# Patient Record
Sex: Female | Born: 1950 | Race: Black or African American | Hispanic: No | Marital: Single | Smoking: Former smoker
Health system: Southern US, Community
[De-identification: ages and names within clinical notes are randomized; demographics above are authoritative.]

## PROBLEM LIST (undated history)

## (undated) DIAGNOSIS — Z5111 Encounter for antineoplastic chemotherapy: Secondary | ICD-10-CM

## (undated) DIAGNOSIS — G8929 Other chronic pain: Secondary | ICD-10-CM

## (undated) DIAGNOSIS — IMO0001 Reserved for inherently not codable concepts without codable children: Secondary | ICD-10-CM

## (undated) DIAGNOSIS — J189 Pneumonia, unspecified organism: Secondary | ICD-10-CM

## (undated) DIAGNOSIS — D332 Benign neoplasm of brain, unspecified: Secondary | ICD-10-CM

## (undated) DIAGNOSIS — F329 Major depressive disorder, single episode, unspecified: Secondary | ICD-10-CM

## (undated) DIAGNOSIS — R51 Headache: Secondary | ICD-10-CM

## (undated) DIAGNOSIS — I214 Non-ST elevation (NSTEMI) myocardial infarction: Secondary | ICD-10-CM

## (undated) DIAGNOSIS — R519 Headache, unspecified: Secondary | ICD-10-CM

## (undated) DIAGNOSIS — K219 Gastro-esophageal reflux disease without esophagitis: Secondary | ICD-10-CM

## (undated) DIAGNOSIS — C3490 Malignant neoplasm of unspecified part of unspecified bronchus or lung: Secondary | ICD-10-CM

## (undated) DIAGNOSIS — D649 Anemia, unspecified: Secondary | ICD-10-CM

## (undated) DIAGNOSIS — M1990 Unspecified osteoarthritis, unspecified site: Secondary | ICD-10-CM

## (undated) DIAGNOSIS — I2510 Atherosclerotic heart disease of native coronary artery without angina pectoris: Secondary | ICD-10-CM

## (undated) DIAGNOSIS — I10 Essential (primary) hypertension: Secondary | ICD-10-CM

## (undated) DIAGNOSIS — M25559 Pain in unspecified hip: Secondary | ICD-10-CM

## (undated) DIAGNOSIS — J449 Chronic obstructive pulmonary disease, unspecified: Secondary | ICD-10-CM

## (undated) DIAGNOSIS — Z9981 Dependence on supplemental oxygen: Secondary | ICD-10-CM

## (undated) HISTORY — DX: Unspecified osteoarthritis, unspecified site: M19.90

## (undated) HISTORY — DX: Encounter for antineoplastic chemotherapy: Z51.11

## (undated) HISTORY — PX: CHOLECYSTECTOMY: SHX55

## (undated) HISTORY — DX: Headache, unspecified: R51.9

## (undated) HISTORY — PX: COLONOSCOPY: SHX174

## (undated) HISTORY — DX: Other chronic pain: G89.29

## (undated) HISTORY — DX: Headache: R51

---

## 2001-03-06 ENCOUNTER — Encounter: Payer: Self-pay | Admitting: Internal Medicine

## 2001-03-06 ENCOUNTER — Emergency Department (HOSPITAL_COMMUNITY): Admission: EM | Admit: 2001-03-06 | Discharge: 2001-03-06 | Payer: Self-pay | Admitting: Internal Medicine

## 2001-03-09 ENCOUNTER — Encounter: Payer: Self-pay | Admitting: Internal Medicine

## 2001-03-09 ENCOUNTER — Ambulatory Visit (HOSPITAL_COMMUNITY): Admission: RE | Admit: 2001-03-09 | Discharge: 2001-03-09 | Payer: Self-pay | Admitting: Internal Medicine

## 2001-04-01 ENCOUNTER — Inpatient Hospital Stay (HOSPITAL_COMMUNITY): Admission: RE | Admit: 2001-04-01 | Discharge: 2001-04-04 | Payer: Self-pay | Admitting: General Surgery

## 2001-04-02 ENCOUNTER — Encounter: Payer: Self-pay | Admitting: General Surgery

## 2001-05-17 ENCOUNTER — Ambulatory Visit (HOSPITAL_COMMUNITY): Admission: RE | Admit: 2001-05-17 | Discharge: 2001-05-17 | Payer: Self-pay | Admitting: Family Medicine

## 2001-05-17 ENCOUNTER — Encounter: Payer: Self-pay | Admitting: Family Medicine

## 2001-07-24 ENCOUNTER — Emergency Department (HOSPITAL_COMMUNITY): Admission: EM | Admit: 2001-07-24 | Discharge: 2001-07-24 | Payer: Self-pay | Admitting: Emergency Medicine

## 2001-08-18 ENCOUNTER — Emergency Department (HOSPITAL_COMMUNITY): Admission: EM | Admit: 2001-08-18 | Discharge: 2001-08-18 | Payer: Self-pay | Admitting: Emergency Medicine

## 2002-02-01 ENCOUNTER — Emergency Department (HOSPITAL_COMMUNITY): Admission: EM | Admit: 2002-02-01 | Discharge: 2002-02-01 | Payer: Self-pay | Admitting: Emergency Medicine

## 2002-06-25 ENCOUNTER — Emergency Department (HOSPITAL_COMMUNITY): Admission: EM | Admit: 2002-06-25 | Discharge: 2002-06-25 | Payer: Self-pay | Admitting: Emergency Medicine

## 2002-06-25 ENCOUNTER — Encounter: Payer: Self-pay | Admitting: Emergency Medicine

## 2002-09-24 ENCOUNTER — Encounter: Payer: Self-pay | Admitting: *Deleted

## 2002-09-24 ENCOUNTER — Emergency Department (HOSPITAL_COMMUNITY): Admission: EM | Admit: 2002-09-24 | Discharge: 2002-09-24 | Payer: Self-pay | Admitting: *Deleted

## 2003-04-15 HISTORY — PX: TUMOR REMOVAL: SHX12

## 2003-06-12 ENCOUNTER — Emergency Department (HOSPITAL_COMMUNITY): Admission: EM | Admit: 2003-06-12 | Discharge: 2003-06-12 | Payer: Self-pay | Admitting: Emergency Medicine

## 2003-06-22 ENCOUNTER — Inpatient Hospital Stay (HOSPITAL_COMMUNITY): Admission: EM | Admit: 2003-06-22 | Discharge: 2003-06-25 | Payer: Self-pay | Admitting: Emergency Medicine

## 2003-07-10 ENCOUNTER — Ambulatory Visit (HOSPITAL_COMMUNITY): Admission: RE | Admit: 2003-07-10 | Discharge: 2003-07-10 | Payer: Self-pay | Admitting: Family Medicine

## 2004-09-22 ENCOUNTER — Emergency Department (HOSPITAL_COMMUNITY): Admission: EM | Admit: 2004-09-22 | Discharge: 2004-09-22 | Payer: Self-pay | Admitting: Emergency Medicine

## 2005-01-24 ENCOUNTER — Ambulatory Visit (HOSPITAL_COMMUNITY): Admission: RE | Admit: 2005-01-24 | Discharge: 2005-01-24 | Payer: Self-pay | Admitting: Family Medicine

## 2005-04-22 ENCOUNTER — Ambulatory Visit (HOSPITAL_COMMUNITY): Admission: RE | Admit: 2005-04-22 | Discharge: 2005-04-22 | Payer: Self-pay | Admitting: Family Medicine

## 2005-05-12 ENCOUNTER — Emergency Department (HOSPITAL_COMMUNITY): Admission: EM | Admit: 2005-05-12 | Discharge: 2005-05-12 | Payer: Self-pay | Admitting: Emergency Medicine

## 2005-06-19 ENCOUNTER — Ambulatory Visit (HOSPITAL_COMMUNITY): Admission: RE | Admit: 2005-06-19 | Discharge: 2005-06-19 | Payer: Self-pay | Admitting: Family Medicine

## 2006-02-10 ENCOUNTER — Emergency Department (HOSPITAL_COMMUNITY): Admission: EM | Admit: 2006-02-10 | Discharge: 2006-02-10 | Payer: Self-pay | Admitting: Emergency Medicine

## 2006-02-19 ENCOUNTER — Ambulatory Visit (HOSPITAL_COMMUNITY): Admission: RE | Admit: 2006-02-19 | Discharge: 2006-02-19 | Payer: Self-pay | Admitting: Gastroenterology

## 2006-02-19 ENCOUNTER — Ambulatory Visit: Payer: Self-pay | Admitting: Gastroenterology

## 2006-06-21 ENCOUNTER — Emergency Department (HOSPITAL_COMMUNITY): Admission: EM | Admit: 2006-06-21 | Discharge: 2006-06-21 | Payer: Self-pay | Admitting: Emergency Medicine

## 2006-10-14 ENCOUNTER — Ambulatory Visit (HOSPITAL_COMMUNITY): Admission: RE | Admit: 2006-10-14 | Discharge: 2006-10-14 | Payer: Self-pay | Admitting: Family Medicine

## 2007-01-24 ENCOUNTER — Emergency Department (HOSPITAL_COMMUNITY): Admission: EM | Admit: 2007-01-24 | Discharge: 2007-01-24 | Payer: Self-pay | Admitting: Emergency Medicine

## 2007-03-27 ENCOUNTER — Emergency Department (HOSPITAL_COMMUNITY): Admission: EM | Admit: 2007-03-27 | Discharge: 2007-03-28 | Payer: Self-pay | Admitting: Emergency Medicine

## 2007-07-07 ENCOUNTER — Ambulatory Visit (HOSPITAL_COMMUNITY): Admission: RE | Admit: 2007-07-07 | Discharge: 2007-07-07 | Payer: Self-pay | Admitting: Family Medicine

## 2007-11-08 ENCOUNTER — Ambulatory Visit (HOSPITAL_COMMUNITY): Admission: RE | Admit: 2007-11-08 | Discharge: 2007-11-08 | Payer: Self-pay | Admitting: Family Medicine

## 2007-11-16 ENCOUNTER — Ambulatory Visit (HOSPITAL_COMMUNITY): Admission: RE | Admit: 2007-11-16 | Discharge: 2007-11-16 | Payer: Self-pay | Admitting: Family Medicine

## 2007-12-21 ENCOUNTER — Other Ambulatory Visit: Payer: Self-pay

## 2007-12-21 ENCOUNTER — Inpatient Hospital Stay: Payer: Self-pay | Admitting: Internal Medicine

## 2008-06-06 ENCOUNTER — Ambulatory Visit (HOSPITAL_COMMUNITY): Admission: RE | Admit: 2008-06-06 | Discharge: 2008-06-06 | Payer: Self-pay | Admitting: Family Medicine

## 2008-08-15 ENCOUNTER — Ambulatory Visit (HOSPITAL_COMMUNITY): Admission: RE | Admit: 2008-08-15 | Discharge: 2008-08-15 | Payer: Self-pay | Admitting: Family Medicine

## 2008-09-19 ENCOUNTER — Ambulatory Visit: Payer: Self-pay

## 2009-04-27 ENCOUNTER — Emergency Department (HOSPITAL_COMMUNITY): Admission: EM | Admit: 2009-04-27 | Discharge: 2009-04-27 | Payer: Self-pay | Admitting: Emergency Medicine

## 2009-09-14 ENCOUNTER — Emergency Department (HOSPITAL_COMMUNITY): Admission: EM | Admit: 2009-09-14 | Discharge: 2009-09-14 | Payer: Self-pay | Admitting: Emergency Medicine

## 2010-05-05 ENCOUNTER — Encounter: Payer: Self-pay | Admitting: Family Medicine

## 2010-06-13 ENCOUNTER — Emergency Department (HOSPITAL_COMMUNITY): Payer: Medicare Other

## 2010-06-13 ENCOUNTER — Emergency Department (HOSPITAL_COMMUNITY)
Admission: EM | Admit: 2010-06-13 | Discharge: 2010-06-13 | Disposition: A | Discharge status: Home / Self Care | Payer: Medicare Other | Attending: Emergency Medicine | Admitting: Emergency Medicine

## 2010-06-13 DIAGNOSIS — Z79899 Other long term (current) drug therapy: Secondary | ICD-10-CM | POA: Insufficient documentation

## 2010-06-13 DIAGNOSIS — Z87891 Personal history of nicotine dependence: Secondary | ICD-10-CM | POA: Insufficient documentation

## 2010-06-13 DIAGNOSIS — I10 Essential (primary) hypertension: Secondary | ICD-10-CM | POA: Insufficient documentation

## 2010-06-13 DIAGNOSIS — R109 Unspecified abdominal pain: Secondary | ICD-10-CM | POA: Insufficient documentation

## 2010-06-13 DIAGNOSIS — J45909 Unspecified asthma, uncomplicated: Secondary | ICD-10-CM | POA: Insufficient documentation

## 2010-06-13 LAB — CBC
HCT: 40 % (ref 36.0–46.0)
Hemoglobin: 14 g/dL (ref 12.0–15.0)
MCHC: 35 g/dL (ref 30.0–36.0)
MCV: 81.6 fL (ref 78.0–100.0)
RDW: 13.9 % (ref 11.5–15.5)
WBC: 9.6 10*3/uL (ref 4.0–10.5)

## 2010-06-13 LAB — DIFFERENTIAL
Eosinophils Relative: 0 % (ref 0–5)
Lymphocytes Relative: 24 % (ref 12–46)
Lymphs Abs: 2.3 10*3/uL (ref 0.7–4.0)
Monocytes Absolute: 0.6 10*3/uL (ref 0.1–1.0)
Neutro Abs: 6.7 10*3/uL (ref 1.7–7.7)

## 2010-06-13 LAB — URINALYSIS, ROUTINE W REFLEX MICROSCOPIC
Bilirubin Urine: NEGATIVE
Ketones, ur: NEGATIVE mg/dL
Leukocytes, UA: NEGATIVE
Nitrite: NEGATIVE
Protein, ur: NEGATIVE mg/dL

## 2010-06-13 LAB — BASIC METABOLIC PANEL
CO2: 22 mEq/L (ref 19–32)
Calcium: 9.3 mg/dL (ref 8.4–10.5)
Chloride: 104 mEq/L (ref 96–112)
Creatinine, Ser: 0.86 mg/dL (ref 0.4–1.2)
Glucose, Bld: 136 mg/dL — ABNORMAL HIGH (ref 70–99)

## 2010-06-30 LAB — URINALYSIS, ROUTINE W REFLEX MICROSCOPIC
Bilirubin Urine: NEGATIVE
Glucose, UA: NEGATIVE mg/dL
Protein, ur: NEGATIVE mg/dL
Specific Gravity, Urine: 1.02 (ref 1.005–1.030)
Urobilinogen, UA: 0.2 mg/dL (ref 0.0–1.0)

## 2010-06-30 LAB — COMPREHENSIVE METABOLIC PANEL
ALT: 12 U/L (ref 0–35)
AST: 19 U/L (ref 0–37)
Albumin: 3.8 g/dL (ref 3.5–5.2)
Alkaline Phosphatase: 202 U/L — ABNORMAL HIGH (ref 39–117)
CO2: 25 mEq/L (ref 19–32)
Chloride: 109 mEq/L (ref 96–112)
Creatinine, Ser: 0.6 mg/dL (ref 0.4–1.2)
GFR calc Af Amer: >60 mL/min (ref >60)
GFR calc non Af Amer: >60 mL/min (ref >60)
Potassium: 3.8 mEq/L (ref 3.5–5.1)
Total Bilirubin: 0.6 mg/dL (ref 0.3–1.2)

## 2010-06-30 LAB — URINE MICROSCOPIC-ADD ON

## 2010-06-30 LAB — DIFFERENTIAL
Basophils Absolute: 0.1 10*3/uL (ref 0.0–0.1)
Basophils Relative: 1 % (ref 0–1)
Eosinophils Absolute: 0.1 10*3/uL (ref 0.0–0.7)
Eosinophils Relative: 1 % (ref 0–5)
Monocytes Absolute: 0.4 10*3/uL (ref 0.1–1.0)

## 2010-06-30 LAB — CBC
MCV: 86.2 fL (ref 78.0–100.0)
Platelets: 208 10*3/uL (ref 150–400)
RBC: 4.73 MIL/uL (ref 3.87–5.11)
WBC: 9.5 10*3/uL (ref 4.0–10.5)

## 2010-06-30 LAB — LIPASE, BLOOD: Lipase: 12 U/L (ref 11–59)

## 2010-07-01 LAB — URINALYSIS, ROUTINE W REFLEX MICROSCOPIC
Bilirubin Urine: NEGATIVE
Glucose, UA: NEGATIVE mg/dL
Ketones, ur: NEGATIVE mg/dL
Leukocytes, UA: NEGATIVE
Protein, ur: NEGATIVE mg/dL
pH: 5.5 (ref 5.0–8.0)

## 2010-07-01 LAB — URINE MICROSCOPIC-ADD ON

## 2010-08-30 NOTE — H&P (Signed)
NAMEANNALISA, COLONNA                         ACCOUNT NO.:  [id number]   MEDICAL RECORD NO.:  [id number]                   PATIENT TYPE:  INP   LOCATION:  A223                                 FACILITY:  [facility]   PHYSICIAN:  Tesfaye D. Felecia Shelling, M.D.              DATE OF BIRTH:  09/27/1950   DATE OF ADMISSION:  06/22/2003  DATE OF DISCHARGE:                                HISTORY & PHYSICAL   CHIEF COMPLAINT:  1. Cough.  2. Shortness of breath.  3. Wheezing.   HISTORY OF PRESENT ILLNESS:  This is a 60 year old female patient who  presented to the emergency room with the above complaints.  The patient was  in her usual state of health until three days back when she developed cough  and congestion.  This was followed by shortness of breath and wheezing.  The  patient had also intermittent fever.  He symptoms progressively got worse.  The patient started having soreness in her chest and abdomen from recurrent  cough.  She was then brought to the emergency room where she was evaluated.  The patient was found to have severe hypoxemia.  Her chest x-ray showed some  atelectasis and sign of bronchitis.  The patient is a smoker for the last 28  hours. However, she has never been diagnosed as a case of emphysema or  chronic obstructive pulmonary disease.   REVIEW OF SYSTEMS:  The patient has a headache, generalized weakness, nausea  and abdominal pain.  No chest pain, palpitations, dysuria, urgency or  frequency of urination.  No leg edema.   PAST MEDICAL HISTORY:  1. 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  The patient does not have knowledge of the detail of     the condition.  However, she was told it to be a benign tumor.  2. History of cholecystectomy in 2002.   CURRENT MEDICATIONS:  The patient is taking Vicodin one tablet p.o. 4h.  p.r.n. for pain.   SOCIAL HISTORY:  The patient is a Location manager.  She is single.  She has  two  children.  The patient reports smoking about 1 to 1/2 packs of  cigarettes for the last 28 years.  No history of alcohol, or substance  abuse.   PHYSICAL EXAMINATION:  GENERAL:  The patient is alert, awake, acutely sick  looking.  VITAL SIGNS:  Blood pressure 105/63, pulse 109, respiratory rate 28,  temperature 98.2 degrees Fahrenheit.  HEENT:  Pupils are equal and reactive.  NECK:  Supple.  CHEST:  Poor air entry, bilateral expiratory wheezes and rhonchi.  There are  a few crackles at the bases.  CARDIOVASCULAR:  First and second heart sound is heart.  Regular, and  tachycardia.  ABDOMEN:  Soft and relaxed.  Bowel sounds positive.  No masses, no  organomegaly.  EXTREMITIES:  No leg edema.  LABORATORY DATA ON ADMISSION:  ABG on room air:  pH 7.4, pCO2 34.7, pO2 63,  bicarbonate 22.  Oxygen saturation 93.7.  CBC:  WBC 3.4, hemoglobin 12.7,  hematocrit 37.2, and platelets 172,000.  Sodium 132.  Potassium 3.8.  Chloride 107.  Carbon dioxide 26, glucose 93, BUN 7, creatinine 0.7, and  calcium 8.7.   ASSESSMENT:  This is a 60 year old female patient who is a smoker, who  presented with cough, shortness of breath, wheezing and generalized body  pain.  The patient does have significant hypoxemia on her blood gas.  She  has no leukocytosis, and her electrolytes are normal.  The patient probably  has acute bronchitis versus chronic obstructive pulmonary disease with  exacerbation.  This could have been exacerbated by her viral syndrome.   PLAN:  1. We will continue the patient on nebulizer treatment.  2. Would continue oxygen, two liters nasal cannula.  3. Would give the patient IV Solu-Medrol and IV antibiotics.     ___________________________________________                                         Eustaquio Maize Felecia Shelling, M.D.   TDF/MEDQ  D:  06/22/2003  T:  06/22/2003  Job:  [id number]

## 2010-08-30 NOTE — Op Note (Signed)
[facility]  Patient:    Courtney Zuniga, Courtney Zuniga Visit Number: [id number] MRN: [id number]          Service Type: SUR Location: 3A A326 01 Attending Physician:  Dessa Phi Dictated by:   Elpidio Anis, M.D. Admit Date:  04/01/2001 Discharge Date: 04/04/2001   CC:         [facility], [place], [place]   Operative Report  PREOPERATIVE DIAGNOSIS:  Chronic cholecystitis.  POSTOPERATIVE DIAGNOSES:  Chronic cholecystitis, multiple uterine fibroid.  PROCEDURE:  Laparoscopic cholecystectomy.  SURGEON:  Elpidio Anis, M.D.  ANESTHESIA:  General anesthesia.  DESCRIPTION OF PROCEDURE:  Under general anesthesia, the patients abdomen was prepped and draped in a sterile field.  Supraumbilical midline incision was made.  Veress needle was inserted uneventfully.  Abdomen was insufflated with 3 L of CO2.  Using a Visiport guide, a 10-mm port was placed.  Laparoscope was placed uneventfully and gallbladder was visualized.  Patient was positioned in reversed Trendelenburg position.  The right upper quadrant incisions were made and a 10-mm port and two 5-mm ports were placed under videoscopic guidance. Gallbladder was grasped and was positioned so as to gain access to the cystic duct.  Cystic duct was dissected without difficulty.  It was clipped with four clips and divided.  Cystic artery was dissected, clipped with three clips and divided.  Using electrocautery, gallbladder was separated from the infrahepatic bed without difficulty.  Gallbladder was grasped and retrieved. Irrigation was carried out.  There was no bile leak and no bleeding from the bed of the gallbladder.  The patient was placed in deep Trendelenburg position.  Evaluation of the pelvis revealed enlarged, multilobulated uterus with inflammation and adhesions to the surface of the uterus from massive fibroids.  Ovaries appeared to be normal.  These fibroids were probably because of some of  her pelvic pain.  Patient was placed in regular position. CO2 was allowed to escape from the abdomen and the ports were removed.  The incisions were closed using 0 Dexon on the fascia and staples on the skin. She was awakened from anesthesia uneventfully, transferred to a bed and taken to the postanesthetic care unit. Dictated by:   Elpidio Anis, M.D.  Attending Physician:  Dessa Phi DD:  04/03/01 TD:  04/05/01 Job: [id number]

## 2010-08-30 NOTE — Discharge Summary (Signed)
Courtney Zuniga, Courtney Zuniga                         ACCOUNT NO.:  [id number]   MEDICAL RECORD NO.:  [id number]                   PATIENT TYPE:  INP   LOCATION:  A311                                 FACILITY:  [facility]   PHYSICIAN:  Hanley Hays. Dechurch, M.D.           DATE OF BIRTH:  [birth year]   DATE OF ADMISSION:  06/22/2003  DATE OF DISCHARGE:  06/25/2003                                 DISCHARGE SUMMARY   DIAGNOSES:  1. Asthma exacerbation.  2. Probable viral syndrome.  3. Blepharospasm.  4. Status post brain tumor excision, January 2005, details __________.  5. Status post endonasal transsphenoidal resection of pituitary tumor,     May 15, 2003.  6. Tobacco abuse.  7. Decreased TSH, free T4; free T4 pending.   DISPOSITION:  Patient discharged to home.   MEDICATIONS:  1. Prednisone 40 mg x2 days, 20 mg x3 days, 10 mg x3 days then stop.  2. Advair 250/50 mcg 1 puff b.i.d.  3. Mucinex DM 600 mg t.i.d. x1 week then p.r.n.  4. Ceftin 250 mg b.i.d. until gone to complete a 7-day course.  5. The patient is instructed to continue her ibuprofen and Vicodin as needed     for pain.   FOLLOWUP:  She is to follow up with Dr. Vickey Huger DonDiego later this  week, number is provided for her to schedule an appointment; follow up with  Dr. Rennis Harding at [facility] as scheduled.   HOSPITAL COURSE:  [age]-year-old African American female who presented  to the emergency room complaining of progressive shortness of breath and  wheezing as well as pain in her chest.  She was noted to be severely  hypoxemic with a PO2 of 63% on room air and interesting, it was 59% on 2 L.  She was in moderate respiratory distress.  She had no evidence of  hypercapnia or change in mental status.  White count was slightly low at 3.4  with normal differential.  Electrolytes were within normal limits.  The  patient had an elevated D-dimer of 1.3.  She had a history of a recent brain  tumor  excision.  A CT scan was performed to rule out PE, which did not show  PE, but only changes consistent with her COPD and some bibasilar  atelectasis.  Over the course of the next 48 hours, the patient had  significant improvement in her respiratory status.  The patient had returned  to her baseline by day 3 and it was felt that she could complete her  management as an outpatient.  The patient was also noted to have persistent  blepharospasm.  She should be referred as an outpatient for question if she  is a good candidate for Botox injection, as this is quite bothersome to her  vision.  The patient also is supposed to have a followup with Dr. Rennis Harding  within 4 weeks; this going to need to  be followed as well.  She is being  discharged to home in stable condition.  At the time of discharge, she is  alert and pleasant.  Blood pressure is 132/72, pulse of 76 and regular,  respirations are unlabored.  O2 saturations are pending on room air.  The  lungs are diminished but no wheezing is noted, no rales or  rhonchi.  Heart is regular.  No murmur.  The abdomen is soft, flat and  nontender.  Extremities without clubbing, cyanosis or edema.  She has noted  blepharospasm of the left eye but no other changes on her neurologic exam.  She is being discharged to home with a followup as noted above.     ___________________________________________                                         Hanley Hays. Josefine Class, M.D.   FED/MEDQ  D:  06/25/2003  T:  06/26/2003  Job:  [id number]   cc:   Melvyn Novas, MD  Fax: [phone]   [facility] Dr. Rennis Harding

## 2010-08-30 NOTE — H&P (Signed)
[facility]  Patient:    Courtney Zuniga, Courtney Zuniga Visit Number: [id number] MRN: [id number]          Service Type: SUR Location: 3A A326 01 Attending Physician:  Dessa Phi Dictated by:   Elpidio Anis, M.D. Admit Date:  04/01/2001                           History and Physical  HISTORY OF PRESENT ILLNESS:  A 60 year old female with a history of recurrent abdominal pain for greater than three months.  She has had heartburn and epigastric pain which radiates through to her back.  She has had nausea with vomiting.  She has had a 12-pound weight loss.  She has had episodes of diarrhea over the past three weeks.  Patient has known gallstones.  She was scheduled to have elective cholecystectomy but had severe pain which is uncontrolled by oral analgesics.  She is admitted and will have cholecystectomy in the morning.  PAST MEDICAL HISTORY:  She has no chronic illness other than as noted above. She takes no medications.  She has had no prior surgery.  FAMILY HISTORY:  Positive for cancer of the ovaries in her mother.  REVIEW OF SYSTEMS:  She has had weight loss, increased dietary postprandial discomfort, and GI symptoms.  She is postmenopausal since age 56.  She has occasional episodes of leg and foot swelling.  PHYSICAL EXAMINATION:  VITAL SIGNS:  Blood pressure 124/86, pulse 90, respirations 18, weight 174 pounds.  HEENT:  Unremarkable.  NECK:  Supple without JVD or bruit.  CHEST:  Clear to auscultation.  No rales, rubs, or wheezes.  HEART:  Regular rate and rhythm without murmur, gallop, or rub.  ABDOMEN:  Soft with epigastric and right upper quadrant tenderness.  She also has suprapubic tenderness.  RECTAL:  Normal.  No masses are felt.  EXTREMITIES:  No cyanosis, clubbing, or edema.  NEUROLOGIC:  No focal, motor, sensory, or cerebellar deficits.  IMPRESSION: 1. Cholelithiasis with cholecystitis. 2. Lower pelvic pain.  PLAN:  Diagnostic  laparoscopy with laparoscopic cholecystectomy. Dictated by:   Elpidio Anis, M.D. Attending Physician:  Dessa Phi DD:  04/01/01 TD:  04/01/01 Job: [id number]

## 2010-08-30 NOTE — Op Note (Signed)
Courtney Zuniga, Courtney Zuniga             ACCOUNT NO.:  [id number]   MEDICAL RECORD NO.:  [id number]          PATIENT TYPE:  AMB   LOCATION:  DAY                           FACILITY:  [facility]   PHYSICIAN:  Kassie Mends, M.D.      DATE OF BIRTH:  1950/05/05   DATE OF PROCEDURE:  02/19/2006  DATE OF DISCHARGE:                                 OPERATIVE REPORT   PROCEDURE:  Colonoscopy.   INDICATION FOR EXAM:  Ms. Ratledge is a 60 year old female who presents for  average risk colon cancer screening.   FINDINGS:  Small internal hemorrhoids.  Tortuous sigmoid colon.  Otherwise,  normal colon without evidence of polyps, diverticula, masses, inflammatory  changes, or vascular ectasias.   RECOMMENDATIONS:  1. High fiber diet.  Handout given on high fiber diet and management of      hemorrhoids.  2. Follow up with Dr. Janna Arch.  3. Screening colonoscopy in ten years.   MEDICATIONS:  1. Demerol 50 mg IV.  2. Versed 5 mg IV.   PROCEDURE TECHNIQUE:  Physical exam was performed.  Informed consent was  obtained from the patient after explaining the benefits, risks and  alternatives to the procedure.  The patient was connected to the monitor and  placed in the left lateral position.  Continuous oxygen was provided by  nasal cannula and IV medicine administered through an indwelling cannula.  After ministration of sedation and rectal exam, the patient's rectum was  intubated and the scope was advanced under direct visualization to the  cecum.  The scope was subsequently removed slowly by carefully examining the  color, texture, anatomy, and integrity of the mucosa on the way out.  The  patient was recovered in the endoscopy suite and discharged to home in  satisfactory condition.      Kassie Mends, M.D.  Electronically Signed     SM/MEDQ  D:  02/19/2006  T:  02/19/2006  Job:  [id number]   cc:   Melvyn Novas, MD  Fax: [phone]

## 2010-08-30 NOTE — Discharge Summary (Signed)
[facility]  Patient:    Courtney Zuniga, Courtney Zuniga Visit Number: [id number] MRN: [id number]          Service Type: OUT Location: RAD Attending Physician:  Elnora Morrison Dictated by:   Elpidio Anis, M.D. Adm. Date:  04/01/01 Disc. Date: 04/04/01                             Discharge Summary  DISCHARGE DIAGNOSES: 1. Chronic cholecystectomy. 2. Multiple uterine fibroids.  PROCEDURE:  Laparoscopic cholecystectomy April 03, 2001.  DISPOSITION:  The patient is discharged home.  CONDITION ON DISCHARGE:  Stable and satisfactory.  DISCHARGE MEDICATIONS:  Tylox 1 every four hours as needed for pain.  FOLLOW-UP:  The patient is scheduled to be seen in the office on April 12, 2001.  HISTORY OF PRESENT ILLNESS:  The patient is a 60 year old female with a history of recurrent abdominal pain for greater than three months with episodes of heartburn, epigastric pain which radiate through to her back, nausea with vomiting, and a 12-pound weight loss.  She had a history of gallstones.  The patient was having severe pain and was admitted for treatment.  It was felt that she would need to have cholecystectomy.  PAST MEDICAL HISTORY:  Otherwise unremarkable.  PHYSICAL EXAMINATION:  Epigastric and right upper quadrant tenderness with some suprapubic tenderness.  HOSPITAL COURSE:  She was scheduled for surgery, but we could not get her surgery done initially.  EGD was done because of recurrent upper symptoms. EGD showed acute and chronic gastritis.  Later that day, laparoscopic cholecystectomy was done.  She had chronic adhesions to the gallbladder.  She also had an enlarged uterus which was multilobulated.  This was due to multiple leiomyomas.  Her ovaries appeared to be normal.  She did well in the postoperative period, had no difficulty, and was discharged in asymptomatic condition on the first postoperative day. Dictated by:   Elpidio Anis, M.D. Attending  Physician:  Elnora Morrison DD:  05/15/01 TD:  05/16/01 Job: [id number]

## 2010-10-19 ENCOUNTER — Emergency Department (HOSPITAL_COMMUNITY)
Admission: EM | Admit: 2010-10-19 | Discharge: 2010-10-19 | Disposition: A | Discharge status: Home / Self Care | Payer: Medicare Other | Attending: Emergency Medicine | Admitting: Emergency Medicine

## 2010-10-19 ENCOUNTER — Emergency Department (HOSPITAL_COMMUNITY): Payer: Medicare Other

## 2010-10-19 ENCOUNTER — Encounter: Payer: Self-pay | Admitting: Emergency Medicine

## 2010-10-19 DIAGNOSIS — R110 Nausea: Secondary | ICD-10-CM | POA: Insufficient documentation

## 2010-10-19 DIAGNOSIS — R51 Headache: Secondary | ICD-10-CM | POA: Insufficient documentation

## 2010-10-19 DIAGNOSIS — F17200 Nicotine dependence, unspecified, uncomplicated: Secondary | ICD-10-CM | POA: Insufficient documentation

## 2010-10-19 HISTORY — DX: Benign neoplasm of brain, unspecified: D33.2

## 2010-10-19 LAB — POCT I-STAT, CHEM 8
BUN: 10 mg/dL (ref 6–23)
Hemoglobin: 13.9 g/dL (ref 12.0–15.0)
Potassium: 3.4 mEq/L — ABNORMAL LOW (ref 3.5–5.1)
Sodium: 145 mEq/L (ref 135–145)
TCO2: 23 mmol/L (ref 0–100)

## 2010-10-19 MED ORDER — MORPHINE SULFATE 4 MG/ML IJ SOLN
4.0000 mg | Freq: Once | INTRAMUSCULAR | Status: AC
  Administered 2010-10-19: 4 mg via INTRAVENOUS
  Filled 2010-10-19: qty 1

## 2010-10-19 MED ORDER — BUTALBITAL-ASA-CAFFEINE 50-325-40 MG PO CAPS: 1.0000 | ORAL_CAPSULE | Freq: Two times a day (BID) | ORAL | Status: AC | PRN

## 2010-10-19 MED ORDER — METOCLOPRAMIDE HCL 5 MG/ML IJ SOLN
10.0000 mg | Freq: Once | INTRAMUSCULAR | Status: AC
  Administered 2010-10-19: 10 mg via INTRAVENOUS
  Filled 2010-10-19: qty 2

## 2010-10-19 NOTE — ED Notes (Signed)
Pt stating pain free, restingin bed in room drinking a sprite

## 2010-10-19 NOTE — ED Notes (Addendum)
Headache x 1 week. Nausea x 2 wks. No s/s of stroke present. States pcp took pt off bp med x 1 year ago

## 2010-10-19 NOTE — ED Provider Notes (Signed)
History     Chief Complaint  Patient presents with  . Headache   HPI Comments: Pt reports h/o brain tumor that was removed and has had occasional similar headaches since; they have been worsening x 8 days, more frequent and severe. States she wakes up through the night with a "gripping pain," also describes it as sharp, starting in her forehead and then moves to the back of her head. Pain is worse with loud noise and light. +nausea but no other complaints. No numbness, weakness, tingling, dizziness, facial droop, or vision change. No cp, sob, f/c, cough, congestion, abd pain, or v/d.   Patient is a 60 y.o. female presenting with headaches. The history is provided by the patient.  Headache  This is a recurrent problem. The current episode started more than 1 week ago. The problem has been gradually worsening. The headache is associated with loud noise and bright light. The pain is located in the frontal and occipital region. The quality of the pain is described as sharp. The pain is severe. Radiates to: ears. Associated symptoms include nausea. Pertinent negatives include no fever, no malaise/fatigue, no chest pressure, no palpitations, no syncope, no shortness of breath and no vomiting. Associated symptoms comments: Hand numbness.    Past Medical History  Diagnosis Date  . Brain tumor (benign)     Past Surgical History  Procedure Date  . Cholecystectomy   . Tumor removal     History reviewed. No pertinent family history.  History  Substance Use Topics  . Smoking status: Current Everyday Smoker  . Smokeless tobacco: Not on file  . Alcohol Use: No    OB History    Grav Para Term Preterm Abortions TAB SAB Ect Mult Living                  Review of Systems  Constitutional: Negative for fever and malaise/fatigue.  Respiratory: Negative for shortness of breath.   Cardiovascular: Negative for palpitations and syncope.  Gastrointestinal: Positive for nausea. Negative for vomiting.   Neurological: Positive for headaches.  All other systems reviewed and are negative.    Physical Exam  BP 150/100  Pulse 66  Temp(Src) 98.2 F (36.8 C) (Oral)  Resp 20  Ht 5\' 7"  (1.702 m)  Wt 160 lb (72.576 kg)  BMI 25.06 kg/m2  SpO2 99%  Physical Exam  Nursing note and vitals reviewed. Constitutional: She is oriented to person, place, and time. She appears well-developed and well-nourished. No distress.       Calm  HENT:  Head: Normocephalic and atraumatic.  Eyes: Conjunctivae are normal. No scleral icterus.       Normal appearance  Neck: Neck supple.  Cardiovascular: Normal rate, regular rhythm and normal heart sounds.   Pulmonary/Chest: Effort normal and breath sounds normal.  Abdominal: Soft. There is no tenderness.  Musculoskeletal: She exhibits no edema and no tenderness.  Neurological: She is alert and oriented to person, place, and time. She has normal strength. No cranial nerve deficit or sensory deficit. She exhibits normal muscle tone. Coordination normal.       Episodic  facial muscle spasm; no facial droop; Normal speech; bilateral leg strength 5/5; bilateral upper and lower extremity normal light touch sensation;  Skin:       Color normal  Psychiatric: She has a normal mood and affect.    ED Course  Procedures  MDM Pt is feeling better after treatment.  Labs and xrays without acute changes.  No sign of  tumor recurrence.  Possible migraine headache.  No sign of meningitis, SAH.  I personally performed the services described in this documentation, which was scribed in my presence.  The recorded information has been reviewed and considered.    Celene Kras, MD 10/19/10 [phone]

## 2010-10-19 NOTE — ED Notes (Signed)
Pt restingin bed in room lights turned off call light at hand

## 2010-12-28 ENCOUNTER — Emergency Department (HOSPITAL_COMMUNITY): Payer: Medicare Other

## 2010-12-28 ENCOUNTER — Encounter (HOSPITAL_COMMUNITY): Payer: Self-pay | Admitting: *Deleted

## 2010-12-28 ENCOUNTER — Emergency Department (HOSPITAL_COMMUNITY)
Admission: EM | Admit: 2010-12-28 | Discharge: 2010-12-28 | Disposition: A | Discharge status: Home / Self Care | Payer: Medicare Other | Attending: Emergency Medicine | Admitting: Emergency Medicine

## 2010-12-28 DIAGNOSIS — J45909 Unspecified asthma, uncomplicated: Secondary | ICD-10-CM | POA: Insufficient documentation

## 2010-12-28 DIAGNOSIS — M25551 Pain in right hip: Secondary | ICD-10-CM

## 2010-12-28 DIAGNOSIS — M25559 Pain in unspecified hip: Secondary | ICD-10-CM | POA: Insufficient documentation

## 2010-12-28 DIAGNOSIS — R109 Unspecified abdominal pain: Secondary | ICD-10-CM | POA: Insufficient documentation

## 2010-12-28 DIAGNOSIS — F17200 Nicotine dependence, unspecified, uncomplicated: Secondary | ICD-10-CM | POA: Insufficient documentation

## 2010-12-28 DIAGNOSIS — I10 Essential (primary) hypertension: Secondary | ICD-10-CM | POA: Insufficient documentation

## 2010-12-28 HISTORY — DX: Essential (primary) hypertension: I10

## 2010-12-28 MED ORDER — HYDROCODONE-ACETAMINOPHEN 5-325 MG PO TABS: 1.0000 | ORAL_TABLET | ORAL | Status: AC | PRN

## 2010-12-28 MED ORDER — NAPROXEN 500 MG PO TABS: 500.0000 mg | ORAL_TABLET | Freq: Two times a day (BID) | ORAL | Status: DC

## 2010-12-28 MED ORDER — PREDNISONE 20 MG PO TABS: ORAL_TABLET | ORAL | Status: AC

## 2010-12-28 MED ORDER — HYDROMORPHONE HCL 2 MG/ML IJ SOLN
2.0000 mg | Freq: Once | INTRAMUSCULAR | Status: AC
  Administered 2010-12-28: 2 mg via INTRAMUSCULAR
  Filled 2010-12-28: qty 1

## 2010-12-28 NOTE — ED Notes (Signed)
Pt c/o pain in her right hip  X 1 week. Pt states that she has had problems with her hip prior to this episode. Pt c/o pain with movement and putting pressure on her leg. Alert and oriented x 3. Skin warm and dry. Pt grimacing with pain with movement. Able to bear weight but c/o increasing pain.

## 2010-12-28 NOTE — ED Provider Notes (Signed)
History     CSN: [id number] Arrival date & time: 12/28/2010  4:49 PM Scribed for Donnetta Hutching, MD, the patient was seen in room APA11/APA11. This chart was scribed by Katha Cabal. This patient's care was started at 5:21PM.     Chief Complaint  Patient presents with  . Hip Pain    HPI  Courtney Zuniga is a 60 y.o. female who presents to the Emergency Department "shooting" right inguinal area and right lateral hip pain that waxes and wanes since onset 5 years ago but has gotten worse in the last week.  Pain is increased with movement and standing.  Denies injury and fall. Pain is minimally  alleviated by Ibuprofen.   Patient states that she had to retire early because of hip pain.  Patient has not seen a bone specialist.   PCP [facility]   PAST MEDICAL HISTORY:  Past Medical History  Diagnosis Date  . Brain tumor (benign)   . Hypertension   . Asthma     PAST SURGICAL HISTORY:  Past Surgical History  Procedure Date  . Cholecystectomy   . Tumor removal     MEDICATIONS:  Previous Medications   IBUPROFEN (ADVIL,MOTRIN) 800 MG TABLET    Take 800 mg by mouth every 8 (eight) hours as needed.       ALLERGIES:  Allergies as of 12/28/2010  . (No Known Allergies)     FAMILY HISTORY:  History reviewed. No pertinent family history.   SOCIAL HISTORY: History   Social History  . Marital Status: Single    Spouse Name: N/A    Number of Children: N/A  . Years of Education: N/A   Social History Main Topics  . Smoking status: Current Everyday Smoker -- 0.3 packs/day  . Smokeless tobacco: None  . Alcohol Use: No  . Drug Use: No  . Sexually Active:    Other Topics Concern  . None   Social History Narrative  . None    Review of Systems 10 Systems reviewed and are negative for acute change except as noted in the HPI.  Physical Exam    BP 123/68  Pulse 81  Temp(Src) 98.4 F (36.9 C) (Oral)  Resp 24  Ht 5\' 7"  (1.702 m)  Wt 153 lb (69.4 kg)  BMI  23.96 kg/m2  SpO2 97%  Physical Exam  Nursing note and vitals reviewed. Constitutional: She is oriented to person, place, and time. No distress.       Appearance consistent with age of record  HENT:  Head: Normocephalic and atraumatic.  Right Ear: External ear normal.  Left Ear: External ear normal.  Nose: Nose normal.  Mouth/Throat: Oropharynx is clear and moist.  Eyes: Conjunctivae are normal.  Neck: Neck supple.  Cardiovascular: Normal rate and regular rhythm.  Exam reveals no gallop and no friction rub.   No murmur heard. Pulmonary/Chest: Effort normal and breath sounds normal. She has no wheezes. She has no rhonchi. She has no rales. She exhibits no tenderness.  Abdominal: Soft. There is no tenderness.  Musculoskeletal: Normal range of motion.       right inguinal area and right lateral hip tenderness  Pain with ROM of hip     Neurological: She is alert and oriented to person, place, and time. No sensory deficit.  Skin: No rash noted.       Color normal  Psychiatric: She has a normal mood and affect.    ED Course  Procedures  OTHER DATA REVIEWED:  Nursing notes, vital signs, and past medical records reviewed.   DIAGNOSTIC STUDIES: Oxygen Saturation is 99% on room air, normal by my interpretation.     LABS / RADIOLOGY:  Dg Hip Complete Right  12/28/2010  *RADIOLOGY REPORT*  Clinical Data: Chronic right hip pain, worse this week  RIGHT HIP - COMPLETE 2+ VIEW  Comparison: 06/06/2008  Findings: Diffuse osseous demineralization. Mild narrowing of the right hip joint. Left hip joint and symmetric SI joints preserved. No pelvic calcifications question phleboliths versus uterine leiomyomata. Mild degenerative changes of pubic symphysis. No acute fracture, dislocation or bone destruction.  IMPRESSION: No acute osseous abnormalities. Mild degenerative changes right hip joint, similar to prior study.  Original Report Authenticated By: Lollie Marrow, M.D.    ED COURSE /  COORDINATION OF CARE:  5:25 PM  Will order pain control and review hip xray.  Patient is to follow up with a orthopedist.     Orders Placed This Encounter  Procedures  . DG Hip Complete Right  . Crutches    OZH:[id number] chronic right hip/groin pain;  Xray neg acute;  rx pain;  Refer to ortho   IMPRESSION: Diagnoses that have been ruled out:  Diagnoses that are still under consideration:  Final diagnoses:  Right hip pain    PLAN:  Home Referral Orthopedics The patient is to return the emergency department if there is any worsening of symptoms. I have reviewed the discharge instructions with the patient.    CONDITION ON DISCHARGE: Good    DISCHARGE MEDICATIONS: New Prescriptions   HYDROCODONE-ACETAMINOPHEN (NORCO) 5-325 MG PER TABLET    Take 1-2 tablets by mouth every 4 (four) hours as needed for pain.   NAPROXEN (NAPROSYN) 500 MG TABLET    Take 1 tablet (500 mg total) by mouth 2 (two) times daily.   PREDNISONE (DELTASONE) 20 MG TABLET    3 tabs po day one, then 2 po daily x 4 days     I personally performed the services described in this documentation, which was scribed in my presence. The recorded information has been reviewed and considered. No att. providers found           Donnetta Hutching, MD 12/30/10 [phone]

## 2010-12-28 NOTE — ED Notes (Signed)
Pt states she has been hurting in her right hip for a week. Pt denies injury. Pt states it feels like her hip is broken. Pt walked into the ED.

## 2011-01-09 ENCOUNTER — Encounter: Payer: Self-pay | Admitting: Orthopedic Surgery

## 2011-01-09 ENCOUNTER — Ambulatory Visit (INDEPENDENT_AMBULATORY_CARE_PROVIDER_SITE_OTHER): Payer: Medicare Other | Admitting: Orthopedic Surgery

## 2011-01-09 VITALS — BP 140/90 | Ht 67.0 in | Wt 153.0 lb

## 2011-01-09 DIAGNOSIS — M5126 Other intervertebral disc displacement, lumbar region: Secondary | ICD-10-CM

## 2011-01-09 MED ORDER — GABAPENTIN 100 MG PO CAPS: 100.0000 mg | ORAL_CAPSULE | Freq: Three times a day (TID) | ORAL | Status: DC

## 2011-01-09 MED ORDER — HYDROCODONE-ACETAMINOPHEN 5-325 MG PO TABS: 1.0000 | ORAL_TABLET | ORAL | Status: DC | PRN

## 2011-01-09 MED ORDER — IBUPROFEN 800 MG PO TABS: 800.0000 mg | ORAL_TABLET | Freq: Three times a day (TID) | ORAL | Status: DC | PRN

## 2011-01-09 NOTE — Patient Instructions (Signed)
Diagnosis herniated disc.  Plan MRI lumbar spine.  Continue oral pain medications.

## 2011-01-09 NOTE — Progress Notes (Signed)
Chief complaint RIGHT hip and leg pain  This is a 60 year old female referred to as from the emergency room with a four-year history of pain in her RIGHT hip radiating down her RIGHT leg.  Her pain is severe.  She's had waxing and waning of symptoms otherwise for years no history of trauma.  She went to the emergency room after chart Sunday when her pain became so severe that she wanted to chop her leg off.  She denies any bowel or bladder dysfunction.  No weight loss fever or chills.  Past Medical History  Diagnosis Date  . Brain tumor (benign)   . Hypertension   . Asthma   . Chronic headaches     Past Surgical History  Procedure Date  . Cholecystectomy   . Tumor removal     History   Social History  . Marital Status: Single    Spouse Name: N/A    Number of Children: N/A  . Years of Education: 10th grade   Occupational History  . retired    Social History Main Topics  . Smoking status: Current Everyday Smoker -- 0.3 packs/day  . Smokeless tobacco: Not on file  . Alcohol Use: No  . Drug Use: Not on file  . Sexually Active: Not on file   Other Topics Concern  . Not on file   Social History Narrative  . No narrative on file    Examination her vital signs are recorded stable.  Her appearance is normal she has a small ectomorphic body habitus she is oriented x3 her mood and affect are normal she walks with a limp and with support  She's tender in the lumbar spine has a positive straight leg raise.  Lower extremities have normal range of motion, stability and strength skin is intact in both legs.  Pulses are good temperature is good in both lower extremities.  She has no lymphadenopathy in the lower extremities.  She has slight decreased sensation of the dorsum of the foot on the RIGHT normal on the LEFT.  She has no pathologic reflexes deep tendon reflexes are equal she has a positive Lasegue's sign a positive straight leg raise and a positive cross leg straight leg  raise  Balance normal  Imaging X-rays shows normal hip joint X-ray shows mild degenerative changes lumbar spine MRI ordered  Plan Anti-inflammatory, oral analgesic.  MRI spine didn't make referral as needed for epidural or surgery

## 2011-01-14 ENCOUNTER — Telehealth: Payer: Self-pay | Admitting: Orthopedic Surgery

## 2011-01-14 NOTE — Telephone Encounter (Signed)
Darel Hong from [facility] radiology said needs order faxed for patient, scheduled for MRI tomorrow 01/15/11 at 9:00am, thanks

## 2011-01-15 ENCOUNTER — Ambulatory Visit (HOSPITAL_COMMUNITY)
Admission: RE | Admit: 2011-01-15 | Discharge: 2011-01-15 | Disposition: A | Discharge status: Home / Self Care | Payer: Medicare Other | Source: Ambulatory Visit | Attending: Orthopedic Surgery | Admitting: Orthopedic Surgery

## 2011-01-15 ENCOUNTER — Telehealth: Payer: Self-pay | Admitting: Radiology

## 2011-01-15 DIAGNOSIS — M545 Low back pain, unspecified: Secondary | ICD-10-CM | POA: Insufficient documentation

## 2011-01-15 DIAGNOSIS — M25559 Pain in unspecified hip: Secondary | ICD-10-CM | POA: Insufficient documentation

## 2011-01-15 DIAGNOSIS — M5126 Other intervertebral disc displacement, lumbar region: Secondary | ICD-10-CM | POA: Insufficient documentation

## 2011-01-15 NOTE — Telephone Encounter (Signed)
Patient was precerted for MRI of L-spine. She has [employer].  Authorization #[id number] and it expires on 02-27-11.

## 2011-01-15 NOTE — Telephone Encounter (Signed)
done

## 2011-01-16 ENCOUNTER — Telehealth: Payer: Self-pay | Admitting: Orthopedic Surgery

## 2011-01-16 NOTE — Telephone Encounter (Signed)
Patient called, said had her MRI yesterday, 01/15/11, at [facility]. Has a follow up appointment scheduled 01/22/11. States still hurting, and asked about possible other or stronger medication.  Relayed that she will need to continue the current treatment until Dr. Romeo Apple reviews her results at her upcoming appointment.

## 2011-01-20 LAB — URINALYSIS, ROUTINE W REFLEX MICROSCOPIC
Leukocytes, UA: NEGATIVE
Nitrite: NEGATIVE
Specific Gravity, Urine: <1.005 — ABNORMAL LOW
Urobilinogen, UA: 0.2
pH: 6

## 2011-01-20 LAB — URINE MICROSCOPIC-ADD ON

## 2011-01-22 ENCOUNTER — Ambulatory Visit: Payer: Medicare Other | Admitting: Orthopedic Surgery

## 2011-01-23 LAB — URINALYSIS, ROUTINE W REFLEX MICROSCOPIC
Bilirubin Urine: NEGATIVE
Glucose, UA: NEGATIVE
Hgb urine dipstick: NEGATIVE
Protein, ur: NEGATIVE
Urobilinogen, UA: 0.2

## 2011-02-04 ENCOUNTER — Encounter: Payer: Self-pay | Admitting: Orthopedic Surgery

## 2011-02-04 ENCOUNTER — Telehealth: Payer: Self-pay | Admitting: Radiology

## 2011-02-04 ENCOUNTER — Ambulatory Visit (INDEPENDENT_AMBULATORY_CARE_PROVIDER_SITE_OTHER): Payer: Medicare Other | Admitting: Orthopedic Surgery

## 2011-02-04 VITALS — BP 130/90 | Ht 67.0 in | Wt 153.0 lb

## 2011-02-04 DIAGNOSIS — M5137 Other intervertebral disc degeneration, lumbosacral region: Secondary | ICD-10-CM

## 2011-02-04 DIAGNOSIS — M5136 Other intervertebral disc degeneration, lumbar region: Secondary | ICD-10-CM | POA: Insufficient documentation

## 2011-02-04 NOTE — Telephone Encounter (Signed)
I faxed a referral for this patient to Dr. Bethea for ESI injections at level L4-5. 

## 2011-02-04 NOTE — Patient Instructions (Addendum)
Medications for your back Hydrocodone Neurontin/gabapentin Ibuprofen   You will be schedule for back injections ( ESI @ L 4-5)

## 2011-02-04 NOTE — Progress Notes (Signed)
   Followup visit after MRI LS spine for continued RIGHT leg and hip pain.  Previous hip MRI results noted below.  Impression of recent MRI as noted below.  She is on ibuprofen, hydrocodone. A Neurontin, ran out, and she didn't refill it.  She comes in today on crutches still complaining of RIGHT leg and hip pain.  L3-4: Mild disc bulge without impingement. Mild left facet  arthropathy noted.  L4-5: Mild disc bulge noted without impingement. Mild facet  arthropathy noted.  L5-S1: Mild facet arthropathy noted, left greater than right. No  impingement.  IMPRESSION:  1. 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  2. Uterine leiomyomata.  1. Hip joint degenerative changes, right greater than left.  2. No MR findings for stress fracture or infection necrosis  Recommend epidural injection at L4, and 5. The MRI suggests, LEFT greater than RIGHT, disease, but her MRI of her hip does not impressive.  She is encouraged to take the Neurontin with the other medicines. Followup 2 months

## 2011-04-03 ENCOUNTER — Ambulatory Visit: Payer: Medicare Other | Admitting: Orthopedic Surgery

## 2011-05-01 ENCOUNTER — Ambulatory Visit: Payer: Medicare Other | Admitting: Orthopedic Surgery

## 2011-05-01 ENCOUNTER — Encounter: Payer: Self-pay | Admitting: Orthopedic Surgery

## 2011-06-17 ENCOUNTER — Other Ambulatory Visit (HOSPITAL_COMMUNITY): Payer: Self-pay | Admitting: Family Medicine

## 2011-06-17 DIAGNOSIS — Z139 Encounter for screening, unspecified: Secondary | ICD-10-CM

## 2011-06-27 ENCOUNTER — Ambulatory Visit (HOSPITAL_COMMUNITY): Payer: Medicare Other

## 2011-07-01 ENCOUNTER — Ambulatory Visit (HOSPITAL_COMMUNITY)
Admission: RE | Admit: 2011-07-01 | Discharge: 2011-07-01 | Disposition: A | Discharge status: Home / Self Care | Payer: Medicare Other | Source: Ambulatory Visit | Attending: Family Medicine | Admitting: Family Medicine

## 2011-07-01 DIAGNOSIS — Z139 Encounter for screening, unspecified: Secondary | ICD-10-CM

## 2011-07-01 DIAGNOSIS — Z1231 Encounter for screening mammogram for malignant neoplasm of breast: Secondary | ICD-10-CM | POA: Insufficient documentation

## 2011-12-29 ENCOUNTER — Emergency Department (HOSPITAL_COMMUNITY): Payer: Medicare Other

## 2011-12-29 ENCOUNTER — Emergency Department (HOSPITAL_COMMUNITY)
Admission: EM | Admit: 2011-12-29 | Discharge: 2011-12-29 | Disposition: A | Discharge status: Home / Self Care | Payer: Medicare Other | Attending: Emergency Medicine | Admitting: Emergency Medicine

## 2011-12-29 ENCOUNTER — Encounter (HOSPITAL_COMMUNITY): Payer: Self-pay

## 2011-12-29 DIAGNOSIS — Z825 Family history of asthma and other chronic lower respiratory diseases: Secondary | ICD-10-CM | POA: Insufficient documentation

## 2011-12-29 DIAGNOSIS — J45909 Unspecified asthma, uncomplicated: Secondary | ICD-10-CM | POA: Insufficient documentation

## 2011-12-29 DIAGNOSIS — I10 Essential (primary) hypertension: Secondary | ICD-10-CM | POA: Insufficient documentation

## 2011-12-29 DIAGNOSIS — F17200 Nicotine dependence, unspecified, uncomplicated: Secondary | ICD-10-CM | POA: Insufficient documentation

## 2011-12-29 DIAGNOSIS — G8929 Other chronic pain: Secondary | ICD-10-CM | POA: Insufficient documentation

## 2011-12-29 DIAGNOSIS — Z809 Family history of malignant neoplasm, unspecified: Secondary | ICD-10-CM | POA: Insufficient documentation

## 2011-12-29 DIAGNOSIS — R51 Headache: Secondary | ICD-10-CM | POA: Insufficient documentation

## 2011-12-29 DIAGNOSIS — K2970 Gastritis, unspecified, without bleeding: Secondary | ICD-10-CM

## 2011-12-29 DIAGNOSIS — J40 Bronchitis, not specified as acute or chronic: Secondary | ICD-10-CM

## 2011-12-29 LAB — CBC WITH DIFFERENTIAL/PLATELET
Basophils Absolute: 0 10*3/uL (ref 0.0–0.1)
Basophils Relative: 0 % (ref 0–1)
Eosinophils Absolute: 0.1 10*3/uL (ref 0.0–0.7)
Eosinophils Relative: 1 % (ref 0–5)
HCT: 40.1 % (ref 36.0–46.0)
Lymphocytes Relative: 36 % (ref 12–46)
MCH: 29.1 pg (ref 26.0–34.0)
MCHC: 34.9 g/dL (ref 30.0–36.0)
MCV: 83.4 fL (ref 78.0–100.0)
Monocytes Absolute: 0.4 10*3/uL (ref 0.1–1.0)
RDW: 14.9 % (ref 11.5–15.5)

## 2011-12-29 LAB — COMPREHENSIVE METABOLIC PANEL
AST: 16 U/L (ref 0–37)
CO2: 25 mEq/L (ref 19–32)
Calcium: 9.6 mg/dL (ref 8.4–10.5)
Creatinine, Ser: 0.7 mg/dL (ref 0.50–1.10)
GFR calc non Af Amer: >90 mL/min (ref >90)
Total Protein: 7.1 g/dL (ref 6.0–8.3)

## 2011-12-29 LAB — TROPONIN I: Troponin I: <0.3 ng/mL (ref <0.30)

## 2011-12-29 LAB — D-DIMER, QUANTITATIVE: D-Dimer, Quant: 0.32 ug/mL-FEU (ref 0.00–0.48)

## 2011-12-29 MED ORDER — RANITIDINE HCL 150 MG PO CAPS: 150.0000 mg | ORAL_CAPSULE | Freq: Two times a day (BID) | ORAL | Status: DC

## 2011-12-29 MED ORDER — AMOXICILLIN 500 MG PO CAPS: 500.0000 mg | ORAL_CAPSULE | Freq: Three times a day (TID) | ORAL | Status: DC

## 2011-12-29 MED ORDER — HYDROCODONE-ACETAMINOPHEN 5-325 MG PO TABS: 1.0000 | ORAL_TABLET | Freq: Four times a day (QID) | ORAL | Status: AC | PRN

## 2011-12-29 MED ORDER — PANTOPRAZOLE SODIUM 40 MG IV SOLR
40.0000 mg | Freq: Once | INTRAVENOUS | Status: AC
  Administered 2011-12-29: 40 mg via INTRAVENOUS
  Filled 2011-12-29: qty 40

## 2011-12-29 MED ORDER — SODIUM CHLORIDE 0.9 % IV SOLN
Freq: Once | INTRAVENOUS | Status: AC
  Administered 2011-12-29: 15:00:00 via INTRAVENOUS

## 2011-12-29 NOTE — ED Notes (Signed)
Pt wants something to drink, I informed pt to wait until she is seen by EDP.

## 2011-12-29 NOTE — ED Notes (Signed)
Pt c./o chest pain, indigestion since Friday, Dr. Estell Harpin in room prior to RN, see EDP assessment for further.

## 2011-12-29 NOTE — ED Notes (Signed)
Pt upset over delay, states that she is ready to go home, Dr. Estell Harpin advised,

## 2011-12-29 NOTE — ED Provider Notes (Cosign Needed)
History   This chart was scribed for Courtney Lennert, MD by Gerlean Ren. This patient was seen in room APA06/APA06 and the patient's care was started at 2:46PM.   CSN: [id number]  Arrival date & time 12/29/11  1253   First MD Initiated Contact with Patient 12/29/11 1437      Chief Complaint  Patient presents with  . Chest Pain    (Consider location/radiation/quality/duration/timing/severity/associated sxs/prior treatment) Patient is a 61 y.o. female presenting with chest pain. The history is provided by the patient (pt complains of cough). No language interpreter was used.  Chest Pain The chest pain began 1 - 2 weeks ago. Chest pain occurs constantly. The chest pain is unchanged. The pain is associated with breathing and coughing. The severity of the pain is moderate. The quality of the pain is described as squeezing. The pain radiates to the left shoulder. Chest pain is worsened by deep breathing. Primary symptoms include nausea. Pertinent negatives for primary symptoms include no fever, no fatigue, no cough, no abdominal pain and no vomiting.  Pertinent negatives for past medical history include no seizures.    Courtney Zuniga is a 61 y.o. female who presents to the Emergency Department complaining of one week of a squeezing, constant chest pain radiating to the left shoulder that worsens when breathing and ambulating.  Pt denies fever, chills, and emesis as associated symptoms. Pt reports associated nausea and upper abdominal pain.  Pt has h/o HTN.  Pt has no known allergies.  Pt is a current everyday smoker (0.3pack/day) but denies alcohol use.    Past Medical History  Diagnosis Date  . Brain tumor (benign)   . Hypertension   . Asthma   . Chronic headaches     Past Surgical History  Procedure Date  . Cholecystectomy   . Tumor removal     Family History  Problem Relation Age of Onset  . Cancer    . Asthma      History  Substance Use Topics  . Smoking status: Current  Every Day Smoker -- 0.3 packs/day  . Smokeless tobacco: Not on file  . Alcohol Use: No    No OB history provided.  Review of Systems  Constitutional: Negative for fever and fatigue.  HENT: Negative for congestion, sinus pressure and ear discharge.   Eyes: Negative for discharge.  Respiratory: Negative for cough.   Cardiovascular: Positive for chest pain.  Gastrointestinal: Positive for nausea. Negative for vomiting, abdominal pain and diarrhea.  Genitourinary: Negative for frequency and hematuria.  Musculoskeletal: Negative for back pain.  Skin: Negative for rash.  Neurological: Negative for seizures and headaches.  Hematological: Negative.   Psychiatric/Behavioral: Negative for hallucinations.    Allergies  Review of patient's allergies indicates no known allergies.  Home Medications   Current Outpatient Rx  Name Route Sig Dispense Refill  . ALBUTEROL SULFATE HFA 108 (90 BASE) MCG/ACT IN AERS Inhalation Inhale 2 puffs into the lungs every 6 (six) hours as needed. Shortness of Breath    . ALENDRONATE SODIUM 70 MG PO TABS Oral Take 70 mg by mouth every 7 (seven) days. Take with a full glass of water on an empty stomach.    . AMITRIPTYLINE HCL 75 MG PO TABS Oral Take 150 mg by mouth at bedtime.    . ASPIRIN-CAFFEINE 845-65 MG PO PACK Oral Take 1 packet by mouth every 4 (four) hours as needed. Pain    . FLUTICASONE-SALMETEROL 500-50 MCG/DOSE IN AEPB Inhalation Inhale 1 puff into the  lungs every 12 (twelve) hours.    Marland Kitchen GABAPENTIN 300 MG PO CAPS Oral Take 300 mg by mouth at bedtime.    Marland Kitchen HYDROCHLOROTHIAZIDE 25 MG PO TABS Oral Take 12.5 mg by mouth daily.    . IBUPROFEN 800 MG PO TABS Oral Take 800 mg by mouth every 8 (eight) hours as needed. Pain    . LORATADINE 10 MG PO TABS Oral Take 10 mg by mouth daily.    . TRAMADOL HCL 50 MG PO TABS Oral Take 50 mg by mouth every 6 (six) hours as needed. Pain      BP 140/80  Pulse 71  Temp 98.8 F (37.1 C) (Oral)  Resp 18  Ht 5\' 6"   (1.676 m)  Wt 149 lb (67.586 kg)  BMI 24.05 kg/m2  SpO2 96%  Physical Exam  Nursing note and vitals reviewed. Constitutional: She is oriented to person, place, and time. She appears well-developed.  HENT:  Head: Normocephalic and atraumatic.  Eyes: Conjunctivae normal and EOM are normal. No scleral icterus.  Neck: Neck supple. No thyromegaly present.  Cardiovascular: Normal rate and regular rhythm.  Exam reveals no gallop and no friction rub.   No murmur heard. Pulmonary/Chest: Effort normal. No stridor. She has no wheezes. She has no rales. She exhibits no tenderness.  Abdominal: Soft. She exhibits no distension. There is no tenderness. There is no rebound.       Moderate epigastric and RUQ tenderness to palpation.  Musculoskeletal: Normal range of motion. She exhibits no edema.  Lymphadenopathy:    She has no cervical adenopathy.  Neurological: She is alert and oriented to person, place, and time. Coordination normal.  Skin: No rash noted. No erythema.  Psychiatric: She has a normal mood and affect. Her behavior is normal.    ED Course  Procedures (including critical care time) DIAGNOSTIC STUDIES: Oxygen Saturation is 96% on room air, adequate by my interpretation.    COORDINATION OF CARE: 2:49PM- Discussed treatment plan with pt at bedside and pt agreed to plan.    Labs Reviewed - No data to display Dg Chest 2 View  12/29/2011  *RADIOLOGY REPORT*  Clinical Data: Chest pain  CHEST - 2 VIEW  Comparison: 09/22/2004  Findings: Hyperinflation with coarse interstitial markings.  Apical opacity/emphysematous changes, right greater than left, mild interval progression.  Bibasilar linear scarring.  Aortic arch atherosclerosis.  Mediastinal contours otherwise within normal range.  No pleural effusion or pneumothorax.  No acute osseous finding.  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  Right apical process is favored to reflect  progression of emphysema/scarring. Recommend CT follow-up to exclude underlying nodule.   Original Report Authenticated By: Waneta Martins, M.D.    Results for orders placed during the hospital encounter of 12/29/11  CBC WITH DIFFERENTIAL      Component Value Range   WBC 8.2  4.0 - 10.5 K/uL   RBC 4.81  3.87 - 5.11 MIL/uL   Hemoglobin 14.0  12.0 - 15.0 g/dL   HCT 16.1  09.6 - 04.5 %   MCV 83.4  78.0 - 100.0 fL   MCH 29.1  26.0 - 34.0 pg   MCHC 34.9  30.0 - 36.0 g/dL   RDW 40.9  81.1 - 91.4 %   Platelets 224  150 - 400 K/uL   Neutrophils Relative 58  43 - 77 %   Neutro Abs 4.7  1.7 - 7.7 K/uL   Lymphocytes Relative 36  12 - 46 %  Lymphs Abs 2.9  0.7 - 4.0 K/uL   Monocytes Relative 5  3 - 12 %   Monocytes Absolute 0.4  0.1 - 1.0 K/uL   Eosinophils Relative 1  0 - 5 %   Eosinophils Absolute 0.1  0.0 - 0.7 K/uL   Basophils Relative 0  0 - 1 %   Basophils Absolute 0.0  0.0 - 0.1 K/uL  COMPREHENSIVE METABOLIC PANEL      Component Value Range   Sodium 141  135 - 145 mEq/L   Potassium 4.2  3.5 - 5.1 mEq/L   Chloride 106  96 - 112 mEq/L   CO2 25  19 - 32 mEq/L   Glucose, Bld 91  70 - 99 mg/dL   BUN 8  6 - 23 mg/dL   Creatinine, Ser 1.61  0.50 - 1.10 mg/dL   Calcium 9.6  8.4 - 09.6 mg/dL   Total Protein 7.1  6.0 - 8.3 g/dL   Albumin 3.9  3.5 - 5.2 g/dL   AST 16  0 - 37 U/L   ALT 8  0 - 35 U/L   Alkaline Phosphatase 174 (*) 39 - 117 U/L   Total Bilirubin 0.4  0.3 - 1.2 mg/dL   GFR calc non Af Amer >90  >90 mL/min   GFR calc Af Amer >90  >90 mL/min  LIPASE, BLOOD      Component Value Range   Lipase 19  11 - 59 U/L  D-DIMER, QUANTITATIVE      Component Value Range   D-Dimer, Quant 0.32  0.00 - 0.48 ug/mL-FEU  TROPONIN I      Component Value Range   Troponin I <0.30  <0.30 ng/mL     No diagnosis found.  Date: 12/29/2011  Rate90  Rhythm: normal sinus rhythm  QRS Axis: normal  Intervals: normal  ST/T Wave abnormalities: normal  Conduction Disutrbances:none   Narrative Interpretation:   Old EKG Reviewed: none available    Pt improved with protonix MDM   The chart was scribed for me under my direct supervision.  I personally performed the history, physical, and medical decision making and all procedures in the evaluation of this patient.Courtney Lennert, MD 12/29/11 1719  Courtney Lennert, MD 12/29/11 [phone]

## 2011-12-29 NOTE — ED Notes (Signed)
Pt reports has had nonproductive cough for past few days.  Reports started having chest pain and sob Friday.  Pt says chest pain hurts worse with coughing.   Denies fever.

## 2011-12-29 NOTE — ED Notes (Signed)
Pt states that she feels better, ready to go home,

## 2012-01-17 ENCOUNTER — Encounter (HOSPITAL_COMMUNITY): Payer: Self-pay

## 2012-01-17 ENCOUNTER — Emergency Department (HOSPITAL_COMMUNITY)
Admission: EM | Admit: 2012-01-17 | Discharge: 2012-01-17 | Disposition: A | Discharge status: Home / Self Care | Payer: Medicare Other | Attending: Emergency Medicine | Admitting: Emergency Medicine

## 2012-01-17 DIAGNOSIS — Z809 Family history of malignant neoplasm, unspecified: Secondary | ICD-10-CM | POA: Insufficient documentation

## 2012-01-17 DIAGNOSIS — M25551 Pain in right hip: Secondary | ICD-10-CM

## 2012-01-17 DIAGNOSIS — G8929 Other chronic pain: Secondary | ICD-10-CM | POA: Insufficient documentation

## 2012-01-17 DIAGNOSIS — Z825 Family history of asthma and other chronic lower respiratory diseases: Secondary | ICD-10-CM | POA: Insufficient documentation

## 2012-01-17 DIAGNOSIS — I10 Essential (primary) hypertension: Secondary | ICD-10-CM | POA: Insufficient documentation

## 2012-01-17 DIAGNOSIS — J45909 Unspecified asthma, uncomplicated: Secondary | ICD-10-CM | POA: Insufficient documentation

## 2012-01-17 DIAGNOSIS — M25559 Pain in unspecified hip: Secondary | ICD-10-CM | POA: Insufficient documentation

## 2012-01-17 DIAGNOSIS — F17200 Nicotine dependence, unspecified, uncomplicated: Secondary | ICD-10-CM | POA: Insufficient documentation

## 2012-01-17 HISTORY — DX: Pain in unspecified hip: M25.559

## 2012-01-17 HISTORY — DX: Other chronic pain: G89.29

## 2012-01-17 MED ORDER — HYDROCODONE-ACETAMINOPHEN 5-325 MG PO TABS
1.0000 | ORAL_TABLET | Freq: Once | ORAL | Status: AC
  Administered 2012-01-17: 1 via ORAL
  Filled 2012-01-17: qty 1

## 2012-01-17 MED ORDER — HYDROCODONE-ACETAMINOPHEN 5-325 MG PO TABS: 1.0000 | ORAL_TABLET | Freq: Four times a day (QID) | ORAL | Status: AC | PRN

## 2012-01-17 NOTE — ED Notes (Signed)
Pt has chronic hip/back pain, waiting to see a specialist. Denies any known recent injury.

## 2012-01-17 NOTE — ED Notes (Signed)
Pt presents with chronic rt hip pain. Pt states pain increases with ambulation. Pulses equal bilateral. Pt denies injury. Pt is to see an orthopedic dr soon, unsure of when.

## 2012-01-17 NOTE — ED Provider Notes (Signed)
History     CSN: [id number]  Arrival date & time 01/17/12  1421   First MD Initiated Contact with Patient 01/17/12 1609      Chief Complaint  Patient presents with  . Hip Pain    (Consider location/radiation/quality/duration/timing/severity/associated sxs/prior treatment) HPI Comments: states she has a PCP in [place].  He writes rx's for pain medicine but he was not in the office yest.  He is reportedly helping her get in to see an orthopedist but no appt is presently set.    Patient is a 61 y.o. female presenting with hip pain. The history is provided by the patient. No language interpreter was used.  Hip Pain This is a chronic problem. Episode onset: hurting "for about 6 years".  no known injury. The problem occurs constantly. The problem has been unchanged. Pertinent negatives include no chills or fever. The symptoms are aggravated by walking and standing. She has tried nothing for the symptoms. The treatment provided no relief.    Past Medical History  Diagnosis Date  . Brain tumor (benign)   . Hypertension   . Asthma   . Chronic headaches   . Chronic hip pain   . Chronic pain     Past Surgical History  Procedure Date  . Cholecystectomy   . Tumor removal     Family History  Problem Relation Age of Onset  . Cancer    . Asthma      History  Substance Use Topics  . Smoking status: Current Every Day Smoker -- 0.3 packs/day    Types: Cigarettes  . Smokeless tobacco: Not on file  . Alcohol Use: No    OB History    Grav Para Term Preterm Abortions TAB SAB Ect Mult Living                  Review of Systems  Constitutional: Negative for fever and chills.  Musculoskeletal:       Hip pain   All other systems reviewed and are negative.    Allergies  Review of patient's allergies indicates no known allergies.  Home Medications   Current Outpatient Rx  Name Route Sig Dispense Refill  . ALBUTEROL SULFATE HFA 108 (90 BASE) MCG/ACT IN AERS Inhalation  Inhale 2 puffs into the lungs every 6 (six) hours as needed. Shortness of Breath    . ALENDRONATE SODIUM 70 MG PO TABS Oral Take 70 mg by mouth every 7 (seven) days. Take with a full glass of water on an empty stomach.    . AMITRIPTYLINE HCL 75 MG PO TABS Oral Take 150 mg by mouth at bedtime.    . AMOXICILLIN 500 MG PO CAPS Oral Take 1 capsule (500 mg total) by mouth 3 (three) times daily. 21 capsule 0  . ASPIRIN-CAFFEINE 845-65 MG PO PACK Oral Take 1 packet by mouth every 4 (four) hours as needed. Pain    . FLUTICASONE-SALMETEROL 500-50 MCG/DOSE IN AEPB Inhalation Inhale 1 puff into the lungs every 12 (twelve) hours.    Marland Kitchen GABAPENTIN 300 MG PO CAPS Oral Take 300 mg by mouth at bedtime.    Marland Kitchen HYDROCHLOROTHIAZIDE 25 MG PO TABS Oral Take 12.5 mg by mouth daily.    . IBUPROFEN 800 MG PO TABS Oral Take 800 mg by mouth every 8 (eight) hours as needed. Pain    . LORATADINE 10 MG PO TABS Oral Take 10 mg by mouth daily.    Marland Kitchen RANITIDINE HCL 150 MG PO CAPS Oral Take  1 capsule (150 mg total) by mouth 2 (two) times daily. 60 capsule 0  . TRAMADOL HCL 50 MG PO TABS Oral Take 50 mg by mouth every 6 (six) hours as needed. Pain      BP 136/94  Pulse 108  Temp 99.2 F (37.3 C) (Oral)  Resp 20  Ht 5\' 7"  (1.702 m)  Wt 156 lb (70.761 kg)  BMI 24.43 kg/m2  SpO2 99%  Physical Exam  Nursing note and vitals reviewed. Constitutional: She is oriented to person, place, and time. She appears well-developed and well-nourished. No distress.  HENT:  Head: Normocephalic and atraumatic.  Eyes: EOM are normal.  Neck: Normal range of motion.  Cardiovascular: Normal rate, regular rhythm and normal heart sounds.   Pulmonary/Chest: Effort normal and breath sounds normal.  Abdominal: Soft. She exhibits no distension. There is no tenderness.  Musculoskeletal: She exhibits no tenderness.       Right hip: She exhibits decreased range of motion. She exhibits normal strength, no tenderness, no bony tenderness, no swelling, no  crepitus, no deformity and no laceration.       Legs:      Pain with weight bearing.  Pain with passive internal and external rotation.  Neurological: She is alert and oriented to person, place, and time.  Skin: Skin is warm and dry.  Psychiatric: She has a normal mood and affect. Judgment normal.    ED Course  Procedures (including critical care time)  Labs Reviewed - No data to display No results found.   1. Right hip pain       MDM  Reviewed R hip x-rays from 2012: DJD  rx-hydrocodone, 12 F/u with PCP prn       Evalina Field, PA 01/17/12 1657

## 2012-01-18 NOTE — ED Provider Notes (Signed)
Medical screening examination/treatment/procedure(s) were performed by non-physician practitioner and as supervising physician I was immediately available for consultation/collaboration.   Kazi Reppond L Dorice Stiggers, MD 01/18/12 0747 

## 2012-10-13 ENCOUNTER — Emergency Department (HOSPITAL_COMMUNITY): Payer: Medicare Other

## 2012-10-13 ENCOUNTER — Encounter (HOSPITAL_COMMUNITY): Payer: Self-pay | Admitting: *Deleted

## 2012-10-13 ENCOUNTER — Emergency Department (HOSPITAL_COMMUNITY)
Admission: EM | Admit: 2012-10-13 | Discharge: 2012-10-13 | Disposition: A | Discharge status: Home / Self Care | Payer: Medicare Other | Attending: Emergency Medicine | Admitting: Emergency Medicine

## 2012-10-13 DIAGNOSIS — F17200 Nicotine dependence, unspecified, uncomplicated: Secondary | ICD-10-CM | POA: Insufficient documentation

## 2012-10-13 DIAGNOSIS — Z8679 Personal history of other diseases of the circulatory system: Secondary | ICD-10-CM | POA: Insufficient documentation

## 2012-10-13 DIAGNOSIS — J45909 Unspecified asthma, uncomplicated: Secondary | ICD-10-CM | POA: Insufficient documentation

## 2012-10-13 DIAGNOSIS — G8929 Other chronic pain: Secondary | ICD-10-CM

## 2012-10-13 DIAGNOSIS — Z8669 Personal history of other diseases of the nervous system and sense organs: Secondary | ICD-10-CM | POA: Insufficient documentation

## 2012-10-13 DIAGNOSIS — I10 Essential (primary) hypertension: Secondary | ICD-10-CM | POA: Insufficient documentation

## 2012-10-13 DIAGNOSIS — M25551 Pain in right hip: Secondary | ICD-10-CM

## 2012-10-13 DIAGNOSIS — Z79899 Other long term (current) drug therapy: Secondary | ICD-10-CM | POA: Insufficient documentation

## 2012-10-13 MED ORDER — OXYCODONE-ACETAMINOPHEN 5-325 MG PO TABS: 1.0000 | ORAL_TABLET | ORAL | Status: DC | PRN

## 2012-10-13 MED ORDER — OXYCODONE-ACETAMINOPHEN 5-325 MG PO TABS
2.0000 | ORAL_TABLET | Freq: Once | ORAL | Status: AC
  Administered 2012-10-13: 2 via ORAL
  Filled 2012-10-13: qty 2

## 2012-10-13 NOTE — ED Notes (Signed)
Pain rt hip for 11years, worse last night.No  injury

## 2012-10-13 NOTE — ED Notes (Signed)
Pt states intermittent hip pain x 10 years. Pain to right hip began again Sunday and became much worse yesterday. NAD

## 2012-10-13 NOTE — ED Provider Notes (Signed)
History    CSN: [id number] Arrival date & time 10/13/12  1356  First MD Initiated Contact with Patient 10/13/12 1536     Chief Complaint  Patient presents with  . Hip Pain   (Consider location/radiation/quality/duration/timing/severity/associated sxs/prior Treatment) HPI  62 year old female who has a history of chronic right hip pain. She comes in today stating that the pain began to worsen on Sunday. He states it began after church. She has not had any overuse injuries or falls. The pain is in the front of the right hip. It is tender to palpation and weightbearing. She is able to ambulate but it hurts more to put weight on it. She has taken ibuprofen without relief. It is nonradiating in nature. It is sharp. It is like previous pain in this hip. Past Medical History  Diagnosis Date  . Brain tumor (benign)   . Hypertension   . Asthma   . Chronic headaches   . Chronic hip pain   . Chronic pain    Past Surgical History  Procedure Laterality Date  . Cholecystectomy    . Tumor removal     Family History  Problem Relation Age of Onset  . Cancer    . Asthma     History  Substance Use Topics  . Smoking status: Current Every Day Smoker -- 0.33 packs/day    Types: Cigarettes  . Smokeless tobacco: Not on file  . Alcohol Use: No   OB History   Grav Para Term Preterm Abortions TAB SAB Ect Mult Living                 Review of Systems  All other systems reviewed and are negative.    Allergies  Review of patient's allergies indicates no known allergies.  Home Medications   Current Outpatient Rx  Name  Route  Sig  Dispense  Refill  . albuterol (PROVENTIL HFA;VENTOLIN HFA) 108 (90 BASE) MCG/ACT inhaler   Inhalation   Inhale 2 puffs into the lungs every 6 (six) hours as needed. Shortness of Breath         . alendronate (FOSAMAX) 70 MG tablet   Oral   Take 70 mg by mouth every 7 (seven) days. Take with a full glass of water on an empty stomach.         Marland Kitchen  amitriptyline (ELAVIL) 75 MG tablet   Oral   Take 150 mg by mouth at bedtime.         Marland Kitchen amoxicillin (AMOXIL) 500 MG capsule   Oral   Take 1 capsule (500 mg total) by mouth 3 (three) times daily.   21 capsule   0   . Aspirin-Caffeine (BC FAST PAIN RELIEF) 845-65 MG PACK   Oral   Take 1 packet by mouth every 4 (four) hours as needed. Pain         . Fluticasone-Salmeterol (ADVAIR) 500-50 MCG/DOSE AEPB   Inhalation   Inhale 1 puff into the lungs every 12 (twelve) hours.         . gabapentin (NEURONTIN) 300 MG capsule   Oral   Take 300 mg by mouth at bedtime.         . hydrochlorothiazide (HYDRODIURIL) 25 MG tablet   Oral   Take 12.5 mg by mouth daily.         Marland Kitchen ibuprofen (ADVIL,MOTRIN) 800 MG tablet   Oral   Take 800 mg by mouth every 8 (eight) hours as needed. Pain         .  loratadine (CLARITIN) 10 MG tablet   Oral   Take 10 mg by mouth daily.         Marland Kitchen oxyCODONE-acetaminophen (PERCOCET/ROXICET) 5-325 MG per tablet   Oral   Take 1 tablet by mouth every 4 (four) hours as needed for pain.   6 tablet   0   . ranitidine (ZANTAC) 150 MG capsule   Oral   Take 1 capsule (150 mg total) by mouth 2 (two) times daily.   60 capsule   0   . traMADol (ULTRAM) 50 MG tablet   Oral   Take 50 mg by mouth every 6 (six) hours as needed. Pain          BP 101/63  Pulse 83  Temp(Src) 99 F (37.2 C) (Oral)  Resp 16  Ht 5' 6.5" (1.689 m)  Wt 156 lb (70.761 kg)  BMI 24.8 kg/m2  SpO2 99% Physical Exam  Nursing note and vitals reviewed. Constitutional: She is oriented to person, place, and time. She appears well-developed and well-nourished.  HENT:  Head: Normocephalic and atraumatic.  Right Ear: External ear normal.  Left Ear: External ear normal.  Nose: Nose normal.  Mouth/Throat: Oropharynx is clear and moist.  Eyes: Conjunctivae and EOM are normal. Pupils are equal, round, and reactive to light.  Neck: Normal range of motion. Neck supple.  Cardiovascular:  Normal rate, regular rhythm, normal heart sounds and intact distal pulses.   Pulmonary/Chest: Effort normal and breath sounds normal.  Abdominal: Soft. Bowel sounds are normal.  Musculoskeletal: Normal range of motion.  Right hip with some anterior tenderness but no lateral tenderness. There is no warmth or swelling noted. She has full active range of motion. Dorsal pedalis pulses are intact bilaterally. Sensation is intact  Neurological: She is alert and oriented to person, place, and time. She has normal reflexes.  Skin: Skin is warm and dry.  Psychiatric: She has a normal mood and affect. Her behavior is normal. Thought content normal.    ED Course  Procedures (including critical care time) Labs Reviewed - No data to display Dg Pelvis 1-2 Views  10/13/2012   *RADIOLOGY REPORT*  Clinical Data: Right pelvic pain.  PELVIS - 1-2 VIEW  Comparison: Pelvic CT dated 06/13/2010.  Findings: Stable mild superior subluxation of the left pubic body relative to the right pubic body.  Mild superior spur formation on both sides of the symphysis pubis.  The remainder of the pelvic bones have normal appearances.  Calcified uterine fibroids.  IMPRESSION: No acute abnormality.   Original Report Authenticated By: Beckie Salts, M.D.   1. Hip pain, chronic, right     MDM    Hilario Quarry, MD 10/13/12 2122

## 2013-04-14 HISTORY — PX: COLONOSCOPY: SHX174

## 2013-06-15 ENCOUNTER — Encounter (HOSPITAL_COMMUNITY): Payer: Self-pay | Admitting: Emergency Medicine

## 2013-06-15 ENCOUNTER — Emergency Department (HOSPITAL_COMMUNITY)
Admission: EM | Admit: 2013-06-15 | Discharge: 2013-06-15 | Disposition: A | Discharge status: Home / Self Care | Payer: Medicare Other | Attending: Emergency Medicine | Admitting: Emergency Medicine

## 2013-06-15 ENCOUNTER — Emergency Department (HOSPITAL_COMMUNITY): Payer: Medicare Other

## 2013-06-15 DIAGNOSIS — G8929 Other chronic pain: Secondary | ICD-10-CM | POA: Insufficient documentation

## 2013-06-15 DIAGNOSIS — Z7982 Long term (current) use of aspirin: Secondary | ICD-10-CM | POA: Insufficient documentation

## 2013-06-15 DIAGNOSIS — Z86011 Personal history of benign neoplasm of the brain: Secondary | ICD-10-CM | POA: Insufficient documentation

## 2013-06-15 DIAGNOSIS — J189 Pneumonia, unspecified organism: Secondary | ICD-10-CM

## 2013-06-15 DIAGNOSIS — Z79899 Other long term (current) drug therapy: Secondary | ICD-10-CM | POA: Insufficient documentation

## 2013-06-15 DIAGNOSIS — IMO0002 Reserved for concepts with insufficient information to code with codable children: Secondary | ICD-10-CM | POA: Insufficient documentation

## 2013-06-15 DIAGNOSIS — J45901 Unspecified asthma with (acute) exacerbation: Secondary | ICD-10-CM | POA: Insufficient documentation

## 2013-06-15 DIAGNOSIS — F17200 Nicotine dependence, unspecified, uncomplicated: Secondary | ICD-10-CM | POA: Insufficient documentation

## 2013-06-15 DIAGNOSIS — I10 Essential (primary) hypertension: Secondary | ICD-10-CM | POA: Insufficient documentation

## 2013-06-15 DIAGNOSIS — J159 Unspecified bacterial pneumonia: Secondary | ICD-10-CM | POA: Insufficient documentation

## 2013-06-15 MED ORDER — AZITHROMYCIN 250 MG PO TABS: 250.0000 mg | ORAL_TABLET | Freq: Every day | ORAL | Status: DC

## 2013-06-15 MED ORDER — PREDNISONE 20 MG PO TABS: 60.0000 mg | ORAL_TABLET | Freq: Every day | ORAL | Status: DC

## 2013-06-15 NOTE — Discharge Instructions (Signed)

## 2013-06-15 NOTE — ED Notes (Signed)
Cough with shortness of breath for over a month, taking otc without relief

## 2013-06-15 NOTE — ED Provider Notes (Signed)
CSN: [id number]     Arrival date & time 06/15/13  1247 History  This chart was scribed for [employer]. Alvino Chapel, MD by Ludger Nutting, ED Scribe. This patient was seen in room APA05/APA05 and the patient's care was started 1:28 PM.    Chief Complaint  Patient presents with  . Cough      The history is provided by the patient. No language interpreter was used.    HPI Comments: Courtney Zuniga is a 63 y.o. female who presents to the Emergency Department complaining of 6 weeks of constant, unchanged cough with associated SOB. Pt states she received the flu vaccine but got the flu several weeks ago. She has tried multiple OTC medications without any relief. She reports history of asthma and allergies and states the cough has been causing it to flare up. She denies recent antibiotic use, recent hospitalizations, or recent long distance travel. She denies chest pain, chest tightness.    Past Medical History  Diagnosis Date  . Brain tumor (benign)   . Hypertension   . Asthma   . Chronic headaches   . Chronic hip pain   . Chronic pain    Past Surgical History  Procedure Laterality Date  . Cholecystectomy    . Tumor removal     Family History  Problem Relation Age of Onset  . Cancer    . Asthma     History  Substance Use Topics  . Smoking status: Current Every Day Smoker -- 0.33 packs/day    Types: Cigarettes  . Smokeless tobacco: Not on file  . Alcohol Use: No   OB History   Grav Para Term Preterm Abortions TAB SAB Ect Mult Living                 Review of Systems  A complete 10 system review of systems was obtained and all systems are negative except as noted in the HPI and PMH.    Allergies  Review of patient's allergies indicates no known allergies.  Home Medications   Current Outpatient Rx  Name  Route  Sig  Dispense  Refill  . albuterol (PROVENTIL HFA;VENTOLIN HFA) 108 (90 BASE) MCG/ACT inhaler   Inhalation   Inhale 2 puffs into the lungs every 6 (six) hours as  needed. Shortness of Breath         . alendronate (FOSAMAX) 70 MG tablet   Oral   Take 70 mg by mouth every 7 (seven) days. Take with a full glass of water on an empty stomach.         Marland Kitchen amitriptyline (ELAVIL) 75 MG tablet   Oral   Take 150 mg by mouth at bedtime.         Marland Kitchen aspirin EC 81 MG tablet   Oral   Take 81 mg by mouth daily.         . Aspirin-Caffeine (BC FAST PAIN RELIEF) 845-65 MG PACK   Oral   Take 1 packet by mouth every 4 (four) hours as needed. Pain         . Fluticasone-Salmeterol (ADVAIR) 500-50 MCG/DOSE AEPB   Inhalation   Inhale 1 puff into the lungs every 12 (twelve) hours.         . gabapentin (NEURONTIN) 300 MG capsule   Oral   Take 300 mg by mouth at bedtime.         . traZODone (DESYREL) 100 MG tablet   Oral   Take 100 mg by mouth at  bedtime.         Marland Kitchen azithromycin (ZITHROMAX) 250 MG tablet   Oral   Take 1 tablet (250 mg total) by mouth daily. Take first 2 tablets together, then 1 every day until finished.   6 tablet   0   . predniSONE (DELTASONE) 20 MG tablet   Oral   Take 3 tablets (60 mg total) by mouth daily.   9 tablet   0    BP 142/84  Pulse 114  Temp(Src) 98.8 F (37.1 C) (Oral)  Resp 20  Ht 5\' 7"  (1.702 m)  Wt 156 lb (70.761 kg)  BMI 24.43 kg/m2  SpO2 97% Physical Exam  Nursing note and vitals reviewed. Constitutional: She is oriented to person, place, and time. She appears well-developed and well-nourished.  HENT:  Head: Normocephalic and atraumatic.  Mildly harsh voice. Mild nasal congestion.   Eyes: Conjunctivae and EOM are normal. Pupils are equal, round, and reactive to light.  Neck: Normal range of motion. Neck supple.  Cardiovascular: Normal rate, regular rhythm and normal heart sounds.   Pulmonary/Chest: Effort normal. She has wheezes (Diffuse wheezing and prolonged expiratory phase with upper lung field harsh sounds. ).  Abdominal: Soft. Bowel sounds are normal.  Musculoskeletal: Normal range of  motion.  Neurological: She is alert and oriented to person, place, and time.  Skin: Skin is warm and dry.  Psychiatric: She has a normal mood and affect. Her behavior is normal.    ED Course  Procedures (including critical care time)  DIAGNOSTIC STUDIES: Oxygen Saturation is 97% on RA, adequate by my interpretation.    COORDINATION OF CARE: 1:28 PM Discussed treatment plan with pt at bedside and pt agreed to plan.   Labs Review Labs Reviewed - No data to display Imaging Review Dg Chest 2 View  06/15/2013   CLINICAL DATA:  Cough and wheezing  EXAM: CHEST  2 VIEW  COMPARISON:  December 29, 2011  FINDINGS: There is underlying emphysematous change. There is consolidation with volume loss in the right middle lobe. There is mild scarring in the bases bilaterally.  Heart size is within normal limits. Pulmonary vascularity reflects underlying emphysema. No adenopathy. No bone lesions.  IMPRESSION: Right middle lobe consolidation with volume loss. Mild scarring in the bases. Underlying emphysematous change.   Electronically Signed   By: Lowella Grip M.D.   On: 06/15/2013 14:37     EKG Interpretation None      MDM   Final diagnoses:  CAP (community acquired pneumonia)    Patient with cough over the last 6 weeks. She's had sputum production on-and-off. She states that the cough is not bothering her. No relief with her medication home. Chest x-ray done and showed possible pneumonia. We'll treat with antibiotics. She also has wheezing and history of asthma, and we'll give a short dose of steroids  I personally performed the services described in this documentation, which was scribed in my presence. The recorded information has been reviewed and is accurate.    [name]. Alvino Chapel, MD 06/15/13 1513

## 2013-12-01 ENCOUNTER — Ambulatory Visit (INDEPENDENT_AMBULATORY_CARE_PROVIDER_SITE_OTHER): Payer: Medicare Other

## 2013-12-01 ENCOUNTER — Ambulatory Visit (INDEPENDENT_AMBULATORY_CARE_PROVIDER_SITE_OTHER): Payer: Medicare Other | Admitting: Orthopedic Surgery

## 2013-12-01 VITALS — BP 147/92 | Ht 67.0 in | Wt 146.0 lb

## 2013-12-01 DIAGNOSIS — M51369 Other intervertebral disc degeneration, lumbar region without mention of lumbar back pain or lower extremity pain: Secondary | ICD-10-CM

## 2013-12-01 DIAGNOSIS — M51379 Other intervertebral disc degeneration, lumbosacral region without mention of lumbar back pain or lower extremity pain: Secondary | ICD-10-CM

## 2013-12-01 DIAGNOSIS — M48061 Spinal stenosis, lumbar region without neurogenic claudication: Secondary | ICD-10-CM

## 2013-12-01 DIAGNOSIS — M5430 Sciatica, unspecified side: Secondary | ICD-10-CM

## 2013-12-01 DIAGNOSIS — M5442 Lumbago with sciatica, left side: Secondary | ICD-10-CM

## 2013-12-01 DIAGNOSIS — M5136 Other intervertebral disc degeneration, lumbar region: Secondary | ICD-10-CM

## 2013-12-01 DIAGNOSIS — M5137 Other intervertebral disc degeneration, lumbosacral region: Secondary | ICD-10-CM

## 2013-12-01 MED ORDER — ACETAMINOPHEN-CODEINE #3 300-30 MG PO TABS: 1.0000 | ORAL_TABLET | ORAL | Status: DC | PRN

## 2013-12-01 MED ORDER — PREDNISONE (PAK) 10 MG PO TABS: ORAL_TABLET | Freq: Every day | ORAL | Status: DC

## 2013-12-01 NOTE — Patient Instructions (Signed)
WE WILL REFER YOU FOR EPIDURAL STEROID INJECTIONS START PREDNISONE STOP ALEVE  FOLLOW UP AFTER YOUR LAST INJECTION

## 2013-12-03 NOTE — Progress Notes (Signed)
Subjective:     Patient ID: Courtney Zuniga, female   DOB: 11/13/1950, 63 y.o.   MRN: [id number]  HPI Chief Complaint  Patient presents with  . Back Pain    Back pain. She has been treated by several different doctors.   Seen by me several years ago is advised to have epidural steroids for degenerative disc disease and spinal stenosis. For some reason she did not get this done she presents back with severe back and leg pain radiating down her left leg with difficulty ambulating giving way weakness of the left lower extremity and numbness and tingling  Past Medical History  Diagnosis Date  . Brain tumor (benign)   . Hypertension   . Asthma   . Chronic headaches   . Chronic hip pain   . Chronic pain    Past Surgical History  Procedure Laterality Date  . Cholecystectomy    . Tumor removal     History  Substance Use Topics  . Smoking status: Current Every Day Smoker -- 0.33 packs/day    Types: Cigarettes  . Smokeless tobacco: Not on file  . Alcohol Use: No      Review of Systems Review of systems has been recorded reviewed and signed and scanned into the chart     Objective:   Physical Exam BP 147/92  Ht 5\' 7"  (1.702 m)  Wt 146 lb (66.225 kg)  BMI 22.86 kg/m2   Vital signs are stable as recorded  General appearance is normal, body habitus small ectomorphic  The patient is alert and oriented x 3  The patient's mood and affect are normal  Gait assessment: Antalgic  The cardiovascular exam reveals normal pulses and temperature without edema or  swelling.  The lymphatic system is negative for palpable lymph nodes  The sensory exam is abnormal with decreased sensation in the L4 and L5 distribution of the left lower extremity There are no pathologic reflexes.  Balance is normal.   Exam of the lumbar spine shows tenderness in the lower lumbar spine and on the left side of the back with increased muscle tension decreased range of motion. Skin intact muscle tone  tenths.  Lower extremities show normal range of motion of both hips both hips are stable strength is normal in the right and left thigh muscles. The skin of the left and right leg is normal without rash or laceration Strength grade 5 motor strength  Skin normal, no rash, or laceration.   A/P Imaging studies show previous MRI in 2012 September with spinal stenosis  Again recommend epidural steroid injections     Assessment:     Encounter Diagnoses  Name Primary?  . Left-sided low back pain with left-sided sciatica   . DDD (degenerative disc disease), lumbar   . Spinal stenosis of lumbar region Yes        Plan:     Meds ordered this encounter  Medications  . predniSONE (STERAPRED UNI-PAK) 10 MG tablet    Sig: Take by mouth daily. USE AS DIRECTED    Dispense:  48 tablet    Refill:  0  . acetaminophen-codeine (TYLENOL #3) 300-30 MG per tablet    Sig: Take 1 tablet by mouth every 4 (four) hours as needed for moderate pain.    Dispense:  60 tablet    Refill:  2   Epidural steroid injections

## 2013-12-07 ENCOUNTER — Telehealth: Payer: Self-pay | Admitting: Orthopedic Surgery

## 2013-12-07 NOTE — Telephone Encounter (Signed)
Returned patients call, she has received appointment for her [facility]

## 2013-12-07 NOTE — Telephone Encounter (Signed)
Patient called regarding referral for epidural steroid injection per office visit 12/01/13.  Please advise, ph# [phone].

## 2013-12-12 ENCOUNTER — Ambulatory Visit
Admission: RE | Admit: 2013-12-12 | Discharge: 2013-12-12 | Disposition: A | Discharge status: Home / Self Care | Payer: Medicare Other | Source: Ambulatory Visit | Attending: Orthopedic Surgery | Admitting: Orthopedic Surgery

## 2013-12-12 VITALS — BP 140/75 | HR 68

## 2013-12-12 DIAGNOSIS — M5136 Other intervertebral disc degeneration, lumbar region: Secondary | ICD-10-CM

## 2013-12-12 MED ORDER — METHYLPREDNISOLONE ACETATE 40 MG/ML INJ SUSP (RADIOLOG
120.0000 mg | Freq: Once | INTRAMUSCULAR | Status: AC
  Administered 2013-12-12: 120 mg via EPIDURAL

## 2013-12-12 MED ORDER — IOHEXOL 180 MG/ML  SOLN
1.0000 mL | Freq: Once | INTRAMUSCULAR | Status: AC | PRN
  Administered 2013-12-12: 1 mL via EPIDURAL

## 2013-12-12 NOTE — Discharge Instructions (Signed)

## 2013-12-21 ENCOUNTER — Telehealth: Payer: Self-pay | Admitting: Orthopedic Surgery

## 2013-12-21 NOTE — Telephone Encounter (Signed)
Routing to Dr Harrison 

## 2013-12-21 NOTE — Telephone Encounter (Signed)
Patient called, states had the epidural steroid injection as ordered.  States is having leg pain, and also some constipation, and thinks it is related to medication: acetaminophen-codeine (TYLENOL #3) 300-30 MG per tablet [[id number]].  Her ph# is [phone]

## 2013-12-22 ENCOUNTER — Telehealth: Payer: Self-pay | Admitting: *Deleted

## 2013-12-22 NOTE — Telephone Encounter (Signed)
PATIENT AWARE

## 2013-12-22 NOTE — Telephone Encounter (Signed)
PATIENT STATES EPIDURAL STEROID INJECTION WORKED WELL, WOULD LIKE ANOTHER, OKAY TO ORDER?

## 2013-12-22 NOTE — Telephone Encounter (Signed)
MIRALAX DAILY UNTIL BM BECOME REGULAR

## 2013-12-23 ENCOUNTER — Other Ambulatory Visit: Payer: Self-pay | Admitting: *Deleted

## 2013-12-23 DIAGNOSIS — M5136 Other intervertebral disc degeneration, lumbar region: Secondary | ICD-10-CM

## 2013-12-23 NOTE — Telephone Encounter (Signed)
NOTED

## 2013-12-23 NOTE — Telephone Encounter (Signed)
Epidurals  Once we order the series of 3 , they can get up to 3  No new order needed (unless its > 6 months) They call [place] [facility] and set it up

## 2014-02-16 ENCOUNTER — Emergency Department (HOSPITAL_COMMUNITY): Payer: Medicare Other

## 2014-02-16 ENCOUNTER — Encounter (HOSPITAL_COMMUNITY): Payer: Self-pay

## 2014-02-16 ENCOUNTER — Observation Stay (HOSPITAL_COMMUNITY)
Admission: EM | Admit: 2014-02-16 | Discharge: 2014-02-17 | Disposition: A | Discharge status: Home / Self Care | Payer: Medicare Other | Attending: Internal Medicine | Admitting: Internal Medicine

## 2014-02-16 DIAGNOSIS — I252 Old myocardial infarction: Secondary | ICD-10-CM | POA: Diagnosis not present

## 2014-02-16 DIAGNOSIS — R072 Precordial pain: Secondary | ICD-10-CM

## 2014-02-16 DIAGNOSIS — Z791 Long term (current) use of non-steroidal anti-inflammatories (NSAID): Secondary | ICD-10-CM | POA: Diagnosis not present

## 2014-02-16 DIAGNOSIS — R079 Chest pain, unspecified: Principal | ICD-10-CM

## 2014-02-16 DIAGNOSIS — G8929 Other chronic pain: Secondary | ICD-10-CM | POA: Insufficient documentation

## 2014-02-16 DIAGNOSIS — I10 Essential (primary) hypertension: Secondary | ICD-10-CM | POA: Diagnosis not present

## 2014-02-16 DIAGNOSIS — Z7982 Long term (current) use of aspirin: Secondary | ICD-10-CM | POA: Insufficient documentation

## 2014-02-16 DIAGNOSIS — Z720 Tobacco use: Secondary | ICD-10-CM | POA: Insufficient documentation

## 2014-02-16 DIAGNOSIS — J45909 Unspecified asthma, uncomplicated: Secondary | ICD-10-CM | POA: Diagnosis not present

## 2014-02-16 DIAGNOSIS — Z79899 Other long term (current) drug therapy: Secondary | ICD-10-CM | POA: Insufficient documentation

## 2014-02-16 DIAGNOSIS — Z7951 Long term (current) use of inhaled steroids: Secondary | ICD-10-CM | POA: Insufficient documentation

## 2014-02-16 DIAGNOSIS — Z792 Long term (current) use of antibiotics: Secondary | ICD-10-CM | POA: Diagnosis not present

## 2014-02-16 DIAGNOSIS — Z23 Encounter for immunization: Secondary | ICD-10-CM | POA: Insufficient documentation

## 2014-02-16 DIAGNOSIS — Z86011 Personal history of benign neoplasm of the brain: Secondary | ICD-10-CM | POA: Diagnosis not present

## 2014-02-16 DIAGNOSIS — Z7952 Long term (current) use of systemic steroids: Secondary | ICD-10-CM | POA: Diagnosis not present

## 2014-02-16 HISTORY — DX: Non-ST elevation (NSTEMI) myocardial infarction: I21.4

## 2014-02-16 LAB — BASIC METABOLIC PANEL
Anion gap: 13 (ref 5–15)
BUN: 10 mg/dL (ref 6–23)
CO2: 25 mEq/L (ref 19–32)
Calcium: 9.4 mg/dL (ref 8.4–10.5)
Chloride: 105 mEq/L (ref 96–112)
Creatinine, Ser: 0.63 mg/dL (ref 0.50–1.10)
Glucose, Bld: 185 mg/dL — ABNORMAL HIGH (ref 70–99)
Potassium: 3.5 mEq/L — ABNORMAL LOW (ref 3.7–5.3)
SODIUM: 143 meq/L (ref 137–147)

## 2014-02-16 LAB — CBC WITH DIFFERENTIAL/PLATELET
BASOS PCT: 0 % (ref 0–1)
Basophils Absolute: 0 10*3/uL (ref 0.0–0.1)
EOS ABS: 0.1 10*3/uL (ref 0.0–0.7)
Eosinophils Relative: 1 % (ref 0–5)
HCT: 40.3 % (ref 36.0–46.0)
Hemoglobin: 14 g/dL (ref 12.0–15.0)
Lymphocytes Relative: 42 % (ref 12–46)
Lymphs Abs: 2.5 10*3/uL (ref 0.7–4.0)
MCH: 29.4 pg (ref 26.0–34.0)
MCHC: 34.7 g/dL (ref 30.0–36.0)
MCV: 84.5 fL (ref 78.0–100.0)
Monocytes Absolute: 0.4 10*3/uL (ref 0.1–1.0)
Monocytes Relative: 7 % (ref 3–12)
NEUTROS PCT: 50 % (ref 43–77)
Neutro Abs: 2.9 10*3/uL (ref 1.7–7.7)
PLATELETS: 224 10*3/uL (ref 150–400)
RBC: 4.77 MIL/uL (ref 3.87–5.11)
RDW: 14.6 % (ref 11.5–15.5)
WBC: 5.9 10*3/uL (ref 4.0–10.5)

## 2014-02-16 LAB — I-STAT TROPONIN, ED: Troponin i, poc: 0 ng/mL (ref 0.00–0.08)

## 2014-02-16 LAB — TROPONIN I
Troponin I: <0.3 ng/mL (ref <0.30)
Troponin I: <0.3 ng/mL (ref <0.30)

## 2014-02-16 MED ORDER — ALBUTEROL SULFATE (2.5 MG/3ML) 0.083% IN NEBU: 3.0000 mL | INHALATION_SOLUTION | Freq: Four times a day (QID) | RESPIRATORY_TRACT | Status: DC | PRN

## 2014-02-16 MED ORDER — SODIUM CHLORIDE 0.9 % IJ SOLN
3.0000 mL | Freq: Two times a day (BID) | INTRAMUSCULAR | Status: DC
  Administered 2014-02-16 – 2014-02-17 (×2): 3 mL via INTRAVENOUS

## 2014-02-16 MED ORDER — ASPIRIN EC 81 MG PO TBEC
81.0000 mg | DELAYED_RELEASE_TABLET | Freq: Every day | ORAL | Status: DC
  Administered 2014-02-17: 81 mg via ORAL
  Filled 2014-02-16: qty 1

## 2014-02-16 MED ORDER — ASPIRIN EC 81 MG PO TBEC: 81.0000 mg | DELAYED_RELEASE_TABLET | Freq: Every day | ORAL | Status: DC

## 2014-02-16 MED ORDER — ASPIRIN 81 MG PO CHEW
324.0000 mg | CHEWABLE_TABLET | Freq: Once | ORAL | Status: AC
  Administered 2014-02-16: 324 mg via ORAL
  Filled 2014-02-16: qty 4

## 2014-02-16 MED ORDER — AMITRIPTYLINE HCL 50 MG PO TABS
150.0000 mg | ORAL_TABLET | Freq: Every day | ORAL | Status: DC
  Administered 2014-02-16: 150 mg via ORAL
  Filled 2014-02-16 (×4): qty 3
  Filled 2014-02-16: qty 6

## 2014-02-16 MED ORDER — GABAPENTIN 300 MG PO CAPS
300.0000 mg | ORAL_CAPSULE | Freq: Every day | ORAL | Status: DC
  Administered 2014-02-16: 300 mg via ORAL
  Filled 2014-02-16: qty 1

## 2014-02-16 MED ORDER — ALBUTEROL SULFATE HFA 108 (90 BASE) MCG/ACT IN AERS
2.0000 | INHALATION_SPRAY | Freq: Four times a day (QID) | RESPIRATORY_TRACT | Status: DC | PRN
  Filled 2014-02-16: qty 6.7

## 2014-02-16 MED ORDER — ISOSORBIDE MONONITRATE ER 60 MG PO TB24
60.0000 mg | ORAL_TABLET | Freq: Every day | ORAL | Status: DC
  Administered 2014-02-16 – 2014-02-17 (×2): 60 mg via ORAL
  Filled 2014-02-16 (×5): qty 1

## 2014-02-16 MED ORDER — DULOXETINE HCL 30 MG PO CPEP
30.0000 mg | ORAL_CAPSULE | Freq: Every day | ORAL | Status: DC
  Administered 2014-02-16 – 2014-02-17 (×2): 30 mg via ORAL
  Filled 2014-02-16 (×5): qty 1

## 2014-02-16 MED ORDER — NITROGLYCERIN 0.4 MG SL SUBL
0.4000 mg | SUBLINGUAL_TABLET | SUBLINGUAL | Status: DC | PRN
  Filled 2014-02-16: qty 1

## 2014-02-16 MED ORDER — MOMETASONE FURO-FORMOTEROL FUM 200-5 MCG/ACT IN AERO: 2.0000 | INHALATION_SPRAY | Freq: Two times a day (BID) | RESPIRATORY_TRACT | Status: DC

## 2014-02-16 MED ORDER — NITROGLYCERIN 0.4 MG SL SUBL
0.4000 mg | SUBLINGUAL_TABLET | SUBLINGUAL | Status: DC | PRN
  Administered 2014-02-16: 0.4 mg via SUBLINGUAL

## 2014-02-16 MED ORDER — AMITRIPTYLINE HCL 50 MG PO TABS
ORAL_TABLET | ORAL | Status: AC
  Filled 2014-02-16: qty 3

## 2014-02-16 MED ORDER — ONDANSETRON HCL 4 MG/2ML IJ SOLN: 4.0000 mg | Freq: Four times a day (QID) | INTRAMUSCULAR | Status: DC | PRN

## 2014-02-16 MED ORDER — BECLOMETHASONE DIPROPIONATE 80 MCG/ACT IN AERS
2.0000 | INHALATION_SPRAY | Freq: Every day | RESPIRATORY_TRACT | Status: DC
  Filled 2014-02-16: qty 8.7

## 2014-02-16 MED ORDER — INFLUENZA VAC SPLIT QUAD 0.5 ML IM SUSY
0.5000 mL | PREFILLED_SYRINGE | INTRAMUSCULAR | Status: AC
  Administered 2014-02-17: 0.5 mL via INTRAMUSCULAR
  Filled 2014-02-16: qty 0.5

## 2014-02-16 MED ORDER — TRAZODONE HCL 50 MG PO TABS
100.0000 mg | ORAL_TABLET | Freq: Every day | ORAL | Status: DC
  Administered 2014-02-16: 100 mg via ORAL
  Filled 2014-02-16: qty 2

## 2014-02-16 MED ORDER — ALENDRONATE SODIUM 70 MG PO TABS
70.0000 mg | ORAL_TABLET | ORAL | Status: DC
Start: 2014-02-16 — End: 2014-02-16

## 2014-02-16 MED ORDER — ACETAMINOPHEN-CODEINE #3 300-30 MG PO TABS
1.0000 | ORAL_TABLET | ORAL | Status: DC | PRN
  Administered 2014-02-16 – 2014-02-17 (×3): 1 via ORAL
  Filled 2014-02-16 (×3): qty 1

## 2014-02-16 MED ORDER — ONDANSETRON HCL 4 MG PO TABS: 4.0000 mg | ORAL_TABLET | Freq: Four times a day (QID) | ORAL | Status: DC | PRN

## 2014-02-16 MED ORDER — HEPARIN SODIUM (PORCINE) 5000 UNIT/ML IJ SOLN
5000.0000 [IU] | Freq: Three times a day (TID) | INTRAMUSCULAR | Status: DC
  Administered 2014-02-16 – 2014-02-17 (×3): 5000 [IU] via SUBCUTANEOUS
  Filled 2014-02-16 (×3): qty 1

## 2014-02-16 NOTE — ED Notes (Signed)
Took three Nitro yesterday and they helped, but it came back.

## 2014-02-16 NOTE — ED Notes (Signed)
Chest pain that started last Friday, hurting into right shoulder. Feels like someone is standing on my chest.

## 2014-02-16 NOTE — ED Provider Notes (Signed)
CSN: [id number]     Arrival date & time 02/16/14  1418 History  This chart was scribed for Courtney Cable, MD by Rayfield Citizen, ED Scribe. This patient was seen in room APA10/APA10 and the patient's care was started at 2:52 PM.    Chief Complaint  Patient presents with  . Chest Pain   Patient is a 63 y.o. female presenting with chest pain. The history is provided by the patient. No language interpreter was used.  Chest Pain Pain quality: pressure and radiating   Pain radiates to:  R shoulder Pain radiates to the back: yes   Pain severity:  Moderate Onset quality:  Gradual Duration:  1 week Timing:  Intermittent Progression:  Waxing and waning Relieved by:  Nitroglycerin Associated symptoms: diaphoresis and weakness   Associated symptoms: no abdominal pain and not vomiting      HPI Comments: Courtney Zuniga is a 63 y.o. female who presents to the Emergency Department complaining of 1 week of intermittent chest pain, with sharp pains radiating into her right shoulder and back. She describes her pain as intermittent pressure, as though someone is standing on her chest, with associated diaphoresis and weakness. She reports that she took three doses of her nitroglycerin yesterday with improvement in her symptoms, but her symptoms soon returned. She denies syncope, vomiting, diarrhea, abdominal pain, unilateral weakness, hemoptysis.   She takes baby aspirin every day; she took her normal dose today.   She denies a history of DVT or PE. She is unsure of her heart history; she does not have any stents in and she does not see a cardiologist at this time.    Past Medical History  Diagnosis Date  . Brain tumor (benign)   . Hypertension   . Asthma   . Chronic headaches   . Chronic hip pain   . Chronic pain   . NSTEMI (non-ST elevated myocardial infarction)    Past Surgical History  Procedure Laterality Date  . Cholecystectomy    . Tumor removal     Family History  Problem Relation  Age of Onset  . Cancer    . Asthma     History  Substance Use Topics  . Smoking status: Current Every Day Smoker -- 0.33 packs/day    Types: Cigarettes  . Smokeless tobacco: Not on file  . Alcohol Use: No   OB History    No data available     Review of Systems  Constitutional: Positive for diaphoresis.  Cardiovascular: Positive for chest pain.  Gastrointestinal: Negative for vomiting, abdominal pain and diarrhea.  Neurological: Positive for weakness. Negative for syncope.  All other systems reviewed and are negative.   Allergies  Review of patient's allergies indicates no known allergies.  Home Medications   Prior to Admission medications   Medication Sig Start Date End Date Taking? Authorizing Provider  acetaminophen-codeine (TYLENOL #3) 300-30 MG per tablet Take 1 tablet by mouth every 4 (four) hours as needed for moderate pain. 12/01/13  Yes Carole Civil, MD  albuterol (PROVENTIL HFA;VENTOLIN HFA) 108 (90 BASE) MCG/ACT inhaler Inhale 2 puffs into the lungs every 6 (six) hours as needed. Shortness of Breath   Yes Historical Provider, MD  alendronate (FOSAMAX) 70 MG tablet Take 70 mg by mouth every 7 (seven) days. Take with a full glass of water on an empty stomach.   Yes Historical Provider, MD  amitriptyline (ELAVIL) 75 MG tablet Take 150 mg by mouth at bedtime.   Yes Historical Provider,  MD  aspirin EC 81 MG tablet Take 81 mg by mouth daily.   Yes Historical Provider, MD  Aspirin-Caffeine (BC FAST PAIN RELIEF) 845-65 MG PACK Take 1 packet by mouth every 4 (four) hours as needed. Pain   Yes Historical Provider, MD  CALCIUM PO Take 1 tablet by mouth daily.   Yes Historical Provider, MD  DULoxetine (CYMBALTA) 30 MG capsule Take 30 mg by mouth. 02/15/13  Yes Historical Provider, MD  fluticasone (FLONASE) 50 MCG/ACT nasal spray 2 sprays by Each Nare route daily.   Yes Historical Provider, MD  Fluticasone-Salmeterol (ADVAIR DISKUS) 500-50 MCG/DOSE AEPB Inhale 1 puff into the  lungs daily.    Yes Historical Provider, MD  gabapentin (NEURONTIN) 300 MG capsule Take 300 mg by mouth at bedtime.   Yes Historical Provider, MD  isosorbide mononitrate (IMDUR) 60 MG 24 hr tablet Take 60 mg by mouth daily.  09/22/13  Yes Historical Provider, MD  naproxen (NAPROSYN) 500 MG tablet Take 1 tablet by mouth 3 (three) times daily. 02/14/14  Yes Historical Provider, MD  nitroGLYCERIN (NITROSTAT) 0.4 MG SL tablet Place 0.4 mg under the tongue every 5 (five) minutes as needed for chest pain.  02/15/13  Yes Historical Provider, MD  QVAR 80 MCG/ACT inhaler Inhale 2 puffs into the lungs daily. 02/13/14  Yes Historical Provider, MD  traZODone (DESYREL) 100 MG tablet Take 100 mg by mouth at bedtime.   Yes Historical Provider, MD  azithromycin (ZITHROMAX) 250 MG tablet Take 1 tablet (250 mg total) by mouth daily. Take first 2 tablets together, then 1 every day until finished. Patient not taking: Reported on 02/16/2014 06/15/13   Courtney Zuniga. Pickering, MD  cyclobenzaprine (FLEXERIL) 10 MG tablet Take 10 mg by mouth.    Historical Provider, MD  DULoxetine (CYMBALTA) 30 MG capsule  09/22/13   Historical Provider, MD  Fluticasone-Salmeterol (ADVAIR) 500-50 MCG/DOSE AEPB Inhale 1 puff into the lungs every 12 (twelve) hours.    Historical Provider, MD  predniSONE (DELTASONE) 20 MG tablet Take 3 tablets (60 mg total) by mouth daily. Patient not taking: Reported on 02/16/2014 06/15/13   Courtney Zuniga. Pickering, MD  predniSONE (STERAPRED UNI-PAK) 10 MG tablet Take by mouth daily. USE AS DIRECTED Patient not taking: Reported on 02/16/2014 12/01/13   Carole Civil, MD   BP 146/89 mmHg  Pulse 95  Temp(Src) 98.2 F (36.8 C) (Oral)  Resp 22  Ht 5' 6.5" (1.689 m)  Wt 151 lb (68.493 kg)  BMI 24.01 kg/m2  SpO2 98% Physical Exam  Nursing note and vitals reviewed.    CONSTITUTIONAL: Well developed/well nourished HEAD: Normocephalic/atraumatic EYES: EOMI/PERRL ENMT: Mucous membranes moist NECK: supple no meningeal  signs SPINE:entire spine nontender CV: S1/S2 noted, no murmurs/rubs/gallops noted LUNGS: Lungs are clear to auscultation bilaterally, no apparent distress ABDOMEN: soft, nontender, no rebound or guarding GU:no cva tenderness NEURO: Pt is awake/alert, moves all extremitiesx4 EXTREMITIES: pulses normal, full ROM SKIN: warm, color normal PSYCH: no abnormalities of mood noted   ED Course  Procedures   DIAGNOSTIC STUDIES: Oxygen Saturation is 98% on RA, normal by my interpretation.    COORDINATION OF CARE: 2:55 PM Discussed treatment plan with pt at bedside and pt agreed to plan.  5:11 PM CP is resolved at this time She is well appearing Initial EKG/Troponin negative She reports possible h/o CAD and diagnosed at [facility], but review of care everywhere does not reveal any recent cardiac cath as far as I can tell Will admit for chest pain evaluation  I doubt acute PE at this time.  Low suspicion for aortic dissection D/w dr Anastasio Champion will admit Labs Review Labs Reviewed  BASIC METABOLIC PANEL - Abnormal; Notable for the following:    Potassium 3.5 (*)    Glucose, Bld 185 (*)    All other components within normal limits  CBC WITH DIFFERENTIAL  I-STAT TROPOININ, ED    Imaging Review Dg Chest Portable 1 View  02/16/2014   CLINICAL DATA:  Mid chest pain radiating into right shoulder  EXAM: PORTABLE CHEST - 1 VIEW  COMPARISON:  06/15/2013  FINDINGS: Normal heart size. No pleural effusion or edema. The lungs are hyperinflated and there are coarsened interstitial markings identified bilaterally. Scarring is noted in both lower lobes.  IMPRESSION: 1. No acute cardiopulmonary abnormalities. 2. Bilateral lower lobe scarring.   Electronically Signed   By: Kerby Moors M.D.   On: 02/16/2014 15:06     EKG Interpretation   Date/Time:  Thursday February 16 2014 14:27:37 EST Ventricular Rate:  85 PR Interval:  149 QRS Duration: 82 QT Interval:  355 QTC Calculation: 422 R Axis:   -52 Text  Interpretation:  Sinus rhythm Left axis deviation Anterior infarct,  old No significant change since last tracing ARTIFACT NOTED Confirmed by  Christy Gentles  MD, [place] ([id number]) on 02/16/2014 2:47:17 PM      MDM   Final diagnoses:  Chest pain  Chest pain, rule out acute myocardial infarction    Nursing notes including past medical history and social history reviewed and considered in documentation Labs/vital reviewed and considered Previous records reviewed and considered xrays reviewed and considered   I personally performed the services described in this documentation, which was scribed in my presence. The recorded information has been reviewed and is accurate.       Courtney Cable, MD 02/16/14 [phone]

## 2014-02-16 NOTE — ED Notes (Signed)
Patient c/o of 7/10 chest pain; HR 74 BP 141/96. First sublingual nitro given.

## 2014-02-16 NOTE — ED Notes (Signed)
Report given to [facility] on Dept 300.

## 2014-02-16 NOTE — H&P (Addendum)
[facility] History and Physical  Courtney Zuniga GYJ:[id number] DOB: 1951/02/15 DOA: 02/16/2014  Referring physician: ER PCP: Petra Kuba, MD   Chief Complaint: chest pain  HPI: Courtney Zuniga is a 63 y.o. female  This is a 63 year old lady, who apparently has a history of coronary artery disease and she was diagnosed with this at another hospital, presents with a week history of intermittent chest pain. She describes this as a tightness. She says it has lasted all day today and when she takes Nitrolingual under the tongue, it seems to help.She has a history of hypertension and is a smoker. She also has a history of chronic pain and takes narcotics.   Review of Systems:  Constitutional:  No weight loss, night sweats, Fevers, chills, fatigue.  HEENT:  No headaches, Difficulty swallowing,Tooth/dental problems,Sore throat,  No sneezing, itching, ear ache, nasal congestion, post nasal drip,   GI:  No heartburn, indigestion, abdominal pain, nausea, vomiting, diarrhea, change in bowel habits, loss of appetite  Resp:  No shortness of breath with exertion or at rest. No excess mucus, no productive cough, No non-productive cough, No coughing up of blood.No change in color of mucus.No wheezing.No chest wall deformity  Skin:  no rash or lesions.  GU:  no dysuria, change in color of urine, no urgency or frequency. No flank pain.  Musculoskeletal:  No joint pain or swelling. No decreased range of motion. No back pain.  Psych:  No change in mood or affect. No depression or anxiety. No memory loss.   Past Medical History  Diagnosis Date  . Brain tumor (benign)   . Hypertension   . Asthma   . Chronic headaches   . Chronic hip pain   . Chronic pain   . NSTEMI (non-ST elevated myocardial infarction)    Past Surgical History  Procedure Laterality Date  . Cholecystectomy    . Tumor removal     Social History:  reports that she has been smoking Cigarettes.  She has been  smoking about 0.33 packs per day. She does not have any smokeless tobacco history on file. She reports that she does not drink alcohol or use illicit drugs.  No Known Allergies  Family History  Problem Relation Age of Onset  . Cancer    . Asthma       Prior to Admission medications   Medication Sig Start Date End Date Taking? Authorizing Provider  acetaminophen-codeine (TYLENOL #3) 300-30 MG per tablet Take 1 tablet by mouth every 4 (four) hours as needed for moderate pain. 12/01/13  Yes Carole Civil, MD  albuterol (PROVENTIL HFA;VENTOLIN HFA) 108 (90 BASE) MCG/ACT inhaler Inhale 2 puffs into the lungs every 6 (six) hours as needed. Shortness of Breath   Yes Historical Provider, MD  alendronate (FOSAMAX) 70 MG tablet Take 70 mg by mouth every 7 (seven) days. Take with a full glass of water on an empty stomach.   Yes Historical Provider, MD  amitriptyline (ELAVIL) 75 MG tablet Take 150 mg by mouth at bedtime.   Yes Historical Provider, MD  aspirin EC 81 MG tablet Take 81 mg by mouth daily.   Yes Historical Provider, MD  Aspirin-Caffeine (BC FAST PAIN RELIEF) 845-65 MG PACK Take 1 packet by mouth every 4 (four) hours as needed. Pain   Yes Historical Provider, MD  CALCIUM PO Take 1 tablet by mouth daily.   Yes Historical Provider, MD  DULoxetine (CYMBALTA) 30 MG capsule Take 30 mg by mouth. 02/15/13  Yes Historical  Provider, MD  fluticasone (FLONASE) 50 MCG/ACT nasal spray 2 sprays by Each Nare route daily.   Yes Historical Provider, MD  Fluticasone-Salmeterol (ADVAIR DISKUS) 500-50 MCG/DOSE AEPB Inhale 1 puff into the lungs daily.    Yes Historical Provider, MD  gabapentin (NEURONTIN) 300 MG capsule Take 300 mg by mouth at bedtime.   Yes Historical Provider, MD  isosorbide mononitrate (IMDUR) 60 MG 24 hr tablet Take 60 mg by mouth daily.  09/22/13  Yes Historical Provider, MD  naproxen (NAPROSYN) 500 MG tablet Take 1 tablet by mouth 3 (three) times daily. 02/14/14  Yes Historical Provider, MD   nitroGLYCERIN (NITROSTAT) 0.4 MG SL tablet Place 0.4 mg under the tongue every 5 (five) minutes as needed for chest pain.  02/15/13  Yes Historical Provider, MD  QVAR 80 MCG/ACT inhaler Inhale 2 puffs into the lungs daily. 02/13/14  Yes Historical Provider, MD  traZODone (DESYREL) 100 MG tablet Take 100 mg by mouth at bedtime.   Yes Historical Provider, MD  azithromycin (ZITHROMAX) 250 MG tablet Take 1 tablet (250 mg total) by mouth daily. Take first 2 tablets together, then 1 every day until finished. Patient not taking: Reported on 02/16/2014 06/15/13   [name]. Pickering, MD  cyclobenzaprine (FLEXERIL) 10 MG tablet Take 10 mg by mouth.    Historical Provider, MD  DULoxetine (CYMBALTA) 30 MG capsule  09/22/13   Historical Provider, MD  Fluticasone-Salmeterol (ADVAIR) 500-50 MCG/DOSE AEPB Inhale 1 puff into the lungs every 12 (twelve) hours.    Historical Provider, MD  predniSONE (DELTASONE) 20 MG tablet Take 3 tablets (60 mg total) by mouth daily. Patient not taking: Reported on 02/16/2014 06/15/13   [name]. Pickering, MD  predniSONE (STERAPRED UNI-PAK) 10 MG tablet Take by mouth daily. USE AS DIRECTED Patient not taking: Reported on 02/16/2014 12/01/13   Carole Civil, MD   Physical Exam: Filed Vitals:   02/16/14 1430 02/16/14 1500 02/16/14 1530 02/16/14 1600  BP: 147/87 132/94 141/89 139/98  Pulse: 78 86 71 72  Temp:      TempSrc:      Resp: 22 28 25 18   Height:      Weight:      SpO2: 98% 98% 97% 98%    Wt Readings from Last 3 Encounters:  02/16/14 68.493 kg (151 lb)  12/01/13 66.225 kg (146 lb)  06/15/13 70.761 kg (156 lb)    General:  Appears calm and comfortable Eyes: PERRL, normal lids, irises & conjunctiva ENT: grossly normal hearing, lips & tongue Neck: no LAD, masses or thyromegaly Cardiovascular: RRR, no m/r/g. No LE edema. Telemetry: SR, no arrhythmias  Respiratory: CTA bilaterally, no w/r/r. Normal respiratory effort. Abdomen: soft, ntnd Skin: no rash or  induration seen on limited exam Musculoskeletal: grossly normal tone BUE/BLE. She appears to be tender in the anterior chest wall, more on the right than the left. This reproduces her pain. Psychiatric: grossly normal mood and affect, speech fluent and appropriate Neurologic: grossly non-focal.          Labs on Admission:  Basic Metabolic Panel:  Recent Labs Lab 02/16/14 1454  NA 143  K 3.5*  CL 105  CO2 25  GLUCOSE 185*  BUN 10  CREATININE 0.63  CALCIUM 9.4   Liver Function Tests: No results for input(s): AST, ALT, ALKPHOS, BILITOT, PROT, ALBUMIN in the last 168 hours. No results for input(s): LIPASE, AMYLASE in the last 168 hours. No results for input(s): AMMONIA in the last 168 hours. CBC:  Recent  Labs Lab 02/16/14 1454  WBC 5.9  NEUTROABS 2.9  HGB 14.0  HCT 40.3  MCV 84.5  PLT 224   Cardiac Enzymes: No results for input(s): CKTOTAL, CKMB, CKMBINDEX, TROPONINI in the last 168 hours.  BNP (last 3 results) No results for input(s): PROBNP in the last 8760 hours. CBG: No results for input(s): GLUCAP in the last 168 hours.  Radiological Exams on Admission: Dg Chest Portable 1 View  02/16/2014   CLINICAL DATA:  Mid chest pain radiating into right shoulder  EXAM: PORTABLE CHEST - 1 VIEW  COMPARISON:  06/15/2013  FINDINGS: Normal heart size. No pleural effusion or edema. The lungs are hyperinflated and there are coarsened interstitial markings identified bilaterally. Scarring is noted in both lower lobes.  IMPRESSION: 1. No acute cardiopulmonary abnormalities. 2. Bilateral lower lobe scarring.   Electronically Signed   By: Kerby Moors M.D.   On: 02/16/2014 15:06    EKG: Independently reviewed. Normal sinus rhythm without any acute ST-T wave elevations.  Assessment/Plan   1. Chest pain, unclear etiology. She appears to be tender in the anterior chest wall, reproducing her pain although she does have risk factors for coronary artery disease and supposedly has a  history of non-ST elevation MI. 2. Hypertension. 3. Chronic pain. 4. Asthma. 5. Hyperglycemia  Plan: 1. Admit to telemetry. 2. Serial cardiac enzymes. 3. Cardiology consultation. 4. Check hemoglobin A1c.  Further recommendations will depend on patient's hospital progress.   Code Status: full code.  DVT Prophylaxis:heparin.  Family Communication: I discussed the plan with the patient at the bedside.  Disposition Plan: home when medically stable.   Time spent: 60 minutes.  Doree Albee [facility] Pager [phone].

## 2014-02-16 NOTE — ED Notes (Signed)
Pt denies all pain at this time.

## 2014-02-17 ENCOUNTER — Encounter (HOSPITAL_COMMUNITY): Payer: Self-pay | Admitting: Adult Health

## 2014-02-17 DIAGNOSIS — R079 Chest pain, unspecified: Secondary | ICD-10-CM | POA: Insufficient documentation

## 2014-02-17 DIAGNOSIS — J45909 Unspecified asthma, uncomplicated: Secondary | ICD-10-CM | POA: Insufficient documentation

## 2014-02-17 DIAGNOSIS — R0789 Other chest pain: Secondary | ICD-10-CM

## 2014-02-17 DIAGNOSIS — R072 Precordial pain: Secondary | ICD-10-CM

## 2014-02-17 DIAGNOSIS — G8929 Other chronic pain: Secondary | ICD-10-CM | POA: Diagnosis not present

## 2014-02-17 DIAGNOSIS — I10 Essential (primary) hypertension: Secondary | ICD-10-CM | POA: Diagnosis not present

## 2014-02-17 DIAGNOSIS — I519 Heart disease, unspecified: Secondary | ICD-10-CM

## 2014-02-17 DIAGNOSIS — R0600 Dyspnea, unspecified: Secondary | ICD-10-CM

## 2014-02-17 LAB — COMPREHENSIVE METABOLIC PANEL WITH GFR
ALT: 7 U/L (ref 0–35)
AST: 14 U/L (ref 0–37)
Albumin: 3.5 g/dL (ref 3.5–5.2)
Alkaline Phosphatase: 185 U/L — ABNORMAL HIGH (ref 39–117)
Anion gap: 13 (ref 5–15)
BUN: 13 mg/dL (ref 6–23)
CO2: 25 meq/L (ref 19–32)
Calcium: 9.2 mg/dL (ref 8.4–10.5)
Chloride: 104 meq/L (ref 96–112)
Creatinine, Ser: 0.64 mg/dL (ref 0.50–1.10)
GFR calc Af Amer: >90 mL/min
GFR calc non Af Amer: >90 mL/min
Glucose, Bld: 91 mg/dL (ref 70–99)
Potassium: 4 meq/L (ref 3.7–5.3)
Sodium: 142 meq/L (ref 137–147)
Total Bilirubin: 0.5 mg/dL (ref 0.3–1.2)
Total Protein: 6.5 g/dL (ref 6.0–8.3)

## 2014-02-17 LAB — HEMOGLOBIN A1C
Hgb A1c MFr Bld: 5.7 % — ABNORMAL HIGH
Mean Plasma Glucose: 117 mg/dL — ABNORMAL HIGH

## 2014-02-17 LAB — TSH: TSH: 1.38 u[IU]/mL (ref 0.350–4.500)

## 2014-02-17 LAB — TROPONIN I

## 2014-02-17 MED ORDER — FLUTICASONE PROPIONATE HFA 44 MCG/ACT IN AERO
2.0000 | INHALATION_SPRAY | Freq: Two times a day (BID) | RESPIRATORY_TRACT | Status: DC
  Filled 2014-02-17: qty 10.6

## 2014-02-17 MED ORDER — PANTOPRAZOLE SODIUM 40 MG PO TBEC: 40.0000 mg | DELAYED_RELEASE_TABLET | Freq: Every day | ORAL | Status: DC

## 2014-02-17 NOTE — Progress Notes (Signed)
UR completed 

## 2014-02-17 NOTE — Consult Note (Signed)
CARDIOLOGY CONSULT NOTE   Patient ID: Courtney Zuniga MRN: [id number] DOB/AGE: 11-24-1950 63 y.o.  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 Jacques Zuniga, cardiologist at [facility] in Nov of 2014 and underwent a PET Myocardial perfusion study. This was found to be negative for ischemia. She was started on isosorbide 30 mg daily and was to follow up in 3 months. She was advised to quit smoking.     She states that for the last week or so, she has had unrelenting vice like pain around her chest with worsening pain under her right scapula and middle back. She has had worsening breathing, walking from room to room in her house. Some lightheadedness, having to hold on to a wall or furniture at times. Occasional diaphoresis, alternating with being cold. She treated herself with BC powders, Tylenol and baking soda to make herself burp. No relief. Because of ongoing symptoms, she presented to ER. She admits to being out of her Imdur for a few months, because she has not seen her cardiologist. She continues to smoke 1/2 ppd.   On arrival to ER, BP 146/89. HR 95, O@ sat 98%. Labs unremarkable with the exception of hypokalemia. K+ 3.5 and elevated Hgb A1C of 5.7. CXR was negative for CHF or pneumonia. EKG, NSR with left axis deviation. Troponin is negative X 3.  She was treated with NTG and ASA and has found relief of pain,. She had been restarted on Imdur 30 mg daily, gabapentin and ASA. She is also being treated with inhalers.   No Known Allergies  Medications Scheduled Medications: . amitriptyline  150 mg Oral QHS  . aspirin EC  81 mg  Oral Daily  . DULoxetine  30 mg Oral Daily  . fluticasone  2 puff Inhalation BID  . gabapentin  300 mg Oral QHS  . heparin  5,000 Units Subcutaneous 3 times per day  . Influenza vac split quadrivalent PF  0.5 mL Intramuscular Tomorrow-1000  . isosorbide mononitrate  60 mg Oral Daily  . sodium chloride  3 mL Intravenous Q12H  . traZODone  100 mg Oral QHS   PRN Medications: acetaminophen-codeine, albuterol, nitroGLYCERIN, ondansetron **OR** ondansetron (ZOFRAN) IV   Past Medical History  Diagnosis Date  . Brain tumor (benign)     Benign  . Hypertension   . Asthma   . Chronic headaches   . Chronic hip pain   . Chronic pain   . NSTEMI (non-ST elevated myocardial infarction)     Past Surgical History  Procedure Laterality Date  . Cholecystectomy    . Tumor removal      Benign    Family History  Problem Relation Age of Onset  . Cancer Mother   . Cancer Father   . Cancer Sister     Social History Courtney Zuniga reports that she has been smoking Cigarettes.  She has been smoking about 0.33 packs per day. She does not have any smokeless tobacco history on file. Courtney Zuniga reports that she does not drink alcohol.  Review of Systems Otherwise reviewed and negative except as outlined.  Physical Examination Blood pressure 100/61, pulse 65, temperature 98.4 F (36.9 C), temperature source Oral, resp.  rate 16, height 5' 6.5" (1.689 m), weight 151 lb (68.493 kg), SpO2 97 %. No intake or output data in the 24 hours ending 02/17/14 0901  Telemetry: NSR  GEN: Ill appearing in no acute distress. HEENT: Conjunctiva and lids normal, oropharynx clear with moist mucosa. Neck: Supple, no elevated JVP or carotid bruits, no thyromegaly. Lungs: Clear to auscultation, nonlabored breathing at rest. Cardiac: Regular rate and rhythm, distant heart sounds, soft systolic murmur at the apex,no S3  no pericardial rub. Abdomen: Soft, nontender, no hepatomegaly, bowel sounds present, no guarding  or rebound. Extremities: No pitting edema, distal pulses 2+. Skin: Warm and dry. Musculoskeletal: No kyphosis.She is still complaining of back pain, that she is able to pinpoint.  Neuropsychiatric: Alert and oriented x3, affect grossly appropriate.  Prior Cardiac Testing/Procedures  PET Myocardial Perfusion Study 04/01/2013 Impressions:  - No evidence for significant ischemia or scar is noted.  - During stress: Global systolic function is normal. The ejection fraction   calculated at 59%.   - Coronary calcification are noted.  - Incidentally noted are emphysematous changes and a 4 mm nodule in the   right middle lobe. Comparison with outside films is recommended if   available. If not available, then by Fleischner criteria a repeat   dedicated chest CT is recommend in 6-12 months.   Lab Results  Basic Metabolic Panel:  Recent Labs Lab 02/16/14 1454 02/17/14 0554  NA 143 142  K 3.5* 4.0  CL 105 104  CO2 25 25  GLUCOSE 185* 91  BUN 10 13  CREATININE 0.63 0.64  CALCIUM 9.4 9.2    Liver Function Tests:  Recent Labs Lab 02/17/14 0554  AST 14  ALT 7  ALKPHOS 185*  BILITOT 0.5  PROT 6.5  ALBUMIN 3.5    CBC:  Recent Labs Lab 02/16/14 1454  WBC 5.9  NEUTROABS 2.9  HGB 14.0  HCT 40.3  MCV 84.5  PLT 224    Cardiac Enzymes:  Recent Labs Lab 02/16/14 1454 02/16/14 2320 02/17/14 0554  TROPONINI <0.30 <0.30 <0.30    Radiology: Dg Chest Portable 1 View  02/16/2014   CLINICAL DATA:  Mid chest pain radiating into right shoulder  EXAM: PORTABLE CHEST - 1 VIEW  COMPARISON:  06/15/2013  FINDINGS: Normal heart size. No pleural effusion or edema. The lungs are hyperinflated and there are coarsened interstitial markings identified bilaterally. Scarring is noted in both lower lobes.  IMPRESSION: 1. No acute cardiopulmonary abnormalities. 2. Bilateral lower lobe scarring.   Electronically Signed   By: Kerby Moors M.D.   On: 02/16/2014 15:06     ECG: NSR  with Left Axis Deviation   Impression and Recommendations  1. Atypical Chest Pain: Unrelenting for over a week, described as vice-like with associated dyspnea. Back pain radiation. EKG and Troponins are negative arguing against ACS. Has had a stress myoview one year ago by cardiology at [facility], which was negative for ischemia. She has responded to long acting nitrates. Cannot rule out spasm. She is improved in symptoms with reinstitution of imdur. Would continue medical management. Check echo for LV fx.   2. Hypertension: BP is low normal now that she is back on nitrates and pain free. Had been on other antihypertensives per patient but BP too low and taken off it them, but continued on nitrates.   3. Chronic Back Pain; Being treated for DDD with steroids and and Tylenol #3 by Dr. Aline Brochure. She will need to follow up with him for ongoing  management strategies.   4. Ongoing tobacco abuse: She continues to smoke 1/2 ppd. She does not seem inclined to quit.  5. Dyspnea: CXR negative for pneumonia or CHF Question early COPD  6. Unknown Lipid status: Check lipid profile.   Signed: Phill Myron. Lawrence NP  02/17/2014, 9:01 AM Co-Sign MD  Patient seen and discussed with NP Purcell Nails, I agree with her documentation. 63 yo female hx of HTN, asthma, tobacco, and reported hx of CAD admitted with chest pain. Describes squeezing pain in mid chest radiating to right shoulder and arm 9/10 in severity, some associated SOB. Pain has been constant for one week, though some variation in severity. Worst with movement, worst with deep breaths, worst with palpation. Some improvement with drinking water and baking soda at home.   Trop neg x3, K 4.0, Cr 0.64, TSH 1.38, HgbA1c 5.7 CXR no acute process EKG SR, anteroseptal Q waves old Echo pending [facility] PET stress DEC 2014: No ischemia  Atypical chest pain in description, mainly in its reproducibility with palpation, duration being constant for 1 week, better with  water/baking soda, and worst with movement or deep breathing. No evidence of ACS by EKG or cardiac enzymes, repeat EKG pending. Stress test negative approx 1 year ago at [facility]. Will f/u echo results, if normal do not have plans for any further cardiac testing at this time. Likely MSK related pain, consider course of antiinflammatories.    Zandra Abts MD

## 2014-02-17 NOTE — Discharge Summary (Signed)
Physician Discharge Summary  Courtney Zuniga LZJ:[id number] DOB: 1950/09/25 DOA: 02/16/2014  PCP: Petra Kuba, MD  Admit date: 02/16/2014 Discharge date: 02/17/2014  Time spent: 40 minutes  Recommendations for Outpatient Follow-up:  1. Patient can follow up with her primary cardiologist at [facility] in the next few weeks  Discharge Diagnoses:  Active Problems:   Chest pain   HTN (hypertension)   Chest pain, rule out acute myocardial infarction   Asthma, chronic   Discharge Condition: improved  Diet recommendation: low salt  Filed Weights   02/16/14 1429  Weight: 68.493 kg (151 lb)    History of present illness:  This is a 63 year old lady who presents to the emergency room with complaints of chest pain. She has a history of coronary artery disease. She described chest tightness that lasted the day prior to admission. She was taking nitroglycerin sublingually. She is active smoker. She also has history of chronic pain and takes narcotics.  Hospital Course:  Patient ruled out for ACS with negative cardiac markers. EKG was nonacute. Pain appeared to be atypical. She was seen by cardiology who ordered echocardiogram which was unremarkable. It was felt that her pain was likely musculoskeletal versus GERD related. The patient was recommended to take nonsteroidal anti-inflammatories as well as proton pump inhibitors. She felt that her pain had improved since admission. Further cardiac workup was not recommended at this time. She was advised to follow-up with her primary cardiologist at [facility] next few weeks. Patient was cleared for discharge home.  Procedures:  Echo: - Left ventricle: The cavity size was normal. Wall thickness was normal. Systolic function was normal. The estimated ejection fraction was in the range of 60% to 65%. Wall motion was normal; there were no regional wall motion abnormalities. Doppler parameters are consistent with abnormal left  ventricular relaxation (grade 1 diastolic dysfunction). - Aortic valve: Mildly calcified annulus. Trileaflet; mildly thickened leaflets. - Mitral valve: Mildly calcified annulus. Mildly thickened leaflets . - Atrial septum: No defect or patent foramen ovale was identified. - Technically adequate study.  Consultations:  Cardiology  Discharge Exam: Filed Vitals:   02/17/14 1449  BP: 92/44  Pulse: 95  Temp: 98.9 F (37.2 C)  Resp: 16    General: NAD Cardiovascular: S1, S2 RRR Respiratory: CTA B  Discharge Instructions You were cared for by a hospitalist during your hospital stay. If you have any questions about your discharge medications or the care you received while you were in the hospital after you are discharged, you can call the unit and asked to speak with the hospitalist on call if the hospitalist that took care of you is not available. Once you are discharged, your primary care physician will handle any further medical issues. Please note that NO REFILLS for any discharge medications will be authorized once you are discharged, as it is imperative that you return to your primary care physician (or establish a relationship with a primary care physician if you do not have one) for your aftercare needs so that they can reassess your need for medications and monitor your lab values.  Discharge Instructions    Diet - low sodium heart healthy    Complete by:  As directed      Increase activity slowly    Complete by:  As directed           Discharge Medication List as of 02/17/2014  4:20 PM    START taking these medications   Details  pantoprazole (PROTONIX) 40  MG tablet Take 1 tablet (40 mg total) by mouth daily., Starting 02/17/2014, Until Discontinued, Print      CONTINUE these medications which have NOT CHANGED   Details  acetaminophen-codeine (TYLENOL #3) 300-30 MG per tablet Take 1 tablet by mouth every 4 (four) hours as needed for moderate pain., Starting  12/01/2013, Until Discontinued, Print    albuterol (PROVENTIL HFA;VENTOLIN HFA) 108 (90 BASE) MCG/ACT inhaler Inhale 2 puffs into the lungs every 6 (six) hours as needed. Shortness of Breath, Until Discontinued, Historical Med    alendronate (FOSAMAX) 70 MG tablet Take 70 mg by mouth every 7 (seven) days. Take with a full glass of water on an empty stomach., Until Discontinued, Historical Med    amitriptyline (ELAVIL) 75 MG tablet Take 150 mg by mouth at bedtime., Until Discontinued, Historical Med    aspirin EC 81 MG tablet Take 81 mg by mouth daily., Until Discontinued, Historical Med    CALCIUM PO Take 1 tablet by mouth daily., Until Discontinued, Historical Med    fluticasone (FLONASE) 50 MCG/ACT nasal spray 2 sprays by Each Nare route daily., Until Discontinued, Historical Med    gabapentin (NEURONTIN) 300 MG capsule Take 300 mg by mouth at bedtime., Until Discontinued, Historical Med    isosorbide mononitrate (IMDUR) 60 MG 24 hr tablet Take 60 mg by mouth daily. , Starting 09/22/2013, Until Discontinued, Historical Med    naproxen (NAPROSYN) 500 MG tablet Take 1 tablet by mouth 3 (three) times daily., Starting 02/14/2014, Until Discontinued, Historical Med    nitroGLYCERIN (NITROSTAT) 0.4 MG SL tablet Place 0.4 mg under the tongue every 5 (five) minutes as needed for chest pain. , Starting 02/15/2013, Until Discontinued, Historical Med    QVAR 80 MCG/ACT inhaler Inhale 2 puffs into the lungs daily., Starting 02/13/2014, Until Discontinued, Historical Med    traZODone (DESYREL) 100 MG tablet Take 100 mg by mouth at bedtime., Until Discontinued, Historical Med    cyclobenzaprine (FLEXERIL) 10 MG tablet Take 10 mg by mouth., Until Discontinued, Historical Med    DULoxetine (CYMBALTA) 30 MG capsule Starting 09/22/2013, Until Discontinued, Historical Med    Fluticasone-Salmeterol (ADVAIR) 500-50 MCG/DOSE AEPB Inhale 1 puff into the lungs every 12 (twelve) hours., Until Discontinued,  Historical Med      STOP taking these medications     Aspirin-Caffeine (BC FAST PAIN RELIEF) 845-65 MG PACK      azithromycin (ZITHROMAX) 250 MG tablet      predniSONE (DELTASONE) 20 MG tablet      predniSONE (STERAPRED UNI-PAK) 10 MG tablet        No Known Allergies    The results of significant diagnostics from this hospitalization (including imaging, microbiology, ancillary and laboratory) are listed below for reference.    Significant Diagnostic Studies: Dg Chest Portable 1 View  02/16/2014   CLINICAL DATA:  Mid chest pain radiating into right shoulder  EXAM: PORTABLE CHEST - 1 VIEW  COMPARISON:  06/15/2013  FINDINGS: Normal heart size. No pleural effusion or edema. The lungs are hyperinflated and there are coarsened interstitial markings identified bilaterally. Scarring is noted in both lower lobes.  IMPRESSION: 1. No acute cardiopulmonary abnormalities. 2. Bilateral lower lobe scarring.   Electronically Signed   By: Kerby Moors M.D.   On: 02/16/2014 15:06    Microbiology: No results found for this or any previous visit (from the past 240 hour(s)).   Labs: Basic Metabolic Panel:  Recent Labs Lab 02/16/14 1454 02/17/14 0554  NA 143 142  K 3.5*  4.0  CL 105 104  CO2 25 25  GLUCOSE 185* 91  BUN 10 13  CREATININE 0.63 0.64  CALCIUM 9.4 9.2   Liver Function Tests:  Recent Labs Lab 02/17/14 0554  AST 14  ALT 7  ALKPHOS 185*  BILITOT 0.5  PROT 6.5  ALBUMIN 3.5   No results for input(s): LIPASE, AMYLASE in the last 168 hours. No results for input(s): AMMONIA in the last 168 hours. CBC:  Recent Labs Lab 02/16/14 1454  WBC 5.9  NEUTROABS 2.9  HGB 14.0  HCT 40.3  MCV 84.5  PLT 224   Cardiac Enzymes:  Recent Labs Lab 02/16/14 1454 02/16/14 2320 02/17/14 0554  TROPONINI <0.30 <0.30 <0.30   BNP: BNP (last 3 results) No results for input(s): PROBNP in the last 8760 hours. CBG: No results for input(s): GLUCAP in the last 168  hours.     Signed:  Trace Wirick  [facility] 02/17/2014, 7:35 PM

## 2014-02-17 NOTE — Progress Notes (Signed)
  Echocardiogram 2D Echocardiogram has been performed.  Kennan, [place] 02/17/2014, 11:34 AM

## 2014-02-17 NOTE — Progress Notes (Signed)
Echo with normal LVEF, no wall motion abnormalities. History and testing at this time do not support her symptoms are cardiac, no plans for further cardiac testing or interventions at this time. Pain management per primary team, we will sign off of inpatient care. She may follow up with her primary cardiologist at [facility] a few weeks after discharge.    Zandra Abts MD

## 2014-03-31 ENCOUNTER — Emergency Department (HOSPITAL_COMMUNITY): Payer: Medicare Other

## 2014-03-31 ENCOUNTER — Encounter (HOSPITAL_COMMUNITY): Payer: Self-pay | Admitting: *Deleted

## 2014-03-31 ENCOUNTER — Emergency Department (HOSPITAL_COMMUNITY)
Admission: EM | Admit: 2014-03-31 | Discharge: 2014-03-31 | Disposition: A | Discharge status: Home / Self Care | Payer: Medicare Other | Attending: Emergency Medicine | Admitting: Emergency Medicine

## 2014-03-31 DIAGNOSIS — Z7951 Long term (current) use of inhaled steroids: Secondary | ICD-10-CM | POA: Insufficient documentation

## 2014-03-31 DIAGNOSIS — G8929 Other chronic pain: Secondary | ICD-10-CM | POA: Diagnosis not present

## 2014-03-31 DIAGNOSIS — R079 Chest pain, unspecified: Secondary | ICD-10-CM | POA: Diagnosis not present

## 2014-03-31 DIAGNOSIS — Z7982 Long term (current) use of aspirin: Secondary | ICD-10-CM | POA: Insufficient documentation

## 2014-03-31 DIAGNOSIS — I2510 Atherosclerotic heart disease of native coronary artery without angina pectoris: Secondary | ICD-10-CM | POA: Diagnosis not present

## 2014-03-31 DIAGNOSIS — Z720 Tobacco use: Secondary | ICD-10-CM | POA: Insufficient documentation

## 2014-03-31 DIAGNOSIS — R519 Headache, unspecified: Secondary | ICD-10-CM

## 2014-03-31 DIAGNOSIS — R42 Dizziness and giddiness: Secondary | ICD-10-CM | POA: Insufficient documentation

## 2014-03-31 DIAGNOSIS — I10 Essential (primary) hypertension: Secondary | ICD-10-CM | POA: Diagnosis not present

## 2014-03-31 DIAGNOSIS — R51 Headache: Secondary | ICD-10-CM | POA: Diagnosis present

## 2014-03-31 DIAGNOSIS — H53149 Visual discomfort, unspecified: Secondary | ICD-10-CM | POA: Insufficient documentation

## 2014-03-31 DIAGNOSIS — Z86011 Personal history of benign neoplasm of the brain: Secondary | ICD-10-CM | POA: Insufficient documentation

## 2014-03-31 DIAGNOSIS — Z79899 Other long term (current) drug therapy: Secondary | ICD-10-CM | POA: Diagnosis not present

## 2014-03-31 DIAGNOSIS — I252 Old myocardial infarction: Secondary | ICD-10-CM | POA: Insufficient documentation

## 2014-03-31 DIAGNOSIS — J45909 Unspecified asthma, uncomplicated: Secondary | ICD-10-CM | POA: Insufficient documentation

## 2014-03-31 HISTORY — DX: Atherosclerotic heart disease of native coronary artery without angina pectoris: I25.10

## 2014-03-31 LAB — CBC WITH DIFFERENTIAL/PLATELET
Basophils Absolute: 0 10*3/uL (ref 0.0–0.1)
Basophils Relative: 0 % (ref 0–1)
Eosinophils Absolute: 0.1 10*3/uL (ref 0.0–0.7)
Eosinophils Relative: 2 % (ref 0–5)
HCT: 42.9 % (ref 36.0–46.0)
Hemoglobin: 14.9 g/dL (ref 12.0–15.0)
LYMPHS ABS: 3.1 10*3/uL (ref 0.7–4.0)
Lymphocytes Relative: 52 % — ABNORMAL HIGH (ref 12–46)
MCH: 29.4 pg (ref 26.0–34.0)
MCHC: 34.7 g/dL (ref 30.0–36.0)
MCV: 84.6 fL (ref 78.0–100.0)
Monocytes Absolute: 0.3 10*3/uL (ref 0.1–1.0)
Monocytes Relative: 5 % (ref 3–12)
NEUTROS ABS: 2.4 10*3/uL (ref 1.7–7.7)
NEUTROS PCT: 41 % — AB (ref 43–77)
PLATELETS: 214 10*3/uL (ref 150–400)
RBC: 5.07 MIL/uL (ref 3.87–5.11)
RDW: 14.5 % (ref 11.5–15.5)
WBC: 5.8 10*3/uL (ref 4.0–10.5)

## 2014-03-31 LAB — URINALYSIS, ROUTINE W REFLEX MICROSCOPIC
BILIRUBIN URINE: NEGATIVE
Glucose, UA: NEGATIVE mg/dL
Ketones, ur: NEGATIVE mg/dL
Leukocytes, UA: NEGATIVE
Nitrite: NEGATIVE
PROTEIN: NEGATIVE mg/dL
Specific Gravity, Urine: 1.015 (ref 1.005–1.030)
UROBILINOGEN UA: 0.2 mg/dL (ref 0.0–1.0)
pH: 5.5 (ref 5.0–8.0)

## 2014-03-31 LAB — URINE MICROSCOPIC-ADD ON

## 2014-03-31 LAB — BASIC METABOLIC PANEL
Anion gap: 14 (ref 5–15)
BUN: 13 mg/dL (ref 6–23)
CO2: 24 mEq/L (ref 19–32)
Calcium: 9.2 mg/dL (ref 8.4–10.5)
Chloride: 107 mEq/L (ref 96–112)
Creatinine, Ser: 0.63 mg/dL (ref 0.50–1.10)
GFR calc non Af Amer: >90 mL/min (ref >90)
GLUCOSE: 83 mg/dL (ref 70–99)
POTASSIUM: 3.7 meq/L (ref 3.7–5.3)
SODIUM: 145 meq/L (ref 137–147)

## 2014-03-31 LAB — TROPONIN I

## 2014-03-31 LAB — PROTIME-INR
INR: 1.03 (ref 0.00–1.49)
PROTHROMBIN TIME: 13.6 s (ref 11.6–15.2)

## 2014-03-31 LAB — RAPID URINE DRUG SCREEN, HOSP PERFORMED
Amphetamines: NOT DETECTED
BARBITURATES: NOT DETECTED
Benzodiazepines: NOT DETECTED
Cocaine: NOT DETECTED
Opiates: NOT DETECTED
Tetrahydrocannabinol: NOT DETECTED

## 2014-03-31 LAB — SEDIMENTATION RATE: Sed Rate: 2 mm/hr (ref 0–22)

## 2014-03-31 MED ORDER — MORPHINE SULFATE 4 MG/ML IJ SOLN
INTRAMUSCULAR | Status: AC
  Filled 2014-03-31: qty 1

## 2014-03-31 MED ORDER — MORPHINE SULFATE 4 MG/ML IJ SOLN
4.0000 mg | Freq: Once | INTRAMUSCULAR | Status: AC
  Administered 2014-03-31: 4 mg via INTRAVENOUS

## 2014-03-31 NOTE — ED Notes (Signed)
Patient with no complaints at this time. Respirations even and unlabored. Skin warm/dry. Discharge instructions reviewed with patient at this time. Patient given opportunity to voice concerns/ask questions. IV removed per policy and band-aid applied to site. Patient discharged at this time and left Emergency Department with steady gait.  

## 2014-03-31 NOTE — ED Notes (Addendum)
Chest pain yesterday, took 2 NTG and resolved it.  HA started afterward and has progressively worsened, despite use of BC and Aleve. States she has had a "little more chest pain." Has prior dx of HTN, but was taken off antihypertensives 6 months ago.  Reports vertigo that started this morning upon waking. Denies any vision changes. States her MD is "watching some vessels in my heart.  I might have to have stents."  Denies n/v.   Hx of brain tumor treated in 2005.

## 2014-03-31 NOTE — Discharge Instructions (Signed)
Your labs today were normal including 2 sets of cardiac labs. An MRI of your brain showed no new stroke, mass or bleed. Please follow-up with your primary care physician for further evaluation.   Chest Pain (Nonspecific) It is often hard to give a specific diagnosis for the cause of chest pain. There is always a chance that your pain could be related to something serious, such as a heart attack or a blood clot in the lungs. You need to follow up with your health care provider for further evaluation. CAUSES   Heartburn.  Pneumonia or bronchitis.  Anxiety or stress.  Inflammation around your heart (pericarditis) or lung (pleuritis or pleurisy).  A blood clot in the lung.  A collapsed lung (pneumothorax). It can develop suddenly on its own (spontaneous pneumothorax) or from trauma to the chest.  Shingles infection (herpes zoster virus). The chest wall is composed of bones, muscles, and cartilage. Any of these can be the source of the pain.  The bones can be bruised by injury.  The muscles or cartilage can be strained by coughing or overwork.  The cartilage can be affected by inflammation and become sore (costochondritis). DIAGNOSIS  Lab tests or other studies may be needed to find the cause of your pain. Your health care provider may have you take a test called an ambulatory electrocardiogram (ECG). An ECG records your heartbeat patterns over a 24-hour period. You may also have other tests, such as:  Transthoracic echocardiogram (TTE). During echocardiography, sound waves are used to evaluate how blood flows through your heart.  Transesophageal echocardiogram (TEE).  Cardiac monitoring. This allows your health care provider to monitor your heart rate and rhythm in real time.  Holter monitor. This is a portable device that records your heartbeat and can help diagnose heart arrhythmias. It allows your health care provider to track your heart activity for several days, if  needed.  Stress tests by exercise or by giving medicine that makes the heart beat faster. TREATMENT   Treatment depends on what may be causing your chest pain. Treatment may include:  Acid blockers for heartburn.  Anti-inflammatory medicine.  Pain medicine for inflammatory conditions.  Antibiotics if an infection is present.  You may be advised to change lifestyle habits. This includes stopping smoking and avoiding alcohol, caffeine, and chocolate.  You may be advised to keep your head raised (elevated) when sleeping. This reduces the chance of acid going backward from your stomach into your esophagus. Most of the time, nonspecific chest pain will improve within 2-3 days with rest and mild pain medicine.  HOME CARE INSTRUCTIONS   If antibiotics were prescribed, take them as directed. Finish them even if you start to feel better.  For the next few days, avoid physical activities that bring on chest pain. Continue physical activities as directed.  Do not use any tobacco products, including cigarettes, chewing tobacco, or electronic cigarettes.  Avoid drinking alcohol.  Only take medicine as directed by your health care provider.  Follow your health care provider's suggestions for further testing if your chest pain does not go away.  Keep any follow-up appointments you made. If you do not go to an appointment, you could develop lasting (chronic) problems with pain. If there is any problem keeping an appointment, call to reschedule. SEEK MEDICAL CARE IF:   Your chest pain does not go away, even after treatment.  You have a rash with blisters on your chest.  You have a fever. [place]  IF:   You have increased chest pain or pain that spreads to your arm, neck, jaw, back, or abdomen.  You have shortness of breath.  You have an increasing cough, or you cough up blood.  You have severe back or abdominal pain.  You feel nauseous or vomit.  You have severe  weakness.  You faint.  You have chills. This is an emergency. Do not wait to see if the pain will go away. Get medical help at once. Call your local emergency services (911 in [place] Do not drive yourself to the hospital. MAKE SURE YOU:   Understand these instructions.  Will watch your condition.  Will get help right away if you are not doing well or get worse. Document Released: 01/08/2005 Document Revised: 04/05/2013 Document Reviewed: 11/04/2007 [facility] Patient Information 2015 [place], [place]. This information is not intended to replace advice given to you by your health care provider. Make sure you discuss any questions you have with your health care provider.  Dizziness Dizziness is a common problem. It is a feeling of unsteadiness or light-headedness. You may feel like you are about to faint. Dizziness can lead to injury if you stumble or fall. A person of any age group can suffer from dizziness, but dizziness is more common in older adults. CAUSES  Dizziness can be caused by many different things, including:  Middle ear problems.  Standing for too long.  Infections.  An allergic reaction.  Aging.  An emotional response to something, such as the sight of blood.  Side effects of medicines.  Tiredness.  Problems with circulation or blood pressure.  Excessive use of alcohol or medicines, or illegal drug use.  Breathing too fast (hyperventilation).  An irregular heart rhythm (arrhythmia).  A low red blood cell count (anemia).  Pregnancy.  Vomiting, diarrhea, fever, or other illnesses that cause body fluid loss (dehydration).  Diseases or conditions such as Parkinson's disease, high blood pressure (hypertension), diabetes, and thyroid problems.  Exposure to extreme heat. DIAGNOSIS  Your health care provider will ask about your symptoms, perform a physical exam, and perform an electrocardiogram (ECG) to record the electrical activity of your heart. Your health  care provider may also perform other heart or blood tests to determine the cause of your dizziness. These may include:  Transthoracic echocardiogram (TTE). During echocardiography, sound waves are used to evaluate how blood flows through your heart.  Transesophageal echocardiogram (TEE).  Cardiac monitoring. This allows your health care provider to monitor your heart rate and rhythm in real time.  Holter monitor. This is a portable device that records your heartbeat and can help diagnose heart arrhythmias. It allows your health care provider to track your heart activity for several days if needed.  Stress tests by exercise or by giving medicine that makes the heart beat faster. TREATMENT  Treatment of dizziness depends on the cause of your symptoms and can vary greatly. HOME CARE INSTRUCTIONS   Drink enough fluids to keep your urine clear or pale yellow. This is especially important in very hot weather. In older adults, it is also important in cold weather.  Take your medicine exactly as directed if your dizziness is caused by medicines. When taking blood pressure medicines, it is especially important to get up slowly.  Rise slowly from chairs and steady yourself until you feel okay.  In the morning, first sit up on the side of the bed. When you feel okay, stand slowly while holding onto something until you know your  balance is fine.  Move your legs often if you need to stand in one place for a long time. Tighten and relax your muscles in your legs while standing.  Have someone stay with you for 1-2 days if dizziness continues to be a problem. Do this until you feel you are well enough to stay alone. Have the person call your health care provider if he or she notices changes in you that are concerning.  Do not drive or use heavy machinery if you feel dizzy.  Do not drink alcohol. SEEK IMMEDIATE MEDICAL CARE IF:   Your dizziness or light-headedness gets worse.  You feel nauseous or  vomit.  You have problems talking, walking, or using your arms, hands, or legs.  You feel weak.  You are not thinking clearly or you have trouble forming sentences. It may take a friend or family member to notice this.  You have chest pain, abdominal pain, shortness of breath, or sweating.  Your vision changes.  You notice any bleeding.  You have side effects from medicine that seems to be getting worse rather than better. MAKE SURE YOU:   Understand these instructions.  Will watch your condition.  Will get help right away if you are not doing well or get worse. Document Released: 09/24/2000 Document Revised: 04/05/2013 Document Reviewed: 10/18/2010 [facility] Patient Information 2015 [place], [place]. This information is not intended to replace advice given to you by your health care provider. Make sure you discuss any questions you have with your health care provider.  General Headache Without Cause A headache is pain or discomfort felt around the head or neck area. The specific cause of a headache may not be found. There are many causes and types of headaches. A few common ones are:  Tension headaches.  Migraine headaches.  Cluster headaches.  Chronic daily headaches. HOME CARE INSTRUCTIONS   Keep all follow-up appointments with your caregiver or any specialist referral.  Only take over-the-counter or prescription medicines for pain or discomfort as directed by your caregiver.  Lie down in a dark, quiet room when you have a headache.  Keep a headache journal to find out what may trigger your migraine headaches. For example, write down:  What you eat and drink.  How much sleep you get.  Any change to your diet or medicines.  Try massage or other relaxation techniques.  Put ice packs or heat on the head and neck. Use these 3 to 4 times per day for 15 to 20 minutes each time, or as needed.  Limit stress.  Sit up straight, and do not tense your muscles.  Quit  smoking if you smoke.  Limit alcohol use.  Decrease the amount of caffeine you drink, or stop drinking caffeine.  Eat and sleep on a regular schedule.  Get 7 to 9 hours of sleep, or as recommended by your caregiver.  Keep lights dim if bright lights bother you and make your headaches worse. SEEK MEDICAL CARE IF:   You have problems with the medicines you were prescribed.  Your medicines are not working.  You have a change from the usual headache.  You have nausea or vomiting. SEEK IMMEDIATE MEDICAL CARE IF:   Your headache becomes severe.  You have a fever.  You have a stiff neck.  You have loss of vision.  You have muscular weakness or loss of muscle control.  You start losing your balance or have trouble walking.  You feel faint or pass out.  You have severe symptoms that are different from your first symptoms. MAKE SURE YOU:   Understand these instructions.  Will watch your condition.  Will get help right away if you are not doing well or get worse. Document Released: 03/31/2005 Document Revised: 06/23/2011 Document Reviewed: 04/16/2011 [facility] Patient Information 2015 [place], [place]. This information is not intended to replace advice given to you by your health care provider. Make sure you discuss any questions you have with your health care provider.

## 2014-03-31 NOTE — ED Provider Notes (Signed)
CSN: [id number]     Arrival date & time 03/31/14  1320 History  This chart was scribed for Ezequiel Essex, MD by Einar Pheasant, ED Scribe. This patient was seen in room [facility]18[facility]18 and the patient's care was started at 1:48 PM.    Chief Complaint  Patient presents with  . Headache  . Dizziness   The history is provided by the patient. No language interpreter was used.   HPI Comments: Courtney Zuniga is a 63 y.o. female with a PMhx of a benign brain tumor in 2005 presents to the Emergency Department complaining of gradual onset persistent dizziness that started this AM. Pt describes the current dizziness as room spinning sensation. She also complaining of associated H/A which have been gradually been getting worse.  She endorses associated photophobia. She reports taking BC and Aleve to alleviate her Headaches. Courtney Zuniga also endorses associated chest pain (lasting 5-7 minutes) which started 2 days prior to the onset of the Headaches. She took 2 nitroglycerin yesterday which made her headache worse She is not experiencing any further chest pain, which she states is similar to the chest pain she experienced some time ago for which she was hospitalized for. Pt states that she was taken her HTN medication because her prior providers told her that she did not need them. However, recently she cant seem to keep her BP down. Denies any blurry vision, double vision, recent falls, recent head injuries, taking blood thinners, illicit drug use, or prior hx of dizziness.    Past Medical History  Diagnosis Date  . Brain tumor (benign) 2005 [facility]    Benign  . Hypertension   . Asthma   . Chronic headaches   . Chronic hip pain   . Chronic pain   . NSTEMI (non-ST elevated myocardial infarction)   . Coronary artery disease    Past Surgical History  Procedure Laterality Date  . Cholecystectomy    . Tumor removal      Benign   Family History  Problem Relation Age of Onset  . Cancer Mother   .  Cancer Father   . Cancer Sister    History  Substance Use Topics  . Smoking status: Current Every Day Smoker -- 0.33 packs/day    Types: Cigarettes  . Smokeless tobacco: Not on file  . Alcohol Use: No   OB History    No data available     Review of Systems A complete 10 system review of systems was obtained and all systems are negative except as noted in the HPI and PMH.     Allergies  Review of patient's allergies indicates no known allergies.  Home Medications   Prior to Admission medications   Medication Sig Start Date End Date Taking? Authorizing Provider  albuterol (PROVENTIL HFA;VENTOLIN HFA) 108 (90 BASE) MCG/ACT inhaler Inhale 2 puffs into the lungs every 6 (six) hours as needed. Shortness of Breath   Yes Historical Provider, MD  albuterol (PROVENTIL) (2.5 MG/3ML) 0.083% nebulizer solution Take 2.5 mg by nebulization every 6 (six) hours as needed for wheezing or shortness of breath.   Yes Historical Provider, MD  amitriptyline (ELAVIL) 75 MG tablet Take 75 mg by mouth at bedtime.    Yes Historical Provider, MD  aspirin EC 81 MG tablet Take 81 mg by mouth daily.   Yes Historical Provider, MD  calcium-vitamin D 250-100 MG-UNIT per tablet Take 1 tablet by mouth daily.   Yes Historical Provider, MD  DULoxetine (CYMBALTA) 30 MG capsule Take  30 mg by mouth 2 (two) times daily.  09/22/13  Yes Historical Provider, MD  fluticasone (FLONASE) 50 MCG/ACT nasal spray 2 sprays by Each Nare route daily.   Yes Historical Provider, MD  Fluticasone-Salmeterol (ADVAIR) 500-50 MCG/DOSE AEPB Inhale 1 puff into the lungs every 12 (twelve) hours.   Yes Historical Provider, MD  gabapentin (NEURONTIN) 300 MG capsule Take 300 mg by mouth 3 (three) times daily.    Yes Historical Provider, MD  isosorbide mononitrate (IMDUR) 60 MG 24 hr tablet Take 60 mg by mouth daily.  09/22/13  Yes Historical Provider, MD  naproxen (NAPROSYN) 500 MG tablet Take 1 tablet by mouth 2 (two) times daily as needed for  moderate pain.  02/14/14  Yes Historical Provider, MD  nitroGLYCERIN (NITROSTAT) 0.4 MG SL tablet Place 0.4 mg under the tongue every 5 (five) minutes as needed for chest pain.  02/15/13  Yes Historical Provider, MD  pantoprazole (PROTONIX) 40 MG tablet Take 1 tablet (40 mg total) by mouth daily. 02/17/14  Yes Kathie Dike, MD  QVAR 80 MCG/ACT inhaler Inhale 1 puff into the lungs 2 (two) times daily.  02/13/14  Yes Historical Provider, MD  acetaminophen-codeine (TYLENOL #3) 300-30 MG per tablet Take 1 tablet by mouth every 4 (four) hours as needed for moderate pain. Patient not taking: Reported on 03/31/2014 12/01/13   Carole Civil, MD  alendronate (FOSAMAX) 70 MG tablet Take 70 mg by mouth every 7 (seven) days. Take with a full glass of water on an empty stomach.    Historical Provider, MD  traZODone (DESYREL) 100 MG tablet Take 100 mg by mouth at bedtime.    Historical Provider, MD   BP 163/104 mmHg  Pulse 69  Temp(Src) 98.1 F (36.7 C) (Oral)  Resp 19  Ht 5\' 6"  (1.676 m)  Wt 156 lb (70.761 kg)  BMI 25.19 kg/m2  SpO2 97%   Physical Exam  Constitutional: She is oriented to person, place, and time. She appears well-developed and well-nourished. No distress.  Appears uncomfortable.  HENT:  Head: Normocephalic and atraumatic.  Mouth/Throat: Oropharynx is clear and moist. No oropharyngeal exudate.  Pt has bilateral temporal soreness. Without temporal artery tenderness  Eyes: Conjunctivae and EOM are normal. Pupils are equal, round, and reactive to light.  Neck: Normal range of motion. Neck supple.  No meningismus.  Cardiovascular: Normal rate, regular rhythm, normal heart sounds and intact distal pulses.   No murmur heard. Pulmonary/Chest: Effort normal and breath sounds normal. No respiratory distress.  Abdominal: Soft. There is no tenderness. There is no rebound and no guarding.  Musculoskeletal: Normal range of motion. She exhibits no edema or tenderness.  Neurological: She is  alert and oriented to person, place, and time. No cranial nerve deficit. She exhibits normal muscle tone. Coordination normal.  No ataxia on finger to nose bilaterally. No pronator drift. 5/5 strength throughout. CN 2-12 intact. Positive Romberg. Equal grip strength. Sensation intact. Gait was steady.  Skin: Skin is warm.  Psychiatric: She has a normal mood and affect. Her behavior is normal.  Nursing note and vitals reviewed.   ED Course  Procedures (including critical care time)  DIAGNOSTIC STUDIES: Oxygen Saturation is 97% on RA, adequate by my interpretation.    COORDINATION OF CARE: 1:57 PM- Pt advised of plan for treatment and pt agrees.  Labs Review Labs Reviewed  CBC WITH DIFFERENTIAL - Abnormal; Notable for the following:    Neutrophils Relative % 41 (*)    Lymphocytes Relative 52 (*)  All other components within normal limits  URINALYSIS, ROUTINE W REFLEX MICROSCOPIC - Abnormal; Notable for the following:    Hgb urine dipstick TRACE (*)    All other components within normal limits  BASIC METABOLIC PANEL  TROPONIN I  PROTIME-INR  URINE RAPID DRUG SCREEN (HOSP PERFORMED)  SEDIMENTATION RATE  URINE MICROSCOPIC-ADD ON    Imaging Review Dg Chest 2 View  03/31/2014   CLINICAL DATA:  Chest pain with headaches and dizziness today. Smoker.  EXAM: CHEST  2 VIEW  COMPARISON:  Radiographs 02/16/2014 and 06/15/2013.  FINDINGS: The heart size and mediastinal contours are stable. There is chronic obstructive pulmonary disease with resolution of previously demonstrated right middle lobe opacity. There is no confluent airspace opacity, edema or pleural effusion. No acute osseous findings are evident. Multiple telemetry leads overlie the chest.  IMPRESSION: Emphysema/chronic obstructive pulmonary disease. No acute cardiopulmonary process.   Electronically Signed   By: Camie Patience M.D.   On: 03/31/2014 14:34     EKG Interpretation None      MDM   Final diagnoses:  Headache   Dizziness   Gradual onset frontal headache since yesterday progressively worsening. Denies any fever, chills, nausea, vomiting, vision changes. Endorses photophobia and phonophobia. Episode of chest pain yesterday that resolved after 5 minutes and 2 nitroglycerin. Similar to previous. No further chest pain.  Positive Romberg on exam. Check CT. CT head negative with out evidence of hemorrhage. Doubt SAH, meningitis, temporal arteritis.  No visual changes.  Headache has resolved after morphine.  Patient has had no chest pain since yesterday. She states she had an episode about 5 minutes that improved after 2 nitroglycerin. She had an admission for chest pain about 6 weeks ago and was seen by cardiology. She's had a negative stress test within the past year. Her EKG is unchanged. Troponin negative here.  She feels improved and her neurological exam is nonfocal.  MRI will be obtained given her ataxia and vertigo.   Second troponin pending.  Patient has had no further chest pain since yesterday, EKG unchanged, Recent evaluation by cardiology.  BP has improved.  MRI and second troponin pending at time of sign our to Dr. Leonides Schanz.    Date: 03/31/2014  Rate: 66  Rhythm: normal sinus rhythm  QRS Axis: normal  Intervals: normal  ST/T Wave abnormalities: normal  Conduction Disutrbances:none  Narrative Interpretation: septal Q waves  Old EKG Reviewed: unchanged   I personally performed the services described in this documentation, which was scribed in my presence. The recorded information has been reviewed and is accurate.    Ezequiel Essex, MD 04/01/14 1004

## 2014-03-31 NOTE — ED Provider Notes (Signed)
5:15 PM  Assumed care from Dr. Wyvonnia Dusky.  Pt is a 63 y.o. female who had a gradual onset headache yesterday that is now severe with dizziness and ataxia. She had a negative head CT. She had a brain tumor in 2005 which was resected. MRI brain pending. She also had an episode of 5 minutes of chest pain yesterday that was resolved after 2 nitroglycerin. She has had a negative stress test in the past year and a recent admission for chest pain rule out. First troponin negative. Second troponin pending.  If workup is unremarkable, plan is for discharge home.   5:30 PM  Pt's head CT is read as progression of mastoid air cell disease on the right compared to prior study. There is also volume loss with mild periventricular small disease but no intracranial mass, hemorrhage, fluid collection or acute infarct. MRI of her brain shows no acute intracranial abnormality. 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 She has progression of nonspecific white matter changes since 2008 most commonly due to chronic small vessel disease. She has a chronic right mastoid effusion and a new very mild left mastoid effusion since since 2008.  6:05 PM  Pt's second troponin is negative. She has no chest pain currently. Has been able to ambulate without difficulty. Headache is now gone. Family at bedside. Discussed return cautions with patient. Will discharge home. She verbalized understanding and is comfortable with plan.  [place], DO 03/31/14 1805

## 2014-05-03 ENCOUNTER — Encounter (HOSPITAL_COMMUNITY): Payer: Self-pay | Admitting: *Deleted

## 2014-05-03 ENCOUNTER — Emergency Department (HOSPITAL_COMMUNITY): Payer: Medicare Other

## 2014-05-03 ENCOUNTER — Emergency Department (HOSPITAL_COMMUNITY)
Admission: EM | Admit: 2014-05-03 | Discharge: 2014-05-03 | Disposition: A | Discharge status: Home / Self Care | Payer: Medicare Other | Attending: Emergency Medicine | Admitting: Emergency Medicine

## 2014-05-03 DIAGNOSIS — Z86011 Personal history of benign neoplasm of the brain: Secondary | ICD-10-CM | POA: Diagnosis not present

## 2014-05-03 DIAGNOSIS — Z791 Long term (current) use of non-steroidal anti-inflammatories (NSAID): Secondary | ICD-10-CM | POA: Insufficient documentation

## 2014-05-03 DIAGNOSIS — Z79899 Other long term (current) drug therapy: Secondary | ICD-10-CM | POA: Insufficient documentation

## 2014-05-03 DIAGNOSIS — M79673 Pain in unspecified foot: Secondary | ICD-10-CM

## 2014-05-03 DIAGNOSIS — G8929 Other chronic pain: Secondary | ICD-10-CM | POA: Insufficient documentation

## 2014-05-03 DIAGNOSIS — Z7982 Long term (current) use of aspirin: Secondary | ICD-10-CM | POA: Insufficient documentation

## 2014-05-03 DIAGNOSIS — Z7951 Long term (current) use of inhaled steroids: Secondary | ICD-10-CM | POA: Diagnosis not present

## 2014-05-03 DIAGNOSIS — I2510 Atherosclerotic heart disease of native coronary artery without angina pectoris: Secondary | ICD-10-CM | POA: Insufficient documentation

## 2014-05-03 DIAGNOSIS — I10 Essential (primary) hypertension: Secondary | ICD-10-CM | POA: Diagnosis not present

## 2014-05-03 DIAGNOSIS — J45909 Unspecified asthma, uncomplicated: Secondary | ICD-10-CM | POA: Insufficient documentation

## 2014-05-03 DIAGNOSIS — I252 Old myocardial infarction: Secondary | ICD-10-CM | POA: Diagnosis not present

## 2014-05-03 DIAGNOSIS — Z7983 Long term (current) use of bisphosphonates: Secondary | ICD-10-CM | POA: Insufficient documentation

## 2014-05-03 DIAGNOSIS — Z720 Tobacco use: Secondary | ICD-10-CM | POA: Insufficient documentation

## 2014-05-03 DIAGNOSIS — M7662 Achilles tendinitis, left leg: Secondary | ICD-10-CM | POA: Insufficient documentation

## 2014-05-03 DIAGNOSIS — M79672 Pain in left foot: Secondary | ICD-10-CM | POA: Diagnosis present

## 2014-05-03 MED ORDER — HYDROCODONE-ACETAMINOPHEN 5-325 MG PO TABS
1.0000 | ORAL_TABLET | Freq: Once | ORAL | Status: AC
  Administered 2014-05-03: 1 via ORAL
  Filled 2014-05-03: qty 1

## 2014-05-03 MED ORDER — NAPROXEN 500 MG PO TABS: 500.0000 mg | ORAL_TABLET | Freq: Two times a day (BID) | ORAL | Status: DC

## 2014-05-03 MED ORDER — METHYLPREDNISOLONE 4 MG PO KIT: PACK | ORAL | Status: DC

## 2014-05-03 MED ORDER — HYDROCODONE-ACETAMINOPHEN 5-325 MG PO TABS: 2.0000 | ORAL_TABLET | ORAL | Status: DC | PRN

## 2014-05-03 MED ORDER — IBUPROFEN 800 MG PO TABS
800.0000 mg | ORAL_TABLET | Freq: Once | ORAL | Status: AC
  Administered 2014-05-03: 800 mg via ORAL
  Filled 2014-05-03: qty 1

## 2014-05-03 NOTE — ED Provider Notes (Signed)
CSN: [id number]     Arrival date & time 05/03/14  1517 History   First MD Initiated Contact with Patient 05/03/14 1538     Chief Complaint  Patient presents with  . Foot Pain    HPI  Patient presents for heel pain. States that the last 2 days of pain in the back of her left heel. No injury. She noticed it when she got up and around yesterday. Is worse in the point that she was walking with a crutch. Very point tender on the left heel. Has had "arthritis" in the past. However states it is only bother her back. No history of rheumatoid. No history of polyarthritis or gout. No new medications. Does not take thiazide. Past Medical History  Diagnosis Date  . Brain tumor (benign) 2005 [facility]    Benign  . Hypertension   . Asthma   . Chronic headaches   . Chronic hip pain   . Chronic pain   . NSTEMI (non-ST elevated myocardial infarction)   . Coronary artery disease    Past Surgical History  Procedure Laterality Date  . Cholecystectomy    . Tumor removal      Benign   Family History  Problem Relation Age of Onset  . Cancer Mother   . Cancer Father   . Cancer Sister    History  Substance Use Topics  . Smoking status: Current Every Day Smoker -- 0.33 packs/day    Types: Cigarettes  . Smokeless tobacco: Not on file  . Alcohol Use: No   OB History    No data available     Review of Systems  Constitutional: Negative for fever, chills, diaphoresis, appetite change and fatigue.  HENT: Negative for mouth sores, sore throat and trouble swallowing.   Eyes: Negative for visual disturbance.  Respiratory: Negative for cough, chest tightness, shortness of breath and wheezing.   Cardiovascular: Negative for chest pain.  Gastrointestinal: Negative for nausea, vomiting, abdominal pain, diarrhea and abdominal distention.  Endocrine: Negative for polydipsia, polyphagia and polyuria.  Genitourinary: Negative for dysuria, frequency and hematuria.  Musculoskeletal: Positive for gait problem.        Left heel pain  Skin: Negative for color change, pallor and rash.  Neurological: Negative for dizziness, syncope, light-headedness and headaches.  Hematological: Does not bruise/bleed easily.  Psychiatric/Behavioral: Negative for behavioral problems and confusion.      Allergies  Review of patient's allergies indicates no known allergies.  Home Medications   Prior to Admission medications   Medication Sig Start Date End Date Taking? Authorizing Provider  albuterol (PROVENTIL HFA;VENTOLIN HFA) 108 (90 BASE) MCG/ACT inhaler Inhale 2 puffs into the lungs every 6 (six) hours as needed. Shortness of Breath   Yes Historical Provider, MD  albuterol (PROVENTIL) (2.5 MG/3ML) 0.083% nebulizer solution Take 2.5 mg by nebulization every 6 (six) hours as needed for wheezing or shortness of breath.   Yes Historical Provider, MD  alendronate (FOSAMAX) 70 MG tablet Take 70 mg by mouth every 7 (seven) days. Take with a full glass of water on an empty stomach.   Yes Historical Provider, MD  aspirin EC 81 MG tablet Take 81 mg by mouth daily.   Yes Historical Provider, MD  calcium-vitamin D 250-100 MG-UNIT per tablet Take 1 tablet by mouth daily.   Yes Historical Provider, MD  DULoxetine (CYMBALTA) 30 MG capsule Take 30 mg by mouth 2 (two) times daily.  09/22/13  Yes Historical Provider, MD  Fluticasone-Salmeterol (ADVAIR) 500-50 MCG/DOSE AEPB Inhale  1 puff into the lungs every 12 (twelve) hours.   Yes Historical Provider, MD  gabapentin (NEURONTIN) 300 MG capsule Take 300 mg by mouth 3 (three) times daily.    Yes Historical Provider, MD  isosorbide mononitrate (IMDUR) 60 MG 24 hr tablet Take 60 mg by mouth daily.  09/22/13  Yes Historical Provider, MD  nitroGLYCERIN (NITROSTAT) 0.4 MG SL tablet Place 0.4 mg under the tongue every 5 (five) minutes as needed for chest pain.  02/15/13  Yes Historical Provider, MD  OXYGEN Inhale 2 L into the lungs at bedtime.   Yes Historical Provider, MD  QVAR 80 MCG/ACT  inhaler Inhale 1 puff into the lungs 2 (two) times daily.  02/13/14  Yes Historical Provider, MD  HYDROcodone-acetaminophen (NORCO/VICODIN) 5-325 MG per tablet Take 2 tablets by mouth every 4 (four) hours as needed. 05/03/14   Tanna Furry, MD  methylPREDNISolone (MEDROL DOSEPAK) 4 MG tablet 6 po on day 1, decrease by 1 tab per day 05/03/14   Tanna Furry, MD  naproxen (NAPROSYN) 500 MG tablet Take 1 tablet (500 mg total) by mouth 2 (two) times daily. 05/03/14   Tanna Furry, MD  pantoprazole (PROTONIX) 40 MG tablet Take 1 tablet (40 mg total) by mouth daily. Patient not taking: Reported on 05/03/2014 02/17/14   Kathie Dike, MD   BP 118/81 mmHg  Pulse 97  Temp(Src) 98.7 F (37.1 C) (Oral)  Resp 24  Ht 5\' 7"  (1.702 m)  Wt 154 lb (69.854 kg)  BMI 24.11 kg/m2  SpO2 98% Physical Exam  Constitutional: She is oriented to person, place, and time. She appears well-developed and well-nourished. No distress.  HENT:  Head: Normocephalic.  Eyes: Conjunctivae are normal. Pupils are equal, round, and reactive to light. No scleral icterus.  Neck: Normal range of motion. Neck supple. No thyromegaly present.  Cardiovascular: Normal rate and regular rhythm.  Exam reveals no gallop and no friction rub.   No murmur heard. Pulmonary/Chest: Effort normal and breath sounds normal. No respiratory distress. She has no wheezes. She has no rales.  Abdominal: Soft. Bowel sounds are normal. She exhibits no distension. There is no tenderness. There is no rebound.  Musculoskeletal: Normal range of motion.       Feet:  Patient with tenderness and some soft tissue swelling immediately adjacent medial and lateral to the Achilles tendon. She is able to dorsiflex and plantar flex. Her Achilles tendon and muscle unit is intact.  Neurological: She is alert and oriented to person, place, and time.  Skin: Skin is warm and dry. No rash noted.  Psychiatric: She has a normal mood and affect. Her behavior is normal.    ED Course    Procedures (including critical care time) Labs Review Labs Reviewed - No data to display  Imaging Review Dg Ankle Complete Left  05/03/2014   CLINICAL DATA:  Left heel pain and swelling back of the heel  EXAM: LEFT ANKLE COMPLETE - 3+ VIEW  COMPARISON:  None.  FINDINGS: Three views of the left ankle submitted. No acute fracture or subluxation. Minimal posterior spurring noted posterior calcaneus at Achilles tendon insertion. Clinical correlation is necessary to exclude tendonitis.  IMPRESSION: No acute fracture or subluxation. Ankle mortise is preserved. Minimal calcaneal posterior spurring at Achilles tendon insertion. Clinical correlation is necessary to exclude Achilles tendon tendinitis.   Electronically Signed   By: Lahoma Crocker M.D.   On: 05/03/2014 16:24     EKG Interpretation None      MDM  Final diagnoses:  Heel pain  Achilles bursitis, left  Achilles tendinitis of left lower extremity    Exam shows tenderness somewhat over the Achilles tendon. However, mostly medial and lateral adjacent to the Achilles. This is very likely a Achilles bursitis. X-ray does show a small amount of spurring at the Achilles and a Haglund's deformity.. Does not appear saline. No redness or expanding cellulitis. Plan his steroids, anti-inflammatories, pain medication. Ice and minimizing weightbearing. The follow-up if not improving.    Tanna Furry, MD 05/03/14 [phone]

## 2014-05-03 NOTE — ED Notes (Signed)
Pt states pain and swelling to back of left heel, stating that it is difficult for her to work.

## 2014-05-03 NOTE — Discharge Instructions (Signed)
Avoid walking until your symptoms are improving. Courtney Zuniga your crutches and avoid weightbearing on your left leg when you must be up and around. Apply ice to the heel for 20 minutes at a time 3 times a day. Follow-up with her primary care physician, or Dr. Aline Brochure with orthopedics if not improving within the next 5-7 days.  Achilles Tendinitis Achilles tendinitis is inflammation of the tough, cord-like band that attaches the lower muscles of your leg to your heel (Achilles tendon). It is usually caused by overusing the tendon and joint involved.  CAUSES Achilles tendinitis can happen because of:  A sudden increase in exercise or activity (such as running).  Doing the same exercises or activities (such as jumping) over and over.  Not warming up calf muscles before exercising.  Exercising in shoes that are worn out or not made for exercise.  Having arthritis or a bone growth on the back of the heel bone. This can rub against the tendon and hurt the tendon. SIGNS AND SYMPTOMS The most common symptoms are:  Pain in the back of the leg, just above the heel. The pain usually gets worse with exercise and better with rest.  Stiffness or soreness in the back of the leg, especially in the morning.  Swelling of the skin over the Achilles tendon.  Trouble standing on tiptoe. Sometimes, an Achilles tendon tears (ruptures). Symptoms of an Achilles tendon rupture can include:  Sudden, severe pain in the back of the leg.  Trouble putting weight on the foot or walking normally. DIAGNOSIS Achilles tendinitis will be diagnosed based on symptoms and a physical examination. An X-ray may be done to check if another condition is causing your symptoms. An MRI may be ordered if your health care provider suspects you may have completely torn your tendon, which is called an Achilles tendon rupture.  TREATMENT  Achilles tendinitis usually gets better over time. It can take weeks to months to heal completely.  Treatment focuses on treating the symptoms and helping the injury heal. HOME CARE INSTRUCTIONS   Rest your Achilles tendon and avoid activities that cause pain.  Apply ice to the injured area:  Put ice in a plastic bag.  Place a towel between your skin and the bag.  Leave the ice on for 20 minutes, 2-3 times a day  Try to avoid using the tendon (other than gentle range of motion) while the tendon is painful. Do not resume use until instructed by your health care provider. Then begin use gradually. Do not increase use to the point of pain. If pain does develop, decrease use and continue the above measures. Gradually increase activities that do not cause discomfort until you achieve normal use.  Do exercises to make your calf muscles stronger and more flexible. Your health care provider or physical therapist can recommend exercises for you to do.  Wrap your ankle with an elastic bandage or other wrap. This can help keep your tendon from moving too much. Your health care provider will show you how to wrap your ankle correctly.  Only take over-the-counter or prescription medicines for pain, discomfort, or fever as directed by your health care provider. SEEK MEDICAL CARE IF:   Your pain and swelling increase or pain is uncontrolled with medicines.  You develop new, unexplained symptoms or your symptoms get worse.  You are unable to move your toes or foot.  You develop warmth and swelling in your foot.  You have an unexplained temperature. MAKE SURE YOU:  Understand these instructions.  Will watch your condition.  Will get help right away if you are not doing well or get worse. Document Released: 01/08/2005 Document Revised: 01/19/2013 Document Reviewed: 11/10/2012 [facility] Patient Information 2015 [place], [place]. This information is not intended to replace advice given to you by your health care provider. Make sure you discuss any questions you have with your health care  provider.  Bursitis Bursitis is when the fluid-filled sac (bursa) that covers and protects a joint gets puffy and irritated. The elbow, shoulder, hip, and knee joints are most often affected. HOME CARE  Put ice on the area.  Put ice in a plastic bag.  Place a towel between your skin and the bag.  Leave the ice on for 15-20 minutes, 03-04 times a day.  Put the joint through a full range of motion 4 times a day. Rest the injured joint at other times. When you have less pain, begin slow movements and usual activities.  Only take medicine as told by your doctor.  Follow up with your doctor. Any delay in care could stop the bursitis from healing. This could cause long-term pain. GET HELP RIGHT AWAY IF:   You have more pain with treatment.  You have a temperature by mouth above 102 F (38.9 C), not controlled by medicine.  You have heat and irritation over the fluid-filled sac. MAKE SURE YOU:   Understand these instructions.  Will watch your condition.  Will get help right away if you are not doing well or get worse. Document Released: 09/18/2009 Document Revised: 06/23/2011 Document Reviewed: 06/20/2013 [facility] Patient Information 2015 [place], [place]. This information is not intended to replace advice given to you by your health care provider. Make sure you discuss any questions you have with your health care provider.  Bursitis Bursitis is when the fluid-filled sac (bursa) that covers and protects a joint gets puffy and irritated. The elbow, shoulder, hip, and knee joints are most often affected. HOME CARE  Put ice on the area.  Put ice in a plastic bag.  Place a towel between your skin and the bag.  Leave the ice on for 15-20 minutes, 03-04 times a day.  Put the joint through a full range of motion 4 times a day. Rest the injured joint at other times. When you have less pain, begin slow movements and usual activities.  Only take medicine as told by your  doctor.  Follow up with your doctor. Any delay in care could stop the bursitis from healing. This could cause long-term pain. GET HELP RIGHT AWAY IF:   You have more pain with treatment.  You have a temperature by mouth above 102 F (38.9 C), not controlled by medicine.  You have heat and irritation over the fluid-filled sac. MAKE SURE YOU:   Understand these instructions.  Will watch your condition.  Will get help right away if you are not doing well or get worse. Document Released: 09/18/2009 Document Revised: 06/23/2011 Document Reviewed: 06/20/2013 [facility] Patient Information 2015 [place], [place]. This information is not intended to replace advice given to you by your health care provider. Make sure you discuss any questions you have with your health care provider.

## 2014-05-23 ENCOUNTER — Ambulatory Visit (INDEPENDENT_AMBULATORY_CARE_PROVIDER_SITE_OTHER): Payer: Medicare Other | Admitting: Orthopedic Surgery

## 2014-05-23 ENCOUNTER — Encounter: Payer: Self-pay | Admitting: Orthopedic Surgery

## 2014-05-23 VITALS — BP 156/82 | Ht 67.0 in | Wt 154.0 lb

## 2014-05-23 DIAGNOSIS — M7662 Achilles tendinitis, left leg: Secondary | ICD-10-CM

## 2014-05-23 MED ORDER — HYDROCODONE-ACETAMINOPHEN 5-325 MG PO TABS: 1.0000 | ORAL_TABLET | Freq: Four times a day (QID) | ORAL | Status: DC | PRN

## 2014-05-23 NOTE — Progress Notes (Signed)
Patient ID: Courtney Zuniga, female   DOB: 05/25/1950, 64 y.o.   MRN: [id number]  Chief Complaint  Patient presents with  . Follow-up    er follow up left foot, achilles tendonitis    HPI Courtney Zuniga is a 64 y.o. female.  Presents with acute onset of pain in her left heel which required a visit to the emergency room. She was treated with hydrocodone she is already on naproxen. She denies any trauma she has pain which is moderate to severe aching over the posterior aspect of the left heel near the Achilles without plantar fascial symptoms. HPI  Review of Systems Review of Systems  Constitutional: Negative for fever.  Musculoskeletal: Positive for myalgias and back pain.  Neurological: Negative.      Past Medical History  Diagnosis Date  . Brain tumor (benign) 2005 [facility]    Benign  . Hypertension   . Asthma   . Chronic headaches   . Chronic hip pain   . Chronic pain   . NSTEMI (non-ST elevated myocardial infarction)   . Coronary artery disease     Past Surgical History  Procedure Laterality Date  . Cholecystectomy    . Tumor removal      Benign    Family History  Problem Relation Age of Onset  . Cancer Mother   . Cancer Father   . Cancer Sister     Social History History  Substance Use Topics  . Smoking status: Current Every Day Smoker -- 0.33 packs/day    Types: Cigarettes  . Smokeless tobacco: Not on file  . Alcohol Use: No    No Known Allergies  Current Outpatient Prescriptions  Medication Sig Dispense Refill  . albuterol (PROVENTIL HFA;VENTOLIN HFA) 108 (90 BASE) MCG/ACT inhaler Inhale 2 puffs into the lungs every 6 (six) hours as needed. Shortness of Breath    . albuterol (PROVENTIL) (2.5 MG/3ML) 0.083% nebulizer solution Take 2.5 mg by nebulization every 6 (six) hours as needed for wheezing or shortness of breath.    Marland Kitchen alendronate (FOSAMAX) 70 MG tablet Take 70 mg by mouth every 7 (seven) days. Take with a full glass of water on an empty stomach.     Marland Kitchen aspirin EC 81 MG tablet Take 81 mg by mouth daily.    . calcium-vitamin D 250-100 MG-UNIT per tablet Take 1 tablet by mouth daily.    . DULoxetine (CYMBALTA) 30 MG capsule Take 30 mg by mouth 2 (two) times daily.     . Fluticasone-Salmeterol (ADVAIR) 500-50 MCG/DOSE AEPB Inhale 1 puff into the lungs every 12 (twelve) hours.    . gabapentin (NEURONTIN) 300 MG capsule Take 300 mg by mouth 3 (three) times daily.     Marland Kitchen HYDROcodone-acetaminophen (NORCO/VICODIN) 5-325 MG per tablet Take 2 tablets by mouth every 4 (four) hours as needed. 10 tablet 0  . isosorbide mononitrate (IMDUR) 60 MG 24 hr tablet Take 60 mg by mouth daily.     . naproxen (NAPROSYN) 500 MG tablet Take 1 tablet (500 mg total) by mouth 2 (two) times daily. 30 tablet 0  . nitroGLYCERIN (NITROSTAT) 0.4 MG SL tablet Place 0.4 mg under the tongue every 5 (five) minutes as needed for chest pain.     . OXYGEN Inhale 2 L into the lungs at bedtime.    Marland Kitchen QVAR 80 MCG/ACT inhaler Inhale 1 puff into the lungs 2 (two) times daily.     Marland Kitchen HYDROcodone-acetaminophen (NORCO/VICODIN) 5-325 MG per tablet Take 1 tablet by mouth every  6 (six) hours as needed for moderate pain. 120 tablet 0  . methylPREDNISolone (MEDROL DOSEPAK) 4 MG tablet 6 po on day 1, decrease by 1 tab per day (Patient not taking: Reported on 05/23/2014) 21 tablet 0  . pantoprazole (PROTONIX) 40 MG tablet Take 1 tablet (40 mg total) by mouth daily. (Patient not taking: Reported on 05/03/2014) 30 tablet 1   No current facility-administered medications for this visit.       Physical Exam Blood pressure 156/82, height 5\' 7"  (1.702 m), weight 154 lb (69.854 kg). Physical Exam BP 156/82 mmHg  Ht 5\' 7"  (1.702 m)  Wt 154 lb (69.854 kg)  BMI 24.11 kg/m2 Her appearance is she is an ectomorphic body habitus she is oriented 3 her mood is pleasant she is ambulatory in a pair of boots without a limp she does have tenderness in her Achilles tendon painful dorsiflexion and stretching  sensation no swelling in the bursa ankle joint stable plantar flexion strength normal Thompson test negative for tear skin is intact without rash pulses good sensation is normal   Data Reviewed Plain films show no acute injury in dependent interpretation hospital film taken EXAM: LEFT ANKLE COMPLETE - 3+ VIEW   COMPARISON:  None.   FINDINGS: Three views of the left ankle submitted. No acute fracture or subluxation. Minimal posterior spurring noted posterior calcaneus at Achilles tendon insertion. Clinical correlation is necessary to exclude tendonitis.   IMPRESSION: No acute fracture or subluxation. Ankle mortise is preserved. Minimal calcaneal posterior spurring at Achilles tendon insertion. Clinical correlation is necessary to exclude Achilles tendon tendinitis.     Electronically Signed   By: Lahoma Crocker M.D.   On: 05/03/2014 16:24    Assessment    Encounter Diagnosis  Name Primary?  . Achilles tendinitis of left lower extremity Yes        Plan    Cam Walker continue naproxen hydrocodone 5 mg 6 weeks in follow-up

## 2014-06-25 ENCOUNTER — Encounter (HOSPITAL_COMMUNITY): Payer: Self-pay | Admitting: Emergency Medicine

## 2014-06-25 ENCOUNTER — Inpatient Hospital Stay (HOSPITAL_COMMUNITY)
Admission: EM | Admit: 2014-06-25 | Discharge: 2014-06-29 | DRG: 189 | Disposition: A | Discharge status: Home / Self Care | Payer: Medicare Other | Attending: Internal Medicine | Admitting: Internal Medicine

## 2014-06-25 ENCOUNTER — Emergency Department (HOSPITAL_COMMUNITY): Payer: Medicare Other

## 2014-06-25 DIAGNOSIS — E860 Dehydration: Secondary | ICD-10-CM | POA: Diagnosis present

## 2014-06-25 DIAGNOSIS — J440 Chronic obstructive pulmonary disease with acute lower respiratory infection: Secondary | ICD-10-CM | POA: Diagnosis present

## 2014-06-25 DIAGNOSIS — R0602 Shortness of breath: Secondary | ICD-10-CM | POA: Diagnosis not present

## 2014-06-25 DIAGNOSIS — I10 Essential (primary) hypertension: Secondary | ICD-10-CM | POA: Diagnosis present

## 2014-06-25 DIAGNOSIS — J9621 Acute and chronic respiratory failure with hypoxia: Secondary | ICD-10-CM | POA: Diagnosis not present

## 2014-06-25 DIAGNOSIS — R0902 Hypoxemia: Secondary | ICD-10-CM

## 2014-06-25 DIAGNOSIS — F17210 Nicotine dependence, cigarettes, uncomplicated: Secondary | ICD-10-CM | POA: Diagnosis present

## 2014-06-25 DIAGNOSIS — Z85841 Personal history of malignant neoplasm of brain: Secondary | ICD-10-CM

## 2014-06-25 DIAGNOSIS — I252 Old myocardial infarction: Secondary | ICD-10-CM

## 2014-06-25 DIAGNOSIS — Z9981 Dependence on supplemental oxygen: Secondary | ICD-10-CM

## 2014-06-25 DIAGNOSIS — J441 Chronic obstructive pulmonary disease with (acute) exacerbation: Secondary | ICD-10-CM

## 2014-06-25 DIAGNOSIS — Z7982 Long term (current) use of aspirin: Secondary | ICD-10-CM

## 2014-06-25 DIAGNOSIS — I5032 Chronic diastolic (congestive) heart failure: Secondary | ICD-10-CM | POA: Diagnosis present

## 2014-06-25 DIAGNOSIS — J45909 Unspecified asthma, uncomplicated: Secondary | ICD-10-CM | POA: Diagnosis present

## 2014-06-25 DIAGNOSIS — J1100 Influenza due to unidentified influenza virus with unspecified type of pneumonia: Secondary | ICD-10-CM | POA: Diagnosis present

## 2014-06-25 DIAGNOSIS — J189 Pneumonia, unspecified organism: Secondary | ICD-10-CM

## 2014-06-25 DIAGNOSIS — I2510 Atherosclerotic heart disease of native coronary artery without angina pectoris: Secondary | ICD-10-CM | POA: Diagnosis present

## 2014-06-25 DIAGNOSIS — G8929 Other chronic pain: Secondary | ICD-10-CM | POA: Diagnosis present

## 2014-06-25 DIAGNOSIS — Z9049 Acquired absence of other specified parts of digestive tract: Secondary | ICD-10-CM | POA: Diagnosis present

## 2014-06-25 LAB — CBC WITH DIFFERENTIAL/PLATELET
BASOS ABS: 0 10*3/uL (ref 0.0–0.1)
BASOS PCT: 0 % (ref 0–1)
Eosinophils Absolute: 0 10*3/uL (ref 0.0–0.7)
Eosinophils Relative: 0 % (ref 0–5)
HEMATOCRIT: 42.2 % (ref 36.0–46.0)
HEMOGLOBIN: 14.9 g/dL (ref 12.0–15.0)
LYMPHS ABS: 0.9 10*3/uL (ref 0.7–4.0)
LYMPHS PCT: 19 % (ref 12–46)
MCH: 29.9 pg (ref 26.0–34.0)
MCHC: 35.3 g/dL (ref 30.0–36.0)
MCV: 84.6 fL (ref 78.0–100.0)
MONO ABS: 0.4 10*3/uL (ref 0.1–1.0)
MONOS PCT: 8 % (ref 3–12)
Neutro Abs: 3.6 10*3/uL (ref 1.7–7.7)
Neutrophils Relative %: 73 % (ref 43–77)
Platelets: 166 10*3/uL (ref 150–400)
RBC: 4.99 MIL/uL (ref 3.87–5.11)
RDW: 14.6 % (ref 11.5–15.5)
WBC: 4.9 10*3/uL (ref 4.0–10.5)

## 2014-06-25 MED ORDER — IPRATROPIUM BROMIDE 0.02 % IN SOLN
0.5000 mg | Freq: Once | RESPIRATORY_TRACT | Status: AC
  Administered 2014-06-25: 0.5 mg via RESPIRATORY_TRACT
  Filled 2014-06-25: qty 2.5

## 2014-06-25 MED ORDER — METHYLPREDNISOLONE SODIUM SUCC 125 MG IJ SOLR
125.0000 mg | Freq: Once | INTRAMUSCULAR | Status: AC
  Administered 2014-06-25: 125 mg via INTRAVENOUS
  Filled 2014-06-25: qty 2

## 2014-06-25 MED ORDER — ALBUTEROL (5 MG/ML) CONTINUOUS INHALATION SOLN
15.0000 mg/h | INHALATION_SOLUTION | Freq: Once | RESPIRATORY_TRACT | Status: AC
  Administered 2014-06-25: 15 mg/h via RESPIRATORY_TRACT
  Filled 2014-06-25: qty 20

## 2014-06-25 MED ORDER — SODIUM CHLORIDE 0.9 % IV SOLN: INTRAVENOUS | Status: DC

## 2014-06-25 MED ORDER — METOCLOPRAMIDE HCL 5 MG/ML IJ SOLN
10.0000 mg | Freq: Once | INTRAMUSCULAR | Status: AC
  Administered 2014-06-25: 10 mg via INTRAVENOUS
  Filled 2014-06-25: qty 2

## 2014-06-25 MED ORDER — DIPHENHYDRAMINE HCL 50 MG/ML IJ SOLN
25.0000 mg | Freq: Once | INTRAMUSCULAR | Status: AC
  Administered 2014-06-25: 25 mg via INTRAVENOUS
  Filled 2014-06-25: qty 1

## 2014-06-25 MED ORDER — SODIUM CHLORIDE 0.9 % IV BOLUS (SEPSIS)
1000.0000 mL | Freq: Once | INTRAVENOUS | Status: AC
  Administered 2014-06-25: 1000 mL via INTRAVENOUS

## 2014-06-25 NOTE — ED Provider Notes (Signed)
CSN: [id number]     Arrival date & time 06/25/14  2135 History  This chart was scribed for Rolland Porter, MD by Chester Holstein, ED Scribe. This patient was seen in room APA12/APA12 and the patient's care was started at 11:11 PM.    Chief Complaint  Patient presents with  . Generalized Body Aches  . Shortness of Breath     Patient is a 64 y.o. female presenting with shortness of breath. The history is provided by the patient. No language interpreter was used.  Shortness of Breath Associated symptoms: fever, headaches, sore throat and wheezing   Associated symptoms: no chest pain and no vomiting    HPI Comments: Courtney Zuniga is a 64 y.o. female with PMHx of asthma, HTN, CAD, NSTEMI, and headaches who presents to the Emergency Department complaining of SOB and generalized body aches with intermittent chills with onset 1 week ago. Pt notes associated wheezing, dry cough, headache, mild sore throat, and rhinorrhea.  Pt used inhaler and nebulizer with mild relief. Pt notes recent sick contacts with her grandchildren with URI's. Pt denies chest pain, nausea, vomiting, and diarrhea.   Pt's PCP is Dr. Shade Flood.   Past Medical History  Diagnosis Date  . Brain tumor (benign) 2005 [facility]    Benign  . Hypertension   . Asthma   . Chronic headaches   . Chronic hip pain   . Chronic pain   . NSTEMI (non-ST elevated myocardial infarction)   . Coronary artery disease    Past Surgical History  Procedure Laterality Date  . Cholecystectomy    . Tumor removal      Benign   Family History  Problem Relation Age of Onset  . Cancer Mother   . Cancer Father   . Cancer Sister    History  Substance Use Topics  . Smoking status: Current Every Day Smoker -- 0.33 packs/day    Types: Cigarettes  . Smokeless tobacco: Not on file  . Alcohol Use: No   Smokes 1 pack per day Uses oxygen 2 L/m nasal cannula at bedtime  OB History    No data available     Review of Systems  Constitutional: Positive  for fever and chills.  HENT: Positive for rhinorrhea and sore throat.   Respiratory: Positive for shortness of breath and wheezing.   Cardiovascular: Negative for chest pain.  Gastrointestinal: Negative for vomiting and diarrhea.  Musculoskeletal: Positive for myalgias.  Neurological: Positive for headaches.  All other systems reviewed and are negative.     Allergies  Review of patient's allergies indicates no known allergies.  Home Medications   Prior to Admission medications   Medication Sig Start Date End Date Taking? Authorizing Provider  albuterol (PROVENTIL HFA;VENTOLIN HFA) 108 (90 BASE) MCG/ACT inhaler Inhale 2 puffs into the lungs every 6 (six) hours as needed. Shortness of Breath   Yes Historical Provider, MD  albuterol (PROVENTIL) (2.5 MG/3ML) 0.083% nebulizer solution Take 2.5 mg by nebulization every 6 (six) hours as needed for wheezing or shortness of breath.   Yes Historical Provider, MD  Aspirin-Acetaminophen-Caffeine 260-130-16 MG TABS Take 1 packet by mouth every 4 (four) hours as needed (Pain).   Yes Historical Provider, MD  calcium-vitamin D 250-100 MG-UNIT per tablet Take 1 tablet by mouth daily.   Yes Historical Provider, MD  naproxen (NAPROSYN) 500 MG tablet Take 1 tablet (500 mg total) by mouth 2 (two) times daily. Patient taking differently: Take 500 mg by mouth 2 (two) times daily as needed  for mild pain.  05/03/14  Yes Tanna Furry, MD  aspirin EC 81 MG tablet Take 81 mg by mouth daily.    Historical Provider, MD  DULoxetine (CYMBALTA) 30 MG capsule Take 30 mg by mouth 2 (two) times daily.  09/22/13   Historical Provider, MD  Fluticasone-Salmeterol (ADVAIR) 500-50 MCG/DOSE AEPB Inhale 1 puff into the lungs every 12 (twelve) hours.    Historical Provider, MD  gabapentin (NEURONTIN) 300 MG capsule Take 300 mg by mouth 3 (three) times daily.     Historical Provider, MD  HYDROcodone-acetaminophen (NORCO/VICODIN) 5-325 MG per tablet Take 2 tablets by mouth every 4  (four) hours as needed. Patient not taking: Reported on 06/25/2014 05/03/14   Tanna Furry, MD  HYDROcodone-acetaminophen (NORCO/VICODIN) 5-325 MG per tablet Take 1 tablet by mouth every 6 (six) hours as needed for moderate pain. Patient not taking: Reported on 06/25/2014 05/23/14   Carole Civil, MD  isosorbide mononitrate (IMDUR) 60 MG 24 hr tablet Take 60 mg by mouth daily.  09/22/13   Historical Provider, MD  methylPREDNISolone (MEDROL DOSEPAK) 4 MG tablet 6 po on day 1, decrease by 1 tab per day Patient not taking: Reported on 05/23/2014 05/03/14   Tanna Furry, MD  nitroGLYCERIN (NITROSTAT) 0.4 MG SL tablet Place 0.4 mg under the tongue every 5 (five) minutes as needed for chest pain.  02/15/13   Historical Provider, MD  pantoprazole (PROTONIX) 40 MG tablet Take 1 tablet (40 mg total) by mouth daily. Patient not taking: Reported on 05/03/2014 02/17/14   Kathie Dike, MD  QVAR 80 MCG/ACT inhaler Inhale 1 puff into the lungs 2 (two) times daily.  02/13/14   Historical Provider, MD   ED Triage Vitals  Enc Vitals Group     BP 06/25/14 2140 123/91 mmHg     Pulse Rate 06/25/14 2140 110     Resp 06/25/14 2140 24     Temp 06/25/14 2140 100 F (37.8 C)     Temp Source 06/25/14 2140 Oral     SpO2 06/25/14 2140 100 %     Weight 06/25/14 2140 156 lb (70.761 kg)     Height 06/25/14 2140 5\' 6"  (1.676 m)     Head Cir --      Peak Flow --      Pain Score 06/25/14 2142 10     Pain Loc --      Pain Edu? --      Excl. in [place]? --     Vital signs normal except for low-grade fever  Physical Exam  Constitutional: She is oriented to person, place, and time. She appears well-developed and well-nourished.  Non-toxic appearance. She does not appear ill. No distress.  HENT:  Head: Normocephalic and atraumatic.  Right Ear: External ear normal.  Left Ear: External ear normal.  Nose: Nose normal. No mucosal edema or rhinorrhea.  Mouth/Throat: Oropharynx is clear and moist and mucous membranes are normal. No  dental abscesses or uvula swelling.  Eyes: Conjunctivae and EOM are normal. Pupils are equal, round, and reactive to light.  Neck: Normal range of motion and full passive range of motion without pain. Neck supple.  Cardiovascular: Normal rate, regular rhythm and normal heart sounds.  Exam reveals no gallop and no friction rub.   No murmur heard. Pulmonary/Chest: Accessory muscle usage present. Tachypnea noted. She is in respiratory distress. She has decreased breath sounds. She has wheezes. She has no rhonchi. She has no rales. She exhibits no tenderness and no crepitus.  Coughing frequently, diminished breaths sounds, rare wheeze  Abdominal: Soft. Normal appearance and bowel sounds are normal. She exhibits no distension. There is no tenderness. There is no rebound and no guarding.  Musculoskeletal: Normal range of motion. She exhibits no edema or tenderness.  Moves all extremities well.   Neurological: She is alert and oriented to person, place, and time. She has normal strength. No cranial nerve deficit.  Skin: Skin is warm, dry and intact. No rash noted. No erythema. No pallor.  Psychiatric: She has a normal mood and affect. Her speech is normal and behavior is normal. Her mood appears not anxious.  Nursing note and vitals reviewed.   ED Course  Procedures (including critical care time)  Medications  0.9 %  sodium chloride infusion ( Intravenous New Bag/Given 06/26/14 0156)  azithromycin (ZITHROMAX) 500 mg in dextrose 5 % 250 mL IVPB (not administered)  sodium chloride 0.9 % bolus 1,000 mL (0 mLs Intravenous Stopped 06/26/14 0154)  albuterol (PROVENTIL,VENTOLIN) solution continuous neb (15 mg/hr Nebulization Given 06/25/14 2325)  ipratropium (ATROVENT) nebulizer solution 0.5 mg (0.5 mg Nebulization Given 06/25/14 2325)  metoCLOPramide (REGLAN) injection 10 mg (10 mg Intravenous Given 06/25/14 2341)  diphenhydrAMINE (BENADRYL) injection 25 mg (25 mg Intravenous Given 06/25/14 2340)   methylPREDNISolone sodium succinate (SOLU-MEDROL) 125 mg/2 mL injection 125 mg (125 mg Intravenous Given 06/25/14 2340)  cefTRIAXone (ROCEPHIN) 1 g in dextrose 5 % 50 mL IVPB (1 g Intravenous New Bag/Given 06/26/14 0306)    DIAGNOSTIC STUDIES: Oxygen Saturation is 100% on room air, normal by my interpretation.    COORDINATION OF CARE: 11:16 PM Discussed treatment plan with patient at beside, the patient agrees with the plan and has no further questions at this time.  Patient given a continuous nebulizer. She was given IV Benadryl and Reglan for her complaints of headache. She was given IV steroids which would help with both her headache and her respiratory symptoms.  Patient was rechecked after her continuous nebulizer. Her lungs had improved air movement and her wheezing was gone however she still appeared to have some tachypnea. She also felt very warm to touch and I had nursing recheck her temperature which was still 100. After reviewing her x-ray she was started on community-acquired pneumonia antibiotics when her BNP returned as within normal limits (her chest x-ray was read as possible pneumonia or interstitial edema).  Patient was ambulated by nursing staff and her pulse ox dropped to 85% on room air and she said it made her breathing a lot worse. Patient is willing to be admitted.  04:43 Dr Darrick Meigs, admit to tele   Labs Review Results for orders placed or performed during the hospital encounter of 06/25/14  Comprehensive metabolic panel  Result Value Ref Range   Sodium 136 135 - 145 mmol/L   Potassium 3.4 (L) 3.5 - 5.1 mmol/L   Chloride 103 96 - 112 mmol/L   CO2 24 19 - 32 mmol/L   Glucose, Bld 97 70 - 99 mg/dL   BUN 13 6 - 23 mg/dL   Creatinine, Ser 0.72 0.50 - 1.10 mg/dL   Calcium 8.7 8.4 - 10.5 mg/dL   Total Protein 7.3 6.0 - 8.3 g/dL   Albumin 4.0 3.5 - 5.2 g/dL   AST 20 0 - 37 U/L   ALT 11 0 - 35 U/L   Alkaline Phosphatase 162 (H) 39 - 117 U/L   Total Bilirubin 0.4 0.3 -  1.2 mg/dL   GFR calc non Af Amer 88 (L) >  90 mL/min   GFR calc Af Amer >90 >90 mL/min   Anion gap 9 5 - 15  CBC with Differential  Result Value Ref Range   WBC 4.9 4.0 - 10.5 K/uL   RBC 4.99 3.87 - 5.11 MIL/uL   Hemoglobin 14.9 12.0 - 15.0 g/dL   HCT 42.2 36.0 - 46.0 %   MCV 84.6 78.0 - 100.0 fL   MCH 29.9 26.0 - 34.0 pg   MCHC 35.3 30.0 - 36.0 g/dL   RDW 14.6 11.5 - 15.5 %   Platelets 166 150 - 400 K/uL   Neutrophils Relative % 73 43 - 77 %   Neutro Abs 3.6 1.7 - 7.7 K/uL   Lymphocytes Relative 19 12 - 46 %   Lymphs Abs 0.9 0.7 - 4.0 K/uL   Monocytes Relative 8 3 - 12 %   Monocytes Absolute 0.4 0.1 - 1.0 K/uL   Eosinophils Relative 0 0 - 5 %   Eosinophils Absolute 0.0 0.0 - 0.7 K/uL   Basophils Relative 0 0 - 1 %   Basophils Absolute 0.0 0.0 - 0.1 K/uL  Troponin I  Result Value Ref Range   Troponin I <0.03 <0.031 ng/mL  Brain natriuretic peptide  Result Value Ref Range   B Natriuretic Peptide 12.0 0.0 - 100.0 pg/mL   Laboratory interpretation all normal except mild hypokalemia     Imaging Review Dg Chest Portable 1 View  06/26/2014   CLINICAL DATA:  Acute onset of shortness of breath and generalized body aches. Wheezing, dry cough, chills, headache, mild sore throat and rhinorrhea. Initial encounter.  EXAM: PORTABLE CHEST - 1 VIEW  COMPARISON:  Chest radiograph performed 03/31/2014  FINDINGS: The lungs are hyperexpanded, with flattening of the hemidiaphragms, compatible with COPD. Increased interstitial markings are seen. Underlying vascular congestion is noted. This may reflect mild interstitial edema or possibly pneumonia. There is no evidence of pleural effusion or pneumothorax. Bilateral nipple shadows are noted.  The cardiomediastinal silhouette is within normal limits. No acute osseous abnormalities are seen.  IMPRESSION: 1. Increased interstitial markings and underlying vascular congestion. This may reflect mild interstitial edema or possibly pneumonia. 2. Findings of  COPD.   Electronically Signed   By: Garald Balding M.D.   On: 06/26/2014 00:01     EKG Interpretation None      MDM   Final diagnoses:  COPD exacerbation  CAP (community acquired pneumonia)  Hypoxia    Plan admission   CRITICAL CARE Performed by: Rolland Porter L Total critical care time: 39 min Critical care time was exclusive of separately billable procedures and treating other patients. Critical care was necessary to treat or prevent imminent or life-threatening deterioration. Critical care was time spent personally by me on the following activities: development of treatment plan with patient and/or surrogate as well as nursing, discussions with consultants, evaluation of patient's response to treatment, examination of patient, obtaining history from patient or surrogate, ordering and performing treatments and interventions, ordering and review of laboratory studies, ordering and review of radiographic studies, pulse oximetry and re-evaluation of patient's condition.   I personally performed the services described in this documentation, which was scribed in my presence. The recorded information has been reviewed and considered.  Rolland Porter, MD, Barbette Or, MD 06/26/14 [phone]

## 2014-06-25 NOTE — ED Notes (Signed)
Pt c/o generalized body aches since Friday with cough. Took a breathing tx last night. Reports having dyspnea with exertion and the breathing tx not helping as much.

## 2014-06-26 ENCOUNTER — Encounter (HOSPITAL_COMMUNITY): Payer: Self-pay | Admitting: *Deleted

## 2014-06-26 DIAGNOSIS — R0602 Shortness of breath: Secondary | ICD-10-CM | POA: Diagnosis present

## 2014-06-26 DIAGNOSIS — J189 Pneumonia, unspecified organism: Secondary | ICD-10-CM | POA: Diagnosis present

## 2014-06-26 DIAGNOSIS — J45909 Unspecified asthma, uncomplicated: Secondary | ICD-10-CM | POA: Diagnosis present

## 2014-06-26 DIAGNOSIS — I252 Old myocardial infarction: Secondary | ICD-10-CM | POA: Diagnosis not present

## 2014-06-26 DIAGNOSIS — J9621 Acute and chronic respiratory failure with hypoxia: Secondary | ICD-10-CM | POA: Diagnosis present

## 2014-06-26 DIAGNOSIS — G8929 Other chronic pain: Secondary | ICD-10-CM | POA: Diagnosis present

## 2014-06-26 DIAGNOSIS — R0902 Hypoxemia: Secondary | ICD-10-CM | POA: Diagnosis not present

## 2014-06-26 DIAGNOSIS — F17210 Nicotine dependence, cigarettes, uncomplicated: Secondary | ICD-10-CM | POA: Diagnosis present

## 2014-06-26 DIAGNOSIS — J441 Chronic obstructive pulmonary disease with (acute) exacerbation: Secondary | ICD-10-CM | POA: Diagnosis present

## 2014-06-26 DIAGNOSIS — I2510 Atherosclerotic heart disease of native coronary artery without angina pectoris: Secondary | ICD-10-CM | POA: Diagnosis present

## 2014-06-26 DIAGNOSIS — Z9049 Acquired absence of other specified parts of digestive tract: Secondary | ICD-10-CM | POA: Diagnosis present

## 2014-06-26 DIAGNOSIS — Z9981 Dependence on supplemental oxygen: Secondary | ICD-10-CM | POA: Diagnosis not present

## 2014-06-26 DIAGNOSIS — J440 Chronic obstructive pulmonary disease with acute lower respiratory infection: Secondary | ICD-10-CM | POA: Diagnosis present

## 2014-06-26 DIAGNOSIS — Z7982 Long term (current) use of aspirin: Secondary | ICD-10-CM | POA: Diagnosis not present

## 2014-06-26 DIAGNOSIS — I5032 Chronic diastolic (congestive) heart failure: Secondary | ICD-10-CM | POA: Diagnosis present

## 2014-06-26 DIAGNOSIS — E860 Dehydration: Secondary | ICD-10-CM | POA: Diagnosis present

## 2014-06-26 DIAGNOSIS — J1100 Influenza due to unidentified influenza virus with unspecified type of pneumonia: Secondary | ICD-10-CM | POA: Diagnosis present

## 2014-06-26 DIAGNOSIS — I10 Essential (primary) hypertension: Secondary | ICD-10-CM

## 2014-06-26 DIAGNOSIS — Z85841 Personal history of malignant neoplasm of brain: Secondary | ICD-10-CM | POA: Diagnosis not present

## 2014-06-26 LAB — BRAIN NATRIURETIC PEPTIDE: B NATRIURETIC PEPTIDE 5: 12 pg/mL (ref 0.0–100.0)

## 2014-06-26 LAB — COMPREHENSIVE METABOLIC PANEL
ALBUMIN: 4 g/dL (ref 3.5–5.2)
ALK PHOS: 162 U/L — AB (ref 39–117)
ALT: 11 U/L (ref 0–35)
AST: 20 U/L (ref 0–37)
Anion gap: 9 (ref 5–15)
BUN: 13 mg/dL (ref 6–23)
CHLORIDE: 103 mmol/L (ref 96–112)
CO2: 24 mmol/L (ref 19–32)
Calcium: 8.7 mg/dL (ref 8.4–10.5)
Creatinine, Ser: 0.72 mg/dL (ref 0.50–1.10)
GFR calc Af Amer: >90 mL/min (ref >90)
GFR calc non Af Amer: 88 mL/min — ABNORMAL LOW (ref >90)
Glucose, Bld: 97 mg/dL (ref 70–99)
Potassium: 3.4 mmol/L — ABNORMAL LOW (ref 3.5–5.1)
Sodium: 136 mmol/L (ref 135–145)
TOTAL PROTEIN: 7.3 g/dL (ref 6.0–8.3)
Total Bilirubin: 0.4 mg/dL (ref 0.3–1.2)

## 2014-06-26 LAB — INFLUENZA PANEL BY PCR (TYPE A & B)
H1N1 flu by pcr: NOT DETECTED
Influenza A By PCR: POSITIVE — AB
Influenza B By PCR: NEGATIVE

## 2014-06-26 LAB — TROPONIN I: Troponin I: <0.03 ng/mL (ref <0.031)

## 2014-06-26 MED ORDER — ALBUTEROL SULFATE (2.5 MG/3ML) 0.083% IN NEBU: 2.5000 mg | INHALATION_SOLUTION | RESPIRATORY_TRACT | Status: DC | PRN

## 2014-06-26 MED ORDER — DEXTROSE 5 % IV SOLN
500.0000 mg | INTRAVENOUS | Status: DC
  Administered 2014-06-27 – 2014-06-28 (×2): 500 mg via INTRAVENOUS
  Filled 2014-06-26 (×3): qty 500

## 2014-06-26 MED ORDER — ACETAMINOPHEN 325 MG PO TABS
650.0000 mg | ORAL_TABLET | Freq: Four times a day (QID) | ORAL | Status: DC | PRN
Start: 2014-06-26 — End: 2014-06-29
  Administered 2014-06-26 – 2014-06-29 (×4): 650 mg via ORAL
  Filled 2014-06-26 (×4): qty 2

## 2014-06-26 MED ORDER — HYDROCODONE-ACETAMINOPHEN 5-325 MG PO TABS
2.0000 | ORAL_TABLET | ORAL | Status: DC | PRN
  Administered 2014-06-26 (×2): 2 via ORAL
  Filled 2014-06-26 (×2): qty 2

## 2014-06-26 MED ORDER — SODIUM CHLORIDE 0.9 % IV SOLN
INTRAVENOUS | Status: DC
  Administered 2014-06-26 – 2014-06-27 (×3): via INTRAVENOUS

## 2014-06-26 MED ORDER — MOMETASONE FURO-FORMOTEROL FUM 200-5 MCG/ACT IN AERO
2.0000 | INHALATION_SPRAY | Freq: Two times a day (BID) | RESPIRATORY_TRACT | Status: DC
  Administered 2014-06-26 – 2014-06-29 (×7): 2 via RESPIRATORY_TRACT
  Filled 2014-06-26: qty 8.8

## 2014-06-26 MED ORDER — ACETAMINOPHEN 650 MG RE SUPP: 650.0000 mg | Freq: Four times a day (QID) | RECTAL | Status: DC | PRN

## 2014-06-26 MED ORDER — PANTOPRAZOLE SODIUM 40 MG PO TBEC
40.0000 mg | DELAYED_RELEASE_TABLET | Freq: Every day | ORAL | Status: DC
  Administered 2014-06-26 – 2014-06-29 (×4): 40 mg via ORAL
  Filled 2014-06-26 (×4): qty 1

## 2014-06-26 MED ORDER — ONDANSETRON HCL 4 MG/2ML IJ SOLN: 4.0000 mg | Freq: Four times a day (QID) | INTRAMUSCULAR | Status: DC | PRN

## 2014-06-26 MED ORDER — ENOXAPARIN SODIUM 40 MG/0.4ML ~~LOC~~ SOLN
40.0000 mg | SUBCUTANEOUS | Status: DC
  Administered 2014-06-26 – 2014-06-29 (×4): 40 mg via SUBCUTANEOUS
  Filled 2014-06-26 (×4): qty 0.4

## 2014-06-26 MED ORDER — ALBUTEROL SULFATE (2.5 MG/3ML) 0.083% IN NEBU: 5.0000 mg | INHALATION_SOLUTION | RESPIRATORY_TRACT | Status: AC | PRN

## 2014-06-26 MED ORDER — CEFTRIAXONE SODIUM 1 G IJ SOLR
1.0000 g | Freq: Once | INTRAMUSCULAR | Status: AC
  Administered 2014-06-26: 1 g via INTRAVENOUS
  Filled 2014-06-26: qty 10

## 2014-06-26 MED ORDER — DEXTROSE 5 % IV SOLN
500.0000 mg | INTRAVENOUS | Status: DC
  Administered 2014-06-26: 500 mg via INTRAVENOUS
  Filled 2014-06-26 (×2): qty 500

## 2014-06-26 MED ORDER — IPRATROPIUM-ALBUTEROL 0.5-2.5 (3) MG/3ML IN SOLN
3.0000 mL | RESPIRATORY_TRACT | Status: DC
  Administered 2014-06-26 (×5): 3 mL via RESPIRATORY_TRACT
  Filled 2014-06-26 (×5): qty 3

## 2014-06-26 MED ORDER — DULOXETINE HCL 30 MG PO CPEP
30.0000 mg | ORAL_CAPSULE | Freq: Two times a day (BID) | ORAL | Status: DC
  Administered 2014-06-26 – 2014-06-29 (×7): 30 mg via ORAL
  Filled 2014-06-26 (×7): qty 1

## 2014-06-26 MED ORDER — GABAPENTIN 300 MG PO CAPS
300.0000 mg | ORAL_CAPSULE | Freq: Three times a day (TID) | ORAL | Status: DC
  Administered 2014-06-26 – 2014-06-29 (×10): 300 mg via ORAL
  Filled 2014-06-26 (×10): qty 1

## 2014-06-26 MED ORDER — ASPIRIN EC 81 MG PO TBEC
81.0000 mg | DELAYED_RELEASE_TABLET | Freq: Every day | ORAL | Status: DC
  Administered 2014-06-26 – 2014-06-29 (×4): 81 mg via ORAL
  Filled 2014-06-26 (×4): qty 1

## 2014-06-26 MED ORDER — IPRATROPIUM BROMIDE 0.02 % IN SOLN: 0.5000 mg | Freq: Four times a day (QID) | RESPIRATORY_TRACT | Status: DC

## 2014-06-26 MED ORDER — ONDANSETRON HCL 4 MG PO TABS: 4.0000 mg | ORAL_TABLET | Freq: Four times a day (QID) | ORAL | Status: DC | PRN

## 2014-06-26 MED ORDER — FLUTICASONE PROPIONATE HFA 44 MCG/ACT IN AERO
1.0000 | INHALATION_SPRAY | Freq: Two times a day (BID) | RESPIRATORY_TRACT | Status: DC
Start: 2014-06-26 — End: 2014-06-29
  Administered 2014-06-26 – 2014-06-29 (×7): 1 via RESPIRATORY_TRACT
  Filled 2014-06-26: qty 10.6

## 2014-06-26 MED ORDER — IPRATROPIUM-ALBUTEROL 0.5-2.5 (3) MG/3ML IN SOLN: 3.0000 mL | Freq: Four times a day (QID) | RESPIRATORY_TRACT | Status: DC

## 2014-06-26 MED ORDER — SODIUM CHLORIDE 0.9 % IV SOLN
INTRAVENOUS | Status: DC
  Administered 2014-06-26: 10:00:00 via INTRAVENOUS

## 2014-06-26 MED ORDER — POTASSIUM CHLORIDE CRYS ER 20 MEQ PO TBCR
40.0000 meq | EXTENDED_RELEASE_TABLET | Freq: Once | ORAL | Status: AC
  Administered 2014-06-26: 40 meq via ORAL
  Filled 2014-06-26: qty 2

## 2014-06-26 MED ORDER — ENSURE COMPLETE PO LIQD
237.0000 mL | Freq: Three times a day (TID) | ORAL | Status: DC
  Administered 2014-06-26 – 2014-06-28 (×8): 237 mL via ORAL

## 2014-06-26 MED ORDER — DEXTROSE 5 % IV SOLN
1.0000 g | INTRAVENOUS | Status: DC
  Administered 2014-06-27: 1 g via INTRAVENOUS
  Filled 2014-06-26 (×3): qty 10

## 2014-06-26 MED ORDER — ALBUTEROL SULFATE (2.5 MG/3ML) 0.083% IN NEBU: 2.5000 mg | INHALATION_SOLUTION | Freq: Four times a day (QID) | RESPIRATORY_TRACT | Status: DC

## 2014-06-26 MED ORDER — METHYLPREDNISOLONE SODIUM SUCC 125 MG IJ SOLR
60.0000 mg | Freq: Four times a day (QID) | INTRAMUSCULAR | Status: DC
  Administered 2014-06-26 – 2014-06-29 (×13): 60 mg via INTRAVENOUS
  Filled 2014-06-26 (×13): qty 2

## 2014-06-26 MED ORDER — ISOSORBIDE MONONITRATE ER 60 MG PO TB24
60.0000 mg | ORAL_TABLET | Freq: Every day | ORAL | Status: DC
  Administered 2014-06-26 – 2014-06-29 (×4): 60 mg via ORAL
  Filled 2014-06-26 (×4): qty 1

## 2014-06-26 MED ORDER — CEFTRIAXONE SODIUM IN DEXTROSE 20 MG/ML IV SOLN
1.0000 g | INTRAVENOUS | Status: DC
  Filled 2014-06-26: qty 50

## 2014-06-26 MED ORDER — GUAIFENESIN ER 600 MG PO TB12
1200.0000 mg | ORAL_TABLET | Freq: Two times a day (BID) | ORAL | Status: DC
  Administered 2014-06-26 – 2014-06-29 (×7): 1200 mg via ORAL
  Filled 2014-06-26 (×7): qty 2

## 2014-06-26 NOTE — Care Management Utilization Note (Signed)
UR completed 

## 2014-06-26 NOTE — Care Management Note (Addendum)
    Page 1 of 2   06/29/2014     11:11:38 AM CARE MANAGEMENT NOTE 06/29/2014  Patient:  Courtney Zuniga, Courtney Zuniga   Account Number:  [id number]  Date Initiated:  06/26/2014  Documentation initiated by:  Jolene Provost  Subjective/Objective Assessment:   Pt is from home with self care. Pt has home O2 and neb machine. Pt has no other DME's or [place] services prior to admission. Anticipate need for close monitoring and HH services at discharge. Pt plans to dsicharge home. Will cont to follow.     Action/Plan:   Anticipated DC Date:  06/29/2014   Anticipated DC Plan:  [place]  CM consult      PAC Choice  [place]   Choice offered to / List presented to:  C-1 Patient   DME arranged  Vassie Moselle      DME agency  [place] arranged  HH-1 RN  [place]      [place].   Status of service:  Completed, signed off Medicare Important Message given?  YES (If response is "NO", the following Medicare IM given date fields will be blank) Date Medicare IM given:  06/28/2014 Medicare IM given by:  Jolene Provost Date Additional Medicare IM given:   Additional Medicare IM given by:    Discharge Disposition:  [place]  Per UR Regulation:  Reviewed for med. necessity/level of care/duration of stay  If discussed at [place] of Stay Meetings, dates discussed:   06/29/2014    Comments:  06/29/2014 [place], RN, MSN, CM Pt being discharge home today with [facility] services throughh [facility]. Romualdo Bolk made aware of discharge. Home O2 assessment this morning revealed pt did not meet qualifications for continuous home O2. Pt does have home O2 for nightime use. No further CM needs.  06/28/2014 [place], RN, MSN, CM Pt anticipates discharge on 3/17. Pt will need [place] services at discharge. Pt has choosen [facility] for [facility]  services and rolling walker. Romualdo Bolk of [facility] notified and will obtain pt information from chart. Terrence Dupont, of [facility] notified and will obtain pt information from chart and have walker delivered to pt's home. Pt will need home O2 assessment prior to dishcarge. Previously on home O2 only at night. Pt received home O2 through [place]. Spoke with Pt's daughter, Courtney Zuniga, about discharge plan, per pt's request. Pt's daughter agreeable to happy with discharge plan.  06/26/2014 [place], RN, MSN, CM

## 2014-06-26 NOTE — Progress Notes (Signed)
Patient admitted to the hospital earlier this morning by Dr. Darrick Meigs.   Patient seen and examined.  She was admitted to the hospital with progressive shortness of breath and found to have pneumonia on chest xray. She has been started on antibiotics. Will check influenza pcr. She also has a copd exacerbation and started on bronchodilators and steroids. Continue current treatments  MEMON,JEHANZEB

## 2014-06-26 NOTE — H&P (Signed)
PCP:   Petra Kuba, MD   Chief Complaint:  Shortness of breath  HPI: 64 year old female who   has a past medical history of Brain tumor (benign) (2005 [facility]); Hypertension; Asthma; Chronic headaches; Chronic hip pain; Chronic pain; NSTEMI (non-ST elevated myocardial infarction); and Coronary artery disease. Today came to the hospital with chief comment of shortness of breath for past 1 week. She also has been feeling intermittent chills and body aches dry cough and headache. She was nebulizer at home which was not much helpful. She denies chest pain no nausea vomiting or diarrhea. In the ED chest x-ray was done which showed possible pneumonia versus pulmonary edema. Patient's BNP is 12.0. Her oxygen saturation dropped to 80s on exertion  Allergies:  No Known Allergies    Past Medical History  Diagnosis Date  . Brain tumor (benign) 2005 [facility]    Benign  . Hypertension   . Asthma   . Chronic headaches   . Chronic hip pain   . Chronic pain   . NSTEMI (non-ST elevated myocardial infarction)   . Coronary artery disease     Past Surgical History  Procedure Laterality Date  . Cholecystectomy    . Tumor removal      Benign    Prior to Admission medications   Medication Sig Start Date End Date Taking? Authorizing Provider  albuterol (PROVENTIL HFA;VENTOLIN HFA) 108 (90 BASE) MCG/ACT inhaler Inhale 2 puffs into the lungs every 6 (six) hours as needed. Shortness of Breath   Yes Historical Provider, MD  albuterol (PROVENTIL) (2.5 MG/3ML) 0.083% nebulizer solution Take 2.5 mg by nebulization every 6 (six) hours as needed for wheezing or shortness of breath.   Yes Historical Provider, MD  Aspirin-Acetaminophen-Caffeine 260-130-16 MG TABS Take 1 packet by mouth every 4 (four) hours as needed (Pain).   Yes Historical Provider, MD  calcium-vitamin D 250-100 MG-UNIT per tablet Take 1 tablet by mouth daily.   Yes Historical Provider, MD  naproxen (NAPROSYN) 500 MG tablet Take 1  tablet (500 mg total) by mouth 2 (two) times daily. Patient taking differently: Take 500 mg by mouth 2 (two) times daily as needed for mild pain.  05/03/14  Yes Tanna Furry, MD  aspirin EC 81 MG tablet Take 81 mg by mouth daily.    Historical Provider, MD  DULoxetine (CYMBALTA) 30 MG capsule Take 30 mg by mouth 2 (two) times daily.  09/22/13   Historical Provider, MD  Fluticasone-Salmeterol (ADVAIR) 500-50 MCG/DOSE AEPB Inhale 1 puff into the lungs every 12 (twelve) hours.    Historical Provider, MD  gabapentin (NEURONTIN) 300 MG capsule Take 300 mg by mouth 3 (three) times daily.     Historical Provider, MD  HYDROcodone-acetaminophen (NORCO/VICODIN) 5-325 MG per tablet Take 2 tablets by mouth every 4 (four) hours as needed. Patient not taking: Reported on 06/25/2014 05/03/14   Tanna Furry, MD  HYDROcodone-acetaminophen (NORCO/VICODIN) 5-325 MG per tablet Take 1 tablet by mouth every 6 (six) hours as needed for moderate pain. Patient not taking: Reported on 06/25/2014 05/23/14   Carole Civil, MD  isosorbide mononitrate (IMDUR) 60 MG 24 hr tablet Take 60 mg by mouth daily.  09/22/13   Historical Provider, MD  methylPREDNISolone (MEDROL DOSEPAK) 4 MG tablet 6 po on day 1, decrease by 1 tab per day Patient not taking: Reported on 05/23/2014 05/03/14   Tanna Furry, MD  nitroGLYCERIN (NITROSTAT) 0.4 MG SL tablet Place 0.4 mg under the tongue every 5 (five) minutes as needed for  chest pain.  02/15/13   Historical Provider, MD  pantoprazole (PROTONIX) 40 MG tablet Take 1 tablet (40 mg total) by mouth daily. Patient not taking: Reported on 05/03/2014 02/17/14   Kathie Dike, MD  QVAR 80 MCG/ACT inhaler Inhale 1 puff into the lungs 2 (two) times daily.  02/13/14   Historical Provider, MD    Social History:  reports that she has been smoking Cigarettes.  She has been smoking about 0.33 packs per day. She does not have any smokeless tobacco history on file. She reports that she does not drink alcohol or use illicit  drugs.  Family History  Problem Relation Age of Onset  . Cancer Mother   . Cancer Father   . Cancer Sister      All the positives are listed in BOLD  Review of Systems:  HEENT: Headache, blurred vision, runny nose, sore throat Neck: Hypothyroidism, hyperthyroidism,,lymphadenopathy Chest : Shortness of breath, history of COPD, Asthma Heart : Chest pain, history of coronary arterey disease GI:  Nausea, vomiting, diarrhea, constipation, GERD GU: Dysuria, urgency, frequency of urination, hematuria Neuro: Stroke, seizures, syncope Psych: Depression, anxiety, hallucinations   Physical Exam: Blood pressure 123/91, pulse 110, temperature 100 F (37.8 C), temperature source Oral, resp. rate 24, height 5\' 6"  (1.676 m), weight 70.761 kg (156 lb), SpO2 85 %. Constitutional:   Patient is a well-developed and well-nourished *female in no acute distress and cooperative with exam. Head: Normocephalic and atraumatic Mouth: Mucus membranes moist Eyes: PERRL, EOMI, conjunctivae normal Neck: Supple, No Thyromegaly Cardiovascular: RRR, S1 normal, S2 normal Pulmonary/Chest: Bilateral rhonchi Abdominal: Soft. Non-tender, non-distended, bowel sounds are normal, no masses, organomegaly, or guarding present.  Neurological: A&O x3, Strength is normal and symmetric bilaterally, cranial nerve II-XII are grossly intact, no focal motor deficit, sensory intact to light touch bilaterally.  Extremities : No Cyanosis, Clubbing or Edema  Labs on Admission:  Basic Metabolic Panel:  Recent Labs Lab 06/25/14 2306  NA 136  K 3.4*  CL 103  CO2 24  GLUCOSE 97  BUN 13  CREATININE 0.72  CALCIUM 8.7   Liver Function Tests:  Recent Labs Lab 06/25/14 2306  AST 20  ALT 11  ALKPHOS 162*  BILITOT 0.4  PROT 7.3  ALBUMIN 4.0   No results for input(s): LIPASE, AMYLASE in the last 168 hours. No results for input(s): AMMONIA in the last 168 hours. CBC:  Recent Labs Lab 06/25/14 2306  WBC 4.9    NEUTROABS 3.6  HGB 14.9  HCT 42.2  MCV 84.6  PLT 166   Cardiac Enzymes:  Recent Labs Lab 06/25/14 2306  TROPONINI <0.03    BNP (last 3 results)  Recent Labs  06/25/14 2306  BNP 12.0    ProBNP (last 3 results) No results for input(s): PROBNP in the last 8760 hours.  CBG: No results for input(s): GLUCAP in the last 168 hours.  Radiological Exams on Admission: Dg Chest Portable 1 View  06/26/2014   CLINICAL DATA:  Acute onset of shortness of breath and generalized body aches. Wheezing, dry cough, chills, headache, mild sore throat and rhinorrhea. Initial encounter.  EXAM: PORTABLE CHEST - 1 VIEW  COMPARISON:  Chest radiograph performed 03/31/2014  FINDINGS: The lungs are hyperexpanded, with flattening of the hemidiaphragms, compatible with COPD. Increased interstitial markings are seen. Underlying vascular congestion is noted. This may reflect mild interstitial edema or possibly pneumonia. There is no evidence of pleural effusion or pneumothorax. Bilateral nipple shadows are noted.  The cardiomediastinal silhouette is  within normal limits. No acute osseous abnormalities are seen.  IMPRESSION: 1. Increased interstitial markings and underlying vascular congestion. This may reflect mild interstitial edema or possibly pneumonia. 2. Findings of COPD.   Electronically Signed   By: Garald Balding M.D.   On: 06/26/2014 00:01       Assessment/Plan Principal Problem:   COPD exacerbation Active Problems:   HTN (hypertension)   Hypoxia  COPD exacerbation We'll admit the patient and start solid Medrol 60 mg IV every 6 hours, DuoNeb nebulizers every 6 hours, continue Rocephin and Zithromax.  Hypotension Patient blood pressure is low in the ED, will continue with IV normal saline at 100 mL per hour. Will be cautious with the IV fluid resuscitation as patient has history of diastolic heart failure with echo showing EF of 65% with grade 1 diastolic dysfunction.  DVT  prophylaxis Lovenox Code status:  Family discussion: No family at bedside   Time Spent on Admission: 60 minutes Edie Darley S [facility] Pager: [phone] 06/26/2014, 5:08 AM  If 7PM-7AM, please contact night-coverage  [email or web address]  Password [email or web address]

## 2014-06-27 DIAGNOSIS — J9621 Acute and chronic respiratory failure with hypoxia: Principal | ICD-10-CM

## 2014-06-27 DIAGNOSIS — J1100 Influenza due to unidentified influenza virus with unspecified type of pneumonia: Secondary | ICD-10-CM | POA: Diagnosis present

## 2014-06-27 LAB — COMPREHENSIVE METABOLIC PANEL
ALBUMIN: 3.1 g/dL — AB (ref 3.5–5.2)
ALT: 12 U/L (ref 0–35)
AST: 22 U/L (ref 0–37)
Alkaline Phosphatase: 140 U/L — ABNORMAL HIGH (ref 39–117)
Anion gap: 5 (ref 5–15)
BUN: 12 mg/dL (ref 6–23)
CALCIUM: 8.8 mg/dL (ref 8.4–10.5)
CO2: 27 mmol/L (ref 19–32)
CREATININE: 0.52 mg/dL (ref 0.50–1.10)
Chloride: 109 mmol/L (ref 96–112)
GFR calc Af Amer: >90 mL/min (ref >90)
Glucose, Bld: 141 mg/dL — ABNORMAL HIGH (ref 70–99)
Potassium: 5 mmol/L (ref 3.5–5.1)
Sodium: 141 mmol/L (ref 135–145)
Total Bilirubin: 0.1 mg/dL — ABNORMAL LOW (ref 0.3–1.2)
Total Protein: 6.2 g/dL (ref 6.0–8.3)

## 2014-06-27 LAB — CBC
HEMATOCRIT: 36.9 % (ref 36.0–46.0)
Hemoglobin: 12.5 g/dL (ref 12.0–15.0)
MCH: 28.8 pg (ref 26.0–34.0)
MCHC: 33.9 g/dL (ref 30.0–36.0)
MCV: 85 fL (ref 78.0–100.0)
Platelets: 159 10*3/uL (ref 150–400)
RBC: 4.34 MIL/uL (ref 3.87–5.11)
RDW: 14.6 % (ref 11.5–15.5)
WBC: 5.6 10*3/uL (ref 4.0–10.5)

## 2014-06-27 MED ORDER — OSELTAMIVIR PHOSPHATE 75 MG PO CAPS
75.0000 mg | ORAL_CAPSULE | Freq: Two times a day (BID) | ORAL | Status: DC
  Administered 2014-06-27 – 2014-06-29 (×5): 75 mg via ORAL
  Filled 2014-06-27 (×5): qty 1

## 2014-06-27 MED ORDER — IPRATROPIUM-ALBUTEROL 0.5-2.5 (3) MG/3ML IN SOLN
3.0000 mL | Freq: Four times a day (QID) | RESPIRATORY_TRACT | Status: DC
  Administered 2014-06-27 – 2014-06-29 (×7): 3 mL via RESPIRATORY_TRACT
  Filled 2014-06-27 (×8): qty 3

## 2014-06-27 MED ORDER — HYDROCODONE-ACETAMINOPHEN 5-325 MG PO TABS
1.0000 | ORAL_TABLET | ORAL | Status: DC | PRN
  Administered 2014-06-27: 1 via ORAL
  Administered 2014-06-27: 2 via ORAL
  Filled 2014-06-27: qty 2
  Filled 2014-06-27: qty 1

## 2014-06-27 NOTE — Progress Notes (Signed)
[facility] PROGRESS NOTE  Courtney Zuniga YIR:[id number] DOB: September 21, 1950 DOA: 06/25/2014 PCP: Petra Kuba, MD  Assessment/Plan: 1. Acute on chronic respiratory failure. Patient is chronically on oxygen at night. She has been becoming increasingly hypoxic and requires it during the day. This is likely multifactorial related to pneumonia, influenza and COPD. 2. Community-acquired pneumonia. Patient has been on Rocephin and azithromycin. Continue current treatments. 3. Influenza A. Continue on Tamiflu 4. COPD exacerbation. Wheezing appears to be improving. Continue steroids, antibiotics and bronchodilators. 5. Dehydration. Improved with IV fluids  Code Status:full code Family Communication: discussed with patient Disposition Plan: discharge home once improved   Consultants:    Procedures:    Antibiotics:  Rocephin 3/15>>  azithro 3/15>>  HPI/Subjective: Feeling a little better, has productive cough, wheezing improving  Objective: Filed Vitals:   06/27/14 0702  BP: 120/62  Pulse: 84  Temp: 98.5 F (36.9 C)  Resp: 20    Intake/Output Summary (Last 24 hours) at 06/27/14 1254 Last data filed at 06/27/14 1200  Gross per 24 hour  Intake    740 ml  Output      0 ml  Net    740 ml   Filed Weights   06/25/14 2140  Weight: 70.761 kg (156 lb)    Exam:   General:  Mild increased work of breathing  Cardiovascular: s1, s2, rrr  Respiratory: bilateral rhonchi with wheezing  Abdomen: soft, nt, nd, bs+  Musculoskeletal: no edema b/l   Data Reviewed: Basic Metabolic Panel:  Recent Labs Lab 06/25/14 2306 06/27/14 0705  NA 136 141  K 3.4* 5.0  CL 103 109  CO2 24 27  GLUCOSE 97 141*  BUN 13 12  CREATININE 0.72 0.52  CALCIUM 8.7 8.8   Liver Function Tests:  Recent Labs Lab 06/25/14 2306 06/27/14 0705  AST 20 22  ALT 11 12  ALKPHOS 162* 140*  BILITOT 0.4 0.1*  PROT 7.3 6.2  ALBUMIN 4.0 3.1*   No results for input(s): LIPASE, AMYLASE  in the last 168 hours. No results for input(s): AMMONIA in the last 168 hours. CBC:  Recent Labs Lab 06/25/14 2306 06/27/14 0705  WBC 4.9 5.6  NEUTROABS 3.6  --   HGB 14.9 12.5  HCT 42.2 36.9  MCV 84.6 85.0  PLT 166 159   Cardiac Enzymes:  Recent Labs Lab 06/25/14 2306  TROPONINI <0.03   BNP (last 3 results)  Recent Labs  06/25/14 2306  BNP 12.0    ProBNP (last 3 results) No results for input(s): PROBNP in the last 8760 hours.  CBG: No results for input(s): GLUCAP in the last 168 hours.  No results found for this or any previous visit (from the past 240 hour(s)).   Studies: Dg Chest Portable 1 View  06/26/2014   CLINICAL DATA:  Acute onset of shortness of breath and generalized body aches. Wheezing, dry cough, chills, headache, mild sore throat and rhinorrhea. Initial encounter.  EXAM: PORTABLE CHEST - 1 VIEW  COMPARISON:  Chest radiograph performed 03/31/2014  FINDINGS: The lungs are hyperexpanded, with flattening of the hemidiaphragms, compatible with COPD. Increased interstitial markings are seen. Underlying vascular congestion is noted. This may reflect mild interstitial edema or possibly pneumonia. There is no evidence of pleural effusion or pneumothorax. Bilateral nipple shadows are noted.  The cardiomediastinal silhouette is within normal limits. No acute osseous abnormalities are seen.  IMPRESSION: 1. Increased interstitial markings and underlying vascular congestion. This may reflect mild interstitial edema or possibly pneumonia. 2. Findings of  COPD.   Electronically Signed   By: Garald Balding M.D.   On: 06/26/2014 00:01    Scheduled Meds: . aspirin EC  81 mg Oral Daily  . azithromycin  500 mg Intravenous Q24H  . cefTRIAXone (ROCEPHIN)  IV  1 g Intravenous Q24H  . DULoxetine  30 mg Oral BID  . enoxaparin (LOVENOX) injection  40 mg Subcutaneous Q24H  . feeding supplement (ENSURE COMPLETE)  237 mL Oral TID BM  . fluticasone  1 puff Inhalation BID  .  gabapentin  300 mg Oral TID  . guaiFENesin  1,200 mg Oral BID  . ipratropium-albuterol  3 mL Nebulization Q6H  . isosorbide mononitrate  60 mg Oral Daily  . methylPREDNISolone (SOLU-MEDROL) injection  60 mg Intravenous Q6H  . mometasone-formoterol  2 puff Inhalation BID  . oseltamivir  75 mg Oral BID  . pantoprazole  40 mg Oral Daily   Continuous Infusions:   Principal Problem:   COPD exacerbation Active Problems:   HTN (hypertension)   Hypoxia   CAP (community acquired pneumonia)   Influenza with pneumonia    Time spent: 1mins    Courtney Zuniga,Courtney Zuniga  [facility] Pager [phone]. If 7PM-7AM, please contact night-coverage at [email or web address], password [facility] 06/27/2014, 12:54 PM  LOS: 1 day

## 2014-06-28 DIAGNOSIS — R0902 Hypoxemia: Secondary | ICD-10-CM

## 2014-06-28 LAB — CBC
HEMATOCRIT: 37 % (ref 36.0–46.0)
HEMOGLOBIN: 12.6 g/dL (ref 12.0–15.0)
MCH: 29 pg (ref 26.0–34.0)
MCHC: 34.1 g/dL (ref 30.0–36.0)
MCV: 85.3 fL (ref 78.0–100.0)
Platelets: 174 10*3/uL (ref 150–400)
RBC: 4.34 MIL/uL (ref 3.87–5.11)
RDW: 14.6 % (ref 11.5–15.5)
WBC: 7.1 10*3/uL (ref 4.0–10.5)

## 2014-06-28 LAB — BASIC METABOLIC PANEL
Anion gap: 6 (ref 5–15)
BUN: 14 mg/dL (ref 6–23)
CHLORIDE: 105 mmol/L (ref 96–112)
CO2: 29 mmol/L (ref 19–32)
Calcium: 8.6 mg/dL (ref 8.4–10.5)
Creatinine, Ser: 0.58 mg/dL (ref 0.50–1.10)
GFR calc non Af Amer: >90 mL/min (ref >90)
Glucose, Bld: 120 mg/dL — ABNORMAL HIGH (ref 70–99)
POTASSIUM: 4.8 mmol/L (ref 3.5–5.1)
Sodium: 140 mmol/L (ref 135–145)

## 2014-06-28 LAB — TROPONIN I
Troponin I: <0.03 ng/mL (ref <0.031)
Troponin I: <0.03 ng/mL (ref <0.031)

## 2014-06-28 MED ORDER — MORPHINE SULFATE 2 MG/ML IJ SOLN
2.0000 mg | Freq: Once | INTRAMUSCULAR | Status: AC
Start: 2014-06-28 — End: 2014-06-28
  Administered 2014-06-28: 2 mg via INTRAVENOUS
  Filled 2014-06-28: qty 1

## 2014-06-28 MED ORDER — LEVOFLOXACIN 750 MG PO TABS
750.0000 mg | ORAL_TABLET | Freq: Every day | ORAL | Status: DC
  Administered 2014-06-29: 750 mg via ORAL
  Filled 2014-06-28 (×2): qty 1

## 2014-06-28 MED ORDER — NICOTINE 14 MG/24HR TD PT24
14.0000 mg | MEDICATED_PATCH | Freq: Every day | TRANSDERMAL | Status: DC
  Administered 2014-06-28 – 2014-06-29 (×2): 14 mg via TRANSDERMAL
  Filled 2014-06-28 (×2): qty 1

## 2014-06-28 MED ORDER — PREDNISONE 5 MG PO TABS: 5.0000 mg | ORAL_TABLET | Freq: Every day | ORAL | Status: DC

## 2014-06-28 MED ORDER — OSELTAMIVIR PHOSPHATE 75 MG PO CAPS
75.0000 mg | ORAL_CAPSULE | Freq: Two times a day (BID) | ORAL | Status: DC
Start: 2014-06-28 — End: 2014-11-16

## 2014-06-28 MED ORDER — LEVOFLOXACIN 750 MG PO TABS: 750.0000 mg | ORAL_TABLET | Freq: Every day | ORAL | Status: DC

## 2014-06-28 NOTE — Evaluation (Signed)
Physical Therapy Evaluation Patient Details Name: Courtney Zuniga MRN: [id number] DOB: May 19, 1950 Today's Date: 06/28/2014   History of Present Illness  64 year old female who  has a past medical history of Brain tumor (benign) ([facility]); Hypertension; Asthma; Chronic headaches; Chronic hip pain; Chronic pain; NSTEMI (non-ST elevated myocardial infarction); and Coronary artery disease. Today came to the hospital with chief comment of shortness of breath for past 1 week. She also has been feeling intermittent chills and body aches dry cough and headache. She was nebulizer at home which was not much helpful. She denies chest pain no nausea vomiting or diarrhea. In the ED chest x-ray was done which showed possible pneumonia versus pulmonary edema. Patient's BNP is 12.0. Her oxygen saturation dropped to 80s on exertion    Clinical Impression  This is a case of Courtney Zuniga, a 64 year old female, presenting with deconditioning due to recent illness. Patient is seen in seated at the edge of bed with 1.5 to 2L/m of O2 nasal cannula, awake, alert, oriented, cooperative, pleasant and able to follow directions well with good sitting balance. Patient has good rehab potentials and will benefit from Physical therapy during hospital stay to be able to improve current overall functional mobility outcome to be able to return home safely. Due to recent decline in functional mobility and safety concerns, PT recommendation is to return home with HHPT and RW (2-wheeled) for energy conservation and improve functional mobility per patient/family preference.    Follow Up Recommendations Home health PT    Equipment Recommendations   (Rollin walker (2-wheeled))    Recommendations for Other Services None at this time    Precautions / Restrictions Precautions Precautions: Fall Restrictions Weight Bearing Restrictions: No      Mobility  Bed Mobility Overal bed mobility: Modified Independent  General  bed mobility comments: Activity limited by SOB  Transfers Overall transfer level: Needs assistance Equipment used: Rolling walker (2 wheeled) Transfers: Sit to/from [facility] Sit to Stand: Min guard (Knee Buckiling noted) Stand pivot transfers: Min guard (unsteadiness due to generalized weakness of BLE)   Ambulation/Gait Ambulation/Gait assistance: Modified independent (Device/Increase time) Ambulation Distance (Feet): 100 Feet Assistive device: Rolling walker (2 wheeled) Gait Pattern/deviations: WFL(Within Functional Limits);Step-through patternGait velocity interpretation: at or above normal speed for age/gender (Activity is limited by SOB and fatigue)        Balance Overall balance assessment: Needs assistance Sitting-balance support: Bilateral upper extremity supported;Feet supported Sitting balance-Leahy Scale: Good     Standing balance support: During functional activity;Bilateral upper extremity supported Standing balance-Leahy Scale: Fair (activity limited by SOB and fatigue.Due to generalized weakness on BLE and deconditioning resulting in balance deficit)        Pertinent Vitals/Pain Pain Assessment: No/denies pain    Home Living Family/patient expects to be discharged to:: Private residence Living Arrangements: Alone Available Help at Discharge: Home health;Friend(s) (Friends comes to check her everyday) Type of Home: Mobile home Home Access: Stairs to enter Entrance Stairs-Rails: Left Entrance Stairs-Number of Steps: 3 Home Layout: One level Home Equipment: Other (comment) (oxygen tank near the bed)      Prior Function Level of Independence: Independent (Patient needs assist but does not want to bother family/friends )      ADL's / Homemaking Assistance Needed: Patient is able to do ADL with extra time to complete the activity due to SOB  Comments: Patient is home bound with no AD and is able to do ADL with extra time to complete the  activity due to SOB     Hand Dominance   Dominant Hand: Right    Extremity/Trunk Assessment   Upper Extremity Assessment: Defer to OT evaluation  Lower Extremity Assessment: Generalized weakness (Deconditioned)   Cervical / Trunk Assessment: Kyphotic    Communication   Communication: No difficulties  Cognition Arousal/Alertness: Awake/alert Behavior During Therapy: WFL for tasks assessed/performed Overall Cognitive Status: Within Functional Limits for tasks assessed                Assessment/Plan    PT Assessment Patient needs continued PT services  PT Diagnosis Difficulty walking;Generalized weakness (Deconditioned BLE)   PT Problem List Decreased strength;Decreased activity tolerance;Decreased balance;Decreased mobility;Decreased safety awareness  PT Treatment Interventions Gait training;Functional mobility training;Therapeutic activities;Therapeutic exercise;Patient/family education   PT Goals (Current goals can be found in the Care Plan section) Acute Rehab PT Goals Patient Stated Goal: To return home PT Goal Formulation: With patient Time For Goal Achievement: 06/28/14 Potential to Achieve Goals: Good    Frequency Min 3X/week   Barriers to discharge none at this time       End of Session Equipment Utilized During Treatment: Gait belt Activity Tolerance: Patient tolerated treatment well;Patient limited by fatigue (Activity limited by SOB) Patient left: in chair;with call bell/phone within reach           Time: 1144-1212 PT Time Calculation (min) (ACUTE ONLY): 28 min   Charges:   PT Evaluation $Initial PT Evaluation Tier I: 1 Procedure      Shilo Pauwels A 06/28/2014, 12:36 PM

## 2014-06-28 NOTE — Progress Notes (Signed)
[facility] PROGRESS NOTE  Courtney Zuniga VQM:[id number] DOB: 06-Nov-1950 DOA: 06/25/2014 PCP: Petra Kuba, MD  Assessment/Plan:  Acute on chronic respiratory failure. Patient is chronically on oxygen at night. She has been becoming increasingly hypoxic and requires it during the day. This is likely multifactorial related to pneumonia, influenza and COPD. Patient remains O2 dependent. Continue tx as per below. Cont to wean O2 as tolerated.  Community-acquired pneumonia. Patient has been on Rocephin and azithromycin. Afebrile. Will transition to PO levaquin  Influenza A. Continue on Tamiflu. Continue droplet precautions  COPD exacerbation. Decreased breath sounds.  Dehydration. Improved with IV fluids  Code Status: Full Family Communication: Pt in room Disposition Plan: Pending   Consultants:    Procedures:    Antibiotics:  Rocephin 3/15>>3/16  azithro 3/15>>3/16  Levaquin 3/16>>>  HPI/Subjective: Eager to go home  Objective: Filed Vitals:   06/27/14 1917 06/27/14 2230 06/28/14 0650 06/28/14 0728  BP:  128/64 118/68   Pulse:  77 100   Temp:  98.6 F (37 C) 97.9 F (36.6 C)   TempSrc:  Oral Oral   Resp:  20 20   Height:      Weight:      SpO2: 93% 94% 95% 96%    Intake/Output Summary (Last 24 hours) at 06/28/14 1601 Last data filed at 06/27/14 1800  Gross per 24 hour  Intake    120 ml  Output      0 ml  Net    120 ml   Filed Weights   06/25/14 2140  Weight: 70.761 kg (156 lb)    Exam:   General:  Awake, in nad  Cardiovascular: regular, s1, s2  Respiratory: decreased breath sounds, end-expiratory wheezing B  Abdomen: soft, nondistended  Musculoskeletal: perfused, no clubbing   Data Reviewed: Basic Metabolic Panel:  Recent Labs Lab 06/25/14 2306 06/27/14 0705 06/28/14 0541  NA 136 141 140  K 3.4* 5.0 4.8  CL 103 109 105  CO2 24 27 29   GLUCOSE 97 141* 120*  BUN 13 12 14   CREATININE 0.72 0.52 0.58  CALCIUM 8.7 8.8  8.6   Liver Function Tests:  Recent Labs Lab 06/25/14 2306 06/27/14 0705  AST 20 22  ALT 11 12  ALKPHOS 162* 140*  BILITOT 0.4 0.1*  PROT 7.3 6.2  ALBUMIN 4.0 3.1*   No results for input(s): LIPASE, AMYLASE in the last 168 hours. No results for input(s): AMMONIA in the last 168 hours. CBC:  Recent Labs Lab 06/25/14 2306 06/27/14 0705 06/28/14 0541  WBC 4.9 5.6 7.1  NEUTROABS 3.6  --   --   HGB 14.9 12.5 12.6  HCT 42.2 36.9 37.0  MCV 84.6 85.0 85.3  PLT 166 159 174   Cardiac Enzymes:  Recent Labs Lab 06/25/14 2306 06/28/14 0618 06/28/14 1244  TROPONINI <0.03 <0.03 <0.03   BNP (last 3 results)  Recent Labs  06/25/14 2306  BNP 12.0    ProBNP (last 3 results) No results for input(s): PROBNP in the last 8760 hours.  CBG: No results for input(s): GLUCAP in the last 168 hours.  No results found for this or any previous visit (from the past 240 hour(s)).   Studies: No results found.  Scheduled Meds: . aspirin EC  81 mg Oral Daily  . DULoxetine  30 mg Oral BID  . enoxaparin (LOVENOX) injection  40 mg Subcutaneous Q24H  . feeding supplement (ENSURE COMPLETE)  237 mL Oral TID BM  . fluticasone  1 puff Inhalation BID  .  gabapentin  300 mg Oral TID  . guaiFENesin  1,200 mg Oral BID  . ipratropium-albuterol  3 mL Nebulization Q6H  . isosorbide mononitrate  60 mg Oral Daily  . [START ON 06/29/2014] levofloxacin  750 mg Oral Daily  . methylPREDNISolone (SOLU-MEDROL) injection  60 mg Intravenous Q6H  . mometasone-formoterol  2 puff Inhalation BID  . nicotine  14 mg Transdermal Daily  . oseltamivir  75 mg Oral BID  . pantoprazole  40 mg Oral Daily   Continuous Infusions:   Principal Problem:   COPD exacerbation Active Problems:   HTN (hypertension)   Hypoxia   CAP (community acquired pneumonia)   Influenza with pneumonia   Courtney Zuniga, [facility] Pager [phone]. If 7PM-7AM, please contact night-coverage at [email or web address], password  [facility] 06/28/2014, 4:01 PM  LOS: 2 days

## 2014-06-28 NOTE — Progress Notes (Signed)
Pt c/o chest pain this am on left side of chest, radiating to left arm. Pt requests a nitro tablet, which is what she does at home for the pain. Notified Dr Darrick Meigs, who ordered STAT EKG, Cycled Troponins and Morphine 2mg  x1 dose. Made Respiratory and Lab aware of new orders. Continuing to monitor pt. Administering Morphine at this time.

## 2014-06-29 LAB — BASIC METABOLIC PANEL
ANION GAP: 7 (ref 5–15)
BUN: 14 mg/dL (ref 6–23)
CO2: 29 mmol/L (ref 19–32)
CREATININE: 0.54 mg/dL (ref 0.50–1.10)
Calcium: 9 mg/dL (ref 8.4–10.5)
Chloride: 102 mmol/L (ref 96–112)
GFR calc Af Amer: >90 mL/min (ref >90)
GFR calc non Af Amer: >90 mL/min (ref >90)
GLUCOSE: 121 mg/dL — AB (ref 70–99)
POTASSIUM: 4.1 mmol/L (ref 3.5–5.1)
Sodium: 138 mmol/L (ref 135–145)

## 2014-06-29 LAB — CBC
HCT: 37.7 % (ref 36.0–46.0)
Hemoglobin: 13.1 g/dL (ref 12.0–15.0)
MCH: 29.2 pg (ref 26.0–34.0)
MCHC: 34.7 g/dL (ref 30.0–36.0)
MCV: 84.2 fL (ref 78.0–100.0)
Platelets: 186 10*3/uL (ref 150–400)
RBC: 4.48 MIL/uL (ref 3.87–5.11)
RDW: 14.4 % (ref 11.5–15.5)
WBC: 5.8 10*3/uL (ref 4.0–10.5)

## 2014-06-29 LAB — TROPONIN I
Troponin I: <0.03 ng/mL (ref <0.031)
Troponin I: <0.03 ng/mL (ref <0.031)

## 2014-06-29 NOTE — Progress Notes (Signed)
Oxygen saturation 95% on room air. Oxygen saturation lowest 90% room air while ambulating in hallway.

## 2014-06-29 NOTE — Progress Notes (Signed)
Discharge instruction reviewed with patient. IV removed. No distress noted. Prescriptions given to patient. Taken to lobby via wheelchair.

## 2014-06-29 NOTE — Discharge Summary (Signed)
Physician Discharge Summary  Courtney Zuniga [id number] DOB: 1950/06/24 DOA: 06/25/2014  PCP: Petra Kuba, MD  Admit date: 06/25/2014 Discharge date: 06/29/2014  Time spent: 20 minutes  Recommendations for Outpatient Follow-up:  Follow up with PCP in 1-2 weeks  Discharge Diagnoses:  Principal Problem:   COPD exacerbation Active Problems:   HTN (hypertension)   Hypoxia   CAP (community acquired pneumonia)   Influenza with pneumonia   Discharge Condition: Improved  Diet recommendation: Heart healthy  Filed Weights   06/25/14 2140  Weight: 70.761 kg (156 lb)    History of present illness:  Please review h and p from 3/14 for details. Briefly, pt presented with acute on chronic resp failure, found to be flu positive. Pt was admitted for further work up.  Hospital Course:   Acute on chronic respiratory failure. Patient is chronically on oxygen at night. She had been becoming increasingly hypoxic and required it during the day. This is likely multifactorial related to pneumonia, influenza and COPD. Patient initially remained O2 dependent with O2 requirements improving with steroids and bronchodilator tx. On the day of d/c, the patient was able to ambulate on RA with lowest recorded O2 sat of 90%  Community-acquired pneumonia. Patient was on Rocephin and azithromycin. She remained afebrile. Had transitioned to PO levaquin to complete course  Influenza A. Continued on Tamiflu. Continue droplet precautions  COPD exacerbation. Improved per above  Dehydration. Improved with IV fluids  Consultations:  none  Discharge Exam: Filed Vitals:   06/28/14 2002 06/28/14 2122 06/29/14 0530 06/29/14 0708  BP:  160/92 162/99   Pulse:  86 95   Temp:  98.5 F (36.9 C) 98.5 F (36.9 C)   TempSrc:  Oral Oral   Resp:  20 20   Height:      Weight:      SpO2: 96% 98% 97% 98%    General: awake, in nad Cardiovascular: regular, s1, s2 Respiratory: normal resp effort, no  wheezing  Discharge Instructions     Medication List    STOP taking these medications        methylPREDNISolone 4 MG tablet  Commonly known as:  MEDROL DOSEPAK      TAKE these medications        albuterol (2.5 MG/3ML) 0.083% nebulizer solution  Commonly known as:  PROVENTIL  Take 2.5 mg by nebulization every 6 (six) hours as needed for wheezing or shortness of breath.     albuterol 108 (90 BASE) MCG/ACT inhaler  Commonly known as:  PROVENTIL HFA;VENTOLIN HFA  Inhale 2 puffs into the lungs every 6 (six) hours as needed. Shortness of Breath     aspirin EC 81 MG tablet  Take 81 mg by mouth daily.     Aspirin-Acetaminophen-Caffeine 260-130-16 MG Tabs  Take 1 packet by mouth every 4 (four) hours as needed (Pain).     calcium-vitamin D 250-100 MG-UNIT per tablet  Take 1 tablet by mouth daily.     DULoxetine 30 MG capsule  Commonly known as:  CYMBALTA  Take 30 mg by mouth 2 (two) times daily.     Fluticasone-Salmeterol 500-50 MCG/DOSE Aepb  Commonly known as:  ADVAIR  Inhale 1 puff into the lungs every 12 (twelve) hours.     gabapentin 300 MG capsule  Commonly known as:  NEURONTIN  Take 300 mg by mouth 3 (three) times daily.     HYDROcodone-acetaminophen 5-325 MG per tablet  Commonly known as:  NORCO/VICODIN  Take 2 tablets by mouth every 4 (  four) hours as needed.     isosorbide mononitrate 60 MG 24 hr tablet  Commonly known as:  IMDUR  Take 60 mg by mouth daily.     levofloxacin 750 MG tablet  Commonly known as:  LEVAQUIN  Take 1 tablet (750 mg total) by mouth daily.     naproxen 500 MG tablet  Commonly known as:  NAPROSYN  Take 1 tablet (500 mg total) by mouth 2 (two) times daily.     NITROSTAT 0.4 MG SL tablet  Generic drug:  nitroGLYCERIN  Place 0.4 mg under the tongue every 5 (five) minutes as needed for chest pain.     oseltamivir 75 MG capsule  Commonly known as:  TAMIFLU  Take 1 capsule (75 mg total) by mouth 2 (two) times daily.     pantoprazole  40 MG tablet  Commonly known as:  PROTONIX  Take 1 tablet (40 mg total) by mouth daily.     predniSONE 5 MG tablet  Commonly known as:  DELTASONE  Take 1 tablet (5 mg total) by mouth daily with breakfast.     QVAR 80 MCG/ACT inhaler  Generic drug:  beclomethasone  Inhale 1 puff into the lungs 2 (two) times daily.       No Known Allergies Follow-up Information    Follow up with Sena Hitch On 07/06/2014.   Why:  hospital follow up  at 3:00 pm   Contact information:   [phone]       The results of significant diagnostics from this hospitalization (including imaging, microbiology, ancillary and laboratory) are listed below for reference.    Significant Diagnostic Studies: Dg Chest Portable 1 View  06/26/2014   CLINICAL DATA:  Acute onset of shortness of breath and generalized body aches. Wheezing, dry cough, chills, headache, mild sore throat and rhinorrhea. Initial encounter.  EXAM: PORTABLE CHEST - 1 VIEW  COMPARISON:  Chest radiograph performed 03/31/2014  FINDINGS: The lungs are hyperexpanded, with flattening of the hemidiaphragms, compatible with COPD. Increased interstitial markings are seen. Underlying vascular congestion is noted. This may reflect mild interstitial edema or possibly pneumonia. There is no evidence of pleural effusion or pneumothorax. Bilateral nipple shadows are noted.  The cardiomediastinal silhouette is within normal limits. No acute osseous abnormalities are seen.  IMPRESSION: 1. Increased interstitial markings and underlying vascular congestion. This may reflect mild interstitial edema or possibly pneumonia. 2. Findings of COPD.   Electronically Signed   By: Garald Balding M.D.   On: 06/26/2014 00:01    Microbiology: No results found for this or any previous visit (from the past 240 hour(s)).   Labs: Basic Metabolic Panel:  Recent Labs Lab 06/25/14 2306 06/27/14 0705 06/28/14 0541 06/29/14 0550  NA 136 141 140 138  K 3.4* 5.0 4.8 4.1  CL  103 109 105 102  CO2 24 27 29 29   GLUCOSE 97 141* 120* 121*  BUN 13 12 14 14   CREATININE 0.72 0.52 0.58 0.54  CALCIUM 8.7 8.8 8.6 9.0   Liver Function Tests:  Recent Labs Lab 06/25/14 2306 06/27/14 0705  AST 20 22  ALT 11 12  ALKPHOS 162* 140*  BILITOT 0.4 0.1*  PROT 7.3 6.2  ALBUMIN 4.0 3.1*   No results for input(s): LIPASE, AMYLASE in the last 168 hours. No results for input(s): AMMONIA in the last 168 hours. CBC:  Recent Labs Lab 06/25/14 2306 06/27/14 0705 06/28/14 0541 06/29/14 0550  WBC 4.9 5.6 7.1 5.8  NEUTROABS 3.6  --   --   --  HGB 14.9 12.5 12.6 13.1  HCT 42.2 36.9 37.0 37.7  MCV 84.6 85.0 85.3 84.2  PLT 166 159 174 186   Cardiac Enzymes:  Recent Labs Lab 06/28/14 0618 06/28/14 1244 06/28/14 1754 06/29/14 0030 06/29/14 0550  TROPONINI <0.03 <0.03 <0.03 <0.03 <0.03   BNP: BNP (last 3 results)  Recent Labs  06/25/14 2306  BNP 12.0    ProBNP (last 3 results) No results for input(s): PROBNP in the last 8760 hours.  CBG: No results for input(s): GLUCAP in the last 168 hours.  Signed:  CHIU, STEPHEN K  [facility] 06/29/2014, 4:10 PM

## 2014-06-29 NOTE — Care Management Utilization Note (Signed)
UR completed 

## 2014-06-29 NOTE — Progress Notes (Signed)
Resting oxygen saturation room air 94%. Patient ambulated in hall with ed tech room air oxygen saturation 88% while ambulating.

## 2014-07-06 ENCOUNTER — Ambulatory Visit: Payer: Medicare Other | Admitting: Orthopedic Surgery

## 2014-07-06 ENCOUNTER — Encounter: Payer: Self-pay | Admitting: Orthopedic Surgery

## 2014-07-27 ENCOUNTER — Encounter: Payer: Self-pay | Admitting: Orthopedic Surgery

## 2014-07-27 ENCOUNTER — Ambulatory Visit (INDEPENDENT_AMBULATORY_CARE_PROVIDER_SITE_OTHER): Payer: Medicare Other | Admitting: Orthopedic Surgery

## 2014-07-27 VITALS — BP 125/94 | Ht 66.0 in | Wt 156.0 lb

## 2014-07-27 DIAGNOSIS — M7662 Achilles tendinitis, left leg: Secondary | ICD-10-CM | POA: Diagnosis not present

## 2014-07-27 MED ORDER — DICLOFENAC POTASSIUM 50 MG PO TABS: 50.0000 mg | ORAL_TABLET | Freq: Two times a day (BID) | ORAL | Status: DC

## 2014-07-27 NOTE — Progress Notes (Signed)
Follow-up left Achilles tendinitis  The patient was treated with a brace using a Cam Walker but had to go to the hospital and then didn't wear the brace for her. They are and then her pain came back review of systems no new trauma no new evidence of fever or chills  She is awake alert and oriented 3 mood and affect are normal vital signs are stable. Tenderness in the Achilles no swelling painful passive range of motion which is normal in dorsiflexion plantar flexion ankle remain stable motor exam intact Achilles tendon no evidence of tear skin intact no rash  Injection retrocalcaneal bursa  Continue Cam Walker  Start anti-inflammatories  No narcotics  Follow-up 6 weeks  The patient consented for injection of her left Achilles tendon area. Timeout was taken to confirm and then we injected Depo-Medrol 40 mg and 2 mL 1% lidocaine. This was done sterilely. Ethyl chloride was used to anesthetize the skin

## 2014-08-22 ENCOUNTER — Other Ambulatory Visit (HOSPITAL_COMMUNITY): Payer: Self-pay | Admitting: Family Medicine

## 2014-08-22 DIAGNOSIS — Z1231 Encounter for screening mammogram for malignant neoplasm of breast: Secondary | ICD-10-CM

## 2014-09-04 ENCOUNTER — Ambulatory Visit (HOSPITAL_COMMUNITY): Payer: Medicare Other

## 2014-09-07 ENCOUNTER — Encounter: Payer: Self-pay | Admitting: Orthopedic Surgery

## 2014-09-07 ENCOUNTER — Ambulatory Visit: Payer: Medicare Other | Admitting: Orthopedic Surgery

## 2014-11-16 ENCOUNTER — Emergency Department (HOSPITAL_COMMUNITY): Payer: Medicare HMO

## 2014-11-16 ENCOUNTER — Emergency Department (HOSPITAL_COMMUNITY)
Admission: EM | Admit: 2014-11-16 | Discharge: 2014-11-16 | Disposition: A | Discharge status: Home / Self Care | Payer: Medicare HMO | Attending: Emergency Medicine | Admitting: Emergency Medicine

## 2014-11-16 ENCOUNTER — Encounter (HOSPITAL_COMMUNITY): Payer: Self-pay | Admitting: Emergency Medicine

## 2014-11-16 DIAGNOSIS — Z79899 Other long term (current) drug therapy: Secondary | ICD-10-CM | POA: Diagnosis not present

## 2014-11-16 DIAGNOSIS — I10 Essential (primary) hypertension: Secondary | ICD-10-CM | POA: Diagnosis not present

## 2014-11-16 DIAGNOSIS — Z7952 Long term (current) use of systemic steroids: Secondary | ICD-10-CM | POA: Diagnosis not present

## 2014-11-16 DIAGNOSIS — R0602 Shortness of breath: Secondary | ICD-10-CM | POA: Diagnosis present

## 2014-11-16 DIAGNOSIS — Z720 Tobacco use: Secondary | ICD-10-CM | POA: Insufficient documentation

## 2014-11-16 DIAGNOSIS — J441 Chronic obstructive pulmonary disease with (acute) exacerbation: Secondary | ICD-10-CM | POA: Diagnosis not present

## 2014-11-16 DIAGNOSIS — I2510 Atherosclerotic heart disease of native coronary artery without angina pectoris: Secondary | ICD-10-CM | POA: Diagnosis not present

## 2014-11-16 DIAGNOSIS — Z9981 Dependence on supplemental oxygen: Secondary | ICD-10-CM | POA: Diagnosis not present

## 2014-11-16 DIAGNOSIS — I252 Old myocardial infarction: Secondary | ICD-10-CM | POA: Diagnosis not present

## 2014-11-16 DIAGNOSIS — Z86011 Personal history of benign neoplasm of the brain: Secondary | ICD-10-CM | POA: Insufficient documentation

## 2014-11-16 DIAGNOSIS — Z792 Long term (current) use of antibiotics: Secondary | ICD-10-CM | POA: Diagnosis not present

## 2014-11-16 DIAGNOSIS — Z7982 Long term (current) use of aspirin: Secondary | ICD-10-CM | POA: Diagnosis not present

## 2014-11-16 DIAGNOSIS — G8929 Other chronic pain: Secondary | ICD-10-CM | POA: Insufficient documentation

## 2014-11-16 HISTORY — DX: Chronic obstructive pulmonary disease, unspecified: J44.9

## 2014-11-16 HISTORY — DX: Dependence on supplemental oxygen: Z99.81

## 2014-11-16 LAB — CBC WITH DIFFERENTIAL/PLATELET
BASOS ABS: 0 10*3/uL (ref 0.0–0.1)
Basophils Relative: 0 % (ref 0–1)
EOS ABS: 0.1 10*3/uL (ref 0.0–0.7)
Eosinophils Relative: 2 % (ref 0–5)
HCT: 42.5 % (ref 36.0–46.0)
Hemoglobin: 15.1 g/dL — ABNORMAL HIGH (ref 12.0–15.0)
LYMPHS PCT: 47 % — AB (ref 12–46)
Lymphs Abs: 2.7 10*3/uL (ref 0.7–4.0)
MCH: 30 pg (ref 26.0–34.0)
MCHC: 35.5 g/dL (ref 30.0–36.0)
MCV: 84.3 fL (ref 78.0–100.0)
MONO ABS: 0.4 10*3/uL (ref 0.1–1.0)
Monocytes Relative: 6 % (ref 3–12)
NEUTROS ABS: 2.6 10*3/uL (ref 1.7–7.7)
Neutrophils Relative %: 45 % (ref 43–77)
Platelets: 191 10*3/uL (ref 150–400)
RBC: 5.04 MIL/uL (ref 3.87–5.11)
RDW: 14.2 % (ref 11.5–15.5)
WBC: 5.7 10*3/uL (ref 4.0–10.5)

## 2014-11-16 LAB — BASIC METABOLIC PANEL
Anion gap: 8 (ref 5–15)
BUN: 9 mg/dL (ref 6–20)
CHLORIDE: 108 mmol/L (ref 101–111)
CO2: 25 mmol/L (ref 22–32)
Calcium: 9.3 mg/dL (ref 8.9–10.3)
Creatinine, Ser: 0.61 mg/dL (ref 0.44–1.00)
GFR calc non Af Amer: >60 mL/min (ref >60)
Glucose, Bld: 76 mg/dL (ref 65–99)
POTASSIUM: 3.9 mmol/L (ref 3.5–5.1)
Sodium: 141 mmol/L (ref 135–145)

## 2014-11-16 LAB — TROPONIN I

## 2014-11-16 MED ORDER — ALBUTEROL SULFATE HFA 108 (90 BASE) MCG/ACT IN AERS
2.0000 | INHALATION_SPRAY | RESPIRATORY_TRACT | Status: AC
  Administered 2014-11-16: 2 via RESPIRATORY_TRACT
  Filled 2014-11-16: qty 6.7

## 2014-11-16 MED ORDER — AZITHROMYCIN 250 MG PO TABS: ORAL_TABLET | ORAL | Status: DC

## 2014-11-16 MED ORDER — ALBUTEROL (5 MG/ML) CONTINUOUS INHALATION SOLN
10.0000 mg/h | INHALATION_SOLUTION | Freq: Once | RESPIRATORY_TRACT | Status: AC
  Administered 2014-11-16: 10 mg/h via RESPIRATORY_TRACT
  Filled 2014-11-16: qty 20

## 2014-11-16 MED ORDER — PREDNISONE 20 MG PO TABS: 40.0000 mg | ORAL_TABLET | Freq: Every day | ORAL | Status: DC

## 2014-11-16 MED ORDER — IPRATROPIUM BROMIDE 0.02 % IN SOLN
1.0000 mg | Freq: Once | RESPIRATORY_TRACT | Status: AC
  Administered 2014-11-16: 1 mg via RESPIRATORY_TRACT
  Filled 2014-11-16: qty 5

## 2014-11-16 MED ORDER — METHYLPREDNISOLONE SODIUM SUCC 125 MG IJ SOLR
125.0000 mg | Freq: Once | INTRAMUSCULAR | Status: AC
  Administered 2014-11-16: 125 mg via INTRAVENOUS
  Filled 2014-11-16: qty 2

## 2014-11-16 NOTE — ED Provider Notes (Signed)
CSN: [id number]     Arrival date & time 11/16/14  1200 History   First MD Initiated Contact with Patient 11/16/14 1222     Chief Complaint  Patient presents with  . Shortness of Breath      HPI Pt was seen at 1230.  Per pt, c/o gradual onset and worsening of persistent cough, wheezing and SOB for the past 1 week.  Describes her cough as productive of "yellow" sputum.  Has been using home O2 N/C, MDI and nebs without relief. Pt continues to smoke cigarettes. Denies CP/palpitations, no back pain, no abd pain, no N/V/D, no fevers, no rash.     Past Medical History  Diagnosis Date  . Brain tumor (benign) 2005 [facility]    Benign  . Hypertension   . Asthma   . Chronic headaches   . Chronic hip pain   . Chronic pain   . NSTEMI (non-ST elevated myocardial infarction)   . Coronary artery disease   . On home O2     qhs  . COPD (chronic obstructive pulmonary disease)    Past Surgical History  Procedure Laterality Date  . Cholecystectomy    . Tumor removal      Benign   Family History  Problem Relation Age of Onset  . Cancer Mother   . Cancer Father   . Cancer Sister    History  Substance Use Topics  . Smoking status: Current Every Day Smoker -- 0.33 packs/day    Types: Cigarettes  . Smokeless tobacco: Not on file  . Alcohol Use: No    Review of Systems ROS: Statement: All systems negative except as marked or noted in the HPI; Constitutional: Negative for fever and chills. ; ; Eyes: Negative for eye pain, redness and discharge. ; ; ENMT: Negative for ear pain, hoarseness, nasal congestion, sinus pressure and sore throat. ; ; Cardiovascular: Negative for chest pain, palpitations, diaphoresis, and peripheral edema. ; ; Respiratory: +SOB, cough, wheezing. Negative for stridor. ; ; Gastrointestinal: Negative for nausea, vomiting, diarrhea, abdominal pain, blood in stool, hematemesis, jaundice and rectal bleeding. . ; ; Genitourinary: Negative for dysuria, flank pain and hematuria. ; ;  Musculoskeletal: Negative for back pain and neck pain. Negative for swelling and trauma.; ; Skin: Negative for pruritus, rash, abrasions, blisters, bruising and skin lesion.; ; Neuro: Negative for headache, lightheadedness and neck stiffness. Negative for weakness, altered level of consciousness , altered mental status, extremity weakness, paresthesias, involuntary movement, seizure and syncope.      Allergies  Review of patient's allergies indicates no known allergies.  Home Medications   Prior to Admission medications   Medication Sig Start Date End Date Taking? Authorizing Provider  albuterol (PROVENTIL HFA;VENTOLIN HFA) 108 (90 BASE) MCG/ACT inhaler Inhale 2 puffs into the lungs every 6 (six) hours as needed. Shortness of Breath    Historical Provider, MD  albuterol (PROVENTIL) (2.5 MG/3ML) 0.083% nebulizer solution Take 2.5 mg by nebulization every 6 (six) hours as needed for wheezing or shortness of breath.    Historical Provider, MD  aspirin EC 81 MG tablet Take 81 mg by mouth daily.    Historical Provider, MD  Aspirin-Acetaminophen-Caffeine 260-130-16 MG TABS Take 1 packet by mouth every 4 (four) hours as needed (Pain).    Historical Provider, MD  calcium-vitamin D 250-100 MG-UNIT per tablet Take 1 tablet by mouth daily.    Historical Provider, MD  diclofenac (CATAFLAM) 50 MG tablet Take 1 tablet (50 mg total) by mouth 2 (two) times  daily. 07/27/14   Carole Civil, MD  DULoxetine (CYMBALTA) 30 MG capsule Take 30 mg by mouth 2 (two) times daily.  09/22/13   Historical Provider, MD  Fluticasone-Salmeterol (ADVAIR) 500-50 MCG/DOSE AEPB Inhale 1 puff into the lungs every 12 (twelve) hours.    Historical Provider, MD  gabapentin (NEURONTIN) 300 MG capsule Take 300 mg by mouth 3 (three) times daily.     Historical Provider, MD  HYDROcodone-acetaminophen (NORCO/VICODIN) 5-325 MG per tablet Take 2 tablets by mouth every 4 (four) hours as needed. Patient not taking: Reported on 06/25/2014  05/03/14   Tanna Furry, MD  isosorbide mononitrate (IMDUR) 60 MG 24 hr tablet Take 60 mg by mouth daily.  09/22/13   Historical Provider, MD  levofloxacin (LEVAQUIN) 750 MG tablet Take 1 tablet (750 mg total) by mouth daily. 06/29/14   Donne Hazel, MD  naproxen (NAPROSYN) 500 MG tablet Take 1 tablet (500 mg total) by mouth 2 (two) times daily. Patient taking differently: Take 500 mg by mouth 2 (two) times daily as needed for mild pain.  05/03/14   Tanna Furry, MD  nitroGLYCERIN (NITROSTAT) 0.4 MG SL tablet Place 0.4 mg under the tongue every 5 (five) minutes as needed for chest pain.  02/15/13   Historical Provider, MD  oseltamivir (TAMIFLU) 75 MG capsule Take 1 capsule (75 mg total) by mouth 2 (two) times daily. 06/28/14   Donne Hazel, MD  pantoprazole (PROTONIX) 40 MG tablet Take 1 tablet (40 mg total) by mouth daily. Patient not taking: Reported on 05/03/2014 02/17/14   Kathie Dike, MD  predniSONE (DELTASONE) 5 MG tablet Take 1 tablet (5 mg total) by mouth daily with breakfast. 06/28/14   Donne Hazel, MD  QVAR 80 MCG/ACT inhaler Inhale 1 puff into the lungs 2 (two) times daily.  02/13/14   Historical Provider, MD   BP 132/84 mmHg  Pulse 100  Temp(Src) 98.4 F (36.9 C) (Oral)  Resp 20  Ht '5\' 7"'$  (1.702 m)  Wt 158 lb (71.668 kg)  BMI 24.74 kg/m2  SpO2 99% Physical Exam  1235: Physical examination:  Nursing notes reviewed; Vital signs and O2 SAT reviewed;  Constitutional: Well developed, Well nourished, Well hydrated, Uncomfortable appearing.; Head:  Normocephalic, atraumatic; Eyes: EOMI, PERRL, No scleral icterus; ENMT: Mouth and pharynx normal, Mucous membranes moist; Neck: Supple, Full range of motion, No lymphadenopathy; Cardiovascular: Regular rate and rhythm, No gallop; Respiratory: Breath sounds diminished & equal bilaterally, faint wheezes. No audible wheezing. Speaking short sentences, tachypneic, sitting upright.; Chest: Nontender, Movement normal; Abdomen: Soft, Nontender,  Nondistended, Normal bowel sounds; Genitourinary: No CVA tenderness; Extremities: Pulses normal, No tenderness, No edema, No calf edema or asymmetry.; Neuro: AA&Ox3, Major CN grossly intact.  Speech clear. No gross focal motor or sensory deficits in extremities.; Skin: Color normal, Warm, Dry.   ED Course  Procedures     EKG Interpretation None      MDM  MDM Reviewed: previous chart, nursing note and vitals Reviewed previous: labs, x-ray and ECG Interpretation: labs, x-ray and ECG   ED ECG REPORT   Date: 11/16/2014  Rate: 89  Rhythm: normal sinus rhythm, artifact, baseline wander  QRS Axis: normal  Intervals: normal  ST/T Wave abnormalities: normal  Conduction Disutrbances:none  Narrative Interpretation:   Old EKG Reviewed: unchanged; no significant change compared to previous EKG dated 03/31/2014.  Results for orders placed or performed during the hospital encounter of 21/30/86  Basic metabolic panel  Result Value Ref Range   Sodium  141 135 - 145 mmol/L   Potassium 3.9 3.5 - 5.1 mmol/L   Chloride 108 101 - 111 mmol/L   CO2 25 22 - 32 mmol/L   Glucose, Bld 76 65 - 99 mg/dL   BUN 9 6 - 20 mg/dL   Creatinine, Ser 0.61 0.44 - 1.00 mg/dL   Calcium 9.3 8.9 - 10.3 mg/dL   GFR calc non Af Amer >60 >60 mL/min   GFR calc Af Amer >60 >60 mL/min   Anion gap 8 5 - 15  Troponin I  Result Value Ref Range   Troponin I <0.03 <0.031 ng/mL  CBC with Differential  Result Value Ref Range   WBC 5.7 4.0 - 10.5 K/uL   RBC 5.04 3.87 - 5.11 MIL/uL   Hemoglobin 15.1 (H) 12.0 - 15.0 g/dL   HCT 42.5 36.0 - 46.0 %   MCV 84.3 78.0 - 100.0 fL   MCH 30.0 26.0 - 34.0 pg   MCHC 35.5 30.0 - 36.0 g/dL   RDW 14.2 11.5 - 15.5 %   Platelets 191 150 - 400 K/uL   Neutrophils Relative % 45 43 - 77 %   Neutro Abs 2.6 1.7 - 7.7 K/uL   Lymphocytes Relative 47 (H) 12 - 46 %   Lymphs Abs 2.7 0.7 - 4.0 K/uL   Monocytes Relative 6 3 - 12 %   Monocytes Absolute 0.4 0.1 - 1.0 K/uL   Eosinophils  Relative 2 0 - 5 %   Eosinophils Absolute 0.1 0.0 - 0.7 K/uL   Basophils Relative 0 0 - 1 %   Basophils Absolute 0.0 0.0 - 0.1 K/uL   Dg Chest Port 1 View 11/16/2014   CLINICAL DATA:  Shortness of breath and productive cough for 1 week. History of asthma.  EXAM: PORTABLE CHEST - 1 VIEW  COMPARISON:  06/25/2014  FINDINGS: The cardiomediastinal silhouette is unchanged and within normal limits. Thoracic aortic calcification is noted. The lungs remain hyperinflated with chronic coarsening of the interstitial markings, overall similar to the prior study. Scarring is present in both lung bases. No confluent airspace opacity, pleural effusion, or pneumothorax is identified. No acute osseous abnormality is seen.  IMPRESSION: COPD.  No evidence of acute airspace disease.   Electronically Signed   By: Logan Bores   On: 11/16/2014 13:12    1530:  Pt states she "feels better" after neb and steroid.  NAD, lungs CTA bilat, no wheezing, resps easy, speaking full sentences, Sats 95% R/A.  Pt ambulated around the ED with Sats remaining 94-96 % R/A, resps easy, NAD.  Pt states she wants to go home and does not want to be admitted to the hospital. States she already has enough neb solution at home and does not need another rx. Dx and testing d/w pt and family.  Questions answered.  Verb understanding, agreeable to d/c home with outpt f/u.           Francine Graven, DO 11/18/14 2335

## 2014-11-16 NOTE — ED Notes (Signed)
MD Thurnell Garbe at bedside updating patient and family.

## 2014-11-16 NOTE — ED Notes (Signed)
Respiratory paged for pt's breathing treatments.

## 2014-11-16 NOTE — ED Notes (Signed)
Ambulated pt in the hallway; O2 sat. Stayed between 94 and 96%.

## 2014-11-16 NOTE — ED Notes (Signed)
Respiratory at bedside.

## 2014-11-16 NOTE — ED Notes (Signed)
Pt has been sob x 1 week, using inhaler and breathing treatments without relief.

## 2014-11-16 NOTE — Discharge Instructions (Signed)
°Emergency Department Resource Guide °1) Find a Doctor and Pay Out of Pocket °Although you won't have to find out who is covered by your insurance plan, it is a good idea to ask around and get recommendations. You will then need to call the office and see if the doctor you have chosen will accept you as a new patient and what types of options they offer for patients who are self-pay. Some doctors offer discounts or will set up payment plans for their patients who do not have insurance, but you will need to ask so you aren't surprised when you get to your appointment. ° °2) Contact Your Local Health Department °Not all health departments have doctors that can see patients for sick visits, but many do, so it is worth a call to see if yours does. If you don't know where your local health department is, you can check in your phone book. The CDC also has a tool to help you locate your state's health department, and many state websites also have listings of all of their local health departments. ° °3) Find a Walk-in Clinic °If your illness is not likely to be very severe or complicated, you may want to try a walk in clinic. These are popping up all over the country in pharmacies, drugstores, and shopping centers. They're usually staffed by nurse practitioners or physician assistants that have been trained to treat common illnesses and complaints. They're usually fairly quick and inexpensive. However, if you have serious medical issues or chronic medical problems, these are probably not your best option. ° °No Primary Care Doctor: °- Call [facility] at  832-8000 - they can help you locate a primary care doctor that  accepts your insurance, provides certain services, etc. °- Physician Referral Service- 1-800-533-3463 ° °Chronic Pain Problems: °Organization         Address  Phone   Notes  °[facility]  ([phone]) [phone] Patients need to be referred by their primary care doctor.  ° °Medication  Assistance: °Organization         Address  Phone   Notes  °[place] [facility] [address] °[place], [place] [address] (336) 641-8030 --Must be a resident of [place] °-- Must have NO insurance coverage whatsoever (no Medicaid/ Medicare, etc.) °-- The pt. MUST have a primary care doctor that directs their care regularly and follows them in the community °  °[facility]  (866) 331-1348   °[employer]  (888) 892-1162   ° °Agencies that provide inexpensive medical care: °Organization         Address  Phone   Notes  °[facility]  (336) 832-8035   °[facility]    (336) 832-7272   °[facility] [address] °[place], [place] [address] (336) 832-4777   °[facility] [address], °[place] (336) 271-4999   °[facility]    (336) 373-0678   °[facility]    (336) 272-1050   °[facility] ° [address], [place] Phone:  (336) 832-4444, Fax:  (336) 832-4440 Hours of Operation:  9 am - 6 pm, M-F.  Also accepts Medicaid/Medicare and self-pay.  °[facility] ° [address],  Phone: (336) [phone], Fax: (336) [phone]. Hours of Operation:  8:30 am - 5:30 pm, M-F.  Also accepts Medicaid and self-pay.  °[facility] [place] [address], [place] Phone: (336) 878-6027   °[facility] [address], [place], [place] (336)723-1848, Ext. 123 Mondays & Thursdays: 7-9 AM.  First 15 patients are seen on a first come, first serve basis. °  ° °Medicaid-accepting [place] Providers: ° °Organization         Address  Phone   Notes  °[facility] [address], [place] (336) 641-2100 Also accepts self-pay patients.  °[facility] [address], [place] ° (336) 856-9996   °[facility] [address], [place]  (336) 288-8857   °[facility] [address] [place] [address], [place] (336) 299-7000   °Veita Bland [address], [place]  ° (336) 373-1557 Only accepts [place] Access Medicaid patients after they have their name applied to their card.  ° °Self-Pay (no insurance) in [place]: ° °Organization         Address  Phone   Notes  °Sickle Cell Patients, [facility] [address], [place] (336) 832-1970   °[facility] [address], [place] (336) 832-4400   °[facility] [place] ° [address],  (336) 992-4800   °[facility]/Dr. Osei-Bonsu ° [address] [place] [address], [place] or [address], [place] (336) [phone] Phone number for both [place] and [place] locations is the same.  °[facility] [address], [place] (336) 299-0000   °[facility] [address] [place] [address], [place] or [address] (336) 852-7530 °(336) 878-2260   °[facility] [address], [place] (336) 350-1642, phone; (336) 294-5005, fax Sees patients 1st and 3rd Saturday of every month.  Must not qualify for public or private insurance (i.e. Medicaid, Medicare, [employer] Health Choice, Veterans' Benefits) • Household income should be no more than 200% of the poverty level •The clinic cannot treat you if you are pregnant or think you are pregnant • Sexually transmitted diseases are not treated at the clinic.  ° ° °Dental Care: °Organization         Address  Phone  Notes  °[place] [facility] [address], [place] (336) 641-6152 Accepts children up to age 21 who are enrolled in Medicaid or [employer] Health Choice; pregnant women with a Medicaid card; and children who have applied for Medicaid or [employer] Health Choice, but were declined, whose parents can pay a reduced fee at time of service.  °[place]  [facility] [place]  [address], [place] (336) 641-7733 Accepts children up to age 21 who are enrolled in Medicaid or [place] Health Choice; pregnant women with a Medicaid card; and children who have applied for Medicaid or  Health Choice, but were declined, whose parents can pay a reduced fee at time of service.  °[facility] ° [address], [place] (336) 641-4533 Patients are seen by appointment only. Walk-ins are not accepted. [facility] will see patients 18 years of age and older. °Monday - Tuesday (8am-5pm) °Most Wednesdays (8:30-5pm) °$30 per visit, cash only  °[facility] ° [address], [place] (336) 641-4533 Patients are seen by appointment only. Walk-ins are not accepted. [facility] will see patients 18 years of age and older. °One   Wednesday Evening (Monthly: Volunteer Based).  $30 per visit, cash only  °[facility]  (919) 537-3737 for adults; Children under age 4, call Graduate Pediatric Dentistry at ([phone]) [phone]. Children aged 4-14, please call ([phone]) [phone] to request a pediatric application. ° Dental services are provided in all areas of dental care including fillings, crowns and bridges, complete and partial dentures, implants, gum treatment, root canals, and extractions. Preventive care is also provided. Treatment is provided to both adults and children. °Patients are selected via a lottery and there is often a waiting list. °  °[facility] [address], °[place] ° (336) 763-8833 [email or web address] °  °[facility] [address], [place], [place] (336)723-1848, Ext. 123 Second and Fourth Thursday of each month, opens at 6:30 AM; Clinic ends at 9 AM.  Patients are seen on a first-come first-served basis, and a limited number are seen during each clinic.  ° °[facility] ° [address], [place], [place] (336) [phone]    Eligibility Requirements °You must have lived in [place], [place], or [place] counties for at least the last three months. °  You cannot be eligible for state or federal sponsored healthcare insurance, including Veterans Administration, Medicaid, or Medicare. °  You generally cannot be eligible for healthcare insurance through your employer.  °  How to apply: °Eligibility screenings are held every Tuesday and Wednesday afternoon from 1:00 pm until 4:00 pm. You do not need an appointment for the interview!  °[facility] [address], [place], [place] 336-631-2330   °[facility]  336-342-8273   °[facility]  336-703-3100   °[facility]  336-570-6415   ° °Behavioral Health Resources in the Community: °Intensive Outpatient Programs °Organization         Address  Phone  Notes  °[facility] [address], [place], [place] 336-878-6098   °[facility] [address], [place], [place] 336-832-9800   °[facility] [address], [place], [place] ° 336-882-2125   °[facility] [address],  °[place], [place] 1-800-853-5163 or 336-641-4981   °Substance Abuse Resources °Organization         Address  Phone  Notes  °[facility]  336-882-2125   °[facility]  336-784-9470   °[facility]  336-285-9073   °[facility]  336-845-3988   °Residential & Outpatient Substance Abuse Program  1-800-659-3381   °Psychological Services °Organization         Address  Phone  Notes  °[facility]  336- 832-9600   °[facility]  336- 378-7881   °[facility] [address], [place] 1-800-853-5163 or 336-641-4981   ° °Mobile Crisis Teams °Organization         Address  Phone  Notes  °[facility], Mobile Crisis Care Unit  1-877-626-1772   °Assertive °[facility] ° [address]  [place], [place] 336-834-9664   °Sharon DeEsch [address] ° [place] 336-554-5454   ° °Self-Help/Support Groups °Organization         Address  Phone             Notes  °[facility]  - variety of support groups  336- 373-1402 Call for more information  °Narcotics Anonymous (NA), [facility] [address], °[place]   2 meetings at this location  ° °  Residential Treatment Programs Organization         Address  Phone  Notes  [facility] [address],    [place]  1-351-195-2267   [facility]  [address], [place] [address], [place], [place]   [place], [place] (858) [phone] Admissions: 8am-3pm M-F  [facility] [place] [address],    [place], [place] [phone]   [facility] [address] [place], [place], [place]   [facility] [address],  [place], [place]   [facility] Intensive Outpatient [address], [place], [place]   [facility] [place]  [place], [place] 1-364-269-7150 or (270)365-4713   [facility] [address], [place], [place] Accepts Medicaid  [facility] [address],  [place] 1-682-823-0560 Substance Abuse/Addiction Treatment   [facility]         Address  Phone  Notes  [facility]  901-829-7044   Domenic Schwab, PhD [address] [place] [place], [place]   (715)193-5168 or 587-528-1547   [facility] [place], [place] [phone]   [facility] [address] [place], [place] (769)423-5320 Insurance/Medicaid/sponsorship through [facility] [address], [address] [place]                                    [address], [place] [phone]) [phone] [place] [address], [place] [phone]    Dr. Adele Schilder  ([phone]   [facility] Dept. 1) [address], [place] 2) [place] 3)  [address], [place] ([phone]) [phone]  [phone]   [place] [phone] or [phone] (After Hours)      Take the prescriptions as directed.  Use your albuterol inhaler (2 to 4 puffs) or your albuterol nebulizer (1 unit dose) every 4 hours for the next 7 days, then as needed for cough, wheezing, or shortness of breath.  Call your regular medical doctor tomorrow morning to schedule a follow up appointment within the next 2 to 3 days.  Return to the Emergency Department immediately sooner if worsening.

## 2015-01-07 ENCOUNTER — Emergency Department (HOSPITAL_COMMUNITY)
Admission: EM | Admit: 2015-01-07 | Discharge: 2015-01-07 | Disposition: A | Discharge status: Home / Self Care | Payer: Medicare HMO | Attending: Emergency Medicine | Admitting: Emergency Medicine

## 2015-01-07 ENCOUNTER — Encounter (HOSPITAL_COMMUNITY): Payer: Self-pay | Admitting: *Deleted

## 2015-01-07 DIAGNOSIS — Z7952 Long term (current) use of systemic steroids: Secondary | ICD-10-CM | POA: Insufficient documentation

## 2015-01-07 DIAGNOSIS — Z7982 Long term (current) use of aspirin: Secondary | ICD-10-CM | POA: Insufficient documentation

## 2015-01-07 DIAGNOSIS — I252 Old myocardial infarction: Secondary | ICD-10-CM | POA: Diagnosis not present

## 2015-01-07 DIAGNOSIS — M7662 Achilles tendinitis, left leg: Secondary | ICD-10-CM | POA: Insufficient documentation

## 2015-01-07 DIAGNOSIS — Z7951 Long term (current) use of inhaled steroids: Secondary | ICD-10-CM | POA: Diagnosis not present

## 2015-01-07 DIAGNOSIS — Z720 Tobacco use: Secondary | ICD-10-CM | POA: Diagnosis not present

## 2015-01-07 DIAGNOSIS — I2510 Atherosclerotic heart disease of native coronary artery without angina pectoris: Secondary | ICD-10-CM | POA: Diagnosis not present

## 2015-01-07 DIAGNOSIS — J449 Chronic obstructive pulmonary disease, unspecified: Secondary | ICD-10-CM | POA: Diagnosis not present

## 2015-01-07 DIAGNOSIS — I10 Essential (primary) hypertension: Secondary | ICD-10-CM | POA: Diagnosis not present

## 2015-01-07 DIAGNOSIS — Z9981 Dependence on supplemental oxygen: Secondary | ICD-10-CM | POA: Insufficient documentation

## 2015-01-07 DIAGNOSIS — Z86011 Personal history of benign neoplasm of the brain: Secondary | ICD-10-CM | POA: Diagnosis not present

## 2015-01-07 DIAGNOSIS — G8929 Other chronic pain: Secondary | ICD-10-CM | POA: Insufficient documentation

## 2015-01-07 DIAGNOSIS — R000 Tachycardia, unspecified: Secondary | ICD-10-CM | POA: Diagnosis not present

## 2015-01-07 DIAGNOSIS — M79672 Pain in left foot: Secondary | ICD-10-CM | POA: Diagnosis present

## 2015-01-07 DIAGNOSIS — Z79899 Other long term (current) drug therapy: Secondary | ICD-10-CM | POA: Insufficient documentation

## 2015-01-07 MED ORDER — DEXAMETHASONE 4 MG PO TABS: 4.0000 mg | ORAL_TABLET | Freq: Two times a day (BID) | ORAL | Status: DC

## 2015-01-07 MED ORDER — HYDROCODONE-ACETAMINOPHEN 5-325 MG PO TABS: 1.0000 | ORAL_TABLET | ORAL | Status: DC | PRN

## 2015-01-07 MED ORDER — INDOMETHACIN 25 MG PO CAPS
25.0000 mg | ORAL_CAPSULE | Freq: Once | ORAL | Status: AC
  Administered 2015-01-07: 25 mg via ORAL
  Filled 2015-01-07: qty 1

## 2015-01-07 MED ORDER — ONDANSETRON HCL 4 MG PO TABS
4.0000 mg | ORAL_TABLET | Freq: Once | ORAL | Status: AC
  Administered 2015-01-07: 4 mg via ORAL
  Filled 2015-01-07: qty 1

## 2015-01-07 MED ORDER — MELOXICAM 15 MG PO TABS: 15.0000 mg | ORAL_TABLET | Freq: Every day | ORAL | Status: DC

## 2015-01-07 MED ORDER — MORPHINE SULFATE (PF) 4 MG/ML IV SOLN
4.0000 mg | Freq: Once | INTRAVENOUS | Status: AC
  Administered 2015-01-07: 4 mg via INTRAMUSCULAR
  Filled 2015-01-07: qty 1

## 2015-01-07 MED ORDER — DEXAMETHASONE SODIUM PHOSPHATE 4 MG/ML IJ SOLN
8.0000 mg | Freq: Once | INTRAMUSCULAR | Status: AC
  Administered 2015-01-07: 8 mg via INTRAMUSCULAR
  Filled 2015-01-07: qty 2

## 2015-01-07 NOTE — ED Notes (Signed)
Pt dx with achilles tendonitis in April by Dr. Aline Brochure. Pt states pain is to her heel. States Dr. Aline Brochure did a cortisone injection but that it is no longer working. Unable to get in with Dr. Aline Brochure for another 3 months.

## 2015-01-07 NOTE — ED Provider Notes (Signed)
CSN: [id number]     Arrival date & time 01/07/15  1811 History  This chart was scribed for non-physician practitioner, Lily Kocher, PA-C, working with Nat Christen, MD, by Stephania Fragmin, ED Scribe. This patient was seen in room APFT24/APFT24 and the patient's care was started at 6:40PM.    Chief Complaint  Patient presents with  . Foot Pain   Patient is a 64 y.o. female presenting with lower extremity pain. The history is provided by the patient. No language interpreter was used.  Foot Pain This is a chronic problem. The current episode started 12 to 24 hours ago. The problem occurs constantly. Pertinent negatives include no chest pain, no abdominal pain, no headaches and no shortness of breath. Nothing aggravates the symptoms. Nothing relieves the symptoms. Treatments tried: soaks, massage. The treatment provided no relief.    HPI Comments: Courtney Zuniga is a 64 y.o. female who presents to the Emergency Department complaining of acute on chronic exacerbation of severe left heel pain that began last night. She also complains of associated swelling to the affected area. Patient had seen Dr. Aline Brochure for this in April with a diagnosis of ankle tendinitis, and had a cortisone injection, but she states the injection is no longer effective. She had massaged the affected ankle and soaked it in Epsom salts with no relief. She denies any recent changes in activity, including walking, standing, or twisting. She also denies any trauma or injury. Patient ambulates with a left boot and with assistance of a walker.    Past Medical History  Diagnosis Date  . Brain tumor (benign) 2005 [facility]    Benign  . Hypertension   . Asthma   . Chronic headaches   . Chronic hip pain   . Chronic pain   . NSTEMI (non-ST elevated myocardial infarction)   . Coronary artery disease   . On home O2     qhs  . COPD (chronic obstructive pulmonary disease)    Past Surgical History  Procedure Laterality Date  .  Cholecystectomy    . Tumor removal      Benign   Family History  Problem Relation Age of Onset  . Cancer Mother   . Cancer Father   . Cancer Sister    Social History  Substance Use Topics  . Smoking status: Current Every Day Smoker -- 0.33 packs/day    Types: Cigarettes  . Smokeless tobacco: None  . Alcohol Use: No   OB History    No data available     Review of Systems  Respiratory: Negative for shortness of breath.   Cardiovascular: Negative for chest pain.  Gastrointestinal: Negative for abdominal pain.  Musculoskeletal: Positive for joint swelling (left heel swelling) and arthralgias (left heel pain).  Neurological: Negative for headaches.  All other systems reviewed and are negative.     Allergies  Review of patient's allergies indicates no known allergies.  Home Medications   Prior to Admission medications   Medication Sig Start Date End Date Taking? Authorizing Provider  albuterol (PROVENTIL HFA;VENTOLIN HFA) 108 (90 BASE) MCG/ACT inhaler Inhale 2 puffs into the lungs every 6 (six) hours as needed. Shortness of Breath    Historical Provider, MD  albuterol (PROVENTIL) (2.5 MG/3ML) 0.083% nebulizer solution Take 2.5 mg by nebulization every 6 (six) hours as needed for wheezing or shortness of breath.    Historical Provider, MD  aspirin EC 81 MG tablet Take 81 mg by mouth daily.    Historical Provider, MD  Aspirin-Acetaminophen-Caffeine  260-130-16 MG TABS Take 1 packet by mouth every 4 (four) hours as needed (Pain).    Historical Provider, MD  azithromycin (ZITHROMAX) 250 MG tablet Take 2 tablets PO day 1, then 1 tab PO daily x4 days. 11/16/14   Francine Graven, DO  calcium-vitamin D 250-100 MG-UNIT per tablet Take 1 tablet by mouth daily.    Historical Provider, MD  dextromethorphan-guaiFENesin (MUCINEX DM) 30-600 MG per 12 hr tablet Take 1 tablet by mouth 2 (two) times daily as needed (congestion).    Historical Provider, MD  DULoxetine (CYMBALTA) 30 MG capsule Take  30 mg by mouth 2 (two) times daily.  09/22/13   Historical Provider, MD  Fluticasone-Salmeterol (ADVAIR) 500-50 MCG/DOSE AEPB Inhale 1 puff into the lungs every 12 (twelve) hours.    Historical Provider, MD  gabapentin (NEURONTIN) 300 MG capsule Take 300 mg by mouth 3 (three) times daily.     Historical Provider, MD  isosorbide mononitrate (IMDUR) 60 MG 24 hr tablet Take 60 mg by mouth daily.  09/22/13   Historical Provider, MD  naproxen (NAPROSYN) 500 MG tablet Take 1 tablet (500 mg total) by mouth 2 (two) times daily. Patient taking differently: Take 500 mg by mouth 2 (two) times daily as needed for mild pain.  05/03/14   Tanna Furry, MD  nitroGLYCERIN (NITROSTAT) 0.4 MG SL tablet Place 0.4 mg under the tongue every 5 (five) minutes as needed for chest pain.  02/15/13   Historical Provider, MD  predniSONE (DELTASONE) 20 MG tablet Take 2 tablets (40 mg total) by mouth daily. 11/16/14   Francine Graven, DO  QVAR 80 MCG/ACT inhaler Inhale 1 puff into the lungs 2 (two) times daily.  02/13/14   Historical Provider, MD   BP 142/69 mmHg  Pulse 103  Temp(Src) 97.8 F (36.6 C) (Oral)  Resp 24  Ht '5\' 7"'$  (1.702 m)  Wt 150 lb (68.04 kg)  BMI 23.49 kg/m2  SpO2 100% Physical Exam  Constitutional: She is oriented to person, place, and time. She appears well-developed and well-nourished. No distress.  HENT:  Head: Normocephalic and atraumatic.  Eyes: Conjunctivae and EOM are normal.  Neck: Neck supple. No tracheal deviation present.  Cardiovascular: Tachycardia present.   Pulmonary/Chest: Effort normal. No respiratory distress.  Few scattered rhonchi  Musculoskeletal: Normal range of motion.  Good ROM of left knee, with some crepitus present. No calf area tenderness. TTP of the left Achilles'. Pain is worse at the insertion site on the left. The area is not hot. Severe pain with dorsiflexion of left foot and ankle. DP pulse is 2+. Cap refill <2 seconds. There are no lesions between the left toes. No puncture  wounds of the plantar surface of the left foot.   Neurological: She is alert and oriented to person, place, and time.  Skin: Skin is warm and dry.  Psychiatric: She has a normal mood and affect. Her behavior is normal.  Nursing note and vitals reviewed.   ED Course  Procedures (including critical care time)  DIAGNOSTIC STUDIES: Oxygen Saturation is 100% on RA, normal by my interpretation.    COORDINATION OF CARE: 6:46 PM - Discussed treatment plan with pt at bedside which includes IM Decadron, morphine, and ibuprofen. Pt verbalized understanding and agreed to plan.   I have personally reviewed and evaluated patient's images (medical records) as part of my medical decision-making.  MDM  Review of the patient's previous records reveals treatment in the office as well as in the emergency department for Achilles  tendinitis. Patient has history of spurs present. Today's examination suggest Achilles tendinitis. Patient was too uncomfortable to complete examination for plantar fasciitis.  Prescription for meloxicam and Norco given to the patient. Patient is to see her primary physician this week for pain management, and Dr. Aline Brochure for evaluation of the tendinitis.    Final diagnoses:  None    **I personally performed the services described in this documentation, which was scribed in my presence. The recorded information has been reviewed and is accurate.*  I have reviewed nursing notes, vital signs, and all appropriate lab and imaging results for this patient.  deb  Lily Kocher, PA-C 01/07/15 [place], MD 01/07/15 2001

## 2015-01-07 NOTE — Discharge Instructions (Signed)
Please continue to usual boot. Use your walker to stabilize your balance and walking until this issue is resolved. Please see Dr.Selvidge this week concerning your pain management until you can be seen by Dr. Aline Brochure for evaluation and management of your tendinitis. Use meloxicam and Decadron daily with food. Use Norco for pain if needed. Norco may cause drowsiness, please use this medication with caution. Achilles Tendinitis Achilles tendinitis is inflammation of the tough, cord-like band that attaches the lower muscles of your leg to your heel (Achilles tendon). It is usually caused by overusing the tendon and joint involved.  CAUSES Achilles tendinitis can happen because of:  A sudden increase in exercise or activity (such as running).  Doing the same exercises or activities (such as jumping) over and over.  Not warming up calf muscles before exercising.  Exercising in shoes that are worn out or not made for exercise.  Having arthritis or a bone growth on the back of the heel bone. This can rub against the tendon and hurt the tendon. SIGNS AND SYMPTOMS The most common symptoms are:  Pain in the back of the leg, just above the heel. The pain usually gets worse with exercise and better with rest.  Stiffness or soreness in the back of the leg, especially in the morning.  Swelling of the skin over the Achilles tendon.  Trouble standing on tiptoe. Sometimes, an Achilles tendon tears (ruptures). Symptoms of an Achilles tendon rupture can include:  Sudden, severe pain in the back of the leg.  Trouble putting weight on the foot or walking normally. DIAGNOSIS Achilles tendinitis will be diagnosed based on symptoms and a physical examination. An X-ray may be done to check if another condition is causing your symptoms. An MRI may be ordered if your health care provider suspects you may have completely torn your tendon, which is called an Achilles tendon rupture.  TREATMENT  Achilles  tendinitis usually gets better over time. It can take weeks to months to heal completely. Treatment focuses on treating the symptoms and helping the injury heal. HOME CARE INSTRUCTIONS   Rest your Achilles tendon and avoid activities that cause pain.  Apply ice to the injured area:  Put ice in a plastic bag.  Place a towel between your skin and the bag.  Leave the ice on for 20 minutes, 2-3 times a day  Try to avoid using the tendon (other than gentle range of motion) while the tendon is painful. Do not resume use until instructed by your health care provider. Then begin use gradually. Do not increase use to the point of pain. If pain does develop, decrease use and continue the above measures. Gradually increase activities that do not cause discomfort until you achieve normal use.  Do exercises to make your calf muscles stronger and more flexible. Your health care provider or physical therapist can recommend exercises for you to do.  Wrap your ankle with an elastic bandage or other wrap. This can help keep your tendon from moving too much. Your health care provider will show you how to wrap your ankle correctly.  Only take over-the-counter or prescription medicines for pain, discomfort, or fever as directed by your health care provider. SEEK MEDICAL CARE IF:   Your pain and swelling increase or pain is uncontrolled with medicines.  You develop new, unexplained symptoms or your symptoms get worse.  You are unable to move your toes or foot.  You develop warmth and swelling in your foot.  You have  an unexplained temperature. MAKE SURE YOU:   Understand these instructions.  Will watch your condition.  Will get help right away if you are not doing well or get worse. Document Released: 01/08/2005 Document Revised: 01/19/2013 Document Reviewed: 11/10/2012 [facility] Patient Information 2015 [place], [place]. This information is not intended to replace advice given to you by your health  care provider. Make sure you discuss any questions you have with your health care provider.

## 2015-01-07 NOTE — ED Notes (Signed)
Meryl Crutch, PA at bedside for evaluation.

## 2015-01-19 ENCOUNTER — Encounter (HOSPITAL_COMMUNITY): Payer: Self-pay | Admitting: Emergency Medicine

## 2015-01-19 ENCOUNTER — Emergency Department (HOSPITAL_COMMUNITY)
Admission: EM | Admit: 2015-01-19 | Discharge: 2015-01-19 | Disposition: A | Discharge status: Home / Self Care | Payer: Medicare HMO | Attending: Emergency Medicine | Admitting: Emergency Medicine

## 2015-01-19 DIAGNOSIS — Z7952 Long term (current) use of systemic steroids: Secondary | ICD-10-CM | POA: Diagnosis not present

## 2015-01-19 DIAGNOSIS — G8929 Other chronic pain: Secondary | ICD-10-CM | POA: Insufficient documentation

## 2015-01-19 DIAGNOSIS — M7662 Achilles tendinitis, left leg: Secondary | ICD-10-CM

## 2015-01-19 DIAGNOSIS — Z7951 Long term (current) use of inhaled steroids: Secondary | ICD-10-CM | POA: Diagnosis not present

## 2015-01-19 DIAGNOSIS — Z86018 Personal history of other benign neoplasm: Secondary | ICD-10-CM | POA: Insufficient documentation

## 2015-01-19 DIAGNOSIS — Z9981 Dependence on supplemental oxygen: Secondary | ICD-10-CM | POA: Diagnosis not present

## 2015-01-19 DIAGNOSIS — I2510 Atherosclerotic heart disease of native coronary artery without angina pectoris: Secondary | ICD-10-CM | POA: Diagnosis not present

## 2015-01-19 DIAGNOSIS — Z79899 Other long term (current) drug therapy: Secondary | ICD-10-CM | POA: Insufficient documentation

## 2015-01-19 DIAGNOSIS — I10 Essential (primary) hypertension: Secondary | ICD-10-CM | POA: Insufficient documentation

## 2015-01-19 DIAGNOSIS — I252 Old myocardial infarction: Secondary | ICD-10-CM | POA: Insufficient documentation

## 2015-01-19 DIAGNOSIS — Z7982 Long term (current) use of aspirin: Secondary | ICD-10-CM | POA: Insufficient documentation

## 2015-01-19 DIAGNOSIS — M79672 Pain in left foot: Secondary | ICD-10-CM | POA: Diagnosis present

## 2015-01-19 DIAGNOSIS — J449 Chronic obstructive pulmonary disease, unspecified: Secondary | ICD-10-CM | POA: Insufficient documentation

## 2015-01-19 DIAGNOSIS — Z720 Tobacco use: Secondary | ICD-10-CM | POA: Diagnosis not present

## 2015-01-19 MED ORDER — HYDROCODONE-ACETAMINOPHEN 5-325 MG PO TABS: 1.0000 | ORAL_TABLET | Freq: Four times a day (QID) | ORAL | Status: DC | PRN

## 2015-01-19 MED ORDER — HYDROCODONE-ACETAMINOPHEN 5-325 MG PO TABS
1.0000 | ORAL_TABLET | Freq: Once | ORAL | Status: AC
Start: 2015-01-19 — End: 2015-01-19
  Administered 2015-01-19: 1 via ORAL
  Filled 2015-01-19: qty 1

## 2015-01-19 MED ORDER — NAPROXEN 500 MG PO TABS: 500.0000 mg | ORAL_TABLET | Freq: Two times a day (BID) | ORAL | Status: DC | PRN

## 2015-01-19 NOTE — ED Provider Notes (Signed)
CSN: [id number]     Arrival date & time 01/19/15  2505 History  By signing my name below, I, Terressa Koyanagi, attest that this documentation has been prepared under the direction and in the presence of [name], MD. Electronically Signed: Terressa Koyanagi, ED Scribe. 01/19/2015. 10:01 AM.  Chief Complaint  Patient presents with  . Foot Pain    left   HPI PCP: Petra Kuba, MD HPI Comments: Courtney Zuniga is a 64 y.o. female, with PMHx noted below including achilles tendinitis of LLE, who presents to the Emergency Department complaining of chronic, atraumatic left foot pain with current episode onset a few days ago. Pt rates her current pain a 10/10 and describes it as an aching pain. Associated Sx include swelling of left foot. Per pt, she was seen for the same at the ED a few days ago whereby she was Rx meloxicam and norco. Pt reports said meds helped with her Sx. Pt denies any injuries to left foot since last ED visit. Pt denies using her cam walker at home. Pt has an appointment with her orthopedist (Dr. Arther Abbott) on 01/25/15. She has not been using the cam walker given to her.  Past Medical History  Diagnosis Date  . Brain tumor (benign) ([place]) 2005 [facility]    Benign  . Hypertension   . Asthma   . Chronic headaches   . Chronic hip pain   . Chronic pain   . NSTEMI (non-ST elevated myocardial infarction) ([place])   . Coronary artery disease   . On home O2     qhs  . COPD (chronic obstructive pulmonary disease) [facility])    Past Surgical History  Procedure Laterality Date  . Cholecystectomy    . Tumor removal      Benign   Family History  Problem Relation Age of Onset  . Cancer Mother   . Cancer Father   . Cancer Sister    Social History  Substance Use Topics  . Smoking status: Current Every Day Smoker -- 0.33 packs/day    Types: Cigarettes  . Smokeless tobacco: None  . Alcohol Use: No   OB History    No data available     Review of Systems  Constitutional:  Negative for fever.  Musculoskeletal: Positive for joint swelling (swelling of left foot) and arthralgias (left foot pain).  All other systems reviewed and are negative.  Allergies  Review of patient's allergies indicates no known allergies.  Home Medications   Prior to Admission medications   Medication Sig Start Date End Date Taking? Authorizing Provider  albuterol (PROVENTIL HFA;VENTOLIN HFA) 108 (90 BASE) MCG/ACT inhaler Inhale 2 puffs into the lungs every 6 (six) hours as needed. Shortness of Breath    Historical Provider, MD  albuterol (PROVENTIL) (2.5 MG/3ML) 0.083% nebulizer solution Take 2.5 mg by nebulization every 6 (six) hours as needed for wheezing or shortness of breath.    Historical Provider, MD  aspirin EC 81 MG tablet Take 81 mg by mouth daily.    Historical Provider, MD  Aspirin-Acetaminophen-Caffeine 260-130-16 MG TABS Take 1 packet by mouth every 4 (four) hours as needed (Pain).    Historical Provider, MD  azithromycin (ZITHROMAX) 250 MG tablet Take 2 tablets PO day 1, then 1 tab PO daily x4 days. 11/16/14   Francine Graven, DO  calcium-vitamin D 250-100 MG-UNIT per tablet Take 1 tablet by mouth daily.    Historical Provider, MD  dexamethasone (DECADRON) 4 MG tablet Take 1 tablet (4 mg total)  by mouth 2 (two) times daily with a meal. 01/07/15   Lily Kocher, PA-C  dextromethorphan-guaiFENesin [facility] DM) 30-600 MG per 12 hr tablet Take 1 tablet by mouth 2 (two) times daily as needed (congestion).    Historical Provider, MD  DULoxetine (CYMBALTA) 30 MG capsule Take 30 mg by mouth 2 (two) times daily.  09/22/13   Historical Provider, MD  Fluticasone-Salmeterol (ADVAIR) 500-50 MCG/DOSE AEPB Inhale 1 puff into the lungs every 12 (twelve) hours.    Historical Provider, MD  gabapentin (NEURONTIN) 300 MG capsule Take 300 mg by mouth 3 (three) times daily.     Historical Provider, MD  HYDROcodone-acetaminophen (NORCO) 5-325 MG tablet Take 1 tablet by mouth every 6 (six) hours as  needed for severe pain. 01/19/15   [name], MD  isosorbide mononitrate (IMDUR) 60 MG 24 hr tablet Take 60 mg by mouth daily.  09/22/13   Historical Provider, MD  naproxen (NAPROSYN) 500 MG tablet Take 1 tablet (500 mg total) by mouth 2 (two) times daily as needed for mild pain or moderate pain. 01/19/15   [name], MD  nitroGLYCERIN (NITROSTAT) 0.4 MG SL tablet Place 0.4 mg under the tongue every 5 (five) minutes as needed for chest pain.  02/15/13   Historical Provider, MD  predniSONE (DELTASONE) 20 MG tablet Take 2 tablets (40 mg total) by mouth daily. 11/16/14   Francine Graven, DO  QVAR 80 MCG/ACT inhaler Inhale 1 puff into the lungs 2 (two) times daily.  02/13/14   Historical Provider, MD   Triage Vitals: BP 155/93 mmHg  Pulse 65  Temp(Src) 97.5 F (36.4 C) (Oral)  Resp 16  Ht '5\' 7"'$  (1.702 m)  Wt 150 lb (68.04 kg)  BMI 23.49 kg/m2  SpO2 100% Physical Exam  Constitutional: She is oriented to person, place, and time. She appears well-developed and well-nourished.  HENT:  Head: Normocephalic and atraumatic.  Right Ear: External ear normal.  Left Ear: External ear normal.  Nose: Nose normal.  Eyes: Right eye exhibits no discharge. Left eye exhibits no discharge.  Cardiovascular: Normal rate, regular rhythm and intact distal pulses.   Pulmonary/Chest: Effort normal.  Abdominal: She exhibits no distension.  Musculoskeletal:  Left foot: tderness over left achilles, appears intact, has normal thompson test, mild medial swelling, no tenderness or swelling over the foot, limited ROM due to pain, no erythema, 2+ DP pulse  Neurological: She is alert and oriented to person, place, and time.  Skin: Skin is warm and dry. No erythema.  Nursing note and vitals reviewed.  ED Course  Procedures (including critical care time) DIAGNOSTIC STUDIES: Oxygen Saturation is 100% on RA, nl by my interpretation.    COORDINATION OF CARE: 9:57 AM: Discussed treatment plan which includes meds for  pain management, f/u with orthopedist, with pt at bedside; patient verbalizes understanding and agrees with treatment plan.  MDM   Final diagnoses:  Left Achilles tendinitis    Patient with acute on chronic left heel pain associated with Achilles tendinitis. Achilles appears intact with mild swelling. No signs of acute rupture. Patient has limited range of motion but it is present and seems associated with pain. No pain over plantar fascia and no signs of acute injury. Plan to use ice, NSAIDs, and limited course of narcotics. Is to see her orthopedist next week. No signs of septic joint or acute infection. Encouraged use of the cam walker.  I personally performed the services described in this documentation, which was scribed in my presence. The  recorded information has been reviewed and is accurate.    [name], MD 01/19/15 [id number]

## 2015-01-19 NOTE — ED Notes (Signed)
Pt made aware to return if symptoms worsen or if any life threatening symptoms occur.   

## 2015-01-19 NOTE — ED Notes (Signed)
Having left foot pain, rates pain 10/10.  To see Dr Aline Brochure on Oct 13th for injection.  Denies injury.

## 2015-01-19 NOTE — Discharge Instructions (Signed)
Achilles Tendinitis Achilles tendinitis is inflammation of the tough, cord-like band that attaches the lower muscles of your leg to your heel (Achilles tendon). It is usually caused by overusing the tendon and joint involved.  CAUSES Achilles tendinitis can happen because of:  A sudden increase in exercise or activity (such as running).  Doing the same exercises or activities (such as jumping) over and over.  Not warming up calf muscles before exercising.  Exercising in shoes that are worn out or not made for exercise.  Having arthritis or a bone growth on the back of the heel bone. This can rub against the tendon and hurt the tendon. SIGNS AND SYMPTOMS The most common symptoms are:  Pain in the back of the leg, just above the heel. The pain usually gets worse with exercise and better with rest.  Stiffness or soreness in the back of the leg, especially in the morning.  Swelling of the skin over the Achilles tendon.  Trouble standing on tiptoe. Sometimes, an Achilles tendon tears (ruptures). Symptoms of an Achilles tendon rupture can include:  Sudden, severe pain in the back of the leg.  Trouble putting weight on the foot or walking normally. DIAGNOSIS Achilles tendinitis will be diagnosed based on symptoms and a physical examination. An X-ray may be done to check if another condition is causing your symptoms. An MRI may be ordered if your health care provider suspects you may have completely torn your tendon, which is called an Achilles tendon rupture.  TREATMENT  Achilles tendinitis usually gets better over time. It can take weeks to months to heal completely. Treatment focuses on treating the symptoms and helping the injury heal. HOME CARE INSTRUCTIONS   Rest your Achilles tendon and avoid activities that cause pain.  Apply ice to the injured area:  Put ice in a plastic bag.  Place a towel between your skin and the bag.  Leave the ice on for 20 minutes, 2-3 times a  day  Try to avoid using the tendon (other than gentle range of motion) while the tendon is painful. Do not resume use until instructed by your health care provider. Then begin use gradually. Do not increase use to the point of pain. If pain does develop, decrease use and continue the above measures. Gradually increase activities that do not cause discomfort until you achieve normal use.  Do exercises to make your calf muscles stronger and more flexible. Your health care provider or physical therapist can recommend exercises for you to do.  Wrap your ankle with an elastic bandage or other wrap. This can help keep your tendon from moving too much. Your health care provider will show you how to wrap your ankle correctly.  Only take over-the-counter or prescription medicines for pain, discomfort, or fever as directed by your health care provider. SEEK MEDICAL CARE IF:   Your pain and swelling increase or pain is uncontrolled with medicines.  You develop new, unexplained symptoms or your symptoms get worse.  You are unable to move your toes or foot.  You develop warmth and swelling in your foot.  You have an unexplained temperature. MAKE SURE YOU:   Understand these instructions.  Will watch your condition.  Will get help right away if you are not doing well or get worse.   This information is not intended to replace advice given to you by your health care provider. Make sure you discuss any questions you have with your health care provider.   Document Released:   01/08/2005 Document Revised: 04/21/2014 Document Reviewed: 11/10/2012 Else[employer] Interactive Patient Education 2016 [employer]  

## 2015-01-25 ENCOUNTER — Telehealth: Payer: Self-pay | Admitting: Orthopedic Surgery

## 2015-01-25 ENCOUNTER — Ambulatory Visit (INDEPENDENT_AMBULATORY_CARE_PROVIDER_SITE_OTHER): Payer: Medicare HMO | Admitting: Orthopedic Surgery

## 2015-01-25 ENCOUNTER — Encounter: Payer: Self-pay | Admitting: Orthopedic Surgery

## 2015-01-25 ENCOUNTER — Other Ambulatory Visit: Payer: Self-pay | Admitting: *Deleted

## 2015-01-25 VITALS — BP 146/96 | Ht 67.0 in | Wt 150.0 lb

## 2015-01-25 DIAGNOSIS — M7662 Achilles tendinitis, left leg: Secondary | ICD-10-CM

## 2015-01-25 MED ORDER — PREDNISONE 10 MG PO TABS: 10.0000 mg | ORAL_TABLET | Freq: Every day | ORAL | Status: DC

## 2015-01-25 NOTE — Progress Notes (Signed)
Chief Complaint  Patient presents with  . Foot Pain    ER foolow up on left foot. No injury.    Courtney Zuniga has Achilles tendinitis went to the ER October 7 with acute pain again I last saw her in April it looks like we treated with a Cam Walker and start her on anti-inflammatories  She has posterior heel pain and swelling and trouble walking. Symptoms now over one week  Exam shows tenderness and swelling of the Achilles tendon retrocalcaneal bursa is tender without swelling painful range of motion in dorsiflexion no weakness no palpable defect neurovascular exam is intact patient has noticeable limp appearance otherwise normal  Review of systems negative  Continue prednisone. Crutches no weightbearing 4 weeks. Out of work 4 weeks. Follow-up 4 weeks. Patient says she cannot with a Cam Walker  Advised her to get a set of clogs

## 2015-01-25 NOTE — Telephone Encounter (Signed)
Patient aware.

## 2015-01-25 NOTE — Telephone Encounter (Signed)
Medication sent.

## 2015-01-25 NOTE — Telephone Encounter (Signed)
Patient is calling stating that medication has not been called to the pharmacy while she was here today, please advise?

## 2015-01-25 NOTE — Patient Instructions (Signed)
Use crutches  No weight bearing x 1 month  Take Prednisone  Out of work x 4 weeks

## 2015-02-22 ENCOUNTER — Encounter: Payer: Self-pay | Admitting: *Deleted

## 2015-02-22 ENCOUNTER — Ambulatory Visit (INDEPENDENT_AMBULATORY_CARE_PROVIDER_SITE_OTHER): Payer: Medicare HMO | Admitting: Orthopedic Surgery

## 2015-02-22 DIAGNOSIS — M7662 Achilles tendinitis, left leg: Secondary | ICD-10-CM

## 2015-02-22 NOTE — Progress Notes (Signed)
No show

## 2015-05-11 ENCOUNTER — Ambulatory Visit (HOSPITAL_COMMUNITY)
Admission: RE | Admit: 2015-05-11 | Discharge: 2015-05-11 | Disposition: A | Discharge status: Home / Self Care | Payer: Medicare HMO | Source: Ambulatory Visit | Attending: Nurse Practitioner | Admitting: Nurse Practitioner

## 2015-05-11 DIAGNOSIS — R011 Cardiac murmur, unspecified: Secondary | ICD-10-CM | POA: Insufficient documentation

## 2015-07-13 ENCOUNTER — Emergency Department (HOSPITAL_COMMUNITY)
Admission: EM | Admit: 2015-07-13 | Discharge: 2015-07-13 | Disposition: A | Discharge status: Home / Self Care | Payer: Medicare HMO | Attending: Emergency Medicine | Admitting: Emergency Medicine

## 2015-07-13 ENCOUNTER — Emergency Department (HOSPITAL_COMMUNITY): Payer: Medicare HMO

## 2015-07-13 ENCOUNTER — Encounter (HOSPITAL_COMMUNITY): Payer: Self-pay | Admitting: Emergency Medicine

## 2015-07-13 DIAGNOSIS — J45909 Unspecified asthma, uncomplicated: Secondary | ICD-10-CM | POA: Insufficient documentation

## 2015-07-13 DIAGNOSIS — F17210 Nicotine dependence, cigarettes, uncomplicated: Secondary | ICD-10-CM | POA: Diagnosis not present

## 2015-07-13 DIAGNOSIS — R1013 Epigastric pain: Secondary | ICD-10-CM | POA: Insufficient documentation

## 2015-07-13 DIAGNOSIS — J449 Chronic obstructive pulmonary disease, unspecified: Secondary | ICD-10-CM | POA: Diagnosis not present

## 2015-07-13 DIAGNOSIS — I10 Essential (primary) hypertension: Secondary | ICD-10-CM | POA: Diagnosis not present

## 2015-07-13 DIAGNOSIS — Z79899 Other long term (current) drug therapy: Secondary | ICD-10-CM | POA: Diagnosis not present

## 2015-07-13 DIAGNOSIS — M545 Low back pain: Secondary | ICD-10-CM | POA: Insufficient documentation

## 2015-07-13 DIAGNOSIS — R109 Unspecified abdominal pain: Secondary | ICD-10-CM

## 2015-07-13 LAB — COMPREHENSIVE METABOLIC PANEL
ALK PHOS: 190 U/L — AB (ref 38–126)
ALT: 13 U/L — ABNORMAL LOW (ref 14–54)
ANION GAP: 10 (ref 5–15)
AST: 19 U/L (ref 15–41)
Albumin: 4.4 g/dL (ref 3.5–5.0)
BUN: 12 mg/dL (ref 6–20)
CALCIUM: 9.3 mg/dL (ref 8.9–10.3)
CO2: 26 mmol/L (ref 22–32)
Chloride: 106 mmol/L (ref 101–111)
Creatinine, Ser: 0.62 mg/dL (ref 0.44–1.00)
GFR calc Af Amer: >60 mL/min (ref >60)
GFR calc non Af Amer: >60 mL/min (ref >60)
Glucose, Bld: 83 mg/dL (ref 65–99)
POTASSIUM: 4 mmol/L (ref 3.5–5.1)
SODIUM: 142 mmol/L (ref 135–145)
Total Bilirubin: 0.4 mg/dL (ref 0.3–1.2)
Total Protein: 7.5 g/dL (ref 6.5–8.1)

## 2015-07-13 LAB — CBC
HEMATOCRIT: 43.6 % (ref 36.0–46.0)
HEMOGLOBIN: 15.5 g/dL — AB (ref 12.0–15.0)
MCH: 30.2 pg (ref 26.0–34.0)
MCHC: 35.6 g/dL (ref 30.0–36.0)
MCV: 84.8 fL (ref 78.0–100.0)
Platelets: 211 10*3/uL (ref 150–400)
RBC: 5.14 MIL/uL — ABNORMAL HIGH (ref 3.87–5.11)
RDW: 14.4 % (ref 11.5–15.5)
WBC: 7.2 10*3/uL (ref 4.0–10.5)

## 2015-07-13 LAB — URINALYSIS, ROUTINE W REFLEX MICROSCOPIC
BILIRUBIN URINE: NEGATIVE
Glucose, UA: NEGATIVE mg/dL
HGB URINE DIPSTICK: NEGATIVE
Ketones, ur: NEGATIVE mg/dL
Leukocytes, UA: NEGATIVE
Nitrite: NEGATIVE
PH: 6 (ref 5.0–8.0)
Protein, ur: NEGATIVE mg/dL
SPECIFIC GRAVITY, URINE: 1.005 (ref 1.005–1.030)

## 2015-07-13 LAB — LIPASE, BLOOD: Lipase: 22 U/L (ref 11–51)

## 2015-07-13 MED ORDER — ONDANSETRON 4 MG PO TBDP: ORAL_TABLET | ORAL | Status: DC

## 2015-07-13 MED ORDER — HYDROCODONE-ACETAMINOPHEN 5-325 MG PO TABS: 1.0000 | ORAL_TABLET | Freq: Four times a day (QID) | ORAL | Status: DC | PRN

## 2015-07-13 MED ORDER — IOHEXOL 300 MG/ML  SOLN
100.0000 mL | Freq: Once | INTRAMUSCULAR | Status: AC | PRN
  Administered 2015-07-13: 100 mL via INTRAVENOUS

## 2015-07-13 MED ORDER — DIATRIZOATE MEGLUMINE & SODIUM 66-10 % PO SOLN
ORAL | Status: AC
  Administered 2015-07-13: 30 mL
  Filled 2015-07-13: qty 30

## 2015-07-13 MED ORDER — PANTOPRAZOLE SODIUM 20 MG PO TBEC: 20.0000 mg | DELAYED_RELEASE_TABLET | Freq: Every day | ORAL | Status: DC

## 2015-07-13 MED ORDER — HYDROMORPHONE HCL 1 MG/ML IJ SOLN
1.0000 mg | Freq: Once | INTRAMUSCULAR | Status: AC
  Administered 2015-07-13: 1 mg via INTRAVENOUS
  Filled 2015-07-13: qty 1

## 2015-07-13 MED ORDER — ONDANSETRON HCL 4 MG/2ML IJ SOLN
4.0000 mg | Freq: Once | INTRAMUSCULAR | Status: AC
  Administered 2015-07-13: 4 mg via INTRAVENOUS
  Filled 2015-07-13: qty 2

## 2015-07-13 NOTE — ED Notes (Signed)
PT c/o lower abdominal pain into her back with darker than her normal urine x1 day. PT states no vomiting or diarrhea.

## 2015-07-13 NOTE — Discharge Instructions (Signed)
Follow-up with the stomach Dr..   Dr. Wonda Amis,   In one week.

## 2015-07-13 NOTE — ED Notes (Signed)
Pt states she is having mid lower back pain.  Denies abdominal pain currently. States she has had this before, muscle relaxers improve it, movement worsens it, denies any associated symptoms.

## 2015-07-13 NOTE — ED Provider Notes (Signed)
CSN: [id number]     Arrival date & time 07/13/15  1140 History   First MD Initiated Contact with Patient 07/13/15 1608     Chief Complaint  Patient presents with  . Abdominal Pain     (Consider location/radiation/quality/duration/timing/severity/associated sxs/prior Treatment) Patient is a 65 y.o. female presenting with abdominal pain. The history is provided by the patient (Patient complains of upper abdominal pain and pain going into her back).  Abdominal Pain Pain location:  Epigastric Pain quality: aching   Pain radiation: Radiates to back. Pain severity:  Moderate Onset quality:  Sudden Timing:  Constant Progression:  Waxing and waning Context: not alcohol use   Associated symptoms: no chest pain, no cough, no diarrhea, no fatigue and no hematuria     Past Medical History  Diagnosis Date  . Brain tumor (benign) ([place]) 2005 [facility]    Benign  . Hypertension   . Asthma   . Chronic headaches   . Chronic hip pain   . Chronic pain   . NSTEMI (non-ST elevated myocardial infarction) ([place])   . Coronary artery disease   . On home O2     qhs  . COPD (chronic obstructive pulmonary disease) [facility])    Past Surgical History  Procedure Laterality Date  . Cholecystectomy    . Tumor removal      Benign   Family History  Problem Relation Age of Onset  . Cancer Mother   . Cancer Father   . Cancer Sister    Social History  Substance Use Topics  . Smoking status: Current Every Day Smoker -- 0.50 packs/day    Types: Cigarettes  . Smokeless tobacco: None  . Alcohol Use: No   OB History    No data available     Review of Systems  Constitutional: Negative for appetite change and fatigue.  HENT: Negative for congestion, ear discharge and sinus pressure.   Eyes: Negative for discharge.  Respiratory: Negative for cough.   Cardiovascular: Negative for chest pain.  Gastrointestinal: Positive for abdominal pain. Negative for diarrhea.  Genitourinary: Negative for frequency and  hematuria.  Musculoskeletal: Positive for back pain.  Skin: Negative for rash.  Neurological: Negative for seizures and headaches.  Psychiatric/Behavioral: Negative for hallucinations.      Allergies  Review of patient's allergies indicates no known allergies.  Home Medications   Prior to Admission medications   Medication Sig Start Date End Date Taking? Authorizing Provider  albuterol (PROVENTIL HFA;VENTOLIN HFA) 108 (90 BASE) MCG/ACT inhaler Inhale 2 puffs into the lungs every 6 (six) hours as needed. Shortness of Breath    Historical Provider, MD  albuterol (PROVENTIL) (2.5 MG/3ML) 0.083% nebulizer solution Take 2.5 mg by nebulization every 6 (six) hours as needed for wheezing or shortness of breath.    Historical Provider, MD  aspirin EC 81 MG tablet Take 81 mg by mouth daily.    Historical Provider, MD  Aspirin-Acetaminophen-Caffeine 260-130-16 MG TABS Take 1 packet by mouth every 4 (four) hours as needed (Pain).    Historical Provider, MD  azithromycin (ZITHROMAX) 250 MG tablet Take 2 tablets PO day 1, then 1 tab PO daily x4 days. 11/16/14   Francine Graven, DO  calcium-vitamin D 250-100 MG-UNIT per tablet Take 1 tablet by mouth daily.    Historical Provider, MD  dexamethasone (DECADRON) 4 MG tablet Take 1 tablet (4 mg total) by mouth 2 (two) times daily with a meal. 01/07/15   Lily Kocher, PA-C  dextromethorphan-guaiFENesin [facility] DM) 30-600 MG per  12 hr tablet Take 1 tablet by mouth 2 (two) times daily as needed (congestion).    Historical Provider, MD  DULoxetine (CYMBALTA) 30 MG capsule Take 30 mg by mouth 2 (two) times daily.  09/22/13   Historical Provider, MD  Fluticasone-Salmeterol (ADVAIR) 500-50 MCG/DOSE AEPB Inhale 1 puff into the lungs every 12 (twelve) hours.    Historical Provider, MD  gabapentin (NEURONTIN) 300 MG capsule Take 300 mg by mouth 3 (three) times daily.     Historical Provider, MD  HYDROcodone-acetaminophen (NORCO/VICODIN) 5-325 MG tablet Take 1 tablet by  mouth every 6 (six) hours as needed. 07/13/15   [name], MD  isosorbide mononitrate (IMDUR) 60 MG 24 hr tablet Take 60 mg by mouth daily.  09/22/13   Historical Provider, MD  naproxen (NAPROSYN) 500 MG tablet Take 1 tablet (500 mg total) by mouth 2 (two) times daily as needed for mild pain or moderate pain. 01/19/15   [name], MD  nitroGLYCERIN (NITROSTAT) 0.4 MG SL tablet Place 0.4 mg under the tongue every 5 (five) minutes as needed for chest pain.  02/15/13   Historical Provider, MD  ondansetron (ZOFRAN ODT) 4 MG disintegrating tablet '4mg'$  ODT q4 hours prn nausea/vomit 07/13/15   [name], MD  pantoprazole (PROTONIX) 20 MG tablet Take 1 tablet (20 mg total) by mouth daily. 07/13/15   [name], MD  predniSONE (DELTASONE) 10 MG tablet Take 1 tablet (10 mg total) by mouth daily. 01/25/15   Carole Civil, MD  predniSONE (DELTASONE) 20 MG tablet Take 2 tablets (40 mg total) by mouth daily. 11/16/14   Francine Graven, DO  QVAR 80 MCG/ACT inhaler Inhale 1 puff into the lungs 2 (two) times daily.  02/13/14   Historical Provider, MD   BP 134/80 mmHg  Pulse 71  Temp(Src) 98.5 F (36.9 C) (Oral)  Resp 20  Ht '5\' 7"'$  (1.702 m)  Wt 148 lb (67.132 kg)  BMI 23.17 kg/m2  SpO2 98% Physical Exam  Constitutional: She is oriented to person, place, and time. She appears well-developed.  HENT:  Head: Normocephalic.  Eyes: Conjunctivae and EOM are normal. No scleral icterus.  Neck: Neck supple. No thyromegaly present.  Cardiovascular: Normal rate and regular rhythm.  Exam reveals no gallop and no friction rub.   No murmur heard. Pulmonary/Chest: No stridor. She has no wheezes. She has no rales. She exhibits no tenderness.  Abdominal: She exhibits no distension. There is tenderness. There is no rebound.  Mild epigastric tenderness  Musculoskeletal: Normal range of motion. She exhibits no edema.  Mild lower and mid back tenderness  Lymphadenopathy:    She has no cervical adenopathy.   Neurological: She is oriented to person, place, and time. She exhibits normal muscle tone. Coordination normal.  Skin: No rash noted. No erythema.  Psychiatric: She has a normal mood and affect. Her behavior is normal.    ED Course  Procedures (including critical care time) Labs Review Labs Reviewed  COMPREHENSIVE METABOLIC PANEL - Abnormal; Notable for the following:    ALT 13 (*)    Alkaline Phosphatase 190 (*)    All other components within normal limits  CBC - Abnormal; Notable for the following:    RBC 5.14 (*)    Hemoglobin 15.5 (*)    All other components within normal limits  URINALYSIS, ROUTINE W REFLEX MICROSCOPIC (NOT AT [facility])  LIPASE, BLOOD    Imaging Review Ct Abdomen Pelvis W Contrast  07/13/2015  CLINICAL DATA:  65 year old female with abdominal  and pelvic pain. EXAM: CT ABDOMEN AND PELVIS WITH CONTRAST TECHNIQUE: Multidetector CT imaging of the abdomen and pelvis was performed using the standard protocol following bolus administration of intravenous contrast. CONTRAST:  181m OMNIPAQUE IOHEXOL 300 MG/ML  SOLN COMPARISON:  06/13/2010 CT FINDINGS: Lower chest:  Mild bibasilar atelectasis/scarring again. Hepatobiliary: The liver is unremarkable. The patient is status post cholecystectomy. Upper limits of normal CBD diameter are unchanged. Pancreas: Unremarkable Spleen: Unremarkable Adrenals/Urinary Tract: The kidneys, adrenal glands and bladder are unremarkable. There is no evidence of hydronephrosis. Stomach/Bowel: A 1.5 cm filling defect along the posterior proximal stomach is suspicious for a gastric mass/ GI stromal tumor. There is no evidence of bowel obstruction or other definite areas of focal bowel wall thickening. Vascular/Lymphatic: Aortic atherosclerotic calcifications noted without aneurysm. No enlarged lymph nodes identified. 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 Other: No free fluid, abscess or pneumoperitoneum. Musculoskeletal: No acute or  suspicious abnormalities. IMPRESSION: No evidence of acute abnormality. 1.5 cm filling defect along the posterior proximal stomach suspicious for gastric mass/ GI stromal tumor. Direct inspection is recommended. Aortic atherosclerosis. Electronically Signed   By: JMargarette CanadaM.D.   On: 07/13/2015 18:02   I have personally reviewed and evaluated these images and lab results as part of my medical decision-making.   EKG Interpretation None      MDM   Final diagnoses:  Abdominal pain in female    CT scan shows possible gastric mass. Patient is put on Vicodin protonic and Zofran and referred to GI for endoscopy    JMilton Ferguson MD 07/13/15 [id number]

## 2015-08-08 ENCOUNTER — Ambulatory Visit (INDEPENDENT_AMBULATORY_CARE_PROVIDER_SITE_OTHER): Payer: Medicare HMO | Admitting: Nurse Practitioner

## 2015-08-08 ENCOUNTER — Other Ambulatory Visit: Payer: Self-pay

## 2015-08-08 ENCOUNTER — Encounter: Payer: Self-pay | Admitting: Nurse Practitioner

## 2015-08-08 VITALS — BP 119/80 | HR 82 | Temp 98.2°F | Ht 67.0 in | Wt 140.6 lb

## 2015-08-08 DIAGNOSIS — R9389 Abnormal findings on diagnostic imaging of other specified body structures: Secondary | ICD-10-CM

## 2015-08-08 DIAGNOSIS — R935 Abnormal findings on diagnostic imaging of other abdominal regions, including retroperitoneum: Secondary | ICD-10-CM | POA: Diagnosis not present

## 2015-08-08 DIAGNOSIS — R109 Unspecified abdominal pain: Secondary | ICD-10-CM | POA: Insufficient documentation

## 2015-08-08 DIAGNOSIS — R1013 Epigastric pain: Secondary | ICD-10-CM | POA: Diagnosis not present

## 2015-08-08 NOTE — Patient Instructions (Signed)
1. We will request your previous colonoscopy report from [facility]. 2. Increase her Protonix to 40 mg once a day. If he still have 20 mg pills remaining you can take 2 of those once a day. I will send in a prescription to your pharmacy for 40 mg pills that you can have filled after you complete your current supply. 3. We will set up your procedure for you. 4. If you have any severe symptoms, intolerable abdominal pain, unable to eat or drink, uncontrollable nausea or vomiting, vomiting blood, lots of blood in her stools, or other scary signs he can call us or proceed to the emergency room.

## 2015-08-08 NOTE — Progress Notes (Signed)
CC'ED TO PCP 

## 2015-08-08 NOTE — Progress Notes (Signed)
Primary Care Physician:  Barry Dienes, NP Primary Gastroenterologist:  Dr. Gala Romney  Chief Complaint  Patient presents with  . Follow-up    er    HPI:   Courtney Zuniga is a 65 y.o. female who presents on referral from the emergency department for follow-up on abdominal pain. She was seen in the emergency department 07/13/2015 for abdominal pain. At that time the pain was described as epigastric, aching, radiating to the back which is constant with waxing and waning severity and progression. Denied alcohol use at that time. Notes at that time she was on Protonix. CT of the abdomen and pelvis with contrast was ordered which found a 1.5 cm filling defect on the posterior proximal stomach suspicious for gastric mass/GI stromal tumor. Recommended direct visualization. No   Today she states she's still abdominal pain in her mid abdomen, sharp, radiates to her back. When she "gets to coughing it feels like something balls up on the inside." Takes about 6 BC powders a day. Minimal NSAIDs as well. Some nausea as well, improved with Zofran. No vomiting. Is on Protonix 20 mg daily. Denies hematochezia and melena. Has had cholecystomy. Denies fever, chills. Has lost subjectively 15 pounds in the past 1-2 months. Appetite isn't as good lately. Last colonoscopy 2 years ago in [place] at [facility]. She was told it was clean. Denies chest pain, dyspnea, dizziness, lightheadedness, syncope, near syncope. Denies any other upper or lower GI symptoms.  Past Medical History  Diagnosis Date  . Brain tumor (benign) ([place]) 2005 [facility]    Benign  . Hypertension   . Asthma   . Chronic headaches   . Chronic hip pain   . Chronic pain   . NSTEMI (non-ST elevated myocardial infarction) ([place])   . Coronary artery disease   . On home O2     qhs  . COPD (chronic obstructive pulmonary disease) [facility])     Past Surgical History  Procedure Laterality Date  . Cholecystectomy    . Tumor removal      Benign     Current Outpatient Prescriptions  Medication Sig Dispense Refill  . albuterol (PROVENTIL HFA;VENTOLIN HFA) 108 (90 BASE) MCG/ACT inhaler Inhale 2 puffs into the lungs every 6 (six) hours as needed. Shortness of Breath    . albuterol (PROVENTIL) (2.5 MG/3ML) 0.083% nebulizer solution Take 2.5 mg by nebulization every 6 (six) hours as needed for wheezing or shortness of breath.    Marland Kitchen aspirin EC 81 MG tablet Take 81 mg by mouth daily.    . Aspirin-Acetaminophen-Caffeine 260-130-16 MG TABS Take 1 packet by mouth every 4 (four) hours as needed (Pain).    . calcium-vitamin D 250-100 MG-UNIT per tablet Take 1 tablet by mouth daily.    Marland Kitchen dexamethasone (DECADRON) 4 MG tablet Take 1 tablet (4 mg total) by mouth 2 (two) times daily with a meal. 12 tablet 0  . dextromethorphan-guaiFENesin (MUCINEX DM) 30-600 MG per 12 hr tablet Take 1 tablet by mouth 2 (two) times daily as needed (congestion).    . DULoxetine (CYMBALTA) 30 MG capsule Take 30 mg by mouth 2 (two) times daily.     . Fluticasone-Salmeterol (ADVAIR) 500-50 MCG/DOSE AEPB Inhale 1 puff into the lungs every 12 (twelve) hours.    Marland Kitchen HYDROcodone-acetaminophen (NORCO/VICODIN) 5-325 MG tablet Take 1 tablet by mouth every 6 (six) hours as needed. 20 tablet 0  . isosorbide mononitrate (IMDUR) 60 MG 24 hr tablet Take 60 mg by mouth daily.     Marland Kitchen  naproxen (NAPROSYN) 500 MG tablet Take 1 tablet (500 mg total) by mouth 2 (two) times daily as needed for mild pain or moderate pain. 30 tablet 0  . nitroGLYCERIN (NITROSTAT) 0.4 MG SL tablet Place 0.4 mg under the tongue every 5 (five) minutes as needed for chest pain.     Marland Kitchen ondansetron (ZOFRAN ODT) 4 MG disintegrating tablet '4mg'$  ODT q4 hours prn nausea/vomit 12 tablet 0  . QVAR 80 MCG/ACT inhaler Inhale 1 puff into the lungs 2 (two) times daily.     Marland Kitchen azithromycin (ZITHROMAX) 250 MG tablet Take 2 tablets PO day 1, then 1 tab PO daily x4 days. (Patient not taking: Reported on 08/08/2015) 6 tablet 0  . gabapentin  (NEURONTIN) 300 MG capsule Take 300 mg by mouth 3 (three) times daily. Reported on 08/08/2015    . pantoprazole (PROTONIX) 20 MG tablet Take 1 tablet (20 mg total) by mouth daily. (Patient not taking: Reported on 08/08/2015) 30 tablet 0  . predniSONE (DELTASONE) 10 MG tablet Take 1 tablet (10 mg total) by mouth daily. (Patient not taking: Reported on 08/08/2015) 60 tablet 0  . predniSONE (DELTASONE) 20 MG tablet Take 2 tablets (40 mg total) by mouth daily. (Patient not taking: Reported on 08/08/2015) 10 tablet 0   No current facility-administered medications for this visit.    Allergies as of 08/08/2015  . (No Known Allergies)    Family History  Problem Relation Age of Onset  . Cancer Mother   . Cancer Father   . Cancer Sister     Social History   Social History  . Marital Status: Legally Separated    Spouse Name: N/A  . Number of Children: N/A  . Years of Education: 10th grade   Occupational History  . retired    Social History Main Topics  . Smoking status: Current Every Day Smoker -- 0.50 packs/day    Types: Cigarettes  . Smokeless tobacco: Not on file  . Alcohol Use: No  . Drug Use: No  . Sexual Activity: No   Other Topics Concern  . Not on file   Social History Narrative    Review of Systems: General: Negative for fever, chills, fatigue, weakness. ENT: Negative for hoarseness, difficulty swallowing. CV: Negative for chest pain, angina, palpitations,  peripheral edema.  Respiratory: Negative for dyspnea at rest, worsening cough, sputum, wheezing.  GI: See history of present illness. MS: Negative for joint pain, low back pain.  Derm: Negative for rash or itching.  Endo: Positive for unusual weight change.  Heme: Negative for bruising or bleeding. Allergy: Negative for rash or hives.    Physical Exam: BP 119/80 mmHg  Pulse 82  Temp(Src) 98.2 F (36.8 C)  Ht '5\' 7"'$  (1.702 m)  Wt 140 lb 9.6 oz (63.776 kg)  BMI 22.02 kg/m2 General:   Alert and oriented.  Pleasant and cooperative. Well-nourished and well-developed. Very thin female with some ribs visible in her upper chest. Head:  Normocephalic and atraumatic. Eyes:  Without icterus, sclera clear and conjunctiva pink.  Cardiovascular:  S1, S2 present without murmurs appreciated. Normal bilateral DP pulses noted. Extremities without clubbing or edema. Respiratory:  Clear to auscultation bilaterally. No wheezes, rales, or rhonchi. No distress.  Gastrointestinal:  +BS, soft, and non-distended. Moderate epigastric and LUQ abdominal pain. No HSM noted. No guarding or rebound. No masses appreciated.  Rectal:  Deferred  Musculoskalatal:  Symmetrical without gross deformities. Skin:  Intact without significant lesions or rashes. Neurologic:  Alert and oriented x4;  grossly normal neurologically. Psych:  Alert and cooperative. Normal mood and affect. Heme/Lymph/Immune: No excessive bruising noted.    08/08/2015 9:29 AM   Disclaimer: This note was dictated with voice recognition software. Similar sounding words can inadvertently be transcribed and may not be corrected upon review.

## 2015-08-08 NOTE — Assessment & Plan Note (Signed)
Issue with sharp epigastric abdominal pain. Currently on Protonix 20 mg daily. She was seen in the emergency department and CAT scan showed abnormal area possible GI stromal tumor. Patient also admits to taking about 6 BC powders a day and occasional NSAIDs on top of that due to headaches. Recommended she contact her primary care for more effective headache management, stopped taking all NSAIDs and ASA powders. I will increase her Protonix to 40 mg a day. We will schedule her for an endoscopy as noted below. Return for follow-up in 2 months for post procedure.

## 2015-08-08 NOTE — Assessment & Plan Note (Signed)
CT of the abdomen and pelvis with contrast in the emergency department found 1.5 cm non-filling defect area concerning for GI stromal tumor with recommended direct visualization. At this point we'll set her up for an endoscopy to further evaluate.  Proceed with EGD with Dr. Gala Romney in near future: the risks, benefits, and alternatives have been discussed with the patient in detail. The patient states understanding and desires to proceed.  The patient is not on any anticoagulants, she does have a perception for Norco for pain however this is just recently prescribed in the emergency department less than a month ago. Also on Cymbalta 30 mg. Conscious sedation should be adequate for her procedure.  The patient does have a history of COPD and wears oxygen at home. I have requested that respiratory therapy be notified of her situation should additional supplemental oxygen mediated.

## 2015-08-10 ENCOUNTER — Telehealth: Payer: Self-pay

## 2015-08-10 MED ORDER — PANTOPRAZOLE SODIUM 40 MG PO TBEC: 40.0000 mg | DELAYED_RELEASE_TABLET | Freq: Every day | ORAL | Status: DC

## 2015-08-10 NOTE — Telephone Encounter (Signed)
Pt is calling because she said that you was going to call in Protonix since we upped her dose. Please advise

## 2015-08-10 NOTE — Telephone Encounter (Signed)
In her AVS I had her double up on the 30 mg pills until she ran out. I assume she's run out so I sent in an Rx to her pharmacy. Please notify her.

## 2015-08-10 NOTE — Telephone Encounter (Signed)
Rx printed and I faxed to [facility] and called and informed pt.

## 2015-08-14 ENCOUNTER — Encounter (HOSPITAL_COMMUNITY)
Admission: RE | Disposition: A | Discharge status: Home / Self Care | Payer: Self-pay | Source: Ambulatory Visit | Attending: Internal Medicine

## 2015-08-14 ENCOUNTER — Telehealth: Payer: Self-pay

## 2015-08-14 ENCOUNTER — Ambulatory Visit (HOSPITAL_COMMUNITY)
Admission: RE | Admit: 2015-08-14 | Discharge: 2015-08-14 | Disposition: A | Discharge status: Home / Self Care | Payer: Medicare HMO | Source: Ambulatory Visit | Attending: Internal Medicine | Admitting: Internal Medicine

## 2015-08-14 ENCOUNTER — Other Ambulatory Visit: Payer: Self-pay

## 2015-08-14 ENCOUNTER — Encounter (HOSPITAL_COMMUNITY): Payer: Self-pay

## 2015-08-14 DIAGNOSIS — Z9981 Dependence on supplemental oxygen: Secondary | ICD-10-CM | POA: Diagnosis not present

## 2015-08-14 DIAGNOSIS — R1013 Epigastric pain: Secondary | ICD-10-CM | POA: Diagnosis not present

## 2015-08-14 DIAGNOSIS — K2950 Unspecified chronic gastritis without bleeding: Secondary | ICD-10-CM | POA: Diagnosis not present

## 2015-08-14 DIAGNOSIS — G8929 Other chronic pain: Secondary | ICD-10-CM | POA: Diagnosis not present

## 2015-08-14 DIAGNOSIS — I252 Old myocardial infarction: Secondary | ICD-10-CM | POA: Diagnosis not present

## 2015-08-14 DIAGNOSIS — J449 Chronic obstructive pulmonary disease, unspecified: Secondary | ICD-10-CM | POA: Insufficient documentation

## 2015-08-14 DIAGNOSIS — K319 Disease of stomach and duodenum, unspecified: Secondary | ICD-10-CM | POA: Insufficient documentation

## 2015-08-14 DIAGNOSIS — K269 Duodenal ulcer, unspecified as acute or chronic, without hemorrhage or perforation: Secondary | ICD-10-CM | POA: Insufficient documentation

## 2015-08-14 DIAGNOSIS — R9389 Abnormal findings on diagnostic imaging of other specified body structures: Secondary | ICD-10-CM

## 2015-08-14 DIAGNOSIS — Z7982 Long term (current) use of aspirin: Secondary | ICD-10-CM | POA: Insufficient documentation

## 2015-08-14 DIAGNOSIS — I2510 Atherosclerotic heart disease of native coronary artery without angina pectoris: Secondary | ICD-10-CM | POA: Diagnosis not present

## 2015-08-14 DIAGNOSIS — Z79899 Other long term (current) drug therapy: Secondary | ICD-10-CM | POA: Insufficient documentation

## 2015-08-14 DIAGNOSIS — I10 Essential (primary) hypertension: Secondary | ICD-10-CM | POA: Diagnosis not present

## 2015-08-14 DIAGNOSIS — K3189 Other diseases of stomach and duodenum: Secondary | ICD-10-CM

## 2015-08-14 HISTORY — PX: ESOPHAGOGASTRODUODENOSCOPY: SHX5428

## 2015-08-14 SURGERY — EGD (ESOPHAGOGASTRODUODENOSCOPY)
Anesthesia: Moderate Sedation

## 2015-08-14 MED ORDER — SODIUM CHLORIDE 0.9 % IV SOLN
INTRAVENOUS | Status: DC
  Administered 2015-08-14: 13:00:00 via INTRAVENOUS

## 2015-08-14 MED ORDER — LIDOCAINE VISCOUS 2 % MT SOLN
OROMUCOSAL | Status: AC
  Filled 2015-08-14: qty 15

## 2015-08-14 MED ORDER — ONDANSETRON HCL 4 MG/2ML IJ SOLN
INTRAMUSCULAR | Status: DC | PRN
  Administered 2015-08-14: 4 mg via INTRAVENOUS

## 2015-08-14 MED ORDER — STERILE WATER FOR IRRIGATION IR SOLN
Status: DC | PRN
  Administered 2015-08-14: 14:00:00

## 2015-08-14 MED ORDER — MEPERIDINE HCL 100 MG/ML IJ SOLN
INTRAMUSCULAR | Status: AC
  Filled 2015-08-14: qty 2

## 2015-08-14 MED ORDER — ONDANSETRON HCL 4 MG/2ML IJ SOLN
INTRAMUSCULAR | Status: AC
  Filled 2015-08-14: qty 2

## 2015-08-14 MED ORDER — MIDAZOLAM HCL 5 MG/5ML IJ SOLN
INTRAMUSCULAR | Status: AC
  Filled 2015-08-14: qty 10

## 2015-08-14 MED ORDER — MIDAZOLAM HCL 5 MG/5ML IJ SOLN
INTRAMUSCULAR | Status: DC | PRN
  Administered 2015-08-14 (×4): 1 mg via INTRAVENOUS

## 2015-08-14 MED ORDER — MEPERIDINE HCL 100 MG/ML IJ SOLN
INTRAMUSCULAR | Status: DC | PRN
  Administered 2015-08-14 (×2): 25 mg via INTRAVENOUS

## 2015-08-14 MED ORDER — LIDOCAINE VISCOUS 2 % MT SOLN
OROMUCOSAL | Status: DC | PRN
  Administered 2015-08-14: 3 mL via OROMUCOSAL

## 2015-08-14 NOTE — Op Note (Signed)
[facility] Patient Name: Courtney Zuniga Procedure Date: 08/14/2015 1:34 PM MRN: [id number] Date of Birth: 13-Aug-1950 Attending MD: Norvel Richards , MD CSN: [id number] Age: 65 Admit Type: Outpatient Procedure:                Upper GI endoscopy; epigastric pain; gastric nodule                            on CT Indications:              Abnormal CT of the GI tract Providers:                Norvel Richards, MD, Gwenlyn Fudge, RN, Isabella Stalling, Technician Referring MD:              Medicines:                Midazolam 4 mg IV, Meperidine 50 mg IV, Ondansetron                            4 mg IV Complications:            No immediate complications. Estimated Blood Loss:     Estimated blood loss was minimal. Procedure:                Pre-Anesthesia Assessment:                           - Prior to the procedure, a History and Physical                            was performed, and patient medications and                            allergies were reviewed. The patient's tolerance of                            previous anesthesia was also reviewed. The risks                            and benefits of the procedure and the sedation                            options and risks were discussed with the patient.                            All questions were answered, and informed consent                            was obtained. Prior Anticoagulants: The patient has                            taken no previous anticoagulant or antiplatelet  agents. ASA Grade Assessment: II - A patient with                            mild systemic disease. After reviewing the risks                            and benefits, the patient was deemed in                            satisfactory condition to undergo the procedure.                           After obtaining informed consent, the endoscope was                            passed under direct  vision. Throughout the                            procedure, the patient's blood pressure, pulse, and                            oxygen saturations were monitored continuously. The                            EG-[id number] ([id number]) scope was introduced through the                            mouth, and advanced to the second part of duodenum.                            The upper GI endoscopy was accomplished without                            difficulty. The patient tolerated the procedure                            well. Scope In: 1:41:06 PM Scope Out: 1:50:00 PM Total Procedure Duration: 0 hours 8 minutes 54 seconds  Findings:      The examined esophagus was normal.      Multiple 4 mm erosions were found in the gastric antrum - extended into       the duodenal bulb      In the posterior proximal fundus, there appeared to be a 1.25 cm       submucosal nodule. The overlying mucosa was normal. This lesion was firm       to biopsy forceps palpation.      Biopsies of the abnormal antral mucosa were taken for histology. No       attempts at biopsying the proximal submucosal nodule were made. Impression:               - Normal esophagus. Proximal submucosal gastric                            nodule. Findings suspicious for just versus other  process.                           - Erosive gastropathy. status post biopsy.                           - Duodenal erosions without bleeding.                           - No specimens collected. Mucosal abnormality                            likely secondary to NSAID effect?"rule out H. pylori. Moderate Sedation:      Moderate (conscious) sedation was administered by the endoscopy nurse       and supervised by the endoscopist. The following parameters were       monitored: oxygen saturation, heart rate, blood pressure, respiratory       rate, EKG, adequacy of pulmonary ventilation, and response to care.       Total physician  intraservice time was 15 minutes. Recommendation:           - Patient has a contact number available for                            emergencies. The signs and symptoms of potential                            delayed complications were discussed with the                            patient. Return to normal activities tomorrow.                            Written discharge instructions were provided to the                            patient.                           - Advance diet as tolerated.                           - Continue present medications.                           - Await pathology results.                           - resume Protonix 40 mg twice daily. Avoid aspirin                            powders and othernonsteroidals as much as possible..                           - patient will be referred for endoscopic  ultrasound. Procedure Code(s):        --- Professional ---                           [phone], Esophagogastroduodenoscopy, flexible,                            transoral; diagnostic, including collection of                            specimen(s) by brushing or washing, when performed                            (separate procedure)                           99152, Moderate sedation services provided by the                            same physician or other qualified health care                            professional performing the diagnostic or                            therapeutic service that the sedation supports,                            requiring the presence of an independent trained                            observer to assist in the monitoring of the                            patient's level of consciousness and physiological                            status; initial 15 minutes of intraservice time,                            patient age 51 years or older Diagnosis Code(s):        --- Professional ---                            K31.89, Other diseases of stomach and duodenum                           K26.9, Duodenal ulcer, unspecified as acute or                            chronic, without hemorrhage or perforation                           R93.3, Abnormal findings on diagnostic imaging of  other parts of digestive tract CPT copyright 2016 American [employer] Association. All rights reserved. The codes documented in this report are preliminary and upon coder review may  be revised to meet current compliance requirements. Cristopher Estimable. Maree Ainley, MD Norvel Richards, MD 08/14/2015 7:78:24 PM This report has been signed electronically. Number of Addenda: 0

## 2015-08-14 NOTE — Telephone Encounter (Signed)
-----   Message from Daneil Dolin, MD sent at 08/14/2015  2:09 PM EDT ----- Patient needs an endoscopic ultrasound with Dr. Ardis Hughs to further evaluate submucosal gastric nodule confirmed at EGD today.

## 2015-08-14 NOTE — Telephone Encounter (Signed)
Routing to [place] to set up appt.

## 2015-08-14 NOTE — H&P (View-Only) (Signed)
Primary Care Physician:  Barry Dienes, NP Primary Gastroenterologist:  Dr. Gala Romney  Chief Complaint  Patient presents with  . Follow-up    er    HPI:   Courtney Zuniga is a 65 y.o. female who presents on referral from the emergency department for follow-up on abdominal pain. She was seen in the emergency department 07/13/2015 for abdominal pain. At that time the pain was described as epigastric, aching, radiating to the back which is constant with waxing and waning severity and progression. Denied alcohol use at that time. Notes at that time she was on Protonix. CT of the abdomen and pelvis with contrast was ordered which found a 1.5 cm filling defect on the posterior proximal stomach suspicious for gastric mass/GI stromal tumor. Recommended direct visualization. No   Today she states she's still abdominal pain in her mid abdomen, sharp, radiates to her back. When she "gets to coughing it feels like something balls up on the inside." Takes about 6 BC powders a day. Minimal NSAIDs as well. Some nausea as well, improved with Zofran. No vomiting. Is on Protonix 20 mg daily. Denies hematochezia and melena. Has had cholecystomy. Denies fever, chills. Has lost subjectively 15 pounds in the past 1-2 months. Appetite isn't as good lately. Last colonoscopy 2 years ago in [place] at [facility]. She was told it was clean. Denies chest pain, dyspnea, dizziness, lightheadedness, syncope, near syncope. Denies any other upper or lower GI symptoms.  Past Medical History  Diagnosis Date  . Brain tumor (benign) ([place]) 2005 [facility]    Benign  . Hypertension   . Asthma   . Chronic headaches   . Chronic hip pain   . Chronic pain   . NSTEMI (non-ST elevated myocardial infarction) ([place])   . Coronary artery disease   . On home O2     qhs  . COPD (chronic obstructive pulmonary disease) [facility])     Past Surgical History  Procedure Laterality Date  . Cholecystectomy    . Tumor removal      Benign     Current Outpatient Prescriptions  Medication Sig Dispense Refill  . albuterol (PROVENTIL HFA;VENTOLIN HFA) 108 (90 BASE) MCG/ACT inhaler Inhale 2 puffs into the lungs every 6 (six) hours as needed. Shortness of Breath    . albuterol (PROVENTIL) (2.5 MG/3ML) 0.083% nebulizer solution Take 2.5 mg by nebulization every 6 (six) hours as needed for wheezing or shortness of breath.    Marland Kitchen aspirin EC 81 MG tablet Take 81 mg by mouth daily.    . Aspirin-Acetaminophen-Caffeine 260-130-16 MG TABS Take 1 packet by mouth every 4 (four) hours as needed (Pain).    . calcium-vitamin D 250-100 MG-UNIT per tablet Take 1 tablet by mouth daily.    Marland Kitchen dexamethasone (DECADRON) 4 MG tablet Take 1 tablet (4 mg total) by mouth 2 (two) times daily with a meal. 12 tablet 0  . dextromethorphan-guaiFENesin (MUCINEX DM) 30-600 MG per 12 hr tablet Take 1 tablet by mouth 2 (two) times daily as needed (congestion).    . DULoxetine (CYMBALTA) 30 MG capsule Take 30 mg by mouth 2 (two) times daily.     . Fluticasone-Salmeterol (ADVAIR) 500-50 MCG/DOSE AEPB Inhale 1 puff into the lungs every 12 (twelve) hours.    Marland Kitchen HYDROcodone-acetaminophen (NORCO/VICODIN) 5-325 MG tablet Take 1 tablet by mouth every 6 (six) hours as needed. 20 tablet 0  . isosorbide mononitrate (IMDUR) 60 MG 24 hr tablet Take 60 mg by mouth daily.     Marland Kitchen  naproxen (NAPROSYN) 500 MG tablet Take 1 tablet (500 mg total) by mouth 2 (two) times daily as needed for mild pain or moderate pain. 30 tablet 0  . nitroGLYCERIN (NITROSTAT) 0.4 MG SL tablet Place 0.4 mg under the tongue every 5 (five) minutes as needed for chest pain.     Marland Kitchen ondansetron (ZOFRAN ODT) 4 MG disintegrating tablet '4mg'$  ODT q4 hours prn nausea/vomit 12 tablet 0  . QVAR 80 MCG/ACT inhaler Inhale 1 puff into the lungs 2 (two) times daily.     Marland Kitchen azithromycin (ZITHROMAX) 250 MG tablet Take 2 tablets PO day 1, then 1 tab PO daily x4 days. (Patient not taking: Reported on 08/08/2015) 6 tablet 0  . gabapentin  (NEURONTIN) 300 MG capsule Take 300 mg by mouth 3 (three) times daily. Reported on 08/08/2015    . pantoprazole (PROTONIX) 20 MG tablet Take 1 tablet (20 mg total) by mouth daily. (Patient not taking: Reported on 08/08/2015) 30 tablet 0  . predniSONE (DELTASONE) 10 MG tablet Take 1 tablet (10 mg total) by mouth daily. (Patient not taking: Reported on 08/08/2015) 60 tablet 0  . predniSONE (DELTASONE) 20 MG tablet Take 2 tablets (40 mg total) by mouth daily. (Patient not taking: Reported on 08/08/2015) 10 tablet 0   No current facility-administered medications for this visit.    Allergies as of 08/08/2015  . (No Known Allergies)    Family History  Problem Relation Age of Onset  . Cancer Mother   . Cancer Father   . Cancer Sister     Social History   Social History  . Marital Status: Legally Separated    Spouse Name: N/A  . Number of Children: N/A  . Years of Education: 10th grade   Occupational History  . retired    Social History Main Topics  . Smoking status: Current Every Day Smoker -- 0.50 packs/day    Types: Cigarettes  . Smokeless tobacco: Not on file  . Alcohol Use: No  . Drug Use: No  . Sexual Activity: No   Other Topics Concern  . Not on file   Social History Narrative    Review of Systems: General: Negative for fever, chills, fatigue, weakness. ENT: Negative for hoarseness, difficulty swallowing. CV: Negative for chest pain, angina, palpitations,  peripheral edema.  Respiratory: Negative for dyspnea at rest, worsening cough, sputum, wheezing.  GI: See history of present illness. MS: Negative for joint pain, low back pain.  Derm: Negative for rash or itching.  Endo: Positive for unusual weight change.  Heme: Negative for bruising or bleeding. Allergy: Negative for rash or hives.    Physical Exam: BP 119/80 mmHg  Pulse 82  Temp(Src) 98.2 F (36.8 C)  Ht '5\' 7"'$  (1.702 m)  Wt 140 lb 9.6 oz (63.776 kg)  BMI 22.02 kg/m2 General:   Alert and oriented.  Pleasant and cooperative. Well-nourished and well-developed. Very thin female with some ribs visible in her upper chest. Head:  Normocephalic and atraumatic. Eyes:  Without icterus, sclera clear and conjunctiva pink.  Cardiovascular:  S1, S2 present without murmurs appreciated. Normal bilateral DP pulses noted. Extremities without clubbing or edema. Respiratory:  Clear to auscultation bilaterally. No wheezes, rales, or rhonchi. No distress.  Gastrointestinal:  +BS, soft, and non-distended. Moderate epigastric and LUQ abdominal pain. No HSM noted. No guarding or rebound. No masses appreciated.  Rectal:  Deferred  Musculoskalatal:  Symmetrical without gross deformities. Skin:  Intact without significant lesions or rashes. Neurologic:  Alert and oriented x4;  grossly normal neurologically. Psych:  Alert and cooperative. Normal mood and affect. Heme/Lymph/Immune: No excessive bruising noted.    08/08/2015 9:29 AM   Disclaimer: This note was dictated with voice recognition software. Similar sounding words can inadvertently be transcribed and may not be corrected upon review.

## 2015-08-14 NOTE — Telephone Encounter (Signed)
Referral sent 

## 2015-08-14 NOTE — Interval H&P Note (Signed)
History and Physical Interval Note:  08/14/2015 1:31 PM  Courtney Zuniga  has presented today for surgery, with the diagnosis of abnormal CT  The various methods of treatment have been discussed with the patient and family. After consideration of risks, benefits and other options for treatment, the patient has consented to  Procedure(s) with comments: ESOPHAGOGASTRODUODENOSCOPY (EGD) (N/A) - 215  as a surgical intervention .  The patient's history has been reviewed, patient examined, no change in status, stable for surgery.  I have reviewed the patient's chart and labs.  Questions were answered to the patient's satisfaction.     Manus Rudd  Patient denies dysphagia. CT images reviewed. No change. EGD today per plan.The risks, benefits, limitations, alternatives and imponderables have been reviewed with the patient. Potential for esophageal dilation, biopsy, etc. have also been reviewed.  Questions have been answered. All parties agreeable.

## 2015-08-14 NOTE — Discharge Instructions (Addendum)
EGD Discharge instructions Please read the instructions outlined below and refer to this sheet in the next few weeks. These discharge instructions provide you with general information on caring for yourself after you leave the hospital. Your doctor may also give you specific instructions. While your treatment has been planned according to the most current medical practices available, unavoidable complications occasionally occur. If you have any problems or questions after discharge, please call your doctor. ACTIVITY  You may resume your regular activity but move at a slower pace for the next 24 hours.   Take frequent rest periods for the next 24 hours.   Walking will help expel (get rid of) the air and reduce the bloated feeling in your abdomen.   No driving for 24 hours (because of the anesthesia (medicine) used during the test).   You may shower.   Do not sign any important legal documents or operate any machinery for 24 hours (because of the anesthesia used during the test).  NUTRITION  Drink plenty of fluids.   You may resume your normal diet.   Begin with a light meal and progress to your normal diet.   Avoid alcoholic beverages for 24 hours or as instructed by your caregiver.  MEDICATIONS  You may resume your normal medications unless your caregiver tells you otherwise.  WHAT YOU CAN EXPECT TODAY  You may experience abdominal discomfort such as a feeling of fullness or gas pains.  FOLLOW-UP  Your doctor will discuss the results of your test with you.  SEEK IMMEDIATE MEDICAL ATTENTION IF ANY OF THE FOLLOWING OCCUR:  Excessive nausea (feeling sick to your stomach) and/or vomiting.   Severe abdominal pain and distention (swelling).   Trouble swallowing.   Temperature over 101 F (37.8 C).   Rectal bleeding or vomiting of blood.    Avoid aspirin-containing powders and NSAIDs like Naprosyn and Advil as much as possible  Continue Protonix 40 mg daily  Further  recommendations to follow pending review of pathology report  You have a stomach nodule.  This needs to be evaluated further with a test called an endoscopic ultrasound in [place]. My office will set that test up.

## 2015-08-15 ENCOUNTER — Telehealth: Payer: Self-pay | Admitting: Gastroenterology

## 2015-08-15 ENCOUNTER — Other Ambulatory Visit: Payer: Self-pay

## 2015-08-15 DIAGNOSIS — K3189 Other diseases of stomach and duodenum: Secondary | ICD-10-CM

## 2015-08-15 NOTE — Telephone Encounter (Signed)
Happy to help  Courtney Zuniga, She needs upper EUS, radial +/- linear with MAC sedation, next available EUS Thursday.  For gastric nodule.  Thanks

## 2015-08-15 NOTE — Telephone Encounter (Signed)
No answer and no voice mail.

## 2015-08-16 NOTE — Telephone Encounter (Signed)
EUS scheduled, pt instructed and medications reviewed.  Patient instructions mailed to home.  Patient to call with any questions or concerns.  

## 2015-08-16 NOTE — Telephone Encounter (Signed)
Left message on machine to call back on daughters number, the pt number does not have voice mail set up.  Instructions were mailed to the home

## 2015-08-17 ENCOUNTER — Encounter (HOSPITAL_COMMUNITY): Payer: Self-pay | Admitting: Internal Medicine

## 2015-08-18 ENCOUNTER — Encounter: Payer: Self-pay | Admitting: Internal Medicine

## 2015-09-03 ENCOUNTER — Encounter (HOSPITAL_COMMUNITY): Payer: Self-pay | Admitting: *Deleted

## 2015-09-04 ENCOUNTER — Other Ambulatory Visit: Payer: Self-pay

## 2015-09-04 ENCOUNTER — Encounter (HOSPITAL_COMMUNITY): Payer: Self-pay | Admitting: *Deleted

## 2015-09-04 ENCOUNTER — Emergency Department (HOSPITAL_COMMUNITY): Payer: Medicare HMO

## 2015-09-04 ENCOUNTER — Emergency Department (HOSPITAL_COMMUNITY)
Admission: EM | Admit: 2015-09-04 | Discharge: 2015-09-05 | Disposition: A | Discharge status: Home / Self Care | Payer: Medicare HMO | Attending: Emergency Medicine | Admitting: Emergency Medicine

## 2015-09-04 DIAGNOSIS — R519 Headache, unspecified: Secondary | ICD-10-CM

## 2015-09-04 DIAGNOSIS — F17210 Nicotine dependence, cigarettes, uncomplicated: Secondary | ICD-10-CM | POA: Insufficient documentation

## 2015-09-04 DIAGNOSIS — Z86011 Personal history of benign neoplasm of the brain: Secondary | ICD-10-CM | POA: Diagnosis not present

## 2015-09-04 DIAGNOSIS — J449 Chronic obstructive pulmonary disease, unspecified: Secondary | ICD-10-CM | POA: Diagnosis not present

## 2015-09-04 DIAGNOSIS — R51 Headache: Secondary | ICD-10-CM | POA: Insufficient documentation

## 2015-09-04 DIAGNOSIS — J45909 Unspecified asthma, uncomplicated: Secondary | ICD-10-CM | POA: Diagnosis not present

## 2015-09-04 DIAGNOSIS — I10 Essential (primary) hypertension: Secondary | ICD-10-CM | POA: Diagnosis not present

## 2015-09-04 DIAGNOSIS — I2510 Atherosclerotic heart disease of native coronary artery without angina pectoris: Secondary | ICD-10-CM | POA: Diagnosis not present

## 2015-09-04 DIAGNOSIS — Z7982 Long term (current) use of aspirin: Secondary | ICD-10-CM | POA: Insufficient documentation

## 2015-09-04 LAB — COMPREHENSIVE METABOLIC PANEL
ALBUMIN: 4.4 g/dL (ref 3.5–5.0)
ALT: 12 U/L — ABNORMAL LOW (ref 14–54)
ANION GAP: 9 (ref 5–15)
AST: 21 U/L (ref 15–41)
Alkaline Phosphatase: 224 U/L — ABNORMAL HIGH (ref 38–126)
BILIRUBIN TOTAL: 0.2 mg/dL — AB (ref 0.3–1.2)
BUN: 10 mg/dL (ref 6–20)
CO2: 27 mmol/L (ref 22–32)
Calcium: 9.6 mg/dL (ref 8.9–10.3)
Chloride: 106 mmol/L (ref 101–111)
Creatinine, Ser: 0.66 mg/dL (ref 0.44–1.00)
Glucose, Bld: 109 mg/dL — ABNORMAL HIGH (ref 65–99)
POTASSIUM: 3.5 mmol/L (ref 3.5–5.1)
Sodium: 142 mmol/L (ref 135–145)
TOTAL PROTEIN: 7.5 g/dL (ref 6.5–8.1)

## 2015-09-04 LAB — CBC WITH DIFFERENTIAL/PLATELET
BASOS PCT: 0 %
Basophils Absolute: 0 10*3/uL (ref 0.0–0.1)
Eosinophils Absolute: 0.1 10*3/uL (ref 0.0–0.7)
Eosinophils Relative: 1 %
HEMATOCRIT: 44.5 % (ref 36.0–46.0)
Hemoglobin: 15.7 g/dL — ABNORMAL HIGH (ref 12.0–15.0)
Lymphocytes Relative: 43 %
Lymphs Abs: 3.6 10*3/uL (ref 0.7–4.0)
MCH: 29.7 pg (ref 26.0–34.0)
MCHC: 35.3 g/dL (ref 30.0–36.0)
MCV: 84.1 fL (ref 78.0–100.0)
MONO ABS: 0.5 10*3/uL (ref 0.1–1.0)
MONOS PCT: 6 %
NEUTROS ABS: 4.1 10*3/uL (ref 1.7–7.7)
Neutrophils Relative %: 50 %
Platelets: 208 10*3/uL (ref 150–400)
RBC: 5.29 MIL/uL — ABNORMAL HIGH (ref 3.87–5.11)
RDW: 14.6 % (ref 11.5–15.5)
WBC: 8.4 10*3/uL (ref 4.0–10.5)

## 2015-09-04 LAB — PROTIME-INR
INR: 1 (ref 0.00–1.49)
PROTHROMBIN TIME: 13.4 s (ref 11.6–15.2)

## 2015-09-04 MED ORDER — FENTANYL CITRATE (PF) 100 MCG/2ML IJ SOLN
100.0000 ug | Freq: Once | INTRAMUSCULAR | Status: AC
  Administered 2015-09-04: 100 ug via INTRAVENOUS
  Filled 2015-09-04: qty 2

## 2015-09-04 MED ORDER — DIPHENHYDRAMINE HCL 50 MG/ML IJ SOLN
25.0000 mg | Freq: Once | INTRAMUSCULAR | Status: AC
Start: 2015-09-04 — End: 2015-09-04
  Administered 2015-09-04: 25 mg via INTRAVENOUS
  Filled 2015-09-04: qty 1

## 2015-09-04 MED ORDER — SODIUM CHLORIDE 0.9 % IV BOLUS (SEPSIS)
1000.0000 mL | Freq: Once | INTRAVENOUS | Status: AC
  Administered 2015-09-04: 1000 mL via INTRAVENOUS

## 2015-09-04 MED ORDER — METOCLOPRAMIDE HCL 5 MG/ML IJ SOLN
10.0000 mg | Freq: Once | INTRAMUSCULAR | Status: AC
Start: 2015-09-04 — End: 2015-09-04
  Administered 2015-09-04: 10 mg via INTRAVENOUS
  Filled 2015-09-04: qty 2

## 2015-09-04 NOTE — ED Provider Notes (Addendum)
CSN: [id number]     Arrival date & time 09/04/15  2202 History  By signing my name below, I, Courtney Zuniga, attest that this documentation has been prepared under the direction and in the presence of Courtney Gambler, MD.   Electronically Signed: Nicole Zuniga, ED Scribe. 09/04/2015. 10:24 PM   Chief Complaint  Patient presents with  . Headache    The history is provided by the patient and a relative. No language interpreter was used.   HPI Comments: Courtney Zuniga is a 65 y.o. female with PMHx of benign brain tumor, HTN, asthma, chronic headache, chronic pain, NSTEMI, COPD, and CAD who presents to the Emergency Department complaining of gradual onset, constant, headache, ongoing for two days. Daughter reports pt measured her BP today and it was 142/102 prompting her to come. Pt reports some neck pain, dizziness, visual disturbance, and right arm numbness, beginning today. Her numbness has alleviated since arriving to the ED. No other associated symptoms noted. No worsening or alleviating factors noted. Daughter denies facial droop, speech difficulty, weakness, nausea, vomiting, fevers, chills, chest pain, or any other pertinent symptoms.   Past Medical History  Diagnosis Date  . Brain tumor (benign) ([place]) 2005 [facility]    Benign  . Hypertension   . Asthma   . Chronic headaches   . Chronic hip pain   . Chronic pain   . NSTEMI (non-ST elevated myocardial infarction) ([place])   . Coronary artery disease   . On home O2     qhs  . COPD (chronic obstructive pulmonary disease) [facility])    Past Surgical History  Procedure Laterality Date  . Cholecystectomy    . Tumor removal  2005    Benign  . Colonoscopy  2015    Results requested from [facility]  . Esophagogastroduodenoscopy N/A 08/14/2015    Procedure: ESOPHAGOGASTRODUODENOSCOPY (EGD);  Surgeon: Courtney Dolin, MD;  Location: [facility] END[facility];  Service: Endoscopy;  Laterality: N/A;  215    Family History  Problem Relation  Age of Onset  . Ovarian cancer Mother   . Colon cancer Father   . Brain cancer Sister     Half sister  . Colon cancer Brother   . Pancreatic cancer Brother   . Lung cancer Brother    Social History  Substance Use Topics  . Smoking status: Current Every Day Smoker -- 0.50 packs/day    Types: Cigarettes  . Smokeless tobacco: Never Used  . Alcohol Use: No   OB History    No data available     Review of Systems  Constitutional: Negative for chills.  Eyes: Positive for visual disturbance.  Cardiovascular: Negative for chest pain.  Neurological: Negative for speech difficulty.  All other systems reviewed and are negative.   Allergies  Review of patient's allergies indicates no known allergies.  Home Medications   Prior to Admission medications   Medication Sig Start Date End Date Taking? Authorizing Provider  albuterol (PROVENTIL HFA;VENTOLIN HFA) 108 (90 BASE) MCG/ACT inhaler Inhale 2 puffs into the lungs every 6 (six) hours as needed for shortness of breath.     Historical Provider, MD  albuterol (PROVENTIL) (2.5 MG/3ML) 0.083% nebulizer solution Take 2.5 mg by nebulization every 6 (six) hours as needed for wheezing or shortness of breath.    Historical Provider, MD  aspirin EC 81 MG tablet Take 81 mg by mouth daily.    Historical Provider, MD  Aspirin-Caffeine 845-65 MG PACK Take 1 packet by mouth every 6 (six)  hours as needed (For headache or pain.).    Historical Provider, MD  calcium-vitamin D 250-100 MG-UNIT per tablet Take 1 tablet by mouth daily.    Historical Provider, MD  dexamethasone (DECADRON) 4 MG tablet Take 1 tablet (4 mg total) by mouth 2 (two) times daily with a meal. 01/07/15   Courtney Kocher, PA-C  dextromethorphan-guaiFENesin [facility] DM) 30-600 MG per 12 hr tablet Take 1 tablet by mouth 2 (two) times daily as needed (congestion).    Historical Provider, MD  DULoxetine (CYMBALTA) 30 MG capsule Take 30 mg by mouth 2 (two) times daily.  09/22/13   Historical  Provider, MD  Fluticasone-Salmeterol (ADVAIR) 500-50 MCG/DOSE AEPB Inhale 1 puff into the lungs every 12 (twelve) hours.    Historical Provider, MD  HYDROcodone-acetaminophen (NORCO/VICODIN) 5-325 MG tablet Take 1 tablet by mouth every 6 (six) hours as needed. Patient taking differently: Take 1 tablet by mouth every 6 (six) hours as needed (For pain.).  07/13/15   Courtney Ferguson, MD  isosorbide mononitrate (IMDUR) 60 MG 24 hr tablet Take 60 mg by mouth daily.  09/22/13   Historical Provider, MD  Menthol, Topical Analgesic, (ICY HOT EX) Apply 1 application topically daily as needed (For pain.).    Historical Provider, MD  nitroGLYCERIN (NITROSTAT) 0.4 MG SL tablet Place 0.4 mg under the tongue every 5 (five) minutes as needed for chest pain.  02/15/13   Historical Provider, MD  ondansetron (ZOFRAN ODT) 4 MG disintegrating tablet '4mg'$  ODT q4 hours prn nausea/vomit Patient taking differently: Take 4 mg by mouth every 4 (four) hours as needed for nausea or vomiting.  07/13/15   Courtney Ferguson, MD  pantoprazole (PROTONIX) 20 MG tablet Take 20 mg by mouth daily. 07/14/15   Historical Provider, MD  QVAR 80 MCG/ACT inhaler Inhale 1 puff into the lungs 2 (two) times daily.  02/13/14   Historical Provider, MD  Tiotropium Bromide-Olodaterol (STIOLTO RESPIMAT) 2.5-2.5 MCG/ACT AERS Inhale 1 puff into the lungs daily.    Historical Provider, MD   BP 157/98 mmHg  Pulse 96  Temp(Src) 99 F (37.2 C) (Oral)  Resp 26  Ht '5\' 7"'$  (1.702 m)  Wt 146 lb (66.225 kg)  BMI 22.86 kg/m2  SpO2 100% Physical Exam  Constitutional: She is oriented to person, place, and time. She appears well-developed and well-nourished.  HENT:  Head: Normocephalic and atraumatic.  Right Ear: External ear normal.  Left Ear: External ear normal.  Nose: Nose normal.  Eyes: EOM are normal. Pupils are equal, round, and reactive to light. Right eye exhibits no discharge. Left eye exhibits no discharge.  Neck: Neck supple.  Decreased active and  passive ROM of neck  Cardiovascular: Normal rate, regular rhythm and normal heart sounds.   Pulmonary/Chest: Effort normal and breath sounds normal.  Abdominal: Soft. There is no tenderness.  Neurological: She is alert and oriented to person, place, and time.  CN 3-12 grossly intact. 5/5 strength in all 4 extremities. Grossly normal sensation.  Skin: Skin is warm and dry.  Nursing note and vitals reviewed.   ED Course  Procedures (including critical care time) DIAGNOSTIC STUDIES: Oxygen Saturation is 100% on RA, normal by my interpretation.    COORDINATION OF CARE: 10:19 PM Discussed treatment plan with pt at bedside and pt agreed to plan.  Labs Review Labs Reviewed  CBC WITH DIFFERENTIAL/PLATELET - Abnormal; Notable for the following:    RBC 5.29 (*)    Hemoglobin 15.7 (*)    All other components within normal  limits  COMPREHENSIVE METABOLIC PANEL - Abnormal; Notable for the following:    Glucose, Bld 109 (*)    ALT 12 (*)    Alkaline Phosphatase 224 (*)    Total Bilirubin 0.2 (*)    All other components within normal limits  PROTIME-INR    Imaging Review Ct Head Wo Contrast  09/04/2015  CLINICAL DATA:  Acute onset of severe headache for 2 days. Intermittent right arm numbness. EXAM: CT HEAD WITHOUT CONTRAST TECHNIQUE: Contiguous axial images were obtained from the base of the skull through the vertex without intravenous contrast. COMPARISON:  CT and MRI 03/31/2014 FINDINGS: Brain: No evidence of acute infarction, hemorrhage, extra-axial collection, ventriculomegaly, or mass effect. Post resection of pituitary tumor narrowing, no evidence of sellar expansion or change from prior exam. Mild chronic small vessel ischemia, stable. Vascular: No hyperdense vessel or unexpected calcification. Skull: Bony calvarium with heterogeneous appearance, stable from prior exam. The regions of ground-glass in the frontal bones. Unchanged opacification of the right mastoid air cells.  Sinuses/Orbits: No acute findings. Other: None. IMPRESSION: 1.  No acute intracranial abnormality. 2. 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 Chronic opacification of right mastoid air cells. 3. Mild chronic small vessel ischemia, stable. Electronically Signed   By: Jeb Levering M.D.   On: 09/04/2015 23:27   I have personally reviewed and evaluated these images and lab results as part of my medical decision-making.   EKG Interpretation None      MDM   Final diagnoses:  Severe headache    Patient's CT shows no acute abnormality. Initial neuro exam normal. No signs of CVA. Headache now completely resolved. She now has full ROM of her neck. She was able to ambulate to the bathroom. Talking to patient more she has chronic almost daily headaches. This was definitely worse, hard to tell if the quality is different. I discussed that given severe headache with the neck symptoms I recommend LP to r/o meningitis or missed SAH on CT. Long discussion with patient and daughter. Patient's biggest concern is that her daughter has to go to work in AM. I discussed possible missed meningitis or SAH and that this could lead to disability or death. She understands this but does not want LP and wants to go home to f/u with PCP next day. She has decision making capacity and seems to understand my concerns. Discussed return precautions and that she can return at any time if she changes her mind.  I personally performed the services described in this documentation, which was scribed in my presence. The recorded information has been reviewed and is accurate.   Courtney Gambler, MD 09/05/15 1238  Courtney Gambler, MD 09/05/15 [phone]

## 2015-09-04 NOTE — ED Notes (Signed)
Pt c/o headache x 2 days; pt is also c/o right arm numbness that is intermittent

## 2015-09-13 ENCOUNTER — Ambulatory Visit (HOSPITAL_COMMUNITY)
Admission: RE | Admit: 2015-09-13 | Discharge: 2015-09-13 | Disposition: A | Discharge status: Home / Self Care | Payer: Medicare HMO | Source: Ambulatory Visit | Attending: Gastroenterology | Admitting: Gastroenterology

## 2015-09-13 ENCOUNTER — Ambulatory Visit (HOSPITAL_COMMUNITY): Payer: Medicare HMO | Admitting: Certified Registered Nurse Anesthetist

## 2015-09-13 ENCOUNTER — Encounter (HOSPITAL_COMMUNITY)
Admission: RE | Disposition: A | Discharge status: Home / Self Care | Payer: Self-pay | Source: Ambulatory Visit | Attending: Gastroenterology

## 2015-09-13 ENCOUNTER — Encounter (HOSPITAL_COMMUNITY): Payer: Self-pay | Admitting: *Deleted

## 2015-09-13 DIAGNOSIS — Z801 Family history of malignant neoplasm of trachea, bronchus and lung: Secondary | ICD-10-CM | POA: Insufficient documentation

## 2015-09-13 DIAGNOSIS — R1013 Epigastric pain: Secondary | ICD-10-CM | POA: Diagnosis present

## 2015-09-13 DIAGNOSIS — D490 Neoplasm of unspecified behavior of digestive system: Secondary | ICD-10-CM | POA: Diagnosis not present

## 2015-09-13 DIAGNOSIS — Z8041 Family history of malignant neoplasm of ovary: Secondary | ICD-10-CM | POA: Insufficient documentation

## 2015-09-13 DIAGNOSIS — K2950 Unspecified chronic gastritis without bleeding: Secondary | ICD-10-CM | POA: Insufficient documentation

## 2015-09-13 DIAGNOSIS — I10 Essential (primary) hypertension: Secondary | ICD-10-CM | POA: Insufficient documentation

## 2015-09-13 DIAGNOSIS — Z800 Family history of malignant neoplasm of digestive organs: Secondary | ICD-10-CM | POA: Insufficient documentation

## 2015-09-13 DIAGNOSIS — K3189 Other diseases of stomach and duodenum: Secondary | ICD-10-CM | POA: Diagnosis not present

## 2015-09-13 DIAGNOSIS — Z808 Family history of malignant neoplasm of other organs or systems: Secondary | ICD-10-CM | POA: Insufficient documentation

## 2015-09-13 DIAGNOSIS — F17210 Nicotine dependence, cigarettes, uncomplicated: Secondary | ICD-10-CM | POA: Diagnosis not present

## 2015-09-13 DIAGNOSIS — Z9981 Dependence on supplemental oxygen: Secondary | ICD-10-CM | POA: Diagnosis not present

## 2015-09-13 DIAGNOSIS — J449 Chronic obstructive pulmonary disease, unspecified: Secondary | ICD-10-CM | POA: Insufficient documentation

## 2015-09-13 DIAGNOSIS — I252 Old myocardial infarction: Secondary | ICD-10-CM | POA: Diagnosis not present

## 2015-09-13 DIAGNOSIS — K449 Diaphragmatic hernia without obstruction or gangrene: Secondary | ICD-10-CM | POA: Insufficient documentation

## 2015-09-13 DIAGNOSIS — I2510 Atherosclerotic heart disease of native coronary artery without angina pectoris: Secondary | ICD-10-CM | POA: Diagnosis not present

## 2015-09-13 DIAGNOSIS — R933 Abnormal findings on diagnostic imaging of other parts of digestive tract: Secondary | ICD-10-CM

## 2015-09-13 HISTORY — PX: UPPER ESOPHAGEAL ENDOSCOPIC ULTRASOUND (EUS): SHX6562

## 2015-09-13 HISTORY — PX: ESOPHAGOGASTRODUODENOSCOPY (EGD) WITH PROPOFOL: SHX5813

## 2015-09-13 SURGERY — ESOPHAGOGASTRODUODENOSCOPY (EGD) WITH PROPOFOL
Anesthesia: Monitor Anesthesia Care

## 2015-09-13 MED ORDER — PROPOFOL 500 MG/50ML IV EMUL
INTRAVENOUS | Status: DC | PRN
  Administered 2015-09-13: 50 ug/kg/min via INTRAVENOUS

## 2015-09-13 MED ORDER — PROPOFOL 10 MG/ML IV BOLUS
INTRAVENOUS | Status: DC | PRN
  Administered 2015-09-13: 30 mg via INTRAVENOUS
  Administered 2015-09-13 (×3): 50 mg via INTRAVENOUS

## 2015-09-13 MED ORDER — PHENYLEPHRINE 40 MCG/ML (10ML) SYRINGE FOR IV PUSH (FOR BLOOD PRESSURE SUPPORT)
PREFILLED_SYRINGE | INTRAVENOUS | Status: AC
  Filled 2015-09-13: qty 10

## 2015-09-13 MED ORDER — SODIUM CHLORIDE 0.9 % IV SOLN: INTRAVENOUS | Status: DC

## 2015-09-13 MED ORDER — LIDOCAINE 2% (20 MG/ML) 5 ML SYRINGE
INTRAMUSCULAR | Status: DC | PRN
  Administered 2015-09-13: 100 mg via INTRAVENOUS

## 2015-09-13 MED ORDER — PHENYLEPHRINE 40 MCG/ML (10ML) SYRINGE FOR IV PUSH (FOR BLOOD PRESSURE SUPPORT)
PREFILLED_SYRINGE | INTRAVENOUS | Status: DC | PRN
  Administered 2015-09-13: 80 ug via INTRAVENOUS

## 2015-09-13 MED ORDER — PROPOFOL 10 MG/ML IV BOLUS
INTRAVENOUS | Status: AC
  Filled 2015-09-13: qty 40

## 2015-09-13 MED ORDER — LACTATED RINGERS IV SOLN
INTRAVENOUS | Status: DC
  Administered 2015-09-13: 1000 mL via INTRAVENOUS

## 2015-09-13 MED ORDER — LIDOCAINE HCL (CARDIAC) 20 MG/ML IV SOLN
INTRAVENOUS | Status: AC
  Filled 2015-09-13: qty 5

## 2015-09-13 NOTE — H&P (Signed)
  HPI: This is a woman with epig pain, weight loss found to have gastric wall mass on CT scan  EGD Dr. Gala Romney described submucosal lesion, biopsies normal     Past Medical History  Diagnosis Date  . Brain tumor (benign) ([place]) 2005 [facility]    Benign  . Hypertension   . Asthma   . Chronic headaches   . Chronic hip pain   . Chronic pain   . NSTEMI (non-ST elevated myocardial infarction) ([place])   . Coronary artery disease   . On home O2     qhs  . COPD (chronic obstructive pulmonary disease) [facility])     Past Surgical History  Procedure Laterality Date  . Cholecystectomy    . Tumor removal  2005    Benign  . Colonoscopy  2015    Results requested from [facility]  . Esophagogastroduodenoscopy N/A 08/14/2015    Procedure: ESOPHAGOGASTRODUODENOSCOPY (EGD);  Surgeon: Daneil Dolin, MD;  Location: [facility] ENDO SU[facility];  Service: Endoscopy;  Laterality: N/A;  215     Current Facility-Administered Medications  Medication Dose Route Frequency Provider Last Rate Last Dose  . 0.9 %  sodium chloride infusion   Intravenous Continuous Milus Banister, MD      . lactated ringers infusion   Intravenous Continuous Milus Banister, MD 10 mL/hr at 09/13/15 0918 1,000 mL at 09/13/15 0918    Allergies as of 08/15/2015  . (No Known Allergies)    Family History  Problem Relation Age of Onset  . Ovarian cancer Mother   . Colon cancer Father   . Brain cancer Sister     Half sister  . Colon cancer Brother   . Pancreatic cancer Brother   . Lung cancer Brother     Social History   Social History  . Marital Status: Legally Separated    Spouse Name: N/A  . Number of Children: N/A  . Years of Education: 10th grade   Occupational History  . retired    Social History Main Topics  . Smoking status: Current Every Day Smoker -- 0.50 packs/day    Types: Cigarettes  . Smokeless tobacco: Never Used  . Alcohol Use: No  . Drug Use: No  . Sexual Activity: No   Other Topics Concern  .  Not on file   Social History Narrative     Physical Exam: BP 158/95 mmHg  Temp(Src) 98.5 F (36.9 C) (Oral)  Resp 28  SpO2 97% Constitutional: generally well-appearing Psychiatric: alert and oriented x3 Abdomen: soft, nontender, nondistended, no obvious ascites, no peritoneal signs, normal bowel sounds   Assessment and plan: 65 y.o. female with gastric wall lesion, mass For upper EUS today   Owens Loffler, MD [facility] 09/13/2015, 9:19 AM

## 2015-09-13 NOTE — Anesthesia Preprocedure Evaluation (Signed)
Anesthesia Evaluation  Patient identified by MRN, date of birth, ID band Patient awake    Reviewed: Allergy & Precautions, NPO status , Patient's Chart, lab work & pertinent test results  Airway Mallampati: II  TM Distance: >3 FB Neck ROM: Full    Dental no notable dental hx.    Pulmonary asthma , COPD,  oxygen dependent, Current Smoker,    Pulmonary exam normal breath sounds clear to auscultation       Cardiovascular hypertension, Pt. on medications + CAD and + Past MI  Normal cardiovascular exam Rhythm:Regular Rate:Normal     Neuro/Psych negative neurological ROS  negative psych ROS   GI/Hepatic negative GI ROS, Neg liver ROS,   Endo/Other  negative endocrine ROS  Renal/GU negative Renal ROS  negative genitourinary   Musculoskeletal negative musculoskeletal ROS (+)   Abdominal   Peds negative pediatric ROS (+)  Hematology negative hematology ROS (+)   Anesthesia Other Findings   Reproductive/Obstetrics negative OB ROS                             Anesthesia Physical Anesthesia Plan  ASA: III  Anesthesia Plan: MAC   Post-op Pain Management:    Induction: Intravenous  Airway Management Planned: Simple Face Mask  Additional Equipment:   Intra-op Plan:   Post-operative Plan:   Informed Consent: I have reviewed the patients History and Physical, chart, labs and discussed the procedure including the risks, benefits and alternatives for the proposed anesthesia with the patient or authorized representative who has indicated his/her understanding and acceptance.   Dental advisory given  Plan Discussed with: CRNA  Anesthesia Plan Comments:         Anesthesia Quick Evaluation

## 2015-09-13 NOTE — Anesthesia Postprocedure Evaluation (Signed)
Anesthesia Post Note  Patient: Courtney Zuniga  Procedure(s) Performed: Procedure(s) (LRB): UPPER ENDOSCOPIC ULTRASOUND (EUS) LINEAR (N/A)  Patient location during evaluation: Endoscopy Anesthesia Type: MAC Level of consciousness: awake and alert Pain management: pain level controlled Vital Signs Assessment: post-procedure vital signs reviewed and stable Respiratory status: spontaneous breathing, nonlabored ventilation, respiratory function stable and patient connected to nasal cannula oxygen Cardiovascular status: stable and blood pressure returned to baseline Anesthetic complications: no    Last Vitals:  Filed Vitals:   09/13/15 1010 09/13/15 1020  BP: 120/77 148/94  Pulse: 81 76  Temp:    Resp: 26 14    Last Pain: There were no vitals filed for this visit.               Montez Hageman

## 2015-09-13 NOTE — Op Note (Signed)
[facility] Patient Name: Courtney Zuniga Procedure Date: 09/13/2015 MRN: [id number] Attending MD: Milus Banister , MD Date of Birth: 1950/08/27 CSN: [id number] Age: 65 Admit Type: Outpatient Procedure:                Upper EUS Indications:              Gastric deformity on endoscopy/Subepithelial tumor                            versus extrinsic compression, Suspected mass in                            stomach on CT scan Providers:                Milus Banister, MD, Hilma Favors, RN, Ralene Bathe, Technician, Heide Scales, CRNA Referring MD:             [name], MD Medicines:                Monitored Anesthesia Care Complications:            No immediate complications. Estimated blood loss:                            None. Estimated Blood Loss:     Estimated blood loss: none. Procedure:                Pre-Anesthesia Assessment:                           - Prior to the procedure, a History and Physical                            was performed, and patient medications and                            allergies were reviewed. The patient's tolerance of                            previous anesthesia was also reviewed. The risks                            and benefits of the procedure and the sedation                            options and risks were discussed with the patient.                            All questions were answered, and informed consent                            was obtained. Prior Anticoagulants: The patient has  taken no previous anticoagulant or antiplatelet                            agents. ASA Grade Assessment: III - A patient with                            severe systemic disease. After reviewing the risks                            and benefits, the patient was deemed in                            satisfactory condition to undergo the procedure.                           After obtaining  informed consent, the endoscope was                            passed under direct vision. Throughout the                            procedure, the patient's blood pressure, pulse, and                            oxygen saturations were monitored continuously. The                            [id number] ([id number]) scope was introduced through                            the mouth, and advanced to the second part of                            duodenum. The upper EUS was accomplished without                            difficulty. The patient tolerated the procedure                            well. Scope In: Scope Out: Findings:      Endoscopic Finding :      A small hiatal hernia was present.      A small, submucosal, non-circumferential mass with no bleeding and no       stigmata of recent bleeding was found in the gastric fundus. Tunnel       biopsies were taken with a cold forceps for histology after I determined       that FNA was not technically possible.      UGI tract was otherwise normal.      Endosonographic Finding :      1. An oval intramural (subepithelial) lesion was found in the fundus of       the stomach. The lesion was hypoechoic. Sonographically, the lesion       appeared to originate from the muscularis propria (Layer 4). The lesion  measured 11 mm (in maximum thickness). The outer endosonographic borders       were well defined. I could not approximate the lesion with the linear       echoendoscope (due it's proximal location, hiatal hernia) and so elected       to perform tunnel biopsies for tissue acquisition.      2. No perigastric adenopathy.      3. Limted views of liver, spleen, portal and splenic vessels were all       normal. Impression:               - Small hiatal hernia.                           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 Sampled with                            tunnel  biopsy. Given its size, morphology it is                            very unlikely that this lesion is causing any of                            your GI symptoms. Moderate Sedation:      N/A- Per Anesthesia Care Recommendation:           - Await path results. Likely I will recommend                            repeat EUS to check for interval significant growth                            (in 12-18 months).                           - Patient has a contact number available for                            emergencies. The signs and symptoms of potential                            delayed complications were discussed with the                            patient. Return to normal activities tomorrow.                            Written discharge instructions were provided to the                            patient.                           - Resume previous  diet.                           - Continue present medications. Procedure Code(s):        --- Professional ---                           [phone], Esophagogastroduodenoscopy, flexible,                            transoral; with endoscopic ultrasound examination                            limited to the esophagus, stomach or duodenum, and                            adjacent structures Diagnosis Code(s):        --- Professional ---                           K44.9, Diaphragmatic hernia without obstruction or                            gangrene                           D49.0, Neoplasm of unspecified behavior of                            digestive system                           K31.89, Other diseases of stomach and duodenum                           R93.3, Abnormal findings on diagnostic imaging of                            other parts of digestive tract CPT copyright 2016 [employer]. All rights reserved. The codes documented in this report are preliminary and upon coder review may  be revised to meet current compliance  requirements. Milus Banister, MD 09/13/2015 10:05:41 AM This report has been signed electronically. Number of Addenda: 0

## 2015-09-13 NOTE — Discharge Instructions (Signed)

## 2015-09-13 NOTE — Transfer of Care (Signed)
Immediate Anesthesia Transfer of Care Note  Patient: Courtney Zuniga  Procedure(s) Performed: Procedure(s): UPPER ENDOSCOPIC ULTRASOUND (EUS) LINEAR (N/A)  Patient Location: PACU  Anesthesia Type:MAC  Level of Consciousness: Patient easily awoken, sedated, comfortable, cooperative, following commands, responds to stimulation.   Airway & Oxygen Therapy: Patient spontaneously breathing, ventilating well, oxygen via simple oxygen mask.  Post-op Assessment: Report given to PACU RN, vital signs reviewed and stable, moving all extremities.   Post vital signs: Reviewed and stable.  Complications: No apparent anesthesia complications

## 2015-09-14 ENCOUNTER — Encounter (HOSPITAL_COMMUNITY): Payer: Self-pay | Admitting: Gastroenterology

## 2015-09-18 ENCOUNTER — Other Ambulatory Visit (HOSPITAL_COMMUNITY): Payer: Self-pay | Admitting: Nurse Practitioner

## 2015-09-18 DIAGNOSIS — Z720 Tobacco use: Secondary | ICD-10-CM

## 2015-09-21 ENCOUNTER — Ambulatory Visit (HOSPITAL_COMMUNITY)
Admission: RE | Admit: 2015-09-21 | Discharge: 2015-09-21 | Disposition: A | Discharge status: Home / Self Care | Payer: Medicare HMO | Source: Ambulatory Visit | Attending: Family Medicine | Admitting: Family Medicine

## 2015-09-21 DIAGNOSIS — Z1231 Encounter for screening mammogram for malignant neoplasm of breast: Secondary | ICD-10-CM | POA: Diagnosis not present

## 2015-09-21 DIAGNOSIS — R928 Other abnormal and inconclusive findings on diagnostic imaging of breast: Secondary | ICD-10-CM | POA: Diagnosis not present

## 2015-09-25 ENCOUNTER — Other Ambulatory Visit: Payer: Self-pay | Admitting: Family Medicine

## 2015-09-25 ENCOUNTER — Ambulatory Visit (HOSPITAL_COMMUNITY)
Admission: RE | Admit: 2015-09-25 | Discharge: 2015-09-25 | Disposition: A | Discharge status: Home / Self Care | Payer: Medicare HMO | Source: Ambulatory Visit | Attending: Nurse Practitioner | Admitting: Nurse Practitioner

## 2015-09-25 DIAGNOSIS — I700 Atherosclerosis of aorta: Secondary | ICD-10-CM | POA: Insufficient documentation

## 2015-09-25 DIAGNOSIS — R928 Other abnormal and inconclusive findings on diagnostic imaging of breast: Secondary | ICD-10-CM

## 2015-09-25 DIAGNOSIS — R911 Solitary pulmonary nodule: Secondary | ICD-10-CM | POA: Insufficient documentation

## 2015-09-25 DIAGNOSIS — Z720 Tobacco use: Secondary | ICD-10-CM | POA: Insufficient documentation

## 2015-09-25 DIAGNOSIS — I2510 Atherosclerotic heart disease of native coronary artery without angina pectoris: Secondary | ICD-10-CM | POA: Diagnosis not present

## 2015-09-25 DIAGNOSIS — J439 Emphysema, unspecified: Secondary | ICD-10-CM | POA: Diagnosis not present

## 2015-09-25 DIAGNOSIS — Z87891 Personal history of nicotine dependence: Secondary | ICD-10-CM | POA: Insufficient documentation

## 2015-10-03 ENCOUNTER — Other Ambulatory Visit (HOSPITAL_COMMUNITY): Payer: Self-pay | Admitting: Respiratory Therapy

## 2015-10-03 DIAGNOSIS — J441 Chronic obstructive pulmonary disease with (acute) exacerbation: Secondary | ICD-10-CM

## 2015-10-09 ENCOUNTER — Ambulatory Visit: Payer: Medicare HMO | Admitting: Nurse Practitioner

## 2015-10-09 ENCOUNTER — Telehealth: Payer: Self-pay | Admitting: Nurse Practitioner

## 2015-10-09 ENCOUNTER — Other Ambulatory Visit (HOSPITAL_COMMUNITY): Payer: Self-pay | Admitting: Nurse Practitioner

## 2015-10-09 ENCOUNTER — Encounter: Payer: Self-pay | Admitting: Nurse Practitioner

## 2015-10-09 ENCOUNTER — Ambulatory Visit (HOSPITAL_COMMUNITY)
Admission: RE | Admit: 2015-10-09 | Discharge: 2015-10-09 | Disposition: A | Discharge status: Home / Self Care | Payer: Medicare HMO | Source: Ambulatory Visit | Attending: Family Medicine | Admitting: Family Medicine

## 2015-10-09 DIAGNOSIS — N63 Unspecified lump in breast: Secondary | ICD-10-CM | POA: Insufficient documentation

## 2015-10-09 DIAGNOSIS — R928 Other abnormal and inconclusive findings on diagnostic imaging of breast: Secondary | ICD-10-CM

## 2015-10-09 NOTE — Telephone Encounter (Signed)
Noted  

## 2015-10-09 NOTE — Telephone Encounter (Signed)
Letter mailed

## 2015-10-09 NOTE — Telephone Encounter (Signed)
Pt was a no show

## 2015-10-12 ENCOUNTER — Encounter: Payer: Self-pay | Admitting: *Deleted

## 2015-10-12 DIAGNOSIS — M1990 Unspecified osteoarthritis, unspecified site: Secondary | ICD-10-CM | POA: Insufficient documentation

## 2015-10-17 ENCOUNTER — Institutional Professional Consult (permissible substitution) (INDEPENDENT_AMBULATORY_CARE_PROVIDER_SITE_OTHER): Payer: Medicare HMO | Admitting: Cardiothoracic Surgery

## 2015-10-17 ENCOUNTER — Other Ambulatory Visit: Payer: Self-pay | Admitting: *Deleted

## 2015-10-17 VITALS — BP 115/72 | HR 82 | Resp 20 | Ht 67.0 in | Wt 140.0 lb

## 2015-10-17 DIAGNOSIS — Z9981 Dependence on supplemental oxygen: Secondary | ICD-10-CM | POA: Diagnosis not present

## 2015-10-17 DIAGNOSIS — R911 Solitary pulmonary nodule: Secondary | ICD-10-CM | POA: Diagnosis not present

## 2015-10-17 DIAGNOSIS — J431 Panlobular emphysema: Secondary | ICD-10-CM | POA: Diagnosis not present

## 2015-10-17 NOTE — H&P (Signed)
PCP is Barry Dienes, NP Referring Provider is Barry Dienes, NP  Chief Complaint  Patient presents with  . Lung Lesion    Surgical eval, Chest CT 09/25/15   Patient examined, chest CT scan personally reviewed and counseled with patient HPI: 65 year old AA female active smoker on home oxygen who require several inhalers for frequent COPD flare up episodes presents for evaluation of a recently diagnosed 2.2 cm right middle lobe nodule. This was seen on a screening CT scan of chest. She has smoked since age 56 and currently smokes one half to one pack per day. She has dyspnea with exertion. A bleeding the patient 100 feet in the office dropped her room air saturation from 95% down to 91%. She denies hemoptysis fever weight loss or bone pain. The patient had a brain CT scan performed last month for headache which was negative for tumor. 10 years ago the patient had a transnasal resection of a pituitary adenoma at [facility].  The patient has a strong family history of lung cancer-father, 2 brothers died of lung cancer.  Patient denies angina. She had an echocardiogram January 2017 showing normal LV function, no valvular disease.  The patient lives alone but has a low functional status. She is supported by her daughter who presents to the office to accompany the patient.   Earlier this spring the patient underwent endoscopy and biopsy of a gastric mass which was benign. Chronic inflammation.  Past Medical History  Diagnosis Date  . Brain tumor (benign) ([place]) 2005 [facility]    Benign  . Hypertension   . Asthma   . Chronic headaches   . Chronic hip pain   . Chronic pain   . NSTEMI (non-ST elevated myocardial infarction) ([place])   . Coronary artery disease   . On home O2     qhs  . COPD (chronic obstructive pulmonary disease) ([place])   . Arthritis     osteoartritis    Past Surgical History  Procedure Laterality Date  . Cholecystectomy    . Tumor removal  2005    Benign  . Colonoscopy   2015    Results requested from [facility]  . Esophagogastroduodenoscopy N/A 08/14/2015    Procedure: ESOPHAGOGASTRODUODENOSCOPY (EGD);  Surgeon: Daneil Dolin, MD;  [facility];  Service: Endoscopy;  Laterality: N/A;  215   . Esophagogastroduodenoscopy (egd) with propofol N/A 09/13/2015    Procedure: ESOPHAGOGASTRODUODENOSCOPY (EGD) WITH PROPOFOL;  Surgeon: Milus Banister, MD;  [facility]: [facility];  Service: Endoscopy;  Laterality: N/A;  . Upper esophageal endoscopic ultrasound (eus)  09/13/2015    Procedure: UPPER ESOPHAGEAL ENDOSCOPIC ULTRASOUND (EUS);  Surgeon: Milus Banister, MD;  Location: [facility];  Service: Endoscopy;;    Family History  Problem Relation Age of Onset  . Ovarian cancer Mother   . Lung cancer Father   . Brain cancer Sister     Half sister  . Lung cancer Brother   . Lung cancer Brother   . Prostate cancer Brother     Social History Social History  Substance Use Topics  . Smoking status: Current Every Day Smoker -- 0.50 packs/day    Types: Cigarettes  . Smokeless tobacco: Never Used  . Alcohol Use: No    Current Outpatient Prescriptions  Medication Sig Dispense Refill  . albuterol (PROVENTIL HFA;VENTOLIN HFA) 108 (90 BASE) MCG/ACT inhaler Inhale 2 puffs into the lungs every 6 (six) hours as needed for shortness of breath.     Marland Kitchen albuterol (  PROVENTIL) (2.5 MG/3ML) 0.083% nebulizer solution Take 2.5 mg by nebulization every 6 (six) hours as needed for wheezing or shortness of breath.    . calcium-vitamin D 250-100 MG-UNIT per tablet Take 1 tablet by mouth daily.    . Cholecalciferol (VITAMIN D3) 5000 units TABS Take 1 tablet by mouth daily.    . cyclobenzaprine (FLEXERIL) 5 MG tablet Take 5 mg by mouth 3 (three) times daily.    . Fluticasone-Salmeterol (ADVAIR) 500-50 MCG/DOSE AEPB Inhale 1 puff into the lungs every 12 (twelve) hours.    Marland Kitchen HYDROcodone-acetaminophen (NORCO/VICODIN) 5-325 MG tablet Take 1 tablet by mouth every 6 (six)  hours as needed. (Patient taking differently: Take 1 tablet by mouth every 6 (six) hours as needed (For pain.). ) 20 tablet 0  . lisinopril (PRINIVIL,ZESTRIL) 40 MG tablet Take 40 mg by mouth daily.    Marland Kitchen QVAR 80 MCG/ACT inhaler Inhale 1 puff into the lungs 2 (two) times daily.     . Tiotropium Bromide-Olodaterol (STIOLTO RESPIMAT) 2.5-2.5 MCG/ACT AERS Inhale 1 puff into the lungs daily.     No current facility-administered medications for this visit.    No Known Allergies  Review of Systems        Review of Systems :  [ y ] = yes, [  ] = no        General :  Weight gain [   ]    Weight loss  [  y ]  Fatigue [  ]  Fever [  ]  Chills  [  ]                                Weakness  [  ]           HEENT    Headache [  ]  Dizziness [  ]  Blurred vision [  ] Glaucoma  [  ]                          Nosebleeds [  ] Painful or loose teeth [  ]        Cardiac :  Chest pain/ pressure [  ]  Resting SOB [  ] exertional SOB Blue.Reese  ]                        Orthopnea [  ]  Pedal edema  [  ]  Palpitations [  ] Syncope/presyncope '[ ]'$                         Paroxysmal nocturnal dyspnea [  ]         Pulmonary : cough Blue.Reese ]  wheezing [ y ]  Hemoptysis [  ] Sputum [  ] Snoring [  ]                              Pneumothorax [  ]  Sleep apnea [  ]        GI : Vomiting [ y ]  Dysphagia [  ]  Melena  [  ]  Abdominal pain Blue.Reese  ] BRBPR [  ]              Heart burn [ y ]  Constipation [  ] Diarrhea  [  ]  Colonoscopy [   ] y       GU : Hematuria [  ]  Dysuria [  ]  Nocturia [  ] UTI's [  ]        Vascular : Claudication [  ]  Rest pain [  ]  DVT [  ] Vein stripping [  ] leg ulcers [  ]                          TIA [  ] Stroke [  ]  Varicose veins [  ]        NEURO :  Headaches  Blue.Reese  ] Seizures [  ] Vision changes [  ] Paresthesias [  ]                                       Seizures [  ]        Musculoskeletal :  Arthritis [y hip.back  ] Gout  [  ]  Back pain [  ]  Joint pain [  ]        Skin :  Rash [  ]  Melanoma  [  ] Sores [  ]        Heme : Bleeding problems [  ]Clotting Disorders [  ] Anemia [  ]Blood Transfusion '[ ]'$         Endocrine : Diabetes [  ] Heat or Cold intolerance [  ] Polyuria [  ]excessive thirst '[ ]'$         Psych : Depression [  ]  Anxiety [  ]  Psych hospitalizations [  ] Memory change [  ]      Right-hand dominant      No previous chest surgery or thoracic trauma, pneumothorax                                         BP 115/72 mmHg  Pulse 82  Resp 20  Ht '5\' 7"'$  (1.702 m)  Wt 140 lb (63.504 kg)  BMI 21.92 kg/m2  SpO2 95% Physical Exam       Physical Exam  General: Chronically ill middle-aged AA female, anxious HEENT: Normocephalic pupils equal , dentition adequate Neck: Supple without JVD, adenopathy, or bruit Chest: Coarse breath sounds bilaterally with scattered rhonchi, no tenderness             or deformity Cardiovascular: Regular rate and rhythm, no murmur, no gallop, peripheral pulses             palpable in wrists and both groins nonpalpable pedal pulses Abdomen:  Soft, nontender, no palpable mass or organomegaly Extremities: Warm, well-perfused, no clubbing cyanosis edema or tenderness,              no venous stasis changes of the legs Rectal/GU: Deferred Neuro: Grossly non--focal and symmetrical throughout Skin: Clean and dry without rash or ulceration    Diagnostic Tests: CT scan demonstrates a 2.2 cm right middle lobe nodule close to the fissure with the lower lobe. There is no suspicious mediastinal adenopathy. Recent head CT scan for headache showed no evidence of metastatic disease.  However the patient has poor underlying pulmonary function on home oxygen with active smoking. She takes  multiple inhalers and has frequent episodes of COPD flare up. Ovary function testing is pending.  PET scan will be obtained to further evaluate the malignant potential this suspicious right middle lobe nodule. It will also provide clinical staging  information  Impression: 2.2 cm right middle lobe nodule detected on screening CT scan for a patient with heavy smoking history, COPD and home oxygen requirement. We will obtain a PET scan and PFTs and then reevaluate.  If PFTs are inadequate for resection she will be referred for a transthoracic needle biopsy and SBRT Plan: Return after PET scan and PFTs to discuss the situation and recommend therapy-surgery vs. SBRT  Len Childs, MD [facility] [phone]

## 2015-10-23 ENCOUNTER — Ambulatory Visit (HOSPITAL_COMMUNITY)
Admission: RE | Admit: 2015-10-23 | Discharge: 2015-10-23 | Disposition: A | Discharge status: Home / Self Care | Payer: Medicare HMO | Source: Ambulatory Visit | Attending: Cardiothoracic Surgery | Admitting: Cardiothoracic Surgery

## 2015-10-23 DIAGNOSIS — I2510 Atherosclerotic heart disease of native coronary artery without angina pectoris: Secondary | ICD-10-CM | POA: Diagnosis not present

## 2015-10-23 DIAGNOSIS — I289 Disease of pulmonary vessels, unspecified: Secondary | ICD-10-CM | POA: Insufficient documentation

## 2015-10-23 DIAGNOSIS — R911 Solitary pulmonary nodule: Secondary | ICD-10-CM

## 2015-10-23 DIAGNOSIS — I700 Atherosclerosis of aorta: Secondary | ICD-10-CM | POA: Diagnosis not present

## 2015-10-23 DIAGNOSIS — R918 Other nonspecific abnormal finding of lung field: Secondary | ICD-10-CM | POA: Diagnosis not present

## 2015-10-23 DIAGNOSIS — J439 Emphysema, unspecified: Secondary | ICD-10-CM | POA: Diagnosis not present

## 2015-10-23 LAB — GLUCOSE, CAPILLARY: Glucose-Capillary: 94 mg/dL (ref 65–99)

## 2015-10-23 MED ORDER — FLUDEOXYGLUCOSE F - 18 (FDG) INJECTION
7.5000 | Freq: Once | INTRAVENOUS | Status: AC | PRN
  Administered 2015-10-23: 7.5 via INTRAVENOUS

## 2015-10-24 ENCOUNTER — Encounter: Payer: Medicare HMO | Admitting: Cardiothoracic Surgery

## 2015-10-26 ENCOUNTER — Ambulatory Visit (HOSPITAL_COMMUNITY)
Admission: RE | Admit: 2015-10-26 | Discharge: 2015-10-26 | Disposition: A | Discharge status: Home / Self Care | Payer: Medicare HMO | Source: Ambulatory Visit | Attending: Nurse Practitioner | Admitting: Nurse Practitioner

## 2015-10-26 DIAGNOSIS — J441 Chronic obstructive pulmonary disease with (acute) exacerbation: Secondary | ICD-10-CM

## 2015-10-26 LAB — SPIROMETRY WITH GRAPH
FEF 25-75 PRE: 0.49 L/s
FEF2575-%PRED-PRE: 23 %
FEV1-%Pred-Pre: 57 %
FEV1-PRE: 1.28 L
FEV1FVC-%Pred-Pre: 55 %
FEV6-%Pred-Pre: 103 %
FEV6-Pre: 2.81 L
FEV6FVC-%PRED-PRE: 98 %
FVC-%PRED-PRE: 104 %
FVC-Pre: 2.94 L
PRE FEV1/FVC RATIO: 44 %
Pre FEV6/FVC Ratio: 96 %

## 2015-10-31 ENCOUNTER — Encounter: Payer: Medicare HMO | Admitting: Cardiothoracic Surgery

## 2015-11-06 ENCOUNTER — Ambulatory Visit (INDEPENDENT_AMBULATORY_CARE_PROVIDER_SITE_OTHER): Payer: Medicare HMO | Admitting: Cardiothoracic Surgery

## 2015-11-06 ENCOUNTER — Other Ambulatory Visit: Payer: Self-pay | Admitting: *Deleted

## 2015-11-06 ENCOUNTER — Encounter: Payer: Self-pay | Admitting: Cardiothoracic Surgery

## 2015-11-06 VITALS — BP 123/83 | HR 78 | Resp 20 | Ht 67.0 in | Wt 140.0 lb

## 2015-11-06 DIAGNOSIS — R911 Solitary pulmonary nodule: Secondary | ICD-10-CM | POA: Diagnosis not present

## 2015-11-06 DIAGNOSIS — J431 Panlobular emphysema: Secondary | ICD-10-CM | POA: Diagnosis not present

## 2015-11-06 NOTE — Progress Notes (Signed)
PCP is Barry Dienes, NP Referring Provider is Barry Dienes, NP  Chief Complaint  Patient presents with  . Lung Lesion    further discuss surgery,  Spirometry with Graph 10/26/15, PET Scan 10/23/15    HPI:The patient returns for further discussion of a recently diagnosed 2.5 cm right middle lobe density suspicious for bronchogenic carcinoma. Since the original consultation-visit the patient has had a PET scan and PFTs. The patient continues to smoke 0.5-1 pack per day.  PET scan shows the right middle lobe mass to be very hypermetabolic with SUV 24. There are some right hilar nodes with mild activity SUV 3.5. There is a small 6 mm left upper lobe nodule with activity 2.3 SUV. No abdominal or skeletal evidence of metastatic disease.  PFTs spirometry is performed demonstrating FEV1 1.2. Diffusion capacity was not performed. Patient's saturation room air is 92-93 percent. She ison home oxygen  The patient's functional status is very poor and she has advanced COPD difficult to manage by her pulmonologist with several documented admissions for flareup. The patient is not considered a surgical candidate for lobectomy. I will plan on bronchoscopy with biopsy of the right middle lobe mass and nodes. At this point the patient appears to be best treated with SBRT  Past Medical History:  Diagnosis Date  . Arthritis    osteoartritis  . Asthma   . Brain tumor (benign) ([place]) 2005 [facility]   Benign  . Chronic headaches   . Chronic hip pain   . Chronic pain   . COPD (chronic obstructive pulmonary disease) ([place])   . Coronary artery disease   . Hypertension   . NSTEMI (non-ST elevated myocardial infarction) ([place])   . On home O2    qhs    Past Surgical History:  Procedure Laterality Date  . CHOLECYSTECTOMY    . COLONOSCOPY  2015   Results requested from [facility]  . ESOPHAGOGASTRODUODENOSCOPY N/A 08/14/2015   Procedure: ESOPHAGOGASTRODUODENOSCOPY (EGD);  Surgeon: Daneil Dolin, MD;   [facility];  Service: Endoscopy;  Laterality: N/A;  215   . ESOPHAGOGASTRODUODENOSCOPY (EGD) WITH PROPOFOL N/A 09/13/2015   Procedure: ESOPHAGOGASTRODUODENOSCOPY (EGD) WITH PROPOFOL;  Surgeon: Milus Banister, MD;  [facility];  Service: Endoscopy;  Laterality: N/A;  . TUMOR REMOVAL  2005   Benign  . UPPER ESOPHAGEAL ENDOSCOPIC ULTRASOUND (EUS)  09/13/2015   Procedure: UPPER ESOPHAGEAL ENDOSCOPIC ULTRASOUND (EUS);  Surgeon: Milus Banister, MD;  Location: [facility];  Service: Endoscopy;;    Family History  Problem Relation Age of Onset  . Ovarian cancer Mother   . Lung cancer Father   . Brain cancer Sister     Half sister  . Lung cancer Brother   . Lung cancer Brother   . Prostate cancer Brother     Social History Social History  Substance Use Topics  . Smoking status: Current Every Day Smoker    Packs/day: 0.50    Types: Cigarettes  . Smokeless tobacco: Never Used  . Alcohol use No    Current Outpatient Prescriptions  Medication Sig Dispense Refill  . albuterol (PROVENTIL HFA;VENTOLIN HFA) 108 (90 BASE) MCG/ACT inhaler Inhale 2 puffs into the lungs every 6 (six) hours as needed for shortness of breath.     Marland Kitchen albuterol (PROVENTIL) (2.5 MG/3ML) 0.083% nebulizer solution Take 2.5 mg by nebulization every 6 (six) hours as needed for wheezing or shortness of breath.    . calcium-vitamin D 250-100 MG-UNIT per tablet Take 1 tablet by  mouth daily.    . Cholecalciferol (VITAMIN D3) 5000 units TABS Take 1 tablet by mouth daily.    . cyclobenzaprine (FLEXERIL) 5 MG tablet Take 5 mg by mouth 3 (three) times daily.    . Fluticasone-Salmeterol (ADVAIR) 500-50 MCG/DOSE AEPB Inhale 1 puff into the lungs every 12 (twelve) hours.    Marland Kitchen HYDROcodone-acetaminophen (NORCO/VICODIN) 5-325 MG tablet Take 1 tablet by mouth every 6 (six) hours as needed. (Patient taking differently: Take 1 tablet by mouth every 6 (six) hours as needed (For pain.). ) 20 tablet 0  . lisinopril  (PRINIVIL,ZESTRIL) 40 MG tablet Take 40 mg by mouth daily.    Marland Kitchen QVAR 80 MCG/ACT inhaler Inhale 1 puff into the lungs 2 (two) times daily.     . Tiotropium Bromide-Olodaterol (STIOLTO RESPIMAT) 2.5-2.5 MCG/ACT AERS Inhale 1 puff into the lungs daily.     No current facility-administered medications for this visit.     No Known Allergies  Review of Systems  Shortness of breath with exertion No headache change in vision Productive cough but not discolored or with hemoptysis No palpitations No abdominal pain or change in bowel habits, no melena No back pain or arthritis No dysuria    BP 123/83 (BP Location: Right Arm, Patient Position: Sitting, Cuff Size: Small)   Pulse 78   Resp 20   Ht '5\' 7"'$  (1.702 m)   Wt 140 lb (63.5 kg)   SpO2 93% Comment: RA  BMI 21.93 kg/m  Physical Exam      Exam    General- alert and comfortable   Lungs- coarse breath sounds with bilateral wheeze   Cor- regular rate and rhythm, no murmur , gallop   Abdomen- soft, non-tender   Extremities - warm, non-tender, minimal edema   Neuro- oriented, appropriate, no focal weakness   Diagnostic Tests: PET scan images personally reviewed and counseled with patient and daughter Donnelly Angelica function test data personally reviewed and counseled with patient and daughter  Impression: 2.5 cm right middle lobe mass with hypermetabolic activity and PET scan and some associated right hilar adenopathy with abnormal activity  Advanced COPD with poor PFTs on home oxygen  Plan: Plan proctoscopy with endobronchial biopsy of right middle lobe and hilar nodes in operating room at [place] on July 27. General anesthesia. Procedure indications risks and alternatives discussed with patient. She understands that at this point she would not be considered a surgical candidate for resection. She understands of the procedure this week is just for a biopsy and will not remove the tumor.  Len Childs, MD [facility] [phone]

## 2015-11-07 ENCOUNTER — Encounter (HOSPITAL_COMMUNITY): Payer: Self-pay | Admitting: *Deleted

## 2015-11-07 NOTE — Progress Notes (Signed)
Pt states she was told at some time that she had had a couple of heart attacks, but she was not aware of this. She denies any recent chest pain, does have NTG to use if needed, but states she hasn't used it in over a year.

## 2015-11-08 ENCOUNTER — Ambulatory Visit (HOSPITAL_COMMUNITY): Payer: Medicare HMO

## 2015-11-08 ENCOUNTER — Encounter (HOSPITAL_COMMUNITY): Payer: Self-pay | Admitting: *Deleted

## 2015-11-08 ENCOUNTER — Encounter (HOSPITAL_COMMUNITY)
Admission: RE | Disposition: A | Discharge status: Home / Self Care | Payer: Self-pay | Source: Ambulatory Visit | Attending: Cardiothoracic Surgery

## 2015-11-08 ENCOUNTER — Ambulatory Visit (HOSPITAL_COMMUNITY)
Admission: RE | Admit: 2015-11-08 | Discharge: 2015-11-08 | Disposition: A | Discharge status: Home / Self Care | Payer: Medicare HMO | Source: Ambulatory Visit | Attending: Cardiothoracic Surgery | Admitting: Cardiothoracic Surgery

## 2015-11-08 ENCOUNTER — Ambulatory Visit (HOSPITAL_COMMUNITY): Payer: Medicare HMO | Admitting: Certified Registered Nurse Anesthetist

## 2015-11-08 DIAGNOSIS — R0602 Shortness of breath: Secondary | ICD-10-CM

## 2015-11-08 DIAGNOSIS — I10 Essential (primary) hypertension: Secondary | ICD-10-CM | POA: Diagnosis not present

## 2015-11-08 DIAGNOSIS — R590 Localized enlarged lymph nodes: Secondary | ICD-10-CM | POA: Insufficient documentation

## 2015-11-08 DIAGNOSIS — Z79899 Other long term (current) drug therapy: Secondary | ICD-10-CM | POA: Insufficient documentation

## 2015-11-08 DIAGNOSIS — R918 Other nonspecific abnormal finding of lung field: Secondary | ICD-10-CM | POA: Diagnosis not present

## 2015-11-08 DIAGNOSIS — F17210 Nicotine dependence, cigarettes, uncomplicated: Secondary | ICD-10-CM | POA: Insufficient documentation

## 2015-11-08 DIAGNOSIS — J449 Chronic obstructive pulmonary disease, unspecified: Secondary | ICD-10-CM | POA: Insufficient documentation

## 2015-11-08 DIAGNOSIS — I2510 Atherosclerotic heart disease of native coronary artery without angina pectoris: Secondary | ICD-10-CM | POA: Diagnosis not present

## 2015-11-08 DIAGNOSIS — Z7951 Long term (current) use of inhaled steroids: Secondary | ICD-10-CM | POA: Diagnosis not present

## 2015-11-08 DIAGNOSIS — R222 Localized swelling, mass and lump, trunk: Secondary | ICD-10-CM | POA: Diagnosis not present

## 2015-11-08 DIAGNOSIS — I252 Old myocardial infarction: Secondary | ICD-10-CM | POA: Insufficient documentation

## 2015-11-08 DIAGNOSIS — Z9981 Dependence on supplemental oxygen: Secondary | ICD-10-CM | POA: Insufficient documentation

## 2015-11-08 DIAGNOSIS — R911 Solitary pulmonary nodule: Secondary | ICD-10-CM

## 2015-11-08 DIAGNOSIS — Z801 Family history of malignant neoplasm of trachea, bronchus and lung: Secondary | ICD-10-CM | POA: Diagnosis not present

## 2015-11-08 HISTORY — DX: Major depressive disorder, single episode, unspecified: F32.9

## 2015-11-08 HISTORY — DX: Pneumonia, unspecified organism: J18.9

## 2015-11-08 HISTORY — DX: Anemia, unspecified: D64.9

## 2015-11-08 HISTORY — PX: VIDEO BRONCHOSCOPY WITH ENDOBRONCHIAL ULTRASOUND: SHX6177

## 2015-11-08 HISTORY — DX: Reserved for inherently not codable concepts without codable children: IMO0001

## 2015-11-08 LAB — CBC
HCT: 41.8 % (ref 36.0–46.0)
Hemoglobin: 14.3 g/dL (ref 12.0–15.0)
MCH: 29.2 pg (ref 26.0–34.0)
MCHC: 34.2 g/dL (ref 30.0–36.0)
MCV: 85.5 fL (ref 78.0–100.0)
Platelets: 194 10*3/uL (ref 150–400)
RBC: 4.89 MIL/uL (ref 3.87–5.11)
RDW: 14.3 % (ref 11.5–15.5)
WBC: 6 10*3/uL (ref 4.0–10.5)

## 2015-11-08 LAB — COMPREHENSIVE METABOLIC PANEL
ALT: 11 U/L — ABNORMAL LOW (ref 14–54)
AST: 22 U/L (ref 15–41)
Albumin: 4 g/dL (ref 3.5–5.0)
Alkaline Phosphatase: 182 U/L — ABNORMAL HIGH (ref 38–126)
Anion gap: 7 (ref 5–15)
BUN: 11 mg/dL (ref 6–20)
CO2: 25 mmol/L (ref 22–32)
Calcium: 9.3 mg/dL (ref 8.9–10.3)
Chloride: 110 mmol/L (ref 101–111)
Creatinine, Ser: 0.59 mg/dL (ref 0.44–1.00)
GFR calc Af Amer: >60 mL/min (ref >60)
GFR calc non Af Amer: >60 mL/min (ref >60)
Glucose, Bld: 66 mg/dL (ref 65–99)
Potassium: 3.8 mmol/L (ref 3.5–5.1)
Sodium: 142 mmol/L (ref 135–145)
Total Bilirubin: 0.4 mg/dL (ref 0.3–1.2)
Total Protein: 6.7 g/dL (ref 6.5–8.1)

## 2015-11-08 LAB — PROTIME-INR
INR: 1.04
Prothrombin Time: 13.6 seconds (ref 11.4–15.2)

## 2015-11-08 LAB — APTT: aPTT: 37 seconds — ABNORMAL HIGH (ref 24–36)

## 2015-11-08 SURGERY — BRONCHOSCOPY, WITH EBUS
Anesthesia: General

## 2015-11-08 MED ORDER — OXYCODONE HCL 5 MG PO TABS
5.0000 mg | ORAL_TABLET | ORAL | Status: DC | PRN
  Administered 2015-11-08: 10 mg via ORAL

## 2015-11-08 MED ORDER — CEFAZOLIN SODIUM 1 G IJ SOLR
INTRAMUSCULAR | Status: DC | PRN
  Administered 2015-11-08: 2 g via INTRAMUSCULAR

## 2015-11-08 MED ORDER — FENTANYL CITRATE (PF) 250 MCG/5ML IJ SOLN
INTRAMUSCULAR | Status: AC
  Filled 2015-11-08: qty 5

## 2015-11-08 MED ORDER — MIDAZOLAM HCL 5 MG/5ML IJ SOLN
INTRAMUSCULAR | Status: DC | PRN
  Administered 2015-11-08: 1 mg via INTRAVENOUS

## 2015-11-08 MED ORDER — PROPOFOL 10 MG/ML IV BOLUS
INTRAVENOUS | Status: DC | PRN
  Administered 2015-11-08: 150 mg via INTRAVENOUS

## 2015-11-08 MED ORDER — SODIUM CHLORIDE 0.9% FLUSH: 3.0000 mL | Freq: Two times a day (BID) | INTRAVENOUS | Status: DC

## 2015-11-08 MED ORDER — IPRATROPIUM-ALBUTEROL 0.5-2.5 (3) MG/3ML IN SOLN: 3.0000 mL | RESPIRATORY_TRACT | Status: DC

## 2015-11-08 MED ORDER — ALBUTEROL SULFATE HFA 108 (90 BASE) MCG/ACT IN AERS: 2.0000 | INHALATION_SPRAY | Freq: Four times a day (QID) | RESPIRATORY_TRACT | Status: DC | PRN

## 2015-11-08 MED ORDER — ACETAMINOPHEN 500 MG PO TABS: 1000.0000 mg | ORAL_TABLET | Freq: Four times a day (QID) | ORAL | Status: DC

## 2015-11-08 MED ORDER — SUGAMMADEX SODIUM 200 MG/2ML IV SOLN
INTRAVENOUS | Status: DC | PRN
  Administered 2015-11-08: 150 mg via INTRAVENOUS

## 2015-11-08 MED ORDER — ACETAMINOPHEN 650 MG RE SUPP: 650.0000 mg | RECTAL | Status: DC | PRN

## 2015-11-08 MED ORDER — EPINEPHRINE HCL 1 MG/ML IJ SOLN
INTRAMUSCULAR | Status: AC
  Filled 2015-11-08: qty 1

## 2015-11-08 MED ORDER — PROPOFOL 500 MG/50ML IV EMUL
INTRAVENOUS | Status: DC | PRN
  Administered 2015-11-08: 50 ug/kg/min via INTRAVENOUS

## 2015-11-08 MED ORDER — PROMETHAZINE HCL 25 MG/ML IJ SOLN: 6.2500 mg | INTRAMUSCULAR | Status: DC | PRN

## 2015-11-08 MED ORDER — 0.9 % SODIUM CHLORIDE (POUR BTL) OPTIME
TOPICAL | Status: DC | PRN
  Administered 2015-11-08: 1000 mL

## 2015-11-08 MED ORDER — ALBUMIN HUMAN 5 % IV SOLN
INTRAVENOUS | Status: DC | PRN
  Administered 2015-11-08: 15:00:00 via INTRAVENOUS

## 2015-11-08 MED ORDER — MIDAZOLAM HCL 10 MG/2ML IJ SOLN
INTRAMUSCULAR | Status: AC
  Filled 2015-11-08: qty 2

## 2015-11-08 MED ORDER — LIDOCAINE HCL (CARDIAC) 20 MG/ML IV SOLN
INTRAVENOUS | Status: DC | PRN
  Administered 2015-11-08: 80 mg via INTRAVENOUS

## 2015-11-08 MED ORDER — OXYCODONE HCL 5 MG PO TABS
ORAL_TABLET | ORAL | Status: AC
  Filled 2015-11-08: qty 2

## 2015-11-08 MED ORDER — PROPOFOL 10 MG/ML IV BOLUS
INTRAVENOUS | Status: AC
  Filled 2015-11-08: qty 20

## 2015-11-08 MED ORDER — SODIUM CHLORIDE 0.9% FLUSH: 3.0000 mL | INTRAVENOUS | Status: DC | PRN

## 2015-11-08 MED ORDER — FENTANYL CITRATE (PF) 100 MCG/2ML IJ SOLN
INTRAMUSCULAR | Status: AC
  Filled 2015-11-08: qty 2

## 2015-11-08 MED ORDER — ROCURONIUM BROMIDE 50 MG/5ML IV SOLN
INTRAVENOUS | Status: AC
  Filled 2015-11-08: qty 1

## 2015-11-08 MED ORDER — ROCURONIUM BROMIDE 100 MG/10ML IV SOLN
INTRAVENOUS | Status: DC | PRN
  Administered 2015-11-08: 50 mg via INTRAVENOUS

## 2015-11-08 MED ORDER — FENTANYL CITRATE (PF) 100 MCG/2ML IJ SOLN
INTRAMUSCULAR | Status: DC | PRN
  Administered 2015-11-08: 50 ug via INTRAVENOUS
  Administered 2015-11-08: 100 ug via INTRAVENOUS

## 2015-11-08 MED ORDER — ACETAMINOPHEN 325 MG PO TABS: 650.0000 mg | ORAL_TABLET | ORAL | Status: DC | PRN

## 2015-11-08 MED ORDER — ALBUTEROL SULFATE (2.5 MG/3ML) 0.083% IN NEBU: 2.5000 mg | INHALATION_SOLUTION | Freq: Four times a day (QID) | RESPIRATORY_TRACT | Status: DC | PRN

## 2015-11-08 MED ORDER — SODIUM CHLORIDE 0.9 % IV SOLN: 250.0000 mL | INTRAVENOUS | Status: DC | PRN

## 2015-11-08 MED ORDER — FENTANYL CITRATE (PF) 100 MCG/2ML IJ SOLN
25.0000 ug | INTRAMUSCULAR | Status: DC | PRN
  Administered 2015-11-08: 25 ug via INTRAVENOUS

## 2015-11-08 MED ORDER — MIDAZOLAM HCL 2 MG/2ML IJ SOLN
INTRAMUSCULAR | Status: AC
  Filled 2015-11-08: qty 2

## 2015-11-08 MED ORDER — ALBUTEROL SULFATE (2.5 MG/3ML) 0.083% IN NEBU
INHALATION_SOLUTION | RESPIRATORY_TRACT | Status: AC
  Filled 2015-11-08: qty 3

## 2015-11-08 MED ORDER — LIDOCAINE 2% (20 MG/ML) 5 ML SYRINGE
INTRAMUSCULAR | Status: AC
  Filled 2015-11-08: qty 10

## 2015-11-08 MED ORDER — ONDANSETRON HCL 4 MG/2ML IJ SOLN
INTRAMUSCULAR | Status: DC | PRN
  Administered 2015-11-08: 4 mg via INTRAVENOUS

## 2015-11-08 MED ORDER — FENTANYL CITRATE (PF) 100 MCG/2ML IJ SOLN: 25.0000 ug | INTRAMUSCULAR | Status: DC | PRN

## 2015-11-08 MED ORDER — ALBUTEROL SULFATE (2.5 MG/3ML) 0.083% IN NEBU
2.5000 mg | INHALATION_SOLUTION | Freq: Once | RESPIRATORY_TRACT | Status: AC
  Administered 2015-11-08: 2.5 mg via RESPIRATORY_TRACT

## 2015-11-08 MED ORDER — LACTATED RINGERS IV SOLN
INTRAVENOUS | Status: DC
  Administered 2015-11-08 (×3): via INTRAVENOUS

## 2015-11-08 SURGICAL SUPPLY — 33 items
BRUSH CYTOL CELLEBRITY 1.5X140 (MISCELLANEOUS) ×3 IMPLANT
CANISTER SUCTION 2500CC (MISCELLANEOUS) ×3 IMPLANT
CONT SPEC 4OZ CLIKSEAL STRL BL (MISCELLANEOUS) ×3 IMPLANT
COTTONBALL LRG STERILE PKG (GAUZE/BANDAGES/DRESSINGS) IMPLANT
COVER DOME SNAP 22 D (MISCELLANEOUS) ×3 IMPLANT
COVER TABLE BACK 60X90 (DRAPES) ×3 IMPLANT
FORCEPS BIOP RJ4 1.8 (CUTTING FORCEPS) IMPLANT
GAUZE SPONGE 4X4 12PLY STRL (GAUZE/BANDAGES/DRESSINGS) ×3 IMPLANT
GLOVE BIO SURGEON STRL SZ 6.5 (GLOVE) ×2 IMPLANT
GLOVE BIO SURGEON STRL SZ7.5 (GLOVE) ×3 IMPLANT
GLOVE BIO SURGEONS STRL SZ 6.5 (GLOVE) ×1
GOWN STRL REUS W/ TWL LRG LVL3 (GOWN DISPOSABLE) ×2 IMPLANT
GOWN STRL REUS W/TWL LRG LVL3 (GOWN DISPOSABLE) ×4
KIT CLEAN ENDO COMPLIANCE (KITS) ×6 IMPLANT
KIT ROOM TURNOVER OR (KITS) ×3 IMPLANT
MARKER SKIN DUAL TIP RULER LAB (MISCELLANEOUS) ×3 IMPLANT
NEEDLE 22X1 1/2 (OR ONLY) (NEEDLE) IMPLANT
NEEDLE BIOPSY TRANSBRONCH 21G (NEEDLE) IMPLANT
NEEDLE BLUNT 18X1 FOR OR ONLY (NEEDLE) IMPLANT
NEEDLE EBUS SONO TIP PENTAX (NEEDLE) ×3 IMPLANT
NS IRRIG 1000ML POUR BTL (IV SOLUTION) ×3 IMPLANT
OIL SILICONE PENTAX (PARTS (SERVICE/REPAIRS)) ×3 IMPLANT
PAD ARMBOARD 7.5X6 YLW CONV (MISCELLANEOUS) ×6 IMPLANT
SOLUTION ANTI FOG 6CC (MISCELLANEOUS) ×3 IMPLANT
SYR 20CC LL (SYRINGE) ×3 IMPLANT
SYR 20ML ECCENTRIC (SYRINGE) ×3 IMPLANT
SYR 5ML LUER SLIP (SYRINGE) ×3 IMPLANT
SYR CONTROL 10ML LL (SYRINGE) IMPLANT
TOWEL OR 17X26 10 PK STRL BLUE (TOWEL DISPOSABLE) ×3 IMPLANT
TRAP SPECIMEN MUCOUS 40CC (MISCELLANEOUS) ×3 IMPLANT
TUBE CONNECTING 20'X1/4 (TUBING) ×2
TUBE CONNECTING 20X1/4 (TUBING) ×4 IMPLANT
WATER STERILE IRR 1000ML POUR (IV SOLUTION) ×3 IMPLANT

## 2015-11-08 NOTE — Anesthesia Procedure Notes (Signed)
Procedure Name: Intubation Date/Time: 11/08/2015 2:55 PM Performed by: Rebekah Chesterfield L Pre-anesthesia Checklist: Patient identified, Emergency Drugs available, Suction available and Patient being monitored Patient Re-evaluated:Patient Re-evaluated prior to inductionOxygen Delivery Method: Circle System Utilized Preoxygenation: Pre-oxygenation with 100% oxygen Intubation Type: IV induction Ventilation: Mask ventilation without difficulty and Oral airway inserted - appropriate to patient size Laryngoscope Size: Mac and 3 Tube type: Oral Tube size: 8.5 mm Number of attempts: 1 Airway Equipment and Method: Stylet and Oral airway Placement Confirmation: ETT inserted through vocal cords under direct vision,  positive ETCO2 and breath sounds checked- equal and bilateral Secured at: 23 cm Tube secured with: Tape Dental Injury: Teeth and Oropharynx as per pre-operative assessment

## 2015-11-08 NOTE — Anesthesia Preprocedure Evaluation (Addendum)
Anesthesia Evaluation  Patient identified by MRN, date of birth, ID band Patient awake    Reviewed: Allergy & Precautions, NPO status , Patient's Chart, lab work & pertinent test results  Airway Mallampati: II  TM Distance: >3 FB     Dental   Pulmonary shortness of breath, asthma , pneumonia, COPD, Current Smoker,     + decreased breath sounds      Cardiovascular hypertension, + Past MI   Rhythm:Regular Rate:Normal     Neuro/Psych    GI/Hepatic negative GI ROS, Neg liver ROS,   Endo/Other  negative endocrine ROS  Renal/GU negative Renal ROS     Musculoskeletal  (+) Arthritis ,   Abdominal   Peds  Hematology  (+) anemia ,   Anesthesia Other Findings   Reproductive/Obstetrics                           Anesthesia Physical Anesthesia Plan  ASA: III  Anesthesia Plan: General   Post-op Pain Management:    Induction: Intravenous  Airway Management Planned: Oral ETT  Additional Equipment:   Intra-op Plan:   Post-operative Plan: Extubation in OR and Possible Post-op intubation/ventilation  Informed Consent: I have reviewed the patients History and Physical, chart, labs and discussed the procedure including the risks, benefits and alternatives for the proposed anesthesia with the patient or authorized representative who has indicated his/her understanding and acceptance.   Dental advisory given  Plan Discussed with: Anesthesiologist and CRNA  Anesthesia Plan Comments:         Anesthesia Quick Evaluation

## 2015-11-08 NOTE — Op Note (Signed)
NAMERYHANNA, DUNSMORE NO.:  [id number]  MEDICAL RECORD NO.:  [id number]  LOCATION:  [facility]                         FACILITY:  [facility]  PHYSICIAN:  Ivin Poot, M.D.  DATE OF BIRTH:  10-22-1950  DATE OF PROCEDURE:  11/08/2015 DATE OF DISCHARGE:  11/08/2015                              OPERATIVE REPORT   OPERATION: 1. Video bronchoscopy. 2. Endoscopic bronchial ultrasound guided biopsy of mediastinal lymph     nodes.  SURGEON:  Ivin Poot, M.D.  ANESTHESIA:  General.  PREOPERATIVE DIAGNOSIS:  Right middle lobe mass, hilar adenopathy, both mass and nodes hypermetabolic on PET scan.  POSTOPERATIVE DIAGNOSIS:  Right middle lobe mass, hilar adenopathy, both mass and nodes hypermetabolic on PET scan.  PROCEDURE:  After the patient had a consulted and examined in the office and the indications and expected benefits of bronchoscopy and EBUS lymph node biopsy were discussed with the patient and her daughter including the risks of bleeding and pneumothorax.  The patient was brought to the operating room after informed consent had been signed.  General anesthesia was induced.  A proper time-out was performed.  Through the endotracheal tube, a video bronchoscope was passed.  The distal trachea and carina were normal.  The left mainstem bronchus and the segments of the left upper lobe and left lower lobe were visualized and had no endobronchial lesions.  The bronchoscope was passed on the right mainstem bronchus.  There were no endobronchial lesions in the right mainstem bronchus, the right upper lobe segments, the right middle lobe segments, or the right lower lobe segments.  Brushings were taken from the superior segment of the right middle lobe corresponding to the bronchial segments on CT scan.  These were sent for cytology.  Washings were also sent for cytology.  The bronchoscope was removed.  Next, the EBUS scope was inserted.  Several transbronchial  biopsies of the 4R lymph node station were taken and submitted for Pathology.  There was some minimal bleeding which cleared with irrigation.  The bronchoscope was withdrawn.  The patient was reversed from anesthesia, extubated, returned to recovery room.     Ivin Poot, M.D.     PV/MEDQ  D:  11/08/2015  T:  11/08/2015  Job:  [id number]

## 2015-11-08 NOTE — OR Nursing (Signed)
Pt and pt daughter denied any questions regarding discharge teaching. Pt was told that she may experience nausea and vomiting within the next 24 hours d/t anesthesia and that she may cough up a small amount of blood. They were told to call MD office if she coughs up more than 2 tablespoons of blood. Pt was told that she should continue to wear her oxygen and home and to increase activity slowly. Pt and family member understood instructions and stated that pt friend would be staying the night with her tonight.

## 2015-11-08 NOTE — Anesthesia Postprocedure Evaluation (Signed)
Anesthesia Post Note  Patient: Courtney Zuniga  Procedure(s) Performed: Procedure(s) (LRB): VIDEO BRONCHOSCOPY WITH ENDOBRONCHIAL ULTRASOUND (N/A)  Patient location during evaluation: PACU Anesthesia Type: General Level of consciousness: awake Pain management: pain level controlled Respiratory status: spontaneous breathing Cardiovascular status: stable Anesthetic complications: no    Last Vitals:  Vitals:   11/08/15 1720 11/08/15 1740  BP: 128/79 136/78  Pulse: 71 77  Resp: 18 18  Temp:      Last Pain:  Vitals:   11/08/15 1610  TempSrc:   PainSc: 0-No pain                 EDWARDS,Tierria Watson

## 2015-11-08 NOTE — Progress Notes (Signed)
The patient was examined and preop studies reviewed. There has been no change from the prior exam and the patient is ready for surgery.  plan bronchoscopy and EBUS on D [place]

## 2015-11-08 NOTE — Transfer of Care (Signed)
Immediate Anesthesia Transfer of Care Note  Patient: Courtney Zuniga  Procedure(s) Performed: Procedure(s): VIDEO BRONCHOSCOPY WITH ENDOBRONCHIAL ULTRASOUND (N/A)  Patient Location: PACU  Anesthesia Type:General  Level of Consciousness: awake and patient cooperative  Airway & Oxygen Therapy: Patient Spontanous Breathing and Patient connected to face mask oxygen  Post-op Assessment: Report given to RN, Post -op Vital signs reviewed and stable and Patient moving all extremities X 4  Post vital signs: Reviewed and stable  Last Vitals:  Vitals:   11/08/15 1027 11/08/15 1610  BP: (!) 195/93 (!) 147/82  Pulse: 64 92  Resp: 16 16  Temp: 36.8 C 36.6 C    Last Pain:  Vitals:   11/08/15 1610  TempSrc:   PainSc: 0-No pain      Patients Stated Pain Goal: 3 ([id number])  Complications: No apparent anesthesia complications

## 2015-11-08 NOTE — Brief Op Note (Signed)
11/08/2015  4:00 PM  PATIENT:  Courtney Zuniga  65 y.o. female  PRE-OPERATIVE DIAGNOSIS:  right middle lobe mass,right hilar adenopathy  POST-OPERATIVE DIAGNOSIS:  right middle lobe mass,right hilar adenopathy  PROCEDURE:  Procedure(s): VIDEO BRONCHOSCOPY WITH ENDOBRONCHIAL ULTRASOUND (N/A)  Brushings  and washings of RML EBUS of 4 R node   SURGEON:  Surgeon(s) and Role:    * Ivin Poot, MD - Primary  PHYSICIAN ASSISTANT:   ASSISTANTS: none   ANESTHESIA:   general  EBL:  Total I/O In: 1250 [I.V.:1000; IV Piggyback:250] Out: -   BLOOD ADMINISTERED:none  DRAINS: none   LOCAL MEDICATIONS USED:  NONE  SPECIMEN:  Aspirate, Lavage/Washing and Scraping  DISPOSITION OF SPECIMEN:  PATHOLOGY  COUNTS:  YES  TOURNIQUET:  * No tourniquets in log *  DICTATION: .Dragon Dictation  PLAN OF CARE: Discharge to home after PACU  PATIENT DISPOSITION:  PACU - hemodynamically stable.   Delay start of Pharmacological VTE agent (>24hrs) due to surgical blood loss or risk of bleeding: yes

## 2015-11-09 ENCOUNTER — Encounter (HOSPITAL_COMMUNITY): Payer: Self-pay | Admitting: Cardiothoracic Surgery

## 2015-11-15 ENCOUNTER — Encounter: Payer: Self-pay | Admitting: Cardiothoracic Surgery

## 2015-11-15 ENCOUNTER — Ambulatory Visit (INDEPENDENT_AMBULATORY_CARE_PROVIDER_SITE_OTHER): Payer: Medicare HMO | Admitting: Cardiothoracic Surgery

## 2015-11-15 ENCOUNTER — Other Ambulatory Visit: Payer: Self-pay | Admitting: *Deleted

## 2015-11-15 VITALS — BP 119/76 | HR 100 | Resp 20 | Ht 67.0 in | Wt 140.0 lb

## 2015-11-15 DIAGNOSIS — Z9889 Other specified postprocedural states: Secondary | ICD-10-CM

## 2015-11-15 DIAGNOSIS — R911 Solitary pulmonary nodule: Secondary | ICD-10-CM

## 2015-11-15 DIAGNOSIS — J431 Panlobular emphysema: Secondary | ICD-10-CM | POA: Diagnosis not present

## 2015-11-15 NOTE — Progress Notes (Signed)
PCP is Barry Dienes, NP Referring Provider is Barry Dienes, NP  Chief Complaint  Patient presents with  . Lung Lesion    f/u after bronch, EBUS to discuss biospy results    HGD:JMEQAST returns for discussion of transbronchial biopsies performed 6 days ago. She has severe COPD with recently diagnosed right middle lobe and right hilar nodes with mild hypermetabolic activity on PET scan. The patient is not a candidate for surgical resection due to her COPD and active smoking. Brushings washings and transbronchial ultrasound guided node biopsies were negative for malignancy. We will schedule the patient for CT-guided transthoracic needle biopsy by interventional radiology to confirm that she does not have malignancy.   Past Medical History:  Diagnosis Date  . Anemia    as a young woman  . Arthritis    osteoartritis  . Asthma   . Brain tumor (benign) ([place]) 2005 [facility]   Benign  . Chronic headaches   . Chronic hip pain   . Chronic pain   . COPD (chronic obstructive pulmonary disease) ([place])   . Coronary artery disease   . Depression   . Hypertension   . NSTEMI (non-ST elevated myocardial infarction) ([place])   . On home O2    qhs  . Pneumonia   . Shortness of breath dyspnea     Past Surgical History:  Procedure Laterality Date  . CHOLECYSTECTOMY    . COLONOSCOPY  2015   Results requested from [facility]  . COLONOSCOPY    . ESOPHAGOGASTRODUODENOSCOPY N/A 08/14/2015   Procedure: ESOPHAGOGASTRODUODENOSCOPY (EGD);  Surgeon: Daneil Dolin, MD;  [facility];  Service: Endoscopy;  Laterality: N/A;  215   . ESOPHAGOGASTRODUODENOSCOPY (EGD) WITH PROPOFOL N/A 09/13/2015   Procedure: ESOPHAGOGASTRODUODENOSCOPY (EGD) WITH PROPOFOL;  Surgeon: Milus Banister, MD;  [facility]: [facility];  Service: Endoscopy;  Laterality: N/A;  . TUMOR REMOVAL  2005   Benign  . UPPER ESOPHAGEAL ENDOSCOPIC ULTRASOUND (EUS)  09/13/2015   Procedure: UPPER ESOPHAGEAL ENDOSCOPIC ULTRASOUND  (EUS);  Surgeon: Milus Banister, MD;  Location: [facility];  Service: Endoscopy;;  . VIDEO BRONCHOSCOPY WITH ENDOBRONCHIAL ULTRASOUND N/A 11/08/2015   Procedure: VIDEO BRONCHOSCOPY WITH ENDOBRONCHIAL ULTRASOUND;  Surgeon: Ivin Poot, MD;  Location: [facility] OR;  Service: Thoracic;  Laterality: N/A;    Family History  Problem Relation Age of Onset  . Ovarian cancer Mother   . Lung cancer Father   . Lung cancer Brother   . Lung cancer Brother   . Prostate cancer Brother   . Brain cancer Sister     Half sister    Social History Social History  Substance Use Topics  . Smoking status: Current Every Day Smoker    Packs/day: 0.25    Types: Cigarettes  . Smokeless tobacco: Never Used     Comment: Pt has asked doctor for med to help   . Alcohol use No    Current Outpatient Prescriptions  Medication Sig Dispense Refill  . albuterol (PROVENTIL HFA;VENTOLIN HFA) 108 (90 BASE) MCG/ACT inhaler Inhale 2 puffs into the lungs every 6 (six) hours as needed for shortness of breath.     Marland Kitchen albuterol (PROVENTIL) (2.5 MG/3ML) 0.083% nebulizer solution Take 2.5 mg by nebulization every 6 (six) hours as needed for wheezing or shortness of breath.    . calcium-vitamin D 250-100 MG-UNIT per tablet Take 1 tablet by mouth daily.    . Cholecalciferol (VITAMIN D3) 5000 units TABS Take 1 tablet by mouth daily.    Marland Kitchen  cyclobenzaprine (FLEXERIL) 5 MG tablet Take 5 mg by mouth 3 (three) times daily.    . Fluticasone-Salmeterol (ADVAIR) 500-50 MCG/DOSE AEPB Inhale 1 puff into the lungs every 12 (twelve) hours.    Marland Kitchen HYDROcodone-acetaminophen (NORCO/VICODIN) 5-325 MG tablet Take 1 tablet by mouth every 6 (six) hours as needed. (Patient taking differently: Take 1 tablet by mouth every 6 (six) hours as needed (For pain.). ) 20 tablet 0  . lisinopril (PRINIVIL,ZESTRIL) 40 MG tablet Take 40 mg by mouth daily.    . nitroGLYCERIN (NITROSTAT) 0.4 MG SL tablet Place 0.4 mg under the tongue every 5 (five) minutes as needed for  chest pain.    Marland Kitchen QVAR 80 MCG/ACT inhaler Inhale 1 puff into the lungs 2 (two) times daily as needed.     . Tiotropium Bromide-Olodaterol (STIOLTO RESPIMAT) 2.5-2.5 MCG/ACT AERS Inhale 1 puff into the lungs daily.     No current facility-administered medications for this visit.     Allergies  Allergen Reactions  . No Known Allergies     Review of Systems  No hemoptysis or problems following bronchoscopy  BP 119/76 (BP Location: Right Arm, Patient Position: Sitting, Cuff Size: Normal)   Pulse 100   Resp 20   Ht '5\' 7"'$  (1.702 m)   Wt 140 lb (63.5 kg)   SpO2 93% Comment: RA  BMI 21.93 kg/m  Physical Exam Chronically ill with obvious severe COPD Distant breath sounds with scattered rhonchi Heart rate regular No peripheral edema  Diagnostic Tests: Biopsy results discussed with patient  Impression: Right middle lobe nodular density with mild-moderate activity on PET scan. Transbronchial biopsies are negative. We'll proceed with a transthoracic needle biopsy CT-guided by interventional radiology  Plan:  Return for discussion of a transthoracic needle biopsy Len Childs, MD [facility] [phone]

## 2015-11-16 ENCOUNTER — Telehealth: Payer: Self-pay | Admitting: *Deleted

## 2015-11-16 NOTE — Telephone Encounter (Signed)
I called Courtney Zuniga per the request of Dr. Prescott Gum to inform her that the scheduled needle biopsy of her lung has bee cancelled. The radiololgist feels it is too small. I told her that Dr. Prescott Gum and other doctors are going to discuss it further at a thoracic conference in a couple of weeks and we would call her back after that meeting. She understood and was glad she didn't have to have the biopsy.

## 2015-11-29 ENCOUNTER — Other Ambulatory Visit: Payer: Self-pay | Admitting: *Deleted

## 2015-11-29 DIAGNOSIS — R918 Other nonspecific abnormal finding of lung field: Secondary | ICD-10-CM

## 2015-12-07 ENCOUNTER — Ambulatory Visit
Admission: RE | Admit: 2015-12-07 | Discharge: 2015-12-07 | Disposition: A | Discharge status: Home / Self Care | Payer: Medicare HMO | Source: Ambulatory Visit | Attending: Cardiothoracic Surgery | Admitting: Cardiothoracic Surgery

## 2015-12-07 DIAGNOSIS — R918 Other nonspecific abnormal finding of lung field: Secondary | ICD-10-CM

## 2015-12-10 ENCOUNTER — Encounter: Payer: Medicare HMO | Admitting: Cardiothoracic Surgery

## 2015-12-18 ENCOUNTER — Ambulatory Visit: Payer: Medicare HMO | Admitting: Thoracic Surgery (Cardiothoracic Vascular Surgery)

## 2015-12-25 ENCOUNTER — Ambulatory Visit: Payer: Medicare HMO | Admitting: Thoracic Surgery (Cardiothoracic Vascular Surgery)

## 2015-12-26 ENCOUNTER — Encounter: Payer: Self-pay | Admitting: Thoracic Surgery (Cardiothoracic Vascular Surgery)

## 2015-12-26 ENCOUNTER — Other Ambulatory Visit: Payer: Self-pay | Admitting: *Deleted

## 2015-12-26 ENCOUNTER — Ambulatory Visit (INDEPENDENT_AMBULATORY_CARE_PROVIDER_SITE_OTHER): Payer: Medicare HMO | Admitting: Thoracic Surgery (Cardiothoracic Vascular Surgery)

## 2015-12-26 VITALS — BP 134/87 | HR 80 | Resp 20 | Ht 67.0 in | Wt 140.0 lb

## 2015-12-26 DIAGNOSIS — R911 Solitary pulmonary nodule: Secondary | ICD-10-CM

## 2015-12-26 NOTE — Progress Notes (Signed)
[address]       [place] [address]             [phone]       HPI: Courtney Zuniga is a 65 year old woman sent for consultation for consideration of navigational bronchoscopy.  Courtney Zuniga is a 66 year old woman with a history of tobacco abuse and COPD, who is on oxygen at home. She uses multiple inhalers and has frequent COPD exacerbations. She had a screening low dose CT scan of the chest which revealed a 2.2 cm right middle lobe nodule with associated anterior medial right middle lobe atelectasis. A PET CT showed the nodule was hypermetabolic with an SUV of 26. There were low-level hypermetabolic lymph nodes in the right hilar and subcarinal stations.  She saw Dr. Prescott Gum. He did not think she was a candidate for surgical resection and recommended bronchoscopy and endobronchial ultrasound. That was done on 11/08/2015. Unfortunately it was nondiagnostic. Radiology did not feel that this was approachable with a CT-guided needle biopsy. Therefore she is referred for consideration for navigational bronchoscopy.  Past Medical History:  Diagnosis Date  . Anemia    as a young woman  . Arthritis    osteoartritis  . Asthma   . Brain tumor (benign) ([place]) 2005 [facility]   Benign  . Chronic headaches   . Chronic hip pain   . Chronic pain   . COPD (chronic obstructive pulmonary disease) ([place])   . Coronary artery disease   . Depression   . Hypertension   . NSTEMI (non-ST elevated myocardial infarction) ([place])   . On home O2    qhs  . Pneumonia   . Shortness of breath dyspnea    Past Surgical History:  Procedure Laterality Date  . CHOLECYSTECTOMY    . COLONOSCOPY  2015   Results requested from [facility]  . COLONOSCOPY    . ESOPHAGOGASTRODUODENOSCOPY N/A 08/14/2015   Procedure: ESOPHAGOGASTRODUODENOSCOPY (EGD);  Surgeon: Daneil Dolin, MD;  [facility];  Service: Endoscopy;  Laterality: N/A;  215   . ESOPHAGOGASTRODUODENOSCOPY (EGD)  WITH PROPOFOL N/A 09/13/2015   Procedure: ESOPHAGOGASTRODUODENOSCOPY (EGD) WITH PROPOFOL;  Surgeon: Milus Banister, MD;  [facility];  Service: Endoscopy;  Laterality: N/A;  . TUMOR REMOVAL  2005   Benign  . UPPER ESOPHAGEAL ENDOSCOPIC ULTRASOUND (EUS)  09/13/2015   Procedure: UPPER ESOPHAGEAL ENDOSCOPIC ULTRASOUND (EUS);  Surgeon: Milus Banister, MD;  Location: [facility];  Service: Endoscopy;;  . VIDEO BRONCHOSCOPY WITH ENDOBRONCHIAL ULTRASOUND N/A 11/08/2015   Procedure: VIDEO BRONCHOSCOPY WITH ENDOBRONCHIAL ULTRASOUND;  Surgeon: Ivin Poot, MD;  Location: [facility] OR;  Service: Thoracic;  Laterality: N/A;     Current Outpatient Prescriptions  Medication Sig Dispense Refill  . albuterol (PROVENTIL HFA;VENTOLIN HFA) 108 (90 BASE) MCG/ACT inhaler Inhale 2 puffs into the lungs every 6 (six) hours as needed for shortness of breath.     Marland Kitchen albuterol (PROVENTIL) (2.5 MG/3ML) 0.083% nebulizer solution Take 2.5 mg by nebulization every 6 (six) hours as needed for wheezing or shortness of breath.    . calcium-vitamin D 250-100 MG-UNIT per tablet Take 1 tablet by mouth daily.    . Cholecalciferol (VITAMIN D3) 5000 units TABS Take 1 tablet by mouth daily.    . cyclobenzaprine (FLEXERIL) 5 MG tablet Take 5 mg by mouth 3 (three) times daily.    . Fluticasone-Salmeterol (ADVAIR) 500-50 MCG/DOSE AEPB Inhale 1 puff into the lungs every 12 (twelve) hours.    Marland Kitchen  HYDROcodone-acetaminophen (NORCO/VICODIN) 5-325 MG tablet Take 1 tablet by mouth every 6 (six) hours as needed. (Patient taking differently: Take 1 tablet by mouth every 6 (six) hours as needed (For pain.). ) 20 tablet 0  . lisinopril (PRINIVIL,ZESTRIL) 40 MG tablet Take 40 mg by mouth daily.    . nitroGLYCERIN (NITROSTAT) 0.4 MG SL tablet Place 0.4 mg under the tongue every 5 (five) minutes as needed for chest pain.    Marland Kitchen QVAR 80 MCG/ACT inhaler Inhale 1 puff into the lungs 2 (two) times daily as needed.     . Tiotropium Bromide-Olodaterol  (STIOLTO RESPIMAT) 2.5-2.5 MCG/ACT AERS Inhale 1 puff into the lungs daily.     No current facility-administered medications for this visit.    Social History   Social History  . Marital status: Legally Separated    Spouse name: N/A  . Number of children: N/A  . Years of education: 10th grade   Occupational History  . retired    Social History Main Topics  . Smoking status: Current Every Day Smoker    Packs/day: 0.25    Types: Cigarettes  . Smokeless tobacco: Never Used     Comment: Pt has asked doctor for med to help   . Alcohol use No  . Drug use: No  . Sexual activity: No   Other Topics Concern  . Not on file   Social History Narrative  . No narrative on file   Family History  Problem Relation Age of Onset  . Ovarian cancer Mother   . Lung cancer Father   . Lung cancer Brother   . Lung cancer Brother   . Prostate cancer Brother   . Brain cancer Sister     Half sister   Physical Exam BP 134/87 (BP Location: Right Arm, Patient Position: Sitting, Cuff Size: Normal)   Pulse 80   Resp 20   Ht '5\' 7"'$  (1.702 m)   Wt 140 lb (63.5 kg)   SpO2 97%   BMI 21.93 kg/m  Chronically ill-appearing 65 year old woman in no acute distress Alert and oriented 3 with no focal neurologic deficits HEENT: EOMI, pupils reactive and equal Neck no adenopathy, trachea midline Cardiac regular rate and rhythm normal S1 and S2 no rubs or gallops Lungs with faint rhonchi bilaterally, no wheezing Abdomen soft nontender  Extremities are without clubbing cyanosis or edema  Diagnostic Tests: CT CHEST WITHOUT CONTRAST  TECHNIQUE: Multidetector CT imaging of the chest was performed using thin slice collimation for electromagnetic bronchoscopy planning purposes, without intravenous contrast.  COMPARISON:  None.  FINDINGS: Cardiovascular: Heart is normal in size.  No pericardial effusion.  Coronary atherosclerosis in the LAD and right coronary artery.  Atherosclerotic  calcifications of the aortic arch.  Mediastinum/Nodes: No suspicious mediastinal adenopathy.  Visualized thyroid is unremarkable.  Lungs/Pleura: 2.7 x 1.8 cm nodule in the central right middle lobe (series 4/ image 79), unchanged, hypermetabolic on prior PET and suspicious for primary bronchogenic neoplasm. Associated atelectasis/collapse in the right middle lobe (series 4/image 88), increased.  Stable 5 mm nodule in the central left upper lobe (series 4/ image 45), unchanged. Mild lingular scarring/atelectasis.  Moderate centrilobular and paraseptal emphysematous changes, upper lobe predominant.  No pleural effusion or pneumothorax.  Upper Abdomen: Visualized upper abdomen is notable for cholecystectomy clips.  Musculoskeletal: Visualized osseous structures are within normal limits.  IMPRESSION: Stable 2.7 x 1.8 cm central right middle lobe nodule, hypermetabolic on prior PET, suspicious for primary bronchogenic neoplasm. Associated atelectasis/ collapse in the right  middle lobe, increased.  Stable 5 mm nodule in the central left upper lobe, unchanged.   Electronically Signed   By: Julian Hy M.D.   On: 12/07/2015 12:57 I personally reviewed CT chest and also reviewed her PET/CT. I concur with the findings noted above.  Impression: Courtney Zuniga is a 65 year old woman with a history of ongoing tobacco abuse who has a 2.7 x 1.8 cm right middle lobe nodule that is markedly hypermetabolic on PET and highly suspicious for new primary bronchogenic carcinoma. She had bronchoscopy and endobronchial ultrasound in late July that was nondiagnostic. The lesion is not amenable to percutaneous biopsy.  I recommended that we do electromagnetic navigational bronchoscopy for biopsy and also possible endobronchial ultrasound. This will be done in the operating room under general anesthesia on an outpatient basis. From her standpoint the procedure should be much like her  previous bronchoscopy and endobronchial ultrasound. She is familiar with the general nature of the procedure. I again reviewed the indications, risks, benefits, and alternatives. She understands risks include those general anesthesia. She understands the risks include, but are not limited to death, MI, DVT, PE, bleeding, pneumothorax, failure to make a diagnosis, as well as the possibility of other unforeseeable complications. She does understand that no guarantee can be made positive diagnosis even with the addition of navigation.  Plan: Navigational bronchoscopy and endobronchial ultrasound on Monday, 12/31/2015  Melrose Nakayama, MD [facility] [phone]

## 2015-12-28 ENCOUNTER — Encounter (HOSPITAL_COMMUNITY): Payer: Self-pay | Admitting: *Deleted

## 2015-12-28 NOTE — Progress Notes (Signed)
Anesthesia Chart Review: SAME DAY WORK-UP.  Patient is a 65 year old female scheduled for video bronchoscopy with endobronchial navigation and endobronchial ultrasound on 12/31/2015 by Dr. Roxan Hockey. She has a hypermetabolic RML nodule with low level hypermetabolic lymph nodes in the right hilar and subcarinal stations. She is s/p video bronchoscopy with EBUS on 11/08/15 (Dr. Prescott Gum), but results were non-diagnostic, so she was referred to Dr. Roxan Hockey for navigational bronchoscopy.  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 CAD and NSTEMI were added to her history by nursing staff in 2015. Patient has had 14 troponins drawn between 12/29/11 and 11/16/14 that have all been negative. Patient told our PAT RN that she was diagnosed for MI based on her EKG findings (that show anteroseptal infarct). She has evidence or coronary calcifications on chest CT imaging. Negative stress tests in 2009 and 2014. She denied history of cardiac cath.   PCP is listed as Barry Dienes, NP. She is not currently followed by a cardiologist, but was seen by Dr. Trula Slade [facility]; see Care Everywhere) on 03/03/13 for evaluation of chest pain and DOE. She described "...worsening (more frequent, longer lasting) chest pain around the left side chest and shoulder. It was dullness and heaviness feeling. It occurs almost every other night recently. It can last 15-30 minutes vary. It can be associated with shortness of breath, diaphoresis and nausea. It usually resolve after certain period of resting." She was started on Imdur while awaiting stress test. A stress test was ordered which was non-ischemic. She later saw Dr. Carlyle Dolly during admission for chest pain 02/2014. Symptoms were felt atypical. Troponins negative. Echo done, but no other testing recommended. She told  our phone PAT RN that she continues to have intermittent chest pains. I called and spoke with question to clarify. She says that for at least > 2 years she gets intermittent sharp chest pains that feel like a "catach" and then move below her rib cage prior to dissipating. She does not associate them with activity--"can occur any time." Can be brought on after swallowing (can hurt to swallow and feels like food is catching). When she had Nitro, she would occasional use with some relief but also had relief with baking soda and/or rest. She often will walk around trying to belch to relieve her symptoms. She also has bilateral shoulder pain (R>L) that has been present for at least two years that is better with muscle relaxants or Icy Hot. The most activity she gets is walking to her mail box. She will get SOB and palpitations with this level of activity, but does not notice CP. It takes her 5-10 minutes after resting for her heart racing and SOB to completely resolve. This also is not new. Symptoms may occur 2-3 times per months. She does not feel that symptoms are worse/progressive in at least the past year. No new symptoms from when she had her surgery in July.   Meds albuterol, BC Headache Powder, Advair (not used in a while), Norco, lisinopril, Protonix, Nitro (ran out months ago; PCP hasn't renewed), Qvar, Stioloto.  09/04/15 EKG: SR, anteroseptal infarct (age undetermined), when compared to 11/16/14 tracing, no significant change was found.  05/11/15 Echo (ordered by Markus Jarvis, NP; Dx: murmur): Study Conclusions - Left ventricle: The cavity size was normal. Systolic function was   normal. The estimated ejection fraction was in the range of 60%  to 65%. Wall motion was normal; there were no regional wall   motion abnormalities. - Aortic valve: Valve area (VTI): 2.27 cm^2. Valve area (Vmax):   2.05 cm^2. Valve area (Vmean): 2.1 cm^2. - Atrial septum: No defect or patent foramen ovale was  identified.  04/01/13 PET Myocardial Perfusion study [facility]; Care Everywhere): Impressions: - No evidence for significant ischemia or scar is noted. - During stress: Global systolic function is normal. The ejection fraction  calculated at 59%.  - Coronary calcification are noted. - Incidentally noted are emphysematous changes and a 4 mm nodule in the  right middle lobe. Comparison with outside films is recommended if  available. If not available, then by Fleischner criteria a repeat  dedicated chest CT is recommend in 6-12 months.  12/07/15 CT Super D Chest: FINDINGS: - Cardiovascular: Heart is normal in size.  No pericardial effusion. - Coronary atherosclerosis in the LAD and right coronary artery. - Atherosclerotic calcifications of the aortic arch. - Mediastinum/Nodes: No suspicious mediastinal adenopathy. - Visualized thyroid is unremarkable. - Lungs/Pleura: 2.7 x 1.8 cm nodule in the central right middle lobe (series 4/ image 79), unchanged, hypermetabolic on prior PET and suspicious for primary bronchogenic neoplasm. Associated atelectasis/collapse in the right middle lobe (series 4/image 88), increased. - Stable 5 mm nodule in the central left upper lobe (series 4/ image 45), unchanged. Mild lingular scarring/atelectasis. - Moderate centrilobular and paraseptal emphysematous changes, upper lobe predominant. - No pleural effusion or pneumothorax. - Upper Abdomen: Visualized upper abdomen is notable for cholecystectomy clips. - Musculoskeletal: Visualized osseous structures are within normal limits. IMPRESSION: Stable 2.7 x 1.8 cm central right middle lobe nodule, hypermetabolic on prior PET, suspicious for primary bronchogenic neoplasm. Associated atelectasis/ collapse in the right middle lobe, increased. Stable 5 mm nodule in the central left upper lobe, unchanged.  10/26/15 Spirometry: FVC 2.94 (104%), FEV1 1.28 (57%), FEF25-75% 0.49 (23%).    She will need labs on  arrival.   Discussed above with anesthesiologist Dr. Ermalene Postin. Patient with CAD risk factors and coronary calcifications on CT. She reports stable chest symptoms for > 2 years now. Saw cardiologists in 2014 and 2015 with negative stress test 02/2013. Recent echo ok. She tolerated similar procedure two months ago. Will repeat an EKG on arrival to evaluate for any changes. Further evaluation as well on the day of surgery by her anesthesiologist. If EKG stable and no new/progressive CV symptoms then can likely proceed as planned.   George Hugh [facility] Short Stay Center/Anesthesiology Phone [phone] 12/28/2015 1:35 PM

## 2015-12-28 NOTE — Progress Notes (Signed)
Pt has history of MI (found on EKG, pt was not aware at the time she had had one). Pt sees Barry Dienes, [place] for heart and pulmonary problems. Pt states last week she had chest pain off and on for several days. Describes it as a catch feeling in her chest, sharp, stabbing pain at times, left arm and shoulder would hurt and at times would get diaphoretic. She never went anywhere for the pain or called her doctor. She states she would sometimes just lay down and the pain would go away or sometimes she felt like it was gas and would take baking soda and she would burp and she would feel better. She also attributed the arm pain to arthritis. No pain since Thursday, 12/20/15.

## 2015-12-31 ENCOUNTER — Ambulatory Visit (HOSPITAL_COMMUNITY): Payer: Medicare HMO | Admitting: Vascular Surgery

## 2015-12-31 ENCOUNTER — Ambulatory Visit (HOSPITAL_COMMUNITY)
Admission: RE | Admit: 2015-12-31 | Discharge: 2015-12-31 | Disposition: A | Discharge status: Home / Self Care | Payer: Medicare HMO | Source: Ambulatory Visit | Attending: Thoracic Surgery (Cardiothoracic Vascular Surgery) | Admitting: Thoracic Surgery (Cardiothoracic Vascular Surgery)

## 2015-12-31 ENCOUNTER — Encounter (HOSPITAL_COMMUNITY)
Admission: RE | Disposition: A | Discharge status: Home / Self Care | Payer: Self-pay | Source: Ambulatory Visit | Attending: Thoracic Surgery (Cardiothoracic Vascular Surgery)

## 2015-12-31 ENCOUNTER — Ambulatory Visit (HOSPITAL_COMMUNITY): Payer: Medicare HMO

## 2015-12-31 ENCOUNTER — Encounter (HOSPITAL_COMMUNITY): Payer: Self-pay | Admitting: General Practice

## 2015-12-31 DIAGNOSIS — M1990 Unspecified osteoarthritis, unspecified site: Secondary | ICD-10-CM | POA: Diagnosis not present

## 2015-12-31 DIAGNOSIS — I252 Old myocardial infarction: Secondary | ICD-10-CM | POA: Insufficient documentation

## 2015-12-31 DIAGNOSIS — C342 Malignant neoplasm of middle lobe, bronchus or lung: Secondary | ICD-10-CM | POA: Insufficient documentation

## 2015-12-31 DIAGNOSIS — Z419 Encounter for procedure for purposes other than remedying health state, unspecified: Secondary | ICD-10-CM

## 2015-12-31 DIAGNOSIS — F329 Major depressive disorder, single episode, unspecified: Secondary | ICD-10-CM | POA: Diagnosis not present

## 2015-12-31 DIAGNOSIS — R911 Solitary pulmonary nodule: Secondary | ICD-10-CM

## 2015-12-31 DIAGNOSIS — F17200 Nicotine dependence, unspecified, uncomplicated: Secondary | ICD-10-CM | POA: Diagnosis not present

## 2015-12-31 DIAGNOSIS — J45909 Unspecified asthma, uncomplicated: Secondary | ICD-10-CM | POA: Diagnosis not present

## 2015-12-31 DIAGNOSIS — I2510 Atherosclerotic heart disease of native coronary artery without angina pectoris: Secondary | ICD-10-CM | POA: Diagnosis not present

## 2015-12-31 DIAGNOSIS — J449 Chronic obstructive pulmonary disease, unspecified: Secondary | ICD-10-CM | POA: Diagnosis not present

## 2015-12-31 DIAGNOSIS — Z9981 Dependence on supplemental oxygen: Secondary | ICD-10-CM | POA: Diagnosis not present

## 2015-12-31 HISTORY — PX: VIDEO BRONCHOSCOPY WITH ENDOBRONCHIAL NAVIGATION: SHX6175

## 2015-12-31 LAB — COMPREHENSIVE METABOLIC PANEL
ALK PHOS: 178 U/L — AB (ref 38–126)
ALT: 10 U/L — AB (ref 14–54)
ANION GAP: 10 (ref 5–15)
AST: 20 U/L (ref 15–41)
Albumin: 4.1 g/dL (ref 3.5–5.0)
BILIRUBIN TOTAL: 0.5 mg/dL (ref 0.3–1.2)
BUN: 12 mg/dL (ref 6–20)
CALCIUM: 9.4 mg/dL (ref 8.9–10.3)
CO2: 24 mmol/L (ref 22–32)
CREATININE: 0.68 mg/dL (ref 0.44–1.00)
Chloride: 110 mmol/L (ref 101–111)
Glucose, Bld: 77 mg/dL (ref 65–99)
Potassium: 3.7 mmol/L (ref 3.5–5.1)
SODIUM: 144 mmol/L (ref 135–145)
TOTAL PROTEIN: 6.9 g/dL (ref 6.5–8.1)

## 2015-12-31 LAB — CBC
HEMATOCRIT: 43.4 % (ref 36.0–46.0)
HEMOGLOBIN: 14.7 g/dL (ref 12.0–15.0)
MCH: 29.3 pg (ref 26.0–34.0)
MCHC: 33.9 g/dL (ref 30.0–36.0)
MCV: 86.5 fL (ref 78.0–100.0)
Platelets: 198 10*3/uL (ref 150–400)
RBC: 5.02 MIL/uL (ref 3.87–5.11)
RDW: 14.5 % (ref 11.5–15.5)
WBC: 8.6 10*3/uL (ref 4.0–10.5)

## 2015-12-31 LAB — PROTIME-INR
INR: 1.08
PROTHROMBIN TIME: 14.1 s (ref 11.4–15.2)

## 2015-12-31 LAB — APTT: aPTT: 38 seconds — ABNORMAL HIGH (ref 24–36)

## 2015-12-31 SURGERY — VIDEO BRONCHOSCOPY WITH ENDOBRONCHIAL NAVIGATION
Anesthesia: General

## 2015-12-31 MED ORDER — ONDANSETRON HCL 4 MG/2ML IJ SOLN
INTRAMUSCULAR | Status: AC
  Filled 2015-12-31: qty 4

## 2015-12-31 MED ORDER — GLYCOPYRROLATE 0.2 MG/ML IV SOSY
PREFILLED_SYRINGE | INTRAVENOUS | Status: AC
  Filled 2015-12-31: qty 3

## 2015-12-31 MED ORDER — ACETAMINOPHEN 325 MG PO TABS
ORAL_TABLET | ORAL | Status: DC
Start: 2015-12-31 — End: 2015-12-31
  Filled 2015-12-31: qty 2

## 2015-12-31 MED ORDER — LIDOCAINE 2% (20 MG/ML) 5 ML SYRINGE
INTRAMUSCULAR | Status: AC
  Filled 2015-12-31: qty 15

## 2015-12-31 MED ORDER — ONDANSETRON HCL 4 MG/2ML IJ SOLN
INTRAMUSCULAR | Status: AC
  Filled 2015-12-31: qty 2

## 2015-12-31 MED ORDER — PROPOFOL 10 MG/ML IV BOLUS
INTRAVENOUS | Status: AC
  Filled 2015-12-31: qty 20

## 2015-12-31 MED ORDER — SUCCINYLCHOLINE CHLORIDE 200 MG/10ML IV SOSY
PREFILLED_SYRINGE | INTRAVENOUS | Status: AC
  Filled 2015-12-31: qty 10

## 2015-12-31 MED ORDER — DEXAMETHASONE SODIUM PHOSPHATE 10 MG/ML IJ SOLN
INTRAMUSCULAR | Status: AC
Start: 2015-12-31 — End: 2015-12-31
  Filled 2015-12-31: qty 2

## 2015-12-31 MED ORDER — FENTANYL CITRATE (PF) 100 MCG/2ML IJ SOLN: 25.0000 ug | INTRAMUSCULAR | Status: DC | PRN

## 2015-12-31 MED ORDER — ARTIFICIAL TEARS OP OINT
TOPICAL_OINTMENT | OPHTHALMIC | Status: AC
  Filled 2015-12-31: qty 3.5

## 2015-12-31 MED ORDER — 0.9 % SODIUM CHLORIDE (POUR BTL) OPTIME
TOPICAL | Status: DC | PRN
  Administered 2015-12-31: 1000 mL

## 2015-12-31 MED ORDER — ACETAMINOPHEN 325 MG PO TABS
650.0000 mg | ORAL_TABLET | Freq: Four times a day (QID) | ORAL | Status: DC | PRN
  Administered 2015-12-31: 650 mg via ORAL

## 2015-12-31 MED ORDER — SUGAMMADEX SODIUM 200 MG/2ML IV SOLN
INTRAVENOUS | Status: AC
  Filled 2015-12-31: qty 2

## 2015-12-31 MED ORDER — PROPOFOL 10 MG/ML IV BOLUS
INTRAVENOUS | Status: DC | PRN
  Administered 2015-12-31: 100 mg via INTRAVENOUS
  Administered 2015-12-31: 30 mg via INTRAVENOUS

## 2015-12-31 MED ORDER — ROCURONIUM BROMIDE 100 MG/10ML IV SOLN
INTRAVENOUS | Status: DC | PRN
  Administered 2015-12-31: 40 mg via INTRAVENOUS
  Administered 2015-12-31: 10 mg via INTRAVENOUS

## 2015-12-31 MED ORDER — LIDOCAINE 2% (20 MG/ML) 5 ML SYRINGE
INTRAMUSCULAR | Status: AC
  Filled 2015-12-31: qty 5

## 2015-12-31 MED ORDER — MIDAZOLAM HCL 2 MG/2ML IJ SOLN
INTRAMUSCULAR | Status: AC
  Filled 2015-12-31: qty 2

## 2015-12-31 MED ORDER — DEXAMETHASONE SODIUM PHOSPHATE 10 MG/ML IJ SOLN
INTRAMUSCULAR | Status: DC | PRN
  Administered 2015-12-31: 5 mg via INTRAVENOUS

## 2015-12-31 MED ORDER — LACTATED RINGERS IV SOLN
INTRAVENOUS | Status: DC | PRN
  Administered 2015-12-31: 10:00:00 via INTRAVENOUS

## 2015-12-31 MED ORDER — PHENYLEPHRINE HCL 10 MG/ML IJ SOLN
INTRAVENOUS | Status: DC | PRN
  Administered 2015-12-31: 10 ug/min via INTRAVENOUS

## 2015-12-31 MED ORDER — FENTANYL CITRATE (PF) 100 MCG/2ML IJ SOLN
INTRAMUSCULAR | Status: DC | PRN
  Administered 2015-12-31 (×2): 25 ug via INTRAVENOUS
  Administered 2015-12-31: 50 ug via INTRAVENOUS

## 2015-12-31 MED ORDER — DEXAMETHASONE SODIUM PHOSPHATE 10 MG/ML IJ SOLN
INTRAMUSCULAR | Status: AC
  Filled 2015-12-31: qty 1

## 2015-12-31 MED ORDER — FENTANYL CITRATE (PF) 100 MCG/2ML IJ SOLN
INTRAMUSCULAR | Status: AC
  Filled 2015-12-31: qty 2

## 2015-12-31 MED ORDER — SUGAMMADEX SODIUM 200 MG/2ML IV SOLN
INTRAVENOUS | Status: DC | PRN
  Administered 2015-12-31: 200 mg via INTRAVENOUS

## 2015-12-31 MED ORDER — LIDOCAINE HCL (CARDIAC) 20 MG/ML IV SOLN
INTRAVENOUS | Status: DC | PRN
  Administered 2015-12-31 (×2): 50 mg via INTRAVENOUS

## 2015-12-31 MED ORDER — ONDANSETRON HCL 4 MG/2ML IJ SOLN
INTRAMUSCULAR | Status: DC | PRN
  Administered 2015-12-31: 4 mg via INTRAVENOUS

## 2015-12-31 MED ORDER — LACTATED RINGERS IV SOLN
INTRAVENOUS | Status: DC
  Administered 2015-12-31: 10:00:00 via INTRAVENOUS

## 2015-12-31 SURGICAL SUPPLY — 49 items
ADAPTER BRONCH F/PENTAX (ADAPTER) ×3 IMPLANT
BRUSH BIOPSY BRONCH 10 SDTNB (MISCELLANEOUS) ×2 IMPLANT
BRUSH BIOPSY BRONCH 10MM SDTNB (MISCELLANEOUS) ×1
BRUSH CYTOL CELLEBRITY 1.5X140 (MISCELLANEOUS) IMPLANT
BRUSH SUPERTRAX BIOPSY (INSTRUMENTS) IMPLANT
BRUSH SUPERTRAX NDL-TIP CYTO (INSTRUMENTS) ×3 IMPLANT
CANISTER SUCTION 2500CC (MISCELLANEOUS) ×6 IMPLANT
CHANNEL WORK EXTEND EDGE 180 (KITS) IMPLANT
CHANNEL WORK EXTEND EDGE 45 (KITS) IMPLANT
CHANNEL WORK EXTEND EDGE 90 (KITS) IMPLANT
CONT SPEC 4OZ CLIKSEAL STRL BL (MISCELLANEOUS) ×12 IMPLANT
COTTONBALL LRG STERILE PKG (GAUZE/BANDAGES/DRESSINGS) IMPLANT
COVER DOME SNAP 22 D (MISCELLANEOUS) ×3 IMPLANT
COVER TABLE BACK 60X90 (DRAPES) ×6 IMPLANT
FILTER STRAW FLUID ASPIR (MISCELLANEOUS) IMPLANT
FORCEPS BIOP RJ4 1.8 (CUTTING FORCEPS) IMPLANT
FORCEPS BIOP SUPERTRX PREMAR (INSTRUMENTS) ×3 IMPLANT
GAUZE SPONGE 4X4 12PLY STRL (GAUZE/BANDAGES/DRESSINGS) ×3 IMPLANT
GLOVE SURG SIGNA 7.5 PF LTX (GLOVE) ×6 IMPLANT
GLOVE SURG SS PI 7.0 STRL IVOR (GLOVE) ×3 IMPLANT
GOWN STRL REUS W/ TWL XL LVL3 (GOWN DISPOSABLE) ×2 IMPLANT
GOWN STRL REUS W/TWL XL LVL3 (GOWN DISPOSABLE) ×4
KIT CLEAN ENDO COMPLIANCE (KITS) ×12 IMPLANT
KIT PROCEDURE EDGE 180 (KITS) IMPLANT
KIT PROCEDURE EDGE 45 (KITS) IMPLANT
KIT PROCEDURE EDGE 90 (KITS) ×3 IMPLANT
KIT ROOM TURNOVER OR (KITS) ×6 IMPLANT
MARKER SKIN DUAL TIP RULER LAB (MISCELLANEOUS) ×6 IMPLANT
NEEDLE 22X1 1/2 (OR ONLY) (NEEDLE) IMPLANT
NEEDLE BIOPSY TRANSBRONCH 21G (NEEDLE) IMPLANT
NEEDLE BLUNT 18X1 FOR OR ONLY (NEEDLE) IMPLANT
NEEDLE EBUS SONO TIP PENTAX (NEEDLE) IMPLANT
NEEDLE SUPERTRX PREMARK BIOPSY (NEEDLE) ×6 IMPLANT
NS IRRIG 1000ML POUR BTL (IV SOLUTION) ×6 IMPLANT
OIL SILICONE PENTAX (PARTS (SERVICE/REPAIRS)) ×6 IMPLANT
PAD ARMBOARD 7.5X6 YLW CONV (MISCELLANEOUS) ×12 IMPLANT
PATCHES PATIENT (LABEL) ×9 IMPLANT
SYR 20CC LL (SYRINGE) ×6 IMPLANT
SYR 20ML ECCENTRIC (SYRINGE) ×6 IMPLANT
SYR 30ML LL (SYRINGE) ×3 IMPLANT
SYR 5ML LL (SYRINGE) ×6 IMPLANT
SYR 5ML LUER SLIP (SYRINGE) ×3 IMPLANT
SYR CONTROL 10ML LL (SYRINGE) IMPLANT
TOWEL OR 17X24 6PK STRL BLUE (TOWEL DISPOSABLE) ×6 IMPLANT
TRAP SPECIMEN MUCOUS 40CC (MISCELLANEOUS) ×6 IMPLANT
TUBE CONNECTING 20'X1/4 (TUBING) ×3
TUBE CONNECTING 20X1/4 (TUBING) ×6 IMPLANT
UNDERPAD 30X30 (UNDERPADS AND DIAPERS) ×3 IMPLANT
WATER STERILE IRR 1000ML POUR (IV SOLUTION) ×6 IMPLANT

## 2015-12-31 NOTE — Brief Op Note (Signed)
12/31/2015  1:00 PM  PATIENT:  Courtney Zuniga  65 y.o. 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   Delay start of Pharmacological VTE agent (>24hrs) due to surgical blood loss or risk of bleeding: not applicable

## 2015-12-31 NOTE — Transfer of Care (Signed)
Immediate Anesthesia Transfer of Care Note  Patient: Courtney Zuniga  Procedure(s) Performed: Procedure(s): VIDEO BRONCHOSCOPY WITH ENDOBRONCHIAL NAVIGATION (N/A)  Patient Location: PACU  Anesthesia Type:General  Level of Consciousness: awake, oriented, sedated, patient cooperative and responds to stimulation  Airway & Oxygen Therapy: Patient Spontanous Breathing and Patient connected to nasal cannula oxygen  Post-op Assessment: Report given to RN, Post -op Vital signs reviewed and stable, Patient moving all extremities and Patient moving all extremities X 4  Post vital signs: Reviewed and stable  Last Vitals:  Vitals:   12/31/15 0925  BP: (!) 174/97  Pulse: 72  Resp: 16  Temp: 36.7 C    Last Pain:  Vitals:   12/31/15 0925  TempSrc: Oral  PainSc: 6       Patients Stated Pain Goal: 4 (21/58/72 7618)  Complications: No apparent anesthesia complications

## 2015-12-31 NOTE — Anesthesia Preprocedure Evaluation (Signed)
Anesthesia Evaluation  Patient identified by MRN, date of birth, ID band Patient awake    Reviewed: Allergy & Precautions, NPO status , Patient's Chart, lab work & pertinent test results  History of Anesthesia Complications Negative for: history of anesthetic complications  Airway Mallampati: II  TM Distance: >3 FB Neck ROM: Full    Dental  (+) Edentulous Upper, Edentulous Lower   Pulmonary shortness of breath, asthma , COPD,  COPD inhaler and oxygen dependent, Current Smoker,    breath sounds clear to auscultation       Cardiovascular hypertension, Pt. on medications (-) angina+ CAD and + Past MI   Rhythm:Regular     Neuro/Psych  Headaches, PSYCHIATRIC DISORDERS Depression    GI/Hepatic negative GI ROS, Neg liver ROS,   Endo/Other  negative endocrine ROS  Renal/GU negative Renal ROS     Musculoskeletal  (+) Arthritis ,   Abdominal   Peds  Hematology  (+) anemia ,   Anesthesia Other Findings   Reproductive/Obstetrics                             Anesthesia Physical Anesthesia Plan  ASA: III  Anesthesia Plan: General   Post-op Pain Management:    Induction: Intravenous  Airway Management Planned: Oral ETT  Additional Equipment: None  Intra-op Plan:   Post-operative Plan: Extubation in OR  Informed Consent: I have reviewed the patients History and Physical, chart, labs and discussed the procedure including the risks, benefits and alternatives for the proposed anesthesia with the patient or authorized representative who has indicated his/her understanding and acceptance.   Dental advisory given  Plan Discussed with: CRNA and Surgeon  Anesthesia Plan Comments:         Anesthesia Quick Evaluation

## 2015-12-31 NOTE — Anesthesia Postprocedure Evaluation (Signed)
Anesthesia Post Note  Patient: Courtney Zuniga  Procedure(s) Performed: Procedure(s) (LRB): VIDEO BRONCHOSCOPY WITH ENDOBRONCHIAL NAVIGATION (N/A)  Patient location during evaluation: PACU Anesthesia Type: General Level of consciousness: awake Pain management: pain level controlled Vital Signs Assessment: post-procedure vital signs reviewed and stable Respiratory status: spontaneous breathing Cardiovascular status: stable Postop Assessment: no signs of nausea or vomiting Anesthetic complications: no    Last Vitals:  Vitals:   12/31/15 1200 12/31/15 1218  BP: (!) 158/92 (!) 160/95  Pulse:  84  Resp:    Temp: 36.1 C 36.4 C    Last Pain:  Vitals:   12/31/15 1210  TempSrc:   PainSc: 2                  Deontez Klinke

## 2015-12-31 NOTE — H&P (View-Only) (Signed)
[address]       [place] [address]             [phone]       HPI: [name] is a 65 year old woman sent for consultation for consideration of navigational bronchoscopy.  [name] is a 65 year old woman with a history of tobacco abuse and COPD, who is on oxygen at home. She uses multiple inhalers and has frequent COPD exacerbations. She had a screening low dose CT scan of the chest which revealed a 2.2 cm right middle lobe nodule with associated anterior medial right middle lobe atelectasis. A PET CT showed the nodule was hypermetabolic with an SUV of 26. There were low-level hypermetabolic lymph nodes in the right hilar and subcarinal stations.  She saw Dr. Prescott Gum. He did not think she was a candidate for surgical resection and recommended bronchoscopy and endobronchial ultrasound. That was done on 11/08/2015. Unfortunately it was nondiagnostic. Radiology did not feel that this was approachable with a CT-guided needle biopsy. Therefore she is referred for consideration for navigational bronchoscopy.  Past Medical History:  Diagnosis Date  . Anemia    as a young woman  . Arthritis    osteoartritis  . Asthma   . Brain tumor (benign) ([place]) 2005 [facility]   Benign  . Chronic headaches   . Chronic hip pain   . Chronic pain   . COPD (chronic obstructive pulmonary disease) ([place])   . Coronary artery disease   . Depression   . Hypertension   . NSTEMI (non-ST elevated myocardial infarction) ([place])   . On home O2    qhs  . Pneumonia   . Shortness of breath dyspnea    Past Surgical History:  Procedure Laterality Date  . CHOLECYSTECTOMY    . COLONOSCOPY  2015   Results requested from [facility]  . COLONOSCOPY    . ESOPHAGOGASTRODUODENOSCOPY N/A 08/14/2015   Procedure: ESOPHAGOGASTRODUODENOSCOPY (EGD);  Surgeon: Daneil Dolin, MD;  [facility];  Service: Endoscopy;  Laterality: N/A;  215   . ESOPHAGOGASTRODUODENOSCOPY (EGD)  WITH PROPOFOL N/A 09/13/2015   Procedure: ESOPHAGOGASTRODUODENOSCOPY (EGD) WITH PROPOFOL;  Surgeon: Milus Banister, MD;  Location: [facility];  Service: Endoscopy;  Laterality: N/A;  . TUMOR REMOVAL  2005   Benign  . UPPER ESOPHAGEAL ENDOSCOPIC ULTRASOUND (EUS)  09/13/2015   Procedure: UPPER ESOPHAGEAL ENDOSCOPIC ULTRASOUND (EUS);  Surgeon: Milus Banister, MD;  Location: [facility];  Service: Endoscopy;;  . VIDEO BRONCHOSCOPY WITH ENDOBRONCHIAL ULTRASOUND N/A 11/08/2015   Procedure: VIDEO BRONCHOSCOPY WITH ENDOBRONCHIAL ULTRASOUND;  Surgeon: Ivin Poot, MD;  [facility];  Service: Thoracic;  Laterality: N/A;     Current Outpatient Prescriptions  Medication Sig Dispense Refill  . albuterol (PROVENTIL HFA;VENTOLIN HFA) 108 (90 BASE) MCG/ACT inhaler Inhale 2 puffs into the lungs every 6 (six) hours as needed for shortness of breath.     Marland Kitchen albuterol (PROVENTIL) (2.5 MG/3ML) 0.083% nebulizer solution Take 2.5 mg by nebulization every 6 (six) hours as needed for wheezing or shortness of breath.    . calcium-vitamin D 250-100 MG-UNIT per tablet Take 1 tablet by mouth daily.    . Cholecalciferol (VITAMIN D3) 5000 units TABS Take 1 tablet by mouth daily.    . cyclobenzaprine (FLEXERIL) 5 MG tablet Take 5 mg by mouth 3 (three) times daily.    . Fluticasone-Salmeterol (ADVAIR) 500-50 MCG/DOSE AEPB Inhale 1 puff into the lungs every 12 (twelve) hours.    Marland Kitchen  HYDROcodone-acetaminophen (NORCO/VICODIN) 5-325 MG tablet Take 1 tablet by mouth every 6 (six) hours as needed. (Patient taking differently: Take 1 tablet by mouth every 6 (six) hours as needed (For pain.). ) 20 tablet 0  . lisinopril (PRINIVIL,ZESTRIL) 40 MG tablet Take 40 mg by mouth daily.    . nitroGLYCERIN (NITROSTAT) 0.4 MG SL tablet Place 0.4 mg under the tongue every 5 (five) minutes as needed for chest pain.    Marland Kitchen QVAR 80 MCG/ACT inhaler Inhale 1 puff into the lungs 2 (two) times daily as needed.     . Tiotropium Bromide-Olodaterol  (STIOLTO RESPIMAT) 2.5-2.5 MCG/ACT AERS Inhale 1 puff into the lungs daily.     No current facility-administered medications for this visit.    Social History   Social History  . Marital status: Legally Separated    Spouse name: N/A  . Number of children: N/A  . Years of education: 10th grade   Occupational History  . retired    Social History Main Topics  . Smoking status: Current Every Day Smoker    Packs/day: 0.25    Types: Cigarettes  . Smokeless tobacco: Never Used     Comment: Pt has asked doctor for med to help   . Alcohol use No  . Drug use: No  . Sexual activity: No   Other Topics Concern  . Not on file   Social History Narrative  . No narrative on file   Family History  Problem Relation Age of Onset  . Ovarian cancer Mother   . Lung cancer Father   . Lung cancer Brother   . Lung cancer Brother   . Prostate cancer Brother   . Brain cancer Sister     Half sister   Physical Exam BP 134/87 (BP Location: Right Arm, Patient Position: Sitting, Cuff Size: Normal)   Pulse 80   Resp 20   Ht '5\' 7"'$  (1.702 m)   Wt 140 lb (63.5 kg)   SpO2 97%   BMI 21.93 kg/m  Chronically ill-appearing 65 year old woman in no acute distress Alert and oriented 3 with no focal neurologic deficits HEENT: EOMI, pupils reactive and equal Neck no adenopathy, trachea midline Cardiac regular rate and rhythm normal S1 and S2 no rubs or gallops Lungs with faint rhonchi bilaterally, no wheezing Abdomen soft nontender  Extremities are without clubbing cyanosis or edema  Diagnostic Tests: CT CHEST WITHOUT CONTRAST  TECHNIQUE: Multidetector CT imaging of the chest was performed using thin slice collimation for electromagnetic bronchoscopy planning purposes, without intravenous contrast.  COMPARISON:  None.  FINDINGS: Cardiovascular: Heart is normal in size.  No pericardial effusion.  Coronary atherosclerosis in the LAD and right coronary artery.  Atherosclerotic  calcifications of the aortic arch.  Mediastinum/Nodes: No suspicious mediastinal adenopathy.  Visualized thyroid is unremarkable.  Lungs/Pleura: 2.7 x 1.8 cm nodule in the central right middle lobe (series 4/ image 79), unchanged, hypermetabolic on prior PET and suspicious for primary bronchogenic neoplasm. Associated atelectasis/collapse in the right middle lobe (series 4/image 88), increased.  Stable 5 mm nodule in the central left upper lobe (series 4/ image 45), unchanged. Mild lingular scarring/atelectasis.  Moderate centrilobular and paraseptal emphysematous changes, upper lobe predominant.  No pleural effusion or pneumothorax.  Upper Abdomen: Visualized upper abdomen is notable for cholecystectomy clips.  Musculoskeletal: Visualized osseous structures are within normal limits.  IMPRESSION: Stable 2.7 x 1.8 cm central right middle lobe nodule, hypermetabolic on prior PET, suspicious for primary bronchogenic neoplasm. Associated atelectasis/ collapse in the right  middle lobe, increased.  Stable 5 mm nodule in the central left upper lobe, unchanged.   Electronically Signed   By: Julian Hy M.D.   On: 12/07/2015 12:57 I personally reviewed CT chest and also reviewed her PET/CT. I concur with the findings noted above.  Impression: [name] is a 65 year old woman with a history of ongoing tobacco abuse who has a 2.7 x 1.8 cm right middle lobe nodule that is markedly hypermetabolic on PET and highly suspicious for new primary bronchogenic carcinoma. She had bronchoscopy and endobronchial ultrasound in late July that was nondiagnostic. The lesion is not amenable to percutaneous biopsy.  I recommended that we do electromagnetic navigational bronchoscopy for biopsy and also possible endobronchial ultrasound. This will be done in the operating room under general anesthesia on an outpatient basis. From her standpoint the procedure should be much like her  previous bronchoscopy and endobronchial ultrasound. She is familiar with the general nature of the procedure. I again reviewed the indications, risks, benefits, and alternatives. She understands risks include those general anesthesia. She understands the risks include, but are not limited to death, MI, DVT, PE, bleeding, pneumothorax, failure to make a diagnosis, as well as the possibility of other unforeseeable complications. She does understand that no guarantee can be made positive diagnosis even with the addition of navigation.  Plan: Navigational bronchoscopy and endobronchial ultrasound on Monday, 12/31/2015  Melrose Nakayama, MD [facility] [phone]

## 2015-12-31 NOTE — Anesthesia Procedure Notes (Addendum)
Procedure Name: Intubation Date/Time: 12/31/2015 10:21 AM Performed by: Jacquiline Doe A Pre-anesthesia Checklist: Patient identified, Emergency Drugs available, Suction available and Patient being monitored Patient Re-evaluated:Patient Re-evaluated prior to inductionOxygen Delivery Method: Circle System Utilized and Circle system utilized Preoxygenation: Pre-oxygenation with 100% oxygen Intubation Type: IV induction and Cricoid Pressure applied Ventilation: Mask ventilation without difficulty Laryngoscope Size: Mac and 3 Grade View: Grade I Tube type: Oral Tube size: 8.5 mm Number of attempts: 1 Airway Equipment and Method: Stylet,  Oral airway and LTA kit utilized Placement Confirmation: ETT inserted through vocal cords under direct vision,  positive ETCO2 and breath sounds checked- equal and bilateral Secured at: 21 cm Tube secured with: Tape Dental Injury: Teeth and Oropharynx as per pre-operative assessment

## 2015-12-31 NOTE — Discharge Instructions (Signed)
Do not drive or engage in heavy physical activity for 24 hours  You may resume normal activities tomorrow  You may cough up small amounts of blood over the next few days  You may use over the counter cough medication and/ or pain relievers, Acetaminophen (Tylenol) as needed  Call [phone] if you develop chest pain, shortness of breath or cough up more than 2 tablespoons of blood  Our office will contact you with follow up information

## 2015-12-31 NOTE — Interval H&P Note (Signed)
History and Physical Interval Note:  12/31/2015 9:45 AM  Courtney Zuniga  has presented today for surgery, with the diagnosis of RML NODULE  The various methods of treatment have been discussed with the patient and family. After consideration of risks, benefits and other options for treatment, the patient has consented to  Procedure(s): VIDEO BRONCHOSCOPY WITH ENDOBRONCHIAL NAVIGATION (N/A) VIDEO BRONCHOSCOPY WITH ENDOBRONCHIAL ULTRASOUND (N/A) as a surgical intervention .  The patient's history has been reviewed, patient examined, no change in status, stable for surgery.  I have reviewed the patient's chart and labs.  Questions were answered to the patient's satisfaction.     Melrose Nakayama

## 2016-01-01 ENCOUNTER — Encounter (HOSPITAL_COMMUNITY): Payer: Self-pay | Admitting: Thoracic Surgery (Cardiothoracic Vascular Surgery)

## 2016-01-01 NOTE — Op Note (Signed)
NAMEKENNEISHA, COCHRANE             ACCOUNT NO.:  [id number]  MEDICAL RECORD NO.:  [id number]  LOCATION:  [facility]                         FACILITY:  [facility]  PHYSICIAN:  Revonda Standard. Roxan Hockey, M.D.DATE OF BIRTH:  1950/12/24  DATE OF PROCEDURE:  12/31/2015 DATE OF DISCHARGE:  12/31/2015                              OPERATIVE REPORT   PREOPERATIVE DIAGNOSIS:  Right middle lobe lung nodule.  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  PROCEDURE:  Electromagnetic navigational bronchoscopy with brushings, biopsies and needle aspirations.  SURGEON:  Revonda Standard. Roxan Hockey, M.D.  ASSISTANT:  None.  ANESTHESIA:  General.  FINDINGS:  Navigated within 1 cm of the right middle lobe lesion, initial needle aspirations and brushings positive for poorly- differentiated carcinoma.  Minimal bleeding with biopsies.  CLINICAL NOTE:  Ms. Sole is a 65 year old woman with history of tobacco abuse and COPD.  She had a low-dose screening CT of the chest, which revealed a 2.2-cm right middle lobe nodule with associated right middle lobe atelectasis.  PET-CT showed the nodule was hypermetabolic with an SUV of 26.  She underwent bronchoscopy and endobronchial ultrasound, but it was nondiagnostic.  She was referred for CT-guided needle biopsy, but the lesion was too central and in too close of proximity to major vessels to attempt a CT-guided biopsy.  She then was referred for navigational bronchoscopy.  The indications, risks, benefits and alternatives were discussed in detail with the patient. Possibility of using the endobronchial ultrasound also was discussed. She understood and accepted the risks and agreed to proceed.  OPERATIVE NOTE:  Mrs. Piechowski was brought to the operating room on December 31, 2015.  She had induction of general anesthesia and was intubated.  Flexible fiberoptic bronchoscopy was performed via the endotracheal tube.  It revealed normal  endobronchial anatomy with no endobronchial lesions seen to the level of the subsegmental bronchi.  Registration then was performed based on the preoperative planning. There was good correlation of the video and virtual bronchoscopy. Bronchoscope was navigated to the orifice of the right middle lobe bronchus.  The locatable guide then was advanced into the appropriate subsegmental airway, it was within 1 cm and in close proximity to the nodule with good alignment.  Needle aspirations were performed and placed on slides.  Brushings then were performed with the regular needle brush as well as a triple brush.  These specimens were placed on slides as well.  These specimens then were sent for quick prep.  While awaiting those results, additional needle aspirations were performed with all of the material being placed into cytologic fluid for cell block and then multiple biopsies were obtained.  Periodically during the sampling, the locatable guide was reinserted to ensure continued proximity and alignment, all sampling was done with fluoroscopy and total of 3 minutes of fluoroscopy time was used with a dose of 23 mGy of radiation.  There was some mild bleeding with biopsies, which cleared with saline.  The initial brushings and needle aspirations returned showing a poorly- differentiated carcinoma, probable non-small cell carcinoma.  Further characterization will await permanent pathology.  Final inspection was made with the bronchoscope, there was no ongoing bleeding.  The scope was removed.  The patient was extubated in the operating room and taken to the postanesthetic care unit in good condition.     Revonda Standard Roxan Hockey, M.D.     SCH/MEDQ  D:  12/31/2015  T:  01/01/2016  Job:  [id number]

## 2016-01-03 ENCOUNTER — Telehealth: Payer: Self-pay | Admitting: *Deleted

## 2016-01-03 ENCOUNTER — Encounter: Payer: Self-pay | Admitting: *Deleted

## 2016-01-03 DIAGNOSIS — R918 Other nonspecific abnormal finding of lung field: Secondary | ICD-10-CM | POA: Insufficient documentation

## 2016-01-03 NOTE — Telephone Encounter (Signed)
Oncology Nurse Navigator Documentation  Oncology Nurse Navigator Flowsheets 01/03/2016  Navigator Encounter Type Telephone;Introductory phone call/I received a referral on Courtney Zuniga from Dr. Leonarda Salon office.  I called patient and set her up for [place] next week.  She verbalized understanding of appt time and place.   Telephone Outgoing Call  Treatment Phase Pre-Tx/Tx Discussion  Barriers/Navigation Needs Coordination of Care  Interventions Coordination of Care  Coordination of Care Appts  Acuity Level 2  Acuity Level 2 Assistance expediting appointments  Time Spent with Patient 30

## 2016-01-04 ENCOUNTER — Telehealth: Payer: Self-pay | Admitting: *Deleted

## 2016-01-04 NOTE — Telephone Encounter (Signed)
Mailed clinic letter to pt.  

## 2016-01-09 ENCOUNTER — Ambulatory Visit (INDEPENDENT_AMBULATORY_CARE_PROVIDER_SITE_OTHER): Payer: Medicare HMO | Admitting: Cardiothoracic Surgery

## 2016-01-09 ENCOUNTER — Encounter: Payer: Self-pay | Admitting: Cardiothoracic Surgery

## 2016-01-09 VITALS — BP 105/73 | HR 89 | Resp 18 | Ht 67.0 in | Wt 140.0 lb

## 2016-01-09 DIAGNOSIS — C342 Malignant neoplasm of middle lobe, bronchus or lung: Secondary | ICD-10-CM

## 2016-01-09 DIAGNOSIS — R911 Solitary pulmonary nodule: Secondary | ICD-10-CM

## 2016-01-09 DIAGNOSIS — Z9889 Other specified postprocedural states: Secondary | ICD-10-CM

## 2016-01-09 NOTE — Progress Notes (Signed)
PCP is Barry Dienes, NP Referring Provider is Barry Dienes, NP  Chief Complaint  Patient presents with  . Lung Lesion    s/p ENB with brushings biopsies and needle aspirations, discuss the results    HQI:ONGEXB follow-up after navigational bronchoscopy and biopsy of right middle lobe mass. 2.2 cm. Pathology shows non-small cell carcinoma. Patient has severe COPD and is not a candidate for lobectomy. The patient is scheduled to go to the multidisciplinary thoracic oncology clinic this week to review treatment options. The patient tolerated her last bronchoscopic biopsy without hemoptysis or pain.   Past Medical History:  Diagnosis Date  . Anemia    as a young woman  . Arthritis    osteoartritis  . Asthma   . Brain tumor (benign) ([place]) 2005 [facility]   Benign  . Chronic headaches   . Chronic hip pain   . Chronic pain   . COPD (chronic obstructive pulmonary disease) ([place])   . Coronary artery disease   . Depression   . Hypertension   . NSTEMI (non-ST elevated myocardial infarction) ([place])   . On home O2    qhs  . Pneumonia   . Shortness of breath dyspnea     Past Surgical History:  Procedure Laterality Date  . CHOLECYSTECTOMY    . COLONOSCOPY  2015   Results requested from [facility]  . COLONOSCOPY    . ESOPHAGOGASTRODUODENOSCOPY N/A 08/14/2015   Procedure: ESOPHAGOGASTRODUODENOSCOPY (EGD);  Surgeon: Daneil Dolin, MD;  [facility];  Service: Endoscopy;  Laterality: N/A;  215   . ESOPHAGOGASTRODUODENOSCOPY (EGD) WITH PROPOFOL N/A 09/13/2015   Procedure: ESOPHAGOGASTRODUODENOSCOPY (EGD) WITH PROPOFOL;  Surgeon: Milus Banister, MD;  Location: [facility];  Service: Endoscopy;  Laterality: N/A;  . TUMOR REMOVAL  2005   Benign  . UPPER ESOPHAGEAL ENDOSCOPIC ULTRASOUND (EUS)  09/13/2015   Procedure: UPPER ESOPHAGEAL ENDOSCOPIC ULTRASOUND (EUS);  Surgeon: Milus Banister, MD;  Location: [facility];  Service: Endoscopy;;  . VIDEO BRONCHOSCOPY WITH ENDOBRONCHIAL  NAVIGATION N/A 12/31/2015   Procedure: VIDEO BRONCHOSCOPY WITH ENDOBRONCHIAL NAVIGATION;  Surgeon: Melrose Nakayama, MD;  Location: [facility];  Service: Thoracic;  Laterality: N/A;  . VIDEO BRONCHOSCOPY WITH ENDOBRONCHIAL ULTRASOUND N/A 11/08/2015   Procedure: VIDEO BRONCHOSCOPY WITH ENDOBRONCHIAL ULTRASOUND;  Surgeon: Ivin Poot, MD;  Location: [facility] OR;  Service: Thoracic;  Laterality: N/A;    Family History  Problem Relation Age of Onset  . Ovarian cancer Mother   . Lung cancer Father   . Lung cancer Brother   . Lung cancer Brother   . Prostate cancer Brother   . Brain cancer Sister     Half sister    Social History Social History  Substance Use Topics  . Smoking status: Current Every Day Smoker    Packs/day: 0.25    Types: Cigarettes  . Smokeless tobacco: Never Used     Comment: Pt has asked doctor for med to help   . Alcohol use No    Current Outpatient Prescriptions  Medication Sig Dispense Refill  . albuterol (PROVENTIL HFA;VENTOLIN HFA) 108 (90 BASE) MCG/ACT inhaler Inhale 2 puffs into the lungs every 6 (six) hours as needed for shortness of breath.     Marland Kitchen albuterol (PROVENTIL) (2.5 MG/3ML) 0.083% nebulizer solution Take 2.5 mg by nebulization every 6 (six) hours as needed for wheezing or shortness of breath.    . Aspirin-Salicylamide-Caffeine (BC HEADACHE POWDER PO) Take 1 packet by mouth daily as needed (headache).    Marland Kitchen  calcium-vitamin D 250-100 MG-UNIT per tablet Take 1 tablet by mouth daily.    . Cholecalciferol (VITAMIN D3) 5000 units TABS Take 1 tablet by mouth daily.    . Fluticasone-Salmeterol (ADVAIR) 500-50 MCG/DOSE AEPB Inhale 1 puff into the lungs every 12 (twelve) hours.    Marland Kitchen HYDROcodone-acetaminophen (NORCO/VICODIN) 5-325 MG tablet Take 1 tablet by mouth every 6 (six) hours as needed. (Patient taking differently: Take 1 tablet by mouth every 6 (six) hours as needed (For pain.). ) 20 tablet 0  . lisinopril (PRINIVIL,ZESTRIL) 40 MG tablet Take 40 mg by mouth  daily.    . nitroGLYCERIN (NITROSTAT) 0.4 MG SL tablet Place 0.4 mg under the tongue every 5 (five) minutes as needed for chest pain.    . pantoprazole (PROTONIX) 40 MG tablet Take 40 mg by mouth daily.    Marland Kitchen QVAR 80 MCG/ACT inhaler Inhale 1 puff into the lungs 2 (two) times daily as needed.     . Tiotropium Bromide-Olodaterol (STIOLTO RESPIMAT) 2.5-2.5 MCG/ACT AERS Inhale 1 puff into the lungs daily.     No current facility-administered medications for this visit.     Allergies  Allergen Reactions  . No Known Allergies     Review of Systems patient will try to stop smoking.  She denies weight loss or fever  She denies bone pain   Physical Exam  lungs clear   heart rhythm regular, no murmur or rub No peripheral edema Neuro intact  Diagnostic Tests: no chest x-ray today   Impression:  non-small cell carcinoma right middle lobe probably a candidate for SBR T Pathology printed report provided to the patient  Plan: return as needed for future thoracic surgical issues in the course of treating her non-small cell carcinoma.   Len Childs, MD [facility] [phone]

## 2016-01-10 ENCOUNTER — Telehealth: Payer: Self-pay | Admitting: *Deleted

## 2016-01-10 ENCOUNTER — Other Ambulatory Visit (HOSPITAL_BASED_OUTPATIENT_CLINIC_OR_DEPARTMENT_OTHER): Payer: Medicare HMO

## 2016-01-10 ENCOUNTER — Ambulatory Visit
Admission: RE | Admit: 2016-01-10 | Discharge: 2016-01-10 | Disposition: A | Discharge status: Home / Self Care | Payer: Medicare HMO | Source: Ambulatory Visit | Attending: Radiation Oncology | Admitting: Radiation Oncology

## 2016-01-10 ENCOUNTER — Encounter: Payer: Self-pay | Admitting: *Deleted

## 2016-01-10 ENCOUNTER — Ambulatory Visit (HOSPITAL_BASED_OUTPATIENT_CLINIC_OR_DEPARTMENT_OTHER): Payer: Medicare HMO | Admitting: Internal Medicine

## 2016-01-10 ENCOUNTER — Telehealth: Payer: Self-pay | Admitting: Internal Medicine

## 2016-01-10 ENCOUNTER — Encounter: Payer: Self-pay | Admitting: Internal Medicine

## 2016-01-10 ENCOUNTER — Ambulatory Visit: Payer: Medicare HMO | Attending: Internal Medicine | Admitting: Physical Therapy

## 2016-01-10 ENCOUNTER — Encounter: Payer: Self-pay | Admitting: Medical Oncology

## 2016-01-10 DIAGNOSIS — C3491 Malignant neoplasm of unspecified part of right bronchus or lung: Secondary | ICD-10-CM

## 2016-01-10 DIAGNOSIS — R2681 Unsteadiness on feet: Secondary | ICD-10-CM | POA: Diagnosis present

## 2016-01-10 DIAGNOSIS — Z801 Family history of malignant neoplasm of trachea, bronchus and lung: Secondary | ICD-10-CM

## 2016-01-10 DIAGNOSIS — R29898 Other symptoms and signs involving the musculoskeletal system: Secondary | ICD-10-CM | POA: Diagnosis present

## 2016-01-10 DIAGNOSIS — R911 Solitary pulmonary nodule: Secondary | ICD-10-CM | POA: Diagnosis not present

## 2016-01-10 DIAGNOSIS — R918 Other nonspecific abnormal finding of lung field: Secondary | ICD-10-CM | POA: Diagnosis not present

## 2016-01-10 DIAGNOSIS — Z5111 Encounter for antineoplastic chemotherapy: Secondary | ICD-10-CM

## 2016-01-10 DIAGNOSIS — I10 Essential (primary) hypertension: Secondary | ICD-10-CM

## 2016-01-10 DIAGNOSIS — Z720 Tobacco use: Secondary | ICD-10-CM

## 2016-01-10 DIAGNOSIS — J449 Chronic obstructive pulmonary disease, unspecified: Secondary | ICD-10-CM | POA: Diagnosis not present

## 2016-01-10 DIAGNOSIS — Z8041 Family history of malignant neoplasm of ovary: Secondary | ICD-10-CM

## 2016-01-10 DIAGNOSIS — C342 Malignant neoplasm of middle lobe, bronchus or lung: Secondary | ICD-10-CM

## 2016-01-10 HISTORY — DX: Encounter for antineoplastic chemotherapy: Z51.11

## 2016-01-10 LAB — COMPREHENSIVE METABOLIC PANEL
ALT: 10 U/L (ref 0–55)
ANION GAP: 12 meq/L — AB (ref 3–11)
AST: 15 U/L (ref 5–34)
Albumin: 3.7 g/dL (ref 3.5–5.0)
Alkaline Phosphatase: 224 U/L — ABNORMAL HIGH (ref 40–150)
BILIRUBIN TOTAL: 0.33 mg/dL (ref 0.20–1.20)
BUN: 12.9 mg/dL (ref 7.0–26.0)
CALCIUM: 9.6 mg/dL (ref 8.4–10.4)
CHLORIDE: 108 meq/L (ref 98–109)
CO2: 26 meq/L (ref 22–29)
Creatinine: 0.8 mg/dL (ref 0.6–1.1)
Glucose: 113 mg/dl (ref 70–140)
Potassium: 4.5 mEq/L (ref 3.5–5.1)
Sodium: 146 mEq/L — ABNORMAL HIGH (ref 136–145)
Total Protein: 6.9 g/dL (ref 6.4–8.3)

## 2016-01-10 LAB — CBC WITH DIFFERENTIAL/PLATELET
BASO%: 0.5 % (ref 0.0–2.0)
BASOS ABS: 0 10*3/uL (ref 0.0–0.1)
EOS ABS: 0.1 10*3/uL (ref 0.0–0.5)
EOS%: 1.4 % (ref 0.0–7.0)
HEMATOCRIT: 44.4 % (ref 34.8–46.6)
HGB: 14.6 g/dL (ref 11.6–15.9)
LYMPH%: 37.3 % (ref 14.0–49.7)
MCH: 28.7 pg (ref 25.1–34.0)
MCHC: 32.9 g/dL (ref 31.5–36.0)
MCV: 87.2 fL (ref 79.5–101.0)
MONO#: 0.4 10*3/uL (ref 0.1–0.9)
MONO%: 7.4 % (ref 0.0–14.0)
NEUT#: 3.1 10*3/uL (ref 1.5–6.5)
NEUT%: 53.4 % (ref 38.4–76.8)
PLATELETS: 261 10*3/uL (ref 145–400)
RBC: 5.09 10*6/uL (ref 3.70–5.45)
RDW: 14.5 % (ref 11.2–14.5)
WBC: 5.8 10*3/uL (ref 3.9–10.3)
lymph#: 2.2 10*3/uL (ref 0.9–3.3)

## 2016-01-10 MED ORDER — PROCHLORPERAZINE MALEATE 10 MG PO TABS: 10.0000 mg | ORAL_TABLET | Freq: Four times a day (QID) | ORAL | 0 refills | Status: DC | PRN

## 2016-01-10 NOTE — Therapy (Signed)
[place], [place], [address] Phone: [phone]   Fax:  [phone]  Physical Therapy Evaluation  Patient Details  Name: Courtney Zuniga MRN: [id number] Date of Birth: 09/09/1950 Referring Provider: Curt Bears, MD  Encounter Date: 01/10/2016      PT End of Session - 01/10/16 1518    Visit Number 1   Number of Visits 1   PT Start Time 2355   PT Stop Time 1504   PT Time Calculation (min) 21 min   Activity Tolerance Patient tolerated treatment well   Behavior During Therapy [facility] for tasks assessed/performed      Past Medical History:  Diagnosis Date  . Anemia    as a young woman  . Arthritis    osteoartritis  . Asthma   . Brain tumor (benign) ([place]) 2005 [facility]   Benign  . Chronic headaches   . Chronic hip pain   . Chronic pain   . COPD (chronic obstructive pulmonary disease) ([place])   . Coronary artery disease   . Depression   . Encounter for antineoplastic chemotherapy 01/10/2016  . Hypertension   . NSTEMI (non-ST elevated myocardial infarction) ([place])   . On home O2    qhs  . Pneumonia   . Shortness of breath dyspnea     Past Surgical History:  Procedure Laterality Date  . CHOLECYSTECTOMY    . COLONOSCOPY  2015   Results requested from [facility]  . COLONOSCOPY    . ESOPHAGOGASTRODUODENOSCOPY N/A 08/14/2015   Procedure: ESOPHAGOGASTRODUODENOSCOPY (EGD);  Surgeon: Daneil Dolin, MD;  [facility];  Service: Endoscopy;  Laterality: N/A;  215   . ESOPHAGOGASTRODUODENOSCOPY (EGD) WITH PROPOFOL N/A 09/13/2015   Procedure: ESOPHAGOGASTRODUODENOSCOPY (EGD) WITH PROPOFOL;  Surgeon: Milus Banister, MD;  [facility];  Service: Endoscopy;  Laterality: N/A;  . TUMOR REMOVAL  2005   Benign  . UPPER ESOPHAGEAL ENDOSCOPIC ULTRASOUND (EUS)  09/13/2015   Procedure: UPPER ESOPHAGEAL ENDOSCOPIC ULTRASOUND (EUS);  Surgeon: Milus Banister, MD;  Location: [facility];  Service: Endoscopy;;  . VIDEO BRONCHOSCOPY WITH ENDOBRONCHIAL NAVIGATION N/A 12/31/2015   Procedure: VIDEO BRONCHOSCOPY WITH ENDOBRONCHIAL NAVIGATION;  Surgeon: Melrose Nakayama, MD;  Location: [place];  Service: Thoracic;  Laterality: N/A;  . VIDEO BRONCHOSCOPY WITH ENDOBRONCHIAL ULTRASOUND N/A 11/08/2015   Procedure: VIDEO BRONCHOSCOPY WITH ENDOBRONCHIAL ULTRASOUND;  Surgeon: Ivin Poot, MD;  Location: [facility] OR;  Service: Thoracic;  Laterality: N/A;    There were no vitals filed for this visit.       Subjective Assessment - 01/10/16 1507    Subjective Patient feels like she is not having any problems right now.   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 Current plan is chemoradiation, she is not a surgical candidate. Patient is a smoker with a history of COPD on home oxygen at night, OA, benign brain tumor in 2005, non-STEMI, DDD, and lumbar herniated disc.   Patient Stated Goals to obtain information from lung multidisciplinary clinic providers   Currently in Pain? Yes   Pain Score 6    Pain Location Head  headache   Pain Descriptors / Indicators Aching   Pain Onset Today            [facility] PT Assessment - 01/10/16 0001      Assessment   Medical Diagnosis  non-small cell carcinoma   Referring Provider Curt Bears, MD     Precautions   Precaution Comments active cancer     Restrictions   Weight Bearing Restrictions No     Balance Screen   Has the patient fallen in the past 6 months No   Has the patient had a decrease in activity level because of a fear of falling?  No   Is the patient reluctant to leave their home because of a fear of falling?  No     Home Ec[employer] residence   Living Arrangements Other (Comment)  nephew currently staying with her   Type  of [place] to enter   Entrance Stairs-Number of Steps 3     Prior Function   Level of Independence Independent   Leisure no regular exercise     Cognition   Overall Cognitive Status Within Functional Limits for tasks assessed     Functional Tests   Functional tests Sit to Stand     Sit to Stand   Comments able to perform 8 repetitions during 30 second sit to stand, below average for age; also, movements were jerky and fast and patient reported SOB after     Posture/Postural Control   Posture/Postural Control No significant limitations     ROM / Strength   AROM / PROM / Strength AROM     AROM   Overall AROM Comments trunk AROM WFL for forward flexion, extension, bilateral rotation, and bilateral sidebending     Balance   Balance Assessed Yes     Dynamic Standing Balance   Dynamic Standing - Comments able to reach 6 inches during standing Functional Reach test, below average for age                           PT Education - 01/10/16 1517    Education provided Yes   Education Details energy conservation, walking program, Cure article, posture, deep breathing, PT information   Person(s) Educated Patient;Child(ren)   Methods Explanation;Demonstration;Handout   Comprehension Verbalized understanding;Returned demonstration               Lung Clinic Goals - 01/10/16 1525      Patient will be able to verbalize understanding of the benefit of exercise to decrease fatigue.   Status Achieved     Patient will be able to verbalize the importance of posture.   Status Achieved     Patient will be able to demonstrate diaphragmatic breathing for improved lung function.   Status Achieved     Patient will be able to verbalize understanding of the role of physical therapy to prevent functional decline and who to contact if physical therapy is needed.   Status Achieved             Plan - 01/10/16 1518    Clinical  Impression Statement Patient is a 65 year old female diagnosed with stage III non-small cell carcinoma with current treatment plan to do chemoradiation. Patient has a history of benign brain tumor that was removed via laser (per patient) in 2005. Patient demonstrates good standing trunk AROM. She has decresaed standing balance upon standing Functional Reach and completed fewer than average repetitions during 30 second sit to stand. She moves with fast, jerky movements. She may benefit from some physical therapy after begining her cancer treatments.    PT Frequency One time visit  PT Treatment/Interventions ADLs/Self Care Home Management;Patient/family education   PT Next Visit Plan may benefit from PT for decreased balance and endurance at a later time   Consulted and Agree with Plan of Care Patient      Patient will benefit from skilled therapeutic intervention in order to improve the following deficits and impairments:  Decreased balance, Decreased endurance  Visit Diagnosis: Unsteadiness on feet  Other symptoms and signs involving the musculoskeletal system     Problem List Patient Active Problem List   Diagnosis Date Noted  . Adenocarcinoma of right lung, stage 3 ([place]) 01/10/2016  . Encounter for antineoplastic chemotherapy 01/10/2016  . Lung mass 01/03/2016  . Arthritis   . Gastric nodule   . Mucosal abnormality of stomach   . Abdominal pain 08/08/2015  . Abnormal CT of the abdomen 08/08/2015  . Influenza with pneumonia 06/27/2014  . Hypoxia 06/26/2014  . COPD exacerbation ([place]) 06/26/2014  . CAP (community acquired pneumonia)   . Chest pain, rule out acute myocardial infarction   . Asthma, chronic   . Chest pain 02/16/2014  . HTN (hypertension) 02/16/2014  . DDD (degenerative disc disease), lumbar 02/04/2011  . Lumbar herniated disc 01/09/2011    Mellody Life 01/10/2016, 3:26 PM  [place], [place], [address] Phone: [phone]   Fax:  [phone]  Name: Courtney Zuniga MRN: [id number] Date of Birth: [birth year]  Saverio Danker, SPT  This entire session was guided, instructed, and directly supervised by Serafina Royals, PT.  Read, reviewed, edited and agree with student's findings and recommendations.   Serafina Royals, PT 01/10/16 4:13 PM

## 2016-01-10 NOTE — Progress Notes (Signed)
[place] [phone]   [phone] Multidisciplinary thoracic oncology clinic  CONSULT NOTE  REFERRING PHYSICIAN: Dr. Modesto Charon  REASON FOR CONSULTATION:  65 years old African-American female recently diagnosed with lung cancer.  HPI Kamesha Mangham is a 65 y.o. female with long history of smoking and past medical history significant for COPD, hypertension, coronary artery disease, myocardial infarction, anemia, degenerative disc disease as well as recurrent pneumonia and anemia. The patient was seen by her primary care physician for routine evaluation in June 2017 and he ordered low-dose CT scanning of the chest which was performed on 09/25/2015 and it showed a 2.2 cm nodule within the right middle lobe suspicious for malignancy. She was referred to Dr. Prescott Gum. A PET scan on 10/23/2015 showed the nodule of concern on the recent lung cancer screening examination is hypermetabolic (SUVmax = 87.5) in the central aspect of the right middle lobe and measures approximately 2.7 x 1.9 cm. There is a 6 mm hypermetabolic (SUVmax =6.4) pulmonary nodule in the left upper lobe, which for such a small nodule is considered significant. This nodule is similar in retrospect to prior CT scan 09/25/2015. Low level hypermetabolic activity is noted in the right hilar (SUVmax = 3.7) and subcarinal (SUVmax = 3.5) nodal stations, suspicious for metastatic lymphadenopathy, although the lymph nodes in these regions do not appear enlarged on today's noncontrast CT examination. In addition, there are new ground-glass attenuation nodules in the right lower lobe which demonstrate low-level hypermetabolic activity. This is favored to be infectious/inflammatory in etiology given the rapid development compared to the prior study, but could less likely reflect changes from aerogenous metastasis. No evidence of metastatic disease in the abdomen or pelvis. The patient underwent a video  bronchoscopy with endobronchial ultrasound guided biopsy of the mediastinal lymph nodes under the care of Dr. Prescott Gum on 11/08/2015 but was not conclusive for malignancy. She was then referred to Dr. Roxan Hockey and underwent electromagnetic navigational bronchoscopy with brushings and biopsies on 12/31/2015. The final pathology (Accession: [id number]) of the right middle lobe nodule was consistent with non-small cell carcinoma. There is a small focus with malignant appearing cells. These cells are positive for TTF-1 and negative for NapsinA, p63, chromogranin, and CD56. The morphology and immunohistochemistry favor an adenocarcinoma. There is likely insufficient tumor for molecular studies. Dr. Roxan Hockey kindly referred the patient to the multidisciplinary thoracic oncology clinic today for further evaluation and recommendation regarding treatment of her condition. When seen today she denied having any specific complaints except for tickling in her throat and dry cough. She denied having any significant chest pain, shortness breath or hemoptysis. She denied having any significant weight loss or night sweats. She has no nausea, vomiting, diarrhea or constipation. She has intermittent headache but no visual changes. Family history significant for mother with ovarian cancer, father and brother had lung cancer. The patient is single and has 2 children. She lives in [place]. She was accompanied today by her daughter [name]. She used to work in [employer]. She has a history of smoking less than one pack per day for around 40 years and unfortunately she continues to smoke. She has no history of alcohol or drug abuse.   HPI  Past Medical History:  Diagnosis Date  . Anemia    as a young woman  . Arthritis    osteoartritis  . Asthma   . Brain tumor (benign) ([place]) 2005 [facility]   Benign  . Chronic headaches   .  Chronic hip pain   . Chronic pain   . COPD (chronic obstructive  pulmonary disease) ([place])   . Coronary artery disease   . Depression   . Encounter for antineoplastic chemotherapy 01/10/2016  . Hypertension   . NSTEMI (non-ST elevated myocardial infarction) ([place])   . On home O2    qhs  . Pneumonia   . Shortness of breath dyspnea     Past Surgical History:  Procedure Laterality Date  . CHOLECYSTECTOMY    . COLONOSCOPY  2015   [facility]  . COLONOSCOPY    . ESOPHAGOGASTRODUODENOSCOPY N/A 08/14/2015   Procedure: ESOPHAGOGASTRODUODENOSCOPY (EGD);  Surgeon: Daneil Dolin, MD;  [facility];  Service: Endoscopy;  Laterality: N/A;  215   . ESOPHAGOGASTRODUODENOSCOPY (EGD) WITH PROPOFOL N/A 09/13/2015   Procedure: ESOPHAGOGASTRODUODENOSCOPY (EGD) WITH PROPOFOL;  Surgeon: Milus Banister, MD;  [facility];  Service: Endoscopy;  Laterality: N/A;  . TUMOR REMOVAL  2005   Benign  . UPPER ESOPHAGEAL ENDOSCOPIC ULTRASOUND (EUS)  09/13/2015   Procedure: UPPER ESOPHAGEAL ENDOSCOPIC ULTRASOUND (EUS);  Surgeon: Milus Banister, MD;  Location[facility];  Service: Endoscopy;;  . VIDEO BRONCHOSCOPY WITH ENDOBRONCHIAL NAVIGATION N/A 12/31/2015   Procedure: VIDEO BRONCHOSCOPY WITH ENDOBRONCHIAL NAVIGATION;  Surgeon: Melrose Nakayama, MD;  Location: [place];  Service: Thoracic;  Laterality: N/A;  . VIDEO BRONCHOSCOPY WITH ENDOBRONCHIAL ULTRASOUND N/A 11/08/2015   Procedure: VIDEO BRONCHOSCOPY WITH ENDOBRONCHIAL ULTRASOUND;  Surgeon: Ivin Poot, MD;  Location: [facility] OR;  Service: Thoracic;  Laterality: N/A;    Family History  Problem Relation Age of Onset  . Ovarian cancer Mother   . Lung cancer Father   . Lung cancer Brother   . Lung cancer Brother   . Prostate cancer Brother   . Brain cancer Sister     Half sister    Social History Social History  Substance Use Topics  . Smoking status: Current Every Day Smoker    Packs/day: 0.25    Types: Cigarettes  . Smokeless tobacco: Never Used     Comment: Pt  has asked doctor for med to help   . Alcohol use No    Allergies  Allergen Reactions  . No Known Allergies     Current Outpatient Prescriptions  Medication Sig Dispense Refill  . albuterol (PROVENTIL HFA;VENTOLIN HFA) 108 (90 BASE) MCG/ACT inhaler Inhale 2 puffs into the lungs every 6 (six) hours as needed for shortness of breath.     Marland Kitchen albuterol (PROVENTIL) (2.5 MG/3ML) 0.083% nebulizer solution Take 2.5 mg by nebulization every 6 (six) hours as needed for wheezing or shortness of breath.    . Aspirin-Salicylamide-Caffeine (BC HEADACHE POWDER PO) Take 1 packet by mouth daily as needed (headache).    . calcium-vitamin D 250-100 MG-UNIT per tablet Take 1 tablet by mouth daily.    . Cholecalciferol (VITAMIN D3) 5000 units TABS Take 1 tablet by mouth daily.    . Fluticasone-Salmeterol (ADVAIR) 500-50 MCG/DOSE AEPB Inhale 1 puff into the lungs every 12 (twelve) hours.    Marland Kitchen HYDROcodone-acetaminophen (NORCO/VICODIN) 5-325 MG tablet Take 1 tablet by mouth every 6 (six) hours as needed. (Patient taking differently: Take 1 tablet by mouth every 6 (six) hours as needed (For pain.). ) 20 tablet 0  . lisinopril (PRINIVIL,ZESTRIL) 40 MG tablet Take 40 mg by mouth daily.    . nitroGLYCERIN (NITROSTAT) 0.4 MG SL tablet Place 0.4 mg under the tongue every 5 (five) minutes as needed for  chest pain.    . pantoprazole (PROTONIX) 40 MG tablet Take 40 mg by mouth daily.    . prochlorperazine (COMPAZINE) 10 MG tablet Take 1 tablet (10 mg total) by mouth every 6 (six) hours as needed for nausea or vomiting. 30 tablet 0  . QVAR 80 MCG/ACT inhaler Inhale 1 puff into the lungs 2 (two) times daily as needed.     . Tiotropium Bromide-Olodaterol (STIOLTO RESPIMAT) 2.5-2.5 MCG/ACT AERS Inhale 1 puff into the lungs daily.     No current facility-administered medications for this visit.     Review of Systems  Constitutional: positive for fatigue Eyes: negative Ears, nose, mouth, throat, and face:  negative Respiratory: positive for cough and dyspnea on exertion Cardiovascular: negative Gastrointestinal: negative Genitourinary:negative Integument/breast: negative Hematologic/lymphatic: negative Musculoskeletal:negative Neurological: positive for headaches Behavioral/Psych: negative Endocrine: negative Allergic/Immunologic: negative  Physical Exam  JME:QASTM, healthy, no distress, well nourished and well developed SKIN: skin color, texture, turgor are normal, no rashes or significant lesions HEAD: Normocephalic, No masses, lesions, tenderness or abnormalities EYES: normal, PERRLA, Conjunctiva are pink and non-injected EARS: External ears normal, Canals clear OROPHARYNX:no exudate, no erythema and lips, buccal mucosa, and tongue normal  NECK: supple, no adenopathy, no JVD LYMPH:  no palpable lymphadenopathy, no hepatosplenomegaly BREAST:not examined LUNGS: clear to auscultation , and palpation HEART: regular rate & rhythm, no murmurs and no gallops ABDOMEN:abdomen soft, non-tender, normal bowel sounds and no masses or organomegaly BACK: Back symmetric, no curvature., No CVA tenderness EXTREMITIES:no joint deformities, effusion, or inflammation, no edema, no skin discoloration  NEURO: alert & oriented x 3 with fluent speech, no focal motor/sensory deficits  PERFORMANCE STATUS: ECOG 1  LABORATORY DATA: Lab Results  Component Value Date   WBC 5.8 01/10/2016   HGB 14.6 01/10/2016   HCT 44.4 01/10/2016   MCV 87.2 01/10/2016   PLT 261 01/10/2016      Chemistry      Component Value Date/Time   NA 146 (H) 01/10/2016 1200   K 4.5 01/10/2016 1200   CL 110 12/31/2015 0941   CO2 26 01/10/2016 1200   BUN 12.9 01/10/2016 1200   CREATININE 0.8 01/10/2016 1200      Component Value Date/Time   CALCIUM 9.6 01/10/2016 1200   ALKPHOS 224 (H) 01/10/2016 1200   AST 15 01/10/2016 1200   ALT 10 01/10/2016 1200   BILITOT 0.33 01/10/2016 1200       RADIOGRAPHIC STUDIES: Dg  Chest 2 View  Result Date: 12/31/2015 CLINICAL DATA:  Preop for bronchoscopy.  Right lung nodule. EXAM: CHEST  2 VIEW COMPARISON:  CT chest 12/07/2015 FINDINGS: The heart size is normal. Right infrahilar lung nodule in retraction is again noted. Emphysematous changes are present. Posterior bilateral lower lobe scarring and atelectasis is present. Postobstructive collapse in the right middle lobe is again seen. IMPRESSION: 1. Right infrahilar lung nodule and postobstructive partial collapse of the right middle lobe. 2. Emphysema. 3. Bibasilar atelectasis or scarring. 4. No acute cardiopulmonary disease. Electronically Signed   By: San Morelle M.D.   On: 12/31/2015 09:46   Dg C-arm Bronchoscopy  Result Date: 12/31/2015 CLINICAL DATA:  C-ARM BRONCHOSCOPY Fluoroscopy was utilized by the requesting physician.  No radiographic interpretation.    ASSESSMENT: This is a very pleasant 65 years old African-American female recently diagnosed with stage IIIA (T1b, N2, M0) non-small cell lung cancer, adenocarcinoma presented with right middle lobe lung nodule in addition to mediastinal lymphadenopathy. The patient also has a highly suspicious small nodule in the  left upper lobe that could change her disease to a stage IV or at least another synchronous primary lesion in the left upper lobe.   PLAN: I had a lengthy discussion with the patient and her daughter about her current disease stage, prognosis and treatment options. I recommended for the patient to complete the staging workup by ordering a MRI of the brain to rule out brain metastasis. I also discussed with the patient treatment for her condition. I recommended for the patient a course of concurrent chemoradiation with weekly carboplatin for AUC of 2 and paclitaxel 45 MG/M2. I discussed with the patient adverse effect of the chemotherapy including but not limited to alopecia, myelosuppression, nausea and vomiting, peripheral neuropathy, liver or  renal dysfunction. I will arrange for the patient to have a chemotherapy education class before starting the first dose of his chemotherapy. She is expected to start the first dose of this treatment on 01/21/2016. I will call her pharmacy with prescription for Compazine 10 mg by mouth every 6 hours as needed for nausea. The patient will be seen later today by Dr. Sondra Come for evaluation and discussion of the radiotherapy option. She was seen during the multidisciplinary thoracic oncology clinic today by medical oncology, radiation oncology, thoracic navigator, social worker and physical therapist. The patient would come back for follow-up visit in 3 weeks for evaluation and management of any adverse effect of her treatment. For smoke cessation I strongly advised the patient to quit smoking and offered her smoke cessation program and plan to quit smoking. She was advised to call immediately if she has any concerning symptoms in the interval. The patient voices understanding of current disease status and treatment options and is in agreement with the current care plan.  All questions were answered. The patient knows to call the clinic with any problems, questions or concerns. We can certainly see the patient much sooner if necessary.  Thank you so much for allowing me to participate in the care of [facility]. I will continue to follow up the patient with you and assist in her care.  I spent 55 minutes counseling the patient face to face. The total time spent in the appointment was 80 minutes.  Disclaimer: This note was dictated with voice recognition software. Similar sounding words can inadvertently be transcribed and may not be corrected upon review.   Derryl Uher K. January 10, 2016, 2:15 PM

## 2016-01-10 NOTE — Telephone Encounter (Signed)
Per LOS I have scheduled appts and notified the scheduler. No available on 10/9, moved to 10/10 and notified the scheduler

## 2016-01-10 NOTE — Patient Instructions (Signed)
Smoking Cessation, Tips for Success If you are ready to quit smoking, congratulations! You have chosen to help yourself be healthier. Cigarettes bring nicotine, tar, carbon monoxide, and other irritants into your body. Your lungs, heart, and blood vessels will be able to work better without these poisons. There are many different ways to quit smoking. Nicotine gum, nicotine patches, a nicotine inhaler, or nicotine nasal spray can help with physical craving. Hypnosis, support groups, and medicines help break the habit of smoking. WHAT THINGS CAN I DO TO MAKE QUITTING EASIER?  Here are some tips to help you quit for good:  Pick a date when you will quit smoking completely. Tell all of your friends and family about your plan to quit on that date.  Do not try to slowly cut down on the number of cigarettes you are smoking. Pick a quit date and quit smoking completely starting on that day.  Throw away all cigarettes.   Clean and remove all ashtrays from your home, work, and car.  On a card, write down your reasons for quitting. Carry the card with you and read it when you get the urge to smoke.  Cleanse your body of nicotine. Drink enough water and fluids to keep your urine clear or pale yellow. Do this after quitting to flush the nicotine from your body.  Learn to predict your moods. Do not let a bad situation be your excuse to have a cigarette. Some situations in your life might tempt you into wanting a cigarette.  Never have "just one" cigarette. It leads to wanting another and another. Remind yourself of your decision to quit.  Change habits associated with smoking. If you smoked while driving or when feeling stressed, try other activities to replace smoking. Stand up when drinking your coffee. Brush your teeth after eating. Sit in a different chair when you read the paper. Avoid alcohol while trying to quit, and try to drink fewer caffeinated beverages. Alcohol and caffeine may urge you to  smoke.  Avoid foods and drinks that can trigger a desire to smoke, such as sugary or spicy foods and alcohol.  Ask people who smoke not to smoke around you.  Have something planned to do right after eating or having a cup of coffee. For example, plan to take a walk or exercise.  Try a relaxation exercise to calm you down and decrease your stress. Remember, you may be tense and nervous for the first 2 weeks after you quit, but this will pass.  Find new activities to keep your hands busy. Play with a pen, coin, or rubber band. Doodle or draw things on paper.  Brush your teeth right after eating. This will help cut down on the craving for the taste of tobacco after meals. You can also try mouthwash.   Use oral substitutes in place of cigarettes. Try using lemon drops, carrots, cinnamon sticks, or chewing gum. Keep them handy so they are available when you have the urge to smoke.  When you have the urge to smoke, try deep breathing.  Designate your home as a nonsmoking area.  If you are a heavy smoker, ask your health care provider about a prescription for nicotine chewing gum. It can ease your withdrawal from nicotine.  Reward yourself. Set aside the cigarette money you save and buy yourself something nice.  Look for support from others. Join a support group or smoking cessation program. Ask someone at home or at work to help you with your plan   to quit smoking.  Always ask yourself, "Do I need this cigarette or is this just a reflex?" Tell yourself, "Today, I choose not to smoke," or "I do not want to smoke." You are reminding yourself of your decision to quit.  Do not replace cigarette smoking with electronic cigarettes (commonly called e-cigarettes). The safety of e-cigarettes is unknown, and some may contain harmful chemicals.  If you relapse, do not give up! Plan ahead and think about what you will do the next time you get the urge to smoke. HOW WILL I FEEL WHEN I QUIT SMOKING? You  may have symptoms of withdrawal because your body is used to nicotine (the addictive substance in cigarettes). You may crave cigarettes, be irritable, feel very hungry, cough often, get headaches, or have difficulty concentrating. The withdrawal symptoms are only temporary. They are strongest when you first quit but will go away within 10-14 days. When withdrawal symptoms occur, stay in control. Think about your reasons for quitting. Remind yourself that these are signs that your body is healing and getting used to being without cigarettes. Remember that withdrawal symptoms are easier to treat than the major diseases that smoking can cause.  Even after the withdrawal is over, expect periodic urges to smoke. However, these cravings are generally short lived and will go away whether you smoke or not. Do not smoke! WHAT RESOURCES ARE AVAILABLE TO HELP ME QUIT SMOKING? Your health care provider can direct you to community resources or hospitals for support, which may include:  Group support.  Education.  Hypnosis.  Therapy.   This information is not intended to replace advice given to you by your health care provider. Make sure you discuss any questions you have with your health care provider.   Document Released: 12/28/2003 Document Revised: 04/21/2014 Document Reviewed: 09/16/2012 Elsevier Interactive Patient Education 2016 Elsevier Inc.  

## 2016-01-10 NOTE — Progress Notes (Signed)
Radiation Oncology         [phone] ________________________________  Initial Outpatient Consultation  Name: Courtney Zuniga MRN: [id number]  Date: 01/10/2016  DOB: Oct 26, 1950  AO:ZHYQ Markus Jarvis, NP  Melrose Nakayama, *   REFERRING PHYSICIAN: Melrose Nakayama, *  DIAGNOSIS: The encounter diagnosis was Adenocarcinoma of right lung, stage 3 ([place]).   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 female who had a LDCT screening for lung cancer on 09/25/15. This showed a 2.2 cm RML nodule and emphysema. CT scan on 09/04/15 for headaches was negative for metastasis.  The patient presented to Dr. Prescott Gum on 10/17/15 who discussed obtaining a PET scan and PFTs.  PET scan on 10/23/15 showed a 2.7 x 1.9 cm RML nodule with SUV max 26.2 associated with nonenlarged but hypermetabolic right hilar and subcarinal lymph nodes. There is also a 0.6 cm LUL hypermetabolic nodule and new ground-glass nodules in the RLL with low-level hypermetabolic activity that are favored to be infectious/inflammatory in etiology but less likely metastasis.  On 11/08/15, the patient underwent a bronchoscopy and endobronchial ultrasound. RML brushing revealed no malignant cells, fine needle aspiration of a 4R lymph node revealed no malignant cells, and bronchial washing of the RML revealed no malignant cells. Radiology did not feel that this was approachable with a CT-guided needle biopsy. Therefore she was referred for consideration for navigational bronchoscopy by Dr. Roxan Hockey  Navigational bronchoscopy by Dr. Roxan Hockey was performed on 12/31/15. Biopsy of the RML revealed adenocarcinoma ([place]), Wang Needle of the RML revealed scant atypical cells, transbronchial needle aspiration of the RML revealed malignant cells consistent with NSCC, and brushing of the RML revealed malignant cells consistent with NSCC.  The patient and her  daughter present today in multidisciplinary thoracic clinic to discuss treatment options for the management of her disease.   PREVIOUS RADIATION THERAPY: No  PAST MEDICAL HISTORY:  has a past medical history of Anemia; Arthritis; Asthma; Brain tumor (benign) ([place]) [facility]); Chronic headaches; Chronic hip pain; Chronic pain; COPD (chronic obstructive pulmonary disease) ([place]); Coronary artery disease; Depression; Encounter for antineoplastic chemotherapy (01/10/2016); Hypertension; NSTEMI (non-ST elevated myocardial infarction) ([facility]); On home O2; Pneumonia; and Shortness of breath dyspnea.    PAST SURGICAL HISTORY: Past Surgical History:  Procedure Laterality Date  . CHOLECYSTECTOMY    . COLONOSCOPY  2015   Results requested from [facility]  . COLONOSCOPY    . ESOPHAGOGASTRODUODENOSCOPY N/A 08/14/2015   Procedure: ESOPHAGOGASTRODUODENOSCOPY (EGD);  Surgeon: Daneil Dolin, MD;  [facility];  Service: Endoscopy;  Laterality: N/A;  215   . ESOPHAGOGASTRODUODENOSCOPY (EGD) WITH PROPOFOL N/A 09/13/2015   Procedure: ESOPHAGOGASTRODUODENOSCOPY (EGD) WITH PROPOFOL;  Surgeon: Milus Banister, MD;  Location: [facility];  Service: Endoscopy;  Laterality: N/A;  . TUMOR REMOVAL  2005   Benign  . UPPER ESOPHAGEAL ENDOSCOPIC ULTRASOUND (EUS)  09/13/2015   Procedure: UPPER ESOPHAGEAL ENDOSCOPIC ULTRASOUND (EUS);  Surgeon: Milus Banister, MD;  Location: [facility];  Service: Endoscopy;;  . VIDEO BRONCHOSCOPY WITH ENDOBRONCHIAL NAVIGATION N/A 12/31/2015   Procedure: VIDEO BRONCHOSCOPY WITH ENDOBRONCHIAL NAVIGATION;  Surgeon: Melrose Nakayama, MD;  Location: [facility];  Service: Thoracic;  Laterality: N/A;  . VIDEO BRONCHOSCOPY WITH ENDOBRONCHIAL ULTRASOUND N/A 11/08/2015   Procedure: VIDEO BRONCHOSCOPY WITH ENDOBRONCHIAL ULTRASOUND;  Surgeon: Ivin Poot, MD;  Location: [facility];  Service: Thoracic;  Laterality: N/A;    FAMILY HISTORY:  family history includes Brain cancer in her  sister; Lung cancer in her brother, brother, and father; Ovarian cancer in her mother; Prostate cancer in her brother.  SOCIAL HISTORY:  reports that she has been smoking Cigarettes.  She has been smoking about 0.25 packs per day. She has never used smokeless tobacco. She reports that she does not drink alcohol or use drugs.  ALLERGIES: No known allergies  MEDICATIONS:  Current Outpatient Prescriptions  Medication Sig Dispense Refill  . albuterol (PROVENTIL HFA;VENTOLIN HFA) 108 (90 BASE) MCG/ACT inhaler Inhale 2 puffs into the lungs every 6 (six) hours as needed for shortness of breath.     Marland Kitchen albuterol (PROVENTIL) (2.5 MG/3ML) 0.083% nebulizer solution Take 2.5 mg by nebulization every 6 (six) hours as needed for wheezing or shortness of breath.    . Aspirin-Salicylamide-Caffeine (BC HEADACHE POWDER PO) Take 1 packet by mouth daily as needed (headache).    . calcium-vitamin D 250-100 MG-UNIT per tablet Take 1 tablet by mouth daily.    . Cholecalciferol (VITAMIN D3) 5000 units TABS Take 1 tablet by mouth daily.    . Fluticasone-Salmeterol (ADVAIR) 500-50 MCG/DOSE AEPB Inhale 1 puff into the lungs every 12 (twelve) hours.    Marland Kitchen HYDROcodone-acetaminophen (NORCO/VICODIN) 5-325 MG tablet Take 1 tablet by mouth every 6 (six) hours as needed. (Patient taking differently: Take 1 tablet by mouth every 6 (six) hours as needed (For pain.). ) 20 tablet 0  . lisinopril (PRINIVIL,ZESTRIL) 40 MG tablet Take 40 mg by mouth daily.    . nitroGLYCERIN (NITROSTAT) 0.4 MG SL tablet Place 0.4 mg under the tongue every 5 (five) minutes as needed for chest pain.    . pantoprazole (PROTONIX) 40 MG tablet Take 40 mg by mouth daily.    . prochlorperazine (COMPAZINE) 10 MG tablet Take 1 tablet (10 mg total) by mouth every 6 (six) hours as needed for nausea or vomiting. 30 tablet 0  . QVAR 80 MCG/ACT inhaler Inhale 1 puff into the lungs 2 (two) times daily as needed.     . Tiotropium Bromide-Olodaterol (STIOLTO RESPIMAT)  2.5-2.5 MCG/ACT AERS Inhale 1 puff into the lungs daily.     No current facility-administered medications for this encounter.     REVIEW OF SYSTEMS:  A 15 point review of systems is documented in the electronic medical record. This was obtained by the nursing staff. However, I reviewed this with the patient to discuss relevant findings and make appropriate changes.  Pertinent items are noted in HPI.   Denies hemoptysis. Reports shortness of breath.   PHYSICAL EXAM:  Vitals with BMI 01/10/2016  Height '5\' 7"'$   Weight 138 lbs 3 oz  BMI 01.7  Systolic 793  Diastolic 64  Pulse 84  Respirations 18  General: Alert and oriented, in no acute distress HEENT: Head is normocephalic. Extraocular movements are intact. Lot of twitching in her left eye related to her prior brain surgery. Oropharynx is clear. Dentures on top. Neck: Neck is supple, no palpable cervical or supraclavicular lymphadenopathy. Heart: Regular in rate and rhythm with no murmurs, rubs, or gallops. Chest: Clear to auscultation bilaterally, with no rhonchi, wheezes, or rales. Abdomen: Soft, nontender, nondistended, with no rigidity or guarding. Extremities: No cyanosis or edema. Lymphatics: see Neck Exam Skin: No concerning lesions. Musculoskeletal: symmetric strength and muscle tone throughout. Neurologic: Cranial nerves II through XII are grossly intact. No obvious focalities. Speech is fluent. Coordination is intact. Psychiatric: Judgment and insight are intact. Affect is appropriate.  ECOG = 1  LABORATORY DATA:  Lab Results  Component Value Date   WBC 5.8 01/10/2016   HGB 14.6 01/10/2016   HCT 44.4 01/10/2016   MCV 87.2 01/10/2016   PLT 261 01/10/2016   NEUTROABS 3.1 01/10/2016   Lab Results  Component Value Date   NA 146 (H) 01/10/2016   K 4.5 01/10/2016   CL 110 12/31/2015   CO2 26 01/10/2016   GLUCOSE 113 01/10/2016   CREATININE 0.8 01/10/2016   CALCIUM 9.6 01/10/2016      RADIOGRAPHY: Dg Chest 2  View  Result Date: 12/31/2015 CLINICAL DATA:  Preop for bronchoscopy.  Right lung nodule. EXAM: CHEST  2 VIEW COMPARISON:  CT chest 12/07/2015 FINDINGS: The heart size is normal. Right infrahilar lung nodule in retraction is again noted. Emphysematous changes are present. Posterior bilateral lower lobe scarring and atelectasis is present. Postobstructive collapse in the right middle lobe is again seen. IMPRESSION: 1. Right infrahilar lung nodule and postobstructive partial collapse of the right middle lobe. 2. Emphysema. 3. Bibasilar atelectasis or scarring. 4. No acute cardiopulmonary disease. Electronically Signed   By: San Morelle M.D.   On: 12/31/2015 09:46   Dg C-arm Bronchoscopy  Result Date: 12/31/2015 CLINICAL DATA:  C-ARM BRONCHOSCOPY Fluoroscopy was utilized by the requesting physician.  No radiographic interpretation.      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   Today, I talked to the patient and family about the findings and work-up thus far.  We discussed the natural history of stage IIIA adenocarcinoma of the right lung and general treatment, highlighting the role of radiotherapy in the management.  We discussed the available radiation techniques, and focused on the details of logistics and delivery.  We reviewed the anticipated acute and late sequelae associated with radiation in this setting.  The patient was encouraged to ask questions that I answered to the best of my ability.  PLAN: The patient signed a consent form and this was placed in her medical chart. CT simulation is scheduled on 01/15/16 at 10AM. She is being scheduled for an MRI of the brain to complete staging workup.     ------------------------------------------------  Blair Promise, PhD, MD  This document serves as a record of services personally performed  by Gery Pray, MD. It was created on his behalf by Darcus Austin, a trained medical scribe. The creation of this record is based on the scribe's personal observations and the provider's statements to them. This document has been checked and approved by the attending provider.

## 2016-01-10 NOTE — Progress Notes (Signed)
START ON PATHWAY REGIMEN - Non-Small Cell Lung  [id number]: Carboplatin AUC=2 + Paclitaxel 45 mg/m2 Weekly During Radiation   Administer weekly:     Paclitaxel (Taxol(R)) 45 mg/m2 in 250 mL NS IV over 1 hour followed by Dose Mod: None     Carboplatin (Paraplatin(R)) AUC=2 in 100 mL NS IV over 30 minutes Dose Mod: None Additional Orders: * All AUC calculations intended to be used in [employer] formula  **Always confirm dose/schedule in your pharmacy ordering system**    Patient Characteristics: Stage III - Unresectable, PS = 0, 1 AJCC M Stage: 0 AJCC N Stage: 2 AJCC T Stage: 1b Current Disease Status: No Distant Metastases or Local Recurrence AJCC Stage Grouping: IIIA Performance Status: PS = 0, 1  Intent of Therapy: Curative Intent, Discussed with Patient

## 2016-01-10 NOTE — Progress Notes (Signed)
Courtney Zuniga Clinical Social Work  Clinical Social Work met with patient/family at [employer] appointment to offer support and assess for psychosocial needs.  Courtney Zuniga was accompanied by her daughter, Courtney Zuniga.  The patient shared she enjoys watching TV and goes to [place] daily with her sister.  Courtney Zuniga lives in [place] and has a large support system of family and friends.  CSW encouraged patient to follow up with CSW regarding gas card program.  Clinical Social Work briefly discussed [place] Work role and [employer] support programs/services.  Clinical Social Work encouraged patient to call with any additional questions or concerns.   Courtney Zuniga, MSW, LCSW, OSW-C Clinical Social Worker [facility] [phone]

## 2016-01-10 NOTE — Telephone Encounter (Signed)
Message sent to chemo scheduler to add chemo. Avs report and appointment schedule given to patient per 01/10/16 los.

## 2016-01-15 ENCOUNTER — Ambulatory Visit
Admission: RE | Admit: 2016-01-15 | Discharge: 2016-01-15 | Disposition: A | Discharge status: Home / Self Care | Payer: Medicare HMO | Source: Ambulatory Visit | Attending: Radiation Oncology | Admitting: Radiation Oncology

## 2016-01-15 DIAGNOSIS — C3491 Malignant neoplasm of unspecified part of right bronchus or lung: Secondary | ICD-10-CM | POA: Diagnosis not present

## 2016-01-15 DIAGNOSIS — Z510 Encounter for antineoplastic radiation therapy: Secondary | ICD-10-CM | POA: Insufficient documentation

## 2016-01-15 NOTE — Progress Notes (Signed)
  Radiation Oncology         [phone] ________________________________  Name: Courtney Zuniga MRN: [id number]  Date: 01/15/2016  DOB: [birth year]  SIMULATION AND TREATMENT PLANNING NOTE   DIAGNOSIS:  Adenocarcinoma of right lung, stage 3 ([place]).   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  Identity was confirmed.  All relevant records and images related to the planned course of therapy were reviewed.  The patient freely provided informed written consent to proceed with treatment after reviewing the details related to the planned course of therapy. The consent form was witnessed and verified by the simulation staff.  Then, the patient was set-up in a stable reproducible  supine position for radiation therapy.  CT images were obtained.  Surface markings were placed.  The CT images were loaded into the planning software.  Then the target and avoidance structures were contoured.  Treatment planning then occurred.  The radiation prescription was entered and confirmed.  Then, I designed and supervised the construction of a total of 6 medically necessary complex treatment devices.  I have requested : 3D Simulation  I have requested a DVH of the following structures: heart, lungs, spinal cord, eosphagus, PTV, GTV.  I have ordered:dose calc.  PLAN:  The patient will receive 60 Gy in 30 fractions along with radiosensitizing chemotherapy  -----------------------------------   Special Treatment Procedure Note: The patient will be receiving radiosensitizing chemotherapy. Given the potential of increased toxicities related to combined therapy and the necessity for close monitoring of the patient and blood work, this constitutes a special treatment procedure.    Blair Promise, PhD, MD  This document serves as a record of services personally performed by Gery Pray, MD. It was created on his  behalf by Truddie Hidden, a trained medical scribe. The creation of this record is based on the scribe's personal observations and the provider's statements to them. This document has been checked and approved by the attending provider.

## 2016-01-17 ENCOUNTER — Encounter: Payer: Self-pay | Admitting: *Deleted

## 2016-01-17 ENCOUNTER — Encounter: Payer: Self-pay | Admitting: Internal Medicine

## 2016-01-17 ENCOUNTER — Other Ambulatory Visit: Payer: Medicare HMO

## 2016-01-17 DIAGNOSIS — Z510 Encounter for antineoplastic radiation therapy: Secondary | ICD-10-CM | POA: Diagnosis not present

## 2016-01-17 NOTE — Progress Notes (Signed)
Oncology Nurse Navigator Documentation  Oncology Nurse Navigator Flowsheets 01/17/2016  Navigator Encounter Type Other/Ms. Danish's MRI Brain is still not scheduled.  I contacted precert coordinator, Darlena to check on status of authorization.  She notified me it is still pending.  I will update Dr. Sondra Come and Dr. Julien Nordmann  Treatment Phase Pre-Tx/Tx Discussion  Barriers/Navigation Needs Coordination of Care  Interventions Coordination of Care  Coordination of Care Appts  Acuity Level 2  Acuity Level 2 Assistance expediting appointments  Time Spent with Patient 30

## 2016-01-17 NOTE — Progress Notes (Signed)
After reviewing pt's treatment plan there aren't any foundations offering copay assistance for her Dx.  Since pt will be receiving radiation treatment, I emailed Jenny Reichmann and [facility] requesting they reach out to the pt regarding the [place] to assist w/ gas cards.

## 2016-01-21 ENCOUNTER — Other Ambulatory Visit: Payer: Medicare HMO

## 2016-01-21 ENCOUNTER — Other Ambulatory Visit: Payer: Self-pay | Admitting: *Deleted

## 2016-01-22 ENCOUNTER — Ambulatory Visit
Admission: RE | Admit: 2016-01-22 | Discharge: 2016-01-22 | Disposition: A | Discharge status: Home / Self Care | Payer: Medicare HMO | Source: Ambulatory Visit | Attending: Radiation Oncology | Admitting: Radiation Oncology

## 2016-01-22 ENCOUNTER — Other Ambulatory Visit (HOSPITAL_BASED_OUTPATIENT_CLINIC_OR_DEPARTMENT_OTHER): Payer: Medicare HMO

## 2016-01-22 ENCOUNTER — Ambulatory Visit (HOSPITAL_BASED_OUTPATIENT_CLINIC_OR_DEPARTMENT_OTHER): Payer: Medicare HMO

## 2016-01-22 ENCOUNTER — Encounter: Payer: Self-pay | Admitting: Radiation Oncology

## 2016-01-22 VITALS — BP 106/82 | HR 77 | Temp 98.5°F | Resp 22

## 2016-01-22 VITALS — BP 119/66 | HR 66 | Temp 98.0°F | Ht 67.0 in | Wt 140.2 lb

## 2016-01-22 DIAGNOSIS — Z5111 Encounter for antineoplastic chemotherapy: Secondary | ICD-10-CM

## 2016-01-22 DIAGNOSIS — C342 Malignant neoplasm of middle lobe, bronchus or lung: Secondary | ICD-10-CM

## 2016-01-22 DIAGNOSIS — C3491 Malignant neoplasm of unspecified part of right bronchus or lung: Secondary | ICD-10-CM

## 2016-01-22 DIAGNOSIS — Z510 Encounter for antineoplastic radiation therapy: Secondary | ICD-10-CM | POA: Diagnosis not present

## 2016-01-22 LAB — COMPREHENSIVE METABOLIC PANEL
ALBUMIN: 3.6 g/dL (ref 3.5–5.0)
ALK PHOS: 240 U/L — AB (ref 40–150)
ALT: <9 U/L (ref 0–55)
ANION GAP: 10 meq/L (ref 3–11)
AST: 15 U/L (ref 5–34)
BUN: 14 mg/dL (ref 7.0–26.0)
CALCIUM: 9.5 mg/dL (ref 8.4–10.4)
CO2: 26 mEq/L (ref 22–29)
Chloride: 109 mEq/L (ref 98–109)
Creatinine: 0.8 mg/dL (ref 0.6–1.1)
EGFR: 86 mL/min/{1.73_m2} — AB (ref >90)
Glucose: 69 mg/dl — ABNORMAL LOW (ref 70–140)
Potassium: 4.1 mEq/L (ref 3.5–5.1)
Sodium: 145 mEq/L (ref 136–145)
TOTAL PROTEIN: 6.8 g/dL (ref 6.4–8.3)

## 2016-01-22 LAB — CBC WITH DIFFERENTIAL/PLATELET
BASO%: 0.8 % (ref 0.0–2.0)
BASOS ABS: 0 10*3/uL (ref 0.0–0.1)
EOS%: 1.4 % (ref 0.0–7.0)
Eosinophils Absolute: 0.1 10*3/uL (ref 0.0–0.5)
HEMATOCRIT: 41.2 % (ref 34.8–46.6)
HGB: 13.9 g/dL (ref 11.6–15.9)
LYMPH#: 2.5 10*3/uL (ref 0.9–3.3)
LYMPH%: 43.9 % (ref 14.0–49.7)
MCH: 29.4 pg (ref 25.1–34.0)
MCHC: 33.8 g/dL (ref 31.5–36.0)
MCV: 87.1 fL (ref 79.5–101.0)
MONO#: 0.4 10*3/uL (ref 0.1–0.9)
MONO%: 6.4 % (ref 0.0–14.0)
NEUT#: 2.7 10*3/uL (ref 1.5–6.5)
NEUT%: 47.5 % (ref 38.4–76.8)
Platelets: 206 10*3/uL (ref 145–400)
RBC: 4.73 10*6/uL (ref 3.70–5.45)
RDW: 14.3 % (ref 11.2–14.5)
WBC: 5.8 10*3/uL (ref 3.9–10.3)

## 2016-01-22 MED ORDER — SODIUM CHLORIDE 0.9 % IV SOLN
20.0000 mg | Freq: Once | INTRAVENOUS | Status: AC
  Administered 2016-01-22: 20 mg via INTRAVENOUS
  Filled 2016-01-22: qty 2

## 2016-01-22 MED ORDER — PALONOSETRON HCL INJECTION 0.25 MG/5ML
INTRAVENOUS | Status: AC
  Filled 2016-01-22: qty 5

## 2016-01-22 MED ORDER — DIPHENHYDRAMINE HCL 50 MG/ML IJ SOLN
INTRAMUSCULAR | Status: AC
  Filled 2016-01-22: qty 1

## 2016-01-22 MED ORDER — SODIUM CHLORIDE 0.9 % IV SOLN
45.0000 mg/m2 | Freq: Once | INTRAVENOUS | Status: AC
  Administered 2016-01-22: 78 mg via INTRAVENOUS
  Filled 2016-01-22: qty 13

## 2016-01-22 MED ORDER — FAMOTIDINE IN NACL 20-0.9 MG/50ML-% IV SOLN
INTRAVENOUS | Status: AC
  Filled 2016-01-22: qty 50

## 2016-01-22 MED ORDER — FAMOTIDINE IN NACL 20-0.9 MG/50ML-% IV SOLN
20.0000 mg | Freq: Once | INTRAVENOUS | Status: AC
  Administered 2016-01-22: 20 mg via INTRAVENOUS

## 2016-01-22 MED ORDER — SODIUM CHLORIDE 0.9 % IV SOLN
159.6000 mg | Freq: Once | INTRAVENOUS | Status: AC
  Administered 2016-01-22: 160 mg via INTRAVENOUS
  Filled 2016-01-22: qty 16

## 2016-01-22 MED ORDER — SODIUM CHLORIDE 0.9 % IV SOLN
Freq: Once | INTRAVENOUS | Status: AC
  Administered 2016-01-22: 12:00:00 via INTRAVENOUS

## 2016-01-22 MED ORDER — DIPHENHYDRAMINE HCL 50 MG/ML IJ SOLN
50.0000 mg | Freq: Once | INTRAMUSCULAR | Status: AC
  Administered 2016-01-22: 50 mg via INTRAVENOUS

## 2016-01-22 MED ORDER — PALONOSETRON HCL INJECTION 0.25 MG/5ML
0.2500 mg | Freq: Once | INTRAVENOUS | Status: AC
  Administered 2016-01-22: 0.25 mg via INTRAVENOUS

## 2016-01-22 NOTE — Progress Notes (Signed)
Financial Counselor--spoke with patient today--she will bring in her income verification to see if she qualifies for [employer]

## 2016-01-22 NOTE — Progress Notes (Signed)
  Radiation Oncology         [phone] ________________________________  Name: Courtney Zuniga MRN: [id number]  Date: 01/22/2016  DOB: November 21, 1950  Weekly Radiation Therapy Management      ICD-9-CM ICD-10-CM   1. Adenocarcinoma of right lung, stage 3 (H[facility]) 162.9 C34.91      Current Dose: 2.0 Gy     Planned Dose:  60 Gy  Narrative . . . . . . . . The patient presents for routine under treatment assessment.                                   The patient is without complaint.                                 Set-up films were reviewed. Ms. Carizma  has completed 1 fractions to her right lung.  She denies having pain.  She reports having a frequent cough with white sputum.  She denies having hemoptysis.  She reports feeling short of breath.  She had chemotherapy today.                                   The chart was checked. Physical Findings. . .  height is '5\' 7"'$  (1.702 m) and weight is 140 lb 3.2 oz (63.6 kg). Her oral temperature is 98 F (36.7 C). Her blood pressure is 119/66 and her pulse is 66. Her oxygen saturation is 100%. . Weight essentially stable.  No significant changes.Lungs are clear to auscultation bilaterally. Heart has regular rate and rhythm. No palpable cervical, supraclavicular, or axillary adenopathy. Abdomen soft, non-tender, normal bowel sounds.  Impression . . . . . . . The patient is tolerating radiation. Plan . . . . . . . . . . . . Continue treatment as planned.  ________________________________   Blair Promise, PhD, MD  This document serves as a record of services personally performed by Gery Pray, MD. It was created on his behalf by Truddie Hidden, a trained medical scribe. The creation of this record is based on the scribe's personal observations and the provider's statements to them. This document has been checked and approved by the attending provider.

## 2016-01-22 NOTE — Progress Notes (Signed)
Courtney Zuniga has completed 1 fractions to her right lung.  She denies having pain.  She reports having a frequent cough with white sputum.  She denies having hemoptysis.  She reports feeling short of breath.  She had chemotherapy today.  BP 119/66 (BP Location: Right Arm, Patient Position: Sitting)   Pulse 66   Temp 98 F (36.7 C) (Oral)   Ht '5\' 7"'$  (1.702 m)   Wt 140 lb 3.2 oz (63.6 kg)   SpO2 100%   BMI 21.96 kg/m    Wt Readings from Last 3 Encounters:  01/22/16 140 lb 3.2 oz (63.6 kg)  01/10/16 138 lb 3.2 oz (62.7 kg)  01/09/16 140 lb (63.5 kg)

## 2016-01-22 NOTE — Patient Instructions (Signed)
[facility] Discharge Instructions for Patients Receiving Chemotherapy  Today you received the following chemotherapy agents Taxol and Carboplatin  To help prevent nausea and vomiting after your treatment, we encourage you to take your nausea medication as directed.   If you develop nausea and vomiting that is not controlled by your nausea medication, call the clinic.   BELOW ARE SYMPTOMS THAT SHOULD BE REPORTED IMMEDIATELY:  *FEVER GREATER THAN 100.5 F  *CHILLS WITH OR WITHOUT FEVER  NAUSEA AND VOMITING THAT IS NOT CONTROLLED WITH YOUR NAUSEA MEDICATION  *UNUSUAL SHORTNESS OF BREATH  *UNUSUAL BRUISING OR BLEEDING  TENDERNESS IN MOUTH AND THROAT WITH OR WITHOUT PRESENCE OF ULCERS  *URINARY PROBLEMS  *BOWEL PROBLEMS  UNUSUAL RASH Items with * indicate a potential emergency and should be followed up as soon as possible.  Feel free to call the clinic you have any questions or concerns. The clinic phone number is [phone].  Please show the [facility] at check-in to the Emergency Department and triage nurse.      Paclitaxel injection What is this medicine? PACLITAXEL (PAK li TAX el) is a chemotherapy drug. It targets fast dividing cells, like cancer cells, and causes these cells to die. This medicine is used to treat ovarian cancer, breast cancer, and other cancers. This medicine may be used for other purposes; ask your health care provider or pharmacist if you have questions. What should I tell my health care provider before I take this medicine? They need to know if you have any of these conditions: -blood disorders -irregular heartbeat -infection (especially a virus infection such as chickenpox, cold sores, or herpes) -liver disease -previous or ongoing radiation therapy -an unusual or allergic reaction to paclitaxel, alcohol, polyoxyethylated castor oil, other chemotherapy agents, other medicines, foods, dyes, or preservatives -pregnant or  trying to get pregnant -breast-feeding How should I use this medicine? This drug is given as an infusion into a vein. It is administered in a hospital or clinic by a specially trained health care professional. Talk to your pediatrician regarding the use of this medicine in children. Special care may be needed. Overdosage: If you think you have taken too much of this medicine contact a poison control center or emergency room at once. NOTE: This medicine is only for you. Do not share this medicine with others. What if I miss a dose? It is important not to miss your dose. Call your doctor or health care professional if you are unable to keep an appointment. What may interact with this medicine? Do not take this medicine with any of the following medications: -disulfiram -metronidazole This medicine may also interact with the following medications: -cyclosporine -diazepam -ketoconazole -medicines to increase blood counts like filgrastim, pegfilgrastim, sargramostim -other chemotherapy drugs like cisplatin, doxorubicin, epirubicin, etoposide, teniposide, vincristine -quinidine -testosterone -vaccines -verapamil Talk to your doctor or health care professional before taking any of these medicines: -acetaminophen -aspirin -ibuprofen -ketoprofen -naproxen This list may not describe all possible interactions. Give your health care provider a list of all the medicines, herbs, non-prescription drugs, or dietary supplements you use. Also tell them if you smoke, drink alcohol, or use illegal drugs. Some items may interact with your medicine. What should I watch for while using this medicine? Your condition will be monitored carefully while you are receiving this medicine. You will need important blood work done while you are taking this medicine. This drug may make you feel generally unwell. This is not uncommon, as chemotherapy can affect  healthy cells as well as cancer cells. Report any side  effects. Continue your course of treatment even though you feel ill unless your doctor tells you to stop. This medicine can cause serious allergic reactions. To reduce your risk you will need to take other medicine(s) before treatment with this medicine. In some cases, you may be given additional medicines to help with side effects. Follow all directions for their use. Call your doctor or health care professional for advice if you get a fever, chills or sore throat, or other symptoms of a cold or flu. Do not treat yourself. This drug decreases your body's ability to fight infections. Try to avoid being around people who are sick. This medicine may increase your risk to bruise or bleed. Call your doctor or health care professional if you notice any unusual bleeding. Be careful brushing and flossing your teeth or using a toothpick because you may get an infection or bleed more easily. If you have any dental work done, tell your dentist you are receiving this medicine. Avoid taking products that contain aspirin, acetaminophen, ibuprofen, naproxen, or ketoprofen unless instructed by your doctor. These medicines may hide a fever. Do not become pregnant while taking this medicine. Women should inform their doctor if they wish to become pregnant or think they might be pregnant. There is a potential for serious side effects to an unborn child. Talk to your health care professional or pharmacist for more information. Do not breast-feed an infant while taking this medicine. Men are advised not to father a child while receiving this medicine. This product may contain alcohol. Ask your pharmacist or healthcare provider if this medicine contains alcohol. Be sure to tell all healthcare providers you are taking this medicine. Certain medicines, like metronidazole and disulfiram, can cause an unpleasant reaction when taken with alcohol. The reaction includes flushing, headache, nausea, vomiting, sweating, and increased  thirst. The reaction can last from 30 minutes to several hours. What side effects may I notice from receiving this medicine? Side effects that you should report to your doctor or health care professional as soon as possible: -allergic reactions like skin rash, itching or hives, swelling of the face, lips, or tongue -low blood counts - This drug may decrease the number of white blood cells, red blood cells and platelets. You may be at increased risk for infections and bleeding. -signs of infection - fever or chills, cough, sore throat, pain or difficulty passing urine -signs of decreased platelets or bleeding - bruising, pinpoint red spots on the skin, black, tarry stools, nosebleeds -signs of decreased red blood cells - unusually weak or tired, fainting spells, lightheadedness -breathing problems -chest pain -high or low blood pressure -mouth sores -nausea and vomiting -pain, swelling, redness or irritation at the injection site -pain, tingling, numbness in the hands or feet -slow or irregular heartbeat -swelling of the ankle, feet, hands Side effects that usually do not require medical attention (report to your doctor or health care professional if they continue or are bothersome): -bone pain -complete hair loss including hair on your head, underarms, pubic hair, eyebrows, and eyelashes -changes in the color of fingernails -diarrhea -loosening of the fingernails -loss of appetite -muscle or joint pain -red flush to skin -sweating This list may not describe all possible side effects. Call your doctor for medical advice about side effects. You may report side effects to FDA at 1-800-FDA-1088. Where should I keep my medicine? This drug is given in a hospital or clinic and will  not be stored at home. NOTE: This sheet is a summary. It may not cover all possible information. If you have questions about this medicine, talk to your doctor, pharmacist, or health care provider.    2016,  Elsevier/Gold Standard. (2014-11-16 13:02:56)     Carboplatin injection What is this medicine? CARBOPLATIN (KAR boe pla tin) is a chemotherapy drug. It targets fast dividing cells, like cancer cells, and causes these cells to die. This medicine is used to treat ovarian cancer and many other cancers. This medicine may be used for other purposes; ask your health care provider or pharmacist if you have questions. What should I tell my health care provider before I take this medicine? They need to know if you have any of these conditions: -blood disorders -hearing problems -kidney disease -recent or ongoing radiation therapy -an unusual or allergic reaction to carboplatin, cisplatin, other chemotherapy, other medicines, foods, dyes, or preservatives -pregnant or trying to get pregnant -breast-feeding How should I use this medicine? This drug is usually given as an infusion into a vein. It is administered in a hospital or clinic by a specially trained health care professional. Talk to your pediatrician regarding the use of this medicine in children. Special care may be needed. Overdosage: If you think you have taken too much of this medicine contact a poison control center or emergency room at once. NOTE: This medicine is only for you. Do not share this medicine with others. What if I miss a dose? It is important not to miss a dose. Call your doctor or health care professional if you are unable to keep an appointment. What may interact with this medicine? -medicines for seizures -medicines to increase blood counts like filgrastim, pegfilgrastim, sargramostim -some antibiotics like amikacin, gentamicin, neomycin, streptomycin, tobramycin -vaccines Talk to your doctor or health care professional before taking any of these medicines: -acetaminophen -aspirin -ibuprofen -ketoprofen -naproxen This list may not describe all possible interactions. Give your health care provider a list of all  the medicines, herbs, non-prescription drugs, or dietary supplements you use. Also tell them if you smoke, drink alcohol, or use illegal drugs. Some items may interact with your medicine. What should I watch for while using this medicine? Your condition will be monitored carefully while you are receiving this medicine. You will need important blood work done while you are taking this medicine. This drug may make you feel generally unwell. This is not uncommon, as chemotherapy can affect healthy cells as well as cancer cells. Report any side effects. Continue your course of treatment even though you feel ill unless your doctor tells you to stop. In some cases, you may be given additional medicines to help with side effects. Follow all directions for their use. Call your doctor or health care professional for advice if you get a fever, chills or sore throat, or other symptoms of a cold or flu. Do not treat yourself. This drug decreases your body's ability to fight infections. Try to avoid being around people who are sick. This medicine may increase your risk to bruise or bleed. Call your doctor or health care professional if you notice any unusual bleeding. Be careful brushing and flossing your teeth or using a toothpick because you may get an infection or bleed more easily. If you have any dental work done, tell your dentist you are receiving this medicine. Avoid taking products that contain aspirin, acetaminophen, ibuprofen, naproxen, or ketoprofen unless instructed by your doctor. These medicines may hide a fever. Do  not become pregnant while taking this medicine. Women should inform their doctor if they wish to become pregnant or think they might be pregnant. There is a potential for serious side effects to an unborn child. Talk to your health care professional or pharmacist for more information. Do not breast-feed an infant while taking this medicine. What side effects may I notice from receiving this  medicine? Side effects that you should report to your doctor or health care professional as soon as possible: -allergic reactions like skin rash, itching or hives, swelling of the face, lips, or tongue -signs of infection - fever or chills, cough, sore throat, pain or difficulty passing urine -signs of decreased platelets or bleeding - bruising, pinpoint red spots on the skin, black, tarry stools, nosebleeds -signs of decreased red blood cells - unusually weak or tired, fainting spells, lightheadedness -breathing problems -changes in hearing -changes in vision -chest pain -high blood pressure -low blood counts - This drug may decrease the number of white blood cells, red blood cells and platelets. You may be at increased risk for infections and bleeding. -nausea and vomiting -pain, swelling, redness or irritation at the injection site -pain, tingling, numbness in the hands or feet -problems with balance, talking, walking -trouble passing urine or change in the amount of urine Side effects that usually do not require medical attention (report to your doctor or health care professional if they continue or are bothersome): -hair loss -loss of appetite -metallic taste in the mouth or changes in taste This list may not describe all possible side effects. Call your doctor for medical advice about side effects. You may report side effects to FDA at 1-800-FDA-1088. Where should I keep my medicine? This drug is given in a hospital or clinic and will not be stored at home. NOTE: This sheet is a summary. It may not cover all possible information. If you have questions about this medicine, talk to your doctor, pharmacist, or health care provider.    2016, [employer]/Gold Standard. (2007-07-06 14:38:05)

## 2016-01-22 NOTE — Progress Notes (Signed)
  Radiation Oncology         [phone] ________________________________  Name: Kalsey Lull MRN: [id number]  Date: 01/22/2016  DOB: February 14, 1951  Simulation Verification Note    ICD-9-CM ICD-10-CM   1. Adenocarcinoma of right lung, stage 3 (HCC) 162.9 C34.91     Status: outpatient  NARRATIVE: The patient was brought to the treatment unit and placed in the planned treatment position. The clinical setup was verified. Then port films were obtained and uploaded to the radiation oncology medical record software.  The treatment beams were carefully compared against the planned radiation fields. The position location and shape of the radiation fields was reviewed. They targeted volume of tissue appears to be appropriately covered by the radiation beams. Organs at risk appear to be excluded as planned.  Based on my personal review, I approved the simulation verification. The patient's treatment will proceed as planned.  -----------------------------------  Blair Promise, PhD, MD

## 2016-01-23 ENCOUNTER — Encounter: Payer: Self-pay | Admitting: Radiation Oncology

## 2016-01-23 ENCOUNTER — Ambulatory Visit
Admission: RE | Admit: 2016-01-23 | Discharge: 2016-01-23 | Disposition: A | Discharge status: Home / Self Care | Payer: Medicare HMO | Source: Ambulatory Visit | Attending: Radiation Oncology | Admitting: Radiation Oncology

## 2016-01-23 ENCOUNTER — Telehealth: Payer: Self-pay | Admitting: Internal Medicine

## 2016-01-23 DIAGNOSIS — Z510 Encounter for antineoplastic radiation therapy: Secondary | ICD-10-CM | POA: Diagnosis not present

## 2016-01-23 NOTE — Progress Notes (Addendum)
Financial Counselor--Patient brought in her income verification--qualifies for 400.00 [employer] grant--gave her a gas card today--scanned documents and entered into sharepoint

## 2016-01-23 NOTE — Telephone Encounter (Signed)
RECALL FOR TCS °

## 2016-01-24 ENCOUNTER — Ambulatory Visit
Admission: RE | Admit: 2016-01-24 | Discharge: 2016-01-24 | Disposition: A | Discharge status: Home / Self Care | Payer: Medicare HMO | Source: Ambulatory Visit | Attending: Radiation Oncology | Admitting: Radiation Oncology

## 2016-01-24 DIAGNOSIS — Z510 Encounter for antineoplastic radiation therapy: Secondary | ICD-10-CM | POA: Diagnosis not present

## 2016-01-24 DIAGNOSIS — C3491 Malignant neoplasm of unspecified part of right bronchus or lung: Secondary | ICD-10-CM

## 2016-01-24 MED ORDER — SONAFINE EX EMUL
1.0000 "application " | Freq: Once | CUTANEOUS | Status: AC
  Administered 2016-01-24: 1 via TOPICAL

## 2016-01-24 NOTE — Telephone Encounter (Signed)
Letter mailed

## 2016-01-24 NOTE — Progress Notes (Signed)
Pt here for patient teaching.  Pt given Radiation and You booklet and Sonafine.   Reviewed areas of pertinence such as fatigue, skin changes and throat changes . Pt able to give teach back of to pat skin,apply Sonafine bid and avoid applying anything to skin within 4 hours of treatment. Pt demonstrated understanding and verbalizes understanding of information given and will contact nursing with any questions or concerns.

## 2016-01-25 ENCOUNTER — Telehealth: Payer: Self-pay | Admitting: Oncology

## 2016-01-25 ENCOUNTER — Ambulatory Visit
Admission: RE | Admit: 2016-01-25 | Discharge: 2016-01-25 | Disposition: A | Discharge status: Home / Self Care | Payer: Medicare HMO | Source: Ambulatory Visit | Attending: Radiation Oncology | Admitting: Radiation Oncology

## 2016-01-25 ENCOUNTER — Encounter: Payer: Self-pay | Admitting: *Deleted

## 2016-01-25 ENCOUNTER — Ambulatory Visit (HOSPITAL_COMMUNITY)
Admission: RE | Admit: 2016-01-25 | Discharge: 2016-01-25 | Disposition: A | Discharge status: Home / Self Care | Payer: Medicare HMO | Source: Ambulatory Visit | Attending: Internal Medicine | Admitting: Internal Medicine

## 2016-01-25 DIAGNOSIS — C3491 Malignant neoplasm of unspecified part of right bronchus or lung: Secondary | ICD-10-CM | POA: Insufficient documentation

## 2016-01-25 DIAGNOSIS — Z510 Encounter for antineoplastic radiation therapy: Secondary | ICD-10-CM | POA: Diagnosis not present

## 2016-01-25 DIAGNOSIS — D164 Benign neoplasm of bones of skull and face: Secondary | ICD-10-CM | POA: Diagnosis not present

## 2016-01-25 DIAGNOSIS — Z5111 Encounter for antineoplastic chemotherapy: Secondary | ICD-10-CM

## 2016-01-25 MED ORDER — GADOBENATE DIMEGLUMINE 529 MG/ML IV SOLN
13.0000 mL | Freq: Once | INTRAVENOUS | Status: AC | PRN
  Administered 2016-01-25: 13 mL via INTRAVENOUS

## 2016-01-25 MED ORDER — ENSURE PLUS HN PO LIQD: 4.0000 | Freq: Every day | ORAL | 0 refills | Status: AC

## 2016-01-25 MED ORDER — ENSURE PLUS HN PO LIQD: 4.0000 | Freq: Every day | ORAL | 0 refills | Status: DC

## 2016-01-25 MED FILL — ENSURE PLUS VANILLA 4X6X8OZ: 30 days supply | Qty: 28440 | Fill #0

## 2016-01-25 NOTE — Telephone Encounter (Signed)
Called in prescription for Ensure Plus to [facility] per Dr. Sondra Come.

## 2016-01-28 ENCOUNTER — Other Ambulatory Visit (HOSPITAL_BASED_OUTPATIENT_CLINIC_OR_DEPARTMENT_OTHER): Payer: Medicare HMO

## 2016-01-28 ENCOUNTER — Ambulatory Visit (HOSPITAL_BASED_OUTPATIENT_CLINIC_OR_DEPARTMENT_OTHER): Payer: Medicare HMO

## 2016-01-28 ENCOUNTER — Encounter: Payer: Self-pay | Admitting: Internal Medicine

## 2016-01-28 ENCOUNTER — Telehealth: Payer: Self-pay | Admitting: Medical Oncology

## 2016-01-28 ENCOUNTER — Encounter: Payer: Self-pay | Admitting: *Deleted

## 2016-01-28 ENCOUNTER — Ambulatory Visit (HOSPITAL_BASED_OUTPATIENT_CLINIC_OR_DEPARTMENT_OTHER): Payer: Medicare HMO | Admitting: Internal Medicine

## 2016-01-28 ENCOUNTER — Ambulatory Visit
Admission: RE | Admit: 2016-01-28 | Discharge: 2016-01-28 | Disposition: A | Discharge status: Home / Self Care | Payer: Medicare HMO | Source: Ambulatory Visit | Attending: Radiation Oncology | Admitting: Radiation Oncology

## 2016-01-28 VITALS — BP 114/75 | HR 79 | Temp 98.5°F | Resp 19 | Ht 67.0 in | Wt 143.1 lb

## 2016-01-28 DIAGNOSIS — Z510 Encounter for antineoplastic radiation therapy: Secondary | ICD-10-CM | POA: Diagnosis not present

## 2016-01-28 DIAGNOSIS — Z5111 Encounter for antineoplastic chemotherapy: Secondary | ICD-10-CM

## 2016-01-28 DIAGNOSIS — C342 Malignant neoplasm of middle lobe, bronchus or lung: Secondary | ICD-10-CM

## 2016-01-28 DIAGNOSIS — R110 Nausea: Secondary | ICD-10-CM | POA: Diagnosis not present

## 2016-01-28 DIAGNOSIS — C3491 Malignant neoplasm of unspecified part of right bronchus or lung: Secondary | ICD-10-CM

## 2016-01-28 LAB — COMPREHENSIVE METABOLIC PANEL
ALK PHOS: 220 U/L — AB (ref 40–150)
ALT: 9 U/L (ref 0–55)
AST: 16 U/L (ref 5–34)
Albumin: 3.7 g/dL (ref 3.5–5.0)
Anion Gap: 10 mEq/L (ref 3–11)
BUN: 15.7 mg/dL (ref 7.0–26.0)
CHLORIDE: 108 meq/L (ref 98–109)
CO2: 26 meq/L (ref 22–29)
Calcium: 9.4 mg/dL (ref 8.4–10.4)
Creatinine: 0.7 mg/dL (ref 0.6–1.1)
GLUCOSE: 57 mg/dL — AB (ref 70–140)
POTASSIUM: 4 meq/L (ref 3.5–5.1)
SODIUM: 144 meq/L (ref 136–145)
Total Bilirubin: <0.22 mg/dL (ref 0.20–1.20)
Total Protein: 6.6 g/dL (ref 6.4–8.3)

## 2016-01-28 LAB — CBC WITH DIFFERENTIAL/PLATELET
BASO%: 0.7 % (ref 0.0–2.0)
BASOS ABS: 0 10*3/uL (ref 0.0–0.1)
EOS ABS: 0.1 10*3/uL (ref 0.0–0.5)
EOS%: 1.3 % (ref 0.0–7.0)
HCT: 40.2 % (ref 34.8–46.6)
HEMOGLOBIN: 13.6 g/dL (ref 11.6–15.9)
LYMPH%: 33.9 % (ref 14.0–49.7)
MCH: 29.1 pg (ref 25.1–34.0)
MCHC: 33.8 g/dL (ref 31.5–36.0)
MCV: 85.9 fL (ref 79.5–101.0)
MONO#: 0.2 10*3/uL (ref 0.1–0.9)
MONO%: 3.4 % (ref 0.0–14.0)
NEUT#: 3.1 10*3/uL (ref 1.5–6.5)
NEUT%: 60.7 % (ref 38.4–76.8)
Platelets: 181 10*3/uL (ref 145–400)
RBC: 4.68 10*6/uL (ref 3.70–5.45)
RDW: 14.3 % (ref 11.2–14.5)
WBC: 5.1 10*3/uL (ref 3.9–10.3)
lymph#: 1.7 10*3/uL (ref 0.9–3.3)

## 2016-01-28 MED ORDER — FAMOTIDINE IN NACL 20-0.9 MG/50ML-% IV SOLN
20.0000 mg | Freq: Once | INTRAVENOUS | Status: AC
  Administered 2016-01-28: 20 mg via INTRAVENOUS

## 2016-01-28 MED ORDER — DIPHENHYDRAMINE HCL 50 MG/ML IJ SOLN
50.0000 mg | Freq: Once | INTRAMUSCULAR | Status: AC
  Administered 2016-01-28: 50 mg via INTRAVENOUS

## 2016-01-28 MED ORDER — DIPHENHYDRAMINE HCL 50 MG/ML IJ SOLN
INTRAMUSCULAR | Status: AC
  Filled 2016-01-28: qty 1

## 2016-01-28 MED ORDER — PALONOSETRON HCL INJECTION 0.25 MG/5ML
INTRAVENOUS | Status: AC
  Filled 2016-01-28: qty 5

## 2016-01-28 MED ORDER — FAMOTIDINE IN NACL 20-0.9 MG/50ML-% IV SOLN
INTRAVENOUS | Status: AC
  Filled 2016-01-28: qty 50

## 2016-01-28 MED ORDER — SODIUM CHLORIDE 0.9 % IV SOLN
20.0000 mg | Freq: Once | INTRAVENOUS | Status: AC
  Administered 2016-01-28: 20 mg via INTRAVENOUS
  Filled 2016-01-28: qty 2

## 2016-01-28 MED ORDER — CARBOPLATIN CHEMO INJECTION 450 MG/45ML
159.6000 mg | Freq: Once | INTRAVENOUS | Status: AC
  Administered 2016-01-28: 160 mg via INTRAVENOUS
  Filled 2016-01-28: qty 16

## 2016-01-28 MED ORDER — PALONOSETRON HCL INJECTION 0.25 MG/5ML
0.2500 mg | Freq: Once | INTRAVENOUS | Status: AC
  Administered 2016-01-28: 0.25 mg via INTRAVENOUS

## 2016-01-28 MED ORDER — SODIUM CHLORIDE 0.9 % IV SOLN
45.0000 mg/m2 | Freq: Once | INTRAVENOUS | Status: AC
  Administered 2016-01-28: 78 mg via INTRAVENOUS
  Filled 2016-01-28: qty 13

## 2016-01-28 MED ORDER — SODIUM CHLORIDE 0.9 % IV SOLN
Freq: Once | INTRAVENOUS | Status: AC
  Administered 2016-01-28: 15:00:00 via INTRAVENOUS

## 2016-01-28 NOTE — Patient Instructions (Signed)
[facility] Discharge Instructions for Patients Receiving Chemotherapy  Today you received the following chemotherapy agents Taxol/Carboplatin  To help prevent nausea and vomiting after your treatment, we encourage you to take your nausea medication    If you develop nausea and vomiting that is not controlled by your nausea medication, call the clinic.   BELOW ARE SYMPTOMS THAT SHOULD BE REPORTED IMMEDIATELY:  *FEVER GREATER THAN 100.5 F  *CHILLS WITH OR WITHOUT FEVER  NAUSEA AND VOMITING THAT IS NOT CONTROLLED WITH YOUR NAUSEA MEDICATION  *UNUSUAL SHORTNESS OF BREATH  *UNUSUAL BRUISING OR BLEEDING  TENDERNESS IN MOUTH AND THROAT WITH OR WITHOUT PRESENCE OF ULCERS  *URINARY PROBLEMS  *BOWEL PROBLEMS  UNUSUAL RASH Items with * indicate a potential emergency and should be followed up as soon as possible.  Feel free to call the clinic you have any questions or concerns. The clinic phone number is [phone].  Please show the CHEMO ALERT CARD at check-in to the Emergency Department and triage nurse.   

## 2016-01-28 NOTE — Progress Notes (Signed)
Courtney Zuniga [phone]   [phone]  OFFICE PROGRESS NOTE  Barry Dienes, NP [place] [address]  DIAGNOSIS: Stage IIIA (T1b, N2, M0) non-small cell lung cancer, adenocarcinoma presented with right middle lobe lung nodule in addition to mediastinal lymphadenopathy. The patient also has a highly suspicious small nodule in the left upper lobe that could change her disease to a stage IV or at least another synchronous primary lesion in the left upper lobe.  PRIOR THERAPY: None.  CURRENT THERAPY: A course of concurrent chemoradiation with weekly carboplatin for AUC of 2 and paclitaxel 45 MG/M2.  INTERVAL HISTORY: Courtney Zuniga 65 y.o. female returns to the clinic today for follow-up visit accompanied by her daughter. The patient tolerated the first week of her treatment fairly well with no significant adverse effects except for mild nausea. She again 3 pounds since her last visit. She denied having any significant chest pain, shortness of breath, cough or hemoptysis. She had MRI of the brain performed recently that showed no evidence of metastatic disease to the brain. She is here today to start cycle #2 of her treatment.  MEDICAL HISTORY: Past Medical History:  Diagnosis Date  . Anemia    as a young woman  . Arthritis    osteoartritis  . Asthma   . Brain tumor (benign) ([place]) 2005 [facility]   Benign  . Chronic headaches   . Chronic hip pain   . Chronic pain   . COPD (chronic obstructive pulmonary disease) ([place])   . Coronary artery disease   . Depression   . Encounter for antineoplastic chemotherapy 01/10/2016  . Hypertension   . NSTEMI (non-ST elevated myocardial infarction) ([place])   . On home O2    qhs  . Pneumonia   . Shortness of breath dyspnea     ALLERGIES:  is allergic to no known allergies.  MEDICATIONS:  Current Outpatient Prescriptions  Medication Sig Dispense Refill  . albuterol (PROVENTIL HFA;VENTOLIN HFA) 108  (90 BASE) MCG/ACT inhaler Inhale 2 puffs into the lungs every 6 (six) hours as needed for shortness of breath.     Marland Kitchen albuterol (PROVENTIL) (2.5 MG/3ML) 0.083% nebulizer solution Take 2.5 mg by nebulization every 6 (six) hours as needed for wheezing or shortness of breath.    . Aspirin-Salicylamide-Caffeine (BC HEADACHE POWDER PO) Take 1 packet by mouth daily as needed (headache).    . calcium-vitamin D 250-100 MG-UNIT per tablet Take 1 tablet by mouth daily.    . Cholecalciferol (VITAMIN D3) 5000 units TABS Take 1 tablet by mouth daily.    . Fluticasone-Salmeterol (ADVAIR) 500-50 MCG/DOSE AEPB Inhale 1 puff into the lungs every 12 (twelve) hours.    Marland Kitchen HYDROcodone-acetaminophen (NORCO/VICODIN) 5-325 MG tablet Take 1 tablet by mouth every 6 (six) hours as needed. (Patient not taking: Reported on 01/22/2016) 20 tablet 0  . lisinopril (PRINIVIL,ZESTRIL) 40 MG tablet Take 40 mg by mouth daily.    . nitroGLYCERIN (NITROSTAT) 0.4 MG SL tablet Place 0.4 mg under the tongue every 5 (five) minutes as needed for chest pain.    . Nutritional Supplements (ENSURE PLUS HN) LIQD Take 4 Cans by mouth daily. 120 Can 0  . pantoprazole (PROTONIX) 40 MG tablet Take 40 mg by mouth daily.    . prochlorperazine (COMPAZINE) 10 MG tablet Take 1 tablet (10 mg total) by mouth every 6 (six) hours as needed for nausea or vomiting. 30 tablet 0  . QVAR 80 MCG/ACT inhaler Inhale  1 puff into the lungs 2 (two) times daily as needed.     . Tiotropium Bromide-Olodaterol (STIOLTO RESPIMAT) 2.5-2.5 MCG/ACT AERS Inhale 1 puff into the lungs daily.    . Wound Dressings (SONAFINE) Apply 1 application topically 2 (two) times daily.     No current facility-administered medications for this visit.     SURGICAL HISTORY:  Past Surgical History:  Procedure Laterality Date  . CHOLECYSTECTOMY    . COLONOSCOPY  2015   Results requested from [facility]  . COLONOSCOPY    . ESOPHAGOGASTRODUODENOSCOPY N/A 08/14/2015   Procedure:  ESOPHAGOGASTRODUODENOSCOPY (EGD);  Surgeon: Daneil Dolin, MD;  Location[facility];  Service: Endoscopy;  Laterality: N/A;  215   . ESOPHAGOGASTRODUODENOSCOPY (EGD) WITH PROPOFOL N/A 09/13/2015   Procedure: ESOPHAGOGASTRODUODENOSCOPY (EGD) WITH PROPOFOL;  Surgeon: Milus Banister, MD;  Location: [facility];  Service: Endoscopy;  Laterality: N/A;  . TUMOR REMOVAL  2005   Benign  . UPPER ESOPHAGEAL ENDOSCOPIC ULTRASOUND (EUS)  09/13/2015   Procedure: UPPER ESOPHAGEAL ENDOSCOPIC ULTRASOUND (EUS);  Surgeon: Milus Banister, MD;  Location: [facility];  Service: Endoscopy;;  . VIDEO BRONCHOSCOPY WITH ENDOBRONCHIAL NAVIGATION N/A 12/31/2015   Procedure: VIDEO BRONCHOSCOPY WITH ENDOBRONCHIAL NAVIGATION;  Surgeon: Melrose Nakayama, MD;  Location: [facility];  Service: Thoracic;  Laterality: N/A;  . VIDEO BRONCHOSCOPY WITH ENDOBRONCHIAL ULTRASOUND N/A 11/08/2015   Procedure: VIDEO BRONCHOSCOPY WITH ENDOBRONCHIAL ULTRASOUND;  Surgeon: Ivin Poot, MD;  Location: [facility] OR;  Service: Thoracic;  Laterality: N/A;    REVIEW OF SYSTEMS:  Constitutional: negative Eyes: negative Ears, nose, mouth, throat, and face: negative Respiratory: negative Cardiovascular: negative Gastrointestinal: negative Genitourinary:negative Integument/breast: negative Hematologic/lymphatic: negative Musculoskeletal:negative Neurological: negative Behavioral/Psych: negative Endocrine: negative Allergic/Immunologic: negative   PHYSICAL EXAMINATION: General appearance: alert, cooperative and no distress Head: Normocephalic, without obvious abnormality, atraumatic Neck: no adenopathy, no JVD, supple, symmetrical, trachea midline and thyroid not enlarged, symmetric, no tenderness/mass/nodules Lymph nodes: Cervical, supraclavicular, and axillary nodes normal. Resp: clear to auscultation bilaterally Back: symmetric, no curvature. ROM normal. No CVA tenderness. Cardio: regular rate and rhythm, S1, S2 normal, no murmur, click,  rub or gallop GI: soft, non-tender; bowel sounds normal; no masses,  no organomegaly Extremities: extremities normal, atraumatic, no cyanosis or edema Neurologic: Alert and oriented X 3, normal strength and tone. Normal symmetric reflexes. Normal coordination and gait  ECOG PERFORMANCE STATUS: 1 - Symptomatic but completely ambulatory  Blood pressure 114/75, pulse 79, temperature 98.5 F (36.9 C), temperature source Oral, resp. rate 19, height '5\' 7"'$  (1.702 m), weight 143 lb 1.6 oz (64.9 kg), SpO2 98 %.  LABORATORY DATA: Lab Results  Component Value Date   WBC 5.1 01/28/2016   HGB 13.6 01/28/2016   HCT 40.2 01/28/2016   MCV 85.9 01/28/2016   PLT 181 01/28/2016      Chemistry      Component Value Date/Time   NA 145 01/22/2016 1039   K 4.1 01/22/2016 1039   CL 110 12/31/2015 0941   CO2 26 01/22/2016 1039   BUN 14.0 01/22/2016 1039   CREATININE 0.8 01/22/2016 1039      Component Value Date/Time   CALCIUM 9.5 01/22/2016 1039   ALKPHOS 240 (H) 01/22/2016 1039   AST 15 01/22/2016 1039   ALT <9 01/22/2016 1039   BILITOT <0.30 01/22/2016 1039       RADIOGRAPHIC STUDIES: Dg Chest 2 View  Result Date: 12/31/2015 CLINICAL DATA:  Preop for bronchoscopy.  Right lung nodule. EXAM: CHEST  2 VIEW  COMPARISON:  CT chest 12/07/2015 FINDINGS: The heart size is normal. Right infrahilar lung nodule in retraction is again noted. Emphysematous changes are present. Posterior bilateral lower lobe scarring and atelectasis is present. Postobstructive collapse in the right middle lobe is again seen. IMPRESSION: 1. Right infrahilar lung nodule and postobstructive partial collapse of the right middle lobe. 2. Emphysema. 3. Bibasilar atelectasis or scarring. 4. No acute cardiopulmonary disease. Electronically Signed   By: San Morelle M.D.   On: 12/31/2015 09:46   Mr Jeri Cos EX Contrast  Result Date: 01/25/2016 CLINICAL DATA:  Right lung adenocarcinoma. Evaluation for intracranial metastatic  disease. EXAM: MRI HEAD WITHOUT AND WITH CONTRAST TECHNIQUE: Multiplanar, multiecho pulse sequences of the brain and surrounding structures were obtained without and with intravenous contrast. CONTRAST:  42m MULTIHANCE GADOBENATE DIMEGLUMINE 529 MG/ML IV SOLN COMPARISON:  Brain MR 03/31/2014 FINDINGS: Brain: No acute infarct or intraparenchymal hemorrhage. The midline structures are normal. There is multifocal hyperintense T2-weighted signal within the periventricular, deep and subcortical white matter, most often seen in the setting of chronic microvascular ischemia. No mass lesion or midline shift. No hydrocephalus or extra-axial fluid collection. No contrast-enhancing lesions. Vascular: Major intracranial arterial and venous sinus flow voids are preserved. No evidence of chronic microhemorrhage or amyloid angiopathy. Skull and upper cervical spine: Low T1 weighted, expansile lesion of the frontal calvarium is unchanged. Post surgical changes of prior transsphenoidal resection. Sinuses/Orbits: Bilateral mastoid effusions are unchanged. Normal orbits. IMPRESSION: 1. No intracranial metastatic disease. 2. Unchanged expansile lesion of the frontal calvarium. 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 Electronically Signed   By: KUlyses JarredM.D.   On: 01/25/2016 17:33   Dg C-arm Bronchoscopy  Result Date: 12/31/2015 CLINICAL DATA:  C-ARM BRONCHOSCOPY Fluoroscopy was utilized by the requesting physician.  No radiographic interpretation.    ASSESSMENT AND PLAN: This is a very pleasant 65years old African-American female with stage IIIa non-small cell lung cancer. She is currently undergoing a course of concurrent chemoradiation with weekly carboplatin and paclitaxel is status post 1 cycle. She tolerated the first cycle of her treatment well with no significant adverse effects. MRI of the brain showed no evidence for metastatic disease to the brain. I discussed the results with the patient and her  daughter today. I recommended for the patient to proceed with cycle #2 of her concurrent chemoradiation today as a scheduled. She will come back for follow-up visit in 2 weeks for evaluation before starting cycle #4. She was advised to call immediately if she has any concerning symptoms in the interval. The patient voices understanding of current disease status and treatment options and is in agreement with the current care plan.  All questions were answered. The patient knows to call the clinic with any problems, questions or concerns. We can certainly see the patient much sooner if necessary.  Disclaimer: This note was dictated with voice recognition software. Similar sounding words can inadvertently be transcribed and may not be corrected upon review.

## 2016-01-28 NOTE — Telephone Encounter (Signed)
transportation problems . XRT and Mohamed notified and will see pt today. I told pt to come on and it will take her an hour from [place].

## 2016-01-28 NOTE — Progress Notes (Signed)
Oncology Nurse Navigator Documentation  Oncology Nurse Navigator Flowsheets 01/28/2016  Navigator Location [facility]  Navigator Encounter Type Clinic/MDC/I followed up with Ms. [place] today at clinic.  She was late due to transportation issues.  I spoke with her about issues.  Daughter had to get off work due to other people didn't show up to take her to appt.  I gave her information on [facility] to Recovery through the [employer], Gas card through the [employer], and [facility] transportation information and numbers to call for assistance.  She was thankful for the help.  I also helped her complete [employer] fund required documentation and will get to [facility]  Patient Visit Type MedOnc  Treatment Phase Treatment  Barriers/Navigation Needs Transportation  Interventions Transportations  Coordination of Care Other  Acuity Level 2  Acuity Level 2 Educational needs;Other  Time Spent with Patient 45

## 2016-01-29 ENCOUNTER — Encounter: Payer: Self-pay | Admitting: Radiation Oncology

## 2016-01-29 ENCOUNTER — Ambulatory Visit
Admission: RE | Admit: 2016-01-29 | Discharge: 2016-01-29 | Disposition: A | Discharge status: Home / Self Care | Payer: Medicare HMO | Source: Ambulatory Visit | Attending: Radiation Oncology | Admitting: Radiation Oncology

## 2016-01-29 ENCOUNTER — Encounter: Payer: Self-pay | Admitting: *Deleted

## 2016-01-29 VITALS — BP 129/88 | HR 87 | Temp 97.9°F | Ht 67.0 in | Wt 145.4 lb

## 2016-01-29 DIAGNOSIS — Z510 Encounter for antineoplastic radiation therapy: Secondary | ICD-10-CM | POA: Diagnosis not present

## 2016-01-29 DIAGNOSIS — C3491 Malignant neoplasm of unspecified part of right bronchus or lung: Secondary | ICD-10-CM

## 2016-01-29 NOTE — Progress Notes (Signed)
Completed [facility] application with patient and faxed into [facility].  Polo Riley, MSW, LCSW, OSW-C Clinical Social Worker [facility] [phone]

## 2016-01-29 NOTE — Progress Notes (Signed)
  Radiation Oncology         [phone] ________________________________  Name: Courtney Zuniga MRN: [id number]  Date: 01/29/2016  DOB: July 05, 1950  Weekly Radiation Therapy Management      ICD-9-CM ICD-10-CM   1. Adenocarcinoma of right lung, stage 3 (HCC) 162.9 C34.91      Current Dose: 12 Gy     Planned Dose:  60 Gy  Narrative . . . . . . . . The patient presents for routine under treatment assessment.                                                                  Set-up films were reviewed. has completed 6 fractions to her right lung.  She reports having sharp pains in her right shoulder that run down her arm and in her right hip.  She said this started after treatment.  She also reports having numbness in her fingers which started a long time ago.  She reports her breathing is better and she is able to walk farther now.  She reports having a occasional dry cough.  She denies having a sore throat or trouble swallowing.  Her voice is hoarse.  She had chemotherapy yesterday.  She is using sonafine bid on her right chest and denies having any skin irritation.  The patient reports that the pains in her right shoulder cause her muscles to tighten up. She reports the pain as waxing and waning.   She reports that she hasn't had time to see her family physician.  .                                   The chart was checked. Physical Findings. . .  height is '5\' 7"'$  (1.702 m) and weight is 145 lb 6.4 oz (66 kg). Her oral temperature is 97.9 F (36.6 C). Her blood pressure is 129/88 and her pulse is 87. Her oxygen saturation is 100%. . Weight essentially stable.  No significant changes.Lungs are clear to auscultation bilaterally. Heart has regular rate and rhythm. No palpable cervical, supraclavicular, or axillary adenopathy. Abdomen soft, non-tender, normal bowel sounds.  Impression . . . . . . . The patient is tolerating radiation. Plan . . . . . . . . . . . . Continue treatment as planned. I  will refer the patient to massage therapy for her shoulder pain and neck stiffening.   ________________________________   Blair Promise, PhD, MD  This document serves as a record of services personally performed by Gery Pray, MD. It was created on his behalf by Truddie Hidden, a trained medical scribe. The creation of this record is based on the scribe's personal observations and the provider's statements to them. This document has been checked and approved by the attending provider.

## 2016-01-29 NOTE — Progress Notes (Addendum)
Courtney Zuniga has completed 6 fractions to her right lung.  She reports having sharp pains in her right shoulder that run down her arm and in her right hip.  She said this started after treatment.  She also reports having numbness in her fingers which started a long time ago.  She reports her breathing is better and she is able to walk farther now.  She reports having a occasional dry cough.  She denies having a sore throat or trouble swallowing.  Her voice is hoarse.  She had chemotherapy yesterday.  She is using sonafine bid on her right chest and denies having any skin irritation.    BP 129/88 (BP Location: Right Arm, Patient Position: Sitting)   Pulse 87   Temp 97.9 F (36.6 C) (Oral)   Ht '5\' 7"'$  (1.702 m)   Wt 145 lb 6.4 oz (66 kg)   SpO2 100%   BMI 22.77 kg/m   Wt Readings from Last 3 Encounters:  01/29/16 145 lb 6.4 oz (66 kg)  01/28/16 143 lb 1.6 oz (64.9 kg)  01/22/16 140 lb 3.2 oz (63.6 kg)

## 2016-01-30 ENCOUNTER — Ambulatory Visit
Admission: RE | Admit: 2016-01-30 | Discharge: 2016-01-30 | Disposition: A | Discharge status: Home / Self Care | Payer: Medicare HMO | Source: Ambulatory Visit | Attending: Radiation Oncology | Admitting: Radiation Oncology

## 2016-01-30 ENCOUNTER — Encounter: Payer: Self-pay | Admitting: *Deleted

## 2016-01-30 DIAGNOSIS — Z510 Encounter for antineoplastic radiation therapy: Secondary | ICD-10-CM | POA: Diagnosis not present

## 2016-01-30 NOTE — Progress Notes (Signed)
Oncology Nurse Navigator Documentation  Oncology Nurse Navigator Flowsheets 01/30/2016  Navigator Location [facility]  Navigator Encounter Type Other/I followed up on Courtney Zuniga's schedule today.  I noticed several appts missing.  I notified scheduling to schedule appt on 10/30 for labs, follow up Dr. Julien Nordmann, and chemo.  11/6 labs and chemo 11/13 labs, follow up Dr. Julien Nordmann and chemo. 11/20 labs and follow up Dr. Julien Nordmann.   Treatment Phase Treatment  Barriers/Navigation Needs Coordination of Care  Interventions Coordination of Care  Coordination of Care Appts  Acuity Level 2  Acuity Level 2 Assistance expediting appointments  Time Spent with Patient 30

## 2016-01-31 ENCOUNTER — Ambulatory Visit
Admission: RE | Admit: 2016-01-31 | Discharge: 2016-01-31 | Disposition: A | Discharge status: Home / Self Care | Payer: Medicare HMO | Source: Ambulatory Visit | Attending: Radiation Oncology | Admitting: Radiation Oncology

## 2016-01-31 DIAGNOSIS — Z510 Encounter for antineoplastic radiation therapy: Secondary | ICD-10-CM | POA: Diagnosis not present

## 2016-02-01 ENCOUNTER — Ambulatory Visit
Admission: RE | Admit: 2016-02-01 | Discharge: 2016-02-01 | Disposition: A | Discharge status: Home / Self Care | Payer: Medicare HMO | Source: Ambulatory Visit | Attending: Radiation Oncology | Admitting: Radiation Oncology

## 2016-02-01 DIAGNOSIS — Z510 Encounter for antineoplastic radiation therapy: Secondary | ICD-10-CM | POA: Diagnosis not present

## 2016-02-04 ENCOUNTER — Other Ambulatory Visit (HOSPITAL_BASED_OUTPATIENT_CLINIC_OR_DEPARTMENT_OTHER): Payer: Medicare HMO

## 2016-02-04 ENCOUNTER — Telehealth: Payer: Self-pay | Admitting: *Deleted

## 2016-02-04 ENCOUNTER — Encounter: Payer: Self-pay | Admitting: *Deleted

## 2016-02-04 ENCOUNTER — Ambulatory Visit
Admission: RE | Admit: 2016-02-04 | Discharge: 2016-02-04 | Disposition: A | Discharge status: Home / Self Care | Payer: Medicare HMO | Source: Ambulatory Visit | Attending: Radiation Oncology | Admitting: Radiation Oncology

## 2016-02-04 ENCOUNTER — Ambulatory Visit (HOSPITAL_BASED_OUTPATIENT_CLINIC_OR_DEPARTMENT_OTHER): Payer: Medicare HMO

## 2016-02-04 VITALS — BP 109/73 | HR 98 | Temp 98.1°F | Resp 22

## 2016-02-04 DIAGNOSIS — C3491 Malignant neoplasm of unspecified part of right bronchus or lung: Secondary | ICD-10-CM

## 2016-02-04 DIAGNOSIS — Z5111 Encounter for antineoplastic chemotherapy: Secondary | ICD-10-CM | POA: Diagnosis not present

## 2016-02-04 DIAGNOSIS — C342 Malignant neoplasm of middle lobe, bronchus or lung: Secondary | ICD-10-CM | POA: Diagnosis not present

## 2016-02-04 DIAGNOSIS — Z510 Encounter for antineoplastic radiation therapy: Secondary | ICD-10-CM | POA: Diagnosis not present

## 2016-02-04 LAB — CBC WITH DIFFERENTIAL/PLATELET
BASO%: 0.2 % (ref 0.0–2.0)
Basophils Absolute: 0 10*3/uL (ref 0.0–0.1)
EOS%: 1.4 % (ref 0.0–7.0)
Eosinophils Absolute: 0.1 10*3/uL (ref 0.0–0.5)
HCT: 39.1 % (ref 34.8–46.6)
HEMOGLOBIN: 13.9 g/dL (ref 11.6–15.9)
LYMPH#: 1.3 10*3/uL (ref 0.9–3.3)
LYMPH%: 30.2 % (ref 14.0–49.7)
MCH: 29.5 pg (ref 25.1–34.0)
MCHC: 35.5 g/dL (ref 31.5–36.0)
MCV: 83 fL (ref 79.5–101.0)
MONO#: 0.3 10*3/uL (ref 0.1–0.9)
MONO%: 7.2 % (ref 0.0–14.0)
NEUT%: 61 % (ref 38.4–76.8)
NEUTROS ABS: 2.7 10*3/uL (ref 1.5–6.5)
Platelets: 192 10*3/uL (ref 145–400)
RBC: 4.71 10*6/uL (ref 3.70–5.45)
RDW: 14.6 % — ABNORMAL HIGH (ref 11.2–14.5)
WBC: 4.4 10*3/uL (ref 3.9–10.3)
nRBC: 0 % (ref 0–0)

## 2016-02-04 LAB — COMPREHENSIVE METABOLIC PANEL
ALT: 11 U/L (ref 0–55)
AST: 17 U/L (ref 5–34)
Albumin: 3.8 g/dL (ref 3.5–5.0)
Alkaline Phosphatase: 196 U/L — ABNORMAL HIGH (ref 40–150)
Anion Gap: 11 mEq/L (ref 3–11)
BUN: 18.7 mg/dL (ref 7.0–26.0)
CHLORIDE: 104 meq/L (ref 98–109)
CO2: 25 mEq/L (ref 22–29)
CREATININE: 0.7 mg/dL (ref 0.6–1.1)
Calcium: 10 mg/dL (ref 8.4–10.4)
EGFR: >90 mL/min/{1.73_m2} (ref >90)
GLUCOSE: 75 mg/dL (ref 70–140)
POTASSIUM: 4.5 meq/L (ref 3.5–5.1)
SODIUM: 140 meq/L (ref 136–145)
Total Bilirubin: 0.24 mg/dL (ref 0.20–1.20)
Total Protein: 7.1 g/dL (ref 6.4–8.3)

## 2016-02-04 MED ORDER — SODIUM CHLORIDE 0.9 % IV SOLN
45.0000 mg/m2 | Freq: Once | INTRAVENOUS | Status: AC
  Administered 2016-02-04: 78 mg via INTRAVENOUS
  Filled 2016-02-04: qty 13

## 2016-02-04 MED ORDER — PALONOSETRON HCL INJECTION 0.25 MG/5ML
0.2500 mg | Freq: Once | INTRAVENOUS | Status: AC
  Administered 2016-02-04: 0.25 mg via INTRAVENOUS

## 2016-02-04 MED ORDER — DIPHENHYDRAMINE HCL 50 MG/ML IJ SOLN
INTRAMUSCULAR | Status: AC
  Filled 2016-02-04: qty 1

## 2016-02-04 MED ORDER — SODIUM CHLORIDE 0.9 % IV SOLN
Freq: Once | INTRAVENOUS | Status: AC
  Administered 2016-02-04: 11:00:00 via INTRAVENOUS

## 2016-02-04 MED ORDER — DIPHENHYDRAMINE HCL 50 MG/ML IJ SOLN
50.0000 mg | Freq: Once | INTRAMUSCULAR | Status: AC
  Administered 2016-02-04: 50 mg via INTRAVENOUS

## 2016-02-04 MED ORDER — FAMOTIDINE IN NACL 20-0.9 MG/50ML-% IV SOLN
INTRAVENOUS | Status: AC
  Filled 2016-02-04: qty 50

## 2016-02-04 MED ORDER — PALONOSETRON HCL INJECTION 0.25 MG/5ML
INTRAVENOUS | Status: AC
  Filled 2016-02-04: qty 5

## 2016-02-04 MED ORDER — SODIUM CHLORIDE 0.9 % IV SOLN
159.6000 mg | Freq: Once | INTRAVENOUS | Status: AC
  Administered 2016-02-04: 160 mg via INTRAVENOUS
  Filled 2016-02-04: qty 16

## 2016-02-04 MED ORDER — SODIUM CHLORIDE 0.9 % IV SOLN
20.0000 mg | Freq: Once | INTRAVENOUS | Status: AC
  Administered 2016-02-04: 20 mg via INTRAVENOUS
  Filled 2016-02-04: qty 2

## 2016-02-04 MED ORDER — FAMOTIDINE IN NACL 20-0.9 MG/50ML-% IV SOLN
20.0000 mg | Freq: Once | INTRAVENOUS | Status: AC
Start: 2016-02-04 — End: 2016-02-04
  Administered 2016-02-04: 20 mg via INTRAVENOUS

## 2016-02-04 NOTE — Telephone Encounter (Signed)
Per LOS I have schedule appt, advised scheduler first available given in the afternoon.

## 2016-02-04 NOTE — Progress Notes (Signed)
Received a call from nurse Maudie Mercury in [place].  Pt reports deep burning to esophagus area. Called Dr. Sondra Come in [place] her recommended Carafate, 1gram QID dissolved in 87m H20.  [phone]

## 2016-02-04 NOTE — Patient Instructions (Signed)
[facility] Discharge Instructions for Patients Receiving Chemotherapy  Today you received the following chemotherapy agents Taxol/Carboplatin  To help prevent nausea and vomiting after your treatment, we encourage you to take your nausea medication    If you develop nausea and vomiting that is not controlled by your nausea medication, call the clinic.   BELOW ARE SYMPTOMS THAT SHOULD BE REPORTED IMMEDIATELY:  *FEVER GREATER THAN 100.5 F  *CHILLS WITH OR WITHOUT FEVER  NAUSEA AND VOMITING THAT IS NOT CONTROLLED WITH YOUR NAUSEA MEDICATION  *UNUSUAL SHORTNESS OF BREATH  *UNUSUAL BRUISING OR BLEEDING  TENDERNESS IN MOUTH AND THROAT WITH OR WITHOUT PRESENCE OF ULCERS  *URINARY PROBLEMS  *BOWEL PROBLEMS  UNUSUAL RASH Items with * indicate a potential emergency and should be followed up as soon as possible.  Feel free to call the clinic you have any questions or concerns. The clinic phone number is [phone].  Please show the CHEMO ALERT CARD at check-in to the Emergency Department and triage nurse.   

## 2016-02-05 ENCOUNTER — Ambulatory Visit
Admission: RE | Admit: 2016-02-05 | Discharge: 2016-02-05 | Disposition: A | Discharge status: Home / Self Care | Payer: Medicare HMO | Source: Ambulatory Visit | Attending: Radiation Oncology | Admitting: Radiation Oncology

## 2016-02-05 ENCOUNTER — Encounter: Payer: Self-pay | Admitting: Radiation Oncology

## 2016-02-05 VITALS — BP 133/83 | HR 94 | Temp 98.3°F | Ht 67.0 in | Wt 144.8 lb

## 2016-02-05 DIAGNOSIS — C3491 Malignant neoplasm of unspecified part of right bronchus or lung: Secondary | ICD-10-CM

## 2016-02-05 DIAGNOSIS — Z510 Encounter for antineoplastic radiation therapy: Secondary | ICD-10-CM | POA: Diagnosis not present

## 2016-02-05 NOTE — Progress Notes (Signed)
Courtney Zuniga has completed 11 fractions to her right lung.  She reports having severe burning in her central chest over the weekend.  She started taking carafate yesterday and the burning is better.  She denies having shortness of breath except with activity.  She reports having an occasional dry cough.  She denies having hemoptysis.  She denies having fatigue and continues to drink 4 ensure per day.  She had chemotherapy yesterday.  She reports feeling shaky since she quit smoking 3 weeks ago.  The skin on her chest is intact.  She is using sonafine.  BP 133/83 (BP Location: Right Arm, Patient Position: Sitting)   Pulse 94   Temp 98.3 F (36.8 C) (Oral)   Ht '5\' 7"'$  (1.702 m)   Wt 144 lb 12.8 oz (65.7 kg)   SpO2 100%   BMI 22.68 kg/m    Wt Readings from Last 3 Encounters:  02/05/16 144 lb 12.8 oz (65.7 kg)  01/29/16 145 lb 6.4 oz (66 kg)  01/28/16 143 lb 1.6 oz (64.9 kg)

## 2016-02-05 NOTE — Progress Notes (Signed)
  Radiation Oncology         [phone] ________________________________  Name: Courtney Zuniga MRN: [id number]  Date: 02/05/2016  DOB: 1950/04/26  Weekly Radiation Therapy Management      ICD-9-CM ICD-10-CM   1. Adenocarcinoma of right lung, stage 3 (HCC) 162.9 C34.91      Current Dose: 22 Gy     Planned Dose:  60 Gy  Narrative . . . . . . . . The patient presents for routine under treatment assessment.                                                                  Set-up films were reviewed. Courtney Zuniga has completed 11 fractions to her right lung.  She reports having severe burning in her central chest over the weekend.  She started taking carafate yesterday and the burning is better.  She denies having shortness of breath except with activity.  She reports having an occasional dry cough.  She denies having hemoptysis.  She denies having fatigue and continues to drink 4 ensure per day.  She had chemotherapy yesterday.  She reports feeling shaky since she quit smoking 3 weeks ago.  The skin on her chest is intact.  She is using sonafine. The patient reported some pain in her right arm. The patient denies having any issues with breathing.    .                                   The chart was checked. Physical Findings. . .  height is '5\' 7"'$  (1.702 m) and weight is 144 lb 12.8 oz (65.7 kg). Her oral temperature is 98.3 F (36.8 C). Her blood pressure is 133/83 and her pulse is 94. Her oxygen saturation is 100%. . Weight essentially stable.  No significant changes.Lungs are clear to auscultation bilaterally. Heart has regular rate and rhythm. No palpable cervical, supraclavicular, or axillary adenopathy. Abdomen soft, non-tender, normal bowel sounds. Patient has mild expiratory wheezing on the left side.  Oral cavity clear, no secondary infection.   Impression . . . . . . . The patient is tolerating radiation. Plan . . . . . . . . . . . . Continue treatment as planned. I informed the  patient that if her pain continued to ensue I would prescribe pain medication for this.   ________________________________   Blair Promise, PhD, MD  This document serves as a record of services personally performed by Gery Pray, MD. It was created on his behalf by Truddie Hidden, a trained medical scribe. The creation of this record is based on the scribe's personal observations and the provider's statements to them. This document has been checked and approved by the attending provider.

## 2016-02-06 ENCOUNTER — Ambulatory Visit
Admission: RE | Admit: 2016-02-06 | Discharge: 2016-02-06 | Disposition: A | Discharge status: Home / Self Care | Payer: Medicare HMO | Source: Ambulatory Visit | Attending: Radiation Oncology | Admitting: Radiation Oncology

## 2016-02-06 DIAGNOSIS — Z510 Encounter for antineoplastic radiation therapy: Secondary | ICD-10-CM | POA: Diagnosis not present

## 2016-02-07 ENCOUNTER — Ambulatory Visit
Admission: RE | Admit: 2016-02-07 | Discharge: 2016-02-07 | Disposition: A | Discharge status: Home / Self Care | Payer: Medicare HMO | Source: Ambulatory Visit | Attending: Radiation Oncology | Admitting: Radiation Oncology

## 2016-02-07 DIAGNOSIS — Z510 Encounter for antineoplastic radiation therapy: Secondary | ICD-10-CM | POA: Diagnosis not present

## 2016-02-08 ENCOUNTER — Ambulatory Visit
Admission: RE | Admit: 2016-02-08 | Discharge: 2016-02-08 | Disposition: A | Discharge status: Home / Self Care | Payer: Medicare HMO | Source: Ambulatory Visit | Attending: Radiation Oncology | Admitting: Radiation Oncology

## 2016-02-08 DIAGNOSIS — Z510 Encounter for antineoplastic radiation therapy: Secondary | ICD-10-CM | POA: Diagnosis not present

## 2016-02-11 ENCOUNTER — Ambulatory Visit (HOSPITAL_BASED_OUTPATIENT_CLINIC_OR_DEPARTMENT_OTHER): Payer: Medicare HMO | Admitting: Internal Medicine

## 2016-02-11 ENCOUNTER — Other Ambulatory Visit (HOSPITAL_BASED_OUTPATIENT_CLINIC_OR_DEPARTMENT_OTHER): Payer: Medicare HMO

## 2016-02-11 ENCOUNTER — Encounter: Payer: Self-pay | Admitting: Internal Medicine

## 2016-02-11 ENCOUNTER — Ambulatory Visit (HOSPITAL_BASED_OUTPATIENT_CLINIC_OR_DEPARTMENT_OTHER): Payer: Medicare HMO

## 2016-02-11 ENCOUNTER — Ambulatory Visit
Admission: RE | Admit: 2016-02-11 | Discharge: 2016-02-11 | Disposition: A | Discharge status: Home / Self Care | Payer: Medicare HMO | Source: Ambulatory Visit | Attending: Radiation Oncology | Admitting: Radiation Oncology

## 2016-02-11 VITALS — HR 91

## 2016-02-11 VITALS — BP 92/60 | HR 106 | Temp 98.4°F | Resp 17 | Ht 67.0 in | Wt 142.0 lb

## 2016-02-11 DIAGNOSIS — Z5111 Encounter for antineoplastic chemotherapy: Secondary | ICD-10-CM

## 2016-02-11 DIAGNOSIS — C342 Malignant neoplasm of middle lobe, bronchus or lung: Secondary | ICD-10-CM | POA: Diagnosis not present

## 2016-02-11 DIAGNOSIS — C3491 Malignant neoplasm of unspecified part of right bronchus or lung: Secondary | ICD-10-CM

## 2016-02-11 DIAGNOSIS — R1310 Dysphagia, unspecified: Secondary | ICD-10-CM | POA: Diagnosis not present

## 2016-02-11 DIAGNOSIS — Z510 Encounter for antineoplastic radiation therapy: Secondary | ICD-10-CM | POA: Diagnosis not present

## 2016-02-11 LAB — COMPREHENSIVE METABOLIC PANEL
ALT: 13 U/L (ref 0–55)
AST: 17 U/L (ref 5–34)
Albumin: 3.4 g/dL — ABNORMAL LOW (ref 3.5–5.0)
Alkaline Phosphatase: 177 U/L — ABNORMAL HIGH (ref 40–150)
Anion Gap: 9 mEq/L (ref 3–11)
BUN: 19.5 mg/dL (ref 7.0–26.0)
CHLORIDE: 102 meq/L (ref 98–109)
CO2: 25 meq/L (ref 22–29)
Calcium: 9.5 mg/dL (ref 8.4–10.4)
Creatinine: 0.8 mg/dL (ref 0.6–1.1)
EGFR: 90 mL/min/{1.73_m2} — ABNORMAL LOW (ref >90)
GLUCOSE: 90 mg/dL (ref 70–140)
POTASSIUM: 4.7 meq/L (ref 3.5–5.1)
SODIUM: 136 meq/L (ref 136–145)
Total Bilirubin: 0.29 mg/dL (ref 0.20–1.20)
Total Protein: 6.7 g/dL (ref 6.4–8.3)

## 2016-02-11 LAB — CBC WITH DIFFERENTIAL/PLATELET
BASO%: 0.2 % (ref 0.0–2.0)
BASOS ABS: 0 10*3/uL (ref 0.0–0.1)
EOS%: 0.6 % (ref 0.0–7.0)
Eosinophils Absolute: 0 10*3/uL (ref 0.0–0.5)
HCT: 36.6 % (ref 34.8–46.6)
HGB: 13 g/dL (ref 11.6–15.9)
LYMPH%: 15.7 % (ref 14.0–49.7)
MCH: 29.3 pg (ref 25.1–34.0)
MCHC: 35.5 g/dL (ref 31.5–36.0)
MCV: 82.6 fL (ref 79.5–101.0)
MONO#: 0.4 10*3/uL (ref 0.1–0.9)
MONO%: 8.2 % (ref 0.0–14.0)
NEUT#: 3.5 10*3/uL (ref 1.5–6.5)
NEUT%: 75.3 % (ref 38.4–76.8)
Platelets: 236 10*3/uL (ref 145–400)
RBC: 4.43 10*6/uL (ref 3.70–5.45)
RDW: 14.5 % (ref 11.2–14.5)
WBC: 4.7 10*3/uL (ref 3.9–10.3)
lymph#: 0.7 10*3/uL — ABNORMAL LOW (ref 0.9–3.3)

## 2016-02-11 MED ORDER — DIPHENHYDRAMINE HCL 50 MG/ML IJ SOLN
INTRAMUSCULAR | Status: AC
  Filled 2016-02-11: qty 1

## 2016-02-11 MED ORDER — FAMOTIDINE IN NACL 20-0.9 MG/50ML-% IV SOLN
20.0000 mg | Freq: Once | INTRAVENOUS | Status: AC
  Administered 2016-02-11: 20 mg via INTRAVENOUS

## 2016-02-11 MED ORDER — PACLITAXEL CHEMO INJECTION 300 MG/50ML
45.0000 mg/m2 | Freq: Once | INTRAVENOUS | Status: AC
  Administered 2016-02-11: 78 mg via INTRAVENOUS
  Filled 2016-02-11: qty 13

## 2016-02-11 MED ORDER — SODIUM CHLORIDE 0.9 % IV SOLN
Freq: Once | INTRAVENOUS | Status: AC
  Administered 2016-02-11: 12:00:00 via INTRAVENOUS

## 2016-02-11 MED ORDER — SODIUM CHLORIDE 0.9 % IV SOLN
159.6000 mg | Freq: Once | INTRAVENOUS | Status: AC
  Administered 2016-02-11: 160 mg via INTRAVENOUS
  Filled 2016-02-11: qty 16

## 2016-02-11 MED ORDER — HYDROCODONE-ACETAMINOPHEN 7.5-325 MG/15ML PO SOLN: 15.0000 mL | Freq: Four times a day (QID) | ORAL | 0 refills | Status: DC | PRN

## 2016-02-11 MED ORDER — SODIUM CHLORIDE 0.9 % IV SOLN
20.0000 mg | Freq: Once | INTRAVENOUS | Status: AC
  Administered 2016-02-11: 20 mg via INTRAVENOUS
  Filled 2016-02-11: qty 2

## 2016-02-11 MED ORDER — DIPHENHYDRAMINE HCL 50 MG/ML IJ SOLN
50.0000 mg | Freq: Once | INTRAMUSCULAR | Status: AC
  Administered 2016-02-11: 50 mg via INTRAVENOUS

## 2016-02-11 MED ORDER — FAMOTIDINE IN NACL 20-0.9 MG/50ML-% IV SOLN
INTRAVENOUS | Status: AC
  Filled 2016-02-11: qty 50

## 2016-02-11 MED ORDER — PALONOSETRON HCL INJECTION 0.25 MG/5ML
0.2500 mg | Freq: Once | INTRAVENOUS | Status: AC
  Administered 2016-02-11: 0.25 mg via INTRAVENOUS

## 2016-02-11 MED ORDER — PALONOSETRON HCL INJECTION 0.25 MG/5ML
INTRAVENOUS | Status: AC
  Filled 2016-02-11: qty 5

## 2016-02-11 NOTE — Patient Instructions (Signed)
[facility] Discharge Instructions for Patients Receiving Chemotherapy  Today you received the following chemotherapy agents Taxol/Carboplatin  To help prevent nausea and vomiting after your treatment, we encourage you to take your nausea medication    If you develop nausea and vomiting that is not controlled by your nausea medication, call the clinic.   BELOW ARE SYMPTOMS THAT SHOULD BE REPORTED IMMEDIATELY:  *FEVER GREATER THAN 100.5 F  *CHILLS WITH OR WITHOUT FEVER  NAUSEA AND VOMITING THAT IS NOT CONTROLLED WITH YOUR NAUSEA MEDICATION  *UNUSUAL SHORTNESS OF BREATH  *UNUSUAL BRUISING OR BLEEDING  TENDERNESS IN MOUTH AND THROAT WITH OR WITHOUT PRESENCE OF ULCERS  *URINARY PROBLEMS  *BOWEL PROBLEMS  UNUSUAL RASH Items with * indicate a potential emergency and should be followed up as soon as possible.  Feel free to call the clinic you have any questions or concerns. The clinic phone number is [phone].  Please show the CHEMO ALERT CARD at check-in to the Emergency Department and triage nurse.   

## 2016-02-11 NOTE — Progress Notes (Signed)
[facility] [phone]   [phone]  OFFICE PROGRESS NOTE  Barry Dienes, NP [place] [address]  DIAGNOSIS: Stage IIIA (T1b, N2, M0) non-small cell lung cancer, adenocarcinoma presented with right middle lobe lung nodule in addition to mediastinal lymphadenopathy. The patient also has a highly suspicious small nodule in the left upper lobe that could change her disease to a stage IV or at least another synchronous primary lesion in the left upper lobe.  PRIOR THERAPY: None.  CURRENT THERAPY: A course of concurrent chemoradiation with weekly carboplatin for AUC of 2 and paclitaxel 45 MG/M2. Status post 3 cycles.  INTERVAL HISTORY: Courtney Zuniga 65 y.o. female returns to the clinic today for follow-up visit. The patient tolerated the first 3 weeks of her treatment fairly well with no significant adverse effects except for odynophagia. She was started on Carafate by Dr. Sondra Come with mild improvement in her condition. She lost 3 pounds since her last visit. She denied having any significant chest pain, shortness of breath, cough or hemoptysis. She is here today to start cycle #4 of her treatment.  MEDICAL HISTORY: Past Medical History:  Diagnosis Date  . Anemia    as a young woman  . Arthritis    osteoartritis  . Asthma   . Brain tumor (benign) ([place]) 2005 [facility]   Benign  . Chronic headaches   . Chronic hip pain   . Chronic pain   . COPD (chronic obstructive pulmonary disease) ([place])   . Coronary artery disease   . Depression   . Encounter for antineoplastic chemotherapy 01/10/2016  . Hypertension   . NSTEMI (non-ST elevated myocardial infarction) ([place])   . On home O2    qhs  . Pneumonia   . Shortness of breath dyspnea     ALLERGIES:  is allergic to no known allergies.  MEDICATIONS:  Current Outpatient Prescriptions  Medication Sig Dispense Refill  . albuterol (PROVENTIL HFA;VENTOLIN HFA) 108 (90 BASE) MCG/ACT inhaler  Inhale 2 puffs into the lungs every 6 (six) hours as needed for shortness of breath.     Marland Kitchen albuterol (PROVENTIL) (2.5 MG/3ML) 0.083% nebulizer solution Take 2.5 mg by nebulization every 6 (six) hours as needed for wheezing or shortness of breath.    . Aspirin-Salicylamide-Caffeine (BC HEADACHE POWDER PO) Take 1 packet by mouth daily as needed (headache).    . calcium-vitamin D 250-100 MG-UNIT per tablet Take 1 tablet by mouth daily.    . Cholecalciferol (VITAMIN D3) 5000 units TABS Take 1 tablet by mouth daily.    . Fluticasone-Salmeterol (ADVAIR) 500-50 MCG/DOSE AEPB Inhale 1 puff into the lungs every 12 (twelve) hours.    Marland Kitchen lisinopril (PRINIVIL,ZESTRIL) 40 MG tablet Take 40 mg by mouth daily.    . Nutritional Supplements (ENSURE PLUS HN) LIQD Take 4 Cans by mouth daily. 120 Can 0  . pantoprazole (PROTONIX) 40 MG tablet Take 40 mg by mouth daily.    Marland Kitchen QVAR 80 MCG/ACT inhaler Inhale 1 puff into the lungs 2 (two) times daily as needed.     . sucralfate (CARAFATE) 1 g tablet     . Tiotropium Bromide-Olodaterol (STIOLTO RESPIMAT) 2.5-2.5 MCG/ACT AERS Inhale 1 puff into the lungs daily.    . Wound Dressings (SONAFINE) Apply 1 application topically 2 (two) times daily.    Marland Kitchen HYDROcodone-acetaminophen (NORCO/VICODIN) 5-325 MG tablet Take 1 tablet by mouth every 6 (six) hours as needed. (Patient not taking: Reported on 02/11/2016) 20 tablet 0  .  nitroGLYCERIN (NITROSTAT) 0.4 MG SL tablet Place 0.4 mg under the tongue every 5 (five) minutes as needed for chest pain.    Marland Kitchen prochlorperazine (COMPAZINE) 10 MG tablet Take 1 tablet (10 mg total) by mouth every 6 (six) hours as needed for nausea or vomiting. (Patient not taking: Reported on 02/11/2016) 30 tablet 0   No current facility-administered medications for this visit.     SURGICAL HISTORY:  Past Surgical History:  Procedure Laterality Date  . CHOLECYSTECTOMY    . COLONOSCOPY  2015   Results requested from [facility]  . COLONOSCOPY    .  ESOPHAGOGASTRODUODENOSCOPY N/A 08/14/2015   Procedure: ESOPHAGOGASTRODUODENOSCOPY (EGD);  Surgeon: Daneil Dolin, MD;  [facility];  Service: Endoscopy;  Laterality: N/A;  215   . ESOPHAGOGASTRODUODENOSCOPY (EGD) WITH PROPOFOL N/A 09/13/2015   Procedure: ESOPHAGOGASTRODUODENOSCOPY (EGD) WITH PROPOFOL;  Surgeon: Milus Banister, MD;  Location: [facility] END[facility];  Service: Endoscopy;  Laterality: N/A;  . TUMOR REMOVAL  2005   Benign  . UPPER ESOPHAGEAL ENDOSCOPIC ULTRASOUND (EUS)  09/13/2015   Procedure: UPPER ESOPHAGEAL ENDOSCOPIC ULTRASOUND (EUS);  Surgeon: Milus Banister, MD;  Location: [facility];  Service: Endoscopy;;  . VIDEO BRONCHOSCOPY WITH ENDOBRONCHIAL NAVIGATION N/A 12/31/2015   Procedure: VIDEO BRONCHOSCOPY WITH ENDOBRONCHIAL NAVIGATION;  Surgeon: Melrose Nakayama, MD;  Location: [facility];  Service: Thoracic;  Laterality: N/A;  . VIDEO BRONCHOSCOPY WITH ENDOBRONCHIAL ULTRASOUND N/A 11/08/2015   Procedure: VIDEO BRONCHOSCOPY WITH ENDOBRONCHIAL ULTRASOUND;  Surgeon: Ivin Poot, MD;  Location: [facility];  Service: Thoracic;  Laterality: N/A;    REVIEW OF SYSTEMS:  A comprehensive review of systems was negative except for: Gastrointestinal: positive for odynophagia   PHYSICAL EXAMINATION: General appearance: alert, cooperative and no distress Head: Normocephalic, without obvious abnormality, atraumatic Neck: no adenopathy, no JVD, supple, symmetrical, trachea midline and thyroid not enlarged, symmetric, no tenderness/mass/nodules Lymph nodes: Cervical, supraclavicular, and axillary nodes normal. Resp: clear to auscultation bilaterally Back: symmetric, no curvature. ROM normal. No CVA tenderness. Cardio: regular rate and rhythm, S1, S2 normal, no murmur, click, rub or gallop GI: soft, non-tender; bowel sounds normal; no masses,  no organomegaly Extremities: extremities normal, atraumatic, no cyanosis or edema Neurologic: Alert and oriented X 3, normal strength and tone. Normal  symmetric reflexes. Normal coordination and gait  ECOG PERFORMANCE STATUS: 1 - Symptomatic but completely ambulatory  Blood pressure 92/60, pulse (!) 106, temperature 98.4 F (36.9 C), temperature source Oral, resp. rate 17, height '5\' 7"'$  (1.702 m), weight 142 lb (64.4 kg), SpO2 98 %.  LABORATORY DATA: Lab Results  Component Value Date   WBC 4.7 02/11/2016   HGB 13.0 02/11/2016   HCT 36.6 02/11/2016   MCV 82.6 02/11/2016   PLT 236 02/11/2016      Chemistry      Component Value Date/Time   NA 136 02/11/2016 0928   K 4.7 02/11/2016 0928   CL 110 12/31/2015 0941   CO2 25 02/11/2016 0928   BUN 19.5 02/11/2016 0928   CREATININE 0.8 02/11/2016 0928      Component Value Date/Time   CALCIUM 9.5 02/11/2016 0928   ALKPHOS 177 (H) 02/11/2016 0928   AST 17 02/11/2016 0928   ALT 13 02/11/2016 0928   BILITOT 0.29 02/11/2016 0928       RADIOGRAPHIC STUDIES: Mr Jeri Cos FT Contrast  Result Date: 01/25/2016 CLINICAL DATA:  Right lung adenocarcinoma. Evaluation for intracranial metastatic disease. EXAM: MRI HEAD WITHOUT AND WITH CONTRAST TECHNIQUE: Multiplanar, multiecho pulse sequences  of the brain and surrounding structures were obtained without and with intravenous contrast. CONTRAST:  14m MULTIHANCE GADOBENATE DIMEGLUMINE 529 MG/ML IV SOLN COMPARISON:  Brain MR 03/31/2014 FINDINGS: Brain: No acute infarct or intraparenchymal hemorrhage. The midline structures are normal. There is multifocal hyperintense T2-weighted signal within the periventricular, deep and subcortical white matter, most often seen in the setting of chronic microvascular ischemia. No mass lesion or midline shift. No hydrocephalus or extra-axial fluid collection. No contrast-enhancing lesions. Vascular: Major intracranial arterial and venous sinus flow voids are preserved. No evidence of chronic microhemorrhage or amyloid angiopathy. Skull and upper cervical spine: Low T1 weighted, expansile lesion of the frontal calvarium  is unchanged. Post surgical changes of prior transsphenoidal resection. Sinuses/Orbits: Bilateral mastoid effusions are unchanged. Normal orbits. IMPRESSION: 1. No intracranial metastatic disease. 2. Unchanged expansile lesion of the frontal calvarium. 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 Electronically Signed   By: KUlyses JarredM.D.   On: 01/25/2016 17:33    ASSESSMENT AND PLAN: This is a very pleasant 65years old African-American female with stage IIIA non-small cell lung cancer. She is currently undergoing a course of concurrent chemoradiation with weekly carboplatin and paclitaxel is status post 3 cycles. She tolerated the first 3 cycles of her treatment well with no significant adverse effects except for odynophagia. I recommended for the patient to proceed with cycle #4 of her concurrent chemoradiation today as a scheduled. She will come back for follow-up visit in 2 weeks for evaluation before starting cycle #6. For the odynophagia, advised the patient to continue on Carafate and I started her on Hycet 7.5/325 mg liquid by mouth Q4 hours as needed for pain. She was advised to call immediately if she has any concerning symptoms in the interval. The patient voices understanding of current disease status and treatment options and is in agreement with the current care plan.  All questions were answered. The patient knows to call the clinic with any problems, questions or concerns. We can certainly see the patient much sooner if necessary.  Disclaimer: This note was dictated with voice recognition software. Similar sounding words can inadvertently be transcribed and may not be corrected upon review.

## 2016-02-12 ENCOUNTER — Ambulatory Visit
Admission: RE | Admit: 2016-02-12 | Discharge: 2016-02-12 | Disposition: A | Discharge status: Home / Self Care | Payer: Medicare HMO | Source: Ambulatory Visit | Attending: Radiation Oncology | Admitting: Radiation Oncology

## 2016-02-12 ENCOUNTER — Encounter: Payer: Self-pay | Admitting: Radiation Oncology

## 2016-02-12 VITALS — BP 112/83 | HR 102 | Temp 98.1°F | Ht 67.0 in | Wt 146.2 lb

## 2016-02-12 DIAGNOSIS — Z510 Encounter for antineoplastic radiation therapy: Secondary | ICD-10-CM | POA: Diagnosis not present

## 2016-02-12 DIAGNOSIS — C3491 Malignant neoplasm of unspecified part of right bronchus or lung: Secondary | ICD-10-CM

## 2016-02-12 NOTE — Progress Notes (Addendum)
Courtney Zuniga has completed 15 fractions to her right lung.  She reports having burning in her central chest that she is rating at an 8/10.  She reports that she is having trouble eating anything because food feels like it is stuck and then when it goes down it burns.  She is taking carafate 4 times a day and was given a prescription for hycet by Dr. Julien Nordmann that she will pick up today.  She reports her breathing is good until she feels the burning in her chest.  She reports having a dry cough.  She reports having fatigue.  She had chemotherapy yesterday.  The skin on her right chest and back is intact.  She is using sonafine.  She is wondering if she can have some help at home with cleaning and bathing.  BP 112/83 (BP Location: Right Arm, Patient Position: Sitting)   Pulse (!) 102   Temp 98.1 F (36.7 C) (Oral)   Ht '5\' 7"'$  (1.702 m)   Wt 146 lb 3.2 oz (66.3 kg)   SpO2 100%   BMI 22.90 kg/m    Wt Readings from Last 3 Encounters:  02/12/16 146 lb 3.2 oz (66.3 kg)  02/11/16 142 lb (64.4 kg)  02/05/16 144 lb 12.8 oz (65.7 kg)

## 2016-02-12 NOTE — Addendum Note (Signed)
Encounter addended by: Gery Pray, MD on: 02/12/2016  9:32 AM<BR>    Actions taken: Sign clinical note

## 2016-02-12 NOTE — Progress Notes (Addendum)
  Radiation Oncology         [phone] ________________________________  Name: Courtney Zuniga MRN: [id number]  Date: 02/12/2016  DOB: 1951-02-20  Weekly Radiation Therapy Management      ICD-9-CM ICD-10-CM   1. Adenocarcinoma of right lung, stage 3 (HCC) 162.9 C34.91 Ambulatory referral to Home Health     Current Dose: 32 Gy     Planned Dose:  60 Gy  Narrative . . . . . . . . The patient presents for routine under treatment assessment.                                                                  Set-up films were reviewed. Courtney Zuniga has completed 15 fractions to her right lung.  She reports having severe burning in her central chest that she rates an 8/10. She reports she is having difficulty eating anything because the food feels like it gets stuck. When the food does go down, it burns. She is taking Carafate 4 times a day and was given a prescription for Hycet by Dr. Julien Nordmann that she will pick up today.                                    The chart was checked. Physical Findings. . .  height is '5\' 7"'$  (1.702 m) and weight is 146 lb 3.2 oz (66.3 kg). Her oral temperature is 98.1 F (36.7 C). Her blood pressure is 112/83 and her pulse is 102 (abnormal). Her oxygen saturation is 100%. . Weight essentially stable.  No significant changes.Lungs are clear to auscultation bilaterally. Heart has regular rate and rhythm. No palpable cervical, supraclavicular, or axillary adenopathy. Abdomen soft, non-tender, normal bowel sounds. Oral cavity clear, no secondary infection.   Impression . . . . . . . The patient is tolerating radiation. Plan . . . . . . . . . . . . Continue treatment as planned. Advised patient to maintain a bland diet to limit esophageal sx.  Will consult nutrition.  ________________________________   Blair Promise, PhD, MD  This document serves as a record of services personally performed by Gery Pray, MD. It was created on his behalf by Bethann Humble, a trained  medical scribe. The creation of this record is based on the scribe's personal observations and the provider's statements to them. This document has been checked and approved by the attending provider.

## 2016-02-13 ENCOUNTER — Ambulatory Visit
Admission: RE | Admit: 2016-02-13 | Discharge: 2016-02-13 | Disposition: A | Discharge status: Home / Self Care | Payer: Medicare HMO | Source: Ambulatory Visit | Attending: Radiation Oncology | Admitting: Radiation Oncology

## 2016-02-13 DIAGNOSIS — Z510 Encounter for antineoplastic radiation therapy: Secondary | ICD-10-CM | POA: Diagnosis not present

## 2016-02-14 ENCOUNTER — Ambulatory Visit
Admission: RE | Admit: 2016-02-14 | Discharge: 2016-02-14 | Disposition: A | Discharge status: Home / Self Care | Payer: Medicare HMO | Source: Ambulatory Visit | Attending: Radiation Oncology | Admitting: Radiation Oncology

## 2016-02-14 DIAGNOSIS — Z510 Encounter for antineoplastic radiation therapy: Secondary | ICD-10-CM | POA: Diagnosis not present

## 2016-02-15 ENCOUNTER — Ambulatory Visit
Admission: RE | Admit: 2016-02-15 | Discharge: 2016-02-15 | Disposition: A | Discharge status: Home / Self Care | Payer: Medicare HMO | Source: Ambulatory Visit | Attending: Radiation Oncology | Admitting: Radiation Oncology

## 2016-02-15 DIAGNOSIS — Z510 Encounter for antineoplastic radiation therapy: Secondary | ICD-10-CM | POA: Diagnosis not present

## 2016-02-18 ENCOUNTER — Ambulatory Visit (HOSPITAL_BASED_OUTPATIENT_CLINIC_OR_DEPARTMENT_OTHER): Payer: Medicare HMO

## 2016-02-18 ENCOUNTER — Other Ambulatory Visit (HOSPITAL_BASED_OUTPATIENT_CLINIC_OR_DEPARTMENT_OTHER): Payer: Medicare HMO

## 2016-02-18 ENCOUNTER — Telehealth: Payer: Self-pay | Admitting: Medical Oncology

## 2016-02-18 ENCOUNTER — Ambulatory Visit
Admission: RE | Admit: 2016-02-18 | Discharge: 2016-02-18 | Disposition: A | Discharge status: Home / Self Care | Payer: Medicare HMO | Source: Ambulatory Visit | Attending: Radiation Oncology | Admitting: Radiation Oncology

## 2016-02-18 ENCOUNTER — Encounter: Payer: Self-pay | Admitting: *Deleted

## 2016-02-18 VITALS — BP 101/57 | HR 98 | Temp 98.0°F

## 2016-02-18 DIAGNOSIS — C3491 Malignant neoplasm of unspecified part of right bronchus or lung: Secondary | ICD-10-CM

## 2016-02-18 DIAGNOSIS — C342 Malignant neoplasm of middle lobe, bronchus or lung: Secondary | ICD-10-CM | POA: Diagnosis not present

## 2016-02-18 DIAGNOSIS — Z5111 Encounter for antineoplastic chemotherapy: Secondary | ICD-10-CM

## 2016-02-18 DIAGNOSIS — Z510 Encounter for antineoplastic radiation therapy: Secondary | ICD-10-CM | POA: Diagnosis not present

## 2016-02-18 LAB — CBC WITH DIFFERENTIAL/PLATELET
BASO%: 0.4 % (ref 0.0–2.0)
Basophils Absolute: 0 10*3/uL (ref 0.0–0.1)
EOS ABS: 0 10*3/uL (ref 0.0–0.5)
EOS%: 0.9 % (ref 0.0–7.0)
HCT: 35.4 % (ref 34.8–46.6)
HEMOGLOBIN: 12.1 g/dL (ref 11.6–15.9)
LYMPH%: 11.8 % — AB (ref 14.0–49.7)
MCH: 29.3 pg (ref 25.1–34.0)
MCHC: 34.1 g/dL (ref 31.5–36.0)
MCV: 86.1 fL (ref 79.5–101.0)
MONO#: 0.4 10*3/uL (ref 0.1–0.9)
MONO%: 7.8 % (ref 0.0–14.0)
NEUT%: 79.1 % — ABNORMAL HIGH (ref 38.4–76.8)
NEUTROS ABS: 3.6 10*3/uL (ref 1.5–6.5)
PLATELETS: 206 10*3/uL (ref 145–400)
RBC: 4.11 10*6/uL (ref 3.70–5.45)
RDW: 14.7 % — AB (ref 11.2–14.5)
WBC: 4.5 10*3/uL (ref 3.9–10.3)
lymph#: 0.5 10*3/uL — ABNORMAL LOW (ref 0.9–3.3)

## 2016-02-18 LAB — COMPREHENSIVE METABOLIC PANEL
ALBUMIN: 3.2 g/dL — AB (ref 3.5–5.0)
ALK PHOS: 140 U/L (ref 40–150)
ALT: 10 U/L (ref 0–55)
ANION GAP: 9 meq/L (ref 3–11)
AST: 14 U/L (ref 5–34)
BUN: 12 mg/dL (ref 7.0–26.0)
CO2: 24 meq/L (ref 22–29)
CREATININE: 0.7 mg/dL (ref 0.6–1.1)
Calcium: 9.2 mg/dL (ref 8.4–10.4)
Chloride: 105 mEq/L (ref 98–109)
EGFR: >90 mL/min/{1.73_m2} (ref >90)
GLUCOSE: 67 mg/dL — AB (ref 70–140)
Potassium: 3.8 mEq/L (ref 3.5–5.1)
SODIUM: 138 meq/L (ref 136–145)
TOTAL PROTEIN: 6.3 g/dL — AB (ref 6.4–8.3)

## 2016-02-18 MED ORDER — SODIUM CHLORIDE 0.9 % IV SOLN
Freq: Once | INTRAVENOUS | Status: AC
Start: 2016-02-18 — End: 2016-02-18
  Administered 2016-02-18: 11:00:00 via INTRAVENOUS

## 2016-02-18 MED ORDER — PALONOSETRON HCL INJECTION 0.25 MG/5ML
INTRAVENOUS | Status: AC
  Filled 2016-02-18: qty 5

## 2016-02-18 MED ORDER — DEXTROSE 5 % IV SOLN
45.0000 mg/m2 | Freq: Once | INTRAVENOUS | Status: AC
  Administered 2016-02-18: 78 mg via INTRAVENOUS
  Filled 2016-02-18: qty 13

## 2016-02-18 MED ORDER — DIPHENHYDRAMINE HCL 50 MG/ML IJ SOLN
INTRAMUSCULAR | Status: AC
  Filled 2016-02-18: qty 1

## 2016-02-18 MED ORDER — SODIUM CHLORIDE 0.9 % IV SOLN
Freq: Once | INTRAVENOUS | Status: AC
  Administered 2016-02-18: 12:00:00 via INTRAVENOUS

## 2016-02-18 MED ORDER — FAMOTIDINE IN NACL 20-0.9 MG/50ML-% IV SOLN
INTRAVENOUS | Status: AC
  Filled 2016-02-18: qty 50

## 2016-02-18 MED ORDER — SODIUM CHLORIDE 0.9 % IV SOLN
159.6000 mg | Freq: Once | INTRAVENOUS | Status: AC
  Administered 2016-02-18: 160 mg via INTRAVENOUS
  Filled 2016-02-18: qty 16

## 2016-02-18 MED ORDER — FAMOTIDINE IN NACL 20-0.9 MG/50ML-% IV SOLN
20.0000 mg | Freq: Once | INTRAVENOUS | Status: AC
  Administered 2016-02-18: 20 mg via INTRAVENOUS

## 2016-02-18 MED ORDER — SODIUM CHLORIDE 0.9 % IV SOLN
20.0000 mg | Freq: Once | INTRAVENOUS | Status: AC
  Administered 2016-02-18: 20 mg via INTRAVENOUS
  Filled 2016-02-18: qty 2

## 2016-02-18 MED ORDER — PALONOSETRON HCL INJECTION 0.25 MG/5ML
0.2500 mg | Freq: Once | INTRAVENOUS | Status: AC
  Administered 2016-02-18: 0.25 mg via INTRAVENOUS

## 2016-02-18 MED ORDER — DIPHENHYDRAMINE HCL 50 MG/ML IJ SOLN
50.0000 mg | Freq: Once | INTRAMUSCULAR | Status: AC
  Administered 2016-02-18: 50 mg via INTRAVENOUS

## 2016-02-18 NOTE — Progress Notes (Signed)
Ok to treat with HR and BP today per MD Julien Nordmann. 500 NS added to tx per MD [facility].

## 2016-02-18 NOTE — Telephone Encounter (Signed)
Length of chemo today. Information given.

## 2016-02-18 NOTE — Progress Notes (Signed)
Oncology Nurse Navigator Documentation  Oncology Nurse Navigator Flowsheets 02/18/2016  Navigator Location [facility]  Navigator Encounter Type Treatment/I spoke with Courtney Zuniga today.  I followed up with her and her transportation issues.  I stated I found a resource for her and if it was ok to completed the application with her.  She was ok with that.  We completed application together.  She will bring in her monthly income information tomorrow and I will fax that and application.   Patient Visit Type MedOnc  Treatment Phase Treatment  Barriers/Navigation Needs Financial  Interventions Other  Acuity Level 2  Acuity Level 2 Other  Time Spent with Patient 62

## 2016-02-18 NOTE — Patient Instructions (Signed)
[facility] Discharge Instructions for Patients Receiving Chemotherapy  Today you received the following chemotherapy agents Taxol and Carboplatin  To help prevent nausea and vomiting after your treatment, we encourage you to take your nausea medication as directed. No Zofran for 3 days. Take Compazine instead.   If you develop nausea and vomiting that is not controlled by your nausea medication, call the clinic.   BELOW ARE SYMPTOMS THAT SHOULD BE REPORTED IMMEDIATELY:  *FEVER GREATER THAN 100.5 F  *CHILLS WITH OR WITHOUT FEVER  NAUSEA AND VOMITING THAT IS NOT CONTROLLED WITH YOUR NAUSEA MEDICATION  *UNUSUAL SHORTNESS OF BREATH  *UNUSUAL BRUISING OR BLEEDING  TENDERNESS IN MOUTH AND THROAT WITH OR WITHOUT PRESENCE OF ULCERS  *URINARY PROBLEMS  *BOWEL PROBLEMS  UNUSUAL RASH Items with * indicate a potential emergency and should be followed up as soon as possible.  Feel free to call the clinic you have any questions or concerns. The clinic phone number is [phone].  Please show the CHEMO ALERT CARD at check-in to the Emergency Department and triage nurse.   

## 2016-02-19 ENCOUNTER — Ambulatory Visit
Admission: RE | Admit: 2016-02-19 | Discharge: 2016-02-19 | Disposition: A | Discharge status: Home / Self Care | Payer: Medicare HMO | Source: Ambulatory Visit | Attending: Radiation Oncology | Admitting: Radiation Oncology

## 2016-02-19 ENCOUNTER — Encounter: Payer: Self-pay | Admitting: Radiation Oncology

## 2016-02-19 VITALS — BP 119/80 | HR 103 | Temp 98.9°F | Ht 67.0 in | Wt 148.0 lb

## 2016-02-19 DIAGNOSIS — C3491 Malignant neoplasm of unspecified part of right bronchus or lung: Secondary | ICD-10-CM

## 2016-02-19 DIAGNOSIS — Z510 Encounter for antineoplastic radiation therapy: Secondary | ICD-10-CM | POA: Diagnosis not present

## 2016-02-19 NOTE — Progress Notes (Signed)
Ms. Wolfley is here for her 21st fraction of radiation to her Right Lung. She reports pain a 5/10 in her throat when she swallows. She is using carafate four times daily, and reports her pain when swallowing has improved slightly with the medicine. She is also taking Hycet once daily for pain relief. She reports fatigue. She received chemotherapy yesterday, and tells me they gave her extra fluids because her blood pressure was low. She did not take her blood pressure medicine this morning and her BP was 119/80. She is not eating or drinking well. It takes her a long time to eat each meal which causes her to eat less. She is drinking 4 Ensures daily. The skin to her radiation area is intact and she is using sonafine cream twice daily. She has a cough and some shortness of breath with exertion and talking. She plans to see her primary care doctor today, and questions if she should get a flu shot.  BP 119/80   Pulse (!) 103   Temp 98.9 F (37.2 C)   Ht '5\' 7"'$  (1.702 m)   Wt 148 lb (67.1 kg)   SpO2 99% Comment: room air  BMI 23.18 kg/m    Wt Readings from Last 3 Encounters:  02/19/16 148 lb (67.1 kg)  02/12/16 146 lb 3.2 oz (66.3 kg)  02/11/16 142 lb (64.4 kg)

## 2016-02-19 NOTE — Progress Notes (Signed)
  Radiation Oncology         [phone] ________________________________  Name: Courtney Zuniga MRN: [id number]  Date: 02/19/2016  DOB: 08-17-1950  Weekly Radiation Therapy Management      ICD-9-CM ICD-10-CM   1. Adenocarcinoma of right lung, stage 3 (HCC) 162.9 C34.91      Current Dose: 42 Gy     Planned Dose:  60 Gy  Narrative . . . . . . . . The patient presents for routine under treatment assessment.                             Courtney Zuniga is here for her 21st fraction of radiation to her Right Lung. She reports pain as a 5/10 in her throat when she swallows. She is using carafate four times daily and reports her pain when swallowing has improved slightly with the medicine. She is also taking Hycet once daily for pain relief. She reports fatigue. She received chemotherapy yesterday and told the nurse that they gave her extra fluids because her blood pressure was low. She did not take her blood pressure medicine this morning and her BP was 119/80. She is not eating or drinking well. It takes her a long time to eat each meal which causes her to eat less. She is drinking 4 Ensures daily. The skin to her radiation area is intact and she is using sonafine cream twice daily. She has a cough and some shortness of breath with exertion and talking.                                   Set-up films were reviewed.                                 The chart was checked. Physical Findings. . .  height is '5\' 7"'$  (1.702 m) and weight is 148 lb (67.1 kg). Her temperature is 98.9 F (37.2 C). Her blood pressure is 119/80 and her pulse is 103 (abnormal). Her oxygen saturation is 99%. . Weight essentially stable.  No significant changes.Lungs are clear to auscultation bilaterally. Heart has regular rate and rhythm. Impression . . . . . . . The patient is tolerating radiation. Plan . . . . . . . . . . . . Continue treatment as planned. ________________________________   Blair Promise, PhD, MD  This  document serves as a record of services personally performed by Gery Pray, MD. It was created on his behalf by Darcus Austin, a trained medical scribe. The creation of this record is based on the scribe's personal observations and the provider's statements to them. This document has been checked and approved by the attending provider.

## 2016-02-20 ENCOUNTER — Ambulatory Visit
Admission: RE | Admit: 2016-02-20 | Discharge: 2016-02-20 | Disposition: A | Discharge status: Home / Self Care | Payer: Medicare HMO | Source: Ambulatory Visit | Attending: Radiation Oncology | Admitting: Radiation Oncology

## 2016-02-20 DIAGNOSIS — Z510 Encounter for antineoplastic radiation therapy: Secondary | ICD-10-CM | POA: Diagnosis not present

## 2016-02-21 ENCOUNTER — Ambulatory Visit
Admission: RE | Admit: 2016-02-21 | Discharge: 2016-02-21 | Disposition: A | Discharge status: Home / Self Care | Payer: Medicare HMO | Source: Ambulatory Visit | Attending: Radiation Oncology | Admitting: Radiation Oncology

## 2016-02-21 DIAGNOSIS — Z510 Encounter for antineoplastic radiation therapy: Secondary | ICD-10-CM | POA: Diagnosis not present

## 2016-02-22 ENCOUNTER — Telehealth: Payer: Self-pay | Admitting: *Deleted

## 2016-02-22 ENCOUNTER — Ambulatory Visit
Admission: RE | Admit: 2016-02-22 | Discharge: 2016-02-22 | Disposition: A | Discharge status: Home / Self Care | Payer: Medicare HMO | Source: Ambulatory Visit | Attending: Radiation Oncology | Admitting: Radiation Oncology

## 2016-02-22 DIAGNOSIS — Z510 Encounter for antineoplastic radiation therapy: Secondary | ICD-10-CM | POA: Diagnosis not present

## 2016-02-22 NOTE — Telephone Encounter (Signed)
Oncology Nurse Navigator Documentation  Oncology Nurse Navigator Flowsheets 02/22/2016  Navigator Location [facility]  Navigator Encounter Type Treatment/I received a message from [place] regarding recent grant I applied Ms. Bouse for.  I updated her and she stated not sure if patient qualifies for that grant.  I called the program and was told Ms. [place]arico did not qualify.  I also noted that she has received [place] grant of 400.00.  I called Ms. Givan to update her on not qualifying for grant.  I spoke with her but was disconnected.  I called back 3 times but was unable to reach.    Treatment Phase Treatment  Barriers/Navigation Needs Financial  Interventions Other  Acuity Level 2  Acuity Level 2 Other  Time Spent with Patient 30

## 2016-02-22 NOTE — Telephone Encounter (Signed)
I called Courtney Zuniga back on another phone number.  I updated her on application and that she did not qualify.  She was thankful for the call back.

## 2016-02-25 ENCOUNTER — Telehealth: Payer: Self-pay | Admitting: Internal Medicine

## 2016-02-25 ENCOUNTER — Other Ambulatory Visit (HOSPITAL_BASED_OUTPATIENT_CLINIC_OR_DEPARTMENT_OTHER): Payer: Medicare HMO

## 2016-02-25 ENCOUNTER — Ambulatory Visit (HOSPITAL_BASED_OUTPATIENT_CLINIC_OR_DEPARTMENT_OTHER): Payer: Medicare HMO

## 2016-02-25 ENCOUNTER — Ambulatory Visit
Admission: RE | Admit: 2016-02-25 | Discharge: 2016-02-25 | Disposition: A | Discharge status: Home / Self Care | Payer: Medicare HMO | Source: Ambulatory Visit | Attending: Radiation Oncology | Admitting: Radiation Oncology

## 2016-02-25 ENCOUNTER — Encounter: Payer: Self-pay | Admitting: Internal Medicine

## 2016-02-25 ENCOUNTER — Ambulatory Visit (HOSPITAL_BASED_OUTPATIENT_CLINIC_OR_DEPARTMENT_OTHER): Payer: Medicare HMO | Admitting: Internal Medicine

## 2016-02-25 VITALS — BP 112/82 | HR 108 | Temp 98.2°F | Resp 17 | Ht 67.0 in | Wt 141.5 lb

## 2016-02-25 VITALS — HR 101

## 2016-02-25 DIAGNOSIS — C3491 Malignant neoplasm of unspecified part of right bronchus or lung: Secondary | ICD-10-CM

## 2016-02-25 DIAGNOSIS — C3411 Malignant neoplasm of upper lobe, right bronchus or lung: Secondary | ICD-10-CM

## 2016-02-25 DIAGNOSIS — Z5111 Encounter for antineoplastic chemotherapy: Secondary | ICD-10-CM

## 2016-02-25 DIAGNOSIS — R1310 Dysphagia, unspecified: Secondary | ICD-10-CM

## 2016-02-25 DIAGNOSIS — R52 Pain, unspecified: Secondary | ICD-10-CM

## 2016-02-25 DIAGNOSIS — C342 Malignant neoplasm of middle lobe, bronchus or lung: Secondary | ICD-10-CM | POA: Diagnosis not present

## 2016-02-25 DIAGNOSIS — Z510 Encounter for antineoplastic radiation therapy: Secondary | ICD-10-CM | POA: Diagnosis not present

## 2016-02-25 LAB — COMPREHENSIVE METABOLIC PANEL
ALBUMIN: 3.3 g/dL — AB (ref 3.5–5.0)
ALK PHOS: 153 U/L — AB (ref 40–150)
ALT: 12 U/L (ref 0–55)
ANION GAP: 11 meq/L (ref 3–11)
AST: 16 U/L (ref 5–34)
BUN: 11.7 mg/dL (ref 7.0–26.0)
CALCIUM: 9.7 mg/dL (ref 8.4–10.4)
CHLORIDE: 105 meq/L (ref 98–109)
CO2: 26 mEq/L (ref 22–29)
CREATININE: 0.7 mg/dL (ref 0.6–1.1)
EGFR: >90 mL/min/{1.73_m2} (ref >90)
Glucose: 97 mg/dl (ref 70–140)
Potassium: 4 mEq/L (ref 3.5–5.1)
Sodium: 142 mEq/L (ref 136–145)
Total Protein: 6.7 g/dL (ref 6.4–8.3)

## 2016-02-25 LAB — CBC WITH DIFFERENTIAL/PLATELET
BASO%: 0.6 % (ref 0.0–2.0)
BASOS ABS: 0 10*3/uL (ref 0.0–0.1)
EOS%: 1.8 % (ref 0.0–7.0)
Eosinophils Absolute: 0.1 10*3/uL (ref 0.0–0.5)
HEMATOCRIT: 37.6 % (ref 34.8–46.6)
HGB: 12.7 g/dL (ref 11.6–15.9)
LYMPH#: 0.6 10*3/uL — AB (ref 0.9–3.3)
LYMPH%: 13 % — ABNORMAL LOW (ref 14.0–49.7)
MCH: 29.6 pg (ref 25.1–34.0)
MCHC: 33.9 g/dL (ref 31.5–36.0)
MCV: 87.2 fL (ref 79.5–101.0)
MONO#: 0.3 10*3/uL (ref 0.1–0.9)
MONO%: 7.1 % (ref 0.0–14.0)
NEUT#: 3.3 10*3/uL (ref 1.5–6.5)
NEUT%: 77.5 % — AB (ref 38.4–76.8)
PLATELETS: 208 10*3/uL (ref 145–400)
RBC: 4.31 10*6/uL (ref 3.70–5.45)
RDW: 15.1 % — ABNORMAL HIGH (ref 11.2–14.5)
WBC: 4.2 10*3/uL (ref 3.9–10.3)

## 2016-02-25 MED ORDER — FAMOTIDINE IN NACL 20-0.9 MG/50ML-% IV SOLN
20.0000 mg | Freq: Once | INTRAVENOUS | Status: AC
  Administered 2016-02-25: 20 mg via INTRAVENOUS

## 2016-02-25 MED ORDER — PACLITAXEL CHEMO INJECTION 300 MG/50ML
45.0000 mg/m2 | Freq: Once | INTRAVENOUS | Status: AC
  Administered 2016-02-25: 78 mg via INTRAVENOUS
  Filled 2016-02-25: qty 13

## 2016-02-25 MED ORDER — DIPHENHYDRAMINE HCL 50 MG/ML IJ SOLN
50.0000 mg | Freq: Once | INTRAMUSCULAR | Status: AC
  Administered 2016-02-25: 50 mg via INTRAVENOUS

## 2016-02-25 MED ORDER — PALONOSETRON HCL INJECTION 0.25 MG/5ML
INTRAVENOUS | Status: AC
  Filled 2016-02-25: qty 5

## 2016-02-25 MED ORDER — SODIUM CHLORIDE 0.9 % IV SOLN
20.0000 mg | Freq: Once | INTRAVENOUS | Status: AC
  Administered 2016-02-25: 20 mg via INTRAVENOUS
  Filled 2016-02-25: qty 2

## 2016-02-25 MED ORDER — FAMOTIDINE IN NACL 20-0.9 MG/50ML-% IV SOLN
INTRAVENOUS | Status: AC
  Filled 2016-02-25: qty 50

## 2016-02-25 MED ORDER — PALONOSETRON HCL INJECTION 0.25 MG/5ML
0.2500 mg | Freq: Once | INTRAVENOUS | Status: AC
  Administered 2016-02-25: 0.25 mg via INTRAVENOUS

## 2016-02-25 MED ORDER — SODIUM CHLORIDE 0.9 % IV SOLN
Freq: Once | INTRAVENOUS | Status: AC
  Administered 2016-02-25: 11:00:00 via INTRAVENOUS

## 2016-02-25 MED ORDER — SODIUM CHLORIDE 0.9 % IV SOLN
159.6000 mg | Freq: Once | INTRAVENOUS | Status: AC
  Administered 2016-02-25: 160 mg via INTRAVENOUS
  Filled 2016-02-25: qty 16

## 2016-02-25 MED ORDER — DIPHENHYDRAMINE HCL 50 MG/ML IJ SOLN
INTRAMUSCULAR | Status: AC
  Filled 2016-02-25: qty 1

## 2016-02-25 NOTE — Telephone Encounter (Signed)
Gave patient avs report and appointments for December. [facility] will call re scan.

## 2016-02-25 NOTE — Telephone Encounter (Signed)
Gave patient avs report and appointments for December. [facility] will call re scan.  °

## 2016-02-25 NOTE — Progress Notes (Signed)
Per Dr. Julien Nordmann okay to treat with heart rate of 101.

## 2016-02-25 NOTE — Progress Notes (Signed)
[place] Telephone:([phone]   [phone]  OFFICE PROGRESS NOTE  Barry Dienes, NP [place] [address]  DIAGNOSIS: Stage IIIA (T1b, N2, M0) non-small cell lung cancer, adenocarcinoma presented with right middle lobe lung nodule in addition to mediastinal lymphadenopathy. The patient also has a highly suspicious small nodule in the left upper lobe that could change her disease to a stage IV or at least another synchronous primary lesion in the left upper lobe.  PRIOR THERAPY: None.  CURRENT THERAPY: A course of concurrent chemoradiation with weekly carboplatin for AUC of 2 and paclitaxel 45 MG/M2. Status post 5 cycles.  INTERVAL HISTORY: Courtney Zuniga 65 y.o. female returns to the clinic today for follow-up visit. The patient tolerated the first 5 weeks of her treatment fairly well with no significant adverse effects except for odynophagia. She was started on Carafate and Hycet by Dr. Sondra Come. She lost 6 pounds since her last visit. She denied having any significant chest pain, shortness of breath, cough or hemoptysis. She is here today to start cycle #6 of her treatment. She is expected to complete radiotherapy on 03/03/2016.  MEDICAL HISTORY: Past Medical History:  Diagnosis Date  . Anemia    as a young woman  . Arthritis    osteoartritis  . Asthma   . Brain tumor (benign) ([place]) 2005 [facility]   Benign  . Chronic headaches   . Chronic hip pain   . Chronic pain   . COPD (chronic obstructive pulmonary disease) ([place])   . Coronary artery disease   . Depression   . Encounter for antineoplastic chemotherapy 01/10/2016  . Hypertension   . NSTEMI (non-ST elevated myocardial infarction) ([place])   . On home O2    qhs  . Pneumonia   . Shortness of breath dyspnea     ALLERGIES:  is allergic to no known allergies.  MEDICATIONS:  Current Outpatient Prescriptions  Medication Sig Dispense Refill  . albuterol (PROVENTIL HFA;VENTOLIN HFA) 108  (90 BASE) MCG/ACT inhaler Inhale 2 puffs into the lungs every 6 (six) hours as needed for shortness of breath.     Marland Kitchen albuterol (PROVENTIL) (2.5 MG/3ML) 0.083% nebulizer solution Take 2.5 mg by nebulization every 6 (six) hours as needed for wheezing or shortness of breath.    . Aspirin-Salicylamide-Caffeine (BC HEADACHE POWDER PO) Take 1 packet by mouth daily as needed (headache).    . calcium-vitamin D 250-100 MG-UNIT per tablet Take 1 tablet by mouth daily.    . Cholecalciferol (VITAMIN D3) 5000 units TABS Take 1 tablet by mouth daily.    . Fluticasone-Salmeterol (ADVAIR) 500-50 MCG/DOSE AEPB Inhale 1 puff into the lungs every 12 (twelve) hours.    Marland Kitchen HYDROcodone-acetaminophen (HYCET) 7.5-325 mg/15 ml solution Take 15 mLs by mouth 4 (four) times daily as needed for moderate pain. 120 mL 0  . lisinopril (PRINIVIL,ZESTRIL) 40 MG tablet Take 40 mg by mouth daily. She didn't take it today (02/19/16) because her blood pressure has been running low.    . nitroGLYCERIN (NITROSTAT) 0.4 MG SL tablet Place 0.4 mg under the tongue every 5 (five) minutes as needed for chest pain.    . pantoprazole (PROTONIX) 40 MG tablet Take 40 mg by mouth daily.    . prochlorperazine (COMPAZINE) 10 MG tablet Take 1 tablet (10 mg total) by mouth every 6 (six) hours as needed for nausea or vomiting. 30 tablet 0  . QVAR 80 MCG/ACT inhaler Inhale 1 puff into the lungs  2 (two) times daily as needed.     . sucralfate (CARAFATE) 1 g tablet     . Tiotropium Bromide-Olodaterol (STIOLTO RESPIMAT) 2.5-2.5 MCG/ACT AERS Inhale 1 puff into the lungs daily.    . Wound Dressings (SONAFINE) Apply 1 application topically 2 (two) times daily.     No current facility-administered medications for this visit.     SURGICAL HISTORY:  Past Surgical History:  Procedure Laterality Date  . CHOLECYSTECTOMY    . COLONOSCOPY  2015   Results requested from [facility]  . COLONOSCOPY    . ESOPHAGOGASTRODUODENOSCOPY N/A 08/14/2015    Procedure: ESOPHAGOGASTRODUODENOSCOPY (EGD);  Surgeon: Daneil Dolin, MD;  Location: [facility];  Service: Endoscopy;  Laterality: N/A;  215   . ESOPHAGOGASTRODUODENOSCOPY (EGD) WITH PROPOFOL N/A 09/13/2015   Procedure: ESOPHAGOGASTRODUODENOSCOPY (EGD) WITH PROPOFOL;  Surgeon: Milus Banister, MD;  Location: [facility] ENDOSCOPY;  Service: Endoscopy;  Laterality: N/A;  . TUMOR REMOVAL  2005   Benign  . UPPER ESOPHAGEAL ENDOSCOPIC ULTRASOUND (EUS)  09/13/2015   Procedure: UPPER ESOPHAGEAL ENDOSCOPIC ULTRASOUND (EUS);  Surgeon: Milus Banister, MD;  Location: [facility];  Service: Endoscopy;;  . VIDEO BRONCHOSCOPY WITH ENDOBRONCHIAL NAVIGATION N/A 12/31/2015   Procedure: VIDEO BRONCHOSCOPY WITH ENDOBRONCHIAL NAVIGATION;  Surgeon: Melrose Nakayama, MD;  Location: [facility];  Service: Thoracic;  Laterality: N/A;  . VIDEO BRONCHOSCOPY WITH ENDOBRONCHIAL ULTRASOUND N/A 11/08/2015   Procedure: VIDEO BRONCHOSCOPY WITH ENDOBRONCHIAL ULTRASOUND;  Surgeon: Ivin Poot, MD;  Location: [facility] OR;  Service: Thoracic;  Laterality: N/A;    REVIEW OF SYSTEMS:  A comprehensive review of systems was negative except for: Constitutional: positive for fatigue Gastrointestinal: positive for odynophagia   PHYSICAL EXAMINATION: General appearance: alert, cooperative and no distress Head: Normocephalic, without obvious abnormality, atraumatic Neck: no adenopathy, no JVD, supple, symmetrical, trachea midline and thyroid not enlarged, symmetric, no tenderness/mass/nodules Lymph nodes: Cervical, supraclavicular, and axillary nodes normal. Resp: clear to auscultation bilaterally Back: symmetric, no curvature. ROM normal. No CVA tenderness. Cardio: regular rate and rhythm, S1, S2 normal, no murmur, click, rub or gallop GI: soft, non-tender; bowel sounds normal; no masses,  no organomegaly Extremities: extremities normal, atraumatic, no cyanosis or edema Neurologic: Alert and oriented X 3, normal strength and tone. Normal  symmetric reflexes. Normal coordination and gait  ECOG PERFORMANCE STATUS: 1 - Symptomatic but completely ambulatory  There were no vitals taken for this visit.  LABORATORY DATA: Lab Results  Component Value Date   WBC 4.2 02/25/2016   HGB 12.7 02/25/2016   HCT 37.6 02/25/2016   MCV 87.2 02/25/2016   PLT 208 02/25/2016      Chemistry      Component Value Date/Time   NA 138 02/18/2016 0915   K 3.8 02/18/2016 0915   CL 110 12/31/2015 0941   CO2 24 02/18/2016 0915   BUN 12.0 02/18/2016 0915   CREATININE 0.7 02/18/2016 0915      Component Value Date/Time   CALCIUM 9.2 02/18/2016 0915   ALKPHOS 140 02/18/2016 0915   AST 14 02/18/2016 0915   ALT 10 02/18/2016 0915   BILITOT <0.22 02/18/2016 0915       RADIOGRAPHIC STUDIES: No results found.  ASSESSMENT AND PLAN: This is a very pleasant 65 years old African-American female with stage IIIA non-small cell lung cancer. She is currently undergoing a course of concurrent chemoradiation with weekly carboplatin and paclitaxel is status post 5 cycles. She tolerated the first 5 cycles of her treatment well with no significant  adverse effects except for odynophagia. I recommended for the patient to proceed with cycle #6 of her concurrent chemoradiation today as a scheduled.This will be the last dose of chemotherapy as she is completing radiotherapy on 03/03/2016. For the odynophagia, advised the patient to continue on Carafate and I started her on Hycet 7.5/325 mg liquid by mouth Q4 hours as needed for pain. I will see her back for follow-up visit in 6 weeks for reevaluation with repeat CT scan of the chest for restaging of her disease. She was advised to call immediately if she has any concerning symptoms in the interval. The patient voices understanding of current disease status and treatment options and is in agreement with the current care plan.  All questions were answered. The patient knows to call the clinic with any problems,  questions or concerns. We can certainly see the patient much sooner if necessary.  Disclaimer: This note was dictated with voice recognition software. Similar sounding words can inadvertently be transcribed and may not be corrected upon review.

## 2016-02-25 NOTE — Patient Instructions (Signed)
[facility] Discharge Instructions for Patients Receiving Chemotherapy  Today you received the following chemotherapy agents Taxol and Carboplatin. To help prevent nausea and vomiting after your treatment, we encourage you to take your nausea medication as directed.  If you develop nausea and vomiting that is not controlled by your nausea medication, call the clinic.   BELOW ARE SYMPTOMS THAT SHOULD BE REPORTED IMMEDIATELY:  *FEVER GREATER THAN 100.5 F  *CHILLS WITH OR WITHOUT FEVER  NAUSEA AND VOMITING THAT IS NOT CONTROLLED WITH YOUR NAUSEA MEDICATION  *UNUSUAL SHORTNESS OF BREATH  *UNUSUAL BRUISING OR BLEEDING  TENDERNESS IN MOUTH AND THROAT WITH OR WITHOUT PRESENCE OF ULCERS  *URINARY PROBLEMS  *BOWEL PROBLEMS  UNUSUAL RASH Items with * indicate a potential emergency and should be followed up as soon as possible.  Feel free to call the clinic you have any questions or concerns. The clinic phone number is [phone].  Please show the CHEMO ALERT CARD at check-in to the Emergency Department and triage nurse.    

## 2016-02-26 ENCOUNTER — Ambulatory Visit
Admission: RE | Admit: 2016-02-26 | Discharge: 2016-02-26 | Disposition: A | Discharge status: Home / Self Care | Payer: Medicare HMO | Source: Ambulatory Visit | Attending: Radiation Oncology | Admitting: Radiation Oncology

## 2016-02-26 ENCOUNTER — Telehealth: Payer: Self-pay | Admitting: Oncology

## 2016-02-26 ENCOUNTER — Encounter: Payer: Self-pay | Admitting: Radiation Oncology

## 2016-02-26 ENCOUNTER — Telehealth: Payer: Self-pay

## 2016-02-26 VITALS — BP 114/84 | HR 105 | Temp 98.6°F | Ht 67.0 in | Wt 144.6 lb

## 2016-02-26 DIAGNOSIS — C3491 Malignant neoplasm of unspecified part of right bronchus or lung: Secondary | ICD-10-CM

## 2016-02-26 DIAGNOSIS — Z510 Encounter for antineoplastic radiation therapy: Secondary | ICD-10-CM | POA: Diagnosis not present

## 2016-02-26 MED ORDER — HYDROCODONE-ACETAMINOPHEN 7.5-325 MG/15ML PO SOLN: 15.0000 mL | Freq: Four times a day (QID) | ORAL | 0 refills | Status: DC | PRN

## 2016-02-26 NOTE — Telephone Encounter (Signed)
Received a message from Mickel Crow, a nurse with [facility].  She was wondering if she can get an order for numbing swish and swallow mouth wash or even a Duragesic patch to help with Courtney Zuniga's pain with swallowing.  She requested a return call at [phone].

## 2016-02-26 NOTE — Telephone Encounter (Signed)
Received VM from Mickel Crow, RN with [facility].  Caller states pt had radiation treatment today and was prescribed liquid pain medication by Dr. Sondra Come.  Caller reports pt is experiencing extreme pain when swallowing and feels pt might benefit from a "numbing swish or even a duragesic patch."  VM forward and this message routed to Dr. Clabe Seal RN.  Arbie Cookey, RN states she can be reached at [phone].

## 2016-02-26 NOTE — Telephone Encounter (Signed)
Called Arbie Cookey back and advised her that Bernita should try taking Hycet before meals as long as they are 4 hours apart along with her Carafate. Arbie Cookey verbalized agreement and also asked if Keshia could use chloraseptic spray as needed.  Advised her that would be ok.

## 2016-02-26 NOTE — Progress Notes (Signed)
  Radiation Oncology         [phone] ________________________________  Name: Courtney Zuniga MRN: [id number]  Date: 02/26/2016  DOB: [birth year]  Weekly Radiation Therapy Management    ICD-9-CM ICD-10-CM   1. Adenocarcinoma of right lung, stage 3 (HCC) 162.9 C34.91      Current Dose: 52 Gy     Planned Dose:  60 Gy  Narrative . . . . . . . . The patient presents for routine under treatment assessment.                                  Little has completed 26 fractions to her right lung.  She continues to report having burning pain when she eats or drinks anything.  She has been taking Carafate 4 times a day and Hycet once a day.  She does need a refill on hycet.  She is trying to drink 4 ensures a day.  She reports her breathing is better.  She report having a cough because she feels like she has a scratchy throat.  She had her last chemotherapy infusion yesterday.  She reports having fatigue.  The skin on her chest is intact.  She has slight hyperpigmentation on her right upper back.  She is using sonafine.   She will be given a 1 month follow up appointment.                                  Set-up films were reviewed.                                 The chart was checked. Physical Findings. . .  height is '5\' 7"'$  (1.702 m) and weight is 144 lb 9.6 oz (65.6 kg). Her oral temperature is 98.6 F (37 C). Her blood pressure is 114/84 and her pulse is 105 (abnormal). Her oxygen saturation is 99%. . Weight essentially stable. Lungs are clear to auscultation bilaterally. Heart has regular rate and rhythm. No palpable cervical, supraclavicular, or axillary adenopathy. Abdomen soft, non-tender, normal bowel sounds.  Impression . . . . . . . The patient is tolerating radiation. Plan . . . . . . . . . . . . Continue treatment as planned.  ________________________________   Blair Promise, PhD, MD

## 2016-02-26 NOTE — Progress Notes (Signed)
Courtney Zuniga has completed 26 fractions to her right lung.  She continues to report having burning pain when she eats or drinks anything.  She has been taking Carafate 4 times a day and Hycet once a day.  She does need a refill on hycet.  She is trying to drink 4 ensures a day.  She reports her breathing is better.  She report having a cough because she feels like she has a scratchy throat.  She had her last chemotherapy infusion yesterday.  She reports having fatigue.  The skin on her chest is intact.  She has slight hyperpigmentation on her right upper back.  She is using sonafine.   She will be given a 1 month follow up appointment.  BP 114/84 (BP Location: Right Arm, Patient Position: Sitting)   Pulse (!) 105   Temp 98.6 F (37 C) (Oral)   Ht '5\' 7"'$  (1.702 m)   Wt 144 lb 9.6 oz (65.6 kg)   SpO2 99%   BMI 22.65 kg/m    Wt Readings from Last 3 Encounters:  02/26/16 144 lb 9.6 oz (65.6 kg)  02/25/16 141 lb 8 oz (64.2 kg)  02/19/16 148 lb (67.1 kg)

## 2016-02-27 ENCOUNTER — Emergency Department (HOSPITAL_COMMUNITY): Payer: Medicare HMO

## 2016-02-27 ENCOUNTER — Encounter (HOSPITAL_COMMUNITY): Payer: Self-pay | Admitting: *Deleted

## 2016-02-27 ENCOUNTER — Emergency Department (HOSPITAL_COMMUNITY)
Admission: EM | Admit: 2016-02-27 | Discharge: 2016-02-27 | Disposition: A | Discharge status: Home / Self Care | Payer: Medicare HMO | Attending: Emergency Medicine | Admitting: Emergency Medicine

## 2016-02-27 ENCOUNTER — Other Ambulatory Visit: Payer: Self-pay

## 2016-02-27 ENCOUNTER — Encounter: Payer: Self-pay | Admitting: Nutrition

## 2016-02-27 ENCOUNTER — Ambulatory Visit: Payer: Medicare HMO

## 2016-02-27 ENCOUNTER — Encounter: Payer: Medicare HMO | Admitting: Nutrition

## 2016-02-27 DIAGNOSIS — Z7982 Long term (current) use of aspirin: Secondary | ICD-10-CM | POA: Insufficient documentation

## 2016-02-27 DIAGNOSIS — J45909 Unspecified asthma, uncomplicated: Secondary | ICD-10-CM | POA: Diagnosis not present

## 2016-02-27 DIAGNOSIS — R1010 Upper abdominal pain, unspecified: Secondary | ICD-10-CM | POA: Diagnosis present

## 2016-02-27 DIAGNOSIS — R1013 Epigastric pain: Secondary | ICD-10-CM | POA: Insufficient documentation

## 2016-02-27 DIAGNOSIS — R079 Chest pain, unspecified: Secondary | ICD-10-CM | POA: Diagnosis not present

## 2016-02-27 DIAGNOSIS — R1310 Dysphagia, unspecified: Secondary | ICD-10-CM | POA: Insufficient documentation

## 2016-02-27 DIAGNOSIS — I10 Essential (primary) hypertension: Secondary | ICD-10-CM | POA: Diagnosis not present

## 2016-02-27 DIAGNOSIS — I2510 Atherosclerotic heart disease of native coronary artery without angina pectoris: Secondary | ICD-10-CM | POA: Insufficient documentation

## 2016-02-27 DIAGNOSIS — Z87891 Personal history of nicotine dependence: Secondary | ICD-10-CM | POA: Diagnosis not present

## 2016-02-27 DIAGNOSIS — J441 Chronic obstructive pulmonary disease with (acute) exacerbation: Secondary | ICD-10-CM | POA: Diagnosis not present

## 2016-02-27 DIAGNOSIS — Z79899 Other long term (current) drug therapy: Secondary | ICD-10-CM | POA: Diagnosis not present

## 2016-02-27 LAB — BASIC METABOLIC PANEL
ANION GAP: 9 (ref 5–15)
BUN: 12 mg/dL (ref 6–20)
CHLORIDE: 104 mmol/L (ref 101–111)
CO2: 26 mmol/L (ref 22–32)
Calcium: 9 mg/dL (ref 8.9–10.3)
Creatinine, Ser: 0.58 mg/dL (ref 0.44–1.00)
GFR calc Af Amer: >60 mL/min (ref >60)
GFR calc non Af Amer: >60 mL/min (ref >60)
Glucose, Bld: 119 mg/dL — ABNORMAL HIGH (ref 65–99)
POTASSIUM: 3.5 mmol/L (ref 3.5–5.1)
SODIUM: 139 mmol/L (ref 135–145)

## 2016-02-27 LAB — CBC WITH DIFFERENTIAL/PLATELET
Basophils Absolute: 0 10*3/uL (ref 0.0–0.1)
Basophils Relative: 0 %
EOS ABS: 0 10*3/uL (ref 0.0–0.7)
Eosinophils Relative: 0 %
HCT: 36.5 % (ref 36.0–46.0)
HEMOGLOBIN: 12.6 g/dL (ref 12.0–15.0)
LYMPHS ABS: 0.2 10*3/uL — AB (ref 0.7–4.0)
LYMPHS PCT: 3 %
MCH: 29.7 pg (ref 26.0–34.0)
MCHC: 34.5 g/dL (ref 30.0–36.0)
MCV: 86.1 fL (ref 78.0–100.0)
Monocytes Absolute: 0.4 10*3/uL (ref 0.1–1.0)
Monocytes Relative: 6 %
NEUTROS PCT: 91 %
Neutro Abs: 5.4 10*3/uL (ref 1.7–7.7)
Platelets: 184 10*3/uL (ref 150–400)
RBC: 4.24 MIL/uL (ref 3.87–5.11)
RDW: 15.8 % — ABNORMAL HIGH (ref 11.5–15.5)
WBC: 5.9 10*3/uL (ref 4.0–10.5)

## 2016-02-27 LAB — LIPASE, BLOOD: LIPASE: 11 U/L (ref 11–51)

## 2016-02-27 LAB — HEPATIC FUNCTION PANEL
ALK PHOS: 122 U/L (ref 38–126)
ALT: 12 U/L — ABNORMAL LOW (ref 14–54)
AST: 18 U/L (ref 15–41)
Albumin: 3.8 g/dL (ref 3.5–5.0)
BILIRUBIN TOTAL: 0.3 mg/dL (ref 0.3–1.2)
Bilirubin, Direct: <0.1 mg/dL — ABNORMAL LOW (ref 0.1–0.5)
TOTAL PROTEIN: 6.8 g/dL (ref 6.5–8.1)

## 2016-02-27 LAB — TROPONIN I

## 2016-02-27 MED ORDER — FENTANYL CITRATE (PF) 100 MCG/2ML IJ SOLN
50.0000 ug | Freq: Once | INTRAMUSCULAR | Status: AC
  Administered 2016-02-27: 50 ug via INTRAMUSCULAR
  Filled 2016-02-27: qty 2

## 2016-02-27 MED ORDER — ACETAMINOPHEN 325 MG PO TABS
650.0000 mg | ORAL_TABLET | Freq: Once | ORAL | Status: AC
  Administered 2016-02-27: 650 mg via ORAL
  Filled 2016-02-27: qty 2

## 2016-02-27 MED ORDER — IOPAMIDOL (ISOVUE-370) INJECTION 76%
100.0000 mL | Freq: Once | INTRAVENOUS | Status: AC | PRN
  Administered 2016-02-27: 100 mL via INTRAVENOUS

## 2016-02-27 MED ORDER — SODIUM CHLORIDE 0.9 % IV BOLUS (SEPSIS)
1000.0000 mL | Freq: Once | INTRAVENOUS | Status: AC
  Administered 2016-02-27: 1000 mL via INTRAVENOUS

## 2016-02-27 MED ORDER — SODIUM CHLORIDE 0.9 % IV SOLN
INTRAVENOUS | Status: DC
  Administered 2016-02-27: 12:00:00 via INTRAVENOUS

## 2016-02-27 MED ORDER — GI COCKTAIL ~~LOC~~
30.0000 mL | Freq: Once | ORAL | Status: AC
  Administered 2016-02-27: 30 mL via ORAL
  Filled 2016-02-27: qty 30

## 2016-02-27 MED ORDER — SODIUM CHLORIDE 0.9 % IV BOLUS (SEPSIS)
500.0000 mL | Freq: Once | INTRAVENOUS | Status: AC
  Administered 2016-02-27: 500 mL via INTRAVENOUS

## 2016-02-27 NOTE — ED Notes (Signed)
Pt back from X-ray.  

## 2016-02-27 NOTE — ED Triage Notes (Signed)
Pt c/o chest burning that started after she started taking radiation a month ago,

## 2016-02-27 NOTE — ED Provider Notes (Signed)
[facility] DEPT Provider Note   CSN: [id number] Arrival date & time: 02/27/16  5009     History   Chief Complaint Chief Complaint  Patient presents with  . Chest Pain    HPI Courtney Zuniga is a 65 y.o. female.   Chest Pain      Pt was seen at 0730. Per pt's family and pt, c/o gradual onset and persistence of constant upper abd "burning" for the past 1 month, worse over the past several days. Describes the pain as "burning," starting in her upper mid-abd then radiating up into her chest and throat. Pain worsens when she swallows. Pain began after she began XRT for dx lung CA. Pt states she called her Rad Onc MD yesterday for same, and was given meds suggestions. Pt did not take the meds last night or today, per family. Denies any change in her usual pain pattern. Denies SOB/cough, no palpitations, no N/V/D, no back pain, no fevers, no rash.    Past Medical History:  Diagnosis Date  . Anemia    as a young woman  . Arthritis    osteoartritis  . Asthma   . Brain tumor (benign) ([place]) 2005 [facility]   Benign  . Chronic headaches   . Chronic hip pain   . Chronic pain   . COPD (chronic obstructive pulmonary disease) ([place])   . Coronary artery disease   . Depression   . Encounter for antineoplastic chemotherapy 01/10/2016  . Hypertension   . NSTEMI (non-ST elevated myocardial infarction) ([place])   . On home O2    qhs  . Pneumonia   . Shortness of breath dyspnea     Patient Active Problem List   Diagnosis Date Noted  . Adenocarcinoma of right lung, stage 3 ([place]) 01/10/2016  . Encounter for antineoplastic chemotherapy 01/10/2016  . Lung mass 01/03/2016  . Arthritis   . Gastric nodule   . Mucosal abnormality of stomach   . Abdominal pain 08/08/2015  . Abnormal CT of the abdomen 08/08/2015  . Influenza with pneumonia 06/27/2014  . Hypoxia 06/26/2014  . COPD exacerbation ([place]) 06/26/2014  . CAP (community acquired pneumonia)   . Chest pain, rule out acute myocardial  infarction   . Asthma, chronic   . Chest pain 02/16/2014  . HTN (hypertension) 02/16/2014  . DDD (degenerative disc disease), lumbar 02/04/2011  . Lumbar herniated disc 01/09/2011    Past Surgical History:  Procedure Laterality Date  . CHOLECYSTECTOMY    . COLONOSCOPY  2015   Results requested from [facility]  . COLONOSCOPY    . ESOPHAGOGASTRODUODENOSCOPY N/A 08/14/2015   Procedure: ESOPHAGOGASTRODUODENOSCOPY (EGD);  Surgeon: Daneil Dolin, MD;  Location: [facility];  Service: Endoscopy;  Laterality: N/A;  215   . ESOPHAGOGASTRODUODENOSCOPY (EGD) WITH PROPOFOL N/A 09/13/2015   Procedure: ESOPHAGOGASTRODUODENOSCOPY (EGD) WITH PROPOFOL;  Surgeon: Milus Banister, MD;  Location: [facility] ENDOSCOPY;  Service: Endoscopy;  Laterality: N/A;  . TUMOR REMOVAL  2005   Benign  . UPPER ESOPHAGEAL ENDOSCOPIC ULTRASOUND (EUS)  09/13/2015   Procedure: UPPER ESOPHAGEAL ENDOSCOPIC ULTRASOUND (EUS);  Surgeon: Milus Banister, MD;  Location: [facility];  Service: Endoscopy;;  . VIDEO BRONCHOSCOPY WITH ENDOBRONCHIAL NAVIGATION N/A 12/31/2015   Procedure: VIDEO BRONCHOSCOPY WITH ENDOBRONCHIAL NAVIGATION;  Surgeon: Melrose Nakayama, MD;  Location: [facility];  Service: Thoracic;  Laterality: N/A;  . VIDEO BRONCHOSCOPY WITH ENDOBRONCHIAL ULTRASOUND N/A 11/08/2015   Procedure: VIDEO BRONCHOSCOPY WITH ENDOBRONCHIAL ULTRASOUND;  Surgeon: Ivin Poot, MD;  Location:  [facility] OR;  Service: Thoracic;  Laterality: N/A;    OB History    No data available       Home Medications    Prior to Admission medications   Medication Sig Start Date End Date Taking? Authorizing Provider  albuterol (PROVENTIL HFA;VENTOLIN HFA) 108 (90 BASE) MCG/ACT inhaler Inhale 2 puffs into the lungs every 6 (six) hours as needed for shortness of breath.     Historical Provider, MD  albuterol (PROVENTIL) (2.5 MG/3ML) 0.083% nebulizer solution Take 2.5 mg by nebulization every 6 (six) hours as needed for wheezing or shortness of  breath.    Historical Provider, MD  Aspirin-Salicylamide-Caffeine (BC HEADACHE POWDER PO) Take 1 packet by mouth daily as needed (headache).    Historical Provider, MD  calcium-vitamin D 250-100 MG-UNIT per tablet Take 1 tablet by mouth daily.    Historical Provider, MD  Cholecalciferol (VITAMIN D3) 5000 units TABS Take 1 tablet by mouth daily.    Historical Provider, MD  Fluticasone-Salmeterol (ADVAIR) 500-50 MCG/DOSE AEPB Inhale 1 puff into the lungs every 12 (twelve) hours.    Historical Provider, MD  HYDROcodone-acetaminophen (HYCET) 7.5-325 mg/15 ml solution Take 15 mLs by mouth 4 (four) times daily as needed for moderate pain. 02/26/16 02/25/17  Gery Pray, MD  lisinopril (PRINIVIL,ZESTRIL) 40 MG tablet Take 40 mg by mouth daily. She didn't take it today (02/19/16) because her blood pressure has been running low.    Historical Provider, MD  nitroGLYCERIN (NITROSTAT) 0.4 MG SL tablet Place 0.4 mg under the tongue every 5 (five) minutes as needed for chest pain.    Historical Provider, MD  pantoprazole (PROTONIX) 40 MG tablet Take 40 mg by mouth daily. 12/14/15   Historical Provider, MD  prochlorperazine (COMPAZINE) 10 MG tablet Take 1 tablet (10 mg total) by mouth every 6 (six) hours as needed for nausea or vomiting. 01/10/16   Curt Bears, MD  QVAR 80 MCG/ACT inhaler Inhale 1 puff into the lungs 2 (two) times daily as needed.  02/13/14   Historical Provider, MD  sucralfate (CARAFATE) 1 g tablet  02/04/16   Historical Provider, MD  Tiotropium Bromide-Olodaterol (STIOLTO RESPIMAT) 2.5-2.5 MCG/ACT AERS Inhale 1 puff into the lungs daily.    Historical Provider, MD  Wound Dressings (SONAFINE) Apply 1 application topically 2 (two) times daily.    Historical Provider, MD    Family History Family History  Problem Relation Age of Onset  . Ovarian cancer Mother   . Lung cancer Father   . Lung cancer Brother   . Lung cancer Brother   . Prostate cancer Brother   . Brain cancer Sister     Half  sister    Social History Social History  Substance Use Topics  . Smoking status: Former Smoker    Packs/day: 0.25    Types: Cigarettes    Quit date: 01/15/2016  . Smokeless tobacco: Never Used     Comment: Pt has asked doctor for med to help   . Alcohol use No     Allergies   No known allergies   Review of Systems Review of Systems  Cardiovascular: Positive for chest pain.  ROS: Statement: All systems negative except as marked or noted in the HPI; Constitutional: Negative for fever and chills. ; ; Eyes: Negative for eye pain, redness and discharge. ; ; ENMT: Negative for ear pain, hoarseness, nasal congestion, sinus pressure and sore throat. ; ; Cardiovascular: Negative for palpitations, diaphoresis, dyspnea and peripheral edema. ; ; Respiratory: Negative for  cough, wheezing and stridor. ; ; Gastrointestinal: +abd pain. Negative for nausea, vomiting, diarrhea, blood in stool, hematemesis, jaundice and rectal bleeding. . ; ; Genitourinary: Negative for dysuria, flank pain and hematuria. ; ; Musculoskeletal: Negative for back pain and neck pain. Negative for swelling and trauma.; ; Skin: Negative for pruritus, rash, abrasions, blisters, bruising and skin lesion.; ; Neuro: Negative for headache, lightheadedness and neck stiffness. Negative for weakness, altered level of consciousness, altered mental status, extremity weakness, paresthesias, involuntary movement, seizure and syncope.       Physical Exam Updated Vital Signs BP 117/82   Pulse 112   Temp 98.6 F (37 C) (Oral)   Resp 20   SpO2 93%   BP 114/78   Pulse 104   Temp 98.6 F (37 C) (Oral)   Resp (!) 29   SpO2 95%    Physical Exam 0735: Physical examination:  Nursing notes reviewed; Vital signs and O2 SAT reviewed;  Constitutional: Well developed, Well nourished, Uncomfortable appearing;;; Head:  Normocephalic, atraumatic; Eyes: EOMI, PERRL, No scleral icterus; ENMT: Mouth and pharynx normal, Mucous membranes dry; Neck:  Supple, Full range of motion, No lymphadenopathy; Cardiovascular: Tachycardic rate and rhythm, No gallop; Respiratory: Breath sounds clear & equal bilaterally, No wheezes.  Speaking full sentences with ease, Normal respiratory effort/excursion; Chest: Nontender, Movement normal; Abdomen: Soft, Nontender, Nondistended, Normal bowel sounds; Genitourinary: No CVA tenderness; Extremities: Pulses normal, No tenderness, No edema, No calf edema or asymmetry.; Neuro: AA&Ox3, Major CN grossly intact.  Speech clear. No gross focal motor or sensory deficits in extremities.; Skin: Color normal, Warm, Dry.   ED Treatments / Results  Labs (all labs ordered are listed, but only abnormal results are displayed)   EKG  EKG Interpretation  Date/Time:  Wednesday February 27 2016 06:59:56 EST Ventricular Rate:  110 PR Interval:    QRS Duration: 83 QT Interval:  323 QTC Calculation: 437 R Axis:   -81 Text Interpretation:  Sinus tachycardia Left axis deviation Low voltage, precordial leads Abnrm T, consider ischemia, anterolateral lds ST elevation, consider inferior injury No significant change since last tracing Confirmed by WARD,  DO, KRISTEN ([id number]) on 02/27/2016 7:08:42 AM       Radiology   Procedures Procedures (including critical care time)  Medications Ordered in ED Medications  fentaNYL (SUBLIMAZE) injection 50 mcg (50 mcg Intramuscular Given 02/27/16 0750)  gi cocktail (Maalox,Lidocaine,Donnatal) (30 mLs Oral Given 02/27/16 0749)     Initial Impression / Assessment and Plan / ED Course  I have reviewed the triage vital signs and the nursing notes.  Pertinent labs & imaging results that were available during my care of the patient were reviewed by me and considered in my medical decision making (see chart for details).  MDM Reviewed: previous chart, nursing note and vitals Reviewed previous: labs and ECG Interpretation: labs, ECG and x-ray   Results for orders placed or performed  during the hospital encounter of 02/27/16  CBC with Differential  Result Value Ref Range   WBC 5.9 4.0 - 10.5 K/uL   RBC 4.24 3.87 - 5.11 MIL/uL   Hemoglobin 12.6 12.0 - 15.0 g/dL   HCT 36.5 36.0 - 46.0 %   MCV 86.1 78.0 - 100.0 fL   MCH 29.7 26.0 - 34.0 pg   MCHC 34.5 30.0 - 36.0 g/dL   RDW 15.8 (H) 11.5 - 15.5 %   Platelets 184 150 - 400 K/uL   Neutrophils Relative % 91 %   Neutro Abs 5.4 1.7 - 7.7  K/uL   Lymphocytes Relative 3 %   Lymphs Abs 0.2 (L) 0.7 - 4.0 K/uL   Monocytes Relative 6 %   Monocytes Absolute 0.4 0.1 - 1.0 K/uL   Eosinophils Relative 0 %   Eosinophils Absolute 0.0 0.0 - 0.7 K/uL   Basophils Relative 0 %   Basophils Absolute 0.0 0.0 - 0.1 K/uL  Basic metabolic panel  Result Value Ref Range   Sodium 139 135 - 145 mmol/L   Potassium 3.5 3.5 - 5.1 mmol/L   Chloride 104 101 - 111 mmol/L   CO2 26 22 - 32 mmol/L   Glucose, Bld 119 (H) 65 - 99 mg/dL   BUN 12 6 - 20 mg/dL   Creatinine, Ser 0.58 0.44 - 1.00 mg/dL   Calcium 9.0 8.9 - 10.3 mg/dL   GFR calc non Af Amer >60 >60 mL/min   GFR calc Af Amer >60 >60 mL/min   Anion gap 9 5 - 15  Troponin I  Result Value Ref Range   Troponin I <0.03 <0.03 ng/mL  Hepatic function panel  Result Value Ref Range   Total Protein 6.8 6.5 - 8.1 g/dL   Albumin 3.8 3.5 - 5.0 g/dL   AST 18 15 - 41 U/L   ALT 12 (L) 14 - 54 U/L   Alkaline Phosphatase 122 38 - 126 U/L   Total Bilirubin 0.3 0.3 - 1.2 mg/dL   Bilirubin, Direct <0.1 (L) 0.1 - 0.5 mg/dL   Indirect Bilirubin NOT CALCULATED 0.3 - 0.9 mg/dL  Lipase, blood  Result Value Ref Range   Lipase 11 11 - 51 U/L  Troponin I  Result Value Ref Range   Troponin I <0.03 <0.03 ng/mL   Dg Chest 2 View Result Date: 02/27/2016 CLINICAL DATA:  History of lung carcinoma.  Chest pain. EXAM: CHEST  2 VIEW COMPARISON:  December 31, 2015 chest radiograph and chest CT December 07, 2015 FINDINGS: The mass anterior and inferior to the right hilum noted on prior CT and chest radiograph is no  longer appreciable as a discrete lesion. There is scarring in the right middle lobe region in the area of previous mass. Elsewhere, there is scarring with patchy interstitial fibrosis in the mid lower lung zones. There is no new opacity. The heart size and pulmonary vascularity are normal. No adenopathy. There is atherosclerotic calcification in the aorta. No bone lesions are evident. IMPRESSION: Previously noted mass in the right infrahilar region is no longer appreciable. There is scarring in this area. There is patchy scarring and fibrosis in both lower lung zones bilaterally. There is no edema or consolidation. The heart size is normal. No adenopathy. There is aortic atherosclerosis. Electronically Signed   By: Lowella Grip III M.D.   On: 02/27/2016 07:59   Ct Angio Chest Pe W/cm &/or Wo Cm Result Date: 02/27/2016 CLINICAL DATA:  Chest pain. History of lung carcinoma. Tachycardia and shortness of breath EXAM: CT ANGIOGRAPHY CHEST WITH CONTRAST TECHNIQUE: Multidetector CT imaging of the chest was performed using the standard protocol during bolus administration of intravenous contrast. Multiplanar CT image reconstructions and MIPs were obtained to evaluate the vascular anatomy. CONTRAST:  100 mL Isovue 370 nonionic COMPARISON:  Chest CT December 07, 2015 and chest radiograph October 27, 2015 FINDINGS: Cardiovascular: There is no demonstrable pulmonary embolus. There is no thoracic aortic aneurysm or dissection. The visualized great vessels appear unremarkable except for mild atherosclerotic calcification the origin of the left subclavian artery. Note the right left common carotid arteries  arise common trunk, an anatomic variant. There are scattered foci of atherosclerotic calcification aorta. There are scattered foci of coronary artery calcification. There is mild pericardial thickening. Mediastinum/Nodes: Thyroid appears mildly inhomogeneous without dominant mass evident. There is no appreciable thoracic  adenopathy. Lungs/Pleura: There is underlying bullous emphysematous change with lower lobe primarily noted in the right upper lobe, stable. There is scarring right middle lobe region, most likely at least in part due to radiation therapy change. In the area of the previous mass in the medial aspect of the right middle lobe, there is a persistent opacity measuring 1.1 x 1.1 cm, compared to 2.7 x 1.8 cm on prior study. Noted new mass or nodular lesion is evident. There is scarring in the lingula as well as in the lung bases, stable. There is no right edema or consolidation. No new opacity is evident. Note that the previous right middle lobe consolidation has virtually completely resolved. Upper Abdomen: In the visualized upper abdomen, there is atherosclerotic calcification in the aorta. Visualized upper abdominal structures otherwise unremarkable. Musculoskeletal: There is a hemangioma in the T12 vertebral body, stable. There are no bony lesions suggesting metastatic disease. Bones do appear somewhat osteoporotic. Review of the MIP images confirms the above findings. IMPRESSION: No demonstrable pulmonary embolus. The previously noted right middle lobe central nodular lesion currently measures 1.1 x 1.1 cm compared to 0.7 x 1.8 cm on study from 2.5 months prior. Note new nodular lesion. Areas of scarring and underlying bullous emphysematous change remain stable. There is no longer appreciable consolidation in the right middle lobe compared to prior study. No evident adenopathy. Areas of atherosclerotic calcification in the aorta and several coronary arteries. Mild pericardial thickening. Suspect multinodular goiter without dominant thyroid mass. Stable hemangioma in the T12 vertebral body. Electronically Signed   By: Lowella Grip III M.D.   On: 02/27/2016 12:26    1300:  EPIC Chart reviewed: pt's HR has been 100-108 baseline for the past few months. HR here up into 120's Some improvement after IVF NS bolus.  CT-A performed given hx CA. CT reassuring. IVF continued with improvement to HR back to baseline. Pt feels better after meds and wants to go home now. Doubt PE as cause for symptoms with normal CT-A chest.  Doubt ACS as cause for symptoms with normal troponin x2 and unchanged EKG from previous after 1 month of constant symptoms which were worse over past few days. Dx and testing d/w pt and family.  Questions answered.  Verb understanding, agreeable to d/c home with outpt f/u.    Final Clinical Impressions(s) / ED Diagnoses   Final diagnoses:  None    New Prescriptions New Prescriptions   No medications on file      Francine Graven, DO 03/01/16 2009

## 2016-02-27 NOTE — Progress Notes (Signed)
Patient did not show up or cancel nutrition appointment. Noted patient is currently in the ED.

## 2016-02-27 NOTE — Discharge Instructions (Signed)
Take your usual prescriptions as previously directed.  Increase your fluid intake for the next several days. Call your regular medical doctor today to schedule a follow up appointment within the next 2 days.  Return to the Emergency Department immediately sooner if worsening.

## 2016-02-27 NOTE — ED Notes (Signed)
Pt taken to xray 

## 2016-02-28 ENCOUNTER — Ambulatory Visit
Admission: RE | Admit: 2016-02-28 | Discharge: 2016-02-28 | Disposition: A | Discharge status: Home / Self Care | Payer: Medicare HMO | Source: Ambulatory Visit | Attending: Radiation Oncology | Admitting: Radiation Oncology

## 2016-02-28 DIAGNOSIS — Z510 Encounter for antineoplastic radiation therapy: Secondary | ICD-10-CM | POA: Diagnosis not present

## 2016-02-29 ENCOUNTER — Encounter: Payer: Self-pay | Admitting: *Deleted

## 2016-02-29 ENCOUNTER — Ambulatory Visit
Admission: RE | Admit: 2016-02-29 | Discharge: 2016-02-29 | Disposition: A | Discharge status: Home / Self Care | Payer: Medicare HMO | Source: Ambulatory Visit | Attending: Radiation Oncology | Admitting: Radiation Oncology

## 2016-02-29 DIAGNOSIS — Z510 Encounter for antineoplastic radiation therapy: Secondary | ICD-10-CM | POA: Diagnosis not present

## 2016-02-29 NOTE — Progress Notes (Signed)
[place] Work  Clinical Social Work was referred by patient's [facility] CSW for assessment of psychosocial needs. Per Cindie Sillmon, CSW with [facility] pt is having issues affording her liquid pain medicine as her insurance has not approved use of this medication. Pt is having trouble swallowing due to radiation and cannot swallow pills.Due to cost of this medicine she is not taking it and went to the ED to have her pain addressed.  Pt is also having issues with transportation due to coming from [place]. CSW has assisted pt with gas cards in the past and other resources to assist with cost of transportation in the past and has utilized all resources available.   Clinical Social Worker will check with team to see if pt needs some sort of letter stating medical necessity for liquid medicine and to see if financial counselors have assessed for grant funds. CSW team to follow up with pt when she is here for her last treatments to check in and review needs.   Clinical Social Work interventions: Pt advocacy  Loren Racer, [employer] Worker [place]  [place] Phone: [phone] Fax: [phone]

## 2016-03-03 ENCOUNTER — Ambulatory Visit: Payer: Medicare HMO | Admitting: Internal Medicine

## 2016-03-03 ENCOUNTER — Ambulatory Visit: Payer: Medicare HMO

## 2016-03-03 ENCOUNTER — Ambulatory Visit
Admission: RE | Admit: 2016-03-03 | Discharge: 2016-03-03 | Disposition: A | Discharge status: Home / Self Care | Payer: Medicare HMO | Source: Ambulatory Visit | Attending: Radiation Oncology | Admitting: Radiation Oncology

## 2016-03-03 ENCOUNTER — Telehealth: Payer: Self-pay | Admitting: Oncology

## 2016-03-03 ENCOUNTER — Other Ambulatory Visit: Payer: Medicare HMO

## 2016-03-03 DIAGNOSIS — Z510 Encounter for antineoplastic radiation therapy: Secondary | ICD-10-CM | POA: Diagnosis not present

## 2016-03-03 NOTE — Telephone Encounter (Signed)
[place] to have a prior authorization started for [employer].

## 2016-03-04 ENCOUNTER — Encounter: Payer: Self-pay | Admitting: Radiation Oncology

## 2016-03-04 ENCOUNTER — Telehealth: Payer: Self-pay | Admitting: Oncology

## 2016-03-04 ENCOUNTER — Ambulatory Visit
Admission: RE | Admit: 2016-03-04 | Discharge: 2016-03-04 | Disposition: A | Discharge status: Home / Self Care | Payer: Medicare HMO | Source: Ambulatory Visit | Attending: Radiation Oncology | Admitting: Radiation Oncology

## 2016-03-04 VITALS — BP 106/84 | HR 129 | Temp 98.3°F | Ht 67.0 in | Wt 144.8 lb

## 2016-03-04 DIAGNOSIS — Z510 Encounter for antineoplastic radiation therapy: Secondary | ICD-10-CM | POA: Diagnosis not present

## 2016-03-04 DIAGNOSIS — C3491 Malignant neoplasm of unspecified part of right bronchus or lung: Secondary | ICD-10-CM | POA: Insufficient documentation

## 2016-03-04 MED ORDER — RADIAPLEXRX EX GEL
Freq: Once | CUTANEOUS | Status: AC
  Administered 2016-03-04: 09:00:00 via TOPICAL

## 2016-03-04 MED ORDER — OXYCODONE-ACETAMINOPHEN 5-325 MG PO TABS: 1.0000 | ORAL_TABLET | ORAL | 0 refills | Status: DC | PRN

## 2016-03-04 NOTE — Progress Notes (Addendum)
BP (!) 108/98 (BP Location: Right Arm, Patient Position: Sitting)   Pulse (!) 115   Temp 98.3 F (36.8 C) (Oral)   Ht '5\' 7"'$  (1.702 m)   Wt 144 lb 12.8 oz (65.7 kg)   SpO2 99%   BMI 22.68 kg/m    BP 106/84 (BP Location: Right Arm, Patient Position: Standing)   Pulse (!) 129   Temp 98.3 F (36.8 C) (Oral)   Ht '5\' 7"'$  (1.702 m)   Wt 144 lb 12.8 oz (65.7 kg)   SpO2 99%   BMI 22.68 kg/m    Caila has completed treatment to her right lung.  She continues to report having burning pain in her chest when eating.  She is taking Carafate four times a day and hycet once a day.  Advised her again to take the Hycet 30 minutes before meals.  She also needs a refill.  She reports that her breathing is better.  She reports having a frequent cough especially at night.  She has been taking Alka Seltzer Cold Plus without relief.  She reports having fatigue.  The skin on her right chest is intact.  She has slight hyperpigmentation on her upper right back.  She is using sonafine.  She has been given a one month appointment.  Wt Readings from Last 3 Encounters:  03/04/16 144 lb 12.8 oz (65.7 kg)  02/26/16 144 lb 9.6 oz (65.6 kg)  02/25/16 141 lb 8 oz (64.2 kg)

## 2016-03-04 NOTE — Telephone Encounter (Signed)
Called Carolynn Sayers at [facility] to arrange IV fluids for Kalayla.  Advised her that Dr. Sondra Come has ordered 1 liter of normal saline IV for [place] today or tomorrow.  Pam said that there is a shortage of normal saline and asked if patient could have 1/2 ns or LR insted.  Called her back and gave her a verbal order for 1 liter of LR given IV over 4 hours ([place]).  Pam verbalized agreement and understanding and said the fluids should be given tomorrow.

## 2016-03-04 NOTE — Progress Notes (Signed)
  Radiation Oncology         [phone] ________________________________  Name: Courtney Zuniga MRN: [id number]  Date: 03/04/2016  DOB: June 15, 1950  Weekly Radiation Therapy Management    ICD-9-CM ICD-10-CM   1. Adenocarcinoma of right lung, stage 3 (HCC) 162.9 C34.91 hyaluronate sodium (RADIAPLEXRX) gel     Current Dose: 60 Gy     Planned Dose:  60 Gy  Narrative . . . . . . . . The patient presents for routine under treatment assessment.                                  The patient has completed the course of treatment to her right lung. She continues to report having burning pain in her chest when eating. She is taking carafate 4 x per day and hycet once a day; she requested a refill of the hycet. The patient reports that her breathing is better. She reports a frequent cough, especially at night. She reports taking Alka Seltzer Cold Plus without relief. She reports having fatigue, as well as feeling dizzy when she stands.                                 Set-up films were reviewed.                                 The chart was checked. Physical Findings. . .  height is '5\' 7"'$  (1.702 m) and weight is 144 lb 12.8 oz (65.7 kg). Her oral temperature is 98.3 F (36.8 C). Her blood pressure is 106/84 and her pulse is 129 (abnormal). Her oxygen saturation is 99%. . Weight essentially stable. Lungs are clear to auscultation bilaterally. Heart has regular rate and rhythm. No palpable cervical, supraclavicular, or axillary adenopathy. Abdomen soft, non-tender, normal bowel sounds.  Impression . . . . . . . The patient is tolerating radiation. Plan . . . . . . . . . . . . Today completed the planned course of treatment for this patient.. We discussed the possibility of giving the patient more IV fluids this week; the patient confirmed that this has been scheduled for this Tomorrow at home with home health nurse. I prescribed Xylocaine to be taken 30 minutes before eating; I also prescribed Percocet  5/325 to help alleviate pain. Insurance would not approve liquid pain medication .She will follow up in 1 month. The patient knows I can be reached with any questions or concerns in the interim.  ________________________________   Blair Promise, PhD, MD  This document serves as a record of services personally performed by Gery Pray, MD. It was created on his behalf by Maryla Morrow, a trained medical scribe. The creation of this record is based on the scribe's personal observations and the provider's statements to them. This document has been checked and approved by the attending provider.

## 2016-03-04 NOTE — Telephone Encounter (Signed)
Called in to [facility] per Dr. Sondra Come.  Verified with Mitzi Hansen, Pharmacist that patient will be able to swallow 10 ml of lidocaine without diluting with water.

## 2016-03-05 ENCOUNTER — Encounter: Payer: Self-pay | Admitting: Internal Medicine

## 2016-03-05 NOTE — Progress Notes (Signed)
Auth for Hydrocodone-Acetaminophen was denied on 03/03/16.  Gave denial to the nurse.

## 2016-03-13 ENCOUNTER — Encounter: Payer: Self-pay | Admitting: Radiation Oncology

## 2016-03-13 NOTE — Progress Notes (Signed)
  Radiation Oncology         [phone] ________________________________  Name: Courtney Zuniga MRN: [id number]  Date: 03/13/2016  DOB: 1951-01-11  End of Treatment Note  Diagnosis:  Adenocarcinoma of the right lung, stage 3   Indication for treatment:  Curative      Radiation treatment dates:  01/22/16-03/04/16  Site/dose:   Right lung / 60 Gy in 30 fractions  Beams/energy:   3D / 10X 6X  Narrative: The patient tolerated radiation treatment relatively well. During treatment, the patient complained of a burning pain in her chest while eating. She was treated with Carafate and Hycet for her esophagitis symptoms She also reported a frequent cough. Overall her breathing improved. She did require IV fluid supplementation during the course for treatment.  Plan: The patient has completed radiation treatment. The patient will return to radiation oncology clinic for routine followup in one month. I advised them to call or return sooner if they have any questions or concerns related to their recovery or treatment.  -----------------------------------  Blair Promise, PhD, MD  This document serves as a record of services personally performed by Gery Pray, MD. It was created on his behalf by Bethann Humble, a trained medical scribe. The creation of this record is based on the scribe's personal observations and the provider's statements to them. This document has been checked and approved by the attending provider.

## 2016-03-20 ENCOUNTER — Telehealth: Payer: Self-pay | Admitting: Oncology

## 2016-03-20 ENCOUNTER — Other Ambulatory Visit: Payer: Self-pay | Admitting: Radiation Oncology

## 2016-03-20 MED ORDER — OXYCODONE-ACETAMINOPHEN 5-325 MG PO TABS: 1.0000 | ORAL_TABLET | ORAL | 0 refills | Status: DC | PRN

## 2016-03-20 NOTE — Telephone Encounter (Signed)
Received a message from [place] at [facility].  They cannot except the faxed refill order for percocet.  She can fill the order and we can mail them a copy.

## 2016-03-21 NOTE — Telephone Encounter (Signed)
Called Tonya at [facility] back and advised her that a prescription will be mailed today.

## 2016-04-03 ENCOUNTER — Ambulatory Visit: Payer: Medicare HMO | Admitting: Radiation Oncology

## 2016-04-08 ENCOUNTER — Ambulatory Visit (HOSPITAL_COMMUNITY)
Admission: RE | Admit: 2016-04-08 | Discharge: 2016-04-08 | Disposition: A | Discharge status: Home / Self Care | Payer: Medicare HMO | Source: Ambulatory Visit | Attending: Internal Medicine | Admitting: Internal Medicine

## 2016-04-08 ENCOUNTER — Other Ambulatory Visit (HOSPITAL_BASED_OUTPATIENT_CLINIC_OR_DEPARTMENT_OTHER): Payer: Medicare HMO

## 2016-04-08 DIAGNOSIS — I700 Atherosclerosis of aorta: Secondary | ICD-10-CM | POA: Diagnosis not present

## 2016-04-08 DIAGNOSIS — Z5111 Encounter for antineoplastic chemotherapy: Secondary | ICD-10-CM | POA: Diagnosis present

## 2016-04-08 DIAGNOSIS — C342 Malignant neoplasm of middle lobe, bronchus or lung: Secondary | ICD-10-CM | POA: Diagnosis not present

## 2016-04-08 DIAGNOSIS — I2510 Atherosclerotic heart disease of native coronary artery without angina pectoris: Secondary | ICD-10-CM | POA: Diagnosis not present

## 2016-04-08 DIAGNOSIS — C3491 Malignant neoplasm of unspecified part of right bronchus or lung: Secondary | ICD-10-CM

## 2016-04-08 DIAGNOSIS — J439 Emphysema, unspecified: Secondary | ICD-10-CM | POA: Diagnosis not present

## 2016-04-08 DIAGNOSIS — R911 Solitary pulmonary nodule: Secondary | ICD-10-CM | POA: Diagnosis not present

## 2016-04-08 LAB — CBC WITH DIFFERENTIAL/PLATELET
BASO%: 0.5 % (ref 0.0–2.0)
BASOS ABS: 0 10*3/uL (ref 0.0–0.1)
EOS%: 1.3 % (ref 0.0–7.0)
Eosinophils Absolute: 0.1 10*3/uL (ref 0.0–0.5)
HCT: 40.1 % (ref 34.8–46.6)
HGB: 13.8 g/dL (ref 11.6–15.9)
LYMPH%: 9.4 % — AB (ref 14.0–49.7)
MCH: 31 pg (ref 25.1–34.0)
MCHC: 34.5 g/dL (ref 31.5–36.0)
MCV: 89.8 fL (ref 79.5–101.0)
MONO#: 0.7 10*3/uL (ref 0.1–0.9)
MONO%: 8.4 % (ref 0.0–14.0)
NEUT#: 6.3 10*3/uL (ref 1.5–6.5)
NEUT%: 80.4 % — AB (ref 38.4–76.8)
Platelets: 280 10*3/uL (ref 145–400)
RBC: 4.46 10*6/uL (ref 3.70–5.45)
RDW: 16.8 % — ABNORMAL HIGH (ref 11.2–14.5)
WBC: 7.9 10*3/uL (ref 3.9–10.3)
lymph#: 0.7 10*3/uL — ABNORMAL LOW (ref 0.9–3.3)

## 2016-04-08 LAB — COMPREHENSIVE METABOLIC PANEL
ALT: 9 U/L (ref 0–55)
AST: 14 U/L (ref 5–34)
Albumin: 3.6 g/dL (ref 3.5–5.0)
Alkaline Phosphatase: 195 U/L — ABNORMAL HIGH (ref 40–150)
Anion Gap: 10 mEq/L (ref 3–11)
BUN: 14.5 mg/dL (ref 7.0–26.0)
CHLORIDE: 109 meq/L (ref 98–109)
CO2: 25 mEq/L (ref 22–29)
Calcium: 9.7 mg/dL (ref 8.4–10.4)
Creatinine: 0.7 mg/dL (ref 0.6–1.1)
EGFR: >90 mL/min/{1.73_m2} (ref >90)
GLUCOSE: 75 mg/dL (ref 70–140)
POTASSIUM: 3.6 meq/L (ref 3.5–5.1)
SODIUM: 143 meq/L (ref 136–145)
Total Bilirubin: <0.22 mg/dL (ref 0.20–1.20)
Total Protein: 7.3 g/dL (ref 6.4–8.3)

## 2016-04-08 MED ORDER — IOPAMIDOL (ISOVUE-300) INJECTION 61%
75.0000 mL | Freq: Once | INTRAVENOUS | Status: AC | PRN
  Administered 2016-04-08: 75 mL via INTRAVENOUS

## 2016-04-09 ENCOUNTER — Encounter: Payer: Self-pay | Admitting: Internal Medicine

## 2016-04-09 ENCOUNTER — Telehealth: Payer: Self-pay | Admitting: Internal Medicine

## 2016-04-09 ENCOUNTER — Ambulatory Visit (HOSPITAL_BASED_OUTPATIENT_CLINIC_OR_DEPARTMENT_OTHER): Payer: Medicare HMO | Admitting: Internal Medicine

## 2016-04-09 VITALS — BP 130/80 | HR 111 | Temp 98.6°F | Resp 17 | Ht 67.0 in | Wt 140.7 lb

## 2016-04-09 DIAGNOSIS — J449 Chronic obstructive pulmonary disease, unspecified: Secondary | ICD-10-CM | POA: Diagnosis not present

## 2016-04-09 DIAGNOSIS — I10 Essential (primary) hypertension: Secondary | ICD-10-CM | POA: Diagnosis not present

## 2016-04-09 DIAGNOSIS — C342 Malignant neoplasm of middle lobe, bronchus or lung: Secondary | ICD-10-CM

## 2016-04-09 DIAGNOSIS — C3491 Malignant neoplasm of unspecified part of right bronchus or lung: Secondary | ICD-10-CM

## 2016-04-09 DIAGNOSIS — Z5111 Encounter for antineoplastic chemotherapy: Secondary | ICD-10-CM

## 2016-04-09 NOTE — Progress Notes (Signed)
DISCONTINUE ON PATHWAY REGIMEN - Non-Small Cell Lung  UGQ916: Carboplatin AUC=2 + Paclitaxel 45 mg/m2 Weekly During Radiation   Administer weekly:     Paclitaxel (Taxol(R)) 45 mg/m2 in 250 mL NS IV over 1 hour followed by Dose Mod: None     Carboplatin (Paraplatin(R)) AUC=2 in 100 mL NS IV over 30 minutes Dose Mod: None Additional Orders: * All AUC calculations intended to be used in [employer] formula  **Always confirm dose/schedule in your pharmacy ordering system**    REASON: Continuation Of Treatment PRIOR TREATMENT: XIH038: Carboplatin AUC=2 + Paclitaxel 45 mg/m2 Weekly During Radiation TREATMENT RESPONSE: Partial Response (PR)  START ON PATHWAY REGIMEN - Non-Small Cell Lung  LOS23: Carboplatin AUC=6 + Paclitaxel 200 mg/m2 q21 Days x 2-3 Cycles   A cycle is every 21 days:     Paclitaxel (Taxol(R)) 200 mg/m2 in 500 mL NS IV over 3 hours followed by Dose Mod: None     Carboplatin (Paraplatin(R)) AUC=6 in 250 mL NS IV over 1 hour Dose Mod: None Additional Orders: * All AUC calculations intended to be used in [employer] formula  **Always confirm dose/schedule in your pharmacy ordering system**    Patient Characteristics: Stage III - Unresectable, PS = 0, 1 Check here if patient was staged using an edition prior to AJCC Staging - 8th Edition (i.e., prior to April 14, 2016)? false Performance Status: PS = 0, 1  Intent of Therapy: Curative Intent, Discussed with Patient

## 2016-04-09 NOTE — Progress Notes (Signed)
[facility] [phone]   [phone]  OFFICE PROGRESS NOTE  Barry Dienes, NP [place] [address]  DIAGNOSIS: Stage IIIA (T1b, N2, M0) non-small cell lung cancer presented with right middle lobe pulmonary nodule, mediastinal lymphadenopathy and highly suspicious for small nodule in the left upper lobe that could change her stage to stage IV that could present another synchronous primary lesion in the left upper lobe. This was diagnosed in September 2017.  PRIOR THERAPY: Concurrent chemoradiation with weekly carboplatin for AUC of 2 and paclitaxel 45 MG/M2 status post 6 cycles last dose was given 02/25/2016 with partial response.  CURRENT THERAPY: Consolidation chemotherapy with carboplatin for AUC of 5 and paclitaxel 175 MG/M2 every 3 weeks with Neulasta support. First dose 04/22/2016.  INTERVAL HISTORY: Courtney Zuniga 65 y.o. female returns to the clinic today for follow-up visit accompanied by her daughter and sister. The patient completed a course of concurrent chemoradiation with weekly carboplatin and paclitaxel and tolerated her treatment well. She denied having any current chest pain, shortness of breath, cough or hemoptysis. Her odynophagia has significantly improved. She denied having any significant weight loss or night sweats. She has no fever or chills. She denied having any nausea, vomiting, diarrhea or constipation. She had repeat CT scan of the chest performed recently and she is here for evaluation and discussion of her scan results.  MEDICAL HISTORY: Past Medical History:  Diagnosis Date  . Anemia    as a young woman  . Arthritis    osteoartritis  . Asthma   . Brain tumor (benign) ([place]) 2005 [facility]   Benign  . Chronic headaches   . Chronic hip pain   . Chronic pain   . COPD (chronic obstructive pulmonary disease) ([place])   . Coronary artery disease   . Depression   . Encounter for antineoplastic chemotherapy  01/10/2016  . Hypertension   . NSTEMI (non-ST elevated myocardial infarction) ([place])   . On home O2    qhs  . Pneumonia   . Shortness of breath dyspnea     ALLERGIES:  is allergic to no known allergies.  MEDICATIONS:  Current Outpatient Prescriptions  Medication Sig Dispense Refill  . albuterol (PROVENTIL HFA;VENTOLIN HFA) 108 (90 BASE) MCG/ACT inhaler Inhale 2 puffs into the lungs every 6 (six) hours as needed for shortness of breath.     Marland Kitchen albuterol (PROVENTIL) (2.5 MG/3ML) 0.083% nebulizer solution Take 2.5 mg by nebulization every 6 (six) hours as needed for wheezing or shortness of breath.    . Aspirin-Salicylamide-Caffeine (BC HEADACHE POWDER PO) Take 1 packet by mouth daily as needed (headache).    . calcium-vitamin D 250-100 MG-UNIT per tablet Take 1 tablet by mouth daily.    . Cholecalciferol (VITAMIN D3) 5000 units TABS Take 1 tablet by mouth daily.    Marland Kitchen HYDROcodone-acetaminophen (HYCET) 7.5-325 mg/15 ml solution Take 15 mLs by mouth 4 (four) times daily as needed for moderate pain. 120 mL 0  . lidocaine (XYLOCAINE) 2 % solution Use as directed 10 mLs in the mouth or throat as needed for mouth pain (swallow 10 ml 30 minutes before a meal as needed.. Disp 250 cc. 0 refills.).     Marland Kitchen lisinopril (PRINIVIL,ZESTRIL) 40 MG tablet Take 40 mg by mouth daily. She didn't take it today (02/19/16) because her blood pressure has been running low.    . nitroGLYCERIN (NITROSTAT) 0.4 MG SL tablet Place 0.4 mg under the tongue every 5 (five)  minutes as needed for chest pain.    Marland Kitchen oxyCODONE-acetaminophen (PERCOCET/ROXICET) 5-325 MG tablet Take 1 tablet by mouth every 4 (four) hours as needed for severe pain. 30 tablet 0  . pantoprazole (PROTONIX) 40 MG tablet Take 40 mg by mouth daily.    . prochlorperazine (COMPAZINE) 10 MG tablet Take 1 tablet (10 mg total) by mouth every 6 (six) hours as needed for nausea or vomiting. 30 tablet 0  . QVAR 80 MCG/ACT inhaler Inhale 1 puff into the lungs 2 (two) times  daily as needed.     . sucralfate (CARAFATE) 1 g tablet 1 g 4 (four) times daily.     . Tiotropium Bromide-Olodaterol (STIOLTO RESPIMAT) 2.5-2.5 MCG/ACT AERS Inhale 1 puff into the lungs daily.    . Fluticasone-Salmeterol (ADVAIR) 500-50 MCG/DOSE AEPB Inhale 1 puff into the lungs every 12 (twelve) hours.     No current facility-administered medications for this visit.     SURGICAL HISTORY:  Past Surgical History:  Procedure Laterality Date  . CHOLECYSTECTOMY    . COLONOSCOPY  2015   Results requested from [facility]  . COLONOSCOPY    . ESOPHAGOGASTRODUODENOSCOPY N/A 08/14/2015   Procedure: ESOPHAGOGASTRODUODENOSCOPY (EGD);  Surgeon: Daneil Dolin, MD;  Location: [facility];  Service: Endoscopy;  Laterality: N/A;  215   . ESOPHAGOGASTRODUODENOSCOPY (EGD) WITH PROPOFOL N/A 09/13/2015   Procedure: ESOPHAGOGASTRODUODENOSCOPY (EGD) WITH PROPOFOL;  Surgeon: Milus Banister, MD;  Location: [facility] ENDOSCOPY;  Service: Endoscopy;  Laterality: N/A;  . TUMOR REMOVAL  2005   Benign  . UPPER ESOPHAGEAL ENDOSCOPIC ULTRASOUND (EUS)  09/13/2015   Procedure: UPPER ESOPHAGEAL ENDOSCOPIC ULTRASOUND (EUS);  Surgeon: Milus Banister, MD;  Location: [facility];  Service: Endoscopy;;  . VIDEO BRONCHOSCOPY WITH ENDOBRONCHIAL NAVIGATION N/A 12/31/2015   Procedure: VIDEO BRONCHOSCOPY WITH ENDOBRONCHIAL NAVIGATION;  Surgeon: Melrose Nakayama, MD;  Location: [facility];  Service: Thoracic;  Laterality: N/A;  . VIDEO BRONCHOSCOPY WITH ENDOBRONCHIAL ULTRASOUND N/A 11/08/2015   Procedure: VIDEO BRONCHOSCOPY WITH ENDOBRONCHIAL ULTRASOUND;  Surgeon: Ivin Poot, MD;  Location: [facility] OR;  Service: Thoracic;  Laterality: N/A;    REVIEW OF SYSTEMS:  Constitutional: negative Eyes: negative Ears, nose, mouth, throat, and face: negative Respiratory: negative Cardiovascular: negative Gastrointestinal: negative Genitourinary:negative Integument/breast: negative Hematologic/lymphatic:  negative Musculoskeletal:negative Neurological: negative Behavioral/Psych: negative Endocrine: negative Allergic/Immunologic: negative   PHYSICAL EXAMINATION: General appearance: alert, cooperative and no distress Head: Normocephalic, without obvious abnormality, atraumatic Neck: no adenopathy, no JVD, supple, symmetrical, trachea midline and thyroid not enlarged, symmetric, no tenderness/mass/nodules Lymph nodes: Cervical, supraclavicular, and axillary nodes normal. Resp: clear to auscultation bilaterally Back: symmetric, no curvature. ROM normal. No CVA tenderness. Cardio: regular rate and rhythm, S1, S2 normal, no murmur, click, rub or gallop GI: soft, non-tender; bowel sounds normal; no masses,  no organomegaly Extremities: extremities normal, atraumatic, no cyanosis or edema Neurologic: Alert and oriented X 3, normal strength and tone. Normal symmetric reflexes. Normal coordination and gait  ECOG PERFORMANCE STATUS: 1 - Symptomatic but completely ambulatory  Blood pressure 130/80, pulse (!) 111, temperature 98.6 F (37 C), temperature source Oral, resp. rate 17, height '5\' 7"'$  (1.702 m), weight 140 lb 11.2 oz (63.8 kg), SpO2 95 %.  LABORATORY DATA: Lab Results  Component Value Date   WBC 7.9 04/08/2016   HGB 13.8 04/08/2016   HCT 40.1 04/08/2016   MCV 89.8 04/08/2016   PLT 280 04/08/2016      Chemistry      Component Value Date/Time   NA  143 04/08/2016 1001   K 3.6 04/08/2016 1001   CL 104 02/27/2016 0734   CO2 25 04/08/2016 1001   BUN 14.5 04/08/2016 1001   CREATININE 0.7 04/08/2016 1001      Component Value Date/Time   CALCIUM 9.7 04/08/2016 1001   ALKPHOS 195 (H) 04/08/2016 1001   AST 14 04/08/2016 1001   ALT 9 04/08/2016 1001   BILITOT <0.22 04/08/2016 1001       RADIOGRAPHIC STUDIES: Ct Chest W Contrast  Result Date: 04/08/2016 CLINICAL DATA:  Re- stage right middle lobe lung adenocarcinoma, stage IIIA (T1b, N2, M0), diagnosed September 2017, completed  concurrent chemoradiation therapy 03/04/2016. EXAM: CT CHEST WITH CONTRAST TECHNIQUE: Multidetector CT imaging of the chest was performed during intravenous contrast administration. CONTRAST:  27m ISOVUE-300 IOPAMIDOL (ISOVUE-300) INJECTION 61% COMPARISON:  02/27/2016 chest CT angiogram. 12/07/2015 chest CT. 10/23/2015 PET-CT. FINDINGS: Cardiovascular: Normal heart size. No significant pericardial fluid/thickening. Left anterior descending and right coronary atherosclerosis. Atherosclerotic nonaneurysmal thoracic aorta. Stable top-normal size main pulmonary artery. No central pulmonary emboli. Mediastinum/Nodes: No discrete thyroid nodules. Unremarkable esophagus. No pathologically enlarged axillary, mediastinal or hilar lymph nodes. Lungs/Pleura: No pneumothorax. No pleural effusion. Moderate centrilobular and paraseptal emphysema. Peripheral superior segment right lower lobe irregular 0.6 cm nodular opacity (series 7/ image 72) is stable since 09/25/2015, favor benign nodular scarring. Focal ground-glass opacity described in the right lower lobe on the 10/23/2015 PET-CT study has resolved and was probably inflammatory. There is a small residual 1.0 x 0.4 cm right middle lobe pulmonary nodule (series 7/image 97), decreased from 1.2 x 0.8 cm on 02/27/2016 and 2.7 x 1.8 cm on 12/07/2015 chest CT studies using similar measurement technique. There is improved aeration of the right middle lobe. Increased sharply marginated bandlike opacity in the medial basilar right upper lobe, favor early postradiation change. Additional parenchymal bands in the posterior lingula and basilar lower lobes, stable back to 09/25/2015 chest CT, consistent with postinfectious/postinflammatory scarring. Left upper lobe irregular 4 mm pulmonary nodule (series 7/image 54) is stable from 4 mm on 02/27/2016 chest CT and slightly decreased from 6 mm on 12/07/2015 chest CT using similar measurement technique. No new significant pulmonary nodules.  Upper abdomen: Unremarkable. Musculoskeletal: No aggressive appearing focal osseous lesions. Sclerotic foci in the lateral right seventh rib are unchanged back to the 06/23/2003, considered benign. Mild thoracic spondylosis. IMPRESSION: 1. Significant partial treatment response in the treated right middle lobe neoplasm, which now measures 1.0 x 0.4 cm. 2. Irregular 0.4 cm left upper lobe pulmonary nodule is slightly decreased in size since 12/07/2015, and was hypermetabolic on 025/95/6387PET-CT, which may represent a treatment response with a malignant nodule. 3. No new or progressive metastatic disease in the chest. No pathologically enlarged thoracic nodes. 4. Additional findings include aortic atherosclerosis, 2 vessel coronary atherosclerosis and moderate emphysema. Electronically Signed   By: JIlona SorrelM.D.   On: 04/08/2016 12:49    ASSESSMENT AND PLAN: This is a very pleasant 65years old African-American female with:  1) Stage IIIA non-small cell lung cancer. She is status post a course of concurrent chemoradiation. She has a recent CT scan of the chest for restaging of her disease. I personally and independently reviewed the scan images and discuss the results with the patient and her family today. The scan showed improvement of her disease. I discussed with the patient several options for management of her condition including close monitoring and observation versus consideration of 3 cycles of consolidation chemotherapy with carboplatin for AUC  of 5 and paclitaxel 175 MG/M2 every 3 weeks with Neulasta support. I discussed with the patient the adverse effect of the chemotherapy including but not limited to alopecia, myelosuppression, nausea and vomiting, peripheral neuropathy, liver or renal dysfunction. The patient is interested in proceeding with the consolidation chemotherapy. She is expected to start the first cycle of this treatment on 04/22/2016. She would come back for follow-up visit  in 5 weeks with the start of cycle #2. 2) COPD: The patient will continue her treatment with Stiolto Respimat as well as albuterol inhalers. 3) hypertension: Her blood pressure is well controlled she will continue her current treatment with lisinopril. The patient was advised to call immediately if she has any concerning symptoms in the interval. The patient voices understanding of current disease status and treatment options and is in agreement with the current care plan.  All questions were answered. The patient knows to call the clinic with any problems, questions or concerns. We can certainly see the patient much sooner if necessary.  Disclaimer: This note was dictated with voice recognition software. Similar sounding words can inadvertently be transcribed and may not be corrected upon review.

## 2016-04-09 NOTE — Telephone Encounter (Signed)
Appointments scheduled per 12/27 LOS. Patient given AVS report and calendars with future scheduled appointments.

## 2016-04-17 ENCOUNTER — Telehealth: Payer: Self-pay | Admitting: Oncology

## 2016-04-17 ENCOUNTER — Ambulatory Visit: Admission: RE | Admit: 2016-04-17 | Payer: Medicare HMO | Source: Ambulatory Visit | Admitting: Radiation Oncology

## 2016-04-17 NOTE — Telephone Encounter (Signed)
Left a message for [facility] regarding her follow up appointment with Dr. Sondra Come today.  Requested a return call.

## 2016-04-22 ENCOUNTER — Ambulatory Visit: Payer: Medicare HMO

## 2016-04-22 ENCOUNTER — Other Ambulatory Visit: Payer: Medicare HMO

## 2016-04-24 ENCOUNTER — Other Ambulatory Visit (HOSPITAL_BASED_OUTPATIENT_CLINIC_OR_DEPARTMENT_OTHER): Payer: Medicare HMO

## 2016-04-24 ENCOUNTER — Ambulatory Visit (HOSPITAL_BASED_OUTPATIENT_CLINIC_OR_DEPARTMENT_OTHER): Payer: Medicare HMO

## 2016-04-24 ENCOUNTER — Ambulatory Visit: Payer: Medicare HMO | Admitting: Internal Medicine

## 2016-04-24 VITALS — BP 121/87 | HR 101 | Temp 98.6°F | Resp 18

## 2016-04-24 DIAGNOSIS — C342 Malignant neoplasm of middle lobe, bronchus or lung: Secondary | ICD-10-CM

## 2016-04-24 DIAGNOSIS — Z5111 Encounter for antineoplastic chemotherapy: Secondary | ICD-10-CM

## 2016-04-24 DIAGNOSIS — C3491 Malignant neoplasm of unspecified part of right bronchus or lung: Secondary | ICD-10-CM

## 2016-04-24 LAB — CBC WITH DIFFERENTIAL/PLATELET
BASO%: 0.2 % (ref 0.0–2.0)
BASOS ABS: 0 10*3/uL (ref 0.0–0.1)
EOS%: 2.2 % (ref 0.0–7.0)
Eosinophils Absolute: 0.1 10*3/uL (ref 0.0–0.5)
HEMATOCRIT: 40.2 % (ref 34.8–46.6)
HEMOGLOBIN: 13.9 g/dL (ref 11.6–15.9)
LYMPH#: 1.1 10*3/uL (ref 0.9–3.3)
LYMPH%: 18.1 % (ref 14.0–49.7)
MCH: 30.2 pg (ref 25.1–34.0)
MCHC: 34.6 g/dL (ref 31.5–36.0)
MCV: 87.4 fL (ref 79.5–101.0)
MONO#: 0.4 10*3/uL (ref 0.1–0.9)
MONO%: 6 % (ref 0.0–14.0)
NEUT#: 4.3 10*3/uL (ref 1.5–6.5)
NEUT%: 73.5 % (ref 38.4–76.8)
PLATELETS: 288 10*3/uL (ref 145–400)
RBC: 4.6 10*6/uL (ref 3.70–5.45)
RDW: 16.1 % — AB (ref 11.2–14.5)
WBC: 5.8 10*3/uL (ref 3.9–10.3)

## 2016-04-24 LAB — COMPREHENSIVE METABOLIC PANEL
ALBUMIN: 3.6 g/dL (ref 3.5–5.0)
ALK PHOS: 193 U/L — AB (ref 40–150)
ALT: <6 U/L (ref 0–55)
AST: 15 U/L (ref 5–34)
Anion Gap: 10 mEq/L (ref 3–11)
BILIRUBIN TOTAL: 0.25 mg/dL (ref 0.20–1.20)
BUN: 13.6 mg/dL (ref 7.0–26.0)
CALCIUM: 9.5 mg/dL (ref 8.4–10.4)
CO2: 25 mEq/L (ref 22–29)
Chloride: 109 mEq/L (ref 98–109)
Creatinine: 0.8 mg/dL (ref 0.6–1.1)
Glucose: 77 mg/dl (ref 70–140)
POTASSIUM: 4 meq/L (ref 3.5–5.1)
Sodium: 145 mEq/L (ref 136–145)
TOTAL PROTEIN: 7.1 g/dL (ref 6.4–8.3)

## 2016-04-24 MED ORDER — PEGFILGRASTIM 6 MG/0.6ML ~~LOC~~ PSKT
6.0000 mg | PREFILLED_SYRINGE | Freq: Once | SUBCUTANEOUS | Status: AC
  Administered 2016-04-24: 6 mg via SUBCUTANEOUS
  Filled 2016-04-24: qty 0.6

## 2016-04-24 MED ORDER — DIPHENHYDRAMINE HCL 50 MG/ML IJ SOLN
INTRAMUSCULAR | Status: AC
  Filled 2016-04-24: qty 1

## 2016-04-24 MED ORDER — SODIUM CHLORIDE 0.9 % IV SOLN
Freq: Once | INTRAVENOUS | Status: AC
  Administered 2016-04-24: 11:00:00 via INTRAVENOUS

## 2016-04-24 MED ORDER — SODIUM CHLORIDE 0.9 % IV SOLN
400.0000 mg | Freq: Once | INTRAVENOUS | Status: AC
  Administered 2016-04-24: 400 mg via INTRAVENOUS
  Filled 2016-04-24: qty 40

## 2016-04-24 MED ORDER — SODIUM CHLORIDE 0.9 % IV SOLN
175.0000 mg/m2 | Freq: Once | INTRAVENOUS | Status: AC
  Administered 2016-04-24: 306 mg via INTRAVENOUS
  Filled 2016-04-24: qty 51

## 2016-04-24 MED ORDER — PALONOSETRON HCL INJECTION 0.25 MG/5ML
INTRAVENOUS | Status: AC
  Filled 2016-04-24: qty 5

## 2016-04-24 MED ORDER — FAMOTIDINE IN NACL 20-0.9 MG/50ML-% IV SOLN
INTRAVENOUS | Status: AC
  Filled 2016-04-24: qty 50

## 2016-04-24 MED ORDER — PALONOSETRON HCL INJECTION 0.25 MG/5ML
0.2500 mg | Freq: Once | INTRAVENOUS | Status: AC
  Administered 2016-04-24: 0.25 mg via INTRAVENOUS

## 2016-04-24 MED ORDER — FAMOTIDINE IN NACL 20-0.9 MG/50ML-% IV SOLN
20.0000 mg | Freq: Once | INTRAVENOUS | Status: AC
  Administered 2016-04-24: 20 mg via INTRAVENOUS

## 2016-04-24 MED ORDER — DIPHENHYDRAMINE HCL 50 MG/ML IJ SOLN
50.0000 mg | Freq: Once | INTRAMUSCULAR | Status: AC
  Administered 2016-04-24: 50 mg via INTRAVENOUS

## 2016-04-24 MED ORDER — SODIUM CHLORIDE 0.9 % IV SOLN
20.0000 mg | Freq: Once | INTRAVENOUS | Status: AC
  Administered 2016-04-24: 20 mg via INTRAVENOUS
  Filled 2016-04-24: qty 2

## 2016-04-24 NOTE — Progress Notes (Signed)
Education performed for ON pro injection. Pt verbalizes understanding. Printed instructions given to pt. Pt stable at discharge.

## 2016-04-24 NOTE — Patient Instructions (Signed)
[facility] Discharge Instructions for Patients Receiving Chemotherapy  Today you received the following chemotherapy agents: Taxol and Carboplatin   To help prevent nausea and vomiting after your treatment, we encourage you to take your nausea medication as directed.    If you develop nausea and vomiting that is not controlled by your nausea medication, call the clinic.   BELOW ARE SYMPTOMS THAT SHOULD BE REPORTED IMMEDIATELY:  *FEVER GREATER THAN 100.5 F  *CHILLS WITH OR WITHOUT FEVER  NAUSEA AND VOMITING THAT IS NOT CONTROLLED WITH YOUR NAUSEA MEDICATION  *UNUSUAL SHORTNESS OF BREATH  *UNUSUAL BRUISING OR BLEEDING  TENDERNESS IN MOUTH AND THROAT WITH OR WITHOUT PRESENCE OF ULCERS  *URINARY PROBLEMS  *BOWEL PROBLEMS  UNUSUAL RASH Items with * indicate a potential emergency and should be followed up as soon as possible.  Feel free to call the clinic you have any questions or concerns. The clinic phone number is [phone].  Please show the [facility] at check-in to the Emergency Department and triage nurse.    Pegfilgrastim injection What is this medicine? PEGFILGRASTIM (PEG fil gra stim) is a long-acting granulocyte colony-stimulating factor that stimulates the growth of neutrophils, a type of white blood cell important in the body's fight against infection. It is used to reduce the incidence of fever and infection in patients with certain types of cancer who are receiving chemotherapy that affects the bone marrow, and to increase survival after being exposed to high doses of radiation. This medicine may be used for other purposes; ask your health care provider or pharmacist if you have questions. COMMON BRAND NAME(S): Neulasta What should I tell my health care provider before I take this medicine? They need to know if you have any of these conditions: -kidney disease -latex allergy -ongoing radiation therapy -sickle cell disease -skin  reactions to acrylic adhesives (On-Body Injector only) -an unusual or allergic reaction to pegfilgrastim, filgrastim, other medicines, foods, dyes, or preservatives -pregnant or trying to get pregnant -breast-feeding How should I use this medicine? This medicine is for injection under the skin. If you get this medicine at home, you will be taught how to prepare and give the pre-filled syringe or how to use the On-body Injector. Refer to the patient Instructions for Use for detailed instructions. Use exactly as directed. Take your medicine at regular intervals. Do not take your medicine more often than directed. It is important that you put your used needles and syringes in a special sharps container. Do not put them in a trash can. If you do not have a sharps container, call your pharmacist or healthcare provider to get one. Talk to your pediatrician regarding the use of this medicine in children. While this drug may be prescribed for selected conditions, precautions do apply. Overdosage: If you think you have taken too much of this medicine contact a poison control center or emergency room at once. NOTE: This medicine is only for you. Do not share this medicine with others. What if I miss a dose? It is important not to miss your dose. Call your doctor or health care professional if you miss your dose. If you miss a dose due to an On-body Injector failure or leakage, a new dose should be administered as soon as possible using a single prefilled syringe for manual use. What may interact with this medicine? Interactions have not been studied. Give your health care provider a list of all the medicines, herbs, non-prescription drugs, or dietary supplements you  use. Also tell them if you smoke, drink alcohol, or use illegal drugs. Some items may interact with your medicine. This list may not describe all possible interactions. Give your health care provider a list of all the medicines, herbs,  non-prescription drugs, or dietary supplements you use. Also tell them if you smoke, drink alcohol, or use illegal drugs. Some items may interact with your medicine. What should I watch for while using this medicine? You may need blood work done while you are taking this medicine. If you are going to need a MRI, CT scan, or other procedure, tell your doctor that you are using this medicine (On-Body Injector only). What side effects may I notice from receiving this medicine? Side effects that you should report to your doctor or health care professional as soon as possible: -allergic reactions like skin rash, itching or hives, swelling of the face, lips, or tongue -dizziness -fever -pain, redness, or irritation at site where injected -pinpoint red spots on the skin -red or dark-brown urine -shortness of breath or breathing problems -stomach or side pain, or pain at the shoulder -swelling -tiredness -trouble passing urine or change in the amount of urine Side effects that usually do not require medical attention (report to your doctor or health care professional if they continue or are bothersome): -bone pain -muscle pain This list may not describe all possible side effects. Call your doctor for medical advice about side effects. You may report side effects to FDA at 1-800-FDA-1088. Where should I keep my medicine? Keep out of the reach of children. Store pre-filled syringes in a refrigerator between 2 and 8 degrees C (36 and 46 degrees F). Do not freeze. Keep in carton to protect from light. Throw away this medicine if it is left out of the refrigerator for more than 48 hours. Throw away any unused medicine after the expiration date. NOTE: This sheet is a summary. It may not cover all possible information. If you have questions about this medicine, talk to your doctor, pharmacist, or health care provider.  2017 Elsevier/Gold Standard (2014-04-20 14:30:14)   START Claritin daily starting  today for 7 days

## 2016-04-29 ENCOUNTER — Emergency Department (HOSPITAL_COMMUNITY)
Admission: EM | Admit: 2016-04-29 | Discharge: 2016-04-29 | Disposition: A | Discharge status: Home / Self Care | Payer: Medicare HMO | Attending: Emergency Medicine | Admitting: Emergency Medicine

## 2016-04-29 ENCOUNTER — Other Ambulatory Visit: Payer: Medicare HMO

## 2016-04-29 ENCOUNTER — Emergency Department (HOSPITAL_COMMUNITY): Payer: Medicare HMO

## 2016-04-29 ENCOUNTER — Other Ambulatory Visit: Payer: Self-pay

## 2016-04-29 ENCOUNTER — Encounter (HOSPITAL_COMMUNITY): Payer: Self-pay | Admitting: Emergency Medicine

## 2016-04-29 DIAGNOSIS — J449 Chronic obstructive pulmonary disease, unspecified: Secondary | ICD-10-CM | POA: Diagnosis not present

## 2016-04-29 DIAGNOSIS — I10 Essential (primary) hypertension: Secondary | ICD-10-CM | POA: Insufficient documentation

## 2016-04-29 DIAGNOSIS — I2510 Atherosclerotic heart disease of native coronary artery without angina pectoris: Secondary | ICD-10-CM | POA: Diagnosis not present

## 2016-04-29 DIAGNOSIS — M791 Myalgia, unspecified site: Secondary | ICD-10-CM

## 2016-04-29 DIAGNOSIS — M545 Low back pain: Secondary | ICD-10-CM | POA: Insufficient documentation

## 2016-04-29 DIAGNOSIS — J45909 Unspecified asthma, uncomplicated: Secondary | ICD-10-CM | POA: Diagnosis not present

## 2016-04-29 DIAGNOSIS — R609 Edema, unspecified: Secondary | ICD-10-CM | POA: Diagnosis not present

## 2016-04-29 DIAGNOSIS — D72829 Elevated white blood cell count, unspecified: Secondary | ICD-10-CM | POA: Insufficient documentation

## 2016-04-29 DIAGNOSIS — Z79899 Other long term (current) drug therapy: Secondary | ICD-10-CM | POA: Diagnosis not present

## 2016-04-29 DIAGNOSIS — Z87891 Personal history of nicotine dependence: Secondary | ICD-10-CM | POA: Diagnosis not present

## 2016-04-29 DIAGNOSIS — Z7982 Long term (current) use of aspirin: Secondary | ICD-10-CM | POA: Diagnosis not present

## 2016-04-29 DIAGNOSIS — C3491 Malignant neoplasm of unspecified part of right bronchus or lung: Secondary | ICD-10-CM | POA: Insufficient documentation

## 2016-04-29 DIAGNOSIS — R0602 Shortness of breath: Secondary | ICD-10-CM | POA: Diagnosis present

## 2016-04-29 DIAGNOSIS — C3490 Malignant neoplasm of unspecified part of unspecified bronchus or lung: Secondary | ICD-10-CM

## 2016-04-29 HISTORY — DX: Malignant neoplasm of unspecified part of unspecified bronchus or lung: C34.90

## 2016-04-29 LAB — CBC
HCT: 39.5 % (ref 36.0–46.0)
Hemoglobin: 13.8 g/dL (ref 12.0–15.0)
MCH: 30.9 pg (ref 26.0–34.0)
MCHC: 34.9 g/dL (ref 30.0–36.0)
MCV: 88.6 fL (ref 78.0–100.0)
PLATELETS: 189 10*3/uL (ref 150–400)
RBC: 4.46 MIL/uL (ref 3.87–5.11)
RDW: 15.5 % (ref 11.5–15.5)
WBC: 22 10*3/uL — AB (ref 4.0–10.5)

## 2016-04-29 LAB — BASIC METABOLIC PANEL
Anion gap: 10 (ref 5–15)
BUN: 10 mg/dL (ref 6–20)
CO2: 28 mmol/L (ref 22–32)
CREATININE: 0.53 mg/dL (ref 0.44–1.00)
Calcium: 9.5 mg/dL (ref 8.9–10.3)
Chloride: 102 mmol/L (ref 101–111)
GFR calc non Af Amer: >60 mL/min (ref >60)
Glucose, Bld: 87 mg/dL (ref 65–99)
Potassium: 3.5 mmol/L (ref 3.5–5.1)
SODIUM: 140 mmol/L (ref 135–145)

## 2016-04-29 LAB — I-STAT TROPONIN, ED: TROPONIN I, POC: 0 ng/mL (ref 0.00–0.08)

## 2016-04-29 MED ORDER — HYDROMORPHONE HCL 1 MG/ML IJ SOLN
1.0000 mg | Freq: Once | INTRAMUSCULAR | Status: AC
  Administered 2016-04-29: 1 mg via INTRAVENOUS
  Filled 2016-04-29: qty 1

## 2016-04-29 MED ORDER — ONDANSETRON HCL 4 MG/2ML IJ SOLN
4.0000 mg | Freq: Once | INTRAMUSCULAR | Status: AC
  Administered 2016-04-29: 4 mg via INTRAVENOUS
  Filled 2016-04-29: qty 2

## 2016-04-29 MED ORDER — HYDROCODONE-ACETAMINOPHEN 5-325 MG PO TABS: 1.0000 | ORAL_TABLET | Freq: Four times a day (QID) | ORAL | 0 refills | Status: DC | PRN

## 2016-04-29 MED ORDER — SODIUM CHLORIDE 0.9 % IV SOLN: INTRAVENOUS | Status: DC

## 2016-04-29 MED ORDER — SODIUM CHLORIDE 0.9 % IV BOLUS (SEPSIS)
250.0000 mL | Freq: Once | INTRAVENOUS | Status: AC
  Administered 2016-04-29: 250 mL via INTRAVENOUS

## 2016-04-29 NOTE — ED Triage Notes (Signed)
Per EMS: Pt had second chemo tx last Thursday for chemo tx.  Pt is c/o pain and weakness in bilateral legs.  Pt also having h/a.   104 cbg, 98.5 temp, 162/114,

## 2016-04-29 NOTE — ED Provider Notes (Signed)
[facility] DEPT Provider Note   CSN: [id number] Arrival date & time: 04/29/16  0945   By signing my name below, I, Collene Leyden, attest that this documentation has been prepared under the direction and in the presence of Fredia Sorrow, MD. Electronically Signed: Collene Leyden, Scribe. 04/29/16. 11:47 AM.   History   Chief Complaint Chief Complaint  Patient presents with  . Leg Pain  . Shortness of Breath    HPI Comments: Courtney Zuniga is a 66 y.o. female with a hx of lung cancer, CAD, and COPD on 2L of home O2, who presents to the Emergency Department complaining of generalized pain that began after treatment. Patient had chemotherapy treatment at [facility] long 6 days ago. Patient has associated shortness of breath, cough, congestion, and headache. No modifying factors indicated. She denies any surgery, fever, or vomiting.   The history is provided by the patient. No language interpreter was used.  Leg Pain   This is a new problem. The current episode started more than 2 days ago. The problem occurs constantly. The problem has not changed since onset.The quality of the pain is described as aching. Pertinent negatives include no numbness and no tingling. She has tried nothing for the symptoms.  Shortness of Breath  This is a chronic problem. The problem has not changed since onset.Associated symptoms include headaches, cough, chest pain and leg pain. Pertinent negatives include no fever, no rhinorrhea, no sore throat, no vomiting, no abdominal pain, no rash and no leg swelling. She has tried nothing for the symptoms. Associated medical issues include COPD.    Past Medical History:  Diagnosis Date  . Anemia    as a young woman  . Arthritis    osteoartritis  . Asthma   . Brain tumor (benign) ([place]) 2005 [facility]   Benign  . Chronic headaches   . Chronic hip pain   . Chronic pain   . COPD (chronic obstructive pulmonary disease) ([place])   . Coronary artery disease   .  Depression   . Encounter for antineoplastic chemotherapy 01/10/2016  . Hypertension   . Lung cancer ([place])   . NSTEMI (non-ST elevated myocardial infarction) ([place])   . On home O2    qhs  . Pneumonia   . Shortness of breath dyspnea     Patient Active Problem List   Diagnosis Date Noted  . Adenocarcinoma of right lung, stage 3 ([place]) 01/10/2016  . Encounter for antineoplastic chemotherapy 01/10/2016  . Lung mass 01/03/2016  . Arthritis   . Gastric nodule   . Mucosal abnormality of stomach   . Abdominal pain 08/08/2015  . Abnormal CT of the abdomen 08/08/2015  . Influenza with pneumonia 06/27/2014  . Hypoxia 06/26/2014  . COPD exacerbation ([place]) 06/26/2014  . CAP (community acquired pneumonia)   . Chest pain, rule out acute myocardial infarction   . Asthma, chronic   . Chest pain 02/16/2014  . HTN (hypertension) 02/16/2014  . DDD (degenerative disc disease), lumbar 02/04/2011  . Lumbar herniated disc 01/09/2011    Past Surgical History:  Procedure Laterality Date  . CHOLECYSTECTOMY    . COLONOSCOPY  2015   [facility]  . COLONOSCOPY    . ESOPHAGOGASTRODUODENOSCOPY N/A 08/14/2015   Procedure: ESOPHAGOGASTRODUODENOSCOPY (EGD);  Surgeon: Daneil Dolin, MD;  [facility];  Service: Endoscopy;  Laterality: N/A;  215   . ESOPHAGOGASTRODUODENOSCOPY (EGD) WITH PROPOFOL N/A 09/13/2015   Procedure: ESOPHAGOGASTRODUODENOSCOPY (EGD) WITH PROPOFOL;  Surgeon: Melene Plan  Ardis Hughs, MD;  Location: [facility];  Service: Endoscopy;  Laterality: N/A;  . TUMOR REMOVAL  2005   Benign  . UPPER ESOPHAGEAL ENDOSCOPIC ULTRASOUND (EUS)  09/13/2015   Procedure: UPPER ESOPHAGEAL ENDOSCOPIC ULTRASOUND (EUS);  Surgeon: Milus Banister, MD;  Location: [facility];  Service: Endoscopy;;  . VIDEO BRONCHOSCOPY WITH ENDOBRONCHIAL NAVIGATION N/A 12/31/2015   Procedure: VIDEO BRONCHOSCOPY WITH ENDOBRONCHIAL NAVIGATION;  Surgeon: Melrose Nakayama, MD;  Location: [facility];   Service: Thoracic;  Laterality: N/A;  . VIDEO BRONCHOSCOPY WITH ENDOBRONCHIAL ULTRASOUND N/A 11/08/2015   Procedure: VIDEO BRONCHOSCOPY WITH ENDOBRONCHIAL ULTRASOUND;  Surgeon: Ivin Poot, MD;  Location: [facility] OR;  Service: Thoracic;  Laterality: N/A;    OB History    No data available       Home Medications    Prior to Admission medications   Medication Sig Start Date End Date Taking? Authorizing Provider  albuterol (PROVENTIL HFA;VENTOLIN HFA) 108 (90 BASE) MCG/ACT inhaler Inhale 2 puffs into the lungs every 6 (six) hours as needed for shortness of breath.    Yes Historical Provider, MD  albuterol (PROVENTIL) (2.5 MG/3ML) 0.083% nebulizer solution Take 2.5 mg by nebulization every 6 (six) hours as needed for wheezing or shortness of breath.   Yes Historical Provider, MD  Aspirin-Salicylamide-Caffeine (BC HEADACHE POWDER PO) Take 1 packet by mouth daily as needed (headache).   Yes Historical Provider, MD  calcium-vitamin D 250-100 MG-UNIT per tablet Take 1 tablet by mouth daily.   Yes Historical Provider, MD  Cholecalciferol (VITAMIN D3) 5000 units TABS Take 1 tablet by mouth daily.   Yes Historical Provider, MD  Fluticasone-Salmeterol (ADVAIR) 500-50 MCG/DOSE AEPB Inhale 1 puff into the lungs every 12 (twelve) hours.   Yes Historical Provider, MD  lidocaine (XYLOCAINE) 2 % solution Use as directed 10 mLs in the mouth or throat as needed for mouth pain (swallow 10 ml 30 minutes before a meal as needed.. Disp 250 cc. 0 refills.).    Yes Gery Pray, MD  nitroGLYCERIN (NITROSTAT) 0.4 MG SL tablet Place 0.4 mg under the tongue every 5 (five) minutes as needed for chest pain.   Yes Historical Provider, MD  oxyCODONE-acetaminophen (PERCOCET/ROXICET) 5-325 MG tablet Take 1 tablet by mouth every 4 (four) hours as needed for severe pain. 03/20/16  Yes Gery Pray, MD  pantoprazole (PROTONIX) 40 MG tablet Take 40 mg by mouth daily. 12/14/15  Yes Historical Provider, MD  prochlorperazine (COMPAZINE)  10 MG tablet Take 1 tablet (10 mg total) by mouth every 6 (six) hours as needed for nausea or vomiting. 01/10/16  Yes Curt Bears, MD  QVAR 80 MCG/ACT inhaler Inhale 1 puff into the lungs 2 (two) times daily as needed.  02/13/14  Yes Historical Provider, MD  sucralfate (CARAFATE) 1 g tablet 1 g 4 (four) times daily.  02/04/16  Yes Historical Provider, MD  Tiotropium Bromide-Olodaterol (STIOLTO RESPIMAT) 2.5-2.5 MCG/ACT AERS Inhale 1 puff into the lungs daily.   Yes Historical Provider, MD  HYDROcodone-acetaminophen (NORCO/VICODIN) 5-325 MG tablet Take 1-2 tablets by mouth every 6 (six) hours as needed. 04/29/16   Fredia Sorrow, MD    Family History Family History  Problem Relation Age of Onset  . Ovarian cancer Mother   . Lung cancer Father   . Lung cancer Brother   . Lung cancer Brother   . Prostate cancer Brother   . Brain cancer Sister     Half sister    Social History Social History  Substance Use Topics  .  Smoking status: Former Smoker    Packs/day: 0.25    Types: Cigarettes    Quit date: 01/15/2016  . Smokeless tobacco: Never Used     Comment: Pt has asked doctor for med to help   . Alcohol use No     Allergies   No known allergies   Review of Systems Review of Systems  Constitutional: Negative for chills and fever.  HENT: Positive for congestion. Negative for rhinorrhea and sore throat.   Eyes: Negative for visual disturbance.  Respiratory: Positive for cough and shortness of breath.   Cardiovascular: Positive for chest pain. Negative for leg swelling.  Gastrointestinal: Negative for abdominal pain, diarrhea, nausea and vomiting.  Genitourinary: Negative for dysuria.  Musculoskeletal: Positive for back pain.  Skin: Negative for rash.  Neurological: Positive for headaches. Negative for tingling and numbness.  Psychiatric/Behavioral: Negative for confusion.     Physical Exam Updated Vital Signs BP 138/90   Pulse 91   Temp 98.1 F (36.7 C) (Oral)   Resp  18   Ht '5\' 7"'$  (1.702 m)   Wt 63.5 kg   SpO2 99%   BMI 21.93 kg/m   Physical Exam  Constitutional: She is oriented to person, place, and time. She appears well-developed.  HENT:  Head: Normocephalic and atraumatic.  Mouth/Throat: Oropharynx is clear and moist.  Eyes: Conjunctivae are normal. Pupils are equal, round, and reactive to light. No scleral icterus.  Neck: Normal range of motion. Neck supple.  Cardiovascular: Normal rate.   No murmur heard. Tachycardic.  Pulmonary/Chest: Effort normal. She has no wheezes.  Abdominal: Soft. Bowel sounds are normal. There is no tenderness.  Musculoskeletal: Normal range of motion. She exhibits edema.  Neurological: She is alert and oriented to person, place, and time.  Skin: Skin is warm and dry.     ED Treatments / Results  DIAGNOSTIC STUDIES: Oxygen Saturation is 98% on RA, normal by my interpretation.    COORDINATION OF CARE: 11:47 AM Discussed treatment plan with pt at bedside and pt agreed to plan.  Labs (all labs ordered are listed, but only abnormal results are displayed) Labs Reviewed  CBC - Abnormal; Notable for the following:       Result Value   WBC 22.0 (*)    All other components within normal limits  CULTURE, BLOOD (ROUTINE X 2)  CULTURE, BLOOD (ROUTINE X 2)  BASIC METABOLIC PANEL  URINALYSIS, ROUTINE W REFLEX MICROSCOPIC  I-STAT TROPOININ, ED    EKG  EKG Interpretation None      ED ECG REPORT   Date: 04/29/2016  Rate: 106  Rhythm: sinus tachycardia  QRS Axis: left  Intervals: normal  ST/T Wave abnormalities: nonspecific ST/T changes  Conduction Disutrbances:none  Narrative Interpretation:   Old EKG Reviewed: none available  I have personally reviewed the EKG tracing and agree with the computerized printout as noted.  Radiology Dg Chest 2 View  Result Date: 04/29/2016 CLINICAL DATA:  Shortness of breath for months, worsening, history hypertension, asthma, coronary artery disease post NSTEMI,  COPD, lung cancer, former smoker EXAM: CHEST  2 VIEW COMPARISON:  12/31/2015; correlation interval CT chest 04/08/2016 FINDINGS: Normal heart size, mediastinal contours, and pulmonary vascularity. Atherosclerotic calcification aorta with minimal aortic tortuosity. Emphysematous and minimal bronchitic changes consistent with COPD. Increased atelectasis at lung bases versus prior study particularly RIGHT middle lobe. No definite acute infiltrate, pleural effusion or pneumothorax. BILATERAL nipple shadows again identified. Diffuse osseous demineralization. IMPRESSION: COPD changes with increased bibasilar atelectasis particularly in RIGHT  middle lobe. Aortic atherosclerosis. Electronically Signed   By: Lavonia Dana M.D.   On: 04/29/2016 11:08   Ct Chest Wo Contrast  Result Date: 04/29/2016 CLINICAL DATA:  Chest pain, cough, and shortness of breath. Currently undergoing chemotherapy for right lung adenocarcinoma. Previous radiation therapy. EXAM: CT CHEST WITHOUT CONTRAST TECHNIQUE: Multidetector CT imaging of the chest was performed following the standard protocol without IV contrast. COMPARISON:  04/08/2016 FINDINGS: Cardiovascular: No acute findings. Aortic and coronary artery atherosclerosis. Mediastinum/Nodes: No masses or pathologically enlarged lymph nodes identified on this unenhanced exam. Lungs/Pleura: Moderate emphysema and bibasilar scarring remain stable. Increased right middle lobe volume loss seen with persistent central air bronchograms. This obscures previously seen right middle lobe nodule. No evidence of centrally obstructing mass on this unenhanced exam. Sub-cm nodular densities in the anterior left upper lobe and superior right lower lobe remain stable. No new or enlarging pulmonary nodules or masses identified. No evidence of pleural effusion. Upper Abdomen:  Unremarkable. Musculoskeletal:  No suspicious bone lesions. IMPRESSION: Increased right middle lobe atelectasis, which obscures previously  seen right middle lobe pulmonary nodule. No evidence of centrally obstructing mass or lymphadenopathy. Other bilateral sub-cm pulmonary nodules remain stable. No new or progressive disease identified within the thorax. Stable moderate emphysema and bibasilar scarring. Aortic and coronary artery atherosclerosis. Electronically Signed   By: Earle Gell M.D.   On: 04/29/2016 13:41    Procedures Procedures (including critical care time)  Medications Ordered in ED Medications  0.9 %  sodium chloride infusion (not administered)  sodium chloride 0.9 % bolus 250 mL (0 mLs Intravenous Stopped 04/29/16 1330)  ondansetron (ZOFRAN) injection 4 mg (4 mg Intravenous Given 04/29/16 1247)  HYDROmorphone (DILAUDID) injection 1 mg (1 mg Intravenous Given 04/29/16 1247)  HYDROmorphone (DILAUDID) injection 1 mg (1 mg Intravenous Given 04/29/16 1505)     Initial Impression / Assessment and Plan / ED Course  I have reviewed the triage vital signs and the nursing notes.  Pertinent labs & imaging results that were available during my care of the patient were reviewed by me and considered in my medical decision making (see chart for details).  Clinical Course    Patient undergoing second course of chemotherapy for non-small cell lung cancer. Patient previously completed chemotherapy and radiation treatment. Patient with her new regimen had her first dose on Thursday. January 11. Patient's vulva hematology oncology clinic at [facility] long. Patient on Friday had some sweats and hot flashes. But none since then. But has developed some body aches. May be a slight cough. Chest x-ray here today raise some concerns with CT chest shows no acute changes no evidence of pneumonia. Patient does have a significant leukocytosis. Patient's vital signs here are nontoxic no acute distress. No fevers. No hypotension. No respiratory distress. Patient's body aches improved with hydromorphone here. Will discharge home with hydrocodone. Patient  currently not on any pain medicines at home. Patient will call hematology oncology clinic in the morning for close follow-up. Blood cultures were drawn and are pending.   Final Clinical Impressions(s) / ED Diagnoses   Final diagnoses:  Myalgia  Malignant neoplasm of lung, unspecified laterality, unspecified part of lung (HCC)  Leukocytosis, unspecified type    New Prescriptions New Prescriptions   HYDROCODONE-ACETAMINOPHEN (NORCO/VICODIN) 5-325 MG TABLET    Take 1-2 tablets by mouth every 6 (six) hours as needed.   I personally performed the services described in this documentation, which was scribed in my presence. The recorded information has been reviewed and is  accurate.       Fredia Sorrow, MD 04/29/16 [id number]

## 2016-04-29 NOTE — ED Notes (Signed)
Patient given discharge instruction, verbalized understand. IV removed, band aid applied. Patient wheelchair out of the department.  

## 2016-04-29 NOTE — ED Notes (Signed)
EDP at bedside to asses pt.

## 2016-04-29 NOTE — Discharge Instructions (Signed)
CT of the chest today shows no significant acute findings. No evidence of pneumonia. Take pain medicine as directed. Call hematology oncology clinic in the morning for follow-up. Today's main concern was the elevated white blood cell count. But no fevers. Return for any new or worse symptoms. Return for development of fever. Blood cultures were drawn and are pending.

## 2016-05-01 ENCOUNTER — Other Ambulatory Visit: Payer: Medicare HMO

## 2016-05-05 LAB — CULTURE, BLOOD (ROUTINE X 2)
Culture: NO GROWTH
Culture: NO GROWTH

## 2016-05-06 ENCOUNTER — Other Ambulatory Visit: Payer: Medicare HMO

## 2016-05-08 ENCOUNTER — Other Ambulatory Visit: Payer: Medicare HMO

## 2016-05-13 ENCOUNTER — Ambulatory Visit: Payer: Medicare HMO | Admitting: Internal Medicine

## 2016-05-13 ENCOUNTER — Ambulatory Visit: Payer: Medicare HMO

## 2016-05-13 ENCOUNTER — Other Ambulatory Visit: Payer: Medicare HMO

## 2016-05-15 ENCOUNTER — Encounter: Payer: Self-pay | Admitting: Internal Medicine

## 2016-05-15 ENCOUNTER — Other Ambulatory Visit (HOSPITAL_BASED_OUTPATIENT_CLINIC_OR_DEPARTMENT_OTHER): Payer: Medicare HMO

## 2016-05-15 ENCOUNTER — Ambulatory Visit (HOSPITAL_BASED_OUTPATIENT_CLINIC_OR_DEPARTMENT_OTHER): Payer: Medicare HMO | Admitting: Internal Medicine

## 2016-05-15 ENCOUNTER — Ambulatory Visit (HOSPITAL_BASED_OUTPATIENT_CLINIC_OR_DEPARTMENT_OTHER): Payer: Medicare HMO

## 2016-05-15 VITALS — BP 129/80 | HR 102 | Temp 98.5°F | Resp 18 | Ht 67.0 in | Wt 135.2 lb

## 2016-05-15 DIAGNOSIS — Z5111 Encounter for antineoplastic chemotherapy: Secondary | ICD-10-CM

## 2016-05-15 DIAGNOSIS — R634 Abnormal weight loss: Secondary | ICD-10-CM | POA: Diagnosis not present

## 2016-05-15 DIAGNOSIS — C342 Malignant neoplasm of middle lobe, bronchus or lung: Secondary | ICD-10-CM

## 2016-05-15 DIAGNOSIS — C3491 Malignant neoplasm of unspecified part of right bronchus or lung: Secondary | ICD-10-CM

## 2016-05-15 DIAGNOSIS — F329 Major depressive disorder, single episode, unspecified: Secondary | ICD-10-CM

## 2016-05-15 DIAGNOSIS — F32A Depression, unspecified: Secondary | ICD-10-CM | POA: Insufficient documentation

## 2016-05-15 HISTORY — DX: Depression, unspecified: F32.A

## 2016-05-15 LAB — CBC WITH DIFFERENTIAL/PLATELET
BASO%: 0.4 % (ref 0.0–2.0)
BASOS ABS: 0 10*3/uL (ref 0.0–0.1)
EOS%: 0.8 % (ref 0.0–7.0)
Eosinophils Absolute: 0.1 10*3/uL (ref 0.0–0.5)
HEMATOCRIT: 37.4 % (ref 34.8–46.6)
HEMOGLOBIN: 13.6 g/dL (ref 11.6–15.9)
LYMPH#: 0.9 10*3/uL (ref 0.9–3.3)
LYMPH%: 12.1 % — ABNORMAL LOW (ref 14.0–49.7)
MCH: 30.2 pg (ref 25.1–34.0)
MCHC: 36.4 g/dL — AB (ref 31.5–36.0)
MCV: 82.9 fL (ref 79.5–101.0)
MONO#: 0.7 10*3/uL (ref 0.1–0.9)
MONO%: 9.7 % (ref 0.0–14.0)
NEUT#: 5.7 10*3/uL (ref 1.5–6.5)
NEUT%: 77 % — AB (ref 38.4–76.8)
PLATELETS: 287 10*3/uL (ref 145–400)
RBC: 4.51 10*6/uL (ref 3.70–5.45)
RDW: 15.5 % — AB (ref 11.2–14.5)
WBC: 7.3 10*3/uL (ref 3.9–10.3)

## 2016-05-15 LAB — COMPREHENSIVE METABOLIC PANEL
ALBUMIN: 4 g/dL (ref 3.5–5.0)
ALK PHOS: 211 U/L — AB (ref 40–150)
ALT: 10 U/L (ref 0–55)
ANION GAP: 10 meq/L (ref 3–11)
AST: 15 U/L (ref 5–34)
BUN: 16.5 mg/dL (ref 7.0–26.0)
CALCIUM: 9.7 mg/dL (ref 8.4–10.4)
CHLORIDE: 102 meq/L (ref 98–109)
CO2: 26 mEq/L (ref 22–29)
CREATININE: 0.7 mg/dL (ref 0.6–1.1)
EGFR: >90 mL/min/{1.73_m2} (ref >90)
Glucose: 76 mg/dl (ref 70–140)
Potassium: 4.2 mEq/L (ref 3.5–5.1)
Sodium: 139 mEq/L (ref 136–145)
Total Bilirubin: <0.22 mg/dL (ref 0.20–1.20)
Total Protein: 7.2 g/dL (ref 6.4–8.3)

## 2016-05-15 MED ORDER — SODIUM CHLORIDE 0.9 % IV SOLN
20.0000 mg | Freq: Once | INTRAVENOUS | Status: AC
  Administered 2016-05-15: 20 mg via INTRAVENOUS
  Filled 2016-05-15: qty 2

## 2016-05-15 MED ORDER — ACETAMINOPHEN 325 MG PO TABS
ORAL_TABLET | ORAL | Status: AC
  Filled 2016-05-15: qty 2

## 2016-05-15 MED ORDER — FAMOTIDINE IN NACL 20-0.9 MG/50ML-% IV SOLN
INTRAVENOUS | Status: AC
  Filled 2016-05-15: qty 50

## 2016-05-15 MED ORDER — ACETAMINOPHEN 325 MG PO TABS
650.0000 mg | ORAL_TABLET | Freq: Once | ORAL | Status: AC
  Administered 2016-05-15: 650 mg via ORAL

## 2016-05-15 MED ORDER — DIPHENHYDRAMINE HCL 50 MG/ML IJ SOLN
50.0000 mg | Freq: Once | INTRAMUSCULAR | Status: AC
  Administered 2016-05-15: 50 mg via INTRAVENOUS

## 2016-05-15 MED ORDER — MIRTAZAPINE 15 MG PO TABS: 15.0000 mg | ORAL_TABLET | Freq: Every day | ORAL | 1 refills | Status: DC

## 2016-05-15 MED ORDER — SODIUM CHLORIDE 0.9 % IV SOLN
403.5000 mg | Freq: Once | INTRAVENOUS | Status: AC
  Administered 2016-05-15: 400 mg via INTRAVENOUS
  Filled 2016-05-15: qty 40

## 2016-05-15 MED ORDER — PALONOSETRON HCL INJECTION 0.25 MG/5ML
0.2500 mg | Freq: Once | INTRAVENOUS | Status: AC
  Administered 2016-05-15: 0.25 mg via INTRAVENOUS

## 2016-05-15 MED ORDER — SODIUM CHLORIDE 0.9 % IV SOLN
Freq: Once | INTRAVENOUS | Status: AC
  Administered 2016-05-15: 10:00:00 via INTRAVENOUS

## 2016-05-15 MED ORDER — PEGFILGRASTIM 6 MG/0.6ML ~~LOC~~ PSKT
6.0000 mg | PREFILLED_SYRINGE | Freq: Once | SUBCUTANEOUS | Status: AC
  Administered 2016-05-15: 6 mg via SUBCUTANEOUS
  Filled 2016-05-15: qty 0.6

## 2016-05-15 MED ORDER — PALONOSETRON HCL INJECTION 0.25 MG/5ML
INTRAVENOUS | Status: AC
  Filled 2016-05-15: qty 5

## 2016-05-15 MED ORDER — DIPHENHYDRAMINE HCL 50 MG/ML IJ SOLN
INTRAMUSCULAR | Status: AC
  Filled 2016-05-15: qty 1

## 2016-05-15 MED ORDER — FAMOTIDINE IN NACL 20-0.9 MG/50ML-% IV SOLN
20.0000 mg | Freq: Once | INTRAVENOUS | Status: AC
  Administered 2016-05-15: 20 mg via INTRAVENOUS

## 2016-05-15 MED ORDER — PACLITAXEL CHEMO INJECTION 300 MG/50ML
175.0000 mg/m2 | Freq: Once | INTRAVENOUS | Status: AC
  Administered 2016-05-15: 306 mg via INTRAVENOUS
  Filled 2016-05-15: qty 51

## 2016-05-15 NOTE — Patient Instructions (Signed)
[facility] Discharge Instructions for Patients Receiving Chemotherapy  Today you received the following chemotherapy agents Taxol and Carboplatin. To help prevent nausea and vomiting after your treatment, we encourage you to take your nausea medication as directed.  If you develop nausea and vomiting that is not controlled by your nausea medication, call the clinic.   BELOW ARE SYMPTOMS THAT SHOULD BE REPORTED IMMEDIATELY:  *FEVER GREATER THAN 100.5 F  *CHILLS WITH OR WITHOUT FEVER  NAUSEA AND VOMITING THAT IS NOT CONTROLLED WITH YOUR NAUSEA MEDICATION  *UNUSUAL SHORTNESS OF BREATH  *UNUSUAL BRUISING OR BLEEDING  TENDERNESS IN MOUTH AND THROAT WITH OR WITHOUT PRESENCE OF ULCERS  *URINARY PROBLEMS  *BOWEL PROBLEMS  UNUSUAL RASH Items with * indicate a potential emergency and should be followed up as soon as possible.  Feel free to call the clinic you have any questions or concerns. The clinic phone number is [phone].  Please show the CHEMO ALERT CARD at check-in to the Emergency Department and triage nurse.    

## 2016-05-15 NOTE — Progress Notes (Signed)
[place] [phone]   [phone]  OFFICE PROGRESS NOTE  Barry Dienes, NP [place] [address]  DIAGNOSIS: Stage IIIA (T1b, N2, M0) non-small cell lung cancer presented with right middle lobe pulmonary nodule, mediastinal lymphadenopathy and highly suspicious for small nodule in the left upper lobe that could change her stage to stage IV that could present another synchronous primary lesion in the left upper lobe. This was diagnosed in September 2017.  PRIOR THERAPY: Concurrent chemoradiation with weekly carboplatin for AUC of 2 and paclitaxel 45 MG/M2 status post 6 cycles last dose was given 02/25/2016 with partial response.  CURRENT THERAPY: Consolidation chemotherapy with carboplatin for AUC of 5 and paclitaxel 175 MG/M2 every 3 weeks with Neulasta support. First dose 04/22/2016. Status post one cycle.  INTERVAL HISTORY: Courtney Zuniga 66 y.o. female came to the clinic today for follow-up visit. The patient tolerated the first cycle of her consolidation chemotherapy fairly well except for aching pain in the joint after the Neulasta injection. She also lost around 5 pounds since her last visit because of lack of appetite. She denied having any significant peripheral neuropathy. She has no nausea, vomiting, diarrhea or constipation. She has no fever or chills. She denied having any significant chest pain, shortness breath, cough or hemoptysis. She is here today for evaluation before starting cycle #2.  MEDICAL HISTORY: Past Medical History:  Diagnosis Date  . Anemia    as a young woman  . Arthritis    osteoartritis  . Asthma   . Brain tumor (benign) ([place]) 2005 [facility]   Benign  . Chronic headaches   . Chronic hip pain   . Chronic pain   . COPD (chronic obstructive pulmonary disease) ([place])   . Coronary artery disease   . Depression   . Encounter for antineoplastic chemotherapy 01/10/2016  . Hypertension   . Lung cancer ([place])     . NSTEMI (non-ST elevated myocardial infarction) ([facility] [facility])   . On home O2    qhs  . Pneumonia   . Shortness of breath dyspnea     ALLERGIES:  is allergic to no known allergies.  MEDICATIONS:  Current Outpatient Prescriptions  Medication Sig Dispense Refill  . albuterol (PROVENTIL HFA;VENTOLIN HFA) 108 (90 BASE) MCG/ACT inhaler Inhale 2 puffs into the lungs every 6 (six) hours as needed for shortness of breath.     Marland Kitchen albuterol (PROVENTIL) (2.5 MG/3ML) 0.083% nebulizer solution Take 2.5 mg by nebulization every 6 (six) hours as needed for wheezing or shortness of breath.    . Aspirin-Salicylamide-Caffeine (BC HEADACHE POWDER PO) Take 1 packet by mouth daily as needed (headache).    . calcium-vitamin D 250-100 MG-UNIT per tablet Take 1 tablet by mouth daily.    . Cholecalciferol (VITAMIN D3) 5000 units TABS Take 1 tablet by mouth daily.    . cyclobenzaprine (FLEXERIL) 10 MG tablet     . Fluticasone-Salmeterol (ADVAIR) 500-50 MCG/DOSE AEPB Inhale 1 puff into the lungs every 12 (twelve) hours.    Marland Kitchen HYDROcodone-acetaminophen (NORCO/VICODIN) 5-325 MG tablet Take 1-2 tablets by mouth every 6 (six) hours as needed. 20 tablet 0  . lidocaine (XYLOCAINE) 2 % solution Use as directed 10 mLs in the mouth or throat as needed for mouth pain (swallow 10 ml 30 minutes before a meal as needed.. Disp 250 cc. 0 refills.).     Marland Kitchen oxyCODONE-acetaminophen (PERCOCET/ROXICET) 5-325 MG tablet Take 1 tablet by mouth every 4 (four) hours as needed  for severe pain. 30 tablet 0  . pantoprazole (PROTONIX) 40 MG tablet Take 40 mg by mouth daily.    Marland Kitchen QVAR 80 MCG/ACT inhaler Inhale 1 puff into the lungs 2 (two) times daily as needed.     . sucralfate (CARAFATE) 1 g tablet 1 g 4 (four) times daily.     . Tiotropium Bromide-Olodaterol (STIOLTO RESPIMAT) 2.5-2.5 MCG/ACT AERS Inhale 1 puff into the lungs daily.    . nitroGLYCERIN (NITROSTAT) 0.4 MG SL tablet Place 0.4 mg under the tongue every 5 (five) minutes as needed for chest  pain.    Marland Kitchen prochlorperazine (COMPAZINE) 10 MG tablet Take 1 tablet (10 mg total) by mouth every 6 (six) hours as needed for nausea or vomiting. (Patient not taking: Reported on 05/15/2016) 30 tablet 0   No current facility-administered medications for this visit.     SURGICAL HISTORY:  Past Surgical History:  Procedure Laterality Date  . CHOLECYSTECTOMY    . COLONOSCOPY  2015   Results requested from [facility]  . COLONOSCOPY    . ESOPHAGOGASTRODUODENOSCOPY N/A 08/14/2015   Procedure: ESOPHAGOGASTRODUODENOSCOPY (EGD);  Surgeon: Daneil Dolin, MD;  [facility];  Service: Endoscopy;  Laterality: N/A;  215   . ESOPHAGOGASTRODUODENOSCOPY (EGD) WITH PROPOFOL N/A 09/13/2015   Procedure: ESOPHAGOGASTRODUODENOSCOPY (EGD) WITH PROPOFOL;  Surgeon: Milus Banister, MD;  [facility];  Service: Endoscopy;  Laterality: N/A;  . TUMOR REMOVAL  2005   Benign  . UPPER ESOPHAGEAL ENDOSCOPIC ULTRASOUND (EUS)  09/13/2015   Procedure: UPPER ESOPHAGEAL ENDOSCOPIC ULTRASOUND (EUS);  Surgeon: Milus Banister, MD;  Location: [facility];  Service: Endoscopy;;  . VIDEO BRONCHOSCOPY WITH ENDOBRONCHIAL NAVIGATION N/A 12/31/2015   Procedure: VIDEO BRONCHOSCOPY WITH ENDOBRONCHIAL NAVIGATION;  Surgeon: Melrose Nakayama, MD;  Location: [facility];  Service: Thoracic;  Laterality: N/A;  . VIDEO BRONCHOSCOPY WITH ENDOBRONCHIAL ULTRASOUND N/A 11/08/2015   Procedure: VIDEO BRONCHOSCOPY WITH ENDOBRONCHIAL ULTRASOUND;  Surgeon: Ivin Poot, MD;  [facility];  Service: Thoracic;  Laterality: N/A;    REVIEW OF SYSTEMS:  A comprehensive review of systems was negative except for: Constitutional: positive for fatigue Musculoskeletal: positive for arthralgias   PHYSICAL EXAMINATION: General appearance: alert, cooperative, fatigued and no distress Head: Normocephalic, without obvious abnormality, atraumatic Neck: no adenopathy, no JVD, supple, symmetrical, trachea midline and thyroid not  enlarged, symmetric, no tenderness/mass/nodules Lymph nodes: Cervical, supraclavicular, and axillary nodes normal. Resp: clear to auscultation bilaterally Back: symmetric, no curvature. ROM normal. No CVA tenderness. Cardio: regular rate and rhythm, S1, S2 normal, no murmur, click, rub or gallop GI: soft, non-tender; bowel sounds normal; no masses,  no organomegaly Extremities: extremities normal, atraumatic, no cyanosis or edema  ECOG PERFORMANCE STATUS: 1 - Symptomatic but completely ambulatory  Blood pressure 129/80, pulse (!) 102, temperature 98.5 F (36.9 C), temperature source Oral, resp. rate 18, height '5\' 7"'$  (1.702 m), weight 135 lb 3.2 oz (61.3 kg), SpO2 99 %.  LABORATORY DATA: Lab Results  Component Value Date   WBC 7.3 05/15/2016   HGB 13.6 05/15/2016   HCT 37.4 05/15/2016   MCV 82.9 05/15/2016   PLT 287 05/15/2016      Chemistry      Component Value Date/Time   NA 139 05/15/2016 0846   K 4.2 05/15/2016 0846   CL 102 04/29/2016 1032   CO2 26 05/15/2016 0846   BUN 16.5 05/15/2016 0846   CREATININE 0.7 05/15/2016 0846      Component Value Date/Time   CALCIUM  9.7 05/15/2016 0846   ALKPHOS 211 (H) 05/15/2016 0846   AST 15 05/15/2016 0846   ALT 10 05/15/2016 0846   BILITOT <0.22 05/15/2016 0846       RADIOGRAPHIC STUDIES: Dg Chest 2 View  Result Date: 04/29/2016 CLINICAL DATA:  Shortness of breath for months, worsening, history hypertension, asthma, coronary artery disease post NSTEMI, COPD, lung cancer, former smoker EXAM: CHEST  2 VIEW COMPARISON:  12/31/2015; correlation interval CT chest 04/08/2016 FINDINGS: Normal heart size, mediastinal contours, and pulmonary vascularity. Atherosclerotic calcification aorta with minimal aortic tortuosity. Emphysematous and minimal bronchitic changes consistent with COPD. Increased atelectasis at lung bases versus prior study particularly RIGHT middle lobe. No definite acute infiltrate, pleural effusion or pneumothorax.  BILATERAL nipple shadows again identified. Diffuse osseous demineralization. IMPRESSION: COPD changes with increased bibasilar atelectasis particularly in RIGHT middle lobe. Aortic atherosclerosis. Electronically Signed   By: Lavonia Dana M.D.   On: 04/29/2016 11:08   Ct Chest Wo Contrast  Result Date: 04/29/2016 CLINICAL DATA:  Chest pain, cough, and shortness of breath. Currently undergoing chemotherapy for right lung adenocarcinoma. Previous radiation therapy. EXAM: CT CHEST WITHOUT CONTRAST TECHNIQUE: Multidetector CT imaging of the chest was performed following the standard protocol without IV contrast. COMPARISON:  04/08/2016 FINDINGS: Cardiovascular: No acute findings. Aortic and coronary artery atherosclerosis. Mediastinum/Nodes: No masses or pathologically enlarged lymph nodes identified on this unenhanced exam. Lungs/Pleura: Moderate emphysema and bibasilar scarring remain stable. Increased right middle lobe volume loss seen with persistent central air bronchograms. This obscures previously seen right middle lobe nodule. No evidence of centrally obstructing mass on this unenhanced exam. Sub-cm nodular densities in the anterior left upper lobe and superior right lower lobe remain stable. No new or enlarging pulmonary nodules or masses identified. No evidence of pleural effusion. Upper Abdomen:  Unremarkable. Musculoskeletal:  No suspicious bone lesions. IMPRESSION: Increased right middle lobe atelectasis, which obscures previously seen right middle lobe pulmonary nodule. No evidence of centrally obstructing mass or lymphadenopathy. Other bilateral sub-cm pulmonary nodules remain stable. No new or progressive disease identified within the thorax. Stable moderate emphysema and bibasilar scarring. Aortic and coronary artery atherosclerosis. Electronically Signed   By: Earle Gell M.D.   On: 04/29/2016 13:41    ASSESSMENT AND PLAN:  This is a very pleasant 65 years old African-American female with a  stage IIIa non-small cell lung cancer status post concurrent chemoradiation with weekly carboplatin and paclitaxel. She is currently undergoing consolidation chemotherapy with carboplatin for AUC of 5 and paclitaxel 175 MG/M2 every 3 weeks with Neulasta support. She is status post 1 cycle. The patient is tolerating the treatment well with no significant complaints except for mild weight loss and aching pain after the Neulasta injection. For the weight loss and depression, I will start the patient on Remeron 15 mg by mouth daily at bedtime. She was advised to take her pain medication and Claritin after the Neulasta injection. I will see the patient back for follow-up visit in 3 weeks for evaluation. She was advised to call immediately if she has any concerning symptoms in the interval. The patient voices understanding of current disease status and treatment options and is in agreement with the current care plan.  All questions were answered. The patient knows to call the clinic with any problems, questions or concerns. We can certainly see the patient much sooner if necessary. I spent 10 minutes counseling the patient face to face. The total time spent in the appointment was 15 minutes.  Disclaimer: This note  was dictated with voice recognition software. Similar sounding words can inadvertently be transcribed and may not be corrected upon review.

## 2016-05-17 ENCOUNTER — Telehealth: Payer: Self-pay | Admitting: Internal Medicine

## 2016-05-17 NOTE — Telephone Encounter (Signed)
Appointments confirmed with patient, per 05/15/16 los. Patient will pick up updated copy of schedule at next visit on 06/05/16.

## 2016-05-17 NOTE — Telephone Encounter (Signed)
Message sent to chemo scheduler to be added, per 05/15/16 los.

## 2016-05-19 ENCOUNTER — Telehealth: Payer: Self-pay | Admitting: *Deleted

## 2016-05-19 NOTE — Telephone Encounter (Signed)
Per 2/2 LOS ans staff message I have scheduled appts and notified the scheduler

## 2016-05-20 ENCOUNTER — Other Ambulatory Visit: Payer: Medicare HMO

## 2016-05-22 ENCOUNTER — Telehealth: Payer: Self-pay | Admitting: Medical Oncology

## 2016-05-22 ENCOUNTER — Other Ambulatory Visit: Payer: Medicare HMO

## 2016-05-22 DIAGNOSIS — M898X9 Other specified disorders of bone, unspecified site: Secondary | ICD-10-CM

## 2016-05-22 MED ORDER — OXYCODONE-ACETAMINOPHEN 5-325 MG PO TABS: 1.0000 | ORAL_TABLET | Freq: Four times a day (QID) | ORAL | 0 refills | Status: DC | PRN

## 2016-05-22 NOTE — Telephone Encounter (Signed)
Pt and nurse notified rx ready for pickup

## 2016-05-22 NOTE — Telephone Encounter (Signed)
Pt requesting pain med -PCP will not fill . " Bones hurt all the time I tired  Alleve , ibuprophen , alcohol rubs and it does not work. ". received neulasta and hX OSTEOARTHRITIS. Pain intensified after chemo.

## 2016-05-27 ENCOUNTER — Other Ambulatory Visit: Payer: Medicare HMO

## 2016-05-29 ENCOUNTER — Other Ambulatory Visit: Payer: Medicare HMO

## 2016-06-03 ENCOUNTER — Ambulatory Visit: Payer: Medicare HMO

## 2016-06-03 ENCOUNTER — Other Ambulatory Visit: Payer: Medicare HMO

## 2016-06-05 ENCOUNTER — Other Ambulatory Visit (HOSPITAL_BASED_OUTPATIENT_CLINIC_OR_DEPARTMENT_OTHER): Payer: Medicare HMO

## 2016-06-05 ENCOUNTER — Encounter: Payer: Self-pay | Admitting: Internal Medicine

## 2016-06-05 ENCOUNTER — Ambulatory Visit (HOSPITAL_BASED_OUTPATIENT_CLINIC_OR_DEPARTMENT_OTHER): Payer: Medicare HMO

## 2016-06-05 ENCOUNTER — Telehealth: Payer: Self-pay | Admitting: Internal Medicine

## 2016-06-05 ENCOUNTER — Ambulatory Visit (HOSPITAL_BASED_OUTPATIENT_CLINIC_OR_DEPARTMENT_OTHER): Payer: Medicare HMO | Admitting: Internal Medicine

## 2016-06-05 VITALS — BP 116/103 | HR 96 | Resp 16

## 2016-06-05 VITALS — BP 122/100 | HR 105 | Temp 98.7°F | Resp 18 | Ht 67.0 in | Wt 137.3 lb

## 2016-06-05 DIAGNOSIS — R110 Nausea: Secondary | ICD-10-CM

## 2016-06-05 DIAGNOSIS — C342 Malignant neoplasm of middle lobe, bronchus or lung: Secondary | ICD-10-CM

## 2016-06-05 DIAGNOSIS — M898X9 Other specified disorders of bone, unspecified site: Secondary | ICD-10-CM

## 2016-06-05 DIAGNOSIS — Z5111 Encounter for antineoplastic chemotherapy: Secondary | ICD-10-CM

## 2016-06-05 DIAGNOSIS — C3491 Malignant neoplasm of unspecified part of right bronchus or lung: Secondary | ICD-10-CM

## 2016-06-05 DIAGNOSIS — R070 Pain in throat: Secondary | ICD-10-CM | POA: Diagnosis not present

## 2016-06-05 LAB — CBC WITH DIFFERENTIAL/PLATELET
BASO%: 0.8 % (ref 0.0–2.0)
BASOS ABS: 0 10*3/uL (ref 0.0–0.1)
EOS%: 1.3 % (ref 0.0–7.0)
Eosinophils Absolute: 0.1 10*3/uL (ref 0.0–0.5)
HEMATOCRIT: 37.2 % (ref 34.8–46.6)
HGB: 12.6 g/dL (ref 11.6–15.9)
LYMPH%: 14.7 % (ref 14.0–49.7)
MCH: 30.7 pg (ref 25.1–34.0)
MCHC: 34 g/dL (ref 31.5–36.0)
MCV: 90.4 fL (ref 79.5–101.0)
MONO#: 0.4 10*3/uL (ref 0.1–0.9)
MONO%: 9.6 % (ref 0.0–14.0)
NEUT#: 3.3 10*3/uL (ref 1.5–6.5)
NEUT%: 73.6 % (ref 38.4–76.8)
PLATELETS: 272 10*3/uL (ref 145–400)
RBC: 4.11 10*6/uL (ref 3.70–5.45)
RDW: 15.6 % — ABNORMAL HIGH (ref 11.2–14.5)
WBC: 4.5 10*3/uL (ref 3.9–10.3)
lymph#: 0.7 10*3/uL — ABNORMAL LOW (ref 0.9–3.3)

## 2016-06-05 LAB — COMPREHENSIVE METABOLIC PANEL
ALT: 11 U/L (ref 0–55)
ANION GAP: 10 meq/L (ref 3–11)
AST: 15 U/L (ref 5–34)
Albumin: 3.6 g/dL (ref 3.5–5.0)
Alkaline Phosphatase: 183 U/L — ABNORMAL HIGH (ref 40–150)
BUN: 13.7 mg/dL (ref 7.0–26.0)
CALCIUM: 9.8 mg/dL (ref 8.4–10.4)
CHLORIDE: 107 meq/L (ref 98–109)
CO2: 26 mEq/L (ref 22–29)
Creatinine: 0.6 mg/dL (ref 0.6–1.1)
EGFR: >90 mL/min/{1.73_m2} (ref >90)
Glucose: 81 mg/dl (ref 70–140)
POTASSIUM: 4.1 meq/L (ref 3.5–5.1)
Sodium: 143 mEq/L (ref 136–145)
Total Bilirubin: <0.22 mg/dL (ref 0.20–1.20)
Total Protein: 7 g/dL (ref 6.4–8.3)

## 2016-06-05 MED ORDER — MIRTAZAPINE 15 MG PO TABS: 15.0000 mg | ORAL_TABLET | Freq: Every day | ORAL | 1 refills | Status: DC

## 2016-06-05 MED ORDER — OXYCODONE-ACETAMINOPHEN 5-325 MG PO TABS: 1.0000 | ORAL_TABLET | Freq: Four times a day (QID) | ORAL | 0 refills | Status: DC | PRN

## 2016-06-05 MED ORDER — SODIUM CHLORIDE 0.9 % IV SOLN
Freq: Once | INTRAVENOUS | Status: AC
  Administered 2016-06-05: 12:00:00 via INTRAVENOUS

## 2016-06-05 MED ORDER — PALONOSETRON HCL INJECTION 0.25 MG/5ML
0.2500 mg | Freq: Once | INTRAVENOUS | Status: AC
  Administered 2016-06-05: 0.25 mg via INTRAVENOUS

## 2016-06-05 MED ORDER — FAMOTIDINE IN NACL 20-0.9 MG/50ML-% IV SOLN
20.0000 mg | Freq: Once | INTRAVENOUS | Status: AC
  Administered 2016-06-05: 20 mg via INTRAVENOUS

## 2016-06-05 MED ORDER — SUCRALFATE 1 G PO TABS: 1.0000 g | ORAL_TABLET | Freq: Four times a day (QID) | ORAL | 0 refills | Status: DC

## 2016-06-05 MED ORDER — DEXAMETHASONE SODIUM PHOSPHATE 100 MG/10ML IJ SOLN
20.0000 mg | Freq: Once | INTRAMUSCULAR | Status: AC
  Administered 2016-06-05: 20 mg via INTRAVENOUS
  Filled 2016-06-05: qty 2

## 2016-06-05 MED ORDER — DIPHENHYDRAMINE HCL 50 MG/ML IJ SOLN
INTRAMUSCULAR | Status: AC
  Filled 2016-06-05: qty 1

## 2016-06-05 MED ORDER — FAMOTIDINE IN NACL 20-0.9 MG/50ML-% IV SOLN
INTRAVENOUS | Status: AC
  Filled 2016-06-05: qty 50

## 2016-06-05 MED ORDER — DEXTROSE 5 % IV SOLN
175.0000 mg/m2 | Freq: Once | INTRAVENOUS | Status: AC
  Administered 2016-06-05: 306 mg via INTRAVENOUS
  Filled 2016-06-05: qty 51

## 2016-06-05 MED ORDER — DIPHENHYDRAMINE HCL 50 MG/ML IJ SOLN
50.0000 mg | Freq: Once | INTRAMUSCULAR | Status: AC
  Administered 2016-06-05: 50 mg via INTRAVENOUS

## 2016-06-05 MED ORDER — ACETAMINOPHEN 325 MG PO TABS
ORAL_TABLET | ORAL | Status: AC
  Filled 2016-06-05: qty 2

## 2016-06-05 MED ORDER — SODIUM CHLORIDE 0.9 % IV SOLN
403.5000 mg | Freq: Once | INTRAVENOUS | Status: AC
  Administered 2016-06-05: 400 mg via INTRAVENOUS
  Filled 2016-06-05: qty 40

## 2016-06-05 MED ORDER — PALONOSETRON HCL INJECTION 0.25 MG/5ML
INTRAVENOUS | Status: AC
  Filled 2016-06-05: qty 5

## 2016-06-05 MED ORDER — PEGFILGRASTIM 6 MG/0.6ML ~~LOC~~ PSKT
6.0000 mg | PREFILLED_SYRINGE | Freq: Once | SUBCUTANEOUS | Status: AC
  Administered 2016-06-05: 6 mg via SUBCUTANEOUS
  Filled 2016-06-05: qty 0.6

## 2016-06-05 NOTE — Patient Instructions (Signed)
[facility] Discharge Instructions for Patients Receiving Chemotherapy  Today you received the following chemotherapy agents Taxol and Carboplatin  To help prevent nausea and vomiting after your treatment, we encourage you to take your nausea medication     If you develop nausea and vomiting that is not controlled by your nausea medication, call the clinic.   BELOW ARE SYMPTOMS THAT SHOULD BE REPORTED IMMEDIATELY:  *FEVER GREATER THAN 100.5 F  *CHILLS WITH OR WITHOUT FEVER  NAUSEA AND VOMITING THAT IS NOT CONTROLLED WITH YOUR NAUSEA MEDICATION  *UNUSUAL SHORTNESS OF BREATH  *UNUSUAL BRUISING OR BLEEDING  TENDERNESS IN MOUTH AND THROAT WITH OR WITHOUT PRESENCE OF ULCERS  *URINARY PROBLEMS  *BOWEL PROBLEMS  UNUSUAL RASH Items with * indicate a potential emergency and should be followed up as soon as possible.  Feel free to call the clinic you have any questions or concerns. The clinic phone number is [phone].  Please show the CHEMO ALERT CARD at check-in to the Emergency Department and triage nurse.   

## 2016-06-05 NOTE — Progress Notes (Signed)
[place] [phone]   Fax:(336) [phone]  OFFICE PROGRESS NOTE  Barry Dienes, NP [place] [address]  DIAGNOSIS: Stage IIIA (T1b, N2, M0) non-small cell lung cancer presented with right middle lobe pulmonary nodule, mediastinal lymphadenopathy and highly suspicious for small nodule in the left upper lobe that could change her stage to stage IV that could present another synchronous primary lesion in the left upper lobe. This was diagnosed in September 2017.  PRIOR THERAPY: Concurrent chemoradiation with weekly carboplatin for AUC of 2 and paclitaxel 45 MG/M2 status post 6 cycles last dose was given 02/25/2016 with partial response.  CURRENT THERAPY: Consolidation chemotherapy with carboplatin for AUC of 5 and paclitaxel 175 MG/M2 every 3 weeks with Neulasta support. First dose 04/22/2016. Status post 2 cycles.  INTERVAL HISTORY: Courtney Zuniga 66 y.o. female came to the clinic today for follow-up visit. The patient is currently undergoing treatment with consolidation chemotherapy with carboplatin and paclitaxel status post 2 cycles. The patient is tolerating her treatment well with no significant adverse effects except for mild nausea. She denied having any significant chest pain, shortness of breath, cough or hemoptysis. She continues to have mild sore throat especially in the morning and she is requesting refill  ofCarafate. She has no significant weight loss or night sweats. She has no fever or chills. The patient is here today for evaluation before starting cycle #3 of her treatment.   MEDICAL HISTORY: Past Medical History:  Diagnosis Date  . Anemia    as a young woman  . Arthritis    osteoartritis  . Asthma   . Brain tumor (benign) ([place]) 2005 [facility]   Benign  . Chronic headaches   . Chronic hip pain   . Chronic pain   . COPD (chronic obstructive pulmonary disease) ([place])   . Coronary artery disease   . Depression   . Depression  05/15/2016  . Encounter for antineoplastic chemotherapy 01/10/2016  . Hypertension   . Lung cancer ([place])   . NSTEMI (non-ST elevated myocardial infarction) ([place])   . On home O2    qhs  . Pneumonia   . Shortness of breath dyspnea     ALLERGIES:  is allergic to no known allergies.  MEDICATIONS:  Current Outpatient Prescriptions  Medication Sig Dispense Refill  . albuterol (PROVENTIL HFA;VENTOLIN HFA) 108 (90 BASE) MCG/ACT inhaler Inhale 2 puffs into the lungs every 6 (six) hours as needed for shortness of breath.     Marland Kitchen albuterol (PROVENTIL) (2.5 MG/3ML) 0.083% nebulizer solution Take 2.5 mg by nebulization every 6 (six) hours as needed for wheezing or shortness of breath.    . Aspirin-Salicylamide-Caffeine (BC HEADACHE POWDER PO) Take 1 packet by mouth daily as needed (headache).    . calcium-vitamin D 250-100 MG-UNIT per tablet Take 1 tablet by mouth daily.    . Cholecalciferol (VITAMIN D3) 5000 units TABS Take 1 tablet by mouth daily.    . cyclobenzaprine (FLEXERIL) 10 MG tablet     . Fluticasone-Salmeterol (ADVAIR) 500-50 MCG/DOSE AEPB Inhale 1 puff into the lungs every 12 (twelve) hours.    . lidocaine (XYLOCAINE) 2 % solution Use as directed 10 mLs in the mouth or throat as needed for mouth pain (swallow 10 ml 30 minutes before a meal as needed.. Disp 250 cc. 0 refills.).     Marland Kitchen mirtazapine (REMERON) 15 MG tablet Take 1 tablet (15 mg total) by mouth at bedtime. 30 tablet 1  . oxyCODONE-acetaminophen (  PERCOCET/ROXICET) 5-325 MG tablet Take 1 tablet by mouth every 6 (six) hours as needed for severe pain. 30 tablet 0  . pantoprazole (PROTONIX) 40 MG tablet Take 40 mg by mouth daily.    Marland Kitchen QVAR 80 MCG/ACT inhaler Inhale 1 puff into the lungs 2 (two) times daily as needed.     . sucralfate (CARAFATE) 1 g tablet 1 g 4 (four) times daily.     . Tiotropium Bromide-Olodaterol (STIOLTO RESPIMAT) 2.5-2.5 MCG/ACT AERS Inhale 1 puff into the lungs daily.    . nitroGLYCERIN (NITROSTAT) 0.4 MG SL tablet  Place 0.4 mg under the tongue every 5 (five) minutes as needed for chest pain.    Marland Kitchen prochlorperazine (COMPAZINE) 10 MG tablet Take 1 tablet (10 mg total) by mouth every 6 (six) hours as needed for nausea or vomiting. (Patient not taking: Reported on 05/15/2016) 30 tablet 0   No current facility-administered medications for this visit.     SURGICAL HISTORY:  Past Surgical History:  Procedure Laterality Date  . CHOLECYSTECTOMY    . COLONOSCOPY  2015   Results requested from [facility]  . COLONOSCOPY    . ESOPHAGOGASTRODUODENOSCOPY N/A 08/14/2015   Procedure: ESOPHAGOGASTRODUODENOSCOPY (EGD);  Surgeon: Daneil Dolin, MD;  [facility];  Service: Endoscopy;  Laterality: N/A;  215   . ESOPHAGOGASTRODUODENOSCOPY (EGD) WITH PROPOFOL N/A 09/13/2015   Procedure: ESOPHAGOGASTRODUODENOSCOPY (EGD) WITH PROPOFOL;  Surgeon: Milus Banister, MD;  [facility];  Service: Endoscopy;  Laterality: N/A;  . TUMOR REMOVAL  2005   Benign  . UPPER ESOPHAGEAL ENDOSCOPIC ULTRASOUND (EUS)  09/13/2015   Procedure: UPPER ESOPHAGEAL ENDOSCOPIC ULTRASOUND (EUS);  Surgeon: Milus Banister, MD;  Location: [facility];  Service: Endoscopy;;  . VIDEO BRONCHOSCOPY WITH ENDOBRONCHIAL NAVIGATION N/A 12/31/2015   Procedure: VIDEO BRONCHOSCOPY WITH ENDOBRONCHIAL NAVIGATION;  Surgeon: Melrose Nakayama, MD;  Location: [facility];  Service: Thoracic;  Laterality: N/A;  . VIDEO BRONCHOSCOPY WITH ENDOBRONCHIAL ULTRASOUND N/A 11/08/2015   Procedure: VIDEO BRONCHOSCOPY WITH ENDOBRONCHIAL ULTRASOUND;  Surgeon: Ivin Poot, MD;  Location: [facility];  Service: Thoracic;  Laterality: N/A;    REVIEW OF SYSTEMS:  A comprehensive review of systems was negative except for: Constitutional: positive for fatigue Ears, nose, mouth, throat, and face: positive for sore throat Musculoskeletal: positive for arthralgias   PHYSICAL EXAMINATION: General appearance: alert, cooperative, fatigued and no distress Head:  Normocephalic, without obvious abnormality, atraumatic Neck: no adenopathy, no JVD, supple, symmetrical, trachea midline and thyroid not enlarged, symmetric, no tenderness/mass/nodules Lymph nodes: Cervical, supraclavicular, and axillary nodes normal. Resp: clear to auscultation bilaterally Back: symmetric, no curvature. ROM normal. No CVA tenderness. Cardio: regular rate and rhythm, S1, S2 normal, no murmur, click, rub or gallop GI: soft, non-tender; bowel sounds normal; no masses,  no organomegaly Extremities: extremities normal, atraumatic, no cyanosis or edema  ECOG PERFORMANCE STATUS: 1 - Symptomatic but completely ambulatory  Blood pressure (!) 122/100, pulse (!) 105, temperature 98.7 F (37.1 C), temperature source Oral, resp. rate 18, height '5\' 7"'$  (1.702 m), weight 137 lb 4.8 oz (62.3 kg), SpO2 100 %.  LABORATORY DATA: Lab Results  Component Value Date   WBC 4.5 06/05/2016   HGB 12.6 06/05/2016   HCT 37.2 06/05/2016   MCV 90.4 06/05/2016   PLT 272 06/05/2016      Chemistry      Component Value Date/Time   NA 143 06/05/2016 0932   K 4.1 06/05/2016 0932   CL 102 04/29/2016 1032   CO2  26 06/05/2016 0932   BUN 13.7 06/05/2016 0932   CREATININE 0.6 06/05/2016 0932      Component Value Date/Time   CALCIUM 9.8 06/05/2016 0932   ALKPHOS 183 (H) 06/05/2016 0932   AST 15 06/05/2016 0932   ALT 11 06/05/2016 0932   BILITOT <0.22 06/05/2016 0932       RADIOGRAPHIC STUDIES: No results found.  ASSESSMENT AND PLAN:  This is a very pleasant 66 years old African-American female with stage IIIa non-small cell lung cancer status post concurrent chemoradiation with weekly carboplatin and paclitaxel and she is currently undergoing consolidation chemotherapy with carboplatin and paclitaxel status post 2 cycles. She is tolerating her treatment well with no significant adverse effects except for mild nausea. I recommended for the patient to proceed with cycle #3 today as a  scheduled. I will see her back for follow-up visit in one month for reevaluation with repeat CT scan of the chest for restaging of her disease. I gave the patient refill today for Carafate, Remeron and Percocet. She was advised to call immediately if she has any concerning symptoms in the interval. The patient voices understanding of current disease status and treatment options and is in agreement with the current care plan.  All questions were answered. The patient knows to call the clinic with any problems, questions or concerns. We can certainly see the patient much sooner if necessary. I spent 10 minutes counseling the patient face to face. The total time spent in the appointment was 15 minutes.   Disclaimer: This note was dictated with voice recognition software. Similar sounding words can inadvertently be transcribed and may not be corrected upon review.

## 2016-06-05 NOTE — Telephone Encounter (Signed)
Appointments scheduled per 2/22 LOS. Patient given AVS report and calendars with future scheduled appointments. °

## 2016-06-12 ENCOUNTER — Encounter: Payer: Self-pay | Admitting: *Deleted

## 2016-06-12 ENCOUNTER — Other Ambulatory Visit: Payer: Medicare HMO

## 2016-06-12 NOTE — Progress Notes (Signed)
QI encounter 

## 2016-06-16 ENCOUNTER — Ambulatory Visit: Payer: Medicare HMO | Admitting: Internal Medicine

## 2016-06-19 ENCOUNTER — Other Ambulatory Visit: Payer: Self-pay | Admitting: Internal Medicine

## 2016-06-19 ENCOUNTER — Other Ambulatory Visit (HOSPITAL_BASED_OUTPATIENT_CLINIC_OR_DEPARTMENT_OTHER): Payer: Medicare HMO

## 2016-06-19 DIAGNOSIS — C342 Malignant neoplasm of middle lobe, bronchus or lung: Secondary | ICD-10-CM

## 2016-06-19 DIAGNOSIS — C3491 Malignant neoplasm of unspecified part of right bronchus or lung: Secondary | ICD-10-CM

## 2016-06-19 LAB — CBC WITH DIFFERENTIAL/PLATELET
BASO%: 0.3 % (ref 0.0–2.0)
Basophils Absolute: 0 10*3/uL (ref 0.0–0.1)
EOS ABS: 0.1 10*3/uL (ref 0.0–0.5)
EOS%: 1.2 % (ref 0.0–7.0)
HCT: 36.8 % (ref 34.8–46.6)
HEMOGLOBIN: 12.6 g/dL (ref 11.6–15.9)
LYMPH%: 11.8 % — ABNORMAL LOW (ref 14.0–49.7)
MCH: 29.6 pg (ref 25.1–34.0)
MCHC: 34.2 g/dL (ref 31.5–36.0)
MCV: 86.6 fL (ref 79.5–101.0)
MONO#: 0.4 10*3/uL (ref 0.1–0.9)
MONO%: 5.4 % (ref 0.0–14.0)
NEUT%: 81.3 % — ABNORMAL HIGH (ref 38.4–76.8)
NEUTROS ABS: 6 10*3/uL (ref 1.5–6.5)
Platelets: 298 10*3/uL (ref 145–400)
RBC: 4.25 10*6/uL (ref 3.70–5.45)
RDW: 15.4 % — AB (ref 11.2–14.5)
WBC: 7.4 10*3/uL (ref 3.9–10.3)
lymph#: 0.9 10*3/uL (ref 0.9–3.3)

## 2016-06-19 LAB — COMPREHENSIVE METABOLIC PANEL
ALBUMIN: 3.6 g/dL (ref 3.5–5.0)
ALK PHOS: 183 U/L — AB (ref 40–150)
ALT: 12 U/L (ref 0–55)
AST: 15 U/L (ref 5–34)
Anion Gap: 9 mEq/L (ref 3–11)
BUN: 11.1 mg/dL (ref 7.0–26.0)
CO2: 27 meq/L (ref 22–29)
CREATININE: 0.7 mg/dL (ref 0.6–1.1)
Calcium: 10.1 mg/dL (ref 8.4–10.4)
Chloride: 107 mEq/L (ref 98–109)
GLUCOSE: 80 mg/dL (ref 70–140)
Potassium: 4.7 mEq/L (ref 3.5–5.1)
Sodium: 143 mEq/L (ref 136–145)
TOTAL PROTEIN: 7.4 g/dL (ref 6.4–8.3)

## 2016-06-25 ENCOUNTER — Telehealth: Payer: Self-pay | Admitting: *Deleted

## 2016-06-25 NOTE — Telephone Encounter (Signed)
Pt CT Scan has not been authorized or scheduled, lab/MD/Infusion appt will need to be cancelled Per Dr. Julien Nordmann. Pt notified of appts being cancelled due to no scan. Discussed with pt she will need a CT scan to determine treatment plan. She will then follow up with MD to go over results and discuss options.  Message to managed care regarding authorization status.  Message to scheduling to cancel 3/15 appts and  r/s lab/MD/Infusion for 2-3 days following her CT Scan.

## 2016-06-26 ENCOUNTER — Other Ambulatory Visit: Payer: Medicare HMO

## 2016-06-26 ENCOUNTER — Ambulatory Visit: Payer: Medicare HMO

## 2016-06-26 ENCOUNTER — Ambulatory Visit: Payer: Medicare HMO | Admitting: Internal Medicine

## 2016-06-26 ENCOUNTER — Telehealth: Payer: Self-pay | Admitting: Internal Medicine

## 2016-06-26 NOTE — Telephone Encounter (Signed)
Spoke with patient re new appointments for lab/fu 3/28. Also confirmed 3/22 lab/ct.

## 2016-07-03 ENCOUNTER — Encounter (HOSPITAL_COMMUNITY): Payer: Self-pay

## 2016-07-03 ENCOUNTER — Ambulatory Visit: Payer: Medicare HMO

## 2016-07-03 ENCOUNTER — Other Ambulatory Visit: Payer: Medicare HMO

## 2016-07-03 ENCOUNTER — Ambulatory Visit: Payer: Medicare HMO | Admitting: Internal Medicine

## 2016-07-03 ENCOUNTER — Ambulatory Visit (HOSPITAL_COMMUNITY)
Admission: RE | Admit: 2016-07-03 | Discharge: 2016-07-03 | Disposition: A | Discharge status: Home / Self Care | Payer: Medicare HMO | Source: Ambulatory Visit | Attending: Internal Medicine | Admitting: Internal Medicine

## 2016-07-03 ENCOUNTER — Other Ambulatory Visit (HOSPITAL_BASED_OUTPATIENT_CLINIC_OR_DEPARTMENT_OTHER): Payer: Medicare HMO

## 2016-07-03 DIAGNOSIS — C342 Malignant neoplasm of middle lobe, bronchus or lung: Secondary | ICD-10-CM

## 2016-07-03 DIAGNOSIS — R911 Solitary pulmonary nodule: Secondary | ICD-10-CM | POA: Insufficient documentation

## 2016-07-03 DIAGNOSIS — J439 Emphysema, unspecified: Secondary | ICD-10-CM | POA: Diagnosis not present

## 2016-07-03 DIAGNOSIS — C3491 Malignant neoplasm of unspecified part of right bronchus or lung: Secondary | ICD-10-CM | POA: Diagnosis not present

## 2016-07-03 DIAGNOSIS — I700 Atherosclerosis of aorta: Secondary | ICD-10-CM | POA: Diagnosis not present

## 2016-07-03 DIAGNOSIS — I2510 Atherosclerotic heart disease of native coronary artery without angina pectoris: Secondary | ICD-10-CM | POA: Diagnosis not present

## 2016-07-03 DIAGNOSIS — M898X9 Other specified disorders of bone, unspecified site: Secondary | ICD-10-CM | POA: Insufficient documentation

## 2016-07-03 LAB — CBC WITH DIFFERENTIAL/PLATELET
BASO%: 0.2 % (ref 0.0–2.0)
Basophils Absolute: 0 10*3/uL (ref 0.0–0.1)
EOS%: 2.4 % (ref 0.0–7.0)
Eosinophils Absolute: 0.1 10*3/uL (ref 0.0–0.5)
HCT: 38 % (ref 34.8–46.6)
HGB: 13.2 g/dL (ref 11.6–15.9)
LYMPH%: 12.8 % — AB (ref 14.0–49.7)
MCH: 29.9 pg (ref 25.1–34.0)
MCHC: 34.7 g/dL (ref 31.5–36.0)
MCV: 86 fL (ref 79.5–101.0)
MONO#: 0.6 10*3/uL (ref 0.1–0.9)
MONO%: 9.5 % (ref 0.0–14.0)
NEUT#: 4.4 10*3/uL (ref 1.5–6.5)
NEUT%: 75.1 % (ref 38.4–76.8)
Platelets: 257 10*3/uL (ref 145–400)
RBC: 4.42 10*6/uL (ref 3.70–5.45)
RDW: 15.7 % — ABNORMAL HIGH (ref 11.2–14.5)
WBC: 5.9 10*3/uL (ref 3.9–10.3)
lymph#: 0.8 10*3/uL — ABNORMAL LOW (ref 0.9–3.3)

## 2016-07-03 LAB — COMPREHENSIVE METABOLIC PANEL
ALT: 10 U/L (ref 0–55)
ANION GAP: 11 meq/L (ref 3–11)
AST: 15 U/L (ref 5–34)
Albumin: 3.7 g/dL (ref 3.5–5.0)
Alkaline Phosphatase: 191 U/L — ABNORMAL HIGH (ref 40–150)
BUN: 13.6 mg/dL (ref 7.0–26.0)
CHLORIDE: 107 meq/L (ref 98–109)
CO2: 26 meq/L (ref 22–29)
CREATININE: 0.7 mg/dL (ref 0.6–1.1)
Calcium: 9.8 mg/dL (ref 8.4–10.4)
EGFR: >90 mL/min/{1.73_m2} (ref >90)
Glucose: 81 mg/dl (ref 70–140)
Potassium: 3.8 mEq/L (ref 3.5–5.1)
Sodium: 144 mEq/L (ref 136–145)
Total Bilirubin: 0.23 mg/dL (ref 0.20–1.20)
Total Protein: 7.1 g/dL (ref 6.4–8.3)

## 2016-07-03 MED ORDER — IOPAMIDOL (ISOVUE-300) INJECTION 61%
75.0000 mL | Freq: Once | INTRAVENOUS | Status: AC | PRN
  Administered 2016-07-03: 75 mL via INTRAVENOUS

## 2016-07-03 MED ORDER — IOPAMIDOL (ISOVUE-300) INJECTION 61%
INTRAVENOUS | Status: AC
  Filled 2016-07-03: qty 75

## 2016-07-09 ENCOUNTER — Other Ambulatory Visit: Payer: Medicare HMO

## 2016-07-09 ENCOUNTER — Ambulatory Visit: Payer: Medicare HMO | Admitting: Internal Medicine

## 2016-07-10 ENCOUNTER — Telehealth: Payer: Self-pay | Admitting: Internal Medicine

## 2016-07-10 ENCOUNTER — Other Ambulatory Visit: Payer: Medicare HMO

## 2016-07-10 ENCOUNTER — Ambulatory Visit (HOSPITAL_BASED_OUTPATIENT_CLINIC_OR_DEPARTMENT_OTHER): Payer: Medicare HMO | Admitting: Internal Medicine

## 2016-07-10 ENCOUNTER — Encounter: Payer: Self-pay | Admitting: Internal Medicine

## 2016-07-10 DIAGNOSIS — C342 Malignant neoplasm of middle lobe, bronchus or lung: Secondary | ICD-10-CM | POA: Diagnosis not present

## 2016-07-10 DIAGNOSIS — R630 Anorexia: Secondary | ICD-10-CM

## 2016-07-10 DIAGNOSIS — C3491 Malignant neoplasm of unspecified part of right bronchus or lung: Secondary | ICD-10-CM

## 2016-07-10 DIAGNOSIS — R52 Pain, unspecified: Secondary | ICD-10-CM

## 2016-07-10 DIAGNOSIS — M898X9 Other specified disorders of bone, unspecified site: Secondary | ICD-10-CM

## 2016-07-10 MED ORDER — OXYCODONE-ACETAMINOPHEN 5-325 MG PO TABS: 1.0000 | ORAL_TABLET | Freq: Four times a day (QID) | ORAL | 0 refills | Status: DC | PRN

## 2016-07-10 NOTE — Telephone Encounter (Signed)
Appointments scheduled per 3.29.18 LOS. Patient given AVS report and calendars with future scheduled appointments. °

## 2016-07-10 NOTE — Progress Notes (Signed)
[facility] Telephone:([phone]   Fax:(336) [phone]  OFFICE PROGRESS NOTE  Barry Dienes, NP [place] [address]  DIAGNOSIS: Stage IIIA (T1b, N2, M0) non-small cell lung cancer presented with right middle lobe pulmonary nodule, mediastinal lymphadenopathy and highly suspicious for small nodule in the left upper lobe that could change her stage to stage IV that could present another synchronous primary lesion in the left upper lobe. This was diagnosed in September 2017.  PRIOR THERAPY:  1) Concurrent chemoradiation with weekly carboplatin for AUC of 2 and paclitaxel 45 MG/M2 status post 6 cycles last dose was given 02/25/2016 with partial response. 2) Consolidation chemotherapy with carboplatin for AUC of 5 and paclitaxel 175 MG/M2 every 3 weeks with Neulasta support. First dose 04/22/2016. Status post 3 cycles.  CURRENT THERAPY: Observation.  INTERVAL HISTORY: Courtney Zuniga 66 y.o. female returns to the clinic today for follow-up visit. The patient completed a course of consolidation chemotherapy with carboplatin and paclitaxel and tolerated her treatment well. She continues to complain of aching pain in the lower extremities and she is currently on Percocet for pain management. She denied having any significant chest pain, shortness of breath, cough or hemoptysis. She has no fever or chills. She denied having any weight loss or night sweats. She has no nausea or vomiting. She denied having any visual changes but has occasional headache. She had repeat CT scan of the chest performed recently and she is here for evaluation and discussion of her scan results.   MEDICAL HISTORY: Past Medical History:  Diagnosis Date  . Anemia    as a young woman  . Arthritis    osteoartritis  . Asthma   . Brain tumor (benign) ([place]) 2005 [facility]   Benign  . Chronic headaches   . Chronic hip pain   . Chronic pain   . COPD (chronic obstructive pulmonary disease)  ([place])   . Coronary artery disease   . Depression   . Depression 05/15/2016  . Encounter for antineoplastic chemotherapy 01/10/2016  . Hypertension   . Lung cancer ([place])   . NSTEMI (non-ST elevated myocardial infarction) ([place])   . On home O2    qhs  . Pneumonia   . Shortness of breath dyspnea     ALLERGIES:  is allergic to no known allergies.  MEDICATIONS:  Current Outpatient Prescriptions  Medication Sig Dispense Refill  . albuterol (PROVENTIL HFA;VENTOLIN HFA) 108 (90 BASE) MCG/ACT inhaler Inhale 2 puffs into the lungs every 6 (six) hours as needed for shortness of breath.     Marland Kitchen albuterol (PROVENTIL) (2.5 MG/3ML) 0.083% nebulizer solution Take 2.5 mg by nebulization every 6 (six) hours as needed for wheezing or shortness of breath.    . Aspirin-Salicylamide-Caffeine (BC HEADACHE POWDER PO) Take 1 packet by mouth daily as needed (headache).    . calcium-vitamin D 250-100 MG-UNIT per tablet Take 1 tablet by mouth daily.    . Cholecalciferol (VITAMIN D3) 5000 units TABS Take 1 tablet by mouth daily.    . cyclobenzaprine (FLEXERIL) 10 MG tablet     . Fluticasone-Salmeterol (ADVAIR) 500-50 MCG/DOSE AEPB Inhale 1 puff into the lungs every 12 (twelve) hours.    . lidocaine (XYLOCAINE) 2 % solution Use as directed 10 mLs in the mouth or throat as needed for mouth pain (swallow 10 ml 30 minutes before a meal as needed.. Disp 250 cc. 0 refills.).     Marland Kitchen mirtazapine (REMERON) 15 MG tablet Take 1  tablet (15 mg total) by mouth at bedtime. 30 tablet 1  . mirtazapine (REMERON) 15 MG tablet TAKE ONE TABLET BY MOUTH AT BEDTIME. 30 tablet 0  . nitroGLYCERIN (NITROSTAT) 0.4 MG SL tablet Place 0.4 mg under the tongue every 5 (five) minutes as needed for chest pain.    Marland Kitchen oxyCODONE-acetaminophen (PERCOCET/ROXICET) 5-325 MG tablet Take 1 tablet by mouth every 6 (six) hours as needed for severe pain. 30 tablet 0  . pantoprazole (PROTONIX) 40 MG tablet Take 40 mg by mouth daily.    . prochlorperazine (COMPAZINE)  10 MG tablet Take 1 tablet (10 mg total) by mouth every 6 (six) hours as needed for nausea or vomiting. (Patient not taking: Reported on 05/15/2016) 30 tablet 0  . QVAR 80 MCG/ACT inhaler Inhale 1 puff into the lungs 2 (two) times daily as needed.     . sucralfate (CARAFATE) 1 g tablet Take 1 tablet (1 g total) by mouth 4 (four) times daily. 12 tablet 0  . Tiotropium Bromide-Olodaterol (STIOLTO RESPIMAT) 2.5-2.5 MCG/ACT AERS Inhale 1 puff into the lungs daily.     No current facility-administered medications for this visit.     SURGICAL HISTORY:  Past Surgical History:  Procedure Laterality Date  . CHOLECYSTECTOMY    . COLONOSCOPY  2015   [facility]  . COLONOSCOPY    . ESOPHAGOGASTRODUODENOSCOPY N/A 08/14/2015   Procedure: ESOPHAGOGASTRODUODENOSCOPY (EGD);  Surgeon: Daneil Dolin, MD;  [facility];  Service: Endoscopy;  Laterality: N/A;  215   . ESOPHAGOGASTRODUODENOSCOPY (EGD) WITH PROPOFOL N/A 09/13/2015   Procedure: ESOPHAGOGASTRODUODENOSCOPY (EGD) WITH PROPOFOL;  Surgeon: Milus Banister, MD;  [facility];  Service: Endoscopy;  Laterality: N/A;  . TUMOR REMOVAL  2005   Benign  . UPPER ESOPHAGEAL ENDOSCOPIC ULTRASOUND (EUS)  09/13/2015   Procedure: UPPER ESOPHAGEAL ENDOSCOPIC ULTRASOUND (EUS);  Surgeon: Milus Banister, MD;  Location: [facility];  Service: Endoscopy;;  . VIDEO BRONCHOSCOPY WITH ENDOBRONCHIAL NAVIGATION N/A 12/31/2015   Procedure: VIDEO BRONCHOSCOPY WITH ENDOBRONCHIAL NAVIGATION;  Surgeon: Melrose Nakayama, MD;  Location: [facility];  Service: Thoracic;  Laterality: N/A;  . VIDEO BRONCHOSCOPY WITH ENDOBRONCHIAL ULTRASOUND N/A 11/08/2015   Procedure: VIDEO BRONCHOSCOPY WITH ENDOBRONCHIAL ULTRASOUND;  Surgeon: Ivin Poot, MD;  Location: [facility];  Service: Thoracic;  Laterality: N/A;    REVIEW OF SYSTEMS:  Constitutional: negative Eyes: negative Ears, nose, mouth, throat, and face: negative Respiratory:  negative Cardiovascular: negative Gastrointestinal: negative Genitourinary:negative Integument/breast: negative Hematologic/lymphatic: negative Musculoskeletal:positive for arthralgias Neurological: negative Behavioral/Psych: negative Endocrine: negative Allergic/Immunologic: negative   PHYSICAL EXAMINATION: General appearance: alert, cooperative and no distress Head: Normocephalic, without obvious abnormality, atraumatic Neck: no adenopathy, no JVD, supple, symmetrical, trachea midline and thyroid not enlarged, symmetric, no tenderness/mass/nodules Lymph nodes: Cervical, supraclavicular, and axillary nodes normal. Resp: clear to auscultation bilaterally Back: symmetric, no curvature. ROM normal. No CVA tenderness. Cardio: regular rate and rhythm, S1, S2 normal, no murmur, click, rub or gallop GI: soft, non-tender; bowel sounds normal; no masses,  no organomegaly Extremities: extremities normal, atraumatic, no cyanosis or edema Neurologic: Alert and oriented X 3, normal strength and tone. Normal symmetric reflexes. Normal coordination and gait  ECOG PERFORMANCE STATUS: 1 - Symptomatic but completely ambulatory  Blood pressure 110/76, pulse (!) 52, temperature 98.4 F (36.9 C), temperature source Oral, resp. rate 20, weight 137 lb 11.2 oz (62.5 kg), SpO2 100 %.  LABORATORY DATA: Lab Results  Component Value Date   WBC 5.9 07/03/2016  HGB 13.2 07/03/2016   HCT 38.0 07/03/2016   MCV 86.0 07/03/2016   PLT 257 07/03/2016      Chemistry      Component Value Date/Time   NA 144 07/03/2016 0848   K 3.8 07/03/2016 0848   CL 102 04/29/2016 1032   CO2 26 07/03/2016 0848   BUN 13.6 07/03/2016 0848   CREATININE 0.7 07/03/2016 0848      Component Value Date/Time   CALCIUM 9.8 07/03/2016 0848   ALKPHOS 191 (H) 07/03/2016 0848   AST 15 07/03/2016 0848   ALT 10 07/03/2016 0848   BILITOT 0.23 07/03/2016 0848       RADIOGRAPHIC STUDIES: Ct Chest W Contrast  Result Date:  07/03/2016 CLINICAL DATA:  Stage IIIA right middle lobe lung adenocarcinoma diagnosed September 2017, status post concurrent chemoradiation therapy completed 03/04/2016, with ongoing consolidation chemotherapy. Patient presents for restaging. EXAM: CT CHEST WITH CONTRAST TECHNIQUE: Multidetector CT imaging of the chest was performed during intravenous contrast administration. CONTRAST:  38m ISOVUE-300 IOPAMIDOL (ISOVUE-300) INJECTION 61% COMPARISON:  04/29/2016 chest CT. FINDINGS: Cardiovascular: Normal heart size. No significant pericardial fluid/thickening. Left main, left anterior and right coronary atherosclerosis. Atherosclerotic nonaneurysmal thoracic aorta. Stable top-normal caliber pulmonary arteries. No central pulmonary emboli. Mediastinum/Nodes: No discrete thyroid nodules. Unremarkable esophagus. No pathologically enlarged axillary, mediastinal or hilar lymph nodes. Lungs/Pleura: No pneumothorax. No pleural effusion. Moderate to severe centrilobular and paraseptal emphysema with diffuse bronchial wall thickening. Complete right middle lobe atelectasis, unchanged, which obscures the treated right middle lobe pulmonary nodule. New focal patchy nodular consolidation, ground-glass attenuation and reticulation in the subpleural medial basilar right upper lobe adjacent to the collapsed right middle lobe (series 5/image 124). Increased thickening of a bandlike opacity in the medial basilar right lower lobe (series 6/ image 91), favor postinfectious scarring or atelectasis. Additional parenchymal bands in the lingula and bilateral lower lobes are stable and compatible with postinfectious scarring. Left upper lobe 2 mm solid pulmonary nodule (series 5/ image 67), previously 3 mm, not appreciably changed. No new significant pulmonary nodules. Upper abdomen: Unremarkable. Musculoskeletal: No aggressive appearing focal osseous lesions. Moderate thoracic spondylosis. IMPRESSION: 1. Stable complete right middle lobe  atelectasis, which obscures the treated right middle lobe pulmonary nodule. 2. New focal patchy nodular consolidation, ground-glass attenuation and reticulation in the subpleural medial basilar right upper lobe adjacent to the collapsed right middle lobe, favor new postradiation change. This finding warrants attention on follow-up surveillance chest CT. 3. No evidence of metastatic disease in the chest. 4. Previously visualized hypermetabolic left upper lobe pulmonary nodule on the 10/23/2015 PET-CT study remains tiny (2 mm) and stable in size. 5. Aortic atherosclerosis. Left main and two-vessel coronary atherosclerosis. 6. Moderate to severe emphysema with diffuse bronchial wall thickening, suggesting COPD. Electronically Signed   By: JIlona SorrelM.D.   On: 07/03/2016 13:40    ASSESSMENT AND PLAN:  This is a very pleasant 66years old African-American female with a stage IIIa non-small cell lung cancer status post concurrent chemoradiation followed by consolidation chemotherapy with carboplatin and paclitaxel for 3 cycles. The patient tolerated the last course of her treatment well with no significant adverse effects. She had repeat CT scan of the chest performed recently. I personally and independently reviewed the scans and discussed the results with the patient today. Her scan showed no evidence for disease progression. I recommended for her to continue on observation with close monitoring and repeat CT scan of the chest in 3 months. For the pain management, she  will continue on Percocet and I gave her referral of her medication today. She will receive any further prescription from her primary care physician as the patient will be on observation. For the patient and lack of appetite, she will continue her treatment with Remeron. The patient was advised to call immediately if she has any concerning symptoms in the interval. The patient voices understanding of current disease status and treatment  options and is in agreement with the current care plan. All questions were answered. The patient knows to call the clinic with any problems, questions or concerns. We can certainly see the patient much sooner if necessary.  Disclaimer: This note was dictated with voice recognition software. Similar sounding words can inadvertently be transcribed and may not be corrected upon review.

## 2016-07-16 ENCOUNTER — Ambulatory Visit: Payer: Medicare HMO

## 2016-07-16 ENCOUNTER — Ambulatory Visit: Payer: Medicare HMO | Admitting: Internal Medicine

## 2016-07-16 ENCOUNTER — Other Ambulatory Visit: Payer: Medicare HMO

## 2016-07-18 ENCOUNTER — Other Ambulatory Visit: Payer: Self-pay | Admitting: Nurse Practitioner

## 2016-07-18 ENCOUNTER — Other Ambulatory Visit (HOSPITAL_COMMUNITY): Payer: Self-pay | Admitting: Nurse Practitioner

## 2016-07-18 DIAGNOSIS — R5381 Other malaise: Secondary | ICD-10-CM

## 2016-07-18 DIAGNOSIS — R748 Abnormal levels of other serum enzymes: Secondary | ICD-10-CM

## 2016-09-01 ENCOUNTER — Other Ambulatory Visit: Payer: Self-pay | Admitting: Internal Medicine

## 2016-10-08 ENCOUNTER — Other Ambulatory Visit (HOSPITAL_BASED_OUTPATIENT_CLINIC_OR_DEPARTMENT_OTHER): Payer: Medicare HMO

## 2016-10-08 DIAGNOSIS — C342 Malignant neoplasm of middle lobe, bronchus or lung: Secondary | ICD-10-CM

## 2016-10-08 DIAGNOSIS — M898X9 Other specified disorders of bone, unspecified site: Secondary | ICD-10-CM

## 2016-10-08 DIAGNOSIS — C3491 Malignant neoplasm of unspecified part of right bronchus or lung: Secondary | ICD-10-CM

## 2016-10-08 LAB — COMPREHENSIVE METABOLIC PANEL
ALT: 9 U/L (ref 0–55)
AST: 18 U/L (ref 5–34)
Albumin: 3.7 g/dL (ref 3.5–5.0)
Alkaline Phosphatase: 217 U/L — ABNORMAL HIGH (ref 40–150)
Anion Gap: 12 mEq/L — ABNORMAL HIGH (ref 3–11)
BILIRUBIN TOTAL: 0.37 mg/dL (ref 0.20–1.20)
BUN: 17.3 mg/dL (ref 7.0–26.0)
CO2: 27 meq/L (ref 22–29)
CREATININE: 0.7 mg/dL (ref 0.6–1.1)
Calcium: 10.1 mg/dL (ref 8.4–10.4)
Chloride: 106 mEq/L (ref 98–109)
EGFR: >90 mL/min/{1.73_m2} (ref >90)
GLUCOSE: 75 mg/dL (ref 70–140)
Potassium: 4 mEq/L (ref 3.5–5.1)
SODIUM: 145 meq/L (ref 136–145)
Total Protein: 7.1 g/dL (ref 6.4–8.3)

## 2016-10-08 LAB — CBC WITH DIFFERENTIAL/PLATELET
BASO%: 0.9 % (ref 0.0–2.0)
Basophils Absolute: 0 10*3/uL (ref 0.0–0.1)
EOS%: 1.4 % (ref 0.0–7.0)
Eosinophils Absolute: 0.1 10*3/uL (ref 0.0–0.5)
HCT: 43.2 % (ref 34.8–46.6)
HEMOGLOBIN: 14.9 g/dL (ref 11.6–15.9)
LYMPH%: 26.6 % (ref 14.0–49.7)
MCH: 29.7 pg (ref 25.1–34.0)
MCHC: 34.6 g/dL (ref 31.5–36.0)
MCV: 86.1 fL (ref 79.5–101.0)
MONO#: 0.5 10*3/uL (ref 0.1–0.9)
MONO%: 10.4 % (ref 0.0–14.0)
NEUT#: 2.7 10*3/uL (ref 1.5–6.5)
NEUT%: 60.7 % (ref 38.4–76.8)
Platelets: 239 10*3/uL (ref 145–400)
RBC: 5.02 10*6/uL (ref 3.70–5.45)
RDW: 15.1 % — AB (ref 11.2–14.5)
WBC: 4.5 10*3/uL (ref 3.9–10.3)
lymph#: 1.2 10*3/uL (ref 0.9–3.3)

## 2016-10-13 ENCOUNTER — Ambulatory Visit (HOSPITAL_COMMUNITY)
Admission: RE | Admit: 2016-10-13 | Discharge: 2016-10-13 | Disposition: A | Discharge status: Home / Self Care | Payer: Medicare HMO | Source: Ambulatory Visit | Attending: Internal Medicine | Admitting: Internal Medicine

## 2016-10-13 ENCOUNTER — Encounter (HOSPITAL_COMMUNITY): Payer: Self-pay

## 2016-10-13 DIAGNOSIS — J439 Emphysema, unspecified: Secondary | ICD-10-CM | POA: Diagnosis not present

## 2016-10-13 DIAGNOSIS — C3491 Malignant neoplasm of unspecified part of right bronchus or lung: Secondary | ICD-10-CM | POA: Diagnosis present

## 2016-10-13 DIAGNOSIS — J9811 Atelectasis: Secondary | ICD-10-CM | POA: Insufficient documentation

## 2016-10-13 DIAGNOSIS — M898X9 Other specified disorders of bone, unspecified site: Secondary | ICD-10-CM | POA: Diagnosis present

## 2016-10-13 MED ORDER — IOPAMIDOL (ISOVUE-300) INJECTION 61%
INTRAVENOUS | Status: AC
  Filled 2016-10-13: qty 75

## 2016-10-13 MED ORDER — IOPAMIDOL (ISOVUE-300) INJECTION 61%
75.0000 mL | Freq: Once | INTRAVENOUS | Status: AC | PRN
Start: 2016-10-13 — End: 2016-10-13
  Administered 2016-10-13: 75 mL via INTRAVENOUS

## 2016-10-16 ENCOUNTER — Telehealth: Payer: Self-pay | Admitting: Internal Medicine

## 2016-10-16 ENCOUNTER — Ambulatory Visit (HOSPITAL_BASED_OUTPATIENT_CLINIC_OR_DEPARTMENT_OTHER): Payer: Medicare HMO | Admitting: Internal Medicine

## 2016-10-16 ENCOUNTER — Encounter: Payer: Self-pay | Admitting: Internal Medicine

## 2016-10-16 VITALS — BP 108/82 | HR 99 | Temp 97.8°F | Resp 19 | Ht 67.0 in | Wt 132.2 lb

## 2016-10-16 DIAGNOSIS — M545 Low back pain: Secondary | ICD-10-CM | POA: Diagnosis not present

## 2016-10-16 DIAGNOSIS — C3491 Malignant neoplasm of unspecified part of right bronchus or lung: Secondary | ICD-10-CM

## 2016-10-16 DIAGNOSIS — C342 Malignant neoplasm of middle lobe, bronchus or lung: Secondary | ICD-10-CM | POA: Diagnosis not present

## 2016-10-16 MED ORDER — MIRTAZAPINE 15 MG PO TABS: 15.0000 mg | ORAL_TABLET | Freq: Every day | ORAL | 0 refills | Status: DC

## 2016-10-16 NOTE — Telephone Encounter (Signed)
Scheduled appt per 7/5 los - Gave patient AVS and calender per los. [facility] to contact patient with ct schedule.

## 2016-10-16 NOTE — Progress Notes (Signed)
[place] Telephone:(336) [phone]   Fax:(336) [phone]  OFFICE PROGRESS NOTE  Barry Dienes, NP [place] [address]  DIAGNOSIS: Stage IIIA (T1b, N2, M0) non-small cell lung cancer presented with right middle lobe pulmonary nodule, mediastinal lymphadenopathy and highly suspicious for small nodule in the left upper lobe that could change her stage to stage IV that could present another synchronous primary lesion in the left upper lobe. This was diagnosed in September 2017.  PRIOR THERAPY:  1) Concurrent chemoradiation with weekly carboplatin for AUC of 2 and paclitaxel 45 MG/M2 status post 6 cycles last dose was given 02/25/2016 with partial response. 2) Consolidation chemotherapy with carboplatin for AUC of 5 and paclitaxel 175 MG/M2 every 3 weeks with Neulasta support. First dose 04/22/2016. Status post 3 cycles.  CURRENT THERAPY: Observation.  INTERVAL HISTORY: Courtney Zuniga 66 y.o. female returns to the clinic today for follow-up visit. The patient is feeling fine today with no specific complaints except for intermittent low back pain with radiation to the right leg likely secondary to arthritis. The patient denied having any chest pain, shortness of breath, cough or hemoptysis. She denied having any fever or chills. She has no nausea, vomiting, diarrhea or constipation. She had repeat CT scan of the chest performed recently and she is here for evaluation and discussion of her scan results.    MEDICAL HISTORY: Past Medical History:  Diagnosis Date  . Anemia    as a young woman  . Arthritis    osteoartritis  . Asthma   . Brain tumor (benign) ([place]) 2005 [facility]   Benign  . Chronic headaches   . Chronic hip pain   . Chronic pain   . COPD (chronic obstructive pulmonary disease) ([place])   . Coronary artery disease   . Depression   . Depression 05/15/2016  . Encounter for antineoplastic chemotherapy 01/10/2016  . Hypertension   . Lung cancer ([place])  dx'd 01/2016  . NSTEMI (non-ST elevated myocardial infarction) ([place])   . On home O2    qhs  . Pneumonia   . Shortness of breath dyspnea     ALLERGIES:  is allergic to no known allergies.  MEDICATIONS:  Current Outpatient Prescriptions  Medication Sig Dispense Refill  . albuterol (PROVENTIL HFA;VENTOLIN HFA) 108 (90 BASE) MCG/ACT inhaler Inhale 2 puffs into the lungs every 6 (six) hours as needed for shortness of breath.     Marland Kitchen albuterol (PROVENTIL) (2.5 MG/3ML) 0.083% nebulizer solution Take 2.5 mg by nebulization every 6 (six) hours as needed for wheezing or shortness of breath.    . Aspirin-Salicylamide-Caffeine (BC HEADACHE POWDER PO) Take 1 packet by mouth daily as needed (headache).    . calcium-vitamin D 250-100 MG-UNIT per tablet Take 1 tablet by mouth daily.    . Cholecalciferol (VITAMIN D3) 5000 units TABS Take 1 tablet by mouth daily.    . cyclobenzaprine (FLEXERIL) 10 MG tablet     . Fluticasone-Salmeterol (ADVAIR) 500-50 MCG/DOSE AEPB Inhale 1 puff into the lungs every 12 (twelve) hours.    . lidocaine (XYLOCAINE) 2 % solution Use as directed 10 mLs in the mouth or throat as needed for mouth pain (swallow 10 ml 30 minutes before a meal as needed.. Disp 250 cc. 0 refills.).     Marland Kitchen mirtazapine (REMERON) 15 MG tablet Take 1 tablet (15 mg total) by mouth at bedtime. 30 tablet 1  . mirtazapine (REMERON) 15 MG tablet TAKE ONE TABLET BY MOUTH AT BEDTIME.  30 tablet 0  . nitroGLYCERIN (NITROSTAT) 0.4 MG SL tablet Place 0.4 mg under the tongue every 5 (five) minutes as needed for chest pain.    Marland Kitchen oxyCODONE-acetaminophen (PERCOCET/ROXICET) 5-325 MG tablet Take 1 tablet by mouth every 6 (six) hours as needed for severe pain. 30 tablet 0  . pantoprazole (PROTONIX) 40 MG tablet TAKE 1 TABLET BY MOUTH ONCE DAILY. 30 tablet 11  . prochlorperazine (COMPAZINE) 10 MG tablet Take 1 tablet (10 mg total) by mouth every 6 (six) hours as needed for nausea or vomiting. (Patient not taking: Reported on  05/15/2016) 30 tablet 0  . QVAR 80 MCG/ACT inhaler Inhale 1 puff into the lungs 2 (two) times daily as needed.     . sucralfate (CARAFATE) 1 g tablet Take 1 tablet (1 g total) by mouth 4 (four) times daily. 12 tablet 0  . Tiotropium Bromide-Olodaterol (STIOLTO RESPIMAT) 2.5-2.5 MCG/ACT AERS Inhale 1 puff into the lungs daily.     No current facility-administered medications for this visit.     SURGICAL HISTORY:  Past Surgical History:  Procedure Laterality Date  . CHOLECYSTECTOMY    . COLONOSCOPY  2015   Results requested from [facility] [place]  . COLONOSCOPY    . ESOPHAGOGASTRODUODENOSCOPY N/A 08/14/2015   Procedure: ESOPHAGOGASTRODUODENOSCOPY (EGD);  Surgeon: Daneil Dolin, MD;  [facility];  Service: Endoscopy;  Laterality: N/A;  215   . ESOPHAGOGASTRODUODENOSCOPY (EGD) WITH PROPOFOL N/A 09/13/2015   Procedure: ESOPHAGOGASTRODUODENOSCOPY (EGD) WITH PROPOFOL;  Surgeon: Milus Banister, MD;  [facility];  Service: Endoscopy;  Laterality: N/A;  . TUMOR REMOVAL  2005   Benign  . UPPER ESOPHAGEAL ENDOSCOPIC ULTRASOUND (EUS)  09/13/2015   Procedure: UPPER ESOPHAGEAL ENDOSCOPIC ULTRASOUND (EUS);  Surgeon: Milus Banister, MD;  Location: [facility];  Service: Endoscopy;;  . VIDEO BRONCHOSCOPY WITH ENDOBRONCHIAL NAVIGATION N/A 12/31/2015   Procedure: VIDEO BRONCHOSCOPY WITH ENDOBRONCHIAL NAVIGATION;  Surgeon: Melrose Nakayama, MD;  Location: [facility];  Service: Thoracic;  Laterality: N/A;  . VIDEO BRONCHOSCOPY WITH ENDOBRONCHIAL ULTRASOUND N/A 11/08/2015   Procedure: VIDEO BRONCHOSCOPY WITH ENDOBRONCHIAL ULTRASOUND;  Surgeon: Ivin Poot, MD;  [facility];  Service: Thoracic;  Laterality: N/A;    REVIEW OF SYSTEMS:  A comprehensive review of systems was negative except for: Musculoskeletal: positive for back pain   PHYSICAL EXAMINATION: General appearance: alert, cooperative and no distress Head: Normocephalic, without obvious abnormality, atraumatic Neck: no  adenopathy, no JVD, supple, symmetrical, trachea midline and thyroid not enlarged, symmetric, no tenderness/mass/nodules Lymph nodes: Cervical, supraclavicular, and axillary nodes normal. Resp: clear to auscultation bilaterally Back: symmetric, no curvature. ROM normal. No CVA tenderness. Cardio: regular rate and rhythm, S1, S2 normal, no murmur, click, rub or gallop GI: soft, non-tender; bowel sounds normal; no masses,  no organomegaly Extremities: extremities normal, atraumatic, no cyanosis or edema  ECOG PERFORMANCE STATUS: 1 - Symptomatic but completely ambulatory  Blood pressure 108/82, pulse 99, temperature 97.8 F (36.6 C), temperature source Oral, resp. rate 19, height 5\' 7"  (1.702 m), weight 132 lb 3.2 oz (60 kg), SpO2 94 %.  LABORATORY DATA: Lab Results  Component Value Date   WBC 4.5 10/08/2016   HGB 14.9 10/08/2016   HCT 43.2 10/08/2016   MCV 86.1 10/08/2016   PLT 239 10/08/2016      Chemistry      Component Value Date/Time   NA 145 10/08/2016 0859   K 4.0 10/08/2016 0859   CL 102 04/29/2016 1032   CO2 27 10/08/2016  0859   BUN 17.3 10/08/2016 0859   CREATININE 0.7 10/08/2016 0859      Component Value Date/Time   CALCIUM 10.1 10/08/2016 0859   ALKPHOS 217 (H) 10/08/2016 0859   AST 18 10/08/2016 0859   ALT 9 10/08/2016 0859   BILITOT 0.37 10/08/2016 0859       RADIOGRAPHIC STUDIES: Ct Chest W Contrast  Result Date: 10/13/2016 CLINICAL DATA:  Non-small cell lung cancer. Subsequent treatment evaluation. Patient status post consolidative chemotherapy and radiation therapy. EXAM: CT CHEST WITH CONTRAST TECHNIQUE: Multidetector CT imaging of the chest was performed during intravenous contrast administration. CONTRAST:  33mL ISOVUE-300 IOPAMIDOL (ISOVUE-300) INJECTION 61% COMPARISON:  CT 07/03/2016 FINDINGS: Cardiovascular: No significant vascular findings. Normal heart size. No pericardial effusion. Mediastinum/Nodes: No axillary or supraclavicular lymphadenopathy.  No mediastinal lymphadenopathy. No pericardial fluid. Lungs/Pleura: Extensive centrilobular emphysema in the upper lobes. Atelectasis in the medial RIGHT middle lobe is similar prior. Interval resolution of the nodular reticular pattern peripheral to the RIGHT middle lobe atelectasis as described on comparison exam. No new pulmonary nodules are present. Tiny subpleural nodule in the RIGHT lower lobe measuring 3 mm (image 83, series 5 is unchanged. Upper Abdomen: Limited view of the liver, kidneys, pancreas are unremarkable. Normal adrenal glands. Assess skeletal: No aggressive osseous lesion. Probable hemangioma in the lower thoracic spine no IMPRESSION: 1. No evidence of lung cancer recurrence metastasis. 2.  Emphysema (ICD10-J43.9). 3. Stable RIGHT middle lobe atelectasis. Electronically Signed   By: Suzy Bouchard M.D.   On: 10/13/2016 12:57    ASSESSMENT AND PLAN:  This is a very pleasant 66 years old African-American female with a stage IIIa non-small cell lung cancer status post concurrent chemoradiation followed by consolidation chemotherapy with carboplatin and paclitaxel for 3 cycles and currently on observation. The patient is doing fine and no concerning complaints related to her cancer. She had repeat CT scan of the chest performed recently. Her scan showed no evidence for disease recurrence. I recommended for the patient to continue on observation with repeat CT scan of the chest in 3 months. I will see her back for follow-up visit in 3 months for reevaluation and repeat blood work and CT scan of the chest. She was advised to call immediately if she has any concerning symptoms in the interval. The patient voices understanding of current disease status and treatment options and is in agreement with the current care plan. All questions were answered. The patient knows to call the clinic with any problems, questions or concerns. We can certainly see the patient much sooner if  necessary.  Disclaimer: This note was dictated with voice recognition software. Similar sounding words can inadvertently be transcribed and may not be corrected upon review.

## 2016-11-17 ENCOUNTER — Other Ambulatory Visit: Payer: Self-pay | Admitting: Internal Medicine

## 2017-01-12 ENCOUNTER — Other Ambulatory Visit (HOSPITAL_BASED_OUTPATIENT_CLINIC_OR_DEPARTMENT_OTHER): Payer: Medicare HMO

## 2017-01-12 DIAGNOSIS — C342 Malignant neoplasm of middle lobe, bronchus or lung: Secondary | ICD-10-CM | POA: Diagnosis not present

## 2017-01-12 DIAGNOSIS — C3491 Malignant neoplasm of unspecified part of right bronchus or lung: Secondary | ICD-10-CM

## 2017-01-12 LAB — CBC WITH DIFFERENTIAL/PLATELET
BASO%: 0.8 % (ref 0.0–2.0)
Basophils Absolute: 0.1 10*3/uL (ref 0.0–0.1)
EOS ABS: 0.1 10*3/uL (ref 0.0–0.5)
EOS%: 2 % (ref 0.0–7.0)
HEMATOCRIT: 44 % (ref 34.8–46.6)
HEMOGLOBIN: 15.2 g/dL (ref 11.6–15.9)
LYMPH#: 1.5 10*3/uL (ref 0.9–3.3)
LYMPH%: 22.9 % (ref 14.0–49.7)
MCH: 29.6 pg (ref 25.1–34.0)
MCHC: 34.5 g/dL (ref 31.5–36.0)
MCV: 86 fL (ref 79.5–101.0)
MONO#: 0.6 10*3/uL (ref 0.1–0.9)
MONO%: 8.5 % (ref 0.0–14.0)
NEUT%: 65.8 % (ref 38.4–76.8)
NEUTROS ABS: 4.3 10*3/uL (ref 1.5–6.5)
Platelets: 274 10*3/uL (ref 145–400)
RBC: 5.12 10*6/uL (ref 3.70–5.45)
RDW: 14.7 % — ABNORMAL HIGH (ref 11.2–14.5)
WBC: 6.5 10*3/uL (ref 3.9–10.3)

## 2017-01-12 LAB — COMPREHENSIVE METABOLIC PANEL
ALBUMIN: 3.9 g/dL (ref 3.5–5.0)
ALT: 10 U/L (ref 0–55)
ANION GAP: 9 meq/L (ref 3–11)
AST: 18 U/L (ref 5–34)
Alkaline Phosphatase: 232 U/L — ABNORMAL HIGH (ref 40–150)
BILIRUBIN TOTAL: 0.33 mg/dL (ref 0.20–1.20)
BUN: 13.4 mg/dL (ref 7.0–26.0)
CALCIUM: 10.2 mg/dL (ref 8.4–10.4)
CO2: 26 meq/L (ref 22–29)
CREATININE: 0.7 mg/dL (ref 0.6–1.1)
Chloride: 107 mEq/L (ref 98–109)
Glucose: 94 mg/dl (ref 70–140)
Potassium: 4.2 mEq/L (ref 3.5–5.1)
Sodium: 143 mEq/L (ref 136–145)
TOTAL PROTEIN: 7.5 g/dL (ref 6.4–8.3)

## 2017-01-19 ENCOUNTER — Telehealth: Payer: Self-pay | Admitting: Internal Medicine

## 2017-01-19 ENCOUNTER — Encounter: Payer: Self-pay | Admitting: Internal Medicine

## 2017-01-19 ENCOUNTER — Ambulatory Visit (HOSPITAL_BASED_OUTPATIENT_CLINIC_OR_DEPARTMENT_OTHER): Payer: Medicare HMO | Admitting: Internal Medicine

## 2017-01-19 VITALS — BP 111/74 | HR 101 | Temp 97.9°F | Resp 20 | Ht 67.0 in | Wt 133.2 lb

## 2017-01-19 DIAGNOSIS — C3491 Malignant neoplasm of unspecified part of right bronchus or lung: Secondary | ICD-10-CM

## 2017-01-19 DIAGNOSIS — C342 Malignant neoplasm of middle lobe, bronchus or lung: Secondary | ICD-10-CM | POA: Diagnosis not present

## 2017-01-19 NOTE — Telephone Encounter (Signed)
Scheduled appt per 10/8 los - Gave patient AVS and calender per los.  

## 2017-01-19 NOTE — Progress Notes (Signed)
[place] [phone]   [phone]  OFFICE PROGRESS NOTE  Barry Dienes, NP [place] [address]  DIAGNOSIS: Stage IIIA (T1b, N2, M0) non-small cell lung cancer presented with right middle lobe pulmonary nodule, mediastinal lymphadenopathy and highly suspicious for small nodule in the left upper lobe that could change her stage to stage IV that could present another synchronous primary lesion in the left upper lobe. This was diagnosed in September 2017.  PRIOR THERAPY:  1) Concurrent chemoradiation with weekly carboplatin for AUC of 2 and paclitaxel 45 MG/M2 status post 6 cycles last dose was given 02/25/2016 with partial response. 2) Consolidation chemotherapy with carboplatin for AUC of 5 and paclitaxel 175 MG/M2 every 3 weeks with Neulasta support. First dose 04/22/2016. Status post 3 cycles.  CURRENT THERAPY: Observation.  INTERVAL HISTORY: Courtney Zuniga 66 y.o. female returns to the clinic today for three-month follow-up visit. The patient is feeling fine today was no specific complaints. She denied having any chest pain, shortness of breath, cough or hemoptysis. She denied having any fever or chills. She has no nausea, vomiting, diarrhea or constipation. She has no significant weight loss or night sweats.she was supposed to have repeat CT scan of the chest performed before this visit but unfortunately he was not called for the appointment.  MEDICAL HISTORY: Past Medical History:  Diagnosis Date  . Anemia    as a young woman  . Arthritis    osteoartritis  . Asthma   . Brain tumor (benign) ([place]) 2005 [facility]   Benign  . Chronic headaches   . Chronic hip pain   . Chronic pain   . COPD (chronic obstructive pulmonary disease) ([place])   . Coronary artery disease   . Depression   . Depression 05/15/2016  . Encounter for antineoplastic chemotherapy 01/10/2016  . Hypertension   . Lung cancer ([place]) dx'd 01/2016  . NSTEMI (non-ST  elevated myocardial infarction) ([place])   . On home O2    qhs  . Pneumonia   . Shortness of breath dyspnea     ALLERGIES:  is allergic to no known allergies.  MEDICATIONS:  Current Outpatient Prescriptions  Medication Sig Dispense Refill  . albuterol (PROVENTIL HFA;VENTOLIN HFA) 108 (90 BASE) MCG/ACT inhaler Inhale 2 puffs into the lungs every 6 (six) hours as needed for shortness of breath.     Marland Kitchen albuterol (PROVENTIL) (2.5 MG/3ML) 0.083% nebulizer solution Take 2.5 mg by nebulization every 6 (six) hours as needed for wheezing or shortness of breath.    . Aspirin-Salicylamide-Caffeine (BC HEADACHE POWDER PO) Take 1 packet by mouth daily as needed (headache).    . calcium-vitamin D 250-100 MG-UNIT per tablet Take 1 tablet by mouth daily.    . Cholecalciferol (VITAMIN D3) 5000 units TABS Take 1 tablet by mouth daily.    . cyclobenzaprine (FLEXERIL) 10 MG tablet     . Fluticasone-Salmeterol (ADVAIR) 500-50 MCG/DOSE AEPB Inhale 1 puff into the lungs every 12 (twelve) hours.    . lidocaine (XYLOCAINE) 2 % solution Use as directed 10 mLs in the mouth or throat as needed for mouth pain (swallow 10 ml 30 minutes before a meal as needed.. Disp 250 cc. 0 refills.).     Marland Kitchen mirtazapine (REMERON) 15 MG tablet Take 1 tablet (15 mg total) by mouth at bedtime. 30 tablet 1  . mirtazapine (REMERON) 15 MG tablet Take 1 tablet (15 mg total) by mouth at bedtime. 30 tablet 0  .  nitroGLYCERIN (NITROSTAT) 0.4 MG SL tablet Place 0.4 mg under the tongue every 5 (five) minutes as needed for chest pain.    Marland Kitchen oxyCODONE-acetaminophen (PERCOCET/ROXICET) 5-325 MG tablet Take 1 tablet by mouth every 6 (six) hours as needed for severe pain. 30 tablet 0  . pantoprazole (PROTONIX) 40 MG tablet TAKE 1 TABLET BY MOUTH ONCE DAILY. 30 tablet 11  . prochlorperazine (COMPAZINE) 10 MG tablet TAKE 1 TABLET BY MOUTH EVERY 6 HOURS AS NEEDED FOR NAUSEA & VOMITING 30 tablet 0  . QVAR 80 MCG/ACT inhaler Inhale 1 puff into the lungs 2 (two)  times daily as needed.     . sucralfate (CARAFATE) 1 g tablet Take 1 tablet (1 g total) by mouth 4 (four) times daily. 12 tablet 0  . Tiotropium Bromide-Olodaterol (STIOLTO RESPIMAT) 2.5-2.5 MCG/ACT AERS Inhale 1 puff into the lungs daily.     No current facility-administered medications for this visit.     SURGICAL HISTORY:  Past Surgical History:  Procedure Laterality Date  . CHOLECYSTECTOMY    . COLONOSCOPY  2015   Results requested from [facility]  . COLONOSCOPY    . ESOPHAGOGASTRODUODENOSCOPY N/A 08/14/2015   Procedure: ESOPHAGOGASTRODUODENOSCOPY (EGD);  Surgeon: Daneil Dolin, MD;  [facility];  Service: Endoscopy;  Laterality: N/A;  215   . ESOPHAGOGASTRODUODENOSCOPY (EGD) WITH PROPOFOL N/A 09/13/2015   Procedure: ESOPHAGOGASTRODUODENOSCOPY (EGD) WITH PROPOFOL;  Surgeon: Milus Banister, MD;  [facility];  Service: Endoscopy;  Laterality: N/A;  . TUMOR REMOVAL  2005   Benign  . UPPER ESOPHAGEAL ENDOSCOPIC ULTRASOUND (EUS)  09/13/2015   Procedure: UPPER ESOPHAGEAL ENDOSCOPIC ULTRASOUND (EUS);  Surgeon: Milus Banister, MD;  Location: [facility];  Service: Endoscopy;;  . VIDEO BRONCHOSCOPY WITH ENDOBRONCHIAL NAVIGATION N/A 12/31/2015   Procedure: VIDEO BRONCHOSCOPY WITH ENDOBRONCHIAL NAVIGATION;  Surgeon: Melrose Nakayama, MD;  Location: [facility];  Service: Thoracic;  Laterality: N/A;  . VIDEO BRONCHOSCOPY WITH ENDOBRONCHIAL ULTRASOUND N/A 11/08/2015   Procedure: VIDEO BRONCHOSCOPY WITH ENDOBRONCHIAL ULTRASOUND;  Surgeon: Ivin Poot, MD;  Location: [facility];  Service: Thoracic;  Laterality: N/A;    REVIEW OF SYSTEMS:  A comprehensive review of systems was negative.   PHYSICAL EXAMINATION: General appearance: alert, cooperative and no distress Head: Normocephalic, without obvious abnormality, atraumatic Neck: no adenopathy, no JVD, supple, symmetrical, trachea midline and thyroid not enlarged, symmetric, no tenderness/mass/nodules Lymph nodes:  Cervical, supraclavicular, and axillary nodes normal. Resp: clear to auscultation bilaterally Back: symmetric, no curvature. ROM normal. No CVA tenderness. Cardio: regular rate and rhythm, S1, S2 normal, no murmur, click, rub or gallop GI: soft, non-tender; bowel sounds normal; no masses,  no organomegaly Extremities: extremities normal, atraumatic, no cyanosis or edema  ECOG PERFORMANCE STATUS: 1 - Symptomatic but completely ambulatory  Blood pressure 111/74, pulse (!) 101, temperature 97.9 F (36.6 C), temperature source Oral, resp. rate 20, height 5\' 7"  (1.702 m), weight 133 lb 3.2 oz (60.4 kg).  LABORATORY DATA: Lab Results  Component Value Date   WBC 6.5 01/12/2017   HGB 15.2 01/12/2017   HCT 44.0 01/12/2017   MCV 86.0 01/12/2017   PLT 274 01/12/2017      Chemistry      Component Value Date/Time   NA 143 01/12/2017 0751   K 4.2 01/12/2017 0751   CL 102 04/29/2016 1032   CO2 26 01/12/2017 0751   BUN 13.4 01/12/2017 0751   CREATININE 0.7 01/12/2017 0751      Component Value Date/Time   CALCIUM  10.2 01/12/2017 0751   ALKPHOS 232 (H) 01/12/2017 0751   AST 18 01/12/2017 0751   ALT 10 01/12/2017 0751   BILITOT 0.33 01/12/2017 0751       RADIOGRAPHIC STUDIES: No results found.  ASSESSMENT AND PLAN:  This is a very pleasant 66 years old African-American female with a stage IIIa non-small cell lung cancer status post concurrent chemoradiation followed by consolidation chemotherapy with carboplatin and paclitaxel for 3 cycles and currently on observation. The patient is doing fine today. She was supposed to have repeat CT scan of the chest before this visit but this was not scheduled as ordered. I will arrange for the patient to have her scan done in the next few days and if no evidence for disease recurrence, I would see her back for follow-up visit in 3 months for reevaluation with repeat CT scan of the chest. The patient was advised to call immediately if she has any  concerning symptoms in the interval. The patient voices understanding of current disease status and treatment options and is in agreement with the current care plan. All questions were answered. The patient knows to call the clinic with any problems, questions or concerns. We can certainly see the patient much sooner if necessary.  Disclaimer: This note was dictated with voice recognition software. Similar sounding words can inadvertently be transcribed and may not be corrected upon review.

## 2017-01-22 ENCOUNTER — Encounter: Payer: Self-pay | Admitting: Medical Oncology

## 2017-01-22 ENCOUNTER — Ambulatory Visit (HOSPITAL_COMMUNITY)
Admission: RE | Admit: 2017-01-22 | Discharge: 2017-01-22 | Disposition: A | Discharge status: Home / Self Care | Payer: Medicare HMO | Source: Ambulatory Visit | Attending: Internal Medicine | Admitting: Internal Medicine

## 2017-01-22 ENCOUNTER — Encounter (HOSPITAL_COMMUNITY): Payer: Self-pay

## 2017-01-22 DIAGNOSIS — I2510 Atherosclerotic heart disease of native coronary artery without angina pectoris: Secondary | ICD-10-CM | POA: Insufficient documentation

## 2017-01-22 DIAGNOSIS — J439 Emphysema, unspecified: Secondary | ICD-10-CM | POA: Diagnosis not present

## 2017-01-22 DIAGNOSIS — R918 Other nonspecific abnormal finding of lung field: Secondary | ICD-10-CM | POA: Insufficient documentation

## 2017-01-22 DIAGNOSIS — I288 Other diseases of pulmonary vessels: Secondary | ICD-10-CM | POA: Insufficient documentation

## 2017-01-22 DIAGNOSIS — C3491 Malignant neoplasm of unspecified part of right bronchus or lung: Secondary | ICD-10-CM | POA: Diagnosis not present

## 2017-01-22 DIAGNOSIS — I700 Atherosclerosis of aorta: Secondary | ICD-10-CM | POA: Insufficient documentation

## 2017-01-22 MED ORDER — IOPAMIDOL (ISOVUE-300) INJECTION 61%
75.0000 mL | Freq: Once | INTRAVENOUS | Status: AC | PRN
  Administered 2017-01-22: 75 mL via INTRAVENOUS

## 2017-01-22 MED ORDER — IOPAMIDOL (ISOVUE-300) INJECTION 61%
INTRAVENOUS | Status: AC
  Filled 2017-01-22: qty 75

## 2017-01-23 ENCOUNTER — Telehealth: Payer: Self-pay | Admitting: Internal Medicine

## 2017-01-23 NOTE — Telephone Encounter (Signed)
Scheduled appt per 10/11 sch message - unable to leave message - vm full. Sent patient reminder letter in the mail.

## 2017-01-29 ENCOUNTER — Telehealth: Payer: Self-pay | Admitting: Internal Medicine

## 2017-01-29 ENCOUNTER — Ambulatory Visit (HOSPITAL_BASED_OUTPATIENT_CLINIC_OR_DEPARTMENT_OTHER): Payer: Medicare HMO | Admitting: Oncology

## 2017-01-29 VITALS — BP 128/79 | HR 98 | Temp 98.4°F | Resp 20 | Ht 67.0 in | Wt 139.2 lb

## 2017-01-29 DIAGNOSIS — C3491 Malignant neoplasm of unspecified part of right bronchus or lung: Secondary | ICD-10-CM | POA: Diagnosis not present

## 2017-01-29 DIAGNOSIS — R911 Solitary pulmonary nodule: Secondary | ICD-10-CM | POA: Diagnosis not present

## 2017-01-29 NOTE — Telephone Encounter (Signed)
Gave avs and calendar for December  °

## 2017-01-30 ENCOUNTER — Encounter: Payer: Self-pay | Admitting: Oncology

## 2017-01-30 NOTE — Progress Notes (Signed)
[facility] Follow up:    Courtney Dienes, NP [place] [address]   DIAGNOSIS: Stage IIIA (T1b, N2, M0) non-small cell lung cancer presented with right middle lobe pulmonary nodule, mediastinal lymphadenopathy and highly suspicious for small nodule in the left upper lobe that could change her stage to stage IV that could present another synchronous primary lesion in the left upper lobe. This was diagnosed in September 2017.  SUMMARY OF ONCOLOGIC HISTORY: Oncology History   Patient presented with abnormal LDCT screening.  Adenocarcinoma of right lung, stage 3 (HCC)   Staging form: Lung, AJCC 7th Edition   - Clinical stage from 01/10/2016: Stage IIIA (T1b, N2, M0) - Signed by Curt Bears, MD on 01/10/2016      Adenocarcinoma of right lung, stage 3 ([place])   09/25/2015 Imaging    LDCT screening A 2.2 cm nodule is present within the right middle lobe and is suspicious.      10/23/2015 Imaging    PET The nodule of concern detected during recent lung cancer screening examination is hypermetabolic, highly concerning for primary bronchogenic neoplasm. This is associated with nonenlarged but hypermetabolic right hilar and subcarinal lymph nodes. In addition, there is a 6 mm left upper lobe nodule which demonstrates hypermetabolism, concerning for a potential metastatic lesion or synchronous primary neoplasm.      12/31/2015 Surgery    OPERATION: 1. Video bronchoscopy. 2. Endoscopic bronchial ultrasound guided biopsy of mediastinal lymph     nodes.       12/31/2015 Pathology Results    Lung, biopsy, Right middle lobe - NON-SMALL CELL CARCINOMA, SEE COMMENT      01/10/2016 Initial Diagnosis    Adenocarcinoma of right lung, stage 3 ([place])     01/15/2016 Surgery    PROCEDURE:  Electromagnetic navigational bronchoscopy with brushings, biopsies and needle aspirations.      01/15/2016 -  Radiation Therapy    SIM       01/22/2016 -  Chemotherapy     The patient had palonosetron (ALOXI) injection 0.25 mg, 0.25 mg, Intravenous,  Once, 1 of 7 cycles  CARBOplatin (PARAPLATIN) 160 mg in sodium chloride 0.9 % 100 mL chemo infusion, 160 mg (100 % of original dose 159.6 mg), Intravenous,  Once, 1 of 7 cycles Dose modification: 159.6 mg (original dose 159.6 mg, Cycle 1)  PACLitaxel (TAXOL) 78 mg in sodium chloride 0.9 % 250 mL chemo infusion ( for chemotherapy treatment.        PRIOR THERAPY:  1) Concurrent chemoradiation with weekly carboplatin for AUC of 2 and paclitaxel 45 MG/M2 status post 6 cycles last dose was given 02/25/2016 with partial response. 2) Consolidation chemotherapy with carboplatin for AUC of 5 and paclitaxel 175 MG/M2 every 3 weeks with Neulasta support. First dose 04/22/2016. Status post 3 cycles.  CURRENT THERAPY: observation  INTERVAL HISTORY: Courtney Zuniga 66 y.o. female returns for routine follow-up today. The patient is feeling fine today was no specific complaints. She denied having any chest pain, shortness of breath, cough or hemoptysis. She denied having any fever or chills. She has no nausea, vomiting, diarrhea or constipation. She has no significant weight loss or night sweats. The patient had a recent staging CT scan the chest and is here to discuss the results.   Patient Active Problem List   Diagnosis Date Noted  . Depression 05/15/2016  . Adenocarcinoma of right lung, stage 3 ([place]) 01/10/2016  . Encounter for antineoplastic chemotherapy 01/10/2016  . Lung mass  01/03/2016  . Arthritis   . Gastric nodule   . Mucosal abnormality of stomach   . Abdominal pain 08/08/2015  . Abnormal CT of the abdomen 08/08/2015  . Influenza with pneumonia 06/27/2014  . Hypoxia 06/26/2014  . COPD exacerbation ([place]) 06/26/2014  . CAP (community acquired pneumonia)   . Chest pain, rule out acute myocardial infarction   . Asthma, chronic   . Chest pain 02/16/2014  . HTN (hypertension) 02/16/2014  . DDD (degenerative  disc disease), lumbar 02/04/2011  . Lumbar herniated disc 01/09/2011    is allergic to no known allergies.  MEDICAL HISTORY: Past Medical History:  Diagnosis Date  . Anemia    as a young woman  . Arthritis    osteoartritis  . Asthma   . Brain tumor (benign) ([place]) 2005 [facility]   Benign  . Chronic headaches   . Chronic hip pain   . Chronic pain   . COPD (chronic obstructive pulmonary disease) ([place])   . Coronary artery disease   . Depression   . Depression 05/15/2016  . Encounter for antineoplastic chemotherapy 01/10/2016  . Hypertension   . Lung cancer ([place]) dx'd 01/2016  . NSTEMI (non-ST elevated myocardial infarction) ([place])   . On home O2    qhs  . Pneumonia   . Shortness of breath dyspnea     SURGICAL HISTORY: Past Surgical History:  Procedure Laterality Date  . CHOLECYSTECTOMY    . COLONOSCOPY  2015   [facility]  . COLONOSCOPY    . ESOPHAGOGASTRODUODENOSCOPY N/A 08/14/2015   Procedure: ESOPHAGOGASTRODUODENOSCOPY (EGD);  Surgeon: Daneil Dolin, MD;  [facility];  Service: Endoscopy;  Laterality: N/A;  215   . ESOPHAGOGASTRODUODENOSCOPY (EGD) WITH PROPOFOL N/A 09/13/2015   Procedure: ESOPHAGOGASTRODUODENOSCOPY (EGD) WITH PROPOFOL;  Surgeon: Milus Banister, MD;  [facility];  Service: Endoscopy;  Laterality: N/A;  . TUMOR REMOVAL  2005   Benign  . UPPER ESOPHAGEAL ENDOSCOPIC ULTRASOUND (EUS)  09/13/2015   Procedure: UPPER ESOPHAGEAL ENDOSCOPIC ULTRASOUND (EUS);  Surgeon: Milus Banister, MD;  Location: [facility];  Service: Endoscopy;;  . VIDEO BRONCHOSCOPY WITH ENDOBRONCHIAL NAVIGATION N/A 12/31/2015   Procedure: VIDEO BRONCHOSCOPY WITH ENDOBRONCHIAL NAVIGATION;  Surgeon: Melrose Nakayama, MD;  Location: [facility];  Service: Thoracic;  Laterality: N/A;  . VIDEO BRONCHOSCOPY WITH ENDOBRONCHIAL ULTRASOUND N/A 11/08/2015   Procedure: VIDEO BRONCHOSCOPY WITH ENDOBRONCHIAL ULTRASOUND;  Surgeon: Ivin Poot, MD;   Location: [facility];  Service: Thoracic;  Laterality: N/A;    SOCIAL HISTORY: Social History   Social History  . Marital status: Single    Spouse name: N/A  . Number of children: N/A  . Years of education: 10th grade   Occupational History  . retired    Social History Main Topics  . Smoking status: Former Smoker    Packs/day: 0.25    Types: Cigarettes    Quit date: 01/15/2016  . Smokeless tobacco: Never Used     Comment: Pt has asked doctor for med to help   . Alcohol use No  . Drug use: No  . Sexual activity: No   Other Topics Concern  . Not on file   Social History Narrative  . No narrative on file    FAMILY HISTORY: Family History  Problem Relation Age of Onset  . Ovarian cancer Mother   . Lung cancer Father   . Lung cancer Brother   . Lung cancer Brother   . Prostate cancer Brother   .  Brain cancer Sister        Half sister    Review of Systems  Constitutional: Negative.   HENT:  Negative.   Eyes: Negative.   Respiratory: Negative.   Cardiovascular: Negative.   Genitourinary: Negative.    Skin: Negative.   Neurological: Negative.   Hematological: Negative.   Psychiatric/Behavioral: Negative.     PHYSICAL EXAMINATION  ECOG PERFORMANCE STATUS: 1 - Symptomatic but completely ambulatory  Vitals:   01/29/17 1028  BP: 128/79  Pulse: 98  Resp: 20  Temp: 98.4 F (36.9 C)    Physical Exam  Constitutional: She is oriented to person, place, and time and well-developed, well-nourished, and in no distress. No distress.  HENT:  Head: Normocephalic and atraumatic.  Mouth/Throat: No oropharyngeal exudate.  Eyes: Conjunctivae are normal. Right eye exhibits no discharge. Left eye exhibits no discharge. No scleral icterus.  Neck: Normal range of motion. Neck supple.  Cardiovascular: Normal rate, regular rhythm, normal heart sounds and intact distal pulses.   Pulmonary/Chest: Effort normal. She has wheezes.  Expiratory wheezes noted.  Abdominal: Soft. Bowel  sounds are normal. She exhibits no distension and no mass. There is no tenderness.  Musculoskeletal: Normal range of motion. She exhibits no edema.  Lymphadenopathy:    She has no cervical adenopathy.  Neurological: She is alert and oriented to person, place, and time. She exhibits normal muscle tone. Gait normal. Coordination normal.  Skin: Skin is warm and dry. No rash noted. She is not diaphoretic. No erythema. No pallor.  Psychiatric: Mood, memory, affect and judgment normal.  Vitals reviewed.   LABORATORY DATA:  CBC    Component Value Date/Time   WBC 6.5 01/12/2017 0751   WBC 22.0 (H) 04/29/2016 1032   RBC 5.12 01/12/2017 0751   RBC 4.46 04/29/2016 1032   HGB 15.2 01/12/2017 0751   HCT 44.0 01/12/2017 0751   PLT 274 01/12/2017 0751   MCV 86.0 01/12/2017 0751   MCH 29.6 01/12/2017 0751   MCH 30.9 04/29/2016 1032   MCHC 34.5 01/12/2017 0751   MCHC 34.9 04/29/2016 1032   RDW 14.7 (H) 01/12/2017 0751   LYMPHSABS 1.5 01/12/2017 0751   MONOABS 0.6 01/12/2017 0751   EOSABS 0.1 01/12/2017 0751   BASOSABS 0.1 01/12/2017 0751    CMP     Component Value Date/Time   NA 143 01/12/2017 0751   K 4.2 01/12/2017 0751   CL 102 04/29/2016 1032   CO2 26 01/12/2017 0751   GLUCOSE 94 01/12/2017 0751   BUN 13.4 01/12/2017 0751   CREATININE 0.7 01/12/2017 0751   CALCIUM 10.2 01/12/2017 0751   PROT 7.5 01/12/2017 0751   ALBUMIN 3.9 01/12/2017 0751   AST 18 01/12/2017 0751   ALT 10 01/12/2017 0751   ALKPHOS 232 (H) 01/12/2017 0751   BILITOT 0.33 01/12/2017 0751   GFRNONAA >60 04/29/2016 1032   GFRAA >60 04/29/2016 1032    RADIOGRAPHIC STUDIES:  Ct Chest W Contrast  Result Date: 01/22/2017 CLINICAL DATA:  Restaging of lung cancer. Chemotherapy completed 04/22/2016. Radiation therapy completed 03/03/2016. Cough. Shortness of breath. Chest pain with deep inspiration for 1 week. Stage III adenocarcinoma of right lung. EXAM: CT CHEST WITH CONTRAST TECHNIQUE: Multidetector CT imaging  of the chest was performed during intravenous contrast administration. CONTRAST:  1mL ISOVUE-300 IOPAMIDOL (ISOVUE-300) INJECTION 61% COMPARISON:  10/13/2016 FINDINGS: Cardiovascular: Tortuous thoracic aorta. Aortic and branch vessel atherosclerosis. Normal heart size, without pericardial effusion. Lad coronary artery atherosclerosis. No central pulmonary embolism, on this non-dedicated study.  Pulmonary artery enlargement, outflow tract 3.1 cm. Mediastinum/Nodes: No supraclavicular adenopathy. 11 mm subcarinal node on image 78/series 2, midly enlarged from 7 mm on the prior. This is not pathologic by size criteria. No hilar adenopathy. Lungs/Pleura: No pleural fluid. Advanced bullous type emphysema. Lower lobe predominant bronchial wall thickening. Slight improvement in right middle lobe volume loss with scarring and atelectasis. Anterior right lower lobe spiculated density measures 9 mm on image 134/series 7 and 10 mm on sagittal image 35. This is new. Superior segment right lower lobe nodular density measures 8 mm on image 73/series 7 versus 3 mm on the prior. There is also posterior right upper lobe perifissural nodularity at 6 mm on image 75/series 7 which is similar to mildly enlarged from 4 mm on the prior. Upper Abdomen: Normal imaged portions of the liver, spleen, stomach, pancreas, adrenal glands, kidneys. Musculoskeletal: Osteopenia.  No focal osseous lesion. IMPRESSION: 1. New and enlarging right-sided spiculated pulmonary nodules, suspicious for metastatic disease. 2. Improvement in right middle lobe volume loss and atelectasis. 3. A subcarinal node is not pathologic by size criteria but may have enlarged in the interval. Recommend attention on follow-up. 4. Coronary artery atherosclerosis. Aortic Atherosclerosis (ICD10-I70.0). 5. Pulmonary artery enlargement suggests pulmonary arterial hypertension. 6.  Emphysema (ICD10-J43.9). Electronically Signed   By: Abigail Miyamoto M.D.   On: 01/22/2017 09:56     ASSESSMENT and THERAPY PLAN:   Adenocarcinoma of right lung, stage 3 (HCC) This is a very pleasant 66 year old African-American female with a stage IIIa non-small cell lung cancer status post concurrent chemoradiation followed by consolidation chemotherapy with carboplatin and paclitaxel for 3 cycles and currently on observation. The patient is doing fine today. She was seen by Dr. Julien Nordmann. CT scan results were reviewed with the patient. Explained to the patient that there are some new and slightly enlarging pulmonary nodules, but these are very small in size. It is not clear this represents progressive disease versus inflammation. Recommend that we monitor her more closely. Plan is for a repeat CT scan of the chest in approximately 2 months.  The patient was advised to call immediately if she has any concerning symptoms in the interval. The patient voices understanding of current disease status and treatment options and is in agreement with the current care plan. All questions were answered. The patient knows to call the clinic with any problems, questions or concerns. We can certainly see the patient much sooner if necessary.   Orders Placed This Encounter  Procedures  . CT CHEST W CONTRAST    Standing Status:   Future    Standing Expiration Date:   01/29/2018    Order Specific Question:   If indicated for the ordered procedure, I authorize the administration of contrast media per Radiology protocol    Answer:   Yes    Order Specific Question:   Preferred imaging location?    Answer:   [facility]    Order Specific Question:   Radiology Contrast Protocol - do NOT remove file path    Answer:   [email or web address]    Order Specific Question:   Reason for Exam additional comments    Answer:   Lung cancer. Restaging.  Marland Kitchen CBC with Differential/Platelet    Standing Status:   Future    Standing Expiration Date:   01/29/2018  . Comprehensive metabolic panel     Standing Status:   Future    Standing Expiration Date:   01/29/2018    All questions  were answered. The patient knows to call the clinic with any problems, questions or concerns. We can certainly see the patient much sooner if necessary.  Mikey Bussing, NP 01/30/2017   ADDENDUM: Hematology/Oncology Attending: I had a face to face encounter with the patient. I recommended her care plan. This is a very pleasant 66 years old African-American female with history of stage IIIa non-small cell lung cancer status post a course of concurrent chemoradiation with weekly carboplatin and paclitaxel followed by consolidation chemotherapy with 3 cycles of carboplatin and paclitaxel. The patient is currently on observation and has been doing fine with no specific complaints. She had repeat CT scan of the chest performed recently. I personally and independently reviewed the scan images and discuss the results with the patient and her family member. Her scan showed new and enlarging right-sided spiculated pulmonary nodule suspicious for metastatic disease but these nodules are very small at this point. I discussed with the patient options for management of her condition including treatment with systemic chemotherapy or immunotherapy versus close observation and monitoring with repeat CT scan of the chest in 2 months for reevaluation of her disease. The patient would like to continue on observation for now. I would see her back for follow-up visit in 2 months with repeat CT scan of the chest. She was advised to call immediately if she has any concerning symptoms in the interval.  Disclaimer: This note was dictated with voice recognition software. Similar sounding words can inadvertently be transcribed and may be missed upon review. Eilleen Kempf, MD 01/31/17

## 2017-01-30 NOTE — Assessment & Plan Note (Signed)
This is a very pleasant 66 year old African-American female with a stage IIIa non-small cell lung cancer status post concurrent chemoradiation followed by consolidation chemotherapy with carboplatin and paclitaxel for 3 cycles and currently on observation. The patient is doing fine today. She was seen by Dr. Julien Nordmann. CT scan results were reviewed with the patient. Explained to the patient that there are some new and slightly enlarging pulmonary nodules, but these are very small in size. It is not clear this represents progressive disease versus inflammation. Recommend that we monitor her more closely. Plan is for a repeat CT scan of the chest in approximately 2 months.  The patient was advised to call immediately if she has any concerning symptoms in the interval. The patient voices understanding of current disease status and treatment options and is in agreement with the current care plan. All questions were answered. The patient knows to call the clinic with any problems, questions or concerns. We can certainly see the patient much sooner if necessary.

## 2017-02-27 ENCOUNTER — Telehealth: Payer: Self-pay

## 2017-02-27 ENCOUNTER — Other Ambulatory Visit: Payer: Self-pay

## 2017-02-27 DIAGNOSIS — K3189 Other diseases of stomach and duodenum: Secondary | ICD-10-CM

## 2017-02-27 NOTE — Telephone Encounter (Signed)
EUS scheduled, pt instructed and medications reviewed.  Patient instructions mailed to home.  Patient to call with any questions or concerns.  

## 2017-03-11 ENCOUNTER — Encounter (HOSPITAL_COMMUNITY): Payer: Self-pay | Admitting: *Deleted

## 2017-03-11 ENCOUNTER — Other Ambulatory Visit: Payer: Self-pay

## 2017-03-11 NOTE — Progress Notes (Signed)
Spoke with dr Orene Desanctis anesthesia and made aware 04-29-16 ekg results and 02-27-16 ekg results and 05-10-16 echo results, and pt medical history.  pt ok for procedure per dr Orene Desanctis

## 2017-03-12 ENCOUNTER — Ambulatory Visit (HOSPITAL_COMMUNITY)
Admission: RE | Admit: 2017-03-12 | Discharge: 2017-03-12 | Disposition: A | Discharge status: Home / Self Care | Payer: Medicare HMO | Source: Ambulatory Visit | Attending: Gastroenterology | Admitting: Gastroenterology

## 2017-03-12 ENCOUNTER — Ambulatory Visit (HOSPITAL_COMMUNITY): Payer: Medicare HMO | Admitting: Anesthesiology

## 2017-03-12 ENCOUNTER — Other Ambulatory Visit: Payer: Self-pay

## 2017-03-12 ENCOUNTER — Encounter (HOSPITAL_COMMUNITY): Payer: Self-pay | Admitting: Emergency Medicine

## 2017-03-12 ENCOUNTER — Encounter (HOSPITAL_COMMUNITY)
Admission: RE | Disposition: A | Discharge status: Home / Self Care | Payer: Self-pay | Source: Ambulatory Visit | Attending: Gastroenterology

## 2017-03-12 DIAGNOSIS — M1990 Unspecified osteoarthritis, unspecified site: Secondary | ICD-10-CM | POA: Diagnosis not present

## 2017-03-12 DIAGNOSIS — Z85118 Personal history of other malignant neoplasm of bronchus and lung: Secondary | ICD-10-CM | POA: Diagnosis not present

## 2017-03-12 DIAGNOSIS — I10 Essential (primary) hypertension: Secondary | ICD-10-CM | POA: Insufficient documentation

## 2017-03-12 DIAGNOSIS — F329 Major depressive disorder, single episode, unspecified: Secondary | ICD-10-CM | POA: Diagnosis not present

## 2017-03-12 DIAGNOSIS — K219 Gastro-esophageal reflux disease without esophagitis: Secondary | ICD-10-CM | POA: Diagnosis not present

## 2017-03-12 DIAGNOSIS — Z9981 Dependence on supplemental oxygen: Secondary | ICD-10-CM | POA: Insufficient documentation

## 2017-03-12 DIAGNOSIS — I252 Old myocardial infarction: Secondary | ICD-10-CM | POA: Diagnosis not present

## 2017-03-12 DIAGNOSIS — K3189 Other diseases of stomach and duodenum: Secondary | ICD-10-CM | POA: Diagnosis not present

## 2017-03-12 DIAGNOSIS — J449 Chronic obstructive pulmonary disease, unspecified: Secondary | ICD-10-CM | POA: Diagnosis not present

## 2017-03-12 DIAGNOSIS — G8929 Other chronic pain: Secondary | ICD-10-CM | POA: Insufficient documentation

## 2017-03-12 DIAGNOSIS — Z87891 Personal history of nicotine dependence: Secondary | ICD-10-CM | POA: Diagnosis not present

## 2017-03-12 DIAGNOSIS — I2510 Atherosclerotic heart disease of native coronary artery without angina pectoris: Secondary | ICD-10-CM | POA: Insufficient documentation

## 2017-03-12 HISTORY — PX: EUS: SHX5427

## 2017-03-12 HISTORY — DX: Gastro-esophageal reflux disease without esophagitis: K21.9

## 2017-03-12 SURGERY — UPPER ENDOSCOPIC ULTRASOUND (EUS) RADIAL
Anesthesia: Monitor Anesthesia Care

## 2017-03-12 MED ORDER — LACTATED RINGERS IV SOLN
INTRAVENOUS | Status: DC | PRN
  Administered 2017-03-12: 08:00:00 via INTRAVENOUS

## 2017-03-12 MED ORDER — LIDOCAINE 2% (20 MG/ML) 5 ML SYRINGE
INTRAMUSCULAR | Status: AC
  Filled 2017-03-12: qty 5

## 2017-03-12 MED ORDER — PROPOFOL 10 MG/ML IV BOLUS
INTRAVENOUS | Status: DC | PRN
  Administered 2017-03-12: 20 mg via INTRAVENOUS
  Administered 2017-03-12 (×2): 30 mg via INTRAVENOUS

## 2017-03-12 MED ORDER — PROPOFOL 10 MG/ML IV BOLUS
INTRAVENOUS | Status: AC
  Filled 2017-03-12: qty 40

## 2017-03-12 MED ORDER — LIDOCAINE 2% (20 MG/ML) 5 ML SYRINGE
INTRAMUSCULAR | Status: DC | PRN
  Administered 2017-03-12: 50 mg via INTRAVENOUS

## 2017-03-12 NOTE — Discharge Instructions (Signed)
Esophagogastroduodenoscopy, Care After °Refer to this sheet in the next few weeks. These instructions provide you with information about caring for yourself after your procedure. Your health care provider may also give you more specific instructions. Your treatment has been planned according to current medical practices, but problems sometimes occur. Call your health care provider if you have any problems or questions after your procedure. °What can I expect after the procedure? °After the procedure, it is common to have: °· A sore throat. °· Nausea. °· Bloating. °· Dizziness. °· Fatigue. ° °Follow these instructions at home: °· Do not eat or drink anything until the numbing medicine (local anesthetic) has worn off and your gag reflex has returned. You will know that the local anesthetic has worn off when you can swallow comfortably. °· Do not drive for 24 hours if you received a medicine to help you relax (sedative). °· If your health care provider took a tissue sample for testing during the procedure, make sure to get your test results. This is your responsibility. Ask your health care provider or the department performing the test when your results will be ready. °· Keep all follow-up visits as told by your health care provider. This is important. °Contact a health care provider if: °· You cannot stop coughing. °· You are not urinating. °· You are urinating less than usual. °Get help right away if: °· You have trouble swallowing. °· You cannot eat or drink. °· You have throat or chest pain that gets worse. °· You are dizzy or light-headed. °· You faint. °· You have nausea or vomiting. °· You have chills. °· You have a fever. °· You have severe abdominal pain. °· You have black, tarry, or bloody stools. °This information is not intended to replace advice given to you by your health care provider. Make sure you discuss any questions you have with your health care provider. °Document Released: 03/17/2012 Document  Revised: 09/06/2015 Document Reviewed: 02/22/2015 °Elsevier Interactive Patient Education © 2018 Elsevier Inc. ° °

## 2017-03-12 NOTE — H&P (Signed)
HPI: This is a woman with gastric mass noted 2017 by EGD, then EUS evaluation  Chief complaint is gastric mass  Here for surveillance evaluation  ROS: complete GI ROS as described in HPI, all other review negative.  Constitutional:  No unintentional weight loss   Past Medical History:  Diagnosis Date  . Anemia    as a young woman  . Arthritis    osteoartritis  . Asthma   . Brain tumor (benign) ([place]) 2005 [facility]   Benign  . Chronic headaches   . Chronic hip pain   . Chronic pain   . COPD (chronic obstructive pulmonary disease) ([place])   . Coronary artery disease   . Depression   . Depression 05/15/2016  . Encounter for antineoplastic chemotherapy 01/10/2016  . GERD (gastroesophageal reflux disease)   . Hypertension   . Lung cancer ([place]) dx'd 01/2016   radiation and chemo done, none now  . NSTEMI (non-ST elevated myocardial infarction) ([place]) yrs ago  . On home O2    qhs 2 liters at hs and prn  . Pneumonia last time 2 yrs ago  . Shortness of breath dyspnea    with activity    Past Surgical History:  Procedure Laterality Date  . CHOLECYSTECTOMY    . COLONOSCOPY  2015   Results requested from [facility]  . COLONOSCOPY    . ESOPHAGOGASTRODUODENOSCOPY N/A 08/14/2015   Procedure: ESOPHAGOGASTRODUODENOSCOPY (EGD);  Surgeon: Daneil Dolin, MD;  Location[facility];  Service: Endoscopy;  Laterality: N/A;  215   . ESOPHAGOGASTRODUODENOSCOPY (EGD) WITH PROPOFOL N/A 09/13/2015   Procedure: ESOPHAGOGASTRODUODENOSCOPY (EGD) WITH PROPOFOL;  Surgeon: Milus Banister, MD;  Location: [facility]COPY;  Service: Endoscopy;  Laterality: N/A;  . TUMOR REMOVAL  2005   Benign  . UPPER ESOPHAGEAL ENDOSCOPIC ULTRASOUND (EUS)  09/13/2015   Procedure: UPPER ESOPHAGEAL ENDOSCOPIC ULTRASOUND (EUS);  Surgeon: Milus Banister, MD;  Location: [facility];  Service: Endoscopy;;  . VIDEO BRONCHOSCOPY WITH ENDOBRONCHIAL NAVIGATION N/A 12/31/2015   Procedure: VIDEO BRONCHOSCOPY WITH  ENDOBRONCHIAL NAVIGATION;  Surgeon: Melrose Nakayama, MD;  Location: [facility];  Service: Thoracic;  Laterality: N/A;  . VIDEO BRONCHOSCOPY WITH ENDOBRONCHIAL ULTRASOUND N/A 11/08/2015   Procedure: VIDEO BRONCHOSCOPY WITH ENDOBRONCHIAL ULTRASOUND;  Surgeon: Ivin Poot, MD;  Location: [facility] OR;  Service: Thoracic;  Laterality: N/A;    No current facility-administered medications for this encounter.     Allergies as of 02/27/2017 - Review Complete 01/30/2017  Allergen Reaction Noted  . No known allergies  11/07/2015    Family History  Problem Relation Age of Onset  . Ovarian cancer Mother   . Lung cancer Father   . Lung cancer Brother   . Lung cancer Brother   . Prostate cancer Brother   . Brain cancer Sister        Half sister    Social History   Socioeconomic History  . Marital status: Single    Spouse name: Not on file  . Number of children: Not on file  . Years of education: 10th grade  . Highest education level: Not on file  Social Needs  . Financial resource strain: Not on file  . Food insecurity - worry: Not on file  . Food insecurity - inability: Not on file  . Transportation needs - medical: Not on file  . Transportation needs - non-medical: Not on file  Occupational History  . Occupation: retired  Tobacco Use  . Smoking status: Former Smoker  Packs/day: 0.25    Years: 38.00    Pack years: 9.50    Types: Cigarettes    Last attempt to quit: 01/15/2016    Years since quitting: 1.1  . Smokeless tobacco: Never Used  . Tobacco comment: Pt has asked doctor for med to help   Substance and Sexual Activity  . Alcohol use: No    Alcohol/week: 0.0 oz  . Drug use: No  . Sexual activity: No  Other Topics Concern  . Not on file  Social History Narrative  . Not on file     Physical Exam: BP 121/87   Temp 99.2 F (37.3 C) (Oral)   Resp 19   Ht 5\' 7"  (1.702 m)   Wt 133 lb (60.3 kg)   SpO2 93%   BMI 20.83 kg/m  Constitutional: generally  well-appearing Psychiatric: alert and oriented x3 Abdomen: soft, nontender, nondistended, no obvious ascites, no peritoneal signs, normal bowel sounds No peripheral edema noted in lower extremities  Assessment and plan: 66 y.o. female with gastric mass  Here for surveillance evaluation.  I discussed with her that the mass is very unlikely related to her lung cancer and in the current setting it would be reasonable to forgo this surveillance evaluation however she really wants me to look to make sure it has not changed, grown.  Please see the "Patient Instructions" section for addition details about the plan.  Owens Loffler, MD [facility] 03/12/2017, 8:03 AM

## 2017-03-12 NOTE — Transfer of Care (Signed)
Immediate Anesthesia Transfer of Care Note  Patient: Courtney Zuniga  Procedure(s) Performed: UPPER ENDOSCOPIC ULTRASOUND (EUS) RADIAL (N/A )  Patient Location: PACU  Anesthesia Type:MAC  Level of Consciousness: awake, alert  and oriented  Airway & Oxygen Therapy: Patient Spontanous Breathing and Patient connected to nasal cannula oxygen  Post-op Assessment: Report given to RN and Post -op Vital signs reviewed and stable  Post vital signs: Reviewed and stable  Last Vitals:  Vitals:   03/12/17 0720  BP: 121/87  Resp: 19  Temp: 37.3 C  SpO2: 93%    Last Pain:  Vitals:   03/12/17 0720  TempSrc: Oral         Complications: No apparent anesthesia complications

## 2017-03-12 NOTE — Anesthesia Preprocedure Evaluation (Signed)
Anesthesia Evaluation  Patient identified by MRN, date of birth, ID band Patient awake    Reviewed: Allergy & Precautions, NPO status , Patient's Chart, lab work & pertinent test results  Airway Mallampati: II  TM Distance: >3 FB Neck ROM: Full    Dental no notable dental hx.    Pulmonary neg pulmonary ROS, shortness of breath and at rest, asthma , COPD, former smoker,     + decreased breath sounds      Cardiovascular hypertension, + CAD and + Past MI  negative cardio ROS Normal cardiovascular exam Rhythm:Regular Rate:Normal     Neuro/Psych negative neurological ROS  negative psych ROS   GI/Hepatic negative GI ROS, Neg liver ROS,   Endo/Other  negative endocrine ROS  Renal/GU negative Renal ROS  negative genitourinary   Musculoskeletal negative musculoskeletal ROS (+)   Abdominal   Peds negative pediatric ROS (+)  Hematology negative hematology ROS (+)   Anesthesia Other Findings   Reproductive/Obstetrics negative OB ROS                             Anesthesia Physical Anesthesia Plan  ASA: IV  Anesthesia Plan: MAC   Post-op Pain Management:    Induction: Intravenous  PONV Risk Score and Plan: 0  Airway Management Planned: Simple Face Mask  Additional Equipment:   Intra-op Plan:   Post-operative Plan:   Informed Consent: I have reviewed the patients History and Physical, chart, labs and discussed the procedure including the risks, benefits and alternatives for the proposed anesthesia with the patient or authorized representative who has indicated his/her understanding and acceptance.   Dental advisory given  Plan Discussed with: CRNA and Surgeon  Anesthesia Plan Comments:         Anesthesia Quick Evaluation

## 2017-03-12 NOTE — Op Note (Signed)
[facility] Patient Name: Courtney Zuniga Procedure Date: 03/12/2017 MRN: [id number] Attending MD: Milus Banister , MD Date of Birth: 10/05/1950 CSN: [id number] Age: 66 Admit Type: Outpatient Procedure:                Upper EUS Indications:              Surveillance of gastric mass; noted by Dr. Gala Romney                            2017 during EGD; EUS Dr. Ardis Hughs 09/2015 39mm oval                            lesion that communicated with MP layer, tunnel                            biopsies non-diagnostic Providers:                Milus Banister, MD, Cleda Daub, RN, William Dalton, Technician Referring MD:              Medicines:                Monitored Anesthesia Care Complications:            No immediate complications. Estimated blood loss:                            None. Estimated Blood Loss:     Estimated blood loss: none. Procedure:                Pre-Anesthesia Assessment:                           - Prior to the procedure, a History and Physical                            was performed, and patient medications and                            allergies were reviewed. The patient's tolerance of                            previous anesthesia was also reviewed. The risks                            and benefits of the procedure and the sedation                            options and risks were discussed with the patient.                            All questions were answered, and informed consent                            was  obtained. Prior Anticoagulants: The patient has                            taken no previous anticoagulant or antiplatelet                            agents. ASA Grade Assessment: IV - A patient with                            severe systemic disease that is a constant threat                            to life. After reviewing the risks and benefits,                            the patient was deemed in  satisfactory condition to                            undergo the procedure.                           After obtaining informed consent, the endoscope was                            passed under direct vision. Throughout the                            procedure, the patient's blood pressure, pulse, and                            oxygen saturations were monitored continuously. The                            [id number] ([id number]) scope was introduced through                            the mouth, and advanced to the antrum of the                            stomach. The upper EUS was accomplished without                            difficulty. The patient tolerated the procedure                            well. Scope In: Scope Out: Findings:      Endoscopic Finding :      1. The examined esophagus was endoscopically normal.      2. There was a small amount retained soft food in the stomach.      3. Along the lesser curvature of the fundus, the previously noted       submucosal mass was again located. It is round with normal overlying       gastric mucosa, about 1-2cm across endoscopically.  Endosonographic Finding :      1. The gastric submucosal lesion described above correlated with a       round, hypoechoic mass that clearly communicates with the muscularis       propria layer of the gastric wall and measures 20mm across.      2. No perigastric adenopathy.      3. Limited evaluation of the spleen, pancreas, liver were all normal. Impression:               - Gastric fundus submucosal lesion that is                            unchanged from previous evaluation 18 months ago.                           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 Moderate Sedation:      N/A- Per Anesthesia Care Recommendation:           - Discharge patient to home.                           - Given mounting cormorbid conditions, no need for                            further surveillance. Procedure  Code(s):        --- Professional ---                           [phone], 37, Esophagogastroduodenoscopy, flexible,                            transoral; with endoscopic ultrasound examination                            limited to the esophagus, stomach or duodenum, and                            adjacent structures Diagnosis Code(s):        --- Professional ---                           K31.89, Other diseases of stomach and duodenum CPT copyright 2016 American Medical Association. All rights reserved. The codes documented in this report are preliminary and upon coder review may  be revised to meet current compliance requirements. Milus Banister, MD 03/12/2017 9:17:42 AM This report has been signed electronically. Number of Addenda: 0

## 2017-03-12 NOTE — Anesthesia Postprocedure Evaluation (Signed)
Anesthesia Post Note  Patient: Courtney Zuniga  Procedure(s) Performed: UPPER ENDOSCOPIC ULTRASOUND (EUS) RADIAL (N/A )     Patient location during evaluation: PACU Anesthesia Type: MAC Level of consciousness: awake and alert Pain management: pain level controlled Vital Signs Assessment: post-procedure vital signs reviewed and stable Respiratory status: spontaneous breathing, nonlabored ventilation, respiratory function stable and patient connected to nasal cannula oxygen Cardiovascular status: stable and blood pressure returned to baseline Postop Assessment: no apparent nausea or vomiting Anesthetic complications: no    Last Vitals:  Vitals:   03/12/17 0720 03/12/17 0912  BP: 121/87 123/71  Pulse:  (!) 112  Resp: 19 (!) 38  Temp: 37.3 C   SpO2: 93% 96%    Last Pain:  Vitals:   03/12/17 0720  TempSrc: Oral                 Derricka Mertz S

## 2017-03-16 ENCOUNTER — Encounter (HOSPITAL_COMMUNITY): Payer: Self-pay | Admitting: Gastroenterology

## 2017-03-31 ENCOUNTER — Ambulatory Visit (HOSPITAL_COMMUNITY): Payer: Medicare HMO

## 2017-03-31 ENCOUNTER — Other Ambulatory Visit: Payer: Medicare HMO

## 2017-04-02 ENCOUNTER — Ambulatory Visit (HOSPITAL_COMMUNITY): Payer: Medicare HMO

## 2017-04-02 ENCOUNTER — Other Ambulatory Visit: Payer: Medicare HMO

## 2017-04-06 ENCOUNTER — Ambulatory Visit: Payer: Medicare HMO | Admitting: Internal Medicine

## 2017-04-09 ENCOUNTER — Telehealth: Payer: Self-pay | Admitting: *Deleted

## 2017-04-09 ENCOUNTER — Other Ambulatory Visit (HOSPITAL_BASED_OUTPATIENT_CLINIC_OR_DEPARTMENT_OTHER): Payer: Medicare HMO

## 2017-04-09 ENCOUNTER — Ambulatory Visit (HOSPITAL_COMMUNITY): Payer: Medicare HMO

## 2017-04-09 DIAGNOSIS — C3491 Malignant neoplasm of unspecified part of right bronchus or lung: Secondary | ICD-10-CM

## 2017-04-09 DIAGNOSIS — C342 Malignant neoplasm of middle lobe, bronchus or lung: Secondary | ICD-10-CM

## 2017-04-09 LAB — CBC WITH DIFFERENTIAL/PLATELET
BASO%: 0.8 % (ref 0.0–2.0)
BASOS ABS: 0.1 10*3/uL (ref 0.0–0.1)
EOS ABS: 0.1 10*3/uL (ref 0.0–0.5)
EOS%: 1.3 % (ref 0.0–7.0)
HEMATOCRIT: 42.7 % (ref 34.8–46.6)
HGB: 14.3 g/dL (ref 11.6–15.9)
LYMPH#: 1.9 10*3/uL (ref 0.9–3.3)
LYMPH%: 22.5 % (ref 14.0–49.7)
MCH: 29.1 pg (ref 25.1–34.0)
MCHC: 33.6 g/dL (ref 31.5–36.0)
MCV: 86.8 fL (ref 79.5–101.0)
MONO#: 0.6 10*3/uL (ref 0.1–0.9)
MONO%: 7 % (ref 0.0–14.0)
NEUT#: 5.9 10*3/uL (ref 1.5–6.5)
NEUT%: 68.4 % (ref 38.4–76.8)
Platelets: 321 10*3/uL (ref 145–400)
RBC: 4.92 10*6/uL (ref 3.70–5.45)
RDW: 15.9 % — ABNORMAL HIGH (ref 11.2–14.5)
WBC: 8.7 10*3/uL (ref 3.9–10.3)

## 2017-04-09 LAB — COMPREHENSIVE METABOLIC PANEL
ALT: 13 U/L (ref 0–55)
AST: 12 U/L (ref 5–34)
Albumin: 3.5 g/dL (ref 3.5–5.0)
Alkaline Phosphatase: 216 U/L — ABNORMAL HIGH (ref 40–150)
Anion Gap: 10 mEq/L (ref 3–11)
BILIRUBIN TOTAL: 0.22 mg/dL (ref 0.20–1.20)
BUN: 16.8 mg/dL (ref 7.0–26.0)
CO2: 25 meq/L (ref 22–29)
Calcium: 9.1 mg/dL (ref 8.4–10.4)
Chloride: 110 mEq/L — ABNORMAL HIGH (ref 98–109)
Creatinine: 0.8 mg/dL (ref 0.6–1.1)
GLUCOSE: 89 mg/dL (ref 70–140)
Potassium: 4.3 mEq/L (ref 3.5–5.1)
SODIUM: 144 meq/L (ref 136–145)
TOTAL PROTEIN: 6.8 g/dL (ref 6.4–8.3)

## 2017-04-09 NOTE — Telephone Encounter (Signed)
Oncology Nurse Navigator Documentation  Oncology Nurse Navigator Flowsheets 04/09/2017  Navigator Location [facility]  Navigator Encounter Type Telephone/I followed up on Courtney Zuniga schedule.  Her Ct scan was not scheduled though they tried to call her.  I called and spoke with patient. She only has transportation on M and Thursday's.  I called central scheduling to get an appt for Ct. I called patient with ct appt and pre-procedure instructions as well as follow up appt with Courtney Bussing NP with Dr. Julien Nordmann. She verbalized understanding of appts  Telephone Outgoing Call  Treatment Phase Follow-up  Barriers/Navigation Needs Coordination of Care;Education  Education Other  Interventions Coordination of Care;Education  Coordination of Care Appts  Acuity Level 2  Acuity Level 2 Assistance expediting appointments  Time Spent with Patient 22

## 2017-04-13 ENCOUNTER — Ambulatory Visit: Payer: Medicare HMO | Admitting: Internal Medicine

## 2017-04-17 ENCOUNTER — Other Ambulatory Visit: Payer: Medicare HMO

## 2017-04-20 ENCOUNTER — Ambulatory Visit: Payer: Medicare HMO | Admitting: Internal Medicine

## 2017-04-20 ENCOUNTER — Ambulatory Visit (HOSPITAL_COMMUNITY)
Admission: RE | Admit: 2017-04-20 | Discharge: 2017-04-20 | Disposition: A | Discharge status: Home / Self Care | Payer: Medicare HMO | Source: Ambulatory Visit | Attending: Internal Medicine | Admitting: Internal Medicine

## 2017-04-20 DIAGNOSIS — J439 Emphysema, unspecified: Secondary | ICD-10-CM | POA: Diagnosis not present

## 2017-04-20 DIAGNOSIS — C3491 Malignant neoplasm of unspecified part of right bronchus or lung: Secondary | ICD-10-CM | POA: Diagnosis present

## 2017-04-20 DIAGNOSIS — I700 Atherosclerosis of aorta: Secondary | ICD-10-CM | POA: Diagnosis not present

## 2017-04-20 DIAGNOSIS — I2510 Atherosclerotic heart disease of native coronary artery without angina pectoris: Secondary | ICD-10-CM | POA: Insufficient documentation

## 2017-04-20 MED ORDER — IOPAMIDOL (ISOVUE-300) INJECTION 61%
INTRAVENOUS | Status: AC
  Filled 2017-04-20: qty 75

## 2017-04-20 MED ORDER — IOPAMIDOL (ISOVUE-300) INJECTION 61%
75.0000 mL | Freq: Once | INTRAVENOUS | Status: AC | PRN
  Administered 2017-04-20: 75 mL via INTRAVENOUS

## 2017-04-23 ENCOUNTER — Telehealth: Payer: Self-pay | Admitting: Internal Medicine

## 2017-04-23 ENCOUNTER — Encounter: Payer: Self-pay | Admitting: *Deleted

## 2017-04-23 ENCOUNTER — Encounter: Payer: Self-pay | Admitting: Oncology

## 2017-04-23 ENCOUNTER — Inpatient Hospital Stay: Payer: Medicare HMO | Attending: Oncology | Admitting: Oncology

## 2017-04-23 ENCOUNTER — Other Ambulatory Visit: Payer: Self-pay | Admitting: Internal Medicine

## 2017-04-23 VITALS — BP 124/78 | HR 90 | Temp 98.6°F | Resp 19 | Ht 67.0 in | Wt 142.7 lb

## 2017-04-23 DIAGNOSIS — Z79899 Other long term (current) drug therapy: Secondary | ICD-10-CM | POA: Insufficient documentation

## 2017-04-23 DIAGNOSIS — Z9221 Personal history of antineoplastic chemotherapy: Secondary | ICD-10-CM

## 2017-04-23 DIAGNOSIS — Z5112 Encounter for antineoplastic immunotherapy: Secondary | ICD-10-CM

## 2017-04-23 DIAGNOSIS — Z923 Personal history of irradiation: Secondary | ICD-10-CM

## 2017-04-23 DIAGNOSIS — R911 Solitary pulmonary nodule: Secondary | ICD-10-CM | POA: Diagnosis not present

## 2017-04-23 DIAGNOSIS — H538 Other visual disturbances: Secondary | ICD-10-CM | POA: Insufficient documentation

## 2017-04-23 DIAGNOSIS — C3412 Malignant neoplasm of upper lobe, left bronchus or lung: Secondary | ICD-10-CM | POA: Diagnosis not present

## 2017-04-23 DIAGNOSIS — C3491 Malignant neoplasm of unspecified part of right bronchus or lung: Secondary | ICD-10-CM

## 2017-04-23 DIAGNOSIS — R51 Headache: Secondary | ICD-10-CM | POA: Diagnosis not present

## 2017-04-23 NOTE — Patient Instructions (Signed)
Nivolumab injection What is this medicine? NIVOLUMAB (nye VOL ue mab) is a monoclonal antibody. It is used to treat melanoma, lung cancer, kidney cancer, head and neck cancer, Hodgkin lymphoma, urothelial cancer, colon cancer, and liver cancer. This medicine may be used for other purposes; ask your health care provider or pharmacist if you have questions. COMMON BRAND NAME(S): Opdivo What should I tell my health care provider before I take this medicine? They need to know if you have any of these conditions: -diabetes -immune system problems -kidney disease -liver disease -lung disease -organ transplant -stomach or intestine problems -thyroid disease -an unusual or allergic reaction to nivolumab, other medicines, foods, dyes, or preservatives -pregnant or trying to get pregnant -breast-feeding How should I use this medicine? This medicine is for infusion into a vein. It is given by a health care professional in a hospital or clinic setting. A special MedGuide will be given to you before each treatment. Be sure to read this information carefully each time. Talk to your pediatrician regarding the use of this medicine in children. While this drug may be prescribed for children as young as 12 years for selected conditions, precautions do apply. Overdosage: If you think you have taken too much of this medicine contact a poison control center or emergency room at once. NOTE: This medicine is only for you. Do not share this medicine with others. What if I miss a dose? It is important not to miss your dose. Call your doctor or health care professional if you are unable to keep an appointment. What may interact with this medicine? Interactions have not been studied. Give your health care provider a list of all the medicines, herbs, non-prescription drugs, or dietary supplements you use. Also tell them if you smoke, drink alcohol, or use illegal drugs. Some items may interact with your  medicine. This list may not describe all possible interactions. Give your health care provider a list of all the medicines, herbs, non-prescription drugs, or dietary supplements you use. Also tell them if you smoke, drink alcohol, or use illegal drugs. Some items may interact with your medicine. What should I watch for while using this medicine? This drug may make you feel generally unwell. Continue your course of treatment even though you feel ill unless your doctor tells you to stop. You may need blood work done while you are taking this medicine. Do not become pregnant while taking this medicine or for 5 months after stopping it. Women should inform their doctor if they wish to become pregnant or think they might be pregnant. There is a potential for serious side effects to an unborn child. Talk to your health care professional or pharmacist for more information. Do not breast-feed an infant while taking this medicine. What side effects may I notice from receiving this medicine? Side effects that you should report to your doctor or health care professional as soon as possible: -allergic reactions like skin rash, itching or hives, swelling of the face, lips, or tongue -black, tarry stools -blood in the urine -bloody or watery diarrhea -changes in vision -change in sex drive -changes in emotions or moods -chest pain -confusion -cough -decreased appetite -diarrhea -facial flushing -feeling faint or lightheaded -fever, chills -hair loss -hallucination, loss of contact with reality -headache -irritable -joint pain -loss of memory -muscle pain -muscle weakness -seizures -shortness of breath -signs and symptoms of high blood sugar such as dizziness; dry mouth; dry skin; fruity breath; nausea; stomach pain; increased hunger or thirst; increased   urination -signs and symptoms of kidney injury like trouble passing urine or change in the amount of urine -signs and symptoms of liver injury  like dark yellow or brown urine; general ill feeling or flu-like symptoms; light-colored stools; loss of appetite; nausea; right upper belly pain; unusually weak or tired; yellowing of the eyes or skin -stiff neck -swelling of the ankles, feet, hands -weight gain Side effects that usually do not require medical attention (report to your doctor or health care professional if they continue or are bothersome): -bone pain -constipation -tiredness -vomiting This list may not describe all possible side effects. Call your doctor for medical advice about side effects. You may report side effects to FDA at 1-800-FDA-1088. Where should I keep my medicine? This drug is given in a hospital or clinic and will not be stored at home. NOTE: This sheet is a summary. It may not cover all possible information. If you have questions about this medicine, talk to your doctor, pharmacist, or health care provider.  2018 Elsevier/Gold Standard (2016-01-07 17:49:34)  

## 2017-04-23 NOTE — Assessment & Plan Note (Signed)
This is a very pleasant [name] with a stage IIIa non-small cell lung cancer status post concurrent chemoradiation followed by consolidation chemotherapy with carboplatin and paclitaxel for 3 cycles and currently on observation.  The patient was seen with Dr. Julien Nordmann.  Restaging CT scan result was discussed with the patient.  CT showed some mild increase in her left upper lobe pulmonary nodule and 2 small nodules on the right which could represent pulmonary metastases.  Recommend that she restart treatment with immunotherapy consisting of Nivolumab 480 milligrams IV every 4 weeks. Adverse effects of this treatment were discussed including but not limited to immune mediated the skin rash, diarrhea, inflammation of the lung, kidney, liver, thyroid or other endocrine dysfunction.  The patient is agreeable to proceeding.  Anticipate first dose on 04/30/2017.  The patient is having increased headaches.  Her last MRI of the brain was performed in October 2017 which was negative for brain metastases.  We will obtain an MRI of the brain within 1-2 weeks given that she is having persistent headaches to rule out brain metastases.  Follow-up visit will be in approximately 5 weeks for evaluation prior to cycle 2 of her Nivolumab.  The patient was advised to call immediately if she has any concerning symptoms in the interval. The patient voices understanding of current disease status and treatment options and is in agreement with the current care plan. All questions were answered. The patient knows to call the clinic with any problems, questions or concerns. We can certainly see the patient much sooner if necessary.

## 2017-04-23 NOTE — Progress Notes (Signed)
DISCONTINUE ON PATHWAY REGIMEN - Non-Small Cell Lung     A cycle is every 21 days:     Paclitaxel        Dose Mod: None     Carboplatin        Dose Mod: None  **Always confirm dose/schedule in your pharmacy ordering system**    REASON: Other Reason PRIOR TREATMENT: LOS23: Carboplatin AUC=6 + Paclitaxel 200 mg/m2 q21 Days x 2-3 Cycles TREATMENT RESPONSE: Partial Response (PR)  START ON PATHWAY REGIMEN - Non-Small Cell Lung     A cycle is every 28 days:     Nivolumab   **Always confirm dose/schedule in your pharmacy ordering system**    Patient Characteristics: Stage IV Metastatic, Squamous, PS = 0, 1, Second Line, No Prior PD-1/PD-L1  Inhibitor and Immunotherapy Candidate AJCC T Category: T1b Current Disease Status: Distant Metastases AJCC N Category: N2 AJCC M Category: M1a AJCC 8 Stage Grouping: IVA Histology: Squamous Cell Line of therapy: Second Line PD-L1 Expression Status: Quantity Not Sufficient Performance Status: PS = 0, 1 Would you be surprised if this patient died  in the next year<= I would NOT be surprised if this patient died in the next year Immunotherapy Candidate Status: Candidate for Immunotherapy Prior Immunotherapy Status: No Prior PD-1/PD-L1 Inhibitor Intent of Therapy: Non-Curative / Palliative Intent, Discussed with Patient

## 2017-04-23 NOTE — Telephone Encounter (Signed)
Gave avs and calendar for february °

## 2017-04-23 NOTE — Progress Notes (Signed)
[facility] OFFICE PROGRESS NOTE  Barry Dienes, NP [place] [address]  DIAGNOSIS: Stage IIIA (T1b, N2, M0) non-small cell lung cancer favoring adenocarcinoma presented with right middle lobe pulmonary nodule, mediastinal lymphadenopathy and highly suspicious for small nodule in the left upper lobe that could change her stage to stage IV that could present another synchronous primary lesion in the left upper lobe. This was diagnosed in September 2017.  PRIOR THERAPY: 1) Concurrent chemoradiation with weekly carboplatin for AUC of 2 and paclitaxel 45 MG/M2 status post 6 cycles last dose was given 02/25/2016 with partial response. 2) Consolidation chemotherapy with carboplatin for AUC of 5 and paclitaxel 175 MG/M2 every 3 weeks with Neulasta support. First dose 04/22/2016. Status post 3 cycles.  CURRENT THERAPY: Nivolumab 480 mg IV given every 4 weeks.  First dose expected 04/30/2017.  INTERVAL HISTORY: Courtney Zuniga 67 y.o. female returns for routine follow-up visit by herself.  The patient is feeling fine today and has no specific complaints except for ongoing headaches.  She reports is been having headaches for the past 3-4 months.  She uses over-the-counter medications with minimal relief.  She reports some blurred vision with this.  She denies fevers and chills.  Denies chest pain, shortness breath, cough, hemoptysis.  Denies nausea, vomiting, constipation, diarrhea.  She had a recent restaging CT scan and is here to discuss the results.  MEDICAL HISTORY: Past Medical History:  Diagnosis Date  . Anemia    as a young woman  . Arthritis    osteoartritis  . Asthma   . Brain tumor (benign) ([facility]) 2005 [facility]   Benign  . Chronic headaches   . Chronic hip pain   . Chronic pain   . COPD (chronic obstructive pulmonary disease) ([facility])   . Coronary artery disease   . Depression   . Depression 05/15/2016  . Encounter for antineoplastic chemotherapy 01/10/2016  .  GERD (gastroesophageal reflux disease)   . Hypertension   . Lung cancer ([place]) dx'd 01/2016   radiation and chemo done, none now  . NSTEMI (non-ST elevated myocardial infarction) ([place]) yrs ago  . On home O2    qhs 2 liters at hs and prn  . Pneumonia last time 2 yrs ago  . Shortness of breath dyspnea    with activity    ALLERGIES:  has No Known Allergies.  MEDICATIONS:  Current Outpatient Medications  Medication Sig Dispense Refill  . albuterol (PROVENTIL HFA;VENTOLIN HFA) 108 (90 BASE) MCG/ACT inhaler Inhale 2 puffs into the lungs every 4 (four) hours as needed for shortness of breath.     Marland Kitchen albuterol (PROVENTIL) (2.5 MG/3ML) 0.083% nebulizer solution Take 2.5 mg by nebulization every 6 (six) hours as needed for wheezing or shortness of breath.    . Aspirin-Salicylamide-Caffeine (BC HEADACHE POWDER PO) Take 1 packet by mouth 2 (two) times daily as needed (headache).     . Calcium Carb-Cholecalciferol (CALCIUM/VITAMIN D PO) Take 1 tablet by mouth daily.    . cyclobenzaprine (FLEXERIL) 10 MG tablet Take 10 mg by mouth 3 (three) times daily.     . Fluticasone-Salmeterol (ADVAIR) 500-50 MCG/DOSE AEPB Inhale 1 puff into the lungs daily.     Marland Kitchen guaifenesin (ROBITUSSIN) 100 MG/5ML syrup Take 200 mg by mouth daily as needed for cough.    . meloxicam (MOBIC) 15 MG tablet Take 15 mg by mouth daily.    . metoprolol tartrate (LOPRESSOR) 25 MG tablet Take 1 tablet by mouth 2 (two) times  daily.    . mirtazapine (REMERON) 15 MG tablet Take 1 tablet (15 mg total) by mouth at bedtime. 30 tablet 0  . omeprazole (PRILOSEC) 20 MG capsule Take 20 mg by mouth daily.    Marland Kitchen OVER THE COUNTER MEDICATION Take 1 packet by mouth at bedtime as needed (sleep). BC powder pm    . prochlorperazine (COMPAZINE) 10 MG tablet TAKE 1 TABLET BY MOUTH EVERY 6 HOURS AS NEEDED FOR NAUSEA & VOMITING 30 tablet 0  . Pseudoeph-Doxylamine-DM-APAP (NYQUIL PO) Take 1 Dose by mouth at bedtime as needed (cough).    . sucralfate (CARAFATE)  1 g tablet Take 1 tablet (1 g total) by mouth 4 (four) times daily. (Patient taking differently: Take 1 g by mouth 2 (two) times daily. ) 12 tablet 0   No current facility-administered medications for this visit.     SURGICAL HISTORY:  Past Surgical History:  Procedure Laterality Date  . CHOLECYSTECTOMY    . COLONOSCOPY  2015   Results requested from [facility]  . COLONOSCOPY    . ESOPHAGOGASTRODUODENOSCOPY N/A 08/14/2015   Procedure: ESOPHAGOGASTRODUODENOSCOPY (EGD);  Surgeon: Daneil Dolin, MD;  Location[facility];  Service: Endoscopy;  Laterality: N/A;  215   . ESOPHAGOGASTRODUODENOSCOPY (EGD) WITH PROPOFOL N/A 09/13/2015   Procedure: ESOPHAGOGASTRODUODENOSCOPY (EGD) WITH PROPOFOL;  Surgeon: Milus Banister, MD;  Location: [facility];  Service: Endoscopy;  Laterality: N/A;  . EUS N/A 03/12/2017   Procedure: UPPER ENDOSCOPIC ULTRASOUND (EUS) RADIAL;  Surgeon: Milus Banister, MD;  Location: [facility];  Service: Endoscopy;  Laterality: N/A;  . TUMOR REMOVAL  2005   Benign  . UPPER ESOPHAGEAL ENDOSCOPIC ULTRASOUND (EUS)  09/13/2015   Procedure: UPPER ESOPHAGEAL ENDOSCOPIC ULTRASOUND (EUS);  Surgeon: Milus Banister, MD;  Location: [facility];  Service: Endoscopy;;  . VIDEO BRONCHOSCOPY WITH ENDOBRONCHIAL NAVIGATION N/A 12/31/2015   Procedure: VIDEO BRONCHOSCOPY WITH ENDOBRONCHIAL NAVIGATION;  Surgeon: Melrose Nakayama, MD;  Location: [facility];  Service: Thoracic;  Laterality: N/A;  . VIDEO BRONCHOSCOPY WITH ENDOBRONCHIAL ULTRASOUND N/A 11/08/2015   Procedure: VIDEO BRONCHOSCOPY WITH ENDOBRONCHIAL ULTRASOUND;  Surgeon: Ivin Poot, MD;  Location: [facility] OR;  Service: Thoracic;  Laterality: N/A;    REVIEW OF SYSTEMS:   Review of Systems  Constitutional: Negative for appetite change, chills, fatigue, fever and unexpected weight change.  HENT:   Negative for mouth sores, nosebleeds, sore throat and trouble swallowing.   Eyes: Negative for eye problems and icterus.   Respiratory: Negative for cough, hemoptysis, shortness of breath and wheezing.   Cardiovascular: Negative for chest pain and leg swelling.  Gastrointestinal: Negative for abdominal pain, constipation, diarrhea, nausea and vomiting.  Genitourinary: Negative for bladder incontinence, difficulty urinating, dysuria, frequency and hematuria.   Musculoskeletal: Negative for back pain, gait problem, neck pain and neck stiffness.  Skin: Negative for itching and rash.  Neurological: Negative for dizziness, extremity weakness, gait problem, light-headedness and seizures. Positive for headaches. Hematological: Negative for adenopathy. Does not bruise/bleed easily.  Psychiatric/Behavioral: Negative for confusion, depression and sleep disturbance. The patient is not nervous/anxious.     PHYSICAL EXAMINATION:  Blood pressure 124/78, pulse 90, temperature 98.6 F (37 C), temperature source Oral, resp. rate 19, height 5\' 7"  (1.702 m), weight 142 lb 11.2 oz (64.7 kg), SpO2 97 %.  ECOG PERFORMANCE STATUS: 1 - Symptomatic but completely ambulatory  Physical Exam  Constitutional: Oriented to person, place, and time and well-developed, well-nourished, and in no distress. No distress.  HENT:  Head: Normocephalic  and atraumatic.  Mouth/Throat: Oropharynx is clear and moist. No oropharyngeal exudate.  Eyes: Conjunctivae are normal. Right eye exhibits no discharge. Left eye exhibits no discharge. No scleral icterus.  Neck: Normal range of motion. Neck supple.  Cardiovascular: Normal rate, regular rhythm, normal heart sounds and intact distal pulses.   Pulmonary/Chest: Effort normal and breath sounds normal. No respiratory distress. No wheezes. No rales.  Abdominal: Soft. Bowel sounds are normal. Exhibits no distension and no mass. There is no tenderness.  Musculoskeletal: Normal range of motion. Exhibits no edema.  Lymphadenopathy:    No cervical adenopathy.  Neurological: Alert and oriented to person, place,  and time. Exhibits normal muscle tone. Gait normal. Coordination normal.  Skin: Skin is warm and dry. No rash noted. Not diaphoretic. No erythema. No pallor.  Psychiatric: Mood, memory and judgment normal.  Vitals reviewed.  LABORATORY DATA: Lab Results  Component Value Date   WBC 8.7 04/09/2017   HGB 14.3 04/09/2017   HCT 42.7 04/09/2017   MCV 86.8 04/09/2017   PLT 321 04/09/2017      Chemistry      Component Value Date/Time   NA 144 04/09/2017 0948   K 4.3 04/09/2017 0948   CL 102 04/29/2016 1032   CO2 25 04/09/2017 0948   BUN 16.8 04/09/2017 0948   CREATININE 0.8 04/09/2017 0948      Component Value Date/Time   CALCIUM 9.1 04/09/2017 0948   ALKPHOS 216 (H) 04/09/2017 0948   AST 12 04/09/2017 0948   ALT 13 04/09/2017 0948   BILITOT 0.22 04/09/2017 0948       RADIOGRAPHIC STUDIES:  Ct Chest W Contrast  Result Date: 04/20/2017 CLINICAL DATA:  Stage IIIA right middle lobe non-small cell lung cancer diagnosed September 2017 status post concurrent chemoradiation therapy. Patient presents for restaging. EXAM: CT CHEST WITH CONTRAST TECHNIQUE: Multidetector CT imaging of the chest was performed during intravenous contrast administration. CONTRAST:  27mL ISOVUE-300 IOPAMIDOL (ISOVUE-300) INJECTION 61% COMPARISON:  01/22/2017 chest CT. FINDINGS: Cardiovascular: Normal heart size. No significant pericardial fluid/thickening. Left anterior descending and right coronary atherosclerosis . Atherosclerotic nonaneurysmal thoracic aorta. Borderline enlarged main pulmonary artery (3.3 cm diameter), stable. No central pulmonary emboli. Mediastinum/Nodes: Stable subcentimeter hypodense left thyroid lobe nodule. Unremarkable esophagus. No pathologically enlarged axillary, mediastinal or hilar lymph nodes. Previously described mildly enlarged 1.1 cm subcarinal node measures 0.8 cm on today's scan, mildly decreased. Lungs/Pleura: No pneumothorax. No pleural effusion. Irregular 8 mm solid left upper  lobe pulmonary nodule (series 7/ image 57), previously 6 mm on 01/22/2017, increased. Previously described irregular 9 mm anterior inferior right lower lobe pulmonary nodule on 01/22/2017 chest CT has essentially resolved on today's scan. Additional previously described smaller pulmonary nodules in the right upper and right lower lobes are stable, largest 8 mm in the superior segment right lower lobe (series 7/ image 77). New small irregular pulmonary nodules measuring 6 mm in the posterior right upper lobe (series 7/ image 67) and 4 mm in the basilar right upper lobe (series 7/ image 100). Complete right middle lobe atelectasis. Stable thin parenchymal bands in the basilar lower lobes compatible with postinfectious/ postinflammatory scarring. No acute consolidative airspace disease or lung masses. Severe centrilobular emphysema. Upper abdomen: No acute abnormality. Subcentimeter hypodense anterior right renal cortical lesion, too small to characterize, stable, considered benign. Musculoskeletal: No aggressive appearing focal osseous lesions. Non expansile sclerotic lateral right seventh rib lesion is stable since 09/25/2015 and was non FDG avid on prior PET-CT studies, considered benign. Moderate thoracic  spondylosis. IMPRESSION: 1. Interval growth of irregular 8 mm solid left upper lobe pulmonary nodule, which correlates with the hypermetabolic left upper lobe pulmonary nodule described on the baseline 10/23/2015 PET-CT study, compatible with growing contralateral pulmonary metastasis. 2. Two new small irregular right upper lobe pulmonary nodules, largest 6 mm, which may represent new pulmonary metastases. 3. Additional previously described pulmonary nodules in the right lung are stable to decreased. 4. No thoracic adenopathy. Previously described mildly enlarged subcarinal node is mildly decreased. 5. Two-vessel coronary atherosclerosis. Aortic Atherosclerosis (ICD10-I70.0) and Emphysema (ICD10-J43.9).  Electronically Signed   By: Ilona Sorrel M.D.   On: 04/20/2017 09:44     ASSESSMENT/PLAN:  Adenocarcinoma of right lung, stage 3 (HCC) This is a very pleasant [name] with a stage IIIa non-small cell lung cancer status post concurrent chemoradiation followed by consolidation chemotherapy with carboplatin and paclitaxel for 3 cycles and currently on observation.  The patient was seen with Dr. Julien Nordmann.  Restaging CT scan result was discussed with the patient.  CT showed some mild increase in her left upper lobe pulmonary nodule and 2 small nodules on the right which could represent pulmonary metastases.  Recommend that she restart treatment with immunotherapy consisting of Nivolumab 480 milligrams IV every 4 weeks. Adverse effects of this treatment were discussed including but not limited to immune mediated the skin rash, diarrhea, inflammation of the lung, kidney, liver, thyroid or other endocrine dysfunction.  The patient is agreeable to proceeding.  Anticipate first dose on 04/30/2017.  The patient is having increased headaches.  Her last MRI of the brain was performed in October 2017 which was negative for brain metastases.  We will obtain an MRI of the brain within 1-2 weeks given that she is having persistent headaches to rule out brain metastases.  Follow-up visit will be in approximately 5 weeks for evaluation prior to cycle 2 of her Nivolumab.  The patient was advised to call immediately if she has any concerning symptoms in the interval. The patient voices understanding of current disease status and treatment options and is in agreement with the current care plan. All questions were answered. The patient knows to call the clinic with any problems, questions or concerns. We can certainly see the patient much sooner if necessary.  Orders Placed This Encounter  Procedures  . MR Brain W Wo Contrast    Standing Status:   Future    Standing Expiration Date:    04/23/2018    Order Specific Question:   If indicated for the ordered procedure, I authorize the administration of contrast media per Radiology protocol    Answer:   Yes    Order Specific Question:   What is the patient's sedation requirement?    Answer:   No Sedation    Order Specific Question:   Does the patient have a pacemaker or implanted devices?    Answer:   No    Order Specific Question:   Radiology Contrast Protocol - do NOT remove file path    Answer:   [email or web address]    Order Specific Question:   Reason for Exam additional comments    Answer:   Lung cancer. Pt with increased headaches and vision changes. R/O brain mets.    Order Specific Question:   Preferred imaging location?    Answer:   [facility] (table limit-350 lbs)  . CBC with Differential (Cancer Center Only)    Standing Status:   Standing    Number of  Occurrences:   12    Standing Expiration Date:   04/23/2018  . CMP ([facility] only)    Standing Status:   Standing    Number of Occurrences:   12    Standing Expiration Date:   04/23/2018  . TSH    Standing Status:   Standing    Number of Occurrences:   12    Standing Expiration Date:   04/23/2018    Mikey Bussing, DNP, AGPCNP-BC, AOCNP 04/23/17  ADDENDUM: Hematology/Oncology Attending: I had a face-to-face encounter with the patient.  I recommended her care plan.  This is a very pleasant 67 years old African-American female with now metastatic non-small cell lung cancer, favoring adenocarcinoma status post induction concurrent chemoradiation followed by consolidation chemotherapy with carboplatin and paclitaxel. The patient has been in observation for several months.  Her recent CT scan of the chest showed evidence for disease progression with increase in size of multiple pulmonary nodules. I had a lengthy discussion with the patient today about her current disease of stage, prognosis and treatment options. I discussed with  the option of treatment with systemic chemotherapy versus consideration of second line treatment with immunotherapy with Nivolumab 480 mg IV every 4 weeks.  The patient is interested in treatment with immunotherapy.  I discussed with her the adverse effect of this treatment including but not limited to immunotherapy mediated to skin rash, diarrhea, inflammation of the lung, kidney, liver, thyroid or other endocrine dysfunction. She is expected to start the first cycle of this treatment next week. The patient was advised to call immediately if she has any concerning symptoms in the interval.  Disclaimer: This note was dictated with voice recognition software. Similar sounding words can inadvertently be transcribed and may be missed upon review. Eilleen Kempf, MD 04/25/17

## 2017-04-28 ENCOUNTER — Telehealth: Payer: Self-pay | Admitting: Oncology

## 2017-04-28 NOTE — Telephone Encounter (Signed)
Added apt per 1/10 los - Patient is aware of 1/17 appt

## 2017-04-30 ENCOUNTER — Inpatient Hospital Stay: Payer: Medicare HMO

## 2017-04-30 VITALS — BP 140/88 | HR 82 | Temp 98.5°F | Resp 19 | Wt 146.5 lb

## 2017-04-30 DIAGNOSIS — C3491 Malignant neoplasm of unspecified part of right bronchus or lung: Secondary | ICD-10-CM

## 2017-04-30 DIAGNOSIS — Z5112 Encounter for antineoplastic immunotherapy: Secondary | ICD-10-CM | POA: Diagnosis not present

## 2017-04-30 LAB — CBC WITH DIFFERENTIAL (CANCER CENTER ONLY)
Basophils Absolute: 0 10*3/uL (ref 0.0–0.1)
Basophils Relative: 0 %
EOS PCT: 1 %
Eosinophils Absolute: 0 10*3/uL (ref 0.0–0.5)
HEMATOCRIT: 38.9 % (ref 34.8–46.6)
Hemoglobin: 13.2 g/dL (ref 11.6–15.9)
LYMPHS ABS: 1.5 10*3/uL (ref 0.9–3.3)
LYMPHS PCT: 32 %
MCH: 29.1 pg (ref 25.1–34.0)
MCHC: 33.9 g/dL (ref 31.5–36.0)
MCV: 85.7 fL (ref 79.5–101.0)
MONO ABS: 0.2 10*3/uL (ref 0.1–0.9)
MONOS PCT: 5 %
NEUTROS ABS: 2.9 10*3/uL (ref 1.5–6.5)
Neutrophils Relative %: 62 %
PLATELETS: 242 10*3/uL (ref 145–400)
RBC: 4.54 MIL/uL (ref 3.70–5.45)
RDW: 15.7 % (ref 11.2–16.1)
WBC Count: 4.7 10*3/uL (ref 3.9–10.3)

## 2017-04-30 LAB — CMP (CANCER CENTER ONLY)
ALT: 9 U/L (ref 0–55)
AST: 17 U/L (ref 5–34)
Albumin: 3.4 g/dL — ABNORMAL LOW (ref 3.5–5.0)
Alkaline Phosphatase: 213 U/L — ABNORMAL HIGH (ref 40–150)
Anion gap: 9 (ref 3–11)
BUN: 14 mg/dL (ref 7–26)
CHLORIDE: 109 mmol/L (ref 98–109)
CO2: 26 mmol/L (ref 22–29)
CREATININE: 0.7 mg/dL (ref 0.60–1.10)
Calcium: 9.1 mg/dL (ref 8.4–10.4)
Glucose, Bld: 79 mg/dL (ref 70–140)
POTASSIUM: 4 mmol/L (ref 3.3–4.7)
Sodium: 144 mmol/L (ref 136–145)
TOTAL PROTEIN: 6.6 g/dL (ref 6.4–8.3)

## 2017-04-30 LAB — RESEARCH LABS

## 2017-04-30 LAB — TSH: TSH: 1.63 u[IU]/mL (ref 0.308–3.960)

## 2017-04-30 MED ORDER — SODIUM CHLORIDE 0.9 % IV SOLN
Freq: Once | INTRAVENOUS | Status: AC
  Administered 2017-04-30: 10:00:00 via INTRAVENOUS

## 2017-04-30 MED ORDER — SODIUM CHLORIDE 0.9 % IV SOLN
480.0000 mg | Freq: Once | INTRAVENOUS | Status: AC
  Administered 2017-04-30: 480 mg via INTRAVENOUS
  Filled 2017-04-30: qty 48

## 2017-04-30 NOTE — Patient Instructions (Signed)
[facility] Discharge Instructions for Patients Receiving Chemotherapy  Today you received the following chemotherapy agents Nivolumab  To help prevent nausea and vomiting after your treatment, we encourage you to take your nausea medication as directed   If you develop nausea and vomiting that is not controlled by your nausea medication, call the clinic.   BELOW ARE SYMPTOMS THAT SHOULD BE REPORTED IMMEDIATELY:  *FEVER GREATER THAN 100.5 F  *CHILLS WITH OR WITHOUT FEVER  NAUSEA AND VOMITING THAT IS NOT CONTROLLED WITH YOUR NAUSEA MEDICATION  *UNUSUAL SHORTNESS OF BREATH  *UNUSUAL BRUISING OR BLEEDING  TENDERNESS IN MOUTH AND THROAT WITH OR WITHOUT PRESENCE OF ULCERS  *URINARY PROBLEMS  *BOWEL PROBLEMS  UNUSUAL RASH Items with * indicate a potential emergency and should be followed up as soon as possible.  Feel free to call the clinic should you have any questions or concerns. The clinic phone number is [phone].  Please show the [facility] at check-in to the Emergency Department and triage nurse.  Nivolumab injection What is this medicine? NIVOLUMAB (nye VOL ue mab) is a monoclonal antibody. It is used to treat melanoma, lung cancer, kidney cancer, head and neck cancer, Hodgkin lymphoma, urothelial cancer, colon cancer, and liver cancer. This medicine may be used for other purposes; ask your health care provider or pharmacist if you have questions. COMMON BRAND NAME(S): Opdivo What should I tell my health care provider before I take this medicine? They need to know if you have any of these conditions: -diabetes -immune system problems -kidney disease -liver disease -lung disease -organ transplant -stomach or intestine problems -thyroid disease -an unusual or allergic reaction to nivolumab, other medicines, foods, dyes, or preservatives -pregnant or trying to get pregnant -breast-feeding How should I use this medicine? This medicine is  for infusion into a vein. It is given by a health care professional in a hospital or clinic setting. A special MedGuide will be given to you before each treatment. Be sure to read this information carefully each time. Talk to your pediatrician regarding the use of this medicine in children. While this drug may be prescribed for children as young as 12 years for selected conditions, precautions do apply. Overdosage: If you think you have taken too much of this medicine contact a poison control center or emergency room at once. NOTE: This medicine is only for you. Do not share this medicine with others. What if I miss a dose? It is important not to miss your dose. Call your doctor or health care professional if you are unable to keep an appointment. What may interact with this medicine? Interactions have not been studied. Give your health care provider a list of all the medicines, herbs, non-prescription drugs, or dietary supplements you use. Also tell them if you smoke, drink alcohol, or use illegal drugs. Some items may interact with your medicine. This list may not describe all possible interactions. Give your health care provider a list of all the medicines, herbs, non-prescription drugs, or dietary supplements you use. Also tell them if you smoke, drink alcohol, or use illegal drugs. Some items may interact with your medicine. What should I watch for while using this medicine? This drug may make you feel generally unwell. Continue your course of treatment even though you feel ill unless your doctor tells you to stop. You may need blood work done while you are taking this medicine. Do not become pregnant while taking this medicine or for 5 months after stopping  it. Women should inform their doctor if they wish to become pregnant or think they might be pregnant. There is a potential for serious side effects to an unborn child. Talk to your health care professional or pharmacist for more information.  Do not breast-feed an infant while taking this medicine. What side effects may I notice from receiving this medicine? Side effects that you should report to your doctor or health care professional as soon as possible: -allergic reactions like skin rash, itching or hives, swelling of the face, lips, or tongue -black, tarry stools -blood in the urine -bloody or watery diarrhea -changes in vision -change in sex drive -changes in emotions or moods -chest pain -confusion -cough -decreased appetite -diarrhea -facial flushing -feeling faint or lightheaded -fever, chills -hair loss -hallucination, loss of contact with reality -headache -irritable -joint pain -loss of memory -muscle pain -muscle weakness -seizures -shortness of breath -signs and symptoms of high blood sugar such as dizziness; dry mouth; dry skin; fruity breath; nausea; stomach pain; increased hunger or thirst; increased urination -signs and symptoms of kidney injury like trouble passing urine or change in the amount of urine -signs and symptoms of liver injury like dark yellow or brown urine; general ill feeling or flu-like symptoms; light-colored stools; loss of appetite; nausea; right upper belly pain; unusually weak or tired; yellowing of the eyes or skin -stiff neck -swelling of the ankles, feet, hands -weight gain Side effects that usually do not require medical attention (report to your doctor or health care professional if they continue or are bothersome): -bone pain -constipation -tiredness -vomiting This list may not describe all possible side effects. Call your doctor for medical advice about side effects. You may report side effects to FDA at 1-800-FDA-1088. Where should I keep my medicine? This drug is given in a hospital or clinic and will not be stored at home. NOTE: This sheet is a summary. It may not cover all possible information. If you have questions about this medicine, talk to your doctor,  pharmacist, or health care provider.  2018 Elsevier/Gold Standard (2016-01-07 17:49:34)

## 2017-04-30 NOTE — Progress Notes (Signed)
Per Julien Nordmann it is okay to treat pt today with nivolumab and reported neuropathy. Pt needs to call for worsening symptoms interfering with ADLs.

## 2017-05-05 ENCOUNTER — Ambulatory Visit (HOSPITAL_COMMUNITY): Admission: RE | Admit: 2017-05-05 | Payer: Medicare HMO | Source: Ambulatory Visit

## 2017-05-11 ENCOUNTER — Ambulatory Visit (HOSPITAL_COMMUNITY)
Admission: RE | Admit: 2017-05-11 | Discharge: 2017-05-11 | Disposition: A | Discharge status: Home / Self Care | Payer: Medicare HMO | Source: Ambulatory Visit | Attending: Oncology | Admitting: Oncology

## 2017-05-11 DIAGNOSIS — I6782 Cerebral ischemia: Secondary | ICD-10-CM | POA: Insufficient documentation

## 2017-05-11 DIAGNOSIS — G9389 Other specified disorders of brain: Secondary | ICD-10-CM | POA: Diagnosis not present

## 2017-05-11 DIAGNOSIS — R51 Headache: Secondary | ICD-10-CM | POA: Insufficient documentation

## 2017-05-11 DIAGNOSIS — H539 Unspecified visual disturbance: Secondary | ICD-10-CM | POA: Diagnosis not present

## 2017-05-11 DIAGNOSIS — H748X1 Other specified disorders of right middle ear and mastoid: Secondary | ICD-10-CM | POA: Diagnosis not present

## 2017-05-11 DIAGNOSIS — C3491 Malignant neoplasm of unspecified part of right bronchus or lung: Secondary | ICD-10-CM | POA: Diagnosis not present

## 2017-05-11 MED ORDER — GADOBENATE DIMEGLUMINE 529 MG/ML IV SOLN
15.0000 mL | Freq: Once | INTRAVENOUS | Status: AC | PRN
  Administered 2017-05-11: 13 mL via INTRAVENOUS

## 2017-05-28 ENCOUNTER — Inpatient Hospital Stay: Payer: Medicare HMO

## 2017-05-28 ENCOUNTER — Inpatient Hospital Stay: Payer: Medicare HMO | Attending: Oncology | Admitting: Oncology

## 2017-05-28 ENCOUNTER — Encounter: Payer: Self-pay | Admitting: Oncology

## 2017-05-28 VITALS — BP 116/77 | HR 103 | Temp 98.9°F | Resp 22 | Ht 67.0 in | Wt 140.9 lb

## 2017-05-28 DIAGNOSIS — Z79899 Other long term (current) drug therapy: Secondary | ICD-10-CM | POA: Insufficient documentation

## 2017-05-28 DIAGNOSIS — C3491 Malignant neoplasm of unspecified part of right bronchus or lung: Secondary | ICD-10-CM

## 2017-05-28 DIAGNOSIS — R51 Headache: Secondary | ICD-10-CM | POA: Diagnosis not present

## 2017-05-28 DIAGNOSIS — R911 Solitary pulmonary nodule: Secondary | ICD-10-CM | POA: Diagnosis not present

## 2017-05-28 DIAGNOSIS — R05 Cough: Secondary | ICD-10-CM | POA: Diagnosis not present

## 2017-05-28 DIAGNOSIS — C342 Malignant neoplasm of middle lobe, bronchus or lung: Secondary | ICD-10-CM | POA: Insufficient documentation

## 2017-05-28 DIAGNOSIS — Z5112 Encounter for antineoplastic immunotherapy: Secondary | ICD-10-CM

## 2017-05-28 LAB — CMP (CANCER CENTER ONLY)
ALT: 13 U/L (ref 0–55)
ANION GAP: 10 (ref 3–11)
AST: 19 U/L (ref 5–34)
Albumin: 3.5 g/dL (ref 3.5–5.0)
Alkaline Phosphatase: 230 U/L — ABNORMAL HIGH (ref 40–150)
BUN: 24 mg/dL (ref 7–26)
CO2: 25 mmol/L (ref 22–29)
CREATININE: 0.69 mg/dL (ref 0.60–1.10)
Calcium: 9.9 mg/dL (ref 8.4–10.4)
Chloride: 107 mmol/L (ref 98–109)
Glucose, Bld: 85 mg/dL (ref 70–140)
Potassium: 4.4 mmol/L (ref 3.5–5.1)
SODIUM: 142 mmol/L (ref 136–145)
Total Bilirubin: 0.2 mg/dL (ref 0.2–1.2)
Total Protein: 7.4 g/dL (ref 6.4–8.3)

## 2017-05-28 LAB — CBC WITH DIFFERENTIAL (CANCER CENTER ONLY)
Basophils Absolute: 0 10*3/uL (ref 0.0–0.1)
Basophils Relative: 0 %
EOS ABS: 0.1 10*3/uL (ref 0.0–0.5)
EOS PCT: 1 %
HCT: 39.5 % (ref 34.8–46.6)
Hemoglobin: 13.7 g/dL (ref 11.6–15.9)
LYMPHS ABS: 1.2 10*3/uL (ref 0.9–3.3)
LYMPHS PCT: 15 %
MCH: 29.5 pg (ref 25.1–34.0)
MCHC: 34.7 g/dL (ref 31.5–36.0)
MCV: 84.9 fL (ref 79.5–101.0)
MONO ABS: 0.5 10*3/uL (ref 0.1–0.9)
MONOS PCT: 7 %
Neutro Abs: 6.3 10*3/uL (ref 1.5–6.5)
Neutrophils Relative %: 77 %
PLATELETS: 216 10*3/uL (ref 145–400)
RBC: 4.65 MIL/uL (ref 3.70–5.45)
RDW: 15.7 % — AB (ref 11.2–14.5)
WBC: 8.2 10*3/uL (ref 3.9–10.3)

## 2017-05-28 LAB — TSH: TSH: 1.66 u[IU]/mL (ref 0.308–3.960)

## 2017-05-28 MED ORDER — SODIUM CHLORIDE 0.9 % IV SOLN
Freq: Once | INTRAVENOUS | Status: AC
  Administered 2017-05-28: 12:00:00 via INTRAVENOUS

## 2017-05-28 MED ORDER — SODIUM CHLORIDE 0.9 % IV SOLN
480.0000 mg | Freq: Once | INTRAVENOUS | Status: AC
  Administered 2017-05-28: 480 mg via INTRAVENOUS
  Filled 2017-05-28: qty 48

## 2017-05-28 NOTE — Progress Notes (Signed)
[facility] OFFICE PROGRESS NOTE  Barry Dienes, NP [place] [address]  DIAGNOSIS: Stage IIIA (T1b, N2, M0) non-small cell lung cancer favoring adenocarcinoma presented with right middle lobe pulmonary nodule, mediastinal lymphadenopathy and highly suspicious for small nodule in the left upper lobe that could change her stage to stage IV that could present another synchronous primary lesion in the left upper lobe. This was diagnosed in September 2017.  PRIOR THERAPY: 1) Concurrent chemoradiation with weekly carboplatin for AUC of 2 and paclitaxel 45 MG/M2 status post 6 cycles last dose was given 02/25/2016 with partial response. 2) Consolidation chemotherapy with carboplatin for AUC of 5 and paclitaxel 175 MG/M2 every 3 weeks with Neulasta support. First dose 04/22/2016. Status post 3 cycles.  CURRENT THERAPY: Nivolumab 480 mg IV given every 4 weeks.  First dose 04/30/2017.  Status post 1 cycle.  INTERVAL HISTORY: Courtney Zuniga 67 y.o. female returns for routine follow-up visit by herself.  The patient is feeling fine today and has no specific complaints except for ongoing headaches and increased cough.  The patient reports her headaches have not changed since her last visit.  She denies any visual disturbances.  The patient reports an increased cough with occasional white sputum production.  She denies having any fevers or chills.  Denies chest pain, shortness of breath at rest, hemoptysis.  She does report dyspnea on exertion.  Denies nausea, vomiting, constipation, diarrhea.  She has not had any recent weight loss or night sweats.  She tolerated her first cycle of Nivolumab fairly well.  The patient is here for evaluation prior to cycle #2 of Nivolumab and to review her recent MRI of the brain.  MEDICAL HISTORY: Past Medical History:  Diagnosis Date  . Anemia    as a young woman  . Arthritis    osteoartritis  . Asthma   . Brain tumor (benign) ([facility]) 2005  [facility]   Benign  . Chronic headaches   . Chronic hip pain   . Chronic pain   . COPD (chronic obstructive pulmonary disease) ([facility])   . Coronary artery disease   . Depression   . Depression 05/15/2016  . Encounter for antineoplastic chemotherapy 01/10/2016  . GERD (gastroesophageal reflux disease)   . Hypertension   . Lung cancer ([facility]) dx'd 01/2016   radiation and chemo done, none now  . NSTEMI (non-ST elevated myocardial infarction) ([facility]) yrs ago  . On home O2    qhs 2 liters at hs and prn  . Pneumonia last time 2 yrs ago  . Shortness of breath dyspnea    with activity    ALLERGIES:  has No Known Allergies.  MEDICATIONS:  Current Outpatient Medications  Medication Sig Dispense Refill  . albuterol (PROVENTIL HFA;VENTOLIN HFA) 108 (90 BASE) MCG/ACT inhaler Inhale 2 puffs into the lungs every 4 (four) hours as needed for shortness of breath.     Marland Kitchen albuterol (PROVENTIL) (2.5 MG/3ML) 0.083% nebulizer solution Take 2.5 mg by nebulization every 6 (six) hours as needed for wheezing or shortness of breath.    . Aspirin-Salicylamide-Caffeine (BC HEADACHE POWDER PO) Take 1 packet by mouth 2 (two) times daily as needed (headache).     Marland Kitchen BREO ELLIPTA 100-25 MCG/INH AEPB Inhale 1 puff into the lungs daily as needed.    . Calcium Carb-Cholecalciferol (CALCIUM/VITAMIN D PO) Take 1 tablet by mouth daily.    . cyclobenzaprine (FLEXERIL) 10 MG tablet Take 10 mg by mouth 3 (three) times daily.     Marland Kitchen  Fluticasone-Salmeterol (ADVAIR) 500-50 MCG/DOSE AEPB Inhale 1 puff into the lungs daily.     Marland Kitchen guaifenesin (ROBITUSSIN) 100 MG/5ML syrup Take 200 mg by mouth daily as needed for cough.    . meloxicam (MOBIC) 15 MG tablet Take 15 mg by mouth daily.    . metoprolol tartrate (LOPRESSOR) 25 MG tablet Take 1 tablet by mouth 2 (two) times daily.    Marland Kitchen omeprazole (PRILOSEC) 20 MG capsule Take 20 mg by mouth daily.    Marland Kitchen OVER THE COUNTER MEDICATION Take 1 packet by mouth at bedtime as needed (sleep). BC powder pm     . prochlorperazine (COMPAZINE) 10 MG tablet TAKE 1 TABLET BY MOUTH EVERY 6 HOURS AS NEEDED FOR NAUSEA & VOMITING 30 tablet 0  . Pseudoeph-Doxylamine-DM-APAP (NYQUIL PO) Take 1 Dose by mouth at bedtime as needed (cough).    . sucralfate (CARAFATE) 1 g tablet Take 1 tablet (1 g total) by mouth 4 (four) times daily. (Patient taking differently: Take 1 g by mouth 2 (two) times daily. ) 12 tablet 0  . mirtazapine (REMERON) 15 MG tablet Take 1 tablet (15 mg total) by mouth at bedtime. (Patient not taking: Reported on 05/28/2017) 30 tablet 0   No current facility-administered medications for this visit.     SURGICAL HISTORY:  Past Surgical History:  Procedure Laterality Date  . CHOLECYSTECTOMY    . COLONOSCOPY  2015   Results requested from [facility]  . COLONOSCOPY    . ESOPHAGOGASTRODUODENOSCOPY N/A 08/14/2015   Procedure: ESOPHAGOGASTRODUODENOSCOPY (EGD);  Surgeon: Daneil Dolin, MD;  Location: [facility];  Service: Endoscopy;  Laterality: N/A;  215   . ESOPHAGOGASTRODUODENOSCOPY (EGD) WITH PROPOFOL N/A 09/13/2015   Procedure: ESOPHAGOGASTRODUODENOSCOPY (EGD) WITH PROPOFOL;  Surgeon: Milus Banister, MD;  Location: [facility];  Service: Endoscopy;  Laterality: N/A;  . EUS N/A 03/12/2017   Procedure: UPPER ENDOSCOPIC ULTRASOUND (EUS) RADIAL;  Surgeon: Milus Banister, MD;  Location: [facility];  Service: Endoscopy;  Laterality: N/A;  . TUMOR REMOVAL  2005   Benign  . UPPER ESOPHAGEAL ENDOSCOPIC ULTRASOUND (EUS)  09/13/2015   Procedure: UPPER ESOPHAGEAL ENDOSCOPIC ULTRASOUND (EUS);  Surgeon: Milus Banister, MD;  Location: [facility];  Service: Endoscopy;;  . VIDEO BRONCHOSCOPY WITH ENDOBRONCHIAL NAVIGATION N/A 12/31/2015   Procedure: VIDEO BRONCHOSCOPY WITH ENDOBRONCHIAL NAVIGATION;  Surgeon: Melrose Nakayama, MD;  Location: [facility];  Service: Thoracic;  Laterality: N/A;  . VIDEO BRONCHOSCOPY WITH ENDOBRONCHIAL ULTRASOUND N/A 11/08/2015   Procedure: VIDEO BRONCHOSCOPY WITH  ENDOBRONCHIAL ULTRASOUND;  Surgeon: Ivin Poot, MD;  Location: [facility];  Service: Thoracic;  Laterality: N/A;    REVIEW OF SYSTEMS:   Review of Systems  Constitutional: Negative for appetite change, chills, fatigue, fever and unexpected weight change.  HENT:   Negative for mouth sores, nosebleeds, sore throat and trouble swallowing.   Eyes: Negative for eye problems and icterus.  Respiratory: Negative for hemoptysis, shortness of breath at rest and wheezing.  Positive for cough and shortness of breath with exertion.  Cardiovascular: Negative for chest pain and leg swelling.  Gastrointestinal: Negative for abdominal pain, constipation, diarrhea, nausea and vomiting.  Genitourinary: Negative for bladder incontinence, difficulty urinating, dysuria, frequency and hematuria.   Musculoskeletal: Negative for back pain, gait problem, neck pain and neck stiffness.  Skin: Negative for itching and rash.  Neurological: Negative for dizziness, extremity weakness, gait problem, light-headedness and seizures. Positive for headaches. Hematological: Negative for adenopathy. Does not bruise/bleed easily.  Psychiatric/Behavioral: Negative for confusion, depression  and sleep disturbance. The patient is not nervous/anxious.     PHYSICAL EXAMINATION:  Blood pressure 116/77, pulse (!) 103, temperature 98.9 F (37.2 C), temperature source Oral, resp. rate (!) 22, height 5\' 7"  (1.702 m), weight 140 lb 14.4 oz (63.9 kg), SpO2 94 %.  ECOG PERFORMANCE STATUS: 1 - Symptomatic but completely ambulatory  Physical Exam  Constitutional: Oriented to person, place, and time and well-developed, well-nourished, and in no distress. No distress.  HENT:  Head: Normocephalic and atraumatic.  Mouth/Throat: Oropharynx is clear and moist. No oropharyngeal exudate.  Eyes: Conjunctivae are normal. Right eye exhibits no discharge. Left eye exhibits no discharge. No scleral icterus.  Neck: Normal range of motion. Neck supple.   Cardiovascular: Normal rate, regular rhythm, normal heart sounds and intact distal pulses.   Pulmonary/Chest: Effort normal and breath sounds normal. No respiratory distress. No wheezes. No rales.  Abdominal: Soft. Bowel sounds are normal. Exhibits no distension and no mass. There is no tenderness.  Musculoskeletal: Normal range of motion. Exhibits no edema.  Lymphadenopathy:    No cervical adenopathy.  Neurological: Alert and oriented to person, place, and time. Exhibits normal muscle tone. Gait normal. Coordination normal.  Skin: Skin is warm and dry. No rash noted. Not diaphoretic. No erythema. No pallor.  Psychiatric: Mood, memory and judgment normal.  Vitals reviewed.  LABORATORY DATA: Lab Results  Component Value Date   WBC 8.2 05/28/2017   HGB 14.3 04/09/2017   HCT 39.5 05/28/2017   MCV 84.9 05/28/2017   PLT 216 05/28/2017      Chemistry      Component Value Date/Time   NA 142 05/28/2017 0915   NA 144 04/09/2017 0948   K 4.4 05/28/2017 0915   K 4.3 04/09/2017 0948   CL 107 05/28/2017 0915   CO2 25 05/28/2017 0915   CO2 25 04/09/2017 0948   BUN 24 05/28/2017 0915   BUN 16.8 04/09/2017 0948   CREATININE 0.69 05/28/2017 0915   CREATININE 0.8 04/09/2017 0948      Component Value Date/Time   CALCIUM 9.9 05/28/2017 0915   CALCIUM 9.1 04/09/2017 0948   ALKPHOS 230 (H) 05/28/2017 0915   ALKPHOS 216 (H) 04/09/2017 0948   AST 19 05/28/2017 0915   AST 12 04/09/2017 0948   ALT 13 05/28/2017 0915   ALT 13 04/09/2017 0948   BILITOT 0.2 05/28/2017 0915   BILITOT 0.22 04/09/2017 0948       RADIOGRAPHIC STUDIES:  Mr Jeri Cos YT Contrast  Result Date: 05/11/2017 CLINICAL DATA:  Lung cancer patient. Worsening headaches and visual disturbance. EXAM: MRI HEAD WITHOUT AND WITH CONTRAST TECHNIQUE: Multiplanar, multiecho pulse sequences of the brain and surrounding structures were obtained without and with intravenous contrast. CONTRAST:  82mL MULTIHANCE GADOBENATE DIMEGLUMINE  529 MG/ML IV SOLN COMPARISON:  01/25/2016 FINDINGS: Brain: Diffusion imaging does not show any acute or subacute infarction. The brainstem and cerebellum are normal. There are moderate chronic appearing small vessel ischemic changes affecting the cerebral hemispheric deep and subcortical white matter. No cortical or large vessel territory infarction. No sign of primary or metastatic mass lesion, hemorrhage, hydrocephalus or extra-axial collection. Small-vessel changes are somewhat progressive since 2017. Vascular: Major vessels at the base of the brain show flow. Skull and upper cervical spine: Changes of fibrous dysplasia of the calvarium again demonstrated in the frontal regions. No sign of bony metastatic disease. Sinuses/Orbits: Sinuses are clear. There is a mastoid effusion on the right. Chronic cystic changes in the torus tubarius regions. Other:  None IMPRESSION: No evidence of metastatic disease. No cause headaches and visual disturbance identified. Moderate chronic small-vessel ischemic changes of the cerebral hemispheric white matter, progressive over time. 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 Chronic right mastoid effusion with cystic areas in both torus tubarius regions. Electronically Signed   By: Nelson Chimes M.D.   On: 05/11/2017 12:25     ASSESSMENT/PLAN:  Adenocarcinoma of right lung, stage 3 (HCC) This is a very pleasant [name] with a stage IIIa non-small cell lung cancer status post concurrent chemoradiation followed by consolidation chemotherapy with carboplatin and paclitaxel for 3 cycles and was then placed on observation for 11 months. Restaging CT scan showed some mild increase in her left upper lobe pulmonary nodule and 2 small nodules on the right which could represent pulmonary metastases.  She is now on treatment with Nivolumab 480 milligrams IV every 4 weeks.  She tolerated her first cycle fairly well. She had a recent MRI of  the brain to evaluate for right brain metastases due to her ongoing headaches.  The patient was seen with Dr. Julien Nordmann.  MRI of the brain was discussed with the patient which showed no evidence of metastatic disease or any cause for her headaches. Recommend that she continue her treatment with Nivolumab.  She will proceed with cycle 2 as scheduled today.  The patient will return in 4 weeks for evaluation prior to cycle #3.  For her cough, recommend that she use over-the-counter medications such as Delsym.  She also has not been using her Advair Diskus on a regular basis.  I have encouraged her to start using this on a regular basis and to use her albuterol as needed.  The patient was advised to call immediately if she has any concerning symptoms in the interval. The patient voices understanding of current disease status and treatment options and is in agreement with the current care plan. All questions were answered. The patient knows to call the clinic with any problems, questions or concerns. We can certainly see the patient much sooner if necessary.  No orders of the defined types were placed in this encounter.   Mikey Bussing, Courtney Zuniga, Courtney Zuniga, Courtney Zuniga 05/28/17  ADDENDUM: Hematology/Oncology Attending: I had a face-to-face encounter with the patient today.  I recommended her care plan.  This is a very pleasant 67 years old African-American female with recurrent non-small cell lung cancer that was initially diagnosed as a stage IIIa status post a course of concurrent chemoradiation followed by consolidation systemic chemotherapy with carboplatin and paclitaxel. The patient is currently undergoing second line treatment with immunotherapy was Nivolumab status post 1 cycle.  She is tolerating this treatment fairly well with no concerning complaints.  MRI of the brain performed recently showed no evidence of metastatic disease to the brain. I recommended for the patient to proceed with cycle #2  today as a schedule. I will see her back for follow-up visit in 4 weeks for reevaluation before starting cycle #3.  The patient was advised to call immediately if she has any concerning symptoms in the interval.  Disclaimer: This note was dictated with voice recognition software. Similar sounding words can inadvertently be transcribed and may be missed upon review. Eilleen Kempf, MD 05/28/17

## 2017-05-28 NOTE — Patient Instructions (Signed)
[facility] Discharge Instructions for Patients Receiving Chemotherapy  Today you received the following chemotherapy agents: Nivolumab  To help prevent nausea and vomiting after your treatment, we encourage you to take your nausea medication as directed.    If you develop nausea and vomiting that is not controlled by your nausea medication, call the clinic.   BELOW ARE SYMPTOMS THAT SHOULD BE REPORTED IMMEDIATELY:  *FEVER GREATER THAN 100.5 F  *CHILLS WITH OR WITHOUT FEVER  NAUSEA AND VOMITING THAT IS NOT CONTROLLED WITH YOUR NAUSEA MEDICATION  *UNUSUAL SHORTNESS OF BREATH  *UNUSUAL BRUISING OR BLEEDING  TENDERNESS IN MOUTH AND THROAT WITH OR WITHOUT PRESENCE OF ULCERS  *URINARY PROBLEMS  *BOWEL PROBLEMS  UNUSUAL RASH Items with * indicate a potential emergency and should be followed up as soon as possible.  Feel free to call the clinic should you have any questions or concerns. The clinic phone number is [phone].  Please show the CHEMO ALERT CARD at check-in to the Emergency Department and triage nurse.   

## 2017-05-28 NOTE — Assessment & Plan Note (Signed)
This is a very pleasant [name] with a stage IIIa non-small cell lung cancer status post concurrent chemoradiation followed by consolidation chemotherapy with carboplatin and paclitaxel for 3 cycles and was then placed on observation for 11 months. Restaging CT scan showed some mild increase in her left upper lobe pulmonary nodule and 2 small nodules on the right which could represent pulmonary metastases.  She is now on treatment with Nivolumab 480 milligrams IV every 4 weeks.  She tolerated her first cycle fairly well. She had a recent MRI of the brain to evaluate for right brain metastases due to her ongoing headaches.  The patient was seen with Dr. Julien Nordmann.  MRI of the brain was discussed with the patient which showed no evidence of metastatic disease or any cause for her headaches. Recommend that she continue her treatment with Nivolumab.  She will proceed with cycle 2 as scheduled today.  The patient will return in 4 weeks for evaluation prior to cycle #3.  For her cough, recommend that she use over-the-counter medications such as Delsym.  She also has not been using her Advair Diskus on a regular basis.  I have encouraged her to start using this on a regular basis and to use her albuterol as needed.  The patient was advised to call immediately if she has any concerning symptoms in the interval. The patient voices understanding of current disease status and treatment options and is in agreement with the current care plan. All questions were answered. The patient knows to call the clinic with any problems, questions or concerns. We can certainly see the patient much sooner if necessary.

## 2017-05-29 ENCOUNTER — Telehealth: Payer: Self-pay | Admitting: Oncology

## 2017-05-29 NOTE — Telephone Encounter (Signed)
Scheduled appt per 2/14 los - patient to get an updated schedule next visit.

## 2017-06-25 ENCOUNTER — Inpatient Hospital Stay: Payer: Medicare HMO | Attending: Oncology | Admitting: Internal Medicine

## 2017-06-25 ENCOUNTER — Inpatient Hospital Stay: Payer: Medicare HMO

## 2017-06-25 ENCOUNTER — Encounter: Payer: Self-pay | Admitting: Internal Medicine

## 2017-06-25 VITALS — BP 128/93 | HR 118 | Resp 24 | Wt 140.9 lb

## 2017-06-25 DIAGNOSIS — R0609 Other forms of dyspnea: Secondary | ICD-10-CM

## 2017-06-25 DIAGNOSIS — R51 Headache: Secondary | ICD-10-CM | POA: Diagnosis not present

## 2017-06-25 DIAGNOSIS — Z5112 Encounter for antineoplastic immunotherapy: Secondary | ICD-10-CM

## 2017-06-25 DIAGNOSIS — C3491 Malignant neoplasm of unspecified part of right bronchus or lung: Secondary | ICD-10-CM

## 2017-06-25 DIAGNOSIS — C342 Malignant neoplasm of middle lobe, bronchus or lung: Secondary | ICD-10-CM | POA: Diagnosis not present

## 2017-06-25 DIAGNOSIS — Z79899 Other long term (current) drug therapy: Secondary | ICD-10-CM | POA: Diagnosis not present

## 2017-06-25 DIAGNOSIS — C3490 Malignant neoplasm of unspecified part of unspecified bronchus or lung: Secondary | ICD-10-CM

## 2017-06-25 LAB — CMP (CANCER CENTER ONLY)
ALK PHOS: 226 U/L — AB (ref 40–150)
ALT: 17 U/L (ref 0–55)
ANION GAP: 9 (ref 3–11)
AST: 17 U/L (ref 5–34)
Albumin: 3.6 g/dL (ref 3.5–5.0)
BILIRUBIN TOTAL: 0.3 mg/dL (ref 0.2–1.2)
BUN: 16 mg/dL (ref 7–26)
CALCIUM: 9.5 mg/dL (ref 8.4–10.4)
CO2: 25 mmol/L (ref 22–29)
CREATININE: 0.67 mg/dL (ref 0.60–1.10)
Chloride: 108 mmol/L (ref 98–109)
GFR, Est AFR Am: >60 mL/min (ref >60)
GFR, Estimated: >60 mL/min (ref >60)
Glucose, Bld: 88 mg/dL (ref 70–140)
Potassium: 3.9 mmol/L (ref 3.5–5.1)
SODIUM: 142 mmol/L (ref 136–145)
TOTAL PROTEIN: 7.2 g/dL (ref 6.4–8.3)

## 2017-06-25 LAB — CBC WITH DIFFERENTIAL (CANCER CENTER ONLY)
Basophils Absolute: 0 10*3/uL (ref 0.0–0.1)
Basophils Relative: 0 %
Eosinophils Absolute: 0.1 10*3/uL (ref 0.0–0.5)
Eosinophils Relative: 1 %
HEMATOCRIT: 39.7 % (ref 34.8–46.6)
HEMOGLOBIN: 13.7 g/dL (ref 11.6–15.9)
LYMPHS ABS: 1.7 10*3/uL (ref 0.9–3.3)
LYMPHS PCT: 17 %
MCH: 29.2 pg (ref 25.1–34.0)
MCHC: 34.4 g/dL (ref 31.5–36.0)
MCV: 84.7 fL (ref 79.5–101.0)
Monocytes Absolute: 0.8 10*3/uL (ref 0.1–0.9)
Monocytes Relative: 8 %
NEUTROS ABS: 7.5 10*3/uL — AB (ref 1.5–6.5)
NEUTROS PCT: 74 %
Platelet Count: 266 10*3/uL (ref 145–400)
RBC: 4.69 MIL/uL (ref 3.70–5.45)
RDW: 15.6 % — ABNORMAL HIGH (ref 11.2–14.5)
WBC: 10.1 10*3/uL (ref 3.9–10.3)

## 2017-06-25 LAB — TSH: TSH: <0.08 u[IU]/mL — ABNORMAL LOW (ref 0.308–3.960)

## 2017-06-25 MED ORDER — SODIUM CHLORIDE 0.9 % IV SOLN
480.0000 mg | Freq: Once | INTRAVENOUS | Status: AC
  Administered 2017-06-25: 480 mg via INTRAVENOUS
  Filled 2017-06-25: qty 48

## 2017-06-25 MED ORDER — SODIUM CHLORIDE 0.9 % IV SOLN
Freq: Once | INTRAVENOUS | Status: AC
  Administered 2017-06-25: 11:00:00 via INTRAVENOUS

## 2017-06-25 NOTE — Patient Instructions (Signed)
[facility] Discharge Instructions for Patients Receiving Chemotherapy  Today you received the following chemotherapy agents: Nivolumab (Opdivo)  To help prevent nausea and vomiting after your treatment, we encourage you to take your nausea medication as prescribed.    If you develop nausea and vomiting that is not controlled by your nausea medication, call the clinic.   BELOW ARE SYMPTOMS THAT SHOULD BE REPORTED IMMEDIATELY:  *FEVER GREATER THAN 100.5 F  *CHILLS WITH OR WITHOUT FEVER  NAUSEA AND VOMITING THAT IS NOT CONTROLLED WITH YOUR NAUSEA MEDICATION  *UNUSUAL SHORTNESS OF BREATH  *UNUSUAL BRUISING OR BLEEDING  TENDERNESS IN MOUTH AND THROAT WITH OR WITHOUT PRESENCE OF ULCERS  *URINARY PROBLEMS  *BOWEL PROBLEMS  UNUSUAL RASH Items with * indicate a potential emergency and should be followed up as soon as possible.  Feel free to call the clinic should you have any questions or concerns. The clinic phone number is [phone].  Please show the CHEMO ALERT CARD at check-in to the Emergency Department and triage nurse.   

## 2017-06-25 NOTE — Progress Notes (Signed)
[place] [phone]   [phone]  OFFICE PROGRESS NOTE  Barry Dienes, NP [place] [address]  DIAGNOSIS: Stage IIIA (T1b, N2, M0) non-small cell lung cancer presented with right middle lobe pulmonary nodule, mediastinal lymphadenopathy and highly suspicious for small nodule in the left upper lobe that could change her stage to stage IV that could present another synchronous primary lesion in the left upper lobe. This was diagnosed in September 2017.  PRIOR THERAPY:  1) Concurrent chemoradiation with weekly carboplatin for AUC of 2 and paclitaxel 45 MG/M2 status post 6 cycles last dose was given 02/25/2016 with partial response. 2) Consolidation chemotherapy with carboplatin for AUC of 5 and paclitaxel 175 MG/M2 every 3 weeks with Neulasta support. First dose 04/22/2016. Status post 3 cycles.  CURRENT THERAPY: Second line treatment with immunotherapy with Nivolumab 480 mg IV every 4 weeks status post 2 cycles..  INTERVAL HISTORY: Courtney Zuniga 67 y.o. female returns to the clinic today for follow-up visit accompanied by her sister.  The patient is feeling fine today with no specific complaints except for the baseline shortness of breath increased with exertion as well as intermittent headache.  She denied having any chest pain cough or hemoptysis.  She denied having any fever or chills.  She has no skin rash.  She denied having any nausea, vomiting, diarrhea or constipation.  She continues to tolerate her treatment with Nivolumab fairly well.  She is here today for evaluation before starting cycle #3.   MEDICAL HISTORY: Past Medical History:  Diagnosis Date  . Anemia    as a young woman  . Arthritis    osteoartritis  . Asthma   . Brain tumor (benign) ([place]) 2005 [facility]   Benign  . Chronic headaches   . Chronic hip pain   . Chronic pain   . COPD (chronic obstructive pulmonary disease) ([place])   . Coronary artery disease   .  Depression   . Depression 05/15/2016  . Encounter for antineoplastic chemotherapy 01/10/2016  . GERD (gastroesophageal reflux disease)   . Hypertension   . Lung cancer ([place]) dx'd 01/2016   radiation and chemo done, none now  . NSTEMI (non-ST elevated myocardial infarction) ([place]) yrs ago  . On home O2    qhs 2 liters at hs and prn  . Pneumonia last time 2 yrs ago  . Shortness of breath dyspnea    with activity    ALLERGIES:  has No Known Allergies.  MEDICATIONS:  Current Outpatient Medications  Medication Sig Dispense Refill  . albuterol (PROVENTIL HFA;VENTOLIN HFA) 108 (90 BASE) MCG/ACT inhaler Inhale 2 puffs into the lungs every 4 (four) hours as needed for shortness of breath.     Marland Kitchen albuterol (PROVENTIL) (2.5 MG/3ML) 0.083% nebulizer solution Take 2.5 mg by nebulization every 6 (six) hours as needed for wheezing or shortness of breath.    . Aspirin-Salicylamide-Caffeine (BC HEADACHE POWDER PO) Take 1 packet by mouth 2 (two) times daily as needed (headache).     Marland Kitchen BREO ELLIPTA 100-25 MCG/INH AEPB Inhale 1 puff into the lungs daily as needed.    . Calcium Carb-Cholecalciferol (CALCIUM/VITAMIN D PO) Take 1 tablet by mouth daily.    . cyclobenzaprine (FLEXERIL) 10 MG tablet Take 10 mg by mouth 3 (three) times daily.     . Fluticasone-Salmeterol (ADVAIR) 500-50 MCG/DOSE AEPB Inhale 1 puff into the lungs daily.     Marland Kitchen guaifenesin (ROBITUSSIN) 100 MG/5ML syrup Take 200  mg by mouth daily as needed for cough.    . meloxicam (MOBIC) 15 MG tablet Take 15 mg by mouth daily.    . metoprolol tartrate (LOPRESSOR) 25 MG tablet Take 1 tablet by mouth 2 (two) times daily.    . mirtazapine (REMERON) 15 MG tablet Take 1 tablet (15 mg total) by mouth at bedtime. (Patient not taking: Reported on 05/28/2017) 30 tablet 0  . omeprazole (PRILOSEC) 20 MG capsule Take 20 mg by mouth daily.    Marland Kitchen OVER THE COUNTER MEDICATION Take 1 packet by mouth at bedtime as needed (sleep). BC powder pm    . prochlorperazine  (COMPAZINE) 10 MG tablet TAKE 1 TABLET BY MOUTH EVERY 6 HOURS AS NEEDED FOR NAUSEA & VOMITING 30 tablet 0  . Pseudoeph-Doxylamine-DM-APAP (NYQUIL PO) Take 1 Dose by mouth at bedtime as needed (cough).    . sucralfate (CARAFATE) 1 g tablet Take 1 tablet (1 g total) by mouth 4 (four) times daily. (Patient taking differently: Take 1 g by mouth 2 (two) times daily. ) 12 tablet 0   No current facility-administered medications for this visit.     SURGICAL HISTORY:  Past Surgical History:  Procedure Laterality Date  . CHOLECYSTECTOMY    . COLONOSCOPY  2015   Results requested from [facility]  . COLONOSCOPY    . ESOPHAGOGASTRODUODENOSCOPY N/A 08/14/2015   Procedure: ESOPHAGOGASTRODUODENOSCOPY (EGD);  Surgeon: Daneil Dolin, MD;  [facility];  Service: Endoscopy;  Laterality: N/A;  215   . ESOPHAGOGASTRODUODENOSCOPY (EGD) WITH PROPOFOL N/A 09/13/2015   Procedure: ESOPHAGOGASTRODUODENOSCOPY (EGD) WITH PROPOFOL;  Surgeon: Milus Banister, MD;  [facility];  Service: Endoscopy;  Laterality: N/A;  . EUS N/A 03/12/2017   Procedure: UPPER ENDOSCOPIC ULTRASOUND (EUS) RADIAL;  Surgeon: Milus Banister, MD;  [facility];  Service: Endoscopy;  Laterality: N/A;  . TUMOR REMOVAL  2005   Benign  . UPPER ESOPHAGEAL ENDOSCOPIC ULTRASOUND (EUS)  09/13/2015   Procedure: UPPER ESOPHAGEAL ENDOSCOPIC ULTRASOUND (EUS);  Surgeon: Milus Banister, MD;  Location: [facility];  Service: Endoscopy;;  . VIDEO BRONCHOSCOPY WITH ENDOBRONCHIAL NAVIGATION N/A 12/31/2015   Procedure: VIDEO BRONCHOSCOPY WITH ENDOBRONCHIAL NAVIGATION;  Surgeon: Melrose Nakayama, MD;  Location: [place];  Service: Thoracic;  Laterality: N/A;  . VIDEO BRONCHOSCOPY WITH ENDOBRONCHIAL ULTRASOUND N/A 11/08/2015   Procedure: VIDEO BRONCHOSCOPY WITH ENDOBRONCHIAL ULTRASOUND;  Surgeon: Ivin Poot, MD;  [facility];  Service: Thoracic;  Laterality: N/A;    REVIEW OF SYSTEMS:  A comprehensive review of  systems was negative except for: Respiratory: positive for dyspnea on exertion Neurological: positive for headaches   PHYSICAL EXAMINATION: General appearance: alert, cooperative and no distress Head: Normocephalic, without obvious abnormality, atraumatic Neck: no adenopathy, no JVD, supple, symmetrical, trachea midline and thyroid not enlarged, symmetric, no tenderness/mass/nodules Lymph nodes: Cervical, supraclavicular, and axillary nodes normal. Resp: clear to auscultation bilaterally Back: symmetric, no curvature. ROM normal. No CVA tenderness. Cardio: regular rate and rhythm, S1, S2 normal, no murmur, click, rub or gallop GI: soft, non-tender; bowel sounds normal; no masses,  no organomegaly Extremities: extremities normal, atraumatic, no cyanosis or edema  ECOG PERFORMANCE STATUS: 1 - Symptomatic but completely ambulatory  Blood pressure (!) 128/93, pulse (!) 118, resp. rate (!) 24, weight 140 lb 14.4 oz (63.9 kg), SpO2 94 %.  LABORATORY DATA: Lab Results  Component Value Date   WBC 8.2 05/28/2017   HGB 14.3 04/09/2017   HCT 39.5 05/28/2017   MCV 84.9 05/28/2017  PLT 216 05/28/2017      Chemistry      Component Value Date/Time   NA 142 05/28/2017 0915   NA 144 04/09/2017 0948   K 4.4 05/28/2017 0915   K 4.3 04/09/2017 0948   CL 107 05/28/2017 0915   CO2 25 05/28/2017 0915   CO2 25 04/09/2017 0948   BUN 24 05/28/2017 0915   BUN 16.8 04/09/2017 0948   CREATININE 0.69 05/28/2017 0915   CREATININE 0.8 04/09/2017 0948      Component Value Date/Time   CALCIUM 9.9 05/28/2017 0915   CALCIUM 9.1 04/09/2017 0948   ALKPHOS 230 (H) 05/28/2017 0915   ALKPHOS 216 (H) 04/09/2017 0948   AST 19 05/28/2017 0915   AST 12 04/09/2017 0948   ALT 13 05/28/2017 0915   ALT 13 04/09/2017 0948   BILITOT 0.2 05/28/2017 0915   BILITOT 0.22 04/09/2017 0948       RADIOGRAPHIC STUDIES: No results found.  ASSESSMENT AND PLAN:  This is a very pleasant 67 years old African-American  female with a stage IIIa non-small cell lung cancer status post concurrent chemoradiation followed by consolidation chemotherapy with carboplatin and paclitaxel for 3 cycles. The patient was followed by observation but restaging imaging studies showed evidence for disease progression. She was a started on second line treatment with immunotherapy with Nivolumab 480 mg IV every 4 weeks status post 2 cycles.  She has been tolerating this treatment fairly well with no concerning complaints. I recommended for her to proceed with cycle #3 today as a scheduled. She will come back for follow-up visit in 4 weeks for evaluation after repeating CT scan of the chest for restaging of her disease. The patient was advised to call immediately if she has any concerning symptoms in the interval. The patient voices understanding of current disease status and treatment options and is in agreement with the current care plan. All questions were answered. The patient knows to call the clinic with any problems, questions or concerns. We can certainly see the patient much sooner if necessary.  Disclaimer: This note was dictated with voice recognition software. Similar sounding words can inadvertently be transcribed and may not be corrected upon review.

## 2017-07-02 ENCOUNTER — Other Ambulatory Visit (HOSPITAL_COMMUNITY): Payer: Self-pay | Admitting: Nurse Practitioner

## 2017-07-02 DIAGNOSIS — R748 Abnormal levels of other serum enzymes: Secondary | ICD-10-CM

## 2017-07-06 ENCOUNTER — Ambulatory Visit (HOSPITAL_COMMUNITY)
Admission: RE | Admit: 2017-07-06 | Discharge: 2017-07-06 | Disposition: A | Discharge status: Home / Self Care | Payer: Medicare HMO | Source: Ambulatory Visit | Attending: Nurse Practitioner | Admitting: Nurse Practitioner

## 2017-07-06 DIAGNOSIS — R748 Abnormal levels of other serum enzymes: Secondary | ICD-10-CM | POA: Insufficient documentation

## 2017-07-06 DIAGNOSIS — Z9049 Acquired absence of other specified parts of digestive tract: Secondary | ICD-10-CM | POA: Insufficient documentation

## 2017-07-21 ENCOUNTER — Ambulatory Visit (HOSPITAL_COMMUNITY)
Admission: RE | Admit: 2017-07-21 | Discharge: 2017-07-21 | Disposition: A | Discharge status: Home / Self Care | Payer: Medicare HMO | Source: Ambulatory Visit | Attending: Internal Medicine | Admitting: Internal Medicine

## 2017-07-21 ENCOUNTER — Encounter (HOSPITAL_COMMUNITY): Payer: Self-pay

## 2017-07-21 ENCOUNTER — Inpatient Hospital Stay: Payer: Medicare HMO | Attending: Oncology

## 2017-07-21 DIAGNOSIS — C3491 Malignant neoplasm of unspecified part of right bronchus or lung: Secondary | ICD-10-CM

## 2017-07-21 DIAGNOSIS — C342 Malignant neoplasm of middle lobe, bronchus or lung: Secondary | ICD-10-CM | POA: Diagnosis present

## 2017-07-21 DIAGNOSIS — I700 Atherosclerosis of aorta: Secondary | ICD-10-CM | POA: Insufficient documentation

## 2017-07-21 DIAGNOSIS — J439 Emphysema, unspecified: Secondary | ICD-10-CM | POA: Diagnosis not present

## 2017-07-21 DIAGNOSIS — R918 Other nonspecific abnormal finding of lung field: Secondary | ICD-10-CM | POA: Diagnosis not present

## 2017-07-21 DIAGNOSIS — C3490 Malignant neoplasm of unspecified part of unspecified bronchus or lung: Secondary | ICD-10-CM | POA: Diagnosis not present

## 2017-07-21 DIAGNOSIS — Z79899 Other long term (current) drug therapy: Secondary | ICD-10-CM | POA: Insufficient documentation

## 2017-07-21 DIAGNOSIS — Z5112 Encounter for antineoplastic immunotherapy: Secondary | ICD-10-CM | POA: Diagnosis present

## 2017-07-21 LAB — CMP (CANCER CENTER ONLY)
ALT: 16 U/L (ref 0–55)
ANION GAP: 10 (ref 3–11)
AST: 18 U/L (ref 5–34)
Albumin: 3.5 g/dL (ref 3.5–5.0)
Alkaline Phosphatase: 252 U/L — ABNORMAL HIGH (ref 40–150)
BUN: 12 mg/dL (ref 7–26)
CO2: 27 mmol/L (ref 22–29)
Calcium: 10 mg/dL (ref 8.4–10.4)
Chloride: 105 mmol/L (ref 98–109)
Creatinine: 0.69 mg/dL (ref 0.60–1.10)
GFR, Estimated: >60 mL/min (ref >60)
GLUCOSE: 79 mg/dL (ref 70–140)
POTASSIUM: 4.1 mmol/L (ref 3.5–5.1)
SODIUM: 142 mmol/L (ref 136–145)
TOTAL PROTEIN: 7.5 g/dL (ref 6.4–8.3)

## 2017-07-21 LAB — CBC WITH DIFFERENTIAL (CANCER CENTER ONLY)
BASOS PCT: 0 %
Basophils Absolute: 0 10*3/uL (ref 0.0–0.1)
EOS ABS: 0.1 10*3/uL (ref 0.0–0.5)
Eosinophils Relative: 2 %
HEMATOCRIT: 42 % (ref 34.8–46.6)
Hemoglobin: 14 g/dL (ref 11.6–15.9)
LYMPHS ABS: 1.3 10*3/uL (ref 0.9–3.3)
Lymphocytes Relative: 16 %
MCH: 28.1 pg (ref 25.1–34.0)
MCHC: 33.2 g/dL (ref 31.5–36.0)
MCV: 84.6 fL (ref 79.5–101.0)
MONO ABS: 0.6 10*3/uL (ref 0.1–0.9)
MONOS PCT: 8 %
Neutro Abs: 5.8 10*3/uL (ref 1.5–6.5)
Neutrophils Relative %: 74 %
Platelet Count: 299 10*3/uL (ref 145–400)
RBC: 4.97 MIL/uL (ref 3.70–5.45)
RDW: 15.7 % — AB (ref 11.2–14.5)
WBC Count: 7.9 10*3/uL (ref 3.9–10.3)

## 2017-07-21 LAB — TSH: TSH: <0.08 u[IU]/mL — ABNORMAL LOW (ref 0.308–3.960)

## 2017-07-21 MED ORDER — IOHEXOL 300 MG/ML  SOLN
75.0000 mL | Freq: Once | INTRAMUSCULAR | Status: AC | PRN
  Administered 2017-07-21: 75 mL via INTRAVENOUS

## 2017-07-23 ENCOUNTER — Telehealth: Payer: Self-pay | Admitting: Internal Medicine

## 2017-07-23 ENCOUNTER — Inpatient Hospital Stay: Payer: Medicare HMO

## 2017-07-23 ENCOUNTER — Inpatient Hospital Stay (HOSPITAL_BASED_OUTPATIENT_CLINIC_OR_DEPARTMENT_OTHER): Payer: Medicare HMO | Admitting: Internal Medicine

## 2017-07-23 ENCOUNTER — Encounter: Payer: Self-pay | Admitting: Internal Medicine

## 2017-07-23 VITALS — BP 131/87 | HR 114 | Temp 98.4°F | Resp 17 | Ht 67.0 in | Wt 136.2 lb

## 2017-07-23 DIAGNOSIS — R51 Headache: Secondary | ICD-10-CM | POA: Diagnosis not present

## 2017-07-23 DIAGNOSIS — C342 Malignant neoplasm of middle lobe, bronchus or lung: Secondary | ICD-10-CM | POA: Diagnosis not present

## 2017-07-23 DIAGNOSIS — C3491 Malignant neoplasm of unspecified part of right bronchus or lung: Secondary | ICD-10-CM

## 2017-07-23 DIAGNOSIS — Z5112 Encounter for antineoplastic immunotherapy: Secondary | ICD-10-CM

## 2017-07-23 MED ORDER — SODIUM CHLORIDE 0.9 % IV SOLN
480.0000 mg | Freq: Once | INTRAVENOUS | Status: AC
  Administered 2017-07-23: 480 mg via INTRAVENOUS
  Filled 2017-07-23: qty 48

## 2017-07-23 MED ORDER — SODIUM CHLORIDE 0.9 % IV SOLN
Freq: Once | INTRAVENOUS | Status: AC
  Administered 2017-07-23: 11:00:00 via INTRAVENOUS

## 2017-07-23 NOTE — Progress Notes (Signed)
[place] [phone]   [phone]  OFFICE PROGRESS NOTE  Barry Dienes, NP [place] [address]  DIAGNOSIS: Stage IIIA (T1b, N2, M0) non-small cell lung cancer presented with right middle lobe pulmonary nodule, mediastinal lymphadenopathy and highly suspicious for small nodule in the left upper lobe that could change her stage to stage IV that could present another synchronous primary lesion in the left upper lobe. This was diagnosed in September 2017.  PRIOR THERAPY:  1) Concurrent chemoradiation with weekly carboplatin for AUC of 2 and paclitaxel 45 MG/M2 status post 6 cycles last dose was given 02/25/2016 with partial response. 2) Consolidation chemotherapy with carboplatin for AUC of 5 and paclitaxel 175 MG/M2 every 3 weeks with Neulasta support. First dose 04/22/2016. Status post 3 cycles.  CURRENT THERAPY: Second line treatment with immunotherapy with Nivolumab 480 mg IV every 4 weeks status post 3 cycles..  INTERVAL HISTORY: Courtney Zuniga 67 y.o. female returns to the clinic today for follow-up visit accompanied by her sister.  The patient is feeling fine today with no specific complaints except early morning headache.  She had a evaluation with MRI of the brain in January for the same problem that was negative.  She denied having any chest pain, shortness breath, cough or hemoptysis.  She denied having any fever or chills.  She has no nausea, vomiting, diarrhea or constipation.  She continues to tolerate her treatment with immunotherapy fairly well.  She had repeat CT scan of the chest performed recently and she is here for evaluation and discussion of her risk her results.   MEDICAL HISTORY: Past Medical History:  Diagnosis Date  . Anemia    as a young woman  . Arthritis    osteoartritis  . Asthma   . Brain tumor (benign) ([place]) 2005 [facility]   Benign  . Chronic headaches   . Chronic hip pain   . Chronic pain   . COPD  (chronic obstructive pulmonary disease) ([place])   . Coronary artery disease   . Depression   . Depression 05/15/2016  . Encounter for antineoplastic chemotherapy 01/10/2016  . GERD (gastroesophageal reflux disease)   . Hypertension   . Lung cancer ([place]) dx'd 01/2016   radiation and chemo done, none now  . NSTEMI (non-ST elevated myocardial infarction) ([place]) yrs ago  . On home O2    qhs 2 liters at hs and prn  . Pneumonia last time 2 yrs ago  . Shortness of breath dyspnea    with activity    ALLERGIES:  has No Known Allergies.  MEDICATIONS:  Current Outpatient Medications  Medication Sig Dispense Refill  . albuterol (PROVENTIL HFA;VENTOLIN HFA) 108 (90 BASE) MCG/ACT inhaler Inhale 2 puffs into the lungs every 4 (four) hours as needed for shortness of breath.     Marland Kitchen albuterol (PROVENTIL) (2.5 MG/3ML) 0.083% nebulizer solution Take 2.5 mg by nebulization every 6 (six) hours as needed for wheezing or shortness of breath.    . Aspirin-Salicylamide-Caffeine (BC HEADACHE POWDER PO) Take 1 packet by mouth 2 (two) times daily as needed (headache).     Marland Kitchen BREO ELLIPTA 100-25 MCG/INH AEPB Inhale 1 puff into the lungs daily as needed.    . Calcium Carb-Cholecalciferol (CALCIUM/VITAMIN D PO) Take 1 tablet by mouth daily.    . cyclobenzaprine (FLEXERIL) 10 MG tablet Take 10 mg by mouth 3 (three) times daily.     . Fluticasone-Salmeterol (ADVAIR) 500-50 MCG/DOSE AEPB Inhale 1 puff  into the lungs daily.     Marland Kitchen guaifenesin (ROBITUSSIN) 100 MG/5ML syrup Take 200 mg by mouth daily as needed for cough.    . lidocaine-prilocaine (EMLA) cream APPLY 2 GRAMS TO AFFECTED AREA(S) UP TO 4 TIMES DAILY  2  . meloxicam (MOBIC) 15 MG tablet Take 15 mg by mouth daily.    . metoprolol tartrate (LOPRESSOR) 25 MG tablet Take 1 tablet by mouth 2 (two) times daily.    . mirtazapine (REMERON) 15 MG tablet Take 1 tablet (15 mg total) by mouth at bedtime. 30 tablet 0  . omega-3 acid ethyl esters (LOVAZA) 1 g capsule Take 2  capsules by mouth 2 (two) times daily.  1  . omeprazole (PRILOSEC) 20 MG capsule Take 20 mg by mouth daily.    Marland Kitchen OVER THE COUNTER MEDICATION Take 1 packet by mouth at bedtime as needed (sleep). BC powder pm    . prochlorperazine (COMPAZINE) 10 MG tablet TAKE 1 TABLET BY MOUTH EVERY 6 HOURS AS NEEDED FOR NAUSEA & VOMITING (Patient not taking: Reported on 06/25/2017) 30 tablet 0  . Pseudoeph-Doxylamine-DM-APAP (NYQUIL PO) Take 1 Dose by mouth at bedtime as needed (cough).    . sucralfate (CARAFATE) 1 g tablet Take 1 tablet (1 g total) by mouth 4 (four) times daily. (Patient taking differently: Take 1 g by mouth 2 (two) times daily. ) 12 tablet 0   No current facility-administered medications for this visit.     SURGICAL HISTORY:  Past Surgical History:  Procedure Laterality Date  . CHOLECYSTECTOMY    . COLONOSCOPY  2015   [facility]  . COLONOSCOPY    . ESOPHAGOGASTRODUODENOSCOPY N/A 08/14/2015   Procedure: ESOPHAGOGASTRODUODENOSCOPY (EGD);  Surgeon: Daneil Dolin, MD;  [facility];  Service: Endoscopy;  Laterality: N/A;  215   . ESOPHAGOGASTRODUODENOSCOPY (EGD) WITH PROPOFOL N/A 09/13/2015   Procedure: ESOPHAGOGASTRODUODENOSCOPY (EGD) WITH PROPOFOL;  Surgeon: Milus Banister, MD;  [facility];  Service: Endoscopy;  Laterality: N/A;  . EUS N/A 03/12/2017   Procedure: UPPER ENDOSCOPIC ULTRASOUND (EUS) RADIAL;  Surgeon: Milus Banister, MD;  [facility];  Service: Endoscopy;  Laterality: N/A;  . TUMOR REMOVAL  2005   Benign  . UPPER ESOPHAGEAL ENDOSCOPIC ULTRASOUND (EUS)  09/13/2015   Procedure: UPPER ESOPHAGEAL ENDOSCOPIC ULTRASOUND (EUS);  Surgeon: Milus Banister, MD;  Location: [facility];  Service: Endoscopy;;  . VIDEO BRONCHOSCOPY WITH ENDOBRONCHIAL NAVIGATION N/A 12/31/2015   Procedure: VIDEO BRONCHOSCOPY WITH ENDOBRONCHIAL NAVIGATION;  Surgeon: Melrose Nakayama, MD;  Location: [facility];  Service: Thoracic;  Laterality:  N/A;  . VIDEO BRONCHOSCOPY WITH ENDOBRONCHIAL ULTRASOUND N/A 11/08/2015   Procedure: VIDEO BRONCHOSCOPY WITH ENDOBRONCHIAL ULTRASOUND;  Surgeon: Ivin Poot, MD;  [facility];  Service: Thoracic;  Laterality: N/A;    REVIEW OF SYSTEMS:  Constitutional: positive for fatigue Eyes: negative Ears, nose, mouth, throat, and face: negative Respiratory: negative Cardiovascular: negative Gastrointestinal: negative Genitourinary:negative Integument/breast: negative Hematologic/lymphatic: negative Musculoskeletal:negative Neurological: positive for headaches Behavioral/Psych: negative Endocrine: negative Allergic/Immunologic: negative   PHYSICAL EXAMINATION: General appearance: alert, cooperative and no distress Head: Normocephalic, without obvious abnormality, atraumatic Neck: no adenopathy, no JVD, supple, symmetrical, trachea midline and thyroid not enlarged, symmetric, no tenderness/mass/nodules Lymph nodes: Cervical, supraclavicular, and axillary nodes normal. Resp: clear to auscultation bilaterally Back: symmetric, no curvature. ROM normal. No CVA tenderness. Cardio: regular rate and rhythm, S1, S2 normal, no murmur, click, rub or gallop GI: soft, non-tender; bowel sounds normal; no masses,  no organomegaly Extremities: extremities normal, atraumatic, no cyanosis or edema Neurologic: Alert and oriented X 3, normal strength and tone. Normal symmetric reflexes. Normal coordination and gait  ECOG PERFORMANCE STATUS: 1 - Symptomatic but completely ambulatory  Blood pressure 131/87, pulse (!) 114, temperature 98.4 F (36.9 C), temperature source Oral, resp. rate 17, height 5\' 7"  (1.702 m), weight 136 lb 3.2 oz (61.8 kg), SpO2 97 %.  LABORATORY DATA: Lab Results  Component Value Date   WBC 7.9 07/21/2017   HGB 14.3 04/09/2017   HCT 42.0 07/21/2017   MCV 84.6 07/21/2017   PLT 299 07/21/2017      Chemistry      Component Value Date/Time   NA 142 07/21/2017 0839   NA 144  04/09/2017 0948   K 4.1 07/21/2017 0839   K 4.3 04/09/2017 0948   CL 105 07/21/2017 0839   CO2 27 07/21/2017 0839   CO2 25 04/09/2017 0948   BUN 12 07/21/2017 0839   BUN 16.8 04/09/2017 0948   CREATININE 0.69 07/21/2017 0839   CREATININE 0.8 04/09/2017 0948      Component Value Date/Time   CALCIUM 10.0 07/21/2017 0839   CALCIUM 9.1 04/09/2017 0948   ALKPHOS 252 (H) 07/21/2017 0839   ALKPHOS 216 (H) 04/09/2017 0948   AST 18 07/21/2017 0839   AST 12 04/09/2017 0948   ALT 16 07/21/2017 0839   ALT 13 04/09/2017 0948   BILITOT <0.2 (L) 07/21/2017 0839   BILITOT 0.22 04/09/2017 0948       RADIOGRAPHIC STUDIES: Ct Chest W Contrast  Result Date: 07/21/2017 CLINICAL DATA:  Followup right middle lobe non-small cell lung carcinoma. Previous radiation therapy. Ongoing chemotherapy. EXAM: CT CHEST WITH CONTRAST TECHNIQUE: Multidetector CT imaging of the chest was performed during intravenous contrast administration. CONTRAST:  65mL OMNIPAQUE IOHEXOL 300 MG/ML  SOLN COMPARISON:  04/20/2017 FINDINGS: Cardiovascular: No acute findings. Aortic and coronary artery atherosclerosis. Mediastinum/Nodes: No masses or pathologically enlarged lymph nodes identified. Lungs/Pleura: Moderate emphysema again demonstrated. Chronic right middle lobe atelectasis and mild central bronchiectasis appears stable. No evidence of central airway obstruction. Previously seen 8 mm pulmonary nodule in the left upper lobe is nearly completely resolved since previous study. Sub-cm right lung nodules in the posterior right upper lobe and superior right lower lobe, which measure 6 mm and 7 mm respectively, show no significant change. Other smaller scattered right lung nodules are also unchanged. No new or enlarging pulmonary nodules or masses identified. No evidence of pulmonary infiltrate or pleural effusion. Upper Abdomen:  Unremarkable. Musculoskeletal:  No suspicious bone lesions. IMPRESSION: No new or progressive disease within  the thorax. Near complete resolution of left upper lobe pulmonary nodule since previous study. Sub-cm right lung nodules remains stable. Aortic Atherosclerosis (ICD10-I70.0) and Emphysema (ICD10-J43.9). Coronary artery calcification. Electronically Signed   By: Earle Gell M.D.   On: 07/21/2017 13:03   US Abdomen Limited Ruq  Result Date: 07/06/2017 CLINICAL DATA:  Elevated alkaline phosphatase. Previous cholecystectomy. History of lung malignancy. EXAM: ULTRASOUND ABDOMEN LIMITED RIGHT UPPER QUADRANT COMPARISON:  PET-CT study of October 23, 2015 and abdominal and pelvic CT scan of July 13, 2015. FINDINGS: Gallbladder: The gallbladder is surgically absent. Common bile duct: Diameter: 4.5 mm Liver: The hepatic echotexture is normal. The surface contour is smooth. There is no focal mass nor ductal dilation. Portal vein is patent on color Doppler imaging with normal direction of blood flow towards the liver. IMPRESSION: Normal appearance of the liver and common bile duct. Previous cholecystectomy. Electronically  Signed   By: David  Martinique M.D.   On: 07/06/2017 09:15    ASSESSMENT AND PLAN:  This is a very pleasant 67 years old African-American female with a stage IIIA non-small cell lung cancer status post concurrent chemoradiation followed by consolidation chemotherapy with carboplatin and paclitaxel for 3 cycles. The patient was followed by observation but restaging imaging studies showed evidence for disease progression. She was a started on second line treatment with immunotherapy with Nivolumab 480 mg IV every 4 weeks status post 3 cycles.   The patient continues to tolerate the treatment well with no concerning complaints. She had repeat CT scan of the chest performed recently.  I personally and independently reviewed the scans and discussed the results with the patient and her sister.  Her scan showed improvement of her disease.  There was no concerning findings for progression. I recommended for the  patient to continue her current treatment with Nivolumab and she will start cycle #4 today. She will come back for follow-up visit in 4 weeks for evaluation before the next cycle of her treatment. For the headache, we will continue to monitor her closely. She was advised to call immediately if she has any concerning symptoms in the interval. The patient voices understanding of current disease status and treatment options and is in agreement with the current care plan. All questions were answered. The patient knows to call the clinic with any problems, questions or concerns. We can certainly see the patient much sooner if necessary.  Disclaimer: This note was dictated with voice recognition software. Similar sounding words can inadvertently be transcribed and may not be corrected upon review.

## 2017-07-23 NOTE — Telephone Encounter (Signed)
Scheduled appt per 4/11 los - patient to get an updated schedule in the treatment area.

## 2017-08-20 ENCOUNTER — Inpatient Hospital Stay: Payer: Medicare HMO | Attending: Oncology

## 2017-08-20 ENCOUNTER — Inpatient Hospital Stay (HOSPITAL_BASED_OUTPATIENT_CLINIC_OR_DEPARTMENT_OTHER): Payer: Medicare HMO | Admitting: Internal Medicine

## 2017-08-20 ENCOUNTER — Telehealth: Payer: Self-pay

## 2017-08-20 ENCOUNTER — Inpatient Hospital Stay: Payer: Medicare HMO

## 2017-08-20 ENCOUNTER — Encounter: Payer: Self-pay | Admitting: Internal Medicine

## 2017-08-20 VITALS — BP 102/75 | HR 74 | Temp 98.1°F | Resp 18 | Ht 67.0 in | Wt 135.8 lb

## 2017-08-20 DIAGNOSIS — Z5112 Encounter for antineoplastic immunotherapy: Secondary | ICD-10-CM | POA: Insufficient documentation

## 2017-08-20 DIAGNOSIS — K5900 Constipation, unspecified: Secondary | ICD-10-CM

## 2017-08-20 DIAGNOSIS — Z79899 Other long term (current) drug therapy: Secondary | ICD-10-CM | POA: Diagnosis not present

## 2017-08-20 DIAGNOSIS — C342 Malignant neoplasm of middle lobe, bronchus or lung: Secondary | ICD-10-CM

## 2017-08-20 DIAGNOSIS — C3491 Malignant neoplasm of unspecified part of right bronchus or lung: Secondary | ICD-10-CM

## 2017-08-20 LAB — CBC WITH DIFFERENTIAL (CANCER CENTER ONLY)
BASOS ABS: 0.1 10*3/uL (ref 0.0–0.1)
Basophils Relative: 1 %
EOS PCT: 2 %
Eosinophils Absolute: 0.1 10*3/uL (ref 0.0–0.5)
HEMATOCRIT: 45.4 % (ref 34.8–46.6)
HEMOGLOBIN: 15.2 g/dL (ref 11.6–15.9)
LYMPHS ABS: 1.9 10*3/uL (ref 0.9–3.3)
LYMPHS PCT: 27 %
MCH: 28.3 pg (ref 25.1–34.0)
MCHC: 33.6 g/dL (ref 31.5–36.0)
MCV: 84.2 fL (ref 79.5–101.0)
Monocytes Absolute: 0.5 10*3/uL (ref 0.1–0.9)
Monocytes Relative: 8 %
NEUTROS ABS: 4.4 10*3/uL (ref 1.5–6.5)
NEUTROS PCT: 62 %
PLATELETS: 250 10*3/uL (ref 145–400)
RBC: 5.38 MIL/uL (ref 3.70–5.45)
RDW: 15.7 % — ABNORMAL HIGH (ref 11.2–14.5)
WBC: 7.1 10*3/uL (ref 3.9–10.3)

## 2017-08-20 LAB — CMP (CANCER CENTER ONLY)
ALT: 16 U/L (ref 0–55)
ANION GAP: 7 (ref 3–11)
AST: 22 U/L (ref 5–34)
Albumin: 4 g/dL (ref 3.5–5.0)
Alkaline Phosphatase: 282 U/L — ABNORMAL HIGH (ref 40–150)
BUN: 24 mg/dL (ref 7–26)
CHLORIDE: 105 mmol/L (ref 98–109)
CO2: 27 mmol/L (ref 22–29)
Calcium: 10.1 mg/dL (ref 8.4–10.4)
Creatinine: 0.81 mg/dL (ref 0.60–1.10)
GFR, Estimated: >60 mL/min (ref >60)
Glucose, Bld: 51 mg/dL — ABNORMAL LOW (ref 70–140)
POTASSIUM: 4.8 mmol/L (ref 3.5–5.1)
Sodium: 139 mmol/L (ref 136–145)
TOTAL PROTEIN: 7.8 g/dL (ref 6.4–8.3)
Total Bilirubin: <0.2 mg/dL — ABNORMAL LOW (ref 0.2–1.2)

## 2017-08-20 LAB — TSH: TSH: 8.09 u[IU]/mL — AB (ref 0.308–3.960)

## 2017-08-20 MED ORDER — SODIUM CHLORIDE 0.9 % IV SOLN
480.0000 mg | Freq: Once | INTRAVENOUS | Status: AC
  Administered 2017-08-20: 480 mg via INTRAVENOUS
  Filled 2017-08-20: qty 48

## 2017-08-20 MED ORDER — SODIUM CHLORIDE 0.9 % IV SOLN
Freq: Once | INTRAVENOUS | Status: AC
  Administered 2017-08-20: 11:00:00 via INTRAVENOUS

## 2017-08-20 NOTE — Patient Instructions (Signed)
[facility] Discharge Instructions for Patients Receiving Chemotherapy  Today you received the following chemotherapy agents: Nivolumab  To help prevent nausea and vomiting after your treatment, we encourage you to take your nausea medication as directed.    If you develop nausea and vomiting that is not controlled by your nausea medication, call the clinic.   BELOW ARE SYMPTOMS THAT SHOULD BE REPORTED IMMEDIATELY:  *FEVER GREATER THAN 100.5 F  *CHILLS WITH OR WITHOUT FEVER  NAUSEA AND VOMITING THAT IS NOT CONTROLLED WITH YOUR NAUSEA MEDICATION  *UNUSUAL SHORTNESS OF BREATH  *UNUSUAL BRUISING OR BLEEDING  TENDERNESS IN MOUTH AND THROAT WITH OR WITHOUT PRESENCE OF ULCERS  *URINARY PROBLEMS  *BOWEL PROBLEMS  UNUSUAL RASH Items with * indicate a potential emergency and should be followed up as soon as possible.  Feel free to call the clinic should you have any questions or concerns. The clinic phone number is [phone].  Please show the CHEMO ALERT CARD at check-in to the Emergency Department and triage nurse.   

## 2017-08-20 NOTE — Telephone Encounter (Signed)
Printed avs and calender of upcoming appointment. Per 5/9 los 

## 2017-08-20 NOTE — Progress Notes (Signed)
[place] [phone]   [phone]  OFFICE PROGRESS NOTE  Barry Dienes, NP [place] [address]  DIAGNOSIS: Stage IIIA (T1b, N2, M0) non-small cell lung cancer presented with right middle lobe pulmonary nodule, mediastinal lymphadenopathy and highly suspicious for small nodule in the left upper lobe that could change her stage to stage IV that could present another synchronous primary lesion in the left upper lobe. This was diagnosed in September 2017.  PRIOR THERAPY:  1) Concurrent chemoradiation with weekly carboplatin for AUC of 2 and paclitaxel 45 MG/M2 status post 6 cycles last dose was given 02/25/2016 with partial response. 2) Consolidation chemotherapy with carboplatin for AUC of 5 and paclitaxel 175 MG/M2 every 3 weeks with Neulasta support. First dose 04/22/2016. Status post 3 cycles.  CURRENT THERAPY: Second line treatment with immunotherapy with Nivolumab 480 mg IV every 4 weeks status post 4 cycles..  INTERVAL HISTORY: Courtney Zuniga 67 y.o. female returns to the clinic today for follow-up visit.  The patient is feeling fine today with no concerning complaints except for aching pain in the lower extremities.  She also has constipation for the last few days.  She denied having any chest pain, shortness of breath, cough or hemoptysis.  She denied having any fever or chills.  She has no nausea, vomiting or diarrhea.  She denied having any significant weight loss or night sweats.  She is here for evaluation before starting cycle #5 of her treatment.  MEDICAL HISTORY: Past Medical History:  Diagnosis Date  . Anemia    as a young woman  . Arthritis    osteoartritis  . Asthma   . Brain tumor (benign) ([place]) 2005 [facility]   Benign  . Chronic headaches   . Chronic hip pain   . Chronic pain   . COPD (chronic obstructive pulmonary disease) ([place])   . Coronary artery disease   . Depression   . Depression 05/15/2016  . Encounter  for antineoplastic chemotherapy 01/10/2016  . GERD (gastroesophageal reflux disease)   . Hypertension   . Lung cancer ([place]) dx'd 01/2016   radiation and chemo done, none now  . NSTEMI (non-ST elevated myocardial infarction) ([place]) yrs ago  . On home O2    qhs 2 liters at hs and prn  . Pneumonia last time 2 yrs ago  . Shortness of breath dyspnea    with activity    ALLERGIES:  has No Known Allergies.  MEDICATIONS:  Current Outpatient Medications  Medication Sig Dispense Refill  . albuterol (PROVENTIL HFA;VENTOLIN HFA) 108 (90 BASE) MCG/ACT inhaler Inhale 2 puffs into the lungs every 4 (four) hours as needed for shortness of breath.     Marland Kitchen albuterol (PROVENTIL) (2.5 MG/3ML) 0.083% nebulizer solution Take 2.5 mg by nebulization every 6 (six) hours as needed for wheezing or shortness of breath.    . Aspirin-Salicylamide-Caffeine (BC HEADACHE POWDER PO) Take 1 packet by mouth 2 (two) times daily as needed (headache).     Marland Kitchen BREO ELLIPTA 100-25 MCG/INH AEPB Inhale 1 puff into the lungs daily as needed.    . Calcium Carb-Cholecalciferol (CALCIUM/VITAMIN D PO) Take 1 tablet by mouth daily.    . cyclobenzaprine (FLEXERIL) 10 MG tablet Take 10 mg by mouth 3 (three) times daily.     . Fluticasone-Salmeterol (ADVAIR) 500-50 MCG/DOSE AEPB Inhale 1 puff into the lungs daily.     Marland Kitchen guaifenesin (ROBITUSSIN) 100 MG/5ML syrup Take 200 mg by mouth daily as  needed for cough.    . lidocaine-prilocaine (EMLA) cream APPLY 2 GRAMS TO AFFECTED AREA(S) UP TO 4 TIMES DAILY  2  . meloxicam (MOBIC) 15 MG tablet Take 15 mg by mouth daily.    . metoprolol tartrate (LOPRESSOR) 25 MG tablet Take 1 tablet by mouth 2 (two) times daily.    . mirtazapine (REMERON) 15 MG tablet Take 1 tablet (15 mg total) by mouth at bedtime. 30 tablet 0  . omega-3 acid ethyl esters (LOVAZA) 1 g capsule Take 2 capsules by mouth 2 (two) times daily.  1  . omeprazole (PRILOSEC) 20 MG capsule Take 20 mg by mouth daily.    Marland Kitchen OVER THE COUNTER  MEDICATION Take 1 packet by mouth at bedtime as needed (sleep). BC powder pm    . prochlorperazine (COMPAZINE) 10 MG tablet TAKE 1 TABLET BY MOUTH EVERY 6 HOURS AS NEEDED FOR NAUSEA & VOMITING (Patient not taking: Reported on 06/25/2017) 30 tablet 0  . Pseudoeph-Doxylamine-DM-APAP (NYQUIL PO) Take 1 Dose by mouth at bedtime as needed (cough).    . sucralfate (CARAFATE) 1 g tablet Take 1 tablet (1 g total) by mouth 4 (four) times daily. (Patient taking differently: Take 1 g by mouth 2 (two) times daily. ) 12 tablet 0   No current facility-administered medications for this visit.     SURGICAL HISTORY:  Past Surgical History:  Procedure Laterality Date  . CHOLECYSTECTOMY    . COLONOSCOPY  2015   Results requested from [facility]  . COLONOSCOPY    . ESOPHAGOGASTRODUODENOSCOPY N/A 08/14/2015   Procedure: ESOPHAGOGASTRODUODENOSCOPY (EGD);  Surgeon: Daneil Dolin, MD;  Location: [facility];  Service: Endoscopy;  Laterality: N/A;  215   . ESOPHAGOGASTRODUODENOSCOPY (EGD) WITH PROPOFOL N/A 09/13/2015   Procedure: ESOPHAGOGASTRODUODENOSCOPY (EGD) WITH PROPOFOL;  Surgeon: Milus Banister, MD;  Location: [facility];  Service: Endoscopy;  Laterality: N/A;  . EUS N/A 03/12/2017   Procedure: UPPER ENDOSCOPIC ULTRASOUND (EUS) RADIAL;  Surgeon: Milus Banister, MD;  Location: [facility];  Service: Endoscopy;  Laterality: N/A;  . TUMOR REMOVAL  2005   Benign  . UPPER ESOPHAGEAL ENDOSCOPIC ULTRASOUND (EUS)  09/13/2015   Procedure: UPPER ESOPHAGEAL ENDOSCOPIC ULTRASOUND (EUS);  Surgeon: Milus Banister, MD;  Location: [facility];  Service: Endoscopy;;  . VIDEO BRONCHOSCOPY WITH ENDOBRONCHIAL NAVIGATION N/A 12/31/2015   Procedure: VIDEO BRONCHOSCOPY WITH ENDOBRONCHIAL NAVIGATION;  Surgeon: Melrose Nakayama, MD;  Location: [facility];  Service: Thoracic;  Laterality: N/A;  . VIDEO BRONCHOSCOPY WITH ENDOBRONCHIAL ULTRASOUND N/A 11/08/2015   Procedure: VIDEO BRONCHOSCOPY WITH ENDOBRONCHIAL  ULTRASOUND;  Surgeon: Ivin Poot, MD;  Location: [facility] OR;  Service: Thoracic;  Laterality: N/A;    REVIEW OF SYSTEMS:  A comprehensive review of systems was negative except for: Musculoskeletal: positive for arthralgias   PHYSICAL EXAMINATION: General appearance: alert, cooperative and no distress Head: Normocephalic, without obvious abnormality, atraumatic Neck: no adenopathy, no JVD, supple, symmetrical, trachea midline and thyroid not enlarged, symmetric, no tenderness/mass/nodules Lymph nodes: Cervical, supraclavicular, and axillary nodes normal. Resp: clear to auscultation bilaterally Back: symmetric, no curvature. ROM normal. No CVA tenderness. Cardio: regular rate and rhythm, S1, S2 normal, no murmur, click, rub or gallop GI: soft, non-tender; bowel sounds normal; no masses,  no organomegaly Extremities: extremities normal, atraumatic, no cyanosis or edema  ECOG PERFORMANCE STATUS: 1 - Symptomatic but completely ambulatory  Blood pressure 102/75, pulse 74, temperature 98.1 F (36.7 C), temperature source Oral, resp. rate 18, height 5\' 7"  (1.702 m), weight  135 lb 12.8 oz (61.6 kg), SpO2 94 %.  LABORATORY DATA: Lab Results  Component Value Date   WBC 7.1 08/20/2017   HGB 15.2 08/20/2017   HCT 45.4 08/20/2017   MCV 84.2 08/20/2017   PLT 250 08/20/2017      Chemistry      Component Value Date/Time   NA 142 07/21/2017 0839   NA 144 04/09/2017 0948   K 4.1 07/21/2017 0839   K 4.3 04/09/2017 0948   CL 105 07/21/2017 0839   CO2 27 07/21/2017 0839   CO2 25 04/09/2017 0948   BUN 12 07/21/2017 0839   BUN 16.8 04/09/2017 0948   CREATININE 0.69 07/21/2017 0839   CREATININE 0.8 04/09/2017 0948      Component Value Date/Time   CALCIUM 10.0 07/21/2017 0839   CALCIUM 9.1 04/09/2017 0948   ALKPHOS 252 (H) 07/21/2017 0839   ALKPHOS 216 (H) 04/09/2017 0948   AST 18 07/21/2017 0839   AST 12 04/09/2017 0948   ALT 16 07/21/2017 0839   ALT 13 04/09/2017 0948   BILITOT <0.2 (L)  07/21/2017 0839   BILITOT 0.22 04/09/2017 0948       RADIOGRAPHIC STUDIES: Ct Chest W Contrast  Result Date: 07/21/2017 CLINICAL DATA:  Followup right middle lobe non-small cell lung carcinoma. Previous radiation therapy. Ongoing chemotherapy. EXAM: CT CHEST WITH CONTRAST TECHNIQUE: Multidetector CT imaging of the chest was performed during intravenous contrast administration. CONTRAST:  92mL OMNIPAQUE IOHEXOL 300 MG/ML  SOLN COMPARISON:  04/20/2017 FINDINGS: Cardiovascular: No acute findings. Aortic and coronary artery atherosclerosis. Mediastinum/Nodes: No masses or pathologically enlarged lymph nodes identified. Lungs/Pleura: Moderate emphysema again demonstrated. Chronic right middle lobe atelectasis and mild central bronchiectasis appears stable. No evidence of central airway obstruction. Previously seen 8 mm pulmonary nodule in the left upper lobe is nearly completely resolved since previous study. Sub-cm right lung nodules in the posterior right upper lobe and superior right lower lobe, which measure 6 mm and 7 mm respectively, show no significant change. Other smaller scattered right lung nodules are also unchanged. No new or enlarging pulmonary nodules or masses identified. No evidence of pulmonary infiltrate or pleural effusion. Upper Abdomen:  Unremarkable. Musculoskeletal:  No suspicious bone lesions. IMPRESSION: No new or progressive disease within the thorax. Near complete resolution of left upper lobe pulmonary nodule since previous study. Sub-cm right lung nodules remains stable. Aortic Atherosclerosis (ICD10-I70.0) and Emphysema (ICD10-J43.9). Coronary artery calcification. Electronically Signed   By: Earle Gell M.D.   On: 07/21/2017 13:03    ASSESSMENT AND PLAN:  This is a very pleasant 67 years old African-American female with a stage IIIA non-small cell lung cancer status post concurrent chemoradiation followed by consolidation chemotherapy with carboplatin and paclitaxel for 3  cycles. The patient was followed by observation but restaging imaging studies showed evidence for disease progression. She was a started on second line treatment with immunotherapy with Nivolumab 480 mg IV every 4 weeks status post 4 cycles.   The patient continues to tolerate this treatment well with no concerning complaints. I recommended for her to proceed with cycle #5 today as a scheduled. I will see her back for follow-up visit in 4 weeks for evaluation before the next cycle of her treatment. For the constipation, she will use over-the-counter laxative with Senokot or MiraLAX. She was advised to call immediately if she has any concerning symptoms in the interval. The patient voices understanding of current disease status and treatment options and is in agreement with the current care  plan. All questions were answered. The patient knows to call the clinic with any problems, questions or concerns. We can certainly see the patient much sooner if necessary.  Disclaimer: This note was dictated with voice recognition software. Similar sounding words can inadvertently be transcribed and may not be corrected upon review.

## 2017-09-17 ENCOUNTER — Telehealth: Payer: Self-pay | Admitting: Nurse Practitioner

## 2017-09-17 ENCOUNTER — Inpatient Hospital Stay (HOSPITAL_BASED_OUTPATIENT_CLINIC_OR_DEPARTMENT_OTHER): Payer: Medicare HMO | Admitting: Nurse Practitioner

## 2017-09-17 ENCOUNTER — Inpatient Hospital Stay: Payer: Medicare HMO

## 2017-09-17 ENCOUNTER — Inpatient Hospital Stay: Payer: Medicare HMO | Attending: Oncology

## 2017-09-17 ENCOUNTER — Encounter: Payer: Self-pay | Admitting: Nurse Practitioner

## 2017-09-17 VITALS — BP 105/70 | HR 83 | Temp 98.4°F | Resp 18 | Ht 67.0 in | Wt 139.9 lb

## 2017-09-17 DIAGNOSIS — C3491 Malignant neoplasm of unspecified part of right bronchus or lung: Secondary | ICD-10-CM

## 2017-09-17 DIAGNOSIS — Z5112 Encounter for antineoplastic immunotherapy: Secondary | ICD-10-CM | POA: Diagnosis not present

## 2017-09-17 DIAGNOSIS — C342 Malignant neoplasm of middle lobe, bronchus or lung: Secondary | ICD-10-CM

## 2017-09-17 DIAGNOSIS — R05 Cough: Secondary | ICD-10-CM | POA: Diagnosis not present

## 2017-09-17 DIAGNOSIS — R0609 Other forms of dyspnea: Secondary | ICD-10-CM | POA: Diagnosis not present

## 2017-09-17 LAB — CMP (CANCER CENTER ONLY)
ALBUMIN: 3.7 g/dL (ref 3.5–5.0)
ALK PHOS: 270 U/L — AB (ref 40–150)
ALT: 16 U/L (ref 0–55)
AST: 22 U/L (ref 5–34)
Anion gap: 10 (ref 3–11)
BUN: 23 mg/dL (ref 7–26)
CALCIUM: 9.2 mg/dL (ref 8.4–10.4)
CHLORIDE: 109 mmol/L (ref 98–109)
CO2: 22 mmol/L (ref 22–29)
CREATININE: 0.79 mg/dL (ref 0.60–1.10)
GFR, Estimated: >60 mL/min (ref >60)
GLUCOSE: 69 mg/dL — AB (ref 70–140)
Potassium: 3.9 mmol/L (ref 3.5–5.1)
SODIUM: 141 mmol/L (ref 136–145)
Total Bilirubin: <0.2 mg/dL — ABNORMAL LOW (ref 0.2–1.2)
Total Protein: 6.9 g/dL (ref 6.4–8.3)

## 2017-09-17 LAB — CBC WITH DIFFERENTIAL (CANCER CENTER ONLY)
BASOS ABS: 0 10*3/uL (ref 0.0–0.1)
BASOS PCT: 0 %
EOS ABS: 0.1 10*3/uL (ref 0.0–0.5)
EOS PCT: 1 %
HCT: 40.9 % (ref 34.8–46.6)
HEMOGLOBIN: 13.9 g/dL (ref 11.6–15.9)
LYMPHS ABS: 2 10*3/uL (ref 0.9–3.3)
Lymphocytes Relative: 28 %
MCH: 28.5 pg (ref 25.1–34.0)
MCHC: 34 g/dL (ref 31.5–36.0)
MCV: 83.8 fL (ref 79.5–101.0)
Monocytes Absolute: 0.4 10*3/uL (ref 0.1–0.9)
Monocytes Relative: 6 %
Neutro Abs: 4.4 10*3/uL (ref 1.5–6.5)
Neutrophils Relative %: 65 %
Platelet Count: 218 10*3/uL (ref 145–400)
RBC: 4.88 MIL/uL (ref 3.70–5.45)
RDW: 17.1 % — ABNORMAL HIGH (ref 11.2–14.5)
WBC: 6.9 10*3/uL (ref 3.9–10.3)

## 2017-09-17 LAB — TSH: TSH: 8.032 u[IU]/mL — ABNORMAL HIGH (ref 0.308–3.960)

## 2017-09-17 MED ORDER — SODIUM CHLORIDE 0.9 % IV SOLN
480.0000 mg | Freq: Once | INTRAVENOUS | Status: AC
  Administered 2017-09-17: 480 mg via INTRAVENOUS
  Filled 2017-09-17: qty 48

## 2017-09-17 MED ORDER — SODIUM CHLORIDE 0.9 % IV SOLN
Freq: Once | INTRAVENOUS | Status: AC
  Administered 2017-09-17: 11:00:00 via INTRAVENOUS

## 2017-09-17 NOTE — Telephone Encounter (Signed)
Central radiology to contact patient with ct scan per 6/6 los.

## 2017-09-17 NOTE — Patient Instructions (Signed)
[facility] Discharge Instructions for Patients Receiving Chemotherapy  Today you received the following chemotherapy agents nivolumab   To help prevent nausea and vomiting after your treatment, we encourage you to take your nausea medication as directed   If you develop nausea and vomiting that is not controlled by your nausea medication, call the clinic.   BELOW ARE SYMPTOMS THAT SHOULD BE REPORTED IMMEDIATELY:  *FEVER GREATER THAN 100.5 F  *CHILLS WITH OR WITHOUT FEVER  NAUSEA AND VOMITING THAT IS NOT CONTROLLED WITH YOUR NAUSEA MEDICATION  *UNUSUAL SHORTNESS OF BREATH  *UNUSUAL BRUISING OR BLEEDING  TENDERNESS IN MOUTH AND THROAT WITH OR WITHOUT PRESENCE OF ULCERS  *URINARY PROBLEMS  *BOWEL PROBLEMS  UNUSUAL RASH Items with * indicate a potential emergency and should be followed up as soon as possible.  Feel free to call the clinic you have any questions or concerns. The clinic phone number is [phone].  

## 2017-09-17 NOTE — Progress Notes (Signed)
  [place] OFFICE PROGRESS NOTE   DIAGNOSIS: Stage IIIA (T1b, N2, M0) non-small cell lung cancer presented with right middle lobe pulmonary nodule, mediastinal lymphadenopathy and highly suspicious for small nodule in the left upper lobe that could change her stage to stage IV that could present another synchronous primary lesion in the left upper lobe. This was diagnosed in September 2017.  PRIOR THERAPY:  1) Concurrent chemoradiation with weekly carboplatin for AUC of 2 and paclitaxel 45 MG/M2 status post 6 cycles last dose was given 02/25/2016 with partial response. 2) Consolidation chemotherapy with carboplatin for AUC of 5 and paclitaxel 175 MG/M2 every 3 weeks with Neulasta support. First dose 04/22/2016. Status post 3 cycles.  CURRENT THERAPY: Second line treatment with immunotherapy with Nivolumab 480 mg IV every 4 weeks status post  5 cycles.  INTERVAL HISTORY:   Courtney Zuniga returns as scheduled.  She completed cycle 5 nivolumab 08/20/2017.  She has very occasional mild nausea.  No mouth sores.  No diarrhea.  No rash.  She notes dyspnea on exertion which is unchanged.  Stable cough.  No fever.  Appetite is better.  She is gaining weight.  She denies pain.  Objective:  Vital signs in last 24 hours:  Blood pressure 105/70, pulse 83, temperature 98.4 F (36.9 C), resp. rate 18, height 5\' 7"  (1.702 m), weight 139 lb 14.4 oz (63.5 kg), SpO2 99 %.    HEENT: No thrush or ulcers. Resp: Lungs clear bilaterally. Cardio: Regular rate and rhythm. GI: Abdomen soft and nontender.  No hepatomegaly. Vascular: No leg edema. Neuro: Alert and oriented. Skin: No rash.   Lab Results:  Lab Results  Component Value Date   WBC 6.9 09/17/2017   HGB 13.9 09/17/2017   HCT 40.9 09/17/2017   MCV 83.8 09/17/2017   PLT 218 09/17/2017   NEUTROABS 4.4 09/17/2017    Imaging:  No results found.  Medications: I have reviewed the patient's current  medications.  Assessment/Plan: 1. Stage IIIa non-small cell lung cancer status post concurrent chemoradiation followed by consolidation chemotherapy with carboplatin and Taxol for 3 cycles.  Subsequent restaging studies showed evidence for disease progression.  She began second line treatment with nivolumab every 4 weeks 04/30/2017.  Cycle 5 completed 08/20/2017.  Disposition: Courtney Zuniga appears stable.  She has completed 5 cycles of monthly nivolumab.  Plan to proceed with cycle 6 today as scheduled.  Dr. Julien Nordmann recommends a restaging chest CT after this cycle.  We will try to get this scheduled for 10/12/2017 (due to transportation issues she needs the scan scheduled on a Monday).  She will return for a follow-up visit on 10/14/2017 to review the results.  She will contact the office in the interim with any problems.  Plan reviewed with Dr. Julien Nordmann.    Ned Card ANP/GNP-BC   09/17/2017  9:16 AM

## 2017-09-29 ENCOUNTER — Other Ambulatory Visit (HOSPITAL_COMMUNITY): Payer: Self-pay | Admitting: Nurse Practitioner

## 2017-09-29 DIAGNOSIS — Z1231 Encounter for screening mammogram for malignant neoplasm of breast: Secondary | ICD-10-CM

## 2017-10-01 IMAGING — DX DG CHEST 2V
2 series · 2 of 2 positions shown · non-contrast
Comparison: December 31, 2015 chest radiograph and chest CT December 07, 2015

CLINICAL DATA: History of lung carcinoma.  Chest pain.

EXAM:
CHEST  2 VIEW

[chest pa]
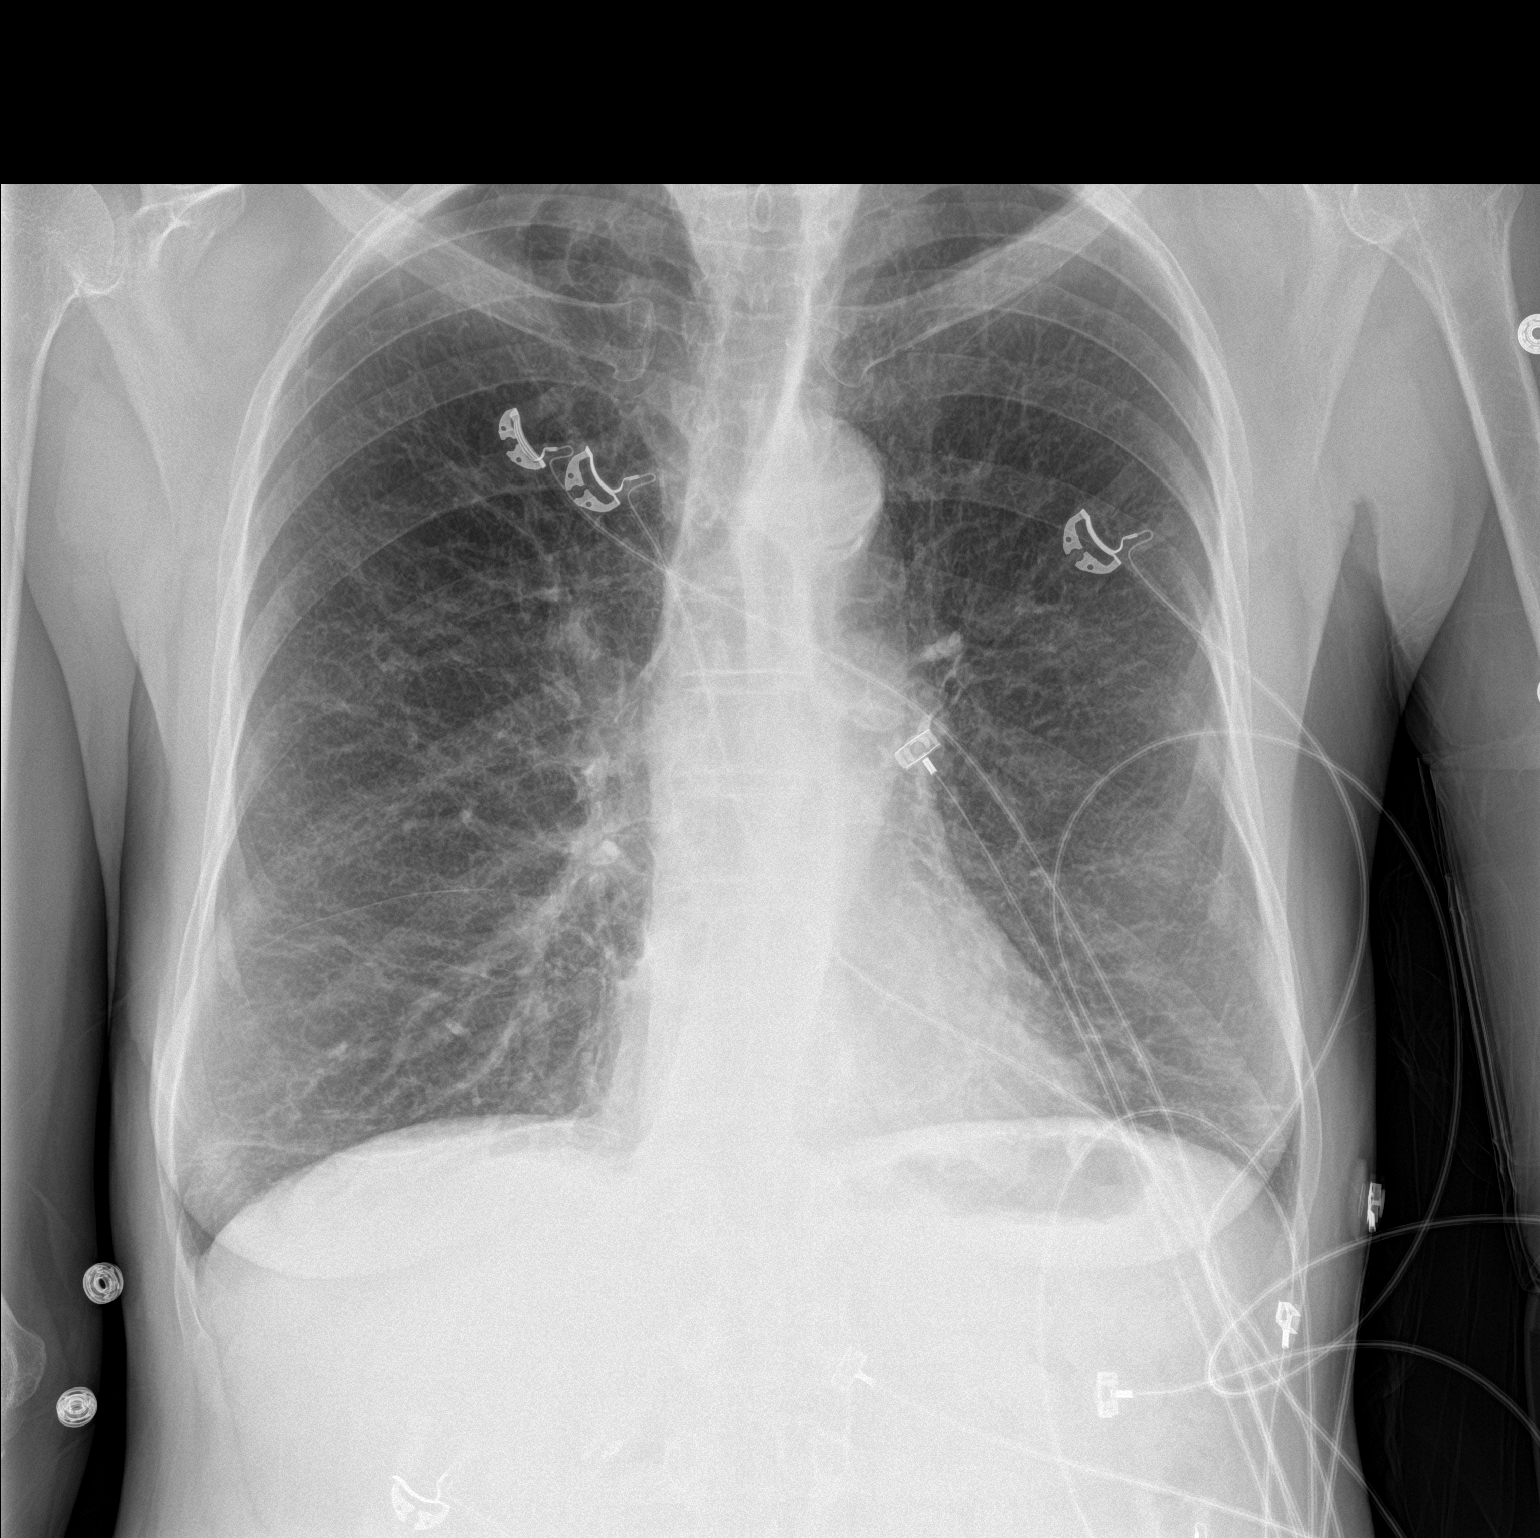

[chest lat]
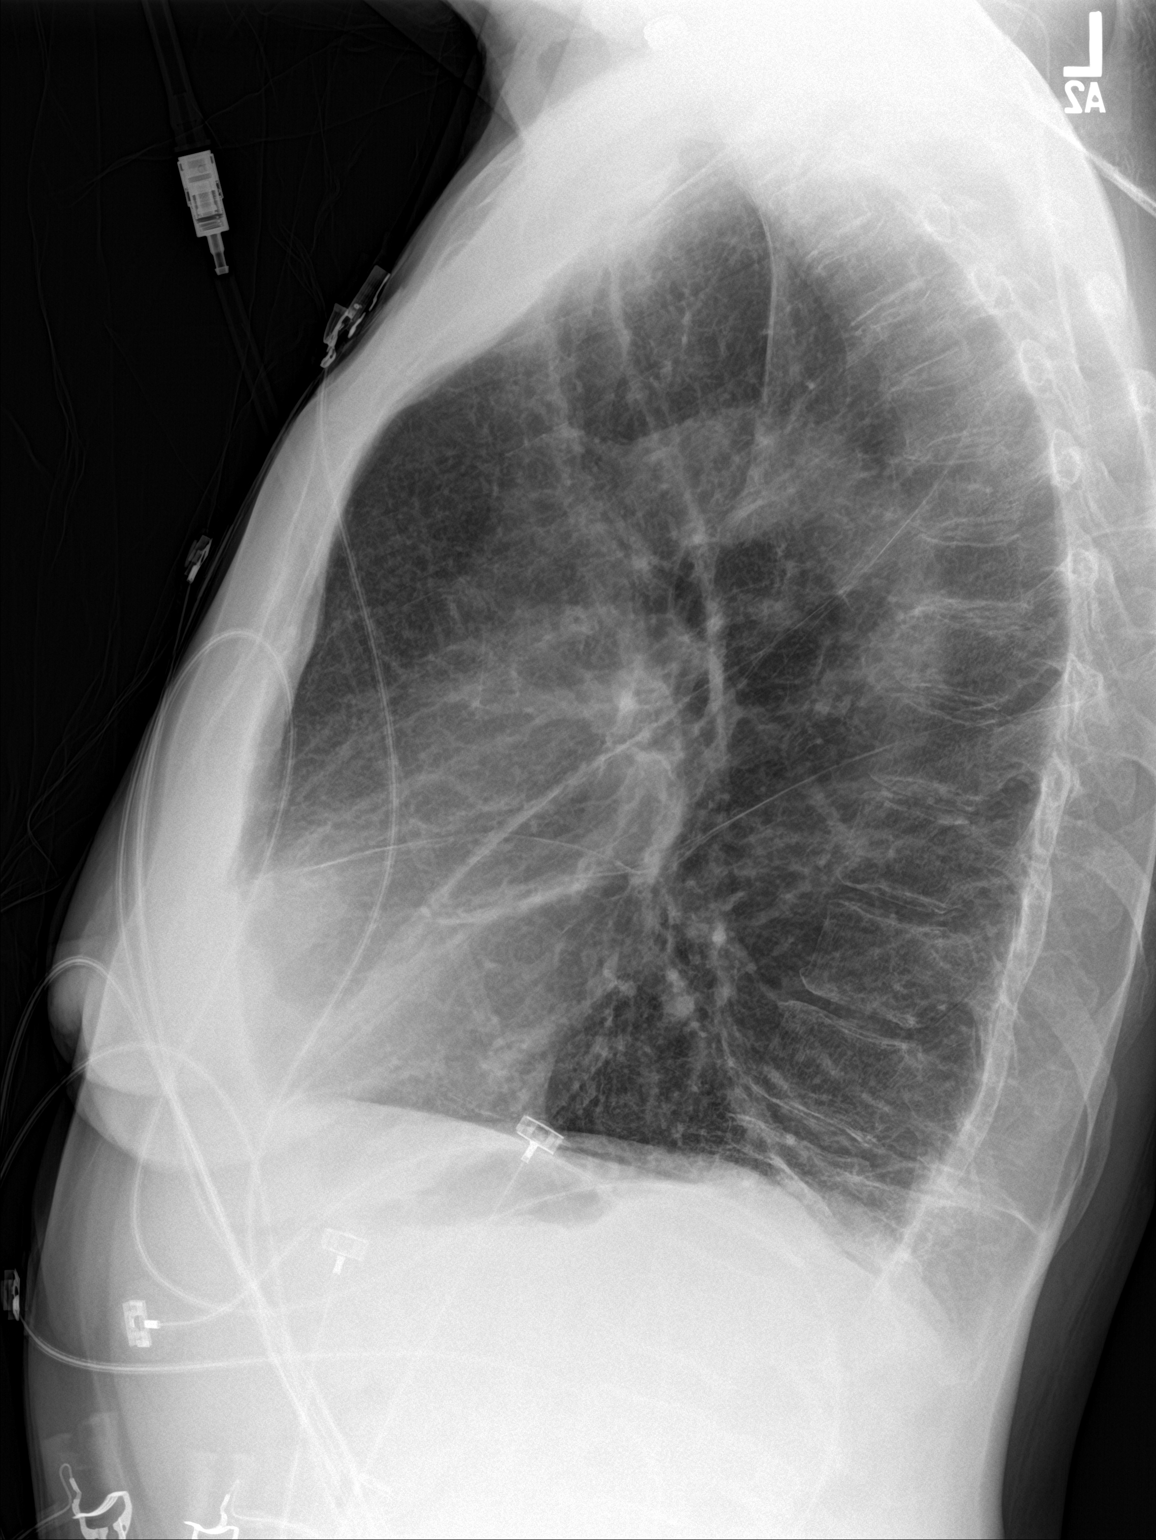

[2 of 2 positions shown; findings below may reference images not displayed]

FINDINGS: The mass anterior and inferior to the right hilum noted on prior CT
and chest radiograph is no longer appreciable as a discrete lesion.
There is scarring in the right middle lobe region in the area of
previous mass. Elsewhere, there is scarring with patchy interstitial
fibrosis in the mid lower lung zones. There is no new opacity. The
heart size and pulmonary vascularity are normal. No adenopathy.
There is atherosclerotic calcification in the aorta. No bone lesions
are evident.
IMPRESSION: Previously noted mass in the right infrahilar region is no longer
appreciable. There is scarring in this area. There is patchy
scarring and fibrosis in both lower lung zones bilaterally. There is
no edema or consolidation. The heart size is normal. No adenopathy.
There is aortic atherosclerosis.

## 2017-10-01 IMAGING — CT CT ANGIO CHEST
2 of 6 series · 18 of 46 positions shown · IV contrast (isovue)
Comparison: Chest CT December 07, 2015 and chest radiograph October 27, 2015

CLINICAL DATA: Chest pain. History of lung carcinoma. Tachycardia
and shortness of breath

EXAM:
CT ANGIOGRAPHY CHEST WITH CONTRAST
TECHNIQUE: Multidetector CT imaging of the chest was performed using the
standard protocol during bolus administration of intravenous
contrast. Multiplanar CT image reconstructions and MIPs were
obtained to evaluate the vascular anatomy.
CONTRAST:  100 mL Isovue 370 nonionic

[Series 6: thins · axial · 0.71mm/px · z∈[+123,+424]mm · 15 of 331 slices shown]
[im 15/331  lung]
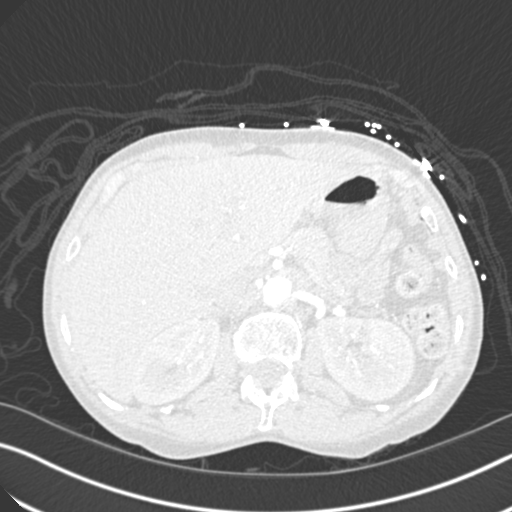
[im 44/331  soft-tissue]
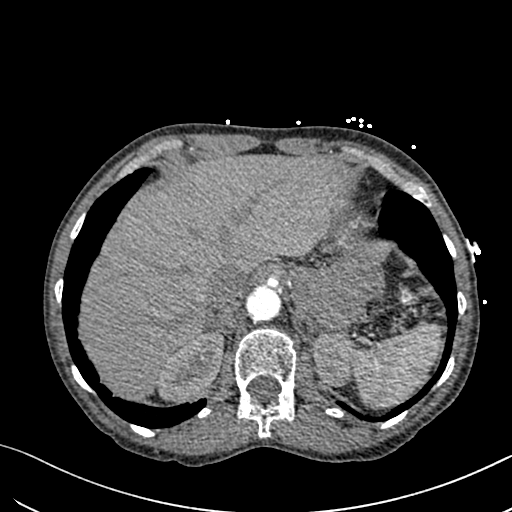
[im 58/331  lung]
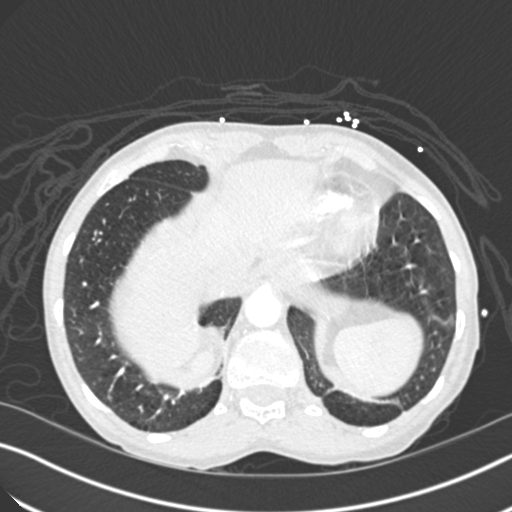
[im 87/331  soft-tissue]
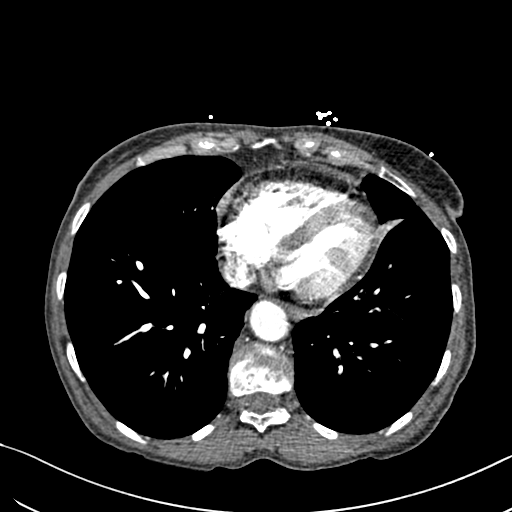
[im 101/331  lung]
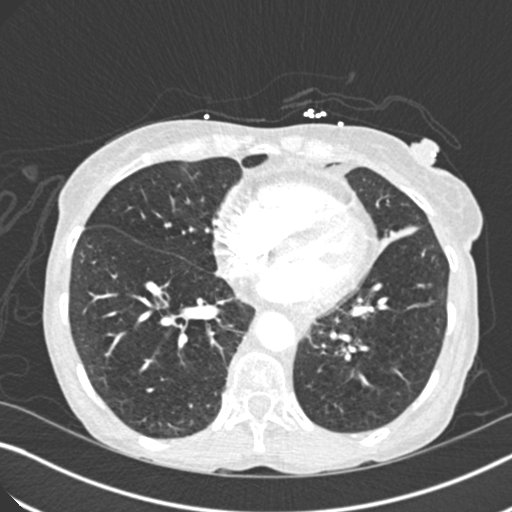
[im 130/331  soft-tissue]
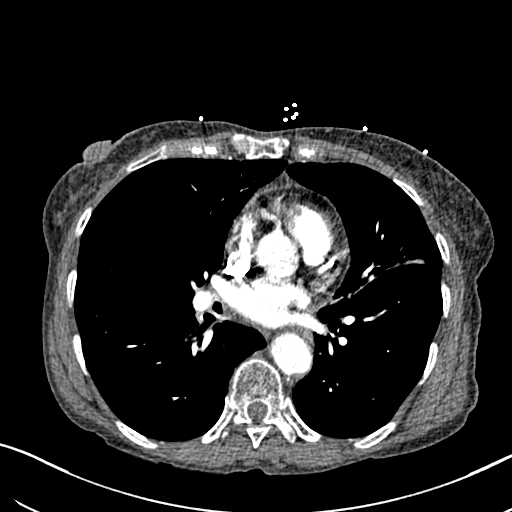
[im 144/331  lung]
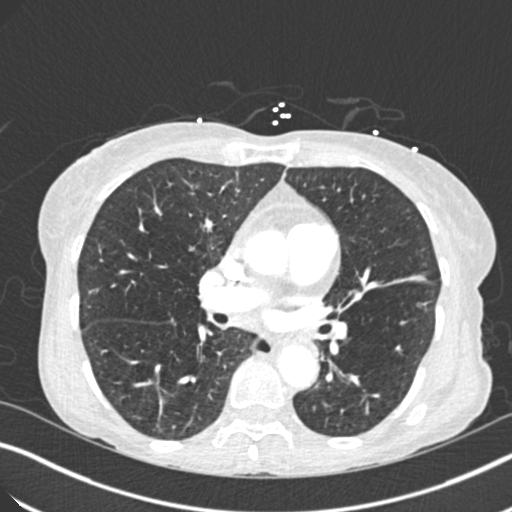
[im 173/331  soft-tissue]
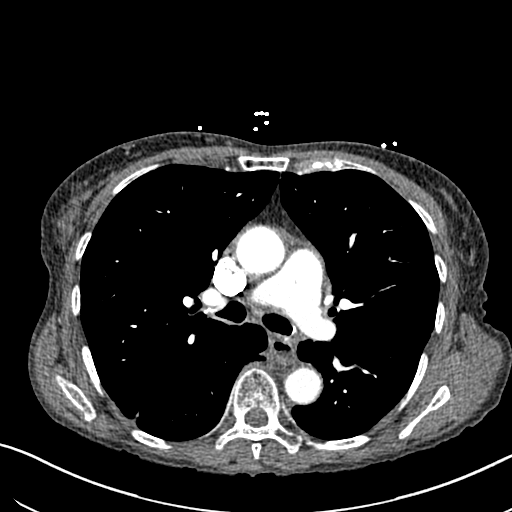
[im 187/331  lung]
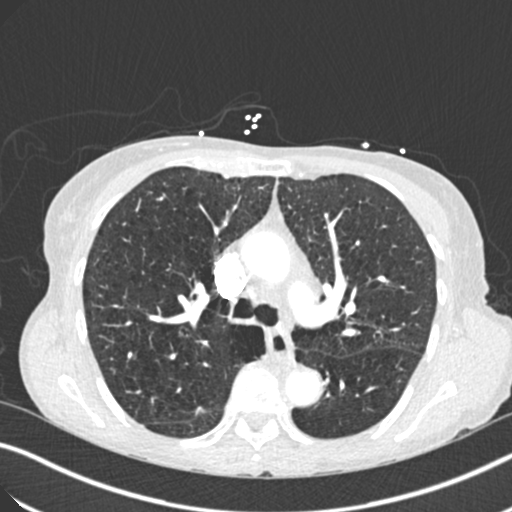
[im 201/331  soft-tissue]
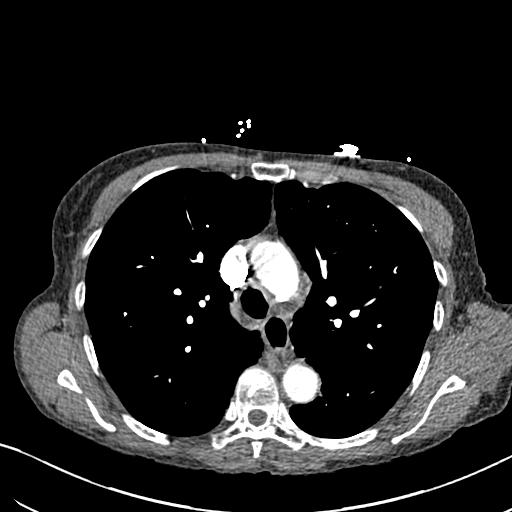
[im 230/331  lung]
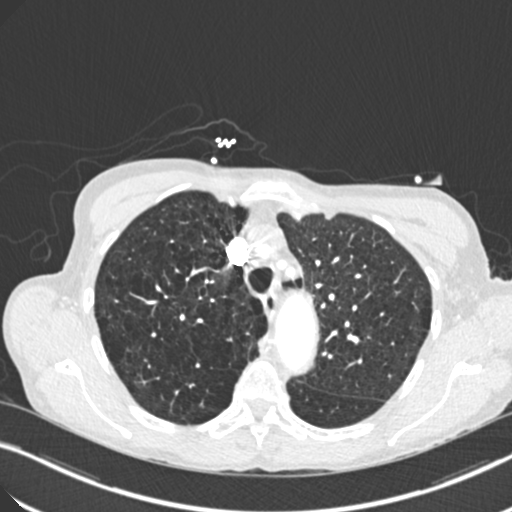
[im 244/331  soft-tissue]
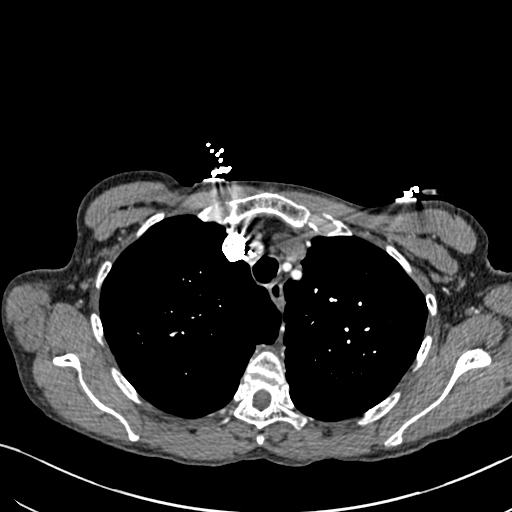
[im 273/331  lung]
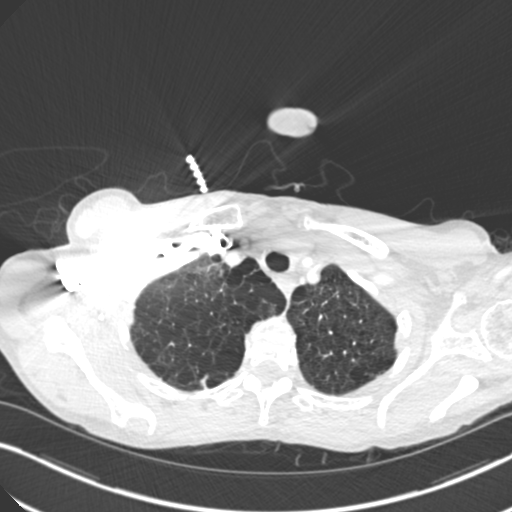
[im 287/331  soft-tissue]
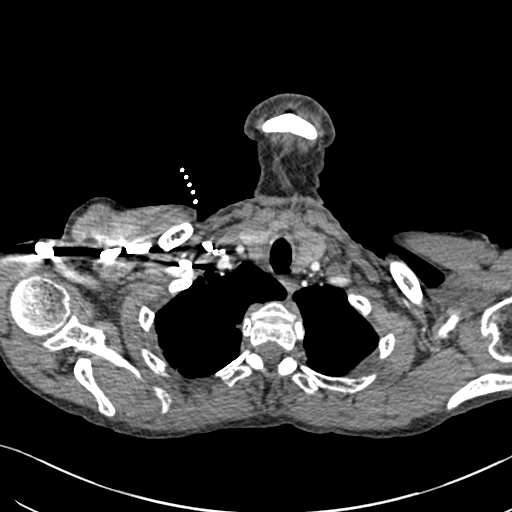
[im 316/331  lung]
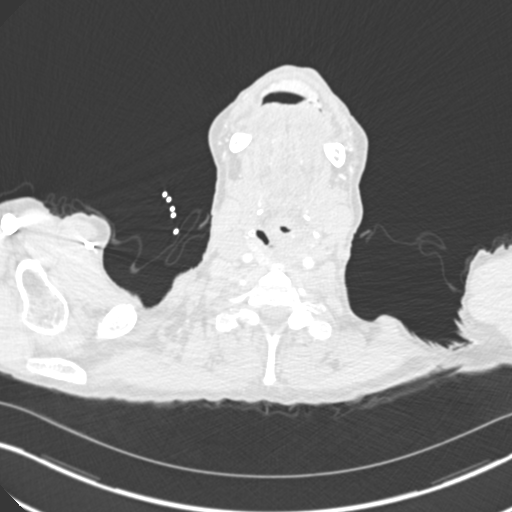

[Series 8: coronal mpr · coronal · 0.65mm/px · 3 of 125 slices shown]
[im 32/125  soft-tissue]
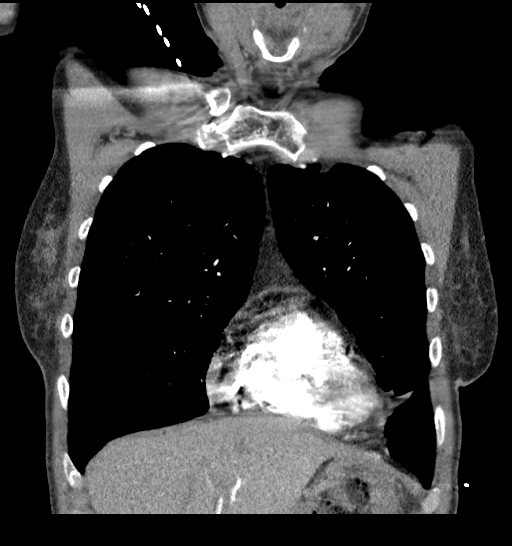
[im 63/125  soft-tissue]
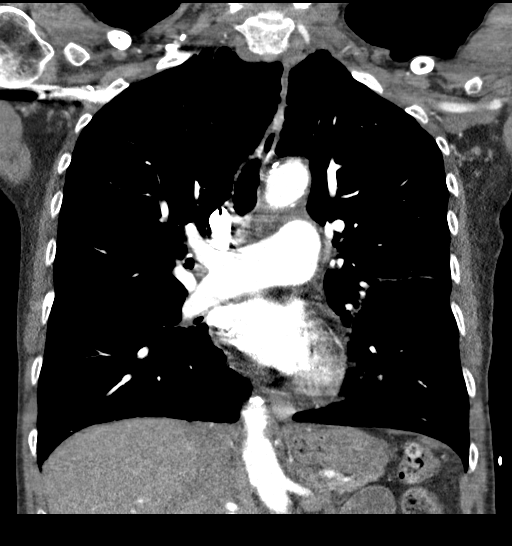
[im 94/125  soft-tissue]
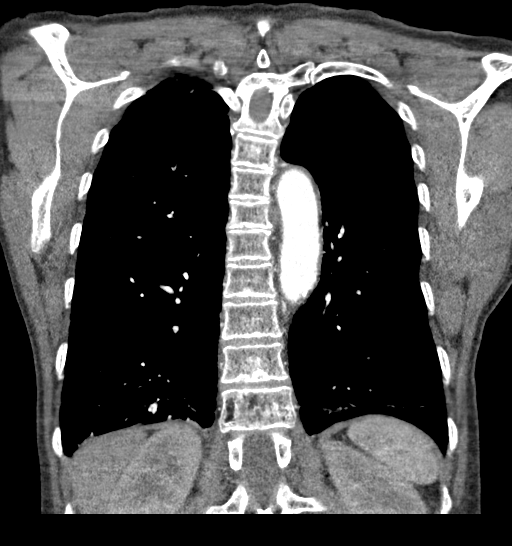

[18 of 46 positions shown; findings below may reference images not displayed]

FINDINGS: Cardiovascular: There is no demonstrable pulmonary embolus. There is
no thoracic aortic aneurysm or dissection. The visualized great
vessels appear unremarkable except for mild atherosclerotic
calcification the origin of the left subclavian artery. Note the
right left common carotid arteries arise common trunk, an anatomic
variant. There are scattered foci of atherosclerotic calcification
aorta. There are scattered foci of coronary artery calcification.
There is mild pericardial thickening.

Mediastinum/Nodes: Thyroid appears mildly inhomogeneous without
dominant mass evident. There is no appreciable thoracic adenopathy.

Lungs/Pleura: There is underlying bullous emphysematous change with
lower lobe primarily noted in the right upper lobe, stable. There is
scarring right middle lobe region, most likely at least in part due
to radiation therapy change. In the area of the previous mass in the
medial aspect of the right middle lobe, there is a persistent
opacity measuring 1.1 x 1.1 cm, compared to 2.7 x 1.8 cm on prior
study. Noted new mass or nodular lesion is evident. There is
scarring in the lingula as well as in the lung bases, stable. There
is no right edema or consolidation. No new opacity is evident. Note
that the previous right middle lobe consolidation has virtually
completely resolved.

Upper Abdomen: In the visualized upper abdomen, there is
atherosclerotic calcification in the aorta. Visualized upper
abdominal structures otherwise unremarkable.

Musculoskeletal: There is a hemangioma in the T12 vertebral body,
stable. There are no bony lesions suggesting metastatic disease.
Bones do appear somewhat osteoporotic.

Review of the MIP images confirms the above findings.
IMPRESSION: No demonstrable pulmonary embolus.

The previously noted right middle lobe central nodular lesion
currently measures 1.1 x 1.1 cm compared to 0.7 x 1.8 cm on study
from 2.5 months prior. Note new nodular lesion. Areas of scarring
and underlying bullous emphysematous change remain stable. There is
no longer appreciable consolidation in the right middle lobe
compared to prior study.

No evident adenopathy.

Areas of atherosclerotic calcification in the aorta and several
coronary arteries.

Mild pericardial thickening.

Suspect multinodular goiter without dominant thyroid mass. Stable
hemangioma in the T12 vertebral body.

## 2017-10-07 ENCOUNTER — Ambulatory Visit (HOSPITAL_COMMUNITY)
Admission: RE | Admit: 2017-10-07 | Discharge: 2017-10-07 | Disposition: A | Discharge status: Home / Self Care | Payer: Medicare HMO | Source: Ambulatory Visit | Attending: Nurse Practitioner | Admitting: Nurse Practitioner

## 2017-10-07 ENCOUNTER — Encounter (HOSPITAL_COMMUNITY): Payer: Self-pay

## 2017-10-07 DIAGNOSIS — Z1231 Encounter for screening mammogram for malignant neoplasm of breast: Secondary | ICD-10-CM | POA: Insufficient documentation

## 2017-10-12 ENCOUNTER — Ambulatory Visit (HOSPITAL_COMMUNITY)
Admission: RE | Admit: 2017-10-12 | Discharge: 2017-10-12 | Disposition: A | Discharge status: Home / Self Care | Payer: Medicare HMO | Source: Ambulatory Visit | Attending: Nurse Practitioner | Admitting: Nurse Practitioner

## 2017-10-12 DIAGNOSIS — J439 Emphysema, unspecified: Secondary | ICD-10-CM | POA: Diagnosis not present

## 2017-10-12 DIAGNOSIS — C3491 Malignant neoplasm of unspecified part of right bronchus or lung: Secondary | ICD-10-CM | POA: Insufficient documentation

## 2017-10-12 DIAGNOSIS — I700 Atherosclerosis of aorta: Secondary | ICD-10-CM | POA: Diagnosis not present

## 2017-10-12 MED ORDER — IOHEXOL 300 MG/ML  SOLN
75.0000 mL | Freq: Once | INTRAMUSCULAR | Status: AC | PRN
  Administered 2017-10-12: 75 mL via INTRAVENOUS

## 2017-10-14 ENCOUNTER — Telehealth: Payer: Self-pay | Admitting: Internal Medicine

## 2017-10-14 ENCOUNTER — Encounter: Payer: Self-pay | Admitting: Internal Medicine

## 2017-10-14 ENCOUNTER — Inpatient Hospital Stay (HOSPITAL_BASED_OUTPATIENT_CLINIC_OR_DEPARTMENT_OTHER): Payer: Medicare HMO | Admitting: Internal Medicine

## 2017-10-14 ENCOUNTER — Inpatient Hospital Stay: Payer: Medicare HMO | Attending: Oncology

## 2017-10-14 ENCOUNTER — Inpatient Hospital Stay: Payer: Medicare HMO

## 2017-10-14 VITALS — BP 105/75 | HR 110 | Temp 98.4°F | Resp 18 | Ht 67.0 in | Wt 136.6 lb

## 2017-10-14 DIAGNOSIS — Z79899 Other long term (current) drug therapy: Secondary | ICD-10-CM | POA: Diagnosis not present

## 2017-10-14 DIAGNOSIS — H539 Unspecified visual disturbance: Secondary | ICD-10-CM | POA: Diagnosis not present

## 2017-10-14 DIAGNOSIS — Z5112 Encounter for antineoplastic immunotherapy: Secondary | ICD-10-CM | POA: Diagnosis not present

## 2017-10-14 DIAGNOSIS — C342 Malignant neoplasm of middle lobe, bronchus or lung: Secondary | ICD-10-CM | POA: Insufficient documentation

## 2017-10-14 DIAGNOSIS — R51 Headache: Secondary | ICD-10-CM

## 2017-10-14 DIAGNOSIS — C3491 Malignant neoplasm of unspecified part of right bronchus or lung: Secondary | ICD-10-CM

## 2017-10-14 DIAGNOSIS — I10 Essential (primary) hypertension: Secondary | ICD-10-CM

## 2017-10-14 LAB — CMP (CANCER CENTER ONLY)
ALT: 16 U/L (ref 0–44)
ANION GAP: 9 (ref 5–15)
AST: 17 U/L (ref 15–41)
Albumin: 3.8 g/dL (ref 3.5–5.0)
Alkaline Phosphatase: 249 U/L — ABNORMAL HIGH (ref 38–126)
BUN: 16 mg/dL (ref 8–23)
CHLORIDE: 106 mmol/L (ref 98–111)
CO2: 28 mmol/L (ref 22–32)
Calcium: 9.8 mg/dL (ref 8.9–10.3)
Creatinine: 0.76 mg/dL (ref 0.44–1.00)
Glucose, Bld: 50 mg/dL — ABNORMAL LOW (ref 70–99)
POTASSIUM: 4 mmol/L (ref 3.5–5.1)
Sodium: 143 mmol/L (ref 135–145)
TOTAL PROTEIN: 7.2 g/dL (ref 6.5–8.1)

## 2017-10-14 LAB — CBC WITH DIFFERENTIAL (CANCER CENTER ONLY)
BASOS ABS: 0 10*3/uL (ref 0.0–0.1)
Basophils Relative: 0 %
Eosinophils Absolute: 0.1 10*3/uL (ref 0.0–0.5)
Eosinophils Relative: 1 %
HCT: 40.4 % (ref 34.8–46.6)
HEMOGLOBIN: 13.9 g/dL (ref 11.6–15.9)
LYMPHS ABS: 2 10*3/uL (ref 0.9–3.3)
LYMPHS PCT: 23 %
MCH: 28.7 pg (ref 25.1–34.0)
MCHC: 34.4 g/dL (ref 31.5–36.0)
MCV: 83.5 fL (ref 79.5–101.0)
Monocytes Absolute: 0.5 10*3/uL (ref 0.1–0.9)
Monocytes Relative: 5 %
NEUTROS PCT: 71 %
Neutro Abs: 6.3 10*3/uL (ref 1.5–6.5)
Platelet Count: 276 10*3/uL (ref 145–400)
RBC: 4.84 MIL/uL (ref 3.70–5.45)
RDW: 17.3 % — ABNORMAL HIGH (ref 11.2–14.5)
WBC: 8.9 10*3/uL (ref 3.9–10.3)

## 2017-10-14 LAB — TSH: TSH: 2.327 u[IU]/mL (ref 0.308–3.960)

## 2017-10-14 MED ORDER — SODIUM CHLORIDE 0.9 % IV SOLN
Freq: Once | INTRAVENOUS | Status: AC
  Administered 2017-10-14: 13:00:00 via INTRAVENOUS

## 2017-10-14 MED ORDER — SODIUM CHLORIDE 0.9 % IV SOLN
480.0000 mg | Freq: Once | INTRAVENOUS | Status: AC
  Administered 2017-10-14: 480 mg via INTRAVENOUS
  Filled 2017-10-14: qty 48

## 2017-10-14 NOTE — Telephone Encounter (Signed)
Scheduled appt per 7/3 los - gave patient aVS and calender per los.

## 2017-10-14 NOTE — Progress Notes (Signed)
[place] [phone]   [phone]  OFFICE PROGRESS NOTE  Barry Dienes, NP [place] [address]  DIAGNOSIS: Stage IIIA (T1b, N2, M0) non-small cell lung cancer presented with right middle lobe pulmonary nodule, mediastinal lymphadenopathy and highly suspicious for small nodule in the left upper lobe that could change her stage to stage IV that could present another synchronous primary lesion in the left upper lobe. This was diagnosed in September 2017.  PRIOR THERAPY:  1) Concurrent chemoradiation with weekly carboplatin for AUC of 2 and paclitaxel 45 MG/M2 status post 6 cycles last dose was given 02/25/2016 with partial response. 2) Consolidation chemotherapy with carboplatin for AUC of 5 and paclitaxel 175 MG/M2 every 3 weeks with Neulasta support. First dose 04/22/2016. Status post 3 cycles.  CURRENT THERAPY: Second line treatment with immunotherapy with Nivolumab 480 mg IV every 4 weeks status post 6 cycles..  INTERVAL HISTORY: Courtney Zuniga 67 y.o. female returns to the clinic today for follow-up visit accompanied by a family member.  The patient is feeling fine today with no concerning complaints.  She denied having any chest pain, shortness breath, cough or hemoptysis.  She denied having any fever or chills.  She has no nausea, vomiting, diarrhea or constipation.  She continues to have headache and she is using a lot of BC powder.  She had MRI of the brain performed at the end of January 2019 for the same complaint and it was unremarkable for any metastatic disease to the brain.  The patient had repeat CT scan of the chest performed recently and she is here for evaluation and discussion of her risk her results.  MEDICAL HISTORY: Past Medical History:  Diagnosis Date  . Anemia    as a young woman  . Arthritis    osteoartritis  . Asthma   . Brain tumor (benign) ([place]) 2005 [facility]   Benign  . Chronic headaches   . Chronic hip  pain   . Chronic pain   . COPD (chronic obstructive pulmonary disease) ([place])   . Coronary artery disease   . Depression   . Depression 05/15/2016  . Encounter for antineoplastic chemotherapy 01/10/2016  . GERD (gastroesophageal reflux disease)   . Hypertension   . Lung cancer ([place]) dx'd 01/2016   radiation and chemo done, none now  . NSTEMI (non-ST elevated myocardial infarction) ([place]) yrs ago  . On home O2    qhs 2 liters at hs and prn  . Pneumonia last time 2 yrs ago  . Shortness of breath dyspnea    with activity    ALLERGIES:  has No Known Allergies.  MEDICATIONS:  Current Outpatient Medications  Medication Sig Dispense Refill  . albuterol (PROVENTIL HFA;VENTOLIN HFA) 108 (90 BASE) MCG/ACT inhaler Inhale 2 puffs into the lungs every 4 (four) hours as needed for shortness of breath.     Marland Kitchen albuterol (PROVENTIL) (2.5 MG/3ML) 0.083% nebulizer solution Take 2.5 mg by nebulization every 6 (six) hours as needed for wheezing or shortness of breath.    . Aspirin-Salicylamide-Caffeine (BC HEADACHE POWDER PO) Take 1 packet by mouth 2 (two) times daily as needed (headache).     Marland Kitchen BREO ELLIPTA 100-25 MCG/INH AEPB Inhale 1 puff into the lungs daily as needed.    . Calcium Carb-Cholecalciferol (CALCIUM/VITAMIN D PO) Take 1 tablet by mouth daily.    . cyclobenzaprine (FLEXERIL) 10 MG tablet Take 10 mg by mouth 3 (three) times daily.     Marland Kitchen  diclofenac sodium (VOLTAREN) 1 % GEL APPLY AND GENTLY MASSAGE GEL INTO THE AFFECTED AREA(S) UP TO 4 TIMES DAILY, AS DIRECTED  1  . DULoxetine (CYMBALTA) 30 MG capsule Take by mouth.    . Fluticasone-Salmeterol (ADVAIR) 500-50 MCG/DOSE AEPB Inhale 1 puff into the lungs daily.     Marland Kitchen guaifenesin (ROBITUSSIN) 100 MG/5ML syrup Take 200 mg by mouth daily as needed for cough.    . lidocaine-prilocaine (EMLA) cream APPLY 2 GRAMS TO AFFECTED AREA(S) UP TO 4 TIMES DAILY  2  . meloxicam (MOBIC) 15 MG tablet Take 15 mg by mouth daily.    . metoprolol tartrate (LOPRESSOR)  25 MG tablet Take 1 tablet by mouth 2 (two) times daily.    . mirtazapine (REMERON) 15 MG tablet Take 1 tablet (15 mg total) by mouth at bedtime. 30 tablet 0  . omega-3 acid ethyl esters (LOVAZA) 1 g capsule Take 2 capsules by mouth 2 (two) times daily.  1  . omeprazole (PRILOSEC) 20 MG capsule Take 20 mg by mouth daily.    Marland Kitchen OVER THE COUNTER MEDICATION Take 1 packet by mouth at bedtime as needed (sleep). BC powder pm    . prochlorperazine (COMPAZINE) 10 MG tablet TAKE 1 TABLET BY MOUTH EVERY 6 HOURS AS NEEDED FOR NAUSEA & VOMITING (Patient not taking: Reported on 06/25/2017) 30 tablet 0  . Pseudoeph-Doxylamine-DM-APAP (NYQUIL PO) Take 1 Dose by mouth at bedtime as needed (cough).    . sucralfate (CARAFATE) 1 g tablet Take 1 tablet (1 g total) by mouth 4 (four) times daily. (Patient taking differently: Take 1 g by mouth 2 (two) times daily. ) 12 tablet 0   No current facility-administered medications for this visit.     SURGICAL HISTORY:  Past Surgical History:  Procedure Laterality Date  . CHOLECYSTECTOMY    . COLONOSCOPY  2015   Results requested from [facility]  . COLONOSCOPY    . ESOPHAGOGASTRODUODENOSCOPY N/A 08/14/2015   Procedure: ESOPHAGOGASTRODUODENOSCOPY (EGD);  Surgeon: Daneil Dolin, MD;  [facility];  Service: Endoscopy;  Laterality: N/A;  215   . ESOPHAGOGASTRODUODENOSCOPY (EGD) WITH PROPOFOL N/A 09/13/2015   Procedure: ESOPHAGOGASTRODUODENOSCOPY (EGD) WITH PROPOFOL;  Surgeon: Milus Banister, MD;  Location: [facility];  Service: Endoscopy;  Laterality: N/A;  . EUS N/A 03/12/2017   Procedure: UPPER ENDOSCOPIC ULTRASOUND (EUS) RADIAL;  Surgeon: Milus Banister, MD;  Location: [facility];  Service: Endoscopy;  Laterality: N/A;  . TUMOR REMOVAL  2005   Benign  . UPPER ESOPHAGEAL ENDOSCOPIC ULTRASOUND (EUS)  09/13/2015   Procedure: UPPER ESOPHAGEAL ENDOSCOPIC ULTRASOUND (EUS);  Surgeon: Milus Banister, MD;  Location: [facility];  Service: Endoscopy;;    . VIDEO BRONCHOSCOPY WITH ENDOBRONCHIAL NAVIGATION N/A 12/31/2015   Procedure: VIDEO BRONCHOSCOPY WITH ENDOBRONCHIAL NAVIGATION;  Surgeon: Melrose Nakayama, MD;  Location: [facility];  Service: Thoracic;  Laterality: N/A;  . VIDEO BRONCHOSCOPY WITH ENDOBRONCHIAL ULTRASOUND N/A 11/08/2015   Procedure: VIDEO BRONCHOSCOPY WITH ENDOBRONCHIAL ULTRASOUND;  Surgeon: Ivin Poot, MD;  Location: [facility];  Service: Thoracic;  Laterality: N/A;    REVIEW OF SYSTEMS:  Constitutional: negative Eyes: negative Ears, nose, mouth, throat, and face: negative Respiratory: negative Cardiovascular: negative Gastrointestinal: negative Genitourinary:negative Integument/breast: negative Hematologic/lymphatic: negative Musculoskeletal:negative Neurological: positive for headaches Behavioral/Psych: negative Endocrine: negative Allergic/Immunologic: negative   PHYSICAL EXAMINATION: General appearance: alert, cooperative and no distress Head: Normocephalic, without obvious abnormality, atraumatic Neck: no adenopathy, no JVD, supple, symmetrical, trachea midline and thyroid not enlarged, symmetric, no tenderness/mass/nodules  Lymph nodes: Cervical, supraclavicular, and axillary nodes normal. Resp: clear to auscultation bilaterally Back: symmetric, no curvature. ROM normal. No CVA tenderness. Cardio: regular rate and rhythm, S1, S2 normal, no murmur, click, rub or gallop GI: soft, non-tender; bowel sounds normal; no masses,  no organomegaly Extremities: extremities normal, atraumatic, no cyanosis or edema Neurologic: Alert and oriented X 3, normal strength and tone. Normal symmetric reflexes. Normal coordination and gait  ECOG PERFORMANCE STATUS: 1 - Symptomatic but completely ambulatory  Blood pressure 105/75, pulse (!) 110, temperature 98.4 F (36.9 C), temperature source Oral, resp. rate 18, height 5\' 7"  (1.702 m), weight 136 lb 9.6 oz (62 kg), SpO2 98 %.  LABORATORY DATA: Lab Results  Component Value  Date   WBC 8.9 10/14/2017   HGB 13.9 10/14/2017   HCT 40.4 10/14/2017   MCV 83.5 10/14/2017   PLT 276 10/14/2017      Chemistry      Component Value Date/Time   NA 143 10/14/2017 0935   NA 144 04/09/2017 0948   K 4.0 10/14/2017 0935   K 4.3 04/09/2017 0948   CL 106 10/14/2017 0935   CO2 28 10/14/2017 0935   CO2 25 04/09/2017 0948   BUN 16 10/14/2017 0935   BUN 16.8 04/09/2017 0948   CREATININE 0.76 10/14/2017 0935   CREATININE 0.8 04/09/2017 0948      Component Value Date/Time   CALCIUM 9.8 10/14/2017 0935   CALCIUM 9.1 04/09/2017 0948   ALKPHOS 249 (H) 10/14/2017 0935   ALKPHOS 216 (H) 04/09/2017 0948   AST 17 10/14/2017 0935   AST 12 04/09/2017 0948   ALT 16 10/14/2017 0935   ALT 13 04/09/2017 0948   BILITOT <0.2 (L) 10/14/2017 0935   BILITOT 0.22 04/09/2017 0948       RADIOGRAPHIC STUDIES: Ct Chest W Contrast  Result Date: 10/12/2017 CLINICAL DATA:  Stage III lung cancer. EXAM: CT CHEST WITH CONTRAST TECHNIQUE: Multidetector CT imaging of the chest was performed during intravenous contrast administration. CONTRAST:  54mL OMNIPAQUE IOHEXOL 300 MG/ML  SOLN COMPARISON:  07/21/2017 FINDINGS: Cardiovascular: The heart size is normal. No substantial pericardial effusion. Coronary artery calcification is evident. Atherosclerotic calcification is noted in the wall of the thoracic aorta. Mediastinum/Nodes: No mediastinal lymphadenopathy. There is no hilar lymphadenopathy. The esophagus has normal imaging features. There is no axillary lymphadenopathy. Lungs/Pleura: The central tracheobronchial airways are patent. Centrilobular and paraseptal emphysema is associated with bullous change in the upper lobes, right greater than left. Scattered areas of architectural distortion/scarring are evident bilaterally. Stable appearance right middle collapse. Similar appearance of the residua from previous 8 mm left upper lobe pulmonary nodule again noted central left upper lobe (64:5). Posterior  right upper lobe pulmonary nodule has nearly resolved in the interval (81:5) and the irregular right lower lobe nodule has decreased in the interval measuring 5 mm today (85:5) compared to 7 mm previously. Upper Abdomen: Unremarkable. Musculoskeletal: No worrisome lytic or sclerotic osseous abnormality. IMPRESSION: 1. Stable exam.  No new or progressive interval findings. 2.  Emphysema. (ICD10-J43.9) 3.  Aortic Atherosclerois (ICD10-170.0) 4. Electronically Signed   By: Misty Stanley M.D.   On: 10/12/2017 12:22   Mm Digital Screening Bilateral  Result Date: 10/07/2017 CLINICAL DATA:  Screening. EXAM: DIGITAL SCREENING BILATERAL MAMMOGRAM WITH CAD COMPARISON:  Previous exam(s). ACR Breast Density Category b: There are scattered areas of fibroglandular density. FINDINGS: There are no findings suspicious for malignancy. Images were processed with CAD. IMPRESSION: No mammographic evidence of malignancy. A result letter  of this screening mammogram will be mailed directly to the patient. RECOMMENDATION: Screening mammogram in one year. (Code:SM-B-01Y) BI-RADS CATEGORY  1: Negative. Electronically Signed   By: Trude Mcburney M.D.   On: 10/07/2017 15:16    ASSESSMENT AND PLAN:  This is a very pleasant 67 years old African-American female with a stage IIIA non-small cell lung cancer status post concurrent chemoradiation followed by consolidation chemotherapy with carboplatin and paclitaxel for 3 cycles. The patient was followed by observation but restaging imaging studies showed evidence for disease progression. She was a started on second line treatment with immunotherapy with Nivolumab 480 mg IV every 4 weeks status post 6 cycles.   The patient is doing fine today with no concerning complaints except for the persistent headache that has been going on for several months. She is tolerating her treatment with immunotherapy fairly well. Repeat CT scan of the chest showed no concerning findings for disease  progression. I personally and independently reviewed the scans and discussed the results with the patient today.  I recommended for her to continue her current treatment with Nivolumab and she will start cycle #7 today. For the headache she has visual disturbance and she was advised to see her ophthalmologist for evaluation.  Her previous MRI of the brain was unremarkable for any abnormality.  She was also advised not to take too much BC powder. She was advised to call immediately if she has any concerning symptoms in the interval. The patient voices understanding of current disease status and treatment options and is in agreement with the current care plan. All questions were answered. The patient knows to call the clinic with any problems, questions or concerns. We can certainly see the patient much sooner if necessary.  Disclaimer: This note was dictated with voice recognition software. Similar sounding words can inadvertently be transcribed and may not be corrected upon review.

## 2017-10-14 NOTE — Patient Instructions (Signed)
[facility] Discharge Instructions for Patients Receiving Chemotherapy  Today you received the following chemotherapy agents Opdivo  To help prevent nausea and vomiting after your treatment, we encourage you to take your nausea medication as directed   If you develop nausea and vomiting that is not controlled by your nausea medication, call the clinic.   BELOW ARE SYMPTOMS THAT SHOULD BE REPORTED IMMEDIATELY:  *FEVER GREATER THAN 100.5 F  *CHILLS WITH OR WITHOUT FEVER  NAUSEA AND VOMITING THAT IS NOT CONTROLLED WITH YOUR NAUSEA MEDICATION  *UNUSUAL SHORTNESS OF BREATH  *UNUSUAL BRUISING OR BLEEDING  TENDERNESS IN MOUTH AND THROAT WITH OR WITHOUT PRESENCE OF ULCERS  *URINARY PROBLEMS  *BOWEL PROBLEMS  UNUSUAL RASH Items with * indicate a potential emergency and should be followed up as soon as possible.  Feel free to call the clinic should you have any questions or concerns. The clinic phone number is [phone].  Please show the CHEMO ALERT CARD at check-in to the Emergency Department and triage nurse.   

## 2017-11-06 ENCOUNTER — Telehealth: Payer: Self-pay | Admitting: Internal Medicine

## 2017-11-06 NOTE — Telephone Encounter (Signed)
Called regarding 8/29

## 2017-11-12 ENCOUNTER — Inpatient Hospital Stay: Payer: Medicare HMO | Attending: Oncology | Admitting: Internal Medicine

## 2017-11-12 ENCOUNTER — Telehealth: Payer: Self-pay | Admitting: Internal Medicine

## 2017-11-12 ENCOUNTER — Inpatient Hospital Stay: Payer: Medicare HMO

## 2017-11-12 ENCOUNTER — Encounter: Payer: Self-pay | Admitting: Internal Medicine

## 2017-11-12 VITALS — BP 134/80 | HR 109 | Temp 98.5°F | Resp 19 | Ht 67.0 in | Wt 137.9 lb

## 2017-11-12 VITALS — HR 94

## 2017-11-12 DIAGNOSIS — R079 Chest pain, unspecified: Secondary | ICD-10-CM | POA: Insufficient documentation

## 2017-11-12 DIAGNOSIS — K219 Gastro-esophageal reflux disease without esophagitis: Secondary | ICD-10-CM | POA: Insufficient documentation

## 2017-11-12 DIAGNOSIS — C342 Malignant neoplasm of middle lobe, bronchus or lung: Secondary | ICD-10-CM | POA: Diagnosis not present

## 2017-11-12 DIAGNOSIS — Z5112 Encounter for antineoplastic immunotherapy: Secondary | ICD-10-CM

## 2017-11-12 DIAGNOSIS — R0602 Shortness of breath: Secondary | ICD-10-CM | POA: Diagnosis not present

## 2017-11-12 DIAGNOSIS — R05 Cough: Secondary | ICD-10-CM | POA: Insufficient documentation

## 2017-11-12 DIAGNOSIS — C3491 Malignant neoplasm of unspecified part of right bronchus or lung: Secondary | ICD-10-CM

## 2017-11-12 DIAGNOSIS — I10 Essential (primary) hypertension: Secondary | ICD-10-CM

## 2017-11-12 DIAGNOSIS — J189 Pneumonia, unspecified organism: Secondary | ICD-10-CM | POA: Diagnosis not present

## 2017-11-12 DIAGNOSIS — Z79899 Other long term (current) drug therapy: Secondary | ICD-10-CM | POA: Diagnosis not present

## 2017-11-12 LAB — CBC WITH DIFFERENTIAL (CANCER CENTER ONLY)
Basophils Absolute: 0 10*3/uL (ref 0.0–0.1)
Basophils Relative: 0 %
EOS ABS: 0.1 10*3/uL (ref 0.0–0.5)
Eosinophils Relative: 2 %
HCT: 41.5 % (ref 34.8–46.6)
Hemoglobin: 14.5 g/dL (ref 11.6–15.9)
Lymphocytes Relative: 31 %
Lymphs Abs: 1.7 10*3/uL (ref 0.9–3.3)
MCH: 29.7 pg (ref 25.1–34.0)
MCHC: 34.9 g/dL (ref 31.5–36.0)
MCV: 84.9 fL (ref 79.5–101.0)
MONOS PCT: 9 %
Monocytes Absolute: 0.5 10*3/uL (ref 0.1–0.9)
Neutro Abs: 3.2 10*3/uL (ref 1.5–6.5)
Neutrophils Relative %: 58 %
Platelet Count: 214 10*3/uL (ref 145–400)
RBC: 4.89 MIL/uL (ref 3.70–5.45)
RDW: 16.4 % — ABNORMAL HIGH (ref 11.2–14.5)
WBC Count: 5.4 10*3/uL (ref 3.9–10.3)

## 2017-11-12 LAB — CMP (CANCER CENTER ONLY)
ALK PHOS: 246 U/L — AB (ref 38–126)
ALT: 12 U/L (ref 0–44)
AST: 18 U/L (ref 15–41)
Albumin: 3.8 g/dL (ref 3.5–5.0)
Anion gap: 10 (ref 5–15)
BUN: 21 mg/dL (ref 8–23)
CALCIUM: 9.8 mg/dL (ref 8.9–10.3)
CO2: 28 mmol/L (ref 22–32)
CREATININE: 0.73 mg/dL (ref 0.44–1.00)
Chloride: 104 mmol/L (ref 98–111)
GFR, Est AFR Am: >60 mL/min (ref >60)
GFR, Estimated: >60 mL/min (ref >60)
Glucose, Bld: 49 mg/dL — ABNORMAL LOW (ref 70–99)
Potassium: 4.1 mmol/L (ref 3.5–5.1)
Sodium: 142 mmol/L (ref 135–145)
Total Bilirubin: 0.2 mg/dL — ABNORMAL LOW (ref 0.3–1.2)
Total Protein: 7.2 g/dL (ref 6.5–8.1)

## 2017-11-12 LAB — TSH: TSH: 2.821 u[IU]/mL (ref 0.308–3.960)

## 2017-11-12 MED ORDER — SODIUM CHLORIDE 0.9 % IV SOLN
480.0000 mg | Freq: Once | INTRAVENOUS | Status: AC
  Administered 2017-11-12: 480 mg via INTRAVENOUS
  Filled 2017-11-12: qty 48

## 2017-11-12 MED ORDER — SODIUM CHLORIDE 0.9 % IV SOLN
Freq: Once | INTRAVENOUS | Status: AC
Start: 2017-11-12 — End: 2017-11-12
  Administered 2017-11-12: 11:00:00 via INTRAVENOUS
  Filled 2017-11-12: qty 250

## 2017-11-12 NOTE — Telephone Encounter (Signed)
Scheduled appt per 8/1 los - gave patient AVS and calender per los.

## 2017-11-12 NOTE — Patient Instructions (Signed)
[facility] Discharge Instructions for Patients Receiving Chemotherapy  Today you received the following chemotherapy agents Opdivo  To help prevent nausea and vomiting after your treatment, we encourage you to take your nausea medication as directed   If you develop nausea and vomiting that is not controlled by your nausea medication, call the clinic.   BELOW ARE SYMPTOMS THAT SHOULD BE REPORTED IMMEDIATELY:  *FEVER GREATER THAN 100.5 F  *CHILLS WITH OR WITHOUT FEVER  NAUSEA AND VOMITING THAT IS NOT CONTROLLED WITH YOUR NAUSEA MEDICATION  *UNUSUAL SHORTNESS OF BREATH  *UNUSUAL BRUISING OR BLEEDING  TENDERNESS IN MOUTH AND THROAT WITH OR WITHOUT PRESENCE OF ULCERS  *URINARY PROBLEMS  *BOWEL PROBLEMS  UNUSUAL RASH Items with * indicate a potential emergency and should be followed up as soon as possible.  Feel free to call the clinic should you have any questions or concerns. The clinic phone number is [phone].  Please show the CHEMO ALERT CARD at check-in to the Emergency Department and triage nurse.   

## 2017-11-12 NOTE — Progress Notes (Signed)
[place] [phone]   [phone]  OFFICE PROGRESS NOTE  Barry Dienes, NP [place] [address]  DIAGNOSIS: Stage IIIA (T1b, N2, M0) non-small cell lung cancer presented with right middle lobe pulmonary nodule, mediastinal lymphadenopathy and highly suspicious for small nodule in the left upper lobe that could change her stage to stage IV that could present another synchronous primary lesion in the left upper lobe. This was diagnosed in September 2017.  PRIOR THERAPY:  1) Concurrent chemoradiation with weekly carboplatin for AUC of 2 and paclitaxel 45 MG/M2 status post 6 cycles last dose was given 02/25/2016 with partial response. 2) Consolidation chemotherapy with carboplatin for AUC of 5 and paclitaxel 175 MG/M2 every 3 weeks with Neulasta support. First dose 04/22/2016. Status post 3 cycles.  CURRENT THERAPY: Second line treatment with immunotherapy with Nivolumab 480 mg IV every 4 weeks status post 7 cycles..  INTERVAL HISTORY: Courtney Zuniga 67 y.o. female returns to the clinic today for follow-up visit.  The patient is feeling fine today with no specific complaints except for occasional shortness of breath and heartburn.  She denied having any chest pain, cough or hemoptysis.  She denied having any fever or chills.  She has no nausea, vomiting, diarrhea or constipation.  She denied having any recent weight loss or night sweats.  She continues to tolerate her treatment with Nivolumab fairly well.  She is here today for evaluation before starting cycle #8.   MEDICAL HISTORY: Past Medical History:  Diagnosis Date  . Anemia    as a young woman  . Arthritis    osteoartritis  . Asthma   . Brain tumor (benign) ([place]) 2005 [facility]   Benign  . Chronic headaches   . Chronic hip pain   . Chronic pain   . COPD (chronic obstructive pulmonary disease) ([place])   . Coronary artery disease   . Depression   . Depression 05/15/2016  .  Encounter for antineoplastic chemotherapy 01/10/2016  . GERD (gastroesophageal reflux disease)   . Hypertension   . Lung cancer ([place]) dx'd 01/2016   radiation and chemo done, none now  . NSTEMI (non-ST elevated myocardial infarction) ([place]) yrs ago  . On home O2    qhs 2 liters at hs and prn  . Pneumonia last time 2 yrs ago  . Shortness of breath dyspnea    with activity    ALLERGIES:  has No Known Allergies.  MEDICATIONS:  Current Outpatient Medications  Medication Sig Dispense Refill  . albuterol (PROVENTIL HFA;VENTOLIN HFA) 108 (90 BASE) MCG/ACT inhaler Inhale 2 puffs into the lungs every 4 (four) hours as needed for shortness of breath.     Marland Kitchen albuterol (PROVENTIL) (2.5 MG/3ML) 0.083% nebulizer solution Take 2.5 mg by nebulization every 6 (six) hours as needed for wheezing or shortness of breath.    . Aspirin-Salicylamide-Caffeine (BC HEADACHE POWDER PO) Take 1 packet by mouth 2 (two) times daily as needed (headache).     Marland Kitchen BREO ELLIPTA 100-25 MCG/INH AEPB Inhale 1 puff into the lungs daily as needed.    . Calcium Carb-Cholecalciferol (CALCIUM/VITAMIN D PO) Take 1 tablet by mouth daily.    . cyclobenzaprine (FLEXERIL) 10 MG tablet Take 10 mg by mouth 3 (three) times daily.     . diclofenac sodium (VOLTAREN) 1 % GEL APPLY AND GENTLY MASSAGE GEL INTO THE AFFECTED AREA(S) UP TO 4 TIMES DAILY, AS DIRECTED  1  . DULoxetine (CYMBALTA) 30 MG capsule  Take by mouth.    . Fluticasone-Salmeterol (ADVAIR) 500-50 MCG/DOSE AEPB Inhale 1 puff into the lungs daily.     Marland Kitchen guaifenesin (ROBITUSSIN) 100 MG/5ML syrup Take 200 mg by mouth daily as needed for cough.    . lidocaine-prilocaine (EMLA) cream APPLY 2 GRAMS TO AFFECTED AREA(S) UP TO 4 TIMES DAILY  2  . meloxicam (MOBIC) 15 MG tablet Take 15 mg by mouth daily.    . metoprolol tartrate (LOPRESSOR) 25 MG tablet Take 1 tablet by mouth 2 (two) times daily.    . mirtazapine (REMERON) 15 MG tablet Take 1 tablet (15 mg total) by mouth at bedtime. 30  tablet 0  . omega-3 acid ethyl esters (LOVAZA) 1 g capsule Take 2 capsules by mouth 2 (two) times daily.  1  . omeprazole (PRILOSEC) 20 MG capsule Take 20 mg by mouth daily.    Marland Kitchen omeprazole (PRILOSEC) 40 MG capsule   1  . OVER THE COUNTER MEDICATION Take 1 packet by mouth at bedtime as needed (sleep). BC powder pm    . prochlorperazine (COMPAZINE) 10 MG tablet TAKE 1 TABLET BY MOUTH EVERY 6 HOURS AS NEEDED FOR NAUSEA & VOMITING 30 tablet 0  . Pseudoeph-Doxylamine-DM-APAP (NYQUIL PO) Take 1 Dose by mouth at bedtime as needed (cough).    . sucralfate (CARAFATE) 1 g tablet Take 1 tablet (1 g total) by mouth 4 (four) times daily. (Patient taking differently: Take 1 g by mouth 2 (two) times daily. ) 12 tablet 0   No current facility-administered medications for this visit.     SURGICAL HISTORY:  Past Surgical History:  Procedure Laterality Date  . CHOLECYSTECTOMY    . COLONOSCOPY  2015   Results [facility] from [facility]  . COLONOSCOPY    . ESOPHAGOGASTRODUODENOSCOPY N/A 08/14/2015   Procedure: ESOPHAGOGASTRODUODENOSCOPY (EGD);  Surgeon: Daneil Dolin, MD;  [facility];  Service: Endoscopy;  Laterality: N/A;  215   . ESOPHAGOGASTRODUODENOSCOPY (EGD) WITH PROPOFOL N/A 09/13/2015   Procedure: ESOPHAGOGASTRODUODENOSCOPY (EGD) WITH PROPOFOL;  Surgeon: Milus Banister, MD;  [facility];  Service: Endoscopy;  Laterality: N/A;  . EUS N/A 03/12/2017   Procedure: UPPER ENDOSCOPIC ULTRASOUND (EUS) RADIAL;  Surgeon: Milus Banister, MD;  [facility];  Service: Endoscopy;  Laterality: N/A;  . TUMOR REMOVAL  2005   Benign  . UPPER ESOPHAGEAL ENDOSCOPIC ULTRASOUND (EUS)  09/13/2015   Procedure: UPPER ESOPHAGEAL ENDOSCOPIC ULTRASOUND (EUS);  Surgeon: Milus Banister, MD;  Location: [facility];  Service: Endoscopy;;  . VIDEO BRONCHOSCOPY WITH ENDOBRONCHIAL NAVIGATION N/A 12/31/2015   Procedure: VIDEO BRONCHOSCOPY WITH ENDOBRONCHIAL NAVIGATION;  Surgeon: Melrose Nakayama, MD;  Location: [place];  Service: Thoracic;  Laterality: N/A;  . VIDEO BRONCHOSCOPY WITH ENDOBRONCHIAL ULTRASOUND N/A 11/08/2015   Procedure: VIDEO BRONCHOSCOPY WITH ENDOBRONCHIAL ULTRASOUND;  Surgeon: Ivin Poot, MD;  [facility];  Service: Thoracic;  Laterality: N/A;    REVIEW OF SYSTEMS:  A comprehensive review of systems was negative except for: Respiratory: positive for dyspnea on exertion Gastrointestinal: positive for reflux symptoms   PHYSICAL EXAMINATION: General appearance: alert, cooperative and no distress Head: Normocephalic, without obvious abnormality, atraumatic Neck: no adenopathy, no JVD, supple, symmetrical, trachea midline and thyroid not enlarged, symmetric, no tenderness/mass/nodules Lymph nodes: Cervical, supraclavicular, and axillary nodes normal. Resp: clear to auscultation bilaterally Back: symmetric, no curvature. ROM normal. No CVA tenderness. Cardio: regular rate and rhythm, S1, S2 normal, no murmur, click, rub or gallop GI: soft, non-tender; bowel sounds  normal; no masses,  no organomegaly Extremities: extremities normal, atraumatic, no cyanosis or edema  ECOG PERFORMANCE STATUS: 1 - Symptomatic but completely ambulatory  Blood pressure 134/80, pulse (!) 109, temperature 98.5 F (36.9 C), temperature source Oral, resp. rate 19, height 5\' 7"  (1.702 m), weight 137 lb 14.4 oz (62.6 kg), SpO2 99 %.  LABORATORY DATA: Lab Results  Component Value Date   WBC 5.4 11/12/2017   HGB 14.5 11/12/2017   HCT 41.5 11/12/2017   MCV 84.9 11/12/2017   PLT 214 11/12/2017      Chemistry      Component Value Date/Time   NA 143 10/14/2017 0935   NA 144 04/09/2017 0948   K 4.0 10/14/2017 0935   K 4.3 04/09/2017 0948   CL 106 10/14/2017 0935   CO2 28 10/14/2017 0935   CO2 25 04/09/2017 0948   BUN 16 10/14/2017 0935   BUN 16.8 04/09/2017 0948   CREATININE 0.76 10/14/2017 0935   CREATININE 0.8 04/09/2017 0948      Component Value Date/Time    CALCIUM 9.8 10/14/2017 0935   CALCIUM 9.1 04/09/2017 0948   ALKPHOS 249 (H) 10/14/2017 0935   ALKPHOS 216 (H) 04/09/2017 0948   AST 17 10/14/2017 0935   AST 12 04/09/2017 0948   ALT 16 10/14/2017 0935   ALT 13 04/09/2017 0948   BILITOT <0.2 (L) 10/14/2017 0935   BILITOT 0.22 04/09/2017 0948       RADIOGRAPHIC STUDIES: No results found.  ASSESSMENT AND PLAN:  This is a very pleasant 67 years old African-American female with a stage IIIA non-small cell lung cancer status post concurrent chemoradiation followed by consolidation chemotherapy with carboplatin and paclitaxel for 3 cycles. The patient was followed by observation but restaging imaging studies showed evidence for disease progression. She was started on second line treatment with immunotherapy with Nivolumab 480 mg IV every 4 weeks status post 7 cycles.   She is tolerating her treatment with Nivolumab fairly well. I recommended for her to proceed with cycle #8 today as a schedule. I will see the patient back for follow-up visit in 4 weeks for evaluation before starting cycle #9. For the acid reflux she will continue her current treatment with Prilosec. She was advised to call immediately if she has any concerning symptoms in the interval. The patient voices understanding of current disease status and treatment options and is in agreement with the current care plan. All questions were answered. The patient knows to call the clinic with any problems, questions or concerns. We can certainly see the patient much sooner if necessary.  Disclaimer: This note was dictated with voice recognition software. Similar sounding words can inadvertently be transcribed and may not be corrected upon review.

## 2017-12-07 ENCOUNTER — Emergency Department (HOSPITAL_COMMUNITY)
Admission: EM | Admit: 2017-12-07 | Discharge: 2017-12-07 | Disposition: A | Discharge status: Home / Self Care | Payer: Medicare HMO | Attending: Emergency Medicine | Admitting: Emergency Medicine

## 2017-12-07 ENCOUNTER — Encounter (HOSPITAL_COMMUNITY): Payer: Self-pay | Admitting: Emergency Medicine

## 2017-12-07 ENCOUNTER — Other Ambulatory Visit: Payer: Self-pay

## 2017-12-07 ENCOUNTER — Emergency Department (HOSPITAL_COMMUNITY): Payer: Medicare HMO

## 2017-12-07 DIAGNOSIS — R079 Chest pain, unspecified: Secondary | ICD-10-CM | POA: Diagnosis present

## 2017-12-07 DIAGNOSIS — D0221 Carcinoma in situ of right bronchus and lung: Secondary | ICD-10-CM | POA: Insufficient documentation

## 2017-12-07 DIAGNOSIS — F17210 Nicotine dependence, cigarettes, uncomplicated: Secondary | ICD-10-CM | POA: Insufficient documentation

## 2017-12-07 DIAGNOSIS — I10 Essential (primary) hypertension: Secondary | ICD-10-CM | POA: Diagnosis not present

## 2017-12-07 DIAGNOSIS — J181 Lobar pneumonia, unspecified organism: Secondary | ICD-10-CM | POA: Insufficient documentation

## 2017-12-07 DIAGNOSIS — J449 Chronic obstructive pulmonary disease, unspecified: Secondary | ICD-10-CM | POA: Diagnosis not present

## 2017-12-07 DIAGNOSIS — I252 Old myocardial infarction: Secondary | ICD-10-CM | POA: Diagnosis not present

## 2017-12-07 DIAGNOSIS — Z79899 Other long term (current) drug therapy: Secondary | ICD-10-CM | POA: Insufficient documentation

## 2017-12-07 DIAGNOSIS — J189 Pneumonia, unspecified organism: Secondary | ICD-10-CM

## 2017-12-07 LAB — CBC WITH DIFFERENTIAL/PLATELET
Basophils Absolute: 0 10*3/uL (ref 0.0–0.1)
Basophils Relative: 0 %
EOS ABS: 0.1 10*3/uL (ref 0.0–0.7)
EOS PCT: 1 %
HCT: 42.1 % (ref 36.0–46.0)
Hemoglobin: 14.7 g/dL (ref 12.0–15.0)
LYMPHS ABS: 1.3 10*3/uL (ref 0.7–4.0)
Lymphocytes Relative: 14 %
MCH: 30.3 pg (ref 26.0–34.0)
MCHC: 34.9 g/dL (ref 30.0–36.0)
MCV: 86.8 fL (ref 78.0–100.0)
MONO ABS: 0.7 10*3/uL (ref 0.1–1.0)
Monocytes Relative: 8 %
Neutro Abs: 6.7 10*3/uL (ref 1.7–7.7)
Neutrophils Relative %: 77 %
Platelets: 232 10*3/uL (ref 150–400)
RBC: 4.85 MIL/uL (ref 3.87–5.11)
RDW: 14.9 % (ref 11.5–15.5)
WBC: 8.7 10*3/uL (ref 4.0–10.5)

## 2017-12-07 LAB — COMPREHENSIVE METABOLIC PANEL
ALBUMIN: 3.9 g/dL (ref 3.5–5.0)
ALK PHOS: 192 U/L — AB (ref 38–126)
ALT: 16 U/L (ref 0–44)
ANION GAP: 11 (ref 5–15)
AST: 23 U/L (ref 15–41)
BUN: 15 mg/dL (ref 8–23)
CALCIUM: 9.5 mg/dL (ref 8.9–10.3)
CO2: 26 mmol/L (ref 22–32)
Chloride: 105 mmol/L (ref 98–111)
Creatinine, Ser: 0.58 mg/dL (ref 0.44–1.00)
GFR calc non Af Amer: >60 mL/min (ref >60)
GLUCOSE: 107 mg/dL — AB (ref 70–99)
POTASSIUM: 3.9 mmol/L (ref 3.5–5.1)
SODIUM: 142 mmol/L (ref 135–145)
Total Bilirubin: 0.6 mg/dL (ref 0.3–1.2)
Total Protein: 8.2 g/dL — ABNORMAL HIGH (ref 6.5–8.1)

## 2017-12-07 LAB — D-DIMER, QUANTITATIVE (NOT AT ARMC): D DIMER QUANT: 1.86 ug{FEU}/mL — AB (ref 0.00–0.50)

## 2017-12-07 LAB — TROPONIN I: Troponin I: <0.03 ng/mL (ref <0.03)

## 2017-12-07 MED ORDER — TRAMADOL HCL 50 MG PO TABS: 50.0000 mg | ORAL_TABLET | Freq: Four times a day (QID) | ORAL | 0 refills | Status: DC | PRN

## 2017-12-07 MED ORDER — LEVOFLOXACIN 500 MG PO TABS: 750.0000 mg | ORAL_TABLET | Freq: Every day | ORAL | 0 refills | Status: DC

## 2017-12-07 MED ORDER — LEVOFLOXACIN 500 MG PO TABS: 500.0000 mg | ORAL_TABLET | Freq: Once | ORAL | Status: DC

## 2017-12-07 MED ORDER — IOPAMIDOL (ISOVUE-370) INJECTION 76%
100.0000 mL | Freq: Once | INTRAVENOUS | Status: AC | PRN
  Administered 2017-12-07: 100 mL via INTRAVENOUS

## 2017-12-07 MED ORDER — HYDROMORPHONE HCL 1 MG/ML IJ SOLN
0.5000 mg | Freq: Once | INTRAMUSCULAR | Status: AC
  Administered 2017-12-07: 0.5 mg via INTRAVENOUS
  Filled 2017-12-07: qty 1

## 2017-12-07 NOTE — Discharge Instructions (Addendum)
Follow-up with your doctor next week for recheck.  Return to the emergency department if you are getting worse

## 2017-12-07 NOTE — ED Triage Notes (Signed)
Pt has had a cough and pain to rt side of chest/breast area for over a week. States pain is worse with movement or breathing.

## 2017-12-07 NOTE — ED Notes (Signed)
Pt taken to CT. No changes. Nad.

## 2017-12-07 NOTE — ED Provider Notes (Signed)
[facility] EMERGENCY DEPARTMENT Provider Note   CSN: [id number] Arrival date & time: 12/07/17  1133     History   Chief Complaint Chief Complaint  Patient presents with  . Chest Pain    HPI Courtney Zuniga is a 67 y.o. female.  Patient complains of right chest pain and weakness.  The pain is worse with inspiration and movement  The history is provided by the patient. No language interpreter was used.  Chest Pain   This is a new problem. The current episode started 12 to 24 hours ago. The problem occurs constantly. The problem has not changed since onset.The pain is associated with movement. The pain is present in the lateral region. The pain is at a severity of 6/10. The pain is moderate. The quality of the pain is described as dull. The pain does not radiate. Pertinent negatives include no abdominal pain, no back pain, no cough and no headaches.  Pertinent negatives for past medical history include no seizures.    Past Medical History:  Diagnosis Date  . Anemia    as a young woman  . Arthritis    osteoartritis  . Asthma   . Brain tumor (benign) ([place]) 2005 [facility]   Benign  . Chronic headaches   . Chronic hip pain   . Chronic pain   . COPD (chronic obstructive pulmonary disease) ([place])   . Coronary artery disease   . Depression   . Depression 05/15/2016  . Encounter for antineoplastic chemotherapy 01/10/2016  . GERD (gastroesophageal reflux disease)   . Hypertension   . Lung cancer ([place]) dx'd 01/2016   currently on chemo and radiation   . NSTEMI (non-ST elevated myocardial infarction) ([place]) yrs ago  . On home O2    qhs 2 liters at hs and prn  . Pneumonia last time 2 yrs ago  . Shortness of breath dyspnea    with activity    Patient Active Problem List   Diagnosis Date Noted  . Encounter for antineoplastic immunotherapy 04/23/2017  . Depression 05/15/2016  . Adenocarcinoma of right lung, stage 3 ([place]) 01/10/2016  . Encounter for antineoplastic chemotherapy  01/10/2016  . Lung mass 01/03/2016  . Arthritis   . Subepithelial gastric mass   . Mucosal abnormality of stomach   . Abdominal pain 08/08/2015  . Abnormal CT of the abdomen 08/08/2015  . Influenza with pneumonia 06/27/2014  . Hypoxia 06/26/2014  . COPD exacerbation ([place]) 06/26/2014  . CAP (community acquired pneumonia)   . Chest pain, rule out acute myocardial infarction   . Asthma, chronic   . Chest pain 02/16/2014  . HTN (hypertension) 02/16/2014  . DDD (degenerative disc disease), lumbar 02/04/2011  . Lumbar herniated disc 01/09/2011    Past Surgical History:  Procedure Laterality Date  . CHOLECYSTECTOMY    . COLONOSCOPY  2015   [facility] from [facility]  . COLONOSCOPY    . ESOPHAGOGASTRODUODENOSCOPY N/A 08/14/2015   Procedure: ESOPHAGOGASTRODUODENOSCOPY (EGD);  Surgeon: Daneil Dolin, MD;  [facility];  Service: Endoscopy;  Laterality: N/A;  215   . ESOPHAGOGASTRODUODENOSCOPY (EGD) WITH PROPOFOL N/A 09/13/2015   Procedure: ESOPHAGOGASTRODUODENOSCOPY (EGD) WITH PROPOFOL;  Surgeon: Milus Banister, MD;  [facility];  Service: Endoscopy;  Laterality: N/A;  . EUS N/A 03/12/2017   Procedure: UPPER ENDOSCOPIC ULTRASOUND (EUS) RADIAL;  Surgeon: Milus Banister, MD;  [facility];  Service: Endoscopy;  Laterality: N/A;  . TUMOR REMOVAL  2005   Benign  .  UPPER ESOPHAGEAL ENDOSCOPIC ULTRASOUND (EUS)  09/13/2015   Procedure: UPPER ESOPHAGEAL ENDOSCOPIC ULTRASOUND (EUS);  Surgeon: Milus Banister, MD;  Location: [facility];  Service: Endoscopy;;  . VIDEO BRONCHOSCOPY WITH ENDOBRONCHIAL NAVIGATION N/A 12/31/2015   Procedure: VIDEO BRONCHOSCOPY WITH ENDOBRONCHIAL NAVIGATION;  Surgeon: Melrose Nakayama, MD;  Location: [place];  Service: Thoracic;  Laterality: N/A;  . VIDEO BRONCHOSCOPY WITH ENDOBRONCHIAL ULTRASOUND N/A 11/08/2015   Procedure: VIDEO BRONCHOSCOPY WITH ENDOBRONCHIAL ULTRASOUND;  Surgeon: Ivin Poot, MD;  Location: [place];  Service: Thoracic;  Laterality: N/A;     OB History   None      Home Medications    Prior to Admission medications   Medication Sig Start Date End Date Taking? Authorizing Provider  acetaminophen (TYLENOL) 500 MG tablet Take 500 mg by mouth every 6 (six) hours as needed for mild pain or moderate pain.   Yes [provider]  albuterol (PROVENTIL HFA;VENTOLIN HFA) 108 (90 BASE) MCG/ACT inhaler Inhale 2 puffs into the lungs every 4 (four) hours as needed for shortness of breath.    Yes [provider]  Aspirin-Salicylamide-Caffeine (BC HEADACHE POWDER PO) Take 1 packet by mouth 2 (two) times daily as needed (headache).    Yes [provider]  BREO ELLIPTA 100-25 MCG/INH AEPB Inhale 1 puff into the lungs daily as needed. 04/30/17  Yes [provider]  cyclobenzaprine (FLEXERIL) 10 MG tablet Take 10 mg by mouth 3 (three) times daily as needed for muscle spasms.  03/27/16  Yes [provider]  diclofenac sodium (VOLTAREN) 1 % GEL Apply topically 4 (four) times daily as needed (massge gel into affected area(s) as needed for pain).  08/11/17  Yes [provider]  guaifenesin (ROBITUSSIN) 100 MG/5ML syrup Take 200 mg by mouth 3 (three) times daily as needed for cough.   Yes [provider]  metoprolol tartrate (LOPRESSOR) 25 MG tablet Take 1 tablet by mouth 2 (two) times daily. 03/27/17  Yes [provider]  omeprazole (PRILOSEC) 40 MG capsule  09/16/17  Yes [provider]  levofloxacin (LEVAQUIN) 500 MG tablet Take 1.5 tablets (750 mg total) by mouth daily. X 7 days 12/07/17   [name], MD  lidocaine-prilocaine (EMLA) cream Apply 1 application topically 4 (four) times daily. 2g up to 4 times daily 06/22/17   [provider]  traMADol (ULTRAM) 50 MG tablet Take 1 tablet (50 mg total) by mouth every 6 (six) hours as needed for moderate pain. 12/07/17   [name], MD    Family History Family History    Problem Relation Age of Onset  . Ovarian cancer Mother   . Lung cancer Father   . Lung cancer Brother   . Lung cancer Brother   . Prostate cancer Brother   . Brain cancer Sister        Half sister    Social History Social History   Tobacco Use  . Smoking status: Current Every Day Smoker    Packs/day: 1.00    Years: 38.00    Pack years: 38.00    Types: Cigarettes    Last attempt to quit: 01/15/2016    Years since quitting: 1.8  . Smokeless tobacco: Never Used  . Tobacco comment: Pt has asked doctor for med to help   Substance Use Topics  . Alcohol use: No    Alcohol/week: 0.0 standard drinks  . Drug use: No     Allergies   Patient has no known allergies.  Review of Systems Review of Systems  Constitutional: Negative for appetite change and fatigue.  HENT: Negative for congestion, ear discharge and sinus pressure.   Eyes: Negative for discharge.  Respiratory: Negative for cough.   Cardiovascular: Positive for chest pain.  Gastrointestinal: Negative for abdominal pain and diarrhea.  Genitourinary: Negative for frequency and hematuria.  Musculoskeletal: Negative for back pain.  Skin: Negative for rash.  Neurological: Negative for seizures and headaches.  Psychiatric/Behavioral: Negative for hallucinations.     Physical Exam Updated Vital Signs BP 117/77   Pulse (!) 106   Temp 98.5 F (36.9 C) (Oral)   Resp (!) 27   Ht 5\' 6"  (1.676 m)   Wt 62.6 kg   SpO2 95%   BMI 22.27 kg/m   Physical Exam  Constitutional: She is oriented to person, place, and time. She appears well-developed.  HENT:  Head: Normocephalic.  Eyes: Conjunctivae and EOM are normal. No scleral icterus.  Neck: Neck supple. No thyromegaly present.  Cardiovascular: Normal rate and regular rhythm. Exam reveals no gallop and no friction rub.  No murmur heard. Pulmonary/Chest: No stridor. She has no wheezes. She has no rales. She exhibits no tenderness.  Abdominal: She exhibits no  distension. There is no tenderness. There is no rebound.  Musculoskeletal: Normal range of motion. She exhibits no edema.  Lymphadenopathy:    She has no cervical adenopathy.  Neurological: She is oriented to person, place, and time. She exhibits normal muscle tone. Coordination normal.  Skin: No rash noted. No erythema.  Psychiatric: She has a normal mood and affect. Her behavior is normal.     ED Treatments / Results  Labs (all labs ordered are listed, but only abnormal results are displayed) Labs Reviewed  COMPREHENSIVE METABOLIC PANEL - Abnormal; Notable for the following components:      Result Value   Glucose, Bld 107 (*)    Total Protein 8.2 (*)    Alkaline Phosphatase 192 (*)    All other components within normal limits  D-DIMER, QUANTITATIVE (NOT AT [facility]) - Abnormal; Notable for the following components:   D-Dimer, Quant 1.86 (*)    All other components within normal limits  CBC WITH DIFFERENTIAL/PLATELET  TROPONIN I    EKG EKG Interpretation  Date/Time:  Monday December 07 2017 12:02:48 EDT Ventricular Rate:  92 PR Interval:    QRS Duration: 84 QT Interval:  385 QTC Calculation: 474 R Axis:   29 Text Interpretation:  Sinus rhythm Consider right atrial enlargement Lateral leads are also involved Baseline wander in lead(s) V3 Confirmed by Julianne Rice [phone]) on 12/07/2017 12:06:48 PM   Radiology Dg Chest 2 View  Result Date: 12/07/2017 CLINICAL DATA:  67 year old with right chest pain and cough. EXAM: CHEST - 2 VIEW COMPARISON:  Chest CT 10/12/2017 and chest radiograph 04/29/2016 FINDINGS: Hyperinflation with flattening of the hemidiaphragms and findings are compatible with emphysema. There are new patchy densities in the right upper lung and apex. Findings are most compatible with pneumonia. Linear and parenchymal densities in the lower chest are suggestive for chronic changes. Difficult to exclude mild airspace disease in the lower lobes. Heart and mediastinum  are stable. Again noted is focal volume loss along the right cardiac border. No large pleural effusions. No acute bone abnormality. Atherosclerotic calcifications at the aortic arch. IMPRESSION: New patchy airspace densities in the right upper lung near the apex. Findings are most compatible with pneumonia. Chronic changes with emphysema. Electronically Signed   By: Scherrie Gerlach.D.  On: 12/07/2017 13:11   Ct Angio Chest Pe W And/or Wo Contrast  Result Date: 12/07/2017 CLINICAL DATA:  Cough and right-sided chest pain EXAM: CT ANGIOGRAPHY CHEST WITH CONTRAST TECHNIQUE: Multidetector CT imaging of the chest was performed using the standard protocol during bolus administration of intravenous contrast. Multiplanar CT image reconstructions and MIPs were obtained to evaluate the vascular anatomy. CONTRAST:  135mL ISOVUE-370 IOPAMIDOL (ISOVUE-370) INJECTION 76% COMPARISON:  10/12/2017 FINDINGS: Cardiovascular: Thoracic aorta demonstrates atherosclerotic calcifications without aneurysmal dilatation. Coronary calcifications are seen. No cardiac enlargement is noted. Pulmonary artery is well visualized with a normal branching pattern. No filling defects to suggest pulmonary emboli are identified. Mediastinum/Nodes: Esophagus is within normal limits. No hilar or mediastinal adenopathy is noted. Thoracic inlet is unremarkable. Lungs/Pleura: Emphysematous changes are noted. Persistent right middle lobe consolidation is near the minor fissure. The upper lobe nodule seen on the prior exam is again identified best noted on image number 65 of series 7. Left upper lobe posterior pulmonary nodule is again noted on image number 79 of series 7 and stable. Nodular changes in the superior segment of the right lower lobe are again noted and stable. Patchy infiltrative changes are noted superimposed over chronic changes in the right upper lobe similar to that seen on the recent chest x-ray. No other focal infiltrate or effusion is  noted. Some scarring is noted in the bases bilaterally stable from the recent exam. Upper Abdomen: Visualized upper abdomen is within normal limits. Musculoskeletal: Degenerative changes of the thoracic spine are noted. Review of the MIP images confirms the above findings. IMPRESSION: Stable nodular and consolidative changes when compared with the prior exam. Acute infiltrate in the right upper lobe superimposed over chronic emphysematous changes. Aortic Atherosclerosis (ICD10-I70.0) and Emphysema (ICD10-J43.9). Electronically Signed   By: Inez Catalina M.D.   On: 12/07/2017 16:24    Procedures Procedures (including critical care time)  Medications Ordered in ED Medications  levofloxacin (LEVAQUIN) tablet 500 mg (has no administration in time range)  HYDROmorphone (DILAUDID) injection 0.5 mg (0.5 mg Intravenous Given 12/07/17 1239)  iopamidol (ISOVUE-370) 76 % injection 100 mL (100 mLs Intravenous Contrast Given 12/07/17 1553)     Initial Impression / Assessment and Plan / ED Course  I have reviewed the triage vital signs and the nursing notes.  Pertinent labs & imaging results that were available during my care of the patient were reviewed by me and considered in my medical decision making (see chart for details).     CT scan shows pneumonia.  No PE.  Patient is nontoxic.  She will be placed on Levaquin and given Ultram and will follow-up with her doctor within a week.  She will return if problems  Final Clinical Impressions(s) / ED Diagnoses   Final diagnoses:  Community acquired pneumonia of right upper lobe of lung [facility])    ED Discharge Orders         Ordered    levofloxacin (LEVAQUIN) 500 MG tablet  Daily     12/07/17 1659    traMADol (ULTRAM) 50 MG tablet  Every 6 hours PRN     12/07/17 1659           [name], MD 12/07/17 1703

## 2017-12-10 ENCOUNTER — Ambulatory Visit (HOSPITAL_COMMUNITY)
Admission: RE | Admit: 2017-12-10 | Discharge: 2017-12-10 | Disposition: A | Discharge status: Home / Self Care | Payer: Medicare HMO | Source: Ambulatory Visit | Attending: Oncology | Admitting: Oncology

## 2017-12-10 ENCOUNTER — Inpatient Hospital Stay (HOSPITAL_BASED_OUTPATIENT_CLINIC_OR_DEPARTMENT_OTHER): Payer: Medicare HMO | Admitting: Oncology

## 2017-12-10 ENCOUNTER — Encounter: Payer: Self-pay | Admitting: Oncology

## 2017-12-10 ENCOUNTER — Ambulatory Visit: Payer: Medicare HMO | Admitting: Internal Medicine

## 2017-12-10 ENCOUNTER — Inpatient Hospital Stay: Payer: Medicare HMO

## 2017-12-10 ENCOUNTER — Other Ambulatory Visit: Payer: Self-pay

## 2017-12-10 ENCOUNTER — Ambulatory Visit: Payer: Medicare HMO

## 2017-12-10 ENCOUNTER — Other Ambulatory Visit: Payer: Medicare HMO

## 2017-12-10 VITALS — BP 143/87 | HR 103 | Temp 98.5°F | Resp 18 | Ht 66.0 in | Wt 136.9 lb

## 2017-12-10 DIAGNOSIS — R05 Cough: Secondary | ICD-10-CM

## 2017-12-10 DIAGNOSIS — Z5112 Encounter for antineoplastic immunotherapy: Secondary | ICD-10-CM

## 2017-12-10 DIAGNOSIS — C3491 Malignant neoplasm of unspecified part of right bronchus or lung: Secondary | ICD-10-CM

## 2017-12-10 DIAGNOSIS — J439 Emphysema, unspecified: Secondary | ICD-10-CM | POA: Insufficient documentation

## 2017-12-10 DIAGNOSIS — M940 Chondrocostal junction syndrome [Tietze]: Secondary | ICD-10-CM

## 2017-12-10 DIAGNOSIS — R0609 Other forms of dyspnea: Secondary | ICD-10-CM

## 2017-12-10 DIAGNOSIS — C342 Malignant neoplasm of middle lobe, bronchus or lung: Secondary | ICD-10-CM | POA: Diagnosis not present

## 2017-12-10 DIAGNOSIS — J189 Pneumonia, unspecified organism: Secondary | ICD-10-CM | POA: Insufficient documentation

## 2017-12-10 DIAGNOSIS — R079 Chest pain, unspecified: Secondary | ICD-10-CM

## 2017-12-10 DIAGNOSIS — R918 Other nonspecific abnormal finding of lung field: Secondary | ICD-10-CM | POA: Insufficient documentation

## 2017-12-10 LAB — CBC WITH DIFFERENTIAL (CANCER CENTER ONLY)
BASOS ABS: 0 10*3/uL (ref 0.0–0.1)
BASOS PCT: 0 %
Eosinophils Absolute: 0 10*3/uL (ref 0.0–0.5)
Eosinophils Relative: 1 %
HEMATOCRIT: 36.6 % (ref 34.8–46.6)
Hemoglobin: 12.4 g/dL (ref 11.6–15.9)
LYMPHS PCT: 10 %
Lymphs Abs: 0.9 10*3/uL (ref 0.9–3.3)
MCH: 29.3 pg (ref 25.1–34.0)
MCHC: 33.9 g/dL (ref 31.5–36.0)
MCV: 86.4 fL (ref 79.5–101.0)
Monocytes Absolute: 0.8 10*3/uL (ref 0.1–0.9)
Monocytes Relative: 10 %
NEUTROS ABS: 7 10*3/uL — AB (ref 1.5–6.5)
Neutrophils Relative %: 79 %
PLATELETS: 241 10*3/uL (ref 145–400)
RBC: 4.24 MIL/uL (ref 3.70–5.45)
RDW: 14.7 % — ABNORMAL HIGH (ref 11.2–14.5)
WBC: 8.8 10*3/uL (ref 3.9–10.3)

## 2017-12-10 LAB — CMP (CANCER CENTER ONLY)
ALK PHOS: 196 U/L — AB (ref 38–126)
ALT: 20 U/L (ref 0–44)
AST: 25 U/L (ref 15–41)
Albumin: 2.8 g/dL — ABNORMAL LOW (ref 3.5–5.0)
Anion gap: 10 (ref 5–15)
BILIRUBIN TOTAL: 0.3 mg/dL (ref 0.3–1.2)
BUN: 13 mg/dL (ref 8–23)
CHLORIDE: 102 mmol/L (ref 98–111)
CO2: 27 mmol/L (ref 22–32)
Calcium: 9.7 mg/dL (ref 8.9–10.3)
Creatinine: 0.66 mg/dL (ref 0.44–1.00)
Glucose, Bld: 73 mg/dL (ref 70–99)
Potassium: 3.9 mmol/L (ref 3.5–5.1)
Sodium: 139 mmol/L (ref 135–145)
Total Protein: 7.3 g/dL (ref 6.5–8.1)

## 2017-12-10 LAB — TSH: TSH: 2.527 u[IU]/mL (ref 0.308–3.960)

## 2017-12-10 MED ORDER — ETODOLAC 200 MG PO CAPS: 200.0000 mg | ORAL_CAPSULE | Freq: Three times a day (TID) | ORAL | 0 refills | Status: DC | PRN

## 2017-12-10 MED ORDER — KETOROLAC TROMETHAMINE 60 MG/2ML IM SOLN
30.0000 mg | Freq: Once | INTRAMUSCULAR | Status: AC
  Administered 2017-12-10: 30 mg via INTRAMUSCULAR
  Filled 2017-12-10: qty 2

## 2017-12-10 MED ORDER — HYDROCODONE-ACETAMINOPHEN 5-325 MG PO TABS: 1.0000 | ORAL_TABLET | Freq: Four times a day (QID) | ORAL | 0 refills | Status: AC | PRN

## 2017-12-10 MED ORDER — KETOROLAC TROMETHAMINE 15 MG/ML IJ SOLN
INTRAMUSCULAR | Status: AC
  Filled 2017-12-10: qty 1

## 2017-12-10 NOTE — Assessment & Plan Note (Addendum)
This is a very pleasant [name] with a stage IIIA non-small cell lung cancer status post concurrent chemoradiation followed by consolidation chemotherapy with carboplatin and paclitaxel for 3 cycles. The patient was followed by observation but restaging imaging studies showed evidence for disease progression. She was started on second line treatment with immunotherapy with Nivolumab 480 mg IV every 4 weeks status post 8 cycles.   She is tolerating her treatment with Nivolumab fairly well. She was seen in the emergency room several days ago for right-sided chest pain and was found to have pneumonia.  She was charted on oral levofloxacin and given tramadol for pain.  A CT angiogram was performed during her ER visit which did not show any evidence of PE and showed overall stable lung cancer.  The only new finding was the acute infiltrate consistent with pneumonia. She is having significant right-sided chest pain despite taking tramadol.  Pain is reproducible with palpation. The patient is afebrile today.  Her white blood cell count is normal. I will delay the start of cycle 9 of her treatment by approximately 1 week. She was advised to continue Levaquin 750 mg daily and complete the entire course of antibiotics.  She was given a dose of Toradol 30 mg IM in our office today and a prescription for etodolac 200 mg 3 times a day with food.  I have also given her a limited supply of hydrocodone 5/325 mg 1 every 6 hours as needed for severe pain.  I advised the patient that we would not refill this medication.  She was advised to use cough medication. A stat chest x-ray was obtained today and it was consistent with the CT scan and chest x-ray performed on 12/07/2017.  The patient was notified of these findings. The patient was advised to contact her primary care provider or seek care in the emergency room if she is not improving in the next 24 to 48 hours or if she develops any fevers,  chills, increased shortness of breath, or any other concerning symptoms.  The patient will follow-up in approximately 5 weeks for evaluation prior to cycle #10 of her treatment.  She was advised to call immediately if she has any concerning symptoms in the interval. The patient voices understanding of current disease status and treatment options and is in agreement with the current care plan. All questions were answered. The patient knows to call the clinic with any problems, questions or concerns. We can certainly see the patient much sooner if necessary.

## 2017-12-10 NOTE — Progress Notes (Signed)
[facility] OFFICE PROGRESS NOTE  Barry Dienes, NP [place] [address]  DIAGNOSIS: Stage IIIA (T1b, N2, M0) non-small cell lung cancer presented with right middle lobe pulmonary nodule, mediastinal lymphadenopathy and highly suspicious for small nodule in the left upper lobe that could change her stage to stage IV that could present another synchronous primary lesion in the left upper lobe. This was diagnosed in September 2017.  PRIOR THERAPY:  1) Concurrent chemoradiation with weekly carboplatin for AUC of 2 and paclitaxel 45 MG/M2 status post 6 cycles last dose was given 02/25/2016 with partial response. 2) Consolidation chemotherapy with carboplatin for AUC of 5 and paclitaxel 175 MG/M2 every 3 weeks with Neulasta support. First dose 04/22/2016. Status post 3 cycles.  CURRENT THERAPY: Second line treatment with immunotherapy with Nivolumab 480 mg IV every 4 weeks status post 8 cycles.  INTERVAL HISTORY: Courtney Zuniga 67 y.o. female returns for a routine follow-up visit by herself.  The patient reports that she was seen in the emergency room on 12/07/2017 was diagnosed with pneumonia.  She was started on oral levofloxacin and given tramadol for her right-sided chest pain.  Today the patient reports that her pain is not well controlled with tramadol.  Pain increases when she coughs.  When she holds her right side of her chest the pain seems to diminish.  She denies fevers and chills.  Reports a cough but no hemoptysis.  Her cough is not productive.  Reports dyspnea on exertion.  Denies nausea, vomiting, constipation, diarrhea.  The patient is here for evaluation prior to cycle #9 of nivolumab.  MEDICAL HISTORY: Past Medical History:  Diagnosis Date  . Anemia    as a young woman  . Arthritis    osteoartritis  . Asthma   . Brain tumor (benign) ([place]) 2005 [facility]   Benign  . Chronic headaches   . Chronic hip pain   . Chronic pain   . COPD (chronic  obstructive pulmonary disease) ([place])   . Coronary artery disease   . Depression   . Depression 05/15/2016  . Encounter for antineoplastic chemotherapy 01/10/2016  . GERD (gastroesophageal reflux disease)   . Hypertension   . Lung cancer ([place]) dx'd 01/2016   currently on chemo and radiation   . NSTEMI (non-ST elevated myocardial infarction) ([place]) yrs ago  . On home O2    qhs 2 liters at hs and prn  . Pneumonia last time 2 yrs ago  . Shortness of breath dyspnea    with activity    ALLERGIES:  has No Known Allergies.  MEDICATIONS:  Current Outpatient Medications  Medication Sig Dispense Refill  . albuterol (PROVENTIL HFA;VENTOLIN HFA) 108 (90 BASE) MCG/ACT inhaler Inhale 2 puffs into the lungs every 4 (four) hours as needed for shortness of breath.     Marland Kitchen BREO ELLIPTA 100-25 MCG/INH AEPB Inhale 1 puff into the lungs daily as needed.    . cyclobenzaprine (FLEXERIL) 10 MG tablet Take 10 mg by mouth 3 (three) times daily as needed for muscle spasms.     . diclofenac sodium (VOLTAREN) 1 % GEL Apply topically 4 (four) times daily as needed (massge gel into affected area(s) as needed for pain).   1  . levofloxacin (LEVAQUIN) 500 MG tablet Take 1.5 tablets (750 mg total) by mouth daily. X 7 days 7 tablet 0  . lidocaine-prilocaine (EMLA) cream Apply 1 application topically 4 (four) times daily. 2g up to 4 times daily  2  .  metoprolol tartrate (LOPRESSOR) 25 MG tablet Take 1 tablet by mouth 2 (two) times daily.    Marland Kitchen omeprazole (PRILOSEC) 40 MG capsule   1  . traMADol (ULTRAM) 50 MG tablet Take 1 tablet (50 mg total) by mouth every 6 (six) hours as needed for moderate pain. 20 tablet 0  . acetaminophen (TYLENOL) 500 MG tablet Take 500 mg by mouth every 6 (six) hours as needed for mild pain or moderate pain.    . Aspirin-Salicylamide-Caffeine (BC HEADACHE POWDER PO) Take 1 packet by mouth 2 (two) times daily as needed (headache).     . etodolac (LODINE) 200 MG capsule Take 1 capsule (200 mg total) by  mouth 3 (three) times daily as needed. Take with food. 30 capsule 0  . guaifenesin (ROBITUSSIN) 100 MG/5ML syrup Take 200 mg by mouth 3 (three) times daily as needed for cough.    Marland Kitchen HYDROcodone-acetaminophen (NORCO/VICODIN) 5-325 MG tablet Take 1 tablet by mouth every 6 (six) hours as needed for up to 5 days. 20 tablet 0   No current facility-administered medications for this visit.     SURGICAL HISTORY:  Past Surgical History:  Procedure Laterality Date  . CHOLECYSTECTOMY    . COLONOSCOPY  2015   Results requested from [facility]  . COLONOSCOPY    . ESOPHAGOGASTRODUODENOSCOPY N/A 08/14/2015   Procedure: ESOPHAGOGASTRODUODENOSCOPY (EGD);  Surgeon: Daneil Dolin, MD;  Location[facility];  Service: Endoscopy;  Laterality: N/A;  215   . ESOPHAGOGASTRODUODENOSCOPY (EGD) WITH PROPOFOL N/A 09/13/2015   Procedure: ESOPHAGOGASTRODUODENOSCOPY (EGD) WITH PROPOFOL;  Surgeon: Milus Banister, MD;  Location: [facility];  Service: Endoscopy;  Laterality: N/A;  . EUS N/A 03/12/2017   Procedure: UPPER ENDOSCOPIC ULTRASOUND (EUS) RADIAL;  Surgeon: Milus Banister, MD;  Location: [facility];  Service: Endoscopy;  Laterality: N/A;  . TUMOR REMOVAL  2005   Benign  . UPPER ESOPHAGEAL ENDOSCOPIC ULTRASOUND (EUS)  09/13/2015   Procedure: UPPER ESOPHAGEAL ENDOSCOPIC ULTRASOUND (EUS);  Surgeon: Milus Banister, MD;  Location: [facility];  Service: Endoscopy;;  . VIDEO BRONCHOSCOPY WITH ENDOBRONCHIAL NAVIGATION N/A 12/31/2015   Procedure: VIDEO BRONCHOSCOPY WITH ENDOBRONCHIAL NAVIGATION;  Surgeon: Melrose Nakayama, MD;  Location: [facility];  Service: Thoracic;  Laterality: N/A;  . VIDEO BRONCHOSCOPY WITH ENDOBRONCHIAL ULTRASOUND N/A 11/08/2015   Procedure: VIDEO BRONCHOSCOPY WITH ENDOBRONCHIAL ULTRASOUND;  Surgeon: Ivin Poot, MD;  Location: [facility];  Service: Thoracic;  Laterality: N/A;    REVIEW OF SYSTEMS:   Review of Systems  Constitutional: Negative for appetite change, chills, fever  and unexpected weight change.  Positive for fatigue. HENT:   Negative for mouth sores, nosebleeds, sore throat and trouble swallowing.   Eyes: Negative for eye problems and icterus.  Respiratory: Negative for hemoptysis, shortness of breath chest and wheezing.  Defer nonproductive cough and dyspnea on exertion. Cardiovascular: Negative for leg swelling.  She has right-sided chest pain which worsens with cough. Gastrointestinal: Negative for abdominal pain, constipation, diarrhea, nausea and vomiting.  Genitourinary: Negative for bladder incontinence, difficulty urinating, dysuria, frequency and hematuria.   Musculoskeletal: Negative for back pain, gait problem, neck pain and neck stiffness.  Skin: Negative for itching and rash.  Neurological: Negative for dizziness, extremity weakness, gait problem, headaches, light-headedness and seizures.  Hematological: Negative for adenopathy. Does not bruise/bleed easily.  Psychiatric/Behavioral: Negative for confusion, depression and sleep disturbance. The patient is not nervous/anxious.     PHYSICAL EXAMINATION:  Blood pressure (!) 143/87, pulse (!) 103, temperature 98.5 F (36.9  C), temperature source Oral, resp. rate 18, height 5\' 6"  (1.676 m), weight 136 lb 14.4 oz (62.1 kg), SpO2 93 %.  ECOG PERFORMANCE STATUS: 1 - Symptomatic but completely ambulatory  Physical Exam  Constitutional: Oriented to person, place, and time.  Appears to have pain but is nontoxic looking. HENT:  Head: Normocephalic and atraumatic.  Mouth/Throat: Oropharynx is clear and moist. No oropharyngeal exudate.  Eyes: Conjunctivae are normal. Right eye exhibits no discharge. Left eye exhibits no discharge. No scleral icterus.  Neck: Normal range of motion. Neck supple.  Cardiovascular: Normal rate, regular rhythm, normal heart sounds and intact distal pulses.   Pulmonary/Chest: Effort normal.  Diminished breath sounds posterior right lung fields.  No respiratory distress. No  wheezes. No rales.  She has pain with palpation to her right chest wall. Abdominal: Soft. Bowel sounds are normal. Exhibits no distension and no mass. There is no tenderness.  Musculoskeletal: Normal range of motion. Exhibits no edema.  Lymphadenopathy:    No cervical adenopathy.  Neurological: Alert and oriented to person, place, and time. Exhibits normal muscle tone. Gait normal. Coordination normal.  Skin: Skin is warm and dry. No rash noted. Not diaphoretic. No erythema. No pallor.  Psychiatric: Mood, memory and judgment normal.  Vitals reviewed.  LABORATORY DATA: Lab Results  Component Value Date   WBC 8.8 12/10/2017   HGB 12.4 12/10/2017   HCT 36.6 12/10/2017   MCV 86.4 12/10/2017   PLT 241 12/10/2017      Chemistry      Component Value Date/Time   NA 139 12/10/2017 1142   NA 144 04/09/2017 0948   K 3.9 12/10/2017 1142   K 4.3 04/09/2017 0948   CL 102 12/10/2017 1142   CO2 27 12/10/2017 1142   CO2 25 04/09/2017 0948   BUN 13 12/10/2017 1142   BUN 16.8 04/09/2017 0948   CREATININE 0.66 12/10/2017 1142   CREATININE 0.8 04/09/2017 0948      Component Value Date/Time   CALCIUM 9.7 12/10/2017 1142   CALCIUM 9.1 04/09/2017 0948   ALKPHOS 196 (H) 12/10/2017 1142   ALKPHOS 216 (H) 04/09/2017 0948   AST 25 12/10/2017 1142   AST 12 04/09/2017 0948   ALT 20 12/10/2017 1142   ALT 13 04/09/2017 0948   BILITOT 0.3 12/10/2017 1142   BILITOT 0.22 04/09/2017 0948       RADIOGRAPHIC STUDIES:  Dg Chest 2 View  Result Date: 12/10/2017 CLINICAL DATA:  Shortness of breath.  Adenocarcinoma of right lung. EXAM: CHEST - 2 VIEW COMPARISON:  CT scan and radiographs of December 07, 2017. FINDINGS: The heart size and mediastinal contours are within normal limits. No pneumothorax or pleural effusion is noted. Hyperexpansion of the lungs is noted. No pneumothorax or pleural effusion is noted. Stable right apical opacity is noted consistent with combination of acute pneumonia and chronic  emphysematous disease. Left lung is clear. The visualized skeletal structures are unremarkable. IMPRESSION: Stable right apical opacity is noted consistent with combination of acute pneumonia and chronic emphysematous disease. Electronically Signed   By: Marijo Conception, M.D.   On: 12/10/2017 14:46   Dg Chest 2 View  Result Date: 12/07/2017 CLINICAL DATA:  67 year old with right chest pain and cough. EXAM: CHEST - 2 VIEW COMPARISON:  Chest CT 10/12/2017 and chest radiograph 04/29/2016 FINDINGS: Hyperinflation with flattening of the hemidiaphragms and findings are compatible with emphysema. There are new patchy densities in the right upper lung and apex. Findings are most compatible with pneumonia. Linear  and parenchymal densities in the lower chest are suggestive for chronic changes. Difficult to exclude mild airspace disease in the lower lobes. Heart and mediastinum are stable. Again noted is focal volume loss along the right cardiac border. No large pleural effusions. No acute bone abnormality. Atherosclerotic calcifications at the aortic arch. IMPRESSION: New patchy airspace densities in the right upper lung near the apex. Findings are most compatible with pneumonia. Chronic changes with emphysema. Electronically Signed   By: Markus Daft M.D.   On: 12/07/2017 13:11   Ct Angio Chest Pe W And/or Wo Contrast  Result Date: 12/07/2017 CLINICAL DATA:  Cough and right-sided chest pain EXAM: CT ANGIOGRAPHY CHEST WITH CONTRAST TECHNIQUE: Multidetector CT imaging of the chest was performed using the standard protocol during bolus administration of intravenous contrast. Multiplanar CT image reconstructions and MIPs were obtained to evaluate the vascular anatomy. CONTRAST:  170mL ISOVUE-370 IOPAMIDOL (ISOVUE-370) INJECTION 76% COMPARISON:  10/12/2017 FINDINGS: Cardiovascular: Thoracic aorta demonstrates atherosclerotic calcifications without aneurysmal dilatation. Coronary calcifications are seen. No cardiac  enlargement is noted. Pulmonary artery is well visualized with a normal branching pattern. No filling defects to suggest pulmonary emboli are identified. Mediastinum/Nodes: Esophagus is within normal limits. No hilar or mediastinal adenopathy is noted. Thoracic inlet is unremarkable. Lungs/Pleura: Emphysematous changes are noted. Persistent right middle lobe consolidation is near the minor fissure. The upper lobe nodule seen on the prior exam is again identified best noted on image number 65 of series 7. Left upper lobe posterior pulmonary nodule is again noted on image number 79 of series 7 and stable. Nodular changes in the superior segment of the right lower lobe are again noted and stable. Patchy infiltrative changes are noted superimposed over chronic changes in the right upper lobe similar to that seen on the recent chest x-ray. No other focal infiltrate or effusion is noted. Some scarring is noted in the bases bilaterally stable from the recent exam. Upper Abdomen: Visualized upper abdomen is within normal limits. Musculoskeletal: Degenerative changes of the thoracic spine are noted. Review of the MIP images confirms the above findings. IMPRESSION: Stable nodular and consolidative changes when compared with the prior exam. Acute infiltrate in the right upper lobe superimposed over chronic emphysematous changes. Aortic Atherosclerosis (ICD10-I70.0) and Emphysema (ICD10-J43.9). Electronically Signed   By: Inez Catalina M.D.   On: 12/07/2017 16:24     ASSESSMENT/PLAN:  Adenocarcinoma of right lung, stage 3 (HCC) This is a very pleasant [name] with a stage IIIA non-small cell lung cancer status post concurrent chemoradiation followed by consolidation chemotherapy with carboplatin and paclitaxel for 3 cycles. The patient was followed by observation but restaging imaging studies showed evidence for disease progression. She was started on second line treatment with immunotherapy  with Nivolumab 480 mg IV every 4 weeks status post 8 cycles.   She is tolerating her treatment with Nivolumab fairly well. She was seen in the emergency room several days ago for right-sided chest pain and was found to have pneumonia.  She was charted on oral levofloxacin and given tramadol for pain.  A CT angiogram was performed during her ER visit which did not show any evidence of PE and showed overall stable lung cancer.  The only new finding was the acute infiltrate consistent with pneumonia. She is having significant right-sided chest pain despite taking tramadol.  Pain is reproducible with palpation. The patient is afebrile today.  Her white blood cell count is normal. I will delay the start of cycle 9  of her treatment by approximately 1 week. She was advised to continue Levaquin 750 mg daily and complete the entire course of antibiotics.  She was given a dose of Toradol 30 mg IM in our office today and a prescription for etodolac 200 mg 3 times a day with food.  I have also given her a limited supply of hydrocodone 5/325 mg 1 every 6 hours as needed for severe pain.  I advised the patient that we would not refill this medication.  She was advised to use cough medication. A stat chest x-ray was obtained today and it was consistent with the CT scan and chest x-ray performed on 12/07/2017.  The patient was notified of these findings. The patient was advised to contact her primary care provider or seek care in the emergency room if she is not improving in the next 24 to 48 hours or if she develops any fevers, chills, increased shortness of breath, or any other concerning symptoms.  The patient will follow-up in approximately 5 weeks for evaluation prior to cycle #10 of her treatment.  She was advised to call immediately if she has any concerning symptoms in the interval. The patient voices understanding of current disease status and treatment options and is in agreement with the current care  plan. All questions were answered. The patient knows to call the clinic with any problems, questions or concerns. We can certainly see the patient much sooner if necessary.   Orders Placed This Encounter  Procedures  . DG Chest 2 View    Standing Status:   Future    Number of Occurrences:   1    Standing Expiration Date:   12/10/2018    Order Specific Question:   Reason for Exam (SYMPTOM  OR DIAGNOSIS REQUIRED)    Answer:   Pt with pneumonia. Increased right sided chest pain. Eval for rib fracture/worsening pneumonia.    Order Specific Question:   Preferred imaging location?    Answer:   [facility]    Order Specific Question:   Radiology Contrast Protocol - do NOT remove file path    Answer:   [email or web address]     Mikey Bussing, DNP, AGPCNP-BC, AOCNP 12/10/17

## 2017-12-18 ENCOUNTER — Telehealth: Payer: Self-pay | Admitting: Oncology

## 2017-12-18 NOTE — Telephone Encounter (Signed)
Scheduled appt per 9/6 sch message and 8/29 los - Patient is aware of 9/9 appt and aware of all other appts scheduled and will get an updated schedule when she comes in for treatment on 9/9.

## 2017-12-21 ENCOUNTER — Telehealth: Payer: Self-pay

## 2017-12-21 ENCOUNTER — Inpatient Hospital Stay: Payer: Medicare HMO | Attending: Oncology

## 2017-12-21 ENCOUNTER — Inpatient Hospital Stay: Payer: Medicare HMO

## 2017-12-21 VITALS — BP 104/68 | HR 83 | Temp 98.1°F | Resp 18

## 2017-12-21 DIAGNOSIS — C342 Malignant neoplasm of middle lobe, bronchus or lung: Secondary | ICD-10-CM | POA: Diagnosis present

## 2017-12-21 DIAGNOSIS — Z5112 Encounter for antineoplastic immunotherapy: Secondary | ICD-10-CM | POA: Insufficient documentation

## 2017-12-21 DIAGNOSIS — Z79899 Other long term (current) drug therapy: Secondary | ICD-10-CM | POA: Diagnosis not present

## 2017-12-21 DIAGNOSIS — C3491 Malignant neoplasm of unspecified part of right bronchus or lung: Secondary | ICD-10-CM

## 2017-12-21 LAB — COMPREHENSIVE METABOLIC PANEL
ALT: 8 U/L (ref 0–44)
ANION GAP: 12 (ref 5–15)
AST: 14 U/L — ABNORMAL LOW (ref 15–41)
Albumin: 2.6 g/dL — ABNORMAL LOW (ref 3.5–5.0)
Alkaline Phosphatase: 195 U/L — ABNORMAL HIGH (ref 38–126)
BUN: 13 mg/dL (ref 8–23)
CHLORIDE: 107 mmol/L (ref 98–111)
CO2: 29 mmol/L (ref 22–32)
CREATININE: 0.64 mg/dL (ref 0.44–1.00)
Calcium: 9.3 mg/dL (ref 8.9–10.3)
Glucose, Bld: 97 mg/dL (ref 70–99)
POTASSIUM: 3.9 mmol/L (ref 3.5–5.1)
Sodium: 148 mmol/L — ABNORMAL HIGH (ref 135–145)
Total Bilirubin: <0.2 mg/dL — ABNORMAL LOW (ref 0.3–1.2)
Total Protein: 6.5 g/dL (ref 6.5–8.1)

## 2017-12-21 LAB — CBC WITH DIFFERENTIAL/PLATELET
Basophils Absolute: 0.2 10*3/uL — ABNORMAL HIGH (ref 0.0–0.1)
Basophils Relative: 2 %
Eosinophils Absolute: 0.1 10*3/uL (ref 0.0–0.5)
Eosinophils Relative: 1 %
HCT: 35.2 % (ref 34.8–46.6)
HEMOGLOBIN: 11.8 g/dL (ref 11.6–15.9)
LYMPHS ABS: 1.6 10*3/uL (ref 0.9–3.3)
LYMPHS PCT: 20 %
MCH: 29.2 pg (ref 25.1–34.0)
MCHC: 33.5 g/dL (ref 31.5–36.0)
MCV: 87.1 fL (ref 79.5–101.0)
Monocytes Absolute: 0.4 10*3/uL (ref 0.1–0.9)
Monocytes Relative: 6 %
NEUTROS PCT: 71 %
Neutro Abs: 5.5 10*3/uL (ref 1.5–6.5)
Platelets: 513 10*3/uL — ABNORMAL HIGH (ref 145–400)
RBC: 4.04 MIL/uL (ref 3.70–5.45)
RDW: 15.1 % — ABNORMAL HIGH (ref 11.2–14.5)
WBC: 7.8 10*3/uL (ref 3.9–10.3)

## 2017-12-21 MED ORDER — SODIUM CHLORIDE 0.9 % IV SOLN
Freq: Once | INTRAVENOUS | Status: AC
  Administered 2017-12-21: 09:00:00 via INTRAVENOUS
  Filled 2017-12-21: qty 250

## 2017-12-21 MED ORDER — SODIUM CHLORIDE 0.9 % IV SOLN
480.0000 mg | Freq: Once | INTRAVENOUS | Status: AC
  Administered 2017-12-21: 480 mg via INTRAVENOUS
  Filled 2017-12-21: qty 48

## 2017-12-21 NOTE — Patient Instructions (Signed)
[facility] Discharge Instructions for Patients Receiving Chemotherapy  Today you received the following chemotherapy agents: Nivolumab  To help prevent nausea and vomiting after your treatment, we encourage you to take your nausea medication as directed.    If you develop nausea and vomiting that is not controlled by your nausea medication, call the clinic.   BELOW ARE SYMPTOMS THAT SHOULD BE REPORTED IMMEDIATELY:  *FEVER GREATER THAN 100.5 F  *CHILLS WITH OR WITHOUT FEVER  NAUSEA AND VOMITING THAT IS NOT CONTROLLED WITH YOUR NAUSEA MEDICATION  *UNUSUAL SHORTNESS OF BREATH  *UNUSUAL BRUISING OR BLEEDING  TENDERNESS IN MOUTH AND THROAT WITH OR WITHOUT PRESENCE OF ULCERS  *URINARY PROBLEMS  *BOWEL PROBLEMS  UNUSUAL RASH Items with * indicate a potential emergency and should be followed up as soon as possible.  Feel free to call the clinic should you have any questions or concerns. The clinic phone number is [phone].  Please show the CHEMO ALERT CARD at check-in to the Emergency Department and triage nurse.   

## 2017-12-21 NOTE — Telephone Encounter (Signed)
Printed current schedule for patient.. Per 9/9 walk in

## 2018-01-07 ENCOUNTER — Ambulatory Visit: Payer: Medicare HMO | Admitting: Internal Medicine

## 2018-01-07 ENCOUNTER — Other Ambulatory Visit: Payer: Medicare HMO

## 2018-01-07 ENCOUNTER — Ambulatory Visit: Payer: Medicare HMO

## 2018-01-18 ENCOUNTER — Encounter: Payer: Self-pay | Admitting: Nurse Practitioner

## 2018-01-18 ENCOUNTER — Inpatient Hospital Stay: Payer: Medicare HMO | Attending: Oncology

## 2018-01-18 ENCOUNTER — Inpatient Hospital Stay (HOSPITAL_BASED_OUTPATIENT_CLINIC_OR_DEPARTMENT_OTHER): Payer: Medicare HMO | Admitting: Nurse Practitioner

## 2018-01-18 ENCOUNTER — Inpatient Hospital Stay: Payer: Medicare HMO

## 2018-01-18 ENCOUNTER — Telehealth: Payer: Self-pay | Admitting: Nurse Practitioner

## 2018-01-18 VITALS — BP 135/76 | HR 96 | Temp 98.1°F | Resp 18 | Ht 66.0 in | Wt 138.9 lb

## 2018-01-18 DIAGNOSIS — C3491 Malignant neoplasm of unspecified part of right bronchus or lung: Secondary | ICD-10-CM

## 2018-01-18 DIAGNOSIS — Z5112 Encounter for antineoplastic immunotherapy: Secondary | ICD-10-CM | POA: Insufficient documentation

## 2018-01-18 DIAGNOSIS — C342 Malignant neoplasm of middle lobe, bronchus or lung: Secondary | ICD-10-CM | POA: Insufficient documentation

## 2018-01-18 DIAGNOSIS — Z79899 Other long term (current) drug therapy: Secondary | ICD-10-CM | POA: Diagnosis not present

## 2018-01-18 LAB — CMP (CANCER CENTER ONLY)
ALT: 8 U/L (ref 0–44)
ANION GAP: 8 (ref 5–15)
AST: 15 U/L (ref 15–41)
Albumin: 3.4 g/dL — ABNORMAL LOW (ref 3.5–5.0)
Alkaline Phosphatase: 236 U/L — ABNORMAL HIGH (ref 38–126)
BUN: 13 mg/dL (ref 8–23)
CHLORIDE: 108 mmol/L (ref 98–111)
CO2: 27 mmol/L (ref 22–32)
Calcium: 9.4 mg/dL (ref 8.9–10.3)
Creatinine: 0.68 mg/dL (ref 0.44–1.00)
GFR, Estimated: >60 mL/min (ref >60)
Glucose, Bld: 77 mg/dL (ref 70–99)
POTASSIUM: 3.8 mmol/L (ref 3.5–5.1)
SODIUM: 143 mmol/L (ref 135–145)
Total Bilirubin: <0.2 mg/dL — ABNORMAL LOW (ref 0.3–1.2)
Total Protein: 7 g/dL (ref 6.5–8.1)

## 2018-01-18 LAB — CBC WITH DIFFERENTIAL (CANCER CENTER ONLY)
BASOS PCT: 1 %
Basophils Absolute: 0.1 10*3/uL (ref 0.0–0.1)
EOS ABS: 0.1 10*3/uL (ref 0.0–0.5)
Eosinophils Relative: 2 %
HCT: 37.9 % (ref 34.8–46.6)
Hemoglobin: 12.8 g/dL (ref 11.6–15.9)
LYMPHS ABS: 1.4 10*3/uL (ref 0.9–3.3)
Lymphocytes Relative: 22 %
MCH: 29.1 pg (ref 25.1–34.0)
MCHC: 33.7 g/dL (ref 31.5–36.0)
MCV: 86.4 fL (ref 79.5–101.0)
Monocytes Absolute: 0.4 10*3/uL (ref 0.1–0.9)
Monocytes Relative: 7 %
NEUTROS PCT: 68 %
Neutro Abs: 4.3 10*3/uL (ref 1.5–6.5)
Platelet Count: 256 10*3/uL (ref 145–400)
RBC: 4.39 MIL/uL (ref 3.70–5.45)
RDW: 15.7 % — AB (ref 11.2–14.5)
WBC Count: 6.3 10*3/uL (ref 3.9–10.3)

## 2018-01-18 MED ORDER — SODIUM CHLORIDE 0.9 % IV SOLN
Freq: Once | INTRAVENOUS | Status: AC
  Administered 2018-01-18: 15:00:00 via INTRAVENOUS
  Filled 2018-01-18: qty 250

## 2018-01-18 MED ORDER — SODIUM CHLORIDE 0.9 % IV SOLN
480.0000 mg | Freq: Once | INTRAVENOUS | Status: AC
  Administered 2018-01-18: 480 mg via INTRAVENOUS
  Filled 2018-01-18: qty 48

## 2018-01-18 NOTE — Progress Notes (Signed)
  [facility] OFFICE PROGRESS NOTE   DIAGNOSIS:Stage IIIA (T1b, N2, M0) non-small cell lung cancer presented with right middle lobe pulmonary nodule, mediastinal lymphadenopathy and highly suspicious for small nodule in the left upper lobe that could change her stage to stage IV that could present another synchronous primary lesion in the left upper lobe. This was diagnosed in September 2017.  PRIOR THERAPY:  1) Concurrent chemoradiation with weekly carboplatin for AUC of 2 and paclitaxel 45 MG/M2 status post 6 cycles last dose was given 02/25/2016 with partial response. 2) Consolidation chemotherapy with carboplatin for AUC of 5 and paclitaxel 175 MG/M2 every 3 weeks with Neulasta support. First dose 04/22/2016. Status post 3 cycles.  CURRENT THERAPY: Second line treatment with immunotherapy with Nivolumab 480 mg IV every 4 weeks status post9cycles.    INTERVAL HISTORY:   Courtney Zuniga returns as scheduled.  She completed cycle 9 nivolumab 12/21/2017.  She denies nausea/vomiting.  No mouth sores.  No diarrhea.  No rash.  Cough is better.  She reports recent nasal congestion.  No fever.  No change in baseline dyspnea on exertion.  Objective:  Vital signs in last 24 hours:  Blood pressure 135/76, pulse 96, temperature 98.1 F (36.7 C), temperature source Oral, resp. rate 18, height 5\' 6"  (1.676 m), weight 138 lb 14.4 oz (63 kg), SpO2 97 %.    HEENT: No thrush or ulcers. Resp: Distant breath sounds.  No respiratory distress. Cardio: Regular rate and rhythm. GI: Abdomen soft and nontender.  No hepatomegaly.   Vascular: No leg edema.  Skin: No rash.   Lab Results:  Lab Results  Component Value Date   WBC 6.3 01/18/2018   HGB 12.8 01/18/2018   HCT 37.9 01/18/2018   MCV 86.4 01/18/2018   PLT 256 01/18/2018   NEUTROABS 4.3 01/18/2018    Imaging:  No results found.  Medications: I have reviewed the patient's current medications.  Assessment/Plan: 1. Stage  IIIa non-small cell lung cancer status post concurrent chemoradiation followed by consolidation chemotherapy with carboplatin and Taxol for 3 cycles.  Subsequent restaging studies showed evidence for disease progression.  She began second line treatment with immunotherapy with nivolumab every 4 weeks and has completed 9 cycles to date.  Disposition: [name] appears stable.  She has completed 9 cycles of immunotherapy with nivolumab.  Plan to proceed with cycle 10 today as scheduled pending results from the chemistry panel.  She will return for lab, follow-up and the next cycle of nivolumab in 4 weeks.  She will contact the office in the interim with any problems.  Plan reviewed with Dr. Julien Nordmann.    Ned Card ANP/GNP-BC   01/18/2018  2:16 PM

## 2018-01-18 NOTE — Telephone Encounter (Signed)
No 10/7 los.

## 2018-01-19 LAB — TSH: TSH: 1.259 u[IU]/mL (ref 0.308–3.960)

## 2018-01-19 NOTE — Patient Instructions (Signed)
[facility] Discharge Instructions for Patients Receiving Chemotherapy  Today you received the following chemotherapy agents: Nivolumab  To help prevent nausea and vomiting after your treatment, we encourage you to take your nausea medication as directed.    If you develop nausea and vomiting that is not controlled by your nausea medication, call the clinic.   BELOW ARE SYMPTOMS THAT SHOULD BE REPORTED IMMEDIATELY:  *FEVER GREATER THAN 100.5 F  *CHILLS WITH OR WITHOUT FEVER  NAUSEA AND VOMITING THAT IS NOT CONTROLLED WITH YOUR NAUSEA MEDICATION  *UNUSUAL SHORTNESS OF BREATH  *UNUSUAL BRUISING OR BLEEDING  TENDERNESS IN MOUTH AND THROAT WITH OR WITHOUT PRESENCE OF ULCERS  *URINARY PROBLEMS  *BOWEL PROBLEMS  UNUSUAL RASH Items with * indicate a potential emergency and should be followed up as soon as possible.  Feel free to call the clinic should you have any questions or concerns. The clinic phone number is [phone].  Please show the CHEMO ALERT CARD at check-in to the Emergency Department and triage nurse.   

## 2018-02-04 ENCOUNTER — Ambulatory Visit: Payer: Medicare HMO | Admitting: Internal Medicine

## 2018-02-04 ENCOUNTER — Ambulatory Visit: Payer: Medicare HMO

## 2018-02-04 ENCOUNTER — Other Ambulatory Visit: Payer: Medicare HMO

## 2018-02-15 ENCOUNTER — Inpatient Hospital Stay: Payer: Medicare HMO

## 2018-02-15 ENCOUNTER — Telehealth: Payer: Self-pay | Admitting: Internal Medicine

## 2018-02-15 ENCOUNTER — Inpatient Hospital Stay (HOSPITAL_BASED_OUTPATIENT_CLINIC_OR_DEPARTMENT_OTHER): Payer: Medicare HMO | Admitting: Internal Medicine

## 2018-02-15 ENCOUNTER — Inpatient Hospital Stay: Payer: Medicare HMO | Attending: Oncology

## 2018-02-15 ENCOUNTER — Other Ambulatory Visit: Payer: Self-pay | Admitting: Internal Medicine

## 2018-02-15 ENCOUNTER — Encounter: Payer: Self-pay | Admitting: Internal Medicine

## 2018-02-15 VITALS — BP 106/77 | HR 93 | Temp 98.4°F | Resp 18 | Ht 66.0 in | Wt 143.3 lb

## 2018-02-15 DIAGNOSIS — I10 Essential (primary) hypertension: Secondary | ICD-10-CM

## 2018-02-15 DIAGNOSIS — Z79899 Other long term (current) drug therapy: Secondary | ICD-10-CM | POA: Insufficient documentation

## 2018-02-15 DIAGNOSIS — C342 Malignant neoplasm of middle lobe, bronchus or lung: Secondary | ICD-10-CM

## 2018-02-15 DIAGNOSIS — Z5112 Encounter for antineoplastic immunotherapy: Secondary | ICD-10-CM

## 2018-02-15 DIAGNOSIS — C3491 Malignant neoplasm of unspecified part of right bronchus or lung: Secondary | ICD-10-CM

## 2018-02-15 DIAGNOSIS — R05 Cough: Secondary | ICD-10-CM | POA: Diagnosis not present

## 2018-02-15 DIAGNOSIS — C3490 Malignant neoplasm of unspecified part of unspecified bronchus or lung: Secondary | ICD-10-CM

## 2018-02-15 LAB — CBC WITH DIFFERENTIAL (CANCER CENTER ONLY)
ABS IMMATURE GRANULOCYTES: 0.01 10*3/uL (ref 0.00–0.07)
BASOS ABS: 0 10*3/uL (ref 0.0–0.1)
Basophils Relative: 1 %
EOS PCT: 3 %
Eosinophils Absolute: 0.1 10*3/uL (ref 0.0–0.5)
HEMATOCRIT: 37.4 % (ref 36.0–46.0)
HEMOGLOBIN: 12.6 g/dL (ref 12.0–15.0)
Immature Granulocytes: 0 %
LYMPHS ABS: 1.5 10*3/uL (ref 0.7–4.0)
LYMPHS PCT: 28 %
MCH: 28.4 pg (ref 26.0–34.0)
MCHC: 33.7 g/dL (ref 30.0–36.0)
MCV: 84.2 fL (ref 80.0–100.0)
MONO ABS: 0.5 10*3/uL (ref 0.1–1.0)
Monocytes Relative: 9 %
NEUTROS ABS: 3 10*3/uL (ref 1.7–7.7)
Neutrophils Relative %: 59 %
Platelet Count: 250 10*3/uL (ref 150–400)
RBC: 4.44 MIL/uL (ref 3.87–5.11)
RDW: 15.3 % (ref 11.5–15.5)
WBC: 5.1 10*3/uL (ref 4.0–10.5)
nRBC: 0 % (ref 0.0–0.2)

## 2018-02-15 LAB — CMP (CANCER CENTER ONLY)
ALT: 11 U/L (ref 0–44)
AST: 17 U/L (ref 15–41)
Albumin: 3.3 g/dL — ABNORMAL LOW (ref 3.5–5.0)
Alkaline Phosphatase: 226 U/L — ABNORMAL HIGH (ref 38–126)
Anion gap: 9 (ref 5–15)
BUN: 11 mg/dL (ref 8–23)
CHLORIDE: 107 mmol/L (ref 98–111)
CO2: 28 mmol/L (ref 22–32)
CREATININE: 0.68 mg/dL (ref 0.44–1.00)
Calcium: 9.2 mg/dL (ref 8.9–10.3)
GFR, Est AFR Am: >60 mL/min (ref >60)
Glucose, Bld: 75 mg/dL (ref 70–99)
POTASSIUM: 4.1 mmol/L (ref 3.5–5.1)
SODIUM: 144 mmol/L (ref 135–145)
Total Bilirubin: <0.2 mg/dL — ABNORMAL LOW (ref 0.3–1.2)
Total Protein: 6.6 g/dL (ref 6.5–8.1)

## 2018-02-15 LAB — TSH: TSH: 1.41 u[IU]/mL (ref 0.308–3.960)

## 2018-02-15 MED ORDER — SODIUM CHLORIDE 0.9 % IV SOLN
Freq: Once | INTRAVENOUS | Status: AC
  Administered 2018-02-15: 11:00:00 via INTRAVENOUS
  Filled 2018-02-15: qty 250

## 2018-02-15 MED ORDER — SODIUM CHLORIDE 0.9 % IV SOLN
480.0000 mg | Freq: Once | INTRAVENOUS | Status: AC
  Administered 2018-02-15: 480 mg via INTRAVENOUS
  Filled 2018-02-15: qty 48

## 2018-02-15 NOTE — Telephone Encounter (Signed)
Scheduled appt per 11/4 los - pt to get an updated schedule next visit.

## 2018-02-15 NOTE — Progress Notes (Signed)
[place] [phone]   [phone]  OFFICE PROGRESS NOTE  Barry Dienes, NP No address on file  DIAGNOSIS: Stage IIIA (T1b, N2, M0) non-small cell lung cancer presented with right middle lobe pulmonary nodule, mediastinal lymphadenopathy and highly suspicious for small nodule in the left upper lobe that could change her stage to stage IV that could present another synchronous primary lesion in the left upper lobe. This was diagnosed in September 2017.  PRIOR THERAPY:  1) Concurrent chemoradiation with weekly carboplatin for AUC of 2 and paclitaxel 45 MG/M2 status post 6 cycles last dose was given 02/25/2016 with partial response. 2) Consolidation chemotherapy with carboplatin for AUC of 5 and paclitaxel 175 MG/M2 every 3 weeks with Neulasta support. First dose 04/22/2016. Status post 3 cycles.  CURRENT THERAPY: Second line treatment with immunotherapy with Nivolumab 480 mg IV every 4 weeks status post 10 cycles..  INTERVAL HISTORY: Courtney Zuniga 67 y.o. female returns to the clinic today for follow-up visit.  The patient is feeling fine today with no concerning complaints except for persistent cough.  She used Robitussin with no improvement.  She denied having any current chest pain, shortness of breath or hemoptysis.  She denied having any weight loss or night sweats.  She has no nausea, vomiting, diarrhea or constipation.  She continues to tolerate her treatment with Nivolumab fairly well.  She is here for evaluation before starting cycle #11.  MEDICAL HISTORY: Past Medical History:  Diagnosis Date  . Anemia    as a young woman  . Arthritis    osteoartritis  . Asthma   . Brain tumor (benign) ([place]) 2005 [facility]   Benign  . Chronic headaches   . Chronic hip pain   . Chronic pain   . COPD (chronic obstructive pulmonary disease) ([place])   . Coronary artery disease   . Depression   . Depression 05/15/2016  . Encounter for antineoplastic  chemotherapy 01/10/2016  . GERD (gastroesophageal reflux disease)   . Hypertension   . Lung cancer ([place]) dx'd 01/2016   currently on chemo and radiation   . NSTEMI (non-ST elevated myocardial infarction) ([place]) yrs ago  . On home O2    qhs 2 liters at hs and prn  . Pneumonia last time 2 yrs ago  . Shortness of breath dyspnea    with activity    ALLERGIES:  has No Known Allergies.  MEDICATIONS:  Current Outpatient Medications  Medication Sig Dispense Refill  . acetaminophen (TYLENOL) 500 MG tablet Take 500 mg by mouth every 6 (six) hours as needed for mild pain or moderate pain.    Marland Kitchen albuterol (PROVENTIL HFA;VENTOLIN HFA) 108 (90 BASE) MCG/ACT inhaler Inhale 2 puffs into the lungs every 4 (four) hours as needed for shortness of breath.     . Aspirin-Salicylamide-Caffeine (BC HEADACHE POWDER PO) Take 1 packet by mouth 2 (two) times daily as needed (headache).     Marland Kitchen BREO ELLIPTA 100-25 MCG/INH AEPB Inhale 1 puff into the lungs daily as needed.    . cyclobenzaprine (FLEXERIL) 10 MG tablet Take 10 mg by mouth 3 (three) times daily as needed for muscle spasms.     . diclofenac sodium (VOLTAREN) 1 % GEL Apply topically 4 (four) times daily as needed (massge gel into affected area(s) as needed for pain).   1  . etodolac (LODINE) 200 MG capsule Take 1 capsule (200 mg total) by mouth 3 (three) times daily as needed. Take with food.  30 capsule 0  . Famotidine 20 MG CHEW     . guaifenesin (ROBITUSSIN) 100 MG/5ML syrup Take 200 mg by mouth 3 (three) times daily as needed for cough.    Marland Kitchen levofloxacin (LEVAQUIN) 500 MG tablet Take 1.5 tablets (750 mg total) by mouth daily. X 7 days 7 tablet 0  . lidocaine-prilocaine (EMLA) cream Apply 1 application topically 4 (four) times daily. 2g up to 4 times daily  2  . metoprolol tartrate (LOPRESSOR) 25 MG tablet Take 1 tablet by mouth 2 (two) times daily.    Marland Kitchen omeprazole (PRILOSEC) 40 MG capsule   1  . oxyCODONE-acetaminophen (PERCOCET/ROXICET) 5-325 MG tablet    0  . Phenylephrine-Acetaminophen 5-325 MG TABS     . ROBITUSSIN 12 HOUR COUGH 30 MG/5ML liquid     . traMADol (ULTRAM) 50 MG tablet Take 1 tablet (50 mg total) by mouth every 6 (six) hours as needed for moderate pain. 20 tablet 0   No current facility-administered medications for this visit.     SURGICAL HISTORY:  Past Surgical History:  Procedure Laterality Date  . CHOLECYSTECTOMY    . COLONOSCOPY  2015   Results requested from [facility]  . COLONOSCOPY    . ESOPHAGOGASTRODUODENOSCOPY N/A 08/14/2015   Procedure: ESOPHAGOGASTRODUODENOSCOPY (EGD);  Surgeon: Daneil Dolin, MD;  Location: [facility];  Service: Endoscopy;  Laterality: N/A;  215   . ESOPHAGOGASTRODUODENOSCOPY (EGD) WITH PROPOFOL N/A 09/13/2015   Procedure: ESOPHAGOGASTRODUODENOSCOPY (EGD) WITH PROPOFOL;  Surgeon: Milus Banister, MD;  Location: [facility];  Service: Endoscopy;  Laterality: N/A;  . EUS N/A 03/12/2017   Procedure: UPPER ENDOSCOPIC ULTRASOUND (EUS) RADIAL;  Surgeon: Milus Banister, MD;  Location: [facility];  Service: Endoscopy;  Laterality: N/A;  . TUMOR REMOVAL  2005   Benign  . UPPER ESOPHAGEAL ENDOSCOPIC ULTRASOUND (EUS)  09/13/2015   Procedure: UPPER ESOPHAGEAL ENDOSCOPIC ULTRASOUND (EUS);  Surgeon: Milus Banister, MD;  Location: [facility];  Service: Endoscopy;;  . VIDEO BRONCHOSCOPY WITH ENDOBRONCHIAL NAVIGATION N/A 12/31/2015   Procedure: VIDEO BRONCHOSCOPY WITH ENDOBRONCHIAL NAVIGATION;  Surgeon: Melrose Nakayama, MD;  Location: [facility];  Service: Thoracic;  Laterality: N/A;  . VIDEO BRONCHOSCOPY WITH ENDOBRONCHIAL ULTRASOUND N/A 11/08/2015   Procedure: VIDEO BRONCHOSCOPY WITH ENDOBRONCHIAL ULTRASOUND;  Surgeon: Ivin Poot, MD;  Location: [facility] OR;  Service: Thoracic;  Laterality: N/A;    REVIEW OF SYSTEMS:  A comprehensive review of systems was negative except for: Respiratory: positive for cough   PHYSICAL EXAMINATION: General appearance: alert, cooperative and no  distress Head: Normocephalic, without obvious abnormality, atraumatic Neck: no adenopathy, no JVD, supple, symmetrical, trachea midline and thyroid not enlarged, symmetric, no tenderness/mass/nodules Lymph nodes: Cervical, supraclavicular, and axillary nodes normal. Resp: clear to auscultation bilaterally Back: symmetric, no curvature. ROM normal. No CVA tenderness. Cardio: regular rate and rhythm, S1, S2 normal, no murmur, click, rub or gallop GI: soft, non-tender; bowel sounds normal; no masses,  no organomegaly Extremities: extremities normal, atraumatic, no cyanosis or edema  ECOG PERFORMANCE STATUS: 1 - Symptomatic but completely ambulatory  Blood pressure 106/77, pulse 93, temperature 98.4 F (36.9 C), temperature source Oral, resp. rate 18, height 5\' 6"  (1.676 m), weight 143 lb 4.8 oz (65 kg), SpO2 96 %.  LABORATORY DATA: Lab Results  Component Value Date   WBC 5.1 02/15/2018   HGB 12.6 02/15/2018   HCT 37.4 02/15/2018   MCV 84.2 02/15/2018   PLT 250 02/15/2018      Chemistry  Component Value Date/Time   NA 144 02/15/2018 0926   NA 144 04/09/2017 0948   K 4.1 02/15/2018 0926   K 4.3 04/09/2017 0948   CL 107 02/15/2018 0926   CO2 28 02/15/2018 0926   CO2 25 04/09/2017 0948   BUN 11 02/15/2018 0926   BUN 16.8 04/09/2017 0948   CREATININE 0.68 02/15/2018 0926   CREATININE 0.8 04/09/2017 0948      Component Value Date/Time   CALCIUM 9.2 02/15/2018 0926   CALCIUM 9.1 04/09/2017 0948   ALKPHOS 226 (H) 02/15/2018 0926   ALKPHOS 216 (H) 04/09/2017 0948   AST 17 02/15/2018 0926   AST 12 04/09/2017 0948   ALT 11 02/15/2018 0926   ALT 13 04/09/2017 0948   BILITOT <0.2 (L) 02/15/2018 0926   BILITOT 0.22 04/09/2017 0948       RADIOGRAPHIC STUDIES: No results found.  ASSESSMENT AND PLAN:  This is a very pleasant 67 years old African-American female with a stage IIIA non-small cell lung cancer status post concurrent chemoradiation followed by consolidation  chemotherapy with carboplatin and paclitaxel for 3 cycles. The patient was followed by observation but restaging imaging studies showed evidence for disease progression. She was started on second line treatment with immunotherapy with Nivolumab 480 mg IV every 4 weeks status post 10 cycles.   The patient continues to tolerate this treatment well with no concerning adverse effects. I recommended for her to proceed with cycle #11 today as scheduled. I will see her back for follow-up visit in 4 weeks for evaluation with repeat CT scan of the chest, abdomen and pelvis for restaging of her disease before starting cycle #12. The patient was advised to call immediately if she has any concerning symptoms in the interval. The patient voices understanding of current disease status and treatment options and is in agreement with the current care plan. All questions were answered. The patient knows to call the clinic with any problems, questions or concerns. We can certainly see the patient much sooner if necessary.  Disclaimer: This note was dictated with voice recognition software. Similar sounding words can inadvertently be transcribed and may not be corrected upon review.

## 2018-02-15 NOTE — Patient Instructions (Signed)
[facility] Discharge Instructions for Patients Receiving Chemotherapy  Today you received the following chemotherapy agents:  Nivolumab  To help prevent nausea and vomiting after your treatment, we encourage you to take your nausea medication as prescribed.   If you develop nausea and vomiting that is not controlled by your nausea medication, call the clinic.   BELOW ARE SYMPTOMS THAT SHOULD BE REPORTED IMMEDIATELY:  *FEVER GREATER THAN 100.5 F  *CHILLS WITH OR WITHOUT FEVER  NAUSEA AND VOMITING THAT IS NOT CONTROLLED WITH YOUR NAUSEA MEDICATION  *UNUSUAL SHORTNESS OF BREATH  *UNUSUAL BRUISING OR BLEEDING  TENDERNESS IN MOUTH AND THROAT WITH OR WITHOUT PRESENCE OF ULCERS  *URINARY PROBLEMS  *BOWEL PROBLEMS  UNUSUAL RASH Items with * indicate a potential emergency and should be followed up as soon as possible.  Feel free to call the clinic should you have any questions or concerns. The clinic phone number is [phone].  Please show the CHEMO ALERT CARD at check-in to the Emergency Department and triage nurse.   

## 2018-03-04 ENCOUNTER — Telehealth: Payer: Self-pay | Admitting: Medical Oncology

## 2018-03-04 ENCOUNTER — Ambulatory Visit (HOSPITAL_COMMUNITY): Payer: Medicare HMO

## 2018-03-04 NOTE — Telephone Encounter (Signed)
Confirmed appt for next week.

## 2018-03-09 ENCOUNTER — Ambulatory Visit (HOSPITAL_COMMUNITY)
Admission: RE | Admit: 2018-03-09 | Discharge: 2018-03-09 | Disposition: A | Discharge status: Home / Self Care | Payer: Medicare HMO | Source: Ambulatory Visit | Attending: Internal Medicine | Admitting: Internal Medicine

## 2018-03-09 DIAGNOSIS — C3490 Malignant neoplasm of unspecified part of unspecified bronchus or lung: Secondary | ICD-10-CM | POA: Diagnosis not present

## 2018-03-09 MED ORDER — SODIUM CHLORIDE (PF) 0.9 % IJ SOLN
INTRAMUSCULAR | Status: AC
  Filled 2018-03-09: qty 50

## 2018-03-09 MED ORDER — IOHEXOL 300 MG/ML  SOLN
100.0000 mL | Freq: Once | INTRAMUSCULAR | Status: AC | PRN
  Administered 2018-03-09: 100 mL via INTRAVENOUS

## 2018-03-09 MED ORDER — IOHEXOL 300 MG/ML  SOLN: 30.0000 mL | Freq: Once | INTRAMUSCULAR | Status: DC | PRN

## 2018-03-12 ENCOUNTER — Ambulatory Visit (HOSPITAL_COMMUNITY): Payer: Medicare HMO

## 2018-03-15 ENCOUNTER — Encounter: Payer: Self-pay | Admitting: Internal Medicine

## 2018-03-15 ENCOUNTER — Inpatient Hospital Stay: Payer: Medicare HMO | Attending: Oncology

## 2018-03-15 ENCOUNTER — Inpatient Hospital Stay (HOSPITAL_BASED_OUTPATIENT_CLINIC_OR_DEPARTMENT_OTHER): Payer: Medicare HMO | Admitting: Internal Medicine

## 2018-03-15 ENCOUNTER — Telehealth: Payer: Self-pay | Admitting: Internal Medicine

## 2018-03-15 ENCOUNTER — Inpatient Hospital Stay: Payer: Medicare HMO

## 2018-03-15 VITALS — BP 119/88 | HR 102 | Temp 98.3°F | Resp 20 | Ht 66.0 in | Wt 143.8 lb

## 2018-03-15 DIAGNOSIS — M25561 Pain in right knee: Secondary | ICD-10-CM

## 2018-03-15 DIAGNOSIS — R0609 Other forms of dyspnea: Secondary | ICD-10-CM

## 2018-03-15 DIAGNOSIS — Z79899 Other long term (current) drug therapy: Secondary | ICD-10-CM | POA: Insufficient documentation

## 2018-03-15 DIAGNOSIS — C3491 Malignant neoplasm of unspecified part of right bronchus or lung: Secondary | ICD-10-CM

## 2018-03-15 DIAGNOSIS — C342 Malignant neoplasm of middle lobe, bronchus or lung: Secondary | ICD-10-CM | POA: Diagnosis present

## 2018-03-15 DIAGNOSIS — Z5112 Encounter for antineoplastic immunotherapy: Secondary | ICD-10-CM | POA: Diagnosis not present

## 2018-03-15 DIAGNOSIS — M25562 Pain in left knee: Secondary | ICD-10-CM

## 2018-03-15 LAB — CMP (CANCER CENTER ONLY)
ALT: 12 U/L (ref 0–44)
AST: 21 U/L (ref 15–41)
Albumin: 4 g/dL (ref 3.5–5.0)
Alkaline Phosphatase: 268 U/L — ABNORMAL HIGH (ref 38–126)
Anion gap: 12 (ref 5–15)
BUN: 21 mg/dL (ref 8–23)
CALCIUM: 9.8 mg/dL (ref 8.9–10.3)
CO2: 25 mmol/L (ref 22–32)
CREATININE: 0.9 mg/dL (ref 0.44–1.00)
Chloride: 104 mmol/L (ref 98–111)
Glucose, Bld: 75 mg/dL (ref 70–99)
Potassium: 4.1 mmol/L (ref 3.5–5.1)
SODIUM: 141 mmol/L (ref 135–145)
Total Bilirubin: 0.2 mg/dL — ABNORMAL LOW (ref 0.3–1.2)
Total Protein: 7.8 g/dL (ref 6.5–8.1)

## 2018-03-15 LAB — CBC WITH DIFFERENTIAL (CANCER CENTER ONLY)
Abs Immature Granulocytes: 0.02 10*3/uL (ref 0.00–0.07)
BASOS PCT: 0 %
Basophils Absolute: 0 10*3/uL (ref 0.0–0.1)
EOS ABS: 0.1 10*3/uL (ref 0.0–0.5)
EOS PCT: 2 %
HCT: 43.6 % (ref 36.0–46.0)
Hemoglobin: 14.7 g/dL (ref 12.0–15.0)
Immature Granulocytes: 0 %
Lymphocytes Relative: 24 %
Lymphs Abs: 1.6 10*3/uL (ref 0.7–4.0)
MCH: 27.6 pg (ref 26.0–34.0)
MCHC: 33.7 g/dL (ref 30.0–36.0)
MCV: 82 fL (ref 80.0–100.0)
MONO ABS: 0.5 10*3/uL (ref 0.1–1.0)
Monocytes Relative: 7 %
NEUTROS ABS: 4.5 10*3/uL (ref 1.7–7.7)
Neutrophils Relative %: 67 %
PLATELETS: 235 10*3/uL (ref 150–400)
RBC: 5.32 MIL/uL — AB (ref 3.87–5.11)
RDW: 15.8 % — AB (ref 11.5–15.5)
WBC: 6.8 10*3/uL (ref 4.0–10.5)
nRBC: 0 % (ref 0.0–0.2)

## 2018-03-15 LAB — TSH: TSH: 6.551 u[IU]/mL — AB (ref 0.308–3.960)

## 2018-03-15 MED ORDER — SODIUM CHLORIDE 0.9 % IV SOLN
Freq: Once | INTRAVENOUS | Status: AC
  Administered 2018-03-15: 13:00:00 via INTRAVENOUS
  Filled 2018-03-15: qty 250

## 2018-03-15 MED ORDER — SODIUM CHLORIDE 0.9 % IV SOLN
480.0000 mg | Freq: Once | INTRAVENOUS | Status: AC
  Administered 2018-03-15: 480 mg via INTRAVENOUS
  Filled 2018-03-15: qty 48

## 2018-03-15 NOTE — Patient Instructions (Signed)
[facility] Discharge Instructions for Patients Receiving Chemotherapy  Today you received the following chemotherapy agents:  Nivolumab  To help prevent nausea and vomiting after your treatment, we encourage you to take your nausea medication as prescribed.   If you develop nausea and vomiting that is not controlled by your nausea medication, call the clinic.   BELOW ARE SYMPTOMS THAT SHOULD BE REPORTED IMMEDIATELY:  *FEVER GREATER THAN 100.5 F  *CHILLS WITH OR WITHOUT FEVER  NAUSEA AND VOMITING THAT IS NOT CONTROLLED WITH YOUR NAUSEA MEDICATION  *UNUSUAL SHORTNESS OF BREATH  *UNUSUAL BRUISING OR BLEEDING  TENDERNESS IN MOUTH AND THROAT WITH OR WITHOUT PRESENCE OF ULCERS  *URINARY PROBLEMS  *BOWEL PROBLEMS  UNUSUAL RASH Items with * indicate a potential emergency and should be followed up as soon as possible.  Feel free to call the clinic should you have any questions or concerns. The clinic phone number is [phone].  Please show the CHEMO ALERT CARD at check-in to the Emergency Department and triage nurse.   

## 2018-03-15 NOTE — Progress Notes (Signed)
[place] [phone]   [phone]  OFFICE PROGRESS NOTE  Barry Dienes, NP No address on file  DIAGNOSIS: Recurrent non-small cell lung cancer initially diagnosed as stage IIIA (T1b, N2, M0) non-small cell lung cancer presented with right middle lobe pulmonary nodule, mediastinal lymphadenopathy and highly suspicious for small nodule in the left upper lobe that could change her stage to stage IV that could present another synchronous primary lesion in the left upper lobe. This was diagnosed in September 2017.  PRIOR THERAPY:  1) Concurrent chemoradiation with weekly carboplatin for AUC of 2 and paclitaxel 45 MG/M2 status post 6 cycles last dose was given 02/25/2016 with partial response. 2) Consolidation chemotherapy with carboplatin for AUC of 5 and paclitaxel 175 MG/M2 every 3 weeks with Neulasta support. First dose 04/22/2016. Status post 3 cycles.  CURRENT THERAPY: Second line treatment with immunotherapy with Nivolumab 480 mg IV every 4 weeks status post 11 cycles..  INTERVAL HISTORY: Courtney Zuniga 67 y.o. female returns to the clinic today for follow-up visit.  The patient is feeling fine today with no specific complaints except for arthralgia in her knees.  She also has shortness of breath with exertion but no significant chest pain, cough or hemoptysis.  She denied having any fever or chills.  She has no nausea, vomiting, diarrhea or constipation.  The patient denied having any skin rash.  She has no headache or visual changes.  She is tolerating her treatment with Nivolumab fairly well.  The patient is here today for evaluation with repeat CT scan of the chest, abdomen and pelvis for restaging of her disease.   MEDICAL HISTORY: Past Medical History:  Diagnosis Date  . Anemia    as a young woman  . Arthritis    osteoartritis  . Asthma   . Brain tumor (benign) ([place]) 2005 [facility]   Benign  . Chronic headaches   . Chronic hip pain   .  Chronic pain   . COPD (chronic obstructive pulmonary disease) ([place])   . Coronary artery disease   . Depression   . Depression 05/15/2016  . Encounter for antineoplastic chemotherapy 01/10/2016  . GERD (gastroesophageal reflux disease)   . Hypertension   . Lung cancer ([place]) dx'd 01/2016   currently on chemo and radiation   . NSTEMI (non-ST elevated myocardial infarction) ([place]) yrs ago  . On home O2    qhs 2 liters at hs and prn  . Pneumonia last time 2 yrs ago  . Shortness of breath dyspnea    with activity    ALLERGIES:  has No Known Allergies.  MEDICATIONS:  Current Outpatient Medications  Medication Sig Dispense Refill  . acetaminophen (TYLENOL) 500 MG tablet Take 500 mg by mouth every 6 (six) hours as needed for mild pain or moderate pain.    Marland Kitchen albuterol (PROVENTIL HFA;VENTOLIN HFA) 108 (90 BASE) MCG/ACT inhaler Inhale 2 puffs into the lungs every 4 (four) hours as needed for shortness of breath.     . Aspirin-Salicylamide-Caffeine (BC HEADACHE POWDER PO) Take 1 packet by mouth 2 (two) times daily as needed (headache).     Marland Kitchen BREO ELLIPTA 100-25 MCG/INH AEPB Inhale 1 puff into the lungs daily as needed.    . cyclobenzaprine (FLEXERIL) 10 MG tablet Take 10 mg by mouth 3 (three) times daily as needed for muscle spasms.     . diclofenac sodium (VOLTAREN) 1 % GEL Apply topically 4 (four) times daily as needed (massge gel  into affected area(s) as needed for pain).   1  . etodolac (LODINE) 200 MG capsule Take 1 capsule (200 mg total) by mouth 3 (three) times daily as needed. Take with food. 30 capsule 0  . Famotidine 20 MG CHEW     . guaifenesin (ROBITUSSIN) 100 MG/5ML syrup Take 200 mg by mouth 3 (three) times daily as needed for cough.    Marland Kitchen levofloxacin (LEVAQUIN) 500 MG tablet Take 1.5 tablets (750 mg total) by mouth daily. X 7 days 7 tablet 0  . lidocaine-prilocaine (EMLA) cream Apply 1 application topically 4 (four) times daily. 2g up to 4 times daily  2  . metoprolol tartrate  (LOPRESSOR) 25 MG tablet Take 1 tablet by mouth 2 (two) times daily.    Marland Kitchen omeprazole (PRILOSEC) 40 MG capsule   1  . oxyCODONE-acetaminophen (PERCOCET/ROXICET) 5-325 MG tablet   0  . Phenylephrine-Acetaminophen 5-325 MG TABS     . ROBITUSSIN 12 HOUR COUGH 30 MG/5ML liquid     . traMADol (ULTRAM) 50 MG tablet Take 1 tablet (50 mg total) by mouth every 6 (six) hours as needed for moderate pain. 20 tablet 0   No current facility-administered medications for this visit.     SURGICAL HISTORY:  Past Surgical History:  Procedure Laterality Date  . CHOLECYSTECTOMY    . COLONOSCOPY  2015   [facility] requested from [facility]  . COLONOSCOPY    . ESOPHAGOGASTRODUODENOSCOPY N/A 08/14/2015   Procedure: ESOPHAGOGASTRODUODENOSCOPY (EGD);  Surgeon: Daneil Dolin, MD;  Location: [facility];  Service: Endoscopy;  Laterality: N/A;  215   . ESOPHAGOGASTRODUODENOSCOPY (EGD) WITH PROPOFOL N/A 09/13/2015   Procedure: ESOPHAGOGASTRODUODENOSCOPY (EGD) WITH PROPOFOL;  Surgeon: Milus Banister, MD;  Location: [facility];  Service: Endoscopy;  Laterality: N/A;  . EUS N/A 03/12/2017   Procedure: UPPER ENDOSCOPIC ULTRASOUND (EUS) RADIAL;  Surgeon: Milus Banister, MD;  Location: [facility];  Service: Endoscopy;  Laterality: N/A;  . TUMOR REMOVAL  2005   Benign  . UPPER ESOPHAGEAL ENDOSCOPIC ULTRASOUND (EUS)  09/13/2015   Procedure: UPPER ESOPHAGEAL ENDOSCOPIC ULTRASOUND (EUS);  Surgeon: Milus Banister, MD;  Location: [facility];  Service: Endoscopy;;  . VIDEO BRONCHOSCOPY WITH ENDOBRONCHIAL NAVIGATION N/A 12/31/2015   Procedure: VIDEO BRONCHOSCOPY WITH ENDOBRONCHIAL NAVIGATION;  Surgeon: Melrose Nakayama, MD;  Location: [facility];  Service: Thoracic;  Laterality: N/A;  . VIDEO BRONCHOSCOPY WITH ENDOBRONCHIAL ULTRASOUND N/A 11/08/2015   Procedure: VIDEO BRONCHOSCOPY WITH ENDOBRONCHIAL ULTRASOUND;  Surgeon: Ivin Poot, MD;  Location: [facility] OR;  Service: Thoracic;  Laterality: N/A;    REVIEW OF  SYSTEMS:  Constitutional: positive for fatigue Eyes: negative Ears, nose, mouth, throat, and face: negative Respiratory: positive for cough and dyspnea on exertion Cardiovascular: negative Gastrointestinal: negative Genitourinary:negative Integument/breast: negative Hematologic/lymphatic: negative Musculoskeletal:positive for arthralgias Neurological: negative Behavioral/Psych: negative Endocrine: negative Allergic/Immunologic: negative   PHYSICAL EXAMINATION: General appearance: alert, cooperative and no distress Head: Normocephalic, without obvious abnormality, atraumatic Neck: no adenopathy, no JVD, supple, symmetrical, trachea midline and thyroid not enlarged, symmetric, no tenderness/mass/nodules Lymph nodes: Cervical, supraclavicular, and axillary nodes normal. Resp: clear to auscultation bilaterally Back: symmetric, no curvature. ROM normal. No CVA tenderness. Cardio: regular rate and rhythm, S1, S2 normal, no murmur, click, rub or gallop GI: soft, non-tender; bowel sounds normal; no masses,  no organomegaly Extremities: extremities normal, atraumatic, no cyanosis or edema Neurologic: Alert and oriented X 3, normal strength and tone. Normal symmetric reflexes. Normal coordination and gait  ECOG PERFORMANCE STATUS: 1 -  Symptomatic but completely ambulatory  Blood pressure 119/88, pulse (!) 102, temperature 98.3 F (36.8 C), temperature source Oral, resp. rate 20, height 5\' 6"  (1.676 m), weight 143 lb 12.8 oz (65.2 kg), SpO2 98 %.  LABORATORY DATA: Lab Results  Component Value Date   WBC 6.8 03/15/2018   HGB 14.7 03/15/2018   HCT 43.6 03/15/2018   MCV 82.0 03/15/2018   PLT 235 03/15/2018      Chemistry      Component Value Date/Time   NA 144 02/15/2018 0926   NA 144 04/09/2017 0948   K 4.1 02/15/2018 0926   K 4.3 04/09/2017 0948   CL 107 02/15/2018 0926   CO2 28 02/15/2018 0926   CO2 25 04/09/2017 0948   BUN 11 02/15/2018 0926   BUN 16.8 04/09/2017 0948    CREATININE 0.68 02/15/2018 0926   CREATININE 0.8 04/09/2017 0948      Component Value Date/Time   CALCIUM 9.2 02/15/2018 0926   CALCIUM 9.1 04/09/2017 0948   ALKPHOS 226 (H) 02/15/2018 0926   ALKPHOS 216 (H) 04/09/2017 0948   AST 17 02/15/2018 0926   AST 12 04/09/2017 0948   ALT 11 02/15/2018 0926   ALT 13 04/09/2017 0948   BILITOT <0.2 (L) 02/15/2018 0926   BILITOT 0.22 04/09/2017 0948       RADIOGRAPHIC STUDIES: Ct Chest W Contrast  Result Date: 03/09/2018 CLINICAL DATA:  Non-small-cell lung cancer. EXAM: CT CHEST, ABDOMEN, AND PELVIS WITH CONTRAST TECHNIQUE: Multidetector CT imaging of the chest, abdomen and pelvis was performed following the standard protocol during bolus administration of intravenous contrast. CONTRAST:  138mL OMNIPAQUE IOHEXOL 300 MG/ML  SOLN COMPARISON:  Chest CT 12/07/2017. PET-CT 10/23/2015. FINDINGS: CT CHEST FINDINGS Cardiovascular: The heart size is normal. No substantial pericardial effusion. Coronary artery calcification is evident. Atherosclerotic calcification is noted in the wall of the thoracic aorta. Mediastinum/Nodes: No mediastinal lymphadenopathy. There is no hilar lymphadenopathy. The esophagus has normal imaging features. There is no axillary lymphadenopathy. Lungs/Pleura: The central tracheobronchial airways are patent. Centrilobular and paraseptal emphysema noted with bullous change in the apices, right greater than left. The relatively diffuse interstitial opacity and patchy/nodular airspace consolidation seen in the right upper lobe the have decreased with more focal consolidative change seen medially today. This more focal area creates a nodular component measuring 1.2 x 2.4 cm on image 31 of series 4. Persistent right middle lobe collapse is associated with stable scarring in the anterior right upper lobe. The irregular small nodule in the superior segment of the right lower lobe is unchanged. Scattered areas of architectural distortion identified  in the lungs bilaterally. Trace atelectasis posterior lingula is stable. No pleural effusion on today's exam. Musculoskeletal: No worrisome lytic or sclerotic osseous abnormality. CT ABDOMEN PELVIS FINDINGS Hepatobiliary: No focal abnormality within the liver parenchyma. Gallbladder surgically absent. No intrahepatic or extrahepatic biliary dilation. Pancreas: No focal mass lesion. No dilatation of the main duct. No intraparenchymal cyst. No peripancreatic edema. Spleen: No splenomegaly. No focal mass lesion. Adrenals/Urinary Tract: No adrenal nodule or mass. Kidneys unremarkable. No evidence for hydroureter. The urinary bladder appears normal for the degree of distention. Stomach/Bowel: Stomach is nondistended. No gastric wall thickening. No evidence of outlet obstruction. Duodenum is normally positioned as is the ligament of Treitz. No small bowel wall thickening. No small bowel dilatation. The terminal ileum is normal. The appendix is normal. No gross colonic mass. No colonic wall thickening. Vascular/Lymphatic: There is abdominal aortic atherosclerosis without aneurysm. There is no gastrohepatic or  hepatoduodenal ligament lymphadenopathy. No intraperitoneal or retroperitoneal lymphadenopathy. No pelvic sidewall lymphadenopathy. Reproductive: Calcified fibroids noted in the uterus. There is no adnexal mass. Other: No intraperitoneal free fluid. Musculoskeletal: No worrisome lytic or sclerotic osseous abnormality. IMPRESSION: 1. Interval evolution of the right upper lobe interstitial and patchy nodular opacity with resolution of the interstitial disease in most of the nodularity and interval development of a more focal consolidative opacity in the medial right upper lobe. Findings may reflect evolving post radiation change or sequelae from infection/inflammation. This lesion measures 12 x 24 mm and close attention on follow-up recommended as neoplasm cannot be excluded. 2. Persistent right middle lobe collapse,  obscuring the nodule seen on PET-CT of 10/23/2015. 3. No evidence for metastatic disease in the abdomen or pelvis. 4.  Emphysema. (ICD10-J43.9) 5.  Aortic Atherosclerois (ICD10-170.0) Electronically Signed   By: Misty Stanley M.D.   On: 03/09/2018 14:45   Ct Abdomen Pelvis W Contrast  Result Date: 03/09/2018 CLINICAL DATA:  Non-small-cell lung cancer. EXAM: CT CHEST, ABDOMEN, AND PELVIS WITH CONTRAST TECHNIQUE: Multidetector CT imaging of the chest, abdomen and pelvis was performed following the standard protocol during bolus administration of intravenous contrast. CONTRAST:  184mL OMNIPAQUE IOHEXOL 300 MG/ML  SOLN COMPARISON:  Chest CT 12/07/2017. PET-CT 10/23/2015. FINDINGS: CT CHEST FINDINGS Cardiovascular: The heart size is normal. No substantial pericardial effusion. Coronary artery calcification is evident. Atherosclerotic calcification is noted in the wall of the thoracic aorta. Mediastinum/Nodes: No mediastinal lymphadenopathy. There is no hilar lymphadenopathy. The esophagus has normal imaging features. There is no axillary lymphadenopathy. Lungs/Pleura: The central tracheobronchial airways are patent. Centrilobular and paraseptal emphysema noted with bullous change in the apices, right greater than left. The relatively diffuse interstitial opacity and patchy/nodular airspace consolidation seen in the right upper lobe the have decreased with more focal consolidative change seen medially today. This more focal area creates a nodular component measuring 1.2 x 2.4 cm on image 31 of series 4. Persistent right middle lobe collapse is associated with stable scarring in the anterior right upper lobe. The irregular small nodule in the superior segment of the right lower lobe is unchanged. Scattered areas of architectural distortion identified in the lungs bilaterally. Trace atelectasis posterior lingula is stable. No pleural effusion on today's exam. Musculoskeletal: No worrisome lytic or sclerotic osseous  abnormality. CT ABDOMEN PELVIS FINDINGS Hepatobiliary: No focal abnormality within the liver parenchyma. Gallbladder surgically absent. No intrahepatic or extrahepatic biliary dilation. Pancreas: No focal mass lesion. No dilatation of the main duct. No intraparenchymal cyst. No peripancreatic edema. Spleen: No splenomegaly. No focal mass lesion. Adrenals/Urinary Tract: No adrenal nodule or mass. Kidneys unremarkable. No evidence for hydroureter. The urinary bladder appears normal for the degree of distention. Stomach/Bowel: Stomach is nondistended. No gastric wall thickening. No evidence of outlet obstruction. Duodenum is normally positioned as is the ligament of Treitz. No small bowel wall thickening. No small bowel dilatation. The terminal ileum is normal. The appendix is normal. No gross colonic mass. No colonic wall thickening. Vascular/Lymphatic: There is abdominal aortic atherosclerosis without aneurysm. There is no gastrohepatic or hepatoduodenal ligament lymphadenopathy. No intraperitoneal or retroperitoneal lymphadenopathy. No pelvic sidewall lymphadenopathy. Reproductive: Calcified fibroids noted in the uterus. There is no adnexal mass. Other: No intraperitoneal free fluid. Musculoskeletal: No worrisome lytic or sclerotic osseous abnormality. IMPRESSION: 1. Interval evolution of the right upper lobe interstitial and patchy nodular opacity with resolution of the interstitial disease in most of the nodularity and interval development of a more focal consolidative opacity in the  medial right upper lobe. Findings may reflect evolving post radiation change or sequelae from infection/inflammation. This lesion measures 12 x 24 mm and close attention on follow-up recommended as neoplasm cannot be excluded. 2. Persistent right middle lobe collapse, obscuring the nodule seen on PET-CT of 10/23/2015. 3. No evidence for metastatic disease in the abdomen or pelvis. 4.  Emphysema. (ICD10-J43.9) 5.  Aortic Atherosclerois  (ICD10-170.0) Electronically Signed   By: Misty Stanley M.D.   On: 03/09/2018 14:45    ASSESSMENT AND PLAN:  This is a very pleasant 67 years old African-American female with a stage IIIA non-small cell lung cancer status post concurrent chemoradiation followed by consolidation chemotherapy with carboplatin and paclitaxel for 3 cycles. The patient was followed by observation but restaging imaging studies showed evidence for disease progression. She was started on second line treatment with immunotherapy with Nivolumab 480 mg IV every 4 weeks status post 11 cycles.   The patient continues to tolerate this treatment well with no concerning complaints. Repeat CT scan of the chest, abdomen and pelvis performed recently showed no concerning findings for disease progression but there was some evidence of inflammatory process in the lung that need close observation on upcoming imaging studies. I discussed the scan results with the patient and recommended for her to continue on her current treatment with nivolumab and she will proceed with cycle #12 today. I will see the patient back for follow-up visit in 4 weeks for evaluation before the next cycle of her treatment. She was advised to call immediately if she has any concerning symptoms in the interval. The patient voices understanding of current disease status and treatment options and is in agreement with the current care plan. All questions were answered. The patient knows to call the clinic with any problems, questions or concerns. We can certainly see the patient much sooner if necessary.  Disclaimer: This note was dictated with voice recognition software. Similar sounding words can inadvertently be transcribed and may not be corrected upon review.

## 2018-03-15 NOTE — Telephone Encounter (Signed)
Printed calendar and avs. °

## 2018-04-12 ENCOUNTER — Other Ambulatory Visit: Payer: Self-pay | Admitting: *Deleted

## 2018-04-12 ENCOUNTER — Inpatient Hospital Stay: Payer: Medicare HMO

## 2018-04-12 ENCOUNTER — Encounter: Payer: Self-pay | Admitting: Internal Medicine

## 2018-04-12 ENCOUNTER — Telehealth: Payer: Self-pay | Admitting: Internal Medicine

## 2018-04-12 ENCOUNTER — Inpatient Hospital Stay (HOSPITAL_BASED_OUTPATIENT_CLINIC_OR_DEPARTMENT_OTHER): Payer: Medicare HMO | Admitting: Internal Medicine

## 2018-04-12 VITALS — BP 115/73 | HR 96 | Temp 98.2°F | Resp 20 | Ht 66.0 in | Wt 141.5 lb

## 2018-04-12 DIAGNOSIS — C3491 Malignant neoplasm of unspecified part of right bronchus or lung: Secondary | ICD-10-CM

## 2018-04-12 DIAGNOSIS — R0609 Other forms of dyspnea: Secondary | ICD-10-CM

## 2018-04-12 DIAGNOSIS — C342 Malignant neoplasm of middle lobe, bronchus or lung: Secondary | ICD-10-CM | POA: Diagnosis not present

## 2018-04-12 DIAGNOSIS — Z5112 Encounter for antineoplastic immunotherapy: Secondary | ICD-10-CM | POA: Diagnosis not present

## 2018-04-12 DIAGNOSIS — I10 Essential (primary) hypertension: Secondary | ICD-10-CM

## 2018-04-12 LAB — CMP (CANCER CENTER ONLY)
ALBUMIN: 3.8 g/dL (ref 3.5–5.0)
ALT: 11 U/L (ref 0–44)
ANION GAP: 9 (ref 5–15)
AST: 18 U/L (ref 15–41)
Alkaline Phosphatase: 228 U/L — ABNORMAL HIGH (ref 38–126)
BUN: 17 mg/dL (ref 8–23)
CHLORIDE: 107 mmol/L (ref 98–111)
CO2: 26 mmol/L (ref 22–32)
Calcium: 9.5 mg/dL (ref 8.9–10.3)
Creatinine: 0.81 mg/dL (ref 0.44–1.00)
GFR, Est AFR Am: >60 mL/min (ref >60)
GFR, Estimated: >60 mL/min (ref >60)
GLUCOSE: 76 mg/dL (ref 70–99)
POTASSIUM: 4.2 mmol/L (ref 3.5–5.1)
Sodium: 142 mmol/L (ref 135–145)
Total Bilirubin: 0.3 mg/dL (ref 0.3–1.2)
Total Protein: 7 g/dL (ref 6.5–8.1)

## 2018-04-12 LAB — CBC WITH DIFFERENTIAL (CANCER CENTER ONLY)
ABS IMMATURE GRANULOCYTES: 0.01 10*3/uL (ref 0.00–0.07)
Basophils Absolute: 0 10*3/uL (ref 0.0–0.1)
Basophils Relative: 1 %
Eosinophils Absolute: 0.1 10*3/uL (ref 0.0–0.5)
Eosinophils Relative: 2 %
HCT: 42.2 % (ref 36.0–46.0)
HEMOGLOBIN: 14.3 g/dL (ref 12.0–15.0)
IMMATURE GRANULOCYTES: 0 %
LYMPHS PCT: 28 %
Lymphs Abs: 1.6 10*3/uL (ref 0.7–4.0)
MCH: 27.8 pg (ref 26.0–34.0)
MCHC: 33.9 g/dL (ref 30.0–36.0)
MCV: 81.9 fL (ref 80.0–100.0)
MONO ABS: 0.6 10*3/uL (ref 0.1–1.0)
MONOS PCT: 10 %
NEUTROS ABS: 3.6 10*3/uL (ref 1.7–7.7)
NEUTROS PCT: 59 %
Platelet Count: 248 10*3/uL (ref 150–400)
RBC: 5.15 MIL/uL — AB (ref 3.87–5.11)
RDW: 16.3 % — ABNORMAL HIGH (ref 11.5–15.5)
WBC Count: 5.9 10*3/uL (ref 4.0–10.5)
nRBC: 0 % (ref 0.0–0.2)

## 2018-04-12 MED ORDER — SODIUM CHLORIDE 0.9 % IV SOLN
480.0000 mg | Freq: Once | INTRAVENOUS | Status: AC
  Administered 2018-04-12: 480 mg via INTRAVENOUS
  Filled 2018-04-12: qty 48

## 2018-04-12 MED ORDER — SODIUM CHLORIDE 0.9 % IV SOLN
Freq: Once | INTRAVENOUS | Status: AC
  Administered 2018-04-12: 10:00:00 via INTRAVENOUS
  Filled 2018-04-12: qty 250

## 2018-04-12 NOTE — Progress Notes (Signed)
[place] [phone]   [phone]  OFFICE PROGRESS NOTE  Barry Dienes, NP No address on file  DIAGNOSIS: Recurrent non-small cell lung cancer initially diagnosed as stage IIIA (T1b, N2, M0) non-small cell lung cancer presented with right middle lobe pulmonary nodule, mediastinal lymphadenopathy and highly suspicious for small nodule in the left upper lobe that could change her stage to stage IV that could present another synchronous primary lesion in the left upper lobe. This was diagnosed in September 2017.  PRIOR THERAPY:  1) Concurrent chemoradiation with weekly carboplatin for AUC of 2 and paclitaxel 45 MG/M2 status post 6 cycles last dose was given 02/25/2016 with partial response. 2) Consolidation chemotherapy with carboplatin for AUC of 5 and paclitaxel 175 MG/M2 every 3 weeks with Neulasta support. First dose 04/22/2016. Status post 3 cycles.  CURRENT THERAPY: Second line treatment with immunotherapy with Nivolumab 480 mg IV every 4 weeks status post 12 cycles..  INTERVAL HISTORY: Courtney Zuniga 67 y.o. female returns to the clinic today for follow-up visit.  The patient is feeling fine today with no concerning complaints except for shortness of breath with exertion.  She denied having any chest pain, cough or hemoptysis.  She denied having any fever or chills.  She has no nausea, vomiting, diarrhea or constipation.  She has no significant skin rash.  She continues to tolerate her treatment with nivolumab fairly well.  The patient is here today for evaluation before starting cycle #13.   MEDICAL HISTORY: Past Medical History:  Diagnosis Date  . Anemia    as a young woman  . Arthritis    osteoartritis  . Asthma   . Brain tumor (benign) ([place]) 2005 [facility]   Benign  . Chronic headaches   . Chronic hip pain   . Chronic pain   . COPD (chronic obstructive pulmonary disease) ([place])   . Coronary artery disease   . Depression   .  Depression 05/15/2016  . Encounter for antineoplastic chemotherapy 01/10/2016  . GERD (gastroesophageal reflux disease)   . Hypertension   . Lung cancer ([place]) dx'd 01/2016   currently on chemo and radiation   . NSTEMI (non-ST elevated myocardial infarction) ([place]) yrs ago  . On home O2    qhs 2 liters at hs and prn  . Pneumonia last time 2 yrs ago  . Shortness of breath dyspnea    with activity    ALLERGIES:  has No Known Allergies.  MEDICATIONS:  Current Outpatient Medications  Medication Sig Dispense Refill  . acetaminophen (TYLENOL) 500 MG tablet Take 500 mg by mouth every 6 (six) hours as needed for mild pain or moderate pain.    Marland Kitchen albuterol (PROVENTIL HFA;VENTOLIN HFA) 108 (90 BASE) MCG/ACT inhaler Inhale 2 puffs into the lungs every 4 (four) hours as needed for shortness of breath.     . Aspirin-Salicylamide-Caffeine (BC HEADACHE POWDER PO) Take 1 packet by mouth 2 (two) times daily as needed (headache).     Marland Kitchen BREO ELLIPTA 100-25 MCG/INH AEPB Inhale 1 puff into the lungs daily as needed.    . cyclobenzaprine (FLEXERIL) 10 MG tablet Take 10 mg by mouth 3 (three) times daily as needed for muscle spasms.     . diclofenac sodium (VOLTAREN) 1 % GEL Apply topically 4 (four) times daily as needed (massge gel into affected area(s) as needed for pain).   1  . etodolac (LODINE) 200 MG capsule Take 1 capsule (200 mg total) by mouth  3 (three) times daily as needed. Take with food. 30 capsule 0  . Famotidine 20 MG CHEW     . guaifenesin (ROBITUSSIN) 100 MG/5ML syrup Take 200 mg by mouth 3 (three) times daily as needed for cough.    Marland Kitchen levofloxacin (LEVAQUIN) 500 MG tablet Take 1.5 tablets (750 mg total) by mouth daily. X 7 days 7 tablet 0  . lidocaine-prilocaine (EMLA) cream Apply 1 application topically 4 (four) times daily. 2g up to 4 times daily  2  . metoprolol tartrate (LOPRESSOR) 25 MG tablet Take 1 tablet by mouth 2 (two) times daily.    Marland Kitchen omeprazole (PRILOSEC) 40 MG capsule   1  .  oxyCODONE-acetaminophen (PERCOCET/ROXICET) 5-325 MG tablet   0  . Phenylephrine-Acetaminophen 5-325 MG TABS     . ROBITUSSIN 12 HOUR COUGH 30 MG/5ML liquid     . traMADol (ULTRAM) 50 MG tablet Take 1 tablet (50 mg total) by mouth every 6 (six) hours as needed for moderate pain. 20 tablet 0   No current facility-administered medications for this visit.     SURGICAL HISTORY:  Past Surgical History:  Procedure Laterality Date  . CHOLECYSTECTOMY    . COLONOSCOPY  2015   Results requested from [facility]  . COLONOSCOPY    . ESOPHAGOGASTRODUODENOSCOPY N/A 08/14/2015   Procedure: ESOPHAGOGASTRODUODENOSCOPY (EGD);  Surgeon: Daneil Dolin, MD;  Location: [facility];  Service: Endoscopy;  Laterality: N/A;  215   . ESOPHAGOGASTRODUODENOSCOPY (EGD) WITH PROPOFOL N/A 09/13/2015   Procedure: ESOPHAGOGASTRODUODENOSCOPY (EGD) WITH PROPOFOL;  Surgeon: Milus Banister, MD;  Location: [facility];  Service: Endoscopy;  Laterality: N/A;  . EUS N/A 03/12/2017   Procedure: UPPER ENDOSCOPIC ULTRASOUND (EUS) RADIAL;  Surgeon: Milus Banister, MD;  Location: [facility];  Service: Endoscopy;  Laterality: N/A;  . TUMOR REMOVAL  2005   Benign  . UPPER ESOPHAGEAL ENDOSCOPIC ULTRASOUND (EUS)  09/13/2015   Procedure: UPPER ESOPHAGEAL ENDOSCOPIC ULTRASOUND (EUS);  Surgeon: Milus Banister, MD;  Location: [facility];  Service: Endoscopy;;  . VIDEO BRONCHOSCOPY WITH ENDOBRONCHIAL NAVIGATION N/A 12/31/2015   Procedure: VIDEO BRONCHOSCOPY WITH ENDOBRONCHIAL NAVIGATION;  Surgeon: Melrose Nakayama, MD;  Location: [place];  Service: Thoracic;  Laterality: N/A;  . VIDEO BRONCHOSCOPY WITH ENDOBRONCHIAL ULTRASOUND N/A 11/08/2015   Procedure: VIDEO BRONCHOSCOPY WITH ENDOBRONCHIAL ULTRASOUND;  Surgeon: Ivin Poot, MD;  Location: [facility] OR;  Service: Thoracic;  Laterality: N/A;    REVIEW OF SYSTEMS:  A comprehensive review of systems was negative except for: Respiratory: positive for dyspnea on exertion    PHYSICAL EXAMINATION: General appearance: alert, cooperative and no distress Head: Normocephalic, without obvious abnormality, atraumatic Neck: no adenopathy, no JVD, supple, symmetrical, trachea midline and thyroid not enlarged, symmetric, no tenderness/mass/nodules Lymph nodes: Cervical, supraclavicular, and axillary nodes normal. Resp: clear to auscultation bilaterally Back: symmetric, no curvature. ROM normal. No CVA tenderness. Cardio: regular rate and rhythm, S1, S2 normal, no murmur, click, rub or gallop GI: soft, non-tender; bowel sounds normal; no masses,  no organomegaly Extremities: extremities normal, atraumatic, no cyanosis or edema  ECOG PERFORMANCE STATUS: 1 - Symptomatic but completely ambulatory  Blood pressure 115/73, pulse 96, temperature 98.2 F (36.8 C), temperature source Oral, resp. rate 20, height 5\' 6"  (1.676 m), weight 141 lb 8 oz (64.2 kg), SpO2 98 %.  LABORATORY DATA: Lab Results  Component Value Date   WBC 5.9 04/12/2018   HGB 14.3 04/12/2018   HCT 42.2 04/12/2018   MCV 81.9 04/12/2018   PLT  248 04/12/2018      Chemistry      Component Value Date/Time   NA 142 04/12/2018 0819   NA 144 04/09/2017 0948   K 4.2 04/12/2018 0819   K 4.3 04/09/2017 0948   CL 107 04/12/2018 0819   CO2 26 04/12/2018 0819   CO2 25 04/09/2017 0948   BUN 17 04/12/2018 0819   BUN 16.8 04/09/2017 0948   CREATININE 0.81 04/12/2018 0819   CREATININE 0.8 04/09/2017 0948      Component Value Date/Time   CALCIUM 9.5 04/12/2018 0819   CALCIUM 9.1 04/09/2017 0948   ALKPHOS 228 (H) 04/12/2018 0819   ALKPHOS 216 (H) 04/09/2017 0948   AST 18 04/12/2018 0819   AST 12 04/09/2017 0948   ALT 11 04/12/2018 0819   ALT 13 04/09/2017 0948   BILITOT 0.3 04/12/2018 0819   BILITOT 0.22 04/09/2017 0948       RADIOGRAPHIC STUDIES: No results found.  ASSESSMENT AND PLAN:  This is a very pleasant 67 years old African-American female with a stage IIIA non-small cell lung cancer  status post concurrent chemoradiation followed by consolidation chemotherapy with carboplatin and paclitaxel for 3 cycles. The patient was followed by observation but restaging imaging studies showed evidence for disease progression. She was started on second line treatment with immunotherapy with Nivolumab 480 mg IV every 4 weeks status post 12 cycles.   She continues to tolerate this treatment well with no concerning adverse effects. I recommended for the patient to proceed with cycle #13 today as scheduled. I will see her back for follow-up visit in 4 weeks for evaluation before starting cycle #14. She was advised to call immediately if she has any concerning symptoms in the interval. The patient voices understanding of current disease status and treatment options and is in agreement with the current care plan. All questions were answered. The patient knows to call the clinic with any problems, questions or concerns. We can certainly see the patient much sooner if necessary.  Disclaimer: This note was dictated with voice recognition software. Similar sounding words can inadvertently be transcribed and may not be corrected upon review.

## 2018-04-12 NOTE — Telephone Encounter (Signed)
Patient already on schedule as requested per 12/30 los for lab/fu/tx q4 thru end of February. Gave  Patient schedule for January - February.

## 2018-04-12 NOTE — Patient Instructions (Signed)
[facility] Discharge Instructions for Patients Receiving Chemotherapy  Today you received the following chemotherapy agents:  Nivolumab  To help prevent nausea and vomiting after your treatment, we encourage you to take your nausea medication as prescribed.   If you develop nausea and vomiting that is not controlled by your nausea medication, call the clinic.   BELOW ARE SYMPTOMS THAT SHOULD BE REPORTED IMMEDIATELY:  *FEVER GREATER THAN 100.5 F  *CHILLS WITH OR WITHOUT FEVER  NAUSEA AND VOMITING THAT IS NOT CONTROLLED WITH YOUR NAUSEA MEDICATION  *UNUSUAL SHORTNESS OF BREATH  *UNUSUAL BRUISING OR BLEEDING  TENDERNESS IN MOUTH AND THROAT WITH OR WITHOUT PRESENCE OF ULCERS  *URINARY PROBLEMS  *BOWEL PROBLEMS  UNUSUAL RASH Items with * indicate a potential emergency and should be followed up as soon as possible.  Feel free to call the clinic should you have any questions or concerns. The clinic phone number is [phone].  Please show the CHEMO ALERT CARD at check-in to the Emergency Department and triage nurse.   

## 2018-05-05 ENCOUNTER — Emergency Department (HOSPITAL_COMMUNITY): Payer: Medicare HMO

## 2018-05-05 ENCOUNTER — Encounter (HOSPITAL_COMMUNITY): Payer: Self-pay | Admitting: *Deleted

## 2018-05-05 ENCOUNTER — Inpatient Hospital Stay (HOSPITAL_COMMUNITY)
Admission: EM | Admit: 2018-05-05 | Discharge: 2018-05-08 | DRG: 193 | Disposition: A | Discharge status: Home / Self Care | Payer: Medicare HMO | Attending: Family Medicine | Admitting: Family Medicine

## 2018-05-05 ENCOUNTER — Other Ambulatory Visit: Payer: Self-pay

## 2018-05-05 DIAGNOSIS — J181 Lobar pneumonia, unspecified organism: Secondary | ICD-10-CM | POA: Diagnosis not present

## 2018-05-05 DIAGNOSIS — K219 Gastro-esophageal reflux disease without esophagitis: Secondary | ICD-10-CM | POA: Diagnosis present

## 2018-05-05 DIAGNOSIS — Z808 Family history of malignant neoplasm of other organs or systems: Secondary | ICD-10-CM

## 2018-05-05 DIAGNOSIS — J9621 Acute and chronic respiratory failure with hypoxia: Secondary | ICD-10-CM | POA: Diagnosis present

## 2018-05-05 DIAGNOSIS — M549 Dorsalgia, unspecified: Secondary | ICD-10-CM | POA: Diagnosis present

## 2018-05-05 DIAGNOSIS — J9611 Chronic respiratory failure with hypoxia: Secondary | ICD-10-CM

## 2018-05-05 DIAGNOSIS — C3491 Malignant neoplasm of unspecified part of right bronchus or lung: Secondary | ICD-10-CM | POA: Diagnosis not present

## 2018-05-05 DIAGNOSIS — A419 Sepsis, unspecified organism: Secondary | ICD-10-CM

## 2018-05-05 DIAGNOSIS — Z8041 Family history of malignant neoplasm of ovary: Secondary | ICD-10-CM

## 2018-05-05 DIAGNOSIS — M51369 Other intervertebral disc degeneration, lumbar region without mention of lumbar back pain or lower extremity pain: Secondary | ICD-10-CM | POA: Diagnosis present

## 2018-05-05 DIAGNOSIS — R0602 Shortness of breath: Secondary | ICD-10-CM | POA: Diagnosis not present

## 2018-05-05 DIAGNOSIS — J189 Pneumonia, unspecified organism: Principal | ICD-10-CM | POA: Diagnosis present

## 2018-05-05 DIAGNOSIS — Z923 Personal history of irradiation: Secondary | ICD-10-CM

## 2018-05-05 DIAGNOSIS — F329 Major depressive disorder, single episode, unspecified: Secondary | ICD-10-CM | POA: Diagnosis present

## 2018-05-05 DIAGNOSIS — I2510 Atherosclerotic heart disease of native coronary artery without angina pectoris: Secondary | ICD-10-CM | POA: Diagnosis present

## 2018-05-05 DIAGNOSIS — J441 Chronic obstructive pulmonary disease with (acute) exacerbation: Secondary | ICD-10-CM | POA: Diagnosis present

## 2018-05-05 DIAGNOSIS — M5136 Other intervertebral disc degeneration, lumbar region: Secondary | ICD-10-CM

## 2018-05-05 DIAGNOSIS — Z79899 Other long term (current) drug therapy: Secondary | ICD-10-CM

## 2018-05-05 DIAGNOSIS — Z801 Family history of malignant neoplasm of trachea, bronchus and lung: Secondary | ICD-10-CM

## 2018-05-05 DIAGNOSIS — G8929 Other chronic pain: Secondary | ICD-10-CM | POA: Diagnosis present

## 2018-05-05 DIAGNOSIS — I252 Old myocardial infarction: Secondary | ICD-10-CM

## 2018-05-05 DIAGNOSIS — I10 Essential (primary) hypertension: Secondary | ICD-10-CM | POA: Diagnosis present

## 2018-05-05 DIAGNOSIS — T451X5A Adverse effect of antineoplastic and immunosuppressive drugs, initial encounter: Secondary | ICD-10-CM | POA: Diagnosis present

## 2018-05-05 DIAGNOSIS — J440 Chronic obstructive pulmonary disease with acute lower respiratory infection: Secondary | ICD-10-CM | POA: Diagnosis present

## 2018-05-05 DIAGNOSIS — Z9981 Dependence on supplemental oxygen: Secondary | ICD-10-CM

## 2018-05-05 DIAGNOSIS — F17210 Nicotine dependence, cigarettes, uncomplicated: Secondary | ICD-10-CM | POA: Diagnosis present

## 2018-05-05 DIAGNOSIS — Z7982 Long term (current) use of aspirin: Secondary | ICD-10-CM

## 2018-05-05 DIAGNOSIS — Y95 Nosocomial condition: Secondary | ICD-10-CM | POA: Diagnosis present

## 2018-05-05 DIAGNOSIS — Z9221 Personal history of antineoplastic chemotherapy: Secondary | ICD-10-CM

## 2018-05-05 DIAGNOSIS — Z791 Long term (current) use of non-steroidal anti-inflammatories (NSAID): Secondary | ICD-10-CM

## 2018-05-05 DIAGNOSIS — J9610 Chronic respiratory failure, unspecified whether with hypoxia or hypercapnia: Secondary | ICD-10-CM

## 2018-05-05 DIAGNOSIS — R6520 Severe sepsis without septic shock: Secondary | ICD-10-CM

## 2018-05-05 LAB — CBC WITH DIFFERENTIAL/PLATELET
Abs Immature Granulocytes: 0.05 10*3/uL (ref 0.00–0.07)
Basophils Absolute: 0 10*3/uL (ref 0.0–0.1)
Basophils Relative: 0 %
Eosinophils Absolute: 0.1 10*3/uL (ref 0.0–0.5)
Eosinophils Relative: 1 %
HCT: 40.9 % (ref 36.0–46.0)
Hemoglobin: 13.6 g/dL (ref 12.0–15.0)
IMMATURE GRANULOCYTES: 1 %
Lymphocytes Relative: 15 %
Lymphs Abs: 1.4 10*3/uL (ref 0.7–4.0)
MCH: 27.8 pg (ref 26.0–34.0)
MCHC: 33.3 g/dL (ref 30.0–36.0)
MCV: 83.5 fL (ref 80.0–100.0)
MONOS PCT: 8 %
Monocytes Absolute: 0.7 10*3/uL (ref 0.1–1.0)
Neutro Abs: 7.1 10*3/uL (ref 1.7–7.7)
Neutrophils Relative %: 75 %
Platelets: 257 10*3/uL (ref 150–400)
RBC: 4.9 MIL/uL (ref 3.87–5.11)
RDW: 16.8 % — ABNORMAL HIGH (ref 11.5–15.5)
WBC: 9.4 10*3/uL (ref 4.0–10.5)
nRBC: 0 % (ref 0.0–0.2)

## 2018-05-05 LAB — TROPONIN I
Troponin I: <0.03 ng/mL (ref <0.03)
Troponin I: <0.03 ng/mL (ref <0.03)

## 2018-05-05 LAB — LACTIC ACID, PLASMA
LACTIC ACID, VENOUS: 1.3 mmol/L (ref 0.5–1.9)
Lactic Acid, Venous: 1 mmol/L (ref 0.5–1.9)

## 2018-05-05 LAB — COMPREHENSIVE METABOLIC PANEL
ALT: 19 U/L (ref 0–44)
AST: 27 U/L (ref 15–41)
Albumin: 4 g/dL (ref 3.5–5.0)
Alkaline Phosphatase: 191 U/L — ABNORMAL HIGH (ref 38–126)
Anion gap: 11 (ref 5–15)
BUN: 10 mg/dL (ref 8–23)
CO2: 26 mmol/L (ref 22–32)
Calcium: 9.1 mg/dL (ref 8.9–10.3)
Chloride: 100 mmol/L (ref 98–111)
Creatinine, Ser: 0.56 mg/dL (ref 0.44–1.00)
GFR calc non Af Amer: >60 mL/min (ref >60)
Glucose, Bld: 95 mg/dL (ref 70–99)
Potassium: 3.7 mmol/L (ref 3.5–5.1)
Sodium: 137 mmol/L (ref 135–145)
Total Bilirubin: 0.4 mg/dL (ref 0.3–1.2)
Total Protein: 7.8 g/dL (ref 6.5–8.1)

## 2018-05-05 LAB — INFLUENZA PANEL BY PCR (TYPE A & B)
Influenza A By PCR: NEGATIVE
Influenza B By PCR: NEGATIVE

## 2018-05-05 LAB — PROTIME-INR
INR: 0.94
Prothrombin Time: 12.5 seconds (ref 11.4–15.2)

## 2018-05-05 MED ORDER — IOPAMIDOL (ISOVUE-370) INJECTION 76%
100.0000 mL | Freq: Once | INTRAVENOUS | Status: AC | PRN
  Administered 2018-05-05: 100 mL via INTRAVENOUS

## 2018-05-05 MED ORDER — IPRATROPIUM-ALBUTEROL 0.5-2.5 (3) MG/3ML IN SOLN
3.0000 mL | Freq: Four times a day (QID) | RESPIRATORY_TRACT | Status: DC
  Administered 2018-05-06 – 2018-05-08 (×9): 3 mL via RESPIRATORY_TRACT
  Filled 2018-05-05 (×8): qty 3

## 2018-05-05 MED ORDER — CYCLOBENZAPRINE HCL 10 MG PO TABS
10.0000 mg | ORAL_TABLET | Freq: Two times a day (BID) | ORAL | Status: DC
  Administered 2018-05-06 – 2018-05-08 (×6): 10 mg via ORAL
  Filled 2018-05-05 (×6): qty 1

## 2018-05-05 MED ORDER — ENSURE ENLIVE PO LIQD
237.0000 mL | Freq: Three times a day (TID) | ORAL | Status: DC
  Administered 2018-05-06 – 2018-05-08 (×7): 237 mL via ORAL

## 2018-05-05 MED ORDER — PREDNISONE 20 MG PO TABS
40.0000 mg | ORAL_TABLET | Freq: Every day | ORAL | Status: DC
  Administered 2018-05-06 – 2018-05-08 (×3): 40 mg via ORAL
  Filled 2018-05-05 (×3): qty 2

## 2018-05-05 MED ORDER — SODIUM CHLORIDE 0.9 % IV BOLUS
1000.0000 mL | Freq: Once | INTRAVENOUS | Status: AC
  Administered 2018-05-05: 1000 mL via INTRAVENOUS

## 2018-05-05 MED ORDER — DEXTROMETHORPHAN POLISTIREX ER 30 MG/5ML PO SUER: 30.0000 mg | ORAL | Status: DC | PRN

## 2018-05-05 MED ORDER — LEVOFLOXACIN IN D5W 500 MG/100ML IV SOLN
500.0000 mg | Freq: Once | INTRAVENOUS | Status: AC
  Administered 2018-05-05: 500 mg via INTRAVENOUS
  Filled 2018-05-05: qty 100

## 2018-05-05 MED ORDER — METHYLPREDNISOLONE SODIUM SUCC 40 MG IJ SOLR
40.0000 mg | Freq: Once | INTRAMUSCULAR | Status: AC
  Administered 2018-05-06: 40 mg via INTRAVENOUS
  Filled 2018-05-05: qty 1

## 2018-05-05 MED ORDER — METOPROLOL TARTRATE 25 MG PO TABS
25.0000 mg | ORAL_TABLET | Freq: Two times a day (BID) | ORAL | Status: DC
  Administered 2018-05-06 – 2018-05-08 (×6): 25 mg via ORAL
  Filled 2018-05-05 (×7): qty 1

## 2018-05-05 MED ORDER — VANCOMYCIN HCL 10 G IV SOLR
1500.0000 mg | Freq: Once | INTRAVENOUS | Status: AC
  Administered 2018-05-06: 1500 mg via INTRAVENOUS
  Filled 2018-05-05 (×2): qty 1500

## 2018-05-05 MED ORDER — LEVALBUTEROL HCL 0.63 MG/3ML IN NEBU: 0.6300 mg | INHALATION_SOLUTION | Freq: Four times a day (QID) | RESPIRATORY_TRACT | Status: DC | PRN

## 2018-05-05 MED ORDER — FLUTICASONE FUROATE-VILANTEROL 100-25 MCG/INH IN AEPB
1.0000 | INHALATION_SPRAY | Freq: Every day | RESPIRATORY_TRACT | Status: DC
  Administered 2018-05-06 – 2018-05-08 (×3): 1 via RESPIRATORY_TRACT
  Filled 2018-05-05: qty 28

## 2018-05-05 MED ORDER — OXYCODONE-ACETAMINOPHEN 5-325 MG PO TABS
1.0000 | ORAL_TABLET | Freq: Every day | ORAL | Status: DC | PRN
  Administered 2018-05-06 – 2018-05-07 (×2): 1 via ORAL
  Filled 2018-05-05 (×2): qty 1

## 2018-05-05 MED ORDER — VITAMIN D 25 MCG (1000 UNIT) PO TABS
5000.0000 [IU] | ORAL_TABLET | Freq: Every day | ORAL | Status: DC
  Administered 2018-05-06 – 2018-05-08 (×3): 5000 [IU] via ORAL
  Filled 2018-05-05 (×3): qty 5

## 2018-05-05 MED ORDER — NICOTINE 14 MG/24HR TD PT24
14.0000 mg | MEDICATED_PATCH | Freq: Every day | TRANSDERMAL | Status: DC
  Administered 2018-05-06 – 2018-05-08 (×3): 14 mg via TRANSDERMAL
  Filled 2018-05-05 (×3): qty 1

## 2018-05-05 MED ORDER — PANTOPRAZOLE SODIUM 40 MG PO TBEC
40.0000 mg | DELAYED_RELEASE_TABLET | Freq: Every day | ORAL | Status: DC
  Administered 2018-05-06 – 2018-05-08 (×3): 40 mg via ORAL
  Filled 2018-05-05 (×3): qty 1

## 2018-05-05 MED ORDER — SODIUM CHLORIDE 0.9 % IV SOLN
1.0000 g | Freq: Three times a day (TID) | INTRAVENOUS | Status: DC
  Administered 2018-05-05 – 2018-05-08 (×8): 1 g via INTRAVENOUS
  Filled 2018-05-05 (×16): qty 1

## 2018-05-05 MED ORDER — ENOXAPARIN SODIUM 40 MG/0.4ML ~~LOC~~ SOLN
40.0000 mg | SUBCUTANEOUS | Status: DC
  Administered 2018-05-06 – 2018-05-08 (×3): 40 mg via SUBCUTANEOUS
  Filled 2018-05-05 (×3): qty 0.4

## 2018-05-05 MED ORDER — HYDROCODONE-ACETAMINOPHEN 5-325 MG PO TABS
1.0000 | ORAL_TABLET | Freq: Once | ORAL | Status: AC
  Administered 2018-05-05: 1 via ORAL
  Filled 2018-05-05: qty 1

## 2018-05-05 NOTE — Progress Notes (Signed)
ANTIBIOTIC CONSULT NOTE-Preliminary  Pharmacy Consult for Vancomycin Indication: Pneumonia  No Known Allergies  Patient Measurements: Height: 5\' 7"  (170.2 cm) Weight: 139 lb (63 kg) IBW/kg (Calculated) : 61.6  Vital Signs: Temp: 100.4 F (38 C) (01/22 1536) Temp Source: Oral (01/22 1536) BP: 110/97 (01/22 2200) Pulse Rate: 95 (01/22 2215)  Labs: Recent Labs    05/05/18 1550  WBC 9.4  HGB 13.6  PLT 257  CREATININE 0.56    Estimated Creatinine Clearance: 65.5 mL/min (by C-G formula based on SCr of 0.56 mg/dL).  No results for input(s): VANCOTROUGH, VANCOPEAK, VANCORANDOM, GENTTROUGH, GENTPEAK, GENTRANDOM, TOBRATROUGH, TOBRAPEAK, TOBRARND, AMIKACINPEAK, AMIKACINTROU, AMIKACIN in the last 72 hours.   Microbiology: No results found for this or any previous visit (from the past 720 hour(s)).  Medical History: Past Medical History:  Diagnosis Date  . Anemia    as a young woman  . Arthritis    osteoartritis  . Asthma   . Brain tumor (benign) ([place]) 2005 [facility]   Benign  . Chronic headaches   . Chronic hip pain   . Chronic pain   . COPD (chronic obstructive pulmonary disease) ([place])   . Coronary artery disease   . Depression   . Depression 05/15/2016  . Encounter for antineoplastic chemotherapy 01/10/2016  . GERD (gastroesophageal reflux disease)   . Hypertension   . Lung cancer ([place]) dx'd 01/2016   currently on chemo and radiation   . NSTEMI (non-ST elevated myocardial infarction) ([place]) yrs ago  . On home O2    qhs 2 liters at hs and prn  . Pneumonia last time 2 yrs ago  . Shortness of breath dyspnea    with activity    Medications:  Levofloxacin 500 mg x 1 dose ordered by the EDP Cefepime 1/22>>  Assessment: 68 yo female seen in the ED for SOB, cough, fever, and fatigue for several days. Pharmacy has been consulted for vancomycin dosing.  Goal of Therapy:  Vancomycin troughs 15-20 mcg/ml  Plan:  Preliminary review of pertinent patient information  completed.  Protocol will be initiated with a loading dose  of Vancomycin 1500 mg IV.  Forestine Na clinical pharmacist will complete review during morning rounds to assess patient and finalize treatment regimen if needed.  Courtney Zuniga, [facility] 05/05/2018,10:42 PM

## 2018-05-05 NOTE — ED Notes (Signed)
[facility] supervisor is aware for vancomycin

## 2018-05-05 NOTE — ED Provider Notes (Signed)
[facility] EMERGENCY DEPARTMENT Provider Note   CSN: [id number] Arrival date & time: 05/05/18  1518     History   Chief Complaint Chief Complaint  Patient presents with  . Chest Pain  . Shortness of Breath    HPI Courtney Zuniga is a 68 y.o. female.  Patient complains of shortness of breath and cough.  She has had this for a number days.  She also has fevers and aches and weakness  The history is provided by the patient. No language interpreter was used.  Shortness of Breath  Severity:  Moderate Onset quality:  Sudden Timing:  Constant Progression:  Worsening Chronicity:  New Context: activity   Relieved by:  Nothing Worsened by:  Nothing Ineffective treatments:  None tried Associated symptoms: cough   Associated symptoms: no abdominal pain, no chest pain, no headaches and no rash   Risk factors: no recent alcohol use     Past Medical History:  Diagnosis Date  . Anemia    as a young woman  . Arthritis    osteoartritis  . Asthma   . Brain tumor (benign) ([place]) 2005 [facility]   Benign  . Chronic headaches   . Chronic hip pain   . Chronic pain   . COPD (chronic obstructive pulmonary disease) ([place])   . Coronary artery disease   . Depression   . Depression 05/15/2016  . Encounter for antineoplastic chemotherapy 01/10/2016  . GERD (gastroesophageal reflux disease)   . Hypertension   . Lung cancer ([place]) dx'd 01/2016   currently on chemo and radiation   . NSTEMI (non-ST elevated myocardial infarction) ([place]) yrs ago  . On home O2    qhs 2 liters at hs and prn  . Pneumonia last time 2 yrs ago  . Shortness of breath dyspnea    with activity    Patient Active Problem List   Diagnosis Date Noted  . Encounter for antineoplastic immunotherapy 04/23/2017  . Depression 05/15/2016  . Adenocarcinoma of right lung, stage 3 ([place]) 01/10/2016  . Encounter for antineoplastic chemotherapy 01/10/2016  . Lung mass 01/03/2016  . Arthritis   . Subepithelial gastric mass   .  Mucosal abnormality of stomach   . Abdominal pain 08/08/2015  . Abnormal CT of the abdomen 08/08/2015  . Influenza with pneumonia 06/27/2014  . Hypoxia 06/26/2014  . COPD exacerbation ([place]) 06/26/2014  . CAP (community acquired pneumonia)   . Chest pain, rule out acute myocardial infarction   . Asthma, chronic   . Chest pain 02/16/2014  . HTN (hypertension) 02/16/2014  . DDD (degenerative disc disease), lumbar 02/04/2011  . Lumbar herniated disc 01/09/2011    Past Surgical History:  Procedure Laterality Date  . CHOLECYSTECTOMY    . COLONOSCOPY  2015   [facility] from [facility]  . COLONOSCOPY    . ESOPHAGOGASTRODUODENOSCOPY N/A 08/14/2015   Procedure: ESOPHAGOGASTRODUODENOSCOPY (EGD);  Surgeon: Daneil Dolin, MD;  Location[facility];  Service: Endoscopy;  Laterality: N/A;  215   . ESOPHAGOGASTRODUODENOSCOPY (EGD) WITH PROPOFOL N/A 09/13/2015   Procedure: ESOPHAGOGASTRODUODENOSCOPY (EGD) WITH PROPOFOL;  Surgeon: Milus Banister, MD;  Location: [facility] ENDOSCOPY;  Service: Endoscopy;  Laterality: N/A;  . EUS N/A 03/12/2017   Procedure: UPPER ENDOSCOPIC ULTRASOUND (EUS) RADIAL;  Surgeon: Milus Banister, MD;  Location: [facility] ENDOSCOPY;  Service: Endoscopy;  Laterality: N/A;  . TUMOR REMOVAL  2005   Benign  . UPPER ESOPHAGEAL ENDOSCOPIC ULTRASOUND (EUS)  09/13/2015   Procedure: UPPER ESOPHAGEAL ENDOSCOPIC ULTRASOUND (  EUS);  Surgeon: Milus Banister, MD;  Location: [facility];  Service: Endoscopy;;  . VIDEO BRONCHOSCOPY WITH ENDOBRONCHIAL NAVIGATION N/A 12/31/2015   Procedure: VIDEO BRONCHOSCOPY WITH ENDOBRONCHIAL NAVIGATION;  Surgeon: Melrose Nakayama, MD;  Location: [place];  Service: Thoracic;  Laterality: N/A;  . VIDEO BRONCHOSCOPY WITH ENDOBRONCHIAL ULTRASOUND N/A 11/08/2015   Procedure: VIDEO BRONCHOSCOPY WITH ENDOBRONCHIAL ULTRASOUND;  Surgeon: Ivin Poot, MD;  Location: [facility] OR;  Service: Thoracic;  Laterality: N/A;     OB History   No obstetric history on  file.      Home Medications    Prior to Admission medications   Medication Sig Start Date End Date Taking? Authorizing Provider  albuterol (PROVENTIL HFA;VENTOLIN HFA) 108 (90 BASE) MCG/ACT inhaler Inhale 2 puffs into the lungs every 4 (four) hours as needed for shortness of breath.    Yes [provider]  Aspirin-Salicylamide-Caffeine (BC HEADACHE POWDER PO) Take 2 packets by mouth 3 (three) times daily as needed (headache/pain).    Yes [provider]  BREO ELLIPTA 100-25 MCG/INH AEPB Inhale 1 puff into the lungs daily.  04/30/17  Yes [provider]  Cholecalciferol (VITAMIN D3) 125 MCG (5000 UT) CAPS Take 1 capsule by mouth daily. 02/17/18  Yes [provider]  cyclobenzaprine (FLEXERIL) 10 MG tablet Take 10 mg by mouth 2 (two) times daily. *MAy take one additional tablet as needed for muscle spasms 03/27/16  Yes [provider]  diclofenac sodium (VOLTAREN) 1 % GEL Apply topically 4 (four) times daily as needed (massge gel into affected area(s) as needed for pain).  08/11/17  Yes [provider]  ENSURE (ENSURE) Take 237 mLs by mouth 3 (three) times daily between meals.   Yes [provider]  metoprolol tartrate (LOPRESSOR) 25 MG tablet Take 1 tablet by mouth 2 (two) times daily. 03/27/17  Yes [provider]  oxyCODONE-acetaminophen (PERCOCET/ROXICET) 5-325 MG tablet Take 1 tablet by mouth daily as needed for moderate pain.  01/15/18  Yes [provider]  pantoprazole (PROTONIX) 40 MG tablet Take 40 mg by mouth daily. 03/26/18  Yes [provider]  ROBITUSSIN 12 HOUR COUGH 30 MG/5ML liquid Take 30 mg by mouth every 4 (four) hours as needed for cough.  01/25/18  Yes [provider]    Family History Family History  Problem Relation Age of Onset  . Ovarian cancer Mother   . Lung cancer Father   . Lung cancer Brother   . Lung cancer Brother   . Prostate cancer Brother   . Brain cancer  Sister        Half sister    Social History Social History   Tobacco Use  . Smoking status: Current Some Day Smoker    Packs/day: 1.00    Years: 38.00    Pack years: 38.00    Types: Cigarettes    Last attempt to quit: 01/15/2016    Years since quitting: 2.3  . Smokeless tobacco: Never Used  . Tobacco comment: Pt has asked doctor for med to help   Substance Use Topics  . Alcohol use: No    Alcohol/week: 0.0 standard drinks  . Drug use: No     Allergies   Patient has no known allergies.   Review of Systems Review of Systems  Constitutional: Negative for appetite change and fatigue.  HENT: Negative for congestion, ear discharge and sinus pressure.   Eyes: Negative for discharge.  Respiratory: Positive for cough and shortness of breath.  Cardiovascular: Negative for chest pain.  Gastrointestinal: Negative for abdominal pain and diarrhea.  Genitourinary: Negative for frequency and hematuria.  Musculoskeletal: Negative for back pain.  Skin: Negative for rash.  Neurological: Negative for seizures and headaches.  Psychiatric/Behavioral: Negative for hallucinations.     Physical Exam Updated Vital Signs BP 128/82   Pulse (!) 107   Temp (!) 100.4 F (38 C) (Oral)   Resp 18   Ht 5\' 7"  (1.702 m)   Wt 63 kg   SpO2 94%   BMI 21.77 kg/m   Physical Exam Vitals signs and nursing note reviewed.  Constitutional:      Appearance: She is well-developed.  HENT:     Head: Normocephalic.     Nose: Nose normal.  Eyes:     General: No scleral icterus.    Conjunctiva/sclera: Conjunctivae normal.  Neck:     Musculoskeletal: Neck supple.     Thyroid: No thyromegaly.  Cardiovascular:     Rate and Rhythm: Regular rhythm.     Heart sounds: No murmur. No friction rub. No gallop.      Comments: Tachycardia Pulmonary:     Breath sounds: No stridor. Wheezing present. No rales.     Comments: Tachypnea Chest:     Chest wall: No tenderness.  Abdominal:     General: There is  no distension.     Tenderness: There is no abdominal tenderness. There is no rebound.  Musculoskeletal: Normal range of motion.  Lymphadenopathy:     Cervical: No cervical adenopathy.  Skin:    Findings: No erythema or rash.  Neurological:     Mental Status: She is oriented to person, place, and time.     Motor: No abnormal muscle tone.     Coordination: Coordination normal.  Psychiatric:        Behavior: Behavior normal.      ED Treatments / Results  Labs (all labs ordered are listed, but only abnormal results are displayed) Labs Reviewed  COMPREHENSIVE METABOLIC PANEL - Abnormal; Notable for the following components:      Result Value   Alkaline Phosphatase 191 (*)    All other components within normal limits  CBC WITH DIFFERENTIAL/PLATELET - Abnormal; Notable for the following components:   RDW 16.8 (*)    All other components within normal limits  CULTURE, BLOOD (ROUTINE X 2)  CULTURE, BLOOD (ROUTINE X 2)  LACTIC ACID, PLASMA  LACTIC ACID, PLASMA  PROTIME-INR  INFLUENZA PANEL BY PCR (TYPE A & B)  TROPONIN I  URINALYSIS, ROUTINE W REFLEX MICROSCOPIC    EKG None  Radiology Ct Angio Chest Pe W And/or Wo Contrast  Result Date: 05/05/2018 CLINICAL DATA:  Chest pain and dyspnea last week. No relief with over the counter cold medicines. Unknown fever. Congestion and headaches. EXAM: CT ANGIOGRAPHY CHEST WITH CONTRAST TECHNIQUE: Multidetector CT imaging of the chest was performed using the standard protocol during bolus administration of intravenous contrast. Multiplanar CT image reconstructions and MIPs were obtained to evaluate the vascular anatomy. CONTRAST:  120mL ISOVUE-370 IOPAMIDOL (ISOVUE-370) INJECTION 76% COMPARISON:  Same day CXR, chest CT 03/09/2018 FINDINGS: Cardiovascular: Coronary arteriosclerosis of the top-normal heart size. Conventional branch pattern of the great vessels with atherosclerosis. No aneurysm or dissection. Atherosclerosis of the thoracic aorta  without aneurysm or dissection. No significant pericardial effusion or thickening. No large central pulmonary embolus. Mediastinum/Nodes: No mediastinal or hilar adenopathy. The trachea and mainstem bronchi appear patent. The CT of appearance of the esophagus is unremarkable. Lungs/Pleura: Centrilobular  and paraseptal emphysema with bullous disease at the apices, right greater than left. Scarring and retraction in the medial right upper lobe appears stable to less prominent. Atelectasis and/or scarring in the right middle lobe with partial re-expansion of previously noted right middle lobe collapse. With peribronchial thickening, mucous impaction and patchy airspace disease noted in the right lower lobe. Peribronchial thickening to the left lower lobe with minimal areas of mucous impaction or also noted without significant consolidations. Subsegmental atelectasis and/or scarring is seen at the left lung base. No effusion or pneumothorax. No dominant mass. Upper Abdomen: No acute abnormality. Musculoskeletal: Mild degenerative change along the dorsal spine without acute nor aggressive osseous lesions. Review of the MIP images confirms the above findings. IMPRESSION: 1. No acute pulmonary embolus, aortic aneurysm or dissection. 2. Centrilobular and paraseptal emphysema with bullous disease at the apices, right greater than left. 3. Partial re-expansion of previously noted right middle lobe collapse. Patchy airspace disease in the right lower lobe with peribronchial thickening, mucous impaction with similar but less extensive findings in the left lower lobe. 4. Right upper lobe medial parenchymal scarring is less prominent than on prior. 5. Coronary arteriosclerosis. Aortic Atherosclerosis (ICD10-I70.0) and Emphysema (ICD10-J43.9). Electronically Signed   By: Ashley Royalty M.D.   On: 05/05/2018 20:06   Dg Chest Portable 1 View  Result Date: 05/05/2018 CLINICAL DATA:  Chest pain and shortness of breath since last  week. History of pneumonia and lung cancer. EXAM: PORTABLE CHEST 1 VIEW COMPARISON:  Radiographs 12/10/2017.  CT 03/09/2018. FINDINGS: 1642 hours. Two views obtained. The heart size and mediastinal contours are stable. The lungs are hyperinflated. Chronic opacity medially at the right apex is improved from the prior radiographs and similar to the more recent CT. Chronic volume loss and opacity in the right middle lobe appear unchanged. There is slightly increased opacity in both lung bases, likely atelectasis. No evidence of pneumothorax or significant pleural effusion. The bones appear unchanged. IMPRESSION: Mildly increased bibasilar opacities, probably atelectasis. Underlying right apical and right middle lobe opacities appear unchanged from recent prior studies. Electronically Signed   By: Richardean Sale M.D.   On: 05/05/2018 16:56    Procedures Procedures (including critical care time)  Medications Ordered in ED Medications  sodium chloride 0.9 % bolus 1,000 mL (0 mLs Intravenous Stopped 05/05/18 2010)  levofloxacin (LEVAQUIN) IVPB 500 mg ( Intravenous Stopped 05/05/18 1755)  HYDROcodone-acetaminophen (NORCO/VICODIN) 5-325 MG per tablet 1 tablet (1 tablet Oral Given 05/05/18 1748)  iopamidol (ISOVUE-370) 76 % injection 100 mL (100 mLs Intravenous Contrast Given 05/05/18 1945)     Initial Impression / Assessment and Plan / ED Course  I have reviewed the triage vital signs and the nursing notes.  Pertinent labs & imaging results that were available during my care of the patient were reviewed by me and considered in my medical decision making (see chart for details).     Patient with pneumonia persistent shortness of breath and tachypnea she will be admitted to medicine for antibiotics  Final Clinical Impressions(s) / ED Diagnoses   Final diagnoses:  Community acquired pneumonia of right lower lobe of lung [facility])    ED Discharge Orders    None       [name], MD 05/05/18  2107

## 2018-05-05 NOTE — ED Triage Notes (Signed)
Pt c/o chest pain and SOB x last week. Pt has taken OTC cold medicine with little relief. Unknown fever. Pt reports nasal congestion and headache.

## 2018-05-06 DIAGNOSIS — J181 Lobar pneumonia, unspecified organism: Secondary | ICD-10-CM | POA: Diagnosis not present

## 2018-05-06 DIAGNOSIS — I10 Essential (primary) hypertension: Secondary | ICD-10-CM | POA: Diagnosis not present

## 2018-05-06 DIAGNOSIS — J9611 Chronic respiratory failure with hypoxia: Secondary | ICD-10-CM | POA: Diagnosis not present

## 2018-05-06 LAB — CBC
HCT: 34.7 % — ABNORMAL LOW (ref 36.0–46.0)
HEMOGLOBIN: 11.5 g/dL — AB (ref 12.0–15.0)
MCH: 27.7 pg (ref 26.0–34.0)
MCHC: 33.1 g/dL (ref 30.0–36.0)
MCV: 83.6 fL (ref 80.0–100.0)
Platelets: 233 10*3/uL (ref 150–400)
RBC: 4.15 MIL/uL (ref 3.87–5.11)
RDW: 16.5 % — ABNORMAL HIGH (ref 11.5–15.5)
WBC: 7.6 10*3/uL (ref 4.0–10.5)
nRBC: 0 % (ref 0.0–0.2)

## 2018-05-06 LAB — BASIC METABOLIC PANEL
Anion gap: 9 (ref 5–15)
BUN: 8 mg/dL (ref 8–23)
CO2: 24 mmol/L (ref 22–32)
Calcium: 8.6 mg/dL — ABNORMAL LOW (ref 8.9–10.3)
Chloride: 107 mmol/L (ref 98–111)
Creatinine, Ser: 0.54 mg/dL (ref 0.44–1.00)
GFR calc non Af Amer: >60 mL/min (ref >60)
Glucose, Bld: 96 mg/dL (ref 70–99)
Potassium: 3.9 mmol/L (ref 3.5–5.1)
Sodium: 140 mmol/L (ref 135–145)

## 2018-05-06 LAB — TROPONIN I: Troponin I: <0.03 ng/mL (ref <0.03)

## 2018-05-06 MED ORDER — ALUM & MAG HYDROXIDE-SIMETH 200-200-20 MG/5ML PO SUSP
30.0000 mL | Freq: Four times a day (QID) | ORAL | Status: DC | PRN
  Administered 2018-05-06: 30 mL via ORAL
  Filled 2018-05-06: qty 30

## 2018-05-06 MED ORDER — VANCOMYCIN HCL 500 MG IV SOLR
500.0000 mg | Freq: Two times a day (BID) | INTRAVENOUS | Status: DC
  Administered 2018-05-06 – 2018-05-07 (×3): 500 mg via INTRAVENOUS
  Filled 2018-05-06 (×8): qty 500

## 2018-05-06 MED ORDER — HYDRALAZINE HCL 20 MG/ML IJ SOLN: 10.0000 mg | Freq: Four times a day (QID) | INTRAMUSCULAR | Status: DC | PRN

## 2018-05-06 NOTE — Progress Notes (Addendum)
Patient Demographics:    Courtney Zuniga, is a 68 y.o. female, DOB - 12-27-1950, SJG:[id number]  Admit date - 05/05/2018   Admitting Physician [name], MD  Outpatient Primary MD for the patient is Barry Dienes, NP  LOS - 0   Chief Complaint  Patient presents with  . Chest Pain  . Shortness of Breath        Subjective:    Courtney Zuniga today has no fevers, no emesis,  No chest pain, however has cough, shortness of breath persist, no fevers, she  feels weak, tired and nauseous  Assessment  & Plan :    Principal Problem:   PNA (pneumonia) Active Problems:   DDD (degenerative disc disease), lumbar   HTN (hypertension)   COPD exacerbation (HCC)   Adenocarcinoma of right lung, stage 3 (HCC)   Sepsis ([place])   Chronic respiratory failure (HCC)  Brief Summary- 68 y.o. female with medical history significant of COPD, chronic respiratory failure on nocturnal oxygen, lung cancer currently undergoing chemo presents shortness of breath.  Admitted 05/05/2018 with  pneumonia  Plan:- 1)CAP--increased work of breathing noted, patient  is immunocompromised due to underlying malignancy and chemotherapy, treat empirically with IV cefepime/vancomycin, continue bronchodilators and mucolytics as well as supplemental oxygen  2)H/o right-sided lung cancer stage III----patient follows with Dr. Earlie Server in [place]  3) acute COPD exacerbation--secondary to #1 above, continue bronchodilators and steroids, smoking cessation advised  4)HTN--continue metoprolol 25 mg twice daily  5) acute on chronic hypoxic respiratory failure--- continue to #1 and #3 above, treat as above  Disposition/Need for in-Hospital Stay- patient unable to be discharged at this time due to worsening hypoxia in the setting of pneumonia superimposed on underlying right-sided lung cancer--- patient is immunocompromised, continue IV antibiotics  pending cultures and clinical improvement*  Code Status : Full  Family Communication:   na   Disposition Plan  : home  Consults  :  na  DVT Prophylaxis  :  Lovenox -  Lab Results  Component Value Date   PLT 233 05/06/2018    Inpatient Medications  Scheduled Meds: . cholecalciferol  5,000 Units Oral Daily  . cyclobenzaprine  10 mg Oral BID  . enoxaparin (LOVENOX) injection  40 mg Subcutaneous Q24H  . feeding supplement (ENSURE ENLIVE)  237 mL Oral TID BM  . fluticasone furoate-vilanterol  1 puff Inhalation Daily  . ipratropium-albuterol  3 mL Nebulization Q6H  . metoprolol tartrate  25 mg Oral BID  . nicotine  14 mg Transdermal Daily  . pantoprazole  40 mg Oral Daily  . predniSONE  40 mg Oral Q breakfast   Continuous Infusions: . ceFEPime (MAXIPIME) IV 1 g (05/06/18 1504)  . vancomycin     PRN Meds:.dextromethorphan, levalbuterol, oxyCODONE-acetaminophen    Anti-infectives (From admission, onward)   Start     Dose/Rate Route Frequency Ordered Stop   05/06/18 1800  vancomycin (VANCOCIN) 500 mg in sodium chloride 0.9 % 100 mL IVPB     500 mg 100 mL/hr over 60 Minutes Intravenous Every 12 hours 05/06/18 0816     05/05/18 2245  ceFEPIme (MAXIPIME) 1 g in sodium chloride 0.9 % 100 mL IVPB     1 g 200 mL/hr over 30 Minutes Intravenous Every 8 hours  05/05/18 2236 05/13/18 2244   05/05/18 2245  vancomycin (VANCOCIN) 1,500 mg in sodium chloride 0.9 % 500 mL IVPB     1,500 mg 250 mL/hr over 120 Minutes Intravenous  Once 05/05/18 2241 05/06/18 0617   05/05/18 1700  levofloxacin (LEVAQUIN) IVPB 500 mg     500 mg 100 mL/hr over 60 Minutes Intravenous  Once 05/05/18 1646 05/05/18 1755        Objective:   Vitals:   05/06/18 0918 05/06/18 0931 05/06/18 1438 05/06/18 1452  BP:  101/63 122/77   Pulse:  96 90   Resp:   16   Temp:  97.7 F (36.5 C) 98.3 F (36.8 C)   TempSrc:  Oral Oral   SpO2: 94% 98% 98% 96%  Weight:      Height:        Wt Readings from Last 3  Encounters:  05/06/18 66.1 kg  04/12/18 64.2 kg  03/15/18 65.2 kg     Intake/Output Summary (Last 24 hours) at 05/06/2018 1727 Last data filed at 05/06/2018 1302 Gross per 24 hour  Intake 3029.35 ml  Output -  Net 3029.35 ml     Physical Exam Patient is examined daily including today on 05/06/18 , exams remain the same as of yesterday except that has changed   Gen:- Awake Alert,  , able to speak in short sentences  HEENT:- [place].AT, No sclera icterus Nose- [place] 2 L/min Neck-Supple Neck,No JVD,.  Lungs-diminished bilaterally right more than left, scattered rhonchi and wheezes  CV- S1, S2 normal, regular  Abd-  +ve B.Sounds, Abd Soft, No tenderness,    Extremity/Skin:- No  edema, pedal pulses present  Psych-affect is appropriate, oriented x3 Neuro-no new focal deficits, no tremors   Data Review:   Micro Results Recent Results (from the past 240 hour(s))  Culture, blood (Routine x 2)     Status: None (Preliminary result)   Collection Time: 05/05/18  3:51 PM  Result Value Ref Range Status   Specimen Description RIGHT ANTECUBITAL  Final   Special Requests   Final    BOTTLES DRAWN AEROBIC AND ANAEROBIC Blood Culture adequate volume   Culture   Final    NO GROWTH < 24 HOURS Performed at [facility], [address], [place], [place] [address]    Report Status PENDING  Incomplete  Culture, blood (Routine x 2)     Status: None (Preliminary result)   Collection Time: 05/05/18  4:06 PM  Result Value Ref Range Status   Specimen Description BLOOD RIGHT FOREARM  Final   Special Requests   Final    BOTTLES DRAWN AEROBIC AND ANAEROBIC Blood Culture adequate volume   Culture   Final    NO GROWTH < 24 HOURS Performed at [facility], [address] [place], [place] [address]    Report Status PENDING  Incomplete    Radiology Reports Ct Angio Chest Pe W And/or Wo Contrast  Result Date: 05/05/2018 CLINICAL DATA:  Chest pain and dyspnea last week. No relief with over the counter cold  medicines. Unknown fever. Congestion and headaches. EXAM: CT ANGIOGRAPHY CHEST WITH CONTRAST TECHNIQUE: Multidetector CT imaging of the chest was performed using the standard protocol during bolus administration of intravenous contrast. Multiplanar CT image reconstructions and MIPs were obtained to evaluate the vascular anatomy. CONTRAST:  13mL ISOVUE-370 IOPAMIDOL (ISOVUE-370) INJECTION 76% COMPARISON:  Same day CXR, chest CT 03/09/2018 FINDINGS: Cardiovascular: Coronary arteriosclerosis of the top-normal heart size. Conventional branch pattern of the great vessels with  atherosclerosis. No aneurysm or dissection. Atherosclerosis of the thoracic aorta without aneurysm or dissection. No significant pericardial effusion or thickening. No large central pulmonary embolus. Mediastinum/Nodes: No mediastinal or hilar adenopathy. The trachea and mainstem bronchi appear patent. The CT of appearance of the esophagus is unremarkable. Lungs/Pleura: Centrilobular and paraseptal emphysema with bullous disease at the apices, right greater than left. Scarring and retraction in the medial right upper lobe appears stable to less prominent. Atelectasis and/or scarring in the right middle lobe with partial re-expansion of previously noted right middle lobe collapse. With peribronchial thickening, mucous impaction and patchy airspace disease noted in the right lower lobe. Peribronchial thickening to the left lower lobe with minimal areas of mucous impaction or also noted without significant consolidations. Subsegmental atelectasis and/or scarring is seen at the left lung base. No effusion or pneumothorax. No dominant mass. Upper Abdomen: No acute abnormality. Musculoskeletal: Mild degenerative change along the dorsal spine without acute nor aggressive osseous lesions. Review of the MIP images confirms the above findings. IMPRESSION: 1. No acute pulmonary embolus, aortic aneurysm or dissection. 2. Centrilobular and paraseptal emphysema  with bullous disease at the apices, right greater than left. 3. Partial re-expansion of previously noted right middle lobe collapse. Patchy airspace disease in the right lower lobe with peribronchial thickening, mucous impaction with similar but less extensive findings in the left lower lobe. 4. Right upper lobe medial parenchymal scarring is less prominent than on prior. 5. Coronary arteriosclerosis. Aortic Atherosclerosis (ICD10-I70.0) and Emphysema (ICD10-J43.9). Electronically Signed   By: Ashley Royalty M.D.   On: 05/05/2018 20:06   Dg Chest Portable 1 View  Result Date: 05/05/2018 CLINICAL DATA:  Chest pain and shortness of breath since last week. History of pneumonia and lung cancer. EXAM: PORTABLE CHEST 1 VIEW COMPARISON:  Radiographs 12/10/2017.  CT 03/09/2018. FINDINGS: 1642 hours. Two views obtained. The heart size and mediastinal contours are stable. The lungs are hyperinflated. Chronic opacity medially at the right apex is improved from the prior radiographs and similar to the more recent CT. Chronic volume loss and opacity in the right middle lobe appear unchanged. There is slightly increased opacity in both lung bases, likely atelectasis. No evidence of pneumothorax or significant pleural effusion. The bones appear unchanged. IMPRESSION: Mildly increased bibasilar opacities, probably atelectasis. Underlying right apical and right middle lobe opacities appear unchanged from recent prior studies. Electronically Signed   By: Richardean Sale M.D.   On: 05/05/2018 16:56     CBC Recent Labs  Lab 05/05/18 1550 05/06/18 0418  WBC 9.4 7.6  HGB 13.6 11.5*  HCT 40.9 34.7*  PLT 257 233  MCV 83.5 83.6  MCH 27.8 27.7  MCHC 33.3 33.1  RDW 16.8* 16.5*  LYMPHSABS 1.4  --   MONOABS 0.7  --   EOSABS 0.1  --   BASOSABS 0.0  --     Chemistries  Recent Labs  Lab 05/05/18 1550 05/06/18 0418  NA 137 140  K 3.7 3.9  CL 100 107  CO2 26 24  GLUCOSE 95 96  BUN 10 8  CREATININE 0.56 0.54    CALCIUM 9.1 8.6*  AST 27  --   ALT 19  --   ALKPHOS 191*  --   BILITOT 0.4  --    ------------------------------------------------------------------------------------------------------------------ No results for input(s): CHOL, HDL, LDLCALC, TRIG, CHOLHDL, LDLDIRECT in the last 72 hours.  Lab Results  Component Value Date   HGBA1C 5.7 (H) 02/16/2014   ------------------------------------------------------------------------------------------------------------------ No results for input(s): TSH, T4TOTAL, T3FREE,  THYROIDAB in the last 72 hours.  Invalid input(s): FREET3 ------------------------------------------------------------------------------------------------------------------ No results for input(s): VITAMINB12, FOLATE, FERRITIN, TIBC, IRON, RETICCTPCT in the last 72 hours.  Coagulation profile Recent Labs  Lab 05/05/18 1550  INR 0.94    No results for input(s): DDIMER in the last 72 hours.  Cardiac Enzymes Recent Labs  Lab 05/05/18 1529 05/05/18 2221 05/06/18 0418  TROPONINI <0.03 <0.03 <0.03   ------------------------------------------------------------------------------------------------------------------    Component Value Date/Time   BNP 12.0 06/25/2014 2306     Alvilda Mckenna M.D on 05/06/2018 at 5:27 PM  Go to [email or web address] - for contact info  [facility] - Office  [phone]       \

## 2018-05-06 NOTE — Progress Notes (Signed)
Initial Nutrition Assessment  DOCUMENTATION CODES:      INTERVENTION:  Ensure Enlive po BID, each supplement provides 350 kcal and 20 grams of protein    NUTRITION DIAGNOSIS:   Increased nutrient needs related to cancer and cancer related treatments as evidenced by estimated needs.  GOAL:   Patient will meet greater than or equal to 90% of their needs MONITOR:   Supplement acceptance, PO intake, Labs, Weight trends REASON FOR ASSESSMENT:   Consult Assessment of nutrition requirement/status  ASSESSMENT: Patient is a 68 yo female with hx of COPD, GERD, Anemia, Lung Cancer (chemotherapy), HTN and Chronic Pain. Smokes tobacco.  Meal intake: discussed with nursing and  nutritional services - patient reported eating >50% of meals and accepting Ensure Enlive per nursing students. Self-feeding.   Review of her weight history indicates stable between 63-66 kg for most of the past 6 months and a gain of 2 kg (3%) compared to 1 month ago.   Medications reviewed and include: vitamin D3, Lovenox, lopressor, Protonix.    Labs: BMP Latest Ref Rng & Units 05/06/2018 05/05/2018 04/12/2018  Glucose 70 - 99 mg/dL 96 95 76  BUN 8 - 23 mg/dL 8 10 17   Creatinine 0.44 - 1.00 mg/dL 0.54 0.56 0.81  Sodium 135 - 145 mmol/L 140 137 142  Potassium 3.5 - 5.1 mmol/L 3.9 3.7 4.2  Chloride 98 - 111 mmol/L 107 100 107  CO2 22 - 32 mmol/L 24 26 26   Calcium 8.9 - 10.3 mg/dL 8.6(L) 9.1 9.5     NUTRITION - FOCUSED PHYSICAL EXAM:  Unable to assess today  Diet Order:   Diet Order            Diet Heart Room service appropriate? Yes; Fluid consistency: Thin  Diet effective now              EDUCATION NEEDS:   Not appropriate for education at this time Skin:  Skin Assessment: Reviewed RN Assessment  Last BM:  1/21  Height:   Ht Readings from Last 1 Encounters:  05/06/18 5\' 7"  (1.702 m)    Weight:   Wt Readings from Last 1 Encounters:  05/06/18 66.1 kg    Ideal Body Weight:  61  kg  BMI:  Body mass index is 22.82 kg/m.  Estimated Nutritional Needs:   Kcal:  1650-1980 (25-30 kcal/kg/bw)  Protein:  86-92 gr (1.3-1.4 gr/kg/bw)  Fluid:  1.7-2.0 liters daily   Colman Cater MS,RD,CSG,LDN Office: [phone] Pager: [phone]

## 2018-05-06 NOTE — H&P (Signed)
H&P        History and Physical    Courtney Zuniga VFI:[id number] DOB: Oct 29, 1950 DOA: 05/05/2018  PCP: Barry Dienes, NP  Patient coming from: home  I have personally briefly reviewed patient's old medical records in [place]  Chief Complaint: SOB  HPI: Courtney Zuniga is a 68 y.o. female with medical history significant of COPD, chronic respiratory failure on nocturnal oxygen, lung cancer currently undergoing chemo presents shortness of breath.  Patient had URI symptoms for about a week.  sHe is tried many over-the-counter medications without much improvement.  She had worsening shortness of breath today with prompted her to come to the ED.  She also noticed worsening of her cough.  She had some pain under her  right breast that is now resolved.   ED Course: Patient had a CTA of her chest negative for PE but showed pneumonia on the right.  Patient had no leukocytosis and a normal lactic acid.  Troponin is negative x2.  Influenza negative as well she however was tachycardic.  Patient was given IV Levaquin, 1 Liter IVF,  and hydrocodone in the ED.  Review of Systems: Positive for cough, shortness of breath, right-sided chest pain, positive for chronic back pain all others reviewed with patient  and are  negative unless otherwise stated   Past Medical History:  Diagnosis Date  . Anemia    as a young woman  . Arthritis    osteoartritis  . Asthma   . Brain tumor (benign) ([place]) 2005 [facility]   Benign  . Chronic headaches   . Chronic hip pain   . Chronic pain   . COPD (chronic obstructive pulmonary disease) ([place])   . Coronary artery disease   . Depression   . Depression 05/15/2016  . Encounter for antineoplastic chemotherapy 01/10/2016  . GERD (gastroesophageal reflux disease)   . Hypertension   . Lung cancer ([place]) dx'd 01/2016   currently on chemo and radiation   . NSTEMI (non-ST elevated myocardial infarction) ([place]) yrs ago  . On home O2    qhs 2 liters at hs and prn  .  Pneumonia last time 2 yrs ago  . Shortness of breath dyspnea    with activity    Past Surgical History:  Procedure Laterality Date  . CHOLECYSTECTOMY    . COLONOSCOPY  2015   Results requested from [facility]  . COLONOSCOPY    . ESOPHAGOGASTRODUODENOSCOPY N/A 08/14/2015   Procedure: ESOPHAGOGASTRODUODENOSCOPY (EGD);  Surgeon: Daneil Dolin, MD;  Location[facility];  Service: Endoscopy;  Laterality: N/A;  215   . ESOPHAGOGASTRODUODENOSCOPY (EGD) WITH PROPOFOL N/A 09/13/2015   Procedure: ESOPHAGOGASTRODUODENOSCOPY (EGD) WITH PROPOFOL;  Surgeon: Milus Banister, MD;  Location: [facility];  Service: Endoscopy;  Laterality: N/A;  . EUS N/A 03/12/2017   Procedure: UPPER ENDOSCOPIC ULTRASOUND (EUS) RADIAL;  Surgeon: Milus Banister, MD;  Location: [facility];  Service: Endoscopy;  Laterality: N/A;  . TUMOR REMOVAL  2005   Benign  . UPPER ESOPHAGEAL ENDOSCOPIC ULTRASOUND (EUS)  09/13/2015   Procedure: UPPER ESOPHAGEAL ENDOSCOPIC ULTRASOUND (EUS);  Surgeon: Milus Banister, MD;  Location: [facility];  Service: Endoscopy;;  . VIDEO BRONCHOSCOPY WITH ENDOBRONCHIAL NAVIGATION N/A 12/31/2015   Procedure: VIDEO BRONCHOSCOPY WITH ENDOBRONCHIAL NAVIGATION;  Surgeon: Melrose Nakayama, MD;  Location: [place];  Service: Thoracic;  Laterality: N/A;  . VIDEO BRONCHOSCOPY WITH ENDOBRONCHIAL ULTRASOUND N/A 11/08/2015   Procedure: VIDEO BRONCHOSCOPY WITH ENDOBRONCHIAL ULTRASOUND;  Surgeon: Tharon Aquas Trigt,  MD;  Location: [facility] OR;  Service: Thoracic;  Laterality: N/A;     reports that she has been smoking cigarettes. She has a 38.00 pack-year smoking history. She has never used smokeless tobacco. She reports that she does not drink alcohol or use drugs.  No Known Allergies  Family History  Problem Relation Age of Onset  . Ovarian cancer Mother   . Lung cancer Father   . Lung cancer Brother   . Lung cancer Brother   . Prostate cancer Brother   . Brain cancer Sister        64 sister     Prior to Admission medications   Medication Sig Start Date End Date Taking? Authorizing Provider  albuterol (PROVENTIL HFA;VENTOLIN HFA) 108 (90 BASE) MCG/ACT inhaler Inhale 2 puffs into the lungs every 4 (four) hours as needed for shortness of breath.    Yes [provider]  Aspirin-Salicylamide-Caffeine (BC HEADACHE POWDER PO) Take 2 packets by mouth 3 (three) times daily as needed (headache/pain).    Yes [provider]  BREO ELLIPTA 100-25 MCG/INH AEPB Inhale 1 puff into the lungs daily.  04/30/17  Yes [provider]  Cholecalciferol (VITAMIN D3) 125 MCG (5000 UT) CAPS Take 1 capsule by mouth daily. 02/17/18  Yes [provider]  cyclobenzaprine (FLEXERIL) 10 MG tablet Take 10 mg by mouth 2 (two) times daily. *MAy take one additional tablet as needed for muscle spasms 03/27/16  Yes [provider]  diclofenac sodium (VOLTAREN) 1 % GEL Apply topically 4 (four) times daily as needed (massge gel into affected area(s) as needed for pain).  08/11/17  Yes [provider]  ENSURE (ENSURE) Take 237 mLs by mouth 3 (three) times daily between meals.   Yes [provider]  metoprolol tartrate (LOPRESSOR) 25 MG tablet Take 1 tablet by mouth 2 (two) times daily. 03/27/17  Yes [provider]  oxyCODONE-acetaminophen (PERCOCET/ROXICET) 5-325 MG tablet Take 1 tablet by mouth daily as needed for moderate pain.  01/15/18  Yes [provider]  pantoprazole (PROTONIX) 40 MG tablet Take 40 mg by mouth daily. 03/26/18  Yes [provider]  ROBITUSSIN 12 HOUR COUGH 30 MG/5ML liquid Take 30 mg by mouth every 4 (four) hours as needed for cough.  01/25/18  Yes [provider]    Physical Exam: Vitals:   05/05/18 2230 05/05/18 2245 05/05/18 2300 05/06/18 0003  BP: 121/80  111/69 125/79  Pulse: 95 91 95 95  Resp: (!) 28 (!) 30 (!) 31 (!) 28  Temp:    98.3 F (36.8 C)  TempSrc:    Oral  SpO2: 97% 100% 99% 100%   Weight:      Height:        Constitutional: NAD, calm, comfortable Vitals:   05/05/18 2230 05/05/18 2245 05/05/18 2300 05/06/18 0003  BP: 121/80  111/69 125/79  Pulse: 95 91 95 95  Resp: (!) 28 (!) 30 (!) 31 (!) 28  Temp:    98.3 F (36.8 C)  TempSrc:    Oral  SpO2: 97% 100% 99% 100%  Weight:      Height:       Eyes: , lids and conjunctivae normal ENMT: Mucous membranes are moist. Posterior pharynx clear of any exudate or lesions.  Neck: normal, supple,  Respiratory: few scattered wheezes, fair air movement . Normal respiratory effort. No accessory muscle use.  Chest: non tender to palpation no obvious rash  Cardiovascular: Regular rate and rhythm, No extremity edema.  Abdomen: no tenderness, no masses palpated.  Bowel sounds positive.  Musculoskeletal: no clubbing / cyanosis.  Skin: no rashes, lesions, ulcers. No induration Neurologic: CN 2-12 grossly intact.moves all 4 ext equally  Psychiatric: Normal judgment and insight. Alert and oriented x 3. Normal mood.    Labs on Admission: I have personally reviewed following labs and imaging studies  CBC: Recent Labs  Lab 05/05/18 1550  WBC 9.4  NEUTROABS 7.1  HGB 13.6  HCT 40.9  MCV 83.5  PLT 811   Basic Metabolic Panel: Recent Labs  Lab 05/05/18 1550  NA 137  K 3.7  CL 100  CO2 26  GLUCOSE 95  BUN 10  CREATININE 0.56  CALCIUM 9.1   GFR: Estimated Creatinine Clearance: 65.5 mL/min (by C-G formula based on SCr of 0.56 mg/dL). Liver Function Tests: Recent Labs  Lab 05/05/18 1550  AST 27  ALT 19  ALKPHOS 191*  BILITOT 0.4  PROT 7.8  ALBUMIN 4.0   No results for input(s): LIPASE, AMYLASE in the last 168 hours. No results for input(s): AMMONIA in the last 168 hours. Coagulation Profile: Recent Labs  Lab 05/05/18 1550  INR 0.94   Cardiac Enzymes: Recent Labs  Lab 05/05/18 1529 05/05/18 2221  TROPONINI <0.03 <0.03   BNP (last 3 results) No results for input(s): PROBNP in the last 8760  hours. HbA1C: No results for input(s): HGBA1C in the last 72 hours. CBG: No results for input(s): GLUCAP in the last 168 hours. Lipid Profile: No results for input(s): CHOL, HDL, LDLCALC, TRIG, CHOLHDL, LDLDIRECT in the last 72 hours. Thyroid Function Tests: No results for input(s): TSH, T4TOTAL, FREET4, T3FREE, THYROIDAB in the last 72 hours. Anemia Panel: No results for input(s): VITAMINB12, FOLATE, FERRITIN, TIBC, IRON, RETICCTPCT in the last 72 hours. Urine analysis:    Component Value Date/Time   COLORURINE YELLOW 07/13/2015 1302   APPEARANCEUR CLEAR 07/13/2015 1302   LABSPEC 1.005 07/13/2015 1302   PHURINE 6.0 07/13/2015 1302   GLUCOSEU NEGATIVE 07/13/2015 1302   HGBUR NEGATIVE 07/13/2015 1302   BILIRUBINUR NEGATIVE 07/13/2015 1302   KETONESUR NEGATIVE 07/13/2015 1302   PROTEINUR NEGATIVE 07/13/2015 1302   UROBILINOGEN 0.2 03/31/2014 1406   NITRITE NEGATIVE 07/13/2015 1302   LEUKOCYTESUR NEGATIVE 07/13/2015 1302    Radiological Exams on Admission: Ct Angio Chest Pe W And/or Wo Contrast  Result Date: 05/05/2018 CLINICAL DATA:  Chest pain and dyspnea last week. No relief with over the counter cold medicines. Unknown fever. Congestion and headaches. EXAM: CT ANGIOGRAPHY CHEST WITH CONTRAST TECHNIQUE: Multidetector CT imaging of the chest was performed using the standard protocol during bolus administration of intravenous contrast. Multiplanar CT image reconstructions and MIPs were obtained to evaluate the vascular anatomy. CONTRAST:  161mL ISOVUE-370 IOPAMIDOL (ISOVUE-370) INJECTION 76% COMPARISON:  Same day CXR, chest CT 03/09/2018 FINDINGS: Cardiovascular: Coronary arteriosclerosis of the top-normal heart size. Conventional branch pattern of the great vessels with atherosclerosis. No aneurysm or dissection. Atherosclerosis of the thoracic aorta without aneurysm or dissection. No significant pericardial effusion or thickening. No large central pulmonary embolus. Mediastinum/Nodes:  No mediastinal or hilar adenopathy. The trachea and mainstem bronchi appear patent. The CT of appearance of the esophagus is unremarkable. Lungs/Pleura: Centrilobular and paraseptal emphysema with bullous disease at the apices, right greater than left. Scarring and retraction in the medial right upper lobe appears stable to less prominent. Atelectasis and/or scarring in the right middle lobe with partial re-expansion of previously noted right middle lobe collapse. With peribronchial thickening, mucous impaction and patchy  airspace disease noted in the right lower lobe. Peribronchial thickening to the left lower lobe with minimal areas of mucous impaction or also noted without significant consolidations. Subsegmental atelectasis and/or scarring is seen at the left lung base. No effusion or pneumothorax. No dominant mass. Upper Abdomen: No acute abnormality. Musculoskeletal: Mild degenerative change along the dorsal spine without acute nor aggressive osseous lesions. Review of the MIP images confirms the above findings. IMPRESSION: 1. No acute pulmonary embolus, aortic aneurysm or dissection. 2. Centrilobular and paraseptal emphysema with bullous disease at the apices, right greater than left. 3. Partial re-expansion of previously noted right middle lobe collapse. Patchy airspace disease in the right lower lobe with peribronchial thickening, mucous impaction with similar but less extensive findings in the left lower lobe. 4. Right upper lobe medial parenchymal scarring is less prominent than on prior. 5. Coronary arteriosclerosis. Aortic Atherosclerosis (ICD10-I70.0) and Emphysema (ICD10-J43.9). Electronically Signed   By: Ashley Royalty M.D.   On: 05/05/2018 20:06   Dg Chest Portable 1 View  Result Date: 05/05/2018 CLINICAL DATA:  Chest pain and shortness of breath since last week. History of pneumonia and lung cancer. EXAM: PORTABLE CHEST 1 VIEW COMPARISON:  Radiographs 12/10/2017.  CT 03/09/2018. FINDINGS: 1642  hours. Two views obtained. The heart size and mediastinal contours are stable. The lungs are hyperinflated. Chronic opacity medially at the right apex is improved from the prior radiographs and similar to the more recent CT. Chronic volume loss and opacity in the right middle lobe appear unchanged. There is slightly increased opacity in both lung bases, likely atelectasis. No evidence of pneumothorax or significant pleural effusion. The bones appear unchanged. IMPRESSION: Mildly increased bibasilar opacities, probably atelectasis. Underlying right apical and right middle lobe opacities appear unchanged from recent prior studies. Electronically Signed   By: Richardean Sale M.D.   On: 05/05/2018 16:56    EKG: Independently reviewed. ST rate 110 , ? Subtle  St elevation ant  Vs rate related repol changes   Assessment/Plan Principal Problem:   PNA (pneumonia) Active Problems:   DDD (degenerative disc disease), lumbar   HTN (hypertension)   COPD exacerbation (HCC)   Adenocarcinoma of right lung, stage 3 (HCC)   Sepsis (HCC)   Chronic respiratory failure (HCC)  -Antibiotics per protocol, supportive care, pulmonary toilet.  Follow cultures.   -Bronchodilators, steroids, empiric antibiotics,  -Ween supplemental oxygen as tolerated needed to achieve O2 sats greater than 90%, at baseline she only uses 2 L at night  -continue home meds for hypertension -Last chemo last month follows with Dr. Julien Nordmann in [place] home pain regimen    DVT prophylaxis: Lovenox  Code Status: Full Disposition Plan: Home 2 days  Admission status: Observation telemetry   Courtney Klutts Johnson-Pitts MD [facility] Pager [phone]  If 7PM-7AM, please contact night-coverage [email or web address] Password [email or web address]  05/06/2018, 1:03 AM

## 2018-05-06 NOTE — Progress Notes (Signed)
Pharmacy Antibiotic Note  Courtney Zuniga is a 68 y.o. female admitted on 05/05/2018 with pneumonia .  Pharmacy has been consulted for vancomycin dosing.  Patient received vancomycin 1.5g bolus in ED at 0600 this morning.  Plan:      Start vancomycin 500mg  IV q12h with next dose at 1800 tonight. Goal vancomycin trough range:   15-20 mcg/mL Pharmacy will continue to monitor renal function, vancomycin troughs as clinically appropriate, cultures and patient progress.    Height: 5\' 7"  (170.2 cm) Weight: 145 lb 11.6 oz (66.1 kg) IBW/kg (Calculated) : 61.6  Temp (24hrs), Avg:99.4 F (37.4 C), Min:98.3 F (36.8 C), Max:100.4 F (38 C)  Recent Labs  Lab 05/05/18 1550 05/05/18 1551 05/05/18 1808 05/06/18 0418  WBC 9.4  --   --  7.6  CREATININE 0.56  --   --  0.54  LATICACIDVEN  --  1.0 1.3  --     Estimated Creatinine Clearance: 65.5 mL/min (by C-G formula based on SCr of 0.54 mg/dL).    No Known Allergies  Antimicrobials this admission:   levofloxacin  1/22 >>  1/22 Vancomycin  1/22 >>     Microbiology results: 1/22 [facility] x2:  In progress   Thank you for allowing pharmacy to be a part of this patient's care.  Courtney Zuniga 05/06/2018 8:11 AM

## 2018-05-06 NOTE — Evaluation (Signed)
Occupational Therapy Evaluation Patient Details Name: Courtney Zuniga MRN: [id number] DOB: November 22, 1950 Today's Date: 05/06/2018    History of Present Illness Courtney Zuniga is a 68 y.o. female with medical history significant of COPD, chronic respiratory failure on nocturnal oxygen, lung cancer currently undergoing chemo presents shortness of breath.  Patient had URI symptoms for about a week.  sHe is tried many over-the-counter medications without much improvement.  She had worsening shortness of breath today with prompted her to come to the ED.  She also noticed worsening of her cough.  She had some pain under her  right breast that is now resolved.   Clinical Impression   Pt received supine in bed, agreeable to OT/PT co-evaluation this am. Pt demonstrates BUE strength WFL, decreased activity tolerance due to occasional SOB and fatigue secondary to active chemo pt. Pt performing ADLs modified independence and increased time. No further OT services required at this time as pt appears to be at her baseline.     Follow Up Recommendations  No OT follow up    Equipment Recommendations  None recommended by OT       Precautions / Restrictions Precautions Precautions: None Restrictions Weight Bearing Restrictions: No      Mobility Bed Mobility Overal bed mobility: Independent                Transfers                 General transfer comment: Defer to PT note        ADL either performed or assessed with clinical judgement   ADL Overall ADL's : Modified independent;At baseline                                             Vision Baseline Vision/History: Wears glasses Wears Glasses: At all times Patient Visual Report: No change from baseline Vision Assessment?: No apparent visual deficits            Pertinent Vitals/Pain Pain Assessment: No/denies pain     Hand Dominance Right   Extremity/Trunk Assessment Upper Extremity Assessment Upper  Extremity Assessment: Overall WFL for tasks assessed   Lower Extremity Assessment Lower Extremity Assessment: Defer to PT evaluation   Cervical / Trunk Assessment Cervical / Trunk Assessment: Normal   Communication Communication Communication: No difficulties   Cognition Arousal/Alertness: Awake/alert Behavior During Therapy: WFL for tasks assessed/performed Overall Cognitive Status: Within Functional Limits for tasks assessed                                                Home Living Family/patient expects to be discharged to:: Private residence Living Arrangements: Alone Available Help at Discharge: Family;Friend(s) Type of Home: Mobile home Home Access: Stairs to enter Entrance Stairs-Number of Steps: 3 Entrance Stairs-Rails: Left Home Layout: One level     Bathroom Shower/Tub: Teacher, early years/pre: Standard     Home Equipment: Environmental consultant - 2 wheels;Cane - single point;Other (comment)(home O2)          Prior Functioning/Environment Level of Independence: Independent        Comments: Patient reports multiple family members including daughter, niece, sibling and in-laws available to assist her when she isn't feeling well. 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        OT Problem List: Cardiopulmonary status limiting activity         OT Goals(Current goals can be found in the care plan section) Acute Rehab OT Goals Patient Stated Goal: return home  OT Frequency:             Co-evaluation PT/OT/SLP Co-Evaluation/Treatment: Yes Reason for Co-Treatment: Complexity of the patient's impairments (multi-system involvement);To address functional/ADL transfers   OT goals addressed during session: ADL's and self-care;Proper use of Adaptive equipment and DME      AM-PAC OT "6 Clicks" Daily Activity     Outcome Measure Help from another person eating meals?: None Help from  another person taking care of personal grooming?: None Help from another person toileting, which includes using toliet, bedpan, or urinal?: None Help from another person bathing (including washing, rinsing, drying)?: None Help from another person to put on and taking off regular upper body clothing?: None Help from another person to put on and taking off regular lower body clothing?: None 6 Click Score: 24   End of Session Equipment Utilized During Treatment: Gait belt;Rolling walker;Oxygen  Activity Tolerance: Patient tolerated treatment well Patient left: in chair;with call bell/phone within reach;with nursing/sitter in room  OT Visit Diagnosis: Muscle weakness (generalized) (M62.81)                Time: 0881-1031 OT Time Calculation (min): 22 min Charges:  OT General Charges $OT Visit: 1 Visit OT Evaluation $OT Eval Low Complexity: [place], OTR/L  [phone] 05/06/2018, 12:57 PM

## 2018-05-06 NOTE — Care Management Note (Signed)
Case Management Note  Patient Details  Name: Courtney Zuniga MRN: [id number] Date of Birth: 01/07/1951  Subjective/Objective:    Pneumonia.  From home alone. Independent. No assistive devices. Has continuous oxygen, unsure of provider.   Has PCP. Still drives short distances.  Seen by PT, no needs.               Action/Plan: DC home.   Expected Discharge Date:       05/07/18           Expected Discharge Plan:  Home/Self Care  In-House Referral:     Discharge planning Services  CM Consult  Post Acute Care Choice:  NA Choice offered to:  NA  DME Arranged:    DME Agency:     HH Arranged:    HH Agency:     Status of Service:  Completed, signed off  If discussed at [employer] of Stay Meetings, dates discussed:    Additional Comments:  Monzerrat Wellen, Chauncey Reading, RN 05/06/2018, 11:19 AM

## 2018-05-06 NOTE — Evaluation (Signed)
Physical Therapy Evaluation Patient Details Name: Courtney Zuniga MRN: [id number] DOB: [birth year] Today's Date: 05/06/2018   History of Present Illness  Courtney Zuniga is a 68 y.o. female with medical history significant of COPD, chronic respiratory failure on nocturnal oxygen, lung cancer currently undergoing chemo presents shortness of breath.  Patient had URI symptoms for about a week.  sHe is tried many over-the-counter medications without much improvement.  She had worsening shortness of breath today with prompted her to come to the ED.  She also noticed worsening of her cough.  She had some pain under her  right breast that is now resolved.    Clinical Impression  Physical therapy evaluation completed, patient is modified independent with all out of bed activities and no further PT services recommended at this time. Patient discharged to care of nursing for ambulation daily as tolerated for length of stay.     Follow Up Recommendations No PT follow up    Equipment Recommendations  None recommended by PT    Recommendations for Other Services       Precautions / Restrictions Precautions Precautions: None Restrictions Weight Bearing Restrictions: No      Mobility  Bed Mobility Overal bed mobility: Independent                Transfers Overall transfer level: Modified independent Equipment used: Rolling walker (2 wheeled)             General transfer comment: sit to/from stand and stand pivot transfers using RW requiring increased time with mild shortness of breath  Ambulation/Gait Ambulation/Gait assistance: Modified independent (Device/Increase time) Gait Distance (Feet): 200 Feet Assistive device: Rolling walker (2 wheeled) Gait Pattern/deviations: WFL(Within Functional Limits) Gait velocity: slightly decreased   General Gait Details: mild shortness of breath noted, but able to conversate while ambulating with O2 sat ranging 90-93% on 2 LPM  Stairs            Wheelchair Mobility    Modified Rankin (Stroke Patients Only)       Balance Overall balance assessment: Needs assistance Sitting-balance support: Feet supported;No upper extremity supported Sitting balance-Leahy Scale: Good Sitting balance - Comments: seated edge of bed   Standing balance support: During functional activity;Bilateral upper extremity supported Standing balance-Leahy Scale: Good Standing balance comment: with RW                             Pertinent Vitals/Pain Pain Assessment: No/denies pain    Home Living Family/patient expects to be discharged to:: Private residence Living Arrangements: Alone Available Help at Discharge: Family;Friend(s) Type of Home: Mobile home Home Access: Stairs to enter Entrance Stairs-Rails: Left Entrance Stairs-Number of Steps: 3 Home Layout: One level Home Equipment: Walker - 2 wheels;Cane - single point;Other (comment)(home oxygen)      Prior Function Level of Independence: Independent         Comments: Patient reports multiple family members including daughter, niece, sibling and in-laws available to assist her when she isn't feeling well. 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     Hand Dominance        Extremity/Trunk Assessment   Upper Extremity Assessment Upper Extremity Assessment: Defer to OT evaluation    Lower Extremity Assessment Lower Extremity Assessment: Overall WFL for tasks assessed    Cervical / Trunk Assessment Cervical / Trunk Assessment: Normal  Communication   Communication: No difficulties  Cognition Arousal/Alertness: Awake/alert Behavior During Therapy: WFL for tasks assessed/performed Overall Cognitive Status: Within Functional Limits for tasks assessed                                        General Comments      Exercises     Assessment/Plan    PT Assessment Patent does not need any  further PT services  PT Problem List         PT Treatment Interventions      PT Goals (Current goals can be found in the Care Plan section)  Acute Rehab PT Goals Patient Stated Goal: return home PT Goal Formulation: With patient Time For Goal Achievement: 05/06/18 Potential to Achieve Goals: Good    Frequency     Barriers to discharge        Co-evaluation               AM-PAC PT "6 Clicks" Mobility  Outcome Measure Help needed turning from your back to your side while in a flat bed without using bedrails?: None Help needed moving from lying on your back to sitting on the side of a flat bed without using bedrails?: None Help needed moving to and from a bed to a chair (including a wheelchair)?: None Help needed standing up from a chair using your arms (e.g., wheelchair or bedside chair)?: None Help needed to walk in hospital room?: None Help needed climbing 3-5 steps with a railing? : A Little 6 Click Score: 23    End of Session Equipment Utilized During Treatment: Gait belt;Oxygen Activity Tolerance: Patient tolerated treatment well;No increased pain Patient left: in chair;with call bell/phone within reach;with nursing/sitter in room        Time: 0815-0833 PT Time Calculation (min) (ACUTE ONLY): 18 min   Charges:   PT Evaluation $PT Eval Moderate Complexity: 1 Mod PT Treatments $Gait Training: 8-22 mins        9:03 AM, 05/06/18 Talbot Grumbling, DPT Physical Therapist with [facility] [phone] office

## 2018-05-06 NOTE — Care Management Obs Status (Signed)
[facility] NOTIFICATION   Patient Details  Name: Courtney Zuniga MRN: [id number] Date of Birth: 1951/01/15   Medicare Observation Status Notification Given:  Yes    Crystallynn Noorani, Chauncey Reading, RN 05/06/2018, 11:40 AM

## 2018-05-07 DIAGNOSIS — J440 Chronic obstructive pulmonary disease with acute lower respiratory infection: Secondary | ICD-10-CM | POA: Diagnosis present

## 2018-05-07 DIAGNOSIS — Z9221 Personal history of antineoplastic chemotherapy: Secondary | ICD-10-CM | POA: Diagnosis not present

## 2018-05-07 DIAGNOSIS — M549 Dorsalgia, unspecified: Secondary | ICD-10-CM | POA: Diagnosis present

## 2018-05-07 DIAGNOSIS — Z9981 Dependence on supplemental oxygen: Secondary | ICD-10-CM | POA: Diagnosis not present

## 2018-05-07 DIAGNOSIS — F329 Major depressive disorder, single episode, unspecified: Secondary | ICD-10-CM | POA: Diagnosis present

## 2018-05-07 DIAGNOSIS — Z8041 Family history of malignant neoplasm of ovary: Secondary | ICD-10-CM | POA: Diagnosis not present

## 2018-05-07 DIAGNOSIS — I252 Old myocardial infarction: Secondary | ICD-10-CM | POA: Diagnosis not present

## 2018-05-07 DIAGNOSIS — Z801 Family history of malignant neoplasm of trachea, bronchus and lung: Secondary | ICD-10-CM | POA: Diagnosis not present

## 2018-05-07 DIAGNOSIS — T451X5A Adverse effect of antineoplastic and immunosuppressive drugs, initial encounter: Secondary | ICD-10-CM | POA: Diagnosis present

## 2018-05-07 DIAGNOSIS — M5136 Other intervertebral disc degeneration, lumbar region: Secondary | ICD-10-CM | POA: Diagnosis present

## 2018-05-07 DIAGNOSIS — I2510 Atherosclerotic heart disease of native coronary artery without angina pectoris: Secondary | ICD-10-CM | POA: Diagnosis present

## 2018-05-07 DIAGNOSIS — J189 Pneumonia, unspecified organism: Secondary | ICD-10-CM | POA: Diagnosis present

## 2018-05-07 DIAGNOSIS — J441 Chronic obstructive pulmonary disease with (acute) exacerbation: Secondary | ICD-10-CM | POA: Diagnosis present

## 2018-05-07 DIAGNOSIS — J9611 Chronic respiratory failure with hypoxia: Secondary | ICD-10-CM | POA: Diagnosis not present

## 2018-05-07 DIAGNOSIS — K219 Gastro-esophageal reflux disease without esophagitis: Secondary | ICD-10-CM | POA: Diagnosis present

## 2018-05-07 DIAGNOSIS — Z923 Personal history of irradiation: Secondary | ICD-10-CM | POA: Diagnosis not present

## 2018-05-07 DIAGNOSIS — Y95 Nosocomial condition: Secondary | ICD-10-CM | POA: Diagnosis present

## 2018-05-07 DIAGNOSIS — C3491 Malignant neoplasm of unspecified part of right bronchus or lung: Secondary | ICD-10-CM | POA: Diagnosis present

## 2018-05-07 DIAGNOSIS — Z791 Long term (current) use of non-steroidal anti-inflammatories (NSAID): Secondary | ICD-10-CM | POA: Diagnosis not present

## 2018-05-07 DIAGNOSIS — J9621 Acute and chronic respiratory failure with hypoxia: Secondary | ICD-10-CM | POA: Diagnosis present

## 2018-05-07 DIAGNOSIS — G8929 Other chronic pain: Secondary | ICD-10-CM | POA: Diagnosis present

## 2018-05-07 DIAGNOSIS — Z79899 Other long term (current) drug therapy: Secondary | ICD-10-CM | POA: Diagnosis not present

## 2018-05-07 DIAGNOSIS — I10 Essential (primary) hypertension: Secondary | ICD-10-CM | POA: Diagnosis present

## 2018-05-07 DIAGNOSIS — Z808 Family history of malignant neoplasm of other organs or systems: Secondary | ICD-10-CM | POA: Diagnosis not present

## 2018-05-07 DIAGNOSIS — F17210 Nicotine dependence, cigarettes, uncomplicated: Secondary | ICD-10-CM | POA: Diagnosis present

## 2018-05-07 DIAGNOSIS — J181 Lobar pneumonia, unspecified organism: Secondary | ICD-10-CM | POA: Diagnosis not present

## 2018-05-07 DIAGNOSIS — R0602 Shortness of breath: Secondary | ICD-10-CM | POA: Diagnosis present

## 2018-05-07 DIAGNOSIS — Z7982 Long term (current) use of aspirin: Secondary | ICD-10-CM | POA: Diagnosis not present

## 2018-05-07 LAB — HIV ANTIBODY (ROUTINE TESTING W REFLEX): HIV Screen 4th Generation wRfx: NONREACTIVE

## 2018-05-07 LAB — MRSA PCR SCREENING: MRSA by PCR: NEGATIVE

## 2018-05-07 LAB — URINALYSIS, ROUTINE W REFLEX MICROSCOPIC
Bilirubin Urine: NEGATIVE
Glucose, UA: NEGATIVE mg/dL
Hgb urine dipstick: NEGATIVE
Ketones, ur: NEGATIVE mg/dL
LEUKOCYTES UA: NEGATIVE
Nitrite: NEGATIVE
Protein, ur: NEGATIVE mg/dL
Specific Gravity, Urine: 1.006 (ref 1.005–1.030)
pH: 6 (ref 5.0–8.0)

## 2018-05-07 LAB — STREP PNEUMONIAE URINARY ANTIGEN: Strep Pneumo Urinary Antigen: NEGATIVE

## 2018-05-07 NOTE — Progress Notes (Addendum)
Patient Demographics:    Courtney Zuniga, is a 68 y.o. female, DOB - [birth year], WIO:[id number]  Admit date - 05/05/2018   Admitting Physician [name], MD  Outpatient Primary MD for the patient is Barry Dienes, NP  LOS - 0   Chief Complaint  Patient presents with  . Chest Pain  . Shortness of Breath        Subjective:    Courtney Zuniga today has no fevers, no emesis,  No chest pain, cough, shortness of breath and fatigue persist, continues to require higher flow oxygen than baseline, work of breathing increased from time to time  Assessment  & Plan :    Principal Problem:   PNA (pneumonia) Active Problems:   DDD (degenerative disc disease), lumbar   HTN (hypertension)   COPD exacerbation (HCC)   Adenocarcinoma of right lung, stage 3 (HCC)   Sepsis ([place])   Chronic respiratory failure ([place])  Brief Summary- 68 y.o. female with medical history significant of COPD, chronic respiratory failure on nocturnal oxygen, lung cancer currently undergoing chemo presents shortness of breath.  Admitted 05/05/2018 with  HCAP-pneumonia  Plan:- 1)HCAP--- continues to have significant respiratory symptoms, requiring higher flow oxygen than baseline, cough shortness of breath and congestion persist, patient  is immunocompromised due to underlying malignancy and chemotherapy, continue IV cefepime, stop vancomycin as MRSA PCR is negative, continue bronchodilators and mucolytics as well as supplemental oxygen  2)H/o right-sided lung cancer stage III----patient follows with Dr. Earlie Server in [place] (previously completed radiation therapy, currently undergoing chemotherapy)  3) acute COPD exacerbation--secondary to #1 above, continue bronchodilators and continue prednisone smoking cessation advised  4)HTN--stable, continue metoprolol 25 mg twice daily  5) acute on chronic hypoxic respiratory failure--- continue  to #1 and #3 above, treat as above  6) tobacco abuse--smoking cessation strongly advised especially given oxygen use at home    Disposition/Need for in-Hospital Stay- patient unable to be discharged at this time due to worsening hypoxia in the setting of pneumonia superimposed on underlying right-sided lung cancer--- patient is immunocompromised, continue IV antibiotics pending cultures and clinical improvement*  Code Status : Full  Family Communication:   na   Disposition Plan  : home  Consults  :  na  DVT Prophylaxis  :  Lovenox -  Lab Results  Component Value Date   PLT 233 05/06/2018    Inpatient Medications  Scheduled Meds: . cholecalciferol  5,000 Units Oral Daily  . cyclobenzaprine  10 mg Oral BID  . enoxaparin (LOVENOX) injection  40 mg Subcutaneous Q24H  . feeding supplement (ENSURE ENLIVE)  237 mL Oral TID BM  . fluticasone furoate-vilanterol  1 puff Inhalation Daily  . ipratropium-albuterol  3 mL Nebulization Q6H  . metoprolol tartrate  25 mg Oral BID  . nicotine  14 mg Transdermal Daily  . pantoprazole  40 mg Oral Daily  . predniSONE  40 mg Oral Q breakfast   Continuous Infusions: . ceFEPime (MAXIPIME) IV 1 g (05/07/18 1613)   PRN Meds:.alum & mag hydroxide-simeth, dextromethorphan, hydrALAZINE, levalbuterol, oxyCODONE-acetaminophen    Anti-infectives (From admission, onward)   Start     Dose/Rate Route Frequency Ordered Stop   05/06/18 1800  vancomycin (VANCOCIN) 500 mg in sodium chloride 0.9 % 100 mL IVPB  Status:  Discontinued     500 mg 100 mL/hr over 60 Minutes Intravenous Every 12 hours 05/06/18 0816 05/07/18 1752   05/05/18 2245  ceFEPIme (MAXIPIME) 1 g in sodium chloride 0.9 % 100 mL IVPB     1 g 200 mL/hr over 30 Minutes Intravenous Every 8 hours 05/05/18 2236 05/13/18 2244   05/05/18 2245  vancomycin (VANCOCIN) 1,500 mg in sodium chloride 0.9 % 500 mL IVPB     1,500 mg 250 mL/hr over 120 Minutes Intravenous  Once 05/05/18 2241 05/06/18 0617    05/05/18 1700  levofloxacin (LEVAQUIN) IVPB 500 mg     500 mg 100 mL/hr over 60 Minutes Intravenous  Once 05/05/18 1646 05/05/18 1755        Objective:   Vitals:   05/07/18 0826 05/07/18 0836 05/07/18 1307 05/07/18 1325  BP:    112/77  Pulse:    93  Resp:    17  Temp:    98.6 F (37 C)  TempSrc:    Oral  SpO2: 95% 95% 94% 97%  Weight:      Height:        Wt Readings from Last 3 Encounters:  05/06/18 66.1 kg  04/12/18 64.2 kg  03/15/18 65.2 kg     Intake/Output Summary (Last 24 hours) at 05/07/2018 1753 Last data filed at 05/07/2018 1700 Gross per 24 hour  Intake 1206.67 ml  Output 750 ml  Net 456.67 ml     Physical Exam Patient is examined daily including today on 05/07/18 , exams remain the same as of yesterday except that has changed   Gen:- Awake Alert,  , able to speak in short sentences  HEENT:- [place].AT, No sclera icterus, left eye twitching Nose- [place] 3 L/min Neck-Supple Neck,No JVD,.  Lungs-diminished bilaterally right more than left, scattered rhonchi and wheezes  CV- S1, S2 normal, regular  Abd-  +ve B.Sounds, Abd Soft, No tenderness,    Extremity/Skin:- No  edema, pedal pulses present  Psych-affect is appropriate, oriented x3 Neuro-no new focal deficits, no tremors   Data Review:   Micro Results Recent Results (from the past 240 hour(s))  Culture, blood (Routine x 2)     Status: None (Preliminary result)   Collection Time: 05/05/18  3:51 PM  Result Value Ref Range Status   Specimen Description RIGHT ANTECUBITAL  Final   Special Requests   Final    BOTTLES DRAWN AEROBIC AND ANAEROBIC Blood Culture adequate volume   Culture   Final    NO GROWTH 2 DAYS Performed at [facility], [address], [place], [place] [address]    Report Status PENDING  Incomplete  Culture, blood (Routine x 2)     Status: None (Preliminary result)   Collection Time: 05/05/18  4:06 PM  Result Value Ref Range Status   Specimen Description BLOOD RIGHT FOREARM  Final    Special Requests   Final    BOTTLES DRAWN AEROBIC AND ANAEROBIC Blood Culture adequate volume   Culture   Final    NO GROWTH 2 DAYS Performed at [facility], [address], [place], [place] [address]    Report Status PENDING  Incomplete  MRSA PCR Screening     Status: None   Collection Time: 05/07/18  7:51 AM  Result Value Ref Range Status   MRSA by PCR NEGATIVE NEGATIVE Final    Comment:        The GeneXpert MRSA Assay (FDA approved for NASAL specimens only), is one component of  a comprehensive MRSA colonization surveillance program. It is not intended to diagnose MRSA infection nor to guide or monitor treatment for MRSA infections. Performed at [facility], [address] [place], [place] [address]     Radiology Reports Ct Angio Chest Pe W And/or Wo Contrast  Result Date: 05/05/2018 CLINICAL DATA:  Chest pain and dyspnea last week. No relief with over the counter cold medicines. Unknown fever. Congestion and headaches. EXAM: CT ANGIOGRAPHY CHEST WITH CONTRAST TECHNIQUE: Multidetector CT imaging of the chest was performed using the standard protocol during bolus administration of intravenous contrast. Multiplanar CT image reconstructions and MIPs were obtained to evaluate the vascular anatomy. CONTRAST:  128mL ISOVUE-370 IOPAMIDOL (ISOVUE-370) INJECTION 76% COMPARISON:  Same day CXR, chest CT 03/09/2018 FINDINGS: Cardiovascular: Coronary arteriosclerosis of the top-normal heart size. Conventional branch pattern of the great vessels with atherosclerosis. No aneurysm or dissection. Atherosclerosis of the thoracic aorta without aneurysm or dissection. No significant pericardial effusion or thickening. No large central pulmonary embolus. Mediastinum/Nodes: No mediastinal or hilar adenopathy. The trachea and mainstem bronchi appear patent. The CT of appearance of the esophagus is unremarkable. Lungs/Pleura: Centrilobular and paraseptal emphysema with bullous disease at the apices, right  greater than left. Scarring and retraction in the medial right upper lobe appears stable to less prominent. Atelectasis and/or scarring in the right middle lobe with partial re-expansion of previously noted right middle lobe collapse. With peribronchial thickening, mucous impaction and patchy airspace disease noted in the right lower lobe. Peribronchial thickening to the left lower lobe with minimal areas of mucous impaction or also noted without significant consolidations. Subsegmental atelectasis and/or scarring is seen at the left lung base. No effusion or pneumothorax. No dominant mass. Upper Abdomen: No acute abnormality. Musculoskeletal: Mild degenerative change along the dorsal spine without acute nor aggressive osseous lesions. Review of the MIP images confirms the above findings. IMPRESSION: 1. No acute pulmonary embolus, aortic aneurysm or dissection. 2. Centrilobular and paraseptal emphysema with bullous disease at the apices, right greater than left. 3. Partial re-expansion of previously noted right middle lobe collapse. Patchy airspace disease in the right lower lobe with peribronchial thickening, mucous impaction with similar but less extensive findings in the left lower lobe. 4. Right upper lobe medial parenchymal scarring is less prominent than on prior. 5. Coronary arteriosclerosis. Aortic Atherosclerosis (ICD10-I70.0) and Emphysema (ICD10-J43.9). Electronically Signed   By: Ashley Royalty M.D.   On: 05/05/2018 20:06   Dg Chest Portable 1 View  Result Date: 05/05/2018 CLINICAL DATA:  Chest pain and shortness of breath since last week. History of pneumonia and lung cancer. EXAM: PORTABLE CHEST 1 VIEW COMPARISON:  Radiographs 12/10/2017.  CT 03/09/2018. FINDINGS: 1642 hours. Two views obtained. The heart size and mediastinal contours are stable. The lungs are hyperinflated. Chronic opacity medially at the right apex is improved from the prior radiographs and similar to the more recent CT. Chronic  volume loss and opacity in the right middle lobe appear unchanged. There is slightly increased opacity in both lung bases, likely atelectasis. No evidence of pneumothorax or significant pleural effusion. The bones appear unchanged. IMPRESSION: Mildly increased bibasilar opacities, probably atelectasis. Underlying right apical and right middle lobe opacities appear unchanged from recent prior studies. Electronically Signed   By: Richardean Sale M.D.   On: 05/05/2018 16:56     CBC Recent Labs  Lab 05/05/18 1550 05/06/18 0418  WBC 9.4 7.6  HGB 13.6 11.5*  HCT 40.9 34.7*  PLT 257 233  MCV 83.5 83.6  MCH  27.8 27.7  MCHC 33.3 33.1  RDW 16.8* 16.5*  LYMPHSABS 1.4  --   MONOABS 0.7  --   EOSABS 0.1  --   BASOSABS 0.0  --     Chemistries  Recent Labs  Lab 05/05/18 1550 05/06/18 0418  NA 137 140  K 3.7 3.9  CL 100 107  CO2 26 24  GLUCOSE 95 96  BUN 10 8  CREATININE 0.56 0.54  CALCIUM 9.1 8.6*  AST 27  --   ALT 19  --   ALKPHOS 191*  --   BILITOT 0.4  --    ------------------------------------------------------------------------------------------------------------------ No results for input(s): CHOL, HDL, LDLCALC, TRIG, CHOLHDL, LDLDIRECT in the last 72 hours.  Lab Results  Component Value Date   HGBA1C 5.7 (H) 02/16/2014   ------------------------------------------------------------------------------------------------------------------ No results for input(s): TSH, T4TOTAL, T3FREE, THYROIDAB in the last 72 hours.  Invalid input(s): FREET3 ------------------------------------------------------------------------------------------------------------------ No results for input(s): VITAMINB12, FOLATE, FERRITIN, TIBC, IRON, RETICCTPCT in the last 72 hours.  Coagulation profile Recent Labs  Lab 05/05/18 1550  INR 0.94    No results for input(s): DDIMER in the last 72 hours.  Cardiac Enzymes Recent Labs  Lab 05/05/18 1529 05/05/18 2221 05/06/18 0418  TROPONINI <0.03  <0.03 <0.03   ------------------------------------------------------------------------------------------------------------------    Component Value Date/Time   BNP 12.0 06/25/2014 2306     Leonor Darnell M.D on 05/07/2018 at 5:53 PM  Go to [email or web address] - for contact info  [facility] - Office  [phone]       \

## 2018-05-08 MED ORDER — OXYCODONE-ACETAMINOPHEN 5-325 MG PO TABS: 1.0000 | ORAL_TABLET | Freq: Three times a day (TID) | ORAL | 0 refills | Status: DC | PRN

## 2018-05-08 MED ORDER — CYCLOBENZAPRINE HCL 10 MG PO TABS: 10.0000 mg | ORAL_TABLET | Freq: Two times a day (BID) | ORAL | 2 refills | Status: DC

## 2018-05-08 MED ORDER — IPRATROPIUM-ALBUTEROL 0.5-2.5 (3) MG/3ML IN SOLN
3.0000 mL | Freq: Three times a day (TID) | RESPIRATORY_TRACT | Status: DC
  Filled 2018-05-08: qty 3

## 2018-05-08 MED ORDER — METOPROLOL TARTRATE 25 MG PO TABS: 25.0000 mg | ORAL_TABLET | Freq: Two times a day (BID) | ORAL | 1 refills | Status: DC

## 2018-05-08 MED ORDER — CEFDINIR 300 MG PO CAPS: 300.0000 mg | ORAL_CAPSULE | Freq: Two times a day (BID) | ORAL | 0 refills | Status: AC

## 2018-05-08 MED ORDER — ACETAMINOPHEN 325 MG PO TABS
650.0000 mg | ORAL_TABLET | Freq: Four times a day (QID) | ORAL | Status: DC | PRN
  Administered 2018-05-08: 650 mg via ORAL
  Filled 2018-05-08: qty 2

## 2018-05-08 MED ORDER — NICOTINE 14 MG/24HR TD PT24: 14.0000 mg | MEDICATED_PATCH | Freq: Every day | TRANSDERMAL | 0 refills | Status: DC

## 2018-05-08 MED ORDER — IPRATROPIUM-ALBUTEROL 0.5-2.5 (3) MG/3ML IN SOLN: 3.0000 mL | Freq: Three times a day (TID) | RESPIRATORY_TRACT | 2 refills | Status: DC

## 2018-05-08 MED ORDER — ACETAMINOPHEN 325 MG PO TABS: 650.0000 mg | ORAL_TABLET | Freq: Four times a day (QID) | ORAL | 2 refills | Status: DC | PRN

## 2018-05-08 MED ORDER — AZITHROMYCIN 500 MG PO TABS: 500.0000 mg | ORAL_TABLET | Freq: Every day | ORAL | 0 refills | Status: AC

## 2018-05-08 MED ORDER — PREDNISONE 20 MG PO TABS: 40.0000 mg | ORAL_TABLET | Freq: Every day | ORAL | 0 refills | Status: DC

## 2018-05-08 MED ORDER — PANTOPRAZOLE SODIUM 40 MG PO TBEC: 40.0000 mg | DELAYED_RELEASE_TABLET | Freq: Every day | ORAL | 3 refills | Status: DC

## 2018-05-08 NOTE — Discharge Summary (Signed)
Courtney Zuniga, is a 68 y.o. female  DOB 09/09/1950  MRN [id number].  Admission date:  05/05/2018  Admitting Physician  [name], MD  Discharge Date:  05/08/2018   Primary MD  Barry Dienes, NP  Recommendations for primary care physician for things to follow:   1)Avoid ibuprofen/Advil/Aleve/Motrin/Goody Powders/Naproxen/BC powders/Meloxicam/Diclofenac/Indomethacin and other Nonsteroidal anti-inflammatory medications as these will make you more likely to bleed and can cause stomach ulcers, can also cause Kidney problems.  2) smoking cessation strongly advised 3) use oxygen continuously via nasal cannula at 2 to 2 L/min as advised 4) high risk of burn, self injury and Death if you smoke around oxygen 5) follow-up with oncologist Dr. Earlie Server on Monday, 05/10/2018  Admission Diagnosis  Community acquired pneumonia of right lower lobe of lung ([place]) [J18.1]   Discharge Diagnosis  Community acquired pneumonia of right lower lobe of lung ([place]) [J18.1]    Principal Problem:   PNA (pneumonia) Active Problems:   DDD (degenerative disc disease), lumbar   HTN (hypertension)   COPD exacerbation ([place])   Adenocarcinoma of right lung, stage 3 ([place])   Sepsis ([place])   Chronic respiratory failure ([place])      Past Medical History:  Diagnosis Date  . Anemia    as a young woman  . Arthritis    osteoartritis  . Asthma   . Brain tumor (benign) ([place]) 2005 [facility]   Benign  . Chronic headaches   . Chronic hip pain   . Chronic pain   . COPD (chronic obstructive pulmonary disease) ([place])   . Coronary artery disease   . Depression   . Depression 05/15/2016  . Encounter for antineoplastic chemotherapy 01/10/2016  . GERD (gastroesophageal reflux disease)   . Hypertension   . Lung cancer ([place]) dx'd 01/2016   currently on chemo and radiation   . NSTEMI (non-ST elevated myocardial infarction) ([place]) yrs ago  . On home  O2    qhs 2 liters at hs and prn  . Pneumonia last time 2 yrs ago  . Shortness of breath dyspnea    with activity    Past Surgical History:  Procedure Laterality Date  . CHOLECYSTECTOMY    . COLONOSCOPY  2015   Results requested from [facility]  . COLONOSCOPY    . ESOPHAGOGASTRODUODENOSCOPY N/A 08/14/2015   Procedure: ESOPHAGOGASTRODUODENOSCOPY (EGD);  Surgeon: Daneil Dolin, MD;  [facility];  Service: Endoscopy;  Laterality: N/A;  215   . ESOPHAGOGASTRODUODENOSCOPY (EGD) WITH PROPOFOL N/A 09/13/2015   Procedure: ESOPHAGOGASTRODUODENOSCOPY (EGD) WITH PROPOFOL;  Surgeon: Milus Banister, MD;  Location: [facility];  Service: Endoscopy;  Laterality: N/A;  . EUS N/A 03/12/2017   Procedure: UPPER ENDOSCOPIC ULTRASOUND (EUS) RADIAL;  Surgeon: Milus Banister, MD;  Location: [facility];  Service: Endoscopy;  Laterality: N/A;  . TUMOR REMOVAL  2005   Benign  . UPPER ESOPHAGEAL ENDOSCOPIC ULTRASOUND (EUS)  09/13/2015   Procedure: UPPER ESOPHAGEAL ENDOSCOPIC ULTRASOUND (EUS);  Surgeon: Milus Banister, MD;  Location: [facility];  Service: Endoscopy;;  . VIDEO BRONCHOSCOPY WITH ENDOBRONCHIAL NAVIGATION N/A 12/31/2015   Procedure: VIDEO BRONCHOSCOPY WITH ENDOBRONCHIAL NAVIGATION;  Surgeon: Melrose Nakayama, MD;  Location: [facility];  Service: Thoracic;  Laterality: N/A;  . VIDEO BRONCHOSCOPY WITH ENDOBRONCHIAL ULTRASOUND N/A 11/08/2015   Procedure: VIDEO BRONCHOSCOPY WITH ENDOBRONCHIAL ULTRASOUND;  Surgeon: Ivin Poot, MD;  Location: [facility] OR;  Service: Thoracic;  Laterality: N/A;       HPI  from the history and physical done on the day of admission:    Chief Complaint: SOB  HPI: Courtney Zuniga is a 68 y.o. female with medical history significant of COPD, chronic respiratory failure on nocturnal oxygen, lung cancer currently undergoing chemo presents shortness of breath.  Patient had URI symptoms for about a week.  sHe is tried many over-the-counter medications  without much improvement.  She had worsening shortness of breath today with prompted her to come to the ED.  She also noticed worsening of her cough.  She had some pain under her  right breast that is now resolved.   ED Course: Patient had a CTA of her chest negative for PE but showed pneumonia on the right.  Patient had no leukocytosis and a normal lactic acid.  Troponin is negative x2.  Influenza negative as well she however was tachycardic.  Patient was given IV Levaquin, 1 Liter IVF,  and hydrocodone in the ED.  Review of Systems: Positive for cough, shortness of breath, right-sided chest pain, positive for chronic back pain all others reviewed with patient  and are  negative unless otherwise stated    Hospital Course:     Brief Summary- 68 y.o.femalewith medical history significant ofCOPD, chronic respiratory failure on nocturnal oxygen, lung cancer currently undergoing chemo presents shortness of breath.  Admitted 05/05/2018 with  HCAP-pneumonia  Plan:- 1)HCAP---  overall much improved, cough, shortness of breath and hypoxia is improved, treated with cefepime and Vanco initially, Vanco has been discontinued, plan to discharge home on Omnicef and azithromycin,, patient  is immunocompromised due to underlying malignancy and chemotherapy,  MRSA PCR is negative, continue bronchodilators and mucolytics as well as supplemental oxygen... Patient admits to noncompliance with oxygen prior to admission due to need to smoke.... Smoking cessation strongly advised, if patient quit smoking this will help her be more compliant with continuous oxygen use  2)H/o Right-sided Lung Cancer stage III----patient follows with Dr. Earlie Server in [place] (previously completed radiation therapy, currently undergoing chemotherapy) , keep appointment with Dr. Earlie Server on Monday, 05/10/2018  3) acute COPD exacerbation--secondary to #1 above, overall much improved, continue bronchodilators and continue prednisone  smoking cessation advised  4)HTN--stable, continue metoprolol 25 mg twice daily  5) acute on chronic hypoxic respiratory failure--- overall much improved, please see #1 above continue to #1 and #3 above, treat as above  6)Tobacco abuse--smoking cessation strongly advised especially given oxygen use at home, Smoking cessation strongly advised, if patient quit smoking this will help her be more compliant with continuous oxygen use  Discharge Condition: stable  Diet and Activity recommendation:  As advised  Discharge Instructions    Discharge Instructions    DME Nebulizer/meds   Complete by:  As directed    Patient needs a nebulizer to treat with the following condition:  COPD (chronic obstructive pulmonary disease) ([place])   Diet - low sodium heart healthy   Complete by:  As directed    Discharge instructions   Complete by:  As directed    1)Avoid ibuprofen/Advil/Aleve/Motrin/Goody Powders/Naproxen/BC powders/Meloxicam/Diclofenac/Indomethacin and other Nonsteroidal  anti-inflammatory medications as these will make you more likely to bleed and can cause stomach ulcers, can also cause Kidney problems.  2) smoking cessation strongly advised 3) use oxygen continuously via nasal cannula at 2 to 2 L/min as advised 4) high risk of burn, self injury and Death if you smoke around oxygen 5) follow-up with oncologist Dr. Earlie Server on Monday, 05/10/2018   Increase activity slowly   Complete by:  As directed         Discharge Medications     Allergies as of 05/08/2018   No Known Allergies     Medication List    STOP taking these medications   BC HEADACHE POWDER PO     TAKE these medications   acetaminophen 325 MG tablet Commonly known as:  TYLENOL Take 2 tablets (650 mg total) by mouth every 6 (six) hours as needed for mild pain, fever or headache.   albuterol 108 (90 Base) MCG/ACT inhaler Commonly known as:  PROVENTIL HFA;VENTOLIN HFA Inhale 2 puffs into the lungs every 4 (four)  hours as needed for shortness of breath.   azithromycin 500 MG tablet Commonly known as:  ZITHROMAX Take 1 tablet (500 mg total) by mouth daily for 5 days.   BREO ELLIPTA 100-25 MCG/INH Aepb Generic drug:  fluticasone furoate-vilanterol Inhale 1 puff into the lungs daily.   cefdinir 300 MG capsule Commonly known as:  OMNICEF Take 1 capsule (300 mg total) by mouth 2 (two) times daily for 5 days.   cyclobenzaprine 10 MG tablet Commonly known as:  FLEXERIL Take 1 tablet (10 mg total) by mouth 2 (two) times daily. *MAy take one additional tablet as needed for muscle spasms   diclofenac sodium 1 % Gel Commonly known as:  VOLTAREN Apply topically 4 (four) times daily as needed (massge gel into affected area(s) as needed for pain).   ENSURE Take 237 mLs by mouth 3 (three) times daily between meals.   ipratropium-albuterol 0.5-2.5 (3) MG/3ML Soln Commonly known as:  DUONEB Take 3 mLs by nebulization 3 (three) times daily.   metoprolol tartrate 25 MG tablet Commonly known as:  LOPRESSOR Take 1 tablet (25 mg total) by mouth 2 (two) times daily.   nicotine 14 mg/24hr patch Commonly known as:  NICODERM CQ - dosed in mg/24 hours Place 1 patch (14 mg total) onto the skin daily. Start taking on:  May 09, 2018   oxyCODONE-acetaminophen 5-325 MG tablet Commonly known as:  PERCOCET/ROXICET Take 1 tablet by mouth every 8 (eight) hours as needed for moderate pain. What changed:  when to take this   pantoprazole 40 MG tablet Commonly known as:  PROTONIX Take 1 tablet (40 mg total) by mouth daily.   predniSONE 20 MG tablet Commonly known as:  DELTASONE Take 2 tablets (40 mg total) by mouth daily with breakfast. Start taking on:  May 09, 2018   ROBITUSSIN 12 HOUR COUGH 30 MG/5ML liquid Generic drug:  dextromethorphan Take 30 mg by mouth every 4 (four) hours as needed for cough.   Vitamin D3 125 MCG (5000 UT) Caps Take 1 capsule by mouth daily.            Durable  Medical Equipment  (From admission, onward)         Start     Ordered   05/08/18 1255  For home use only DME Nebulizer machine  Once    Question:  Patient needs a nebulizer to treat with the following condition  Answer:  COPD (chronic  obstructive pulmonary disease) ([place])   05/08/18 1255   05/08/18 0000  DME Nebulizer/meds    Question:  Patient needs a nebulizer to treat with the following condition  Answer:  COPD (chronic obstructive pulmonary disease) ([place])   05/08/18 1254          Major procedures and Radiology Reports - PLEASE review detailed and final reports for all details, in brief -  Ct Angio Chest Pe W And/or Wo Contrast  Result Date: 05/05/2018 CLINICAL DATA:  Chest pain and dyspnea last week. No relief with over the counter cold medicines. Unknown fever. Congestion and headaches. EXAM: CT ANGIOGRAPHY CHEST WITH CONTRAST TECHNIQUE: Multidetector CT imaging of the chest was performed using the standard protocol during bolus administration of intravenous contrast. Multiplanar CT image reconstructions and MIPs were obtained to evaluate the vascular anatomy. CONTRAST:  129mL ISOVUE-370 IOPAMIDOL (ISOVUE-370) INJECTION 76% COMPARISON:  Same day CXR, chest CT 03/09/2018 FINDINGS: Cardiovascular: Coronary arteriosclerosis of the top-normal heart size. Conventional branch pattern of the great vessels with atherosclerosis. No aneurysm or dissection. Atherosclerosis of the thoracic aorta without aneurysm or dissection. No significant pericardial effusion or thickening. No large central pulmonary embolus. Mediastinum/Nodes: No mediastinal or hilar adenopathy. The trachea and mainstem bronchi appear patent. The CT of appearance of the esophagus is unremarkable. Lungs/Pleura: Centrilobular and paraseptal emphysema with bullous disease at the apices, right greater than left. Scarring and retraction in the medial right upper lobe appears stable to less prominent. Atelectasis and/or scarring in the  right middle lobe with partial re-expansion of previously noted right middle lobe collapse. With peribronchial thickening, mucous impaction and patchy airspace disease noted in the right lower lobe. Peribronchial thickening to the left lower lobe with minimal areas of mucous impaction or also noted without significant consolidations. Subsegmental atelectasis and/or scarring is seen at the left lung base. No effusion or pneumothorax. No dominant mass. Upper Abdomen: No acute abnormality. Musculoskeletal: Mild degenerative change along the dorsal spine without acute nor aggressive osseous lesions. Review of the MIP images confirms the above findings. IMPRESSION: 1. No acute pulmonary embolus, aortic aneurysm or dissection. 2. Centrilobular and paraseptal emphysema with bullous disease at the apices, right greater than left. 3. Partial re-expansion of previously noted right middle lobe collapse. Patchy airspace disease in the right lower lobe with peribronchial thickening, mucous impaction with similar but less extensive findings in the left lower lobe. 4. Right upper lobe medial parenchymal scarring is less prominent than on prior. 5. Coronary arteriosclerosis. Aortic Atherosclerosis (ICD10-I70.0) and Emphysema (ICD10-J43.9). Electronically Signed   By: Ashley Royalty M.D.   On: 05/05/2018 20:06   Dg Chest Portable 1 View  Result Date: 05/05/2018 CLINICAL DATA:  Chest pain and shortness of breath since last week. History of pneumonia and lung cancer. EXAM: PORTABLE CHEST 1 VIEW COMPARISON:  Radiographs 12/10/2017.  CT 03/09/2018. FINDINGS: 1642 hours. Two views obtained. The heart size and mediastinal contours are stable. The lungs are hyperinflated. Chronic opacity medially at the right apex is improved from the prior radiographs and similar to the more recent CT. Chronic volume loss and opacity in the right middle lobe appear unchanged. There is slightly increased opacity in both lung bases, likely atelectasis. No  evidence of pneumothorax or significant pleural effusion. The bones appear unchanged. IMPRESSION: Mildly increased bibasilar opacities, probably atelectasis. Underlying right apical and right middle lobe opacities appear unchanged from recent prior studies. Electronically Signed   By: Richardean Sale M.D.   On: 05/05/2018 16:56    Micro  Results   Recent Results (from the past 240 hour(s))  Culture, blood (Routine x 2)     Status: None (Preliminary result)   Collection Time: 05/05/18  3:51 PM  Result Value Ref Range Status   Specimen Description RIGHT ANTECUBITAL  Final   Special Requests   Final    BOTTLES DRAWN AEROBIC AND ANAEROBIC Blood Culture adequate volume   Culture   Final    NO GROWTH 2 DAYS Performed at [facility], [address], [place], [place] [address]    Report Status PENDING  Incomplete  Culture, blood (Routine x 2)     Status: None (Preliminary result)   Collection Time: 05/05/18  4:06 PM  Result Value Ref Range Status   Specimen Description BLOOD RIGHT FOREARM  Final   Special Requests   Final    BOTTLES DRAWN AEROBIC AND ANAEROBIC Blood Culture adequate volume   Culture   Final    NO GROWTH 2 DAYS Performed at [facility], [address], [place], [place] [address]    Report Status PENDING  Incomplete  MRSA PCR Screening     Status: None   Collection Time: 05/07/18  7:51 AM  Result Value Ref Range Status   MRSA by PCR NEGATIVE NEGATIVE Final    Comment:        The GeneXpert MRSA Assay (FDA approved for NASAL specimens only), is one component of a comprehensive MRSA colonization surveillance program. It is not intended to diagnose MRSA infection nor to guide or monitor treatment for MRSA infections. Performed at [facility], [address], [place], [place] [address]        Today   King today has no new complaints, no fevers, shortness of breath and cough continues to improve, patient sister at bedside, questions  answered,  Patient states that she is interested in smoking cessation, understands that smoking cessation will help her be more compliant with continuous oxygen use          Patient has been seen and examined prior to discharge   Objective   Blood pressure 126/80, pulse (!) 105, temperature 98.5 F (36.9 C), temperature source Oral, resp. rate 20, height 5\' 7"  (1.702 m), weight 66.1 kg, SpO2 96 %.   Intake/Output Summary (Last 24 hours) at 05/08/2018 1302 Last data filed at 05/08/2018 0900 Gross per 24 hour  Intake 240 ml  Output 350 ml  Net -110 ml    Exam Gen:- Awake Alert, no acute distress , speaking in complete sentences HEENT:- [place].AT, No sclera icterus Neck-Supple Neck,No JVD,.  Nose- 2L/min Lungs-improving air movement, no wheezing, no rales  CV- S1, S2 normal, regular Abd-  +ve B.Sounds, Abd Soft, No tenderness,    Extremity/Skin:- No  edema,   good pulses Psych-affect is appropriate, oriented x3 Neuro-no new focal deficits, no tremors    Data Review   CBC w Diff:  Lab Results  Component Value Date   WBC 7.6 05/06/2018   HGB 11.5 (L) 05/06/2018   HGB 14.3 04/12/2018   HGB 14.3 04/09/2017   HCT 34.7 (L) 05/06/2018   HCT 42.7 04/09/2017   PLT 233 05/06/2018   PLT 248 04/12/2018   PLT 321 04/09/2017   LYMPHOPCT 15 05/05/2018   LYMPHOPCT 22.5 04/09/2017   MONOPCT 8 05/05/2018   MONOPCT 7.0 04/09/2017   EOSPCT 1 05/05/2018   EOSPCT 1.3 04/09/2017   BASOPCT 0 05/05/2018   BASOPCT 0.8 04/09/2017    CMP:  Lab  Results  Component Value Date   NA 140 05/06/2018   NA 144 04/09/2017   K 3.9 05/06/2018   K 4.3 04/09/2017   CL 107 05/06/2018   CO2 24 05/06/2018   CO2 25 04/09/2017   BUN 8 05/06/2018   BUN 16.8 04/09/2017   CREATININE 0.54 05/06/2018   CREATININE 0.81 04/12/2018   CREATININE 0.8 04/09/2017   PROT 7.8 05/05/2018   PROT 6.8 04/09/2017   ALBUMIN 4.0 05/05/2018   ALBUMIN 3.5 04/09/2017   BILITOT 0.4 05/05/2018   BILITOT 0.3 04/12/2018    BILITOT 0.22 04/09/2017   ALKPHOS 191 (H) 05/05/2018   ALKPHOS 216 (H) 04/09/2017   AST 27 05/05/2018   AST 18 04/12/2018   AST 12 04/09/2017   ALT 19 05/05/2018   ALT 11 04/12/2018   ALT 13 04/09/2017  .   Total Discharge time is about 33 minutes  Roxan Hockey M.D on 05/08/2018 at 1:02 PM  Go to [email or web address] -  for contact info  [facility] - Office  [phone]

## 2018-05-08 NOTE — Progress Notes (Signed)
Patient's IV catheter removed and intact. Pt's IV site clean dry and intact. Discharge instructions including medications and follow up appointments were reviewed and discussed with patient. All questions were answered and no further questions at this time. Pt escorted by nurse tech.

## 2018-05-08 NOTE — Discharge Instructions (Signed)
1)Avoid ibuprofen/Advil/Aleve/Motrin/Goody Powders/Naproxen/BC powders/Meloxicam/Diclofenac/Indomethacin and other Nonsteroidal anti-inflammatory medications as these will make you more likely to bleed and can cause stomach ulcers, can also cause Kidney problems.  2) smoking cessation strongly advised 3) use oxygen continuously via nasal cannula at 2 to 2 L/min as advised 4) high risk of burn, self injury and Death if you smoke around oxygen 5) follow-up with oncologist Dr. Earlie Server on Monday, 05/10/2018

## 2018-05-09 LAB — LEGIONELLA PNEUMOPHILA SEROGP 1 UR AG: L. PNEUMOPHILA SEROGP 1 UR AG: NEGATIVE

## 2018-05-10 ENCOUNTER — Encounter: Payer: Self-pay | Admitting: Internal Medicine

## 2018-05-10 ENCOUNTER — Inpatient Hospital Stay: Payer: Medicare HMO

## 2018-05-10 ENCOUNTER — Other Ambulatory Visit: Payer: Self-pay | Admitting: *Deleted

## 2018-05-10 ENCOUNTER — Inpatient Hospital Stay: Payer: Medicare HMO | Attending: Oncology | Admitting: Internal Medicine

## 2018-05-10 ENCOUNTER — Telehealth: Payer: Self-pay | Admitting: Internal Medicine

## 2018-05-10 VITALS — BP 116/82 | HR 98 | Temp 99.3°F | Resp 16 | Ht 67.0 in | Wt 144.2 lb

## 2018-05-10 DIAGNOSIS — C342 Malignant neoplasm of middle lobe, bronchus or lung: Secondary | ICD-10-CM

## 2018-05-10 DIAGNOSIS — C3491 Malignant neoplasm of unspecified part of right bronchus or lung: Secondary | ICD-10-CM

## 2018-05-10 DIAGNOSIS — Z720 Tobacco use: Secondary | ICD-10-CM

## 2018-05-10 DIAGNOSIS — Z23 Encounter for immunization: Secondary | ICD-10-CM | POA: Insufficient documentation

## 2018-05-10 DIAGNOSIS — I10 Essential (primary) hypertension: Secondary | ICD-10-CM | POA: Insufficient documentation

## 2018-05-10 DIAGNOSIS — Z0000 Encounter for general adult medical examination without abnormal findings: Secondary | ICD-10-CM

## 2018-05-10 DIAGNOSIS — Z5112 Encounter for antineoplastic immunotherapy: Secondary | ICD-10-CM | POA: Insufficient documentation

## 2018-05-10 LAB — CBC WITH DIFFERENTIAL (CANCER CENTER ONLY)
ABS IMMATURE GRANULOCYTES: 0.14 10*3/uL — AB (ref 0.00–0.07)
Basophils Absolute: 0 10*3/uL (ref 0.0–0.1)
Basophils Relative: 0 %
Eosinophils Absolute: 0.2 10*3/uL (ref 0.0–0.5)
Eosinophils Relative: 3 %
HCT: 39.7 % (ref 36.0–46.0)
Hemoglobin: 13.3 g/dL (ref 12.0–15.0)
Immature Granulocytes: 2 %
LYMPHS PCT: 27 %
Lymphs Abs: 2.1 10*3/uL (ref 0.7–4.0)
MCH: 27.9 pg (ref 26.0–34.0)
MCHC: 33.5 g/dL (ref 30.0–36.0)
MCV: 83.4 fL (ref 80.0–100.0)
Monocytes Absolute: 0.6 10*3/uL (ref 0.1–1.0)
Monocytes Relative: 8 %
Neutro Abs: 4.4 10*3/uL (ref 1.7–7.7)
Neutrophils Relative %: 60 %
Platelet Count: 405 10*3/uL — ABNORMAL HIGH (ref 150–400)
RBC: 4.76 MIL/uL (ref 3.87–5.11)
RDW: 16.2 % — ABNORMAL HIGH (ref 11.5–15.5)
WBC Count: 7.5 10*3/uL (ref 4.0–10.5)
nRBC: 0 % (ref 0.0–0.2)

## 2018-05-10 LAB — CMP (CANCER CENTER ONLY)
ALBUMIN: 3.2 g/dL — AB (ref 3.5–5.0)
ALT: 76 U/L — ABNORMAL HIGH (ref 0–44)
AST: 43 U/L — ABNORMAL HIGH (ref 15–41)
Alkaline Phosphatase: 207 U/L — ABNORMAL HIGH (ref 38–126)
Anion gap: 11 (ref 5–15)
BUN: 15 mg/dL (ref 8–23)
CO2: 29 mmol/L (ref 22–32)
Calcium: 9.8 mg/dL (ref 8.9–10.3)
Chloride: 103 mmol/L (ref 98–111)
Creatinine: 0.65 mg/dL (ref 0.44–1.00)
GFR, Est AFR Am: >60 mL/min (ref >60)
GFR, Estimated: >60 mL/min (ref >60)
GLUCOSE: 73 mg/dL (ref 70–99)
Potassium: 4.2 mmol/L (ref 3.5–5.1)
Sodium: 143 mmol/L (ref 135–145)
Total Bilirubin: <0.2 mg/dL — ABNORMAL LOW (ref 0.3–1.2)
Total Protein: 7.4 g/dL (ref 6.5–8.1)

## 2018-05-10 LAB — CULTURE, BLOOD (ROUTINE X 2)
Culture: NO GROWTH
Culture: NO GROWTH
SPECIAL REQUESTS: ADEQUATE
Special Requests: ADEQUATE

## 2018-05-10 MED ORDER — SODIUM CHLORIDE 0.9 % IV SOLN
Freq: Once | INTRAVENOUS | Status: AC
  Administered 2018-05-10: 11:00:00 via INTRAVENOUS
  Filled 2018-05-10: qty 250

## 2018-05-10 MED ORDER — SODIUM CHLORIDE 0.9 % IV SOLN
480.0000 mg | Freq: Once | INTRAVENOUS | Status: AC
  Administered 2018-05-10: 480 mg via INTRAVENOUS
  Filled 2018-05-10: qty 48

## 2018-05-10 MED ORDER — INFLUENZA VAC SPLIT HIGH-DOSE 0.5 ML IM SUSY
0.5000 mL | PREFILLED_SYRINGE | Freq: Once | INTRAMUSCULAR | Status: AC
  Administered 2018-05-10: 0.5 mL via INTRAMUSCULAR
  Filled 2018-05-10: qty 0.5

## 2018-05-10 NOTE — Progress Notes (Signed)
[place] [phone]   [phone]  OFFICE PROGRESS NOTE  Barry Dienes, NP No address on file  DIAGNOSIS: Recurrent non-small cell lung cancer initially diagnosed as stage IIIA (T1b, N2, M0) non-small cell lung cancer presented with right middle lobe pulmonary nodule, mediastinal lymphadenopathy and highly suspicious for small nodule in the left upper lobe that could change her stage to stage IV that could present another synchronous primary lesion in the left upper lobe. This was diagnosed in September 2017.  PRIOR THERAPY:  1) Concurrent chemoradiation with weekly carboplatin for AUC of 2 and paclitaxel 45 MG/M2 status post 6 cycles last dose was given 02/25/2016 with partial response. 2) Consolidation chemotherapy with carboplatin for AUC of 5 and paclitaxel 175 MG/M2 every 3 weeks with Neulasta support. First dose 04/22/2016. Status post 3 cycles.  CURRENT THERAPY: Second line treatment with immunotherapy with Nivolumab 480 mg IV every 4 weeks status post 13 cycles..  INTERVAL HISTORY: Courtney Zuniga 68 y.o. female returns to the clinic today for follow-up visit.  The patient is feeling fine today with no concerning complaints.  She denied having any chest pain, shortness of breath, cough or hemoptysis.  She denied having any recent weight loss or night sweats.  She has no nausea, vomiting, diarrhea or constipation.  She denied having any headache or visual changes.  The patient continues to tolerate her treatment with nivolumab fairly well.  She had repeat CT scan of the chest performed recently and she is here for evaluation and discussion of her scan results.   MEDICAL HISTORY: Past Medical History:  Diagnosis Date  . Anemia    as a young woman  . Arthritis    osteoartritis  . Asthma   . Brain tumor (benign) ([place]) 2005 [facility]   Benign  . Chronic headaches   . Chronic hip pain   . Chronic pain   . COPD (chronic obstructive pulmonary  disease) ([place])   . Coronary artery disease   . Depression   . Depression 05/15/2016  . Encounter for antineoplastic chemotherapy 01/10/2016  . GERD (gastroesophageal reflux disease)   . Hypertension   . Lung cancer ([place]) dx'd 01/2016   currently on chemo and radiation   . NSTEMI (non-ST elevated myocardial infarction) ([place]) yrs ago  . On home O2    qhs 2 liters at hs and prn  . Pneumonia last time 2 yrs ago  . Shortness of breath dyspnea    with activity    ALLERGIES:  has No Known Allergies.  MEDICATIONS:  Current Outpatient Medications  Medication Sig Dispense Refill  . acetaminophen (TYLENOL) 325 MG tablet Take 2 tablets (650 mg total) by mouth every 6 (six) hours as needed for mild pain, fever or headache. 12 tablet 2  . albuterol (PROVENTIL HFA;VENTOLIN HFA) 108 (90 BASE) MCG/ACT inhaler Inhale 2 puffs into the lungs every 4 (four) hours as needed for shortness of breath.     Marland Kitchen azithromycin (ZITHROMAX) 500 MG tablet Take 1 tablet (500 mg total) by mouth daily for 5 days. 5 tablet 0  . BREO ELLIPTA 100-25 MCG/INH AEPB Inhale 1 puff into the lungs daily.     . cefdinir (OMNICEF) 300 MG capsule Take 1 capsule (300 mg total) by mouth 2 (two) times daily for 5 days. 10 capsule 0  . Cholecalciferol (VITAMIN D3) 125 MCG (5000 UT) CAPS Take 1 capsule by mouth daily.  0  . cyclobenzaprine (FLEXERIL) 10 MG tablet Take  1 tablet (10 mg total) by mouth 2 (two) times daily. *MAy take one additional tablet as needed for muscle spasms 60 tablet 2  . diclofenac sodium (VOLTAREN) 1 % GEL Apply topically 4 (four) times daily as needed (massge gel into affected area(s) as needed for pain).   1  . ENSURE (ENSURE) Take 237 mLs by mouth 3 (three) times daily between meals.    Marland Kitchen ipratropium-albuterol (DUONEB) 0.5-2.5 (3) MG/3ML SOLN Take 3 mLs by nebulization 3 (three) times daily. 360 mL 2  . metoprolol tartrate (LOPRESSOR) 25 MG tablet Take 1 tablet (25 mg total) by mouth 2 (two) times daily. 60 tablet 1   . nicotine (NICODERM CQ - DOSED IN MG/24 HOURS) 14 mg/24hr patch Place 1 patch (14 mg total) onto the skin daily. 28 patch 0  . oxyCODONE-acetaminophen (PERCOCET/ROXICET) 5-325 MG tablet Take 1 tablet by mouth every 8 (eight) hours as needed for moderate pain. 12 tablet 0  . pantoprazole (PROTONIX) 40 MG tablet Take 1 tablet (40 mg total) by mouth daily. 30 tablet 3  . predniSONE (DELTASONE) 20 MG tablet Take 2 tablets (40 mg total) by mouth daily with breakfast. 5 tablet 0  . ROBITUSSIN 12 HOUR COUGH 30 MG/5ML liquid Take 30 mg by mouth every 4 (four) hours as needed for cough.      No current facility-administered medications for this visit.     SURGICAL HISTORY:  Past Surgical History:  Procedure Laterality Date  . CHOLECYSTECTOMY    . COLONOSCOPY  2015   Results requested from [facility]  . COLONOSCOPY    . ESOPHAGOGASTRODUODENOSCOPY N/A 08/14/2015   Procedure: ESOPHAGOGASTRODUODENOSCOPY (EGD);  Surgeon: Daneil Dolin, MD;  Location: [facility];  Service: Endoscopy;  Laterality: N/A;  215   . ESOPHAGOGASTRODUODENOSCOPY (EGD) WITH PROPOFOL N/A 09/13/2015   Procedure: ESOPHAGOGASTRODUODENOSCOPY (EGD) WITH PROPOFOL;  Surgeon: Milus Banister, MD;  Location: [facility];  Service: Endoscopy;  Laterality: N/A;  . EUS N/A 03/12/2017   Procedure: UPPER ENDOSCOPIC ULTRASOUND (EUS) RADIAL;  Surgeon: Milus Banister, MD;  Location: [facility];  Service: Endoscopy;  Laterality: N/A;  . TUMOR REMOVAL  2005   Benign  . UPPER ESOPHAGEAL ENDOSCOPIC ULTRASOUND (EUS)  09/13/2015   Procedure: UPPER ESOPHAGEAL ENDOSCOPIC ULTRASOUND (EUS);  Surgeon: Milus Banister, MD;  Location: [facility];  Service: Endoscopy;;  . VIDEO BRONCHOSCOPY WITH ENDOBRONCHIAL NAVIGATION N/A 12/31/2015   Procedure: VIDEO BRONCHOSCOPY WITH ENDOBRONCHIAL NAVIGATION;  Surgeon: Melrose Nakayama, MD;  Location: [place];  Service: Thoracic;  Laterality: N/A;  . VIDEO BRONCHOSCOPY WITH ENDOBRONCHIAL ULTRASOUND N/A  11/08/2015   Procedure: VIDEO BRONCHOSCOPY WITH ENDOBRONCHIAL ULTRASOUND;  Surgeon: Ivin Poot, MD;  Location: [place];  Service: Thoracic;  Laterality: N/A;    REVIEW OF SYSTEMS:  Constitutional: negative Eyes: negative Ears, nose, mouth, throat, and face: negative Respiratory: positive for dyspnea on exertion Cardiovascular: negative Gastrointestinal: negative Genitourinary:negative Integument/breast: negative Hematologic/lymphatic: negative Musculoskeletal:negative Neurological: negative Behavioral/Psych: negative Endocrine: negative Allergic/Immunologic: negative   PHYSICAL EXAMINATION: General appearance: alert, cooperative and no distress Head: Normocephalic, without obvious abnormality, atraumatic Neck: no adenopathy, no JVD, supple, symmetrical, trachea midline and thyroid not enlarged, symmetric, no tenderness/mass/nodules Lymph nodes: Cervical, supraclavicular, and axillary nodes normal. Resp: clear to auscultation bilaterally Back: symmetric, no curvature. ROM normal. No CVA tenderness. Cardio: regular rate and rhythm, S1, S2 normal, no murmur, click, rub or gallop GI: soft, non-tender; bowel sounds normal; no masses,  no organomegaly Extremities: extremities normal, atraumatic, no cyanosis or edema  Neurologic: Alert and oriented X 3, normal strength and tone. Normal symmetric reflexes. Normal coordination and gait  ECOG PERFORMANCE STATUS: 1 - Symptomatic but completely ambulatory  Blood pressure 116/82, pulse 98, temperature 99.3 F (37.4 C), temperature source Oral, resp. rate 16, height 5\' 7"  (1.702 m), weight 144 lb 3.2 oz (65.4 kg), SpO2 98 %.  LABORATORY DATA: Lab Results  Component Value Date   WBC 7.5 05/10/2018   HGB 13.3 05/10/2018   HCT 39.7 05/10/2018   MCV 83.4 05/10/2018   PLT 405 (H) 05/10/2018      Chemistry      Component Value Date/Time   NA 140 05/06/2018 0418   NA 144 04/09/2017 0948   K 3.9 05/06/2018 0418   K 4.3 04/09/2017 0948     CL 107 05/06/2018 0418   CO2 24 05/06/2018 0418   CO2 25 04/09/2017 0948   BUN 8 05/06/2018 0418   BUN 16.8 04/09/2017 0948   CREATININE 0.54 05/06/2018 0418   CREATININE 0.81 04/12/2018 0819   CREATININE 0.8 04/09/2017 0948      Component Value Date/Time   CALCIUM 8.6 (L) 05/06/2018 0418   CALCIUM 9.1 04/09/2017 0948   ALKPHOS 191 (H) 05/05/2018 1550   ALKPHOS 216 (H) 04/09/2017 0948   AST 27 05/05/2018 1550   AST 18 04/12/2018 0819   AST 12 04/09/2017 0948   ALT 19 05/05/2018 1550   ALT 11 04/12/2018 0819   ALT 13 04/09/2017 0948   BILITOT 0.4 05/05/2018 1550   BILITOT 0.3 04/12/2018 0819   BILITOT 0.22 04/09/2017 0948       RADIOGRAPHIC STUDIES: Ct Angio Chest Pe W And/or Wo Contrast  Result Date: 05/05/2018 CLINICAL DATA:  Chest pain and dyspnea last week. No relief with over the counter cold medicines. Unknown fever. Congestion and headaches. EXAM: CT ANGIOGRAPHY CHEST WITH CONTRAST TECHNIQUE: Multidetector CT imaging of the chest was performed using the standard protocol during bolus administration of intravenous contrast. Multiplanar CT image reconstructions and MIPs were obtained to evaluate the vascular anatomy. CONTRAST:  147mL ISOVUE-370 IOPAMIDOL (ISOVUE-370) INJECTION 76% COMPARISON:  Same day CXR, chest CT 03/09/2018 FINDINGS: Cardiovascular: Coronary arteriosclerosis of the top-normal heart size. Conventional branch pattern of the great vessels with atherosclerosis. No aneurysm or dissection. Atherosclerosis of the thoracic aorta without aneurysm or dissection. No significant pericardial effusion or thickening. No large central pulmonary embolus. Mediastinum/Nodes: No mediastinal or hilar adenopathy. The trachea and mainstem bronchi appear patent. The CT of appearance of the esophagus is unremarkable. Lungs/Pleura: Centrilobular and paraseptal emphysema with bullous disease at the apices, right greater than left. Scarring and retraction in the medial right upper lobe  appears stable to less prominent. Atelectasis and/or scarring in the right middle lobe with partial re-expansion of previously noted right middle lobe collapse. With peribronchial thickening, mucous impaction and patchy airspace disease noted in the right lower lobe. Peribronchial thickening to the left lower lobe with minimal areas of mucous impaction or also noted without significant consolidations. Subsegmental atelectasis and/or scarring is seen at the left lung base. No effusion or pneumothorax. No dominant mass. Upper Abdomen: No acute abnormality. Musculoskeletal: Mild degenerative change along the dorsal spine without acute nor aggressive osseous lesions. Review of the MIP images confirms the above findings. IMPRESSION: 1. No acute pulmonary embolus, aortic aneurysm or dissection. 2. Centrilobular and paraseptal emphysema with bullous disease at the apices, right greater than left. 3. Partial re-expansion of previously noted right middle lobe collapse. Patchy airspace disease in the right  lower lobe with peribronchial thickening, mucous impaction with similar but less extensive findings in the left lower lobe. 4. Right upper lobe medial parenchymal scarring is less prominent than on prior. 5. Coronary arteriosclerosis. Aortic Atherosclerosis (ICD10-I70.0) and Emphysema (ICD10-J43.9). Electronically Signed   By: Ashley Royalty M.D.   On: 05/05/2018 20:06   Dg Chest Portable 1 View  Result Date: 05/05/2018 CLINICAL DATA:  Chest pain and shortness of breath since last week. History of pneumonia and lung cancer. EXAM: PORTABLE CHEST 1 VIEW COMPARISON:  Radiographs 12/10/2017.  CT 03/09/2018. FINDINGS: 1642 hours. Two views obtained. The heart size and mediastinal contours are stable. The lungs are hyperinflated. Chronic opacity medially at the right apex is improved from the prior radiographs and similar to the more recent CT. Chronic volume loss and opacity in the right middle lobe appear unchanged. There is  slightly increased opacity in both lung bases, likely atelectasis. No evidence of pneumothorax or significant pleural effusion. The bones appear unchanged. IMPRESSION: Mildly increased bibasilar opacities, probably atelectasis. Underlying right apical and right middle lobe opacities appear unchanged from recent prior studies. Electronically Signed   By: Richardean Sale M.D.   On: 05/05/2018 16:56    ASSESSMENT AND PLAN:  This is a very pleasant 68 years old African-American female with a recurrent non-small cell lung cancer initially diagnosed as stage IIIA non-small cell lung cancer status post concurrent chemoradiation followed by consolidation chemotherapy with carboplatin and paclitaxel for 3 cycles. The patient was followed by observation but restaging imaging studies showed evidence for disease progression. She was started on second line treatment with immunotherapy with Nivolumab 480 mg IV every 4 weeks status post 13 cycles.   The patient continues to tolerate her treatment well with no concerning adverse effects. She had a repeat CT scan of the chest performed recently.  I personally and independently reviewed the scans and discussed the results with the patient today. Her scan showed no concerning findings for disease progression. I recommended for the patient to continue her current treatment with nivolumab and she will proceed with cycle #14 today. I will see the patient back for follow-up visit in 4 weeks for evaluation before starting cycle #15. The patient will receive flu vaccine in the clinic today. For hypertension she was advised to take her blood pressure medication as prescribed. For smoking cessation I strongly encouraged her to quit smoking. The patient was advised to call immediately if she has any concerning symptoms in the interval. The patient voices understanding of current disease status and treatment options and is in agreement with the current care plan. All questions  were answered. The patient knows to call the clinic with any problems, questions or concerns. We can certainly see the patient much sooner if necessary.  Disclaimer: This note was dictated with voice recognition software. Similar sounding words can inadvertently be transcribed and may not be corrected upon review.

## 2018-05-10 NOTE — Progress Notes (Signed)
MD aware of Prednisone dose. Pt is nearly finished with Prednisone rx. Ok to treat with Opdivo today.  Hardie Pulley, PharmD, BCPS, BCOP

## 2018-05-10 NOTE — Progress Notes (Signed)
MD aware of antibiotics. Ok to proceed with treatment.  Hardie Pulley, PharmD, BCPS, BCOP

## 2018-05-10 NOTE — Telephone Encounter (Signed)
Scheduled appt per 01/27 los.  Printed calendar and avs.

## 2018-05-10 NOTE — Patient Instructions (Addendum)
[facility] Discharge Instructions for Patients Receiving Chemotherapy  Today you received the following chemotherapy agents Nivolumab  To help prevent nausea and vomiting after your treatment, we encourage you to take your nausea medication as directed If you develop nausea and vomiting that is not controlled by your nausea medication, call the clinic.   BELOW ARE SYMPTOMS THAT SHOULD BE REPORTED IMMEDIATELY:  *FEVER GREATER THAN 100.5 F  *CHILLS WITH OR WITHOUT FEVER  NAUSEA AND VOMITING THAT IS NOT CONTROLLED WITH YOUR NAUSEA MEDICATION  *UNUSUAL SHORTNESS OF BREATH  *UNUSUAL BRUISING OR BLEEDING  TENDERNESS IN MOUTH AND THROAT WITH OR WITHOUT PRESENCE OF ULCERS  *URINARY PROBLEMS  *BOWEL PROBLEMS  UNUSUAL RASH Items with * indicate a potential emergency and should be followed up as soon as possible.  Feel free to call the clinic should you have any questions or concerns. The clinic phone number is [phone].  Please show the [place] at check-in to the Emergency Department and triage nurse.  Influenza Virus Vaccine (Flucelvax) What is this medicine? INFLUENZA VIRUS VACCINE (in floo EN zuh VAHY ruhs vak SEEN) helps to reduce the risk of getting influenza also known as the flu. The vaccine only helps protect you against some strains of the flu. This medicine may be used for other purposes; ask your health care provider or pharmacist if you have questions. COMMON BRAND NAME(S): FLUCELVAX What should I tell my health care provider before I take this medicine? They need to know if you have any of these conditions: -bleeding disorder like hemophilia -fever or infection -Guillain-Barre syndrome or other neurological problems -immune system problems -infection with the human immunodeficiency virus (HIV) or AIDS -low blood platelet counts -multiple sclerosis -an unusual or allergic reaction to influenza virus vaccine, other medicines, foods, dyes  or preservatives -pregnant or trying to get pregnant -breast-feeding How should I use this medicine? This vaccine is for injection into a muscle. It is given by a health care professional. A copy of Vaccine Information Statements will be given before each vaccination. Read this sheet carefully each time. The sheet may change frequently. Talk to your pediatrician regarding the use of this medicine in children. Special care may be needed. Overdosage: If you think you've taken too much of this medicine contact a poison control center or emergency room at once. Overdosage: If you think you have taken too much of this medicine contact a poison control center or emergency room at once. NOTE: This medicine is only for you. Do not share this medicine with others. What if I miss a dose? This does not apply. What may interact with this medicine? -chemotherapy or radiation therapy -medicines that lower your immune system like etanercept, anakinra, infliximab, and adalimumab -medicines that treat or prevent blood clots like warfarin -phenytoin -steroid medicines like prednisone or cortisone -theophylline -vaccines This list may not describe all possible interactions. Give your health care provider a list of all the medicines, herbs, non-prescription drugs, or dietary supplements you use. Also tell them if you smoke, drink alcohol, or use illegal drugs. Some items may interact with your medicine. What should I watch for while using this medicine? Report any side effects that do not go away within 3 days to your doctor or health care professional. Call your health care provider if any unusual symptoms occur within 6 weeks of receiving this vaccine. You may still catch the flu, but the illness is not usually as bad. You cannot get the flu from  the vaccine. The vaccine will not protect against colds or other illnesses that may cause fever. The vaccine is needed every year. What side effects may I notice from  receiving this medicine? Side effects that you should report to your doctor or health care professional as soon as possible: -allergic reactions like skin rash, itching or hives, swelling of the face, lips, or tongue Side effects that usually do not require medical attention (Report these to your doctor or health care professional if they continue or are bothersome.): -fever -headache -muscle aches and pains -pain, tenderness, redness, or swelling at the injection site -tiredness This list may not describe all possible side effects. Call your doctor for medical advice about side effects. You may report side effects to FDA at 1-800-FDA-1088. Where should I keep my medicine? The vaccine will be given by a health care professional in a clinic, pharmacy, doctor's office, or other health care setting. You will not be given vaccine doses to store at home. NOTE: This sheet is a summary. It may not cover all possible information. If you have questions about this medicine, talk to your doctor, pharmacist, or health care provider.  2019 Elsevier/Gold Standard (2011-03-12 14:06:47)

## 2018-06-04 ENCOUNTER — Other Ambulatory Visit: Payer: Self-pay | Admitting: *Deleted

## 2018-06-04 DIAGNOSIS — Z5112 Encounter for antineoplastic immunotherapy: Secondary | ICD-10-CM

## 2018-06-07 ENCOUNTER — Inpatient Hospital Stay (HOSPITAL_BASED_OUTPATIENT_CLINIC_OR_DEPARTMENT_OTHER): Payer: Medicare HMO | Admitting: Internal Medicine

## 2018-06-07 ENCOUNTER — Inpatient Hospital Stay: Payer: Medicare HMO

## 2018-06-07 ENCOUNTER — Inpatient Hospital Stay: Payer: Medicare HMO | Attending: Oncology

## 2018-06-07 ENCOUNTER — Encounter: Payer: Self-pay | Admitting: Internal Medicine

## 2018-06-07 ENCOUNTER — Telehealth: Payer: Self-pay | Admitting: Internal Medicine

## 2018-06-07 ENCOUNTER — Other Ambulatory Visit: Payer: Self-pay | Admitting: Medical Oncology

## 2018-06-07 VITALS — BP 126/85 | HR 103 | Temp 98.1°F | Resp 18 | Ht 67.0 in | Wt 141.8 lb

## 2018-06-07 VITALS — HR 95

## 2018-06-07 DIAGNOSIS — Z5112 Encounter for antineoplastic immunotherapy: Secondary | ICD-10-CM

## 2018-06-07 DIAGNOSIS — C3491 Malignant neoplasm of unspecified part of right bronchus or lung: Secondary | ICD-10-CM

## 2018-06-07 DIAGNOSIS — C342 Malignant neoplasm of middle lobe, bronchus or lung: Secondary | ICD-10-CM | POA: Diagnosis not present

## 2018-06-07 DIAGNOSIS — Z79899 Other long term (current) drug therapy: Secondary | ICD-10-CM | POA: Insufficient documentation

## 2018-06-07 DIAGNOSIS — I10 Essential (primary) hypertension: Secondary | ICD-10-CM

## 2018-06-07 LAB — CBC WITH DIFFERENTIAL (CANCER CENTER ONLY)
Abs Immature Granulocytes: 0.02 10*3/uL (ref 0.00–0.07)
Basophils Absolute: 0 10*3/uL (ref 0.0–0.1)
Basophils Relative: 1 %
Eosinophils Absolute: 0.1 10*3/uL (ref 0.0–0.5)
Eosinophils Relative: 3 %
HCT: 41.9 % (ref 36.0–46.0)
Hemoglobin: 14.1 g/dL (ref 12.0–15.0)
Immature Granulocytes: 0 %
Lymphocytes Relative: 23 %
Lymphs Abs: 1.3 10*3/uL (ref 0.7–4.0)
MCH: 28.3 pg (ref 26.0–34.0)
MCHC: 33.7 g/dL (ref 30.0–36.0)
MCV: 84 fL (ref 80.0–100.0)
MONO ABS: 0.5 10*3/uL (ref 0.1–1.0)
Monocytes Relative: 9 %
Neutro Abs: 3.7 10*3/uL (ref 1.7–7.7)
Neutrophils Relative %: 64 %
Platelet Count: 240 10*3/uL (ref 150–400)
RBC: 4.99 MIL/uL (ref 3.87–5.11)
RDW: 16 % — ABNORMAL HIGH (ref 11.5–15.5)
WBC Count: 5.6 10*3/uL (ref 4.0–10.5)
nRBC: 0 % (ref 0.0–0.2)

## 2018-06-07 LAB — CMP (CANCER CENTER ONLY)
ALT: 9 U/L (ref 0–44)
AST: 19 U/L (ref 15–41)
Albumin: 3.8 g/dL (ref 3.5–5.0)
Alkaline Phosphatase: 255 U/L — ABNORMAL HIGH (ref 38–126)
Anion gap: 11 (ref 5–15)
BUN: 17 mg/dL (ref 8–23)
CO2: 27 mmol/L (ref 22–32)
Calcium: 9.5 mg/dL (ref 8.9–10.3)
Chloride: 103 mmol/L (ref 98–111)
Creatinine: 0.74 mg/dL (ref 0.44–1.00)
GFR, Est AFR Am: >60 mL/min (ref >60)
GFR, Estimated: >60 mL/min (ref >60)
Glucose, Bld: 72 mg/dL (ref 70–99)
Potassium: 4 mmol/L (ref 3.5–5.1)
Sodium: 141 mmol/L (ref 135–145)
Total Bilirubin: <0.2 mg/dL — ABNORMAL LOW (ref 0.3–1.2)
Total Protein: 7.1 g/dL (ref 6.5–8.1)

## 2018-06-07 LAB — TSH: TSH: 2.782 u[IU]/mL (ref 0.308–3.960)

## 2018-06-07 MED ORDER — SODIUM CHLORIDE 0.9 % IV SOLN
480.0000 mg | Freq: Once | INTRAVENOUS | Status: AC
  Administered 2018-06-07: 480 mg via INTRAVENOUS
  Filled 2018-06-07: qty 48

## 2018-06-07 MED ORDER — SODIUM CHLORIDE 0.9 % IV SOLN
Freq: Once | INTRAVENOUS | Status: AC
  Administered 2018-06-07: 10:00:00 via INTRAVENOUS
  Filled 2018-06-07: qty 250

## 2018-06-07 NOTE — Patient Instructions (Signed)
[facility] Discharge Instructions for Patients Receiving Chemotherapy  Today you received the following chemotherapy agents Nivolumab  To help prevent nausea and vomiting after your treatment, we encourage you to take your nausea medication as directed If you develop nausea and vomiting that is not controlled by your nausea medication, call the clinic.   BELOW ARE SYMPTOMS THAT SHOULD BE REPORTED IMMEDIATELY:  *FEVER GREATER THAN 100.5 F  *CHILLS WITH OR WITHOUT FEVER  NAUSEA AND VOMITING THAT IS NOT CONTROLLED WITH YOUR NAUSEA MEDICATION  *UNUSUAL SHORTNESS OF BREATH  *UNUSUAL BRUISING OR BLEEDING  TENDERNESS IN MOUTH AND THROAT WITH OR WITHOUT PRESENCE OF ULCERS  *URINARY PROBLEMS  *BOWEL PROBLEMS  UNUSUAL RASH Items with * indicate a potential emergency and should be followed up as soon as possible.  Feel free to call the clinic should you have any questions or concerns. The clinic phone number is [phone].  Please show the [facility] at check-in to the Emergency Department and triage nurse.  Influenza Virus Vaccine (Flucelvax) What is this medicine? INFLUENZA VIRUS VACCINE (in floo EN zuh VAHY ruhs vak SEEN) helps to reduce the risk of getting influenza also known as the flu. The vaccine only helps protect you against some strains of the flu. This medicine may be used for other purposes; ask your health care provider or pharmacist if you have questions. COMMON BRAND NAME(S): FLUCELVAX What should I tell my health care provider before I take this medicine? They need to know if you have any of these conditions: -bleeding disorder like hemophilia -fever or infection -Guillain-Barre syndrome or other neurological problems -immune system problems -infection with the human immunodeficiency virus (HIV) or AIDS -low blood platelet counts -multiple sclerosis -an unusual or allergic reaction to influenza virus vaccine, other medicines, foods, dyes  or preservatives -pregnant or trying to get pregnant -breast-feeding How should I use this medicine? This vaccine is for injection into a muscle. It is given by a health care professional. A copy of Vaccine Information Statements will be given before each vaccination. Read this sheet carefully each time. The sheet may change frequently. Talk to your pediatrician regarding the use of this medicine in children. Special care may be needed. Overdosage: If you think you've taken too much of this medicine contact a poison control center or emergency room at once. Overdosage: If you think you have taken too much of this medicine contact a poison control center or emergency room at once. NOTE: This medicine is only for you. Do not share this medicine with others. What if I miss a dose? This does not apply. What may interact with this medicine? -chemotherapy or radiation therapy -medicines that lower your immune system like etanercept, anakinra, infliximab, and adalimumab -medicines that treat or prevent blood clots like warfarin -phenytoin -steroid medicines like prednisone or cortisone -theophylline -vaccines This list may not describe all possible interactions. Give your health care provider a list of all the medicines, herbs, non-prescription drugs, or dietary supplements you use. Also tell them if you smoke, drink alcohol, or use illegal drugs. Some items may interact with your medicine. What should I watch for while using this medicine? Report any side effects that do not go away within 3 days to your doctor or health care professional. Call your health care provider if any unusual symptoms occur within 6 weeks of receiving this vaccine. You may still catch the flu, but the illness is not usually as bad. You cannot get the flu from  the vaccine. The vaccine will not protect against colds or other illnesses that may cause fever. The vaccine is needed every year. What side effects may I notice from  receiving this medicine? Side effects that you should report to your doctor or health care professional as soon as possible: -allergic reactions like skin rash, itching or hives, swelling of the face, lips, or tongue Side effects that usually do not require medical attention (Report these to your doctor or health care professional if they continue or are bothersome.): -fever -headache -muscle aches and pains -pain, tenderness, redness, or swelling at the injection site -tiredness This list may not describe all possible side effects. Call your doctor for medical advice about side effects. You may report side effects to FDA at 1-800-FDA-1088. Where should I keep my medicine? The vaccine will be given by a health care professional in a clinic, pharmacy, doctor's office, or other health care setting. You will not be given vaccine doses to store at home. NOTE: This sheet is a summary. It may not cover all possible information. If you have questions about this medicine, talk to your doctor, pharmacist, or health care provider.  2019 Elsevier/Gold Standard (2011-03-12 14:06:47)

## 2018-06-07 NOTE — Telephone Encounter (Signed)
Scheduled appt per 2/24 los. ° °Printed calendar and avs. °

## 2018-06-07 NOTE — Progress Notes (Signed)
[facility] [phone]   [phone]  OFFICE PROGRESS NOTE  Barry Dienes, NP No address on file  DIAGNOSIS: Recurrent non-small cell lung cancer initially diagnosed as stage IIIA (T1b, N2, M0) non-small cell lung cancer presented with right middle lobe pulmonary nodule, mediastinal lymphadenopathy and highly suspicious for small nodule in the left upper lobe that could change her stage to stage IV that could present another synchronous primary lesion in the left upper lobe. This was diagnosed in September 2017.  PRIOR THERAPY:  1) Concurrent chemoradiation with weekly carboplatin for AUC of 2 and paclitaxel 45 MG/M2 status post 6 cycles last dose was given 02/25/2016 with partial response. 2) Consolidation chemotherapy with carboplatin for AUC of 5 and paclitaxel 175 MG/M2 every 3 weeks with Neulasta support. First dose 04/22/2016. Status post 3 cycles.  CURRENT THERAPY: Second line treatment with immunotherapy with Nivolumab 480 mg IV every 4 weeks status post 14 cycles..  INTERVAL HISTORY: Courtney Zuniga 68 y.o. female returns to the clinic today for follow-up visit.  The patient is feeling fine today with no concerning complaints.  She denied having any chest pain, shortness of breath, cough or hemoptysis.  She denied having any fever or chills.  She has no nausea, vomiting, diarrhea or constipation.  She continues to tolerate her treatment with nivolumab fairly well.  The patient is here today for evaluation before starting cycle #15.  MEDICAL HISTORY: Past Medical History:  Diagnosis Date  . Anemia    as a young woman  . Arthritis    osteoartritis  . Asthma   . Brain tumor (benign) ([place]) 2005 [facility]   Benign  . Chronic headaches   . Chronic hip pain   . Chronic pain   . COPD (chronic obstructive pulmonary disease) ([place])   . Coronary artery disease   . Depression   . Depression 05/15/2016  . Encounter for antineoplastic chemotherapy  01/10/2016  . GERD (gastroesophageal reflux disease)   . Hypertension   . Lung cancer ([place]) dx'd 01/2016   currently on chemo and radiation   . NSTEMI (non-ST elevated myocardial infarction) ([place]) yrs ago  . On home O2    qhs 2 liters at hs and prn  . Pneumonia last time 2 yrs ago  . Shortness of breath dyspnea    with activity    ALLERGIES:  has No Known Allergies.  MEDICATIONS:  Current Outpatient Medications  Medication Sig Dispense Refill  . acetaminophen (TYLENOL) 325 MG tablet Take 2 tablets (650 mg total) by mouth every 6 (six) hours as needed for mild pain, fever or headache. 12 tablet 2  . albuterol (PROVENTIL HFA;VENTOLIN HFA) 108 (90 BASE) MCG/ACT inhaler Inhale 2 puffs into the lungs every 4 (four) hours as needed for shortness of breath.     Marland Kitchen BREO ELLIPTA 100-25 MCG/INH AEPB Inhale 1 puff into the lungs daily.     . Cholecalciferol (VITAMIN D3) 125 MCG (5000 UT) CAPS Take 1 capsule by mouth daily.  0  . cyclobenzaprine (FLEXERIL) 10 MG tablet Take 1 tablet (10 mg total) by mouth 2 (two) times daily. *MAy take one additional tablet as needed for muscle spasms 60 tablet 2  . diclofenac sodium (VOLTAREN) 1 % GEL Apply topically 4 (four) times daily as needed (massge gel into affected area(s) as needed for pain).   1  . ENSURE (ENSURE) Take 237 mLs by mouth 3 (three) times daily between meals.    Marland Kitchen ipratropium-albuterol (  DUONEB) 0.5-2.5 (3) MG/3ML SOLN Take 3 mLs by nebulization 3 (three) times daily. 360 mL 2  . metoprolol tartrate (LOPRESSOR) 25 MG tablet Take 1 tablet (25 mg total) by mouth 2 (two) times daily. 60 tablet 1  . nicotine (NICODERM CQ - DOSED IN MG/24 HOURS) 14 mg/24hr patch Place 1 patch (14 mg total) onto the skin daily. 28 patch 0  . oxyCODONE-acetaminophen (PERCOCET/ROXICET) 5-325 MG tablet Take 1 tablet by mouth every 8 (eight) hours as needed for moderate pain. 12 tablet 0  . pantoprazole (PROTONIX) 40 MG tablet Take 1 tablet (40 mg total) by mouth daily.  30 tablet 3  . predniSONE (DELTASONE) 20 MG tablet Take 2 tablets (40 mg total) by mouth daily with breakfast. 5 tablet 0  . ROBITUSSIN 12 HOUR COUGH 30 MG/5ML liquid Take 30 mg by mouth every 4 (four) hours as needed for cough.      No current facility-administered medications for this visit.     SURGICAL HISTORY:  Past Surgical History:  Procedure Laterality Date  . CHOLECYSTECTOMY    . COLONOSCOPY  2015   Results requested from [facility]  . COLONOSCOPY    . ESOPHAGOGASTRODUODENOSCOPY N/A 08/14/2015   Procedure: ESOPHAGOGASTRODUODENOSCOPY (EGD);  Surgeon: Daneil Dolin, MD;  Location: [facility];  Service: Endoscopy;  Laterality: N/A;  215   . ESOPHAGOGASTRODUODENOSCOPY (EGD) WITH PROPOFOL N/A 09/13/2015   Procedure: ESOPHAGOGASTRODUODENOSCOPY (EGD) WITH PROPOFOL;  Surgeon: Milus Banister, MD;  Location: [facility] ENDOSCOPY;  Service: Endoscopy;  Laterality: N/A;  . EUS N/A 03/12/2017   Procedure: UPPER ENDOSCOPIC ULTRASOUND (EUS) RADIAL;  Surgeon: Milus Banister, MD;  Location: [facility] ENDOSCOPY;  Service: Endoscopy;  Laterality: N/A;  . TUMOR REMOVAL  2005   Benign  . UPPER ESOPHAGEAL ENDOSCOPIC ULTRASOUND (EUS)  09/13/2015   Procedure: UPPER ESOPHAGEAL ENDOSCOPIC ULTRASOUND (EUS);  Surgeon: Milus Banister, MD;  Location: [facility];  Service: Endoscopy;;  . VIDEO BRONCHOSCOPY WITH ENDOBRONCHIAL NAVIGATION N/A 12/31/2015   Procedure: VIDEO BRONCHOSCOPY WITH ENDOBRONCHIAL NAVIGATION;  Surgeon: Melrose Nakayama, MD;  Location: [facility];  Service: Thoracic;  Laterality: N/A;  . VIDEO BRONCHOSCOPY WITH ENDOBRONCHIAL ULTRASOUND N/A 11/08/2015   Procedure: VIDEO BRONCHOSCOPY WITH ENDOBRONCHIAL ULTRASOUND;  Surgeon: Ivin Poot, MD;  Location: [facility] OR;  Service: Thoracic;  Laterality: N/A;    REVIEW OF SYSTEMS:  A comprehensive review of systems was negative except for: Respiratory: positive for dyspnea on exertion   PHYSICAL EXAMINATION: General appearance: alert, cooperative and  no distress Head: Normocephalic, without obvious abnormality, atraumatic Neck: no adenopathy, no JVD, supple, symmetrical, trachea midline and thyroid not enlarged, symmetric, no tenderness/mass/nodules Lymph nodes: Cervical, supraclavicular, and axillary nodes normal. Resp: clear to auscultation bilaterally Back: symmetric, no curvature. ROM normal. No CVA tenderness. Cardio: regular rate and rhythm, S1, S2 normal, no murmur, click, rub or gallop GI: soft, non-tender; bowel sounds normal; no masses,  no organomegaly Extremities: extremities normal, atraumatic, no cyanosis or edema  ECOG PERFORMANCE STATUS: 1 - Symptomatic but completely ambulatory  Blood pressure 126/85, pulse (!) 103, temperature 98.1 F (36.7 C), temperature source Oral, resp. rate 18, height 5\' 7"  (1.702 m), weight 141 lb 12.8 oz (64.3 kg), SpO2 95 %.  LABORATORY DATA: Lab Results  Component Value Date   WBC 5.6 06/07/2018   HGB 14.1 06/07/2018   HCT 41.9 06/07/2018   MCV 84.0 06/07/2018   PLT 240 06/07/2018      Chemistry      Component Value Date/Time  NA 143 05/10/2018 1007   NA 144 04/09/2017 0948   K 4.2 05/10/2018 1007   K 4.3 04/09/2017 0948   CL 103 05/10/2018 1007   CO2 29 05/10/2018 1007   CO2 25 04/09/2017 0948   BUN 15 05/10/2018 1007   BUN 16.8 04/09/2017 0948   CREATININE 0.65 05/10/2018 1007   CREATININE 0.8 04/09/2017 0948      Component Value Date/Time   CALCIUM 9.8 05/10/2018 1007   CALCIUM 9.1 04/09/2017 0948   ALKPHOS 207 (H) 05/10/2018 1007   ALKPHOS 216 (H) 04/09/2017 0948   AST 43 (H) 05/10/2018 1007   AST 12 04/09/2017 0948   ALT 76 (H) 05/10/2018 1007   ALT 13 04/09/2017 0948   BILITOT <0.2 (L) 05/10/2018 1007   BILITOT 0.22 04/09/2017 0948       RADIOGRAPHIC STUDIES: No results found.  ASSESSMENT AND PLAN:  This is a very pleasant 68 years old African-American female with a recurrent non-small cell lung cancer initially diagnosed as stage IIIA non-small cell  lung cancer status post concurrent chemoradiation followed by consolidation chemotherapy with carboplatin and paclitaxel for 3 cycles. The patient was followed by observation but restaging imaging studies showed evidence for disease progression. She was started on second line treatment with immunotherapy with Nivolumab 480 mg IV every 4 weeks status post 14 cycles.   The patient continues to tolerate her treatment well with no concerning adverse effects. I recommended for her to proceed with cycle #15 today as scheduled. I will see the patient back for follow-up visit in 4 weeks for evaluation before the next cycle of her treatment. The patient was advised to call immediately if she has any concerning symptoms in the interval. The patient voices understanding of current disease status and treatment options and is in agreement with the current care plan. All questions were answered. The patient knows to call the clinic with any problems, questions or concerns. We can certainly see the patient much sooner if necessary.  Disclaimer: This note was dictated with voice recognition software. Similar sounding words can inadvertently be transcribed and may not be corrected upon review.

## 2018-06-16 ENCOUNTER — Inpatient Hospital Stay (HOSPITAL_COMMUNITY)
Admission: EM | Admit: 2018-06-16 | Discharge: 2018-06-18 | DRG: 193 | Disposition: A | Discharge status: Home Health | Payer: Medicare HMO | Attending: Internal Medicine | Admitting: Internal Medicine

## 2018-06-16 ENCOUNTER — Other Ambulatory Visit: Payer: Self-pay

## 2018-06-16 ENCOUNTER — Encounter (HOSPITAL_COMMUNITY): Payer: Self-pay | Admitting: Emergency Medicine

## 2018-06-16 ENCOUNTER — Emergency Department (HOSPITAL_COMMUNITY): Payer: Medicare HMO

## 2018-06-16 DIAGNOSIS — C3491 Malignant neoplasm of unspecified part of right bronchus or lung: Secondary | ICD-10-CM | POA: Diagnosis present

## 2018-06-16 DIAGNOSIS — Z7951 Long term (current) use of inhaled steroids: Secondary | ICD-10-CM

## 2018-06-16 DIAGNOSIS — J101 Influenza due to other identified influenza virus with other respiratory manifestations: Secondary | ICD-10-CM | POA: Diagnosis present

## 2018-06-16 DIAGNOSIS — J9621 Acute and chronic respiratory failure with hypoxia: Secondary | ICD-10-CM | POA: Diagnosis present

## 2018-06-16 DIAGNOSIS — Z9221 Personal history of antineoplastic chemotherapy: Secondary | ICD-10-CM | POA: Diagnosis not present

## 2018-06-16 DIAGNOSIS — F329 Major depressive disorder, single episode, unspecified: Secondary | ICD-10-CM | POA: Diagnosis present

## 2018-06-16 DIAGNOSIS — F17210 Nicotine dependence, cigarettes, uncomplicated: Secondary | ICD-10-CM | POA: Diagnosis present

## 2018-06-16 DIAGNOSIS — Z801 Family history of malignant neoplasm of trachea, bronchus and lung: Secondary | ICD-10-CM

## 2018-06-16 DIAGNOSIS — Z8041 Family history of malignant neoplasm of ovary: Secondary | ICD-10-CM | POA: Diagnosis not present

## 2018-06-16 DIAGNOSIS — R519 Headache, unspecified: Secondary | ICD-10-CM

## 2018-06-16 DIAGNOSIS — F32A Depression, unspecified: Secondary | ICD-10-CM | POA: Diagnosis present

## 2018-06-16 DIAGNOSIS — R51 Headache: Secondary | ICD-10-CM | POA: Diagnosis not present

## 2018-06-16 DIAGNOSIS — J441 Chronic obstructive pulmonary disease with (acute) exacerbation: Secondary | ICD-10-CM | POA: Diagnosis not present

## 2018-06-16 DIAGNOSIS — Z9981 Dependence on supplemental oxygen: Secondary | ICD-10-CM

## 2018-06-16 DIAGNOSIS — Z7952 Long term (current) use of systemic steroids: Secondary | ICD-10-CM | POA: Diagnosis not present

## 2018-06-16 DIAGNOSIS — K219 Gastro-esophageal reflux disease without esophagitis: Secondary | ICD-10-CM | POA: Diagnosis present

## 2018-06-16 DIAGNOSIS — Z6822 Body mass index (BMI) 22.0-22.9, adult: Secondary | ICD-10-CM | POA: Diagnosis not present

## 2018-06-16 DIAGNOSIS — I252 Old myocardial infarction: Secondary | ICD-10-CM

## 2018-06-16 DIAGNOSIS — I2510 Atherosclerotic heart disease of native coronary artery without angina pectoris: Secondary | ICD-10-CM | POA: Diagnosis present

## 2018-06-16 DIAGNOSIS — E43 Unspecified severe protein-calorie malnutrition: Secondary | ICD-10-CM

## 2018-06-16 DIAGNOSIS — J471 Bronchiectasis with (acute) exacerbation: Secondary | ICD-10-CM | POA: Diagnosis present

## 2018-06-16 DIAGNOSIS — Z86011 Personal history of benign neoplasm of the brain: Secondary | ICD-10-CM | POA: Diagnosis not present

## 2018-06-16 DIAGNOSIS — I10 Essential (primary) hypertension: Secondary | ICD-10-CM | POA: Diagnosis present

## 2018-06-16 DIAGNOSIS — Z8042 Family history of malignant neoplasm of prostate: Secondary | ICD-10-CM

## 2018-06-16 DIAGNOSIS — G8929 Other chronic pain: Secondary | ICD-10-CM

## 2018-06-16 DIAGNOSIS — J9622 Acute and chronic respiratory failure with hypercapnia: Secondary | ICD-10-CM | POA: Diagnosis present

## 2018-06-16 LAB — COMPREHENSIVE METABOLIC PANEL
ALT: 14 U/L (ref 0–44)
AST: 23 U/L (ref 15–41)
Albumin: 3.9 g/dL (ref 3.5–5.0)
Alkaline Phosphatase: 193 U/L — ABNORMAL HIGH (ref 38–126)
Anion gap: 10 (ref 5–15)
BILIRUBIN TOTAL: 0.4 mg/dL (ref 0.3–1.2)
BUN: 12 mg/dL (ref 8–23)
CO2: 26 mmol/L (ref 22–32)
Calcium: 8.9 mg/dL (ref 8.9–10.3)
Chloride: 106 mmol/L (ref 98–111)
Creatinine, Ser: 0.56 mg/dL (ref 0.44–1.00)
GFR calc Af Amer: >60 mL/min (ref >60)
GFR calc non Af Amer: >60 mL/min (ref >60)
Glucose, Bld: 109 mg/dL — ABNORMAL HIGH (ref 70–99)
Potassium: 3.5 mmol/L (ref 3.5–5.1)
Sodium: 142 mmol/L (ref 135–145)
Total Protein: 6.8 g/dL (ref 6.5–8.1)

## 2018-06-16 LAB — LACTIC ACID, PLASMA
Lactic Acid, Venous: 1.3 mmol/L (ref 0.5–1.9)
Lactic Acid, Venous: 1 mmol/L (ref 0.5–1.9)

## 2018-06-16 LAB — CBC WITH DIFFERENTIAL/PLATELET
Abs Immature Granulocytes: 0.03 10*3/uL (ref 0.00–0.07)
BASOS ABS: 0 10*3/uL (ref 0.0–0.1)
Basophils Relative: 0 %
Eosinophils Absolute: 0 10*3/uL (ref 0.0–0.5)
Eosinophils Relative: 0 %
HEMATOCRIT: 38.8 % (ref 36.0–46.0)
Hemoglobin: 12.8 g/dL (ref 12.0–15.0)
Immature Granulocytes: 0 %
LYMPHS ABS: 0.5 10*3/uL — AB (ref 0.7–4.0)
Lymphocytes Relative: 6 %
MCH: 27.6 pg (ref 26.0–34.0)
MCHC: 33 g/dL (ref 30.0–36.0)
MCV: 83.8 fL (ref 80.0–100.0)
Monocytes Absolute: 0.3 10*3/uL (ref 0.1–1.0)
Monocytes Relative: 3 %
NRBC: 0 % (ref 0.0–0.2)
Neutro Abs: 6.9 10*3/uL (ref 1.7–7.7)
Neutrophils Relative %: 91 %
Platelets: 204 10*3/uL (ref 150–400)
RBC: 4.63 MIL/uL (ref 3.87–5.11)
RDW: 15.8 % — ABNORMAL HIGH (ref 11.5–15.5)
WBC: 7.7 10*3/uL (ref 4.0–10.5)

## 2018-06-16 LAB — INFLUENZA PANEL BY PCR (TYPE A & B)
Influenza A By PCR: POSITIVE — AB
Influenza B By PCR: NEGATIVE

## 2018-06-16 MED ORDER — CYCLOBENZAPRINE HCL 10 MG PO TABS: 10.0000 mg | ORAL_TABLET | Freq: Two times a day (BID) | ORAL | Status: DC | PRN

## 2018-06-16 MED ORDER — HEPARIN SODIUM (PORCINE) 5000 UNIT/ML IJ SOLN
5000.0000 [IU] | Freq: Three times a day (TID) | INTRAMUSCULAR | Status: DC
  Administered 2018-06-16 – 2018-06-18 (×7): 5000 [IU] via SUBCUTANEOUS
  Filled 2018-06-16 (×8): qty 1

## 2018-06-16 MED ORDER — BUDESONIDE 0.5 MG/2ML IN SUSP
0.5000 mg | Freq: Two times a day (BID) | RESPIRATORY_TRACT | Status: DC
  Administered 2018-06-16 – 2018-06-18 (×5): 0.5 mg via RESPIRATORY_TRACT
  Filled 2018-06-16 (×5): qty 2

## 2018-06-16 MED ORDER — ALBUTEROL SULFATE (2.5 MG/3ML) 0.083% IN NEBU: 2.5000 mg | INHALATION_SOLUTION | RESPIRATORY_TRACT | Status: DC | PRN

## 2018-06-16 MED ORDER — IPRATROPIUM-ALBUTEROL 0.5-2.5 (3) MG/3ML IN SOLN
3.0000 mL | Freq: Once | RESPIRATORY_TRACT | Status: AC
  Administered 2018-06-16: 3 mL via RESPIRATORY_TRACT
  Filled 2018-06-16: qty 3

## 2018-06-16 MED ORDER — OSELTAMIVIR PHOSPHATE 75 MG PO CAPS
75.0000 mg | ORAL_CAPSULE | Freq: Two times a day (BID) | ORAL | Status: DC
  Administered 2018-06-16 – 2018-06-18 (×4): 75 mg via ORAL
  Filled 2018-06-16 (×4): qty 1

## 2018-06-16 MED ORDER — ARFORMOTEROL TARTRATE 15 MCG/2ML IN NEBU
15.0000 ug | INHALATION_SOLUTION | Freq: Two times a day (BID) | RESPIRATORY_TRACT | Status: DC
  Administered 2018-06-16 – 2018-06-18 (×5): 15 ug via RESPIRATORY_TRACT
  Filled 2018-06-16 (×5): qty 2

## 2018-06-16 MED ORDER — OXYCODONE-ACETAMINOPHEN 5-325 MG PO TABS
1.0000 | ORAL_TABLET | Freq: Three times a day (TID) | ORAL | Status: DC | PRN
  Administered 2018-06-17: 1 via ORAL
  Filled 2018-06-16: qty 1

## 2018-06-16 MED ORDER — SODIUM CHLORIDE 0.9% FLUSH
3.0000 mL | Freq: Two times a day (BID) | INTRAVENOUS | Status: DC
  Administered 2018-06-16 – 2018-06-18 (×4): 3 mL via INTRAVENOUS

## 2018-06-16 MED ORDER — ONDANSETRON HCL 4 MG/2ML IJ SOLN: 4.0000 mg | Freq: Four times a day (QID) | INTRAMUSCULAR | Status: DC | PRN

## 2018-06-16 MED ORDER — IPRATROPIUM-ALBUTEROL 0.5-2.5 (3) MG/3ML IN SOLN
3.0000 mL | Freq: Four times a day (QID) | RESPIRATORY_TRACT | Status: DC
  Administered 2018-06-16 (×3): 3 mL via RESPIRATORY_TRACT
  Filled 2018-06-16 (×3): qty 3

## 2018-06-16 MED ORDER — NICOTINE 14 MG/24HR TD PT24
14.0000 mg | MEDICATED_PATCH | Freq: Every day | TRANSDERMAL | Status: DC
  Administered 2018-06-16 – 2018-06-18 (×3): 14 mg via TRANSDERMAL
  Filled 2018-06-16 (×3): qty 1

## 2018-06-16 MED ORDER — DM-GUAIFENESIN ER 30-600 MG PO TB12
1.0000 | ORAL_TABLET | Freq: Two times a day (BID) | ORAL | Status: DC
  Administered 2018-06-16 – 2018-06-18 (×5): 1 via ORAL
  Filled 2018-06-16 (×5): qty 1

## 2018-06-16 MED ORDER — SODIUM CHLORIDE 0.9 % IV BOLUS
1000.0000 mL | Freq: Once | INTRAVENOUS | Status: AC
  Administered 2018-06-16: 1000 mL via INTRAVENOUS

## 2018-06-16 MED ORDER — PANTOPRAZOLE SODIUM 40 MG PO TBEC
40.0000 mg | DELAYED_RELEASE_TABLET | Freq: Every day | ORAL | Status: DC
  Administered 2018-06-16 – 2018-06-18 (×3): 40 mg via ORAL
  Filled 2018-06-16 (×3): qty 1

## 2018-06-16 MED ORDER — IBUPROFEN 400 MG PO TABS
400.0000 mg | ORAL_TABLET | Freq: Once | ORAL | Status: AC
  Administered 2018-06-16: 400 mg via ORAL
  Filled 2018-06-16: qty 1

## 2018-06-16 MED ORDER — ENSURE ENLIVE PO LIQD
237.0000 mL | Freq: Three times a day (TID) | ORAL | Status: DC
  Administered 2018-06-16 – 2018-06-18 (×5): 237 mL via ORAL
  Filled 2018-06-16 (×3): qty 237

## 2018-06-16 MED ORDER — ACETAMINOPHEN 325 MG PO TABS
650.0000 mg | ORAL_TABLET | Freq: Once | ORAL | Status: AC
  Administered 2018-06-16: 650 mg via ORAL

## 2018-06-16 MED ORDER — SODIUM CHLORIDE 0.9 % IV SOLN: 250.0000 mL | INTRAVENOUS | Status: DC | PRN

## 2018-06-16 MED ORDER — DOXYCYCLINE HYCLATE 100 MG PO TABS
100.0000 mg | ORAL_TABLET | Freq: Two times a day (BID) | ORAL | Status: DC
  Administered 2018-06-16 – 2018-06-18 (×5): 100 mg via ORAL
  Filled 2018-06-16 (×5): qty 1

## 2018-06-16 MED ORDER — ENSURE ENLIVE PO LIQD
237.0000 mL | Freq: Two times a day (BID) | ORAL | Status: DC
  Administered 2018-06-16 (×2): 237 mL via ORAL
  Filled 2018-06-16 (×4): qty 237

## 2018-06-16 MED ORDER — SODIUM CHLORIDE 0.9% FLUSH: 3.0000 mL | INTRAVENOUS | Status: DC | PRN

## 2018-06-16 MED ORDER — MAGNESIUM SULFATE 2 GM/50ML IV SOLN
2.0000 g | Freq: Once | INTRAVENOUS | Status: AC
  Administered 2018-06-16: 2 g via INTRAVENOUS
  Filled 2018-06-16: qty 50

## 2018-06-16 MED ORDER — ACETAMINOPHEN 325 MG PO TABS
650.0000 mg | ORAL_TABLET | Freq: Four times a day (QID) | ORAL | Status: DC | PRN
  Administered 2018-06-16: 650 mg via ORAL
  Filled 2018-06-16: qty 2

## 2018-06-16 MED ORDER — IPRATROPIUM-ALBUTEROL 0.5-2.5 (3) MG/3ML IN SOLN
3.0000 mL | Freq: Three times a day (TID) | RESPIRATORY_TRACT | Status: DC
  Administered 2018-06-17 – 2018-06-18 (×4): 3 mL via RESPIRATORY_TRACT
  Filled 2018-06-16 (×5): qty 3

## 2018-06-16 MED ORDER — OSELTAMIVIR PHOSPHATE 75 MG PO CAPS
75.0000 mg | ORAL_CAPSULE | Freq: Once | ORAL | Status: AC
  Administered 2018-06-16: 75 mg via ORAL
  Filled 2018-06-16: qty 1

## 2018-06-16 MED ORDER — METOPROLOL TARTRATE 25 MG PO TABS
25.0000 mg | ORAL_TABLET | Freq: Two times a day (BID) | ORAL | Status: DC
  Administered 2018-06-16 – 2018-06-18 (×5): 25 mg via ORAL
  Filled 2018-06-16 (×5): qty 1

## 2018-06-16 MED ORDER — METHYLPREDNISOLONE SODIUM SUCC 125 MG IJ SOLR
80.0000 mg | Freq: Three times a day (TID) | INTRAMUSCULAR | Status: DC
  Administered 2018-06-16 – 2018-06-18 (×7): 80 mg via INTRAVENOUS
  Filled 2018-06-16 (×8): qty 2

## 2018-06-16 MED ORDER — VITAMIN D 25 MCG (1000 UNIT) PO TABS
5000.0000 [IU] | ORAL_TABLET | Freq: Every day | ORAL | Status: DC
  Administered 2018-06-17 – 2018-06-18 (×2): 5000 [IU] via ORAL
  Filled 2018-06-16 (×2): qty 5

## 2018-06-16 MED ORDER — OSELTAMIVIR PHOSPHATE 75 MG PO CAPS: 75.0000 mg | ORAL_CAPSULE | Freq: Two times a day (BID) | ORAL | Status: DC

## 2018-06-16 MED ORDER — ACETAMINOPHEN 325 MG PO TABS
ORAL_TABLET | ORAL | Status: AC
  Filled 2018-06-16: qty 2

## 2018-06-16 MED ORDER — IPRATROPIUM-ALBUTEROL 0.5-2.5 (3) MG/3ML IN SOLN: 3.0000 mL | Freq: Once | RESPIRATORY_TRACT | Status: DC

## 2018-06-16 NOTE — ED Triage Notes (Signed)
Pt C/O SOB and cough that started yesterday. Pt has been taking OTC medication with no relief. Pt given 125mg  solumedrol  En route. Pt also given 1 duoneb en route. Pt on 2L chronically. Pt is cancer pt receiving chemo.

## 2018-06-16 NOTE — H&P (Signed)
History and Physical    Courtney Zuniga WUJ:[id number] DOB: January 20, 1951 DOA: 06/16/2018  Referring MD/NP/PA: Dr. Roxanne Mins PCP: Barry Dienes, NP  Patient coming from: Home  Chief Complaint: Shortness of breath, increased wheezing, productive cough, general malaise.  HPI: Courtney Zuniga is a 68 y.o. female with a past medical history significant for COPD/asthma, depression, lung cancer, gastroesophageal reflux disease, tobacco abuse, hypertension and chronic respiratory failure requiring 2 L oxygen supplementation; who presented to the emergency department secondary to increased shortness of breath, productive cough and general malaise.  Patient reported symptom has been present for the last 2-3 days and worsening.  She expressed chills and feeling warm (has not checked temperature at home), decreased appetite, general malaise and mildly productive coughing spells.  There has not been any signs of bloody sputum.  Patient denies chest pain, nausea, vomiting, abdominal pain, dysuria, hematuria, melena, hematochezia, hematemesis, focal weakness or any other complaints.  In the ED patient was treated with Solu-Medrol, duo nebs, magnesium and higher level of oxygen supplementation.  Chest x-ray demonstrated no acute cardiopulmonary process, but demonstrated chronic bronchitic changes.  Influenza by PCR positive for influenza A; patient treated with 1 dose of Tamiflu.  TRH consulted to admit patient for further evaluation and management.  Past Medical/Surgical History: Past Medical History:  Diagnosis Date  . Anemia    as a young woman  . Arthritis    osteoartritis  . Asthma   . Brain tumor (benign) ([place]) 2005 [facility]   Benign  . Chronic headaches   . Chronic hip pain   . Chronic pain   . COPD (chronic obstructive pulmonary disease) ([place])   . Coronary artery disease   . Depression   . Depression 05/15/2016  . Encounter for antineoplastic chemotherapy 01/10/2016  . GERD (gastroesophageal reflux  disease)   . Hypertension   . Lung cancer ([place]) dx'd 01/2016   currently on chemo and radiation   . NSTEMI (non-ST elevated myocardial infarction) ([place]) yrs ago  . On home O2    qhs 2 liters at hs and prn  . Pneumonia last time 2 yrs ago  . Shortness of breath dyspnea    with activity    Past Surgical History:  Procedure Laterality Date  . CHOLECYSTECTOMY    . COLONOSCOPY  2015   Results requested from [facility]  . COLONOSCOPY    . ESOPHAGOGASTRODUODENOSCOPY N/A 08/14/2015   Procedure: ESOPHAGOGASTRODUODENOSCOPY (EGD);  Surgeon: Daneil Dolin, MD;  [facility];  Service: Endoscopy;  Laterality: N/A;  215   . ESOPHAGOGASTRODUODENOSCOPY (EGD) WITH PROPOFOL N/A 09/13/2015   Procedure: ESOPHAGOGASTRODUODENOSCOPY (EGD) WITH PROPOFOL;  Surgeon: Milus Banister, MD;  Location: [facility];  Service: Endoscopy;  Laterality: N/A;  . EUS N/A 03/12/2017   Procedure: UPPER ENDOSCOPIC ULTRASOUND (EUS) RADIAL;  Surgeon: Milus Banister, MD;  Location: [facility];  Service: Endoscopy;  Laterality: N/A;  . TUMOR REMOVAL  2005   Benign  . UPPER ESOPHAGEAL ENDOSCOPIC ULTRASOUND (EUS)  09/13/2015   Procedure: UPPER ESOPHAGEAL ENDOSCOPIC ULTRASOUND (EUS);  Surgeon: Milus Banister, MD;  Location: [facility];  Service: Endoscopy;;  . VIDEO BRONCHOSCOPY WITH ENDOBRONCHIAL NAVIGATION N/A 12/31/2015   Procedure: VIDEO BRONCHOSCOPY WITH ENDOBRONCHIAL NAVIGATION;  Surgeon: Melrose Nakayama, MD;  Location: [place];  Service: Thoracic;  Laterality: N/A;  . VIDEO BRONCHOSCOPY WITH ENDOBRONCHIAL ULTRASOUND N/A 11/08/2015   Procedure: VIDEO BRONCHOSCOPY WITH ENDOBRONCHIAL ULTRASOUND;  Surgeon: Ivin Poot, MD;  Location: [place];  Service: Thoracic;  Laterality:  N/A;    Social History:  reports that she has been smoking cigarettes. She has a 38.00 pack-year smoking history. She has never used smokeless tobacco. She reports that she does not drink alcohol or use  drugs.  Allergies: No Known Allergies  Family History:  Family History  Problem Relation Age of Onset  . Ovarian cancer Mother   . Lung cancer Father   . Lung cancer Brother   . Lung cancer Brother   . Prostate cancer Brother   . Brain cancer Sister        32 sister    Prior to Admission medications   Medication Sig Start Date End Date Taking? Authorizing Provider  acetaminophen (TYLENOL) 325 MG tablet Take 2 tablets (650 mg total) by mouth every 6 (six) hours as needed for mild pain, fever or headache. 05/08/18  Yes Emokpae, Courage, MD  albuterol (PROVENTIL HFA;VENTOLIN HFA) 108 (90 BASE) MCG/ACT inhaler Inhale 2 puffs into the lungs every 4 (four) hours as needed for shortness of breath.    Yes [provider]  BREO ELLIPTA 100-25 MCG/INH AEPB Inhale 1 puff into the lungs daily.  04/30/17  Yes [provider]  Cholecalciferol (VITAMIN D3) 125 MCG (5000 UT) CAPS Take 1 capsule by mouth daily. 02/17/18  Yes [provider]  cyclobenzaprine (FLEXERIL) 10 MG tablet Take 1 tablet (10 mg total) by mouth 2 (two) times daily. *MAy take one additional tablet as needed for muscle spasms 05/08/18  Yes Emokpae, Courage, MD  diclofenac sodium (VOLTAREN) 1 % GEL Apply topically 4 (four) times daily as needed (massge gel into affected area(s) as needed for pain).  08/11/17  Yes [provider]  ENSURE (ENSURE) Take 237 mLs by mouth 3 (three) times daily between meals.   Yes [provider]  ipratropium-albuterol (DUONEB) 0.5-2.5 (3) MG/3ML SOLN Take 3 mLs by nebulization 3 (three) times daily. 05/08/18  Yes Emokpae, Courage, MD  metoprolol tartrate (LOPRESSOR) 25 MG tablet Take 1 tablet (25 mg total) by mouth 2 (two) times daily. 05/08/18  Yes Emokpae, Courage, MD  nicotine (NICODERM CQ - DOSED IN MG/24 HOURS) 14 mg/24hr patch Place 1 patch (14 mg total) onto the skin daily. 05/09/18  Yes Emokpae, Courage, MD  oxyCODONE-acetaminophen (PERCOCET/ROXICET) 5-325 MG  tablet Take 1 tablet by mouth every 8 (eight) hours as needed for moderate pain. 05/08/18  Yes Emokpae, Courage, MD  pantoprazole (PROTONIX) 40 MG tablet Take 1 tablet (40 mg total) by mouth daily. 05/08/18  Yes Roxan Hockey, MD  predniSONE (DELTASONE) 20 MG tablet Take 2 tablets (40 mg total) by mouth daily with breakfast. 05/09/18  Yes Emokpae, Courage, MD  ROBITUSSIN 12 HOUR COUGH 30 MG/5ML liquid Take 30 mg by mouth every 4 (four) hours as needed for cough.  01/25/18  Yes [provider]    Review of Systems:  Negative except as otherwise mentioned in HPI.   Physical Exam: Vitals:   06/16/18 1134 06/16/18 1334 06/16/18 1421 06/16/18 1424  BP:  111/81    Pulse:  90    Resp:  18    Temp:  98.4 F (36.9 C)    TempSrc:  Oral    SpO2:  100% 97% 95%  Weight: 66.1 kg     Height: 5\' 7"  (1.702 m)      Constitutional: NAD, denying chest pain, no nausea, no vomiting.  Patient is frail, chronically ill and underweight in appearance.  After nebulizer treatments, steroids and magnesium patient was calmer; but is  still having difficulty speaking in full sentences and requiring 4.5 L oxygen supplementation. Eyes: PERRL, lids and conjunctivae normal; no icterus. ENMT: Mucous membranes are moist. Posterior pharynx clear of any exudate or lesions. Neck: normal, supple, no masses, no thyromegaly, no JVD Respiratory: No using accessory muscles.  Positive tachypnea; diffuse expiratory wheezing and positive rhonchi. Cardiovascular: Sinus tachycardia, no murmurs, no rubs, no gallops.   Abdomen: no tenderness, no masses palpated. No hepatosplenomegaly. Bowel sounds positive.  Musculoskeletal: no clubbing / cyanosis. No joint deformity upper and lower extremities. Good ROM, no contractures. Normal muscle tone.  Skin: no rashes, lesions, ulcers. No induration Neurologic: CN 2-12 grossly intact. Sensation intact, DTR normal. Strength 5/5 in all 4.  Psychiatric: Normal judgment and insight. Alert  and oriented x 3. Normal mood.    Labs on Admission: I have personally reviewed the following labs and imaging studies  CBC: Recent Labs  Lab 06/16/18 0613  WBC 7.7  NEUTROABS 6.9  HGB 12.8  HCT 38.8  MCV 83.8  PLT 101   Basic Metabolic Panel: Recent Labs  Lab 06/16/18 0613  NA 142  K 3.5  CL 106  CO2 26  GLUCOSE 109*  BUN 12  CREATININE 0.56  CALCIUM 8.9   GFR: Estimated Creatinine Clearance: 64.5 mL/min (by C-G formula based on SCr of 0.56 mg/dL).   Liver Function Tests: Recent Labs  Lab 06/16/18 0613  AST 23  ALT 14  ALKPHOS 193*  BILITOT 0.4  PROT 6.8  ALBUMIN 3.9   Urine analysis:    Component Value Date/Time   COLORURINE YELLOW 05/05/2018 [place] 05/05/2018 1539   LABSPEC 1.006 05/05/2018 1539   PHURINE 6.0 05/05/2018 1539   GLUCOSEU NEGATIVE 05/05/2018 1539   HGBUR NEGATIVE 05/05/2018 1539   [place] NEGATIVE 05/05/2018 1539   [place] 05/05/2018 1539   PROTEINUR NEGATIVE 05/05/2018 1539   UROBILINOGEN 0.2 03/31/2014 1406   NITRITE NEGATIVE 05/05/2018 1539   LEUKOCYTESUR NEGATIVE 05/05/2018 1539    Recent Results (from the past 240 hour(s))  Culture, blood (routine x 2)     Status: None (Preliminary result)   Collection Time: 06/16/18  6:13 AM  Result Value Ref Range Status   Specimen Description BLOOD LEFT ARM  Final   Special Requests   Final    BOTTLES DRAWN AEROBIC AND ANAEROBIC Blood Culture adequate volume Performed at [facility], [address], [place], [place] [address]    Culture PENDING  Incomplete   Report Status PENDING  Incomplete  Culture, blood (routine x 2)     Status: None (Preliminary result)   Collection Time: 06/16/18  6:21 AM  Result Value Ref Range Status   Specimen Description BLOOD LEFT HAND  Final   Special Requests   Final    BOTTLES DRAWN AEROBIC AND ANAEROBIC Blood Culture adequate volume Performed at [facility], [address] [place], [place] [address]    Culture  PENDING  Incomplete   Report Status PENDING  Incomplete     Radiological Exams on Admission: Dg Chest Port 1 View  Result Date: 06/16/2018 CLINICAL DATA:  SHORTNESS OF BREATH EXAM: PORTABLE CHEST 1 VIEW COMPARISON:  05/05/2018 FINDINGS: Cardiac shadow is stable. Lungs are hyperinflated consistent with COPD. Stable scarring/collapse of the right middle lobe is noted. Nipple shadows are noted bilaterally. No other focal abnormality is seen. IMPRESSION: Stable appearance of the chest when compared with the prior exam consistent with some right middle lobe scarring. COPD. Electronically Signed   By:  Inez Catalina M.D.   On: 06/16/2018 07:04    EKG: Independently reviewed.  No acute ischemic changes; normal QT.  Sinus tachycardia and left axis deviation appreciated.  Assessment/Plan 1-Acute on chronic respiratory failure with hypoxia ([place]): In the setting of influenza A infection, bronchiectasis and COPD exacerbation. -Will place in the hospital MedSurg bed -Start patient on a steroids, duo nebs, Solu-Medrol, Pulmicort, Brovana, Mucinex, flutter valve and doxycycline -Patient started on Tamiflu 75 mg twice a day for 5 days. -Continue increase oxygen supplementation and wean as tolerated. -Supportive care to be provided. -Follow clinical response.  2-HTN (hypertension) -Resume home antihypertensive regimen -Heart healthy diet ordered.  3-COPD with acute exacerbation ([place]) -In the setting of influenza infection and bronchiectasis -As mentioned above will treat with steroids, higher oxygen supplementation with intention to wean down to baseline (2 L 24/7) as tolerated.  Will use Pulmicort and Brovana nebulizer treatment along with duonebs. -Continue doxycycline, flutter valve, Mucinex and treatment for influenza with Tamiflu.  4-Adenocarcinoma of right lung, stage 3 (H[facility]) -Continue outpatient follow-up with oncology service.  5-Depression -Stable -No suicidal ideation or  hallucinations -Patient was no using any acute antidepressant regimen -Continue to monitor  6-Influenza A -Will treat with Tamiflu 75 mg twice a day for 5 days -Continue supportive care -Patient advised to keep yourself well-hydrated -Will use Tylenol/ibuprofen as needed for symptom management.  7-Severe protein-calorie malnutrition ([place]) -Continue feeding supplements in between meals -Patient advised to keep herself well-hydrated and to maintain good nutrition.  8-GERD (gastroesophageal reflux disease)  -continue PPI  9-tobacco abuse -Cessation counseling has been provided -Nicotine patch ordered.  DVT prophylaxis: Heparin Code Status: Full code Family Communication: Sister at bedside Disposition Plan: Anticipate discharge home once breathing stabilized and COPD exacerbation control. Consults called: None Admission status: Inpatient, MedSurg, length of stay more than 2 midnights.   Time Spent: 65 minutes  Barton Dubois MD [facility] Pager [phone]  06/16/2018, 5:03 PM

## 2018-06-16 NOTE — ED Provider Notes (Signed)
[facility] EMERGENCY DEPARTMENT Provider Note   CSN: [id number] Arrival date & time: 06/16/18  2536    History   Chief Complaint Chief Complaint  Patient presents with  . Shortness of Breath    HPI Courtney Zuniga is a 68 y.o. female.    The history is provided by the patient.  Shortness of Breath  She has history of COPD, lung cancer, coronary artery disease and comes in with worsening cough and shortness of breath.  She started getting a cold about 2 days ago.  She has had a nonproductive cough.  She states that she had a documented fever but does not know how high it was.  She has had chills but no sweats.  She denies any chest pain but states she is sore from coughing.  There has been no vomiting or diarrhea.  Dyspnea became worse at about 4 PM.  She is on chronic nasal oxygen but stated it felt like it was not working.  She was also using her home nebulizer without relief.  EMS noted oxygen saturation of 88% while on oxygen and increased her oxygen.  She also received nebulizer treatment as well as intravenous methylprednisolone.  Past Medical History:  Diagnosis Date  . Anemia    as a young woman  . Arthritis    osteoartritis  . Asthma   . Brain tumor (benign) ([place]) 2005 [facility]   Benign  . Chronic headaches   . Chronic hip pain   . Chronic pain   . COPD (chronic obstructive pulmonary disease) ([place])   . Coronary artery disease   . Depression   . Depression 05/15/2016  . Encounter for antineoplastic chemotherapy 01/10/2016  . GERD (gastroesophageal reflux disease)   . Hypertension   . Lung cancer ([place]) dx'd 01/2016   currently on chemo and radiation   . NSTEMI (non-ST elevated myocardial infarction) ([place]) yrs ago  . On home O2    qhs 2 liters at hs and prn  . Pneumonia last time 2 yrs ago  . Shortness of breath dyspnea    with activity    Patient Active Problem List   Diagnosis Date Noted  . PNA (pneumonia) 05/05/2018  . Sepsis ([place]) 05/05/2018  . Chronic  respiratory failure ([place]) 05/05/2018  . Encounter for antineoplastic immunotherapy 04/23/2017  . Depression 05/15/2016  . Adenocarcinoma of right lung, stage 3 ([place]) 01/10/2016  . Encounter for antineoplastic chemotherapy 01/10/2016  . Lung mass 01/03/2016  . Arthritis   . Subepithelial gastric mass   . Mucosal abnormality of stomach   . Abdominal pain 08/08/2015  . Abnormal CT of the abdomen 08/08/2015  . Influenza with pneumonia 06/27/2014  . Hypoxia 06/26/2014  . COPD exacerbation ([place]) 06/26/2014  . CAP (community acquired pneumonia)   . Chest pain, rule out acute myocardial infarction   . Asthma, chronic   . Chest pain 02/16/2014  . HTN (hypertension) 02/16/2014  . DDD (degenerative disc disease), lumbar 02/04/2011  . Lumbar herniated disc 01/09/2011    Past Surgical History:  Procedure Laterality Date  . CHOLECYSTECTOMY    . COLONOSCOPY  2015   Results requested from [facility]  . COLONOSCOPY    . ESOPHAGOGASTRODUODENOSCOPY N/A 08/14/2015   Procedure: ESOPHAGOGASTRODUODENOSCOPY (EGD);  Surgeon: Daneil Dolin, MD;  Location: [facility];  Service: Endoscopy;  Laterality: N/A;  215   . ESOPHAGOGASTRODUODENOSCOPY (EGD) WITH PROPOFOL N/A 09/13/2015   Procedure: ESOPHAGOGASTRODUODENOSCOPY (EGD) WITH PROPOFOL;  Surgeon: Milus Banister, MD;  Location:  [facility] ENDOSCOPY;  Service: Endoscopy;  Laterality: N/A;  . EUS N/A 03/12/2017   Procedure: UPPER ENDOSCOPIC ULTRASOUND (EUS) RADIAL;  Surgeon: Milus Banister, MD;  Location: [facility] ENDOSCOPY;  Service: Endoscopy;  Laterality: N/A;  . TUMOR REMOVAL  2005   Benign  . UPPER ESOPHAGEAL ENDOSCOPIC ULTRASOUND (EUS)  09/13/2015   Procedure: UPPER ESOPHAGEAL ENDOSCOPIC ULTRASOUND (EUS);  Surgeon: Milus Banister, MD;  Location: [facility];  Service: Endoscopy;;  . VIDEO BRONCHOSCOPY WITH ENDOBRONCHIAL NAVIGATION N/A 12/31/2015   Procedure: VIDEO BRONCHOSCOPY WITH ENDOBRONCHIAL NAVIGATION;  Surgeon: Melrose Nakayama, MD;   Location: [place];  Service: Thoracic;  Laterality: N/A;  . VIDEO BRONCHOSCOPY WITH ENDOBRONCHIAL ULTRASOUND N/A 11/08/2015   Procedure: VIDEO BRONCHOSCOPY WITH ENDOBRONCHIAL ULTRASOUND;  Surgeon: Ivin Poot, MD;  Location: [facility] OR;  Service: Thoracic;  Laterality: N/A;     OB History   No obstetric history on file.      Home Medications    Prior to Admission medications   Medication Sig Start Date End Date Taking? Authorizing Provider  acetaminophen (TYLENOL) 325 MG tablet Take 2 tablets (650 mg total) by mouth every 6 (six) hours as needed for mild pain, fever or headache. 05/08/18   Denton Brick, Courage, MD  albuterol (PROVENTIL HFA;VENTOLIN HFA) 108 (90 BASE) MCG/ACT inhaler Inhale 2 puffs into the lungs every 4 (four) hours as needed for shortness of breath.     [provider]  BREO ELLIPTA 100-25 MCG/INH AEPB Inhale 1 puff into the lungs daily.  04/30/17   [provider]  Cholecalciferol (VITAMIN D3) 125 MCG (5000 UT) CAPS Take 1 capsule by mouth daily. 02/17/18   [provider]  cyclobenzaprine (FLEXERIL) 10 MG tablet Take 1 tablet (10 mg total) by mouth 2 (two) times daily. *MAy take one additional tablet as needed for muscle spasms 05/08/18   Roxan Hockey, MD  diclofenac sodium (VOLTAREN) 1 % GEL Apply topically 4 (four) times daily as needed (massge gel into affected area(s) as needed for pain).  08/11/17   [provider]  ENSURE (ENSURE) Take 237 mLs by mouth 3 (three) times daily between meals.    [provider]  ipratropium-albuterol (DUONEB) 0.5-2.5 (3) MG/3ML SOLN Take 3 mLs by nebulization 3 (three) times daily. 05/08/18   Roxan Hockey, MD  metoprolol tartrate (LOPRESSOR) 25 MG tablet Take 1 tablet (25 mg total) by mouth 2 (two) times daily. 05/08/18   Roxan Hockey, MD  nicotine (NICODERM CQ - DOSED IN MG/24 HOURS) 14 mg/24hr patch Place 1 patch (14 mg total) onto the skin daily. 05/09/18   Roxan Hockey, MD    oxyCODONE-acetaminophen (PERCOCET/ROXICET) 5-325 MG tablet Take 1 tablet by mouth every 8 (eight) hours as needed for moderate pain. 05/08/18   Roxan Hockey, MD  pantoprazole (PROTONIX) 40 MG tablet Take 1 tablet (40 mg total) by mouth daily. 05/08/18   Roxan Hockey, MD  predniSONE (DELTASONE) 20 MG tablet Take 2 tablets (40 mg total) by mouth daily with breakfast. 05/09/18   Emokpae, Courage, MD  ROBITUSSIN 12 HOUR COUGH 30 MG/5ML liquid Take 30 mg by mouth every 4 (four) hours as needed for cough.  01/25/18   [provider]    Family History Family History  Problem Relation Age of Onset  . Ovarian cancer Mother   . Lung cancer Father   . Lung cancer Brother   . Lung cancer Brother   . Prostate cancer Brother   . Brain cancer Sister  Half sister    Social History Social History   Tobacco Use  . Smoking status: Current Some Day Smoker    Packs/day: 1.00    Years: 38.00    Pack years: 38.00    Types: Cigarettes    Last attempt to quit: 01/15/2016    Years since quitting: 2.4  . Smokeless tobacco: Never Used  . Tobacco comment: Pt has asked doctor for med to help   Substance Use Topics  . Alcohol use: No    Alcohol/week: 0.0 standard drinks  . Drug use: No     Allergies   Patient has no known allergies.   Review of Systems Review of Systems  Respiratory: Positive for shortness of breath.   All other systems reviewed and are negative.    Physical Exam Updated Vital Signs BP 127/85 (BP Location: Left Arm)   Pulse (!) 125   Temp 99.8 F (37.7 C) (Oral)   Resp (!) 35   SpO2 (!) 88%   Physical Exam Vitals signs and nursing note reviewed.    68 year old female, in mild respiratory distress.  She has slight use of accessory muscles of respiration, but can speak in complete sentences without stopping for breath. Vital signs are significant for rapid heart rate, rapid respiratory rate. Oxygen saturation is 88%, which is hypoxic. Head is  normocephalic and atraumatic. PERRLA, EOMI. Oropharynx is clear. Neck is nontender and supple without adenopathy or JVD. Back is nontender and there is no CVA tenderness. Lungs have diminished breath sounds throughout with faint expiratory wheezes.  No rales or rhonchi are appreciated. Chest is nontender. Heart has regular rate and rhythm without murmur. Abdomen is soft, flat, nontender without masses or hepatosplenomegaly and peristalsis is normoactive. Extremities have no cyanosis or edema, full range of motion is present. Skin is warm and dry without rash. Neurologic: Mental status is normal, cranial nerves are intact, there are no motor or sensory deficits.  ED Treatments / Results  Labs (all labs ordered are listed, but only abnormal results are displayed) Labs Reviewed  COMPREHENSIVE METABOLIC PANEL - Abnormal; Notable for the following components:      Result Value   Glucose, Bld 109 (*)    Alkaline Phosphatase 193 (*)    All other components within normal limits  CBC WITH DIFFERENTIAL/PLATELET - Abnormal; Notable for the following components:   RDW 15.8 (*)    Lymphs Abs 0.5 (*)    All other components within normal limits  INFLUENZA PANEL BY PCR (TYPE A & B) - Abnormal; Notable for the following components:   Influenza A By PCR POSITIVE (*)    All other components within normal limits  CULTURE, BLOOD (ROUTINE X 2)  CULTURE, BLOOD (ROUTINE X 2)  LACTIC ACID, PLASMA  LACTIC ACID, PLASMA    EKG EKG Interpretation  Date/Time:  Wednesday June 16 2018 05:33:39 EST Ventricular Rate:  119 PR Interval:    QRS Duration: 78 QT Interval:  307 QTC Calculation: 432 R Axis:   -61 Text Interpretation:  Sinus tachycardia Probable left atrial enlargement Left axis deviation Low voltage, extremity and precordial leads Consider anterior infarct , age undetermined coomp 05/05/2018, No significant change was found Confirmed by Delora Fuel ([id number]) on 06/16/2018 5:40:10  AM   Radiology Dg Chest Port 1 View  Result Date: 06/16/2018 CLINICAL DATA:  SHORTNESS OF BREATH EXAM: PORTABLE CHEST 1 VIEW COMPARISON:  05/05/2018 FINDINGS: Cardiac shadow is stable. Lungs are hyperinflated consistent with COPD. Stable scarring/collapse of the  right middle lobe is noted. Nipple shadows are noted bilaterally. No other focal abnormality is seen. IMPRESSION: Stable appearance of the chest when compared with the prior exam consistent with some right middle lobe scarring. COPD. Electronically Signed   By: Inez Catalina M.D.   On: 06/16/2018 07:04    Procedures Procedures  CRITICAL CARE Performed by: Delora Fuel Total critical care time: 45 minutes Critical care time was exclusive of separately billable procedures and treating other patients. Critical care was necessary to treat or prevent imminent or life-threatening deterioration. Critical care was time spent personally by me on the following activities: development of treatment plan with patient and/or surrogate as well as nursing, discussions with consultants, evaluation of patient's response to treatment, examination of patient, obtaining history from patient or surrogate, ordering and performing treatments and interventions, ordering and review of laboratory studies, ordering and review of radiographic studies, pulse oximetry and re-evaluation of patient's condition.  Medications Ordered in ED Medications  acetaminophen (TYLENOL) 325 MG tablet (  Not Given 06/16/18 0708)  oseltamivir (TAMIFLU) capsule 75 mg (has no administration in time range)  ipratropium-albuterol (DUONEB) 0.5-2.5 (3) MG/3ML nebulizer solution 3 mL (has no administration in time range)  ibuprofen (ADVIL,MOTRIN) tablet 400 mg (has no administration in time range)  heparin injection 5,000 Units (has no administration in time range)  sodium chloride 0.9 % bolus 1,000 mL (1,000 mLs Intravenous New Bag/Given 06/16/18 0620)  ipratropium-albuterol (DUONEB) 0.5-2.5  (3) MG/3ML nebulizer solution 3 mL (3 mLs Nebulization Given 06/16/18 0602)  magnesium sulfate IVPB 2 g 50 mL (0 g Intravenous Stopped 06/16/18 0727)  acetaminophen (TYLENOL) tablet 650 mg (650 mg Oral Given 06/16/18 0615)     Initial Impression / Assessment and Plan / ED Course  I have reviewed the triage vital signs and the nursing notes.  Pertinent labs & imaging results that were available during my care of the patient were reviewed by me and considered in my medical decision making (see chart for details).  Cough and dyspnea -possible pneumonia, possible influenza or other respiratory infection.  Old records are reviewed confirming outpatient management for non-small cell lung cancer.  Also, hospitalization 6 weeks ago for community-acquired pneumonia.  Will check chest x-ray.  She is given intravenous magnesium and is given a nebulizer treatment with albuterol and ipratropium.  Also, blood cultures and lactic acid level will be obtained.  ECG is unchanged from prior.  Lactic acid level is normal.  WBC is normal but with a left shift.  Metabolic panel significant only for elevated alkaline phosphatase which is actually declining.  Following nebulizer treatment she continues to be dyspneic although she is maintaining adequate oxygen saturations on 5 L of oxygen via nasal cannula.  Chest x-ray shows right middle lobe scarring but no new infiltrate.  Flu screen is positive for influenza A.  She is started on oseltamivir.  She continues to have significant wheezing in spite of multiple nebulizer treatments.  She is also running a persistent tachycardia and is not felt to be safe for discharge.  Case is discussed with Dr. Dyann Kief of [facility], who agrees to admit the patient.  Final Clinical Impressions(s) / ED Diagnoses   Final diagnoses:  COPD exacerbation [facility])  Influenza A    ED Discharge Orders    None       Delora Fuel, MD 89/37/34 [id number]

## 2018-06-17 LAB — CBC
HCT: 34.9 % — ABNORMAL LOW (ref 36.0–46.0)
Hemoglobin: 11.5 g/dL — ABNORMAL LOW (ref 12.0–15.0)
MCH: 27.8 pg (ref 26.0–34.0)
MCHC: 33 g/dL (ref 30.0–36.0)
MCV: 84.5 fL (ref 80.0–100.0)
PLATELETS: 188 10*3/uL (ref 150–400)
RBC: 4.13 MIL/uL (ref 3.87–5.11)
RDW: 15.9 % — ABNORMAL HIGH (ref 11.5–15.5)
WBC: 4.4 10*3/uL (ref 4.0–10.5)
nRBC: 0 % (ref 0.0–0.2)

## 2018-06-17 LAB — MAGNESIUM: Magnesium: 2 mg/dL (ref 1.7–2.4)

## 2018-06-17 LAB — BASIC METABOLIC PANEL
Anion gap: 5 (ref 5–15)
BUN: 14 mg/dL (ref 8–23)
CALCIUM: 8.7 mg/dL — AB (ref 8.9–10.3)
CO2: 28 mmol/L (ref 22–32)
Chloride: 107 mmol/L (ref 98–111)
Creatinine, Ser: 0.46 mg/dL (ref 0.44–1.00)
GFR calc Af Amer: >60 mL/min (ref >60)
GFR calc non Af Amer: >60 mL/min (ref >60)
Glucose, Bld: 120 mg/dL — ABNORMAL HIGH (ref 70–99)
Potassium: 4.7 mmol/L (ref 3.5–5.1)
Sodium: 140 mmol/L (ref 135–145)

## 2018-06-17 LAB — HIV ANTIBODY (ROUTINE TESTING W REFLEX): HIV Screen 4th Generation wRfx: NONREACTIVE

## 2018-06-17 LAB — PHOSPHORUS: PHOSPHORUS: 3.5 mg/dL (ref 2.5–4.6)

## 2018-06-17 MED ORDER — ALUM & MAG HYDROXIDE-SIMETH 200-200-20 MG/5ML PO SUSP
30.0000 mL | Freq: Four times a day (QID) | ORAL | Status: DC | PRN
  Administered 2018-06-17 (×2): 30 mL via ORAL
  Filled 2018-06-17 (×2): qty 30

## 2018-06-17 MED ORDER — OCUVITE-LUTEIN PO CAPS
1.0000 | ORAL_CAPSULE | Freq: Every day | ORAL | Status: DC
  Administered 2018-06-17 – 2018-06-18 (×2): 1 via ORAL
  Filled 2018-06-17 (×3): qty 1

## 2018-06-17 NOTE — Progress Notes (Signed)
PROGRESS NOTE    Tracie Dore  PNT:[id number] DOB: 09/22/1950 DOA: 06/16/2018 PCP: Barry Dienes, NP     Brief Narrative:  68 year old female admitted with acute on chronic respiratory failure in the setting of influenza A infection, bronchiectasis and COPD exacerbation.    Patient chronically uses 2 L of oxygen at baseline.  Assessment & Plan: 1-acute on chronic respiratory failure with hypoxia -Due to influenza A infection, bronchiectasis and COPD exacerbation. -Continue steroids, duo nebs, Pulmicort, Brovana, Mucinex and flutter valve. -Will also continue doxycycline and continue treatment with Tamiflu for 4 more days. -Patient is still requiring 3-4 L nasal cannula supplementation and demonstrating difficulty speaking in full sentences and shortness of breath with minimal exertion. -Continue supportive care and follow clinical response.  2-hypertension -Continue current antihypertensive regimen -Heart healthy diet has been ordered.  3-COPD with acute exacerbation -As mentioned above continue treatment with the steroids, nebulizer treatments, oxygen supplementation, antibiotics, mucolytic's and flutter valve.  4-influenza A infection -Continue treatment with Tamiflu.  5-depression -Stable -No suicidal ideation or hallucination -Continue to follow her mood -Patient was no using any antidepressant regimen prior to admission.  6-adenocarcinoma of the right lung, stage III -Continue outpatient follow-up with oncology service.  7-severe protein calorie malnutrition -Continue feeding supplements  8-GERD -Continue PPI  9-tobacco abuse -Cessation counseling has been provided -Continue nicotine patch.   DVT prophylaxis: Heparin Code Status: Full code Family Communication: No family at bedside Disposition Plan: Remains inpatient, continue steroids, continue nebulizer treatment and follow clinical response.  Continue Tamiflu.  Patient is slowly improving.  Consultants:    None  Procedures:   See below for x-ray reports  Antimicrobials:  Anti-infectives (From admission, onward)   Start     Dose/Rate Route Frequency Ordered Stop   06/16/18 2200  oseltamivir (TAMIFLU) capsule 75 mg     75 mg Oral 2 times daily 06/16/18 1931 06/21/18 2159   06/16/18 1930  oseltamivir (TAMIFLU) capsule 75 mg  Status:  Discontinued     75 mg Oral 2 times daily 06/16/18 0810 06/16/18 1931   06/16/18 1000  doxycycline (VIBRA-TABS) tablet 100 mg     100 mg Oral Every 12 hours 06/16/18 0810     06/16/18 0730  oseltamivir (TAMIFLU) capsule 75 mg     75 mg Oral  Once 06/16/18 0728 06/16/18 0935       Subjective: Afebrile, breathing easier; but still with shortness of breath on exertion and requiring 4 L nasal cannula supplementation.  No chest pain, no nausea, no vomiting.  Patient reported feeling weak and tired.  Objective: Vitals:   06/16/18 1424 06/16/18 1927 06/16/18 2119 06/17/18 0608  BP:   118/79 107/76  Pulse:   (!) 104 89  Resp:   18 16  Temp:   98.5 F (36.9 C) 98.2 F (36.8 C)  TempSrc:   Oral Oral  SpO2: 95% 96% 100% 100%  Weight:      Height:        Intake/Output Summary (Last 24 hours) at 06/17/2018 0820 Last data filed at 06/16/2018 1500 Gross per 24 hour  Intake 260 ml  Output -  Net 260 ml   Filed Weights   06/16/18 1134  Weight: 66.1 kg    Examination: General exam: Alert, awake, oriented x 3; no fever, still short of breath and feeling weak.  No chest pain Respiratory system: Positive expiratory wheezing, positive rhonchi; no using accessory muscles.  Normal respiratory effort. Cardiovascular system:RRR. No murmurs, rubs, gallops. Gastrointestinal system: Abdomen  is nondistended, soft and nontender. No organomegaly or masses felt. Normal bowel sounds heard. Central nervous system: Alert and oriented. No focal neurological deficits. Extremities: No C/C/E, +pedal pulses Skin: No rashes, lesions or ulcers Psychiatry: Judgement and  insight appear normal. Mood & affect appropriate.   Data Reviewed: I have personally reviewed following labs and imaging studies  CBC: Recent Labs  Lab 06/16/18 0613 06/17/18 0420  WBC 7.7 4.4  NEUTROABS 6.9  --   HGB 12.8 11.5*  HCT 38.8 34.9*  MCV 83.8 84.5  PLT 204 062   Basic Metabolic Panel: Recent Labs  Lab 06/16/18 0613 06/17/18 0420  NA 142 140  K 3.5 4.7  CL 106 107  CO2 26 28  GLUCOSE 109* 120*  BUN 12 14  CREATININE 0.56 0.46  CALCIUM 8.9 8.7*  MG  --  2.0  PHOS  --  3.5   GFR: Estimated Creatinine Clearance: 64.5 mL/min (by C-G formula based on SCr of 0.46 mg/dL).   Liver Function Tests: Recent Labs  Lab 06/16/18 0613  AST 23  ALT 14  ALKPHOS 193*  BILITOT 0.4  PROT 6.8  ALBUMIN 3.9   Urine analysis:    Component Value Date/Time   COLORURINE YELLOW 05/05/2018 [place] 05/05/2018 1539   LABSPEC 1.006 05/05/2018 1539   PHURINE 6.0 05/05/2018 1539   GLUCOSEU NEGATIVE 05/05/2018 1539   HGBUR NEGATIVE 05/05/2018 1539   [place] NEGATIVE 05/05/2018 1539   [place] 05/05/2018 1539   PROTEINUR NEGATIVE 05/05/2018 1539   UROBILINOGEN 0.2 03/31/2014 1406   NITRITE NEGATIVE 05/05/2018 1539   LEUKOCYTESUR NEGATIVE 05/05/2018 1539    Recent Results (from the past 240 hour(s))  Culture, blood (routine x 2)     Status: None (Preliminary result)   Collection Time: 06/16/18  6:13 AM  Result Value Ref Range Status   Specimen Description BLOOD LEFT ARM  Final   Special Requests   Final    BOTTLES DRAWN AEROBIC AND ANAEROBIC Blood Culture adequate volume   Culture   Final    NO GROWTH < 24 HOURS Performed at [facility], [address] [place], [place] [address]    Report Status PENDING  Incomplete  Culture, blood (routine x 2)     Status: None (Preliminary result)   Collection Time: 06/16/18  6:21 AM  Result Value Ref Range Status   Specimen Description BLOOD LEFT HAND  Final   Special Requests   Final    BOTTLES  DRAWN AEROBIC AND ANAEROBIC Blood Culture adequate volume   Culture   Final    NO GROWTH < 24 HOURS Performed at [facility], [address], [place],  [address]    Report Status PENDING  Incomplete     Radiology Studies: Dg Chest Port 1 View  Result Date: 06/16/2018 CLINICAL DATA:  SHORTNESS OF BREATH EXAM: PORTABLE CHEST 1 VIEW COMPARISON:  05/05/2018 FINDINGS: Cardiac shadow is stable. Lungs are hyperinflated consistent with COPD. Stable scarring/collapse of the right middle lobe is noted. Nipple shadows are noted bilaterally. No other focal abnormality is seen. IMPRESSION: Stable appearance of the chest when compared with the prior exam consistent with some right middle lobe scarring. COPD. Electronically Signed   By: Inez Catalina M.D.   On: 06/16/2018 07:04    Scheduled Meds: . arformoterol  15 mcg Nebulization BID  . budesonide (PULMICORT) nebulizer solution  0.5 mg Nebulization BID  . cholecalciferol  5,000 Units Oral Daily  . dextromethorphan-guaiFENesin  1 tablet  Oral BID  . doxycycline  100 mg Oral Q12H  . feeding supplement (ENSURE ENLIVE)  237 mL Oral TID BM  . heparin injection (subcutaneous)  5,000 Units Subcutaneous Q8H  . ipratropium-albuterol  3 mL Nebulization TID  . methylPREDNISolone (SOLU-MEDROL) injection  80 mg Intravenous Q8H  . metoprolol tartrate  25 mg Oral BID  . nicotine  14 mg Transdermal Daily  . oseltamivir  75 mg Oral BID  . pantoprazole  40 mg Oral Daily  . sodium chloride flush  3 mL Intravenous Q12H   Continuous Infusions: . sodium chloride       LOS: 1 day    Time spent: 30 minutes.   Barton Dubois, MD [facility] Pager [phone]  06/17/2018, 8:20 AM

## 2018-06-17 NOTE — Progress Notes (Signed)
Pt is eating. Will do nebulizers later

## 2018-06-17 NOTE — Care Management Note (Signed)
CM consulted for Home Health PT needs. Met with pt today to assess. Per pt, she lives alone and plans to return home at dc. Pt states she has a walker and shower chair. She is also already on home O2. Pt states her daughter will take her home at dc.   Discussed recommendation for [facility] PT with pt. Pt is agreeable. Provided [facility] provider choices to pt in verbal and written format. Pt requests referral to [place].   Pt states she does not think she is being discharged today. Referral made to [facility] with [facility].   Pt does not anticipate any other CM needs for dc.

## 2018-06-17 NOTE — Evaluation (Signed)
Physical Therapy Evaluation Patient Details Name: Courtney Zuniga MRN: [id number] DOB: 1950-06-21 Today's Date: 06/17/2018   History of Present Illness  Courtney Zuniga is a 68 y.o. female with a past medical history significant for COPD/asthma, depression, lung cancer, gastroesophageal reflux disease, tobacco abuse, hypertension and chronic respiratory failure requiring 2 L oxygen supplementation; who presented to the emergency department secondary to increased shortness of breath, productive cough and general malaise.  Patient reported symptom has been present for the last 2-3 days and worsening.  She expressed chills and feeling warm (has not checked temperature at home), decreased appetite, general malaise and mildly productive coughing spells.  There has not been any signs of bloody sputum.     Clinical Impression  Patient functioning near baseline for functional mobility and gait, limited mostly to c/o fatigue and SOB when completing functional tasks.  Patient ambulated in hallway with fair/good tolerance but required frequent standing rest breaks with SpO2 remaining above 92% while on 2 LPM most of time, but required O2 increased to 3 LPM to keep SpO2 above 92% when walking back to room.  Patient tolerated sitting up in chair after therapy.  Patient will benefit from continued physical therapy in hospital and recommended venue below to increase strength, balance, endurance for safe ADLs and gait.     Follow Up Recommendations Home health PT;Supervision - Intermittent    Equipment Recommendations  None recommended by PT    Recommendations for Other Services       Precautions / Restrictions Precautions Precautions: Fall Restrictions Weight Bearing Restrictions: No      Mobility  Bed Mobility Overal bed mobility: Modified Independent             General bed mobility comments: increased time with head of bed raised  Transfers Overall transfer level: Needs  assistance Equipment used: None;Rolling walker (2 wheeled) Transfers: Sit to/from [facility] Sit to Stand: Supervision Stand pivot transfers: Supervision       General transfer comment: labored movement, tends to lean on nearby objects for support, required use of RW for safety  Ambulation/Gait Ambulation/Gait assistance: Min guard;Supervision Gait Distance (Feet): 130 Feet Assistive device: Rolling walker (2 wheeled) Gait Pattern/deviations: Decreased step length - right;Decreased step length - left;Decreased stride length Gait velocity: decreased   General Gait Details: slow labored cadence without loss of balance, frequent standing rest breaks due to SOB/fatigue, on 2 LPM with SpO2 dropping from 93% to 88%, had to increase to 3 LPM to keep SpO2 above 91%  Stairs            Wheelchair Mobility    Modified Rankin (Stroke Patients Only)       Balance Overall balance assessment: Needs assistance Sitting-balance support: Feet supported;Bilateral upper extremity supported Sitting balance-Leahy Scale: Good     Standing balance support: During functional activity;No upper extremity supported Standing balance-Leahy Scale: Fair Standing balance comment: fair/good using RW                             Pertinent Vitals/Pain Pain Assessment: No/denies pain    Home Living Family/patient expects to be discharged to:: Private residence Living Arrangements: Alone Available Help at Discharge: Family;Friend(s);Available 24 hours/day Type of Home: Mobile home Home Access: Stairs to enter Entrance Stairs-Rails: Right;Left;Can reach both Entrance Stairs-Number of Steps: 3 Home Layout: One level Home Equipment: Walker - 2 wheels;Shower seat Additional Comments: On 2 LPM home O2    Prior Function Level  of Independence: Independent         Comments: community ambulator, drives     Hand Dominance   Dominant Hand: Right    Extremity/Trunk  Assessment   Upper Extremity Assessment Upper Extremity Assessment: Generalized weakness    Lower Extremity Assessment Lower Extremity Assessment: Generalized weakness    Cervical / Trunk Assessment Cervical / Trunk Assessment: Normal  Communication   Communication: No difficulties  Cognition Arousal/Alertness: Awake/alert Behavior During Therapy: WFL for tasks assessed/performed Overall Cognitive Status: Within Functional Limits for tasks assessed                                        General Comments      Exercises     Assessment/Plan    PT Assessment Patient needs continued PT services  PT Problem List Decreased strength;Decreased activity tolerance;Decreased balance;Decreased mobility       PT Treatment Interventions Therapeutic exercise;Gait training;Stair training;Patient/family education;Therapeutic activities;Functional mobility training    PT Goals (Current goals can be found in the Care Plan section)  Acute Rehab PT Goals Patient Stated Goal: return home with family to assist PT Goal Formulation: With patient Time For Goal Achievement: 06/21/18 Potential to Achieve Goals: Good    Frequency Min 3X/week   Barriers to discharge        Co-evaluation               AM-PAC PT "6 Clicks" Mobility  Outcome Measure Help needed turning from your back to your side while in a flat bed without using bedrails?: None Help needed moving from lying on your back to sitting on the side of a flat bed without using bedrails?: None Help needed moving to and from a bed to a chair (including a wheelchair)?: A Little Help needed standing up from a chair using your arms (e.g., wheelchair or bedside chair)?: None Help needed to walk in hospital room?: A Little Help needed climbing 3-5 steps with a railing? : A Little 6 Click Score: 21    End of Session Equipment Utilized During Treatment: Oxygen Activity Tolerance: Patient tolerated treatment  well;Patient limited by fatigue Patient left: in chair;with call bell/phone within reach Nurse Communication: Mobility status PT Visit Diagnosis: Unsteadiness on feet (R26.81);Other abnormalities of gait and mobility (R26.89);Muscle weakness (generalized) (M62.81)    Time: 2563-8937 PT Time Calculation (min) (ACUTE ONLY): 25 min   Charges:   PT Evaluation $PT Eval Moderate Complexity: 1 Mod PT Treatments $Gait Training: 23-37 mins        2:03 PM, 06/17/18 Lonell Grandchild, MPT Physical Therapist with [facility] [phone] office [phone] mobile phone

## 2018-06-17 NOTE — Progress Notes (Signed)
Initial Nutrition Assessment  DOCUMENTATION CODES:   Severe malnutrition in context of chronic illness  INTERVENTION:  Recommend liberalize diet to regular given her poor oral intake   Increase Ensure to TID with meals  Provided handout General Healthful [place] diet     NUTRITION DIAGNOSIS:   Severe Malnutrition related to chronic illness, acute illness, cancer and cancer related treatments, poor appetite, early satiety(COPD/asthma- acute shortness of breath at admission, Lung CA-undergoing 3rd round of chemotherapy stage III) as evidenced by per patient/family report, severe muscle depletion, energy intake < or equal to 75% for > or equal to 1 month.   GOAL:   Patient will meet greater than or equal to 90% of their needs   MONITOR:   Supplement acceptance, PO intake, Labs, Weight trends  REASON FOR ASSESSMENT:   Consult Assessment of nutrition requirement/status  ASSESSMENT:  Patient is a 68 yo female with history of COPD (asthma), CAD, NSTEMI, GERD, Lung Cancer (stage III) and HTN. Tobacco -smoker. Presents with shortness of breath, cough and malaise. Acute on chronic respiratory failure and positive Influenza A.   Meal intake-poor (0-25%). At home she lives mainly on Ensures (drinks 3-4 per day). Patient is able to drive short distances and shops for food.  Usually has oatmeal for breakfast, half sandwich and ensure for lunch and dinner varies. Patient affirms good fluid intake and is aware of dehydration risk with poor oral intake. Patient says she has no taste for food and hasn't since starting cancer treatments. Currently she is receiving 3rd round of chemotherapy [facility]. According to intake records and diet recall-patient is not meeting estimated energy needs daily.   Her weight history is stable 62-66 kg range the past 7 months. She has severe muscle depletions clavicles, acromion. No edema BLE.   Medications reviewed and include: Vitamin D3, Ensure Enlive,  Lopressor, Tamiflu and Protonix.   Labs: BMP Latest Ref Rng & Units 06/17/2018 06/16/2018 06/07/2018  Glucose 70 - 99 mg/dL 120(H) 109(H) 72  BUN 8 - 23 mg/dL 14 12 17   Creatinine 0.44 - 1.00 mg/dL 0.46 0.56 0.74  Sodium 135 - 145 mmol/L 140 142 141  Potassium 3.5 - 5.1 mmol/L 4.7 3.5 4.0  Chloride 98 - 111 mmol/L 107 106 103  CO2 22 - 32 mmol/L 28 26 27   Calcium 8.9 - 10.3 mg/dL 8.7(L) 8.9 9.5      Diet Order:   Diet Order            Diet Heart Room service appropriate? Yes; Fluid consistency: Thin  Diet effective now              EDUCATION NEEDS:   Education needs have been addressed(handout provided) Skin:  Skin Assessment: Reviewed RN Assessment  Last BM:  3/4  Height:   Ht Readings from Last 1 Encounters:  06/16/18 5\' 7"  (1.702 m)    Weight:   Wt Readings from Last 1 Encounters:  06/16/18 66.1 kg    Ideal Body Weight:  61 kg  BMI:  Body mass index is 22.82 kg/m.  Estimated Nutritional Needs:   Kcal:  3536-1443 (28-31 kcal/kg/bw)  Protein:  86-92 (1.3-1.4 gr/kg/bw)  Fluid:  >1800 ml daily fluid  Colman Cater MS,RD,CSG,LDN Office: [phone] Pager: [phone]

## 2018-06-17 NOTE — Plan of Care (Signed)
  Problem: Acute Rehab PT Goals(only PT should resolve) Goal: Pt Will Go Supine/Side To Sit Outcome: Progressing Flowsheets (Taken 06/17/2018 1407) Pt will go Supine/Side to Sit: Independently Goal: Patient Will Transfer Sit To/From Stand Outcome: Progressing Flowsheets (Taken 06/17/2018 1407) Patient will transfer sit to/from stand: with modified independence Goal: Pt Will Transfer Bed To Chair/Chair To Bed Outcome: Progressing Flowsheets (Taken 06/17/2018 1407) Pt will Transfer Bed to Chair/Chair to Bed: with modified independence Goal: Pt Will Ambulate Outcome: Progressing Flowsheets (Taken 06/17/2018 1407) Pt will Ambulate: > 125 feet; with modified independence; with rolling walker Note:  Without AD if possible   2:07 PM, 06/17/18 Lonell Grandchild, MPT Physical Therapist with [facility] [phone] office [phone] mobile phone

## 2018-06-18 ENCOUNTER — Telehealth: Payer: Self-pay | Admitting: Internal Medicine

## 2018-06-18 DIAGNOSIS — G8929 Other chronic pain: Secondary | ICD-10-CM

## 2018-06-18 DIAGNOSIS — R51 Headache: Secondary | ICD-10-CM

## 2018-06-18 MED ORDER — PREDNISONE 20 MG PO TABS: ORAL_TABLET | ORAL | 0 refills | Status: DC

## 2018-06-18 MED ORDER — TOPIRAMATE 25 MG PO TABS: ORAL_TABLET | ORAL | 2 refills | Status: DC

## 2018-06-18 MED ORDER — DOXYCYCLINE HYCLATE 100 MG PO TABS: 100.0000 mg | ORAL_TABLET | Freq: Two times a day (BID) | ORAL | 0 refills | Status: AC

## 2018-06-18 MED ORDER — DM-GUAIFENESIN ER 30-600 MG PO TB12: 1.0000 | ORAL_TABLET | Freq: Two times a day (BID) | ORAL | 0 refills | Status: DC

## 2018-06-18 MED ORDER — OSELTAMIVIR PHOSPHATE 75 MG PO CAPS: 75.0000 mg | ORAL_CAPSULE | Freq: Two times a day (BID) | ORAL | 0 refills | Status: DC

## 2018-06-18 NOTE — Evaluation (Signed)
Occupational Therapy Evaluation Patient Details Name: Courtney Zuniga MRN: [id number] DOB: 07-28-1950 Today's Date: 06/18/2018    History of Present Illness Courtney Zuniga is a 68 y.o. female with a past medical history significant for COPD/asthma, depression, lung cancer, gastroesophageal reflux disease, tobacco abuse, hypertension and chronic respiratory failure requiring 2 L oxygen supplementation; who presented to the emergency department secondary to increased shortness of breath, productive cough and general malaise.  Patient reported symptom has been present for the last 2-3 days and worsening.  She expressed chills and feeling warm (has not checked temperature at home), decreased appetite, general malaise and mildly productive coughing spells.  There has not been any signs of bloody sputum.    Clinical Impression   Pt reports feeling improved today, has been getting up and going to the bathroom without difficulty. Pt is performing ADLs and functional mobility with supervision, no LOB this am. Pt is at baseline with her ADL completion. Discussed energy conservation strategies with pt, who verbalizes using these at home to limit fatigue. No further OT services required at this time.     Follow Up Recommendations  No OT follow up    Equipment Recommendations  None recommended by OT       Precautions / Restrictions Precautions Precautions: Fall Restrictions Weight Bearing Restrictions: No      Mobility Bed Mobility Overal bed mobility: Independent                Transfers Overall transfer level: Needs assistance Equipment used: None Transfers: Sit to/from [employer] Transfers Sit to Stand: Supervision Stand pivot transfers: Supervision                ADL either performed or assessed with clinical judgement   ADL Overall ADL's : Needs assistance/impaired     Grooming: Wash/dry hands;Wash/dry face;Supervision/safety;Standing Grooming Details (indicate  cue type and reason): Pt standing at sink performing tasks without difficulty             Lower Body Dressing: Modified independent;Sitting/lateral leans   Toilet Transfer: Modified Independent;Regular Toilet;Ambulation   Toileting- Clothing Manipulation and Hygiene: Modified independent;Sitting/lateral lean;Sit to/from stand       Functional mobility during ADLs: Supervision/safety General ADL Comments: Pt performing ADLs, taking breaks as needed for fatigue     Vision Baseline Vision/History: No visual deficits Patient Visual Report: No change from baseline Vision Assessment?: No apparent visual deficits            Pertinent Vitals/Pain Pain Assessment: No/denies pain     Hand Dominance Right   Extremity/Trunk Assessment Upper Extremity Assessment Upper Extremity Assessment: Overall WFL for tasks assessed(BUE strength 4+/5)   Lower Extremity Assessment Lower Extremity Assessment: Defer to PT evaluation   Cervical / Trunk Assessment Cervical / Trunk Assessment: Normal   Communication Communication Communication: No difficulties   Cognition Arousal/Alertness: Awake/alert Behavior During Therapy: WFL for tasks assessed/performed Overall Cognitive Status: Within Functional Limits for tasks assessed                                                Home Living Family/patient expects to be discharged to:: Private residence Living Arrangements: Alone Available Help at Discharge: Family;Friend(s);Available 24 hours/day Type of Home: Mobile home Home Access: Stairs to enter Entrance Stairs-Number of Steps: 3 Entrance Stairs-Rails: Right;Left;Can reach both Home Layout: One level     Bathroom  Shower/Tub: Teacher, early years/pre: Standard     Home Equipment: Environmental consultant - 2 wheels;Shower seat;Grab bars - tub/shower   Additional Comments: On 2 LPM home O2      Prior Functioning/Environment Level of Independence: Independent         Comments: community ambulator, drives; Independent in ADLs        OT Problem List: Cardiopulmonary status limiting activity       End of Session Equipment Utilized During Treatment: Oxygen  Activity Tolerance: Patient tolerated treatment well Patient left: in chair;with call bell/phone within reach  OT Visit Diagnosis: Muscle weakness (generalized) (M62.81)                Time: 5248-1859 OT Time Calculation (min): 16 min Charges:  OT General Charges $OT Visit: 1 Visit OT Evaluation $OT Eval Low Complexity: [place], OTR/L  [phone] 06/18/2018, 7:58 AM

## 2018-06-18 NOTE — Progress Notes (Signed)
Physical Therapy Treatment Patient Details Name: Courtney Zuniga MRN: [id number] DOB: 08-02-1950 Today's Date: 06/18/2018    History of Present Illness Courtney Zuniga is a 68 y.o. Zuniga with a past medical history significant for COPD/asthma, depression, lung cancer, gastroesophageal reflux disease, tobacco abuse, hypertension and chronic respiratory failure requiring 2 L oxygen supplementation; who presented to the emergency department secondary to increased shortness of breath, productive cough and general malaise.  Patient reported symptom has been present for the last 2-3 days and worsening.  She expressed chills and feeling warm (has not checked temperature at home), decreased appetite, general malaise and mildly productive coughing spells.  There has not been any signs of bloody sputum.     PT Comments    Patient lying in bed c/o being cold when arrived. Patient agreeable to participating in therapy. Patient independently performed bed mobility with supervision for transfers. Patient on 3 LPM O2 with SpO2 at 95% and HR 94 bpm seated. Patient ambulated pushing portable O2 tank on 3 LPM covering 300 feet without standing rest breaks today. PT utilized min guard for safety. Patient limited by fatigue, no overt LOB. SpO2 readings 94% with HR 104 during ambulation.  Patient would continue to benefit from skilled physical therapy in current environment and next venue to continue return to prior function and increase strength, endurance, balance, coordination, and functional mobility and gait skills.     Follow Up Recommendations  Home health PT;Supervision - Intermittent     Equipment Recommendations  None recommended by PT    Recommendations for Other Services       Precautions / Restrictions Precautions Precautions: Fall Restrictions Weight Bearing Restrictions: No    Mobility  Bed Mobility Overal bed mobility: Independent                Transfers Overall transfer level:  Needs assistance Equipment used: None Transfers: Sit to/from Stand;Stand Pivot Transfers Sit to Stand: Supervision Stand pivot transfers: Supervision       General transfer comment: labored movement, tends to lean on nearby objects for support, required use of RW for safety  Ambulation/Gait Ambulation/Gait assistance: Min guard;Supervision Gait Distance (Feet): 300 Feet Assistive device: None Gait Pattern/deviations: Decreased step length - right;Decreased step length - left;Decreased stride length Gait velocity: decreased   General Gait Details: somewhat labored cadence without loss of balance pushing portable oxygen tank, no standing rest breaks today, on 3 LPM with SpO2 staying 92-95% during ambulation with HR 94-104   Stairs             Wheelchair Mobility    Modified Rankin (Stroke Patients Only)       Balance Overall balance assessment: Needs assistance Sitting-balance support: Feet supported;Bilateral upper extremity supported Sitting balance-Leahy Scale: Good     Standing balance support: During functional activity;No upper extremity supported Standing balance-Leahy Scale: Fair Standing balance comment: without assistive device                            Cognition Arousal/Alertness: Awake/alert Behavior During Therapy: WFL for tasks assessed/performed Overall Cognitive Status: Within Functional Limits for tasks assessed                                        Exercises      General Comments        Pertinent Vitals/Pain Pain Assessment: No/denies pain  Home Living Family/patient expects to be discharged to:: Private residence Living Arrangements: Alone Available Help at Discharge: Family;Friend(s);Available 24 hours/day Type of Home: Mobile home Home Access: Stairs to enter Entrance Stairs-Rails: Right;Left;Can reach both Home Layout: One level Home Equipment: Walker - 2 wheels;Shower seat;Grab bars -  tub/shower Additional Comments: On 2 LPM home O2    Prior Function Level of Independence: Independent      Comments: community ambulator, drives; Independent in ADLs   PT Goals (current goals can now be found in the care plan section) Acute Rehab PT Goals Patient Stated Goal: return home with family to assist PT Goal Formulation: With patient Time For Goal Achievement: 06/21/18 Potential to Achieve Goals: Good Progress towards PT goals: Progressing toward goals    Frequency    Min 3X/week      PT Plan Current plan remains appropriate    Co-evaluation              AM-PAC PT "6 Clicks" Mobility   Outcome Measure  Help needed turning from your back to your side while in a flat bed without using bedrails?: None Help needed moving from lying on your back to sitting on the side of a flat bed without using bedrails?: None Help needed moving to and from a bed to a chair (including a wheelchair)?: A Little Help needed standing up from a chair using your arms (e.g., wheelchair or bedside chair)?: None Help needed to walk in hospital room?: A Little Help needed climbing 3-5 steps with a railing? : A Little 6 Click Score: 21    End of Session Equipment Utilized During Treatment: Oxygen;Gait belt Activity Tolerance: Patient tolerated treatment well;Patient limited by fatigue Patient left: with call bell/phone within reach;in bed Nurse Communication: Mobility status PT Visit Diagnosis: Unsteadiness on feet (R26.81);Other abnormalities of gait and mobility (R26.89);Muscle weakness (generalized) (M62.81)     Time: 7322-0254 PT Time Calculation (min) (ACUTE ONLY): 23 min  Charges:  $Gait Training: 8-22 mins $Therapeutic Activity: 8-22 mins                     Floria Raveling. Hartnett-Rands, MS, PT Per [place] [phone] 06/18/2018, 10:07 AM

## 2018-06-18 NOTE — Progress Notes (Addendum)
Nsg Discharge Note  Admit Date:  06/16/2018 Discharge date: 06/18/2018   Courtney Zuniga to be D/C'd Home per MD order.  AVS completed.  Copy for chart, and copy for patient signed, and dated. Patient/caregiver able to verbalize understanding.  Discharge Medication: Allergies as of 06/18/2018   No Known Allergies     Medication List    TAKE these medications   acetaminophen 325 MG tablet Commonly known as:  TYLENOL Take 2 tablets (650 mg total) by mouth every 6 (six) hours as needed for mild pain, fever or headache.   albuterol 108 (90 Base) MCG/ACT inhaler Commonly known as:  PROVENTIL HFA;VENTOLIN HFA Inhale 2 puffs into the lungs every 4 (four) hours as needed for shortness of breath.   Breo Ellipta 100-25 MCG/INH Aepb Generic drug:  fluticasone furoate-vilanterol Inhale 1 puff into the lungs daily.   cyclobenzaprine 10 MG tablet Commonly known as:  FLEXERIL Take 1 tablet (10 mg total) by mouth 2 (two) times daily. *MAy take one additional tablet as needed for muscle spasms   dextromethorphan-guaiFENesin 30-600 MG 12hr tablet Commonly known as:  MUCINEX DM Take 1 tablet by mouth 2 (two) times daily.   diclofenac sodium 1 % Gel Commonly known as:  VOLTAREN Apply topically 4 (four) times daily as needed (massge gel into affected area(s) as needed for pain).   doxycycline 100 MG tablet Commonly known as:  VIBRA-TABS Take 1 tablet (100 mg total) by mouth every 12 (twelve) hours for 3 days.   Ensure Take 237 mLs by mouth 3 (three) times daily between meals.   ipratropium-albuterol 0.5-2.5 (3) MG/3ML Soln Commonly known as:  DUONEB Take 3 mLs by nebulization 3 (three) times daily.   metoprolol tartrate 25 MG tablet Commonly known as:  LOPRESSOR Take 1 tablet (25 mg total) by mouth 2 (two) times daily.   nicotine 14 mg/24hr patch Commonly known as:  NICODERM CQ - dosed in mg/24 hours Place 1 patch (14 mg total) onto the skin daily.   oseltamivir 75 MG  capsule Commonly known as:  TAMIFLU Take 1 capsule (75 mg total) by mouth 2 (two) times daily.   oxyCODONE-acetaminophen 5-325 MG tablet Commonly known as:  PERCOCET/ROXICET Take 1 tablet by mouth every 8 (eight) hours as needed for moderate pain.   pantoprazole 40 MG tablet Commonly known as:  PROTONIX Take 1 tablet (40 mg total) by mouth daily.   predniSONE 20 MG tablet Commonly known as:  DELTASONE Take 3 tablets by mouth daily X 1 day; then 2 tablets by mouth daily X 2 days; then 1 tablet by mouth daily X 2 days; then 1/2 tablet by mouth daily X 3 days and stop prednisone. What changed:    how much to take  how to take this  when to take this  additional instructions   Robitussin 12 Hour Cough 30 MG/5ML liquid Generic drug:  dextromethorphan Take 30 mg by mouth every 4 (four) hours as needed for cough.   topiramate 25 MG tablet Commonly known as:  Topamax Take 1 tablet by mouth daily at bedtime X 1 week; then increase to 25mg  BID.   Vitamin D3 125 MCG (5000 UT) Caps Take 1 capsule by mouth daily.       Discharge Assessment: Vitals:   06/18/18 0813 06/18/18 1333  BP:  130/85  Pulse:  88  Resp:  18  Temp:  98.5 F (36.9 C)  SpO2: 94% 96%   Skin clean, dry and intact without evidence of skin  break down, no evidence of skin tears noted. IV catheter discontinued intact. Site without signs and symptoms of complications - no redness or edema noted at insertion site, patient denies c/o pain - only slight tenderness at site.  Dressing with slight pressure applied.  D/c Instructions-Education: Discharge instructions given to patient/family with verbalized understanding. D/c education completed with patient/family including follow up instructions, medication list, d/c activities limitations if indicated, with other d/c instructions as indicated by MD - patient able to verbalize understanding, all questions fully answered. Patient instructed to return to ED, call 911, or  call MD for any changes in condition.  Patient escorted via [place], and D/C home via private auto.Patient does not have her home oxygen for the ride home and does not want to wait for someone to bring it to her. Patient and family informed that it was not a good idea and that they would be responsible for the decision. Patient and family encouraged to wait but state they would be fine  Loa Socks, RN 06/18/2018 1:36 PM

## 2018-06-18 NOTE — Discharge Summary (Signed)
Physician Discharge Summary  Courtney Zuniga OEU:[id number] DOB: 12-22-1950 DOA: 06/16/2018  PCP: [place] Dienes, NP  Admit date: 06/16/2018 Discharge date: 06/18/2018  Time spent: 35 minutes  Recommendations for Outpatient Follow-up:  1. Repeat BMET to follow electrolytes and renal function 2. Reassess improvement on HA's and adjust preventive medication as needed. 3. Continue assisting patient with smoking cessation.    Discharge Diagnoses:  Principal Problem:   Acute on chronic respiratory failure with hypoxia (HCC) Active Problems:   HTN (hypertension)   COPD with acute exacerbation (HCC)   Adenocarcinoma of right lung, stage 3 (HCC)   Depression   Influenza A   Severe protein-calorie malnutrition (HCC)   GERD (gastroesophageal reflux disease)   Chronic nonintractable headache   Discharge Condition: stable and improved. Discharge home with instructions to follow up with PCP in 10 days.  Diet recommendation: heart healthy diet.  Filed Weights   06/16/18 1134  Weight: 66.1 kg    History of present illness:  68 y.o. female with a past medical history significant for COPD/asthma, depression, lung cancer, gastroesophageal reflux disease, tobacco abuse, hypertension and chronic respiratory failure requiring 2 L oxygen supplementation; who presented to the emergency department secondary to increased shortness of breath, productive cough and general malaise.  Patient reported symptom has been present for the last 2-3 days and worsening.  She expressed chills and feeling warm (has not checked temperature at home), decreased appetite, general malaise and mildly productive coughing spells.  There has not been any signs of bloody sputum.  Patient denies chest pain, nausea, vomiting, abdominal pain, dysuria, hematuria, melena, hematochezia, hematemesis, focal weakness or any other complaints.  In the ED patient was treated with Solu-Medrol, duo nebs, magnesium and higher level of oxygen  supplementation.  Chest x-ray demonstrated no acute cardiopulmonary process, but demonstrated chronic bronchitic changes.  Influenza by PCR positive for influenza A; patient treated with 1 dose of Tamiflu.  TR[facility] consulted to admit patient for further evaluation and management.  Hospital Course:  1-acute on chronic respiratory failure with hypoxia -Due to influenza A infection, bronchiectasis and COPD exacerbation. -Continue tapering steroids, resume rescue albuterol treatment and Breo-Ellipta maintenance.  -instructed to use Mucinex and flutter valve. -Will also continue doxycycline and continue treatment with Tamiflu for 3 more days. -Patient is back to 2L [place] supplementation and able to speak in full sentences. -no fever.   2-hypertension -Continue current antihypertensive regimen -Heart healthy diet has been ordered.  3-COPD with acute exacerbation -As mentioned above continue treatment with tapering steroids, nebulizer treatments, oxygen supplementation, antibiotics, mucolytic's and flutter valve. -resume Breo-Ellipta   4-influenza A infection -Continue treatment with Tamiflu. -3 days pending at discharge..  5-depression -Stable -No suicidal ideation or hallucination -Continue to follow her mood -Patient was no using any antidepressant regimen prior to admission.  6-adenocarcinoma of the right lung, stage III -Continue outpatient follow-up with oncology service.  7-severe protein calorie malnutrition -Continue feeding supplements -patient encourage to increase oral intake and to maintain adequate hydration.   8-GERD -Continue PPI  9-tobacco abuse -Cessation counseling has been provided -Continue nicotine patch.  Procedures:  See below for x-ray reports   Consultations:  None   Discharge Exam: Vitals:   06/18/18 0531 06/18/18 0813  BP: (!) 128/94   Pulse: 85   Resp: 20   Temp: 98 F (36.7 C)   SpO2: 100% 94%   General exam: Alert, awake, oriented  x 3; no fever, still with very little short of breath; but overall breathing easier  and ready for discharge. Speaking in full sentences and with O2 supplementation back to 2L. Respiratory system: improved air movement, mild rhonchi and exp wheezing appreciated, no using accessory muscles.  Cardiovascular system:RRR. No murmurs, rubs, gallops. Gastrointestinal system: Abdomen is nondistended, soft and nontender. No organomegaly or masses felt. Normal bowel sounds heard. Central nervous system: Alert and oriented. No focal neurological deficits. Extremities: No C/C/E, +pedal pulses Skin: No rashes, lesions or ulcers Psychiatry: Judgement and insight appear normal. Mood & affect appropriate.    Discharge Instructions   Discharge Instructions    Diet - low sodium heart healthy   Complete by:  As directed    Discharge instructions   Complete by:  As directed    Take medications as prescribed Stop smoking Follow up with PCP in 10 days Keep yourself well hydrated     Allergies as of 06/18/2018   No Known Allergies     Medication List    TAKE these medications   acetaminophen 325 MG tablet Commonly known as:  TYLENOL Take 2 tablets (650 mg total) by mouth every 6 (six) hours as needed for mild pain, fever or headache.   albuterol 108 (90 Base) MCG/ACT inhaler Commonly known as:  PROVENTIL HFA;VENTOLIN HFA Inhale 2 puffs into the lungs every 4 (four) hours as needed for shortness of breath.   Breo Ellipta 100-25 MCG/INH Aepb Generic drug:  fluticasone furoate-vilanterol Inhale 1 puff into the lungs daily.   cyclobenzaprine 10 MG tablet Commonly known as:  FLEXERIL Take 1 tablet (10 mg total) by mouth 2 (two) times daily. *MAy take one additional tablet as needed for muscle spasms   dextromethorphan-guaiFENesin 30-600 MG 12hr tablet Commonly known as:  MUCINEX DM Take 1 tablet by mouth 2 (two) times daily.   diclofenac sodium 1 % Gel Commonly known as:  VOLTAREN Apply  topically 4 (four) times daily as needed (massge gel into affected area(s) as needed for pain).   doxycycline 100 MG tablet Commonly known as:  VIBRA-TABS Take 1 tablet (100 mg total) by mouth every 12 (twelve) hours for 3 days.   Ensure Take 237 mLs by mouth 3 (three) times daily between meals.   ipratropium-albuterol 0.5-2.5 (3) MG/3ML Soln Commonly known as:  DUONEB Take 3 mLs by nebulization 3 (three) times daily.   metoprolol tartrate 25 MG tablet Commonly known as:  LOPRESSOR Take 1 tablet (25 mg total) by mouth 2 (two) times daily.   nicotine 14 mg/24hr patch Commonly known as:  NICODERM CQ - dosed in mg/24 hours Place 1 patch (14 mg total) onto the skin daily.   oseltamivir 75 MG capsule Commonly known as:  TAMIFLU Take 1 capsule (75 mg total) by mouth 2 (two) times daily.   oxyCODONE-acetaminophen 5-325 MG tablet Commonly known as:  PERCOCET/ROXICET Take 1 tablet by mouth every 8 (eight) hours as needed for moderate pain.   pantoprazole 40 MG tablet Commonly known as:  PROTONIX Take 1 tablet (40 mg total) by mouth daily.   predniSONE 20 MG tablet Commonly known as:  DELTASONE Take 3 tablets by mouth daily X 1 day; then 2 tablets by mouth daily X 2 days; then 1 tablet by mouth daily X 2 days; then 1/2 tablet by mouth daily X 3 days and stop prednisone. What changed:    how much to take  how to take this  when to take this  additional instructions   Robitussin 12 Hour Cough 30 MG/5ML liquid Generic drug:  dextromethorphan Take 30  mg by mouth every 4 (four) hours as needed for cough.   topiramate 25 MG tablet Commonly known as:  Topamax Take 1 tablet by mouth daily at bedtime X 1 week; then increase to 25mg  BID.   Vitamin D3 125 MCG (5000 UT) Caps Take 1 capsule by mouth daily.      No Known Allergies Follow-up Information    [place] Dienes, NP. Schedule an appointment as soon as possible for a visit in 10 day(s).   Specialty:  Nurse  Practitioner Contact information: [place] [address] [phone]           The results of significant diagnostics from this hospitalization (including imaging, microbiology, ancillary and laboratory) are listed below for reference.    Significant Diagnostic Studies: Dg Chest Port 1 View  Result Date: 06/16/2018 CLINICAL DATA:  SHORTNESS OF BREATH EXAM: PORTABLE CHEST 1 VIEW COMPARISON:  05/05/2018 FINDINGS: Cardiac shadow is stable. Lungs are hyperinflated consistent with COPD. Stable scarring/collapse of the right middle lobe is noted. Nipple shadows are noted bilaterally. No other focal abnormality is seen. IMPRESSION: Stable appearance of the chest when compared with the prior exam consistent with some right middle lobe scarring. COPD. Electronically Signed   By: Inez Catalina M.D.   On: 06/16/2018 07:04    Microbiology: Recent Results (from the past 240 hour(s))  Culture, blood (routine x 2)     Status: None (Preliminary result)   Collection Time: 06/16/18  6:13 AM  Result Value Ref Range Status   Specimen Description BLOOD LEFT ARM  Final   Special Requests   Final    BOTTLES DRAWN AEROBIC AND ANAEROBIC Blood Culture adequate volume   Culture   Final    NO GROWTH 2 DAYS Performed at [facility], [address] [place], [place] [address]    Report Status PENDING  Incomplete  Culture, blood (routine x 2)     Status: None (Preliminary result)   Collection Time: 06/16/18  6:21 AM  Result Value Ref Range Status   Specimen Description BLOOD LEFT HAND  Final   Special Requests   Final    BOTTLES DRAWN AEROBIC AND ANAEROBIC Blood Culture adequate volume   Culture   Final    NO GROWTH 2 DAYS Performed at [facility], [address], [place], [place] [address]    Report Status PENDING  Incomplete     Labs: Basic Metabolic Panel: Recent Labs  Lab 06/16/18 0613 06/17/18 0420  NA 142 140  K 3.5 4.7  CL 106 107  CO2 26 28  GLUCOSE 109* 120*  BUN 12 14   CREATININE 0.56 0.46  CALCIUM 8.9 8.7*  MG  --  2.0  PHOS  --  3.5   Liver Function Tests: Recent Labs  Lab 06/16/18 0613  AST 23  ALT 14  ALKPHOS 193*  BILITOT 0.4  PROT 6.8  ALBUMIN 3.9   CBC: Recent Labs  Lab 06/16/18 0613 06/17/18 0420  WBC 7.7 4.4  NEUTROABS 6.9  --   HGB 12.8 11.5*  HCT 38.8 34.9*  MCV 83.8 84.5  PLT 204 188    Signed:  Barton Dubois MD.  [facility] 06/18/2018, 1:21 PM

## 2018-06-18 NOTE — Care Management Important Message (Signed)
Important Message  Patient Details  Name: Courtney Zuniga MRN: [id number] Date of Birth: 12-20-1950   Medicare Important Message Given:  Yes    [name], Chauncey Reading, RN 06/18/2018, 2:15 PM

## 2018-06-18 NOTE — Care Management (Signed)
Courtney Zuniga of [facility] aware patient will DC today.

## 2018-06-18 NOTE — Telephone Encounter (Signed)
MM PAL 3/24 moved appointments to 3/25. Not able to leave message voicemail full. Schedule mailed.

## 2018-06-21 ENCOUNTER — Telehealth: Payer: Self-pay | Admitting: Medical Oncology

## 2018-06-21 LAB — CULTURE, BLOOD (ROUTINE X 2)
Culture: NO GROWTH
Culture: NO GROWTH
Special Requests: ADEQUATE
Special Requests: ADEQUATE

## 2018-06-21 NOTE — Telephone Encounter (Signed)
PT orders -ok for 2 x week for 4 weeks for genera strengthening, mobility and independence and home safety.

## 2018-07-06 ENCOUNTER — Ambulatory Visit: Payer: Medicare HMO

## 2018-07-06 ENCOUNTER — Other Ambulatory Visit: Payer: Medicare HMO

## 2018-07-06 ENCOUNTER — Other Ambulatory Visit: Payer: Self-pay | Admitting: Physician Assistant

## 2018-07-06 ENCOUNTER — Ambulatory Visit: Payer: Medicare HMO | Admitting: Internal Medicine

## 2018-07-06 DIAGNOSIS — C3491 Malignant neoplasm of unspecified part of right bronchus or lung: Secondary | ICD-10-CM

## 2018-07-07 ENCOUNTER — Telehealth: Payer: Self-pay | Admitting: Physician Assistant

## 2018-07-07 ENCOUNTER — Inpatient Hospital Stay (HOSPITAL_BASED_OUTPATIENT_CLINIC_OR_DEPARTMENT_OTHER): Payer: Medicare HMO | Admitting: Physician Assistant

## 2018-07-07 ENCOUNTER — Other Ambulatory Visit: Payer: Self-pay

## 2018-07-07 ENCOUNTER — Encounter: Payer: Self-pay | Admitting: Physician Assistant

## 2018-07-07 ENCOUNTER — Inpatient Hospital Stay: Payer: Medicare HMO | Attending: Oncology

## 2018-07-07 ENCOUNTER — Ambulatory Visit: Payer: Medicare HMO

## 2018-07-07 VITALS — BP 111/75 | HR 87 | Temp 99.1°F | Resp 20 | Ht 67.0 in | Wt 143.2 lb

## 2018-07-07 DIAGNOSIS — C342 Malignant neoplasm of middle lobe, bronchus or lung: Secondary | ICD-10-CM | POA: Diagnosis not present

## 2018-07-07 DIAGNOSIS — Z79899 Other long term (current) drug therapy: Secondary | ICD-10-CM | POA: Insufficient documentation

## 2018-07-07 DIAGNOSIS — J449 Chronic obstructive pulmonary disease, unspecified: Secondary | ICD-10-CM

## 2018-07-07 DIAGNOSIS — C3491 Malignant neoplasm of unspecified part of right bronchus or lung: Secondary | ICD-10-CM

## 2018-07-07 DIAGNOSIS — R51 Headache: Secondary | ICD-10-CM | POA: Diagnosis not present

## 2018-07-07 DIAGNOSIS — Z5112 Encounter for antineoplastic immunotherapy: Secondary | ICD-10-CM | POA: Insufficient documentation

## 2018-07-07 LAB — CBC WITH DIFFERENTIAL (CANCER CENTER ONLY)
Abs Immature Granulocytes: 0.03 10*3/uL (ref 0.00–0.07)
Basophils Absolute: 0 10*3/uL (ref 0.0–0.1)
Basophils Relative: 1 %
Eosinophils Absolute: 0.1 10*3/uL (ref 0.0–0.5)
Eosinophils Relative: 2 %
HEMATOCRIT: 39 % (ref 36.0–46.0)
HEMOGLOBIN: 12.8 g/dL (ref 12.0–15.0)
Immature Granulocytes: 1 %
LYMPHS PCT: 23 %
Lymphs Abs: 1.5 10*3/uL (ref 0.7–4.0)
MCH: 28.3 pg (ref 26.0–34.0)
MCHC: 32.8 g/dL (ref 30.0–36.0)
MCV: 86.1 fL (ref 80.0–100.0)
Monocytes Absolute: 0.4 10*3/uL (ref 0.1–1.0)
Monocytes Relative: 6 %
Neutro Abs: 4.4 10*3/uL (ref 1.7–7.7)
Neutrophils Relative %: 67 %
Platelet Count: 427 10*3/uL — ABNORMAL HIGH (ref 150–400)
RBC: 4.53 MIL/uL (ref 3.87–5.11)
RDW: 15.7 % — ABNORMAL HIGH (ref 11.5–15.5)
WBC Count: 6.4 10*3/uL (ref 4.0–10.5)
nRBC: 0 % (ref 0.0–0.2)

## 2018-07-07 LAB — CMP (CANCER CENTER ONLY)
ALT: 13 U/L (ref 0–44)
AST: 19 U/L (ref 15–41)
Albumin: 3 g/dL — ABNORMAL LOW (ref 3.5–5.0)
Alkaline Phosphatase: 213 U/L — ABNORMAL HIGH (ref 38–126)
Anion gap: 9 (ref 5–15)
BUN: 18 mg/dL (ref 8–23)
CO2: 24 mmol/L (ref 22–32)
CREATININE: 0.72 mg/dL (ref 0.44–1.00)
Calcium: 9.4 mg/dL (ref 8.9–10.3)
Chloride: 110 mmol/L (ref 98–111)
GFR, Est AFR Am: >60 mL/min (ref >60)
GFR, Estimated: >60 mL/min (ref >60)
Glucose, Bld: 64 mg/dL — ABNORMAL LOW (ref 70–99)
Potassium: 4.3 mmol/L (ref 3.5–5.1)
Sodium: 143 mmol/L (ref 135–145)
Total Bilirubin: <0.2 mg/dL — ABNORMAL LOW (ref 0.3–1.2)
Total Protein: 7.1 g/dL (ref 6.5–8.1)

## 2018-07-07 LAB — TSH: TSH: 2.812 u[IU]/mL (ref 0.308–3.960)

## 2018-07-07 MED ORDER — HEPARIN SOD (PORK) LOCK FLUSH 100 UNIT/ML IV SOLN
250.0000 [IU] | Freq: Once | INTRAVENOUS | Status: DC | PRN
  Filled 2018-07-07: qty 5

## 2018-07-07 MED ORDER — ALTEPLASE 2 MG IJ SOLR
2.0000 mg | Freq: Once | INTRAMUSCULAR | Status: DC | PRN
  Filled 2018-07-07: qty 2

## 2018-07-07 MED ORDER — SODIUM CHLORIDE 0.9% FLUSH
3.0000 mL | INTRAVENOUS | Status: DC | PRN
  Filled 2018-07-07: qty 10

## 2018-07-07 MED ORDER — SODIUM CHLORIDE 0.9 % IV SOLN
Freq: Once | INTRAVENOUS | Status: AC
  Administered 2018-07-07: 10:00:00 via INTRAVENOUS
  Filled 2018-07-07: qty 250

## 2018-07-07 MED ORDER — SODIUM CHLORIDE 0.9 % IV SOLN
480.0000 mg | Freq: Once | INTRAVENOUS | Status: AC
  Administered 2018-07-07: 480 mg via INTRAVENOUS
  Filled 2018-07-07: qty 48

## 2018-07-07 MED ORDER — SODIUM CHLORIDE 0.9% FLUSH
10.0000 mL | INTRAVENOUS | Status: DC | PRN
  Filled 2018-07-07: qty 10

## 2018-07-07 MED ORDER — HEPARIN SOD (PORK) LOCK FLUSH 100 UNIT/ML IV SOLN
500.0000 [IU] | Freq: Once | INTRAVENOUS | Status: DC | PRN
  Filled 2018-07-07: qty 5

## 2018-07-07 NOTE — Progress Notes (Signed)
[place] OFFICE PROGRESS NOTE  Barry Dienes, NP No address on file  DIAGNOSIS: Recurrent non-small cell lung cancer initially diagnosed as stage IIIA (T1b, N2, M0) non-small cell lung cancer, adenocarcinoma. She presented with right middle lobe pulmonary nodule, mediastinal lymphadenopathy and highly suspicious for small nodule in the left upper lobe that could change her stage to stage IV that could present another synchronous primary lesion in the left upper lobe. This was diagnosed in September 2017.  PRIOR THERAPY: 1) Concurrent chemoradiation with weekly carboplatin for AUC of 2 and paclitaxel 45 MG/M2 status post 6 cycles last dose was given 02/25/2016 with partial response. 2) Consolidation chemotherapy with carboplatin for AUC of 5 and paclitaxel 175 MG/M2 every 3 weeks with Neulasta support. First dose 04/22/2016. Status post 3 cycles.  CURRENT THERAPY: Second line treatment with immunotherapy with Nivolumab 480 mg IV every 4 weeks status post 15 cycles.  INTERVAL HISTORY: Caterin Bignell 68 y.o. female returns to the clinic for a follow-up visit.  The patient was recently hospitalized earlier this month for a COPD exacerbation and influenza. Today she is feeling fully recovered without any concerning complaints. She denies any fever, chills, night sweats, or weight loss.  She denies any chest pain, cough, shortness of breath, or hemoptysis.  She denies any nausea, vomiting, diarrhea, or constipation. The patient says that she gets an occasional headaches in the occipital region in the morning lasting approximately 30 minutes. She states this is not new and that she has been having this her entire life. She denies associated weakness, visual changes, or gait changes.  She continues to tolerate her immunotherapy with nivolumab well without any concerning complaints.  She is here for evaluation prior to starting cycle #16 today.  MEDICAL HISTORY: Past Medical History:   Diagnosis Date  . Anemia    as a young woman  . Arthritis    osteoartritis  . Asthma   . Brain tumor (benign) ([place]) 2005 [facility]   Benign  . Chronic headaches   . Chronic hip pain   . Chronic pain   . COPD (chronic obstructive pulmonary disease) ([place])   . Coronary artery disease   . Depression   . Depression 05/15/2016  . Encounter for antineoplastic chemotherapy 01/10/2016  . GERD (gastroesophageal reflux disease)   . Hypertension   . Lung cancer ([place]) dx'd 01/2016   currently on chemo and radiation   . NSTEMI (non-ST elevated myocardial infarction) ([place]) yrs ago  . On home O2    qhs 2 liters at hs and prn  . Pneumonia last time 2 yrs ago  . Shortness of breath dyspnea    with activity    ALLERGIES:  has No Known Allergies.  MEDICATIONS:  Current Outpatient Medications  Medication Sig Dispense Refill  . acetaminophen (TYLENOL) 325 MG tablet Take 2 tablets (650 mg total) by mouth every 6 (six) hours as needed for mild pain, fever or headache. 12 tablet 2  . albuterol (PROVENTIL HFA;VENTOLIN HFA) 108 (90 BASE) MCG/ACT inhaler Inhale 2 puffs into the lungs every 4 (four) hours as needed for shortness of breath.     Marland Kitchen BREO ELLIPTA 100-25 MCG/INH AEPB Inhale 1 puff into the lungs daily.     . Cholecalciferol (VITAMIN D3) 125 MCG (5000 UT) CAPS Take 1 capsule by mouth daily.  0  . cyclobenzaprine (FLEXERIL) 10 MG tablet Take 1 tablet (10 mg total) by mouth 2 (two) times daily. *MAy take one additional tablet as needed for  muscle spasms 60 tablet 2  . dextromethorphan-guaiFENesin (MUCINEX DM) 30-600 MG 12hr tablet Take 1 tablet by mouth 2 (two) times daily. 40 tablet 0  . diclofenac sodium (VOLTAREN) 1 % GEL Apply topically 4 (four) times daily as needed (massge gel into affected area(s) as needed for pain).   1  . ENSURE (ENSURE) Take 237 mLs by mouth 3 (three) times daily between meals.    Marland Kitchen ipratropium-albuterol (DUONEB) 0.5-2.5 (3) MG/3ML SOLN Take 3 mLs by nebulization 3  (three) times daily. 360 mL 2  . metoprolol tartrate (LOPRESSOR) 25 MG tablet Take 1 tablet (25 mg total) by mouth 2 (two) times daily. 60 tablet 1  . nicotine (NICODERM CQ - DOSED IN MG/24 HOURS) 14 mg/24hr patch Place 1 patch (14 mg total) onto the skin daily. 28 patch 0  . oseltamivir (TAMIFLU) 75 MG capsule Take 1 capsule (75 mg total) by mouth 2 (two) times daily. 6 capsule 0  . oxyCODONE-acetaminophen (PERCOCET/ROXICET) 5-325 MG tablet Take 1 tablet by mouth every 8 (eight) hours as needed for moderate pain. 12 tablet 0  . pantoprazole (PROTONIX) 40 MG tablet Take 1 tablet (40 mg total) by mouth daily. 30 tablet 3  . predniSONE (DELTASONE) 20 MG tablet Take 3 tablets by mouth daily X 1 day; then 2 tablets by mouth daily X 2 days; then 1 tablet by mouth daily X 2 days; then 1/2 tablet by mouth daily X 3 days and stop prednisone. 12 tablet 0  . ROBITUSSIN 12 HOUR COUGH 30 MG/5ML liquid Take 30 mg by mouth every 4 (four) hours as needed for cough.     . topiramate (TOPAMAX) 25 MG tablet Take 1 tablet by mouth daily at bedtime X 1 week; then increase to 25mg  BID. 45 tablet 2   No current facility-administered medications for this visit.     SURGICAL HISTORY:  Past Surgical History:  Procedure Laterality Date  . CHOLECYSTECTOMY    . COLONOSCOPY  2015   Results requested from [facility]  . COLONOSCOPY    . ESOPHAGOGASTRODUODENOSCOPY N/A 08/14/2015   Procedure: ESOPHAGOGASTRODUODENOSCOPY (EGD);  Surgeon: Daneil Dolin, MD;  [facility];  Service: Endoscopy;  Laterality: N/A;  215   . ESOPHAGOGASTRODUODENOSCOPY (EGD) WITH PROPOFOL N/A 09/13/2015   Procedure: ESOPHAGOGASTRODUODENOSCOPY (EGD) WITH PROPOFOL;  Surgeon: Milus Banister, MD;  Location: [facility];  Service: Endoscopy;  Laterality: N/A;  . EUS N/A 03/12/2017   Procedure: UPPER ENDOSCOPIC ULTRASOUND (EUS) RADIAL;  Surgeon: Milus Banister, MD;  Location: [facility];  Service: Endoscopy;  Laterality: N/A;  .  TUMOR REMOVAL  2005   Benign  . UPPER ESOPHAGEAL ENDOSCOPIC ULTRASOUND (EUS)  09/13/2015   Procedure: UPPER ESOPHAGEAL ENDOSCOPIC ULTRASOUND (EUS);  Surgeon: Milus Banister, MD;  Location: [facility];  Service: Endoscopy;;  . VIDEO BRONCHOSCOPY WITH ENDOBRONCHIAL NAVIGATION N/A 12/31/2015   Procedure: VIDEO BRONCHOSCOPY WITH ENDOBRONCHIAL NAVIGATION;  Surgeon: Melrose Nakayama, MD;  Location: [facility];  Service: Thoracic;  Laterality: N/A;  . VIDEO BRONCHOSCOPY WITH ENDOBRONCHIAL ULTRASOUND N/A 11/08/2015   Procedure: VIDEO BRONCHOSCOPY WITH ENDOBRONCHIAL ULTRASOUND;  Surgeon: Ivin Poot, MD;  Location: [facility] OR;  Service: Thoracic;  Laterality: N/A;    REVIEW OF SYSTEMS:   Review of Systems  Constitutional: Negative for appetite change, chills, fatigue, fever and unexpected weight change.  HENT:   Negative for mouth sores, nosebleeds, sore throat and trouble swallowing.   Eyes: Negative for eye problems and icterus.  Respiratory: Negative for cough,  hemoptysis, shortness of breath and wheezing.   Cardiovascular: Negative for chest pain and leg swelling.  Gastrointestinal: Negative for abdominal pain, constipation, diarrhea, nausea and vomiting.  Genitourinary: Negative for bladder incontinence, difficulty urinating, dysuria, frequency and hematuria.   Musculoskeletal: Negative for back pain, gait problem, neck pain and neck stiffness.  Skin: Negative for itching and rash.  Neurological: Positive for occasional headache. Negative for dizziness, extremity weakness, gait problem, light-headedness and seizures.  Hematological: Negative for adenopathy. Does not bruise/bleed easily.  Psychiatric/Behavioral: Negative for confusion, depression and sleep disturbance. The patient is not nervous/anxious.     PHYSICAL EXAMINATION:  Blood pressure 111/75, pulse 87, temperature 99.1 F (37.3 C), temperature source Oral, resp. rate 20, height 5\' 7"  (1.702 m), weight 143 lb 3.2 oz (65 kg), SpO2 100  %.  ECOG PERFORMANCE STATUS: 1 - Symptomatic but completely ambulatory  Physical Exam  Constitutional: Oriented to person, place, and time and well-developed, well-nourished, and in no distress. No distress.  HENT:  Head: Normocephalic and atraumatic.  Mouth/Throat: Oropharynx is clear and moist. No oropharyngeal exudate.  Eyes: Conjunctivae are normal. Right eye exhibits no discharge. Left eye exhibits no discharge. No scleral icterus.  Neck: Normal range of motion. Neck supple.  Cardiovascular: Normal rate, regular rhythm, normal heart sounds and intact distal pulses.   Pulmonary/Chest: Effort normal and breath sounds normal. No respiratory distress. No wheezes. No rales.  Abdominal: Soft. Bowel sounds are normal. Exhibits no distension and no mass. There is no tenderness.  Musculoskeletal: Normal range of motion. Exhibits no edema.  Lymphadenopathy:    No cervical adenopathy.  Neurological: Left facial twitching and weakness (baseline from brain tumor in 2005). Alert and oriented to person, place, and time. Exhibits normal muscle tone. Gait normal. Coordination normal.  Skin: Skin is warm and dry. No rash noted. Not diaphoretic. No erythema. No pallor.  Psychiatric: Mood, memory and judgment normal.  Vitals reviewed.  LABORATORY DATA: Lab Results  Component Value Date   WBC 6.4 07/07/2018   HGB 12.8 07/07/2018   HCT 39.0 07/07/2018   MCV 86.1 07/07/2018   PLT 427 (H) 07/07/2018      Chemistry      Component Value Date/Time   NA 143 07/07/2018 0854   NA 144 04/09/2017 0948   K 4.3 07/07/2018 0854   K 4.3 04/09/2017 0948   CL 110 07/07/2018 0854   CO2 24 07/07/2018 0854   CO2 25 04/09/2017 0948   BUN 18 07/07/2018 0854   BUN 16.8 04/09/2017 0948   CREATININE 0.72 07/07/2018 0854   CREATININE 0.8 04/09/2017 0948      Component Value Date/Time   CALCIUM 9.4 07/07/2018 0854   CALCIUM 9.1 04/09/2017 0948   ALKPHOS 213 (H) 07/07/2018 0854   ALKPHOS 216 (H) 04/09/2017  0948   AST 19 07/07/2018 0854   AST 12 04/09/2017 0948   ALT 13 07/07/2018 0854   ALT 13 04/09/2017 0948   BILITOT <0.2 (L) 07/07/2018 0854   BILITOT 0.22 04/09/2017 0948       RADIOGRAPHIC STUDIES:  Dg Chest Port 1 View  Result Date: 06/16/2018 CLINICAL DATA:  SHORTNESS OF BREATH EXAM: PORTABLE CHEST 1 VIEW COMPARISON:  05/05/2018 FINDINGS: Cardiac shadow is stable. Lungs are hyperinflated consistent with COPD. Stable scarring/collapse of the right middle lobe is noted. Nipple shadows are noted bilaterally. No other focal abnormality is seen. IMPRESSION: Stable appearance of the chest when compared with the prior exam consistent with some right middle lobe scarring. COPD. Electronically  Signed   By: Inez Catalina M.D.   On: 06/16/2018 07:04     ASSESSMENT/PLAN:  This is a very pleasant 68 year old African-American female with recurrent metastatic non-small cell lung cancer, adenocarcinoma of the right middle lobe.  She was initially diagnosed as stage IIIa in September 2017.  She completed concurrent chemoradiation followed by consolidation with carboplatin and paclitaxel. She is status post 3 cycles. The patient was on observation but a restaging CT scan showed evidence of disease progression. She is currently undergoing treatment with second line with immunotherapy with nivolumab 400 mg IV every 4 weeks.  She is status post 15 cycles.   The patient was seen with Dr. Julien Nordmann today.  She is feeling well today since being discharged from the hospital. She continues to tolerate treatment with nivolumab 480 mg IV every 4 weeks without any adverse side effects.  Labs were reviewed with the patient. I recommend she proceed with cycle #16 today as scheduled.  I will arrange for a restaging CT scan to be performed prior to the next visit. I will see her back in 4 weeks for evaluation and to review the scan results before starting cycle #17. The patient was advised to call immediately if she has any  concerning symptoms in the interval. The patient voices understanding of current disease status and treatment options and is in agreement with the current care plan. All questions were answered. The patient knows to call the clinic with any problems, questions or concerns. We can certainly see the patient much sooner if necessary   Orders Placed This Encounter  Procedures  . CT Chest W Contrast    Standing Status:   Future    Standing Expiration Date:   07/07/2019    Order Specific Question:   ** REASON FOR EXAM (FREE TEXT)    Answer:   Restaging Lung Cancer    Order Specific Question:   If indicated for the ordered procedure, I authorize the administration of contrast media per Radiology protocol    Answer:   Yes    Order Specific Question:   Preferred imaging location?    Answer:   [facility]    Order Specific Question:   Radiology Contrast Protocol - do NOT remove file path    Answer:   \\[email or web address]  . CT Abdomen Pelvis W Contrast    Standing Status:   Future    Standing Expiration Date:   07/07/2019    Order Specific Question:   ** REASON FOR EXAM (FREE TEXT)    Answer:   Restaging Lung Cancer    Order Specific Question:   If indicated for the ordered procedure, I authorize the administration of contrast media per Radiology protocol    Answer:   Yes    Order Specific Question:   Preferred imaging location?    Answer:   [facility]    Order Specific Question:   Is Oral Contrast requested for this exam?    Answer:   Yes, Per Radiology protocol    Order Specific Question:   Radiology Contrast Protocol - do NOT remove file path    Answer:   \\[email or web address]     Ngina Royer L Bravlio Luca, PA-C 07/07/18  ADDENDUM: Hematology/Oncology Attending: I had a face-to-face encounter with the patient today.  I recommended her care plan.  This is a very pleasant 68 years old African-American female with metastatic  non-small cell lung cancer, adenocarcinoma and currently undergoing treatment with nivolumab 480 MG IV every  4 weeks status post 15 cycles.  The patient has been tolerating her treatment well with no concerning adverse effects. I recommended for her to proceed with cycle #16 today as scheduled. We will arrange for the patient to have repeat CT scan of the chest, abdomen and pelvis before her next treatment in 4 weeks. She was advised to call immediately if she has any concerning symptoms in the interval.  Disclaimer: This note was dictated with voice recognition software. Similar sounding words can inadvertently be transcribed and may be missed upon review. Eilleen Kempf, MD 07/07/18

## 2018-07-07 NOTE — Telephone Encounter (Signed)
Gave avs and calendar ° °

## 2018-07-30 ENCOUNTER — Ambulatory Visit (HOSPITAL_COMMUNITY)
Admission: RE | Admit: 2018-07-30 | Discharge: 2018-07-30 | Disposition: A | Discharge status: Home / Self Care | Payer: Medicare HMO | Source: Ambulatory Visit | Attending: Physician Assistant | Admitting: Physician Assistant

## 2018-07-30 ENCOUNTER — Other Ambulatory Visit: Payer: Self-pay

## 2018-07-30 DIAGNOSIS — C3491 Malignant neoplasm of unspecified part of right bronchus or lung: Secondary | ICD-10-CM

## 2018-07-30 MED ORDER — IOHEXOL 300 MG/ML  SOLN
100.0000 mL | Freq: Once | INTRAMUSCULAR | Status: AC | PRN
  Administered 2018-07-30: 09:00:00 100 mL via INTRAVENOUS

## 2018-07-30 MED ORDER — SODIUM CHLORIDE (PF) 0.9 % IJ SOLN
INTRAMUSCULAR | Status: AC
  Filled 2018-07-30: qty 50

## 2018-07-30 MED ORDER — IOHEXOL 300 MG/ML  SOLN
30.0000 mL | Freq: Once | INTRAMUSCULAR | Status: AC | PRN
  Administered 2018-07-30: 07:00:00 30 mL via ORAL

## 2018-08-02 ENCOUNTER — Inpatient Hospital Stay: Payer: Medicare HMO | Attending: Oncology

## 2018-08-02 ENCOUNTER — Other Ambulatory Visit: Payer: Self-pay

## 2018-08-02 ENCOUNTER — Encounter: Payer: Self-pay | Admitting: Internal Medicine

## 2018-08-02 ENCOUNTER — Inpatient Hospital Stay: Payer: Medicare HMO

## 2018-08-02 ENCOUNTER — Inpatient Hospital Stay (HOSPITAL_BASED_OUTPATIENT_CLINIC_OR_DEPARTMENT_OTHER): Payer: Medicare HMO | Admitting: Internal Medicine

## 2018-08-02 VITALS — BP 110/77 | HR 87 | Temp 98.2°F | Resp 18 | Ht 67.0 in | Wt 143.1 lb

## 2018-08-02 DIAGNOSIS — R0609 Other forms of dyspnea: Secondary | ICD-10-CM | POA: Diagnosis not present

## 2018-08-02 DIAGNOSIS — I10 Essential (primary) hypertension: Secondary | ICD-10-CM

## 2018-08-02 DIAGNOSIS — R911 Solitary pulmonary nodule: Secondary | ICD-10-CM

## 2018-08-02 DIAGNOSIS — C342 Malignant neoplasm of middle lobe, bronchus or lung: Secondary | ICD-10-CM | POA: Diagnosis not present

## 2018-08-02 DIAGNOSIS — Z79899 Other long term (current) drug therapy: Secondary | ICD-10-CM | POA: Insufficient documentation

## 2018-08-02 DIAGNOSIS — C3491 Malignant neoplasm of unspecified part of right bronchus or lung: Secondary | ICD-10-CM

## 2018-08-02 DIAGNOSIS — Z5112 Encounter for antineoplastic immunotherapy: Secondary | ICD-10-CM | POA: Insufficient documentation

## 2018-08-02 LAB — CBC WITH DIFFERENTIAL (CANCER CENTER ONLY)
Abs Immature Granulocytes: 0.01 10*3/uL (ref 0.00–0.07)
Basophils Absolute: 0 10*3/uL (ref 0.0–0.1)
Basophils Relative: 1 %
Eosinophils Absolute: 0.1 10*3/uL (ref 0.0–0.5)
Eosinophils Relative: 2 %
HCT: 41.4 % (ref 36.0–46.0)
Hemoglobin: 13.6 g/dL (ref 12.0–15.0)
Immature Granulocytes: 0 %
Lymphocytes Relative: 19 %
Lymphs Abs: 1.1 10*3/uL (ref 0.7–4.0)
MCH: 28.2 pg (ref 26.0–34.0)
MCHC: 32.9 g/dL (ref 30.0–36.0)
MCV: 85.9 fL (ref 80.0–100.0)
Monocytes Absolute: 0.3 10*3/uL (ref 0.1–1.0)
Monocytes Relative: 5 %
Neutro Abs: 4.1 10*3/uL (ref 1.7–7.7)
Neutrophils Relative %: 73 %
Platelet Count: 249 10*3/uL (ref 150–400)
RBC: 4.82 MIL/uL (ref 3.87–5.11)
RDW: 16.2 % — ABNORMAL HIGH (ref 11.5–15.5)
WBC Count: 5.5 10*3/uL (ref 4.0–10.5)
nRBC: 0 % (ref 0.0–0.2)

## 2018-08-02 LAB — CMP (CANCER CENTER ONLY)
ALT: <6 U/L (ref 0–44)
AST: 15 U/L (ref 15–41)
Albumin: 3.5 g/dL (ref 3.5–5.0)
Alkaline Phosphatase: 243 U/L — ABNORMAL HIGH (ref 38–126)
Anion gap: 11 (ref 5–15)
BUN: 15 mg/dL (ref 8–23)
CO2: 23 mmol/L (ref 22–32)
Calcium: 9 mg/dL (ref 8.9–10.3)
Chloride: 109 mmol/L (ref 98–111)
Creatinine: 0.75 mg/dL (ref 0.44–1.00)
GFR, Est AFR Am: >60 mL/min (ref >60)
GFR, Estimated: >60 mL/min (ref >60)
Glucose, Bld: 88 mg/dL (ref 70–99)
Potassium: 3.8 mmol/L (ref 3.5–5.1)
Sodium: 143 mmol/L (ref 135–145)
Total Bilirubin: <0.2 mg/dL — ABNORMAL LOW (ref 0.3–1.2)
Total Protein: 7.1 g/dL (ref 6.5–8.1)

## 2018-08-02 LAB — TSH: TSH: 2.32 u[IU]/mL (ref 0.308–3.960)

## 2018-08-02 MED ORDER — SODIUM CHLORIDE 0.9 % IV SOLN
480.0000 mg | Freq: Once | INTRAVENOUS | Status: AC
  Administered 2018-08-02: 480 mg via INTRAVENOUS
  Filled 2018-08-02: qty 48

## 2018-08-02 MED ORDER — SODIUM CHLORIDE 0.9 % IV SOLN
Freq: Once | INTRAVENOUS | Status: AC
  Administered 2018-08-02: 11:00:00 via INTRAVENOUS
  Filled 2018-08-02: qty 250

## 2018-08-02 NOTE — Patient Instructions (Signed)
Coronavirus (COVID-19) Are you at risk?  Are you at risk for the Coronavirus (COVID-19)?  To be considered HIGH RISK for Coronavirus (COVID-19), you have to meet the following criteria:  . Traveled to [place], [place], [place], [place] or [place]; or in the [place] to [place], [place], [place], or [place]; and have fever, cough, and shortness of breath within the last 2 weeks of travel OR . Been in close contact with a person diagnosed with COVID-19 within the last 2 weeks and have fever, cough, and shortness of breath . IF YOU DO NOT MEET THESE CRITERIA, YOU ARE CONSIDERED LOW RISK FOR COVID-19.  What to do if you are HIGH RISK for COVID-19?  . If you are having a medical emergency, call 911. . Seek medical care right away. Before you go to a doctor's office, urgent care or emergency department, call ahead and tell them about your recent travel, contact with someone diagnosed with COVID-19, and your symptoms. You should receive instructions from your physician's office regarding next steps of care.  . When you arrive at healthcare provider, tell the healthcare staff immediately you have returned from visiting [place], [place], [place], [place] or [place]; or traveled in the [place] to [place], [place], [place], or [place]; in the last two weeks or you have been in close contact with a person diagnosed with COVID-19 in the last 2 weeks.   . Tell the health care staff about your symptoms: fever, cough and shortness of breath. . After you have been seen by a medical provider, you will be either: o Tested for (COVID-19) and discharged home on quarantine except to seek medical care if symptoms worsen, and asked to  - Stay home and avoid contact with others until you get your results (4-5 days)  - Avoid travel on public transportation if possible (such as bus, train, or airplane) or o Sent to the Emergency Department by EMS for evaluation, COVID-19 testing, and possible  admission depending on your condition and test results.  What to do if you are LOW RISK for COVID-19?  Reduce your risk of any infection by using the same precautions used for avoiding the common cold or flu:  . Wash your hands often with soap and warm water for at least 20 seconds.  If soap and water are not readily available, use an alcohol-based hand sanitizer with at least 60% alcohol.  . If coughing or sneezing, cover your mouth and nose by coughing or sneezing into the elbow areas of your shirt or coat, into a tissue or into your sleeve (not your hands). . Avoid shaking hands with others and consider head nods or verbal greetings only. . Avoid touching your eyes, nose, or mouth with unwashed hands.  . Avoid close contact with people who are sick. . Avoid places or events with large numbers of people in one location, like concerts or sporting events. . Carefully consider travel plans you have or are making. . If you are planning any travel outside or inside the [place], visit the CDC's Travelers' Health webpage for the latest health notices. . If you have some symptoms but not all symptoms, continue to monitor at home and seek medical attention if your symptoms worsen. . If you are having a medical emergency, call 911.   ADDITIONAL HEALTHCARE OPTIONS FOR PATIENTS  [facility] / e-Visit: [email or web address]         [facility]: [phone]  [place]   Urgent Care: [facility]: [place] Discharge Instructions for Patients Receiving Chemotherapy  Today you received the following chemotherapy agents Opdivo  To help prevent nausea and vomiting after your treatment, we encourage you to take your nausea medication as directed.    If you develop nausea and vomiting that is not controlled by your nausea medication, call the clinic.   BELOW ARE  SYMPTOMS THAT SHOULD BE REPORTED IMMEDIATELY:  *FEVER GREATER THAN 100.5 F  *CHILLS WITH OR WITHOUT FEVER  NAUSEA AND VOMITING THAT IS NOT CONTROLLED WITH YOUR NAUSEA MEDICATION  *UNUSUAL SHORTNESS OF BREATH  *UNUSUAL BRUISING OR BLEEDING  TENDERNESS IN MOUTH AND THROAT WITH OR WITHOUT PRESENCE OF ULCERS  *URINARY PROBLEMS  *BOWEL PROBLEMS  UNUSUAL RASH Items with * indicate a potential emergency and should be followed up as soon as possible.  Feel free to call the clinic should you have any questions or concerns. The clinic phone number is [phone].  Please show the [facility] at check-in to the Emergency Department and triage nurse.

## 2018-08-02 NOTE — Progress Notes (Signed)
[place] [phone]   [phone]  OFFICE PROGRESS NOTE  Barry Dienes, NP No address on file  DIAGNOSIS: Recurrent non-small cell lung cancer initially diagnosed as stage IIIA (T1b, N2, M0) non-small cell lung cancer presented with right middle lobe pulmonary nodule, mediastinal lymphadenopathy and highly suspicious for small nodule in the left upper lobe that could change her stage to stage IV that could present another synchronous primary lesion in the left upper lobe. This was diagnosed in September 2017.  PRIOR THERAPY:  1) Concurrent chemoradiation with weekly carboplatin for AUC of 2 and paclitaxel 45 MG/M2 status post 6 cycles last dose was given 02/25/2016 with partial response. 2) Consolidation chemotherapy with carboplatin for AUC of 5 and paclitaxel 175 MG/M2 every 3 weeks with Neulasta support. First dose 04/22/2016. Status post 3 cycles.  CURRENT THERAPY: Second line treatment with immunotherapy with Nivolumab 480 mg IV every 4 weeks status post 16 cycles..  INTERVAL HISTORY: Courtney Zuniga 68 y.o. female returns to the clinic today for follow-up visit.  The patient is feeling fine today with no concerning complaints except for the baseline shortness of breath increased with exertion with no significant chest pain, cough or hemoptysis.  She denied having any recent weight loss or night sweats.  She has no nausea, vomiting, diarrhea or constipation.  She denied having any headache or visual changes.  She has no fever or chills.  She continues to tolerate her treatment with nivolumab fairly well.  She had repeat CT scan of the chest performed recently and she is here for evaluation and discussion of her scan results.   MEDICAL HISTORY: Past Medical History:  Diagnosis Date  . Anemia    as a young woman  . Arthritis    osteoartritis  . Asthma   . Brain tumor (benign) ([place]) 2005 [facility]   Benign  . Chronic headaches   . Chronic hip  pain   . Chronic pain   . COPD (chronic obstructive pulmonary disease) ([place])   . Coronary artery disease   . Depression   . Depression 05/15/2016  . Encounter for antineoplastic chemotherapy 01/10/2016  . GERD (gastroesophageal reflux disease)   . Hypertension   . Lung cancer ([place]) dx'd 01/2016   currently on chemo and radiation   . NSTEMI (non-ST elevated myocardial infarction) ([place]) yrs ago  . On home O2    qhs 2 liters at hs and prn  . Pneumonia last time 2 yrs ago  . Shortness of breath dyspnea    with activity    ALLERGIES:  has No Known Allergies.  MEDICATIONS:  Current Outpatient Medications  Medication Sig Dispense Refill  . acetaminophen (TYLENOL) 325 MG tablet Take 2 tablets (650 mg total) by mouth every 6 (six) hours as needed for mild pain, fever or headache. 12 tablet 2  . albuterol (PROVENTIL HFA;VENTOLIN HFA) 108 (90 BASE) MCG/ACT inhaler Inhale 2 puffs into the lungs every 4 (four) hours as needed for shortness of breath.     Marland Kitchen BREO ELLIPTA 100-25 MCG/INH AEPB Inhale 1 puff into the lungs daily.     . Cholecalciferol (VITAMIN D3) 125 MCG (5000 UT) CAPS Take 1 capsule by mouth daily.  0  . cyclobenzaprine (FLEXERIL) 10 MG tablet Take 1 tablet (10 mg total) by mouth 2 (two) times daily. *MAy take one additional tablet as needed for muscle spasms 60 tablet 2  . dextromethorphan-guaiFENesin (MUCINEX DM) 30-600 MG 12hr tablet Take 1 tablet  by mouth 2 (two) times daily. 40 tablet 0  . diclofenac sodium (VOLTAREN) 1 % GEL Apply topically 4 (four) times daily as needed (massge gel into affected area(s) as needed for pain).   1  . ENSURE (ENSURE) Take 237 mLs by mouth 3 (three) times daily between meals.    Marland Kitchen ipratropium-albuterol (DUONEB) 0.5-2.5 (3) MG/3ML SOLN Take 3 mLs by nebulization 3 (three) times daily. 360 mL 2  . metoprolol tartrate (LOPRESSOR) 25 MG tablet Take 1 tablet (25 mg total) by mouth 2 (two) times daily. 60 tablet 1  . nicotine (NICODERM CQ - DOSED IN MG/24  HOURS) 14 mg/24hr patch Place 1 patch (14 mg total) onto the skin daily. 28 patch 0  . oseltamivir (TAMIFLU) 75 MG capsule Take 1 capsule (75 mg total) by mouth 2 (two) times daily. 6 capsule 0  . oxyCODONE-acetaminophen (PERCOCET/ROXICET) 5-325 MG tablet Take 1 tablet by mouth every 8 (eight) hours as needed for moderate pain. 12 tablet 0  . pantoprazole (PROTONIX) 40 MG tablet Take 1 tablet (40 mg total) by mouth daily. 30 tablet 3  . predniSONE (DELTASONE) 20 MG tablet Take 3 tablets by mouth daily X 1 day; then 2 tablets by mouth daily X 2 days; then 1 tablet by mouth daily X 2 days; then 1/2 tablet by mouth daily X 3 days and stop prednisone. 12 tablet 0  . ROBITUSSIN 12 HOUR COUGH 30 MG/5ML liquid Take 30 mg by mouth every 4 (four) hours as needed for cough.     . topiramate (TOPAMAX) 25 MG tablet Take 1 tablet by mouth daily at bedtime X 1 week; then increase to 25mg  BID. 45 tablet 2   No current facility-administered medications for this visit.     SURGICAL HISTORY:  Past Surgical History:  Procedure Laterality Date  . CHOLECYSTECTOMY    . COLONOSCOPY  2015   Results requested from [facility]  . COLONOSCOPY    . ESOPHAGOGASTRODUODENOSCOPY N/A 08/14/2015   Procedure: ESOPHAGOGASTRODUODENOSCOPY (EGD);  Surgeon: Daneil Dolin, MD;  Location[facility];  Service: Endoscopy;  Laterality: N/A;  215   . ESOPHAGOGASTRODUODENOSCOPY (EGD) WITH PROPOFOL N/A 09/13/2015   Procedure: ESOPHAGOGASTRODUODENOSCOPY (EGD) WITH PROPOFOL;  Surgeon: Milus Banister, MD;  Location: [facility];  Service: Endoscopy;  Laterality: N/A;  . EUS N/A 03/12/2017   Procedure: UPPER ENDOSCOPIC ULTRASOUND (EUS) RADIAL;  Surgeon: Milus Banister, MD;  Location: [facility];  Service: Endoscopy;  Laterality: N/A;  . TUMOR REMOVAL  2005   Benign  . UPPER ESOPHAGEAL ENDOSCOPIC ULTRASOUND (EUS)  09/13/2015   Procedure: UPPER ESOPHAGEAL ENDOSCOPIC ULTRASOUND (EUS);  Surgeon: Milus Banister, MD;  Location:  [facility];  Service: Endoscopy;;  . VIDEO BRONCHOSCOPY WITH ENDOBRONCHIAL NAVIGATION N/A 12/31/2015   Procedure: VIDEO BRONCHOSCOPY WITH ENDOBRONCHIAL NAVIGATION;  Surgeon: Melrose Nakayama, MD;  Location: [facility];  Service: Thoracic;  Laterality: N/A;  . VIDEO BRONCHOSCOPY WITH ENDOBRONCHIAL ULTRASOUND N/A 11/08/2015   Procedure: VIDEO BRONCHOSCOPY WITH ENDOBRONCHIAL ULTRASOUND;  Surgeon: Ivin Poot, MD;  Location: [place];  Service: Thoracic;  Laterality: N/A;    REVIEW OF SYSTEMS:  Constitutional: negative Eyes: negative Ears, nose, mouth, throat, and face: negative Respiratory: positive for dyspnea on exertion Cardiovascular: negative Gastrointestinal: negative Genitourinary:negative Integument/breast: negative Hematologic/lymphatic: negative Musculoskeletal:negative Neurological: negative Behavioral/Psych: negative Endocrine: negative Allergic/Immunologic: negative   PHYSICAL EXAMINATION: General appearance: alert, cooperative and no distress Head: Normocephalic, without obvious abnormality, atraumatic Neck: no adenopathy, no JVD, supple, symmetrical, trachea midline and  thyroid not enlarged, symmetric, no tenderness/mass/nodules Lymph nodes: Cervical, supraclavicular, and axillary nodes normal. Resp: clear to auscultation bilaterally Back: symmetric, no curvature. ROM normal. No CVA tenderness. Cardio: regular rate and rhythm, S1, S2 normal, no murmur, click, rub or gallop GI: soft, non-tender; bowel sounds normal; no masses,  no organomegaly Extremities: extremities normal, atraumatic, no cyanosis or edema Neurologic: Alert and oriented X 3, normal strength and tone. Normal symmetric reflexes. Normal coordination and gait  ECOG PERFORMANCE STATUS: 1 - Symptomatic but completely ambulatory  Blood pressure 110/77, pulse 87, temperature 98.2 F (36.8 C), temperature source Oral, resp. rate 18, height 5\' 7"  (1.702 m), weight 143 lb 1.6 oz (64.9 kg), SpO2 98 %.   LABORATORY DATA: Lab Results  Component Value Date   WBC 5.5 08/02/2018   HGB 13.6 08/02/2018   HCT 41.4 08/02/2018   MCV 85.9 08/02/2018   PLT 249 08/02/2018      Chemistry      Component Value Date/Time   NA 143 07/07/2018 0854   NA 144 04/09/2017 0948   K 4.3 07/07/2018 0854   K 4.3 04/09/2017 0948   CL 110 07/07/2018 0854   CO2 24 07/07/2018 0854   CO2 25 04/09/2017 0948   BUN 18 07/07/2018 0854   BUN 16.8 04/09/2017 0948   CREATININE 0.72 07/07/2018 0854   CREATININE 0.8 04/09/2017 0948      Component Value Date/Time   CALCIUM 9.4 07/07/2018 0854   CALCIUM 9.1 04/09/2017 0948   ALKPHOS 213 (H) 07/07/2018 0854   ALKPHOS 216 (H) 04/09/2017 0948   AST 19 07/07/2018 0854   AST 12 04/09/2017 0948   ALT 13 07/07/2018 0854   ALT 13 04/09/2017 0948   BILITOT <0.2 (L) 07/07/2018 0854   BILITOT 0.22 04/09/2017 0948       RADIOGRAPHIC STUDIES:-   Ct Chest W Contrast  Result Date: 07/30/2018 CLINICAL DATA:  Follow-up with history of right lung cancer diagnosed October 2017 with ongoing chemotherapy. Radiation treatment completed 2017. No current respiratory complaints. EXAM: CT CHEST, ABDOMEN, AND PELVIS WITH CONTRAST TECHNIQUE: Multidetector CT imaging of the chest, abdomen and pelvis was performed following the standard protocol during bolus administration of intravenous contrast. CONTRAST:  112mL OMNIPAQUE IOHEXOL 300 MG/ML SOLN, 74mL OMNIPAQUE IOHEXOL 300 MG/ML SOLN COMPARISON:  05/05/2018 and 03/09/2018 FINDINGS: CT CHEST FINDINGS Cardiovascular: Heart is normal size. Mild calcified plaque over the left main and left anterior descending coronary arteries. Mild calcified plaque over the thoracic aorta. Minimal prominence of the pulmonary arteries which are otherwise unremarkable. Remaining vascular structures are unremarkable. Mediastinum/Nodes: No mediastinal or hilar adenopathy. Remaining mediastinal structures are within normal. No axillary adenopathy. Lungs/Pleura: Lungs  are well inflated demonstrate diffuse moderate centrilobular emphysematous disease. No evidence of effusion. Linear scarring with areas of septal thickening over the medial right upper lobe/apex with interval improvement. Small right middle lobe containing linear density with nodularity which is the site of patient's original lung cancer as this is stable. Interval improvement and patchy right lower lobe airspace process and posterior basilar atelectasis. New spiculated nodular density over the posterior right lower lobe measuring 1.5 x 1.8 cm with patchy peripheral heterogeneous density over the lateral right lower lobe. Linear scarring posterior left base. Left lung is otherwise unchanged and clear. Airways are unremarkable. Musculoskeletal: Unchanged. CT ABDOMEN PELVIS FINDINGS Hepatobiliary: Previous cholecystectomy. Liver and biliary tree are within normal. Stable prominence of the common bile duct post cholecystectomy. Pancreas: Normal. Spleen: Normal. Adrenals/Urinary Tract: Adrenal glands are normal. Kidneys  normal in size without hydronephrosis or nephrolithiasis. Few small bilateral renal cysts unchanged. Ureters and bladder are normal. Stomach/Bowel: Stomach and small bowel are normal. Appendix is normal. Colon is unremarkable. Vascular/Lymphatic: Mild calcified plaque over the abdominal aorta. No adenopathy. Reproductive: Several uterine fibroids with 3.7 cm calcified left-sided uterine fibroid unchanged. Adnexal regions are within normal. Other: No free fluid or focal inflammatory change. Musculoskeletal: Degenerative change of the spine and hips. No focal lytic or sclerotic lesions. IMPRESSION: 1. Stable linear density with nodularity over the right middle lobe compatible with site of patient's original lung cancer. Scarring medial right upper lobe/apex with less septal thickening. Interval improved heterogeneous airspace density over the right lower lobe and improved posterior basilar atelectasis. New  irregular 1.5 x 1.8 cm focus of nodular opacification over the posterior right lower lobe as well as patchy peripheral opacification over the lateral right lower lobe. Findings may be due to atypical infectious or inflammatory process as metastatic disease is possible. No mediastinal/hilar adenopathy. Recommend attention on follow-up. 2.  No evidence of metastatic disease within the abdomen/pelvis. 3. Aortic Atherosclerosis (ICD10-I70.0) and Emphysema (ICD10-J43.9). Mild atherosclerotic coronary artery disease. 4. Stable chronic findings within the abdomen and pelvis as described including several small bilateral renal cysts and leiomyomatous uterus. Electronically Signed   By: Marin Olp M.D.   On: 07/30/2018 10:45   Ct Abdomen Pelvis W Contrast  Result Date: 07/30/2018 CLINICAL DATA:  Follow-up with history of right lung cancer diagnosed October 2017 with ongoing chemotherapy. Radiation treatment completed 2017. No current respiratory complaints. EXAM: CT CHEST, ABDOMEN, AND PELVIS WITH CONTRAST TECHNIQUE: Multidetector CT imaging of the chest, abdomen and pelvis was performed following the standard protocol during bolus administration of intravenous contrast. CONTRAST:  177mL OMNIPAQUE IOHEXOL 300 MG/ML SOLN, 62mL OMNIPAQUE IOHEXOL 300 MG/ML SOLN COMPARISON:  05/05/2018 and 03/09/2018 FINDINGS: CT CHEST FINDINGS Cardiovascular: Heart is normal size. Mild calcified plaque over the left main and left anterior descending coronary arteries. Mild calcified plaque over the thoracic aorta. Minimal prominence of the pulmonary arteries which are otherwise unremarkable. Remaining vascular structures are unremarkable. Mediastinum/Nodes: No mediastinal or hilar adenopathy. Remaining mediastinal structures are within normal. No axillary adenopathy. Lungs/Pleura: Lungs are well inflated demonstrate diffuse moderate centrilobular emphysematous disease. No evidence of effusion. Linear scarring with areas of septal  thickening over the medial right upper lobe/apex with interval improvement. Small right middle lobe containing linear density with nodularity which is the site of patient's original lung cancer as this is stable. Interval improvement and patchy right lower lobe airspace process and posterior basilar atelectasis. New spiculated nodular density over the posterior right lower lobe measuring 1.5 x 1.8 cm with patchy peripheral heterogeneous density over the lateral right lower lobe. Linear scarring posterior left base. Left lung is otherwise unchanged and clear. Airways are unremarkable. Musculoskeletal: Unchanged. CT ABDOMEN PELVIS FINDINGS Hepatobiliary: Previous cholecystectomy. Liver and biliary tree are within normal. Stable prominence of the common bile duct post cholecystectomy. Pancreas: Normal. Spleen: Normal. Adrenals/Urinary Tract: Adrenal glands are normal. Kidneys normal in size without hydronephrosis or nephrolithiasis. Few small bilateral renal cysts unchanged. Ureters and bladder are normal. Stomach/Bowel: Stomach and small bowel are normal. Appendix is normal. Colon is unremarkable. Vascular/Lymphatic: Mild calcified plaque over the abdominal aorta. No adenopathy. Reproductive: Several uterine fibroids with 3.7 cm calcified left-sided uterine fibroid unchanged. Adnexal regions are within normal. Other: No free fluid or focal inflammatory change. Musculoskeletal: Degenerative change of the spine and hips. No focal lytic or sclerotic lesions. IMPRESSION: 1.  Stable linear density with nodularity over the right middle lobe compatible with site of patient's original lung cancer. Scarring medial right upper lobe/apex with less septal thickening. Interval improved heterogeneous airspace density over the right lower lobe and improved posterior basilar atelectasis. New irregular 1.5 x 1.8 cm focus of nodular opacification over the posterior right lower lobe as well as patchy peripheral opacification over the  lateral right lower lobe. Findings may be due to atypical infectious or inflammatory process as metastatic disease is possible. No mediastinal/hilar adenopathy. Recommend attention on follow-up. 2.  No evidence of metastatic disease within the abdomen/pelvis. 3. Aortic Atherosclerosis (ICD10-I70.0) and Emphysema (ICD10-J43.9). Mild atherosclerotic coronary artery disease. 4. Stable chronic findings within the abdomen and pelvis as described including several small bilateral renal cysts and leiomyomatous uterus. Electronically Signed   By: Marin Olp M.D.   On: 07/30/2018 10:45    ASSESSMENT AND PLAN:  This is a very pleasant 68 years old African-American female with a recurrent non-small cell lung cancer initially diagnosed as stage IIIA non-small cell lung cancer status post concurrent chemoradiation followed by consolidation chemotherapy with carboplatin and paclitaxel for 3 cycles. The patient was followed by observation but restaging imaging studies showed evidence for disease progression. She was started on second line treatment with immunotherapy with Nivolumab 480 mg IV every 4 weeks status post 16 cycles.   She continues to tolerate this treatment well with no concerning adverse effects. The patient had repeat CT scan of the chest, abdomen and pelvis performed recently.  I personally and independently reviewed the scans and discussed the results with the patient today. PET scan showed no concerning findings for disease progression but there was new nodule or investigation in the posterior right lower lobe concerning for inflammatory process. I recommended for the patient to continue with her current treatment with nivolumab and she will proceed with cycle #17 today. I will see her back for follow-up visit in 4 weeks for evaluation before the next cycle of her treatment. She was advised to call immediately if she has any concerning symptoms in the interval. The patient voices understanding of  current disease status and treatment options and is in agreement with the current care plan. All questions were answered. The patient knows to call the clinic with any problems, questions or concerns. We can certainly see the patient much sooner if necessary.  Disclaimer: This note was dictated with voice recognition software. Similar sounding words can inadvertently be transcribed and may not be corrected upon review.

## 2018-08-03 ENCOUNTER — Telehealth: Payer: Self-pay | Admitting: Internal Medicine

## 2018-08-03 NOTE — Telephone Encounter (Signed)
Tried to reach mailbox was full so mailed

## 2018-08-30 ENCOUNTER — Other Ambulatory Visit (HOSPITAL_COMMUNITY): Payer: Self-pay | Admitting: Internal Medicine

## 2018-08-30 DIAGNOSIS — Z1231 Encounter for screening mammogram for malignant neoplasm of breast: Secondary | ICD-10-CM

## 2018-09-01 ENCOUNTER — Encounter: Payer: Self-pay | Admitting: Internal Medicine

## 2018-09-01 ENCOUNTER — Inpatient Hospital Stay: Payer: Medicare HMO

## 2018-09-01 ENCOUNTER — Inpatient Hospital Stay: Payer: Medicare HMO | Attending: Oncology | Admitting: Internal Medicine

## 2018-09-01 ENCOUNTER — Other Ambulatory Visit: Payer: Self-pay

## 2018-09-01 ENCOUNTER — Telehealth: Payer: Self-pay | Admitting: Internal Medicine

## 2018-09-01 VITALS — BP 124/92 | HR 94 | Temp 98.8°F | Resp 18 | Ht 67.0 in | Wt 149.1 lb

## 2018-09-01 DIAGNOSIS — R12 Heartburn: Secondary | ICD-10-CM | POA: Diagnosis not present

## 2018-09-01 DIAGNOSIS — C342 Malignant neoplasm of middle lobe, bronchus or lung: Secondary | ICD-10-CM | POA: Diagnosis not present

## 2018-09-01 DIAGNOSIS — Z5112 Encounter for antineoplastic immunotherapy: Secondary | ICD-10-CM | POA: Diagnosis not present

## 2018-09-01 DIAGNOSIS — Z79899 Other long term (current) drug therapy: Secondary | ICD-10-CM | POA: Insufficient documentation

## 2018-09-01 DIAGNOSIS — C3491 Malignant neoplasm of unspecified part of right bronchus or lung: Secondary | ICD-10-CM

## 2018-09-01 DIAGNOSIS — I10 Essential (primary) hypertension: Secondary | ICD-10-CM

## 2018-09-01 LAB — CMP (CANCER CENTER ONLY)
ALT: 9 U/L (ref 0–44)
AST: 17 U/L (ref 15–41)
Albumin: 3.5 g/dL (ref 3.5–5.0)
Alkaline Phosphatase: 224 U/L — ABNORMAL HIGH (ref 38–126)
Anion gap: 10 (ref 5–15)
BUN: 18 mg/dL (ref 8–23)
CO2: 28 mmol/L (ref 22–32)
Calcium: 9.2 mg/dL (ref 8.9–10.3)
Chloride: 106 mmol/L (ref 98–111)
Creatinine: 0.72 mg/dL (ref 0.44–1.00)
GFR, Est AFR Am: >60 mL/min (ref >60)
GFR, Estimated: >60 mL/min (ref >60)
Glucose, Bld: 70 mg/dL (ref 70–99)
Potassium: 4.1 mmol/L (ref 3.5–5.1)
Sodium: 144 mmol/L (ref 135–145)
Total Bilirubin: <0.2 mg/dL — ABNORMAL LOW (ref 0.3–1.2)
Total Protein: 7 g/dL (ref 6.5–8.1)

## 2018-09-01 LAB — CBC WITH DIFFERENTIAL (CANCER CENTER ONLY)
Abs Immature Granulocytes: 0.03 10*3/uL (ref 0.00–0.07)
Basophils Absolute: 0 10*3/uL (ref 0.0–0.1)
Basophils Relative: 1 %
Eosinophils Absolute: 0.1 10*3/uL (ref 0.0–0.5)
Eosinophils Relative: 2 %
HCT: 39.8 % (ref 36.0–46.0)
Hemoglobin: 13.1 g/dL (ref 12.0–15.0)
Immature Granulocytes: 1 %
Lymphocytes Relative: 24 %
Lymphs Abs: 1.3 10*3/uL (ref 0.7–4.0)
MCH: 28 pg (ref 26.0–34.0)
MCHC: 32.9 g/dL (ref 30.0–36.0)
MCV: 85 fL (ref 80.0–100.0)
Monocytes Absolute: 0.4 10*3/uL (ref 0.1–1.0)
Monocytes Relative: 8 %
Neutro Abs: 3.4 10*3/uL (ref 1.7–7.7)
Neutrophils Relative %: 64 %
Platelet Count: 253 10*3/uL (ref 150–400)
RBC: 4.68 MIL/uL (ref 3.87–5.11)
RDW: 16.2 % — ABNORMAL HIGH (ref 11.5–15.5)
WBC Count: 5.3 10*3/uL (ref 4.0–10.5)
nRBC: 0 % (ref 0.0–0.2)

## 2018-09-01 LAB — TSH: TSH: 2.507 u[IU]/mL (ref 0.308–3.960)

## 2018-09-01 MED ORDER — SODIUM CHLORIDE 0.9 % IV SOLN
Freq: Once | INTRAVENOUS | Status: AC
  Administered 2018-09-01: 10:00:00 via INTRAVENOUS
  Filled 2018-09-01: qty 250

## 2018-09-01 MED ORDER — SODIUM CHLORIDE 0.9 % IV SOLN
480.0000 mg | Freq: Once | INTRAVENOUS | Status: AC
  Administered 2018-09-01: 480 mg via INTRAVENOUS
  Filled 2018-09-01: qty 48

## 2018-09-01 NOTE — Telephone Encounter (Signed)
Scheduled appt per 5/20 los - pt to get an updated schedule next visit.

## 2018-09-01 NOTE — Patient Instructions (Signed)
[facility] Discharge Instructions for Patients Receiving Chemotherapy  Today you received the following chemotherapy agents Opdivo  To help prevent nausea and vomiting after your treatment, we encourage you to take your nausea medication as directed   If you develop nausea and vomiting that is not controlled by your nausea medication, call the clinic.   BELOW ARE SYMPTOMS THAT SHOULD BE REPORTED IMMEDIATELY:  *FEVER GREATER THAN 100.5 F  *CHILLS WITH OR WITHOUT FEVER  NAUSEA AND VOMITING THAT IS NOT CONTROLLED WITH YOUR NAUSEA MEDICATION  *UNUSUAL SHORTNESS OF BREATH  *UNUSUAL BRUISING OR BLEEDING  TENDERNESS IN MOUTH AND THROAT WITH OR WITHOUT PRESENCE OF ULCERS  *URINARY PROBLEMS  *BOWEL PROBLEMS  UNUSUAL RASH Items with * indicate a potential emergency and should be followed up as soon as possible.  Feel free to call the clinic should you have any questions or concerns. The clinic phone number is [phone].  Please show the CHEMO ALERT CARD at check-in to the Emergency Department and triage nurse.   

## 2018-09-01 NOTE — Progress Notes (Signed)
[place] [phone]   [phone]  OFFICE PROGRESS NOTE  Barry Dienes, NP No address on file  DIAGNOSIS: Recurrent non-small cell lung cancer initially diagnosed as stage IIIA (T1b, N2, M0) non-small cell lung cancer presented with right middle lobe pulmonary nodule, mediastinal lymphadenopathy and highly suspicious for small nodule in the left upper lobe that could change her stage to stage IV that could present another synchronous primary lesion in the left upper lobe. This was diagnosed in September 2017.  PRIOR THERAPY:  1) Concurrent chemoradiation with weekly carboplatin for AUC of 2 and paclitaxel 45 MG/M2 status post 6 cycles last dose was given 02/25/2016 with partial response. 2) Consolidation chemotherapy with carboplatin for AUC of 5 and paclitaxel 175 MG/M2 every 3 weeks with Neulasta support. First dose 04/22/2016. Status post 3 cycles.  CURRENT THERAPY: Second line treatment with immunotherapy with Nivolumab 480 mg IV every 4 weeks status post 17 cycles..  INTERVAL HISTORY: Courtney Zuniga 68 y.o. female returns to the clinic today for follow-up visit.  The patient is feeling fine today with no concerning complaints except for heartburn after she was restarted by her primary care physician on a short course of prednisone.  She was taken Prilosec with no improvement.  She denied having any other concerning complaints.  She has no nausea, vomiting, diarrhea or constipation.  She denied having any headache or visual changes.  She denied having any weight loss or night sweats.  She has no fever or chills.  The patient denied having any chest pain, shortness of breath, cough or hemoptysis.  She is here today for evaluation before starting cycle #18.  MEDICAL HISTORY: Past Medical History:  Diagnosis Date  . Anemia    as a young woman  . Arthritis    osteoartritis  . Asthma   . Brain tumor (benign) ([place]) 2005 [facility]   Benign  . Chronic  headaches   . Chronic hip pain   . Chronic pain   . COPD (chronic obstructive pulmonary disease) ([place])   . Coronary artery disease   . Depression   . Depression 05/15/2016  . Encounter for antineoplastic chemotherapy 01/10/2016  . GERD (gastroesophageal reflux disease)   . Hypertension   . Lung cancer ([place]) dx'd 01/2016   currently on chemo and radiation   . NSTEMI (non-ST elevated myocardial infarction) ([place]) yrs ago  . On home O2    qhs 2 liters at hs and prn  . Pneumonia last time 2 yrs ago  . Shortness of breath dyspnea    with activity    ALLERGIES:  has No Known Allergies.  MEDICATIONS:  Current Outpatient Medications  Medication Sig Dispense Refill  . acetaminophen (TYLENOL) 325 MG tablet Take 2 tablets (650 mg total) by mouth every 6 (six) hours as needed for mild pain, fever or headache. 12 tablet 2  . albuterol (PROVENTIL HFA;VENTOLIN HFA) 108 (90 BASE) MCG/ACT inhaler Inhale 2 puffs into the lungs every 4 (four) hours as needed for shortness of breath.     Marland Kitchen BREO ELLIPTA 100-25 MCG/INH AEPB Inhale 1 puff into the lungs daily.     . Cholecalciferol (VITAMIN D3) 125 MCG (5000 UT) CAPS Take 1 capsule by mouth daily.  0  . cyclobenzaprine (FLEXERIL) 10 MG tablet Take 1 tablet (10 mg total) by mouth 2 (two) times daily. *MAy take one additional tablet as needed for muscle spasms 60 tablet 2  . dextromethorphan-guaiFENesin (MUCINEX DM) 30-600 MG  12hr tablet Take 1 tablet by mouth 2 (two) times daily. 40 tablet 0  . diclofenac sodium (VOLTAREN) 1 % GEL Apply topically 4 (four) times daily as needed (massge gel into affected area(s) as needed for pain).   1  . ENSURE (ENSURE) Take 237 mLs by mouth 3 (three) times daily between meals.    Marland Kitchen ipratropium-albuterol (DUONEB) 0.5-2.5 (3) MG/3ML SOLN Take 3 mLs by nebulization 3 (three) times daily. 360 mL 2  . metoprolol tartrate (LOPRESSOR) 25 MG tablet Take 1 tablet (25 mg total) by mouth 2 (two) times daily. 60 tablet 1  . nicotine  (NICODERM CQ - DOSED IN MG/24 HOURS) 14 mg/24hr patch Place 1 patch (14 mg total) onto the skin daily. 28 patch 0  . oseltamivir (TAMIFLU) 75 MG capsule Take 1 capsule (75 mg total) by mouth 2 (two) times daily. 6 capsule 0  . oxyCODONE-acetaminophen (PERCOCET/ROXICET) 5-325 MG tablet Take 1 tablet by mouth every 8 (eight) hours as needed for moderate pain. 12 tablet 0  . pantoprazole (PROTONIX) 40 MG tablet Take 1 tablet (40 mg total) by mouth daily. 30 tablet 3  . predniSONE (DELTASONE) 20 MG tablet Take 3 tablets by mouth daily X 1 day; then 2 tablets by mouth daily X 2 days; then 1 tablet by mouth daily X 2 days; then 1/2 tablet by mouth daily X 3 days and stop prednisone. 12 tablet 0  . ROBITUSSIN 12 HOUR COUGH 30 MG/5ML liquid Take 30 mg by mouth every 4 (four) hours as needed for cough.     . topiramate (TOPAMAX) 25 MG tablet Take 1 tablet by mouth daily at bedtime X 1 week; then increase to 25mg  BID. 45 tablet 2   No current facility-administered medications for this visit.     SURGICAL HISTORY:  Past Surgical History:  Procedure Laterality Date  . CHOLECYSTECTOMY    . COLONOSCOPY  2015   [facility] from [facility]  . COLONOSCOPY    . ESOPHAGOGASTRODUODENOSCOPY N/A 08/14/2015   Procedure: ESOPHAGOGASTRODUODENOSCOPY (EGD);  Surgeon: Daneil Dolin, MD;  [facility];  Service: Endoscopy;  Laterality: N/A;  215   . ESOPHAGOGASTRODUODENOSCOPY (EGD) WITH PROPOFOL N/A 09/13/2015   Procedure: ESOPHAGOGASTRODUODENOSCOPY (EGD) WITH PROPOFOL;  Surgeon: Milus Banister, MD;  [facility];  Service: Endoscopy;  Laterality: N/A;  . EUS N/A 03/12/2017   Procedure: UPPER ENDOSCOPIC ULTRASOUND (EUS) RADIAL;  Surgeon: Milus Banister, MD;  [facility];  Service: Endoscopy;  Laterality: N/A;  . TUMOR REMOVAL  2005   Benign  . UPPER ESOPHAGEAL ENDOSCOPIC ULTRASOUND (EUS)  09/13/2015   Procedure: UPPER ESOPHAGEAL ENDOSCOPIC ULTRASOUND (EUS);  Surgeon:  Milus Banister, MD;  Location: [facility];  Service: Endoscopy;;  . VIDEO BRONCHOSCOPY WITH ENDOBRONCHIAL NAVIGATION N/A 12/31/2015   Procedure: VIDEO BRONCHOSCOPY WITH ENDOBRONCHIAL NAVIGATION;  Surgeon: Melrose Nakayama, MD;  Location: [facility];  Service: Thoracic;  Laterality: N/A;  . VIDEO BRONCHOSCOPY WITH ENDOBRONCHIAL ULTRASOUND N/A 11/08/2015   Procedure: VIDEO BRONCHOSCOPY WITH ENDOBRONCHIAL ULTRASOUND;  Surgeon: Ivin Poot, MD;  [facility];  Service: Thoracic;  Laterality: N/A;    REVIEW OF SYSTEMS:  A comprehensive review of systems was negative except for: Gastrointestinal: positive for dyspepsia   PHYSICAL EXAMINATION: General appearance: alert, cooperative and no distress Head: Normocephalic, without obvious abnormality, atraumatic Neck: no adenopathy, no JVD, supple, symmetrical, trachea midline and thyroid not enlarged, symmetric, no tenderness/mass/nodules Lymph nodes: Cervical, supraclavicular, and axillary nodes normal. Resp: clear to  auscultation bilaterally Back: symmetric, no curvature. ROM normal. No CVA tenderness. Cardio: regular rate and rhythm, S1, S2 normal, no murmur, click, rub or gallop GI: soft, non-tender; bowel sounds normal; no masses,  no organomegaly Extremities: extremities normal, atraumatic, no cyanosis or edema  ECOG PERFORMANCE STATUS: 1 - Symptomatic but completely ambulatory  Blood pressure (!) 124/92, pulse 94, temperature 98.8 F (37.1 C), temperature source Oral, resp. rate 18, height 5\' 7"  (1.702 m), weight 149 lb 1.6 oz (67.6 kg), SpO2 100 %.  LABORATORY DATA: Lab Results  Component Value Date   WBC 5.3 09/01/2018   HGB 13.1 09/01/2018   HCT 39.8 09/01/2018   MCV 85.0 09/01/2018   PLT 253 09/01/2018      Chemistry      Component Value Date/Time   NA 143 08/02/2018 0917   NA 144 04/09/2017 0948   K 3.8 08/02/2018 0917   K 4.3 04/09/2017 0948   CL 109 08/02/2018 0917   CO2 23 08/02/2018 0917   CO2 25 04/09/2017 0948    BUN 15 08/02/2018 0917   BUN 16.8 04/09/2017 0948   CREATININE 0.75 08/02/2018 0917   CREATININE 0.8 04/09/2017 0948      Component Value Date/Time   CALCIUM 9.0 08/02/2018 0917   CALCIUM 9.1 04/09/2017 0948   ALKPHOS 243 (H) 08/02/2018 0917   ALKPHOS 216 (H) 04/09/2017 0948   AST 15 08/02/2018 0917   AST 12 04/09/2017 0948   ALT <6 08/02/2018 0917   ALT 13 04/09/2017 0948   BILITOT <0.2 (L) 08/02/2018 0917   BILITOT 0.22 04/09/2017 0948       RADIOGRAPHIC STUDIES:-   No results found.  ASSESSMENT AND PLAN:  This is a very pleasant 68 years old African-American female with a recurrent non-small cell lung cancer initially diagnosed as stage IIIA non-small cell lung cancer status post concurrent chemoradiation followed by consolidation chemotherapy with carboplatin and paclitaxel for 3 cycles. The patient was followed by observation but restaging imaging studies showed evidence for disease progression. She was started on second line treatment with immunotherapy with Nivolumab 480 mg IV every 4 weeks status post 17 cycles.   The patient continues to tolerate this treatment well with no adverse effects. Recommended for her to proceed with cycle #18 today as planned. I will see her back for follow-up visit in 4 weeks for evaluation before the next cycle of her treatment. She was advised to call immediately if she has any concerning symptoms in the interval. The patient voices understanding of current disease status and treatment options and is in agreement with the current care plan. All questions were answered. The patient knows to call the clinic with any problems, questions or concerns. We can certainly see the patient much sooner if necessary.  Disclaimer: This note was dictated with voice recognition software. Similar sounding words can inadvertently be transcribed and may not be corrected upon review.

## 2018-09-27 ENCOUNTER — Inpatient Hospital Stay (HOSPITAL_BASED_OUTPATIENT_CLINIC_OR_DEPARTMENT_OTHER): Payer: Medicare HMO | Admitting: Internal Medicine

## 2018-09-27 ENCOUNTER — Inpatient Hospital Stay: Payer: Medicare HMO

## 2018-09-27 ENCOUNTER — Inpatient Hospital Stay: Payer: Medicare HMO | Attending: Oncology

## 2018-09-27 ENCOUNTER — Other Ambulatory Visit: Payer: Self-pay

## 2018-09-27 ENCOUNTER — Encounter: Payer: Self-pay | Admitting: Internal Medicine

## 2018-09-27 ENCOUNTER — Telehealth: Payer: Self-pay | Admitting: Internal Medicine

## 2018-09-27 VITALS — BP 134/75 | HR 95 | Temp 98.0°F | Resp 18 | Ht 67.0 in | Wt 141.9 lb

## 2018-09-27 DIAGNOSIS — R12 Heartburn: Secondary | ICD-10-CM

## 2018-09-27 DIAGNOSIS — C3490 Malignant neoplasm of unspecified part of unspecified bronchus or lung: Secondary | ICD-10-CM

## 2018-09-27 DIAGNOSIS — Z5112 Encounter for antineoplastic immunotherapy: Secondary | ICD-10-CM | POA: Insufficient documentation

## 2018-09-27 DIAGNOSIS — C342 Malignant neoplasm of middle lobe, bronchus or lung: Secondary | ICD-10-CM

## 2018-09-27 DIAGNOSIS — Z79899 Other long term (current) drug therapy: Secondary | ICD-10-CM | POA: Insufficient documentation

## 2018-09-27 DIAGNOSIS — C3491 Malignant neoplasm of unspecified part of right bronchus or lung: Secondary | ICD-10-CM

## 2018-09-27 DIAGNOSIS — I10 Essential (primary) hypertension: Secondary | ICD-10-CM

## 2018-09-27 LAB — CBC WITH DIFFERENTIAL (CANCER CENTER ONLY)
Abs Immature Granulocytes: 0.01 10*3/uL (ref 0.00–0.07)
Basophils Absolute: 0 10*3/uL (ref 0.0–0.1)
Basophils Relative: 1 %
Eosinophils Absolute: 0.1 10*3/uL (ref 0.0–0.5)
Eosinophils Relative: 2 %
HCT: 42.4 % (ref 36.0–46.0)
Hemoglobin: 13.8 g/dL (ref 12.0–15.0)
Immature Granulocytes: 0 %
Lymphocytes Relative: 23 %
Lymphs Abs: 1.3 10*3/uL (ref 0.7–4.0)
MCH: 27.7 pg (ref 26.0–34.0)
MCHC: 32.5 g/dL (ref 30.0–36.0)
MCV: 85.1 fL (ref 80.0–100.0)
Monocytes Absolute: 0.4 10*3/uL (ref 0.1–1.0)
Monocytes Relative: 7 %
Neutro Abs: 3.8 10*3/uL (ref 1.7–7.7)
Neutrophils Relative %: 67 %
Platelet Count: 268 10*3/uL (ref 150–400)
RBC: 4.98 MIL/uL (ref 3.87–5.11)
RDW: 16 % — ABNORMAL HIGH (ref 11.5–15.5)
WBC Count: 5.7 10*3/uL (ref 4.0–10.5)
nRBC: 0 % (ref 0.0–0.2)

## 2018-09-27 LAB — CMP (CANCER CENTER ONLY)
ALT: 10 U/L (ref 0–44)
AST: 16 U/L (ref 15–41)
Albumin: 3.7 g/dL (ref 3.5–5.0)
Alkaline Phosphatase: 222 U/L — ABNORMAL HIGH (ref 38–126)
Anion gap: 11 (ref 5–15)
BUN: 15 mg/dL (ref 8–23)
CO2: 27 mmol/L (ref 22–32)
Calcium: 9.9 mg/dL (ref 8.9–10.3)
Chloride: 106 mmol/L (ref 98–111)
Creatinine: 0.69 mg/dL (ref 0.44–1.00)
GFR, Est AFR Am: >60 mL/min (ref >60)
GFR, Estimated: >60 mL/min (ref >60)
Glucose, Bld: 75 mg/dL (ref 70–99)
Potassium: 4 mmol/L (ref 3.5–5.1)
Sodium: 144 mmol/L (ref 135–145)
Total Bilirubin: <0.2 mg/dL — ABNORMAL LOW (ref 0.3–1.2)
Total Protein: 7.4 g/dL (ref 6.5–8.1)

## 2018-09-27 LAB — TSH: TSH: 2.66 u[IU]/mL (ref 0.308–3.960)

## 2018-09-27 MED ORDER — SODIUM CHLORIDE 0.9 % IV SOLN
Freq: Once | INTRAVENOUS | Status: AC
  Administered 2018-09-27: 11:00:00 via INTRAVENOUS
  Filled 2018-09-27: qty 250

## 2018-09-27 MED ORDER — SODIUM CHLORIDE 0.9 % IV SOLN
480.0000 mg | Freq: Once | INTRAVENOUS | Status: AC
  Administered 2018-09-27: 12:00:00 480 mg via INTRAVENOUS
  Filled 2018-09-27: qty 48

## 2018-09-27 NOTE — Progress Notes (Signed)
[place] [phone]   Fax:(336) [phone]  OFFICE PROGRESS NOTE  Barry Dienes, NP No address on file  DIAGNOSIS: Recurrent non-small cell lung cancer initially diagnosed as stage IIIA (T1b, N2, M0) non-small cell lung cancer presented with right middle lobe pulmonary nodule, mediastinal lymphadenopathy and highly suspicious for small nodule in the left upper lobe that could change her stage to stage IV that could present another synchronous primary lesion in the left upper lobe. This was diagnosed in September 2017.  PRIOR THERAPY:  1) Concurrent chemoradiation with weekly carboplatin for AUC of 2 and paclitaxel 45 MG/M2 status post 6 cycles last dose was given 02/25/2016 with partial response. 2) Consolidation chemotherapy with carboplatin for AUC of 5 and paclitaxel 175 MG/M2 every 3 weeks with Neulasta support. First dose 04/22/2016. Status post 3 cycles.  CURRENT THERAPY: Second line treatment with immunotherapy with Nivolumab 480 mg IV every 4 weeks status post 18 cycles..  INTERVAL HISTORY: Courtney Zuniga 68 y.o. female returns to the clinic today for follow-up visit.  The patient is feeling fine today with no concerning complaints except for heartburn.  She takes Rolaids over-the-counter with no improvement.  She was given prescription for Protonix by her primary care physician but the patient does not take it at regular basis.  She denied having any chest pain, shortness of breath, cough or hemoptysis.  She denied having any fever or chills.  She has no nausea, vomiting, diarrhea or constipation.  She is here today for evaluation before starting cycle #19 of her treatment.  MEDICAL HISTORY: Past Medical History:  Diagnosis Date   Anemia    as a young woman   Arthritis    osteoartritis   Asthma    Brain tumor (benign) ([place]) 2005 [facility]   Benign   Chronic headaches    Chronic hip pain    Chronic pain    COPD (chronic obstructive  pulmonary disease) ([facility])    Coronary artery disease    Depression    Depression 05/15/2016   Encounter for antineoplastic chemotherapy 01/10/2016   GERD (gastroesophageal reflux disease)    Hypertension    Lung cancer ([place]) dx'd 01/2016   currently on chemo and radiation    NSTEMI (non-ST elevated myocardial infarction) ([place]) yrs ago   On home O2    qhs 2 liters at hs and prn   Pneumonia last time 2 yrs ago   Shortness of breath dyspnea    with activity    ALLERGIES:  has No Known Allergies.  MEDICATIONS:  Current Outpatient Medications  Medication Sig Dispense Refill   acetaminophen (TYLENOL) 325 MG tablet Take 2 tablets (650 mg total) by mouth every 6 (six) hours as needed for mild pain, fever or headache. 12 tablet 2   albuterol (PROVENTIL HFA;VENTOLIN HFA) 108 (90 BASE) MCG/ACT inhaler Inhale 2 puffs into the lungs every 4 (four) hours as needed for shortness of breath.      BREO ELLIPTA 100-25 MCG/INH AEPB Inhale 1 puff into the lungs daily.      Cholecalciferol (VITAMIN D3) 125 MCG (5000 UT) CAPS Take 1 capsule by mouth daily.  0   cyclobenzaprine (FLEXERIL) 10 MG tablet Take 1 tablet (10 mg total) by mouth 2 (two) times daily. *MAy take one additional tablet as needed for muscle spasms 60 tablet 2   dextromethorphan-guaiFENesin (MUCINEX DM) 30-600 MG 12hr tablet Take 1 tablet by mouth 2 (two) times daily. 40 tablet 0   diclofenac  sodium (VOLTAREN) 1 % GEL Apply topically 4 (four) times daily as needed (massge gel into affected area(s) as needed for pain).   1   ENSURE (ENSURE) Take 237 mLs by mouth 3 (three) times daily between meals.     ipratropium-albuterol (DUONEB) 0.5-2.5 (3) MG/3ML SOLN Take 3 mLs by nebulization 3 (three) times daily. 360 mL 2   metoprolol tartrate (LOPRESSOR) 25 MG tablet Take 1 tablet (25 mg total) by mouth 2 (two) times daily. 60 tablet 1   nicotine (NICODERM CQ - DOSED IN MG/24 HOURS) 14 mg/24hr patch Place 1 patch (14 mg total)  onto the skin daily. 28 patch 0   oseltamivir (TAMIFLU) 75 MG capsule Take 1 capsule (75 mg total) by mouth 2 (two) times daily. 6 capsule 0   oxyCODONE-acetaminophen (PERCOCET/ROXICET) 5-325 MG tablet Take 1 tablet by mouth every 8 (eight) hours as needed for moderate pain. 12 tablet 0   pantoprazole (PROTONIX) 40 MG tablet Take 1 tablet (40 mg total) by mouth daily. 30 tablet 3   predniSONE (DELTASONE) 20 MG tablet Take 3 tablets by mouth daily X 1 day; then 2 tablets by mouth daily X 2 days; then 1 tablet by mouth daily X 2 days; then 1/2 tablet by mouth daily X 3 days and stop prednisone. 12 tablet 0   ROBITUSSIN 12 HOUR COUGH 30 MG/5ML liquid Take 30 mg by mouth every 4 (four) hours as needed for cough.      topiramate (TOPAMAX) 25 MG tablet Take 1 tablet by mouth daily at bedtime X 1 week; then increase to 25mg  BID. 45 tablet 2   No current facility-administered medications for this visit.     SURGICAL HISTORY:  Past Surgical History:  Procedure Laterality Date   CHOLECYSTECTOMY     COLONOSCOPY  2015   [facility]   COLONOSCOPY     ESOPHAGOGASTRODUODENOSCOPY N/A 08/14/2015   Procedure: ESOPHAGOGASTRODUODENOSCOPY (EGD);  Surgeon: Daneil Dolin, MD;  [facility];  Service: Endoscopy;  Laterality: N/A;  215    ESOPHAGOGASTRODUODENOSCOPY (EGD) WITH PROPOFOL N/A 09/13/2015   Procedure: ESOPHAGOGASTRODUODENOSCOPY (EGD) WITH PROPOFOL;  Surgeon: Milus Banister, MD;  [facility];  Service: Endoscopy;  Laterality: N/A;   EUS N/A 03/12/2017   Procedure: UPPER ENDOSCOPIC ULTRASOUND (EUS) RADIAL;  Surgeon: Milus Banister, MD;  [facility];  Service: Endoscopy;  Laterality: N/A;   TUMOR REMOVAL  2005   Benign   UPPER ESOPHAGEAL ENDOSCOPIC ULTRASOUND (EUS)  09/13/2015   Procedure: UPPER ESOPHAGEAL ENDOSCOPIC ULTRASOUND (EUS);  Surgeon: Milus Banister, MD;  Location: [facility];  Service: Endoscopy;;   VIDEO  BRONCHOSCOPY WITH ENDOBRONCHIAL NAVIGATION N/A 12/31/2015   Procedure: VIDEO BRONCHOSCOPY WITH ENDOBRONCHIAL NAVIGATION;  Surgeon: Melrose Nakayama, MD;  Location: [facility];  Service: Thoracic;  Laterality: N/A;   VIDEO BRONCHOSCOPY WITH ENDOBRONCHIAL ULTRASOUND N/A 11/08/2015   Procedure: VIDEO BRONCHOSCOPY WITH ENDOBRONCHIAL ULTRASOUND;  Surgeon: Ivin Poot, MD;  [facility]: [facility];  Service: Thoracic;  Laterality: N/A;    REVIEW OF SYSTEMS:  A comprehensive review of systems was negative except for: Gastrointestinal: positive for dyspepsia   PHYSICAL EXAMINATION: General appearance: alert, cooperative and no distress Head: Normocephalic, without obvious abnormality, atraumatic Neck: no adenopathy, no JVD, supple, symmetrical, trachea midline and thyroid not enlarged, symmetric, no tenderness/mass/nodules Lymph nodes: Cervical, supraclavicular, and axillary nodes normal. Resp: clear to auscultation bilaterally Back: symmetric, no curvature. ROM normal. No CVA tenderness. Cardio: regular rate and rhythm, S1,  S2 normal, no murmur, click, rub or gallop GI: soft, non-tender; bowel sounds normal; no masses,  no organomegaly Extremities: extremities normal, atraumatic, no cyanosis or edema  ECOG PERFORMANCE STATUS: 1 - Symptomatic but completely ambulatory  Blood pressure 134/75, pulse 95, temperature 98 F (36.7 C), resp. rate 18, height 5\' 7"  (1.702 m), weight 141 lb 14.4 oz (64.4 kg), SpO2 95 %.  LABORATORY DATA: Lab Results  Component Value Date   WBC 5.3 09/01/2018   HGB 13.1 09/01/2018   HCT 39.8 09/01/2018   MCV 85.0 09/01/2018   PLT 253 09/01/2018      Chemistry      Component Value Date/Time   NA 144 09/01/2018 0820   NA 144 04/09/2017 0948   K 4.1 09/01/2018 0820   K 4.3 04/09/2017 0948   CL 106 09/01/2018 0820   CO2 28 09/01/2018 0820   CO2 25 04/09/2017 0948   BUN 18 09/01/2018 0820   BUN 16.8 04/09/2017 0948   CREATININE 0.72 09/01/2018 0820   CREATININE 0.8  04/09/2017 0948      Component Value Date/Time   CALCIUM 9.2 09/01/2018 0820   CALCIUM 9.1 04/09/2017 0948   ALKPHOS 224 (H) 09/01/2018 0820   ALKPHOS 216 (H) 04/09/2017 0948   AST 17 09/01/2018 0820   AST 12 04/09/2017 0948   ALT 9 09/01/2018 0820   ALT 13 04/09/2017 0948   BILITOT <0.2 (L) 09/01/2018 0820   BILITOT 0.22 04/09/2017 0948       RADIOGRAPHIC STUDIES:-   No results found.  ASSESSMENT AND PLAN:  This is a very pleasant 68 years old African-American female with a recurrent non-small cell lung cancer initially diagnosed as stage IIIA non-small cell lung cancer status post concurrent chemoradiation followed by consolidation chemotherapy with carboplatin and paclitaxel for 3 cycles. The patient was followed by observation but restaging imaging studies showed evidence for disease progression. She was started on second line treatment with immunotherapy with Nivolumab 480 mg IV every 4 weeks status post 18 cycles.   The patient continues to tolerate her treatment well with no concerning adverse effects. I recommended for her to proceed with cycle #19 today as planned. I will see her back for follow-up visit in 4 weeks for evaluation after repeating CT scan of the chest, abdomen and pelvis for restaging of her disease. The patient was advised to call immediately if she has any concerning symptoms in the interval. The patient voices understanding of current disease status and treatment options and is in agreement with the current care plan. All questions were answered. The patient knows to call the clinic with any problems, questions or concerns. We can certainly see the patient much sooner if necessary.  Disclaimer: This note was dictated with voice recognition software. Similar sounding words can inadvertently be transcribed and may not be corrected upon review.

## 2018-09-27 NOTE — Progress Notes (Signed)
Per patient she is no longer taking prednisone and has not been prescribed steroids since the steroid taper she received in March 2020. Medication list updated to reflect this.   Jalene Mullet, PharmD PGY2 Hematology/ Oncology Pharmacy Resident 09/27/2018 11:32 AM

## 2018-09-27 NOTE — Patient Instructions (Signed)
Cancer Center Discharge Instructions for Patients Receiving Chemotherapy  Today you received the following chemotherapy agents Opdivo  To help prevent nausea and vomiting after your treatment, we encourage you to take your nausea medication as directed   If you develop nausea and vomiting that is not controlled by your nausea medication, call the clinic.   BELOW ARE SYMPTOMS THAT SHOULD BE REPORTED IMMEDIATELY:  *FEVER GREATER THAN 100.5 F  *CHILLS WITH OR WITHOUT FEVER  NAUSEA AND VOMITING THAT IS NOT CONTROLLED WITH YOUR NAUSEA MEDICATION  *UNUSUAL SHORTNESS OF BREATH  *UNUSUAL BRUISING OR BLEEDING  TENDERNESS IN MOUTH AND THROAT WITH OR WITHOUT PRESENCE OF ULCERS  *URINARY PROBLEMS  *BOWEL PROBLEMS  UNUSUAL RASH Items with * indicate a potential emergency and should be followed up as soon as possible.  Feel free to call the clinic should you have any questions or concerns. The clinic phone number is [phone].  Please show the CHEMO ALERT CARD at check-in to the Emergency Department and triage nurse.   

## 2018-09-27 NOTE — Telephone Encounter (Signed)
Scheduled appt per 6/15 los - pt to get an updated schedule next visit.

## 2018-10-13 ENCOUNTER — Ambulatory Visit (HOSPITAL_COMMUNITY)
Admission: RE | Admit: 2018-10-13 | Discharge: 2018-10-13 | Disposition: A | Discharge status: Home / Self Care | Payer: Medicare HMO | Source: Ambulatory Visit | Attending: Internal Medicine | Admitting: Internal Medicine

## 2018-10-13 ENCOUNTER — Other Ambulatory Visit: Payer: Self-pay

## 2018-10-13 ENCOUNTER — Encounter (HOSPITAL_COMMUNITY): Payer: Self-pay

## 2018-10-13 DIAGNOSIS — Z1231 Encounter for screening mammogram for malignant neoplasm of breast: Secondary | ICD-10-CM | POA: Insufficient documentation

## 2018-10-14 ENCOUNTER — Other Ambulatory Visit (HOSPITAL_COMMUNITY): Payer: Self-pay | Admitting: Internal Medicine

## 2018-10-14 DIAGNOSIS — R928 Other abnormal and inconclusive findings on diagnostic imaging of breast: Secondary | ICD-10-CM

## 2018-10-22 ENCOUNTER — Encounter (HOSPITAL_COMMUNITY): Payer: Self-pay

## 2018-10-22 ENCOUNTER — Ambulatory Visit (HOSPITAL_COMMUNITY)
Admission: RE | Admit: 2018-10-22 | Discharge: 2018-10-22 | Disposition: A | Discharge status: Home / Self Care | Payer: Medicare HMO | Source: Ambulatory Visit | Attending: Internal Medicine | Admitting: Internal Medicine

## 2018-10-22 ENCOUNTER — Other Ambulatory Visit: Payer: Self-pay

## 2018-10-22 DIAGNOSIS — C3490 Malignant neoplasm of unspecified part of unspecified bronchus or lung: Secondary | ICD-10-CM | POA: Insufficient documentation

## 2018-10-22 MED ORDER — SODIUM CHLORIDE (PF) 0.9 % IJ SOLN
INTRAMUSCULAR | Status: AC
  Filled 2018-10-22: qty 50

## 2018-10-22 MED ORDER — IOHEXOL 300 MG/ML  SOLN
100.0000 mL | Freq: Once | INTRAMUSCULAR | Status: AC | PRN
  Administered 2018-10-22: 100 mL via INTRAVENOUS

## 2018-10-25 ENCOUNTER — Inpatient Hospital Stay: Payer: Medicare HMO

## 2018-10-25 ENCOUNTER — Encounter: Payer: Self-pay | Admitting: Internal Medicine

## 2018-10-25 ENCOUNTER — Other Ambulatory Visit: Payer: Self-pay

## 2018-10-25 ENCOUNTER — Inpatient Hospital Stay: Payer: Medicare HMO | Attending: Oncology

## 2018-10-25 ENCOUNTER — Inpatient Hospital Stay (HOSPITAL_BASED_OUTPATIENT_CLINIC_OR_DEPARTMENT_OTHER): Payer: Medicare HMO | Admitting: Internal Medicine

## 2018-10-25 VITALS — BP 130/81 | HR 93 | Temp 98.5°F | Resp 21 | Ht 67.0 in | Wt 139.4 lb

## 2018-10-25 DIAGNOSIS — I10 Essential (primary) hypertension: Secondary | ICD-10-CM

## 2018-10-25 DIAGNOSIS — Z5112 Encounter for antineoplastic immunotherapy: Secondary | ICD-10-CM | POA: Insufficient documentation

## 2018-10-25 DIAGNOSIS — J441 Chronic obstructive pulmonary disease with (acute) exacerbation: Secondary | ICD-10-CM

## 2018-10-25 DIAGNOSIS — R0609 Other forms of dyspnea: Secondary | ICD-10-CM | POA: Diagnosis not present

## 2018-10-25 DIAGNOSIS — C342 Malignant neoplasm of middle lobe, bronchus or lung: Secondary | ICD-10-CM | POA: Insufficient documentation

## 2018-10-25 DIAGNOSIS — R12 Heartburn: Secondary | ICD-10-CM | POA: Diagnosis not present

## 2018-10-25 DIAGNOSIS — C3491 Malignant neoplasm of unspecified part of right bronchus or lung: Secondary | ICD-10-CM

## 2018-10-25 DIAGNOSIS — R079 Chest pain, unspecified: Secondary | ICD-10-CM

## 2018-10-25 DIAGNOSIS — Z79899 Other long term (current) drug therapy: Secondary | ICD-10-CM | POA: Diagnosis not present

## 2018-10-25 DIAGNOSIS — R5383 Other fatigue: Secondary | ICD-10-CM

## 2018-10-25 LAB — CBC WITH DIFFERENTIAL (CANCER CENTER ONLY)
Abs Immature Granulocytes: 0.01 10*3/uL (ref 0.00–0.07)
Basophils Absolute: 0 10*3/uL (ref 0.0–0.1)
Basophils Relative: 0 %
Eosinophils Absolute: 0.1 10*3/uL (ref 0.0–0.5)
Eosinophils Relative: 2 %
HCT: 41.3 % (ref 36.0–46.0)
Hemoglobin: 13.6 g/dL (ref 12.0–15.0)
Immature Granulocytes: 0 %
Lymphocytes Relative: 26 %
Lymphs Abs: 1.2 10*3/uL (ref 0.7–4.0)
MCH: 27.5 pg (ref 26.0–34.0)
MCHC: 32.9 g/dL (ref 30.0–36.0)
MCV: 83.4 fL (ref 80.0–100.0)
Monocytes Absolute: 0.3 10*3/uL (ref 0.1–1.0)
Monocytes Relative: 7 %
Neutro Abs: 2.9 10*3/uL (ref 1.7–7.7)
Neutrophils Relative %: 65 %
Platelet Count: 227 10*3/uL (ref 150–400)
RBC: 4.95 MIL/uL (ref 3.87–5.11)
RDW: 16 % — ABNORMAL HIGH (ref 11.5–15.5)
WBC Count: 4.6 10*3/uL (ref 4.0–10.5)
nRBC: 0 % (ref 0.0–0.2)

## 2018-10-25 LAB — CMP (CANCER CENTER ONLY)
ALT: 8 U/L (ref 0–44)
AST: 17 U/L (ref 15–41)
Albumin: 3.7 g/dL (ref 3.5–5.0)
Alkaline Phosphatase: 243 U/L — ABNORMAL HIGH (ref 38–126)
Anion gap: 10 (ref 5–15)
BUN: 14 mg/dL (ref 8–23)
CO2: 26 mmol/L (ref 22–32)
Calcium: 9.4 mg/dL (ref 8.9–10.3)
Chloride: 107 mmol/L (ref 98–111)
Creatinine: 0.71 mg/dL (ref 0.44–1.00)
GFR, Est AFR Am: >60 mL/min (ref >60)
GFR, Estimated: >60 mL/min (ref >60)
Glucose, Bld: 65 mg/dL — ABNORMAL LOW (ref 70–99)
Potassium: 3.9 mmol/L (ref 3.5–5.1)
Sodium: 143 mmol/L (ref 135–145)
Total Bilirubin: <0.2 mg/dL — ABNORMAL LOW (ref 0.3–1.2)
Total Protein: 7.2 g/dL (ref 6.5–8.1)

## 2018-10-25 LAB — TSH: TSH: 3.976 u[IU]/mL — ABNORMAL HIGH (ref 0.308–3.960)

## 2018-10-25 MED ORDER — SODIUM CHLORIDE 0.9 % IV SOLN
Freq: Once | INTRAVENOUS | Status: AC
  Administered 2018-10-25: 10:00:00 via INTRAVENOUS
  Filled 2018-10-25: qty 250

## 2018-10-25 MED ORDER — SODIUM CHLORIDE 0.9 % IV SOLN
480.0000 mg | Freq: Once | INTRAVENOUS | Status: AC
  Administered 2018-10-25: 480 mg via INTRAVENOUS
  Filled 2018-10-25: qty 48

## 2018-10-25 MED ORDER — HEPARIN SOD (PORK) LOCK FLUSH 100 UNIT/ML IV SOLN
500.0000 [IU] | Freq: Once | INTRAVENOUS | Status: DC | PRN
  Filled 2018-10-25: qty 5

## 2018-10-25 MED ORDER — SODIUM CHLORIDE 0.9% FLUSH
10.0000 mL | INTRAVENOUS | Status: DC | PRN
  Filled 2018-10-25: qty 10

## 2018-10-25 NOTE — Patient Instructions (Signed)
[facility] Discharge Instructions for Patients Receiving Chemotherapy  Today you received the following chemotherapy agents: Nivolumab  To help prevent nausea and vomiting after your treatment, we encourage you to take your nausea medication as directed.    If you develop nausea and vomiting that is not controlled by your nausea medication, call the clinic.   BELOW ARE SYMPTOMS THAT SHOULD BE REPORTED IMMEDIATELY:  *FEVER GREATER THAN 100.5 F  *CHILLS WITH OR WITHOUT FEVER  NAUSEA AND VOMITING THAT IS NOT CONTROLLED WITH YOUR NAUSEA MEDICATION  *UNUSUAL SHORTNESS OF BREATH  *UNUSUAL BRUISING OR BLEEDING  TENDERNESS IN MOUTH AND THROAT WITH OR WITHOUT PRESENCE OF ULCERS  *URINARY PROBLEMS  *BOWEL PROBLEMS  UNUSUAL RASH Items with * indicate a potential emergency and should be followed up as soon as possible.  Feel free to call the clinic should you have any questions or concerns. The clinic phone number is [phone].  Please show the CHEMO ALERT CARD at check-in to the Emergency Department and triage nurse.   

## 2018-10-25 NOTE — Progress Notes (Signed)
[facility] [phone]) [phone]   [phone]) [phone]  OFFICE PROGRESS NOTE  Barry Dienes, NP No address on file  DIAGNOSIS: Recurrent non-small cell lung cancer initially diagnosed as stage IIIA (T1b, N2, M0) non-small cell lung cancer presented with right middle lobe pulmonary nodule, mediastinal lymphadenopathy and highly suspicious for small nodule in the left upper lobe that could change her stage to stage IV that could present another synchronous primary lesion in the left upper lobe. This was diagnosed in September 2017.  PRIOR THERAPY:  1) Concurrent chemoradiation with weekly carboplatin for AUC of 2 and paclitaxel 45 MG/M2 status post 6 cycles last dose was given 02/25/2016 with partial response. 2) Consolidation chemotherapy with carboplatin for AUC of 5 and paclitaxel 175 MG/M2 every 3 weeks with Neulasta support. First dose 04/22/2016. Status post 3 cycles.  CURRENT THERAPY: Second line treatment with immunotherapy with Nivolumab 480 mg IV every 4 weeks status post 19 cycles..  INTERVAL HISTORY: Courtney Zuniga 68 y.o. female returns to the clinic today for follow-up visit.  The patient is feeling fine today with no concerning complaints except for mild fatigue and intermittent chest pain.  She also has heartburn and she is currently on Protonix daily.  She denied having any shortness of breath except with exertion with no cough or hemoptysis.  She denied having any recent weight loss or night sweats.  She has no nausea, vomiting, diarrhea or constipation.  She has no headache or visual changes.  She recently had diagnostic mammogram and she is required to have further evaluation because of possible asymmetry in the left breast.  She is scheduled for ultrasound of the left breast tomorrow.  The patient also had repeat CT scan of the chest, abdomen pelvis performed recently and she is here for evaluation and discussion of her scan results.  MEDICAL HISTORY: Past  Medical History:  Diagnosis Date   Anemia    as a young woman   Arthritis    osteoartritis   Asthma    Brain tumor (benign) ([place]) 2005 [facility]   Benign   Chronic headaches    Chronic hip pain    Chronic pain    COPD (chronic obstructive pulmonary disease) ([facility])    Coronary artery disease    Depression    Depression 05/15/2016   Encounter for antineoplastic chemotherapy 01/10/2016   GERD (gastroesophageal reflux disease)    Hypertension    Lung cancer ([place]) dx'd 01/2016   currently on chemo and radiation    NSTEMI (non-ST elevated myocardial infarction) ([place]) yrs ago   On home O2    qhs 2 liters at hs and prn   Pneumonia last time 2 yrs ago   Shortness of breath dyspnea    with activity    ALLERGIES:  has No Known Allergies.  MEDICATIONS:  Current Outpatient Medications  Medication Sig Dispense Refill   acetaminophen (TYLENOL) 325 MG tablet Take 2 tablets (650 mg total) by mouth every 6 (six) hours as needed for mild pain, fever or headache. 12 tablet 2   albuterol (PROVENTIL HFA;VENTOLIN HFA) 108 (90 BASE) MCG/ACT inhaler Inhale 2 puffs into the lungs every 4 (four) hours as needed for shortness of breath.      BREO ELLIPTA 100-25 MCG/INH AEPB Inhale 1 puff into the lungs daily.      Cholecalciferol (VITAMIN D3) 125 MCG (5000 UT) CAPS Take 1 capsule by mouth daily.  0   cyclobenzaprine (FLEXERIL) 10 MG tablet Take 1  tablet (10 mg total) by mouth 2 (two) times daily. *MAy take one additional tablet as needed for muscle spasms 60 tablet 2   dextromethorphan-guaiFENesin (MUCINEX DM) 30-600 MG 12hr tablet Take 1 tablet by mouth 2 (two) times daily. 40 tablet 0   diclofenac sodium (VOLTAREN) 1 % GEL Apply topically 4 (four) times daily as needed (massge gel into affected area(s) as needed for pain).   1   ENSURE (ENSURE) Take 237 mLs by mouth 3 (three) times daily between meals.     ipratropium-albuterol (DUONEB) 0.5-2.5 (3) MG/3ML SOLN Take 3 mLs by  nebulization 3 (three) times daily. 360 mL 2   metoprolol tartrate (LOPRESSOR) 25 MG tablet Take 1 tablet (25 mg total) by mouth 2 (two) times daily. 60 tablet 1   nicotine (NICODERM CQ - DOSED IN MG/24 HOURS) 14 mg/24hr patch Place 1 patch (14 mg total) onto the skin daily. 28 patch 0   oseltamivir (TAMIFLU) 75 MG capsule Take 1 capsule (75 mg total) by mouth 2 (two) times daily. 6 capsule 0   oxyCODONE-acetaminophen (PERCOCET/ROXICET) 5-325 MG tablet Take 1 tablet by mouth every 8 (eight) hours as needed for moderate pain. 12 tablet 0   pantoprazole (PROTONIX) 40 MG tablet Take 1 tablet (40 mg total) by mouth daily. 30 tablet 3   ROBITUSSIN 12 HOUR COUGH 30 MG/5ML liquid Take 30 mg by mouth every 4 (four) hours as needed for cough.      topiramate (TOPAMAX) 25 MG tablet Take 1 tablet by mouth daily at bedtime X 1 week; then increase to 25mg  BID. 45 tablet 2   No current facility-administered medications for this visit.     SURGICAL HISTORY:  Past Surgical History:  Procedure Laterality Date   CHOLECYSTECTOMY     COLONOSCOPY  2015   Results requested from [facility]   COLONOSCOPY     ESOPHAGOGASTRODUODENOSCOPY N/A 08/14/2015   Procedure: ESOPHAGOGASTRODUODENOSCOPY (EGD);  Surgeon: Daneil Dolin, MD;  Location: [facility];  Service: Endoscopy;  Laterality: N/A;  215    ESOPHAGOGASTRODUODENOSCOPY (EGD) WITH PROPOFOL N/A 09/13/2015   Procedure: ESOPHAGOGASTRODUODENOSCOPY (EGD) WITH PROPOFOL;  Surgeon: Milus Banister, MD;  Location: [facility];  Service: Endoscopy;  Laterality: N/A;   EUS N/A 03/12/2017   Procedure: UPPER ENDOSCOPIC ULTRASOUND (EUS) RADIAL;  Surgeon: Milus Banister, MD;  Location: [facility];  Service: Endoscopy;  Laterality: N/A;   TUMOR REMOVAL  2005   Benign   UPPER ESOPHAGEAL ENDOSCOPIC ULTRASOUND (EUS)  09/13/2015   Procedure: UPPER ESOPHAGEAL ENDOSCOPIC ULTRASOUND (EUS);  Surgeon: Milus Banister, MD;  Location: [facility];  Service:  Endoscopy;;   VIDEO BRONCHOSCOPY WITH ENDOBRONCHIAL NAVIGATION N/A 12/31/2015   Procedure: VIDEO BRONCHOSCOPY WITH ENDOBRONCHIAL NAVIGATION;  Surgeon: Melrose Nakayama, MD;  Location: [facility];  Service: Thoracic;  Laterality: N/A;   VIDEO BRONCHOSCOPY WITH ENDOBRONCHIAL ULTRASOUND N/A 11/08/2015   Procedure: VIDEO BRONCHOSCOPY WITH ENDOBRONCHIAL ULTRASOUND;  Surgeon: Ivin Poot, MD;  Location: [facility];  Service: Thoracic;  Laterality: N/A;    REVIEW OF SYSTEMS:  Constitutional: positive for fatigue Eyes: negative Ears, nose, mouth, throat, and face: negative Respiratory: positive for dyspnea on exertion and pleurisy/chest pain Cardiovascular: negative Gastrointestinal: negative Genitourinary:negative Integument/breast: negative Hematologic/lymphatic: negative Musculoskeletal:negative Neurological: negative Behavioral/Psych: negative Endocrine: negative Allergic/Immunologic: negative   PHYSICAL EXAMINATION: General appearance: alert, cooperative and no distress Head: Normocephalic, without obvious abnormality, atraumatic Neck: no adenopathy, no JVD, supple, symmetrical, trachea midline and thyroid not enlarged, symmetric, no tenderness/mass/nodules Lymph nodes: Cervical,  supraclavicular, and axillary nodes normal. Resp: clear to auscultation bilaterally Back: symmetric, no curvature. ROM normal. No CVA tenderness. Cardio: regular rate and rhythm, S1, S2 normal, no murmur, click, rub or gallop GI: soft, non-tender; bowel sounds normal; no masses,  no organomegaly Extremities: extremities normal, atraumatic, no cyanosis or edema Neurologic: Alert and oriented X 3, normal strength and tone. Normal symmetric reflexes. Normal coordination and gait  ECOG PERFORMANCE STATUS: 1 - Symptomatic but completely ambulatory  Blood pressure 130/81, pulse 93, temperature 98.5 F (36.9 C), temperature source Oral, resp. rate (!) 21, height 5\' 7"  (1.702 m), weight 139 lb 6.4 oz (63.2 kg), SpO2  96 %.  LABORATORY DATA: Lab Results  Component Value Date   WBC 5.7 09/27/2018   HGB 13.8 09/27/2018   HCT 42.4 09/27/2018   MCV 85.1 09/27/2018   PLT 268 09/27/2018      Chemistry      Component Value Date/Time   NA 144 09/27/2018 0910   NA 144 04/09/2017 0948   K 4.0 09/27/2018 0910   K 4.3 04/09/2017 0948   CL 106 09/27/2018 0910   CO2 27 09/27/2018 0910   CO2 25 04/09/2017 0948   BUN 15 09/27/2018 0910   BUN 16.8 04/09/2017 0948   CREATININE 0.69 09/27/2018 0910   CREATININE 0.8 04/09/2017 0948      Component Value Date/Time   CALCIUM 9.9 09/27/2018 0910   CALCIUM 9.1 04/09/2017 0948   ALKPHOS 222 (H) 09/27/2018 0910   ALKPHOS 216 (H) 04/09/2017 0948   AST 16 09/27/2018 0910   AST 12 04/09/2017 0948   ALT 10 09/27/2018 0910   ALT 13 04/09/2017 0948   BILITOT <0.2 (L) 09/27/2018 0910   BILITOT 0.22 04/09/2017 0948       RADIOGRAPHIC STUDIES:-   Ct Chest W Contrast  Result Date: 10/22/2018 CLINICAL DATA:  Recurrent non-small cell lung cancer, XRT complete, immunotherapy ongoing. EXAM: CT CHEST, ABDOMEN, AND PELVIS WITH CONTRAST TECHNIQUE: Multidetector CT imaging of the chest, abdomen and pelvis was performed following the standard protocol during bolus administration of intravenous contrast. CONTRAST:  180mL OMNIPAQUE IOHEXOL 300 MG/ML  SOLN COMPARISON:  07/30/2018 FINDINGS: CT CHEST FINDINGS Cardiovascular: Heart is normal in size.  No pericardial effusion. No evidence of thoracic aortic aneurysm. Atherosclerotic calcifications of the aortic arch. Coronary atherosclerosis of the LAD and right coronary artery. Mediastinum/Nodes: Suspicious mediastinal lymphadenopathy. Visualized thyroid is unremarkable. Lungs/Pleura: Progressive radiation changes with atelectasis/collapse in the right middle lobe (series 6/image 90). Improving nodular opacity in the posterior right lower lobe, now measuring 13 x 10 mm (series 6/image 101), previously 18 x 15 mm. Mild scarring in the  anterior left upper lobe and posteriorly in the lingula (series 6/image 81), likely reflecting radiation changes. Mild scarring anteriorly in the right upper lobe, possibly reflecting additional radiation changes. Mild nodular scarring in the medial right upper lobe (series 6/image 30), unchanged. Mild linear scarring in the posterior left lung base (series 6/image 127), unchanged. No new/suspicious pulmonary nodules. Moderate centrilobular and paraseptal emphysematous changes, upper lung predominant. No pleural effusion or pneumothorax. Musculoskeletal: Mild degenerative changes of the visualized thoracolumbar spine. Vertebral hemangioma at T12. CT ABDOMEN PELVIS FINDINGS Hepatobiliary: Liver is within normal limits. No suspicious/enhancing hepatic lesions. Suspected artifact along the lateral segment left hepatic lobe which is not favored to reflect a true lesion (series 2/image 58). Status post cholecystectomy. Mild central intrahepatic/extrahepatic ductal prominence, measuring up to 11 mm (coronal image 59), and smoothly tapering at the ampulla, likely postsurgical.  Mild soft tissue prominence at the ampulla (series 2/image 33) is stable dating back to at least 2017 and was not FDG avid on PET, benign. Pancreas: Within normal limits. Spleen: Within normal limits. Adrenals/Urinary Tract: Adrenal glands are within normal limits. Subcentimeter bilateral renal cysts.  No hydronephrosis. Bladder is within normal limits. Stomach/Bowel: Stomach is within normal limits. No evidence of bowel obstruction. Appendix is not discretely visualized. Mild sigmoid diverticulosis, without evidence of diverticulitis. Vascular/Lymphatic: No evidence of abdominal aortic aneurysm. Atherosclerotic calcifications of the abdominal aorta and branch vessels. No suspicious abdominopelvic lymphadenopathy. Reproductive: Calcified uterine fibroids. Bilateral ovaries are within normal limits. Other: No abdominopelvic ascites. Musculoskeletal:  Visualized osseous structures are within normal limits. Vertebral hemangioma at L5. IMPRESSION: Progressive radiation changes with atelectasis/collapse in the right middle lobe. Stable radiation changes in the left upper lobe/lingula. Improving nodular opacity in the posterior right lower lobe, now measuring 13 x 10 mm. This may reflect improving infectious/inflammation or possibly improving metastasis in the setting of immunotherapy. Continued attention on follow-up is suggested. No evidence of new/progressive disease. Electronically Signed   By: Julian Hy M.D.   On: 10/22/2018 19:59   Ct Abdomen Pelvis W Contrast  Result Date: 10/22/2018 CLINICAL DATA:  Recurrent non-small cell lung cancer, XRT complete, immunotherapy ongoing. EXAM: CT CHEST, ABDOMEN, AND PELVIS WITH CONTRAST TECHNIQUE: Multidetector CT imaging of the chest, abdomen and pelvis was performed following the standard protocol during bolus administration of intravenous contrast. CONTRAST:  136mL OMNIPAQUE IOHEXOL 300 MG/ML  SOLN COMPARISON:  07/30/2018 FINDINGS: CT CHEST FINDINGS Cardiovascular: Heart is normal in size.  No pericardial effusion. No evidence of thoracic aortic aneurysm. Atherosclerotic calcifications of the aortic arch. Coronary atherosclerosis of the LAD and right coronary artery. Mediastinum/Nodes: Suspicious mediastinal lymphadenopathy. Visualized thyroid is unremarkable. Lungs/Pleura: Progressive radiation changes with atelectasis/collapse in the right middle lobe (series 6/image 90). Improving nodular opacity in the posterior right lower lobe, now measuring 13 x 10 mm (series 6/image 101), previously 18 x 15 mm. Mild scarring in the anterior left upper lobe and posteriorly in the lingula (series 6/image 81), likely reflecting radiation changes. Mild scarring anteriorly in the right upper lobe, possibly reflecting additional radiation changes. Mild nodular scarring in the medial right upper lobe (series 6/image 30),  unchanged. Mild linear scarring in the posterior left lung base (series 6/image 127), unchanged. No new/suspicious pulmonary nodules. Moderate centrilobular and paraseptal emphysematous changes, upper lung predominant. No pleural effusion or pneumothorax. Musculoskeletal: Mild degenerative changes of the visualized thoracolumbar spine. Vertebral hemangioma at T12. CT ABDOMEN PELVIS FINDINGS Hepatobiliary: Liver is within normal limits. No suspicious/enhancing hepatic lesions. Suspected artifact along the lateral segment left hepatic lobe which is not favored to reflect a true lesion (series 2/image 58). Status post cholecystectomy. Mild central intrahepatic/extrahepatic ductal prominence, measuring up to 11 mm (coronal image 59), and smoothly tapering at the ampulla, likely postsurgical. Mild soft tissue prominence at the ampulla (series 2/image 33) is stable dating back to at least 2017 and was not FDG avid on PET, benign. Pancreas: Within normal limits. Spleen: Within normal limits. Adrenals/Urinary Tract: Adrenal glands are within normal limits. Subcentimeter bilateral renal cysts.  No hydronephrosis. Bladder is within normal limits. Stomach/Bowel: Stomach is within normal limits. No evidence of bowel obstruction. Appendix is not discretely visualized. Mild sigmoid diverticulosis, without evidence of diverticulitis. Vascular/Lymphatic: No evidence of abdominal aortic aneurysm. Atherosclerotic calcifications of the abdominal aorta and branch vessels. No suspicious abdominopelvic lymphadenopathy. Reproductive: Calcified uterine fibroids. Bilateral ovaries are within normal limits. Other:  No abdominopelvic ascites. Musculoskeletal: Visualized osseous structures are within normal limits. Vertebral hemangioma at L5. IMPRESSION: Progressive radiation changes with atelectasis/collapse in the right middle lobe. Stable radiation changes in the left upper lobe/lingula. Improving nodular opacity in the posterior right lower  lobe, now measuring 13 x 10 mm. This may reflect improving infectious/inflammation or possibly improving metastasis in the setting of immunotherapy. Continued attention on follow-up is suggested. No evidence of new/progressive disease. Electronically Signed   By: Julian Hy M.D.   On: 10/22/2018 19:59   Mm 3d Screen Breast Bilateral  Result Date: 10/13/2018 CLINICAL DATA:  Screening. EXAM: DIGITAL SCREENING BILATERAL MAMMOGRAM WITH TOMO AND CAD COMPARISON:  Previous exam(s). ACR Breast Density Category b: There are scattered areas of fibroglandular density. FINDINGS: In the left breast, a possible asymmetry warrants further evaluation. In the right breast, no findings suspicious for malignancy. Images were processed with CAD. IMPRESSION: Further evaluation is suggested for possible asymmetry in the left breast. RECOMMENDATION: Diagnostic mammogram and possibly ultrasound of the left breast. (Code:[id number]) The patient will be contacted regarding the findings, and additional imaging will be scheduled. BI-RADS CATEGORY  0: Incomplete. Need additional imaging evaluation and/or prior mammograms for comparison. Electronically Signed   By: Kristopher Oppenheim M.D.   On: 10/13/2018 16:04    ASSESSMENT AND PLAN:  This is a very pleasant 68 years old African-American female with a recurrent non-small cell lung cancer initially diagnosed as stage IIIA non-small cell lung cancer status post concurrent chemoradiation followed by consolidation chemotherapy with carboplatin and paclitaxel for 3 cycles. The patient was followed by observation but restaging imaging studies showed evidence for disease progression. She was started on second line treatment with immunotherapy with Nivolumab 480 mg IV every 4 weeks status post 19 cycles.   The patient continues to tolerate her treatment well with no concerning adverse effects. She had repeat CT scan of the chest, abdomen pelvis performed recently.  I personally and  independently reviewed the scans and discussed the results with the patient today. Her scan showed no concerning findings for disease progression. I recommended for the patient to continue on her current treatment with nivolumab and she will proceed with cycle #20 today. Regarding the asymmetry of the left breast, the patient is scheduled for repeat diagnostic mammogram as well as ultrasound tomorrow. The patient will come back for follow-up visit in 4 weeks for evaluation before the next cycle of her treatment. She was advised to call immediately if she has any concerning symptoms in the interval. The patient voices understanding of current disease status and treatment options and is in agreement with the current care plan. All questions were answered. The patient knows to call the clinic with any problems, questions or concerns. We can certainly see the patient much sooner if necessary.  Disclaimer: This note was dictated with voice recognition software. Similar sounding words can inadvertently be transcribed and may not be corrected upon review.

## 2018-10-26 ENCOUNTER — Ambulatory Visit (HOSPITAL_COMMUNITY): Admission: RE | Admit: 2018-10-26 | Payer: Medicare HMO | Source: Ambulatory Visit

## 2018-10-26 ENCOUNTER — Ambulatory Visit (HOSPITAL_COMMUNITY)
Admission: RE | Admit: 2018-10-26 | Discharge: 2018-10-26 | Disposition: A | Discharge status: Home / Self Care | Payer: Medicare HMO | Source: Ambulatory Visit | Attending: Internal Medicine | Admitting: Internal Medicine

## 2018-10-26 DIAGNOSIS — R928 Other abnormal and inconclusive findings on diagnostic imaging of breast: Secondary | ICD-10-CM | POA: Diagnosis not present

## 2018-11-22 ENCOUNTER — Inpatient Hospital Stay: Payer: Medicare HMO

## 2018-11-22 ENCOUNTER — Inpatient Hospital Stay: Payer: Medicare HMO | Attending: Oncology

## 2018-11-22 ENCOUNTER — Inpatient Hospital Stay (HOSPITAL_BASED_OUTPATIENT_CLINIC_OR_DEPARTMENT_OTHER): Payer: Medicare HMO | Admitting: Internal Medicine

## 2018-11-22 ENCOUNTER — Encounter: Payer: Self-pay | Admitting: Internal Medicine

## 2018-11-22 ENCOUNTER — Other Ambulatory Visit: Payer: Self-pay

## 2018-11-22 ENCOUNTER — Telehealth: Payer: Self-pay | Admitting: Internal Medicine

## 2018-11-22 VITALS — BP 137/86 | HR 81 | Temp 99.1°F | Resp 18 | Ht 67.0 in | Wt 139.3 lb

## 2018-11-22 DIAGNOSIS — Z5112 Encounter for antineoplastic immunotherapy: Secondary | ICD-10-CM | POA: Insufficient documentation

## 2018-11-22 DIAGNOSIS — C3491 Malignant neoplasm of unspecified part of right bronchus or lung: Secondary | ICD-10-CM | POA: Diagnosis not present

## 2018-11-22 DIAGNOSIS — Z79899 Other long term (current) drug therapy: Secondary | ICD-10-CM | POA: Insufficient documentation

## 2018-11-22 DIAGNOSIS — C342 Malignant neoplasm of middle lobe, bronchus or lung: Secondary | ICD-10-CM | POA: Diagnosis present

## 2018-11-22 LAB — CMP (CANCER CENTER ONLY)
ALT: 8 U/L (ref 0–44)
AST: 16 U/L (ref 15–41)
Albumin: 3.7 g/dL (ref 3.5–5.0)
Alkaline Phosphatase: 238 U/L — ABNORMAL HIGH (ref 38–126)
Anion gap: 11 (ref 5–15)
BUN: 13 mg/dL (ref 8–23)
CO2: 24 mmol/L (ref 22–32)
Calcium: 9.2 mg/dL (ref 8.9–10.3)
Chloride: 108 mmol/L (ref 98–111)
Creatinine: 0.75 mg/dL (ref 0.44–1.00)
GFR, Est AFR Am: >60 mL/min (ref >60)
GFR, Estimated: >60 mL/min (ref >60)
Glucose, Bld: 64 mg/dL — ABNORMAL LOW (ref 70–99)
Potassium: 4.1 mmol/L (ref 3.5–5.1)
Sodium: 143 mmol/L (ref 135–145)
Total Bilirubin: 0.2 mg/dL — ABNORMAL LOW (ref 0.3–1.2)
Total Protein: 6.7 g/dL (ref 6.5–8.1)

## 2018-11-22 LAB — CBC WITH DIFFERENTIAL (CANCER CENTER ONLY)
Abs Immature Granulocytes: 0.01 10*3/uL (ref 0.00–0.07)
Basophils Absolute: 0 10*3/uL (ref 0.0–0.1)
Basophils Relative: 1 %
Eosinophils Absolute: 0.1 10*3/uL (ref 0.0–0.5)
Eosinophils Relative: 2 %
HCT: 40.1 % (ref 36.0–46.0)
Hemoglobin: 13.2 g/dL (ref 12.0–15.0)
Immature Granulocytes: 0 %
Lymphocytes Relative: 26 %
Lymphs Abs: 1.3 10*3/uL (ref 0.7–4.0)
MCH: 27.8 pg (ref 26.0–34.0)
MCHC: 32.9 g/dL (ref 30.0–36.0)
MCV: 84.4 fL (ref 80.0–100.0)
Monocytes Absolute: 0.4 10*3/uL (ref 0.1–1.0)
Monocytes Relative: 8 %
Neutro Abs: 3.1 10*3/uL (ref 1.7–7.7)
Neutrophils Relative %: 63 %
Platelet Count: 213 10*3/uL (ref 150–400)
RBC: 4.75 MIL/uL (ref 3.87–5.11)
RDW: 16.7 % — ABNORMAL HIGH (ref 11.5–15.5)
WBC Count: 5 10*3/uL (ref 4.0–10.5)
nRBC: 0 % (ref 0.0–0.2)

## 2018-11-22 LAB — TSH: TSH: 2.707 u[IU]/mL (ref 0.308–3.960)

## 2018-11-22 MED ORDER — SODIUM CHLORIDE 0.9 % IV SOLN
480.0000 mg | Freq: Once | INTRAVENOUS | Status: AC
  Administered 2018-11-22: 480 mg via INTRAVENOUS
  Filled 2018-11-22: qty 48

## 2018-11-22 MED ORDER — SODIUM CHLORIDE 0.9 % IV SOLN
Freq: Once | INTRAVENOUS | Status: AC
  Administered 2018-11-22: 12:00:00 via INTRAVENOUS
  Filled 2018-11-22: qty 250

## 2018-11-22 NOTE — Patient Instructions (Signed)
[facility] Discharge Instructions for Patients Receiving Chemotherapy  Today you received the following chemotherapy agents: Nivolumab  To help prevent nausea and vomiting after your treatment, we encourage you to take your nausea medication as directed.    If you develop nausea and vomiting that is not controlled by your nausea medication, call the clinic.   BELOW ARE SYMPTOMS THAT SHOULD BE REPORTED IMMEDIATELY:  *FEVER GREATER THAN 100.5 F  *CHILLS WITH OR WITHOUT FEVER  NAUSEA AND VOMITING THAT IS NOT CONTROLLED WITH YOUR NAUSEA MEDICATION  *UNUSUAL SHORTNESS OF BREATH  *UNUSUAL BRUISING OR BLEEDING  TENDERNESS IN MOUTH AND THROAT WITH OR WITHOUT PRESENCE OF ULCERS  *URINARY PROBLEMS  *BOWEL PROBLEMS  UNUSUAL RASH Items with * indicate a potential emergency and should be followed up as soon as possible.  Feel free to call the clinic should you have any questions or concerns. The clinic phone number is [phone].  Please show the CHEMO ALERT CARD at check-in to the Emergency Department and triage nurse.   

## 2018-11-22 NOTE — Telephone Encounter (Signed)
Scheduled appt per 8/10 los - pt to get an updated schedule in treatment area. Pt aware to ask RN for schedule. Orders were not in when she was in scheduling.

## 2018-11-22 NOTE — Progress Notes (Signed)
[place] [phone]   [phone]  OFFICE PROGRESS NOTE  Barry Dienes, NP No address on file  DIAGNOSIS: Recurrent non-small cell lung cancer initially diagnosed as stage IIIA (T1b, N2, M0) non-small cell lung cancer presented with right middle lobe pulmonary nodule, mediastinal lymphadenopathy and highly suspicious for small nodule in the left upper lobe that could change her stage to stage IV that could present another synchronous primary lesion in the left upper lobe. This was diagnosed in September 2017.  PRIOR THERAPY:  1) Concurrent chemoradiation with weekly carboplatin for AUC of 2 and paclitaxel 45 MG/M2 status post 6 cycles last dose was given 02/25/2016 with partial response. 2) Consolidation chemotherapy with carboplatin for AUC of 5 and paclitaxel 175 MG/M2 every 3 weeks with Neulasta support. First dose 04/22/2016. Status post 3 cycles.  CURRENT THERAPY: Second line treatment with immunotherapy with Nivolumab 480 mg IV every 4 weeks status post 20 cycles..  INTERVAL HISTORY: Courtney Zuniga 68 y.o. female returns to the clinic today for follow-up visit.  The patient is feeling fine today with no concerning complaints except for the intermittent headache.  She is currently on pain medication by her primary care physician.  She also use some BC powder as needed.  She denied having any chest pain, shortness of breath, cough or hemoptysis.  She denied having any fever or chills.  She has no nausea, vomiting, diarrhea or constipation.  The patient has no significant weight loss or night sweats.  She is here for evaluation before starting cycle #21.   MEDICAL HISTORY: Past Medical History:  Diagnosis Date  . Anemia    as a young woman  . Arthritis    osteoartritis  . Asthma   . Brain tumor (benign) ([place]) 2005 [facility]   Benign  . Chronic headaches   . Chronic hip pain   . Chronic pain   . COPD (chronic obstructive pulmonary disease)  ([place])   . Coronary artery disease   . Depression   . Depression 05/15/2016  . Encounter for antineoplastic chemotherapy 01/10/2016  . GERD (gastroesophageal reflux disease)   . Hypertension   . Lung cancer ([place]) dx'd 01/2016   currently on chemo and radiation   . NSTEMI (non-ST elevated myocardial infarction) ([place]) yrs ago  . On home O2    qhs 2 liters at hs and prn  . Pneumonia last time 2 yrs ago  . Shortness of breath dyspnea    with activity    ALLERGIES:  has No Known Allergies.  MEDICATIONS:  Current Outpatient Medications  Medication Sig Dispense Refill  . acetaminophen (TYLENOL) 325 MG tablet Take 2 tablets (650 mg total) by mouth every 6 (six) hours as needed for mild pain, fever or headache. 12 tablet 2  . albuterol (PROVENTIL HFA;VENTOLIN HFA) 108 (90 BASE) MCG/ACT inhaler Inhale 2 puffs into the lungs every 4 (four) hours as needed for shortness of breath.     Marland Kitchen BREO ELLIPTA 100-25 MCG/INH AEPB Inhale 1 puff into the lungs daily.     . Cholecalciferol (VITAMIN D3) 125 MCG (5000 UT) CAPS Take 1 capsule by mouth daily.  0  . cyclobenzaprine (FLEXERIL) 10 MG tablet Take 1 tablet (10 mg total) by mouth 2 (two) times daily. *MAy take one additional tablet as needed for muscle spasms 60 tablet 2  . dextromethorphan-guaiFENesin (MUCINEX DM) 30-600 MG 12hr tablet Take 1 tablet by mouth 2 (two) times daily. 40 tablet 0  .  diclofenac sodium (VOLTAREN) 1 % GEL Apply topically 4 (four) times daily as needed (massge gel into affected area(s) as needed for pain).   1  . ENSURE (ENSURE) Take 237 mLs by mouth 3 (three) times daily between meals.    Marland Kitchen ipratropium-albuterol (DUONEB) 0.5-2.5 (3) MG/3ML SOLN Take 3 mLs by nebulization 3 (three) times daily. 360 mL 2  . metoprolol tartrate (LOPRESSOR) 25 MG tablet Take 1 tablet (25 mg total) by mouth 2 (two) times daily. 60 tablet 1  . nicotine (NICODERM CQ - DOSED IN MG/24 HOURS) 14 mg/24hr patch Place 1 patch (14 mg total) onto the skin daily.  28 patch 0  . oseltamivir (TAMIFLU) 75 MG capsule Take 1 capsule (75 mg total) by mouth 2 (two) times daily. 6 capsule 0  . oxyCODONE-acetaminophen (PERCOCET/ROXICET) 5-325 MG tablet Take 1 tablet by mouth every 8 (eight) hours as needed for moderate pain. 12 tablet 0  . pantoprazole (PROTONIX) 40 MG tablet Take 1 tablet (40 mg total) by mouth daily. 30 tablet 3  . ROBITUSSIN 12 HOUR COUGH 30 MG/5ML liquid Take 30 mg by mouth every 4 (four) hours as needed for cough.     . topiramate (TOPAMAX) 25 MG tablet Take 1 tablet by mouth daily at bedtime X 1 week; then increase to 25mg  BID. 45 tablet 2   No current facility-administered medications for this visit.     SURGICAL HISTORY:  Past Surgical History:  Procedure Laterality Date  . CHOLECYSTECTOMY    . COLONOSCOPY  2015   Results requested from [facility]  . COLONOSCOPY    . ESOPHAGOGASTRODUODENOSCOPY N/A 08/14/2015   Procedure: ESOPHAGOGASTRODUODENOSCOPY (EGD);  Surgeon: Daneil Dolin, MD;  [facility];  Service: Endoscopy;  Laterality: N/A;  215   . ESOPHAGOGASTRODUODENOSCOPY (EGD) WITH PROPOFOL N/A 09/13/2015   Procedure: ESOPHAGOGASTRODUODENOSCOPY (EGD) WITH PROPOFOL;  Surgeon: Milus Banister, MD;  Location: [facility];  Service: Endoscopy;  Laterality: N/A;  . EUS N/A 03/12/2017   Procedure: UPPER ENDOSCOPIC ULTRASOUND (EUS) RADIAL;  Surgeon: Milus Banister, MD;  Location: [facility];  Service: Endoscopy;  Laterality: N/A;  . TUMOR REMOVAL  2005   Benign  . UPPER ESOPHAGEAL ENDOSCOPIC ULTRASOUND (EUS)  09/13/2015   Procedure: UPPER ESOPHAGEAL ENDOSCOPIC ULTRASOUND (EUS);  Surgeon: Milus Banister, MD;  Location: [facility];  Service: Endoscopy;;  . VIDEO BRONCHOSCOPY WITH ENDOBRONCHIAL NAVIGATION N/A 12/31/2015   Procedure: VIDEO BRONCHOSCOPY WITH ENDOBRONCHIAL NAVIGATION;  Surgeon: Melrose Nakayama, MD;  Location: [facility];  Service: Thoracic;  Laterality: N/A;  . VIDEO BRONCHOSCOPY WITH ENDOBRONCHIAL  ULTRASOUND N/A 11/08/2015   Procedure: VIDEO BRONCHOSCOPY WITH ENDOBRONCHIAL ULTRASOUND;  Surgeon: Ivin Poot, MD;  Location: [facility] OR;  Service: Thoracic;  Laterality: N/A;    REVIEW OF SYSTEMS:  A comprehensive review of systems was negative except for: Neurological: positive for headaches   PHYSICAL EXAMINATION: General appearance: alert, cooperative and no distress Head: Normocephalic, without obvious abnormality, atraumatic Neck: no adenopathy, no JVD, supple, symmetrical, trachea midline and thyroid not enlarged, symmetric, no tenderness/mass/nodules Lymph nodes: Cervical, supraclavicular, and axillary nodes normal. Resp: clear to auscultation bilaterally Back: symmetric, no curvature. ROM normal. No CVA tenderness. Cardio: regular rate and rhythm, S1, S2 normal, no murmur, click, rub or gallop GI: soft, non-tender; bowel sounds normal; no masses,  no organomegaly Extremities: extremities normal, atraumatic, no cyanosis or edema  ECOG PERFORMANCE STATUS: 1 - Symptomatic but completely ambulatory  Blood pressure 137/86, pulse 81, temperature 99.1 F (37.3 C),  temperature source Oral, resp. rate 18, height 5\' 7"  (1.702 m), weight 139 lb 4.8 oz (63.2 kg), SpO2 100 %.  LABORATORY DATA: Lab Results  Component Value Date   WBC 5.0 11/22/2018   HGB 13.2 11/22/2018   HCT 40.1 11/22/2018   MCV 84.4 11/22/2018   PLT 213 11/22/2018      Chemistry      Component Value Date/Time   NA 143 11/22/2018 1027   NA 144 04/09/2017 0948   K 4.1 11/22/2018 1027   K 4.3 04/09/2017 0948   CL 108 11/22/2018 1027   CO2 24 11/22/2018 1027   CO2 25 04/09/2017 0948   BUN 13 11/22/2018 1027   BUN 16.8 04/09/2017 0948   CREATININE 0.75 11/22/2018 1027   CREATININE 0.8 04/09/2017 0948      Component Value Date/Time   CALCIUM 9.2 11/22/2018 1027   CALCIUM 9.1 04/09/2017 0948   ALKPHOS 238 (H) 11/22/2018 1027   ALKPHOS 216 (H) 04/09/2017 0948   AST 16 11/22/2018 1027   AST 12 04/09/2017 0948    ALT 8 11/22/2018 1027   ALT 13 04/09/2017 0948   BILITOT 0.2 (L) 11/22/2018 1027   BILITOT 0.22 04/09/2017 0948       RADIOGRAPHIC STUDIES:-   Mm Diag Breast Tomo Uni Left  Result Date: 10/26/2018 CLINICAL DATA:  Screening recall for asymmetry seen in the left breast on the MLO view only. EXAM: DIGITAL DIAGNOSTIC UNILATERAL LEFT MAMMOGRAM WITH CAD AND TOMO COMPARISON:  Previous exam(s). ACR Breast Density Category b: There are scattered areas of fibroglandular density. FINDINGS: Additional tomograms were performed of the left breast. The initially questioned possible left breast asymmetry resolves with the fibroglandular pattern in the left breast stable in appearance. There is no mammographic evidence of malignancy in the left breast. Mammographic images were processed with CAD. IMPRESSION: No mammographic evidence of malignancy in the left breast. RECOMMENDATION: Screening mammogram in one year.(Code:[id number]-B-01Y) I have discussed the findings and recommendations with the patient. Results were also provided in writing at the conclusion of the visit. If applicable, a reminder letter will be sent to the patient regarding the next appointment. BI-RADS CATEGORY  1: Negative. Electronically Signed   By: Everlean Alstrom M.D.   On: 10/26/2018 08:59    ASSESSMENT AND PLAN:  This is a very pleasant 68 years old African-American female with a recurrent non-small cell lung cancer initially diagnosed as stage IIIA non-small cell lung cancer status post concurrent chemoradiation followed by consolidation chemotherapy with carboplatin and paclitaxel for 3 cycles. The patient was followed by observation but restaging imaging studies showed evidence for disease progression. She was started on second line treatment with immunotherapy with Nivolumab 480 mg IV every 4 weeks status post 20 cycles.   The patient is doing fine today with no concerning complaints except for the intermittent headache. I recommended for  her to proceed with cycle #21 today as planned. She will come back for follow-up visit in 4 weeks for evaluation before the next cycle of her treatment. She was advised to call immediately if she has any other concerning symptoms in the interval. The patient voices understanding of current disease status and treatment options and is in agreement with the current care plan. All questions were answered. The patient knows to call the clinic with any problems, questions or concerns. We can certainly see the patient much sooner if necessary.  Disclaimer: This note was dictated with voice recognition software. Similar sounding words can inadvertently be transcribed  and may not be corrected upon review.

## 2018-12-21 ENCOUNTER — Ambulatory Visit: Payer: Medicare HMO

## 2018-12-21 ENCOUNTER — Ambulatory Visit: Payer: Medicare HMO | Admitting: Internal Medicine

## 2018-12-21 ENCOUNTER — Other Ambulatory Visit: Payer: Medicare HMO

## 2018-12-22 ENCOUNTER — Other Ambulatory Visit: Payer: Self-pay | Admitting: *Deleted

## 2018-12-22 DIAGNOSIS — C3491 Malignant neoplasm of unspecified part of right bronchus or lung: Secondary | ICD-10-CM

## 2018-12-23 ENCOUNTER — Inpatient Hospital Stay (HOSPITAL_BASED_OUTPATIENT_CLINIC_OR_DEPARTMENT_OTHER): Payer: Medicare HMO | Admitting: Internal Medicine

## 2018-12-23 ENCOUNTER — Inpatient Hospital Stay: Payer: Medicare HMO | Attending: Oncology

## 2018-12-23 ENCOUNTER — Inpatient Hospital Stay: Payer: Medicare HMO

## 2018-12-23 ENCOUNTER — Encounter: Payer: Self-pay | Admitting: Internal Medicine

## 2018-12-23 ENCOUNTER — Other Ambulatory Visit: Payer: Self-pay

## 2018-12-23 VITALS — BP 130/82 | HR 88 | Temp 98.9°F | Resp 18

## 2018-12-23 DIAGNOSIS — C3412 Malignant neoplasm of upper lobe, left bronchus or lung: Secondary | ICD-10-CM

## 2018-12-23 DIAGNOSIS — Z5112 Encounter for antineoplastic immunotherapy: Secondary | ICD-10-CM | POA: Diagnosis not present

## 2018-12-23 DIAGNOSIS — C342 Malignant neoplasm of middle lobe, bronchus or lung: Secondary | ICD-10-CM | POA: Insufficient documentation

## 2018-12-23 DIAGNOSIS — C3491 Malignant neoplasm of unspecified part of right bronchus or lung: Secondary | ICD-10-CM

## 2018-12-23 DIAGNOSIS — Z923 Personal history of irradiation: Secondary | ICD-10-CM

## 2018-12-23 DIAGNOSIS — C3490 Malignant neoplasm of unspecified part of unspecified bronchus or lung: Secondary | ICD-10-CM

## 2018-12-23 DIAGNOSIS — I10 Essential (primary) hypertension: Secondary | ICD-10-CM

## 2018-12-23 DIAGNOSIS — Z9221 Personal history of antineoplastic chemotherapy: Secondary | ICD-10-CM

## 2018-12-23 DIAGNOSIS — Z0000 Encounter for general adult medical examination without abnormal findings: Secondary | ICD-10-CM

## 2018-12-23 DIAGNOSIS — Z23 Encounter for immunization: Secondary | ICD-10-CM | POA: Insufficient documentation

## 2018-12-23 LAB — CMP (CANCER CENTER ONLY)
ALT: 26 U/L (ref 0–44)
AST: 37 U/L (ref 15–41)
Albumin: 3.9 g/dL (ref 3.5–5.0)
Alkaline Phosphatase: 263 U/L — ABNORMAL HIGH (ref 38–126)
Anion gap: 10 (ref 5–15)
BUN: 21 mg/dL (ref 8–23)
CO2: 27 mmol/L (ref 22–32)
Calcium: 9.4 mg/dL (ref 8.9–10.3)
Chloride: 108 mmol/L (ref 98–111)
Creatinine: 0.71 mg/dL (ref 0.44–1.00)
GFR, Est AFR Am: >60 mL/min (ref >60)
GFR, Estimated: >60 mL/min (ref >60)
Glucose, Bld: 76 mg/dL (ref 70–99)
Potassium: 3.6 mmol/L (ref 3.5–5.1)
Sodium: 145 mmol/L (ref 135–145)
Total Bilirubin: <0.2 mg/dL — ABNORMAL LOW (ref 0.3–1.2)
Total Protein: 7 g/dL (ref 6.5–8.1)

## 2018-12-23 LAB — CBC WITH DIFFERENTIAL (CANCER CENTER ONLY)
Abs Immature Granulocytes: 0.02 10*3/uL (ref 0.00–0.07)
Basophils Absolute: 0 10*3/uL (ref 0.0–0.1)
Basophils Relative: 1 %
Eosinophils Absolute: 0.1 10*3/uL (ref 0.0–0.5)
Eosinophils Relative: 1 %
HCT: 40.7 % (ref 36.0–46.0)
Hemoglobin: 13.9 g/dL (ref 12.0–15.0)
Immature Granulocytes: 0 %
Lymphocytes Relative: 24 %
Lymphs Abs: 1.5 10*3/uL (ref 0.7–4.0)
MCH: 28.4 pg (ref 26.0–34.0)
MCHC: 34.2 g/dL (ref 30.0–36.0)
MCV: 83.2 fL (ref 80.0–100.0)
Monocytes Absolute: 0.4 10*3/uL (ref 0.1–1.0)
Monocytes Relative: 7 %
Neutro Abs: 4.2 10*3/uL (ref 1.7–7.7)
Neutrophils Relative %: 67 %
Platelet Count: 214 10*3/uL (ref 150–400)
RBC: 4.89 MIL/uL (ref 3.87–5.11)
RDW: 15.9 % — ABNORMAL HIGH (ref 11.5–15.5)
WBC Count: 6.3 10*3/uL (ref 4.0–10.5)
nRBC: 0 % (ref 0.0–0.2)

## 2018-12-23 MED ORDER — SODIUM CHLORIDE 0.9 % IV SOLN
Freq: Once | INTRAVENOUS | Status: AC
  Administered 2018-12-23: 10:00:00 via INTRAVENOUS
  Filled 2018-12-23: qty 250

## 2018-12-23 MED ORDER — SODIUM CHLORIDE 0.9 % IV SOLN
480.0000 mg | Freq: Once | INTRAVENOUS | Status: AC
  Administered 2018-12-23: 480 mg via INTRAVENOUS
  Filled 2018-12-23: qty 48

## 2018-12-23 MED ORDER — INFLUENZA VAC A&B SA ADJ QUAD 0.5 ML IM PRSY
0.5000 mL | PREFILLED_SYRINGE | Freq: Once | INTRAMUSCULAR | Status: AC
  Administered 2018-12-23: 10:00:00 0.5 mL via INTRAMUSCULAR
  Filled 2018-12-23: qty 0.5

## 2018-12-23 NOTE — Patient Instructions (Signed)
[place] Discharge Instructions for Patients Receiving Chemotherapy  Today you received the following chemotherapy agents Nivolumab (OPDIVO).  To help prevent nausea and vomiting after your treatment, we encourage you to take your nausea medication as prescribed.   If you develop nausea and vomiting that is not controlled by your nausea medication, call the clinic.   BELOW ARE SYMPTOMS THAT SHOULD BE REPORTED IMMEDIATELY:  *FEVER GREATER THAN 100.5 F  *CHILLS WITH OR WITHOUT FEVER  NAUSEA AND VOMITING THAT IS NOT CONTROLLED WITH YOUR NAUSEA MEDICATION  *UNUSUAL SHORTNESS OF BREATH  *UNUSUAL BRUISING OR BLEEDING  TENDERNESS IN MOUTH AND THROAT WITH OR WITHOUT PRESENCE OF ULCERS  *URINARY PROBLEMS  *BOWEL PROBLEMS  UNUSUAL RASH Items with * indicate a potential emergency and should be followed up as soon as possible.  Feel free to call the clinic should you have any questions or concerns. The clinic phone number is [phone].  Please show the [place] at check-in to the Emergency Department and triage nurse.  Influenza Virus Vaccine injection What is this medicine? INFLUENZA VIRUS VACCINE (in floo EN zuh VAHY ruhs vak SEEN) helps to reduce the risk of getting influenza also known as the flu. The vaccine only helps protect you against some strains of the flu. This medicine may be used for other purposes; ask your health care provider or pharmacist if you have questions. COMMON BRAND NAME(S): Afluria, Afluria Quadrivalent, Agriflu, Alfuria, FLUAD, Fluarix, Fluarix Quadrivalent, Flublok, Flublok Quadrivalent, FLUCELVAX, Flulaval, Fluvirin, Fluzone, Fluzone High-Dose, Fluzone Intradermal What should I tell my health care provider before I take this medicine? They need to know if you have any of these conditions:  bleeding disorder like hemophilia  fever or infection  Guillain-Barre syndrome or other neurological problems  immune system  problems  infection with the human immunodeficiency virus (HIV) or AIDS  low blood platelet counts  multiple sclerosis  an unusual or allergic reaction to influenza virus vaccine, latex, other medicines, foods, dyes, or preservatives. Different brands of vaccines contain different allergens. Some may contain latex or eggs. Talk to your doctor about your allergies to make sure that you get the right vaccine.  pregnant or trying to get pregnant  breast-feeding How should I use this medicine? This vaccine is for injection into a muscle or under the skin. It is given by a health care professional. A copy of Vaccine Information Statements will be given before each vaccination. Read this sheet carefully each time. The sheet may change frequently. Talk to your healthcare provider to see which vaccines are right for you. Some vaccines should not be used in all age groups. Overdosage: If you think you have taken too much of this medicine contact a poison control center or emergency room at once. NOTE: This medicine is only for you. Do not share this medicine with others. What if I miss a dose? This does not apply. What may interact with this medicine?  chemotherapy or radiation therapy  medicines that lower your immune system like etanercept, anakinra, infliximab, and adalimumab  medicines that treat or prevent blood clots like warfarin  phenytoin  steroid medicines like prednisone or cortisone  theophylline  vaccines This list may not describe all possible interactions. Give your health care provider a list of all the medicines, herbs, non-prescription drugs, or dietary supplements you use. Also tell them if you smoke, drink alcohol, or use illegal drugs. Some items may interact with your medicine. What should I watch for while using  this medicine? Report any side effects that do not go away within 3 days to your doctor or health care professional. Call your health care provider if any  unusual symptoms occur within 6 weeks of receiving this vaccine. You may still catch the flu, but the illness is not usually as bad. You cannot get the flu from the vaccine. The vaccine will not protect against colds or other illnesses that may cause fever. The vaccine is needed every year. What side effects may I notice from receiving this medicine? Side effects that you should report to your doctor or health care professional as soon as possible:  allergic reactions like skin rash, itching or hives, swelling of the face, lips, or tongue Side effects that usually do not require medical attention (report to your doctor or health care professional if they continue or are bothersome):  fever  headache  muscle aches and pains  pain, tenderness, redness, or swelling at the injection site  tiredness This list may not describe all possible side effects. Call your doctor for medical advice about side effects. You may report side effects to FDA at 1-800-FDA-1088. Where should I keep my medicine? The vaccine will be given by a health care professional in a clinic, pharmacy, doctor's office, or other health care setting. You will not be given vaccine doses to store at home. NOTE: This sheet is a summary. It may not cover all possible information. If you have questions about this medicine, talk to your doctor, pharmacist, or health care provider.  2020 Elsevier/Gold Standard (2018-02-23 08:45:43)  Coronavirus (COVID-19) Are you at risk?  Are you at risk for the Coronavirus (COVID-19)?  To be considered HIGH RISK for Coronavirus (COVID-19), you have to meet the following criteria:  . Traveled to [place], [place], [place], [place] or [place]; or in the [place] to [place], [place], [place], or [place]; and have fever, cough, and shortness of breath within the last 2 weeks of travel OR . Been in close contact with a person diagnosed with COVID-19 within the last 2 weeks and have fever,  cough, and shortness of breath . IF YOU DO NOT MEET THESE CRITERIA, YOU ARE CONSIDERED LOW RISK FOR COVID-19.  What to do if you are HIGH RISK for COVID-19?  Marland Kitchen If you are having a medical emergency, call 911. . Seek medical care right away. Before you go to a doctor's office, urgent care or emergency department, call ahead and tell them about your recent travel, contact with someone diagnosed with COVID-19, and your symptoms. You should receive instructions from your physician's office regarding next steps of care.  . When you arrive at healthcare provider, tell the healthcare staff immediately you have returned from visiting [place], [place], [place], [place] or [place]; or traveled in the [place] to [place], [place], [place], or [place]; in the last two weeks or you have been in close contact with a person diagnosed with COVID-19 in the last 2 weeks.   . Tell the health care staff about your symptoms: fever, cough and shortness of breath. . After you have been seen by a medical provider, you will be either: o Tested for (COVID-19) and discharged home on quarantine except to seek medical care if symptoms worsen, and asked to  - Stay home and avoid contact with others until you get your results (4-5 days)  - Avoid travel on public transportation if possible (such as bus, train, or airplane) or o [employer]  to the Emergency Department by EMS for evaluation, COVID-19 testing, and possible admission depending on your condition and test results.  What to do if you are LOW RISK for COVID-19?  Reduce your risk of any infection by using the same precautions used for avoiding the common cold or flu:  Marland Kitchen Wash your hands often with soap and warm water for at least 20 seconds.  If soap and water are not readily available, use an alcohol-based hand sanitizer with at least 60% alcohol.  . If coughing or sneezing, cover your mouth and nose by coughing or sneezing into the elbow areas of your shirt or coat,  into a tissue or into your sleeve (not your hands). . Avoid shaking hands with others and consider head nods or verbal greetings only. . Avoid touching your eyes, nose, or mouth with unwashed hands.  . Avoid close contact with people who are sick. . Avoid places or events with large numbers of people in one location, like concerts or sporting events. . Carefully consider travel plans you have or are making. . If you are planning any travel outside or inside the [place], visit the CDC's Travelers' Health webpage for the latest health notices. . If you have some symptoms but not all symptoms, continue to monitor at home and seek medical attention if your symptoms worsen. . If you are having a medical emergency, call 911.   [place] / e-Visit: [email or web address]         [facility]: [facility]: [phone]                   [facility]: [phone]

## 2018-12-23 NOTE — Progress Notes (Signed)
[place] [phone]   [phone]  OFFICE PROGRESS NOTE  Barry Dienes, NP No address on file  DIAGNOSIS: Recurrent non-small cell lung cancer initially diagnosed as stage IIIA (T1b, N2, M0) non-small cell lung cancer presented with right middle lobe pulmonary nodule, mediastinal lymphadenopathy and highly suspicious for small nodule in the left upper lobe that could change her stage to stage IV that could present another synchronous primary lesion in the left upper lobe. This was diagnosed in September 2017.  PRIOR THERAPY:  1) Concurrent chemoradiation with weekly carboplatin for AUC of 2 and paclitaxel 45 MG/M2 status post 6 cycles last dose was given 02/25/2016 with partial response. 2) Consolidation chemotherapy with carboplatin for AUC of 5 and paclitaxel 175 MG/M2 every 3 weeks with Neulasta support. First dose 04/22/2016. Status post 3 cycles.  CURRENT THERAPY: Second line treatment with immunotherapy with Nivolumab 480 mg IV every 4 weeks status post 21 cycles..  INTERVAL HISTORY: Courtney Zuniga 69 y.o. female returns to the clinic today for follow-up visit.  The patient is feeling fine today with no concerning complaints except for mild shortness of breath with exertion.  She denied having any significant chest pain, cough or hemoptysis.  She denied having any nausea, vomiting, diarrhea or constipation.  She has no recent weight loss or night sweats.  She has no fever or chills.  She has been tolerating her treatment with nivolumab fairly well.  She is here for evaluation before starting cycle #22.   MEDICAL HISTORY: Past Medical History:  Diagnosis Date  . Anemia    as a young woman  . Arthritis    osteoartritis  . Asthma   . Brain tumor (benign) ([place]) 2005 [facility]   Benign  . Chronic headaches   . Chronic hip pain   . Chronic pain   . COPD (chronic obstructive pulmonary disease) ([place])   . Coronary artery disease   . Depression    . Depression 05/15/2016  . Encounter for antineoplastic chemotherapy 01/10/2016  . GERD (gastroesophageal reflux disease)   . Hypertension   . Lung cancer ([place]) dx'd 01/2016   currently on chemo and radiation   . NSTEMI (non-ST elevated myocardial infarction) ([place]) yrs ago  . On home O2    qhs 2 liters at hs and prn  . Pneumonia last time 2 yrs ago  . Shortness of breath dyspnea    with activity    ALLERGIES:  has No Known Allergies.  MEDICATIONS:  Current Outpatient Medications  Medication Sig Dispense Refill  . acetaminophen (TYLENOL) 325 MG tablet Take 2 tablets (650 mg total) by mouth every 6 (six) hours as needed for mild pain, fever or headache. 12 tablet 2  . albuterol (PROVENTIL HFA;VENTOLIN HFA) 108 (90 BASE) MCG/ACT inhaler Inhale 2 puffs into the lungs every 4 (four) hours as needed for shortness of breath.     Marland Kitchen BREO ELLIPTA 100-25 MCG/INH AEPB Inhale 1 puff into the lungs daily.     . Cholecalciferol (VITAMIN D3) 125 MCG (5000 UT) CAPS Take 1 capsule by mouth daily.  0  . cyclobenzaprine (FLEXERIL) 10 MG tablet Take 1 tablet (10 mg total) by mouth 2 (two) times daily. *MAy take one additional tablet as needed for muscle spasms 60 tablet 2  . dextromethorphan-guaiFENesin (MUCINEX DM) 30-600 MG 12hr tablet Take 1 tablet by mouth 2 (two) times daily. 40 tablet 0  . diclofenac sodium (VOLTAREN) 1 % GEL Apply topically 4 (four)  times daily as needed (massge gel into affected area(s) as needed for pain).   1  . ENSURE (ENSURE) Take 237 mLs by mouth 3 (three) times daily between meals.    Marland Kitchen ipratropium-albuterol (DUONEB) 0.5-2.5 (3) MG/3ML SOLN Take 3 mLs by nebulization 3 (three) times daily. 360 mL 2  . metoprolol tartrate (LOPRESSOR) 25 MG tablet Take 1 tablet (25 mg total) by mouth 2 (two) times daily. 60 tablet 1  . nicotine (NICODERM CQ - DOSED IN MG/24 HOURS) 14 mg/24hr patch Place 1 patch (14 mg total) onto the skin daily. 28 patch 0  . oseltamivir (TAMIFLU) 75 MG capsule  Take 1 capsule (75 mg total) by mouth 2 (two) times daily. 6 capsule 0  . oxyCODONE-acetaminophen (PERCOCET/ROXICET) 5-325 MG tablet Take 1 tablet by mouth every 8 (eight) hours as needed for moderate pain. 12 tablet 0  . pantoprazole (PROTONIX) 40 MG tablet Take 1 tablet (40 mg total) by mouth daily. 30 tablet 3  . ROBITUSSIN 12 HOUR COUGH 30 MG/5ML liquid Take 30 mg by mouth every 4 (four) hours as needed for cough.     . topiramate (TOPAMAX) 25 MG tablet Take 1 tablet by mouth daily at bedtime X 1 week; then increase to 25mg  BID. 45 tablet 2   No current facility-administered medications for this visit.     SURGICAL HISTORY:  Past Surgical History:  Procedure Laterality Date  . CHOLECYSTECTOMY    . COLONOSCOPY  2015   [facility] requested from [facility]  . COLONOSCOPY    . ESOPHAGOGASTRODUODENOSCOPY N/A 08/14/2015   Procedure: ESOPHAGOGASTRODUODENOSCOPY (EGD);  Surgeon: Daneil Dolin, MD;  Location: [facility];  Service: Endoscopy;  Laterality: N/A;  215   . ESOPHAGOGASTRODUODENOSCOPY (EGD) WITH PROPOFOL N/A 09/13/2015   Procedure: ESOPHAGOGASTRODUODENOSCOPY (EGD) WITH PROPOFOL;  Surgeon: Milus Banister, MD;  Location: [facility];  Service: Endoscopy;  Laterality: N/A;  . EUS N/A 03/12/2017   Procedure: UPPER ENDOSCOPIC ULTRASOUND (EUS) RADIAL;  Surgeon: Milus Banister, MD;  Location: [facility];  Service: Endoscopy;  Laterality: N/A;  . TUMOR REMOVAL  2005   Benign  . UPPER ESOPHAGEAL ENDOSCOPIC ULTRASOUND (EUS)  09/13/2015   Procedure: UPPER ESOPHAGEAL ENDOSCOPIC ULTRASOUND (EUS);  Surgeon: Milus Banister, MD;  Location: [facility];  Service: Endoscopy;;  . VIDEO BRONCHOSCOPY WITH ENDOBRONCHIAL NAVIGATION N/A 12/31/2015   Procedure: VIDEO BRONCHOSCOPY WITH ENDOBRONCHIAL NAVIGATION;  Surgeon: Melrose Nakayama, MD;  Location: [place];  Service: Thoracic;  Laterality: N/A;  . VIDEO BRONCHOSCOPY WITH ENDOBRONCHIAL ULTRASOUND N/A 11/08/2015   Procedure: VIDEO  BRONCHOSCOPY WITH ENDOBRONCHIAL ULTRASOUND;  Surgeon: Ivin Poot, MD;  Location: [facility] OR;  Service: Thoracic;  Laterality: N/A;    REVIEW OF SYSTEMS:  A comprehensive review of systems was negative except for: Respiratory: positive for dyspnea on exertion   PHYSICAL EXAMINATION: General appearance: alert, cooperative and no distress Head: Normocephalic, without obvious abnormality, atraumatic Neck: no adenopathy, no JVD, supple, symmetrical, trachea midline and thyroid not enlarged, symmetric, no tenderness/mass/nodules Lymph nodes: Cervical, supraclavicular, and axillary nodes normal. Resp: clear to auscultation bilaterally Back: symmetric, no curvature. ROM normal. No CVA tenderness. Cardio: regular rate and rhythm, S1, S2 normal, no murmur, click, rub or gallop GI: soft, non-tender; bowel sounds normal; no masses,  no organomegaly Extremities: extremities normal, atraumatic, no cyanosis or edema  ECOG PERFORMANCE STATUS: 1 - Symptomatic but completely ambulatory  There were no vitals taken for this visit.  LABORATORY DATA: Lab Results  Component Value Date  WBC 6.3 12/23/2018   HGB 13.9 12/23/2018   HCT 40.7 12/23/2018   MCV 83.2 12/23/2018   PLT 214 12/23/2018      Chemistry      Component Value Date/Time   NA 143 11/22/2018 1027   NA 144 04/09/2017 0948   K 4.1 11/22/2018 1027   K 4.3 04/09/2017 0948   CL 108 11/22/2018 1027   CO2 24 11/22/2018 1027   CO2 25 04/09/2017 0948   BUN 13 11/22/2018 1027   BUN 16.8 04/09/2017 0948   CREATININE 0.75 11/22/2018 1027   CREATININE 0.8 04/09/2017 0948      Component Value Date/Time   CALCIUM 9.2 11/22/2018 1027   CALCIUM 9.1 04/09/2017 0948   ALKPHOS 238 (H) 11/22/2018 1027   ALKPHOS 216 (H) 04/09/2017 0948   AST 16 11/22/2018 1027   AST 12 04/09/2017 0948   ALT 8 11/22/2018 1027   ALT 13 04/09/2017 0948   BILITOT 0.2 (L) 11/22/2018 1027   BILITOT 0.22 04/09/2017 0948       RADIOGRAPHIC STUDIES:-   No results  found.  ASSESSMENT AND PLAN:  This is a very pleasant 68 years old African-American female with a recurrent non-small cell lung cancer initially diagnosed as stage IIIA non-small cell lung cancer status post concurrent chemoradiation followed by consolidation chemotherapy with carboplatin and paclitaxel for 3 cycles. The patient was followed by observation but restaging imaging studies showed evidence for disease progression. She was started on second line treatment with immunotherapy with Nivolumab 480 mg IV every 4 weeks status post 21 cycles.   The patient has been tolerating her treatment well with no concerning adverse effects. I recommended for her to proceed with cycle #22 today as planned. I will see her back for follow-up visit in 4 weeks for evaluation with repeat CT scan of the chest, abdomen pelvis for restaging of her disease. The patient was advised to call immediately if she has any concerning symptoms in the interval. The patient voices understanding of current disease status and treatment options and is in agreement with the current care plan. All questions were answered. The patient knows to call the clinic with any problems, questions or concerns. We can certainly see the patient much sooner if necessary.  Disclaimer: This note was dictated with voice recognition software. Similar sounding words can inadvertently be transcribed and may not be corrected upon review.

## 2019-01-18 ENCOUNTER — Encounter (HOSPITAL_COMMUNITY): Payer: Self-pay

## 2019-01-18 ENCOUNTER — Other Ambulatory Visit: Payer: Self-pay

## 2019-01-18 ENCOUNTER — Ambulatory Visit (HOSPITAL_COMMUNITY)
Admission: RE | Admit: 2019-01-18 | Discharge: 2019-01-18 | Disposition: A | Discharge status: Home / Self Care | Payer: Medicare HMO | Source: Ambulatory Visit | Attending: Internal Medicine | Admitting: Internal Medicine

## 2019-01-18 DIAGNOSIS — C3490 Malignant neoplasm of unspecified part of unspecified bronchus or lung: Secondary | ICD-10-CM | POA: Diagnosis present

## 2019-01-18 MED ORDER — SODIUM CHLORIDE (PF) 0.9 % IJ SOLN
INTRAMUSCULAR | Status: AC
  Filled 2019-01-18: qty 50

## 2019-01-18 MED ORDER — IOHEXOL 300 MG/ML  SOLN
100.0000 mL | Freq: Once | INTRAMUSCULAR | Status: AC | PRN
  Administered 2019-01-18: 10:00:00 100 mL via INTRAVENOUS

## 2019-01-20 ENCOUNTER — Inpatient Hospital Stay: Payer: Medicare HMO | Attending: Oncology | Admitting: Physician Assistant

## 2019-01-20 ENCOUNTER — Telehealth: Payer: Self-pay

## 2019-01-20 ENCOUNTER — Telehealth: Payer: Self-pay | Admitting: Physician Assistant

## 2019-01-20 ENCOUNTER — Inpatient Hospital Stay: Payer: Medicare HMO

## 2019-01-20 ENCOUNTER — Other Ambulatory Visit: Payer: Self-pay

## 2019-01-20 ENCOUNTER — Ambulatory Visit: Payer: Medicare HMO

## 2019-01-20 ENCOUNTER — Encounter: Payer: Self-pay | Admitting: Physician Assistant

## 2019-01-20 VITALS — HR 106

## 2019-01-20 VITALS — BP 144/84 | HR 111 | Temp 99.2°F | Resp 22 | Ht 67.0 in | Wt 138.7 lb

## 2019-01-20 DIAGNOSIS — C3491 Malignant neoplasm of unspecified part of right bronchus or lung: Secondary | ICD-10-CM | POA: Diagnosis not present

## 2019-01-20 DIAGNOSIS — Z5112 Encounter for antineoplastic immunotherapy: Secondary | ICD-10-CM | POA: Diagnosis not present

## 2019-01-20 DIAGNOSIS — C3490 Malignant neoplasm of unspecified part of unspecified bronchus or lung: Secondary | ICD-10-CM

## 2019-01-20 DIAGNOSIS — C342 Malignant neoplasm of middle lobe, bronchus or lung: Secondary | ICD-10-CM | POA: Insufficient documentation

## 2019-01-20 DIAGNOSIS — R519 Headache, unspecified: Secondary | ICD-10-CM

## 2019-01-20 DIAGNOSIS — Z79899 Other long term (current) drug therapy: Secondary | ICD-10-CM | POA: Diagnosis not present

## 2019-01-20 LAB — CBC WITH DIFFERENTIAL (CANCER CENTER ONLY)
Abs Immature Granulocytes: 0.04 10*3/uL (ref 0.00–0.07)
Basophils Absolute: 0 10*3/uL (ref 0.0–0.1)
Basophils Relative: 0 %
Eosinophils Absolute: 0 10*3/uL (ref 0.0–0.5)
Eosinophils Relative: 0 %
HCT: 37.5 % (ref 36.0–46.0)
Hemoglobin: 12.8 g/dL (ref 12.0–15.0)
Immature Granulocytes: 0 %
Lymphocytes Relative: 11 %
Lymphs Abs: 1 10*3/uL (ref 0.7–4.0)
MCH: 28 pg (ref 26.0–34.0)
MCHC: 34.1 g/dL (ref 30.0–36.0)
MCV: 82.1 fL (ref 80.0–100.0)
Monocytes Absolute: 0.6 10*3/uL (ref 0.1–1.0)
Monocytes Relative: 6 %
Neutro Abs: 8 10*3/uL — ABNORMAL HIGH (ref 1.7–7.7)
Neutrophils Relative %: 83 %
Platelet Count: 186 10*3/uL (ref 150–400)
RBC: 4.57 MIL/uL (ref 3.87–5.11)
RDW: 15.6 % — ABNORMAL HIGH (ref 11.5–15.5)
WBC Count: 9.8 10*3/uL (ref 4.0–10.5)
nRBC: 0 % (ref 0.0–0.2)

## 2019-01-20 LAB — CMP (CANCER CENTER ONLY)
ALT: 28 U/L (ref 0–44)
AST: 24 U/L (ref 15–41)
Albumin: 3.6 g/dL (ref 3.5–5.0)
Alkaline Phosphatase: 300 U/L — ABNORMAL HIGH (ref 38–126)
Anion gap: 11 (ref 5–15)
BUN: 14 mg/dL (ref 8–23)
CO2: 27 mmol/L (ref 22–32)
Calcium: 9.5 mg/dL (ref 8.9–10.3)
Chloride: 106 mmol/L (ref 98–111)
Creatinine: 0.69 mg/dL (ref 0.44–1.00)
GFR, Est AFR Am: >60 mL/min (ref >60)
GFR, Estimated: >60 mL/min (ref >60)
Glucose, Bld: 76 mg/dL (ref 70–99)
Potassium: 3.6 mmol/L (ref 3.5–5.1)
Sodium: 144 mmol/L (ref 135–145)
Total Bilirubin: 0.3 mg/dL (ref 0.3–1.2)
Total Protein: 7.1 g/dL (ref 6.5–8.1)

## 2019-01-20 LAB — TSH: TSH: 1.007 u[IU]/mL (ref 0.308–3.960)

## 2019-01-20 MED ORDER — SODIUM CHLORIDE 0.9 % IV SOLN
Freq: Once | INTRAVENOUS | Status: AC
  Administered 2019-01-20: 13:00:00 via INTRAVENOUS
  Filled 2019-01-20: qty 250

## 2019-01-20 MED ORDER — SODIUM CHLORIDE 0.9 % IV SOLN
480.0000 mg | Freq: Once | INTRAVENOUS | Status: AC
  Administered 2019-01-20: 14:00:00 480 mg via INTRAVENOUS
  Filled 2019-01-20: qty 48

## 2019-01-20 MED ORDER — ACETAMINOPHEN 325 MG PO TABS
ORAL_TABLET | ORAL | Status: AC
  Filled 2019-01-20: qty 2

## 2019-01-20 MED ORDER — ACETAMINOPHEN 325 MG PO TABS
650.0000 mg | ORAL_TABLET | Freq: Once | ORAL | Status: AC
  Administered 2019-01-20: 650 mg via ORAL

## 2019-01-20 NOTE — Progress Notes (Signed)
[facility] OFFICE PROGRESS NOTE  Courtney Dienes, NP No address on file  DIAGNOSIS: Recurrent non-small cell lung cancer initially diagnosed as stage IIIA (T1b, N2, M0) non-small cell lung cancer presented with right middle lobe pulmonary nodule, mediastinal lymphadenopathy and highly suspicious for small nodule in the left upper lobe that could change her stage to stage IV that could present another synchronous primary lesion in the left upper lobe. This was diagnosed in September 2017.  PRIOR THERAPY: 1) Concurrent chemoradiation with weekly carboplatin for AUC of 2 and paclitaxel 45 MG/M2 status post 6 cycles last dose was given 02/25/2016 with partial response. 2) Consolidation chemotherapy with carboplatin for AUC of 5 and paclitaxel 175 MG/M2 every 3 weeks with Neulasta support. First dose 04/22/2016. Status post 3 cycles.  CURRENT THERAPY: Second line treatment with immunotherapy with Nivolumab 480 mg IV every 4 weeks status post 22 cycles.  INTERVAL HISTORY: Courtney Zuniga 68 y.o. female returns to the clinic for a follow up visit. The patient reports that her main complaint is related to a headache. She has a history of chronic headaches. She states that this headache is different. She states that she has been waking up in the morning recently with a headache which is localized to either the frontal region or the occipital region. She states that it lasts the entire day. She has a history of a brain tumor in 2005. The patient denies with any known provider for routine follow-up. The patient denies any nausea, vomiting, or gait changes. She has unchanged blurring of the left eye which has been present for several years.   Otherwise, she has been tolerating treatment well. She denies recent fevers, chills, night sweats, or weight loss. She reports her baseline shortness of breath with exertion for which is is on home oxygen via nasal canula. The patient did not bring portable  oxygen while driving to the clinic today. She denies any diarrhea or constipation. She denies any rashes or skin changes. She recently had a restaging CT scan performed. She is here for evaluation and to discuss her scan results before starting cycle #23.     MEDICAL HISTORY: Past Medical History:  Diagnosis Date  . Anemia    as a young woman  . Arthritis    osteoartritis  . Asthma   . Brain tumor (benign) ([place]) 2005 [facility]   Benign  . Chronic headaches   . Chronic hip pain   . Chronic pain   . COPD (chronic obstructive pulmonary disease) ([place])   . Coronary artery disease   . Depression   . Depression 05/15/2016  . Encounter for antineoplastic chemotherapy 01/10/2016  . GERD (gastroesophageal reflux disease)   . Hypertension   . Lung cancer ([place]) dx'd 01/2016   currently on chemo and radiation   . NSTEMI (non-ST elevated myocardial infarction) ([place]) yrs ago  . On home O2    qhs 2 liters at hs and prn  . Pneumonia last time 2 yrs ago  . Shortness of breath dyspnea    with activity    ALLERGIES:  has No Known Allergies.  MEDICATIONS:  Current Outpatient Medications  Medication Sig Dispense Refill  . acetaminophen (TYLENOL) 325 MG tablet Take 2 tablets (650 mg total) by mouth every 6 (six) hours as needed for mild pain, fever or headache. 12 tablet 2  . albuterol (PROVENTIL HFA;VENTOLIN HFA) 108 (90 BASE) MCG/ACT inhaler Inhale 2 puffs into the lungs every 4 (four) hours as needed for shortness  of breath.     Marland Kitchen BREO ELLIPTA 100-25 MCG/INH AEPB Inhale 1 puff into the lungs daily.     . Cholecalciferol (VITAMIN D3) 125 MCG (5000 UT) CAPS Take 1 capsule by mouth daily.  0  . cyclobenzaprine (FLEXERIL) 10 MG tablet Take 1 tablet (10 mg total) by mouth 2 (two) times daily. *MAy take one additional tablet as needed for muscle spasms 60 tablet 2  . dextromethorphan-guaiFENesin (MUCINEX DM) 30-600 MG 12hr tablet Take 1 tablet by mouth 2 (two) times daily. 40 tablet 0  . diclofenac  sodium (VOLTAREN) 1 % GEL Apply topically 4 (four) times daily as needed (massge gel into affected area(s) as needed for pain).   1  . ENSURE (ENSURE) Take 237 mLs by mouth 3 (three) times daily between meals.    Marland Kitchen ipratropium-albuterol (DUONEB) 0.5-2.5 (3) MG/3ML SOLN Take 3 mLs by nebulization 3 (three) times daily. 360 mL 2  . metoprolol tartrate (LOPRESSOR) 25 MG tablet Take 1 tablet (25 mg total) by mouth 2 (two) times daily. 60 tablet 1  . nicotine (NICODERM CQ - DOSED IN MG/24 HOURS) 14 mg/24hr patch Place 1 patch (14 mg total) onto the skin daily. 28 patch 0  . oseltamivir (TAMIFLU) 75 MG capsule Take 1 capsule (75 mg total) by mouth 2 (two) times daily. 6 capsule 0  . oxyCODONE-acetaminophen (PERCOCET/ROXICET) 5-325 MG tablet Take 1 tablet by mouth every 8 (eight) hours as needed for moderate pain. 12 tablet 0  . pantoprazole (PROTONIX) 40 MG tablet Take 1 tablet (40 mg total) by mouth daily. 30 tablet 3  . ROBITUSSIN 12 HOUR COUGH 30 MG/5ML liquid Take 30 mg by mouth every 4 (four) hours as needed for cough.     . topiramate (TOPAMAX) 25 MG tablet Take 1 tablet by mouth daily at bedtime X 1 week; then increase to 25mg  BID. 45 tablet 2   No current facility-administered medications for this visit.     SURGICAL HISTORY:  Past Surgical History:  Procedure Laterality Date  . CHOLECYSTECTOMY    . COLONOSCOPY  2015   Results requested from [facility]  . COLONOSCOPY    . ESOPHAGOGASTRODUODENOSCOPY N/A 08/14/2015   Procedure: ESOPHAGOGASTRODUODENOSCOPY (EGD);  Surgeon: Daneil Dolin, MD;  Location: [facility];  Service: Endoscopy;  Laterality: N/A;  215   . ESOPHAGOGASTRODUODENOSCOPY (EGD) WITH PROPOFOL N/A 09/13/2015   Procedure: ESOPHAGOGASTRODUODENOSCOPY (EGD) WITH PROPOFOL;  Surgeon: Milus Banister, MD;  Location: [facility];  Service: Endoscopy;  Laterality: N/A;  . EUS N/A 03/12/2017   Procedure: UPPER ENDOSCOPIC ULTRASOUND (EUS) RADIAL;  Surgeon: Milus Banister,  MD;  Location: [facility];  Service: Endoscopy;  Laterality: N/A;  . TUMOR REMOVAL  2005   Benign  . UPPER ESOPHAGEAL ENDOSCOPIC ULTRASOUND (EUS)  09/13/2015   Procedure: UPPER ESOPHAGEAL ENDOSCOPIC ULTRASOUND (EUS);  Surgeon: Milus Banister, MD;  Location: [facility];  Service: Endoscopy;;  . VIDEO BRONCHOSCOPY WITH ENDOBRONCHIAL NAVIGATION N/A 12/31/2015   Procedure: VIDEO BRONCHOSCOPY WITH ENDOBRONCHIAL NAVIGATION;  Surgeon: Melrose Nakayama, MD;  Location: [facility];  Service: Thoracic;  Laterality: N/A;  . VIDEO BRONCHOSCOPY WITH ENDOBRONCHIAL ULTRASOUND N/A 11/08/2015   Procedure: VIDEO BRONCHOSCOPY WITH ENDOBRONCHIAL ULTRASOUND;  Surgeon: Ivin Poot, MD;  Location: [facility];  Service: Thoracic;  Laterality: N/A;    REVIEW OF SYSTEMS:   Review of Systems  Constitutional: Negative for appetite change, chills, fatigue, fever and unexpected weight change.  HENT: Negative for mouth sores, nosebleeds, sore throat and  trouble swallowing.   Eyes: Negative for eye problems and icterus.  Respiratory: Positive for shortness of breath with exertion. Negative for cough, hemoptysis, and wheezing.   Cardiovascular: Negative for chest pain and leg swelling.  Gastrointestinal: Negative for abdominal pain, constipation, diarrhea, nausea and vomiting.  Genitourinary: Negative for bladder incontinence, difficulty urinating, dysuria, frequency and hematuria.   Musculoskeletal: Negative for back pain, gait problem, neck pain and neck stiffness.  Skin: Negative for itching and rash.  Neurological: Positive for chronic headache. Negative for dizziness, extremity weakness, gait problem, light-headedness and seizures.  Hematological: Negative for adenopathy. Does not bruise/bleed easily.  Psychiatric/Behavioral: Negative for confusion, depression and sleep disturbance. The patient is not nervous/anxious.      PHYSICAL EXAMINATION:  Blood pressure (!) 144/84, pulse (!) 111, temperature 99.2 F (37.3 C),  temperature source Temporal, resp. rate (!) 22, height 5\' 7"  (1.702 m), weight 138 lb 11.2 oz (62.9 kg), SpO2 94 %.  ECOG PERFORMANCE STATUS: 1 - Symptomatic but completely ambulatory  Physical Exam  Constitutional: Oriented to person, place, and time and well-developed, well-nourished, and in no distress.  HENT:  Head: Normocephalic and atraumatic.  Mouth/Throat: Oropharynx is clear and moist. No oropharyngeal exudate.  Eyes: Chronic twitching left eyelid secondary to hx of brain tumor per patient report. Conjunctivae are normal. Right eye exhibits no discharge. Left eye exhibits no discharge. No scleral icterus.  Neck: Normal range of motion. Neck supple.  Cardiovascular: Normal rate, regular rhythm, normal heart sounds and intact distal pulses.   Pulmonary/Chest: Effort normal and breath sounds normal. No respiratory distress. No wheezes. No rales.  Abdominal: Soft. Bowel sounds are normal. Exhibits no distension and no mass. There is no tenderness.  Musculoskeletal: Normal range of motion. Exhibits no edema.  Lymphadenopathy:    No cervical adenopathy.  Neurological: Left facial twitching and weakness (baseline from brain tumor in 2005). Alert and oriented to person, place, and time. Exhibits normal muscle tone. Gait normal. Coordination normal.  Skin: Skin is warm and dry. No rash noted. Not diaphoretic. No erythema. No pallor.  Psychiatric: Mood, memory and judgment normal.  Vitals reviewed.  LABORATORY DATA: Lab Results  Component Value Date   WBC 9.8 01/20/2019   HGB 12.8 01/20/2019   HCT 37.5 01/20/2019   MCV 82.1 01/20/2019   PLT 186 01/20/2019      Chemistry      Component Value Date/Time   NA 144 01/20/2019 1019   NA 144 04/09/2017 0948   K 3.6 01/20/2019 1019   K 4.3 04/09/2017 0948   CL 106 01/20/2019 1019   CO2 27 01/20/2019 1019   CO2 25 04/09/2017 0948   BUN 14 01/20/2019 1019   BUN 16.8 04/09/2017 0948   CREATININE 0.69 01/20/2019 1019   CREATININE 0.8  04/09/2017 0948      Component Value Date/Time   CALCIUM 9.5 01/20/2019 1019   CALCIUM 9.1 04/09/2017 0948   ALKPHOS 300 (H) 01/20/2019 1019   ALKPHOS 216 (H) 04/09/2017 0948   AST 24 01/20/2019 1019   AST 12 04/09/2017 0948   ALT 28 01/20/2019 1019   ALT 13 04/09/2017 0948   BILITOT 0.3 01/20/2019 1019   BILITOT 0.22 04/09/2017 0948       RADIOGRAPHIC STUDIES:  Ct Chest W Contrast  Result Date: 01/18/2019 CLINICAL DATA:  Restaging lung cancer. EXAM: CT CHEST, ABDOMEN, AND PELVIS WITH CONTRAST TECHNIQUE: Multidetector CT imaging of the chest, abdomen and pelvis was performed following the standard protocol during bolus administration of  intravenous contrast. CONTRAST:  143mL OMNIPAQUE IOHEXOL 300 MG/ML  SOLN COMPARISON:  10/22/2018 FINDINGS: CT CHEST FINDINGS Cardiovascular: Normal heart size. No pericardial effusion identified. Aortic atherosclerosis. Lad and RCA coronary artery atherosclerotic calcifications. Mediastinum/Nodes: Normal appearance of the thyroid gland. The trachea appears patent and is midline. Normal appearance of the esophagus. No mediastinal or hilar adenopathy. Lungs/Pleura: No pleural effusion. Advanced centrilobular and paraseptal emphysema. Masslike architectural distortion and fibrosis within the right middle lobe are again identified, image 101/6. The appearance is stable when compared with the previous exam and is favored to represent changes secondary to radiation. Nodular area within the posterior right lower lobe is again noted and appears unchanged in appearance measuring 1.6 x 1.0 cm, image 105/6. Scar with nodularity in the medial right upper lobe is stable, image 38/6. 4 mm nodular density within the anteromedial left upper lobe is again noted. Not significantly changed in the interval. No new pulmonary nodules or masses identified Musculoskeletal: The bones appear diffusely osteopenic. No aggressive lytic or sclerotic bone lesions identified. CT ABDOMEN PELVIS  FINDINGS Hepatobiliary: No focal liver abnormality is seen. Previous cholecystectomy with chronic increase caliber of the common bile duct. Pancreas: Unremarkable. No pancreatic ductal dilatation or surrounding inflammatory changes. Spleen: Normal in size without focal abnormality. Adrenals/Urinary Tract: Normal appearance of the adrenal glands. The kidneys are unremarkable. No mass or hydronephrosis. Urinary bladder appears normal. Stomach/Bowel: The stomach and the small bowel loops are unremarkable. The appendix is visualized and appears normal. Colonic diverticula noted. No inflammation. Vascular/Lymphatic: Aortic atherosclerosis. No aneurysm. No abdominopelvic adenopathy identified. Reproductive: Enlarged partially calcified fibroid uterus is again noted. No adnexal mass. Other: No free fluid or fluid collections within the abdomen or pelvis. Musculoskeletal: No acute or significant osseous findings. IMPRESSION: 1. Stable exam. Similar appearance of masslike architectural distortion and fibrosis within the right middle lobe compatible with changes due to external beam radiation. 2. Unchanged appearance of nodular density within the posterior right lower lobe, nonspecific. Continued attention on follow-up imaging advised. 3. No new or progressive disease identified. 4. Aortic Atherosclerosis (ICD10-I70.0) and Emphysema (ICD10-J43.9). 5. Coronary artery atherosclerotic calcifications. Electronically Signed   By: Kerby Moors M.D.   On: 01/18/2019 11:16   Ct Abdomen Pelvis W Contrast  Result Date: 01/18/2019 CLINICAL DATA:  Restaging lung cancer. EXAM: CT CHEST, ABDOMEN, AND PELVIS WITH CONTRAST TECHNIQUE: Multidetector CT imaging of the chest, abdomen and pelvis was performed following the standard protocol during bolus administration of intravenous contrast. CONTRAST:  12mL OMNIPAQUE IOHEXOL 300 MG/ML  SOLN COMPARISON:  10/22/2018 FINDINGS: CT CHEST FINDINGS Cardiovascular: Normal heart size. No  pericardial effusion identified. Aortic atherosclerosis. Lad and RCA coronary artery atherosclerotic calcifications. Mediastinum/Nodes: Normal appearance of the thyroid gland. The trachea appears patent and is midline. Normal appearance of the esophagus. No mediastinal or hilar adenopathy. Lungs/Pleura: No pleural effusion. Advanced centrilobular and paraseptal emphysema. Masslike architectural distortion and fibrosis within the right middle lobe are again identified, image 101/6. The appearance is stable when compared with the previous exam and is favored to represent changes secondary to radiation. Nodular area within the posterior right lower lobe is again noted and appears unchanged in appearance measuring 1.6 x 1.0 cm, image 105/6. Scar with nodularity in the medial right upper lobe is stable, image 38/6. 4 mm nodular density within the anteromedial left upper lobe is again noted. Not significantly changed in the interval. No new pulmonary nodules or masses identified Musculoskeletal: The bones appear diffusely osteopenic. No aggressive lytic or sclerotic bone lesions  identified. CT ABDOMEN PELVIS FINDINGS Hepatobiliary: No focal liver abnormality is seen. Previous cholecystectomy with chronic increase caliber of the common bile duct. Pancreas: Unremarkable. No pancreatic ductal dilatation or surrounding inflammatory changes. Spleen: Normal in size without focal abnormality. Adrenals/Urinary Tract: Normal appearance of the adrenal glands. The kidneys are unremarkable. No mass or hydronephrosis. Urinary bladder appears normal. Stomach/Bowel: The stomach and the small bowel loops are unremarkable. The appendix is visualized and appears normal. Colonic diverticula noted. No inflammation. Vascular/Lymphatic: Aortic atherosclerosis. No aneurysm. No abdominopelvic adenopathy identified. Reproductive: Enlarged partially calcified fibroid uterus is again noted. No adnexal mass. Other: No free fluid or fluid collections  within the abdomen or pelvis. Musculoskeletal: No acute or significant osseous findings. IMPRESSION: 1. Stable exam. Similar appearance of masslike architectural distortion and fibrosis within the right middle lobe compatible with changes due to external beam radiation. 2. Unchanged appearance of nodular density within the posterior right lower lobe, nonspecific. Continued attention on follow-up imaging advised. 3. No new or progressive disease identified. 4. Aortic Atherosclerosis (ICD10-I70.0) and Emphysema (ICD10-J43.9). 5. Coronary artery atherosclerotic calcifications. Electronically Signed   By: Kerby Moors M.D.   On: 01/18/2019 11:16     ASSESSMENT/PLAN:  This is a very pleasant 68 year old African-American female with recurrent metastatic non-small cell lung cancer, adenocarcinoma of the right middle lobe.  She was initially diagnosed as stage IIIa in September 2017.    She completed concurrent chemoradiation followed by consolidation with carboplatin and paclitaxel. She is status post 3 cycles. The patient was on observation but a restaging CT scan showed evidence of disease progression.  She is currently undergoing treatment with second line with immunotherapy with nivolumab 400 mg IV every 4 weeks.  She is status post 22 cycles.   The patient recently had a restaging CT scan performed.  Dr. Earlie Server personally and independently reviewed the scan and discussed the results with the patient today.  Scan did not show any concerning evidence for disease progression.  Dr. Julien Nordmann recommends the patient continue on the same treatment.  She will receive cycle #23 today scheduled.  We will see the patient back for follow-up visit in 4 weeks for evaluation before starting cycle #24.  The patient has a history of a brain tumor in 2005.  Since this time, the patient has been having chronic intractable headaches.  The patient is reporting a new headache that is different in character. She will  receive tylenol while in infusion today. Additionally, we will order a brain MRI to rule out any evidence of metastatic disease to the brain.   The patient arrived to the clinic today without her portable oxygen. Her oxygen saturation was 84% when she arrived to the clinic. Her oxygen improved to 94% when placed on 2 L of oxygen via nasal canula while in the clinic. Our office reached out to [place], her oxygen tank supplier, and they brought an extra tank for her to bring home after her treatment today.   The patient was advised to call immediately if she has any concerning symptoms in the interval. The patient voices understanding of current disease status and treatment options and is in agreement with the current care plan. All questions were answered. The patient knows to call the clinic with any problems, questions or concerns. We can certainly see the patient much sooner if necessary.   Orders Placed This Encounter  Procedures  . MR Brain W Wo Contrast    Standing Status:   Future    Standing  Expiration Date:   01/20/2020    Order Specific Question:   ** REASON FOR EXAM (FREE TEXT)    Answer:   Lung Cancer and new worsening headches    Order Specific Question:   If indicated for the ordered procedure, I authorize the administration of contrast media per Radiology protocol    Answer:   Yes    Order Specific Question:   What is the patient's sedation requirement?    Answer:   No Sedation    Order Specific Question:   Does the patient have a pacemaker or implanted devices?    Answer:   No    Order Specific Question:   Use SRS Protocol?    Answer:   No    Order Specific Question:   Radiology Contrast Protocol - do NOT remove file path    Answer:   [email or web address]    Order Specific Question:   Preferred imaging location?    Answer:   [facility] (table limit-350 lbs)     Cassandra L Heilingoetter, PA-C 01/20/19  ADDENDUM: Hematology/Oncology  Attending: I had a face-to-face encounter with the patient today.  I recommended her care plan.  This is a very pleasant 68 years old African-American female with recurrent non-small cell lung cancer, squamous cell carcinoma.  She is currently undergoing treatment with immunotherapy with nivolumab 480 mg IV every 4 weeks is status post 22 cycles.  The patient has been tolerating this treatment well with no concerning adverse effects.  She is currently on home oxygen but the patient forgot to bring the oxygen tank to the clinic with her.  Her oxygen saturation was down to 84%.  This was improved by oxygen to 94% on 2 L.  We will arrange for the patient to have her oxygen tank available before leaving to home. She had a recent CT scan of the chest, abdomen and pelvis for restaging of her disease. Her scan showed no concerning findings for disease progression. I recommended for the patient to continue her current treatment with nivolumab and she will proceed with cycle #23 today. For the recurrent headache, will arrange for the patient to have MRI of the brain to rule out brain metastasis. The patient will come back for follow-up visit in 4 weeks for evaluation before the next cycle of her treatment. She was advised to call immediately if she has any concerning symptoms in the interval.  Disclaimer: This note was dictated with voice recognition software. Similar sounding words can inadvertently be transcribed and may be missed upon review. Eilleen Kempf, MD 01/20/19

## 2019-01-20 NOTE — Telephone Encounter (Signed)
Called the patient about her appointment which was scheduled for this morning. The patient as not arrived to her 8:00 lab appointment yet. Called to check on the patient. Unfortunately, the mailbox is full and was unable to leave a message regarding instructions to reschedule. Will try to continue to follow up.

## 2019-01-20 NOTE — Patient Instructions (Signed)
[facility] Discharge Instructions for Patients Receiving Chemotherapy  Today you received the following chemotherapy agents Nivolumab (OPDIVO).  To help prevent nausea and vomiting after your treatment, we encourage you to take your nausea medication as prescribed.   If you develop nausea and vomiting that is not controlled by your nausea medication, call the clinic.   BELOW ARE SYMPTOMS THAT SHOULD BE REPORTED IMMEDIATELY:  *FEVER GREATER THAN 100.5 F  *CHILLS WITH OR WITHOUT FEVER  NAUSEA AND VOMITING THAT IS NOT CONTROLLED WITH YOUR NAUSEA MEDICATION  *UNUSUAL SHORTNESS OF BREATH  *UNUSUAL BRUISING OR BLEEDING  TENDERNESS IN MOUTH AND THROAT WITH OR WITHOUT PRESENCE OF ULCERS  *URINARY PROBLEMS  *BOWEL PROBLEMS  UNUSUAL RASH Items with * indicate a potential emergency and should be followed up as soon as possible.  Feel free to call the clinic should you have any questions or concerns. The clinic phone number is [phone].  Please show the [facility] at check-in to the Emergency Department and triage nurse.  Influenza Virus Vaccine injection What is this medicine? INFLUENZA VIRUS VACCINE (in floo EN zuh VAHY ruhs vak SEEN) helps to reduce the risk of getting influenza also known as the flu. The vaccine only helps protect you against some strains of the flu. This medicine may be used for other purposes; ask your health care provider or pharmacist if you have questions. COMMON BRAND NAME(S): Afluria, Afluria Quadrivalent, Agriflu, Alfuria, FLUAD, Fluarix, Fluarix Quadrivalent, Flublok, Flublok Quadrivalent, FLUCELVAX, Flulaval, Fluvirin, Fluzone, Fluzone High-Dose, Fluzone Intradermal What should I tell my health care provider before I take this medicine? They need to know if you have any of these conditions:  bleeding disorder like hemophilia  fever or infection  Guillain-Barre syndrome or other neurological problems  immune system  problems  infection with the human immunodeficiency virus (HIV) or AIDS  low blood platelet counts  multiple sclerosis  an unusual or allergic reaction to influenza virus vaccine, latex, other medicines, foods, dyes, or preservatives. Different brands of vaccines contain different allergens. Some may contain latex or eggs. Talk to your doctor about your allergies to make sure that you get the right vaccine.  pregnant or trying to get pregnant  breast-feeding How should I use this medicine? This vaccine is for injection into a muscle or under the skin. It is given by a health care professional. A copy of Vaccine Information Statements will be given before each vaccination. Read this sheet carefully each time. The sheet may change frequently. Talk to your healthcare provider to see which vaccines are right for you. Some vaccines should not be used in all age groups. Overdosage: If you think you have taken too much of this medicine contact a poison control center or emergency room at once. NOTE: This medicine is only for you. Do not share this medicine with others. What if I miss a dose? This does not apply. What may interact with this medicine?  chemotherapy or radiation therapy  medicines that lower your immune system like etanercept, anakinra, infliximab, and adalimumab  medicines that treat or prevent blood clots like warfarin  phenytoin  steroid medicines like prednisone or cortisone  theophylline  vaccines This list may not describe all possible interactions. Give your health care provider a list of all the medicines, herbs, non-prescription drugs, or dietary supplements you use. Also tell them if you smoke, drink alcohol, or use illegal drugs. Some items may interact with your medicine. What should I watch for while using  this medicine? Report any side effects that do not go away within 3 days to your doctor or health care professional. Call your health care provider if any  unusual symptoms occur within 6 weeks of receiving this vaccine. You may still catch the flu, but the illness is not usually as bad. You cannot get the flu from the vaccine. The vaccine will not protect against colds or other illnesses that may cause fever. The vaccine is needed every year. What side effects may I notice from receiving this medicine? Side effects that you should report to your doctor or health care professional as soon as possible:  allergic reactions like skin rash, itching or hives, swelling of the face, lips, or tongue Side effects that usually do not require medical attention (report to your doctor or health care professional if they continue or are bothersome):  fever  headache  muscle aches and pains  pain, tenderness, redness, or swelling at the injection site  tiredness This list may not describe all possible side effects. Call your doctor for medical advice about side effects. You may report side effects to FDA at 1-800-FDA-1088. Where should I keep my medicine? The vaccine will be given by a health care professional in a clinic, pharmacy, doctor's office, or other health care setting. You will not be given vaccine doses to store at home. NOTE: This sheet is a summary. It may not cover all possible information. If you have questions about this medicine, talk to your doctor, pharmacist, or health care provider.  2020 Elsevier/Gold Standard (2018-02-23 08:45:43)  Coronavirus (COVID-19) Are you at risk?  Are you at risk for the Coronavirus (COVID-19)?  To be considered HIGH RISK for Coronavirus (COVID-19), you have to meet the following criteria:  . Traveled to [place], [place], [place], [place] or [place]; or in the [place] to [place], [place], [place], or [place]; and have fever, cough, and shortness of breath within the last 2 weeks of travel OR . Been in close contact with a person diagnosed with COVID-19 within the last 2 weeks and have fever,  cough, and shortness of breath . IF YOU DO NOT MEET THESE CRITERIA, YOU ARE CONSIDERED LOW RISK FOR COVID-19.  What to do if you are HIGH RISK for COVID-19?  Marland Kitchen If you are having a medical emergency, call 911. . Seek medical care right away. Before you go to a doctor's office, urgent care or emergency department, call ahead and tell them about your recent travel, contact with someone diagnosed with COVID-19, and your symptoms. You should receive instructions from your physician's office regarding next steps of care.  . When you arrive at healthcare provider, tell the healthcare staff immediately you have returned from visiting [place], [place], [place], [place] or [place]; or traveled in the [place] to [place], [place], [place], or [place]; in the last two weeks or you have been in close contact with a person diagnosed with COVID-19 in the last 2 weeks.   . Tell the health care staff about your symptoms: fever, cough and shortness of breath. . After you have been seen by a medical provider, you will be either: o Tested for (COVID-19) and discharged home on quarantine except to seek medical care if symptoms worsen, and asked to  - Stay home and avoid contact with others until you get your results (4-5 days)  - Avoid travel on public transportation if possible (such as bus, train, or airplane) or o [employer]  to the Emergency Department by EMS for evaluation, COVID-19 testing, and possible admission depending on your condition and test results.  What to do if you are LOW RISK for COVID-19?  Reduce your risk of any infection by using the same precautions used for avoiding the common cold or flu:  Marland Kitchen Wash your hands often with soap and warm water for at least 20 seconds.  If soap and water are not readily available, use an alcohol-based hand sanitizer with at least 60% alcohol.  . If coughing or sneezing, cover your mouth and nose by coughing or sneezing into the elbow areas of your shirt or coat,  into a tissue or into your sleeve (not your hands). . Avoid shaking hands with others and consider head nods or verbal greetings only. . Avoid touching your eyes, nose, or mouth with unwashed hands.  . Avoid close contact with people who are sick. . Avoid places or events with large numbers of people in one location, like concerts or sporting events. . Carefully consider travel plans you have or are making. . If you are planning any travel outside or inside the [place], visit the CDC's Travelers' Health webpage for the latest health notices. . If you have some symptoms but not all symptoms, continue to monitor at home and seek medical attention if your symptoms worsen. . If you are having a medical emergency, call 911.   [place] / e-Visit: [email or web address]         [facility]: [facility]: [phone]                   [facility]: [phone]

## 2019-01-20 NOTE — Telephone Encounter (Signed)
I left a message with Jonetta Osgood the patient's daughter.  I told her that we have her mom's cell phone in the infusion room.

## 2019-01-20 NOTE — Progress Notes (Signed)
Per Cassie Heilingoetter, PA ok to treat with current heart rate.  Infusion nurse aware.  Oxygen was rechecked and reported at 94% on 2 liters of oxygen   Vitals:   01/20/19 1123 01/20/19 1124  BP: (!) 144/84   Pulse: (!) 111   Resp: (!) 22   Temp: 99.2 F (37.3 C)   SpO2: (!) 84% 94%

## 2019-01-31 ENCOUNTER — Other Ambulatory Visit: Payer: Self-pay

## 2019-01-31 ENCOUNTER — Ambulatory Visit (HOSPITAL_COMMUNITY)
Admission: RE | Admit: 2019-01-31 | Discharge: 2019-01-31 | Disposition: A | Discharge status: Home / Self Care | Payer: Medicare HMO | Source: Ambulatory Visit | Attending: Physician Assistant | Admitting: Physician Assistant

## 2019-01-31 DIAGNOSIS — C3491 Malignant neoplasm of unspecified part of right bronchus or lung: Secondary | ICD-10-CM | POA: Insufficient documentation

## 2019-01-31 MED ORDER — GADOBUTROL 1 MMOL/ML IV SOLN
5.0000 mL | Freq: Once | INTRAVENOUS | Status: AC | PRN
  Administered 2019-01-31: 5 mL via INTRAVENOUS

## 2019-02-17 ENCOUNTER — Inpatient Hospital Stay: Payer: Medicare HMO

## 2019-02-17 ENCOUNTER — Inpatient Hospital Stay: Payer: Medicare HMO | Attending: Oncology

## 2019-02-17 ENCOUNTER — Inpatient Hospital Stay (HOSPITAL_BASED_OUTPATIENT_CLINIC_OR_DEPARTMENT_OTHER): Payer: Medicare HMO | Admitting: Physician Assistant

## 2019-02-17 ENCOUNTER — Other Ambulatory Visit: Payer: Self-pay

## 2019-02-17 ENCOUNTER — Encounter: Payer: Self-pay | Admitting: Physician Assistant

## 2019-02-17 VITALS — BP 115/78 | HR 100 | Temp 98.5°F | Resp 18 | Ht 67.0 in | Wt 137.1 lb

## 2019-02-17 DIAGNOSIS — C3490 Malignant neoplasm of unspecified part of unspecified bronchus or lung: Secondary | ICD-10-CM

## 2019-02-17 DIAGNOSIS — R519 Headache, unspecified: Secondary | ICD-10-CM | POA: Diagnosis not present

## 2019-02-17 DIAGNOSIS — Z79899 Other long term (current) drug therapy: Secondary | ICD-10-CM | POA: Insufficient documentation

## 2019-02-17 DIAGNOSIS — C3491 Malignant neoplasm of unspecified part of right bronchus or lung: Secondary | ICD-10-CM | POA: Diagnosis not present

## 2019-02-17 DIAGNOSIS — Z5112 Encounter for antineoplastic immunotherapy: Secondary | ICD-10-CM

## 2019-02-17 DIAGNOSIS — C342 Malignant neoplasm of middle lobe, bronchus or lung: Secondary | ICD-10-CM | POA: Diagnosis present

## 2019-02-17 DIAGNOSIS — G8929 Other chronic pain: Secondary | ICD-10-CM

## 2019-02-17 LAB — CMP (CANCER CENTER ONLY)
ALT: 26 U/L (ref 0–44)
AST: 27 U/L (ref 15–41)
Albumin: 3.9 g/dL (ref 3.5–5.0)
Alkaline Phosphatase: 373 U/L — ABNORMAL HIGH (ref 38–126)
Anion gap: 13 (ref 5–15)
BUN: 22 mg/dL (ref 8–23)
CO2: 24 mmol/L (ref 22–32)
Calcium: 9.3 mg/dL (ref 8.9–10.3)
Chloride: 107 mmol/L (ref 98–111)
Creatinine: 0.82 mg/dL (ref 0.44–1.00)
GFR, Est AFR Am: >60 mL/min (ref >60)
GFR, Estimated: >60 mL/min (ref >60)
Glucose, Bld: 71 mg/dL (ref 70–99)
Potassium: 3.7 mmol/L (ref 3.5–5.1)
Sodium: 144 mmol/L (ref 135–145)
Total Bilirubin: <0.2 mg/dL — ABNORMAL LOW (ref 0.3–1.2)
Total Protein: 7.3 g/dL (ref 6.5–8.1)

## 2019-02-17 LAB — CBC WITH DIFFERENTIAL (CANCER CENTER ONLY)
Abs Immature Granulocytes: 0.02 10*3/uL (ref 0.00–0.07)
Basophils Absolute: 0 10*3/uL (ref 0.0–0.1)
Basophils Relative: 1 %
Eosinophils Absolute: 0.1 10*3/uL (ref 0.0–0.5)
Eosinophils Relative: 1 %
HCT: 38.9 % (ref 36.0–46.0)
Hemoglobin: 13.3 g/dL (ref 12.0–15.0)
Immature Granulocytes: 0 %
Lymphocytes Relative: 24 %
Lymphs Abs: 1.4 10*3/uL (ref 0.7–4.0)
MCH: 28.4 pg (ref 26.0–34.0)
MCHC: 34.2 g/dL (ref 30.0–36.0)
MCV: 83.1 fL (ref 80.0–100.0)
Monocytes Absolute: 0.4 10*3/uL (ref 0.1–1.0)
Monocytes Relative: 7 %
Neutro Abs: 3.8 10*3/uL (ref 1.7–7.7)
Neutrophils Relative %: 67 %
Platelet Count: 271 10*3/uL (ref 150–400)
RBC: 4.68 MIL/uL (ref 3.87–5.11)
RDW: 15.8 % — ABNORMAL HIGH (ref 11.5–15.5)
WBC Count: 5.7 10*3/uL (ref 4.0–10.5)
nRBC: 0 % (ref 0.0–0.2)

## 2019-02-17 LAB — TSH: TSH: 1.494 u[IU]/mL (ref 0.308–3.960)

## 2019-02-17 MED ORDER — SODIUM CHLORIDE 0.9 % IV SOLN
480.0000 mg | Freq: Once | INTRAVENOUS | Status: AC
  Administered 2019-02-17: 480 mg via INTRAVENOUS
  Filled 2019-02-17: qty 48

## 2019-02-17 MED ORDER — SODIUM CHLORIDE 0.9 % IV SOLN
Freq: Once | INTRAVENOUS | Status: AC
  Administered 2019-02-17: 11:00:00 via INTRAVENOUS
  Filled 2019-02-17: qty 250

## 2019-02-17 NOTE — Progress Notes (Signed)
[facility] OFFICE PROGRESS NOTE  Barry Dienes, NP No address on file  DIAGNOSIS: Recurrent non-small cell lung cancer initially diagnosed as stage IIIA (T1b, N2, M0) non-small cell lung cancer presented with right middle lobe pulmonary nodule, mediastinal lymphadenopathy and highly suspicious for small nodule in the left upper lobe that could change her stage to stage IV that could present another synchronous primary lesion in the left upper lobe. This was diagnosed in September 2017.  PRIOR THERAPY: 1) Concurrent chemoradiation with weekly carboplatin for AUC of 2 and paclitaxel 45 MG/M2 status post 6 cycles last dose was given 02/25/2016 with partial response. 2) Consolidation chemotherapy with carboplatin for AUC of 5 and paclitaxel 175 MG/M2 every 3 weeks with Neulasta support. First dose 04/22/2016. Status post 3 cycles.  CURRENT THERAPY: Second line treatment with immunotherapy with Nivolumab 480 mg IV every 4 weeks status post 23cycles  INTERVAL HISTORY: Courtney Zuniga 68 y.o. female returns  to the clinic for a follow up visit. The patient is feeling well today without any concerning complaints except for her baseline shortness of breath with exertion and chronic cough secondary to her COPD for which she is on oxygen via nasal cannula at home. The patient continues to tolerate treatment with nivolumab well without any adverse effects. Denies any fever, chills, night sweats, or weight loss. Denies any chest pain or hemoptysis. Denies any nausea, vomiting, diarrhea, or constipation.  Denies any rashes or skin changes. At her last visit, she endorsed her persistent chronic headache. She has a history of chronic headaches but she stated that her current headache was different. She took [facility] power today for her headache. She had a history of a brain tumor in 2005. She had a brain MRI recently to further assess her headache to make sure their was no metastatic disease to the brain. The  patient is here today for evaluation prior to starting cycle # 24    MEDICAL HISTORY: Past Medical History:  Diagnosis Date  . Anemia    as a young woman  . Arthritis    osteoartritis  . Asthma   . Brain tumor (benign) ([place]) 2005 [facility]   Benign  . Chronic headaches   . Chronic hip pain   . Chronic pain   . COPD (chronic obstructive pulmonary disease) ([place])   . Coronary artery disease   . Depression   . Depression 05/15/2016  . Encounter for antineoplastic chemotherapy 01/10/2016  . GERD (gastroesophageal reflux disease)   . Hypertension   . Lung cancer ([place]) dx'd 01/2016   currently on chemo and radiation   . NSTEMI (non-ST elevated myocardial infarction) ([place]) yrs ago  . On home O2    qhs 2 liters at hs and prn  . Pneumonia last time 2 yrs ago  . Shortness of breath dyspnea    with activity    ALLERGIES:  has No Known Allergies.  MEDICATIONS:  Current Outpatient Medications  Medication Sig Dispense Refill  . acetaminophen (TYLENOL) 325 MG tablet Take 2 tablets (650 mg total) by mouth every 6 (six) hours as needed for mild pain, fever or headache. 12 tablet 2  . albuterol (PROVENTIL HFA;VENTOLIN HFA) 108 (90 BASE) MCG/ACT inhaler Inhale 2 puffs into the lungs every 4 (four) hours as needed for shortness of breath.     Marland Kitchen BREO ELLIPTA 100-25 MCG/INH AEPB Inhale 1 puff into the lungs daily.     . Cholecalciferol (VITAMIN D3) 125 MCG (5000 UT) CAPS Take 1 capsule  by mouth daily.  0  . cyclobenzaprine (FLEXERIL) 10 MG tablet Take 1 tablet (10 mg total) by mouth 2 (two) times daily. *MAy take one additional tablet as needed for muscle spasms 60 tablet 2  . dextromethorphan-guaiFENesin (MUCINEX DM) 30-600 MG 12hr tablet Take 1 tablet by mouth 2 (two) times daily. 40 tablet 0  . diclofenac sodium (VOLTAREN) 1 % GEL Apply topically 4 (four) times daily as needed (massge gel into affected area(s) as needed for pain).   1  . ENSURE (ENSURE) Take 237 mLs by mouth 3 (three) times  daily between meals.    Marland Kitchen ipratropium-albuterol (DUONEB) 0.5-2.5 (3) MG/3ML SOLN Take 3 mLs by nebulization 3 (three) times daily. 360 mL 2  . metoprolol tartrate (LOPRESSOR) 25 MG tablet Take 1 tablet (25 mg total) by mouth 2 (two) times daily. 60 tablet 1  . nicotine (NICODERM CQ - DOSED IN MG/24 HOURS) 14 mg/24hr patch Place 1 patch (14 mg total) onto the skin daily. 28 patch 0  . oseltamivir (TAMIFLU) 75 MG capsule Take 1 capsule (75 mg total) by mouth 2 (two) times daily. 6 capsule 0  . oxyCODONE-acetaminophen (PERCOCET/ROXICET) 5-325 MG tablet Take 1 tablet by mouth every 8 (eight) hours as needed for moderate pain. 12 tablet 0  . pantoprazole (PROTONIX) 40 MG tablet Take 1 tablet (40 mg total) by mouth daily. 30 tablet 3  . ROBITUSSIN 12 HOUR COUGH 30 MG/5ML liquid Take 30 mg by mouth every 4 (four) hours as needed for cough.     . topiramate (TOPAMAX) 25 MG tablet Take 1 tablet by mouth daily at bedtime X 1 week; then increase to 25mg  BID. 45 tablet 2   No current facility-administered medications for this visit.     SURGICAL HISTORY:  Past Surgical History:  Procedure Laterality Date  . CHOLECYSTECTOMY    . COLONOSCOPY  2015   Results requested from [facility]  . COLONOSCOPY    . ESOPHAGOGASTRODUODENOSCOPY N/A 08/14/2015   Procedure: ESOPHAGOGASTRODUODENOSCOPY (EGD);  Surgeon: Daneil Dolin, MD;  Location: [facility];  Service: Endoscopy;  Laterality: N/A;  215   . ESOPHAGOGASTRODUODENOSCOPY (EGD) WITH PROPOFOL N/A 09/13/2015   Procedure: ESOPHAGOGASTRODUODENOSCOPY (EGD) WITH PROPOFOL;  Surgeon: Milus Banister, MD;  Location: [facility];  Service: Endoscopy;  Laterality: N/A;  . EUS N/A 03/12/2017   Procedure: UPPER ENDOSCOPIC ULTRASOUND (EUS) RADIAL;  Surgeon: Milus Banister, MD;  Location: [facility];  Service: Endoscopy;  Laterality: N/A;  . TUMOR REMOVAL  2005   Benign  . UPPER ESOPHAGEAL ENDOSCOPIC ULTRASOUND (EUS)  09/13/2015   Procedure: UPPER ESOPHAGEAL  ENDOSCOPIC ULTRASOUND (EUS);  Surgeon: Milus Banister, MD;  Location: [facility];  Service: Endoscopy;;  . VIDEO BRONCHOSCOPY WITH ENDOBRONCHIAL NAVIGATION N/A 12/31/2015   Procedure: VIDEO BRONCHOSCOPY WITH ENDOBRONCHIAL NAVIGATION;  Surgeon: Melrose Nakayama, MD;  Location: [facility];  Service: Thoracic;  Laterality: N/A;  . VIDEO BRONCHOSCOPY WITH ENDOBRONCHIAL ULTRASOUND N/A 11/08/2015   Procedure: VIDEO BRONCHOSCOPY WITH ENDOBRONCHIAL ULTRASOUND;  Surgeon: Ivin Poot, MD;  Location: [facility];  Service: Thoracic;  Laterality: N/A;    REVIEW OF SYSTEMS:   Constitutional: Negative for appetite change, chills, fatigue, fever and unexpected weight change.  HENT: Negative for mouth sores, nosebleeds, sore throat and trouble swallowing.  Eyes: Negative for eye problems and icterus.  Respiratory: Positive for shortness of breath with exertion and chronic cough. Negative for hemoptysis, and wheezing.  Cardiovascular: Negative for chest pain and leg swelling.  Gastrointestinal: Negative  for abdominal pain, constipation, diarrhea, nausea and vomiting.  Genitourinary: Negative for bladder incontinence, difficulty urinating, dysuria, frequency and hematuria.  Musculoskeletal: Negative for back pain, gait problem, neck pain and neck stiffness.  Skin: Negative for itching and rash.  Neurological:Positive for chronic headache and chronic facial asymmetry secondary to her history of a brain tumor in 2005.Negative for dizziness, extremity weakness, gait problem, light-headedness and seizures.  Hematological: Negative for adenopathy. Does not bruise/bleed easily.  Psychiatric/Behavioral: Negative for confusion, depression and sleep disturbance. The patient is not nervous/anxious.     PHYSICAL EXAMINATION:  There were no vitals taken for this visit.  ECOG PERFORMANCE STATUS: 1 - Symptomatic but completely ambulatory  Physical Exam  Constitutional: Oriented to person, place, and time and  well-developed, well-nourished, and in no distress.  HENT:  Head: Normocephalic and atraumatic.  Mouth/Throat: Oropharynx is clear and moist. No oropharyngeal exudate.  Eyes: Chronic twitching left eyelid secondary to hx of brain tumor per patient report. Conjunctivae are normal. Right eye exhibits no discharge. Left eye exhibits no discharge. No scleral icterus.  Neck: Normal range of motion. Neck supple.  Cardiovascular: Normal rate, regular rhythm, normal heart sounds and intact distal pulses.  Pulmonary/Chest: Effort normal. Quiet breath sounds in all lung fields. No respiratory distress. No wheezes. No rales.  Abdominal: Soft. Bowel sounds are normal. Exhibits no distension and no mass. There is no tenderness.  Musculoskeletal: Normal range of motion. Exhibits no edema.  Lymphadenopathy:  No cervical adenopathy.  Neurological: Left facial twitching and weakness (baseline from brain tumor in 2005).Alert and oriented to person, place, and time. Exhibits normal muscle tone. Gait normal. Coordination normal.  Skin: Skin is warm and dry. No rash noted. Not diaphoretic. No erythema. No pallor.  Psychiatric: Mood, memory and judgment normal.  Vitals reviewed.  LABORATORY DATA: Lab Results  Component Value Date   WBC 9.8 01/20/2019   HGB 12.8 01/20/2019   HCT 37.5 01/20/2019   MCV 82.1 01/20/2019   PLT 186 01/20/2019      Chemistry      Component Value Date/Time   NA 144 01/20/2019 1019   NA 144 04/09/2017 0948   K 3.6 01/20/2019 1019   K 4.3 04/09/2017 0948   CL 106 01/20/2019 1019   CO2 27 01/20/2019 1019   CO2 25 04/09/2017 0948   BUN 14 01/20/2019 1019   BUN 16.8 04/09/2017 0948   CREATININE 0.69 01/20/2019 1019   CREATININE 0.8 04/09/2017 0948      Component Value Date/Time   CALCIUM 9.5 01/20/2019 1019   CALCIUM 9.1 04/09/2017 0948   ALKPHOS 300 (H) 01/20/2019 1019   ALKPHOS 216 (H) 04/09/2017 0948   AST 24 01/20/2019 1019   AST 12 04/09/2017 0948   ALT 28  01/20/2019 1019   ALT 13 04/09/2017 0948   BILITOT 0.3 01/20/2019 1019   BILITOT 0.22 04/09/2017 0948       RADIOGRAPHIC STUDIES:  Ct Chest W Contrast  Result Date: 01/18/2019 CLINICAL DATA:  Restaging lung cancer. EXAM: CT CHEST, ABDOMEN, AND PELVIS WITH CONTRAST TECHNIQUE: Multidetector CT imaging of the chest, abdomen and pelvis was performed following the standard protocol during bolus administration of intravenous contrast. CONTRAST:  123mL OMNIPAQUE IOHEXOL 300 MG/ML  SOLN COMPARISON:  10/22/2018 FINDINGS: CT CHEST FINDINGS Cardiovascular: Normal heart size. No pericardial effusion identified. Aortic atherosclerosis. Lad and RCA coronary artery atherosclerotic calcifications. Mediastinum/Nodes: Normal appearance of the thyroid gland. The trachea appears patent and is midline. Normal appearance of the esophagus. No mediastinal  or hilar adenopathy. Lungs/Pleura: No pleural effusion. Advanced centrilobular and paraseptal emphysema. Masslike architectural distortion and fibrosis within the right middle lobe are again identified, image 101/6. The appearance is stable when compared with the previous exam and is favored to represent changes secondary to radiation. Nodular area within the posterior right lower lobe is again noted and appears unchanged in appearance measuring 1.6 x 1.0 cm, image 105/6. Scar with nodularity in the medial right upper lobe is stable, image 38/6. 4 mm nodular density within the anteromedial left upper lobe is again noted. Not significantly changed in the interval. No new pulmonary nodules or masses identified Musculoskeletal: The bones appear diffusely osteopenic. No aggressive lytic or sclerotic bone lesions identified. CT ABDOMEN PELVIS FINDINGS Hepatobiliary: No focal liver abnormality is seen. Previous cholecystectomy with chronic increase caliber of the common bile duct. Pancreas: Unremarkable. No pancreatic ductal dilatation or surrounding inflammatory changes. Spleen:  Normal in size without focal abnormality. Adrenals/Urinary Tract: Normal appearance of the adrenal glands. The kidneys are unremarkable. No mass or hydronephrosis. Urinary bladder appears normal. Stomach/Bowel: The stomach and the small bowel loops are unremarkable. The appendix is visualized and appears normal. Colonic diverticula noted. No inflammation. Vascular/Lymphatic: Aortic atherosclerosis. No aneurysm. No abdominopelvic adenopathy identified. Reproductive: Enlarged partially calcified fibroid uterus is again noted. No adnexal mass. Other: No free fluid or fluid collections within the abdomen or pelvis. Musculoskeletal: No acute or significant osseous findings. IMPRESSION: 1. Stable exam. Similar appearance of masslike architectural distortion and fibrosis within the right middle lobe compatible with changes due to external beam radiation. 2. Unchanged appearance of nodular density within the posterior right lower lobe, nonspecific. Continued attention on follow-up imaging advised. 3. No new or progressive disease identified. 4. Aortic Atherosclerosis (ICD10-I70.0) and Emphysema (ICD10-J43.9). 5. Coronary artery atherosclerotic calcifications. Electronically Signed   By: Kerby Moors M.D.   On: 01/18/2019 11:16   Mr Jeri Cos ZC Contrast  Result Date: 01/31/2019 CLINICAL DATA:  Lung cancer with worsening headaches. Weakness and confusion. EXAM: MRI HEAD WITHOUT AND WITH CONTRAST TECHNIQUE: Multiplanar, multiecho pulse sequences of the brain and surrounding structures were obtained without and with intravenous contrast. CONTRAST:  93mL GADAVIST GADOBUTROL 1 MMOL/ML IV SOLN COMPARISON:  05/11/2017 FINDINGS: Brain: There is no evidence of acute infarct, intracranial hemorrhage, mass, midline shift, or extra-axial fluid collection. Patchy T2 hyperintensities in the cerebral white matter bilaterally have not significantly changed and are nonspecific but compatible with moderate chronic small vessel ischemic  disease. Mild cerebral atrophy is unchanged and within normal limits for age. No abnormal brain parenchymal or meningeal enhancement is identified. Vascular: Major intracranial vascular flow voids are preserved. 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 Sinuses/Orbits: Unremarkable orbits. Clear paranasal sinuses. Unchanged chronic right mastoid effusion. Other: None. IMPRESSION: 1. No evidence of intracranial metastases or acute intracranial abnormality. 2. Moderate chronic small vessel ischemic disease. Electronically Signed   By: [place] Bores M.D.   On: 01/31/2019 17:38   Ct Abdomen Pelvis W Contrast  Result Date: 01/18/2019 CLINICAL DATA:  Restaging lung cancer. EXAM: CT CHEST, ABDOMEN, AND PELVIS WITH CONTRAST TECHNIQUE: Multidetector CT imaging of the chest, abdomen and pelvis was performed following the standard protocol during bolus administration of intravenous contrast. CONTRAST:  142mL OMNIPAQUE IOHEXOL 300 MG/ML  SOLN COMPARISON:  10/22/2018 FINDINGS: CT CHEST FINDINGS Cardiovascular: Normal heart size. No pericardial effusion identified. Aortic atherosclerosis. Lad and RCA  coronary artery atherosclerotic calcifications. Mediastinum/Nodes: Normal appearance of the thyroid gland. The trachea appears patent and is midline. Normal appearance of the esophagus. No mediastinal or hilar adenopathy. Lungs/Pleura: No pleural effusion. Advanced centrilobular and paraseptal emphysema. Masslike architectural distortion and fibrosis within the right middle lobe are again identified, image 101/6. The appearance is stable when compared with the previous exam and is favored to represent changes secondary to radiation. Nodular area within the posterior right lower lobe is again noted and appears unchanged in appearance measuring 1.6 x 1.0 cm, image 105/6. Scar with  nodularity in the medial right upper lobe is stable, image 38/6. 4 mm nodular density within the anteromedial left upper lobe is again noted. Not significantly changed in the interval. No new pulmonary nodules or masses identified Musculoskeletal: The bones appear diffusely osteopenic. No aggressive lytic or sclerotic bone lesions identified. CT ABDOMEN PELVIS FINDINGS Hepatobiliary: No focal liver abnormality is seen. Previous cholecystectomy with chronic increase caliber of the common bile duct. Pancreas: Unremarkable. No pancreatic ductal dilatation or surrounding inflammatory changes. Spleen: Normal in size without focal abnormality. Adrenals/Urinary Tract: Normal appearance of the adrenal glands. The kidneys are unremarkable. No mass or hydronephrosis. Urinary bladder appears normal. Stomach/Bowel: The stomach and the small bowel loops are unremarkable. The appendix is visualized and appears normal. Colonic diverticula noted. No inflammation. Vascular/Lymphatic: Aortic atherosclerosis. No aneurysm. No abdominopelvic adenopathy identified. Reproductive: Enlarged partially calcified fibroid uterus is again noted. No adnexal mass. Other: No free fluid or fluid collections within the abdomen or pelvis. Musculoskeletal: No acute or significant osseous findings. IMPRESSION: 1. Stable exam. Similar appearance of masslike architectural distortion and fibrosis within the right middle lobe compatible with changes due to external beam radiation. 2. Unchanged appearance of nodular density within the posterior right lower lobe, nonspecific. Continued attention on follow-up imaging advised. 3. No new or progressive disease identified. 4. Aortic Atherosclerosis (ICD10-I70.0) and Emphysema (ICD10-J43.9). 5. Coronary artery atherosclerotic calcifications. Electronically Signed   By: Kerby Moors M.D.   On: 01/18/2019 11:16     ASSESSMENT/PLAN:  This is a very pleasant 68 year old African-American female with recurrent  metastatic non-small cell lung cancer, adenocarcinoma of the right middle lobe. She was initially diagnosed as stage IIIa in September 2017.   She completed concurrent chemoradiation followed by consolidation with carboplatin and paclitaxel. She is status post3 cycles. The patient was on observation but a restagingCTscan showed evidence of disease progression.  Sheis currently undergoing treatment withsecond line with immunotherapy with nivolumab 400 mg IV every 4 weeks. She is status post 23 cycles.   The patient recently had a brain MRI to further evaluate her headaches. Dr. Julien Nordmann personally and independently reviewed her scan and discussed the results with the patient today.  The patient's MRI not show any evidence of metastatic disease to the brain contributing to her chronic headaches.  Dr. Julien Nordmann recommends the patient continue on the same treatment.  She will receive cycle #24 today as scheduled. She will continue to follow with her primary doctor regarding her chronic headaches.  We will see the patient back for follow-up visit in 4 weeks for evaluation before starting cycle #25.  The patient was advised to call immediately if she has any concerning symptoms in the interval. The patient voices understanding of current disease status and treatment options and is in agreement with the current care plan. All questions were answered. The patient knows to call the clinic with any problems, questions or concerns. We can certainly see the patient much  sooner if necessary  No orders of the defined types were placed in this encounter.    Cassandra L Heilingoetter, PA-C 02/17/19  ADDENDUM: Hematology/Oncology Attending: I had a face-to-face encounter with the patient today.  I recommended her care plan.  This is a very pleasant 68 years old African-American female with recurrent non-small cell lung cancer, adenocarcinoma and she is currently undergoing treatment with nivolumab 400  mg IV every 4 weeks status post 23 cycles.  The patient has been tolerating this treatment well with no concerning adverse effects.  She has been complaining of recurrent headache and we ordered MRI of the brain recently.  Her MRI showed no concerning findings for disease metastasis to the brain. I recommended for the patient to continue her current treatment with nivolumab with the same dose. We will see her back for follow-up visit in 4 weeks for evaluation before the next cycle of her treatment. She was advised to call immediately if she has any concerning symptoms in the interval.  Disclaimer: This note was dictated with voice recognition software. Similar sounding words can inadvertently be transcribed and may be missed upon review. Eilleen Kempf, MD 02/17/19

## 2019-02-17 NOTE — Patient Instructions (Signed)
[facility] Discharge Instructions for Patients Receiving Chemotherapy  Today you received the following chemotherapy agents Nivolumab (OPDIVO).  To help prevent nausea and vomiting after your treatment, we encourage you to take your nausea medication as prescribed.   If you develop nausea and vomiting that is not controlled by your nausea medication, call the clinic.   BELOW ARE SYMPTOMS THAT SHOULD BE REPORTED IMMEDIATELY:  *FEVER GREATER THAN 100.5 F  *CHILLS WITH OR WITHOUT FEVER  NAUSEA AND VOMITING THAT IS NOT CONTROLLED WITH YOUR NAUSEA MEDICATION  *UNUSUAL SHORTNESS OF BREATH  *UNUSUAL BRUISING OR BLEEDING  TENDERNESS IN MOUTH AND THROAT WITH OR WITHOUT PRESENCE OF ULCERS  *URINARY PROBLEMS  *BOWEL PROBLEMS  UNUSUAL RASH Items with * indicate a potential emergency and should be followed up as soon as possible.  Feel free to call the clinic should you have any questions or concerns. The clinic phone number is [phone].  Please show the [place] [facility] at check-in to the Emergency Department and triage nurse.  Influenza Virus Vaccine injection What is this medicine? INFLUENZA VIRUS VACCINE (in floo EN zuh VAHY ruhs vak SEEN) helps to reduce the risk of getting influenza also known as the flu. The vaccine only helps protect you against some strains of the flu. This medicine may be used for other purposes; ask your health care provider or pharmacist if you have questions. COMMON BRAND NAME(S): Afluria, Afluria Quadrivalent, Agriflu, Alfuria, FLUAD, Fluarix, Fluarix Quadrivalent, Flublok, Flublok Quadrivalent, FLUCELVAX, Flulaval, Fluvirin, Fluzone, Fluzone High-Dose, Fluzone Intradermal What should I tell my health care provider before I take this medicine? They need to know if you have any of these conditions:  bleeding disorder like hemophilia  fever or infection  Guillain-Barre syndrome or other neurological problems  immune system  problems  infection with the human immunodeficiency virus (HIV) or AIDS  low blood platelet counts  multiple sclerosis  an unusual or allergic reaction to influenza virus vaccine, latex, other medicines, foods, dyes, or preservatives. Different brands of vaccines contain different allergens. Some may contain latex or eggs. Talk to your doctor about your allergies to make sure that you get the right vaccine.  pregnant or trying to get pregnant  breast-feeding How should I use this medicine? This vaccine is for injection into a muscle or under the skin. It is given by a health care professional. A copy of Vaccine Information Statements will be given before each vaccination. Read this sheet carefully each time. The sheet may change frequently. Talk to your healthcare provider to see which vaccines are right for you. Some vaccines should not be used in all age groups. Overdosage: If you think you have taken too much of this medicine contact a poison control center or emergency room at once. NOTE: This medicine is only for you. Do not share this medicine with others. What if I miss a dose? This does not apply. What may interact with this medicine?  chemotherapy or radiation therapy  medicines that lower your immune system like etanercept, anakinra, infliximab, and adalimumab  medicines that treat or prevent blood clots like warfarin  phenytoin  steroid medicines like prednisone or cortisone  theophylline  vaccines This list may not describe all possible interactions. Give your health care provider a list of all the medicines, herbs, non-prescription drugs, or dietary supplements you use. Also tell them if you smoke, drink alcohol, or use illegal drugs. Some items may interact with your medicine. What should I watch for while using  this medicine? Report any side effects that do not go away within 3 days to your doctor or health care professional. Call your health care provider if any  unusual symptoms occur within 6 weeks of receiving this vaccine. You may still catch the flu, but the illness is not usually as bad. You cannot get the flu from the vaccine. The vaccine will not protect against colds or other illnesses that may cause fever. The vaccine is needed every year. What side effects may I notice from receiving this medicine? Side effects that you should report to your doctor or health care professional as soon as possible:  allergic reactions like skin rash, itching or hives, swelling of the face, lips, or tongue Side effects that usually do not require medical attention (report to your doctor or health care professional if they continue or are bothersome):  fever  headache  muscle aches and pains  pain, tenderness, redness, or swelling at the injection site  tiredness This list may not describe all possible side effects. Call your doctor for medical advice about side effects. You may report side effects to FDA at 1-800-FDA-1088. Where should I keep my medicine? The vaccine will be given by a health care professional in a clinic, pharmacy, doctor's office, or other health care setting. You will not be given vaccine doses to store at home. NOTE: This sheet is a summary. It may not cover all possible information. If you have questions about this medicine, talk to your doctor, pharmacist, or health care provider.  2020 Elsevier/Gold Standard (2018-02-23 08:45:43)  Coronavirus (COVID-19) Are you at risk?  Are you at risk for the Coronavirus (COVID-19)?  To be considered HIGH RISK for Coronavirus (COVID-19), you have to meet the following criteria:  . Traveled to [place], [place], [place], [place] or [place]; or in the [place] to [place], [place], [place], or [place]; and have fever, cough, and shortness of breath within the last 2 weeks of travel OR . Been in close contact with a person diagnosed with COVID-19 within the last 2 weeks and have fever,  cough, and shortness of breath . IF YOU DO NOT MEET THESE CRITERIA, YOU ARE CONSIDERED LOW RISK FOR COVID-19.  What to do if you are HIGH RISK for COVID-19?  Marland Kitchen If you are having a medical emergency, call 911. . Seek medical care right away. Before you go to a doctor's office, urgent care or emergency department, call ahead and tell them about your recent travel, contact with someone diagnosed with COVID-19, and your symptoms. You should receive instructions from your physician's office regarding next steps of care.  . When you arrive at healthcare provider, tell the healthcare staff immediately you have returned from visiting [place], [place], [place], [place] or [place]; or traveled in the [place] to [place], [place], [place], or [place]; in the last two weeks or you have been in close contact with a person diagnosed with COVID-19 in the last 2 weeks.   . Tell the health care staff about your symptoms: fever, cough and shortness of breath. . After you have been seen by a medical provider, you will be either: o Tested for (COVID-19) and discharged home on quarantine except to seek medical care if symptoms worsen, and asked to  - Stay home and avoid contact with others until you get your results (4-5 days)  - Avoid travel on public transportation if possible (such as bus, train, or airplane) or o [employer]  to the Emergency Department by EMS for evaluation, COVID-19 testing, and possible admission depending on your condition and test results.  What to do if you are LOW RISK for COVID-19?  Reduce your risk of any infection by using the same precautions used for avoiding the common cold or flu:  Marland Kitchen Wash your hands often with soap and warm water for at least 20 seconds.  If soap and water are not readily available, use an alcohol-based hand sanitizer with at least 60% alcohol.  . If coughing or sneezing, cover your mouth and nose by coughing or sneezing into the elbow areas of your shirt or coat,  into a tissue or into your sleeve (not your hands). . Avoid shaking hands with others and consider head nods or verbal greetings only. . Avoid touching your eyes, nose, or mouth with unwashed hands.  . Avoid close contact with people who are sick. . Avoid places or events with large numbers of people in one location, like concerts or sporting events. . Carefully consider travel plans you have or are making. . If you are planning any travel outside or inside the [place], visit the CDC's Travelers' Health webpage for the latest health notices. . If you have some symptoms but not all symptoms, continue to monitor at home and seek medical attention if your symptoms worsen. . If you are having a medical emergency, call 911.   [place] / e-Visit: [email or web address]         [facility]: [facility]: [phone]                   [facility]: [phone]

## 2019-02-18 ENCOUNTER — Telehealth: Payer: Self-pay | Admitting: Internal Medicine

## 2019-02-18 NOTE — Telephone Encounter (Signed)
Scheduled per los. Called and spoke with patient. Confirmed appt 

## 2019-03-17 ENCOUNTER — Other Ambulatory Visit: Payer: Self-pay

## 2019-03-17 ENCOUNTER — Inpatient Hospital Stay: Payer: Medicare HMO | Attending: Oncology | Admitting: Internal Medicine

## 2019-03-17 ENCOUNTER — Encounter: Payer: Self-pay | Admitting: Internal Medicine

## 2019-03-17 ENCOUNTER — Inpatient Hospital Stay: Payer: Medicare HMO

## 2019-03-17 VITALS — HR 103

## 2019-03-17 VITALS — BP 142/94 | HR 108 | Temp 97.9°F | Resp 19 | Ht 67.0 in | Wt 143.1 lb

## 2019-03-17 DIAGNOSIS — C3490 Malignant neoplasm of unspecified part of unspecified bronchus or lung: Secondary | ICD-10-CM

## 2019-03-17 DIAGNOSIS — Z923 Personal history of irradiation: Secondary | ICD-10-CM

## 2019-03-17 DIAGNOSIS — C3491 Malignant neoplasm of unspecified part of right bronchus or lung: Secondary | ICD-10-CM

## 2019-03-17 DIAGNOSIS — Z9221 Personal history of antineoplastic chemotherapy: Secondary | ICD-10-CM

## 2019-03-17 DIAGNOSIS — I10 Essential (primary) hypertension: Secondary | ICD-10-CM

## 2019-03-17 DIAGNOSIS — Z5112 Encounter for antineoplastic immunotherapy: Secondary | ICD-10-CM | POA: Insufficient documentation

## 2019-03-17 DIAGNOSIS — Z79899 Other long term (current) drug therapy: Secondary | ICD-10-CM | POA: Diagnosis not present

## 2019-03-17 DIAGNOSIS — C342 Malignant neoplasm of middle lobe, bronchus or lung: Secondary | ICD-10-CM | POA: Insufficient documentation

## 2019-03-17 LAB — CMP (CANCER CENTER ONLY)
ALT: 39 U/L (ref 0–44)
AST: 41 U/L (ref 15–41)
Albumin: 3.8 g/dL (ref 3.5–5.0)
Alkaline Phosphatase: 333 U/L — ABNORMAL HIGH (ref 38–126)
Anion gap: 13 (ref 5–15)
BUN: 13 mg/dL (ref 8–23)
CO2: 28 mmol/L (ref 22–32)
Calcium: 9.3 mg/dL (ref 8.9–10.3)
Chloride: 105 mmol/L (ref 98–111)
Creatinine: 0.74 mg/dL (ref 0.44–1.00)
GFR, Est AFR Am: >60 mL/min (ref >60)
GFR, Estimated: >60 mL/min (ref >60)
Glucose, Bld: 74 mg/dL (ref 70–99)
Potassium: 3.8 mmol/L (ref 3.5–5.1)
Sodium: 146 mmol/L — ABNORMAL HIGH (ref 135–145)
Total Bilirubin: <0.2 mg/dL — ABNORMAL LOW (ref 0.3–1.2)
Total Protein: 7.1 g/dL (ref 6.5–8.1)

## 2019-03-17 LAB — CBC WITH DIFFERENTIAL (CANCER CENTER ONLY)
Abs Immature Granulocytes: 0.01 10*3/uL (ref 0.00–0.07)
Basophils Absolute: 0 10*3/uL (ref 0.0–0.1)
Basophils Relative: 1 %
Eosinophils Absolute: 0.1 10*3/uL (ref 0.0–0.5)
Eosinophils Relative: 1 %
HCT: 39.9 % (ref 36.0–46.0)
Hemoglobin: 13.5 g/dL (ref 12.0–15.0)
Immature Granulocytes: 0 %
Lymphocytes Relative: 30 %
Lymphs Abs: 2 10*3/uL (ref 0.7–4.0)
MCH: 28.4 pg (ref 26.0–34.0)
MCHC: 33.8 g/dL (ref 30.0–36.0)
MCV: 84 fL (ref 80.0–100.0)
Monocytes Absolute: 0.5 10*3/uL (ref 0.1–1.0)
Monocytes Relative: 7 %
Neutro Abs: 4.1 10*3/uL (ref 1.7–7.7)
Neutrophils Relative %: 61 %
Platelet Count: 225 10*3/uL (ref 150–400)
RBC: 4.75 MIL/uL (ref 3.87–5.11)
RDW: 15.7 % — ABNORMAL HIGH (ref 11.5–15.5)
WBC Count: 6.6 10*3/uL (ref 4.0–10.5)
nRBC: 0 % (ref 0.0–0.2)

## 2019-03-17 LAB — TSH: TSH: 5.249 u[IU]/mL — ABNORMAL HIGH (ref 0.308–3.960)

## 2019-03-17 MED ORDER — SODIUM CHLORIDE 0.9 % IV SOLN
Freq: Once | INTRAVENOUS | Status: AC
  Administered 2019-03-17: 13:00:00 via INTRAVENOUS
  Filled 2019-03-17: qty 250

## 2019-03-17 MED ORDER — SODIUM CHLORIDE 0.9 % IV SOLN
480.0000 mg | Freq: Once | INTRAVENOUS | Status: AC
  Administered 2019-03-17: 480 mg via INTRAVENOUS
  Filled 2019-03-17: qty 48

## 2019-03-17 NOTE — Patient Instructions (Signed)
[facility] Discharge Instructions for Patients Receiving Chemotherapy  Today you received the following chemotherapy agents Opdivo  To help prevent nausea and vomiting after your treatment, we encourage you to take your nausea medication as directed   If you develop nausea and vomiting that is not controlled by your nausea medication, call the clinic.   BELOW ARE SYMPTOMS THAT SHOULD BE REPORTED IMMEDIATELY:  *FEVER GREATER THAN 100.5 F  *CHILLS WITH OR WITHOUT FEVER  NAUSEA AND VOMITING THAT IS NOT CONTROLLED WITH YOUR NAUSEA MEDICATION  *UNUSUAL SHORTNESS OF BREATH  *UNUSUAL BRUISING OR BLEEDING  TENDERNESS IN MOUTH AND THROAT WITH OR WITHOUT PRESENCE OF ULCERS  *URINARY PROBLEMS  *BOWEL PROBLEMS  UNUSUAL RASH Items with * indicate a potential emergency and should be followed up as soon as possible.  Feel free to call the clinic should you have any questions or concerns. The clinic phone number is [phone].  Please show the CHEMO ALERT CARD at check-in to the Emergency Department and triage nurse.   

## 2019-03-17 NOTE — Progress Notes (Signed)
[facility] [phone]   [phone]  OFFICE PROGRESS NOTE  Barry Dienes, NP No address on file  DIAGNOSIS: Recurrent non-small cell lung cancer initially diagnosed as stage IIIA (T1b, N2, M0) non-small cell lung cancer presented with right middle lobe pulmonary nodule, mediastinal lymphadenopathy and highly suspicious for small nodule in the left upper lobe that could change her stage to stage IV that could present another synchronous primary lesion in the left upper lobe. This was diagnosed in September 2017.  PRIOR THERAPY:  1) Concurrent chemoradiation with weekly carboplatin for AUC of 2 and paclitaxel 45 MG/M2 status post 6 cycles last dose was given 02/25/2016 with partial response. 2) Consolidation chemotherapy with carboplatin for AUC of 5 and paclitaxel 175 MG/M2 every 3 weeks with Neulasta support. First dose 04/22/2016. Status post 3 cycles.  CURRENT THERAPY: Second line treatment with immunotherapy with Nivolumab 480 mg IV every 4 weeks status post 24 cycles..  INTERVAL HISTORY: Courtney Zuniga 68 y.o. female returns to the clinic today for follow-up visit.  The patient is feeling fine today with no concerning complaints.  She enjoyed her Thanksgiving holiday with her family.  She denied having any chest pain, shortness of breath, cough or hemoptysis.  She denied having any fever or chills.  She has no current headache or visual changes.  She has no weight loss or night sweats.  The patient continues to tolerate her treatment with immunotherapy fairly well.  She is here today for evaluation before starting cycle #25.  MEDICAL HISTORY: Past Medical History:  Diagnosis Date  . Anemia    as a young woman  . Arthritis    osteoartritis  . Asthma   . Brain tumor (benign) ([facility]) 2005 [facility]   Benign  . Chronic headaches   . Chronic hip pain   . Chronic pain   . COPD (chronic obstructive pulmonary disease) ([facility])   . Coronary artery disease    . Depression   . Depression 05/15/2016  . Encounter for antineoplastic chemotherapy 01/10/2016  . GERD (gastroesophageal reflux disease)   . Hypertension   . Lung cancer ([facility]) dx'd 01/2016   currently on chemo and radiation   . NSTEMI (non-ST elevated myocardial infarction) ([facility]) yrs ago  . On home O2    qhs 2 liters at hs and prn  . Pneumonia last time 2 yrs ago  . Shortness of breath dyspnea    with activity    ALLERGIES:  has No Known Allergies.  MEDICATIONS:  Current Outpatient Medications  Medication Sig Dispense Refill  . acetaminophen (TYLENOL) 325 MG tablet Take 2 tablets (650 mg total) by mouth every 6 (six) hours as needed for mild pain, fever or headache. 12 tablet 2  . albuterol (PROVENTIL HFA;VENTOLIN HFA) 108 (90 BASE) MCG/ACT inhaler Inhale 2 puffs into the lungs every 4 (four) hours as needed for shortness of breath.     Marland Kitchen BREO ELLIPTA 100-25 MCG/INH AEPB Inhale 1 puff into the lungs daily.     . Cholecalciferol (VITAMIN D3) 125 MCG (5000 UT) CAPS Take 1 capsule by mouth daily.  0  . cyclobenzaprine (FLEXERIL) 10 MG tablet Take 1 tablet (10 mg total) by mouth 2 (two) times daily. *MAy take one additional tablet as needed for muscle spasms 60 tablet 2  . dextromethorphan-guaiFENesin (MUCINEX DM) 30-600 MG 12hr tablet Take 1 tablet by mouth 2 (two) times daily. 40 tablet 0  . diclofenac sodium (VOLTAREN) 1 % GEL Apply  topically 4 (four) times daily as needed (massge gel into affected area(s) as needed for pain).   1  . ENSURE (ENSURE) Take 237 mLs by mouth 3 (three) times daily between meals.    Marland Kitchen ipratropium-albuterol (DUONEB) 0.5-2.5 (3) MG/3ML SOLN Take 3 mLs by nebulization 3 (three) times daily. 360 mL 2  . metoprolol tartrate (LOPRESSOR) 25 MG tablet Take 1 tablet (25 mg total) by mouth 2 (two) times daily. 60 tablet 1  . nicotine (NICODERM CQ - DOSED IN MG/24 HOURS) 14 mg/24hr patch Place 1 patch (14 mg total) onto the skin daily. 28 patch 0  . oseltamivir  (TAMIFLU) 75 MG capsule Take 1 capsule (75 mg total) by mouth 2 (two) times daily. 6 capsule 0  . oxyCODONE-acetaminophen (PERCOCET/ROXICET) 5-325 MG tablet Take 1 tablet by mouth every 8 (eight) hours as needed for moderate pain. 12 tablet 0  . pantoprazole (PROTONIX) 40 MG tablet Take 1 tablet (40 mg total) by mouth daily. 30 tablet 3  . ROBITUSSIN 12 HOUR COUGH 30 MG/5ML liquid Take 30 mg by mouth every 4 (four) hours as needed for cough.     . topiramate (TOPAMAX) 25 MG tablet Take 1 tablet by mouth daily at bedtime X 1 week; then increase to 25mg  BID. 45 tablet 2   No current facility-administered medications for this visit.     SURGICAL HISTORY:  Past Surgical History:  Procedure Laterality Date  . CHOLECYSTECTOMY    . COLONOSCOPY  2015   Results requested from [facility]  . COLONOSCOPY    . ESOPHAGOGASTRODUODENOSCOPY N/A 08/14/2015   Procedure: ESOPHAGOGASTRODUODENOSCOPY (EGD);  Surgeon: Daneil Dolin, MD;  Location: [facility];  Service: Endoscopy;  Laterality: N/A;  215   . ESOPHAGOGASTRODUODENOSCOPY (EGD) WITH PROPOFOL N/A 09/13/2015   Procedure: ESOPHAGOGASTRODUODENOSCOPY (EGD) WITH PROPOFOL;  Surgeon: Milus Banister, MD;  Location: [facility];  Service: Endoscopy;  Laterality: N/A;  . EUS N/A 03/12/2017   Procedure: UPPER ENDOSCOPIC ULTRASOUND (EUS) RADIAL;  Surgeon: Milus Banister, MD;  Location: [facility] END[facility];  Service: Endoscopy;  Laterality: N/A;  . TUMOR REMOVAL  2005   Benign  . UPPER ESOPHAGEAL ENDOSCOPIC ULTRASOUND (EUS)  09/13/2015   Procedure: UPPER ESOPHAGEAL ENDOSCOPIC ULTRASOUND (EUS);  Surgeon: Milus Banister, MD;  Location: [facility];  Service: Endoscopy;;  . VIDEO BRONCHOSCOPY WITH ENDOBRONCHIAL NAVIGATION N/A 12/31/2015   Procedure: VIDEO BRONCHOSCOPY WITH ENDOBRONCHIAL NAVIGATION;  Surgeon: Melrose Nakayama, MD;  Location: [facility];  Service: Thoracic;  Laterality: N/A;  . VIDEO BRONCHOSCOPY WITH ENDOBRONCHIAL ULTRASOUND N/A 11/08/2015    Procedure: VIDEO BRONCHOSCOPY WITH ENDOBRONCHIAL ULTRASOUND;  Surgeon: Ivin Poot, MD;  Location: [facility] OR;  Service: Thoracic;  Laterality: N/A;    REVIEW OF SYSTEMS:  A comprehensive review of systems was negative.   PHYSICAL EXAMINATION: General appearance: alert, cooperative and no distress Head: Normocephalic, without obvious abnormality, atraumatic Neck: no adenopathy, no JVD, supple, symmetrical, trachea midline and thyroid not enlarged, symmetric, no tenderness/mass/nodules Lymph nodes: Cervical, supraclavicular, and axillary nodes normal. Resp: clear to auscultation bilaterally Back: symmetric, no curvature. ROM normal. No CVA tenderness. Cardio: regular rate and rhythm, S1, S2 normal, no murmur, click, rub or gallop GI: soft, non-tender; bowel sounds normal; no masses,  no organomegaly Extremities: extremities normal, atraumatic, no cyanosis or edema  ECOG PERFORMANCE STATUS: 1 - Symptomatic but completely ambulatory  Blood pressure (!) 142/94, pulse (!) 108, temperature 97.9 F (36.6 C), temperature source Temporal, resp. rate 19, height 5\' 7"  (1.702 m),  weight 143 lb 1.6 oz (64.9 kg), SpO2 98 %.  LABORATORY DATA: Lab Results  Component Value Date   WBC 6.6 03/17/2019   HGB 13.5 03/17/2019   HCT 39.9 03/17/2019   MCV 84.0 03/17/2019   PLT 225 03/17/2019      Chemistry      Component Value Date/Time   NA 144 02/17/2019 0949   NA 144 04/09/2017 0948   K 3.7 02/17/2019 0949   K 4.3 04/09/2017 0948   CL 107 02/17/2019 0949   CO2 24 02/17/2019 0949   CO2 25 04/09/2017 0948   BUN 22 02/17/2019 0949   BUN 16.8 04/09/2017 0948   CREATININE 0.82 02/17/2019 0949   CREATININE 0.8 04/09/2017 0948      Component Value Date/Time   CALCIUM 9.3 02/17/2019 0949   CALCIUM 9.1 04/09/2017 0948   ALKPHOS 373 (H) 02/17/2019 0949   ALKPHOS 216 (H) 04/09/2017 0948   AST 27 02/17/2019 0949   AST 12 04/09/2017 0948   ALT 26 02/17/2019 0949   ALT 13 04/09/2017 0948   BILITOT  <0.2 (L) 02/17/2019 0949   BILITOT 0.22 04/09/2017 0948       RADIOGRAPHIC STUDIES:-   No results found.  ASSESSMENT AND PLAN:  This is a very pleasant 68 years old African-American female with a recurrent non-small cell lung cancer initially diagnosed as stage IIIA non-small cell lung cancer status post concurrent chemoradiation followed by consolidation chemotherapy with carboplatin and paclitaxel for 3 cycles. The patient was followed by observation but restaging imaging studies showed evidence for disease progression. She was started on second line treatment with immunotherapy with Nivolumab 480 mg IV every 4 weeks status post 24 cycles.   The patient has been tolerating this treatment well with no concerning adverse effects. I recommended for her to proceed with cycle #25 today as planned. I will see her back for follow-up visit in 4 weeks for evaluation after repeating CT scan of the chest, abdomen and pelvis for restaging of her disease. She was advised to call immediately if she has any concerning symptoms in the interval. The patient voices understanding of current disease status and treatment options and is in agreement with the current care plan. All questions were answered. The patient knows to call the clinic with any problems, questions or concerns. We can certainly see the patient much sooner if necessary.  Disclaimer: This note was dictated with voice recognition software. Similar sounding words can inadvertently be transcribed and may not be corrected upon review.

## 2019-03-17 NOTE — Progress Notes (Signed)
Pulse recheck 103 reported to Dr. Julien Nordmann. Received OK to treat.

## 2019-03-18 ENCOUNTER — Telehealth: Payer: Self-pay | Admitting: Internal Medicine

## 2019-03-18 NOTE — Telephone Encounter (Signed)
Scheduled per los. Called and spoke with patient. Confirmed appt 

## 2019-04-04 ENCOUNTER — Encounter (HOSPITAL_COMMUNITY): Payer: Self-pay | Admitting: Emergency Medicine

## 2019-04-04 ENCOUNTER — Other Ambulatory Visit: Payer: Self-pay

## 2019-04-04 ENCOUNTER — Emergency Department (HOSPITAL_COMMUNITY)
Admission: EM | Admit: 2019-04-04 | Discharge: 2019-04-05 | Disposition: A | Discharge status: Home / Self Care | Payer: Medicare HMO | Attending: Emergency Medicine | Admitting: Emergency Medicine

## 2019-04-04 DIAGNOSIS — C3490 Malignant neoplasm of unspecified part of unspecified bronchus or lung: Secondary | ICD-10-CM | POA: Insufficient documentation

## 2019-04-04 DIAGNOSIS — I10 Essential (primary) hypertension: Secondary | ICD-10-CM | POA: Diagnosis not present

## 2019-04-04 DIAGNOSIS — Z79899 Other long term (current) drug therapy: Secondary | ICD-10-CM | POA: Insufficient documentation

## 2019-04-04 DIAGNOSIS — I700 Atherosclerosis of aorta: Secondary | ICD-10-CM | POA: Insufficient documentation

## 2019-04-04 DIAGNOSIS — J449 Chronic obstructive pulmonary disease, unspecified: Secondary | ICD-10-CM | POA: Diagnosis not present

## 2019-04-04 DIAGNOSIS — G4489 Other headache syndrome: Secondary | ICD-10-CM | POA: Diagnosis not present

## 2019-04-04 DIAGNOSIS — I259 Chronic ischemic heart disease, unspecified: Secondary | ICD-10-CM | POA: Diagnosis not present

## 2019-04-04 DIAGNOSIS — Z87891 Personal history of nicotine dependence: Secondary | ICD-10-CM | POA: Insufficient documentation

## 2019-04-04 DIAGNOSIS — R519 Headache, unspecified: Secondary | ICD-10-CM | POA: Diagnosis present

## 2019-04-04 NOTE — ED Triage Notes (Addendum)
Pt c/o right sided headache x3 months. Pt scheduled to have head CT in the morning for the same. Pt has history of lung CA, on 3L [place] and the SOB is not changed from her usual. Pt A&Ox4.

## 2019-04-05 ENCOUNTER — Ambulatory Visit (HOSPITAL_COMMUNITY)
Admission: RE | Admit: 2019-04-05 | Discharge: 2019-04-05 | Disposition: A | Discharge status: Home / Self Care | Payer: Medicare HMO | Source: Ambulatory Visit | Attending: Internal Medicine | Admitting: Internal Medicine

## 2019-04-05 ENCOUNTER — Emergency Department (HOSPITAL_COMMUNITY): Payer: Medicare HMO

## 2019-04-05 DIAGNOSIS — C3490 Malignant neoplasm of unspecified part of unspecified bronchus or lung: Secondary | ICD-10-CM | POA: Insufficient documentation

## 2019-04-05 LAB — BASIC METABOLIC PANEL
Anion gap: 11 (ref 5–15)
BUN: 15 mg/dL (ref 8–23)
CO2: 28 mmol/L (ref 22–32)
Calcium: 9.2 mg/dL (ref 8.9–10.3)
Chloride: 101 mmol/L (ref 98–111)
Creatinine, Ser: 0.65 mg/dL (ref 0.44–1.00)
GFR calc Af Amer: >60 mL/min (ref >60)
GFR calc non Af Amer: >60 mL/min (ref >60)
Glucose, Bld: 102 mg/dL — ABNORMAL HIGH (ref 70–99)
Potassium: 3.2 mmol/L — ABNORMAL LOW (ref 3.5–5.1)
Sodium: 140 mmol/L (ref 135–145)

## 2019-04-05 LAB — CBC WITH DIFFERENTIAL/PLATELET
Abs Immature Granulocytes: 0.01 10*3/uL (ref 0.00–0.07)
Basophils Absolute: 0 10*3/uL (ref 0.0–0.1)
Basophils Relative: 0 %
Eosinophils Absolute: 0.1 10*3/uL (ref 0.0–0.5)
Eosinophils Relative: 2 %
HCT: 38.1 % (ref 36.0–46.0)
Hemoglobin: 12.8 g/dL (ref 12.0–15.0)
Immature Granulocytes: 0 %
Lymphocytes Relative: 32 %
Lymphs Abs: 1.7 10*3/uL (ref 0.7–4.0)
MCH: 28.6 pg (ref 26.0–34.0)
MCHC: 33.6 g/dL (ref 30.0–36.0)
MCV: 85.2 fL (ref 80.0–100.0)
Monocytes Absolute: 0.4 10*3/uL (ref 0.1–1.0)
Monocytes Relative: 8 %
Neutro Abs: 3.1 10*3/uL (ref 1.7–7.7)
Neutrophils Relative %: 58 %
Platelets: 222 10*3/uL (ref 150–400)
RBC: 4.47 MIL/uL (ref 3.87–5.11)
RDW: 15.7 % — ABNORMAL HIGH (ref 11.5–15.5)
WBC: 5.3 10*3/uL (ref 4.0–10.5)
nRBC: 0 % (ref 0.0–0.2)

## 2019-04-05 LAB — ACETAMINOPHEN LEVEL: Acetaminophen (Tylenol), Serum: <10 ug/mL — ABNORMAL LOW (ref 10–30)

## 2019-04-05 LAB — SALICYLATE LEVEL: Salicylate Lvl: <7 mg/dL — ABNORMAL LOW (ref 7.0–30.0)

## 2019-04-05 MED ORDER — METOCLOPRAMIDE HCL 5 MG/ML IJ SOLN
10.0000 mg | Freq: Once | INTRAMUSCULAR | Status: AC
  Administered 2019-04-05: 01:00:00 10 mg via INTRAVENOUS
  Filled 2019-04-05: qty 2

## 2019-04-05 MED ORDER — POTASSIUM CHLORIDE CRYS ER 20 MEQ PO TBCR
40.0000 meq | EXTENDED_RELEASE_TABLET | Freq: Once | ORAL | Status: AC
  Administered 2019-04-05: 40 meq via ORAL
  Filled 2019-04-05: qty 2

## 2019-04-05 MED ORDER — DIPHENHYDRAMINE HCL 50 MG/ML IJ SOLN
25.0000 mg | Freq: Once | INTRAMUSCULAR | Status: AC
  Administered 2019-04-05: 25 mg via INTRAVENOUS
  Filled 2019-04-05: qty 1

## 2019-04-05 MED ORDER — IOHEXOL 300 MG/ML  SOLN
100.0000 mL | Freq: Once | INTRAMUSCULAR | Status: AC | PRN
  Administered 2019-04-05: 100 mL via INTRAVENOUS

## 2019-04-05 NOTE — Discharge Instructions (Addendum)
The CAT scan of your brain does not show any major problems causing a headache. Please follow-up with your primary doctor soon to improve your blood pressure control You may need to go on new medications for this  You are having a headache. No specific cause was found today for your headache. It may have been a migraine or other cause of headache. Stress, anxiety, fatigue, and depression are common triggers for headaches. Your headache today does not appear to be life-threatening or require hospitalization, but often the exact cause of headaches is not determined in the emergency department. Therefore, follow-up with your doctor is very important to find out what may have caused your headache, and whether or not you need any further diagnostic testing or treatment. Sometimes headaches can appear benign (not harmful), but then more serious symptoms can develop which should prompt an immediate re-evaluation by your doctor or the emergency department.  SEEK MEDICAL ATTENTION IF:  You develop possible problems with medications prescribed.  The medications don't resolve your headache, if it recurs , or if you have multiple episodes of vomiting or can't take fluids. You have a change from the usual headache.  RETURN IMMEDIATELY IF you develop a sudden, severe headache or confusion, become poorly responsive or faint, develop a fever above 100.26F or problem breathing, have a change in speech, vision, swallowing, or understanding, or develop new weakness, numbness, tingling, incoordination, or have a seizure.

## 2019-04-05 NOTE — ED Provider Notes (Signed)
[facility] EMERGENCY DEPARTMENT Provider Note   CSN: [id number] Arrival date & time: 04/04/19  2331     History Chief Complaint  Patient presents with  . Headache    Courtney Zuniga is a 68 y.o. female.  The history is provided by the patient.  Headache Pain location:  Generalized Quality:  Sharp Onset quality:  Gradual Duration: 1 month. Timing:  Constant Progression:  Worsening Chronicity:  Recurrent Relieved by:  Nothing Worsened by:  Nothing Associated symptoms: no fever, no vomiting and no weakness   Patient with history of non-small cell lung cancer on current therapy, history of benign brain tumor, history of chronic headaches, history of COPD on home oxygen presents with headache.  She reports chronic daily headaches for at least the past month.  Today it appeared to worsen and she has also had some pain in her neck as well.  No focal weakness.  No fevers.  No new vision changes. She checked her blood pressure at home and it was elevated.  She called the ambulance service to check her blood pressure and they told her it was elevated and advised ER evaluation.  Patient was reluctant to go the ER because she is supposed  to have an outpatient CT imaging later in the day per her oncologist. Patient reports she took up to 13 Goody powders today due to severe headache     Past Medical History:  Diagnosis Date  . Anemia    as a young woman  . Arthritis    osteoartritis  . Asthma   . Brain tumor (benign) ([place]) 2005 [facility]   Benign  . Chronic headaches   . Chronic hip pain   . Chronic pain   . COPD (chronic obstructive pulmonary disease) ([place])   . Coronary artery disease   . Depression   . Depression 05/15/2016  . Encounter for antineoplastic chemotherapy 01/10/2016  . GERD (gastroesophageal reflux disease)   . Hypertension   . Lung cancer ([place]) dx'd 01/2016   currently on chemo and radiation   . NSTEMI (non-ST elevated myocardial infarction) ([place]) yrs ago  .  On home O2    qhs 2 liters at hs and prn  . Pneumonia last time 2 yrs ago  . Shortness of breath dyspnea    with activity    Patient Active Problem List   Diagnosis Date Noted  . Chronic nonintractable headache   . Acute on chronic respiratory failure with hypoxia ([place]) 06/16/2018  . Influenza A 06/16/2018  . Severe protein-calorie malnutrition ([place]) 06/16/2018  . GERD (gastroesophageal reflux disease) 06/16/2018  . PNA (pneumonia) 05/05/2018  . Sepsis ([place]) 05/05/2018  . Chronic respiratory failure ([place]) 05/05/2018  . Encounter for antineoplastic immunotherapy 04/23/2017  . Depression 05/15/2016  . Adenocarcinoma of right lung, stage 3 ([place]) 01/10/2016  . Encounter for antineoplastic chemotherapy 01/10/2016  . Lung mass 01/03/2016  . Arthritis   . Subepithelial gastric mass   . Mucosal abnormality of stomach   . Abdominal pain 08/08/2015  . Abnormal CT of the abdomen 08/08/2015  . Influenza with pneumonia 06/27/2014  . Hypoxia 06/26/2014  . COPD with acute exacerbation ([place]) 06/26/2014  . CAP (community acquired pneumonia)   . Chest pain, rule out acute myocardial infarction   . Asthma, chronic   . Chest pain 02/16/2014  . HTN (hypertension) 02/16/2014  . DDD (degenerative disc disease), lumbar 02/04/2011  . Lumbar herniated disc 01/09/2011    Past Surgical History:  Procedure Laterality Date  .  CHOLECYSTECTOMY    . COLONOSCOPY  2015   Results requested from [facility]  . COLONOSCOPY    . ESOPHAGOGASTRODUODENOSCOPY N/A 08/14/2015   Procedure: ESOPHAGOGASTRODUODENOSCOPY (EGD);  Surgeon: Daneil Dolin, MD;  Location: [facility];  Service: Endoscopy;  Laterality: N/A;  215   . ESOPHAGOGASTRODUODENOSCOPY (EGD) WITH PROPOFOL N/A 09/13/2015   Procedure: ESOPHAGOGASTRODUODENOSCOPY (EGD) WITH PROPOFOL;  Surgeon: Milus Banister, MD;  Location: [facility] ENDOSCOPY;  Service: Endoscopy;  Laterality: N/A;  . EUS N/A 03/12/2017   Procedure: UPPER ENDOSCOPIC ULTRASOUND  (EUS) RADIAL;  Surgeon: Milus Banister, MD;  Location: [facility] ENDOSCOPY;  Service: Endoscopy;  Laterality: N/A;  . TUMOR REMOVAL  2005   Benign  . UPPER ESOPHAGEAL ENDOSCOPIC ULTRASOUND (EUS)  09/13/2015   Procedure: UPPER ESOPHAGEAL ENDOSCOPIC ULTRASOUND (EUS);  Surgeon: Milus Banister, MD;  Location: [facility];  Service: Endoscopy;;  . VIDEO BRONCHOSCOPY WITH ENDOBRONCHIAL NAVIGATION N/A 12/31/2015   Procedure: VIDEO BRONCHOSCOPY WITH ENDOBRONCHIAL NAVIGATION;  Surgeon: Melrose Nakayama, MD;  Location: [place];  Service: Thoracic;  Laterality: N/A;  . VIDEO BRONCHOSCOPY WITH ENDOBRONCHIAL ULTRASOUND N/A 11/08/2015   Procedure: VIDEO BRONCHOSCOPY WITH ENDOBRONCHIAL ULTRASOUND;  Surgeon: Ivin Poot, MD;  Location: [facility] OR;  Service: Thoracic;  Laterality: N/A;     OB History   No obstetric history on file.     Family History  Problem Relation Age of Onset  . Ovarian cancer Mother   . Lung cancer Father   . Lung cancer Brother   . Lung cancer Brother   . Prostate cancer Brother   . Brain cancer Sister        Half sister    Social History   Tobacco Use  . Smoking status: Former Smoker    Packs/day: 1.00    Years: 38.00    Pack years: 38.00    Types: Cigarettes    Quit date: 07/26/2018    Years since quitting: 0.6  . Smokeless tobacco: Never Used  . Tobacco comment: Pt has asked doctor for med to help   Substance Use Topics  . Alcohol use: No    Alcohol/week: 0.0 standard drinks  . Drug use: No    Home Medications Prior to Admission medications   Medication Sig Start Date End Date Taking? Authorizing Provider  acetaminophen (TYLENOL) 325 MG tablet Take 2 tablets (650 mg total) by mouth every 6 (six) hours as needed for mild pain, fever or headache. 05/08/18   Denton Brick, Courage, MD  albuterol (PROVENTIL HFA;VENTOLIN HFA) 108 (90 BASE) MCG/ACT inhaler Inhale 2 puffs into the lungs every 4 (four) hours as needed for shortness of breath.     [provider]  BREO  ELLIPTA 100-25 MCG/INH AEPB Inhale 1 puff into the lungs daily.  04/30/17   [provider]  Cholecalciferol (VITAMIN D3) 125 MCG (5000 UT) CAPS Take 1 capsule by mouth daily. 02/17/18   [provider]  cyclobenzaprine (FLEXERIL) 10 MG tablet Take 1 tablet (10 mg total) by mouth 2 (two) times daily. *MAy take one additional tablet as needed for muscle spasms 05/08/18   Roxan Hockey, MD  dextromethorphan-guaiFENesin [facility] DM) 30-600 MG 12hr tablet Take 1 tablet by mouth 2 (two) times daily. 06/18/18   Barton Dubois, MD  diclofenac sodium (VOLTAREN) 1 % GEL Apply topically 4 (four) times daily as needed (massge gel into affected area(s) as needed for pain).  08/11/17   [provider]  ENSURE (ENSURE) Take 237 mLs by mouth 3 (three)  times daily between meals.    [provider]  ipratropium-albuterol (DUONEB) 0.5-2.5 (3) MG/3ML SOLN Take 3 mLs by nebulization 3 (three) times daily. 05/08/18   Roxan Hockey, MD  metoprolol tartrate (LOPRESSOR) 25 MG tablet Take 1 tablet (25 mg total) by mouth 2 (two) times daily. 05/08/18   Roxan Hockey, MD  nicotine (NICODERM CQ - DOSED IN MG/24 HOURS) 14 mg/24hr patch Place 1 patch (14 mg total) onto the skin daily. 05/09/18   Roxan Hockey, MD  oseltamivir (TAMIFLU) 75 MG capsule Take 1 capsule (75 mg total) by mouth 2 (two) times daily. 06/18/18   Barton Dubois, MD  oxyCODONE-acetaminophen (PERCOCET/ROXICET) 5-325 MG tablet Take 1 tablet by mouth every 8 (eight) hours as needed for moderate pain. 05/08/18   Roxan Hockey, MD  pantoprazole (PROTONIX) 40 MG tablet Take 1 tablet (40 mg total) by mouth daily. 05/08/18   Emokpae, Courage, MD  ROBITUSSIN 12 HOUR COUGH 30 MG/5ML liquid Take 30 mg by mouth every 4 (four) hours as needed for cough.  01/25/18   [provider]  topiramate (TOPAMAX) 25 MG tablet Take 1 tablet by mouth daily at bedtime X 1 week; then increase to 25mg  BID. 06/18/18   Barton Dubois, MD     Allergies    Patient has no known allergies.  Review of Systems   Review of Systems  Constitutional: Negative for fever.  Eyes: Negative for visual disturbance.  Respiratory: Positive for shortness of breath.   Cardiovascular: Negative for chest pain.  Gastrointestinal: Negative for vomiting.  Neurological: Positive for headaches. Negative for weakness.  All other systems reviewed and are negative.   Physical Exam Updated Vital Signs BP (!) 160/105   Pulse 100   Temp 98.1 F (36.7 C)   Resp (!) 21   Ht 1.702 m (5\' 7" )   Wt 65.3 kg   SpO2 100%   BMI 22.55 kg/m   Physical Exam CONSTITUTIONAL: Chronically ill-appearing, anxious HEAD: Normocephalic/atraumatic EYES: EOMI/PERRL, no nystagmus, no ptosis, left blepharospasm(chronic per patient) ENMT: Mucous membranes moist NECK: supple no meningeal signs, no bruits SPINE/BACK:entire spine nontender CV: S1/S2 noted, no murmurs/rubs/gallops noted LUNGS: Lungs are clear to auscultation bilaterally. mild tachypnea, on chronic oxygen  ABDOMEN: soft, nontender, no rebound or guarding GU:no cva tenderness NEURO:Awake/alert, face symmetric, no arm or leg drift is noted Equal 5/5 strength with shoulder abduction, elbow flex/extension, wrist flex/extension in upper extremities and equal hand grips bilaterally Equal 5/5 strength with hip flexion,knee flex/extension, foot dorsi/plantar flexion Cranial nerves 3/4/5/6/10/20/08/11/12 tested and intact No past pointing Sensation to light touch intact in all extremities EXTREMITIES: pulses normal, full ROM SKIN: warm, color normal PSYCH: Anxious  ED Results / Procedures / Treatments   Labs (all labs ordered are listed, but only abnormal results are displayed) Labs Reviewed  BASIC METABOLIC PANEL - Abnormal; Notable for the following components:      Result Value   Potassium 3.2 (*)    Glucose, Bld 102 (*)    All other components within normal limits  CBC WITH DIFFERENTIAL/PLATELET  - Abnormal; Notable for the following components:   RDW 15.7 (*)    All other components within normal limits  SALICYLATE LEVEL - Abnormal; Notable for the following components:   Salicylate Lvl <2.4 (*)    All other components within normal limits  ACETAMINOPHEN LEVEL - Abnormal; Notable for the following components:   Acetaminophen (Tylenol), Serum <10 (*)    All other components within normal limits    EKG EKG  Interpretation  Date/Time:  Monday April 04 2019 23:45:44 EST Ventricular Rate:  96 PR Interval:    QRS Duration: 82 QT Interval:  376 QTC Calculation: 476 R Axis:   -6 Text Interpretation: Sinus rhythm Anterior infarct, old No significant change since last tracing Confirmed by Ripley Fraise [phone]) on 04/04/2019 11:50:52 PM   Radiology CT Head Wo Contrast  Result Date: 04/05/2019 CLINICAL DATA:  Right headache for 3 months, history of lung cancer EXAM: CT HEAD WITHOUT CONTRAST TECHNIQUE: Contiguous axial images were obtained from the base of the skull through the vertex without intravenous contrast. COMPARISON:  MRI 01/31/2019, CT 09/04/2015 FINDINGS: Brain: No evidence of acute infarction, hemorrhage, hydrocephalus, extra-axial collection or mass lesion/mass effect. Symmetric prominence of the ventricles, cisterns and sulci compatible with parenchymal volume loss. Patchy areas of white matter hypoattenuation are most compatible with chronic microvascular angiopathy. Vascular: Atherosclerotic calcification of the carotid siphons and intradural vertebral arteries. No hyperdense vessel. 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 No acute calvarial fracture, scalp swelling or hematoma. Sinuses/Orbits: Paranasal sinuses are predominantly clear. Chronic right mastoid effusion. The left  mastoid air cells are predominantly clear. Included orbital structures are unremarkable. Other: None IMPRESSION: No acute intracranial abnormality. 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 Overall appearance is unchanged from multiple prior studies. Electronically Signed   By: Lovena Le M.D.   On: 04/05/2019 01:24    Procedures Procedures   Medications Ordered in ED Medications  metoCLOPramide (REGLAN) injection 10 mg (10 mg Intravenous Given 04/05/19 0045)  diphenhydrAMINE (BENADRYL) injection 25 mg (25 mg Intravenous Given 04/05/19 0044)  potassium chloride SA (KLOR-CON) CR tablet 40 mEq (40 mEq Oral Given 04/05/19 0157)    ED Course  I have reviewed the triage vital signs and the nursing notes.  Pertinent labs & imaging results that were available during my care of the patient were reviewed by me and considered in my medical decision making (see chart for details).    MDM Rules/Calculators/A&P                      12:36 AM Patient with history of chronic headaches that she feels is worsening.  She is also having elevated blood pressure despite taking her home medicines. Patient had an MRI over 2 months ago did not reveal any acute process.  Due to worsening headache, will perform screening CT head. No focal weakness to suggest stroke. Patient reports her shortness of breath is chronic at baseline We will also check salicylate and Tylenol levels as patient reports taking significant amounts of Goody powder due to headache 2:32 AM Other than mild hypokalemia, labs reassuring.  CT head is negative Patient feels improved.  She is able to ambulate but only limited due to her chronic shortness of breath Suspect her chronic headache was worsened by elevation her blood pressure. She appears safe for discharge, no signs of acute neurologic emergency.  Patient feels comfortable for discharge Advised her for  recheck with her PCP of her blood pressure as she may need better control BP with medications Final Clinical Impression(s) / ED Diagnoses Final diagnoses:  Other headache syndrome  Essential hypertension    Rx / DC Orders ED Discharge Orders    None       Ripley Fraise, MD 04/05/19 [phone]

## 2019-04-13 NOTE — Progress Notes (Signed)
[place] OFFICE PROGRESS NOTE  Barry Dienes, NP No address on file  DIAGNOSIS: Recurrent non-small cell lung cancer initially diagnosed as stage IIIA (T1b, N2, M0) non-small cell lung cancer presented with right middle lobe pulmonary nodule, mediastinal lymphadenopathy and highly suspicious for small nodule in the left upper lobe that could change her stage to stage IV that could present another synchronous primary lesion in the left upper lobe. This was diagnosed in September 2017.  PRIOR THERAPY: 1) Concurrent chemoradiation with weekly carboplatin for AUC of 2 and paclitaxel 45 MG/M2 status post 6 cycles last dose was given 02/25/2016 with partial response. 2) Consolidation chemotherapy with carboplatin for AUC of 5 and paclitaxel 175 MG/M2 every 3 weeks with Neulasta support. First dose 04/22/2016. Status post 3 cycles.  CURRENT THERAPY: Second line treatment with immunotherapy with Nivolumab 480 mg IV every 4 weeks status post 25cycles  INTERVAL HISTORY: Citlalic Shoults 68 y.o. female returns to the clinic for a follow up visit. The patient is feeling well today without any concerning complaints except for her baseline shortness of breath with exertion and chronic cough secondary to her COPD for which she is on oxygen via nasal cannula at home. The patient continues to tolerate treatment with nivolumab well without any adverse side effects. Denies any fever, chills, night sweats, or weight loss. Denies any chest pain or hemoptysis. Denies any nausea, vomiting, diarrhea, or constipation.  Denies any rashes or skin changes. She experiences a chronic headaches. She had a history of a brain tumor in 2005. She had a brain MRI recently to further assess her headache to make sure their was no metastatic disease to the brain.  She was seen in the ER on 12/22 for the chief complaint of a headache which did not find any abnormalities on her CT head. She recently had her restaging CT scan  performed of her chest, abdomen, and pelvis. She is here today for evaluation before starting cycle #26.   MEDICAL HISTORY: Past Medical History:  Diagnosis Date  . Anemia    as a young woman  . Arthritis    osteoartritis  . Asthma   . Brain tumor (benign) ([place]) 2005 [facility]   Benign  . Chronic headaches   . Chronic hip pain   . Chronic pain   . COPD (chronic obstructive pulmonary disease) ([place])   . Coronary artery disease   . Depression   . Depression 05/15/2016  . Encounter for antineoplastic chemotherapy 01/10/2016  . GERD (gastroesophageal reflux disease)   . Hypertension   . Lung cancer ([place]) dx'd 01/2016   currently on chemo and radiation   . NSTEMI (non-ST elevated myocardial infarction) ([place]) yrs ago  . On home O2    qhs 2 liters at hs and prn  . Pneumonia last time 2 yrs ago  . Shortness of breath dyspnea    with activity    ALLERGIES:  has No Known Allergies.  MEDICATIONS:  Current Outpatient Medications  Medication Sig Dispense Refill  . acetaminophen (TYLENOL) 325 MG tablet Take 2 tablets (650 mg total) by mouth every 6 (six) hours as needed for mild pain, fever or headache. 12 tablet 2  . albuterol (PROVENTIL HFA;VENTOLIN HFA) 108 (90 BASE) MCG/ACT inhaler Inhale 2 puffs into the lungs every 4 (four) hours as needed for shortness of breath.     Marland Kitchen BREO ELLIPTA 100-25 MCG/INH AEPB Inhale 1 puff into the lungs daily.     . Cholecalciferol (VITAMIN D3) 125  MCG (5000 UT) CAPS Take 1 capsule by mouth daily.  0  . cyclobenzaprine (FLEXERIL) 10 MG tablet Take 1 tablet (10 mg total) by mouth 2 (two) times daily. *MAy take one additional tablet as needed for muscle spasms 60 tablet 2  . dextromethorphan-guaiFENesin (MUCINEX DM) 30-600 MG 12hr tablet Take 1 tablet by mouth 2 (two) times daily. 40 tablet 0  . diclofenac sodium (VOLTAREN) 1 % GEL Apply topically 4 (four) times daily as needed (massge gel into affected area(s) as needed for pain).   1  . ENSURE (ENSURE) Take  237 mLs by mouth 3 (three) times daily between meals.    Marland Kitchen ipratropium-albuterol (DUONEB) 0.5-2.5 (3) MG/3ML SOLN Take 3 mLs by nebulization 3 (three) times daily. 360 mL 2  . metoprolol tartrate (LOPRESSOR) 25 MG tablet Take 1 tablet (25 mg total) by mouth 2 (two) times daily. 60 tablet 1  . nicotine (NICODERM CQ - DOSED IN MG/24 HOURS) 14 mg/24hr patch Place 1 patch (14 mg total) onto the skin daily. 28 patch 0  . oseltamivir (TAMIFLU) 75 MG capsule Take 1 capsule (75 mg total) by mouth 2 (two) times daily. 6 capsule 0  . oxyCODONE-acetaminophen (PERCOCET/ROXICET) 5-325 MG tablet Take 1 tablet by mouth every 8 (eight) hours as needed for moderate pain. 12 tablet 0  . pantoprazole (PROTONIX) 40 MG tablet Take 1 tablet (40 mg total) by mouth daily. 30 tablet 3  . ROBITUSSIN 12 HOUR COUGH 30 MG/5ML liquid Take 30 mg by mouth every 4 (four) hours as needed for cough.     . topiramate (TOPAMAX) 25 MG tablet Take 1 tablet by mouth daily at bedtime X 1 week; then increase to 25mg  BID. 45 tablet 2   No current facility-administered medications for this visit.   Facility-Administered Medications Ordered in Other Visits  Medication Dose Route Frequency Provider Last Rate Last Admin  . nivolumab (OPDIVO) 480 mg in sodium chloride 0.9 % 100 mL chemo infusion  480 mg Intravenous Once Curt Bears, MD        SURGICAL HISTORY:  Past Surgical History:  Procedure Laterality Date  . CHOLECYSTECTOMY    . COLONOSCOPY  2015   Results requested from [facility]  . COLONOSCOPY    . ESOPHAGOGASTRODUODENOSCOPY N/A 08/14/2015   Procedure: ESOPHAGOGASTRODUODENOSCOPY (EGD);  Surgeon: Daneil Dolin, MD;  Location: [facility];  Service: Endoscopy;  Laterality: N/A;  215   . ESOPHAGOGASTRODUODENOSCOPY (EGD) WITH PROPOFOL N/A 09/13/2015   Procedure: ESOPHAGOGASTRODUODENOSCOPY (EGD) WITH PROPOFOL;  Surgeon: Milus Banister, MD;  Location: [facility];  Service: Endoscopy;  Laterality: N/A;  . EUS N/A  03/12/2017   Procedure: UPPER ENDOSCOPIC ULTRASOUND (EUS) RADIAL;  Surgeon: Milus Banister, MD;  Location: [facility];  Service: Endoscopy;  Laterality: N/A;  . TUMOR REMOVAL  2005   Benign  . UPPER ESOPHAGEAL ENDOSCOPIC ULTRASOUND (EUS)  09/13/2015   Procedure: UPPER ESOPHAGEAL ENDOSCOPIC ULTRASOUND (EUS);  Surgeon: Milus Banister, MD;  Location: [facility];  Service: Endoscopy;;  . VIDEO BRONCHOSCOPY WITH ENDOBRONCHIAL NAVIGATION N/A 12/31/2015   Procedure: VIDEO BRONCHOSCOPY WITH ENDOBRONCHIAL NAVIGATION;  Surgeon: Melrose Nakayama, MD;  Location: [place] [facility];  Service: Thoracic;  Laterality: N/A;  . VIDEO BRONCHOSCOPY WITH ENDOBRONCHIAL ULTRASOUND N/A 11/08/2015   Procedure: VIDEO BRONCHOSCOPY WITH ENDOBRONCHIAL ULTRASOUND;  Surgeon: Ivin Poot, MD;  Location: [facility];  Service: Thoracic;  Laterality: N/A;    REVIEW OF SYSTEMS:   Review of Systems  Constitutional: Negative for appetite change, chills,  fatigue, fever and unexpected weight change.  HENT:   Negative for mouth sores, nosebleeds, sore throat and trouble swallowing.   Eyes: Negative for eye problems and icterus.  Respiratory: Positive for shortness of breath with exertion and chronic cough. Negative for hemoptysis and wheezing. Cardiovascular: Negative for chest pain and leg swelling.  Gastrointestinal: Negative for abdominal pain, constipation, diarrhea, nausea and vomiting.  Genitourinary: Negative for bladder incontinence, difficulty urinating, dysuria, frequency and hematuria.   Musculoskeletal: Negative for back pain, gait problem, neck pain and neck stiffness.  Skin: Negative for itching and rash.  Neurological: Positive for chronic headaches. Negative for dizziness, extremity weakness, gait problem, light-headedness and seizures.  Hematological: Negative for adenopathy. Does not bruise/bleed easily.  Psychiatric/Behavioral: Negative for confusion, depression and sleep disturbance. The patient is not nervous/anxious.      PHYSICAL EXAMINATION:  Blood pressure 138/87, pulse (!) 107, temperature 98 F (36.7 C), temperature source Temporal, resp. rate 18, height 5\' 7"  (1.702 m), weight 141 lb 11.2 oz (64.3 kg), SpO2 93 %.  ECOG PERFORMANCE STATUS: 1 - Symptomatic but completely ambulatory  Physical Exam  Constitutional: Oriented to person, place, and time and well-developed, well-nourished, and in no distress. No distress.  HENT:  Head: Normocephalic and atraumatic.  Mouth/Throat: Oropharynx is clear and moist. No oropharyngeal exudate.  Eyes: Conjunctivae are normal. Right eye exhibits no discharge. Left eye exhibits no discharge. No scleral icterus.  Neck: Normal range of motion. Neck supple.  Cardiovascular: Normal rate, regular rhythm, normal heart sounds and intact distal pulses.   Pulmonary/Chest: Effort normal. Quiet breath sounds in all lung fields. No respiratory distress. No wheezes. No rales.  Abdominal: Soft. Bowel sounds are normal. Exhibits no distension and no mass. There is no tenderness.  Musculoskeletal: Normal range of motion. Exhibits no edema.  Lymphadenopathy:    No cervical adenopathy.  Neurological: Left facial twitching and weakness (baseline from brain tumor in 2005). Alert and oriented to person, place, and time. Exhibits normal muscle tone. Gait normal. Coordination normal.  Skin: Skin is warm and dry. No rash noted. Not diaphoretic. No erythema. No pallor.  Psychiatric: Mood, memory and judgment normal.  Vitals reviewed.  LABORATORY DATA: Lab Results  Component Value Date   WBC 5.1 04/14/2019   HGB 13.6 04/14/2019   HCT 40.1 04/14/2019   MCV 85.1 04/14/2019   PLT 219 04/14/2019      Chemistry      Component Value Date/Time   NA 145 04/14/2019 1406   NA 144 04/09/2017 0948   K 3.8 04/14/2019 1406   K 4.3 04/09/2017 0948   CL 107 04/14/2019 1406   CO2 28 04/14/2019 1406   CO2 25 04/09/2017 0948   BUN 18 04/14/2019 1406   BUN 16.8 04/09/2017 0948   CREATININE  0.82 04/14/2019 1406   CREATININE 0.8 04/09/2017 0948      Component Value Date/Time   CALCIUM 9.5 04/14/2019 1406   CALCIUM 9.1 04/09/2017 0948   ALKPHOS 361 (H) 04/14/2019 1406   ALKPHOS 216 (H) 04/09/2017 0948   AST 37 04/14/2019 1406   AST 12 04/09/2017 0948   ALT 43 04/14/2019 1406   ALT 13 04/09/2017 0948   BILITOT <0.2 (L) 04/14/2019 1406   BILITOT 0.22 04/09/2017 0948       RADIOGRAPHIC STUDIES:  CT Head Wo Contrast  Result Date: 04/05/2019 CLINICAL DATA:  Right headache for 3 months, history of lung cancer EXAM: CT HEAD WITHOUT CONTRAST TECHNIQUE: Contiguous axial images were obtained from the base  of the skull through the vertex without intravenous contrast. COMPARISON:  MRI 01/31/2019, CT 09/04/2015 FINDINGS: Brain: No evidence of acute infarction, hemorrhage, hydrocephalus, extra-axial collection or mass lesion/mass effect. Symmetric prominence of the ventricles, cisterns and sulci compatible with parenchymal volume loss. Patchy areas of white matter hypoattenuation are most compatible with chronic microvascular angiopathy. Vascular: Atherosclerotic calcification of the carotid siphons and intradural vertebral arteries. No hyperdense vessel. 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 No acute calvarial fracture, scalp swelling or hematoma. Sinuses/Orbits: Paranasal sinuses are predominantly clear. Chronic right mastoid effusion. The left mastoid air cells are predominantly clear. Included orbital structures are unremarkable. Other: None IMPRESSION: No acute intracranial abnormality. 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 Overall appearance is unchanged from multiple prior studies. Electronically Signed   By: Lovena Le M.D.   On: 04/05/2019 01:24   CT Chest W Contrast  Result Date: 04/05/2019 CLINICAL DATA:  Stage IIIA right middle lobe non-small cell lung cancer status post concurrent chemo radiation therapy in 2017 and consolidation chemotherapy, with subsequent recurrence, currently on second-line treatment with immunotherapy. Restaging. EXAM: CT CHEST, ABDOMEN, AND PELVIS WITH CONTRAST TECHNIQUE: Multidetector CT imaging of the chest, abdomen and pelvis was performed following the standard protocol during bolus administration of intravenous contrast. CONTRAST:  1101mL OMNIPAQUE IOHEXOL 300 MG/ML  SOLN COMPARISON:  01/18/2019 CT chest, abdomen and pelvis. FINDINGS: CT CHEST FINDINGS Cardiovascular: Normal heart size. No significant pericardial effusion/thickening. Left anterior descending and right coronary atherosclerosis. Atherosclerotic nonaneurysmal thoracic aorta. Stable top-normal caliber main pulmonary artery (3.3 cm diameter). No central pulmonary emboli. Mediastinum/Nodes: No discrete thyroid nodules. Unremarkable esophagus. No pathologically enlarged axillary, mediastinal or hilar lymph nodes. Lungs/Pleura: No pneumothorax. No pleural effusion. Severe centrilobular and paraseptal emphysema with mild diffuse bronchial wall thickening. Masslike fibrosis in the right middle lobe measures 6.0 x 3.6 cm (series 3/image 96), previously 5.7 x 4.0 cm, not appreciably changed. Subsolid right lower lobe 1.1 cm pulmonary nodule (series 3/image 109), previously 1.1 cm using similar measurement technique, stable. No acute consolidative airspace disease or new significant pulmonary nodules. Musculoskeletal: No aggressive appearing focal osseous lesions. Mild thoracic spondylosis. CT ABDOMEN PELVIS FINDINGS Hepatobiliary: Normal liver with no liver mass. Cholecystectomy. Bile ducts are stable and within normal post cholecystectomy limits. Pancreas: Normal, with no mass or duct dilation. Spleen: Normal size. No mass.  Adrenals/Urinary Tract: Normal adrenals. Scattered subcentimeter hypodense renal cortical lesions in both kidneys are too small to characterize and are unchanged, considered benign. No new renal lesions. No hydronephrosis. Normal bladder. Stomach/Bowel: Normal non-distended stomach. Normal caliber small bowel with no small bowel wall thickening. Normal appendix. Oral contrast transits to the rectum. Marked left colonic diverticulosis, with no large bowel wall thickening or significant pericolonic fat stranding. Vascular/Lymphatic: Atherosclerotic nonaneurysmal thoracic aorta. Patent portal, splenic, hepatic and renal veins. No pathologically enlarged lymph nodes in the abdomen or pelvis. 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 No adnexal masses. Other: No pneumoperitoneum, ascites or focal fluid collection. Musculoskeletal: No aggressive appearing focal osseous lesions. IMPRESSION: 1. Stable masslike fibrosis in the right middle lobe with no evidence of local tumor recurrence. 2. Stable subsolid right lower lobe 1.1 cm pulmonary nodule. No new or progressive findings of metastatic disease in the chest. 3. No evidence of metastatic disease in the abdomen or pelvis.  4. Aortic Atherosclerosis (ICD10-I70.0) and Emphysema (ICD10-J43.9). Additional chronic findings as detailed. Electronically Signed   By: Ilona Sorrel M.D.   On: 04/05/2019 09:36   CT Abdomen Pelvis W Contrast  Result Date: 04/05/2019 CLINICAL DATA:  Stage IIIA right middle lobe non-small cell lung cancer status post concurrent chemo radiation therapy in 2017 and consolidation chemotherapy, with subsequent recurrence, currently on second-line treatment with immunotherapy. Restaging. EXAM: CT CHEST, ABDOMEN, AND PELVIS WITH CONTRAST TECHNIQUE: Multidetector CT imaging of the chest, abdomen and pelvis was performed following the standard protocol during bolus administration of intravenous contrast.  CONTRAST:  146mL OMNIPAQUE IOHEXOL 300 MG/ML  SOLN COMPARISON:  01/18/2019 CT chest, abdomen and pelvis. FINDINGS: CT CHEST FINDINGS Cardiovascular: Normal heart size. No significant pericardial effusion/thickening. Left anterior descending and right coronary atherosclerosis. Atherosclerotic nonaneurysmal thoracic aorta. Stable top-normal caliber main pulmonary artery (3.3 cm diameter). No central pulmonary emboli. Mediastinum/Nodes: No discrete thyroid nodules. Unremarkable esophagus. No pathologically enlarged axillary, mediastinal or hilar lymph nodes. Lungs/Pleura: No pneumothorax. No pleural effusion. Severe centrilobular and paraseptal emphysema with mild diffuse bronchial wall thickening. Masslike fibrosis in the right middle lobe measures 6.0 x 3.6 cm (series 3/image 96), previously 5.7 x 4.0 cm, not appreciably changed. Subsolid right lower lobe 1.1 cm pulmonary nodule (series 3/image 109), previously 1.1 cm using similar measurement technique, stable. No acute consolidative airspace disease or new significant pulmonary nodules. Musculoskeletal: No aggressive appearing focal osseous lesions. Mild thoracic spondylosis. CT ABDOMEN PELVIS FINDINGS Hepatobiliary: Normal liver with no liver mass. Cholecystectomy. Bile ducts are stable and within normal post cholecystectomy limits. Pancreas: Normal, with no mass or duct dilation. Spleen: Normal size. No mass. Adrenals/Urinary Tract: Normal adrenals. Scattered subcentimeter hypodense renal cortical lesions in both kidneys are too small to characterize and are unchanged, considered benign. No new renal lesions. No hydronephrosis. Normal bladder. Stomach/Bowel: Normal non-distended stomach. Normal caliber small bowel with no small bowel wall thickening. Normal appendix. Oral contrast transits to the rectum. Marked left colonic diverticulosis, with no large bowel wall thickening or significant pericolonic fat stranding. Vascular/Lymphatic: Atherosclerotic  nonaneurysmal thoracic aorta. Patent portal, splenic, hepatic and renal veins. No pathologically enlarged lymph nodes in the abdomen or pelvis. 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 No adnexal masses. Other: No pneumoperitoneum, ascites or focal fluid collection. Musculoskeletal: No aggressive appearing focal osseous lesions. IMPRESSION: 1. Stable masslike fibrosis in the right middle lobe with no evidence of local tumor recurrence. 2. Stable subsolid right lower lobe 1.1 cm pulmonary nodule. No new or progressive findings of metastatic disease in the chest. 3. No evidence of metastatic disease in the abdomen or pelvis. 4. Aortic Atherosclerosis (ICD10-I70.0) and Emphysema (ICD10-J43.9). Additional chronic findings as detailed. Electronically Signed   By: Ilona Sorrel M.D.   On: 04/05/2019 09:36     ASSESSMENT/PLAN:  This is a very pleasant 68 year old African-American female with recurrent metastatic non-small cell lung cancer, adenocarcinoma of the right middle lobe. She was initially diagnosed as stage IIIa in September 2017.   She completed concurrent chemoradiation followed by consolidation with carboplatin and paclitaxel. She is status post3 cycles. The patient was on observation but a restagingCTscan showed evidence of disease progression.  Sheis currently undergoing treatment withsecond line with immunotherapy with nivolumab 480 mg IV every 4 weeks. She is status post25cycles.   The patient recently had a restaging CT scan performed. Dr. Julien Nordmann personally and independently reviewed the scan and discussed the results with the patient. The scan did not show any evidence  of disease progression. Recommend that she proceed with cycle #26 today as scheduled.   We will see her back for a follow up visit in 4 weeks for evaluation before starting cycle #27.   The patient was advised to call immediately if she has any concerning  symptoms in the interval. The patient voices understanding of current disease status and treatment options and is in agreement with the current care plan. All questions were answered. The patient knows to call the clinic with any problems, questions or concerns. We can certainly see the patient much sooner if necessary.   No orders of the defined types were placed in this encounter.    Shean Gerding L Oley Lahaie, PA-C 04/14/19  ADDENDUM: Hematology/Oncology Attending: I had a face-to-face encounter with the patient today.  I recommended her care plan.  This is a very pleasant 68 years old African-American female with recurrent non-small cell lung cancer, adenocarcinoma.  The patient is currently undergoing second line treatment with immunotherapy with nivolumab 480 mg IV every 4 weeks is status post 25 cycles. The patient has been tolerating this treatment well with no concerning complaints.  She had frequent headache and she had several imaging studies performed recently including MRI of the brain as well as CT scan that were unremarkable for any metastatic disease to the brain or any other acute abnormalities. She had repeat CT scan of the chest, abdomen pelvis performed recently.  I personally and independently reviewed the scans and discussed the results with the patient today. Her scan showed no concerning findings for disease progression. I recommended for the patient to continue her current treatment with nivolumab and she will proceed with cycle #26 today. She will come back for follow-up visit in 4 weeks for evaluation before the next cycle of her treatment. The patient was advised to call immediately if she has any concerning symptoms in the interval.  Disclaimer: This note was dictated with voice recognition software. Similar sounding words can inadvertently be transcribed and may be missed upon review. Eilleen Kempf, MD 04/14/19

## 2019-04-14 ENCOUNTER — Inpatient Hospital Stay: Payer: Medicare HMO

## 2019-04-14 ENCOUNTER — Inpatient Hospital Stay (HOSPITAL_BASED_OUTPATIENT_CLINIC_OR_DEPARTMENT_OTHER): Payer: Medicare HMO | Admitting: Physician Assistant

## 2019-04-14 ENCOUNTER — Other Ambulatory Visit: Payer: Self-pay

## 2019-04-14 ENCOUNTER — Encounter: Payer: Self-pay | Admitting: Physician Assistant

## 2019-04-14 VITALS — HR 98

## 2019-04-14 VITALS — BP 138/87 | HR 107 | Temp 98.0°F | Resp 18 | Ht 67.0 in | Wt 141.7 lb

## 2019-04-14 DIAGNOSIS — C3490 Malignant neoplasm of unspecified part of unspecified bronchus or lung: Secondary | ICD-10-CM

## 2019-04-14 DIAGNOSIS — C3491 Malignant neoplasm of unspecified part of right bronchus or lung: Secondary | ICD-10-CM

## 2019-04-14 DIAGNOSIS — Z5112 Encounter for antineoplastic immunotherapy: Secondary | ICD-10-CM

## 2019-04-14 LAB — CMP (CANCER CENTER ONLY)
ALT: 43 U/L (ref 0–44)
AST: 37 U/L (ref 15–41)
Albumin: 3.9 g/dL (ref 3.5–5.0)
Alkaline Phosphatase: 361 U/L — ABNORMAL HIGH (ref 38–126)
Anion gap: 10 (ref 5–15)
BUN: 18 mg/dL (ref 8–23)
CO2: 28 mmol/L (ref 22–32)
Calcium: 9.5 mg/dL (ref 8.9–10.3)
Chloride: 107 mmol/L (ref 98–111)
Creatinine: 0.82 mg/dL (ref 0.44–1.00)
GFR, Est AFR Am: >60 mL/min (ref >60)
GFR, Estimated: >60 mL/min (ref >60)
Glucose, Bld: 77 mg/dL (ref 70–99)
Potassium: 3.8 mmol/L (ref 3.5–5.1)
Sodium: 145 mmol/L (ref 135–145)
Total Bilirubin: <0.2 mg/dL — ABNORMAL LOW (ref 0.3–1.2)
Total Protein: 7.2 g/dL (ref 6.5–8.1)

## 2019-04-14 LAB — CBC WITH DIFFERENTIAL (CANCER CENTER ONLY)
Abs Immature Granulocytes: 0.02 10*3/uL (ref 0.00–0.07)
Basophils Absolute: 0 10*3/uL (ref 0.0–0.1)
Basophils Relative: 1 %
Eosinophils Absolute: 0 10*3/uL (ref 0.0–0.5)
Eosinophils Relative: 1 %
HCT: 40.1 % (ref 36.0–46.0)
Hemoglobin: 13.6 g/dL (ref 12.0–15.0)
Immature Granulocytes: 0 %
Lymphocytes Relative: 29 %
Lymphs Abs: 1.5 10*3/uL (ref 0.7–4.0)
MCH: 28.9 pg (ref 26.0–34.0)
MCHC: 33.9 g/dL (ref 30.0–36.0)
MCV: 85.1 fL (ref 80.0–100.0)
Monocytes Absolute: 0.4 10*3/uL (ref 0.1–1.0)
Monocytes Relative: 7 %
Neutro Abs: 3.2 10*3/uL (ref 1.7–7.7)
Neutrophils Relative %: 62 %
Platelet Count: 219 10*3/uL (ref 150–400)
RBC: 4.71 MIL/uL (ref 3.87–5.11)
RDW: 15.6 % — ABNORMAL HIGH (ref 11.5–15.5)
WBC Count: 5.1 10*3/uL (ref 4.0–10.5)
nRBC: 0 % (ref 0.0–0.2)

## 2019-04-14 LAB — TSH: TSH: 1.838 u[IU]/mL (ref 0.308–3.960)

## 2019-04-14 MED ORDER — SODIUM CHLORIDE 0.9 % IV SOLN
480.0000 mg | Freq: Once | INTRAVENOUS | Status: AC
  Administered 2019-04-14: 16:00:00 480 mg via INTRAVENOUS
  Filled 2019-04-14: qty 48

## 2019-04-14 MED ORDER — SODIUM CHLORIDE 0.9 % IV SOLN
Freq: Once | INTRAVENOUS | Status: AC
  Filled 2019-04-14: qty 250

## 2019-04-14 NOTE — Patient Instructions (Signed)
[facility] Discharge Instructions for Patients Receiving Chemotherapy  Today you received the following chemotherapy agents Opdivo  To help prevent nausea and vomiting after your treatment, we encourage you to take your nausea medication as directed   If you develop nausea and vomiting that is not controlled by your nausea medication, call the clinic.   BELOW ARE SYMPTOMS THAT SHOULD BE REPORTED IMMEDIATELY:  *FEVER GREATER THAN 100.5 F  *CHILLS WITH OR WITHOUT FEVER  NAUSEA AND VOMITING THAT IS NOT CONTROLLED WITH YOUR NAUSEA MEDICATION  *UNUSUAL SHORTNESS OF BREATH  *UNUSUAL BRUISING OR BLEEDING  TENDERNESS IN MOUTH AND THROAT WITH OR WITHOUT PRESENCE OF ULCERS  *URINARY PROBLEMS  *BOWEL PROBLEMS  UNUSUAL RASH Items with * indicate a potential emergency and should be followed up as soon as possible.  Feel free to call the clinic should you have any questions or concerns. The clinic phone number is [phone].  Please show the CHEMO ALERT CARD at check-in to the Emergency Department and triage nurse.   

## 2019-04-22 ENCOUNTER — Other Ambulatory Visit: Payer: Self-pay

## 2019-04-22 ENCOUNTER — Emergency Department (HOSPITAL_COMMUNITY): Admission: EM | Admit: 2019-04-22 | Discharge: 2019-04-22 | Discharge status: Absent without leave | Payer: Medicare HMO

## 2019-04-23 ENCOUNTER — Emergency Department (HOSPITAL_COMMUNITY)
Admission: EM | Admit: 2019-04-23 | Discharge: 2019-04-23 | Disposition: A | Discharge status: Home / Self Care | Payer: Medicare HMO | Attending: Emergency Medicine | Admitting: Emergency Medicine

## 2019-04-23 ENCOUNTER — Emergency Department (HOSPITAL_COMMUNITY): Payer: Medicare HMO

## 2019-04-23 ENCOUNTER — Encounter (HOSPITAL_COMMUNITY): Payer: Self-pay | Admitting: *Deleted

## 2019-04-23 ENCOUNTER — Other Ambulatory Visit: Payer: Self-pay

## 2019-04-23 DIAGNOSIS — Y999 Unspecified external cause status: Secondary | ICD-10-CM | POA: Insufficient documentation

## 2019-04-23 DIAGNOSIS — Z87891 Personal history of nicotine dependence: Secondary | ICD-10-CM | POA: Insufficient documentation

## 2019-04-23 DIAGNOSIS — Y9389 Activity, other specified: Secondary | ICD-10-CM | POA: Insufficient documentation

## 2019-04-23 DIAGNOSIS — S8011XA Contusion of right lower leg, initial encounter: Secondary | ICD-10-CM | POA: Diagnosis not present

## 2019-04-23 DIAGNOSIS — S80921A Unspecified superficial injury of right lower leg, initial encounter: Secondary | ICD-10-CM | POA: Diagnosis present

## 2019-04-23 DIAGNOSIS — J449 Chronic obstructive pulmonary disease, unspecified: Secondary | ICD-10-CM | POA: Diagnosis not present

## 2019-04-23 DIAGNOSIS — I2510 Atherosclerotic heart disease of native coronary artery without angina pectoris: Secondary | ICD-10-CM | POA: Insufficient documentation

## 2019-04-23 DIAGNOSIS — Y92009 Unspecified place in unspecified non-institutional (private) residence as the place of occurrence of the external cause: Secondary | ICD-10-CM | POA: Insufficient documentation

## 2019-04-23 DIAGNOSIS — W19XXXA Unspecified fall, initial encounter: Secondary | ICD-10-CM | POA: Insufficient documentation

## 2019-04-23 DIAGNOSIS — Z79899 Other long term (current) drug therapy: Secondary | ICD-10-CM | POA: Diagnosis not present

## 2019-04-23 DIAGNOSIS — I10 Essential (primary) hypertension: Secondary | ICD-10-CM | POA: Insufficient documentation
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
## 2019-04-23 NOTE — Discharge Instructions (Addendum)
Return if any problems.
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
## 2019-05-10 ENCOUNTER — Telehealth: Payer: Self-pay | Admitting: Medical Oncology

## 2019-05-10 NOTE — Telephone Encounter (Signed)
LVM for pt to call back.

## 2019-05-12 ENCOUNTER — Inpatient Hospital Stay: Payer: Medicare HMO

## 2019-05-12 ENCOUNTER — Inpatient Hospital Stay: Payer: Medicare HMO | Attending: Oncology | Admitting: Internal Medicine

## 2019-05-12 ENCOUNTER — Encounter: Payer: Self-pay | Admitting: Internal Medicine

## 2019-05-12 ENCOUNTER — Other Ambulatory Visit: Payer: Self-pay | Admitting: Medical Oncology

## 2019-05-12 ENCOUNTER — Other Ambulatory Visit: Payer: Self-pay | Admitting: Lab

## 2019-05-12 ENCOUNTER — Other Ambulatory Visit: Payer: Self-pay

## 2019-05-12 VITALS — BP 112/82 | HR 95 | Temp 98.9°F | Resp 17 | Ht 67.0 in | Wt 145.2 lb

## 2019-05-12 DIAGNOSIS — Z5112 Encounter for antineoplastic immunotherapy: Secondary | ICD-10-CM | POA: Insufficient documentation

## 2019-05-12 DIAGNOSIS — C3491 Malignant neoplasm of unspecified part of right bronchus or lung: Secondary | ICD-10-CM

## 2019-05-12 DIAGNOSIS — C342 Malignant neoplasm of middle lobe, bronchus or lung: Secondary | ICD-10-CM | POA: Diagnosis present

## 2019-05-12 DIAGNOSIS — C3490 Malignant neoplasm of unspecified part of unspecified bronchus or lung: Secondary | ICD-10-CM

## 2019-05-12 DIAGNOSIS — I10 Essential (primary) hypertension: Secondary | ICD-10-CM | POA: Diagnosis not present

## 2019-05-12 DIAGNOSIS — Z79899 Other long term (current) drug therapy: Secondary | ICD-10-CM | POA: Diagnosis not present

## 2019-05-12 LAB — CMP (CANCER CENTER ONLY)
ALT: 10 U/L (ref 0–44)
AST: 19 U/L (ref 15–41)
Albumin: 3.7 g/dL (ref 3.5–5.0)
Alkaline Phosphatase: 300 U/L — ABNORMAL HIGH (ref 38–126)
Anion gap: 10 (ref 5–15)
BUN: 22 mg/dL (ref 8–23)
CO2: 36 mmol/L — ABNORMAL HIGH (ref 22–32)
Calcium: 9.2 mg/dL (ref 8.9–10.3)
Chloride: 101 mmol/L (ref 98–111)
Creatinine: 0.71 mg/dL (ref 0.44–1.00)
GFR, Est AFR Am: >60 mL/min (ref >60)
GFR, Estimated: >60 mL/min (ref >60)
Glucose, Bld: 87 mg/dL (ref 70–99)
Potassium: 3.7 mmol/L (ref 3.5–5.1)
Sodium: 147 mmol/L — ABNORMAL HIGH (ref 135–145)
Total Bilirubin: 0.2 mg/dL — ABNORMAL LOW (ref 0.3–1.2)
Total Protein: 6.6 g/dL (ref 6.5–8.1)

## 2019-05-12 LAB — CBC WITH DIFFERENTIAL (CANCER CENTER ONLY)
Abs Immature Granulocytes: 0.03 10*3/uL (ref 0.00–0.07)
Basophils Absolute: 0 10*3/uL (ref 0.0–0.1)
Basophils Relative: 0 %
Eosinophils Absolute: 0.1 10*3/uL (ref 0.0–0.5)
Eosinophils Relative: 1 %
HCT: 40.9 % (ref 36.0–46.0)
Hemoglobin: 13.8 g/dL (ref 12.0–15.0)
Immature Granulocytes: 0 %
Lymphocytes Relative: 21 %
Lymphs Abs: 1.4 10*3/uL (ref 0.7–4.0)
MCH: 28.4 pg (ref 26.0–34.0)
MCHC: 33.7 g/dL (ref 30.0–36.0)
MCV: 84.2 fL (ref 80.0–100.0)
Monocytes Absolute: 0.4 10*3/uL (ref 0.1–1.0)
Monocytes Relative: 6 %
Neutro Abs: 4.9 10*3/uL (ref 1.7–7.7)
Neutrophils Relative %: 72 %
Platelet Count: 213 10*3/uL (ref 150–400)
RBC: 4.86 MIL/uL (ref 3.87–5.11)
RDW: 14.9 % (ref 11.5–15.5)
WBC Count: 6.8 10*3/uL (ref 4.0–10.5)
nRBC: 0 % (ref 0.0–0.2)

## 2019-05-12 LAB — TSH: TSH: 2.58 u[IU]/mL (ref 0.308–3.960)

## 2019-05-12 MED ORDER — SODIUM CHLORIDE 0.9 % IV SOLN
Freq: Once | INTRAVENOUS | Status: AC
  Filled 2019-05-12: qty 250

## 2019-05-12 MED ORDER — SODIUM CHLORIDE 0.9 % IV SOLN
480.0000 mg | Freq: Once | INTRAVENOUS | Status: AC
  Administered 2019-05-12: 12:00:00 480 mg via INTRAVENOUS
  Filled 2019-05-12: qty 48

## 2019-05-12 NOTE — Progress Notes (Signed)
[place] [phone]   [phone]  OFFICE PROGRESS NOTE  Barry Dienes, NP No address on file  DIAGNOSIS: Recurrent non-small cell lung cancer initially diagnosed as stage IIIA (T1b, N2, M0) non-small cell lung cancer presented with right middle lobe pulmonary nodule, mediastinal lymphadenopathy and highly suspicious for small nodule in the left upper lobe that could change her stage to stage IV that could present another synchronous primary lesion in the left upper lobe. This was diagnosed in September 2017.  PRIOR THERAPY:  1) Concurrent chemoradiation with weekly carboplatin for AUC of 2 and paclitaxel 45 MG/M2 status post 6 cycles last dose was given 02/25/2016 with partial response. 2) Consolidation chemotherapy with carboplatin for AUC of 5 and paclitaxel 175 MG/M2 every 3 weeks with Neulasta support. First dose 04/22/2016. Status post 3 cycles.  CURRENT THERAPY: Second line treatment with immunotherapy with Nivolumab 480 mg IV every 4 weeks status post 26 cycles..  INTERVAL HISTORY: Courtney Zuniga 69 y.o. female returns to the clinic today for follow-up visit.  The patient is feeling fine today with no concerning complaints.  She denied having any chest pain, shortness of breath, cough or hemoptysis.  The patient denied having any nausea, vomiting, diarrhea or constipation.  She denied having any headache or visual changes.  She has no fever or chills.  She continues to tolerate her treatment with nivolumab fairly well.  The patient is here today for evaluation before starting cycle #27.  MEDICAL HISTORY: Past Medical History:  Diagnosis Date  . Anemia    as a young woman  . Arthritis    osteoartritis  . Asthma   . Brain tumor (benign) ([place]) 2005 [facility]   Benign  . Chronic headaches   . Chronic hip pain   . Chronic pain   . COPD (chronic obstructive pulmonary disease) ([place])   . Coronary artery disease   . Depression   . Depression  05/15/2016  . Encounter for antineoplastic chemotherapy 01/10/2016  . GERD (gastroesophageal reflux disease)   . Hypertension   . Lung cancer ([place]) dx'd 01/2016   currently on chemo and radiation   . NSTEMI (non-ST elevated myocardial infarction) ([place]) yrs ago  . On home O2    qhs 2 liters at hs and prn  . Pneumonia last time 2 yrs ago  . Shortness of breath dyspnea    with activity    ALLERGIES:  has No Known Allergies.  MEDICATIONS:  Current Outpatient Medications  Medication Sig Dispense Refill  . acetaminophen (TYLENOL) 325 MG tablet Take 2 tablets (650 mg total) by mouth every 6 (six) hours as needed for mild pain, fever or headache. 12 tablet 2  . albuterol (PROVENTIL HFA;VENTOLIN HFA) 108 (90 BASE) MCG/ACT inhaler Inhale 2 puffs into the lungs every 4 (four) hours as needed for shortness of breath.     Marland Kitchen BREO ELLIPTA 100-25 MCG/INH AEPB Inhale 1 puff into the lungs daily.     . Cholecalciferol (VITAMIN D3) 125 MCG (5000 UT) CAPS Take 1 capsule by mouth daily.  0  . cyclobenzaprine (FLEXERIL) 10 MG tablet Take 1 tablet (10 mg total) by mouth 2 (two) times daily. *MAy take one additional tablet as needed for muscle spasms 60 tablet 2  . dextromethorphan-guaiFENesin (MUCINEX DM) 30-600 MG 12hr tablet Take 1 tablet by mouth 2 (two) times daily. 40 tablet 0  . diclofenac sodium (VOLTAREN) 1 % GEL Apply topically 4 (four) times daily as needed (massge  gel into affected area(s) as needed for pain).   1  . ENSURE (ENSURE) Take 237 mLs by mouth 3 (three) times daily between meals.    Marland Kitchen ipratropium-albuterol (DUONEB) 0.5-2.5 (3) MG/3ML SOLN Take 3 mLs by nebulization 3 (three) times daily. 360 mL 2  . meloxicam (MOBIC) 15 MG tablet Take 15 mg by mouth daily.    . metoprolol tartrate (LOPRESSOR) 25 MG tablet Take 1 tablet (25 mg total) by mouth 2 (two) times daily. 60 tablet 1  . nicotine (NICODERM CQ - DOSED IN MG/24 HOURS) 14 mg/24hr patch Place 1 patch (14 mg total) onto the skin daily. 28  patch 0  . oseltamivir (TAMIFLU) 75 MG capsule Take 1 capsule (75 mg total) by mouth 2 (two) times daily. 6 capsule 0  . oxyCODONE-acetaminophen (PERCOCET/ROXICET) 5-325 MG tablet Take 1 tablet by mouth every 8 (eight) hours as needed for moderate pain. 12 tablet 0  . pantoprazole (PROTONIX) 40 MG tablet Take 1 tablet (40 mg total) by mouth daily. 30 tablet 3  . ROBITUSSIN 12 HOUR COUGH 30 MG/5ML liquid Take 30 mg by mouth every 4 (four) hours as needed for cough.     . topiramate (TOPAMAX) 25 MG tablet Take 1 tablet by mouth daily at bedtime X 1 week; then increase to 25mg  BID. 45 tablet 2   No current facility-administered medications for this visit.    SURGICAL HISTORY:  Past Surgical History:  Procedure Laterality Date  . CHOLECYSTECTOMY    . COLONOSCOPY  2015   Results requested from [facility]  . COLONOSCOPY    . ESOPHAGOGASTRODUODENOSCOPY N/A 08/14/2015   Procedure: ESOPHAGOGASTRODUODENOSCOPY (EGD);  Surgeon: Daneil Dolin, MD;  [facility];  Service: Endoscopy;  Laterality: N/A;  215   . ESOPHAGOGASTRODUODENOSCOPY (EGD) WITH PROPOFOL N/A 09/13/2015   Procedure: ESOPHAGOGASTRODUODENOSCOPY (EGD) WITH PROPOFOL;  Surgeon: Milus Banister, MD;  [facility];  Service: Endoscopy;  Laterality: N/A;  . EUS N/A 03/12/2017   Procedure: UPPER ENDOSCOPIC ULTRASOUND (EUS) RADIAL;  Surgeon: Milus Banister, MD;  [facility];  Service: Endoscopy;  Laterality: N/A;  . TUMOR REMOVAL  2005   Benign  . UPPER ESOPHAGEAL ENDOSCOPIC ULTRASOUND (EUS)  09/13/2015   Procedure: UPPER ESOPHAGEAL ENDOSCOPIC ULTRASOUND (EUS);  Surgeon: Milus Banister, MD;  Location[facility];  Service: Endoscopy;;  . VIDEO BRONCHOSCOPY WITH ENDOBRONCHIAL NAVIGATION N/A 12/31/2015   Procedure: VIDEO BRONCHOSCOPY WITH ENDOBRONCHIAL NAVIGATION;  Surgeon: Melrose Nakayama, MD;  Location: [place];  Service: Thoracic;  Laterality: N/A;  . VIDEO BRONCHOSCOPY WITH ENDOBRONCHIAL  ULTRASOUND N/A 11/08/2015   Procedure: VIDEO BRONCHOSCOPY WITH ENDOBRONCHIAL ULTRASOUND;  Surgeon: Ivin Poot, MD;  Location: [facility] OR;  Service: Thoracic;  Laterality: N/A;    REVIEW OF SYSTEMS:  A comprehensive review of systems was negative.   PHYSICAL EXAMINATION: General appearance: alert, cooperative and no distress Head: Normocephalic, without obvious abnormality, atraumatic Neck: no adenopathy, no JVD, supple, symmetrical, trachea midline and thyroid not enlarged, symmetric, no tenderness/mass/nodules Lymph nodes: Cervical, supraclavicular, and axillary nodes normal. Resp: clear to auscultation bilaterally Back: symmetric, no curvature. ROM normal. No CVA tenderness. Cardio: regular rate and rhythm, S1, S2 normal, no murmur, click, rub or gallop GI: soft, non-tender; bowel sounds normal; no masses,  no organomegaly Extremities: extremities normal, atraumatic, no cyanosis or edema  ECOG PERFORMANCE STATUS: 1 - Symptomatic but completely ambulatory  Blood pressure 112/82, pulse 95, temperature 98.9 F (37.2 C), temperature source Oral, resp. rate 17, height  5\' 7"  (1.702 m), weight 145 lb 3.2 oz (65.9 kg), SpO2 97 %.  LABORATORY DATA: Lab Results  Component Value Date   WBC 6.8 05/12/2019   HGB 13.8 05/12/2019   HCT 40.9 05/12/2019   MCV 84.2 05/12/2019   PLT 213 05/12/2019      Chemistry      Component Value Date/Time   NA 145 04/14/2019 1406   NA 144 04/09/2017 0948   K 3.8 04/14/2019 1406   K 4.3 04/09/2017 0948   CL 107 04/14/2019 1406   CO2 28 04/14/2019 1406   CO2 25 04/09/2017 0948   BUN 18 04/14/2019 1406   BUN 16.8 04/09/2017 0948   CREATININE 0.82 04/14/2019 1406   CREATININE 0.8 04/09/2017 0948      Component Value Date/Time   CALCIUM 9.5 04/14/2019 1406   CALCIUM 9.1 04/09/2017 0948   ALKPHOS 361 (H) 04/14/2019 1406   ALKPHOS 216 (H) 04/09/2017 0948   AST 37 04/14/2019 1406   AST 12 04/09/2017 0948   ALT 43 04/14/2019 1406   ALT 13 04/09/2017  0948   BILITOT <0.2 (L) 04/14/2019 1406   BILITOT 0.22 04/09/2017 0948       RADIOGRAPHIC STUDIES:-   DG Femur Min 2 Views Right  Result Date: 04/23/2019 CLINICAL DATA:  Pain after fall EXAM: RIGHT FEMUR 2 VIEWS COMPARISON:  None. FINDINGS: There is an osteophyte at the junction of the right lateral femoral head and neck, similar since September of 2012. No hip fractures noted. No femoral fracture is seen. Vascular calcifications are seen. No dislocations. Chondrocalcinosis in the lateral compartment of the knee. IMPRESSION: 1. No acute fractures. 2. Vascular calcifications. 3. 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 Electronically Signed   By: Dorise Bullion III M.D   On: 04/23/2019 14:27    ASSESSMENT AND PLAN:  This is a very pleasant 70 years old African-American female with a recurrent non-small cell lung cancer initially diagnosed as stage IIIA non-small cell lung cancer status post concurrent chemoradiation followed by consolidation chemotherapy with carboplatin and paclitaxel for 3 cycles. The patient was followed by observation but restaging imaging studies showed evidence for disease progression. She was started on second line treatment with immunotherapy with Nivolumab 480 mg IV every 4 weeks status post 26 cycles.   She continues to tolerate her treatment well with no concerning adverse effects. I recommended for her to proceed with cycle #27 today as planned. I will see the patient back for follow-up visit in 4 weeks for evaluation before the next cycle of her treatment. She was advised to call immediately if she has any other concerning symptoms in the interval. The patient voices understanding of current disease status and treatment options and is in agreement with the current care plan. All questions were answered. The patient knows to call the clinic with any problems, questions or concerns. We can certainly see the patient much sooner if  necessary.  Disclaimer: This note was dictated with voice recognition software. Similar sounding words can inadvertently be transcribed and may not be corrected upon review.

## 2019-05-12 NOTE — Patient Instructions (Signed)
[facility] Discharge Instructions for Patients Receiving Chemotherapy  Today you received the following chemotherapy agents: nivolumab.  To help prevent nausea and vomiting after your treatment, we encourage you to take your nausea medication as directed.   If you develop nausea and vomiting that is not controlled by your nausea medication, call the clinic.   BELOW ARE SYMPTOMS THAT SHOULD BE REPORTED IMMEDIATELY:  *FEVER GREATER THAN 100.5 F  *CHILLS WITH OR WITHOUT FEVER  NAUSEA AND VOMITING THAT IS NOT CONTROLLED WITH YOUR NAUSEA MEDICATION  *UNUSUAL SHORTNESS OF BREATH  *UNUSUAL BRUISING OR BLEEDING  TENDERNESS IN MOUTH AND THROAT WITH OR WITHOUT PRESENCE OF ULCERS  *URINARY PROBLEMS  *BOWEL PROBLEMS  UNUSUAL RASH Items with * indicate a potential emergency and should be followed up as soon as possible.  Feel free to call the clinic should you have any questions or concerns. The clinic phone number is [phone].  Please show the CHEMO ALERT CARD at check-in to the Emergency Department and triage nurse.   

## 2019-05-17 ENCOUNTER — Telehealth: Payer: Self-pay | Admitting: Internal Medicine

## 2019-05-17 NOTE — Telephone Encounter (Signed)
Scheduled per los. Called and left msg. Mailed printout  °

## 2019-06-09 ENCOUNTER — Inpatient Hospital Stay: Payer: Medicare HMO | Attending: Oncology | Admitting: Internal Medicine

## 2019-06-09 ENCOUNTER — Inpatient Hospital Stay: Payer: Medicare HMO

## 2019-06-09 ENCOUNTER — Other Ambulatory Visit: Payer: Self-pay

## 2019-06-09 ENCOUNTER — Encounter: Payer: Self-pay | Admitting: Internal Medicine

## 2019-06-09 VITALS — BP 142/83 | HR 91 | Temp 98.2°F | Resp 19 | Ht 67.0 in | Wt 145.9 lb

## 2019-06-09 DIAGNOSIS — C3491 Malignant neoplasm of unspecified part of right bronchus or lung: Secondary | ICD-10-CM | POA: Diagnosis not present

## 2019-06-09 DIAGNOSIS — Z9221 Personal history of antineoplastic chemotherapy: Secondary | ICD-10-CM

## 2019-06-09 DIAGNOSIS — Z79899 Other long term (current) drug therapy: Secondary | ICD-10-CM | POA: Insufficient documentation

## 2019-06-09 DIAGNOSIS — Z923 Personal history of irradiation: Secondary | ICD-10-CM | POA: Diagnosis not present

## 2019-06-09 DIAGNOSIS — C342 Malignant neoplasm of middle lobe, bronchus or lung: Secondary | ICD-10-CM

## 2019-06-09 DIAGNOSIS — Z5112 Encounter for antineoplastic immunotherapy: Secondary | ICD-10-CM | POA: Diagnosis not present

## 2019-06-09 DIAGNOSIS — C3490 Malignant neoplasm of unspecified part of unspecified bronchus or lung: Secondary | ICD-10-CM

## 2019-06-09 DIAGNOSIS — R911 Solitary pulmonary nodule: Secondary | ICD-10-CM

## 2019-06-09 DIAGNOSIS — I10 Essential (primary) hypertension: Secondary | ICD-10-CM

## 2019-06-09 LAB — CBC WITH DIFFERENTIAL (CANCER CENTER ONLY)
Abs Immature Granulocytes: 0.01 10*3/uL (ref 0.00–0.07)
Basophils Absolute: 0 10*3/uL (ref 0.0–0.1)
Basophils Relative: 1 %
Eosinophils Absolute: 0.1 10*3/uL (ref 0.0–0.5)
Eosinophils Relative: 2 %
HCT: 40.2 % (ref 36.0–46.0)
Hemoglobin: 13.9 g/dL (ref 12.0–15.0)
Immature Granulocytes: 0 %
Lymphocytes Relative: 30 %
Lymphs Abs: 1.7 10*3/uL (ref 0.7–4.0)
MCH: 29.1 pg (ref 26.0–34.0)
MCHC: 34.6 g/dL (ref 30.0–36.0)
MCV: 84.3 fL (ref 80.0–100.0)
Monocytes Absolute: 0.5 10*3/uL (ref 0.1–1.0)
Monocytes Relative: 8 %
Neutro Abs: 3.4 10*3/uL (ref 1.7–7.7)
Neutrophils Relative %: 59 %
Platelet Count: 177 10*3/uL (ref 150–400)
RBC: 4.77 MIL/uL (ref 3.87–5.11)
RDW: 14.7 % (ref 11.5–15.5)
WBC Count: 5.8 10*3/uL (ref 4.0–10.5)
nRBC: 0 % (ref 0.0–0.2)

## 2019-06-09 LAB — TSH: TSH: 2.544 u[IU]/mL (ref 0.308–3.960)

## 2019-06-09 LAB — CMP (CANCER CENTER ONLY)
ALT: 13 U/L (ref 0–44)
AST: 19 U/L (ref 15–41)
Albumin: 3.7 g/dL (ref 3.5–5.0)
Alkaline Phosphatase: 278 U/L — ABNORMAL HIGH (ref 38–126)
Anion gap: 11 (ref 5–15)
BUN: 14 mg/dL (ref 8–23)
CO2: 31 mmol/L (ref 22–32)
Calcium: 8.8 mg/dL — ABNORMAL LOW (ref 8.9–10.3)
Chloride: 104 mmol/L (ref 98–111)
Creatinine: 0.68 mg/dL (ref 0.44–1.00)
GFR, Est AFR Am: >60 mL/min (ref >60)
GFR, Estimated: >60 mL/min (ref >60)
Glucose, Bld: 71 mg/dL (ref 70–99)
Potassium: 3.7 mmol/L (ref 3.5–5.1)
Sodium: 146 mmol/L — ABNORMAL HIGH (ref 135–145)
Total Bilirubin: 0.2 mg/dL — ABNORMAL LOW (ref 0.3–1.2)
Total Protein: 7 g/dL (ref 6.5–8.1)

## 2019-06-09 MED ORDER — SODIUM CHLORIDE 0.9 % IV SOLN
Freq: Once | INTRAVENOUS | Status: AC
  Filled 2019-06-09: qty 250

## 2019-06-09 MED ORDER — SODIUM CHLORIDE 0.9 % IV SOLN
480.0000 mg | Freq: Once | INTRAVENOUS | Status: AC
  Administered 2019-06-09: 480 mg via INTRAVENOUS
  Filled 2019-06-09: qty 48

## 2019-06-09 NOTE — Patient Instructions (Signed)
[facility] Discharge Instructions for Patients Receiving Chemotherapy  Today you received the following Immunotherapy agent: Nivolumab (Opdivo)  To help prevent nausea and vomiting after your treatment, we encourage you to take your nausea medication as directed by your MD.   If you develop nausea and vomiting that is not controlled by your nausea medication, call the clinic.   BELOW ARE SYMPTOMS THAT SHOULD BE REPORTED IMMEDIATELY:  *FEVER GREATER THAN 100.5 F  *CHILLS WITH OR WITHOUT FEVER  NAUSEA AND VOMITING THAT IS NOT CONTROLLED WITH YOUR NAUSEA MEDICATION  *UNUSUAL SHORTNESS OF BREATH  *UNUSUAL BRUISING OR BLEEDING  TENDERNESS IN MOUTH AND THROAT WITH OR WITHOUT PRESENCE OF ULCERS  *URINARY PROBLEMS  *BOWEL PROBLEMS  UNUSUAL RASH Items with * indicate a potential emergency and should be followed up as soon as possible.  Feel free to call the clinic should you have any questions or concerns. The clinic phone number is [phone].  Please show the [facility] at check-in to the Emergency Department and triage nurse.  Coronavirus (COVID-19) Are you at risk?  Are you at risk for the Coronavirus (COVID-19)?  To be considered HIGH RISK for Coronavirus (COVID-19), you have to meet the following criteria:  . Traveled to [place], [place], [place], [place] or [place]; or in the [place] to [place], [place], [place], or [place]; and have fever, cough, and shortness of breath within the last 2 weeks of travel OR . Been in close contact with a person diagnosed with COVID-19 within the last 2 weeks and have fever, cough, and shortness of breath . IF YOU DO NOT MEET THESE CRITERIA, YOU ARE CONSIDERED LOW RISK FOR COVID-19.  What to do if you are HIGH RISK for COVID-19?  Marland Kitchen If you are having a medical emergency, call 911. . Seek medical care right away. Before you go to a doctor's office, urgent care or emergency department, call ahead and tell  them about your recent travel, contact with someone diagnosed with COVID-19, and your symptoms. You should receive instructions from your physician's office regarding next steps of care.  . When you arrive at healthcare provider, tell the healthcare staff immediately you have returned from visiting [place], [place], [place], [place] or [place]; or traveled in the [place] to [place], [place], [place], or [place]; in the last two weeks or you have been in close contact with a person diagnosed with COVID-19 in the last 2 weeks.   . Tell the health care staff about your symptoms: fever, cough and shortness of breath. . After you have been seen by a medical provider, you will be either: o Tested for (COVID-19) and discharged home on quarantine except to seek medical care if symptoms worsen, and asked to  - Stay home and avoid contact with others until you get your results (4-5 days)  - Avoid travel on public transportation if possible (such as bus, train, or airplane) or o Sent to the Emergency Department by EMS for evaluation, COVID-19 testing, and possible admission depending on your condition and test results.  What to do if you are LOW RISK for COVID-19?  Reduce your risk of any infection by using the same precautions used for avoiding the common cold or flu:  Marland Kitchen Wash your hands often with soap and warm water for at least 20 seconds.  If soap and water are not readily available, use an alcohol-based hand sanitizer with at least 60% alcohol.  Marland Kitchen  If coughing or sneezing, cover your mouth and nose by coughing or sneezing into the elbow areas of your shirt or coat, into a tissue or into your sleeve (not your hands). . Avoid shaking hands with others and consider head nods or verbal greetings only. . Avoid touching your eyes, nose, or mouth with unwashed hands.  . Avoid close contact with people who are sick. . Avoid places or events with large numbers of people in one location, like concerts or  sporting events. . Carefully consider travel plans you have or are making. . If you are planning any travel outside or inside the [place], visit the CDC's Travelers' Health webpage for the latest health notices. . If you have some symptoms but not all symptoms, continue to monitor at home and seek medical attention if your symptoms worsen. . If you are having a medical emergency, call 911.   [place] / e-Visit: [email or web address]         [facility]: [facility]: [phone]                   [facility]: [phone]

## 2019-06-09 NOTE — Progress Notes (Signed)
[name] [phone]   [phone]  OFFICE PROGRESS NOTE  Barry Dienes, NP No address on file  DIAGNOSIS: Recurrent non-small cell lung cancer initially diagnosed as stage IIIA (T1b, N2, M0) non-small cell lung cancer presented with right middle lobe pulmonary nodule, mediastinal lymphadenopathy and highly suspicious for small nodule in the left upper lobe that could change her stage to stage IV that could present another synchronous primary lesion in the left upper lobe. This was diagnosed in September 2017.  PRIOR THERAPY:  1) Concurrent chemoradiation with weekly carboplatin for AUC of 2 and paclitaxel 45 MG/M2 status post 6 cycles last dose was given 02/25/2016 with partial response. 2) Consolidation chemotherapy with carboplatin for AUC of 5 and paclitaxel 175 MG/M2 every 3 weeks with Neulasta support. First dose 04/22/2016. Status post 3 cycles.  CURRENT THERAPY: Second line treatment with immunotherapy with Nivolumab 480 mg IV every 4 weeks status post 27 cycles..  INTERVAL HISTORY: Courtney Zuniga 69 y.o. female returns to the clinic today for follow-up visit.  The patient is feeling fine today with no concerning complaints.  She denied having any chest pain but has shortness of breath with exertion with no cough or hemoptysis.  She denied having any fever or chills.  She has no nausea, vomiting, diarrhea or constipation.  She continues to have generalized aching pain.  She is in the process of changing primary care physician.  The patient has no recent weight loss or night sweats.  She is here today for evaluation before starting cycle #28 of her treatment.   MEDICAL HISTORY: Past Medical History:  Diagnosis Date  . Anemia    as a young woman  . Arthritis    osteoartritis  . Asthma   . Brain tumor (benign) ([place]) 2005 [facility]   Benign  . Chronic headaches   . Chronic hip pain   . Chronic pain   . COPD (chronic obstructive pulmonary  disease) ([place])   . Coronary artery disease   . Depression   . Depression 05/15/2016  . Encounter for antineoplastic chemotherapy 01/10/2016  . GERD (gastroesophageal reflux disease)   . Hypertension   . Lung cancer ([place]) dx'd 01/2016   currently on chemo and radiation   . NSTEMI (non-ST elevated myocardial infarction) ([place]) yrs ago  . On home O2    qhs 2 liters at hs and prn  . Pneumonia last time 2 yrs ago  . Shortness of breath dyspnea    with activity    ALLERGIES:  has No Known Allergies.  MEDICATIONS:  Current Outpatient Medications  Medication Sig Dispense Refill  . acetaminophen (TYLENOL) 325 MG tablet Take 2 tablets (650 mg total) by mouth every 6 (six) hours as needed for mild pain, fever or headache. (Patient not taking: Reported on 05/12/2019) 12 tablet 2  . albuterol (PROVENTIL HFA;VENTOLIN HFA) 108 (90 BASE) MCG/ACT inhaler Inhale 2 puffs into the lungs every 4 (four) hours as needed for shortness of breath.     Marland Kitchen BREO ELLIPTA 100-25 MCG/INH AEPB Inhale 1 puff into the lungs daily.     . Cholecalciferol (VITAMIN D3) 125 MCG (5000 UT) CAPS Take 1 capsule by mouth daily.  0  . cyclobenzaprine (FLEXERIL) 10 MG tablet Take 1 tablet (10 mg total) by mouth 2 (two) times daily. *MAy take one additional tablet as needed for muscle spasms 60 tablet 2  . dextromethorphan-guaiFENesin (MUCINEX DM) 30-600 MG 12hr tablet Take 1 tablet by mouth  2 (two) times daily. 40 tablet 0  . diclofenac sodium (VOLTAREN) 1 % GEL Apply topically 4 (four) times daily as needed (massge gel into affected area(s) as needed for pain).   1  . ENSURE (ENSURE) Take 237 mLs by mouth 3 (three) times daily between meals.    Marland Kitchen ipratropium-albuterol (DUONEB) 0.5-2.5 (3) MG/3ML SOLN Take 3 mLs by nebulization 3 (three) times daily. 360 mL 2  . meloxicam (MOBIC) 15 MG tablet Take 15 mg by mouth daily.    . metoprolol tartrate (LOPRESSOR) 25 MG tablet Take 1 tablet (25 mg total) by mouth 2 (two) times daily. 60 tablet 1   . nicotine (NICODERM CQ - DOSED IN MG/24 HOURS) 14 mg/24hr patch Place 1 patch (14 mg total) onto the skin daily. 28 patch 0  . oxyCODONE-acetaminophen (PERCOCET/ROXICET) 5-325 MG tablet Take 1 tablet by mouth every 8 (eight) hours as needed for moderate pain. (Patient not taking: Reported on 05/12/2019) 12 tablet 0  . pantoprazole (PROTONIX) 40 MG tablet Take 1 tablet (40 mg total) by mouth daily. 30 tablet 3  . ROBITUSSIN 12 HOUR COUGH 30 MG/5ML liquid Take 30 mg by mouth every 4 (four) hours as needed for cough.     . topiramate (TOPAMAX) 25 MG tablet Take 1 tablet by mouth daily at bedtime X 1 week; then increase to 25mg  BID. 45 tablet 2   No current facility-administered medications for this visit.    SURGICAL HISTORY:  Past Surgical History:  Procedure Laterality Date  . CHOLECYSTECTOMY    . COLONOSCOPY  2015   Results requested from [facility]  . COLONOSCOPY    . ESOPHAGOGASTRODUODENOSCOPY N/A 08/14/2015   Procedure: ESOPHAGOGASTRODUODENOSCOPY (EGD);  Surgeon: Daneil Dolin, MD;  [facility];  Service: Endoscopy;  Laterality: N/A;  215   . ESOPHAGOGASTRODUODENOSCOPY (EGD) WITH PROPOFOL N/A 09/13/2015   Procedure: ESOPHAGOGASTRODUODENOSCOPY (EGD) WITH PROPOFOL;  Surgeon: Milus Banister, MD;  [facility];  Service: Endoscopy;  Laterality: N/A;  . EUS N/A 03/12/2017   Procedure: UPPER ENDOSCOPIC ULTRASOUND (EUS) RADIAL;  Surgeon: Milus Banister, MD;  [facility];  Service: Endoscopy;  Laterality: N/A;  . TUMOR REMOVAL  2005   Benign  . UPPER ESOPHAGEAL ENDOSCOPIC ULTRASOUND (EUS)  09/13/2015   Procedure: UPPER ESOPHAGEAL ENDOSCOPIC ULTRASOUND (EUS);  Surgeon: Milus Banister, MD;  Location: [facility];  Service: Endoscopy;;  . VIDEO BRONCHOSCOPY WITH ENDOBRONCHIAL NAVIGATION N/A 12/31/2015   Procedure: VIDEO BRONCHOSCOPY WITH ENDOBRONCHIAL NAVIGATION;  Surgeon: Melrose Nakayama, MD;  Location: [facility];  Service: Thoracic;  Laterality:  N/A;  . VIDEO BRONCHOSCOPY WITH ENDOBRONCHIAL ULTRASOUND N/A 11/08/2015   Procedure: VIDEO BRONCHOSCOPY WITH ENDOBRONCHIAL ULTRASOUND;  Surgeon: Ivin Poot, MD;  [facility];  Service: Thoracic;  Laterality: N/A;    REVIEW OF SYSTEMS:  A comprehensive review of systems was negative except for: Respiratory: positive for dyspnea on exertion Musculoskeletal: positive for arthralgias   PHYSICAL EXAMINATION: General appearance: alert, cooperative, fatigued and no distress Head: Normocephalic, without obvious abnormality, atraumatic Neck: no adenopathy, no JVD, supple, symmetrical, trachea midline and thyroid not enlarged, symmetric, no tenderness/mass/nodules Lymph nodes: Cervical, supraclavicular, and axillary nodes normal. Resp: clear to auscultation bilaterally Back: symmetric, no curvature. ROM normal. No CVA tenderness. Cardio: regular rate and rhythm, S1, S2 normal, no murmur, click, rub or gallop GI: soft, non-tender; bowel sounds normal; no masses,  no organomegaly Extremities: extremities normal, atraumatic, no cyanosis or edema  ECOG PERFORMANCE STATUS: 1 - Symptomatic  but completely ambulatory  Blood pressure (!) 142/83, pulse 91, temperature 98.2 F (36.8 C), temperature source Oral, resp. rate 19, height 5\' 7"  (1.702 m), weight 145 lb 14.4 oz (66.2 kg), SpO2 90 %.  LABORATORY DATA: Lab Results  Component Value Date   WBC 5.8 06/09/2019   HGB 13.9 06/09/2019   HCT 40.2 06/09/2019   MCV 84.3 06/09/2019   PLT 177 06/09/2019      Chemistry      Component Value Date/Time   NA 147 (H) 05/12/2019 1008   NA 144 04/09/2017 0948   K 3.7 05/12/2019 1008   K 4.3 04/09/2017 0948   CL 101 05/12/2019 1008   CO2 36 (H) 05/12/2019 1008   CO2 25 04/09/2017 0948   BUN 22 05/12/2019 1008   BUN 16.8 04/09/2017 0948   CREATININE 0.71 05/12/2019 1008   CREATININE 0.8 04/09/2017 0948      Component Value Date/Time   CALCIUM 9.2 05/12/2019 1008   CALCIUM 9.1 04/09/2017 0948     ALKPHOS 300 (H) 05/12/2019 1008   ALKPHOS 216 (H) 04/09/2017 0948   AST 19 05/12/2019 1008   AST 12 04/09/2017 0948   ALT 10 05/12/2019 1008   ALT 13 04/09/2017 0948   BILITOT 0.2 (L) 05/12/2019 1008   BILITOT 0.22 04/09/2017 0948       RADIOGRAPHIC STUDIES:-   No results found.  ASSESSMENT AND PLAN:  This is a very pleasant 69 years old African-American female with a recurrent non-small cell lung cancer initially diagnosed as stage IIIA non-small cell lung cancer status post concurrent chemoradiation followed by consolidation chemotherapy with carboplatin and paclitaxel for 3 cycles. The patient was followed by observation but restaging imaging studies showed evidence for disease progression. She was started on second line treatment with immunotherapy with Nivolumab 480 mg IV every 4 weeks status post 27 cycles.   The patient continues to tolerate her treatment well with no concerning adverse effects. I recommended for her to proceed with cycle #28 today as planned. I will see her back for follow-up visit in 4 weeks for evaluation and repeat CT scan of the chest, abdomen pelvis for restaging of her disease. The patient was advised to call immediately if she has any concerning symptoms in the interval. The patient voices understanding of current disease status and treatment options and is in agreement with the current care plan. All questions were answered. The patient knows to call the clinic with any problems, questions or concerns. We can certainly see the patient much sooner if necessary.  Disclaimer: This note was dictated with voice recognition software. Similar sounding words can inadvertently be transcribed and may not be corrected upon review.

## 2019-06-10 ENCOUNTER — Telehealth: Payer: Self-pay | Admitting: Internal Medicine

## 2019-06-10 NOTE — Telephone Encounter (Signed)
Scheduled per los. Called and left msg. Mailed printout  °

## 2019-06-15 ENCOUNTER — Other Ambulatory Visit: Payer: Self-pay

## 2019-06-15 ENCOUNTER — Encounter (HOSPITAL_COMMUNITY): Payer: Self-pay | Admitting: Emergency Medicine

## 2019-06-15 ENCOUNTER — Emergency Department (HOSPITAL_COMMUNITY)
Admission: EM | Admit: 2019-06-15 | Discharge: 2019-06-15 | Disposition: A | Discharge status: Home / Self Care | Payer: Medicare HMO | Attending: Emergency Medicine | Admitting: Emergency Medicine

## 2019-06-15 ENCOUNTER — Emergency Department (HOSPITAL_COMMUNITY): Payer: Medicare HMO

## 2019-06-15 DIAGNOSIS — R079 Chest pain, unspecified: Secondary | ICD-10-CM | POA: Diagnosis present

## 2019-06-15 DIAGNOSIS — Z9221 Personal history of antineoplastic chemotherapy: Secondary | ICD-10-CM | POA: Insufficient documentation

## 2019-06-15 DIAGNOSIS — Z923 Personal history of irradiation: Secondary | ICD-10-CM | POA: Insufficient documentation

## 2019-06-15 DIAGNOSIS — M25512 Pain in left shoulder: Secondary | ICD-10-CM | POA: Diagnosis not present

## 2019-06-15 DIAGNOSIS — M542 Cervicalgia: Secondary | ICD-10-CM | POA: Insufficient documentation

## 2019-06-15 DIAGNOSIS — I10 Essential (primary) hypertension: Secondary | ICD-10-CM | POA: Insufficient documentation

## 2019-06-15 DIAGNOSIS — Z87891 Personal history of nicotine dependence: Secondary | ICD-10-CM | POA: Insufficient documentation

## 2019-06-15 DIAGNOSIS — I252 Old myocardial infarction: Secondary | ICD-10-CM | POA: Insufficient documentation

## 2019-06-15 DIAGNOSIS — Z85118 Personal history of other malignant neoplasm of bronchus and lung: Secondary | ICD-10-CM | POA: Diagnosis not present

## 2019-06-15 DIAGNOSIS — J449 Chronic obstructive pulmonary disease, unspecified: Secondary | ICD-10-CM | POA: Diagnosis not present

## 2019-06-15 DIAGNOSIS — R0781 Pleurodynia: Secondary | ICD-10-CM

## 2019-06-15 LAB — BASIC METABOLIC PANEL
Anion gap: 11 (ref 5–15)
BUN: 18 mg/dL (ref 8–23)
CO2: 28 mmol/L (ref 22–32)
Calcium: 9.8 mg/dL (ref 8.9–10.3)
Chloride: 103 mmol/L (ref 98–111)
Creatinine, Ser: 0.54 mg/dL (ref 0.44–1.00)
GFR calc Af Amer: >60 mL/min (ref >60)
GFR calc non Af Amer: >60 mL/min (ref >60)
Glucose, Bld: 110 mg/dL — ABNORMAL HIGH (ref 70–99)
Potassium: 3.6 mmol/L (ref 3.5–5.1)
Sodium: 142 mmol/L (ref 135–145)

## 2019-06-15 LAB — CBC
HCT: 44 % (ref 36.0–46.0)
Hemoglobin: 15 g/dL (ref 12.0–15.0)
MCH: 29 pg (ref 26.0–34.0)
MCHC: 34.1 g/dL (ref 30.0–36.0)
MCV: 84.9 fL (ref 80.0–100.0)
Platelets: 246 10*3/uL (ref 150–400)
RBC: 5.18 MIL/uL — ABNORMAL HIGH (ref 3.87–5.11)
RDW: 15.1 % (ref 11.5–15.5)
WBC: 7.6 10*3/uL (ref 4.0–10.5)
nRBC: 0 % (ref 0.0–0.2)

## 2019-06-15 MED ORDER — METHOCARBAMOL 500 MG PO TABS
1000.0000 mg | ORAL_TABLET | Freq: Once | ORAL | Status: AC
  Administered 2019-06-15: 1000 mg via ORAL
  Filled 2019-06-15: qty 2

## 2019-06-15 MED ORDER — METHOCARBAMOL 500 MG PO TABS: 500.0000 mg | ORAL_TABLET | Freq: Three times a day (TID) | ORAL | 0 refills | Status: DC | PRN

## 2019-06-15 MED ORDER — ACETAMINOPHEN 500 MG PO TABS
1000.0000 mg | ORAL_TABLET | Freq: Once | ORAL | Status: AC
  Administered 2019-06-15: 1000 mg via ORAL
  Filled 2019-06-15: qty 2

## 2019-06-15 MED ORDER — KETOROLAC TROMETHAMINE 15 MG/ML IJ SOLN
15.0000 mg | Freq: Once | INTRAMUSCULAR | Status: AC
  Administered 2019-06-15: 08:00:00 15 mg via INTRAVENOUS
  Filled 2019-06-15: qty 1

## 2019-06-15 NOTE — Discharge Instructions (Addendum)
You were evaluated in the Emergency Department and after careful evaluation, we did not find any emergent condition requiring admission or further testing in the hospital.  Your exam/testing today was overall reassuring.  Your symptoms seem to be due to muscle strain or spasm.  Please take Tylenol during the day for discomfort.  You can also take the Robaxin muscle relaxer provided for more significant pain.  Please return to the Emergency Department if you experience any worsening of your condition.  We encourage you to follow up with a primary care provider.  Thank you for allowing [place] to be a part of your care.

## 2019-06-15 NOTE — ED Triage Notes (Signed)
Patient complains of chest pain that radiates towards back that started x 6 days. States history of COPD.

## 2019-06-15 NOTE — ED Provider Notes (Signed)
[facility] Emergency Department Provider Note MRN:  [id number]  Arrival date & time: 06/15/19     Chief Complaint   Chest Pain   History of Present Illness   Courtney Zuniga is a 69 y.o. year-old female with a history of COPD, lung cancer presenting to the ED with chief complaint of chest pain.  Patient is endorsing left neck pain and shoulder pain that started after she woke up a few days ago, thinks that she slept on it wrong.  The pain has gotten worse gradually over time and now includes the upper left chest and left lateral ribs.  Trouble moving due to the pain.  Denies shortness of breath.  No fever or cough.  No abdominal pain.  No numbness or weakness.  No diaphoresis, no dizziness, no nausea or vomiting.  No leg pain or swelling.  Review of Systems  A complete 10 system review of systems was obtained and all systems are negative except as noted in the HPI and PMH.   Patient's Health History    Past Medical History:  Diagnosis Date  . Anemia    as a young woman  . Arthritis    osteoartritis  . Asthma   . Brain tumor (benign) ([place]) 2005 [facility]   Benign  . Chronic headaches   . Chronic hip pain   . Chronic pain   . COPD (chronic obstructive pulmonary disease) ([place])   . Coronary artery disease   . Depression   . Depression 05/15/2016  . Encounter for antineoplastic chemotherapy 01/10/2016  . GERD (gastroesophageal reflux disease)   . Hypertension   . Lung cancer ([place]) dx'd 01/2016   currently on chemo and radiation   . NSTEMI (non-ST elevated myocardial infarction) ([place]) yrs ago  . On home O2    qhs 2 liters at hs and prn  . Pneumonia last time 2 yrs ago  . Shortness of breath dyspnea    with activity    Past Surgical History:  Procedure Laterality Date  . CHOLECYSTECTOMY    . COLONOSCOPY  2015   [facility] from [facility]  . COLONOSCOPY    . ESOPHAGOGASTRODUODENOSCOPY N/A 08/14/2015   Procedure:  ESOPHAGOGASTRODUODENOSCOPY (EGD);  Surgeon: Daneil Dolin, MD;  [facility];  Service: Endoscopy;  Laterality: N/A;  215   . ESOPHAGOGASTRODUODENOSCOPY (EGD) WITH PROPOFOL N/A 09/13/2015   Procedure: ESOPHAGOGASTRODUODENOSCOPY (EGD) WITH PROPOFOL;  Surgeon: Milus Banister, MD;  [facility];  Service: Endoscopy;  Laterality: N/A;  . EUS N/A 03/12/2017   Procedure: UPPER ENDOSCOPIC ULTRASOUND (EUS) RADIAL;  Surgeon: Milus Banister, MD;  [facility];  Service: Endoscopy;  Laterality: N/A;  . TUMOR REMOVAL  2005   Benign  . UPPER ESOPHAGEAL ENDOSCOPIC ULTRASOUND (EUS)  09/13/2015   Procedure: UPPER ESOPHAGEAL ENDOSCOPIC ULTRASOUND (EUS);  Surgeon: Milus Banister, MD;  [facility];  Service: Endoscopy;;  . VIDEO BRONCHOSCOPY WITH ENDOBRONCHIAL NAVIGATION N/A 12/31/2015   Procedure: VIDEO BRONCHOSCOPY WITH ENDOBRONCHIAL NAVIGATION;  Surgeon: Melrose Nakayama, MD;  Location: [facility];  Service: Thoracic;  Laterality: N/A;  . VIDEO BRONCHOSCOPY WITH ENDOBRONCHIAL ULTRASOUND N/A 11/08/2015   Procedure: VIDEO BRONCHOSCOPY WITH ENDOBRONCHIAL ULTRASOUND;  Surgeon: Ivin Poot, MD;  [facility];  Service: Thoracic;  Laterality: N/A;    Family History  Problem Relation Age of Onset  . Ovarian cancer Mother   . Lung cancer Father   . Lung cancer Brother   . Lung cancer Brother   .  Prostate cancer Brother   . Brain cancer Sister        Half sister    Social History   Socioeconomic History  . Marital status: Single    Spouse name: Not on file  . Number of children: Not on file  . Years of education: 10th grade  . Highest education level: Not on file  Occupational History  . Occupation: retired  Tobacco Use  . Smoking status: Former Smoker    Packs/day: 1.00    Years: 38.00    Pack years: 38.00    Types: Cigarettes    Quit date: 07/26/2018    Years since quitting: 0.8  . Smokeless tobacco: Never Used  . Tobacco comment: Pt has asked doctor  for med to help   Substance and Sexual Activity  . Alcohol use: No    Alcohol/week: 0.0 standard drinks  . Drug use: No  . Sexual activity: Not Currently  Other Topics Concern  . Not on file  Social History Narrative  . Not on file   Social Determinants of Health   Financial Resource Strain:   . Difficulty of Paying Living Expenses: Not on file  Food Insecurity:   . Worried About Charity fundraiser in the Last Year: Not on file  . Ran Out of Food in the Last Year: Not on file  Transportation Needs:   . Lack of Transportation (Medical): Not on file  . Lack of Transportation (Non-Medical): Not on file  Physical Activity:   . Days of Exercise per Week: Not on file  . Minutes of Exercise per Session: Not on file  Stress:   . Feeling of Stress : Not on file  Social Connections:   . Frequency of Communication with Friends and Family: Not on file  . Frequency of Social Gatherings with Friends and Family: Not on file  . Attends Religious Services: Not on file  . Active Member of Clubs or Organizations: Not on file  . Attends Archivist Meetings: Not on file  . Marital Status: Not on file  Intimate Partner Violence:   . Fear of Current or Ex-Partner: Not on file  . Emotionally Abused: Not on file  . Physically Abused: Not on file  . Sexually Abused: Not on file     Physical Exam   Vitals:   06/15/19 0727  Pulse: (!) 111  Resp: (!) 24  Temp: 97.9 F (36.6 C)  SpO2: 94%    CONSTITUTIONAL: Well-appearing, NAD NEURO:  Alert and oriented x 3, no focal deficits EYES:  eyes equal and reactive ENT/NECK:  no LAD, no JVD CARDIO: Tachycardic rate, well-perfused, normal S1 and S2 PULM:  CTAB no wheezing or rhonchi GI/GU:  normal bowel sounds, non-distended, non-tender MSK/SPINE:  No gross deformities, no edema; reproducible pain with palpation to the lateral ribs, range of motion of the left shoulder, palpation to the left trapezius SKIN:  no rash, atraumatic PSYCH:   Appropriate speech and behavior  *Additional and/or pertinent findings included in MDM below  Diagnostic and Interventional Summary    EKG Interpretation  Date/Time:  Wednesday June 15 2019 07:31:10 EST Ventricular Rate:  102 PR Interval:    QRS Duration: 83 QT Interval:  334 QTC Calculation: 435 R Axis:   61 Text Interpretation: Sinus tachycardia Consider right atrial enlargement Anteroseptal infarct, age indeterminate ST elevation, consider inferior injury No significant change was found Confirmed by Gerlene Fee [phone]) on 06/15/2019 8:34:03 AM      Cardiac  Monitoring Interpretation:  Labs Reviewed  CBC - Abnormal; Notable for the following components:      Result Value   RBC 5.18 (*)    All other components within normal limits  BASIC METABOLIC PANEL - Abnormal; Notable for the following components:   Glucose, Bld 110 (*)    All other components within normal limits    DG Chest Port 1 View  Final Result      Medications  ketorolac (TORADOL) 15 MG/ML injection 15 mg (15 mg Intravenous Given 06/15/19 0751)  acetaminophen (TYLENOL) tablet 1,000 mg (1,000 mg Oral Given 06/15/19 0751)  methocarbamol (ROBAXIN) tablet 1,000 mg (1,000 mg Oral Given 06/15/19 0751)     Procedures  /  Critical Care Procedures  ED Course and Medical Decision Making  I have reviewed the triage vital signs, the nursing notes, and pertinent available records from the EMR.  Pertinent labs & imaging results that were available during my care of the patient were reviewed by me and considered in my medical decision making (see below for details).     Favored musculoskeletal sprain or spasm given the exam.  Patient arrives tachycardic but is in obvious pain, will provide pain control and reassess.  She has a number of comorbidities including lung cancer, but there is little to no concern for PE at this time.  8:39 AM update: Patient is feeling much better after medications listed above.  Her work-up is  reassuring, EKG without ischemic changes, normal chest x-ray, normal labs.  History and physical highly suspicious for musculoskeletal etiology, very low concern for cardiac or pulmonary emergency today.  Appropriate for discharge.  Barth Kirks. Sedonia Small, [place] [email or web address]  Final Clinical Impressions(s) / ED Diagnoses     ICD-10-CM   1. Acute pain of left shoulder  M25.512   2. Neck pain  M54.2   3. Rib pain  R07.81     ED Discharge Orders         Ordered    methocarbamol (ROBAXIN) 500 MG tablet  Every 8 hours PRN     06/15/19 0839           Discharge Instructions Discussed with and Provided to Patient:     Discharge Instructions     You were evaluated in the Emergency Department and after careful evaluation, we did not find any emergent condition requiring admission or further testing in the hospital.  Your exam/testing today was overall reassuring.  Your symptoms seem to be due to muscle strain or spasm.  Please take Tylenol during the day for discomfort.  You can also take the Robaxin muscle relaxer provided for more significant pain.  Please return to the Emergency Department if you experience any worsening of your condition.  We encourage you to follow up with a primary care provider.  Thank you for allowing [place] to be a part of your care.        Maudie Flakes, MD 06/15/19 [phone]

## 2019-07-05 ENCOUNTER — Other Ambulatory Visit: Payer: Self-pay

## 2019-07-05 ENCOUNTER — Ambulatory Visit (HOSPITAL_COMMUNITY)
Admission: RE | Admit: 2019-07-05 | Discharge: 2019-07-05 | Disposition: A | Discharge status: Home / Self Care | Payer: Medicare HMO | Source: Ambulatory Visit | Attending: Internal Medicine | Admitting: Internal Medicine

## 2019-07-05 DIAGNOSIS — C3490 Malignant neoplasm of unspecified part of unspecified bronchus or lung: Secondary | ICD-10-CM | POA: Diagnosis present

## 2019-07-05 MED ORDER — SODIUM CHLORIDE (PF) 0.9 % IJ SOLN
INTRAMUSCULAR | Status: AC
  Filled 2019-07-05: qty 50

## 2019-07-05 MED ORDER — IOHEXOL 300 MG/ML  SOLN
100.0000 mL | Freq: Once | INTRAMUSCULAR | Status: AC | PRN
  Administered 2019-07-05: 10:00:00 100 mL via INTRAVENOUS

## 2019-07-07 ENCOUNTER — Inpatient Hospital Stay: Payer: Medicare HMO

## 2019-07-07 ENCOUNTER — Inpatient Hospital Stay: Payer: Medicare HMO | Attending: Oncology | Admitting: Internal Medicine

## 2019-07-08 ENCOUNTER — Telehealth: Payer: Self-pay | Admitting: Medical Oncology

## 2019-07-08 NOTE — Telephone Encounter (Signed)
R/S appt from wed 3/25-she was sick after COVID vaccine #2. Schedule message sent to R/S next week.

## 2019-07-11 NOTE — Progress Notes (Signed)
Pharmacist Chemotherapy Monitoring - Follow Up Assessment    I verify that I have reviewed each item in the below checklist:  . Regimen for the patient is scheduled for the appropriate day and plan matches scheduled date. Marland Kitchen Appropriate non-routine labs are ordered dependent on drug ordered. . If applicable, additional medications reviewed and ordered per protocol based on lifetime cumulative doses and/or treatment regimen.   Plan for follow-up and/or issues identified: No . I-vent associated with next due treatment: No . MD and/or nursing notified: No  Courtney Zuniga D 07/11/2019 8:59 AM

## 2019-07-14 NOTE — Progress Notes (Signed)
[place] OFFICE PROGRESS NOTE  Barry Dienes, NP No address on file  DIAGNOSIS: DIAGNOSIS: Recurrent non-small cell lung cancer initially diagnosed as stage IIIA (T1b, N2, M0) non-small cell lung cancer presented with right middle lobe pulmonary nodule, mediastinal lymphadenopathy and highly suspicious for small nodule in the left upper lobe that could change her stage to stage IV that could present another synchronous primary lesion in the left upper lobe. This was diagnosed in September 2017.  PRIOR THERAPY:  1) Concurrent chemoradiation with weekly carboplatin for AUC of 2 and paclitaxel 45 MG/M2 status post 6 cycles last dose was given 02/25/2016 with partial response. 2) Consolidation chemotherapy with carboplatin for AUC of 5 and paclitaxel 175 MG/M2 every 3 weeks with Neulasta support. First dose 04/22/2016. Status post 3 cycles.  CURRENT THERAPY: Second line treatment with immunotherapy with Nivolumab 480 mg IV every 4 weeks status post 28cycles  INTERVAL HISTORY: Courtney Zuniga 69 y.o. female returns to the clinic for a follow up visit. The patient is feeling fair today without any concerning complaints except for her baseline shortness of breath with exertionand chronic cough secondary to her COPDfor which she is on oxygen via nasal cannula as needed at home and at night. Her prior PCP no longer works at her prior clinic and she is seeing her new PCP later this month. She received her 2nd covid vaccine last week and felt unwell after that for a few days. She reported headaches and fatigue. The patient continues to tolerate treatment withnivolumabwell without any adverse side effects. Denies any fever, chills, night sweats, or weight loss. Denies any hemoptysis. She reports her chronic dry cough. She sometimes feels pain underneath her right rib cage. It resolves spontaneously without any intervention. She denies pain at this time. Denies any nausea, vomiting, diarrhea,  or constipation. Denies any rashes or skin changes. She experiences a chronic headaches. She had a history of a brain tumor in 2005.She recently had her restaging CT scan performed of her chest, abdomen, and pelvis. She is here today for evaluation and to review her scan results before starting cycle #29.   MEDICAL HISTORY: Past Medical History:  Diagnosis Date  . Anemia    as a young woman  . Arthritis    osteoartritis  . Asthma   . Brain tumor (benign) ([place]) 2005 [facility]   Benign  . Chronic headaches   . Chronic hip pain   . Chronic pain   . COPD (chronic obstructive pulmonary disease) ([place])   . Coronary artery disease   . Depression   . Depression 05/15/2016  . Encounter for antineoplastic chemotherapy 01/10/2016  . GERD (gastroesophageal reflux disease)   . Hypertension   . Lung cancer ([place]) dx'd 01/2016   currently on chemo and radiation   . NSTEMI (non-ST elevated myocardial infarction) ([place]) yrs ago  . On home O2    qhs 2 liters at hs and prn  . Pneumonia last time 2 yrs ago  . Shortness of breath dyspnea    with activity    ALLERGIES:  has No Known Allergies.  MEDICATIONS:  Current Outpatient Medications  Medication Sig Dispense Refill  . acetaminophen (TYLENOL) 325 MG tablet Take 2 tablets (650 mg total) by mouth every 6 (six) hours as needed for mild pain, fever or headache. (Patient not taking: Reported on 05/12/2019) 12 tablet 2  . albuterol (PROVENTIL HFA;VENTOLIN HFA) 108 (90 BASE) MCG/ACT inhaler Inhale 2 puffs into the lungs every 4 (four) hours as  needed for shortness of breath.     Marland Kitchen BREO ELLIPTA 100-25 MCG/INH AEPB Inhale 1 puff into the lungs daily.     . Cholecalciferol (VITAMIN D3) 125 MCG (5000 UT) CAPS Take 1 capsule by mouth daily.  0  . cyclobenzaprine (FLEXERIL) 10 MG tablet Take 1 tablet (10 mg total) by mouth 2 (two) times daily. *MAy take one additional tablet as needed for muscle spasms 60 tablet 2  . dextromethorphan-guaiFENesin (MUCINEX DM)  30-600 MG 12hr tablet Take 1 tablet by mouth 2 (two) times daily. 40 tablet 0  . diclofenac sodium (VOLTAREN) 1 % GEL Apply topically 4 (four) times daily as needed (massge gel into affected area(s) as needed for pain).   1  . ENSURE (ENSURE) Take 237 mLs by mouth 3 (three) times daily between meals.    Marland Kitchen ipratropium-albuterol (DUONEB) 0.5-2.5 (3) MG/3ML SOLN Take 3 mLs by nebulization 3 (three) times daily. 360 mL 2  . meloxicam (MOBIC) 15 MG tablet Take 15 mg by mouth daily.    . methocarbamol (ROBAXIN) 500 MG tablet Take 1 tablet (500 mg total) by mouth every 8 (eight) hours as needed for muscle spasms. 30 tablet 0  . metoprolol tartrate (LOPRESSOR) 25 MG tablet Take 1 tablet (25 mg total) by mouth 2 (two) times daily. 60 tablet 1  . nicotine (NICODERM CQ - DOSED IN MG/24 HOURS) 14 mg/24hr patch Place 1 patch (14 mg total) onto the skin daily. 28 patch 0  . oxyCODONE-acetaminophen (PERCOCET/ROXICET) 5-325 MG tablet Take 1 tablet by mouth every 8 (eight) hours as needed for moderate pain. (Patient not taking: Reported on 05/12/2019) 12 tablet 0  . pantoprazole (PROTONIX) 40 MG tablet Take 1 tablet (40 mg total) by mouth daily. 30 tablet 3  . ROBITUSSIN 12 HOUR COUGH 30 MG/5ML liquid Take 30 mg by mouth every 4 (four) hours as needed for cough.     . topiramate (TOPAMAX) 25 MG tablet Take 1 tablet by mouth daily at bedtime X 1 week; then increase to 25mg  BID. 45 tablet 2   No current facility-administered medications for this visit.    SURGICAL HISTORY:  Past Surgical History:  Procedure Laterality Date  . CHOLECYSTECTOMY    . COLONOSCOPY  2015   [facility] requested from [facility]  . COLONOSCOPY    . ESOPHAGOGASTRODUODENOSCOPY N/A 08/14/2015   Procedure: ESOPHAGOGASTRODUODENOSCOPY (EGD);  Surgeon: Daneil Dolin, MD;  [facility];  Service: Endoscopy;  Laterality: N/A;  215   . ESOPHAGOGASTRODUODENOSCOPY (EGD) WITH PROPOFOL N/A 09/13/2015   Procedure:  ESOPHAGOGASTRODUODENOSCOPY (EGD) WITH PROPOFOL;  Surgeon: Milus Banister, MD;  Location: [facility];  Service: Endoscopy;  Laterality: N/A;  . EUS N/A 03/12/2017   Procedure: UPPER ENDOSCOPIC ULTRASOUND (EUS) RADIAL;  Surgeon: Milus Banister, MD;  Location: [facility];  Service: Endoscopy;  Laterality: N/A;  . TUMOR REMOVAL  2005   Benign  . UPPER ESOPHAGEAL ENDOSCOPIC ULTRASOUND (EUS)  09/13/2015   Procedure: UPPER ESOPHAGEAL ENDOSCOPIC ULTRASOUND (EUS);  Surgeon: Milus Banister, MD;  Location: [facility];  Service: Endoscopy;;  . VIDEO BRONCHOSCOPY WITH ENDOBRONCHIAL NAVIGATION N/A 12/31/2015   Procedure: VIDEO BRONCHOSCOPY WITH ENDOBRONCHIAL NAVIGATION;  Surgeon: Melrose Nakayama, MD;  Location: [place];  Service: Thoracic;  Laterality: N/A;  . VIDEO BRONCHOSCOPY WITH ENDOBRONCHIAL ULTRASOUND N/A 11/08/2015   Procedure: VIDEO BRONCHOSCOPY WITH ENDOBRONCHIAL ULTRASOUND;  Surgeon: Ivin Poot, MD;  Location: [place];  Service: Thoracic;  Laterality: N/A;    REVIEW OF SYSTEMS:  Review of Systems  Constitutional: Positive for fatigue from covid vaccine (improved). Negative for appetite change, chills, fever and unexpected weight change.  HENT: Negative for mouth sores, nosebleeds, sore throat and trouble swallowing.   Eyes: Negative for eye problems and icterus.  Respiratory: Positive for shortness of breath with exertionand chronic cough. Negative for hemoptysis and wheezing. Cardiovascular: Positive for occasional right rib cage (none presently). Negative for chest pain and leg swelling.  Gastrointestinal: Negative for abdominal pain, constipation, diarrhea, nausea and vomiting.  Genitourinary: Negative for bladder incontinence, difficulty urinating, dysuria, frequency and hematuria.   Musculoskeletal: Negative for back pain, gait problem, neck pain and neck stiffness.  Skin: Negative for itching and rash.  Neurological: Positive for chronic headaches. Negative for dizziness,  extremity weakness, gait problem, light-headedness and seizures.  Hematological: Negative for adenopathy. Does not bruise/bleed easily.  Psychiatric/Behavioral: Negative for confusion, depression and sleep disturbance. The patient is not nervous/anxious.    PHYSICAL EXAMINATION:  There were no vitals taken for this visit.  ECOG PERFORMANCE STATUS: 1 - Symptomatic but completely ambulatory  Physical Exam  Constitutional: Oriented to person, place, and time and well-developed, well-nourished, and in no distress.  HENT:  Head: Normocephalic and atraumatic.  Mouth/Throat: Oropharynx is clear and moist. No oropharyngeal exudate.  Eyes: Conjunctivae are normal. Right eye exhibits no discharge. Left eye exhibits no discharge. No scleral icterus.  Neck: Normal range of motion. Neck supple.  Cardiovascular: Normal rate, regular rhythm, normal heart sounds and intact distal pulses.   Pulmonary/Chest: Effort normal and breath sounds quiet in all lung fields. No respiratory distress. No wheezes. No rales.  Abdominal: Soft. Bowel sounds are normal. Exhibits no distension and no mass. There is no tenderness.  Musculoskeletal: Normal range of motion. Exhibits no edema.  Lymphadenopathy:    No cervical adenopathy.  Neurological: Left facial twitching and weakness (baseline from brain tumor in 2005). Alert and oriented to person, place, and time. Exhibits normal muscle tone. Gait normal. Coordination normal.  Skin: Skin is warm and dry. No rash noted. Not diaphoretic. No erythema. No pallor.  Psychiatric: Mood, memory and judgment normal.  Vitals reviewed.  LABORATORY DATA: Lab Results  Component Value Date   WBC 7.6 06/15/2019   HGB 15.0 06/15/2019   HCT 44.0 06/15/2019   MCV 84.9 06/15/2019   PLT 246 06/15/2019      Chemistry      Component Value Date/Time   NA 142 06/15/2019 0742   NA 144 04/09/2017 0948   K 3.6 06/15/2019 0742   K 4.3 04/09/2017 0948   CL 103 06/15/2019 0742   CO2 28  06/15/2019 0742   CO2 25 04/09/2017 0948   BUN 18 06/15/2019 0742   BUN 16.8 04/09/2017 0948   CREATININE 0.54 06/15/2019 0742   CREATININE 0.68 06/09/2019 0824   CREATININE 0.8 04/09/2017 0948      Component Value Date/Time   CALCIUM 9.8 06/15/2019 0742   CALCIUM 9.1 04/09/2017 0948   ALKPHOS 278 (H) 06/09/2019 0824   ALKPHOS 216 (H) 04/09/2017 0948   AST 19 06/09/2019 0824   AST 12 04/09/2017 0948   ALT 13 06/09/2019 0824   ALT 13 04/09/2017 0948   BILITOT 0.2 (L) 06/09/2019 0824   BILITOT 0.22 04/09/2017 0948       RADIOGRAPHIC STUDIES:  CT Chest W Contrast  Result Date: 07/05/2019 CLINICAL DATA:  Non-small cell lung cancer. Restaging. EXAM: CT CHEST, ABDOMEN, AND PELVIS WITH CONTRAST TECHNIQUE: Multidetector CT imaging of the chest, abdomen and  pelvis was performed following the standard protocol during bolus administration of intravenous contrast. CONTRAST:  123mL OMNIPAQUE IOHEXOL 300 MG/ML  SOLN COMPARISON:  04/05/2019 FINDINGS: CT CHEST FINDINGS Cardiovascular: The heart size appears within normal limits. No pericardial effusion identified. Aortic atherosclerosis. Three vessel coronary artery calcifications. Mediastinum/Nodes: Normal appearance of the thyroid gland. The trachea appears patent and is midline. Normal appearance of the esophagus. No enlarged mediastinal or hilar lymph nodes. No axillary or supraclavicular adenopathy. Lungs/Pleura: No pleural effusion. Centrilobular and paraseptal emphysema. Paramediastinal radiation change within the right middle lobe is again identified. This measures 6.0 x 3.4 cm, image 91/4. Not significantly changed when compared with previous exam. Previously referenced sub solid right lower lobe lung nodule is not measurable on today's exam. No new suspicious lung lesions. Musculoskeletal: No chest wall mass or suspicious bone lesions identified. CT ABDOMEN PELVIS FINDINGS Hepatobiliary: No focal liver lesion. Status post cholecystectomy.  Unchanged increase caliber of the CBD which measures 1.1 cm, image 53/5. Pancreas: Unremarkable. No pancreatic ductal dilatation or surrounding inflammatory changes. Spleen: Normal in size without focal abnormality. Adrenals/Urinary Tract: Normal appearance of the adrenal glands. No hydronephrosis identified bilaterally. Small bilateral kidney cysts. Urinary bladder is unremarkable. Stomach/Bowel: Stomach is within normal limits. Appendix appears normal. No evidence of bowel wall thickening, distention, or inflammatory changes. Distal colonic diverticulosis noted without acute inflammation. Vascular/Lymphatic: Aortic atherosclerosis. No aneurysm. No abdominopelvic adenopathy. Reproductive: Partially calcified fibroid uterus. No adnexal mass noted. Other: No ascites or fluid collections identified. Musculoskeletal: No acute or significant osseous findings. IMPRESSION: 1. Stable radiation change within the right middle lobe. No findings to suggest local tumor recurrence or metastatic disease. 2. Emphysema and aortic atherosclerosis. 3. Three vessel coronary artery calcifications. Aortic Atherosclerosis (ICD10-I70.0) and Emphysema (ICD10-J43.9). Electronically Signed   By: Kerby Moors M.D.   On: 07/05/2019 12:36   CT Abdomen Pelvis W Contrast  Result Date: 07/05/2019 CLINICAL DATA:  Non-small cell lung cancer. Restaging. EXAM: CT CHEST, ABDOMEN, AND PELVIS WITH CONTRAST TECHNIQUE: Multidetector CT imaging of the chest, abdomen and pelvis was performed following the standard protocol during bolus administration of intravenous contrast. CONTRAST:  135mL OMNIPAQUE IOHEXOL 300 MG/ML  SOLN COMPARISON:  04/05/2019 FINDINGS: CT CHEST FINDINGS Cardiovascular: The heart size appears within normal limits. No pericardial effusion identified. Aortic atherosclerosis. Three vessel coronary artery calcifications. Mediastinum/Nodes: Normal appearance of the thyroid gland. The trachea appears patent and is midline. Normal  appearance of the esophagus. No enlarged mediastinal or hilar lymph nodes. No axillary or supraclavicular adenopathy. Lungs/Pleura: No pleural effusion. Centrilobular and paraseptal emphysema. Paramediastinal radiation change within the right middle lobe is again identified. This measures 6.0 x 3.4 cm, image 91/4. Not significantly changed when compared with previous exam. Previously referenced sub solid right lower lobe lung nodule is not measurable on today's exam. No new suspicious lung lesions. Musculoskeletal: No chest wall mass or suspicious bone lesions identified. CT ABDOMEN PELVIS FINDINGS Hepatobiliary: No focal liver lesion. Status post cholecystectomy. Unchanged increase caliber of the CBD which measures 1.1 cm, image 53/5. Pancreas: Unremarkable. No pancreatic ductal dilatation or surrounding inflammatory changes. Spleen: Normal in size without focal abnormality. Adrenals/Urinary Tract: Normal appearance of the adrenal glands. No hydronephrosis identified bilaterally. Small bilateral kidney cysts. Urinary bladder is unremarkable. Stomach/Bowel: Stomach is within normal limits. Appendix appears normal. No evidence of bowel wall thickening, distention, or inflammatory changes. Distal colonic diverticulosis noted without acute inflammation. Vascular/Lymphatic: Aortic atherosclerosis. No aneurysm. No abdominopelvic adenopathy. Reproductive: Partially calcified fibroid uterus. No adnexal mass noted. Other:  No ascites or fluid collections identified. Musculoskeletal: No acute or significant osseous findings. IMPRESSION: 1. Stable radiation change within the right middle lobe. No findings to suggest local tumor recurrence or metastatic disease. 2. Emphysema and aortic atherosclerosis. 3. Three vessel coronary artery calcifications. Aortic Atherosclerosis (ICD10-I70.0) and Emphysema (ICD10-J43.9). Electronically Signed   By: Kerby Moors M.D.   On: 07/05/2019 12:36   DG Chest Port 1 View  Result Date:  06/15/2019 CLINICAL DATA:  Chest pain EXAM: PORTABLE CHEST 1 VIEW COMPARISON:  06/16/2018 FINDINGS: There is hyperinflation of the lungs compatible with COPD. Scarring in the lung bases. Heart is normal size. Again noted is partial collapse of the right middle lobe, unchanged. No acute confluent opacities or effusions. No acute bony abnormality. IMPRESSION: COPD/chronic changes.  No active disease or change. Electronically Signed   By: Rolm Baptise M.D.   On: 06/15/2019 08:20     ASSESSMENT/PLAN:  This is a very pleasant 69 year old African-American female with recurrent metastatic non-small cell lung cancer, adenocarcinoma of the right middle lobe. She was initially diagnosed as stage IIIa in September 2017.   She completed concurrent chemoradiation followed by consolidation with carboplatin and paclitaxel. She is status post3 cycles. The patient was on observation but a restagingCTscan showed evidence of disease progression.  Sheis currently undergoing treatment withsecond line with immunotherapy with nivolumab 480 mg IV every 4 weeks. She is status post28cycles.   The patient recently had a restaging CT scan performed. Dr. Julien Nordmann personally and independently reviewed the scan and discussed the results with the patient. The scan did not show any evidence of disease progression. Recommend that she proceed with cycle #29 today as scheduled.  We will see her back for a follow up visit in 4 weeks for evaluation before starting cycle #30.  No new findings on CT scan for her increased shortness of breath besides her chronic emphysema and stable radiation changes in her lung. Recommend that she meet with her new primary care provider as planned.    The patient was advised to call immediately if she has any concerning symptoms in the interval. The patient voices understanding of current disease status and treatment options and is in agreement with the current care plan. All questions were  answered. The patient knows to call the clinic with any problems, questions or concerns. We can certainly see the patient much sooner if necessary      No orders of the defined types were placed in this encounter.    Karla Pavone L Virgene Tirone, PA-C 07/14/19  ADDENDUM: Hematology/Oncology Attending: I had a face-to-face encounter with the patient today.  I recommended her care plan.  This is a very pleasant 69 years old African-American female with recurrent non-small cell lung cancer, adenocarcinoma.  She is currently undergoing treatment with immunotherapy with nivolumab 480 mg IV every 4 weeks status post 28 cycles.  The patient was supposed to start cycle #29 last week but she had significant fatigue and weakness as well as headache after the second dose of the Covid vaccine and she could not make it to the office. The patient had repeat CT scan of the chest, abdomen pelvis performed recently.  I personally and independently reviewed the scans and discussed the results with the patient today. Her scan showed no concerning findings for disease progression. I recommended for her to proceed with cycle #29 today as planned. She will come back for follow-up visit in 4 weeks for evaluation before the next cycle of her treatment. The  patient continues to have chronic dyspnea secondary to COPD. She was advised to call immediately if she has any other concerning symptoms in the interval.  Disclaimer: This note was dictated with voice recognition software. Similar sounding words can inadvertently be transcribed and may be missed upon review. Eilleen Kempf, MD 07/15/19

## 2019-07-15 ENCOUNTER — Inpatient Hospital Stay: Payer: Medicare HMO

## 2019-07-15 ENCOUNTER — Inpatient Hospital Stay: Payer: Medicare HMO | Attending: Oncology

## 2019-07-15 ENCOUNTER — Other Ambulatory Visit: Payer: Medicare HMO

## 2019-07-15 ENCOUNTER — Inpatient Hospital Stay (HOSPITAL_BASED_OUTPATIENT_CLINIC_OR_DEPARTMENT_OTHER): Payer: Medicare HMO | Admitting: Physician Assistant

## 2019-07-15 ENCOUNTER — Ambulatory Visit: Payer: Medicare HMO | Admitting: Physician Assistant

## 2019-07-15 ENCOUNTER — Encounter: Payer: Self-pay | Admitting: Physician Assistant

## 2019-07-15 ENCOUNTER — Other Ambulatory Visit: Payer: Self-pay

## 2019-07-15 ENCOUNTER — Ambulatory Visit: Payer: Medicare HMO

## 2019-07-15 VITALS — HR 99

## 2019-07-15 VITALS — BP 134/89 | HR 110 | Temp 98.0°F | Resp 18 | Ht 66.0 in | Wt 137.5 lb

## 2019-07-15 DIAGNOSIS — C3491 Malignant neoplasm of unspecified part of right bronchus or lung: Secondary | ICD-10-CM

## 2019-07-15 DIAGNOSIS — C342 Malignant neoplasm of middle lobe, bronchus or lung: Secondary | ICD-10-CM | POA: Diagnosis present

## 2019-07-15 DIAGNOSIS — Z5112 Encounter for antineoplastic immunotherapy: Secondary | ICD-10-CM | POA: Diagnosis present

## 2019-07-15 DIAGNOSIS — Z79899 Other long term (current) drug therapy: Secondary | ICD-10-CM | POA: Diagnosis not present

## 2019-07-15 DIAGNOSIS — C3490 Malignant neoplasm of unspecified part of unspecified bronchus or lung: Secondary | ICD-10-CM

## 2019-07-15 LAB — CMP (CANCER CENTER ONLY)
ALT: 9 U/L (ref 0–44)
AST: 20 U/L (ref 15–41)
Albumin: 3.7 g/dL (ref 3.5–5.0)
Alkaline Phosphatase: 250 U/L — ABNORMAL HIGH (ref 38–126)
Anion gap: 14 (ref 5–15)
BUN: 12 mg/dL (ref 8–23)
CO2: 28 mmol/L (ref 22–32)
Calcium: 9.5 mg/dL (ref 8.9–10.3)
Chloride: 105 mmol/L (ref 98–111)
Creatinine: 0.75 mg/dL (ref 0.44–1.00)
GFR, Est AFR Am: >60 mL/min (ref >60)
GFR, Estimated: >60 mL/min (ref >60)
Glucose, Bld: 95 mg/dL (ref 70–99)
Potassium: 3.6 mmol/L (ref 3.5–5.1)
Sodium: 147 mmol/L — ABNORMAL HIGH (ref 135–145)
Total Bilirubin: <0.2 mg/dL — ABNORMAL LOW (ref 0.3–1.2)
Total Protein: 7.2 g/dL (ref 6.5–8.1)

## 2019-07-15 LAB — CBC WITH DIFFERENTIAL (CANCER CENTER ONLY)
Abs Immature Granulocytes: 0.02 10*3/uL (ref 0.00–0.07)
Basophils Absolute: 0 10*3/uL (ref 0.0–0.1)
Basophils Relative: 1 %
Eosinophils Absolute: 0.1 10*3/uL (ref 0.0–0.5)
Eosinophils Relative: 2 %
HCT: 40.7 % (ref 36.0–46.0)
Hemoglobin: 13.8 g/dL (ref 12.0–15.0)
Immature Granulocytes: 0 %
Lymphocytes Relative: 26 %
Lymphs Abs: 1.6 10*3/uL (ref 0.7–4.0)
MCH: 28.6 pg (ref 26.0–34.0)
MCHC: 33.9 g/dL (ref 30.0–36.0)
MCV: 84.4 fL (ref 80.0–100.0)
Monocytes Absolute: 0.4 10*3/uL (ref 0.1–1.0)
Monocytes Relative: 6 %
Neutro Abs: 4.1 10*3/uL (ref 1.7–7.7)
Neutrophils Relative %: 65 %
Platelet Count: 247 10*3/uL (ref 150–400)
RBC: 4.82 MIL/uL (ref 3.87–5.11)
RDW: 14.7 % (ref 11.5–15.5)
WBC Count: 6.4 10*3/uL (ref 4.0–10.5)
nRBC: 0 % (ref 0.0–0.2)

## 2019-07-15 LAB — TSH: TSH: 4.101 u[IU]/mL — ABNORMAL HIGH (ref 0.308–3.960)

## 2019-07-15 MED ORDER — SODIUM CHLORIDE 0.9 % IV SOLN
480.0000 mg | Freq: Once | INTRAVENOUS | Status: AC
  Administered 2019-07-15: 480 mg via INTRAVENOUS
  Filled 2019-07-15: qty 48

## 2019-07-15 MED ORDER — SODIUM CHLORIDE 0.9 % IV SOLN
Freq: Once | INTRAVENOUS | Status: AC
  Filled 2019-07-15: qty 250

## 2019-07-15 NOTE — Patient Instructions (Signed)
[facility] Discharge Instructions for Patients Receiving Chemotherapy  Today you received the following Immunotherapy agent: Nivolumab (Opdivo)  To help prevent nausea and vomiting after your treatment, we encourage you to take your nausea medication as directed by your MD.   If you develop nausea and vomiting that is not controlled by your nausea medication, call the clinic.   BELOW ARE SYMPTOMS THAT SHOULD BE REPORTED IMMEDIATELY:  *FEVER GREATER THAN 100.5 F  *CHILLS WITH OR WITHOUT FEVER  NAUSEA AND VOMITING THAT IS NOT CONTROLLED WITH YOUR NAUSEA MEDICATION  *UNUSUAL SHORTNESS OF BREATH  *UNUSUAL BRUISING OR BLEEDING  TENDERNESS IN MOUTH AND THROAT WITH OR WITHOUT PRESENCE OF ULCERS  *URINARY PROBLEMS  *BOWEL PROBLEMS  UNUSUAL RASH Items with * indicate a potential emergency and should be followed up as soon as possible.  Feel free to call the clinic should you have any questions or concerns. The clinic phone number is [phone].  Please show the [facility] at check-in to the Emergency Department and triage nurse.  Coronavirus (COVID-19) Are you at risk?  Are you at risk for the Coronavirus (COVID-19)?  To be considered HIGH RISK for Coronavirus (COVID-19), you have to meet the following criteria:  . Traveled to [place], [place], [place], [place] or [place]; or in the [place] to [place], [place], [place], or [place]; and have fever, cough, and shortness of breath within the last 2 weeks of travel OR . Been in close contact with a person diagnosed with COVID-19 within the last 2 weeks and have fever, cough, and shortness of breath . IF YOU DO NOT MEET THESE CRITERIA, YOU ARE CONSIDERED LOW RISK FOR COVID-19.  What to do if you are HIGH RISK for COVID-19?  Marland Kitchen If you are having a medical emergency, call 911. . Seek medical care right away. Before you go to a doctor's office, urgent care or emergency department, call ahead and tell  them about your recent travel, contact with someone diagnosed with COVID-19, and your symptoms. You should receive instructions from your physician's office regarding next steps of care.  . When you arrive at healthcare provider, tell the healthcare staff immediately you have returned from visiting [place], [place], [place], [place] or [place]; or traveled in the [place] to [place], [place], [place], or [place]; in the last two weeks or you have been in close contact with a person diagnosed with COVID-19 in the last 2 weeks.   . Tell the health care staff about your symptoms: fever, cough and shortness of breath. . After you have been seen by a medical provider, you will be either: o Tested for (COVID-19) and discharged home on quarantine except to seek medical care if symptoms worsen, and asked to  - Stay home and avoid contact with others until you get your results (4-5 days)  - Avoid travel on public transportation if possible (such as bus, train, or airplane) or o Sent to the Emergency Department by EMS for evaluation, COVID-19 testing, and possible admission depending on your condition and test results.  What to do if you are LOW RISK for COVID-19?  Reduce your risk of any infection by using the same precautions used for avoiding the common cold or flu:  Marland Kitchen Wash your hands often with soap and warm water for at least 20 seconds.  If soap and water are not readily available, use an alcohol-based hand sanitizer with at least 60% alcohol.  Marland Kitchen  If coughing or sneezing, cover your mouth and nose by coughing or sneezing into the elbow areas of your shirt or coat, into a tissue or into your sleeve (not your hands). . Avoid shaking hands with others and consider head nods or verbal greetings only. . Avoid touching your eyes, nose, or mouth with unwashed hands.  . Avoid close contact with people who are sick. . Avoid places or events with large numbers of people in one location, like concerts or  sporting events. . Carefully consider travel plans you have or are making. . If you are planning any travel outside or inside the [place], visit the CDC's Travelers' Health webpage for the latest health notices. . If you have some symptoms but not all symptoms, continue to monitor at home and seek medical attention if your symptoms worsen. . If you are having a medical emergency, call 911.   [facility] / e-Visit: [email or web address]         [facility]: [facility]: [phone]                   [facility]: [phone]

## 2019-07-20 ENCOUNTER — Telehealth: Payer: Self-pay | Admitting: Physician Assistant

## 2019-07-20 NOTE — Telephone Encounter (Signed)
Scheduled per los. Called, not able to leave msg. Mailed printout  

## 2019-08-04 ENCOUNTER — Ambulatory Visit: Payer: Medicare HMO

## 2019-08-04 ENCOUNTER — Ambulatory Visit: Payer: Medicare HMO | Admitting: Internal Medicine

## 2019-08-04 ENCOUNTER — Other Ambulatory Visit: Payer: Medicare HMO

## 2019-08-05 NOTE — Progress Notes (Signed)

## 2019-08-10 NOTE — Progress Notes (Signed)
[facility] OFFICE PROGRESS NOTE  Barry Dienes, NP No address on file  DIAGNOSIS: Recurrent non-small cell lung cancer initially diagnosed as stage IIIA (T1b, N2, M0) non-small cell lung cancer presented with right middle lobe pulmonary nodule, mediastinal lymphadenopathy and highly suspicious for small nodule in the left upper lobe that could change her stage to stage IV that could present another synchronous primary lesion in the left upper lobe. This was diagnosed in September 2017.  PRIOR THERAPY: 1) Concurrent chemoradiation with weekly carboplatin for AUC of 2 and paclitaxel 45 MG/M2 status post 6 cycles last dose was given 02/25/2016 with partial response. 2) Consolidation chemotherapy with carboplatin for AUC of 5 and paclitaxel 175 MG/M2 every 3 weeks with Neulasta support. First dose 04/22/2016. Status post 3 cycles  CURRENT THERAPY: Second line treatment with immunotherapy with Nivolumab 480 mg IV every 4 weeks status post 29cycles  INTERVAL HISTORY: Courtney Zuniga 69 y.o. female returns to the clinic for a follow up visit. The patient is feeling fair today without any concerning complaints. The patient continues to tolerate treatment withnivolumabwell without any adversesideeffects. Denies any fever, chills, or night sweats. She reports a few pound unintentional weight loss but states her appetite is good. Denies any hemoptysis. She reports her chronic dry cough and her baseline shortness of breath with exertionsecondary to her COPDfor which she is on oxygen via nasal cannula as needed at home and at night. She sometimes feels pain underneath her right rib cage. She denies pain at this time. She had a sharp pain wrap around from her back to her left upper chest yesterday but it is resolved at this time. Denies any nausea, vomiting, diarrhea, or constipation. Denies any rashes or skin changes.She experiences a chronic headaches.She had a history of a brain tumor in  2005. She is here for evaluation before starting cycle #30.     MEDICAL HISTORY: Past Medical History:  Diagnosis Date  . Anemia    as a young woman  . Arthritis    osteoartritis  . Asthma   . Brain tumor (benign) ([facility]) 2005 [facility]   Benign  . Chronic headaches   . Chronic hip pain   . Chronic pain   . COPD (chronic obstructive pulmonary disease) ([facility])   . Coronary artery disease   . Depression   . Depression 05/15/2016  . Encounter for antineoplastic chemotherapy 01/10/2016  . GERD (gastroesophageal reflux disease)   . Hypertension   . Lung cancer ([facility]) dx'd 01/2016   currently on chemo and radiation   . NSTEMI (non-ST elevated myocardial infarction) ([facility]) yrs ago  . On home O2    qhs 2 liters at hs and prn  . Pneumonia last time 2 yrs ago  . Shortness of breath dyspnea    with activity    ALLERGIES:  has No Known Allergies.  MEDICATIONS:  Current Outpatient Medications  Medication Sig Dispense Refill  . albuterol (PROVENTIL HFA;VENTOLIN HFA) 108 (90 BASE) MCG/ACT inhaler Inhale 2 puffs into the lungs every 4 (four) hours as needed for shortness of breath.     Marland Kitchen BREO ELLIPTA 100-25 MCG/INH AEPB Inhale 1 puff into the lungs daily.     . Cholecalciferol (VITAMIN D3) 125 MCG (5000 UT) CAPS Take 1 capsule by mouth daily.  0  . cyclobenzaprine (FLEXERIL) 10 MG tablet Take 1 tablet (10 mg total) by mouth 2 (two) times daily. *MAy take one additional tablet as needed for muscle spasms 60 tablet 2  .  dextromethorphan-guaiFENesin (MUCINEX DM) 30-600 MG 12hr tablet Take 1 tablet by mouth 2 (two) times daily. 40 tablet 0  . diclofenac sodium (VOLTAREN) 1 % GEL Apply topically 4 (four) times daily as needed (massge gel into affected area(s) as needed for pain).   1  . ENSURE (ENSURE) Take 237 mLs by mouth 3 (three) times daily between meals.    Marland Kitchen ipratropium-albuterol (DUONEB) 0.5-2.5 (3) MG/3ML SOLN Take 3 mLs by nebulization 3 (three) times daily. 360 mL 2  . meloxicam (MOBIC) 15  MG tablet Take 15 mg by mouth daily.    . methocarbamol (ROBAXIN) 500 MG tablet Take 1 tablet (500 mg total) by mouth every 8 (eight) hours as needed for muscle spasms. 30 tablet 0  . metoprolol tartrate (LOPRESSOR) 25 MG tablet Take 1 tablet (25 mg total) by mouth 2 (two) times daily. 60 tablet 1  . nicotine (NICODERM CQ - DOSED IN MG/24 HOURS) 14 mg/24hr patch Place 1 patch (14 mg total) onto the skin daily. 28 patch 0  . pantoprazole (PROTONIX) 40 MG tablet Take 1 tablet (40 mg total) by mouth daily. 30 tablet 3  . ROBITUSSIN 12 HOUR COUGH 30 MG/5ML liquid Take 30 mg by mouth every 4 (four) hours as needed for cough.     . topiramate (TOPAMAX) 25 MG tablet Take 1 tablet by mouth daily at bedtime X 1 week; then increase to 25mg  BID. 45 tablet 2  . acetaminophen (TYLENOL) 325 MG tablet Take 2 tablets (650 mg total) by mouth every 6 (six) hours as needed for mild pain, fever or headache. (Patient not taking: Reported on 05/12/2019) 12 tablet 2  . oxyCODONE-acetaminophen (PERCOCET/ROXICET) 5-325 MG tablet Take 1 tablet by mouth every 8 (eight) hours as needed for moderate pain. (Patient not taking: Reported on 05/12/2019) 12 tablet 0   No current facility-administered medications for this visit.    SURGICAL HISTORY:  Past Surgical History:  Procedure Laterality Date  . CHOLECYSTECTOMY    . COLONOSCOPY  2015   Results requested from [facility]  . COLONOSCOPY    . ESOPHAGOGASTRODUODENOSCOPY N/A 08/14/2015   Procedure: ESOPHAGOGASTRODUODENOSCOPY (EGD);  Surgeon: Daneil Dolin, MD;  [facility];  Service: Endoscopy;  Laterality: N/A;  215   . ESOPHAGOGASTRODUODENOSCOPY (EGD) WITH PROPOFOL N/A 09/13/2015   Procedure: ESOPHAGOGASTRODUODENOSCOPY (EGD) WITH PROPOFOL;  Surgeon: Milus Banister, MD;  Location: [facility] ENDOS[facility];  Service: Endoscopy;  Laterality: N/A;  . EUS N/A 03/12/2017   Procedure: UPPER ENDOSCOPIC ULTRASOUND (EUS) RADIAL;  Surgeon: Milus Banister, MD;  Location: [facility]  ENDOSCOPY;  Service: Endoscopy;  Laterality: N/A;  . TUMOR REMOVAL  2005   Benign  . UPPER ESOPHAGEAL ENDOSCOPIC ULTRASOUND (EUS)  09/13/2015   Procedure: UPPER ESOPHAGEAL ENDOSCOPIC ULTRASOUND (EUS);  Surgeon: Milus Banister, MD;  Location: [facility];  Service: Endoscopy;;  . VIDEO BRONCHOSCOPY WITH ENDOBRONCHIAL NAVIGATION N/A 12/31/2015   Procedure: VIDEO BRONCHOSCOPY WITH ENDOBRONCHIAL NAVIGATION;  Surgeon: Melrose Nakayama, MD;  Location: [facility];  Service: Thoracic;  Laterality: N/A;  . VIDEO BRONCHOSCOPY WITH ENDOBRONCHIAL ULTRASOUND N/A 11/08/2015   Procedure: VIDEO BRONCHOSCOPY WITH ENDOBRONCHIAL ULTRASOUND;  Surgeon: Ivin Poot, MD;  Location: [facility] OR;  Service: Thoracic;  Laterality: N/A;    REVIEW OF SYSTEMS:   Review of Systems  Constitutional: Positive for weight loss. Negative for appetite change, chills, and fever.  HENT: Negative for mouth sores, nosebleeds, sore throat and trouble swallowing.  Eyes: Negative for eye problems and icterus.  Respiratory:Positive for  shortness of breath with exertionand chronic cough.Negative for hemoptysis and wheezing. Cardiovascular: Positive for occasional rib cage pain (none presently). Negative for chest pain at this time and leg swelling.  Gastrointestinal: Negative for abdominal pain, constipation, diarrhea, nausea and vomiting.  Genitourinary: Negative for bladder incontinence, difficulty urinating, dysuria, frequency and hematuria.  Musculoskeletal: Positive for back pain that radiates to the anterior rib cage (resolved at this time). Negative for  gait problem, neck pain and neck stiffness.  Skin: Negative for itching and rash.  Neurological:Positive for chronic headaches.Negative for dizziness, extremity weakness, gait problem, light-headedness and seizures.  Hematological: Negative for adenopathy. Does not bruise/bleed easily.  Psychiatric/Behavioral: Negative for confusion, depression and sleep disturbance. The patient  is not nervous/anxious.     PHYSICAL EXAMINATION:  Blood pressure 126/90, pulse 97, temperature 99.1 F (37.3 C), temperature source Oral, resp. rate 19, height 5\' 6"  (1.676 m), weight 134 lb 11.2 oz (61.1 kg), SpO2 90 %.  ECOG PERFORMANCE STATUS: 1 - Symptomatic but completely ambulatory  Physical Exam  Constitutional: Oriented to person, place, and time and well-developed, well-nourished, and in no distress.  HENT:  Head: Normocephalic and atraumatic.  Mouth/Throat: Oropharynx is clear and moist. No oropharyngeal exudate.  Eyes: Conjunctivae are normal. Right eye exhibits no discharge. Left eye exhibits no discharge. No scleral icterus.  Neck: Normal range of motion. Neck supple.  Cardiovascular: Normal rate, regular rhythm, normal heart sounds and intact distal pulses.   Pulmonary/Chest: Effort normal and breath sounds quiet in all lung fields. No respiratory distress. No wheezes. No rales.  Abdominal: Soft. Bowel sounds are normal. Exhibits no distension and no mass. There is no tenderness.  Musculoskeletal: Normal range of motion. Exhibits no edema.  Lymphadenopathy:    No cervical adenopathy.  Neurological: Left facial twitching and weakness (baseline from brain tumor in 2005).Alert and oriented to person, place, and time. Exhibits normal muscle tone. Gait normal. Coordination normal.  Skin: Skin is warm and dry. No rash noted. Not diaphoretic. No erythema. No pallor.  Psychiatric: Mood, memory and judgment normal.  Vitals reviewed.  LABORATORY DATA: Lab Results  Component Value Date   WBC 5.4 08/11/2019   HGB 13.9 08/11/2019   HCT 40.9 08/11/2019   MCV 85.2 08/11/2019   PLT 213 08/11/2019      Chemistry      Component Value Date/Time   NA 147 (H) 07/15/2019 0749   NA 144 04/09/2017 0948   K 3.6 07/15/2019 0749   K 4.3 04/09/2017 0948   CL 105 07/15/2019 0749   CO2 28 07/15/2019 0749   CO2 25 04/09/2017 0948   BUN 12 07/15/2019 0749   BUN 16.8 04/09/2017 0948    CREATININE 0.75 07/15/2019 0749   CREATININE 0.8 04/09/2017 0948      Component Value Date/Time   CALCIUM 9.5 07/15/2019 0749   CALCIUM 9.1 04/09/2017 0948   ALKPHOS 250 (H) 07/15/2019 0749   ALKPHOS 216 (H) 04/09/2017 0948   AST 20 07/15/2019 0749   AST 12 04/09/2017 0948   ALT 9 07/15/2019 0749   ALT 13 04/09/2017 0948   BILITOT <0.2 (L) 07/15/2019 0749   BILITOT 0.22 04/09/2017 0948       RADIOGRAPHIC STUDIES:  No results found.   ASSESSMENT/PLAN:  This is a very pleasant 69 year old African-American female with recurrent metastatic non-small cell lung cancer, adenocarcinoma of the right middle lobe. She was initially diagnosed as stage IIIa in September 2017.   She completed concurrent chemoradiation followed by consolidation with carboplatin  and paclitaxel. She is status post3 cycles. The patient was on observation but a restagingCTscan showed evidence of disease progression.  Sheis currently undergoing treatment withsecond line with immunotherapy with nivolumab 480 mg IV every 4 weeks. She is status post29cycles.  Labs were reviewed. Recommend that she proceed with cycle #30 today as scheduled.   We will see her back for a follow up visit in 4 weeks for evaluation before starting cycle #31.  The patient was advised to call immediately if she has any concerning symptoms in the interval. The patient voices understanding of current disease status and treatment options and is in agreement with the current care plan. All questions were answered. The patient knows to call the clinic with any problems, questions or concerns. We can certainly see the patient much sooner if necessary   No orders of the defined types were placed in this encounter.    Iasha Mccalister L Everado Pillsbury, PA-C 08/11/19

## 2019-08-11 ENCOUNTER — Inpatient Hospital Stay: Payer: Medicare HMO

## 2019-08-11 ENCOUNTER — Inpatient Hospital Stay (HOSPITAL_BASED_OUTPATIENT_CLINIC_OR_DEPARTMENT_OTHER): Payer: Medicare HMO | Admitting: Physician Assistant

## 2019-08-11 ENCOUNTER — Other Ambulatory Visit: Payer: Self-pay

## 2019-08-11 VITALS — BP 126/90 | HR 97 | Temp 99.1°F | Resp 19 | Ht 66.0 in | Wt 134.7 lb

## 2019-08-11 DIAGNOSIS — C3490 Malignant neoplasm of unspecified part of unspecified bronchus or lung: Secondary | ICD-10-CM

## 2019-08-11 DIAGNOSIS — Z5112 Encounter for antineoplastic immunotherapy: Secondary | ICD-10-CM

## 2019-08-11 DIAGNOSIS — C3491 Malignant neoplasm of unspecified part of right bronchus or lung: Secondary | ICD-10-CM | POA: Diagnosis not present

## 2019-08-11 LAB — CBC WITH DIFFERENTIAL (CANCER CENTER ONLY)
Abs Immature Granulocytes: 0.02 10*3/uL (ref 0.00–0.07)
Basophils Absolute: 0 10*3/uL (ref 0.0–0.1)
Basophils Relative: 0 %
Eosinophils Absolute: 0.1 10*3/uL (ref 0.0–0.5)
Eosinophils Relative: 1 %
HCT: 40.9 % (ref 36.0–46.0)
Hemoglobin: 13.9 g/dL (ref 12.0–15.0)
Immature Granulocytes: 0 %
Lymphocytes Relative: 37 %
Lymphs Abs: 2 10*3/uL (ref 0.7–4.0)
MCH: 29 pg (ref 26.0–34.0)
MCHC: 34 g/dL (ref 30.0–36.0)
MCV: 85.2 fL (ref 80.0–100.0)
Monocytes Absolute: 0.4 10*3/uL (ref 0.1–1.0)
Monocytes Relative: 8 %
Neutro Abs: 2.8 10*3/uL (ref 1.7–7.7)
Neutrophils Relative %: 54 %
Platelet Count: 213 10*3/uL (ref 150–400)
RBC: 4.8 MIL/uL (ref 3.87–5.11)
RDW: 15.3 % (ref 11.5–15.5)
WBC Count: 5.4 10*3/uL (ref 4.0–10.5)
nRBC: 0 % (ref 0.0–0.2)

## 2019-08-11 LAB — CMP (CANCER CENTER ONLY)
ALT: 10 U/L (ref 0–44)
AST: 21 U/L (ref 15–41)
Albumin: 3.8 g/dL (ref 3.5–5.0)
Alkaline Phosphatase: 275 U/L — ABNORMAL HIGH (ref 38–126)
Anion gap: 10 (ref 5–15)
BUN: 16 mg/dL (ref 8–23)
CO2: 28 mmol/L (ref 22–32)
Calcium: 9.3 mg/dL (ref 8.9–10.3)
Chloride: 105 mmol/L (ref 98–111)
Creatinine: 0.71 mg/dL (ref 0.44–1.00)
GFR, Est AFR Am: >60 mL/min (ref >60)
GFR, Estimated: >60 mL/min (ref >60)
Glucose, Bld: 78 mg/dL (ref 70–99)
Potassium: 4.1 mmol/L (ref 3.5–5.1)
Sodium: 143 mmol/L (ref 135–145)
Total Bilirubin: 0.3 mg/dL (ref 0.3–1.2)
Total Protein: 7 g/dL (ref 6.5–8.1)

## 2019-08-11 LAB — TSH: TSH: 4.38 u[IU]/mL — ABNORMAL HIGH (ref 0.308–3.960)

## 2019-08-11 MED ORDER — SODIUM CHLORIDE 0.9 % IV SOLN
480.0000 mg | Freq: Once | INTRAVENOUS | Status: AC
  Administered 2019-08-11: 480 mg via INTRAVENOUS
  Filled 2019-08-11: qty 48

## 2019-08-11 MED ORDER — SODIUM CHLORIDE 0.9 % IV SOLN
Freq: Once | INTRAVENOUS | Status: AC
  Filled 2019-08-11: qty 250

## 2019-08-11 NOTE — Patient Instructions (Signed)
Cancer Center Discharge Instructions for Patients Receiving Chemotherapy  Today you received the following chemotherapy agents Opdivo  To help prevent nausea and vomiting after your treatment, we encourage you to take your nausea medication as directed   If you develop nausea and vomiting that is not controlled by your nausea medication, call the clinic.   BELOW ARE SYMPTOMS THAT SHOULD BE REPORTED IMMEDIATELY:  *FEVER GREATER THAN 100.5 F  *CHILLS WITH OR WITHOUT FEVER  NAUSEA AND VOMITING THAT IS NOT CONTROLLED WITH YOUR NAUSEA MEDICATION  *UNUSUAL SHORTNESS OF BREATH  *UNUSUAL BRUISING OR BLEEDING  TENDERNESS IN MOUTH AND THROAT WITH OR WITHOUT PRESENCE OF ULCERS  *URINARY PROBLEMS  *BOWEL PROBLEMS  UNUSUAL RASH Items with * indicate a potential emergency and should be followed up as soon as possible.  Feel free to call the clinic should you have any questions or concerns. The clinic phone number is [phone].  Please show the CHEMO ALERT CARD at check-in to the Emergency Department and triage nurse.   

## 2019-09-02 NOTE — Progress Notes (Signed)
Pharmacist Chemotherapy Monitoring - Follow Up Assessment    I verify that I have reviewed each item in the below checklist:  . Regimen for the patient is scheduled for the appropriate day and plan matches scheduled date. Marland Kitchen Appropriate non-routine labs are ordered dependent on drug ordered. . If applicable, additional medications reviewed and ordered per protocol based on lifetime cumulative doses and/or treatment regimen.   Plan for follow-up and/or issues identified: Yes . I-vent associated with next due treatment: Yes . MD and/or nursing notified: No  Britt Boozer 09/02/2019 9:58 AM

## 2019-09-08 ENCOUNTER — Inpatient Hospital Stay: Payer: Medicare HMO

## 2019-09-08 ENCOUNTER — Inpatient Hospital Stay: Payer: Medicare HMO | Attending: Oncology | Admitting: Internal Medicine

## 2019-09-08 ENCOUNTER — Encounter: Payer: Self-pay | Admitting: Internal Medicine

## 2019-09-08 ENCOUNTER — Other Ambulatory Visit: Payer: Self-pay

## 2019-09-08 VITALS — BP 133/81 | HR 102 | Temp 98.1°F | Resp 20 | Ht 66.0 in | Wt 138.4 lb

## 2019-09-08 DIAGNOSIS — C3491 Malignant neoplasm of unspecified part of right bronchus or lung: Secondary | ICD-10-CM

## 2019-09-08 DIAGNOSIS — Z79899 Other long term (current) drug therapy: Secondary | ICD-10-CM | POA: Insufficient documentation

## 2019-09-08 DIAGNOSIS — Z923 Personal history of irradiation: Secondary | ICD-10-CM | POA: Diagnosis not present

## 2019-09-08 DIAGNOSIS — C3490 Malignant neoplasm of unspecified part of unspecified bronchus or lung: Secondary | ICD-10-CM

## 2019-09-08 DIAGNOSIS — C342 Malignant neoplasm of middle lobe, bronchus or lung: Secondary | ICD-10-CM | POA: Diagnosis not present

## 2019-09-08 DIAGNOSIS — Z9221 Personal history of antineoplastic chemotherapy: Secondary | ICD-10-CM

## 2019-09-08 DIAGNOSIS — Z5112 Encounter for antineoplastic immunotherapy: Secondary | ICD-10-CM | POA: Insufficient documentation

## 2019-09-08 DIAGNOSIS — I10 Essential (primary) hypertension: Secondary | ICD-10-CM

## 2019-09-08 LAB — CMP (CANCER CENTER ONLY)
ALT: 9 U/L (ref 0–44)
AST: 19 U/L (ref 15–41)
Albumin: 3.5 g/dL (ref 3.5–5.0)
Alkaline Phosphatase: 272 U/L — ABNORMAL HIGH (ref 38–126)
Anion gap: 11 (ref 5–15)
BUN: 18 mg/dL (ref 8–23)
CO2: 30 mmol/L (ref 22–32)
Calcium: 9.3 mg/dL (ref 8.9–10.3)
Chloride: 103 mmol/L (ref 98–111)
Creatinine: 0.7 mg/dL (ref 0.44–1.00)
GFR, Est AFR Am: >60 mL/min (ref >60)
GFR, Estimated: >60 mL/min (ref >60)
Glucose, Bld: 71 mg/dL (ref 70–99)
Potassium: 4.1 mmol/L (ref 3.5–5.1)
Sodium: 144 mmol/L (ref 135–145)
Total Bilirubin: <0.2 mg/dL — ABNORMAL LOW (ref 0.3–1.2)
Total Protein: 6.9 g/dL (ref 6.5–8.1)

## 2019-09-08 LAB — CBC WITH DIFFERENTIAL (CANCER CENTER ONLY)
Abs Immature Granulocytes: 0.02 10*3/uL (ref 0.00–0.07)
Basophils Absolute: 0 10*3/uL (ref 0.0–0.1)
Basophils Relative: 1 %
Eosinophils Absolute: 0.1 10*3/uL (ref 0.0–0.5)
Eosinophils Relative: 2 %
HCT: 40.8 % (ref 36.0–46.0)
Hemoglobin: 13.8 g/dL (ref 12.0–15.0)
Immature Granulocytes: 0 %
Lymphocytes Relative: 28 %
Lymphs Abs: 1.7 10*3/uL (ref 0.7–4.0)
MCH: 29 pg (ref 26.0–34.0)
MCHC: 33.8 g/dL (ref 30.0–36.0)
MCV: 85.7 fL (ref 80.0–100.0)
Monocytes Absolute: 0.5 10*3/uL (ref 0.1–1.0)
Monocytes Relative: 9 %
Neutro Abs: 3.6 10*3/uL (ref 1.7–7.7)
Neutrophils Relative %: 60 %
Platelet Count: 221 10*3/uL (ref 150–400)
RBC: 4.76 MIL/uL (ref 3.87–5.11)
RDW: 14.9 % (ref 11.5–15.5)
WBC Count: 6 10*3/uL (ref 4.0–10.5)
nRBC: 0 % (ref 0.0–0.2)

## 2019-09-08 LAB — TSH: TSH: 3.215 u[IU]/mL (ref 0.308–3.960)

## 2019-09-08 MED ORDER — SODIUM CHLORIDE 0.9 % IV SOLN
480.0000 mg | Freq: Once | INTRAVENOUS | Status: AC
  Administered 2019-09-08: 480 mg via INTRAVENOUS
  Filled 2019-09-08: qty 48

## 2019-09-08 MED ORDER — SODIUM CHLORIDE 0.9 % IV SOLN
Freq: Once | INTRAVENOUS | Status: AC
  Filled 2019-09-08: qty 250

## 2019-09-08 NOTE — Progress Notes (Signed)
Per Dr Julien Nordmann ,it is okay to treat pt today with Nivolumab and pulse of 102.

## 2019-09-08 NOTE — Progress Notes (Signed)
[place] [phone]   [phone]  OFFICE PROGRESS NOTE  Barry Dienes, NP No address on file  DIAGNOSIS: Recurrent non-small cell lung cancer initially diagnosed as stage IIIA (T1b, N2, M0) non-small cell lung cancer presented with right middle lobe pulmonary nodule, mediastinal lymphadenopathy and highly suspicious for small nodule in the left upper lobe that could change her stage to stage IV that could present another synchronous primary lesion in the left upper lobe. This was diagnosed in September 2017.  PRIOR THERAPY:  1) Concurrent chemoradiation with weekly carboplatin for AUC of 2 and paclitaxel 45 MG/M2 status post 6 cycles last dose was given 02/25/2016 with partial response. 2) Consolidation chemotherapy with carboplatin for AUC of 5 and paclitaxel 175 MG/M2 every 3 weeks with Neulasta support. First dose 04/22/2016. Status post 3 cycles.  CURRENT THERAPY: Second line treatment with immunotherapy with Nivolumab 480 mg IV every 4 weeks status post 30 cycles..  INTERVAL HISTORY: Courtney Zuniga 69 y.o. female returns to the clinic today for follow-up visit.  The patient is feeling fine today with no concerning complaints except for the intermittent frontal headache as well as pain on the left arm.  She is currently on oxycodone by her primary care physician but she does not take it at regular basis especially when she is leaving her house.  She denied having any chest pain, shortness of breath, cough or hemoptysis.  She denied having any fever or chills.  She has no nausea, vomiting, diarrhea or constipation.  She is here today for evaluation before starting cycle #30 one of her treatment.   MEDICAL HISTORY: Past Medical History:  Diagnosis Date  . Anemia    as a young woman  . Arthritis    osteoartritis  . Asthma   . Brain tumor (benign) ([place]) 2005 [facility]   Benign  . Chronic headaches   . Chronic hip pain   . Chronic pain   .  COPD (chronic obstructive pulmonary disease) ([place])   . Coronary artery disease   . Depression   . Depression 05/15/2016  . Encounter for antineoplastic chemotherapy 01/10/2016  . GERD (gastroesophageal reflux disease)   . Hypertension   . Lung cancer ([place]) dx'd 01/2016   currently on chemo and radiation   . NSTEMI (non-ST elevated myocardial infarction) ([place]) yrs ago  . On home O2    qhs 2 liters at hs and prn  . Pneumonia last time 2 yrs ago  . Shortness of breath dyspnea    with activity    ALLERGIES:  has No Known Allergies.  MEDICATIONS:  Current Outpatient Medications  Medication Sig Dispense Refill  . acetaminophen (TYLENOL) 325 MG tablet Take 2 tablets (650 mg total) by mouth every 6 (six) hours as needed for mild pain, fever or headache. (Patient not taking: Reported on 05/12/2019) 12 tablet 2  . albuterol (PROVENTIL HFA;VENTOLIN HFA) 108 (90 BASE) MCG/ACT inhaler Inhale 2 puffs into the lungs every 4 (four) hours as needed for shortness of breath.     Marland Kitchen BREO ELLIPTA 100-25 MCG/INH AEPB Inhale 1 puff into the lungs daily.     . Cholecalciferol (VITAMIN D3) 125 MCG (5000 UT) CAPS Take 1 capsule by mouth daily.  0  . cyclobenzaprine (FLEXERIL) 10 MG tablet Take 1 tablet (10 mg total) by mouth 2 (two) times daily. *MAy take one additional tablet as needed for muscle spasms 60 tablet 2  . dextromethorphan-guaiFENesin (MUCINEX DM) 30-600 MG 12hr  tablet Take 1 tablet by mouth 2 (two) times daily. 40 tablet 0  . diclofenac sodium (VOLTAREN) 1 % GEL Apply topically 4 (four) times daily as needed (massge gel into affected area(s) as needed for pain).   1  . ENSURE (ENSURE) Take 237 mLs by mouth 3 (three) times daily between meals.    Marland Kitchen ipratropium-albuterol (DUONEB) 0.5-2.5 (3) MG/3ML SOLN Take 3 mLs by nebulization 3 (three) times daily. 360 mL 2  . meloxicam (MOBIC) 15 MG tablet Take 15 mg by mouth daily.    . methocarbamol (ROBAXIN) 500 MG tablet Take 1 tablet (500 mg total) by mouth  every 8 (eight) hours as needed for muscle spasms. 30 tablet 0  . metoprolol tartrate (LOPRESSOR) 25 MG tablet Take 1 tablet (25 mg total) by mouth 2 (two) times daily. 60 tablet 1  . nicotine (NICODERM CQ - DOSED IN MG/24 HOURS) 14 mg/24hr patch Place 1 patch (14 mg total) onto the skin daily. 28 patch 0  . oxyCODONE-acetaminophen (PERCOCET/ROXICET) 5-325 MG tablet Take 1 tablet by mouth every 8 (eight) hours as needed for moderate pain. (Patient not taking: Reported on 05/12/2019) 12 tablet 0  . pantoprazole (PROTONIX) 40 MG tablet Take 1 tablet (40 mg total) by mouth daily. 30 tablet 3  . ROBITUSSIN 12 HOUR COUGH 30 MG/5ML liquid Take 30 mg by mouth every 4 (four) hours as needed for cough.     . topiramate (TOPAMAX) 25 MG tablet Take 1 tablet by mouth daily at bedtime X 1 week; then increase to 25mg  BID. 45 tablet 2   No current facility-administered medications for this visit.    SURGICAL HISTORY:  Past Surgical History:  Procedure Laterality Date  . CHOLECYSTECTOMY    . COLONOSCOPY  2015   Results requested from [facility]  . COLONOSCOPY    . ESOPHAGOGASTRODUODENOSCOPY N/A 08/14/2015   Procedure: ESOPHAGOGASTRODUODENOSCOPY (EGD);  Surgeon: Daneil Dolin, MD;  [facility];  Service: Endoscopy;  Laterality: N/A;  215   . ESOPHAGOGASTRODUODENOSCOPY (EGD) WITH PROPOFOL N/A 09/13/2015   Procedure: ESOPHAGOGASTRODUODENOSCOPY (EGD) WITH PROPOFOL;  Surgeon: Milus Banister, MD;  [facility];  Service: Endoscopy;  Laterality: N/A;  . EUS N/A 03/12/2017   Procedure: UPPER ENDOSCOPIC ULTRASOUND (EUS) RADIAL;  Surgeon: Milus Banister, MD;  [facility];  Service: Endoscopy;  Laterality: N/A;  . TUMOR REMOVAL  2005   Benign  . UPPER ESOPHAGEAL ENDOSCOPIC ULTRASOUND (EUS)  09/13/2015   Procedure: UPPER ESOPHAGEAL ENDOSCOPIC ULTRASOUND (EUS);  Surgeon: Milus Banister, MD;  Location: [facility];  Service: Endoscopy;;  . VIDEO BRONCHOSCOPY WITH  ENDOBRONCHIAL NAVIGATION N/A 12/31/2015   Procedure: VIDEO BRONCHOSCOPY WITH ENDOBRONCHIAL NAVIGATION;  Surgeon: Melrose Nakayama, MD;  Location: [place];  Service: Thoracic;  Laterality: N/A;  . VIDEO BRONCHOSCOPY WITH ENDOBRONCHIAL ULTRASOUND N/A 11/08/2015   Procedure: VIDEO BRONCHOSCOPY WITH ENDOBRONCHIAL ULTRASOUND;  Surgeon: Ivin Poot, MD;  [facility];  Service: Thoracic;  Laterality: N/A;    REVIEW OF SYSTEMS:  A comprehensive review of systems was negative except for: Musculoskeletal: positive for arthralgias Neurological: positive for headaches   PHYSICAL EXAMINATION: General appearance: alert, cooperative, fatigued and no distress Head: Normocephalic, without obvious abnormality, atraumatic Neck: no adenopathy, no JVD, supple, symmetrical, trachea midline and thyroid not enlarged, symmetric, no tenderness/mass/nodules Lymph nodes: Cervical, supraclavicular, and axillary nodes normal. Resp: clear to auscultation bilaterally Back: symmetric, no curvature. ROM normal. No CVA tenderness. Cardio: regular rate and rhythm, S1, S2 normal, no  murmur, click, rub or gallop GI: soft, non-tender; bowel sounds normal; no masses,  no organomegaly Extremities: extremities normal, atraumatic, no cyanosis or edema  ECOG PERFORMANCE STATUS: 1 - Symptomatic but completely ambulatory  Blood pressure 133/81, pulse (!) 102, temperature 98.1 F (36.7 C), temperature source Temporal, resp. rate 20, height 5\' 6"  (1.676 m), weight 138 lb 6.4 oz (62.8 kg), SpO2 93 %.  LABORATORY DATA: Lab Results  Component Value Date   WBC 6.0 09/08/2019   HGB 13.8 09/08/2019   HCT 40.8 09/08/2019   MCV 85.7 09/08/2019   PLT 221 09/08/2019      Chemistry      Component Value Date/Time   NA 143 08/11/2019 0820   NA 144 04/09/2017 0948   K 4.1 08/11/2019 0820   K 4.3 04/09/2017 0948   CL 105 08/11/2019 0820   CO2 28 08/11/2019 0820   CO2 25 04/09/2017 0948   BUN 16 08/11/2019 0820   BUN 16.8  04/09/2017 0948   CREATININE 0.71 08/11/2019 0820   CREATININE 0.8 04/09/2017 0948      Component Value Date/Time   CALCIUM 9.3 08/11/2019 0820   CALCIUM 9.1 04/09/2017 0948   ALKPHOS 275 (H) 08/11/2019 0820   ALKPHOS 216 (H) 04/09/2017 0948   AST 21 08/11/2019 0820   AST 12 04/09/2017 0948   ALT 10 08/11/2019 0820   ALT 13 04/09/2017 0948   BILITOT 0.3 08/11/2019 0820   BILITOT 0.22 04/09/2017 0948       RADIOGRAPHIC STUDIES:-   No results found.  ASSESSMENT AND PLAN:  This is a very pleasant 69 years old African-American female with a recurrent non-small cell lung cancer initially diagnosed as stage IIIA non-small cell lung cancer status post concurrent chemoradiation followed by consolidation chemotherapy with carboplatin and paclitaxel for 3 cycles. The patient was followed by observation but restaging imaging studies showed evidence for disease progression. She was started on second line treatment with immunotherapy with Nivolumab 480 mg IV every 4 weeks status post 30 cycles.   The patient continues to tolerate this treatment well with no concerning adverse effects. I recommended for her to proceed with cycle #31 today as planned. She will come back for follow-up visit in 4 weeks for evaluation with repeat CT scan of the chest, abdomen pelvis for restaging of her disease. The patient was advised to call immediately if she has any concerning symptoms in the interval. The patient voices understanding of current disease status and treatment options and is in agreement with the current care plan. All questions were answered. The patient knows to call the clinic with any problems, questions or concerns. We can certainly see the patient much sooner if necessary.  Disclaimer: This note was dictated with voice recognition software. Similar sounding words can inadvertently be transcribed and may not be corrected upon review.

## 2019-09-19 ENCOUNTER — Other Ambulatory Visit (HOSPITAL_COMMUNITY): Payer: Self-pay | Admitting: Internal Medicine

## 2019-09-19 DIAGNOSIS — Z1231 Encounter for screening mammogram for malignant neoplasm of breast: Secondary | ICD-10-CM

## 2019-09-27 ENCOUNTER — Telehealth: Payer: Self-pay | Admitting: Medical Oncology

## 2019-09-27 NOTE — Telephone Encounter (Addendum)
Insurance issue with CT scan. Pt confirmed appt for next week. Per Mohamned pt needs to keep appt even if she did not get CT scan.

## 2019-09-29 ENCOUNTER — Ambulatory Visit (HOSPITAL_COMMUNITY): Payer: Medicare HMO

## 2019-09-30 NOTE — Progress Notes (Signed)
Pharmacist Chemotherapy Monitoring - Follow Up Assessment    I verify that I have reviewed each item in the below checklist:  . Regimen for the patient is scheduled for the appropriate day and plan matches scheduled date. Marland Kitchen Appropriate non-routine labs are ordered dependent on drug ordered. . If applicable, additional medications reviewed and ordered per protocol based on lifetime cumulative doses and/or treatment regimen.   Plan for follow-up and/or issues identified: Yes . I-vent associated with next due treatment: Yes . MD and/or nursing notified: Yes   Kennith Center, Pharm.D., CPP 09/30/2019@2 :03 PM

## 2019-10-03 ENCOUNTER — Emergency Department (HOSPITAL_COMMUNITY): Payer: Medicare HMO

## 2019-10-03 ENCOUNTER — Other Ambulatory Visit: Payer: Self-pay

## 2019-10-03 ENCOUNTER — Emergency Department (HOSPITAL_COMMUNITY)
Admission: EM | Admit: 2019-10-03 | Discharge: 2019-10-04 | Disposition: A | Discharge status: Home / Self Care | Payer: Medicare HMO | Attending: Emergency Medicine | Admitting: Emergency Medicine

## 2019-10-03 ENCOUNTER — Encounter (HOSPITAL_COMMUNITY): Payer: Self-pay | Admitting: Emergency Medicine

## 2019-10-03 DIAGNOSIS — J441 Chronic obstructive pulmonary disease with (acute) exacerbation: Secondary | ICD-10-CM | POA: Diagnosis not present

## 2019-10-03 DIAGNOSIS — I119 Hypertensive heart disease without heart failure: Secondary | ICD-10-CM | POA: Insufficient documentation

## 2019-10-03 DIAGNOSIS — I2510 Atherosclerotic heart disease of native coronary artery without angina pectoris: Secondary | ICD-10-CM | POA: Diagnosis not present

## 2019-10-03 DIAGNOSIS — Z20822 Contact with and (suspected) exposure to covid-19: Secondary | ICD-10-CM | POA: Insufficient documentation

## 2019-10-03 DIAGNOSIS — Z79899 Other long term (current) drug therapy: Secondary | ICD-10-CM | POA: Diagnosis not present

## 2019-10-03 DIAGNOSIS — Z87891 Personal history of nicotine dependence: Secondary | ICD-10-CM | POA: Diagnosis not present

## 2019-10-03 DIAGNOSIS — J9621 Acute and chronic respiratory failure with hypoxia: Secondary | ICD-10-CM | POA: Insufficient documentation

## 2019-10-03 DIAGNOSIS — R0602 Shortness of breath: Secondary | ICD-10-CM | POA: Diagnosis present

## 2019-10-03 DIAGNOSIS — J45909 Unspecified asthma, uncomplicated: Secondary | ICD-10-CM | POA: Diagnosis not present

## 2019-10-03 LAB — BASIC METABOLIC PANEL
Anion gap: 14 (ref 5–15)
BUN: 17 mg/dL (ref 8–23)
CO2: 29 mmol/L (ref 22–32)
Calcium: 10 mg/dL (ref 8.9–10.3)
Chloride: 100 mmol/L (ref 98–111)
Creatinine, Ser: 0.59 mg/dL (ref 0.44–1.00)
GFR calc Af Amer: >60 mL/min (ref >60)
GFR calc non Af Amer: >60 mL/min (ref >60)
Glucose, Bld: 124 mg/dL — ABNORMAL HIGH (ref 70–99)
Potassium: 4.2 mmol/L (ref 3.5–5.1)
Sodium: 143 mmol/L (ref 135–145)

## 2019-10-03 LAB — CBC
HCT: 41.1 % (ref 36.0–46.0)
Hemoglobin: 13.7 g/dL (ref 12.0–15.0)
MCH: 28.9 pg (ref 26.0–34.0)
MCHC: 33.3 g/dL (ref 30.0–36.0)
MCV: 86.7 fL (ref 80.0–100.0)
Platelets: 231 10*3/uL (ref 150–400)
RBC: 4.74 MIL/uL (ref 3.87–5.11)
RDW: 15.1 % (ref 11.5–15.5)
WBC: 7 10*3/uL (ref 4.0–10.5)
nRBC: 0 % (ref 0.0–0.2)

## 2019-10-03 LAB — POC SARS CORONAVIRUS 2 AG -  ED: SARS Coronavirus 2 Ag: NEGATIVE

## 2019-10-03 LAB — SARS CORONAVIRUS 2 BY RT PCR (HOSPITAL ORDER, PERFORMED IN ~~LOC~~ HOSPITAL LAB): SARS Coronavirus 2: NEGATIVE

## 2019-10-03 MED ORDER — IPRATROPIUM BROMIDE 0.02 % IN SOLN
0.5000 mg | Freq: Once | RESPIRATORY_TRACT | Status: AC
  Administered 2019-10-03: 0.5 mg via RESPIRATORY_TRACT
  Filled 2019-10-03: qty 2.5

## 2019-10-03 MED ORDER — ALBUTEROL (5 MG/ML) CONTINUOUS INHALATION SOLN
10.0000 mg/h | INHALATION_SOLUTION | Freq: Once | RESPIRATORY_TRACT | Status: AC
  Administered 2019-10-03: 10 mg/h via RESPIRATORY_TRACT
  Filled 2019-10-03: qty 20

## 2019-10-03 MED ORDER — DOXYCYCLINE HYCLATE 100 MG PO TABS
100.0000 mg | ORAL_TABLET | Freq: Once | ORAL | Status: AC
  Administered 2019-10-03: 100 mg via ORAL
  Filled 2019-10-03: qty 1

## 2019-10-03 MED ORDER — METHYLPREDNISOLONE SODIUM SUCC 125 MG IJ SOLR
125.0000 mg | Freq: Once | INTRAMUSCULAR | Status: AC
  Administered 2019-10-03: 125 mg via INTRAVENOUS
  Filled 2019-10-03: qty 2

## 2019-10-03 NOTE — ED Provider Notes (Signed)
[facility] EMERGENCY DEPARTMENT Provider Note   CSN: [id number] Arrival date & time: 10/03/19  1919     History Chief Complaint  Patient presents with  . Shortness of Breath    Courtney Zuniga is a 69 y.o. female.  HPI     69 year old female comes in a chief complaint of shortness of breath. Patient has history of COPD, CAD, lung cancer and is on oxygen at home.  She continues to use cigarettes.  Patient reports that she has had increased cough and shortness of breath over the last week.  She typically does not need oxygen around-the-clock, but now she is requiring it constantly.  She has a cough that is producing phlegm, which is unusual.  She has increased wheezing.  Patient has prior history of lung cancer.  She denies any history of PE, DVT.  She denies any pain with deep inspiration or any severe chest pain.  She has been taking breathing treatments at home without significant relief.  Patient is vaccinated with COVID-19.  No COVID-19 exposures.  Past Medical History:  Diagnosis Date  . Anemia    as a young woman  . Arthritis    osteoartritis  . Asthma   . Brain tumor (benign) ([place]) 2005 [facility]   Benign  . Chronic headaches   . Chronic hip pain   . Chronic pain   . COPD (chronic obstructive pulmonary disease) ([place])   . Coronary artery disease   . Depression   . Depression 05/15/2016  . Encounter for antineoplastic chemotherapy 01/10/2016  . GERD (gastroesophageal reflux disease)   . Hypertension   . Lung cancer ([place]) dx'd 01/2016   currently on chemo and radiation   . NSTEMI (non-ST elevated myocardial infarction) ([employer]) yrs ago  . On home O2    qhs 2 liters at hs and prn  . Pneumonia last time 2 yrs ago  . Shortness of breath dyspnea    with activity    Patient Active Problem List   Diagnosis Date Noted  . Chronic nonintractable headache   . Acute on chronic respiratory failure with hypoxia ([place]) 06/16/2018  . Influenza A 06/16/2018  . Severe  protein-calorie malnutrition ([place]) 06/16/2018  . GERD (gastroesophageal reflux disease) 06/16/2018  . PNA (pneumonia) 05/05/2018  . Sepsis ([place]) 05/05/2018  . Chronic respiratory failure ([place]) 05/05/2018  . Encounter for antineoplastic immunotherapy 04/23/2017  . Depression 05/15/2016  . Adenocarcinoma of right lung, stage 3 ([place]) 01/10/2016  . Encounter for antineoplastic chemotherapy 01/10/2016  . Lung mass 01/03/2016  . Arthritis   . Subepithelial gastric mass   . Mucosal abnormality of stomach   . Abdominal pain 08/08/2015  . Abnormal CT of the abdomen 08/08/2015  . Influenza with pneumonia 06/27/2014  . Hypoxia 06/26/2014  . COPD with acute exacerbation ([place]) 06/26/2014  . CAP (community acquired pneumonia)   . Chest pain, rule out acute myocardial infarction   . Asthma, chronic   . Chest pain 02/16/2014  . HTN (hypertension) 02/16/2014  . DDD (degenerative disc disease), lumbar 02/04/2011  . Lumbar herniated disc 01/09/2011    Past Surgical History:  Procedure Laterality Date  . CHOLECYSTECTOMY    . COLONOSCOPY  2015   Results requested from [facility]  . COLONOSCOPY    . ESOPHAGOGASTRODUODENOSCOPY N/A 08/14/2015   Procedure: ESOPHAGOGASTRODUODENOSCOPY (EGD);  Surgeon: Daneil Dolin, MD;  [facility];  Service: Endoscopy;  Laterality: N/A;  215   . ESOPHAGOGASTRODUODENOSCOPY (EGD) WITH PROPOFOL N/A 09/13/2015  Procedure: ESOPHAGOGASTRODUODENOSCOPY (EGD) WITH PROPOFOL;  Surgeon: Milus Banister, MD;  [facility];  Service: Endoscopy;  Laterality: N/A;  . EUS N/A 03/12/2017   Procedure: UPPER ENDOSCOPIC ULTRASOUND (EUS) RADIAL;  Surgeon: Milus Banister, MD;  [facility];  Service: Endoscopy;  Laterality: N/A;  . TUMOR REMOVAL  2005   Benign  . UPPER ESOPHAGEAL ENDOSCOPIC ULTRASOUND (EUS)  09/13/2015   Procedure: UPPER ESOPHAGEAL ENDOSCOPIC ULTRASOUND (EUS);  Surgeon: Milus Banister, MD;  Location: [facility];  Service:  Endoscopy;;  . VIDEO BRONCHOSCOPY WITH ENDOBRONCHIAL NAVIGATION N/A 12/31/2015   Procedure: VIDEO BRONCHOSCOPY WITH ENDOBRONCHIAL NAVIGATION;  Surgeon: Melrose Nakayama, MD;  Location: [place];  Service: Thoracic;  Laterality: N/A;  . VIDEO BRONCHOSCOPY WITH ENDOBRONCHIAL ULTRASOUND N/A 11/08/2015   Procedure: VIDEO BRONCHOSCOPY WITH ENDOBRONCHIAL ULTRASOUND;  Surgeon: Ivin Poot, MD;  Location: [facility] OR;  Service: Thoracic;  Laterality: N/A;     OB History   No obstetric history on file.     Family History  Problem Relation Age of Onset  . Ovarian cancer Mother   . Lung cancer Father   . Lung cancer Brother   . Lung cancer Brother   . Prostate cancer Brother   . Brain cancer Sister        Half sister    Social History   Tobacco Use  . Smoking status: Former Smoker    Packs/day: 1.00    Years: 38.00    Pack years: 38.00    Types: Cigarettes    Quit date: 07/26/2018    Years since quitting: 1.1  . Smokeless tobacco: Never Used  . Tobacco comment: Pt has asked doctor for med to help   Vaping Use  . Vaping Use: Never used  Substance Use Topics  . Alcohol use: No    Alcohol/week: 0.0 standard drinks  . Drug use: No    Home Medications Prior to Admission medications   Medication Sig Start Date End Date Taking? Authorizing Provider  acetaminophen (TYLENOL) 325 MG tablet Take 2 tablets (650 mg total) by mouth every 6 (six) hours as needed for mild pain, fever or headache. Patient not taking: Reported on 05/12/2019 05/08/18   Roxan Hockey, MD  albuterol (PROVENTIL HFA;VENTOLIN HFA) 108 (90 BASE) MCG/ACT inhaler Inhale 2 puffs into the lungs every 4 (four) hours as needed for shortness of breath.     [provider]  BREO ELLIPTA 100-25 MCG/INH AEPB Inhale 1 puff into the lungs daily.  04/30/17   [provider]  Cholecalciferol (VITAMIN D3) 125 MCG (5000 UT) CAPS Take 1 capsule by mouth daily. 02/17/18   [provider]  cyclobenzaprine  (FLEXERIL) 10 MG tablet Take 1 tablet (10 mg total) by mouth 2 (two) times daily. *MAy take one additional tablet as needed for muscle spasms 05/08/18   Roxan Hockey, MD  dextromethorphan-guaiFENesin [facility] DM) 30-600 MG 12hr tablet Take 1 tablet by mouth 2 (two) times daily. 06/18/18   Barton Dubois, MD  diclofenac sodium (VOLTAREN) 1 % GEL Apply topically 4 (four) times daily as needed (massge gel into affected area(s) as needed for pain).  08/11/17   [provider]  ENSURE (ENSURE) Take 237 mLs by mouth 3 (three) times daily between meals.    [provider]  ipratropium-albuterol (DUONEB) 0.5-2.5 (3) MG/3ML SOLN Take 3 mLs by nebulization 3 (three) times daily. 05/08/18   Roxan Hockey, MD  meloxicam (MOBIC) 15 MG tablet Take 15 mg by mouth daily. 02/17/19  [provider]  methocarbamol (ROBAXIN) 500 MG tablet Take 1 tablet (500 mg total) by mouth every 8 (eight) hours as needed for muscle spasms. 06/15/19   Maudie Flakes, MD  metoprolol tartrate (LOPRESSOR) 25 MG tablet Take 1 tablet (25 mg total) by mouth 2 (two) times daily. 05/08/18   Roxan Hockey, MD  nicotine (NICODERM CQ - DOSED IN MG/24 HOURS) 14 mg/24hr patch Place 1 patch (14 mg total) onto the skin daily. 05/09/18   Roxan Hockey, MD  oxyCODONE-acetaminophen (PERCOCET/ROXICET) 5-325 MG tablet Take 1 tablet by mouth every 8 (eight) hours as needed for moderate pain. Patient not taking: Reported on 05/12/2019 05/08/18   Roxan Hockey, MD  pantoprazole (PROTONIX) 40 MG tablet Take 1 tablet (40 mg total) by mouth daily. 05/08/18   Emokpae, Courage, MD  ROBITUSSIN 12 HOUR COUGH 30 MG/5ML liquid Take 30 mg by mouth every 4 (four) hours as needed for cough.  01/25/18   [provider]  topiramate (TOPAMAX) 25 MG tablet Take 1 tablet by mouth daily at bedtime X 1 week; then increase to 25mg  BID. 06/18/18   Barton Dubois, MD    Allergies    Patient has no known allergies.  Review of Systems     Review of Systems  Constitutional: Positive for activity change.  Respiratory: Positive for cough, shortness of breath and wheezing.   Cardiovascular: Negative for chest pain.  Gastrointestinal: Negative for nausea and vomiting.  Allergic/Immunologic: Positive for immunocompromised state.  Hematological: Does not bruise/bleed easily.  All other systems reviewed and are negative.   Physical Exam Updated Vital Signs BP 124/79 (BP Location: Right Arm)   Pulse (!) 108   Temp 98.5 F (36.9 C) (Oral)   Resp (!) 28   Ht 5\' 7"  (1.702 m)   Wt 62.6 kg   SpO2 98%   BMI 21.61 kg/m   Physical Exam Vitals and nursing note reviewed.  Constitutional:      General: She is not in acute distress.    Appearance: She is well-developed.     Comments: Cachectic  HENT:     Head: Normocephalic and atraumatic.  Cardiovascular:     Rate and Rhythm: Normal rate.  Pulmonary:     Effort: Pulmonary effort is normal.     Breath sounds: Decreased breath sounds present. No wheezing, rhonchi or rales.  Abdominal:     General: Bowel sounds are normal.  Musculoskeletal:     Cervical back: Normal range of motion and neck supple.     Right lower leg: No tenderness. No edema.     Left lower leg: No tenderness. No edema.  Skin:    General: Skin is warm and dry.  Neurological:     Mental Status: She is alert and oriented to person, place, and time.     ED Results / Procedures / Treatments   Labs (all labs ordered are listed, but only abnormal results are displayed) Labs Reviewed  BASIC METABOLIC PANEL - Abnormal; Notable for the following components:      Result Value   Glucose, Bld 124 (*)    All other components within normal limits  SARS CORONAVIRUS 2 BY RT PCR [facility] ORDER, [place] LAB)  CBC  POC SARS CORONAVIRUS 2 AG -  ED    EKG EKG Interpretation  Date/Time:  Monday October 03 2019 22:35:27 EDT Ventricular Rate:  91 PR Interval:    QRS Duration: 82 QT  Interval:  352 QTC Calculation: 433  R Axis:   27 Text Interpretation: Sinus rhythm Anteroseptal infarct, age indeterminate ST elevation, consider inferior injury No acute changes No significant change since last tracing Confirmed by Varney Biles [phone]) on 10/03/2019 11:09:00 PM   Radiology DG Chest 1 View  Result Date: 10/03/2019 CLINICAL DATA:  Cough and shortness of breath cough, congestion EXAM: CHEST  1 VIEW COMPARISON:  06/15/2019 FINDINGS: The lungs are hyperinflated with diffuse interstitial prominence. No focal airspace consolidation or pulmonary edema. No pleural effusion or pneumothorax. Normal cardiomediastinal contours. Bibasilar scarring is unchanged IMPRESSION: COPD without acute airspace disease. Electronically Signed   By: Ulyses Jarred M.D.   On: 10/03/2019 20:21    Procedures .Critical Care Performed by: Varney Biles, MD Authorized by: Varney Biles, MD   Critical care provider statement:    Critical care time (minutes):  45   Critical care was necessary to treat or prevent imminent or life-threatening deterioration of the following conditions:  Respiratory failure   Critical care was time spent personally by me on the following activities:  Discussions with consultants, evaluation of patient's response to treatment, examination of patient, ordering and performing treatments and interventions, ordering and review of laboratory studies, ordering and review of radiographic studies, pulse oximetry, re-evaluation of patient's condition, obtaining history from patient or surrogate and review of old charts   (including critical care time)  Medications Ordered in ED Medications  albuterol (PROVENTIL,VENTOLIN) solution continuous neb (10 mg/hr Nebulization Given 10/03/19 2301)  ipratropium (ATROVENT) nebulizer solution 0.5 mg (0.5 mg Nebulization Given 10/03/19 2301)  methylPREDNISolone sodium succinate (SOLU-MEDROL) 125 mg/2 mL injection 125 mg (125 mg Intravenous Given  10/03/19 2305)  doxycycline (VIBRA-TABS) tablet 100 mg (100 mg Oral Given 10/03/19 2305)    ED Course  I have reviewed the triage vital signs and the nursing notes.  Pertinent labs & imaging results that were available during my care of the patient were reviewed by me and considered in my medical decision making (see chart for details).  Clinical Course as of Oct 02 2349  Mon Oct 03, 2019  2350 Patient is still in the midst of getting a breathing treatment.  Her lung exam is essentially unchanged.  She will need to be followed up by Dr. Tomi Bamberger, incoming staff.  If patient continues to have shortness of breath and oxygen requirement then she will need to be admitted to the hospital.   [AN]    Clinical Course User Index [AN] Varney Biles, MD   MDM Rules/Calculators/A&P                          Patient comes in a chief complaint of cough and shortness of breath. She has history of advanced COPD, CAD.  She has no severe chest pain and clinically does not appear she has ACS.   Patient has history of cancer.  PE considered in the differential, however it is lower.  Most likely, we suspect that she has COPD exacerbation.  Plan is to get x-ray, breathing treatments and reassess.  Final Clinical Impression(s) / ED Diagnoses Final diagnoses:  COPD exacerbation ([place])  Acute on chronic respiratory failure with hypoxemia [facility])    Rx / DC Orders ED Discharge Orders    None       Varney Biles, MD 10/03/19 2351

## 2019-10-03 NOTE — ED Notes (Signed)
Patient placed on 4 liters of oxygen in triage.

## 2019-10-03 NOTE — ED Triage Notes (Signed)
Patient presents with shortness of breath. Patient is normally on 2 liters of oxygen at home but did not have oxygen when she arrived in triage. Patient states shortness of breath since last week but became worse today.

## 2019-10-04 MED ORDER — DOXYCYCLINE HYCLATE 100 MG PO CAPS
100.0000 mg | ORAL_CAPSULE | Freq: Two times a day (BID) | ORAL | 0 refills | Status: DC
Start: 2019-10-04 — End: 2019-10-23

## 2019-10-04 MED ORDER — ACETAMINOPHEN 325 MG PO TABS
650.0000 mg | ORAL_TABLET | Freq: Once | ORAL | Status: AC
  Administered 2019-10-04: 650 mg via ORAL
  Filled 2019-10-04: qty 2

## 2019-10-04 MED ORDER — PREDNISONE 20 MG PO TABS
ORAL_TABLET | ORAL | 0 refills | Status: DC
Start: 2019-10-04 — End: 2019-10-23

## 2019-10-04 NOTE — Discharge Instructions (Addendum)
Use your oxygen as needed, continue using your inhalers or nebulizers for shortness of breath.  Take the antibiotics and the steroids until gone.  Return to the emergency department if you feel worse.

## 2019-10-04 NOTE — ED Provider Notes (Signed)
Patient was left at change of shift to be rechecked after getting her continuous nebulizer.  Patient has a history of COPD and is on home oxygen.  She states she feels much improved.  She feels like she can be discharged home.  Her main complaint now is that she has a headache.  She was given Tylenol.  She states that was something she would normally take for her headaches.   Patient seemed a little bit short of breath when I looked at her however when I listen to her she has good air movement, there is no wheezing or rhonchi.    At this point I think patient can be discharged home.  She denies history of diabetes.  She was discharged home on doxycycline and prednisone.  She states they normally put her on prednisone when she has a flareup.  Diagnoses that have been ruled out:  None  Diagnoses that are still under consideration:  None  Final diagnoses:  COPD exacerbation ([place])  Acute on chronic respiratory failure with hypoxemia [facility])   ED Discharge Orders         Ordered    doxycycline (VIBRAMYCIN) 100 MG capsule  2 times daily     Discontinue  Reprint     10/04/19 0032    predniSONE (DELTASONE) 20 MG tablet     Discontinue  Reprint     10/04/19 0032         Plan discharge  Rolland Porter, MD, Barbette Or, MD 10/04/19 [phone]

## 2019-10-04 NOTE — Progress Notes (Signed)
[place] OFFICE PROGRESS NOTE  Barry Dienes, NP No address on file  DIAGNOSIS: Recurrent non-small cell lung cancer initially diagnosed as stage IIIA (T1b, N2, M0) non-small cell lung cancer presented with right middle lobe pulmonary nodule, mediastinal lymphadenopathy and highly suspicious for small nodule in the left upper lobe that could change her stage to stage IV that could present another synchronous primary lesion in the left upper lobe. This was diagnosed in September 2017.  PRIOR THERAPY: 1) Concurrent chemoradiation with weekly carboplatin for AUC of 2 and paclitaxel 45 MG/M2 status post 6 cycles last dose was given 02/25/2016 with partial response. 2) Consolidation chemotherapy with carboplatin for AUC of 5 and paclitaxel 175 MG/M2 every 3 weeks with Neulasta support. First dose 04/22/2016. Status post 3 cycles  CURRENT THERAPY: Second line treatment with immunotherapy with Nivolumab 480 mg IV every 4 weeks status post 31cycles  INTERVAL HISTORY: Courtney Zuniga 69 y.o. female returns to the clinic for a follow up visit. The patient is feeling fair today without any concerning complaints except she was seen in the ER on 6/21 for the chief complaint of increased cough and SOB. She was treated for a COPD exacerbation with nebulizer treatment. She is not feeling significantly better with the prednisone taper that she is on. She is on 60 mg for 3 days, followed by 40 mg for 2 days, followed by 20 mg for 3 days. She took her last dose of 60 mg this AM. She also uses her breo and albuterol regularly. She does not have a pulmonologist and this is managed by her PCP. Her PCP left the practice and she is transitioning to another provider in the practice. She wears oxygen intermittently as needed at home. She has been needing to use it more frequently.   The patient tolerated her last cycle of immunotherapy with Nivolumab well without any adverse effects. Denies any hemoptysis.  Denies any nausea, vomiting, or diarrhea. She reports her baseline constipation. She experiences a chronic headaches.She had a history of a brain tumor in 2005. Denies any rashes or skin changes. Her restaging CT scan is scheduled for 6/29. The patient is here today for evaluation prior to starting cycle # 32    MEDICAL HISTORY: Past Medical History:  Diagnosis Date  . Anemia    as a young woman  . Arthritis    osteoartritis  . Asthma   . Brain tumor (benign) ([place]) 2005 [facility]   Benign  . Chronic headaches   . Chronic hip pain   . Chronic pain   . COPD (chronic obstructive pulmonary disease) ([place])   . Coronary artery disease   . Depression   . Depression 05/15/2016  . Encounter for antineoplastic chemotherapy 01/10/2016  . GERD (gastroesophageal reflux disease)   . Hypertension   . Lung cancer ([place]) dx'd 01/2016   currently on chemo and radiation   . NSTEMI (non-ST elevated myocardial infarction) ([place]) yrs ago  . On home O2    qhs 2 liters at hs and prn  . Pneumonia last time 2 yrs ago  . Shortness of breath dyspnea    with activity    ALLERGIES:  has No Known Allergies.  MEDICATIONS:  Current Outpatient Medications  Medication Sig Dispense Refill  . acetaminophen (TYLENOL) 325 MG tablet Take 2 tablets (650 mg total) by mouth every 6 (six) hours as needed for mild pain, fever or headache. (Patient not taking: Reported on 05/12/2019) 12 tablet 2  . albuterol (PROVENTIL  HFA;VENTOLIN HFA) 108 (90 BASE) MCG/ACT inhaler Inhale 2 puffs into the lungs every 4 (four) hours as needed for shortness of breath.     Marland Kitchen BREO ELLIPTA 100-25 MCG/INH AEPB Inhale 1 puff into the lungs daily.     . Cholecalciferol (VITAMIN D3) 125 MCG (5000 UT) CAPS Take 1 capsule by mouth daily.  0  . cyclobenzaprine (FLEXERIL) 10 MG tablet Take 1 tablet (10 mg total) by mouth 2 (two) times daily. *MAy take one additional tablet as needed for muscle spasms 60 tablet 2  . dextromethorphan-guaiFENesin (MUCINEX  DM) 30-600 MG 12hr tablet Take 1 tablet by mouth 2 (two) times daily. 40 tablet 0  . diclofenac sodium (VOLTAREN) 1 % GEL Apply topically 4 (four) times daily as needed (massge gel into affected area(s) as needed for pain).   1  . doxycycline (VIBRAMYCIN) 100 MG capsule Take 1 capsule (100 mg total) by mouth 2 (two) times daily. 20 capsule 0  . ENSURE (ENSURE) Take 237 mLs by mouth 3 (three) times daily between meals.    Marland Kitchen ipratropium-albuterol (DUONEB) 0.5-2.5 (3) MG/3ML SOLN Take 3 mLs by nebulization 3 (three) times daily. 360 mL 2  . meloxicam (MOBIC) 15 MG tablet Take 15 mg by mouth daily.    . methocarbamol (ROBAXIN) 500 MG tablet Take 1 tablet (500 mg total) by mouth every 8 (eight) hours as needed for muscle spasms. 30 tablet 0  . metoprolol tartrate (LOPRESSOR) 25 MG tablet Take 1 tablet (25 mg total) by mouth 2 (two) times daily. 60 tablet 1  . nicotine (NICODERM CQ - DOSED IN MG/24 HOURS) 14 mg/24hr patch Place 1 patch (14 mg total) onto the skin daily. 28 patch 0  . oxyCODONE-acetaminophen (PERCOCET/ROXICET) 5-325 MG tablet Take 1 tablet by mouth every 8 (eight) hours as needed for moderate pain. (Patient not taking: Reported on 05/12/2019) 12 tablet 0  . pantoprazole (PROTONIX) 40 MG tablet Take 1 tablet (40 mg total) by mouth daily. 30 tablet 3  . predniSONE (DELTASONE) 20 MG tablet Take 3 po QD x 3d , then 2 po QD x 3d then 1 po QD x 3d 18 tablet 0  . ROBITUSSIN 12 HOUR COUGH 30 MG/5ML liquid Take 30 mg by mouth every 4 (four) hours as needed for cough.     . topiramate (TOPAMAX) 25 MG tablet Take 1 tablet by mouth daily at bedtime X 1 week; then increase to 25mg  BID. 45 tablet 2   No current facility-administered medications for this visit.    SURGICAL HISTORY:  Past Surgical History:  Procedure Laterality Date  . CHOLECYSTECTOMY    . COLONOSCOPY  2015   Results requested from [facility]  . COLONOSCOPY    . ESOPHAGOGASTRODUODENOSCOPY N/A 08/14/2015   Procedure:  ESOPHAGOGASTRODUODENOSCOPY (EGD);  Surgeon: Daneil Dolin, MD;  Location: [facility];  Service: Endoscopy;  Laterality: N/A;  215   . ESOPHAGOGASTRODUODENOSCOPY (EGD) WITH PROPOFOL N/A 09/13/2015   Procedure: ESOPHAGOGASTRODUODENOSCOPY (EGD) WITH PROPOFOL;  Surgeon: Milus Banister, MD;  Location: [facility];  Service: Endoscopy;  Laterality: N/A;  . EUS N/A 03/12/2017   Procedure: UPPER ENDOSCOPIC ULTRASOUND (EUS) RADIAL;  Surgeon: Milus Banister, MD;  Location: [facility];  Service: Endoscopy;  Laterality: N/A;  . TUMOR REMOVAL  2005   Benign  . UPPER ESOPHAGEAL ENDOSCOPIC ULTRASOUND (EUS)  09/13/2015   Procedure: UPPER ESOPHAGEAL ENDOSCOPIC ULTRASOUND (EUS);  Surgeon: Milus Banister, MD;  Location: [facility];  Service: Endoscopy;;  . VIDEO  BRONCHOSCOPY WITH ENDOBRONCHIAL NAVIGATION N/A 12/31/2015   Procedure: VIDEO BRONCHOSCOPY WITH ENDOBRONCHIAL NAVIGATION;  Surgeon: Melrose Nakayama, MD;  Location: [facility];  Service: Thoracic;  Laterality: N/A;  . VIDEO BRONCHOSCOPY WITH ENDOBRONCHIAL ULTRASOUND N/A 11/08/2015   Procedure: VIDEO BRONCHOSCOPY WITH ENDOBRONCHIAL ULTRASOUND;  Surgeon: Ivin Poot, MD;  Location: [facility] OR;  Service: Thoracic;  Laterality: N/A;    REVIEW OF SYSTEMS:   Review of Systems  Constitutional: Negative for appetite change, chills, fatigue, fever and unexpected weight change.  HENT: Negative for mouth sores, nosebleeds, sore throat and trouble swallowing.   Eyes: Negative for eye problems and icterus.  Respiratory: Positive for shortness of breath with exertion, intermittent wheezing,and chronic cough.Negative for hemoptysis. Cardiovascular: Negative for chest pain and leg swelling.  Gastrointestinal: Negative for abdominal pain, constipation, diarrhea, nausea and vomiting.  Genitourinary: Negative for bladder incontinence, difficulty urinating, dysuria, frequency and hematuria.   Musculoskeletal: Negative for back pain, gait problem, neck pain and neck  stiffness.  Skin: Negative for itching and rash.  Neurological: Positive for chronic headaches. Negative for dizziness, extremity weakness, gait problem,  light-headedness and seizures.  Hematological: Negative for adenopathy. Does not bruise/bleed easily.  Psychiatric/Behavioral: Negative for confusion, depression and sleep disturbance. The patient is not nervous/anxious.     PHYSICAL EXAMINATION:  Blood pressure (!) 144/80, pulse 99, temperature 98.1 F (36.7 C), temperature source Temporal, resp. rate (!) 21, height 5\' 7"  (1.702 m), weight 142 lb 4.8 oz (64.5 kg), SpO2 93 %.  ECOG PERFORMANCE STATUS: 1 - Symptomatic but completely ambulatory  Physical Exam  Constitutional: Oriented to person, place, and time and well-developed, well-nourished, and in no distress.  HENT:  Head: Normocephalic and atraumatic.  Mouth/Throat: Oropharynx is clear and moist. No oropharyngeal exudate.  Eyes: Conjunctivae are normal. Right eye exhibits no discharge. Left eye exhibits no discharge. No scleral icterus.  Neck: Normal range of motion. Neck supple.  Cardiovascular: Normal rate, regular rhythm, normal heart sounds and intact distal pulses.  Pulmonary/Chest: Effort normal and breath soundsquiet in all lung fields with some rhonchi. No respiratory distress. No rales.  Abdominal: Soft. Bowel sounds are normal. Exhibits no distension and no mass. There is no tenderness.  Musculoskeletal: Normal range of motion. Exhibits no edema.  Lymphadenopathy:  No cervical adenopathy.  Neurological: Left facial twitching and weakness (baseline from brain tumor in 2005).Alert and oriented to person, place, and time. Exhibits normal muscle tone. Gait normal. Coordination normal.  Skin: Skin is warm and dry. No rash noted. Not diaphoretic. No erythema. No pallor.  Psychiatric: Mood, memory and judgment normal.  Vitals reviewed.  LABORATORY DATA: Lab Results  Component Value Date   WBC 7.4 10/06/2019   HGB  12.6 10/06/2019   HCT 36.9 10/06/2019   MCV 85.8 10/06/2019   PLT 246 10/06/2019      Chemistry      Component Value Date/Time   NA 143 10/03/2019 2012   NA 144 04/09/2017 0948   K 4.2 10/03/2019 2012   K 4.3 04/09/2017 0948   CL 100 10/03/2019 2012   CO2 29 10/03/2019 2012   CO2 25 04/09/2017 0948   BUN 17 10/03/2019 2012   BUN 16.8 04/09/2017 0948   CREATININE 0.59 10/03/2019 2012   CREATININE 0.70 09/08/2019 0828   CREATININE 0.8 04/09/2017 0948      Component Value Date/Time   CALCIUM 10.0 10/03/2019 2012   CALCIUM 9.1 04/09/2017 0948   ALKPHOS 272 (H) 09/08/2019 0828   ALKPHOS 216 (H) 04/09/2017 5456  AST 19 09/08/2019 0828   AST 12 04/09/2017 0948   ALT 9 09/08/2019 0828   ALT 13 04/09/2017 0948   BILITOT <0.2 (L) 09/08/2019 0828   BILITOT 0.22 04/09/2017 0948       RADIOGRAPHIC STUDIES:  DG Chest 1 View  Result Date: 10/03/2019 CLINICAL DATA:  Cough and shortness of breath cough, congestion EXAM: CHEST  1 VIEW COMPARISON:  06/15/2019 FINDINGS: The lungs are hyperinflated with diffuse interstitial prominence. No focal airspace consolidation or pulmonary edema. No pleural effusion or pneumothorax. Normal cardiomediastinal contours. Bibasilar scarring is unchanged IMPRESSION: COPD without acute airspace disease. Electronically Signed   By: Ulyses Jarred M.D.   On: 10/03/2019 20:21     ASSESSMENT/PLAN:  This is a very pleasant81 year old African-American female with recurrent metastatic non-small cell lung cancer, adenocarcinoma of the right middle lobe. She was initially diagnosed as stage IIIa in September 2017.   She completed concurrent chemoradiation followed by consolidation with carboplatin and paclitaxel. She is status post3 cycles. The patient was on observation but a restagingCTscan showed evidence of disease progression.  Sheis currently undergoing treatment withsecond line with immunotherapy with nivolumab 480 mg IV every 4 weeks. She is  status post31cycles.  The patient was seen with Dr. Julien Nordmann today. She is still on prednisone. We will delay her treatment by one week until she completes her prednisone since she is on immunotherapy.   She was instructed to keep her scan appointment on 6/29 as scheduled.   We will see her back for a follow up visit in 1 week for evaluation and to review her scan before starting cycle #33.   The patient's blood sugar was a little low today. Called the patient. She said she would eat upon returning home.   The patient was advised to call immediately if she has any concerning symptoms in the interval. The patient voices understanding of current disease status and treatment options and is in agreement with the current care plan. All questions were answered. The patient knows to call the clinic with any problems, questions or concerns. We can certainly see the patient much sooner if necessary  No orders of the defined types were placed in this encounter.    Chrislynn Mosely L Kiauna Zywicki, PA-C 10/06/19  ADDENDUM: Hematology/Oncology Attending: I had a face-to-face encounter with the patient today.  I recommended her care plan.  This is a very pleasant 69 years old African-American female with recurrent non-small cell lung cancer, adenocarcinoma.  She is currently undergoing treatment with immunotherapy with nivolumab every 4 weeks.  The patient has been tolerating her treatment well with no concerning adverse effects.  She is status post 31 cycles. She was recently seen at the emergency department for COPD exacerbation and she is currently on a tapered dose of prednisone 60 mg p.o. daily.  She is expected to taper her treatment in the next few days. I recommended for the patient to hold her treatment with nivolumab today because of the concurrent treatment with prednisone. We will resume her treatment next week once she is off the prednisone. The patient was advised to call immediately if she  has any other concerning symptoms in the interval. Disclaimer: This note was dictated with voice recognition software. Similar sounding words can inadvertently be transcribed and may be missed upon review. Eilleen Kempf, MD 10/06/19

## 2019-10-06 ENCOUNTER — Other Ambulatory Visit: Payer: Self-pay

## 2019-10-06 ENCOUNTER — Inpatient Hospital Stay: Payer: Medicare HMO

## 2019-10-06 ENCOUNTER — Inpatient Hospital Stay: Payer: Medicare HMO | Attending: Oncology | Admitting: Physician Assistant

## 2019-10-06 ENCOUNTER — Encounter: Payer: Self-pay | Admitting: Physician Assistant

## 2019-10-06 VITALS — BP 144/80 | HR 99 | Temp 98.1°F | Resp 21 | Ht 67.0 in | Wt 142.3 lb

## 2019-10-06 DIAGNOSIS — J449 Chronic obstructive pulmonary disease, unspecified: Secondary | ICD-10-CM | POA: Diagnosis not present

## 2019-10-06 DIAGNOSIS — Z5112 Encounter for antineoplastic immunotherapy: Secondary | ICD-10-CM | POA: Diagnosis not present

## 2019-10-06 DIAGNOSIS — C342 Malignant neoplasm of middle lobe, bronchus or lung: Secondary | ICD-10-CM | POA: Diagnosis present

## 2019-10-06 DIAGNOSIS — C3491 Malignant neoplasm of unspecified part of right bronchus or lung: Secondary | ICD-10-CM

## 2019-10-06 DIAGNOSIS — Z9981 Dependence on supplemental oxygen: Secondary | ICD-10-CM | POA: Insufficient documentation

## 2019-10-06 DIAGNOSIS — I10 Essential (primary) hypertension: Secondary | ICD-10-CM

## 2019-10-06 DIAGNOSIS — K5900 Constipation, unspecified: Secondary | ICD-10-CM | POA: Insufficient documentation

## 2019-10-06 DIAGNOSIS — R519 Headache, unspecified: Secondary | ICD-10-CM | POA: Diagnosis not present

## 2019-10-06 DIAGNOSIS — C3490 Malignant neoplasm of unspecified part of unspecified bronchus or lung: Secondary | ICD-10-CM

## 2019-10-06 DIAGNOSIS — Z79899 Other long term (current) drug therapy: Secondary | ICD-10-CM | POA: Insufficient documentation

## 2019-10-06 LAB — CMP (CANCER CENTER ONLY)
ALT: 25 U/L (ref 0–44)
AST: 24 U/L (ref 15–41)
Albumin: 3.3 g/dL — ABNORMAL LOW (ref 3.5–5.0)
Alkaline Phosphatase: 210 U/L — ABNORMAL HIGH (ref 38–126)
Anion gap: 9 (ref 5–15)
BUN: 20 mg/dL (ref 8–23)
CO2: 30 mmol/L (ref 22–32)
Calcium: 9.3 mg/dL (ref 8.9–10.3)
Chloride: 106 mmol/L (ref 98–111)
Creatinine: 0.64 mg/dL (ref 0.44–1.00)
GFR, Est AFR Am: >60 mL/min (ref >60)
GFR, Estimated: >60 mL/min (ref >60)
Glucose, Bld: 61 mg/dL — ABNORMAL LOW (ref 70–99)
Potassium: 3.8 mmol/L (ref 3.5–5.1)
Sodium: 145 mmol/L (ref 135–145)
Total Bilirubin: <0.2 mg/dL — ABNORMAL LOW (ref 0.3–1.2)
Total Protein: 6.7 g/dL (ref 6.5–8.1)

## 2019-10-06 LAB — CBC WITH DIFFERENTIAL (CANCER CENTER ONLY)
Abs Immature Granulocytes: 0.04 10*3/uL (ref 0.00–0.07)
Basophils Absolute: 0 10*3/uL (ref 0.0–0.1)
Basophils Relative: 0 %
Eosinophils Absolute: 0 10*3/uL (ref 0.0–0.5)
Eosinophils Relative: 0 %
HCT: 36.9 % (ref 36.0–46.0)
Hemoglobin: 12.6 g/dL (ref 12.0–15.0)
Immature Granulocytes: 1 %
Lymphocytes Relative: 18 %
Lymphs Abs: 1.3 10*3/uL (ref 0.7–4.0)
MCH: 29.3 pg (ref 26.0–34.0)
MCHC: 34.1 g/dL (ref 30.0–36.0)
MCV: 85.8 fL (ref 80.0–100.0)
Monocytes Absolute: 0.4 10*3/uL (ref 0.1–1.0)
Monocytes Relative: 5 %
Neutro Abs: 5.7 10*3/uL (ref 1.7–7.7)
Neutrophils Relative %: 76 %
Platelet Count: 246 10*3/uL (ref 150–400)
RBC: 4.3 MIL/uL (ref 3.87–5.11)
RDW: 14.9 % (ref 11.5–15.5)
WBC Count: 7.4 10*3/uL (ref 4.0–10.5)
nRBC: 0 % (ref 0.0–0.2)

## 2019-10-06 LAB — TSH: TSH: 2.6 u[IU]/mL (ref 0.308–3.960)

## 2019-10-11 ENCOUNTER — Other Ambulatory Visit: Payer: Self-pay

## 2019-10-11 ENCOUNTER — Encounter (HOSPITAL_COMMUNITY): Payer: Self-pay

## 2019-10-11 ENCOUNTER — Ambulatory Visit (HOSPITAL_COMMUNITY)
Admission: RE | Admit: 2019-10-11 | Discharge: 2019-10-11 | Disposition: A | Discharge status: Home / Self Care | Payer: Medicare HMO | Source: Ambulatory Visit | Attending: Internal Medicine | Admitting: Internal Medicine

## 2019-10-11 DIAGNOSIS — C3490 Malignant neoplasm of unspecified part of unspecified bronchus or lung: Secondary | ICD-10-CM | POA: Diagnosis present

## 2019-10-11 MED ORDER — SODIUM CHLORIDE (PF) 0.9 % IJ SOLN
INTRAMUSCULAR | Status: AC
  Filled 2019-10-11: qty 50

## 2019-10-11 MED ORDER — IOHEXOL 300 MG/ML  SOLN
100.0000 mL | Freq: Once | INTRAMUSCULAR | Status: AC | PRN
  Administered 2019-10-11: 100 mL via INTRAVENOUS

## 2019-10-12 NOTE — Progress Notes (Deleted)
[place] OFFICE PROGRESS NOTE  Barry Dienes, NP No address on file  DIAGNOSIS: Recurrent non-small cell lung cancer initially diagnosed as stage IIIA (T1b, N2, M0) non-small cell lung cancer presented with right middle lobe pulmonary nodule, mediastinal lymphadenopathy and highly suspicious for small nodule in the left upper lobe that could change her stage to stage IV that could present another synchronous primary lesion in the left upper lobe. This was diagnosed in September 2017.  PRIOR THERAPY: PRIOR THERAPY: 1) Concurrent chemoradiation with weekly carboplatin for AUC of 2 and paclitaxel 45 MG/M2 status post 6 cycles last dose was given 02/25/2016 with partial response. 2) Consolidation chemotherapy with carboplatin for AUC of 5 and paclitaxel 175 MG/M2 every 3 weeks with Neulasta support. First dose 04/22/2016. Status post 3 cycles  CURRENT THERAPY: Second line treatment with immunotherapy with Nivolumab 480 mg IV every 4 weeks status post 31cycles  INTERVAL HISTORY: Courtney Zuniga 69 y.o. female returns to clinic today for follow-up visit.  The patient was seen last week to start cycle #32 of her treatment with nivolumab; however, the patient was currently undergoing a prednisone taper for a COPD exacerbation.  Therefore, the patient's treatment was delayed by 1 week until today.  Since her last appointment last week, the patient denies any fever, chills, night sweats, or weight loss.  She continues to endorse increased cough and shortness of breath.  She completed her prednisone taper __without any _difference.  The patient wears oxygen via nasal cannula for her COPD intermittently as needed at home.  She has noticed that she has needed to use oxygen more frequently.  Regarding her treatment with nivolumab, the patient tolerates treatment without any adverse side effects.  She denies any nausea, vomiting, or diarrhea.  She has baseline constipation.  She experiences  chronic headaches due to her history of a brain tumor in 2005.  She denies any rashes or skin changes.  The patient recently had a restaging CT scan performed.  She is here today for evaluation before starting cycle number 32 of her treatment.    MEDICAL HISTORY: Past Medical History:  Diagnosis Date  . Anemia    as a young woman  . Arthritis    osteoartritis  . Asthma   . Brain tumor (benign) ([place]) 2005 [facility]   Benign  . Chronic headaches   . Chronic hip pain   . Chronic pain   . COPD (chronic obstructive pulmonary disease) ([place])   . Coronary artery disease   . Depression   . Depression 05/15/2016  . Encounter for antineoplastic chemotherapy 01/10/2016  . GERD (gastroesophageal reflux disease)   . Hypertension   . Lung cancer ([place]) dx'd 01/2016   currently on chemo and radiation   . NSTEMI (non-ST elevated myocardial infarction) ([place]) yrs ago  . On home O2    qhs 2 liters at hs and prn  . Pneumonia last time 2 yrs ago  . Shortness of breath dyspnea    with activity    ALLERGIES:  has No Known Allergies.  MEDICATIONS:  Current Outpatient Medications  Medication Sig Dispense Refill  . acetaminophen (TYLENOL) 325 MG tablet Take 2 tablets (650 mg total) by mouth every 6 (six) hours as needed for mild pain, fever or headache. (Patient not taking: Reported on 05/12/2019) 12 tablet 2  . albuterol (PROVENTIL HFA;VENTOLIN HFA) 108 (90 BASE) MCG/ACT inhaler Inhale 2 puffs into the lungs every 4 (four) hours as needed for shortness of breath.     Marland Kitchen  BREO ELLIPTA 100-25 MCG/INH AEPB Inhale 1 puff into the lungs daily.     . Cholecalciferol (VITAMIN D3) 125 MCG (5000 UT) CAPS Take 1 capsule by mouth daily.  0  . cyclobenzaprine (FLEXERIL) 10 MG tablet Take 1 tablet (10 mg total) by mouth 2 (two) times daily. *MAy take one additional tablet as needed for muscle spasms 60 tablet 2  . dextromethorphan-guaiFENesin (MUCINEX DM) 30-600 MG 12hr tablet Take 1 tablet by mouth 2 (two) times daily.  40 tablet 0  . diclofenac sodium (VOLTAREN) 1 % GEL Apply topically 4 (four) times daily as needed (massge gel into affected area(s) as needed for pain).   1  . doxycycline (VIBRAMYCIN) 100 MG capsule Take 1 capsule (100 mg total) by mouth 2 (two) times daily. 20 capsule 0  . ENSURE (ENSURE) Take 237 mLs by mouth 3 (three) times daily between meals.    Marland Kitchen ipratropium-albuterol (DUONEB) 0.5-2.5 (3) MG/3ML SOLN Take 3 mLs by nebulization 3 (three) times daily. 360 mL 2  . meloxicam (MOBIC) 15 MG tablet Take 15 mg by mouth daily.    . methocarbamol (ROBAXIN) 500 MG tablet Take 1 tablet (500 mg total) by mouth every 8 (eight) hours as needed for muscle spasms. 30 tablet 0  . metoprolol tartrate (LOPRESSOR) 25 MG tablet Take 1 tablet (25 mg total) by mouth 2 (two) times daily. 60 tablet 1  . nicotine (NICODERM CQ - DOSED IN MG/24 HOURS) 14 mg/24hr patch Place 1 patch (14 mg total) onto the skin daily. 28 patch 0  . oxyCODONE-acetaminophen (PERCOCET/ROXICET) 5-325 MG tablet Take 1 tablet by mouth every 8 (eight) hours as needed for moderate pain. (Patient not taking: Reported on 05/12/2019) 12 tablet 0  . pantoprazole (PROTONIX) 40 MG tablet Take 1 tablet (40 mg total) by mouth daily. 30 tablet 3  . predniSONE (DELTASONE) 20 MG tablet Take 3 po QD x 3d , then 2 po QD x 3d then 1 po QD x 3d 18 tablet 0  . ROBITUSSIN 12 HOUR COUGH 30 MG/5ML liquid Take 30 mg by mouth every 4 (four) hours as needed for cough.     . topiramate (TOPAMAX) 25 MG tablet Take 1 tablet by mouth daily at bedtime X 1 week; then increase to 25mg  BID. 45 tablet 2   No current facility-administered medications for this visit.    SURGICAL HISTORY:  Past Surgical History:  Procedure Laterality Date  . CHOLECYSTECTOMY    . COLONOSCOPY  2015   Results requested from [facility]  . COLONOSCOPY    . ESOPHAGOGASTRODUODENOSCOPY N/A 08/14/2015   Procedure: ESOPHAGOGASTRODUODENOSCOPY (EGD);  Surgeon: Daneil Dolin, MD;  [facility]:  [facility];  Service: Endoscopy;  Laterality: N/A;  215   . ESOPHAGOGASTRODUODENOSCOPY (EGD) WITH PROPOFOL N/A 09/13/2015   Procedure: ESOPHAGOGASTRODUODENOSCOPY (EGD) WITH PROPOFOL;  Surgeon: Milus Banister, MD;  Location: [facility];  Service: Endoscopy;  Laterality: N/A;  . EUS N/A 03/12/2017   Procedure: UPPER ENDOSCOPIC ULTRASOUND (EUS) RADIAL;  Surgeon: Milus Banister, MD;  Location: [facility];  Service: Endoscopy;  Laterality: N/A;  . TUMOR REMOVAL  2005   Benign  . UPPER ESOPHAGEAL ENDOSCOPIC ULTRASOUND (EUS)  09/13/2015   Procedure: UPPER ESOPHAGEAL ENDOSCOPIC ULTRASOUND (EUS);  Surgeon: Milus Banister, MD;  Location: [facility];  Service: Endoscopy;;  . VIDEO BRONCHOSCOPY WITH ENDOBRONCHIAL NAVIGATION N/A 12/31/2015   Procedure: VIDEO BRONCHOSCOPY WITH ENDOBRONCHIAL NAVIGATION;  Surgeon: Melrose Nakayama, MD;  Location: [facility];  Service: Thoracic;  Laterality: N/A;  . VIDEO BRONCHOSCOPY WITH ENDOBRONCHIAL ULTRASOUND N/A 11/08/2015   Procedure: VIDEO BRONCHOSCOPY WITH ENDOBRONCHIAL ULTRASOUND;  Surgeon: Ivin Poot, MD;  Location: [facility] OR;  Service: Thoracic;  Laterality: N/A;    REVIEW OF SYSTEMS:   Review of Systems  Constitutional: Negative for appetite change, chills, fatigue, fever and unexpected weight change.  HENT:   Negative for mouth sores, nosebleeds, sore throat and trouble swallowing.   Eyes: Negative for eye problems and icterus.  Respiratory: Negative for cough, hemoptysis, shortness of breath and wheezing.   Cardiovascular: Negative for chest pain and leg swelling.  Gastrointestinal: Negative for abdominal pain, constipation, diarrhea, nausea and vomiting.  Genitourinary: Negative for bladder incontinence, difficulty urinating, dysuria, frequency and hematuria.   Musculoskeletal: Negative for back pain, gait problem, neck pain and neck stiffness.  Skin: Negative for itching and rash.  Neurological: Negative for dizziness, extremity weakness, gait  problem, headaches, light-headedness and seizures.  Hematological: Negative for adenopathy. Does not bruise/bleed easily.  Psychiatric/Behavioral: Negative for confusion, depression and sleep disturbance. The patient is not nervous/anxious.     PHYSICAL EXAMINATION:  There were no vitals taken for this visit.  ECOG PERFORMANCE STATUS: {CHL ONC ECOG [id number]  Physical Exam  Constitutional: Oriented to person, place, and time and well-developed, well-nourished, and in no distress. No distress.  HENT:  Head: Normocephalic and atraumatic.  Mouth/Throat: Oropharynx is clear and moist. No oropharyngeal exudate.  Eyes: Conjunctivae are normal. Right eye exhibits no discharge. Left eye exhibits no discharge. No scleral icterus.  Neck: Normal range of motion. Neck supple.  Cardiovascular: Normal rate, regular rhythm, normal heart sounds and intact distal pulses.   Pulmonary/Chest: Effort normal and breath sounds normal. No respiratory distress. No wheezes. No rales.  Abdominal: Soft. Bowel sounds are normal. Exhibits no distension and no mass. There is no tenderness.  Musculoskeletal: Normal range of motion. Exhibits no edema.  Lymphadenopathy:    No cervical adenopathy.  Neurological: Alert and oriented to person, place, and time. Exhibits normal muscle tone. Gait normal. Coordination normal.  Skin: Skin is warm and dry. No rash noted. Not diaphoretic. No erythema. No pallor.  Psychiatric: Mood, memory and judgment normal.  Vitals reviewed.  LABORATORY DATA: Lab Results  Component Value Date   WBC 7.4 10/06/2019   HGB 12.6 10/06/2019   HCT 36.9 10/06/2019   MCV 85.8 10/06/2019   PLT 246 10/06/2019      Chemistry      Component Value Date/Time   NA 145 10/06/2019 0822   NA 144 04/09/2017 0948   K 3.8 10/06/2019 0822   K 4.3 04/09/2017 0948   CL 106 10/06/2019 0822   CO2 30 10/06/2019 0822   CO2 25 04/09/2017 0948   BUN 20 10/06/2019 0822   BUN 16.8 04/09/2017 0948    CREATININE 0.64 10/06/2019 0822   CREATININE 0.8 04/09/2017 0948      Component Value Date/Time   CALCIUM 9.3 10/06/2019 0822   CALCIUM 9.1 04/09/2017 0948   ALKPHOS 210 (H) 10/06/2019 0822   ALKPHOS 216 (H) 04/09/2017 0948   AST 24 10/06/2019 0822   AST 12 04/09/2017 0948   ALT 25 10/06/2019 0822   ALT 13 04/09/2017 0948   BILITOT <0.2 (L) 10/06/2019 0822   BILITOT 0.22 04/09/2017 0948       RADIOGRAPHIC STUDIES:  DG Chest 1 View  Result Date: 10/03/2019 CLINICAL DATA:  Cough and shortness of breath cough, congestion EXAM: CHEST  1 VIEW COMPARISON:  06/15/2019 FINDINGS: The  lungs are hyperinflated with diffuse interstitial prominence. No focal airspace consolidation or pulmonary edema. No pleural effusion or pneumothorax. Normal cardiomediastinal contours. Bibasilar scarring is unchanged IMPRESSION: COPD without acute airspace disease. Electronically Signed   By: Ulyses Jarred M.D.   On: 10/03/2019 20:21   CT Chest W Contrast  Result Date: 10/11/2019 CLINICAL DATA:  Non-small cell lung cancer restaging EXAM: CT CHEST, ABDOMEN, AND PELVIS WITH CONTRAST TECHNIQUE: Multidetector CT imaging of the chest, abdomen and pelvis was performed following the standard protocol during bolus administration of intravenous contrast. CONTRAST:  160mL OMNIPAQUE IOHEXOL 300 MG/ML SOLN, additional oral enteric contrast COMPARISON:  07/05/2019, 04/03/2019 FINDINGS: CT CHEST FINDINGS Cardiovascular: Aortic atherosclerosis. Normal heart size. Three-vessel coronary artery calcifications. No pericardial effusion. Mediastinum/Nodes: No enlarged mediastinal, hilar, or axillary lymph nodes. Thyroid gland, trachea, and esophagus demonstrate no significant findings. Lungs/Pleura: Severe centrilobular emphysema. Interval increase in consolidation and volume loss of the right middle lobe, now with almost complete fibrotic consolidation (series 6, image 106). Bandlike scarring of the bilateral lung bases. No pleural  effusion or pneumothorax. Musculoskeletal: No chest wall mass or suspicious bone lesions identified. CT ABDOMEN PELVIS FINDINGS Hepatobiliary: No focal liver abnormality is seen. Status post cholecystectomy. Postoperative biliary dilatation. Pancreas: Unremarkable. No pancreatic ductal dilatation or surrounding inflammatory changes. Spleen: Normal in size without significant abnormality. Adrenals/Urinary Tract: Adrenal glands are unremarkable. Kidneys are normal, without renal calculi, solid lesion, or hydronephrosis. Bladder is unremarkable. Stomach/Bowel: Stomach is within normal limits. Appendix appears normal. No evidence of bowel wall thickening, distention, or inflammatory changes. Sigmoid diverticulosis. Vascular/Lymphatic: Aortic atherosclerosis. No enlarged abdominal or pelvic lymph nodes. Reproductive: Calcified uterine fibroids. Other: No abdominal wall hernia or abnormality. No abdominopelvic ascites. Musculoskeletal: No acute or significant osseous findings. IMPRESSION: 1. Interval increase in consolidation and volume loss of the right middle lobe, consistent with continued treatment response and developing fibrosis. 2. No evidence of recurrent or metastatic disease within the chest, abdomen, or pelvis. 3. Emphysema (ICD10-J43.9). 4. Coronary artery disease. Aortic Atherosclerosis (ICD10-I70.0). Electronically Signed   By: Eddie Candle M.D.   On: 10/11/2019 09:26   CT Abdomen Pelvis W Contrast  Result Date: 10/11/2019 CLINICAL DATA:  Non-small cell lung cancer restaging EXAM: CT CHEST, ABDOMEN, AND PELVIS WITH CONTRAST TECHNIQUE: Multidetector CT imaging of the chest, abdomen and pelvis was performed following the standard protocol during bolus administration of intravenous contrast. CONTRAST:  128mL OMNIPAQUE IOHEXOL 300 MG/ML SOLN, additional oral enteric contrast COMPARISON:  07/05/2019, 04/03/2019 FINDINGS: CT CHEST FINDINGS Cardiovascular: Aortic atherosclerosis. Normal heart size. Three-vessel  coronary artery calcifications. No pericardial effusion. Mediastinum/Nodes: No enlarged mediastinal, hilar, or axillary lymph nodes. Thyroid gland, trachea, and esophagus demonstrate no significant findings. Lungs/Pleura: Severe centrilobular emphysema. Interval increase in consolidation and volume loss of the right middle lobe, now with almost complete fibrotic consolidation (series 6, image 106). Bandlike scarring of the bilateral lung bases. No pleural effusion or pneumothorax. Musculoskeletal: No chest wall mass or suspicious bone lesions identified. CT ABDOMEN PELVIS FINDINGS Hepatobiliary: No focal liver abnormality is seen. Status post cholecystectomy. Postoperative biliary dilatation. Pancreas: Unremarkable. No pancreatic ductal dilatation or surrounding inflammatory changes. Spleen: Normal in size without significant abnormality. Adrenals/Urinary Tract: Adrenal glands are unremarkable. Kidneys are normal, without renal calculi, solid lesion, or hydronephrosis. Bladder is unremarkable. Stomach/Bowel: Stomach is within normal limits. Appendix appears normal. No evidence of bowel wall thickening, distention, or inflammatory changes. Sigmoid diverticulosis. Vascular/Lymphatic: Aortic atherosclerosis. No enlarged abdominal or pelvic lymph nodes. Reproductive: Calcified uterine fibroids. Other: No abdominal wall  hernia or abnormality. No abdominopelvic ascites. Musculoskeletal: No acute or significant osseous findings. IMPRESSION: 1. Interval increase in consolidation and volume loss of the right middle lobe, consistent with continued treatment response and developing fibrosis. 2. No evidence of recurrent or metastatic disease within the chest, abdomen, or pelvis. 3. Emphysema (ICD10-J43.9). 4. Coronary artery disease. Aortic Atherosclerosis (ICD10-I70.0). Electronically Signed   By: Eddie Candle M.D.   On: 10/11/2019 09:26     ASSESSMENT/PLAN:  No problem-specific Assessment & Plan notes found for this  encounter.   No orders of the defined types were placed in this encounter.    Macdonald Rigor L Tangela Dolliver, PA-C 10/12/19

## 2019-10-13 ENCOUNTER — Ambulatory Visit: Payer: Medicare HMO

## 2019-10-13 ENCOUNTER — Other Ambulatory Visit: Payer: Medicare HMO

## 2019-10-13 ENCOUNTER — Ambulatory Visit: Payer: Medicare HMO | Admitting: Physician Assistant

## 2019-10-18 ENCOUNTER — Telehealth: Payer: Self-pay | Admitting: Internal Medicine

## 2019-10-18 NOTE — Telephone Encounter (Signed)
Called pt to confirm apt on 7/8 - she is aware and will have to double check with transportation. If it does not work she will give [place] a call back

## 2019-10-19 ENCOUNTER — Other Ambulatory Visit: Payer: Self-pay | Admitting: Physician Assistant

## 2019-10-19 DIAGNOSIS — C3491 Malignant neoplasm of unspecified part of right bronchus or lung: Secondary | ICD-10-CM

## 2019-10-19 NOTE — Progress Notes (Deleted)
[place] OFFICE PROGRESS NOTE  Barry Dienes, NP No address on file  DIAGNOSIS: Recurrent non-small cell lung cancer initially diagnosed as stage IIIA (T1b, N2, M0) non-small cell lung cancer presented with right middle lobe pulmonary nodule, mediastinal lymphadenopathy and highly suspicious for small nodule in the left upper lobe that could change her stage to stage IV that could present another synchronous primary lesion in the left upper lobe. This was diagnosed in September 2017.  PRIOR THERAPY: 1) Concurrent chemoradiation with weekly carboplatin for AUC of 2 and paclitaxel 45 MG/M2 status post 6 cycles last dose was given 02/25/2016 with partial response. 2) Consolidation chemotherapy with carboplatin for AUC of 5 and paclitaxel 175 MG/M2 every 3 weeks with Neulasta support. First dose 04/22/2016. Status post 3 cycles  CURRENT THERAPY: Second line treatment with immunotherapy with Nivolumab 480 mg IV every 4 weeks status post 31cycles  INTERVAL HISTORY: Courtney Zuniga 69 y.o. female returns to the clinic for a follow up visit. The patient's treatment has been delayed secondary to a COPD exacerbation. She completed a prednisone taper on __. Today, she is feeling __. The patient tolerated her last cycle of immunotherapy with Nivolumab well without any adverse effects. Denies any hemoptysis. Denies any nausea, vomiting, or diarrhea. She reports her baseline constipation. She experiences a chronic headaches.She had a history of a brain tumor in 2005. Denies any rashes or skin changes. She had a restaging CT scan. The patient is here today for evaluation prior to starting cycle # 32  MEDICAL HISTORY: Past Medical History:  Diagnosis Date  . Anemia    as a young woman  . Arthritis    osteoartritis  . Asthma   . Brain tumor (benign) ([place]) 2005 [facility]   Benign  . Chronic headaches   . Chronic hip pain   . Chronic pain   . COPD (chronic obstructive pulmonary disease)  ([place])   . Coronary artery disease   . Depression   . Depression 05/15/2016  . Encounter for antineoplastic chemotherapy 01/10/2016  . GERD (gastroesophageal reflux disease)   . Hypertension   . Lung cancer ([place]) dx'd 01/2016   currently on chemo and radiation   . NSTEMI (non-ST elevated myocardial infarction) ([place]) yrs ago  . On home O2    qhs 2 liters at hs and prn  . Pneumonia last time 2 yrs ago  . Shortness of breath dyspnea    with activity    ALLERGIES:  has No Known Allergies.  MEDICATIONS:  Current Outpatient Medications  Medication Sig Dispense Refill  . acetaminophen (TYLENOL) 325 MG tablet Take 2 tablets (650 mg total) by mouth every 6 (six) hours as needed for mild pain, fever or headache. (Patient not taking: Reported on 05/12/2019) 12 tablet 2  . albuterol (PROVENTIL HFA;VENTOLIN HFA) 108 (90 BASE) MCG/ACT inhaler Inhale 2 puffs into the lungs every 4 (four) hours as needed for shortness of breath.     Marland Kitchen BREO ELLIPTA 100-25 MCG/INH AEPB Inhale 1 puff into the lungs daily.     . Cholecalciferol (VITAMIN D3) 125 MCG (5000 UT) CAPS Take 1 capsule by mouth daily.  0  . cyclobenzaprine (FLEXERIL) 10 MG tablet Take 1 tablet (10 mg total) by mouth 2 (two) times daily. *MAy take one additional tablet as needed for muscle spasms 60 tablet 2  . dextromethorphan-guaiFENesin (MUCINEX DM) 30-600 MG 12hr tablet Take 1 tablet by mouth 2 (two) times daily. 40 tablet 0  . diclofenac sodium (VOLTAREN) 1 %  GEL Apply topically 4 (four) times daily as needed (massge gel into affected area(s) as needed for pain).   1  . doxycycline (VIBRAMYCIN) 100 MG capsule Take 1 capsule (100 mg total) by mouth 2 (two) times daily. 20 capsule 0  . ENSURE (ENSURE) Take 237 mLs by mouth 3 (three) times daily between meals.    Marland Kitchen ipratropium-albuterol (DUONEB) 0.5-2.5 (3) MG/3ML SOLN Take 3 mLs by nebulization 3 (three) times daily. 360 mL 2  . meloxicam (MOBIC) 15 MG tablet Take 15 mg by mouth daily.    .  methocarbamol (ROBAXIN) 500 MG tablet Take 1 tablet (500 mg total) by mouth every 8 (eight) hours as needed for muscle spasms. 30 tablet 0  . metoprolol tartrate (LOPRESSOR) 25 MG tablet Take 1 tablet (25 mg total) by mouth 2 (two) times daily. 60 tablet 1  . nicotine (NICODERM CQ - DOSED IN MG/24 HOURS) 14 mg/24hr patch Place 1 patch (14 mg total) onto the skin daily. 28 patch 0  . oxyCODONE-acetaminophen (PERCOCET/ROXICET) 5-325 MG tablet Take 1 tablet by mouth every 8 (eight) hours as needed for moderate pain. (Patient not taking: Reported on 05/12/2019) 12 tablet 0  . pantoprazole (PROTONIX) 40 MG tablet Take 1 tablet (40 mg total) by mouth daily. 30 tablet 3  . predniSONE (DELTASONE) 20 MG tablet Take 3 po QD x 3d , then 2 po QD x 3d then 1 po QD x 3d 18 tablet 0  . ROBITUSSIN 12 HOUR COUGH 30 MG/5ML liquid Take 30 mg by mouth every 4 (four) hours as needed for cough.     . topiramate (TOPAMAX) 25 MG tablet Take 1 tablet by mouth daily at bedtime X 1 week; then increase to 25mg  BID. 45 tablet 2   No current facility-administered medications for this visit.    SURGICAL HISTORY:  Past Surgical History:  Procedure Laterality Date  . CHOLECYSTECTOMY    . COLONOSCOPY  2015   Results requested from [facility]  . COLONOSCOPY    . ESOPHAGOGASTRODUODENOSCOPY N/A 08/14/2015   Procedure: ESOPHAGOGASTRODUODENOSCOPY (EGD);  Surgeon: Daneil Dolin, MD;  Location: [facility];  Service: Endoscopy;  Laterality: N/A;  215   . ESOPHAGOGASTRODUODENOSCOPY (EGD) WITH PROPOFOL N/A 09/13/2015   Procedure: ESOPHAGOGASTRODUODENOSCOPY (EGD) WITH PROPOFOL;  Surgeon: Milus Banister, MD;  Location: [facility];  Service: Endoscopy;  Laterality: N/A;  . EUS N/A 03/12/2017   Procedure: UPPER ENDOSCOPIC ULTRASOUND (EUS) RADIAL;  Surgeon: Milus Banister, MD;  Location: [facility];  Service: Endoscopy;  Laterality: N/A;  . TUMOR REMOVAL  2005   Benign  . UPPER ESOPHAGEAL ENDOSCOPIC ULTRASOUND (EUS)   09/13/2015   Procedure: UPPER ESOPHAGEAL ENDOSCOPIC ULTRASOUND (EUS);  Surgeon: Milus Banister, MD;  Location: [facility];  Service: Endoscopy;;  . VIDEO BRONCHOSCOPY WITH ENDOBRONCHIAL NAVIGATION N/A 12/31/2015   Procedure: VIDEO BRONCHOSCOPY WITH ENDOBRONCHIAL NAVIGATION;  Surgeon: Melrose Nakayama, MD;  Location: [facility];  Service: Thoracic;  Laterality: N/A;  . VIDEO BRONCHOSCOPY WITH ENDOBRONCHIAL ULTRASOUND N/A 11/08/2015   Procedure: VIDEO BRONCHOSCOPY WITH ENDOBRONCHIAL ULTRASOUND;  Surgeon: Ivin Poot, MD;  Location: [facility] OR;  Service: Thoracic;  Laterality: N/A;    REVIEW OF SYSTEMS:   Review of Systems  Constitutional: Negative for appetite change, chills, fatigue, fever and unexpected weight change.  HENT:   Negative for mouth sores, nosebleeds, sore throat and trouble swallowing.   Eyes: Negative for eye problems and icterus.  Respiratory: Negative for cough, hemoptysis, shortness of breath and wheezing.  Cardiovascular: Negative for chest pain and leg swelling.  Gastrointestinal: Negative for abdominal pain, constipation, diarrhea, nausea and vomiting.  Genitourinary: Negative for bladder incontinence, difficulty urinating, dysuria, frequency and hematuria.   Musculoskeletal: Negative for back pain, gait problem, neck pain and neck stiffness.  Skin: Negative for itching and rash.  Neurological: Negative for dizziness, extremity weakness, gait problem, headaches, light-headedness and seizures.  Hematological: Negative for adenopathy. Does not bruise/bleed easily.  Psychiatric/Behavioral: Negative for confusion, depression and sleep disturbance. The patient is not nervous/anxious.     PHYSICAL EXAMINATION:  There were no vitals taken for this visit.  ECOG PERFORMANCE STATUS: {CHL ONC ECOG [id number]  Physical Exam  Constitutional: Oriented to person, place, and time and well-developed, well-nourished, and in no distress. No distress.  HENT:  Head: Normocephalic  and atraumatic.  Mouth/Throat: Oropharynx is clear and moist. No oropharyngeal exudate.  Eyes: Conjunctivae are normal. Right eye exhibits no discharge. Left eye exhibits no discharge. No scleral icterus.  Neck: Normal range of motion. Neck supple.  Cardiovascular: Normal rate, regular rhythm, normal heart sounds and intact distal pulses.   Pulmonary/Chest: Effort normal and breath sounds normal. No respiratory distress. No wheezes. No rales.  Abdominal: Soft. Bowel sounds are normal. Exhibits no distension and no mass. There is no tenderness.  Musculoskeletal: Normal range of motion. Exhibits no edema.  Lymphadenopathy:    No cervical adenopathy.  Neurological: Alert and oriented to person, place, and time. Exhibits normal muscle tone. Gait normal. Coordination normal.  Skin: Skin is warm and dry. No rash noted. Not diaphoretic. No erythema. No pallor.  Psychiatric: Mood, memory and judgment normal.  Vitals reviewed.  LABORATORY DATA: Lab Results  Component Value Date   WBC 7.4 10/06/2019   HGB 12.6 10/06/2019   HCT 36.9 10/06/2019   MCV 85.8 10/06/2019   PLT 246 10/06/2019      Chemistry      Component Value Date/Time   NA 145 10/06/2019 0822   NA 144 04/09/2017 0948   K 3.8 10/06/2019 0822   K 4.3 04/09/2017 0948   CL 106 10/06/2019 0822   CO2 30 10/06/2019 0822   CO2 25 04/09/2017 0948   BUN 20 10/06/2019 0822   BUN 16.8 04/09/2017 0948   CREATININE 0.64 10/06/2019 0822   CREATININE 0.8 04/09/2017 0948      Component Value Date/Time   CALCIUM 9.3 10/06/2019 0822   CALCIUM 9.1 04/09/2017 0948   ALKPHOS 210 (H) 10/06/2019 0822   ALKPHOS 216 (H) 04/09/2017 0948   AST 24 10/06/2019 0822   AST 12 04/09/2017 0948   ALT 25 10/06/2019 0822   ALT 13 04/09/2017 0948   BILITOT <0.2 (L) 10/06/2019 0822   BILITOT 0.22 04/09/2017 0948       RADIOGRAPHIC STUDIES:  DG Chest 1 View  Result Date: 10/03/2019 CLINICAL DATA:  Cough and shortness of breath cough, congestion  EXAM: CHEST  1 VIEW COMPARISON:  06/15/2019 FINDINGS: The lungs are hyperinflated with diffuse interstitial prominence. No focal airspace consolidation or pulmonary edema. No pleural effusion or pneumothorax. Normal cardiomediastinal contours. Bibasilar scarring is unchanged IMPRESSION: COPD without acute airspace disease. Electronically Signed   By: Ulyses Jarred M.D.   On: 10/03/2019 20:21   CT Chest W Contrast  Result Date: 10/11/2019 CLINICAL DATA:  Non-small cell lung cancer restaging EXAM: CT CHEST, ABDOMEN, AND PELVIS WITH CONTRAST TECHNIQUE: Multidetector CT imaging of the chest, abdomen and pelvis was performed following the standard protocol during bolus administration of intravenous contrast. CONTRAST:  165mL OMNIPAQUE IOHEXOL 300 MG/ML SOLN, additional oral enteric contrast COMPARISON:  07/05/2019, 04/03/2019 FINDINGS: CT CHEST FINDINGS Cardiovascular: Aortic atherosclerosis. Normal heart size. Three-vessel coronary artery calcifications. No pericardial effusion. Mediastinum/Nodes: No enlarged mediastinal, hilar, or axillary lymph nodes. Thyroid gland, trachea, and esophagus demonstrate no significant findings. Lungs/Pleura: Severe centrilobular emphysema. Interval increase in consolidation and volume loss of the right middle lobe, now with almost complete fibrotic consolidation (series 6, image 106). Bandlike scarring of the bilateral lung bases. No pleural effusion or pneumothorax. Musculoskeletal: No chest wall mass or suspicious bone lesions identified. CT ABDOMEN PELVIS FINDINGS Hepatobiliary: No focal liver abnormality is seen. Status post cholecystectomy. Postoperative biliary dilatation. Pancreas: Unremarkable. No pancreatic ductal dilatation or surrounding inflammatory changes. Spleen: Normal in size without significant abnormality. Adrenals/Urinary Tract: Adrenal glands are unremarkable. Kidneys are normal, without renal calculi, solid lesion, or hydronephrosis. Bladder is unremarkable.  Stomach/Bowel: Stomach is within normal limits. Appendix appears normal. No evidence of bowel wall thickening, distention, or inflammatory changes. Sigmoid diverticulosis. Vascular/Lymphatic: Aortic atherosclerosis. No enlarged abdominal or pelvic lymph nodes. Reproductive: Calcified uterine fibroids. Other: No abdominal wall hernia or abnormality. No abdominopelvic ascites. Musculoskeletal: No acute or significant osseous findings. IMPRESSION: 1. Interval increase in consolidation and volume loss of the right middle lobe, consistent with continued treatment response and developing fibrosis. 2. No evidence of recurrent or metastatic disease within the chest, abdomen, or pelvis. 3. Emphysema (ICD10-J43.9). 4. Coronary artery disease. Aortic Atherosclerosis (ICD10-I70.0). Electronically Signed   By: Eddie Candle M.D.   On: 10/11/2019 09:26   CT Abdomen Pelvis W Contrast  Result Date: 10/11/2019 CLINICAL DATA:  Non-small cell lung cancer restaging EXAM: CT CHEST, ABDOMEN, AND PELVIS WITH CONTRAST TECHNIQUE: Multidetector CT imaging of the chest, abdomen and pelvis was performed following the standard protocol during bolus administration of intravenous contrast. CONTRAST:  117mL OMNIPAQUE IOHEXOL 300 MG/ML SOLN, additional oral enteric contrast COMPARISON:  07/05/2019, 04/03/2019 FINDINGS: CT CHEST FINDINGS Cardiovascular: Aortic atherosclerosis. Normal heart size. Three-vessel coronary artery calcifications. No pericardial effusion. Mediastinum/Nodes: No enlarged mediastinal, hilar, or axillary lymph nodes. Thyroid gland, trachea, and esophagus demonstrate no significant findings. Lungs/Pleura: Severe centrilobular emphysema. Interval increase in consolidation and volume loss of the right middle lobe, now with almost complete fibrotic consolidation (series 6, image 106). Bandlike scarring of the bilateral lung bases. No pleural effusion or pneumothorax. Musculoskeletal: No chest wall mass or suspicious bone lesions  identified. CT ABDOMEN PELVIS FINDINGS Hepatobiliary: No focal liver abnormality is seen. Status post cholecystectomy. Postoperative biliary dilatation. Pancreas: Unremarkable. No pancreatic ductal dilatation or surrounding inflammatory changes. Spleen: Normal in size without significant abnormality. Adrenals/Urinary Tract: Adrenal glands are unremarkable. Kidneys are normal, without renal calculi, solid lesion, or hydronephrosis. Bladder is unremarkable. Stomach/Bowel: Stomach is within normal limits. Appendix appears normal. No evidence of bowel wall thickening, distention, or inflammatory changes. Sigmoid diverticulosis. Vascular/Lymphatic: Aortic atherosclerosis. No enlarged abdominal or pelvic lymph nodes. Reproductive: Calcified uterine fibroids. Other: No abdominal wall hernia or abnormality. No abdominopelvic ascites. Musculoskeletal: No acute or significant osseous findings. IMPRESSION: 1. Interval increase in consolidation and volume loss of the right middle lobe, consistent with continued treatment response and developing fibrosis. 2. No evidence of recurrent or metastatic disease within the chest, abdomen, or pelvis. 3. Emphysema (ICD10-J43.9). 4. Coronary artery disease. Aortic Atherosclerosis (ICD10-I70.0). Electronically Signed   By: Eddie Candle M.D.   On: 10/11/2019 09:26     ASSESSMENT/PLAN:  This is a very pleasant65 year old African-American female with recurrent metastatic non-small cell lung cancer, adenocarcinoma of the  right middle lobe. She was initially diagnosed as stage IIIa in September 2017.   She completed concurrent chemoradiation followed by consolidation with carboplatin and paclitaxel. She is status post3 cycles. The patient was on observation but a restagingCTscan showed evidence of disease progression.  Sheis currently undergoing treatment withsecond line with immunotherapy with nivolumab 480 mg IV every 4 weeks. She is status post31cycles.  The patient  was seen with Dr. Julien Nordmann today. Dr. Julien Nordmann personally and independently reviewed the scan and discussed the results with the patient. The scan showed __.   Dr. Julien Nordmann recommends that she continue on the same treatment at the same dose.   We will see her back for a follow up visit in 4 weeks for evaluation before starting cycle #33.   The patient was advised to call immediately if she has any concerning symptoms in the interval. The patient voices understanding of current disease status and treatment options and is in agreement with the current care plan. All questions were answered. The patient knows to call the clinic with any problems, questions or concerns. We can certainly see the patient much sooner if necessary   No orders of the defined types were placed in this encounter.    Ritu Gagliardo L Urho Rio, PA-C 10/19/19

## 2019-10-20 ENCOUNTER — Telehealth: Payer: Self-pay | Admitting: Medical Oncology

## 2019-10-20 ENCOUNTER — Encounter (HOSPITAL_COMMUNITY): Payer: Self-pay | Admitting: Internal Medicine

## 2019-10-20 ENCOUNTER — Emergency Department (HOSPITAL_COMMUNITY): Payer: Medicare HMO

## 2019-10-20 ENCOUNTER — Inpatient Hospital Stay: Payer: Medicare HMO

## 2019-10-20 ENCOUNTER — Inpatient Hospital Stay (HOSPITAL_COMMUNITY)
Admission: EM | Admit: 2019-10-20 | Discharge: 2019-10-23 | DRG: 190 | Disposition: A | Discharge status: Home / Self Care | Payer: Medicare HMO | Attending: Internal Medicine | Admitting: Internal Medicine

## 2019-10-20 ENCOUNTER — Other Ambulatory Visit: Payer: Self-pay

## 2019-10-20 ENCOUNTER — Inpatient Hospital Stay: Payer: Medicare HMO | Attending: Oncology | Admitting: Physician Assistant

## 2019-10-20 DIAGNOSIS — Z86011 Personal history of benign neoplasm of the brain: Secondary | ICD-10-CM | POA: Diagnosis not present

## 2019-10-20 DIAGNOSIS — Z9981 Dependence on supplemental oxygen: Secondary | ICD-10-CM

## 2019-10-20 DIAGNOSIS — I10 Essential (primary) hypertension: Secondary | ICD-10-CM | POA: Diagnosis present

## 2019-10-20 DIAGNOSIS — Z8041 Family history of malignant neoplasm of ovary: Secondary | ICD-10-CM

## 2019-10-20 DIAGNOSIS — Z808 Family history of malignant neoplasm of other organs or systems: Secondary | ICD-10-CM | POA: Diagnosis not present

## 2019-10-20 DIAGNOSIS — C3491 Malignant neoplasm of unspecified part of right bronchus or lung: Secondary | ICD-10-CM | POA: Diagnosis present

## 2019-10-20 DIAGNOSIS — J449 Chronic obstructive pulmonary disease, unspecified: Secondary | ICD-10-CM | POA: Diagnosis not present

## 2019-10-20 DIAGNOSIS — Z20822 Contact with and (suspected) exposure to covid-19: Secondary | ICD-10-CM | POA: Diagnosis present

## 2019-10-20 DIAGNOSIS — Z79899 Other long term (current) drug therapy: Secondary | ICD-10-CM

## 2019-10-20 DIAGNOSIS — Z801 Family history of malignant neoplasm of trachea, bronchus and lung: Secondary | ICD-10-CM

## 2019-10-20 DIAGNOSIS — Z8042 Family history of malignant neoplasm of prostate: Secondary | ICD-10-CM

## 2019-10-20 DIAGNOSIS — Z791 Long term (current) use of non-steroidal anti-inflammatories (NSAID): Secondary | ICD-10-CM | POA: Diagnosis not present

## 2019-10-20 DIAGNOSIS — Z87891 Personal history of nicotine dependence: Secondary | ICD-10-CM

## 2019-10-20 DIAGNOSIS — I252 Old myocardial infarction: Secondary | ICD-10-CM

## 2019-10-20 DIAGNOSIS — J9621 Acute and chronic respiratory failure with hypoxia: Secondary | ICD-10-CM | POA: Diagnosis present

## 2019-10-20 DIAGNOSIS — M1990 Unspecified osteoarthritis, unspecified site: Secondary | ICD-10-CM | POA: Diagnosis present

## 2019-10-20 DIAGNOSIS — J441 Chronic obstructive pulmonary disease with (acute) exacerbation: Secondary | ICD-10-CM | POA: Diagnosis present

## 2019-10-20 DIAGNOSIS — K219 Gastro-esophageal reflux disease without esophagitis: Secondary | ICD-10-CM | POA: Diagnosis present

## 2019-10-20 DIAGNOSIS — Z7951 Long term (current) use of inhaled steroids: Secondary | ICD-10-CM | POA: Diagnosis not present

## 2019-10-20 DIAGNOSIS — C799 Secondary malignant neoplasm of unspecified site: Secondary | ICD-10-CM

## 2019-10-20 LAB — CBC WITH DIFFERENTIAL/PLATELET
Abs Immature Granulocytes: 0.03 10*3/uL (ref 0.00–0.07)
Basophils Absolute: 0 10*3/uL (ref 0.0–0.1)
Basophils Relative: 0 %
Eosinophils Absolute: 0.1 10*3/uL (ref 0.0–0.5)
Eosinophils Relative: 1 %
HCT: 40.6 % (ref 36.0–46.0)
Hemoglobin: 13.9 g/dL (ref 12.0–15.0)
Immature Granulocytes: 0 %
Lymphocytes Relative: 17 %
Lymphs Abs: 1.6 10*3/uL (ref 0.7–4.0)
MCH: 29 pg (ref 26.0–34.0)
MCHC: 34.2 g/dL (ref 30.0–36.0)
MCV: 84.6 fL (ref 80.0–100.0)
Monocytes Absolute: 0.7 10*3/uL (ref 0.1–1.0)
Monocytes Relative: 7 %
Neutro Abs: 7.2 10*3/uL (ref 1.7–7.7)
Neutrophils Relative %: 75 %
Platelets: 265 10*3/uL (ref 150–400)
RBC: 4.8 MIL/uL (ref 3.87–5.11)
RDW: 15.9 % — ABNORMAL HIGH (ref 11.5–15.5)
WBC: 9.6 10*3/uL (ref 4.0–10.5)
nRBC: 0 % (ref 0.0–0.2)

## 2019-10-20 LAB — CBC
HCT: 41 % (ref 36.0–46.0)
Hemoglobin: 13.7 g/dL (ref 12.0–15.0)
MCH: 28.6 pg (ref 26.0–34.0)
MCHC: 33.4 g/dL (ref 30.0–36.0)
MCV: 85.6 fL (ref 80.0–100.0)
Platelets: 272 10*3/uL (ref 150–400)
RBC: 4.79 MIL/uL (ref 3.87–5.11)
RDW: 15.7 % — ABNORMAL HIGH (ref 11.5–15.5)
WBC: 5.3 10*3/uL (ref 4.0–10.5)
nRBC: 0 % (ref 0.0–0.2)

## 2019-10-20 LAB — COMPREHENSIVE METABOLIC PANEL
ALT: 15 U/L (ref 0–44)
AST: 28 U/L (ref 15–41)
Albumin: 3.9 g/dL (ref 3.5–5.0)
Alkaline Phosphatase: 203 U/L — ABNORMAL HIGH (ref 38–126)
Anion gap: 15 (ref 5–15)
BUN: 12 mg/dL (ref 8–23)
CO2: 27 mmol/L (ref 22–32)
Calcium: 9.9 mg/dL (ref 8.9–10.3)
Chloride: 103 mmol/L (ref 98–111)
Creatinine, Ser: 0.64 mg/dL (ref 0.44–1.00)
GFR calc Af Amer: >60 mL/min (ref >60)
GFR calc non Af Amer: >60 mL/min (ref >60)
Glucose, Bld: 101 mg/dL — ABNORMAL HIGH (ref 70–99)
Potassium: 4 mmol/L (ref 3.5–5.1)
Sodium: 145 mmol/L (ref 135–145)
Total Bilirubin: 0.4 mg/dL (ref 0.3–1.2)
Total Protein: 7.4 g/dL (ref 6.5–8.1)

## 2019-10-20 LAB — SARS CORONAVIRUS 2 BY RT PCR (HOSPITAL ORDER, PERFORMED IN ~~LOC~~ HOSPITAL LAB): SARS Coronavirus 2: NEGATIVE

## 2019-10-20 LAB — CREATININE, SERUM
Creatinine, Ser: 0.67 mg/dL (ref 0.44–1.00)
GFR calc Af Amer: >60 mL/min (ref >60)
GFR calc non Af Amer: >60 mL/min (ref >60)

## 2019-10-20 MED ORDER — METHOCARBAMOL 500 MG PO TABS
500.0000 mg | ORAL_TABLET | Freq: Three times a day (TID) | ORAL | Status: DC | PRN
  Administered 2019-10-20 – 2019-10-22 (×6): 500 mg via ORAL
  Filled 2019-10-20 (×6): qty 1

## 2019-10-20 MED ORDER — ENSURE ENLIVE PO LIQD
237.0000 mL | Freq: Three times a day (TID) | ORAL | Status: DC
  Administered 2019-10-20 – 2019-10-22 (×7): 237 mL via ORAL
  Filled 2019-10-20 (×2): qty 237

## 2019-10-20 MED ORDER — ALBUTEROL SULFATE (2.5 MG/3ML) 0.083% IN NEBU: 2.5000 mg | INHALATION_SOLUTION | Freq: Four times a day (QID) | RESPIRATORY_TRACT | Status: DC | PRN

## 2019-10-20 MED ORDER — AZITHROMYCIN 250 MG PO TABS
250.0000 mg | ORAL_TABLET | Freq: Every day | ORAL | Status: DC
  Administered 2019-10-21 – 2019-10-23 (×3): 250 mg via ORAL
  Filled 2019-10-20 (×3): qty 1

## 2019-10-20 MED ORDER — IPRATROPIUM-ALBUTEROL 0.5-2.5 (3) MG/3ML IN SOLN
3.0000 mL | Freq: Four times a day (QID) | RESPIRATORY_TRACT | Status: DC
  Administered 2019-10-20: 3 mL via RESPIRATORY_TRACT

## 2019-10-20 MED ORDER — ACETAMINOPHEN 325 MG PO TABS
650.0000 mg | ORAL_TABLET | Freq: Four times a day (QID) | ORAL | Status: DC | PRN
  Administered 2019-10-20 – 2019-10-22 (×7): 650 mg via ORAL
  Filled 2019-10-20 (×7): qty 2

## 2019-10-20 MED ORDER — METHYLPREDNISOLONE SODIUM SUCC 40 MG IJ SOLR
40.0000 mg | Freq: Four times a day (QID) | INTRAMUSCULAR | Status: DC
  Administered 2019-10-20 – 2019-10-23 (×11): 40 mg via INTRAVENOUS
  Filled 2019-10-20 (×11): qty 1

## 2019-10-20 MED ORDER — POLYETHYLENE GLYCOL 3350 17 G PO PACK
17.0000 g | PACK | Freq: Every day | ORAL | Status: DC | PRN
  Administered 2019-10-22: 17 g via ORAL
  Filled 2019-10-20: qty 1

## 2019-10-20 MED ORDER — AZITHROMYCIN 250 MG PO TABS
500.0000 mg | ORAL_TABLET | Freq: Every day | ORAL | Status: AC
  Administered 2019-10-20: 500 mg via ORAL
  Filled 2019-10-20: qty 2

## 2019-10-20 MED ORDER — SODIUM CHLORIDE 0.9% FLUSH
3.0000 mL | Freq: Two times a day (BID) | INTRAVENOUS | Status: DC
  Administered 2019-10-20 – 2019-10-22 (×5): 3 mL via INTRAVENOUS

## 2019-10-20 MED ORDER — ACETAMINOPHEN 650 MG RE SUPP: 650.0000 mg | Freq: Four times a day (QID) | RECTAL | Status: DC | PRN

## 2019-10-20 MED ORDER — ALBUTEROL SULFATE (2.5 MG/3ML) 0.083% IN NEBU
5.0000 mg | INHALATION_SOLUTION | Freq: Once | RESPIRATORY_TRACT | Status: AC
  Administered 2019-10-20: 5 mg via RESPIRATORY_TRACT
  Filled 2019-10-20: qty 6

## 2019-10-20 MED ORDER — SODIUM CHLORIDE 0.9 % IV SOLN: 250.0000 mL | INTRAVENOUS | Status: DC | PRN

## 2019-10-20 MED ORDER — NICOTINE 14 MG/24HR TD PT24
14.0000 mg | MEDICATED_PATCH | Freq: Every day | TRANSDERMAL | Status: DC
  Administered 2019-10-21 – 2019-10-23 (×3): 14 mg via TRANSDERMAL
  Filled 2019-10-20 (×3): qty 1

## 2019-10-20 MED ORDER — BISACODYL 10 MG RE SUPP: 10.0000 mg | Freq: Every day | RECTAL | Status: DC | PRN

## 2019-10-20 MED ORDER — IPRATROPIUM BROMIDE 0.02 % IN SOLN
0.5000 mg | Freq: Once | RESPIRATORY_TRACT | Status: AC
  Administered 2019-10-20: 0.5 mg via RESPIRATORY_TRACT
  Filled 2019-10-20: qty 2.5

## 2019-10-20 MED ORDER — ENOXAPARIN SODIUM 40 MG/0.4ML ~~LOC~~ SOLN
40.0000 mg | SUBCUTANEOUS | Status: DC
  Administered 2019-10-20 – 2019-10-22 (×3): 40 mg via SUBCUTANEOUS
  Filled 2019-10-20 (×3): qty 0.4

## 2019-10-20 MED ORDER — SODIUM CHLORIDE 0.9% FLUSH: 3.0000 mL | INTRAVENOUS | Status: DC | PRN

## 2019-10-20 MED ORDER — ALBUTEROL SULFATE (2.5 MG/3ML) 0.083% IN NEBU: 2.5000 mg | INHALATION_SOLUTION | Freq: Four times a day (QID) | RESPIRATORY_TRACT | Status: DC

## 2019-10-20 MED ORDER — PANTOPRAZOLE SODIUM 40 MG PO TBEC
40.0000 mg | DELAYED_RELEASE_TABLET | Freq: Every day | ORAL | Status: DC
  Administered 2019-10-21 – 2019-10-23 (×3): 40 mg via ORAL
  Filled 2019-10-20 (×3): qty 1

## 2019-10-20 MED ORDER — METHYLPREDNISOLONE SODIUM SUCC 125 MG IJ SOLR
125.0000 mg | Freq: Once | INTRAMUSCULAR | Status: AC
  Administered 2019-10-20: 125 mg via INTRAVENOUS
  Filled 2019-10-20: qty 2

## 2019-10-20 MED ORDER — IPRATROPIUM-ALBUTEROL 0.5-2.5 (3) MG/3ML IN SOLN
3.0000 mL | Freq: Four times a day (QID) | RESPIRATORY_TRACT | Status: DC
  Administered 2019-10-20 – 2019-10-22 (×6): 3 mL via RESPIRATORY_TRACT
  Filled 2019-10-20 (×6): qty 3

## 2019-10-20 MED ORDER — DICLOFENAC SODIUM 1 % TD GEL
2.0000 g | Freq: Four times a day (QID) | TRANSDERMAL | Status: DC | PRN
  Filled 2019-10-20: qty 100

## 2019-10-20 MED ORDER — FENTANYL CITRATE (PF) 100 MCG/2ML IJ SOLN
50.0000 ug | Freq: Once | INTRAMUSCULAR | Status: AC
  Administered 2019-10-20: 50 ug via INTRAVENOUS
  Filled 2019-10-20: qty 2

## 2019-10-20 NOTE — ED Provider Notes (Signed)
[facility] DEPT Provider Note   CSN: [id number] Arrival date & time: 10/20/19  6440     History No chief complaint on file.   Courtney Zuniga is a 69 y.o. female. Level 5 caveat due to dyspnea. HPI Patient presents with shortness of breath.  History of COPD.  Patient states she has never had to be admitted to hospital for it but records review shows it has required admission in the past.  Has had trouble breathing for around the last 2 to 3 days.  Some chest tightness.  In respiratory distress on a nonrebreather.  Also has stage III or IV lung cancer.    Past Medical History:  Diagnosis Date  . Anemia    as a young woman  . Arthritis    osteoartritis  . Asthma   . Brain tumor (benign) ([place]) 2005 [facility]   Benign  . Chronic headaches   . Chronic hip pain   . Chronic pain   . COPD (chronic obstructive pulmonary disease) ([place])   . Coronary artery disease   . Depression   . Depression 05/15/2016  . Encounter for antineoplastic chemotherapy 01/10/2016  . GERD (gastroesophageal reflux disease)   . Hypertension   . Lung cancer ([place]) dx'd 01/2016   currently on chemo and radiation   . NSTEMI (non-ST elevated myocardial infarction) ([place]) yrs ago  . On home O2    qhs 2 liters at hs and prn  . Pneumonia last time 2 yrs ago  . Shortness of breath dyspnea    with activity    Patient Active Problem List   Diagnosis Date Noted  . Chronic nonintractable headache   . Acute on chronic respiratory failure with hypoxia ([place]) 06/16/2018  . Influenza A 06/16/2018  . Severe protein-calorie malnutrition ([place]) 06/16/2018  . GERD (gastroesophageal reflux disease) 06/16/2018  . PNA (pneumonia) 05/05/2018  . Sepsis ([place]) 05/05/2018  . Chronic respiratory failure ([place]) 05/05/2018  . Encounter for antineoplastic immunotherapy 04/23/2017  . Depression 05/15/2016  . Adenocarcinoma of right lung, stage 3 ([place]) 01/10/2016  . Encounter for antineoplastic  chemotherapy 01/10/2016  . Lung mass 01/03/2016  . Arthritis   . Subepithelial gastric mass   . Mucosal abnormality of stomach   . Abdominal pain 08/08/2015  . Abnormal CT of the abdomen 08/08/2015  . Influenza with pneumonia 06/27/2014  . Hypoxia 06/26/2014  . COPD with acute exacerbation ([place]) 06/26/2014  . CAP (community acquired pneumonia)   . Chest pain, rule out acute myocardial infarction   . Asthma, chronic   . Chest pain 02/16/2014  . HTN (hypertension) 02/16/2014  . DDD (degenerative disc disease), lumbar 02/04/2011  . Lumbar herniated disc 01/09/2011    Past Surgical History:  Procedure Laterality Date  . CHOLECYSTECTOMY    . COLONOSCOPY  2015   [facility] from [facility]  . COLONOSCOPY    . ESOPHAGOGASTRODUODENOSCOPY N/A 08/14/2015   Procedure: ESOPHAGOGASTRODUODENOSCOPY (EGD);  Surgeon: Daneil Dolin, MD;  [facility];  Service: Endoscopy;  Laterality: N/A;  215   . ESOPHAGOGASTRODUODENOSCOPY (EGD) WITH PROPOFOL N/A 09/13/2015   Procedure: ESOPHAGOGASTRODUODENOSCOPY (EGD) WITH PROPOFOL;  Surgeon: Milus Banister, MD;  Location: [facility] ENDOS[facility];  Service: Endoscopy;  Laterality: N/A;  . EUS N/A 03/12/2017   Procedure: UPPER ENDOSCOPIC ULTRASOUND (EUS) RADIAL;  Surgeon: Milus Banister, MD;  Location: [facility] ENDOSCOPY;  Service: Endoscopy;  Laterality: N/A;  . TUMOR REMOVAL  2005   Benign  . UPPER ESOPHAGEAL ENDOSCOPIC ULTRASOUND (  EUS)  09/13/2015   Procedure: UPPER ESOPHAGEAL ENDOSCOPIC ULTRASOUND (EUS);  Surgeon: Milus Banister, MD;  Location: [facility];  Service: Endoscopy;;  . VIDEO BRONCHOSCOPY WITH ENDOBRONCHIAL NAVIGATION N/A 12/31/2015   Procedure: VIDEO BRONCHOSCOPY WITH ENDOBRONCHIAL NAVIGATION;  Surgeon: Melrose Nakayama, MD;  Location: [facility];  Service: Thoracic;  Laterality: N/A;  . VIDEO BRONCHOSCOPY WITH ENDOBRONCHIAL ULTRASOUND N/A 11/08/2015   Procedure: VIDEO BRONCHOSCOPY WITH ENDOBRONCHIAL ULTRASOUND;  Surgeon: Ivin Poot, MD;  Location: [facility] OR;  Service: Thoracic;  Laterality: N/A;     OB History   No obstetric history on file.     Family History  Problem Relation Age of Onset  . Ovarian cancer Mother   . Lung cancer Father   . Lung cancer Brother   . Lung cancer Brother   . Prostate cancer Brother   . Brain cancer Sister        Half sister    Social History   Tobacco Use  . Smoking status: Former Smoker    Packs/day: 1.00    Years: 38.00    Pack years: 38.00    Types: Cigarettes    Quit date: 07/26/2018    Years since quitting: 1.2  . Smokeless tobacco: Never Used  . Tobacco comment: Pt has asked doctor for med to help   Vaping Use  . Vaping Use: Never used  Substance Use Topics  . Alcohol use: No    Alcohol/week: 0.0 standard drinks  . Drug use: No    Home Medications Prior to Admission medications   Medication Sig Start Date End Date Taking? Authorizing Provider  acetaminophen (TYLENOL) 325 MG tablet Take 2 tablets (650 mg total) by mouth every 6 (six) hours as needed for mild pain, fever or headache. Patient not taking: Reported on 05/12/2019 05/08/18   Roxan Hockey, MD  albuterol (PROVENTIL HFA;VENTOLIN HFA) 108 (90 BASE) MCG/ACT inhaler Inhale 2 puffs into the lungs every 4 (four) hours as needed for shortness of breath.     [provider]  BREO ELLIPTA 100-25 MCG/INH AEPB Inhale 1 puff into the lungs daily.  04/30/17   [provider]  Cholecalciferol (VITAMIN D3) 125 MCG (5000 UT) CAPS Take 1 capsule by mouth daily. 02/17/18   [provider]  cyclobenzaprine (FLEXERIL) 10 MG tablet Take 1 tablet (10 mg total) by mouth 2 (two) times daily. *MAy take one additional tablet as needed for muscle spasms 05/08/18   Roxan Hockey, MD  dextromethorphan-guaiFENesin [facility] DM) 30-600 MG 12hr tablet Take 1 tablet by mouth 2 (two) times daily. 06/18/18   Barton Dubois, MD  diclofenac sodium (VOLTAREN) 1 % GEL Apply topically 4 (four) times daily as  needed (massge gel into affected area(s) as needed for pain).  08/11/17   [provider]  doxycycline (VIBRAMYCIN) 100 MG capsule Take 1 capsule (100 mg total) by mouth 2 (two) times daily. 10/04/19   Rolland Porter, MD  ENSURE (ENSURE) Take 237 mLs by mouth 3 (three) times daily between meals.    [provider]  ipratropium-albuterol (DUONEB) 0.5-2.5 (3) MG/3ML SOLN Take 3 mLs by nebulization 3 (three) times daily. 05/08/18   Roxan Hockey, MD  meloxicam (MOBIC) 15 MG tablet Take 15 mg by mouth daily. 02/17/19   [provider]  methocarbamol (ROBAXIN) 500 MG tablet Take 1 tablet (500 mg total) by mouth every 8 (eight) hours as needed for muscle spasms. 06/15/19   Maudie Flakes, MD  metoprolol tartrate (LOPRESSOR) 25 MG tablet  Take 1 tablet (25 mg total) by mouth 2 (two) times daily. 05/08/18   Roxan Hockey, MD  nicotine (NICODERM CQ - DOSED IN MG/24 HOURS) 14 mg/24hr patch Place 1 patch (14 mg total) onto the skin daily. 05/09/18   Roxan Hockey, MD  oxyCODONE-acetaminophen (PERCOCET/ROXICET) 5-325 MG tablet Take 1 tablet by mouth every 8 (eight) hours as needed for moderate pain. Patient not taking: Reported on 05/12/2019 05/08/18   Roxan Hockey, MD  pantoprazole (PROTONIX) 40 MG tablet Take 1 tablet (40 mg total) by mouth daily. 05/08/18   Roxan Hockey, MD  predniSONE (DELTASONE) 20 MG tablet Take 3 po QD x 3d , then 2 po QD x 3d then 1 po QD x 3d 10/04/19   Rolland Porter, MD  ROBITUSSIN 12 HOUR COUGH 30 MG/5ML liquid Take 30 mg by mouth every 4 (four) hours as needed for cough.  01/25/18   [provider]  topiramate (TOPAMAX) 25 MG tablet Take 1 tablet by mouth daily at bedtime X 1 week; then increase to 25mg  BID. 06/18/18   Barton Dubois, MD    Allergies    Patient has no known allergies.  Review of Systems   Review of Systems  Unable to perform ROS: Severe respiratory distress  Constitutional: Negative for appetite change.  Respiratory: Positive  for shortness of breath.     Physical Exam Updated Vital Signs BP (!) 151/98   Pulse (!) 110   Temp 98.3 F (36.8 C) (Oral)   Resp (!) 33   SpO2 98%   Physical Exam Vitals and nursing note reviewed.  HENT:     Head: Atraumatic.  Eyes:     Extraocular Movements: Extraocular movements intact.  Cardiovascular:     Rate and Rhythm: Tachycardia present.  Pulmonary:     Breath sounds: Wheezing present.     Comments: Tachypnea respiratory distress decreased air movement.  Wheezing. Abdominal:     Tenderness: There is no abdominal tenderness.  Musculoskeletal:     Right lower leg: No edema.     Left lower leg: No edema.  Skin:    General: Skin is warm.     Capillary Refill: Capillary refill takes less than 2 seconds.  Neurological:     Mental Status: She is alert and oriented to person, place, and time.     ED Results / Procedures / Treatments   Labs (all labs ordered are listed, but only abnormal results are displayed) Labs Reviewed  CBC WITH DIFFERENTIAL/PLATELET - Abnormal; Notable for the following components:      Result Value   RDW 15.9 (*)    All other components within normal limits  COMPREHENSIVE METABOLIC PANEL - Abnormal; Notable for the following components:   Glucose, Bld 101 (*)    Alkaline Phosphatase 203 (*)    All other components within normal limits  SARS CORONAVIRUS 2 BY RT PCR [facility] ORDER, [place] [facility])    EKG EKG Interpretation  Date/Time:  Thursday October 20 2019 07:12:02 EDT Ventricular Rate:  122 PR Interval:    QRS Duration: 79 QT Interval:  322 QTC Calculation: 459 R Axis:   71 Text Interpretation: Sinus tachycardia Multiform ventricular premature complexes Aberrant conduction of SV complex(es) Anteroseptal infarct, age indeterminate Artifact in lead(s) II III aVL aVF V4 rate increased since last tracing Confirmed by Davonna Belling [id number]) on 10/20/2019 8:04:17 AM   Radiology DG Chest Portable 1  View  Result Date: 10/20/2019 CLINICAL DATA:  Shortness of breath. EXAM:  PORTABLE CHEST 1 VIEW COMPARISON:  CT 10/11/2019.  Chest x-ray 10/03/2019. FINDINGS: Mediastinum and hilar structures normal. Heart size normal. COPD. Mild bibasilar subsegmental atelectasis. Tiny left pleural effusion cannot be excluded. IMPRESSION: COPD. Mild bibasilar subsegmental atelectasis and or scarring. Tiny left pleural effusion cannot be excluded. Electronically Signed   By: Marcello Moores  Register   On: 10/20/2019 07:43    Procedures Procedures (including critical care time)  Medications Ordered in ED Medications  albuterol (PROVENTIL) (2.5 MG/3ML) 0.083% nebulizer solution 5 mg (5 mg Nebulization Given 10/20/19 0719)  ipratropium (ATROVENT) nebulizer solution 0.5 mg (0.5 mg Nebulization Given 10/20/19 0719)  methylPREDNISolone sodium succinate (SOLU-MEDROL) 125 mg/2 mL injection 125 mg (125 mg Intravenous Given 10/20/19 0930)    ED Course  I have reviewed the triage vital signs and the nursing notes.  Pertinent labs & imaging results that were available during my care of the patient were reviewed by me and considered in my medical decision making (see chart for details).    MDM Rules/Calculators/A&P                          Patient presents with shortness of breath.  History of COPD and metastatic lung cancer.  X-ray reassuring.  Feels somewhat better after breathing treatment.  Patient got steroid after initial refusal.  States she did not want to miss her chemotherapy.  However eventually was willing to take it.  After breathing treatments and steroids patient attempted to ambulate to the bathroom and sats went down to the 80s.  She is on baseline 2 L nasal cannula but with worsening dyspnea and continued wheezing I feels the patient would benefit from admission to the hospital. Final Clinical Impression(s) / ED Diagnoses Final diagnoses:  Chronic obstructive pulmonary disease, unspecified COPD type ([place])  Acute on  chronic respiratory failure with hypoxia [facility])  Metastatic malignant neoplasm, unspecified site [facility])    Rx / DC Orders ED Discharge Orders    None       Davonna Belling, MD 10/20/19 1221

## 2019-10-20 NOTE — ED Notes (Signed)
Transport called.

## 2019-10-20 NOTE — Telephone Encounter (Signed)
Pt in ED with respiratory distress seeing Dr  Alvino Chapel.

## 2019-10-20 NOTE — H&P (Signed)
History and PhysicalLemon Zuniga   HTD:[id number] DOB: [birth year] DOA: 10/20/2019  Referring MD/provider: Dr. Alvino Chapel PCP: Barry Dienes, NP   Patient coming from: Home  Chief Complaint: Shortness of breath, wheezing x3 days  History of Present Illness:   Courtney Zuniga is an 69 y.o. 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  Patient states that she was recently seen at [facility] for similar symptoms.  She was treated with oral prednisone and doxycycline as an outpatient.  She states she felt better while she was on the medication however within 3 days of stopping the prednisone patient again became short of breath and started wheezing.  Patient denies that she smokes anymore.  Patient denies any fevers or chills.  She does have a cough that is nonproductive that did not improve on the doxycycline.  No chest pain.    ED Course:  The patient was noted to be afebrile.  Lung exam at the time revealed poor air entry per physician report.  She was treated with inhaled bronchodilators and steroids with some improvement.  However patient became markedly short of breath with O2 saturations dropping to 87% with ambulation to the bathroom.  Patient is admitted for ongoing treatment of COPD exacerbation.  ROS:   ROS   Review of Systems: General: Denies fever, chills, malaise,  Endocrine: Denies heat/cold intolerance, polyuria or weight loss. Cardiovascular: Denies chest pain or palpitations GI: Denies nausea, vomiting, diarrhea or constipation GU: Denies dysuria, frequency or hematuria CNS: Denies HA, dizziness, confusion, new weakness or clumsiness. Blood/lymphatics: Denies easy bruising or bleeding Mood/affect: Denies anxiety/depression    Past Medical History:   Past Medical History:  Diagnosis Date  . Anemia    as a young  woman  . Arthritis    osteoartritis  . Asthma   . Brain tumor (benign) ([place]) 2005 [facility]   Benign  . Chronic headaches   . Chronic hip pain   . Chronic pain   . COPD (chronic obstructive pulmonary disease) ([place])   . Coronary artery disease   . Depression   . Depression 05/15/2016  . Encounter for antineoplastic chemotherapy 01/10/2016  . GERD (gastroesophageal reflux disease)   . Hypertension   . Lung cancer ([place]) dx'd 01/2016   currently on chemo and radiation   . NSTEMI (non-ST elevated myocardial infarction) ([place]) yrs ago  . On home O2    qhs 2 liters at hs and prn  . Pneumonia last time 2 yrs ago  . Shortness of breath dyspnea    with activity    Past Surgical History:   Past Surgical History:  Procedure Laterality Date  . CHOLECYSTECTOMY    . COLONOSCOPY  2015   Results requested from [facility]  . COLONOSCOPY    . ESOPHAGOGASTRODUODENOSCOPY N/A 08/14/2015   Procedure: ESOPHAGOGASTRODUODENOSCOPY (EGD);  Surgeon: Daneil Dolin, MD;  Location: [facility];  Service: Endoscopy;  Laterality: N/A;  215   . ESOPHAGOGASTRODUODENOSCOPY (EGD) WITH PROPOFOL N/A 09/13/2015   Procedure: ESOPHAGOGASTRODUODENOSCOPY (EGD) WITH PROPOFOL;  Surgeon: Milus Banister, MD;  Location: [facility] ENDOSCOPY;  Service: Endoscopy;  Laterality: N/A;  . EUS N/A 03/12/2017   Procedure: UPPER ENDOSCOPIC ULTRASOUND (EUS) RADIAL;  Surgeon: Milus Banister, MD;  Location: [facility] ENDOSCOPY;  Service: Endoscopy;  Laterality: N/A;  . TUMOR REMOVAL  2005   Benign  .  UPPER ESOPHAGEAL ENDOSCOPIC ULTRASOUND (EUS)  09/13/2015   Procedure: UPPER ESOPHAGEAL ENDOSCOPIC ULTRASOUND (EUS);  Surgeon: Milus Banister, MD;  Location: [facility];  Service: Endoscopy;;  . VIDEO BRONCHOSCOPY WITH ENDOBRONCHIAL NAVIGATION N/A 12/31/2015   Procedure: VIDEO BRONCHOSCOPY WITH ENDOBRONCHIAL NAVIGATION;  Surgeon: Melrose Nakayama, MD;  Location: [facility];  Service: Thoracic;  Laterality: N/A;  . VIDEO BRONCHOSCOPY WITH  ENDOBRONCHIAL ULTRASOUND N/A 11/08/2015   Procedure: VIDEO BRONCHOSCOPY WITH ENDOBRONCHIAL ULTRASOUND;  Surgeon: Ivin Poot, MD;  Location: [facility] OR;  Service: Thoracic;  Laterality: N/A;    Social History:   Social History   Socioeconomic History  . Marital status: Single    Spouse name: Not on file  . Number of children: Not on file  . Years of education: 10th grade  . Highest education level: Not on file  Occupational History  . Occupation: retired  Tobacco Use  . Smoking status: Former Smoker    Packs/day: 1.00    Years: 38.00    Pack years: 38.00    Types: Cigarettes    Quit date: 07/26/2018    Years since quitting: 1.2  . Smokeless tobacco: Never Used  . Tobacco comment: Pt has asked doctor for med to help   Vaping Use  . Vaping Use: Never used  Substance and Sexual Activity  . Alcohol use: No    Alcohol/week: 0.0 standard drinks  . Drug use: No  . Sexual activity: Not Currently  Other Topics Concern  . Not on file  Social History Narrative  . Not on file   Social Determinants of Health   Financial Resource Strain:   . Difficulty of Paying Living Expenses:   Food Insecurity:   . Worried About Charity fundraiser in the Last Year:   . Arboriculturist in the Last Year:   Transportation Needs:   . Film/video editor (Medical):   Marland Kitchen Lack of Transportation (Non-Medical):   Physical Activity:   . Days of Exercise per Week:   . Minutes of Exercise per Session:   Stress:   . Feeling of Stress :   Social Connections:   . Frequency of Communication with Friends and Family:   . Frequency of Social Gatherings with Friends and Family:   . Attends Religious Services:   . Active Member of Clubs or Organizations:   . Attends Archivist Meetings:   Marland Kitchen Marital Status:   Intimate Partner Violence:   . Fear of Current or Ex-Partner:   . Emotionally Abused:   Marland Kitchen Physically Abused:   . Sexually Abused:     Allergies   Patient has no known  allergies.  Family history:   Family History  Problem Relation Age of Onset  . Ovarian cancer Mother   . Lung cancer Father   . Lung cancer Brother   . Lung cancer Brother   . Prostate cancer Brother   . Brain cancer Sister        Half sister    Current Medications:   Prior to Admission medications   Medication Sig Start Date End Date Taking? Authorizing Provider  albuterol (PROVENTIL HFA;VENTOLIN HFA) 108 (90 BASE) MCG/ACT inhaler Inhale 2 puffs into the lungs every 4 (four) hours as needed for shortness of breath.    Yes [provider]  BREO ELLIPTA 100-25 MCG/INH AEPB Inhale 1 puff into the lungs daily.  04/30/17  Yes [provider]  Cholecalciferol (VITAMIN D3) 125 MCG (5000 UT) CAPS Take 1  capsule by mouth daily. 02/17/18  Yes [provider]  diclofenac sodium (VOLTAREN) 1 % GEL Apply topically 4 (four) times daily as needed (massge gel into affected area(s) as needed for pain).  08/11/17  Yes [provider]  ENSURE (ENSURE) Take 237 mLs by mouth 3 (three) times daily between meals.   Yes [provider]  ipratropium-albuterol (DUONEB) 0.5-2.5 (3) MG/3ML SOLN Take 3 mLs by nebulization 3 (three) times daily. 05/08/18  Yes Emokpae, Courage, MD  methocarbamol (ROBAXIN) 500 MG tablet Take 1 tablet (500 mg total) by mouth every 8 (eight) hours as needed for muscle spasms. 06/15/19  Yes Maudie Flakes, MD  metoprolol tartrate (LOPRESSOR) 25 MG tablet Take 1 tablet (25 mg total) by mouth 2 (two) times daily. 05/08/18  Yes Emokpae, Courage, MD  nicotine (NICODERM CQ - DOSED IN MG/24 HOURS) 14 mg/24hr patch Place 1 patch (14 mg total) onto the skin daily. 05/09/18  Yes Emokpae, Courage, MD  pantoprazole (PROTONIX) 40 MG tablet Take 1 tablet (40 mg total) by mouth daily. 05/08/18  Yes Emokpae, Courage, MD  ROBITUSSIN 12 HOUR COUGH 30 MG/5ML liquid Take 30 mg by mouth every 4 (four) hours as needed for cough.  01/25/18  Yes [provider]   acetaminophen (TYLENOL) 325 MG tablet Take 2 tablets (650 mg total) by mouth every 6 (six) hours as needed for mild pain, fever or headache. Patient not taking: Reported on 05/12/2019 05/08/18   Roxan Hockey, MD  cyclobenzaprine (FLEXERIL) 10 MG tablet Take 1 tablet (10 mg total) by mouth 2 (two) times daily. *MAy take one additional tablet as needed for muscle spasms Patient not taking: Reported on 10/20/2019 05/08/18   Roxan Hockey, MD  dextromethorphan-guaiFENesin [facility] DM) 30-600 MG 12hr tablet Take 1 tablet by mouth 2 (two) times daily. Patient not taking: Reported on 10/20/2019 06/18/18   Barton Dubois, MD  doxycycline (VIBRAMYCIN) 100 MG capsule Take 1 capsule (100 mg total) by mouth 2 (two) times daily. Patient not taking: Reported on 10/20/2019 10/04/19   Rolland Porter, MD  oxyCODONE-acetaminophen (PERCOCET/ROXICET) 5-325 MG tablet Take 1 tablet by mouth every 8 (eight) hours as needed for moderate pain. Patient not taking: Reported on 05/12/2019 05/08/18   Roxan Hockey, MD  predniSONE (DELTASONE) 20 MG tablet Take 3 po QD x 3d , then 2 po QD x 3d then 1 po QD x 3d Patient not taking: Reported on 10/20/2019 10/04/19   Rolland Porter, MD  topiramate (TOPAMAX) 25 MG tablet Take 1 tablet by mouth daily at bedtime X 1 week; then increase to 25mg  BID. Patient not taking: Reported on 10/20/2019 06/18/18   Barton Dubois, MD    Physical Exam:   Vitals:   10/20/19 1330 10/20/19 1400 10/20/19 1430 10/20/19 1500  BP: (!) 142/88 (!) 141/91 (!) 129/92 140/88  Pulse: (!) 113 (!) 113 (!) 111 (!) 113  Resp: (!) 37 (!) 39    Temp:      TempSrc:      SpO2: 95% 94% 100% 96%     Physical Exam: Blood pressure 140/88, pulse (!) 113, temperature 98.3 F (36.8 C), temperature source Oral, resp. rate (!) 39, SpO2 96 %. Gen: Chronically ill-appearing female sitting up in stretcher with shallow tachypnea and mild use of sternocleidomastoid without retractions. Eyes: sclera anicteric, conjuctiva mildly injected  bilaterally CVS: S1-S2, regulary, no gallops Respiratory: Patient with surprisingly good air entry bilaterally with rare low pitched wheeze. GI: NABS, soft, NT  LE: No edema. No cyanosis  Neuro: A/O x 3, patient has involuntary movements of her left eye and left side of her face status post remote operation for brain tumor.   Psych: patient is logical and coherent, judgement and insight appear normal, mood and affect appropriate to situation and appears sad. Skin: no rashes or lesions or ulcers,    Data Review:    Labs: Basic Metabolic Panel: Recent Labs  Lab 10/20/19 0715  NA 145  K 4.0  CL 103  CO2 27  GLUCOSE 101*  BUN 12  CREATININE 0.64  CALCIUM 9.9   Liver Function Tests: Recent Labs  Lab 10/20/19 0715  AST 28  ALT 15  ALKPHOS 203*  BILITOT 0.4  PROT 7.4  ALBUMIN 3.9   No results for input(s): LIPASE, AMYLASE in the last 168 hours. No results for input(s): AMMONIA in the last 168 hours. CBC: Recent Labs  Lab 10/20/19 0715  WBC 9.6  NEUTROABS 7.2  HGB 13.9  HCT 40.6  MCV 84.6  PLT 265   Cardiac Enzymes: No results for input(s): CKTOTAL, CKMB, CKMBINDEX, TROPONINI in the last 168 hours.  BNP (last 3 results) No results for input(s): PROBNP in the last 8760 hours. CBG: No results for input(s): GLUCAP in the last 168 hours.  Urinalysis    Component Value Date/Time   COLORURINE YELLOW 05/05/2018 [place] 05/05/2018 1539   LABSPEC 1.006 05/05/2018 1539   PHURINE 6.0 05/05/2018 1539   GLUCOSEU NEGATIVE 05/05/2018 1539   HGBUR NEGATIVE 05/05/2018 1539   BILIRUBINUR NEGATIVE 05/05/2018 [place] 05/05/2018 1539   PROTEINUR NEGATIVE 05/05/2018 1539   UROBILINOGEN 0.2 03/31/2014 1406   NITRITE NEGATIVE 05/05/2018 1539   LEUKOCYTESUR NEGATIVE 05/05/2018 1539      Radiographic Studies: DG Chest Portable 1 View  Result Date: 10/20/2019 CLINICAL DATA:  Shortness of breath. EXAM: PORTABLE CHEST 1 VIEW COMPARISON:   CT 10/11/2019.  Chest x-ray 10/03/2019. FINDINGS: Mediastinum and hilar structures normal. Heart size normal. COPD. Mild bibasilar subsegmental atelectasis. Tiny left pleural effusion cannot be excluded. IMPRESSION: COPD. Mild bibasilar subsegmental atelectasis and or scarring. Tiny left pleural effusion cannot be excluded. Electronically Signed   By: Marcello Moores  Register   On: 10/20/2019 07:43    EKG: Independently reviewed.  Poor baseline.  Sinus tach at 120.  Normal axis.  Q waves V1 to V2.  Poor R wave progression.  No acute ST-T wave changes.   Assessment/Plan:   Principal Problem:   COPD with acute exacerbation (HCC) Active Problems:   HTN (hypertension)   Adenocarcinoma of right lung, stage 3 (HCC)   GERD (gastroesophageal reflux disease)  69 year old female with stage IIIb-IV lung cancer presents with recurrence of COPD flare 3 days after finishing her last dose of steroids.  COPD Patient appears short of breath with shallow tachypnea and some sternocleidomastoid use however lungs have good air entry and low pitched wheezes.  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 Will restart treatment with Solu-Medrol. Trial of azithromycin Dosepak given cough not improved on doxycycline. Inhaled bronchodilators, intensive for the first 24 hours and then down to DuoNebs every 6 hours. Of note patient states she does not smoke however I believe I smelled some tobacco smoke on her. Continue nicotine patch  HTN Patient apparently has not been taking her metoprolol for a while She is normotensive here so we will hold  metoprolol  Depression Patient does not appear to be on an antidepressant as an outpatient, can this can be addressed by her PCP as warranted  GERD Continue pantoprazole    Other information:   DVT prophylaxis: Enoxaparin  ordered. Code Status: Full Family Communication: Patient states she will communicate with her family Disposition Plan: Home Consults called: None Admission status: Inpatient  [place]  If 7PM-7AM, please contact night-coverage [email or web address] Password [facility] 10/20/2019, 4:19 PM

## 2019-10-20 NOTE — ED Notes (Signed)
Called left message with Dr. Worthy Flank nurse that patient was here in the ED and that is why she missed her 7am appointment with him, per patient request.

## 2019-10-20 NOTE — ED Notes (Signed)
Pt ambulated to bathroom on 2 L @ 97% pt O2 dropped to 87%

## 2019-10-21 ENCOUNTER — Other Ambulatory Visit: Payer: Self-pay

## 2019-10-21 DIAGNOSIS — C3491 Malignant neoplasm of unspecified part of right bronchus or lung: Secondary | ICD-10-CM

## 2019-10-21 DIAGNOSIS — J441 Chronic obstructive pulmonary disease with (acute) exacerbation: Principal | ICD-10-CM

## 2019-10-21 DIAGNOSIS — J9621 Acute and chronic respiratory failure with hypoxia: Secondary | ICD-10-CM

## 2019-10-21 DIAGNOSIS — J449 Chronic obstructive pulmonary disease, unspecified: Secondary | ICD-10-CM

## 2019-10-21 LAB — HIV ANTIBODY (ROUTINE TESTING W REFLEX): HIV Screen 4th Generation wRfx: NONREACTIVE

## 2019-10-21 MED ORDER — BUDESONIDE 0.25 MG/2ML IN SUSP
0.2500 mg | Freq: Two times a day (BID) | RESPIRATORY_TRACT | Status: DC
  Administered 2019-10-21 – 2019-10-23 (×5): 0.25 mg via RESPIRATORY_TRACT
  Filled 2019-10-21 (×5): qty 2

## 2019-10-21 MED ORDER — METOPROLOL TARTRATE 12.5 MG HALF TABLET
12.5000 mg | ORAL_TABLET | Freq: Two times a day (BID) | ORAL | Status: DC
  Administered 2019-10-21 – 2019-10-23 (×5): 12.5 mg via ORAL
  Filled 2019-10-21 (×5): qty 1

## 2019-10-21 NOTE — Progress Notes (Signed)
MEWS yellow d/t HR of 112. Marzetta Board, MD paged regarding the change in status. No new orders. Focus assessment completed. Pt stable. Increased vital sign frequency based on a yellow MEWS score has been initiated.

## 2019-10-21 NOTE — Progress Notes (Signed)
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  She was recently seen at [facility], ED for similar symptoms, treated with prednisone and doxycycline as an outpatient.  She felt a little bit better however after stopping the prednisone her dyspnea returned.  Tells me she last smoked was about a month ago.  Subjective / 24h Interval events: Complains of persistent shortness of breath this morning, wheezing, as well as a cough.  She is not even able to take but chest a few steps without stopping due to dyspnea.  No chest pain, no abdominal pain, no nausea or vomiting  Assessment & Plan: Principal Problem Acute on chronic hypoxic respiratory failure due to COPD exacerbation-patient with increased respiratory effort this morning, tachypneic and using accessory muscle, continue breathing treatments, steroids IV, as well as nebulizers.  Add Pulmicort to her regimen.  High risk of decompensation given underlying COPD and lung cancer, continue to monitor in the hospital  Active Problems Essential hypertension-apparently has not been taking her metoprolol for a while.  Add back at a very low dose due to tachycardia  Tobacco use-continue nicotine patch, patient reports she has not smoked in a month, encouraged continued cessation   GERD-continue PPI  Recurrent non-small cell lung cancer initially diagnosed as stage IIIa-on chemotherapy as an outpatient  Scheduled Meds:  azithromycin  250 mg Oral Daily   enoxaparin (LOVENOX) injection  40 mg Subcutaneous Q24H   feeding supplement (ENSURE ENLIVE)  237 mL Oral TID BM   ipratropium-albuterol   3 mL Nebulization QID   methylPREDNISolone (SOLU-MEDROL) injection  40 mg Intravenous Q6H   nicotine  14 mg Transdermal Daily   pantoprazole  40 mg Oral Daily   sodium chloride flush  3 mL Intravenous Q12H   Continuous Infusions:  sodium chloride     PRN Meds:.sodium chloride, acetaminophen **OR** acetaminophen, albuterol, bisacodyl, diclofenac sodium, methocarbamol, polyethylene glycol, sodium chloride flush  Diet Orders (From admission, onward)    Start     Ordered   10/20/19 1616  Diet regular Room service appropriate? Yes; Fluid consistency: Thin  Diet effective now       Question Answer Comment  Room service appropriate? Yes   Fluid consistency: Thin      10/20/19 1617          DVT prophylaxis: enoxaparin (LOVENOX) injection 40 mg Start: 10/20/19 1630     Code Status: Full Code  Family Communication: no family at bedside   Status is: Inpatient  Remains inpatient appropriate because:Persistent respiratory distress, tachypnea, accessory muscle use   Dispo: The patient is from: Home              Anticipated d/c is to: Home              Anticipated d/c date is: 2 days              Patient currently is not medically stable to d/c.  Consultants:  None  Procedures:  None  Microbiology  None  Antimicrobials: Azithromycin   Objective: Vitals:   10/21/19 0626 10/21/19 0814 10/21/19 0929 10/21/19 1000  BP: 133/84  119/70   Pulse: (!) 109  Marland Kitchen)  112 (!) 116  Resp: 18  17   Temp: 98.3 F (36.8 C)  97.8 F (36.6 C)   TempSrc: Oral  Oral   SpO2: 100% 94% 99% 98%  Weight:      Height:        Intake/Output Summary (Last 24 hours) at 10/21/2019 1127 Last data filed at 10/21/2019 1059 Gross per 24 hour  Intake 300 ml  Output 950 ml  Net -650 ml   Filed Weights   10/20/19 2200  Weight: 63.5 kg    Examination:  Constitutional: Appears short of breath, sitting up, using accessory muscles while getting a breathing treatment Eyes: no scleral  icterus ENMT: Mucous membranes are moist.  Neck: normal, supple Respiratory: Diffuse end expiratory wheezing, increased respiratory effort with accessory muscle use. Cardiovascular: Regular rate and rhythm, no murmurs / rubs / gallops. No LE edema. Abdomen: non distended, no tenderness. Bowel sounds positive.  Musculoskeletal: no clubbing / cyanosis.  Skin: no rashes Neurologic: No focal deficits  Data Reviewed: I have independently reviewed following labs and imaging studies   CBC: Recent Labs  Lab 10/20/19 0715 10/20/19 2048  WBC 9.6 5.3  NEUTROABS 7.2  --   HGB 13.9 13.7  HCT 40.6 41.0  MCV 84.6 85.6  PLT 265 732   Basic Metabolic Panel: Recent Labs  Lab 10/20/19 0715 10/20/19 2048  NA 145  --   K 4.0  --   CL 103  --   CO2 27  --   GLUCOSE 101*  --   BUN 12  --   CREATININE 0.64 0.67  CALCIUM 9.9  --    Liver Function Tests: Recent Labs  Lab 10/20/19 0715  AST 28  ALT 15  ALKPHOS 203*  BILITOT 0.4  PROT 7.4  ALBUMIN 3.9   Coagulation Profile: No results for input(s): INR, PROTIME in the last 168 hours. HbA1C: No results for input(s): HGBA1C in the last 72 hours. CBG: No results for input(s): GLUCAP in the last 168 hours.  Recent Results (from the past 240 hour(s))  SARS Coronavirus 2 by RT PCR (hospital order, performed in [facility] hospital lab) Nasopharyngeal Nasopharyngeal Swab     Status: None   Collection Time: 10/20/19 12:21 PM   Specimen: Nasopharyngeal Swab  Result Value Ref Range Status   SARS Coronavirus 2 NEGATIVE NEGATIVE Final    Comment: (NOTE) SARS-CoV-2 target nucleic acids are NOT DETECTED.  The SARS-CoV-2 RNA is generally detectable in upper and lower respiratory specimens during the acute phase of infection. The lowest concentration of SARS-CoV-2 viral copies this assay can detect is 250 copies / mL. A negative result does not preclude SARS-CoV-2 infection and should not be used as the sole basis for treatment or  other patient management decisions.  A negative result may occur with improper specimen collection / handling, submission of specimen other than nasopharyngeal swab, presence of viral mutation(s) within the areas targeted by this assay, and inadequate number of viral copies (<250 copies / mL). A negative result must be combined with clinical observations, patient history, and epidemiological information.  Fact Sheet for Patients:   StrictlyIdeas.no  Fact Sheet for Healthcare Providers: BankingDealers.co.za  This test is not yet approved or  cleared by the [place] FDA and has been authorized for detection and/or diagnosis of SARS-CoV-2 by FDA under an Emergency Use Authorization (EUA).  This EUA will remain in effect (meaning this test can be used) for the duration of the COVID-19 declaration under Section  564(b)(1) of the Act, 21 U.S.C. section 360bbb-3(b)(1), unless the authorization is terminated or revoked sooner.  Performed at [facility], [place] [address] [place], [place] [address]      Radiology Studies: No results found.   Marzetta Board, MD, PhD [facility]  Between 7 am - 7 pm I am available, please contact me via Amion or Securechat  Between 7 pm - 7 am I am not available, please contact night coverage MD/APP via Amion

## 2019-10-22 LAB — CBC
HCT: 36.9 % (ref 36.0–46.0)
Hemoglobin: 12.4 g/dL (ref 12.0–15.0)
MCH: 28.6 pg (ref 26.0–34.0)
MCHC: 33.6 g/dL (ref 30.0–36.0)
MCV: 85 fL (ref 80.0–100.0)
Platelets: 240 10*3/uL (ref 150–400)
RBC: 4.34 MIL/uL (ref 3.87–5.11)
RDW: 15.3 % (ref 11.5–15.5)
WBC: 10.6 10*3/uL — ABNORMAL HIGH (ref 4.0–10.5)
nRBC: 0 % (ref 0.0–0.2)

## 2019-10-22 LAB — BASIC METABOLIC PANEL
Anion gap: 9 (ref 5–15)
BUN: 21 mg/dL (ref 8–23)
CO2: 27 mmol/L (ref 22–32)
Calcium: 9 mg/dL (ref 8.9–10.3)
Chloride: 104 mmol/L (ref 98–111)
Creatinine, Ser: 0.59 mg/dL (ref 0.44–1.00)
GFR calc Af Amer: >60 mL/min (ref >60)
GFR calc non Af Amer: >60 mL/min (ref >60)
Glucose, Bld: 131 mg/dL — ABNORMAL HIGH (ref 70–99)
Potassium: 4.5 mmol/L (ref 3.5–5.1)
Sodium: 140 mmol/L (ref 135–145)

## 2019-10-22 MED ORDER — IPRATROPIUM-ALBUTEROL 0.5-2.5 (3) MG/3ML IN SOLN
3.0000 mL | Freq: Three times a day (TID) | RESPIRATORY_TRACT | Status: DC
  Administered 2019-10-22 – 2019-10-23 (×3): 3 mL via RESPIRATORY_TRACT
  Filled 2019-10-22 (×3): qty 3

## 2019-10-22 NOTE — Progress Notes (Signed)
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  She was recently seen at [facility], ED for similar symptoms, treated with prednisone and doxycycline as an outpatient.  She felt a little bit better however after stopping the prednisone her dyspnea returned.  Tells me she last smoked was about a month ago.  Subjective / 24h Interval events: Feeling better, still short of breath.  She was able to walk to the bathroom and back and felt a little bit stronger than yesterday but had to stop midway  Assessment & Plan: Principal Problem Acute on chronic hypoxic respiratory failure due to COPD exacerbation-overall improving, continues to be extremely dyspneic and using accessory muscles with ambulation, continue antibiotics, steroids, nebulizers.  Remains high risk  Active Problems Essential hypertension-apparently has not been taking her metoprolol for a while.  Blood pressure better  Tobacco use-continue nicotine patch, patient reports she has not smoked in a month, encouraged continued cessation   GERD-continue PPI  Recurrent non-small cell lung cancer initially diagnosed as stage IIIa-on chemotherapy, outpatient follow-up with primary oncologist  Scheduled Meds: . azithromycin  250 mg Oral Daily  . budesonide (PULMICORT) nebulizer solution  0.25 mg Nebulization BID  . enoxaparin (LOVENOX) injection  40 mg Subcutaneous Q24H  . feeding supplement (ENSURE ENLIVE)  237 mL Oral TID BM  . ipratropium-albuterol  3 mL Nebulization TID  . methylPREDNISolone (SOLU-MEDROL) injection  40 mg Intravenous Q6H  . metoprolol tartrate  12.5 mg Oral BID   . nicotine  14 mg Transdermal Daily  . pantoprazole  40 mg Oral Daily  . sodium chloride flush  3 mL Intravenous Q12H   Continuous Infusions: . sodium chloride     PRN Meds:.sodium chloride, acetaminophen **OR** acetaminophen, albuterol, bisacodyl, diclofenac sodium, methocarbamol, polyethylene glycol, sodium chloride flush  Diet Orders (From admission, onward)    Start     Ordered   10/20/19 1616  Diet regular Room service appropriate? Yes; Fluid consistency: Thin  Diet effective now       Question Answer Comment  Room service appropriate? Yes   Fluid consistency: Thin      10/20/19 1617          DVT prophylaxis: enoxaparin (LOVENOX) injection 40 mg Start: 10/20/19 1630     Code Status: Full Code  Family Communication: no family at bedside   Status is: Inpatient  Remains inpatient appropriate because:Persistent respiratory distress, tachypnea, accessory muscle use   Dispo: The patient is from: Home              Anticipated d/c is to: Home              Anticipated d/c date is: 1 day              Patient currently is not medically stable to d/c.  Consultants:  None  Procedures:  None  Microbiology  None  Antimicrobials: Azithromycin   Objective: Vitals:   10/22/19 0144 10/22/19 0526 10/22/19 0752 10/22/19 1316  BP: 121/76 124/78  125/81  Pulse: 92 95  98  Resp: 16 18  16   Temp: 98.3 F (36.8 C) 97.9 F (36.6 C)  98.4 F (  36.9 C)  TempSrc: Oral   Oral  SpO2: 98% 100% 99% 100%  Weight:      Height:        Intake/Output Summary (Last 24 hours) at 10/22/2019 1407 Last data filed at 10/22/2019 1233 Gross per 24 hour  Intake 660 ml  Output 3500 ml  Net -2840 ml   Filed Weights   10/20/19 2200  Weight: 63.5 kg    Examination:  Constitutional: Overall more comfortable than yesterday Eyes: No icterus ENMT: Mucous membranes are moist.  Neck: normal, supple Respiratory: Very distant end expiratory wheezing, much improved, still tachypneic at  times with accessory muscle use Cardiovascular: Regular rate and rhythm, no murmurs, no edema Abdomen: Soft, nontender, nondistended, positive bowel sounds Musculoskeletal: no clubbing / cyanosis.  Skin: No rashes seen Neurologic: Nonfocal, equal strength  Data Reviewed: I have independently reviewed following labs and imaging studies   CBC: Recent Labs  Lab 10/20/19 0715 10/20/19 2048 10/22/19 0312  WBC 9.6 5.3 10.6*  NEUTROABS 7.2  --   --   HGB 13.9 13.7 12.4  HCT 40.6 41.0 36.9  MCV 84.6 85.6 85.0  PLT 265 272 681   Basic Metabolic Panel: Recent Labs  Lab 10/20/19 0715 10/20/19 2048 10/22/19 0312  NA 145  --  140  K 4.0  --  4.5  CL 103  --  104  CO2 27  --  27  GLUCOSE 101*  --  131*  BUN 12  --  21  CREATININE 0.64 0.67 0.59  CALCIUM 9.9  --  9.0   Liver Function Tests: Recent Labs  Lab 10/20/19 0715  AST 28  ALT 15  ALKPHOS 203*  BILITOT 0.4  PROT 7.4  ALBUMIN 3.9   Coagulation Profile: No results for input(s): INR, PROTIME in the last 168 hours. HbA1C: No results for input(s): HGBA1C in the last 72 hours. CBG: No results for input(s): GLUCAP in the last 168 hours.  Recent Results (from the past 240 hour(s))  SARS Coronavirus 2 by RT PCR (hospital order, performed in [facility] hospital lab) Nasopharyngeal Nasopharyngeal Swab     Status: None   Collection Time: 10/20/19 12:21 PM   Specimen: Nasopharyngeal Swab  Result Value Ref Range Status   SARS Coronavirus 2 NEGATIVE NEGATIVE Final    Comment: (NOTE) SARS-CoV-2 target nucleic acids are NOT DETECTED.  The SARS-CoV-2 RNA is generally detectable in upper and lower respiratory specimens during the acute phase of infection. The lowest concentration of SARS-CoV-2 viral copies this assay can detect is 250 copies / mL. A negative result does not preclude SARS-CoV-2 infection and should not be used as the sole basis for treatment or other patient management decisions.  A negative result may occur  with improper specimen collection / handling, submission of specimen other than nasopharyngeal swab, presence of viral mutation(s) within the areas targeted by this assay, and inadequate number of viral copies (<250 copies / mL). A negative result must be combined with clinical observations, patient history, and epidemiological information.  Fact Sheet for Patients:   StrictlyIdeas.no  Fact Sheet for Healthcare Providers: BankingDealers.co.za  This test is not yet approved or  cleared by the [place] FDA and has been authorized for detection and/or diagnosis of SARS-CoV-2 by FDA under an Emergency Use Authorization (EUA).  This EUA will remain in effect (meaning this test can be used) for the duration of the COVID-19 declaration under Section 564(b)(1) of the Act, 21 U.S.C. section 360bbb-3(b)(1), unless the authorization  is terminated or revoked sooner.  Performed at [facility], [place] [address], [place],  [address]      Radiology Studies: No results found.   Marzetta Board, MD, PhD [facility]  Between 7 am - 7 pm I am available, please contact me via Amion or Securechat  Between 7 pm - 7 am I am not available, please contact night coverage MD/APP via Amion

## 2019-10-23 DIAGNOSIS — K219 Gastro-esophageal reflux disease without esophagitis: Secondary | ICD-10-CM

## 2019-10-23 MED ORDER — IBUPROFEN 200 MG PO TABS
200.0000 mg | ORAL_TABLET | Freq: Four times a day (QID) | ORAL | Status: DC | PRN
  Administered 2019-10-23: 200 mg via ORAL
  Filled 2019-10-23: qty 1

## 2019-10-23 MED ORDER — PREDNISONE 10 MG PO TABS
10.0000 mg | ORAL_TABLET | Freq: Every day | ORAL | 0 refills | Status: DC
Start: 2019-10-23 — End: 2019-11-09

## 2019-10-23 MED ORDER — AZITHROMYCIN 250 MG PO TABS: 250.0000 mg | ORAL_TABLET | Freq: Every day | ORAL | 0 refills | Status: DC

## 2019-10-23 NOTE — Plan of Care (Signed)
Discharge teaching done.  Written information given.

## 2019-10-23 NOTE — Discharge Summary (Signed)
Physician Discharge Summary  Courtney Zuniga OVZ:[id number] DOB: [birth year] DOA: 10/20/2019  PCP: Barry Dienes, NP  Admit date: 10/20/2019 Discharge date: 10/23/2019  Admitted From: home  Disposition:  Home   Recommendations for Outpatient Follow-up:  1. Follow up with PCP in 1-2 weeks 2. Follow up with Oncology as scheduled  Home Health: none Equipment/Devices: none  Discharge Condition: stable CODE STATUS: Full code Diet recommendation: regular   HPI: Per admitting MD, Courtney Zuniga is an 69 y.o. 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 Patient states that she was recently seen at [facility] for similar symptoms.  She was treated with oral prednisone and doxycycline as an outpatient.  She states she felt better while she was on the medication however within 3 days of stopping the prednisone patient again became short of breath and started wheezing.  Patient denies that she smokes anymore.  Patient denies any fevers or chills.  She does have a cough that is nonproductive that did not improve on the doxycycline.  No chest pain.   Hospital Course / Discharge diagnoses: Principal Problem Acute on chronic hypoxic respiratory failure due to COPD exacerbation-patient was admitted to the hospital with acute on chronic hypoxic respiratory failure due to COPD exacerbation.  She was treated with IV steroids, scheduled nebulizers as well as antibiotics.  She was somewhat slow but eventually improved and returned to baseline.  She is on chronic oxygen at home.  Her wheezing has resolved, she is able to ambulate to the bathroom and back and feels at baseline.  She will be discharged home in stable condition with outpatient follow-up with her PCP.  She will be placed on a prednisone taper along with antibiotics to finish a 5-day course.  Active  Problems Essential hypertension-resume home medications on discharge Tobacco use-patient reports she has not smoked in a month, encouraged continued cessation  GERD-continue PPI Recurrent non-small cell lung cancer initially diagnosed as stage IIIa-on chemotherapy, outpatient follow-up with primary oncologist  Discharge Instructions   Allergies as of 10/23/2019   No Known Allergies     Medication List    STOP taking these medications   doxycycline 100 MG capsule Commonly known as: VIBRAMYCIN     TAKE these medications   acetaminophen 325 MG tablet Commonly known as: TYLENOL Take 2 tablets (650 mg total) by mouth every 6 (six) hours as needed for mild pain, fever or headache.   albuterol 108 (90 Base) MCG/ACT inhaler Commonly known as: VENTOLIN HFA Inhale 2 puffs into the lungs every 4 (four) hours as needed for shortness of breath.   azithromycin 250 MG tablet Commonly known as: ZITHROMAX Take 1 tablet (250 mg total) by mouth daily.   Breo Ellipta 100-25 MCG/INH Aepb Generic drug: fluticasone furoate-vilanterol Inhale 1 puff into the lungs daily.   cyclobenzaprine 10 MG tablet Commonly known as: FLEXERIL Take 1 tablet (10 mg total) by mouth 2 (two) times daily. *MAy take one additional tablet as needed for muscle spasms   dextromethorphan-guaiFENesin 30-600 MG 12hr tablet Commonly known as: MUCINEX DM Take 1 tablet by mouth 2 (two) times daily.   diclofenac sodium 1 % Gel Commonly known as: VOLTAREN Apply topically 4 (four) times daily as needed (massge gel into affected area(s) as needed for pain).   Ensure Take 237 mLs by mouth 3 (three) times daily between meals.   ipratropium-albuterol 0.5-2.5 (3)  MG/3ML Soln Commonly known as: DUONEB Take 3 mLs by nebulization 3 (three) times daily.   methocarbamol 500 MG tablet Commonly known as: ROBAXIN Take 1 tablet (500 mg total) by mouth every 8 (eight) hours as needed for muscle spasms.   metoprolol tartrate 25 MG  tablet Commonly known as: LOPRESSOR Take 1 tablet (25 mg total) by mouth 2 (two) times daily.   nicotine 14 mg/24hr patch Commonly known as: NICODERM CQ - dosed in mg/24 hours Place 1 patch (14 mg total) onto the skin daily.   oxyCODONE-acetaminophen 5-325 MG tablet Commonly known as: PERCOCET/ROXICET Take 1 tablet by mouth every 8 (eight) hours as needed for moderate pain.   pantoprazole 40 MG tablet Commonly known as: PROTONIX Take 1 tablet (40 mg total) by mouth daily.   predniSONE 10 MG tablet Commonly known as: DELTASONE Take 1 tablet (10 mg total) by mouth daily. 4 tablets daily x 3 days then 3 daily x 3 days then 2 daily x 3 days then 1 daily x 3 days What changed:   medication strength  how much to take  how to take this  when to take this  additional instructions   Robitussin 12 Hour Cough 30 MG/5ML liquid Generic drug: dextromethorphan Take 30 mg by mouth every 4 (four) hours as needed for cough.   topiramate 25 MG tablet Commonly known as: Topamax Take 1 tablet by mouth daily at bedtime X 1 week; then increase to 25mg  BID.   Vitamin D3 125 MCG (5000 UT) Caps Take 1 capsule by mouth daily.       Follow-up Information    Barry Dienes, NP. Schedule an appointment as soon as possible for a visit in 1 week(s).   Specialty: Nurse Practitioner Contact information: [place] [address] [phone]               Consultations:  None   Procedures/Studies:  DG Chest 1 View  Result Date: 10/03/2019 CLINICAL DATA:  Cough and shortness of breath cough, congestion EXAM: CHEST  1 VIEW COMPARISON:  06/15/2019 FINDINGS: The lungs are hyperinflated with diffuse interstitial prominence. No focal airspace consolidation or pulmonary edema. No pleural effusion or pneumothorax. Normal cardiomediastinal contours. Bibasilar scarring is unchanged IMPRESSION: COPD without acute airspace disease. Electronically Signed   By: Ulyses Jarred M.D.   On:  10/03/2019 20:21   CT Chest W Contrast  Result Date: 10/11/2019 CLINICAL DATA:  Non-small cell lung cancer restaging EXAM: CT CHEST, ABDOMEN, AND PELVIS WITH CONTRAST TECHNIQUE: Multidetector CT imaging of the chest, abdomen and pelvis was performed following the standard protocol during bolus administration of intravenous contrast. CONTRAST:  143mL OMNIPAQUE IOHEXOL 300 MG/ML SOLN, additional oral enteric contrast COMPARISON:  07/05/2019, 04/03/2019 FINDINGS: CT CHEST FINDINGS Cardiovascular: Aortic atherosclerosis. Normal heart size. Three-vessel coronary artery calcifications. No pericardial effusion. Mediastinum/Nodes: No enlarged mediastinal, hilar, or axillary lymph nodes. Thyroid gland, trachea, and esophagus demonstrate no significant findings. Lungs/Pleura: Severe centrilobular emphysema. Interval increase in consolidation and volume loss of the right middle lobe, now with almost complete fibrotic consolidation (series 6, image 106). Bandlike scarring of the bilateral lung bases. No pleural effusion or pneumothorax. Musculoskeletal: No chest wall mass or suspicious bone lesions identified. CT ABDOMEN PELVIS FINDINGS Hepatobiliary: No focal liver abnormality is seen. Status post cholecystectomy. Postoperative biliary dilatation. Pancreas: Unremarkable. No pancreatic ductal dilatation or surrounding inflammatory changes. Spleen: Normal in size without significant abnormality. Adrenals/Urinary Tract: Adrenal glands are unremarkable. Kidneys are normal, without renal calculi, solid lesion,  or hydronephrosis. Bladder is unremarkable. Stomach/Bowel: Stomach is within normal limits. Appendix appears normal. No evidence of bowel wall thickening, distention, or inflammatory changes. Sigmoid diverticulosis. Vascular/Lymphatic: Aortic atherosclerosis. No enlarged abdominal or pelvic lymph nodes. Reproductive: Calcified uterine fibroids. Other: No abdominal wall hernia or abnormality. No abdominopelvic ascites.  Musculoskeletal: No acute or significant osseous findings. IMPRESSION: 1. Interval increase in consolidation and volume loss of the right middle lobe, consistent with continued treatment response and developing fibrosis. 2. No evidence of recurrent or metastatic disease within the chest, abdomen, or pelvis. 3. Emphysema (ICD10-J43.9). 4. Coronary artery disease. Aortic Atherosclerosis (ICD10-I70.0). Electronically Signed   By: Eddie Candle M.D.   On: 10/11/2019 09:26   CT Abdomen Pelvis W Contrast  Result Date: 10/11/2019 CLINICAL DATA:  Non-small cell lung cancer restaging EXAM: CT CHEST, ABDOMEN, AND PELVIS WITH CONTRAST TECHNIQUE: Multidetector CT imaging of the chest, abdomen and pelvis was performed following the standard protocol during bolus administration of intravenous contrast. CONTRAST:  136mL OMNIPAQUE IOHEXOL 300 MG/ML SOLN, additional oral enteric contrast COMPARISON:  07/05/2019, 04/03/2019 FINDINGS: CT CHEST FINDINGS Cardiovascular: Aortic atherosclerosis. Normal heart size. Three-vessel coronary artery calcifications. No pericardial effusion. Mediastinum/Nodes: No enlarged mediastinal, hilar, or axillary lymph nodes. Thyroid gland, trachea, and esophagus demonstrate no significant findings. Lungs/Pleura: Severe centrilobular emphysema. Interval increase in consolidation and volume loss of the right middle lobe, now with almost complete fibrotic consolidation (series 6, image 106). Bandlike scarring of the bilateral lung bases. No pleural effusion or pneumothorax. Musculoskeletal: No chest wall mass or suspicious bone lesions identified. CT ABDOMEN PELVIS FINDINGS Hepatobiliary: No focal liver abnormality is seen. Status post cholecystectomy. Postoperative biliary dilatation. Pancreas: Unremarkable. No pancreatic ductal dilatation or surrounding inflammatory changes. Spleen: Normal in size without significant abnormality. Adrenals/Urinary Tract: Adrenal glands are unremarkable. Kidneys are  normal, without renal calculi, solid lesion, or hydronephrosis. Bladder is unremarkable. Stomach/Bowel: Stomach is within normal limits. Appendix appears normal. No evidence of bowel wall thickening, distention, or inflammatory changes. Sigmoid diverticulosis. Vascular/Lymphatic: Aortic atherosclerosis. No enlarged abdominal or pelvic lymph nodes. Reproductive: Calcified uterine fibroids. Other: No abdominal wall hernia or abnormality. No abdominopelvic ascites. Musculoskeletal: No acute or significant osseous findings. IMPRESSION: 1. Interval increase in consolidation and volume loss of the right middle lobe, consistent with continued treatment response and developing fibrosis. 2. No evidence of recurrent or metastatic disease within the chest, abdomen, or pelvis. 3. Emphysema (ICD10-J43.9). 4. Coronary artery disease. Aortic Atherosclerosis (ICD10-I70.0). Electronically Signed   By: Eddie Candle M.D.   On: 10/11/2019 09:26   DG Chest Portable 1 View  Result Date: 10/20/2019 CLINICAL DATA:  Shortness of breath. EXAM: PORTABLE CHEST 1 VIEW COMPARISON:  CT 10/11/2019.  Chest x-ray 10/03/2019. FINDINGS: Mediastinum and hilar structures normal. Heart size normal. COPD. Mild bibasilar subsegmental atelectasis. Tiny left pleural effusion cannot be excluded. IMPRESSION: COPD. Mild bibasilar subsegmental atelectasis and or scarring. Tiny left pleural effusion cannot be excluded. Electronically Signed   By: Marcello Moores  Register   On: 10/20/2019 07:43      Subjective: - no chest pain, shortness of breath, no abdominal pain, nausea or vomiting.    Discharge Exam: BP (!) 150/98 (BP Location: Left Arm)   Pulse 86   Temp 97.9 F (36.6 C)   Resp 16   Ht 5\' 7"  (1.702 m)   Wt 63.5 kg   SpO2 98%   BMI 21.93 kg/m   General: Pt is alert, awake, not in acute distress Cardiovascular: RRR, S1/S2 +, no rubs, no gallops Respiratory: CTA  bilaterally, no wheezing, no rhonchi Abdominal: Soft, NT, ND, bowel sounds  + Extremities: no edema, no cyanosis    The results of significant diagnostics from this hospitalization (including imaging, microbiology, ancillary and laboratory) are listed below for reference.     Microbiology: Recent Results (from the past 240 hour(s))  SARS Coronavirus 2 by RT PCR (hospital order, performed in [facility] hospital lab) Nasopharyngeal Nasopharyngeal Swab     Status: None   Collection Time: 10/20/19 12:21 PM   Specimen: Nasopharyngeal Swab  Result Value Ref Range Status   SARS Coronavirus 2 NEGATIVE NEGATIVE Final    Comment: (NOTE) SARS-CoV-2 target nucleic acids are NOT DETECTED.  The SARS-CoV-2 RNA is generally detectable in upper and lower respiratory specimens during the acute phase of infection. The lowest concentration of SARS-CoV-2 viral copies this assay can detect is 250 copies / mL. A negative result does not preclude SARS-CoV-2 infection and should not be used as the sole basis for treatment or other patient management decisions.  A negative result may occur with improper specimen collection / handling, submission of specimen other than nasopharyngeal swab, presence of viral mutation(s) within the areas targeted by this assay, and inadequate number of viral copies (<250 copies / mL). A negative result must be combined with clinical observations, patient history, and epidemiological information.  Fact Sheet for Patients:   StrictlyIdeas.no  Fact Sheet for Healthcare Providers: BankingDealers.co.za  This test is not yet approved or  cleared by the [place] FDA and has been authorized for detection and/or diagnosis of SARS-CoV-2 by FDA under an Emergency Use Authorization (EUA).  This EUA will remain in effect (meaning this test can be used) for the duration of the COVID-19 declaration under Section 564(b)(1) of the Act, 21 U.S.C. section 360bbb-3(b)(1), unless the authorization is terminated  or revoked sooner.  Performed at [facility], [place] [address] [place], [place] [address]      Labs: Basic Metabolic Panel: Recent Labs  Lab 10/20/19 0715 10/20/19 2048 10/22/19 0312  NA 145  --  140  K 4.0  --  4.5  CL 103  --  104  CO2 27  --  27  GLUCOSE 101*  --  131*  BUN 12  --  21  CREATININE 0.64 0.67 0.59  CALCIUM 9.9  --  9.0   Liver Function Tests: Recent Labs  Lab 10/20/19 0715  AST 28  ALT 15  ALKPHOS 203*  BILITOT 0.4  PROT 7.4  ALBUMIN 3.9   CBC: Recent Labs  Lab 10/20/19 0715 10/20/19 2048 10/22/19 0312  WBC 9.6 5.3 10.6*  NEUTROABS 7.2  --   --   HGB 13.9 13.7 12.4  HCT 40.6 41.0 36.9  MCV 84.6 85.6 85.0  PLT 265 272 240   CBG: No results for input(s): GLUCAP in the last 168 hours. Hgb A1c No results for input(s): HGBA1C in the last 72 hours. Lipid Profile No results for input(s): CHOL, HDL, LDLCALC, TRIG, CHOLHDL, LDLDIRECT in the last 72 hours. Thyroid function studies No results for input(s): TSH, T4TOTAL, T3FREE, THYROIDAB in the last 72 hours.  Invalid input(s): FREET3 Urinalysis    Component Value Date/Time   COLORURINE YELLOW 05/05/2018 [place] 05/05/2018 1539   LABSPEC 1.006 05/05/2018 [place] 6.0 05/05/2018 [place] 05/05/2018 1539   HGBUR NEGATIVE 05/05/2018 1539   BILIRUBINUR NEGATIVE 05/05/2018 [place] 05/05/2018 1539   PROTEINUR NEGATIVE 05/05/2018 1539  UROBILINOGEN 0.2 03/31/2014 1406   NITRITE NEGATIVE 05/05/2018 1539   LEUKOCYTESUR NEGATIVE 05/05/2018 1539    FURTHER DISCHARGE INSTRUCTIONS:   Get Medicines reviewed and adjusted: Please take all your medications with you for your next visit with your Primary MD   Laboratory/radiological data: Please request your Primary MD to go over all hospital tests and procedure/radiological results at the follow up, please ask your Primary MD to get all Hospital records sent to his/her  office.   In some cases, they will be blood work, cultures and biopsy results pending at the time of your discharge. Please request that your primary care M.D. goes through all the records of your hospital data and follows up on these results.   Also Note the following: If you experience worsening of your admission symptoms, develop shortness of breath, life threatening emergency, suicidal or homicidal thoughts you must seek medical attention immediately by calling 911 or calling your MD immediately  if symptoms less severe.   You must read complete instructions/literature along with all the possible adverse reactions/side effects for all the Medicines you take and that have been prescribed to you. Take any new Medicines after you have completely understood and accpet all the possible adverse reactions/side effects.    Do not drive when taking Pain medications or sleeping medications (Benzodaizepines)   Do not take more than prescribed Pain, Sleep and Anxiety Medications. It is not advisable to combine anxiety,sleep and pain medications without talking with your primary care practitioner   Special Instructions: If you have smoked or chewed Tobacco  in the last 2 yrs please stop smoking, stop any regular Alcohol  and or any Recreational drug use.   Wear Seat belts while driving.   Please note: You were cared for by a hospitalist during your hospital stay. Once you are discharged, your primary care physician will handle any further medical issues. Please note that NO REFILLS for any discharge medications will be authorized once you are discharged, as it is imperative that you return to your primary care physician (or establish a relationship with a primary care physician if you do not have one) for your post hospital discharge needs so that they can reassess your need for medications and monitor your lab values.  Time coordinating discharge: 35 minutes  SIGNED:  Marzetta Board, MD,  PhD 10/23/2019, 9:01 AM

## 2019-10-23 NOTE — Discharge Instructions (Signed)
Follow with Barry Dienes, NP in 5-7 days  Please get a complete blood count and chemistry panel checked by your Primary MD at your next visit, and again as instructed by your Primary MD. Please get your medications reviewed and adjusted by your Primary MD.  Please request your Primary MD to go over all Hospital Tests and Procedure/Radiological results at the follow up, please get all Hospital records sent to your Prim MD by signing hospital release before you go home.  In some cases, there will be blood work, cultures and biopsy results pending at the time of your discharge. Please request that your primary care M.D. goes through all the records of your hospital data and follows up on these results.  If you had Pneumonia of Lung problems at the Hospital: Please get a 2 view Chest X ray done in 6-8 weeks after hospital discharge or sooner if instructed by your Primary MD.  If you have Congestive Heart Failure: Please call your Cardiologist or Primary MD anytime you have any of the following symptoms:  1) 3 pound weight gain in 24 hours or 5 pounds in 1 week  2) shortness of breath, with or without a dry hacking cough  3) swelling in the hands, feet or stomach  4) if you have to sleep on extra pillows at night in order to breathe  Follow cardiac low salt diet and 1.5 lit/day fluid restriction.  If you have diabetes Accuchecks 4 times/day, Once in AM empty stomach and then before each meal. Log in all results and show them to your primary doctor at your next visit. If any glucose reading is under 80 or above 300 call your primary MD immediately.  If you have Seizure/Convulsions/Epilepsy: Please do not drive, operate heavy machinery, participate in activities at heights or participate in high speed sports until you have seen by Primary MD or a Neurologist and advised to do so again. Per [facility] statutes, patients with seizures are not allowed to drive until they have been  seizure-free for six months.  Use caution when using heavy equipment or power tools. Avoid working on ladders or at heights. Take showers instead of baths. Ensure the water temperature is not too high on the home water heater. Do not go swimming alone. Do not lock yourself in a room alone (i.e. bathroom). When caring for infants or small children, sit down when holding, feeding, or changing them to minimize risk of injury to the child in the event you have a seizure. Maintain good sleep hygiene. Avoid alcohol.   If you had Gastrointestinal Bleeding: Please ask your Primary MD to check a complete blood count within one week of discharge or at your next visit. Your endoscopic/colonoscopic biopsies that are pending at the time of discharge, will also need to followed by your Primary MD.  Get Medicines reviewed and adjusted. Please take all your medications with you for your next visit with your Primary MD  Please request your Primary MD to go over all hospital tests and procedure/radiological results at the follow up, please ask your Primary MD to get all Hospital records sent to his/her office.  If you experience worsening of your admission symptoms, develop shortness of breath, life threatening emergency, suicidal or homicidal thoughts you must seek medical attention immediately by calling 911 or calling your MD immediately  if symptoms less severe.  You must read complete instructions/literature along with all the possible adverse reactions/side effects for all the Medicines you take  and that have been prescribed to you. Take any new Medicines after you have completely understood and accpet all the possible adverse reactions/side effects.   Do not drive or operate heavy machinery when taking Pain medications.   Do not take more than prescribed Pain, Sleep and Anxiety Medications  Special Instructions: If you have smoked or chewed Tobacco  in the last 2 yrs please stop smoking, stop any regular  Alcohol  and or any Recreational drug use.  Wear Seat belts while driving.  Please note You were cared for by a hospitalist during your hospital stay. If you have any questions about your discharge medications or the care you received while you were in the hospital after you are discharged, you can call the unit and asked to speak with the hospitalist on call if the hospitalist that took care of you is not available. Once you are discharged, your primary care physician will handle any further medical issues. Please note that NO REFILLS for any discharge medications will be authorized once you are discharged, as it is imperative that you return to your primary care physician (or establish a relationship with a primary care physician if you do not have one) for your aftercare needs so that they can reassess your need for medications and monitor your lab values.  You can reach the hospitalist office at phone [phone] or fax [phone]   If you do not have a primary care physician, you can call [phone] for a physician referral.  Activity: As tolerated with Full fall precautions use walker/cane & assistance as needed    Diet: regular  Disposition Home

## 2019-10-31 ENCOUNTER — Ambulatory Visit (HOSPITAL_COMMUNITY): Payer: Medicare HMO

## 2019-11-05 ENCOUNTER — Other Ambulatory Visit: Payer: Self-pay

## 2019-11-05 ENCOUNTER — Inpatient Hospital Stay (HOSPITAL_COMMUNITY)
Admission: EM | Admit: 2019-11-05 | Discharge: 2019-11-09 | DRG: 189 | Disposition: A | Discharge status: Home / Self Care | Payer: Medicare HMO | Attending: Family Medicine | Admitting: Family Medicine

## 2019-11-05 ENCOUNTER — Emergency Department (HOSPITAL_COMMUNITY): Payer: Medicare HMO

## 2019-11-05 ENCOUNTER — Encounter (HOSPITAL_COMMUNITY): Payer: Self-pay | Admitting: Emergency Medicine

## 2019-11-05 DIAGNOSIS — I10 Essential (primary) hypertension: Secondary | ICD-10-CM | POA: Diagnosis present

## 2019-11-05 DIAGNOSIS — Z20822 Contact with and (suspected) exposure to covid-19: Secondary | ICD-10-CM | POA: Diagnosis present

## 2019-11-05 DIAGNOSIS — M1990 Unspecified osteoarthritis, unspecified site: Secondary | ICD-10-CM | POA: Diagnosis present

## 2019-11-05 DIAGNOSIS — J432 Centrilobular emphysema: Secondary | ICD-10-CM | POA: Diagnosis present

## 2019-11-05 DIAGNOSIS — G8929 Other chronic pain: Secondary | ICD-10-CM | POA: Diagnosis present

## 2019-11-05 DIAGNOSIS — K5900 Constipation, unspecified: Secondary | ICD-10-CM | POA: Diagnosis present

## 2019-11-05 DIAGNOSIS — Z808 Family history of malignant neoplasm of other organs or systems: Secondary | ICD-10-CM | POA: Diagnosis not present

## 2019-11-05 DIAGNOSIS — Z8041 Family history of malignant neoplasm of ovary: Secondary | ICD-10-CM

## 2019-11-05 DIAGNOSIS — Z8042 Family history of malignant neoplasm of prostate: Secondary | ICD-10-CM

## 2019-11-05 DIAGNOSIS — R0602 Shortness of breath: Secondary | ICD-10-CM | POA: Diagnosis present

## 2019-11-05 DIAGNOSIS — J9621 Acute and chronic respiratory failure with hypoxia: Principal | ICD-10-CM | POA: Diagnosis present

## 2019-11-05 DIAGNOSIS — Z923 Personal history of irradiation: Secondary | ICD-10-CM

## 2019-11-05 DIAGNOSIS — I252 Old myocardial infarction: Secondary | ICD-10-CM

## 2019-11-05 DIAGNOSIS — Z5111 Encounter for antineoplastic chemotherapy: Secondary | ICD-10-CM

## 2019-11-05 DIAGNOSIS — R0603 Acute respiratory distress: Secondary | ICD-10-CM

## 2019-11-05 DIAGNOSIS — Z85118 Personal history of other malignant neoplasm of bronchus and lung: Secondary | ICD-10-CM | POA: Diagnosis not present

## 2019-11-05 DIAGNOSIS — Z801 Family history of malignant neoplasm of trachea, bronchus and lung: Secondary | ICD-10-CM

## 2019-11-05 DIAGNOSIS — C3491 Malignant neoplasm of unspecified part of right bronchus or lung: Secondary | ICD-10-CM | POA: Diagnosis present

## 2019-11-05 DIAGNOSIS — J441 Chronic obstructive pulmonary disease with (acute) exacerbation: Secondary | ICD-10-CM | POA: Diagnosis not present

## 2019-11-05 DIAGNOSIS — Z7951 Long term (current) use of inhaled steroids: Secondary | ICD-10-CM | POA: Diagnosis not present

## 2019-11-05 DIAGNOSIS — K219 Gastro-esophageal reflux disease without esophagitis: Secondary | ICD-10-CM | POA: Diagnosis present

## 2019-11-05 DIAGNOSIS — Z87891 Personal history of nicotine dependence: Secondary | ICD-10-CM | POA: Diagnosis not present

## 2019-11-05 DIAGNOSIS — Z9981 Dependence on supplemental oxygen: Secondary | ICD-10-CM | POA: Diagnosis not present

## 2019-11-05 DIAGNOSIS — Z79899 Other long term (current) drug therapy: Secondary | ICD-10-CM | POA: Diagnosis not present

## 2019-11-05 DIAGNOSIS — J9622 Acute and chronic respiratory failure with hypercapnia: Secondary | ICD-10-CM | POA: Diagnosis present

## 2019-11-05 LAB — COMPREHENSIVE METABOLIC PANEL
ALT: 17 U/L (ref 0–44)
AST: 20 U/L (ref 15–41)
Albumin: 3.9 g/dL (ref 3.5–5.0)
Alkaline Phosphatase: 181 U/L — ABNORMAL HIGH (ref 38–126)
Anion gap: 15 (ref 5–15)
BUN: 14 mg/dL (ref 8–23)
CO2: 25 mmol/L (ref 22–32)
Calcium: 9.6 mg/dL (ref 8.9–10.3)
Chloride: 102 mmol/L (ref 98–111)
Creatinine, Ser: 0.66 mg/dL (ref 0.44–1.00)
GFR calc Af Amer: >60 mL/min (ref >60)
GFR calc non Af Amer: >60 mL/min (ref >60)
Glucose, Bld: 112 mg/dL — ABNORMAL HIGH (ref 70–99)
Potassium: 4.2 mmol/L (ref 3.5–5.1)
Sodium: 142 mmol/L (ref 135–145)
Total Bilirubin: 0.6 mg/dL (ref 0.3–1.2)
Total Protein: 7.4 g/dL (ref 6.5–8.1)

## 2019-11-05 LAB — BLOOD GAS, ARTERIAL
Acid-Base Excess: 4.9 mmol/L — ABNORMAL HIGH (ref 0.0–2.0)
Bicarbonate: 28.1 mmol/L — ABNORMAL HIGH (ref 20.0–28.0)
FIO2: 21
O2 Saturation: 86.7 %
Patient temperature: 37.3
pCO2 arterial: 46.7 mmHg (ref 32.0–48.0)
pH, Arterial: 7.415 (ref 7.350–7.450)
pO2, Arterial: 53.9 mmHg — ABNORMAL LOW (ref 83.0–108.0)

## 2019-11-05 LAB — CBC
HCT: 42.5 % (ref 36.0–46.0)
Hemoglobin: 14.1 g/dL (ref 12.0–15.0)
MCH: 29.1 pg (ref 26.0–34.0)
MCHC: 33.2 g/dL (ref 30.0–36.0)
MCV: 87.8 fL (ref 80.0–100.0)
Platelets: 222 10*3/uL (ref 150–400)
RBC: 4.84 MIL/uL (ref 3.87–5.11)
RDW: 16.1 % — ABNORMAL HIGH (ref 11.5–15.5)
WBC: 10.1 10*3/uL (ref 4.0–10.5)
nRBC: 0 % (ref 0.0–0.2)

## 2019-11-05 LAB — BRAIN NATRIURETIC PEPTIDE: B Natriuretic Peptide: 47 pg/mL (ref 0.0–100.0)

## 2019-11-05 LAB — SARS CORONAVIRUS 2 BY RT PCR (HOSPITAL ORDER, PERFORMED IN ~~LOC~~ HOSPITAL LAB): SARS Coronavirus 2: NEGATIVE

## 2019-11-05 LAB — TROPONIN I (HIGH SENSITIVITY): Troponin I (High Sensitivity): 7 ng/L (ref <18)

## 2019-11-05 MED ORDER — DM-GUAIFENESIN ER 30-600 MG PO TB12
1.0000 | ORAL_TABLET | Freq: Two times a day (BID) | ORAL | Status: DC
  Administered 2019-11-05 – 2019-11-09 (×8): 1 via ORAL
  Filled 2019-11-05 (×8): qty 1

## 2019-11-05 MED ORDER — POLYETHYLENE GLYCOL 3350 17 G PO PACK: 17.0000 g | PACK | Freq: Every day | ORAL | Status: DC | PRN

## 2019-11-05 MED ORDER — ONDANSETRON HCL 4 MG/2ML IJ SOLN: 4.0000 mg | Freq: Four times a day (QID) | INTRAMUSCULAR | Status: DC | PRN

## 2019-11-05 MED ORDER — IPRATROPIUM-ALBUTEROL 0.5-2.5 (3) MG/3ML IN SOLN
3.0000 mL | RESPIRATORY_TRACT | Status: DC
  Administered 2019-11-05 – 2019-11-06 (×4): 3 mL via RESPIRATORY_TRACT
  Filled 2019-11-05 (×4): qty 3

## 2019-11-05 MED ORDER — ALBUTEROL (5 MG/ML) CONTINUOUS INHALATION SOLN
10.0000 mg | INHALATION_SOLUTION | RESPIRATORY_TRACT | Status: AC
  Administered 2019-11-05: 10 mg via RESPIRATORY_TRACT
  Filled 2019-11-05: qty 20

## 2019-11-05 MED ORDER — METHYLPREDNISOLONE SODIUM SUCC 125 MG IJ SOLR
60.0000 mg | Freq: Two times a day (BID) | INTRAMUSCULAR | Status: DC
  Administered 2019-11-06: 60 mg via INTRAVENOUS
  Filled 2019-11-05: qty 2

## 2019-11-05 MED ORDER — MORPHINE SULFATE (PF) 2 MG/ML IV SOLN
2.0000 mg | Freq: Once | INTRAVENOUS | Status: AC
  Administered 2019-11-05: 2 mg via INTRAVENOUS
  Filled 2019-11-05: qty 1

## 2019-11-05 MED ORDER — ALBUTEROL SULFATE (2.5 MG/3ML) 0.083% IN NEBU
2.5000 mg | INHALATION_SOLUTION | RESPIRATORY_TRACT | Status: DC | PRN
  Administered 2019-11-05: 2.5 mg via RESPIRATORY_TRACT
  Filled 2019-11-05 (×2): qty 3

## 2019-11-05 MED ORDER — HEPARIN SODIUM (PORCINE) 5000 UNIT/ML IJ SOLN
5000.0000 [IU] | Freq: Three times a day (TID) | INTRAMUSCULAR | Status: DC
  Administered 2019-11-05 – 2019-11-09 (×11): 5000 [IU] via SUBCUTANEOUS
  Filled 2019-11-05 (×11): qty 1

## 2019-11-05 MED ORDER — ACETAMINOPHEN 325 MG PO TABS
650.0000 mg | ORAL_TABLET | Freq: Four times a day (QID) | ORAL | Status: DC | PRN
  Administered 2019-11-06 – 2019-11-08 (×3): 650 mg via ORAL
  Filled 2019-11-05 (×3): qty 2

## 2019-11-05 MED ORDER — FLUTICASONE FUROATE-VILANTEROL 100-25 MCG/INH IN AEPB
1.0000 | INHALATION_SPRAY | Freq: Every day | RESPIRATORY_TRACT | Status: DC
  Administered 2019-11-05 – 2019-11-06 (×2): 1 via RESPIRATORY_TRACT
  Filled 2019-11-05: qty 28

## 2019-11-05 MED ORDER — METHYLPREDNISOLONE SODIUM SUCC 125 MG IJ SOLR
125.0000 mg | Freq: Once | INTRAMUSCULAR | Status: AC
  Administered 2019-11-05: 125 mg via INTRAVENOUS
  Filled 2019-11-05: qty 2

## 2019-11-05 MED ORDER — OXYCODONE-ACETAMINOPHEN 5-325 MG PO TABS
1.0000 | ORAL_TABLET | Freq: Three times a day (TID) | ORAL | Status: DC | PRN
  Administered 2019-11-06 – 2019-11-09 (×3): 1 via ORAL
  Filled 2019-11-05 (×3): qty 1

## 2019-11-05 MED ORDER — AZITHROMYCIN 250 MG PO TABS
500.0000 mg | ORAL_TABLET | Freq: Every day | ORAL | Status: DC
  Administered 2019-11-06 – 2019-11-08 (×3): 500 mg via ORAL
  Filled 2019-11-05 (×3): qty 2

## 2019-11-05 MED ORDER — CYCLOBENZAPRINE HCL 10 MG PO TABS: 10.0000 mg | ORAL_TABLET | Freq: Two times a day (BID) | ORAL | Status: DC | PRN

## 2019-11-05 MED ORDER — OXYCODONE HCL 5 MG PO TABS
5.0000 mg | ORAL_TABLET | ORAL | Status: DC | PRN
  Administered 2019-11-06 – 2019-11-07 (×2): 5 mg via ORAL
  Filled 2019-11-05 (×2): qty 1

## 2019-11-05 MED ORDER — METOPROLOL TARTRATE 25 MG PO TABS
25.0000 mg | ORAL_TABLET | Freq: Two times a day (BID) | ORAL | Status: DC
  Administered 2019-11-05 – 2019-11-09 (×8): 25 mg via ORAL
  Filled 2019-11-05 (×8): qty 1

## 2019-11-05 MED ORDER — ACETAMINOPHEN 650 MG RE SUPP: 650.0000 mg | Freq: Four times a day (QID) | RECTAL | Status: DC | PRN

## 2019-11-05 MED ORDER — PANTOPRAZOLE SODIUM 40 MG PO TBEC
40.0000 mg | DELAYED_RELEASE_TABLET | Freq: Every day | ORAL | Status: DC
  Administered 2019-11-05 – 2019-11-09 (×5): 40 mg via ORAL
  Filled 2019-11-05 (×5): qty 1

## 2019-11-05 MED ORDER — MORPHINE SULFATE (PF) 2 MG/ML IV SOLN
2.0000 mg | INTRAVENOUS | Status: DC | PRN
  Administered 2019-11-05 – 2019-11-08 (×5): 2 mg via INTRAVENOUS
  Filled 2019-11-05 (×5): qty 1

## 2019-11-05 MED ORDER — ONDANSETRON HCL 4 MG PO TABS: 4.0000 mg | ORAL_TABLET | Freq: Four times a day (QID) | ORAL | Status: DC | PRN

## 2019-11-05 MED ORDER — SODIUM CHLORIDE 0.9 % IV SOLN
500.0000 mg | INTRAVENOUS | Status: AC
  Administered 2019-11-05: 500 mg via INTRAVENOUS
  Filled 2019-11-05: qty 500

## 2019-11-05 MED ORDER — IOHEXOL 350 MG/ML SOLN
75.0000 mL | Freq: Once | INTRAVENOUS | Status: AC | PRN
  Administered 2019-11-05: 75 mL via INTRAVENOUS

## 2019-11-05 MED ORDER — ENSURE ENLIVE PO LIQD
237.0000 mL | Freq: Three times a day (TID) | ORAL | Status: DC
  Administered 2019-11-06 – 2019-11-09 (×9): 237 mL via ORAL

## 2019-11-05 NOTE — ED Notes (Signed)
Pt has hx of R lung Cancer   Recent admission to [facility]  Reports home O2 at 3L  Short of breath since last night   Here for eval   Reports stopped smoking last week

## 2019-11-05 NOTE — ED Provider Notes (Signed)
[facility] EMERGENCY DEPARTMENT Provider Note   CSN: [id number] Arrival date & time: 11/05/19  1441     History Chief Complaint  Patient presents with  . Shortness of Breath    Courtney Zuniga is a 69 y.o. female.  HPI   This patient is a 69 year old female, she has a history of COPD and unfortunately has also been diagnosed with lung cancer status post treatment with chemo and radiation.  She has a history of having a non-ST elevation MI in the past.  She has had multiple visits to the emergency department admissions to the hospital for shortness of breath related to COPD exacerbation.  Her last CT scan of the chest was performed June 29, it showed interval increase in consolidation and volume loss of the right middle lobe with continued treatment response and developing fibrosis, there was no evidence of recurrent or metastatic disease in the chest abdomen or pelvis.  The patient had been admitted to the hospital on July 8 due to shortness of breath and COPD exacerbation.  She reports that last night she got up off the couch to walk out of the room when she became severely short of breath, this is been persistent since last night, continuous, nothing makes it better or worse, it is severe and associated with coughing which has been terrible for the last couple of months.  She takes antitussive medications as well as expectorants, she has also been taking her albuterol treatments and states that she took six nebulizer treatments since last night as well as another three by the paramedic transport service that brought her to the hospital, this is not really helped her feel better.  She does not recall the last time she was on prednisone however it was clear that she was given a course of prednisone at discharge on October 23, 2019.  She denies any significant chest pain, denies swelling of the legs, denies diarrhea nausea or vomiting.  No dysuria, no fevers or chills.  Paramedics noted the  patient to be tachycardic, her sats were 96% on baseline 2 L, she was turned up to 6 L prehospital with improvement to 98%.  She has been tachycardic since they found her and slightly more tachycardic after albuterol treatments.  Past Medical History:  Diagnosis Date  . Anemia    as a young woman  . Arthritis    osteoartritis  . Asthma   . Brain tumor (benign) ([place]) 2005 [facility]   Benign  . Chronic headaches   . Chronic hip pain   . Chronic pain   . COPD (chronic obstructive pulmonary disease) ([place])   . Coronary artery disease   . Depression   . Depression 05/15/2016  . Encounter for antineoplastic chemotherapy 01/10/2016  . GERD (gastroesophageal reflux disease)   . Hypertension   . Lung cancer ([place]) dx'd 01/2016   currently on chemo and radiation   . NSTEMI (non-ST elevated myocardial infarction) ([place]) yrs ago  . On home O2    qhs 2 liters at hs and prn  . Pneumonia last time 2 yrs ago  . Shortness of breath dyspnea    with activity    Patient Active Problem List   Diagnosis Date Noted  . Chronic nonintractable headache   . Acute on chronic respiratory failure with hypoxia ([place]) 06/16/2018  . Influenza A 06/16/2018  . Severe protein-calorie malnutrition ([place]) 06/16/2018  . GERD (gastroesophageal reflux disease) 06/16/2018  . PNA (pneumonia) 05/05/2018  . Sepsis ([place]) 05/05/2018  .  Chronic respiratory failure ([place]) 05/05/2018  . Encounter for antineoplastic immunotherapy 04/23/2017  . Depression 05/15/2016  . Adenocarcinoma of right lung, stage 3 ([place]) 01/10/2016  . Encounter for antineoplastic chemotherapy 01/10/2016  . Lung mass 01/03/2016  . Arthritis   . Subepithelial gastric mass   . Mucosal abnormality of stomach   . Abdominal pain 08/08/2015  . Abnormal CT of the abdomen 08/08/2015  . Influenza with pneumonia 06/27/2014  . Hypoxia 06/26/2014  . COPD with acute exacerbation ([place]) 06/26/2014  . CAP (community acquired pneumonia)   . Chest pain, rule out acute  myocardial infarction   . Asthma, chronic   . Chest pain 02/16/2014  . HTN (hypertension) 02/16/2014  . DDD (degenerative disc disease), lumbar 02/04/2011  . Lumbar herniated disc 01/09/2011    Past Surgical History:  Procedure Laterality Date  . CHOLECYSTECTOMY    . COLONOSCOPY  2015   Results requested from [facility]  . COLONOSCOPY    . ESOPHAGOGASTRODUODENOSCOPY N/A 08/14/2015   Procedure: ESOPHAGOGASTRODUODENOSCOPY (EGD);  Surgeon: Daneil Dolin, MD;  Location: [facility];  Service: Endoscopy;  Laterality: N/A;  215   . ESOPHAGOGASTRODUODENOSCOPY (EGD) WITH PROPOFOL N/A 09/13/2015   Procedure: ESOPHAGOGASTRODUODENOSCOPY (EGD) WITH PROPOFOL;  Surgeon: Milus Banister, MD;  Location: [facility] ENDOSCOPY;  Service: Endoscopy;  Laterality: N/A;  . EUS N/A 03/12/2017   Procedure: UPPER ENDOSCOPIC ULTRASOUND (EUS) RADIAL;  Surgeon: Milus Banister, MD;  Location: [facility] ENDOSCOPY;  Service: Endoscopy;  Laterality: N/A;  . TUMOR REMOVAL  2005   Benign  . UPPER ESOPHAGEAL ENDOSCOPIC ULTRASOUND (EUS)  09/13/2015   Procedure: UPPER ESOPHAGEAL ENDOSCOPIC ULTRASOUND (EUS);  Surgeon: Milus Banister, MD;  Location: [facility];  Service: Endoscopy;;  . VIDEO BRONCHOSCOPY WITH ENDOBRONCHIAL NAVIGATION N/A 12/31/2015   Procedure: VIDEO BRONCHOSCOPY WITH ENDOBRONCHIAL NAVIGATION;  Surgeon: Melrose Nakayama, MD;  Location: [facility];  Service: Thoracic;  Laterality: N/A;  . VIDEO BRONCHOSCOPY WITH ENDOBRONCHIAL ULTRASOUND N/A 11/08/2015   Procedure: VIDEO BRONCHOSCOPY WITH ENDOBRONCHIAL ULTRASOUND;  Surgeon: Ivin Poot, MD;  Location: [facility] OR;  Service: Thoracic;  Laterality: N/A;     OB History   No obstetric history on file.     Family History  Problem Relation Age of Onset  . Ovarian cancer Mother   . Lung cancer Father   . Lung cancer Brother   . Lung cancer Brother   . Prostate cancer Brother   . Brain cancer Sister        Half sister    Social History   Tobacco Use  .  Smoking status: Former Smoker    Packs/day: 1.00    Years: 38.00    Pack years: 38.00    Types: Cigarettes    Quit date: 07/26/2018    Years since quitting: 1.2  . Smokeless tobacco: Never Used  . Tobacco comment: Pt has asked doctor for med to help   Vaping Use  . Vaping Use: Never used  Substance Use Topics  . Alcohol use: No    Alcohol/week: 0.0 standard drinks  . Drug use: No    Home Medications Prior to Admission medications   Medication Sig Start Date End Date Taking? Authorizing Provider  acetaminophen (TYLENOL) 325 MG tablet Take 2 tablets (650 mg total) by mouth every 6 (six) hours as needed for mild pain, fever or headache. Patient not taking: Reported on 05/12/2019 05/08/18   Roxan Hockey, MD  albuterol (PROVENTIL HFA;VENTOLIN HFA) 108 (90 BASE) MCG/ACT inhaler Inhale 2 puffs  into the lungs every 4 (four) hours as needed for shortness of breath.     [provider]  azithromycin (ZITHROMAX) 250 MG tablet Take 1 tablet (250 mg total) by mouth daily. 10/23/19   Gherghe, Vella Redhead, MD  BREO ELLIPTA 100-25 MCG/INH AEPB Inhale 1 puff into the lungs daily.  04/30/17   [provider]  Cholecalciferol (VITAMIN D3) 125 MCG (5000 UT) CAPS Take 1 capsule by mouth daily. 02/17/18   [provider]  cyclobenzaprine (FLEXERIL) 10 MG tablet Take 1 tablet (10 mg total) by mouth 2 (two) times daily. *MAy take one additional tablet as needed for muscle spasms Patient not taking: Reported on 10/20/2019 05/08/18   Roxan Hockey, MD  dextromethorphan-guaiFENesin [facility] DM) 30-600 MG 12hr tablet Take 1 tablet by mouth 2 (two) times daily. Patient not taking: Reported on 10/20/2019 06/18/18   Barton Dubois, MD  diclofenac sodium (VOLTAREN) 1 % GEL Apply topically 4 (four) times daily as needed (massge gel into affected area(s) as needed for pain).  08/11/17   [provider]  ENSURE (ENSURE) Take 237 mLs by mouth 3 (three) times daily between meals.    [provider]  ipratropium-albuterol (DUONEB) 0.5-2.5 (3) MG/3ML SOLN Take 3 mLs by nebulization 3 (three) times daily. 05/08/18   Roxan Hockey, MD  methocarbamol (ROBAXIN) 500 MG tablet Take 1 tablet (500 mg total) by mouth every 8 (eight) hours as needed for muscle spasms. 06/15/19   Maudie Flakes, MD  metoprolol tartrate (LOPRESSOR) 25 MG tablet Take 1 tablet (25 mg total) by mouth 2 (two) times daily. 05/08/18   Roxan Hockey, MD  nicotine (NICODERM CQ - DOSED IN MG/24 HOURS) 14 mg/24hr patch Place 1 patch (14 mg total) onto the skin daily. 05/09/18   Roxan Hockey, MD  oxyCODONE-acetaminophen (PERCOCET/ROXICET) 5-325 MG tablet Take 1 tablet by mouth every 8 (eight) hours as needed for moderate pain. Patient not taking: Reported on 05/12/2019 05/08/18   Roxan Hockey, MD  pantoprazole (PROTONIX) 40 MG tablet Take 1 tablet (40 mg total) by mouth daily. 05/08/18   Roxan Hockey, MD  predniSONE (DELTASONE) 10 MG tablet Take 1 tablet (10 mg total) by mouth daily. 4 tablets daily x 3 days then 3 daily x 3 days then 2 daily x 3 days then 1 daily x 3 days 10/23/19   Caren Griffins, MD  ROBITUSSIN 12 HOUR COUGH 30 MG/5ML liquid Take 30 mg by mouth every 4 (four) hours as needed for cough.  01/25/18   [provider]  topiramate (TOPAMAX) 25 MG tablet Take 1 tablet by mouth daily at bedtime X 1 week; then increase to 25mg  BID. Patient not taking: Reported on 10/20/2019 06/18/18   Barton Dubois, MD    Allergies    Patient has no known allergies.  Review of Systems   Review of Systems  All other systems reviewed and are negative.   Physical Exam Updated Vital Signs Pulse (!) 126   Temp 99.2 F (37.3 C) (Oral)   Resp (!) 31   Ht 1.702 m (5\' 7" )   Wt 65.3 kg   SpO2 98%   BMI 22.55 kg/m   Physical Exam Vitals and nursing note reviewed.  Constitutional:      General: She is in acute distress.     Appearance: She is well-developed. She is ill-appearing.  HENT:     Head:  Normocephalic and atraumatic.     Mouth/Throat:     Pharynx: No oropharyngeal exudate.  Eyes:     General: No scleral icterus.       Right eye: No discharge.        Left eye: No discharge.     Conjunctiva/sclera: Conjunctivae normal.     Pupils: Pupils are equal, round, and reactive to light.  Neck:     Thyroid: No thyromegaly.     Vascular: No JVD.  Cardiovascular:     Rate and Rhythm: Regular rhythm. Tachycardia present.     Heart sounds: Normal heart sounds. No murmur heard.  No friction rub. No gallop.   Pulmonary:     Effort: Tachypnea, accessory muscle usage and respiratory distress present.     Breath sounds: Normal breath sounds. No wheezing or rales.  Abdominal:     General: Bowel sounds are normal. There is no distension.     Palpations: Abdomen is soft. There is no mass.     Tenderness: There is no abdominal tenderness.  Musculoskeletal:        General: No tenderness. Normal range of motion.     Cervical back: Normal range of motion and neck supple.  Lymphadenopathy:     Cervical: No cervical adenopathy.  Skin:    General: Skin is warm and dry.     Findings: No erythema or rash.  Neurological:     Mental Status: She is alert.     Coordination: Coordination normal.  Psychiatric:        Behavior: Behavior normal.     ED Results / Procedures / Treatments   Labs (all labs ordered are listed, but only abnormal results are displayed) Labs Reviewed  SARS CORONAVIRUS 2 BY RT PCR (HOSPITAL ORDER, [facility])  CBC  COMPREHENSIVE METABOLIC PANEL  BRAIN NATRIURETIC PEPTIDE  BLOOD GAS, ARTERIAL  TROPONIN I (HIGH SENSITIVITY)    EKG None  Radiology No results found.  Procedures .Critical Care Performed by: Noemi Chapel, MD Authorized by: Noemi Chapel, MD   Critical care provider statement:    Critical care time (minutes):  35   Critical care time was exclusive of:  Separately billable procedures and treating other patients and  teaching time   Critical care was necessary to treat or prevent imminent or life-threatening deterioration of the following conditions:  Respiratory failure   Critical care was time spent personally by me on the following activities:  Blood draw for specimens, development of treatment plan with patient or surrogate, discussions with consultants, evaluation of patient's response to treatment, examination of patient, obtaining history from patient or surrogate, ordering and performing treatments and interventions, ordering and review of laboratory studies, ordering and review of radiographic studies, pulse oximetry, re-evaluation of patient's condition and review of old charts   (including critical care time)  Medications Ordered in ED Medications  methylPREDNISolone sodium succinate (SOLU-MEDROL) 125 mg/2 mL injection 125 mg (has no administration in time range)  albuterol (PROVENTIL,VENTOLIN) solution continuous neb (has no administration in time range)    ED Course  I have reviewed the triage vital signs and the nursing notes.  Pertinent labs & imaging results that were available during my care of the patient were reviewed by me and considered in my medical decision making (see chart for details).    MDM Rules/Calculators/A&P                          The patient has decreased lung sounds bilaterally, she has what appears to be severe COPD at  baseline and is currently appearing to be in an exacerbation.  Given her history of COPD I would be concerned about pneumothorax causing acute onset of worsening shortness of breath, she has no history of congestive heart failure, last echocardiogram was 60 to 65%, this was in 2017.  We will proceed with continuous nebulizer, ABG, steroids, chest x-ray, the patient will likely need to be admitted if she does not have significant improvement due to her degree of respiratory distress  This patient has a CT scan which does not show pulmonary embolism, does  show severe bullous emphysema.  She has received multiple nebs, continuous nebs, continues to be in respiratory distress and will need admission to the hospital.  Discussed with hospitalist who has been kind enough to admit this patient to the hospital.  Critical care provided  ABG does not reveal significant hypercapnia or acidosis  Courtney Zuniga was evaluated in Emergency Department on 11/05/2019 for the symptoms described in the history of present illness. She was evaluated in the context of the global COVID-19 pandemic, which necessitated consideration that the patient might be at risk for infection with the SARS-CoV-2 virus that causes COVID-19. Institutional protocols and algorithms that pertain to the evaluation of patients at risk for COVID-19 are in a state of rapid change based on information released by regulatory bodies including the CDC and federal and state organizations. These policies and algorithms were followed during the patient's care in the ED.   Final Clinical Impression(s) / ED Diagnoses Final diagnoses:  Acute respiratory distress  COPD exacerbation (HCC)      Noemi Chapel, MD 11/05/19 1918

## 2019-11-05 NOTE — H&P (Signed)
TRH H&P    Patient Demographics:    Courtney Zuniga, is a 69 y.o. female  MRN: [id number]  DOB - [birth year]  Admit Date - 11/05/2019  Referring MD/NP/PA: Dr. Sabra Heck  Outpatient Primary MD for the patient is Barry Dienes, NP  Patient coming from: Home  Chief complaint- Dyspnea   HPI:    Courtney Zuniga  is a 69 y.o. female, with history of right-sided recurrent non-small cell lung cancer, COPD, hypertension, GERD, and more presents to the ED with a chief complaint of shortness of breath.  Patient has had frequent admissions for COPD exacerbation.  Her last admission was July 2 to July 8.  At that time she had already failed outpatient therapy with oral prednisone and doxy.  She was admitted for COPD exacerbation and started on IV steroids, nebs, antibiotics, and discharged with a prednisone taper.  Patient reports that since she finished the prednisone taper she started feeling badly again.  She started to acutely feel worse yesterday.  She reports that this was an increase in her dyspnea.  She was trying her nebulizers with no relief.  She could not sleep last night.  Her dyspnea is worse with exertion and worse with positional changes.  It is better with rest.  She has a dry cough.  She notices wheezing.  Patient denies fever and diaphoresis.  She reports that she has been taking all her medications as prescribed.  She is on 3 L oxygen supplementation at baseline.  Patient does note associated chest pain.  She reports that the chest pain is worse with breathing.  It feels crampy.  It has gone away since she has been in the hospital.  This is likely accessory muscle fatigue.  Patient admits to loss of appetite.  She reports she has not eaten in 1 to 2 days.  Patient is fully vaccinated for Covid.  Lastly patient complains of constipation.  She took a laxative yesterday and had a bowel movement.  ED highlights CT A= no  pulmonary embolism ABG equals pH 7.415, PO2 53.9, PCO2 46.7 White blood cell count 10.1 CHEM panel is unremarkable BNP is [address] 7 Chest x-ray shows emphysema with hyperinflation EKG shows heart rate 118, QTc 421, sinus tach with baseline artifact, similar to previous Vitals at time of admission: Temp nine 9.2, blood pressure 127/82, heart rate 125, respiratory 38, O2 saturation 96% on 4 L nasal cannula    Review of systems:    Review of Systems  Constitutional: Positive for malaise/fatigue. Negative for chills, diaphoresis and fever.  HENT: Positive for congestion and sinus pain. Negative for sore throat.   Eyes: Negative for blurred vision and double vision.  Respiratory: Positive for cough, shortness of breath and wheezing. Negative for hemoptysis and sputum production.   Cardiovascular: Positive for chest pain. Negative for palpitations and leg swelling.  Gastrointestinal: Positive for constipation. Negative for abdominal pain, blood in stool, nausea and vomiting.  Genitourinary: Negative for dysuria, frequency and urgency.  Musculoskeletal: Negative for myalgias.  Neurological: Positive for weakness. Negative for  dizziness and focal weakness.  Psychiatric/Behavioral: Negative for substance abuse.    All other systems reviewed and are negative.    Past History of the following :    Past Medical History:  Diagnosis Date   Anemia    as a young woman   Arthritis    osteoartritis   Asthma    Brain tumor (benign) ([place]) 2005 [facility]   Benign   Chronic headaches    Chronic hip pain    Chronic pain    COPD (chronic obstructive pulmonary disease) ([place])    Coronary artery disease    Depression    Depression 05/15/2016   Encounter for antineoplastic chemotherapy 01/10/2016   GERD (gastroesophageal reflux disease)    Hypertension    Lung cancer ([place]) dx'd 01/2016   currently on chemo and radiation    NSTEMI (non-ST elevated myocardial infarction) ([place]) yrs  ago   On home O2    qhs 2 liters at hs and prn   Pneumonia last time 2 yrs ago   Shortness of breath dyspnea    with activity      Past Surgical History:  Procedure Laterality Date   CHOLECYSTECTOMY     COLONOSCOPY  2015   Results requested from [facility]   COLONOSCOPY     ESOPHAGOGASTRODUODENOSCOPY N/A 08/14/2015   Procedure: ESOPHAGOGASTRODUODENOSCOPY (EGD);  Surgeon: Daneil Dolin, MD;  [facility];  Service: Endoscopy;  Laterality: N/A;  215    ESOPHAGOGASTRODUODENOSCOPY (EGD) WITH PROPOFOL N/A 09/13/2015   Procedure: ESOPHAGOGASTRODUODENOSCOPY (EGD) WITH PROPOFOL;  Surgeon: Milus Banister, MD;  Location: [facility];  Service: Endoscopy;  Laterality: N/A;   EUS N/A 03/12/2017   Procedure: UPPER ENDOSCOPIC ULTRASOUND (EUS) RADIAL;  Surgeon: Milus Banister, MD;  Location: [facility];  Service: Endoscopy;  Laterality: N/A;   TUMOR REMOVAL  2005   Benign   UPPER ESOPHAGEAL ENDOSCOPIC ULTRASOUND (EUS)  09/13/2015   Procedure: UPPER ESOPHAGEAL ENDOSCOPIC ULTRASOUND (EUS);  Surgeon: Milus Banister, MD;  Location: [facility];  Service: Endoscopy;;   VIDEO BRONCHOSCOPY WITH ENDOBRONCHIAL NAVIGATION N/A 12/31/2015   Procedure: VIDEO BRONCHOSCOPY WITH ENDOBRONCHIAL NAVIGATION;  Surgeon: Melrose Nakayama, MD;  Location: [facility];  Service: Thoracic;  Laterality: N/A;   VIDEO BRONCHOSCOPY WITH ENDOBRONCHIAL ULTRASOUND N/A 11/08/2015   Procedure: VIDEO BRONCHOSCOPY WITH ENDOBRONCHIAL ULTRASOUND;  Surgeon: Ivin Poot, MD;  Location: [facility];  Service: Thoracic;  Laterality: N/A;      Social History:      Social History   Tobacco Use   Smoking status: Former Smoker    Packs/day: 1.00    Years: 38.00    Pack years: 38.00    Types: Cigarettes    Quit date: 07/26/2018    Years since quitting: 1.2   Smokeless tobacco: Never Used   Tobacco comment: Pt has asked doctor for med to help   Substance Use Topics   Alcohol use: No     Alcohol/week: 0.0 standard drinks       Family History :     Family History  Problem Relation Age of Onset   Ovarian cancer Mother    Lung cancer Father    Lung cancer Brother    Lung cancer Brother    Prostate cancer Brother    Brain cancer Sister        Half sister      Home Medications:   Prior to Admission medications   Medication Sig Start Date End Date Taking? Authorizing Provider  albuterol (PROVENTIL HFA;VENTOLIN HFA) 108 (90 BASE) MCG/ACT inhaler Inhale 2 puffs into the lungs every 4 (four) hours as needed for shortness of breath.    Yes [provider]  BREO ELLIPTA 100-25 MCG/INH AEPB Inhale 1 puff into the lungs daily.  04/30/17  Yes [provider]  Cholecalciferol (VITAMIN D3) 125 MCG (5000 UT) CAPS Take 1 capsule by mouth daily. 02/17/18  Yes [provider]  diclofenac sodium (VOLTAREN) 1 % GEL Apply topically 4 (four) times daily as needed (massge gel into affected area(s) as needed for pain).  08/11/17  Yes [provider]  ipratropium-albuterol (DUONEB) 0.5-2.5 (3) MG/3ML SOLN Take 3 mLs by nebulization 3 (three) times daily. 05/08/18  Yes Emokpae, Courage, MD  metoprolol tartrate (LOPRESSOR) 25 MG tablet Take 1 tablet (25 mg total) by mouth 2 (two) times daily. 05/08/18  Yes Emokpae, Courage, MD  pantoprazole (PROTONIX) 40 MG tablet Take 1 tablet (40 mg total) by mouth daily. 05/08/18  Yes Roxan Hockey, MD  acetaminophen (TYLENOL) 325 MG tablet Take 2 tablets (650 mg total) by mouth every 6 (six) hours as needed for mild pain, fever or headache. Patient not taking: Reported on 05/12/2019 05/08/18   Roxan Hockey, MD  azithromycin (ZITHROMAX) 250 MG tablet Take 1 tablet (250 mg total) by mouth daily. 10/23/19   Caren Griffins, MD  cyclobenzaprine (FLEXERIL) 10 MG tablet Take 1 tablet (10 mg total) by mouth 2 (two) times daily. *MAy take one additional tablet as needed for muscle spasms Patient not taking: Reported on  10/20/2019 05/08/18   Roxan Hockey, MD  [facility] DM) 30-600 MG 12hr tablet Take 1 tablet by mouth 2 (two) times daily. Patient not taking: Reported on 10/20/2019 06/18/18   Barton Dubois, MD  ENSURE (ENSURE) Take 237 mLs by mouth 3 (three) times daily between meals.    [provider]  methocarbamol (ROBAXIN) 500 MG tablet Take 1 tablet (500 mg total) by mouth every 8 (eight) hours as needed for muscle spasms. 06/15/19   Maudie Flakes, MD  nicotine (NICODERM CQ - DOSED IN MG/24 HOURS) 14 mg/24hr patch Place 1 patch (14 mg total) onto the skin daily. 05/09/18   Roxan Hockey, MD  oxyCODONE-acetaminophen (PERCOCET/ROXICET) 5-325 MG tablet Take 1 tablet by mouth every 8 (eight) hours as needed for moderate pain. Patient not taking: Reported on 05/12/2019 05/08/18   Roxan Hockey, MD  predniSONE (DELTASONE) 10 MG tablet Take 1 tablet (10 mg total) by mouth daily. 4 tablets daily x 3 days then 3 daily x 3 days then 2 daily x 3 days then 1 daily x 3 days 10/23/19   Caren Griffins, MD  ROBITUSSIN 12 HOUR COUGH 30 MG/5ML liquid Take 30 mg by mouth every 4 (four) hours as needed for cough.  01/25/18   [provider]  topiramate (TOPAMAX) 25 MG tablet Take 1 tablet by mouth daily at bedtime X 1 week; then increase to 25mg  BID. Patient not taking: Reported on 10/20/2019 06/18/18   Barton Dubois, MD     Allergies:    No Known Allergies   Physical Exam:   Vitals  Blood pressure (!) 131/86, pulse (!) 120, temperature 99.2 F (37.3 C), temperature source Oral, resp. rate (!) 25, height 5\' 7"  (1.702 m), weight 65.3 kg, SpO2 98 %.  1.  General: Lying supine in bed with increased work of breathing  2. Psychiatric: Mood and behavior normal for situation  3. Neurologic: Moves all 4 extremities voluntarily At  baseline No focal deficits on limited exam  4. HEENMT:  Head is atraumatic normocephalic Left eye twitching since 2005 -related to previous  benign brain tumor per patient Mucous membranes are moist Neck is cachectic Trachea is midline  5. Respiratory : Inspiratory and expiratory wheezing bilaterally Accessory muscle use Difficulty finishing a sentence without stopping for air  6. Cardiovascular : Heart rate is tachycardic, rhythm is regular  7. Gastrointestinal:  Abdomen is soft, nondistended, nontender to palpation  8. Skin:  No acute lesions on limited skin exam  9.Musculoskeletal:  No peripheral edema or deformity    Data Review:    CBC Recent Labs  Lab 11/05/19 1526  WBC 10.1  HGB 14.1  HCT 42.5  PLT 222  MCV 87.8  MCH 29.1  MCHC 33.2  RDW 16.1*   ------------------------------------------------------------------------------------------------------------------  Results for orders placed or performed during the hospital encounter of 11/05/19 (from the past 48 hour(s))  SARS Coronavirus 2 by RT PCR (hospital order, performed in [facility] hospital lab) Nasopharyngeal Nasopharyngeal Swab     Status: None   Collection Time: 11/05/19  3:08 PM   Specimen: Nasopharyngeal Swab  Result Value Ref Range   SARS Coronavirus 2 NEGATIVE NEGATIVE    Comment: (NOTE) SARS-CoV-2 target nucleic acids are NOT DETECTED.  The SARS-CoV-2 RNA is generally detectable in upper and lower respiratory specimens during the acute phase of infection. The lowest concentration of SARS-CoV-2 viral copies this assay can detect is 250 copies / mL. A negative result does not preclude SARS-CoV-2 infection and should not be used as the sole basis for treatment or other patient management decisions.  A negative result may occur with improper specimen collection / handling, submission of specimen other than nasopharyngeal swab, presence of viral mutation(s) within the areas targeted by this assay, and inadequate number of viral copies (<250 copies / mL). A negative result must be combined with clinical observations, patient history,  and epidemiological information.  Fact Sheet for Patients:   StrictlyIdeas.no  Fact Sheet for Healthcare Providers: BankingDealers.co.za  This test is not yet approved or  cleared by the [place] FDA and has been authorized for detection and/or diagnosis of SARS-CoV-2 by FDA under an Emergency Use Authorization (EUA).  This EUA will remain in effect (meaning this test can be used) for the duration of the COVID-19 declaration under Section 564(b)(1) of the Act, 21 U.S.C. section 360bbb-3(b)(1), unless the authorization is terminated or revoked sooner.  Performed at [facility], [address] [place], [place] [address]   CBC     Status: Abnormal   Collection Time: 11/05/19  3:26 PM  Result Value Ref Range   WBC 10.1 4.0 - 10.5 K/uL   RBC 4.84 3.87 - 5.11 MIL/uL   Hemoglobin 14.1 12.0 - 15.0 g/dL   HCT 42.5 36 - 46 %   MCV 87.8 80.0 - 100.0 fL   MCH 29.1 26.0 - 34.0 pg   MCHC 33.2 30.0 - 36.0 g/dL   RDW 16.1 (H) 11.5 - 15.5 %   Platelets 222 150 - 400 K/uL   nRBC 0.0 0.0 - 0.2 %    Comment: Performed at [facility], [address] [place], [place] [address]  Comprehensive metabolic panel     Status: Abnormal   Collection Time: 11/05/19  3:26 PM  Result Value Ref Range   Sodium 142 135 - 145 mmol/L   Potassium 4.2 3.5 - 5.1 mmol/L   Chloride 102 98 - 111 mmol/L  CO2 25 22 - 32 mmol/L   Glucose, Bld 112 (H) 70 - 99 mg/dL    Comment: Glucose reference range applies only to samples taken after fasting for at least 8 hours.   BUN 14 8 - 23 mg/dL   Creatinine, Ser 0.66 0.44 - 1.00 mg/dL   Calcium 9.6 8.9 - 10.3 mg/dL   Total Protein 7.4 6.5 - 8.1 g/dL   Albumin 3.9 3.5 - 5.0 g/dL   AST 20 15 - 41 U/L   ALT 17 0 - 44 U/L   Alkaline Phosphatase 181 (H) 38 - 126 U/L   Total Bilirubin 0.6 0.3 - 1.2 mg/dL   GFR calc non Af Amer >60 >60 mL/min   GFR calc Af Amer >60 >60 mL/min   Anion gap 15 5 - 15    Comment: Performed at  [facility], [address], [place], [place] [address]  Troponin I (High Sensitivity)     Status: None   Collection Time: 11/05/19  3:26 PM  Result Value Ref Range   Troponin I (High Sensitivity) 7 <18 ng/L    Comment: (NOTE) Elevated high sensitivity troponin I (hsTnI) values and significant  changes across serial measurements may suggest ACS but many other  chronic and acute conditions are known to elevate hsTnI results.  Refer to the "Links" section for chest pain algorithms and additional  guidance. Performed at [facility], [address], [place], [place] [address]   Brain natriuretic peptide     Status: None   Collection Time: 11/05/19  3:26 PM  Result Value Ref Range   B Natriuretic Peptide 47.0 0.0 - 100.0 pg/mL    Comment: Performed at [facility], [address], [place], [place] [address]  Blood gas, arterial     Status: Abnormal   Collection Time: 11/05/19  3:50 PM  Result Value Ref Range   FIO2 21.00    pH, Arterial 7.415 7.35 - 7.45   pCO2 arterial 46.7 32 - 48 mmHg   pO2, Arterial 53.9 (L) 83 - 108 mmHg   Bicarbonate 28.1 (H) 20.0 - 28.0 mmol/L   Acid-Base Excess 4.9 (H) 0.0 - 2.0 mmol/L   O2 Saturation 86.7 %   Patient temperature 37.3    Allens test (pass/fail) PASS PASS    Comment: Performed at [facility], [address] [place], [place] [address]    Chemistries  Recent Labs  Lab 11/05/19 1526  NA 142  K 4.2  CL 102  CO2 25  GLUCOSE 112*  BUN 14  CREATININE 0.66  CALCIUM 9.6  AST 20  ALT 17  ALKPHOS 181*  BILITOT 0.6   ------------------------------------------------------------------------------------------------------------------  ------------------------------------------------------------------------------------------------------------------ GFR: Estimated Creatinine Clearance: 63.6 mL/min (by C-G formula based on SCr of 0.66 mg/dL). Liver Function Tests: Recent Labs  Lab 11/05/19 1526  AST 20  ALT 17  ALKPHOS 181*  BILITOT 0.6    PROT 7.4  ALBUMIN 3.9   No results for input(s): LIPASE, AMYLASE in the last 168 hours. No results for input(s): AMMONIA in the last 168 hours. Coagulation Profile: No results for input(s): INR, PROTIME in the last 168 hours. Cardiac Enzymes: No results for input(s): CKTOTAL, CKMB, CKMBINDEX, TROPONINI in the last 168 hours. BNP (last 3 results) No results for input(s): PROBNP in the last 8760 hours. HbA1C: No results for input(s): HGBA1C in the last 72 hours. CBG: No results for input(s): GLUCAP in the last 168 hours. Lipid Profile: No results for input(s): CHOL, HDL, LDLCALC, TRIG, CHOLHDL,  LDLDIRECT in the last 72 hours. Thyroid Function Tests: No results for input(s): TSH, T4TOTAL, FREET4, T3FREE, THYROIDAB in the last 72 hours. Anemia Panel: No results for input(s): VITAMINB12, FOLATE, FERRITIN, TIBC, IRON, RETICCTPCT in the last 72 hours.  --------------------------------------------------------------------------------------------------------------- Urine analysis:    Component Value Date/Time   COLORURINE YELLOW 05/05/2018 [place] 05/05/2018 1539   LABSPEC 1.006 05/05/2018 1539   PHURINE 6.0 05/05/2018 1539   GLUCOSEU NEGATIVE 05/05/2018 1539   HGBUR NEGATIVE 05/05/2018 1539   BILIRUBINUR NEGATIVE 05/05/2018 1539   KETONESUR NEGATIVE 05/05/2018 1539   PROTEINUR NEGATIVE 05/05/2018 1539   UROBILINOGEN 0.2 03/31/2014 1406   NITRITE NEGATIVE 05/05/2018 1539   LEUKOCYTESUR NEGATIVE 05/05/2018 1539      Imaging Results:    CT Angio Chest PE W and/or Wo Contrast  Result Date: 11/05/2019 CLINICAL DATA:  Pulmonary embolism, dyspnea, tachycardia, lung cancer EXAM: CT ANGIOGRAPHY CHEST WITH CONTRAST TECHNIQUE: Multidetector CT imaging of the chest was performed using the standard protocol during bolus administration of intravenous contrast. Multiplanar CT image reconstructions and MIPs were obtained to evaluate the vascular anatomy. CONTRAST:  35mL  OMNIPAQUE IOHEXOL 350 MG/ML SOLN COMPARISON:  10/11/2019 FINDINGS: Cardiovascular: Satisfactory opacification of the pulmonary arteries to the segmental level. No evidence of pulmonary embolism. There is central pulmonary arterial enlargement in keeping with pulmonary artery hypertension. Left ventricular hypertrophy is noted. Global cardiac size is within normal limits. Moderate coronary artery calcification noted. Mild atherosclerotic calcification is seen within the aortic arch. No aneurysm. Arch vasculature appears widely patent proximally. Mediastinum/Nodes: No pathologic thoracic adenopathy.Asymmetric soft tissue within the upper outer quadrant of the right breast is not well assessed on this examination, but appears stable since examinations dating as far back as 12/07/2017. Lungs/Pleura: Severe centrilobular emphysema with associated pulmonary hyperinflation is again identified. Chronic right middle lobe collapse is unchanged. There has developed progressive peribronchial nodularity within the anterior left upper lobe possibly infectious in the acute setting. There is progressive bronchial wall thickening, best noted within the right lower lobe suggesting acute on chronic airway inflammation. No central obstructing lesion. Upper Abdomen: Unremarkable Musculoskeletal: No acute bone abnormality. Review of the MIP images confirms the above findings. IMPRESSION: No pulmonary embolism. Severe centrilobular emphysema. Superimposed new peribronchial nodularity and acute on chronic airway inflammation most in keeping with acute infection or inflammation. Left ventricular hypertrophy. Morphologic changes in keeping with pulmonary arterial hypertension. Aortic Atherosclerosis (ICD10-I70.0) and Emphysema (ICD10-J43.9). Electronically Signed   By: Fidela Salisbury MD   On: 11/05/2019 19:03   DG Chest Portable 1 View  Result Date: 11/05/2019 CLINICAL DATA:  Dyspnea EXAM: PORTABLE CHEST 1 VIEW COMPARISON:  10/20/2019  FINDINGS: Changes of bullous emphysema are again identified with prominent bulla formation within the right apex and hyperinflation of the lungs. Mild bibasilar atelectasis. Stable right infrahilar opacity related to middle lobe collapse and consolidation, better seen on CT examination of 10/11/2019. No focal pulmonary nodule or infiltrate. No pneumothorax or pleural effusion. Cardiac size within normal limits. IMPRESSION: Stable pulmonary hyperinflation secondary to bullous emphysema. Unchanged right infrahilar opacity representing chronic right middle lobe collapse. No acute cardiopulmonary process identified. Emphysema (ICD10-J43.9). Electronically Signed   By: Fidela Salisbury MD   On: 11/05/2019 15:31    My personal review of EKG: Rhythm sinus tachycardia, Rate 118 /min, QTc 421 ,no Acute ST changes   Assessment & Plan:    Active Problems:   COPD exacerbation ([place])  1. COPD exacerbation 1. Dyspnea, wheezing, no relief with home nebs/inhalers  2. Solu-Medrol 125 given in the ED 3. Continuous neb given in the ED 4. Continue duo nebs, as needed albuterol 5. Continue Solu-Medrol twice daily 6. Continue home medications 7. Monitor pulse ox 8. Admitting to stepdown secondary to oxygen requirement, accessory muscle use, general work of breathing -high risk of decompensation overnight 2. Acute hypoxic respiratory failure without hypercapnia 1. PO2 53.9 2. Secondary to COPD exacerbation 3. CT chest reveals no PE 4. Chest x-ray shows emphysema with hyperinflation, right infrahilar opacity which is likely chronic right middle lobe collapse secondary to malignancy 5. Continue to monitor 6. Wean off O2 down to baseline 3 L nasal cannula as tolerated 3. Recurrent non-small cell lung cancer 1. Prior therapy with chemo: Carboplatin and paclitaxel 2. Current therapy is nivolumab every 4 weeks 4. Hypertension 1. Continue metoprolol 5. Sinus tachycardia 1. Secondary to hypoxia 2. Continue treatments  as above, monitor on telemetry   DVT Prophylaxis-  heparin - SCDs   AM Labs Ordered, also please review Full Orders  Family Communication: No family at bedside Code Status:  FULL  Admission status: Inpatient :The appropriate admission status for this patient is INPATIENT. Inpatient status is judged to be reasonable and necessary in order to provide the required intensity of service to ensure the patient's safety. The patient's presenting symptoms, physical exam findings, and initial radiographic and laboratory data in the context of their chronic comorbidities is felt to place them at high risk for further clinical deterioration. Furthermore, it is not anticipated that the patient will be medically stable for discharge from the hospital within 2 midnights of admission. The following factors support the admission status of inpatient.     The patient's presenting symptoms include dyspnea The worrisome physical exam findings include hypoxia The initial radiographic and laboratory data are worrisome because of PO2 53.9 The chronic co-morbidities include COPD and non small cell lung cancer        I certify that at the point of admission it is my clinical judgment that the patient will require inpatient hospital care spanning beyond 2 midnights from the point of admission due to high intensity of service, high risk for further deterioration and high frequency of surveillance required.  Time spent in minutes : [place]

## 2019-11-05 NOTE — ED Triage Notes (Signed)
[facility]-3 nebs- IV R hand 96 per cent on home 3 L- 98 per cent afterward  Reports stopped smoking last week  SOB since last night

## 2019-11-05 NOTE — Progress Notes (Signed)
Patient placed on 4L [place] post CAT . HR 129, RR 35 ans SpO2 of 96%

## 2019-11-05 NOTE — ED Notes (Signed)
Pt placed on purewick 

## 2019-11-05 NOTE — ED Notes (Signed)
Report Heather, RN (1-7)

## 2019-11-05 NOTE — ED Notes (Signed)
Patient on 12 lead at this time. EKG done and seen by Dr Roderic Palau

## 2019-11-05 NOTE — ED Notes (Signed)
Meal provided with Dr Jerilynn Mages permission

## 2019-11-06 DIAGNOSIS — J9622 Acute and chronic respiratory failure with hypercapnia: Secondary | ICD-10-CM

## 2019-11-06 DIAGNOSIS — J9621 Acute and chronic respiratory failure with hypoxia: Principal | ICD-10-CM

## 2019-11-06 LAB — COMPREHENSIVE METABOLIC PANEL
ALT: 16 U/L (ref 0–44)
AST: 18 U/L (ref 15–41)
Albumin: 3.5 g/dL (ref 3.5–5.0)
Alkaline Phosphatase: 146 U/L — ABNORMAL HIGH (ref 38–126)
Anion gap: 10 (ref 5–15)
BUN: 21 mg/dL (ref 8–23)
CO2: 31 mmol/L (ref 22–32)
Calcium: 9.4 mg/dL (ref 8.9–10.3)
Chloride: 101 mmol/L (ref 98–111)
Creatinine, Ser: 0.65 mg/dL (ref 0.44–1.00)
GFR calc Af Amer: >60 mL/min (ref >60)
GFR calc non Af Amer: >60 mL/min (ref >60)
Glucose, Bld: 133 mg/dL — ABNORMAL HIGH (ref 70–99)
Potassium: 4.9 mmol/L (ref 3.5–5.1)
Sodium: 142 mmol/L (ref 135–145)
Total Bilirubin: 0.3 mg/dL (ref 0.3–1.2)
Total Protein: 6.8 g/dL (ref 6.5–8.1)

## 2019-11-06 LAB — MRSA PCR SCREENING: MRSA by PCR: NEGATIVE

## 2019-11-06 LAB — BLOOD GAS, ARTERIAL
Acid-Base Excess: 6.3 mmol/L — ABNORMAL HIGH (ref 0.0–2.0)
Bicarbonate: 28.6 mmol/L — ABNORMAL HIGH (ref 20.0–28.0)
FIO2: 32
O2 Saturation: 98.2 %
Patient temperature: 37
pCO2 arterial: 62.3 mmHg — ABNORMAL HIGH (ref 32.0–48.0)
pH, Arterial: 7.331 — ABNORMAL LOW (ref 7.350–7.450)
pO2, Arterial: 144 mmHg — ABNORMAL HIGH (ref 83.0–108.0)

## 2019-11-06 LAB — CBC WITH DIFFERENTIAL/PLATELET
Abs Immature Granulocytes: 0.04 10*3/uL (ref 0.00–0.07)
Basophils Absolute: 0 10*3/uL (ref 0.0–0.1)
Basophils Relative: 0 %
Eosinophils Absolute: 0 10*3/uL (ref 0.0–0.5)
Eosinophils Relative: 0 %
HCT: 39.4 % (ref 36.0–46.0)
Hemoglobin: 13.1 g/dL (ref 12.0–15.0)
Immature Granulocytes: 1 %
Lymphocytes Relative: 8 %
Lymphs Abs: 0.6 10*3/uL — ABNORMAL LOW (ref 0.7–4.0)
MCH: 29.3 pg (ref 26.0–34.0)
MCHC: 33.2 g/dL (ref 30.0–36.0)
MCV: 88.1 fL (ref 80.0–100.0)
Monocytes Absolute: 0.3 10*3/uL (ref 0.1–1.0)
Monocytes Relative: 4 %
Neutro Abs: 6.2 10*3/uL (ref 1.7–7.7)
Neutrophils Relative %: 87 %
Platelets: 218 10*3/uL (ref 150–400)
RBC: 4.47 MIL/uL (ref 3.87–5.11)
RDW: 15.9 % — ABNORMAL HIGH (ref 11.5–15.5)
WBC: 7.1 10*3/uL (ref 4.0–10.5)
nRBC: 0 % (ref 0.0–0.2)

## 2019-11-06 LAB — MAGNESIUM: Magnesium: 1.8 mg/dL (ref 1.7–2.4)

## 2019-11-06 MED ORDER — IPRATROPIUM-ALBUTEROL 0.5-2.5 (3) MG/3ML IN SOLN
3.0000 mL | Freq: Four times a day (QID) | RESPIRATORY_TRACT | Status: DC
  Administered 2019-11-06 – 2019-11-09 (×12): 3 mL via RESPIRATORY_TRACT
  Filled 2019-11-06 (×11): qty 3

## 2019-11-06 MED ORDER — BUDESONIDE 0.5 MG/2ML IN SUSP
0.5000 mg | Freq: Two times a day (BID) | RESPIRATORY_TRACT | Status: DC
  Administered 2019-11-06 – 2019-11-09 (×6): 0.5 mg via RESPIRATORY_TRACT
  Filled 2019-11-06 (×6): qty 2

## 2019-11-06 MED ORDER — CHLORHEXIDINE GLUCONATE CLOTH 2 % EX PADS
6.0000 | MEDICATED_PAD | Freq: Every day | CUTANEOUS | Status: DC
  Administered 2019-11-06 – 2019-11-09 (×4): 6 via TOPICAL

## 2019-11-06 MED ORDER — METHYLPREDNISOLONE SODIUM SUCC 125 MG IJ SOLR
60.0000 mg | Freq: Three times a day (TID) | INTRAMUSCULAR | Status: DC
  Administered 2019-11-06 – 2019-11-09 (×9): 60 mg via INTRAVENOUS
  Filled 2019-11-06 (×9): qty 2

## 2019-11-06 MED ORDER — ARFORMOTEROL TARTRATE 15 MCG/2ML IN NEBU
15.0000 ug | INHALATION_SOLUTION | Freq: Two times a day (BID) | RESPIRATORY_TRACT | Status: DC
  Administered 2019-11-06 – 2019-11-09 (×6): 15 ug via RESPIRATORY_TRACT
  Filled 2019-11-06 (×6): qty 2

## 2019-11-06 MED ORDER — ORAL CARE MOUTH RINSE
15.0000 mL | Freq: Two times a day (BID) | OROMUCOSAL | Status: DC
  Administered 2019-11-06 – 2019-11-09 (×7): 15 mL via OROMUCOSAL

## 2019-11-06 NOTE — Progress Notes (Signed)
PROGRESS NOTE    Courtney Zuniga  VQM:[id number] DOB: 03-02-1951 DOA: 11/05/2019   PCP: Barry Dienes, NP   Chief Complaint  Patient presents with   Shortness of Breath    Brief Narrative:  As per H&P written by Dr. Clearence Ped on 11/05/19 Courtney Zuniga  is a 69 y.o. female, with history of right-sided recurrent non-small cell lung cancer, COPD, hypertension and GERD; who presents to the ED with a chief complaint of shortness of breath.  Patient has had frequent admissions for COPD exacerbation.  Her last admission was July 8 to July 11.  At that time she had already failed outpatient therapy with oral prednisone and doxy.  She was admitted for COPD exacerbation and started on IV steroids, nebs, antibiotics, and discharged with a prednisone taper.  Patient reports that since she finished the prednisone taper she started feeling badly again.  She reports that this was an increase in her dyspnea.  She was trying her nebulizers with no relief.  She could not sleep last night.  Her dyspnea is worse with exertion and worse with positional changes.  It is better with rest.  She has a dry cough.  She notices wheezing.  Patient denies fever and diaphoresis.  She reports that she has been taking all her medications as prescribed.  She is on 3 L oxygen supplementation at baseline.  Patient does note associated chest pain.  She reports that the chest pain is worse with breathing.  It feels crampy.  It has gone away since she has been in the hospital.  This is likely accessory muscle fatigue.  Patient admits to loss of appetite.  She reports she has not eaten in 1 to 2 days.  Patient is fully vaccinated for Covid.  Lastly patient complains of constipation.  She took a laxative yesterday and had a bowel movement.  ED highlights CT A= no pulmonary embolism ABG equals pH 7.415, PO2 53.9, PCO2 46.7 White blood cell count 10.1 CHEM panel is unremarkable  Assessment & Plan: 1-acute on chronic resp failure  with hypoxia and hypercapnia: in the setting of COPD exacerbation. -worsening work of breathing overnight, now tachypneic, using accessory muscles and with ABG demonstrating mild hypercapnia. -will continue using steroids, bronchodilator nebulizer regimen and start the use of pulmicort and Brovana. -wean off BIPAP as tolerated -continue to follow clinical response. -might required chronic use of steroids after tapering process.  2-GERD -continue PPI  3-HTN -stable and well controlled, continue current antihypertensive regimen   4-recurrent non-small cell lung cancer: -continue outpatient follow up with oncology service. -actively receiving immunotherapy treatment. -overall prognosis is guarded. -after discussing with patient she remains to be full code.   DVT prophylaxis: heparin Code Status: full code. Family Communication: no family at bedside.  Disposition: remains inpatient and in stepdown unit. Continue bronchodilators, steroids and wean off BIPAP as tolerated.   Status is: Inpatient  Dispo: The patient is from: home               Anticipated d/c is to: to be determine               Anticipated d/c date is: to be determine               Patient currently not medically ready for discharge, requiring the use of BIPAP due to mild hypercapnia, increase work of breathing, using accessory muscle sand elevated RR> unable to speak in full sentences.        Consultants:  None    Procedures:  See below for x-ray reports.    Antimicrobials:  None    Subjective: Afebrile, with increase work of breathing and unable to speak in full sentences. Using accessory muscles and with increase RR. Started on BIPAP and already appreciating good response.   Objective: Vitals:   11/06/19 0700 11/06/19 0800 11/06/19 0812 11/06/19 0900  BP: (!) 135/86 (!) 142/83  (!) 130/79  Pulse: 92 100 102 98  Resp: (!) 41 (!) 30 23 (!) 31  Temp:      TempSrc:      SpO2: 99% 99% 100% 92%    Weight:      Height:        Intake/Output Summary (Last 24 hours) at 11/06/2019 0943 Last data filed at 11/06/2019 3976 Gross per 24 hour  Intake --  Output 500 ml  Net -500 ml   Filed Weights   11/05/19 1451 11/05/19 2142  Weight: 65.3 kg 62.9 kg    Examination: General exam:increase work of breathing and resp distress; ended requiring to be placed on BIPAP. No fever. Denies CP currently. Respiratory system: poor air movement, positive exp wheezing, appreciated usage of accessory muscles prior to initiation of BIPAP. Positive rhonchi. Cardiovascular system: RRR, no JVD, no rubs, no gallops.  Gastrointestinal system: Abdomen is nondistended, soft and nontender. No organomegaly or masses felt. Normal bowel sounds heard. Central nervous system: Alert and oriented. No focal neurological deficits. Extremities: no cyanosis, no clubbing. Skin: No rashes, no petechiae.  Psychiatry: Mood & affect appropriate.     Data Reviewed: I have personally reviewed following labs and imaging studies  CBC: Recent Labs  Lab 11/05/19 1526 11/06/19 0458  WBC 10.1 7.1  NEUTROABS  --  6.2  HGB 14.1 13.1  HCT 42.5 39.4  MCV 87.8 88.1  PLT 222 734    Basic Metabolic Panel: Recent Labs  Lab 11/05/19 1526 11/06/19 0458  NA 142 142  K 4.2 4.9  CL 102 101  CO2 25 31  GLUCOSE 112* 133*  BUN 14 21  CREATININE 0.66 0.65  CALCIUM 9.6 9.4  MG  --  1.8    GFR: Estimated Creatinine Clearance: 63.6 mL/min (by C-G formula based on SCr of 0.65 mg/dL).  Liver Function Tests: Recent Labs  Lab 11/05/19 1526 11/06/19 0458  AST 20 18  ALT 17 16  ALKPHOS 181* 146*  BILITOT 0.6 0.3  PROT 7.4 6.8  ALBUMIN 3.9 3.5    CBG: No results for input(s): GLUCAP in the last 168 hours.   Recent Results (from the past 240 hour(s))  SARS Coronavirus 2 by RT PCR (hospital order, performed in [facility] hospital lab) Nasopharyngeal Nasopharyngeal Swab     Status: None   Collection Time: 11/05/19   3:08 PM   Specimen: Nasopharyngeal Swab  Result Value Ref Range Status   SARS Coronavirus 2 NEGATIVE NEGATIVE Final    Comment: (NOTE) SARS-CoV-2 target nucleic acids are NOT DETECTED.  The SARS-CoV-2 RNA is generally detectable in upper and lower respiratory specimens during the acute phase of infection. The lowest concentration of SARS-CoV-2 viral copies this assay can detect is 250 copies / mL. A negative result does not preclude SARS-CoV-2 infection and should not be used as the sole basis for treatment or other patient management decisions.  A negative result may occur with improper specimen collection / handling, submission of specimen other than nasopharyngeal swab, presence of viral mutation(s) within the areas targeted by this assay, and inadequate number  of viral copies (<250 copies / mL). A negative result must be combined with clinical observations, patient history, and epidemiological information.  Fact Sheet for Patients:   StrictlyIdeas.no  Fact Sheet for Healthcare Providers: BankingDealers.co.za  This test is not yet approved or  cleared by the [place] FDA and has been authorized for detection and/or diagnosis of SARS-CoV-2 by FDA under an Emergency Use Authorization (EUA).  This EUA will remain in effect (meaning this test can be used) for the duration of the COVID-19 declaration under Section 564(b)(1) of the Act, 21 U.S.C. section 360bbb-3(b)(1), unless the authorization is terminated or revoked sooner.  Performed at [facility], [address] [place], [place] [address]   MRSA PCR Screening     Status: None   Collection Time: 11/05/19  9:31 PM   Specimen: Nasal Mucosa; Nasopharyngeal  Result Value Ref Range Status   MRSA by PCR NEGATIVE NEGATIVE Final    Comment:        The GeneXpert MRSA Assay (FDA approved for NASAL specimens only), is one component of a comprehensive MRSA  colonization surveillance program. It is not intended to diagnose MRSA infection nor to guide or monitor treatment for MRSA infections. Performed at [facility], [address], [place], [place] [address]      Radiology Studies: CT Angio Chest PE W and/or Wo Contrast  Result Date: 11/05/2019 CLINICAL DATA:  Pulmonary embolism, dyspnea, tachycardia, lung cancer EXAM: CT ANGIOGRAPHY CHEST WITH CONTRAST TECHNIQUE: Multidetector CT imaging of the chest was performed using the standard protocol during bolus administration of intravenous contrast. Multiplanar CT image reconstructions and MIPs were obtained to evaluate the vascular anatomy. CONTRAST:  12mL OMNIPAQUE IOHEXOL 350 MG/ML SOLN COMPARISON:  10/11/2019 FINDINGS: Cardiovascular: Satisfactory opacification of the pulmonary arteries to the segmental level. No evidence of pulmonary embolism. There is central pulmonary arterial enlargement in keeping with pulmonary artery hypertension. Left ventricular hypertrophy is noted. Global cardiac size is within normal limits. Moderate coronary artery calcification noted. Mild atherosclerotic calcification is seen within the aortic arch. No aneurysm. Arch vasculature appears widely patent proximally. Mediastinum/Nodes: No pathologic thoracic adenopathy.Asymmetric soft tissue within the upper outer quadrant of the right breast is not well assessed on this examination, but appears stable since examinations dating as far back as 12/07/2017. Lungs/Pleura: Severe centrilobular emphysema with associated pulmonary hyperinflation is again identified. Chronic right middle lobe collapse is unchanged. There has developed progressive peribronchial nodularity within the anterior left upper lobe possibly infectious in the acute setting. There is progressive bronchial wall thickening, best noted within the right lower lobe suggesting acute on chronic airway inflammation. No central obstructing lesion. Upper Abdomen: Unremarkable  Musculoskeletal: No acute bone abnormality. Review of the MIP images confirms the above findings. IMPRESSION: No pulmonary embolism. Severe centrilobular emphysema. Superimposed new peribronchial nodularity and acute on chronic airway inflammation most in keeping with acute infection or inflammation. Left ventricular hypertrophy. Morphologic changes in keeping with pulmonary arterial hypertension. Aortic Atherosclerosis (ICD10-I70.0) and Emphysema (ICD10-J43.9). Electronically Signed   By: Fidela Salisbury MD   On: 11/05/2019 19:03   DG Chest Portable 1 View  Result Date: 11/05/2019 CLINICAL DATA:  Dyspnea EXAM: PORTABLE CHEST 1 VIEW COMPARISON:  10/20/2019 FINDINGS: Changes of bullous emphysema are again identified with prominent bulla formation within the right apex and hyperinflation of the lungs. Mild bibasilar atelectasis. Stable right infrahilar opacity related to middle lobe collapse and consolidation, better seen on CT examination of 10/11/2019. No focal pulmonary nodule or infiltrate. No pneumothorax or pleural  effusion. Cardiac size within normal limits. IMPRESSION: Stable pulmonary hyperinflation secondary to bullous emphysema. Unchanged right infrahilar opacity representing chronic right middle lobe collapse. No acute cardiopulmonary process identified. Emphysema (ICD10-J43.9). Electronically Signed   By: Fidela Salisbury MD   On: 11/05/2019 15:31    Scheduled Meds:  azithromycin  500 mg Oral Q2200   Chlorhexidine Gluconate Cloth  6 each Topical Daily   dextromethorphan-guaiFENesin  1 tablet Oral BID   feeding supplement (ENSURE ENLIVE)  237 mL Oral TID BM   fluticasone furoate-vilanterol  1 puff Inhalation Daily   heparin  5,000 Units Subcutaneous Q8H   ipratropium-albuterol  3 mL Nebulization Q4H   mouth rinse  15 mL Mouth Rinse BID   methylPREDNISolone (SOLU-MEDROL) injection  60 mg Intravenous Q12H   metoprolol tartrate  25 mg Oral BID   pantoprazole  40 mg Oral Daily    Continuous Infusions:   LOS: 1 day    Time spent: 35 minutes    Barton Dubois, MD [facility]   To contact the attending provider between 7A-7P or the covering provider during after hours 7P-7A, please log into the web site [email or web address] and access using universal [place] password for that web site. If you do not have the password, please call the hospital operator.  11/06/2019, 9:43 AM

## 2019-11-06 NOTE — Progress Notes (Signed)
Placed patient on Bipap at this time due to increased WOB and SOB. She is ok with this trial. I explained to her the device and the hopeful outcome of using it. After placing patient on 12/6 and 30% FIO2, she immediately began to relax her accessory muscles. She said it feels so much better. RN and MD aware.

## 2019-11-07 NOTE — Progress Notes (Signed)
Bipap placed at FIO2 of 30%. O2 sat 97%.

## 2019-11-07 NOTE — Discharge Instructions (Signed)
Eating Plan for Chronic Obstructive Pulmonary Disease Chronic obstructive pulmonary disease (COPD) causes symptoms such as shortness of breath, coughing, and chest discomfort. These symptoms can make it difficult to eat enough to maintain a healthy weight. Generally, people with COPD should eat a diet that is high in calories, protein, and other nutrients to maintain body weight and to keep the lungs as healthy as possible. Depending on the medicines you take and other health conditions you may have, your health care provider may give you additional recommendations on what to eat or avoid. Talk with your health care provider about your goals for body weight, and work with a dietitian to develop an eating plan that is right for you. What are tips for following this plan? Reading food labels   Avoid foods with more than 300 milligrams (mg) of salt (sodium) per serving.  Choose foods that contain at least 4 grams (g) of fiber per serving. Try to eat 20-30 g of fiber each day.  Choose foods that are high in calories and protein, such as nuts, beans, yogurt, and cheese. Shopping  Do not buy foods labeled as diet, low-calorie, or low-fat.  If you are able to eat dairy products: ? Avoid low-fat or skim milk. ? Buy dairy products that have at least 2% fat.  Buy nutritional supplement drinks.  Buy grains and prepared foods labeled as enriched or fortified.  Consider buying low-sodium, pre-made foods to conserve energy for eating. Cooking  Add dry milk or protein powder to smoothies.  Cook with healthy fats, such as olive oil, canola oil, sunflower oil, and grapeseed oil.  Add oil, butter, cream cheese, or nut butters to foods to increase fat and calories.  To make foods easier to chew and swallow: ? Cook vegetables, pasta, and rice until soft. ? Cut or grind meat into very small pieces. ? Dip breads in liquid. Meal planning   Eat when you feel hungry.  Eat 5-6 small meals throughout  the day.  Drink 6-8 glasses of water each day.  Do not drink liquids with meals. Drink liquids at the end of the meal to avoid feeling full too quickly.  Eat a variety of fruits and vegetables every day.  Ask for assistance from family or friends with planning and preparing meals as needed.  Avoid foods that cause you to feel bloated, such as carbonated drinks, fried foods, beans, broccoli, cabbage, and apples.  For older adults, ask your local agency on aging whether you are eligible for meal assistance programs, such as Meals on Wheels. Lifestyle   Do not smoke.  Eat slowly. Take small bites and chew food well before swallowing.  Do not overeat. This may make it more difficult to breathe after eating.  Sit up while eating.  If needed, continue to use supplemental oxygen while eating.  Rest or relax for 30 minutes before and after eating.  Monitor your weight as told by your health care provider.  Exercise as told by your health care provider. What foods can I eat? Fruits All fresh, dried, canned, or frozen fruits that do not cause gas. Vegetables All fresh, canned (no salt added), or frozen vegetables that do not cause gas. Grains Whole grain bread. Enriched whole grain pasta. Fortified whole grain cereals. Fortified rice. Quinoa. Meats and other proteins Lean meat. Poultry. Fish. Dried beans. Unsalted nuts. Tofu. Eggs. Nut butters. Dairy Whole or 2% milk. Cheese. Yogurt. Fats and oils Olive oil. Canola oil. Butter. Margarine. Beverages Water. Vegetable   juice (no salt added). Decaffeinated coffee. Decaffeinated or herbal tea. Seasonings and condiments Fresh or dried herbs. Low-salt or salt-free seasonings. Low-sodium soy sauce. The items listed above may not be a complete list of foods and beverages you can eat. Contact a dietitian for more information. What foods are not recommended? Fruits Fruits that cause gas, such as apples or melon. Vegetables Vegetables  that cause gas, such as broccoli, Brussels sprouts, cabbage, cauliflower, and onions. Canned vegetables with added salt. Meats and other proteins Fried meat. Salt-cured meat. Processed meat. Dairy Fat-free or low-fat milk, yogurt, or cheese. Processed cheese. Beverages Carbonated drinks. Caffeinated drinks, such as coffee, tea, and soft drinks. Juice. Alcohol. Vegetable juice with added salt. Seasonings and condiments Salt. Seasoning mixes with salt. Soy sauce. Pickles. Other foods Clear soup or broth. Fried foods. Prepared frozen meals. The items listed above may not be a complete list of foods and beverages you should avoid. Contact a dietitian for more information. Summary  COPD symptoms can make it difficult to eat enough to maintain a healthy weight.  A COPD eating plan can help you maintain your body weight and keep your lungs as healthy as possible.  Eat a diet that is high in calories, protein, and other nutrients. Read labels to make sure that you are getting the right nutrients. Cook foods to make them easy to chew and swallow.  Eat 5-6 small meals throughout the day, and avoid foods that cause gas or make you feel bloated. This information is not intended to replace advice given to you by your health care provider. Make sure you discuss any questions you have with your health care provider. Document Revised: 07/22/2018 Document Reviewed: 06/16/2017 Elsevier Patient Education  2020 Elsevier Inc.  

## 2019-11-07 NOTE — Progress Notes (Signed)
Initial Nutrition Assessment  DOCUMENTATION CODES:   Severe malnutrition in context of chronic illness  INTERVENTION:  Increased Ensure Enlive- po TID, each supplement provides 350 kcal and 20 grams of protein   Soft foods   NUTRITION DIAGNOSIS:   Severe Malnutrition related to cancer and cancer related treatments (right sided-non small cell lung cancer s/p radiation/chemo and is receiving immunotherapy) as evidenced by percent weight loss, energy intake < 75% for > or equal to 1 month, severe muscle depletion.  GOAL: Patient will meet greater than or equal to 90% of their needs    MONITOR: Po intake, labs, wt trends and healthcare goals     REASON FOR ASSESSMENT:   Consult- assess status    ASSESSMENT: Patient is a 69 yo female with history of GERD, COPD (home O2-3L), right sided-non small cell lung cancer s/p radiation/chemo and is receiving immunotherapy. She presents with shortness of breath.  Patient has a poor appetite and is having difficulty eating regular textures due to shortness of breath. She prefers soft foods mashed potatoes, pudding and she has been drinking Ensure TID at home. Independent with feeding.  Patient reports usual wt between 150-160 lb. On 06/15/19 -she weighed 146.7 lb which is where her wt peaked and has been trending down the past 4 months. Unplanned wt loss of (12.5 lb) 8.6% in 4 months.   NUTRITION - FOCUSED PHYSICAL EXAM: Nutrition-Focused physical exam completed. Findings are moderate fat depletion, moderate and severe muscle depletion, and no edema.    Labs reviewed.  Medications reviewed and include: solumedrol, lopressor, protonix, flexeril, albuterol and duoneb  Diet Order:   Diet Order            Diet regular Room service appropriate? Yes; Fluid consistency: Thin  Diet effective now                 EDUCATION NEEDS:  Not appropriate for education at this time Skin:  Skin Assessment: Reviewed RN Assessment  Last BM:   7/23  Height:   Ht Readings from Last 1 Encounters:  11/05/19 5\' 7"  (1.702 m)    Weight:   Wt Readings from Last 1 Encounters:  11/07/19 61 kg    Ideal Body Weight:   61 kg  BMI:  Body mass index is 21.06 kg/m.  Estimated Nutritional Needs:   Kcal:  1800-1900  Protein:  85-90 gr protein  Fluid:  >1500 ml daily  Colman Cater MS,RD,CSG,LDN Pager: Shea Evans

## 2019-11-07 NOTE — Progress Notes (Signed)
OT Cancellation Note  Patient Details Name: Courtney Zuniga MRN: [id number] DOB: [birth year]   Cancelled Treatment:    Reason Eval/Treat Not Completed: Other (comment) (Eating breakfast. Will return as schedule allows.)  [place], OTR/L Acute Rehab Pager: [phone] Office: [phone] 11/07/2019, 8:25 AM

## 2019-11-07 NOTE — Progress Notes (Signed)
PROGRESS NOTE    Courtney Zuniga  SNK:[id number] DOB: [birth year] DOA: 11/05/2019   PCP: Barry Dienes, NP   Chief Complaint  Patient presents with  . Shortness of Breath    Brief Narrative:  As per H&P written by Dr. Clearence Ped on 11/05/19 Courtney Zuniga  is a 69 y.o. female, with history of right-sided recurrent non-small cell lung cancer, COPD, hypertension and GERD; who presents to the ED with a chief complaint of shortness of breath.  Patient has had frequent admissions for COPD exacerbation.  Her last admission was July 8 to July 11.  At that time she had already failed outpatient therapy with oral prednisone and doxy.  She was admitted for COPD exacerbation and started on IV steroids, nebs, antibiotics, and discharged with a prednisone taper.  Patient reports that since she finished the prednisone taper she started feeling badly again.  She reports that this was an increase in her dyspnea.  She was trying her nebulizers with no relief.  She could not sleep last night.  Her dyspnea is worse with exertion and worse with positional changes.  It is better with rest.  She has a dry cough.  She notices wheezing.  Patient denies fever and diaphoresis.  She reports that she has been taking all her medications as prescribed.  She is on 3 L oxygen supplementation at baseline.  Patient does note associated chest pain.  She reports that the chest pain is worse with breathing.  It feels crampy.  It has gone away since she has been in the hospital.  This is likely accessory muscle fatigue.  Patient admits to loss of appetite.  She reports she has not eaten in 1 to 2 days.  Patient is fully vaccinated for Covid.  Lastly patient complains of constipation.  She took a laxative yesterday and had a bowel movement.  ED highlights CT A= no pulmonary embolism ABG equals pH 7.415, PO2 53.9, PCO2 46.7 White blood cell count 10.1 CHEM panel is unremarkable  Assessment & Plan: 1-acute on chronic resp failure  with hypoxia and hypercapnia: in the setting of COPD exacerbation. -worsening work of breathing overnight, now tachypneic, using accessory muscles and with ABG demonstrating mild hypercapnia. -will continue using steroids, bronchodilator nebulizer regimen and start the use of pulmicort and Brovana. -wean off BIPAP as tolerated -continue to follow clinical response. -might required chronic use of steroids after tapering process.  2-GERD -continue PPI  3-HTN -stable and well controlled, continue current antihypertensive regimen   4-recurrent non-small cell lung cancer: -continue outpatient follow up with oncology service. -actively receiving immunotherapy treatment. -overall prognosis is guarded. -after discussing with patient she remains to be full code.   DVT prophylaxis: heparin Code Status: full code. Family Communication: no family at bedside.  Disposition: remains inpatient and in stepdown unit. Continue bronchodilators, steroids and wean off BIPAP as tolerated.   Status is: Inpatient  Dispo: The patient is from: home               Anticipated d/c is to: to be determine               Anticipated d/c date is: to be determine               Patient currently not medically ready for discharge, requiring the use of BIPAP due to increase work of breathing and elevated RR. Mild use of accessory muscles appreciated. unable to speak in full sentences and minimal activities limited due to SOB.Marland Kitchen  Consultants:   None    Procedures:  See below for x-ray reports.    Antimicrobials:  None    Subjective: No fever. SOB with minimal activity, intermittently requiring BIPAP and with ongoing mild use of accessory muscles and tachypnea.  Objective: Vitals:   11/07/19 1618 11/07/19 1700 11/07/19 1800 11/07/19 1854  BP:   (!) 146/80   Pulse:  69 99 98  Resp:  (!) 34 (!) 38 (!) 24  Temp:      TempSrc:      SpO2: 96% 95% 91% 94%  Weight:      Height:        Intake/Output  Summary (Last 24 hours) at 11/07/2019 1930 Last data filed at 11/06/2019 2000 Gross per 24 hour  Intake 240 ml  Output --  Net 240 ml   Filed Weights   11/05/19 1451 11/05/19 2142 11/07/19 0600  Weight: 65.3 kg 62.9 kg 61 kg    Examination: General exam: Alert, awake, oriented x 3, feeling slightly better, but still SOB with minimal exertion and using BIPAP intermittently. Positive tachypnea appreciated on exam. Respiratory system: poor air movement, positive exp wheezing and bilateral rhonchi. Positive tachypnea and mild use of accessory muscles appreciated. Cardiovascular system:sinus tachycardia, no rubs, no gallops, no JVD. Gastrointestinal system: Abdomen is nondistended, soft and nontender. No organomegaly or masses felt. Normal bowel sounds heard. Central nervous system: Alert and oriented. No focal neurological deficits. Extremities: No Cyanosis or clubbing. Skin: No rashes, no petechiae. Psychiatry:  Mood & affect appropriate.     Data Reviewed: I have personally reviewed following labs and imaging studies  CBC: Recent Labs  Lab 11/05/19 1526 11/06/19 0458  WBC 10.1 7.1  NEUTROABS  --  6.2  HGB 14.1 13.1  HCT 42.5 39.4  MCV 87.8 88.1  PLT 222 867    Basic Metabolic Panel: Recent Labs  Lab 11/05/19 1526 11/06/19 0458  NA 142 142  K 4.2 4.9  CL 102 101  CO2 25 31  GLUCOSE 112* 133*  BUN 14 21  CREATININE 0.66 0.65  CALCIUM 9.6 9.4  MG  --  1.8    GFR: Estimated Creatinine Clearance: 63 mL/min (by C-G formula based on SCr of 0.65 mg/dL).  Liver Function Tests: Recent Labs  Lab 11/05/19 1526 11/06/19 0458  AST 20 18  ALT 17 16  ALKPHOS 181* 146*  BILITOT 0.6 0.3  PROT 7.4 6.8  ALBUMIN 3.9 3.5    CBG: No results for input(s): GLUCAP in the last 168 hours.   Recent Results (from the past 240 hour(s))  SARS Coronavirus 2 by RT PCR (hospital order, performed in [facility] hospital lab) Nasopharyngeal Nasopharyngeal Swab     Status: None    Collection Time: 11/05/19  3:08 PM   Specimen: Nasopharyngeal Swab  Result Value Ref Range Status   SARS Coronavirus 2 NEGATIVE NEGATIVE Final    Comment: (NOTE) SARS-CoV-2 target nucleic acids are NOT DETECTED.  The SARS-CoV-2 RNA is generally detectable in upper and lower respiratory specimens during the acute phase of infection. The lowest concentration of SARS-CoV-2 viral copies this assay can detect is 250 copies / mL. A negative result does not preclude SARS-CoV-2 infection and should not be used as the sole basis for treatment or other patient management decisions.  A negative result may occur with improper specimen collection / handling, submission of specimen other than nasopharyngeal swab, presence of viral mutation(s) within the areas targeted by this assay, and inadequate number of viral  copies (<250 copies / mL). A negative result must be combined with clinical observations, patient history, and epidemiological information.  Fact Sheet for Patients:   StrictlyIdeas.no  Fact Sheet for Healthcare Providers: BankingDealers.co.za  This test is not yet approved or  cleared by the [place] FDA and has been authorized for detection and/or diagnosis of SARS-CoV-2 by FDA under an Emergency Use Authorization (EUA).  This EUA will remain in effect (meaning this test can be used) for the duration of the COVID-19 declaration under Section 564(b)(1) of the Act, 21 U.S.C. section 360bbb-3(b)(1), unless the authorization is terminated or revoked sooner.  Performed at [facility], [address], [place], [place] [address]   MRSA PCR Screening     Status: None   Collection Time: 11/05/19  9:31 PM   Specimen: Nasal Mucosa; Nasopharyngeal  Result Value Ref Range Status   MRSA by PCR NEGATIVE NEGATIVE Final    Comment:        The GeneXpert MRSA Assay (FDA approved for NASAL specimens only), is one component of a comprehensive  MRSA colonization surveillance program. It is not intended to diagnose MRSA infection nor to guide or monitor treatment for MRSA infections. Performed at [facility], [address] [place], [place] [address]      Radiology Studies: No results found.  Scheduled Meds: . arformoterol  15 mcg Nebulization BID  . azithromycin  500 mg Oral Q2200  . budesonide (PULMICORT) nebulizer solution  0.5 mg Nebulization BID  . Chlorhexidine Gluconate Cloth  6 each Topical Daily  . dextromethorphan-guaiFENesin  1 tablet Oral BID  . feeding supplement (ENSURE ENLIVE)  237 mL Oral TID BM  . heparin  5,000 Units Subcutaneous Q8H  . ipratropium-albuterol  3 mL Nebulization QID  . mouth rinse  15 mL Mouth Rinse BID  . methylPREDNISolone (SOLU-MEDROL) injection  60 mg Intravenous Q8H  . metoprolol tartrate  25 mg Oral BID  . pantoprazole  40 mg Oral Daily   Continuous Infusions:   LOS: 2 days    Time spent: 35 minutes    Barton Dubois, MD [facility]   To contact the attending provider between 7A-7P or the covering provider during after hours 7P-7A, please log into the web site [email or web address] and access using universal  password for that web site. If you do not have the password, please call the hospital operator.  11/07/2019, 7:30 PM

## 2019-11-08 ENCOUNTER — Telehealth: Payer: Self-pay | Admitting: *Deleted

## 2019-11-08 NOTE — Telephone Encounter (Signed)
Patient called requesting call back.  Upon attempt to call back, voicemail not available since it was full.  Unsure of need or request as it was not left on vm.

## 2019-11-08 NOTE — Progress Notes (Signed)
PROGRESS NOTE    Courtney Zuniga  OYD:[id number] DOB: 28-Jun-1950 DOA: 11/05/2019   PCP: Barry Dienes, NP   Chief Complaint  Patient presents with  . Shortness of Breath    Brief Narrative:  As per H&P written by Dr. Clearence Ped on 11/05/19 Courtney Zuniga  is a 69 y.o. female, with history of right-sided recurrent non-small cell lung cancer, COPD, hypertension and GERD; who presents to the ED with a chief complaint of shortness of breath.  Patient has had frequent admissions for COPD exacerbation.  Her last admission was July 8 to July 11.  At that time she had already failed outpatient therapy with oral prednisone and doxy.  She was admitted for COPD exacerbation and started on IV steroids, nebs, antibiotics, and discharged with a prednisone taper.  Patient reports that since she finished the prednisone taper she started feeling badly again.  She reports that this was an increase in her dyspnea.  She was trying her nebulizers with no relief.  She could not sleep last night.  Her dyspnea is worse with exertion and worse with positional changes.  It is better with rest.  She has a dry cough.  She notices wheezing.  Patient denies fever and diaphoresis.  She reports that she has been taking all her medications as prescribed.  She is on 3 L oxygen supplementation at baseline.  Patient does note associated chest pain.  She reports that the chest pain is worse with breathing.  It feels crampy.  It has gone away since she has been in the hospital.  This is likely accessory muscle fatigue.  Patient admits to loss of appetite.  She reports she has not eaten in 1 to 2 days.  Patient is fully vaccinated for Covid.  Lastly patient complains of constipation.  She took a laxative yesterday and had a bowel movement.  ED highlights CT A= no pulmonary embolism ABG equals pH 7.415, PO2 53.9, PCO2 46.7 White blood cell count 10.1 CHEM panel is unremarkable  Assessment & Plan: 1-acute on chronic resp failure  with hypoxia and hypercapnia: in the setting of COPD exacerbation. -worsening work of breathing overnight, now tachypneic, using accessory muscles and with ABG demonstrating mild hypercapnia. -will continue using steroids, bronchodilator nebulizer regimen and start the use of pulmicort and Brovana. -continue to wean off BIPAP as tolerated -continue to follow clinical response. -might required chronic use of steroids after tapering process.  2-GERD -continue PPI  3-HTN -stable and well controlled, continue current antihypertensive regimen   4-recurrent non-small cell lung cancer: -continue outpatient follow up with oncology service. -actively receiving immunotherapy treatment. -overall prognosis is guarded. -after discussing with patient she remains to be full code. -palliative care consulted.   DVT prophylaxis: heparin Code Status: full code. Family Communication: no family at bedside.  Disposition: remains inpatient and in stepdown unit. Continue bronchodilators, steroids and wean off BIPAP as tolerated.   Status is: Inpatient  Dispo: The patient is from: home               Anticipated d/c is to: to be determine               Anticipated d/c date is: to be determine               Patient currently not medically ready for discharge, requiring the use of BIPAP due to increase work of breathing and elevated RR. Mild use of accessory muscles appreciated. unable to speak in full sentences and minimal activities  limited due to SOB..    Consultants:   None    Procedures:  See below for x-ray reports.    Antimicrobials:  None    Subjective: Afebrile, no CP, no nausea, no vomiting. SOB with exertion and with difficulty speaking in full sentences.  Objective: Vitals:   11/08/19 1600 11/08/19 1642 11/08/19 1700 11/08/19 1815  BP: (!) 145/85  (!) 142/89 (!) 141/80  Pulse: 95 97 93 (!) 114  Resp: (!) 31 (!) 29 (!) 30 19  Temp:  98.7 F (37.1 C)    TempSrc:  Oral    SpO2:  95% 98% 96% 100%  Weight:      Height:       No intake or output data in the 24 hours ending 11/08/19 2016 Filed Weights   11/05/19 1451 11/05/19 2142 11/07/19 0600  Weight: 65.3 kg 62.9 kg 61 kg    Examination: General exam: Alert, awake, oriented x 3, still SOB and with mild difficulty speaking in full senteces, no CP, no nausea or vomiting. Having a good day and has not required BIPAP so far. Overnight needed BIPAP and activities still increase work of breathing and resp distress. Respiratory system: positive rhonchi and exp wheezing, mild use of accessory muscles and tachypnea with exertion. Cardiovascular system:Rate controlled currently, no rubs, no gallops, no murmur, no JVD. Gastrointestinal system: Abdomen is nondistended, soft and nontender. No organomegaly or masses felt. Normal bowel sounds heard. Central nervous system: Alert and oriented. No focal neurological deficits. Extremities: No Cyanosis, no clubbing. Skin: No rashes, lesions or ulcers Psychiatry: Mood & affect appropriate.     Data Reviewed: I have personally reviewed following labs and imaging studies  CBC: Recent Labs  Lab 11/05/19 1526 11/06/19 0458  WBC 10.1 7.1  NEUTROABS  --  6.2  HGB 14.1 13.1  HCT 42.5 39.4  MCV 87.8 88.1  PLT 222 174    Basic Metabolic Panel: Recent Labs  Lab 11/05/19 1526 11/06/19 0458  NA 142 142  K 4.2 4.9  CL 102 101  CO2 25 31  GLUCOSE 112* 133*  BUN 14 21  CREATININE 0.66 0.65  CALCIUM 9.6 9.4  MG  --  1.8    GFR: Estimated Creatinine Clearance: 63 mL/min (by C-G formula based on SCr of 0.65 mg/dL).  Liver Function Tests: Recent Labs  Lab 11/05/19 1526 11/06/19 0458  AST 20 18  ALT 17 16  ALKPHOS 181* 146*  BILITOT 0.6 0.3  PROT 7.4 6.8  ALBUMIN 3.9 3.5    CBG: No results for input(s): GLUCAP in the last 168 hours.   Recent Results (from the past 240 hour(s))  SARS Coronavirus 2 by RT PCR (hospital order, performed in [facility] hospital  lab) Nasopharyngeal Nasopharyngeal Swab     Status: None   Collection Time: 11/05/19  3:08 PM   Specimen: Nasopharyngeal Swab  Result Value Ref Range Status   SARS Coronavirus 2 NEGATIVE NEGATIVE Final    Comment: (NOTE) SARS-CoV-2 target nucleic acids are NOT DETECTED.  The SARS-CoV-2 RNA is generally detectable in upper and lower respiratory specimens during the acute phase of infection. The lowest concentration of SARS-CoV-2 viral copies this assay can detect is 250 copies / mL. A negative result does not preclude SARS-CoV-2 infection and should not be used as the sole basis for treatment or other patient management decisions.  A negative result may occur with improper specimen collection / handling, submission of specimen other than nasopharyngeal swab, presence of viral mutation(s)  within the areas targeted by this assay, and inadequate number of viral copies (<250 copies / mL). A negative result must be combined with clinical observations, patient history, and epidemiological information.  Fact Sheet for Patients:   StrictlyIdeas.no  Fact Sheet for Healthcare Providers: BankingDealers.co.za  This test is not yet approved or  cleared by the [place] FDA and has been authorized for detection and/or diagnosis of SARS-CoV-2 by FDA under an Emergency Use Authorization (EUA).  This EUA will remain in effect (meaning this test can be used) for the duration of the COVID-19 declaration under Section 564(b)(1) of the Act, 21 U.S.C. section 360bbb-3(b)(1), unless the authorization is terminated or revoked sooner.  Performed at [facility], [address], [place], [place] [address]   MRSA PCR Screening     Status: None   Collection Time: 11/05/19  9:31 PM   Specimen: Nasal Mucosa; Nasopharyngeal  Result Value Ref Range Status   MRSA by PCR NEGATIVE NEGATIVE Final    Comment:        The GeneXpert MRSA Assay (FDA approved for  NASAL specimens only), is one component of a comprehensive MRSA colonization surveillance program. It is not intended to diagnose MRSA infection nor to guide or monitor treatment for MRSA infections. Performed at [facility], [address], [place], [place] [address]      Radiology Studies: No results found.  Scheduled Meds: . arformoterol  15 mcg Nebulization BID  . azithromycin  500 mg Oral Q2200  . budesonide (PULMICORT) nebulizer solution  0.5 mg Nebulization BID  . Chlorhexidine Gluconate Cloth  6 each Topical Daily  . dextromethorphan-guaiFENesin  1 tablet Oral BID  . feeding supplement (ENSURE ENLIVE)  237 mL Oral TID BM  . heparin  5,000 Units Subcutaneous Q8H  . ipratropium-albuterol  3 mL Nebulization QID  . mouth rinse  15 mL Mouth Rinse BID  . methylPREDNISolone (SOLU-MEDROL) injection  60 mg Intravenous Q8H  . metoprolol tartrate  25 mg Oral BID  . pantoprazole  40 mg Oral Daily   Continuous Infusions:   LOS: 3 days    Time spent: 35 minutes    Barton Dubois, MD [facility]   To contact the attending provider between 7A-7P or the covering provider during after hours 7P-7A, please log into the web site [email or web address] and access using universal [place] password for that web site. If you do not have the password, please call the hospital operator.  11/08/2019, 8:16 PM

## 2019-11-08 NOTE — Evaluation (Addendum)
Physical Therapy Evaluation Patient Details Name: Courtney Zuniga MRN: [id number] DOB: [birth year] Today's Date: 11/08/2019   History of Present Illness  Courtney Zuniga  is a 69 y.o. female, with history of right-sided recurrent non-small cell lung cancer, COPD, hypertension, GERD, and more presents to the ED with a chief complaint of shortness of breath.  Patient has had frequent admissions for COPD exacerbation.  Her last admission was July 2 to July 8.  At that time she had already failed outpatient therapy with oral prednisone and doxy.  She was admitted for COPD exacerbation and started on IV steroids, nebs, antibiotics, and discharged with a prednisone taper.  Patient reports that since she finished the prednisone taper she started feeling badly again.  She started to acutely feel worse yesterday.  She reports that this was an increase in her dyspnea.  She was trying her nebulizers with no relief.  She could not sleep last night.  Her dyspnea is worse with exertion and worse with positional changes.  It is better with rest.  She has a dry cough.  She notices wheezing.  Patient denies fever and diaphoresis.  She reports that she has been taking all her medications as prescribed.  She is on 3 L oxygen supplementation at baseline.  Patient does note associated chest pain.  She reports that the chest pain is worse with breathing.  It feels crampy.  It has gone away since she has been in the hospital.  This is likely accessory muscle fatigue.  Patient admits to loss of appetite.  She reports she has not eaten in 1 to 2 days.  Patient is fully vaccinated for Covid.  Lastly patient complains of constipation.  She took a laxative yesterday and had a bowel movement.    Clinical Impression  The patient was able to move through all transfers and ambulation at baseline function, which was 2 LPM. The patient was able to ambulate 100 feet without assistance from PT using 2 LPM O2. Patient tolerated sitting up in  chair with call bell nearby after therapy. The patient will be discharged from physical therapy to nursing for ambulation as tolerated for length of stay from venue named below.      Follow Up Recommendations No PT follow up    Equipment Recommendations  None recommended by PT;Other (comment) (keep using the O2 at home)    Recommendations for Other Services       Precautions / Restrictions Precautions Precautions: Other (comment) Precaution Comments: 2 L O2 Restrictions Weight Bearing Restrictions: No      Mobility  Bed Mobility Overal bed mobility: Independent                Transfers Overall transfer level: Independent Equipment used: None                Ambulation/Gait Ambulation/Gait assistance: Independent Gait Distance (Feet): 100 Feet Assistive device: None Gait Pattern/deviations: WFL(Within Functional Limits);Step-through pattern   Gait velocity interpretation: >4.37 ft/sec, indicative of normal walking speed    Stairs            Wheelchair Mobility    Modified Rankin (Stroke Patients Only)       Balance Overall balance assessment: Independent                                           Pertinent Vitals/Pain Pain Assessment: No/denies pain  Home Living Family/patient expects to be discharged to:: Private residence Living Arrangements: Alone   Type of Home: Mobile home Home Access: Stairs to enter Entrance Stairs-Rails: Can reach both;Left;Right Entrance Stairs-Number of Steps: 4 Home Layout: One level;Full bath on main level Home Equipment: Walker - 2 wheels;Cane - single point (however does not use AD's) Additional Comments: on 3 LPM home O2    Prior Function Level of Independence: Independent         Comments: community ambulator, drives short distances, independent in ADL's     Hand Dominance   Dominant Hand: Right    Extremity/Trunk Assessment   Upper Extremity Assessment Upper Extremity  Assessment: Overall WFL for tasks assessed    Lower Extremity Assessment Lower Extremity Assessment: Overall WFL for tasks assessed    Cervical / Trunk Assessment Cervical / Trunk Assessment: Normal  Communication   Communication: No difficulties  Cognition Arousal/Alertness: Awake/alert Behavior During Therapy: WFL for tasks assessed/performed Overall Cognitive Status: Within Functional Limits for tasks assessed                                        General Comments      Exercises     Assessment/Plan    PT Assessment Patent does not need any further PT services  PT Problem List         PT Treatment Interventions      PT Goals (Current goals can be found in the Care Plan section)  Acute Rehab PT Goals Patient Stated Goal: go home PT Goal Formulation: With patient Time For Goal Achievement: 11/09/19 Potential to Achieve Goals: Good    Frequency     Barriers to discharge        Co-evaluation               AM-PAC PT "6 Clicks" Mobility  Outcome Measure Help needed turning from your back to your side while in a flat bed without using bedrails?: None Help needed moving from lying on your back to sitting on the side of a flat bed without using bedrails?: None Help needed moving to and from a bed to a chair (including a wheelchair)?: None Help needed standing up from a chair using your arms (e.g., wheelchair or bedside chair)?: None Help needed to walk in hospital room?: A Little (2 LPM O2) Help needed climbing 3-5 steps with a railing? : A Little 6 Click Score: 22    End of Session Equipment Utilized During Treatment: Oxygen (2 LPM) Activity Tolerance: Patient tolerated treatment well Patient left: in chair;with call bell/phone within reach   PT Visit Diagnosis: Other symptoms and signs involving the nervous system (R29.898);Other abnormalities of gait and mobility (R26.89);Muscle weakness (generalized) (M62.81)    Time: 8101-7510 PT  Time Calculation (min) (ACUTE ONLY): 19 min   Charges:   PT Evaluation $PT Eval Low Complexity: 1 Low PT Treatments $Therapeutic Activity: 8-22 mins        3:29 PM , 11/08/19 Karlyn Agee, SPT Physical Therapy with [facility] [phone] office  During this treatment session, the therapist was present, participating in and directing the treatment.  3:29 PM, 11/08/19 Lonell Grandchild, MPT Physical Therapist with [facility] [phone] office [phone] mobile phone

## 2019-11-08 NOTE — Progress Notes (Signed)
OT Cancellation Note  Patient Details Name: Courtney Zuniga MRN: [id number] DOB: [birth year]   Cancelled Treatment:    Reason Eval/Treat Not Completed: Patient at procedure or test/ unavailable. Pt seated at EOB eating breakfast, would like to finish. Will check back as schedule allows.    Guadelupe Sabin, OTR/L  [phone] 11/08/2019, 8:36 AM

## 2019-11-08 NOTE — Progress Notes (Signed)
Placed Bipap at FIO2 of 30%. Oxygen sat currently 98% and no s/s of respiratory distress, SOB, or increased effort.

## 2019-11-09 ENCOUNTER — Telehealth: Payer: Self-pay | Admitting: Internal Medicine

## 2019-11-09 MED ORDER — PREDNISONE 50 MG PO TABS
50.0000 mg | ORAL_TABLET | Freq: Every day | ORAL | 0 refills | Status: AC
Start: 2019-11-09 — End: 2019-11-12

## 2019-11-09 NOTE — Progress Notes (Signed)
Nsg Discharge Note  Admit Date:  11/05/2019 Discharge date: 11/09/2019   Courtney Zuniga to be D/C'd Home per MD order.  AVS completed.  Copy for chart, and copy for patient signed, and dated. Patient/caregiver able to verbalize understanding.  Discharge Medication: Allergies as of 11/09/2019   No Known Allergies     Medication List    TAKE these medications   acetaminophen 325 MG tablet Commonly known as: TYLENOL Take 2 tablets (650 mg total) by mouth every 6 (six) hours as needed for mild pain, fever or headache.   albuterol 108 (90 Base) MCG/ACT inhaler Commonly known as: VENTOLIN HFA Inhale 2 puffs into the lungs every 4 (four) hours as needed for shortness of breath.   azithromycin 250 MG tablet Commonly known as: ZITHROMAX Take 1 tablet (250 mg total) by mouth daily.   Breo Ellipta 100-25 MCG/INH Aepb Generic drug: fluticasone furoate-vilanterol Inhale 1 puff into the lungs daily.   cyclobenzaprine 10 MG tablet Commonly known as: FLEXERIL Take 1 tablet (10 mg total) by mouth 2 (two) times daily. *MAy take one additional tablet as needed for muscle spasms   dextromethorphan-guaiFENesin 30-600 MG 12hr tablet Commonly known as: MUCINEX DM Take 1 tablet by mouth 2 (two) times daily.   diclofenac sodium 1 % Gel Commonly known as: VOLTAREN Apply topically 4 (four) times daily as needed (massge gel into affected area(s) as needed for pain).   Ensure Take 237 mLs by mouth 3 (three) times daily between meals.   ipratropium-albuterol 0.5-2.5 (3) MG/3ML Soln Commonly known as: DUONEB Take 3 mLs by nebulization 3 (three) times daily.   methocarbamol 500 MG tablet Commonly known as: ROBAXIN Take 1 tablet (500 mg total) by mouth every 8 (eight) hours as needed for muscle spasms.   metoprolol tartrate 25 MG tablet Commonly known as: LOPRESSOR Take 1 tablet (25 mg total) by mouth 2 (two) times daily.   nicotine 14 mg/24hr patch Commonly known as: NICODERM CQ - dosed in  mg/24 hours Place 1 patch (14 mg total) onto the skin daily.   oxyCODONE-acetaminophen 5-325 MG tablet Commonly known as: PERCOCET/ROXICET Take 1 tablet by mouth every 8 (eight) hours as needed for moderate pain.   pantoprazole 40 MG tablet Commonly known as: PROTONIX Take 1 tablet (40 mg total) by mouth daily.   predniSONE 50 MG tablet Commonly known as: DELTASONE Take 1 tablet (50 mg total) by mouth daily with breakfast for 3 days. What changed:   medication strength  how much to take  when to take this  additional instructions   Robitussin 12 Hour Cough 30 MG/5ML liquid Generic drug: dextromethorphan Take 30 mg by mouth every 4 (four) hours as needed for cough.   topiramate 25 MG tablet Commonly known as: Topamax Take 1 tablet by mouth daily at bedtime X 1 week; then increase to 25mg  BID.   Vitamin D3 125 MCG (5000 UT) Caps Take 1 capsule by mouth daily.       Discharge Assessment: Vitals:   11/09/19 1100 11/09/19 1200  BP: (!) 131/94 (!) 138/78  Pulse: 82 90  Resp: (!) 27 (!) 28  Temp:    SpO2: 96% 94%   Skin clean, dry and intact without evidence of skin break down, no evidence of skin tears noted. IV catheter discontinued intact. Site without signs and symptoms of complications - no redness or edema noted at insertion site, patient denies c/o pain - only slight tenderness at site.  Dressing with slight pressure applied.  D/c Instructions-Education: Discharge instructions given to patient/family with verbalized understanding. D/c education completed with patient/family including follow up instructions, medication list, d/c activities limitations if indicated, with other d/c instructions as indicated by MD - patient able to verbalize understanding, all questions fully answered. Patient instructed to return to ED, call 911, or call MD for any changes in condition.  Patient escorted via [place], and D/C home via private auto.  Carney Corners, RN 11/09/2019 12:11  PM

## 2019-11-09 NOTE — Care Management Important Message (Signed)
Important Message  Patient Details  Name: Courtney Zuniga MRN: [id number] Date of Birth: [birth year]   Medicare Important Message Given:  Yes     Tommy Medal 11/09/2019, 11:26 AM

## 2019-11-09 NOTE — Telephone Encounter (Signed)
R/s appt per 7/27 sch msg - pt is aware of appt date and time

## 2019-11-09 NOTE — Discharge Summary (Signed)
Physician Discharge Summary  Courtney Zuniga DDU:[id number] DOB: [birth year] DOA: 11/05/2019  PCP: Barry Dienes, NP  Admit date: 11/05/2019 Discharge date: 11/09/2019  Admitted From: Home Disposition: Home  Recommendations for Outpatient Follow-up:  1. Follow up with PCP in 1-2 weeks 2. Please obtain BMP/CBC in one week 3. Please follow up with your PCP on the following pending results: Unresulted Labs (From admission, onward) Comment          Start     Ordered   11/06/19 0500  Culture, sputum-assessment  Tomorrow morning,   R        11/05/19 2129           Home Health: None Equipment/Devices: None  Discharge Condition: Stable CODE STATUS: Full code Diet recommendation: Cardiac  Subjective: Seen and examined.  Courtney Zuniga has no complaints.  Denied any shortness of breath and states that Courtney Zuniga is back to her baseline of 2 L of nasal oxygen and symptoms and expressed her wishes to go home.  As per H&P written by Dr. Clearence Ped on 11/05/19 Courtney Zuniga a30 y.o.female,with history of right-sided recurrent non-small cell lung cancer,COPD, hypertension and GERD; who presents to the ED with a chief complaint of shortness of breath. Patient has had frequent admissions for COPD exacerbation. Her last admission was July 8 to July 11. At that time Courtney Zuniga had already failed outpatient therapy with oral prednisone and doxy. Courtney Zuniga was admitted for COPD exacerbation and started on IV steroids, nebs, antibiotics, and discharged with a prednisone taper. Patient reports that since Courtney Zuniga finished the prednisone taper Courtney Zuniga started feeling badly again. Courtney Zuniga reports that this was an increase in her dyspnea. Courtney Zuniga was trying her nebulizers with no relief. Courtney Zuniga could not sleep last night. Her dyspnea is worse with exertion and worse with positional changes. It is better with rest. Courtney Zuniga has a dry cough. Courtney Zuniga notices wheezing. Patient denies fever and diaphoresis. Courtney Zuniga reports that Courtney Zuniga has been taking all her  medications as prescribed. Courtney Zuniga is on 3 L oxygen supplementation at baseline. Patient does note associated chest pain. Courtney Zuniga reports that the chest pain is worse with breathing. It feels crampy. It has gone away since Courtney Zuniga has been in the hospital. This is likely accessory muscle fatigue. Patient admits to loss of appetite. Courtney Zuniga reports Courtney Zuniga has not eaten in 1 to 2 days. Patient is fully vaccinated for Covid. Lastly patient complains of constipation. Courtney Zuniga took a laxative yesterday and had a bowel movement.  ED highlights CTA=no pulmonary embolism ABG equals pH 7.415, PO2 53.9, PCO2 46.7 White blood cell count 10.1 CHEM panel is unremarkable  Brief/Interim Summary: Patient was admitted under hospital service due to acute on chronic respiratory failure with hypoxia and hypercapnia secondary to acute COPD exacerbation.  Courtney Zuniga was started on bronchodilators, nebulizers and IV Solu-Medrol.  Courtney Zuniga also needed BiPAP in the beginning but then Courtney Zuniga was using that intermittently.  When seen today for the first time, patient denied having any shortness of breath.  Courtney Zuniga was on 2 L of nasal oxygen.  Courtney Zuniga told me that Courtney Zuniga is back to her baseline and wanted to go home.  Courtney Zuniga was given the option to be transferred to medical floor for overnight observation however Courtney Zuniga decided to go home instead.  Courtney Zuniga is being discharged in stable condition.  Discharge Diagnoses:  Active Problems:   Adenocarcinoma of right lung, stage 3 (HCC)   Acute on chronic respiratory failure with hypoxia and hypercapnia (HCC)   COPD exacerbation ([place])    Discharge  Instructions  Discharge Instructions    Discharge patient   Complete by: As directed    Discharge disposition: 01-Home or Self Care   Discharge patient date: 11/09/2019     Allergies as of 11/09/2019   No Known Allergies     Medication List    TAKE these medications   acetaminophen 325 MG tablet Commonly known as: TYLENOL Take 2 tablets (650 mg total) by mouth every 6  (six) hours as needed for mild pain, fever or headache.   albuterol 108 (90 Base) MCG/ACT inhaler Commonly known as: VENTOLIN HFA Inhale 2 puffs into the lungs every 4 (four) hours as needed for shortness of breath.   azithromycin 250 MG tablet Commonly known as: ZITHROMAX Take 1 tablet (250 mg total) by mouth daily.   Breo Ellipta 100-25 MCG/INH Aepb Generic drug: fluticasone furoate-vilanterol Inhale 1 puff into the lungs daily.   cyclobenzaprine 10 MG tablet Commonly known as: FLEXERIL Take 1 tablet (10 mg total) by mouth 2 (two) times daily. *MAy take one additional tablet as needed for muscle spasms   dextromethorphan-guaiFENesin 30-600 MG 12hr tablet Commonly known as: MUCINEX DM Take 1 tablet by mouth 2 (two) times daily.   diclofenac sodium 1 % Gel Commonly known as: VOLTAREN Apply topically 4 (four) times daily as needed (massge gel into affected area(s) as needed for pain).   Ensure Take 237 mLs by mouth 3 (three) times daily between meals.   ipratropium-albuterol 0.5-2.5 (3) MG/3ML Soln Commonly known as: DUONEB Take 3 mLs by nebulization 3 (three) times daily.   methocarbamol 500 MG tablet Commonly known as: ROBAXIN Take 1 tablet (500 mg total) by mouth every 8 (eight) hours as needed for muscle spasms.   metoprolol tartrate 25 MG tablet Commonly known as: LOPRESSOR Take 1 tablet (25 mg total) by mouth 2 (two) times daily.   nicotine 14 mg/24hr patch Commonly known as: NICODERM CQ - dosed in mg/24 hours Place 1 patch (14 mg total) onto the skin daily.   oxyCODONE-acetaminophen 5-325 MG tablet Commonly known as: PERCOCET/ROXICET Take 1 tablet by mouth every 8 (eight) hours as needed for moderate pain.   pantoprazole 40 MG tablet Commonly known as: PROTONIX Take 1 tablet (40 mg total) by mouth daily.   predniSONE 50 MG tablet Commonly known as: DELTASONE Take 1 tablet (50 mg total) by mouth daily with breakfast for 3 days. What changed:   medication  strength  how much to take  when to take this  additional instructions   Robitussin 12 Hour Cough 30 MG/5ML liquid Generic drug: dextromethorphan Take 30 mg by mouth every 4 (four) hours as needed for cough.   topiramate 25 MG tablet Commonly known as: Topamax Take 1 tablet by mouth daily at bedtime X 1 week; then increase to 25mg  BID.   Vitamin D3 125 MCG (5000 UT) Caps Take 1 capsule by mouth daily.       Follow-up Information    Barry Dienes, NP Follow up in 1 week(s).   Specialty: Nurse Practitioner Contact information: [place] [address] [phone]              No Known Allergies  Consultations: None   Procedures/Studies: CT Chest W Contrast  Result Date: 10/11/2019 CLINICAL DATA:  Non-small cell lung cancer restaging EXAM: CT CHEST, ABDOMEN, AND PELVIS WITH CONTRAST TECHNIQUE: Multidetector CT imaging of the chest, abdomen and pelvis was performed following the standard protocol during bolus administration of intravenous contrast. CONTRAST:  170mL  OMNIPAQUE IOHEXOL 300 MG/ML SOLN, additional oral enteric contrast COMPARISON:  07/05/2019, 04/03/2019 FINDINGS: CT CHEST FINDINGS Cardiovascular: Aortic atherosclerosis. Normal heart size. Three-vessel coronary artery calcifications. No pericardial effusion. Mediastinum/Nodes: No enlarged mediastinal, hilar, or axillary lymph nodes. Thyroid gland, trachea, and esophagus demonstrate no significant findings. Lungs/Pleura: Severe centrilobular emphysema. Interval increase in consolidation and volume loss of the right middle lobe, now with almost complete fibrotic consolidation (series 6, image 106). Bandlike scarring of the bilateral lung bases. No pleural effusion or pneumothorax. Musculoskeletal: No chest wall mass or suspicious bone lesions identified. CT ABDOMEN PELVIS FINDINGS Hepatobiliary: No focal liver abnormality is seen. Status post cholecystectomy. Postoperative biliary dilatation. Pancreas:  Unremarkable. No pancreatic ductal dilatation or surrounding inflammatory changes. Spleen: Normal in size without significant abnormality. Adrenals/Urinary Tract: Adrenal glands are unremarkable. Kidneys are normal, without renal calculi, solid lesion, or hydronephrosis. Bladder is unremarkable. Stomach/Bowel: Stomach is within normal limits. Appendix appears normal. No evidence of bowel wall thickening, distention, or inflammatory changes. Sigmoid diverticulosis. Vascular/Lymphatic: Aortic atherosclerosis. No enlarged abdominal or pelvic lymph nodes. Reproductive: Calcified uterine fibroids. Other: No abdominal wall hernia or abnormality. No abdominopelvic ascites. Musculoskeletal: No acute or significant osseous findings. IMPRESSION: 1. Interval increase in consolidation and volume loss of the right middle lobe, consistent with continued treatment response and developing fibrosis. 2. No evidence of recurrent or metastatic disease within the chest, abdomen, or pelvis. 3. Emphysema (ICD10-J43.9). 4. Coronary artery disease. Aortic Atherosclerosis (ICD10-I70.0). Electronically Signed   By: Eddie Candle M.D.   On: 10/11/2019 09:26   CT Angio Chest PE W and/or Wo Contrast  Result Date: 11/05/2019 CLINICAL DATA:  Pulmonary embolism, dyspnea, tachycardia, lung cancer EXAM: CT ANGIOGRAPHY CHEST WITH CONTRAST TECHNIQUE: Multidetector CT imaging of the chest was performed using the standard protocol during bolus administration of intravenous contrast. Multiplanar CT image reconstructions and MIPs were obtained to evaluate the vascular anatomy. CONTRAST:  70mL OMNIPAQUE IOHEXOL 350 MG/ML SOLN COMPARISON:  10/11/2019 FINDINGS: Cardiovascular: Satisfactory opacification of the pulmonary arteries to the segmental level. No evidence of pulmonary embolism. There is central pulmonary arterial enlargement in keeping with pulmonary artery hypertension. Left ventricular hypertrophy is noted. Global cardiac size is within normal  limits. Moderate coronary artery calcification noted. Mild atherosclerotic calcification is seen within the aortic arch. No aneurysm. Arch vasculature appears widely patent proximally. Mediastinum/Nodes: No pathologic thoracic adenopathy.Asymmetric soft tissue within the upper outer quadrant of the right breast is not well assessed on this examination, but appears stable since examinations dating as far back as 12/07/2017. Lungs/Pleura: Severe centrilobular emphysema with associated pulmonary hyperinflation is again identified. Chronic right middle lobe collapse is unchanged. There has developed progressive peribronchial nodularity within the anterior left upper lobe possibly infectious in the acute setting. There is progressive bronchial wall thickening, best noted within the right lower lobe suggesting acute on chronic airway inflammation. No central obstructing lesion. Upper Abdomen: Unremarkable Musculoskeletal: No acute bone abnormality. Review of the MIP images confirms the above findings. IMPRESSION: No pulmonary embolism. Severe centrilobular emphysema. Superimposed new peribronchial nodularity and acute on chronic airway inflammation most in keeping with acute infection or inflammation. Left ventricular hypertrophy. Morphologic changes in keeping with pulmonary arterial hypertension. Aortic Atherosclerosis (ICD10-I70.0) and Emphysema (ICD10-J43.9). Electronically Signed   By: Fidela Salisbury MD   On: 11/05/2019 19:03   CT Abdomen Pelvis W Contrast  Result Date: 10/11/2019 CLINICAL DATA:  Non-small cell lung cancer restaging EXAM: CT CHEST, ABDOMEN, AND PELVIS WITH CONTRAST TECHNIQUE: Multidetector CT imaging of the chest, abdomen and  pelvis was performed following the standard protocol during bolus administration of intravenous contrast. CONTRAST:  141mL OMNIPAQUE IOHEXOL 300 MG/ML SOLN, additional oral enteric contrast COMPARISON:  07/05/2019, 04/03/2019 FINDINGS: CT CHEST FINDINGS Cardiovascular:  Aortic atherosclerosis. Normal heart size. Three-vessel coronary artery calcifications. No pericardial effusion. Mediastinum/Nodes: No enlarged mediastinal, hilar, or axillary lymph nodes. Thyroid gland, trachea, and esophagus demonstrate no significant findings. Lungs/Pleura: Severe centrilobular emphysema. Interval increase in consolidation and volume loss of the right middle lobe, now with almost complete fibrotic consolidation (series 6, image 106). Bandlike scarring of the bilateral lung bases. No pleural effusion or pneumothorax. Musculoskeletal: No chest wall mass or suspicious bone lesions identified. CT ABDOMEN PELVIS FINDINGS Hepatobiliary: No focal liver abnormality is seen. Status post cholecystectomy. Postoperative biliary dilatation. Pancreas: Unremarkable. No pancreatic ductal dilatation or surrounding inflammatory changes. Spleen: Normal in size without significant abnormality. Adrenals/Urinary Tract: Adrenal glands are unremarkable. Kidneys are normal, without renal calculi, solid lesion, or hydronephrosis. Bladder is unremarkable. Stomach/Bowel: Stomach is within normal limits. Appendix appears normal. No evidence of bowel wall thickening, distention, or inflammatory changes. Sigmoid diverticulosis. Vascular/Lymphatic: Aortic atherosclerosis. No enlarged abdominal or pelvic lymph nodes. Reproductive: Calcified uterine fibroids. Other: No abdominal wall hernia or abnormality. No abdominopelvic ascites. Musculoskeletal: No acute or significant osseous findings. IMPRESSION: 1. Interval increase in consolidation and volume loss of the right middle lobe, consistent with continued treatment response and developing fibrosis. 2. No evidence of recurrent or metastatic disease within the chest, abdomen, or pelvis. 3. Emphysema (ICD10-J43.9). 4. Coronary artery disease. Aortic Atherosclerosis (ICD10-I70.0). Electronically Signed   By: Eddie Candle M.D.   On: 10/11/2019 09:26   DG Chest Portable 1  View  Result Date: 11/05/2019 CLINICAL DATA:  Dyspnea EXAM: PORTABLE CHEST 1 VIEW COMPARISON:  10/20/2019 FINDINGS: Changes of bullous emphysema are again identified with prominent bulla formation within the right apex and hyperinflation of the lungs. Mild bibasilar atelectasis. Stable right infrahilar opacity related to middle lobe collapse and consolidation, better seen on CT examination of 10/11/2019. No focal pulmonary nodule or infiltrate. No pneumothorax or pleural effusion. Cardiac size within normal limits. IMPRESSION: Stable pulmonary hyperinflation secondary to bullous emphysema. Unchanged right infrahilar opacity representing chronic right middle lobe collapse. No acute cardiopulmonary process identified. Emphysema (ICD10-J43.9). Electronically Signed   By: Fidela Salisbury MD   On: 11/05/2019 15:31   DG Chest Portable 1 View  Result Date: 10/20/2019 CLINICAL DATA:  Shortness of breath. EXAM: PORTABLE CHEST 1 VIEW COMPARISON:  CT 10/11/2019.  Chest x-ray 10/03/2019. FINDINGS: Mediastinum and hilar structures normal. Heart size normal. COPD. Mild bibasilar subsegmental atelectasis. Tiny left pleural effusion cannot be excluded. IMPRESSION: COPD. Mild bibasilar subsegmental atelectasis and or scarring. Tiny left pleural effusion cannot be excluded. Electronically Signed   By: Marcello Moores  Register   On: 10/20/2019 07:43      Discharge Exam: Vitals:   11/09/19 1000 11/09/19 1100  BP: (!) 137/86 (!) 131/94  Pulse: 74 82  Resp: (!) 24 (!) 27  Temp:    SpO2: 97% 96%   Vitals:   11/09/19 0800 11/09/19 0900 11/09/19 1000 11/09/19 1100  BP: (!) 136/87 (!) 146/77 (!) 137/86 (!) 131/94  Pulse: 80 100 74 82  Resp: (!) 24 (!) 34 (!) 24 (!) 27  Temp: 98 F (36.7 C)     TempSrc: Axillary     SpO2: 97% 94% 97% 96%  Weight:      Height:        General: Pt is alert, awake, not in acute  distress Cardiovascular: RRR, S1/S2 +, no rubs, no gallops Respiratory: CTA bilaterally, no wheezing, no  rhonchi, globally diminished breath sounds, likely her baseline due to COPD Abdominal: Soft, NT, ND, bowel sounds + Extremities: no edema, no cyanosis    The results of significant diagnostics from this hospitalization (including imaging, microbiology, ancillary and laboratory) are listed below for reference.     Microbiology: Recent Results (from the past 240 hour(s))  SARS Coronavirus 2 by RT PCR (hospital order, performed in [facility] hospital lab) Nasopharyngeal Nasopharyngeal Swab     Status: None   Collection Time: 11/05/19  3:08 PM   Specimen: Nasopharyngeal Swab  Result Value Ref Range Status   SARS Coronavirus 2 NEGATIVE NEGATIVE Final    Comment: (NOTE) SARS-CoV-2 target nucleic acids are NOT DETECTED.  The SARS-CoV-2 RNA is generally detectable in upper and lower respiratory specimens during the acute phase of infection. The lowest concentration of SARS-CoV-2 viral copies this assay can detect is 250 copies / mL. A negative result does not preclude SARS-CoV-2 infection and should not be used as the sole basis for treatment or other patient management decisions.  A negative result may occur with improper specimen collection / handling, submission of specimen other than nasopharyngeal swab, presence of viral mutation(s) within the areas targeted by this assay, and inadequate number of viral copies (<250 copies / mL). A negative result must be combined with clinical observations, patient history, and epidemiological information.  Fact Sheet for Patients:   StrictlyIdeas.no  Fact Sheet for Healthcare Providers: BankingDealers.co.za  This test is not yet approved or  cleared by the [place] FDA and has been authorized for detection and/or diagnosis of SARS-CoV-2 by FDA under an Emergency Use Authorization (EUA).  This EUA will remain in effect (meaning this test can be used) for the duration of the COVID-19  declaration under Section 564(b)(1) of the Act, 21 U.S.C. section 360bbb-3(b)(1), unless the authorization is terminated or revoked sooner.  Performed at [facility], [address] [place], [place] [address]   MRSA PCR Screening     Status: None   Collection Time: 11/05/19  9:31 PM   Specimen: Nasal Mucosa; Nasopharyngeal  Result Value Ref Range Status   MRSA by PCR NEGATIVE NEGATIVE Final    Comment:        The GeneXpert MRSA Assay (FDA approved for NASAL specimens only), is one component of a comprehensive MRSA colonization surveillance program. It is not intended to diagnose MRSA infection nor to guide or monitor treatment for MRSA infections. Performed at [facility], [address] [place], [place] [address]      Labs: BNP (last 3 results) Recent Labs    11/05/19 1526  BNP 09.8   Basic Metabolic Panel: Recent Labs  Lab 11/05/19 1526 11/06/19 0458  NA 142 142  K 4.2 4.9  CL 102 101  CO2 25 31  GLUCOSE 112* 133*  BUN 14 21  CREATININE 0.66 0.65  CALCIUM 9.6 9.4  MG  --  1.8   Liver Function Tests: Recent Labs  Lab 11/05/19 1526 11/06/19 0458  AST 20 18  ALT 17 16  ALKPHOS 181* 146*  BILITOT 0.6 0.3  PROT 7.4 6.8  ALBUMIN 3.9 3.5   No results for input(s): LIPASE, AMYLASE in the last 168 hours. No results for input(s): AMMONIA in the last 168 hours. CBC: Recent Labs  Lab 11/05/19 1526 11/06/19 0458  WBC 10.1 7.1  NEUTROABS  --  6.2  HGB 14.1 13.1  HCT 42.5 39.4  MCV 87.8 88.1  PLT 222 218   Cardiac Enzymes: No results for input(s): CKTOTAL, CKMB, CKMBINDEX, TROPONINI in the last 168 hours. BNP: Invalid input(s): POCBNP CBG: No results for input(s): GLUCAP in the last 168 hours. D-Dimer No results for input(s): DDIMER in the last 72 hours. Hgb A1c No results for input(s): HGBA1C in the last 72 hours. Lipid Profile No results for input(s): CHOL, HDL, LDLCALC, TRIG, CHOLHDL, LDLDIRECT in the last 72 hours. Thyroid function  studies No results for input(s): TSH, T4TOTAL, T3FREE, THYROIDAB in the last 72 hours.  Invalid input(s): FREET3 Anemia work up No results for input(s): VITAMINB12, FOLATE, FERRITIN, TIBC, IRON, RETICCTPCT in the last 72 hours. Urinalysis    Component Value Date/Time   COLORURINE YELLOW 05/05/2018 [place] 05/05/2018 1539   LABSPEC 1.006 05/05/2018 1539   PHURINE 6.0 05/05/2018 1539   GLUCOSEU NEGATIVE 05/05/2018 1539   HGBUR NEGATIVE 05/05/2018 1539   BILIRUBINUR NEGATIVE 05/05/2018 1539   KETONESUR NEGATIVE 05/05/2018 1539   PROTEINUR NEGATIVE 05/05/2018 1539   UROBILINOGEN 0.2 03/31/2014 1406   NITRITE NEGATIVE 05/05/2018 1539   LEUKOCYTESUR NEGATIVE 05/05/2018 1539   Sepsis Labs Invalid input(s): PROCALCITONIN,  WBC,  LACTICIDVEN Microbiology Recent Results (from the past 240 hour(s))  SARS Coronavirus 2 by RT PCR (hospital order, performed in [place] hospital lab) Nasopharyngeal Nasopharyngeal Swab     Status: None   Collection Time: 11/05/19  3:08 PM   Specimen: Nasopharyngeal Swab  Result Value Ref Range Status   SARS Coronavirus 2 NEGATIVE NEGATIVE Final    Comment: (NOTE) SARS-CoV-2 target nucleic acids are NOT DETECTED.  The SARS-CoV-2 RNA is generally detectable in upper and lower respiratory specimens during the acute phase of infection. The lowest concentration of SARS-CoV-2 viral copies this assay can detect is 250 copies / mL. A negative result does not preclude SARS-CoV-2 infection and should not be used as the sole basis for treatment or other patient management decisions.  A negative result may occur with improper specimen collection / handling, submission of specimen other than nasopharyngeal swab, presence of viral mutation(s) within the areas targeted by this assay, and inadequate number of viral copies (<250 copies / mL). A negative result must be combined with clinical observations, patient history, and epidemiological  information.  Fact Sheet for Patients:   StrictlyIdeas.no  Fact Sheet for Healthcare Providers: BankingDealers.co.za  This test is not yet approved or  cleared by the [place] FDA and has been authorized for detection and/or diagnosis of SARS-CoV-2 by FDA under an Emergency Use Authorization (EUA).  This EUA will remain in effect (meaning this test can be used) for the duration of the COVID-19 declaration under Section 564(b)(1) of the Act, 21 U.S.C. section 360bbb-3(b)(1), unless the authorization is terminated or revoked sooner.  Performed at [facility], [address], [place], [place] [address]   MRSA PCR Screening     Status: None   Collection Time: 11/05/19  9:31 PM   Specimen: Nasal Mucosa; Nasopharyngeal  Result Value Ref Range Status   MRSA by PCR NEGATIVE NEGATIVE Final    Comment:        The GeneXpert MRSA Assay (FDA approved for NASAL specimens only), is one component of a comprehensive MRSA colonization surveillance program. It is not intended to diagnose MRSA infection nor to guide or monitor treatment for MRSA infections. Performed at [facility], [address], [place], [place] [address]      Time coordinating  discharge: Over 30 minutes  SIGNED:   Darliss Cheney, MD  [facility] 11/09/2019, 11:46 AM  If 7PM-7AM, please contact night-coverage [email or web address]

## 2019-11-09 NOTE — Progress Notes (Signed)
OT Cancellation Note  Patient Details Name: Kynlei Piontek MRN: [id number] DOB: 1950-06-04   Cancelled Treatment:    Reason Eval/Treat Not Completed: OT screened, no needs identified, will sign off. Pt is at baseline with ADL completion and functional mobility tasks. Limited due to SOB and decreased activity tolerance. No further OT services required at this time.   Guadelupe Sabin, OTR/L  [phone] 11/09/2019, 7:18 AM

## 2019-11-10 ENCOUNTER — Inpatient Hospital Stay: Payer: Medicare HMO | Admitting: Internal Medicine

## 2019-11-10 ENCOUNTER — Inpatient Hospital Stay: Payer: Medicare HMO

## 2019-11-15 NOTE — Progress Notes (Deleted)
[place] OFFICE PROGRESS NOTE  Barry Dienes, NP No address on file  DIAGNOSIS: Recurrent non-small cell lung cancer initially diagnosed as stage IIIA (T1b, N2, M0) non-small cell lung cancer presented with right middle lobe pulmonary nodule, mediastinal lymphadenopathy and highly suspicious for small nodule in the left upper lobe that could change her stage to stage IV that could present another synchronous primary lesion in the left upper lobe. This was diagnosed in September 2017.  PRIOR THERAPY:  1) Concurrent chemoradiation with weekly carboplatin for AUC of 2 and paclitaxel 45 MG/M2 status post 6 cycles last dose was given 02/25/2016 with partial response. 2) Consolidation chemotherapy with carboplatin for AUC of 5 and paclitaxel 175 MG/M2 every 3 weeks with Neulasta support. First dose 04/22/2016. Status post 3 cycles  CURRENT THERAPY: Second line treatment with immunotherapy with Nivolumab 480 mg IV every 4 weeks status post 31cycles  INTERVAL HISTORY: Courtney Zuniga 69 y.o. female returns to the clinic today for follow-up visit.  The patient is feeling _today without any concerning complaints except for _.  The patient's last cycle of treatment was in May 2021.  Since this time, the patient has been hospitalized on multiple occasions for a COPD exacerbation.  The most recent hospital admission was on 11/05/2019 to 11/09/2019.  Since being discharged from the hospital, the patient is feeling _.   Regarding her treatment with immunotherapy with nivolumab, the patient has been tolerating this well without any adverse side effects.  She denies any recent fever, chills, night sweats, or weight loss.  She denies any nausea, vomiting, diarrhea, or constipation.  She denies any hemoptysis or chest pain.  She reports her baseline dyspnea on exertion and cough.  She is on oxygen via nasal cannula at baseline.  She takes scheduled nebulizer treatments.  Prednisone?  She reports her  baseline chronic headaches secondary to having a brain tumor in 2005.  She denies any rashes or skin changes.  She is here today for evaluation before starting cycle #32.  MEDICAL HISTORY: Past Medical History:  Diagnosis Date  . Anemia    as a young woman  . Arthritis    osteoartritis  . Asthma   . Brain tumor (benign) ([place]) 2005 [facility]   Benign  . Chronic headaches   . Chronic hip pain   . Chronic pain   . COPD (chronic obstructive pulmonary disease) ([place])   . Coronary artery disease   . Depression   . Depression 05/15/2016  . Encounter for antineoplastic chemotherapy 01/10/2016  . GERD (gastroesophageal reflux disease)   . Hypertension   . Lung cancer ([place]) dx'd 01/2016   currently on chemo and radiation   . NSTEMI (non-ST elevated myocardial infarction) ([place]) yrs ago  . On home O2    qhs 2 liters at hs and prn  . Pneumonia last time 2 yrs ago  . Shortness of breath dyspnea    with activity    ALLERGIES:  has No Known Allergies.  MEDICATIONS:  Current Outpatient Medications  Medication Sig Dispense Refill  . acetaminophen (TYLENOL) 325 MG tablet Take 2 tablets (650 mg total) by mouth every 6 (six) hours as needed for mild pain, fever or headache. (Patient not taking: Reported on 05/12/2019) 12 tablet 2  . albuterol (PROVENTIL HFA;VENTOLIN HFA) 108 (90 BASE) MCG/ACT inhaler Inhale 2 puffs into the lungs every 4 (four) hours as needed for shortness of breath.     Marland Kitchen azithromycin (ZITHROMAX) 250 MG tablet Take 1 tablet (  250 mg total) by mouth daily. 4 each 0  . BREO ELLIPTA 100-25 MCG/INH AEPB Inhale 1 puff into the lungs daily.     . Cholecalciferol (VITAMIN D3) 125 MCG (5000 UT) CAPS Take 1 capsule by mouth daily.  0  . cyclobenzaprine (FLEXERIL) 10 MG tablet Take 1 tablet (10 mg total) by mouth 2 (two) times daily. *MAy take one additional tablet as needed for muscle spasms (Patient not taking: Reported on 10/20/2019) 60 tablet 2  . dextromethorphan-guaiFENesin (MUCINEX DM)  30-600 MG 12hr tablet Take 1 tablet by mouth 2 (two) times daily. (Patient not taking: Reported on 10/20/2019) 40 tablet 0  . diclofenac sodium (VOLTAREN) 1 % GEL Apply topically 4 (four) times daily as needed (massge gel into affected area(s) as needed for pain).   1  . ENSURE (ENSURE) Take 237 mLs by mouth 3 (three) times daily between meals.    Marland Kitchen ipratropium-albuterol (DUONEB) 0.5-2.5 (3) MG/3ML SOLN Take 3 mLs by nebulization 3 (three) times daily. 360 mL 2  . methocarbamol (ROBAXIN) 500 MG tablet Take 1 tablet (500 mg total) by mouth every 8 (eight) hours as needed for muscle spasms. 30 tablet 0  . metoprolol tartrate (LOPRESSOR) 25 MG tablet Take 1 tablet (25 mg total) by mouth 2 (two) times daily. 60 tablet 1  . nicotine (NICODERM CQ - DOSED IN MG/24 HOURS) 14 mg/24hr patch Place 1 patch (14 mg total) onto the skin daily. 28 patch 0  . oxyCODONE-acetaminophen (PERCOCET/ROXICET) 5-325 MG tablet Take 1 tablet by mouth every 8 (eight) hours as needed for moderate pain. (Patient not taking: Reported on 05/12/2019) 12 tablet 0  . pantoprazole (PROTONIX) 40 MG tablet Take 1 tablet (40 mg total) by mouth daily. 30 tablet 3  . ROBITUSSIN 12 HOUR COUGH 30 MG/5ML liquid Take 30 mg by mouth every 4 (four) hours as needed for cough.     . topiramate (TOPAMAX) 25 MG tablet Take 1 tablet by mouth daily at bedtime X 1 week; then increase to 25mg  BID. (Patient not taking: Reported on 10/20/2019) 45 tablet 2   No current facility-administered medications for this visit.    SURGICAL HISTORY:  Past Surgical History:  Procedure Laterality Date  . CHOLECYSTECTOMY    . COLONOSCOPY  2015   Results requested from [facility]  . COLONOSCOPY    . ESOPHAGOGASTRODUODENOSCOPY N/A 08/14/2015   Procedure: ESOPHAGOGASTRODUODENOSCOPY (EGD);  Surgeon: Daneil Dolin, MD;  Location: [facility];  Service: Endoscopy;  Laterality: N/A;  215   . ESOPHAGOGASTRODUODENOSCOPY (EGD) WITH PROPOFOL N/A 09/13/2015    Procedure: ESOPHAGOGASTRODUODENOSCOPY (EGD) WITH PROPOFOL;  Surgeon: Milus Banister, MD;  Location: [facility];  Service: Endoscopy;  Laterality: N/A;  . EUS N/A 03/12/2017   Procedure: UPPER ENDOSCOPIC ULTRASOUND (EUS) RADIAL;  Surgeon: Milus Banister, MD;  Location: [facility] ENDOS[facility];  Service: Endoscopy;  Laterality: N/A;  . TUMOR REMOVAL  2005   Benign  . UPPER ESOPHAGEAL ENDOSCOPIC ULTRASOUND (EUS)  09/13/2015   Procedure: UPPER ESOPHAGEAL ENDOSCOPIC ULTRASOUND (EUS);  Surgeon: Milus Banister, MD;  Location: [facility];  Service: Endoscopy;;  . VIDEO BRONCHOSCOPY WITH ENDOBRONCHIAL NAVIGATION N/A 12/31/2015   Procedure: VIDEO BRONCHOSCOPY WITH ENDOBRONCHIAL NAVIGATION;  Surgeon: Melrose Nakayama, MD;  Location: [facility];  Service: Thoracic;  Laterality: N/A;  . VIDEO BRONCHOSCOPY WITH ENDOBRONCHIAL ULTRASOUND N/A 11/08/2015   Procedure: VIDEO BRONCHOSCOPY WITH ENDOBRONCHIAL ULTRASOUND;  Surgeon: Ivin Poot, MD;  Location: [facility];  Service: Thoracic;  Laterality: N/A;  REVIEW OF SYSTEMS:   Review of Systems  Constitutional: Negative for appetite change, chills, fatigue, fever and unexpected weight change.  HENT:   Negative for mouth sores, nosebleeds, sore throat and trouble swallowing.   Eyes: Negative for eye problems and icterus.  Respiratory: Negative for cough, hemoptysis, shortness of breath and wheezing.   Cardiovascular: Negative for chest pain and leg swelling.  Gastrointestinal: Negative for abdominal pain, constipation, diarrhea, nausea and vomiting.  Genitourinary: Negative for bladder incontinence, difficulty urinating, dysuria, frequency and hematuria.   Musculoskeletal: Negative for back pain, gait problem, neck pain and neck stiffness.  Skin: Negative for itching and rash.  Neurological: Negative for dizziness, extremity weakness, gait problem, headaches, light-headedness and seizures.  Hematological: Negative for adenopathy. Does not bruise/bleed easily.   Psychiatric/Behavioral: Negative for confusion, depression and sleep disturbance. The patient is not nervous/anxious.     PHYSICAL EXAMINATION:  There were no vitals taken for this visit.  ECOG PERFORMANCE STATUS: {CHL ONC ECOG [id number]  Physical Exam  Constitutional: Oriented to person, place, and time and well-developed, well-nourished, and in no distress. No distress.  HENT:  Head: Normocephalic and atraumatic.  Mouth/Throat: Oropharynx is clear and moist. No oropharyngeal exudate.  Eyes: Conjunctivae are normal. Right eye exhibits no discharge. Left eye exhibits no discharge. No scleral icterus.  Neck: Normal range of motion. Neck supple.  Cardiovascular: Normal rate, regular rhythm, normal heart sounds and intact distal pulses.   Pulmonary/Chest: Effort normal and breath sounds normal. No respiratory distress. No wheezes. No rales.  Abdominal: Soft. Bowel sounds are normal. Exhibits no distension and no mass. There is no tenderness.  Musculoskeletal: Normal range of motion. Exhibits no edema.  Lymphadenopathy:    No cervical adenopathy.  Neurological: Alert and oriented to person, place, and time. Exhibits normal muscle tone. Gait normal. Coordination normal.  Skin: Skin is warm and dry. No rash noted. Not diaphoretic. No erythema. No pallor.  Psychiatric: Mood, memory and judgment normal.  Vitals reviewed.  LABORATORY DATA: Lab Results  Component Value Date   WBC 7.1 11/06/2019   HGB 13.1 11/06/2019   HCT 39.4 11/06/2019   MCV 88.1 11/06/2019   PLT 218 11/06/2019      Chemistry      Component Value Date/Time   NA 142 11/06/2019 0458   NA 144 04/09/2017 0948   K 4.9 11/06/2019 0458   K 4.3 04/09/2017 0948   CL 101 11/06/2019 0458   CO2 31 11/06/2019 0458   CO2 25 04/09/2017 0948   BUN 21 11/06/2019 0458   BUN 16.8 04/09/2017 0948   CREATININE 0.65 11/06/2019 0458   CREATININE 0.64 10/06/2019 0822   CREATININE 0.8 04/09/2017 0948      Component Value  Date/Time   CALCIUM 9.4 11/06/2019 0458   CALCIUM 9.1 04/09/2017 0948   ALKPHOS 146 (H) 11/06/2019 0458   ALKPHOS 216 (H) 04/09/2017 0948   AST 18 11/06/2019 0458   AST 24 10/06/2019 0822   AST 12 04/09/2017 0948   ALT 16 11/06/2019 0458   ALT 25 10/06/2019 0822   ALT 13 04/09/2017 0948   BILITOT 0.3 11/06/2019 0458   BILITOT <0.2 (L) 10/06/2019 0822   BILITOT 0.22 04/09/2017 0948       RADIOGRAPHIC STUDIES:  CT Angio Chest PE W and/or Wo Contrast  Result Date: 11/05/2019 CLINICAL DATA:  Pulmonary embolism, dyspnea, tachycardia, lung cancer EXAM: CT ANGIOGRAPHY CHEST WITH CONTRAST TECHNIQUE: Multidetector CT imaging of the chest was performed using the standard protocol during bolus  administration of intravenous contrast. Multiplanar CT image reconstructions and MIPs were obtained to evaluate the vascular anatomy. CONTRAST:  54mL OMNIPAQUE IOHEXOL 350 MG/ML SOLN COMPARISON:  10/11/2019 FINDINGS: Cardiovascular: Satisfactory opacification of the pulmonary arteries to the segmental level. No evidence of pulmonary embolism. There is central pulmonary arterial enlargement in keeping with pulmonary artery hypertension. Left ventricular hypertrophy is noted. Global cardiac size is within normal limits. Moderate coronary artery calcification noted. Mild atherosclerotic calcification is seen within the aortic arch. No aneurysm. Arch vasculature appears widely patent proximally. Mediastinum/Nodes: No pathologic thoracic adenopathy.Asymmetric soft tissue within the upper outer quadrant of the right breast is not well assessed on this examination, but appears stable since examinations dating as far back as 12/07/2017. Lungs/Pleura: Severe centrilobular emphysema with associated pulmonary hyperinflation is again identified. Chronic right middle lobe collapse is unchanged. There has developed progressive peribronchial nodularity within the anterior left upper lobe possibly infectious in the acute setting.  There is progressive bronchial wall thickening, best noted within the right lower lobe suggesting acute on chronic airway inflammation. No central obstructing lesion. Upper Abdomen: Unremarkable Musculoskeletal: No acute bone abnormality. Review of the MIP images confirms the above findings. IMPRESSION: No pulmonary embolism. Severe centrilobular emphysema. Superimposed new peribronchial nodularity and acute on chronic airway inflammation most in keeping with acute infection or inflammation. Left ventricular hypertrophy. Morphologic changes in keeping with pulmonary arterial hypertension. Aortic Atherosclerosis (ICD10-I70.0) and Emphysema (ICD10-J43.9). Electronically Signed   By: Fidela Salisbury MD   On: 11/05/2019 19:03   DG Chest Portable 1 View  Result Date: 11/05/2019 CLINICAL DATA:  Dyspnea EXAM: PORTABLE CHEST 1 VIEW COMPARISON:  10/20/2019 FINDINGS: Changes of bullous emphysema are again identified with prominent bulla formation within the right apex and hyperinflation of the lungs. Mild bibasilar atelectasis. Stable right infrahilar opacity related to middle lobe collapse and consolidation, better seen on CT examination of 10/11/2019. No focal pulmonary nodule or infiltrate. No pneumothorax or pleural effusion. Cardiac size within normal limits. IMPRESSION: Stable pulmonary hyperinflation secondary to bullous emphysema. Unchanged right infrahilar opacity representing chronic right middle lobe collapse. No acute cardiopulmonary process identified. Emphysema (ICD10-J43.9). Electronically Signed   By: Fidela Salisbury MD   On: 11/05/2019 15:31   DG Chest Portable 1 View  Result Date: 10/20/2019 CLINICAL DATA:  Shortness of breath. EXAM: PORTABLE CHEST 1 VIEW COMPARISON:  CT 10/11/2019.  Chest x-ray 10/03/2019. FINDINGS: Mediastinum and hilar structures normal. Heart size normal. COPD. Mild bibasilar subsegmental atelectasis. Tiny left pleural effusion cannot be excluded. IMPRESSION: COPD. Mild bibasilar  subsegmental atelectasis and or scarring. Tiny left pleural effusion cannot be excluded. Electronically Signed   By: Marcello Moores  Register   On: 10/20/2019 07:43     ASSESSMENT/PLAN:  This is a very pleasant61 year old African-American female with recurrent metastatic non-small cell lung cancer, adenocarcinoma of the right middle lobe. She was initially diagnosed as stage IIIa in September 2017.   She completed concurrent chemoradiation followed by consolidation with carboplatin and paclitaxel. She is status post3 cycles. The patient was on observation but a restagingCTscan showed evidence of disease progression.  Sheis currently undergoing treatment withsecond line with immunotherapy with nivolumab 480 mg IV every 4 weeks. She is status post31cycles.  The patient has been hospitalized 3 times in July 2021 for a COPD exacerbation. She is feeling _ today.   The patient was seen with Dr. Julien Nordmann. Labs were reviewed. Dr. Julien Nordmann personally and independently reviewed the patient's CT scan from last month and discussed the results with the patient. The  scan showed _. Dr. Julien Nordmann recommends _. She will _ with cycle #32 today as scheduled.   We will see her back for a follow up visit in _ week for evaluation before starting cycle #_    The patient was advised to call immediately if she has any concerning symptoms in the interval. The patient voices understanding of current disease status and treatment options and is in agreement with the current care plan. All questions were answered. The patient knows to call the clinic with any problems, questions or concerns. We can certainly see the patient much sooner if necessary   No orders of the defined types were placed in this encounter.    Jaidy Cottam L Dacie Mandel, PA-C 11/15/19

## 2019-11-17 ENCOUNTER — Inpatient Hospital Stay: Payer: Medicare HMO | Admitting: Physician Assistant

## 2019-11-17 ENCOUNTER — Inpatient Hospital Stay: Payer: Medicare HMO

## 2019-11-17 ENCOUNTER — Telehealth: Payer: Self-pay | Admitting: Medical Oncology

## 2019-11-17 NOTE — Telephone Encounter (Signed)
Appts confirmed for next. She just finished steroids. She is seeing a pulmonary provider next tuesday for possible CPAP.

## 2019-11-23 NOTE — Progress Notes (Signed)
[facility] OFFICE PROGRESS NOTE  Barry Dienes, NP No address on file  DIAGNOSIS: Recurrent non-small cell lung cancer initially diagnosed as stage IIIA (T1b, N2, M0) non-small cell lung cancer presented with right middle lobe pulmonary nodule, mediastinal lymphadenopathy and highly suspicious for small nodule in the left upper lobe that could change her stage to stage IV that could present another synchronous primary lesion in the left upper lobe. This was diagnosed in September 2017.  PRIOR THERAPY: 1) Concurrent chemoradiation with weekly carboplatin for AUC of 2 and paclitaxel 45 MG/M2 status post 6 cycles last dose was given 02/25/2016 with partial response. 2) Consolidation chemotherapy with carboplatin for AUC of 5 and paclitaxel 175 MG/M2 every 3 weeks with Neulasta support. First dose 04/22/2016. Status post 3 cycles  CURRENT THERAPY: Second line treatment with immunotherapy with Nivolumab 480 mg IV every 4 weeks status post 31cycles  INTERVAL HISTORY: Courtney Zuniga 69 y.o. female returns to the clinic today for a follow up visit. The patient was hospitalized twice for a COPD exacerbation. Her first admission was from 10/20/19-10/23/19. Her second admission was from  11/05/19-11/09/19. She was treated with bronchodilators, nebulizer, and IV solumedrol. She had an appointment with her pulmonologist yesterday who discussed smoking cessation as well as adjusting her inhalers. He is arranging for PFTs and ABG. She is on continuous oxygen 3L via nasal cannula for her COPD. She is expected to follow up on 12/02/19 for PFTs. He is working on getting her a CPAP.  She is working on smoking cessation and quit smoking about 1 week ago. She still is having significant shortness of breath with exertion and cough. The patient tolerates immunotherapy with Nivolumab well without any adverse effects. Denies any hemoptysis or chest pain. Denies any nausea, vomiting, or diarrhea. She reports her  baseline constipation for which she uses a laxative if needed. She experiences a chronic headaches.She had a history of a brain tumor in 2005. Denies any rashes or skin changes. The patient is here today for evaluation prior to starting cycle # 32   MEDICAL HISTORY: Past Medical History:  Diagnosis Date  . Anemia    as a young woman  . Arthritis    osteoartritis  . Asthma   . Brain tumor (benign) ([place]) 2005 [facility]   Benign  . Chronic headaches   . Chronic hip pain   . Chronic pain   . COPD (chronic obstructive pulmonary disease) ([place])   . Coronary artery disease   . Depression   . Depression 05/15/2016  . Encounter for antineoplastic chemotherapy 01/10/2016  . GERD (gastroesophageal reflux disease)   . Hypertension   . Lung cancer ([place]) dx'd 01/2016   currently on chemo and radiation   . NSTEMI (non-ST elevated myocardial infarction) ([place]) yrs ago  . On home O2    qhs 2 liters at hs and prn  . Pneumonia last time 2 yrs ago  . Shortness of breath dyspnea    with activity    ALLERGIES:  has No Known Allergies.  MEDICATIONS:  Current Outpatient Medications  Medication Sig Dispense Refill  . albuterol (PROVENTIL HFA;VENTOLIN HFA) 108 (90 BASE) MCG/ACT inhaler Inhale 2 puffs into the lungs every 4 (four) hours as needed for shortness of breath.     Marland Kitchen azithromycin (ZITHROMAX) 250 MG tablet Take 1 tablet (250 mg total) by mouth daily. 4 each 0  . BREO ELLIPTA 100-25 MCG/INH AEPB Inhale 1 puff into the lungs daily.     Marland Kitchen  Cholecalciferol (VITAMIN D3) 125 MCG (5000 UT) CAPS Take 1 capsule by mouth daily.  0  . diclofenac sodium (VOLTAREN) 1 % GEL Apply topically 4 (four) times daily as needed (massge gel into affected area(s) as needed for pain).   1  . doxycycline (VIBRAMYCIN) 100 MG capsule Take 1 capsule by mouth daily.    Marland Kitchen ENSURE (ENSURE) Take 237 mLs by mouth 3 (three) times daily between meals.    . Ipratropium-Albuterol (COMBIVENT) 20-100 MCG/ACT AERS respimat Inhale 2 puffs  into the lungs 4 (four) times daily as needed.    Marland Kitchen ipratropium-albuterol (DUONEB) 0.5-2.5 (3) MG/3ML SOLN Take 3 mLs by nebulization 3 (three) times daily. 360 mL 2  . methocarbamol (ROBAXIN) 500 MG tablet Take 1 tablet (500 mg total) by mouth every 8 (eight) hours as needed for muscle spasms. 30 tablet 0  . metoprolol tartrate (LOPRESSOR) 25 MG tablet Take 1 tablet (25 mg total) by mouth 2 (two) times daily. 60 tablet 1  . nicotine (NICODERM CQ - DOSED IN MG/24 HOURS) 14 mg/24hr patch Place 1 patch (14 mg total) onto the skin daily. 28 patch 0  . oxyCODONE-acetaminophen (PERCOCET/ROXICET) 5-325 MG tablet Take 1 tablet by mouth every 8 (eight) hours as needed for moderate pain. 12 tablet 0  . pantoprazole (PROTONIX) 40 MG tablet Take 1 tablet (40 mg total) by mouth daily. 30 tablet 3  . predniSONE (DELTASONE) 20 MG tablet Take 1 tablet by mouth daily.    . ROBITUSSIN 12 HOUR COUGH 30 MG/5ML liquid Take 30 mg by mouth every 4 (four) hours as needed for cough.     . topiramate (TOPAMAX) 25 MG tablet Take 1 tablet by mouth daily at bedtime X 1 week; then increase to 25mg  BID. 45 tablet 2  . acetaminophen (TYLENOL) 325 MG tablet Take 2 tablets (650 mg total) by mouth every 6 (six) hours as needed for mild pain, fever or headache. (Patient not taking: Reported on 05/12/2019) 12 tablet 2  . cyclobenzaprine (FLEXERIL) 10 MG tablet Take 1 tablet (10 mg total) by mouth 2 (two) times daily. *MAy take one additional tablet as needed for muscle spasms (Patient not taking: Reported on 10/20/2019) 60 tablet 2  . dextromethorphan-guaiFENesin (MUCINEX DM) 30-600 MG 12hr tablet Take 1 tablet by mouth 2 (two) times daily. (Patient not taking: Reported on 10/20/2019) 40 tablet 0   No current facility-administered medications for this visit.    SURGICAL HISTORY:  Past Surgical History:  Procedure Laterality Date  . CHOLECYSTECTOMY    . COLONOSCOPY  2015   Results requested from [facility]  . COLONOSCOPY     . ESOPHAGOGASTRODUODENOSCOPY N/A 08/14/2015   Procedure: ESOPHAGOGASTRODUODENOSCOPY (EGD);  Surgeon: Daneil Dolin, MD;  Location: [facility];  Service: Endoscopy;  Laterality: N/A;  215   . ESOPHAGOGASTRODUODENOSCOPY (EGD) WITH PROPOFOL N/A 09/13/2015   Procedure: ESOPHAGOGASTRODUODENOSCOPY (EGD) WITH PROPOFOL;  Surgeon: Milus Banister, MD;  Location: [facility];  Service: Endoscopy;  Laterality: N/A;  . EUS N/A 03/12/2017   Procedure: UPPER ENDOSCOPIC ULTRASOUND (EUS) RADIAL;  Surgeon: Milus Banister, MD;  Location: [facility];  Service: Endoscopy;  Laterality: N/A;  . TUMOR REMOVAL  2005   Benign  . UPPER ESOPHAGEAL ENDOSCOPIC ULTRASOUND (EUS)  09/13/2015   Procedure: UPPER ESOPHAGEAL ENDOSCOPIC ULTRASOUND (EUS);  Surgeon: Milus Banister, MD;  Location: [facility];  Service: Endoscopy;;  . VIDEO BRONCHOSCOPY WITH ENDOBRONCHIAL NAVIGATION N/A 12/31/2015   Procedure: VIDEO BRONCHOSCOPY WITH ENDOBRONCHIAL NAVIGATION;  Surgeon: Remo Lipps  Chaya Jan, MD;  Location: [facility];  Service: Thoracic;  Laterality: N/A;  . VIDEO BRONCHOSCOPY WITH ENDOBRONCHIAL ULTRASOUND N/A 11/08/2015   Procedure: VIDEO BRONCHOSCOPY WITH ENDOBRONCHIAL ULTRASOUND;  Surgeon: Ivin Poot, MD;  Location: [facility];  Service: Thoracic;  Laterality: N/A;    REVIEW OF SYSTEMS:   Review of Systems  Constitutional: Negative for appetite change, chills, fatigue, fever and unexpected weight change.  HENT: Negative for mouth sores, nosebleeds, sore throat and trouble swallowing.   Eyes: Negative for eye problems and icterus.  Respiratory: Positive for shortness of breath with exertion, intermittent wheezing,and chronic cough.Negative for hemoptysis. Cardiovascular: Negative for chest pain and leg swelling.  Gastrointestinal: Negative for abdominal pain, constipation, diarrhea, nausea and vomiting.  Genitourinary: Negative for bladder incontinence, difficulty urinating, dysuria, frequency and hematuria.   Musculoskeletal:  Negative for back pain, gait problem, neck pain and neck stiffness.  Skin: Negative for itching and rash.  Neurological: Positive for chronic headaches. Negative for dizziness, extremity weakness, gait problem,  light-headedness and seizures.  Hematological: Negative for adenopathy. Does not bruise/bleed easily.  Psychiatric/Behavioral: Negative for confusion, depression and sleep disturbance. The patient is not nervous/anxious.     PHYSICAL EXAMINATION:  Blood pressure 134/82, pulse (!) 110, temperature (!) 96.9 F (36.1 C), temperature source Tympanic, resp. rate 18, height 5\' 7"  (1.702 m), weight 147 lb 3.2 oz (66.8 kg), SpO2 92 %.  ECOG PERFORMANCE STATUS: 1 - Symptomatic but completely ambulatory  Physical Exam  Constitutional: Oriented to person, place, and time and well-developed, well-nourished, and in no distress.  HENT:  Head: Normocephalic and atraumatic.  Mouth/Throat: Oropharynx is clear and moist. No oropharyngeal exudate.  Eyes: Conjunctivae are normal. Right eye exhibits no discharge. Left eye exhibits no discharge. No scleral icterus.  Neck: Normal range of motion. Neck supple.  Cardiovascular: Tachycardic, regular rhythm, normal heart sounds and intact distal pulses.  Pulmonary/Chest: Effort normal and breath soundsquiet in all lung fields with some rhonchi. No respiratory distress. No rales.  Abdominal: Soft. Bowel sounds are normal. Exhibits no distension and no mass. There is no tenderness.  Musculoskeletal: Normal range of motion. Exhibits no edema.  Lymphadenopathy:  No cervical adenopathy.  Neurological: Left facial twitching and weakness (baseline from brain tumor in 2005).Alert and oriented to person, place, and time. Exhibits normal muscle tone. Gait normal. Coordination normal.  Skin: Skin is warm and dry. No rash noted. Not diaphoretic. No erythema. No pallor.  Psychiatric: Mood, memory and judgment normal.  Vitals reviewed.  LABORATORY DATA: Lab  Results  Component Value Date   WBC 7.1 11/24/2019   HGB 12.2 11/24/2019   HCT 36.1 11/24/2019   MCV 85.5 11/24/2019   PLT 242 11/24/2019      Chemistry      Component Value Date/Time   NA 145 11/24/2019 0727   NA 144 04/09/2017 0948   K 3.4 (L) 11/24/2019 0727   K 4.3 04/09/2017 0948   CL 105 11/24/2019 0727   CO2 27 11/24/2019 0727   CO2 25 04/09/2017 0948   BUN 14 11/24/2019 0727   BUN 16.8 04/09/2017 0948   CREATININE 0.81 11/24/2019 0727   CREATININE 0.8 04/09/2017 0948      Component Value Date/Time   CALCIUM 9.4 11/24/2019 0727   CALCIUM 9.1 04/09/2017 0948   ALKPHOS 184 (H) 11/24/2019 0727   ALKPHOS 216 (H) 04/09/2017 0948   AST 17 11/24/2019 0727   AST 12 04/09/2017 0948   ALT 11 11/24/2019 0727   ALT 13 04/09/2017  2025   BILITOT <0.2 (L) 11/24/2019 0727   BILITOT 0.22 04/09/2017 0948       RADIOGRAPHIC STUDIES:  CT Angio Chest PE W and/or Wo Contrast  Result Date: 11/05/2019 CLINICAL DATA:  Pulmonary embolism, dyspnea, tachycardia, lung cancer EXAM: CT ANGIOGRAPHY CHEST WITH CONTRAST TECHNIQUE: Multidetector CT imaging of the chest was performed using the standard protocol during bolus administration of intravenous contrast. Multiplanar CT image reconstructions and MIPs were obtained to evaluate the vascular anatomy. CONTRAST:  63mL OMNIPAQUE IOHEXOL 350 MG/ML SOLN COMPARISON:  10/11/2019 FINDINGS: Cardiovascular: Satisfactory opacification of the pulmonary arteries to the segmental level. No evidence of pulmonary embolism. There is central pulmonary arterial enlargement in keeping with pulmonary artery hypertension. Left ventricular hypertrophy is noted. Global cardiac size is within normal limits. Moderate coronary artery calcification noted. Mild atherosclerotic calcification is seen within the aortic arch. No aneurysm. Arch vasculature appears widely patent proximally. Mediastinum/Nodes: No pathologic thoracic adenopathy.Asymmetric soft tissue within the upper  outer quadrant of the right breast is not well assessed on this examination, but appears stable since examinations dating as far back as 12/07/2017. Lungs/Pleura: Severe centrilobular emphysema with associated pulmonary hyperinflation is again identified. Chronic right middle lobe collapse is unchanged. There has developed progressive peribronchial nodularity within the anterior left upper lobe possibly infectious in the acute setting. There is progressive bronchial wall thickening, best noted within the right lower lobe suggesting acute on chronic airway inflammation. No central obstructing lesion. Upper Abdomen: Unremarkable Musculoskeletal: No acute bone abnormality. Review of the MIP images confirms the above findings. IMPRESSION: No pulmonary embolism. Severe centrilobular emphysema. Superimposed new peribronchial nodularity and acute on chronic airway inflammation most in keeping with acute infection or inflammation. Left ventricular hypertrophy. Morphologic changes in keeping with pulmonary arterial hypertension. Aortic Atherosclerosis (ICD10-I70.0) and Emphysema (ICD10-J43.9). Electronically Signed   By: Fidela Salisbury MD   On: 11/05/2019 19:03   DG Chest Portable 1 View  Result Date: 11/05/2019 CLINICAL DATA:  Dyspnea EXAM: PORTABLE CHEST 1 VIEW COMPARISON:  10/20/2019 FINDINGS: Changes of bullous emphysema are again identified with prominent bulla formation within the right apex and hyperinflation of the lungs. Mild bibasilar atelectasis. Stable right infrahilar opacity related to middle lobe collapse and consolidation, better seen on CT examination of 10/11/2019. No focal pulmonary nodule or infiltrate. No pneumothorax or pleural effusion. Cardiac size within normal limits. IMPRESSION: Stable pulmonary hyperinflation secondary to bullous emphysema. Unchanged right infrahilar opacity representing chronic right middle lobe collapse. No acute cardiopulmonary process identified. Emphysema (ICD10-J43.9).  Electronically Signed   By: Fidela Salisbury MD   On: 11/05/2019 15:31     ASSESSMENT/PLAN:  This is a very pleasant30 year old African-American female with recurrent metastatic non-small cell lung cancer, adenocarcinoma of the right middle lobe. She was initially diagnosed as stage IIIa in September 2017.   She completed concurrent chemoradiation followed by consolidation with carboplatin and paclitaxel. She is status post3 cycles. The patient was on observation but a restagingCTscan showed evidence of disease progression.  Sheis currently undergoing treatment withsecond line with immunotherapy with nivolumab 480 mg IV every 4 weeks. She is status post31cycles.  The patient was seen with Dr. Julien Nordmann today.Dr. Julien Nordmann gave the patient the option of continuing on treatment vs. Continuing on observation while she manages her breathing/COPD vs. Receiving treatment today and then continuing on observation with repeat imaging in 3 months. She opted to receive cycle #32 today as scheduled and then take a break for 3 months while she manages her breathing concerns.  I will arrange for a restaging CT scan to be performed at that time of the chest, abdomen, and pelvis.   We will see her back for a follow up visit in 3 months for evaluation and to review her scan results.   The patient will continue to follow with her pulmonologist for her management of her COPD.   The patient was advised to call immediately if she has any concerning symptoms in the interval. The patient voices understanding of current disease status and treatment options and is in agreement with the current care plan. All questions were answered. The patient knows to call the clinic with any problems, questions or concerns. We can certainly see the patient much sooner if necessary    Orders Placed This Encounter  Procedures  . CT Chest W Contrast    Standing Status:   Future    Standing Expiration Date:   11/23/2020     Order Specific Question:   If indicated for the ordered procedure, I authorize the administration of contrast media per Radiology protocol    Answer:   Yes    Order Specific Question:   Preferred imaging location?    Answer:   [facility]    Order Specific Question:   Radiology Contrast Protocol - do NOT remove file path    Answer:   [email or web address]  . CT Abdomen Pelvis W Contrast    Standing Status:   Future    Standing Expiration Date:   11/23/2020    Order Specific Question:   If indicated for the ordered procedure, I authorize the administration of contrast media per Radiology protocol    Answer:   Yes    Order Specific Question:   Preferred imaging location?    Answer:   [facility]    Order Specific Question:   Is Oral Contrast requested for this exam?    Answer:   Yes, Per Radiology protocol    Order Specific Question:   Radiology Contrast Protocol - do NOT remove file path    Answer:   [email or web address]     Benisha Hadaway L Jenel Gierke, PA-C 11/24/19  ADDENDUM: Hematology/Oncology Attending: I had a face-to-face encounter with the patient today.  I recommended her care plan.  This is a very pleasant 69 years old African-American female with recurrent non-small cell lung cancer, adenocarcinoma diagnosed in September 2017 initially as stage IIIa status post a course of concurrent chemoradiation followed by consolidation chemotherapy at that time.  The patient has been in observation but imaging studies showed evidence for disease recurrence and she started on treatment with second line immunotherapy with nivolumab 480 mg IV every 4 weeks status post 31 cycles.  The patient has been tolerating her treatment well.  She had several admission to the hospital recently with COPD exacerbation and recurrent pneumonia. The patient is feeling better today and she is here for reevaluation before resuming her treatment. She  had CT scan of the chest performed during her hospitalization that showed no concerning findings for disease progression. I had a lengthy discussion with the patient today about her current condition and treatment options. I gave the patient the option of continuous observation and monitoring after this cycle of the treatment with immunotherapy versus continuous treatment every 4 weeks. The patient had frequent admission recently with recurrent pneumonia and COPD and I felt that she may benefit from taking a break of the treatment. She agreed to the current plan and she will come back for follow-up  visit in 3 months for evaluation with repeat imaging studies. The patient was advised to call immediately if she has any concerning symptoms in the interval.   Disclaimer: This note was dictated with voice recognition software. Similar sounding words can inadvertently be transcribed and may be missed upon review. Eilleen Kempf, MD 11/24/19

## 2019-11-24 ENCOUNTER — Encounter: Payer: Self-pay | Admitting: Physician Assistant

## 2019-11-24 ENCOUNTER — Inpatient Hospital Stay: Payer: Medicare HMO

## 2019-11-24 ENCOUNTER — Inpatient Hospital Stay (HOSPITAL_BASED_OUTPATIENT_CLINIC_OR_DEPARTMENT_OTHER): Payer: Medicare HMO | Admitting: Physician Assistant

## 2019-11-24 ENCOUNTER — Inpatient Hospital Stay: Payer: Medicare HMO | Attending: Oncology

## 2019-11-24 ENCOUNTER — Other Ambulatory Visit: Payer: Self-pay

## 2019-11-24 VITALS — HR 104

## 2019-11-24 VITALS — BP 134/82 | HR 110 | Temp 96.9°F | Resp 18 | Ht 67.0 in | Wt 147.2 lb

## 2019-11-24 DIAGNOSIS — J441 Chronic obstructive pulmonary disease with (acute) exacerbation: Secondary | ICD-10-CM | POA: Diagnosis not present

## 2019-11-24 DIAGNOSIS — Z79899 Other long term (current) drug therapy: Secondary | ICD-10-CM | POA: Diagnosis not present

## 2019-11-24 DIAGNOSIS — C3491 Malignant neoplasm of unspecified part of right bronchus or lung: Secondary | ICD-10-CM

## 2019-11-24 DIAGNOSIS — Z5112 Encounter for antineoplastic immunotherapy: Secondary | ICD-10-CM

## 2019-11-24 DIAGNOSIS — C342 Malignant neoplasm of middle lobe, bronchus or lung: Secondary | ICD-10-CM | POA: Diagnosis present

## 2019-11-24 DIAGNOSIS — I10 Essential (primary) hypertension: Secondary | ICD-10-CM | POA: Diagnosis not present

## 2019-11-24 LAB — CBC WITH DIFFERENTIAL (CANCER CENTER ONLY)
Abs Immature Granulocytes: 0.02 10*3/uL (ref 0.00–0.07)
Basophils Absolute: 0 10*3/uL (ref 0.0–0.1)
Basophils Relative: 0 %
Eosinophils Absolute: 0.1 10*3/uL (ref 0.0–0.5)
Eosinophils Relative: 1 %
HCT: 36.1 % (ref 36.0–46.0)
Hemoglobin: 12.2 g/dL (ref 12.0–15.0)
Immature Granulocytes: 0 %
Lymphocytes Relative: 29 %
Lymphs Abs: 2.1 10*3/uL (ref 0.7–4.0)
MCH: 28.9 pg (ref 26.0–34.0)
MCHC: 33.8 g/dL (ref 30.0–36.0)
MCV: 85.5 fL (ref 80.0–100.0)
Monocytes Absolute: 0.5 10*3/uL (ref 0.1–1.0)
Monocytes Relative: 8 %
Neutro Abs: 4.4 10*3/uL (ref 1.7–7.7)
Neutrophils Relative %: 62 %
Platelet Count: 242 10*3/uL (ref 150–400)
RBC: 4.22 MIL/uL (ref 3.87–5.11)
RDW: 15.1 % (ref 11.5–15.5)
WBC Count: 7.1 10*3/uL (ref 4.0–10.5)
nRBC: 0 % (ref 0.0–0.2)

## 2019-11-24 LAB — CMP (CANCER CENTER ONLY)
ALT: 11 U/L (ref 0–44)
AST: 17 U/L (ref 15–41)
Albumin: 3.2 g/dL — ABNORMAL LOW (ref 3.5–5.0)
Alkaline Phosphatase: 184 U/L — ABNORMAL HIGH (ref 38–126)
Anion gap: 13 (ref 5–15)
BUN: 14 mg/dL (ref 8–23)
CO2: 27 mmol/L (ref 22–32)
Calcium: 9.4 mg/dL (ref 8.9–10.3)
Chloride: 105 mmol/L (ref 98–111)
Creatinine: 0.81 mg/dL (ref 0.44–1.00)
GFR, Est AFR Am: >60 mL/min (ref >60)
GFR, Estimated: >60 mL/min (ref >60)
Glucose, Bld: 85 mg/dL (ref 70–99)
Potassium: 3.4 mmol/L — ABNORMAL LOW (ref 3.5–5.1)
Sodium: 145 mmol/L (ref 135–145)
Total Bilirubin: <0.2 mg/dL — ABNORMAL LOW (ref 0.3–1.2)
Total Protein: 6.7 g/dL (ref 6.5–8.1)

## 2019-11-24 LAB — TSH: TSH: 3.929 u[IU]/mL (ref 0.308–3.960)

## 2019-11-24 MED ORDER — SODIUM CHLORIDE 0.9 % IV SOLN
Freq: Once | INTRAVENOUS | Status: AC
  Filled 2019-11-24: qty 250

## 2019-11-24 MED ORDER — SODIUM CHLORIDE 0.9 % IV SOLN
480.0000 mg | Freq: Once | INTRAVENOUS | Status: AC
  Administered 2019-11-24: 480 mg via INTRAVENOUS
  Filled 2019-11-24: qty 48

## 2019-11-24 NOTE — Progress Notes (Signed)
Per Cassandra Hellingoetter, PA - okay to treat with elevated heart rate.

## 2019-11-24 NOTE — Patient Instructions (Signed)
[facility] Discharge Instructions for Patients Receiving Chemotherapy  Today you received the following chemotherapy agents:  Nivolumab  To help prevent nausea and vomiting after your treatment, we encourage you to take your nausea medication as prescribed.   If you develop nausea and vomiting that is not controlled by your nausea medication, call the clinic.   BELOW ARE SYMPTOMS THAT SHOULD BE REPORTED IMMEDIATELY:  *FEVER GREATER THAN 100.5 F  *CHILLS WITH OR WITHOUT FEVER  NAUSEA AND VOMITING THAT IS NOT CONTROLLED WITH YOUR NAUSEA MEDICATION  *UNUSUAL SHORTNESS OF BREATH  *UNUSUAL BRUISING OR BLEEDING  TENDERNESS IN MOUTH AND THROAT WITH OR WITHOUT PRESENCE OF ULCERS  *URINARY PROBLEMS  *BOWEL PROBLEMS  UNUSUAL RASH Items with * indicate a potential emergency and should be followed up as soon as possible.  Feel free to call the clinic should you have any questions or concerns. The clinic phone number is [phone].  Please show the CHEMO ALERT CARD at check-in to the Emergency Department and triage nurse.   

## 2019-12-08 ENCOUNTER — Ambulatory Visit: Payer: Medicare HMO

## 2019-12-08 ENCOUNTER — Other Ambulatory Visit: Payer: Medicare HMO

## 2019-12-08 ENCOUNTER — Ambulatory Visit: Payer: Medicare HMO | Admitting: Physician Assistant

## 2019-12-15 ENCOUNTER — Other Ambulatory Visit: Payer: Self-pay

## 2019-12-15 ENCOUNTER — Ambulatory Visit (HOSPITAL_COMMUNITY)
Admission: RE | Admit: 2019-12-15 | Discharge: 2019-12-15 | Disposition: A | Discharge status: Home / Self Care | Payer: Medicare HMO | Source: Ambulatory Visit | Attending: Internal Medicine | Admitting: Internal Medicine

## 2019-12-15 DIAGNOSIS — Z1231 Encounter for screening mammogram for malignant neoplasm of breast: Secondary | ICD-10-CM | POA: Diagnosis not present

## 2020-01-24 ENCOUNTER — Ambulatory Visit: Payer: Medicare HMO | Attending: Internal Medicine

## 2020-01-24 DIAGNOSIS — G4730 Sleep apnea, unspecified: Secondary | ICD-10-CM | POA: Diagnosis present

## 2020-01-24 DIAGNOSIS — G4733 Obstructive sleep apnea (adult) (pediatric): Secondary | ICD-10-CM | POA: Insufficient documentation

## 2020-01-25 ENCOUNTER — Other Ambulatory Visit: Payer: Self-pay

## 2020-01-30 ENCOUNTER — Other Ambulatory Visit: Payer: Self-pay | Admitting: Medical Oncology

## 2020-01-30 ENCOUNTER — Telehealth: Payer: Self-pay | Admitting: Medical Oncology

## 2020-01-30 DIAGNOSIS — Z5112 Encounter for antineoplastic immunotherapy: Secondary | ICD-10-CM

## 2020-01-30 NOTE — Telephone Encounter (Signed)
Labs scheduled for CT scan.pt notified.

## 2020-02-23 ENCOUNTER — Ambulatory Visit (HOSPITAL_COMMUNITY): Payer: Medicare HMO

## 2020-02-23 ENCOUNTER — Other Ambulatory Visit: Payer: Medicare HMO

## 2020-02-24 ENCOUNTER — Encounter (HOSPITAL_COMMUNITY): Payer: Self-pay

## 2020-02-24 ENCOUNTER — Inpatient Hospital Stay: Payer: Medicare HMO | Attending: Oncology

## 2020-02-24 ENCOUNTER — Ambulatory Visit (HOSPITAL_COMMUNITY)
Admission: RE | Admit: 2020-02-24 | Discharge: 2020-02-24 | Disposition: A | Discharge status: Home / Self Care | Payer: Medicare HMO | Source: Ambulatory Visit | Attending: Physician Assistant | Admitting: Physician Assistant

## 2020-02-24 ENCOUNTER — Other Ambulatory Visit: Payer: Self-pay

## 2020-02-24 DIAGNOSIS — Z79899 Other long term (current) drug therapy: Secondary | ICD-10-CM | POA: Insufficient documentation

## 2020-02-24 DIAGNOSIS — C3491 Malignant neoplasm of unspecified part of right bronchus or lung: Secondary | ICD-10-CM | POA: Diagnosis present

## 2020-02-24 DIAGNOSIS — C342 Malignant neoplasm of middle lobe, bronchus or lung: Secondary | ICD-10-CM | POA: Insufficient documentation

## 2020-02-24 DIAGNOSIS — J449 Chronic obstructive pulmonary disease, unspecified: Secondary | ICD-10-CM | POA: Diagnosis not present

## 2020-02-24 DIAGNOSIS — Z5112 Encounter for antineoplastic immunotherapy: Secondary | ICD-10-CM

## 2020-02-24 DIAGNOSIS — F17210 Nicotine dependence, cigarettes, uncomplicated: Secondary | ICD-10-CM | POA: Diagnosis not present

## 2020-02-24 LAB — CBC WITH DIFFERENTIAL (CANCER CENTER ONLY)
Abs Immature Granulocytes: 0.02 10*3/uL (ref 0.00–0.07)
Basophils Absolute: 0 10*3/uL (ref 0.0–0.1)
Basophils Relative: 1 %
Eosinophils Absolute: 0.1 10*3/uL (ref 0.0–0.5)
Eosinophils Relative: 1 %
HCT: 43.3 % (ref 36.0–46.0)
Hemoglobin: 15.1 g/dL — ABNORMAL HIGH (ref 12.0–15.0)
Immature Granulocytes: 0 %
Lymphocytes Relative: 33 %
Lymphs Abs: 1.6 10*3/uL (ref 0.7–4.0)
MCH: 28.4 pg (ref 26.0–34.0)
MCHC: 34.9 g/dL (ref 30.0–36.0)
MCV: 81.4 fL (ref 80.0–100.0)
Monocytes Absolute: 0.4 10*3/uL (ref 0.1–1.0)
Monocytes Relative: 8 %
Neutro Abs: 2.8 10*3/uL (ref 1.7–7.7)
Neutrophils Relative %: 57 %
Platelet Count: 230 10*3/uL (ref 150–400)
RBC: 5.32 MIL/uL — ABNORMAL HIGH (ref 3.87–5.11)
RDW: 15 % (ref 11.5–15.5)
WBC Count: 4.9 10*3/uL (ref 4.0–10.5)
nRBC: 0 % (ref 0.0–0.2)

## 2020-02-24 LAB — CMP (CANCER CENTER ONLY)
ALT: 7 U/L (ref 0–44)
AST: 16 U/L (ref 15–41)
Albumin: 3.8 g/dL (ref 3.5–5.0)
Alkaline Phosphatase: 263 U/L — ABNORMAL HIGH (ref 38–126)
Anion gap: 11 (ref 5–15)
BUN: 15 mg/dL (ref 8–23)
CO2: 23 mmol/L (ref 22–32)
Calcium: 9.1 mg/dL (ref 8.9–10.3)
Chloride: 109 mmol/L (ref 98–111)
Creatinine: 0.78 mg/dL (ref 0.44–1.00)
GFR, Estimated: >60 mL/min (ref >60)
Glucose, Bld: 85 mg/dL (ref 70–99)
Potassium: 4.2 mmol/L (ref 3.5–5.1)
Sodium: 143 mmol/L (ref 135–145)
Total Bilirubin: 0.3 mg/dL (ref 0.3–1.2)
Total Protein: 7.1 g/dL (ref 6.5–8.1)

## 2020-02-24 LAB — TSH: TSH: 2.766 u[IU]/mL (ref 0.308–3.960)

## 2020-02-24 MED ORDER — IOHEXOL 300 MG/ML  SOLN
100.0000 mL | Freq: Once | INTRAMUSCULAR | Status: AC | PRN
  Administered 2020-02-24: 100 mL via INTRAVENOUS

## 2020-03-01 ENCOUNTER — Telehealth: Payer: Self-pay | Admitting: Physician Assistant

## 2020-03-01 NOTE — Telephone Encounter (Signed)
Scheduled appt per 11/18 sch msg - pt is aware of appt date and time

## 2020-03-02 NOTE — Progress Notes (Signed)
[facility] OFFICE PROGRESS NOTE  Barry Dienes, NP No address on file  DIAGNOSIS: Recurrent non-small cell lung cancer initially diagnosed as stage IIIA (T1b, N2, M0) non-small cell lung cancer presented with right middle lobe pulmonary nodule, mediastinal lymphadenopathy and highly suspicious for small nodule in the left upper lobe that could change her stage to stage IV that could present another synchronous primary lesion in the left upper lobe. This was diagnosed in September 2017.  PRIOR THERAPY: 1) Concurrent chemoradiation with weekly carboplatin for AUC of 2 and paclitaxel 45 MG/M2 status post 6 cycles last dose was given 02/25/2016 with partial response. 2) Consolidation chemotherapy with carboplatin for AUC of 5 and paclitaxel 175 MG/M2 every 3 weeks with Neulasta support. First dose 04/22/2016. Status post 3 cycles  CURRENT THERAPY:  Second line treatment with immunotherapy with Nivolumab 480 mg IV every 4 weeks status post31cycles. Her treatment has been on hold since August 2021. Treatment on hold since August 2021.   INTERVAL HISTORY: Courtney Zuniga 69 y.o. female returns to clinic today for a follow-up visit. The patient was last seen in August 2021. At that visit, the patient has been hospitalized multiple times for COPD exacerbations. The patient was given the option of continuing on her current treatment with immunotherapy with nivolumab versus continuing on observation for 3 months with close monitoring until she can get her COPD under control. The patient opted to go on observation for 3 months. Since being off treatment for 3 months, the patient states that her breathing is stable. She has not been to the hospital since that time. She is trying to quit smoking and smokes about 1-2 cigarettes per week. She has also been tested for sleep apnea and is supposed to get a CPAP soon. She is on oxygen via nasal cannula 3 L chronically. She reports her baseline dyspnea on  exertion and cough.   While the patient had been on immunotherapy in the past, she tolerated it well without any adverse side effects. Today, she denies any fever, chills, night sweats, or weight loss. She denies any nausea, vomiting, or diarrhea. She reports her baseline constipation but has not tried using any OTC medications. She denies any headache or visual changes except for her baseline chronic headaches secondary to her history of brain tumor in 2005. The patient recently had a restaging CT scan performed. She is here today for evaluation and to review her scan results.  MEDICAL HISTORY: Past Medical History:  Diagnosis Date  . Anemia    as a young woman  . Arthritis    osteoartritis  . Asthma   . Brain tumor (benign) ([place]) 2005 [facility]   Benign  . Chronic headaches   . Chronic hip pain   . Chronic pain   . COPD (chronic obstructive pulmonary disease) ([place])   . Coronary artery disease   . Depression   . Depression 05/15/2016  . Encounter for antineoplastic chemotherapy 01/10/2016  . GERD (gastroesophageal reflux disease)   . Hypertension   . Lung cancer ([place]) dx'd 01/2016   currently on chemo and radiation   . NSTEMI (non-ST elevated myocardial infarction) ([place]) yrs ago  . On home O2    qhs 2 liters at hs and prn  . Pneumonia last time 2 yrs ago  . Shortness of breath dyspnea    with activity    ALLERGIES:  has No Known Allergies.  MEDICATIONS:  Current Outpatient Medications  Medication Sig Dispense Refill  . acetaminophen (  TYLENOL) 325 MG tablet Take 2 tablets (650 mg total) by mouth every 6 (six) hours as needed for mild pain, fever or headache. 12 tablet 2  . albuterol (PROVENTIL HFA;VENTOLIN HFA) 108 (90 BASE) MCG/ACT inhaler Inhale 2 puffs into the lungs every 4 (four) hours as needed for shortness of breath.     Marland Kitchen azithromycin (ZITHROMAX) 250 MG tablet Take 1 tablet (250 mg total) by mouth daily. 4 each 0  . BREO ELLIPTA 100-25 MCG/INH AEPB Inhale 1 puff into the  lungs daily.     . Cholecalciferol (VITAMIN D3) 125 MCG (5000 UT) CAPS Take 1 capsule by mouth daily.  0  . cyclobenzaprine (FLEXERIL) 10 MG tablet Take 1 tablet (10 mg total) by mouth 2 (two) times daily. *MAy take one additional tablet as needed for muscle spasms 60 tablet 2  . dextromethorphan-guaiFENesin (MUCINEX DM) 30-600 MG 12hr tablet Take 1 tablet by mouth 2 (two) times daily. 40 tablet 0  . diclofenac sodium (VOLTAREN) 1 % GEL Apply topically 4 (four) times daily as needed (massge gel into affected area(s) as needed for pain).   1  . doxycycline (VIBRAMYCIN) 100 MG capsule Take 1 capsule by mouth daily.    . DULoxetine (CYMBALTA) 30 MG capsule duloxetine 30 mg capsule,delayed release    . ENSURE (ENSURE) Take 237 mLs by mouth 3 (three) times daily between meals.    . Ipratropium-Albuterol (COMBIVENT) 20-100 MCG/ACT AERS respimat Inhale 2 puffs into the lungs 4 (four) times daily as needed.    Marland Kitchen ipratropium-albuterol (DUONEB) 0.5-2.5 (3) MG/3ML SOLN Take 3 mLs by nebulization 3 (three) times daily. 360 mL 2  . lidocaine (XYLOCAINE) 2 % solution     . methocarbamol (ROBAXIN) 500 MG tablet Take 1 tablet (500 mg total) by mouth every 8 (eight) hours as needed for muscle spasms. 30 tablet 0  . nicotine (NICODERM CQ - DOSED IN MG/24 HOURS) 14 mg/24hr patch Place 1 patch (14 mg total) onto the skin daily. 28 patch 0  . oxyCODONE-acetaminophen (PERCOCET/ROXICET) 5-325 MG tablet Take 1 tablet by mouth every 8 (eight) hours as needed for moderate pain. 12 tablet 0  . pantoprazole (PROTONIX) 40 MG tablet Take 1 tablet (40 mg total) by mouth daily. 30 tablet 3  . predniSONE (DELTASONE) 20 MG tablet Take 1 tablet by mouth daily.    . ROBITUSSIN 12 HOUR COUGH 30 MG/5ML liquid Take 30 mg by mouth every 4 (four) hours as needed for cough.     . topiramate (TOPAMAX) 25 MG tablet Take 1 tablet by mouth daily at bedtime X 1 week; then increase to 25mg  BID. 45 tablet 2  . metoprolol tartrate (LOPRESSOR) 25  MG tablet Take 1 tablet (25 mg total) by mouth 2 (two) times daily. 60 tablet 1   No current facility-administered medications for this visit.    SURGICAL HISTORY:  Past Surgical History:  Procedure Laterality Date  . CHOLECYSTECTOMY    . COLONOSCOPY  2015   Results requested from [facility]  . COLONOSCOPY    . ESOPHAGOGASTRODUODENOSCOPY N/A 08/14/2015   Procedure: ESOPHAGOGASTRODUODENOSCOPY (EGD);  Surgeon: Daneil Dolin, MD;  Location[facility];  Service: Endoscopy;  Laterality: N/A;  215   . ESOPHAGOGASTRODUODENOSCOPY (EGD) WITH PROPOFOL N/A 09/13/2015   Procedure: ESOPHAGOGASTRODUODENOSCOPY (EGD) WITH PROPOFOL;  Surgeon: Milus Banister, MD;  Location: [facility];  Service: Endoscopy;  Laterality: N/A;  . EUS N/A 03/12/2017   Procedure: UPPER ENDOSCOPIC ULTRASOUND (EUS) RADIAL;  Surgeon: Owens Loffler  P, MD;  Location: [facility];  Service: Endoscopy;  Laterality: N/A;  . TUMOR REMOVAL  2005   Benign  . UPPER ESOPHAGEAL ENDOSCOPIC ULTRASOUND (EUS)  09/13/2015   Procedure: UPPER ESOPHAGEAL ENDOSCOPIC ULTRASOUND (EUS);  Surgeon: Milus Banister, MD;  Location: [facility];  Service: Endoscopy;;  . VIDEO BRONCHOSCOPY WITH ENDOBRONCHIAL NAVIGATION N/A 12/31/2015   Procedure: VIDEO BRONCHOSCOPY WITH ENDOBRONCHIAL NAVIGATION;  Surgeon: Melrose Nakayama, MD;  Location: [facility];  Service: Thoracic;  Laterality: N/A;  . VIDEO BRONCHOSCOPY WITH ENDOBRONCHIAL ULTRASOUND N/A 11/08/2015   Procedure: VIDEO BRONCHOSCOPY WITH ENDOBRONCHIAL ULTRASOUND;  Surgeon: Ivin Poot, MD;  Location: [facility];  Service: Thoracic;  Laterality: N/A;    REVIEW OF SYSTEMS:   Review of Systems  Constitutional: Negative for appetite change, chills, fatigue, fever and unexpected weight change.  HENT: Negative for mouth sores, nosebleeds, sore throat and trouble swallowing.  Eyes: Negative for eye problems and icterus.  Respiratory:Positive for shortness of breath with exertionand chronic  cough.Negative for hemoptysis. Cardiovascular: Negative for chest pain and leg swelling.  Gastrointestinal: Positive for constipation. Negative for abdominal pain, diarrhea, nausea and vomiting.  Genitourinary: Negative for bladder incontinence, difficulty urinating, dysuria, frequency and hematuria.  Musculoskeletal: Negative for back pain, gait problem, neck pain and neck stiffness.  Skin: Negative for itching and rash.  Neurological:Positive for chronic headaches.Negative for dizziness, extremity weakness, gait problem, light-headedness and seizures.  Hematological: Negative for adenopathy. Does not bruise/bleed easily.  Psychiatric/Behavioral: Negative for confusion, depression and sleep disturbance. The patient is not nervous/anxious.    PHYSICAL EXAMINATION:  Blood pressure 113/70, pulse 60, temperature 98 F (36.7 C), temperature source Tympanic, resp. rate 20, height 5\' 7"  (1.702 m), weight 141 lb 12.8 oz (64.3 kg), SpO2 100 %.  ECOG PERFORMANCE STATUS: 1 - Symptomatic but completely ambulatory  Physical Exam  Constitutional: Oriented to person, place, and time and well-developed, well-nourished, and in no distress.  HENT:  Head: Normocephalic and atraumatic.  Mouth/Throat: Oropharynx is clear and moist. No oropharyngeal exudate.  Eyes: Conjunctivae are normal. Right eye exhibits no discharge. Left eye exhibits no discharge. No scleral icterus.  Neck: Normal range of motion. Neck supple.  Cardiovascular: normal rate, regular rhythm, normal heart sounds and intact distal pulses.  Pulmonary/Chest: Effort normal and breath soundsquiet in all lung fieldswith some rhonchi. No respiratory distress. No rales.  Abdominal: Soft. Bowel sounds are normal. Exhibits no distension and no mass. There is no tenderness.  Musculoskeletal: Normal range of motion. Exhibits no edema.  Lymphadenopathy:  No cervical adenopathy.  Neurological: Left facial twitching and weakness (baseline  from brain tumor in 2005).Alert and oriented to person, place, and time. Exhibits normal muscle tone. Gait normal. Coordination normal.  Skin: Skin is warm and dry. No rash noted. Not diaphoretic. No erythema. No pallor.  Psychiatric: Mood, memory and judgment normal.  Vitals reviewed.  LABORATORY DATA: Lab Results  Component Value Date   WBC 4.9 02/24/2020   HGB 15.1 (H) 02/24/2020   HCT 43.3 02/24/2020   MCV 81.4 02/24/2020   PLT 230 02/24/2020      Chemistry      Component Value Date/Time   NA 143 02/24/2020 0855   NA 144 04/09/2017 0948   K 4.2 02/24/2020 0855   K 4.3 04/09/2017 0948   CL 109 02/24/2020 0855   CO2 23 02/24/2020 0855   CO2 25 04/09/2017 0948   BUN 15 02/24/2020 0855   BUN 16.8 04/09/2017 0948   CREATININE 0.78 02/24/2020 0855  CREATININE 0.8 04/09/2017 0948      Component Value Date/Time   CALCIUM 9.1 02/24/2020 0855   CALCIUM 9.1 04/09/2017 0948   ALKPHOS 263 (H) 02/24/2020 0855   ALKPHOS 216 (H) 04/09/2017 0948   AST 16 02/24/2020 0855   AST 12 04/09/2017 0948   ALT 7 02/24/2020 0855   ALT 13 04/09/2017 0948   BILITOT 0.3 02/24/2020 0855   BILITOT 0.22 04/09/2017 0948       RADIOGRAPHIC STUDIES:  CT Chest W Contrast  Result Date: 02/24/2020 CLINICAL DATA:  Primary Cancer Type: Lung Imaging Indication: Routine surveillance Interval therapy since last imaging? Yes Initial Cancer Diagnosis Date: 12/31/2015; Established by: Biopsy-proven Detailed Pathology: Recurrent non-small cell lung cancer initially diagnosed as stage IIIA non-small cell lung cancer. Primary Tumor location: Right middle lobe.  Right lower lobe nodule. Recurrence? Yes; Date(s) of recurrence: 2019; Established by: Imaging only Surgeries: Cholecystectomy. Chemotherapy: Yes; Ongoing?  No; Most recent administration: 2018 Immunotherapy?  Yes; Type: Nivolumab; Ongoing? Yes Radiation therapy? Yes; Date Range: 01/22/2016 - 03/04/2016; Target: Right lung Other Cancers: Brain tumor  2005. EXAM: CT CHEST, ABDOMEN, AND PELVIS WITH CONTRAST TECHNIQUE: Multidetector CT imaging of the chest, abdomen and pelvis was performed following the standard protocol during bolus administration of intravenous contrast. CONTRAST:  151mL OMNIPAQUE IOHEXOL 300 MG/ML  SOLN COMPARISON:  Most recent CT chest 11/05/2019. CT chest, abdomen and pelvis 10/11/2019. FINDINGS: CT CHEST FINDINGS Cardiovascular: The heart size appears normal. Aortic atherosclerosis. Coronary artery calcifications. No pericardial effusion identified. Increase caliber of the main pulmonary artery is noted which may reflect underlying PA hypertension. Mediastinum/Nodes: Normal appearance of the thyroid gland. The trachea appears patent and is midline. Normal appearance of the esophagus. No supraclavicular, axillary, mediastinal, or hilar adenopathy. Lungs/Pleura: No pleural effusion. Advanced changes of centrilobular and paraseptal emphysema. Unchanged appearance complete atelectasis of the right middle lobe, image 68/6. Subsegmental atelectasis/scar within the lingula is unchanged. Within the medial right upper lobe there is a focal area of scarring and architectural distortion, image 44/4. Stable from previous exam. Subpleural fibrosis and pleural thickening overlying the anterior basal right upper lobe is noted and appears unchanged, image 68/6 and image 108/4. Musculoskeletal: The bones appear diffusely osteopenic. No aggressive lytic or sclerotic bone lesions identified. CT ABDOMEN PELVIS FINDINGS Hepatobiliary: No suspicious liver abnormality. Previous cholecystectomy with chronic biliary dilatation which likely reflects post cholecystectomy physiology. Pancreas: Unremarkable. No pancreatic ductal dilatation or surrounding inflammatory changes. Spleen: Normal in size without focal abnormality. Adrenals/Urinary Tract: Normal appearance of the adrenal glands. Anterior cortex left kidney cyst measures 1.2 cm. Several subcentimeter kidney  lesions are noted within the right kidney measuring up to 7 mm. These are too small to reliably characterize. No hydronephrosis identified bilaterally. Punctate stone identified within the inferior pole of left kidney. The urinary bladder is unremarkable. Stomach/Bowel: Stomach is normal. The appendix is visualized and appears normal. No bowel wall thickening, inflammation or distension. Vascular/Lymphatic: Aortic atherosclerosis without aneurysm. No abdominopelvic adenopathy Reproductive: Multiple calcified uterine fibroids are identified. No adnexal mass noted. Other: No free fluid or fluid collections. Musculoskeletal: The bones appear osteopenic. No acute or suspicious osseous findings. IMPRESSION: 1. Stable exam. No specific findings to suggest recurrent or metastatic disease within the chest, abdomen or pelvis. 2. Unchanged complete atelectasis of the right middle lobe and subsegmental atelectasis of the lingula. 3. Coronary artery calcifications 4. Aortic Atherosclerosis (ICD10-I70.0) and Emphysema (ICD10-J43.9). Electronically Signed   By: Kerby Moors M.D.   On: 02/24/2020 12:11   CT  Abdomen Pelvis W Contrast  Result Date: 02/24/2020 CLINICAL DATA:  Primary Cancer Type: Lung Imaging Indication: Routine surveillance Interval therapy since last imaging? Yes Initial Cancer Diagnosis Date: 12/31/2015; Established by: Biopsy-proven Detailed Pathology: Recurrent non-small cell lung cancer initially diagnosed as stage IIIA non-small cell lung cancer. Primary Tumor location: Right middle lobe.  Right lower lobe nodule. Recurrence? Yes; Date(s) of recurrence: 2019; Established by: Imaging only Surgeries: Cholecystectomy. Chemotherapy: Yes; Ongoing?  No; Most recent administration: 2018 Immunotherapy?  Yes; Type: Nivolumab; Ongoing? Yes Radiation therapy? Yes; Date Range: 01/22/2016 - 03/04/2016; Target: Right lung Other Cancers: Brain tumor 2005. EXAM: CT CHEST, ABDOMEN, AND PELVIS WITH CONTRAST TECHNIQUE:  Multidetector CT imaging of the chest, abdomen and pelvis was performed following the standard protocol during bolus administration of intravenous contrast. CONTRAST:  115mL OMNIPAQUE IOHEXOL 300 MG/ML  SOLN COMPARISON:  Most recent CT chest 11/05/2019. CT chest, abdomen and pelvis 10/11/2019. FINDINGS: CT CHEST FINDINGS Cardiovascular: The heart size appears normal. Aortic atherosclerosis. Coronary artery calcifications. No pericardial effusion identified. Increase caliber of the main pulmonary artery is noted which may reflect underlying PA hypertension. Mediastinum/Nodes: Normal appearance of the thyroid gland. The trachea appears patent and is midline. Normal appearance of the esophagus. No supraclavicular, axillary, mediastinal, or hilar adenopathy. Lungs/Pleura: No pleural effusion. Advanced changes of centrilobular and paraseptal emphysema. Unchanged appearance complete atelectasis of the right middle lobe, image 68/6. Subsegmental atelectasis/scar within the lingula is unchanged. Within the medial right upper lobe there is a focal area of scarring and architectural distortion, image 44/4. Stable from previous exam. Subpleural fibrosis and pleural thickening overlying the anterior basal right upper lobe is noted and appears unchanged, image 68/6 and image 108/4. Musculoskeletal: The bones appear diffusely osteopenic. No aggressive lytic or sclerotic bone lesions identified. CT ABDOMEN PELVIS FINDINGS Hepatobiliary: No suspicious liver abnormality. Previous cholecystectomy with chronic biliary dilatation which likely reflects post cholecystectomy physiology. Pancreas: Unremarkable. No pancreatic ductal dilatation or surrounding inflammatory changes. Spleen: Normal in size without focal abnormality. Adrenals/Urinary Tract: Normal appearance of the adrenal glands. Anterior cortex left kidney cyst measures 1.2 cm. Several subcentimeter kidney lesions are noted within the right kidney measuring up to 7 mm. These  are too small to reliably characterize. No hydronephrosis identified bilaterally. Punctate stone identified within the inferior pole of left kidney. The urinary bladder is unremarkable. Stomach/Bowel: Stomach is normal. The appendix is visualized and appears normal. No bowel wall thickening, inflammation or distension. Vascular/Lymphatic: Aortic atherosclerosis without aneurysm. No abdominopelvic adenopathy Reproductive: Multiple calcified uterine fibroids are identified. No adnexal mass noted. Other: No free fluid or fluid collections. Musculoskeletal: The bones appear osteopenic. No acute or suspicious osseous findings. IMPRESSION: 1. Stable exam. No specific findings to suggest recurrent or metastatic disease within the chest, abdomen or pelvis. 2. Unchanged complete atelectasis of the right middle lobe and subsegmental atelectasis of the lingula. 3. Coronary artery calcifications 4. Aortic Atherosclerosis (ICD10-I70.0) and Emphysema (ICD10-J43.9). Electronically Signed   By: Kerby Moors M.D.   On: 02/24/2020 12:11   SLEEP STUDY DOCUMENTS  Result Date: 02/08/2020 Ordered by an unspecified provider.    ASSESSMENT/PLAN:  This is a very pleasant78 year old African-American female with recurrent metastatic non-small cell lung cancer, adenocarcinoma of the right middle lobe. She was initially diagnosed as stage IIIa in September 2017.   She completed concurrent chemoradiation followed by consolidation with carboplatin and paclitaxel. She is status post3 cycles. The patient was on observation but a restagingCTscan showed evidence of disease progression.  Sheis currently undergoing treatment withsecond  line with immunotherapy with nivolumab 480 mg IV every 4 weeks. She is status post31cycles.Her treatment has been on hold since August 2021 until her COPD can get under control.   The patient recently had a restaging CT scan performed. Dr. Julien Nordmann personally and independently reviewed the  scan and discussed for follow-up with the patient today. The scan did not show any evidence of disease progression. Dr. Julien Nordmann recommends that she continue on observation with a repeat scan in 3 months.   I will arrange for a repeat CT scan of the chest, abdomen, and pelvis in 3 months  We will see her back for follow-up visit in 3 months for evaluation and to review her scan.   She was strongly encouraged to quit smoking. She was encouraged to try using nicotine patches and/or gum.   Constipation education was provided to the patient today. Encouraged to use a stool softener to keep her regular as well as exercise as tolerated, drinking plenty of fluids, and fruits and veggies. If she has not had a bowel movement in several days despite this, then she was encouraged to use a laxative.    The patient was advised to call immediately if she has any concerning symptoms in the interval. The patient voices understanding of current disease status and treatment options and is in agreement with the current care plan. All questions were answered. The patient knows to call the clinic with any problems, questions or concerns. We can certainly see the patient much sooner if necessary      Orders Placed This Encounter  Procedures  . CT Chest W Contrast    Standing Status:   Future    Standing Expiration Date:   03/06/2021    Order Specific Question:   If indicated for the ordered procedure, I authorize the administration of contrast media per Radiology protocol    Answer:   Yes    Order Specific Question:   Preferred imaging location?    Answer:   [facility]  . CT Abdomen Pelvis W Contrast    Standing Status:   Future    Standing Expiration Date:   03/06/2021    Order Specific Question:   If indicated for the ordered procedure, I authorize the administration of contrast media per Radiology protocol    Answer:   Yes    Order Specific Question:   Preferred imaging location?    Answer:    [facility]    Order Specific Question:   Is Oral Contrast requested for this exam?    Answer:   Yes, Per Radiology protocol     Lygia Olaes L Emmakate Hypes, PA-C 03/06/20  ADDENDUM: Hematology/Oncology Attending: I had a face-to-face encounter with the patient today.  I recommended her care plan.  This is a very pleasant 69 years old African-American female with recurrent non-small cell lung cancer and she was treated with second line immunotherapy with nivolumab 480 mg IV every 4 weeks status post 31 cycles.  Her treatment has been on hold since August 2021 secondary to COPD exacerbation and frequent visit to the emergency department and requiring treatment with steroids. The patient is feeling fine today with no concerning complaints and she had no recent visit to the emergency department for COPD exacerbation. She had repeat CT scan of the chest performed recently.  I personally and independently reviewed the scans and discussed the results with the patient today. Her scan showed no concerning findings for disease progression. I recommended for her  to continue on observation with repeat CT scan of the chest, abdomen and pelvis in 3 months. She was advised to call immediately if she has any concerning symptoms in the interval.  Disclaimer: This note was dictated with voice recognition software. Similar sounding words can inadvertently be transcribed and may be missed upon review. Eilleen Kempf, MD 03/06/20

## 2020-03-06 ENCOUNTER — Encounter: Payer: Self-pay | Admitting: Physician Assistant

## 2020-03-06 ENCOUNTER — Other Ambulatory Visit: Payer: Self-pay

## 2020-03-06 ENCOUNTER — Inpatient Hospital Stay (HOSPITAL_BASED_OUTPATIENT_CLINIC_OR_DEPARTMENT_OTHER): Payer: Medicare HMO | Admitting: Physician Assistant

## 2020-03-06 VITALS — BP 113/70 | HR 60 | Temp 98.0°F | Resp 20 | Ht 67.0 in | Wt 141.8 lb

## 2020-03-06 DIAGNOSIS — C3491 Malignant neoplasm of unspecified part of right bronchus or lung: Secondary | ICD-10-CM

## 2020-03-06 DIAGNOSIS — C342 Malignant neoplasm of middle lobe, bronchus or lung: Secondary | ICD-10-CM | POA: Diagnosis not present

## 2020-03-09 ENCOUNTER — Telehealth: Payer: Self-pay | Admitting: Physician Assistant

## 2020-03-09 NOTE — Telephone Encounter (Signed)
Scheduled per los. Called and spoke with patient. Confirmed appt 

## 2020-05-23 ENCOUNTER — Telehealth: Payer: Self-pay

## 2020-05-23 NOTE — Telephone Encounter (Signed)
Returned call to pt regarding oral contrast for CT scan. Told pt it is ok to get it from [facility]. Pt has no way to get it from [facility] or [facility] but a family member will get it from [facility].

## 2020-05-29 ENCOUNTER — Telehealth: Payer: Self-pay | Admitting: Medical Oncology

## 2020-05-29 NOTE — Telephone Encounter (Signed)
Confirmed appts for next week.

## 2020-06-04 ENCOUNTER — Other Ambulatory Visit: Payer: Medicare HMO

## 2020-06-05 ENCOUNTER — Inpatient Hospital Stay: Payer: Medicare HMO | Attending: Oncology

## 2020-06-05 ENCOUNTER — Encounter (HOSPITAL_COMMUNITY): Payer: Self-pay

## 2020-06-05 ENCOUNTER — Ambulatory Visit (HOSPITAL_COMMUNITY)
Admission: RE | Admit: 2020-06-05 | Discharge: 2020-06-05 | Disposition: A | Discharge status: Home / Self Care | Payer: Medicare HMO | Source: Ambulatory Visit | Attending: Physician Assistant | Admitting: Physician Assistant

## 2020-06-05 ENCOUNTER — Other Ambulatory Visit: Payer: Self-pay

## 2020-06-05 DIAGNOSIS — R0602 Shortness of breath: Secondary | ICD-10-CM | POA: Diagnosis not present

## 2020-06-05 DIAGNOSIS — R519 Headache, unspecified: Secondary | ICD-10-CM | POA: Insufficient documentation

## 2020-06-05 DIAGNOSIS — Z79899 Other long term (current) drug therapy: Secondary | ICD-10-CM | POA: Diagnosis not present

## 2020-06-05 DIAGNOSIS — Z923 Personal history of irradiation: Secondary | ICD-10-CM | POA: Diagnosis not present

## 2020-06-05 DIAGNOSIS — Z9981 Dependence on supplemental oxygen: Secondary | ICD-10-CM | POA: Diagnosis not present

## 2020-06-05 DIAGNOSIS — C3491 Malignant neoplasm of unspecified part of right bronchus or lung: Secondary | ICD-10-CM | POA: Diagnosis not present

## 2020-06-05 DIAGNOSIS — C342 Malignant neoplasm of middle lobe, bronchus or lung: Secondary | ICD-10-CM | POA: Insufficient documentation

## 2020-06-05 DIAGNOSIS — Z9221 Personal history of antineoplastic chemotherapy: Secondary | ICD-10-CM | POA: Insufficient documentation

## 2020-06-05 LAB — CBC WITH DIFFERENTIAL (CANCER CENTER ONLY)
Abs Immature Granulocytes: 0.04 10*3/uL (ref 0.00–0.07)
Basophils Absolute: 0 10*3/uL (ref 0.0–0.1)
Basophils Relative: 0 %
Eosinophils Absolute: 0.1 10*3/uL (ref 0.0–0.5)
Eosinophils Relative: 1 %
HCT: 39.9 % (ref 36.0–46.0)
Hemoglobin: 13.3 g/dL (ref 12.0–15.0)
Immature Granulocytes: 1 %
Lymphocytes Relative: 11 %
Lymphs Abs: 1 10*3/uL (ref 0.7–4.0)
MCH: 28.5 pg (ref 26.0–34.0)
MCHC: 33.3 g/dL (ref 30.0–36.0)
MCV: 85.6 fL (ref 80.0–100.0)
Monocytes Absolute: 0.3 10*3/uL (ref 0.1–1.0)
Monocytes Relative: 3 %
Neutro Abs: 7.1 10*3/uL (ref 1.7–7.7)
Neutrophils Relative %: 84 %
Platelet Count: 279 10*3/uL (ref 150–400)
RBC: 4.66 MIL/uL (ref 3.87–5.11)
RDW: 15.8 % — ABNORMAL HIGH (ref 11.5–15.5)
WBC Count: 8.5 10*3/uL (ref 4.0–10.5)
nRBC: 0 % (ref 0.0–0.2)

## 2020-06-05 LAB — CMP (CANCER CENTER ONLY)
ALT: 13 U/L (ref 0–44)
AST: 20 U/L (ref 15–41)
Albumin: 3.8 g/dL (ref 3.5–5.0)
Alkaline Phosphatase: 222 U/L — ABNORMAL HIGH (ref 38–126)
Anion gap: 9 (ref 5–15)
BUN: 21 mg/dL (ref 8–23)
CO2: 28 mmol/L (ref 22–32)
Calcium: 9.6 mg/dL (ref 8.9–10.3)
Chloride: 104 mmol/L (ref 98–111)
Creatinine: 0.83 mg/dL (ref 0.44–1.00)
GFR, Estimated: >60 mL/min (ref >60)
Glucose, Bld: 87 mg/dL (ref 70–99)
Potassium: 4.9 mmol/L (ref 3.5–5.1)
Sodium: 141 mmol/L (ref 135–145)
Total Bilirubin: <0.2 mg/dL — ABNORMAL LOW (ref 0.3–1.2)
Total Protein: 6.9 g/dL (ref 6.5–8.1)

## 2020-06-05 LAB — TSH: TSH: 1.02 u[IU]/mL (ref 0.308–3.960)

## 2020-06-05 MED ORDER — IOHEXOL 300 MG/ML  SOLN
100.0000 mL | Freq: Once | INTRAMUSCULAR | Status: AC | PRN
  Administered 2020-06-05: 100 mL via INTRAVENOUS

## 2020-06-07 ENCOUNTER — Other Ambulatory Visit: Payer: Self-pay

## 2020-06-07 ENCOUNTER — Inpatient Hospital Stay (HOSPITAL_BASED_OUTPATIENT_CLINIC_OR_DEPARTMENT_OTHER): Payer: Medicare HMO | Admitting: Internal Medicine

## 2020-06-07 ENCOUNTER — Telehealth: Payer: Self-pay | Admitting: Internal Medicine

## 2020-06-07 ENCOUNTER — Encounter: Payer: Self-pay | Admitting: Internal Medicine

## 2020-06-07 ENCOUNTER — Ambulatory Visit: Payer: Medicare HMO | Admitting: Physician Assistant

## 2020-06-07 VITALS — BP 102/71 | HR 94 | Temp 97.8°F | Resp 17 | Ht 67.0 in | Wt 141.7 lb

## 2020-06-07 DIAGNOSIS — Z923 Personal history of irradiation: Secondary | ICD-10-CM

## 2020-06-07 DIAGNOSIS — Z9221 Personal history of antineoplastic chemotherapy: Secondary | ICD-10-CM | POA: Diagnosis not present

## 2020-06-07 DIAGNOSIS — C3491 Malignant neoplasm of unspecified part of right bronchus or lung: Secondary | ICD-10-CM

## 2020-06-07 DIAGNOSIS — C3490 Malignant neoplasm of unspecified part of unspecified bronchus or lung: Secondary | ICD-10-CM | POA: Diagnosis not present

## 2020-06-07 DIAGNOSIS — C342 Malignant neoplasm of middle lobe, bronchus or lung: Secondary | ICD-10-CM

## 2020-06-07 NOTE — Telephone Encounter (Signed)
Scheduled appointments per 2/24 los. Spoke to patient who is aware of appointment date and time.

## 2020-06-07 NOTE — Progress Notes (Signed)
[place] Telephone:([phone]) [phone]   Fax:([phone]) [phone]  OFFICE PROGRESS NOTE  Barry Dienes, NP No address on file  DIAGNOSIS: Recurrent non-small cell lung cancer initially diagnosed as stage IIIA (T1b, N2, M0) non-small cell lung cancer presented with right middle lobe pulmonary nodule, mediastinal lymphadenopathy and highly suspicious for small nodule in the left upper lobe that could change her stage to stage IV that could present another synchronous primary lesion in the left upper lobe. This was diagnosed in September 2017.  PRIOR THERAPY:  1) Concurrent chemoradiation with weekly carboplatin for AUC of 2 and paclitaxel 45 MG/M2 status post 6 cycles last dose was given 02/25/2016 with partial response. 2) Consolidation chemotherapy with carboplatin for AUC of 5 and paclitaxel 175 MG/M2 every 3 weeks with Neulasta support. First dose 04/22/2016. Status post 3 cycles. 3) Second line treatment with immunotherapy with Nivolumab 480 mg IV every 4 weeks status post 32 cycles.  Discontinued secondary to intolerance and frequent hospitalization with pneumonia and pneumonitis.  CURRENT THERAPY: Observation.  INTERVAL HISTORY: Courtney Zuniga 70 y.o. female returns to the clinic today for follow-up visit.  The patient is feeling fine except for the baseline shortness of breath and she is currently on home oxygen.  She also has intermittent headache.  She denied having any chest pain, cough or hemoptysis.  She denied having any fever or chills.  She has no nausea, vomiting, diarrhea or constipation.  She has no weight loss or night sweats.  She has been in observation since August 2021 and she has been doing well except for the baseline shortness of breath.  The patient had repeat CT scan of the chest performed recently and she is here for evaluation and discussion of her scan results.    MEDICAL HISTORY: Past Medical History:  Diagnosis Date  . Anemia    as a young woman   . Arthritis    osteoartritis  . Asthma   . Brain tumor (benign) ([place]) 2005 [facility]   Benign  . Chronic headaches   . Chronic hip pain   . Chronic pain   . COPD (chronic obstructive pulmonary disease) ([place])   . Coronary artery disease   . Depression   . Depression 05/15/2016  . Encounter for antineoplastic chemotherapy 01/10/2016  . GERD (gastroesophageal reflux disease)   . Hypertension   . Lung cancer ([place]) dx'd 01/2016   currently on chemo and radiation   . NSTEMI (non-ST elevated myocardial infarction) ([place]) yrs ago  . On home O2    qhs 2 liters at hs and prn  . Pneumonia last time 2 yrs ago  . Shortness of breath dyspnea    with activity    ALLERGIES:  has No Known Allergies.  MEDICATIONS:  Current Outpatient Medications  Medication Sig Dispense Refill  . acetaminophen (TYLENOL) 325 MG tablet Take 2 tablets (650 mg total) by mouth every 6 (six) hours as needed for mild pain, fever or headache. 12 tablet 2  . albuterol (PROVENTIL HFA;VENTOLIN HFA) 108 (90 BASE) MCG/ACT inhaler Inhale 2 puffs into the lungs every 4 (four) hours as needed for shortness of breath.     Marland Kitchen atorvastatin (LIPITOR) 10 MG tablet atorvastatin 10 mg tablet    . azithromycin (ZITHROMAX) 250 MG tablet Take 1 tablet (250 mg total) by mouth daily. 4 each 0  . BREO ELLIPTA 100-25 MCG/INH AEPB Inhale 1 puff into the lungs daily.     . Cholecalciferol (VITAMIN D3) 125  MCG (5000 UT) CAPS Take 1 capsule by mouth daily.  0  . cyclobenzaprine (FLEXERIL) 10 MG tablet Take 1 tablet (10 mg total) by mouth 2 (two) times daily. *MAy take one additional tablet as needed for muscle spasms 60 tablet 2  . dextromethorphan-guaiFENesin (MUCINEX DM) 30-600 MG 12hr tablet Take 1 tablet by mouth 2 (two) times daily. 40 tablet 0  . diclofenac sodium (VOLTAREN) 1 % GEL Apply topically 4 (four) times daily as needed (massge gel into affected area(s) as needed for pain).   1  . doxycycline (VIBRAMYCIN) 100 MG capsule Take 1 capsule  by mouth daily.    . DULoxetine (CYMBALTA) 30 MG capsule duloxetine 30 mg capsule,delayed release    . Dupilumab (DUPIXENT) 300 MG/2ML SOPN Dupixent 300 mg/2 mL subcutaneous pen injector    . ENSURE (ENSURE) Take 237 mLs by mouth 3 (three) times daily between meals.    Marland Kitchen EPINEPHrine 0.3 mg/0.3 mL IJ SOAJ injection epinephrine 0.3 mg/0.3 mL injection, auto-injector    . Ipratropium-Albuterol (COMBIVENT) 20-100 MCG/ACT AERS respimat Inhale 2 puffs into the lungs 4 (four) times daily as needed.    Marland Kitchen ipratropium-albuterol (DUONEB) 0.5-2.5 (3) MG/3ML SOLN Take 3 mLs by nebulization 3 (three) times daily. 360 mL 2  . lidocaine (XYLOCAINE) 2 % solution     . meloxicam (MOBIC) 15 MG tablet meloxicam 15 mg tablet    . methocarbamol (ROBAXIN) 500 MG tablet Take 1 tablet (500 mg total) by mouth every 8 (eight) hours as needed for muscle spasms. 30 tablet 0  . metoprolol tartrate (LOPRESSOR) 25 MG tablet Take 1 tablet (25 mg total) by mouth 2 (two) times daily. 60 tablet 1  . nicotine (NICODERM CQ - DOSED IN MG/24 HOURS) 14 mg/24hr patch Place 1 patch (14 mg total) onto the skin daily. 28 patch 0  . oxyCODONE-acetaminophen (PERCOCET/ROXICET) 5-325 MG tablet Take 1 tablet by mouth every 8 (eight) hours as needed for moderate pain. 12 tablet 0  . pantoprazole (PROTONIX) 40 MG tablet Take 1 tablet (40 mg total) by mouth daily. 30 tablet 3  . predniSONE (DELTASONE) 20 MG tablet Take 1 tablet by mouth daily.    . ROBITUSSIN 12 HOUR COUGH 30 MG/5ML liquid Take 30 mg by mouth every 4 (four) hours as needed for cough.     . topiramate (TOPAMAX) 25 MG tablet Take 1 tablet by mouth daily at bedtime X 1 week; then increase to 25mg  BID. 45 tablet 2   No current facility-administered medications for this visit.    SURGICAL HISTORY:  Past Surgical History:  Procedure Laterality Date  . CHOLECYSTECTOMY    . COLONOSCOPY  2015   Results requested from [facility]  . COLONOSCOPY    .  ESOPHAGOGASTRODUODENOSCOPY N/A 08/14/2015   Procedure: ESOPHAGOGASTRODUODENOSCOPY (EGD);  Surgeon: Daneil Dolin, MD;  Location: [facility];  Service: Endoscopy;  Laterality: N/A;  215   . ESOPHAGOGASTRODUODENOSCOPY (EGD) WITH PROPOFOL N/A 09/13/2015   Procedure: ESOPHAGOGASTRODUODENOSCOPY (EGD) WITH PROPOFOL;  Surgeon: Milus Banister, MD;  Location: [facility];  Service: Endoscopy;  Laterality: N/A;  . EUS N/A 03/12/2017   Procedure: UPPER ENDOSCOPIC ULTRASOUND (EUS) RADIAL;  Surgeon: Milus Banister, MD;  Location: [facility];  Service: Endoscopy;  Laterality: N/A;  . TUMOR REMOVAL  2005   Benign  . UPPER ESOPHAGEAL ENDOSCOPIC ULTRASOUND (EUS)  09/13/2015   Procedure: UPPER ESOPHAGEAL ENDOSCOPIC ULTRASOUND (EUS);  Surgeon: Milus Banister, MD;  Location: [facility];  Service: Endoscopy;;  .  VIDEO BRONCHOSCOPY WITH ENDOBRONCHIAL NAVIGATION N/A 12/31/2015   Procedure: VIDEO BRONCHOSCOPY WITH ENDOBRONCHIAL NAVIGATION;  Surgeon: Melrose Nakayama, MD;  Location: [facility];  Service: Thoracic;  Laterality: N/A;  . VIDEO BRONCHOSCOPY WITH ENDOBRONCHIAL ULTRASOUND N/A 11/08/2015   Procedure: VIDEO BRONCHOSCOPY WITH ENDOBRONCHIAL ULTRASOUND;  Surgeon: Ivin Poot, MD;  Location: [facility];  Service: Thoracic;  Laterality: N/A;    REVIEW OF SYSTEMS:  Constitutional: negative Eyes: negative Ears, nose, mouth, throat, and face: negative Respiratory: positive for dyspnea on exertion Cardiovascular: negative Gastrointestinal: negative Genitourinary:negative Integument/breast: negative Hematologic/lymphatic: negative Musculoskeletal:negative Neurological: negative Behavioral/Psych: negative Endocrine: negative Allergic/Immunologic: negative   PHYSICAL EXAMINATION: General appearance: alert, cooperative, fatigued and no distress Head: Normocephalic, without obvious abnormality, atraumatic Neck: no adenopathy, no JVD, supple, symmetrical, trachea midline and thyroid not enlarged, symmetric, no  tenderness/mass/nodules Lymph nodes: Cervical, supraclavicular, and axillary nodes normal. Resp: clear to auscultation bilaterally Back: symmetric, no curvature. ROM normal. No CVA tenderness. Cardio: regular rate and rhythm, S1, S2 normal, no murmur, click, rub or gallop GI: soft, non-tender; bowel sounds normal; no masses,  no organomegaly Extremities: extremities normal, atraumatic, no cyanosis or edema Neurologic: Alert and oriented X 3, normal strength and tone. Normal symmetric reflexes. Normal coordination and gait  ECOG PERFORMANCE STATUS: 1 - Symptomatic but completely ambulatory  Blood pressure 102/71, pulse 94, temperature 97.8 F (36.6 C), temperature source Tympanic, resp. rate 17, height 5\' 7"  (1.702 m), weight 141 lb 11.2 oz (64.3 kg), SpO2 98 %.  LABORATORY DATA: Lab Results  Component Value Date   WBC 8.5 06/05/2020   HGB 13.3 06/05/2020   HCT 39.9 06/05/2020   MCV 85.6 06/05/2020   PLT 279 06/05/2020      Chemistry      Component Value Date/Time   NA 141 06/05/2020 1143   NA 144 04/09/2017 0948   K 4.9 06/05/2020 1143   K 4.3 04/09/2017 0948   CL 104 06/05/2020 1143   CO2 28 06/05/2020 1143   CO2 25 04/09/2017 0948   BUN 21 06/05/2020 1143   BUN 16.8 04/09/2017 0948   CREATININE 0.83 06/05/2020 1143   CREATININE 0.8 04/09/2017 0948      Component Value Date/Time   CALCIUM 9.6 06/05/2020 1143   CALCIUM 9.1 04/09/2017 0948   ALKPHOS 222 (H) 06/05/2020 1143   ALKPHOS 216 (H) 04/09/2017 0948   AST 20 06/05/2020 1143   AST 12 04/09/2017 0948   ALT 13 06/05/2020 1143   ALT 13 04/09/2017 0948   BILITOT <0.2 (L) 06/05/2020 1143   BILITOT 0.22 04/09/2017 0948       RADIOGRAPHIC STUDIES:-   CT Chest W Contrast  Result Date: 06/06/2020 CLINICAL DATA:  Non-small-cell lung cancer. Chemotherapy completed in December. Intermittent chest pain. Cough. Shortness of breath. Right upper quadrant pain. Abdominal bulge. Constipation. EXAM: CT CHEST, ABDOMEN, AND  PELVIS WITH CONTRAST TECHNIQUE: Multidetector CT imaging of the chest, abdomen and pelvis was performed following the standard protocol during bolus administration of intravenous contrast. CONTRAST:  160mL OMNIPAQUE IOHEXOL 300 MG/ML  SOLN COMPARISON:  02/24/2020 FINDINGS: CT CHEST FINDINGS Cardiovascular: Aortic atherosclerosis. Normal heart size, without pericardial effusion. Multivessel coronary artery atherosclerosis. No central pulmonary embolism, on this non-dedicated study. Mediastinum/Nodes: No supraclavicular adenopathy. No mediastinal or hilar adenopathy. Lungs/Pleura: No pleural fluid. Moderate centrilobular emphysema. Lower lobe predominant bronchial wall thickening. Development of an irregular right upper lobe lung mass of 5.0 x 3.5 cm on 31/6. Posterior satellite pleural-based nodularity including at 1.2 cm on 32/6. Central right  middle lobe collapse is similar to on the prior. More peripheral right middle lobe pleural based soft tissue thickening is similar on 99/6. Mild left upper lobe subpleural nodularity medially including at 4 mm on 36/6, similar. Musculoskeletal: No acute osseous abnormality. Similar right seventh rib anterior sclerotic lesion, likely a bone island. T12 hemangioma. CT ABDOMEN PELVIS FINDINGS Hepatobiliary: Normal liver. Cholecystectomy, without biliary ductal dilatation. Pancreas: Normal, without mass or ductal dilatation. Spleen: Normal in size, without focal abnormality. Adrenals/Urinary Tract: Normal adrenal glands. Bilateral too small to characterize renal lesions. Normal urinary bladder. Stomach/Bowel: Proximal gastric underdistention. Colonic stool burden suggests constipation. Normal terminal ileum and appendix. Normal small bowel. Vascular/Lymphatic: Advanced aortic and branch vessel atherosclerosis. No abdominopelvic adenopathy. Reproductive: Calcified uterine fibroids.  No adnexal mass. Other: No significant free fluid. No evidence of omental or peritoneal disease.  Musculoskeletal: Osteopenia. IMPRESSION: 1. Development of right apical lung mass, most consistent with recurrent/metastatic disease. 2. No thoracic adenopathy. 3. No findings of abdominopelvic metastatic disease. 4. Aortic atherosclerosis (ICD10-I70.0), coronary artery atherosclerosis and emphysema (ICD10-J43.9). 5.  Possible constipation. 6. Chronic right middle lobe atelectasis. Electronically Signed   By: Abigail Miyamoto M.D.   On: 06/06/2020 12:32   CT Abdomen Pelvis W Contrast  Result Date: 06/06/2020 CLINICAL DATA:  Non-small-cell lung cancer. Chemotherapy completed in December. Intermittent chest pain. Cough. Shortness of breath. Right upper quadrant pain. Abdominal bulge. Constipation. EXAM: CT CHEST, ABDOMEN, AND PELVIS WITH CONTRAST TECHNIQUE: Multidetector CT imaging of the chest, abdomen and pelvis was performed following the standard protocol during bolus administration of intravenous contrast. CONTRAST:  163mL OMNIPAQUE IOHEXOL 300 MG/ML  SOLN COMPARISON:  02/24/2020 FINDINGS: CT CHEST FINDINGS Cardiovascular: Aortic atherosclerosis. Normal heart size, without pericardial effusion. Multivessel coronary artery atherosclerosis. No central pulmonary embolism, on this non-dedicated study. Mediastinum/Nodes: No supraclavicular adenopathy. No mediastinal or hilar adenopathy. Lungs/Pleura: No pleural fluid. Moderate centrilobular emphysema. Lower lobe predominant bronchial wall thickening. Development of an irregular right upper lobe lung mass of 5.0 x 3.5 cm on 31/6. Posterior satellite pleural-based nodularity including at 1.2 cm on 32/6. Central right middle lobe collapse is similar to on the prior. More peripheral right middle lobe pleural based soft tissue thickening is similar on 99/6. Mild left upper lobe subpleural nodularity medially including at 4 mm on 36/6, similar. Musculoskeletal: No acute osseous abnormality. Similar right seventh rib anterior sclerotic lesion, likely a bone island. T12  hemangioma. CT ABDOMEN PELVIS FINDINGS Hepatobiliary: Normal liver. Cholecystectomy, without biliary ductal dilatation. Pancreas: Normal, without mass or ductal dilatation. Spleen: Normal in size, without focal abnormality. Adrenals/Urinary Tract: Normal adrenal glands. Bilateral too small to characterize renal lesions. Normal urinary bladder. Stomach/Bowel: Proximal gastric underdistention. Colonic stool burden suggests constipation. Normal terminal ileum and appendix. Normal small bowel. Vascular/Lymphatic: Advanced aortic and branch vessel atherosclerosis. No abdominopelvic adenopathy. Reproductive: Calcified uterine fibroids.  No adnexal mass. Other: No significant free fluid. No evidence of omental or peritoneal disease. Musculoskeletal: Osteopenia. IMPRESSION: 1. Development of right apical lung mass, most consistent with recurrent/metastatic disease. 2. No thoracic adenopathy. 3. No findings of abdominopelvic metastatic disease. 4. Aortic atherosclerosis (ICD10-I70.0), coronary artery atherosclerosis and emphysema (ICD10-J43.9). 5.  Possible constipation. 6. Chronic right middle lobe atelectasis. Electronically Signed   By: Abigail Miyamoto M.D.   On: 06/06/2020 12:32    ASSESSMENT AND PLAN:  This is a very pleasant 70 years old African-American female with a recurrent non-small cell lung cancer initially diagnosed as stage IIIA non-small cell lung cancer status post concurrent chemoradiation followed by consolidation  chemotherapy with carboplatin and paclitaxel for 3 cycles. The patient was followed by observation but restaging imaging studies showed evidence for disease progression. She was started on second line treatment with immunotherapy with Nivolumab 480 mg IV every 4 weeks status post 32 cycles.   The patient has been in observation since August 2021 with no concerning complaints except for the baseline shortness of breath and intermittent headache. She had repeat CT scan of the chest, abdomen  pelvis performed recently.  I personally and independently reviewed the scan images and discussed the results and showed the images to the patient today. Her scan showed development of right apical lung mass consistent with recurrent/metastatic disease.  There is no other abnormalities detected on the scan. I recommended for the patient to have a PET scan for further evaluation of her disease especially with the development of the right apical lung mass. I will see her back for follow-up visit in 2 weeks for evaluation and more detailed discussion of her treatment options based on the PET scan results. The patient was advised to call immediately if she has any concerning symptoms in the interval. The patient voices understanding of current disease status and treatment options and is in agreement with the current care plan. All questions were answered. The patient knows to call the clinic with any problems, questions or concerns. We can certainly see the patient much sooner if necessary.  Disclaimer: This note was dictated with voice recognition software. Similar sounding words can inadvertently be transcribed and may not be corrected upon review.

## 2020-06-18 ENCOUNTER — Other Ambulatory Visit: Payer: Self-pay

## 2020-06-18 ENCOUNTER — Ambulatory Visit (HOSPITAL_COMMUNITY)
Admission: RE | Admit: 2020-06-18 | Discharge: 2020-06-18 | Disposition: A | Discharge status: Home / Self Care | Payer: Medicare HMO | Source: Ambulatory Visit | Attending: Internal Medicine | Admitting: Internal Medicine

## 2020-06-18 DIAGNOSIS — C3490 Malignant neoplasm of unspecified part of unspecified bronchus or lung: Secondary | ICD-10-CM

## 2020-06-18 DIAGNOSIS — I700 Atherosclerosis of aorta: Secondary | ICD-10-CM | POA: Diagnosis not present

## 2020-06-18 DIAGNOSIS — C3491 Malignant neoplasm of unspecified part of right bronchus or lung: Secondary | ICD-10-CM | POA: Insufficient documentation

## 2020-06-18 DIAGNOSIS — J439 Emphysema, unspecified: Secondary | ICD-10-CM | POA: Diagnosis not present

## 2020-06-18 LAB — GLUCOSE, CAPILLARY: Glucose-Capillary: 92 mg/dL (ref 70–99)

## 2020-06-18 MED ORDER — FLUDEOXYGLUCOSE F - 18 (FDG) INJECTION
7.0000 | Freq: Once | INTRAVENOUS | Status: AC | PRN
  Administered 2020-06-18: 7 via INTRAVENOUS

## 2020-06-21 ENCOUNTER — Other Ambulatory Visit: Payer: Self-pay

## 2020-06-21 ENCOUNTER — Encounter: Payer: Self-pay | Admitting: Internal Medicine

## 2020-06-21 ENCOUNTER — Inpatient Hospital Stay: Payer: Medicare HMO | Attending: Internal Medicine | Admitting: Internal Medicine

## 2020-06-21 VITALS — BP 137/89 | HR 102 | Temp 97.8°F | Resp 102 | Ht 67.0 in | Wt 144.3 lb

## 2020-06-21 DIAGNOSIS — Z79899 Other long term (current) drug therapy: Secondary | ICD-10-CM | POA: Insufficient documentation

## 2020-06-21 DIAGNOSIS — C3491 Malignant neoplasm of unspecified part of right bronchus or lung: Secondary | ICD-10-CM | POA: Diagnosis not present

## 2020-06-21 DIAGNOSIS — Z5112 Encounter for antineoplastic immunotherapy: Secondary | ICD-10-CM | POA: Diagnosis present

## 2020-06-21 NOTE — Progress Notes (Signed)
[facility] [phone]   [phone]  OFFICE PROGRESS NOTE  Barry Dienes, NP No address on file  DIAGNOSIS: Recurrent non-small cell lung cancer initially diagnosed as stage IIIA (T1b, N2, M0) non-small cell lung cancer presented with right middle lobe pulmonary nodule, mediastinal lymphadenopathy and highly suspicious for small nodule in the left upper lobe that could change her stage to stage IV that could present another synchronous primary lesion in the left upper lobe. This was diagnosed in September 2017.  PRIOR THERAPY:  1) Concurrent chemoradiation with weekly carboplatin for AUC of 2 and paclitaxel 45 MG/M2 status post 6 cycles last dose was given 02/25/2016 with partial response. 2) Consolidation chemotherapy with carboplatin for AUC of 5 and paclitaxel 175 MG/M2 every 3 weeks with Neulasta support. First dose 04/22/2016. Status post 3 cycles. 3) Second line treatment with immunotherapy with Nivolumab 480 mg IV every 4 weeks status post 32 cycles.  Discontinued secondary to intolerance and frequent hospitalization with pneumonia and pneumonitis.  She had a break of treatment between November 24, 2019 until June 28, 2020.  CURRENT THERAPY: Resuming her treatment with immunotherapy with nivolumab 480 mg IV every 4 weeks.  Cycle #33 will start on June 28, 2020..  INTERVAL HISTORY: Courtney Zuniga 70 y.o. female returns to the clinic today for follow-up visit.  The patient is feeling fine today with no concerning complaints except for the baseline shortness of breath and mild cough.  She is currently on home oxygen.  She denied having any current chest pain or hemoptysis.  She denied having any recent weight loss or night sweats.  She has no nausea, vomiting, diarrhea or constipation.  She has no headache or visual changes.  She has been in observation for the last 6 months.  The patient was found on recent CT scan of the chest to have suspicious  disease progression in the right upper lobe.  She had a PET scan performed recently and she is here for evaluation and discussion of her treatment options based on the PET scan results.  MEDICAL HISTORY: Past Medical History:  Diagnosis Date  . Anemia    as a young woman  . Arthritis    osteoartritis  . Asthma   . Brain tumor (benign) ([place]) 2005 [facility]   Benign  . Chronic headaches   . Chronic hip pain   . Chronic pain   . COPD (chronic obstructive pulmonary disease) ([place])   . Coronary artery disease   . Depression   . Depression 05/15/2016  . Encounter for antineoplastic chemotherapy 01/10/2016  . GERD (gastroesophageal reflux disease)   . Hypertension   . Lung cancer ([place]) dx'd 01/2016   currently on chemo and radiation   . NSTEMI (non-ST elevated myocardial infarction) ([place]) yrs ago  . On home O2    qhs 2 liters at hs and prn  . Pneumonia last time 2 yrs ago  . Shortness of breath dyspnea    with activity    ALLERGIES:  has No Known Allergies.  MEDICATIONS:  Current Outpatient Medications  Medication Sig Dispense Refill  . acetaminophen (TYLENOL) 325 MG tablet Take 2 tablets (650 mg total) by mouth every 6 (six) hours as needed for mild pain, fever or headache. 12 tablet 2  . albuterol (PROVENTIL HFA;VENTOLIN HFA) 108 (90 BASE) MCG/ACT inhaler Inhale 2 puffs into the lungs every 4 (four) hours as needed for shortness of breath.     Marland Kitchen atorvastatin (LIPITOR)  10 MG tablet atorvastatin 10 mg tablet    . BREO ELLIPTA 100-25 MCG/INH AEPB Inhale 1 puff into the lungs daily.     . Cholecalciferol (VITAMIN D3) 125 MCG (5000 UT) CAPS Take 1 capsule by mouth daily.  0  . dextromethorphan-guaiFENesin (MUCINEX DM) 30-600 MG 12hr tablet Take 1 tablet by mouth 2 (two) times daily. 40 tablet 0  . diclofenac sodium (VOLTAREN) 1 % GEL Apply topically 4 (four) times daily as needed (massge gel into affected area(s) as needed for pain).   1  . DULoxetine (CYMBALTA) 30 MG capsule duloxetine  30 mg capsule,delayed release    . Dupilumab (DUPIXENT) 300 MG/2ML SOPN Dupixent 300 mg/2 mL subcutaneous pen injector    . ENSURE (ENSURE) Take 237 mLs by mouth 3 (three) times daily between meals.    . Ipratropium-Albuterol (COMBIVENT) 20-100 MCG/ACT AERS respimat Inhale 2 puffs into the lungs 4 (four) times daily as needed.    Marland Kitchen ipratropium-albuterol (DUONEB) 0.5-2.5 (3) MG/3ML SOLN Take 3 mLs by nebulization 3 (three) times daily. 360 mL 2  . metoprolol tartrate (LOPRESSOR) 25 MG tablet Take 1 tablet (25 mg total) by mouth 2 (two) times daily. 60 tablet 1  . nicotine (NICODERM CQ - DOSED IN MG/24 HOURS) 14 mg/24hr patch Place 1 patch (14 mg total) onto the skin daily. 28 patch 0  . oxyCODONE-acetaminophen (PERCOCET/ROXICET) 5-325 MG tablet Take 1 tablet by mouth every 8 (eight) hours as needed for moderate pain. 12 tablet 0  . pantoprazole (PROTONIX) 40 MG tablet Take 1 tablet (40 mg total) by mouth daily. 30 tablet 3  . ROBITUSSIN 12 HOUR COUGH 30 MG/5ML liquid Take 30 mg by mouth every 4 (four) hours as needed for cough.     . cyclobenzaprine (FLEXERIL) 10 MG tablet Take 1 tablet (10 mg total) by mouth 2 (two) times daily. *MAy take one additional tablet as needed for muscle spasms 60 tablet 2  . meloxicam (MOBIC) 15 MG tablet meloxicam 15 mg tablet    . methocarbamol (ROBAXIN) 500 MG tablet Take 1 tablet (500 mg total) by mouth every 8 (eight) hours as needed for muscle spasms. 30 tablet 0  . topiramate (TOPAMAX) 25 MG tablet Take 1 tablet by mouth daily at bedtime X 1 week; then increase to 25mg  BID. 45 tablet 2   No current facility-administered medications for this visit.    SURGICAL HISTORY:  Past Surgical History:  Procedure Laterality Date  . CHOLECYSTECTOMY    . COLONOSCOPY  2015   Results requested from [facility]  . COLONOSCOPY    . ESOPHAGOGASTRODUODENOSCOPY N/A 08/14/2015   Procedure: ESOPHAGOGASTRODUODENOSCOPY (EGD);  Surgeon: Daneil Dolin, MD;  Location: [facility]  ENDO SUITE;  Service: Endoscopy;  Laterality: N/A;  215   . ESOPHAGOGASTRODUODENOSCOPY (EGD) WITH PROPOFOL N/A 09/13/2015   Procedure: ESOPHAGOGASTRODUODENOSCOPY (EGD) WITH PROPOFOL;  Surgeon: Milus Banister, MD;  Location: [facility] ENDOSCOPY;  Service: Endoscopy;  Laterality: N/A;  . EUS N/A 03/12/2017   Procedure: UPPER ENDOSCOPIC ULTRASOUND (EUS) RADIAL;  Surgeon: Milus Banister, MD;  Location: [facility] ENDOSCOPY;  Service: Endoscopy;  Laterality: N/A;  . TUMOR REMOVAL  2005   Benign  . UPPER ESOPHAGEAL ENDOSCOPIC ULTRASOUND (EUS)  09/13/2015   Procedure: UPPER ESOPHAGEAL ENDOSCOPIC ULTRASOUND (EUS);  Surgeon: Milus Banister, MD;  Location: [facility];  Service: Endoscopy;;  . VIDEO BRONCHOSCOPY WITH ENDOBRONCHIAL NAVIGATION N/A 12/31/2015   Procedure: VIDEO BRONCHOSCOPY WITH ENDOBRONCHIAL NAVIGATION;  Surgeon: Melrose Nakayama, MD;  Location: [facility];  Service: Thoracic;  Laterality: N/A;  . VIDEO BRONCHOSCOPY WITH ENDOBRONCHIAL ULTRASOUND N/A 11/08/2015   Procedure: VIDEO BRONCHOSCOPY WITH ENDOBRONCHIAL ULTRASOUND;  Surgeon: Ivin Poot, MD;  Location: [facility] OR;  Service: Thoracic;  Laterality: N/A;    REVIEW OF SYSTEMS:  Constitutional: positive for fatigue Eyes: negative Ears, nose, mouth, throat, and face: negative Respiratory: positive for cough and dyspnea on exertion Cardiovascular: negative Gastrointestinal: negative Genitourinary:negative Integument/breast: negative Hematologic/lymphatic: negative Musculoskeletal:negative Neurological: negative Behavioral/Psych: negative Endocrine: negative Allergic/Immunologic: negative   PHYSICAL EXAMINATION: General appearance: alert, cooperative, fatigued and no distress Head: Normocephalic, without obvious abnormality, atraumatic Neck: no adenopathy, no JVD, supple, symmetrical, trachea midline and thyroid not enlarged, symmetric, no tenderness/mass/nodules Lymph nodes: Cervical, supraclavicular, and axillary nodes normal. Resp: clear to  auscultation bilaterally Back: symmetric, no curvature. ROM normal. No CVA tenderness. Cardio: regular rate and rhythm, S1, S2 normal, no murmur, click, rub or gallop GI: soft, non-tender; bowel sounds normal; no masses,  no organomegaly Extremities: extremities normal, atraumatic, no cyanosis or edema Neurologic: Alert and oriented X 3, normal strength and tone. Normal symmetric reflexes. Normal coordination and gait  ECOG PERFORMANCE STATUS: 1 - Symptomatic but completely ambulatory  Blood pressure 137/89, pulse (!) 102, temperature 97.8 F (36.6 C), temperature source Tympanic, resp. rate (!) 102, height 5\' 7"  (1.702 m), weight 144 lb 4.8 oz (65.5 kg), SpO2 100 %.  LABORATORY DATA: Lab Results  Component Value Date   WBC 8.5 06/05/2020   HGB 13.3 06/05/2020   HCT 39.9 06/05/2020   MCV 85.6 06/05/2020   PLT 279 06/05/2020      Chemistry      Component Value Date/Time   NA 141 06/05/2020 1143   NA 144 04/09/2017 0948   K 4.9 06/05/2020 1143   K 4.3 04/09/2017 0948   CL 104 06/05/2020 1143   CO2 28 06/05/2020 1143   CO2 25 04/09/2017 0948   BUN 21 06/05/2020 1143   BUN 16.8 04/09/2017 0948   CREATININE 0.83 06/05/2020 1143   CREATININE 0.8 04/09/2017 0948      Component Value Date/Time   CALCIUM 9.6 06/05/2020 1143   CALCIUM 9.1 04/09/2017 0948   ALKPHOS 222 (H) 06/05/2020 1143   ALKPHOS 216 (H) 04/09/2017 0948   AST 20 06/05/2020 1143   AST 12 04/09/2017 0948   ALT 13 06/05/2020 1143   ALT 13 04/09/2017 0948   BILITOT <0.2 (L) 06/05/2020 1143   BILITOT 0.22 04/09/2017 0948       RADIOGRAPHIC STUDIES:-   CT Chest W Contrast  Result Date: 06/06/2020 CLINICAL DATA:  Non-small-cell lung cancer. Chemotherapy completed in December. Intermittent chest pain. Cough. Shortness of breath. Right upper quadrant pain. Abdominal bulge. Constipation. EXAM: CT CHEST, ABDOMEN, AND PELVIS WITH CONTRAST TECHNIQUE: Multidetector CT imaging of the chest, abdomen and pelvis was  performed following the standard protocol during bolus administration of intravenous contrast. CONTRAST:  180mL OMNIPAQUE IOHEXOL 300 MG/ML  SOLN COMPARISON:  02/24/2020 FINDINGS: CT CHEST FINDINGS Cardiovascular: Aortic atherosclerosis. Normal heart size, without pericardial effusion. Multivessel coronary artery atherosclerosis. No central pulmonary embolism, on this non-dedicated study. Mediastinum/Nodes: No supraclavicular adenopathy. No mediastinal or hilar adenopathy. Lungs/Pleura: No pleural fluid. Moderate centrilobular emphysema. Lower lobe predominant bronchial wall thickening. Development of an irregular right upper lobe lung mass of 5.0 x 3.5 cm on 31/6. Posterior satellite pleural-based nodularity including at 1.2 cm on 32/6. Central right middle lobe collapse is similar to on the prior. More peripheral right middle lobe pleural based soft tissue thickening is  similar on 99/6. Mild left upper lobe subpleural nodularity medially including at 4 mm on 36/6, similar. Musculoskeletal: No acute osseous abnormality. Similar right seventh rib anterior sclerotic lesion, likely a bone island. T12 hemangioma. CT ABDOMEN PELVIS FINDINGS Hepatobiliary: Normal liver. Cholecystectomy, without biliary ductal dilatation. Pancreas: Normal, without mass or ductal dilatation. Spleen: Normal in size, without focal abnormality. Adrenals/Urinary Tract: Normal adrenal glands. Bilateral too small to characterize renal lesions. Normal urinary bladder. Stomach/Bowel: Proximal gastric underdistention. Colonic stool burden suggests constipation. Normal terminal ileum and appendix. Normal small bowel. Vascular/Lymphatic: Advanced aortic and branch vessel atherosclerosis. No abdominopelvic adenopathy. Reproductive: Calcified uterine fibroids.  No adnexal mass. Other: No significant free fluid. No evidence of omental or peritoneal disease. Musculoskeletal: Osteopenia. IMPRESSION: 1. Development of right apical lung mass, most  consistent with recurrent/metastatic disease. 2. No thoracic adenopathy. 3. No findings of abdominopelvic metastatic disease. 4. Aortic atherosclerosis (ICD10-I70.0), coronary artery atherosclerosis and emphysema (ICD10-J43.9). 5.  Possible constipation. 6. Chronic right middle lobe atelectasis. Electronically Signed   By: Abigail Miyamoto M.D.   On: 06/06/2020 12:32   CT Abdomen Pelvis W Contrast  Result Date: 06/06/2020 CLINICAL DATA:  Non-small-cell lung cancer. Chemotherapy completed in December. Intermittent chest pain. Cough. Shortness of breath. Right upper quadrant pain. Abdominal bulge. Constipation. EXAM: CT CHEST, ABDOMEN, AND PELVIS WITH CONTRAST TECHNIQUE: Multidetector CT imaging of the chest, abdomen and pelvis was performed following the standard protocol during bolus administration of intravenous contrast. CONTRAST:  159mL OMNIPAQUE IOHEXOL 300 MG/ML  SOLN COMPARISON:  02/24/2020 FINDINGS: CT CHEST FINDINGS Cardiovascular: Aortic atherosclerosis. Normal heart size, without pericardial effusion. Multivessel coronary artery atherosclerosis. No central pulmonary embolism, on this non-dedicated study. Mediastinum/Nodes: No supraclavicular adenopathy. No mediastinal or hilar adenopathy. Lungs/Pleura: No pleural fluid. Moderate centrilobular emphysema. Lower lobe predominant bronchial wall thickening. Development of an irregular right upper lobe lung mass of 5.0 x 3.5 cm on 31/6. Posterior satellite pleural-based nodularity including at 1.2 cm on 32/6. Central right middle lobe collapse is similar to on the prior. More peripheral right middle lobe pleural based soft tissue thickening is similar on 99/6. Mild left upper lobe subpleural nodularity medially including at 4 mm on 36/6, similar. Musculoskeletal: No acute osseous abnormality. Similar right seventh rib anterior sclerotic lesion, likely a bone island. T12 hemangioma. CT ABDOMEN PELVIS FINDINGS Hepatobiliary: Normal liver. Cholecystectomy, without  biliary ductal dilatation. Pancreas: Normal, without mass or ductal dilatation. Spleen: Normal in size, without focal abnormality. Adrenals/Urinary Tract: Normal adrenal glands. Bilateral too small to characterize renal lesions. Normal urinary bladder. Stomach/Bowel: Proximal gastric underdistention. Colonic stool burden suggests constipation. Normal terminal ileum and appendix. Normal small bowel. Vascular/Lymphatic: Advanced aortic and branch vessel atherosclerosis. No abdominopelvic adenopathy. Reproductive: Calcified uterine fibroids.  No adnexal mass. Other: No significant free fluid. No evidence of omental or peritoneal disease. Musculoskeletal: Osteopenia. IMPRESSION: 1. Development of right apical lung mass, most consistent with recurrent/metastatic disease. 2. No thoracic adenopathy. 3. No findings of abdominopelvic metastatic disease. 4. Aortic atherosclerosis (ICD10-I70.0), coronary artery atherosclerosis and emphysema (ICD10-J43.9). 5.  Possible constipation. 6. Chronic right middle lobe atelectasis. Electronically Signed   By: Abigail Miyamoto M.D.   On: 06/06/2020 12:32   NM PET Image Restage (PS) Skull Base to Thigh  Result Date: 06/18/2020 CLINICAL DATA:  Subsequent treatment strategy for lung cancer. Right upper lobe mass on recent chest CT suspicious for recurrent lung cancer. EXAM: NUCLEAR MEDICINE PET SKULL BASE TO THIGH TECHNIQUE: 7.0 mCi F-18 FDG was injected intravenously. Full-ring PET imaging was performed from the  skull base to thigh after the radiotracer. CT data was obtained and used for attenuation correction and anatomic localization. Fasting blood glucose: 92 mg/dl COMPARISON:  CTs of the chest, abdomen and pelvis 06/05/2020 and 02/24/2020. PET-CT 10/23/2015. FINDINGS: Mediastinal blood pool activity: SUV max 1.6 NECK: No hypermetabolic cervical lymph nodes are identified.There are no lesions of the pharyngeal mucosal space. Incidental CT findings: Bilateral carotid atherosclerosis.  CHEST: There are no hypermetabolic mediastinal, hilar or axillary lymph nodes. Hypermetabolic right upper lobe mass or infiltrate, similar in appearance to recent chest CT and measuring 3.7 x 3.0 cm on image 17/8. This has an SUV max of 13.8. A 1.2 x 0.9 cm satellite nodule on image 16/8 is mildly hypermetabolic with an SUV max of 3.1. There are stable treatment changes in the right perihilar region with chronic right middle lobe collapse. No associated hypermetabolic activity in this region. There is also mild right lower lobe and lingular scarring. Incidental CT findings: Moderate centrilobular emphysema. Diffuse atherosclerosis of the aorta, great vessels and coronary arteries. ABDOMEN/PELVIS: There is no hypermetabolic activity within the liver, adrenal glands, spleen or pancreas. There is no hypermetabolic nodal activity. Incidental CT findings: Status post cholecystectomy. Diffuse aortic and branch vessel atherosclerosis. Calcified uterine fibroids are again noted. SKELETON: There is no hypermetabolic activity to suggest osseous metastatic disease. Incidental CT findings: none IMPRESSION: 1. The recently demonstrated right upper lobe process is hypermetabolic and little changed over the last 11 days. While this could reflect bronchogenic carcinoma, there was no suspicious finding in this area on the prior chest CT of just 4 months ago, and this could reflect pneumonia rather than malignancy. If that is a clinical possibility, suggest follow-up chest CT in 3 months. 2. No other findings suspicious for malignancy/metastatic disease. 3. Stable treatment changes in the right middle lobe. Aortic Atherosclerosis (ICD10-I70.0) and Emphysema (ICD10-J43.9). Electronically Signed   By: Richardean Sale M.D.   On: 06/18/2020 14:02    ASSESSMENT AND PLAN:  This is a very pleasant 70 years old African-American female with a recurrent non-small cell lung cancer initially diagnosed as stage IIIA non-small cell lung cancer  status post concurrent chemoradiation followed by consolidation chemotherapy with carboplatin and paclitaxel for 3 cycles. The patient was followed by observation but restaging imaging studies showed evidence for disease progression. She was started on second line treatment with immunotherapy with Nivolumab 480 mg IV every 4 weeks status post 32 cycles.   The patient has been in observation since August 2021 with no concerning complaints except for the baseline shortness of breath and intermittent headache. Recent imaging studies including CT scan of the chest scan showed development of right apical lung mass consistent with recurrent/metastatic disease.  I ordered a PET scan which was performed recently.  I personally and independently reviewed the scan images and discussed the results with the patient today. Her scan showed persistent right upper lobe hypermetabolic activity still suspicious for bronchogenic carcinoma but inflammatory process could be contributing to the hypermetabolic activity. I had a lengthy discussion with the patient today about her current condition and treatment options. I gave her the option of continuous observation and monitoring versus resuming her treatment with immunotherapy with nivolumab 480 mg IV every 4 weeks.  The patient is interested in resuming her treatment again and she is expected to start cycle #33 next week. I will see the patient back for follow-up visit in 5 weeks for evaluation before the next cycle of her treatment. She was advised to call  immediately if she has any other concerning symptoms in the interval.  The patient voices understanding of current disease status and treatment options and is in agreement with the current care plan. All questions were answered. The patient knows to call the clinic with any problems, questions or concerns. We can certainly see the patient much sooner if necessary.  Disclaimer: This note was dictated with voice  recognition software. Similar sounding words can inadvertently be transcribed and may not be corrected upon review.

## 2020-06-25 ENCOUNTER — Telehealth: Payer: Self-pay

## 2020-06-25 NOTE — Telephone Encounter (Signed)
Pt called wanting to know what time her infusion is on Thursday 3/17? I don't see this has been scheduled just yet. I asked the pt if she cannot be infused here at [facility], where would she prefer to go. Pt prefers to then go to [facility] for infusion because she uses transportation and cannot go to [facility].    Pt was advised if she misses your call, please listen to her VM as the appt details will be left there. She expressed understanding of this.   Schedule message sent with this information.

## 2020-06-26 ENCOUNTER — Telehealth: Payer: Self-pay | Admitting: Internal Medicine

## 2020-06-26 NOTE — Telephone Encounter (Signed)
Called and spoke with patient about infusion on 3/18l. Patient stated she will not be able to make it that early due to transportation and also can not go to another facility. Advised patient I will work on trying to schedule a later time and will call her back today

## 2020-06-26 NOTE — Telephone Encounter (Signed)
Called and spoke with patient about 3/18 appt. She has found other transportation and can keep appt as is.

## 2020-06-29 ENCOUNTER — Other Ambulatory Visit: Payer: Self-pay

## 2020-06-29 ENCOUNTER — Inpatient Hospital Stay: Payer: Medicare HMO

## 2020-06-29 VITALS — BP 111/77 | HR 98 | Temp 97.8°F | Resp 24

## 2020-06-29 DIAGNOSIS — Z5112 Encounter for antineoplastic immunotherapy: Secondary | ICD-10-CM | POA: Diagnosis not present

## 2020-06-29 DIAGNOSIS — C3491 Malignant neoplasm of unspecified part of right bronchus or lung: Secondary | ICD-10-CM

## 2020-06-29 LAB — CBC WITH DIFFERENTIAL (CANCER CENTER ONLY)
Abs Immature Granulocytes: 0.01 10*3/uL (ref 0.00–0.07)
Basophils Absolute: 0 10*3/uL (ref 0.0–0.1)
Basophils Relative: 1 %
Eosinophils Absolute: 0.1 10*3/uL (ref 0.0–0.5)
Eosinophils Relative: 1 %
HCT: 38 % (ref 36.0–46.0)
Hemoglobin: 13 g/dL (ref 12.0–15.0)
Immature Granulocytes: 0 %
Lymphocytes Relative: 29 %
Lymphs Abs: 1.5 10*3/uL (ref 0.7–4.0)
MCH: 28.9 pg (ref 26.0–34.0)
MCHC: 34.2 g/dL (ref 30.0–36.0)
MCV: 84.4 fL (ref 80.0–100.0)
Monocytes Absolute: 0.5 10*3/uL (ref 0.1–1.0)
Monocytes Relative: 10 %
Neutro Abs: 2.9 10*3/uL (ref 1.7–7.7)
Neutrophils Relative %: 59 %
Platelet Count: 223 10*3/uL (ref 150–400)
RBC: 4.5 MIL/uL (ref 3.87–5.11)
RDW: 15.2 % (ref 11.5–15.5)
WBC Count: 5 10*3/uL (ref 4.0–10.5)
nRBC: 0 % (ref 0.0–0.2)

## 2020-06-29 LAB — CMP (CANCER CENTER ONLY)
ALT: 10 U/L (ref 0–44)
AST: 17 U/L (ref 15–41)
Albumin: 3.5 g/dL (ref 3.5–5.0)
Alkaline Phosphatase: 250 U/L — ABNORMAL HIGH (ref 38–126)
Anion gap: 7 (ref 5–15)
BUN: 17 mg/dL (ref 8–23)
CO2: 25 mmol/L (ref 22–32)
Calcium: 9 mg/dL (ref 8.9–10.3)
Chloride: 108 mmol/L (ref 98–111)
Creatinine: 0.74 mg/dL (ref 0.44–1.00)
GFR, Estimated: >60 mL/min (ref >60)
Glucose, Bld: 76 mg/dL (ref 70–99)
Potassium: 3.8 mmol/L (ref 3.5–5.1)
Sodium: 140 mmol/L (ref 135–145)
Total Bilirubin: <0.2 mg/dL — ABNORMAL LOW (ref 0.3–1.2)
Total Protein: 6.5 g/dL (ref 6.5–8.1)

## 2020-06-29 LAB — TSH: TSH: 3.745 u[IU]/mL (ref 0.308–3.960)

## 2020-06-29 MED ORDER — SODIUM CHLORIDE 0.9 % IV SOLN
Freq: Once | INTRAVENOUS | Status: AC
  Filled 2020-06-29: qty 250

## 2020-06-29 MED ORDER — SODIUM CHLORIDE 0.9 % IV SOLN
480.0000 mg | Freq: Once | INTRAVENOUS | Status: AC
  Administered 2020-06-29: 480 mg via INTRAVENOUS
  Filled 2020-06-29: qty 48

## 2020-06-29 NOTE — Patient Instructions (Signed)
[facility] Discharge Instructions for Patients Receiving Chemotherapy  Today you received the following chemotherapy agents: nivolumab.  To help prevent nausea and vomiting after your treatment, we encourage you to take your nausea medication as directed.   If you develop nausea and vomiting that is not controlled by your nausea medication, call the clinic.   BELOW ARE SYMPTOMS THAT SHOULD BE REPORTED IMMEDIATELY:  *FEVER GREATER THAN 100.5 F  *CHILLS WITH OR WITHOUT FEVER  NAUSEA AND VOMITING THAT IS NOT CONTROLLED WITH YOUR NAUSEA MEDICATION  *UNUSUAL SHORTNESS OF BREATH  *UNUSUAL BRUISING OR BLEEDING  TENDERNESS IN MOUTH AND THROAT WITH OR WITHOUT PRESENCE OF ULCERS  *URINARY PROBLEMS  *BOWEL PROBLEMS  UNUSUAL RASH Items with * indicate a potential emergency and should be followed up as soon as possible.  Feel free to call the clinic should you have any questions or concerns. The clinic phone number is [phone].  Please show the CHEMO ALERT CARD at check-in to the Emergency Department and triage nurse.   

## 2020-07-26 ENCOUNTER — Inpatient Hospital Stay: Payer: Medicare HMO

## 2020-07-26 ENCOUNTER — Encounter: Payer: Self-pay | Admitting: Internal Medicine

## 2020-07-26 ENCOUNTER — Inpatient Hospital Stay: Payer: Medicare HMO | Attending: Internal Medicine | Admitting: Internal Medicine

## 2020-07-26 ENCOUNTER — Other Ambulatory Visit: Payer: Self-pay

## 2020-07-26 VITALS — HR 52

## 2020-07-26 VITALS — BP 126/85 | HR 106 | Temp 97.4°F | Resp 20 | Ht 67.0 in | Wt 141.2 lb

## 2020-07-26 DIAGNOSIS — Z87891 Personal history of nicotine dependence: Secondary | ICD-10-CM | POA: Insufficient documentation

## 2020-07-26 DIAGNOSIS — C3491 Malignant neoplasm of unspecified part of right bronchus or lung: Secondary | ICD-10-CM | POA: Diagnosis not present

## 2020-07-26 DIAGNOSIS — Z79899 Other long term (current) drug therapy: Secondary | ICD-10-CM | POA: Diagnosis not present

## 2020-07-26 DIAGNOSIS — Z5112 Encounter for antineoplastic immunotherapy: Secondary | ICD-10-CM | POA: Diagnosis present

## 2020-07-26 DIAGNOSIS — C342 Malignant neoplasm of middle lobe, bronchus or lung: Secondary | ICD-10-CM | POA: Insufficient documentation

## 2020-07-26 LAB — CMP (CANCER CENTER ONLY)
ALT: 11 U/L (ref 0–44)
AST: 21 U/L (ref 15–41)
Albumin: 3.9 g/dL (ref 3.5–5.0)
Alkaline Phosphatase: 286 U/L — ABNORMAL HIGH (ref 38–126)
Anion gap: 16 — ABNORMAL HIGH (ref 5–15)
BUN: 17 mg/dL (ref 8–23)
CO2: 25 mmol/L (ref 22–32)
Calcium: 9.3 mg/dL (ref 8.9–10.3)
Chloride: 106 mmol/L (ref 98–111)
Creatinine: 0.74 mg/dL (ref 0.44–1.00)
GFR, Estimated: >60 mL/min (ref >60)
Glucose, Bld: 72 mg/dL (ref 70–99)
Potassium: 3.8 mmol/L (ref 3.5–5.1)
Sodium: 147 mmol/L — ABNORMAL HIGH (ref 135–145)
Total Bilirubin: <0.2 mg/dL — ABNORMAL LOW (ref 0.3–1.2)
Total Protein: 7.2 g/dL (ref 6.5–8.1)

## 2020-07-26 LAB — CBC WITH DIFFERENTIAL (CANCER CENTER ONLY)
Abs Immature Granulocytes: 0.02 10*3/uL (ref 0.00–0.07)
Basophils Absolute: 0 10*3/uL (ref 0.0–0.1)
Basophils Relative: 1 %
Eosinophils Absolute: 0.1 10*3/uL (ref 0.0–0.5)
Eosinophils Relative: 1 %
HCT: 40.3 % (ref 36.0–46.0)
Hemoglobin: 13.9 g/dL (ref 12.0–15.0)
Immature Granulocytes: 0 %
Lymphocytes Relative: 19 %
Lymphs Abs: 1 10*3/uL (ref 0.7–4.0)
MCH: 28.8 pg (ref 26.0–34.0)
MCHC: 34.5 g/dL (ref 30.0–36.0)
MCV: 83.4 fL (ref 80.0–100.0)
Monocytes Absolute: 0.3 10*3/uL (ref 0.1–1.0)
Monocytes Relative: 6 %
Neutro Abs: 4 10*3/uL (ref 1.7–7.7)
Neutrophils Relative %: 73 %
Platelet Count: 229 10*3/uL (ref 150–400)
RBC: 4.83 MIL/uL (ref 3.87–5.11)
RDW: 15.1 % (ref 11.5–15.5)
WBC Count: 5.5 10*3/uL (ref 4.0–10.5)
nRBC: 0 % (ref 0.0–0.2)

## 2020-07-26 LAB — TSH: TSH: 2.498 u[IU]/mL (ref 0.308–3.960)

## 2020-07-26 MED ORDER — SODIUM CHLORIDE 0.9 % IV SOLN
480.0000 mg | Freq: Once | INTRAVENOUS | Status: AC
  Administered 2020-07-26: 480 mg via INTRAVENOUS
  Filled 2020-07-26: qty 48

## 2020-07-26 MED ORDER — SODIUM CHLORIDE 0.9 % IV SOLN
Freq: Once | INTRAVENOUS | Status: AC
  Filled 2020-07-26: qty 250

## 2020-07-26 NOTE — Progress Notes (Signed)
[place] [phone]   [phone]  OFFICE PROGRESS NOTE  Barry Dienes, NP No address on file  DIAGNOSIS: Recurrent non-small cell lung cancer initially diagnosed as stage IIIA (T1b, N2, M0) non-small cell lung cancer presented with right middle lobe pulmonary nodule, mediastinal lymphadenopathy and highly suspicious for small nodule in the left upper lobe that could change her stage to stage IV that could present another synchronous primary lesion in the left upper lobe. This was diagnosed in September 2017.  PRIOR THERAPY:  1) Concurrent chemoradiation with weekly carboplatin for AUC of 2 and paclitaxel 45 MG/M2 status post 6 cycles last dose was given 02/25/2016 with partial response. 2) Consolidation chemotherapy with carboplatin for AUC of 5 and paclitaxel 175 MG/M2 every 3 weeks with Neulasta support. First dose 04/22/2016. Status post 3 cycles. 3) Second line treatment with immunotherapy with Nivolumab 480 mg IV every 4 weeks status post 32 cycles.  Discontinued secondary to intolerance and frequent hospitalization with pneumonia and pneumonitis.  She had a break of treatment between November 24, 2019 until June 28, 2020.  CURRENT THERAPY: Resuming her treatment with immunotherapy with nivolumab 480 mg IV every 4 weeks.  Cycle #33 will start on June 28, 2020..  INTERVAL HISTORY: Courtney Zuniga 70 y.o. female returns to the clinic today for follow-up visit.  The patient tolerated the last cycle of her treatment with nivolumab fairly well.  She continues to have the baseline shortness of breath and she is currently on home oxygen 3 L/min.  She also has the persistent frontal headache behind the left eye.  She has been investigated for this problem many times with imaging studies including MRI of the brain few times with no significant findings. She denied having any chest pain continues to have mild cough with no hemoptysis.  She denied having any  fever or chills.  She has no nausea, vomiting, diarrhea or constipation.  She is here today for evaluation before starting cycle #34 of her treatment.   MEDICAL HISTORY: Past Medical History:  Diagnosis Date  . Anemia    as a young woman  . Arthritis    osteoartritis  . Asthma   . Brain tumor (benign) ([place]) 2005 [facility]   Benign  . Chronic headaches   . Chronic hip pain   . Chronic pain   . COPD (chronic obstructive pulmonary disease) ([place])   . Coronary artery disease   . Depression   . Depression 05/15/2016  . Encounter for antineoplastic chemotherapy 01/10/2016  . GERD (gastroesophageal reflux disease)   . Hypertension   . Lung cancer ([place]) dx'd 01/2016   currently on chemo and radiation   . NSTEMI (non-ST elevated myocardial infarction) ([place]) yrs ago  . On home O2    qhs 2 liters at hs and prn  . Pneumonia last time 2 yrs ago  . Shortness of breath dyspnea    with activity    ALLERGIES:  has No Known Allergies.  MEDICATIONS:  Current Outpatient Medications  Medication Sig Dispense Refill  . acetaminophen (TYLENOL) 325 MG tablet Take 2 tablets (650 mg total) by mouth every 6 (six) hours as needed for mild pain, fever or headache. 12 tablet 2  . albuterol (PROVENTIL HFA;VENTOLIN HFA) 108 (90 BASE) MCG/ACT inhaler Inhale 2 puffs into the lungs every 4 (four) hours as needed for shortness of breath.     Marland Kitchen atorvastatin (LIPITOR) 10 MG tablet atorvastatin 10 mg tablet    .  BREO ELLIPTA 100-25 MCG/INH AEPB Inhale 1 puff into the lungs daily.     . Cholecalciferol (VITAMIN D3) 125 MCG (5000 UT) CAPS Take 1 capsule by mouth daily.  0  . cyclobenzaprine (FLEXERIL) 10 MG tablet Take 1 tablet (10 mg total) by mouth 2 (two) times daily. *MAy take one additional tablet as needed for muscle spasms 60 tablet 2  . dextromethorphan-guaiFENesin (MUCINEX DM) 30-600 MG 12hr tablet Take 1 tablet by mouth 2 (two) times daily. 40 tablet 0  . diclofenac sodium (VOLTAREN) 1 % GEL Apply topically 4  (four) times daily as needed (massge gel into affected area(s) as needed for pain).   1  . DULoxetine (CYMBALTA) 30 MG capsule duloxetine 30 mg capsule,delayed release    . Dupilumab (DUPIXENT) 300 MG/2ML SOPN Dupixent 300 mg/2 mL subcutaneous pen injector    . ENSURE (ENSURE) Take 237 mLs by mouth 3 (three) times daily between meals.    . Ipratropium-Albuterol (COMBIVENT) 20-100 MCG/ACT AERS respimat Inhale 2 puffs into the lungs 4 (four) times daily as needed.    Marland Kitchen ipratropium-albuterol (DUONEB) 0.5-2.5 (3) MG/3ML SOLN Take 3 mLs by nebulization 3 (three) times daily. 360 mL 2  . meloxicam (MOBIC) 15 MG tablet meloxicam 15 mg tablet    . methocarbamol (ROBAXIN) 500 MG tablet Take 1 tablet (500 mg total) by mouth every 8 (eight) hours as needed for muscle spasms. 30 tablet 0  . metoprolol tartrate (LOPRESSOR) 25 MG tablet Take 1 tablet (25 mg total) by mouth 2 (two) times daily. 60 tablet 1  . nicotine (NICODERM CQ - DOSED IN MG/24 HOURS) 14 mg/24hr patch Place 1 patch (14 mg total) onto the skin daily. 28 patch 0  . oxyCODONE-acetaminophen (PERCOCET/ROXICET) 5-325 MG tablet Take 1 tablet by mouth every 8 (eight) hours as needed for moderate pain. 12 tablet 0  . pantoprazole (PROTONIX) 40 MG tablet Take 1 tablet (40 mg total) by mouth daily. 30 tablet 3  . ROBITUSSIN 12 HOUR COUGH 30 MG/5ML liquid Take 30 mg by mouth every 4 (four) hours as needed for cough.     . topiramate (TOPAMAX) 25 MG tablet Take 1 tablet by mouth daily at bedtime X 1 week; then increase to 25mg  BID. 45 tablet 2   No current facility-administered medications for this visit.    SURGICAL HISTORY:  Past Surgical History:  Procedure Laterality Date  . CHOLECYSTECTOMY    . COLONOSCOPY  2015   Results requested from [facility]  . COLONOSCOPY    . ESOPHAGOGASTRODUODENOSCOPY N/A 08/14/2015   Procedure: ESOPHAGOGASTRODUODENOSCOPY (EGD);  Surgeon: Daneil Dolin, MD;  Location: [facility];  Service: Endoscopy;   Laterality: N/A;  215   . ESOPHAGOGASTRODUODENOSCOPY (EGD) WITH PROPOFOL N/A 09/13/2015   Procedure: ESOPHAGOGASTRODUODENOSCOPY (EGD) WITH PROPOFOL;  Surgeon: Milus Banister, MD;  Location: [facility];  Service: Endoscopy;  Laterality: N/A;  . EUS N/A 03/12/2017   Procedure: UPPER ENDOSCOPIC ULTRASOUND (EUS) RADIAL;  Surgeon: Milus Banister, MD;  Location: [facility];  Service: Endoscopy;  Laterality: N/A;  . TUMOR REMOVAL  2005   Benign  . UPPER ESOPHAGEAL ENDOSCOPIC ULTRASOUND (EUS)  09/13/2015   Procedure: UPPER ESOPHAGEAL ENDOSCOPIC ULTRASOUND (EUS);  Surgeon: Milus Banister, MD;  Location: [facility];  Service: Endoscopy;;  . VIDEO BRONCHOSCOPY WITH ENDOBRONCHIAL NAVIGATION N/A 12/31/2015   Procedure: VIDEO BRONCHOSCOPY WITH ENDOBRONCHIAL NAVIGATION;  Surgeon: Melrose Nakayama, MD;  Location: [facility];  Service: Thoracic;  Laterality: N/A;  . VIDEO BRONCHOSCOPY WITH  ENDOBRONCHIAL ULTRASOUND N/A 11/08/2015   Procedure: VIDEO BRONCHOSCOPY WITH ENDOBRONCHIAL ULTRASOUND;  Surgeon: Ivin Poot, MD;  Location: [facility] OR;  Service: Thoracic;  Laterality: N/A;    REVIEW OF SYSTEMS:  A comprehensive review of systems was negative except for: Constitutional: positive for fatigue Respiratory: positive for dyspnea on exertion Neurological: positive for headaches   PHYSICAL EXAMINATION: General appearance: alert, cooperative, fatigued and no distress Head: Normocephalic, without obvious abnormality, atraumatic Neck: no adenopathy, no JVD, supple, symmetrical, trachea midline and thyroid not enlarged, symmetric, no tenderness/mass/nodules Lymph nodes: Cervical, supraclavicular, and axillary nodes normal. Resp: clear to auscultation bilaterally Back: symmetric, no curvature. ROM normal. No CVA tenderness. Cardio: regular rate and rhythm, S1, S2 normal, no murmur, click, rub or gallop GI: soft, non-tender; bowel sounds normal; no masses,  no organomegaly Extremities: extremities normal,  atraumatic, no cyanosis or edema  ECOG PERFORMANCE STATUS: 1 - Symptomatic but completely ambulatory  Blood pressure 126/85, pulse (!) 106, temperature (!) 97.4 F (36.3 C), temperature source Tympanic, resp. rate 20, height 5\' 7"  (1.702 m), weight 141 lb 3.2 oz (64 kg), SpO2 98 %.  LABORATORY DATA: Lab Results  Component Value Date   WBC 5.5 07/26/2020   HGB 13.9 07/26/2020   HCT 40.3 07/26/2020   MCV 83.4 07/26/2020   PLT 229 07/26/2020      Chemistry      Component Value Date/Time   NA 140 06/29/2020 0712   NA 144 04/09/2017 0948   K 3.8 06/29/2020 0712   K 4.3 04/09/2017 0948   CL 108 06/29/2020 0712   CO2 25 06/29/2020 0712   CO2 25 04/09/2017 0948   BUN 17 06/29/2020 0712   BUN 16.8 04/09/2017 0948   CREATININE 0.74 06/29/2020 0712   CREATININE 0.8 04/09/2017 0948      Component Value Date/Time   CALCIUM 9.0 06/29/2020 0712   CALCIUM 9.1 04/09/2017 0948   ALKPHOS 250 (H) 06/29/2020 0712   ALKPHOS 216 (H) 04/09/2017 0948   AST 17 06/29/2020 0712   AST 12 04/09/2017 0948   ALT 10 06/29/2020 0712   ALT 13 04/09/2017 0948   BILITOT <0.2 (L) 06/29/2020 0712   BILITOT 0.22 04/09/2017 0948       RADIOGRAPHIC STUDIES:-   No results found.  ASSESSMENT AND PLAN:  This is a very pleasant 70 years old African-American female with a recurrent non-small cell lung cancer initially diagnosed as stage IIIA non-small cell lung cancer status post concurrent chemoradiation followed by consolidation chemotherapy with carboplatin and paclitaxel for 3 cycles. The patient was followed by observation but restaging imaging studies showed evidence for disease progression. She was started on second line treatment with immunotherapy with Nivolumab 480 mg IV every 4 weeks status post 32 cycles.   The patient has been in observation since August 2021 with no concerning complaints except for the baseline shortness of breath and intermittent headache. Recent imaging studies including CT  scan of the chest scan showed development of right apical lung mass consistent with recurrent/metastatic disease.  I ordered a PET scan which was performed recently.  I personally and independently reviewed the scan images and discussed the results with the patient today. Her scan showed persistent right upper lobe hypermetabolic activity still suspicious for bronchogenic carcinoma but inflammatory process could be contributing to the hypermetabolic activity. The patient was interested in resuming her treatment again and she resumed cycle #33 of her treatment with single agent nivolumab 480 mg IV every 4 weeks last months. The  patient tolerated the last cycle of her treatment well with no concerning adverse effects. I recommended for her to proceed with cycle #34 today as planned. I will see her back for follow-up visit in 4 weeks for evaluation before the next cycle of her treatment. The patient was advised to call immediately if she has any other concerning symptoms in the interval.  The patient voices understanding of current disease status and treatment options and is in agreement with the current care plan. All questions were answered. The patient knows to call the clinic with any problems, questions or concerns. We can certainly see the patient much sooner if necessary.  Disclaimer: This note was dictated with voice recognition software. Similar sounding words can inadvertently be transcribed and may not be corrected upon review.

## 2020-07-26 NOTE — Patient Instructions (Signed)
[facility] Discharge Instructions for Patients Receiving Chemotherapy  Today you received the following chemotherapy agents Opdivo  To help prevent nausea and vomiting after your treatment, we encourage you to take your nausea medication as directed   If you develop nausea and vomiting that is not controlled by your nausea medication, call the clinic.   BELOW ARE SYMPTOMS THAT SHOULD BE REPORTED IMMEDIATELY:  *FEVER GREATER THAN 100.5 F  *CHILLS WITH OR WITHOUT FEVER  NAUSEA AND VOMITING THAT IS NOT CONTROLLED WITH YOUR NAUSEA MEDICATION  *UNUSUAL SHORTNESS OF BREATH  *UNUSUAL BRUISING OR BLEEDING  TENDERNESS IN MOUTH AND THROAT WITH OR WITHOUT PRESENCE OF ULCERS  *URINARY PROBLEMS  *BOWEL PROBLEMS  UNUSUAL RASH Items with * indicate a potential emergency and should be followed up as soon as possible.  Feel free to call the clinic should you have any questions or concerns. The clinic phone number is [phone].  Please show the CHEMO ALERT CARD at check-in to the Emergency Department and triage nurse.   

## 2020-07-26 NOTE — Progress Notes (Signed)
Per Dr Julien Nordmann, it is okay to treat pt today with Nivolumab and heart rate of 1106.

## 2020-08-06 ENCOUNTER — Emergency Department (HOSPITAL_COMMUNITY): Payer: Medicare HMO

## 2020-08-06 ENCOUNTER — Encounter (HOSPITAL_COMMUNITY): Payer: Self-pay | Admitting: *Deleted

## 2020-08-06 ENCOUNTER — Inpatient Hospital Stay (HOSPITAL_COMMUNITY)
Admission: EM | Admit: 2020-08-06 | Discharge: 2020-08-09 | DRG: 871 | Disposition: A | Discharge status: Home Health | Payer: Medicare HMO | Attending: Family Medicine | Admitting: Family Medicine

## 2020-08-06 ENCOUNTER — Other Ambulatory Visit: Payer: Self-pay

## 2020-08-06 DIAGNOSIS — Z9981 Dependence on supplemental oxygen: Secondary | ICD-10-CM

## 2020-08-06 DIAGNOSIS — Z791 Long term (current) use of non-steroidal anti-inflammatories (NSAID): Secondary | ICD-10-CM

## 2020-08-06 DIAGNOSIS — J9622 Acute and chronic respiratory failure with hypercapnia: Secondary | ICD-10-CM | POA: Diagnosis present

## 2020-08-06 DIAGNOSIS — I2510 Atherosclerotic heart disease of native coronary artery without angina pectoris: Secondary | ICD-10-CM | POA: Diagnosis present

## 2020-08-06 DIAGNOSIS — C3491 Malignant neoplasm of unspecified part of right bronchus or lung: Secondary | ICD-10-CM | POA: Diagnosis present

## 2020-08-06 DIAGNOSIS — Z8042 Family history of malignant neoplasm of prostate: Secondary | ICD-10-CM | POA: Diagnosis not present

## 2020-08-06 DIAGNOSIS — I10 Essential (primary) hypertension: Secondary | ICD-10-CM | POA: Diagnosis present

## 2020-08-06 DIAGNOSIS — Z20822 Contact with and (suspected) exposure to covid-19: Secondary | ICD-10-CM | POA: Diagnosis present

## 2020-08-06 DIAGNOSIS — Z79899 Other long term (current) drug therapy: Secondary | ICD-10-CM

## 2020-08-06 DIAGNOSIS — R6520 Severe sepsis without septic shock: Secondary | ICD-10-CM | POA: Diagnosis present

## 2020-08-06 DIAGNOSIS — K219 Gastro-esophageal reflux disease without esophagitis: Secondary | ICD-10-CM | POA: Diagnosis present

## 2020-08-06 DIAGNOSIS — A419 Sepsis, unspecified organism: Principal | ICD-10-CM | POA: Diagnosis present

## 2020-08-06 DIAGNOSIS — D849 Immunodeficiency, unspecified: Secondary | ICD-10-CM | POA: Diagnosis present

## 2020-08-06 DIAGNOSIS — R0600 Dyspnea, unspecified: Secondary | ICD-10-CM

## 2020-08-06 DIAGNOSIS — C3411 Malignant neoplasm of upper lobe, right bronchus or lung: Secondary | ICD-10-CM | POA: Diagnosis present

## 2020-08-06 DIAGNOSIS — Z801 Family history of malignant neoplasm of trachea, bronchus and lung: Secondary | ICD-10-CM | POA: Diagnosis not present

## 2020-08-06 DIAGNOSIS — J45909 Unspecified asthma, uncomplicated: Secondary | ICD-10-CM | POA: Diagnosis present

## 2020-08-06 DIAGNOSIS — J189 Pneumonia, unspecified organism: Secondary | ICD-10-CM | POA: Diagnosis present

## 2020-08-06 DIAGNOSIS — G4733 Obstructive sleep apnea (adult) (pediatric): Secondary | ICD-10-CM | POA: Diagnosis present

## 2020-08-06 DIAGNOSIS — I252 Old myocardial infarction: Secondary | ICD-10-CM | POA: Diagnosis not present

## 2020-08-06 DIAGNOSIS — Z8041 Family history of malignant neoplasm of ovary: Secondary | ICD-10-CM | POA: Diagnosis not present

## 2020-08-06 DIAGNOSIS — Z808 Family history of malignant neoplasm of other organs or systems: Secondary | ICD-10-CM | POA: Diagnosis not present

## 2020-08-06 DIAGNOSIS — J439 Emphysema, unspecified: Secondary | ICD-10-CM | POA: Diagnosis present

## 2020-08-06 DIAGNOSIS — J45901 Unspecified asthma with (acute) exacerbation: Secondary | ICD-10-CM | POA: Diagnosis not present

## 2020-08-06 DIAGNOSIS — J441 Chronic obstructive pulmonary disease with (acute) exacerbation: Secondary | ICD-10-CM | POA: Diagnosis present

## 2020-08-06 DIAGNOSIS — D6859 Other primary thrombophilia: Secondary | ICD-10-CM | POA: Diagnosis present

## 2020-08-06 DIAGNOSIS — Z87891 Personal history of nicotine dependence: Secondary | ICD-10-CM | POA: Diagnosis not present

## 2020-08-06 DIAGNOSIS — R0602 Shortness of breath: Secondary | ICD-10-CM

## 2020-08-06 DIAGNOSIS — J9621 Acute and chronic respiratory failure with hypoxia: Secondary | ICD-10-CM | POA: Diagnosis present

## 2020-08-06 LAB — COMPREHENSIVE METABOLIC PANEL
ALT: 32 U/L (ref 0–44)
AST: 33 U/L (ref 15–41)
Albumin: 3.7 g/dL (ref 3.5–5.0)
Alkaline Phosphatase: 233 U/L — ABNORMAL HIGH (ref 38–126)
Anion gap: 12 (ref 5–15)
BUN: 17 mg/dL (ref 8–23)
CO2: 25 mmol/L (ref 22–32)
Calcium: 8.8 mg/dL — ABNORMAL LOW (ref 8.9–10.3)
Chloride: 99 mmol/L (ref 98–111)
Creatinine, Ser: 0.7 mg/dL (ref 0.44–1.00)
GFR, Estimated: >60 mL/min (ref >60)
Glucose, Bld: 96 mg/dL (ref 70–99)
Potassium: 4 mmol/L (ref 3.5–5.1)
Sodium: 136 mmol/L (ref 135–145)
Total Bilirubin: 0.3 mg/dL (ref 0.3–1.2)
Total Protein: 7 g/dL (ref 6.5–8.1)

## 2020-08-06 LAB — CBC WITH DIFFERENTIAL/PLATELET
Abs Immature Granulocytes: 0.04 10*3/uL (ref 0.00–0.07)
Basophils Absolute: 0 10*3/uL (ref 0.0–0.1)
Basophils Relative: 0 %
Eosinophils Absolute: 0 10*3/uL (ref 0.0–0.5)
Eosinophils Relative: 0 %
HCT: 38.2 % (ref 36.0–46.0)
Hemoglobin: 12.8 g/dL (ref 12.0–15.0)
Immature Granulocytes: 0 %
Lymphocytes Relative: 7 %
Lymphs Abs: 0.7 10*3/uL (ref 0.7–4.0)
MCH: 28.7 pg (ref 26.0–34.0)
MCHC: 33.5 g/dL (ref 30.0–36.0)
MCV: 85.7 fL (ref 80.0–100.0)
Monocytes Absolute: 0.8 10*3/uL (ref 0.1–1.0)
Monocytes Relative: 8 %
Neutro Abs: 8.2 10*3/uL — ABNORMAL HIGH (ref 1.7–7.7)
Neutrophils Relative %: 85 %
Platelets: 186 10*3/uL (ref 150–400)
RBC: 4.46 MIL/uL (ref 3.87–5.11)
RDW: 15.4 % (ref 11.5–15.5)
WBC: 9.7 10*3/uL (ref 4.0–10.5)
nRBC: 0 % (ref 0.0–0.2)

## 2020-08-06 LAB — RESP PANEL BY RT-PCR (FLU A&B, COVID) ARPGX2
Influenza A by PCR: NEGATIVE
Influenza B by PCR: NEGATIVE
SARS Coronavirus 2 by RT PCR: NEGATIVE

## 2020-08-06 LAB — TROPONIN I (HIGH SENSITIVITY)
Troponin I (High Sensitivity): 4 ng/L (ref <18)
Troponin I (High Sensitivity): 4 ng/L (ref <18)

## 2020-08-06 LAB — MRSA PCR SCREENING: MRSA by PCR: NEGATIVE

## 2020-08-06 LAB — BRAIN NATRIURETIC PEPTIDE: B Natriuretic Peptide: 47 pg/mL (ref 0.0–100.0)

## 2020-08-06 LAB — PROCALCITONIN: Procalcitonin: <0.1 ng/mL

## 2020-08-06 MED ORDER — CEFTRIAXONE SODIUM 2 G IJ SOLR: 2.0000 g | INTRAMUSCULAR | Status: DC

## 2020-08-06 MED ORDER — ALBUTEROL SULFATE (2.5 MG/3ML) 0.083% IN NEBU: 3.0000 mL | INHALATION_SOLUTION | RESPIRATORY_TRACT | Status: DC | PRN

## 2020-08-06 MED ORDER — IPRATROPIUM-ALBUTEROL 20-100 MCG/ACT IN AERS: 2.0000 | INHALATION_SPRAY | Freq: Four times a day (QID) | RESPIRATORY_TRACT | Status: DC | PRN

## 2020-08-06 MED ORDER — LEVOFLOXACIN IN D5W 750 MG/150ML IV SOLN
750.0000 mg | Freq: Once | INTRAVENOUS | Status: AC
  Administered 2020-08-06: 750 mg via INTRAVENOUS
  Filled 2020-08-06: qty 150

## 2020-08-06 MED ORDER — DM-GUAIFENESIN ER 30-600 MG PO TB12
1.0000 | ORAL_TABLET | Freq: Two times a day (BID) | ORAL | Status: DC
  Administered 2020-08-06 – 2020-08-09 (×6): 1 via ORAL
  Filled 2020-08-06 (×6): qty 1

## 2020-08-06 MED ORDER — OXYCODONE-ACETAMINOPHEN 5-325 MG PO TABS
1.0000 | ORAL_TABLET | Freq: Three times a day (TID) | ORAL | Status: DC | PRN
  Administered 2020-08-07 – 2020-08-08 (×3): 1 via ORAL
  Filled 2020-08-06 (×3): qty 1

## 2020-08-06 MED ORDER — FLUTICASONE FUROATE-VILANTEROL 100-25 MCG/INH IN AEPB
1.0000 | INHALATION_SPRAY | Freq: Every day | RESPIRATORY_TRACT | Status: DC
  Administered 2020-08-07 – 2020-08-09 (×3): 1 via RESPIRATORY_TRACT
  Filled 2020-08-06: qty 28

## 2020-08-06 MED ORDER — ENSURE ENLIVE PO LIQD
237.0000 mL | Freq: Three times a day (TID) | ORAL | Status: DC
  Administered 2020-08-07 (×2): 237 mL via ORAL
  Filled 2020-08-06 (×2): qty 237

## 2020-08-06 MED ORDER — IPRATROPIUM-ALBUTEROL 0.5-2.5 (3) MG/3ML IN SOLN: 3.0000 mL | Freq: Four times a day (QID) | RESPIRATORY_TRACT | Status: DC | PRN

## 2020-08-06 MED ORDER — TOPIRAMATE 25 MG PO TABS
25.0000 mg | ORAL_TABLET | Freq: Two times a day (BID) | ORAL | Status: DC
  Administered 2020-08-06 – 2020-08-09 (×6): 25 mg via ORAL
  Filled 2020-08-06 (×6): qty 1

## 2020-08-06 MED ORDER — SODIUM CHLORIDE 0.9 % IV SOLN
2.0000 g | INTRAVENOUS | Status: DC
  Administered 2020-08-07 – 2020-08-08 (×2): 2 g via INTRAVENOUS
  Filled 2020-08-06 (×2): qty 20

## 2020-08-06 MED ORDER — IPRATROPIUM-ALBUTEROL 0.5-2.5 (3) MG/3ML IN SOLN
3.0000 mL | Freq: Three times a day (TID) | RESPIRATORY_TRACT | Status: DC
Start: 2020-08-06 — End: 2020-08-09
  Administered 2020-08-07 – 2020-08-09 (×9): 3 mL via RESPIRATORY_TRACT
  Filled 2020-08-06 (×7): qty 3

## 2020-08-06 MED ORDER — METOPROLOL TARTRATE 25 MG PO TABS
25.0000 mg | ORAL_TABLET | Freq: Two times a day (BID) | ORAL | Status: DC
  Administered 2020-08-06 – 2020-08-09 (×6): 25 mg via ORAL
  Filled 2020-08-06 (×6): qty 1

## 2020-08-06 MED ORDER — PANTOPRAZOLE SODIUM 40 MG PO TBEC
40.0000 mg | DELAYED_RELEASE_TABLET | Freq: Every day | ORAL | Status: DC
  Administered 2020-08-07 – 2020-08-09 (×3): 40 mg via ORAL
  Filled 2020-08-06 (×3): qty 1

## 2020-08-06 MED ORDER — METHYLPREDNISOLONE SODIUM SUCC 40 MG IJ SOLR
40.0000 mg | Freq: Four times a day (QID) | INTRAMUSCULAR | Status: DC
  Administered 2020-08-06 – 2020-08-07 (×2): 40 mg via INTRAVENOUS
  Filled 2020-08-06 (×2): qty 1

## 2020-08-06 MED ORDER — METHYLPREDNISOLONE SODIUM SUCC 125 MG IJ SOLR
125.0000 mg | Freq: Once | INTRAMUSCULAR | Status: AC
  Administered 2020-08-06: 125 mg via INTRAVENOUS
  Filled 2020-08-06: qty 2

## 2020-08-06 MED ORDER — ACETAMINOPHEN 325 MG PO TABS
650.0000 mg | ORAL_TABLET | Freq: Four times a day (QID) | ORAL | Status: DC | PRN
  Administered 2020-08-06 – 2020-08-08 (×4): 650 mg via ORAL
  Filled 2020-08-06 (×4): qty 2

## 2020-08-06 MED ORDER — CYCLOBENZAPRINE HCL 10 MG PO TABS
10.0000 mg | ORAL_TABLET | Freq: Two times a day (BID) | ORAL | Status: DC
  Administered 2020-08-06 – 2020-08-08 (×5): 10 mg via ORAL
  Filled 2020-08-06 (×5): qty 1

## 2020-08-06 MED ORDER — MELOXICAM 7.5 MG PO TABS
15.0000 mg | ORAL_TABLET | Freq: Every day | ORAL | Status: DC
  Administered 2020-08-07: 15 mg via ORAL
  Filled 2020-08-06 (×2): qty 1

## 2020-08-06 MED ORDER — ATORVASTATIN CALCIUM 10 MG PO TABS
10.0000 mg | ORAL_TABLET | Freq: Every day | ORAL | Status: DC
  Administered 2020-08-07 – 2020-08-09 (×3): 10 mg via ORAL
  Filled 2020-08-06 (×3): qty 1

## 2020-08-06 MED ORDER — ENOXAPARIN SODIUM 40 MG/0.4ML ~~LOC~~ SOLN
40.0000 mg | SUBCUTANEOUS | Status: DC
  Administered 2020-08-06 – 2020-08-08 (×3): 40 mg via SUBCUTANEOUS
  Filled 2020-08-06 (×3): qty 0.4

## 2020-08-06 MED ORDER — IOHEXOL 350 MG/ML SOLN
100.0000 mL | Freq: Once | INTRAVENOUS | Status: AC | PRN
  Administered 2020-08-06: 100 mL via INTRAVENOUS

## 2020-08-06 MED ORDER — ALBUTEROL SULFATE HFA 108 (90 BASE) MCG/ACT IN AERS
2.0000 | INHALATION_SPRAY | Freq: Once | RESPIRATORY_TRACT | Status: AC
  Administered 2020-08-06: 2 via RESPIRATORY_TRACT
  Filled 2020-08-06: qty 6.7

## 2020-08-06 MED ORDER — DULOXETINE HCL 30 MG PO CPEP
30.0000 mg | ORAL_CAPSULE | Freq: Every day | ORAL | Status: DC
  Administered 2020-08-07 – 2020-08-09 (×3): 30 mg via ORAL
  Filled 2020-08-06 (×3): qty 1

## 2020-08-06 MED ORDER — SODIUM CHLORIDE 0.45 % IV SOLN: INTRAVENOUS | Status: DC

## 2020-08-06 MED ORDER — AZITHROMYCIN 250 MG PO TABS
500.0000 mg | ORAL_TABLET | ORAL | Status: DC
  Administered 2020-08-07 – 2020-08-08 (×2): 500 mg via ORAL
  Filled 2020-08-06 (×2): qty 2

## 2020-08-06 NOTE — Plan of Care (Signed)

## 2020-08-06 NOTE — H&P (Signed)
History and Physical    Courtney Zuniga UDJ:[id number] DOB: 01-Jun-1950 DOA: 08/06/2020  PCP: Barry Dienes, NP   Patient coming from: home  I have personally briefly reviewed patient's old medical records in [place]  Chief Complaint: cough, chest pain, increased SOB  HPI: Courtney Zuniga is a 70 y.o. 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 Starting yesterday she had cough, anterior chest pain described as a weight on her chest and increased SOB. EMS activated and patient transported to [facility] for evaluation.    ED Course: T 98.9  130/84  HR 118, RR 22. Lab: Cmet nl, BNP 47, Troponin 4, WBC 9.7 w/ 85/7/8, TSH 2.5. CTA negative for PE, reeals advanced centrilobular and paraseptal emphysema, RUL mass that has shrunk in the interval since last CT scan, patchy ground glass changes superior aspect RLL. EKG w/o acute changes. TRH called to admit for COPD exacerbation and possible CAP in an immunocompromised patient.   Review of Systems: As per HPI otherwise 10 point review of systems negative.    Past Medical History:  Diagnosis Date  . Anemia    as a young woman  . Arthritis    osteoartritis  . Asthma   . Brain tumor (benign) ([place]) 2005 [facility]   Benign  . Chronic headaches   . Chronic hip pain   . Chronic pain   . COPD (chronic obstructive pulmonary disease) ([place])   . Coronary artery disease   . Depression   . Depression 05/15/2016  . Encounter for antineoplastic chemotherapy 01/10/2016  . GERD (gastroesophageal reflux disease)   . Hypertension   . Lung cancer ([place]) dx'd 01/2016   currently on chemo and radiation   . NSTEMI (non-ST elevated myocardial infarction) ([place]) yrs ago  . On home O2    qhs 2 liters at hs and prn  . Pneumonia last time 2 yrs ago  . Shortness of breath dyspnea    with activity    Past Surgical  History:  Procedure Laterality Date  . CHOLECYSTECTOMY    . COLONOSCOPY  2015   Results requested from [facility]  . COLONOSCOPY    . ESOPHAGOGASTRODUODENOSCOPY N/A 08/14/2015   Procedure: ESOPHAGOGASTRODUODENOSCOPY (EGD);  Surgeon: Daneil Dolin, MD;  Location[facility];  Service: Endoscopy;  Laterality: N/A;  215   . ESOPHAGOGASTRODUODENOSCOPY (EGD) WITH PROPOFOL N/A 09/13/2015   Procedure: ESOPHAGOGASTRODUODENOSCOPY (EGD) WITH PROPOFOL;  Surgeon: Milus Banister, MD;  Location: [facility];  Service: Endoscopy;  Laterality: N/A;  . EUS N/A 03/12/2017   Procedure: UPPER ENDOSCOPIC ULTRASOUND (EUS) RADIAL;  Surgeon: Milus Banister, MD;  Location: [facility];  Service: Endoscopy;  Laterality: N/A;  . TUMOR REMOVAL  2005   Benign  . UPPER ESOPHAGEAL ENDOSCOPIC ULTRASOUND (EUS)  09/13/2015   Procedure: UPPER ESOPHAGEAL ENDOSCOPIC ULTRASOUND (EUS);  Surgeon: Milus Banister, MD;  Location: [facility];  Service: Endoscopy;;  . VIDEO BRONCHOSCOPY WITH ENDOBRONCHIAL NAVIGATION N/A 12/31/2015   Procedure: VIDEO BRONCHOSCOPY WITH ENDOBRONCHIAL NAVIGATION;  Surgeon: Melrose Nakayama, MD;  Location: [facility];  Service: Thoracic;  Laterality: N/A;  . VIDEO BRONCHOSCOPY WITH ENDOBRONCHIAL ULTRASOUND N/A 11/08/2015   Procedure: VIDEO BRONCHOSCOPY WITH ENDOBRONCHIAL ULTRASOUND;  Surgeon: Ivin Poot, MD;  Location: [facility];  Service: Thoracic;  Laterality: N/A;    Soc Hx - married 28 years - widowed. Has one son,  one daughter, 3 grandchildren, 5 great-grandchildren. She is retired. She lives alone and manages her ADLs but can call on her sister-in-law for help.    reports that she quit smoking about 2 years ago. Her smoking use included cigarettes. She has a 38.00 pack-year smoking history. She has never used smokeless tobacco. She reports that she does not drink alcohol and does not use drugs.  No Known Allergies  Family History  Problem Relation Age of Onset  . Ovarian cancer  Mother   . Lung cancer Father   . Lung cancer Brother   . Lung cancer Brother   . Prostate cancer Brother   . Brain cancer Sister        35 sister     Prior to Admission medications   Medication Sig Start Date End Date Taking? Authorizing Provider  acetaminophen (TYLENOL) 325 MG tablet Take 2 tablets (650 mg total) by mouth every 6 (six) hours as needed for mild pain, fever or headache. 05/08/18   Denton Brick, Courage, MD  albuterol (PROVENTIL HFA;VENTOLIN HFA) 108 (90 BASE) MCG/ACT inhaler Inhale 2 puffs into the lungs every 4 (four) hours as needed for shortness of breath.     [provider]  atorvastatin (LIPITOR) 10 MG tablet atorvastatin 10 mg tablet    [provider]  BREO ELLIPTA 100-25 MCG/INH AEPB Inhale 1 puff into the lungs daily.  04/30/17   [provider]  Cholecalciferol (VITAMIN D3) 125 MCG (5000 UT) CAPS Take 1 capsule by mouth daily. 02/17/18   [provider]  cyclobenzaprine (FLEXERIL) 10 MG tablet Take 1 tablet (10 mg total) by mouth 2 (two) times daily. *MAy take one additional tablet as needed for muscle spasms 05/08/18   Roxan Hockey, MD  dextromethorphan-guaiFENesin [facility] DM) 30-600 MG 12hr tablet Take 1 tablet by mouth 2 (two) times daily. 06/18/18   Barton Dubois, MD  diclofenac sodium (VOLTAREN) 1 % GEL Apply topically 4 (four) times daily as needed (massge gel into affected area(s) as needed for pain).  08/11/17   [provider]  DULoxetine (CYMBALTA) 30 MG capsule duloxetine 30 mg capsule,delayed release    [provider]  Dupilumab (DUPIXENT) 300 MG/2ML SOPN Dupixent 300 mg/2 mL subcutaneous pen injector 03/29/20   [provider]  ENSURE (ENSURE) Take 237 mLs by mouth 3 (three) times daily between meals.    [provider]  Ipratropium-Albuterol (COMBIVENT) 20-100 MCG/ACT AERS respimat Inhale 2 puffs into the lungs 4 (four) times daily as needed. 11/22/19 11/21/20  [provider]   ipratropium-albuterol (DUONEB) 0.5-2.5 (3) MG/3ML SOLN Take 3 mLs by nebulization 3 (three) times daily. 05/08/18   Roxan Hockey, MD  meloxicam (MOBIC) 15 MG tablet meloxicam 15 mg tablet    [provider]  methocarbamol (ROBAXIN) 500 MG tablet Take 1 tablet (500 mg total) by mouth every 8 (eight) hours as needed for muscle spasms. 06/15/19   Maudie Flakes, MD  metoprolol tartrate (LOPRESSOR) 25 MG tablet Take 1 tablet (25 mg total) by mouth 2 (two) times daily. 05/08/18   Roxan Hockey, MD  nicotine (NICODERM CQ - DOSED IN MG/24 HOURS) 14 mg/24hr patch Place 1 patch (14 mg total) onto the skin daily. 05/09/18   Roxan Hockey, MD  oxyCODONE-acetaminophen (PERCOCET/ROXICET) 5-325 MG tablet Take 1 tablet by mouth every 8 (eight) hours as needed for moderate pain. 05/08/18   Roxan Hockey, MD  pantoprazole (PROTONIX) 40 MG tablet Take 1 tablet (40 mg total) by mouth daily. 05/08/18  Roxan Hockey, MD  ROBITUSSIN 12 HOUR COUGH 30 MG/5ML liquid Take 30 mg by mouth every 4 (four) hours as needed for cough.  01/25/18   [provider]  topiramate (TOPAMAX) 25 MG tablet Take 1 tablet by mouth daily at bedtime X 1 week; then increase to 25mg  BID. 06/18/18   Barton Dubois, MD    Physical Exam: Vitals:   08/06/20 1500 08/06/20 1530 08/06/20 1600 08/06/20 1630  BP: 129/75 114/83 113/64 130/84  Pulse: (!) 122 (!) 121 (!) 112 (!) 118  Resp: 20 (!) 25 (!) 24 (!) 22  Temp:      TempSrc:      SpO2: 91% 95% 96% 100%  Weight:      Height:         Vitals:   08/06/20 1500 08/06/20 1530 08/06/20 1600 08/06/20 1630  BP: 129/75 114/83 113/64 130/84  Pulse: (!) 122 (!) 121 (!) 112 (!) 118  Resp: 20 (!) 25 (!) 24 (!) 22  Temp:      TempSrc:      SpO2: 91% 95% 96% 100%  Weight:      Height:       General: WNWD woman in respiratory distress but able to speak in broken sentences. Eyes: PERRL, lids and conjunctivae normal ENMT: Mucous membranes are moist. Posterior pharynx  clear of any exudate or lesions.Normal dentition.  Neck: normal, supple, no masses, no thyromegaly Respiratory: Increased WOB with neck retractions, use of accessory muscles of respiration. Decreased breath sounds right base, prolonged expiratory phase, mild expiratory wheezing, no rales. No cough, no sputum production..  Cardiovascular: Regular rate tachycardia, no murmurs / rubs / gallops. No extremity edema. 2+ pedal pulses. No carotid bruits.  Abdomen: no tenderness, no masses palpated. No hepatosplenomegaly. Bowel sounds positive.  Musculoskeletal: no clubbing / cyanosis. No joint deformity upper and lower extremities. Good ROM, no contractures. Normal muscle tone.  Skin: no rashes, lesions, ulcers. No induration Neurologic: CN 2-12 grossly intact. Strength 4/5 in all 4.  Psychiatric: Normal judgment and insight. Alert and oriented x 3. Normal mood.     Labs on Admission: I have personally reviewed following labs and imaging studies  CBC: Recent Labs  Lab 08/06/20 1404  WBC 9.7  NEUTROABS 8.2*  HGB 12.8  HCT 38.2  MCV 85.7  PLT 295   Basic Metabolic Panel: Recent Labs  Lab 08/06/20 1404  NA 136  K 4.0  CL 99  CO2 25  GLUCOSE 96  BUN 17  CREATININE 0.70  CALCIUM 8.8*   GFR: Estimated Creatinine Clearance: 60.4 mL/min (by C-G formula based on SCr of 0.7 mg/dL). Liver Function Tests: Recent Labs  Lab 08/06/20 1404  AST 33  ALT 32  ALKPHOS 233*  BILITOT 0.3  PROT 7.0  ALBUMIN 3.7   No results for input(s): LIPASE, AMYLASE in the last 168 hours. No results for input(s): AMMONIA in the last 168 hours. Coagulation Profile: No results for input(s): INR, PROTIME in the last 168 hours. Cardiac Enzymes: No results for input(s): CKTOTAL, CKMB, CKMBINDEX, TROPONINI in the last 168 hours. BNP (last 3 results) No results for input(s): PROBNP in the last 8760 hours. HbA1C: No results for input(s): HGBA1C in the last 72 hours. CBG: No results for input(s): GLUCAP in  the last 168 hours. Lipid Profile: No results for input(s): CHOL, HDL, LDLCALC, TRIG, CHOLHDL, LDLDIRECT in the last 72 hours. Thyroid Function Tests: No results for input(s): TSH, T4TOTAL, FREET4, T3FREE, THYROIDAB in the last 72 hours.  Anemia Panel: No results for input(s): VITAMINB12, FOLATE, FERRITIN, TIBC, IRON, RETICCTPCT in the last 72 hours. Urine analysis:    Component Value Date/Time   COLORURINE YELLOW 05/05/2018 [place] 05/05/2018 1539   LABSPEC 1.006 05/05/2018 1539   PHURINE 6.0 05/05/2018 1539   GLUCOSEU NEGATIVE 05/05/2018 1539   HGBUR NEGATIVE 05/05/2018 1539   BILIRUBINUR NEGATIVE 05/05/2018 [place] 05/05/2018 1539   PROTEINUR NEGATIVE 05/05/2018 1539   UROBILINOGEN 0.2 03/31/2014 1406   NITRITE NEGATIVE 05/05/2018 1539   LEUKOCYTESUR NEGATIVE 05/05/2018 1539    Radiological Exams on Admission: CT Angio Chest PE W and/or Wo Contrast  Result Date: 08/06/2020 CLINICAL DATA:  Evaluate for acute pulmonary embolus. History of lung cancer. EXAM: CT ANGIOGRAPHY CHEST WITH CONTRAST TECHNIQUE: Multidetector CT imaging of the chest was performed using the standard protocol during bolus administration of intravenous contrast. Multiplanar CT image reconstructions and MIPs were obtained to evaluate the vascular anatomy. CONTRAST:  181mL OMNIPAQUE IOHEXOL 350 MG/ML SOLN COMPARISON:  06/05/2020. FINDINGS: Cardiovascular: Satisfactory opacification of the pulmonary arteries to the segmental level. No evidence of pulmonary embolism. Aortic atherosclerosis. No aneurysm. Increase caliber of the main pulmonary artery measures 3.2 cm in may reflect underlying PA hypertension. Coronary artery calcifications noted. Mediastinum/Nodes: Normal appearance of the thyroid gland. The trachea appears patent and is midline. Normal appearance of the esophagus. No enlarged axillary, mediastinal, or hilar lymph nodes. Lungs/Pleura: No pleural effusion. Moderate to  advanced changes of centrilobular and paraseptal emphysema. -Right upper lobe lung mass measures 3.1 x 2.7 by 4.3 cm (volume = 19 cm^3), image 33/6 and image 92/7. Previously this measured 5.2 x 3.1 by 4.8 cm (volume = 41 cm^3). Surrounding scarring and architectural distortion is again noted. -The previously mentioned posterior right upper lobe satellite nodule measures 1.1 cm, image 32/6. Previously 1.2 cm. -Right midlung perihilar masslike architectural distortion and fibrosis is again noted compatible with post treatment changes. Unchanged appearance of complete atelectasis of the right middle lobe. -New patchy ground-glass and airspace densities within the superior segment of right lower lobe posterior to the right hilum is noted, image 91/6. This may reflect changes due to external beam radiation versus superimposed inflammatory or infectious process. -Within the superior segment of the left lower lobe there is a 6 mm nodule, image 71/6. This is new when compared with the previous exam. Upper Abdomen: No acute abnormality. Musculoskeletal: No chest wall abnormality. No acute or significant osseous findings. Review of the MIP images confirms the above findings. IMPRESSION: 1. No evidence for acute pulmonary embolus. 2. Decrease in size of right upper lobe lung mass with posterior satellite nodule. 3. Stable appearance of post treatment changes within the right lung. 4. New patchy ground-glass and airspace densities within the superior segment of right lower lobe posterior to the right hilum is identified. This may reflect changes due to external beam radiation versus superimposed inflammatory or infectious process. 5. New 6 mm nodule within the superior segment of left lower lobe. Etiology indeterminate. Attention in this nodule on subsequent surveillance imaging is advised. 6. Emphysema and aortic atherosclerosis. Aortic Atherosclerosis (ICD10-I70.0) and Emphysema (ICD10-J43.9). Electronically Signed   By:  Kerby Moors M.D.   On: 08/06/2020 16:18   DG Chest Portable 1 View  Result Date: 08/06/2020 CLINICAL DATA:  Shortness of breath. EXAM: PORTABLE CHEST 1 VIEW COMPARISON:  November 05, 2019. FINDINGS: The heart size and mediastinal contours are within normal limits. No pneumothorax or pleural effusion is noted. Stable  bibasilar scarring is noted. Stable right perihilar opacity is noted concerning for chronic atelectasis. No definite acute abnormality is noted. The visualized skeletal structures are unremarkable. IMPRESSION: Stable chronic findings as described above. No definite acute abnormality is noted. Aortic Atherosclerosis (ICD10-I70.0). Electronically Signed   By: Marijo Conception M.D.   On: 08/06/2020 14:33    EKG: Independently reviewed. Sinus tachycardia, bigeminy, no acute changes  Assessment/Plan Active Problems:   COPD with acute exacerbation (HCC)   CAP (community acquired pneumonia)   HTN (hypertension)   Asthma, chronic   GERD (gastroesophageal reflux disease)    1. Pulmonary - patient with exacerbation of COPD/Asthma with increased SOB and wheezing. Maintaining adequate oxygenation on 3 liters but is working very hard to breath. She was seen by pulmonary at [facility] clinic and tested positive for OSA. CPAP prescribed but never delivered. Plan Step-down unit admit  Continue home inhalers and nebulizer treatment  Continue Oxygen at 3L per [place]  CPAP qHS  Systemic steroids: solumedrol 125 mg IV then 40 mg q6  For desaturation or overt fatigue will go to BiPAP  Patient is willing to have mechanical ventilatory support if needed.  2. CAP - hazy infiltrate on CTA in superior segment RLL. She is receiving immunotherapy with last infusion 07/26/20 and is a compromised host. At risk for CAP but w/o fever, sputum, or rales. Received Levaquin 750 mg IV in ED Plan Abx coverage with rocephin and azithromycin  Procalcitonin ordered.  3. Lung Ca - NSCCa RUL with recurrence. Dr. Earlie Server is  treating oncologist. Last infusion Opdivo 07/26/20.  4. HTN - stable. Continue home meds.  5. GERD - continue PPI  6. Code status - full code including mechanical ventilation if needed.     DVT prophylaxis: lovenox  Code Status: full code  Family Communication: spoke with Jonetta Osgood, daughter, explained working Dx of COPD exacerbation and possibly PNA and Tx plan. Informed her that her mom is will to be ventilated if necessary. She agrees with full code status.  Disposition Plan: TBD  Consults called: none (with names) Admission status: inpatient-stepdown    Adella Hare MD [facility] Pager [phone]  If 7PM-7AM, please contact night-coverage [email or web address] Password [facility]  08/06/2020, 5:37 PM

## 2020-08-06 NOTE — ED Notes (Signed)
Pt did not sign MSE waiver due to provider being available at bedside prior to triage and upon pt's arrival to ED room.

## 2020-08-06 NOTE — ED Notes (Signed)
Put pt on monitor

## 2020-08-06 NOTE — ED Triage Notes (Signed)
Pt brought in by [facility] with c/o lower chest pain and cough that started last night. Pt reports the pain is worse with inspiration and feels like a weight on her chest. EMS gave 2 Nitro and ASA 324mg  with some relief in pain.

## 2020-08-06 NOTE — ED Notes (Signed)
Hospitalist in room with patient at this time.

## 2020-08-06 NOTE — ED Notes (Signed)
Hospitalist aware of VS. States that she possibly needs to be placed on Bi-pap.

## 2020-08-06 NOTE — ED Provider Notes (Signed)
Emergency Department Provider Note   I have reviewed the triage vital signs and the nursing notes.   HISTORY  Chief Complaint Chest Pain   HPI Courtney Zuniga is a 70 y.o. female with PMH reviewed below including COPD on 3L [place] O2 at home, CAD, and recurrent non-small cell lung CA on chemo/radiation currently presents to the ED by EMS with CP since 9 PM last night.  Patient is having substernal, lower chest discomfort without radiation.  Pain is worse with coughing which she does frequently at baseline.  She is feeling slightly more short of breath than normal but states that with her COPD she frequently feels winded.  EMS arrived to find the patient with some increased work of breathing.  They increased her to 4 L nasal cannula oxygen and gave 2 nitroglycerin in route with minimal improvement in chest pain symptoms.  She was not diaphoretic or having vomiting.  Patient denies any abdominal or back pain.  No radiation of symptoms or other modifying factors.   Past Medical History:  Diagnosis Date  . Anemia    as a young woman  . Arthritis    osteoartritis  . Asthma   . Brain tumor (benign) ([place]) 2005 [facility]   Benign  . Chronic headaches   . Chronic hip pain   . Chronic pain   . COPD (chronic obstructive pulmonary disease) ([place])   . Coronary artery disease   . Depression   . Depression 05/15/2016  . Encounter for antineoplastic chemotherapy 01/10/2016  . GERD (gastroesophageal reflux disease)   . Hypertension   . Lung cancer ([place]) dx'd 01/2016   currently on chemo and radiation   . NSTEMI (non-ST elevated myocardial infarction) ([place]) yrs ago  . On home O2    qhs 2 liters at hs and prn  . Pneumonia last time 2 yrs ago  . Shortness of breath dyspnea    with activity    Patient Active Problem List   Diagnosis Date Noted  . COPD exacerbation ([place]) 11/05/2019  . Chronic nonintractable headache   . Acute on chronic respiratory failure with hypoxia and hypercapnia ([place])  06/16/2018  . Influenza A 06/16/2018  . Severe protein-calorie malnutrition ([place]) 06/16/2018  . GERD (gastroesophageal reflux disease) 06/16/2018  . PNA (pneumonia) 05/05/2018  . Sepsis ([place]) 05/05/2018  . Chronic respiratory failure ([place]) 05/05/2018  . Encounter for antineoplastic immunotherapy 04/23/2017  . Depression 05/15/2016  . Adenocarcinoma of right lung, stage 3 ([place]) 01/10/2016  . Encounter for antineoplastic chemotherapy 01/10/2016  . Lung mass 01/03/2016  . Arthritis   . Subepithelial gastric mass   . Mucosal abnormality of stomach   . Abdominal pain 08/08/2015  . Abnormal CT of the abdomen 08/08/2015  . Influenza with pneumonia 06/27/2014  . Hypoxia 06/26/2014  . COPD with acute exacerbation ([place]) 06/26/2014  . CAP (community acquired pneumonia)   . Chest pain, rule out acute myocardial infarction   . Asthma, chronic   . Chest pain 02/16/2014  . HTN (hypertension) 02/16/2014  . DDD (degenerative disc disease), lumbar 02/04/2011  . Lumbar herniated disc 01/09/2011    Past Surgical History:  Procedure Laterality Date  . CHOLECYSTECTOMY    . COLONOSCOPY  2015   Results requested from [facility]  . COLONOSCOPY    . ESOPHAGOGASTRODUODENOSCOPY N/A 08/14/2015   Procedure: ESOPHAGOGASTRODUODENOSCOPY (EGD);  Surgeon: Daneil Dolin, MD;  [facility];  Service: Endoscopy;  Laterality: N/A;  215   . ESOPHAGOGASTRODUODENOSCOPY (EGD) WITH  PROPOFOL N/A 09/13/2015   Procedure: ESOPHAGOGASTRODUODENOSCOPY (EGD) WITH PROPOFOL;  Surgeon: Milus Banister, MD;  Location: [facility];  Service: Endoscopy;  Laterality: N/A;  . EUS N/A 03/12/2017   Procedure: UPPER ENDOSCOPIC ULTRASOUND (EUS) RADIAL;  Surgeon: Milus Banister, MD;  Location: [facility];  Service: Endoscopy;  Laterality: N/A;  . TUMOR REMOVAL  2005   Benign  . UPPER ESOPHAGEAL ENDOSCOPIC ULTRASOUND (EUS)  09/13/2015   Procedure: UPPER ESOPHAGEAL ENDOSCOPIC ULTRASOUND (EUS);  Surgeon: Milus Banister, MD;  Location: [facility];  Service: Endoscopy;;  . VIDEO BRONCHOSCOPY WITH ENDOBRONCHIAL NAVIGATION N/A 12/31/2015   Procedure: VIDEO BRONCHOSCOPY WITH ENDOBRONCHIAL NAVIGATION;  Surgeon: Melrose Nakayama, MD;  Location: [facility];  Service: Thoracic;  Laterality: N/A;  . VIDEO BRONCHOSCOPY WITH ENDOBRONCHIAL ULTRASOUND N/A 11/08/2015   Procedure: VIDEO BRONCHOSCOPY WITH ENDOBRONCHIAL ULTRASOUND;  Surgeon: Ivin Poot, MD;  Location: [facility];  Service: Thoracic;  Laterality: N/A;    Allergies Patient has no known allergies.  Family History  Problem Relation Age of Onset  . Ovarian cancer Mother   . Lung cancer Father   . Lung cancer Brother   . Lung cancer Brother   . Prostate cancer Brother   . Brain cancer Sister        Half sister    Social History Social History   Tobacco Use  . Smoking status: Former Smoker    Packs/day: 1.00    Years: 38.00    Pack years: 38.00    Types: Cigarettes    Quit date: 07/26/2018    Years since quitting: 2.0  . Smokeless tobacco: Never Used  . Tobacco comment: Pt has asked doctor for med to help   Vaping Use  . Vaping Use: Never used  Substance Use Topics  . Alcohol use: No    Alcohol/week: 0.0 standard drinks  . Drug use: No    Review of Systems  Constitutional: No fever/chills Eyes: No visual changes. ENT: No sore throat. Cardiovascular: Positive chest pain. Respiratory: Positive shortness of breath. Gastrointestinal: No abdominal pain.  No nausea, no vomiting.  No diarrhea.  No constipation. Genitourinary: Negative for dysuria. Musculoskeletal: Negative for back pain. Skin: Negative for rash. Neurological: Negative for headaches, focal weakness or numbness.  10-point ROS otherwise negative.  ____________________________________________   PHYSICAL EXAM:  VITAL SIGNS: ED Triage Vitals  Enc Vitals Group     BP 08/06/20 1412 128/79     Pulse Rate 08/06/20 1412 (!) 127     Resp 08/06/20 1412 20     Temp  08/06/20 1412 98.9 F (37.2 C)     Temp Source 08/06/20 1412 Oral     SpO2 08/06/20 1406 100 %     Weight 08/06/20 1407 141 lb (64 kg)     Height 08/06/20 1407 5\' 6"  (1.676 m)   Constitutional: Alert and oriented. Patient with increased WOB but able to participate with history in 3-4 word sentences.  Eyes: Conjunctivae are normal.  Head: Atraumatic. Nose: No congestion/rhinnorhea. Mouth/Throat: Mucous membranes are moist.  Neck: No stridor.   Cardiovascular: Normal rate, regular rhythm. Good peripheral circulation. Grossly normal heart sounds.   Respiratory: Increased respiratory effort.  No retractions. Lungs diminished at the bases with some mild end-expiratory wheezing. Mildly decreased air movement.  Gastrointestinal: Soft and nontender. No distention.  Musculoskeletal: No lower extremity tenderness nor edema. No gross deformities of extremities. Neurologic:  Normal speech and language. No gross focal neurologic deficits are appreciated.  Skin:  Skin is  warm, dry and intact. No rash noted.  ____________________________________________   LABS (all labs ordered are listed, but only abnormal results are displayed)  Labs Reviewed  COMPREHENSIVE METABOLIC PANEL - Abnormal; Notable for the following components:      Result Value   Calcium 8.8 (*)    Alkaline Phosphatase 233 (*)    All other components within normal limits  CBC WITH DIFFERENTIAL/PLATELET - Abnormal; Notable for the following components:   Neutro Abs 8.2 (*)    All other components within normal limits  CBC WITH DIFFERENTIAL/PLATELET - Abnormal; Notable for the following components:   Neutro Abs 8.7 (*)    Lymphs Abs 0.6 (*)    All other components within normal limits  RESP PANEL BY RT-PCR (FLU A&B, COVID) ARPGX2  MRSA PCR SCREENING  BRAIN NATRIURETIC PEPTIDE  PROCALCITONIN  LEGIONELLA PNEUMOPHILA SEROGP 1 UR AG  STREP PNEUMONIAE URINARY ANTIGEN  TROPONIN I (HIGH SENSITIVITY)  TROPONIN I (HIGH SENSITIVITY)    ____________________________________________  EKG   EKG Interpretation  Date/Time:  Monday August 06 2020 14:05:01 EDT Ventricular Rate:  130 PR Interval:  149 QRS Duration: 73 QT Interval:  307 QTC Calculation: 416 R Axis:   -6 Text Interpretation: Sinus tachycardia Ventricular bigeminy Consider right atrial enlargement Anterior infarct, old Confirmed by Nanda Quinton [phone]) on 08/06/2020 2:12:35 PM       ____________________________________________  RADIOLOGY  CT Angio Chest PE W and/or Wo Contrast  Result Date: 08/06/2020 CLINICAL DATA:  Evaluate for acute pulmonary embolus. History of lung cancer. EXAM: CT ANGIOGRAPHY CHEST WITH CONTRAST TECHNIQUE: Multidetector CT imaging of the chest was performed using the standard protocol during bolus administration of intravenous contrast. Multiplanar CT image reconstructions and MIPs were obtained to evaluate the vascular anatomy. CONTRAST:  196mL OMNIPAQUE IOHEXOL 350 MG/ML SOLN COMPARISON:  06/05/2020. FINDINGS: Cardiovascular: Satisfactory opacification of the pulmonary arteries to the segmental level. No evidence of pulmonary embolism. Aortic atherosclerosis. No aneurysm. Increase caliber of the main pulmonary artery measures 3.2 cm in may reflect underlying PA hypertension. Coronary artery calcifications noted. Mediastinum/Nodes: Normal appearance of the thyroid gland. The trachea appears patent and is midline. Normal appearance of the esophagus. No enlarged axillary, mediastinal, or hilar lymph nodes. Lungs/Pleura: No pleural effusion. Moderate to advanced changes of centrilobular and paraseptal emphysema. -Right upper lobe lung mass measures 3.1 x 2.7 by 4.3 cm (volume = 19 cm^3), image 33/6 and image 92/7. Previously this measured 5.2 x 3.1 by 4.8 cm (volume = 41 cm^3). Surrounding scarring and architectural distortion is again noted. -The previously mentioned posterior right upper lobe satellite nodule measures 1.1 cm, image 32/6.  Previously 1.2 cm. -Right midlung perihilar masslike architectural distortion and fibrosis is again noted compatible with post treatment changes. Unchanged appearance of complete atelectasis of the right middle lobe. -New patchy ground-glass and airspace densities within the superior segment of right lower lobe posterior to the right hilum is noted, image 91/6. This may reflect changes due to external beam radiation versus superimposed inflammatory or infectious process. -Within the superior segment of the left lower lobe there is a 6 mm nodule, image 71/6. This is new when compared with the previous exam. Upper Abdomen: No acute abnormality. Musculoskeletal: No chest wall abnormality. No acute or significant osseous findings. Review of the MIP images confirms the above findings. IMPRESSION: 1. No evidence for acute pulmonary embolus. 2. Decrease in size of right upper lobe lung mass with posterior satellite nodule. 3. Stable appearance of post treatment changes within the right  lung. 4. New patchy ground-glass and airspace densities within the superior segment of right lower lobe posterior to the right hilum is identified. This may reflect changes due to external beam radiation versus superimposed inflammatory or infectious process. 5. New 6 mm nodule within the superior segment of left lower lobe. Etiology indeterminate. Attention in this nodule on subsequent surveillance imaging is advised. 6. Emphysema and aortic atherosclerosis. Aortic Atherosclerosis (ICD10-I70.0) and Emphysema (ICD10-J43.9). Electronically Signed   By: Kerby Moors M.D.   On: 08/06/2020 16:18   DG Chest Portable 1 View  Result Date: 08/06/2020 CLINICAL DATA:  Shortness of breath. EXAM: PORTABLE CHEST 1 VIEW COMPARISON:  November 05, 2019. FINDINGS: The heart size and mediastinal contours are within normal limits. No pneumothorax or pleural effusion is noted. Stable bibasilar scarring is noted. Stable right perihilar opacity is noted  concerning for chronic atelectasis. No definite acute abnormality is noted. The visualized skeletal structures are unremarkable. IMPRESSION: Stable chronic findings as described above. No definite acute abnormality is noted. Aortic Atherosclerosis (ICD10-I70.0). Electronically Signed   By: Marijo Conception M.D.   On: 08/06/2020 14:33    ____________________________________________   PROCEDURES  Procedure(s) performed:   Procedures  CRITICAL CARE Performed by: Margette Fast Total critical care time: 35 minutes Critical care time was exclusive of separately billable procedures and treating other patients. Critical care was necessary to treat or prevent imminent or life-threatening deterioration. Critical care was time spent personally by me on the following activities: development of treatment plan with patient and/or surrogate as well as nursing, discussions with consultants, evaluation of patient's response to treatment, examination of patient, obtaining history from patient or surrogate, ordering and performing treatments and interventions, ordering and review of laboratory studies, ordering and review of radiographic studies, pulse oximetry and re-evaluation of patient's condition.  Nanda Quinton, MD Emergency Medicine  ____________________________________________   INITIAL IMPRESSION / ASSESSMENT AND PLAN / ED COURSE  Pertinent labs & imaging results that were available during my care of the patient were reviewed by me and considered in my medical decision making (see chart for details).   Patient presents emergency department with chest pain since last night.  She does have some increased work of breathing but noted history of COPD requiring 3 L nasal cannula oxygen at home.  She is on slightly more oxygen than normal for her.  She also has active lung cancer on chemotherapy and radiation.  She is afebrile but is tachycardic.  Gave albuterol inhaler on arrival with some improvement in  symptoms subjectively.  Pain in the chest is worse with coughing and so ACS/PE is lower on my differential but patient certainly high risk for especially PE.  EKG is similar to prior but does show frequent PVCs.  Plan for initial screening blood work, COVID swab, and consideration of CTA of the chest versus COPD mgmt pending labs.   Patient improved on reassessment with albuterol. Plan for CTA with patient being high risk for PE and relatively stable CXR. Care transferred to Dr. Roderic Palau.  ____________________________________________  FINAL CLINICAL IMPRESSION(S) / ED DIAGNOSES  Final diagnoses:  COPD exacerbation ([place])     MEDICATIONS GIVEN DURING THIS VISIT:  Medications  acetaminophen (TYLENOL) tablet 650 mg (650 mg Oral Given 08/06/20 1824)  meloxicam (MOBIC) tablet 15 mg (has no administration in time range)  oxyCODONE-acetaminophen (PERCOCET/ROXICET) 5-325 MG per tablet 1 tablet (has no administration in time range)  atorvastatin (LIPITOR) tablet 10 mg (has no administration in time range)  metoprolol  tartrate (LOPRESSOR) tablet 25 mg (25 mg Oral Given 08/06/20 2203)  DULoxetine (CYMBALTA) DR capsule 30 mg (has no administration in time range)  pantoprazole (PROTONIX) EC tablet 40 mg (has no administration in time range)  cyclobenzaprine (FLEXERIL) tablet 10 mg (10 mg Oral Given 08/06/20 2203)  topiramate (TOPAMAX) tablet 25 mg (25 mg Oral Given 08/06/20 2203)  feeding supplement (ENSURE ENLIVE / ENSURE PLUS) liquid 237 mL (has no administration in time range)  albuterol (PROVENTIL) (2.5 MG/3ML) 0.083% nebulizer solution 3 mL (has no administration in time range)  fluticasone furoate-vilanterol (BREO ELLIPTA) 100-25 MCG/INH 1 puff (1 puff Inhalation Not Given 08/06/20 1808)  dextromethorphan-guaiFENesin ([place] DM) 30-600 MG per 12 hr tablet 1 tablet (1 tablet Oral Given 08/06/20 2203)  ipratropium-albuterol (DUONEB) 0.5-2.5 (3) MG/3ML nebulizer solution 3 mL (3 mLs Nebulization Given  08/07/20 0006)  enoxaparin (LOVENOX) injection 40 mg (40 mg Subcutaneous Given 08/06/20 1825)  0.45 % sodium chloride infusion ( Intravenous New Bag/Given 08/06/20 2212)  azithromycin (ZITHROMAX) tablet 500 mg (has no administration in time range)  cefTRIAXone (ROCEPHIN) 2 g in sodium chloride 0.9 % 100 mL IVPB (has no administration in time range)  methylPREDNISolone sodium succinate (SOLU-MEDROL) 40 mg/mL injection 40 mg (40 mg Intravenous Given 08/07/20 0549)  ipratropium-albuterol (DUONEB) 0.5-2.5 (3) MG/3ML nebulizer solution 3 mL (has no administration in time range)  Chlorhexidine Gluconate Cloth 2 % PADS 6 each (has no administration in time range)  albuterol (VENTOLIN HFA) 108 (90 Base) MCG/ACT inhaler 2 puff (2 puffs Inhalation Given 08/06/20 1533)  iohexol (OMNIPAQUE) 350 MG/ML injection 100 mL (100 mLs Intravenous Contrast Given 08/06/20 1543)  levofloxacin (LEVAQUIN) IVPB 750 mg (0 mg Intravenous Stopped 08/06/20 2004)  methylPREDNISolone sodium succinate (SOLU-MEDROL) 125 mg/2 mL injection 125 mg (125 mg Intravenous Given 08/06/20 2203)    Note:  This document was prepared using Dragon voice recognition software and may include unintentional dictation errors.  Nanda Quinton, MD, [facility] Emergency Medicine    Yvonda Fouty, [facility], MD 08/07/20 [phone]

## 2020-08-07 DIAGNOSIS — A419 Sepsis, unspecified organism: Principal | ICD-10-CM

## 2020-08-07 DIAGNOSIS — J9621 Acute and chronic respiratory failure with hypoxia: Secondary | ICD-10-CM

## 2020-08-07 DIAGNOSIS — J9622 Acute and chronic respiratory failure with hypercapnia: Secondary | ICD-10-CM

## 2020-08-07 LAB — CBC WITH DIFFERENTIAL/PLATELET
Abs Immature Granulocytes: 0.05 10*3/uL (ref 0.00–0.07)
Basophils Absolute: 0 10*3/uL (ref 0.0–0.1)
Basophils Relative: 0 %
Eosinophils Absolute: 0 10*3/uL (ref 0.0–0.5)
Eosinophils Relative: 0 %
HCT: 39 % (ref 36.0–46.0)
Hemoglobin: 13.4 g/dL (ref 12.0–15.0)
Immature Granulocytes: 1 %
Lymphocytes Relative: 6 %
Lymphs Abs: 0.6 10*3/uL — ABNORMAL LOW (ref 0.7–4.0)
MCH: 29.3 pg (ref 26.0–34.0)
MCHC: 34.4 g/dL (ref 30.0–36.0)
MCV: 85.2 fL (ref 80.0–100.0)
Monocytes Absolute: 0.2 10*3/uL (ref 0.1–1.0)
Monocytes Relative: 2 %
Neutro Abs: 8.7 10*3/uL — ABNORMAL HIGH (ref 1.7–7.7)
Neutrophils Relative %: 91 %
Platelets: 188 10*3/uL (ref 150–400)
RBC: 4.58 MIL/uL (ref 3.87–5.11)
RDW: 15.3 % (ref 11.5–15.5)
WBC: 9.5 10*3/uL (ref 4.0–10.5)
nRBC: 0 % (ref 0.0–0.2)

## 2020-08-07 MED ORDER — METHYLPREDNISOLONE SODIUM SUCC 40 MG IJ SOLR
40.0000 mg | Freq: Three times a day (TID) | INTRAMUSCULAR | Status: DC
  Administered 2020-08-07 – 2020-08-08 (×4): 40 mg via INTRAVENOUS
  Filled 2020-08-07 (×4): qty 1

## 2020-08-07 MED ORDER — ENSURE ENLIVE PO LIQD
237.0000 mL | Freq: Two times a day (BID) | ORAL | Status: DC
  Administered 2020-08-08 – 2020-08-09 (×4): 237 mL via ORAL

## 2020-08-07 MED ORDER — CHLORHEXIDINE GLUCONATE CLOTH 2 % EX PADS
6.0000 | MEDICATED_PAD | Freq: Every day | CUTANEOUS | Status: DC
  Administered 2020-08-07 – 2020-08-09 (×3): 6 via TOPICAL

## 2020-08-07 MED ORDER — ADULT MULTIVITAMIN W/MINERALS CH
1.0000 | ORAL_TABLET | Freq: Every day | ORAL | Status: DC
  Administered 2020-08-07 – 2020-08-09 (×3): 1 via ORAL
  Filled 2020-08-07 (×3): qty 1

## 2020-08-07 MED ORDER — GUAIFENESIN ER 600 MG PO TB12
600.0000 mg | ORAL_TABLET | Freq: Two times a day (BID) | ORAL | Status: DC
  Administered 2020-08-07 – 2020-08-09 (×5): 600 mg via ORAL
  Filled 2020-08-07 (×5): qty 1

## 2020-08-07 NOTE — Progress Notes (Signed)
Patient Demographics:    Courtney Zuniga, is a 70 y.o. female, DOB - 1950-10-27, SMO:[id number]  Admit date - 08/06/2020   Admitting Physician Neena Rhymes, MD  Outpatient Primary MD for the patient is Barry Dienes, NP  LOS - 1   Chief Complaint  Patient presents with  . Chest Pain        Subjective:    Courtney Zuniga today has no fevers, no emesis,  No chest pain,   -Dyspnea on exertion persist -Oxygen requirement continues to be above baseline =-Cough and congestion persist  Assessment  & Plan :    Principal Problem:   Sepsis due to CAP Active Problems:   CAP (community acquired pneumonia)   HTN (hypertension)   Asthma, chronic   COPD with acute exacerbation (HCC)   Adenocarcinoma of right lung, stage 3 (HCC)   Acute on chronic respiratory failure with hypoxia and hypercapnia (HCC)   GERD (gastroesophageal reflux disease)  Brief Summary:- 70 y.o. female with medical history significant of NSCCa Lung 2017 with recurrence now on immunotherapy with last infusion 07/26/20, Asthma/COPD, HTN, chronic pain who is usually on home oxygen at 3L/[place] admitted with acute on chronic hypoxic respiratory failure and right lower lobe pneumonia  A/p  1)Sepsis due to right lower lobe pneumonia/CAP----POA-- -patient with CAP Superimposed on severe emphysema and recurrent lung cancer --CTA chest shows New patchy ground-glass and airspace densities within the superior segment of right lower lobe posterior to the right hilum  -No fever or leukocytosis noted, however patient is immunocompromised due to recurrent lung cancer on immunotherapy -Continue IV Rocephin and azithromycin  -Continue mucolytics and bronchodilators -IV fluids as ordered -- 2) acute on chronic hypoxic respiratory failure--- PTA patient was on 2 to 3 L at home currently requiring 3 to 4 L to maintain O2 sats above 90% -Suspect this is  secondary to #1 above  3) recurrent lung cancer--NSCCa--patient sees Dr. Earlie Server the oncologist -Last dose of Opdivo immunotherapy was 07/26/2020  4) social/ethics--- patient is a full code without limitations to treatment  5)HTN-continue metoprolol 25 mg daily  --- Prophylaxis -Protonix for GI prophylaxis especially while on steroids and Lovenox for DVT prophylaxis especially given underlying malignancy with hypercoagulable state  Disposition/Need for in-Hospital Stay- patient unable to be discharged at this time due to -sepsis secondary to pneumonia requiring IV antibiotics and IV fluids*  Status is: Inpatient  Remains inpatient appropriate because:Please see disposition above  Disposition: The patient is from: Home              Anticipated d/c is to: Home              Anticipated d/c date is: 2 days              Patient currently is not medically stable to d/c. Barriers: Not Clinically Stable-   Code Status :  -  Code Status: Full Code   Family Communication:    (patient is alert, awake and coherent)   Consults  :  na  DVT Prophylaxis  :   - SCDs enoxaparin (LOVENOX) injection 40 mg Start: 08/06/20 1800    Lab Results  Component Value Date   PLT 188 08/07/2020    Inpatient Medications  Scheduled  Meds: . atorvastatin  10 mg Oral Daily  . azithromycin  500 mg Oral Q24H  . Chlorhexidine Gluconate Cloth  6 each Topical Daily  . cyclobenzaprine  10 mg Oral BID  . dextromethorphan-guaiFENesin  1 tablet Oral BID  . DULoxetine  30 mg Oral Daily  . enoxaparin (LOVENOX) injection  40 mg Subcutaneous Q24H  . [START ON 08/08/2020] feeding supplement  237 mL Oral BID BM  . fluticasone furoate-vilanterol  1 puff Inhalation Daily  . guaiFENesin  600 mg Oral BID  . ipratropium-albuterol  3 mL Nebulization TID  . meloxicam  15 mg Oral Daily  . methylPREDNISolone (SOLU-MEDROL) injection  40 mg Intravenous Q8H  . metoprolol tartrate  25 mg Oral BID  . multivitamin with minerals   1 tablet Oral Daily  . pantoprazole  40 mg Oral Daily  . topiramate  25 mg Oral BID   Continuous Infusions: . sodium chloride 50 mL/hr at 08/07/20 1508  . cefTRIAXone (ROCEPHIN)  IV     PRN Meds:.acetaminophen, albuterol, ipratropium-albuterol, oxyCODONE-acetaminophen    Anti-infectives (From admission, onward)   Start     Dose/Rate Route Frequency Ordered Stop   08/07/20 1800  azithromycin (ZITHROMAX) tablet 500 mg        500 mg Oral Every 24 hours 08/06/20 1751 08/11/20 1759   08/07/20 1800  cefTRIAXone (ROCEPHIN) 2 g in sodium chloride 0.9 % 100 mL IVPB       Note to Pharmacy: Received levaquin 750 mg IV in ED, start rocephin 08/07/20   2 g 200 mL/hr over 30 Minutes Intravenous Every 24 hours 08/06/20 1752 08/11/20 1759   08/06/20 1800  cefTRIAXone (ROCEPHIN) 2 g in sodium chloride 0.9 % 100 mL IVPB  Status:  Discontinued        2 g 200 mL/hr over 30 Minutes Intravenous Every 24 hours 08/06/20 1751 08/06/20 1752   08/06/20 1715  levofloxacin (LEVAQUIN) IVPB 750 mg        750 mg 100 mL/hr over 90 Minutes Intravenous  Once 08/06/20 1710 08/06/20 2004        Objective:   Vitals:   08/07/20 1400 08/07/20 1449 08/07/20 1500 08/07/20 1544  BP: 126/73  128/70   Pulse: 100  (!) 104 (!) 107  Resp: (!) 36  (!) 37 (!) 35  Temp:      TempSrc:      SpO2: 97% 99% 95%   Weight:      Height:        Wt Readings from Last 3 Encounters:  08/06/20 63.4 kg  07/26/20 64 kg  06/21/20 65.5 kg     Intake/Output Summary (Last 24 hours) at 08/07/2020 1654 Last data filed at 08/07/2020 1508 Gross per 24 hour  Intake 2233.5 ml  Output 700 ml  Net 1533.5 ml     Physical Exam  Gen:- Awake Alert, no conversational dyspnea at rest patient has dyspnea on exertion HEENT:- [place].AT, No sclera icterus Nose- [place] 4L/min Neck-Supple Neck,No JVD,.  Lungs-diminished breath sounds especially on the right with scattered rhonchi and wheezes  CV- S1, S2 normal, regular  Abd-  +ve B.Sounds, Abd Soft,  No tenderness,    Extremity/Skin:- No  edema, pedal pulses present  Psych-affect is appropriate, oriented x3 Neuro-no new focal deficits, no tremors   Data Review:   Micro Results Recent Results (from the past 240 hour(s))  MRSA PCR Screening     Status: None   Collection Time: 08/06/20  9:47 PM   Specimen: Nasopharyngeal  Result Value Ref Range Status   MRSA by PCR NEGATIVE NEGATIVE Final    Comment:        The GeneXpert MRSA Assay (FDA approved for NASAL specimens only), is one component of a comprehensive MRSA colonization surveillance program. It is not intended to diagnose MRSA infection nor to guide or monitor treatment for MRSA infections. Performed at [facility], [address], [place], [place] [address]   Resp Panel by RT-PCR (Flu A&B, Covid) Nasopharyngeal Swab     Status: None   Collection Time: 08/06/20  9:50 PM   Specimen: Nasopharyngeal Swab; Nasopharyngeal(NP) swabs in vial transport medium  Result Value Ref Range Status   SARS Coronavirus 2 by RT PCR NEGATIVE NEGATIVE Final    Comment: (NOTE) SARS-CoV-2 target nucleic acids are NOT DETECTED.  The SARS-CoV-2 RNA is generally detectable in upper respiratory specimens during the acute phase of infection. The lowest concentration of SARS-CoV-2 viral copies this assay can detect is 138 copies/mL. A negative result does not preclude SARS-Cov-2 infection and should not be used as the sole basis for treatment or other patient management decisions. A negative result may occur with  improper specimen collection/handling, submission of specimen other than nasopharyngeal swab, presence of viral mutation(s) within the areas targeted by this assay, and inadequate number of viral copies(<138 copies/mL). A negative result must be combined with clinical observations, patient history, and epidemiological information. The expected result is Negative.  Fact Sheet for Patients:   EntrepreneurPulse.com.au  Fact Sheet for Healthcare Providers:  IncredibleEmployment.be  This test is no t yet approved or cleared by the [place] FDA and  has been authorized for detection and/or diagnosis of SARS-CoV-2 by FDA under an Emergency Use Authorization (EUA). This EUA will remain  in effect (meaning this test can be used) for the duration of the COVID-19 declaration under Section 564(b)(1) of the Act, 21 U.S.C.section 360bbb-3(b)(1), unless the authorization is terminated  or revoked sooner.       Influenza A by PCR NEGATIVE NEGATIVE Final   Influenza B by PCR NEGATIVE NEGATIVE Final    Comment: (NOTE) The Xpert Xpress SARS-CoV-2/FLU/RSV plus assay is intended as an aid in the diagnosis of influenza from Nasopharyngeal swab specimens and should not be used as a sole basis for treatment. Nasal washings and aspirates are unacceptable for Xpert Xpress SARS-CoV-2/FLU/RSV testing.  Fact Sheet for Patients: EntrepreneurPulse.com.au  Fact Sheet for Healthcare Providers: IncredibleEmployment.be  This test is not yet approved or cleared by the [place] FDA and has been authorized for detection and/or diagnosis of SARS-CoV-2 by FDA under an Emergency Use Authorization (EUA). This EUA will remain in effect (meaning this test can be used) for the duration of the COVID-19 declaration under Section 564(b)(1) of the Act, 21 U.S.C. section 360bbb-3(b)(1), unless the authorization is terminated or revoked.  Performed at [facility], [address] [place], [place] [address]     Radiology Reports CT Angio Chest PE W and/or Wo Contrast  Result Date: 08/06/2020 CLINICAL DATA:  Evaluate for acute pulmonary embolus. History of lung cancer. EXAM: CT ANGIOGRAPHY CHEST WITH CONTRAST TECHNIQUE: Multidetector CT imaging of the chest was performed using the standard protocol during bolus administration of  intravenous contrast. Multiplanar CT image reconstructions and MIPs were obtained to evaluate the vascular anatomy. CONTRAST:  124mL OMNIPAQUE IOHEXOL 350 MG/ML SOLN COMPARISON:  06/05/2020. FINDINGS: Cardiovascular: Satisfactory opacification of the pulmonary arteries to the segmental level. No evidence of pulmonary embolism. Aortic atherosclerosis. No aneurysm. Increase caliber of  the main pulmonary artery measures 3.2 cm in may reflect underlying PA hypertension. Coronary artery calcifications noted. Mediastinum/Nodes: Normal appearance of the thyroid gland. The trachea appears patent and is midline. Normal appearance of the esophagus. No enlarged axillary, mediastinal, or hilar lymph nodes. Lungs/Pleura: No pleural effusion. Moderate to advanced changes of centrilobular and paraseptal emphysema. -Right upper lobe lung mass measures 3.1 x 2.7 by 4.3 cm (volume = 19 cm^3), image 33/6 and image 92/7. Previously this measured 5.2 x 3.1 by 4.8 cm (volume = 41 cm^3). Surrounding scarring and architectural distortion is again noted. -The previously mentioned posterior right upper lobe satellite nodule measures 1.1 cm, image 32/6. Previously 1.2 cm. -Right midlung perihilar masslike architectural distortion and fibrosis is again noted compatible with post treatment changes. Unchanged appearance of complete atelectasis of the right middle lobe. -New patchy ground-glass and airspace densities within the superior segment of right lower lobe posterior to the right hilum is noted, image 91/6. This may reflect changes due to external beam radiation versus superimposed inflammatory or infectious process. -Within the superior segment of the left lower lobe there is a 6 mm nodule, image 71/6. This is new when compared with the previous exam. Upper Abdomen: No acute abnormality. Musculoskeletal: No chest wall abnormality. No acute or significant osseous findings. Review of the MIP images confirms the above findings. IMPRESSION:  1. No evidence for acute pulmonary embolus. 2. Decrease in size of right upper lobe lung mass with posterior satellite nodule. 3. Stable appearance of post treatment changes within the right lung. 4. New patchy ground-glass and airspace densities within the superior segment of right lower lobe posterior to the right hilum is identified. This may reflect changes due to external beam radiation versus superimposed inflammatory or infectious process. 5. New 6 mm nodule within the superior segment of left lower lobe. Etiology indeterminate. Attention in this nodule on subsequent surveillance imaging is advised. 6. Emphysema and aortic atherosclerosis. Aortic Atherosclerosis (ICD10-I70.0) and Emphysema (ICD10-J43.9). Electronically Signed   By: Kerby Moors M.D.   On: 08/06/2020 16:18   DG Chest Portable 1 View  Result Date: 08/06/2020 CLINICAL DATA:  Shortness of breath. EXAM: PORTABLE CHEST 1 VIEW COMPARISON:  November 05, 2019. FINDINGS: The heart size and mediastinal contours are within normal limits. No pneumothorax or pleural effusion is noted. Stable bibasilar scarring is noted. Stable right perihilar opacity is noted concerning for chronic atelectasis. No definite acute abnormality is noted. The visualized skeletal structures are unremarkable. IMPRESSION: Stable chronic findings as described above. No definite acute abnormality is noted. Aortic Atherosclerosis (ICD10-I70.0). Electronically Signed   By: Marijo Conception M.D.   On: 08/06/2020 14:33     CBC Recent Labs  Lab 08/06/20 1404 08/07/20 0636  WBC 9.7 9.5  HGB 12.8 13.4  HCT 38.2 39.0  PLT 186 188  MCV 85.7 85.2  MCH 28.7 29.3  MCHC 33.5 34.4  RDW 15.4 15.3  LYMPHSABS 0.7 0.6*  MONOABS 0.8 0.2  EOSABS 0.0 0.0  BASOSABS 0.0 0.0    Chemistries  Recent Labs  Lab 08/06/20 1404  NA 136  K 4.0  CL 99  CO2 25  GLUCOSE 96  BUN 17  CREATININE 0.70  CALCIUM 8.8*  AST 33  ALT 32  ALKPHOS 233*  BILITOT 0.3    ------------------------------------------------------------------------------------------------------------------ No results for input(s): CHOL, HDL, LDLCALC, TRIG, CHOLHDL, LDLDIRECT in the last 72 hours.  Lab Results  Component Value Date   HGBA1C 5.7 (H) 02/16/2014   ------------------------------------------------------------------------------------------------------------------ No results  for input(s): TSH, T4TOTAL, T3FREE, THYROIDAB in the last 72 hours.  Invalid input(s): FREET3 ------------------------------------------------------------------------------------------------------------------ No results for input(s): VITAMINB12, FOLATE, FERRITIN, TIBC, IRON, RETICCTPCT in the last 72 hours.  Coagulation profile No results for input(s): INR, PROTIME in the last 168 hours.  No results for input(s): DDIMER in the last 72 hours.  Cardiac Enzymes No results for input(s): CKMB, TROPONINI, MYOGLOBIN in the last 168 hours.  Invalid input(s): CK ------------------------------------------------------------------------------------------------------------------    Component Value Date/Time   BNP 47.0 08/06/2020 1404     Marjoria Mancillas M.D on 08/07/2020 at 4:54 PM  Go to [email or web address] - for contact info  [facility] - Office  [phone]

## 2020-08-07 NOTE — Progress Notes (Signed)
Initial Nutrition Assessment  DOCUMENTATION CODES:   Not applicable  INTERVENTION:   -Ensure Enlive po BID, each supplement provides 350 kcal and 20 grams of protein -MVI with minerals daily  NUTRITION DIAGNOSIS:   Increased nutrient needs related to chronic illness (COPD) as evidenced by estimated needs.  GOAL:   Patient will meet greater than or equal to 90% of their needs  MONITOR:   PO intake,Supplement acceptance,Labs,Weight trends,Skin,I & O's  REASON FOR ASSESSMENT:   Malnutrition Screening Tool    ASSESSMENT:   Courtney Zuniga is a 70 y.o. female with medical history significant of NSCCa Lung 2017 with recurrence now on immunotherapy with last infusion 07/26/20, Asthma/COPD, HTN, chronic pain who is usually on home oxygen at 3L/[place]. Starting yesterday she had cough, anterior chest pain described as a weight on her chest and increased SOB. EMS activated and patient transported to [facility] for evaluation.  Pt admitted with COPD exacerbation.   Reviewed I/O's: +789 ml x 24 hours  UOP: 700 ml x 24 hours  Pt unavailable at time of visit. Attempted to speak with pt via call to hospital room phone, however, unable to reach. RD unable to obtain further nutrition-related history or complete nutrition-focused physical exam at this time.   Per chart review, pt has history of lung cancer, last immunotherapy treatment on 07/26/20.  Pt with good appetite; noted meal completion 75-100%.   Reviewed wt hx; wt has been stable over the past 10 months.   Medications reviewed and include solu-medrol.   Labs reviewed.   Diet Order:   Diet Order            Diet regular Room service appropriate? Yes; Fluid consistency: Thin  Diet effective now                 EDUCATION NEEDS:   No education needs have been identified at this time  Skin:  Skin Assessment: Reviewed RN Assessment  Last BM:  08/06/20  Height:   Ht Readings from Last 1 Encounters:  08/06/20 5\' 6"  (1.676 m)     Weight:   Wt Readings from Last 1 Encounters:  08/06/20 63.4 kg    Ideal Body Weight:  59.1 kg  BMI:  Body mass index is 22.56 kg/m.  Estimated Nutritional Needs:   Kcal:  1700-1900  Protein:  90-105 grams  Fluid:  > 1.7 L    Loistine Chance, RD, LDN, [place] Registered Dietitian II Certified Diabetes Care and Education Specialist Please refer to [facility] for RD and/or RD on-call/weekend/after hours pager

## 2020-08-08 ENCOUNTER — Inpatient Hospital Stay (HOSPITAL_COMMUNITY): Payer: Medicare HMO

## 2020-08-08 DIAGNOSIS — C3491 Malignant neoplasm of unspecified part of right bronchus or lung: Secondary | ICD-10-CM

## 2020-08-08 LAB — BASIC METABOLIC PANEL
Anion gap: 9 (ref 5–15)
BUN: 28 mg/dL — ABNORMAL HIGH (ref 8–23)
CO2: 26 mmol/L (ref 22–32)
Calcium: 9.2 mg/dL (ref 8.9–10.3)
Chloride: 103 mmol/L (ref 98–111)
Creatinine, Ser: 0.68 mg/dL (ref 0.44–1.00)
GFR, Estimated: >60 mL/min (ref >60)
Glucose, Bld: 107 mg/dL — ABNORMAL HIGH (ref 70–99)
Potassium: 4.4 mmol/L (ref 3.5–5.1)
Sodium: 138 mmol/L (ref 135–145)

## 2020-08-08 LAB — CBC
HCT: 36.9 % (ref 36.0–46.0)
Hemoglobin: 12.6 g/dL (ref 12.0–15.0)
MCH: 28.6 pg (ref 26.0–34.0)
MCHC: 34.1 g/dL (ref 30.0–36.0)
MCV: 83.9 fL (ref 80.0–100.0)
Platelets: 200 10*3/uL (ref 150–400)
RBC: 4.4 MIL/uL (ref 3.87–5.11)
RDW: 15 % (ref 11.5–15.5)
WBC: 11.3 10*3/uL — ABNORMAL HIGH (ref 4.0–10.5)
nRBC: 0 % (ref 0.0–0.2)

## 2020-08-08 LAB — BRAIN NATRIURETIC PEPTIDE: B Natriuretic Peptide: 177 pg/mL — ABNORMAL HIGH (ref 0.0–100.0)

## 2020-08-08 MED ORDER — METHYLPREDNISOLONE SODIUM SUCC 40 MG IJ SOLR
40.0000 mg | Freq: Two times a day (BID) | INTRAMUSCULAR | Status: DC
  Administered 2020-08-09: 40 mg via INTRAVENOUS
  Filled 2020-08-08: qty 1

## 2020-08-08 NOTE — Plan of Care (Signed)
  Problem: Acute Rehab PT Goals(only PT should resolve) Goal: Patient Will Transfer Sit To/From Stand Outcome: Progressing Flowsheets (Taken 08/08/2020 1429) Patient will transfer sit to/from stand: with modified independence Goal: Pt Will Transfer Bed To Chair/Chair To Bed Outcome: Progressing Flowsheets (Taken 08/08/2020 1429) Pt will Transfer Bed to Chair/Chair to Bed: with modified independence Goal: Pt Will Ambulate Outcome: Progressing Flowsheets (Taken 08/08/2020 1429) Pt will Ambulate:  25 feet  with least restrictive assistive device  with supervision Goal: Pt/caregiver will Perform Home Exercise Program Outcome: Progressing Flowsheets (Taken 08/08/2020 1429) Pt/caregiver will Perform Home Exercise Program:  For increased strengthening  For improved balance  Independently   2:30 PM, 08/08/20 Mearl Latin PT, DPT Physical Therapist at [facility]

## 2020-08-08 NOTE — Progress Notes (Signed)
Patient Demographics:    Courtney Zuniga, is a 70 y.o. female, DOB - 1950/08/21, WIO:[id number]  Admit date - 08/06/2020   Admitting Physician Neena Rhymes, MD Outpatient Primary MD for the patient is Barry Dienes, NP  LOS - 2   Chief Complaint  Patient presents with  . Chest Pain        Subjective:    Courtney Zuniga was seen and examined this morning, continues to be on supplemental oxygen, 3 L satting 93% at rest, worsening shortness of breath with exertion -Dyspnea on exertion persist -Oxygen requirement continues to be above baseline -Continue to complain of persistent cough and congestion   Assessment  & Plan :    Principal Problem:   Sepsis due to CAP Active Problems:   HTN (hypertension)   Asthma, chronic   COPD with acute exacerbation (HCC)   CAP (community acquired pneumonia)   Adenocarcinoma of right lung, stage 3 ([place])   Acute on chronic respiratory failure with hypoxia and hypercapnia (HCC)   GERD (gastroesophageal reflux disease)  Brief Summary:- 70 y.o. female with medical history significant of NSCCa Lung 2017 with recurrence now on immunotherapy with last infusion 07/26/20, Asthma/COPD, HTN, chronic pain who is usually on home oxygen at 3L/[place] admitted with acute on chronic hypoxic respiratory failure and right lower lobe pneumonia  -----------------------------------------------------------------------------------------------------------------------------------------  A/p  1)Sepsis due to right lower lobe pneumonia/CAP----POA-- -much improved sepsis physiology -patient with CAP Superimposed on severe emphysema and recurrent lung cancer --CTA chest shows New patchy ground-glass and airspace densities within the superior segment of right lower lobe posterior to the right hilum  -No fever or leukocytosis noted, however patient is immunocompromised due to recurrent lung cancer  on immunotherapy -Remains afebrile, normotensive -We will continue with IV Rocephin and azithromycin  -Continue mucolytics and bronchodilators -We will discontinue IV fluids   2) acute on chronic hypoxic respiratory failure --- PTA patient was on 2 to 3 L at home, currently improving down from 4 L to 3 L of oxygen, satting 93% -We will discontinue IV fluids -Continue mucolytic's, as needed bronchodilators  3) Recurrent lung cancer --NSCCa--patient sees Dr. Earlie Server the oncologist -Last dose of Opdivo immunotherapy was 07/26/2020 -We will return to her oncologist for continued treatment as an outpatient  4) social/ethics--- patient is a full code without limitations to treatment  5)HTN -Remained stable -continue metoprolol 25 mg daily  Disposition/Need for in-Hospital Stay- patient unable to be discharged at this time due to  -sepsis secondary to pneumonia requiring IV antibiotics, shortness of breath.  Status is: Inpatient  Remains inpatient appropriate because:Please see disposition above  Disposition: The patient is from: Home              Anticipated d/c is to: Home              Anticipated d/c date is: 2 days              Patient currently is not medically stable to d/c. Barriers: Not Clinically Stable-   Code Status :  -  Code Status: Full Code   Family Communication:    (patient is alert, awake and coherent)   Consults  :  na  DVT Prophylaxis  :   - SCDs  enoxaparin (LOVENOX) injection 40 mg Start: 08/06/20 1800 -Protonix for GI prophylaxis especially while on steroids and - Lovenox for DVT prophylaxis especially given underlying malignancy with hypercoagulable state     Lab Results  Component Value Date   PLT 200 08/08/2020    Inpatient Medications  Scheduled Meds: . atorvastatin  10 mg Oral Daily  . azithromycin  500 mg Oral Q24H  . Chlorhexidine Gluconate Cloth  6 each Topical Daily  . cyclobenzaprine  10 mg Oral BID  . dextromethorphan-guaiFENesin   1 tablet Oral BID  . DULoxetine  30 mg Oral Daily  . enoxaparin (LOVENOX) injection  40 mg Subcutaneous Q24H  . feeding supplement  237 mL Oral BID BM  . fluticasone furoate-vilanterol  1 puff Inhalation Daily  . guaiFENesin  600 mg Oral BID  . ipratropium-albuterol  3 mL Nebulization TID  . methylPREDNISolone (SOLU-MEDROL) injection  40 mg Intravenous Q8H  . metoprolol tartrate  25 mg Oral BID  . multivitamin with minerals  1 tablet Oral Daily  . pantoprazole  40 mg Oral Daily  . topiramate  25 mg Oral BID   Continuous Infusions: . sodium chloride 50 mL/hr at 08/08/20 1357  . cefTRIAXone (ROCEPHIN)  IV Stopped (08/07/20 1751)   PRN Meds:.acetaminophen, albuterol, ipratropium-albuterol, oxyCODONE-acetaminophen    Anti-infectives (From admission, onward)   Start     Dose/Rate Route Frequency Ordered Stop   08/07/20 1800  azithromycin (ZITHROMAX) tablet 500 mg        500 mg Oral Every 24 hours 08/06/20 1751 08/11/20 1759   08/07/20 1800  cefTRIAXone (ROCEPHIN) 2 g in sodium chloride 0.9 % 100 mL IVPB       Note to Pharmacy: Received levaquin 750 mg IV in ED, start rocephin 08/07/20   2 g 200 mL/hr over 30 Minutes Intravenous Every 24 hours 08/06/20 1752 08/11/20 1759   08/06/20 1800  cefTRIAXone (ROCEPHIN) 2 g in sodium chloride 0.9 % 100 mL IVPB  Status:  Discontinued        2 g 200 mL/hr over 30 Minutes Intravenous Every 24 hours 08/06/20 1751 08/06/20 1752   08/06/20 1715  levofloxacin (LEVAQUIN) IVPB 750 mg        750 mg 100 mL/hr over 90 Minutes Intravenous  Once 08/06/20 1710 08/06/20 2004        Objective:   Vitals:   08/08/20 0900 08/08/20 1000 08/08/20 1100 08/08/20 1435  BP: 123/64 106/73 116/77   Pulse: (!) 115 75 93   Resp: (!) 22  (!) 22   Temp:   98 F (36.7 C)   TempSrc:   Oral   SpO2: 91% 95% 98% 93%  Weight:      Height:        Wt Readings from Last 3 Encounters:  08/08/20 63.6 kg  07/26/20 64 kg  06/21/20 65.5 kg     Intake/Output Summary  (Last 24 hours) at 08/08/2020 1515 Last data filed at 08/08/2020 1601 Gross per 24 hour  Intake 1197.36 ml  Output 600 ml  Net 597.36 ml        Physical Exam:   General:  Alert, oriented, cooperative, mild-moderate distress, complaining of shortness of breath  HEENT:  Normocephalic, PERRL, otherwise with in Normal limits   Neuro:  CNII-XII intact. , normal motor and sensation, reflexes intact   Lungs:   Clear to auscultation BL, Respirations unlabored, no wheezes / crackles  Cardio:    S1/S2, RRR, No murmure, No Rubs or Gallops  Abdomen:   Soft, non-tender, bowel sounds active all four quadrants,  no guarding or peritoneal signs.  Muscular skeletal:  Limited exam - in bed, otherwise Renocis, able to move all 4 extremities, Normal strength,  2+ pulses,  symmetric, No pitting edema  Skin:  Dry, warm to touch, negative for any Rashes,  Wounds: Please see nursing documentation          Data Review:   Micro Results Recent Results (from the past 240 hour(s))  MRSA PCR Screening     Status: None   Collection Time: 08/06/20  9:47 PM   Specimen: Nasopharyngeal  Result Value Ref Range Status   MRSA by PCR NEGATIVE NEGATIVE Final    Comment:        The GeneXpert MRSA Assay (FDA approved for NASAL specimens only), is one component of a comprehensive MRSA colonization surveillance program. It is not intended to diagnose MRSA infection nor to guide or monitor treatment for MRSA infections. Performed at [facility], [address] [place], [place] [address]   Resp Panel by RT-PCR (Flu A&B, Covid) Nasopharyngeal Swab     Status: None   Collection Time: 08/06/20  9:50 PM   Specimen: Nasopharyngeal Swab; Nasopharyngeal(NP) swabs in vial transport medium  Result Value Ref Range Status   SARS Coronavirus 2 by RT PCR NEGATIVE NEGATIVE Final    Comment: (NOTE) SARS-CoV-2 target nucleic acids are NOT DETECTED.  The SARS-CoV-2 RNA is generally detectable in upper  respiratory specimens during the acute phase of infection. The lowest concentration of SARS-CoV-2 viral copies this assay can detect is 138 copies/mL. A negative result does not preclude SARS-Cov-2 infection and should not be used as the sole basis for treatment or other patient management decisions. A negative result may occur with  improper specimen collection/handling, submission of specimen other than nasopharyngeal swab, presence of viral mutation(s) within the areas targeted by this assay, and inadequate number of viral copies(<138 copies/mL). A negative result must be combined with clinical observations, patient history, and epidemiological information. The expected result is Negative.  Fact Sheet for Patients:  EntrepreneurPulse.com.au  Fact Sheet for Healthcare Providers:  IncredibleEmployment.be  This test is no t yet approved or cleared by the [place] FDA and  has been authorized for detection and/or diagnosis of SARS-CoV-2 by FDA under an Emergency Use Authorization (EUA). This EUA will remain  in effect (meaning this test can be used) for the duration of the COVID-19 declaration under Section 564(b)(1) of the Act, 21 U.S.C.section 360bbb-3(b)(1), unless the authorization is terminated  or revoked sooner.       Influenza A by PCR NEGATIVE NEGATIVE Final   Influenza B by PCR NEGATIVE NEGATIVE Final    Comment: (NOTE) The Xpert Xpress SARS-CoV-2/FLU/RSV plus assay is intended as an aid in the diagnosis of influenza from Nasopharyngeal swab specimens and should not be used as a sole basis for treatment. Nasal washings and aspirates are unacceptable for Xpert Xpress SARS-CoV-2/FLU/RSV testing.  Fact Sheet for Patients: EntrepreneurPulse.com.au  Fact Sheet for Healthcare Providers: IncredibleEmployment.be  This test is not yet approved or cleared by the [place] FDA and has been  authorized for detection and/or diagnosis of SARS-CoV-2 by FDA under an Emergency Use Authorization (EUA). This EUA will remain in effect (meaning this test can be used) for the duration of the COVID-19 declaration under Section 564(b)(1) of the Act, 21 U.S.C. section 360bbb-3(b)(1), unless the authorization is terminated or revoked.  Performed at [facility], [address],  [place], [place] [address]     Radiology Reports CT Angio Chest PE W and/or Wo Contrast  Result Date: 08/06/2020 CLINICAL DATA:  Evaluate for acute pulmonary embolus. History of lung cancer. EXAM: CT ANGIOGRAPHY CHEST WITH CONTRAST TECHNIQUE: Multidetector CT imaging of the chest was performed using the standard protocol during bolus administration of intravenous contrast. Multiplanar CT image reconstructions and MIPs were obtained to evaluate the vascular anatomy. CONTRAST:  148mL OMNIPAQUE IOHEXOL 350 MG/ML SOLN COMPARISON:  06/05/2020. FINDINGS: Cardiovascular: Satisfactory opacification of the pulmonary arteries to the segmental level. No evidence of pulmonary embolism. Aortic atherosclerosis. No aneurysm. Increase caliber of the main pulmonary artery measures 3.2 cm in may reflect underlying PA hypertension. Coronary artery calcifications noted. Mediastinum/Nodes: Normal appearance of the thyroid gland. The trachea appears patent and is midline. Normal appearance of the esophagus. No enlarged axillary, mediastinal, or hilar lymph nodes. Lungs/Pleura: No pleural effusion. Moderate to advanced changes of centrilobular and paraseptal emphysema. -Right upper lobe lung mass measures 3.1 x 2.7 by 4.3 cm (volume = 19 cm^3), image 33/6 and image 92/7. Previously this measured 5.2 x 3.1 by 4.8 cm (volume = 41 cm^3). Surrounding scarring and architectural distortion is again noted. -The previously mentioned posterior right upper lobe satellite nodule measures 1.1 cm, image 32/6. Previously 1.2 cm. -Right midlung perihilar masslike  architectural distortion and fibrosis is again noted compatible with post treatment changes. Unchanged appearance of complete atelectasis of the right middle lobe. -New patchy ground-glass and airspace densities within the superior segment of right lower lobe posterior to the right hilum is noted, image 91/6. This may reflect changes due to external beam radiation versus superimposed inflammatory or infectious process. -Within the superior segment of the left lower lobe there is a 6 mm nodule, image 71/6. This is new when compared with the previous exam. Upper Abdomen: No acute abnormality. Musculoskeletal: No chest wall abnormality. No acute or significant osseous findings. Review of the MIP images confirms the above findings. IMPRESSION: 1. No evidence for acute pulmonary embolus. 2. Decrease in size of right upper lobe lung mass with posterior satellite nodule. 3. Stable appearance of post treatment changes within the right lung. 4. New patchy ground-glass and airspace densities within the superior segment of right lower lobe posterior to the right hilum is identified. This may reflect changes due to external beam radiation versus superimposed inflammatory or infectious process. 5. New 6 mm nodule within the superior segment of left lower lobe. Etiology indeterminate. Attention in this nodule on subsequent surveillance imaging is advised. 6. Emphysema and aortic atherosclerosis. Aortic Atherosclerosis (ICD10-I70.0) and Emphysema (ICD10-J43.9). Electronically Signed   By: Kerby Moors M.D.   On: 08/06/2020 16:18   DG CHEST PORT 1 VIEW  Result Date: 08/08/2020 CLINICAL DATA:  Shortness of breath. EXAM: PORTABLE CHEST 1 VIEW COMPARISON:  CT 08/06/2020.  Chest x-ray 08/06/2020. FINDINGS: Mediastinum and hilar structures stable. Heart size stable. Stable right apical nodular density/mass. Stable post treatment changes in the right perihilar region. Slight increase in mild bibasilar atelectasis. Pulmonary nodule  in the superior segment of the left lower lobe best identified by prior CT. No pleural effusion or pneumothorax. No acute bony abnormality. IMPRESSION: 1.  Right apical nodular density/mass unchanged. 2.  Stable right perihilar post treatment changes. 3.  Progressive mild bibasilar subsegmental atelectasis. 4. Pulmonary nodule in the superior segment of the left lower lobe best identified by prior CT. Electronically Signed   By: Marcello Moores  Register   On: 08/08/2020 07:25   DG Chest  Portable 1 View  Result Date: 08/06/2020 CLINICAL DATA:  Shortness of breath. EXAM: PORTABLE CHEST 1 VIEW COMPARISON:  November 05, 2019. FINDINGS: The heart size and mediastinal contours are within normal limits. No pneumothorax or pleural effusion is noted. Stable bibasilar scarring is noted. Stable right perihilar opacity is noted concerning for chronic atelectasis. No definite acute abnormality is noted. The visualized skeletal structures are unremarkable. IMPRESSION: Stable chronic findings as described above. No definite acute abnormality is noted. Aortic Atherosclerosis (ICD10-I70.0). Electronically Signed   By: Marijo Conception M.D.   On: 08/06/2020 14:33     CBC Recent Labs  Lab 08/06/20 1404 08/07/20 0636 08/08/20 0608  WBC 9.7 9.5 11.3*  HGB 12.8 13.4 12.6  HCT 38.2 39.0 36.9  PLT 186 188 200  MCV 85.7 85.2 83.9  MCH 28.7 29.3 28.6  MCHC 33.5 34.4 34.1  RDW 15.4 15.3 15.0  LYMPHSABS 0.7 0.6*  --   MONOABS 0.8 0.2  --   EOSABS 0.0 0.0  --   BASOSABS 0.0 0.0  --     Chemistries  Recent Labs  Lab 08/06/20 1404 08/08/20 0608  NA 136 138  K 4.0 4.4  CL 99 103  CO2 25 26  GLUCOSE 96 107*  BUN 17 28*  CREATININE 0.70 0.68  CALCIUM 8.8* 9.2  AST 33  --   ALT 32  --   ALKPHOS 233*  --   BILITOT 0.3  --    ------------------------------------------------------------------------------------------------------------------ No results for input(s): CHOL, HDL, LDLCALC, TRIG, CHOLHDL, LDLDIRECT in the last  72 hours.  Lab Results  Component Value Date   HGBA1C 5.7 (H) 02/16/2014   ------------------------------------------------------------------------------------------------------------------ No results for input(s): TSH, T4TOTAL, T3FREE, THYROIDAB in the last 72 hours.  Invalid input(s): FREET3 ------------------------------------------------------------------------------------------------------------------ No results for input(s): VITAMINB12, FOLATE, FERRITIN, TIBC, IRON, RETICCTPCT in the last 72 hours.  Coagulation profile No results for input(s): INR, PROTIME in the last 168 hours.  No results for input(s): DDIMER in the last 72 hours.  Cardiac Enzymes No results for input(s): CKMB, TROPONINI, MYOGLOBIN in the last 168 hours.  Invalid input(s): CK ------------------------------------------------------------------------------------------------------------------    Component Value Date/Time   BNP 177.0 (H) 08/08/2020 1438     Deatra James M.D on 08/08/2020 at 3:15 PM  Go to [email or web address] - for contact info  [facility] - Office  [phone]

## 2020-08-08 NOTE — TOC Initial Note (Signed)
Transition of Care [facility]) - Initial/Assessment Note    Patient Details  Name: Courtney Zuniga MRN: [id number] Date of Birth: Apr 30, 1950  Transition of [facility]) CM/SW Contact:    Salome Arnt, [place] Phone Number: 08/08/2020, 2:55 PM  Clinical Narrative:   Pt admitted due to COPD exacerbation. Pt reports she lives alone and is independent with ADLs. She occasionally uses walker to ambulate. Pt is on 3L home O2 through [facility]. She reports she plans to return home when medically stable. PT evaluated pt and recommend home health. Discussed with pt who is agreeable to refer to [facility]. Referral made and accepted. Will need home health PT order. TOC will continue to follow.                 Expected Discharge Plan: [place] Barriers to Discharge: Continued Medical Work up   Patient Goals and CMS Choice Patient states their goals for this hospitalization and ongoing recovery are:: return home   Choice offered to / list presented to : Patient  Expected Discharge Plan and Services Expected Discharge Plan: [place] In-house Referral: Clinical Social Work   Post Acute Care Choice: Home Health                   DME Arranged: N/A DME Agency: NA       HH Arranged: PT [place] Agency: [place] Date [facility] Agency Contacted: 08/08/20 Time HH Agency Contacted: 51 Representative spoke with at [place]: Tommi Rumps  Prior Living Arrangements/Services   Lives with:: Self Patient language and need for interpreter reviewed:: Yes Do you feel safe going back to the place where you live?: Yes          Current home services: DME (home O2, walker, shower chair) Criminal Activity/Legal Involvement Pertinent to Current Situation/Hospitalization: No - Comment as needed  Activities of Daily Living Home Assistive Devices/Equipment: Oxygen,Walker (specify type) (Front two wheel walker) ADL Screening (condition at time of admission) Patient's  cognitive ability adequate to safely complete daily activities?: Yes Is the patient deaf or have difficulty hearing?: Yes Does the patient have difficulty seeing, even when wearing glasses/contacts?: Yes Does the patient have difficulty concentrating, remembering, or making decisions?: No Patient able to express need for assistance with ADLs?: Yes Does the patient have difficulty dressing or bathing?: Yes Independently performs ADLs?: Yes (appropriate for developmental age) Does the patient have difficulty walking or climbing stairs?: Yes Weakness of Legs: Left Weakness of Arms/Hands: Both  Permission Sought/Granted         Permission granted to share info w AGENCY: [place] granted to share info w Relationship: Home health     Emotional Assessment   Attitude/Demeanor/Rapport: Engaged Affect (typically observed): Accepting Orientation: : Oriented to Self,Oriented to Place,Oriented to  Time,Oriented to Situation Alcohol / Substance Use: Not Applicable Psych Involvement: No (comment)  Admission diagnosis:  COPD exacerbation ([place]) [J44.1] CAP (community acquired pneumonia) [J18.9] Patient Active Problem List   Diagnosis Date Noted  . COPD exacerbation ([place]) 11/05/2019  . Chronic nonintractable headache   . Acute on chronic respiratory failure with hypoxia and hypercapnia ([place]) 06/16/2018  . Influenza A 06/16/2018  . Severe protein-calorie malnutrition ([place]) 06/16/2018  . GERD (gastroesophageal reflux disease) 06/16/2018  . PNA (pneumonia) 05/05/2018  . Sepsis due to CAP 05/05/2018  . Chronic respiratory failure ([place]) 05/05/2018  . Encounter for antineoplastic immunotherapy 04/23/2017  . Depression 05/15/2016  . Adenocarcinoma of right lung, stage  3 ([place]) 01/10/2016  . Encounter for antineoplastic chemotherapy 01/10/2016  . Lung mass 01/03/2016  . Arthritis   . Subepithelial gastric mass   . Mucosal abnormality of stomach   . Abdominal pain 08/08/2015  .  Abnormal CT of the abdomen 08/08/2015  . Influenza with pneumonia 06/27/2014  . Hypoxia 06/26/2014  . COPD with acute exacerbation ([place]) 06/26/2014  . CAP (community acquired pneumonia)   . Chest pain, rule out acute myocardial infarction   . Asthma, chronic   . Chest pain 02/16/2014  . HTN (hypertension) 02/16/2014  . DDD (degenerative disc disease), lumbar 02/04/2011  . Lumbar herniated disc 01/09/2011   PCP:  Barry Dienes, NP Pharmacy:   [facility], [place] [address] [place] [address] Phone: [phone] Fax: [phone]  [facility] [place], [place] [address] Phone: [phone] Fax: [phone]     Social Determinants of Health (SDOH) Interventions    Readmission Risk Interventions No flowsheet data found.

## 2020-08-08 NOTE — Evaluation (Signed)
Physical Therapy Evaluation Patient Details Name: Courtney Zuniga MRN: [id number] DOB: [birth year] Today's Date: 08/08/2020   History of Present Illness  Courtney Zuniga is a 70 y.o. 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 Starting yesterday she had cough, anterior chest pain described as a weight on her chest and increased SOB. EMS activated and patient transported to [facility] for evaluation.    Clinical Impression  Patient functioning near baseline for functional mobility and gait but is limited as stated below secondary to BLE weakness, fatigue and impaired standing balance. Patient does not require assist for bed mobility and does show good sitting balance and sitting tolerance EOB. Patient able to don socks without assist. Patient transfers to standing without AD and ambulates in room without AD with minimal unsteadiness. Patient O2 sat monitored throughout session at 97-98% at rest on 3 L, which decreases to low 90s with movement and decreases to 87% with ambulation in room on 3L. Patient sat increases after a few minutes of rest on supplemental O2. Patient returned to bed at end of session.  Patient will benefit from continued physical therapy in hospital and recommended venue below to increase strength, balance, endurance for safe ADLs and gait.      Follow Up Recommendations Home health PT;Supervision - Intermittent    Equipment Recommendations  None recommended by PT    Recommendations for Other Services       Precautions / Restrictions Precautions Precautions: Fall Restrictions Weight Bearing Restrictions: No      Mobility  Bed Mobility Overal bed mobility: Modified Independent                  Transfers Overall transfer level: Needs assistance Equipment used: None Transfers: Sit to/from Stand;Stand Pivot  Transfers Sit to Stand: Min guard Stand pivot transfers: Min guard       General transfer comment: transfer to standing without AD  Ambulation/Gait Ambulation/Gait assistance: Min guard Gait Distance (Feet): 15 Feet Assistive device: None Gait Pattern/deviations: Step-through pattern;Decreased step length - right;Decreased step length - left;Decreased stride length Gait velocity: decreased   General Gait Details: slow, labored cadence with increasing SOB  Stairs            Wheelchair Mobility    Modified Rankin (Stroke Patients Only)       Balance Overall balance assessment: Needs assistance Sitting-balance support: No upper extremity supported Sitting balance-Leahy Scale: Good Sitting balance - Comments: seated EOB   Standing balance support: No upper extremity supported Standing balance-Leahy Scale: Fair Standing balance comment: fair/good without AD                             Pertinent Vitals/Pain Pain Assessment: No/denies pain    Home Living Family/patient expects to be discharged to:: Private residence Living Arrangements: Alone Available Help at Discharge: Family Type of Home: House Home Access: Stairs to enter Entrance Stairs-Rails: Left Entrance Stairs-Number of Steps: 3 Home Layout: One level;Full bath on main level Home Equipment: Walker - 2 wheels;Cane - single point Additional Comments: on 3 LPM home O2    Prior Function Level of Independence: Needs assistance   Gait / Transfers Assistance Needed: household ambulator using walls for support  ADL's / Homemaking Assistance Needed: able to compelte basic ADL without assist but extra time, family assist PRN  Hand Dominance        Extremity/Trunk Assessment   Upper Extremity Assessment Upper Extremity Assessment: Generalized weakness    Lower Extremity Assessment Lower Extremity Assessment: Generalized weakness    Cervical / Trunk Assessment Cervical / Trunk  Assessment: Normal  Communication   Communication: No difficulties  Cognition Arousal/Alertness: Awake/alert Behavior During Therapy: WFL for tasks assessed/performed Overall Cognitive Status: Within Functional Limits for tasks assessed                                        General Comments      Exercises     Assessment/Plan    PT Assessment Patient needs continued PT services  PT Problem List Decreased strength;Decreased mobility;Cardiopulmonary status limiting activity;Decreased activity tolerance;Decreased balance       PT Treatment Interventions DME instruction;Therapeutic exercise;Gait training;Balance training;Stair training;Neuromuscular re-education;Functional mobility training;Therapeutic activities;Patient/family education    PT Goals (Current goals can be found in the Care Plan section)  Acute Rehab PT Goals Patient Stated Goal: Return home PT Goal Formulation: With patient Time For Goal Achievement: 08/22/20 Potential to Achieve Goals: Good    Frequency Min 3X/week   Barriers to discharge        Co-evaluation               AM-PAC PT "6 Clicks" Mobility  Outcome Measure Help needed turning from your back to your side while in a flat bed without using bedrails?: None Help needed moving from lying on your back to sitting on the side of a flat bed without using bedrails?: None Help needed moving to and from a bed to a chair (including a wheelchair)?: A Little Help needed standing up from a chair using your arms (e.g., wheelchair or bedside chair)?: A Little Help needed to walk in hospital room?: A Little Help needed climbing 3-5 steps with a railing? : A Lot 6 Click Score: 19    End of Session Equipment Utilized During Treatment: Oxygen;Gait belt Activity Tolerance: Patient limited by fatigue;Patient tolerated treatment well Patient left: in bed;with call bell/phone within reach;with bed alarm set Nurse Communication: Mobility  status PT Visit Diagnosis: Unsteadiness on feet (R26.81);Other abnormalities of gait and mobility (R26.89);Muscle weakness (generalized) (M62.81)    Time: 6629-4765 PT Time Calculation (min) (ACUTE ONLY): 17 min   Charges:   PT Evaluation $PT Eval Moderate Complexity: 1 Mod PT Treatments $Therapeutic Activity: 8-22 mins         2:28 PM, 08/08/20 Mearl Latin PT, DPT Physical Therapist at [facility]

## 2020-08-09 LAB — BRAIN NATRIURETIC PEPTIDE: B Natriuretic Peptide: 113 pg/mL — ABNORMAL HIGH (ref 0.0–100.0)

## 2020-08-09 LAB — CBC WITH DIFFERENTIAL/PLATELET
Abs Immature Granulocytes: 0.03 10*3/uL (ref 0.00–0.07)
Basophils Absolute: 0 10*3/uL (ref 0.0–0.1)
Basophils Relative: 0 %
Eosinophils Absolute: 0 10*3/uL (ref 0.0–0.5)
Eosinophils Relative: 0 %
HCT: 39.1 % (ref 36.0–46.0)
Hemoglobin: 13.1 g/dL (ref 12.0–15.0)
Immature Granulocytes: 0 %
Lymphocytes Relative: 8 %
Lymphs Abs: 0.6 10*3/uL — ABNORMAL LOW (ref 0.7–4.0)
MCH: 28.7 pg (ref 26.0–34.0)
MCHC: 33.5 g/dL (ref 30.0–36.0)
MCV: 85.6 fL (ref 80.0–100.0)
Monocytes Absolute: 0.4 10*3/uL (ref 0.1–1.0)
Monocytes Relative: 5 %
Neutro Abs: 6.5 10*3/uL (ref 1.7–7.7)
Neutrophils Relative %: 87 %
Platelets: 219 10*3/uL (ref 150–400)
RBC: 4.57 MIL/uL (ref 3.87–5.11)
RDW: 15.6 % — ABNORMAL HIGH (ref 11.5–15.5)
WBC: 7.5 10*3/uL (ref 4.0–10.5)
nRBC: 0 % (ref 0.0–0.2)

## 2020-08-09 LAB — LACTIC ACID, PLASMA: Lactic Acid, Venous: 1.3 mmol/L (ref 0.5–1.9)

## 2020-08-09 LAB — PROCALCITONIN: Procalcitonin: <0.1 ng/mL

## 2020-08-09 MED ORDER — CYCLOBENZAPRINE HCL 10 MG PO TABS: 10.0000 mg | ORAL_TABLET | Freq: Three times a day (TID) | ORAL | Status: DC | PRN

## 2020-08-09 MED ORDER — METOPROLOL TARTRATE 25 MG PO TABS: 25.0000 mg | ORAL_TABLET | Freq: Two times a day (BID) | ORAL | 0 refills | Status: DC

## 2020-08-09 MED ORDER — LEVOFLOXACIN 750 MG PO TABS: 750.0000 mg | ORAL_TABLET | Freq: Every day | ORAL | 0 refills | Status: AC

## 2020-08-09 MED ORDER — DM-GUAIFENESIN ER 30-600 MG PO TB12: 1.0000 | ORAL_TABLET | Freq: Two times a day (BID) | ORAL | 0 refills | Status: DC

## 2020-08-09 MED ORDER — FUROSEMIDE 10 MG/ML IJ SOLN
40.0000 mg | Freq: Once | INTRAMUSCULAR | Status: AC
  Administered 2020-08-09: 40 mg via INTRAVENOUS
  Filled 2020-08-09: qty 4

## 2020-08-09 MED ORDER — METHYLPREDNISOLONE SODIUM SUCC 40 MG IJ SOLR: 40.0000 mg | INTRAMUSCULAR | Status: DC

## 2020-08-09 NOTE — TOC Transition Note (Signed)
Transition of Care [facility]) - CM/SW Discharge Note   Patient Details  Name: Courtney Zuniga MRN: [id number] Date of Birth: Apr 30, 1950  Transition of Care [facility]) CM/SW Contact:  Boneta Lucks, RN Phone Number: 08/09/2020, 12:41 PM   Clinical Narrative:   Patient discharging home, home health orders are in , [place] with [place] updated.     Final next level of care: Home w Home Health Services Barriers to Discharge: Barriers Resolved   Patient Goals and CMS Choice Patient states their goals for this hospitalization and ongoing recovery are:: return home   Choice offered to / list presented to : Patient  Discharge Placement               Patient and family notified of of transfer: 08/09/20  Discharge Plan and Services In-house Referral: Clinical Social Work   Post Acute Care Choice: Home Health          DME Arranged: N/A DME Agency: NA    HH Arranged: PT HH Agency: [place] Date [facility] Agency Contacted: 08/08/20 Time [place]: 89 Representative spoke with at [place] [facility]: [place]   Readmission Risk Interventions Readmission Risk Prevention Plan 08/09/2020  Medication Screening Complete  Transportation Screening Complete  Some recent data might be hidden

## 2020-08-09 NOTE — Evaluation (Signed)
Occupational Therapy Evaluation Patient Details Name: Courtney Zuniga MRN: [id number] DOB: 15-Nov-1950 Today's Date: 08/09/2020    History of Present Illness Courtney Zuniga is a 70 y.o. 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 Starting yesterday she had cough, anterior chest pain described as a weight on her chest and increased SOB. EMS activated and patient transported to [facility] for evaluation.   Clinical Impression   Pt agreeable to OT evaluation. While donning 3 L O2 seated at EOB pt's SpO2 was recorded at 97%. While standing pt SpO2 levels fluctuated between 90 to 95%. Pt required rest breaks and demonstrated labored breathing and movement during functional transfers and grooming while standing at the sink. Min guard assist required during transfers with min loss of balance noted laterally with attempts to lean on surrounding objects during ambulation. Pt will benefit from continued OT in the hospital and recommended venue below to increase strength, balance, and endurance for safe ADL's.     Follow Up Recommendations  Home health OT;Supervision - Intermittent (SPV during transfers.)    Equipment Recommendations  None recommended by OT           Precautions / Restrictions Precautions Precautions: Fall Restrictions Weight Bearing Restrictions: No      Mobility Bed Mobility Overal bed mobility: Modified Independent                  Transfers Overall transfer level: Needs assistance Equipment used: None Transfers: Sit to/from [facility] Sit to Stand: Min guard Stand pivot transfers: Min guard       General transfer comment: transfer to standing and ambulating to sink without AD. Transfer to toilet and back to EOB without AD. Noted to lean on walls for support.    Balance Overall balance  assessment: Needs assistance Sitting-balance support: No upper extremity supported Sitting balance-Leahy Scale: Good Sitting balance - Comments: seated EOB   Standing balance support: No upper extremity supported Standing balance-Leahy Scale: Fair Standing balance comment: noted lateral loss of balance minimally during functional ambulation/transfers without AD.                           ADL either performed or assessed with clinical judgement   ADL Overall ADL's : Needs assistance/impaired     Grooming: Wash/dry hands;Standing;Supervision/safety;Min guard                   Toilet Transfer: Min guard;Ambulation Toilet Transfer Details (indicate cue type and reason): simulated via ambulatory transfer from standing at sink to sitting on toilet and returning to EOB.                 Vision Baseline Vision/History: Wears glasses Wears Glasses:  (sometimes) Patient Visual Report: No change from baseline           Hand Dominance Right   Extremity/Trunk Assessment Upper Extremity Assessment Upper Extremity Assessment: Generalized weakness   Lower Extremity Assessment Lower Extremity Assessment: Defer to PT evaluation   Cervical / Trunk Assessment Cervical / Trunk Assessment: Normal   Communication Communication Communication: No difficulties   Cognition Arousal/Alertness: Awake/alert Behavior During Therapy: WFL for tasks assessed/performed Overall Cognitive Status: Within Functional Limits for tasks assessed  Home Living Family/patient expects to be discharged to:: Private residence Living Arrangements: Children Available Help at Discharge: Family Type of Home: House Home Access: Stairs to enter Technical brewer of Steps: 3 Entrance Stairs-Rails: Left Home Layout: One level     Bathroom Shower/Tub: Teacher, early years/pre: Standard Bathroom  Accessibility: Yes How Accessible: Accessible via walker Home Equipment: [place] - 2 wheels;Shower seat   Additional Comments: on 3 LPM home O2      Prior Functioning/Environment Level of Independence: Needs assistance  Gait / Transfers Assistance Needed: household ambulator using walls for support ADL's / Homemaking Assistance Needed: able to compelte basic ADL without assist; Pt reports independence with IADL's as well with rest breaks. Pt drives some.            OT Problem List: Decreased strength;Decreased activity tolerance;Impaired balance (sitting and/or standing)      OT Treatment/Interventions: Self-care/ADL training;Therapeutic exercise;Energy conservation;DME and/or AE instruction;Therapeutic activities;Balance training;Patient/family education    OT Goals(Current goals can be found in the care plan section) Acute Rehab OT Goals Patient Stated Goal: Return home OT Goal Formulation: With patient Time For Goal Achievement: 08/23/20 Potential to Achieve Goals: Good  OT Frequency: Min 1X/week    End of Session Equipment Utilized During Treatment: Oxygen (3L)  Activity Tolerance: Patient tolerated treatment well Patient left: in bed;with call bell/phone within reach  OT Visit Diagnosis: Unsteadiness on feet (R26.81);Muscle weakness (generalized) (M62.81)                Time: 2585-2778 OT Time Calculation (min): 18 min Charges:  OT General Charges $OT Visit: 1 Visit OT Evaluation $OT Eval Low Complexity: 1 Low  Courtney Zuniga OT, MOT   Larey Seat 08/09/2020, 9:46 AM

## 2020-08-09 NOTE — Plan of Care (Signed)
  Problem: Acute Rehab OT Goals (only OT should resolve) Goal: Pt. Will Perform Grooming Flowsheets (Taken 08/09/2020 0949) Pt Will Perform Grooming:  with modified independence  standing Goal: Pt. Will Perform Lower Body Dressing Flowsheets (Taken 08/09/2020 0949) Pt Will Perform Lower Body Dressing:  with modified independence  sit to/from stand Goal: Pt. Will Transfer To Toilet [place] (Taken 08/09/2020 [phone]) Pt Will Transfer to Toilet:  with modified independence  ambulating  stand pivot transfer Goal: Pt/Caregiver Will Perform Home Exercise Program Flowsheets (Taken 08/09/2020 [phone]) Pt/caregiver will Perform Home Exercise Program:  Increased strength  Both right and left upper extremity  Independently  Conita Amenta OT, MOT

## 2020-08-09 NOTE — Discharge Summary (Signed)
Physician Discharge Summary [facility]    Patient: Courtney Zuniga                   Admit date: 08/06/2020   DOB: 06-20-1950             Discharge date:08/09/2020/12:00 PM URK:[id number]                          PCP: Barry Dienes, NP  Disposition: HOME with Home Health   Recommendations for Outpatient Follow-up:   . Follow up: in 1 week with PCP, and oncologist . Note to PCP: Patient likely needs a referral to pulmonologist . Overall seems she is declining-healthcare power attorney needs to be assigned, alternative living arrangement should be considered as currently patient is living alone  Discharge Condition: Stable   Code Status:   Code Status: Full Code  Diet recommendation: Cardiac diet   Discharge Diagnoses:    Principal Problem:   Sepsis due to CAP Active Problems:   HTN (hypertension)   Asthma, chronic   COPD with acute exacerbation ([place])   CAP (community acquired pneumonia)   Adenocarcinoma of right lung, stage 3 ([place])   Acute on chronic respiratory failure with hypoxia and hypercapnia ([place])   GERD (gastroesophageal reflux disease)   History of Present Illness/ Hospital Course Kathleen Argue Summary:    Brief Summary:- 70 y.o.femalewith medical history significant ofNSCCa Lung 2017 with recurrence now on immunotherapy with last infusion 07/26/20, Asthma/COPD, HTN, chronic pain who is usually on home oxygen at 3L/[place] admitted with acute on chronic hypoxic respiratory failure and right lower lobe pneumonia  -----------------------------------------------------------------------------------------------------------------------------------------  1)Sepsis due to right lower lobe pneumonia/CAP----POA-- -much improved sepsis physiology -patient with CAP Superimposed on severe emphysema and recurrent lung cancer --CTA chest shows New patchy ground-glass and airspace densities within the superior segment of right lower lobe posterior to the right hilum  -No  fever or leukocytosis noted, however patient is immunocompromised due to recurrent lung cancer on immunotherapy -Remains afebrile, normotensive -We will continue with IV Rocephin and azithromycin >>> to p.o. Levaquin for  -Continue mucolytics and bronchodilators --home inhalers -Need IV fluids  2) acute on chronic hypoxic respiratory failure --- PTA patient was on 2 to 3 L at home, currently improving down from 4 L to 3 L of oxygen, satting 93% Back to baseline on 3 L of oxygen, satting 96%  -Continue mucolytic's, home inhalers  3) Recurrent lung cancer --NSCCa--patient sees Dr. Earlie Server the oncologist -Last dose of Opdivo immunotherapy was 07/26/2020 -We will return to her oncologist for continued treatment as an outpatient  4) social/ethics--- patient is a full code without limitations to treatment  5)HTN -Remained stable -continue metoprolol 25 mg daily     Disposition: The patient is from: Home  Anticipated d/c is to: Home w [facility]  .   Code Status :  -  Code Status: Full Code  Nutritional status:  Nutrition Problem: Increased nutrient needs Etiology: chronic illness (COPD) Signs/Symptoms: estimated needs Interventions: Ensure Enlive (each supplement provides 350kcal and 20 grams of protein),MVI  The patient's BMI is: Body mass index is 22.63 kg/m. I agree with the assessment and plan as outlined   Risk of unplanned readmission Score:  Moderate-high due to comorbidities   Discharge Instructions:   Discharge Instructions    Activity as tolerated - No restrictions   Complete by: As directed    Diet - low sodium heart healthy   Complete by: As  directed    Discharge instructions   Complete by: As directed    Continue using oxygen at 3 L, at all times, especially when ambulating. Continue using your inhalers as instructed, with as needed breathing treatment through your nebulizer machine. Continue current recommended antibiotics. Close  follow-up with your oncologist within 1-2 weeks   Increase activity slowly   Complete by: As directed        Medication List    STOP taking these medications   meloxicam 15 MG tablet Commonly known as: MOBIC   methocarbamol 500 MG tablet Commonly known as: ROBAXIN   nicotine 14 mg/24hr patch Commonly known as: NICODERM CQ - dosed in mg/24 hours   pantoprazole 40 MG tablet Commonly known as: PROTONIX     TAKE these medications   acetaminophen 325 MG tablet Commonly known as: TYLENOL Take 2 tablets (650 mg total) by mouth every 6 (six) hours as needed for mild pain, fever or headache.   albuterol 108 (90 Base) MCG/ACT inhaler Commonly known as: VENTOLIN HFA Inhale 2 puffs into the lungs every 4 (four) hours as needed for shortness of breath.   atorvastatin 10 MG tablet Commonly known as: LIPITOR Take 10 mg by mouth daily.   Breo Ellipta 100-25 MCG/INH Aepb Generic drug: fluticasone furoate-vilanterol Inhale 1 puff into the lungs daily.   cyclobenzaprine 10 MG tablet Commonly known as: FLEXERIL Take 1 tablet (10 mg total) by mouth 2 (two) times daily. *MAy take one additional tablet as needed for muscle spasms   dextromethorphan-guaiFENesin 30-600 MG 12hr tablet Commonly known as: MUCINEX DM Take 1 tablet by mouth 2 (two) times daily.   diclofenac sodium 1 % Gel Commonly known as: VOLTAREN Apply topically 4 (four) times daily as needed (massge gel into affected area(s) as needed for pain).   DULoxetine 30 MG capsule Commonly known as: CYMBALTA Take 30 mg by mouth daily.   Dupixent 300 MG/2ML Sopn Generic drug: Dupilumab Inject 300 mg into the skin every 14 (fourteen) days. Twice a month   Ensure Take 237 mLs by mouth 3 (three) times daily between meals.   ipratropium-albuterol 0.5-2.5 (3) MG/3ML Soln Commonly known as: DUONEB Take 3 mLs by nebulization 3 (three) times daily.   Ipratropium-Albuterol 20-100 MCG/ACT Aers respimat Commonly known as:  COMBIVENT Inhale 2 puffs into the lungs 4 (four) times daily as needed.   levofloxacin 750 MG tablet Commonly known as: Levaquin Take 1 tablet (750 mg total) by mouth daily for 7 days.   metoprolol tartrate 25 MG tablet Commonly known as: LOPRESSOR Take 1 tablet (25 mg total) by mouth 2 (two) times daily.   oxyCODONE-acetaminophen 5-325 MG tablet Commonly known as: PERCOCET/ROXICET Take 1 tablet by mouth every 8 (eight) hours as needed for moderate pain.   topiramate 25 MG tablet Commonly known as: Topamax Take 1 tablet by mouth daily at bedtime X 1 week; then increase to 25mg  BID. What changed:   how much to take  how to take this  when to take this  additional instructions   Vitamin D3 125 MCG (5000 UT) Caps Take 1 capsule by mouth daily.       Follow-up Information    [facility] Follow up.   Specialty: Home Health Services Why: Will contact you to schedule home health visits.  Contact information: [place] [address] [phone]              No Known Allergies   Procedures /Studies:  CT Angio Chest PE W and/or Wo Contrast  Result Date: 08/06/2020 CLINICAL DATA:  Evaluate for acute pulmonary embolus. History of lung cancer. EXAM: CT ANGIOGRAPHY CHEST WITH CONTRAST TECHNIQUE: Multidetector CT imaging of the chest was performed using the standard protocol during bolus administration of intravenous contrast. Multiplanar CT image reconstructions and MIPs were obtained to evaluate the vascular anatomy. CONTRAST:  169mL OMNIPAQUE IOHEXOL 350 MG/ML SOLN COMPARISON:  06/05/2020. FINDINGS: Cardiovascular: Satisfactory opacification of the pulmonary arteries to the segmental level. No evidence of pulmonary embolism. Aortic atherosclerosis. No aneurysm. Increase caliber of the main pulmonary artery measures 3.2 cm in may reflect underlying PA hypertension. Coronary artery calcifications noted. Mediastinum/Nodes: Normal  appearance of the thyroid gland. The trachea appears patent and is midline. Normal appearance of the esophagus. No enlarged axillary, mediastinal, or hilar lymph nodes. Lungs/Pleura: No pleural effusion. Moderate to advanced changes of centrilobular and paraseptal emphysema. -Right upper lobe lung mass measures 3.1 x 2.7 by 4.3 cm (volume = 19 cm^3), image 33/6 and image 92/7. Previously this measured 5.2 x 3.1 by 4.8 cm (volume = 41 cm^3). Surrounding scarring and architectural distortion is again noted. -The previously mentioned posterior right upper lobe satellite nodule measures 1.1 cm, image 32/6. Previously 1.2 cm. -Right midlung perihilar masslike architectural distortion and fibrosis is again noted compatible with post treatment changes. Unchanged appearance of complete atelectasis of the right middle lobe. -New patchy ground-glass and airspace densities within the superior segment of right lower lobe posterior to the right hilum is noted, image 91/6. This may reflect changes due to external beam radiation versus superimposed inflammatory or infectious process. -Within the superior segment of the left lower lobe there is a 6 mm nodule, image 71/6. This is new when compared with the previous exam. Upper Abdomen: No acute abnormality. Musculoskeletal: No chest wall abnormality. No acute or significant osseous findings. Review of the MIP images confirms the above findings. IMPRESSION: 1. No evidence for acute pulmonary embolus. 2. Decrease in size of right upper lobe lung mass with posterior satellite nodule. 3. Stable appearance of post treatment changes within the right lung. 4. New patchy ground-glass and airspace densities within the superior segment of right lower lobe posterior to the right hilum is identified. This may reflect changes due to external beam radiation versus superimposed inflammatory or infectious process. 5. New 6 mm nodule within the superior segment of left lower lobe. Etiology  indeterminate. Attention in this nodule on subsequent surveillance imaging is advised. 6. Emphysema and aortic atherosclerosis. Aortic Atherosclerosis (ICD10-I70.0) and Emphysema (ICD10-J43.9). Electronically Signed   By: Kerby Moors M.D.   On: 08/06/2020 16:18   DG CHEST PORT 1 VIEW  Result Date: 08/08/2020 CLINICAL DATA:  Shortness of breath. EXAM: PORTABLE CHEST 1 VIEW COMPARISON:  CT 08/06/2020.  Chest x-ray 08/06/2020. FINDINGS: Mediastinum and hilar structures stable. Heart size stable. Stable right apical nodular density/mass. Stable post treatment changes in the right perihilar region. Slight increase in mild bibasilar atelectasis. Pulmonary nodule in the superior segment of the left lower lobe best identified by prior CT. No pleural effusion or pneumothorax. No acute bony abnormality. IMPRESSION: 1.  Right apical nodular density/mass unchanged. 2.  Stable right perihilar post treatment changes. 3.  Progressive mild bibasilar subsegmental atelectasis. 4. Pulmonary nodule in the superior segment of the left lower lobe best identified by prior CT. Electronically Signed   By: Marcello Moores  Register   On: 08/08/2020 07:25   DG Chest Portable 1 View  Result Date: 08/06/2020 CLINICAL DATA:  Shortness of breath. EXAM: PORTABLE CHEST 1 VIEW COMPARISON:  November 05, 2019. FINDINGS: The heart size and mediastinal contours are within normal limits. No pneumothorax or pleural effusion is noted. Stable bibasilar scarring is noted. Stable right perihilar opacity is noted concerning for chronic atelectasis. No definite acute abnormality is noted. The visualized skeletal structures are unremarkable. IMPRESSION: Stable chronic findings as described above. No definite acute abnormality is noted. Aortic Atherosclerosis (ICD10-I70.0). Electronically Signed   By: Marijo Conception M.D.   On: 08/06/2020 14:33     Subjective:   Patient was seen and examined 08/09/2020, 12:00 PM Patient stable today. No acute distress.  No  issues overnight Stable for discharge.  Discharge Exam:    Vitals:   08/08/20 2148 08/09/20 0604 08/09/20 0732 08/09/20 1104  BP: 121/83 117/78 (!) 134/93   Pulse: (!) 58 93 93   Resp: (!) 21 16 16    Temp: 98.2 F (36.8 C) 98.4 F (36.9 C) 98.8 F (37.1 C)   TempSrc:   Oral   SpO2: 100% 98% 95% 96%  Weight:      Height:        General: Pt lying comfortably in bed & appears in no obvious distress. Cardiovascular: S1 & S2 heard, RRR, S1/S2 +. No murmurs, rubs, gallops or clicks. No JVD or pedal edema. Respiratory: Clear to auscultation without wheezing, rhonchi or crackles. No increased work of breathing. Abdominal:  Non-distended, non-tender & soft. No organomegaly or masses appreciated. Normal bowel sounds heard. CNS: Alert and oriented. No focal deficits. Extremities: no edema, no cyanosis      The results of significant diagnostics from this hospitalization (including imaging, microbiology, ancillary and laboratory) are listed below for reference.      Microbiology:   Recent Results (from the past 240 hour(s))  MRSA PCR Screening     Status: None   Collection Time: 08/06/20  9:47 PM   Specimen: Nasopharyngeal  Result Value Ref Range Status   MRSA by PCR NEGATIVE NEGATIVE Final    Comment:        The GeneXpert MRSA Assay (FDA approved for NASAL specimens only), is one component of a comprehensive MRSA colonization surveillance program. It is not intended to diagnose MRSA infection nor to guide or monitor treatment for MRSA infections. Performed at [facility], [address] [place], [place] [address]   Resp Panel by RT-PCR (Flu A&B, Covid) Nasopharyngeal Swab     Status: None   Collection Time: 08/06/20  9:50 PM   Specimen: Nasopharyngeal Swab; Nasopharyngeal(NP) swabs in vial transport medium  Result Value Ref Range Status   SARS Coronavirus 2 by RT PCR NEGATIVE NEGATIVE Final    Comment: (NOTE) SARS-CoV-2 target nucleic acids are NOT DETECTED.  The  SARS-CoV-2 RNA is generally detectable in upper respiratory specimens during the acute phase of infection. The lowest concentration of SARS-CoV-2 viral copies this assay can detect is 138 copies/mL. A negative result does not preclude SARS-Cov-2 infection and should not be used as the sole basis for treatment or other patient management decisions. A negative result may occur with  improper specimen collection/handling, submission of specimen other than nasopharyngeal swab, presence of viral mutation(s) within the areas targeted by this assay, and inadequate number of viral copies(<138 copies/mL). A negative result must be combined with clinical observations, patient history, and epidemiological information. The expected result is Negative.  Fact Sheet for Patients:  EntrepreneurPulse.com.au  Fact Sheet for Healthcare Providers:  IncredibleEmployment.be  This test is no t  yet approved or cleared by the [place] and  has been authorized for detection and/or diagnosis of SARS-CoV-2 by FDA under an Emergency Use Authorization (EUA). This EUA will remain  in effect (meaning this test can be used) for the duration of the COVID-19 declaration under Section 564(b)(1) of the Act, 21 U.S.C.section 360bbb-3(b)(1), unless the authorization is terminated  or revoked sooner.       Influenza A by PCR NEGATIVE NEGATIVE Final   Influenza B by PCR NEGATIVE NEGATIVE Final    Comment: (NOTE) The Xpert Xpress SARS-CoV-2/FLU/RSV plus assay is intended as an aid in the diagnosis of influenza from Nasopharyngeal swab specimens and should not be used as a sole basis for treatment. Nasal washings and aspirates are unacceptable for Xpert Xpress SARS-CoV-2/FLU/RSV testing.  Fact Sheet for Patients: EntrepreneurPulse.com.au  Fact Sheet for Healthcare Providers: IncredibleEmployment.be  This test is not yet approved or  cleared by the [place] FDA and has been authorized for detection and/or diagnosis of SARS-CoV-2 by FDA under an Emergency Use Authorization (EUA). This EUA will remain in effect (meaning this test can be used) for the duration of the COVID-19 declaration under Section 564(b)(1) of the Act, 21 U.S.C. section 360bbb-3(b)(1), unless the authorization is terminated or revoked.  Performed at [facility], [address], [place],  [address]      Labs:   CBC: Recent Labs  Lab 08/06/20 1404 08/07/20 0636 08/08/20 0608 08/09/20 0514  WBC 9.7 9.5 11.3* 7.5  NEUTROABS 8.2* 8.7*  --  6.5  HGB 12.8 13.4 12.6 13.1  HCT 38.2 39.0 36.9 39.1  MCV 85.7 85.2 83.9 85.6  PLT 186 188 200 174   Basic Metabolic Panel: Recent Labs  Lab 08/06/20 1404 08/08/20 0608  NA 136 138  K 4.0 4.4  CL 99 103  CO2 25 26  GLUCOSE 96 107*  BUN 17 28*  CREATININE 0.70 0.68  CALCIUM 8.8* 9.2   Liver Function Tests: Recent Labs  Lab 08/06/20 1404  AST 33  ALT 32  ALKPHOS 233*  BILITOT 0.3  PROT 7.0  ALBUMIN 3.7   BNP (last 3 results) Recent Labs    08/06/20 1404 08/08/20 0608 08/09/20 0514  BNP 47.0 177.0* 113.0*   Cardiac Enzymes: No results for input(s): CKTOTAL, CKMB, CKMBINDEX, TROPONINI in the last 168 hours. CBG: No results for input(s): GLUCAP in the last 168 hours. Hgb A1c No results for input(s): HGBA1C in the last 72 hours. Lipid Profile No results for input(s): CHOL, HDL, LDLCALC, TRIG, CHOLHDL, LDLDIRECT in the last 72 hours. Thyroid function studies No results for input(s): TSH, T4TOTAL, T3FREE, THYROIDAB in the last 72 hours.  Invalid input(s): FREET3 Anemia work up No results for input(s): VITAMINB12, FOLATE, FERRITIN, TIBC, IRON, RETICCTPCT in the last 72 hours. Urinalysis    Component Value Date/Time   COLORURINE YELLOW 05/05/2018 1539   APPEARANCEUR CLEAR 05/05/2018 1539   LABSPEC 1.006 05/05/2018 1539   PHURINE 6.0 05/05/2018 1539   GLUCOSEU  NEGATIVE 05/05/2018 1539   HGBUR NEGATIVE 05/05/2018 1539   BILIRUBINUR NEGATIVE 05/05/2018 [place] 05/05/2018 1539   PROTEINUR NEGATIVE 05/05/2018 1539   UROBILINOGEN 0.2 03/31/2014 1406   NITRITE NEGATIVE 05/05/2018 1539   LEUKOCYTESUR NEGATIVE 05/05/2018 1539         Time coordinating discharge: Over 45 minutes  SIGNED: Deatra James, MD, FACP, FHM. [facility],  Please use [email or web address] to Page If 7PM-7AM, please contact night-coverage [email or web address], Password [facility] 08/09/2020, 12:00 PM

## 2020-08-23 ENCOUNTER — Inpatient Hospital Stay: Payer: Medicare HMO | Attending: Internal Medicine | Admitting: Internal Medicine

## 2020-08-23 ENCOUNTER — Inpatient Hospital Stay: Payer: Medicare HMO

## 2020-08-23 ENCOUNTER — Other Ambulatory Visit: Payer: Self-pay

## 2020-08-23 VITALS — BP 116/70 | HR 87 | Temp 97.8°F | Resp 19 | Ht 66.0 in | Wt 143.0 lb

## 2020-08-23 DIAGNOSIS — Z5112 Encounter for antineoplastic immunotherapy: Secondary | ICD-10-CM

## 2020-08-23 DIAGNOSIS — C3491 Malignant neoplasm of unspecified part of right bronchus or lung: Secondary | ICD-10-CM

## 2020-08-23 DIAGNOSIS — C342 Malignant neoplasm of middle lobe, bronchus or lung: Secondary | ICD-10-CM | POA: Insufficient documentation

## 2020-08-23 DIAGNOSIS — Z79899 Other long term (current) drug therapy: Secondary | ICD-10-CM | POA: Diagnosis not present

## 2020-08-23 LAB — CMP (CANCER CENTER ONLY)
ALT: 19 U/L (ref 0–44)
AST: 30 U/L (ref 15–41)
Albumin: 3.3 g/dL — ABNORMAL LOW (ref 3.5–5.0)
Alkaline Phosphatase: 271 U/L — ABNORMAL HIGH (ref 38–126)
Anion gap: 9 (ref 5–15)
BUN: 9 mg/dL (ref 8–23)
CO2: 28 mmol/L (ref 22–32)
Calcium: 9.3 mg/dL (ref 8.9–10.3)
Chloride: 108 mmol/L (ref 98–111)
Creatinine: 0.66 mg/dL (ref 0.44–1.00)
GFR, Estimated: >60 mL/min (ref >60)
Glucose, Bld: 74 mg/dL (ref 70–99)
Potassium: 4.2 mmol/L (ref 3.5–5.1)
Sodium: 145 mmol/L (ref 135–145)
Total Bilirubin: <0.2 mg/dL — ABNORMAL LOW (ref 0.3–1.2)
Total Protein: 6.6 g/dL (ref 6.5–8.1)

## 2020-08-23 LAB — CBC WITH DIFFERENTIAL (CANCER CENTER ONLY)
Abs Immature Granulocytes: 0.02 10*3/uL (ref 0.00–0.07)
Basophils Absolute: 0 10*3/uL (ref 0.0–0.1)
Basophils Relative: 1 %
Eosinophils Absolute: 0.1 10*3/uL (ref 0.0–0.5)
Eosinophils Relative: 2 %
HCT: 36.1 % (ref 36.0–46.0)
Hemoglobin: 12.2 g/dL (ref 12.0–15.0)
Immature Granulocytes: 0 %
Lymphocytes Relative: 20 %
Lymphs Abs: 1.1 10*3/uL (ref 0.7–4.0)
MCH: 28.4 pg (ref 26.0–34.0)
MCHC: 33.8 g/dL (ref 30.0–36.0)
MCV: 84 fL (ref 80.0–100.0)
Monocytes Absolute: 0.3 10*3/uL (ref 0.1–1.0)
Monocytes Relative: 5 %
Neutro Abs: 3.9 10*3/uL (ref 1.7–7.7)
Neutrophils Relative %: 72 %
Platelet Count: 334 10*3/uL (ref 150–400)
RBC: 4.3 MIL/uL (ref 3.87–5.11)
RDW: 15.9 % — ABNORMAL HIGH (ref 11.5–15.5)
WBC Count: 5.5 10*3/uL (ref 4.0–10.5)
nRBC: 0 % (ref 0.0–0.2)

## 2020-08-23 LAB — TSH: TSH: 2.03 u[IU]/mL (ref 0.308–3.960)

## 2020-08-23 MED ORDER — SODIUM CHLORIDE 0.9 % IV SOLN
Freq: Once | INTRAVENOUS | Status: AC
  Filled 2020-08-23: qty 250

## 2020-08-23 MED ORDER — SODIUM CHLORIDE 0.9 % IV SOLN
480.0000 mg | Freq: Once | INTRAVENOUS | Status: AC
  Administered 2020-08-23: 480 mg via INTRAVENOUS
  Filled 2020-08-23: qty 48

## 2020-08-23 NOTE — Patient Instructions (Signed)
[facility]  Discharge Instructions: Thank you for choosing [facility] to provide your oncology and hematology care.   If you have a lab appointment with the [facility], please go directly to the [facility] and check in at the registration area.   Wear comfortable clothing and clothing appropriate for easy access to any Portacath or PICC line.   We strive to give you quality time with your provider. You may need to reschedule your appointment if you arrive late (15 or more minutes).  Arriving late affects you and other patients whose appointments are after yours.  Also, if you miss three or more appointments without notifying the office, you may be dismissed from the clinic at the provider's discretion.      For prescription refill requests, have your pharmacy contact our office and allow 72 hours for refills to be completed.    Today you received the following chemotherapy and/or immunotherapy agents opdivo   To help prevent nausea and vomiting after your treatment, we encourage you to take your nausea medication as directed.  BELOW ARE SYMPTOMS THAT SHOULD BE REPORTED IMMEDIATELY: . *FEVER GREATER THAN 100.4 F (38 C) OR HIGHER . *CHILLS OR SWEATING . *NAUSEA AND VOMITING THAT IS NOT CONTROLLED WITH YOUR NAUSEA MEDICATION . *UNUSUAL SHORTNESS OF BREATH . *UNUSUAL BRUISING OR BLEEDING . *URINARY PROBLEMS (pain or burning when urinating, or frequent urination) . *BOWEL PROBLEMS (unusual diarrhea, constipation, pain near the anus) . TENDERNESS IN MOUTH AND THROAT WITH OR WITHOUT PRESENCE OF ULCERS (sore throat, sores in mouth, or a toothache) . UNUSUAL RASH, SWELLING OR PAIN  . UNUSUAL VAGINAL DISCHARGE OR ITCHING   Items with * indicate a potential emergency and should be followed up as soon as possible or go to the Emergency Department if any problems should occur.  Please show the CHEMOTHERAPY ALERT CARD or IMMUNOTHERAPY ALERT CARD at  check-in to the Emergency Department and triage nurse.  Should you have questions after your visit or need to cancel or reschedule your appointment, please contact [facility]  Dept: [phone]  and follow the prompts.  Office hours are 8:00 a.m. to 4:30 p.m. Monday - Friday. Please note that voicemails left after 4:00 p.m. may not be returned until the following business day.  We are closed weekends and major holidays. You have access to a nurse at all times for urgent questions. Please call the main number to the clinic Dept: [phone] and follow the prompts.   For any non-urgent questions, you may also contact your provider using MyChart. We now offer e-Visits for anyone 73 and older to request care online for non-urgent symptoms. For details visit [email or web address].   Also download the MyChart app! Go to the app store, search "MyChart", open the app, select [facility], and log in with your MyChart username and password.  Due to Covid, a mask is required upon entering the hospital/clinic. If you do not have a mask, one will be given to you upon arrival. For doctor visits, patients may have 1 support person aged 35 or older with them. For treatment visits, patients cannot have anyone with them due to current Covid guidelines and our immunocompromised population.

## 2020-08-23 NOTE — Progress Notes (Signed)
[facility] Telephone:([phone]) [phone]   Fax:([phone]) [phone]  OFFICE PROGRESS NOTE  Barry Dienes, NP No address on file  DIAGNOSIS: Recurrent non-small cell lung cancer initially diagnosed as stage IIIA (T1b, N2, M0) non-small cell lung cancer presented with right middle lobe pulmonary nodule, mediastinal lymphadenopathy and highly suspicious for small nodule in the left upper lobe that could change her stage to stage IV that could present another synchronous primary lesion in the left upper lobe. This was diagnosed in September 2017.  PRIOR THERAPY:  1) Concurrent chemoradiation with weekly carboplatin for AUC of 2 and paclitaxel 45 MG/M2 status post 6 cycles last dose was given 02/25/2016 with partial response. 2) Consolidation chemotherapy with carboplatin for AUC of 5 and paclitaxel 175 MG/M2 every 3 weeks with Neulasta support. First dose 04/22/2016. Status post 3 cycles. 3) Second line treatment with immunotherapy with Nivolumab 480 mg IV every 4 weeks status post 32 cycles.  Discontinued secondary to intolerance and frequent hospitalization with pneumonia and pneumonitis.  She had a break of treatment between November 24, 2019 until June 28, 2020.  CURRENT THERAPY: Resuming her treatment with immunotherapy with nivolumab 480 mg IV every 4 weeks.  Cycle #33 started on June 28, 2020.  Status post 34 cycles.  INTERVAL HISTORY: Courtney Zuniga 70 y.o. female returns to the clinic today for follow-up visit.  The patient is feeling fine today with no concerning complaints except for the baseline shortness of breath and she is currently on home oxygen.  She denied having any chest pain, cough or hemoptysis.  She denied having any fever or chills.  She has no nausea, vomiting, diarrhea or constipation.  She was seen at the emergency department at [facility] 2 weeks ago for shortness of breath and she was treated for suspicious pneumonia.  The patient is here today for  evaluation before starting cycle #35 of her treatment.   MEDICAL HISTORY: Past Medical History:  Diagnosis Date  . Anemia    as a young woman  . Arthritis    osteoartritis  . Asthma   . Brain tumor (benign) ([place]) 2005 [facility]   Benign  . Chronic headaches   . Chronic hip pain   . Chronic pain   . COPD (chronic obstructive pulmonary disease) ([place])   . Coronary artery disease   . Depression   . Depression 05/15/2016  . Encounter for antineoplastic chemotherapy 01/10/2016  . GERD (gastroesophageal reflux disease)   . Hypertension   . Lung cancer ([place]) dx'd 01/2016   currently on chemo and radiation   . NSTEMI (non-ST elevated myocardial infarction) ([place]) yrs ago  . On home O2    qhs 2 liters at hs and prn  . Pneumonia last time 2 yrs ago  . Shortness of breath dyspnea    with activity    ALLERGIES:  has No Known Allergies.  MEDICATIONS:  Current Outpatient Medications  Medication Sig Dispense Refill  . acetaminophen (TYLENOL) 325 MG tablet Take 2 tablets (650 mg total) by mouth every 6 (six) hours as needed for mild pain, fever or headache. 12 tablet 2  . albuterol (PROVENTIL HFA;VENTOLIN HFA) 108 (90 BASE) MCG/ACT inhaler Inhale 2 puffs into the lungs every 4 (four) hours as needed for shortness of breath.     Marland Kitchen atorvastatin (LIPITOR) 10 MG tablet Take 10 mg by mouth daily.    Marland Kitchen BREO ELLIPTA 100-25 MCG/INH AEPB Inhale 1 puff into the lungs daily.     Marland Kitchen  Cholecalciferol (VITAMIN D3) 125 MCG (5000 UT) CAPS Take 1 capsule by mouth daily.  0  . cyclobenzaprine (FLEXERIL) 10 MG tablet Take 1 tablet (10 mg total) by mouth 2 (two) times daily. *MAy take one additional tablet as needed for muscle spasms (Patient taking differently: Take 10 mg by mouth 2 (two) times daily. *May take one additional tablet as needed for muscle spasms) 60 tablet 2  . dextromethorphan-guaiFENesin (MUCINEX DM) 30-600 MG 12hr tablet Take 1 tablet by mouth 2 (two) times daily. 40 tablet 0  . diclofenac sodium  (VOLTAREN) 1 % GEL Apply topically 4 (four) times daily as needed (massge gel into affected area(s) as needed for pain).   1  . DULoxetine (CYMBALTA) 30 MG capsule Take 30 mg by mouth daily.    . Dupilumab (DUPIXENT) 300 MG/2ML SOPN Inject 300 mg into the skin every 14 (fourteen) days. Twice a month    . ENSURE (ENSURE) Take 237 mLs by mouth 3 (three) times daily between meals.    . Ipratropium-Albuterol (COMBIVENT) 20-100 MCG/ACT AERS respimat Inhale 2 puffs into the lungs 4 (four) times daily as needed.    Marland Kitchen ipratropium-albuterol (DUONEB) 0.5-2.5 (3) MG/3ML SOLN Take 3 mLs by nebulization 3 (three) times daily. 360 mL 2  . metoprolol tartrate (LOPRESSOR) 25 MG tablet Take 1 tablet (25 mg total) by mouth 2 (two) times daily. 60 tablet 0  . oxyCODONE-acetaminophen (PERCOCET/ROXICET) 5-325 MG tablet Take 1 tablet by mouth every 8 (eight) hours as needed for moderate pain. 12 tablet 0  . topiramate (TOPAMAX) 25 MG tablet Take 1 tablet by mouth daily at bedtime X 1 week; then increase to 25mg  BID. (Patient taking differently: Take 25 mg by mouth 2 (two) times daily.) 45 tablet 2   No current facility-administered medications for this visit.    SURGICAL HISTORY:  Past Surgical History:  Procedure Laterality Date  . CHOLECYSTECTOMY    . COLONOSCOPY  2015   Results requested from [facility]  . COLONOSCOPY    . ESOPHAGOGASTRODUODENOSCOPY N/A 08/14/2015   Procedure: ESOPHAGOGASTRODUODENOSCOPY (EGD);  Surgeon: Daneil Dolin, MD;  Location: [facility];  Service: Endoscopy;  Laterality: N/A;  215   . ESOPHAGOGASTRODUODENOSCOPY (EGD) WITH PROPOFOL N/A 09/13/2015   Procedure: ESOPHAGOGASTRODUODENOSCOPY (EGD) WITH PROPOFOL;  Surgeon: Milus Banister, MD;  Location: [facility] ENDOSCOPY;  Service: Endoscopy;  Laterality: N/A;  . EUS N/A 03/12/2017   Procedure: UPPER ENDOSCOPIC ULTRASOUND (EUS) RADIAL;  Surgeon: Milus Banister, MD;  Location: [facility] ENDOSCOPY;  Service: Endoscopy;  Laterality: N/A;  .  TUMOR REMOVAL  2005   Benign  . UPPER ESOPHAGEAL ENDOSCOPIC ULTRASOUND (EUS)  09/13/2015   Procedure: UPPER ESOPHAGEAL ENDOSCOPIC ULTRASOUND (EUS);  Surgeon: Milus Banister, MD;  Location: [facility];  Service: Endoscopy;;  . VIDEO BRONCHOSCOPY WITH ENDOBRONCHIAL NAVIGATION N/A 12/31/2015   Procedure: VIDEO BRONCHOSCOPY WITH ENDOBRONCHIAL NAVIGATION;  Surgeon: Melrose Nakayama, MD;  Location: [facility];  Service: Thoracic;  Laterality: N/A;  . VIDEO BRONCHOSCOPY WITH ENDOBRONCHIAL ULTRASOUND N/A 11/08/2015   Procedure: VIDEO BRONCHOSCOPY WITH ENDOBRONCHIAL ULTRASOUND;  Surgeon: Ivin Poot, MD;  Location: [facility] OR;  Service: Thoracic;  Laterality: N/A;    REVIEW OF SYSTEMS:  A comprehensive review of systems was negative except for: Constitutional: positive for fatigue Respiratory: positive for cough and dyspnea on exertion   PHYSICAL EXAMINATION: General appearance: alert, cooperative, fatigued and no distress Head: Normocephalic, without obvious abnormality, atraumatic Neck: no adenopathy, no JVD, supple, symmetrical, trachea midline and thyroid not  enlarged, symmetric, no tenderness/mass/nodules Lymph nodes: Cervical, supraclavicular, and axillary nodes normal. Resp: clear to auscultation bilaterally Back: symmetric, no curvature. ROM normal. No CVA tenderness. Cardio: regular rate and rhythm, S1, S2 normal, no murmur, click, rub or gallop GI: soft, non-tender; bowel sounds normal; no masses,  no organomegaly Extremities: extremities normal, atraumatic, no cyanosis or edema  ECOG PERFORMANCE STATUS: 1 - Symptomatic but completely ambulatory  Blood pressure 116/70, pulse 87, temperature 97.8 F (36.6 C), temperature source Tympanic, resp. rate 19, height 5\' 6"  (1.676 m), weight 143 lb (64.9 kg), SpO2 93 %.  LABORATORY DATA: Lab Results  Component Value Date   WBC 5.5 08/23/2020   HGB 12.2 08/23/2020   HCT 36.1 08/23/2020   MCV 84.0 08/23/2020   PLT 334 08/23/2020       Chemistry      Component Value Date/Time   NA 145 08/23/2020 0841   NA 144 04/09/2017 0948   K 4.2 08/23/2020 0841   K 4.3 04/09/2017 0948   CL 108 08/23/2020 0841   CO2 28 08/23/2020 0841   CO2 25 04/09/2017 0948   BUN 9 08/23/2020 0841   BUN 16.8 04/09/2017 0948   CREATININE 0.66 08/23/2020 0841   CREATININE 0.8 04/09/2017 0948      Component Value Date/Time   CALCIUM 9.3 08/23/2020 0841   CALCIUM 9.1 04/09/2017 0948   ALKPHOS 271 (H) 08/23/2020 0841   ALKPHOS 216 (H) 04/09/2017 0948   AST 30 08/23/2020 0841   AST 12 04/09/2017 0948   ALT 19 08/23/2020 0841   ALT 13 04/09/2017 0948   BILITOT <0.2 (L) 08/23/2020 0841   BILITOT 0.22 04/09/2017 0948       RADIOGRAPHIC STUDIES:-   CT Angio Chest PE W and/or Wo Contrast  Result Date: 08/06/2020 CLINICAL DATA:  Evaluate for acute pulmonary embolus. History of lung cancer. EXAM: CT ANGIOGRAPHY CHEST WITH CONTRAST TECHNIQUE: Multidetector CT imaging of the chest was performed using the standard protocol during bolus administration of intravenous contrast. Multiplanar CT image reconstructions and MIPs were obtained to evaluate the vascular anatomy. CONTRAST:  119mL OMNIPAQUE IOHEXOL 350 MG/ML SOLN COMPARISON:  06/05/2020. FINDINGS: Cardiovascular: Satisfactory opacification of the pulmonary arteries to the segmental level. No evidence of pulmonary embolism. Aortic atherosclerosis. No aneurysm. Increase caliber of the main pulmonary artery measures 3.2 cm in may reflect underlying PA hypertension. Coronary artery calcifications noted. Mediastinum/Nodes: Normal appearance of the thyroid gland. The trachea appears patent and is midline. Normal appearance of the esophagus. No enlarged axillary, mediastinal, or hilar lymph nodes. Lungs/Pleura: No pleural effusion. Moderate to advanced changes of centrilobular and paraseptal emphysema. -Right upper lobe lung mass measures 3.1 x 2.7 by 4.3 cm (volume = 19 cm^3), image 33/6 and image 92/7.  Previously this measured 5.2 x 3.1 by 4.8 cm (volume = 41 cm^3). Surrounding scarring and architectural distortion is again noted. -The previously mentioned posterior right upper lobe satellite nodule measures 1.1 cm, image 32/6. Previously 1.2 cm. -Right midlung perihilar masslike architectural distortion and fibrosis is again noted compatible with post treatment changes. Unchanged appearance of complete atelectasis of the right middle lobe. -New patchy ground-glass and airspace densities within the superior segment of right lower lobe posterior to the right hilum is noted, image 91/6. This may reflect changes due to external beam radiation versus superimposed inflammatory or infectious process. -Within the superior segment of the left lower lobe there is a 6 mm nodule, image 71/6. This is new when compared with the previous exam. Upper  Abdomen: No acute abnormality. Musculoskeletal: No chest wall abnormality. No acute or significant osseous findings. Review of the MIP images confirms the above findings. IMPRESSION: 1. No evidence for acute pulmonary embolus. 2. Decrease in size of right upper lobe lung mass with posterior satellite nodule. 3. Stable appearance of post treatment changes within the right lung. 4. New patchy ground-glass and airspace densities within the superior segment of right lower lobe posterior to the right hilum is identified. This may reflect changes due to external beam radiation versus superimposed inflammatory or infectious process. 5. New 6 mm nodule within the superior segment of left lower lobe. Etiology indeterminate. Attention in this nodule on subsequent surveillance imaging is advised. 6. Emphysema and aortic atherosclerosis. Aortic Atherosclerosis (ICD10-I70.0) and Emphysema (ICD10-J43.9). Electronically Signed   By: Kerby Moors M.D.   On: 08/06/2020 16:18   DG CHEST PORT 1 VIEW  Result Date: 08/08/2020 CLINICAL DATA:  Shortness of breath. EXAM: PORTABLE CHEST 1 VIEW  COMPARISON:  CT 08/06/2020.  Chest x-ray 08/06/2020. FINDINGS: Mediastinum and hilar structures stable. Heart size stable. Stable right apical nodular density/mass. Stable post treatment changes in the right perihilar region. Slight increase in mild bibasilar atelectasis. Pulmonary nodule in the superior segment of the left lower lobe best identified by prior CT. No pleural effusion or pneumothorax. No acute bony abnormality. IMPRESSION: 1.  Right apical nodular density/mass unchanged. 2.  Stable right perihilar post treatment changes. 3.  Progressive mild bibasilar subsegmental atelectasis. 4. Pulmonary nodule in the superior segment of the left lower lobe best identified by prior CT. Electronically Signed   By: Marcello Moores  Register   On: 08/08/2020 07:25   DG Chest Portable 1 View  Result Date: 08/06/2020 CLINICAL DATA:  Shortness of breath. EXAM: PORTABLE CHEST 1 VIEW COMPARISON:  November 05, 2019. FINDINGS: The heart size and mediastinal contours are within normal limits. No pneumothorax or pleural effusion is noted. Stable bibasilar scarring is noted. Stable right perihilar opacity is noted concerning for chronic atelectasis. No definite acute abnormality is noted. The visualized skeletal structures are unremarkable. IMPRESSION: Stable chronic findings as described above. No definite acute abnormality is noted. Aortic Atherosclerosis (ICD10-I70.0). Electronically Signed   By: Marijo Conception M.D.   On: 08/06/2020 14:33    ASSESSMENT AND PLAN:  This is a very pleasant 70 years old African-American female with a recurrent non-small cell lung cancer initially diagnosed as stage IIIA non-small cell lung cancer status post concurrent chemoradiation followed by consolidation chemotherapy with carboplatin and paclitaxel for 3 cycles. The patient was followed by observation but restaging imaging studies showed evidence for disease progression. She was started on second line treatment with immunotherapy with  Nivolumab 480 mg IV every 4 weeks status post 32 cycles.   The patient has been in observation since August 2021 with no concerning complaints except for the baseline shortness of breath and intermittent headache. Recent imaging studies including CT scan of the chest scan showed development of right apical lung mass consistent with recurrent/metastatic disease.  I ordered a PET scan which was performed recently.  I personally and independently reviewed the scan images and discussed the results with the patient today. Her scan showed persistent right upper lobe hypermetabolic activity still suspicious for bronchogenic carcinoma but inflammatory process could be contributing to the hypermetabolic activity. The patient resumed her treatment with cycle number cycle #33 of single agent nivolumab 480 mg IV every 4 weeks in March 2022 status post 34 cycles. She has been tolerating this treatment  well with no concerning adverse effects. She had CT angiogram of the chest performed 2 weeks ago for evaluation of shortness of breath and that showed suspicious airspace disease but no concerning findings for disease progression. I recommended for the patient to continue her current treatment with nivolumab and she will proceed with cycle #75 today. She will come back for follow-up visit in 4 weeks for evaluation before the next cycle of her treatment. She was advised to call immediately if she has any concerning symptoms in the interval.  The patient voices understanding of current disease status and treatment options and is in agreement with the current care plan. All questions were answered. The patient knows to call the clinic with any problems, questions or concerns. We can certainly see the patient much sooner if necessary.  Disclaimer: This note was dictated with voice recognition software. Similar sounding words can inadvertently be transcribed and may not be corrected upon review.

## 2020-09-20 ENCOUNTER — Inpatient Hospital Stay: Payer: Medicare HMO

## 2020-09-20 ENCOUNTER — Other Ambulatory Visit: Payer: Self-pay

## 2020-09-20 ENCOUNTER — Other Ambulatory Visit: Payer: Self-pay | Admitting: Internal Medicine

## 2020-09-20 ENCOUNTER — Inpatient Hospital Stay: Payer: Medicare HMO | Attending: Internal Medicine | Admitting: Internal Medicine

## 2020-09-20 VITALS — BP 116/75 | HR 95 | Temp 96.4°F | Resp 18 | Ht 66.0 in | Wt 135.8 lb

## 2020-09-20 DIAGNOSIS — C3491 Malignant neoplasm of unspecified part of right bronchus or lung: Secondary | ICD-10-CM

## 2020-09-20 DIAGNOSIS — Z79899 Other long term (current) drug therapy: Secondary | ICD-10-CM | POA: Insufficient documentation

## 2020-09-20 DIAGNOSIS — Z5112 Encounter for antineoplastic immunotherapy: Secondary | ICD-10-CM | POA: Diagnosis present

## 2020-09-20 DIAGNOSIS — C342 Malignant neoplasm of middle lobe, bronchus or lung: Secondary | ICD-10-CM | POA: Insufficient documentation

## 2020-09-20 LAB — CBC WITH DIFFERENTIAL (CANCER CENTER ONLY)
Abs Immature Granulocytes: 0.02 10*3/uL (ref 0.00–0.07)
Basophils Absolute: 0 10*3/uL (ref 0.0–0.1)
Basophils Relative: 1 %
Eosinophils Absolute: 0.2 10*3/uL (ref 0.0–0.5)
Eosinophils Relative: 3 %
HCT: 41 % (ref 36.0–46.0)
Hemoglobin: 13.8 g/dL (ref 12.0–15.0)
Immature Granulocytes: 0 %
Lymphocytes Relative: 23 %
Lymphs Abs: 1.4 10*3/uL (ref 0.7–4.0)
MCH: 28.5 pg (ref 26.0–34.0)
MCHC: 33.7 g/dL (ref 30.0–36.0)
MCV: 84.5 fL (ref 80.0–100.0)
Monocytes Absolute: 0.4 10*3/uL (ref 0.1–1.0)
Monocytes Relative: 6 %
Neutro Abs: 4 10*3/uL (ref 1.7–7.7)
Neutrophils Relative %: 67 %
Platelet Count: 239 10*3/uL (ref 150–400)
RBC: 4.85 MIL/uL (ref 3.87–5.11)
RDW: 15.7 % — ABNORMAL HIGH (ref 11.5–15.5)
WBC Count: 5.9 10*3/uL (ref 4.0–10.5)
nRBC: 0 % (ref 0.0–0.2)

## 2020-09-20 LAB — CMP (CANCER CENTER ONLY)
ALT: 11 U/L (ref 0–44)
AST: 21 U/L (ref 15–41)
Albumin: 3.6 g/dL (ref 3.5–5.0)
Alkaline Phosphatase: 283 U/L — ABNORMAL HIGH (ref 38–126)
Anion gap: 12 (ref 5–15)
BUN: 15 mg/dL (ref 8–23)
CO2: 25 mmol/L (ref 22–32)
Calcium: 9.4 mg/dL (ref 8.9–10.3)
Chloride: 107 mmol/L (ref 98–111)
Creatinine: 0.72 mg/dL (ref 0.44–1.00)
GFR, Estimated: >60 mL/min (ref >60)
Glucose, Bld: 76 mg/dL (ref 70–99)
Potassium: 3.8 mmol/L (ref 3.5–5.1)
Sodium: 144 mmol/L (ref 135–145)
Total Bilirubin: <0.2 mg/dL — ABNORMAL LOW (ref 0.3–1.2)
Total Protein: 7 g/dL (ref 6.5–8.1)

## 2020-09-20 LAB — TSH: TSH: 2.135 u[IU]/mL (ref 0.308–3.960)

## 2020-09-20 MED ORDER — SODIUM CHLORIDE 0.9 % IV SOLN
480.0000 mg | Freq: Once | INTRAVENOUS | Status: AC
  Administered 2020-09-20: 480 mg via INTRAVENOUS
  Filled 2020-09-20: qty 48

## 2020-09-20 MED ORDER — SODIUM CHLORIDE 0.9 % IV SOLN
Freq: Once | INTRAVENOUS | Status: AC
  Filled 2020-09-20: qty 250

## 2020-09-20 NOTE — Patient Instructions (Signed)
[facility]  Discharge Instructions: Thank you for choosing [facility] to provide your oncology and hematology care.   If you have a lab appointment with the [facility], please go directly to the [facility] and check in at the registration area.   Wear comfortable clothing and clothing appropriate for easy access to any Portacath or PICC line.   We strive to give you quality time with your provider. You may need to reschedule your appointment if you arrive late (15 or more minutes).  Arriving late affects you and other patients whose appointments are after yours.  Also, if you miss three or more appointments without notifying the office, you may be dismissed from the clinic at the provider's discretion.      For prescription refill requests, have your pharmacy contact our office and allow 72 hours for refills to be completed.    Today you received the following chemotherapy and/or immunotherapy agents opdivo   To help prevent nausea and vomiting after your treatment, we encourage you to take your nausea medication as directed.  BELOW ARE SYMPTOMS THAT SHOULD BE REPORTED IMMEDIATELY: *FEVER GREATER THAN 100.4 F (38 C) OR HIGHER *CHILLS OR SWEATING *NAUSEA AND VOMITING THAT IS NOT CONTROLLED WITH YOUR NAUSEA MEDICATION *UNUSUAL SHORTNESS OF BREATH *UNUSUAL BRUISING OR BLEEDING *URINARY PROBLEMS (pain or burning when urinating, or frequent urination) *BOWEL PROBLEMS (unusual diarrhea, constipation, pain near the anus) TENDERNESS IN MOUTH AND THROAT WITH OR WITHOUT PRESENCE OF ULCERS (sore throat, sores in mouth, or a toothache) UNUSUAL RASH, SWELLING OR PAIN  UNUSUAL VAGINAL DISCHARGE OR ITCHING   Items with * indicate a potential emergency and should be followed up as soon as possible or go to the Emergency Department if any problems should occur.  Please show the CHEMOTHERAPY ALERT CARD or IMMUNOTHERAPY ALERT CARD at check-in to the  Emergency Department and triage nurse.  Should you have questions after your visit or need to cancel or reschedule your appointment, please contact [facility]  Dept: [phone]  and follow the prompts.  Office hours are 8:00 a.m. to 4:30 p.m. Monday - Friday. Please note that voicemails left after 4:00 p.m. may not be returned until the following business day.  We are closed weekends and major holidays. You have access to a nurse at all times for urgent questions. Please call the main number to the clinic Dept: [phone] and follow the prompts.   For any non-urgent questions, you may also contact your provider using MyChart. We now offer e-Visits for anyone 78 and older to request care online for non-urgent symptoms. For details visit [email or web address].   Also download the MyChart app! Go to the app store, search "MyChart", open the app, select [facility], and log in with your MyChart username and password.  Due to Covid, a mask is required upon entering the hospital/clinic. If you do not have a mask, one will be given to you upon arrival. For doctor visits, patients may have 1 support person aged 58 or older with them. For treatment visits, patients cannot have anyone with them due to current Covid guidelines and our immunocompromised population.

## 2020-09-20 NOTE — Progress Notes (Signed)
[facility] [phone]   [phone]  OFFICE PROGRESS NOTE  Barry Dienes, NP No address on file  DIAGNOSIS: Recurrent non-small cell lung cancer initially diagnosed as stage IIIA (T1b, N2, M0) non-small cell lung cancer presented with right middle lobe pulmonary nodule, mediastinal lymphadenopathy and highly suspicious for small nodule in the left upper lobe that could change her stage to stage IV that could present another synchronous primary lesion in the left upper lobe. This was diagnosed in September 2017.  PRIOR THERAPY:  1) Concurrent chemoradiation with weekly carboplatin for AUC of 2 and paclitaxel 45 MG/M2 status post 6 cycles last dose was given 02/25/2016 with partial response. 2) Consolidation chemotherapy with carboplatin for AUC of 5 and paclitaxel 175 MG/M2 every 3 weeks with Neulasta support. First dose 04/22/2016. Status post 3 cycles. 3) Second line treatment with immunotherapy with Nivolumab 480 mg IV every 4 weeks status post 32 cycles.  Discontinued secondary to intolerance and frequent hospitalization with pneumonia and pneumonitis.  She had a break of treatment between November 24, 2019 until June 28, 2020.  CURRENT THERAPY: Resuming her treatment with immunotherapy with nivolumab 480 mg IV every 4 weeks.  Cycle #33 started on June 28, 2020.  Status post 35 cycles.  INTERVAL HISTORY: Courtney Zuniga 70 y.o. female returns to the clinic today for follow-up visit.  The patient is feeling fine today with no concerning complaints except for the baseline shortness of breath and she is currently on home oxygen.  She denied having any chest pain, cough or hemoptysis.  She denied having any fever or chills.  She has no nausea, vomiting, diarrhea or constipation.  She denied having any headache or visual changes.  She continues to tolerate her treatment with nivolumab fairly well.  The patient is here today for evaluation before starting cycle  #36   MEDICAL HISTORY: Past Medical History:  Diagnosis Date   Anemia    as a young woman   Arthritis    osteoartritis   Asthma    Brain tumor (benign) ([place]) 2005 [facility]   Benign   Chronic headaches    Chronic hip pain    Chronic pain    COPD (chronic obstructive pulmonary disease) ([facility])    Coronary artery disease    Depression    Depression 05/15/2016   Encounter for antineoplastic chemotherapy 01/10/2016   GERD (gastroesophageal reflux disease)    Hypertension    Lung cancer ([place]) dx'd 01/2016   currently on chemo and radiation    NSTEMI (non-ST elevated myocardial infarction) ([place]) yrs ago   On home O2    qhs 2 liters at hs and prn   Pneumonia last time 2 yrs ago   Shortness of breath dyspnea    with activity    ALLERGIES:  has No Known Allergies.  MEDICATIONS:  Current Outpatient Medications  Medication Sig Dispense Refill   acetaminophen (TYLENOL) 325 MG tablet Take 2 tablets (650 mg total) by mouth every 6 (six) hours as needed for mild pain, fever or headache. 12 tablet 2   albuterol (PROVENTIL HFA;VENTOLIN HFA) 108 (90 BASE) MCG/ACT inhaler Inhale 2 puffs into the lungs every 4 (four) hours as needed for shortness of breath.      atorvastatin (LIPITOR) 10 MG tablet Take 10 mg by mouth daily.     BREO ELLIPTA 100-25 MCG/INH AEPB Inhale 1 puff into the lungs daily.      Cholecalciferol (VITAMIN D3) 125 MCG (5000  UT) CAPS Take 1 capsule by mouth daily.  0   cyclobenzaprine (FLEXERIL) 10 MG tablet Take 1 tablet (10 mg total) by mouth 2 (two) times daily. *MAy take one additional tablet as needed for muscle spasms (Patient taking differently: Take 10 mg by mouth 2 (two) times daily. *May take one additional tablet as needed for muscle spasms) 60 tablet 2   dextromethorphan-guaiFENesin (MUCINEX DM) 30-600 MG 12hr tablet Take 1 tablet by mouth 2 (two) times daily. 40 tablet 0   diclofenac sodium (VOLTAREN) 1 % GEL Apply topically 4 (four) times daily as needed (massge gel  into affected area(s) as needed for pain).   1   DULoxetine (CYMBALTA) 30 MG capsule Take 30 mg by mouth daily.     Dupilumab (DUPIXENT) 300 MG/2ML SOPN Inject 300 mg into the skin every 14 (fourteen) days. Twice a month     ENSURE (ENSURE) Take 237 mLs by mouth 3 (three) times daily between meals.     Ipratropium-Albuterol (COMBIVENT) 20-100 MCG/ACT AERS respimat Inhale 2 puffs into the lungs 4 (four) times daily as needed.     ipratropium-albuterol (DUONEB) 0.5-2.5 (3) MG/3ML SOLN Take 3 mLs by nebulization 3 (three) times daily. 360 mL 2   metoprolol tartrate (LOPRESSOR) 25 MG tablet Take 1 tablet (25 mg total) by mouth 2 (two) times daily. 60 tablet 0   oxyCODONE-acetaminophen (PERCOCET/ROXICET) 5-325 MG tablet Take 1 tablet by mouth every 8 (eight) hours as needed for moderate pain. 12 tablet 0   topiramate (TOPAMAX) 25 MG tablet Take 1 tablet by mouth daily at bedtime X 1 week; then increase to 25mg  BID. (Patient taking differently: Take 25 mg by mouth 2 (two) times daily.) 45 tablet 2   No current facility-administered medications for this visit.    SURGICAL HISTORY:  Past Surgical History:  Procedure Laterality Date   CHOLECYSTECTOMY     COLONOSCOPY  2015   [facility] requested from [facility]   COLONOSCOPY     ESOPHAGOGASTRODUODENOSCOPY N/A 08/14/2015   Procedure: ESOPHAGOGASTRODUODENOSCOPY (EGD);  Surgeon: Daneil Dolin, MD;  Location: [facility];  Service: Endoscopy;  Laterality: N/A;  215    ESOPHAGOGASTRODUODENOSCOPY (EGD) WITH PROPOFOL N/A 09/13/2015   Procedure: ESOPHAGOGASTRODUODENOSCOPY (EGD) WITH PROPOFOL;  Surgeon: Milus Banister, MD;  Location: [facility];  Service: Endoscopy;  Laterality: N/A;   EUS N/A 03/12/2017   Procedure: UPPER ENDOSCOPIC ULTRASOUND (EUS) RADIAL;  Surgeon: Milus Banister, MD;  Location: [facility];  Service: Endoscopy;  Laterality: N/A;   TUMOR REMOVAL  2005   Benign   UPPER ESOPHAGEAL ENDOSCOPIC ULTRASOUND (EUS)  09/13/2015    Procedure: UPPER ESOPHAGEAL ENDOSCOPIC ULTRASOUND (EUS);  Surgeon: Milus Banister, MD;  Location: [facility];  Service: Endoscopy;;   VIDEO BRONCHOSCOPY WITH ENDOBRONCHIAL NAVIGATION N/A 12/31/2015   Procedure: VIDEO BRONCHOSCOPY WITH ENDOBRONCHIAL NAVIGATION;  Surgeon: Melrose Nakayama, MD;  Location: [facility];  Service: Thoracic;  Laterality: N/A;   VIDEO BRONCHOSCOPY WITH ENDOBRONCHIAL ULTRASOUND N/A 11/08/2015   Procedure: VIDEO BRONCHOSCOPY WITH ENDOBRONCHIAL ULTRASOUND;  Surgeon: Ivin Poot, MD;  Location: [facility];  Service: Thoracic;  Laterality: N/A;    REVIEW OF SYSTEMS:  Respiratory: positive for dyspnea on exertion   PHYSICAL EXAMINATION: General appearance: alert, cooperative, fatigued, and no distress Head: Normocephalic, without obvious abnormality, atraumatic Neck: no adenopathy, no JVD, supple, symmetrical, trachea midline, and thyroid not enlarged, symmetric, no tenderness/mass/nodules Lymph nodes: Cervical, supraclavicular, and axillary nodes normal. Resp: clear to auscultation bilaterally Back: symmetric, no curvature.  ROM normal. No CVA tenderness. Cardio: regular rate and rhythm, S1, S2 normal, no murmur, click, rub or gallop GI: soft, non-tender; bowel sounds normal; no masses,  no organomegaly Extremities: extremities normal, atraumatic, no cyanosis or edema  ECOG PERFORMANCE STATUS: 1 - Symptomatic but completely ambulatory  Blood pressure 116/75, pulse 95, temperature (!) 96.4 F (35.8 C), temperature source Tympanic, resp. rate 18, height 5\' 6"  (1.676 m), weight 135 lb 12.8 oz (61.6 kg), SpO2 94 %.  LABORATORY DATA: Lab Results  Component Value Date   WBC 5.9 09/20/2020   HGB 13.8 09/20/2020   HCT 41.0 09/20/2020   MCV 84.5 09/20/2020   PLT 239 09/20/2020      Chemistry      Component Value Date/Time   NA 145 08/23/2020 0841   NA 144 04/09/2017 0948   K 4.2 08/23/2020 0841   K 4.3 04/09/2017 0948   CL 108 08/23/2020 0841   CO2 28 08/23/2020  0841   CO2 25 04/09/2017 0948   BUN 9 08/23/2020 0841   BUN 16.8 04/09/2017 0948   CREATININE 0.66 08/23/2020 0841   CREATININE 0.8 04/09/2017 0948      Component Value Date/Time   CALCIUM 9.3 08/23/2020 0841   CALCIUM 9.1 04/09/2017 0948   ALKPHOS 271 (H) 08/23/2020 0841   ALKPHOS 216 (H) 04/09/2017 0948   AST 30 08/23/2020 0841   AST 12 04/09/2017 0948   ALT 19 08/23/2020 0841   ALT 13 04/09/2017 0948   BILITOT <0.2 (L) 08/23/2020 0841   BILITOT 0.22 04/09/2017 0948       RADIOGRAPHIC STUDIES:-   No results found.   ASSESSMENT AND PLAN:  This is a very pleasant 70 years old African-American female with a recurrent non-small cell lung cancer initially diagnosed as stage IIIA non-small cell lung cancer status post concurrent chemoradiation followed by consolidation chemotherapy with carboplatin and paclitaxel for 3 cycles. The patient was followed by observation but restaging imaging studies showed evidence for disease progression. She was started on second line treatment with immunotherapy with Nivolumab 480 mg IV every 4 weeks status post 32 cycles.   The patient has been in observation since August 2021 with no concerning complaints except for the baseline shortness of breath and intermittent headache. Recent imaging studies including CT scan of the chest scan showed development of right apical lung mass consistent with recurrent/metastatic disease.  I ordered a PET scan which was performed recently.  I personally and independently reviewed the scan images and discussed the results with the patient today. Her scan showed persistent right upper lobe hypermetabolic activity still suspicious for bronchogenic carcinoma but inflammatory process could be contributing to the hypermetabolic activity. The patient resumed her treatment with cycle number cycle #33 of single agent nivolumab 480 mg IV every 4 weeks in March 2022 status post 35 cycles. She has been tolerating this treatment  well with no concerning adverse effects. I recommended for the patient to proceed with cycle #36 today as planned. I will see her back for follow-up visit in 4 weeks for evaluation before starting the next cycle of her treatment. I will consider her for repeat imaging studies after the next cycle of her treatment. The patient was advised to call immediately if she has any other concerning symptoms in the interval.  The patient voices understanding of current disease status and treatment options and is in agreement with the current care plan. All questions were answered. The patient knows to call the clinic with any  problems, questions or concerns. We can certainly see the patient much sooner if necessary.  Disclaimer: This note was dictated with voice recognition software. Similar sounding words can inadvertently be transcribed and may not be corrected upon review.

## 2020-09-25 ENCOUNTER — Telehealth: Payer: Self-pay | Admitting: Internal Medicine

## 2020-09-25 NOTE — Telephone Encounter (Signed)
Scheduled per los. Called and left msg. Mailed printout  °

## 2020-10-17 NOTE — Progress Notes (Addendum)
[place] OFFICE PROGRESS NOTE  Barry Dienes, NP No address on file  DIAGNOSIS: Recurrent non-small cell lung cancer initially diagnosed as stage IIIA (T1b, N2, M0) non-small cell lung cancer presented with right middle lobe pulmonary nodule, mediastinal lymphadenopathy and highly suspicious for small nodule in the left upper lobe that could change her stage to stage IV that could present another synchronous primary lesion in the left upper lobe. This was diagnosed in September 2017.  PRIOR THERAPY: 1) Concurrent chemoradiation with weekly carboplatin for AUC of 2 and paclitaxel 45 MG/M2 status post 6 cycles last dose was given 02/25/2016 with partial response. 2) Consolidation chemotherapy with carboplatin for AUC of 5 and paclitaxel 175 MG/M2 every 3 weeks with Neulasta support. First dose 04/22/2016. Status post 3 cycles. 3) Second line treatment with immunotherapy with Nivolumab 480 mg IV every 4 weeks status post 32 cycles.  Discontinued secondary to intolerance and frequent hospitalization with pneumonia and pneumonitis.  She had a break of treatment between November 24, 2019 until June 28, 2020.  CURRENT THERAPY:  Resuming her treatment with immunotherapy with nivolumab 480 mg IV every 4 weeks.  Cycle #33 started on June 28, 2020.  Status post 36 cycles.  INTERVAL HISTORY: Courtney Zuniga 70 y.o. female returns to the clinic for a follow up visit. The patient is feeling well today without any concerning complaints except for worsening headaches over the past 3 months. She states the headaches cover her entire head and are essentially constant. She started with Tylenol to relieve the headaches although it was ineffective. The patient then started taking BC powders and for the last 3 weeks she reports taking two a day. She states these will only provide 2-3 hours of relief from her headaches. She has had chronic headaches since having a brain tumor removed in 2005 although they  were tolerable until this recent change. She denies any vision changes. She denies seizures or balance changes.   She reports her baseline shortness of breath as she wears 3 liters of oxygen via nasal cannula at all times. She reports a worsening cough for which she has been using Robitussin although her other providers advised her to discontinue this due to her blood pressure, reportedly. She would like another prescription for a different cough medication. She has been using her inhalers as prescribed. She reports some fatigue and a decreased appetite. She states she stays very hydrated drinking water but can only eat small amounts. Her only pain aside from the headaches is her chronic hip/leg pain for which she takes Percocet as needed. The patient continues to tolerate treatment with immunotherapy well without any adverse effects. Denies any fever, chills, night sweats, or weight loss. Denies any chest pain or hemoptysis. Denies any nausea, vomiting, diarrhea, or constipation. Denies any rashes or skin changes. The patient is here today for evaluation prior to starting cycle # 37  MEDICAL HISTORY: Past Medical History:  Diagnosis Date   Anemia    as a young woman   Arthritis    osteoartritis   Asthma    Brain tumor (benign) ([place]) 2005 [facility]   Benign   Chronic headaches    Chronic hip pain    Chronic pain    COPD (chronic obstructive pulmonary disease) ([place])    Coronary artery disease    Depression    Depression 05/15/2016   Encounter for antineoplastic chemotherapy 01/10/2016   GERD (gastroesophageal reflux disease)    Hypertension    Lung cancer ([place])  dx'd 01/2016   currently on chemo and radiation    NSTEMI (non-ST elevated myocardial infarction) ([place]) yrs ago   On home O2    qhs 2 liters at hs and prn   Pneumonia last time 2 yrs ago   Shortness of breath dyspnea    with activity    ALLERGIES:  has No Known Allergies.  MEDICATIONS:  Current Outpatient Medications   Medication Sig Dispense Refill   acetaminophen (TYLENOL) 325 MG tablet Take 2 tablets (650 mg total) by mouth every 6 (six) hours as needed for mild pain, fever or headache. 12 tablet 2   albuterol (PROVENTIL HFA;VENTOLIN HFA) 108 (90 BASE) MCG/ACT inhaler Inhale 2 puffs into the lungs every 4 (four) hours as needed for shortness of breath.      atorvastatin (LIPITOR) 10 MG tablet Take 10 mg by mouth daily.     BREO ELLIPTA 100-25 MCG/INH AEPB Inhale 1 puff into the lungs daily.      Cholecalciferol (VITAMIN D3) 125 MCG (5000 UT) CAPS Take 1 capsule by mouth daily.  0   cyclobenzaprine (FLEXERIL) 10 MG tablet Take 1 tablet (10 mg total) by mouth 2 (two) times daily. *MAy take one additional tablet as needed for muscle spasms (Patient taking differently: Take 10 mg by mouth 2 (two) times daily. *May take one additional tablet as needed for muscle spasms) 60 tablet 2   dextromethorphan-guaiFENesin (MUCINEX DM) 30-600 MG 12hr tablet Take 1 tablet by mouth 2 (two) times daily. 40 tablet 0   diclofenac sodium (VOLTAREN) 1 % GEL Apply topically 4 (four) times daily as needed (massge gel into affected area(s) as needed for pain).   1   DULoxetine (CYMBALTA) 30 MG capsule Take 30 mg by mouth daily.     Dupilumab (DUPIXENT) 300 MG/2ML SOPN Inject 300 mg into the skin every 14 (fourteen) days. Twice a month     ENSURE (ENSURE) Take 237 mLs by mouth 3 (three) times daily between meals.     HYDROcodone bit-homatropine (HYCODAN) 5-1.5 MG/5ML syrup Take 5 mLs by mouth every 6 (six) hours as needed for cough. 120 mL 0   Ipratropium-Albuterol (COMBIVENT) 20-100 MCG/ACT AERS respimat Inhale 2 puffs into the lungs 4 (four) times daily as needed.     ipratropium-albuterol (DUONEB) 0.5-2.5 (3) MG/3ML SOLN Take 3 mLs by nebulization 3 (three) times daily. 360 mL 2   oxyCODONE-acetaminophen (PERCOCET/ROXICET) 5-325 MG tablet Take 1 tablet by mouth every 8 (eight) hours as needed for moderate pain. 12 tablet 0    topiramate (TOPAMAX) 25 MG tablet Take 1 tablet by mouth daily at bedtime X 1 week; then increase to 25mg  BID. (Patient taking differently: Take 25 mg by mouth 2 (two) times daily.) 45 tablet 2   metoprolol tartrate (LOPRESSOR) 25 MG tablet Take 1 tablet (25 mg total) by mouth 2 (two) times daily. 60 tablet 0   No current facility-administered medications for this visit.    SURGICAL HISTORY:  Past Surgical History:  Procedure Laterality Date   CHOLECYSTECTOMY     COLONOSCOPY  2015   Results requested from [facility]   COLONOSCOPY     ESOPHAGOGASTRODUODENOSCOPY N/A 08/14/2015   Procedure: ESOPHAGOGASTRODUODENOSCOPY (EGD);  Surgeon: Daneil Dolin, MD;  Location: [facility];  Service: Endoscopy;  Laterality: N/A;  215    ESOPHAGOGASTRODUODENOSCOPY (EGD) WITH PROPOFOL N/A 09/13/2015   Procedure: ESOPHAGOGASTRODUODENOSCOPY (EGD) WITH PROPOFOL;  Surgeon: Milus Banister, MD;  Location: [facility];  Service: Endoscopy;  Laterality: N/A;  EUS N/A 03/12/2017   Procedure: UPPER ENDOSCOPIC ULTRASOUND (EUS) RADIAL;  Surgeon: Milus Banister, MD;  Location: [facility];  Service: Endoscopy;  Laterality: N/A;   TUMOR REMOVAL  2005   Benign   UPPER ESOPHAGEAL ENDOSCOPIC ULTRASOUND (EUS)  09/13/2015   Procedure: UPPER ESOPHAGEAL ENDOSCOPIC ULTRASOUND (EUS);  Surgeon: Milus Banister, MD;  Location: [facility];  Service: Endoscopy;;   VIDEO BRONCHOSCOPY WITH ENDOBRONCHIAL NAVIGATION N/A 12/31/2015   Procedure: VIDEO BRONCHOSCOPY WITH ENDOBRONCHIAL NAVIGATION;  Surgeon: Melrose Nakayama, MD;  Location: [place];  Service: Thoracic;  Laterality: N/A;   VIDEO BRONCHOSCOPY WITH ENDOBRONCHIAL ULTRASOUND N/A 11/08/2015   Procedure: VIDEO BRONCHOSCOPY WITH ENDOBRONCHIAL ULTRASOUND;  Surgeon: Ivin Poot, MD;  Location: [facility];  Service: Thoracic;  Laterality: N/A;    REVIEW OF SYSTEMS:   Review of Systems  Constitutional: Positive for appetite change and fatigue. Negative for appetite  change, chills, fatigue, fever and unexpected weight change.  HENT: Negative for mouth sores, nosebleeds, sore throat and trouble swallowing.   Eyes: Negative for eye problems and icterus.  Respiratory: Positive cough and shortness of breath. Negative for hemoptysis and wheezing.   Cardiovascular: Negative for chest pain and leg swelling.  Gastrointestinal: Negative for abdominal pain, constipation, diarrhea, nausea and vomiting.  Genitourinary: Negative for bladder incontinence, difficulty urinating, dysuria, frequency and hematuria.   Musculoskeletal: Negative for back pain, gait problem, neck pain and neck stiffness.  Skin: Negative for itching and rash.  Neurological: Positive headaches. Negative for dizziness, extremity weakness, gait problem, light-headedness and seizures.  Hematological: Negative for adenopathy. Does not bruise/bleed easily.  Psychiatric/Behavioral: Negative for confusion, depression and sleep disturbance. The patient is not nervous/anxious.     PHYSICAL EXAMINATION:  Blood pressure 128/90, pulse 82, temperature (!) 97.2 F (36.2 C), temperature source Tympanic, resp. rate 20, height 5\' 6"  (1.676 m), weight 136 lb 11.2 oz (62 kg), SpO2 92 %.  ECOG PERFORMANCE STATUS: 1  Physical Exam  Constitutional: Oriented to person, place, and time and well-developed, well-nourished, and in no distress.  HENT:  Head: Normocephalic and atraumatic.  Mouth/Throat: Oropharynx is clear and moist. No oropharyngeal exudate.  Eyes: Conjunctivae are normal. Right eye exhibits no discharge. Left eye exhibits no discharge. No scleral icterus.  Neck: Normal range of motion. Neck supple.  Cardiovascular: Normal rate, regular rhythm, normal heart sounds and intact distal pulses.   Pulmonary/Chest: Expiratory wheezing greater the right than the left. Effort slightly increased. No rales.  Abdominal: Soft. Bowel sounds are normal. Exhibits no distension and no mass. There is no tenderness.   Musculoskeletal: Normal range of motion. Exhibits no edema.  Lymphadenopathy: No cervical adenopathy.  Neurological: Alert and oriented to person, place, and time. Exhibits normal muscle tone. Gait normal. Coordination normal.  Skin: Skin is warm and dry. No rash noted. Not diaphoretic. No erythema. No pallor.  Psychiatric: Mood, memory and judgment normal.  Vitals reviewed.  LABORATORY DATA: Lab Results  Component Value Date   WBC 4.0 10/18/2020   HGB 14.2 10/18/2020   HCT 41.3 10/18/2020   MCV 83.8 10/18/2020   PLT 281 10/18/2020      Chemistry      Component Value Date/Time   NA 144 10/18/2020 0902   NA 144 04/09/2017 0948   K 4.1 10/18/2020 0902   K 4.3 04/09/2017 0948   CL 102 10/18/2020 0902   CO2 31 10/18/2020 0902   CO2 25 04/09/2017 0948   BUN 10 10/18/2020 0902   BUN 16.8 04/09/2017 0948  CREATININE 0.65 10/18/2020 0902   CREATININE 0.8 04/09/2017 0948      Component Value Date/Time   CALCIUM 10.1 10/18/2020 0902   CALCIUM 9.1 04/09/2017 0948   ALKPHOS 504 (H) 10/18/2020 0902   ALKPHOS 216 (H) 04/09/2017 0948   AST 98 (H) 10/18/2020 0902   AST 12 04/09/2017 0948   ALT 108 (H) 10/18/2020 0902   ALT 13 04/09/2017 0948   BILITOT 0.2 (L) 10/18/2020 0902   BILITOT 0.22 04/09/2017 0948       RADIOGRAPHIC STUDIES:  No results found.   ASSESSMENT/PLAN:  This is a very pleasant 70 year old African-American female with recurrent metastatic non-small cell lung cancer, adenocarcinoma of the right middle lobe.  She was initially diagnosed as stage IIIa in September 2017.    She completed concurrent chemoradiation followed by consolidation with carboplatin and paclitaxel. She is status post 3 cycles. The patient was on observation but a restaging CT scan showed evidence of disease progression.   She is currently undergoing treatment with second line with immunotherapy with nivolumab 480 mg IV every 4 weeks.  She was status post 31 cycles. Her treatment had been  on hold since August 2021 until her COPD can get under control. She showed evidence of some recurrent disease in March 2022 and she resumed her treatment with cycle number cycle #33 of single agent nivolumab 480 mg IV every 4 weeks in March 2022 status post 36 cycles.  The patient was seen with Dr. Julien Nordmann today. Labs were reviewed. Recommend that she proceed with cycle #37 today as scheduled.   We will see her back for a follow up visit in 4 weeks for evaluation before starting cycle #38.   I will arrange for a restaging CT scan of the chest, abdomen, and pelvis prior to starting her next cycle of treatment.   Regarding the patient's headaches I will arrange for a MRI of her brain to have completed as soon as possible. She was counseled on the risks of frequent and use of NSAIDs including GI discomfort leading to gastritis and increased risk of ulcers.   For the patient's cough she will be prescribed Hycodan to take as needed. She was counseled on the need to take this medication at least a few hours a apart from taking her Percocet as they are similar and could lead to a higher chance of adverse effects.   Her LFTs are elevated today. This may be due to the excessive tylenol use. However, we will order an ultrasound of the liver to assess for other etiologies.   The patient was advised to call immediately if she has any concerning symptoms in the interval. The patient voices understanding of current disease status and treatment options and is in agreement with the current care plan. All questions were answered. The patient knows to call the clinic with any problems, questions or concerns. We can certainly see the patient much sooner if necessary      Orders Placed This Encounter  Procedures   MR Brain W Wo Contrast    Standing Status:   Future    Standing Expiration Date:   10/18/2021    Order Specific Question:   If indicated for the ordered procedure, I authorize the administration of  contrast media per Radiology protocol    Answer:   Yes    Order Specific Question:   What is the patient's sedation requirement?    Answer:   No Sedation    Order Specific Question:  Does the patient have a pacemaker or implanted devices?    Answer:   No    Order Specific Question:   Use SRS Protocol?    Answer:   No    Order Specific Question:   Preferred imaging location?    Answer:   [facility] (table limit - 550 lbs)   CT Chest W Contrast    Standing Status:   Future    Standing Expiration Date:   10/18/2021    Order Specific Question:   If indicated for the ordered procedure, I authorize the administration of contrast media per Radiology protocol    Answer:   Yes    Order Specific Question:   Preferred imaging location?    Answer:   [facility]   CT Abdomen Pelvis W Contrast    Standing Status:   Future    Standing Expiration Date:   10/18/2021    Order Specific Question:   If indicated for the ordered procedure, I authorize the administration of contrast media per Radiology protocol    Answer:   Yes    Order Specific Question:   Preferred imaging location?    Answer:   [facility]    Order Specific Question:   Is Oral Contrast requested for this exam?    Answer:   Yes, Per Radiology protocol      The total time spent in the appointment was 30-39 minutes in this encounter.   Awab Abebe L Riot Waterworth, PA-C 10/18/20

## 2020-10-18 ENCOUNTER — Inpatient Hospital Stay: Payer: Medicare HMO

## 2020-10-18 ENCOUNTER — Other Ambulatory Visit: Payer: Self-pay | Admitting: Physician Assistant

## 2020-10-18 ENCOUNTER — Encounter: Payer: Self-pay | Admitting: Physician Assistant

## 2020-10-18 ENCOUNTER — Other Ambulatory Visit: Payer: Self-pay

## 2020-10-18 ENCOUNTER — Inpatient Hospital Stay: Payer: Medicare HMO | Attending: Physician Assistant | Admitting: Physician Assistant

## 2020-10-18 VITALS — BP 128/90 | HR 82 | Temp 97.2°F | Resp 20 | Ht 66.0 in | Wt 136.7 lb

## 2020-10-18 DIAGNOSIS — R519 Headache, unspecified: Secondary | ICD-10-CM

## 2020-10-18 DIAGNOSIS — R918 Other nonspecific abnormal finding of lung field: Secondary | ICD-10-CM

## 2020-10-18 DIAGNOSIS — C342 Malignant neoplasm of middle lobe, bronchus or lung: Secondary | ICD-10-CM | POA: Insufficient documentation

## 2020-10-18 DIAGNOSIS — C3491 Malignant neoplasm of unspecified part of right bronchus or lung: Secondary | ICD-10-CM

## 2020-10-18 DIAGNOSIS — C3490 Malignant neoplasm of unspecified part of unspecified bronchus or lung: Secondary | ICD-10-CM

## 2020-10-18 DIAGNOSIS — Z5112 Encounter for antineoplastic immunotherapy: Secondary | ICD-10-CM | POA: Diagnosis not present

## 2020-10-18 DIAGNOSIS — Z79899 Other long term (current) drug therapy: Secondary | ICD-10-CM | POA: Diagnosis not present

## 2020-10-18 DIAGNOSIS — R7989 Other specified abnormal findings of blood chemistry: Secondary | ICD-10-CM

## 2020-10-18 LAB — CBC WITH DIFFERENTIAL (CANCER CENTER ONLY)
Abs Immature Granulocytes: 0.03 10*3/uL (ref 0.00–0.07)
Basophils Absolute: 0 10*3/uL (ref 0.0–0.1)
Basophils Relative: 0 %
Eosinophils Absolute: 0.1 10*3/uL (ref 0.0–0.5)
Eosinophils Relative: 2 %
HCT: 41.3 % (ref 36.0–46.0)
Hemoglobin: 14.2 g/dL (ref 12.0–15.0)
Immature Granulocytes: 1 %
Lymphocytes Relative: 33 %
Lymphs Abs: 1.3 10*3/uL (ref 0.7–4.0)
MCH: 28.8 pg (ref 26.0–34.0)
MCHC: 34.4 g/dL (ref 30.0–36.0)
MCV: 83.8 fL (ref 80.0–100.0)
Monocytes Absolute: 0.4 10*3/uL (ref 0.1–1.0)
Monocytes Relative: 10 %
Neutro Abs: 2.2 10*3/uL (ref 1.7–7.7)
Neutrophils Relative %: 54 %
Platelet Count: 281 10*3/uL (ref 150–400)
RBC: 4.93 MIL/uL (ref 3.87–5.11)
RDW: 15.6 % — ABNORMAL HIGH (ref 11.5–15.5)
WBC Count: 4 10*3/uL (ref 4.0–10.5)
nRBC: 0 % (ref 0.0–0.2)

## 2020-10-18 LAB — CMP (CANCER CENTER ONLY)
ALT: 108 U/L — ABNORMAL HIGH (ref 0–44)
AST: 98 U/L — ABNORMAL HIGH (ref 15–41)
Albumin: 3.5 g/dL (ref 3.5–5.0)
Alkaline Phosphatase: 504 U/L — ABNORMAL HIGH (ref 38–126)
Anion gap: 11 (ref 5–15)
BUN: 10 mg/dL (ref 8–23)
CO2: 31 mmol/L (ref 22–32)
Calcium: 10.1 mg/dL (ref 8.9–10.3)
Chloride: 102 mmol/L (ref 98–111)
Creatinine: 0.65 mg/dL (ref 0.44–1.00)
GFR, Estimated: >60 mL/min (ref >60)
Glucose, Bld: 73 mg/dL (ref 70–99)
Potassium: 4.1 mmol/L (ref 3.5–5.1)
Sodium: 144 mmol/L (ref 135–145)
Total Bilirubin: 0.2 mg/dL — ABNORMAL LOW (ref 0.3–1.2)
Total Protein: 7.7 g/dL (ref 6.5–8.1)

## 2020-10-18 LAB — TSH: TSH: 3.822 u[IU]/mL (ref 0.308–3.960)

## 2020-10-18 MED ORDER — HYDROCODONE BIT-HOMATROP MBR 5-1.5 MG/5ML PO SOLN: 5.0000 mL | Freq: Four times a day (QID) | ORAL | 0 refills | Status: DC | PRN

## 2020-10-18 MED ORDER — SODIUM CHLORIDE 0.9 % IV SOLN
480.0000 mg | Freq: Once | INTRAVENOUS | Status: AC
  Administered 2020-10-18: 480 mg via INTRAVENOUS
  Filled 2020-10-18: qty 48

## 2020-10-18 MED ORDER — SODIUM CHLORIDE 0.9 % IV SOLN
Freq: Once | INTRAVENOUS | Status: AC
  Filled 2020-10-18: qty 250

## 2020-10-18 NOTE — Patient Instructions (Signed)
Nivolumab injection What is this medication? NIVOLUMAB (nye VOL ue mab) is a monoclonal antibody. It treats certain types of cancer. Some of the cancers treated are colon cancer, head and neck cancer,Hodgkin lymphoma, lung cancer, and melanoma. This medicine may be used for other purposes; ask your health care provider orpharmacist if you have questions. COMMON BRAND NAME(S): Opdivo What should I tell my care team before I take this medication? They need to know if you have any of these conditions: autoimmune diseases like Crohn's disease, ulcerative colitis, or lupus have had or planning to have an allogeneic stem cell transplant (uses someone else's stem cells) history of chest radiation history of organ transplant nervous system problems like myasthenia gravis or Guillain-Barre syndrome an unusual or allergic reaction to nivolumab, other medicines, foods, dyes, or preservatives pregnant or trying to get pregnant breast-feeding How should I use this medication? This medicine is for infusion into a vein. It is given by a health careprofessional in a hospital or clinic setting. A special MedGuide will be given to you before each treatment. Be sure to readthis information carefully each time. Talk to your pediatrician regarding the use of this medicine in children. While this drug may be prescribed for children as young as 12 years for selectedconditions, precautions do apply. Overdosage: If you think you have taken too much of this medicine contact apoison control center or emergency room at once. NOTE: This medicine is only for you. Do not share this medicine with others. What if I miss a dose? It is important not to miss your dose. Call your doctor or health careprofessional if you are unable to keep an appointment. What may interact with this medication? Interactions have not been studied. This list may not describe all possible interactions. Give your health care provider a list of all  the medicines, herbs, non-prescription drugs, or dietary supplements you use. Also tell them if you smoke, drink alcohol, or use illegaldrugs. Some items may interact with your medicine. What should I watch for while using this medication? This drug may make you feel generally unwell. Continue your course of treatmenteven though you feel ill unless your doctor tells you to stop. You may need blood work done while you are taking this medicine. Do not become pregnant while taking this medicine or for 5 months after stopping it. Women should inform their doctor if they wish to become pregnant or think they might be pregnant. There is a potential for serious side effects to an unborn child. Talk to your health care professional or pharmacist for more information. Do not breast-feed an infant while taking this medicine orfor 5 months after stopping it. What side effects may I notice from receiving this medication? Side effects that you should report to your doctor or health care professionalas soon as possible: allergic reactions like skin rash, itching or hives, swelling of the face, lips, or tongue breathing problems blood in the urine bloody or watery diarrhea or black, tarry stools changes in emotions or moods changes in vision chest pain cough dizziness feeling faint or lightheaded, falls fever, chills headache with fever, neck stiffness, confusion, loss of memory, sensitivity to light, hallucination, loss of contact with reality, or seizures joint pain mouth sores redness, blistering, peeling or loosening of the skin, including inside the mouth severe muscle pain or weakness signs and symptoms of high blood sugar such as dizziness; dry mouth; dry skin; fruity breath; nausea; stomach pain; increased hunger or thirst; increased urination signs and symptoms of  kidney injury like trouble passing urine or change in the amount of urine signs and symptoms of liver injury like dark yellow or brown  urine; general ill feeling or flu-like symptoms; light-colored stools; loss of appetite; nausea; right upper belly pain; unusually weak or tired; yellowing of the eyes or skin swelling of the ankles, feet, hands trouble passing urine or change in the amount of urine unusually weak or tired weight gain or loss Side effects that usually do not require medical attention (report to yourdoctor or health care professional if they continue or are bothersome): bone pain constipation decreased appetite diarrhea muscle pain nausea, vomiting tiredness This list may not describe all possible side effects. Call your doctor for medical advice about side effects. You may report side effects to FDA at1-800-FDA-1088. Where should I keep my medication? This drug is given in a hospital or clinic and will not be stored at home. NOTE: This sheet is a summary. It may not cover all possible information. If you have questions about this medicine, talk to your doctor, pharmacist, orhealth care provider.  2022 Elsevier/Gold Standard (2019-08-03 10:08:25)

## 2020-10-18 NOTE — Progress Notes (Signed)
Provider aware of elevated ast and alt, okay to proceed with tx as scheduled

## 2020-10-24 ENCOUNTER — Telehealth: Payer: Self-pay | Admitting: Physician Assistant

## 2020-10-24 ENCOUNTER — Ambulatory Visit (HOSPITAL_COMMUNITY)
Admission: RE | Admit: 2020-10-24 | Discharge: 2020-10-24 | Disposition: A | Discharge status: Home / Self Care | Payer: Medicare HMO | Source: Ambulatory Visit | Attending: Physician Assistant | Admitting: Physician Assistant

## 2020-10-24 ENCOUNTER — Other Ambulatory Visit: Payer: Self-pay

## 2020-10-24 DIAGNOSIS — R7989 Other specified abnormal findings of blood chemistry: Secondary | ICD-10-CM | POA: Diagnosis not present

## 2020-10-24 DIAGNOSIS — R918 Other nonspecific abnormal finding of lung field: Secondary | ICD-10-CM | POA: Diagnosis present

## 2020-10-24 NOTE — Telephone Encounter (Signed)
I called to let the patient know that her ultrasound of the liver did not show any concerns to explain the elevated LFTs. Likely secondary to excessive tylenol use due to her headaches. She expressed understanding. We also reviewed the appointments for her upcoming brain MRI and CT scan.

## 2020-10-26 ENCOUNTER — Other Ambulatory Visit: Payer: Self-pay

## 2020-10-26 ENCOUNTER — Ambulatory Visit (HOSPITAL_COMMUNITY)
Admission: RE | Admit: 2020-10-26 | Discharge: 2020-10-26 | Disposition: A | Discharge status: Home / Self Care | Payer: Medicare HMO | Source: Ambulatory Visit | Attending: Physician Assistant | Admitting: Physician Assistant

## 2020-10-26 DIAGNOSIS — C3490 Malignant neoplasm of unspecified part of unspecified bronchus or lung: Secondary | ICD-10-CM | POA: Insufficient documentation

## 2020-10-26 DIAGNOSIS — R519 Headache, unspecified: Secondary | ICD-10-CM | POA: Insufficient documentation

## 2020-10-26 MED ORDER — GADOBUTROL 1 MMOL/ML IV SOLN
6.0000 mL | Freq: Once | INTRAVENOUS | Status: AC | PRN
  Administered 2020-10-26: 6 mL via INTRAVENOUS

## 2020-10-29 ENCOUNTER — Telehealth: Payer: Self-pay | Admitting: Physician Assistant

## 2020-10-29 NOTE — Telephone Encounter (Signed)
I called the patient and let her know her brain MRI was negative for metastatic disease. I recommended that she follow up with her PCP for further evaluation and management of her headaches.

## 2020-11-12 ENCOUNTER — Ambulatory Visit (HOSPITAL_COMMUNITY)
Admission: RE | Admit: 2020-11-12 | Discharge: 2020-11-12 | Disposition: A | Discharge status: Home / Self Care | Payer: Medicare HMO | Source: Ambulatory Visit | Attending: Physician Assistant | Admitting: Physician Assistant

## 2020-11-12 ENCOUNTER — Other Ambulatory Visit: Payer: Self-pay

## 2020-11-12 DIAGNOSIS — C3490 Malignant neoplasm of unspecified part of unspecified bronchus or lung: Secondary | ICD-10-CM | POA: Diagnosis present

## 2020-11-12 MED ORDER — IOHEXOL 350 MG/ML SOLN
100.0000 mL | Freq: Once | INTRAVENOUS | Status: AC | PRN
  Administered 2020-11-12: 80 mL via INTRAVENOUS

## 2020-11-15 ENCOUNTER — Inpatient Hospital Stay: Payer: Medicare HMO

## 2020-11-15 ENCOUNTER — Other Ambulatory Visit: Payer: Self-pay

## 2020-11-15 ENCOUNTER — Inpatient Hospital Stay: Payer: Medicare HMO | Attending: Internal Medicine | Admitting: Internal Medicine

## 2020-11-15 VITALS — BP 134/79 | HR 107 | Temp 97.4°F | Resp 20 | Ht 66.0 in | Wt 138.3 lb

## 2020-11-15 DIAGNOSIS — J449 Chronic obstructive pulmonary disease, unspecified: Secondary | ICD-10-CM | POA: Diagnosis not present

## 2020-11-15 DIAGNOSIS — C342 Malignant neoplasm of middle lobe, bronchus or lung: Secondary | ICD-10-CM | POA: Diagnosis present

## 2020-11-15 DIAGNOSIS — Z9981 Dependence on supplemental oxygen: Secondary | ICD-10-CM | POA: Diagnosis not present

## 2020-11-15 DIAGNOSIS — C3491 Malignant neoplasm of unspecified part of right bronchus or lung: Secondary | ICD-10-CM | POA: Diagnosis not present

## 2020-11-15 DIAGNOSIS — Z79899 Other long term (current) drug therapy: Secondary | ICD-10-CM | POA: Diagnosis not present

## 2020-11-15 DIAGNOSIS — Z5112 Encounter for antineoplastic immunotherapy: Secondary | ICD-10-CM | POA: Diagnosis present

## 2020-11-15 LAB — CBC WITH DIFFERENTIAL (CANCER CENTER ONLY)
Abs Immature Granulocytes: 0.01 10*3/uL (ref 0.00–0.07)
Basophils Absolute: 0 10*3/uL (ref 0.0–0.1)
Basophils Relative: 1 %
Eosinophils Absolute: 0.2 10*3/uL (ref 0.0–0.5)
Eosinophils Relative: 4 %
HCT: 39.5 % (ref 36.0–46.0)
Hemoglobin: 13.8 g/dL (ref 12.0–15.0)
Immature Granulocytes: 0 %
Lymphocytes Relative: 22 %
Lymphs Abs: 1.2 10*3/uL (ref 0.7–4.0)
MCH: 29.1 pg (ref 26.0–34.0)
MCHC: 34.9 g/dL (ref 30.0–36.0)
MCV: 83.2 fL (ref 80.0–100.0)
Monocytes Absolute: 0.5 10*3/uL (ref 0.1–1.0)
Monocytes Relative: 9 %
Neutro Abs: 3.6 10*3/uL (ref 1.7–7.7)
Neutrophils Relative %: 64 %
Platelet Count: 236 10*3/uL (ref 150–400)
RBC: 4.75 MIL/uL (ref 3.87–5.11)
RDW: 15.7 % — ABNORMAL HIGH (ref 11.5–15.5)
WBC Count: 5.6 10*3/uL (ref 4.0–10.5)
nRBC: 0 % (ref 0.0–0.2)

## 2020-11-15 LAB — CMP (CANCER CENTER ONLY)
ALT: 160 U/L — ABNORMAL HIGH (ref 0–44)
AST: 166 U/L — ABNORMAL HIGH (ref 15–41)
Albumin: 3.7 g/dL (ref 3.5–5.0)
Alkaline Phosphatase: 628 U/L — ABNORMAL HIGH (ref 38–126)
Anion gap: 12 (ref 5–15)
BUN: 13 mg/dL (ref 8–23)
CO2: 27 mmol/L (ref 22–32)
Calcium: 10 mg/dL (ref 8.9–10.3)
Chloride: 106 mmol/L (ref 98–111)
Creatinine: 0.75 mg/dL (ref 0.44–1.00)
GFR, Estimated: >60 mL/min (ref >60)
Glucose, Bld: 97 mg/dL (ref 70–99)
Potassium: 3.9 mmol/L (ref 3.5–5.1)
Sodium: 145 mmol/L (ref 135–145)
Total Bilirubin: <0.2 mg/dL — ABNORMAL LOW (ref 0.3–1.2)
Total Protein: 7.3 g/dL (ref 6.5–8.1)

## 2020-11-15 LAB — TSH: TSH: 5.544 u[IU]/mL — ABNORMAL HIGH (ref 0.308–3.960)

## 2020-11-15 NOTE — Progress Notes (Signed)
[facility] Telephone:([phone]) [phone]   Fax:(336) [phone]  OFFICE PROGRESS NOTE  [name], [place] [address]  DIAGNOSIS: Recurrent non-small cell lung cancer initially diagnosed as stage IIIA (T1b, N2, M0) non-small cell lung cancer presented with right middle lobe pulmonary nodule, mediastinal lymphadenopathy and highly suspicious for small nodule in the left upper lobe that could change her stage to stage IV that could present another synchronous primary lesion in the left upper lobe. This was diagnosed in September 2017.  PRIOR THERAPY:  1) Concurrent chemoradiation with weekly carboplatin for AUC of 2 and paclitaxel 45 MG/M2 status post 6 cycles last dose was given 02/25/2016 with partial response. 2) Consolidation chemotherapy with carboplatin for AUC of 5 and paclitaxel 175 MG/M2 every 3 weeks with Neulasta support. First dose 04/22/2016. Status post 3 cycles. 3) Second line treatment with immunotherapy with Nivolumab 480 mg IV every 4 weeks status post 32 cycles.  Discontinued secondary to intolerance and frequent hospitalization with pneumonia and pneumonitis.  She had a break of treatment between November 24, 2019 until June 28, 2020.  CURRENT THERAPY: Resuming her treatment with immunotherapy with nivolumab 480 mg IV every 4 weeks.  Cycle #33 started on June 28, 2020.  Status post 37 cycles.  INTERVAL HISTORY: Courtney Zuniga 70 y.o. female returns to the clinic today for follow-up visit.  The patient is feeling fine today with no concerning complaints except for the baseline shortness of breath and sleep disturbance.  She denied having any current chest pain, cough or hemoptysis.  She denied having any nausea, vomiting, diarrhea or constipation.  She has no weight loss or night sweats.  She continues to have intermittent headache with no visual changes.  She has been tolerating her treatment with nivolumab fairly well.  The patient had  repeat CT scan of the chest, abdomen pelvis performed recently and she is here for evaluation and discussion of her scan results and treatment options.  MEDICAL HISTORY: Past Medical History:  Diagnosis Date   Anemia    as a young woman   Arthritis    osteoartritis   Asthma    Brain tumor (benign) ([facility]) 2005 [facility]   Benign   Chronic headaches    Chronic hip pain    Chronic pain    COPD (chronic obstructive pulmonary disease) (H[facility])    Coronary artery disease    Depression    Depression 05/15/2016   Encounter for antineoplastic chemotherapy 01/10/2016   GERD (gastroesophageal reflux disease)    Hypertension    Lung cancer ([facility] [place]) dx'd 01/2016   currently on chemo and radiation    NSTEMI (non-ST elevated myocardial infarction) ([facility]) yrs ago   On home O2    qhs 2 liters at hs and prn   Pneumonia last time 2 yrs ago   Shortness of breath dyspnea    with activity    ALLERGIES:  has No Known Allergies.  MEDICATIONS:  Current Outpatient Medications  Medication Sig Dispense Refill   acetaminophen (TYLENOL) 325 MG tablet Take 2 tablets (650 mg total) by mouth every 6 (six) hours as needed for mild pain, fever or headache. 12 tablet 2   albuterol (PROVENTIL HFA;VENTOLIN HFA) 108 (90 BASE) MCG/ACT inhaler Inhale 2 puffs into the lungs every 4 (four) hours as needed for shortness of breath.      atorvastatin (LIPITOR) 10 MG tablet Take 10 mg by mouth daily.     BREO ELLIPTA 100-25  MCG/INH AEPB Inhale 1 puff into the lungs daily.      Cholecalciferol (VITAMIN D3) 125 MCG (5000 UT) CAPS Take 1 capsule by mouth daily.  0   cyclobenzaprine (FLEXERIL) 10 MG tablet Take 1 tablet (10 mg total) by mouth 2 (two) times daily. *MAy take one additional tablet as needed for muscle spasms (Patient taking differently: Take 10 mg by mouth 2 (two) times daily. *May take one additional tablet as needed for muscle spasms) 60 tablet 2   dextromethorphan-guaiFENesin (MUCINEX DM) 30-600 MG 12hr tablet Take 1  tablet by mouth 2 (two) times daily. 40 tablet 0   diclofenac sodium (VOLTAREN) 1 % GEL Apply topically 4 (four) times daily as needed (massge gel into affected area(s) as needed for pain).   1   DULoxetine (CYMBALTA) 30 MG capsule Take 30 mg by mouth daily.     Dupilumab (DUPIXENT) 300 MG/2ML SOPN Inject 300 mg into the skin every 14 (fourteen) days. Twice a month     ENSURE (ENSURE) Take 237 mLs by mouth 3 (three) times daily between meals.     HYDROcodone bit-homatropine (HYCODAN) 5-1.5 MG/5ML syrup Take 5 mLs by mouth every 6 (six) hours as needed for cough. 120 mL 0   Ipratropium-Albuterol (COMBIVENT) 20-100 MCG/ACT AERS respimat Inhale 2 puffs into the lungs 4 (four) times daily as needed.     ipratropium-albuterol (DUONEB) 0.5-2.5 (3) MG/3ML SOLN Take 3 mLs by nebulization 3 (three) times daily. 360 mL 2   metoprolol tartrate (LOPRESSOR) 25 MG tablet Take 1 tablet (25 mg total) by mouth 2 (two) times daily. 60 tablet 0   oxyCODONE-acetaminophen (PERCOCET/ROXICET) 5-325 MG tablet Take 1 tablet by mouth every 8 (eight) hours as needed for moderate pain. 12 tablet 0   topiramate (TOPAMAX) 25 MG tablet Take 1 tablet by mouth daily at bedtime X 1 week; then increase to 25mg  BID. (Patient taking differently: Take 25 mg by mouth 2 (two) times daily.) 45 tablet 2   No current facility-administered medications for this visit.    SURGICAL HISTORY:  Past Surgical History:  Procedure Laterality Date   CHOLECYSTECTOMY     COLONOSCOPY  2015   Results requested from [facility]   COLONOSCOPY     ESOPHAGOGASTRODUODENOSCOPY N/A 08/14/2015   Procedure: ESOPHAGOGASTRODUODENOSCOPY (EGD);  Surgeon: Daneil Dolin, MD;  Location: [facility];  Service: Endoscopy;  Laterality: N/A;  215    ESOPHAGOGASTRODUODENOSCOPY (EGD) WITH PROPOFOL N/A 09/13/2015   Procedure: ESOPHAGOGASTRODUODENOSCOPY (EGD) WITH PROPOFOL;  Surgeon: Milus Banister, MD;  Location: [facility];  Service: Endoscopy;  Laterality:  N/A;   EUS N/A 03/12/2017   Procedure: UPPER ENDOSCOPIC ULTRASOUND (EUS) RADIAL;  Surgeon: Milus Banister, MD;  Location: [facility];  Service: Endoscopy;  Laterality: N/A;   TUMOR REMOVAL  2005   Benign   UPPER ESOPHAGEAL ENDOSCOPIC ULTRASOUND (EUS)  09/13/2015   Procedure: UPPER ESOPHAGEAL ENDOSCOPIC ULTRASOUND (EUS);  Surgeon: Milus Banister, MD;  Location: [facility];  Service: Endoscopy;;   VIDEO BRONCHOSCOPY WITH ENDOBRONCHIAL NAVIGATION N/A 12/31/2015   Procedure: VIDEO BRONCHOSCOPY WITH ENDOBRONCHIAL NAVIGATION;  Surgeon: Melrose Nakayama, MD;  Location: [facility];  Service: Thoracic;  Laterality: N/A;   VIDEO BRONCHOSCOPY WITH ENDOBRONCHIAL ULTRASOUND N/A 11/08/2015   Procedure: VIDEO BRONCHOSCOPY WITH ENDOBRONCHIAL ULTRASOUND;  Surgeon: Ivin Poot, MD;  Location: [facility];  Service: Thoracic;  Laterality: N/A;    REVIEW OF SYSTEMS:  Constitutional: positive for fatigue Eyes: negative Ears, nose, mouth, throat, and face: negative  Respiratory: positive for dyspnea on exertion Cardiovascular: negative Gastrointestinal: negative Genitourinary:negative Integument/breast: negative Hematologic/lymphatic: negative Musculoskeletal:negative Neurological: positive for headaches Behavioral/Psych: positive for sleep disturbance Endocrine: negative Allergic/Immunologic: negative   PHYSICAL EXAMINATION: General appearance: alert, cooperative, fatigued, and no distress Head: Normocephalic, without obvious abnormality, atraumatic Neck: no adenopathy, no JVD, supple, symmetrical, trachea midline, and thyroid not enlarged, symmetric, no tenderness/mass/nodules Lymph nodes: Cervical, supraclavicular, and axillary nodes normal. Resp: clear to auscultation bilaterally Back: symmetric, no curvature. ROM normal. No CVA tenderness. Cardio: regular rate and rhythm, S1, S2 normal, no murmur, click, rub or gallop GI: soft, non-tender; bowel sounds normal; no masses,  no  organomegaly Extremities: extremities normal, atraumatic, no cyanosis or edema Neurologic: Alert and oriented X 3, normal strength and tone. Normal symmetric reflexes. Normal coordination and gait  ECOG PERFORMANCE STATUS: 1 - Symptomatic but completely ambulatory  Blood pressure 134/79, pulse (!) 107, temperature (!) 97.4 F (36.3 C), temperature source Tympanic, resp. rate 20, height 5\' 6"  (1.676 m), weight 138 lb 4.8 oz (62.7 kg), SpO2 95 %.  LABORATORY DATA: Lab Results  Component Value Date   WBC 4.0 10/18/2020   HGB 14.2 10/18/2020   HCT 41.3 10/18/2020   MCV 83.8 10/18/2020   PLT 281 10/18/2020      Chemistry      Component Value Date/Time   NA 145 11/15/2020 0837   NA 144 04/09/2017 0948   K 3.9 11/15/2020 0837   K 4.3 04/09/2017 0948   CL 106 11/15/2020 0837   CO2 27 11/15/2020 0837   CO2 25 04/09/2017 0948   BUN 13 11/15/2020 0837   BUN 16.8 04/09/2017 0948   CREATININE 0.75 11/15/2020 0837   CREATININE 0.8 04/09/2017 0948      Component Value Date/Time   CALCIUM 10.0 11/15/2020 0837   CALCIUM 9.1 04/09/2017 0948   ALKPHOS 628 (H) 11/15/2020 0837   ALKPHOS 216 (H) 04/09/2017 0948   AST 166 (H) 11/15/2020 0837   AST 12 04/09/2017 0948   ALT 160 (H) 11/15/2020 0837   ALT 13 04/09/2017 0948   BILITOT <0.2 (L) 11/15/2020 0837   BILITOT 0.22 04/09/2017 0948       RADIOGRAPHIC STUDIES:-   CT Chest W Contrast  Result Date: 11/12/2020 CLINICAL DATA:  Restaging non-small cell lung cancer. EXAM: CT CHEST, ABDOMEN, AND PELVIS WITH CONTRAST TECHNIQUE: Multidetector CT imaging of the chest, abdomen and pelvis was performed following the standard protocol during bolus administration of intravenous contrast. CONTRAST:  7mL OMNIPAQUE IOHEXOL 350 MG/ML SOLN COMPARISON:  PET-CT from 06/18/2020 FINDINGS: CT CHEST FINDINGS Cardiovascular: Heart size is normal. No pericardial effusion. Aortic atherosclerosis and coronary artery calcifications. Mediastinum/Nodes: Normal  appearance of the thyroid gland. The trachea appears patent and is midline. Normal appearance of the esophagus. No enlarged lymph nodes. Lungs/Pleura: Advanced changes centrilobular and paraseptal emphysema. No pleural effusion identified. Right perihilar mass-like architectural distortion and fibrosis is again noted, image 90/4. This is stable in appearance when compared with 02/24/2020 compatible with changes secondary to external beam radiation. Right apical mass initially identified on CT of the chest from 06/05/2020 has decreased in size in the interval. On today's study this measures 3.0 x 2.8 x 4.1 cm (volume = 18 cm^3), image 29/4. On 06/05/2020 this measured 5.1 x 3.2 by 4.6 cm (volume = 39 cm^3). On the study from 08/06/2020 this measured 3.1 by 2.7 by 4.4 cm (volume = 19 cm^3). The satellite nodule in the posterior right upper lobe measures 6 mm, image 25/4. On  08/06/2020 1.1 cm. No new sites of disease identified within the lungs. Musculoskeletal: No chest wall mass or suspicious bone lesions identified. CT ABDOMEN PELVIS FINDINGS Hepatobiliary: No suspicious liver abnormality. Previous cholecystectomy. Unchanged chronic increase caliber of the CBD. Pancreas: Unremarkable. No pancreatic ductal dilatation or surrounding inflammatory changes. Spleen: Normal in size without focal abnormality. Adrenals/Urinary Tract: Normal adrenal glands. Several small kidney cysts. No suspicious mass or hydronephrosis identified. The urinary bladder is unremarkable. Stomach/Bowel: Stomach is within normal limits. Appendix appears normal. No evidence of bowel wall thickening, distention, or inflammatory changes. Distal colonic diverticulosis noted without signs of acute inflammation. Vascular/Lymphatic: Aortic atherosclerosis. No aneurysm. No abdominopelvic adenopathy. Reproductive: Calcified uterine fibroids.  No adnexal mass. Other: No free fluid or fluid collections. Tiny periumbilical hernia contains fat only.  Musculoskeletal: No acute or suspicious osseous findings. IMPRESSION: 1. Interval response to therapy. There is been approximate 50% reduction in total volume of right apical lung mass compared with 06/05/2020. Little change in the appearance of this mass when compared with the more recent exam from 08/06/2020. 2. No new or progressive disease identified. 3. Emphysema and aortic atherosclerosis. Aortic Atherosclerosis (ICD10-I70.0) and Emphysema (ICD10-J43.9). Electronically Signed   By: Kerby Moors M.D.   On: 11/12/2020 16:48   MR Brain W Wo Contrast  Result Date: 10/27/2020 CLINICAL DATA:  Non-small-cell lung cancer, staging EXAM: MRI HEAD WITHOUT AND WITH CONTRAST TECHNIQUE: Multiplanar, multiecho pulse sequences of the brain and surrounding structures were obtained without and with intravenous contrast. CONTRAST:  60mL GADAVIST GADOBUTROL 1 MMOL/ML IV SOLN COMPARISON:  MRI head 01/31/2019 FINDINGS: Brain: Negative for metastatic disease to brain. No enhancing lesions identified. Generalized atrophy. Moderate white matter changes consistent with chronic microvascular ischemia. Mild chronic ischemia in the pons. No acute infarct or intracranial hemorrhage. Vascular: Normal arterial flow voids. Skull and upper cervical spine: No focal skeletal lesions identified. Sinuses/Orbits: Bilateral mastoid effusion. Paranasal sinuses clear. Negative orbit Other: None IMPRESSION: Negative for metastatic disease Atrophy and chronic microvascular ischemia. Electronically Signed   By: Franchot Gallo M.D.   On: 10/27/2020 18:21   US Abdomen Complete  Result Date: 10/24/2020 CLINICAL DATA:  Elevated LFTs EXAM: ABDOMEN ULTRASOUND COMPLETE COMPARISON:  CT 06/05/2020 FINDINGS: Gallbladder: Prior cholecystectomy Common bile duct: Diameter: Dilated, 10 mm, stable since CT. Liver: No focal lesion identified. Within normal limits in parenchymal echogenicity. Portal vein is patent on color Doppler imaging with normal direction  of blood flow towards the liver. IVC: No abnormality visualized. Pancreas: Visualized portion unremarkable. Spleen: Size and appearance within normal limits. Right Kidney: Length: 12.2 cm. Echogenicity within normal limits. No mass or hydronephrosis visualized. Left Kidney: Length: 10.6 cm. Normal echotexture. No hydronephrosis. 13 mm cyst in the midpole. Abdominal aorta: No aneurysm visualized. Other findings: None. IMPRESSION: Prior cholecystectomy. Dilated common bile duct is stable since prior study and likely related to age and post cholecystectomy state. No acute findings. Electronically Signed   By: Rolm Baptise M.D.   On: 10/24/2020 10:08   CT Abdomen Pelvis W Contrast  Result Date: 11/12/2020 CLINICAL DATA:  Restaging non-small cell lung cancer. EXAM: CT CHEST, ABDOMEN, AND PELVIS WITH CONTRAST TECHNIQUE: Multidetector CT imaging of the chest, abdomen and pelvis was performed following the standard protocol during bolus administration of intravenous contrast. CONTRAST:  22mL OMNIPAQUE IOHEXOL 350 MG/ML SOLN COMPARISON:  PET-CT from 06/18/2020 FINDINGS: CT CHEST FINDINGS Cardiovascular: Heart size is normal. No pericardial effusion. Aortic atherosclerosis and coronary artery calcifications. Mediastinum/Nodes: Normal appearance of the thyroid gland. The trachea  appears patent and is midline. Normal appearance of the esophagus. No enlarged lymph nodes. Lungs/Pleura: Advanced changes centrilobular and paraseptal emphysema. No pleural effusion identified. Right perihilar mass-like architectural distortion and fibrosis is again noted, image 90/4. This is stable in appearance when compared with 02/24/2020 compatible with changes secondary to external beam radiation. Right apical mass initially identified on CT of the chest from 06/05/2020 has decreased in size in the interval. On today's study this measures 3.0 x 2.8 x 4.1 cm (volume = 18 cm^3), image 29/4. On 06/05/2020 this measured 5.1 x 3.2 by 4.6 cm  (volume = 39 cm^3). On the study from 08/06/2020 this measured 3.1 by 2.7 by 4.4 cm (volume = 19 cm^3). The satellite nodule in the posterior right upper lobe measures 6 mm, image 25/4. On 08/06/2020 1.1 cm. No new sites of disease identified within the lungs. Musculoskeletal: No chest wall mass or suspicious bone lesions identified. CT ABDOMEN PELVIS FINDINGS Hepatobiliary: No suspicious liver abnormality. Previous cholecystectomy. Unchanged chronic increase caliber of the CBD. Pancreas: Unremarkable. No pancreatic ductal dilatation or surrounding inflammatory changes. Spleen: Normal in size without focal abnormality. Adrenals/Urinary Tract: Normal adrenal glands. Several small kidney cysts. No suspicious mass or hydronephrosis identified. The urinary bladder is unremarkable. Stomach/Bowel: Stomach is within normal limits. Appendix appears normal. No evidence of bowel wall thickening, distention, or inflammatory changes. Distal colonic diverticulosis noted without signs of acute inflammation. Vascular/Lymphatic: Aortic atherosclerosis. No aneurysm. No abdominopelvic adenopathy. Reproductive: Calcified uterine fibroids.  No adnexal mass. Other: No free fluid or fluid collections. Tiny periumbilical hernia contains fat only. Musculoskeletal: No acute or suspicious osseous findings. IMPRESSION: 1. Interval response to therapy. There is been approximate 50% reduction in total volume of right apical lung mass compared with 06/05/2020. Little change in the appearance of this mass when compared with the more recent exam from 08/06/2020. 2. No new or progressive disease identified. 3. Emphysema and aortic atherosclerosis. Aortic Atherosclerosis (ICD10-I70.0) and Emphysema (ICD10-J43.9). Electronically Signed   By: Kerby Moors M.D.   On: 11/12/2020 16:48     ASSESSMENT AND PLAN:  This is a very pleasant 70 years old African-American female with a recurrent non-small cell lung cancer initially diagnosed as stage IIIA  non-small cell lung cancer status post concurrent chemoradiation followed by consolidation chemotherapy with carboplatin and paclitaxel for 3 cycles. The patient was followed by observation but restaging imaging studies showed evidence for disease progression. She was started on second line treatment with immunotherapy with Nivolumab 480 mg IV every 4 weeks status post 32 cycles.   The patient has been in observation since August 2021 with no concerning complaints except for the baseline shortness of breath and intermittent headache. Recent imaging studies including CT scan of the chest scan showed development of right apical lung mass consistent with recurrent/metastatic disease.  I ordered a PET scan which was performed recently.  I personally and independently reviewed the scan images and discussed the results with the patient today. Her scan showed persistent right upper lobe hypermetabolic activity still suspicious for bronchogenic carcinoma but inflammatory process could be contributing to the hypermetabolic activity. The patient resumed her treatment with cycle number cycle #33 of single agent nivolumab 480 mg IV every 4 weeks in March 2022 status post 37 cycles. The patient continues to tolerate this treatment well with no concerning adverse effects. She had repeat CT scan of the chest, abdomen pelvis performed recently.  I personally and independently reviewed the scan and discussed the results with the patient  today.  Her scan showed interval improvement of her disease with 50% reduction in the total volume of the right apical lung mass. I recommended for the patient to continue her treatment with immunotherapy but we will delay the start of cycle number 38 by 1 week until improvement of her liver enzymes which could be related to the immunotherapy. I will start the patient empirically on Medrol Dosepak for 1 week but if no improvement, I will consider her for a tapered dose of high dose  prednisone. The patient will come back for follow-up visit in 1 week for evaluation before starting the next cycle of her treatment. For the COPD, she will continue with home oxygen. The patient was advised to call immediately if she has any other concerning symptoms in the interval. The patient voices understanding of current disease status and treatment options and is in agreement with the current care plan. All questions were answered. The patient knows to call the clinic with any problems, questions or concerns. We can certainly see the patient much sooner if necessary.  Disclaimer: This note was dictated with voice recognition software. Similar sounding words can inadvertently be transcribed and may not be corrected upon review.

## 2020-11-16 ENCOUNTER — Telehealth: Payer: Self-pay | Admitting: Medical Oncology

## 2020-11-16 NOTE — Telephone Encounter (Signed)
Pt LVM to call her.I returned call and could not leave a message -Mailbox full or not available.

## 2020-11-19 ENCOUNTER — Telehealth: Payer: Self-pay | Admitting: Medical Oncology

## 2020-11-19 ENCOUNTER — Other Ambulatory Visit: Payer: Self-pay | Admitting: Internal Medicine

## 2020-11-19 MED ORDER — METHYLPREDNISOLONE 4 MG PO TBPK: ORAL_TABLET | ORAL | 0 refills | Status: DC

## 2020-11-19 NOTE — Telephone Encounter (Signed)
She is waiting on a prescription from Dr Julien Nordmann. The pharmacy has not received it.

## 2020-11-20 NOTE — Progress Notes (Signed)
Confirmed with pt that  her prescription had been sent in and she needed to start it today. Also confirmed with pt that she has an appointment on 11/22/20. Pt acknowledged and verbalized understanding.

## 2020-11-22 ENCOUNTER — Inpatient Hospital Stay: Payer: Medicare HMO

## 2020-11-22 ENCOUNTER — Inpatient Hospital Stay: Payer: Medicare HMO | Admitting: Internal Medicine

## 2020-12-04 ENCOUNTER — Other Ambulatory Visit: Payer: Self-pay | Admitting: Medical Oncology

## 2020-12-04 DIAGNOSIS — C3490 Malignant neoplasm of unspecified part of unspecified bronchus or lung: Secondary | ICD-10-CM

## 2020-12-05 ENCOUNTER — Inpatient Hospital Stay: Payer: Medicare HMO

## 2020-12-05 ENCOUNTER — Other Ambulatory Visit: Payer: Self-pay

## 2020-12-05 ENCOUNTER — Inpatient Hospital Stay (HOSPITAL_BASED_OUTPATIENT_CLINIC_OR_DEPARTMENT_OTHER): Payer: Medicare HMO | Admitting: Internal Medicine

## 2020-12-05 VITALS — BP 144/85 | HR 91 | Temp 97.3°F | Resp 20 | Ht 66.0 in | Wt 138.9 lb

## 2020-12-05 DIAGNOSIS — Z5112 Encounter for antineoplastic immunotherapy: Secondary | ICD-10-CM

## 2020-12-05 DIAGNOSIS — C3491 Malignant neoplasm of unspecified part of right bronchus or lung: Secondary | ICD-10-CM

## 2020-12-05 DIAGNOSIS — C3490 Malignant neoplasm of unspecified part of unspecified bronchus or lung: Secondary | ICD-10-CM

## 2020-12-05 LAB — CMP (CANCER CENTER ONLY)
ALT: 16 U/L (ref 0–44)
AST: 30 U/L (ref 15–41)
Albumin: 3.6 g/dL (ref 3.5–5.0)
Alkaline Phosphatase: 366 U/L — ABNORMAL HIGH (ref 38–126)
Anion gap: 10 (ref 5–15)
BUN: 12 mg/dL (ref 8–23)
CO2: 31 mmol/L (ref 22–32)
Calcium: 9.3 mg/dL (ref 8.9–10.3)
Chloride: 103 mmol/L (ref 98–111)
Creatinine: 0.71 mg/dL (ref 0.44–1.00)
GFR, Estimated: >60 mL/min (ref >60)
Glucose, Bld: 80 mg/dL (ref 70–99)
Potassium: 4.4 mmol/L (ref 3.5–5.1)
Sodium: 144 mmol/L (ref 135–145)
Total Bilirubin: 0.2 mg/dL — ABNORMAL LOW (ref 0.3–1.2)
Total Protein: 6.9 g/dL (ref 6.5–8.1)

## 2020-12-05 LAB — CBC WITH DIFFERENTIAL (CANCER CENTER ONLY)
Abs Immature Granulocytes: 0.01 10*3/uL (ref 0.00–0.07)
Basophils Absolute: 0 10*3/uL (ref 0.0–0.1)
Basophils Relative: 1 %
Eosinophils Absolute: 0.1 10*3/uL (ref 0.0–0.5)
Eosinophils Relative: 2 %
HCT: 41 % (ref 36.0–46.0)
Hemoglobin: 14.1 g/dL (ref 12.0–15.0)
Immature Granulocytes: 0 %
Lymphocytes Relative: 27 %
Lymphs Abs: 1.3 10*3/uL (ref 0.7–4.0)
MCH: 28.8 pg (ref 26.0–34.0)
MCHC: 34.4 g/dL (ref 30.0–36.0)
MCV: 83.7 fL (ref 80.0–100.0)
Monocytes Absolute: 0.4 10*3/uL (ref 0.1–1.0)
Monocytes Relative: 9 %
Neutro Abs: 2.8 10*3/uL (ref 1.7–7.7)
Neutrophils Relative %: 61 %
Platelet Count: 204 10*3/uL (ref 150–400)
RBC: 4.9 MIL/uL (ref 3.87–5.11)
RDW: 15 % (ref 11.5–15.5)
WBC Count: 4.6 10*3/uL (ref 4.0–10.5)
nRBC: 0 % (ref 0.0–0.2)

## 2020-12-05 MED ORDER — SODIUM CHLORIDE 0.9 % IV SOLN
480.0000 mg | Freq: Once | INTRAVENOUS | Status: AC
  Administered 2020-12-05: 480 mg via INTRAVENOUS
  Filled 2020-12-05: qty 48

## 2020-12-05 MED ORDER — SODIUM CHLORIDE 0.9 % IV SOLN: Freq: Once | INTRAVENOUS | Status: AC

## 2020-12-05 NOTE — Progress Notes (Signed)
[facility] Telephone:([phone]   Fax:(336) [phone]  OFFICE PROGRESS NOTE  [name], [place] [address]  DIAGNOSIS: Recurrent non-small cell lung cancer initially diagnosed as stage IIIA (T1b, N2, M0) non-small cell lung cancer presented with right middle lobe pulmonary nodule, mediastinal lymphadenopathy and highly suspicious for small nodule in the left upper lobe that could change her stage to stage IV that could present another synchronous primary lesion in the left upper lobe. This was diagnosed in September 2017.  PRIOR THERAPY:  1) Concurrent chemoradiation with weekly carboplatin for AUC of 2 and paclitaxel 45 MG/M2 status post 6 cycles last dose was given 02/25/2016 with partial response. 2) Consolidation chemotherapy with carboplatin for AUC of 5 and paclitaxel 175 MG/M2 every 3 weeks with Neulasta support. First dose 04/22/2016. Status post 3 cycles. 3) Second line treatment with immunotherapy with Nivolumab 480 mg IV every 4 weeks status post 32 cycles.  Discontinued secondary to intolerance and frequent hospitalization with pneumonia and pneumonitis.  She had a break of treatment between November 24, 2019 until June 28, 2020.  CURRENT THERAPY: Resuming her treatment with immunotherapy with nivolumab 480 mg IV every 4 weeks.  Cycle #33 started on June 28, 2020.  Status post 37 cycles.  INTERVAL HISTORY: Courtney Zuniga 70 y.o. female returns to the clinic today for follow-up visit.  The patient is feeling fine today with no concerning complaints except for the baseline shortness of breath.  Her symptoms of the COPD has improved after treatment with a Medrol Dosepak.  She denied having any chest pain but has mild cough with no hemoptysis.  She denied having any fever or chills.  She has no nausea, vomiting, diarrhea or constipation.  She has no headache or visual changes.  She is here today for evaluation before resuming her treatment  with immunotherapy.  MEDICAL HISTORY: Past Medical History:  Diagnosis Date   Anemia    as a young woman   Arthritis    osteoartritis   Asthma    Brain tumor (benign) ([facility]) 2005 [facility]   Benign   Chronic headaches    Chronic hip pain    Chronic pain    COPD (chronic obstructive pulmonary disease) (HCC)    Coronary artery disease    Depression    Depression 05/15/2016   Encounter for antineoplastic chemotherapy 01/10/2016   GERD (gastroesophageal reflux disease)    Hypertension    Lung cancer ([facility]) dx'd 01/2016   currently on chemo and radiation    NSTEMI (non-ST elevated myocardial infarction) ([facility]) yrs ago   On home O2    qhs 2 liters at hs and prn   Pneumonia last time 2 yrs ago   Shortness of breath dyspnea    with activity    ALLERGIES:  has No Known Allergies.  MEDICATIONS:  Current Outpatient Medications  Medication Sig Dispense Refill   acetaminophen (TYLENOL) 325 MG tablet Take 2 tablets (650 mg total) by mouth every 6 (six) hours as needed for mild pain, fever or headache. 12 tablet 2   albuterol (PROVENTIL HFA;VENTOLIN HFA) 108 (90 BASE) MCG/ACT inhaler Inhale 2 puffs into the lungs every 4 (four) hours as needed for shortness of breath.      atorvastatin (LIPITOR) 10 MG tablet Take 10 mg by mouth daily.     BREO ELLIPTA 100-25 MCG/INH AEPB Inhale 1 puff into the lungs daily.      Cholecalciferol (VITAMIN D3) 125  MCG (5000 UT) CAPS Take 1 capsule by mouth daily.  0   cyclobenzaprine (FLEXERIL) 10 MG tablet Take 1 tablet (10 mg total) by mouth 2 (two) times daily. *MAy take one additional tablet as needed for muscle spasms (Patient taking differently: Take 10 mg by mouth 2 (two) times daily. *May take one additional tablet as needed for muscle spasms) 60 tablet 2   dextromethorphan-guaiFENesin (MUCINEX DM) 30-600 MG 12hr tablet Take 1 tablet by mouth 2 (two) times daily. 40 tablet 0   diclofenac sodium (VOLTAREN) 1 % GEL Apply topically 4 (four) times daily as  needed (massge gel into affected area(s) as needed for pain).   1   DULoxetine (CYMBALTA) 30 MG capsule Take 30 mg by mouth daily.     Dupilumab (DUPIXENT) 300 MG/2ML SOPN Inject 300 mg into the skin every 14 (fourteen) days. Twice a month     ENSURE (ENSURE) Take 237 mLs by mouth 3 (three) times daily between meals.     HYDROcodone bit-homatropine (HYCODAN) 5-1.5 MG/5ML syrup Take 5 mLs by mouth every 6 (six) hours as needed for cough. 120 mL 0   Ipratropium-Albuterol (COMBIVENT) 20-100 MCG/ACT AERS respimat Inhale 2 puffs into the lungs 4 (four) times daily as needed.     ipratropium-albuterol (DUONEB) 0.5-2.5 (3) MG/3ML SOLN Take 3 mLs by nebulization 3 (three) times daily. 360 mL 2   methylPREDNISolone (MEDROL DOSEPAK) 4 MG TBPK tablet Use as instructed 21 tablet 0   metoprolol tartrate (LOPRESSOR) 25 MG tablet Take 1 tablet (25 mg total) by mouth 2 (two) times daily. 60 tablet 0   oxyCODONE-acetaminophen (PERCOCET/ROXICET) 5-325 MG tablet Take 1 tablet by mouth every 8 (eight) hours as needed for moderate pain. 12 tablet 0   topiramate (TOPAMAX) 25 MG tablet Take 1 tablet by mouth daily at bedtime X 1 week; then increase to 25mg  BID. (Patient taking differently: Take 25 mg by mouth 2 (two) times daily.) 45 tablet 2   No current facility-administered medications for this visit.    SURGICAL HISTORY:  Past Surgical History:  Procedure Laterality Date   CHOLECYSTECTOMY     COLONOSCOPY  2015   Results requested from [facility]   COLONOSCOPY     ESOPHAGOGASTRODUODENOSCOPY N/A 08/14/2015   Procedure: ESOPHAGOGASTRODUODENOSCOPY (EGD);  Surgeon: Daneil Dolin, MD;  [facility];  Service: Endoscopy;  Laterality: N/A;  215    ESOPHAGOGASTRODUODENOSCOPY (EGD) WITH PROPOFOL N/A 09/13/2015   Procedure: ESOPHAGOGASTRODUODENOSCOPY (EGD) WITH PROPOFOL;  Surgeon: Milus Banister, MD;  Location: [facility];  Service: Endoscopy;  Laterality: N/A;   EUS N/A 03/12/2017   Procedure:  UPPER ENDOSCOPIC ULTRASOUND (EUS) RADIAL;  Surgeon: Milus Banister, MD;  Location: [facility];  Service: Endoscopy;  Laterality: N/A;   TUMOR REMOVAL  2005   Benign   UPPER ESOPHAGEAL ENDOSCOPIC ULTRASOUND (EUS)  09/13/2015   Procedure: UPPER ESOPHAGEAL ENDOSCOPIC ULTRASOUND (EUS);  Surgeon: Milus Banister, MD;  Location: [facility];  Service: Endoscopy;;   VIDEO BRONCHOSCOPY WITH ENDOBRONCHIAL NAVIGATION N/A 12/31/2015   Procedure: VIDEO BRONCHOSCOPY WITH ENDOBRONCHIAL NAVIGATION;  Surgeon: Melrose Nakayama, MD;  Location: [facility];  Service: Thoracic;  Laterality: N/A;   VIDEO BRONCHOSCOPY WITH ENDOBRONCHIAL ULTRASOUND N/A 11/08/2015   Procedure: VIDEO BRONCHOSCOPY WITH ENDOBRONCHIAL ULTRASOUND;  Surgeon: Ivin Poot, MD;  Location: [facility] OR;  Service: Thoracic;  Laterality: N/A;    REVIEW OF SYSTEMS:  A comprehensive review of systems was negative except for: Constitutional: positive for fatigue Respiratory: positive for  cough, dyspnea on exertion, and wheezing   PHYSICAL EXAMINATION: General appearance: alert, cooperative, fatigued, and no distress Head: Normocephalic, without obvious abnormality, atraumatic Neck: no adenopathy, no JVD, supple, symmetrical, trachea midline, and thyroid not enlarged, symmetric, no tenderness/mass/nodules Lymph nodes: Cervical, supraclavicular, and axillary nodes normal. Resp: clear to auscultation bilaterally Back: symmetric, no curvature. ROM normal. No CVA tenderness. Cardio: regular rate and rhythm, S1, S2 normal, no murmur, click, rub or gallop GI: soft, non-tender; bowel sounds normal; no masses,  no organomegaly Extremities: extremities normal, atraumatic, no cyanosis or edema  ECOG PERFORMANCE STATUS: 1 - Symptomatic but completely ambulatory  Blood pressure (!) 144/85, pulse 91, temperature (!) 97.3 F (36.3 C), temperature source Tympanic, resp. rate 20, height 5\' 6"  (1.676 m), weight 138 lb 14.4 oz (63 kg), SpO2 91 %.  LABORATORY  DATA: Lab Results  Component Value Date   WBC 4.6 12/05/2020   HGB 14.1 12/05/2020   HCT 41.0 12/05/2020   MCV 83.7 12/05/2020   PLT 204 12/05/2020      Chemistry      Component Value Date/Time   NA 145 11/15/2020 0837   NA 144 04/09/2017 0948   K 3.9 11/15/2020 0837   K 4.3 04/09/2017 0948   CL 106 11/15/2020 0837   CO2 27 11/15/2020 0837   CO2 25 04/09/2017 0948   BUN 13 11/15/2020 0837   BUN 16.8 04/09/2017 0948   CREATININE 0.75 11/15/2020 0837   CREATININE 0.8 04/09/2017 0948      Component Value Date/Time   CALCIUM 10.0 11/15/2020 0837   CALCIUM 9.1 04/09/2017 0948   ALKPHOS 628 (H) 11/15/2020 0837   ALKPHOS 216 (H) 04/09/2017 0948   AST 166 (H) 11/15/2020 0837   AST 12 04/09/2017 0948   ALT 160 (H) 11/15/2020 0837   ALT 13 04/09/2017 0948   BILITOT <0.2 (L) 11/15/2020 0837   BILITOT 0.22 04/09/2017 0948       RADIOGRAPHIC STUDIES:-   CT Chest W Contrast  Result Date: 11/12/2020 CLINICAL DATA:  Restaging non-small cell lung cancer. EXAM: CT CHEST, ABDOMEN, AND PELVIS WITH CONTRAST TECHNIQUE: Multidetector CT imaging of the chest, abdomen and pelvis was performed following the standard protocol during bolus administration of intravenous contrast. CONTRAST:  18mL OMNIPAQUE IOHEXOL 350 MG/ML SOLN COMPARISON:  PET-CT from 06/18/2020 FINDINGS: CT CHEST FINDINGS Cardiovascular: Heart size is normal. No pericardial effusion. Aortic atherosclerosis and coronary artery calcifications. Mediastinum/Nodes: Normal appearance of the thyroid gland. The trachea appears patent and is midline. Normal appearance of the esophagus. No enlarged lymph nodes. Lungs/Pleura: Advanced changes centrilobular and paraseptal emphysema. No pleural effusion identified. Right perihilar mass-like architectural distortion and fibrosis is again noted, image 90/4. This is stable in appearance when compared with 02/24/2020 compatible with changes secondary to external beam radiation. Right apical mass  initially identified on CT of the chest from 06/05/2020 has decreased in size in the interval. On today's study this measures 3.0 x 2.8 x 4.1 cm (volume = 18 cm^3), image 29/4. On 06/05/2020 this measured 5.1 x 3.2 by 4.6 cm (volume = 39 cm^3). On the study from 08/06/2020 this measured 3.1 by 2.7 by 4.4 cm (volume = 19 cm^3). The satellite nodule in the posterior right upper lobe measures 6 mm, image 25/4. On 08/06/2020 1.1 cm. No new sites of disease identified within the lungs. Musculoskeletal: No chest wall mass or suspicious bone lesions identified. CT ABDOMEN PELVIS FINDINGS Hepatobiliary: No suspicious liver abnormality. Previous cholecystectomy. Unchanged chronic increase caliber of the CBD.  Pancreas: Unremarkable. No pancreatic ductal dilatation or surrounding inflammatory changes. Spleen: Normal in size without focal abnormality. Adrenals/Urinary Tract: Normal adrenal glands. Several small kidney cysts. No suspicious mass or hydronephrosis identified. The urinary bladder is unremarkable. Stomach/Bowel: Stomach is within normal limits. Appendix appears normal. No evidence of bowel wall thickening, distention, or inflammatory changes. Distal colonic diverticulosis noted without signs of acute inflammation. Vascular/Lymphatic: Aortic atherosclerosis. No aneurysm. No abdominopelvic adenopathy. Reproductive: Calcified uterine fibroids.  No adnexal mass. Other: No free fluid or fluid collections. Tiny periumbilical hernia contains fat only. Musculoskeletal: No acute or suspicious osseous findings. IMPRESSION: 1. Interval response to therapy. There is been approximate 50% reduction in total volume of right apical lung mass compared with 06/05/2020. Little change in the appearance of this mass when compared with the more recent exam from 08/06/2020. 2. No new or progressive disease identified. 3. Emphysema and aortic atherosclerosis. Aortic Atherosclerosis (ICD10-I70.0) and Emphysema (ICD10-J43.9). Electronically  Signed   By: Kerby Moors M.D.   On: 11/12/2020 16:48   CT Abdomen Pelvis W Contrast  Result Date: 11/12/2020 CLINICAL DATA:  Restaging non-small cell lung cancer. EXAM: CT CHEST, ABDOMEN, AND PELVIS WITH CONTRAST TECHNIQUE: Multidetector CT imaging of the chest, abdomen and pelvis was performed following the standard protocol during bolus administration of intravenous contrast. CONTRAST:  74mL OMNIPAQUE IOHEXOL 350 MG/ML SOLN COMPARISON:  PET-CT from 06/18/2020 FINDINGS: CT CHEST FINDINGS Cardiovascular: Heart size is normal. No pericardial effusion. Aortic atherosclerosis and coronary artery calcifications. Mediastinum/Nodes: Normal appearance of the thyroid gland. The trachea appears patent and is midline. Normal appearance of the esophagus. No enlarged lymph nodes. Lungs/Pleura: Advanced changes centrilobular and paraseptal emphysema. No pleural effusion identified. Right perihilar mass-like architectural distortion and fibrosis is again noted, image 90/4. This is stable in appearance when compared with 02/24/2020 compatible with changes secondary to external beam radiation. Right apical mass initially identified on CT of the chest from 06/05/2020 has decreased in size in the interval. On today's study this measures 3.0 x 2.8 x 4.1 cm (volume = 18 cm^3), image 29/4. On 06/05/2020 this measured 5.1 x 3.2 by 4.6 cm (volume = 39 cm^3). On the study from 08/06/2020 this measured 3.1 by 2.7 by 4.4 cm (volume = 19 cm^3). The satellite nodule in the posterior right upper lobe measures 6 mm, image 25/4. On 08/06/2020 1.1 cm. No new sites of disease identified within the lungs. Musculoskeletal: No chest wall mass or suspicious bone lesions identified. CT ABDOMEN PELVIS FINDINGS Hepatobiliary: No suspicious liver abnormality. Previous cholecystectomy. Unchanged chronic increase caliber of the CBD. Pancreas: Unremarkable. No pancreatic ductal dilatation or surrounding inflammatory changes. Spleen: Normal in size  without focal abnormality. Adrenals/Urinary Tract: Normal adrenal glands. Several small kidney cysts. No suspicious mass or hydronephrosis identified. The urinary bladder is unremarkable. Stomach/Bowel: Stomach is within normal limits. Appendix appears normal. No evidence of bowel wall thickening, distention, or inflammatory changes. Distal colonic diverticulosis noted without signs of acute inflammation. Vascular/Lymphatic: Aortic atherosclerosis. No aneurysm. No abdominopelvic adenopathy. Reproductive: Calcified uterine fibroids.  No adnexal mass. Other: No free fluid or fluid collections. Tiny periumbilical hernia contains fat only. Musculoskeletal: No acute or suspicious osseous findings. IMPRESSION: 1. Interval response to therapy. There is been approximate 50% reduction in total volume of right apical lung mass compared with 06/05/2020. Little change in the appearance of this mass when compared with the more recent exam from 08/06/2020. 2. No new or progressive disease identified. 3. Emphysema and aortic atherosclerosis. Aortic Atherosclerosis (ICD10-I70.0) and Emphysema (ICD10-J43.9). Electronically  Signed   By: Kerby Moors M.D.   On: 11/12/2020 16:48     ASSESSMENT AND PLAN:  This is a very pleasant 70 years old African-American female with a recurrent non-small cell lung cancer initially diagnosed as stage IIIA non-small cell lung cancer status post concurrent chemoradiation followed by consolidation chemotherapy with carboplatin and paclitaxel for 3 cycles. The patient was followed by observation but restaging imaging studies showed evidence for disease progression. She was started on second line treatment with immunotherapy with Nivolumab 480 mg IV every 4 weeks status post 32 cycles.   The patient has been in observation since August 2021 with no concerning complaints except for the baseline shortness of breath and intermittent headache. Recent imaging studies including CT scan of the chest  scan showed development of right apical lung mass consistent with recurrent/metastatic disease.  I ordered a PET scan which was performed recently.  I personally and independently reviewed the scan images and discussed the results with the patient today. Her scan showed persistent right upper lobe hypermetabolic activity still suspicious for bronchogenic carcinoma but inflammatory process could be contributing to the hypermetabolic activity. The patient resumed her treatment with cycle number cycle #33 of single agent nivolumab 480 mg IV every 4 weeks in March 2022 status post 37 cycles. The patient continues to tolerate this treatment well with no concerning adverse effects. Her treatment was delayed by 2 weeks secondary to worsening COPD and the patient was started on a tapered dose of prednisone. She is here today for evaluation before resuming her treatment and she is feeling fine. We will proceed with cycle #38 today as planned. I will see her back for follow-up visit in 4 weeks for evaluation before the next cycle of her treatment. For the COPD, the patient will continue with her home oxygen. She was advised to call immediately if she has any other concerning symptoms in the interval.  All questions were answered. The patient knows to call the clinic with any problems, questions or concerns. We can certainly see the patient much sooner if necessary.  Disclaimer: This note was dictated with voice recognition software. Similar sounding words can inadvertently be transcribed and may not be corrected upon review.

## 2020-12-05 NOTE — Patient Instructions (Signed)
[facility]  Discharge Instructions: Thank you for choosing [facility] to provide your oncology and hematology care.   If you have a lab appointment with the [facility], please go directly to the [facility] and check in at the registration area.   Wear comfortable clothing and clothing appropriate for easy access to any Portacath or PICC line.   We strive to give you quality time with your provider. You may need to reschedule your appointment if you arrive late (15 or more minutes).  Arriving late affects you and other patients whose appointments are after yours.  Also, if you miss three or more appointments without notifying the office, you may be dismissed from the clinic at the provider's discretion.      For prescription refill requests, have your pharmacy contact our office and allow 72 hours for refills to be completed.    Today you received the following chemotherapy and/or immunotherapy agents Nivolumab (Opdivo)       To help prevent nausea and vomiting after your treatment, we encourage you to take your nausea medication as directed.  BELOW ARE SYMPTOMS THAT SHOULD BE REPORTED IMMEDIATELY: *FEVER GREATER THAN 100.4 F (38 C) OR HIGHER *CHILLS OR SWEATING *NAUSEA AND VOMITING THAT IS NOT CONTROLLED WITH YOUR NAUSEA MEDICATION *UNUSUAL SHORTNESS OF BREATH *UNUSUAL BRUISING OR BLEEDING *URINARY PROBLEMS (pain or burning when urinating, or frequent urination) *BOWEL PROBLEMS (unusual diarrhea, constipation, pain near the anus) TENDERNESS IN MOUTH AND THROAT WITH OR WITHOUT PRESENCE OF ULCERS (sore throat, sores in mouth, or a toothache) UNUSUAL RASH, SWELLING OR PAIN  UNUSUAL VAGINAL DISCHARGE OR ITCHING   Items with * indicate a potential emergency and should be followed up as soon as possible or go to the Emergency Department if any problems should occur.  Please show the CHEMOTHERAPY ALERT CARD or IMMUNOTHERAPY ALERT CARD at  check-in to the Emergency Department and triage nurse.  Should you have questions after your visit or need to cancel or reschedule your appointment, please contact [facility]  Dept: [phone]  and follow the prompts.  Office hours are 8:00 a.m. to 4:30 p.m. Monday - Friday. Please note that voicemails left after 4:00 p.m. may not be returned until the following business day.  We are closed weekends and major holidays. You have access to a nurse at all times for urgent questions. Please call the main number to the clinic Dept: [phone] and follow the prompts.   For any non-urgent questions, you may also contact your provider using MyChart. We now offer e-Visits for anyone 52 and older to request care online for non-urgent symptoms. For details visit [email or web address].   Also download the MyChart app! Go to the app store, search "MyChart", open the app, select [facility], and log in with your MyChart username and password.  Due to Covid, a mask is required upon entering the hospital/clinic. If you do not have a mask, one will be given to you upon arrival. For doctor visits, patients may have 1 support person aged 59 or older with them. For treatment visits, patients cannot have anyone with them due to current Covid guidelines and our immunocompromised population.

## 2020-12-11 ENCOUNTER — Other Ambulatory Visit: Payer: Self-pay | Admitting: Pulmonary Disease

## 2020-12-11 DIAGNOSIS — J449 Chronic obstructive pulmonary disease, unspecified: Secondary | ICD-10-CM

## 2020-12-11 DIAGNOSIS — C3411 Malignant neoplasm of upper lobe, right bronchus or lung: Secondary | ICD-10-CM

## 2020-12-13 ENCOUNTER — Other Ambulatory Visit: Payer: Medicare HMO

## 2020-12-13 ENCOUNTER — Ambulatory Visit: Payer: Medicare HMO | Admitting: Internal Medicine

## 2020-12-13 ENCOUNTER — Ambulatory Visit: Payer: Medicare HMO

## 2020-12-20 ENCOUNTER — Other Ambulatory Visit: Payer: Medicare HMO

## 2020-12-20 ENCOUNTER — Ambulatory Visit: Payer: Medicare HMO | Admitting: Physician Assistant

## 2020-12-20 ENCOUNTER — Ambulatory Visit: Payer: Medicare HMO

## 2020-12-28 ENCOUNTER — Other Ambulatory Visit: Payer: Self-pay | Admitting: Pulmonary Disease

## 2020-12-28 ENCOUNTER — Encounter (HOSPITAL_COMMUNITY)
Admission: RE | Admit: 2020-12-28 | Discharge: 2020-12-28 | Disposition: A | Discharge status: Home / Self Care | Payer: Medicare HMO | Source: Ambulatory Visit | Attending: Pulmonary Disease | Admitting: Pulmonary Disease

## 2020-12-28 ENCOUNTER — Other Ambulatory Visit: Payer: Self-pay

## 2020-12-28 DIAGNOSIS — C3411 Malignant neoplasm of upper lobe, right bronchus or lung: Secondary | ICD-10-CM | POA: Insufficient documentation

## 2020-12-28 DIAGNOSIS — J449 Chronic obstructive pulmonary disease, unspecified: Secondary | ICD-10-CM

## 2020-12-28 LAB — GLUCOSE, CAPILLARY: Glucose-Capillary: 85 mg/dL (ref 70–99)

## 2020-12-28 MED ORDER — FLUDEOXYGLUCOSE F - 18 (FDG) INJECTION
7.0000 | Freq: Once | INTRAVENOUS | Status: AC | PRN
  Administered 2020-12-28: 6.93 via INTRAVENOUS

## 2021-01-01 ENCOUNTER — Encounter: Payer: Self-pay | Admitting: Internal Medicine

## 2021-01-01 ENCOUNTER — Inpatient Hospital Stay: Payer: Medicare HMO

## 2021-01-01 ENCOUNTER — Encounter: Payer: Self-pay | Admitting: *Deleted

## 2021-01-01 ENCOUNTER — Inpatient Hospital Stay: Payer: Medicare HMO | Attending: Physician Assistant | Admitting: Internal Medicine

## 2021-01-01 ENCOUNTER — Other Ambulatory Visit: Payer: Self-pay

## 2021-01-01 VITALS — HR 98

## 2021-01-01 VITALS — BP 114/72 | HR 104 | Temp 96.4°F | Resp 18 | Wt 142.2 lb

## 2021-01-01 DIAGNOSIS — C3491 Malignant neoplasm of unspecified part of right bronchus or lung: Secondary | ICD-10-CM

## 2021-01-01 DIAGNOSIS — Z79899 Other long term (current) drug therapy: Secondary | ICD-10-CM | POA: Insufficient documentation

## 2021-01-01 DIAGNOSIS — Z5112 Encounter for antineoplastic immunotherapy: Secondary | ICD-10-CM | POA: Diagnosis not present

## 2021-01-01 DIAGNOSIS — C342 Malignant neoplasm of middle lobe, bronchus or lung: Secondary | ICD-10-CM | POA: Diagnosis present

## 2021-01-01 LAB — CBC WITH DIFFERENTIAL (CANCER CENTER ONLY)
Abs Immature Granulocytes: 0.01 10*3/uL (ref 0.00–0.07)
Basophils Absolute: 0 10*3/uL (ref 0.0–0.1)
Basophils Relative: 0 %
Eosinophils Absolute: 0.1 10*3/uL (ref 0.0–0.5)
Eosinophils Relative: 2 %
HCT: 40.3 % (ref 36.0–46.0)
Hemoglobin: 14.2 g/dL (ref 12.0–15.0)
Immature Granulocytes: 0 %
Lymphocytes Relative: 22 %
Lymphs Abs: 1.2 10*3/uL (ref 0.7–4.0)
MCH: 29.2 pg (ref 26.0–34.0)
MCHC: 35.2 g/dL (ref 30.0–36.0)
MCV: 82.9 fL (ref 80.0–100.0)
Monocytes Absolute: 0.5 10*3/uL (ref 0.1–1.0)
Monocytes Relative: 9 %
Neutro Abs: 3.6 10*3/uL (ref 1.7–7.7)
Neutrophils Relative %: 67 %
Platelet Count: 205 10*3/uL (ref 150–400)
RBC: 4.86 MIL/uL (ref 3.87–5.11)
RDW: 15.1 % (ref 11.5–15.5)
WBC Count: 5.5 10*3/uL (ref 4.0–10.5)
nRBC: 0 % (ref 0.0–0.2)

## 2021-01-01 LAB — CMP (CANCER CENTER ONLY)
ALT: 15 U/L (ref 0–44)
AST: 24 U/L (ref 15–41)
Albumin: 3.9 g/dL (ref 3.5–5.0)
Alkaline Phosphatase: 347 U/L — ABNORMAL HIGH (ref 38–126)
Anion gap: 11 (ref 5–15)
BUN: 17 mg/dL (ref 8–23)
CO2: 28 mmol/L (ref 22–32)
Calcium: 9.6 mg/dL (ref 8.9–10.3)
Chloride: 105 mmol/L (ref 98–111)
Creatinine: 0.71 mg/dL (ref 0.44–1.00)
GFR, Estimated: >60 mL/min (ref >60)
Glucose, Bld: 93 mg/dL (ref 70–99)
Potassium: 4.2 mmol/L (ref 3.5–5.1)
Sodium: 144 mmol/L (ref 135–145)
Total Bilirubin: <0.2 mg/dL — ABNORMAL LOW (ref 0.3–1.2)
Total Protein: 7.1 g/dL (ref 6.5–8.1)

## 2021-01-01 LAB — TSH: TSH: 2.733 u[IU]/mL (ref 0.308–3.960)

## 2021-01-01 MED ORDER — SODIUM CHLORIDE 0.9 % IV SOLN: Freq: Once | INTRAVENOUS | Status: AC

## 2021-01-01 MED ORDER — SODIUM CHLORIDE 0.9 % IV SOLN
480.0000 mg | Freq: Once | INTRAVENOUS | Status: AC
  Administered 2021-01-01: 480 mg via INTRAVENOUS
  Filled 2021-01-01: qty 48

## 2021-01-01 NOTE — Progress Notes (Signed)
I was able to speak to Courtney Zuniga today during her visit with Dr. Julien Nordmann.  She is doing well but does complain of occasional headache and Dr. Julien Nordmann addressed this issue.  Patient is to continue current treatment plan and verbalized understanding.  No barriers identified at this time.

## 2021-01-01 NOTE — Patient Instructions (Signed)
[facility]COL[facility]  Discharge Instructions: Thank you for choosing [facility] to provide your oncology and hematology care.   If you have a lab appointment with the [facility], please go directly to the [facility] and check in at the registration area.   Wear comfortable clothing and clothing appropriate for easy access to any Portacath or PICC line.   We strive to give you quality time with your provider. You may need to reschedule your appointment if you arrive late (15 or more minutes).  Arriving late affects you and other patients whose appointments are after yours.  Also, if you miss three or more appointments without notifying the office, you may be dismissed from the clinic at the provider's discretion.      For prescription refill requests, have your pharmacy contact our office and allow 72 hours for refills to be completed.    Today you received the following chemotherapy and/or immunotherapy agents opdivo   To help prevent nausea and vomiting after your treatment, we encourage you to take your nausea medication as directed.  BELOW ARE SYMPTOMS THAT SHOULD BE REPORTED IMMEDIATELY: *FEVER GREATER THAN 100.4 F (38 C) OR HIGHER *CHILLS OR SWEATING *NAUSEA AND VOMITING THAT IS NOT CONTROLLED WITH YOUR NAUSEA MEDICATION *UNUSUAL SHORTNESS OF BREATH *UNUSUAL BRUISING OR BLEEDING *URINARY PROBLEMS (pain or burning when urinating, or frequent urination) *BOWEL PROBLEMS (unusual diarrhea, constipation, pain near the anus) TENDERNESS IN MOUTH AND THROAT WITH OR WITHOUT PRESENCE OF ULCERS (sore throat, sores in mouth, or a toothache) UNUSUAL RASH, SWELLING OR PAIN  UNUSUAL VAGINAL DISCHARGE OR ITCHING   Items with * indicate a potential emergency and should be followed up as soon as possible or go to the Emergency Department if any problems should occur.  Please show the CHEMOTHERAPY ALERT CARD or IMMUNOTHERAPY ALERT CARD at check-in to the  Emergency Department and triage nurse.  Should you have questions after your visit or need to cancel or reschedule your appointment, please contact [facility]  Dept: [phone]  and follow the prompts.  Office hours are 8:00 a.m. to 4:30 p.m. Monday - Friday. Please note that voicemails left after 4:00 p.m. may not be returned until the following business day.  We are closed weekends and major holidays. You have access to a nurse at all times for urgent questions. Please call the main number to the clinic Dept: [phone] and follow the prompts.   For any non-urgent questions, you may also contact your provider using MyChart. We now offer e-Visits for anyone 43 and older to request care online for non-urgent symptoms. For details visit [email or web address].   Also download the MyChart app! Go to the app store, search "MyChart", open the app, select [facility], and log in with your MyChart username and password.  Due to Covid, a mask is required upon entering the hospital/clinic. If you do not have a mask, one will be given to you upon arrival. For doctor visits, patients may have 1 support person aged 16 or older with them. For treatment visits, patients cannot have anyone with them due to current Covid guidelines and our immunocompromised population.

## 2021-01-01 NOTE — Progress Notes (Signed)
[facility] [phone]   [phone]  OFFICE PROGRESS NOTE  [name], [place] [address]  DIAGNOSIS: Recurrent non-small cell lung cancer initially diagnosed as stage IIIA (T1b, N2, M0) non-small cell lung cancer presented with right middle lobe pulmonary nodule, mediastinal lymphadenopathy and highly suspicious for small nodule in the left upper lobe that could change her stage to stage IV that could present another synchronous primary lesion in the left upper lobe. This was diagnosed in September 2017.  PRIOR THERAPY:  1) Concurrent chemoradiation with weekly carboplatin for AUC of 2 and paclitaxel 45 MG/M2 status post 6 cycles last dose was given 02/25/2016 with partial response. 2) Consolidation chemotherapy with carboplatin for AUC of 5 and paclitaxel 175 MG/M2 every 3 weeks with Neulasta support. First dose 04/22/2016. Status post 3 cycles. 3) Second line treatment with immunotherapy with Nivolumab 480 mg IV every 4 weeks status post 32 cycles.  Discontinued secondary to intolerance and frequent hospitalization with pneumonia and pneumonitis.  She had a break of treatment between November 24, 2019 until June 28, 2020.  CURRENT THERAPY: Resuming her treatment with immunotherapy with nivolumab 480 mg IV every 4 weeks.  Cycle #33 started on June 28, 2020.  Status post 38 cycles.  INTERVAL HISTORY: Geri Moccia 70 y.o. female returns to the clinic today for follow-up visit.  The patient is feeling fine today with no concerning complaints except for the recurrent headaches.  She had imaging of the study with MRI of the brain several times recently with no concerning findings.  She will see her ophthalmologist for evaluation.  She denied having any current chest pain, but has shortness of breath with exertion and she is currently on home oxygen.  The patient denied having any cough or hemoptysis.  She denied having any fever or  chills.  She has no nausea, vomiting, diarrhea or constipation.  She has no recent weight loss or night sweats.  She continues to tolerate her treatment with nivolumab fairly well.  The patient had a PET scan performed recently and she is here for evaluation and discussion of her scan results and treatment options.   MEDICAL HISTORY: Past Medical History:  Diagnosis Date   Anemia    as a young woman   Arthritis    osteoartritis   Asthma    Brain tumor (benign) ([place]) 2005 [facility]   Benign   Chronic headaches    Chronic hip pain    Chronic pain    COPD (chronic obstructive pulmonary disease) ([facility])    Coronary artery disease    Depression    Depression 05/15/2016   Encounter for antineoplastic chemotherapy 01/10/2016   GERD (gastroesophageal reflux disease)    Hypertension    Lung cancer ([place]) dx'd 01/2016   currently on chemo and radiation    NSTEMI (non-ST elevated myocardial infarction) ([place]) yrs ago   On home O2    qhs 2 liters at hs and prn   Pneumonia last time 2 yrs ago   Shortness of breath dyspnea    with activity    ALLERGIES:  has No Known Allergies.  MEDICATIONS:  Current Outpatient Medications  Medication Sig Dispense Refill   acetaminophen (TYLENOL) 325 MG tablet Take 2 tablets (650 mg total) by mouth every 6 (six) hours as needed for mild pain, fever or headache. 12 tablet 2   albuterol (PROVENTIL HFA;VENTOLIN HFA) 108 (90 BASE) MCG/ACT inhaler Inhale 2 puffs into  the lungs every 4 (four) hours as needed for shortness of breath.      atorvastatin (LIPITOR) 10 MG tablet Take 10 mg by mouth daily.     Cholecalciferol (VITAMIN D3) 125 MCG (5000 UT) CAPS Take 1 capsule by mouth daily.  0   cyclobenzaprine (FLEXERIL) 10 MG tablet Take 1 tablet (10 mg total) by mouth 2 (two) times daily. *MAy take one additional tablet as needed for muscle spasms (Patient taking differently: Take 10 mg by mouth 2 (two) times daily. *May take one additional tablet as needed for muscle  spasms) 60 tablet 2   dextromethorphan-guaiFENesin (MUCINEX DM) 30-600 MG 12hr tablet Take 1 tablet by mouth 2 (two) times daily. 40 tablet 0   diclofenac sodium (VOLTAREN) 1 % GEL Apply topically 4 (four) times daily as needed (massge gel into affected area(s) as needed for pain).   1   DULoxetine (CYMBALTA) 60 MG capsule Cymbalta 60 mg capsule,delayed release  Take 1 capsule every day by oral route.  stop 30 mg capsules     Dupilumab (DUPIXENT) 300 MG/2ML SOPN Inject 300 mg into the skin every 14 (fourteen) days. Twice a month     ENSURE (ENSURE) Take 237 mLs by mouth 3 (three) times daily between meals.     HYDROcodone bit-homatropine (HYCODAN) 5-1.5 MG/5ML syrup Take 5 mLs by mouth every 6 (six) hours as needed for cough. 120 mL 0   ipratropium-albuterol (DUONEB) 0.5-2.5 (3) MG/3ML SOLN Take 3 mLs by nebulization 3 (three) times daily. 360 mL 2   methylPREDNISolone (MEDROL DOSEPAK) 4 MG TBPK tablet Use as instructed 21 tablet 0   oxyCODONE-acetaminophen (PERCOCET/ROXICET) 5-325 MG tablet Take 1 tablet by mouth every 8 (eight) hours as needed for moderate pain. 12 tablet 0   topiramate (TOPAMAX) 25 MG tablet Take 1 tablet by mouth daily at bedtime X 1 week; then increase to 25mg  BID. (Patient taking differently: Take 25 mg by mouth 2 (two) times daily.) 45 tablet 2   Ipratropium-Albuterol (COMBIVENT) 20-100 MCG/ACT AERS respimat Inhale 2 puffs into the lungs 4 (four) times daily as needed.     metoprolol tartrate (LOPRESSOR) 25 MG tablet Take 1 tablet (25 mg total) by mouth 2 (two) times daily. 60 tablet 0   No current facility-administered medications for this visit.    SURGICAL HISTORY:  Past Surgical History:  Procedure Laterality Date   CHOLECYSTECTOMY     COLONOSCOPY  2015   [facility]   COLONOSCOPY     ESOPHAGOGASTRODUODENOSCOPY N/A 08/14/2015   Procedure: ESOPHAGOGASTRODUODENOSCOPY (EGD);  Surgeon: Daneil Dolin, MD;  Location: [facility];   Service: Endoscopy;  Laterality: N/A;  215    ESOPHAGOGASTRODUODENOSCOPY (EGD) WITH PROPOFOL N/A 09/13/2015   Procedure: ESOPHAGOGASTRODUODENOSCOPY (EGD) WITH PROPOFOL;  Surgeon: Milus Banister, MD;  Location: [facility];  Service: Endoscopy;  Laterality: N/A;   EUS N/A 03/12/2017   Procedure: UPPER ENDOSCOPIC ULTRASOUND (EUS) RADIAL;  Surgeon: Milus Banister, MD;  Location: [facility];  Service: Endoscopy;  Laterality: N/A;   TUMOR REMOVAL  2005   Benign   UPPER ESOPHAGEAL ENDOSCOPIC ULTRASOUND (EUS)  09/13/2015   Procedure: UPPER ESOPHAGEAL ENDOSCOPIC ULTRASOUND (EUS);  Surgeon: Milus Banister, MD;  Location: [facility];  Service: Endoscopy;;   VIDEO BRONCHOSCOPY WITH ENDOBRONCHIAL NAVIGATION N/A 12/31/2015   Procedure: VIDEO BRONCHOSCOPY WITH ENDOBRONCHIAL NAVIGATION;  Surgeon: Melrose Nakayama, MD;  Location: [facility];  Service: Thoracic;  Laterality: N/A;   VIDEO BRONCHOSCOPY WITH ENDOBRONCHIAL ULTRASOUND  N/A 11/08/2015   Procedure: VIDEO BRONCHOSCOPY WITH ENDOBRONCHIAL ULTRASOUND;  Surgeon: Ivin Poot, MD;  [facility];  Service: Thoracic;  Laterality: N/A;    REVIEW OF SYSTEMS:  Constitutional: positive for fatigue Eyes: negative Ears, nose, mouth, throat, and face: negative Respiratory: positive for dyspnea on exertion Cardiovascular: negative Gastrointestinal: negative Genitourinary:negative Integument/breast: negative Hematologic/lymphatic: negative Musculoskeletal:negative Neurological: positive for headaches Behavioral/Psych: negative Endocrine: negative Allergic/Immunologic: negative   PHYSICAL EXAMINATION: General appearance: alert, cooperative, fatigued, and no distress Head: Normocephalic, without obvious abnormality, atraumatic Neck: no adenopathy, no JVD, supple, symmetrical, trachea midline, and thyroid not enlarged, symmetric, no tenderness/mass/nodules Lymph nodes: Cervical, supraclavicular, and axillary nodes normal. Resp: clear to auscultation  bilaterally Back: symmetric, no curvature. ROM normal. No CVA tenderness. Cardio: regular rate and rhythm, S1, S2 normal, no murmur, click, rub or gallop GI: soft, non-tender; bowel sounds normal; no masses,  no organomegaly Extremities: extremities normal, atraumatic, no cyanosis or edema Neurologic: Alert and oriented X 3, normal strength and tone. Normal symmetric reflexes. Normal coordination and gait  ECOG PERFORMANCE STATUS: 1 - Symptomatic but completely ambulatory  Blood pressure 114/72, pulse (!) 104, temperature (!) 96.4 F (35.8 C), temperature source Tympanic, resp. rate 18, weight 142 lb 3.2 oz (64.5 kg), SpO2 98 %.  LABORATORY DATA: Lab Results  Component Value Date   WBC 5.5 01/01/2021   HGB 14.2 01/01/2021   HCT 40.3 01/01/2021   MCV 82.9 01/01/2021   PLT 205 01/01/2021      Chemistry      Component Value Date/Time   NA 144 01/01/2021 0912   NA 144 04/09/2017 0948   K 4.2 01/01/2021 0912   K 4.3 04/09/2017 0948   CL 105 01/01/2021 0912   CO2 28 01/01/2021 0912   CO2 25 04/09/2017 0948   BUN 17 01/01/2021 0912   BUN 16.8 04/09/2017 0948   CREATININE 0.71 01/01/2021 0912   CREATININE 0.8 04/09/2017 0948      Component Value Date/Time   CALCIUM 9.6 01/01/2021 0912   CALCIUM 9.1 04/09/2017 0948   ALKPHOS 347 (H) 01/01/2021 0912   ALKPHOS 216 (H) 04/09/2017 0948   AST 24 01/01/2021 0912   AST 12 04/09/2017 0948   ALT 15 01/01/2021 0912   ALT 13 04/09/2017 0948   BILITOT <0.2 (L) 01/01/2021 0912   BILITOT 0.22 04/09/2017 0948       RADIOGRAPHIC STUDIES:-   NM PET Image Restage (PS) Skull Base to Thigh (F-18 FDG)  Result Date: 12/30/2020 CLINICAL DATA:  Subsequent treatment strategy for lung cancer. EXAM: NUCLEAR MEDICINE PET SKULL BASE TO THIGH TECHNIQUE: 6.93 mCi F-18 FDG was injected intravenously. Full-ring PET imaging was performed from the skull base to thigh after the radiotracer. CT data was obtained and used for attenuation correction and  anatomic localization. Fasting blood glucose: 85 mg/dl COMPARISON:  PET-CT 06/18/2020 FINDINGS: Mediastinal blood pool activity: SUV max 6.93 Liver activity: SUV max NA NECK: No hypermetabolic lymph nodes in the neck. Incidental CT findings: none CHEST: Hypermetabolic spiculated mass in the RIGHT upper lobe is decreased in size measuring 2.1 x 1.6 cm compared to 3.7 by 3.3 cm. Mass is reduced in metabolic activity SUV max equal 6.3 compared SUV max equal 13.9. No hypermetabolic mediastinal lymph nodes. No supraclavicular nodes. Incidental CT findings: none ABDOMEN/PELVIS: No hypermetabolic activity within the liver. Rim of metabolic activity along the RIGHT margin of the liver is favored physiologic diaphragmatic muscle activity. Adrenal glands normal.  No upper abdominal adenopathy. Focus of metabolic  activity superior to the LEFT operator space along the iliac vessels with SUV max equal 5.9 (image 158). Potential lymph node at this site measuring 6 mm short axis. Activity appears more lateral than expected for the ureter. Incidental CT findings: none SKELETON: No focal hypermetabolic activity to suggest skeletal metastasis. Incidental CT findings: none IMPRESSION: 1. Interval decrease in size and metabolic activity of hypermetabolic spiculated mass in the RIGHT upper lobe. 2. No evidence of metastatic mediastinal adenopathy. 3. Single new focus of metabolic activity along the LEFT iliac vessels. Indeterminate finding. Potential venous pooling. Unlikely site of solitary metastasis nodal. Recommend attention on follow-up. Electronically Signed   By: Suzy Bouchard M.D.   On: 12/30/2020 12:24     ASSESSMENT AND PLAN:  This is a very pleasant 70 years old African-American female with a recurrent non-small cell lung cancer initially diagnosed as stage IIIA non-small cell lung cancer status post concurrent chemoradiation followed by consolidation chemotherapy with carboplatin and paclitaxel for 3 cycles. The  patient was followed by observation but restaging imaging studies showed evidence for disease progression. She was started on second line treatment with immunotherapy with Nivolumab 480 mg IV every 4 weeks status post 32 cycles.   The patient has been in observation since August 2021 with no concerning complaints except for the baseline shortness of breath and intermittent headache. The patient resumed her treatment with cycle number cycle #33 of single agent nivolumab 480 mg IV every 4 weeks in March 2022 status post 38 cycles. The patient continues to tolerate this treatment well with no concerning adverse effects. She had a PET scan performed recently.  I personally and independently reviewed the scans and discussed the results with the patient today. Her scan showed interval decrease in the size and metabolic activity of the hypermetabolic spiculated mass in the right upper lobe with no evidence of metastatic mediastinal adenopathy and there was a single new focus of metabolic activity along the left iliac vessels that indeterminate.  I recommended for the patient to continue her current treatment with immunotherapy with nivolumab 480 Mg IV every 4 weeks. She will proceed with cycle #39 today. The patient will come back for follow-up visit in 4 weeks for evaluation before the next cycle of her treatment. For the COPD, she will continue with home oxygen for now. For the headache, she will see her ophthalmologist for evaluation. The patient was advised to call immediately if she has any other concerning symptoms in the interval.  All questions were answered. The patient knows to call the clinic with any problems, questions or concerns. We can certainly see the patient much sooner if necessary.  Disclaimer: This note was dictated with voice recognition software. Similar sounding words can inadvertently be transcribed and may not be corrected upon review.

## 2021-01-16 ENCOUNTER — Telehealth: Payer: Self-pay | Admitting: Medical Oncology

## 2021-01-16 NOTE — Telephone Encounter (Signed)
Confused about appts.   Her next appt is 10/19 because her tx is every 4 weeks.   I told her  Oct 6th is incorrect.  56/97- schedule conflict with provider for her leg pain.  I Jeannett Senior er it will be discussed at her Oct appt.

## 2021-01-17 ENCOUNTER — Ambulatory Visit: Payer: Medicare HMO

## 2021-01-17 ENCOUNTER — Ambulatory Visit: Payer: Medicare HMO | Admitting: Internal Medicine

## 2021-01-17 ENCOUNTER — Other Ambulatory Visit: Payer: Medicare HMO

## 2021-01-30 ENCOUNTER — Inpatient Hospital Stay: Payer: Medicare HMO

## 2021-01-30 ENCOUNTER — Other Ambulatory Visit: Payer: Self-pay

## 2021-01-30 ENCOUNTER — Inpatient Hospital Stay: Payer: Medicare HMO | Attending: Physician Assistant | Admitting: Internal Medicine

## 2021-01-30 VITALS — HR 95

## 2021-01-30 VITALS — BP 109/90 | HR 106 | Temp 97.7°F | Resp 20 | Ht 66.0 in | Wt 143.3 lb

## 2021-01-30 DIAGNOSIS — C342 Malignant neoplasm of middle lobe, bronchus or lung: Secondary | ICD-10-CM | POA: Insufficient documentation

## 2021-01-30 DIAGNOSIS — C3491 Malignant neoplasm of unspecified part of right bronchus or lung: Secondary | ICD-10-CM

## 2021-01-30 DIAGNOSIS — J449 Chronic obstructive pulmonary disease, unspecified: Secondary | ICD-10-CM

## 2021-01-30 DIAGNOSIS — Z79899 Other long term (current) drug therapy: Secondary | ICD-10-CM | POA: Diagnosis not present

## 2021-01-30 DIAGNOSIS — Z9981 Dependence on supplemental oxygen: Secondary | ICD-10-CM

## 2021-01-30 DIAGNOSIS — Z5112 Encounter for antineoplastic immunotherapy: Secondary | ICD-10-CM | POA: Diagnosis not present

## 2021-01-30 DIAGNOSIS — R519 Headache, unspecified: Secondary | ICD-10-CM | POA: Diagnosis not present

## 2021-01-30 LAB — CBC WITH DIFFERENTIAL (CANCER CENTER ONLY)
Abs Immature Granulocytes: 0.01 10*3/uL (ref 0.00–0.07)
Basophils Absolute: 0 10*3/uL (ref 0.0–0.1)
Basophils Relative: 1 %
Eosinophils Absolute: 0.1 10*3/uL (ref 0.0–0.5)
Eosinophils Relative: 1 %
HCT: 39.2 % (ref 36.0–46.0)
Hemoglobin: 13.6 g/dL (ref 12.0–15.0)
Immature Granulocytes: 0 %
Lymphocytes Relative: 29 %
Lymphs Abs: 1.5 10*3/uL (ref 0.7–4.0)
MCH: 29.2 pg (ref 26.0–34.0)
MCHC: 34.7 g/dL (ref 30.0–36.0)
MCV: 84.1 fL (ref 80.0–100.0)
Monocytes Absolute: 0.4 10*3/uL (ref 0.1–1.0)
Monocytes Relative: 7 %
Neutro Abs: 3.2 10*3/uL (ref 1.7–7.7)
Neutrophils Relative %: 62 %
Platelet Count: 212 10*3/uL (ref 150–400)
RBC: 4.66 MIL/uL (ref 3.87–5.11)
RDW: 15.2 % (ref 11.5–15.5)
WBC Count: 5.2 10*3/uL (ref 4.0–10.5)
nRBC: 0 % (ref 0.0–0.2)

## 2021-01-30 LAB — CMP (CANCER CENTER ONLY)
ALT: 13 U/L (ref 0–44)
AST: 23 U/L (ref 15–41)
Albumin: 3.8 g/dL (ref 3.5–5.0)
Alkaline Phosphatase: 307 U/L — ABNORMAL HIGH (ref 38–126)
Anion gap: 11 (ref 5–15)
BUN: 12 mg/dL (ref 8–23)
CO2: 26 mmol/L (ref 22–32)
Calcium: 9.6 mg/dL (ref 8.9–10.3)
Chloride: 109 mmol/L (ref 98–111)
Creatinine: 0.64 mg/dL (ref 0.44–1.00)
GFR, Estimated: >60 mL/min (ref >60)
Glucose, Bld: 67 mg/dL — ABNORMAL LOW (ref 70–99)
Potassium: 3.9 mmol/L (ref 3.5–5.1)
Sodium: 146 mmol/L — ABNORMAL HIGH (ref 135–145)
Total Bilirubin: <0.2 mg/dL — ABNORMAL LOW (ref 0.3–1.2)
Total Protein: 6.9 g/dL (ref 6.5–8.1)

## 2021-01-30 LAB — TSH: TSH: 1.358 u[IU]/mL (ref 0.308–3.960)

## 2021-01-30 MED ORDER — SODIUM CHLORIDE 0.9 % IV SOLN: Freq: Once | INTRAVENOUS | Status: AC

## 2021-01-30 MED ORDER — SODIUM CHLORIDE 0.9 % IV SOLN
480.0000 mg | Freq: Once | INTRAVENOUS | Status: AC
  Administered 2021-01-30: 480 mg via INTRAVENOUS
  Filled 2021-01-30: qty 48

## 2021-01-30 NOTE — Progress Notes (Signed)
[facility] [phone]   [phone]  OFFICE PROGRESS NOTE  [name], [place] [address]  DIAGNOSIS: Recurrent non-small cell lung cancer initially diagnosed as stage IIIA (T1b, N2, M0) non-small cell lung cancer presented with right middle lobe pulmonary nodule, mediastinal lymphadenopathy and highly suspicious for small nodule in the left upper lobe that could change her stage to stage IV that could present another synchronous primary lesion in the left upper lobe. This was diagnosed in September 2017.  PRIOR THERAPY:  1) Concurrent chemoradiation with weekly carboplatin for AUC of 2 and paclitaxel 45 MG/M2 status post 6 cycles last dose was given 02/25/2016 with partial response. 2) Consolidation chemotherapy with carboplatin for AUC of 5 and paclitaxel 175 MG/M2 every 3 weeks with Neulasta support. First dose 04/22/2016. Status post 3 cycles. 3) Second line treatment with immunotherapy with Nivolumab 480 mg IV every 4 weeks status post 32 cycles.  Discontinued secondary to intolerance and frequent hospitalization with pneumonia and pneumonitis.  She had a break of treatment between November 24, 2019 until June 28, 2020.  CURRENT THERAPY: Resuming her treatment with immunotherapy with nivolumab 480 mg IV every 4 weeks.  Cycle #33 started on June 28, 2020.  Status post 39 cycles.  INTERVAL HISTORY: Courtney Zuniga 70 y.o. female returns to the clinic today for follow-up visit.  The patient is feeling fine today with no concerning complaints except for mild cough and shortness of breath at baseline increased with exertion.  She is currently on home oxygen.  She denied having any chest pain or hemoptysis.  She has no nausea, vomiting, diarrhea or constipation.  She has no fever or chills.  She denied having any significant weight loss or night sweats.  She has occasional headache.  She is here today for evaluation before starting  cycle #40 of her treatment.   MEDICAL HISTORY: Past Medical History:  Diagnosis Date   Anemia    as a young woman   Arthritis    osteoartritis   Asthma    Brain tumor (benign) ([facility]) 2005 [facility]   Benign   Chronic headaches    Chronic hip pain    Chronic pain    COPD (chronic obstructive pulmonary disease) (HCC)    Coronary artery disease    Depression    Depression 05/15/2016   Encounter for antineoplastic chemotherapy 01/10/2016   GERD (gastroesophageal reflux disease)    Hypertension    Lung cancer ([facility]) dx'd 01/2016   currently on chemo and radiation    NSTEMI (non-ST elevated myocardial infarction) ([facility]) yrs ago   On home O2    qhs 2 liters at hs and prn   Pneumonia last time 2 yrs ago   Shortness of breath dyspnea    with activity    ALLERGIES:  is allergic to no known allergies.  MEDICATIONS:  Current Outpatient Medications  Medication Sig Dispense Refill   acetaminophen (TYLENOL) 325 MG tablet Take 2 tablets (650 mg total) by mouth every 6 (six) hours as needed for mild pain, fever or headache. 12 tablet 2   albuterol (PROVENTIL HFA;VENTOLIN HFA) 108 (90 BASE) MCG/ACT inhaler Inhale 2 puffs into the lungs every 4 (four) hours as needed for shortness of breath.      atorvastatin (LIPITOR) 10 MG tablet Take 10 mg by mouth daily.     Cholecalciferol (VITAMIN D3) 125 MCG (5000 UT) CAPS Take 1 capsule by mouth daily.  0  cyclobenzaprine (FLEXERIL) 10 MG tablet Take 1 tablet (10 mg total) by mouth 2 (two) times daily. *MAy take one additional tablet as needed for muscle spasms (Patient taking differently: Take 10 mg by mouth 2 (two) times daily. *May take one additional tablet as needed for muscle spasms) 60 tablet 2   dextromethorphan-guaiFENesin (MUCINEX DM) 30-600 MG 12hr tablet Take 1 tablet by mouth 2 (two) times daily. 40 tablet 0   diclofenac sodium (VOLTAREN) 1 % GEL Apply topically 4 (four) times daily as needed (massge gel into affected area(s) as needed for  pain).   1   DULoxetine (CYMBALTA) 60 MG capsule Cymbalta 60 mg capsule,delayed release  Take 1 capsule every day by oral route.  stop 30 mg capsules     Dupilumab (DUPIXENT) 300 MG/2ML SOPN Inject 300 mg into the skin every 14 (fourteen) days. Twice a month     ENSURE (ENSURE) Take 237 mLs by mouth 3 (three) times daily between meals.     HYDROcodone bit-homatropine (HYCODAN) 5-1.5 MG/5ML syrup Take 5 mLs by mouth every 6 (six) hours as needed for cough. 120 mL 0   Ipratropium-Albuterol (COMBIVENT RESPIMAT) 20-100 MCG/ACT AERS respimat INHALE 2 PUFFS BY MOUTH FOUR TIMES DAILY AS NEEDED FOR WHEEZING     ipratropium-albuterol (DUONEB) 0.5-2.5 (3) MG/3ML SOLN Take 3 mLs by nebulization 3 (three) times daily. 360 mL 2   oxyCODONE-acetaminophen (PERCOCET/ROXICET) 5-325 MG tablet Take 1 tablet by mouth every 8 (eight) hours as needed for moderate pain. 12 tablet 0   topiramate (TOPAMAX) 25 MG tablet Take 1 tablet by mouth daily at bedtime X 1 week; then increase to 25mg  BID. (Patient taking differently: Take 25 mg by mouth 2 (two) times daily.) 45 tablet 2   Ipratropium-Albuterol (COMBIVENT) 20-100 MCG/ACT AERS respimat Inhale 2 puffs into the lungs 4 (four) times daily as needed.     methylPREDNISolone (MEDROL DOSEPAK) 4 MG TBPK tablet Use as instructed (Patient not taking: Reported on 01/30/2021) 21 tablet 0   metoprolol tartrate (LOPRESSOR) 25 MG tablet Take 1 tablet (25 mg total) by mouth 2 (two) times daily. 60 tablet 0   No current facility-administered medications for this visit.    SURGICAL HISTORY:  Past Surgical History:  Procedure Laterality Date   CHOLECYSTECTOMY     COLONOSCOPY  2015   [facility] requested from [facility]   COLONOSCOPY     ESOPHAGOGASTRODUODENOSCOPY N/A 08/14/2015   Procedure: ESOPHAGOGASTRODUODENOSCOPY (EGD);  Surgeon: Daneil Dolin, MD;  [facility];  Service: Endoscopy;  Laterality: N/A;  215    ESOPHAGOGASTRODUODENOSCOPY (EGD) WITH PROPOFOL  N/A 09/13/2015   Procedure: ESOPHAGOGASTRODUODENOSCOPY (EGD) WITH PROPOFOL;  Surgeon: Milus Banister, MD;  Location: [facility];  Service: Endoscopy;  Laterality: N/A;   EUS N/A 03/12/2017   Procedure: UPPER ENDOSCOPIC ULTRASOUND (EUS) RADIAL;  Surgeon: Milus Banister, MD;  Location: [facility];  Service: Endoscopy;  Laterality: N/A;   TUMOR REMOVAL  2005   Benign   UPPER ESOPHAGEAL ENDOSCOPIC ULTRASOUND (EUS)  09/13/2015   Procedure: UPPER ESOPHAGEAL ENDOSCOPIC ULTRASOUND (EUS);  Surgeon: Milus Banister, MD;  Location: [facility];  Service: Endoscopy;;   VIDEO BRONCHOSCOPY WITH ENDOBRONCHIAL NAVIGATION N/A 12/31/2015   Procedure: VIDEO BRONCHOSCOPY WITH ENDOBRONCHIAL NAVIGATION;  Surgeon: Melrose Nakayama, MD;  Location: [facility];  Service: Thoracic;  Laterality: N/A;   VIDEO BRONCHOSCOPY WITH ENDOBRONCHIAL ULTRASOUND N/A 11/08/2015   Procedure: VIDEO BRONCHOSCOPY WITH ENDOBRONCHIAL ULTRASOUND;  Surgeon: Ivin Poot, MD;  Location: [facility];  Service: Thoracic;  Laterality: N/A;    REVIEW OF SYSTEMS:  A comprehensive review of systems was negative except for: Constitutional: positive for fatigue Respiratory: positive for cough and dyspnea on exertion   PHYSICAL EXAMINATION: General appearance: alert, cooperative, fatigued, and no distress Head: Normocephalic, without obvious abnormality, atraumatic Neck: no adenopathy, no JVD, supple, symmetrical, trachea midline, and thyroid not enlarged, symmetric, no tenderness/mass/nodules Lymph nodes: Cervical, supraclavicular, and axillary nodes normal. Resp: clear to auscultation bilaterally Back: symmetric, no curvature. ROM normal. No CVA tenderness. Cardio: regular rate and rhythm, S1, S2 normal, no murmur, click, rub or gallop GI: soft, non-tender; bowel sounds normal; no masses,  no organomegaly Extremities: extremities normal, atraumatic, no cyanosis or edema  ECOG PERFORMANCE STATUS: 1 - Symptomatic but completely ambulatory  Blood  pressure 109/90, pulse (!) 106, temperature 97.7 F (36.5 C), temperature source Tympanic, resp. rate 20, height 5\' 6"  (1.676 m), weight 143 lb 4.8 oz (65 kg), SpO2 93 %.  LABORATORY DATA: Lab Results  Component Value Date   WBC 5.2 01/30/2021   HGB 13.6 01/30/2021   HCT 39.2 01/30/2021   MCV 84.1 01/30/2021   PLT 212 01/30/2021      Chemistry      Component Value Date/Time   NA 146 (H) 01/30/2021 1320   NA 144 04/09/2017 0948   K 3.9 01/30/2021 1320   K 4.3 04/09/2017 0948   CL 109 01/30/2021 1320   CO2 26 01/30/2021 1320   CO2 25 04/09/2017 0948   BUN 12 01/30/2021 1320   BUN 16.8 04/09/2017 0948   CREATININE 0.64 01/30/2021 1320   CREATININE 0.8 04/09/2017 0948      Component Value Date/Time   CALCIUM 9.6 01/30/2021 1320   CALCIUM 9.1 04/09/2017 0948   ALKPHOS 307 (H) 01/30/2021 1320   ALKPHOS 216 (H) 04/09/2017 0948   AST 23 01/30/2021 1320   AST 12 04/09/2017 0948   ALT 13 01/30/2021 1320   ALT 13 04/09/2017 0948   BILITOT <0.2 (L) 01/30/2021 1320   BILITOT 0.22 04/09/2017 0948       RADIOGRAPHIC STUDIES:-   No results found.   ASSESSMENT AND PLAN:  This is a very pleasant 70 years old African-American female with a recurrent non-small cell lung cancer initially diagnosed as stage IIIA non-small cell lung cancer status post concurrent chemoradiation followed by consolidation chemotherapy with carboplatin and paclitaxel for 3 cycles. The patient was followed by observation but restaging imaging studies showed evidence for disease progression. She was started on second line treatment with immunotherapy with Nivolumab 480 mg IV every 4 weeks status post 32 cycles.   The patient has been in observation since August 2021 with no concerning complaints except for the baseline shortness of breath and intermittent headache. The patient resumed her treatment with cycle number cycle #33 of single agent nivolumab 480 mg IV every 4 weeks in March 2022 status post 39  cycles. The patient continues to tolerate this treatment well with no concerning complaints except for the baseline fatigue and shortness of breath. I recommended for her to proceed with cycle #4 today as planned. I will see her back for follow-up visit in 4 weeks for evaluation before starting cycle #41. For the COPD, she will continue with home oxygen for now. For the headache, she will see her ophthalmologist for evaluation. The patient was advised to call immediately if she has any concerning symptoms in the interval.  All questions were answered. The patient knows to call the clinic  with any problems, questions or concerns. We can certainly see the patient much sooner if necessary.  Disclaimer: This note was dictated with voice recognition software. Similar sounding words can inadvertently be transcribed and may not be corrected upon review.

## 2021-01-30 NOTE — Patient Instructions (Signed)
[facility]  Discharge Instructions: Thank you for choosing [facility] to provide your oncology and hematology care.   If you have a lab appointment with the [facility], please go directly to the [facility] and check in at the registration area.   Wear comfortable clothing and clothing appropriate for easy access to any Portacath or PICC line.   We strive to give you quality time with your provider. You may need to reschedule your appointment if you arrive late (15 or more minutes).  Arriving late affects you and other patients whose appointments are after yours.  Also, if you miss three or more appointments without notifying the office, you may be dismissed from the clinic at the provider's discretion.      For prescription refill requests, have your pharmacy contact our office and allow 72 hours for refills to be completed.    Today you received the following chemotherapy and/or immunotherapy agents opdivo   To help prevent nausea and vomiting after your treatment, we encourage you to take your nausea medication as directed.  BELOW ARE SYMPTOMS THAT SHOULD BE REPORTED IMMEDIATELY: *FEVER GREATER THAN 100.4 F (38 C) OR HIGHER *CHILLS OR SWEATING *NAUSEA AND VOMITING THAT IS NOT CONTROLLED WITH YOUR NAUSEA MEDICATION *UNUSUAL SHORTNESS OF BREATH *UNUSUAL BRUISING OR BLEEDING *URINARY PROBLEMS (pain or burning when urinating, or frequent urination) *BOWEL PROBLEMS (unusual diarrhea, constipation, pain near the anus) TENDERNESS IN MOUTH AND THROAT WITH OR WITHOUT PRESENCE OF ULCERS (sore throat, sores in mouth, or a toothache) UNUSUAL RASH, SWELLING OR PAIN  UNUSUAL VAGINAL DISCHARGE OR ITCHING   Items with * indicate a potential emergency and should be followed up as soon as possible or go to the Emergency Department if any problems should occur.  Please show the CHEMOTHERAPY ALERT CARD or IMMUNOTHERAPY ALERT CARD at check-in to the  Emergency Department and triage nurse.  Should you have questions after your visit or need to cancel or reschedule your appointment, please contact [facility]  Dept: [phone]  and follow the prompts.  Office hours are 8:00 a.m. to 4:30 p.m. Monday - Friday. Please note that voicemails left after 4:00 p.m. may not be returned until the following business day.  We are closed weekends and major holidays. You have access to a nurse at all times for urgent questions. Please call the main number to the clinic Dept: [phone] and follow the prompts.   For any non-urgent questions, you may also contact your provider using MyChart. We now offer e-Visits for anyone 10 and older to request care online for non-urgent symptoms. For details visit [email or web address].   Also download the MyChart app! Go to the app store, search "MyChart", open the app, select [facility], and log in with your MyChart username and password.  Due to Covid, a mask is required upon entering the hospital/clinic. If you do not have a mask, one will be given to you upon arrival. For doctor visits, patients may have 1 support person aged 71 or older with them. For treatment visits, patients cannot have anyone with them due to current Covid guidelines and our immunocompromised population.

## 2021-01-31 ENCOUNTER — Telehealth: Payer: Self-pay | Admitting: Internal Medicine

## 2021-01-31 NOTE — Telephone Encounter (Signed)
Scheduled follow-up appointment per 10/19 los. Patient is aware.

## 2021-02-27 ENCOUNTER — Inpatient Hospital Stay (HOSPITAL_BASED_OUTPATIENT_CLINIC_OR_DEPARTMENT_OTHER): Payer: Medicare HMO | Admitting: Internal Medicine

## 2021-02-27 ENCOUNTER — Inpatient Hospital Stay: Payer: Medicare HMO

## 2021-02-27 ENCOUNTER — Inpatient Hospital Stay: Payer: Medicare HMO | Attending: Physician Assistant

## 2021-02-27 ENCOUNTER — Other Ambulatory Visit: Payer: Self-pay

## 2021-02-27 VITALS — HR 104

## 2021-02-27 VITALS — BP 111/76 | HR 107 | Temp 97.4°F | Resp 20 | Ht 66.0 in | Wt 142.1 lb

## 2021-02-27 DIAGNOSIS — C342 Malignant neoplasm of middle lobe, bronchus or lung: Secondary | ICD-10-CM | POA: Diagnosis present

## 2021-02-27 DIAGNOSIS — Z79899 Other long term (current) drug therapy: Secondary | ICD-10-CM | POA: Diagnosis not present

## 2021-02-27 DIAGNOSIS — C3490 Malignant neoplasm of unspecified part of unspecified bronchus or lung: Secondary | ICD-10-CM

## 2021-02-27 DIAGNOSIS — Z5112 Encounter for antineoplastic immunotherapy: Secondary | ICD-10-CM | POA: Diagnosis not present

## 2021-02-27 DIAGNOSIS — C3491 Malignant neoplasm of unspecified part of right bronchus or lung: Secondary | ICD-10-CM

## 2021-02-27 LAB — CBC WITH DIFFERENTIAL (CANCER CENTER ONLY)
Abs Immature Granulocytes: 0.01 10*3/uL (ref 0.00–0.07)
Basophils Absolute: 0 10*3/uL (ref 0.0–0.1)
Basophils Relative: 1 %
Eosinophils Absolute: 0.1 10*3/uL (ref 0.0–0.5)
Eosinophils Relative: 2 %
HCT: 43 % (ref 36.0–46.0)
Hemoglobin: 14.4 g/dL (ref 12.0–15.0)
Immature Granulocytes: 0 %
Lymphocytes Relative: 33 %
Lymphs Abs: 1.5 10*3/uL (ref 0.7–4.0)
MCH: 28.3 pg (ref 26.0–34.0)
MCHC: 33.5 g/dL (ref 30.0–36.0)
MCV: 84.6 fL (ref 80.0–100.0)
Monocytes Absolute: 0.4 10*3/uL (ref 0.1–1.0)
Monocytes Relative: 9 %
Neutro Abs: 2.7 10*3/uL (ref 1.7–7.7)
Neutrophils Relative %: 55 %
Platelet Count: 217 10*3/uL (ref 150–400)
RBC: 5.08 MIL/uL (ref 3.87–5.11)
RDW: 14.8 % (ref 11.5–15.5)
WBC Count: 4.7 10*3/uL (ref 4.0–10.5)
nRBC: 0 % (ref 0.0–0.2)

## 2021-02-27 LAB — CMP (CANCER CENTER ONLY)
ALT: 11 U/L (ref 0–44)
AST: 22 U/L (ref 15–41)
Albumin: 3.9 g/dL (ref 3.5–5.0)
Alkaline Phosphatase: 286 U/L — ABNORMAL HIGH (ref 38–126)
Anion gap: 11 (ref 5–15)
BUN: 18 mg/dL (ref 8–23)
CO2: 28 mmol/L (ref 22–32)
Calcium: 9.5 mg/dL (ref 8.9–10.3)
Chloride: 104 mmol/L (ref 98–111)
Creatinine: 0.73 mg/dL (ref 0.44–1.00)
GFR, Estimated: >60 mL/min (ref >60)
Glucose, Bld: 72 mg/dL (ref 70–99)
Potassium: 4.3 mmol/L (ref 3.5–5.1)
Sodium: 143 mmol/L (ref 135–145)
Total Bilirubin: <0.2 mg/dL — ABNORMAL LOW (ref 0.3–1.2)
Total Protein: 6.9 g/dL (ref 6.5–8.1)

## 2021-02-27 LAB — TSH: TSH: 1.904 u[IU]/mL (ref 0.308–3.960)

## 2021-02-27 MED ORDER — SODIUM CHLORIDE 0.9 % IV SOLN: Freq: Once | INTRAVENOUS | Status: AC

## 2021-02-27 MED ORDER — SODIUM CHLORIDE 0.9 % IV SOLN
480.0000 mg | Freq: Once | INTRAVENOUS | Status: AC
  Administered 2021-02-27: 480 mg via INTRAVENOUS
  Filled 2021-02-27: qty 48

## 2021-02-27 NOTE — Progress Notes (Signed)
[facility] [phone]   Fax:([phone]) [phone]  OFFICE PROGRESS NOTE  [name], [place] [address]  DIAGNOSIS: Recurrent non-small cell lung cancer initially diagnosed as stage IIIA (T1b, N2, M0) non-small cell lung cancer presented with right middle lobe pulmonary nodule, mediastinal lymphadenopathy and highly suspicious for small nodule in the left upper lobe that could change her stage to stage IV that could present another synchronous primary lesion in the left upper lobe. This was diagnosed in September 2017.  PRIOR THERAPY:  1) Concurrent chemoradiation with weekly carboplatin for AUC of 2 and paclitaxel 45 MG/M2 status post 6 cycles last dose was given 02/25/2016 with partial response. 2) Consolidation chemotherapy with carboplatin for AUC of 5 and paclitaxel 175 MG/M2 every 3 weeks with Neulasta support. First dose 04/22/2016. Status post 3 cycles. 3) Second line treatment with immunotherapy with Nivolumab 480 mg IV every 4 weeks status post 32 cycles.  Discontinued secondary to intolerance and frequent hospitalization with pneumonia and pneumonitis.  She had a break of treatment between November 24, 2019 until June 28, 2020.  CURRENT THERAPY: Resuming her treatment with immunotherapy with nivolumab 480 mg IV every 4 weeks.  Cycle #33 started on June 28, 2020.  Status post 40 cycles.  INTERVAL HISTORY: [facility] 70 y.o. female returns to the clinic today for follow-up visit.  The patient is feeling fine today with no concerning complaints.  She continues to have the baseline shortness of breath increased with exertion.  She denied having any chest pain, cough or hemoptysis.  She denied having any fever or chills.  She has no nausea, vomiting, diarrhea or constipation.  She has no headache or visual changes.  She is here today for evaluation before starting cycle #41.   MEDICAL HISTORY: Past Medical History:  Diagnosis Date    Anemia    as a young woman   Arthritis    osteoartritis   Asthma    Brain tumor (benign) ([facility]) 2005 [facility]   Benign   Chronic headaches    Chronic hip pain    Chronic pain    COPD (chronic obstructive pulmonary disease) (HCC)    Coronary artery disease    Depression    Depression 05/15/2016   Encounter for antineoplastic chemotherapy 01/10/2016   GERD (gastroesophageal reflux disease)    Hypertension    Lung cancer ([facility]) dx'd 01/2016   currently on chemo and radiation    NSTEMI (non-ST elevated myocardial infarction) ([place]) yrs ago   On home O2    qhs 2 liters at hs and prn   Pneumonia last time 2 yrs ago   Shortness of breath dyspnea    with activity    ALLERGIES:  is allergic to no known allergies.  MEDICATIONS:  Current Outpatient Medications  Medication Sig Dispense Refill   acetaminophen (TYLENOL) 325 MG tablet Take 2 tablets (650 mg total) by mouth every 6 (six) hours as needed for mild pain, fever or headache. 12 tablet 2   albuterol (PROVENTIL HFA;VENTOLIN HFA) 108 (90 BASE) MCG/ACT inhaler Inhale 2 puffs into the lungs every 4 (four) hours as needed for shortness of breath.      atorvastatin (LIPITOR) 10 MG tablet Take 10 mg by mouth daily.     Cholecalciferol (VITAMIN D3) 125 MCG (5000 UT) CAPS Take 1 capsule by mouth daily.  0   cyclobenzaprine (FLEXERIL) 10 MG tablet Take 1 tablet (10 mg total) by mouth 2 (  two) times daily. *MAy take one additional tablet as needed for muscle spasms (Patient taking differently: Take 10 mg by mouth 2 (two) times daily. *May take one additional tablet as needed for muscle spasms) 60 tablet 2   dextromethorphan-guaiFENesin (MUCINEX DM) 30-600 MG 12hr tablet Take 1 tablet by mouth 2 (two) times daily. 40 tablet 0   diclofenac sodium (VOLTAREN) 1 % GEL Apply topically 4 (four) times daily as needed (massge gel into affected area(s) as needed for pain).   1   DULoxetine (CYMBALTA) 60 MG capsule Cymbalta 60 mg capsule,delayed release   Take 1 capsule every day by oral route.  stop 30 mg capsules     Dupilumab (DUPIXENT) 300 MG/2ML SOPN Inject 300 mg into the skin every 14 (fourteen) days. Twice a month     ENSURE (ENSURE) Take 237 mLs by mouth 3 (three) times daily between meals.     HYDROcodone bit-homatropine (HYCODAN) 5-1.5 MG/5ML syrup Take 5 mLs by mouth every 6 (six) hours as needed for cough. 120 mL 0   Ipratropium-Albuterol (COMBIVENT RESPIMAT) 20-100 MCG/ACT AERS respimat INHALE 2 PUFFS BY MOUTH FOUR TIMES DAILY AS NEEDED FOR WHEEZING     Ipratropium-Albuterol (COMBIVENT) 20-100 MCG/ACT AERS respimat Inhale 2 puffs into the lungs 4 (four) times daily as needed.     ipratropium-albuterol (DUONEB) 0.5-2.5 (3) MG/3ML SOLN Take 3 mLs by nebulization 3 (three) times daily. 360 mL 2   metoprolol tartrate (LOPRESSOR) 25 MG tablet Take 1 tablet (25 mg total) by mouth 2 (two) times daily. 60 tablet 0   oxyCODONE-acetaminophen (PERCOCET/ROXICET) 5-325 MG tablet Take 1 tablet by mouth every 8 (eight) hours as needed for moderate pain. 12 tablet 0   topiramate (TOPAMAX) 25 MG tablet Take 1 tablet by mouth daily at bedtime X 1 week; then increase to 25mg  BID. (Patient taking differently: Take 25 mg by mouth 2 (two) times daily.) 45 tablet 2   No current facility-administered medications for this visit.    SURGICAL HISTORY:  Past Surgical History:  Procedure Laterality Date   CHOLECYSTECTOMY     COLONOSCOPY  2015   Results requested from [facility]   COLONOSCOPY     ESOPHAGOGASTRODUODENOSCOPY N/A 08/14/2015   Procedure: ESOPHAGOGASTRODUODENOSCOPY (EGD);  Surgeon: Daneil Dolin, MD;  [facility];  Service: Endoscopy;  Laterality: N/A;  215    ESOPHAGOGASTRODUODENOSCOPY (EGD) WITH PROPOFOL N/A 09/13/2015   Procedure: ESOPHAGOGASTRODUODENOSCOPY (EGD) WITH PROPOFOL;  Surgeon: Milus Banister, MD;  Location: [facility];  Service: Endoscopy;  Laterality: N/A;   EUS N/A 03/12/2017   Procedure: UPPER ENDOSCOPIC  ULTRASOUND (EUS) RADIAL;  Surgeon: Milus Banister, MD;  Location: [facility];  Service: Endoscopy;  Laterality: N/A;   TUMOR REMOVAL  2005   Benign   UPPER ESOPHAGEAL ENDOSCOPIC ULTRASOUND (EUS)  09/13/2015   Procedure: UPPER ESOPHAGEAL ENDOSCOPIC ULTRASOUND (EUS);  Surgeon: Milus Banister, MD;  Location: [facility];  Service: Endoscopy;;   VIDEO BRONCHOSCOPY WITH ENDOBRONCHIAL NAVIGATION N/A 12/31/2015   Procedure: VIDEO BRONCHOSCOPY WITH ENDOBRONCHIAL NAVIGATION;  Surgeon: Melrose Nakayama, MD;  Location: [facility];  Service: Thoracic;  Laterality: N/A;   VIDEO BRONCHOSCOPY WITH ENDOBRONCHIAL ULTRASOUND N/A 11/08/2015   Procedure: VIDEO BRONCHOSCOPY WITH ENDOBRONCHIAL ULTRASOUND;  Surgeon: Ivin Poot, MD;  Location: [facility];  Service: Thoracic;  Laterality: N/A;    REVIEW OF SYSTEMS:  A comprehensive review of systems was negative except for: Constitutional: positive for fatigue Respiratory: positive for dyspnea on exertion   PHYSICAL EXAMINATION:  General appearance: alert, cooperative, fatigued, and no distress Head: Normocephalic, without obvious abnormality, atraumatic Neck: no adenopathy, no JVD, supple, symmetrical, trachea midline, and thyroid not enlarged, symmetric, no tenderness/mass/nodules Lymph nodes: Cervical, supraclavicular, and axillary nodes normal. Resp: clear to auscultation bilaterally Back: symmetric, no curvature. ROM normal. No CVA tenderness. Cardio: regular rate and rhythm, S1, S2 normal, no murmur, click, rub or gallop GI: soft, non-tender; bowel sounds normal; no masses,  no organomegaly Extremities: extremities normal, atraumatic, no cyanosis or edema  ECOG PERFORMANCE STATUS: 1 - Symptomatic but completely ambulatory  Blood pressure 111/76, pulse (!) 107, temperature (!) 97.4 F (36.3 C), temperature source Tympanic, resp. rate 20, height 5\' 6"  (1.676 m), weight 142 lb 1.6 oz (64.5 kg), SpO2 97 %.  LABORATORY DATA: Lab Results  Component Value Date    WBC 4.7 02/27/2021   HGB 14.4 02/27/2021   HCT 43.0 02/27/2021   MCV 84.6 02/27/2021   PLT 217 02/27/2021      Chemistry      Component Value Date/Time   NA 143 02/27/2021 0946   NA 144 04/09/2017 0948   K 4.3 02/27/2021 0946   K 4.3 04/09/2017 0948   CL 104 02/27/2021 0946   CO2 28 02/27/2021 0946   CO2 25 04/09/2017 0948   BUN 18 02/27/2021 0946   BUN 16.8 04/09/2017 0948   CREATININE 0.73 02/27/2021 0946   CREATININE 0.8 04/09/2017 0948      Component Value Date/Time   CALCIUM 9.5 02/27/2021 0946   CALCIUM 9.1 04/09/2017 0948   ALKPHOS 286 (H) 02/27/2021 0946   ALKPHOS 216 (H) 04/09/2017 0948   AST 22 02/27/2021 0946   AST 12 04/09/2017 0948   ALT 11 02/27/2021 0946   ALT 13 04/09/2017 0948   BILITOT <0.2 (L) 02/27/2021 0946   BILITOT 0.22 04/09/2017 0948       RADIOGRAPHIC STUDIES:-   No results found.   ASSESSMENT AND PLAN:  This is a very pleasant 70 years old African-American female with a recurrent non-small cell lung cancer initially diagnosed as stage IIIA non-small cell lung cancer status post concurrent chemoradiation followed by consolidation chemotherapy with carboplatin and paclitaxel for 3 cycles. The patient was followed by observation but restaging imaging studies showed evidence for disease progression. She was started on second line treatment with immunotherapy with Nivolumab 480 mg IV every 4 weeks status post 32 cycles.   The patient has been in observation since August 2021 with no concerning complaints except for the baseline shortness of breath and intermittent headache. The patient resumed her treatment with cycle number cycle #33 of single agent nivolumab 480 mg IV every 4 weeks in March 2022 status post 40 cycles. The patient continues to tolerate her treatment well with no concerning adverse effects. I recommended for her to proceed with cycle #41 today. She will come back for follow-up visit in 4 weeks for evaluation before starting  cycle #42 with repeat CT scan of the chest, abdomen pelvis for restaging of her disease. For the COPD, she will continue with home oxygen for now. The patient was advised to call immediately if she has any concerning symptoms in the interval.  All questions were answered. The patient knows to call the clinic with any problems, questions or concerns. We can certainly see the patient much sooner if necessary.  Disclaimer: This note was dictated with voice recognition software. Similar sounding words can inadvertently be transcribed and may not be corrected upon review.

## 2021-02-27 NOTE — Progress Notes (Signed)
OK to trt w/ elevated HR per MD

## 2021-02-27 NOTE — Patient Instructions (Signed)
[facility]  Discharge Instructions: ?Thank you for choosing [facility] to provide your oncology and hematology care.  ? ?If you have a lab appointment with the Cancer Center, please go directly to the Cancer Center and check in at the registration area. ?  ?Wear comfortable clothing and clothing appropriate for easy access to any Portacath or PICC line.  ? ?We strive to give you quality time with your provider. You may need to reschedule your appointment if you arrive late (15 or more minutes).  Arriving late affects you and other patients whose appointments are after yours.  Also, if you miss three or more appointments without notifying the office, you may be dismissed from the clinic at the provider?s discretion.    ?  ?For prescription refill requests, have your pharmacy contact our office and allow 72 hours for refills to be completed.   ? ?Today you received the following chemotherapy and/or immunotherapy agents Opdivo    ?  ?To help prevent nausea and vomiting after your treatment, we encourage you to take your nausea medication as directed. ? ?BELOW ARE SYMPTOMS THAT SHOULD BE REPORTED IMMEDIATELY: ?*FEVER GREATER THAN 100.4 F (38 ?C) OR HIGHER ?*CHILLS OR SWEATING ?*NAUSEA AND VOMITING THAT IS NOT CONTROLLED WITH YOUR NAUSEA MEDICATION ?*UNUSUAL SHORTNESS OF BREATH ?*UNUSUAL BRUISING OR BLEEDING ?*URINARY PROBLEMS (pain or burning when urinating, or frequent urination) ?*BOWEL PROBLEMS (unusual diarrhea, constipation, pain near the anus) ?TENDERNESS IN MOUTH AND THROAT WITH OR WITHOUT PRESENCE OF ULCERS (sore throat, sores in mouth, or a toothache) ?UNUSUAL RASH, SWELLING OR PAIN  ?UNUSUAL VAGINAL DISCHARGE OR ITCHING  ? ?Items with * indicate a potential emergency and should be followed up as soon as possible or go to the Emergency Department if any problems should occur. ? ?Please show the CHEMOTHERAPY ALERT CARD or IMMUNOTHERAPY ALERT CARD at check-in to the  Emergency Department and triage nurse. ? ?Should you have questions after your visit or need to cancel or reschedule your appointment, please contact [facility]  Dept: [phone]  and follow the prompts.  Office hours are 8:00 a.m. to 4:30 p.m. Monday - Friday. Please note that voicemails left after 4:00 p.m. may not be returned until the following business day.  We are closed weekends and major holidays. You have access to a nurse at all times for urgent questions. Please call the main number to the clinic Dept: [phone] and follow the prompts. ? ? ?For any non-urgent questions, you may also contact your provider using MyChart. We now offer e-Visits for anyone 18 and older to request care online for non-urgent symptoms. For details visit [email or web address]. ?  ?Also download the MyChart app! Go to the app store, search "MyChart", open the app, select [facility], and log in with your MyChart username and password. ? ?Due to Covid, a mask is required upon entering the hospital/clinic. If you do not have a mask, one will be given to you upon arrival. For doctor visits, patients may have 1 support person aged 18 or older with them. For treatment visits, patients cannot have anyone with them due to current Covid guidelines and our immunocompromised population.  ? ?

## 2021-03-25 ENCOUNTER — Ambulatory Visit (HOSPITAL_COMMUNITY)
Admission: RE | Admit: 2021-03-25 | Discharge: 2021-03-25 | Disposition: A | Discharge status: Home / Self Care | Payer: Medicare HMO | Source: Ambulatory Visit | Attending: Internal Medicine | Admitting: Internal Medicine

## 2021-03-25 DIAGNOSIS — C3490 Malignant neoplasm of unspecified part of unspecified bronchus or lung: Secondary | ICD-10-CM | POA: Diagnosis not present

## 2021-03-25 MED ORDER — IOHEXOL 350 MG/ML SOLN
75.0000 mL | Freq: Once | INTRAVENOUS | Status: AC | PRN
  Administered 2021-03-25: 75 mL via INTRAVENOUS

## 2021-03-25 MED ORDER — SODIUM CHLORIDE (PF) 0.9 % IJ SOLN
INTRAMUSCULAR | Status: AC
  Filled 2021-03-25: qty 50

## 2021-03-27 ENCOUNTER — Other Ambulatory Visit: Payer: Self-pay | Admitting: Internal Medicine

## 2021-03-27 ENCOUNTER — Inpatient Hospital Stay: Payer: Medicare HMO | Attending: Physician Assistant

## 2021-03-27 ENCOUNTER — Inpatient Hospital Stay: Payer: Medicare HMO

## 2021-03-27 ENCOUNTER — Inpatient Hospital Stay (HOSPITAL_BASED_OUTPATIENT_CLINIC_OR_DEPARTMENT_OTHER): Payer: Medicare HMO | Admitting: Internal Medicine

## 2021-03-27 ENCOUNTER — Encounter: Payer: Self-pay | Admitting: Internal Medicine

## 2021-03-27 ENCOUNTER — Other Ambulatory Visit: Payer: Self-pay

## 2021-03-27 VITALS — HR 105

## 2021-03-27 VITALS — BP 154/83 | HR 106 | Temp 96.2°F | Resp 20 | Ht 66.0 in | Wt 143.6 lb

## 2021-03-27 DIAGNOSIS — Z79899 Other long term (current) drug therapy: Secondary | ICD-10-CM | POA: Diagnosis not present

## 2021-03-27 DIAGNOSIS — C3491 Malignant neoplasm of unspecified part of right bronchus or lung: Secondary | ICD-10-CM

## 2021-03-27 DIAGNOSIS — Z5112 Encounter for antineoplastic immunotherapy: Secondary | ICD-10-CM

## 2021-03-27 DIAGNOSIS — C342 Malignant neoplasm of middle lobe, bronchus or lung: Secondary | ICD-10-CM | POA: Insufficient documentation

## 2021-03-27 LAB — CBC WITH DIFFERENTIAL (CANCER CENTER ONLY)
Abs Immature Granulocytes: 0.01 10*3/uL (ref 0.00–0.07)
Basophils Absolute: 0 10*3/uL (ref 0.0–0.1)
Basophils Relative: 1 %
Eosinophils Absolute: 0.1 10*3/uL (ref 0.0–0.5)
Eosinophils Relative: 1 %
HCT: 39.2 % (ref 36.0–46.0)
Hemoglobin: 13.7 g/dL (ref 12.0–15.0)
Immature Granulocytes: 0 %
Lymphocytes Relative: 27 %
Lymphs Abs: 1.2 10*3/uL (ref 0.7–4.0)
MCH: 29.5 pg (ref 26.0–34.0)
MCHC: 34.9 g/dL (ref 30.0–36.0)
MCV: 84.3 fL (ref 80.0–100.0)
Monocytes Absolute: 0.4 10*3/uL (ref 0.1–1.0)
Monocytes Relative: 9 %
Neutro Abs: 2.7 10*3/uL (ref 1.7–7.7)
Neutrophils Relative %: 62 %
Platelet Count: 233 10*3/uL (ref 150–400)
RBC: 4.65 MIL/uL (ref 3.87–5.11)
RDW: 14.8 % (ref 11.5–15.5)
WBC Count: 4.5 10*3/uL (ref 4.0–10.5)
nRBC: 0 % (ref 0.0–0.2)

## 2021-03-27 LAB — CMP (CANCER CENTER ONLY)
ALT: 9 U/L (ref 0–44)
AST: 22 U/L (ref 15–41)
Albumin: 3.7 g/dL (ref 3.5–5.0)
Alkaline Phosphatase: 260 U/L — ABNORMAL HIGH (ref 38–126)
Anion gap: 10 (ref 5–15)
BUN: 12 mg/dL (ref 8–23)
CO2: 29 mmol/L (ref 22–32)
Calcium: 9.5 mg/dL (ref 8.9–10.3)
Chloride: 106 mmol/L (ref 98–111)
Creatinine: 0.67 mg/dL (ref 0.44–1.00)
GFR, Estimated: >60 mL/min (ref >60)
Glucose, Bld: 72 mg/dL (ref 70–99)
Potassium: 4.1 mmol/L (ref 3.5–5.1)
Sodium: 145 mmol/L (ref 135–145)
Total Bilirubin: 0.2 mg/dL — ABNORMAL LOW (ref 0.3–1.2)
Total Protein: 7 g/dL (ref 6.5–8.1)

## 2021-03-27 LAB — TSH: TSH: 2.793 u[IU]/mL (ref 0.308–3.960)

## 2021-03-27 MED ORDER — PROCHLORPERAZINE MALEATE 10 MG PO TABS: 10.0000 mg | ORAL_TABLET | Freq: Four times a day (QID) | ORAL | 0 refills | Status: DC | PRN

## 2021-03-27 MED ORDER — SODIUM CHLORIDE 0.9 % IV SOLN
480.0000 mg | Freq: Once | INTRAVENOUS | Status: AC
  Administered 2021-03-27: 480 mg via INTRAVENOUS
  Filled 2021-03-27: qty 48

## 2021-03-27 MED ORDER — SODIUM CHLORIDE 0.9 % IV SOLN: Freq: Once | INTRAVENOUS | Status: AC

## 2021-03-27 NOTE — Progress Notes (Signed)
Okay to treat with elevated pulse per Dr. Julien Nordmann

## 2021-03-27 NOTE — Patient Instructions (Signed)
[facility]  Discharge Instructions: ?Thank you for choosing [facility] to provide your oncology and hematology care.  ? ?If you have a lab appointment with the [facility], please go directly to the [facility] and check in at the registration area. ?  ?Wear comfortable clothing and clothing appropriate for easy access to any Portacath or PICC line.  ? ?We strive to give you quality time with your provider. You may need to reschedule your appointment if you arrive late (15 or more minutes).  Arriving late affects you and other patients whose appointments are after yours.  Also, if you miss three or more appointments without notifying the office, you may be dismissed from the clinic at the provider?s discretion.    ?  ?For prescription refill requests, have your pharmacy contact our office and allow 72 hours for refills to be completed.   ? ?Today you received the following chemotherapy and/or immunotherapy agents Opdivo    ?  ?To help prevent nausea and vomiting after your treatment, we encourage you to take your nausea medication as directed. ? ?BELOW ARE SYMPTOMS THAT SHOULD BE REPORTED IMMEDIATELY: ?*FEVER GREATER THAN 100.4 F (38 ?C) OR HIGHER ?*CHILLS OR SWEATING ?*NAUSEA AND VOMITING THAT IS NOT CONTROLLED WITH YOUR NAUSEA MEDICATION ?*UNUSUAL SHORTNESS OF BREATH ?*UNUSUAL BRUISING OR BLEEDING ?*URINARY PROBLEMS (pain or burning when urinating, or frequent urination) ?*BOWEL PROBLEMS (unusual diarrhea, constipation, pain near the anus) ?TENDERNESS IN MOUTH AND THROAT WITH OR WITHOUT PRESENCE OF ULCERS (sore throat, sores in mouth, or a toothache) ?UNUSUAL RASH, SWELLING OR PAIN  ?UNUSUAL VAGINAL DISCHARGE OR ITCHING  ? ?Items with * indicate a potential emergency and should be followed up as soon as possible or go to the Emergency Department if any problems should occur. ? ?Please show the CHEMOTHERAPY ALERT CARD or IMMUNOTHERAPY ALERT CARD at check-in to the  Emergency Department and triage nurse. ? ?Should you have questions after your visit or need to cancel or reschedule your appointment, please contact [facility]  Dept: [phone]  and follow the prompts.  Office hours are 8:00 a.m. to 4:30 p.m. Monday - Friday. Please note that voicemails left after 4:00 p.m. may not be returned until the following business day.  We are closed weekends and major holidays. You have access to a nurse at all times for urgent questions. Please call the main number to the clinic Dept: [phone] and follow the prompts. ? ? ?For any non-urgent questions, you may also contact your provider using MyChart. We now offer e-Visits for anyone 18 and older to request care online for non-urgent symptoms. For details visit [email or web address]. ?  ?Also download the MyChart app! Go to the app store, search "MyChart", open the app, select [facility], and log in with your MyChart username and password. ? ?Due to Covid, a mask is required upon entering the hospital/clinic. If you do not have a mask, one will be given to you upon arrival. For doctor visits, patients may have 1 support person aged 18 or older with them. For treatment visits, patients cannot have anyone with them due to current Covid guidelines and our immunocompromised population.  ? ?

## 2021-03-27 NOTE — Progress Notes (Signed)
[facility] [phone]   [phone]  OFFICE PROGRESS NOTE  [name], [place] [address]  DIAGNOSIS: Recurrent non-small cell lung cancer initially diagnosed as stage IIIA (T1b, N2, M0) non-small cell lung cancer presented with right middle lobe pulmonary nodule, mediastinal lymphadenopathy and highly suspicious for small nodule in the left upper lobe that could change her stage to stage IV that could present another synchronous primary lesion in the left upper lobe. This was diagnosed in September 2017.  PRIOR THERAPY:  1) Concurrent chemoradiation with weekly carboplatin for AUC of 2 and paclitaxel 45 MG/M2 status post 6 cycles last dose was given 02/25/2016 with partial response. 2) Consolidation chemotherapy with carboplatin for AUC of 5 and paclitaxel 175 MG/M2 every 3 weeks with Neulasta support. First dose 04/22/2016. Status post 3 cycles. 3) Second line treatment with immunotherapy with Nivolumab 480 mg IV every 4 weeks status post 32 cycles.  Discontinued secondary to intolerance and frequent hospitalization with pneumonia and pneumonitis.  She had a break of treatment between November 24, 2019 until June 28, 2020.  CURRENT THERAPY: Resuming her treatment with immunotherapy with nivolumab 480 mg IV every 4 weeks.  Cycle #33 started on June 28, 2020.  Status post 41 cycles.  INTERVAL HISTORY: Courtney Zuniga 70 y.o. female returns to the clinic today for follow-up visit.  The patient is feeling fine today with no concerning complaints except for twitching of her eyes.  She also has intermittent headache and she had repeat MRI of the brain several times in the past for the same complaints and it was negative for malignancy.  She is scheduled to see an ophthalmologist in the next 2 weeks.  She denied having any current chest pain but has baseline shortness of breath secondary to COPD and she is currently on home oxygen.  She  has no nausea, vomiting, diarrhea or constipation.  She has no weight loss or night sweats.  She had repeat CT scan of the chest, abdomen pelvis performed recently and she is here for evaluation and discussion of her scan results.   MEDICAL HISTORY: Past Medical History:  Diagnosis Date   Anemia    as a young woman   Arthritis    osteoartritis   Asthma    Brain tumor (benign) ([facility]) 2005 [facility]   Benign   Chronic headaches    Chronic hip pain    Chronic pain    COPD (chronic obstructive pulmonary disease) (HCC)    Coronary artery disease    Depression    Depression 05/15/2016   Encounter for antineoplastic chemotherapy 01/10/2016   GERD (gastroesophageal reflux disease)    Hypertension    Lung cancer ([facility]) dx'd 01/2016   currently on chemo and radiation    NSTEMI (non-ST elevated myocardial infarction) ([facility]) yrs ago   On home O2    qhs 2 liters at hs and prn   Pneumonia last time 2 yrs ago   Shortness of breath dyspnea    with activity    ALLERGIES:  is allergic to no known allergies.  MEDICATIONS:  Current Outpatient Medications  Medication Sig Dispense Refill   acetaminophen (TYLENOL) 325 MG tablet Take 2 tablets (650 mg total) by mouth every 6 (six) hours as needed for mild pain, fever or headache. 12 tablet 2   albuterol (PROVENTIL HFA;VENTOLIN HFA) 108 (90 BASE) MCG/ACT inhaler Inhale 2 puffs into the lungs every 4 (four) hours as needed  for shortness of breath.      atorvastatin (LIPITOR) 10 MG tablet Take 10 mg by mouth daily.     Cholecalciferol (VITAMIN D3) 125 MCG (5000 UT) CAPS Take 1 capsule by mouth daily.  0   cyclobenzaprine (FLEXERIL) 10 MG tablet Take 1 tablet (10 mg total) by mouth 2 (two) times daily. *MAy take one additional tablet as needed for muscle spasms (Patient taking differently: Take 10 mg by mouth 2 (two) times daily. *May take one additional tablet as needed for muscle spasms) 60 tablet 2   dextromethorphan-guaiFENesin (MUCINEX DM) 30-600 MG 12hr  tablet Take 1 tablet by mouth 2 (two) times daily. 40 tablet 0   diclofenac sodium (VOLTAREN) 1 % GEL Apply topically 4 (four) times daily as needed (massge gel into affected area(s) as needed for pain).   1   DULoxetine (CYMBALTA) 60 MG capsule Cymbalta 60 mg capsule,delayed release  Take 1 capsule every day by oral route.  stop 30 mg capsules     Dupilumab (DUPIXENT) 300 MG/2ML SOPN Inject 300 mg into the skin every 14 (fourteen) days. Twice a month     ENSURE (ENSURE) Take 237 mLs by mouth 3 (three) times daily between meals.     HYDROcodone bit-homatropine (HYCODAN) 5-1.5 MG/5ML syrup Take 5 mLs by mouth every 6 (six) hours as needed for cough. 120 mL 0   Ipratropium-Albuterol (COMBIVENT RESPIMAT) 20-100 MCG/ACT AERS respimat INHALE 2 PUFFS BY MOUTH FOUR TIMES DAILY AS NEEDED FOR WHEEZING     Ipratropium-Albuterol (COMBIVENT) 20-100 MCG/ACT AERS respimat Inhale 2 puffs into the lungs 4 (four) times daily as needed.     ipratropium-albuterol (DUONEB) 0.5-2.5 (3) MG/3ML SOLN Take 3 mLs by nebulization 3 (three) times daily. 360 mL 2   metoprolol tartrate (LOPRESSOR) 25 MG tablet Take 1 tablet (25 mg total) by mouth 2 (two) times daily. 60 tablet 0   oxyCODONE-acetaminophen (PERCOCET/ROXICET) 5-325 MG tablet Take 1 tablet by mouth every 8 (eight) hours as needed for moderate pain. 12 tablet 0   topiramate (TOPAMAX) 25 MG tablet Take 1 tablet by mouth daily at bedtime X 1 week; then increase to 25mg  BID. (Patient taking differently: Take 25 mg by mouth 2 (two) times daily.) 45 tablet 2   No current facility-administered medications for this visit.    SURGICAL HISTORY:  Past Surgical History:  Procedure Laterality Date   CHOLECYSTECTOMY     COLONOSCOPY  2015   Results requested from [facility]   COLONOSCOPY     ESOPHAGOGASTRODUODENOSCOPY N/A 08/14/2015   Procedure: ESOPHAGOGASTRODUODENOSCOPY (EGD);  Surgeon: Daneil Dolin, MD;  [facility];  Service: Endoscopy;   Laterality: N/A;  215    ESOPHAGOGASTRODUODENOSCOPY (EGD) WITH PROPOFOL N/A 09/13/2015   Procedure: ESOPHAGOGASTRODUODENOSCOPY (EGD) WITH PROPOFOL;  Surgeon: Milus Banister, MD;  Location: [facility];  Service: Endoscopy;  Laterality: N/A;   EUS N/A 03/12/2017   Procedure: UPPER ENDOSCOPIC ULTRASOUND (EUS) RADIAL;  Surgeon: Milus Banister, MD;  Location: [facility];  Service: Endoscopy;  Laterality: N/A;   TUMOR REMOVAL  2005   Benign   UPPER ESOPHAGEAL ENDOSCOPIC ULTRASOUND (EUS)  09/13/2015   Procedure: UPPER ESOPHAGEAL ENDOSCOPIC ULTRASOUND (EUS);  Surgeon: Milus Banister, MD;  Location: [facility];  Service: Endoscopy;;   VIDEO BRONCHOSCOPY WITH ENDOBRONCHIAL NAVIGATION N/A 12/31/2015   Procedure: VIDEO BRONCHOSCOPY WITH ENDOBRONCHIAL NAVIGATION;  Surgeon: Melrose Nakayama, MD;  Location: [facility];  Service: Thoracic;  Laterality: N/A;   VIDEO BRONCHOSCOPY WITH ENDOBRONCHIAL ULTRASOUND  N/A 11/08/2015   Procedure: VIDEO BRONCHOSCOPY WITH ENDOBRONCHIAL ULTRASOUND;  Surgeon: Ivin Poot, MD;  Location: [facility] OR;  Service: Thoracic;  Laterality: N/A;    REVIEW OF SYSTEMS:  Constitutional: positive for fatigue Eyes: positive for visual disturbance Ears, nose, mouth, throat, and face: negative Respiratory: positive for dyspnea on exertion Cardiovascular: negative Gastrointestinal: negative Genitourinary:negative Integument/breast: negative Hematologic/lymphatic: negative Musculoskeletal:negative Neurological: negative Behavioral/Psych: negative Endocrine: negative Allergic/Immunologic: negative   PHYSICAL EXAMINATION: General appearance: alert, cooperative, fatigued, and no distress Head: Normocephalic, without obvious abnormality, atraumatic Neck: no adenopathy, no JVD, supple, symmetrical, trachea midline, and thyroid not enlarged, symmetric, no tenderness/mass/nodules Lymph nodes: Cervical, supraclavicular, and axillary nodes normal. Resp: clear to auscultation  bilaterally Back: symmetric, no curvature. ROM normal. No CVA tenderness. Cardio: regular rate and rhythm, S1, S2 normal, no murmur, click, rub or gallop GI: soft, non-tender; bowel sounds normal; no masses,  no organomegaly Extremities: extremities normal, atraumatic, no cyanosis or edema Neurologic: Alert and oriented X 3, normal strength and tone. Normal symmetric reflexes. Normal coordination and gait  ECOG PERFORMANCE STATUS: 1 - Symptomatic but completely ambulatory  Blood pressure (!) 154/83, pulse (!) 106, temperature (!) 96.2 F (35.7 C), temperature source Tympanic, resp. rate 20, height 5\' 6"  (1.676 m), weight 143 lb 9.6 oz (65.1 kg), SpO2 94 %.  LABORATORY DATA: Lab Results  Component Value Date   WBC 4.5 03/27/2021   HGB 13.7 03/27/2021   HCT 39.2 03/27/2021   MCV 84.3 03/27/2021   PLT 233 03/27/2021      Chemistry      Component Value Date/Time   NA 143 02/27/2021 0946   NA 144 04/09/2017 0948   K 4.3 02/27/2021 0946   K 4.3 04/09/2017 0948   CL 104 02/27/2021 0946   CO2 28 02/27/2021 0946   CO2 25 04/09/2017 0948   BUN 18 02/27/2021 0946   BUN 16.8 04/09/2017 0948   CREATININE 0.73 02/27/2021 0946   CREATININE 0.8 04/09/2017 0948      Component Value Date/Time   CALCIUM 9.5 02/27/2021 0946   CALCIUM 9.1 04/09/2017 0948   ALKPHOS 286 (H) 02/27/2021 0946   ALKPHOS 216 (H) 04/09/2017 0948   AST 22 02/27/2021 0946   AST 12 04/09/2017 0948   ALT 11 02/27/2021 0946   ALT 13 04/09/2017 0948   BILITOT <0.2 (L) 02/27/2021 0946   BILITOT 0.22 04/09/2017 0948       RADIOGRAPHIC STUDIES:-   CT Chest W Contrast  Result Date: 03/26/2021 CLINICAL DATA:  Restaging non-small cell lung cancer. EXAM: CT CHEST, ABDOMEN, AND PELVIS WITH CONTRAST TECHNIQUE: Multidetector CT imaging of the chest, abdomen and pelvis was performed following the standard protocol during bolus administration of intravenous contrast. CONTRAST:  58mL OMNIPAQUE IOHEXOL 350 MG/ML SOLN  COMPARISON:  PET-CT 12/28/2020, CT chest, abdomen and pelvis 11/12/2020 FINDINGS: CT CHEST FINDINGS Cardiovascular: Normal heart size. No pericardial effusion. Aortic and coronary artery atherosclerotic calcifications. Mediastinum/Nodes: No enlarged mediastinal, hilar, or axillary lymph nodes. Thyroid gland, trachea, and esophagus demonstrate no significant findings. Lungs/Pleura: Moderate to severe centrilobular and paraseptal emphysema. No pleural effusion, airspace consolidation or atelectasis. Masslike architectural distortion and fibrosis within the right perihilar region is again noted reflecting changes secondary to external beam radiation. This appears unchanged from 11/12/2020. The right upper lobe lung mass measures 2.8 by 1.7 by 3.8 cm, image 31/4 and image 77/5. This is compared with 3.1 x 1.7 by 4.1 cm previously (when remeasured). No new pulmonary nodule or mass identified. Musculoskeletal: No  chest wall mass or suspicious bone lesions identified. CT ABDOMEN PELVIS FINDINGS Hepatobiliary: No focal liver abnormality. Previous cholecystectomy. Fusiform dilatation of the common bile duct appears unchanged measuring up to 1 cm. No signs of choledocholithiasis or obstructing mass. Pancreas: Unremarkable. No pancreatic ductal dilatation or surrounding inflammatory changes. Spleen: Normal in size without focal abnormality. Adrenals/Urinary Tract: Adrenal glands are unremarkable. Small bilateral kidney cysts. No solid enhancing mass, nephrolithiasis or hydronephrosis. The urinary bladder is unremarkable. Bladder is unremarkable. Stomach/Bowel: Stomach is within normal limits. Appendix appears normal. No evidence of bowel wall thickening, distention, or inflammatory changes. Sigmoid diverticulosis without signs of acute diverticulitis. Vascular/Lymphatic: Aortic atherosclerosis. No aneurysm. No abdominopelvic adenopathy. Reproductive: Partially calcified fibroid uterus. No adnexal mass identified. Other: No  abdominal wall hernia or abnormality. No abdominopelvic ascites. Musculoskeletal: No acute or significant osseous findings. Osteopenia. IMPRESSION: 1. No significant change in size of right upper lobe lung mass. No new or progressive disease identified. 2. Aortic Atherosclerosis (ICD10-I70.0) and Emphysema (ICD10-J43.9). 3. Coronary artery calcifications. Electronically Signed   By: Kerby Moors M.D.   On: 03/26/2021 06:02   CT Abdomen Pelvis W Contrast  Result Date: 03/26/2021 CLINICAL DATA:  Restaging non-small cell lung cancer. EXAM: CT CHEST, ABDOMEN, AND PELVIS WITH CONTRAST TECHNIQUE: Multidetector CT imaging of the chest, abdomen and pelvis was performed following the standard protocol during bolus administration of intravenous contrast. CONTRAST:  57mL OMNIPAQUE IOHEXOL 350 MG/ML SOLN COMPARISON:  PET-CT 12/28/2020, CT chest, abdomen and pelvis 11/12/2020 FINDINGS: CT CHEST FINDINGS Cardiovascular: Normal heart size. No pericardial effusion. Aortic and coronary artery atherosclerotic calcifications. Mediastinum/Nodes: No enlarged mediastinal, hilar, or axillary lymph nodes. Thyroid gland, trachea, and esophagus demonstrate no significant findings. Lungs/Pleura: Moderate to severe centrilobular and paraseptal emphysema. No pleural effusion, airspace consolidation or atelectasis. Masslike architectural distortion and fibrosis within the right perihilar region is again noted reflecting changes secondary to external beam radiation. This appears unchanged from 11/12/2020. The right upper lobe lung mass measures 2.8 by 1.7 by 3.8 cm, image 31/4 and image 77/5. This is compared with 3.1 x 1.7 by 4.1 cm previously (when remeasured). No new pulmonary nodule or mass identified. Musculoskeletal: No chest wall mass or suspicious bone lesions identified. CT ABDOMEN PELVIS FINDINGS Hepatobiliary: No focal liver abnormality. Previous cholecystectomy. Fusiform dilatation of the common bile duct appears unchanged  measuring up to 1 cm. No signs of choledocholithiasis or obstructing mass. Pancreas: Unremarkable. No pancreatic ductal dilatation or surrounding inflammatory changes. Spleen: Normal in size without focal abnormality. Adrenals/Urinary Tract: Adrenal glands are unremarkable. Small bilateral kidney cysts. No solid enhancing mass, nephrolithiasis or hydronephrosis. The urinary bladder is unremarkable. Bladder is unremarkable. Stomach/Bowel: Stomach is within normal limits. Appendix appears normal. No evidence of bowel wall thickening, distention, or inflammatory changes. Sigmoid diverticulosis without signs of acute diverticulitis. Vascular/Lymphatic: Aortic atherosclerosis. No aneurysm. No abdominopelvic adenopathy. Reproductive: Partially calcified fibroid uterus. No adnexal mass identified. Other: No abdominal wall hernia or abnormality. No abdominopelvic ascites. Musculoskeletal: No acute or significant osseous findings. Osteopenia. IMPRESSION: 1. No significant change in size of right upper lobe lung mass. No new or progressive disease identified. 2. Aortic Atherosclerosis (ICD10-I70.0) and Emphysema (ICD10-J43.9). 3. Coronary artery calcifications. Electronically Signed   By: Kerby Moors M.D.   On: 03/26/2021 06:02     ASSESSMENT AND PLAN:  This is a very pleasant 70 years old African-American female with a recurrent non-small cell lung cancer initially diagnosed as stage IIIA non-small cell lung cancer status post concurrent chemoradiation followed by consolidation chemotherapy  with carboplatin and paclitaxel for 3 cycles. The patient was followed by observation but restaging imaging studies showed evidence for disease progression. She was started on second line treatment with immunotherapy with Nivolumab 480 mg IV every 4 weeks status post 32 cycles.   The patient has been in observation since August 2021 with no concerning complaints except for the baseline shortness of breath and intermittent  headache. The patient resumed her treatment with cycle number cycle #33 of single agent nivolumab 480 mg IV every 4 weeks in March 2022 status post 41 cycles. The patient has been tolerating her treatment with nivolumab fairly well. She had repeat CT scan of the chest, abdomen pelvis performed recently.  I personally and independently reviewed the scans and discussed the results with the patient today. Her scan showed no significant change in the size of the right upper lobe lung mass and no new or progressive disease. I recommended for her to continue her current treatment with nivolumab and she will proceed with cycle #42 today. Further visual changes, she will see her ophthalmologist for evaluation and management. She had several MRIs of the brain in the past that were unremarkable. The patient will come back for follow-up visit in 4 weeks for evaluation before the next cycle of her treatment. She was advised to call immediately if she has any other concerning symptoms in the interval.  All questions were answered. The patient knows to call the clinic with any problems, questions or concerns. We can certainly see the patient much sooner if necessary.  Disclaimer: This note was dictated with voice recognition software. Similar sounding words can inadvertently be transcribed and may not be corrected upon review.

## 2021-04-17 ENCOUNTER — Encounter: Payer: Self-pay | Admitting: Internal Medicine

## 2021-04-24 ENCOUNTER — Encounter: Payer: Self-pay | Admitting: Internal Medicine

## 2021-04-24 ENCOUNTER — Inpatient Hospital Stay: Payer: Medicare Other | Attending: Physician Assistant

## 2021-04-24 ENCOUNTER — Other Ambulatory Visit: Payer: Self-pay

## 2021-04-24 ENCOUNTER — Inpatient Hospital Stay: Payer: Medicare Other | Admitting: Internal Medicine

## 2021-04-24 ENCOUNTER — Inpatient Hospital Stay: Payer: Medicare Other

## 2021-04-24 ENCOUNTER — Encounter: Payer: Self-pay | Admitting: *Deleted

## 2021-04-24 VITALS — HR 114

## 2021-04-24 DIAGNOSIS — C342 Malignant neoplasm of middle lobe, bronchus or lung: Secondary | ICD-10-CM | POA: Insufficient documentation

## 2021-04-24 DIAGNOSIS — C3491 Malignant neoplasm of unspecified part of right bronchus or lung: Secondary | ICD-10-CM

## 2021-04-24 DIAGNOSIS — Z5112 Encounter for antineoplastic immunotherapy: Secondary | ICD-10-CM | POA: Insufficient documentation

## 2021-04-24 DIAGNOSIS — C3490 Malignant neoplasm of unspecified part of unspecified bronchus or lung: Secondary | ICD-10-CM | POA: Diagnosis not present

## 2021-04-24 DIAGNOSIS — Z79899 Other long term (current) drug therapy: Secondary | ICD-10-CM | POA: Insufficient documentation

## 2021-04-24 LAB — CMP (CANCER CENTER ONLY)
ALT: 9 U/L (ref 0–44)
AST: 21 U/L (ref 15–41)
Albumin: 4.2 g/dL (ref 3.5–5.0)
Alkaline Phosphatase: 248 U/L — ABNORMAL HIGH (ref 38–126)
Anion gap: 7 (ref 5–15)
BUN: 17 mg/dL (ref 8–23)
CO2: 31 mmol/L (ref 22–32)
Calcium: 9.6 mg/dL (ref 8.9–10.3)
Chloride: 104 mmol/L (ref 98–111)
Creatinine: 0.68 mg/dL (ref 0.44–1.00)
GFR, Estimated: >60 mL/min (ref >60)
Glucose, Bld: 75 mg/dL (ref 70–99)
Potassium: 4.2 mmol/L (ref 3.5–5.1)
Sodium: 142 mmol/L (ref 135–145)
Total Bilirubin: 0.3 mg/dL (ref 0.3–1.2)
Total Protein: 7 g/dL (ref 6.5–8.1)

## 2021-04-24 LAB — CBC WITH DIFFERENTIAL (CANCER CENTER ONLY)
Abs Immature Granulocytes: 0.01 10*3/uL (ref 0.00–0.07)
Basophils Absolute: 0 10*3/uL (ref 0.0–0.1)
Basophils Relative: 0 %
Eosinophils Absolute: 0.1 10*3/uL (ref 0.0–0.5)
Eosinophils Relative: 2 %
HCT: 40.4 % (ref 36.0–46.0)
Hemoglobin: 13.9 g/dL (ref 12.0–15.0)
Immature Granulocytes: 0 %
Lymphocytes Relative: 27 %
Lymphs Abs: 1.3 10*3/uL (ref 0.7–4.0)
MCH: 29.4 pg (ref 26.0–34.0)
MCHC: 34.4 g/dL (ref 30.0–36.0)
MCV: 85.6 fL (ref 80.0–100.0)
Monocytes Absolute: 0.4 10*3/uL (ref 0.1–1.0)
Monocytes Relative: 9 %
Neutro Abs: 3 10*3/uL (ref 1.7–7.7)
Neutrophils Relative %: 62 %
Platelet Count: 158 10*3/uL (ref 150–400)
RBC: 4.72 MIL/uL (ref 3.87–5.11)
RDW: 14.5 % (ref 11.5–15.5)
WBC Count: 4.8 10*3/uL (ref 4.0–10.5)
nRBC: 0 % (ref 0.0–0.2)

## 2021-04-24 LAB — TSH: TSH: 2.853 u[IU]/mL (ref 0.308–3.960)

## 2021-04-24 MED ORDER — HYDROCODONE BIT-HOMATROP MBR 5-1.5 MG/5ML PO SOLN: 5.0000 mL | Freq: Four times a day (QID) | ORAL | 0 refills | Status: DC | PRN

## 2021-04-24 MED ORDER — SODIUM CHLORIDE 0.9 % IV SOLN: Freq: Once | INTRAVENOUS | Status: AC

## 2021-04-24 MED ORDER — SODIUM CHLORIDE 0.9 % IV SOLN
480.0000 mg | Freq: Once | INTRAVENOUS | Status: AC
  Administered 2021-04-24: 480 mg via INTRAVENOUS
  Filled 2021-04-24: qty 48

## 2021-04-24 NOTE — Patient Instructions (Signed)
[facility]   Discharge Instructions: Thank you for choosing [facility] to provide your oncology and hematology care.   If you have a lab appointment with the [facility], please go directly to the [facility] and check in at the registration area.   Wear comfortable clothing and clothing appropriate for easy access to any Portacath or PICC line.   We strive to give you quality time with your provider. You may need to reschedule your appointment if you arrive late (15 or more minutes).  Arriving late affects you and other patients whose appointments are after yours.  Also, if you miss three or more appointments without notifying the office, you may be dismissed from the clinic at the providers discretion.      For prescription refill requests, have your pharmacy contact our office and allow 72 hours for refills to be completed.    Today you received the following chemotherapy and/or immunotherapy agents: Nivolumab (Opdivo)      To help prevent nausea and vomiting after your treatment, we encourage you to take your nausea medication as directed.  BELOW ARE SYMPTOMS THAT SHOULD BE REPORTED IMMEDIATELY: *FEVER GREATER THAN 100.4 F (38 C) OR HIGHER *CHILLS OR SWEATING *NAUSEA AND VOMITING THAT IS NOT CONTROLLED WITH YOUR NAUSEA MEDICATION *UNUSUAL SHORTNESS OF BREATH *UNUSUAL BRUISING OR BLEEDING *URINARY PROBLEMS (pain or burning when urinating, or frequent urination) *BOWEL PROBLEMS (unusual diarrhea, constipation, pain near the anus) TENDERNESS IN MOUTH AND THROAT WITH OR WITHOUT PRESENCE OF ULCERS (sore throat, sores in mouth, or a toothache) UNUSUAL RASH, SWELLING OR PAIN  UNUSUAL VAGINAL DISCHARGE OR ITCHING   Items with * indicate a potential emergency and should be followed up as soon as possible or go to the Emergency Department if any problems should occur.  Please show the CHEMOTHERAPY ALERT CARD or IMMUNOTHERAPY ALERT CARD at  check-in to the Emergency Department and triage nurse.  Should you have questions after your visit or need to cancel or reschedule your appointment, please contact [facility]  Dept: [phone]  and follow the prompts.  Office hours are 8:00 a.m. to 4:30 p.m. Monday - Friday. Please note that voicemails left after 4:00 p.m. may not be returned until the following business day.  We are closed weekends and major holidays. You have access to a nurse at all times for urgent questions. Please call the main number to the clinic Dept: [phone] and follow the prompts.   For any non-urgent questions, you may also contact your provider using MyChart. We now offer e-Visits for anyone 7 and older to request care online for non-urgent symptoms. For details visit [email or web address].   Also download the MyChart app! Go to the app store, search "MyChart", open the app, select [facility], and log in with your MyChart username and password.  Due to Covid, a mask is required upon entering the hospital/clinic. If you do not have a mask, one will be given to you upon arrival. For doctor visits, patients may have 1 support person aged 89 or older with them. For treatment visits, patients cannot have anyone with them due to current Covid guidelines and our immunocompromised population.

## 2021-04-24 NOTE — Progress Notes (Signed)
[facility] [phone]   [phone]  OFFICE PROGRESS NOTE  [name], [place] [address]  DIAGNOSIS: Recurrent non-small cell lung cancer initially diagnosed as stage IIIA (T1b, N2, M0) non-small cell lung cancer presented with right middle lobe pulmonary nodule, mediastinal lymphadenopathy and highly suspicious for small nodule in the left upper lobe that could change her stage to stage IV that could present another synchronous primary lesion in the left upper lobe. This was diagnosed in September 2017.  PRIOR THERAPY:  1) Concurrent chemoradiation with weekly carboplatin for AUC of 2 and paclitaxel 45 MG/M2 status post 6 cycles last dose was given 02/25/2016 with partial response. 2) Consolidation chemotherapy with carboplatin for AUC of 5 and paclitaxel 175 MG/M2 every 3 weeks with Neulasta support. First dose 04/22/2016. Status post 3 cycles. 3) Second line treatment with immunotherapy with Nivolumab 480 mg IV every 4 weeks status post 32 cycles.  Discontinued secondary to intolerance and frequent hospitalization with pneumonia and pneumonitis.  She had a break of treatment between November 24, 2019 until June 28, 2020.  CURRENT THERAPY: Resuming her treatment with immunotherapy with nivolumab 480 mg IV every 4 weeks.  Cycle #33 started on June 28, 2020.  Status post 42 cycles.  INTERVAL HISTORY: Courtney Zuniga 71 y.o. female returns to the clinic today for follow-up visit.  The patient is feeling fine except for the baseline shortness of breath and dry cough.  She also continues to have the intermittent headache that was investigated several times in the past with repeat MRIs of the brain with no clear finding of metastatic disease to the brain.  She has no current chest pain or hemoptysis.  She has no nausea, vomiting, diarrhea or constipation.  She has no fever or chills.  She is here today for evaluation before starting  cycle #43 of her treatment.   MEDICAL HISTORY: Past Medical History:  Diagnosis Date   Anemia    as a young woman   Arthritis    osteoartritis   Asthma    Brain tumor (benign) ([place]) 2005 [facility]   Benign   Chronic headaches    Chronic hip pain    Chronic pain    COPD (chronic obstructive pulmonary disease) (HCC)    Coronary artery disease    Depression    Depression 05/15/2016   Encounter for antineoplastic chemotherapy 01/10/2016   GERD (gastroesophageal reflux disease)    Hypertension    Lung cancer ([place]) dx'd 01/2016   currently on chemo and radiation    NSTEMI (non-ST elevated myocardial infarction) ([place]) yrs ago   On home O2    qhs 2 liters at hs and prn   Pneumonia last time 2 yrs ago   Shortness of breath dyspnea    with activity    ALLERGIES:  is allergic to no known allergies.  MEDICATIONS:  Current Outpatient Medications  Medication Sig Dispense Refill   acetaminophen (TYLENOL) 325 MG tablet Take 2 tablets (650 mg total) by mouth every 6 (six) hours as needed for mild pain, fever or headache. 12 tablet 2   albuterol (PROVENTIL HFA;VENTOLIN HFA) 108 (90 BASE) MCG/ACT inhaler Inhale 2 puffs into the lungs every 4 (four) hours as needed for shortness of breath.      atorvastatin (LIPITOR) 10 MG tablet Take 10 mg by mouth daily.     Cholecalciferol (VITAMIN D3) 125 MCG (5000 UT) CAPS Take 1 capsule by mouth daily.  0   cyclobenzaprine (FLEXERIL) 10 MG tablet Take 1 tablet (10 mg total) by mouth 2 (two) times daily. *MAy take one additional tablet as needed for muscle spasms (Patient taking differently: Take 10 mg by mouth 2 (two) times daily. *May take one additional tablet as needed for muscle spasms) 60 tablet 2   dextromethorphan-guaiFENesin (MUCINEX DM) 30-600 MG 12hr tablet Take 1 tablet by mouth 2 (two) times daily. 40 tablet 0   diclofenac sodium (VOLTAREN) 1 % GEL Apply topically 4 (four) times daily as needed (massge gel into affected area(s) as needed for  pain).   1   DULoxetine (CYMBALTA) 60 MG capsule Cymbalta 60 mg capsule,delayed release  Take 1 capsule every day by oral route.  stop 30 mg capsules     Dupilumab (DUPIXENT) 300 MG/2ML SOPN Inject 300 mg into the skin every 14 (fourteen) days. Twice a month     ENSURE (ENSURE) Take 237 mLs by mouth 3 (three) times daily between meals.     HYDROcodone bit-homatropine (HYCODAN) 5-1.5 MG/5ML syrup Take 5 mLs by mouth every 6 (six) hours as needed for cough. 120 mL 0   Ipratropium-Albuterol (COMBIVENT RESPIMAT) 20-100 MCG/ACT AERS respimat INHALE 2 PUFFS BY MOUTH FOUR TIMES DAILY AS NEEDED FOR WHEEZING     Ipratropium-Albuterol (COMBIVENT) 20-100 MCG/ACT AERS respimat Inhale 2 puffs into the lungs 4 (four) times daily as needed.     ipratropium-albuterol (DUONEB) 0.5-2.5 (3) MG/3ML SOLN Take 3 mLs by nebulization 3 (three) times daily. 360 mL 2   metoprolol tartrate (LOPRESSOR) 25 MG tablet Take 1 tablet (25 mg total) by mouth 2 (two) times daily. 60 tablet 0   oxyCODONE-acetaminophen (PERCOCET/ROXICET) 5-325 MG tablet Take 1 tablet by mouth every 8 (eight) hours as needed for moderate pain. 12 tablet 0   prochlorperazine (COMPAZINE) 10 MG tablet Take 1 tablet (10 mg total) by mouth every 6 (six) hours as needed for nausea or vomiting. 30 tablet 0   topiramate (TOPAMAX) 25 MG tablet Take 1 tablet by mouth daily at bedtime X 1 week; then increase to 25mg  BID. (Patient taking differently: Take 25 mg by mouth 2 (two) times daily.) 45 tablet 2   No current facility-administered medications for this visit.    SURGICAL HISTORY:  Past Surgical History:  Procedure Laterality Date   CHOLECYSTECTOMY     COLONOSCOPY  2015   Results requested from [facility]   COLONOSCOPY     ESOPHAGOGASTRODUODENOSCOPY N/A 08/14/2015   Procedure: ESOPHAGOGASTRODUODENOSCOPY (EGD);  Surgeon: Daneil Dolin, MD;  [facility];  Service: Endoscopy;  Laterality: N/A;  215    ESOPHAGOGASTRODUODENOSCOPY  (EGD) WITH PROPOFOL N/A 09/13/2015   Procedure: ESOPHAGOGASTRODUODENOSCOPY (EGD) WITH PROPOFOL;  Surgeon: Milus Banister, MD;  Location: [facility];  Service: Endoscopy;  Laterality: N/A;   EUS N/A 03/12/2017   Procedure: UPPER ENDOSCOPIC ULTRASOUND (EUS) RADIAL;  Surgeon: Milus Banister, MD;  Location: [facility];  Service: Endoscopy;  Laterality: N/A;   TUMOR REMOVAL  2005   Benign   UPPER ESOPHAGEAL ENDOSCOPIC ULTRASOUND (EUS)  09/13/2015   Procedure: UPPER ESOPHAGEAL ENDOSCOPIC ULTRASOUND (EUS);  Surgeon: Milus Banister, MD;  Location: [facility];  Service: Endoscopy;;   VIDEO BRONCHOSCOPY WITH ENDOBRONCHIAL NAVIGATION N/A 12/31/2015   Procedure: VIDEO BRONCHOSCOPY WITH ENDOBRONCHIAL NAVIGATION;  Surgeon: Melrose Nakayama, MD;  Location: [place];  Service: Thoracic;  Laterality: N/A;   VIDEO BRONCHOSCOPY WITH ENDOBRONCHIAL ULTRASOUND N/A 11/08/2015   Procedure: VIDEO BRONCHOSCOPY WITH ENDOBRONCHIAL ULTRASOUND;  Surgeon: Ivin Poot, MD;  Location: [facility] OR;  Service: Thoracic;  Laterality: N/A;    REVIEW OF SYSTEMS:  A comprehensive review of systems was negative except for: Constitutional: positive for fatigue Respiratory: positive for cough and dyspnea on exertion Neurological: positive for headaches   PHYSICAL EXAMINATION: General appearance: alert, cooperative, fatigued, and no distress Head: Normocephalic, without obvious abnormality, atraumatic Neck: no adenopathy, no JVD, supple, symmetrical, trachea midline, and thyroid not enlarged, symmetric, no tenderness/mass/nodules Lymph nodes: Cervical, supraclavicular, and axillary nodes normal. Resp: clear to auscultation bilaterally Back: symmetric, no curvature. ROM normal. No CVA tenderness. Cardio: regular rate and rhythm, S1, S2 normal, no murmur, click, rub or gallop GI: soft, non-tender; bowel sounds normal; no masses,  no organomegaly Extremities: extremities normal, atraumatic, no cyanosis or edema  ECOG PERFORMANCE  STATUS: 1 - Symptomatic but completely ambulatory  Blood pressure 119/83, pulse (!) 114, temperature (!) 97.4 F (36.3 C), temperature source Tympanic, resp. rate 19, height 5\' 6"  (1.676 m), weight 145 lb 11.2 oz (66.1 kg), SpO2 93 %.  LABORATORY DATA: Lab Results  Component Value Date   WBC 4.8 04/24/2021   HGB 13.9 04/24/2021   HCT 40.4 04/24/2021   MCV 85.6 04/24/2021   PLT 158 04/24/2021      Chemistry      Component Value Date/Time   NA 142 04/24/2021 0948   NA 144 04/09/2017 0948   K 4.2 04/24/2021 0948   K 4.3 04/09/2017 0948   CL 104 04/24/2021 0948   CO2 31 04/24/2021 0948   CO2 25 04/09/2017 0948   BUN 17 04/24/2021 0948   BUN 16.8 04/09/2017 0948   CREATININE 0.68 04/24/2021 0948   CREATININE 0.8 04/09/2017 0948      Component Value Date/Time   CALCIUM 9.6 04/24/2021 0948   CALCIUM 9.1 04/09/2017 0948   ALKPHOS 248 (H) 04/24/2021 0948   ALKPHOS 216 (H) 04/09/2017 0948   AST 21 04/24/2021 0948   AST 12 04/09/2017 0948   ALT 9 04/24/2021 0948   ALT 13 04/09/2017 0948   BILITOT 0.3 04/24/2021 0948   BILITOT 0.22 04/09/2017 0948       RADIOGRAPHIC STUDIES:-   CT Chest W Contrast  Result Date: 03/26/2021 CLINICAL DATA:  Restaging non-small cell lung cancer. EXAM: CT CHEST, ABDOMEN, AND PELVIS WITH CONTRAST TECHNIQUE: Multidetector CT imaging of the chest, abdomen and pelvis was performed following the standard protocol during bolus administration of intravenous contrast. CONTRAST:  27mL OMNIPAQUE IOHEXOL 350 MG/ML SOLN COMPARISON:  PET-CT 12/28/2020, CT chest, abdomen and pelvis 11/12/2020 FINDINGS: CT CHEST FINDINGS Cardiovascular: Normal heart size. No pericardial effusion. Aortic and coronary artery atherosclerotic calcifications. Mediastinum/Nodes: No enlarged mediastinal, hilar, or axillary lymph nodes. Thyroid gland, trachea, and esophagus demonstrate no significant findings. Lungs/Pleura: Moderate to severe centrilobular and paraseptal emphysema. No  pleural effusion, airspace consolidation or atelectasis. Masslike architectural distortion and fibrosis within the right perihilar region is again noted reflecting changes secondary to external beam radiation. This appears unchanged from 11/12/2020. The right upper lobe lung mass measures 2.8 by 1.7 by 3.8 cm, image 31/4 and image 77/5. This is compared with 3.1 x 1.7 by 4.1 cm previously (when remeasured). No new pulmonary nodule or mass identified. Musculoskeletal: No chest wall mass or suspicious bone lesions identified. CT ABDOMEN PELVIS FINDINGS Hepatobiliary: No focal liver abnormality. Previous cholecystectomy. Fusiform dilatation of the common bile duct appears unchanged measuring up to 1 cm. No signs of choledocholithiasis or obstructing mass. Pancreas: Unremarkable. No pancreatic ductal  dilatation or surrounding inflammatory changes. Spleen: Normal in size without focal abnormality. Adrenals/Urinary Tract: Adrenal glands are unremarkable. Small bilateral kidney cysts. No solid enhancing mass, nephrolithiasis or hydronephrosis. The urinary bladder is unremarkable. Bladder is unremarkable. Stomach/Bowel: Stomach is within normal limits. Appendix appears normal. No evidence of bowel wall thickening, distention, or inflammatory changes. Sigmoid diverticulosis without signs of acute diverticulitis. Vascular/Lymphatic: Aortic atherosclerosis. No aneurysm. No abdominopelvic adenopathy. Reproductive: Partially calcified fibroid uterus. No adnexal mass identified. Other: No abdominal wall hernia or abnormality. No abdominopelvic ascites. Musculoskeletal: No acute or significant osseous findings. Osteopenia. IMPRESSION: 1. No significant change in size of right upper lobe lung mass. No new or progressive disease identified. 2. Aortic Atherosclerosis (ICD10-I70.0) and Emphysema (ICD10-J43.9). 3. Coronary artery calcifications. Electronically Signed   By: Kerby Moors M.D.   On: 03/26/2021 06:02   CT Abdomen  Pelvis W Contrast  Result Date: 03/26/2021 CLINICAL DATA:  Restaging non-small cell lung cancer. EXAM: CT CHEST, ABDOMEN, AND PELVIS WITH CONTRAST TECHNIQUE: Multidetector CT imaging of the chest, abdomen and pelvis was performed following the standard protocol during bolus administration of intravenous contrast. CONTRAST:  47mL OMNIPAQUE IOHEXOL 350 MG/ML SOLN COMPARISON:  PET-CT 12/28/2020, CT chest, abdomen and pelvis 11/12/2020 FINDINGS: CT CHEST FINDINGS Cardiovascular: Normal heart size. No pericardial effusion. Aortic and coronary artery atherosclerotic calcifications. Mediastinum/Nodes: No enlarged mediastinal, hilar, or axillary lymph nodes. Thyroid gland, trachea, and esophagus demonstrate no significant findings. Lungs/Pleura: Moderate to severe centrilobular and paraseptal emphysema. No pleural effusion, airspace consolidation or atelectasis. Masslike architectural distortion and fibrosis within the right perihilar region is again noted reflecting changes secondary to external beam radiation. This appears unchanged from 11/12/2020. The right upper lobe lung mass measures 2.8 by 1.7 by 3.8 cm, image 31/4 and image 77/5. This is compared with 3.1 x 1.7 by 4.1 cm previously (when remeasured). No new pulmonary nodule or mass identified. Musculoskeletal: No chest wall mass or suspicious bone lesions identified. CT ABDOMEN PELVIS FINDINGS Hepatobiliary: No focal liver abnormality. Previous cholecystectomy. Fusiform dilatation of the common bile duct appears unchanged measuring up to 1 cm. No signs of choledocholithiasis or obstructing mass. Pancreas: Unremarkable. No pancreatic ductal dilatation or surrounding inflammatory changes. Spleen: Normal in size without focal abnormality. Adrenals/Urinary Tract: Adrenal glands are unremarkable. Small bilateral kidney cysts. No solid enhancing mass, nephrolithiasis or hydronephrosis. The urinary bladder is unremarkable. Bladder is unremarkable. Stomach/Bowel:  Stomach is within normal limits. Appendix appears normal. No evidence of bowel wall thickening, distention, or inflammatory changes. Sigmoid diverticulosis without signs of acute diverticulitis. Vascular/Lymphatic: Aortic atherosclerosis. No aneurysm. No abdominopelvic adenopathy. Reproductive: Partially calcified fibroid uterus. No adnexal mass identified. Other: No abdominal wall hernia or abnormality. No abdominopelvic ascites. Musculoskeletal: No acute or significant osseous findings. Osteopenia. IMPRESSION: 1. No significant change in size of right upper lobe lung mass. No new or progressive disease identified. 2. Aortic Atherosclerosis (ICD10-I70.0) and Emphysema (ICD10-J43.9). 3. Coronary artery calcifications. Electronically Signed   By: Kerby Moors M.D.   On: 03/26/2021 06:02     ASSESSMENT AND PLAN:  This is a very pleasant 71 years old African-American female with a recurrent non-small cell lung cancer initially diagnosed as stage IIIA non-small cell lung cancer status post concurrent chemoradiation followed by consolidation chemotherapy with carboplatin and paclitaxel for 3 cycles. The patient was followed by observation but restaging imaging studies showed evidence for disease progression. She was started on second line treatment with immunotherapy with Nivolumab 480 mg IV every 4 weeks status post 32 cycles.  The patient has been in observation since August 2021 with no concerning complaints except for the baseline shortness of breath and intermittent headache. The patient resumed her treatment with cycle number cycle #33 of single agent nivolumab 480 mg IV every 4 weeks in March 2022 status post 42 cycles. She continues to tolerate her treatment well with no concerning adverse effects. I recommended for her to proceed with cycle #43 today as planned. I will see her back for follow-up visit in 4 weeks for evaluation before the next cycle of her treatment. Further visual changes, she  will see her ophthalmologist for evaluation and management. She had several MRIs of the brain in the past that were unremarkable. The patient was advised to call immediately if she has any other concerning symptoms in the interval. All questions were answered. The patient knows to call the clinic with any problems, questions or concerns. We can certainly see the patient much sooner if necessary.  Disclaimer: This note was dictated with voice recognition software. Similar sounding words can inadvertently be transcribed and may not be corrected upon review.

## 2021-04-24 NOTE — Progress Notes (Signed)
Oncology Nurse Navigator Documentation  Oncology Nurse Navigator Flowsheets 04/24/2021 04/09/2017 02/22/2016 02/18/2016 01/30/2016 01/28/2016 01/25/2016  Abnormal Finding Date - - - - - - 11/22/2015  Confirmed Diagnosis Date - - - - - - 01/01/2016  Navigator Location [facility]  Referral Date to RadOnc/MedOnc - - - - - - -  Navigator Encounter Type Clinic/[facility] Telephone Treatment Treatment Other Clinic/[facility] Other  Telephone - Outgoing Call - - - - -  Treatment Initiated Date - - - - - - 01/15/2016  Patient Visit Type MedOnc - - MedOnc - MedOnc -  Treatment Phase Treatment Follow-up Treatment Treatment Treatment Treatment Treatment  Barriers/Navigation Needs Education/Patient is on IO therapy and is doing well with treatment. I help to explain her plan of care and she verbalized understanding.  Coordination of Therapist, occupational of Care  Education Other Other - - - - -  Interventions Education;Psycho-Social Support Coordination of Care;Education Other Other Coordination of Care Transportations Coordination of Care  Acuity Level 2-Minimal Needs (1-2 Barriers Identified) Level 2 Level 2 Level 2 Level 2 Level 2 Level 1  Coordination of Care - Appts - - Appts Other Other  Time Spent with Patient 48 18 [phone] 09 31 12 16

## 2021-04-24 NOTE — Progress Notes (Signed)
Per Dr. Julien Nordmann , it is okay to treat pt today with Nivolumab and heart rate of 114.

## 2021-05-15 ENCOUNTER — Encounter: Payer: Self-pay | Admitting: Internal Medicine

## 2021-05-22 ENCOUNTER — Inpatient Hospital Stay: Payer: Medicare Other | Attending: Physician Assistant

## 2021-05-22 ENCOUNTER — Inpatient Hospital Stay (HOSPITAL_BASED_OUTPATIENT_CLINIC_OR_DEPARTMENT_OTHER): Payer: Medicare Other | Admitting: Internal Medicine

## 2021-05-22 ENCOUNTER — Inpatient Hospital Stay: Payer: Medicare Other

## 2021-05-22 ENCOUNTER — Other Ambulatory Visit: Payer: Self-pay

## 2021-05-22 VITALS — BP 104/65 | HR 105 | Resp 17

## 2021-05-22 VITALS — BP 113/77 | HR 111 | Temp 97.1°F | Resp 20 | Ht 66.0 in | Wt 145.6 lb

## 2021-05-22 DIAGNOSIS — C342 Malignant neoplasm of middle lobe, bronchus or lung: Secondary | ICD-10-CM | POA: Diagnosis present

## 2021-05-22 DIAGNOSIS — C3491 Malignant neoplasm of unspecified part of right bronchus or lung: Secondary | ICD-10-CM

## 2021-05-22 DIAGNOSIS — Z9981 Dependence on supplemental oxygen: Secondary | ICD-10-CM

## 2021-05-22 DIAGNOSIS — Z79899 Other long term (current) drug therapy: Secondary | ICD-10-CM | POA: Insufficient documentation

## 2021-05-22 DIAGNOSIS — Z5112 Encounter for antineoplastic immunotherapy: Secondary | ICD-10-CM | POA: Insufficient documentation

## 2021-05-22 LAB — CBC WITH DIFFERENTIAL (CANCER CENTER ONLY)
Abs Immature Granulocytes: 0.01 10*3/uL (ref 0.00–0.07)
Basophils Absolute: 0 10*3/uL (ref 0.0–0.1)
Basophils Relative: 0 %
Eosinophils Absolute: 0.1 10*3/uL (ref 0.0–0.5)
Eosinophils Relative: 2 %
HCT: 37.6 % (ref 36.0–46.0)
Hemoglobin: 12.5 g/dL (ref 12.0–15.0)
Immature Granulocytes: 0 %
Lymphocytes Relative: 23 %
Lymphs Abs: 1.3 10*3/uL (ref 0.7–4.0)
MCH: 28.4 pg (ref 26.0–34.0)
MCHC: 33.2 g/dL (ref 30.0–36.0)
MCV: 85.5 fL (ref 80.0–100.0)
Monocytes Absolute: 0.5 10*3/uL (ref 0.1–1.0)
Monocytes Relative: 8 %
Neutro Abs: 3.6 10*3/uL (ref 1.7–7.7)
Neutrophils Relative %: 67 %
Platelet Count: 302 10*3/uL (ref 150–400)
RBC: 4.4 MIL/uL (ref 3.87–5.11)
RDW: 14.7 % (ref 11.5–15.5)
WBC Count: 5.4 10*3/uL (ref 4.0–10.5)
nRBC: 0 % (ref 0.0–0.2)

## 2021-05-22 LAB — CMP (CANCER CENTER ONLY)
ALT: 7 U/L (ref 0–44)
AST: 16 U/L (ref 15–41)
Albumin: 4 g/dL (ref 3.5–5.0)
Alkaline Phosphatase: 256 U/L — ABNORMAL HIGH (ref 38–126)
Anion gap: 5 (ref 5–15)
BUN: 16 mg/dL (ref 8–23)
CO2: 34 mmol/L — ABNORMAL HIGH (ref 22–32)
Calcium: 9.3 mg/dL (ref 8.9–10.3)
Chloride: 105 mmol/L (ref 98–111)
Creatinine: 0.56 mg/dL (ref 0.44–1.00)
GFR, Estimated: >60 mL/min (ref >60)
Glucose, Bld: 83 mg/dL (ref 70–99)
Potassium: 4 mmol/L (ref 3.5–5.1)
Sodium: 144 mmol/L (ref 135–145)
Total Bilirubin: 0.2 mg/dL — ABNORMAL LOW (ref 0.3–1.2)
Total Protein: 7 g/dL (ref 6.5–8.1)

## 2021-05-22 LAB — TSH: TSH: 3.82 u[IU]/mL (ref 0.308–3.960)

## 2021-05-22 MED ORDER — SODIUM CHLORIDE 0.9 % IV SOLN
480.0000 mg | Freq: Once | INTRAVENOUS | Status: AC
  Administered 2021-05-22: 480 mg via INTRAVENOUS
  Filled 2021-05-22: qty 48

## 2021-05-22 MED ORDER — SODIUM CHLORIDE 0.9 % IV SOLN: Freq: Once | INTRAVENOUS | Status: AC

## 2021-05-22 NOTE — Progress Notes (Signed)
Courtney [phone]) [phone]   Fax:(336) [phone]  OFFICE PROGRESS NOTE  Courtney Zuniga, Courtney [place] [address]  DIAGNOSIS: Recurrent non-small cell lung cancer initially diagnosed as stage IIIA (T1b, N2, M0) non-small cell lung cancer presented with right middle lobe pulmonary nodule, mediastinal lymphadenopathy and highly suspicious for small nodule in the left upper lobe that could change her stage to stage IV that could present another synchronous primary lesion in the left upper lobe. This was diagnosed in September 2017.  PRIOR THERAPY:  1) Concurrent chemoradiation with weekly carboplatin for AUC of 2 and paclitaxel 45 MG/M2 status post 6 cycles last dose was given 02/25/2016 with partial response. 2) Consolidation chemotherapy with carboplatin for AUC of 5 and paclitaxel 175 MG/M2 every 3 weeks with Neulasta support. First dose 04/22/2016. Status post 3 cycles. 3) Second line treatment with immunotherapy with Nivolumab 480 mg IV every 4 weeks status post 32 cycles.  Discontinued secondary to intolerance and frequent hospitalization with pneumonia and pneumonitis.  She had a break of treatment between November 24, 2019 until June 28, 2020.  CURRENT THERAPY: Resuming her treatment with immunotherapy with nivolumab 480 mg IV every 4 weeks.  Cycle #33 started on June 28, 2020.  Status post 43 cycles.  INTERVAL HISTORY: Julyana Zuniga 71 y.o. female returns to the clinic today for follow-up visit.  The patient is feeling fine today with no concerning complaints except for the baseline shortness of breath and she is currently on home oxygen.  She has no chest pain, cough or hemoptysis.  She denied having any fever or chills.  She has no nausea, vomiting, diarrhea or constipation.  She has no headache or visual changes.  She continues to tolerate her treatment with immunotherapy fairly well.  The patient is here today for evaluation before starting  cycle #44.   MEDICAL HISTORY: Past Medical History:  Diagnosis Date   Anemia    as a young woman   Arthritis    osteoartritis   Asthma    Brain tumor (benign) ([facility]) 2005 [facility]   Benign   Chronic headaches    Chronic hip pain    Chronic pain    COPD (chronic obstructive pulmonary disease) (HCC)    Coronary artery disease    Depression    Depression 05/15/2016   Encounter for antineoplastic chemotherapy 01/10/2016   GERD (gastroesophageal reflux disease)    Hypertension    Lung cancer ([facility]) dx'd 01/2016   currently on chemo and radiation    NSTEMI (non-ST elevated myocardial infarction) ([facility]) yrs ago   On home O2    qhs 2 liters at hs and prn   Pneumonia last time 2 yrs ago   Shortness of breath dyspnea    with activity    ALLERGIES:  is allergic to no known allergies.  MEDICATIONS:  Current Outpatient Medications  Medication Sig Dispense Refill   acetaminophen (TYLENOL) 325 MG tablet Take 2 tablets (650 mg total) by mouth every 6 (six) hours as needed for mild pain, fever or headache. 12 tablet 2   albuterol (PROVENTIL HFA;VENTOLIN HFA) 108 (90 BASE) MCG/ACT inhaler Inhale 2 puffs into the lungs every 4 (four) hours as needed for shortness of breath.      atorvastatin (LIPITOR) 10 MG tablet Take 10 mg by mouth daily.     Cholecalciferol (VITAMIN D3) 125 MCG (5000 UT) CAPS Take 1 capsule by mouth daily.  0   cyclobenzaprine (FLEXERIL)  10 MG tablet Take 1 tablet (10 mg total) by mouth 2 (two) times daily. *MAy take one additional tablet as needed for muscle spasms (Patient taking differently: Take 10 mg by mouth 2 (two) times daily. *May take one additional tablet as needed for muscle spasms) 60 tablet 2   dextromethorphan-guaiFENesin (MUCINEX DM) 30-600 MG 12hr tablet Take 1 tablet by mouth 2 (two) times daily. 40 tablet 0   diclofenac sodium (VOLTAREN) 1 % GEL Apply topically 4 (four) times daily as needed (massge gel into affected area(s) as needed for pain).   1    DULoxetine (CYMBALTA) 60 MG capsule Cymbalta 60 mg capsule,delayed release  Take 1 capsule every day by oral route.  stop 30 mg capsules     Dupilumab (DUPIXENT) 300 MG/2ML SOPN Inject 300 mg into the skin every 14 (fourteen) days. Twice a month     ENSURE (ENSURE) Take 237 mLs by mouth 3 (three) times daily between meals.     HYDROcodone bit-homatropine (HYCODAN) 5-1.5 MG/5ML syrup Take 5 mLs by mouth every 6 (six) hours as needed for cough. 120 mL 0   Ipratropium-Albuterol (COMBIVENT RESPIMAT) 20-100 MCG/ACT AERS respimat INHALE 2 PUFFS BY MOUTH FOUR TIMES DAILY AS NEEDED FOR WHEEZING     Ipratropium-Albuterol (COMBIVENT) 20-100 MCG/ACT AERS respimat Inhale 2 puffs into the lungs 4 (four) times daily as needed.     ipratropium-albuterol (DUONEB) 0.5-2.5 (3) MG/3ML SOLN Take 3 mLs by nebulization 3 (three) times daily. 360 mL 2   metoprolol tartrate (LOPRESSOR) 25 MG tablet Take 1 tablet (25 mg total) by mouth 2 (two) times daily. 60 tablet 0   oxyCODONE-acetaminophen (PERCOCET/ROXICET) 5-325 MG tablet Take 1 tablet by mouth every 8 (eight) hours as needed for moderate pain. 12 tablet 0   prochlorperazine (COMPAZINE) 10 MG tablet Take 1 tablet (10 mg total) by mouth every 6 (six) hours as needed for nausea or vomiting. 30 tablet 0   topiramate (TOPAMAX) 25 MG tablet Take 1 tablet by mouth daily at bedtime X 1 week; then increase to 25mg  BID. (Patient taking differently: Take 25 mg by mouth 2 (two) times daily.) 45 tablet 2   TRELEGY ELLIPTA 100-62.5-25 MCG/ACT AEPB Inhale 1 puff into the lungs daily.     Vitamin D, Ergocalciferol, (DRISDOL) 1.25 MG (50000 UNIT) CAPS capsule Take 50,000 Units by mouth once a week.     No current facility-administered medications for this visit.    SURGICAL HISTORY:  Past Surgical History:  Procedure Laterality Date   CHOLECYSTECTOMY     COLONOSCOPY  2015   Results requested from [facility]   COLONOSCOPY     ESOPHAGOGASTRODUODENOSCOPY N/A 08/14/2015    Procedure: ESOPHAGOGASTRODUODENOSCOPY (EGD);  Surgeon: Daneil Dolin, MD;  [facility];  Service: Endoscopy;  Laterality: N/A;  215    ESOPHAGOGASTRODUODENOSCOPY (EGD) WITH PROPOFOL N/A 09/13/2015   Procedure: ESOPHAGOGASTRODUODENOSCOPY (EGD) WITH PROPOFOL;  Surgeon: Milus Banister, MD;  Location: [facility];  Service: Endoscopy;  Laterality: N/A;   EUS N/A 03/12/2017   Procedure: UPPER ENDOSCOPIC ULTRASOUND (EUS) RADIAL;  Surgeon: Milus Banister, MD;  Location: [facility];  Service: Endoscopy;  Laterality: N/A;   TUMOR REMOVAL  2005   Benign   UPPER ESOPHAGEAL ENDOSCOPIC ULTRASOUND (EUS)  09/13/2015   Procedure: UPPER ESOPHAGEAL ENDOSCOPIC ULTRASOUND (EUS);  Surgeon: Milus Banister, MD;  Location: [facility];  Service: Endoscopy;;   VIDEO BRONCHOSCOPY WITH ENDOBRONCHIAL NAVIGATION N/A 12/31/2015   Procedure: VIDEO BRONCHOSCOPY WITH ENDOBRONCHIAL NAVIGATION;  Surgeon: Melrose Nakayama, MD;  Location: [facility];  Service: Thoracic;  Laterality: N/A;   VIDEO BRONCHOSCOPY WITH ENDOBRONCHIAL ULTRASOUND N/A 11/08/2015   Procedure: VIDEO BRONCHOSCOPY WITH ENDOBRONCHIAL ULTRASOUND;  Surgeon: Ivin Poot, MD;  Location: [facility];  Service: Thoracic;  Laterality: N/A;    REVIEW OF SYSTEMS:  A comprehensive review of systems was negative except for: Respiratory: positive for dyspnea on exertion   PHYSICAL EXAMINATION: General appearance: alert, cooperative, and no distress Head: Normocephalic, without obvious abnormality, atraumatic Neck: no adenopathy, no JVD, supple, symmetrical, trachea midline, and thyroid not enlarged, symmetric, no tenderness/mass/nodules Lymph nodes: Cervical, supraclavicular, and axillary nodes normal. Resp: clear to auscultation bilaterally Back: symmetric, no curvature. ROM normal. No CVA tenderness. Cardio: regular rate and rhythm, S1, S2 normal, no murmur, click, rub or gallop GI: soft, non-tender; bowel sounds normal; no masses,  no  organomegaly Extremities: extremities normal, atraumatic, no cyanosis or edema  ECOG PERFORMANCE STATUS: 1 - Symptomatic but completely ambulatory  Blood pressure 113/77, pulse (!) 111, temperature (!) 97.1 F (36.2 C), temperature source Tympanic, resp. rate 20, height 5\' 6"  (1.676 m), weight 145 lb 9.6 oz (66 kg), SpO2 (!) 89 %.  LABORATORY DATA: Lab Results  Component Value Date   WBC 5.4 05/22/2021   HGB 12.5 05/22/2021   HCT 37.6 05/22/2021   MCV 85.5 05/22/2021   PLT 302 05/22/2021      Chemistry      Component Value Date/Time   NA 142 04/24/2021 0948   NA 144 04/09/2017 0948   K 4.2 04/24/2021 0948   K 4.3 04/09/2017 0948   CL 104 04/24/2021 0948   CO2 31 04/24/2021 0948   CO2 25 04/09/2017 0948   BUN 17 04/24/2021 0948   BUN 16.8 04/09/2017 0948   CREATININE 0.68 04/24/2021 0948   CREATININE 0.8 04/09/2017 0948      Component Value Date/Time   CALCIUM 9.6 04/24/2021 0948   CALCIUM 9.1 04/09/2017 0948   ALKPHOS 248 (H) 04/24/2021 0948   ALKPHOS 216 (H) 04/09/2017 0948   AST 21 04/24/2021 0948   AST 12 04/09/2017 0948   ALT 9 04/24/2021 0948   ALT 13 04/09/2017 0948   BILITOT 0.3 04/24/2021 0948   BILITOT 0.22 04/09/2017 0948       RADIOGRAPHIC STUDIES:-   No results found.   ASSESSMENT AND PLAN:  This is a very pleasant 71 years old African-American female with a recurrent non-small cell lung cancer initially diagnosed as stage IIIA non-small cell lung cancer status post concurrent chemoradiation followed by consolidation chemotherapy with carboplatin and paclitaxel for 3 cycles. The patient was followed by observation but restaging imaging studies showed evidence for disease progression. She was started on second line treatment with immunotherapy with Nivolumab 480 mg IV every 4 weeks status post 32 cycles.   The patient has been in observation since August 2021 with no concerning complaints except for the baseline shortness of breath and intermittent  headache. The patient resumed her treatment with cycle number cycle #33 of single agent nivolumab 480 mg IV every 4 weeks in March 2022 status post 43 cycles. She has been tolerating this treatment well with no concerning adverse effects. I recommended for the patient to proceed with cycle #44 today as planned. She will come back for follow-up visit in 4 weeks for evaluation before the next cycle of her treatment. I will consider repeating her imaging studies after the next cycle of her treatment. She was advised to call  immediately if she has any other concerning symptoms in the interval. All questions were answered. The patient knows to call the clinic with any problems, questions or concerns. We can certainly see the patient much sooner if necessary.  Disclaimer: This note was dictated with voice recognition software. Similar sounding words can inadvertently be transcribed and may not be corrected upon review.

## 2021-05-22 NOTE — Patient Instructions (Signed)
[facility]   Discharge Instructions: Thank you for choosing [facility] to provide your oncology and hematology care.   If you have a lab appointment with the [facility], please go directly to the [facility] and check in at the registration area.   Wear comfortable clothing and clothing appropriate for easy access to any Portacath or PICC line.   We strive to give you quality time with your provider. You may need to reschedule your appointment if you arrive late (15 or more minutes).  Arriving late affects you and other patients whose appointments are after yours.  Also, if you miss three or more appointments without notifying the office, you may be dismissed from the clinic at the providers discretion.      For prescription refill requests, have your pharmacy contact our office and allow 72 hours for refills to be completed.    Today you received the following chemotherapy and/or immunotherapy agents: Nivolumab (Opdivo)      To help prevent nausea and vomiting after your treatment, we encourage you to take your nausea medication as directed.  BELOW ARE SYMPTOMS THAT SHOULD BE REPORTED IMMEDIATELY: *FEVER GREATER THAN 100.4 F (38 C) OR HIGHER *CHILLS OR SWEATING *NAUSEA AND VOMITING THAT IS NOT CONTROLLED WITH YOUR NAUSEA MEDICATION *UNUSUAL SHORTNESS OF BREATH *UNUSUAL BRUISING OR BLEEDING *URINARY PROBLEMS (pain or burning when urinating, or frequent urination) *BOWEL PROBLEMS (unusual diarrhea, constipation, pain near the anus) TENDERNESS IN MOUTH AND THROAT WITH OR WITHOUT PRESENCE OF ULCERS (sore throat, sores in mouth, or a toothache) UNUSUAL RASH, SWELLING OR PAIN  UNUSUAL VAGINAL DISCHARGE OR ITCHING   Items with * indicate a potential emergency and should be followed up as soon as possible or go to the Emergency Department if any problems should occur.  Please show the CHEMOTHERAPY ALERT CARD or IMMUNOTHERAPY ALERT CARD at  check-in to the Emergency Department and triage nurse.  Should you have questions after your visit or need to cancel or reschedule your appointment, please contact [facility]  Dept: [phone]  and follow the prompts.  Office hours are 8:00 a.m. to 4:30 p.m. Monday - Friday. Please note that voicemails left after 4:00 p.m. may not be returned until the following business day.  We are closed weekends and major holidays. You have access to a nurse at all times for urgent questions. Please call the main number to the clinic Dept: [phone] and follow the prompts.   For any non-urgent questions, you may also contact your provider using MyChart. We now offer e-Visits for anyone 36 and older to request care online for non-urgent symptoms. For details visit [email or web address].   Also download the MyChart app! Go to the app store, search "MyChart", open the app, select [facility], and log in with your MyChart username and password.  Due to Covid, a mask is required upon entering the hospital/clinic. If you do not have a mask, one will be given to you upon arrival. For doctor visits, patients may have 1 support person aged 54 or older with them. For treatment visits, patients cannot have anyone with them due to current Covid guidelines and our immunocompromised population.

## 2021-06-19 ENCOUNTER — Inpatient Hospital Stay (HOSPITAL_BASED_OUTPATIENT_CLINIC_OR_DEPARTMENT_OTHER): Payer: Medicare Other | Admitting: Internal Medicine

## 2021-06-19 ENCOUNTER — Other Ambulatory Visit: Payer: Self-pay | Admitting: Internal Medicine

## 2021-06-19 ENCOUNTER — Other Ambulatory Visit: Payer: Self-pay

## 2021-06-19 ENCOUNTER — Inpatient Hospital Stay: Payer: Medicare Other

## 2021-06-19 ENCOUNTER — Inpatient Hospital Stay: Payer: Medicare Other | Attending: Physician Assistant

## 2021-06-19 VITALS — BP 143/93 | HR 100 | Temp 97.6°F | Resp 20 | Ht 66.0 in | Wt 139.2 lb

## 2021-06-19 DIAGNOSIS — Z5112 Encounter for antineoplastic immunotherapy: Secondary | ICD-10-CM | POA: Insufficient documentation

## 2021-06-19 DIAGNOSIS — C3491 Malignant neoplasm of unspecified part of right bronchus or lung: Secondary | ICD-10-CM

## 2021-06-19 DIAGNOSIS — Z79899 Other long term (current) drug therapy: Secondary | ICD-10-CM | POA: Diagnosis not present

## 2021-06-19 DIAGNOSIS — C342 Malignant neoplasm of middle lobe, bronchus or lung: Secondary | ICD-10-CM | POA: Diagnosis present

## 2021-06-19 DIAGNOSIS — C3490 Malignant neoplasm of unspecified part of unspecified bronchus or lung: Secondary | ICD-10-CM

## 2021-06-19 LAB — CMP (CANCER CENTER ONLY)
ALT: 8 U/L (ref 0–44)
AST: 19 U/L (ref 15–41)
Albumin: 4.2 g/dL (ref 3.5–5.0)
Alkaline Phosphatase: 299 U/L — ABNORMAL HIGH (ref 38–126)
Anion gap: 7 (ref 5–15)
BUN: 17 mg/dL (ref 8–23)
CO2: 30 mmol/L (ref 22–32)
Calcium: 9.5 mg/dL (ref 8.9–10.3)
Chloride: 107 mmol/L (ref 98–111)
Creatinine: 0.6 mg/dL (ref 0.44–1.00)
GFR, Estimated: >60 mL/min (ref >60)
Glucose, Bld: 56 mg/dL — ABNORMAL LOW (ref 70–99)
Potassium: 3.7 mmol/L (ref 3.5–5.1)
Sodium: 144 mmol/L (ref 135–145)
Total Bilirubin: 0.2 mg/dL — ABNORMAL LOW (ref 0.3–1.2)
Total Protein: 6.8 g/dL (ref 6.5–8.1)

## 2021-06-19 LAB — CBC WITH DIFFERENTIAL (CANCER CENTER ONLY)
Abs Immature Granulocytes: 0 10*3/uL (ref 0.00–0.07)
Basophils Absolute: 0 10*3/uL (ref 0.0–0.1)
Basophils Relative: 1 %
Eosinophils Absolute: 0.1 10*3/uL (ref 0.0–0.5)
Eosinophils Relative: 2 %
HCT: 39.6 % (ref 36.0–46.0)
Hemoglobin: 13.5 g/dL (ref 12.0–15.0)
Immature Granulocytes: 0 %
Lymphocytes Relative: 26 %
Lymphs Abs: 1.1 10*3/uL (ref 0.7–4.0)
MCH: 29.2 pg (ref 26.0–34.0)
MCHC: 34.1 g/dL (ref 30.0–36.0)
MCV: 85.7 fL (ref 80.0–100.0)
Monocytes Absolute: 0.4 10*3/uL (ref 0.1–1.0)
Monocytes Relative: 8 %
Neutro Abs: 2.8 10*3/uL (ref 1.7–7.7)
Neutrophils Relative %: 63 %
Platelet Count: 216 10*3/uL (ref 150–400)
RBC: 4.62 MIL/uL (ref 3.87–5.11)
RDW: 15.2 % (ref 11.5–15.5)
WBC Count: 4.4 10*3/uL (ref 4.0–10.5)
nRBC: 0 % (ref 0.0–0.2)

## 2021-06-19 LAB — TSH: TSH: 1.595 u[IU]/mL (ref 0.308–3.960)

## 2021-06-19 MED ORDER — SODIUM CHLORIDE 0.9 % IV SOLN: Freq: Once | INTRAVENOUS | Status: AC

## 2021-06-19 MED ORDER — SODIUM CHLORIDE 0.9 % IV SOLN
480.0000 mg | Freq: Once | INTRAVENOUS | Status: AC
  Administered 2021-06-19: 480 mg via INTRAVENOUS
  Filled 2021-06-19: qty 48

## 2021-06-19 NOTE — Patient Instructions (Signed)
[facility]  Discharge Instructions: ?Thank you for choosing [facility] to provide your oncology and hematology care.  ? ?If you have a lab appointment with the Cancer Center, please go directly to the Cancer Center and check in at the registration area. ?  ?Wear comfortable clothing and clothing appropriate for easy access to any Portacath or PICC line.  ? ?We strive to give you quality time with your provider. You may need to reschedule your appointment if you arrive late (15 or more minutes).  Arriving late affects you and other patients whose appointments are after yours.  Also, if you miss three or more appointments without notifying the office, you may be dismissed from the clinic at the provider?s discretion.    ?  ?For prescription refill requests, have your pharmacy contact our office and allow 72 hours for refills to be completed.   ? ?Today you received the following chemotherapy and/or immunotherapy agents Opdivo    ?  ?To help prevent nausea and vomiting after your treatment, we encourage you to take your nausea medication as directed. ? ?BELOW ARE SYMPTOMS THAT SHOULD BE REPORTED IMMEDIATELY: ?*FEVER GREATER THAN 100.4 F (38 ?C) OR HIGHER ?*CHILLS OR SWEATING ?*NAUSEA AND VOMITING THAT IS NOT CONTROLLED WITH YOUR NAUSEA MEDICATION ?*UNUSUAL SHORTNESS OF BREATH ?*UNUSUAL BRUISING OR BLEEDING ?*URINARY PROBLEMS (pain or burning when urinating, or frequent urination) ?*BOWEL PROBLEMS (unusual diarrhea, constipation, pain near the anus) ?TENDERNESS IN MOUTH AND THROAT WITH OR WITHOUT PRESENCE OF ULCERS (sore throat, sores in mouth, or a toothache) ?UNUSUAL RASH, SWELLING OR PAIN  ?UNUSUAL VAGINAL DISCHARGE OR ITCHING  ? ?Items with * indicate a potential emergency and should be followed up as soon as possible or go to the Emergency Department if any problems should occur. ? ?Please show the CHEMOTHERAPY ALERT CARD or IMMUNOTHERAPY ALERT CARD at check-in to the  Emergency Department and triage nurse. ? ?Should you have questions after your visit or need to cancel or reschedule your appointment, please contact [facility]  Dept: [phone]  and follow the prompts.  Office hours are 8:00 a.m. to 4:30 p.m. Monday - Friday. Please note that voicemails left after 4:00 p.m. may not be returned until the following business day.  We are closed weekends and major holidays. You have access to a nurse at all times for urgent questions. Please call the main number to the clinic Dept: [phone] and follow the prompts. ? ? ?For any non-urgent questions, you may also contact your provider using MyChart. We now offer e-Visits for anyone 18 and older to request care online for non-urgent symptoms. For details visit [email or web address]. ?  ?Also download the MyChart app! Go to the app store, search "MyChart", open the app, select [facility], and log in with your MyChart username and password. ? ?Due to Covid, a mask is required upon entering the hospital/clinic. If you do not have a mask, one will be given to you upon arrival. For doctor visits, patients may have 1 support person aged 18 or older with them. For treatment visits, patients cannot have anyone with them due to current Covid guidelines and our immunocompromised population.  ? ?

## 2021-06-19 NOTE — Progress Notes (Signed)
[place] Telephone:(336) [phone]   Fax:([phone]  OFFICE PROGRESS NOTE  [name], [place] [address]  DIAGNOSIS: Recurrent non-small cell lung cancer initially diagnosed as stage IIIA (T1b, N2, M0) non-small cell lung cancer presented with right middle lobe pulmonary nodule, mediastinal lymphadenopathy and highly suspicious for small nodule in the left upper lobe that could change her stage to stage IV that could present another synchronous primary lesion in the left upper lobe. This was diagnosed in September 2017.  PRIOR THERAPY:  1) Concurrent chemoradiation with weekly carboplatin for AUC of 2 and paclitaxel 45 MG/M2 status post 6 cycles last dose was given 02/25/2016 with partial response. 2) Consolidation chemotherapy with carboplatin for AUC of 5 and paclitaxel 175 MG/M2 every 3 weeks with Neulasta support. First dose 04/22/2016. Status post 3 cycles. 3) Second line treatment with immunotherapy with Nivolumab 480 mg IV every 4 weeks status post 32 cycles.  Discontinued secondary to intolerance and frequent hospitalization with pneumonia and pneumonitis.  She had a break of treatment between November 24, 2019 until June 28, 2020.  CURRENT THERAPY: Resuming her treatment with immunotherapy with nivolumab 480 mg IV every 4 weeks.  Cycle #33 started on June 28, 2020.  Status post 44 cycles.  INTERVAL HISTORY: Courtney Zuniga 71 y.o. female returns to the clinic today for follow-up visit.  The patient continues to have the baseline shortness of breath and she is currently on home oxygen.  She was started on nebulizer by her primary care physician and she is using it 3 times a day.  She denied having any current chest pain, but has mild cough with no hemoptysis.  She has no nausea, vomiting, diarrhea or constipation.  She lost few pounds since her last visit secondary to lack of appetite.  She continues to tolerate her treatment with nivolumab  fairly well.  The patient is here today for evaluation before starting cycle #45.  MEDICAL HISTORY: Past Medical History:  Diagnosis Date   Anemia    as a young woman   Arthritis    osteoartritis   Asthma    Brain tumor (benign) ([facility]) 2005 [facility]   Benign   Chronic headaches    Chronic hip pain    Chronic pain    COPD (chronic obstructive pulmonary disease) (HCC)    Coronary artery disease    Depression    Depression 05/15/2016   Encounter for antineoplastic chemotherapy 01/10/2016   GERD (gastroesophageal reflux disease)    Hypertension    Lung cancer ([facility]) dx'd 01/2016   currently on chemo and radiation    NSTEMI (non-ST elevated myocardial infarction) ([place]) yrs ago   On home O2    qhs 2 liters at hs and prn   Pneumonia last time 2 yrs ago   Shortness of breath dyspnea    with activity    ALLERGIES:  is allergic to no known allergies.  MEDICATIONS:  Current Outpatient Medications  Medication Sig Dispense Refill   acetaminophen (TYLENOL) 325 MG tablet Take 2 tablets (650 mg total) by mouth every 6 (six) hours as needed for mild pain, fever or headache. 12 tablet 2   albuterol (PROVENTIL HFA;VENTOLIN HFA) 108 (90 BASE) MCG/ACT inhaler Inhale 2 puffs into the lungs every 4 (four) hours as needed for shortness of breath.      atorvastatin (LIPITOR) 10 MG tablet Take 10 mg by mouth daily.     Cholecalciferol (VITAMIN D3) 125  MCG (5000 UT) CAPS Take 1 capsule by mouth daily.  0   cyclobenzaprine (FLEXERIL) 10 MG tablet Take 1 tablet (10 mg total) by mouth 2 (two) times daily. *MAy take one additional tablet as needed for muscle spasms (Patient taking differently: Take 10 mg by mouth 2 (two) times daily. *May take one additional tablet as needed for muscle spasms) 60 tablet 2   dextromethorphan-guaiFENesin (MUCINEX DM) 30-600 MG 12hr tablet Take 1 tablet by mouth 2 (two) times daily. 40 tablet 0   diclofenac sodium (VOLTAREN) 1 % GEL Apply topically 4 (four) times daily as needed  (massge gel into affected area(s) as needed for pain).   1   DULoxetine (CYMBALTA) 60 MG capsule Cymbalta 60 mg capsule,delayed release  Take 1 capsule every day by oral route.  stop 30 mg capsules     Dupilumab (DUPIXENT) 300 MG/2ML SOPN Inject 300 mg into the skin every 14 (fourteen) days. Twice a month     ENSURE (ENSURE) Take 237 mLs by mouth 3 (three) times daily between meals.     HYDROcodone bit-homatropine (HYCODAN) 5-1.5 MG/5ML syrup Take 5 mLs by mouth every 6 (six) hours as needed for cough. 120 mL 0   Ipratropium-Albuterol (COMBIVENT RESPIMAT) 20-100 MCG/ACT AERS respimat INHALE 2 PUFFS BY MOUTH FOUR TIMES DAILY AS NEEDED FOR WHEEZING     Ipratropium-Albuterol (COMBIVENT) 20-100 MCG/ACT AERS respimat Inhale 2 puffs into the lungs 4 (four) times daily as needed.     ipratropium-albuterol (DUONEB) 0.5-2.5 (3) MG/3ML SOLN Take 3 mLs by nebulization 3 (three) times daily. 360 mL 2   metoprolol tartrate (LOPRESSOR) 25 MG tablet Take 1 tablet (25 mg total) by mouth 2 (two) times daily. 60 tablet 0   oxyCODONE-acetaminophen (PERCOCET/ROXICET) 5-325 MG tablet Take 1 tablet by mouth every 8 (eight) hours as needed for moderate pain. 12 tablet 0   prochlorperazine (COMPAZINE) 10 MG tablet Take 1 tablet (10 mg total) by mouth every 6 (six) hours as needed for nausea or vomiting. 30 tablet 0   topiramate (TOPAMAX) 25 MG tablet Take 1 tablet by mouth daily at bedtime X 1 week; then increase to 25mg  BID. (Patient taking differently: Take 25 mg by mouth 2 (two) times daily.) 45 tablet 2   TRELEGY ELLIPTA 100-62.5-25 MCG/ACT AEPB Inhale 1 puff into the lungs daily.     Vitamin D, Ergocalciferol, (DRISDOL) 1.25 MG (50000 UNIT) CAPS capsule Take 50,000 Units by mouth once a week.     No current facility-administered medications for this visit.    SURGICAL HISTORY:  Past Surgical History:  Procedure Laterality Date   CHOLECYSTECTOMY     COLONOSCOPY  2015   Results requested from [facility]   COLONOSCOPY     ESOPHAGOGASTRODUODENOSCOPY N/A 08/14/2015   Procedure: ESOPHAGOGASTRODUODENOSCOPY (EGD);  Surgeon: Daneil Dolin, MD;  [facility];  Service: Endoscopy;  Laterality: N/A;  215    ESOPHAGOGASTRODUODENOSCOPY (EGD) WITH PROPOFOL N/A 09/13/2015   Procedure: ESOPHAGOGASTRODUODENOSCOPY (EGD) WITH PROPOFOL;  Surgeon: Milus Banister, MD;  [facility];  Service: Endoscopy;  Laterality: N/A;   EUS N/A 03/12/2017   Procedure: UPPER ENDOSCOPIC ULTRASOUND (EUS) RADIAL;  Surgeon: Milus Banister, MD;  [facility];  Service: Endoscopy;  Laterality: N/A;   TUMOR REMOVAL  2005   Benign   UPPER ESOPHAGEAL ENDOSCOPIC ULTRASOUND (EUS)  09/13/2015   Procedure: UPPER ESOPHAGEAL ENDOSCOPIC ULTRASOUND (EUS);  Surgeon: Milus Banister, MD;  Location: [facility];  Service: Endoscopy;;  VIDEO BRONCHOSCOPY WITH ENDOBRONCHIAL NAVIGATION N/A 12/31/2015   Procedure: VIDEO BRONCHOSCOPY WITH ENDOBRONCHIAL NAVIGATION;  Surgeon: Melrose Nakayama, MD;  Location: [place];  Service: Thoracic;  Laterality: N/A;   VIDEO BRONCHOSCOPY WITH ENDOBRONCHIAL ULTRASOUND N/A 11/08/2015   Procedure: VIDEO BRONCHOSCOPY WITH ENDOBRONCHIAL ULTRASOUND;  Surgeon: Ivin Poot, MD;  [facility];  Service: Thoracic;  Laterality: N/A;    REVIEW OF SYSTEMS:  A comprehensive review of systems was negative except for: Constitutional: positive for fatigue Respiratory: positive for cough and dyspnea on exertion   PHYSICAL EXAMINATION: General appearance: alert, cooperative, fatigued, and no distress Head: Normocephalic, without obvious abnormality, atraumatic Neck: no adenopathy, no JVD, supple, symmetrical, trachea midline, and thyroid not enlarged, symmetric, no tenderness/mass/nodules Lymph nodes: Cervical, supraclavicular, and axillary nodes normal. Resp: clear to auscultation bilaterally Back: symmetric, no curvature. ROM normal. No CVA tenderness. Cardio: regular rate and  rhythm, S1, S2 normal, no murmur, click, rub or gallop GI: soft, non-tender; bowel sounds normal; no masses,  no organomegaly Extremities: extremities normal, atraumatic, no cyanosis or edema  ECOG PERFORMANCE STATUS: 1 - Symptomatic but completely ambulatory  Blood pressure (!) 143/93, pulse 100, temperature 97.6 F (36.4 C), temperature source Tympanic, resp. rate 20, height 5\' 6"  (1.676 m), weight 139 lb 3.2 oz (63.1 kg), SpO2 94 %.  LABORATORY DATA: Lab Results  Component Value Date   WBC 4.4 06/19/2021   HGB 13.5 06/19/2021   HCT 39.6 06/19/2021   MCV 85.7 06/19/2021   PLT 216 06/19/2021      Chemistry      Component Value Date/Time   NA 144 05/22/2021 0916   NA 144 04/09/2017 0948   K 4.0 05/22/2021 0916   K 4.3 04/09/2017 0948   CL 105 05/22/2021 0916   CO2 34 (H) 05/22/2021 0916   CO2 25 04/09/2017 0948   BUN 16 05/22/2021 0916   BUN 16.8 04/09/2017 0948   CREATININE 0.56 05/22/2021 0916   CREATININE 0.8 04/09/2017 0948      Component Value Date/Time   CALCIUM 9.3 05/22/2021 0916   CALCIUM 9.1 04/09/2017 0948   ALKPHOS 256 (H) 05/22/2021 0916   ALKPHOS 216 (H) 04/09/2017 0948   AST 16 05/22/2021 0916   AST 12 04/09/2017 0948   ALT 7 05/22/2021 0916   ALT 13 04/09/2017 0948   BILITOT 0.2 (L) 05/22/2021 0916   BILITOT 0.22 04/09/2017 0948       RADIOGRAPHIC STUDIES:-   No results found.   ASSESSMENT AND PLAN:  This is a very pleasant 71 years old African-American female with a recurrent non-small cell lung cancer initially diagnosed as stage IIIA non-small cell lung cancer status post concurrent chemoradiation followed by consolidation chemotherapy with carboplatin and paclitaxel for 3 cycles. The patient was followed by observation but restaging imaging studies showed evidence for disease progression. She was started on second line treatment with immunotherapy with Nivolumab 480 mg IV every 4 weeks status post 32 cycles.   The patient has been in  observation since August 2021 with no concerning complaints except for the baseline shortness of breath and intermittent headache. The patient resumed her treatment with cycle number cycle #33 of single agent nivolumab 480 mg IV every 4 weeks in March 2022 status post 43 cycles. The patient has been tolerating this treatment well with no concerning adverse effects. I recommended for her to proceed with cycle #44 today as planned. I will see her back for follow-up visit in 4 weeks for evaluation with repeat  CT scan of the chest, abdomen and pelvis for restaging of her disease. For the history of COPD, she will continue on home oxygen and nebulizer as prescribed by her primary care physician and pulmonologist. For the intermittent headache and visual changes, she is scheduled to see an ophthalmologist soon. The patient was advised to call immediately if she has any other concerning symptoms in the interval. All questions were answered. The patient knows to call the clinic with any problems, questions or concerns. We can certainly see the patient much sooner if necessary.  Disclaimer: This note was dictated with voice recognition software. Similar sounding words can inadvertently be transcribed and may not be corrected upon review.

## 2021-07-15 ENCOUNTER — Ambulatory Visit (HOSPITAL_COMMUNITY)
Admission: RE | Admit: 2021-07-15 | Discharge: 2021-07-15 | Disposition: A | Discharge status: Home / Self Care | Payer: Medicare Other | Source: Ambulatory Visit | Attending: Internal Medicine | Admitting: Internal Medicine

## 2021-07-15 DIAGNOSIS — C3490 Malignant neoplasm of unspecified part of unspecified bronchus or lung: Secondary | ICD-10-CM | POA: Diagnosis present

## 2021-07-15 MED ORDER — SODIUM CHLORIDE (PF) 0.9 % IJ SOLN
INTRAMUSCULAR | Status: AC
  Filled 2021-07-15: qty 50

## 2021-07-15 MED ORDER — IOHEXOL 300 MG/ML  SOLN
100.0000 mL | Freq: Once | INTRAMUSCULAR | Status: AC | PRN
  Administered 2021-07-15: 100 mL via INTRAVENOUS

## 2021-07-16 ENCOUNTER — Other Ambulatory Visit: Payer: Self-pay

## 2021-07-16 DIAGNOSIS — C3491 Malignant neoplasm of unspecified part of right bronchus or lung: Secondary | ICD-10-CM

## 2021-07-17 ENCOUNTER — Inpatient Hospital Stay: Payer: Medicare Other

## 2021-07-17 ENCOUNTER — Other Ambulatory Visit: Payer: Self-pay

## 2021-07-17 ENCOUNTER — Inpatient Hospital Stay (HOSPITAL_BASED_OUTPATIENT_CLINIC_OR_DEPARTMENT_OTHER): Payer: Medicare Other | Admitting: Internal Medicine

## 2021-07-17 ENCOUNTER — Inpatient Hospital Stay: Payer: Medicare Other | Attending: Physician Assistant

## 2021-07-17 ENCOUNTER — Encounter: Payer: Self-pay | Admitting: *Deleted

## 2021-07-17 ENCOUNTER — Encounter: Payer: Self-pay | Admitting: Internal Medicine

## 2021-07-17 VITALS — BP 132/77 | HR 105 | Temp 97.4°F | Resp 20 | Ht 66.0 in | Wt 136.8 lb

## 2021-07-17 DIAGNOSIS — Z5112 Encounter for antineoplastic immunotherapy: Secondary | ICD-10-CM | POA: Insufficient documentation

## 2021-07-17 DIAGNOSIS — Z79899 Other long term (current) drug therapy: Secondary | ICD-10-CM | POA: Insufficient documentation

## 2021-07-17 DIAGNOSIS — C342 Malignant neoplasm of middle lobe, bronchus or lung: Secondary | ICD-10-CM | POA: Insufficient documentation

## 2021-07-17 DIAGNOSIS — C3491 Malignant neoplasm of unspecified part of right bronchus or lung: Secondary | ICD-10-CM | POA: Diagnosis not present

## 2021-07-17 DIAGNOSIS — C3490 Malignant neoplasm of unspecified part of unspecified bronchus or lung: Secondary | ICD-10-CM

## 2021-07-17 LAB — CMP (CANCER CENTER ONLY)
ALT: 8 U/L (ref 0–44)
AST: 18 U/L (ref 15–41)
Albumin: 4.1 g/dL (ref 3.5–5.0)
Alkaline Phosphatase: 291 U/L — ABNORMAL HIGH (ref 38–126)
Anion gap: 7 (ref 5–15)
BUN: 16 mg/dL (ref 8–23)
CO2: 31 mmol/L (ref 22–32)
Calcium: 9.2 mg/dL (ref 8.9–10.3)
Chloride: 106 mmol/L (ref 98–111)
Creatinine: 0.69 mg/dL (ref 0.44–1.00)
GFR, Estimated: >60 mL/min (ref >60)
Glucose, Bld: 69 mg/dL — ABNORMAL LOW (ref 70–99)
Potassium: 3.7 mmol/L (ref 3.5–5.1)
Sodium: 144 mmol/L (ref 135–145)
Total Bilirubin: 0.1 mg/dL — ABNORMAL LOW (ref 0.3–1.2)
Total Protein: 7 g/dL (ref 6.5–8.1)

## 2021-07-17 LAB — CBC WITH DIFFERENTIAL (CANCER CENTER ONLY)
Abs Immature Granulocytes: 0.01 10*3/uL (ref 0.00–0.07)
Basophils Absolute: 0 10*3/uL (ref 0.0–0.1)
Basophils Relative: 0 %
Eosinophils Absolute: 0.1 10*3/uL (ref 0.0–0.5)
Eosinophils Relative: 2 %
HCT: 39.7 % (ref 36.0–46.0)
Hemoglobin: 13.5 g/dL (ref 12.0–15.0)
Immature Granulocytes: 0 %
Lymphocytes Relative: 25 %
Lymphs Abs: 1.2 10*3/uL (ref 0.7–4.0)
MCH: 29.3 pg (ref 26.0–34.0)
MCHC: 34 g/dL (ref 30.0–36.0)
MCV: 86.1 fL (ref 80.0–100.0)
Monocytes Absolute: 0.4 10*3/uL (ref 0.1–1.0)
Monocytes Relative: 8 %
Neutro Abs: 3.2 10*3/uL (ref 1.7–7.7)
Neutrophils Relative %: 65 %
Platelet Count: 191 10*3/uL (ref 150–400)
RBC: 4.61 MIL/uL (ref 3.87–5.11)
RDW: 15.2 % (ref 11.5–15.5)
WBC Count: 4.9 10*3/uL (ref 4.0–10.5)
nRBC: 0 % (ref 0.0–0.2)

## 2021-07-17 LAB — TSH: TSH: 3.247 u[IU]/mL (ref 0.308–3.960)

## 2021-07-17 MED ORDER — SODIUM CHLORIDE 0.9 % IV SOLN: Freq: Once | INTRAVENOUS | Status: AC

## 2021-07-17 MED ORDER — SODIUM CHLORIDE 0.9 % IV SOLN
480.0000 mg | Freq: Once | INTRAVENOUS | Status: AC
  Administered 2021-07-17: 480 mg via INTRAVENOUS
  Filled 2021-07-17: qty 48

## 2021-07-17 MED ORDER — HYDROCODONE BIT-HOMATROP MBR 5-1.5 MG/5ML PO SOLN: 5.0000 mL | Freq: Four times a day (QID) | ORAL | 0 refills | Status: DC | PRN

## 2021-07-17 NOTE — Progress Notes (Signed)
?    [place] ?Telephone:(336) [phone]   [phone] ? ?OFFICE PROGRESS NOTE ? ?[name], [place] ?[address] ?[place] [address] ? ?DIAGNOSIS: Recurrent non-small cell lung cancer initially diagnosed as stage IIIA (T1b, N2, M0) non-small cell lung cancer presented with right middle lobe pulmonary nodule, mediastinal lymphadenopathy and highly suspicious for small nodule in the left upper lobe that could change her stage to stage IV that could present another synchronous primary lesion in the left upper lobe. This was diagnosed in September 2017. ? ?PRIOR THERAPY:  ?1) Concurrent chemoradiation with weekly carboplatin for AUC of 2 and paclitaxel 45 MG/M2 status post 6 cycles last dose was given 02/25/2016 with partial response. ?2) Consolidation chemotherapy with carboplatin for AUC of 5 and paclitaxel 175 MG/M2 every 3 weeks with Neulasta support. First dose 04/22/2016. Status post 3 cycles. ?3) Second line treatment with immunotherapy with Nivolumab 480 mg IV every 4 weeks status post 32 cycles.  Discontinued secondary to intolerance and frequent hospitalization with pneumonia and pneumonitis.  She had a break of treatment between November 24, 2019 until June 28, 2020. ? ?CURRENT THERAPY: Resuming her treatment with immunotherapy with nivolumab 480 mg IV every 4 weeks.  Cycle #33 started on June 28, 2020.  Status post 45 cycles. ? ?INTERVAL HISTORY: ?Nomie Michelotti 71 y.o. female returns to the clinic today for follow-up visit.  The patient is feeling fine today with no concerning complaints except for the baseline shortness of breath and she is currently on home oxygen.  She denied having any chest pain, but has cough with no hemoptysis.  She denied having any nausea, vomiting, diarrhea or constipation.  She has no headache or visual changes but has occasional dizzy spells.  The patient had CT scan of the chest, abdomen pelvis performed recently and she is here for evaluation and  discussion of her scan results ? ?MEDICAL HISTORY: ?Past Medical History:  ?Diagnosis Date  ? Anemia   ? as a young woman  ? Arthritis   ? osteoartritis  ? Asthma   ? Brain tumor (benign) ([place]) 2005 [facility]  ? Benign  ? Chronic headaches   ? Chronic hip pain   ? Chronic pain   ? COPD (chronic obstructive pulmonary disease) ([place])   ? Coronary artery disease   ? Depression   ? Depression 05/15/2016  ? Encounter for antineoplastic chemotherapy 01/10/2016  ? GERD (gastroesophageal reflux disease)   ? Hypertension   ? Lung cancer ([place]) dx'd 01/2016  ? currently on chemo and radiation   ? NSTEMI (non-ST elevated myocardial infarction) ([place]) yrs ago  ? On home O2   ? qhs 2 liters at hs and prn  ? Pneumonia last time 2 yrs ago  ? Shortness of breath dyspnea   ? with activity  ? ? ?ALLERGIES:  is allergic to desyrel [trazodone] and no known allergies. ? ?MEDICATIONS:  ?Current Outpatient Medications  ?Medication Sig Dispense Refill  ? acetaminophen (TYLENOL) 325 MG tablet Take 2 tablets (650 mg total) by mouth every 6 (six) hours as needed for mild pain, fever or headache. 12 tablet 2  ? albuterol (PROVENTIL HFA;VENTOLIN HFA) 108 (90 BASE) MCG/ACT inhaler Inhale 2 puffs into the lungs every 4 (four) hours as needed for shortness of breath.     ? atorvastatin (LIPITOR) 10 MG tablet Take 10 mg by mouth daily.    ? Cholecalciferol (VITAMIN D3) 125 MCG (5000 UT) CAPS Take 1 capsule by mouth daily.  0  ? cyclobenzaprine (FLEXERIL) 10 MG tablet Take 1 tablet (10 mg total) by mouth 2 (two) times daily. *MAy take one additional tablet as needed for muscle spasms (Patient taking differently: Take 10 mg by mouth 2 (two) times daily. *May take one additional tablet as needed for muscle spasms) 60 tablet 2  ? diclofenac sodium (VOLTAREN) 1 % GEL Apply topically 4 (four) times daily as needed (massge gel into affected area(s) as needed for pain).   1  ? DULoxetine (CYMBALTA) 60 MG capsule Cymbalta 60 mg capsule,delayed release ? Take 1  capsule every day by oral route. ? stop 30 mg capsules    ? Dupilumab (DUPIXENT) 300 MG/2ML SOPN Inject 300 mg into the skin every 14 (fourteen) days. Twice a month    ? ENSURE (ENSURE) Take 237 mLs by mouth 3 (three) times daily between meals.    ? HYDROcodone bit-homatropine (HYCODAN) 5-1.5 MG/5ML syrup Take 5 mLs by mouth every 6 (six) hours as needed for cough. 120 mL 0  ? Ipratropium-Albuterol (COMBIVENT RESPIMAT) 20-100 MCG/ACT AERS respimat INHALE 2 PUFFS BY MOUTH FOUR TIMES DAILY AS NEEDED FOR WHEEZING    ? ipratropium-albuterol (DUONEB) 0.5-2.5 (3) MG/3ML SOLN Take 3 mLs by nebulization 3 (three) times daily. 360 mL 2  ? metoprolol tartrate (LOPRESSOR) 25 MG tablet Take 1 tablet (25 mg total) by mouth 2 (two) times daily. 60 tablet 0  ? oxyCODONE-acetaminophen (PERCOCET/ROXICET) 5-325 MG tablet Take 1 tablet by mouth every 8 (eight) hours as needed for moderate pain. 12 tablet 0  ? prochlorperazine (COMPAZINE) 10 MG tablet Take 1 tablet (10 mg total) by mouth every 6 (six) hours as needed for nausea or vomiting. 30 tablet 0  ? topiramate (TOPAMAX) 25 MG tablet Take 1 tablet by mouth daily at bedtime X 1 week; then increase to 25mg  BID. (Patient taking differently: Take 25 mg by mouth 2 (two) times daily.) 45 tablet 2  ? TRELEGY ELLIPTA 100-62.5-25 MCG/ACT AEPB Inhale 1 puff into the lungs daily.    ? Vitamin D, Ergocalciferol, (DRISDOL) 1.25 MG (50000 UNIT) CAPS capsule Take 50,000 Units by mouth once a week.    ? ?No current facility-administered medications for this visit.  ? ? ?SURGICAL HISTORY:  ?Past Surgical History:  ?Procedure Laterality Date  ? CHOLECYSTECTOMY    ? COLONOSCOPY  2015  ? [facility] from [facility]  ? COLONOSCOPY    ? ESOPHAGOGASTRODUODENOSCOPY N/A 08/14/2015  ? Procedure: ESOPHAGOGASTRODUODENOSCOPY (EGD);  Surgeon: Daneil Dolin, MD;  [facility];  Service: Endoscopy;  Laterality: N/A;  215 ?  ? ESOPHAGOGASTRODUODENOSCOPY (EGD) WITH PROPOFOL N/A 09/13/2015   ? Procedure: ESOPHAGOGASTRODUODENOSCOPY (EGD) WITH PROPOFOL;  Surgeon: Milus Banister, MD;  [facility];  Service: Endoscopy;  Laterality: N/A;  ? EUS N/A 03/12/2017  ? Procedure: UPPER ENDOSCOPIC ULTRASOUND (EUS) RADIAL;  Surgeon: Milus Banister, MD;  [facility];  Service: Endoscopy;  Laterality: N/A;  ? TUMOR REMOVAL  2005  ? Benign  ? UPPER ESOPHAGEAL ENDOSCOPIC ULTRASOUND (EUS)  09/13/2015  ? Procedure: UPPER ESOPHAGEAL ENDOSCOPIC ULTRASOUND (EUS);  Surgeon: Milus Banister, MD;  Location: [facility];  Service: Endoscopy;;  ? VIDEO BRONCHOSCOPY WITH ENDOBRONCHIAL NAVIGATION N/A 12/31/2015  ? Procedure: VIDEO BRONCHOSCOPY WITH ENDOBRONCHIAL NAVIGATION;  Surgeon: Melrose Nakayama, MD;  Location: [facility];  Service: Thoracic;  Laterality: N/A;  ? VIDEO BRONCHOSCOPY WITH ENDOBRONCHIAL ULTRASOUND N/A 11/08/2015  ? Procedure: VIDEO BRONCHOSCOPY WITH ENDOBRONCHIAL ULTRASOUND;  Surgeon: Ivin Poot, MD;  [facility];  Service: Thoracic;  Laterality: N/A;  ? ? ?REVIEW OF SYSTEMS:  Constitutional: positive for fatigue ?Eyes: negative ?Ears, nose, mouth, throat, and face: negative ?Respiratory: positive for dyspnea on exertion ?Cardiovascular: negative ?Gastrointestinal: negative ?Genitourinary:negative ?Integument/breast: negative ?Hematologic/lymphatic: negative ?Musculoskeletal:negative ?Neurological: negative ?Behavioral/Psych: negative ?Endocrine: negative ?Allergic/Immunologic: negative  ? ?PHYSICAL EXAMINATION: General appearance: alert, cooperative, fatigued, and no distress ?Head: Normocephalic, without obvious abnormality, atraumatic ?Neck: no adenopathy, no JVD, supple, symmetrical, trachea midline, and thyroid not enlarged, symmetric, no tenderness/mass/nodules ?Lymph nodes: Cervical, supraclavicular, and axillary nodes normal. ?Resp: clear to auscultation bilaterally ?Back: symmetric, no curvature. ROM normal. No CVA tenderness. ?Cardio: regular rate and rhythm, S1, S2 normal,  no murmur, click, rub or gallop ?GI: soft, non-tender; bowel sounds normal; no masses,  no organomegaly ?Extremities: extremities normal, atraumatic, no cyanosis or edema ?Neurologic: Alert and oriented

## 2021-07-17 NOTE — Progress Notes (Signed)
Per Dr. Julien Nordmann ,it is okay to treat pt today  with Nivolumab and heart rate of 105. ?

## 2021-07-17 NOTE — Progress Notes (Signed)
Oncology Nurse Navigator Documentation ? ? ?  07/17/2021  ?  9:00 AM 04/24/2021  ? 11:00 AM 04/09/2017  ?  2:00 PM 02/22/2016  ? 11:00 AM 02/18/2016  ? 11:00 AM 01/30/2016  ? 11:00 AM 01/28/2016  ? 11:00 AM  ?Oncology Nurse Navigator Flowsheets  ?Navigator Location [facility]  ?Navigator Encounter Type Clinic/MDC Clinic/MDC Telephone Treatment Treatment Other Clinic/MDC  ?Telephone   Outgoing Call      ?Patient Visit Type MedOnc MedOnc   MedOnc  MedOnc  ?Treatment Phase Treatment Treatment Follow-up Treatment Treatment Treatment Treatment  ?Barriers/Navigation Needs  Education Event organiser of Care Transportation  ?Education  Other Other      ?Interventions Psycho-Social Support Education;Psycho-Social Support Coordination of Care;Education Other Other Coordination of Care Transportations  ?Acuity  Level 2-Minimal Needs (1-2 Barriers Identified) Level 2 Level 2 Level 2 Level 2 Level 2  ?Coordination of Care   Appts   Appts Other  ?Time Spent with Patient 06 [phone]  ?  ?

## 2021-07-17 NOTE — Patient Instructions (Signed)
[facility]  Discharge Instructions: ?Thank you for choosing [facility] to provide your oncology and hematology care.  ? ?If you have a lab appointment with the Cancer Center, please go directly to the Cancer Center and check in at the registration area. ?  ?Wear comfortable clothing and clothing appropriate for easy access to any Portacath or PICC line.  ? ?We strive to give you quality time with your provider. You may need to reschedule your appointment if you arrive late (15 or more minutes).  Arriving late affects you and other patients whose appointments are after yours.  Also, if you miss three or more appointments without notifying the office, you may be dismissed from the clinic at the provider?s discretion.    ?  ?For prescription refill requests, have your pharmacy contact our office and allow 72 hours for refills to be completed.   ? ?Today you received the following chemotherapy and/or immunotherapy agents Opdivo    ?  ?To help prevent nausea and vomiting after your treatment, we encourage you to take your nausea medication as directed. ? ?BELOW ARE SYMPTOMS THAT SHOULD BE REPORTED IMMEDIATELY: ?*FEVER GREATER THAN 100.4 F (38 ?C) OR HIGHER ?*CHILLS OR SWEATING ?*NAUSEA AND VOMITING THAT IS NOT CONTROLLED WITH YOUR NAUSEA MEDICATION ?*UNUSUAL SHORTNESS OF BREATH ?*UNUSUAL BRUISING OR BLEEDING ?*URINARY PROBLEMS (pain or burning when urinating, or frequent urination) ?*BOWEL PROBLEMS (unusual diarrhea, constipation, pain near the anus) ?TENDERNESS IN MOUTH AND THROAT WITH OR WITHOUT PRESENCE OF ULCERS (sore throat, sores in mouth, or a toothache) ?UNUSUAL RASH, SWELLING OR PAIN  ?UNUSUAL VAGINAL DISCHARGE OR ITCHING  ? ?Items with * indicate a potential emergency and should be followed up as soon as possible or go to the Emergency Department if any problems should occur. ? ?Please show the CHEMOTHERAPY ALERT CARD or IMMUNOTHERAPY ALERT CARD at check-in to the  Emergency Department and triage nurse. ? ?Should you have questions after your visit or need to cancel or reschedule your appointment, please contact [facility]  Dept: [phone]  and follow the prompts.  Office hours are 8:00 a.m. to 4:30 p.m. Monday - Friday. Please note that voicemails left after 4:00 p.m. may not be returned until the following business day.  We are closed weekends and major holidays. You have access to a nurse at all times for urgent questions. Please call the main number to the clinic Dept: [phone] and follow the prompts. ? ? ?For any non-urgent questions, you may also contact your provider using MyChart. We now offer e-Visits for anyone 18 and older to request care online for non-urgent symptoms. For details visit [email or web address]. ?  ?Also download the MyChart app! Go to the app store, search "MyChart", open the app, select , and log in with your MyChart username and password. ? ?Due to Covid, a mask is required upon entering the hospital/clinic. If you do not have a mask, one will be given to you upon arrival. For doctor visits, patients may have 1 support person aged 18 or older with them. For treatment visits, patients cannot have anyone with them due to current Covid guidelines and our immunocompromised population.  ? ?

## 2021-08-02 ENCOUNTER — Other Ambulatory Visit: Payer: Self-pay | Admitting: Internal Medicine

## 2021-08-05 ENCOUNTER — Other Ambulatory Visit (HOSPITAL_COMMUNITY): Payer: Self-pay | Admitting: Family Medicine

## 2021-08-05 ENCOUNTER — Ambulatory Visit (HOSPITAL_COMMUNITY)
Admission: RE | Admit: 2021-08-05 | Discharge: 2021-08-05 | Disposition: A | Discharge status: Home / Self Care | Payer: Medicare Other | Source: Ambulatory Visit | Attending: Family Medicine | Admitting: Family Medicine

## 2021-08-05 DIAGNOSIS — J189 Pneumonia, unspecified organism: Secondary | ICD-10-CM

## 2021-08-06 ENCOUNTER — Other Ambulatory Visit (HOSPITAL_COMMUNITY): Payer: Self-pay | Admitting: Family Medicine

## 2021-08-06 DIAGNOSIS — J189 Pneumonia, unspecified organism: Secondary | ICD-10-CM

## 2021-08-12 NOTE — Progress Notes (Deleted)
[facility] OFFICE PROGRESS NOTE  Courtney Zuniga, [place] [address]  DIAGNOSIS: Recurrent non-small cell lung cancer initially diagnosed as stage IIIA (T1b, N2, M0) non-small cell lung cancer presented with right middle lobe pulmonary nodule, mediastinal lymphadenopathy and highly suspicious for small nodule in the left upper lobe that could change her stage to stage IV that could present another synchronous primary lesion in the left upper lobe. This was diagnosed in September 2017.  PRIOR THERAPY: 1) Concurrent chemoradiation with weekly carboplatin for AUC of 2 and paclitaxel 45 MG/M2 status post 6 cycles last dose was given 02/25/2016 with partial response. 2) Consolidation chemotherapy with carboplatin for AUC of 5 and paclitaxel 175 MG/M2 every 3 weeks with Neulasta support. First dose 04/22/2016. Status post 3 cycles. 3) Second line treatment with immunotherapy with Nivolumab 480 mg IV every 4 weeks status post 32 cycles.  Discontinued secondary to intolerance and frequent hospitalization with pneumonia and pneumonitis.  She had a break of treatment between November 24, 2019 until June 28, 2020.  CURRENT THERAPY: Resuming her treatment with immunotherapy with nivolumab 480 mg IV every 4 weeks.  Cycle #33 started on June 28, 2020.  Status post 46 cycles.  INTERVAL HISTORY: Courtney Zuniga 71 y.o. female returns to the clinic today for a follow up visit. The patient is feeling fairly well today without any concerning complaints except her baseline dyspnea on exertion for which she is on supplemental oxygen.  She also was endorsing cough and was given a prescription for Hycodan which has ***her cough.  She denies any fever, chills, or night sweats.  She has been using her inhalers as prescribed. She reports some fatigue and a decreased appetite. She states she stays very hydrated drinking water but can only eat small amounts. Her only pain aside from the  headaches is her chronic hip/leg pain for which she takes Percocet as needed. The patient continues to tolerate treatment with immunotherapy well without any adverse effects. Denies any fever, chills, night sweats, or weight loss. Denies any chest pain or hemoptysis. Denies any nausea, vomiting, diarrhea, or constipation. Denies any rashes or skin changes. The patient is here today for evaluation prior to starting cycle # 10   She has had chronic headaches since having a brain tumor removed in 2005.  Neurology?  MEDICAL HISTORY: Past Medical History:  Diagnosis Date   Anemia    as a young woman   Arthritis    osteoartritis   Asthma    Brain tumor (benign) ([facility]) 2005 [facility]   Benign   Chronic headaches    Chronic hip pain    Chronic pain    COPD (chronic obstructive pulmonary disease) (HCC)    Coronary artery disease    Depression    Depression 05/15/2016   Encounter for antineoplastic chemotherapy 01/10/2016   GERD (gastroesophageal reflux disease)    Hypertension    Lung cancer ([facility]) dx'd 01/2016   currently on chemo and radiation    NSTEMI (non-ST elevated myocardial infarction) ([facility]) yrs ago   On home O2    qhs 2 liters at hs and prn   Pneumonia last time 2 yrs ago   Shortness of breath dyspnea    with activity    ALLERGIES:  is allergic to desyrel [trazodone] and no known allergies.  MEDICATIONS:  Current Outpatient Medications  Medication Sig Dispense Refill   acetaminophen (TYLENOL) 325 MG tablet Take 2 tablets (650 mg total) by mouth every  6 (six) hours as needed for mild pain, fever or headache. 12 tablet 2   albuterol (PROVENTIL HFA;VENTOLIN HFA) 108 (90 BASE) MCG/ACT inhaler Inhale 2 puffs into the lungs every 4 (four) hours as needed for shortness of breath.      atorvastatin (LIPITOR) 10 MG tablet Take 10 mg by mouth daily.     Cholecalciferol (VITAMIN D3) 125 MCG (5000 UT) CAPS Take 1 capsule by mouth daily.  0   cyclobenzaprine (FLEXERIL) 10 MG tablet Take 1  tablet (10 mg total) by mouth 2 (two) times daily. *MAy take one additional tablet as needed for muscle spasms (Patient taking differently: Take 10 mg by mouth 2 (two) times daily. *May take one additional tablet as needed for muscle spasms) 60 tablet 2   diclofenac sodium (VOLTAREN) 1 % GEL Apply topically 4 (four) times daily as needed (massge gel into affected area(s) as needed for pain).   1   DULoxetine (CYMBALTA) 60 MG capsule Cymbalta 60 mg capsule,delayed release  Take 1 capsule every day by oral route.  stop 30 mg capsules     Dupilumab (DUPIXENT) 300 MG/2ML SOPN Inject 300 mg into the skin every 14 (fourteen) days. Twice a month     ENSURE (ENSURE) Take 237 mLs by mouth 3 (three) times daily between meals.     HYDROcodone bit-homatropine (HYCODAN) 5-1.5 MG/5ML syrup Take 5 mLs by mouth every 6 (six) hours as needed for cough. 120 mL 0   Ipratropium-Albuterol (COMBIVENT RESPIMAT) 20-100 MCG/ACT AERS respimat INHALE 2 PUFFS BY MOUTH FOUR TIMES DAILY AS NEEDED FOR WHEEZING     ipratropium-albuterol (DUONEB) 0.5-2.5 (3) MG/3ML SOLN Take 3 mLs by nebulization 3 (three) times daily. 360 mL 2   metoprolol tartrate (LOPRESSOR) 25 MG tablet Take 1 tablet (25 mg total) by mouth 2 (two) times daily. 60 tablet 0   oxyCODONE-acetaminophen (PERCOCET/ROXICET) 5-325 MG tablet Take 1 tablet by mouth every 8 (eight) hours as needed for moderate pain. 12 tablet 0   prochlorperazine (COMPAZINE) 10 MG tablet Take 1 tablet (10 mg total) by mouth every 6 (six) hours as needed for nausea or vomiting. 30 tablet 0   topiramate (TOPAMAX) 25 MG tablet Take 1 tablet by mouth daily at bedtime X 1 week; then increase to 25mg  BID. (Patient taking differently: Take 25 mg by mouth 2 (two) times daily.) 45 tablet 2   TRELEGY ELLIPTA 100-62.5-25 MCG/ACT AEPB Inhale 1 puff into the lungs daily.     Vitamin D, Ergocalciferol, (DRISDOL) 1.25 MG (50000 UNIT) CAPS capsule Take 50,000 Units by mouth once a week.     No current  facility-administered medications for this visit.    SURGICAL HISTORY:  Past Surgical History:  Procedure Laterality Date   CHOLECYSTECTOMY     COLONOSCOPY  2015   Results requested from [facility]   COLONOSCOPY     ESOPHAGOGASTRODUODENOSCOPY N/A 08/14/2015   Procedure: ESOPHAGOGASTRODUODENOSCOPY (EGD);  Surgeon: Daneil Dolin, MD;  Location: [facility];  Service: Endoscopy;  Laterality: N/A;  215    ESOPHAGOGASTRODUODENOSCOPY (EGD) WITH PROPOFOL N/A 09/13/2015   Procedure: ESOPHAGOGASTRODUODENOSCOPY (EGD) WITH PROPOFOL;  Surgeon: Milus Banister, MD;  Location: [facility];  Service: Endoscopy;  Laterality: N/A;   EUS N/A 03/12/2017   Procedure: UPPER ENDOSCOPIC ULTRASOUND (EUS) RADIAL;  Surgeon: Milus Banister, MD;  Location: [facility];  Service: Endoscopy;  Laterality: N/A;   TUMOR REMOVAL  2005   Benign   UPPER ESOPHAGEAL ENDOSCOPIC ULTRASOUND (EUS)  09/13/2015  Procedure: UPPER ESOPHAGEAL ENDOSCOPIC ULTRASOUND (EUS);  Surgeon: Milus Banister, MD;  Location: [facility];  Service: Endoscopy;;   VIDEO BRONCHOSCOPY WITH ENDOBRONCHIAL NAVIGATION N/A 12/31/2015   Procedure: VIDEO BRONCHOSCOPY WITH ENDOBRONCHIAL NAVIGATION;  Surgeon: Melrose Nakayama, MD;  Location: [facility];  Service: Thoracic;  Laterality: N/A;   VIDEO BRONCHOSCOPY WITH ENDOBRONCHIAL ULTRASOUND N/A 11/08/2015   Procedure: VIDEO BRONCHOSCOPY WITH ENDOBRONCHIAL ULTRASOUND;  Surgeon: Ivin Poot, MD;  Location: [facility];  Service: Thoracic;  Laterality: N/A;    REVIEW OF SYSTEMS:   Review of Systems  Constitutional: Negative for appetite change, chills, fatigue, fever and unexpected weight change.  HENT:   Negative for mouth sores, nosebleeds, sore throat and trouble swallowing.   Eyes: Negative for eye problems and icterus.  Respiratory: Negative for cough, hemoptysis, shortness of breath and wheezing.   Cardiovascular: Negative for chest pain and leg swelling.  Gastrointestinal: Negative for  abdominal pain, constipation, diarrhea, nausea and vomiting.  Genitourinary: Negative for bladder incontinence, difficulty urinating, dysuria, frequency and hematuria.   Musculoskeletal: Negative for back pain, gait problem, neck pain and neck stiffness.  Skin: Negative for itching and rash.  Neurological: Negative for dizziness, extremity weakness, gait problem, headaches, light-headedness and seizures.  Hematological: Negative for adenopathy. Does not bruise/bleed easily.  Psychiatric/Behavioral: Negative for confusion, depression and sleep disturbance. The patient is not nervous/anxious.     PHYSICAL EXAMINATION:  There were no vitals taken for this visit.  ECOG PERFORMANCE STATUS: {CHL ONC ECOG [id number]  Physical Exam  Constitutional: Oriented to person, place, and time and well-developed, well-nourished, and in no distress. No distress.  HENT:  Head: Normocephalic and atraumatic.  Mouth/Throat: Oropharynx is clear and moist. No oropharyngeal exudate.  Eyes: Conjunctivae are normal. Right eye exhibits no discharge. Left eye exhibits no discharge. No scleral icterus.  Neck: Normal range of motion. Neck supple.  Cardiovascular: Normal rate, regular rhythm, normal heart sounds and intact distal pulses.   Pulmonary/Chest: Effort normal and breath sounds normal. No respiratory distress. No wheezes. No rales.  Abdominal: Soft. Bowel sounds are normal. Exhibits no distension and no mass. There is no tenderness.  Musculoskeletal: Normal range of motion. Exhibits no edema.  Lymphadenopathy:    No cervical adenopathy.  Neurological: Alert and oriented to person, place, and time. Exhibits normal muscle tone. Gait normal. Coordination normal.  Skin: Skin is warm and dry. No rash noted. Not diaphoretic. No erythema. No pallor.  Psychiatric: Mood, memory and judgment normal.  Vitals reviewed.  LABORATORY DATA: Lab Results  Component Value Date   WBC 4.9 07/17/2021   HGB 13.5  07/17/2021   HCT 39.7 07/17/2021   MCV 86.1 07/17/2021   PLT 191 07/17/2021      Chemistry      Component Value Date/Time   NA 144 07/17/2021 0832   NA 144 04/09/2017 0948   K 3.7 07/17/2021 0832   K 4.3 04/09/2017 0948   CL 106 07/17/2021 0832   CO2 31 07/17/2021 0832   CO2 25 04/09/2017 0948   BUN 16 07/17/2021 0832   BUN 16.8 04/09/2017 0948   CREATININE 0.69 07/17/2021 0832   CREATININE 0.8 04/09/2017 0948      Component Value Date/Time   CALCIUM 9.2 07/17/2021 0832   CALCIUM 9.1 04/09/2017 0948   ALKPHOS 291 (H) 07/17/2021 0832   ALKPHOS 216 (H) 04/09/2017 0948   AST 18 07/17/2021 0832   AST 12 04/09/2017 0948   ALT 8 07/17/2021 0832   ALT 13 04/09/2017 0948  BILITOT 0.1 (L) 07/17/2021 0832   BILITOT 0.22 04/09/2017 0948       RADIOGRAPHIC STUDIES:  DG Chest 2 View  Result Date: 08/06/2021 CLINICAL DATA:  71 year old female with a history of Pneumonia due to infectious organism, unspecified laterality, unspecified part of lung EXAM: CHEST - 2 VIEW COMPARISON:  08/08/2020, CT chest 07/15/2021 FINDINGS: Cardiomediastinal silhouette unchanged in size and contour. Stigmata of emphysema, with increased retrosternal airspace, flattened hemidiaphragms, increased AP diameter, and hyperinflation on the AP view. Architectural distortion in the region the right middle lobe and at the right lung apex, better characterized on recent CT, compatible with post treatment changes. Linear opacities at the lung bases. Overall improved aeration compared to the prior plain film. No new pneumothorax pleural effusion or confluent airspace disease. Diffusely coarsened interstitial markings throughout. IMPRESSION: Post treatment affects with background emphysema and no definite evidence of acute cardiopulmonary disease Electronically Signed   By: Corrie Mckusick D.O.   On: 08/06/2021 10:35   CT Chest W Contrast  Result Date: 07/15/2021 CLINICAL DATA:  Non-small cell lung cancer restaging,  ongoing therapy * Tracking Code: BO * EXAM: CT CHEST, ABDOMEN, AND PELVIS WITH CONTRAST TECHNIQUE: Multidetector CT imaging of the chest, abdomen and pelvis was performed following the standard protocol during bolus administration of intravenous contrast. RADIATION DOSE REDUCTION: This exam was performed according to the departmental dose-optimization program which includes automated exposure control, adjustment of the mA and/or kV according to patient size and/or use of iterative reconstruction technique. CONTRAST:  145mL OMNIPAQUE IOHEXOL 300 MG/ML SOLN, additional oral enteric contrast COMPARISON:  CT chest abdomen pelvis, 03/15/2021, PET-CT, 12/28/2020 FINDINGS: CT CHEST FINDINGS Cardiovascular: Aortic atherosclerosis. Normal heart size. Left and right coronary artery calcifications. Enlargement of the main pulmonary artery measuring up to 3.6 cm in caliber. No pericardial effusion. Mediastinum/Nodes: No enlarged mediastinal, hilar, or axillary lymph nodes. Thyroid gland, trachea, and esophagus demonstrate no significant findings. Lungs/Pleura: Severe centrilobular and paraseptal emphysema. Unchanged spiculated mass of the central right upper lobe, measuring 2.6 x 1.6 cm (series 4, image 30). Unchanged fibrotic scarring and consolidation of the perihilar right lower lobe and right middle lobe (series 4, image 89). Additional bandlike scarring of the posterior lingula. The no pleural effusion or pneumothorax. Musculoskeletal: No chest wall mass or suspicious osseous lesions identified. CT ABDOMEN PELVIS FINDINGS Hepatobiliary: No focal liver abnormality is seen. Status post cholecystectomy. Postoperative biliary dilatation. Pancreas: Unremarkable. No pancreatic ductal dilatation or surrounding inflammatory changes. Spleen: Normal in size without significant abnormality. Adrenals/Urinary Tract: Adrenal glands are unremarkable. Kidneys are normal, without renal calculi, solid lesion, or hydronephrosis. Bladder is  unremarkable. Stomach/Bowel: Stomach is within normal limits. Appendix appears normal. No evidence of bowel wall thickening, distention, or inflammatory changes. Descending and sigmoid diverticulosis. Vascular/Lymphatic: Aortic atherosclerosis. No enlarged abdominal or pelvic lymph nodes. Reproductive: Calcified uterine fibroids. Other: No abdominal wall hernia or abnormality. No ascites. Musculoskeletal: No acute osseous findings. Unchanged benign, trabeculated vertebral body hemangiomata of T12 and L5. IMPRESSION: 1. Unchanged post treatment appearance of the right chest, with spiculated mass of the central right upper lobe and fibrotic scarring and consolidation of the perihilar right lung. 2. No evidence of lymphadenopathy or metastatic disease in the chest, abdomen, or pelvis. 3. Severe emphysema. 4. Coronary artery disease. 5. Enlargement of the main pulmonary artery, as can be seen in pulmonary hypertension. Aortic Atherosclerosis (ICD10-I70.0) and Emphysema (ICD10-J43.9). Electronically Signed   By: Delanna Ahmadi M.D.   On: 07/15/2021 14:21   CT Abdomen Pelvis W  Contrast  Result Date: 07/15/2021 CLINICAL DATA:  Non-small cell lung cancer restaging, ongoing therapy * Tracking Code: BO * EXAM: CT CHEST, ABDOMEN, AND PELVIS WITH CONTRAST TECHNIQUE: Multidetector CT imaging of the chest, abdomen and pelvis was performed following the standard protocol during bolus administration of intravenous contrast. RADIATION DOSE REDUCTION: This exam was performed according to the departmental dose-optimization program which includes automated exposure control, adjustment of the mA and/or kV according to patient size and/or use of iterative reconstruction technique. CONTRAST:  119mL OMNIPAQUE IOHEXOL 300 MG/ML SOLN, additional oral enteric contrast COMPARISON:  CT chest abdomen pelvis, 03/15/2021, PET-CT, 12/28/2020 FINDINGS: CT CHEST FINDINGS Cardiovascular: Aortic atherosclerosis. Normal heart size. Left and right  coronary artery calcifications. Enlargement of the main pulmonary artery measuring up to 3.6 cm in caliber. No pericardial effusion. Mediastinum/Nodes: No enlarged mediastinal, hilar, or axillary lymph nodes. Thyroid gland, trachea, and esophagus demonstrate no significant findings. Lungs/Pleura: Severe centrilobular and paraseptal emphysema. Unchanged spiculated mass of the central right upper lobe, measuring 2.6 x 1.6 cm (series 4, image 30). Unchanged fibrotic scarring and consolidation of the perihilar right lower lobe and right middle lobe (series 4, image 89). Additional bandlike scarring of the posterior lingula. The no pleural effusion or pneumothorax. Musculoskeletal: No chest wall mass or suspicious osseous lesions identified. CT ABDOMEN PELVIS FINDINGS Hepatobiliary: No focal liver abnormality is seen. Status post cholecystectomy. Postoperative biliary dilatation. Pancreas: Unremarkable. No pancreatic ductal dilatation or surrounding inflammatory changes. Spleen: Normal in size without significant abnormality. Adrenals/Urinary Tract: Adrenal glands are unremarkable. Kidneys are normal, without renal calculi, solid lesion, or hydronephrosis. Bladder is unremarkable. Stomach/Bowel: Stomach is within normal limits. Appendix appears normal. No evidence of bowel wall thickening, distention, or inflammatory changes. Descending and sigmoid diverticulosis. Vascular/Lymphatic: Aortic atherosclerosis. No enlarged abdominal or pelvic lymph nodes. Reproductive: Calcified uterine fibroids. Other: No abdominal wall hernia or abnormality. No ascites. Musculoskeletal: No acute osseous findings. Unchanged benign, trabeculated vertebral body hemangiomata of T12 and L5. IMPRESSION: 1. Unchanged post treatment appearance of the right chest, with spiculated mass of the central right upper lobe and fibrotic scarring and consolidation of the perihilar right lung. 2. No evidence of lymphadenopathy or metastatic disease in the  chest, abdomen, or pelvis. 3. Severe emphysema. 4. Coronary artery disease. 5. Enlargement of the main pulmonary artery, as can be seen in pulmonary hypertension. Aortic Atherosclerosis (ICD10-I70.0) and Emphysema (ICD10-J43.9). Electronically Signed   By: Delanna Ahmadi M.D.   On: 07/15/2021 14:21     ASSESSMENT/PLAN:  This is a very pleasant 71 year old African-American female with recurrent metastatic non-small cell lung cancer, adenocarcinoma of the right middle lobe.  She was initially diagnosed as stage IIIa in September 2017.     She completed concurrent chemoradiation followed by consolidation with carboplatin and paclitaxel. She is status post 3 cycles. The patient was on observation but a restaging CT scan showed evidence of disease progression.   She is currently undergoing treatment with second line with immunotherapy with nivolumab 480 mg IV every 4 weeks.  She was status post 31 cycles. Her treatment had been on hold since August 2021 until her COPD can get under control. She showed evidence of some recurrent disease in March 2022 and she resumed her treatment with cycle number cycle #33 of single agent nivolumab 480 mg IV every 4 weeks in March 2022 status post 46 cycles.  The patient was seen with Dr. Julien Nordmann today.  Labs were reviewed.  Recommend that she ***cycle #47 today scheduled.  We will see  her back for follow-up visit in 4 weeks for evaluation and repeat blood work before starting cycle number 48  The patient will continue to use Hycodan if needed for cough.  The patient was advised to call immediately if she has any concerning symptoms in the interval. The patient voices understanding of current disease status and treatment options and is in agreement with the current care plan. All questions were answered. The patient knows to call the clinic with any problems, questions or concerns. We can certainly see the patient much sooner if necessary      No orders of the  defined types were placed in this encounter.    I spent {CHL ONC TIME VISIT - APOLI:[id number]} counseling the patient face to face. The total time spent in the appointment was {CHL ONC TIME VISIT - OILNZ:[id number]}.  Colinda Barth L Shenicka Sunderlin, PA-C 08/12/21

## 2021-08-13 ENCOUNTER — Telehealth: Payer: Self-pay | Admitting: Internal Medicine

## 2021-08-13 ENCOUNTER — Other Ambulatory Visit: Payer: Self-pay | Admitting: Physician Assistant

## 2021-08-13 DIAGNOSIS — C3491 Malignant neoplasm of unspecified part of right bronchus or lung: Secondary | ICD-10-CM

## 2021-08-13 NOTE — Telephone Encounter (Signed)
Called patient regarding rescheduled upcoming appointments, patient is notified ?

## 2021-08-14 ENCOUNTER — Telehealth: Payer: Self-pay

## 2021-08-14 ENCOUNTER — Inpatient Hospital Stay: Payer: Medicare Other | Admitting: Physician Assistant

## 2021-08-14 ENCOUNTER — Inpatient Hospital Stay: Payer: Medicare Other

## 2021-08-14 NOTE — Telephone Encounter (Signed)
Received call from SW with the dept of transportation with [facility]. She stated that she made a mistake and scheduled the patients transportation for Friday 5/5 instead of today. This RN explained that Murray Hodgkins PA has availability on Friday so we will move all appointment to Friday with her first appointment remaining at 9:30. ?

## 2021-08-16 ENCOUNTER — Other Ambulatory Visit: Payer: Self-pay

## 2021-08-16 ENCOUNTER — Inpatient Hospital Stay: Payer: Medicare Other

## 2021-08-16 ENCOUNTER — Inpatient Hospital Stay (HOSPITAL_BASED_OUTPATIENT_CLINIC_OR_DEPARTMENT_OTHER): Payer: Medicare Other | Admitting: Physician Assistant

## 2021-08-16 ENCOUNTER — Inpatient Hospital Stay: Payer: Medicare Other | Attending: Physician Assistant

## 2021-08-16 VITALS — BP 122/77 | HR 109 | Temp 97.7°F | Resp 18 | Wt 137.4 lb

## 2021-08-16 VITALS — HR 108

## 2021-08-16 DIAGNOSIS — Z5112 Encounter for antineoplastic immunotherapy: Secondary | ICD-10-CM

## 2021-08-16 DIAGNOSIS — C342 Malignant neoplasm of middle lobe, bronchus or lung: Secondary | ICD-10-CM | POA: Diagnosis present

## 2021-08-16 DIAGNOSIS — Z79899 Other long term (current) drug therapy: Secondary | ICD-10-CM | POA: Insufficient documentation

## 2021-08-16 DIAGNOSIS — C3491 Malignant neoplasm of unspecified part of right bronchus or lung: Secondary | ICD-10-CM

## 2021-08-16 LAB — CBC WITH DIFFERENTIAL (CANCER CENTER ONLY)
Abs Immature Granulocytes: 0.02 10*3/uL (ref 0.00–0.07)
Basophils Absolute: 0 10*3/uL (ref 0.0–0.1)
Basophils Relative: 0 %
Eosinophils Absolute: 0.1 10*3/uL (ref 0.0–0.5)
Eosinophils Relative: 1 %
HCT: 42.2 % (ref 36.0–46.0)
Hemoglobin: 14.3 g/dL (ref 12.0–15.0)
Immature Granulocytes: 0 %
Lymphocytes Relative: 17 %
Lymphs Abs: 1 10*3/uL (ref 0.7–4.0)
MCH: 28.7 pg (ref 26.0–34.0)
MCHC: 33.9 g/dL (ref 30.0–36.0)
MCV: 84.6 fL (ref 80.0–100.0)
Monocytes Absolute: 0.3 10*3/uL (ref 0.1–1.0)
Monocytes Relative: 5 %
Neutro Abs: 4.3 10*3/uL (ref 1.7–7.7)
Neutrophils Relative %: 77 %
Platelet Count: 241 10*3/uL (ref 150–400)
RBC: 4.99 MIL/uL (ref 3.87–5.11)
RDW: 15.4 % (ref 11.5–15.5)
WBC Count: 5.7 10*3/uL (ref 4.0–10.5)
nRBC: 0 % (ref 0.0–0.2)

## 2021-08-16 LAB — CMP (CANCER CENTER ONLY)
ALT: 7 U/L (ref 0–44)
AST: 17 U/L (ref 15–41)
Albumin: 4 g/dL (ref 3.5–5.0)
Alkaline Phosphatase: 250 U/L — ABNORMAL HIGH (ref 38–126)
Anion gap: 8 (ref 5–15)
BUN: 12 mg/dL (ref 8–23)
CO2: 30 mmol/L (ref 22–32)
Calcium: 9 mg/dL (ref 8.9–10.3)
Chloride: 107 mmol/L (ref 98–111)
Creatinine: 0.66 mg/dL (ref 0.44–1.00)
GFR, Estimated: >60 mL/min (ref >60)
Glucose, Bld: 73 mg/dL (ref 70–99)
Potassium: 3.9 mmol/L (ref 3.5–5.1)
Sodium: 145 mmol/L (ref 135–145)
Total Bilirubin: 0.1 mg/dL — ABNORMAL LOW (ref 0.3–1.2)
Total Protein: 7.3 g/dL (ref 6.5–8.1)

## 2021-08-16 LAB — TSH: TSH: 2.252 u[IU]/mL (ref 0.350–4.500)

## 2021-08-16 MED ORDER — SODIUM CHLORIDE 0.9 % IV SOLN
480.0000 mg | Freq: Once | INTRAVENOUS | Status: AC
  Administered 2021-08-16: 480 mg via INTRAVENOUS
  Filled 2021-08-16: qty 48

## 2021-08-16 MED ORDER — SODIUM CHLORIDE 0.9 % IV SOLN: Freq: Once | INTRAVENOUS | Status: AC

## 2021-08-16 NOTE — Progress Notes (Signed)
?    [facility] ?[phone]   [phone] ? ?OFFICE PROGRESS NOTE ? ?[name], [place] ?[address] ?[place] [address] ? ?DIAGNOSIS: Recurrent non-small cell lung cancer initially diagnosed as stage IIIA (T1b, N2, M0) non-small cell lung cancer presented with right middle lobe pulmonary nodule, mediastinal lymphadenopathy and highly suspicious for small nodule in the left upper lobe that could change her stage to stage IV that could present another synchronous primary lesion in the left upper lobe. This was diagnosed in September 2017. ? ?PRIOR THERAPY:  ?1) Concurrent chemoradiation with weekly carboplatin for AUC of 2 and paclitaxel 45 MG/M2 status post 6 cycles last dose was given 02/25/2016 with partial response. ?2) Consolidation chemotherapy with carboplatin for AUC of 5 and paclitaxel 175 MG/M2 every 3 weeks with Neulasta support. First dose 04/22/2016. Status post 3 cycles. ?3) Second line treatment with immunotherapy with Nivolumab 480 mg IV every 4 weeks status post 32 cycles.  Discontinued secondary to intolerance and frequent hospitalization with pneumonia and pneumonitis.  She had a break of treatment between November 24, 2019 until June 28, 2020. ? ?CURRENT THERAPY: Resuming her treatment with immunotherapy with nivolumab 480 mg IV every 4 weeks.  Cycle #33 started on June 28, 2020.  Status post 46 cycles. ? ?INTERVAL HISTORY: ?[facility] Zuniga 71 y.o. female returns to the clinic today for follow-up visit prior to Cycle 47 of Nivolumab.  [name] reports that she was recently treated for pneumonia with antibiotics last week.  She reports she is feeling much better and her energy levels are back to baseline. She continues to use 3L supplemental oxygen and has baseline shortness of breath and cough.  She denies any GI symptoms including nausea, vomiting, diarrhea or constipation.  She has intermittent episodes of headaches that is stable.  She denies easy  bruising or signs of active bleeding.  She denies fevers, chills, night sweats, chest pain or hemoptysis.  She has no other complaints. ?Rest of the 10 point ROS is below. ? ?MEDICAL HISTORY: ?Past Medical History:  ?Diagnosis Date  ? Anemia   ? as a young woman  ? Arthritis   ? osteoartritis  ? Asthma   ? Brain tumor (benign) ([facility]) 2005 [facility]  ? Benign  ? Chronic headaches   ? Chronic hip pain   ? Chronic pain   ? COPD (chronic obstructive pulmonary disease) ([place])   ? Coronary artery disease   ? Depression   ? Depression 05/15/2016  ? Encounter for antineoplastic chemotherapy 01/10/2016  ? GERD (gastroesophageal reflux disease)   ? Hypertension   ? Lung cancer ([place]) dx'd 01/2016  ? currently on chemo and radiation   ? NSTEMI (non-ST elevated myocardial infarction) ([facility]) yrs ago  ? On home O2   ? qhs 2 liters at hs and prn  ? Pneumonia last time 2 yrs ago  ? Shortness of breath dyspnea   ? with activity  ? ? ?ALLERGIES:  is allergic to desyrel [trazodone] and no known allergies. ? ?MEDICATIONS:  ?Current Outpatient Medications  ?Medication Sig Dispense Refill  ? acetaminophen (TYLENOL) 325 MG tablet Take 2 tablets (650 mg total) by mouth every 6 (six) hours as needed for mild pain, fever or headache. 12 tablet 2  ? albuterol (PROVENTIL HFA;VENTOLIN HFA) 108 (90 BASE) MCG/ACT inhaler Inhale 2 puffs into the lungs every 4 (four) hours as needed for shortness of breath.     ? atorvastatin (LIPITOR) 10 MG tablet Take  10 mg by mouth daily.    ? Cholecalciferol (VITAMIN D3) 125 MCG (5000 UT) CAPS Take 1 capsule by mouth daily.  0  ? cyclobenzaprine (FLEXERIL) 10 MG tablet Take 1 tablet (10 mg total) by mouth 2 (two) times daily. *MAy take one additional tablet as needed for muscle spasms (Patient taking differently: Take 10 mg by mouth 2 (two) times daily. *May take one additional tablet as needed for muscle spasms) 60 tablet 2  ? diclofenac sodium (VOLTAREN) 1 % GEL Apply topically 4 (four) times daily as needed (massge  gel into affected area(s) as needed for pain).   1  ? DULoxetine (CYMBALTA) 60 MG capsule Cymbalta 60 mg capsule,delayed release ? Take 1 capsule every day by oral route. ? stop 30 mg capsules    ? Dupilumab (DUPIXENT) 300 MG/2ML SOPN Inject 300 mg into the skin every 14 (fourteen) days. Twice a month    ? ENSURE (ENSURE) Take 237 mLs by mouth 3 (three) times daily between meals.    ? HYDROcodone bit-homatropine (HYCODAN) 5-1.5 MG/5ML syrup Take 5 mLs by mouth every 6 (six) hours as needed for cough. 120 mL 0  ? Ipratropium-Albuterol (COMBIVENT RESPIMAT) 20-100 MCG/ACT AERS respimat INHALE 2 PUFFS BY MOUTH FOUR TIMES DAILY AS NEEDED FOR WHEEZING    ? ipratropium-albuterol (DUONEB) 0.5-2.5 (3) MG/3ML SOLN Take 3 mLs by nebulization 3 (three) times daily. 360 mL 2  ? oxyCODONE-acetaminophen (PERCOCET/ROXICET) 5-325 MG tablet Take 1 tablet by mouth every 8 (eight) hours as needed for moderate pain. 12 tablet 0  ? prochlorperazine (COMPAZINE) 10 MG tablet Take 1 tablet (10 mg total) by mouth every 6 (six) hours as needed for nausea or vomiting. 30 tablet 0  ? topiramate (TOPAMAX) 25 MG tablet Take 1 tablet by mouth daily at bedtime X 1 week; then increase to 25mg  BID. (Patient taking differently: Take 25 mg by mouth 2 (two) times daily.) 45 tablet 2  ? TRELEGY ELLIPTA 100-62.5-25 MCG/ACT AEPB Inhale 1 puff into the lungs daily.    ? Vitamin D, Ergocalciferol, (DRISDOL) 1.25 MG (50000 UNIT) CAPS capsule Take 50,000 Units by mouth once a week.    ? metoprolol tartrate (LOPRESSOR) 25 MG tablet Take 1 tablet (25 mg total) by mouth 2 (two) times daily. 60 tablet 0  ? ?No current facility-administered medications for this visit.  ? ? ?SURGICAL HISTORY:  ?Past Surgical History:  ?Procedure Laterality Date  ? CHOLECYSTECTOMY    ? COLONOSCOPY  2015  ? [facility] requested from [facility]  ? COLONOSCOPY    ? ESOPHAGOGASTRODUODENOSCOPY N/A 08/14/2015  ? Procedure: ESOPHAGOGASTRODUODENOSCOPY (EGD);  Surgeon: Daneil Dolin,  MD;  [facility];  Service: Endoscopy;  Laterality: N/A;  215 ?  ? ESOPHAGOGASTRODUODENOSCOPY (EGD) WITH PROPOFOL N/A 09/13/2015  ? Procedure: ESOPHAGOGASTRODUODENOSCOPY (EGD) WITH PROPOFOL;  Surgeon: Milus Banister, MD;  Location: [facility];  Service: Endoscopy;  Laterality: N/A;  ? EUS N/A 03/12/2017  ? Procedure: UPPER ENDOSCOPIC ULTRASOUND (EUS) RADIAL;  Surgeon: Milus Banister, MD;  Location: [facility];  Service: Endoscopy;  Laterality: N/A;  ? TUMOR REMOVAL  2005  ? Benign  ? UPPER ESOPHAGEAL ENDOSCOPIC ULTRASOUND (EUS)  09/13/2015  ? Procedure: UPPER ESOPHAGEAL ENDOSCOPIC ULTRASOUND (EUS);  Surgeon: Milus Banister, MD;  Location: [facility];  Service: Endoscopy;;  ? VIDEO BRONCHOSCOPY WITH ENDOBRONCHIAL NAVIGATION N/A 12/31/2015  ? Procedure: VIDEO BRONCHOSCOPY WITH ENDOBRONCHIAL NAVIGATION;  Surgeon: Melrose Nakayama, MD;  Location: [facility];  Service: Thoracic;  Laterality:  N/A;  ? VIDEO BRONCHOSCOPY WITH ENDOBRONCHIAL ULTRASOUND N/A 11/08/2015  ? Procedure: VIDEO BRONCHOSCOPY WITH ENDOBRONCHIAL ULTRASOUND;  Surgeon: Ivin Poot, MD;  Location[facility];  Service: Thoracic;  Laterality: N/A;  ? ? ?REVIEW OF SYSTEMS:  ?Constitutional: Negative for appetite change, fatigue, chills, fever and unexpected weight change ?HENT: Negative for mouth sores, nosebleeds, sore throat and trouble swallowing.   ?Eyes: Negative for eye problems and icterus.  ?Respiratory: Negative for hemoptysis and wheezing.  +Stable shortness of breath and chronic cough.  ?Cardiovascular: Negative for chest pain and leg swelling.  ?Gastrointestinal: Negative for abdominal pain, constipation, diarrhea, nausea and vomiting.  ?Skin:Negative for rash and ulcers ?Neurological: Negative for dizziness, extremity weakness, gait problem, headaches, light-headedness and seizures.  ?Hematological: Negative for adenopathy. Does not bruise/bleed easily.  ?Psychiatric/Behavioral: Negative for confusion, depression and sleep  disturbance. The patient is not nervous/anxious.   ? ? ?PHYSICAL EXAMINATION:  ?Constitutional: Oriented to person, place, and time and well-developed, well-nourished, and in no distress.  ?HENT:  ?Head: [place]

## 2021-08-16 NOTE — Progress Notes (Signed)
Per Dede Query, PA, ok to treat with elevated heart rate.  ?

## 2021-08-16 NOTE — Patient Instructions (Signed)
[facility]  ? Discharge Instructions: ?Thank you for choosing [facility] to provide your oncology and hematology care.  ? ?If you have a lab appointment with the Cancer Center, please go directly to the Cancer Center and check in at the registration area. ?  ?Wear comfortable clothing and clothing appropriate for easy access to any Portacath or PICC line.  ? ?We strive to give you quality time with your provider. You may need to reschedule your appointment if you arrive late (15 or more minutes).  Arriving late affects you and other patients whose appointments are after yours.  Also, if you miss three or more appointments without notifying the office, you may be dismissed from the clinic at the provider?s discretion.    ?  ?For prescription refill requests, have your pharmacy contact our office and allow 72 hours for refills to be completed.   ? ?Today you received the following chemotherapy and/or immunotherapy agents: nivolumab    ?  ?To help prevent nausea and vomiting after your treatment, we encourage you to take your nausea medication as directed. ? ?BELOW ARE SYMPTOMS THAT SHOULD BE REPORTED IMMEDIATELY: ?*FEVER GREATER THAN 100.4 F (38 ?C) OR HIGHER ?*CHILLS OR SWEATING ?*NAUSEA AND VOMITING THAT IS NOT CONTROLLED WITH YOUR NAUSEA MEDICATION ?*UNUSUAL SHORTNESS OF BREATH ?*UNUSUAL BRUISING OR BLEEDING ?*URINARY PROBLEMS (pain or burning when urinating, or frequent urination) ?*BOWEL PROBLEMS (unusual diarrhea, constipation, pain near the anus) ?TENDERNESS IN MOUTH AND THROAT WITH OR WITHOUT PRESENCE OF ULCERS (sore throat, sores in mouth, or a toothache) ?UNUSUAL RASH, SWELLING OR PAIN  ?UNUSUAL VAGINAL DISCHARGE OR ITCHING  ? ?Items with * indicate a potential emergency and should be followed up as soon as possible or go to the Emergency Department if any problems should occur. ? ?Please show the CHEMOTHERAPY ALERT CARD or IMMUNOTHERAPY ALERT CARD at check-in  to the Emergency Department and triage nurse. ? ?Should you have questions after your visit or need to cancel or reschedule your appointment, please contact [facility]  Dept: [phone]  and follow the prompts.  Office hours are 8:00 a.m. to 4:30 p.m. Monday - Friday. Please note that voicemails left after 4:00 p.m. may not be returned until the following business day.  We are closed weekends and major holidays. You have access to a nurse at all times for urgent questions. Please call the main number to the clinic Dept: [phone] and follow the prompts. ? ? ?For any non-urgent questions, you may also contact your provider using MyChart. We now offer e-Visits for anyone 18 and older to request care online for non-urgent symptoms. For details visit [email or web address]. ?  ?Also download the MyChart app! Go to the app store, search "MyChart", open the app, select [facility], and log in with your MyChart username and password. ? ?Due to Covid, a mask is required upon entering the hospital/clinic. If you do not have a mask, one will be given to you upon arrival. For doctor visits, patients may have 1 support person aged 18 or older with them. For treatment visits, patients cannot have anyone with them due to current Covid guidelines and our immunocompromised population.  ? ?

## 2021-09-10 ENCOUNTER — Other Ambulatory Visit: Payer: Self-pay | Admitting: Physician Assistant

## 2021-09-10 DIAGNOSIS — C3490 Malignant neoplasm of unspecified part of unspecified bronchus or lung: Secondary | ICD-10-CM

## 2021-09-10 DIAGNOSIS — C3491 Malignant neoplasm of unspecified part of right bronchus or lung: Secondary | ICD-10-CM

## 2021-09-11 ENCOUNTER — Other Ambulatory Visit: Payer: Self-pay

## 2021-09-11 ENCOUNTER — Inpatient Hospital Stay: Payer: Medicare Other

## 2021-09-11 ENCOUNTER — Inpatient Hospital Stay (HOSPITAL_BASED_OUTPATIENT_CLINIC_OR_DEPARTMENT_OTHER): Payer: Medicare Other | Admitting: Internal Medicine

## 2021-09-11 VITALS — BP 113/79 | HR 99 | Resp 18

## 2021-09-11 VITALS — BP 108/79 | HR 109 | Temp 97.7°F | Resp 18 | Ht 66.0 in | Wt 133.9 lb

## 2021-09-11 DIAGNOSIS — C3490 Malignant neoplasm of unspecified part of unspecified bronchus or lung: Secondary | ICD-10-CM

## 2021-09-11 DIAGNOSIS — Z5112 Encounter for antineoplastic immunotherapy: Secondary | ICD-10-CM | POA: Diagnosis not present

## 2021-09-11 DIAGNOSIS — C3491 Malignant neoplasm of unspecified part of right bronchus or lung: Secondary | ICD-10-CM

## 2021-09-11 LAB — CBC WITH DIFFERENTIAL (CANCER CENTER ONLY)
Abs Immature Granulocytes: 0.02 10*3/uL (ref 0.00–0.07)
Basophils Absolute: 0 10*3/uL (ref 0.0–0.1)
Basophils Relative: 0 %
Eosinophils Absolute: 0.1 10*3/uL (ref 0.0–0.5)
Eosinophils Relative: 1 %
HCT: 41.2 % (ref 36.0–46.0)
Hemoglobin: 14 g/dL (ref 12.0–15.0)
Immature Granulocytes: 0 %
Lymphocytes Relative: 20 %
Lymphs Abs: 1 10*3/uL (ref 0.7–4.0)
MCH: 28.7 pg (ref 26.0–34.0)
MCHC: 34 g/dL (ref 30.0–36.0)
MCV: 84.6 fL (ref 80.0–100.0)
Monocytes Absolute: 0.4 10*3/uL (ref 0.1–1.0)
Monocytes Relative: 9 %
Neutro Abs: 3.5 10*3/uL (ref 1.7–7.7)
Neutrophils Relative %: 70 %
Platelet Count: 204 10*3/uL (ref 150–400)
RBC: 4.87 MIL/uL (ref 3.87–5.11)
RDW: 15.8 % — ABNORMAL HIGH (ref 11.5–15.5)
WBC Count: 5.1 10*3/uL (ref 4.0–10.5)
nRBC: 0 % (ref 0.0–0.2)

## 2021-09-11 LAB — CMP (CANCER CENTER ONLY)
ALT: 9 U/L (ref 0–44)
AST: 24 U/L (ref 15–41)
Albumin: 4.3 g/dL (ref 3.5–5.0)
Alkaline Phosphatase: 266 U/L — ABNORMAL HIGH (ref 38–126)
Anion gap: 7 (ref 5–15)
BUN: 15 mg/dL (ref 8–23)
CO2: 34 mmol/L — ABNORMAL HIGH (ref 22–32)
Calcium: 10 mg/dL (ref 8.9–10.3)
Chloride: 102 mmol/L (ref 98–111)
Creatinine: 0.58 mg/dL (ref 0.44–1.00)
GFR, Estimated: >60 mL/min (ref >60)
Glucose, Bld: 80 mg/dL (ref 70–99)
Potassium: 4.1 mmol/L (ref 3.5–5.1)
Sodium: 143 mmol/L (ref 135–145)
Total Bilirubin: 0.3 mg/dL (ref 0.3–1.2)
Total Protein: 7.2 g/dL (ref 6.5–8.1)

## 2021-09-11 LAB — TSH: TSH: 2.471 u[IU]/mL (ref 0.350–4.500)

## 2021-09-11 MED ORDER — SODIUM CHLORIDE 0.9 % IV SOLN
480.0000 mg | Freq: Once | INTRAVENOUS | Status: AC
  Administered 2021-09-11: 480 mg via INTRAVENOUS
  Filled 2021-09-11: qty 48

## 2021-09-11 MED ORDER — SODIUM CHLORIDE 0.9 % IV SOLN: Freq: Once | INTRAVENOUS | Status: AC

## 2021-09-11 NOTE — Patient Instructions (Signed)
[facility]  ? Discharge Instructions: ?Thank you for choosing [facility] to provide your oncology and hematology care.  ? ?If you have a lab appointment with the Cancer Center, please go directly to the Cancer Center and check in at the registration area. ?  ?Wear comfortable clothing and clothing appropriate for easy access to any Portacath or PICC line.  ? ?We strive to give you quality time with your provider. You may need to reschedule your appointment if you arrive late (15 or more minutes).  Arriving late affects you and other patients whose appointments are after yours.  Also, if you miss three or more appointments without notifying the office, you may be dismissed from the clinic at the provider?s discretion.    ?  ?For prescription refill requests, have your pharmacy contact our office and allow 72 hours for refills to be completed.   ? ?Today you received the following chemotherapy and/or immunotherapy agents: nivolumab    ?  ?To help prevent nausea and vomiting after your treatment, we encourage you to take your nausea medication as directed. ? ?BELOW ARE SYMPTOMS THAT SHOULD BE REPORTED IMMEDIATELY: ?*FEVER GREATER THAN 100.4 F (38 ?C) OR HIGHER ?*CHILLS OR SWEATING ?*NAUSEA AND VOMITING THAT IS NOT CONTROLLED WITH YOUR NAUSEA MEDICATION ?*UNUSUAL SHORTNESS OF BREATH ?*UNUSUAL BRUISING OR BLEEDING ?*URINARY PROBLEMS (pain or burning when urinating, or frequent urination) ?*BOWEL PROBLEMS (unusual diarrhea, constipation, pain near the anus) ?TENDERNESS IN MOUTH AND THROAT WITH OR WITHOUT PRESENCE OF ULCERS (sore throat, sores in mouth, or a toothache) ?UNUSUAL RASH, SWELLING OR PAIN  ?UNUSUAL VAGINAL DISCHARGE OR ITCHING  ? ?Items with * indicate a potential emergency and should be followed up as soon as possible or go to the Emergency Department if any problems should occur. ? ?Please show the CHEMOTHERAPY ALERT CARD or IMMUNOTHERAPY ALERT CARD at check-in  to the Emergency Department and triage nurse. ? ?Should you have questions after your visit or need to cancel or reschedule your appointment, please contact [facility]  Dept: [phone]  and follow the prompts.  Office hours are 8:00 a.m. to 4:30 p.m. Monday - Friday. Please note that voicemails left after 4:00 p.m. may not be returned until the following business day.  We are closed weekends and major holidays. You have access to a nurse at all times for urgent questions. Please call the main number to the clinic Dept: [phone] and follow the prompts. ? ? ?For any non-urgent questions, you may also contact your provider using MyChart. We now offer e-Visits for anyone 18 and older to request care online for non-urgent symptoms. For details visit [email or web address]. ?  ?Also download the MyChart app! Go to the app store, search "MyChart", open the app, select [facility], and log in with your MyChart username and password. ? ?Due to Covid, a mask is required upon entering the hospital/clinic. If you do not have a mask, one will be given to you upon arrival. For doctor visits, patients may have 1 support person aged 18 or older with them. For treatment visits, patients cannot have anyone with them due to current Covid guidelines and our immunocompromised population.  ? ?

## 2021-09-11 NOTE — Progress Notes (Signed)
[facility] [phone]   [phone]  OFFICE PROGRESS NOTE  [name], [place] [address]  DIAGNOSIS: Recurrent non-small cell lung cancer initially diagnosed as stage IIIA (T1b, N2, M0) non-small cell lung cancer presented with right middle lobe pulmonary nodule, mediastinal lymphadenopathy and highly suspicious for small nodule in the left upper lobe that could change her stage to stage IV that could present another synchronous primary lesion in the left upper lobe. This was diagnosed in September 2017.  PRIOR THERAPY:  1) Concurrent chemoradiation with weekly carboplatin for AUC of 2 and paclitaxel 45 MG/M2 status post 6 cycles last dose was given 02/25/2016 with partial response. 2) Consolidation chemotherapy with carboplatin for AUC of 5 and paclitaxel 175 MG/M2 every 3 weeks with Neulasta support. First dose 04/22/2016. Status post 3 cycles. 3) Second line treatment with immunotherapy with Nivolumab 480 mg IV every 4 weeks status post 32 cycles.  Discontinued secondary to intolerance and frequent hospitalization with pneumonia and pneumonitis.  She had a break of treatment between November 24, 2019 until June 28, 2020.  CURRENT THERAPY: Resuming her treatment with immunotherapy with nivolumab 480 mg IV every 4 weeks.  Cycle #33 started on June 28, 2020.  Status post 47 cycles.  INTERVAL HISTORY: Courtney Zuniga 71 y.o. female returns to the clinic today for follow-up visit.  The patient is feeling fine today with no concerning complaints except for the persistent cough and shortness of breath at baseline increased with exertion and she is currently on home oxygen.  She denied having any chest pain or hemoptysis.  She has no recent weight loss or night sweats.  She has no nausea, vomiting, diarrhea or constipation.  She has no headache or visual changes.  The patient is here today for evaluation before starting cycle #48 of her  treatment with nivolumab.  MEDICAL HISTORY: Past Medical History:  Diagnosis Date   Anemia    as a young woman   Arthritis    osteoartritis   Asthma    Brain tumor (benign) ([facility]) 2005 [facility]   Benign   Chronic headaches    Chronic hip pain    Chronic pain    COPD (chronic obstructive pulmonary disease) ([facility])    Coronary artery disease    Depression    Depression 05/15/2016   Encounter for antineoplastic chemotherapy 01/10/2016   GERD (gastroesophageal reflux disease)    Hypertension    Lung cancer ([facility]) dx'd 01/2016   currently on chemo and radiation    NSTEMI (non-ST elevated myocardial infarction) ([facility]) yrs ago   On home O2    qhs 2 liters at hs and prn   Pneumonia last time 2 yrs ago   Shortness of breath dyspnea    with activity    ALLERGIES:  is allergic to desyrel [trazodone] and no known allergies.  MEDICATIONS:  Current Outpatient Medications  Medication Sig Dispense Refill   acetaminophen (TYLENOL) 325 MG tablet Take 2 tablets (650 mg total) by mouth every 6 (six) hours as needed for mild pain, fever or headache. 12 tablet 2   albuterol (PROVENTIL HFA;VENTOLIN HFA) 108 (90 BASE) MCG/ACT inhaler Inhale 2 puffs into the lungs every 4 (four) hours as needed for shortness of breath.      atorvastatin (LIPITOR) 10 MG tablet Take 10 mg by mouth daily.     Cholecalciferol (VITAMIN D3) 125 MCG (5000 UT) CAPS Take 1 capsule by mouth daily.  0   cyclobenzaprine (FLEXERIL) 10 MG tablet Take 1 tablet (10 mg total) by mouth 2 (two) times daily. *MAy take one additional tablet as needed for muscle spasms (Patient taking differently: Take 10 mg by mouth 2 (two) times daily. *May take one additional tablet as needed for muscle spasms) 60 tablet 2   diclofenac sodium (VOLTAREN) 1 % GEL Apply topically 4 (four) times daily as needed (massge gel into affected area(s) as needed for pain).   1   DULoxetine (CYMBALTA) 60 MG capsule Cymbalta 60 mg capsule,delayed release  Take 1 capsule  every day by oral route.  stop 30 mg capsules     Dupilumab (DUPIXENT) 300 MG/2ML SOPN Inject 300 mg into the skin every 14 (fourteen) days. Twice a month     ENSURE (ENSURE) Take 237 mLs by mouth 3 (three) times daily between meals.     HYDROcodone bit-homatropine (HYCODAN) 5-1.5 MG/5ML syrup Take 5 mLs by mouth every 6 (six) hours as needed for cough. 120 mL 0   Ipratropium-Albuterol (COMBIVENT RESPIMAT) 20-100 MCG/ACT AERS respimat INHALE 2 PUFFS BY MOUTH FOUR TIMES DAILY AS NEEDED FOR WHEEZING     ipratropium-albuterol (DUONEB) 0.5-2.5 (3) MG/3ML SOLN Take 3 mLs by nebulization 3 (three) times daily. 360 mL 2   metoprolol tartrate (LOPRESSOR) 25 MG tablet Take 1 tablet (25 mg total) by mouth 2 (two) times daily. 60 tablet 0   oxyCODONE-acetaminophen (PERCOCET/ROXICET) 5-325 MG tablet Take 1 tablet by mouth every 8 (eight) hours as needed for moderate pain. 12 tablet 0   prochlorperazine (COMPAZINE) 10 MG tablet Take 1 tablet (10 mg total) by mouth every 6 (six) hours as needed for nausea or vomiting. 30 tablet 0   topiramate (TOPAMAX) 25 MG tablet Take 1 tablet by mouth daily at bedtime X 1 week; then increase to 25mg  BID. (Patient taking differently: Take 25 mg by mouth 2 (two) times daily.) 45 tablet 2   TRELEGY ELLIPTA 100-62.5-25 MCG/ACT AEPB Inhale 1 puff into the lungs daily.     Vitamin D, Ergocalciferol, (DRISDOL) 1.25 MG (50000 UNIT) CAPS capsule Take 50,000 Units by mouth once a week.     No current facility-administered medications for this visit.    SURGICAL HISTORY:  Past Surgical History:  Procedure Laterality Date   CHOLECYSTECTOMY     COLONOSCOPY  2015   Results requested from [facility]   COLONOSCOPY     ESOPHAGOGASTRODUODENOSCOPY N/A 08/14/2015   Procedure: ESOPHAGOGASTRODUODENOSCOPY (EGD);  Surgeon: Daneil Dolin, MD;  Location: [facility];  Service: Endoscopy;  Laterality: N/A;  215    ESOPHAGOGASTRODUODENOSCOPY (EGD) WITH PROPOFOL N/A 09/13/2015    Procedure: ESOPHAGOGASTRODUODENOSCOPY (EGD) WITH PROPOFOL;  Surgeon: Milus Banister, MD;  Location: [facility];  Service: Endoscopy;  Laterality: N/A;   EUS N/A 03/12/2017   Procedure: UPPER ENDOSCOPIC ULTRASOUND (EUS) RADIAL;  Surgeon: Milus Banister, MD;  Location: [facility];  Service: Endoscopy;  Laterality: N/A;   TUMOR REMOVAL  2005   Benign   UPPER ESOPHAGEAL ENDOSCOPIC ULTRASOUND (EUS)  09/13/2015   Procedure: UPPER ESOPHAGEAL ENDOSCOPIC ULTRASOUND (EUS);  Surgeon: Milus Banister, MD;  Location: [facility];  Service: Endoscopy;;   VIDEO BRONCHOSCOPY WITH ENDOBRONCHIAL NAVIGATION N/A 12/31/2015   Procedure: VIDEO BRONCHOSCOPY WITH ENDOBRONCHIAL NAVIGATION;  Surgeon: Melrose Nakayama, MD;  Location: [facility];  Service: Thoracic;  Laterality: N/A;   VIDEO BRONCHOSCOPY WITH ENDOBRONCHIAL ULTRASOUND N/A 11/08/2015   Procedure: VIDEO BRONCHOSCOPY WITH ENDOBRONCHIAL ULTRASOUND;  Surgeon: Ivin Poot, MD;  Location: [facility];  Service: Thoracic;  Laterality: N/A;    REVIEW OF SYSTEMS:  A comprehensive review of systems was negative except for: Constitutional: positive for fatigue Respiratory: positive for cough and dyspnea on exertion   PHYSICAL EXAMINATION: General appearance: alert, cooperative, fatigued, and no distress Head: Normocephalic, without obvious abnormality, atraumatic Neck: no adenopathy, no JVD, supple, symmetrical, trachea midline, and thyroid not enlarged, symmetric, no tenderness/mass/nodules Lymph nodes: Cervical, supraclavicular, and axillary nodes normal. Resp: clear to auscultation bilaterally Back: symmetric, no curvature. ROM normal. No CVA tenderness. Cardio: regular rate and rhythm, S1, S2 normal, no murmur, click, rub or gallop GI: soft, non-tender; bowel sounds normal; no masses,  no organomegaly Extremities: extremities normal, atraumatic, no cyanosis or edema  ECOG PERFORMANCE STATUS: 1 - Symptomatic but completely ambulatory  Blood pressure 108/79,  pulse (!) 109, temperature 97.7 F (36.5 C), temperature source Temporal, resp. rate 18, height 5\' 6"  (1.676 m), weight 133 lb 14.4 oz (60.7 kg), SpO2 97 %.  LABORATORY DATA: Lab Results  Component Value Date   WBC 5.1 09/11/2021   HGB 14.0 09/11/2021   HCT 41.2 09/11/2021   MCV 84.6 09/11/2021   PLT 204 09/11/2021      Chemistry      Component Value Date/Time   NA 143 09/11/2021 0916   NA 144 04/09/2017 0948   K 4.1 09/11/2021 0916   K 4.3 04/09/2017 0948   CL 102 09/11/2021 0916   CO2 34 (H) 09/11/2021 0916   CO2 25 04/09/2017 0948   BUN 15 09/11/2021 0916   BUN 16.8 04/09/2017 0948   CREATININE 0.58 09/11/2021 0916   CREATININE 0.8 04/09/2017 0948      Component Value Date/Time   CALCIUM 10.0 09/11/2021 0916   CALCIUM 9.1 04/09/2017 0948   ALKPHOS 266 (H) 09/11/2021 0916   ALKPHOS 216 (H) 04/09/2017 0948   AST 24 09/11/2021 0916   AST 12 04/09/2017 0948   ALT 9 09/11/2021 0916   ALT 13 04/09/2017 0948   BILITOT 0.3 09/11/2021 0916   BILITOT 0.22 04/09/2017 0948       RADIOGRAPHIC STUDIES:-   No results found.   ASSESSMENT AND PLAN:  This is a very pleasant 71 years old African-American female with a recurrent non-small cell lung cancer initially diagnosed as stage IIIA non-small cell lung cancer status post concurrent chemoradiation followed by consolidation chemotherapy with carboplatin and paclitaxel for 3 cycles. The patient was followed by observation but restaging imaging studies showed evidence for disease progression. She was started on second line treatment with immunotherapy with Nivolumab 480 mg IV every 4 weeks status post 32 cycles.   The patient has been in observation since August 2021 with no concerning complaints except for the baseline shortness of breath and intermittent headache. The patient resumed her treatment with cycle number cycle #33 of single agent nivolumab 480 mg IV every 4 weeks in March 2022 status post 47 cycles. She has been  tolerating this treatment well with no concerning adverse effects.  The patient has baseline cough and shortness of breath secondary to COPD. I recommended for her to proceed with cycle #48 today as planned. I will see her back for follow-up visit in 4 weeks with repeat CT scan of the chest for restaging of her disease. For the history of COPD, she will continue on home oxygen and nebulizer as prescribed by her primary care physician and pulmonologist. The patient was advised to call immediately if she has any other concerning symptoms  in the interval. All questions were answered. The patient knows to call the clinic with any problems, questions or concerns. We can certainly see the patient much sooner if necessary.  Disclaimer: This note was dictated with voice recognition software. Similar sounding words can inadvertently be transcribed and may not be corrected upon review.

## 2021-10-07 ENCOUNTER — Ambulatory Visit (HOSPITAL_COMMUNITY)
Admission: RE | Admit: 2021-10-07 | Discharge: 2021-10-07 | Disposition: A | Discharge status: Home / Self Care | Payer: Medicare Other | Source: Ambulatory Visit | Attending: Internal Medicine | Admitting: Internal Medicine

## 2021-10-07 DIAGNOSIS — C3490 Malignant neoplasm of unspecified part of unspecified bronchus or lung: Secondary | ICD-10-CM | POA: Diagnosis present

## 2021-10-07 MED ORDER — IOHEXOL 300 MG/ML  SOLN
75.0000 mL | Freq: Once | INTRAMUSCULAR | Status: AC | PRN
  Administered 2021-10-07: 75 mL via INTRAVENOUS

## 2021-10-07 NOTE — Progress Notes (Signed)
[facility] OFFICE PROGRESS NOTE  [name], [place] [address]  DIAGNOSIS: Recurrent non-small cell lung cancer initially diagnosed as stage IIIA (T1b, N2, M0) non-small cell lung cancer presented with right middle lobe pulmonary nodule, mediastinal lymphadenopathy and highly suspicious for small nodule in the left upper lobe that could change her stage to stage IV that could present another synchronous primary lesion in the left upper lobe. This was diagnosed in September 2017.  PRIOR THERAPY: 1) Concurrent chemoradiation with weekly carboplatin for AUC of 2 and paclitaxel 45 MG/M2 status post 6 cycles last dose was given 02/25/2016 with partial response. 2) Consolidation chemotherapy with carboplatin for AUC of 5 and paclitaxel 175 MG/M2 every 3 weeks with Neulasta support. First dose 04/22/2016. Status post 3 cycles. 3) Second line treatment with immunotherapy with Nivolumab 480 mg IV every 4 weeks status post 32 cycles.  Discontinued secondary to intolerance and frequent hospitalization with pneumonia and pneumonitis.  She had a break of treatment between November 24, 2019 until June 28, 2020.  CURRENT THERAPY: Resuming her treatment with immunotherapy with nivolumab 480 mg IV every 4 weeks.  Cycle #33 started on June 28, 2020.  Status post 48 cycles.  INTERVAL HISTORY: Courtney Zuniga 70 y.o. female returns to the clinic for a follow up visit. She reports her baseline shortness of breath as she wears 3 liters of oxygen via nasal cannula at all time. She reports a worsening cough for which she has been using Robitussin. She recently had a CT which did not show any pneumonia. She has been using her inhalers as prescribed. She reports some fatigue and a decreased appetite. She reports she just is not very hungry. She did not eat today. She sometimes will drink Ensure. She states she stays very hydrated drinking water but can only eat small amounts. Her only  pain aside from the headaches is her chronic hip/leg pain for which she takes Percocet as needed. The patient continues to tolerate treatment with immunotherapy well without any adverse effects. Denies any fever, chills, night sweats, or weight loss. Denies any chest pain or hemoptysis. Denies any nausea, vomiting, diarrhea, or constipation. Denies any rashes or skin changes. Sometimes she will feel gas pain and will take baking soda and water and use Alka-Seltzer which relieves her symptoms. The patient recently had a restaging CT scan. The patient is here today for evaluation and to review his scan prior to starting cycle # 13   MEDICAL HISTORY: Past Medical History:  Diagnosis Date   Anemia    as a young woman   Arthritis    osteoartritis   Asthma    Brain tumor (benign) ([facility]) 2005 [facility]   Benign   Chronic headaches    Chronic hip pain    Chronic pain    COPD (chronic obstructive pulmonary disease) ([facility])    Coronary artery disease    Depression    Depression 05/15/2016   Encounter for antineoplastic chemotherapy 01/10/2016   GERD (gastroesophageal reflux disease)    Hypertension    Lung cancer ([facility]) dx'd 01/2016   currently on chemo and radiation    NSTEMI (non-ST elevated myocardial infarction) ([facility]) yrs ago   On home O2    qhs 2 liters at hs and prn   Pneumonia last time 2 yrs ago   Shortness of breath dyspnea    with activity    ALLERGIES:  is allergic to desyrel [trazodone] and no known allergies.  MEDICATIONS:  Current Outpatient Medications  Medication Sig Dispense Refill   acetaminophen (TYLENOL) 325 MG tablet Take 2 tablets (650 mg total) by mouth every 6 (six) hours as needed for mild pain, fever or headache. 12 tablet 2   albuterol (PROVENTIL HFA;VENTOLIN HFA) 108 (90 BASE) MCG/ACT inhaler Inhale 2 puffs into the lungs every 4 (four) hours as needed for shortness of breath.      atorvastatin (LIPITOR) 10 MG tablet Take 10 mg by mouth daily.     Cholecalciferol  (VITAMIN D3) 125 MCG (5000 UT) CAPS Take 1 capsule by mouth daily.  0   cyclobenzaprine (FLEXERIL) 10 MG tablet Take 1 tablet (10 mg total) by mouth 2 (two) times daily. *MAy take one additional tablet as needed for muscle spasms (Patient taking differently: Take 10 mg by mouth 2 (two) times daily. *May take one additional tablet as needed for muscle spasms) 60 tablet 2   diclofenac sodium (VOLTAREN) 1 % GEL Apply topically 4 (four) times daily as needed (massge gel into affected area(s) as needed for pain).   1   DULoxetine (CYMBALTA) 60 MG capsule Cymbalta 60 mg capsule,delayed release  Take 1 capsule every day by oral route.  stop 30 mg capsules     Dupilumab (DUPIXENT) 300 MG/2ML SOPN Inject 300 mg into the skin every 14 (fourteen) days. Twice a month     ENSURE (ENSURE) Take 237 mLs by mouth 3 (three) times daily between meals.     HYDROcodone bit-homatropine (HYCODAN) 5-1.5 MG/5ML syrup Take 5 mLs by mouth every 6 (six) hours as needed for cough. 120 mL 0   Ipratropium-Albuterol (COMBIVENT RESPIMAT) 20-100 MCG/ACT AERS respimat INHALE 2 PUFFS BY MOUTH FOUR TIMES DAILY AS NEEDED FOR WHEEZING     ipratropium-albuterol (DUONEB) 0.5-2.5 (3) MG/3ML SOLN Take 3 mLs by nebulization 3 (three) times daily. 360 mL 2   metoprolol tartrate (LOPRESSOR) 25 MG tablet Take 1 tablet (25 mg total) by mouth 2 (two) times daily. 60 tablet 0   oxyCODONE-acetaminophen (PERCOCET/ROXICET) 5-325 MG tablet Take 1 tablet by mouth every 8 (eight) hours as needed for moderate pain. 12 tablet 0   prochlorperazine (COMPAZINE) 10 MG tablet Take 1 tablet (10 mg total) by mouth every 6 (six) hours as needed for nausea or vomiting. 30 tablet 0   topiramate (TOPAMAX) 25 MG tablet Take 1 tablet by mouth daily at bedtime X 1 week; then increase to 25mg  BID. (Patient taking differently: Take 25 mg by mouth 2 (two) times daily.) 45 tablet 2   TRELEGY ELLIPTA 100-62.5-25 MCG/ACT AEPB Inhale 1 puff into the lungs daily.     Vitamin D,  Ergocalciferol, (DRISDOL) 1.25 MG (50000 UNIT) CAPS capsule Take 50,000 Units by mouth once a week.     No current facility-administered medications for this visit.    SURGICAL HISTORY:  Past Surgical History:  Procedure Laterality Date   CHOLECYSTECTOMY     COLONOSCOPY  2015   Results requested from [facility]   COLONOSCOPY     ESOPHAGOGASTRODUODENOSCOPY N/A 08/14/2015   Procedure: ESOPHAGOGASTRODUODENOSCOPY (EGD);  Surgeon: Daneil Dolin, MD;  Location[facility];  Service: Endoscopy;  Laterality: N/A;  215    ESOPHAGOGASTRODUODENOSCOPY (EGD) WITH PROPOFOL N/A 09/13/2015   Procedure: ESOPHAGOGASTRODUODENOSCOPY (EGD) WITH PROPOFOL;  Surgeon: Milus Banister, MD;  Location: [facility] END[facility];  Service: Endoscopy;  Laterality: N/A;   EUS N/A 03/12/2017   Procedure: UPPER ENDOSCOPIC ULTRASOUND (EUS) RADIAL;  Surgeon: Milus Banister, MD;  Location: [facility]  ENDOSCOPY;  Service: Endoscopy;  Laterality: N/A;   TUMOR REMOVAL  2005   Benign   UPPER ESOPHAGEAL ENDOSCOPIC ULTRASOUND (EUS)  09/13/2015   Procedure: UPPER ESOPHAGEAL ENDOSCOPIC ULTRASOUND (EUS);  Surgeon: Milus Banister, MD;  Location: [facility];  Service: Endoscopy;;   VIDEO BRONCHOSCOPY WITH ENDOBRONCHIAL NAVIGATION N/A 12/31/2015   Procedure: VIDEO BRONCHOSCOPY WITH ENDOBRONCHIAL NAVIGATION;  Surgeon: Melrose Nakayama, MD;  Location: [facility];  Service: Thoracic;  Laterality: N/A;   VIDEO BRONCHOSCOPY WITH ENDOBRONCHIAL ULTRASOUND N/A 11/08/2015   Procedure: VIDEO BRONCHOSCOPY WITH ENDOBRONCHIAL ULTRASOUND;  Surgeon: Ivin Poot, MD;  Location: [facility] OR;  Service: Thoracic;  Laterality: N/A;    REVIEW OF SYSTEMS:   Review of Systems  Constitutional: Positive for appetite change and fatigue. Negative for appetite change, chills, fatigue, fever and unexpected weight change.  HENT: Negative for mouth sores, nosebleeds, sore throat and trouble swallowing.   Eyes: Negative for eye problems and icterus.  Respiratory: Positive  cough and shortness of breath. Negative for hemoptysis and wheezing.   Cardiovascular: Negative for chest pain and leg swelling.  Gastrointestinal: Negative for abdominal pain, constipation, diarrhea, nausea and vomiting.  Genitourinary: Negative for bladder incontinence, difficulty urinating, dysuria, frequency and hematuria.   Musculoskeletal: Negative for back pain, gait problem, neck pain and neck stiffness.  Skin: Negative for itching and rash.  Neurological: Positive headaches. Negative for dizziness, extremity weakness, gait problem, light-headedness and seizures.  Hematological: Negative for adenopathy. Does not bruise/bleed easily.  Psychiatric/Behavioral: Negative for confusion, depression and sleep disturbance. The patient is not nervous/anxious.     PHYSICAL EXAMINATION:  Blood pressure 106/75, pulse 97, temperature 98 F (36.7 C), temperature source Oral, resp. rate 15, height 5\' 6"  (1.676 m), weight 133 lb 9.6 oz (60.6 kg), SpO2 96 %.  ECOG PERFORMANCE STATUS: 2  Physical Exam  Constitutional: Oriented to person, place, and time and well-developed, well-nourished, and in no distress.  HENT:  Head: Normocephalic and atraumatic.  Mouth/Throat: Oropharynx is clear and moist. No oropharyngeal exudate.  Eyes: Conjunctivae are normal. Right eye exhibits no discharge. Left eye exhibits no discharge. No scleral icterus.  Neck: Normal range of motion. Neck supple.  Cardiovascular: Normal rate, regular rhythm, normal heart sounds and intact distal pulses.   Pulmonary/Chest: Clear to auscultation bilaterally. effort slightly increased. No rales.  Abdominal: Soft. Bowel sounds are normal. Exhibits no distension and no mass. There is no tenderness.  Musculoskeletal: Normal range of motion. Exhibits no edema.  Lymphadenopathy: No cervical adenopathy.  Neurological: Alert and oriented to person, place, and time. Exhibits also wasting.  Examined in the wheelchair. Skin: Skin is warm and  dry. No rash noted. Not diaphoretic. No erythema. No pallor.  Psychiatric: Mood, memory and judgment normal.  Vitals reviewed.  LABORATORY DATA: Lab Results  Component Value Date   WBC 4.7 10/09/2021   HGB 14.0 10/09/2021   HCT 41.7 10/09/2021   MCV 84.9 10/09/2021   PLT 202 10/09/2021      Chemistry      Component Value Date/Time   NA 142 10/09/2021 1032   NA 144 04/09/2017 0948   K 4.5 10/09/2021 1032   K 4.3 04/09/2017 0948   CL 105 10/09/2021 1032   CO2 33 (H) 10/09/2021 1032   CO2 25 04/09/2017 0948   BUN 18 10/09/2021 1032   BUN 16.8 04/09/2017 0948   CREATININE 0.53 10/09/2021 1032   CREATININE 0.8 04/09/2017 0948      Component Value Date/Time   CALCIUM 9.5 10/09/2021  1032   CALCIUM 9.1 04/09/2017 0948   ALKPHOS 259 (H) 10/09/2021 1032   ALKPHOS 216 (H) 04/09/2017 0948   AST 21 10/09/2021 1032   AST 12 04/09/2017 0948   ALT 8 10/09/2021 1032   ALT 13 04/09/2017 0948   BILITOT 0.2 (L) 10/09/2021 1032   BILITOT 0.22 04/09/2017 0948       RADIOGRAPHIC STUDIES:  CT Chest W Contrast  Result Date: 10/07/2021 CLINICAL DATA:  Non-small cell lung cancer.  * Tracking Code: BO * EXAM: CT CHEST WITH CONTRAST TECHNIQUE: Multidetector CT imaging of the chest was performed during intravenous contrast administration. RADIATION DOSE REDUCTION: This exam was performed according to the departmental dose-optimization program which includes automated exposure control, adjustment of the mA and/or kV according to patient size and/or use of iterative reconstruction technique. CONTRAST:  63mL OMNIPAQUE IOHEXOL 300 MG/ML  SOLN COMPARISON:  07/15/2021. FINDINGS: Cardiovascular: Atherosclerotic calcification of the aorta, aortic valve and coronary arteries. Enlarged pulmonic trunk. Heart size normal. No pericardial effusion. Mediastinum/Nodes: No pathologically enlarged mediastinal, left hilar or axillary lymph nodes. Right hilar soft tissue thickening, as before. Esophagus is grossly  unremarkable. Lungs/Pleura: Bullous emphysema. Spiculated right upper lobe nodular consolidation measures 1.9 x 2.0 cm (7/31), stable. Persistent narrowing of the right middle lobe bronchus with associated central soft tissue/volume loss, unchanged. Additional scattered pulmonary parenchymal scarring. No new pulmonary nodules. No pleural fluid. Minimal debris in the airway. Upper Abdomen: Visualized portions of the liver and adrenal glands are unremarkable. Low-attenuation lesions in the kidneys measure up to 13 mm, too small to characterize. No specific follow-up necessary. Visualized portions of the spleen, pancreas, stomach and bowel are grossly unremarkable. No upper abdominal adenopathy. Musculoskeletal: Bone islands in the right seventh anterolateral rib. Probable hemangioma in the T12 vertebral body. No worrisome lytic or sclerotic lesions. IMPRESSION: 1. Right upper lobe nodule, stable. 2. Post treatment volume loss and narrowing of the right middle lobe bronchus, stable. 3. Aortic atherosclerosis (ICD10-I70.0). Coronary artery calcification. 4. Enlarged pulmonic trunk, indicative of pulmonary arterial hypertension. 5.  Emphysema (ICD10-J43.9). Electronically Signed   By: Lorin Picket M.D.   On: 10/07/2021 12:59     ASSESSMENT/PLAN:  This is a very pleasant 71 year old African-American female with recurrent metastatic non-small cell lung cancer, adenocarcinoma of the right middle lobe.  She was initially diagnosed as stage IIIa in September 2017.     She completed concurrent chemoradiation followed by consolidation with carboplatin and paclitaxel. She is status post 3 cycles. The patient was on observation but a restaging CT scan showed evidence of disease progression.   She is currently undergoing treatment with second line with immunotherapy with nivolumab 480 mg IV every 4 weeks.  She was status post 31 cycles. Her treatment had been on hold since August 2021 until her COPD can get under  control. She showed evidence of some recurrent disease in March 2022 and she resumed her treatment with cycle number cycle #33 of single agent nivolumab 480 mg IV every 4 weeks in March 2022 status post 48 cycles.   The patient recently had a restaging CT scan. Dr. Julien Nordmann personally and independently reviewed the scan and discussed the results with the patient. The scan did not show any evidence for disease progression. Dr. Julien Nordmann recommends that she continue with cycle #49 today as scheduled.   We will see her back for a follow up visit in 3 weeks before starting cycle #50.   For the history of COPD, she will continue on home  oxygen and nebulizer as prescribed by her primary care physician and pulmonologist.  The patient was advised to call immediately if she has any concerning symptoms in the interval. The patient voices understanding of current disease status and treatment options and is in agreement with the current care plan. All questions were answered. The patient knows to call the clinic with any problems, questions or concerns. We can certainly see the patient much sooner if necessary  No orders of the defined types were placed in this encounter.    Courtney Romaniello L Sharnita Bogucki, PA-C 10/09/21  ADDENDUM: Hematology/Oncology Attending: I had a face-to-face encounter with the patient today.  I reviewed her records, lab, scan and recommended her care plan.  This is a very pleasant 71 years old African-American female with recurrent non-small cell lung cancer that was initially diagnosed as a stage IIIa in September 2017.  The patient was initially treated with a course of concurrent chemoradiation with weekly carboplatin and paclitaxel but she had evidence for disease progression and she started on second line treatment with immunotherapy with nivolumab 480 Mg IV every 4 weeks status post 48 cycles. She has been tolerating her treatment with immunotherapy fairly well with no concerning  adverse effects.  The patient has baseline COPD and she is current on home oxygen. She also has intermittent headaches that was evaluated in the past by repeat imaging studies with MRI of the brain that were unremarkable. She had repeat CT scan of the chest performed recently.  I personally and independently reviewed the scans and discussed the result with the patient today. Her scan showed no concerning findings for disease progression. I recommended for the patient to continue her treatment as planned and she will come back for follow-up visit in 4 weeks for evaluation before the next cycle of her treatment. The patient was advised to call immediately if she has any other concerning symptoms in the interval. The total time spent in the appointment was 30 minutes. Disclaimer: This note was dictated with voice recognition software. Similar sounding words can inadvertently be transcribed and may be missed upon review. Eilleen Kempf, MD

## 2021-10-09 ENCOUNTER — Inpatient Hospital Stay: Payer: Medicare Other

## 2021-10-09 ENCOUNTER — Inpatient Hospital Stay: Payer: Medicare Other | Attending: Physician Assistant | Admitting: Physician Assistant

## 2021-10-09 ENCOUNTER — Other Ambulatory Visit: Payer: Self-pay

## 2021-10-09 VITALS — BP 106/75 | HR 97 | Temp 98.0°F | Resp 15 | Ht 66.0 in | Wt 133.6 lb

## 2021-10-09 DIAGNOSIS — C342 Malignant neoplasm of middle lobe, bronchus or lung: Secondary | ICD-10-CM | POA: Diagnosis present

## 2021-10-09 DIAGNOSIS — C3491 Malignant neoplasm of unspecified part of right bronchus or lung: Secondary | ICD-10-CM

## 2021-10-09 DIAGNOSIS — Z5112 Encounter for antineoplastic immunotherapy: Secondary | ICD-10-CM | POA: Insufficient documentation

## 2021-10-09 DIAGNOSIS — Z79899 Other long term (current) drug therapy: Secondary | ICD-10-CM | POA: Insufficient documentation

## 2021-10-09 DIAGNOSIS — C3490 Malignant neoplasm of unspecified part of unspecified bronchus or lung: Secondary | ICD-10-CM

## 2021-10-09 LAB — CBC WITH DIFFERENTIAL (CANCER CENTER ONLY)
Abs Immature Granulocytes: 0.01 10*3/uL (ref 0.00–0.07)
Basophils Absolute: 0 10*3/uL (ref 0.0–0.1)
Basophils Relative: 0 %
Eosinophils Absolute: 0.1 10*3/uL (ref 0.0–0.5)
Eosinophils Relative: 2 %
HCT: 41.7 % (ref 36.0–46.0)
Hemoglobin: 14 g/dL (ref 12.0–15.0)
Immature Granulocytes: 0 %
Lymphocytes Relative: 27 %
Lymphs Abs: 1.3 10*3/uL (ref 0.7–4.0)
MCH: 28.5 pg (ref 26.0–34.0)
MCHC: 33.6 g/dL (ref 30.0–36.0)
MCV: 84.9 fL (ref 80.0–100.0)
Monocytes Absolute: 0.4 10*3/uL (ref 0.1–1.0)
Monocytes Relative: 9 %
Neutro Abs: 2.9 10*3/uL (ref 1.7–7.7)
Neutrophils Relative %: 62 %
Platelet Count: 202 10*3/uL (ref 150–400)
RBC: 4.91 MIL/uL (ref 3.87–5.11)
RDW: 15.9 % — ABNORMAL HIGH (ref 11.5–15.5)
WBC Count: 4.7 10*3/uL (ref 4.0–10.5)
nRBC: 0 % (ref 0.0–0.2)

## 2021-10-09 LAB — CMP (CANCER CENTER ONLY)
ALT: 8 U/L (ref 0–44)
AST: 21 U/L (ref 15–41)
Albumin: 4.2 g/dL (ref 3.5–5.0)
Alkaline Phosphatase: 259 U/L — ABNORMAL HIGH (ref 38–126)
Anion gap: 4 — ABNORMAL LOW (ref 5–15)
BUN: 18 mg/dL (ref 8–23)
CO2: 33 mmol/L — ABNORMAL HIGH (ref 22–32)
Calcium: 9.5 mg/dL (ref 8.9–10.3)
Chloride: 105 mmol/L (ref 98–111)
Creatinine: 0.53 mg/dL (ref 0.44–1.00)
GFR, Estimated: >60 mL/min (ref >60)
Glucose, Bld: 64 mg/dL — ABNORMAL LOW (ref 70–99)
Potassium: 4.5 mmol/L (ref 3.5–5.1)
Sodium: 142 mmol/L (ref 135–145)
Total Bilirubin: 0.2 mg/dL — ABNORMAL LOW (ref 0.3–1.2)
Total Protein: 6.9 g/dL (ref 6.5–8.1)

## 2021-10-09 LAB — TSH: TSH: 3.961 u[IU]/mL (ref 0.350–4.500)

## 2021-10-09 MED ORDER — SODIUM CHLORIDE 0.9 % IV SOLN
480.0000 mg | Freq: Once | INTRAVENOUS | Status: AC
  Administered 2021-10-09: 480 mg via INTRAVENOUS
  Filled 2021-10-09: qty 48

## 2021-10-09 MED ORDER — SODIUM CHLORIDE 0.9 % IV SOLN: Freq: Once | INTRAVENOUS | Status: AC

## 2021-10-09 NOTE — Patient Instructions (Signed)
[facility] MEDICAL ONCOLOGY   Discharge Instructions: Thank you for choosing [facility] to provide your oncology and hematology care.   If you have a lab appointment with the Cancer Center, please go directly to the Cancer Center and check in at the registration area.   Wear comfortable clothing and clothing appropriate for easy access to any Portacath or PICC line.   We strive to give you quality time with your provider. You may need to reschedule your appointment if you arrive late (15 or more minutes).  Arriving late affects you and other patients whose appointments are after yours.  Also, if you miss three or more appointments without notifying the office, you may be dismissed from the clinic at the provider's discretion.      For prescription refill requests, have your pharmacy contact our office and allow 72 hours for refills to be completed.    Today you received the following chemotherapy and/or immunotherapy agents: nivolumab      To help prevent nausea and vomiting after your treatment, we encourage you to take your nausea medication as directed.  BELOW ARE SYMPTOMS THAT SHOULD BE REPORTED IMMEDIATELY: *FEVER GREATER THAN 100.4 F (38 C) OR HIGHER *CHILLS OR SWEATING *NAUSEA AND VOMITING THAT IS NOT CONTROLLED WITH YOUR NAUSEA MEDICATION *UNUSUAL SHORTNESS OF BREATH *UNUSUAL BRUISING OR BLEEDING *URINARY PROBLEMS (pain or burning when urinating, or frequent urination) *BOWEL PROBLEMS (unusual diarrhea, constipation, pain near the anus) TENDERNESS IN MOUTH AND THROAT WITH OR WITHOUT PRESENCE OF ULCERS (sore throat, sores in mouth, or a toothache) UNUSUAL RASH, SWELLING OR PAIN  UNUSUAL VAGINAL DISCHARGE OR ITCHING   Items with * indicate a potential emergency and should be followed up as soon as possible or go to the Emergency Department if any problems should occur.  Please show the CHEMOTHERAPY ALERT CARD or IMMUNOTHERAPY ALERT CARD at check-in  to the Emergency Department and triage nurse.  Should you have questions after your visit or need to cancel or reschedule your appointment, please contact [facility]ICAL [facility]COL[facility]  Dept: [phone]  and follow the prompts.  Office hours are 8:00 a.m. to 4:30 p.m. Monday - Friday. Please note that voicemails left after 4:00 p.m. may not be returned until the following business day.  We are closed weekends and major holidays. You have access to a nurse at all times for urgent questions. Please call the main number to the clinic Dept: [phone] and follow the prompts.   For any non-urgent questions, you may also contact your provider using MyChart. We now offer e-Visits for anyone 18 and older to request care online for non-urgent symptoms. For details visit [email or web address].   Also download the MyChart app! Go to the app store, search "MyChart", open the app, select [facility], and log in with your MyChart username and password.  Masks are optional in the cancer centers. If you would like for your care team to wear a mask while they are taking care of you, please let them know. For doctor visits, patients may have with them one support person who is at least 72 years old. At this time, visitors are not allowed in the infusion area. 

## 2021-11-04 ENCOUNTER — Other Ambulatory Visit: Payer: Self-pay

## 2021-11-06 ENCOUNTER — Inpatient Hospital Stay: Payer: Medicare Other | Attending: Physician Assistant

## 2021-11-06 ENCOUNTER — Inpatient Hospital Stay: Payer: Medicare Other

## 2021-11-06 ENCOUNTER — Inpatient Hospital Stay (HOSPITAL_BASED_OUTPATIENT_CLINIC_OR_DEPARTMENT_OTHER): Payer: Medicare Other | Admitting: Internal Medicine

## 2021-11-06 ENCOUNTER — Other Ambulatory Visit: Payer: Self-pay

## 2021-11-06 VITALS — BP 135/79 | HR 97 | Temp 97.5°F | Resp 17 | Wt 126.2 lb

## 2021-11-06 DIAGNOSIS — C3491 Malignant neoplasm of unspecified part of right bronchus or lung: Secondary | ICD-10-CM

## 2021-11-06 DIAGNOSIS — C3490 Malignant neoplasm of unspecified part of unspecified bronchus or lung: Secondary | ICD-10-CM

## 2021-11-06 DIAGNOSIS — Z5112 Encounter for antineoplastic immunotherapy: Secondary | ICD-10-CM | POA: Insufficient documentation

## 2021-11-06 DIAGNOSIS — Z79899 Other long term (current) drug therapy: Secondary | ICD-10-CM | POA: Diagnosis not present

## 2021-11-06 DIAGNOSIS — C342 Malignant neoplasm of middle lobe, bronchus or lung: Secondary | ICD-10-CM | POA: Insufficient documentation

## 2021-11-06 LAB — CMP (CANCER CENTER ONLY)
ALT: 9 U/L (ref 0–44)
AST: 21 U/L (ref 15–41)
Albumin: 4.2 g/dL (ref 3.5–5.0)
Alkaline Phosphatase: 252 U/L — ABNORMAL HIGH (ref 38–126)
Anion gap: 7 (ref 5–15)
BUN: 20 mg/dL (ref 8–23)
CO2: 34 mmol/L — ABNORMAL HIGH (ref 22–32)
Calcium: 9.2 mg/dL (ref 8.9–10.3)
Chloride: 103 mmol/L (ref 98–111)
Creatinine: 0.51 mg/dL (ref 0.44–1.00)
GFR, Estimated: >60 mL/min (ref >60)
Glucose, Bld: 79 mg/dL (ref 70–99)
Potassium: 3.9 mmol/L (ref 3.5–5.1)
Sodium: 144 mmol/L (ref 135–145)
Total Bilirubin: 0.3 mg/dL (ref 0.3–1.2)
Total Protein: 7 g/dL (ref 6.5–8.1)

## 2021-11-06 LAB — CBC WITH DIFFERENTIAL (CANCER CENTER ONLY)
Abs Immature Granulocytes: 0 10*3/uL (ref 0.00–0.07)
Basophils Absolute: 0 10*3/uL (ref 0.0–0.1)
Basophils Relative: 0 %
Eosinophils Absolute: 0 10*3/uL (ref 0.0–0.5)
Eosinophils Relative: 1 %
HCT: 41 % (ref 36.0–46.0)
Hemoglobin: 14.1 g/dL (ref 12.0–15.0)
Immature Granulocytes: 0 %
Lymphocytes Relative: 21 %
Lymphs Abs: 0.8 10*3/uL (ref 0.7–4.0)
MCH: 29 pg (ref 26.0–34.0)
MCHC: 34.4 g/dL (ref 30.0–36.0)
MCV: 84.4 fL (ref 80.0–100.0)
Monocytes Absolute: 0.3 10*3/uL (ref 0.1–1.0)
Monocytes Relative: 7 %
Neutro Abs: 2.8 10*3/uL (ref 1.7–7.7)
Neutrophils Relative %: 71 %
Platelet Count: 158 10*3/uL (ref 150–400)
RBC: 4.86 MIL/uL (ref 3.87–5.11)
RDW: 15.4 % (ref 11.5–15.5)
WBC Count: 3.9 10*3/uL — ABNORMAL LOW (ref 4.0–10.5)
nRBC: 0 % (ref 0.0–0.2)

## 2021-11-06 LAB — TSH: TSH: 1.518 u[IU]/mL (ref 0.350–4.500)

## 2021-11-06 MED ORDER — SODIUM CHLORIDE 0.9 % IV SOLN: Freq: Once | INTRAVENOUS | Status: AC

## 2021-11-06 MED ORDER — SODIUM CHLORIDE 0.9 % IV SOLN
480.0000 mg | Freq: Once | INTRAVENOUS | Status: AC
  Administered 2021-11-06: 480 mg via INTRAVENOUS
  Filled 2021-11-06: qty 48

## 2021-11-06 NOTE — Progress Notes (Signed)
[place] [phone]   [phone]  OFFICE PROGRESS NOTE  [name], [place] [address]  DIAGNOSIS: Recurrent non-small cell lung cancer initially diagnosed as stage IIIA (T1b, N2, M0) non-small cell lung cancer presented with right middle lobe pulmonary nodule, mediastinal lymphadenopathy and highly suspicious for small nodule in the left upper lobe that could change her stage to stage IV that could present another synchronous primary lesion in the left upper lobe. This was diagnosed in September 2017.  PRIOR THERAPY:  1) Concurrent chemoradiation with weekly carboplatin for AUC of 2 and paclitaxel 45 MG/M2 status post 6 cycles last dose was given 02/25/2016 with partial response. 2) Consolidation chemotherapy with carboplatin for AUC of 5 and paclitaxel 175 MG/M2 every 3 weeks with Neulasta support. First dose 04/22/2016. Status post 3 cycles. 3) Second line treatment with immunotherapy with Nivolumab 480 mg IV every 4 weeks status post 32 cycles.  Discontinued secondary to intolerance and frequent hospitalization with pneumonia and pneumonitis.  She had a break of treatment between November 24, 2019 until June 28, 2020.  CURRENT THERAPY: Resuming her treatment with immunotherapy with nivolumab 480 mg IV every 4 weeks.  Cycle #33 started on June 28, 2020.  Status post 49 cycles.  INTERVAL HISTORY: Courtney Zuniga 71 y.o. female returns to the clinic today for follow-up visit.  The patient is feeling fine today with no concerning complaints except for the baseline shortness of breath.  She also has chest congestion from recent cold symptoms.  She denied having any current chest pain she is on home oxygen.  She denied having any fever or chills.  She has no nausea, vomiting, diarrhea or constipation.  She lost few pounds since her last visit because of lack of appetite with the recent cold symptoms.  She is here today for  evaluation before starting cycle #50 of her treatment.    MEDICAL HISTORY: Past Medical History:  Diagnosis Date   Anemia    as a young woman   Arthritis    osteoartritis   Asthma    Brain tumor (benign) ([place]) 2005 [facility]   Benign   Chronic headaches    Chronic hip pain    Chronic pain    COPD (chronic obstructive pulmonary disease) (HCC)    Coronary artery disease    Depression    Depression 05/15/2016   Encounter for antineoplastic chemotherapy 01/10/2016   GERD (gastroesophageal reflux disease)    Hypertension    Lung cancer ([place]) dx'd 01/2016   currently on chemo and radiation    NSTEMI (non-ST elevated myocardial infarction) ([place]) yrs ago   On home O2    qhs 2 liters at hs and prn   Pneumonia last time 2 yrs ago   Shortness of breath dyspnea    with activity    ALLERGIES:  is allergic to desyrel [trazodone] and no known allergies.  MEDICATIONS:  Current Outpatient Medications  Medication Sig Dispense Refill   acetaminophen (TYLENOL) 325 MG tablet Take 2 tablets (650 mg total) by mouth every 6 (six) hours as needed for mild pain, fever or headache. 12 tablet 2   albuterol (PROVENTIL HFA;VENTOLIN HFA) 108 (90 BASE) MCG/ACT inhaler Inhale 2 puffs into the lungs every 4 (four) hours as needed for shortness of breath.      atorvastatin (LIPITOR) 10 MG tablet Take 10 mg by mouth daily.     Cholecalciferol (VITAMIN D3) 125 MCG (5000 UT)  CAPS Take 1 capsule by mouth daily.  0   cyclobenzaprine (FLEXERIL) 10 MG tablet Take 1 tablet (10 mg total) by mouth 2 (two) times daily. *MAy take one additional tablet as needed for muscle spasms (Patient taking differently: Take 10 mg by mouth 2 (two) times daily. *May take one additional tablet as needed for muscle spasms) 60 tablet 2   diclofenac sodium (VOLTAREN) 1 % GEL Apply topically 4 (four) times daily as needed (massge gel into affected area(s) as needed for pain).   1   DULoxetine (CYMBALTA) 60 MG capsule Cymbalta 60 mg  capsule,delayed release  Take 1 capsule every day by oral route.  stop 30 mg capsules     Dupilumab (DUPIXENT) 300 MG/2ML SOPN Inject 300 mg into the skin every 14 (fourteen) days. Twice a month     ENSURE (ENSURE) Take 237 mLs by mouth 3 (three) times daily between meals.     HYDROcodone bit-homatropine (HYCODAN) 5-1.5 MG/5ML syrup Take 5 mLs by mouth every 6 (six) hours as needed for cough. 120 mL 0   Ipratropium-Albuterol (COMBIVENT RESPIMAT) 20-100 MCG/ACT AERS respimat INHALE 2 PUFFS BY MOUTH FOUR TIMES DAILY AS NEEDED FOR WHEEZING     ipratropium-albuterol (DUONEB) 0.5-2.5 (3) MG/3ML SOLN Take 3 mLs by nebulization 3 (three) times daily. 360 mL 2   metoprolol tartrate (LOPRESSOR) 25 MG tablet Take 1 tablet (25 mg total) by mouth 2 (two) times daily. 60 tablet 0   oxyCODONE-acetaminophen (PERCOCET/ROXICET) 5-325 MG tablet Take 1 tablet by mouth every 8 (eight) hours as needed for moderate pain. 12 tablet 0   prochlorperazine (COMPAZINE) 10 MG tablet Take 1 tablet (10 mg total) by mouth every 6 (six) hours as needed for nausea or vomiting. 30 tablet 0   topiramate (TOPAMAX) 25 MG tablet Take 1 tablet by mouth daily at bedtime X 1 week; then increase to 25mg  BID. (Patient taking differently: Take 25 mg by mouth 2 (two) times daily.) 45 tablet 2   TRELEGY ELLIPTA 100-62.5-25 MCG/ACT AEPB Inhale 1 puff into the lungs daily.     Vitamin D, Ergocalciferol, (DRISDOL) 1.25 MG (50000 UNIT) CAPS capsule Take 50,000 Units by mouth once a week.     No current facility-administered medications for this visit.    SURGICAL HISTORY:  Past Surgical History:  Procedure Laterality Date   CHOLECYSTECTOMY     COLONOSCOPY  2015   Results requested from [facility]   COLONOSCOPY     ESOPHAGOGASTRODUODENOSCOPY N/A 08/14/2015   Procedure: ESOPHAGOGASTRODUODENOSCOPY (EGD);  Surgeon: Daneil Dolin, MD;  Location[facility];  Service: Endoscopy;  Laterality: N/A;  215    ESOPHAGOGASTRODUODENOSCOPY  (EGD) WITH PROPOFOL N/A 09/13/2015   Procedure: ESOPHAGOGASTRODUODENOSCOPY (EGD) WITH PROPOFOL;  Surgeon: Milus Banister, MD;  Location: [facility];  Service: Endoscopy;  Laterality: N/A;   EUS N/A 03/12/2017   Procedure: UPPER ENDOSCOPIC ULTRASOUND (EUS) RADIAL;  Surgeon: Milus Banister, MD;  Location: [facility];  Service: Endoscopy;  Laterality: N/A;   TUMOR REMOVAL  2005   Benign   UPPER ESOPHAGEAL ENDOSCOPIC ULTRASOUND (EUS)  09/13/2015   Procedure: UPPER ESOPHAGEAL ENDOSCOPIC ULTRASOUND (EUS);  Surgeon: Milus Banister, MD;  Location: [facility];  Service: Endoscopy;;   VIDEO BRONCHOSCOPY WITH ENDOBRONCHIAL NAVIGATION N/A 12/31/2015   Procedure: VIDEO BRONCHOSCOPY WITH ENDOBRONCHIAL NAVIGATION;  Surgeon: Melrose Nakayama, MD;  Location: [facility];  Service: Thoracic;  Laterality: N/A;   VIDEO BRONCHOSCOPY WITH ENDOBRONCHIAL ULTRASOUND N/A 11/08/2015   Procedure: VIDEO BRONCHOSCOPY WITH  ENDOBRONCHIAL ULTRASOUND;  Surgeon: Ivin Poot, MD;  Location: [facility];  Service: Thoracic;  Laterality: N/A;    REVIEW OF SYSTEMS:  A comprehensive review of systems was negative except for: Constitutional: positive for fatigue Respiratory: positive for cough and dyspnea on exertion   PHYSICAL EXAMINATION: General appearance: alert, cooperative, fatigued, and no distress Head: Normocephalic, without obvious abnormality, atraumatic Neck: no adenopathy, no JVD, supple, symmetrical, trachea midline, and thyroid not enlarged, symmetric, no tenderness/mass/nodules Lymph nodes: Cervical, supraclavicular, and axillary nodes normal. Resp: clear to auscultation bilaterally Back: symmetric, no curvature. ROM normal. No CVA tenderness. Cardio: regular rate and rhythm, S1, S2 normal, no murmur, click, rub or gallop GI: soft, non-tender; bowel sounds normal; no masses,  no organomegaly Extremities: extremities normal, atraumatic, no cyanosis or edema  ECOG PERFORMANCE STATUS: 1 - Symptomatic but completely  ambulatory  Blood pressure 135/79, pulse 97, temperature (!) 97.5 F (36.4 C), temperature source Tympanic, resp. rate 17, weight 126 lb 4 oz (57.3 kg), SpO2 (!) 86 %.  LABORATORY DATA: Lab Results  Component Value Date   WBC 3.9 (L) 11/06/2021   HGB 14.1 11/06/2021   HCT 41.0 11/06/2021   MCV 84.4 11/06/2021   PLT 158 11/06/2021      Chemistry      Component Value Date/Time   NA 144 11/06/2021 0841   NA 144 04/09/2017 0948   K 3.9 11/06/2021 0841   K 4.3 04/09/2017 0948   CL 103 11/06/2021 0841   CO2 34 (H) 11/06/2021 0841   CO2 25 04/09/2017 0948   BUN 20 11/06/2021 0841   BUN 16.8 04/09/2017 0948   CREATININE 0.51 11/06/2021 0841   CREATININE 0.8 04/09/2017 0948      Component Value Date/Time   CALCIUM 9.2 11/06/2021 0841   CALCIUM 9.1 04/09/2017 0948   ALKPHOS 252 (H) 11/06/2021 0841   ALKPHOS 216 (H) 04/09/2017 0948   AST 21 11/06/2021 0841   AST 12 04/09/2017 0948   ALT 9 11/06/2021 0841   ALT 13 04/09/2017 0948   BILITOT 0.3 11/06/2021 0841   BILITOT 0.22 04/09/2017 0948       RADIOGRAPHIC STUDIES:-   No results found.   ASSESSMENT AND PLAN:  This is a very pleasant 71 years old African-American female with a recurrent non-small cell lung cancer initially diagnosed as stage IIIA non-small cell lung cancer status post concurrent chemoradiation followed by consolidation chemotherapy with carboplatin and paclitaxel for 3 cycles. The patient was followed by observation but restaging imaging studies showed evidence for disease progression. She was started on second line treatment with immunotherapy with Nivolumab 480 mg IV every 4 weeks status post 32 cycles.   The patient has been in observation since August 2021 with no concerning complaints except for the baseline shortness of breath and intermittent headache. The patient resumed her treatment with cycle number cycle #33 of single agent nivolumab 480 mg IV every 4 weeks in March 2022 status post 49  cycles. She has been tolerating this treatment well with no concerning adverse effects. I recommended for her to proceed with cycle #50 today as planned. For the history of COPD, she will continue on home oxygen and nebulizer as prescribed by her primary care physician and pulmonologist. I will see her back for follow-up visit in 4 weeks for evaluation before starting cycle #51. The patient was advised to call immediately if she has any concerning symptoms in the interval. All questions were answered. The patient knows to call the clinic  with any problems, questions or concerns. We can certainly see the patient much sooner if necessary.  Disclaimer: This note was dictated with voice recognition software. Similar sounding words can inadvertently be transcribed and may not be corrected upon review.

## 2021-11-06 NOTE — Patient Instructions (Signed)
[facility]   Discharge Instructions: Thank you for choosing [facility] to provide your oncology and hematology care.   If you have a lab appointment with the Cancer Center, please go directly to the Cancer Center and check in at the registration area.   Wear comfortable clothing and clothing appropriate for easy access to any Portacath or PICC line.   We strive to give you quality time with your provider. You may need to reschedule your appointment if you arrive late (15 or more minutes).  Arriving late affects you and other patients whose appointments are after yours.  Also, if you miss three or more appointments without notifying the office, you may be dismissed from the clinic at the provider's discretion.      For prescription refill requests, have your pharmacy contact our office and allow 72 hours for refills to be completed.    Today you received the following chemotherapy and/or immunotherapy agents: nivolumab      To help prevent nausea and vomiting after your treatment, we encourage you to take your nausea medication as directed.  BELOW ARE SYMPTOMS THAT SHOULD BE REPORTED IMMEDIATELY: *FEVER GREATER THAN 100.4 F (38 C) OR HIGHER *CHILLS OR SWEATING *NAUSEA AND VOMITING THAT IS NOT CONTROLLED WITH YOUR NAUSEA MEDICATION *UNUSUAL SHORTNESS OF BREATH *UNUSUAL BRUISING OR BLEEDING *URINARY PROBLEMS (pain or burning when urinating, or frequent urination) *BOWEL PROBLEMS (unusual diarrhea, constipation, pain near the anus) TENDERNESS IN MOUTH AND THROAT WITH OR WITHOUT PRESENCE OF ULCERS (sore throat, sores in mouth, or a toothache) UNUSUAL RASH, SWELLING OR PAIN  UNUSUAL VAGINAL DISCHARGE OR ITCHING   Items with * indicate a potential emergency and should be followed up as soon as possible or go to the Emergency Department if any problems should occur.  Please show the CHEMOTHERAPY ALERT CARD or IMMUNOTHERAPY ALERT CARD at check-in  to the Emergency Department and triage nurse.  Should you have questions after your visit or need to cancel or reschedule your appointment, please contact [facility]  Dept: [phone]  and follow the prompts.  Office hours are 8:00 a.m. to 4:30 p.m. Monday - Friday. Please note that voicemails left after 4:00 p.m. may not be returned until the following business day.  We are closed weekends and major holidays. You have access to a nurse at all times for urgent questions. Please call the main number to the clinic Dept: [phone] and follow the prompts.   For any non-urgent questions, you may also contact your provider using MyChart. We now offer e-Visits for anyone 18 and older to request care online for non-urgent symptoms. For details visit [email or web address].   Also download the MyChart app! Go to the app store, search "MyChart", open the app, select [facility], and log in with your MyChart username and password.  Masks are optional in the cancer centers. If you would like for your care team to wear a mask while they are taking care of you, please let them know. For doctor visits, patients may have with them one support Ervan Heber who is at least 72 years old. At this time, visitors are not allowed in the infusion area. 

## 2021-11-08 ENCOUNTER — Telehealth: Payer: Self-pay

## 2021-11-08 NOTE — Telephone Encounter (Signed)
Pt states she spoke with someone on 11/06/21 right after her office visit and requested a refill oh Hycodan.

## 2021-11-10 ENCOUNTER — Other Ambulatory Visit: Payer: Self-pay | Admitting: Internal Medicine

## 2021-11-10 DIAGNOSIS — C3490 Malignant neoplasm of unspecified part of unspecified bronchus or lung: Secondary | ICD-10-CM

## 2021-11-10 MED ORDER — HYDROCODONE BIT-HOMATROP MBR 5-1.5 MG/5ML PO SOLN: 5.0000 mL | Freq: Four times a day (QID) | ORAL | 0 refills | Status: DC | PRN

## 2021-11-12 ENCOUNTER — Other Ambulatory Visit: Payer: Self-pay

## 2021-11-25 ENCOUNTER — Other Ambulatory Visit: Payer: Self-pay

## 2021-11-25 ENCOUNTER — Emergency Department (HOSPITAL_COMMUNITY): Payer: Medicare Other

## 2021-11-25 ENCOUNTER — Encounter (HOSPITAL_COMMUNITY): Payer: Self-pay

## 2021-11-25 ENCOUNTER — Emergency Department (HOSPITAL_COMMUNITY)
Admission: EM | Admit: 2021-11-25 | Discharge: 2021-11-25 | Disposition: A | Discharge status: Home / Self Care | Payer: Medicare Other | Attending: Emergency Medicine | Admitting: Emergency Medicine

## 2021-11-25 DIAGNOSIS — Z85118 Personal history of other malignant neoplasm of bronchus and lung: Secondary | ICD-10-CM | POA: Diagnosis not present

## 2021-11-25 DIAGNOSIS — R0602 Shortness of breath: Secondary | ICD-10-CM | POA: Diagnosis present

## 2021-11-25 DIAGNOSIS — J45909 Unspecified asthma, uncomplicated: Secondary | ICD-10-CM | POA: Insufficient documentation

## 2021-11-25 DIAGNOSIS — Z79899 Other long term (current) drug therapy: Secondary | ICD-10-CM | POA: Diagnosis not present

## 2021-11-25 DIAGNOSIS — J441 Chronic obstructive pulmonary disease with (acute) exacerbation: Secondary | ICD-10-CM | POA: Diagnosis not present

## 2021-11-25 LAB — TROPONIN I (HIGH SENSITIVITY): Troponin I (High Sensitivity): 15 ng/L (ref <18)

## 2021-11-25 LAB — CBC
HCT: 39.1 % (ref 36.0–46.0)
Hemoglobin: 13.9 g/dL (ref 12.0–15.0)
MCH: 31.2 pg (ref 26.0–34.0)
MCHC: 35.5 g/dL (ref 30.0–36.0)
MCV: 87.7 fL (ref 80.0–100.0)
Platelets: 223 10*3/uL (ref 150–400)
RBC: 4.46 MIL/uL (ref 3.87–5.11)
RDW: 17.1 % — ABNORMAL HIGH (ref 11.5–15.5)
WBC: 4.8 10*3/uL (ref 4.0–10.5)
nRBC: 0 % (ref 0.0–0.2)

## 2021-11-25 LAB — BASIC METABOLIC PANEL
Anion gap: 9 (ref 5–15)
BUN: 12 mg/dL (ref 8–23)
CO2: 28 mmol/L (ref 22–32)
Calcium: 9 mg/dL (ref 8.9–10.3)
Chloride: 108 mmol/L (ref 98–111)
Creatinine, Ser: 0.47 mg/dL (ref 0.44–1.00)
GFR, Estimated: >60 mL/min (ref >60)
Glucose, Bld: 79 mg/dL (ref 70–99)
Potassium: 3.7 mmol/L (ref 3.5–5.1)
Sodium: 145 mmol/L (ref 135–145)

## 2021-11-25 LAB — BLOOD GAS, VENOUS
Acid-Base Excess: 4.1 mmol/L — ABNORMAL HIGH (ref 0.0–2.0)
Bicarbonate: 31.9 mmol/L — ABNORMAL HIGH (ref 20.0–28.0)
Drawn by: 44828
FIO2: 36 %
O2 Saturation: 65.7 %
Patient temperature: 36.5
pCO2, Ven: 61 mmHg — ABNORMAL HIGH (ref 44–60)
pH, Ven: 7.33 (ref 7.25–7.43)
pO2, Ven: 40 mmHg (ref 32–45)

## 2021-11-25 MED ORDER — AZITHROMYCIN 250 MG PO TABS
500.0000 mg | ORAL_TABLET | Freq: Once | ORAL | Status: AC
  Administered 2021-11-25: 500 mg via ORAL
  Filled 2021-11-25: qty 2

## 2021-11-25 MED ORDER — ACETAMINOPHEN 500 MG PO TABS
1000.0000 mg | ORAL_TABLET | Freq: Once | ORAL | Status: AC
  Administered 2021-11-25: 1000 mg via ORAL
  Filled 2021-11-25: qty 2

## 2021-11-25 MED ORDER — AZITHROMYCIN 250 MG PO TABS: 250.0000 mg | ORAL_TABLET | Freq: Every day | ORAL | 0 refills | Status: DC

## 2021-11-25 MED ORDER — METHYLPREDNISOLONE SODIUM SUCC 125 MG IJ SOLR
125.0000 mg | Freq: Once | INTRAMUSCULAR | Status: AC
  Administered 2021-11-25: 125 mg via INTRAVENOUS
  Filled 2021-11-25: qty 2

## 2021-11-25 MED ORDER — IPRATROPIUM-ALBUTEROL 0.5-2.5 (3) MG/3ML IN SOLN
3.0000 mL | Freq: Once | RESPIRATORY_TRACT | Status: AC
  Administered 2021-11-25: 3 mL via RESPIRATORY_TRACT
  Filled 2021-11-25: qty 3

## 2021-11-25 MED ORDER — PREDNISONE 20 MG PO TABS: 20.0000 mg | ORAL_TABLET | Freq: Every day | ORAL | 0 refills | Status: DC

## 2021-11-25 MED ORDER — GUAIFENESIN 100 MG/5ML PO LIQD
10.0000 mL | Freq: Once | ORAL | Status: AC
  Administered 2021-11-25: 10 mL via ORAL
  Filled 2021-11-25: qty 10

## 2021-11-25 NOTE — Discharge Instructions (Addendum)
Please return to the ED with any new or worsening signs or symptoms such as fevers, increased shortness of breath or chest pain Please follow-up with your PCP/oncology team for further management Please start taking 20 mg of prednisone once daily for the next 2 days.  You will start this on Tuesday 8/15 and you will end this on Wednesday 8/16. Please continue taking antibiotics I placed you on.  You will take 250 mg of azithromycin starting tomorrow for the next 4 days.

## 2021-11-25 NOTE — ED Triage Notes (Signed)
Pt presents to ED with increased SOB started this am. Pt also reports cough. Pt chronically on 4-5L of O2

## 2021-11-25 NOTE — ED Provider Notes (Signed)
[facility] EMERGENCY DEPARTMENT Provider Note   CSN: [id number] Arrival date & time: 11/25/21  1254     History  Chief Complaint  Patient presents with   Shortness of Breath    [facility] Z[facility]iga is a 71 y.o. female with extensive medical history to include anemia, asthma, chronic headaches, chronic hip pain, chronic pain, COPD chronically on 4 L of oxygen, GERD, lung cancer.  Patient presents to ED for evaluation of shortness of breath.  Patient presents to ED for evaluation of shortness of breath.  Patient reports this morning she woke up with increased shortness of breath.  The patient states that she has been using her at home inhalers to no avail, her symptoms persist.  Patient reports she is chronically on 4 L of oxygen due to her underlying COPD.  Patient also endorsing 1 episode of chest pain this morning, only associated with coughing.  Patient denies any fevers, nausea or vomiting, abdominal pain, diarrhea, lower extremity swelling.   Shortness of Breath Associated symptoms: chest pain   Associated symptoms: no abdominal pain, no fever and no vomiting        Home Medications Prior to Admission medications   Medication Sig Start Date End Date Taking? Authorizing Provider  azithromycin (ZITHROMAX) 250 MG tablet Take 1 tablet (250 mg total) by mouth daily. Take first 2 tablets together, then 1 every day until finished. 11/25/21  Yes Azucena Cecil, PA-C  predniSONE (DELTASONE) 20 MG tablet Take 1 tablet (20 mg total) by mouth daily with breakfast. 11/25/21  Yes Azucena Cecil, PA-C  acetaminophen (TYLENOL) 325 MG tablet Take 2 tablets (650 mg total) by mouth every 6 (six) hours as needed for mild pain, fever or headache. 05/08/18   Denton Brick, Courage, MD  albuterol (PROVENTIL HFA;VENTOLIN HFA) 108 (90 BASE) MCG/ACT inhaler Inhale 2 puffs into the lungs every 4 (four) hours as needed for shortness of breath.     [provider]  atorvastatin (LIPITOR) 10 MG tablet  Take 10 mg by mouth daily.    [provider]  Cholecalciferol (VITAMIN D3) 125 MCG (5000 UT) CAPS Take 1 capsule by mouth daily. 02/17/18   [provider]  cyclobenzaprine (FLEXERIL) 10 MG tablet Take 1 tablet (10 mg total) by mouth 2 (two) times daily. *MAy take one additional tablet as needed for muscle spasms Patient taking differently: Take 10 mg by mouth 2 (two) times daily. *May take one additional tablet as needed for muscle spasms 05/08/18   Roxan Hockey, MD  diclofenac sodium (VOLTAREN) 1 % GEL Apply topically 4 (four) times daily as needed (massge gel into affected area(s) as needed for pain).  08/11/17   [provider]  DULoxetine (CYMBALTA) 60 MG capsule Cymbalta 60 mg capsule,delayed release  Take 1 capsule every day by oral route.  stop 30 mg capsules    [provider]  Dupilumab (DUPIXENT) 300 MG/2ML SOPN Inject 300 mg into the skin every 14 (fourteen) days. Twice a month 03/29/20   [provider]  ENSURE (ENSURE) Take 237 mLs by mouth 3 (three) times daily between meals.    [provider]  HYDROcodone bit-homatropine (HYCODAN) 5-1.5 MG/5ML syrup Take 5 mLs by mouth every 6 (six) hours as needed for cough. 11/10/21   Curt Bears, MD  Ipratropium-Albuterol (COMBIVENT RESPIMAT) 20-100 MCG/ACT AERS respimat INHALE 2 PUFFS BY MOUTH FOUR TIMES DAILY AS NEEDED FOR WHEEZING 01/18/21   [provider]  ipratropium-albuterol (DUONEB) 0.5-2.5 (3) MG/3ML SOLN Take 3 mLs  by nebulization 3 (three) times daily. 05/08/18   Roxan Hockey, MD  metoprolol tartrate (LOPRESSOR) 25 MG tablet Take 1 tablet (25 mg total) by mouth 2 (two) times daily. 08/09/20 09/08/20  Deatra James, MD  oxyCODONE-acetaminophen (PERCOCET/ROXICET) 5-325 MG tablet Take 1 tablet by mouth every 8 (eight) hours as needed for moderate pain. 05/08/18   Roxan Hockey, MD  prochlorperazine (COMPAZINE) 10 MG tablet Take 1 tablet (10 mg total) by mouth every  6 (six) hours as needed for nausea or vomiting. 08/02/21   Curt Bears, MD  topiramate (TOPAMAX) 25 MG tablet Take 1 tablet by mouth daily at bedtime X 1 week; then increase to 25mg  BID. Patient taking differently: Take 25 mg by mouth 2 (two) times daily. 06/18/18   Barton Dubois, MD  TRELEGY ELLIPTA 100-62.5-25 MCG/ACT AEPB Inhale 1 puff into the lungs daily. 04/08/21   [provider]  Vitamin D, Ergocalciferol, (DRISDOL) 1.25 MG (50000 UNIT) CAPS capsule Take 50,000 Units by mouth once a week. 02/18/21   [provider]      Allergies    Desyrel [trazodone] and No known allergies    Review of Systems   Review of Systems  Constitutional:  Negative for fever.  Respiratory:  Positive for shortness of breath.   Cardiovascular:  Positive for chest pain. Negative for leg swelling.  Gastrointestinal:  Negative for abdominal pain, diarrhea, nausea and vomiting.  All other systems reviewed and are negative.   Physical Exam Updated Vital Signs BP 125/86   Pulse (!) 102   Temp 98 F (36.7 C)   Resp (!) 24   Ht 5\' 7"  (1.702 m)   Wt 61.7 kg   SpO2 98%   BMI 21.30 kg/m  Physical Exam Vitals and nursing note reviewed.  Constitutional:      General: She is not in acute distress.    Appearance: She is well-developed. She is not ill-appearing, toxic-appearing or diaphoretic.  HENT:     Head: Normocephalic and atraumatic.  Eyes:     Extraocular Movements: Extraocular movements intact.     Pupils: Pupils are equal, round, and reactive to light.  Cardiovascular:     Rate and Rhythm: Normal rate and regular rhythm.  Pulmonary:     Effort: Pulmonary effort is normal. Tachypnea present.     Breath sounds: Decreased breath sounds and wheezing present.  Abdominal:     Palpations: Abdomen is soft. There is no mass.     Tenderness: There is no abdominal tenderness.  Musculoskeletal:     Cervical back: Normal range of motion and neck supple.     Right lower leg: No  tenderness. No edema.     Left lower leg: No tenderness. No edema.  Skin:    General: Skin is warm and dry.     Capillary Refill: Capillary refill takes less than 2 seconds.  Neurological:     Mental Status: She is alert and oriented to person, place, and time.     ED Results / Procedures / Treatments   Labs (all labs ordered are listed, but only abnormal results are displayed) Labs Reviewed  CBC - Abnormal; Notable for the following components:      Result Value   RDW 17.1 (*)    All other components within normal limits  BLOOD GAS, VENOUS - Abnormal; Notable for the following components:   pCO2, Ven 61 (*)    Bicarbonate 31.9 (*)    Acid-Base Excess 4.1 (*)    All  other components within normal limits  BASIC METABOLIC PANEL  TROPONIN I (HIGH SENSITIVITY)    EKG EKG Interpretation  Date/Time:  Monday November 25 2021 13:15:17 EDT Ventricular Rate:  107 PR Interval:  147 QRS Duration: 76 QT Interval:  334 QTC Calculation: 446 R Axis:   74 Text Interpretation: Sinus tachycardia Biatrial enlargement Anteroseptal infarct, age indeterminate Confirmed by Godfrey Pick [phone]) on 11/25/2021 5:34:12 PM  Radiology DG Chest 2 View  Result Date: 11/25/2021 CLINICAL DATA:  cp, sob EXAM: CHEST - 2 VIEW COMPARISON:  Chest x-ray August 05, 2021. FINDINGS: Right midlung and apical atelectasis/scarring. No consolidation. No visible pleural effusions or pneumothorax. Cardiomediastinal silhouette is within normal limits. Hyperinflation, compatible with emphysema. IMPRESSION: Chronic emphysema and right midlung and apical post treatment scarring/atelectasis. No evidence of acute cardiopulmonary disease. Electronically Signed   By: Margaretha Sheffield M.D.   On: 11/25/2021 15:03    Procedures Procedures   Medications Ordered in ED Medications  azithromycin (ZITHROMAX) tablet 500 mg (has no administration in time range)  acetaminophen (TYLENOL) tablet 1,000 mg (1,000 mg Oral Given 11/25/21 1435)   ipratropium-albuterol (DUONEB) 0.5-2.5 (3) MG/3ML nebulizer solution 3 mL (3 mLs Nebulization Given 11/25/21 1431)  methylPREDNISolone sodium succinate (SOLU-MEDROL) 125 mg/2 mL injection 125 mg (125 mg Intravenous Given 11/25/21 1435)  guaiFENesin (ROBITUSSIN) 100 MG/5ML liquid 10 mL (10 mLs Oral Given 11/25/21 1435)    ED Course/ Medical Decision Making/ A&P                           Medical Decision Making Amount and/or Complexity of Data Reviewed Labs: ordered. Radiology: ordered.  Risk OTC drugs. Prescription drug management.   71 year old female presents to the ED for evaluation.  Please see HPI for further details.  On examination, the patient is afebrile and nontachycardic.  Patient lung sounds with wheezing throughout, the patient is on 4 L of oxygen via nasal cannula.  Patient abdomen soft and compressible.  Patient worked up utilizing the following labs and imaging studies interpreted by me personally: - Troponin 15, patient denies any chest pain - BMP unremarkable - CBC unremarkable - Venous blood gas shows increased bicarb to 31.9, increased acid-base of 4.1 however this patient is on chronic oxygen - Plain film imaging of chest shows no consolidation, effusion, mediastinal widening - EKG nonischemic  Patient given 1 g Tylenol for headache, Robitussin, 125 Solu-Medrol, DuoNeb.  The patient states she feels better after these were given.  The patient reports her shortness of breath is decreased.  The patient will be sent home on Z-Pak, very small steroid burst due to the fact the patient is still undergoing chemotherapy.  Patient advised to follow-up with her pulmonology team this week for further management.  Patient given return precautions and she voiced understanding.  Patient had all of her questions answered her satisfaction prior to discharge.  The patient is stable at this time for discharge home.  Final Clinical Impression(s) / ED Diagnoses Final diagnoses:   COPD exacerbation ([place])    Rx / DC Orders ED Discharge Orders          Ordered    predniSONE (DELTASONE) 20 MG tablet  Daily with breakfast        11/25/21 1741    azithromycin (ZITHROMAX) 250 MG tablet  Daily        11/25/21 1741              Azucena Cecil, PA-C  11/25/21 1744    [name], MD 11/27/21 1334

## 2021-11-28 NOTE — Progress Notes (Signed)
[facility] OFFICE PROGRESS NOTE  [place] Zuniga, [place] [address]  DIAGNOSIS: Recurrent non-small cell lung cancer initially diagnosed as stage IIIA (T1b, N2, M0) non-small cell lung cancer presented with right middle lobe pulmonary nodule, mediastinal lymphadenopathy and highly suspicious for small nodule in the left upper lobe that could change her stage to stage IV that could present another synchronous primary lesion in the left upper lobe. This was diagnosed in September 2017.  PRIOR THERAPY: 1) Concurrent chemoradiation with weekly carboplatin for AUC of 2 and paclitaxel 45 MG/M2 status post 6 cycles last dose was given 02/25/2016 with partial response. 2) Consolidation chemotherapy with carboplatin for AUC of 5 and paclitaxel 175 MG/M2 every 3 weeks with Neulasta support. First dose 04/22/2016. Status post 3 cycles. 3) Second line treatment with immunotherapy with Nivolumab 480 mg IV every 4 weeks status post 32 cycles.  Discontinued secondary to intolerance and frequent hospitalization with pneumonia and pneumonitis.  She had a break of treatment between November 24, 2019 until June 28, 2020.  CURRENT THERAPY: Resuming her treatment with immunotherapy with nivolumab 480 mg IV every 4 weeks.  Cycle #33 started on June 28, 2020.  Status post 50 cycles.  INTERVAL HISTORY: [facility] Z[facility]iga 71 y.o. female returns to the clinic for a follow up visit. She was recently seen in the ER on 11/25/21 for a COPD exacerbation.  She was treated with Solu-Medrol, DuoNeb, and Robitussin.  She was also sent home on a Z-Pak.  Since being seen in the ER, her breathing is back at its baseline.  The patient at baseline has significant emphysema for which she is on 4 L of oxygen via nasal cannula.  She is scheduled to see her pulmonologist in September.    Overall, the patient reports that she tolerates treatment well without any side effects.  Today, the patient does have a  headache due to having a history of a brain tumor in 2005.  She generally will take BC powder.  Has not taken any vacation or any Tylenol yet today.  Denies any fever, chills, night sweats, or weight loss. Denies any chest pain or hemoptysis.  She reports similar dyspnea and cough at her baseline.  Denies any nausea, vomiting, diarrhea, or constipation. Denies any rashes or skin changes. The patient is here today for evaluation and repeat blood work before starting cycle #51.   MEDICAL HISTORY: Past Medical History:  Diagnosis Date   Anemia    as a young woman   Arthritis    osteoartritis   Asthma    Brain tumor (benign) ([place]) 2005 [facility]   Benign   Chronic headaches    Chronic hip pain    Chronic pain    COPD (chronic obstructive pulmonary disease) ([facility])    Coronary artery disease    Depression    Depression 05/15/2016   Encounter for antineoplastic chemotherapy 01/10/2016   GERD (gastroesophageal reflux disease)    Hypertension    Lung cancer ([place]) dx'd 01/2016   currently on chemo and radiation    NSTEMI (non-ST elevated myocardial infarction) ([place]) yrs ago   On home O2    qhs 2 liters at hs and prn   Pneumonia last time 2 yrs ago   Shortness of breath dyspnea    with activity    ALLERGIES:  is allergic to desyrel [trazodone] and no known allergies.  MEDICATIONS:  Current Outpatient Medications  Medication Sig Dispense Refill   acetaminophen (TYLENOL)  325 MG tablet Take 2 tablets (650 mg total) by mouth every 6 (six) hours as needed for mild pain, fever or headache. 12 tablet 2   albuterol (PROVENTIL HFA;VENTOLIN HFA) 108 (90 BASE) MCG/ACT inhaler Inhale 2 puffs into the lungs every 4 (four) hours as needed for shortness of breath.      atorvastatin (LIPITOR) 10 MG tablet Take 10 mg by mouth daily.     azithromycin (ZITHROMAX) 250 MG tablet Take 1 tablet (250 mg total) by mouth daily. Take first 2 tablets together, then 1 every day until finished. 4 tablet 0    Cholecalciferol (VITAMIN D3) 125 MCG (5000 UT) CAPS Take 1 capsule by mouth daily.  0   cyclobenzaprine (FLEXERIL) 10 MG tablet Take 1 tablet (10 mg total) by mouth 2 (two) times daily. *MAy take one additional tablet as needed for muscle spasms (Patient taking differently: Take 10 mg by mouth 2 (two) times daily. *May take one additional tablet as needed for muscle spasms) 60 tablet 2   diclofenac sodium (VOLTAREN) 1 % GEL Apply topically 4 (four) times daily as needed (massge gel into affected area(s) as needed for pain).   1   DULoxetine (CYMBALTA) 60 MG capsule Cymbalta 60 mg capsule,delayed release  Take 1 capsule every day by oral route.  stop 30 mg capsules     Dupilumab (DUPIXENT) 300 MG/2ML SOPN Inject 300 mg into the skin every 14 (fourteen) days. Twice a month     ENSURE (ENSURE) Take 237 mLs by mouth 3 (three) times daily between meals.     HYDROcodone bit-homatropine (HYCODAN) 5-1.5 MG/5ML syrup Take 5 mLs by mouth every 6 (six) hours as needed for cough. 120 mL 0   Ipratropium-Albuterol (COMBIVENT RESPIMAT) 20-100 MCG/ACT AERS respimat INHALE 2 PUFFS BY MOUTH FOUR TIMES DAILY AS NEEDED FOR WHEEZING     ipratropium-albuterol (DUONEB) 0.5-2.5 (3) MG/3ML SOLN Take 3 mLs by nebulization 3 (three) times daily. 360 mL 2   metoprolol tartrate (LOPRESSOR) 25 MG tablet Take 1 tablet (25 mg total) by mouth 2 (two) times daily. 60 tablet 0   oxyCODONE-acetaminophen (PERCOCET/ROXICET) 5-325 MG tablet Take 1 tablet by mouth every 8 (eight) hours as needed for moderate pain. 12 tablet 0   predniSONE (DELTASONE) 20 MG tablet Take 1 tablet (20 mg total) by mouth daily with breakfast. 2 tablet 0   prochlorperazine (COMPAZINE) 10 MG tablet Take 1 tablet (10 mg total) by mouth every 6 (six) hours as needed for nausea or vomiting. 30 tablet 0   topiramate (TOPAMAX) 25 MG tablet Take 1 tablet by mouth daily at bedtime X 1 week; then increase to 25mg  BID. (Patient taking differently: Take 25 mg by mouth 2  (two) times daily.) 45 tablet 2   TRELEGY ELLIPTA 100-62.5-25 MCG/ACT AEPB Inhale 1 puff into the lungs daily.     Vitamin D, Ergocalciferol, (DRISDOL) 1.25 MG (50000 UNIT) CAPS capsule Take 50,000 Units by mouth once a week.     No current facility-administered medications for this visit.    SURGICAL HISTORY:  Past Surgical History:  Procedure Laterality Date   CHOLECYSTECTOMY     COLONOSCOPY  2015   Results requested from [facility]   COLONOSCOPY     ESOPHAGOGASTRODUODENOSCOPY N/A 08/14/2015   Procedure: ESOPHAGOGASTRODUODENOSCOPY (EGD);  Surgeon: Daneil Dolin, MD;  Location: [facility];  Service: Endoscopy;  Laterality: N/A;  215    ESOPHAGOGASTRODUODENOSCOPY (EGD) WITH PROPOFOL N/A 09/13/2015   Procedure: ESOPHAGOGASTRODUODENOSCOPY (EGD) WITH PROPOFOL;  Surgeon: Milus Banister, MD;  Location: [facility];  Service: Endoscopy;  Laterality: N/A;   EUS N/A 03/12/2017   Procedure: UPPER ENDOSCOPIC ULTRASOUND (EUS) RADIAL;  Surgeon: Milus Banister, MD;  Location: [facility];  Service: Endoscopy;  Laterality: N/A;   TUMOR REMOVAL  2005   Benign   UPPER ESOPHAGEAL ENDOSCOPIC ULTRASOUND (EUS)  09/13/2015   Procedure: UPPER ESOPHAGEAL ENDOSCOPIC ULTRASOUND (EUS);  Surgeon: Milus Banister, MD;  Location: [facility];  Service: Endoscopy;;   VIDEO BRONCHOSCOPY WITH ENDOBRONCHIAL NAVIGATION N/A 12/31/2015   Procedure: VIDEO BRONCHOSCOPY WITH ENDOBRONCHIAL NAVIGATION;  Surgeon: Melrose Nakayama, MD;  Location: [facility];  Service: Thoracic;  Laterality: N/A;   VIDEO BRONCHOSCOPY WITH ENDOBRONCHIAL ULTRASOUND N/A 11/08/2015   Procedure: VIDEO BRONCHOSCOPY WITH ENDOBRONCHIAL ULTRASOUND;  Surgeon: Ivin Poot, MD;  Location: [facility] OR;  Service: Thoracic;  Laterality: N/A;    REVIEW OF SYSTEMS:   Review of Systems  Constitutional: Positive for baseline fatigue.  Negative for appetite change, chills, fatigue, fever and unexpected weight change.  HENT:   Negative for mouth sores,  nosebleeds, sore throat and trouble swallowing.   Eyes: Negative for eye problems and icterus.  Respiratory: Positive for baseline dyspnea.  Negative for hemoptysis, shortness of breath and wheezing.   Cardiovascular: Negative for chest pain and leg swelling.  Gastrointestinal: Negative for abdominal pain, constipation, diarrhea, nausea and vomiting.  Genitourinary: Negative for bladder incontinence, difficulty urinating, dysuria, frequency and hematuria.   Musculoskeletal: Negative for back pain, gait problem, neck pain and neck stiffness.  Skin: Negative for itching and rash.  Neurological: Positive for headache.  Negative for dizziness, extremity weakness, gait problem, light-headedness and seizures.  Hematological: Negative for adenopathy. Does not bruise/bleed easily.  Psychiatric/Behavioral: Negative for confusion, depression and sleep disturbance. The patient is not nervous/anxious.     PHYSICAL EXAMINATION:  There were no vitals taken for this visit.  ECOG PERFORMANCE STATUS: 1-2  Physical Exam  Constitutional: Oriented to person, place, and time and w thin appearing female and in no distress.  HENT:  Head: Normocephalic and atraumatic.  Mouth/Throat: Oropharynx is clear and moist. No oropharyngeal exudate.  Eyes: Conjunctivae are normal. Right eye exhibits no discharge. Left eye exhibits no discharge. No scleral icterus.  Neck: Normal range of motion. Neck supple.  Cardiovascular: Normal rate, regular rhythm, normal heart sounds and intact distal pulses.   Pulmonary/Chest: Effort normal.  Quiet breath sounds in all lung fields.  No respiratory distress. No wheezes. No rales.  Abdominal: Soft. Bowel sounds are normal. Exhibits no distension and no mass. There is no tenderness.  Musculoskeletal: Normal range of motion. Exhibits no edema.  Lymphadenopathy:    No cervical adenopathy.  Neurological: Left facial twitching and weakness (baseline from brain tumor in  2005-stable/chronic). Alert and oriented to person, place, and time.  Exhibits muscle wasting.  The patient was examined in the wheelchair.  Skin: Skin is warm and dry. No rash noted. Not diaphoretic. No erythema. No pallor.  Psychiatric: Mood, memory and judgment normal.  Vitals reviewed.  LABORATORY DATA: Lab Results  Component Value Date   WBC 4.8 11/25/2021   HGB 13.9 11/25/2021   HCT 39.1 11/25/2021   MCV 87.7 11/25/2021   PLT 223 11/25/2021      Chemistry      Component Value Date/Time   NA 145 11/25/2021 1551   NA 144 04/09/2017 0948   K 3.7 11/25/2021 1551   K 4.3 04/09/2017 0948   CL 108 11/25/2021 1551  CO2 28 11/25/2021 1551   CO2 25 04/09/2017 0948   BUN 12 11/25/2021 1551   BUN 16.8 04/09/2017 0948   CREATININE 0.47 11/25/2021 1551   CREATININE 0.51 11/06/2021 0841   CREATININE 0.8 04/09/2017 0948      Component Value Date/Time   CALCIUM 9.0 11/25/2021 1551   CALCIUM 9.1 04/09/2017 0948   ALKPHOS 252 (H) 11/06/2021 0841   ALKPHOS 216 (H) 04/09/2017 0948   AST 21 11/06/2021 0841   AST 12 04/09/2017 0948   ALT 9 11/06/2021 0841   ALT 13 04/09/2017 0948   BILITOT 0.3 11/06/2021 0841   BILITOT 0.22 04/09/2017 0948       RADIOGRAPHIC STUDIES:  DG Chest 2 View  Result Date: 11/25/2021 CLINICAL DATA:  cp, sob EXAM: CHEST - 2 VIEW COMPARISON:  Chest x-ray August 05, 2021. FINDINGS: Right midlung and apical atelectasis/scarring. No consolidation. No visible pleural effusions or pneumothorax. Cardiomediastinal silhouette is within normal limits. Hyperinflation, compatible with emphysema. IMPRESSION: Chronic emphysema and right midlung and apical post treatment scarring/atelectasis. No evidence of acute cardiopulmonary disease. Electronically Signed   By: Margaretha Sheffield M.D.   On: 11/25/2021 15:03     ASSESSMENT/PLAN:  This is a very pleasant 71 year old African-American female with recurrent metastatic non-small cell lung cancer, adenocarcinoma of the right  middle lobe.  She was initially diagnosed as stage IIIa in September 2017.     She completed concurrent chemoradiation followed by consolidation with carboplatin and paclitaxel. She is status post 3 cycles. The patient was on observation but a restaging CT scan showed evidence of disease progression.   She is currently undergoing treatment with second line with immunotherapy with nivolumab 480 mg IV every 4 weeks.  She was status post 31 cycles. Her treatment had been on hold since August 2021 until her COPD can get under control. She showed evidence of some recurrent disease in March 2022 and she resumed her treatment with cycle number cycle #33 of single agent nivolumab 480 mg IV every 4 weeks in March 2022 status post 50 cycles.    Labs were reviewed.  Recommend that she proceed with cycle #51 today as scheduled.    We will see her back for a follow up visit in 3 weeks before starting cycle #52.   At her next appointment, we will discuss arranging for restaging CT scan at that time.   We will arrange for her to receive 650 mg of Tylenol while in the infusion room today for her headache.   For the history of COPD, she will continue on home oxygen and nebulizer as prescribed by her primary care physician and pulmonologist.  She reports her breathing is back at its baseline since being seen in the emergency room last week.   The patient was advised to call immediately if she has any concerning symptoms in the interval. The patient voices understanding of current disease status and treatment options and is in agreement with the current care plan. All questions were answered. The patient knows to call the clinic with any problems, questions or concerns. We can certainly see the patient much sooner if necessary      No orders of the defined types were placed in this encounter.    The total time spent in the appointment was 20-29 minutes   Shaden Higley L Darnelle Derrick, PA-C 11/28/21

## 2021-12-04 ENCOUNTER — Inpatient Hospital Stay: Payer: Medicare Other | Attending: Physician Assistant | Admitting: Physician Assistant

## 2021-12-04 ENCOUNTER — Inpatient Hospital Stay: Payer: Medicare Other

## 2021-12-04 ENCOUNTER — Other Ambulatory Visit: Payer: Self-pay

## 2021-12-04 VITALS — BP 108/71 | HR 92 | Temp 97.9°F | Resp 16 | Wt 126.0 lb

## 2021-12-04 DIAGNOSIS — C3491 Malignant neoplasm of unspecified part of right bronchus or lung: Secondary | ICD-10-CM

## 2021-12-04 DIAGNOSIS — R519 Headache, unspecified: Secondary | ICD-10-CM | POA: Insufficient documentation

## 2021-12-04 DIAGNOSIS — Z5112 Encounter for antineoplastic immunotherapy: Secondary | ICD-10-CM | POA: Diagnosis present

## 2021-12-04 DIAGNOSIS — C3490 Malignant neoplasm of unspecified part of unspecified bronchus or lung: Secondary | ICD-10-CM

## 2021-12-04 DIAGNOSIS — C342 Malignant neoplasm of middle lobe, bronchus or lung: Secondary | ICD-10-CM | POA: Insufficient documentation

## 2021-12-04 DIAGNOSIS — Z79899 Other long term (current) drug therapy: Secondary | ICD-10-CM | POA: Diagnosis not present

## 2021-12-04 LAB — CMP (CANCER CENTER ONLY)
ALT: 10 U/L (ref 0–44)
AST: 19 U/L (ref 15–41)
Albumin: 4 g/dL (ref 3.5–5.0)
Alkaline Phosphatase: 254 U/L — ABNORMAL HIGH (ref 38–126)
Anion gap: 4 — ABNORMAL LOW (ref 5–15)
BUN: 16 mg/dL (ref 8–23)
CO2: 36 mmol/L — ABNORMAL HIGH (ref 22–32)
Calcium: 9.3 mg/dL (ref 8.9–10.3)
Chloride: 105 mmol/L (ref 98–111)
Creatinine: 0.43 mg/dL — ABNORMAL LOW (ref 0.44–1.00)
GFR, Estimated: >60 mL/min (ref >60)
Glucose, Bld: 74 mg/dL (ref 70–99)
Potassium: 4.2 mmol/L (ref 3.5–5.1)
Sodium: 145 mmol/L (ref 135–145)
Total Bilirubin: 0.2 mg/dL — ABNORMAL LOW (ref 0.3–1.2)
Total Protein: 6.4 g/dL — ABNORMAL LOW (ref 6.5–8.1)

## 2021-12-04 LAB — CBC WITH DIFFERENTIAL (CANCER CENTER ONLY)
Abs Immature Granulocytes: 0.03 10*3/uL (ref 0.00–0.07)
Basophils Absolute: 0 10*3/uL (ref 0.0–0.1)
Basophils Relative: 1 %
Eosinophils Absolute: 0.1 10*3/uL (ref 0.0–0.5)
Eosinophils Relative: 2 %
HCT: 37.3 % (ref 36.0–46.0)
Hemoglobin: 13 g/dL (ref 12.0–15.0)
Immature Granulocytes: 1 %
Lymphocytes Relative: 21 %
Lymphs Abs: 1 10*3/uL (ref 0.7–4.0)
MCH: 30.2 pg (ref 26.0–34.0)
MCHC: 34.9 g/dL (ref 30.0–36.0)
MCV: 86.7 fL (ref 80.0–100.0)
Monocytes Absolute: 0.4 10*3/uL (ref 0.1–1.0)
Monocytes Relative: 8 %
Neutro Abs: 3.4 10*3/uL (ref 1.7–7.7)
Neutrophils Relative %: 67 %
Platelet Count: 231 10*3/uL (ref 150–400)
RBC: 4.3 MIL/uL (ref 3.87–5.11)
RDW: 16.5 % — ABNORMAL HIGH (ref 11.5–15.5)
WBC Count: 5 10*3/uL (ref 4.0–10.5)
nRBC: 0 % (ref 0.0–0.2)

## 2021-12-04 LAB — TSH: TSH: 1.852 u[IU]/mL (ref 0.350–4.500)

## 2021-12-04 MED ORDER — ACETAMINOPHEN 325 MG PO TABS
650.0000 mg | ORAL_TABLET | Freq: Once | ORAL | Status: AC
  Administered 2021-12-04: 650 mg via ORAL
  Filled 2021-12-04: qty 2

## 2021-12-04 MED ORDER — SODIUM CHLORIDE 0.9 % IV SOLN: Freq: Once | INTRAVENOUS | Status: AC

## 2021-12-04 MED ORDER — SODIUM CHLORIDE 0.9 % IV SOLN
480.0000 mg | Freq: Once | INTRAVENOUS | Status: AC
  Administered 2021-12-04: 480 mg via INTRAVENOUS
  Filled 2021-12-04: qty 48

## 2021-12-04 NOTE — Patient Instructions (Signed)
[facility] MEDICAL [facility]COLOGY   Discharge Instructions: Thank you for choosing [facility] to provide your oncology and hematology care.   If you have a lab appointment with the Cancer Center, please go directly to the Cancer Center and check in at the registration area.   Wear comfortable clothing and clothing appropriate for easy access to any Portacath or PICC line.   We strive to give you quality time with your provider. You may need to reschedule your appointment if you arrive late (15 or more minutes).  Arriving late affects you and other patients whose appointments are after yours.  Also, if you miss three or more appointments without notifying the office, you may be dismissed from the clinic at the provider's discretion.      For prescription refill requests, have your pharmacy contact our office and allow 72 hours for refills to be completed.    Today you received the following chemotherapy and/or immunotherapy agents: nivolumab      To help prevent nausea and vomiting after your treatment, we encourage you to take your nausea medication as directed.  BELOW ARE SYMPTOMS THAT SHOULD BE REPORTED IMMEDIATELY: *FEVER GREATER THAN 100.4 F (38 C) OR HIGHER *CHILLS OR SWEATING *NAUSEA AND VOMITING THAT IS NOT CONTROLLED WITH YOUR NAUSEA MEDICATION *UNUSUAL SHORTNESS OF BREATH *UNUSUAL BRUISING OR BLEEDING *URINARY PROBLEMS (pain or burning when urinating, or frequent urination) *BOWEL PROBLEMS (unusual diarrhea, constipation, pain near the anus) TENDERNESS IN MOUTH AND THROAT WITH OR WITHOUT PRESENCE OF ULCERS (sore throat, sores in mouth, or a toothache) UNUSUAL RASH, SWELLING OR PAIN  UNUSUAL VAGINAL DISCHARGE OR ITCHING   Items with * indicate a potential emergency and should be followed up as soon as possible or go to the Emergency Department if any problems should occur.  Please show the CHEMOTHERAPY ALERT CARD or IMMUNOTHERAPY ALERT CARD at check-in  to the Emergency Department and triage nurse.  Should you have questions after your visit or need to cancel or reschedule your appointment, please contact [facility]  Dept: [phone]  and follow the prompts.  Office hours are 8:00 a.m. to 4:30 p.m. Monday - Friday. Please note that voicemails left after 4:00 p.m. may not be returned until the following business day.  We are closed weekends and major holidays. You have access to a nurse at all times for urgent questions. Please call the main number to the clinic Dept: [phone] and follow the prompts.   For any non-urgent questions, you may also contact your provider using MyChart. We now offer e-Visits for anyone 18 and older to request care online for non-urgent symptoms. For details visit [email or web address].   Also download the MyChart app! Go to the app store, search "MyChart", open the app, select [facility], and log in with your MyChart username and password.  Masks are optional in the cancer centers. If you would like for your care team to wear a mask while they are taking care of you, please let them know. For doctor visits, patients may have with them one support Nery Kalisz who is at least 72 years old. At this time, visitors are not allowed in the infusion area. 

## 2021-12-05 ENCOUNTER — Other Ambulatory Visit: Payer: Self-pay

## 2021-12-06 ENCOUNTER — Other Ambulatory Visit: Payer: Self-pay

## 2021-12-12 DIAGNOSIS — Z5112 Encounter for antineoplastic immunotherapy: Secondary | ICD-10-CM | POA: Diagnosis not present

## 2021-12-14 ENCOUNTER — Encounter (HOSPITAL_COMMUNITY): Payer: Self-pay

## 2021-12-14 ENCOUNTER — Emergency Department (HOSPITAL_COMMUNITY)
Admission: EM | Admit: 2021-12-14 | Discharge: 2021-12-14 | Disposition: A | Discharge status: Home / Self Care | Payer: Medicare Other | Attending: Emergency Medicine | Admitting: Emergency Medicine

## 2021-12-14 ENCOUNTER — Emergency Department (HOSPITAL_COMMUNITY): Payer: Medicare Other

## 2021-12-14 ENCOUNTER — Other Ambulatory Visit: Payer: Self-pay

## 2021-12-14 DIAGNOSIS — J449 Chronic obstructive pulmonary disease, unspecified: Secondary | ICD-10-CM | POA: Diagnosis not present

## 2021-12-14 DIAGNOSIS — Z7952 Long term (current) use of systemic steroids: Secondary | ICD-10-CM | POA: Diagnosis not present

## 2021-12-14 DIAGNOSIS — J45909 Unspecified asthma, uncomplicated: Secondary | ICD-10-CM | POA: Diagnosis not present

## 2021-12-14 DIAGNOSIS — Z85118 Personal history of other malignant neoplasm of bronchus and lung: Secondary | ICD-10-CM | POA: Diagnosis not present

## 2021-12-14 DIAGNOSIS — J029 Acute pharyngitis, unspecified: Secondary | ICD-10-CM | POA: Insufficient documentation

## 2021-12-14 DIAGNOSIS — Z79899 Other long term (current) drug therapy: Secondary | ICD-10-CM | POA: Insufficient documentation

## 2021-12-14 DIAGNOSIS — I2510 Atherosclerotic heart disease of native coronary artery without angina pectoris: Secondary | ICD-10-CM | POA: Diagnosis not present

## 2021-12-14 DIAGNOSIS — I10 Essential (primary) hypertension: Secondary | ICD-10-CM | POA: Insufficient documentation

## 2021-12-14 DIAGNOSIS — R519 Headache, unspecified: Secondary | ICD-10-CM | POA: Diagnosis not present

## 2021-12-14 DIAGNOSIS — Z20822 Contact with and (suspected) exposure to covid-19: Secondary | ICD-10-CM | POA: Insufficient documentation

## 2021-12-14 DIAGNOSIS — R531 Weakness: Secondary | ICD-10-CM | POA: Diagnosis not present

## 2021-12-14 DIAGNOSIS — R0981 Nasal congestion: Secondary | ICD-10-CM | POA: Diagnosis not present

## 2021-12-14 LAB — CBC
HCT: 36.7 % (ref 36.0–46.0)
Hemoglobin: 12.3 g/dL (ref 12.0–15.0)
MCH: 29 pg (ref 26.0–34.0)
MCHC: 33.5 g/dL (ref 30.0–36.0)
MCV: 86.6 fL (ref 80.0–100.0)
Platelets: 213 10*3/uL (ref 150–400)
RBC: 4.24 MIL/uL (ref 3.87–5.11)
RDW: 16.5 % — ABNORMAL HIGH (ref 11.5–15.5)
WBC: 4.3 10*3/uL (ref 4.0–10.5)
nRBC: 0 % (ref 0.0–0.2)

## 2021-12-14 LAB — BASIC METABOLIC PANEL
Anion gap: 8 (ref 5–15)
BUN: 22 mg/dL (ref 8–23)
CO2: 30 mmol/L (ref 22–32)
Calcium: 9.3 mg/dL (ref 8.9–10.3)
Chloride: 104 mmol/L (ref 98–111)
Creatinine, Ser: 0.57 mg/dL (ref 0.44–1.00)
GFR, Estimated: >60 mL/min (ref >60)
Glucose, Bld: 87 mg/dL (ref 70–99)
Potassium: 3.8 mmol/L (ref 3.5–5.1)
Sodium: 142 mmol/L (ref 135–145)

## 2021-12-14 LAB — RESP PANEL BY RT-PCR (FLU A&B, COVID) ARPGX2
Influenza A by PCR: NEGATIVE
Influenza B by PCR: NEGATIVE
SARS Coronavirus 2 by RT PCR: NEGATIVE

## 2021-12-14 MED ORDER — DOXYCYCLINE HYCLATE 100 MG PO CAPS: 100.0000 mg | ORAL_CAPSULE | Freq: Two times a day (BID) | ORAL | 0 refills | Status: DC

## 2021-12-14 MED ORDER — ACETAMINOPHEN 500 MG PO TABS
1000.0000 mg | ORAL_TABLET | Freq: Once | ORAL | Status: AC
  Administered 2021-12-14: 1000 mg via ORAL
  Filled 2021-12-14: qty 2

## 2021-12-14 MED ORDER — DOXYCYCLINE HYCLATE 100 MG PO TABS
100.0000 mg | ORAL_TABLET | Freq: Once | ORAL | Status: AC
  Administered 2021-12-14: 100 mg via ORAL
  Filled 2021-12-14: qty 1

## 2021-12-14 NOTE — ED Provider Notes (Cosign Needed Addendum)
[facility] EMERGENCY DEPARTMENT Provider Note   CSN: [id number] Arrival date & time: 12/14/21  2012     History  Chief Complaint  Patient presents with   Weakness    Courtney Zuniga is a 71 y.o. female with history of HTN, asthma, chronic pain, CAD, COPD on home oxygen 2L, NSTEMI, GERD, and lung cancer currently on chemotherapy and radiation who presents to the emergency department via EMS for headache, generalized weakness, sinus congestion, and sore throat.  States that she has been coughing, but no more than usual.  Has not been feeling more short of breath, and has not required any more than her normal home oxygen.  Denies any chest pain, abdominal pain.  Denies nausea, vomiting, diarrhea.  Denies urinary symptoms.  HPI     Home Medications Prior to Admission medications   Medication Sig Start Date End Date Taking? Authorizing Provider  acetaminophen (TYLENOL) 325 MG tablet Take 2 tablets (650 mg total) by mouth every 6 (six) hours as needed for mild pain, fever or headache. 05/08/18   Denton Brick, Courage, MD  albuterol (PROVENTIL HFA;VENTOLIN HFA) 108 (90 BASE) MCG/ACT inhaler Inhale 2 puffs into the lungs every 4 (four) hours as needed for shortness of breath.     [provider]  atorvastatin (LIPITOR) 10 MG tablet Take 10 mg by mouth daily.    [provider]  azithromycin (ZITHROMAX) 250 MG tablet Take 1 tablet (250 mg total) by mouth daily. Take first 2 tablets together, then 1 every day until finished. 11/25/21   Azucena Cecil, PA-C  Cholecalciferol (VITAMIN D3) 125 MCG (5000 UT) CAPS Take 1 capsule by mouth daily. 02/17/18   [provider]  cyclobenzaprine (FLEXERIL) 10 MG tablet Take 1 tablet (10 mg total) by mouth 2 (two) times daily. *MAy take one additional tablet as needed for muscle spasms Patient taking differently: Take 10 mg by mouth 2 (two) times daily. *May take one additional tablet as needed for muscle spasms 05/08/18   Roxan Hockey, MD  diclofenac sodium (VOLTAREN) 1 % GEL Apply topically 4 (four) times daily as needed (massge gel into affected area(s) as needed for pain).  08/11/17   [provider]  DULoxetine (CYMBALTA) 60 MG capsule Cymbalta 60 mg capsule,delayed release  Take 1 capsule every day by oral route.  stop 30 mg capsules    [provider]  Dupilumab (DUPIXENT) 300 MG/2ML SOPN Inject 300 mg into the skin every 14 (fourteen) days. Twice a month 03/29/20   [provider]  ENSURE (ENSURE) Take 237 mLs by mouth 3 (three) times daily between meals.    [provider]  HYDROcodone bit-homatropine (HYCODAN) 5-1.5 MG/5ML syrup Take 5 mLs by mouth every 6 (six) hours as needed for cough. 11/10/21   Curt Bears, MD  Ipratropium-Albuterol (COMBIVENT RESPIMAT) 20-100 MCG/ACT AERS respimat INHALE 2 PUFFS BY MOUTH FOUR TIMES DAILY AS NEEDED FOR WHEEZING 01/18/21   [provider]  ipratropium-albuterol (DUONEB) 0.5-2.5 (3) MG/3ML SOLN Take 3 mLs by nebulization 3 (three) times daily. 05/08/18   Roxan Hockey, MD  metoprolol tartrate (LOPRESSOR) 25 MG tablet Take 1 tablet (25 mg total) by mouth 2 (two) times daily. 08/09/20 09/08/20  Deatra James, MD  oxyCODONE-acetaminophen (PERCOCET/ROXICET) 5-325 MG tablet Take 1 tablet by mouth every 8 (eight) hours as needed for moderate pain. 05/08/18   Roxan Hockey, MD  predniSONE (DELTASONE) 20 MG tablet Take 1 tablet (20 mg total) by mouth daily with breakfast. 11/25/21  Azucena Cecil, PA-C  prochlorperazine (COMPAZINE) 10 MG tablet Take 1 tablet (10 mg total) by mouth every 6 (six) hours as needed for nausea or vomiting. 08/02/21   Curt Bears, MD  topiramate (TOPAMAX) 25 MG tablet Take 1 tablet by mouth daily at bedtime X 1 week; then increase to 25mg  BID. Patient taking differently: Take 25 mg by mouth 2 (two) times daily. 06/18/18   Barton Dubois, MD  TRELEGY ELLIPTA 100-62.5-25 MCG/ACT AEPB Inhale 1 puff  into the lungs daily. 04/08/21   [provider]  Vitamin D, Ergocalciferol, (DRISDOL) 1.25 MG (50000 UNIT) CAPS capsule Take 50,000 Units by mouth once a week. 02/18/21   [provider]      Allergies    Desyrel [trazodone] and No known allergies    Review of Systems   Review of Systems  Constitutional:  Positive for appetite change. Negative for chills and fever.  HENT:  Positive for congestion and sore throat.   Respiratory:  Positive for cough. Negative for shortness of breath.   Cardiovascular:  Negative for chest pain.  Gastrointestinal:  Negative for abdominal pain, constipation, diarrhea, nausea and vomiting.  Genitourinary:  Negative for dysuria, hematuria and urgency.  Neurological:  Positive for weakness.  All other systems reviewed and are negative.   Physical Exam Updated Vital Signs BP 131/76   Pulse 96   Temp 98.6 F (37 C) (Oral)   Resp (!) 34   Ht 5\' 7"  (1.702 m)   Wt 61.7 kg   SpO2 98%   BMI 21.30 kg/m  Physical Exam Vitals and nursing note reviewed.  Constitutional:      Appearance: Normal appearance. She is not ill-appearing.     Comments: Frail in appearance  HENT:     Head: Normocephalic and atraumatic.  Eyes:     Conjunctiva/sclera: Conjunctivae normal.  Cardiovascular:     Rate and Rhythm: Normal rate and regular rhythm.  Pulmonary:     Effort: Pulmonary effort is normal. No respiratory distress.     Breath sounds: Normal breath sounds.     Comments: On 2 L [place] Abdominal:     General: There is no distension.     Palpations: Abdomen is soft.     Tenderness: There is no abdominal tenderness.  Musculoskeletal:     Right lower leg: No edema.     Left lower leg: No edema.  Skin:    General: Skin is warm and dry.  Neurological:     General: No focal deficit present.     Mental Status: She is alert.     ED Results / Procedures / Treatments   Labs (all labs ordered are listed, but only abnormal results are  displayed) Labs Reviewed  CBC - Abnormal; Notable for the following components:      Result Value   RDW 16.5 (*)    All other components within normal limits  RESP PANEL BY RT-PCR (FLU A&B, COVID) ARPGX2  BASIC METABOLIC PANEL    EKG None  Radiology DG Chest 2 View  Result Date: 12/14/2021 CLINICAL DATA:  Shortness of breath EXAM: CHEST - 2 VIEW COMPARISON:  11/25/2021 FINDINGS: The heart size and mediastinal contours are within normal limits. Emphysema and pulmonary hyperinflation. Unchanged post treatment appearance of the perihilar right lung. Mild, diffuse bilateral interstitial opacity. The visualized skeletal structures are unremarkable. IMPRESSION: 1. Mild, diffuse bilateral interstitial pulmonary opacity, which may reflect edema or infection superimposed upon emphysema. 2.  Unchanged post treatment appearance of  the perihilar right lung. Electronically Signed   By: Delanna Ahmadi M.D.   On: 12/14/2021 21:47    Procedures Procedures    Medications Ordered in ED Medications  acetaminophen (TYLENOL) tablet 1,000 mg (1,000 mg Oral Given 12/14/21 2143)    ED Course/ Medical Decision Making/ A&P                           Medical Decision Making Amount and/or Complexity of Data Reviewed Labs: ordered. Radiology: ordered.  Risk OTC drugs.  This patient is a 71 y.o. female  who presents to the ED for concern of generalized weakness, headache, congestion, and sore throat.   Differential diagnoses prior to evaluation: The emergent differential diagnosis includes, but is not limited to,  Neurologic causes (GBS, CVA, MS, ALS, spinal cord injury); ACS, Arrhythmia, syncope, orthostatic hypotension, sepsis, hypoglycemia, electrolyte disturbance, hypothyroidism, respiratory failure, anemia, dehydration, heat injury, polypharmacy, malignancy. This is not an exhaustive differential.   Past Medical History / Co-morbidities: HTN, asthma, chronic pain, CAD, COPD on home oxygen 2L, NSTEMI,  GERD, and lung cancer currently on chemotherapy and radiation  Additional history: Chart reviewed. Pertinent results include: last chemo infusion on 8/23  Physical Exam: Physical exam performed. The pertinent findings include: Afebrile, initially mildly tachycardic this is resolved.  No increased work of breathing.  Normal oxygen saturation on home 2 L.  Abdomen soft, nontender.  Lab Tests/Imaging studies: I personally interpreted labs/imaging and the pertinent results include: No leukocytosis, normal hemoglobin.  BMP unremarkable. Respiratory panel negative for COVID and flu.   Chest x-ray with mild diffuse bilateral interstitial pulmonary opacity, and unchanged posttreatment appearance perihilar right lung.  Compared this with patient's previous chest x-ray from 8/14 and I agree with the radiologist interpretation.  Medications: I ordered medication including tylenol for headache.  I have reviewed the patients home medicines and have made adjustments as needed.   Disposition: After consideration of the diagnostic results and the patients response to treatment, I feel that emergency department workup does not suggest an emergent condition requiring admission or immediate intervention beyond what has been performed at this time. The plan is: discharge to home with antibiotics for possible pneumonia. Negative viral testing but concern with some increased opacity on CXR. Does not meet sepsis criteria. The patient is safe for discharge and has been instructed to return immediately for worsening symptoms, change in symptoms or any other concerns.  I discussed this case with my attending physician Dr. Sabra Heck who cosigned this note including patient's presenting symptoms, physical exam, and planned diagnostics and interventions. Attending physician stated agreement with plan or made changes to plan which were implemented.   Final Clinical Impression(s) / ED Diagnoses Final diagnoses:  Weakness   Acute nonintractable headache, unspecified headache type    Rx / DC Orders ED Discharge Orders     None      Portions of this report may have been transcribed using voice recognition software. Every effort was made to ensure accuracy; however, inadvertent computerized transcription errors may be present.    Estill Cotta 12/14/21 2311    Noemi Chapel, MD 12/15/21 [phone]

## 2021-12-14 NOTE — Discharge Instructions (Addendum)
You were seen in the emergency department today for headache and weakness.  Based on your labs as well as your chest x-ray, I have concerned that you could be developing pneumonia.  You tested negative for COVID and flu.  I did like to give you some antibiotics to cover for possible pneumonia, and we have given you the first dose here.  I have sent the rest the rest your pharmacy.  Please make sure that you are staying well-hydrated.  Please follow-up with your primary doctor and your oncologist as soon as possible so they can make sure that you are improving.  Continue to monitor how you're doing and return to the ER for new or worsening symptoms.

## 2021-12-14 NOTE — ED Triage Notes (Signed)
Pt arrived from home via [facility] EMS w  c/o head ache 10/10, generalized weakness, sinus congestion and sore throat.

## 2022-01-01 ENCOUNTER — Other Ambulatory Visit: Payer: Self-pay

## 2022-01-01 ENCOUNTER — Other Ambulatory Visit: Payer: Self-pay | Admitting: Medical Oncology

## 2022-01-01 ENCOUNTER — Inpatient Hospital Stay (HOSPITAL_BASED_OUTPATIENT_CLINIC_OR_DEPARTMENT_OTHER): Payer: Medicare Other | Admitting: Internal Medicine

## 2022-01-01 ENCOUNTER — Inpatient Hospital Stay: Payer: Medicare Other | Attending: Physician Assistant

## 2022-01-01 ENCOUNTER — Inpatient Hospital Stay: Payer: Medicare Other

## 2022-01-01 ENCOUNTER — Encounter: Payer: Self-pay | Admitting: Internal Medicine

## 2022-01-01 VITALS — BP 113/69 | HR 98 | Temp 98.1°F | Resp 16 | Wt 123.1 lb

## 2022-01-01 DIAGNOSIS — C3491 Malignant neoplasm of unspecified part of right bronchus or lung: Secondary | ICD-10-CM

## 2022-01-01 DIAGNOSIS — Z5112 Encounter for antineoplastic immunotherapy: Secondary | ICD-10-CM | POA: Insufficient documentation

## 2022-01-01 DIAGNOSIS — Z79899 Other long term (current) drug therapy: Secondary | ICD-10-CM | POA: Diagnosis not present

## 2022-01-01 DIAGNOSIS — C3490 Malignant neoplasm of unspecified part of unspecified bronchus or lung: Secondary | ICD-10-CM | POA: Diagnosis not present

## 2022-01-01 DIAGNOSIS — R630 Anorexia: Secondary | ICD-10-CM

## 2022-01-01 DIAGNOSIS — C342 Malignant neoplasm of middle lobe, bronchus or lung: Secondary | ICD-10-CM | POA: Insufficient documentation

## 2022-01-01 LAB — CBC WITH DIFFERENTIAL (CANCER CENTER ONLY)
Abs Immature Granulocytes: 0.01 10*3/uL (ref 0.00–0.07)
Basophils Absolute: 0 10*3/uL (ref 0.0–0.1)
Basophils Relative: 0 %
Eosinophils Absolute: 0.1 10*3/uL (ref 0.0–0.5)
Eosinophils Relative: 2 %
HCT: 39.4 % (ref 36.0–46.0)
Hemoglobin: 13.2 g/dL (ref 12.0–15.0)
Immature Granulocytes: 0 %
Lymphocytes Relative: 21 %
Lymphs Abs: 1 10*3/uL (ref 0.7–4.0)
MCH: 28.7 pg (ref 26.0–34.0)
MCHC: 33.5 g/dL (ref 30.0–36.0)
MCV: 85.7 fL (ref 80.0–100.0)
Monocytes Absolute: 0.4 10*3/uL (ref 0.1–1.0)
Monocytes Relative: 8 %
Neutro Abs: 3.2 10*3/uL (ref 1.7–7.7)
Neutrophils Relative %: 69 %
Platelet Count: 269 10*3/uL (ref 150–400)
RBC: 4.6 MIL/uL (ref 3.87–5.11)
RDW: 16.5 % — ABNORMAL HIGH (ref 11.5–15.5)
WBC Count: 4.7 10*3/uL (ref 4.0–10.5)
nRBC: 0 % (ref 0.0–0.2)

## 2022-01-01 LAB — CMP (CANCER CENTER ONLY)
ALT: 8 U/L (ref 0–44)
AST: 21 U/L (ref 15–41)
Albumin: 4.2 g/dL (ref 3.5–5.0)
Alkaline Phosphatase: 321 U/L — ABNORMAL HIGH (ref 38–126)
Anion gap: 5 (ref 5–15)
BUN: 11 mg/dL (ref 8–23)
CO2: 34 mmol/L — ABNORMAL HIGH (ref 22–32)
Calcium: 9.2 mg/dL (ref 8.9–10.3)
Chloride: 104 mmol/L (ref 98–111)
Creatinine: 0.57 mg/dL (ref 0.44–1.00)
GFR, Estimated: >60 mL/min (ref >60)
Glucose, Bld: 85 mg/dL (ref 70–99)
Potassium: 4.3 mmol/L (ref 3.5–5.1)
Sodium: 143 mmol/L (ref 135–145)
Total Bilirubin: 0.3 mg/dL (ref 0.3–1.2)
Total Protein: 6.9 g/dL (ref 6.5–8.1)

## 2022-01-01 LAB — TSH: TSH: 2.094 u[IU]/mL (ref 0.350–4.500)

## 2022-01-01 MED ORDER — SODIUM CHLORIDE 0.9 % IV SOLN: Freq: Once | INTRAVENOUS | Status: AC

## 2022-01-01 MED ORDER — SODIUM CHLORIDE 0.9 % IV SOLN
480.0000 mg | Freq: Once | INTRAVENOUS | Status: AC
  Administered 2022-01-01: 480 mg via INTRAVENOUS
  Filled 2022-01-01: qty 48

## 2022-01-01 MED ORDER — LIDOCAINE-PRILOCAINE 2.5-2.5 % EX CREA: TOPICAL_CREAM | CUTANEOUS | 0 refills | Status: DC

## 2022-01-01 NOTE — Progress Notes (Signed)
[place] [phone]   [phone]) [phone]  OFFICE PROGRESS NOTE  [name], [place] [address]  DIAGNOSIS: Recurrent non-small cell lung cancer initially diagnosed as stage IIIA (T1b, N2, M0) non-small cell lung cancer presented with right middle lobe pulmonary nodule, mediastinal lymphadenopathy and highly suspicious for small nodule in the left upper lobe that could change her stage to stage IV that could present another synchronous primary lesion in the left upper lobe. This was diagnosed in September 2017.  PRIOR THERAPY:  1) Concurrent chemoradiation with weekly carboplatin for AUC of 2 and paclitaxel 45 MG/M2 status post 6 cycles last dose was given 02/25/2016 with partial response. 2) Consolidation chemotherapy with carboplatin for AUC of 5 and paclitaxel 175 MG/M2 every 3 weeks with Neulasta support. First dose 04/22/2016. Status post 3 cycles. 3) Second line treatment with immunotherapy with Nivolumab 480 mg IV every 4 weeks status post 32 cycles.  Discontinued secondary to intolerance and frequent hospitalization with pneumonia and pneumonitis.  She had a break of treatment between November 24, 2019 until June 28, 2020.  CURRENT THERAPY: Resuming her treatment with immunotherapy with nivolumab 480 mg IV every 4 weeks.  Cycle #33 started on June 28, 2020.  Status post 51 cycles.  INTERVAL HISTORY: Courtney Zuniga 71 y.o. female returns to the clinic today for follow-up visit.  The patient is feeling fine today with no concerning complaints except for the baseline shortness of breath and she is currently on home oxygen.  She has no chest pain but has mild cough with no hemoptysis.  She has no nausea, vomiting, diarrhea or constipation.  She has no recent weight loss or night sweats.  She has no headache or visual changes.  She is here today for evaluation before starting cycle #52.     MEDICAL HISTORY: Past Medical History:   Diagnosis Date   Anemia    as a young woman   Arthritis    osteoartritis   Asthma    Brain tumor (benign) ([place]) 2005 [facility]   Benign   Chronic headaches    Chronic hip pain    Chronic pain    COPD (chronic obstructive pulmonary disease) (HCC)    Coronary artery disease    Depression    Depression 05/15/2016   Encounter for antineoplastic chemotherapy 01/10/2016   GERD (gastroesophageal reflux disease)    Hypertension    Lung cancer ([place]) dx'd 01/2016   currently on chemo and radiation    NSTEMI (non-ST elevated myocardial infarction) ([place]) yrs ago   On home O2    qhs 2 liters at hs and prn   Pneumonia last time 2 yrs ago   Shortness of breath dyspnea    with activity    ALLERGIES:  is allergic to desyrel [trazodone] and no known allergies.  MEDICATIONS:  Current Outpatient Medications  Medication Sig Dispense Refill   acetaminophen (TYLENOL) 325 MG tablet Take 2 tablets (650 mg total) by mouth every 6 (six) hours as needed for mild pain, fever or headache. 12 tablet 2   albuterol (PROVENTIL HFA;VENTOLIN HFA) 108 (90 BASE) MCG/ACT inhaler Inhale 2 puffs into the lungs every 4 (four) hours as needed for shortness of breath.      atorvastatin (LIPITOR) 10 MG tablet Take 10 mg by mouth daily.     azithromycin (ZITHROMAX) 250 MG tablet Take 1 tablet (250 mg total) by mouth daily. Take first 2 tablets together, then 1  every day until finished. 4 tablet 0   Cholecalciferol (VITAMIN D3) 125 MCG (5000 UT) CAPS Take 1 capsule by mouth daily.  0   cyclobenzaprine (FLEXERIL) 10 MG tablet Take 1 tablet (10 mg total) by mouth 2 (two) times daily. *MAy take one additional tablet as needed for muscle spasms (Patient taking differently: Take 10 mg by mouth 2 (two) times daily. *May take one additional tablet as needed for muscle spasms) 60 tablet 2   diclofenac sodium (VOLTAREN) 1 % GEL Apply topically 4 (four) times daily as needed (massge gel into affected area(s) as needed for pain).   1    doxycycline (VIBRAMYCIN) 100 MG capsule Take 1 capsule (100 mg total) by mouth 2 (two) times daily. 20 capsule 0   DULoxetine (CYMBALTA) 60 MG capsule Cymbalta 60 mg capsule,delayed release  Take 1 capsule every day by oral route.  stop 30 mg capsules     Dupilumab (DUPIXENT) 300 MG/2ML SOPN Inject 300 mg into the skin every 14 (fourteen) days. Twice a month     ENSURE (ENSURE) Take 237 mLs by mouth 3 (three) times daily between meals.     HYDROcodone bit-homatropine (HYCODAN) 5-1.5 MG/5ML syrup Take 5 mLs by mouth every 6 (six) hours as needed for cough. 120 mL 0   Ipratropium-Albuterol (COMBIVENT RESPIMAT) 20-100 MCG/ACT AERS respimat INHALE 2 PUFFS BY MOUTH FOUR TIMES DAILY AS NEEDED FOR WHEEZING     ipratropium-albuterol (DUONEB) 0.5-2.5 (3) MG/3ML SOLN Take 3 mLs by nebulization 3 (three) times daily. 360 mL 2   metoprolol tartrate (LOPRESSOR) 25 MG tablet Take 1 tablet (25 mg total) by mouth 2 (two) times daily. 60 tablet 0   oxyCODONE-acetaminophen (PERCOCET/ROXICET) 5-325 MG tablet Take 1 tablet by mouth every 8 (eight) hours as needed for moderate pain. 12 tablet 0   predniSONE (DELTASONE) 20 MG tablet Take 1 tablet (20 mg total) by mouth daily with breakfast. 2 tablet 0   prochlorperazine (COMPAZINE) 10 MG tablet Take 1 tablet (10 mg total) by mouth every 6 (six) hours as needed for nausea or vomiting. 30 tablet 0   topiramate (TOPAMAX) 25 MG tablet Take 1 tablet by mouth daily at bedtime X 1 week; then increase to 25mg  BID. (Patient taking differently: Take 25 mg by mouth 2 (two) times daily.) 45 tablet 2   TRELEGY ELLIPTA 100-62.5-25 MCG/ACT AEPB Inhale 1 puff into the lungs daily.     Vitamin D, Ergocalciferol, (DRISDOL) 1.25 MG (50000 UNIT) CAPS capsule Take 50,000 Units by mouth once a week.     No current facility-administered medications for this visit.    SURGICAL HISTORY:  Past Surgical History:  Procedure Laterality Date   CHOLECYSTECTOMY     COLONOSCOPY  2015   Results  requested from [facility]   COLONOSCOPY     ESOPHAGOGASTRODUODENOSCOPY N/A 08/14/2015   Procedure: ESOPHAGOGASTRODUODENOSCOPY (EGD);  Surgeon: Daneil Dolin, MD;  Location: [facility];  Service: Endoscopy;  Laterality: N/A;  215    ESOPHAGOGASTRODUODENOSCOPY (EGD) WITH PROPOFOL N/A 09/13/2015   Procedure: ESOPHAGOGASTRODUODENOSCOPY (EGD) WITH PROPOFOL;  Surgeon: Milus Banister, MD;  Location: [facility];  Service: Endoscopy;  Laterality: N/A;   EUS N/A 03/12/2017   Procedure: UPPER ENDOSCOPIC ULTRASOUND (EUS) RADIAL;  Surgeon: Milus Banister, MD;  Location: [facility];  Service: Endoscopy;  Laterality: N/A;   TUMOR REMOVAL  2005   Benign   UPPER ESOPHAGEAL ENDOSCOPIC ULTRASOUND (EUS)  09/13/2015   Procedure: UPPER ESOPHAGEAL ENDOSCOPIC ULTRASOUND (EUS);  Surgeon:  Milus Banister, MD;  Location: [facility];  Service: Endoscopy;;   VIDEO BRONCHOSCOPY WITH ENDOBRONCHIAL NAVIGATION N/A 12/31/2015   Procedure: VIDEO BRONCHOSCOPY WITH ENDOBRONCHIAL NAVIGATION;  Surgeon: Melrose Nakayama, MD;  Location: [facility];  Service: Thoracic;  Laterality: N/A;   VIDEO BRONCHOSCOPY WITH ENDOBRONCHIAL ULTRASOUND N/A 11/08/2015   Procedure: VIDEO BRONCHOSCOPY WITH ENDOBRONCHIAL ULTRASOUND;  Surgeon: Ivin Poot, MD;  Location: [facility] OR;  Service: Thoracic;  Laterality: N/A;    REVIEW OF SYSTEMS:  A comprehensive review of systems was negative except for: Respiratory: positive for cough and dyspnea on exertion   PHYSICAL EXAMINATION: General appearance: alert, cooperative, fatigued, and no distress Head: Normocephalic, without obvious abnormality, atraumatic Neck: no adenopathy, no JVD, supple, symmetrical, trachea midline, and thyroid not enlarged, symmetric, no tenderness/mass/nodules Lymph nodes: Cervical, supraclavicular, and axillary nodes normal. Resp: clear to auscultation bilaterally Back: symmetric, no curvature. ROM normal. No CVA tenderness. Cardio: regular rate and rhythm, S1, S2  normal, no murmur, click, rub or gallop GI: soft, non-tender; bowel sounds normal; no masses,  no organomegaly Extremities: extremities normal, atraumatic, no cyanosis or edema  ECOG PERFORMANCE STATUS: 1 - Symptomatic but completely ambulatory  Blood pressure 113/69, pulse 98, temperature 98.1 F (36.7 C), temperature source Oral, resp. rate 16, weight 123 lb 1.6 oz (55.8 kg), SpO2 96 %.  LABORATORY DATA: Lab Results  Component Value Date   WBC 4.7 01/01/2022   HGB 13.2 01/01/2022   HCT 39.4 01/01/2022   MCV 85.7 01/01/2022   PLT 269 01/01/2022      Chemistry      Component Value Date/Time   NA 142 12/14/2021 2148   NA 144 04/09/2017 0948   K 3.8 12/14/2021 2148   K 4.3 04/09/2017 0948   CL 104 12/14/2021 2148   CO2 30 12/14/2021 2148   CO2 25 04/09/2017 0948   BUN 22 12/14/2021 2148   BUN 16.8 04/09/2017 0948   CREATININE 0.57 12/14/2021 2148   CREATININE 0.43 (L) 12/04/2021 0839   CREATININE 0.8 04/09/2017 0948      Component Value Date/Time   CALCIUM 9.3 12/14/2021 2148   CALCIUM 9.1 04/09/2017 0948   ALKPHOS 254 (H) 12/04/2021 0839   ALKPHOS 216 (H) 04/09/2017 0948   AST 19 12/04/2021 0839   AST 12 04/09/2017 0948   ALT 10 12/04/2021 0839   ALT 13 04/09/2017 0948   BILITOT 0.2 (L) 12/04/2021 0839   BILITOT 0.22 04/09/2017 0948       RADIOGRAPHIC STUDIES:-   DG Chest 2 View  Result Date: 12/14/2021 CLINICAL DATA:  Shortness of breath EXAM: CHEST - 2 VIEW COMPARISON:  11/25/2021 FINDINGS: The heart size and mediastinal contours are within normal limits. Emphysema and pulmonary hyperinflation. Unchanged post treatment appearance of the perihilar right lung. Mild, diffuse bilateral interstitial opacity. The visualized skeletal structures are unremarkable. IMPRESSION: 1. Mild, diffuse bilateral interstitial pulmonary opacity, which may reflect edema or infection superimposed upon emphysema. 2.  Unchanged post treatment appearance of the perihilar right lung.  Electronically Signed   By: Delanna Ahmadi M.D.   On: 12/14/2021 21:47     ASSESSMENT AND PLAN:  This is a very pleasant 71 years old African-American female with a recurrent non-small cell lung cancer initially diagnosed as stage IIIA non-small cell lung cancer status post concurrent chemoradiation followed by consolidation chemotherapy with carboplatin and paclitaxel for 3 cycles. The patient was followed by observation but restaging imaging studies showed evidence for disease progression. She was started on second line  treatment with immunotherapy with Nivolumab 480 mg IV every 4 weeks status post 32 cycles.   The patient has been in observation since August 2021 with no concerning complaints except for the baseline shortness of breath and intermittent headache. The patient resumed her treatment with cycle number cycle #33 of single agent nivolumab 480 mg IV every 4 weeks in March 2022 status post 51 cycles. The patient has been tolerating this treatment with immunotherapy fairly well. I recommended for her to proceed with cycle #52 today as planned. I will see her back for follow-up visit in 4 weeks for evaluation with repeat CT scan of the chest for restaging of her disease. For the history of COPD, she will continue on home oxygen and nebulizer as prescribed by her primary care physician and pulmonologist. For the IV access, I will refer the patient to IR for Port-A-Cath placement. The patient was advised to call immediately if she has any other concerning symptoms in the interval. All questions were answered. The patient knows to call the clinic with any problems, questions or concerns. We can certainly see the patient much sooner if necessary.  Disclaimer: This note was dictated with voice recognition software. Similar sounding words can inadvertently be transcribed and may not be corrected upon review.

## 2022-01-01 NOTE — Patient Instructions (Signed)
[facility]   Discharge Instructions: Thank you for choosing [facility] to provide your oncology and hematology care.   If you have a lab appointment with the Cancer Center, please go directly to the Cancer Center and check in at the registration area.   Wear comfortable clothing and clothing appropriate for easy access to any Portacath or PICC line.   We strive to give you quality time with your provider. You may need to reschedule your appointment if you arrive late (15 or more minutes).  Arriving late affects you and other patients whose appointments are after yours.  Also, if you miss three or more appointments without notifying the office, you may be dismissed from the clinic at the provider's discretion.      For prescription refill requests, have your pharmacy contact our office and allow 72 hours for refills to be completed.    Today you received the following chemotherapy and/or immunotherapy agents: nivolumab      To help prevent nausea and vomiting after your treatment, we encourage you to take your nausea medication as directed.  BELOW ARE SYMPTOMS THAT SHOULD BE REPORTED IMMEDIATELY: *FEVER GREATER THAN 100.4 F (38 C) OR HIGHER *CHILLS OR SWEATING *NAUSEA AND VOMITING THAT IS NOT CONTROLLED WITH YOUR NAUSEA MEDICATION *UNUSUAL SHORTNESS OF BREATH *UNUSUAL BRUISING OR BLEEDING *URINARY PROBLEMS (pain or burning when urinating, or frequent urination) *BOWEL PROBLEMS (unusual diarrhea, constipation, pain near the anus) TENDERNESS IN MOUTH AND THROAT WITH OR WITHOUT PRESENCE OF ULCERS (sore throat, sores in mouth, or a toothache) UNUSUAL RASH, SWELLING OR PAIN  UNUSUAL VAGINAL DISCHARGE OR ITCHING   Items with * indicate a potential emergency and should be followed up as soon as possible or go to the Emergency Department if any problems should occur.  Please show the CHEMOTHERAPY ALERT CARD or IMMUNOTHERAPY ALERT CARD at check-in  to the Emergency Department and triage nurse.  Should you have questions after your visit or need to cancel or reschedule your appointment, please contact [facility]  Dept: [phone]  and follow the prompts.  Office hours are 8:00 a.m. to 4:30 p.m. Monday - Friday. Please note that voicemails left after 4:00 p.m. may not be returned until the following business day.  We are closed weekends and major holidays. You have access to a nurse at all times for urgent questions. Please call the main number to the clinic Dept: [phone] and follow the prompts.   For any non-urgent questions, you may also contact your provider using MyChart. We now offer e-Visits for anyone 18 and older to request care online for non-urgent symptoms. For details visit [email or web address].   Also download the MyChart app! Go to the app store, search "MyChart", open the app, select [facility], and log in with your MyChart username and password.  Masks are optional in the cancer centers. If you would like for your care team to wear a mask while they are taking care of you, please let them know. For doctor visits, patients may have with them one support Ashlyne Olenick who is at least 72 years old. At this time, visitors are not allowed in the infusion area. 

## 2022-01-01 NOTE — Addendum Note (Signed)
Addended by: Ardeen Garland on: 01/01/2022 10:34 AM   Modules accepted: Orders

## 2022-01-03 LAB — T4: T4, Total: 8.3 ug/dL (ref 4.5–12.0)

## 2022-01-07 ENCOUNTER — Inpatient Hospital Stay: Payer: Medicare Other | Admitting: Dietician

## 2022-01-07 ENCOUNTER — Other Ambulatory Visit: Payer: Self-pay

## 2022-01-07 NOTE — Progress Notes (Signed)
Nutrition Assessment   Reason for Assessment: poor appetite   ASSESSMENT: 71 year old female with recurrent non-small cell lung cancer. She is currently receiving immunotherapy with nivolumab q4w (started 06/28/20). Patient is under the care of Dr. Mohamed.   Past medical history includes COPD on 4L O2, asthma, chronic pain, CAD, depression, GERD, HTN, NSTEMI  Met with patient in office. She is in a wheelchair. Patient reports ongoing shortness of breath. Says this has not gotten much better. Patient is on 4 L continuous oxygen. She reports appetite has not been good recently. Patient states she doesn't get hungry much and is often too tired to eat. She eats 3 small meals/day. Patient will either have an ensure for breakfast or sometimes her daughter will cook (eggs, oatmeal, bacon, sausage). She typically has a sandwich for lunch. Last night pt has 1/2 pork chop with creamed potatoes/gravy. Patient is drinking 3 Ensure. She is unsure of what kind. She denies nausea, vomiting, diarrhea, constipation.   Nutrition Focused Physical Exam:   Orbital Region: severe Buccal Region: severe Upper Arm Region: severe Thoracic and Lumbar Region: uta Temple Region: moderate Clavicle Bone Region: severe Shoulder and Acromion Bone Region: severe Scapular Bone Region: uta Dorsal Hand: moderate Patellar Region: uta Anterior Thigh Region: uta Posterior Calf Region: uta Edema (RD assessment): uta Hair: reviewed Eyes: reviewed Mouth: reviewed (edentulous) Skin: reviewed  Nails: reviewed    Medications: lipitor, D3, flexeril, cymbalta, hycodan, percocet, prednisone, compazine, topamax, drisdol   Labs: 9/20 labs reviewed   Anthropometrics: Weights have decreased 9.5% (13 lbs) from 136 lb on 8/14 - this is severe for time frame  Height: 5'7" Weight: 123 lb 1.6 UBW: 150-160 lb (per pt prior to initial diagnosis) BMI: 19.28   MALNUTRITION DIAGNOSIS: Severe malnutrition related to chronic illness  (COPD, recurrent non-small cell lung cancer) as evidenced by severe fat/muscle depletion, 9.5% weight loss in one month which is severe   INTERVENTION:  Educated on soft moist high protein foods for ease of intake - handout with ideas provided Discussed ways to add calories to foods, making most of every bite (using milk in oatmeal/soups, adding cheese to foods, cooking with butter, adding gravy) Continue drinking Ensure TID, suggested switching to Ensure Plus/equivalent for more calories/protein and drinking in between meals and one at bedtime One complimentary case of Ensure Plus HP provided  Contact information given    MONITORING, EVALUATION, GOAL: Patient will tolerate increased calories and protein to minimize further weight loss    Next Visit: Wednesday November 15 during infusion       

## 2022-01-13 ENCOUNTER — Other Ambulatory Visit: Payer: Self-pay

## 2022-01-17 ENCOUNTER — Other Ambulatory Visit: Payer: Self-pay | Admitting: Radiology

## 2022-01-20 ENCOUNTER — Encounter (HOSPITAL_COMMUNITY): Payer: Self-pay

## 2022-01-20 ENCOUNTER — Other Ambulatory Visit: Payer: Self-pay

## 2022-01-20 ENCOUNTER — Ambulatory Visit (HOSPITAL_COMMUNITY)
Admission: RE | Admit: 2022-01-20 | Discharge: 2022-01-20 | Disposition: A | Discharge status: Home / Self Care | Payer: Medicare Other | Source: Ambulatory Visit | Attending: Internal Medicine | Admitting: Internal Medicine

## 2022-01-20 DIAGNOSIS — F32A Depression, unspecified: Secondary | ICD-10-CM | POA: Diagnosis not present

## 2022-01-20 DIAGNOSIS — Z87891 Personal history of nicotine dependence: Secondary | ICD-10-CM | POA: Insufficient documentation

## 2022-01-20 DIAGNOSIS — J449 Chronic obstructive pulmonary disease, unspecified: Secondary | ICD-10-CM | POA: Insufficient documentation

## 2022-01-20 DIAGNOSIS — C3491 Malignant neoplasm of unspecified part of right bronchus or lung: Secondary | ICD-10-CM | POA: Diagnosis present

## 2022-01-20 DIAGNOSIS — I2510 Atherosclerotic heart disease of native coronary artery without angina pectoris: Secondary | ICD-10-CM | POA: Insufficient documentation

## 2022-01-20 DIAGNOSIS — I252 Old myocardial infarction: Secondary | ICD-10-CM | POA: Insufficient documentation

## 2022-01-20 DIAGNOSIS — I10 Essential (primary) hypertension: Secondary | ICD-10-CM | POA: Insufficient documentation

## 2022-01-20 DIAGNOSIS — K219 Gastro-esophageal reflux disease without esophagitis: Secondary | ICD-10-CM | POA: Diagnosis not present

## 2022-01-20 HISTORY — PX: IR IMAGING GUIDED PORT INSERTION: IMG5740

## 2022-01-20 MED ORDER — FENTANYL CITRATE (PF) 100 MCG/2ML IJ SOLN
INTRAMUSCULAR | Status: AC | PRN
  Administered 2022-01-20 (×2): 25 ug via INTRAVENOUS

## 2022-01-20 MED ORDER — FENTANYL CITRATE (PF) 100 MCG/2ML IJ SOLN
INTRAMUSCULAR | Status: AC
  Filled 2022-01-20: qty 2

## 2022-01-20 MED ORDER — LIDOCAINE-EPINEPHRINE 1 %-1:100000 IJ SOLN
INTRAMUSCULAR | Status: AC
  Administered 2022-01-20: 10 mL via SUBCUTANEOUS
  Filled 2022-01-20: qty 1

## 2022-01-20 MED ORDER — SODIUM CHLORIDE 0.9 % IV SOLN: INTRAVENOUS | Status: DC

## 2022-01-20 MED ORDER — MIDAZOLAM HCL 2 MG/2ML IJ SOLN
INTRAMUSCULAR | Status: AC | PRN
  Administered 2022-01-20 (×2): .5 mg via INTRAVENOUS

## 2022-01-20 MED ORDER — HEPARIN SOD (PORK) LOCK FLUSH 100 UNIT/ML IV SOLN
INTRAVENOUS | Status: AC
  Administered 2022-01-20: 500 [IU] via INTRAVENOUS
  Filled 2022-01-20: qty 5

## 2022-01-20 MED ORDER — MIDAZOLAM HCL 2 MG/2ML IJ SOLN
INTRAMUSCULAR | Status: AC
  Filled 2022-01-20: qty 2

## 2022-01-20 NOTE — Consult Note (Signed)
Chief Complaint: Patient was seen in consultation today for port a cath placement  Referring Physician(s): Mohamed,Mohamed  Supervising Physician: Ruthann Cancer  Patient Status: [facility] - Out-pt  History of Present Illness: Courtney Zuniga is a 71 y.o. female smoker with history of arthritis, brain tumor, COPD, coronary artery disease, depression, GERD, hypertension, prior non-STEMI presents now with recurrent non-small cell lung cancer.  She initially presented with right middle lobe pulmonary nodule, mediastinal lymphadenopathy, small nodule left upper lobe suspicious for cancer ,initially diagnosed in 2017.  She is scheduled today for Port-A-Cath placement to assist with additional treatment.  Past Medical History:  Diagnosis Date   Anemia    as a young woman   Arthritis    osteoartritis   Asthma    Brain tumor (benign) ([place]) 2005 [facility]   Benign   Chronic headaches    Chronic hip pain    Chronic pain    COPD (chronic obstructive pulmonary disease) (HCC)    Coronary artery disease    Depression    Depression 05/15/2016   Encounter for antineoplastic chemotherapy 01/10/2016   GERD (gastroesophageal reflux disease)    Hypertension    Lung cancer ([place]) dx'd 01/2016   currently on chemo and radiation    NSTEMI (non-ST elevated myocardial infarction) ([place]) yrs ago   On home O2    qhs 2 liters at hs and prn   Pneumonia last time 2 yrs ago   Shortness of breath dyspnea    with activity    Past Surgical History:  Procedure Laterality Date   CHOLECYSTECTOMY     COLONOSCOPY  2015   Results requested from [facility]   COLONOSCOPY     ESOPHAGOGASTRODUODENOSCOPY N/A 08/14/2015   Procedure: ESOPHAGOGASTRODUODENOSCOPY (EGD);  Surgeon: Daneil Dolin, MD;  Location: [facility];  Service: Endoscopy;  Laterality: N/A;  215    ESOPHAGOGASTRODUODENOSCOPY (EGD) WITH PROPOFOL N/A 09/13/2015   Procedure: ESOPHAGOGASTRODUODENOSCOPY (EGD) WITH PROPOFOL;  Surgeon: Milus Banister, MD;  Location: [facility] ENDOSCOPY;  Service: Endoscopy;  Laterality: N/A;   EUS N/A 03/12/2017   Procedure: UPPER ENDOSCOPIC ULTRASOUND (EUS) RADIAL;  Surgeon: Milus Banister, MD;  Location: [facility] ENDOSCOPY;  Service: Endoscopy;  Laterality: N/A;   TUMOR REMOVAL  2005   Benign   UPPER ESOPHAGEAL ENDOSCOPIC ULTRASOUND (EUS)  09/13/2015   Procedure: UPPER ESOPHAGEAL ENDOSCOPIC ULTRASOUND (EUS);  Surgeon: Milus Banister, MD;  Location: [facility];  Service: Endoscopy;;   VIDEO BRONCHOSCOPY WITH ENDOBRONCHIAL NAVIGATION N/A 12/31/2015   Procedure: VIDEO BRONCHOSCOPY WITH ENDOBRONCHIAL NAVIGATION;  Surgeon: Melrose Nakayama, MD;  Location: [place];  Service: Thoracic;  Laterality: N/A;   VIDEO BRONCHOSCOPY WITH ENDOBRONCHIAL ULTRASOUND N/A 11/08/2015   Procedure: VIDEO BRONCHOSCOPY WITH ENDOBRONCHIAL ULTRASOUND;  Surgeon: Ivin Poot, MD;  Location: [facility] OR;  Service: Thoracic;  Laterality: N/A;    Allergies: Desyrel [trazodone] and No known allergies  Medications: Prior to Admission medications   Medication Sig Start Date End Date Taking? Authorizing Provider  acetaminophen (TYLENOL) 325 MG tablet Take 2 tablets (650 mg total) by mouth every 6 (six) hours as needed for mild pain, fever or headache. 05/08/18  Yes Emokpae, Courage, MD  albuterol (PROVENTIL HFA;VENTOLIN HFA) 108 (90 BASE) MCG/ACT inhaler Inhale 2 puffs into the lungs every 4 (four) hours as needed for shortness of breath.    Yes [provider]  atorvastatin (LIPITOR) 10 MG tablet Take 10 mg by mouth daily.   Yes [provider]  BREO ELLIPTA 100-25  MCG/ACT AEPB 1 puff daily. 12/13/21  Yes [provider]  Cholecalciferol (VITAMIN D3) 125 MCG (5000 UT) CAPS Take 1 capsule by mouth daily. 02/17/18  Yes [provider]  diclofenac sodium (VOLTAREN) 1 % GEL Apply topically 4 (four) times daily as needed (massge gel into affected area(s) as needed for pain).  08/11/17  Yes [provider]   DULoxetine (CYMBALTA) 60 MG capsule Cymbalta 60 mg capsule,delayed release  Take 1 capsule every day by oral route.  stop 30 mg capsules   Yes [provider]  ENSURE (ENSURE) Take 237 mLs by mouth 3 (three) times daily between meals.   Yes [provider]  HYDROcodone bit-homatropine (HYCODAN) 5-1.5 MG/5ML syrup Take 5 mLs by mouth every 6 (six) hours as needed for cough. 11/10/21  Yes Curt Bears, MD  Ipratropium-Albuterol (COMBIVENT RESPIMAT) 20-100 MCG/ACT AERS respimat INHALE 2 PUFFS BY MOUTH FOUR TIMES DAILY AS NEEDED FOR WHEEZING 01/18/21  Yes [provider]  oxyCODONE-acetaminophen (PERCOCET/ROXICET) 5-325 MG tablet Take 1 tablet by mouth every 8 (eight) hours as needed for moderate pain. 05/08/18  Yes Emokpae, Courage, MD  pantoprazole (PROTONIX) 20 MG tablet Take 20 mg by mouth daily. Not sure of dosage   Yes [provider]  predniSONE (DELTASONE) 20 MG tablet Take 1 tablet (20 mg total) by mouth daily with breakfast. 11/25/21  Yes Azucena Cecil, PA-C  TRELEGY ELLIPTA 100-62.5-25 MCG/ACT AEPB Inhale 1 puff into the lungs daily. 04/08/21  Yes [provider]  Vitamin D, Ergocalciferol, (DRISDOL) 1.25 MG (50000 UNIT) CAPS capsule Take 50,000 Units by mouth once a week. 02/18/21  Yes [provider]  cyclobenzaprine (FLEXERIL) 10 MG tablet Take 1 tablet (10 mg total) by mouth 2 (two) times daily. *MAy take one additional tablet as needed for muscle spasms Patient taking differently: Take 10 mg by mouth 2 (two) times daily. *May take one additional tablet as needed for muscle spasms 05/08/18   Roxan Hockey, MD  Dupilumab (DUPIXENT) 300 MG/2ML SOPN Inject 300 mg into the skin every 14 (fourteen) days. Twice a month 03/29/20   [provider]  ipratropium-albuterol (DUONEB) 0.5-2.5 (3) MG/3ML SOLN Take 3 mLs by nebulization 3 (three) times daily. 05/08/18   Roxan Hockey, MD  lidocaine-prilocaine (EMLA) cream Apply to  the Port-A-Cath site 30-60 minutes before treatment 01/01/22   Curt Bears, MD  metoprolol tartrate (LOPRESSOR) 25 MG tablet Take 1 tablet (25 mg total) by mouth 2 (two) times daily. 08/09/20 09/08/20  Deatra James, MD  prochlorperazine (COMPAZINE) 10 MG tablet Take 1 tablet (10 mg total) by mouth every 6 (six) hours as needed for nausea or vomiting. Patient not taking: Reported on 01/01/2022 08/02/21   Curt Bears, MD  topiramate (TOPAMAX) 25 MG tablet Take 1 tablet by mouth daily at bedtime X 1 week; then increase to 25mg  BID. Patient taking differently: Take 25 mg by mouth 2 (two) times daily. 06/18/18   Barton Dubois, MD     Family History  Problem Relation Age of Onset   Ovarian cancer Mother    Lung cancer Father    Lung cancer Brother    Lung cancer Brother    Prostate cancer Brother    Brain cancer Sister        Half sister    Social History   Socioeconomic History   Marital status: Single    Spouse name: Not on file   Number of children: Not on file   Years of education: 10th  grade   Highest education level: Not on file  Occupational History   Occupation: retired  Tobacco Use   Smoking status: Former    Packs/day: 1.00    Years: 38.00    Total pack years: 38.00    Types: Cigarettes    Quit date: 07/26/2018    Years since quitting: 3.4   Smokeless tobacco: Never   Tobacco comments:    Pt has asked doctor for med to help   Vaping Use   Vaping Use: Never used  Substance and Sexual Activity   Alcohol use: No    Alcohol/week: 0.0 standard drinks of alcohol   Drug use: No   Sexual activity: Not Currently  Other Topics Concern   Not on file  Social History Narrative   Not on file   Social Determinants of Health   Financial Resource Strain: Low Risk  (05/05/2018)   Overall Financial Resource Strain (CARDIA)    Difficulty of Paying Living Expenses: Not very hard  Food Insecurity: No Food Insecurity (05/05/2018)   Hunger Vital Sign    Worried About  Running Out of Food in the Last Year: Never true    Ran Out of Food in the Last Year: Never true  Transportation Needs: No Transportation Needs (05/05/2018)   PRAPARE - Hydrologist (Medical): No    Lack of Transportation (Non-Medical): No  Physical Activity: Insufficiently Active (05/05/2018)   Exercise Vital Sign    Days of Exercise per Week: 5 days    Minutes of Exercise per Session: 10 min  Stress: No Stress Concern Present (05/05/2018)   [place]    Feeling of Stress : Not at all  Social Connections: Somewhat Isolated (05/05/2018)   Social Connection and Isolation Panel [NHANES]    Frequency of Communication with Friends and Family: More than three times a week    Frequency of Social Gatherings with Friends and Family: More than three times a week    Attends Religious Services: More than 4 times per year    Active Member of [employer] or Organizations: No    Attends Archivist Meetings: Never    Marital Status: Never married      Review of Systems denies fever, headache, chest pain, abdominal/back pain, nausea, vomiting or bleeding.  She does have chronic dyspnea and occasional cough.  Vital Signs: BP 139/88   Pulse 98   Temp 97.7 F (36.5 C) (Oral)   Resp 16   Ht 5\' 7"  (1.702 m)   Wt 130 lb (59 kg)   SpO2 100%   BMI 20.36 kg/m      Physical Exam awake, alert.  Chest with distant breath sounds bilaterally.  Heart with slightly tachycardic rate, occasional pauses;  Abdomen soft, positive bowel sounds, nontender.  No lower extremity edema.  Imaging: No results found.  Labs:  CBC: Recent Labs    11/25/21 1551 12/04/21 0839 12/14/21 2148 01/01/22 0939  WBC 4.8 5.0 4.3 4.7  HGB 13.9 13.0 12.3 13.2  HCT 39.1 37.3 36.7 39.4  PLT 223 231 213 269    COAGS: No results for input(s): "INR", "APTT" in the last 8760 hours.  BMP: Recent Labs    11/25/21 1551  12/04/21 0839 12/14/21 2148 01/01/22 0939  NA 145 145 142 143  K 3.7 4.2 3.8 4.3  CL 108 105 104 104  CO2 28 36* 30 34*  GLUCOSE 79 74 87 85  BUN  12 16 22 11   CALCIUM 9.0 9.3 9.3 9.2  CREATININE 0.47 0.43* 0.57 0.57  GFRNONAA >60 >60 >60 >60    LIVER FUNCTION TESTS: Recent Labs    10/09/21 1032 11/06/21 0841 12/04/21 0839 01/01/22 0939  BILITOT 0.2* 0.3 0.2* 0.3  AST 21 21 19 21   ALT 8 9 10 8   ALKPHOS 259* 252* 254* 321*  PROT 6.9 7.0 6.4* 6.9  ALBUMIN 4.2 4.2 4.0 4.2    TUMOR MARKERS: No results for input(s): "AFPTM", "CEA", "CA199", "CHROMGRNA" in the last 8760 hours.  Assessment and Plan: 71 y.o. female smoker with history of arthritis, brain tumor, COPD, coronary artery disease, depression, GERD, hypertension, prior non-STEMI presents now with recurrent non-small cell lung cancer.  She initially presented with right middle lobe pulmonary nodule, mediastinal lymphadenopathy, small nodule left upper lobe suspicious for cancer ,initially diagnosed in 2017.  She is scheduled today for Port-A-Cath placement to assist with additional treatment.Risks and benefits of image guided port-a-catheter placement was discussed with the patient including, but not limited to bleeding, infection, pneumothorax, or fibrin sheath development and need for additional procedures.  All of the patient's questions were answered, patient is agreeable to proceed. Consent signed and in chart.    Thank you for this interesting consult.  I greatly enjoyed meeting Rhiley Haymond and look forward to participating in their care.  A copy of this report was sent to the requesting provider on this date.  Electronically Signed: D. Rowe Robert, PA-C 01/20/2022, 11:07 AM   I spent a total of 20 Minutes    in face to face in clinical consultation, greater than 50% of which was counseling/coordinating care for port a cath placement

## 2022-01-20 NOTE — Procedures (Signed)
Interventional Radiology Procedure Note ° °Procedure: Single Lumen Power Port Placement   ° °Access:  Right internal jugular vein ° °Findings: Catheter tip positioned at cavoatrial junction. Port is ready for immediate use.  ° °Complications: None ° °EBL: < 10 mL ° °Recommendations:  °- Ok to shower in 24 hours °- Do not submerge for 7 days °- Routine line care  ° ° °Lyden Redner, MD ° ° ° °

## 2022-01-27 ENCOUNTER — Telehealth: Payer: Self-pay | Admitting: Internal Medicine

## 2022-01-27 ENCOUNTER — Other Ambulatory Visit: Payer: Self-pay

## 2022-01-27 NOTE — Telephone Encounter (Signed)
Called patient per October, November and December appointments. Patient is notified.

## 2022-01-27 NOTE — Progress Notes (Unsigned)
[facility] OFFICE PROGRESS NOTE  [name], [place] [address]  DIAGNOSIS:  Recurrent non-small cell lung cancer initially diagnosed as stage IIIA (T1b, N2, M0) non-small cell lung cancer presented with right middle lobe pulmonary nodule, mediastinal lymphadenopathy and highly suspicious for small nodule in the left upper lobe that could change her stage to stage IV that could present another synchronous primary lesion in the left upper lobe. This was diagnosed in September 2017.  PRIOR THERAPY: 1) Concurrent chemoradiation with weekly carboplatin for AUC of 2 and paclitaxel 45 MG/M2 status post 6 cycles last dose was given 02/25/2016 with partial response. 2) Consolidation chemotherapy with carboplatin for AUC of 5 and paclitaxel 175 MG/M2 every 3 weeks with Neulasta support. First dose 04/22/2016. Status post 3 cycles. 3) Second line treatment with immunotherapy with Nivolumab 480 mg IV every 4 weeks status post 32 cycles.  Discontinued secondary to intolerance and frequent hospitalization with pneumonia and pneumonitis.  She had a break of treatment between November 24, 2019 until June 28, 2020.  CURRENT THERAPY: Resuming her treatment with immunotherapy with nivolumab 480 mg IV every 4 weeks.  Cycle #33 started on June 28, 2020.  Status post 52 cycles.  INTERVAL HISTORY: Zalika Samarin 71 y.o. female returns to the clinic for a follow up visit. Overall, the patient reports that she tolerates treatment well without any side effects. Her only ongoing concern is related to decreased appetite. She would be interested in an appetite stimulant. She tries to drink water. She drinks ensures. She reports getting full easily. Additionally, her weight is stable. She is scheduled to see a member of the nutritionist team at her next appointment. The patient does get baseline headache due to having a history of a brain tumor in 2005.  She generally will take BC powder.  Denies unusual headaches today. Denies any fever, chills, night sweats, or weight loss. Denies any chest pain or hemoptysis.  She reports similar dyspnea and cough at her baseline.  Denies any nausea, vomiting, diarrhea, or constipation. Denies any rashes or skin changes. The patient recently had a restaging CT scan performed. The patient mentions she has had a stable knot on her left forearm. No pain, erythema, or swelling. No recent IV sites in this area. It has been there for a few months and not enlarging.The patient is here today for evaluation and repeat blood work before starting cycle #53     MEDICAL HISTORY: Past Medical History:  Diagnosis Date   Anemia    as a young woman   Arthritis    osteoartritis   Asthma    Brain tumor (benign) ([facility]) 2005 [facility]   Benign   Chronic headaches    Chronic hip pain    Chronic pain    COPD (chronic obstructive pulmonary disease) ([facility])    Coronary artery disease    Depression    Depression 05/15/2016   Encounter for antineoplastic chemotherapy 01/10/2016   GERD (gastroesophageal reflux disease)    Hypertension    Lung cancer ([facility]) dx'd 01/2016   currently on chemo and radiation    NSTEMI (non-ST elevated myocardial infarction) ([facility]) yrs ago   On home O2    qhs 2 liters at hs and prn   Pneumonia last time 2 yrs ago   Shortness of breath dyspnea    with activity    ALLERGIES:  is allergic to desyrel [trazodone] and no known allergies.  MEDICATIONS:  Current Outpatient Medications  Medication Sig Dispense Refill   acetaminophen (TYLENOL) 325 MG tablet Take 2 tablets (650 mg total) by mouth every 6 (six) hours as needed for mild pain, fever or headache. 12 tablet 2   albuterol (PROVENTIL HFA;VENTOLIN HFA) 108 (90 BASE) MCG/ACT inhaler Inhale 2 puffs into the lungs every 4 (four) hours as needed for shortness of breath.      atorvastatin (LIPITOR) 10 MG tablet Take 10 mg by mouth daily.     BREO ELLIPTA 100-25 MCG/ACT AEPB 1 puff daily.      Cholecalciferol (VITAMIN D3) 125 MCG (5000 UT) CAPS Take 1 capsule by mouth daily.  0   cyclobenzaprine (FLEXERIL) 10 MG tablet Take 1 tablet (10 mg total) by mouth 2 (two) times daily. *MAy take one additional tablet as needed for muscle spasms (Patient taking differently: Take 10 mg by mouth 2 (two) times daily. *May take one additional tablet as needed for muscle spasms) 60 tablet 2   diclofenac sodium (VOLTAREN) 1 % GEL Apply topically 4 (four) times daily as needed (massge gel into affected area(s) as needed for pain).   1   DULoxetine (CYMBALTA) 60 MG capsule Cymbalta 60 mg capsule,delayed release  Take 1 capsule every day by oral route.  stop 30 mg capsules     Dupilumab (DUPIXENT) 300 MG/2ML SOPN Inject 300 mg into the skin every 14 (fourteen) days. Twice a month     ENSURE (ENSURE) Take 237 mLs by mouth 3 (three) times daily between meals.     HYDROcodone bit-homatropine (HYCODAN) 5-1.5 MG/5ML syrup Take 5 mLs by mouth every 6 (six) hours as needed for cough. 120 mL 0   Ipratropium-Albuterol (COMBIVENT RESPIMAT) 20-100 MCG/ACT AERS respimat INHALE 2 PUFFS BY MOUTH FOUR TIMES DAILY AS NEEDED FOR WHEEZING     ipratropium-albuterol (DUONEB) 0.5-2.5 (3) MG/3ML SOLN Take 3 mLs by nebulization 3 (three) times daily. 360 mL 2   lidocaine-prilocaine (EMLA) cream Apply to the Port-A-Cath site 30-60 minutes before treatment 30 g 0   megestrol (MEGACE ES) 625 MG/5ML suspension Take 5 mLs (625 mg total) by mouth daily. 150 mL 0   oxyCODONE-acetaminophen (PERCOCET/ROXICET) 5-325 MG tablet Take 1 tablet by mouth every 8 (eight) hours as needed for moderate pain. 12 tablet 0   pantoprazole (PROTONIX) 20 MG tablet Take 20 mg by mouth daily. Not sure of dosage     predniSONE (DELTASONE) 20 MG tablet Take 1 tablet (20 mg total) by mouth daily with breakfast. 2 tablet 0   prochlorperazine (COMPAZINE) 10 MG tablet Take 1 tablet (10 mg total) by mouth every 6 (six) hours as needed for nausea or vomiting. 30  tablet 0   topiramate (TOPAMAX) 25 MG tablet Take 1 tablet by mouth daily at bedtime X 1 week; then increase to 25mg  BID. (Patient taking differently: Take 25 mg by mouth 2 (two) times daily.) 45 tablet 2   TRELEGY ELLIPTA 100-62.5-25 MCG/ACT AEPB Inhale 1 puff into the lungs daily.     Vitamin D, Ergocalciferol, (DRISDOL) 1.25 MG (50000 UNIT) CAPS capsule Take 50,000 Units by mouth once a week.     metoprolol tartrate (LOPRESSOR) 25 MG tablet Take 1 tablet (25 mg total) by mouth 2 (two) times daily. 60 tablet 0   No current facility-administered medications for this visit.    SURGICAL HISTORY:  Past Surgical History:  Procedure Laterality Date   CHOLECYSTECTOMY     COLONOSCOPY  2015   Results requested from [facility]  ESOPHAGOGASTRODUODENOSCOPY N/A 08/14/2015   Procedure: ESOPHAGOGASTRODUODENOSCOPY (EGD);  Surgeon: Daneil Dolin, MD;  Location: [facility];  Service: Endoscopy;  Laterality: N/A;  215    ESOPHAGOGASTRODUODENOSCOPY (EGD) WITH PROPOFOL N/A 09/13/2015   Procedure: ESOPHAGOGASTRODUODENOSCOPY (EGD) WITH PROPOFOL;  Surgeon: Milus Banister, MD;  Location: [facility];  Service: Endoscopy;  Laterality: N/A;   EUS N/A 03/12/2017   Procedure: UPPER ENDOSCOPIC ULTRASOUND (EUS) RADIAL;  Surgeon: Milus Banister, MD;  Location: [facility];  Service: Endoscopy;  Laterality: N/A;   IR IMAGING GUIDED PORT INSERTION  01/20/2022   TUMOR REMOVAL  2005   Benign   UPPER ESOPHAGEAL ENDOSCOPIC ULTRASOUND (EUS)  09/13/2015   Procedure: UPPER ESOPHAGEAL ENDOSCOPIC ULTRASOUND (EUS);  Surgeon: Milus Banister, MD;  Location: [facility];  Service: Endoscopy;;   VIDEO BRONCHOSCOPY WITH ENDOBRONCHIAL NAVIGATION N/A 12/31/2015   Procedure: VIDEO BRONCHOSCOPY WITH ENDOBRONCHIAL NAVIGATION;  Surgeon: Melrose Nakayama, MD;  Location: [facility];  Service: Thoracic;  Laterality: N/A;   VIDEO BRONCHOSCOPY WITH ENDOBRONCHIAL ULTRASOUND N/A 11/08/2015   Procedure: VIDEO  BRONCHOSCOPY WITH ENDOBRONCHIAL ULTRASOUND;  Surgeon: Ivin Poot, MD;  Location: [facility];  Service: Thoracic;  Laterality: N/A;    REVIEW OF SYSTEMS:   Constitutional: Positive for baseline fatigue.  Positive for diminished appetite. Negative for chills,  fever and unexpected weight change.  HENT:   Negative for mouth sores, nosebleeds, sore throat and trouble swallowing.   Eyes: Negative for eye problems and icterus.  Respiratory: Positive for baseline dyspnea.  Negative for hemoptysis, shortness of breath and wheezing.   Cardiovascular: Negative for chest pain and leg swelling.  Gastrointestinal: Negative for abdominal pain, constipation, diarrhea, nausea and vomiting.  Genitourinary: Negative for bladder incontinence, difficulty urinating, dysuria, frequency and hematuria.   Musculoskeletal: Negative for back pain, gait problem, neck pain and neck stiffness.  Skin: Negative for itching and rash.  Neurological:  Negative for dizziness, headache (none at this time), extremity weakness, gait problem, light-headedness and seizures.  Hematological: Negative for adenopathy. Does not bruise/bleed easily.  Psychiatric/Behavioral: Negative for confusion, depression and sleep disturbance. The patient is not nervous/anxious.     PHYSICAL EXAMINATION:  Blood pressure 113/79, pulse (!) 103, temperature 99.1 F (37.3 C), temperature source Tympanic, resp. rate 20, height 5\' 7"  (1.702 m), weight 125 lb 3.2 oz (56.8 kg), SpO2 98 %.  ECOG PERFORMANCE STATUS: 1-2  Physical Exam  Constitutional: Oriented to person, place, and time and w thin appearing female and in no distress.  HENT:  Head: Normocephalic and atraumatic.  Mouth/Throat: Oropharynx is clear and moist. No oropharyngeal exudate.  Eyes: Conjunctivae are normal. Right eye exhibits no discharge. Left eye exhibits no discharge. No scleral icterus.  Neck: Normal range of motion. Neck supple.  Cardiovascular: Normal rate, regular rhythm,  normal heart sounds and intact distal pulses.   Pulmonary/Chest: Effort normal.  Quiet breath sounds in all lung fields.  No respiratory distress. No wheezes. No rales.  Abdominal: Soft. Bowel sounds are normal. Exhibits no distension and no mass. There is no tenderness.  Musculoskeletal: Normal range of motion. Exhibits no edema.  Lymphadenopathy:    No cervical adenopathy.  Neurological: Left facial twitching and weakness (baseline from brain tumor in 2005-stable/chronic). Alert and oriented to person, place, and time.  Exhibits muscle wasting.  The patient was examined in the wheelchair.  Skin: Skin is warm and dry. No rash noted. Not diaphoretic. No erythema. No pallor.  Psychiatric: Mood, memory and judgment normal.  Vitals reviewed.  LABORATORY DATA: Lab Results  Component Value Date   WBC 4.6 01/29/2022   HGB 13.0 01/29/2022   HCT 38.0 01/29/2022   MCV 84.8 01/29/2022   PLT 213 01/29/2022      Chemistry      Component Value Date/Time   NA 144 01/29/2022 1054   NA 144 04/09/2017 0948   K 4.6 01/29/2022 1054   K 4.3 04/09/2017 0948   CL 105 01/29/2022 1054   CO2 33 (H) 01/29/2022 1054   CO2 25 04/09/2017 0948   BUN 16 01/29/2022 1054   BUN 16.8 04/09/2017 0948   CREATININE 0.56 01/29/2022 1054   CREATININE 0.8 04/09/2017 0948      Component Value Date/Time   CALCIUM 9.5 01/29/2022 1054   CALCIUM 9.1 04/09/2017 0948   ALKPHOS 292 (H) 01/29/2022 1054   ALKPHOS 216 (H) 04/09/2017 0948   AST 22 01/29/2022 1054   AST 12 04/09/2017 0948   ALT 11 01/29/2022 1054   ALT 13 04/09/2017 0948   BILITOT 0.2 (L) 01/29/2022 1054   BILITOT 0.22 04/09/2017 0948       RADIOGRAPHIC STUDIES:  CT Chest W Contrast  Result Date: 01/29/2022 CLINICAL DATA:  71 year old female with history of non-small cell lung cancer. Staging examination. * Tracking Code: BO * EXAM: CT CHEST WITH CONTRAST TECHNIQUE: Multidetector CT imaging of the chest was performed during intravenous contrast  administration. RADIATION DOSE REDUCTION: This exam was performed according to the departmental dose-optimization program which includes automated exposure control, adjustment of the mA and/or kV according to patient size and/or use of iterative reconstruction technique. CONTRAST:  138mL OMNIPAQUE IOHEXOL 300 MG/ML  SOLN COMPARISON:  Chest CT 10/07/2021. FINDINGS: Cardiovascular: Heart size is normal. There is no significant pericardial fluid, thickening or pericardial calcification. There is aortic atherosclerosis, as well as atherosclerosis of the great vessels of the mediastinum and the coronary arteries, including calcified atherosclerotic plaque in the left main, left anterior descending, left circumflex and right coronary arteries. Thickening and calcification of the aortic valve. Right internal jugular single-lumen Port-A-Cath with tip terminating at the superior cavoatrial junction. Mediastinum/Nodes: No pathologically enlarged mediastinal or hilar lymph nodes. Esophagus is unremarkable in appearance. No axillary lymphadenopathy. 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 Additionally, mass-like architectural distortion and chronic volume loss is noted in the perihilar aspect of the right lung with complete chronic atelectasis of the right middle lobe, similar to prior examinations, most compatible with areas of chronic postradiation mass-like fibrosis. Retained secretions are noted in the bronchus intermedius. No new suspicious appearing pulmonary nodules or masses are noted. No acute consolidative airspace disease. No pleural effusions. Diffuse bronchial wall thickening with severe centrilobular and paraseptal emphysema. Upper Abdomen: Aortic atherosclerosis. Musculoskeletal: Two well-defined  densely sclerotic lesions in the lateral aspect of the right seventh rib, similar to prior studies, likely bone islands. There are no other aggressive appearing lytic or blastic lesions noted in the visualized portions of the skeleton. IMPRESSION: 1. Stable examination demonstrating areas of postradiation fibrosis in the perihilar aspect of the right lung and in the right upper lobe. No findings to suggest recurrent or metastatic disease in the thorax on today's examination. 2. Diffuse bronchial wall thickening with severe centrilobular and paraseptal emphysema; imaging findings suggestive of underlying COPD. 3. Aortic atherosclerosis, in addition to left main  and three-vessel coronary artery disease. Assessment for potential risk factor modification, dietary therapy or pharmacologic therapy may be warranted, if clinically indicated. 4. There are calcifications of the aortic valve. Echocardiographic correlation for evaluation of potential valvular dysfunction may be warranted if clinically indicated. Aortic Atherosclerosis (ICD10-I70.0) and Emphysema (ICD10-J43.9). Electronically Signed   By: Vinnie Langton M.D.   On: 01/29/2022 07:49   IR IMAGING GUIDED PORT INSERTION  Result Date: 01/20/2022 INDICATION: 71 year old female with advanced stage lung cancer requiring central venous access for chemotherapy administration. EXAM: IMPLANTED PORT A CATH PLACEMENT WITH ULTRASOUND AND FLUOROSCOPIC GUIDANCE COMPARISON:  None Available. MEDICATIONS: None. ANESTHESIA/SEDATION: Moderate (conscious) sedation was employed during this procedure. A total of Versed 1 mg and Fentanyl 50 mcg was administered intravenously. Moderate Sedation Time: 17 minutes. The patient's level of consciousness and vital signs were monitored continuously by radiology nursing throughout the procedure under my direct supervision. CONTRAST:  None FLUOROSCOPY TIME:  0.2 minutes, 0 mGy COMPLICATIONS: None immediate. PROCEDURE: The procedure, risks,  benefits, and alternatives were explained to the patient. Questions regarding the procedure were encouraged and answered. The patient understands and consents to the procedure. The right neck and chest were prepped with chlorhexidine in a sterile fashion, and a sterile drape was applied covering the operative field. Maximum barrier sterile technique with sterile gowns and gloves were used for the procedure. A timeout was performed prior to the initiation of the procedure. Ultrasound was used to examine the jugular vein which was compressible and free of internal echoes. A skin marker was used to demarcate the planned venotomy and port pocket incision sites. Local anesthesia was provided to these sites and the subcutaneous tunnel track with 1% lidocaine with 1:100,000 epinephrine. A small incision was created at the jugular access site and blunt dissection was performed of the subcutaneous tissues. Under ultrasound guidance, the jugular vein was accessed with a 21 ga micropuncture needle and an 0.018" wire was inserted to the superior vena cava. Real-time ultrasound guidance was utilized for vascular access including the acquisition of a permanent ultrasound image documenting patency of the accessed vessel. A 5 Fr micopuncture set was then used, through which a 0.035" Rosen wire was passed under fluoroscopic guidance into the inferior vena cava. An 8 Fr dilator was then placed over the wire. A subcutaneous port pocket was then created along the upper chest wall utilizing a combination of sharp and blunt dissection. The pocket was irrigated with sterile saline, packed with gauze, and observed for hemorrhage. A single lumen slim sized power injectable port was chosen for placement. The 8 Fr catheter was tunneled from the port pocket site to the venotomy incision. The port was placed in the pocket. The external catheter was trimmed to appropriate length. The dilator was exchanged for an 8 Fr peel-away sheath under  fluoroscopic guidance. The catheter was then placed through the sheath and the sheath was removed. Final catheter positioning was confirmed and documented with a fluoroscopic spot radiograph. The port was accessed with a Huber needle, aspirated, and flushed with heparinized saline. The deep dermal layer of the port pocket incision was closed with interrupted 3-0 Vicryl suture. The skin was opposed with a running subcuticular 4-0 Monocryl suture. Dermabond was then placed over the port pocket and neck incisions. The patient tolerated the procedure well without immediate post procedural complication. FINDINGS: After catheter placement, the tip lies within the superior cavoatrial junction. The catheter aspirates and flushes normally and is ready for immediate use. IMPRESSION: Successful placement of  a power injectable Port-A-Cath via the right internal jugular vein. The catheter is ready for immediate use. Ruthann Cancer, MD [facility] Electronically Signed   By: Ruthann Cancer M.D.   On: 01/20/2022 12:20     ASSESSMENT/PLAN:  This is a very pleasant 71 year old African-American female with recurrent metastatic non-small cell lung cancer, adenocarcinoma of the right middle lobe.  She was initially diagnosed as stage IIIa in September 2017.     She completed concurrent chemoradiation followed by consolidation with carboplatin and paclitaxel. She is status post 3 cycles. The patient was on observation but a restaging CT scan showed evidence of disease progression.   She is currently undergoing treatment with second line with immunotherapy with nivolumab 480 mg IV every 4 weeks.  She was status post 31 cycles. Her treatment had been on hold since August 2021 until her COPD can get under control. She showed evidence of some recurrent disease in March 2022 and she resumed her treatment with cycle number cycle #33 of single agent nivolumab 480 mg IV every 4  weeks in March 2022 status post 52 cycles.     Labs were reviewed.  Recommend that she proceed with cycle #53 today as scheduled.    We will see her back for a follow up visit in 4 weeks before starting cycle #54.   The patient recently had a restaging CT scan performed. The patient was seen with Dr. Julien Nordmann. Dr. Julien Nordmann personally and independently reviewed the scan and discussed the results with the patient. The scan showed no evidence of disease progression. She will continue on the same treatment at the same dose.    Regarding her decreased appetite, we will prescribe Megace.  Discussed with the patient there is a small risk of thrombosis.  Advised to call if she develops any signs and symptoms of calf pain, erythema, or swelling.  Patient is scheduled to meet with the member the nutritionist team at her next appointment.  The patient recently had a Port-A-Cath placed.  I will ensure that her future lab appointments are drawn off her Port-A-Cath.  The patient mentions that the IV contrast for her CT scan burned/hurt.  Discussed that for her next restaging CT scan that we can make her an appointment here at [facility] to access her port to use for the IV contrast in the future.  The patient was advised to call immediately if she has any concerning symptoms in the interval. The patient voices understanding of current disease status and treatment options and is in agreement with the current care plan. All questions were answered. The patient knows to call the clinic with any problems, questions or concerns. We can certainly see the patient much sooner if necessary  No orders of the defined types were placed in this encounter.    Donette Mainwaring L Eh Sesay, PA-C 01/29/22  ADDENDUM: Hematology/Oncology Attending: I had a face-to-face encounter with the patient today.  I reviewed her records, lab, scan and recommended her care plan.  This is a very pleasant 71 years old African-American female  with recurrent non-small cell lung cancer, adenocarcinoma diagnosed in September 2017 as a stage IIIa non-small cell lung cancer status post concurrent chemoradiation. The patient had evidence for disease recurrence and she started treatment with second line immunotherapy initially with nivolumab 480 Mg IV every 4 weeks status post 52 cycles.  She has been tolerating this treatment well with no concerning adverse effects. She has baseline COPD and she  is currently on home oxygen. She had repeat CT scan of the chest, abdomen and pelvis performed recently.  I personally and independently reviewed the scan and discussed the result with the patient today. Her scan showed no concerning findings for disease progression. I recommended for her to continue her current treatment with nivolumab with the same dose and she will proceed with cycle #53 today. For the lack of appetite and weight loss, will start the patient on Megace ES.  She understands that there is a risk for deep venous thrombosis with this treatment but she is very active and moves frequently. The patient will come back for follow-up visit in 4 weeks for evaluation before the next cycle of her treatment. The patient was advised to call immediately if she has any other concerning symptoms in the interval.  The total time spent in the appointment was 30 minutes. Disclaimer: This note was dictated with voice recognition software. Similar sounding words can inadvertently be transcribed and may be missed upon review. Eilleen Kempf, MD

## 2022-01-28 ENCOUNTER — Ambulatory Visit (HOSPITAL_COMMUNITY)
Admission: RE | Admit: 2022-01-28 | Discharge: 2022-01-28 | Disposition: A | Discharge status: Home / Self Care | Payer: Medicare Other | Source: Ambulatory Visit | Attending: Internal Medicine | Admitting: Internal Medicine

## 2022-01-28 DIAGNOSIS — C3490 Malignant neoplasm of unspecified part of unspecified bronchus or lung: Secondary | ICD-10-CM | POA: Insufficient documentation

## 2022-01-28 MED ORDER — SODIUM CHLORIDE (PF) 0.9 % IJ SOLN
INTRAMUSCULAR | Status: AC
  Filled 2022-01-28: qty 50

## 2022-01-28 MED ORDER — HEPARIN SOD (PORK) LOCK FLUSH 100 UNIT/ML IV SOLN
INTRAVENOUS | Status: AC
  Administered 2022-01-28: 500 [IU]
  Filled 2022-01-28: qty 5

## 2022-01-28 MED ORDER — IOHEXOL 300 MG/ML  SOLN
100.0000 mL | Freq: Once | INTRAMUSCULAR | Status: AC | PRN
  Administered 2022-01-28: 100 mL via INTRAVENOUS

## 2022-01-29 ENCOUNTER — Inpatient Hospital Stay: Payer: Medicare Other | Attending: Physician Assistant

## 2022-01-29 ENCOUNTER — Inpatient Hospital Stay (HOSPITAL_BASED_OUTPATIENT_CLINIC_OR_DEPARTMENT_OTHER): Payer: Medicare Other | Admitting: Physician Assistant

## 2022-01-29 ENCOUNTER — Other Ambulatory Visit: Payer: Self-pay

## 2022-01-29 ENCOUNTER — Inpatient Hospital Stay: Payer: Medicare Other

## 2022-01-29 VITALS — HR 99

## 2022-01-29 VITALS — BP 113/79 | HR 103 | Temp 99.1°F | Resp 20 | Ht 67.0 in | Wt 125.2 lb

## 2022-01-29 DIAGNOSIS — R630 Anorexia: Secondary | ICD-10-CM

## 2022-01-29 DIAGNOSIS — C3491 Malignant neoplasm of unspecified part of right bronchus or lung: Secondary | ICD-10-CM | POA: Diagnosis not present

## 2022-01-29 DIAGNOSIS — Z5112 Encounter for antineoplastic immunotherapy: Secondary | ICD-10-CM | POA: Diagnosis not present

## 2022-01-29 DIAGNOSIS — Z79899 Other long term (current) drug therapy: Secondary | ICD-10-CM | POA: Insufficient documentation

## 2022-01-29 DIAGNOSIS — C342 Malignant neoplasm of middle lobe, bronchus or lung: Secondary | ICD-10-CM | POA: Diagnosis present

## 2022-01-29 LAB — CMP (CANCER CENTER ONLY)
ALT: 11 U/L (ref 0–44)
AST: 22 U/L (ref 15–41)
Albumin: 4.2 g/dL (ref 3.5–5.0)
Alkaline Phosphatase: 292 U/L — ABNORMAL HIGH (ref 38–126)
Anion gap: 6 (ref 5–15)
BUN: 16 mg/dL (ref 8–23)
CO2: 33 mmol/L — ABNORMAL HIGH (ref 22–32)
Calcium: 9.5 mg/dL (ref 8.9–10.3)
Chloride: 105 mmol/L (ref 98–111)
Creatinine: 0.56 mg/dL (ref 0.44–1.00)
GFR, Estimated: >60 mL/min (ref >60)
Glucose, Bld: 78 mg/dL (ref 70–99)
Potassium: 4.6 mmol/L (ref 3.5–5.1)
Sodium: 144 mmol/L (ref 135–145)
Total Bilirubin: 0.2 mg/dL — ABNORMAL LOW (ref 0.3–1.2)
Total Protein: 7.2 g/dL (ref 6.5–8.1)

## 2022-01-29 LAB — CBC WITH DIFFERENTIAL (CANCER CENTER ONLY)
Abs Immature Granulocytes: 0.02 10*3/uL (ref 0.00–0.07)
Basophils Absolute: 0 10*3/uL (ref 0.0–0.1)
Basophils Relative: 1 %
Eosinophils Absolute: 0.1 10*3/uL (ref 0.0–0.5)
Eosinophils Relative: 2 %
HCT: 38 % (ref 36.0–46.0)
Hemoglobin: 13 g/dL (ref 12.0–15.0)
Immature Granulocytes: 0 %
Lymphocytes Relative: 20 %
Lymphs Abs: 0.9 10*3/uL (ref 0.7–4.0)
MCH: 29 pg (ref 26.0–34.0)
MCHC: 34.2 g/dL (ref 30.0–36.0)
MCV: 84.8 fL (ref 80.0–100.0)
Monocytes Absolute: 0.3 10*3/uL (ref 0.1–1.0)
Monocytes Relative: 7 %
Neutro Abs: 3.2 10*3/uL (ref 1.7–7.7)
Neutrophils Relative %: 70 %
Platelet Count: 213 10*3/uL (ref 150–400)
RBC: 4.48 MIL/uL (ref 3.87–5.11)
RDW: 15.8 % — ABNORMAL HIGH (ref 11.5–15.5)
WBC Count: 4.6 10*3/uL (ref 4.0–10.5)
nRBC: 0 % (ref 0.0–0.2)

## 2022-01-29 LAB — TSH: TSH: 2.449 u[IU]/mL (ref 0.350–4.500)

## 2022-01-29 MED ORDER — SODIUM CHLORIDE 0.9 % IV SOLN
480.0000 mg | Freq: Once | INTRAVENOUS | Status: AC
  Administered 2022-01-29: 480 mg via INTRAVENOUS
  Filled 2022-01-29: qty 48

## 2022-01-29 MED ORDER — MEGESTROL ACETATE 625 MG/5ML PO SUSP: 625.0000 mg | Freq: Every day | ORAL | 0 refills | Status: DC

## 2022-01-29 MED ORDER — SODIUM CHLORIDE 0.9 % IV SOLN: Freq: Once | INTRAVENOUS | Status: AC

## 2022-01-29 MED ORDER — SODIUM CHLORIDE 0.9% FLUSH
10.0000 mL | INTRAVENOUS | Status: DC | PRN
  Administered 2022-01-29: 10 mL

## 2022-01-29 MED ORDER — HEPARIN SOD (PORK) LOCK FLUSH 100 UNIT/ML IV SOLN
500.0000 [IU] | Freq: Once | INTRAVENOUS | Status: AC | PRN
  Administered 2022-01-29: 500 [IU]

## 2022-01-29 NOTE — Patient Instructions (Signed)
[facility]   Discharge Instructions: Thank you for choosing [facility] to provide your oncology and hematology care.   If you have a lab appointment with the Cancer Center, please go directly to the Cancer Center and check in at the registration area.   Wear comfortable clothing and clothing appropriate for easy access to any Portacath or PICC line.   We strive to give you quality time with your provider. You may need to reschedule your appointment if you arrive late (15 or more minutes).  Arriving late affects you and other patients whose appointments are after yours.  Also, if you miss three or more appointments without notifying the office, you may be dismissed from the clinic at the provider's discretion.      For prescription refill requests, have your pharmacy contact our office and allow 72 hours for refills to be completed.    Today you received the following chemotherapy and/or immunotherapy agents: nivolumab      To help prevent nausea and vomiting after your treatment, we encourage you to take your nausea medication as directed.  BELOW ARE SYMPTOMS THAT SHOULD BE REPORTED IMMEDIATELY: *FEVER GREATER THAN 100.4 F (38 C) OR HIGHER *CHILLS OR SWEATING *NAUSEA AND VOMITING THAT IS NOT CONTROLLED WITH YOUR NAUSEA MEDICATION *UNUSUAL SHORTNESS OF BREATH *UNUSUAL BRUISING OR BLEEDING *URINARY PROBLEMS (pain or burning when urinating, or frequent urination) *BOWEL PROBLEMS (unusual diarrhea, constipation, pain near the anus) TENDERNESS IN MOUTH AND THROAT WITH OR WITHOUT PRESENCE OF ULCERS (sore throat, sores in mouth, or a toothache) UNUSUAL RASH, SWELLING OR PAIN  UNUSUAL VAGINAL DISCHARGE OR ITCHING   Items with * indicate a potential emergency and should be followed up as soon as possible or go to the Emergency Department if any problems should occur.  Please show the CHEMOTHERAPY ALERT CARD or IMMUNOTHERAPY ALERT CARD at check-in  to the Emergency Department and triage nurse.  Should you have questions after your visit or need to cancel or reschedule your appointment, please contact [facility]  Dept: [phone]  and follow the prompts.  Office hours are 8:00 a.m. to 4:30 p.m. Monday - Friday. Please note that voicemails left after 4:00 p.m. may not be returned until the following business day.  We are closed weekends and major holidays. You have access to a nurse at all times for urgent questions. Please call the main number to the clinic Dept: [phone] and follow the prompts.   For any non-urgent questions, you may also contact your provider using MyChart. We now offer e-Visits for anyone 18 and older to request care online for non-urgent symptoms. For details visit [email or web address].   Also download the MyChart app! Go to the app store, search "MyChart", open the app, select [place], and log in with your MyChart username and password.  Masks are optional in the cancer centers. If you would like for your care team to wear a mask while they are taking care of you, please let them know. For doctor visits, patients may have with them one support person who is at least 72 years old. At this time, visitors are not allowed in the infusion area. 

## 2022-01-30 ENCOUNTER — Other Ambulatory Visit: Payer: Self-pay

## 2022-01-30 LAB — T4: T4, Total: 7.1 ug/dL (ref 4.5–12.0)

## 2022-01-31 ENCOUNTER — Other Ambulatory Visit: Payer: Self-pay

## 2022-02-18 ENCOUNTER — Other Ambulatory Visit: Payer: Self-pay

## 2022-02-26 ENCOUNTER — Inpatient Hospital Stay: Payer: Medicare Other

## 2022-02-26 ENCOUNTER — Ambulatory Visit: Payer: Medicare Other

## 2022-02-26 ENCOUNTER — Inpatient Hospital Stay (HOSPITAL_BASED_OUTPATIENT_CLINIC_OR_DEPARTMENT_OTHER): Payer: Medicare Other | Admitting: Internal Medicine

## 2022-02-26 ENCOUNTER — Ambulatory Visit: Payer: Medicare Other | Admitting: Internal Medicine

## 2022-02-26 ENCOUNTER — Other Ambulatory Visit: Payer: Medicare Other

## 2022-02-26 ENCOUNTER — Other Ambulatory Visit: Payer: Self-pay

## 2022-02-26 ENCOUNTER — Encounter: Payer: Medicare Other | Admitting: Dietician

## 2022-02-26 ENCOUNTER — Inpatient Hospital Stay: Payer: Medicare Other | Attending: Physician Assistant

## 2022-02-26 ENCOUNTER — Encounter: Payer: Self-pay | Admitting: Internal Medicine

## 2022-02-26 DIAGNOSIS — Z79899 Other long term (current) drug therapy: Secondary | ICD-10-CM | POA: Diagnosis not present

## 2022-02-26 DIAGNOSIS — C342 Malignant neoplasm of middle lobe, bronchus or lung: Secondary | ICD-10-CM | POA: Insufficient documentation

## 2022-02-26 DIAGNOSIS — C3490 Malignant neoplasm of unspecified part of unspecified bronchus or lung: Secondary | ICD-10-CM

## 2022-02-26 DIAGNOSIS — C3491 Malignant neoplasm of unspecified part of right bronchus or lung: Secondary | ICD-10-CM

## 2022-02-26 DIAGNOSIS — Z5112 Encounter for antineoplastic immunotherapy: Secondary | ICD-10-CM | POA: Insufficient documentation

## 2022-02-26 DIAGNOSIS — Z95828 Presence of other vascular implants and grafts: Secondary | ICD-10-CM

## 2022-02-26 LAB — CBC WITH DIFFERENTIAL (CANCER CENTER ONLY)
Abs Immature Granulocytes: 0.01 10*3/uL (ref 0.00–0.07)
Basophils Absolute: 0 10*3/uL (ref 0.0–0.1)
Basophils Relative: 0 %
Eosinophils Absolute: 0.1 10*3/uL (ref 0.0–0.5)
Eosinophils Relative: 1 %
HCT: 35.4 % — ABNORMAL LOW (ref 36.0–46.0)
Hemoglobin: 12.4 g/dL (ref 12.0–15.0)
Immature Granulocytes: 0 %
Lymphocytes Relative: 15 %
Lymphs Abs: 0.7 10*3/uL (ref 0.7–4.0)
MCH: 29.3 pg (ref 26.0–34.0)
MCHC: 35 g/dL (ref 30.0–36.0)
MCV: 83.7 fL (ref 80.0–100.0)
Monocytes Absolute: 0.3 10*3/uL (ref 0.1–1.0)
Monocytes Relative: 7 %
Neutro Abs: 3.6 10*3/uL (ref 1.7–7.7)
Neutrophils Relative %: 77 %
Platelet Count: 267 10*3/uL (ref 150–400)
RBC: 4.23 MIL/uL (ref 3.87–5.11)
RDW: 15.9 % — ABNORMAL HIGH (ref 11.5–15.5)
WBC Count: 4.7 10*3/uL (ref 4.0–10.5)
nRBC: 0 % (ref 0.0–0.2)

## 2022-02-26 LAB — CMP (CANCER CENTER ONLY)
ALT: 144 U/L — ABNORMAL HIGH (ref 0–44)
AST: 98 U/L — ABNORMAL HIGH (ref 15–41)
Albumin: 4.2 g/dL (ref 3.5–5.0)
Alkaline Phosphatase: 889 U/L — ABNORMAL HIGH (ref 38–126)
Anion gap: 6 (ref 5–15)
BUN: 17 mg/dL (ref 8–23)
CO2: 32 mmol/L (ref 22–32)
Calcium: 9.3 mg/dL (ref 8.9–10.3)
Chloride: 104 mmol/L (ref 98–111)
Creatinine: 0.51 mg/dL (ref 0.44–1.00)
GFR, Estimated: >60 mL/min (ref >60)
Glucose, Bld: 89 mg/dL (ref 70–99)
Potassium: 3.9 mmol/L (ref 3.5–5.1)
Sodium: 142 mmol/L (ref 135–145)
Total Bilirubin: 0.4 mg/dL (ref 0.3–1.2)
Total Protein: 7.3 g/dL (ref 6.5–8.1)

## 2022-02-26 LAB — TSH: TSH: 2.394 u[IU]/mL (ref 0.350–4.500)

## 2022-02-26 MED ORDER — HYDROCODONE BIT-HOMATROP MBR 5-1.5 MG/5ML PO SOLN: 5.0000 mL | Freq: Four times a day (QID) | ORAL | 0 refills | Status: DC | PRN

## 2022-02-26 MED ORDER — SODIUM CHLORIDE 0.9 % IV SOLN
480.0000 mg | Freq: Once | INTRAVENOUS | Status: AC
  Administered 2022-02-26: 480 mg via INTRAVENOUS
  Filled 2022-02-26: qty 48

## 2022-02-26 MED ORDER — SODIUM CHLORIDE 0.9 % IV SOLN: Freq: Once | INTRAVENOUS | Status: AC

## 2022-02-26 MED ORDER — HEPARIN SOD (PORK) LOCK FLUSH 100 UNIT/ML IV SOLN
500.0000 [IU] | Freq: Once | INTRAVENOUS | Status: AC | PRN
  Administered 2022-02-26: 500 [IU]

## 2022-02-26 MED ORDER — SODIUM CHLORIDE 0.9% FLUSH
10.0000 mL | INTRAVENOUS | Status: DC | PRN
  Administered 2022-02-26: 10 mL

## 2022-02-26 MED ORDER — SODIUM CHLORIDE 0.9% FLUSH
10.0000 mL | INTRAVENOUS | Status: AC | PRN
  Administered 2022-02-26: 10 mL

## 2022-02-26 NOTE — Progress Notes (Signed)
Per Dr. Julien Nordmann ,it is okay to treat pt today with  Nivolumab and Alk phosphatase of 889.  Schedule message sent to schedule lab appt next week to recheck it.

## 2022-02-26 NOTE — Patient Instructions (Signed)
[facility]   Discharge Instructions: Thank you for choosing [facility] to provide your oncology and hematology care.   If you have a lab appointment with the Cancer Center, please go directly to the Cancer Center and check in at the registration area.   Wear comfortable clothing and clothing appropriate for easy access to any Portacath or PICC line.   We strive to give you quality time with your provider. You may need to reschedule your appointment if you arrive late (15 or more minutes).  Arriving late affects you and other patients whose appointments are after yours.  Also, if you miss three or more appointments without notifying the office, you may be dismissed from the clinic at the provider's discretion.      For prescription refill requests, have your pharmacy contact our office and allow 72 hours for refills to be completed.    Today you received the following chemotherapy and/or immunotherapy agents: nivolumab      To help prevent nausea and vomiting after your treatment, we encourage you to take your nausea medication as directed.  BELOW ARE SYMPTOMS THAT SHOULD BE REPORTED IMMEDIATELY: *FEVER GREATER THAN 100.4 F (38 C) OR HIGHER *CHILLS OR SWEATING *NAUSEA AND VOMITING THAT IS NOT CONTROLLED WITH YOUR NAUSEA MEDICATION *UNUSUAL SHORTNESS OF BREATH *UNUSUAL BRUISING OR BLEEDING *URINARY PROBLEMS (pain or burning when urinating, or frequent urination) *BOWEL PROBLEMS (unusual diarrhea, constipation, pain near the anus) TENDERNESS IN MOUTH AND THROAT WITH OR WITHOUT PRESENCE OF ULCERS (sore throat, sores in mouth, or a toothache) UNUSUAL RASH, SWELLING OR PAIN  UNUSUAL VAGINAL DISCHARGE OR ITCHING   Items with * indicate a potential emergency and should be followed up as soon as possible or go to the Emergency Department if any problems should occur.  Please show the CHEMOTHERAPY ALERT CARD or IMMUNOTHERAPY ALERT CARD at check-in  to the Emergency Department and triage nurse.  Should you have questions after your visit or need to cancel or reschedule your appointment, please contact [facility]  Dept: [phone]  and follow the prompts.  Office hours are 8:00 a.m. to 4:30 p.m. Monday - Friday. Please note that voicemails left after 4:00 p.m. may not be returned until the following business day.  We are closed weekends and major holidays. You have access to a nurse at all times for urgent questions. Please call the main number to the clinic Dept: [phone] and follow the prompts.   For any non-urgent questions, you may also contact your provider using MyChart. We now offer e-Visits for anyone 18 and older to request care online for non-urgent symptoms. For details visit [email or web address].   Also download the MyChart app! Go to the app store, search "MyChart", open the app, select [facility], and log in with your MyChart username and password.  Masks are optional in the cancer centers. If you would like for your care team to wear a mask while they are taking care of you, please let them know. You may have one support person who is at least 72 years old accompany you for your appointments. 

## 2022-02-26 NOTE — Progress Notes (Signed)
[place] Telephone:([phone]) [phone]   Fax:([phone]) [phone]  OFFICE PROGRESS NOTE  [name], [place] [address]  DIAGNOSIS: Recurrent non-small cell lung cancer initially diagnosed as stage IIIA (T1b, N2, M0) non-small cell lung cancer presented with right middle lobe pulmonary nodule, mediastinal lymphadenopathy and highly suspicious for small nodule in the left upper lobe that could change her stage to stage IV that could present another synchronous primary lesion in the left upper lobe. This was diagnosed in September 2017.  PRIOR THERAPY:  1) Concurrent chemoradiation with weekly carboplatin for AUC of 2 and paclitaxel 45 MG/M2 status post 6 cycles last dose was given 02/25/2016 with partial response. 2) Consolidation chemotherapy with carboplatin for AUC of 5 and paclitaxel 175 MG/M2 every 3 weeks with Neulasta support. First dose 04/22/2016. Status post 3 cycles. 3) Second line treatment with immunotherapy with Nivolumab 480 mg IV every 4 weeks status post 32 cycles.  Discontinued secondary to intolerance and frequent hospitalization with pneumonia and pneumonitis.  She had a break of treatment between November 24, 2019 until June 28, 2020.  CURRENT THERAPY: Resuming her treatment with immunotherapy with nivolumab 480 mg IV every 4 weeks.  Cycle #33 started on June 28, 2020.  Status post 53 cycles.  INTERVAL HISTORY: Courtney Zuniga 71 y.o. female returns to the clinic today for follow-up visit.  The patient is feeling fine except for the baseline shortness of breath and she is currently on home oxygen.  She continues to have mild cough and requesting refill of Hycodan.  She denied having any current chest pain or hemoptysis.  She has no nausea, vomiting, diarrhea or constipation.  She has no headache or visual changes.  She continues to tolerate her treatment with nivolumab fairly well.  She is here today for evaluation before starting cycle #54.      MEDICAL HISTORY: Past Medical History:  Diagnosis Date   Anemia    as a young woman   Arthritis    osteoartritis   Asthma    Brain tumor (benign) ([facility]) 2005 [facility]   Benign   Chronic headaches    Chronic hip pain    Chronic pain    COPD (chronic obstructive pulmonary disease) (H[facility])    Coronary artery disease    Depression    Depression 05/15/2016   Encounter for antineoplastic chemotherapy 01/10/2016   GERD (gastroesophageal reflux disease)    Hypertension    Lung cancer ([facility]) dx'd 01/2016   currently on chemo and radiation    NSTEMI (non-ST elevated myocardial infarction) ([place]) yrs ago   On home O2    qhs 2 liters at hs and prn   Pneumonia last time 2 yrs ago   Shortness of breath dyspnea    with activity    ALLERGIES:  is allergic to desyrel [trazodone] and no known allergies.  MEDICATIONS:  Current Outpatient Medications  Medication Sig Dispense Refill   acetaminophen (TYLENOL) 325 MG tablet Take 2 tablets (650 mg total) by mouth every 6 (six) hours as needed for mild pain, fever or headache. 12 tablet 2   albuterol (PROVENTIL HFA;VENTOLIN HFA) 108 (90 BASE) MCG/ACT inhaler Inhale 2 puffs into the lungs every 4 (four) hours as needed for shortness of breath.      atorvastatin (LIPITOR) 10 MG tablet Take 10 mg by mouth daily.     BREO ELLIPTA 100-25 MCG/ACT AEPB 1 puff daily.     Cholecalciferol (VITAMIN D3) 125  MCG (5000 UT) CAPS Take 1 capsule by mouth daily.  0   cyclobenzaprine (FLEXERIL) 10 MG tablet Take 1 tablet (10 mg total) by mouth 2 (two) times daily. *MAy take one additional tablet as needed for muscle spasms (Patient taking differently: Take 10 mg by mouth 2 (two) times daily. *May take one additional tablet as needed for muscle spasms) 60 tablet 2   diclofenac sodium (VOLTAREN) 1 % GEL Apply topically 4 (four) times daily as needed (massge gel into affected area(s) as needed for pain).   1   DULoxetine (CYMBALTA) 60 MG capsule Cymbalta 60 mg  capsule,delayed release  Take 1 capsule every day by oral route.  stop 30 mg capsules     Dupilumab (DUPIXENT) 300 MG/2ML SOPN Inject 300 mg into the skin every 14 (fourteen) days. Twice a month     ENSURE (ENSURE) Take 237 mLs by mouth 3 (three) times daily between meals.     HYDROcodone bit-homatropine (HYCODAN) 5-1.5 MG/5ML syrup Take 5 mLs by mouth every 6 (six) hours as needed for cough. 120 mL 0   Ipratropium-Albuterol (COMBIVENT RESPIMAT) 20-100 MCG/ACT AERS respimat INHALE 2 PUFFS BY MOUTH FOUR TIMES DAILY AS NEEDED FOR WHEEZING     ipratropium-albuterol (DUONEB) 0.5-2.5 (3) MG/3ML SOLN Take 3 mLs by nebulization 3 (three) times daily. 360 mL 2   lidocaine-prilocaine (EMLA) cream Apply to the Port-A-Cath site 30-60 minutes before treatment 30 g 0   megestrol (MEGACE ES) 625 MG/5ML suspension Take 5 mLs (625 mg total) by mouth daily. 150 mL 0   metoprolol tartrate (LOPRESSOR) 25 MG tablet Take 1 tablet (25 mg total) by mouth 2 (two) times daily. 60 tablet 0   oxyCODONE-acetaminophen (PERCOCET/ROXICET) 5-325 MG tablet Take 1 tablet by mouth every 8 (eight) hours as needed for moderate pain. 12 tablet 0   pantoprazole (PROTONIX) 20 MG tablet Take 20 mg by mouth daily. Not sure of dosage     predniSONE (DELTASONE) 20 MG tablet Take 1 tablet (20 mg total) by mouth daily with breakfast. 2 tablet 0   prochlorperazine (COMPAZINE) 10 MG tablet Take 1 tablet (10 mg total) by mouth every 6 (six) hours as needed for nausea or vomiting. 30 tablet 0   topiramate (TOPAMAX) 25 MG tablet Take 1 tablet by mouth daily at bedtime X 1 week; then increase to 25mg  BID. (Patient taking differently: Take 25 mg by mouth 2 (two) times daily.) 45 tablet 2   TRELEGY ELLIPTA 100-62.5-25 MCG/ACT AEPB Inhale 1 puff into the lungs daily.     Vitamin D, Ergocalciferol, (DRISDOL) 1.25 MG (50000 UNIT) CAPS capsule Take 50,000 Units by mouth once a week.     No current facility-administered medications for this visit.     SURGICAL HISTORY:  Past Surgical History:  Procedure Laterality Date   CHOLECYSTECTOMY     COLONOSCOPY  2015   Results requested from [facility]   COLONOSCOPY     ESOPHAGOGASTRODUODENOSCOPY N/A 08/14/2015   Procedure: ESOPHAGOGASTRODUODENOSCOPY (EGD);  Surgeon: Daneil Dolin, MD;  Location[facility];  Service: Endoscopy;  Laterality: N/A;  215    ESOPHAGOGASTRODUODENOSCOPY (EGD) WITH PROPOFOL N/A 09/13/2015   Procedure: ESOPHAGOGASTRODUODENOSCOPY (EGD) WITH PROPOFOL;  Surgeon: Milus Banister, MD;  Location: [facility];  Service: Endoscopy;  Laterality: N/A;   EUS N/A 03/12/2017   Procedure: UPPER ENDOSCOPIC ULTRASOUND (EUS) RADIAL;  Surgeon: Milus Banister, MD;  Location: [facility];  Service: Endoscopy;  Laterality: N/A;   IR IMAGING GUIDED PORT INSERTION  01/20/2022   TUMOR REMOVAL  2005   Benign   UPPER ESOPHAGEAL ENDOSCOPIC ULTRASOUND (EUS)  09/13/2015   Procedure: UPPER ESOPHAGEAL ENDOSCOPIC ULTRASOUND (EUS);  Surgeon: Milus Banister, MD;  Location: [facility];  Service: Endoscopy;;   VIDEO BRONCHOSCOPY WITH ENDOBRONCHIAL NAVIGATION N/A 12/31/2015   Procedure: VIDEO BRONCHOSCOPY WITH ENDOBRONCHIAL NAVIGATION;  Surgeon: Melrose Nakayama, MD;  Location: [facility];  Service: Thoracic;  Laterality: N/A;   VIDEO BRONCHOSCOPY WITH ENDOBRONCHIAL ULTRASOUND N/A 11/08/2015   Procedure: VIDEO BRONCHOSCOPY WITH ENDOBRONCHIAL ULTRASOUND;  Surgeon: Ivin Poot, MD;  Location: [facility] OR;  Service: Thoracic;  Laterality: N/A;    REVIEW OF SYSTEMS:  A comprehensive review of systems was negative except for: Constitutional: positive for fatigue Respiratory: positive for cough and dyspnea on exertion   PHYSICAL EXAMINATION: General appearance: alert, cooperative, fatigued, and no distress Head: Normocephalic, without obvious abnormality, atraumatic Neck: no adenopathy, no JVD, supple, symmetrical, trachea midline, and thyroid not enlarged, symmetric, no  tenderness/mass/nodules Lymph nodes: Cervical, supraclavicular, and axillary nodes normal. Resp: clear to auscultation bilaterally Back: symmetric, no curvature. ROM normal. No CVA tenderness. Cardio: regular rate and rhythm, S1, S2 normal, no murmur, click, rub or gallop GI: soft, non-tender; bowel sounds normal; no masses,  no organomegaly Extremities: extremities normal, atraumatic, no cyanosis or edema  ECOG PERFORMANCE STATUS: 1 - Symptomatic but completely ambulatory  Blood pressure (!) 116/45, pulse (!) 102, temperature 98.6 F (37 C), temperature source Oral, resp. rate 16, weight 123 lb 9.6 oz (56.1 kg), SpO2 96 %.  LABORATORY DATA: Lab Results  Component Value Date   WBC 4.7 02/26/2022   HGB 12.4 02/26/2022   HCT 35.4 (L) 02/26/2022   MCV 83.7 02/26/2022   PLT 267 02/26/2022      Chemistry      Component Value Date/Time   NA 144 01/29/2022 1054   NA 144 04/09/2017 0948   K 4.6 01/29/2022 1054   K 4.3 04/09/2017 0948   CL 105 01/29/2022 1054   CO2 33 (H) 01/29/2022 1054   CO2 25 04/09/2017 0948   BUN 16 01/29/2022 1054   BUN 16.8 04/09/2017 0948   CREATININE 0.56 01/29/2022 1054   CREATININE 0.8 04/09/2017 0948      Component Value Date/Time   CALCIUM 9.5 01/29/2022 1054   CALCIUM 9.1 04/09/2017 0948   ALKPHOS 292 (H) 01/29/2022 1054   ALKPHOS 216 (H) 04/09/2017 0948   AST 22 01/29/2022 1054   AST 12 04/09/2017 0948   ALT 11 01/29/2022 1054   ALT 13 04/09/2017 0948   BILITOT 0.2 (L) 01/29/2022 1054   BILITOT 0.22 04/09/2017 0948       RADIOGRAPHIC STUDIES:-   CT Chest W Contrast  Result Date: 01/29/2022 CLINICAL DATA:  71 year old female with history of non-small cell lung cancer. Staging examination. * Tracking Code: BO * EXAM: CT CHEST WITH CONTRAST TECHNIQUE: Multidetector CT imaging of the chest was performed during intravenous contrast administration. RADIATION DOSE REDUCTION: This exam was performed according to the departmental  dose-optimization program which includes automated exposure control, adjustment of the mA and/or kV according to patient size and/or use of iterative reconstruction technique. CONTRAST:  124mL OMNIPAQUE IOHEXOL 300 MG/ML  SOLN COMPARISON:  Chest CT 10/07/2021. FINDINGS: Cardiovascular: Heart size is normal. There is no significant pericardial fluid, thickening or pericardial calcification. There is aortic atherosclerosis, as well as atherosclerosis of the great vessels of the mediastinum and the coronary arteries, including calcified atherosclerotic plaque in the left main, left anterior descending,  left circumflex and right coronary arteries. Thickening and calcification of the aortic valve. Right internal jugular single-lumen Port-A-Cath with tip terminating at the superior cavoatrial junction. Mediastinum/Nodes: No pathologically enlarged mediastinal or hilar lymph nodes. Esophagus is unremarkable in appearance. No axillary lymphadenopathy. 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 Additionally, mass-like architectural distortion and chronic volume loss is noted in the perihilar aspect of the right lung with complete chronic atelectasis of the right middle lobe, similar to prior examinations, most compatible with areas of chronic postradiation mass-like fibrosis. Retained secretions are noted in the bronchus intermedius. No new suspicious appearing pulmonary nodules or masses are noted. No acute consolidative airspace disease. No pleural effusions. Diffuse bronchial wall thickening with severe centrilobular and paraseptal emphysema. Upper Abdomen: Aortic atherosclerosis. Musculoskeletal: Two well-defined densely sclerotic lesions in the lateral aspect of the right seventh rib, similar to prior  studies, likely bone islands. There are no other aggressive appearing lytic or blastic lesions noted in the visualized portions of the skeleton. IMPRESSION: 1. Stable examination demonstrating areas of postradiation fibrosis in the perihilar aspect of the right lung and in the right upper lobe. No findings to suggest recurrent or metastatic disease in the thorax on today's examination. 2. Diffuse bronchial wall thickening with severe centrilobular and paraseptal emphysema; imaging findings suggestive of underlying COPD. 3. Aortic atherosclerosis, in addition to left main and three-vessel coronary artery disease. Assessment for potential risk factor modification, dietary therapy or pharmacologic therapy may be warranted, if clinically indicated. 4. There are calcifications of the aortic valve. Echocardiographic correlation for evaluation of potential valvular dysfunction may be warranted if clinically indicated. Aortic Atherosclerosis (ICD10-I70.0) and Emphysema (ICD10-J43.9). Electronically Signed   By: Vinnie Langton M.D.   On: 01/29/2022 07:49     ASSESSMENT AND PLAN:  This is a very pleasant 71 years old African-American female with a recurrent non-small cell lung cancer initially diagnosed as stage IIIA non-small cell lung cancer status post concurrent chemoradiation followed by consolidation chemotherapy with carboplatin and paclitaxel for 3 cycles. The patient was followed by observation but restaging imaging studies showed evidence for disease progression. She was started on second line treatment with immunotherapy with Nivolumab 480 mg IV every 4 weeks status post 32 cycles.   The patient has been in observation since August 2021 with no concerning complaints except for the baseline shortness of breath and intermittent headache. The patient resumed her treatment with cycle number cycle #33 of single agent nivolumab 480 mg IV every 4 weeks in March 2022 status post 53 cycles. The patient has been  tolerating this treatment well with no concerning adverse effects. I recommended for her to proceed with cycle #54 of her treatment. For the history of COPD, she will continue on home oxygen and nebulizer as prescribed by her primary care physician and pulmonologist. For the cough, I will give her a refill of Hycodan. She will come back for follow-up visit in 4 weeks for evaluation before the next cycle of her treatment. She was advised to call immediately if she has any concerning symptoms in the interval. All questions were answered. The patient knows to call the clinic with any problems, questions or concerns. We can certainly see the patient much sooner if necessary.  Disclaimer: This note was dictated with voice recognition software. Similar sounding words  can inadvertently be transcribed and may not be corrected upon review.

## 2022-02-28 LAB — T4: T4, Total: 8.5 ug/dL (ref 4.5–12.0)

## 2022-03-03 ENCOUNTER — Inpatient Hospital Stay: Payer: Medicare Other

## 2022-03-03 ENCOUNTER — Other Ambulatory Visit: Payer: Self-pay

## 2022-03-03 ENCOUNTER — Encounter: Payer: Self-pay | Admitting: Internal Medicine

## 2022-03-03 DIAGNOSIS — C3491 Malignant neoplasm of unspecified part of right bronchus or lung: Secondary | ICD-10-CM

## 2022-03-03 DIAGNOSIS — Z5112 Encounter for antineoplastic immunotherapy: Secondary | ICD-10-CM | POA: Diagnosis not present

## 2022-03-03 DIAGNOSIS — Z95828 Presence of other vascular implants and grafts: Secondary | ICD-10-CM | POA: Insufficient documentation

## 2022-03-03 DIAGNOSIS — C3490 Malignant neoplasm of unspecified part of unspecified bronchus or lung: Secondary | ICD-10-CM

## 2022-03-03 LAB — CMP (CANCER CENTER ONLY)
ALT: 59 U/L — ABNORMAL HIGH (ref 0–44)
AST: 34 U/L (ref 15–41)
Albumin: 4.2 g/dL (ref 3.5–5.0)
Alkaline Phosphatase: 681 U/L — ABNORMAL HIGH (ref 38–126)
Anion gap: 6 (ref 5–15)
BUN: 14 mg/dL (ref 8–23)
CO2: 32 mmol/L (ref 22–32)
Calcium: 9.7 mg/dL (ref 8.9–10.3)
Chloride: 106 mmol/L (ref 98–111)
Creatinine: 0.4 mg/dL — ABNORMAL LOW (ref 0.44–1.00)
GFR, Estimated: >60 mL/min (ref >60)
Glucose, Bld: 84 mg/dL (ref 70–99)
Potassium: 3.9 mmol/L (ref 3.5–5.1)
Sodium: 144 mmol/L (ref 135–145)
Total Bilirubin: 0.3 mg/dL (ref 0.3–1.2)
Total Protein: 7.4 g/dL (ref 6.5–8.1)

## 2022-03-03 LAB — CBC WITH DIFFERENTIAL (CANCER CENTER ONLY)
Abs Immature Granulocytes: 0.02 10*3/uL (ref 0.00–0.07)
Basophils Absolute: 0 10*3/uL (ref 0.0–0.1)
Basophils Relative: 0 %
Eosinophils Absolute: 0 10*3/uL (ref 0.0–0.5)
Eosinophils Relative: 1 %
HCT: 37.1 % (ref 36.0–46.0)
Hemoglobin: 12.6 g/dL (ref 12.0–15.0)
Immature Granulocytes: 0 %
Lymphocytes Relative: 12 %
Lymphs Abs: 0.7 10*3/uL (ref 0.7–4.0)
MCH: 28.9 pg (ref 26.0–34.0)
MCHC: 34 g/dL (ref 30.0–36.0)
MCV: 85.1 fL (ref 80.0–100.0)
Monocytes Absolute: 0.3 10*3/uL (ref 0.1–1.0)
Monocytes Relative: 5 %
Neutro Abs: 4.8 10*3/uL (ref 1.7–7.7)
Neutrophils Relative %: 82 %
Platelet Count: 242 10*3/uL (ref 150–400)
RBC: 4.36 MIL/uL (ref 3.87–5.11)
RDW: 15.5 % (ref 11.5–15.5)
WBC Count: 5.9 10*3/uL (ref 4.0–10.5)
nRBC: 0 % (ref 0.0–0.2)

## 2022-03-03 LAB — TSH: TSH: 1.22 u[IU]/mL (ref 0.350–4.500)

## 2022-03-03 MED ORDER — SODIUM CHLORIDE 0.9% FLUSH
10.0000 mL | Freq: Once | INTRAVENOUS | Status: AC
  Administered 2022-03-03: 10 mL

## 2022-03-03 MED ORDER — HEPARIN SOD (PORK) LOCK FLUSH 100 UNIT/ML IV SOLN
500.0000 [IU] | Freq: Once | INTRAVENOUS | Status: AC
  Administered 2022-03-03: 500 [IU]

## 2022-03-18 ENCOUNTER — Other Ambulatory Visit: Payer: Self-pay

## 2022-03-24 NOTE — Progress Notes (Unsigned)
[facility] OFFICE PROGRESS NOTE  [name], [place] [address]  DIAGNOSIS: Recurrent non-small cell lung cancer initially diagnosed as stage IIIA (T1b, N2, M0) non-small cell lung cancer presented with right middle lobe pulmonary nodule, mediastinal lymphadenopathy and highly suspicious for small nodule in the left upper lobe that could change her stage to stage IV that could present another synchronous primary lesion in the left upper lobe. This was diagnosed in September 2017.   PRIOR THERAPY: 1) Concurrent chemoradiation with weekly carboplatin for AUC of 2 and paclitaxel 45 MG/M2 status post 6 cycles last dose was given 02/25/2016 with partial response. 2) Consolidation chemotherapy with carboplatin for AUC of 5 and paclitaxel 175 MG/M2 every 3 weeks with Neulasta support. First dose 04/22/2016. Status post 3 cycles. 3) Second line treatment with immunotherapy with Nivolumab 480 mg IV every 4 weeks status post 32 cycles.  Discontinued secondary to intolerance and frequent hospitalization with pneumonia and pneumonitis.  She had a break of treatment between November 24, 2019 until June 28, 2020.  CURRENT THERAPY:  Resuming her treatment with immunotherapy with nivolumab 480 mg IV every 4 weeks.  Cycle #33 started on June 28, 2020.  Status post 54 cycles.   INTERVAL HISTORY: Brooklyn Joshua 71 y.o. female returns to the clinic for a follow up visit. Overall, the patient reports that she tolerates treatment well without any side effects. In October, the patient was endorsing decreased appetite. Therefore, she was started on megace which improvement in her appetite. She also had been seeing a member of the nutritionist team. She gained 2 lbs. The patient does get baseline headache due to having a history of a brain tumor in 2005. She states she took a BC powder today.  She generally will take BC powder. She reports similar dyspnea and cough at her baseline. She  is on 4L/min home oxygen via nasal cannula. She reports for years she has had dyspnea with mild exertion and then will have racing heart and will need to rest. Denies any history of arrhythmia. Denies any fever, chills, night sweats, or weight loss. Denies any chest pain or hemoptysis.  Denies any nausea, vomiting, diarrhea, or constipation. Denies any rashes or skin changes. The patient is here today for evaluation and repeat blood work before starting cycle #55     MEDICAL HISTORY: Past Medical History:  Diagnosis Date   Anemia    as a young woman   Arthritis    osteoartritis   Asthma    Brain tumor (benign) ([facility]) 2005 [facility]   Benign   Chronic headaches    Chronic hip pain    Chronic pain    COPD (chronic obstructive pulmonary disease) (HCC)    Coronary artery disease    Depression    Depression 05/15/2016   Encounter for antineoplastic chemotherapy 01/10/2016   GERD (gastroesophageal reflux disease)    Hypertension    Lung cancer ([facility]) dx'd 01/2016   currently on chemo and radiation    NSTEMI (non-ST elevated myocardial infarction) ([facility]) yrs ago   On home O2    qhs 2 liters at hs and prn   Pneumonia last time 2 yrs ago   Shortness of breath dyspnea    with activity    ALLERGIES:  is allergic to desyrel [trazodone] and no known allergies.  MEDICATIONS:  Current Outpatient Medications  Medication Sig Dispense Refill   acetaminophen (TYLENOL) 325 MG tablet Take 2 tablets (650 mg total) by  mouth every 6 (six) hours as needed for mild pain, fever or headache. 12 tablet 2   albuterol (PROVENTIL HFA;VENTOLIN HFA) 108 (90 BASE) MCG/ACT inhaler Inhale 2 puffs into the lungs every 4 (four) hours as needed for shortness of breath.      atorvastatin (LIPITOR) 10 MG tablet Take 10 mg by mouth daily.     BREO ELLIPTA 100-25 MCG/ACT AEPB 1 puff daily.     Cholecalciferol (VITAMIN D3) 125 MCG (5000 UT) CAPS Take 1 capsule by mouth daily.  0   cyclobenzaprine (FLEXERIL) 10 MG tablet Take  1 tablet (10 mg total) by mouth 2 (two) times daily. *MAy take one additional tablet as needed for muscle spasms (Patient taking differently: Take 10 mg by mouth 2 (two) times daily. *May take one additional tablet as needed for muscle spasms) 60 tablet 2   diclofenac sodium (VOLTAREN) 1 % GEL Apply topically 4 (four) times daily as needed (massge gel into affected area(s) as needed for pain).   1   DULoxetine (CYMBALTA) 60 MG capsule Cymbalta 60 mg capsule,delayed release  Take 1 capsule every day by oral route.  stop 30 mg capsules     ENSURE (ENSURE) Take 237 mLs by mouth 3 (three) times daily between meals.     HYDROcodone bit-homatropine (HYCODAN) 5-1.5 MG/5ML syrup Take 5 mLs by mouth every 6 (six) hours as needed for cough. 120 mL 0   Ipratropium-Albuterol (COMBIVENT RESPIMAT) 20-100 MCG/ACT AERS respimat INHALE 2 PUFFS BY MOUTH FOUR TIMES DAILY AS NEEDED FOR WHEEZING     ipratropium-albuterol (DUONEB) 0.5-2.5 (3) MG/3ML SOLN Take 3 mLs by nebulization 3 (three) times daily. 360 mL 2   lidocaine-prilocaine (EMLA) cream Apply to the Port-A-Cath site 30-60 minutes before treatment 30 g 0   megestrol (MEGACE ES) 625 MG/5ML suspension Take 5 mLs (625 mg total) by mouth daily. 150 mL 0   metoprolol tartrate (LOPRESSOR) 25 MG tablet Take 1 tablet (25 mg total) by mouth 2 (two) times daily. 60 tablet 0   oxyCODONE-acetaminophen (PERCOCET/ROXICET) 5-325 MG tablet Take 1 tablet by mouth every 8 (eight) hours as needed for moderate pain. (Patient not taking: Reported on 02/26/2022) 12 tablet 0   pantoprazole (PROTONIX) 20 MG tablet Take 20 mg by mouth daily. Not sure of dosage     predniSONE (DELTASONE) 20 MG tablet Take 1 tablet (20 mg total) by mouth daily with breakfast. 2 tablet 0   prochlorperazine (COMPAZINE) 10 MG tablet Take 1 tablet (10 mg total) by mouth every 6 (six) hours as needed for nausea or vomiting. (Patient not taking: Reported on 02/26/2022) 30 tablet 0   topiramate (TOPAMAX) 25 MG  tablet Take 1 tablet by mouth daily at bedtime X 1 week; then increase to 25mg  BID. (Patient taking differently: Take 25 mg by mouth 2 (two) times daily.) 45 tablet 2   TRELEGY ELLIPTA 100-62.5-25 MCG/ACT AEPB Inhale 1 puff into the lungs daily.     Vitamin D, Ergocalciferol, (DRISDOL) 1.25 MG (50000 UNIT) CAPS capsule Take 50,000 Units by mouth once a week.     No current facility-administered medications for this visit.    SURGICAL HISTORY:  Past Surgical History:  Procedure Laterality Date   CHOLECYSTECTOMY     COLONOSCOPY  2015   Results requested from [facility]   COLONOSCOPY     ESOPHAGOGASTRODUODENOSCOPY N/A 08/14/2015   Procedure: ESOPHAGOGASTRODUODENOSCOPY (EGD);  Surgeon: Daneil Dolin, MD;  [facility];  Service: Endoscopy;  Laterality: N/A;  215    ESOPHAGOGASTRODUODENOSCOPY (EGD) WITH PROPOFOL N/A 09/13/2015   Procedure: ESOPHAGOGASTRODUODENOSCOPY (EGD) WITH PROPOFOL;  Surgeon: Milus Banister, MD;  Location: [facility];  Service: Endoscopy;  Laterality: N/A;   EUS N/A 03/12/2017   Procedure: UPPER ENDOSCOPIC ULTRASOUND (EUS) RADIAL;  Surgeon: Milus Banister, MD;  Location: [facility];  Service: Endoscopy;  Laterality: N/A;   IR IMAGING GUIDED PORT INSERTION  01/20/2022   TUMOR REMOVAL  2005   Benign   UPPER ESOPHAGEAL ENDOSCOPIC ULTRASOUND (EUS)  09/13/2015   Procedure: UPPER ESOPHAGEAL ENDOSCOPIC ULTRASOUND (EUS);  Surgeon: Milus Banister, MD;  Location: [facility];  Service: Endoscopy;;   VIDEO BRONCHOSCOPY WITH ENDOBRONCHIAL NAVIGATION N/A 12/31/2015   Procedure: VIDEO BRONCHOSCOPY WITH ENDOBRONCHIAL NAVIGATION;  Surgeon: Melrose Nakayama, MD;  Location: [facility];  Service: Thoracic;  Laterality: N/A;   VIDEO BRONCHOSCOPY WITH ENDOBRONCHIAL ULTRASOUND N/A 11/08/2015   Procedure: VIDEO BRONCHOSCOPY WITH ENDOBRONCHIAL ULTRASOUND;  Surgeon: Ivin Poot, MD;  Location: [facility];  Service: Thoracic;  Laterality: N/A;    REVIEW OF SYSTEMS:    Constitutional: Positive for baseline fatigue.  Negative for chills, fever and unexpected weight change.  HENT:   Negative for mouth sores, nosebleeds, sore throat and trouble swallowing.   Eyes: Negative for eye problems and icterus.  Respiratory: Positive for baseline dyspnea with minimal exertion.  Negative for hemoptysis, shortness of breath and wheezing.   Cardiovascular: Negative for chest pain and leg swelling.  Gastrointestinal: Negative for abdominal pain, constipation, diarrhea, nausea and vomiting.  Genitourinary: Negative for bladder incontinence, difficulty urinating, dysuria, frequency and hematuria.   Musculoskeletal: Negative for back pain, gait problem, neck pain and neck stiffness.  Skin: Negative for itching and rash.  Neurological:  Positive for frequent headaches. Negative for dizziness, headache, extremity weakness, gait problem, light-headedness and seizures.  Hematological: Negative for adenopathy. Does not bruise/bleed easily.  Psychiatric/Behavioral: Negative for confusion, depression and sleep disturbance. The patient is not nervous/anxious.   PHYSICAL EXAMINATION:  Blood pressure (!) 130/93, pulse 98, resp. rate 17, weight 125 lb (56.7 kg), SpO2 100 %.  ECOG PERFORMANCE STATUS: 1  Physical Exam  Constitutional: Oriented to person, place, and time and w thin appearing female and in no distress.  HENT:  Head: Normocephalic and atraumatic.  Mouth/Throat: Oropharynx is clear and moist. No oropharyngeal exudate.  Eyes: Conjunctivae are normal. Right eye exhibits no discharge. Left eye exhibits no discharge. No scleral icterus.  Neck: Normal range of motion. Neck supple.  Cardiovascular: Normal rate, irregular rhythm, normal heart sounds and intact distal pulses.   Pulmonary/Chest: Effort normal.  Quiet breath sounds in all lung fields.  No respiratory distress. No wheezes. No rales. On supplemental oxygen. Abdominal: Soft. Bowel sounds are normal. Exhibits no  distension and no mass. There is no tenderness.  Musculoskeletal: Normal range of motion. Exhibits no edema.  Lymphadenopathy:    No cervical adenopathy.  Neurological: Left facial twitching and weakness (baseline from brain tumor in 2005-stable/chronic). Alert and oriented to person, place, and time.  Exhibits muscle wasting.  The patient was examined in the wheelchair.  Skin: Skin is warm and dry. No rash noted. Not diaphoretic. No erythema. No pallor.  Psychiatric: Mood, memory and judgment normal.  Vitals reviewed.  LABORATORY DATA: Lab Results  Component Value Date   WBC 7.6 03/26/2022   HGB 12.0 03/26/2022   HCT 35.8 (L) 03/26/2022   MCV 83.3 03/26/2022   PLT 296 03/26/2022      Chemistry  Component Value Date/Time   NA 142 03/26/2022 1029   NA 144 04/09/2017 0948   K 4.7 03/26/2022 1029   K 4.3 04/09/2017 0948   CL 102 03/26/2022 1029   CO2 37 (H) 03/26/2022 1029   CO2 25 04/09/2017 0948   BUN 13 03/26/2022 1029   BUN 16.8 04/09/2017 0948   CREATININE 0.43 (L) 03/26/2022 1029   CREATININE 0.8 04/09/2017 0948      Component Value Date/Time   CALCIUM 9.9 03/26/2022 1029   CALCIUM 9.1 04/09/2017 0948   ALKPHOS 395 (H) 03/26/2022 1029   ALKPHOS 216 (H) 04/09/2017 0948   AST 24 03/26/2022 1029   AST 12 04/09/2017 0948   ALT 16 03/26/2022 1029   ALT 13 04/09/2017 0948   BILITOT 0.2 (L) 03/26/2022 1029   BILITOT 0.22 04/09/2017 0948       RADIOGRAPHIC STUDIES:  No results found.   ASSESSMENT/PLAN:  This is a very pleasant 71 year old African-American female with recurrent metastatic non-small cell lung cancer, adenocarcinoma of the right middle lobe.  She was initially diagnosed as stage IIIa in September 2017.     She completed concurrent chemoradiation followed by consolidation with carboplatin and paclitaxel. She is status post 3 cycles. The patient was on observation but a restaging CT scan showed evidence of disease progression.  She is currently  undergoing treatment with second line with immunotherapy with nivolumab 480 mg IV every 4 weeks.  She was status post 31 cycles. Her treatment had been on hold since August 2021 until her COPD can get under control. She showed evidence of some recurrent disease in March 2022 and she resumed her treatment with cycle number cycle #33 of single agent nivolumab 480 mg IV every 4 weeks in March 2022 status post 54 cycles.     Labs were reviewed.  Recommend that she proceed with cycle #55 today as scheduled.  We will see her back for a follow up visit in 4 weeks before starting cycle #56.    She will continue taking megace.   Her heart rate is irregular today. Will arrange EKG to ensure no arrhythmia. Her EKG showed sinus tachycardia with rate 105 and frequent PVC. May consider referral to cardiology for monitoring but she is asymptomatic.   The patient previously mentioned that the IV contrast for her last CT scan "burned"/hurt.  Discussed that for her next restaging CT scan that we can make her an appointment here at [facility] to access her port to use for the IV contrast in the future.   The patient was advised to call immediately if she has any concerning symptoms in the interval. The patient voices understanding of current disease status and treatment options and is in agreement with the current care plan. All questions were answered. The patient knows to call the clinic with any problems, questions or concerns. We can certainly see the patient much sooner if necessary    Orders Placed This Encounter  Procedures   EKG 12-Lead     The total time spent in the appointment was 20-29 minutes.   Beryl Hornberger L Takara Sermons, PA-C 03/26/22

## 2022-03-26 ENCOUNTER — Other Ambulatory Visit: Payer: Self-pay

## 2022-03-26 ENCOUNTER — Inpatient Hospital Stay: Payer: Medicare Other | Attending: Physician Assistant

## 2022-03-26 ENCOUNTER — Inpatient Hospital Stay: Payer: Medicare Other

## 2022-03-26 ENCOUNTER — Other Ambulatory Visit: Payer: Medicare Other

## 2022-03-26 ENCOUNTER — Inpatient Hospital Stay (HOSPITAL_BASED_OUTPATIENT_CLINIC_OR_DEPARTMENT_OTHER): Payer: Medicare Other | Admitting: Physician Assistant

## 2022-03-26 VITALS — BP 130/93 | HR 98 | Resp 17 | Wt 125.0 lb

## 2022-03-26 VITALS — BP 109/72 | HR 108 | Temp 98.2°F | Resp 17

## 2022-03-26 DIAGNOSIS — I493 Ventricular premature depolarization: Secondary | ICD-10-CM

## 2022-03-26 DIAGNOSIS — C342 Malignant neoplasm of middle lobe, bronchus or lung: Secondary | ICD-10-CM | POA: Diagnosis present

## 2022-03-26 DIAGNOSIS — C3491 Malignant neoplasm of unspecified part of right bronchus or lung: Secondary | ICD-10-CM | POA: Diagnosis not present

## 2022-03-26 DIAGNOSIS — Z5112 Encounter for antineoplastic immunotherapy: Secondary | ICD-10-CM

## 2022-03-26 DIAGNOSIS — R000 Tachycardia, unspecified: Secondary | ICD-10-CM | POA: Insufficient documentation

## 2022-03-26 DIAGNOSIS — Z79899 Other long term (current) drug therapy: Secondary | ICD-10-CM | POA: Insufficient documentation

## 2022-03-26 DIAGNOSIS — Z95828 Presence of other vascular implants and grafts: Secondary | ICD-10-CM

## 2022-03-26 LAB — CBC WITH DIFFERENTIAL (CANCER CENTER ONLY)
Abs Immature Granulocytes: 0.02 10*3/uL (ref 0.00–0.07)
Basophils Absolute: 0 10*3/uL (ref 0.0–0.1)
Basophils Relative: 0 %
Eosinophils Absolute: 0.1 10*3/uL (ref 0.0–0.5)
Eosinophils Relative: 1 %
HCT: 35.8 % — ABNORMAL LOW (ref 36.0–46.0)
Hemoglobin: 12 g/dL (ref 12.0–15.0)
Immature Granulocytes: 0 %
Lymphocytes Relative: 8 %
Lymphs Abs: 0.6 10*3/uL — ABNORMAL LOW (ref 0.7–4.0)
MCH: 27.9 pg (ref 26.0–34.0)
MCHC: 33.5 g/dL (ref 30.0–36.0)
MCV: 83.3 fL (ref 80.0–100.0)
Monocytes Absolute: 0.4 10*3/uL (ref 0.1–1.0)
Monocytes Relative: 6 %
Neutro Abs: 6.4 10*3/uL (ref 1.7–7.7)
Neutrophils Relative %: 85 %
Platelet Count: 296 10*3/uL (ref 150–400)
RBC: 4.3 MIL/uL (ref 3.87–5.11)
RDW: 15.6 % — ABNORMAL HIGH (ref 11.5–15.5)
WBC Count: 7.6 10*3/uL (ref 4.0–10.5)
nRBC: 0 % (ref 0.0–0.2)

## 2022-03-26 LAB — CMP (CANCER CENTER ONLY)
ALT: 16 U/L (ref 0–44)
AST: 24 U/L (ref 15–41)
Albumin: 4 g/dL (ref 3.5–5.0)
Alkaline Phosphatase: 395 U/L — ABNORMAL HIGH (ref 38–126)
Anion gap: 3 — ABNORMAL LOW (ref 5–15)
BUN: 13 mg/dL (ref 8–23)
CO2: 37 mmol/L — ABNORMAL HIGH (ref 22–32)
Calcium: 9.9 mg/dL (ref 8.9–10.3)
Chloride: 102 mmol/L (ref 98–111)
Creatinine: 0.43 mg/dL — ABNORMAL LOW (ref 0.44–1.00)
GFR, Estimated: >60 mL/min (ref >60)
Glucose, Bld: 105 mg/dL — ABNORMAL HIGH (ref 70–99)
Potassium: 4.7 mmol/L (ref 3.5–5.1)
Sodium: 142 mmol/L (ref 135–145)
Total Bilirubin: 0.2 mg/dL — ABNORMAL LOW (ref 0.3–1.2)
Total Protein: 6.9 g/dL (ref 6.5–8.1)

## 2022-03-26 LAB — TSH: TSH: 2.56 u[IU]/mL (ref 0.350–4.500)

## 2022-03-26 MED ORDER — SODIUM CHLORIDE 0.9% FLUSH
10.0000 mL | Freq: Once | INTRAVENOUS | Status: AC
  Administered 2022-03-26: 10 mL

## 2022-03-26 MED ORDER — HEPARIN SOD (PORK) LOCK FLUSH 100 UNIT/ML IV SOLN
500.0000 [IU] | Freq: Once | INTRAVENOUS | Status: AC | PRN
  Administered 2022-03-26: 500 [IU]

## 2022-03-26 MED ORDER — SODIUM CHLORIDE 0.9% FLUSH
10.0000 mL | INTRAVENOUS | Status: DC | PRN
  Administered 2022-03-26: 10 mL

## 2022-03-26 MED ORDER — SODIUM CHLORIDE 0.9 % IV SOLN
480.0000 mg | Freq: Once | INTRAVENOUS | Status: AC
  Administered 2022-03-26: 480 mg via INTRAVENOUS
  Filled 2022-03-26: qty 48

## 2022-03-26 MED ORDER — SODIUM CHLORIDE 0.9 % IV SOLN: Freq: Once | INTRAVENOUS | Status: AC

## 2022-03-26 NOTE — Patient Instructions (Signed)
[facility] MEDICAL [facility]COLOGY   Discharge Instructions: Thank you for choosing [facility] to provide your oncology and hematology care.   If you have a lab appointment with the Cancer Center, please go directly to the Cancer Center and check in at the registration area.   Wear comfortable clothing and clothing appropriate for easy access to any Portacath or PICC line.   We strive to give you quality time with your provider. You may need to reschedule your appointment if you arrive late (15 or more minutes).  Arriving late affects you and other patients whose appointments are after yours.  Also, if you miss three or more appointments without notifying the office, you may be dismissed from the clinic at the provider's discretion.      For prescription refill requests, have your pharmacy contact our office and allow 72 hours for refills to be completed.    Today you received the following chemotherapy and/or immunotherapy agents: nivolumab      To help prevent nausea and vomiting after your treatment, we encourage you to take your nausea medication as directed.  BELOW ARE SYMPTOMS THAT SHOULD BE REPORTED IMMEDIATELY: *FEVER GREATER THAN 100.4 F (38 C) OR HIGHER *CHILLS OR SWEATING *NAUSEA AND VOMITING THAT IS NOT CONTROLLED WITH YOUR NAUSEA MEDICATION *UNUSUAL SHORTNESS OF BREATH *UNUSUAL BRUISING OR BLEEDING *URINARY PROBLEMS (pain or burning when urinating, or frequent urination) *BOWEL PROBLEMS (unusual diarrhea, constipation, pain near the anus) TENDERNESS IN MOUTH AND THROAT WITH OR WITHOUT PRESENCE OF ULCERS (sore throat, sores in mouth, or a toothache) UNUSUAL RASH, SWELLING OR PAIN  UNUSUAL VAGINAL DISCHARGE OR ITCHING   Items with * indicate a potential emergency and should be followed up as soon as possible or go to the Emergency Department if any problems should occur.  Please show the CHEMOTHERAPY ALERT CARD or IMMUNOTHERAPY ALERT CARD at check-in  to the Emergency Department and triage nurse.  Should you have questions after your visit or need to cancel or reschedule your appointment, please contact [facility]  Dept: [phone]  and follow the prompts.  Office hours are 8:00 a.m. to 4:30 p.m. Monday - Friday. Please note that voicemails left after 4:00 p.m. may not be returned until the following business day.  We are closed weekends and major holidays. You have access to a nurse at all times for urgent questions. Please call the main number to the clinic Dept: [phone] and follow the prompts.   For any non-urgent questions, you may also contact your provider using MyChart. We now offer e-Visits for anyone 18 and older to request care online for non-urgent symptoms. For details visit [email or web address].   Also download the MyChart app! Go to the app store, search "MyChart", open the app, select [facility], and log in with your MyChart username and password.  Masks are optional in the cancer centers. If you would like for your care team to wear a mask while they are taking care of you, please let them know. You may have one support person who is at least 72 years old accompany you for your appointments. 

## 2022-03-28 ENCOUNTER — Other Ambulatory Visit: Payer: Self-pay

## 2022-03-28 LAB — T4: T4, Total: 8.5 ug/dL (ref 4.5–12.0)

## 2022-04-02 ENCOUNTER — Other Ambulatory Visit: Payer: Self-pay

## 2022-04-09 ENCOUNTER — Encounter: Payer: Self-pay | Admitting: Internal Medicine

## 2022-04-09 ENCOUNTER — Ambulatory Visit: Payer: Medicare Other | Attending: Internal Medicine | Admitting: Internal Medicine

## 2022-04-09 ENCOUNTER — Ambulatory Visit (INDEPENDENT_AMBULATORY_CARE_PROVIDER_SITE_OTHER): Payer: Medicare Other

## 2022-04-09 VITALS — BP 100/59 | HR 102 | Ht 66.0 in | Wt 131.0 lb

## 2022-04-09 DIAGNOSIS — I493 Ventricular premature depolarization: Secondary | ICD-10-CM

## 2022-04-09 DIAGNOSIS — R002 Palpitations: Secondary | ICD-10-CM

## 2022-04-09 DIAGNOSIS — R079 Chest pain, unspecified: Secondary | ICD-10-CM | POA: Diagnosis not present

## 2022-04-09 NOTE — Addendum Note (Signed)
Addended by: Christella Scheuermann C on: 04/09/2022 01:01 PM   Modules accepted: Orders

## 2022-04-09 NOTE — Patient Instructions (Addendum)
Medication Instructions:   Your physician recommends that you continue on your current medications as directed. Please refer to the Current Medication list given to you today.  *If you need a refill on your cardiac medications before your next appointment, please call your pharmacy*  Lab Work: NONE ordered at this time of appointment   If you have labs (blood work) drawn today and your tests are completely normal, you will receive your results only by: [facility] (if you have MyChart) OR A paper copy in the mail If you have any lab test that is abnormal or we need to change your treatment, we will call you to review the results.  Testing/Procedures:  Your physician has requested that you have an echocardiogram. Echocardiography is a painless test that uses sound waves to create images of your heart. It provides your doctor with information about the size and shape of your heart and how well your heart's chambers and valves are working. This procedure takes approximately one hour. There are no restrictions for this procedure. Please do NOT wear cologne, perfume, aftershave, or lotions (deodorant is allowed). Please arrive 15 minutes prior to your appointment time.  ZIO XT- Long Term Monitor Instructions  Your physician has requested you wear a ZIO patch monitor for 7 days.  This is a single patch monitor. [employer] supplies one patch monitor per enrollment. Additional stickers are not available. Please do not apply patch if you will be having a Nuclear Stress Test,  Echocardiogram, Cardiac CT, MRI, or Chest Xray during the period you would be wearing the  monitor. The patch cannot be worn during these tests. You cannot remove and re-apply the  ZIO XT patch monitor.  Your ZIO patch monitor will be mailed 3 day USPS to your address on file. It may take 3-5 days  to receive your monitor after you have been enrolled.  Once you have received your monitor, please review the enclosed  instructions. Your monitor  has already been registered assigning a specific monitor serial # to you.  Billing and Patient Assistance Program Information  We have supplied [employer] with any of your insurance information on file for billing purposes. [employer] offers a sliding scale Patient Assistance Program for patients that do not have  insurance, or whose insurance does not completely cover the cost of the ZIO monitor.  You must apply for the Patient Assistance Program to qualify for this discounted rate.  To apply, please call [employer] at [phone], select option 4, select option 2, ask to apply for  Patient Assistance Program. Theodore Demark will ask your household income, and how many people  are in your household. They will quote your out-of-pocket cost based on that information.  [employer] will also be able to set up a 74-month, interest-free payment plan if needed.  Applying the monitor   Shave hair from upper left chest.  Hold abrader disc by orange tab. Rub abrader in 40 strokes over the upper left chest as  indicated in your monitor instructions.  Clean area with 4 enclosed alcohol pads. Let dry.  Apply patch as indicated in monitor instructions. Patch will be placed under collarbone on left  side of chest with arrow pointing upward.  Rub patch adhesive wings for 2 minutes. Remove white label marked "1". Remove the white  label marked "2". Rub patch adhesive wings for 2 additional minutes.  While looking in a mirror, press and release button in center of patch. A small green light will  flash 3-4  times. This will be your only indicator that the monitor has been turned on.  Do not shower for the first 24 hours. You may shower after the first 24 hours.  Press the button if you feel a symptom. You will hear a small click. Record Date, Time and  Symptom in the Patient Logbook.  When you are ready to remove the patch, follow instructions on the last 2 pages of Patient  Logbook. Stick patch  monitor onto the last page of Patient Logbook.  Place Patient Logbook in the blue and white box. Use locking tab on box and tape box closed  securely. The blue and white box has prepaid postage on it. Please place it in the mailbox as  soon as possible. Your physician should have your test results approximately 7 days after the  monitor has been mailed back to [facility].  Call [place] at [phone] if you have questions regarding  your ZIO XT patch monitor. Call them immediately if you see an orange light blinking on your  monitor.  If your monitor falls off in less than 4 days, contact our Monitor department at [phone].  If your monitor becomes loose or falls off after 4 days call [employer] at [phone] for  suggestions on securing your monitor   Follow-Up: At [facility], you and your health needs are our priority.  As part of our continuing mission to provide you with exceptional heart care, we have created designated Provider Care Teams.  These Care Teams include your primary Cardiologist (physician) and Advanced Practice Providers (APPs -  Physician Assistants and Nurse Practitioners) who all work together to provide you with the care you need, when you need it.  We recommend signing up for the patient portal called "MyChart".  Sign up information is provided on this After Visit Summary.  MyChart is used to connect with patients for Virtual Visits (Telemedicine).  Patients are able to view lab/test results, encounter notes, upcoming appointments, etc.  Non-urgent messages can be sent to your provider as well.   To learn more about what you can do with MyChart, go to [email or web address].    Your next appointment:   As needed if you develop new and/or worsen symptoms or fail to improve   The format for your next appointment:   In Person  Provider:   You may see Vishnu P Mallipeddi, MD or one of the following Advanced Practice Providers  on your designated Care Team:   [place], PA-C  Ermalinda Barrios, PA-C     Other Instructions  Important Information About [place]

## 2022-04-09 NOTE — Progress Notes (Signed)
Cardiology Office Note  Date: 04/09/2022   ID: Courtney Zuniga, DOB 07/18/1950, MRN [id number]  PCP:  [name], FNP  Cardiologist:  Chalmers Guest, MD Electrophysiologist:  None   Reason for Office Visit: Sinus tachycardia evaluation at the request of Heilingoetter, PA-C   History of Present Illness: Courtney Zuniga is a 71 y.o. female known to have recurrent non-small cell lung cancer since 2017, COPD on home oxygen, HTN was referred to cardiology clinic for evaluation of PVCs.  Patient was diagnosed with non-small cell lung cancer in 2017 on chemoradiation followed by progression of the cancer. She is currently on chemotherapy. Since cancer diagnosis in 2017, she has been having palpitations with exertional activities. Associated with some chest pain during the palpitations. currently she does not have any energy to perform any of her daily activities. She has some help at home.  EKG from 03/26/2022 showed frequent PVCs and sinus tachycardia due to which she was referred to cardiology clinic.  EKG today showed sinus tachycardia with no PVCs. Denied any dizziness/ lightheadedness or syncope. Former smoker, currently not smoking, denied alcohol use or illicit drug abuse. No family history of premature ASCVD.  Past Medical History:  Diagnosis Date   Anemia    as a young woman   Arthritis    osteoartritis   Asthma    Brain tumor (benign) ([place]) 2005 [facility]   Benign   Chronic headaches    Chronic hip pain    Chronic pain    COPD (chronic obstructive pulmonary disease) (HCC)    Coronary artery disease    Depression    Depression 05/15/2016   Encounter for antineoplastic chemotherapy 01/10/2016   GERD (gastroesophageal reflux disease)    Hypertension    Lung cancer ([place]) dx'd 01/2016   currently on chemo and radiation    NSTEMI (non-ST elevated myocardial infarction) ([place]) yrs ago   On home O2    qhs 2 liters at hs and prn   Pneumonia last time 2 yrs ago   Shortness  of breath dyspnea    with activity    Past Surgical History:  Procedure Laterality Date   CHOLECYSTECTOMY     COLONOSCOPY  2015   Results requested from [facility]   COLONOSCOPY     ESOPHAGOGASTRODUODENOSCOPY N/A 08/14/2015   Procedure: ESOPHAGOGASTRODUODENOSCOPY (EGD);  Surgeon: Daneil Dolin, MD;  [facility];  Service: Endoscopy;  Laterality: N/A;  215    ESOPHAGOGASTRODUODENOSCOPY (EGD) WITH PROPOFOL N/A 09/13/2015   Procedure: ESOPHAGOGASTRODUODENOSCOPY (EGD) WITH PROPOFOL;  Surgeon: Milus Banister, MD;  Location: [facility];  Service: Endoscopy;  Laterality: N/A;   EUS N/A 03/12/2017   Procedure: UPPER ENDOSCOPIC ULTRASOUND (EUS) RADIAL;  Surgeon: Milus Banister, MD;  Location: [facility];  Service: Endoscopy;  Laterality: N/A;   IR IMAGING GUIDED PORT INSERTION  01/20/2022   TUMOR REMOVAL  2005   Benign   UPPER ESOPHAGEAL ENDOSCOPIC ULTRASOUND (EUS)  09/13/2015   Procedure: UPPER ESOPHAGEAL ENDOSCOPIC ULTRASOUND (EUS);  Surgeon: Milus Banister, MD;  Location: [facility];  Service: Endoscopy;;   VIDEO BRONCHOSCOPY WITH ENDOBRONCHIAL NAVIGATION N/A 12/31/2015   Procedure: VIDEO BRONCHOSCOPY WITH ENDOBRONCHIAL NAVIGATION;  Surgeon: Melrose Nakayama, MD;  Location: [place];  Service: Thoracic;  Laterality: N/A;   VIDEO BRONCHOSCOPY WITH ENDOBRONCHIAL ULTRASOUND N/A 11/08/2015   Procedure: VIDEO BRONCHOSCOPY WITH ENDOBRONCHIAL ULTRASOUND;  Surgeon: Ivin Poot, MD;  Location: [facility];  Service: Thoracic;  Laterality: N/A;    Current Outpatient  Medications  Medication Sig Dispense Refill   acetaminophen (TYLENOL) 325 MG tablet Take 2 tablets (650 mg total) by mouth every 6 (six) hours as needed for mild pain, fever or headache. 12 tablet 2   albuterol (PROVENTIL HFA;VENTOLIN HFA) 108 (90 BASE) MCG/ACT inhaler Inhale 2 puffs into the lungs every 4 (four) hours as needed for shortness of breath.      atorvastatin (LIPITOR) 10 MG tablet Take 10 mg by mouth  daily.     BREO ELLIPTA 100-25 MCG/ACT AEPB 1 puff daily.     Cholecalciferol (VITAMIN D3) 125 MCG (5000 UT) CAPS Take 1 capsule by mouth daily.  0   cyclobenzaprine (FLEXERIL) 10 MG tablet Take 1 tablet (10 mg total) by mouth 2 (two) times daily. *MAy take one additional tablet as needed for muscle spasms (Patient taking differently: Take 10 mg by mouth 2 (two) times daily. *May take one additional tablet as needed for muscle spasms) 60 tablet 2   diclofenac sodium (VOLTAREN) 1 % GEL Apply topically 4 (four) times daily as needed (massge gel into affected area(s) as needed for pain).   1   DULoxetine (CYMBALTA) 60 MG capsule Cymbalta 60 mg capsule,delayed release  Take 1 capsule every day by oral route.  stop 30 mg capsules     ENSURE (ENSURE) Take 237 mLs by mouth 3 (three) times daily between meals.     HYDROcodone bit-homatropine (HYCODAN) 5-1.5 MG/5ML syrup Take 5 mLs by mouth every 6 (six) hours as needed for cough. 120 mL 0   Ipratropium-Albuterol (COMBIVENT RESPIMAT) 20-100 MCG/ACT AERS respimat INHALE 2 PUFFS BY MOUTH FOUR TIMES DAILY AS NEEDED FOR WHEEZING     ipratropium-albuterol (DUONEB) 0.5-2.5 (3) MG/3ML SOLN Take 3 mLs by nebulization 3 (three) times daily. 360 mL 2   lidocaine-prilocaine (EMLA) cream Apply to the Port-A-Cath site 30-60 minutes before treatment 30 g 0   megestrol (MEGACE ES) 625 MG/5ML suspension Take 5 mLs (625 mg total) by mouth daily. 150 mL 0   oxyCODONE-acetaminophen (PERCOCET/ROXICET) 5-325 MG tablet Take 1 tablet by mouth every 8 (eight) hours as needed for moderate pain. 12 tablet 0   pantoprazole (PROTONIX) 20 MG tablet Take 20 mg by mouth daily. Not sure of dosage     predniSONE (DELTASONE) 20 MG tablet Take 1 tablet (20 mg total) by mouth daily with breakfast. 2 tablet 0   prochlorperazine (COMPAZINE) 10 MG tablet Take 1 tablet (10 mg total) by mouth every 6 (six) hours as needed for nausea or vomiting. 30 tablet 0   topiramate (TOPAMAX) 25 MG tablet Take  1 tablet by mouth daily at bedtime X 1 week; then increase to 25mg  BID. (Patient taking differently: Take 25 mg by mouth 2 (two) times daily.) 45 tablet 2   TRELEGY ELLIPTA 100-62.5-25 MCG/ACT AEPB Inhale 1 puff into the lungs daily.     Vitamin D, Ergocalciferol, (DRISDOL) 1.25 MG (50000 UNIT) CAPS capsule Take 50,000 Units by mouth once a week.     metoprolol tartrate (LOPRESSOR) 25 MG tablet Take 1 tablet (25 mg total) by mouth 2 (two) times daily. 60 tablet 0   No current facility-administered medications for this visit.   Allergies:  Desyrel [trazodone] and No known allergies   Social History: The patient  reports that she quit smoking about 3 years ago. Her smoking use included cigarettes. She has a 38.00 pack-year smoking history. She has never used smokeless tobacco. She reports that she does not drink alcohol and does not use  drugs.   Family History: The patient's family history includes Brain cancer in her sister; Lung cancer in her brother, brother, and father; Ovarian cancer in her mother; Prostate cancer in her brother.   ROS:  Please see the history of present illness. Otherwise, complete review of systems is positive for none.  All other systems are reviewed and negative.   Physical Exam: VS:  BP (!) 100/59   Pulse (!) 102   Ht 5\' 6"  (1.676 m)   Wt 131 lb (59.4 kg)   BMI 21.14 kg/m , BMI Body mass index is 21.14 kg/m.  Wt Readings from Last 3 Encounters:  04/09/22 131 lb (59.4 kg)  03/26/22 125 lb (56.7 kg)  02/26/22 123 lb 9.6 oz (56.1 kg)    General: Patient appears comfortable at rest. HEENT: Conjunctiva and lids normal, oropharynx clear with moist mucosa. Neck: Supple, no elevated JVP or carotid bruits, no thyromegaly. Lungs: Clear to auscultation, nonlabored breathing at rest. Cardiac: Regular rate and rhythm, no S3 or significant systolic murmur, no pericardial rub. Abdomen: Soft, nontender, no hepatomegaly, bowel sounds present, no guarding or  rebound. Extremities: No pitting edema, distal pulses 2+. Skin: Warm and dry. Musculoskeletal: No kyphosis. Neuropsychiatric: Alert and oriented x3, affect grossly appropriate.  ECG:  An ECG dated 04/09/22 was personally reviewed today and demonstrated:  Sinus tachycardia  Recent Labwork: 03/26/2022: ALT 16; AST 24; BUN 13; Creatinine 0.43; Hemoglobin 12.0; Platelet Count 296; Potassium 4.7; Sodium 142; TSH 2.560  No results found for: "CHOL", "TRIG", "HDL", "CHOLHDL", "VLDL", "LDLCALC", "LDLDIRECT"  Other Studies Reviewed Today: Echo from 2017 LVEF normal No valve abnormalities  Assessment and Plan:  Patient is a 71 year old F known to have recurrent lung cancer, COPD on home oxygen was referred to cardiology clinic for evaluation of PVCs.  # Frequent PVCs on EKG -EKG from 03/26/2022 showed frequent PVCs however EKG today showed sinus tachycardia with no PVCs. Obtain 1 week event monitor to rule out ventricular arrhythmias and quantify PVC burden.  # Chest pain of uncertain etiology -Patient has less than 4 METS. She cannot attempt to perform her daily activities without becoming SOB. She has help at home for ADLs. I do not think she will benefit from ischemic evaluation given progression of cancer. -Obtain 2D Echocardiogram  I have spent a total of 44 minutes with patient reviewing chart, EKGs, labs and examining patient as well as establishing an assessment and plan that was discussed with the patient.  > 50% of time was spent in direct patient care.     Medication Adjustments/Labs and Tests Ordered: Current medicines are reviewed at length with the patient today.  Concerns regarding medicines are outlined above.   Tests Ordered: Orders Placed This Encounter  Procedures   LONG TERM MONITOR (3-14 DAYS)   ECHOCARDIOGRAM COMPLETE    Medication Changes: No orders of the defined types were placed in this encounter.   Disposition:  Follow up prn  Signed, Javid Kemler Fidel Levy, MD, 04/09/2022 11:28 AM    [facility] [address], [place], [place] [address]

## 2022-04-18 ENCOUNTER — Ambulatory Visit (HOSPITAL_COMMUNITY)
Admission: RE | Admit: 2022-04-18 | Discharge: 2022-04-18 | Disposition: A | Discharge status: Home / Self Care | Payer: Medicare Other | Source: Ambulatory Visit | Attending: Internal Medicine | Admitting: Internal Medicine

## 2022-04-18 DIAGNOSIS — R079 Chest pain, unspecified: Secondary | ICD-10-CM | POA: Diagnosis not present

## 2022-04-18 LAB — ECHOCARDIOGRAM COMPLETE
AR max vel: 1.26 cm2
AV Area VTI: 1.36 cm2
AV Area mean vel: 1.49 cm2
AV Mean grad: 9 mmHg
AV Peak grad: 23 mmHg
Ao pk vel: 2.4 m/s
Area-P 1/2: 3.65 cm2
P 1/2 time: 271 msec
S' Lateral: 2.3 cm

## 2022-04-18 NOTE — Progress Notes (Signed)
*  PRELIMINARY RESULTS* Echocardiogram 2D Echocardiogram has been performed.  Courtney Zuniga 04/18/2022, 1:58 PM

## 2022-04-23 ENCOUNTER — Inpatient Hospital Stay: Payer: Medicare Other | Admitting: Internal Medicine

## 2022-04-23 ENCOUNTER — Inpatient Hospital Stay: Payer: Medicare Other | Admitting: Dietician

## 2022-04-23 ENCOUNTER — Inpatient Hospital Stay: Payer: Medicare Other

## 2022-04-23 ENCOUNTER — Other Ambulatory Visit: Payer: Self-pay

## 2022-04-23 ENCOUNTER — Inpatient Hospital Stay (HOSPITAL_COMMUNITY)
Admission: EM | Admit: 2022-04-23 | Discharge: 2022-04-27 | DRG: 193 | Disposition: A | Discharge status: Home Health | Payer: Medicare Other | Attending: Family Medicine | Admitting: Family Medicine

## 2022-04-23 ENCOUNTER — Encounter (HOSPITAL_COMMUNITY): Payer: Self-pay

## 2022-04-23 ENCOUNTER — Emergency Department (HOSPITAL_COMMUNITY): Payer: Medicare Other

## 2022-04-23 DIAGNOSIS — Z8042 Family history of malignant neoplasm of prostate: Secondary | ICD-10-CM | POA: Diagnosis not present

## 2022-04-23 DIAGNOSIS — K5900 Constipation, unspecified: Secondary | ICD-10-CM | POA: Diagnosis present

## 2022-04-23 DIAGNOSIS — F419 Anxiety disorder, unspecified: Secondary | ICD-10-CM | POA: Diagnosis present

## 2022-04-23 DIAGNOSIS — J9621 Acute and chronic respiratory failure with hypoxia: Secondary | ICD-10-CM | POA: Diagnosis present

## 2022-04-23 DIAGNOSIS — Z85118 Personal history of other malignant neoplasm of bronchus and lung: Secondary | ICD-10-CM

## 2022-04-23 DIAGNOSIS — K219 Gastro-esophageal reflux disease without esophagitis: Secondary | ICD-10-CM | POA: Diagnosis present

## 2022-04-23 DIAGNOSIS — Z808 Family history of malignant neoplasm of other organs or systems: Secondary | ICD-10-CM

## 2022-04-23 DIAGNOSIS — R627 Adult failure to thrive: Secondary | ICD-10-CM | POA: Diagnosis present

## 2022-04-23 DIAGNOSIS — Z7951 Long term (current) use of inhaled steroids: Secondary | ICD-10-CM

## 2022-04-23 DIAGNOSIS — F32A Depression, unspecified: Secondary | ICD-10-CM | POA: Diagnosis present

## 2022-04-23 DIAGNOSIS — J101 Influenza due to other identified influenza virus with other respiratory manifestations: Secondary | ICD-10-CM | POA: Diagnosis present

## 2022-04-23 DIAGNOSIS — Z8701 Personal history of pneumonia (recurrent): Secondary | ICD-10-CM | POA: Diagnosis not present

## 2022-04-23 DIAGNOSIS — Z86011 Personal history of benign neoplasm of the brain: Secondary | ICD-10-CM

## 2022-04-23 DIAGNOSIS — I252 Old myocardial infarction: Secondary | ICD-10-CM

## 2022-04-23 DIAGNOSIS — I10 Essential (primary) hypertension: Secondary | ICD-10-CM | POA: Diagnosis present

## 2022-04-23 DIAGNOSIS — C3491 Malignant neoplasm of unspecified part of right bronchus or lung: Secondary | ICD-10-CM | POA: Diagnosis present

## 2022-04-23 DIAGNOSIS — Z801 Family history of malignant neoplasm of trachea, bronchus and lung: Secondary | ICD-10-CM

## 2022-04-23 DIAGNOSIS — E785 Hyperlipidemia, unspecified: Secondary | ICD-10-CM | POA: Diagnosis present

## 2022-04-23 DIAGNOSIS — Z87891 Personal history of nicotine dependence: Secondary | ICD-10-CM | POA: Diagnosis not present

## 2022-04-23 DIAGNOSIS — Z1152 Encounter for screening for COVID-19: Secondary | ICD-10-CM | POA: Diagnosis not present

## 2022-04-23 DIAGNOSIS — J441 Chronic obstructive pulmonary disease with (acute) exacerbation: Secondary | ICD-10-CM | POA: Diagnosis present

## 2022-04-23 DIAGNOSIS — Z6822 Body mass index (BMI) 22.0-22.9, adult: Secondary | ICD-10-CM

## 2022-04-23 DIAGNOSIS — Z9981 Dependence on supplemental oxygen: Secondary | ICD-10-CM

## 2022-04-23 DIAGNOSIS — Z888 Allergy status to other drugs, medicaments and biological substances status: Secondary | ICD-10-CM

## 2022-04-23 DIAGNOSIS — Z8041 Family history of malignant neoplasm of ovary: Secondary | ICD-10-CM

## 2022-04-23 DIAGNOSIS — I2510 Atherosclerotic heart disease of native coronary artery without angina pectoris: Secondary | ICD-10-CM | POA: Diagnosis present

## 2022-04-23 DIAGNOSIS — Z9049 Acquired absence of other specified parts of digestive tract: Secondary | ICD-10-CM | POA: Diagnosis not present

## 2022-04-23 DIAGNOSIS — J9601 Acute respiratory failure with hypoxia: Secondary | ICD-10-CM | POA: Diagnosis not present

## 2022-04-23 DIAGNOSIS — Z79899 Other long term (current) drug therapy: Secondary | ICD-10-CM

## 2022-04-23 LAB — CBC WITH DIFFERENTIAL/PLATELET
Abs Immature Granulocytes: 0.02 10*3/uL (ref 0.00–0.07)
Basophils Absolute: 0 10*3/uL (ref 0.0–0.1)
Basophils Relative: 0 %
Eosinophils Absolute: 0.1 10*3/uL (ref 0.0–0.5)
Eosinophils Relative: 1 %
HCT: 33.9 % — ABNORMAL LOW (ref 36.0–46.0)
Hemoglobin: 11.1 g/dL — ABNORMAL LOW (ref 12.0–15.0)
Immature Granulocytes: 0 %
Lymphocytes Relative: 9 %
Lymphs Abs: 0.6 10*3/uL — ABNORMAL LOW (ref 0.7–4.0)
MCH: 27.3 pg (ref 26.0–34.0)
MCHC: 32.7 g/dL (ref 30.0–36.0)
MCV: 83.3 fL (ref 80.0–100.0)
Monocytes Absolute: 0.6 10*3/uL (ref 0.1–1.0)
Monocytes Relative: 8 %
Neutro Abs: 5.8 10*3/uL (ref 1.7–7.7)
Neutrophils Relative %: 82 %
Platelets: 205 10*3/uL (ref 150–400)
RBC: 4.07 MIL/uL (ref 3.87–5.11)
RDW: 16.2 % — ABNORMAL HIGH (ref 11.5–15.5)
WBC: 7.1 10*3/uL (ref 4.0–10.5)
nRBC: 0 % (ref 0.0–0.2)

## 2022-04-23 LAB — BASIC METABOLIC PANEL
Anion gap: 10 (ref 5–15)
BUN: 17 mg/dL (ref 8–23)
CO2: 29 mmol/L (ref 22–32)
Calcium: 8.2 mg/dL — ABNORMAL LOW (ref 8.9–10.3)
Chloride: 101 mmol/L (ref 98–111)
Creatinine, Ser: 0.5 mg/dL (ref 0.44–1.00)
GFR, Estimated: >60 mL/min (ref >60)
Glucose, Bld: 133 mg/dL — ABNORMAL HIGH (ref 70–99)
Potassium: 3.6 mmol/L (ref 3.5–5.1)
Sodium: 140 mmol/L (ref 135–145)

## 2022-04-23 LAB — RESP PANEL BY RT-PCR (RSV, FLU A&B, COVID)  RVPGX2
Influenza A by PCR: NEGATIVE
Influenza B by PCR: POSITIVE — AB
Resp Syncytial Virus by PCR: NEGATIVE
SARS Coronavirus 2 by RT PCR: NEGATIVE

## 2022-04-23 LAB — TROPONIN I (HIGH SENSITIVITY)
Troponin I (High Sensitivity): 4 ng/L (ref <18)
Troponin I (High Sensitivity): 6 ng/L (ref <18)

## 2022-04-23 LAB — BLOOD GAS, VENOUS
Acid-Base Excess: 8 mmol/L — ABNORMAL HIGH (ref 0.0–2.0)
Bicarbonate: 34.7 mmol/L — ABNORMAL HIGH (ref 20.0–28.0)
Drawn by: 53361
O2 Saturation: 97.3 %
Patient temperature: 37.2
pCO2, Ven: 56 mmHg (ref 44–60)
pH, Ven: 7.4 (ref 7.25–7.43)
pO2, Ven: 77 mmHg — ABNORMAL HIGH (ref 32–45)

## 2022-04-23 LAB — BRAIN NATRIURETIC PEPTIDE: B Natriuretic Peptide: 57 pg/mL (ref 0.0–100.0)

## 2022-04-23 MED ORDER — IPRATROPIUM-ALBUTEROL 0.5-2.5 (3) MG/3ML IN SOLN
3.0000 mL | Freq: Once | RESPIRATORY_TRACT | Status: AC
  Administered 2022-04-23: 3 mL via RESPIRATORY_TRACT
  Filled 2022-04-23: qty 3

## 2022-04-23 MED ORDER — METHYLPREDNISOLONE SODIUM SUCC 125 MG IJ SOLR
125.0000 mg | Freq: Once | INTRAMUSCULAR | Status: AC
  Administered 2022-04-23: 125 mg via INTRAVENOUS
  Filled 2022-04-23: qty 2

## 2022-04-23 MED ORDER — METHYLPREDNISOLONE SODIUM SUCC 125 MG IJ SOLR
125.0000 mg | Freq: Two times a day (BID) | INTRAMUSCULAR | Status: DC
  Administered 2022-04-23: 125 mg via INTRAVENOUS
  Filled 2022-04-23: qty 2

## 2022-04-23 MED ORDER — ACETAMINOPHEN 325 MG PO TABS
650.0000 mg | ORAL_TABLET | Freq: Four times a day (QID) | ORAL | Status: DC | PRN
  Administered 2022-04-23 – 2022-04-25 (×3): 650 mg via ORAL
  Filled 2022-04-23 (×3): qty 2

## 2022-04-23 MED ORDER — ALBUTEROL SULFATE (2.5 MG/3ML) 0.083% IN NEBU
2.5000 mg | INHALATION_SOLUTION | RESPIRATORY_TRACT | Status: DC | PRN
  Administered 2022-04-24 (×2): 2.5 mg via RESPIRATORY_TRACT
  Filled 2022-04-23 (×2): qty 3

## 2022-04-23 MED ORDER — UMECLIDINIUM BROMIDE 62.5 MCG/ACT IN AEPB
1.0000 | INHALATION_SPRAY | Freq: Every day | RESPIRATORY_TRACT | Status: DC
  Administered 2022-04-23 – 2022-04-27 (×5): 1 via RESPIRATORY_TRACT
  Filled 2022-04-23 (×2): qty 7

## 2022-04-23 MED ORDER — IPRATROPIUM-ALBUTEROL 0.5-2.5 (3) MG/3ML IN SOLN
3.0000 mL | Freq: Four times a day (QID) | RESPIRATORY_TRACT | Status: DC
  Administered 2022-04-23 – 2022-04-24 (×4): 3 mL via RESPIRATORY_TRACT
  Filled 2022-04-23 (×4): qty 3

## 2022-04-23 MED ORDER — ACETAMINOPHEN 650 MG RE SUPP: 650.0000 mg | Freq: Four times a day (QID) | RECTAL | Status: DC | PRN

## 2022-04-23 MED ORDER — CHLORHEXIDINE GLUCONATE CLOTH 2 % EX PADS
6.0000 | MEDICATED_PAD | Freq: Every day | CUTANEOUS | Status: DC
  Administered 2022-04-23 – 2022-04-25 (×3): 6 via TOPICAL

## 2022-04-23 MED ORDER — ONDANSETRON HCL 4 MG PO TABS: 4.0000 mg | ORAL_TABLET | Freq: Four times a day (QID) | ORAL | Status: DC | PRN

## 2022-04-23 MED ORDER — MAGNESIUM SULFATE 2 GM/50ML IV SOLN
2.0000 g | Freq: Once | INTRAVENOUS | Status: AC
  Administered 2022-04-23: 2 g via INTRAVENOUS
  Filled 2022-04-23: qty 50

## 2022-04-23 MED ORDER — OXYCODONE HCL 5 MG PO TABS
5.0000 mg | ORAL_TABLET | ORAL | Status: DC | PRN
  Administered 2022-04-23 – 2022-04-26 (×6): 5 mg via ORAL
  Filled 2022-04-23 (×6): qty 1

## 2022-04-23 MED ORDER — PANTOPRAZOLE SODIUM 40 MG PO TBEC
40.0000 mg | DELAYED_RELEASE_TABLET | Freq: Every day | ORAL | Status: DC
  Administered 2022-04-23 – 2022-04-27 (×5): 40 mg via ORAL
  Filled 2022-04-23 (×5): qty 1

## 2022-04-23 MED ORDER — ATORVASTATIN CALCIUM 10 MG PO TABS
10.0000 mg | ORAL_TABLET | Freq: Every day | ORAL | Status: DC
  Administered 2022-04-23 – 2022-04-27 (×5): 10 mg via ORAL
  Filled 2022-04-23 (×5): qty 1

## 2022-04-23 MED ORDER — POTASSIUM CHLORIDE CRYS ER 20 MEQ PO TBCR
40.0000 meq | EXTENDED_RELEASE_TABLET | Freq: Once | ORAL | Status: AC
  Administered 2022-04-23: 40 meq via ORAL
  Filled 2022-04-23: qty 2

## 2022-04-23 MED ORDER — METHYLPREDNISOLONE SODIUM SUCC 40 MG IJ SOLR
40.0000 mg | Freq: Two times a day (BID) | INTRAMUSCULAR | Status: DC
  Administered 2022-04-23 – 2022-04-27 (×8): 40 mg via INTRAVENOUS
  Filled 2022-04-23 (×8): qty 1

## 2022-04-23 MED ORDER — OSELTAMIVIR PHOSPHATE 75 MG PO CAPS
75.0000 mg | ORAL_CAPSULE | Freq: Two times a day (BID) | ORAL | Status: DC
  Administered 2022-04-23 (×2): 75 mg via ORAL
  Filled 2022-04-23 (×5): qty 1

## 2022-04-23 MED ORDER — FLUTICASONE FUROATE-VILANTEROL 100-25 MCG/ACT IN AEPB
1.0000 | INHALATION_SPRAY | Freq: Every day | RESPIRATORY_TRACT | Status: DC
  Administered 2022-04-23 – 2022-04-27 (×5): 1 via RESPIRATORY_TRACT
  Filled 2022-04-23 (×2): qty 28

## 2022-04-23 MED ORDER — ENSURE ENLIVE PO LIQD
237.0000 mL | Freq: Three times a day (TID) | ORAL | Status: DC
  Administered 2022-04-23 – 2022-04-26 (×7): 237 mL via ORAL
  Filled 2022-04-23 (×8): qty 237

## 2022-04-23 MED ORDER — PANTOPRAZOLE SODIUM 20 MG PO TBEC
20.0000 mg | DELAYED_RELEASE_TABLET | Freq: Every day | ORAL | Status: DC
  Filled 2022-04-23 (×3): qty 1

## 2022-04-23 MED ORDER — DULOXETINE HCL 60 MG PO CPEP
60.0000 mg | ORAL_CAPSULE | Freq: Every day | ORAL | Status: DC
  Administered 2022-04-23 – 2022-04-27 (×5): 60 mg via ORAL
  Filled 2022-04-23: qty 2
  Filled 2022-04-23 (×5): qty 1

## 2022-04-23 MED ORDER — ONDANSETRON HCL 4 MG/2ML IJ SOLN: 4.0000 mg | Freq: Four times a day (QID) | INTRAMUSCULAR | Status: DC | PRN

## 2022-04-23 MED ORDER — MORPHINE SULFATE (PF) 2 MG/ML IV SOLN
2.0000 mg | INTRAVENOUS | Status: DC | PRN
  Administered 2022-04-23 – 2022-04-24 (×2): 2 mg via INTRAVENOUS
  Filled 2022-04-23 (×2): qty 1

## 2022-04-23 MED ORDER — PREDNISONE 20 MG PO TABS: 40.0000 mg | ORAL_TABLET | Freq: Every day | ORAL | Status: DC

## 2022-04-23 MED ORDER — HEPARIN SODIUM (PORCINE) 5000 UNIT/ML IJ SOLN
5000.0000 [IU] | Freq: Three times a day (TID) | INTRAMUSCULAR | Status: DC
  Administered 2022-04-23 – 2022-04-27 (×13): 5000 [IU] via SUBCUTANEOUS
  Filled 2022-04-23 (×13): qty 1

## 2022-04-23 NOTE — Assessment & Plan Note (Signed)
-   There is a diagnosis of hypertension, but no antihypertensives on home med list - Blood pressure stable 133/69-145/81 in the ED - Continue to monitor

## 2022-04-23 NOTE — Progress Notes (Signed)
Patient seen and evaluated, chart reviewed, please see EMR for updated orders. Please see full H&P dictated by admitting physician Dr. Orlin Hilding for same date of service.   Brief Summary:- 72 y.o. female with medical history significant of PD, depression, GERD, hypertension, lung cancer, chronic respiratory failure requiring 4 L nasal cannula admitted on 04/23/2022 with acute COPD exacerbation secondary to influenza B  A/p 1) acute COPD exacerbation secondary to influenza B--- POA -patient remains quite symptomatic with cough wheezing dyspnea and worsening hypoxia -Continue mucolytics and bronchodilators -IV Solu-Medrol as ordered  2) influenza B--POA patient is vaccinated against the flu -Please see #1 above -Tamiflu as ordered  3) acute on chronic hypoxic respiratory failure--POA, due to #1 and #2 above -Initially required BiPAP -At baseline patient requires 4 L of oxygen via nasal cannula -Currently on 6 L -Continue to wean down -Further management as above #1 #2 -Echo from 04/18/22 remarkable for EF of 65 to 70%, -BiPAP as needed and nightly  4)GERD-stable, continue Protonix especially while on steroids  5)Rt Lung Cancer--she was scheduled to get chemo treatments today 04/23/2021 -Chemo currently on hold due to above  6)HLD-continue atorvastatin  7)Depression/anxiety--continue Cymbalta  8)FTT--adult failure to thrive, outpatient dietitian consult has been rescheduled for 05/20/2022 oncology clinic -Encourage supplements and multivitamin  - Physical Exam Gen:- Awake Alert, dyspnea with minimal exertion  HEENT:- [place].AT, No sclera icterus, left eye ptosis is not new Nose-6L/min Neck-Supple Neck,No JVD,.  Lungs-diminished breath sounds, few scattered wheezes bilaterally  CV- S1, S2 normal, RRR, right-sided Port-A-Cath Abd-  +ve B.Sounds, Abd Soft, No tenderness,    Extremity/Skin:- No  edema,   good pedal pulses  Psych-affect is appropriate, oriented x3 Neuro-no new focal  deficits, no tremors  - Total care time over 56 minutes  Roxan Hockey, MD

## 2022-04-23 NOTE — ED Notes (Signed)
Pt received dinner tray. Able to feed self

## 2022-04-23 NOTE — ED Notes (Signed)
EDP and respiratory at bedside

## 2022-04-23 NOTE — Assessment & Plan Note (Signed)
-   Secondary to COPD exacerbation - Patient requiring high flow - She wears 4 L nasal cannula at home, but dropped down to the mid 80s on her baseline O2 - Checking COVID, flu, RSV - Chest x-ray shows COPD, but no focal pneumonia - Troponin 4, 6 - BNP 57 - EKG shows a heart rate 107, sinus tachycardia, QTc 443 - Continue to monitor

## 2022-04-23 NOTE — Progress Notes (Signed)
Scheduled to see patient during infusion. Chart reviewed. Patient is currently in ED. Appointment rescheduled for 2/6 during infusion.

## 2022-04-23 NOTE — H&P (Signed)
History and Physical    PatientConcepcion Zuniga SEG:[id number] DOB: Feb 20, 1951 DOA: 04/23/2022 DOS: the patient was seen and examined on 04/23/2022 PCP: [name], FNP  Patient coming from: Home  Chief Complaint:  Chief Complaint  Patient presents with   Shortness of Breath   HPI: Courtney Zuniga is a 72 y.o. female with medical history significant of PD, depression, GERD, hypertension, lung cancer, chronic respiratory failure requiring 4 L nasal cannula, and more presents the ED with a chief complaint of dyspnea.  This started at 3 PM on January 9.  It was progressively worse.  She had associated cough without production.  She had chest pain that was substernal and felt like a tightness.  Her inhaler improved her symptoms but did not resolve them, and the severity of the symptoms quickly returned.  She reports at 1 point she did feel near syncopal, but did not pass out.  Patient denies any fever or sick contacts.  She denies any other symptoms including no nausea, vomiting, constipation, dysuria, palpitations.  Patient has no other complaints at this time.  Patient quit smoking 6 months ago.  She does not drink alcohol, does not use illicit drugs.  She is vaccinated for COVID and flu.  Patient is full code. Review of Systems: As mentioned in the history of present illness. All other systems reviewed and are negative. Past Medical History:  Diagnosis Date   Anemia    as a young woman   Arthritis    osteoartritis   Asthma    Brain tumor (benign) ([place]) 2005 [facility]   Benign   Chronic headaches    Chronic hip pain    Chronic pain    COPD (chronic obstructive pulmonary disease) (HCC)    Coronary artery disease    Depression    Depression 05/15/2016   Encounter for antineoplastic chemotherapy 01/10/2016   GERD (gastroesophageal reflux disease)    Hypertension    Lung cancer ([place]) dx'd 01/2016   currently on chemo and radiation    NSTEMI (non-ST elevated myocardial infarction) ([place])  yrs ago   On home O2    qhs 2 liters at hs and prn   Pneumonia last time 2 yrs ago   Shortness of breath dyspnea    with activity   Past Surgical History:  Procedure Laterality Date   CHOLECYSTECTOMY     COLONOSCOPY  2015   Results requested from [facility]   COLONOSCOPY     ESOPHAGOGASTRODUODENOSCOPY N/A 08/14/2015   Procedure: ESOPHAGOGASTRODUODENOSCOPY (EGD);  Surgeon: Daneil Dolin, MD;  [facility];  Service: Endoscopy;  Laterality: N/A;  215    ESOPHAGOGASTRODUODENOSCOPY (EGD) WITH PROPOFOL N/A 09/13/2015   Procedure: ESOPHAGOGASTRODUODENOSCOPY (EGD) WITH PROPOFOL;  Surgeon: Milus Banister, MD;  Location: [facility] ENDOSCOPY;  Service: Endoscopy;  Laterality: N/A;   EUS N/A 03/12/2017   Procedure: UPPER ENDOSCOPIC ULTRASOUND (EUS) RADIAL;  Surgeon: Milus Banister, MD;  Location: [facility] ENDOSCOPY;  Service: Endoscopy;  Laterality: N/A;   IR IMAGING GUIDED PORT INSERTION  01/20/2022   TUMOR REMOVAL  2005   Benign   UPPER ESOPHAGEAL ENDOSCOPIC ULTRASOUND (EUS)  09/13/2015   Procedure: UPPER ESOPHAGEAL ENDOSCOPIC ULTRASOUND (EUS);  Surgeon: Milus Banister, MD;  Location: [facility];  Service: Endoscopy;;   VIDEO BRONCHOSCOPY WITH ENDOBRONCHIAL NAVIGATION N/A 12/31/2015   Procedure: VIDEO BRONCHOSCOPY WITH ENDOBRONCHIAL NAVIGATION;  Surgeon: Melrose Nakayama, MD;  Location: [place];  Service: Thoracic;  Laterality: N/A;   [place]  ULTRASOUND N/A 11/08/2015   Procedure: VIDEO BRONCHOSCOPY WITH ENDOBRONCHIAL ULTRASOUND;  Surgeon: Ivin Poot, MD;  Location: [facility] OR;  Service: Thoracic;  Laterality: N/A;   Social History:  reports that she quit smoking about 3 years ago. Her smoking use included cigarettes. She has a 38.00 pack-year smoking history. She has never used smokeless tobacco. She reports that she does not drink alcohol and does not use drugs.  Allergies  Allergen Reactions   Desyrel [Trazodone]     "Made me faint"   No Known  Allergies     Family History  Problem Relation Age of Onset   Ovarian cancer Mother    Lung cancer Father    Lung cancer Brother    Lung cancer Brother    Prostate cancer Brother    Brain cancer Sister        Half sister    Prior to Admission medications   Medication Sig Start Date End Date Taking? Authorizing Provider  acetaminophen (TYLENOL) 325 MG tablet Take 2 tablets (650 mg total) by mouth every 6 (six) hours as needed for mild pain, fever or headache. 05/08/18   Denton Brick, Courage, MD  albuterol (PROVENTIL HFA;VENTOLIN HFA) 108 (90 BASE) MCG/ACT inhaler Inhale 2 puffs into the lungs every 4 (four) hours as needed for shortness of breath.     [provider]  atorvastatin (LIPITOR) 10 MG tablet Take 10 mg by mouth daily.    [provider]  BREO ELLIPTA 100-25 MCG/ACT AEPB 1 puff daily. 12/13/21   [provider]  Cholecalciferol (VITAMIN D3) 125 MCG (5000 UT) CAPS Take 1 capsule by mouth daily. 02/17/18   [provider]  cyclobenzaprine (FLEXERIL) 10 MG tablet Take 1 tablet (10 mg total) by mouth 2 (two) times daily. *MAy take one additional tablet as needed for muscle spasms Patient taking differently: Take 10 mg by mouth 2 (two) times daily. *May take one additional tablet as needed for muscle spasms 05/08/18   Roxan Hockey, MD  diclofenac sodium (VOLTAREN) 1 % GEL Apply topically 4 (four) times daily as needed (massge gel into affected area(s) as needed for pain).  08/11/17   [provider]  DULoxetine (CYMBALTA) 60 MG capsule Cymbalta 60 mg capsule,delayed release  Take 1 capsule every day by oral route.  stop 30 mg capsules    [provider]  ENSURE (ENSURE) Take 237 mLs by mouth 3 (three) times daily between meals.    [provider]  HYDROcodone bit-homatropine (HYCODAN) 5-1.5 MG/5ML syrup Take 5 mLs by mouth every 6 (six) hours as needed for cough. 02/26/22   Curt Bears, MD  Ipratropium-Albuterol  (COMBIVENT RESPIMAT) 20-100 MCG/ACT AERS respimat INHALE 2 PUFFS BY MOUTH FOUR TIMES DAILY AS NEEDED FOR WHEEZING 01/18/21   [provider]  ipratropium-albuterol (DUONEB) 0.5-2.5 (3) MG/3ML SOLN Take 3 mLs by nebulization 3 (three) times daily. 05/08/18   Roxan Hockey, MD  lidocaine-prilocaine (EMLA) cream Apply to the Port-A-Cath site 30-60 minutes before treatment 01/01/22   Curt Bears, MD  megestrol (MEGACE ES) 625 MG/5ML suspension Take 5 mLs (625 mg total) by mouth daily. 01/29/22   Heilingoetter, Cassandra L, PA-C  metoprolol tartrate (LOPRESSOR) 25 MG tablet Take 1 tablet (25 mg total) by mouth 2 (two) times daily. 08/09/20 09/08/20  Deatra James, MD  oxyCODONE-acetaminophen (PERCOCET/ROXICET) 5-325 MG tablet Take 1 tablet by mouth every 8 (eight) hours as needed for moderate pain. 05/08/18   Roxan Hockey, MD  pantoprazole (PROTONIX) 20 MG tablet  Take 20 mg by mouth daily. Not sure of dosage    [provider]  predniSONE (DELTASONE) 20 MG tablet Take 1 tablet (20 mg total) by mouth daily with breakfast. 11/25/21   Azucena Cecil, PA-C  prochlorperazine (COMPAZINE) 10 MG tablet Take 1 tablet (10 mg total) by mouth every 6 (six) hours as needed for nausea or vomiting. 08/02/21   Curt Bears, MD  topiramate (TOPAMAX) 25 MG tablet Take 1 tablet by mouth daily at bedtime X 1 week; then increase to 25mg  BID. Patient taking differently: Take 25 mg by mouth 2 (two) times daily. 06/18/18   Barton Dubois, MD  TRELEGY ELLIPTA 100-62.5-25 MCG/ACT AEPB Inhale 1 puff into the lungs daily. 04/08/21   [provider]  Vitamin D, Ergocalciferol, (DRISDOL) 1.25 MG (50000 UNIT) CAPS capsule Take 50,000 Units by mouth once a week. 02/18/21   [provider]  HYDROcodone bit-homatropine (HYCODAN) 5-1.5 MG/5ML syrup Take 5 mLs by mouth every 6 (six) hours as needed for cough. 11/10/21   Curt Bears, MD    Physical Exam: Vitals:   04/23/22 0248  04/23/22 0300 04/23/22 0330 04/23/22 0400  BP:  (!) 145/81 138/78 133/69  Pulse:  (!) 113 (!) 110 (!) 106  Resp:  (!) 32 (!) 43 (!) 31  SpO2: 99% 96% 97% 93%  Weight:      Height:       1.  General: Patient lying supine in bed,  no acute distress   2. Psychiatric: Alert and oriented x 3, mood and behavior normal for situation, pleasant and cooperative with exam   3. Neurologic: Speech and language are normal, face is symmetric, moves all 4 extremities voluntarily, at baseline without acute deficits on limited exam   4. HEENMT:  Head is atraumatic, normocephalic, pupils reactive to light, neck is supple, trachea is midline, mucous membranes are moist   5. Respiratory : Increased work of breathing, speaking in 3 word phrases, wheezing on exam with diminished breath sounds at the bases, no rhonchi, no rales, no cyanosis.   6. Cardiovascular : Heart rate tachycardic, rhythm is regular, no murmurs, rubs or gallops, no peripheral edema, peripheral pulses palpated   7. Gastrointestinal:  Abdomen is soft, nondistended, nontender to palpation bowel sounds active, no masses or organomegaly palpated   8. Skin:  Skin is warm, dry and intact without rashes, acute lesions, or ulcers on limited exam   9.Musculoskeletal:  No acute deformities or trauma, no asymmetry in tone, no peripheral edema, peripheral pulses palpated, no tenderness to palpation in the extremities  Data Reviewed: In the ED Afebrile heart rate 106-1 13, respiratory rate 18-43, blood pressure 133/69-145/81, maintaining oxygen sats on high flow No leukocytosis with white blood cell count 7.1, hemoglobin 11.1, platelets 205 History is unremarkable Chest x-ray shows no focal pneumonia.  COPD EKG shows a heart rate of 107, sinus tach, QTc 443 Patient was given 2 DuoNebs, mag sulfate, Solu-Medrol Admission was requested for COPD exacerbation  Assessment and Plan: * COPD exacerbation ([place]) - With wheezing, increased work  of breathing - Chest x-ray shows COPD - Patient was given DuoNeb x 2, mag sulfate, Solu-Medrol in the ED - Checking COVID, flu, RSV as possible triggers - Continue DuoNeb, Breo, and Incruse - Continue Solu-Medrol - At this time patient is not requiring BiPAP, VBG pending - Continue to monitor  Acute respiratory failure with hypoxia ([place]) - Secondary to COPD exacerbation - Patient requiring high flow - She wears 4 L nasal  cannula at home, but dropped down to the mid 80s on her baseline O2 - Checking COVID, flu, RSV - Chest x-ray shows COPD, but no focal pneumonia - Troponin 4, 6 - BNP 57 - EKG shows a heart rate 107, sinus tachycardia, QTc 443 - Continue to monitor  Adenocarcinoma of right lung, stage 3 (HCC) - Currently undergoing chemo - Due for chemo today - Schedule is once monthly  HTN (hypertension) - There is a diagnosis of hypertension, but no antihypertensives on home med list - Blood pressure stable 133/69-145/81 in the ED - Continue to monitor      Advance Care Planning:   Code Status: Full Code  Consults: None at this time  Family Communication: No bedside  Severity of Illness: The appropriate patient status for this patient is INPATIENT. Inpatient status is judged to be reasonable and necessary in order to provide the required intensity of service to ensure the patient's safety. The patient's presenting symptoms, physical exam findings, and initial radiographic and laboratory data in the context of their chronic comorbidities is felt to place them at high risk for further clinical deterioration. Furthermore, it is not anticipated that the patient will be medically stable for discharge from the hospital within 2 midnights of admission.   * I certify that at the point of admission it is my clinical judgment that the patient will require inpatient hospital care spanning beyond 2 midnights from the point of admission due to high intensity of service, high risk for  further deterioration and high frequency of surveillance required.*  Author: Rolla Plate, DO 04/23/2022 5:58 AM  For on call review [email or web address].

## 2022-04-23 NOTE — ED Triage Notes (Signed)
Pt presents with SOB that started worsening over the last few days. Pt has COPD and lung cancer. Pt is on 2L chronically. Pt was in 57s when EMS got on scene.

## 2022-04-23 NOTE — TOC Progression Note (Signed)
  Transition of Care [facility]) Screening Note   Patient Details  Name: Courtney Zuniga Date of Birth: 19-Jun-1950   Transition of Care [facility]) CM/SW Contact:    Boneta Lucks, RN Phone Number: 04/23/2022, 1:26 PM  IN ED for workup- on 2L at baseline.  Transition of Care Department [facility]) has reviewed patient and no TOC needs have been identified at this time. We will continue to monitor patient advancement through interdisciplinary progression rounds. If new patient transition needs arise, please place a TOC consult.     Barriers to Discharge: Continued Medical Work up  Expected Discharge Plan and Services     Social Determinants of Health (SDOH) Interventions SDOH Screenings   Food Insecurity: No Food Insecurity (05/05/2018)  Transportation Needs: No Transportation Needs (05/05/2018)  Financial Resource Strain: Low Risk  (05/05/2018)  Physical Activity: Insufficiently Active (05/05/2018)  Social Connections: Somewhat Isolated (05/05/2018)  Stress: No Stress Concern Present (05/05/2018)  Tobacco Use: Medium Risk (04/23/2022)    Readmission Risk Interventions    08/09/2020   12:41 PM  Readmission Risk Prevention Plan  Medication Screening Complete  Transportation Screening Complete

## 2022-04-23 NOTE — Assessment & Plan Note (Signed)
-   Currently undergoing chemo - Due for chemo today - Schedule is once monthly

## 2022-04-23 NOTE — Assessment & Plan Note (Signed)
-   With wheezing, increased work of breathing - Chest x-ray shows COPD - Patient was given DuoNeb x 2, mag sulfate, Solu-Medrol in the ED - Checking COVID, flu, RSV as possible triggers - Continue DuoNeb, Breo, and Incruse - Continue Solu-Medrol - At this time patient is not requiring BiPAP, VBG pending - Continue to monitor

## 2022-04-23 NOTE — ED Provider Notes (Addendum)
[facility] EMERGENCY DEPARTMENT Provider Note   CSN: [id number] Arrival date & time: 04/23/22  0226     History  Chief Complaint  Patient presents with   Shortness of Breath    Courtney Zuniga is a 72 y.o. female.  Patient is a 72 year old female with past medical history of COPD and lung cancer currently undergoing chemotherapy, hypertension.  Patient presenting today with complaints of shortness of breath.  This has been worsening over the past several days, then significantly worsened this evening.  Patient is on home oxygen at 2 L nasal cannula with saturations in the mid 80s when EMS arrived.  She was transported here in moderate respiratory distress.  She denies fevers, chills, or productive cough.  She denies chest pain.  The history is provided by the patient.       Home Medications Prior to Admission medications   Medication Sig Start Date End Date Taking? Authorizing Provider  acetaminophen (TYLENOL) 325 MG tablet Take 2 tablets (650 mg total) by mouth every 6 (six) hours as needed for mild pain, fever or headache. 05/08/18   Denton Brick, Courage, MD  albuterol (PROVENTIL HFA;VENTOLIN HFA) 108 (90 BASE) MCG/ACT inhaler Inhale 2 puffs into the lungs every 4 (four) hours as needed for shortness of breath.     [provider]  atorvastatin (LIPITOR) 10 MG tablet Take 10 mg by mouth daily.    [provider]  BREO ELLIPTA 100-25 MCG/ACT AEPB 1 puff daily. 12/13/21   [provider]  Cholecalciferol (VITAMIN D3) 125 MCG (5000 UT) CAPS Take 1 capsule by mouth daily. 02/17/18   [provider]  cyclobenzaprine (FLEXERIL) 10 MG tablet Take 1 tablet (10 mg total) by mouth 2 (two) times daily. *MAy take one additional tablet as needed for muscle spasms Patient taking differently: Take 10 mg by mouth 2 (two) times daily. *May take one additional tablet as needed for muscle spasms 05/08/18   Roxan Hockey, MD  diclofenac sodium (VOLTAREN) 1 % GEL Apply  topically 4 (four) times daily as needed (massge gel into affected area(s) as needed for pain).  08/11/17   [provider]  DULoxetine (CYMBALTA) 60 MG capsule Cymbalta 60 mg capsule,delayed release  Take 1 capsule every day by oral route.  stop 30 mg capsules    [provider]  ENSURE (ENSURE) Take 237 mLs by mouth 3 (three) times daily between meals.    [provider]  HYDROcodone bit-homatropine (HYCODAN) 5-1.5 MG/5ML syrup Take 5 mLs by mouth every 6 (six) hours as needed for cough. 02/26/22   Curt Bears, MD  Ipratropium-Albuterol (COMBIVENT RESPIMAT) 20-100 MCG/ACT AERS respimat INHALE 2 PUFFS BY MOUTH FOUR TIMES DAILY AS NEEDED FOR WHEEZING 01/18/21   [provider]  ipratropium-albuterol (DUONEB) 0.5-2.5 (3) MG/3ML SOLN Take 3 mLs by nebulization 3 (three) times daily. 05/08/18   Roxan Hockey, MD  lidocaine-prilocaine (EMLA) cream Apply to the Port-A-Cath site 30-60 minutes before treatment 01/01/22   Curt Bears, MD  megestrol (MEGACE ES) 625 MG/5ML suspension Take 5 mLs (625 mg total) by mouth daily. 01/29/22   Heilingoetter, Cassandra L, PA-C  metoprolol tartrate (LOPRESSOR) 25 MG tablet Take 1 tablet (25 mg total) by mouth 2 (two) times daily. 08/09/20 09/08/20  Deatra James, MD  oxyCODONE-acetaminophen (PERCOCET/ROXICET) 5-325 MG tablet Take 1 tablet by mouth every 8 (eight) hours as needed for moderate pain. 05/08/18   Roxan Hockey, MD  pantoprazole (PROTONIX) 20 MG tablet Take 20 mg by mouth daily. Not  sure of dosage    [provider]  predniSONE (DELTASONE) 20 MG tablet Take 1 tablet (20 mg total) by mouth daily with breakfast. 11/25/21   Azucena Cecil, PA-C  prochlorperazine (COMPAZINE) 10 MG tablet Take 1 tablet (10 mg total) by mouth every 6 (six) hours as needed for nausea or vomiting. 08/02/21   Curt Bears, MD  topiramate (TOPAMAX) 25 MG tablet Take 1 tablet by mouth daily at bedtime X 1 week; then  increase to 25mg  BID. Patient taking differently: Take 25 mg by mouth 2 (two) times daily. 06/18/18   Barton Dubois, MD  TRELEGY ELLIPTA 100-62.5-25 MCG/ACT AEPB Inhale 1 puff into the lungs daily. 04/08/21   [provider]  Vitamin D, Ergocalciferol, (DRISDOL) 1.25 MG (50000 UNIT) CAPS capsule Take 50,000 Units by mouth once a week. 02/18/21   [provider]  HYDROcodone bit-homatropine (HYCODAN) 5-1.5 MG/5ML syrup Take 5 mLs by mouth every 6 (six) hours as needed for cough. 11/10/21   Curt Bears, MD      Allergies    Desyrel [trazodone] and No known allergies    Review of Systems   Review of Systems  All other systems reviewed and are negative.   Physical Exam Updated Vital Signs BP (!) 145/81   Pulse (!) 113   Resp (!) 32   Ht 5\' 6"  (1.676 m)   Wt 64 kg   SpO2 96%   BMI 22.76 kg/m  Physical Exam Vitals and nursing note reviewed.  Constitutional:      General: She is not in acute distress.    Appearance: She is well-developed. She is not diaphoretic.  HENT:     Head: Normocephalic and atraumatic.  Cardiovascular:     Rate and Rhythm: Normal rate and regular rhythm.     Heart sounds: No murmur heard.    No friction rub. No gallop.  Pulmonary:     Effort: Tachypnea present. No respiratory distress.     Breath sounds: Examination of the right-middle field reveals rhonchi. Examination of the left-middle field reveals rhonchi. Rhonchi present. No wheezing.     Comments: Arrives here in moderate respiratory distress.  She is tachypneic and using accessory muscles to breathe.  There are rhonchorous breath sounds throughout. Abdominal:     General: Bowel sounds are normal. There is no distension.     Palpations: Abdomen is soft.     Tenderness: There is no abdominal tenderness.  Musculoskeletal:        General: Normal range of motion.     Cervical back: Normal range of motion and neck supple.  Skin:    General: Skin is warm and dry.  Neurological:      General: No focal deficit present.     Mental Status: She is alert and oriented to person, place, and time.     ED Results / Procedures / Treatments   Labs (all labs ordered are listed, but only abnormal results are displayed) Labs Reviewed  CBC WITH DIFFERENTIAL/PLATELET - Abnormal; Notable for the following components:      Result Value   Hemoglobin 11.1 (*)    HCT 33.9 (*)    RDW 16.2 (*)    Lymphs Abs 0.6 (*)    All other components within normal limits  BASIC METABOLIC PANEL  BRAIN NATRIURETIC PEPTIDE  TROPONIN I (HIGH SENSITIVITY)    EKG EKG Interpretation  Date/Time:  Wednesday April 23 2022 03:59:10 EST Ventricular Rate:  107 PR Interval:  178 QRS Duration:  80 QT Interval:  332 QTC Calculation: 443 R Axis:   -26 Text Interpretation: Sinus tachycardia Ventricular premature complex Borderline left axis deviation Borderline low voltage, extremity leads Baseline wander in lead(s) V3 Confirmed by Veryl Speak [phone]) on 04/23/2022 4:12:59 AM  Radiology DG Chest Port 1 View  Result Date: 04/23/2022 CLINICAL DATA:  Shortness of breath. History of COPD and lung cancer. EXAM: PORTABLE CHEST 1 VIEW COMPARISON:  CT chest 01/28/2022 and radiographs 12/14/2021 FINDINGS: Accessed right chest wall Port-A-Cath tip in the low SVC. Stable cardiomediastinal silhouette. Aortic atherosclerotic calcification. Hyperinflation. Chronic bronchitic change and interstitial coarsening. Bibasilar atelectasis/scarring biapical pleural-parenchymal scarring. No focal consolidation, pleural effusion, or pneumothorax. IMPRESSION: No focal pneumonia.  COPD. Electronically Signed   By: Placido Sou M.D.   On: 04/23/2022 03:09    Procedures Procedures    Medications Ordered in ED Medications  magnesium sulfate IVPB 2 g 50 mL (2 g Intravenous New Bag/Given 04/23/22 0251)  methylPREDNISolone sodium succinate (SOLU-MEDROL) 125 mg/2 mL injection 125 mg (125 mg Intravenous Given 04/23/22 0249)   ipratropium-albuterol (DUONEB) 0.5-2.5 (3) MG/3ML nebulizer solution 3 mL (3 mLs Nebulization Given 04/23/22 0247)  ipratropium-albuterol (DUONEB) 0.5-2.5 (3) MG/3ML nebulizer solution 3 mL (3 mLs Nebulization Given 04/23/22 0241)    ED Course/ Medical Decision Making/ A&P  Patient with history of COPD presenting with complaints of shortness of breath and respiratory distress.  Oxygen saturations in the mid 80s while on 2 L nasal cannula which is what she normally uses.  She was brought here by EMS.  She arrives here in respiratory distress.  She is tachypneic with accessory muscle use and poor air movement.  Patient evaluated immediately upon arrival to the ER.  Workup initiated with CBC, metabolic panel, BNP, and troponin.  These were all essentially unremarkable.  Chest x-ray obtained and shows no focal pneumonia, but is consistent with COPD.  Patient given IV Solu-Medrol along with magnesium.  She also received 2 DuoNebs with some improvement in her respiratory status.  Patient will require admission to the hospital.    CRITICAL CARE Performed by: Veryl Speak Total critical care time: 35 minutes Critical care time was exclusive of separately billable procedures and treating other patients. Critical care was necessary to treat or prevent imminent or life-threatening deterioration. Critical care was time spent personally by me on the following activities: development of treatment plan with patient and/or surrogate as well as nursing, discussions with consultants, evaluation of patient's response to treatment, examination of patient, obtaining history from patient or surrogate, ordering and performing treatments and interventions, ordering and review of laboratory studies, ordering and review of radiographic studies, pulse oximetry and re-evaluation of patient's condition.   Final Clinical Impression(s) / ED Diagnoses Final diagnoses:  None    Rx / DC Orders ED Discharge Orders      None         Veryl Speak, MD 04/23/22 [id number]    Veryl Speak, MD 04/23/22 [id number]

## 2022-04-23 NOTE — ED Notes (Signed)
Pt received breakfast tray.  Able to feed self.

## 2022-04-24 DIAGNOSIS — J441 Chronic obstructive pulmonary disease with (acute) exacerbation: Secondary | ICD-10-CM | POA: Diagnosis not present

## 2022-04-24 LAB — CBC WITH DIFFERENTIAL/PLATELET
Abs Immature Granulocytes: 0.04 10*3/uL (ref 0.00–0.07)
Basophils Absolute: 0 10*3/uL (ref 0.0–0.1)
Basophils Relative: 0 %
Eosinophils Absolute: 0 10*3/uL (ref 0.0–0.5)
Eosinophils Relative: 0 %
HCT: 35.1 % — ABNORMAL LOW (ref 36.0–46.0)
Hemoglobin: 11.1 g/dL — ABNORMAL LOW (ref 12.0–15.0)
Immature Granulocytes: 0 %
Lymphocytes Relative: 5 %
Lymphs Abs: 0.5 10*3/uL — ABNORMAL LOW (ref 0.7–4.0)
MCH: 26.8 pg (ref 26.0–34.0)
MCHC: 31.6 g/dL (ref 30.0–36.0)
MCV: 84.8 fL (ref 80.0–100.0)
Monocytes Absolute: 0.5 10*3/uL (ref 0.1–1.0)
Monocytes Relative: 5 %
Neutro Abs: 9.6 10*3/uL — ABNORMAL HIGH (ref 1.7–7.7)
Neutrophils Relative %: 90 %
Platelets: 221 10*3/uL (ref 150–400)
RBC: 4.14 MIL/uL (ref 3.87–5.11)
RDW: 16.3 % — ABNORMAL HIGH (ref 11.5–15.5)
WBC: 10.7 10*3/uL — ABNORMAL HIGH (ref 4.0–10.5)
nRBC: 0 % (ref 0.0–0.2)

## 2022-04-24 LAB — COMPREHENSIVE METABOLIC PANEL
ALT: 27 U/L (ref 0–44)
AST: 30 U/L (ref 15–41)
Albumin: 3.6 g/dL (ref 3.5–5.0)
Alkaline Phosphatase: 289 U/L — ABNORMAL HIGH (ref 38–126)
Anion gap: 5 (ref 5–15)
BUN: 20 mg/dL (ref 8–23)
CO2: 35 mmol/L — ABNORMAL HIGH (ref 22–32)
Calcium: 9 mg/dL (ref 8.9–10.3)
Chloride: 94 mmol/L — ABNORMAL LOW (ref 98–111)
Creatinine, Ser: 0.39 mg/dL — ABNORMAL LOW (ref 0.44–1.00)
GFR, Estimated: >60 mL/min (ref >60)
Glucose, Bld: 146 mg/dL — ABNORMAL HIGH (ref 70–99)
Potassium: 5 mmol/L (ref 3.5–5.1)
Sodium: 134 mmol/L — ABNORMAL LOW (ref 135–145)
Total Bilirubin: 0.2 mg/dL — ABNORMAL LOW (ref 0.3–1.2)
Total Protein: 7.1 g/dL (ref 6.5–8.1)

## 2022-04-24 LAB — MRSA NEXT GEN BY PCR, NASAL: MRSA by PCR Next Gen: NOT DETECTED

## 2022-04-24 LAB — MAGNESIUM: Magnesium: 1.9 mg/dL (ref 1.7–2.4)

## 2022-04-24 MED ORDER — TAMSULOSIN HCL 0.4 MG PO CAPS
0.4000 mg | ORAL_CAPSULE | Freq: Every day | ORAL | Status: DC
  Administered 2022-04-24 – 2022-04-26 (×3): 0.4 mg via ORAL
  Filled 2022-04-24 (×3): qty 1

## 2022-04-24 MED ORDER — MORPHINE SULFATE (PF) 2 MG/ML IV SOLN
2.0000 mg | INTRAVENOUS | Status: DC | PRN
  Administered 2022-04-24 – 2022-04-25 (×4): 2 mg via INTRAVENOUS
  Filled 2022-04-24 (×4): qty 1

## 2022-04-24 MED ORDER — IPRATROPIUM-ALBUTEROL 0.5-2.5 (3) MG/3ML IN SOLN
3.0000 mL | RESPIRATORY_TRACT | Status: DC
  Administered 2022-04-24 – 2022-04-25 (×11): 3 mL via RESPIRATORY_TRACT
  Filled 2022-04-24 (×11): qty 3

## 2022-04-24 MED ORDER — OSELTAMIVIR PHOSPHATE 30 MG PO CAPS
30.0000 mg | ORAL_CAPSULE | Freq: Two times a day (BID) | ORAL | Status: DC
  Administered 2022-04-24 – 2022-04-27 (×7): 30 mg via ORAL
  Filled 2022-04-24 (×8): qty 1

## 2022-04-24 NOTE — Progress Notes (Signed)
Rt placed patient on BIPAP for the evening to assist with her elevated respirations. RT explained the purpose of the BiPAP and to help open the small placed that have collapsed in on themselves. Patient is tolerating BiPAP well at this time and HFNC is at bedside if needed. RT will continue to monitor patient for any changes with assistance form RN evaluating her status as well.

## 2022-04-24 NOTE — Progress Notes (Signed)
Patient is having Ventricular Bigeminy consecutively, HR in  105-120s, Sinus rhythm, BP 149/62, RR 20-40s, irregular and shallow, MD made aware, waiting for his BMP this AM, will continue to monitor.

## 2022-04-24 NOTE — Progress Notes (Signed)
PROGRESS NOTE     Courtney Zuniga, is a 72 y.o. female, DOB - Aug 13, 1950, FUX:[id number]  Admit date - 04/23/2022   Admitting Physician Ayiana Winslett Denton Brick, MD  Outpatient Primary MD for the patient is [name], FNP  LOS - 1  Chief Complaint  Patient presents with   Shortness of Breath        Brief Narrative:  72 y.o. female with medical history significant of PD, depression, GERD, hypertension, lung cancer, chronic respiratory failure requiring 4 L nasal cannula admitted on 04/23/2022 with acute COPD exacerbation secondary to influenza B and worsening hypoxia    -Assessment and Plan: 1)Acute COPD exacerbation secondary to influenza B--- POA -patient remains quite symptomatic with cough wheezing dyspnea and worsening hypoxia -Continue iv solumedrol,  mucolytics and bronchodilators -Hypoxia and work of breathing worsening---   2)Influenza B--POA patient is vaccinated against the flu -Please see #1 above -Tamiflu as ordered   3) acute on chronic hypoxic respiratory failure--POA, due to #1 and #2 above -Initially required BiPAP -At baseline patient requires 4 L of oxygen via nasal cannula -Currently on 6 L -Cough, wheezing, dyspnea and increased work of breathing persist- -Further management as above #1 #2 -Echo from 04/18/22 remarkable for EF of 65 to 70%, -BiPAP as needed and nightly   4)GERD-stable, continue Protonix especially while on steroids   5)Rt Lung Cancer--stage 3- --she was scheduled to get chemo treatments on 04/23/2021--deferred for now -Chemo currently on hold due to above   6)HLD-continue atorvastatin   7)Depression/anxiety--continue Cymbalta   8)FTT--adult failure to thrive, outpatient dietitian consult has been rescheduled for 05/20/2022 oncology clinic -Encourage supplements and multivitamin  Status is: Inpatient   Disposition: The patient is from: Home              Anticipated d/c is to: Home              Anticipated d/c date is: 2 days               Patient currently is not medically stable to d/c. Barriers: Not Clinically Stable-   Code Status :  -  Code Status: Full Code   Family Communication:    NA (patient is alert, awake and coherent)   DVT Prophylaxis  :   - SCDs   heparin injection 5,000 Units Start: 04/23/22 0600 SCDs Start: 04/23/22 0551   Lab Results  Component Value Date   PLT 221 04/24/2022    Inpatient Medications  Scheduled Meds:  atorvastatin  10 mg Oral Daily   Chlorhexidine Gluconate Cloth  6 each Topical Daily   DULoxetine  60 mg Oral Daily   feeding supplement  237 mL Oral TID BM   fluticasone furoate-vilanterol  1 puff Inhalation Daily   heparin  5,000 Units Subcutaneous Q8H   ipratropium-albuterol  3 mL Nebulization Q4H   methylPREDNISolone (SOLU-MEDROL) injection  40 mg Intravenous BID   oseltamivir  30 mg Oral BID   pantoprazole  40 mg Oral Daily   tamsulosin  0.4 mg Oral QPC supper   umeclidinium bromide  1 puff Inhalation Daily   Continuous Infusions: PRN Meds:.acetaminophen **OR** acetaminophen, albuterol, morphine injection, ondansetron **OR** ondansetron (ZOFRAN) IV, oxyCODONE   Anti-infectives (From admission, onward)    Start     Dose/Rate Route Frequency Ordered Stop   04/24/22 1015  oseltamivir (TAMIFLU) capsule 30 mg        30 mg Oral 2 times daily 04/24/22 0919 04/28/22 0959   04/23/22 0815  oseltamivir (  TAMIFLU) capsule 75 mg  Status:  Discontinued        75 mg Oral 2 times daily 04/23/22 0811 04/24/22 0919         Subjective: Layonna Bostwick today has no fevers, no emesis,  No chest pain,   - Cough, wheezing, dyspnea and increased work of breathing persist- -  Objective: Vitals:   04/24/22 0700 04/24/22 0740 04/24/22 0749 04/24/22 0754  BP: (!) 146/83     Pulse: (!) 113     Resp: (!) 25     Temp:   98.5 F (36.9 C)   TempSrc:   Oral   SpO2: 95% 92%  94%  Weight:      Height:        Intake/Output Summary (Last 24 hours) at 04/24/2022 1006 Last data  filed at 04/24/2022 0345 Gross per 24 hour  Intake --  Output 1050 ml  Net -1050 ml   Filed Weights   04/23/22 0233 04/24/22 0334  Weight: 64 kg 55.5 kg    Physical Exam  Gen:- Awake Alert, conversational dyspnea,  HEENT:- [place].AT, No sclera icterus, left eye ptosis is not new Nose-6L/min Neck-Supple Neck,No JVD,.  Lungs-diminished breath sounds, scattered wheezes bilaterally , increased work of breathing  CV- S1, S2 normal, RRR, right-sided Port-A-Cath Abd-  +ve B.Sounds, Abd Soft, No tenderness,    Extremity/Skin:- No  edema,   good pedal pulses  Psych-affect is appropriate, oriented x3 Neuro-no new focal deficits, no tremors  Data Reviewed: I have personally reviewed following labs and imaging studies  CBC: Recent Labs  Lab 04/23/22 0252 04/24/22 0216  WBC 7.1 10.7*  NEUTROABS 5.8 9.6*  HGB 11.1* 11.1*  HCT 33.9* 35.1*  MCV 83.3 84.8  PLT 205 196   Basic Metabolic Panel: Recent Labs  Lab 04/23/22 0252 04/24/22 0216  NA 140 134*  K 3.6 5.0  CL 101 94*  CO2 29 35*  GLUCOSE 133* 146*  BUN 17 20  CREATININE 0.50 0.39*  CALCIUM 8.2* 9.0  MG  --  1.9   GFR: Estimated Creatinine Clearance: 55.7 mL/min (A) (by C-G formula based on SCr of 0.39 mg/dL (L)). Liver Function Tests: Recent Labs  Lab 04/24/22 0216  AST 30  ALT 27  ALKPHOS 289*  BILITOT 0.2*  PROT 7.1  ALBUMIN 3.6   Recent Results (from the past 240 hour(s))  Resp panel by RT-PCR (RSV, Flu A&B, Covid) Anterior Nasal Swab     Status: Abnormal   Collection Time: 04/23/22  5:07 AM   Specimen: Anterior Nasal Swab  Result Value Ref Range Status   SARS Coronavirus 2 by RT PCR NEGATIVE NEGATIVE Final    Comment: (NOTE) SARS-CoV-2 target nucleic acids are NOT DETECTED.  The SARS-CoV-2 RNA is generally detectable in upper respiratory specimens during the acute phase of infection. The lowest concentration of SARS-CoV-2 viral copies this assay can detect is 138 copies/mL. A negative result does not  preclude SARS-Cov-2 infection and should not be used as the sole basis for treatment or other patient management decisions. A negative result may occur with  improper specimen collection/handling, submission of specimen other than nasopharyngeal swab, presence of viral mutation(s) within the areas targeted by this assay, and inadequate number of viral copies(<138 copies/mL). A negative result must be combined with clinical observations, patient history, and epidemiological information. The expected result is Negative.  Fact Sheet for Patients:  EntrepreneurPulse.com.au  Fact Sheet for Healthcare Providers:  IncredibleEmployment.be  This test is no t yet approved  or cleared by the [place] and  has been authorized for detection and/or diagnosis of SARS-CoV-2 by FDA under an Emergency Use Authorization (EUA). This EUA will remain  in effect (meaning this test can be used) for the duration of the COVID-19 declaration under Section 564(b)(1) of the Act, 21 U.S.C.section 360bbb-3(b)(1), unless the authorization is terminated  or revoked sooner.       Influenza A by PCR NEGATIVE NEGATIVE Final   Influenza B by PCR POSITIVE (A) NEGATIVE Final    Comment: (NOTE) The Xpert Xpress SARS-CoV-2/FLU/RSV plus assay is intended as an aid in the diagnosis of influenza from Nasopharyngeal swab specimens and should not be used as a sole basis for treatment. Nasal washings and aspirates are unacceptable for Xpert Xpress SARS-CoV-2/FLU/RSV testing.  Fact Sheet for Patients: EntrepreneurPulse.com.au  Fact Sheet for Healthcare Providers: IncredibleEmployment.be  This test is not yet approved or cleared by the [place] FDA and has been authorized for detection and/or diagnosis of SARS-CoV-2 by FDA under an Emergency Use Authorization (EUA). This EUA will remain in effect (meaning this test can be used) for the  duration of the COVID-19 declaration under Section 564(b)(1) of the Act, 21 U.S.C. section 360bbb-3(b)(1), unless the authorization is terminated or revoked.     Resp Syncytial Virus by PCR NEGATIVE NEGATIVE Final    Comment: (NOTE) Fact Sheet for Patients: EntrepreneurPulse.com.au  Fact Sheet for Healthcare Providers: IncredibleEmployment.be  This test is not yet approved or cleared by the [place] FDA and has been authorized for detection and/or diagnosis of SARS-CoV-2 by FDA under an Emergency Use Authorization (EUA). This EUA will remain in effect (meaning this test can be used) for the duration of the COVID-19 declaration under Section 564(b)(1) of the Act, 21 U.S.C. section 360bbb-3(b)(1), unless the authorization is terminated or revoked.  Performed at [facility], [address] [place], [place] [address]     Radiology Studies: [facility] Chest [facility] 1 View  Result Date: 04/23/2022 CLINICAL DATA:  Shortness of breath. History of COPD and lung cancer. EXAM: PORTABLE CHEST 1 VIEW COMPARISON:  CT chest 01/28/2022 and radiographs 12/14/2021 FINDINGS: Accessed right chest wall Port-A-Cath tip in the low SVC. Stable cardiomediastinal silhouette. Aortic atherosclerotic calcification. Hyperinflation. Chronic bronchitic change and interstitial coarsening. Bibasilar atelectasis/scarring biapical pleural-parenchymal scarring. No focal consolidation, pleural effusion, or pneumothorax. IMPRESSION: No focal pneumonia.  COPD. Electronically Signed   By: Placido Sou M.D.   On: 04/23/2022 03:09     Scheduled Meds:  atorvastatin  10 mg Oral Daily   Chlorhexidine Gluconate Cloth  6 each Topical Daily   DULoxetine  60 mg Oral Daily   feeding supplement  237 mL Oral TID BM   fluticasone furoate-vilanterol  1 puff Inhalation Daily   heparin  5,000 Units Subcutaneous Q8H   ipratropium-albuterol  3 mL Nebulization Q4H   methylPREDNISolone (SOLU-MEDROL)  injection  40 mg Intravenous BID   oseltamivir  30 mg Oral BID   pantoprazole  40 mg Oral Daily   tamsulosin  0.4 mg Oral QPC supper   umeclidinium bromide  1 puff Inhalation Daily  Continuous Infusions:   LOS: 1 day   Roxan Hockey M.D on 04/24/2022 at 10:06 AM  Go to [email or web address] - for contact info  [facility] - Office  [phone]  If 7PM-7AM, please contact night-coverage [email or web address] 04/24/2022, 10:06 AM   IV Solu-Medrol

## 2022-04-24 NOTE — Plan of Care (Signed)
  Problem: Education: Goal: Knowledge of disease or condition will improve Outcome: Progressing   Problem: Activity: Goal: Ability to tolerate increased activity will improve Outcome: Progressing   Problem: Respiratory: Goal: Ability to maintain a clear airway will improve Outcome: Progressing Goal: Ability to maintain adequate ventilation will improve Outcome: Progressing   Problem: Nutrition: Goal: Adequate nutrition will be maintained Outcome: Progressing   Problem: Skin Integrity: Goal: Risk for impaired skin integrity will decrease Outcome: Progressing

## 2022-04-25 DIAGNOSIS — J441 Chronic obstructive pulmonary disease with (acute) exacerbation: Secondary | ICD-10-CM | POA: Diagnosis not present

## 2022-04-25 MED ORDER — POLYETHYLENE GLYCOL 3350 17 G PO PACK
17.0000 g | PACK | Freq: Every day | ORAL | Status: DC | PRN
  Administered 2022-04-25 – 2022-04-26 (×2): 17 g via ORAL
  Filled 2022-04-25 (×2): qty 1

## 2022-04-25 NOTE — Progress Notes (Signed)
Patient pulled BIPAP off and asking for a break. Patient placed on 6L , oxygen saturation 95% at this time. RT made aware.

## 2022-04-25 NOTE — Progress Notes (Signed)
PROGRESS NOTE     Courtney Zuniga, is a 72 y.o. female, DOB - October 17, 1950, [id number]:[id number]  Admit date - 04/23/2022   Admitting Physician Keynan Heffern Mariea Clonts, MD  Outpatient Primary MD for the patient is Oneal Grout, FNP  LOS - 2  Chief Complaint  Patient presents with   Shortness of Breath      Brief Narrative:  72 y.o. female with medical history significant of PD, depression, GERD, hypertension, lung cancer, chronic respiratory failure requiring 4 L nasal cannula admitted on 04/23/2022 with acute COPD exacerbation secondary to influenza B and worsening hypoxia    -Assessment and Plan: 1)Acute COPD exacerbation secondary to influenza B--- POA -patient remains quite symptomatic with cough wheezing dyspnea and worsening hypoxia -Continue iv solumedrol,  mucolytics and bronchodilators -Hypoxia and increased work of breathing persist --- -Tolerated BiPAP for few hours overnight -Desaturated with attempts to ambulate to the bedside, despite being on 4 to 5 L of oxygen via nasal cannula   2)Influenza B--POA patient is vaccinated against the flu -Please see #1 above -Tamiflu as ordered   3) acute on chronic hypoxic respiratory failure--POA, due to #1 and #2 above -Initially required BiPAP -At baseline patient requires 4 L of oxygen via nasal cannula -Currently on 5 L -Cough, wheezing, dyspnea and increased work of breathing persist- -Further management as above #1 #2 -Echo from 04/18/22 remarkable for EF of 65 to 70%, -BiPAP as needed and nightly   4)GERD-stable, continue Protonix especially while on steroids   5)Rt Lung Cancer--stage 3- --she was scheduled to get chemo treatments on 04/23/2021--deferred for now -Chemo currently on hold due to above   6)HLD-continue atorvastatin   7)Depression/anxiety--continue Cymbalta   8)FTT--adult failure to thrive, outpatient dietitian consult has been rescheduled for 05/20/2022 oncology clinic -Encourage supplements and  multivitamin  Status is: Inpatient   Disposition: The patient is from: Home              Anticipated d/c is to: Home              Anticipated d/c date is: 1 day              Patient currently is not medically stable to d/c. Barriers: Not Clinically Stable-   Code Status :  -  Code Status: Full Code   Family Communication:    NA (patient is alert, awake and coherent)   DVT Prophylaxis  :   - SCDs   heparin injection 5,000 Units Start: 04/23/22 0600 SCDs Start: 04/23/22 0551   Lab Results  Component Value Date   PLT 221 04/24/2022    Inpatient Medications  Scheduled Meds:  atorvastatin  10 mg Oral Daily   Chlorhexidine Gluconate Cloth  6 each Topical Daily   DULoxetine  60 mg Oral Daily   feeding supplement  237 mL Oral TID BM   fluticasone furoate-vilanterol  1 puff Inhalation Daily   heparin  5,000 Units Subcutaneous Q8H   ipratropium-albuterol  3 mL Nebulization Q4H   methylPREDNISolone (SOLU-MEDROL) injection  40 mg Intravenous BID   oseltamivir  30 mg Oral BID   pantoprazole  40 mg Oral Daily   tamsulosin  0.4 mg Oral QPC supper   umeclidinium bromide  1 puff Inhalation Daily   Continuous Infusions: PRN Meds:.acetaminophen **OR** acetaminophen, albuterol, morphine injection, ondansetron **OR** ondansetron (ZOFRAN) IV, oxyCODONE, polyethylene glycol   Anti-infectives (From admission, onward)    Start     Dose/Rate Route Frequency Ordered Stop   04/24/22 1015  oseltamivir (TAMIFLU) capsule 30 mg        30 mg Oral 2 times daily 04/24/22 0919 04/28/22 0959   04/23/22 0815  oseltamivir (TAMIFLU) capsule 75 mg  Status:  Discontinued        75 mg Oral 2 times daily 04/23/22 0811 04/24/22 0919         Subjective: Courtney Zuniga today has no fevers, no emesis,  No chest pain,   - ---Tolerated BiPAP for few hours overnight -Desaturated with attempts to ambulate to the bedside, despite being on 4 to 5 L of oxygen via nasal cannula -Patient's son is at bedside,  questions answered  Objective: Vitals:   04/25/22 1800 04/25/22 1900 04/25/22 1956 04/25/22 2010  BP: 131/74 134/75    Pulse: (!) 113 (!) 116    Resp: 18 (!) 31    Temp:    98.7 F (37.1 C)  TempSrc:    Oral  SpO2: 92% 95% 97%   Weight:      Height:        Intake/Output Summary (Last 24 hours) at 04/25/2022 2014 Last data filed at 04/25/2022 0800 Gross per 24 hour  Intake --  Output 450 ml  Net -450 ml   Filed Weights   04/23/22 0233 04/24/22 0334  Weight: 64 kg 55.5 kg   Physical Exam  Gen:- Awake Alert, conversational dyspnea,  HEENT:- [place].AT, No sclera icterus, left eye ptosis is not new Nose-5L/min Neck-Supple Neck,No JVD,.  Lungs-diminished breath sounds, scattered wheezes bilaterally , increased work of breathing  CV- S1, S2 normal, RRR, right-sided Port-A-Cath Abd-  +ve B.Sounds, Abd Soft, No tenderness,    Extremity/Skin:- No  edema,   good pedal pulses  Psych-affect is appropriate, oriented x3 Neuro-no new focal deficits, no tremors  Data Reviewed: I have personally reviewed following labs and imaging studies  CBC: Recent Labs  Lab 04/23/22 0252 04/24/22 0216  WBC 7.1 10.7*  NEUTROABS 5.8 9.6*  HGB 11.1* 11.1*  HCT 33.9* 35.1*  MCV 83.3 84.8  PLT 205 221   Basic Metabolic Panel: Recent Labs  Lab 04/23/22 0252 04/24/22 0216  NA 140 134*  K 3.6 5.0  CL 101 94*  CO2 29 35*  GLUCOSE 133* 146*  BUN 17 20  CREATININE 0.50 0.39*  CALCIUM 8.2* 9.0  MG  --  1.9   GFR: Estimated Creatinine Clearance: 55.7 mL/min (A) (by C-G formula based on SCr of 0.39 mg/dL (L)). Liver Function Tests: Recent Labs  Lab 04/24/22 0216  AST 30  ALT 27  ALKPHOS 289*  BILITOT 0.2*  PROT 7.1  ALBUMIN 3.6   Recent Results (from the past 240 hour(s))  Resp panel by RT-PCR (RSV, Flu A&B, Covid) Anterior Nasal Swab     Status: Abnormal   Collection Time: 04/23/22  5:07 AM   Specimen: Anterior Nasal Swab  Result Value Ref Range Status   SARS Coronavirus 2 by RT  PCR NEGATIVE NEGATIVE Final    Comment: (NOTE) SARS-CoV-2 target nucleic acids are NOT DETECTED.  The SARS-CoV-2 RNA is generally detectable in upper respiratory specimens during the acute phase of infection. The lowest concentration of SARS-CoV-2 viral copies this assay can detect is 138 copies/mL. A negative result does not preclude SARS-Cov-2 infection and should not be used as the sole basis for treatment or other patient management decisions. A negative result may occur with  improper specimen collection/handling, submission of specimen other than nasopharyngeal swab, presence of viral mutation(s) within the areas targeted by this  assay, and inadequate number of viral copies(<138 copies/mL). A negative result must be combined with clinical observations, patient history, and epidemiological information. The expected result is Negative.  Fact Sheet for Patients:  BloggerCourse.com  Fact Sheet for Healthcare Providers:  SeriousBroker.it  This test is no t yet approved or cleared by the [place] FDA and  has been authorized for detection and/or diagnosis of SARS-CoV-2 by FDA under an Emergency Use Authorization (EUA). This EUA will remain  in effect (meaning this test can be used) for the duration of the COVID-19 declaration under Section 564(b)(1) of the Act, 21 U.S.C.section 360bbb-3(b)(1), unless the authorization is terminated  or revoked sooner.       Influenza A by PCR NEGATIVE NEGATIVE Final   Influenza B by PCR POSITIVE (A) NEGATIVE Final    Comment: (NOTE) The Xpert Xpress SARS-CoV-2/FLU/RSV plus assay is intended as an aid in the diagnosis of influenza from Nasopharyngeal swab specimens and should not be used as a sole basis for treatment. Nasal washings and aspirates are unacceptable for Xpert Xpress SARS-CoV-2/FLU/RSV testing.  Fact Sheet for Patients: BloggerCourse.com  Fact Sheet  for Healthcare Providers: SeriousBroker.it  This test is not yet approved or cleared by the [place] FDA and has been authorized for detection and/or diagnosis of SARS-CoV-2 by FDA under an Emergency Use Authorization (EUA). This EUA will remain in effect (meaning this test can be used) for the duration of the COVID-19 declaration under Section 564(b)(1) of the Act, 21 U.S.C. section 360bbb-3(b)(1), unless the authorization is terminated or revoked.     Resp Syncytial Virus by PCR NEGATIVE NEGATIVE Final    Comment: (NOTE) Fact Sheet for Patients: BloggerCourse.com  Fact Sheet for Healthcare Providers: SeriousBroker.it  This test is not yet approved or cleared by the [place] FDA and has been authorized for detection and/or diagnosis of SARS-CoV-2 by FDA under an Emergency Use Authorization (EUA). This EUA will remain in effect (meaning this test can be used) for the duration of the COVID-19 declaration under Section 564(b)(1) of the Act, 21 U.S.C. section 360bbb-3(b)(1), unless the authorization is terminated or revoked.  Performed at [facility], [address], [place], [place] [address]   MRSA Next Gen by PCR, Nasal     Status: None   Collection Time: 04/23/22  6:00 PM   Specimen: Nasal Mucosa; Nasal Swab  Result Value Ref Range Status   MRSA by PCR Next Gen NOT DETECTED NOT DETECTED Final    Comment: (NOTE) The GeneXpert MRSA Assay (FDA approved for NASAL specimens only), is one component of a comprehensive MRSA colonization surveillance program. It is not intended to diagnose MRSA infection nor to guide or monitor treatment for MRSA infections. Test performance is not FDA approved in patients less than 23 years old. Performed at [facility], [address] [place], [place] [address]     Radiology Studies: No results found.  Scheduled Meds:  atorvastatin  10 mg Oral Daily    Chlorhexidine Gluconate Cloth  6 each Topical Daily   DULoxetine  60 mg Oral Daily   feeding supplement  237 mL Oral TID BM   fluticasone furoate-vilanterol  1 puff Inhalation Daily   heparin  5,000 Units Subcutaneous Q8H   ipratropium-albuterol  3 mL Nebulization Q4H   methylPREDNISolone (SOLU-MEDROL) injection  40 mg Intravenous BID   oseltamivir  30 mg Oral BID   pantoprazole  40 mg Oral Daily   tamsulosin  0.4 mg Oral QPC supper   umeclidinium bromide  1 puff Inhalation Daily  Continuous Infusions:   LOS: 2 days   Shon Hale M.D on 04/25/2022 at 8:14 PM  Go to [email or web address] - for contact info  [facility] - Office  [phone]  If 7PM-7AM, please contact night-coverage [email or web address] 04/25/2022, 8:14 PM   IV Solu-Medrol

## 2022-04-26 DIAGNOSIS — J441 Chronic obstructive pulmonary disease with (acute) exacerbation: Secondary | ICD-10-CM | POA: Diagnosis not present

## 2022-04-26 LAB — BASIC METABOLIC PANEL
Anion gap: 11 (ref 5–15)
BUN: 13 mg/dL (ref 8–23)
CO2: 35 mmol/L — ABNORMAL HIGH (ref 22–32)
Calcium: 8.6 mg/dL — ABNORMAL LOW (ref 8.9–10.3)
Chloride: 85 mmol/L — ABNORMAL LOW (ref 98–111)
Creatinine, Ser: <0.3 mg/dL — ABNORMAL LOW (ref 0.44–1.00)
Glucose, Bld: 120 mg/dL — ABNORMAL HIGH (ref 70–99)
Potassium: 4.6 mmol/L (ref 3.5–5.1)
Sodium: 131 mmol/L — ABNORMAL LOW (ref 135–145)

## 2022-04-26 MED ORDER — TRELEGY ELLIPTA 100-62.5-25 MCG/ACT IN AEPB: 1.0000 | INHALATION_SPRAY | Freq: Every day | RESPIRATORY_TRACT | 3 refills | Status: DC

## 2022-04-26 MED ORDER — PREDNISONE 20 MG PO TABS: 40.0000 mg | ORAL_TABLET | Freq: Every day | ORAL | 0 refills | Status: AC

## 2022-04-26 MED ORDER — ALBUTEROL SULFATE HFA 108 (90 BASE) MCG/ACT IN AERS: 2.0000 | INHALATION_SPRAY | RESPIRATORY_TRACT | 3 refills | Status: DC | PRN

## 2022-04-26 MED ORDER — TOPIRAMATE 25 MG PO TABS: 25.0000 mg | ORAL_TABLET | Freq: Two times a day (BID) | ORAL | 2 refills | Status: DC

## 2022-04-26 MED ORDER — METOPROLOL TARTRATE 25 MG PO TABS: 25.0000 mg | ORAL_TABLET | Freq: Two times a day (BID) | ORAL | 3 refills | Status: DC

## 2022-04-26 MED ORDER — LACTULOSE 10 GM/15ML PO SOLN
30.0000 g | Freq: Once | ORAL | Status: AC
  Administered 2022-04-26: 30 g via ORAL
  Filled 2022-04-26: qty 60

## 2022-04-26 MED ORDER — VITAMIN D (ERGOCALCIFEROL) 1.25 MG (50000 UNIT) PO CAPS: 50000.0000 [IU] | ORAL_CAPSULE | ORAL | 3 refills | Status: DC

## 2022-04-26 MED ORDER — DULOXETINE HCL 60 MG PO CPEP: 60.0000 mg | ORAL_CAPSULE | Freq: Two times a day (BID) | ORAL | 3 refills | Status: DC

## 2022-04-26 MED ORDER — TAMSULOSIN HCL 0.4 MG PO CAPS: 0.4000 mg | ORAL_CAPSULE | Freq: Every day | ORAL | 3 refills | Status: DC

## 2022-04-26 MED ORDER — PROCHLORPERAZINE MALEATE 10 MG PO TABS: 10.0000 mg | ORAL_TABLET | Freq: Four times a day (QID) | ORAL | 0 refills | Status: DC | PRN

## 2022-04-26 MED ORDER — FLUTICASONE FUROATE-VILANTEROL 200-25 MCG/ACT IN AEPB: 1.0000 | INHALATION_SPRAY | Freq: Every day | RESPIRATORY_TRACT | 4 refills | Status: DC

## 2022-04-26 MED ORDER — BISACODYL 10 MG RE SUPP
10.0000 mg | Freq: Once | RECTAL | Status: AC
  Administered 2022-04-26: 10 mg via RECTAL
  Filled 2022-04-26: qty 1

## 2022-04-26 MED ORDER — OSELTAMIVIR PHOSPHATE 30 MG PO CAPS: 30.0000 mg | ORAL_CAPSULE | Freq: Two times a day (BID) | ORAL | 0 refills | Status: DC

## 2022-04-26 MED ORDER — IPRATROPIUM-ALBUTEROL 0.5-2.5 (3) MG/3ML IN SOLN
3.0000 mL | Freq: Four times a day (QID) | RESPIRATORY_TRACT | Status: DC
  Administered 2022-04-26 – 2022-04-27 (×5): 3 mL via RESPIRATORY_TRACT
  Filled 2022-04-26 (×6): qty 3

## 2022-04-26 MED ORDER — METOPROLOL TARTRATE 50 MG PO TABS
50.0000 mg | ORAL_TABLET | Freq: Two times a day (BID) | ORAL | Status: DC
  Administered 2022-04-26 – 2022-04-27 (×2): 50 mg via ORAL
  Filled 2022-04-26 (×2): qty 1

## 2022-04-26 MED ORDER — IPRATROPIUM-ALBUTEROL 0.5-2.5 (3) MG/3ML IN SOLN: 3.0000 mL | Freq: Four times a day (QID) | RESPIRATORY_TRACT | 2 refills | Status: DC | PRN

## 2022-04-26 MED ORDER — PANTOPRAZOLE SODIUM 40 MG PO TBEC: 40.0000 mg | DELAYED_RELEASE_TABLET | Freq: Every day | ORAL | 4 refills | Status: DC

## 2022-04-26 MED ORDER — COMBIVENT RESPIMAT 20-100 MCG/ACT IN AERS: 1.0000 | INHALATION_SPRAY | Freq: Four times a day (QID) | RESPIRATORY_TRACT | 0 refills | Status: DC | PRN

## 2022-04-26 NOTE — Progress Notes (Signed)
PROGRESS NOTE     Courtney Zuniga, is a 72 y.o. female, DOB - 02/25/1951, OAT:[id number]  Admit date - 04/23/2022   Admitting Physician Merriel Zinger Mariea Clonts, MD  Outpatient Primary MD for the patient is Oneal Grout, FNP  LOS - 3  Chief Complaint  Patient presents with   Shortness of Breath      Brief Narrative:  72 y.o. female with medical history significant of PD, depression, GERD, hypertension, lung cancer, chronic respiratory failure requiring 4 L nasal cannula admitted on 04/23/2022 with acute COPD exacerbation secondary to influenza B and worsening hypoxia    -Assessment and Plan: 1)Acute COPD exacerbation secondary to influenza B--- POA -patient remains quite symptomatic with cough wheezing dyspnea and worsening hypoxia -Continue iv solumedrol,  mucolytics and bronchodilators -Hypoxia and increased work of breathing persist --- 04/26/22 -Patient was not compliant with BiPAP overnight -Desaturated overnight required O2 increased to 9 L/min ,  -Currently weaned back down to 6 L/min     2)Influenza B--POA patient is vaccinated against the flu -Please see #1 above -Tamiflu as ordered   3) acute on chronic hypoxic respiratory failure--POA, due to #1 and #2 above -Initially required BiPAP -At baseline patient requires 4 L of oxygen via nasal cannula -Currently on 6 L -Cough, wheezing, dyspnea and increased work of breathing persist- -Further management as above #1 #2 -Echo from 04/18/22 remarkable for EF of 65 to 70%, -BiPAP as needed and nightly   4)GERD-stable, continue Protonix especially while on steroids   5)Rt Lung Cancer--stage 3- --she was scheduled to get chemo treatments on 04/23/2021--deferred for now -Chemo currently on hold due to above   6)HLD-continue atorvastatin   7)Depression/anxiety--continue Cymbalta   8)FTT--adult failure to thrive, outpatient dietitian consult has been rescheduled for 05/20/2022 oncology clinic -Encourage supplements and  multivitamin  Status is: Inpatient   Disposition: The patient is from: Home              Anticipated d/c is to: Home              Anticipated d/c date is: 1 day              Patient currently is not medically stable to d/c. Barriers: Not Clinically Stable-   Code Status :  -  Code Status: Full Code   Family Communication:    NA (patient is alert, awake and coherent)   DVT Prophylaxis  :   - SCDs   heparin injection 5,000 Units Start: 04/23/22 0600 SCDs Start: 04/23/22 0551   Lab Results  Component Value Date   PLT 221 04/24/2022    Inpatient Medications  Scheduled Meds:  atorvastatin  10 mg Oral Daily   Chlorhexidine Gluconate Cloth  6 each Topical Daily   DULoxetine  60 mg Oral Daily   feeding supplement  237 mL Oral TID BM   fluticasone furoate-vilanterol  1 puff Inhalation Daily   heparin  5,000 Units Subcutaneous Q8H   ipratropium-albuterol  3 mL Nebulization Q6H   methylPREDNISolone (SOLU-MEDROL) injection  40 mg Intravenous BID   metoprolol tartrate  50 mg Oral BID   oseltamivir  30 mg Oral BID   pantoprazole  40 mg Oral Daily   tamsulosin  0.4 mg Oral QPC supper   umeclidinium bromide  1 puff Inhalation Daily   Continuous Infusions: PRN Meds:.acetaminophen **OR** acetaminophen, albuterol, morphine injection, ondansetron **OR** ondansetron (ZOFRAN) IV, oxyCODONE, polyethylene glycol   Anti-infectives (From admission, onward)    Start  Dose/Rate Route Frequency Ordered Stop   04/26/22 0000  oseltamivir (TAMIFLU) 30 MG capsule        30 mg Oral 2 times daily 04/26/22 1323     04/24/22 1015  oseltamivir (TAMIFLU) capsule 30 mg        30 mg Oral 2 times daily 04/24/22 0919 04/28/22 0959   04/23/22 0815  oseltamivir (TAMIFLU) capsule 75 mg  Status:  Discontinued        75 mg Oral 2 times daily 04/23/22 0811 04/24/22 0919         Subjective: Courtney Zuniga today has no fevers, no emesis,  No chest pain,   - -04/26/22 -Patient was not compliant with  BiPAP overnight -Desaturated overnight required O2 increased to 9 L/min ,  -Currently weaned back down to 6 L/min  -Complains of constipation eventually had a BM with Dulcolax suppository  Objective: Vitals:   04/26/22 0923 04/26/22 1107 04/26/22 1540 04/26/22 1602  BP:      Pulse:      Resp:      Temp:  98.6 F (37 C)  98.6 F (37 C)  TempSrc:  Oral  Oral  SpO2: 100%  98%   Weight:      Height:        Intake/Output Summary (Last 24 hours) at 04/26/2022 1820 Last data filed at 04/26/2022 1713 Gross per 24 hour  Intake 240 ml  Output 1950 ml  Net -1710 ml   Filed Weights   04/23/22 0233 04/24/22 0334 04/26/22 0447  Weight: 64 kg 55.5 kg 54.4 kg   Physical Exam  Gen:- Awake Alert, conversational dyspnea,  HEENT:- [place].AT, No sclera icterus, left eye ptosis is not new Nose-6L/min Neck-Supple Neck,No JVD,.  Lungs-diminished breath sounds, scattered wheezes bilaterally , increased work of breathing  CV- S1, S2 normal, RRR, right-sided Port-A-Cath Abd-  +ve B.Sounds, Abd Soft, No tenderness,    Extremity/Skin:- No  edema,   good pedal pulses  Psych-affect is appropriate, oriented x3 Neuro-no new focal deficits, no tremors  Data Reviewed: I have personally reviewed following labs and imaging studies  CBC: Recent Labs  Lab 04/23/22 0252 04/24/22 0216  WBC 7.1 10.7*  NEUTROABS 5.8 9.6*  HGB 11.1* 11.1*  HCT 33.9* 35.1*  MCV 83.3 84.8  PLT 205 221   Basic Metabolic Panel: Recent Labs  Lab 04/23/22 0252 04/24/22 0216 04/26/22 0237  NA 140 134* 131*  K 3.6 5.0 4.6  CL 101 94* 85*  CO2 29 35* 35*  GLUCOSE 133* 146* 120*  BUN 17 20 13   CREATININE 0.50 0.39* <0.30*  CALCIUM 8.2* 9.0 8.6*  MG  --  1.9  --    GFR: CrCl cannot be calculated (This lab value cannot be used to calculate CrCl because it is not a number: <0.30). Liver Function Tests: Recent Labs  Lab 04/24/22 0216  AST 30  ALT 27  ALKPHOS 289*  BILITOT 0.2*  PROT 7.1  ALBUMIN 3.6   Recent  Results (from the past 240 hour(s))  Resp panel by RT-PCR (RSV, Flu A&B, Covid) Anterior Nasal Swab     Status: Abnormal   Collection Time: 04/23/22  5:07 AM   Specimen: Anterior Nasal Swab  Result Value Ref Range Status   SARS Coronavirus 2 by RT PCR NEGATIVE NEGATIVE Final    Comment: (NOTE) SARS-CoV-2 target nucleic acids are NOT DETECTED.  The SARS-CoV-2 RNA is generally detectable in upper respiratory specimens during the acute phase of infection. The lowest concentration  of SARS-CoV-2 viral copies this assay can detect is 138 copies/mL. A negative result does not preclude SARS-Cov-2 infection and should not be used as the sole basis for treatment or other patient management decisions. A negative result may occur with  improper specimen collection/handling, submission of specimen other than nasopharyngeal swab, presence of viral mutation(s) within the areas targeted by this assay, and inadequate number of viral copies(<138 copies/mL). A negative result must be combined with clinical observations, patient history, and epidemiological information. The expected result is Negative.  Fact Sheet for Patients:  BloggerCourse.com  Fact Sheet for Healthcare Providers:  SeriousBroker.it  This test is no t yet approved or cleared by the [place] FDA and  has been authorized for detection and/or diagnosis of SARS-CoV-2 by FDA under an Emergency Use Authorization (EUA). This EUA will remain  in effect (meaning this test can be used) for the duration of the COVID-19 declaration under Section 564(b)(1) of the Act, 21 U.S.C.section 360bbb-3(b)(1), unless the authorization is terminated  or revoked sooner.       Influenza A by PCR NEGATIVE NEGATIVE Final   Influenza B by PCR POSITIVE (A) NEGATIVE Final    Comment: (NOTE) The Xpert Xpress SARS-CoV-2/FLU/RSV plus assay is intended as an aid in the diagnosis of influenza from  Nasopharyngeal swab specimens and should not be used as a sole basis for treatment. Nasal washings and aspirates are unacceptable for Xpert Xpress SARS-CoV-2/FLU/RSV testing.  Fact Sheet for Patients: BloggerCourse.com  Fact Sheet for Healthcare Providers: SeriousBroker.it  This test is not yet approved or cleared by the [place] FDA and has been authorized for detection and/or diagnosis of SARS-CoV-2 by FDA under an Emergency Use Authorization (EUA). This EUA will remain in effect (meaning this test can be used) for the duration of the COVID-19 declaration under Section 564(b)(1) of the Act, 21 U.S.C. section 360bbb-3(b)(1), unless the authorization is terminated or revoked.     Resp Syncytial Virus by PCR NEGATIVE NEGATIVE Final    Comment: (NOTE) Fact Sheet for Patients: BloggerCourse.com  Fact Sheet for Healthcare Providers: SeriousBroker.it  This test is not yet approved or cleared by the [place] FDA and has been authorized for detection and/or diagnosis of SARS-CoV-2 by FDA under an Emergency Use Authorization (EUA). This EUA will remain in effect (meaning this test can be used) for the duration of the COVID-19 declaration under Section 564(b)(1) of the Act, 21 U.S.C. section 360bbb-3(b)(1), unless the authorization is terminated or revoked.  Performed at [facility], [address] [place], [place] [address]   MRSA Next Gen by PCR, Nasal     Status: None   Collection Time: 04/23/22  6:00 PM   Specimen: Nasal Mucosa; Nasal Swab  Result Value Ref Range Status   MRSA by PCR Next Gen NOT DETECTED NOT DETECTED Final    Comment: (NOTE) The GeneXpert MRSA Assay (FDA approved for NASAL specimens only), is one component of a comprehensive MRSA colonization surveillance program. It is not intended to diagnose MRSA infection nor to guide or monitor treatment for  MRSA infections. Test performance is not FDA approved in patients less than 64 years old. Performed at [facility], [address] [place], [place] [address]     Radiology Studies: No results found.  Scheduled Meds:  atorvastatin  10 mg Oral Daily   Chlorhexidine Gluconate Cloth  6 each Topical Daily   DULoxetine  60 mg Oral Daily   feeding supplement  237 mL Oral TID BM  fluticasone furoate-vilanterol  1 puff Inhalation Daily   heparin  5,000 Units Subcutaneous Q8H   ipratropium-albuterol  3 mL Nebulization Q6H   methylPREDNISolone (SOLU-MEDROL) injection  40 mg Intravenous BID   metoprolol tartrate  50 mg Oral BID   oseltamivir  30 mg Oral BID   pantoprazole  40 mg Oral Daily   tamsulosin  0.4 mg Oral QPC supper   umeclidinium bromide  1 puff Inhalation Daily  Continuous Infusions:   LOS: 3 days   Shon Hale M.D on 04/26/2022 at 6:20 PM  Go to [email or web address] - for contact info  [facility] - Office  [phone]  If 7PM-7AM, please contact night-coverage [email or web address] 04/26/2022, 6:20 PM   IV Solu-Medrol

## 2022-04-26 NOTE — Plan of Care (Signed)
  Problem: Education: Goal: Knowledge of disease or condition will improve Outcome: Progressing Goal: Knowledge of the prescribed therapeutic regimen will improve Outcome: Progressing   Problem: Activity: Goal: Ability to tolerate increased activity will improve Outcome: Progressing   Problem: Respiratory: Goal: Ability to maintain a clear airway will improve Outcome: Progressing Goal: Levels of oxygenation will improve Outcome: Progressing Goal: Ability to maintain adequate ventilation will improve Outcome: Progressing   Problem: Clinical Measurements: Goal: Respiratory complications will improve Outcome: Progressing

## 2022-04-26 NOTE — TOC Transition Note (Addendum)
Transition of Care [facility]) - CM/SW Discharge Note   Patient Details  Name: Courtney Zuniga MRN: [id number] Date of Birth: 1951/03/17  [facility]) CM/SW Contact:  Villa Herb, LCSWA Phone Number: 04/26/2022, 1:04 PM   Clinical Narrative:    CSW updated by MD that pt will need nebulizer machine ordered prior to D/C today. CSW spoke with pt who is agreeable to CSW ordering and does not have a preference in company. CSW spoke to Derry with [employer] who accepts referral and will have nebulizer delivered to pt at hospital. MD also placed orders for [facility] PT. Pt is agreeable to [facility] referral with no agency preference. CSW spoke to Ben[place] with [employer] who accepts pts referral. TOC signing off.   Final next level of care: Home/Self Care Barriers to Discharge: Barriers Resolved   Patient Goals and CMS Choice CMS Medicare.gov Compare Post Acute Care list provided to:: Patient Choice offered to / list presented to : Patient  Discharge Placement                         Discharge Plan and Services Additional resources added to the After Visit Summary for                  DME Arranged: Nebulizer machine DME Agency: [employer] Date DME Agency Contacted: 04/26/22   Representative spoke with at DME Agency: Leavy Cella            Social Determinants of Health (SDOH) Interventions SDOH Screenings   Food Insecurity: No Food Insecurity (04/23/2022)  Housing: Low Risk  (04/23/2022)  Transportation Needs: No Transportation Needs (04/23/2022)  Utilities: Not At Risk (04/23/2022)  Financial Resource Strain: Low Risk  (05/05/2018)  Physical Activity: Insufficiently Active (05/05/2018)  Social Connections: Somewhat Isolated (05/05/2018)  Stress: No Stress Concern Present (05/05/2018)  Tobacco Use: Medium Risk (04/23/2022)     Readmission Risk Interventions    04/26/2022    1:03 PM 08/09/2020   12:41 PM  Readmission Risk Prevention Plan  Medication Screening  Complete  Transportation  Screening Complete Complete  HRI or Home Care Consult Complete   Social Work Consult for Recovery Care Planning/Counseling Complete   Palliative Care Screening Not Applicable   Medication Review Oceanographer) Complete

## 2022-04-27 DIAGNOSIS — J441 Chronic obstructive pulmonary disease with (acute) exacerbation: Secondary | ICD-10-CM | POA: Diagnosis not present

## 2022-04-27 LAB — RENAL FUNCTION PANEL
Albumin: 3 g/dL — ABNORMAL LOW (ref 3.5–5.0)
Anion gap: 11 (ref 5–15)
BUN: 23 mg/dL (ref 8–23)
CO2: 33 mmol/L — ABNORMAL HIGH (ref 22–32)
Calcium: 8.4 mg/dL — ABNORMAL LOW (ref 8.9–10.3)
Chloride: 87 mmol/L — ABNORMAL LOW (ref 98–111)
Creatinine, Ser: 0.48 mg/dL (ref 0.44–1.00)
GFR, Estimated: >60 mL/min (ref >60)
Glucose, Bld: 194 mg/dL — ABNORMAL HIGH (ref 70–99)
Phosphorus: 2.3 mg/dL — ABNORMAL LOW (ref 2.5–4.6)
Potassium: 4.5 mmol/L (ref 3.5–5.1)
Sodium: 131 mmol/L — ABNORMAL LOW (ref 135–145)

## 2022-04-27 MED ORDER — HEPARIN SOD (PORK) LOCK FLUSH 100 UNIT/ML IV SOLN
500.0000 [IU] | Freq: Once | INTRAVENOUS | Status: DC
  Filled 2022-04-27: qty 5

## 2022-04-27 NOTE — TOC Transition Note (Signed)
Transition of Care [facility]) - CM/SW Discharge Note   Patient Details  Name: Courtney Zuniga MRN: [id number] Date of Birth: 1951/02/22  [facility]) CM/SW Contact:  Karn Cassis, LCSW Phone Number: 04/27/2022, 11:33 AM   Clinical Narrative: Pt d/c today. Per [employer], nebulizer was delivered to room yesterday. Cory with [facility] notified of d/c. Orders in.       Final next level of care: Home w Home Health Services Barriers to Discharge: Barriers Resolved   Patient Goals and CMS Choice CMS Medicare.gov Compare Post Acute Care list provided to:: Patient Choice offered to / list presented to : Patient  Discharge Placement                         Discharge Plan and Services Additional resources added to the After Visit Summary for                  DME Arranged: Nebulizer machine DME Agency: [employer] Date DME Agency Contacted: 04/26/22   Representative spoke with at DME Agency: Leavy Cella HH Arranged: PT HH Agency: [facility] Date [facility] Agency Contacted: 04/27/22 Time HH Agency Contacted: 1133 Representative spoke with at [facility] Agency: Kandee Keen  Social Determinants of Health (SDOH) Interventions SDOH Screenings   Food Insecurity: No Food Insecurity (04/23/2022)  Housing: Low Risk  (04/23/2022)  Transportation Needs: No Transportation Needs (04/23/2022)  Utilities: Not At Risk (04/23/2022)  Financial Resource Strain: Low Risk  (05/05/2018)  Physical Activity: Insufficiently Active (05/05/2018)  Social Connections: Somewhat Isolated (05/05/2018)  Stress: No Stress Concern Present (05/05/2018)  Tobacco Use: Medium Risk (04/23/2022)     Readmission Risk Interventions    04/26/2022    1:03 PM 08/09/2020   12:41 PM  Readmission Risk Prevention Plan  Medication Screening  Complete  Transportation Screening Complete Complete  HRI or Home Care Consult Complete   Social Work Consult for Recovery Care Planning/Counseling Complete   Palliative Care  Screening Not Applicable   Medication Review Oceanographer) Complete

## 2022-04-27 NOTE — Discharge Instructions (Addendum)
1)You need oxygen at home at 4 L via nasal cannula continuously while awake and while asleep--- smoking or having open fires around oxygen can cause fire, significant injury and death 2)Please follow-up with hematologist/oncologist Dr. Doreatha Massed, MD -Address: inside [facility] (4th Floor), [address] [place], [place], [place] [address] Phone: [phone] 3)Repeat CBC and CMP Blood tests in about 1 week

## 2022-04-27 NOTE — Progress Notes (Signed)
Was going to remove [place] access. Patient would not allow me to remove it stating that [facility] placed [place] needle and told her to keep it in. I did not think this is a good Idea and talked to daughter and Ms. Ange and they stated they would talk it over with staff at next scheduled appointment which is this week. I did heparinize [place] to help keep from clotting. Gave paperwork to family and I showed them how to use nebulizer machine. Patient was allowed to dress and was taken by wheelchair to car for trip home.

## 2022-04-27 NOTE — Discharge Summary (Signed)
Courtney Zuniga, is a 72 y.o. female  DOB Aug 16, 1950  MRN [id number].  Admission date:  04/23/2022  Admitting Physician  Shon Hale, MD  Discharge Date:  04/27/2022   Primary MD  Oneal Grout, FNP  Recommendations for primary care physician for things to follow:  1)You need oxygen at home at 4 L via nasal cannula continuously while awake and while asleep--- smoking or having open fires around oxygen can cause fire, significant injury and death 2)Please follow-up with hematologist/oncologist Dr. Doreatha Massed, MD -Address: inside [facility] (4th Floor), [address] [place], [place], [place] [address] Phone: [phone] 3)Repeat CBC and CMP Blood tests in about 1 week  Admission Diagnosis  Influenza B [J10.1] COPD exacerbation (HCC) [J44.1]   Discharge Diagnosis  Influenza B [J10.1] COPD exacerbation (HCC) [J44.1]    Principal Problem:   COPD exacerbation (HCC) Active Problems:   Influenza B   HTN (hypertension)   Adenocarcinoma of right lung, stage 3 (HCC)   Acute respiratory failure with hypoxia (HCC)      Past Medical History:  Diagnosis Date   Anemia    as a young woman   Arthritis    osteoartritis   Asthma    Brain tumor (benign) (HCC) 2005 [facility]   Benign   Chronic headaches    Chronic hip pain    Chronic pain    COPD (chronic obstructive pulmonary disease) (HCC)    Coronary artery disease    Depression    Depression 05/15/2016   Encounter for antineoplastic chemotherapy 01/10/2016   GERD (gastroesophageal reflux disease)    Hypertension    Lung cancer (HCC) dx'd 01/2016   currently on chemo and radiation    NSTEMI (non-ST elevated myocardial infarction) (HCC) yrs ago   On home O2    qhs 2 liters at hs and prn   Pneumonia last time 2 yrs ago   Shortness of breath dyspnea    with activity    Past Surgical History:  Procedure Laterality Date   CHOLECYSTECTOMY      COLONOSCOPY  2015   Results requested from [facility]   COLONOSCOPY     ESOPHAGOGASTRODUODENOSCOPY N/A 08/14/2015   Procedure: ESOPHAGOGASTRODUODENOSCOPY (EGD);  Surgeon: Corbin Ade, MD;  [facility]: [facility];  Service: Endoscopy;  Laterality: N/A;  215    ESOPHAGOGASTRODUODENOSCOPY (EGD) WITH PROPOFOL N/A 09/13/2015   Procedure: ESOPHAGOGASTRODUODENOSCOPY (EGD) WITH PROPOFOL;  Surgeon: Rachael Fee, MD;  Location: [facility] ENDOSCOPY;  Service: Endoscopy;  Laterality: N/A;   EUS N/A 03/12/2017   Procedure: UPPER ENDOSCOPIC ULTRASOUND (EUS) RADIAL;  Surgeon: Rachael Fee, MD;  Location: [facility] ENDOSCOPY;  Service: Endoscopy;  Laterality: N/A;   IR IMAGING GUIDED PORT INSERTION  01/20/2022   TUMOR REMOVAL  2005   Benign   UPPER ESOPHAGEAL ENDOSCOPIC ULTRASOUND (EUS)  09/13/2015   Procedure: UPPER ESOPHAGEAL ENDOSCOPIC ULTRASOUND (EUS);  Surgeon: Rachael Fee, MD;  Location: [facility];  Service: Endoscopy;;   VIDEO BRONCHOSCOPY WITH ENDOBRONCHIAL NAVIGATION N/A 12/31/2015  Procedure: VIDEO BRONCHOSCOPY WITH ENDOBRONCHIAL NAVIGATION;  Surgeon: Loreli Slot, MD;  Location: [facility] OR;  Service: Thoracic;  Laterality: N/A;   VIDEO BRONCHOSCOPY WITH ENDOBRONCHIAL ULTRASOUND N/A 11/08/2015   Procedure: VIDEO BRONCHOSCOPY WITH ENDOBRONCHIAL ULTRASOUND;  Surgeon: Kerin Perna, MD;  Location: [facility] OR;  Service: Thoracic;  Laterality: N/A;     HPI  from the history and physical done on the day of admission:    HPI: Yannis Lean is a 72 y.o. female with medical history significant of PD, depression, GERD, hypertension, lung cancer, chronic respiratory failure requiring 4 L nasal cannula, and more presents the ED with a chief complaint of dyspnea.  This started at 3 PM on January 9.  It was progressively worse.  She had associated cough without production.  She had chest pain that was substernal and felt like a tightness.  Her inhaler improved her symptoms but did not resolve them,  and the severity of the symptoms quickly returned.  She reports at 1 point she did feel near syncopal, but did not pass out.  Patient denies any fever or sick contacts.  She denies any other symptoms including no nausea, vomiting, constipation, dysuria, palpitations.  Patient has no other complaints at this time.   Patient quit smoking 6 months ago.  She does not drink alcohol, does not use illicit drugs.  She is vaccinated for COVID and flu.  Patient is full code. Review of Systems: As mentioned in the history of present illness. All other systems reviewed and are negative.     Hospital Course:     Brief Narrative:  72 y.o. female with medical history significant of PD, depression, GERD, hypertension, lung cancer, chronic respiratory failure requiring 4 L nasal cannula admitted on 04/23/2022 with acute COPD exacerbation secondary to influenza B and worsening hypoxia     -Assessment and Plan: 1)Acute COPD exacerbation secondary to influenza B--- POA -patient remains quite symptomatic with cough wheezing dyspnea and worsening hypoxia -Continue iv solumedrol,  mucolytics and bronchodilators -Hypoxia and increased work of breathing persist --- 04/27/22 -Respiratory status improved significantly overall -Oxygen requirement is back to baseline at 4 L via nasal cannula -Ambulated to bedside commode without significant desaturation      2)Influenza B--POA patient is vaccinated against the flu -Please see #1 above -Complete Tamiflu as ordered   3) acute on chronic hypoxic respiratory failure--POA, due to #1 and #2 above -Initially required BiPAP -At baseline patient requires 4 L of oxygen via nasal cannula -Further management as above #1 #2 -Echo from 04/18/22 remarkable for EF of 65 to 70%, -04/27/22 -Respiratory status improved significantly overall -Oxygen requirement is back to baseline at 4 L via nasal cannula -Ambulated to bedside commode without significant desaturation    4)GERD-stable, continue Protonix especially while on steroids   5)Rt Lung Cancer--stage 3- --she was scheduled to get chemo treatments on 04/23/2021--deferred for now -Chemo currently on hold due to above -Follow-up with oncologist Dr. Ellin Saba as advised   6)HLD-continue atorvastatin   7)Depression/anxiety--continue Cymbalta   8)FTT--adult failure to thrive, outpatient dietitian consult has been rescheduled for 05/20/2022 oncology clinic -Encourage supplements and multivitamin   Disposition: The patient is from: Home              Anticipated d/c is to: Home  Discharge Condition: stable   Follow UP   Follow-up Information     Doreatha Massed, MD. Schedule an appointment as soon as possible for a visit in 1 week(s).   Specialty: Hematology  Contact information: [address] [place] [address] [phone]                 Diet and Activity recommendation:  As advised  Discharge Instructions    Discharge Instructions     Call MD for:  difficulty breathing, headache or visual disturbances   Complete by: As directed    Call MD for:  difficulty breathing, headache or visual disturbances   Complete by: As directed    Call MD for:  persistant dizziness or light-headedness   Complete by: As directed    Call MD for:  persistant dizziness or light-headedness   Complete by: As directed    Call MD for:  persistant nausea and vomiting   Complete by: As directed    Call MD for:  persistant nausea and vomiting   Complete by: As directed    Call MD for:  temperature >100.4   Complete by: As directed    Call MD for:  temperature >100.4   Complete by: As directed    Diet - low sodium heart healthy   Complete by: As directed    Diet - low sodium heart healthy   Complete by: As directed    Discharge instructions   Complete by: As directed    1)You need oxygen at home at 4 L via nasal cannula continuously while awake and while asleep--- smoking or having open fires  around oxygen can cause fire, significant injury and death 2)Please follow-up with hematologist/oncologist Dr. Doreatha Massed, MD -Address: inside [facility] (4th Floor), [address] [place], [place], [place] [address] Phone: [phone] 3)Repeat CBC and CMP Blood tests in about 1 week   Discharge instructions   Complete by: As directed    1)You need oxygen at home at 4 L via nasal cannula continuously while awake and while asleep--- smoking or having open fires around oxygen can cause fire, significant injury and death 2)Please follow-up with hematologist/oncologist Dr. Doreatha Massed, MD -Address: inside [facility] (4th Floor), [address] [place], [place], [place] [address] Phone: [phone] 3)Repeat CBC and CMP Blood tests in about 1 week   Increase activity slowly   Complete by: As directed    Increase activity slowly   Complete by: As directed          Discharge Medications     Allergies as of 04/27/2022       Reactions   Desyrel [trazodone]    "Made me faint"        Medication List     STOP taking these medications    atorvastatin 10 MG tablet Commonly known as: LIPITOR       TAKE these medications    acetaminophen 325 MG tablet Commonly known as: TYLENOL Take 2 tablets (650 mg total) by mouth every 6 (six) hours as needed for mild pain, fever or headache.   albuterol 108 (90 Base) MCG/ACT inhaler Commonly known as: VENTOLIN HFA Inhale 2 puffs into the lungs every 4 (four) hours as needed for shortness of breath.   Combivent Respimat 20-100 MCG/ACT Aers respimat Generic drug: Ipratropium-Albuterol Inhale 1 puff into the lungs every 6 (six) hours as needed for wheezing or shortness of breath. What changed: See the new instructions.   ipratropium-albuterol 0.5-2.5 (3) MG/3ML Soln Commonly known as: DUONEB Take 3 mLs by nebulization every 6 (six) hours as needed. What changed:  when to take this reasons to take this   cyclobenzaprine 10  MG tablet Commonly  known as: FLEXERIL Take 1 tablet (10 mg total) by mouth 2 (two) times daily. *MAy take one additional tablet as needed for muscle spasms   diclofenac sodium 1 % Gel Commonly known as: VOLTAREN Apply topically 4 (four) times daily as needed (massge gel into affected area(s) as needed for pain).   DULoxetine 60 MG capsule Commonly known as: CYMBALTA Take 1 capsule (60 mg total) by mouth 2 (two) times daily. What changed: See the new instructions.   Ensure Take 237 mLs by mouth 3 (three) times daily between meals.   fluticasone furoate-vilanterol 200-25 MCG/ACT Aepb Commonly known as: BREO ELLIPTA Inhale 1 puff into the lungs daily. What changed: when to take this   HYDROcodone bit-homatropine 5-1.5 MG/5ML syrup Commonly known as: Hycodan Take 5 mLs by mouth every 6 (six) hours as needed for cough.   lidocaine-prilocaine cream Commonly known as: EMLA Apply to the Port-A-Cath site 30-60 minutes before treatment   megestrol 625 MG/5ML suspension Commonly known as: Megace ES Take 5 mLs (625 mg total) by mouth daily.   metoprolol tartrate 25 MG tablet Commonly known as: LOPRESSOR Take 1 tablet (25 mg total) by mouth 2 (two) times daily.   oseltamivir 30 MG capsule Commonly known as: TAMIFLU Take 1 capsule (30 mg total) by mouth 2 (two) times daily.   oxyCODONE-acetaminophen 5-325 MG tablet Commonly known as: PERCOCET/ROXICET Take 1 tablet by mouth every 8 (eight) hours as needed for moderate pain.   pantoprazole 40 MG tablet Commonly known as: PROTONIX Take 1 tablet (40 mg total) by mouth daily. What changed:  medication strength how much to take additional instructions   predniSONE 20 MG tablet Commonly known as: DELTASONE Take 1 tablet (20 mg total) by mouth daily with breakfast. What changed: how much to take   predniSONE 20 MG tablet Commonly known as: DELTASONE Take 2 tablets (40 mg total) by mouth daily with breakfast for 5 days. What  changed: You were already taking a medication with the same name, and this prescription was added. Make sure you understand how and when to take each.   prochlorperazine 10 MG tablet Commonly known as: COMPAZINE Take 1 tablet (10 mg total) by mouth every 6 (six) hours as needed for nausea or vomiting.   tamsulosin 0.4 MG Caps capsule Commonly known as: FLOMAX Take 1 capsule (0.4 mg total) by mouth daily after supper.   topiramate 25 MG tablet Commonly known as: Topamax Take 1 tablet (25 mg total) by mouth 2 (two) times daily. What changed:  how much to take how to take this when to take this additional instructions   Trelegy Ellipta 100-62.5-25 MCG/ACT Aepb Generic drug: Fluticasone-Umeclidin-Vilant Inhale 1 puff into the lungs daily.   Vitamin D (Ergocalciferol) 1.25 MG (50000 UNIT) Caps capsule Commonly known as: DRISDOL Take 1 capsule (50,000 Units total) by mouth once a week.   Vitamin D3 125 MCG (5000 UT) Caps Take 1 capsule by mouth daily.               Durable Medical Equipment  (From admission, onward)           Start     Ordered   04/26/22 1255  For home use only DME Nebulizer/meds  Once       Question Answer Comment  Patient needs a nebulizer to treat with the following condition Dyspnea and respiratory abnormalities   Length of Need Lifetime      04/26/22 1254   04/26/22 1254  For home  use only DME Nebulizer machine  Once       Question Answer Comment  Patient needs a nebulizer to treat with the following condition COPD (chronic obstructive pulmonary disease) (HCC)   Length of Need Lifetime      04/26/22 1254            Major procedures and Radiology Reports - PLEASE review detailed and final reports for all details, in brief -   DG Chest Port 1 View  Result Date: 04/23/2022 CLINICAL DATA:  Shortness of breath. History of COPD and lung cancer. EXAM: PORTABLE CHEST 1 VIEW COMPARISON:  CT chest 01/28/2022 and radiographs 12/14/2021  FINDINGS: Accessed right chest wall Port-A-Cath tip in the low SVC. Stable cardiomediastinal silhouette. Aortic atherosclerotic calcification. Hyperinflation. Chronic bronchitic change and interstitial coarsening. Bibasilar atelectasis/scarring biapical pleural-parenchymal scarring. No focal consolidation, pleural effusion, or pneumothorax. IMPRESSION: No focal pneumonia.  COPD. Electronically Signed   By: Minerva Fester M.D.   On: 04/23/2022 03:09   ECHOCARDIOGRAM COMPLETE  Result Date: 04/18/2022    ECHOCARDIOGRAM REPORT   Patient Name:   ELICA ALMAS Date of Exam: 04/18/2022 Medical Rec #:  [id number]       Height:       66.0 in Accession #:    [id number]      Weight:       131.0 lb Date of Birth:  03-11-1951       BSA:          1.671 m Patient Age:    71 years        BP:           119/70 mmHg Patient Gender: F               HR:           102 bpm. [facility] Procedure: 2D Echo, Cardiac Doppler and Color Doppler Indications:    R07.9 (ICD-10-CM) - Chest pain, unspecified type  History:        Patient has prior history of Echocardiogram examinations, most                 recent 05/11/2015. Previous Myocardial Infarction, COPD; Risk                 Factors:Hypertension and Dyslipidemia.  Sonographer:    Celesta Gentile RCS Referring Phys: [id number] VISHNU P MALLIPEDDI IMPRESSIONS  1. Left ventricular ejection fraction, by estimation, is 65 to 70%. The left ventricle has normal function. The left ventricle has no regional wall motion abnormalities. There is mild concentric left ventricular hypertrophy. Left ventricular diastolic parameters are indeterminate.  2. Right ventricular systolic function is normal. The right ventricular size is normal. Tricuspid regurgitation signal is inadequate for assessing PA pressure.  3. The mitral valve is degenerative. Trivial mitral valve regurgitation.  4. The aortic valve is functionally bicuspid. There is moderate calcification of the aortic valve. Aortic valve  regurgitation is mild to moderate, difficult to quantify. Mild aortic valve stenosis. Aortic valve mean gradient measures 9.0 mmHg. Aortic valve Vmax measures 2.40 m/s. Dimentionless index 0.53.  5. The inferior vena cava is normal in size with greater than 50% respiratory variability, suggesting right atrial pressure of 3 mmHg. Comparison(s): Prior images unable to be directly viewed. FINDINGS  Left Ventricle: Left ventricular ejection fraction, by estimation, is 65 to 70%. The left ventricle has normal function. The left ventricle has no regional wall motion abnormalities. The left ventricular internal cavity size was normal  in size. There is  mild concentric left ventricular hypertrophy. Left ventricular diastolic parameters are indeterminate. Right Ventricle: The right ventricular size is normal. No increase in right ventricular wall thickness. Right ventricular systolic function is normal. Tricuspid regurgitation signal is inadequate for assessing PA pressure. Left Atrium: Left atrial size was normal in size. Right Atrium: Right atrial size was normal in size. Pericardium: There is no evidence of pericardial effusion. Mitral Valve: The mitral valve is degenerative in appearance. There is mild thickening of the mitral valve leaflet(s). Trivial mitral valve regurgitation. Tricuspid Valve: The tricuspid valve is grossly normal. Tricuspid valve regurgitation is trivial. Aortic Valve: The aortic valve is bicuspid. There is moderate calcification of the aortic valve. Aortic valve regurgitation is mild to moderate. Aortic regurgitation PHT measures 271 msec. Mild aortic stenosis is present. Aortic valve mean gradient measures 9.0 mmHg. Aortic valve peak gradient measures 23.0 mmHg. Aortic valve area, by VTI measures 1.36 cm. Pulmonic Valve: The pulmonic valve was grossly normal. Pulmonic valve regurgitation is trivial. Aorta: The aortic root is normal in size and structure. Venous: The inferior vena cava is normal in  size with greater than 50% respiratory variability, suggesting right atrial pressure of 3 mmHg. IAS/Shunts: No atrial level shunt detected by color flow Doppler.  LEFT VENTRICLE PLAX 2D LVIDd:         4.00 cm   Diastology LVIDs:         2.30 cm   LV e' medial:    9.79 cm/s LV PW:         1.10 cm   LV E/e' medial:  7.8 LV IVS:        1.00 cm   LV e' lateral:   9.68 cm/s LVOT diam:     1.80 cm   LV E/e' lateral: 7.9 LV SV:         46 LV SV Index:   28 LVOT Area:     2.54 cm  RIGHT VENTRICLE RV S prime:     13.40 cm/s TAPSE (M-mode): 2.1 cm LEFT ATRIUM             Index        RIGHT ATRIUM           Index LA diam:        2.80 cm 1.68 cm/m   RA Area:     12.70 cm LA Vol (A2C):   54.9 ml 32.86 ml/m  RA Volume:   22.10 ml  13.23 ml/m LA Vol (A4C):   28.0 ml 16.76 ml/m LA Biplane Vol: 39.3 ml 23.52 ml/m  AORTIC VALVE AV Area (Vmax):    1.26 cm AV Area (Vmean):   1.49 cm AV Area (VTI):     1.36 cm AV Vmax:           240.00 cm/s AV Vmean:          129.000 cm/s AV VTI:            0.340 m AV Peak Grad:      23.0 mmHg AV Mean Grad:      9.0 mmHg LVOT Vmax:         119.00 cm/s LVOT Vmean:        75.500 cm/s LVOT VTI:          0.182 m LVOT/AV VTI ratio: 0.53 AI PHT:            271 msec  AORTA Ao Root diam: 3.00 cm MITRAL VALVE MV Area (  PHT): 3.65 cm    SHUNTS MV Decel Time: 208 msec    Systemic VTI:  0.18 m MV E velocity: 76.40 cm/s  Systemic Diam: 1.80 cm MV A velocity: 97.10 cm/s MV E/A ratio:  0.79 Nona Dell MD Electronically signed by Nona Dell MD Signature Date/Time: 04/18/2022/2:14:35 PM    Final     Micro Results   Recent Results (from the past 240 hour(s))  Resp panel by RT-PCR (RSV, Flu A&B, Covid) Anterior Nasal Swab     Status: Abnormal   Collection Time: 04/23/22  5:07 AM   Specimen: Anterior Nasal Swab  Result Value Ref Range Status   SARS Coronavirus 2 by RT PCR NEGATIVE NEGATIVE Final    Comment: (NOTE) SARS-CoV-2 target nucleic acids are NOT DETECTED.  The SARS-CoV-2 RNA is  generally detectable in upper respiratory specimens during the acute phase of infection. The lowest concentration of SARS-CoV-2 viral copies this assay can detect is 138 copies/mL. A negative result does not preclude SARS-Cov-2 infection and should not be used as the sole basis for treatment or other patient management decisions. A negative result may occur with  improper specimen collection/handling, submission of specimen other than nasopharyngeal swab, presence of viral mutation(s) within the areas targeted by this assay, and inadequate number of viral copies(<138 copies/mL). A negative result must be combined with clinical observations, patient history, and epidemiological information. The expected result is Negative.  Fact Sheet for Patients:  BloggerCourse.com  Fact Sheet for Healthcare Providers:  SeriousBroker.it  This test is no t yet approved or cleared by the [place] FDA and  has been authorized for detection and/or diagnosis of SARS-CoV-2 by FDA under an Emergency Use Authorization (EUA). This EUA will remain  in effect (meaning this test can be used) for the duration of the COVID-19 declaration under Section 564(b)(1) of the Act, 21 U.S.C.section 360bbb-3(b)(1), unless the authorization is terminated  or revoked sooner.       Influenza A by PCR NEGATIVE NEGATIVE Final   Influenza B by PCR POSITIVE (A) NEGATIVE Final    Comment: (NOTE) The Xpert Xpress SARS-CoV-2/FLU/RSV plus assay is intended as an aid in the diagnosis of influenza from Nasopharyngeal swab specimens and should not be used as a sole basis for treatment. Nasal washings and aspirates are unacceptable for Xpert Xpress SARS-CoV-2/FLU/RSV testing.  Fact Sheet for Patients: BloggerCourse.com  Fact Sheet for Healthcare Providers: SeriousBroker.it  This test is not yet approved or cleared by the  [place] FDA and has been authorized for detection and/or diagnosis of SARS-CoV-2 by FDA under an Emergency Use Authorization (EUA). This EUA will remain in effect (meaning this test can be used) for the duration of the COVID-19 declaration under Section 564(b)(1) of the Act, 21 U.S.C. section 360bbb-3(b)(1), unless the authorization is terminated or revoked.     Resp Syncytial Virus by PCR NEGATIVE NEGATIVE Final    Comment: (NOTE) Fact Sheet for Patients: BloggerCourse.com  Fact Sheet for Healthcare Providers: SeriousBroker.it  This test is not yet approved or cleared by the [place] FDA and has been authorized for detection and/or diagnosis of SARS-CoV-2 by FDA under an Emergency Use Authorization (EUA). This EUA will remain in effect (meaning this test can be used) for the duration of the COVID-19 declaration under Section 564(b)(1) of the Act, 21 U.S.C. section 360bbb-3(b)(1), unless the authorization is terminated or revoked.  Performed at [facility], [address] [place], [place] [address]   MRSA Next Gen by PCR, Nasal  Status: None   Collection Time: 04/23/22  6:00 PM   Specimen: Nasal Mucosa; Nasal Swab  Result Value Ref Range Status   MRSA by PCR Next Gen NOT DETECTED NOT DETECTED Final    Comment: (NOTE) The GeneXpert MRSA Assay (FDA approved for NASAL specimens only), is one component of a comprehensive MRSA colonization surveillance program. It is not intended to diagnose MRSA infection nor to guide or monitor treatment for MRSA infections. Test performance is not FDA approved in patients less than 35 years old. Performed at [facility], [address] [place], [place] [address]     Today   Subjective    Verneda Blagg today has no new complaints  No fever  Or chills   No Nausea, Vomiting or Diarrhea - -Respiratory status improved significantly overall -Oxygen requirement is back to  baseline at 4 L via nasal cannula -Ambulated to bedside commode without significant desaturation            Patient has been seen and examined prior to discharge   Objective   Blood pressure (!) 115/55, pulse (!) 115, temperature 99.5 F (37.5 C), temperature source Oral, resp. rate (!) 40, height 5\' 6"  (1.676 m), weight 51.1 kg, SpO2 (!) 89 %.   Intake/Output Summary (Last 24 hours) at 04/27/2022 1026 Last data filed at 04/27/2022 1000 Gross per 24 hour  Intake 240 ml  Output 600 ml  Net -360 ml    Exam Gen:- Awake Alert, no acute distress , no conversational dyspnea HEENT:- [place].AT, No sclera icterus, left eye ptosis is Not new Nose- [place] 4L/min Neck-Supple Neck,No JVD,.  Lungs-improving air movement, no significant wheezing  CV- S1, S2 normal, regular, right-sided Port-A-Cath in situ no evidence of inflammation/infection noted Abd-  +ve B.Sounds, Abd Soft, No tenderness,    Extremity/Skin:- No  edema,   good pulses Psych-affect is appropriate, oriented x3 Neuro-no new focal deficits, no tremors    Data Review   CBC w Diff:  Lab Results  Component Value Date   WBC 10.7 (H) 04/24/2022   HGB 11.1 (L) 04/24/2022   HGB 12.0 03/26/2022   HGB 14.3 04/09/2017   HCT 35.1 (L) 04/24/2022   HCT 42.7 04/09/2017   PLT 221 04/24/2022   PLT 296 03/26/2022   PLT 321 04/09/2017   LYMPHOPCT 5 04/24/2022   LYMPHOPCT 22.5 04/09/2017   MONOPCT 5 04/24/2022   MONOPCT 7.0 04/09/2017   EOSPCT 0 04/24/2022   EOSPCT 1.3 04/09/2017   BASOPCT 0 04/24/2022   BASOPCT 0.8 04/09/2017    CMP:  Lab Results  Component Value Date   NA 131 (L) 04/27/2022   NA 144 04/09/2017   K 4.5 04/27/2022   K 4.3 04/09/2017   CL 87 (L) 04/27/2022   CO2 33 (H) 04/27/2022   CO2 25 04/09/2017   BUN 23 04/27/2022   BUN 16.8 04/09/2017   CREATININE 0.48 04/27/2022   CREATININE 0.43 (L) 03/26/2022   CREATININE 0.8 04/09/2017   PROT 7.1 04/24/2022   PROT 6.8 04/09/2017   ALBUMIN 3.0 (L) 04/27/2022    ALBUMIN 3.5 04/09/2017   BILITOT 0.2 (L) 04/24/2022   BILITOT 0.2 (L) 03/26/2022   BILITOT 0.22 04/09/2017   ALKPHOS 289 (H) 04/24/2022   ALKPHOS 216 (H) 04/09/2017   AST 30 04/24/2022   AST 24 03/26/2022   AST 12 04/09/2017   ALT 27 04/24/2022   ALT 16 03/26/2022   ALT 13 04/09/2017  .  Total Discharge time is about 33 minutes  Shon Hale M.D on 04/27/2022 at 10:26 AM  Go to [email or web address] -  for contact info  [facility] - Office  [phone]

## 2022-04-29 ENCOUNTER — Telehealth: Payer: Self-pay

## 2022-04-29 NOTE — Telephone Encounter (Signed)
-----  Message from Marjo Bicker, MD sent at 04/25/2022  9:33 AM EST ----- Normal pumping function of the heart.  Mild to moderate aortic valve regurgitation and mild aortic valve stenosis.  Monitor echo every 1 year.  Schedule follow-up visit in the clinic in 6 months.

## 2022-04-29 NOTE — Telephone Encounter (Signed)
Patient notified and verbalized understanding. Patient had no questions or concerns at this time. Pt scheduled for 8m f/u with provider. PCP copied.

## 2022-04-30 ENCOUNTER — Other Ambulatory Visit: Payer: Self-pay

## 2022-05-02 ENCOUNTER — Telehealth: Payer: Self-pay | Admitting: Internal Medicine

## 2022-05-02 NOTE — Telephone Encounter (Signed)
Called patient regarding February and March appointments, contacted patient;'s daughter with upcoming appointments. left a voicemail. Calendar will also be mailed.

## 2022-05-09 ENCOUNTER — Encounter: Payer: Self-pay | Admitting: Internal Medicine

## 2022-05-13 ENCOUNTER — Telehealth: Payer: Self-pay | Admitting: Internal Medicine

## 2022-05-13 NOTE — Telephone Encounter (Signed)
Rescheduled 02/06 appointment time due to provider on-call. Patient has been called and notified of rescheduled appointment time.

## 2022-05-14 ENCOUNTER — Telehealth: Payer: Self-pay | Admitting: Internal Medicine

## 2022-05-14 NOTE — Telephone Encounter (Signed)
Attempted to call patient about upcoming appointments. Patient did not have good connection. Will attempt to call again next day.

## 2022-05-18 ENCOUNTER — Inpatient Hospital Stay (HOSPITAL_COMMUNITY)
Admission: EM | Admit: 2022-05-18 | Discharge: 2022-05-21 | DRG: 377 | Disposition: A | Discharge status: Home / Self Care | Payer: 59 | Attending: Internal Medicine | Admitting: Internal Medicine

## 2022-05-18 ENCOUNTER — Other Ambulatory Visit: Payer: Self-pay

## 2022-05-18 ENCOUNTER — Encounter (HOSPITAL_COMMUNITY): Payer: Self-pay | Admitting: Emergency Medicine

## 2022-05-18 ENCOUNTER — Emergency Department (HOSPITAL_COMMUNITY): Payer: 59

## 2022-05-18 DIAGNOSIS — I252 Old myocardial infarction: Secondary | ICD-10-CM

## 2022-05-18 DIAGNOSIS — D62 Acute posthemorrhagic anemia: Secondary | ICD-10-CM | POA: Diagnosis not present

## 2022-05-18 DIAGNOSIS — Z801 Family history of malignant neoplasm of trachea, bronchus and lung: Secondary | ICD-10-CM | POA: Diagnosis not present

## 2022-05-18 DIAGNOSIS — K5520 Angiodysplasia of colon without hemorrhage: Secondary | ICD-10-CM

## 2022-05-18 DIAGNOSIS — Z681 Body mass index (BMI) 19 or less, adult: Secondary | ICD-10-CM | POA: Diagnosis not present

## 2022-05-18 DIAGNOSIS — J9610 Chronic respiratory failure, unspecified whether with hypoxia or hypercapnia: Secondary | ICD-10-CM | POA: Diagnosis present

## 2022-05-18 DIAGNOSIS — I10 Essential (primary) hypertension: Secondary | ICD-10-CM | POA: Diagnosis present

## 2022-05-18 DIAGNOSIS — K254 Chronic or unspecified gastric ulcer with hemorrhage: Secondary | ICD-10-CM | POA: Diagnosis present

## 2022-05-18 DIAGNOSIS — J9622 Acute and chronic respiratory failure with hypercapnia: Secondary | ICD-10-CM | POA: Diagnosis present

## 2022-05-18 DIAGNOSIS — Z87891 Personal history of nicotine dependence: Secondary | ICD-10-CM

## 2022-05-18 DIAGNOSIS — R918 Other nonspecific abnormal finding of lung field: Secondary | ICD-10-CM | POA: Diagnosis present

## 2022-05-18 DIAGNOSIS — R531 Weakness: Principal | ICD-10-CM

## 2022-05-18 DIAGNOSIS — C3491 Malignant neoplasm of unspecified part of right bronchus or lung: Secondary | ICD-10-CM | POA: Diagnosis not present

## 2022-05-18 DIAGNOSIS — J9621 Acute and chronic respiratory failure with hypoxia: Secondary | ICD-10-CM | POA: Diagnosis present

## 2022-05-18 DIAGNOSIS — Z8041 Family history of malignant neoplasm of ovary: Secondary | ICD-10-CM

## 2022-05-18 DIAGNOSIS — D649 Anemia, unspecified: Secondary | ICD-10-CM

## 2022-05-18 DIAGNOSIS — J441 Chronic obstructive pulmonary disease with (acute) exacerbation: Secondary | ICD-10-CM | POA: Diagnosis present

## 2022-05-18 DIAGNOSIS — Z888 Allergy status to other drugs, medicaments and biological substances status: Secondary | ICD-10-CM | POA: Diagnosis not present

## 2022-05-18 DIAGNOSIS — J9611 Chronic respiratory failure with hypoxia: Secondary | ICD-10-CM | POA: Diagnosis not present

## 2022-05-18 DIAGNOSIS — K259 Gastric ulcer, unspecified as acute or chronic, without hemorrhage or perforation: Secondary | ICD-10-CM | POA: Diagnosis not present

## 2022-05-18 DIAGNOSIS — Z808 Family history of malignant neoplasm of other organs or systems: Secondary | ICD-10-CM

## 2022-05-18 DIAGNOSIS — R627 Adult failure to thrive: Secondary | ICD-10-CM | POA: Diagnosis present

## 2022-05-18 DIAGNOSIS — K3189 Other diseases of stomach and duodenum: Secondary | ICD-10-CM | POA: Diagnosis not present

## 2022-05-18 DIAGNOSIS — F32A Depression, unspecified: Secondary | ICD-10-CM | POA: Diagnosis present

## 2022-05-18 DIAGNOSIS — K31811 Angiodysplasia of stomach and duodenum with bleeding: Secondary | ICD-10-CM | POA: Diagnosis present

## 2022-05-18 DIAGNOSIS — K922 Gastrointestinal hemorrhage, unspecified: Secondary | ICD-10-CM | POA: Diagnosis not present

## 2022-05-18 DIAGNOSIS — R0902 Hypoxemia: Secondary | ICD-10-CM | POA: Diagnosis present

## 2022-05-18 DIAGNOSIS — I2510 Atherosclerotic heart disease of native coronary artery without angina pectoris: Secondary | ICD-10-CM | POA: Diagnosis present

## 2022-05-18 DIAGNOSIS — E43 Unspecified severe protein-calorie malnutrition: Secondary | ICD-10-CM | POA: Diagnosis not present

## 2022-05-18 DIAGNOSIS — Z8042 Family history of malignant neoplasm of prostate: Secondary | ICD-10-CM

## 2022-05-18 DIAGNOSIS — G8929 Other chronic pain: Secondary | ICD-10-CM | POA: Diagnosis present

## 2022-05-18 DIAGNOSIS — Z7952 Long term (current) use of systemic steroids: Secondary | ICD-10-CM

## 2022-05-18 DIAGNOSIS — Z79899 Other long term (current) drug therapy: Secondary | ICD-10-CM

## 2022-05-18 DIAGNOSIS — J189 Pneumonia, unspecified organism: Secondary | ICD-10-CM | POA: Diagnosis not present

## 2022-05-18 DIAGNOSIS — Z923 Personal history of irradiation: Secondary | ICD-10-CM | POA: Diagnosis not present

## 2022-05-18 DIAGNOSIS — J440 Chronic obstructive pulmonary disease with acute lower respiratory infection: Secondary | ICD-10-CM | POA: Diagnosis present

## 2022-05-18 DIAGNOSIS — D4819 Other specified neoplasm of uncertain behavior of connective and other soft tissue: Secondary | ICD-10-CM | POA: Diagnosis present

## 2022-05-18 DIAGNOSIS — K219 Gastro-esophageal reflux disease without esophagitis: Secondary | ICD-10-CM | POA: Diagnosis present

## 2022-05-18 DIAGNOSIS — I493 Ventricular premature depolarization: Secondary | ICD-10-CM | POA: Diagnosis present

## 2022-05-18 DIAGNOSIS — Z95828 Presence of other vascular implants and grafts: Secondary | ICD-10-CM

## 2022-05-18 DIAGNOSIS — K31819 Angiodysplasia of stomach and duodenum without bleeding: Secondary | ICD-10-CM | POA: Diagnosis not present

## 2022-05-18 DIAGNOSIS — Z86011 Personal history of benign neoplasm of the brain: Secondary | ICD-10-CM

## 2022-05-18 DIAGNOSIS — Z9981 Dependence on supplemental oxygen: Secondary | ICD-10-CM

## 2022-05-18 DIAGNOSIS — R0602 Shortness of breath: Secondary | ICD-10-CM

## 2022-05-18 DIAGNOSIS — Z7951 Long term (current) use of inhaled steroids: Secondary | ICD-10-CM

## 2022-05-18 LAB — COMPREHENSIVE METABOLIC PANEL
ALT: 13 U/L (ref 0–44)
AST: 17 U/L (ref 15–41)
Albumin: 2.9 g/dL — ABNORMAL LOW (ref 3.5–5.0)
Alkaline Phosphatase: 218 U/L — ABNORMAL HIGH (ref 38–126)
Anion gap: 8 (ref 5–15)
BUN: 11 mg/dL (ref 8–23)
CO2: 27 mmol/L (ref 22–32)
Calcium: 8.5 mg/dL — ABNORMAL LOW (ref 8.9–10.3)
Chloride: 102 mmol/L (ref 98–111)
Creatinine, Ser: 0.51 mg/dL (ref 0.44–1.00)
GFR, Estimated: >60 mL/min (ref >60)
Glucose, Bld: 108 mg/dL — ABNORMAL HIGH (ref 70–99)
Potassium: 3.9 mmol/L (ref 3.5–5.1)
Sodium: 137 mmol/L (ref 135–145)
Total Bilirubin: 0.3 mg/dL (ref 0.3–1.2)
Total Protein: 6.4 g/dL — ABNORMAL LOW (ref 6.5–8.1)

## 2022-05-18 LAB — BLOOD GAS, VENOUS
Acid-Base Excess: 5.6 mmol/L — ABNORMAL HIGH (ref 0.0–2.0)
Bicarbonate: 34.4 mmol/L — ABNORMAL HIGH (ref 20.0–28.0)
Drawn by: 27160
O2 Saturation: 32.6 %
Patient temperature: 37.2
pCO2, Ven: 71 mmHg (ref 44–60)
pH, Ven: 7.3 (ref 7.25–7.43)
pO2, Ven: <31 mmHg — CL (ref 32–45)

## 2022-05-18 LAB — CBC WITH DIFFERENTIAL/PLATELET
Abs Immature Granulocytes: 0.02 10*3/uL (ref 0.00–0.07)
Basophils Absolute: 0 10*3/uL (ref 0.0–0.1)
Basophils Relative: 0 %
Eosinophils Absolute: 0.1 10*3/uL (ref 0.0–0.5)
Eosinophils Relative: 1 %
HCT: 23.3 % — ABNORMAL LOW (ref 36.0–46.0)
Hemoglobin: 7.5 g/dL — ABNORMAL LOW (ref 12.0–15.0)
Immature Granulocytes: 0 %
Lymphocytes Relative: 10 %
Lymphs Abs: 0.7 10*3/uL (ref 0.7–4.0)
MCH: 25.7 pg — ABNORMAL LOW (ref 26.0–34.0)
MCHC: 32.2 g/dL (ref 30.0–36.0)
MCV: 79.8 fL — ABNORMAL LOW (ref 80.0–100.0)
Monocytes Absolute: 0.4 10*3/uL (ref 0.1–1.0)
Monocytes Relative: 6 %
Neutro Abs: 6.2 10*3/uL (ref 1.7–7.7)
Neutrophils Relative %: 83 %
Platelets: 391 10*3/uL (ref 150–400)
RBC: 2.92 MIL/uL — ABNORMAL LOW (ref 3.87–5.11)
RDW: 17.4 % — ABNORMAL HIGH (ref 11.5–15.5)
WBC: 7.5 10*3/uL (ref 4.0–10.5)
nRBC: 0.4 % — ABNORMAL HIGH (ref 0.0–0.2)

## 2022-05-18 LAB — PROTIME-INR
INR: 1.1 (ref 0.8–1.2)
Prothrombin Time: 13.8 seconds (ref 11.4–15.2)

## 2022-05-18 LAB — PREPARE RBC (CROSSMATCH)

## 2022-05-18 LAB — LIPASE, BLOOD: Lipase: 28 U/L (ref 11–51)

## 2022-05-18 LAB — ABO/RH: ABO/RH(D): O POS

## 2022-05-18 LAB — TROPONIN I (HIGH SENSITIVITY)
Troponin I (High Sensitivity): 8 ng/L (ref <18)
Troponin I (High Sensitivity): 8 ng/L (ref <18)

## 2022-05-18 LAB — LACTIC ACID, PLASMA
Lactic Acid, Venous: 0.9 mmol/L (ref 0.5–1.9)
Lactic Acid, Venous: 1 mmol/L (ref 0.5–1.9)

## 2022-05-18 LAB — TSH: TSH: 1.991 u[IU]/mL (ref 0.350–4.500)

## 2022-05-18 LAB — BRAIN NATRIURETIC PEPTIDE: B Natriuretic Peptide: 197 pg/mL — ABNORMAL HIGH (ref 0.0–100.0)

## 2022-05-18 LAB — MAGNESIUM: Magnesium: 1.8 mg/dL (ref 1.7–2.4)

## 2022-05-18 MED ORDER — SODIUM CHLORIDE 0.9% IV SOLUTION: Freq: Once | INTRAVENOUS | Status: AC

## 2022-05-18 MED ORDER — OXYCODONE HCL 5 MG PO TABS
5.0000 mg | ORAL_TABLET | Freq: Four times a day (QID) | ORAL | Status: DC | PRN
  Administered 2022-05-20 – 2022-05-21 (×3): 5 mg via ORAL
  Filled 2022-05-18 (×3): qty 1

## 2022-05-18 MED ORDER — FLUTICASONE FUROATE-VILANTEROL 200-25 MCG/ACT IN AEPB
1.0000 | INHALATION_SPRAY | Freq: Every day | RESPIRATORY_TRACT | Status: DC
  Administered 2022-05-18 – 2022-05-21 (×4): 1 via RESPIRATORY_TRACT
  Filled 2022-05-18: qty 28

## 2022-05-18 MED ORDER — ENSURE ENLIVE PO LIQD
237.0000 mL | Freq: Three times a day (TID) | ORAL | Status: DC
  Administered 2022-05-18 – 2022-05-21 (×7): 237 mL via ORAL

## 2022-05-18 MED ORDER — DULOXETINE HCL 60 MG PO CPEP
60.0000 mg | ORAL_CAPSULE | Freq: Two times a day (BID) | ORAL | Status: DC
  Administered 2022-05-18 – 2022-05-21 (×6): 60 mg via ORAL
  Filled 2022-05-18: qty 1
  Filled 2022-05-18: qty 2
  Filled 2022-05-18 (×3): qty 1
  Filled 2022-05-18: qty 2

## 2022-05-18 MED ORDER — IPRATROPIUM-ALBUTEROL 0.5-2.5 (3) MG/3ML IN SOLN
3.0000 mL | Freq: Four times a day (QID) | RESPIRATORY_TRACT | Status: DC
  Administered 2022-05-18 – 2022-05-20 (×6): 3 mL via RESPIRATORY_TRACT
  Filled 2022-05-18 (×6): qty 3

## 2022-05-18 MED ORDER — TAMSULOSIN HCL 0.4 MG PO CAPS
0.4000 mg | ORAL_CAPSULE | Freq: Every day | ORAL | Status: DC
  Administered 2022-05-18 – 2022-05-20 (×3): 0.4 mg via ORAL
  Filled 2022-05-18 (×3): qty 1

## 2022-05-18 MED ORDER — LACTATED RINGERS IV SOLN: INTRAVENOUS | Status: DC

## 2022-05-18 MED ORDER — IPRATROPIUM-ALBUTEROL 0.5-2.5 (3) MG/3ML IN SOLN
3.0000 mL | Freq: Once | RESPIRATORY_TRACT | Status: AC
  Administered 2022-05-18: 3 mL via RESPIRATORY_TRACT
  Filled 2022-05-18: qty 3

## 2022-05-18 MED ORDER — BISACODYL 5 MG PO TBEC: 5.0000 mg | DELAYED_RELEASE_TABLET | Freq: Every day | ORAL | Status: DC | PRN

## 2022-05-18 MED ORDER — FENTANYL CITRATE PF 50 MCG/ML IJ SOSY: 12.5000 ug | PREFILLED_SYRINGE | INTRAMUSCULAR | Status: DC | PRN

## 2022-05-18 MED ORDER — SODIUM CHLORIDE 0.9 % IV SOLN
1.0000 g | Freq: Once | INTRAVENOUS | Status: AC
  Administered 2022-05-18: 1 g via INTRAVENOUS
  Filled 2022-05-18: qty 10

## 2022-05-18 MED ORDER — ACETAMINOPHEN 325 MG PO TABS
650.0000 mg | ORAL_TABLET | Freq: Four times a day (QID) | ORAL | Status: DC | PRN
  Administered 2022-05-18 – 2022-05-19 (×3): 650 mg via ORAL
  Filled 2022-05-18 (×3): qty 2

## 2022-05-18 MED ORDER — LACTATED RINGERS IV BOLUS (SEPSIS)
500.0000 mL | Freq: Once | INTRAVENOUS | Status: AC
  Administered 2022-05-18: 500 mL via INTRAVENOUS

## 2022-05-18 MED ORDER — MEGESTROL ACETATE 400 MG/10ML PO SUSP
400.0000 mg | Freq: Every day | ORAL | Status: DC
  Administered 2022-05-20 – 2022-05-21 (×2): 400 mg via ORAL
  Filled 2022-05-18 (×6): qty 10

## 2022-05-18 MED ORDER — ONDANSETRON HCL 4 MG PO TABS: 4.0000 mg | ORAL_TABLET | Freq: Four times a day (QID) | ORAL | Status: DC | PRN

## 2022-05-18 MED ORDER — METHYLPREDNISOLONE SODIUM SUCC 125 MG IJ SOLR
125.0000 mg | Freq: Once | INTRAMUSCULAR | Status: AC
  Administered 2022-05-18: 125 mg via INTRAVENOUS
  Filled 2022-05-18: qty 2

## 2022-05-18 MED ORDER — TOPIRAMATE 25 MG PO TABS
25.0000 mg | ORAL_TABLET | Freq: Two times a day (BID) | ORAL | Status: DC
  Administered 2022-05-18 – 2022-05-20 (×5): 25 mg via ORAL
  Filled 2022-05-18 (×6): qty 1

## 2022-05-18 MED ORDER — ONDANSETRON HCL 4 MG/2ML IJ SOLN
4.0000 mg | Freq: Four times a day (QID) | INTRAMUSCULAR | Status: DC | PRN
  Administered 2022-05-18: 4 mg via INTRAVENOUS
  Filled 2022-05-18: qty 2

## 2022-05-18 MED ORDER — PANTOPRAZOLE SODIUM 40 MG IV SOLR
40.0000 mg | Freq: Two times a day (BID) | INTRAVENOUS | Status: DC
  Administered 2022-05-18 – 2022-05-20 (×5): 40 mg via INTRAVENOUS
  Filled 2022-05-18 (×5): qty 10

## 2022-05-18 MED ORDER — METHYLPREDNISOLONE SODIUM SUCC 40 MG IJ SOLR
40.0000 mg | Freq: Two times a day (BID) | INTRAMUSCULAR | Status: AC
  Administered 2022-05-18 – 2022-05-21 (×6): 40 mg via INTRAVENOUS
  Filled 2022-05-18 (×7): qty 1

## 2022-05-18 MED ORDER — SODIUM CHLORIDE 0.9 % IV SOLN
500.0000 mg | Freq: Once | INTRAVENOUS | Status: AC
  Administered 2022-05-18: 500 mg via INTRAVENOUS
  Filled 2022-05-18: qty 5

## 2022-05-18 MED ORDER — ACETAMINOPHEN 650 MG RE SUPP: 650.0000 mg | Freq: Four times a day (QID) | RECTAL | Status: DC | PRN

## 2022-05-18 MED ORDER — ZOLPIDEM TARTRATE 5 MG PO TABS: 5.0000 mg | ORAL_TABLET | Freq: Every evening | ORAL | Status: DC | PRN

## 2022-05-18 NOTE — H&P (Signed)
History and Physical  [place]  [place] WEX:[id number] DOB: 06-Sep-1950 DOA: 05/18/2022  PCP: [name], FNP  Patient coming from: Home by [facility]  Level of care: Telemetry  I have personally briefly reviewed patient's old medical records in [place]  Chief Complaint: SOB, fatigue   HPI: Courtney Zuniga is a 72 year old female with a history of non-small cell lung cancer of the right lung with recurrence, GERD, depression, COPD, chronic hypoxic respiratory failure requiring 4 L nasal cannula at all times, hypertension, former smoker, anemia, NSTEMI, chronic hip pain,, headache, benign brain tumor, gastric tumor who was discharged last month from [facility] with COPD exacerbation and influenza B infection.    She is reporting that a few days ago she started spontaneously having severe sharp gastric pains and discomfort.  She started drinking milk to try and coat her stomach on the inside.  She also noticed having black stools.  She reports that the pain has subsequently subsided.  She has been having progressive weakness especially with ambulating short distances.  She denies fever and chills.  She has chronic cough symptoms.  Patient denies nausea vomiting and diarrhea.  EMS reported that patient had a desaturation of pulse ox to 76% with ambulation.  They placed her on a nonrebreather and noted patient had labored breathing.  In the ED she was noted to have acute drop in hemoglobin of 11 a month ago down to 7.5.  She was seen to have a black stool that tested Hemoccult positive.  GI was consulted and recommended patient be kept n.p.o. after midnight in anticipation of possible upper endoscopy.  Patient is being transfused 1 unit PRBC.  Her chest x-ray shows findings of bilateral pneumonia as she has been started on antibiotic coverage.  Admission requested for further management.    Past Medical History:  Diagnosis Date   Anemia    as a young woman   Arthritis     osteoartritis   Asthma    Brain tumor (benign) ([place]) 2005 [facility]   Benign   Chronic headaches    Chronic hip pain    Chronic pain    COPD (chronic obstructive pulmonary disease) (HCC)    Coronary artery disease    Depression    Depression 05/15/2016   Encounter for antineoplastic chemotherapy 01/10/2016   GERD (gastroesophageal reflux disease)    Hypertension    Lung cancer ([place]) dx'd 01/2016   currently on chemo and radiation    NSTEMI (non-ST elevated myocardial infarction) ([place]) yrs ago   On home O2    qhs 2 liters at hs and prn   Pneumonia last time 2 yrs ago   Shortness of breath dyspnea    with activity    Past Surgical History:  Procedure Laterality Date   CHOLECYSTECTOMY     COLONOSCOPY  2015   Results requested from [facility]   COLONOSCOPY     ESOPHAGOGASTRODUODENOSCOPY N/A 08/14/2015   Procedure: ESOPHAGOGASTRODUODENOSCOPY (EGD);  Surgeon: Daneil Dolin, MD;  [facility];  Service: Endoscopy;  Laterality: N/A;  215    ESOPHAGOGASTRODUODENOSCOPY (EGD) WITH PROPOFOL N/A 09/13/2015   Procedure: ESOPHAGOGASTRODUODENOSCOPY (EGD) WITH PROPOFOL;  Surgeon: Milus Banister, MD;  Location: [facility];  Service: Endoscopy;  Laterality: N/A;   EUS N/A 03/12/2017   Procedure: UPPER ENDOSCOPIC ULTRASOUND (EUS) RADIAL;  Surgeon: Milus Banister, MD;  Location: [facility];  Service: Endoscopy;  Laterality: N/A;   IR IMAGING GUIDED PORT INSERTION  01/20/2022   TUMOR REMOVAL  2005   Benign   UPPER ESOPHAGEAL ENDOSCOPIC ULTRASOUND (EUS)  09/13/2015   Procedure: UPPER ESOPHAGEAL ENDOSCOPIC ULTRASOUND (EUS);  Surgeon: Milus Banister, MD;  Location: [facility];  Service: Endoscopy;;   VIDEO BRONCHOSCOPY WITH ENDOBRONCHIAL NAVIGATION N/A 12/31/2015   Procedure: VIDEO BRONCHOSCOPY WITH ENDOBRONCHIAL NAVIGATION;  Surgeon: Melrose Nakayama, MD;  Location: [facility];  Service: Thoracic;  Laterality: N/A;   VIDEO BRONCHOSCOPY WITH ENDOBRONCHIAL ULTRASOUND N/A  11/08/2015   Procedure: VIDEO BRONCHOSCOPY WITH ENDOBRONCHIAL ULTRASOUND;  Surgeon: Ivin Poot, MD;  Location: [facility];  Service: Thoracic;  Laterality: N/A;     reports that she quit smoking about 3 years ago. Her smoking use included cigarettes. She has a 38.00 pack-year smoking history. She has never used smokeless tobacco. She reports that she does not drink alcohol and does not use drugs.  Allergies  Allergen Reactions   Desyrel [Trazodone]     "Made me faint"    Family History  Problem Relation Age of Onset   Ovarian cancer Mother    Lung cancer Father    Lung cancer Brother    Lung cancer Brother    Prostate cancer Brother    Brain cancer Sister        Half sister    Prior to Admission medications   Medication Sig Start Date End Date Taking? Authorizing Provider  acetaminophen (TYLENOL) 325 MG tablet Take 2 tablets (650 mg total) by mouth every 6 (six) hours as needed for mild pain, fever or headache. 05/08/18   Denton Brick, Courage, MD  albuterol (VENTOLIN HFA) 108 (90 Base) MCG/ACT inhaler Inhale 2 puffs into the lungs every 4 (four) hours as needed for shortness of breath. 04/26/22   Roxan Hockey, MD  Cholecalciferol (VITAMIN D3) 125 MCG (5000 UT) CAPS Take 1 capsule by mouth daily. Patient not taking: Reported on 04/23/2022 02/17/18   [provider]  cyclobenzaprine (FLEXERIL) 10 MG tablet Take 1 tablet (10 mg total) by mouth 2 (two) times daily. *MAy take one additional tablet as needed for muscle spasms Patient taking differently: Take 10 mg by mouth 2 (two) times daily. *May take one additional tablet as needed for muscle spasms 05/08/18   Roxan Hockey, MD  diclofenac sodium (VOLTAREN) 1 % GEL Apply topically 4 (four) times daily as needed (massge gel into affected area(s) as needed for pain).  Patient not taking: Reported on 04/23/2022 08/11/17   [provider]  DULoxetine (CYMBALTA) 60 MG capsule Take 1 capsule (60 mg total) by mouth 2 (two) times  daily. 04/26/22   Emokpae, Courage, MD  ENSURE (ENSURE) Take 237 mLs by mouth 3 (three) times daily between meals.    [provider]  fluticasone furoate-vilanterol (BREO ELLIPTA) 200-25 MCG/ACT AEPB Inhale 1 puff into the lungs daily. 04/26/22   Roxan Hockey, MD  HYDROcodone bit-homatropine (HYCODAN) 5-1.5 MG/5ML syrup Take 5 mLs by mouth every 6 (six) hours as needed for cough. Patient not taking: Reported on 04/23/2022 02/26/22   Curt Bears, MD  Ipratropium-Albuterol (COMBIVENT RESPIMAT) 20-100 MCG/ACT AERS respimat Inhale 1 puff into the lungs every 6 (six) hours as needed for wheezing or shortness of breath. 04/26/22   Emokpae, Courage, MD  ipratropium-albuterol (DUONEB) 0.5-2.5 (3) MG/3ML SOLN Take 3 mLs by nebulization every 6 (six) hours as needed. 04/26/22   Roxan Hockey, MD  lidocaine-prilocaine (EMLA) cream Apply to the Port-A-Cath site 30-60 minutes before treatment Patient not taking: Reported on 04/23/2022 01/01/22   Julien Nordmann,  Mohamed, MD  megestrol (MEGACE ES) 625 MG/5ML suspension Take 5 mLs (625 mg total) by mouth daily. 01/29/22   Heilingoetter, Cassandra L, PA-C  metoprolol tartrate (LOPRESSOR) 25 MG tablet Take 1 tablet (25 mg total) by mouth 2 (two) times daily. 04/26/22 05/26/22  Roxan Hockey, MD  oseltamivir (TAMIFLU) 30 MG capsule Take 1 capsule (30 mg total) by mouth 2 (two) times daily. 04/26/22   Roxan Hockey, MD  oxyCODONE-acetaminophen (PERCOCET/ROXICET) 5-325 MG tablet Take 1 tablet by mouth every 8 (eight) hours as needed for moderate pain. Patient not taking: Reported on 04/23/2022 05/08/18   Roxan Hockey, MD  pantoprazole (PROTONIX) 40 MG tablet Take 1 tablet (40 mg total) by mouth daily. 04/27/22   Roxan Hockey, MD  predniSONE (DELTASONE) 20 MG tablet Take 1 tablet (20 mg total) by mouth daily with breakfast. Patient taking differently: Take 2.5 mg by mouth daily with breakfast. 11/25/21   Azucena Cecil, PA-C  prochlorperazine  (COMPAZINE) 10 MG tablet Take 1 tablet (10 mg total) by mouth every 6 (six) hours as needed for nausea or vomiting. 04/26/22   Roxan Hockey, MD  tamsulosin (FLOMAX) 0.4 MG CAPS capsule Take 1 capsule (0.4 mg total) by mouth daily after supper. 04/26/22   Roxan Hockey, MD  topiramate (TOPAMAX) 25 MG tablet Take 1 tablet (25 mg total) by mouth 2 (two) times daily. 04/26/22   Emokpae, Courage, MD  TRELEGY ELLIPTA 100-62.5-25 MCG/ACT AEPB Inhale 1 puff into the lungs daily. 04/26/22   Roxan Hockey, MD  Vitamin D, Ergocalciferol, (DRISDOL) 1.25 MG (50000 UNIT) CAPS capsule Take 1 capsule (50,000 Units total) by mouth once a week. 04/26/22   Roxan Hockey, MD  HYDROcodone bit-homatropine (HYCODAN) 5-1.5 MG/5ML syrup Take 5 mLs by mouth every 6 (six) hours as needed for cough. 11/10/21   Curt Bears, MD    Physical Exam: Vitals:   05/18/22 1330 05/18/22 1400 05/18/22 1430 05/18/22 1444  BP: 113/76 122/77 130/72   Pulse: 90 90 88   Resp: (!) 21 (!) 34 (!) 29   Temp:      TempSrc:      SpO2: 99% 97% 94% 100%  Weight:      Height:        Constitutional: extremely frail, cachectic female, cooperative, pleasant Eyes: PERRL, lids and conjunctivae normal ENMT: Mucous membranes are pale and dry. Posterior pharynx clear of any exudate or lesions.  Neck: normal, supple, no masses, no thyromegaly Respiratory: diffuse insp/exp wheezing, no crackles. Tachypnea.   Cardiovascular: normal s1, s2 sounds, no murmurs / rubs / gallops. No extremity edema. 2+ pedal pulses. No carotid bruits.  Abdomen: mild nonspecific tenderness, no masses palpated. No hepatosplenomegaly. Bowel sounds positive.  Musculoskeletal: no clubbing / cyanosis. No joint deformity upper and lower extremities. Good ROM, no contractures. Normal muscle tone.  Skin: no rashes, lesions, ulcers. No induration Neurologic: CN 2-12 grossly intact. Sensation intact, DTR normal. Strength 5/5 in all 4.  Psychiatric: Normal judgment and  insight. Alert and oriented x 3. Normal mood.   Labs on Admission: I have personally reviewed following labs and imaging studies  CBC: Recent Labs  Lab 05/18/22 1305  WBC 7.5  NEUTROABS 6.2  HGB 7.5*  HCT 23.3*  MCV 79.8*  PLT 956   Basic Metabolic Panel: Recent Labs  Lab 05/18/22 1305  NA 137  K 3.9  CL 102  CO2 27  GLUCOSE 108*  BUN 11  CREATININE 0.51  CALCIUM 8.5*  MG 1.8   GFR: Estimated  Creatinine Clearance: 51.7 mL/min (by C-G formula based on SCr of 0.51 mg/dL). Liver Function Tests: Recent Labs  Lab 05/18/22 1305  AST 17  ALT 13  ALKPHOS 218*  BILITOT 0.3  PROT 6.4*  ALBUMIN 2.9*   Recent Labs  Lab 05/18/22 1305  LIPASE 28   No results for input(s): "AMMONIA" in the last 168 hours. Coagulation Profile: Recent Labs  Lab 05/18/22 1305  INR 1.1   Cardiac Enzymes: No results for input(s): "CKTOTAL", "CKMB", "CKMBINDEX", "TROPONINI" in the last 168 hours. BNP (last 3 results) No results for input(s): "PROBNP" in the last 8760 hours. HbA1C: No results for input(s): "HGBA1C" in the last 72 hours. CBG: No results for input(s): "GLUCAP" in the last 168 hours. Lipid Profile: No results for input(s): "CHOL", "HDL", "LDLCALC", "TRIG", "CHOLHDL", "LDLDIRECT" in the last 72 hours. Thyroid Function Tests: Recent Labs    05/18/22 1340  TSH 1.991   Anemia Panel: No results for input(s): "VITAMINB12", "FOLATE", "FERRITIN", "TIBC", "IRON", "RETICCTPCT" in the last 72 hours. Urine analysis:    Component Value Date/Time   COLORURINE YELLOW 05/05/2018 [place] 05/05/2018 1539   LABSPEC 1.006 05/05/2018 1539   PHURINE 6.0 05/05/2018 1539   GLUCOSEU NEGATIVE 05/05/2018 1539   HGBUR NEGATIVE 05/05/2018 1539   BILIRUBINUR NEGATIVE 05/05/2018 1539   KETONESUR NEGATIVE 05/05/2018 1539   PROTEINUR NEGATIVE 05/05/2018 1539   UROBILINOGEN 0.2 03/31/2014 1406   NITRITE NEGATIVE 05/05/2018 1539   LEUKOCYTESUR NEGATIVE 05/05/2018 1539     Radiological Exams on Admission: DG Chest Port 1 View  Result Date: 05/18/2022 CLINICAL DATA:  Weakness, possible sepsis EXAM: PORTABLE CHEST 1 VIEW COMPARISON:  04/23/2022 FINDINGS: Severe emphysema. Chronic right middle lobe scarring. Increased airspace opacity in both lower lobes. Blunting of the right costophrenic angle suggesting small right pleural effusion. Chronically accentuated interstitium in the lungs. Power injectable right Port-A-Cath tip: SVC. Atherosclerotic calcification of the aortic arch. Prominent main pulmonary artery compatible with pulmonary arterial hypertension. Heart size within normal limits. Thoracic spondylosis. Atherosclerotic calcification of the aortic arch. IMPRESSION: 1. Increased airspace opacity in both lower lobes, suspicious for pneumonia or aspiration pneumonitis. 2. Small right pleural effusion. 3. Severe emphysema. 4. Prominent main pulmonary artery compatible with pulmonary arterial hypertension. 5. Chronic scarring and volume loss in the right middle lobe. 6. Thoracic spondylosis. Electronically Signed   By: Van Clines M.D.   On: 05/18/2022 13:45    EKG: Independently reviewed.   Assessment/Plan Principal Problem:   Upper GI bleed Active Problems:   Bilateral pneumonia   Acute blood loss anemia   HTN (hypertension)   Hypoxia   COPD with acute exacerbation (HCC)   Subepithelial gastric mass   Lung mass   Adenocarcinoma of right lung, stage 3 (HCC)   Chronic respiratory failure (HCC)   Acute on chronic respiratory failure with hypoxia and hypercapnia (HCC)   Severe protein-calorie malnutrition (HCC)   GERD (gastroesophageal reflux disease)   Port-A-Cath in place   Frequent PVCs   Acute Upper GI bleed -marked drop in Hg from a month ago -agree with transfusing 1 unit of PRBC -following Hg -IV protonix ordered BID -GI consult with plan for EGD on 2/5 -NPO after midnight   Bilateral Pneumonia  -findings concerning for aspiration   -SLP evaluation and dysphagia diet ordered -continue IV unasyn as ordered -continue supportive measures   Acute on chronic respiratory failure with hypoxia  -pt required nonrebreather -likely exacerbated by ABLA -wean oxygen back down to regular 4L/min  as able  -treating pneumonia and COPD exacerbation   COPD with acute exacerbation  -secondary to pneumonia, resolving recent viral infection  -continue antibiotics, steroids and bronchodilators  -supplemental oxygen with goal pulse ox >87%  Gastric Mass  - I think this is likely cause of spontaneous abdominal pain and GI bleed - EGD per GI service  - IV protonix BID ordered   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 - chemotherapy per oncologist  - needs goals of care discussion   Adult failure to thrive/Severe protein calorie malnutrition   - Goals of care need to be an ongoing discussion - pt seems to be in denial of how serious her condition is - nutritional supplements per dietitian    DVT prophylaxis: SCDs  Code Status: Full   Family Communication: none present during rounds   Disposition Plan: TBD   Consults called: GI service   Admission status: INP  Level of care: Telemetry Irwin Brakeman MD [facility] How to contact the [facility] Attending or Consulting provider [place] or covering provider during after hours 7P -7A, for this patient?  Check the care team in [facility] and look for a) attending/consulting [facility] provider listed and b) the [facility] team listed Log into [email or web address] and use [place]'s universal password to access. If you do not have the password, please contact the hospital operator. Locate the [facility] provider you are looking for under [facility] and page to a number that you can be directly reached. If you still have difficulty reaching the provider, please page the [name] (Director on Call) for the Hospitalists listed on amion for assistance.   If 7PM-7AM, please contact  night-coverage [email or web address] Password [email or web address]  05/18/2022, 3:13 PM

## 2022-05-18 NOTE — Hospital Course (Signed)
72 year old female with a history of non-small cell lung cancer of the right lung with recurrence, GERD, depression, COPD, chronic hypoxic respiratory failure requiring 4 L nasal cannula at all times, hypertension, former smoker, anemia, NSTEMI, chronic hip pain,, headache, benign brain tumor, gastric tumor who was discharged last month from [facility] with COPD exacerbation and influenza B infection.    She is reporting that a few days ago she started spontaneously having severe sharp gastric pains and discomfort.  She started drinking milk to try and coat her stomach on the inside.  She also noticed having black stools.  She reports that the pain has subsequently subsided.  She has been having progressive weakness especially with ambulating short distances.  She denies fever and chills.  She has chronic cough symptoms.  Patient denies nausea vomiting and diarrhea.  EMS reported that patient had a desaturation of pulse ox to 76% with ambulation.  They placed her on a nonrebreather and noted patient had labored breathing.  In the ED she was noted to have acute drop in hemoglobin of 11 a month ago down to 7.5.  She was seen to have a black stool that tested Hemoccult positive.  GI was consulted and recommended patient be kept n.p.o. after midnight in anticipation of possible upper endoscopy.  Patient is being transfused 1 unit PRBC.  Her chest x-ray shows findings of bilateral pneumonia as she has been started on antibiotic coverage.  Admission requested for further management.

## 2022-05-18 NOTE — Progress Notes (Deleted)
[facility] OFFICE PROGRESS NOTE  [name], [place] [address]  DIAGNOSIS: Recurrent non-small cell lung cancer initially diagnosed as stage IIIA (T1b, N2, M0) non-small cell lung cancer presented with right middle lobe pulmonary nodule, mediastinal lymphadenopathy and highly suspicious for small nodule in the left upper lobe that could change her stage to stage IV that could present another synchronous primary lesion in the left upper lobe. This was diagnosed in September 2017.    PRIOR THERAPY: 1) Concurrent chemoradiation with weekly carboplatin for AUC of 2 and paclitaxel 45 MG/M2 status post 6 cycles last dose was given 02/25/2016 with partial response. 2) Consolidation chemotherapy with carboplatin for AUC of 5 and paclitaxel 175 MG/M2 every 3 weeks with Neulasta support. First dose 04/22/2016. Status post 3 cycles. 3) Second line treatment with immunotherapy with Nivolumab 480 mg IV every 4 weeks status post 32 cycles.  Discontinued secondary to intolerance and frequent hospitalization with pneumonia and pneumonitis.  She had a break of treatment between November 24, 2019 until June 28, 2020.  CURRENT THERAPY: Resuming her treatment with immunotherapy with nivolumab 480 mg IV every 4 weeks.  Cycle #33 started on June 28, 2020.  Status post 55 cycles.    INTERVAL HISTORY: Courtney Zuniga 72 y.o. female returns to the clinic today for a follow up visit. In the interval since last being seen, the patient was hospitalized for a COPD exacerbation secondary to influenza. She is feeling *** at this time.  She denies fever, chills, night sweats, or weight loss. She was previously followed by nutrition. She also is on megace which helped her appetite.  The patient does get baseline headache due to having a history of a brain tumor in 2005. She states she took a BC powder today.  She generally will take BC powder. She reports similar dyspnea and cough at her  baseline. She is on 4L/min home oxygen via nasal cannula. She reports for years she has had dyspnea with mild exertion and then will have racing heart and will need to rest. Denies any history of arrhythmia. Denies any fever, chills, night sweats, or weight loss. Denies any chest pain or hemoptysis.  Denies any nausea, vomiting, diarrhea, or constipation. Denies any rashes or skin changes. The patient is here today for evaluation and repeat blood work before starting cycle #56    MEDICAL HISTORY: Past Medical History:  Diagnosis Date   Anemia    as a young woman   Arthritis    osteoartritis   Asthma    Brain tumor (benign) ([employer]) 2005 [facility]   Benign   Chronic headaches    Chronic hip pain    Chronic pain    COPD (chronic obstructive pulmonary disease) (HCC)    Coronary artery disease    Depression    Depression 05/15/2016   Encounter for antineoplastic chemotherapy 01/10/2016   GERD (gastroesophageal reflux disease)    Hypertension    Lung cancer ([facility]) dx'd 01/2016   currently on chemo and radiation    NSTEMI (non-ST elevated myocardial infarction) ([place]) yrs ago   On home O2    qhs 2 liters at hs and prn   Pneumonia last time 2 yrs ago   Shortness of breath dyspnea    with activity    ALLERGIES:  is allergic to desyrel [trazodone].  MEDICATIONS:  Current Outpatient Medications  Medication Sig Dispense Refill   acetaminophen (TYLENOL) 325 MG tablet Take 2 tablets (650 mg total)  by mouth every 6 (six) hours as needed for mild pain, fever or headache. 12 tablet 2   albuterol (VENTOLIN HFA) 108 (90 Base) MCG/ACT inhaler Inhale 2 puffs into the lungs every 4 (four) hours as needed for shortness of breath. 18 g 3   Cholecalciferol (VITAMIN D3) 125 MCG (5000 UT) CAPS Take 1 capsule by mouth daily. (Patient not taking: Reported on 04/23/2022)  0   cyclobenzaprine (FLEXERIL) 10 MG tablet Take 1 tablet (10 mg total) by mouth 2 (two) times daily. *MAy take one additional tablet as needed  for muscle spasms (Patient taking differently: Take 10 mg by mouth 2 (two) times daily. *May take one additional tablet as needed for muscle spasms) 60 tablet 2   diclofenac sodium (VOLTAREN) 1 % GEL Apply topically 4 (four) times daily as needed (massge gel into affected area(s) as needed for pain).  (Patient not taking: Reported on 04/23/2022)  1   DULoxetine (CYMBALTA) 60 MG capsule Take 1 capsule (60 mg total) by mouth 2 (two) times daily. 180 capsule 3   ENSURE (ENSURE) Take 237 mLs by mouth 3 (three) times daily between meals.     fluticasone furoate-vilanterol (BREO ELLIPTA) 200-25 MCG/ACT AEPB Inhale 1 puff into the lungs daily. 60 each 4   HYDROcodone bit-homatropine (HYCODAN) 5-1.5 MG/5ML syrup Take 5 mLs by mouth every 6 (six) hours as needed for cough. (Patient not taking: Reported on 04/23/2022) 120 mL 0   Ipratropium-Albuterol (COMBIVENT RESPIMAT) 20-100 MCG/ACT AERS respimat Inhale 1 puff into the lungs every 6 (six) hours as needed for wheezing or shortness of breath. 4 g 0   ipratropium-albuterol (DUONEB) 0.5-2.5 (3) MG/3ML SOLN Take 3 mLs by nebulization every 6 (six) hours as needed. 360 mL 2   lidocaine-prilocaine (EMLA) cream Apply to the Port-A-Cath site 30-60 minutes before treatment (Patient not taking: Reported on 04/23/2022) 30 g 0   megestrol (MEGACE ES) 625 MG/5ML suspension Take 5 mLs (625 mg total) by mouth daily. 150 mL 0   metoprolol tartrate (LOPRESSOR) 25 MG tablet Take 1 tablet (25 mg total) by mouth 2 (two) times daily. 60 tablet 3   oseltamivir (TAMIFLU) 30 MG capsule Take 1 capsule (30 mg total) by mouth 2 (two) times daily. 6 capsule 0   oxyCODONE-acetaminophen (PERCOCET/ROXICET) 5-325 MG tablet Take 1 tablet by mouth every 8 (eight) hours as needed for moderate pain. (Patient not taking: Reported on 04/23/2022) 12 tablet 0   pantoprazole (PROTONIX) 40 MG tablet Take 1 tablet (40 mg total) by mouth daily. 30 tablet 4   predniSONE (DELTASONE) 20 MG tablet Take 1 tablet  (20 mg total) by mouth daily with breakfast. (Patient taking differently: Take 2.5 mg by mouth daily with breakfast.) 2 tablet 0   prochlorperazine (COMPAZINE) 10 MG tablet Take 1 tablet (10 mg total) by mouth every 6 (six) hours as needed for nausea or vomiting. 30 tablet 0   tamsulosin (FLOMAX) 0.4 MG CAPS capsule Take 1 capsule (0.4 mg total) by mouth daily after supper. 30 capsule 3   topiramate (TOPAMAX) 25 MG tablet Take 1 tablet (25 mg total) by mouth 2 (two) times daily. 45 tablet 2   TRELEGY ELLIPTA 100-62.5-25 MCG/ACT AEPB Inhale 1 puff into the lungs daily. 60 each 3   Vitamin D, Ergocalciferol, (DRISDOL) 1.25 MG (50000 UNIT) CAPS capsule Take 1 capsule (50,000 Units total) by mouth once a week. 5 capsule 3   No current facility-administered medications for this visit.    SURGICAL HISTORY:  Past  Surgical History:  Procedure Laterality Date   CHOLECYSTECTOMY     COLONOSCOPY  2015   Results requested from [facility]   COLONOSCOPY     ESOPHAGOGASTRODUODENOSCOPY N/A 08/14/2015   Procedure: ESOPHAGOGASTRODUODENOSCOPY (EGD);  Surgeon: Daneil Dolin, MD;  Location: [facility];  Service: Endoscopy;  Laterality: N/A;  215    ESOPHAGOGASTRODUODENOSCOPY (EGD) WITH PROPOFOL N/A 09/13/2015   Procedure: ESOPHAGOGASTRODUODENOSCOPY (EGD) WITH PROPOFOL;  Surgeon: Milus Banister, MD;  Location: [facility];  Service: Endoscopy;  Laterality: N/A;   EUS N/A 03/12/2017   Procedure: UPPER ENDOSCOPIC ULTRASOUND (EUS) RADIAL;  Surgeon: Milus Banister, MD;  Location: [facility];  Service: Endoscopy;  Laterality: N/A;   IR IMAGING GUIDED PORT INSERTION  01/20/2022   TUMOR REMOVAL  2005   Benign   UPPER ESOPHAGEAL ENDOSCOPIC ULTRASOUND (EUS)  09/13/2015   Procedure: UPPER ESOPHAGEAL ENDOSCOPIC ULTRASOUND (EUS);  Surgeon: Milus Banister, MD;  Location: [facility];  Service: Endoscopy;;   VIDEO BRONCHOSCOPY WITH ENDOBRONCHIAL NAVIGATION N/A 12/31/2015   Procedure: VIDEO BRONCHOSCOPY WITH  ENDOBRONCHIAL NAVIGATION;  Surgeon: Melrose Nakayama, MD;  Location: [facility];  Service: Thoracic;  Laterality: N/A;   VIDEO BRONCHOSCOPY WITH ENDOBRONCHIAL ULTRASOUND N/A 11/08/2015   Procedure: VIDEO BRONCHOSCOPY WITH ENDOBRONCHIAL ULTRASOUND;  Surgeon: Ivin Poot, MD;  Location: [facility];  Service: Thoracic;  Laterality: N/A;    REVIEW OF SYSTEMS:   Review of Systems  Constitutional: Negative for appetite change, chills, fatigue, fever and unexpected weight change.  HENT:   Negative for mouth sores, nosebleeds, sore throat and trouble swallowing.   Eyes: Negative for eye problems and icterus.  Respiratory: Negative for cough, hemoptysis, shortness of breath and wheezing.   Cardiovascular: Negative for chest pain and leg swelling.  Gastrointestinal: Negative for abdominal pain, constipation, diarrhea, nausea and vomiting.  Genitourinary: Negative for bladder incontinence, difficulty urinating, dysuria, frequency and hematuria.   Musculoskeletal: Negative for back pain, gait problem, neck pain and neck stiffness.  Skin: Negative for itching and rash.  Neurological: Negative for dizziness, extremity weakness, gait problem, headaches, light-headedness and seizures.  Hematological: Negative for adenopathy. Does not bruise/bleed easily.  Psychiatric/Behavioral: Negative for confusion, depression and sleep disturbance. The patient is not nervous/anxious.     PHYSICAL EXAMINATION:  There were no vitals taken for this visit.  ECOG PERFORMANCE STATUS: {CHL ONC ECOG [id number]  Physical Exam  Constitutional: Oriented to person, place, and time and well-developed, well-nourished, and in no distress. No distress.  HENT:  Head: Normocephalic and atraumatic.  Mouth/Throat: Oropharynx is clear and moist. No oropharyngeal exudate.  Eyes: Conjunctivae are normal. Right eye exhibits no discharge. Left eye exhibits no discharge. No scleral icterus.  Neck: Normal range of motion. Neck supple.   Cardiovascular: Normal rate, regular rhythm, normal heart sounds and intact distal pulses.   Pulmonary/Chest: Effort normal and breath sounds normal. No respiratory distress. No wheezes. No rales.  Abdominal: Soft. Bowel sounds are normal. Exhibits no distension and no mass. There is no tenderness.  Musculoskeletal: Normal range of motion. Exhibits no edema.  Lymphadenopathy:    No cervical adenopathy.  Neurological: Alert and oriented to person, place, and time. Exhibits normal muscle tone. Gait normal. Coordination normal.  Skin: Skin is warm and dry. No rash noted. Not diaphoretic. No erythema. No pallor.  Psychiatric: Mood, memory and judgment normal.  Vitals reviewed.  LABORATORY DATA: Lab Results  Component Value Date   WBC 10.7 (H) 04/24/2022   HGB 11.1 (L) 04/24/2022   HCT  35.1 (L) 04/24/2022   MCV 84.8 04/24/2022   PLT 221 04/24/2022      Chemistry      Component Value Date/Time   NA 131 (L) 04/27/2022 0849   NA 144 04/09/2017 0948   K 4.5 04/27/2022 0849   K 4.3 04/09/2017 0948   CL 87 (L) 04/27/2022 0849   CO2 33 (H) 04/27/2022 0849   CO2 25 04/09/2017 0948   BUN 23 04/27/2022 0849   BUN 16.8 04/09/2017 0948   CREATININE 0.48 04/27/2022 0849   CREATININE 0.43 (L) 03/26/2022 1029   CREATININE 0.8 04/09/2017 0948      Component Value Date/Time   CALCIUM 8.4 (L) 04/27/2022 0849   CALCIUM 9.1 04/09/2017 0948   ALKPHOS 289 (H) 04/24/2022 0216   ALKPHOS 216 (H) 04/09/2017 0948   AST 30 04/24/2022 0216   AST 24 03/26/2022 1029   AST 12 04/09/2017 0948   ALT 27 04/24/2022 0216   ALT 16 03/26/2022 1029   ALT 13 04/09/2017 0948   BILITOT 0.2 (L) 04/24/2022 0216   BILITOT 0.2 (L) 03/26/2022 1029   BILITOT 0.22 04/09/2017 0948       RADIOGRAPHIC STUDIES:  LONG TERM MONITOR (3-14 DAYS)  Result Date: 05/06/2022 Event monitor analysis time:6 days and 3 hours Rhythm: Patient had a min HR of 76 bpm, max HR of 171 bpm, and avg HR of 107 bpm. Predominant  underlying rhythm was Sinus Rhythm. AV block/Pauses: None Afib/flutter: None SVT:1 brief run of SVT occurred lasting 5 beats with a max rate of 171 bpm (avg 152 bpm). [id number] PAC:<1% PVC:1.2% Patient events: None Conclusion: Normal sinus rhythm with no evidence of pauses or malignant atrial/ventricular arrhythmias. Elevated heart rates > 90 bpm consistently throughout the day. Average HR is high, 107 bpm. PVC burden is 1.2%.  DG Chest Port 1 View  Result Date: 04/23/2022 CLINICAL DATA:  Shortness of breath. History of COPD and lung cancer. EXAM: PORTABLE CHEST 1 VIEW COMPARISON:  CT chest 01/28/2022 and radiographs 12/14/2021 FINDINGS: Accessed right chest wall Port-A-Cath tip in the low SVC. Stable cardiomediastinal silhouette. Aortic atherosclerotic calcification. Hyperinflation. Chronic bronchitic change and interstitial coarsening. Bibasilar atelectasis/scarring biapical pleural-parenchymal scarring. No focal consolidation, pleural effusion, or pneumothorax. IMPRESSION: No focal pneumonia.  COPD. Electronically Signed   By: Placido Sou M.D.   On: 04/23/2022 03:09   ECHOCARDIOGRAM COMPLETE  Result Date: 04/18/2022    ECHOCARDIOGRAM REPORT   Patient Name:   TEREATHA FATHEREE Date of Exam: 04/18/2022 Medical Rec #:  [id number]       Height:       66.0 in Accession #:    [id number]      Weight:       131.0 lb Date of Birth:  [birth year]       BSA:          1.671 m Patient Age:    46 years        BP:           119/70 mmHg Patient Gender: F               HR:           102 bpm. Exam Location:  [facility] Procedure: 2D Echo, Cardiac Doppler and Color Doppler Indications:    R07.9 (ICD-10-CM) - Chest pain, unspecified type  History:        Patient has prior history of Echocardiogram examinations, most  recent 05/11/2015. Previous Myocardial Infarction, COPD; Risk                 Factors:Hypertension and Dyslipidemia.  Sonographer:    Alvino Chapel RCS Referring Phys: [id number] VISHNU P MALLIPEDDI  IMPRESSIONS  1. Left ventricular ejection fraction, by estimation, is 65 to 70%. The left ventricle has normal function. The left ventricle has no regional wall motion abnormalities. There is mild concentric left ventricular hypertrophy. Left ventricular diastolic parameters are indeterminate.  2. Right ventricular systolic function is normal. The right ventricular size is normal. Tricuspid regurgitation signal is inadequate for assessing PA pressure.  3. The mitral valve is degenerative. Trivial mitral valve regurgitation.  4. The aortic valve is functionally bicuspid. There is moderate calcification of the aortic valve. Aortic valve regurgitation is mild to moderate, difficult to quantify. Mild aortic valve stenosis. Aortic valve mean gradient measures 9.0 mmHg. Aortic valve Vmax measures 2.40 m/s. Dimentionless index 0.53.  5. The inferior vena cava is normal in size with greater than 50% respiratory variability, suggesting right atrial pressure of 3 mmHg. Comparison(s): Prior images unable to be directly viewed. FINDINGS  Left Ventricle: Left ventricular ejection fraction, by estimation, is 65 to 70%. The left ventricle has normal function. The left ventricle has no regional wall motion abnormalities. The left ventricular internal cavity size was normal in size. There is  mild concentric left ventricular hypertrophy. Left ventricular diastolic parameters are indeterminate. Right Ventricle: The right ventricular size is normal. No increase in right ventricular wall thickness. Right ventricular systolic function is normal. Tricuspid regurgitation signal is inadequate for assessing PA pressure. Left Atrium: Left atrial size was normal in size. Right Atrium: Right atrial size was normal in size. Pericardium: There is no evidence of pericardial effusion. Mitral Valve: The mitral valve is degenerative in appearance. There is mild thickening of the mitral valve leaflet(s). Trivial mitral valve regurgitation. Tricuspid  Valve: The tricuspid valve is grossly normal. Tricuspid valve regurgitation is trivial. Aortic Valve: The aortic valve is bicuspid. There is moderate calcification of the aortic valve. Aortic valve regurgitation is mild to moderate. Aortic regurgitation PHT measures 271 msec. Mild aortic stenosis is present. Aortic valve mean gradient measures 9.0 mmHg. Aortic valve peak gradient measures 23.0 mmHg. Aortic valve area, by VTI measures 1.36 cm. Pulmonic Valve: The pulmonic valve was grossly normal. Pulmonic valve regurgitation is trivial. Aorta: The aortic root is normal in size and structure. Venous: The inferior vena cava is normal in size with greater than 50% respiratory variability, suggesting right atrial pressure of 3 mmHg. IAS/Shunts: No atrial level shunt detected by color flow Doppler.  LEFT VENTRICLE PLAX 2D LVIDd:         4.00 cm   Diastology LVIDs:         2.30 cm   LV e' medial:    9.79 cm/s LV PW:         1.10 cm   LV E/e' medial:  7.8 LV IVS:        1.00 cm   LV e' lateral:   9.68 cm/s LVOT diam:     1.80 cm   LV E/e' lateral: 7.9 LV SV:         46 LV SV Index:   28 LVOT Area:     2.54 cm  RIGHT VENTRICLE RV S prime:     13.40 cm/s TAPSE (M-mode): 2.1 cm LEFT ATRIUM             Index  RIGHT ATRIUM           Index LA diam:        2.80 cm 1.68 cm/m   RA Area:     12.70 cm LA Vol (A2C):   54.9 ml 32.86 ml/m  RA Volume:   22.10 ml  13.23 ml/m LA Vol (A4C):   28.0 ml 16.76 ml/m LA Biplane Vol: 39.3 ml 23.52 ml/m  AORTIC VALVE AV Area (Vmax):    1.26 cm AV Area (Vmean):   1.49 cm AV Area (VTI):     1.36 cm AV Vmax:           240.00 cm/s AV Vmean:          129.000 cm/s AV VTI:            0.340 m AV Peak Grad:      23.0 mmHg AV Mean Grad:      9.0 mmHg LVOT Vmax:         119.00 cm/s LVOT Vmean:        75.500 cm/s LVOT VTI:          0.182 m LVOT/AV VTI ratio: 0.53 AI PHT:            271 msec  AORTA Ao Root diam: 3.00 cm MITRAL VALVE MV Area (PHT): 3.65 cm    SHUNTS MV Decel Time: 208 msec     Systemic VTI:  0.18 m MV E velocity: 76.40 cm/s  Systemic Diam: 1.80 cm MV A velocity: 97.10 cm/s MV E/A ratio:  0.79 Rozann Lesches MD Electronically signed by Rozann Lesches MD Signature Date/Time: 04/18/2022/2:14:35 PM    Final      ASSESSMENT/PLAN:  This is a very pleasant 72 year old African-American female with recurrent metastatic non-small cell lung cancer, adenocarcinoma of the right middle lobe.  She was initially diagnosed as stage IIIa in September 2017.     She completed concurrent chemoradiation followed by consolidation with carboplatin and paclitaxel. She is status post 3 cycles. The patient was on observation but a restaging CT scan showed evidence of disease progression.   She is currently undergoing treatment with second line with immunotherapy with nivolumab 480 mg IV every 4 weeks.  She was status post 31 cycles. Her treatment had been on hold since August 2021 until her COPD can get under control. She showed evidence of some recurrent disease in March 2022 and she resumed her treatment with cycle number cycle #33 of single agent nivolumab 480 mg IV every 4 weeks in March 2022 status post 55 cycles.  The patient was recently hospitalized for COPD exacerbation secondary to influenza. She is feeling *** at this time.   The patient was seen with Dr. Julien Nordmann. Labs were reviewed. Recommend that she proceed with cycle #56 today as scheduled.   I will arrange for a repeat CT scan of the chest, abdomen, and pelvis before her next cycle of treatment to restage her disease. The patient previously mentioned that the IV contrast for her last CT scan "burned"/hurt.  Discussed that for her next restaging CT scan that we can make her an appointment here at [facility] to access her port to use for the IV contrast in the future.    She will continue taking megace.   The patient was advised to call immediately if she has any concerning symptoms in the interval. The patient voices understanding of  current disease status and treatment options and is in agreement with the current care plan. All  questions were answered. The patient knows to call the clinic with any problems, questions or concerns. We can certainly see the patient much sooner if necessary       No orders of the defined types were placed in this encounter.    I spent {CHL ONC TIME VISIT - [id number] counseling the patient face to face. The total time spent in the appointment was {CHL ONC TIME VISIT - [id number].  Mashawn Brazil L Tinia Oravec, PA-C 05/18/22

## 2022-05-18 NOTE — Progress Notes (Signed)
Alert and oriented x 4.  Blood infusing with no adverse reaction  Bladder scan showed 169.  Received tylenol for headache and zofran for nausea but has had ensure and some ice cream and has not been sick.  Scd's applied.

## 2022-05-18 NOTE — ED Provider Notes (Signed)
[facility] Provider Note   CSN: [id number] Arrival date & time: 05/18/22  1227     History  Chief Complaint  Patient presents with   Fatigue    Courtney Zuniga is a 72 y.o. female.  HPI Patient presents for fatigue and exertional shortness of breath.  Medical history includes GERD, asthma, COPD, stage III right lung adenocarcinoma, arthritis, anemia, chronic pain.  She was hospitalized 3 weeks ago for COPD exacerbation in the setting of influenza B infection.  She arrives from home by EMS for generalized weakness and exertional shortness of breath.  This has been worsening over the past 3 days.  She describes dyspnea with even minimal movements.  Her son, who she lives with encouraged her to come in earlier.  She refused until today due to the worsening.  She does report that she has had dark stools lately.  She has been able to eat and drink normally.  Last bowel movement was yesterday.  She had some previous generalized abdominal pain which has resolved.  She currently denies any areas of discomfort.  At baseline, she was 4 L of supplemental oxygen.  EMS reports that SpO2 dropped to 76% during ambulation.  She was placed on nonrebreather and weaned down back to 4 L at rest.     Home Medications Prior to Admission medications   Medication Sig Start Date End Date Taking? Authorizing Provider  acetaminophen (TYLENOL) 325 MG tablet Take 2 tablets (650 mg total) by mouth every 6 (six) hours as needed for mild pain, fever or headache. 05/08/18   Denton Brick, Courage, MD  albuterol (VENTOLIN HFA) 108 (90 Base) MCG/ACT inhaler Inhale 2 puffs into the lungs every 4 (four) hours as needed for shortness of breath. 04/26/22   Roxan Hockey, MD  Cholecalciferol (VITAMIN D3) 125 MCG (5000 UT) CAPS Take 1 capsule by mouth daily. Patient not taking: Reported on 04/23/2022 02/17/18   [provider]  cyclobenzaprine (FLEXERIL) 10 MG tablet Take 1 tablet  (10 mg total) by mouth 2 (two) times daily. *MAy take one additional tablet as needed for muscle spasms Patient taking differently: Take 10 mg by mouth 2 (two) times daily. *May take one additional tablet as needed for muscle spasms 05/08/18   Roxan Hockey, MD  diclofenac sodium (VOLTAREN) 1 % GEL Apply topically 4 (four) times daily as needed (massge gel into affected area(s) as needed for pain).  Patient not taking: Reported on 04/23/2022 08/11/17   [provider]  DULoxetine (CYMBALTA) 60 MG capsule Take 1 capsule (60 mg total) by mouth 2 (two) times daily. 04/26/22   Emokpae, Courage, MD  ENSURE (ENSURE) Take 237 mLs by mouth 3 (three) times daily between meals.    [provider]  fluticasone furoate-vilanterol (BREO ELLIPTA) 200-25 MCG/ACT AEPB Inhale 1 puff into the lungs daily. 04/26/22   Roxan Hockey, MD  HYDROcodone bit-homatropine (HYCODAN) 5-1.5 MG/5ML syrup Take 5 mLs by mouth every 6 (six) hours as needed for cough. Patient not taking: Reported on 04/23/2022 02/26/22   Curt Bears, MD  Ipratropium-Albuterol (COMBIVENT RESPIMAT) 20-100 MCG/ACT AERS respimat Inhale 1 puff into the lungs every 6 (six) hours as needed for wheezing or shortness of breath. 04/26/22   Emokpae, Courage, MD  ipratropium-albuterol (DUONEB) 0.5-2.5 (3) MG/3ML SOLN Take 3 mLs by nebulization every 6 (six) hours as needed. 04/26/22   Roxan Hockey, MD  lidocaine-prilocaine (EMLA) cream Apply to the Port-A-Cath site 30-60 minutes before treatment Patient not taking:  Reported on 04/23/2022 01/01/22   Curt Bears, MD  megestrol (MEGACE ES) 625 MG/5ML suspension Take 5 mLs (625 mg total) by mouth daily. 01/29/22   Heilingoetter, Cassandra L, PA-C  metoprolol tartrate (LOPRESSOR) 25 MG tablet Take 1 tablet (25 mg total) by mouth 2 (two) times daily. 04/26/22 05/26/22  Roxan Hockey, MD  oseltamivir (TAMIFLU) 30 MG capsule Take 1 capsule (30 mg total) by mouth 2 (two) times daily. 04/26/22    Roxan Hockey, MD  oxyCODONE-acetaminophen (PERCOCET/ROXICET) 5-325 MG tablet Take 1 tablet by mouth every 8 (eight) hours as needed for moderate pain. Patient not taking: Reported on 04/23/2022 05/08/18   Roxan Hockey, MD  pantoprazole (PROTONIX) 40 MG tablet Take 1 tablet (40 mg total) by mouth daily. 04/27/22   Roxan Hockey, MD  predniSONE (DELTASONE) 20 MG tablet Take 1 tablet (20 mg total) by mouth daily with breakfast. Patient taking differently: Take 2.5 mg by mouth daily with breakfast. 11/25/21   Azucena Cecil, PA-C  prochlorperazine (COMPAZINE) 10 MG tablet Take 1 tablet (10 mg total) by mouth every 6 (six) hours as needed for nausea or vomiting. 04/26/22   Roxan Hockey, MD  tamsulosin (FLOMAX) 0.4 MG CAPS capsule Take 1 capsule (0.4 mg total) by mouth daily after supper. 04/26/22   Roxan Hockey, MD  topiramate (TOPAMAX) 25 MG tablet Take 1 tablet (25 mg total) by mouth 2 (two) times daily. 04/26/22   Emokpae, Courage, MD  TRELEGY ELLIPTA 100-62.5-25 MCG/ACT AEPB Inhale 1 puff into the lungs daily. 04/26/22   Roxan Hockey, MD  Vitamin D, Ergocalciferol, (DRISDOL) 1.25 MG (50000 UNIT) CAPS capsule Take 1 capsule (50,000 Units total) by mouth once a week. 04/26/22   Roxan Hockey, MD  HYDROcodone bit-homatropine (HYCODAN) 5-1.5 MG/5ML syrup Take 5 mLs by mouth every 6 (six) hours as needed for cough. 11/10/21   Curt Bears, MD      Allergies    Desyrel [trazodone]    Review of Systems   Review of Systems  Constitutional:  Positive for fatigue.  Respiratory:  Positive for shortness of breath (Exertional).   Neurological:  Positive for weakness (Generalized).  All other systems reviewed and are negative.   Physical Exam Updated Vital Signs BP (!) 117/91   Pulse 88   Temp 99 F (37.2 C) (Oral)   Resp (!) 27   Ht 5\' 7"  (1.702 m)   Wt 51.5 kg   SpO2 95%   BMI 17.78 kg/m  Physical Exam Vitals and nursing note reviewed.  Constitutional:       General: She is not in acute distress.    Appearance: Normal appearance. She is well-developed. She is ill-appearing (Chronically). She is not toxic-appearing or diaphoretic.  HENT:     Head: Normocephalic and atraumatic.     Right Ear: External ear normal.     Left Ear: External ear normal.     Nose: Nose normal.     Mouth/Throat:     Mouth: Mucous membranes are moist.  Eyes:     Extraocular Movements: Extraocular movements intact.     Conjunctiva/sclera: Conjunctivae normal.  Cardiovascular:     Rate and Rhythm: Normal rate and regular rhythm.     Heart sounds: No murmur heard. Pulmonary:     Effort: Pulmonary effort is normal. Tachypnea present. No respiratory distress.     Breath sounds: Decreased air movement present. No transmitted upper airway sounds. Decreased breath sounds present. No wheezing, rhonchi or rales.  Abdominal:     General:  There is no distension.     Palpations: Abdomen is soft.     Tenderness: There is no abdominal tenderness.  Genitourinary:    Rectum: Normal. Guaiac result positive. No external hemorrhoid or internal hemorrhoid.  Musculoskeletal:        General: No swelling. Normal range of motion.     Cervical back: Normal range of motion and neck supple.     Right lower leg: No edema.     Left lower leg: No edema.  Skin:    General: Skin is warm and dry.     Capillary Refill: Capillary refill takes less than 2 seconds.     Coloration: Skin is not jaundiced or pale.  Neurological:     General: No focal deficit present.     Mental Status: She is alert and oriented to person, place, and time.     Cranial Nerves: No cranial nerve deficit.     Sensory: No sensory deficit.     Motor: No weakness.     Coordination: Coordination normal.  Psychiatric:        Mood and Affect: Mood normal.        Behavior: Behavior normal.        Judgment: Judgment normal.     ED Results / Procedures / Treatments   Labs (all labs ordered are listed, but only abnormal  results are displayed) Labs Reviewed  COMPREHENSIVE METABOLIC PANEL - Abnormal; Notable for the following components:      Result Value   Glucose, Bld 108 (*)    Calcium 8.5 (*)    Total Protein 6.4 (*)    Albumin 2.9 (*)    Alkaline Phosphatase 218 (*)    All other components within normal limits  CBC WITH DIFFERENTIAL/PLATELET - Abnormal; Notable for the following components:   RBC 2.92 (*)    Hemoglobin 7.5 (*)    HCT 23.3 (*)    MCV 79.8 (*)    MCH 25.7 (*)    RDW 17.4 (*)    nRBC 0.4 (*)    All other components within normal limits  BRAIN NATRIURETIC PEPTIDE - Abnormal; Notable for the following components:   B Natriuretic Peptide 197.0 (*)    All other components within normal limits  BLOOD GAS, VENOUS - Abnormal; Notable for the following components:   pCO2, Ven 71 (*)    pO2, Ven <31 (*)    Bicarbonate 34.4 (*)    Acid-Base Excess 5.6 (*)    All other components within normal limits  CULTURE, BLOOD (ROUTINE X 2)  CULTURE, BLOOD (ROUTINE X 2)  LACTIC ACID, PLASMA  PROTIME-INR  LIPASE, BLOOD  MAGNESIUM  TSH  LACTIC ACID, PLASMA  URINALYSIS, W/ REFLEX TO CULTURE (INFECTION SUSPECTED)  POC OCCULT BLOOD, ED  TYPE AND SCREEN  PREPARE RBC (CROSSMATCH)  ABO/RH  TROPONIN I (HIGH SENSITIVITY)  TROPONIN I (HIGH SENSITIVITY)    EKG None  Radiology DG Chest Port 1 View  Result Date: 05/18/2022 CLINICAL DATA:  Weakness, possible sepsis EXAM: PORTABLE CHEST 1 VIEW COMPARISON:  04/23/2022 FINDINGS: Severe emphysema. Chronic right middle lobe scarring. Increased airspace opacity in both lower lobes. Blunting of the right costophrenic angle suggesting small right pleural effusion. Chronically accentuated interstitium in the lungs. Power injectable right Port-A-Cath tip: SVC. Atherosclerotic calcification of the aortic arch. Prominent main pulmonary artery compatible with pulmonary arterial hypertension. Heart size within normal limits. Thoracic spondylosis. Atherosclerotic  calcification of the aortic arch. IMPRESSION: 1. Increased airspace opacity in both lower  lobes, suspicious for pneumonia or aspiration pneumonitis. 2. Small right pleural effusion. 3. Severe emphysema. 4. Prominent main pulmonary artery compatible with pulmonary arterial hypertension. 5. Chronic scarring and volume loss in the right middle lobe. 6. Thoracic spondylosis. Electronically Signed   By: Van Clines M.D.   On: 05/18/2022 13:45    Procedures Procedures    Medications Ordered in ED Medications  0.9 %  sodium chloride infusion (Manually program via Guardrails IV Fluids) (has no administration in time range)  pantoprazole (PROTONIX) injection 40 mg (40 mg Intravenous Given 05/18/22 1452)  cefTRIAXone (ROCEPHIN) 1 g in sodium chloride 0.9 % 100 mL IVPB (1 g Intravenous New Bag/Given 05/18/22 1500)  azithromycin (ZITHROMAX) 500 mg in sodium chloride 0.9 % 250 mL IVPB (has no administration in time range)  DULoxetine (CYMBALTA) DR capsule 60 mg (has no administration in time range)  megestrol (MEGACE ES) 625 MG/5ML suspension 625 mg (has no administration in time range)  topiramate (TOPAMAX) tablet 25 mg (has no administration in time range)  tamsulosin (FLOMAX) capsule 0.4 mg (has no administration in time range)  fluticasone furoate-vilanterol (BREO ELLIPTA) 200-25 MCG/ACT 1 puff (has no administration in time range)  ipratropium-albuterol (DUONEB) 0.5-2.5 (3) MG/3ML nebulizer solution 3 mL (has no administration in time range)  lactated ringers infusion (has no administration in time range)  acetaminophen (TYLENOL) tablet 650 mg (has no administration in time range)    Or  acetaminophen (TYLENOL) suppository 650 mg (has no administration in time range)  oxyCODONE (Oxy IR/ROXICODONE) immediate release tablet 5 mg (has no administration in time range)  fentaNYL (SUBLIMAZE) injection 12.5 mcg (has no administration in time range)  zolpidem (AMBIEN) tablet 5 mg (has no administration in  time range)  bisacodyl (DULCOLAX) EC tablet 5 mg (has no administration in time range)  ondansetron (ZOFRAN) tablet 4 mg (has no administration in time range)    Or  ondansetron (ZOFRAN) injection 4 mg (has no administration in time range)  methylPREDNISolone sodium succinate (SOLU-MEDROL) 40 mg/mL injection 40 mg (has no administration in time range)  lactated ringers bolus 500 mL (500 mLs Intravenous New Bag/Given 05/18/22 1337)  ipratropium-albuterol (DUONEB) 0.5-2.5 (3) MG/3ML nebulizer solution 3 mL (3 mLs Nebulization Given 05/18/22 1444)  methylPREDNISolone sodium succinate (SOLU-MEDROL) 125 mg/2 mL injection 125 mg (125 mg Intravenous Given 05/18/22 1449)    ED Course/ Medical Decision Making/ A&P                             Medical Decision Making Amount and/or Complexity of Data Reviewed Labs: ordered. Radiology: ordered. ECG/medicine tests: ordered.  Risk Prescription drug management. Decision regarding hospitalization.   This patient presents to the ED for concern of fatigue and shortness of breath, this involves an extensive number of treatment options, and is a complaint that carries with it a high risk of complications and morbidity.  The differential diagnosis includes COPD exacerbation, pneumonia, worsening of cancer, anemia, dehydration, metabolic derangements   Co morbidities that complicate the patient evaluation   GERD, asthma, COPD, stage III right lung adenocarcinoma, arthritis, anemia, chronic pain   Additional history obtained:  Additional history obtained from N/A External records from outside source obtained and reviewed including EMR   Lab Tests:  I Ordered, and personally interpreted labs.  The pertinent results include: Patient has had a 3.6 grams drop deciliter in hemoglobin since 1 month ago.  No leukocytosis is present.  Kidney function, electrolytes and troponin  are normal.  BNP is slightly elevated.  She has a mild respiratory acidosis on blood  gas.   Imaging Studies ordered:  I ordered imaging studies including chest x-ray I independently visualized and interpreted imaging which showed basilar opacities concerning for pneumonia and/or aspiration, small right pleural effusion I agree with the radiologist interpretation   Cardiac Monitoring: / EKG:  The patient was maintained on a cardiac monitor.  I personally viewed and interpreted the cardiac monitored which showed an underlying rhythm of: Sinus rhythm   Consultations Obtained:  I requested consultation with the gastroenterologist, Dr. Jenetta Downer,  and discussed lab and imaging findings as well as pertinent plan - they recommend: PPI, NPO at midnight for likely scope tomorrow   Problem List / ED Course / Critical interventions / Medication management  Patient presents with 3 days of worsening fatigue, generalized weakness, and exertional shortness of breath.  She has a history of COPD and lung cancer.  She currently uses 4 L of oxygen by nasal cannula throughout the day.  EMS noted desaturations with ambulation.  At rest, on her 4 L, SpO2 is normal.  Patient arrives alert and oriented.  She states that she had some previous abdominal pain but this has resolved.  Currently, her abdomen is soft and nontender.  She has diminished air movement on lung auscultation.  She states that she has had recent dark stools.  DRE was performed with nurse chaperone present.  A very small amount of dark stool was present on glove and this was Hemoccult positive.  On her lab work, patient has had a 3.6 g/dL drop in hemoglobin over the past month.  I suspect upper GI bleed given her recent melena.  Patient was consented for blood transfusion.  PRBCs were ordered.  Protonix was ordered. COPD was treated with solumedrol and duoneb. CXR shows concern for bibaasilar pneumonia. Antibiotics were ordered.  I spoke with gastroenterologist on-call, Dr. Jenetta Downer, who will plan on EGD tomorrow.  Patient was  admitted for further management. I ordered medication including PRBCs for symptomatic anemia; Solu-Medrol and DuoNeb for COPD; antibiotics for pneumonia; IV fluids for hydration; Protonix for empiric treatment of UGIB Reevaluation of the patient after these medicines showed that the patient improved I have reviewed the patients home medicines and have made adjustments as needed   Social Determinants of Health:  Lives at home with family  CRITICAL CARE Performed by: Godfrey Pick   Total critical care time: 35 minutes  Critical care time was exclusive of separately billable procedures and treating other patients.  Critical care was necessary to treat or prevent imminent or life-threatening deterioration.  Critical care was time spent personally by me on the following activities: development of treatment plan with patient and/or surrogate as well as nursing, discussions with consultants, evaluation of patient's response to treatment, examination of patient, obtaining history from patient or surrogate, ordering and performing treatments and interventions, ordering and review of laboratory studies, ordering and review of radiographic studies, pulse oximetry and re-evaluation of patient's condition.         Final Clinical Impression(s) / ED Diagnoses Final diagnoses:  Generalized weakness  Exertional shortness of breath  Symptomatic anemia  COPD exacerbation (HCC)  Pneumonia of both lower lobes due to infectious organism    Rx / DC Orders ED Discharge Orders     None         Godfrey Pick, MD 05/18/22 1520

## 2022-05-18 NOTE — ED Triage Notes (Signed)
Patient brought in via EMS from home. Alert and oriented. Airway patent. Paramedic called out for generalized weakness x1 week that is progressively getting worse. Denies any fever, nausea, vomiting, or diarrhea. Per paramedic patient has lung cancer and wears 4L of O2 via [place] at all times. Patient O2 sat 91-92% upon paramedics arrival. Patient dropped to 76% with ambulation. Patient put on Non-rebreather briefly to help easy labored breathing. Patient now back on 4L and is 94%.

## 2022-05-19 ENCOUNTER — Inpatient Hospital Stay (HOSPITAL_COMMUNITY): Payer: 59 | Admitting: Anesthesiology

## 2022-05-19 ENCOUNTER — Encounter (HOSPITAL_COMMUNITY): Payer: Self-pay | Admitting: Family Medicine

## 2022-05-19 ENCOUNTER — Encounter (HOSPITAL_COMMUNITY)
Admission: EM | Disposition: A | Discharge status: Home / Self Care | Payer: Self-pay | Source: Home / Self Care | Attending: Family Medicine

## 2022-05-19 DIAGNOSIS — J189 Pneumonia, unspecified organism: Secondary | ICD-10-CM | POA: Diagnosis not present

## 2022-05-19 DIAGNOSIS — K3189 Other diseases of stomach and duodenum: Secondary | ICD-10-CM | POA: Diagnosis not present

## 2022-05-19 DIAGNOSIS — K31819 Angiodysplasia of stomach and duodenum without bleeding: Secondary | ICD-10-CM

## 2022-05-19 DIAGNOSIS — D62 Acute posthemorrhagic anemia: Secondary | ICD-10-CM | POA: Diagnosis not present

## 2022-05-19 DIAGNOSIS — E43 Unspecified severe protein-calorie malnutrition: Secondary | ICD-10-CM

## 2022-05-19 DIAGNOSIS — K259 Gastric ulcer, unspecified as acute or chronic, without hemorrhage or perforation: Secondary | ICD-10-CM

## 2022-05-19 DIAGNOSIS — K922 Gastrointestinal hemorrhage, unspecified: Secondary | ICD-10-CM | POA: Diagnosis not present

## 2022-05-19 DIAGNOSIS — K31811 Angiodysplasia of stomach and duodenum with bleeding: Principal | ICD-10-CM

## 2022-05-19 DIAGNOSIS — I252 Old myocardial infarction: Secondary | ICD-10-CM

## 2022-05-19 DIAGNOSIS — K219 Gastro-esophageal reflux disease without esophagitis: Secondary | ICD-10-CM

## 2022-05-19 HISTORY — PX: ESOPHAGOGASTRODUODENOSCOPY (EGD) WITH PROPOFOL: SHX5813

## 2022-05-19 HISTORY — PX: HOT HEMOSTASIS: SHX5433

## 2022-05-19 LAB — CBC
HCT: 27.6 % — ABNORMAL LOW (ref 36.0–46.0)
Hemoglobin: 8.7 g/dL — ABNORMAL LOW (ref 12.0–15.0)
MCH: 25.8 pg — ABNORMAL LOW (ref 26.0–34.0)
MCHC: 31.5 g/dL (ref 30.0–36.0)
MCV: 81.9 fL (ref 80.0–100.0)
Platelets: 354 10*3/uL (ref 150–400)
RBC: 3.37 MIL/uL — ABNORMAL LOW (ref 3.87–5.11)
RDW: 17 % — ABNORMAL HIGH (ref 11.5–15.5)
WBC: 5.6 10*3/uL (ref 4.0–10.5)
nRBC: 0.5 % — ABNORMAL HIGH (ref 0.0–0.2)

## 2022-05-19 LAB — URINALYSIS, W/ REFLEX TO CULTURE (INFECTION SUSPECTED)
Bacteria, UA: NONE SEEN
Bilirubin Urine: NEGATIVE
Glucose, UA: NEGATIVE mg/dL
Hgb urine dipstick: NEGATIVE
Ketones, ur: NEGATIVE mg/dL
Leukocytes,Ua: NEGATIVE
Nitrite: NEGATIVE
Protein, ur: NEGATIVE mg/dL
Specific Gravity, Urine: 1.011 (ref 1.005–1.030)
pH: 7 (ref 5.0–8.0)

## 2022-05-19 LAB — TYPE AND SCREEN
ABO/RH(D): O POS
Antibody Screen: NEGATIVE
Unit division: 0

## 2022-05-19 LAB — BASIC METABOLIC PANEL
Anion gap: 6 (ref 5–15)
BUN: 13 mg/dL (ref 8–23)
CO2: 29 mmol/L (ref 22–32)
Calcium: 8.3 mg/dL — ABNORMAL LOW (ref 8.9–10.3)
Chloride: 104 mmol/L (ref 98–111)
Creatinine, Ser: 0.44 mg/dL (ref 0.44–1.00)
GFR, Estimated: >60 mL/min (ref >60)
Glucose, Bld: 130 mg/dL — ABNORMAL HIGH (ref 70–99)
Potassium: 5 mmol/L (ref 3.5–5.1)
Sodium: 139 mmol/L (ref 135–145)

## 2022-05-19 LAB — BPAM RBC
Blood Product Expiration Date: 202402232359
ISSUE DATE / TIME: 202402041615
Unit Type and Rh: 9500

## 2022-05-19 LAB — MAGNESIUM: Magnesium: 1.8 mg/dL (ref 1.7–2.4)

## 2022-05-19 SURGERY — ESOPHAGOGASTRODUODENOSCOPY (EGD) WITH PROPOFOL
Anesthesia: General

## 2022-05-19 MED ORDER — SODIUM CHLORIDE 0.9 % IV SOLN: INTRAVENOUS | Status: DC

## 2022-05-19 MED ORDER — SODIUM CHLORIDE 0.9 % IV SOLN
3.0000 g | Freq: Four times a day (QID) | INTRAVENOUS | Status: DC
  Administered 2022-05-19 – 2022-05-21 (×8): 3 g via INTRAVENOUS
  Filled 2022-05-19 (×9): qty 8

## 2022-05-19 MED ORDER — PROPOFOL 10 MG/ML IV BOLUS
INTRAVENOUS | Status: DC | PRN
  Administered 2022-05-19: 20 mg via INTRAVENOUS
  Administered 2022-05-19: 70 mg via INTRAVENOUS
  Administered 2022-05-19: 20 mg via INTRAVENOUS
  Administered 2022-05-19 (×2): 30 mg via INTRAVENOUS

## 2022-05-19 MED ORDER — PROPOFOL 10 MG/ML IV BOLUS
INTRAVENOUS | Status: AC
  Filled 2022-05-19: qty 20

## 2022-05-19 MED ORDER — LIDOCAINE HCL (CARDIAC) PF 100 MG/5ML IV SOSY
PREFILLED_SYRINGE | INTRAVENOUS | Status: DC | PRN
  Administered 2022-05-19: 50 mg via INTRAVENOUS

## 2022-05-19 MED ORDER — LACTATED RINGERS IV SOLN: INTRAVENOUS | Status: DC

## 2022-05-19 MED ORDER — CHLORHEXIDINE GLUCONATE CLOTH 2 % EX PADS
6.0000 | MEDICATED_PAD | Freq: Every day | CUTANEOUS | Status: DC
  Administered 2022-05-19 – 2022-05-20 (×2): 6 via TOPICAL

## 2022-05-19 MED ORDER — METOPROLOL TARTRATE 5 MG/5ML IV SOLN
INTRAVENOUS | Status: AC
  Filled 2022-05-19: qty 5

## 2022-05-19 MED ORDER — METOPROLOL TARTRATE 5 MG/5ML IV SOLN
INTRAVENOUS | Status: DC | PRN
  Administered 2022-05-19: 1 mg via INTRAVENOUS

## 2022-05-19 MED ORDER — SACCHAROMYCES BOULARDII 250 MG PO CAPS
250.0000 mg | ORAL_CAPSULE | Freq: Two times a day (BID) | ORAL | Status: DC
  Administered 2022-05-19 – 2022-05-21 (×5): 250 mg via ORAL
  Filled 2022-05-19 (×5): qty 1

## 2022-05-19 NOTE — Plan of Care (Signed)
  Problem: Education: ?Goal: Knowledge of General Education information will improve ?Description: Including pain rating scale, medication(s)/side effects and non-pharmacologic comfort measures ?Outcome: Progressing ?  ?Problem: Nutrition: ?Goal: Adequate nutrition will be maintained ?Outcome: Progressing ?  ?Problem: Coping: ?Goal: Level of anxiety will decrease ?Outcome: Progressing ?  ?Problem: Elimination: ?Goal: Will not experience complications related to bowel motility ?Outcome: Progressing ?Goal: Will not experience complications related to urinary retention ?Outcome: Progressing ?  ?Problem: Pain Managment: ?Goal: General experience of comfort will improve ?Outcome: Progressing ?  ?Problem: Safety: ?Goal: Ability to remain free from injury will improve ?Outcome: Progressing ?  ?Problem: Skin Integrity: ?Goal: Risk for impaired skin integrity will decrease ?Outcome: Progressing ?  ?

## 2022-05-19 NOTE — Transfer of Care (Signed)
Immediate Anesthesia Transfer of Care Note  Patient: Courtney Zuniga  Procedure(s) Performed: ESOPHAGOGASTRODUODENOSCOPY (EGD) WITH PROPOFOL HOT HEMOSTASIS (ARGON PLASMA COAGULATION/BICAP)  Patient Location: PACU  Anesthesia Type:General  Level of Consciousness: drowsy  Airway & Oxygen Therapy: Patient Spontanous Breathing and Patient connected to nasal cannula oxygen  Post-op Assessment: Report given to RN and Post -op Vital signs reviewed and stable  Post vital signs: Reviewed and stable  Last Vitals:  Vitals Value Taken Time  BP 108/72   Temp 98   Pulse 101 05/19/22 1144  Resp 34   SpO2 100 % 05/19/22 1144  Vitals shown include unvalidated device data.  Last Pain:  Vitals:   05/19/22 1119  TempSrc:   PainSc: 0-No pain      Patients Stated Pain Goal: 0 (81/10/31 5945)  Complications: No notable events documented.

## 2022-05-19 NOTE — Consult Note (Signed)
Gastroenterology Consult   Referring Provider: [facility] Primary Care Physician:  [name], FNP Primary Gastroenterologist:  Dr. Gala Romney   Patient ID: Courtney Zuniga; [id number]; [birth year]   Admit date: 05/18/2022  LOS: 1 day   Date of Consultation: 05/19/2022  Reason for Consultation:  GI bleed  History of Present Illness   Courtney Zuniga is a 72 y.o. 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 Admitted with bilateral pneumonia, and GI consulted due to heme positive stool and acute on chronic anemia.   Pertinent findings in the ED: Hgb 7.5, down from 10 several weeks ago, blood cultures pending but negative thus far, BNP elevated at 197. CXR suspicious for pneumonia, small right pleural effusion, severe emphysema, pulmonary arterial HTN.Received 1 unit PRBCs with improvement in Hgb to 8.7 this morning.   Patient notes acute onset of diffuse abdominal pain on Friday. Drank milk trying to relieve discomfort. No N/V. States her stools were dark/black. Denies oral iron. She did states she took Maalox around this time as well. No postprandial abdominal pain. Pain is now resolved. She takes 1 BC powder daily for chronic headaches. Took 3 Ibuprofen last Tuesday. Chronic GERD on pantoprazole daily. No dysphagia. Denying any weight loss or lack of appetite. She feels she is back to her respiratory baseline. On 4 liters nasal cannula currently without any distress. She would like some Ensure.   Last EGD in May 2017 with sumucosal gastric nodule. Subsequent EUS by Dr. Ardis Hughs in 2017 and 2018 for surveillance showed no change in  submucosal lesion, suspecting small GIST.      EGD May 2017: normal esophagus, proximal submucosal gastric nodule, erosive gastropathy, duodenal erosions. Path with benign gastric mucosa with chronic gastritis, negative H.pylori.   EUS June 2017 by Dr. Ardis Hughs: likely benign gastric tumor in the fundus, suspect small GIST, s/p sampling. Path negative.   EUS Nov 2018 for surveillance: submucosal lesion unchanged from prior evaluation. No need for further evaluation.   Last colonoscopy around ?2015 per patient.    Past Medical History:  Diagnosis Date   Anemia    as a young woman   Arthritis    osteoartritis   Asthma    Brain tumor (benign) ([place]) 2005 [facility]   Benign   Chronic headaches    Chronic hip pain    Chronic pain    COPD (chronic obstructive pulmonary disease) (HCC)    Coronary artery disease    Depression    Depression 05/15/2016   Encounter for antineoplastic chemotherapy 01/10/2016   GERD (gastroesophageal reflux disease)    Hypertension    Lung cancer ([place]) dx'd 01/2016   currently on chemo and radiation    NSTEMI (non-ST elevated myocardial infarction) ([place]) yrs ago   On home O2    qhs 2 liters at hs and prn   Pneumonia last time 2 yrs ago   Shortness of breath dyspnea    with activity    Past Surgical History:  Procedure Laterality Date   CHOLECYSTECTOMY     COLONOSCOPY  2015   Results requested from [facility]   COLONOSCOPY     ESOPHAGOGASTRODUODENOSCOPY N/A 08/14/2015   Procedure: ESOPHAGOGASTRODUODENOSCOPY (EGD);  Surgeon: Daneil Dolin, MD;  [facility];  Service: Endoscopy;  Laterality: N/A;  215    ESOPHAGOGASTRODUODENOSCOPY (EGD) WITH PROPOFOL N/A 09/13/2015   Procedure: ESOPHAGOGASTRODUODENOSCOPY (EGD) WITH PROPOFOL;  Surgeon: Milus Banister, MD;  Location: [facility];  Service: Endoscopy;  Laterality: N/A;   EUS N/A 03/12/2017   Procedure: UPPER ENDOSCOPIC ULTRASOUND (EUS) RADIAL;  Surgeon: Milus Banister, MD;   Location: [facility];  Service: Endoscopy;  Laterality: N/A;   IR IMAGING GUIDED PORT INSERTION  01/20/2022   TUMOR REMOVAL  2005   Benign   UPPER ESOPHAGEAL ENDOSCOPIC ULTRASOUND (EUS)  09/13/2015   Procedure: UPPER ESOPHAGEAL ENDOSCOPIC ULTRASOUND (EUS);  Surgeon: Milus Banister, MD;  Location: [facility];  Service: Endoscopy;;   VIDEO BRONCHOSCOPY WITH ENDOBRONCHIAL NAVIGATION N/A 12/31/2015   Procedure: VIDEO BRONCHOSCOPY WITH ENDOBRONCHIAL NAVIGATION;  Surgeon: Melrose Nakayama, MD;  Location: [place];  Service: Thoracic;  Laterality: N/A;   VIDEO BRONCHOSCOPY WITH ENDOBRONCHIAL ULTRASOUND N/A 11/08/2015   Procedure: VIDEO BRONCHOSCOPY WITH ENDOBRONCHIAL ULTRASOUND;  Surgeon: Ivin Poot, MD;  Location: [place];  Service: Thoracic;  Laterality: N/A;    Prior to Admission medications   Medication Sig Start Date End Date Taking? Authorizing Provider  acetaminophen (TYLENOL) 325 MG tablet Take 2 tablets (650 mg total) by mouth every 6 (six) hours as needed for mild pain, fever or headache. 05/08/18   Denton Brick, Courage, MD  albuterol (VENTOLIN HFA) 108 (90 Base) MCG/ACT inhaler Inhale 2 puffs into the lungs every 4 (four) hours as needed for shortness of breath. 04/26/22   Roxan Hockey, MD  cyclobenzaprine (FLEXERIL) 10 MG tablet Take 1 tablet (10 mg total) by mouth 2 (two) times daily. *MAy take one additional tablet as needed for muscle spasms Patient taking differently: Take 10 mg by mouth 2 (two) times daily. *May take one additional tablet as needed for muscle spasms 05/08/18   Roxan Hockey, MD  DULoxetine (CYMBALTA) 60 MG capsule Take 1 capsule (60 mg total) by mouth 2 (two) times daily. 04/26/22   Emokpae, Courage, MD  ENSURE (ENSURE) Take 237 mLs by mouth 3 (three) times daily between meals.    [provider]  fluticasone furoate-vilanterol (BREO ELLIPTA) 200-25 MCG/ACT AEPB Inhale 1 puff into the lungs daily. 04/26/22   Roxan Hockey, MD  HYDROcodone bit-homatropine  (HYCODAN) 5-1.5 MG/5ML syrup Take 5 mLs by mouth every 6 (six) hours as needed for cough. Patient not taking: Reported on 04/23/2022 02/26/22   Curt Bears, MD  Ipratropium-Albuterol (COMBIVENT RESPIMAT) 20-100 MCG/ACT AERS respimat Inhale 1 puff into the lungs every 6 (six) hours as needed for wheezing or shortness of breath. 04/26/22   Emokpae, Courage, MD  ipratropium-albuterol (DUONEB) 0.5-2.5 (3) MG/3ML SOLN Take 3 mLs by nebulization every 6 (six) hours as needed. 04/26/22   Roxan Hockey, MD  lidocaine-prilocaine (EMLA) cream Apply to the Port-A-Cath site 30-60 minutes before treatment Patient not taking: Reported on 04/23/2022 01/01/22   Curt Bears, MD  megestrol (MEGACE ES) 625 MG/5ML suspension Take 5 mLs (625 mg total) by mouth daily. 01/29/22   Heilingoetter, Cassandra L, PA-C  metoprolol tartrate (LOPRESSOR) 25 MG tablet Take 1 tablet (25 mg total) by mouth 2 (two) times daily. 04/26/22 05/26/22  Roxan Hockey, MD  oseltamivir (TAMIFLU) 30 MG capsule Take 1 capsule (30 mg total) by mouth 2 (two) times daily. 04/26/22   Roxan Hockey, MD  oxyCODONE-acetaminophen (PERCOCET/ROXICET) 5-325 MG tablet Take 1 tablet by mouth every 8 (eight) hours as needed for  moderate pain. Patient not taking: Reported on 04/23/2022 05/08/18   Roxan Hockey, MD  pantoprazole (PROTONIX) 40 MG tablet Take 1 tablet (40 mg total) by mouth daily. 04/27/22   Roxan Hockey, MD  predniSONE (DELTASONE) 20 MG tablet Take 1 tablet (20 mg total) by mouth daily with breakfast. Patient not taking: Reported on 05/18/2022 11/25/21   Azucena Cecil, PA-C  prochlorperazine (COMPAZINE) 10 MG tablet Take 1 tablet (10 mg total) by mouth every 6 (six) hours as needed for nausea or vomiting. 04/26/22   Roxan Hockey, MD  tamsulosin (FLOMAX) 0.4 MG CAPS capsule Take 1 capsule (0.4 mg total) by mouth daily after supper. 04/26/22   Roxan Hockey, MD  topiramate (TOPAMAX) 25 MG tablet Take 1 tablet (25 mg total) by  mouth 2 (two) times daily. 04/26/22   Emokpae, Courage, MD  TRELEGY ELLIPTA 100-62.5-25 MCG/ACT AEPB Inhale 1 puff into the lungs daily. 04/26/22   Roxan Hockey, MD  Vitamin D, Ergocalciferol, (DRISDOL) 1.25 MG (50000 UNIT) CAPS capsule Take 1 capsule (50,000 Units total) by mouth once a week. 04/26/22   Roxan Hockey, MD  HYDROcodone bit-homatropine (HYCODAN) 5-1.5 MG/5ML syrup Take 5 mLs by mouth every 6 (six) hours as needed for cough. 11/10/21   Curt Bears, MD    Current Facility-Administered Medications  Medication Dose Route Frequency Provider Last Rate Last Admin   acetaminophen (TYLENOL) tablet 650 mg  650 mg Oral Q6H PRN Wynetta Emery, Clanford L, MD   650 mg at 05/19/22 0559   Or   acetaminophen (TYLENOL) suppository 650 mg  650 mg Rectal Q6H PRN Johnson, Clanford L, MD       Ampicillin-Sulbactam (UNASYN) 3 g in sodium chloride 0.9 % 100 mL IVPB  3 g Intravenous Q6H Johnson, Clanford L, MD 200 mL/hr at 05/19/22 0925 3 g at 05/19/22 0925   bisacodyl (DULCOLAX) EC tablet 5 mg  5 mg Oral Daily PRN Wynetta Emery, Clanford L, MD       Chlorhexidine Gluconate Cloth 2 % PADS 6 each  6 each Topical Daily Johnson, Clanford L, MD   6 each at 05/19/22 0914   DULoxetine (CYMBALTA) DR capsule 60 mg  60 mg Oral BID Johnson, Clanford L, MD   60 mg at 05/19/22 0914   feeding supplement (ENSURE ENLIVE / ENSURE PLUS) liquid 237 mL  237 mL Oral TID BM Johnson, Clanford L, MD   237 mL at 05/18/22 2107   fentaNYL (SUBLIMAZE) injection 12.5 mcg  12.5 mcg Intravenous Q2H PRN Johnson, Clanford L, MD       fluticasone furoate-vilanterol (BREO ELLIPTA) 200-25 MCG/ACT 1 puff  1 puff Inhalation Daily Johnson, Clanford L, MD   1 puff at 05/19/22 0718   ipratropium-albuterol (DUONEB) 0.5-2.5 (3) MG/3ML nebulizer solution 3 mL  3 mL Nebulization Q6H Johnson, Clanford L, MD   3 mL at 05/19/22 2355   lactated ringers infusion   Intravenous Continuous Johnson, Clanford L, MD 50 mL/hr at 05/19/22 0135 Infusion Verify at  05/19/22 0135   megestrol (MEGACE) 400 MG/10ML suspension 400 mg  400 mg Oral Daily Johnson, Clanford L, MD       methylPREDNISolone sodium succinate (SOLU-MEDROL) 40 mg/mL injection 40 mg  40 mg Intravenous Q12H Johnson, Clanford L, MD   40 mg at 05/19/22 0910   ondansetron (ZOFRAN) tablet 4 mg  4 mg Oral Q6H PRN Johnson, Clanford L, MD       Or   ondansetron (ZOFRAN) injection 4 mg  4 mg Intravenous Q6H PRN Wynetta Emery,  Clanford L, MD   4 mg at 05/18/22 1709   oxyCODONE (Oxy IR/ROXICODONE) immediate release tablet 5 mg  5 mg Oral Q6H PRN Johnson, Clanford L, MD       pantoprazole (PROTONIX) injection 40 mg  40 mg Intravenous Q12H Godfrey Pick, MD   40 mg at 05/19/22 7253   saccharomyces boulardii (FLORASTOR) capsule 250 mg  250 mg Oral BID Wynetta Emery, Clanford L, MD   250 mg at 05/19/22 0914   tamsulosin (FLOMAX) capsule 0.4 mg  0.4 mg Oral QPC supper Wynetta Emery, Clanford L, MD   0.4 mg at 05/18/22 1724   topiramate (TOPAMAX) tablet 25 mg  25 mg Oral BID Johnson, Clanford L, MD   25 mg at 05/19/22 0914   zolpidem (AMBIEN) tablet 5 mg  5 mg Oral QHS PRN Murlean Iba, MD        Allergies as of 05/18/2022 - Review Complete 05/18/2022  Allergen Reaction Noted   Desyrel [trazodone]  07/17/2021    Family History  Problem Relation Age of Onset   Ovarian cancer Mother    Lung cancer Father    Brain cancer Sister        64 sister   Lung cancer Brother    Lung cancer Brother    Prostate cancer Brother    Colon cancer Neg Hx    Colon polyps Neg Hx     Social History   Socioeconomic History   Marital status: Single    Spouse name: Not on file   Number of children: Not on file   Years of education: 10th grade   Highest education level: Not on file  Occupational History   Occupation: retired  Tobacco Use   Smoking status: Former    Packs/day: 1.00    Years: 38.00    Total pack years: 38.00    Types: Cigarettes    Quit date: 07/26/2018    Years since quitting: 3.8   Smokeless  tobacco: Never   Tobacco comments:    Pt has asked doctor for med to help   Vaping Use   Vaping Use: Never used  Substance and Sexual Activity   Alcohol use: No    Alcohol/week: 0.0 standard drinks of alcohol   Drug use: No   Sexual activity: Not Currently  Other Topics Concern   Not on file  Social History Narrative   Not on file   Social Determinants of Health   Financial Resource Strain: Low Risk  (05/05/2018)   Overall Financial Resource Strain (CARDIA)    Difficulty of Paying Living Expenses: Not very hard  Food Insecurity: No Food Insecurity (05/18/2022)   Hunger Vital Sign    Worried About Running Out of Food in the Last Year: Never true    Ran Out of Food in the Last Year: Never true  Transportation Needs: No Transportation Needs (05/18/2022)   PRAPARE - Hydrologist (Medical): No    Lack of Transportation (Non-Medical): No  Physical Activity: Insufficiently Active (05/05/2018)   Exercise Vital Sign    Days of Exercise per Week: 5 days    Minutes of Exercise per Session: 10 min  Stress: No Stress Concern Present (05/05/2018)   [place]    Feeling of Stress : Not at all  Social Connections: Somewhat Isolated (05/05/2018)   Social Connection and Isolation Panel [NHANES]    Frequency of Communication with Friends and Family: More than three times  a week    Frequency of Social Gatherings with Friends and Family: More than three times a week    Attends Religious Services: More than 4 times per year    Active Member of [employer] or Organizations: No    Attends Archivist Meetings: Never    Marital Status: Never married  Intimate Partner Violence: Not At Risk (05/18/2022)   Humiliation, Afraid, Rape, and Kick questionnaire    Fear of Current or Ex-Partner: No    Emotionally Abused: No    Physically Abused: No    Sexually Abused: No     Review of Systems   Gen: Denies any  fever, chills, loss of appetite, change in weight or weight loss CV: Denies chest pain, heart palpitations, syncope, edema  Resp: Denies shortness of breath with rest, cough, wheezing, coughing up blood, and pleurisy. GI: Denies vomiting blood, jaundice, and fecal incontinence.   Denies dysphagia or odynophagia. GU : Denies urinary burning, blood in urine, urinary frequency, and urinary incontinence. MS: Denies joint pain, limitation of movement, swelling, cramps, and atrophy.  Derm: Denies rash, itching, dry skin, hives. Psych: Denies depression, anxiety, memory loss, hallucinations, and confusion. Heme: Denies bruising or bleeding Neuro:  Denies any headaches, dizziness, paresthesias, shaking  Physical Exam   Vital Signs in last 24 hours: Temp:  [97.7 F (36.5 C)-99.5 F (37.5 C)] 97.7 F (36.5 C) (02/05 0604) Pulse Rate:  [84-107] 101 (02/05 0722) Resp:  [14-34] 16 (02/05 0722) BP: (98-130)/(64-91) 98/67 (02/05 0722) SpO2:  [88 %-100 %] 99 % (02/05 0722) Weight:  [51.5 kg] 51.5 kg (02/04 1238) Last BM Date : 05/15/22  General:   Alert,  chronically ill-appearing, no distress.  Head:  Normocephalic and atraumatic. Eyes:  Sclera clear, no icterus.    Ears:  Normal auditory acuity. Lungs:  Clear throughout to auscultation.   4 liters nasal cannula O2 Heart: S1 S2 present without obvious murmur Abdomen:  Soft, nontender and nondistended. No masses, hepatosplenomegaly or hernias noted. Normal bowel sounds, without guarding, and without rebound.   Rectal: deferred   Msk:  Symmetrical without gross deformities.  Extremities:  Without edema. Neurologic:  Alert and  oriented x4. Skin:  Intact without significant lesions or rashes. Psych:  Alert and cooperative. Normal mood and affect.  Intake/Output from previous day: 02/04 0701 - 02/05 0700 In: 1839.7 [P.O.:240; I.V.:683.5; Blood:466; IV Piggyback:450.2] Out: 0  Intake/Output this shift: No intake/output data  recorded.  Labs/Studies   Recent Labs Recent Labs    05/18/22 1305 05/19/22 0449  WBC 7.5 5.6  HGB 7.5* 8.7*  HCT 23.3* 27.6*  PLT 391 354   BMET Recent Labs    05/18/22 1305 05/19/22 0449  NA 137 139  K 3.9 5.0  CL 102 104  CO2 27 29  GLUCOSE 108* 130*  BUN 11 13  CREATININE 0.51 0.44  CALCIUM 8.5* 8.3*   LFT Recent Labs    05/18/22 1305  PROT 6.4*  ALBUMIN 2.9*  AST 17  ALT 13  ALKPHOS 218*  BILITOT 0.3   PT/INR Recent Labs    05/18/22 1305  LABPROT 13.8  INR 1.1     Radiology/Studies DG Chest Port 1 View  Result Date: 05/18/2022 CLINICAL DATA:  Weakness, possible sepsis EXAM: PORTABLE CHEST 1 VIEW COMPARISON:  04/23/2022 FINDINGS: Severe emphysema. Chronic right middle lobe scarring. Increased airspace opacity in both lower lobes. Blunting of the right costophrenic angle suggesting small right pleural effusion. Chronically accentuated interstitium in the lungs. Power  injectable right Port-A-Cath tip: SVC. Atherosclerotic calcification of the aortic arch. Prominent main pulmonary artery compatible with pulmonary arterial hypertension. Heart size within normal limits. Thoracic spondylosis. Atherosclerotic calcification of the aortic arch. IMPRESSION: 1. Increased airspace opacity in both lower lobes, suspicious for pneumonia or aspiration pneumonitis. 2. Small right pleural effusion. 3. Severe emphysema. 4. Prominent main pulmonary artery compatible with pulmonary arterial hypertension. 5. Chronic scarring and volume loss in the right middle lobe. 6. Thoracic spondylosis. Electronically Signed   By: Van Clines M.D.   On: 05/18/2022 13:45     Assessment   Courtney Zuniga is a 72 y.o. year old female  with history of recurrent metastatic non-small cell lung cancer initially diagnosed in Sept 2017 and currently undergoing treatment with immunotherapy, chronic respiratory failure with COPD on home oxygen of 4 liters, HTN, GERD, depression, NSTEMI, gastric  lesion felt to be small GIST (2017 diagnosed), presenting to the ED yesterday with abdominal pain, reported black stools, progressive weakness. Admitted with bilateral pneumonia, and GI consulted due to heme positive stool and acute on chronic anemia.   Acute on chronic anemia: Hgb 7.5 on presentation, down from 10/11 range several weeks ago. Heme positive in the ED. She notes concern for melena; however, she had also taken Maalox prior to admission. In setting of daily aspirin powders and recent Ibuprofen as well, suspect UGI source. She does have a history of presumed small GIST, initially diagnosed in 2017. Last EUS in 2018 without change in lesion size; no further surveillance had been recommended.   Pneumonia: she appears at baseline respiratory status. On 4 liters nasal cannula O2, which is her home setting.   Isolated elevated alk phos: chronic. Can evaluate further as outpatient.    Plan / Recommendations    Remain NPO Continue PPI IV BID Proceed with upper endoscopy by Dr. Abbey Chatters today: the risks, benefits, and alternatives have been discussed with the patient in detail. The patient states understanding and desires to proceed.  Further recommendations to follow      05/19/2022, 9:46 AM  Annitta Needs, PhD, ANP-BC [facility]

## 2022-05-19 NOTE — Progress Notes (Signed)
   05/19/22 0504  Assess: MEWS Score  Temp 98.2 F (36.8 C)  BP 100/64  MAP (mmHg) 74  Pulse Rate (!) 105  Resp (!) 32  Level of Consciousness Alert  SpO2 98 %  O2 Device Room Air  Assess: MEWS Score  MEWS Temp 0  MEWS Systolic 1  MEWS Pulse 1  MEWS RR 2  MEWS LOC 0  MEWS Score 4  MEWS Score Color Red  Assess: if the MEWS score is Yellow or Red  Were vital signs taken at a resting state? Yes  Focused Assessment No change from prior assessment  Does the patient meet 2 or more of the SIRS criteria? No  MEWS guidelines implemented *See Row Information* No, previously yellow, continue vital signs every 4 hours  Treat  Pain Scale 0-10  Pain Score 3  Pain Type Acute pain  Pain Location Head  Pain Descriptors / Indicators Aching;Headache  Pain Frequency Intermittent  Pain Onset Gradual  Patients Stated Pain Goal 0  Pain Intervention(s) Repositioned;Elevated extremity;Rest  Take Vital Signs  Increase Vital Sign Frequency  Red: Q 1hr X 4 then Q 4hr X 4, if remains red, continue Q 4hrs  Escalate  MEWS: Escalate Red: discuss with charge nurse/RN and provider, consider discussing with RRT  Notify: Charge Nurse/RN  Name of Charge Nurse/RN Notified [place], RN  Date Charge Nurse/RN Notified 05/19/22  Time Charge Nurse/RN Notified 0530  Provider Notification  Provider Name/Title Cloyd Stagers  Date Provider Notified 05/19/22  Time Provider Notified 0522  Method of Notification Page  Notification Reason Change in status;Other (Comment) (RED MEWS)  Provider response No new orders (continue to monitor closely per MD)  Date of Provider Response 05/19/22  Time of Provider Response [phone]  Assess: SIRS CRITERIA  SIRS Temperature  0  SIRS Pulse 1  SIRS Respirations  1  SIRS WBC 0  SIRS Score Sum  2   Reassessment: yellow mews, no change in respiratory status-pt remains tachypneic. Denies distress. Medicated for headache. Denies abdominal pain. Pt had BM, brown, not dark or black, no  visible signs of bleeding.

## 2022-05-19 NOTE — Op Note (Signed)
[facility] Patient Name: Courtney Zuniga Procedure Date: 05/19/2022 11:11 AM MRN: [id number] Date of Birth: 05/19/1950 Attending MD: Elon Alas. Abbey Chatters , [place], [id number] CSN: [id number] Age: 72 Admit Type: Outpatient Procedure:                Upper GI endoscopy Indications:              Acute post hemorrhagic anemia, Melena Providers:                Elon Alas. Abbey Chatters, DO, Janeece Riggers, RN, Illene Labrador Referring MD:              Medicines:                See the Anesthesia note for documentation of the                            administered medications Complications:            No immediate complications. Estimated Blood Loss:     Estimated blood loss was minimal. Procedure:                Pre-Anesthesia Assessment:                           - The anesthesia plan was to use monitored                            anesthesia care (MAC).                           After obtaining informed consent, the endoscope was                            passed under direct vision. Throughout the                            procedure, the patient's blood pressure, pulse, and                            oxygen saturations were monitored continuously. The                            GIF-H190 ([id number]) scope was introduced through the                            mouth, and advanced to the second part of duodenum.                            The upper GI endoscopy was accomplished without                            difficulty. The patient tolerated the procedure                            well. Scope In:  11:25:57 AM Scope Out: 11:41:40 AM Total Procedure Duration: 0 hours 15 minutes 43 seconds  Findings:      There is no endoscopic evidence of bleeding, esophagitis, ulcerations or       varices in the entire esophagus.      A single 12 mm submucosal papule (nodule) with no bleeding and no       stigmata of recent bleeding was found in the gastric fundus.      One  non-bleeding cratered gastric ulcer with a clean ulcer base (Forrest       Class III) was found at the pylorus. The lesion was 10 mm in largest       dimension.      Red blood was found in the first portion of the duodenum.      A single 4 mm angiodysplastic lesion actively bleeding was found in the       first portion of the duodenum. Coagulation for hemostasis using argon       plasma was successful.      Multiple small angiodysplastic lesions without bleeding were found in       the duodenal bulb, in the first portion of the duodenum, in the second       portion of the duodenum and in the third portion of the duodenum.       Coagulation for tissue destruction using argon plasma was successful. Impression:               - A single submucosal papule (nodule) found in the                            stomach.                           - Non-bleeding gastric ulcer with a clean ulcer                            base (Forrest Class III).                           - Blood in the first portion of the duodenum.                           - A single bleeding angiodysplastic lesion in the                            duodenum. Treated with argon plasma coagulation                            (APC).                           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 Treated with argon plasma coagulation                            (APC).                           -  No specimens collected. Moderate Sedation:      Per Anesthesia Care Recommendation:           - Return patient to hospital ward for ongoing care.                           - Soft diet.                           - Use Protonix (pantoprazole) 40 mg PO BID. Procedure Code(s):        --- Professional ---                           [phone], Esophagogastroduodenoscopy, flexible,                            transoral; with ablation of tumor(s), polyp(s), or                            other lesion(s)  (includes pre- and post-dilation                            and guide wire passage, when performed)                           43255, 35, Esophagogastroduodenoscopy, flexible,                            transoral; with control of bleeding, any method Diagnosis Code(s):        --- Professional ---                           K31.89, Other diseases of stomach and duodenum                           K25.9, Gastric ulcer, unspecified as acute or                            chronic, without hemorrhage or perforation                           K92.2, Gastrointestinal hemorrhage, unspecified                           K31.811, Angiodysplasia of stomach and duodenum                            with bleeding                           D62, Acute posthemorrhagic anemia                           K92.1, Melena (includes Hematochezia) CPT copyright 2022 American Medical Association. All rights reserved. The codes documented in this report are preliminary and upon coder review may  be revised to meet current compliance requirements. Elon Alas. Abbey Chatters, DO [place] Abbey Chatters,  DO 05/19/2022 11:46:51 AM This report has been signed electronically. Number of Addenda: 0

## 2022-05-19 NOTE — TOC Initial Note (Signed)
Transition of Care [facility]) - Initial/Assessment Note    Patient Details  Name: Courtney Zuniga MRN: [id number] Date of Birth: 1950-10-12  Transition of Care [facility]) CM/SW Contact:    Iona Beard, [place] Phone Number: 05/19/2022, 12:13 PM  Clinical Narrative:                 Pt is high risk for readmission. CSW spoke with pts daughter to complete assessment as pt is in endo. Pts son lives with her. Pt has a friend that comes out daily to assist pt in the home as needed. Pt uses community transportation to get to appointments. Pt was recently set up with [facility] through [place]. Pt does not use any DME in the home. TOC to follow for needs.   Expected Discharge Plan: [place] Barriers to Discharge: Continued Medical Work up   Patient Goals and CMS Choice Patient states their goals for this hospitalization and ongoing recovery are:: return home CMS Medicare.gov Compare Post Acute Care list provided to:: Patient Choice offered to / list presented to : Patient, Adult Children      Expected Discharge Plan and Services In-house Referral: Clinical Social Work Discharge Planning Services: CM Consult Post Acute Care Choice: [place] arrangements for the past 2 months: Single Family Home                                      Prior Living Arrangements/Services Living arrangements for the past 2 months: Single Family Home Lives with:: Self, Adult Children Patient language and need for interpreter reviewed:: Yes Do you feel safe going back to the place where you live?: Yes      Need for Family Participation in Patient Care: Yes (Comment) Care giver support system in place?: Yes (comment)   Criminal Activity/Legal Involvement Pertinent to Current Situation/Hospitalization: No - Comment as needed  Activities of Daily Living Home Assistive Devices/Equipment: Dentures (specify type), Oxygen ADL Screening (condition at time of admission) Patient's cognitive  ability adequate to safely complete daily activities?: Yes Is the patient deaf or have difficulty hearing?: Yes Does the patient have difficulty seeing, even when wearing glasses/contacts?: No Does the patient have difficulty concentrating, remembering, or making decisions?: No Patient able to express need for assistance with ADLs?: Yes Does the patient have difficulty dressing or bathing?: No Independently performs ADLs?: Yes (appropriate for developmental age) Does the patient have difficulty walking or climbing stairs?: No Weakness of Legs: None Weakness of Arms/Hands: None  Permission Sought/Granted                  Emotional Assessment Appearance:: Appears stated age Attitude/Demeanor/Rapport: Engaged Affect (typically observed): Accepting Orientation: : Oriented to Self, Oriented to Place, Oriented to  Time, Oriented to Situation Alcohol / Substance Use: Not Applicable Psych Involvement: No (comment)  Admission diagnosis:  Upper GI bleed [K92.2] COPD exacerbation (HCC) [J44.1] Generalized weakness [R53.1] Exertional shortness of breath [R06.02] Symptomatic anemia [D64.9] Pneumonia of both lower lobes due to infectious organism [J18.9] Patient Active Problem List   Diagnosis Date Noted   Upper GI bleed 05/18/2022   Acute blood loss anemia 05/18/2022   Acute respiratory failure with hypoxia ([place]) 04/23/2022   Influenza B 04/23/2022   Frequent PVCs 04/09/2022   Port-A-Cath in place 03/03/2022   Increased frequency of headaches 10/18/2020   COPD exacerbation ([place]) 11/05/2019   Chronic nonintractable headache  Acute on chronic respiratory failure with hypoxia and hypercapnia (HCC) 06/16/2018   Influenza A 06/16/2018   Severe protein-calorie malnutrition ([place]) 06/16/2018   GERD (gastroesophageal reflux disease) 06/16/2018   Bilateral pneumonia 05/05/2018   Sepsis due to CAP 05/05/2018   Chronic respiratory failure ([place]) 05/05/2018   Encounter for antineoplastic  immunotherapy 04/23/2017   Depression 05/15/2016   Adenocarcinoma of right lung, stage 3 ([place]) 01/10/2016   Encounter for antineoplastic chemotherapy 01/10/2016   Lung mass 01/03/2016   Arthritis    Subepithelial gastric mass    Mucosal abnormality of stomach    Abdominal pain 08/08/2015   Abnormal CT of the abdomen 08/08/2015   Influenza with pneumonia 06/27/2014   Hypoxia 06/26/2014   COPD with acute exacerbation ([place]) 06/26/2014   CAP (community acquired pneumonia)    Chest pain of uncertain etiology    Asthma, chronic    Chest pain 02/16/2014   HTN (hypertension) 02/16/2014   DDD (degenerative disc disease), lumbar 02/04/2011   Lumbar herniated disc 01/09/2011   PCP:  [name], FNP Pharmacy:   [place], [place] - [address] [place] [address] Phone: [phone] Fax: [phone]  [facility] [place] [place] [address] Phone: [phone] Fax: [phone]  [facility] [phone] - , [place] - [address] AT [place] [address] [place] [address] Phone: [phone] Fax: [phone]     Social Determinants of Health ([place]) Social History: SDOH Screenings   Food Insecurity: No Food Insecurity (05/18/2022)  Housing: Low Risk  (05/18/2022)  Transportation Needs: No Transportation Needs (05/18/2022)  Utilities: Not At Risk (05/18/2022)  Financial Resource Strain: Low Risk  (05/05/2018)  Physical Activity: Insufficiently Active (05/05/2018)  Social Connections: Somewhat Isolated (05/05/2018)  Stress: No Stress Concern Present (05/05/2018)  Tobacco Use: Medium Risk (05/19/2022)   SDOH Interventions:     Readmission Risk Interventions    05/19/2022   12:12 PM 04/26/2022    1:03 PM 08/09/2020   12:41 PM  Readmission Risk Prevention Plan  Medication Screening   Complete  Transportation Screening  Complete Complete Complete  HRI or Home Care Consult Complete Complete   Social Work Consult for [place] Planning/Counseling Complete Complete   Palliative Care Screening Not Applicable Not Applicable   Medication Review Press photographer) Complete Complete

## 2022-05-19 NOTE — Progress Notes (Signed)
PROGRESS NOTE   Courtney Zuniga  TDS:[id number] DOB: February 06, 1951 DOA: 05/18/2022 PCP: [name], FNP   Chief Complaint  Patient presents with   Fatigue   Level of care: Telemetry  Brief Admission History:  72 year old female with a history of non-small cell lung cancer of the right lung with recurrence, GERD, depression, COPD, chronic hypoxic respiratory failure requiring 4 L nasal cannula at all times, hypertension, former smoker, anemia, NSTEMI, chronic hip pain,, headache, benign brain tumor, gastric tumor who was discharged last month from [facility] with COPD exacerbation and influenza B infection.    She is reporting that a few days ago she started spontaneously having severe sharp gastric pains and discomfort.  She started drinking milk to try and coat her stomach on the inside.  She also noticed having black stools.  She reports that the pain has subsequently subsided.  She has been having progressive weakness especially with ambulating short distances.  She denies fever and chills.  She has chronic cough symptoms.  Patient denies nausea vomiting and diarrhea.  EMS reported that patient had a desaturation of pulse ox to 76% with ambulation.  They placed her on a nonrebreather and noted patient had labored breathing.  In the ED she was noted to have acute drop in hemoglobin of 11 a month ago down to 7.5.  She was seen to have a black stool that tested Hemoccult positive.  GI was consulted and recommended patient be kept n.p.o. after midnight in anticipation of possible upper endoscopy.  Patient is being transfused 1 unit PRBC.  Her chest x-ray shows findings of bilateral pneumonia as she has been started on antibiotic coverage.  Admission requested for further management.   Assessment and Plan:  Acute Upper GI bleed -marked drop in Hg from a month ago -agree with transfusing 1 unit of PRBC -following Hg -IV protonix ordered BID -GI consult with plan for EGD on 2/5 -EGD planned  today 2/5 -- follow up findings and recs    Bilateral Pneumonia  -findings concerning for aspiration  -SLP evaluation and dysphagia diet ordered -continue IV unasyn as ordered -continue supportive measures    Acute on chronic respiratory failure with hypoxia  -pt required nonrebreather -likely exacerbated by ABLA -wean oxygen back down to regular 4L/min as able  -treating pneumonia and COPD exacerbation    COPD with acute exacerbation  -secondary to pneumonia, resolving recent viral infection  -continue antibiotics, steroids and bronchodilators  -supplemental oxygen with goal pulse ox >87%   Gastric Lesion  - discussed with GI, 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 - EGD per GI service  - IV protonix BID ordered    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 - chemotherapy per oncologist  - needs goals of care discussion on outpatient basis   Adult failure to thrive/Severe protein calorie malnutrition   - Goals of care need to be an ongoing discussion - pt seems to be in denial of how serious her condition is - nutritional supplements per dietitian     DVT prophylaxis: SCDs Code Status: Full  Family Communication:  Disposition: Status is: Inpatient Remains inpatient appropriate because: intensity    Consultants:  GI service 2/5> Procedures:  EGD 2/5   Antimicrobials:    Subjective: Pt says she feels better after blood transfusion.  SOB is slightly improved today.  Agreeable to EGD.    Objective: Vitals:   05/19/22 0722 05/19/22 0946 05/19/22 1051 05/19/22 1145  BP: 98/67 (!) 97/57 108/76 108/72  Pulse: (!) 101 (!) 103 (!) 104 (!) 101  Resp: 16 (!) 21 20 (!) 35  Temp:    98 F (36.7 C)  TempSrc:   Oral   SpO2: 99% 100% 97% 100%  Weight:   51.5 kg   Height:   5\' 7"  (1.702 m)     Intake/Output Summary (Last 24 hours) at 05/19/2022 1203 Last data filed at 05/19/2022 1143 Gross per 24 hour  Intake 2139.73 ml  Output 0 ml  Net 2139.73 ml   Filed Weights   05/18/22 1238  05/19/22 1051  Weight: 51.5 kg 51.5 kg   Examination:  General exam: cachectic frail female, awake, alert, Appears calm and comfortable  Respiratory system: Clear to auscultation. No wheeze heard. Respiratory effort normal. Cardiovascular system: normal S1 & S2 heard. Tachycardic rate, No JVD, murmurs, rubs, gallops or clicks. No pedal edema. Gastrointestinal system: Abdomen is nondistended, soft and nontender. No organomegaly or masses felt. Normal bowel sounds heard. Central nervous system: Alert and oriented. No focal neurological deficits. Extremities: Symmetric 5 x 5 power. Skin: No rashes, lesions or ulcers. Psychiatry: Judgement and insight appear normal. Mood & affect appropriate.   Data Reviewed: I have personally reviewed following labs and imaging studies  CBC: Recent Labs  Lab 05/18/22 1305 05/19/22 0449  WBC 7.5 5.6  NEUTROABS 6.2  --   HGB 7.5* 8.7*  HCT 23.3* 27.6*  MCV 79.8* 81.9  PLT 391 347    Basic Metabolic Panel: Recent Labs  Lab 05/18/22 1305 05/19/22 0449  NA 137 139  K 3.9 5.0  CL 102 104  CO2 27 29  GLUCOSE 108* 130*  BUN 11 13  CREATININE 0.51 0.44  CALCIUM 8.5* 8.3*  MG 1.8 1.8    CBG: No results for input(s): "GLUCAP" in the last 168 hours.  Recent Results (from the past 240 hour(s))  Blood Culture (routine x 2)     Status: None (Preliminary result)   Collection Time: 05/18/22  1:40 PM   Specimen: BLOOD RIGHT ARM  Result Value Ref Range Status   Specimen Description   Final    BLOOD RIGHT ARM BOTTLES DRAWN AEROBIC AND ANAEROBIC   Special Requests Blood Culture adequate volume  Final   Culture   Final    NO GROWTH < 24 HOURS Performed at [facility], [address], [place], [place] [address]    Report Status PENDING  Incomplete  Blood Culture (routine x 2)     Status: None (Preliminary result)   Collection Time: 05/18/22  1:40 PM   Specimen: BLOOD LEFT ARM  Result Value Ref Range Status   Specimen Description BLOOD LEFT ARM  BOTTLES DRAWN AEROBIC AND ANAEROBIC  Final   Special Requests Blood Culture adequate volume  Final   Culture   Final    NO GROWTH < 24 HOURS Performed at [facility], [address], [place], [place] [address]    Report Status PENDING  Incomplete     Radiology Studies: DG Chest Port 1 View  Result Date: 05/18/2022 CLINICAL DATA:  Weakness, possible sepsis EXAM: PORTABLE CHEST 1 VIEW COMPARISON:  04/23/2022 FINDINGS: Severe emphysema. Chronic right middle lobe scarring. Increased airspace opacity in both lower lobes. Blunting of the right costophrenic angle suggesting small right pleural effusion. Chronically accentuated interstitium in the lungs. Power injectable right Port-A-Cath tip: SVC. Atherosclerotic calcification of the aortic arch. Prominent main pulmonary artery compatible with pulmonary arterial hypertension. Heart size within normal limits. Thoracic spondylosis. Atherosclerotic calcification of the  aortic arch. IMPRESSION: 1. Increased airspace opacity in both lower lobes, suspicious for pneumonia or aspiration pneumonitis. 2. Small right pleural effusion. 3. Severe emphysema. 4. Prominent main pulmonary artery compatible with pulmonary arterial hypertension. 5. Chronic scarring and volume loss in the right middle lobe. 6. Thoracic spondylosis. Electronically Signed   By: Van Clines M.D.   On: 05/18/2022 13:45    Scheduled Meds:  [MAR Hold] Chlorhexidine Gluconate Cloth  6 each Topical Daily   [MAR Hold] DULoxetine  60 mg Oral BID   [MAR Hold] feeding supplement  237 mL Oral TID BM   [MAR Hold] fluticasone furoate-vilanterol  1 puff Inhalation Daily   [MAR Hold] ipratropium-albuterol  3 mL Nebulization Q6H   [MAR Hold] megestrol  400 mg Oral Daily   [MAR Hold] methylPREDNISolone (SOLU-MEDROL) injection  40 mg Intravenous Q12H   [MAR Hold] pantoprazole  40 mg Intravenous Q12H   [MAR Hold] saccharomyces boulardii  250 mg Oral BID   [MAR Hold] tamsulosin  0.4 mg Oral QPC supper    [MAR Hold] topiramate  25 mg Oral BID   Continuous Infusions:  sodium chloride     [MAR Hold] ampicillin-sulbactam (UNASYN) IV 3 g (05/19/22 0925)   lactated ringers 50 mL/hr at 05/19/22 0135   lactated ringers 10 mL/hr at 05/19/22 1054     LOS: 1 day   Time spent: 19 mins  Unity Luepke Wynetta Emery, MD How to contact the [facility] Attending or Consulting provider [place] or covering provider during after hours [place], for this patient?  Check the care team in [facility] and look for a) attending/consulting TRH provider listed and b) the [facility] team listed Log into [email or web address] and use [place]'s universal password to access. If you do not have the password, please contact the hospital operator. Locate the [facility] provider you are looking for under [facility] and page to a number that you can be directly reached. If you still have difficulty reaching the provider, please page the [facility] (Director on Call) for the Hospitalists listed on amion for assistance.  05/19/2022, 12:03 PM

## 2022-05-19 NOTE — Anesthesia Preprocedure Evaluation (Addendum)
Anesthesia Evaluation  Patient identified by MRN, date of birth, ID band Patient awake    Reviewed: Allergy & Precautions, H&P , NPO status , Patient's Chart, lab work & pertinent test results, reviewed documented beta blocker date and time   Airway Mallampati: II  TM Distance: >3 FB Neck ROM: full    Dental no notable dental hx.    Pulmonary asthma , COPD,  COPD inhaler and oxygen dependent, former smoker   Pulmonary exam normal breath sounds clear to auscultation       Cardiovascular Exercise Tolerance: Good hypertension, + CAD and + Past MI   Rhythm:regular Rate:Normal     Neuro/Psych  Headaches PSYCHIATRIC DISORDERS  Depression       GI/Hepatic Neg liver ROS,GERD  Medicated,,  Endo/Other  negative endocrine ROS    Renal/GU negative Renal ROS  negative genitourinary   Musculoskeletal   Abdominal   Peds  Hematology  (+) Blood dyscrasia, anemia   Anesthesia Other Findings   Reproductive/Obstetrics negative OB ROS                             Anesthesia Physical Anesthesia Plan  ASA: 4 and emergent  Anesthesia Plan: General   Post-op Pain Management:    Induction:   PONV Risk Score and Plan: Propofol infusion  Airway Management Planned:   Additional Equipment:   Intra-op Plan:   Post-operative Plan:   Informed Consent: I have reviewed the patients History and Physical, chart, labs and discussed the procedure including the risks, benefits and alternatives for the proposed anesthesia with the patient or authorized representative who has indicated his/her understanding and acceptance.     Dental Advisory Given  Plan Discussed with: CRNA  Anesthesia Plan Comments:        Anesthesia Quick Evaluation

## 2022-05-19 NOTE — Progress Notes (Signed)
SLP Cancellation Note  Patient Details Name: Courtney Zuniga MRN: [id number] DOB: 1951/01/20   Cancelled treatment:       Reason Eval/Treat Not Completed: Patient at procedure or test/unavailable (Pt off floor for EGD.)  Thank you,  Genene Churn, [place]  [place] 05/19/2022, 11:33 AM

## 2022-05-20 ENCOUNTER — Other Ambulatory Visit: Payer: Medicare Other

## 2022-05-20 ENCOUNTER — Telehealth: Payer: Self-pay | Admitting: Gastroenterology

## 2022-05-20 ENCOUNTER — Inpatient Hospital Stay: Payer: 59 | Admitting: Dietician

## 2022-05-20 ENCOUNTER — Inpatient Hospital Stay: Payer: 59 | Admitting: Physician Assistant

## 2022-05-20 ENCOUNTER — Ambulatory Visit: Payer: 59 | Admitting: Physician Assistant

## 2022-05-20 ENCOUNTER — Ambulatory Visit: Payer: Medicare Other

## 2022-05-20 ENCOUNTER — Ambulatory Visit: Payer: Self-pay

## 2022-05-20 ENCOUNTER — Inpatient Hospital Stay: Payer: 59

## 2022-05-20 ENCOUNTER — Other Ambulatory Visit: Payer: Self-pay

## 2022-05-20 ENCOUNTER — Ambulatory Visit: Payer: Medicare Other | Admitting: Internal Medicine

## 2022-05-20 DIAGNOSIS — E43 Unspecified severe protein-calorie malnutrition: Secondary | ICD-10-CM | POA: Diagnosis not present

## 2022-05-20 DIAGNOSIS — K5520 Angiodysplasia of colon without hemorrhage: Secondary | ICD-10-CM

## 2022-05-20 DIAGNOSIS — K259 Gastric ulcer, unspecified as acute or chronic, without hemorrhage or perforation: Secondary | ICD-10-CM

## 2022-05-20 DIAGNOSIS — D649 Anemia, unspecified: Secondary | ICD-10-CM | POA: Diagnosis not present

## 2022-05-20 DIAGNOSIS — K922 Gastrointestinal hemorrhage, unspecified: Secondary | ICD-10-CM | POA: Diagnosis not present

## 2022-05-20 DIAGNOSIS — C3491 Malignant neoplasm of unspecified part of right bronchus or lung: Secondary | ICD-10-CM | POA: Diagnosis not present

## 2022-05-20 DIAGNOSIS — D62 Acute posthemorrhagic anemia: Secondary | ICD-10-CM | POA: Diagnosis not present

## 2022-05-20 LAB — BASIC METABOLIC PANEL
Anion gap: 7 (ref 5–15)
BUN: 14 mg/dL (ref 8–23)
CO2: 28 mmol/L (ref 22–32)
Calcium: 8.3 mg/dL — ABNORMAL LOW (ref 8.9–10.3)
Chloride: 102 mmol/L (ref 98–111)
Creatinine, Ser: 0.55 mg/dL (ref 0.44–1.00)
GFR, Estimated: >60 mL/min (ref >60)
Glucose, Bld: 118 mg/dL — ABNORMAL HIGH (ref 70–99)
Potassium: 4.4 mmol/L (ref 3.5–5.1)
Sodium: 137 mmol/L (ref 135–145)

## 2022-05-20 LAB — CBC
HCT: 26.8 % — ABNORMAL LOW (ref 36.0–46.0)
Hemoglobin: 8.5 g/dL — ABNORMAL LOW (ref 12.0–15.0)
MCH: 26.1 pg (ref 26.0–34.0)
MCHC: 31.7 g/dL (ref 30.0–36.0)
MCV: 82.2 fL (ref 80.0–100.0)
Platelets: 346 10*3/uL (ref 150–400)
RBC: 3.26 MIL/uL — ABNORMAL LOW (ref 3.87–5.11)
RDW: 17.4 % — ABNORMAL HIGH (ref 11.5–15.5)
WBC: 7.5 10*3/uL (ref 4.0–10.5)
nRBC: 0.3 % — ABNORMAL HIGH (ref 0.0–0.2)

## 2022-05-20 MED ORDER — PANTOPRAZOLE SODIUM 40 MG PO TBEC
40.0000 mg | DELAYED_RELEASE_TABLET | Freq: Two times a day (BID) | ORAL | Status: DC
  Administered 2022-05-20 – 2022-05-21 (×2): 40 mg via ORAL
  Filled 2022-05-20 (×2): qty 1

## 2022-05-20 MED ORDER — IPRATROPIUM-ALBUTEROL 0.5-2.5 (3) MG/3ML IN SOLN
3.0000 mL | Freq: Three times a day (TID) | RESPIRATORY_TRACT | Status: DC
  Administered 2022-05-20 – 2022-05-21 (×4): 3 mL via RESPIRATORY_TRACT
  Filled 2022-05-20 (×4): qty 3

## 2022-05-20 NOTE — Progress Notes (Signed)
Pt assisted to bedside commode. Complains of pain 7/10 in abdomen. Pt repositioned and pain medication provided.

## 2022-05-20 NOTE — Telephone Encounter (Signed)
Courtney Zuniga, patient is currently admitted to the hospital.  We are signing off.  She needs follow-up in 2 weeks in the clinic.

## 2022-05-20 NOTE — Anesthesia Postprocedure Evaluation (Signed)
Anesthesia Post Note  Patient: Courtney Zuniga  Procedure(s) Performed: ESOPHAGOGASTRODUODENOSCOPY (EGD) WITH PROPOFOL HOT HEMOSTASIS (ARGON PLASMA COAGULATION/BICAP)  Patient location during evaluation: Phase II Anesthesia Type: General Level of consciousness: awake Pain management: pain level controlled Vital Signs Assessment: post-procedure vital signs reviewed and stable Respiratory status: spontaneous breathing and respiratory function stable Cardiovascular status: blood pressure returned to baseline and stable Postop Assessment: no headache and no apparent nausea or vomiting Anesthetic complications: no Comments: Late entry   No notable events documented.   Last Vitals:  Vitals:   05/20/22 0201 05/20/22 0433  BP:  117/74  Pulse:  (!) 105  Resp:  18  Temp:  37.4 C  SpO2: 95% 100%    Last Pain:  Vitals:   05/19/22 2157  TempSrc:   PainSc: [place]

## 2022-05-20 NOTE — Progress Notes (Signed)
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    She is reporting that a few days ago she started spontaneously having severe sharp gastric pains and discomfort.  She started drinking milk to try and coat her stomach on the inside.  She also noticed having black stools.  She reports that the pain has subsequently subsided.  She has been having progressive weakness especially with ambulating short distances.  She denies fever and chills.  She has chronic cough symptoms.  Patient denies nausea vomiting and diarrhea.  EMS reported that patient had a desaturation of pulse ox to 76% with ambulation.  They placed her on a nonrebreather and noted patient had labored breathing.  In the ED she was noted to have acute drop in hemoglobin of 11 a month ago down to 7.5.  She was seen to have a black stool that tested Hemoccult positive.  GI was consulted and recommended patient be kept n.p.o. after midnight in anticipation of possible upper endoscopy.  Patient is being transfused 1 unit PRBC.  Her chest x-ray shows findings of bilateral pneumonia as she has been started on antibiotic coverage.  Admission requested for further management.   Assessment and Plan:  Acute Upper GI bleed -marked drop in Hg from a month ago -agree with transfusing 1 unit of PRBC -following Hg -IV protonix ordered BID -GI consult with plan for EGD on 2/5 -EGD 2/5 --  findings of gastric ulcers and multiple AVMs ablated    Bilateral Pneumonia  -findings concerning for aspiration  -SLP evaluation and dysphagia diet ordered -continue IV unasyn as ordered x 3 days treatment total -continue supportive measures    Acute on chronic respiratory failure with hypoxia  -pt required nonrebreather -likely exacerbated by ABLA -wean oxygen back down to regular 4L/min as able (baseline requirement) -treating pneumonia and COPD exacerbation    COPD with acute exacerbation  -secondary to pneumonia, resolving recent viral infection  -continue antibiotics, steroids and bronchodilators  -supplemental oxygen with goal pulse ox >87%   Gastric Lesion  - discussed with GI, 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 - EGD per GI service  - protonix BID ordered    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    Objective: Vitals:   05/20/22 0758 05/20/22 1128 05/20/22 1218 05/20/22 1348  BP:   124/78   Pulse: (!) 112 (!) 109 [place] Kitchen)  105 (!) 112  Resp: 16 17 20 18   Temp:   99.1 F (37.3 C)   TempSrc:   Oral   SpO2: 98% 98% 97%   Weight:      Height:        Intake/Output Summary (Last 24 hours) at 05/20/2022 1657 Last data filed at 05/20/2022 0900 Gross per 24 hour  Intake 700 ml  Output 1101 ml  Net -401 ml   Filed Weights   05/18/22 1238 05/19/22 1051 05/20/22 0433  Weight: 51.5 kg 51.5 kg 58.4 kg    Examination:  General exam: cachectic frail female, awake, alert, Appears calm and comfortable  Respiratory system: Clear to auscultation. No wheeze heard. Respiratory effort normal. Cardiovascular system: normal S1 & S2 heard. Tachycardic rate, No JVD, murmurs, rubs, gallops or clicks. No pedal edema. Gastrointestinal system: Abdomen is nondistended, soft and nontender. No organomegaly or masses felt. Normal bowel sounds heard. Central nervous system: Alert and oriented. No focal neurological deficits. Extremities: Symmetric 5 x 5 power. Skin: No rashes, lesions or ulcers. Psychiatry: Judgement and insight appear normal. Mood & affect appropriate.   Data Reviewed: I have personally reviewed following labs and imaging studies  CBC: Recent Labs  Lab 05/18/22 1305 05/19/22 0449 05/20/22 0521  WBC 7.5 5.6 7.5  NEUTROABS 6.2  --   --   HGB 7.5* 8.7* 8.5*  HCT 23.3* 27.6* 26.8*  MCV 79.8* 81.9 82.2  PLT 391 354 008    Basic Metabolic Panel: Recent Labs  Lab 05/18/22 1305 05/19/22 0449 05/20/22 0521  NA 137 139 137  K 3.9 5.0 4.4  CL 102 104 102  CO2 27 29 28   GLUCOSE 108* 130* 118*  BUN 11 13 14   CREATININE 0.51 0.44 0.55  CALCIUM 8.5* 8.3* 8.3*  MG 1.8 1.8  --     CBG: No results for input(s): "GLUCAP" in the last 168 hours.  Recent Results (from the past 240 hour(s))  Blood Culture (routine x 2)     Status: None (Preliminary result)   Collection Time: 05/18/22  1:40 PM   Specimen: BLOOD RIGHT ARM  Result Value Ref Range Status   Specimen Description   Final    BLOOD RIGHT ARM BOTTLES DRAWN AEROBIC AND ANAEROBIC   Special Requests Blood Culture adequate volume  Final   Culture   Final    NO GROWTH 2 DAYS Performed at [facility], [address] [place], [place] [address]    Report Status PENDING  Incomplete  Blood Culture (routine x 2)     Status: None (Preliminary result)   Collection Time: 05/18/22  1:40 PM   Specimen: BLOOD LEFT ARM  Result Value Ref  Range Status   Specimen Description BLOOD LEFT ARM BOTTLES DRAWN AEROBIC AND ANAEROBIC  Final   Special Requests Blood Culture adequate volume  Final   Culture   Final    NO GROWTH 2 DAYS Performed at [facility], [address] [place], [place] [address]    Report Status PENDING  Incomplete     Radiology Studies: No results found.  Scheduled Meds:  Chlorhexidine Gluconate Cloth  6 each Topical Daily   DULoxetine  60 mg Oral BID   feeding supplement  237 mL Oral TID BM   fluticasone furoate-vilanterol  1 puff Inhalation Daily   ipratropium-albuterol  3 mL Nebulization TID   megestrol  400 mg Oral Daily   methylPREDNISolone (SOLU-MEDROL) injection  40 mg Intravenous Q12H   pantoprazole  40 mg  Intravenous Q12H   saccharomyces boulardii  250 mg Oral BID   tamsulosin  0.4 mg Oral QPC supper   topiramate  25 mg Oral BID   Continuous Infusions:  ampicillin-sulbactam (UNASYN) IV 3 g (05/20/22 1606)   lactated ringers 10 mL/hr at 05/20/22 1111     LOS: 2 days   Time spent: 35 mins  Courtney Zuniga Wynetta Emery, MD How to contact the [facility] Attending or Consulting provider [place] or covering provider during after hours [place], for this patient?  Check the care team in [facility] and look for a) attending/consulting TR[facility] provider listed and b) the [facility] team listed Log into [email or web address] and use [place]'s universal password to access. If you do not have the password, please contact the hospital operator. Locate the [facility] provider you are looking for under [facility] and page to a number that you can be directly reached. If you still have difficulty reaching the provider, please page the [facility] (Director on Call) for the Hospitalists listed on amion for assistance.  05/20/2022, 4:57 PM

## 2022-05-20 NOTE — Plan of Care (Signed)
  Problem: Coping: Goal: Level of anxiety will decrease Outcome: Progressing   Problem: Elimination: Goal: Will not experience complications related to bowel motility Outcome: Progressing   Problem: Pain Managment: Goal: General experience of comfort will improve Outcome: Progressing   Problem: Safety: Goal: Ability to remain free from injury will improve Outcome: Progressing   Problem: Skin Integrity: Goal: Risk for impaired skin integrity will decrease Outcome: Progressing   Problem: Clinical Measurements: Goal: Respiratory complications will improve Outcome: Progressing

## 2022-05-20 NOTE — Progress Notes (Signed)
Mobility Specialist Progress Note:    05/20/22 1359  Mobility  Activity Ambulated with assistance in room  Level of Assistance Contact guard assist, steadying assist  Assistive Device Front wheel walker  Distance Ambulated (ft) 15 ft  Activity Response Tolerated well  Mobility Referral Yes  $Mobility charge 1 Mobility   Pt was eager to transfer to chair. Pt requested to use BSC first, then transfer to recliner. Tolerated well, asx throughout. MinG required for safety. Left pt in chair with alarm on, all needs met.  Royetta Crochet Mobility Specialist Please contact via Solicitor or  Rehab office at [phone]

## 2022-05-20 NOTE — Evaluation (Signed)
Clinical/Bedside Swallow Evaluation Patient Details  Name: Courtney Zuniga MRN: [id number] Date of Birth: 1950/06/27  Today's Date: 05/20/2022 Time: SLP Start Time (ACUTE ONLY): 65 SLP Stop Time (ACUTE ONLY): 1419 SLP Time Calculation (min) (ACUTE ONLY): 34 min  Past Medical History:  Past Medical History:  Diagnosis Date   Anemia    as a young woman   Arthritis    osteoartritis   Asthma    Brain tumor (benign) ([place]) 2005 [facility]   Benign   Chronic headaches    Chronic hip pain    Chronic pain    COPD (chronic obstructive pulmonary disease) (HCC)    Coronary artery disease    Depression    Depression 05/15/2016   Encounter for antineoplastic chemotherapy 01/10/2016   GERD (gastroesophageal reflux disease)    Hypertension    Lung cancer ([place]) dx'd 01/2016   currently on chemo and radiation    NSTEMI (non-ST elevated myocardial infarction) ([place]) yrs ago   On home O2    qhs 2 liters at hs and prn   Pneumonia last time 2 yrs ago   Shortness of breath dyspnea    with activity   Past Surgical History:  Past Surgical History:  Procedure Laterality Date   CHOLECYSTECTOMY     COLONOSCOPY  2015   Results requested from [facility]   COLONOSCOPY     ESOPHAGOGASTRODUODENOSCOPY N/A 08/14/2015   Procedure: ESOPHAGOGASTRODUODENOSCOPY (EGD);  Surgeon: Daneil Dolin, MD;  [facility];  Service: Endoscopy;  Laterality: N/A;  215    ESOPHAGOGASTRODUODENOSCOPY (EGD) WITH PROPOFOL N/A 09/13/2015   Procedure: ESOPHAGOGASTRODUODENOSCOPY (EGD) WITH PROPOFOL;  Surgeon: Milus Banister, MD;  Location: [facility];  Service: Endoscopy;  Laterality: N/A;   EUS N/A 03/12/2017   Procedure: UPPER ENDOSCOPIC ULTRASOUND (EUS) RADIAL;  Surgeon: Milus Banister, MD;  Location: [facility];  Service: Endoscopy;  Laterality: N/A;   IR IMAGING GUIDED PORT INSERTION  01/20/2022   TUMOR REMOVAL  2005   Benign   UPPER ESOPHAGEAL ENDOSCOPIC ULTRASOUND (EUS)  09/13/2015   Procedure: UPPER  ESOPHAGEAL ENDOSCOPIC ULTRASOUND (EUS);  Surgeon: Milus Banister, MD;  Location: [facility];  Service: Endoscopy;;   VIDEO BRONCHOSCOPY WITH ENDOBRONCHIAL NAVIGATION N/A 12/31/2015   Procedure: VIDEO BRONCHOSCOPY WITH ENDOBRONCHIAL NAVIGATION;  Surgeon: Melrose Nakayama, MD;  Location: [place];  Service: Thoracic;  Laterality: N/A;   VIDEO BRONCHOSCOPY WITH ENDOBRONCHIAL ULTRASOUND N/A 11/08/2015   Procedure: VIDEO BRONCHOSCOPY WITH ENDOBRONCHIAL ULTRASOUND;  Surgeon: Ivin Poot, MD;  Location: [place];  Service: Thoracic;  Laterality: N/A;   HPI:  Courtney Zuniga is a 72 year old female with a history of non-small cell lung cancer of the right lung with recurrence, GERD, depression, COPD, chronic hypoxic respiratory failure requiring 4 L nasal cannula at all times, hypertension, former smoker, anemia, NSTEMI, chronic hip pain,, headache, benign brain tumor, gastric tumor who was discharged last month from [facility] with COPD exacerbation and influenza B infection.       She is reporting that a few days ago she started spontaneously having severe sharp gastric pains and discomfort.  She started drinking milk to try and coat her stomach on the inside.  She also noticed having black stools.  She reports that the pain has subsequently subsided.  She has been having progressive weakness especially with ambulating short distances.  She denies fever and chills.  She has chronic cough symptoms.  Patient denies nausea vomiting and diarrhea. GI was consulted and recommended patient be  kept n.p.o. after midnight 05/19/2022 and upper endoscopy was completed by Dr. Abbey Chatters with recommendation to resume soft/thin diet per chart review.  Patient is being transfused 1 unit PRBC.  Her chest x-ray shows findings of bilateral pneumonia as she has been started on antibiotic coverage.  Admission requested for further management. BSE requested.    Assessment / Plan / Recommendation  Clinical Impression  Clinical  swallowing evaluation completed while Pt was sitting upright in chair. Oral mechanism reveals Left facial tremors reportedly onset after surgical removal of benign brain tumor in 2005. Pt reports frequent coughing after drinking liquids and shortness of breath during PO. Pt consumed thin liquids via cup and puree and solid trials without overt s/sx of aspiration. Thin liquids with a straw resulted in a delayed wet cough after every sip. Pt has many risk factors including weight loss, PNA, hx of esophageal related issues and compromised respiratory status. Discussed above with GI, Dr. Cherlynn June and recommend consider MBSS to r/o silent aspiration and objectively visualize thin liquids and NTL under imaging. At this time recommend continue with D3/soft diet and thin liquids with NO STRAW. Recommend meds whole with puree. ST will continue to follow acutely, thank you. SLP Visit Diagnosis: Dysphagia, unspecified (R13.10)    Aspiration Risk  Mild aspiration risk;Risk for inadequate nutrition/hydration    Diet Recommendation Dysphagia 3 (Mech soft);Thin liquid (NO STRAWS)   Liquid Administration via: No straw Medication Administration: Whole meds with puree Supervision: Patient able to self feed;Intermittent supervision to cue for compensatory strategies Compensations: Minimize environmental distractions;Slow rate;Small sips/bites Postural Changes: Seated upright at 90 degrees    Other  Recommendations Oral Care Recommendations: Oral care BID    Recommendations for follow up therapy are one component of a multi-disciplinary discharge planning process, led by the attending physician.  Recommendations may be updated based on patient status, additional functional criteria and insurance authorization.  Follow up Recommendations Skilled nursing-short term rehab (<3 hours/day)      Assistance Recommended at Discharge    Functional Status Assessment    Frequency and Duration min 1 x/week  1 week        Prognosis Prognosis for improved oropharyngeal function: [place] Date of Onset: 05/18/22 HPI: Courtney Zuniga is a 72 year old female with a history of non-small cell lung cancer of the right lung with recurrence, GERD, depression, COPD, chronic hypoxic respiratory failure requiring 4 L nasal cannula at all times, hypertension, former smoker, anemia, NSTEMI, chronic hip pain,, headache, benign brain tumor, gastric tumor who was discharged last month from [facility] with COPD exacerbation and influenza B infection.       She is reporting that a few days ago she started spontaneously having severe sharp gastric pains and discomfort.  She started drinking milk to try and coat her stomach on the inside.  She also noticed having black stools.  She reports that the pain has subsequently subsided.  She has been having progressive weakness especially with ambulating short distances.  She denies fever and chills.  She has chronic cough symptoms.  Patient denies nausea vomiting and diarrhea. GI was consulted and recommended patient be kept n.p.o. after midnight 05/19/2022 and upper endoscopy was completed by Dr. Abbey Chatters with recommendation to resume soft/thin diet per chart review.  Patient is being transfused 1 unit PRBC.  Her chest x-ray shows findings of bilateral pneumonia as she has been started on antibiotic coverage.  Admission requested for further management. BSE  requested. Type of Study: Bedside Swallow Evaluation Previous Swallow Assessment: none in chart Diet Prior to this Study: Dysphagia 3 (mechanical soft);Thin liquids (Level 0) Temperature Spikes Noted: No Respiratory Status: Nasal cannula History of Recent Intubation: No Behavior/Cognition: Alert;Cooperative;Pleasant mood Oral Cavity Assessment: Within Functional Limits Oral Care Completed by SLP: Recent completion by staff Oral Cavity - Dentition: Dentures, top;Dentures, bottom Vision: Functional for  self-feeding Self-Feeding Abilities: Able to feed self Patient Positioning: Upright in chair Baseline Vocal Quality: Breathy;Normal Volitional Cough: Strong;Congested Volitional Swallow: Able to elicit    Oral/Motor/Sensory Function Overall Oral Motor/Sensory Function: Mild impairment Facial ROM: Suspected CN VII (facial) dysfunction   Ice Chips Ice chips: Within functional limits   Thin Liquid Thin Liquid: Impaired Presentation: Straw;Cup Oral Phase Impairments: Reduced labial seal Oral Phase Functional Implications: Right anterior spillage;Left anterior spillage Pharyngeal  Phase Impairments: Multiple swallows;Wet Vocal Quality;Cough - Delayed    Nectar Thick Nectar Thick Liquid: Within functional limits   Honey Thick Honey Thick Liquid: Not tested   Puree Puree: Within functional limits   Solid     Solid: Within functional limits     Courtney Zuniga H. Roddie Mc, CCC-SLP Speech Language Pathologist  Wende Bushy 05/20/2022,2:36 PM

## 2022-05-20 NOTE — Progress Notes (Signed)
Subjective: Feels a little more short winded today. Occurs when getting up to use the bathroom on bedside commode. Also with a little increased non-productive cough. No chest pain. No abdominal pain, nausea, vomiting. Last BM was yesterday evening and was black. No brbpr.    O2 saturation 98% while in the room. Tachycardic at 115.   Objective: Vital signs in last 24 hours: Temp:  [98 F (36.7 C)-99.3 F (37.4 C)] 99.3 F (37.4 C) (02/06 0433) Pulse Rate:  [91-112] 112 (02/06 0758) Resp:  [16-35] 16 (02/06 0758) BP: (103-117)/(70-74) 117/74 (02/06 0433) SpO2:  [93 %-100 %] 98 % (02/06 0758) Weight:  [58.4 kg] 58.4 kg (02/06 0433) Last BM Date : 05/19/22 General:  Alert and oriented, pleasant, NAD.  Lungs: Clear to auscultation bilaterally. On 4L nasal cannula O2.  Abdomen:  Bowel sounds present, soft, non-tender, non-distended. No HSM or hernias noted. No rebound or guarding. No masses appreciated  Extremities:  Without edema. Neurologic:  Alert and  oriented x4;  grossly normal neurologically. Skin:  Warm and dry, intact without significant lesions.  Cervical Nodes:  No significant cervical adenopathy. Psych: Normal mood and affect.  Intake/Output from previous day: 02/05 0701 - 02/06 0700 In: 1346 [P.O.:360; I.V.:686; IV Piggyback:300] Out: 1801 [Urine:1801] Intake/Output this shift: Total I/O In: 240 [P.O.:240] Out: -   Lab Results: Recent Labs    05/18/22 1305 05/19/22 0449 05/20/22 0521  WBC 7.5 5.6 7.5  HGB 7.5* 8.7* 8.5*  HCT 23.3* 27.6* 26.8*  PLT 391 354 346   BMET Recent Labs    05/18/22 1305 05/19/22 0449 05/20/22 0521  NA 137 139 137  K 3.9 5.0 4.4  CL 102 104 102  CO2 27 29 28   GLUCOSE 108* 130* 118*  BUN 11 13 14   CREATININE 0.51 0.44 0.55  CALCIUM 8.5* 8.3* 8.3*   LFT Recent Labs    05/18/22 1305  PROT 6.4*  ALBUMIN 2.9*  AST 17  ALT 13  ALKPHOS 218*  BILITOT 0.3   PT/INR Recent Labs    05/18/22 1305  LABPROT 13.8  INR  1.1    Studies/Results: DG Chest Port 1 View  Result Date: 05/18/2022 CLINICAL DATA:  Weakness, possible sepsis EXAM: PORTABLE CHEST 1 VIEW COMPARISON:  04/23/2022 FINDINGS: Severe emphysema. Chronic right middle lobe scarring. Increased airspace opacity in both lower lobes. Blunting of the right costophrenic angle suggesting small right pleural effusion. Chronically accentuated interstitium in the lungs. Power injectable right Port-A-Cath tip: SVC. Atherosclerotic calcification of the aortic arch. Prominent main pulmonary artery compatible with pulmonary arterial hypertension. Heart size within normal limits. Thoracic spondylosis. Atherosclerotic calcification of the aortic arch. IMPRESSION: 1. Increased airspace opacity in both lower lobes, suspicious for pneumonia or aspiration pneumonitis. 2. Small right pleural effusion. 3. Severe emphysema. 4. Prominent main pulmonary artery compatible with pulmonary arterial hypertension. 5. Chronic scarring and volume loss in the right middle lobe. 6. Thoracic spondylosis. Electronically Signed   By: Van Clines M.D.   On: 05/18/2022 13:45    Assessment:  72 y.o. 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 Admitted with bilateral pneumonia, and GI consulted due to heme positive stool and acute on  chronic anemia.   Acute on chronic anemia:  Hgb 7.5 on presentation, down from 10-11 range several weeks ago.  Admitted to doing aspirin powers and recent ibuprofen use and concern for melena.  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   Received 1 unit PRBCs on 2/4 with hemoglobin improved to 8.7 yesterday, stable at 8.5 today.  Reports 1 black stool yesterday evening after procedure.  Suspect she may have some washout over the next couple of days. Will need to continue to monitor H/H.  Shortness of breath:  Some increased shortness of breath today with reports of non-productive cough.  Currently on 4 L oxygen supplementation via nasal cannula which is at her baseline.  O2 saturations 98% while in the room, HR elevated at 115.  Lungs sound clear. Shortness of breath is in the setting of bilateral pneumonia and acute on chronic anemia. BNP also elevated this admission and she had small right pleural effusion noted on 2/4. There was some concern for aspiration pneumonia on imaging this admission and SLP evaluation is pending.  She is currently on IV Unasyn for pneumonia. Hemoglobin is stable today at 8.5.   Will defer further evaluation/management to hospitalist. I have notified hospitalist of patients report of worsening SOB and cough.    Plan: Continue PPI twice daily for at least 12 weeks. Will need to consider repeat EGD in 3 months to verify ulcer healing if overall health permits.  Avoid all NSAIDs. Continue to monitor H&H and transfuse as needed. Management of pneumonia per hospitalist. I notified Dr. Wynetta Emery of patient's reports of increased SOB and cough.  GI will sign off. Please re-consult if needed. We will arrange outpatient follow-up.    LOS: 2 days    05/20/2022, 10:52 AM   Aliene Altes, PA-C [facility]

## 2022-05-21 ENCOUNTER — Ambulatory Visit: Payer: Medicare Other

## 2022-05-21 ENCOUNTER — Ambulatory Visit: Payer: Medicare Other | Admitting: Internal Medicine

## 2022-05-21 ENCOUNTER — Other Ambulatory Visit: Payer: Medicare Other

## 2022-05-21 DIAGNOSIS — J441 Chronic obstructive pulmonary disease with (acute) exacerbation: Secondary | ICD-10-CM | POA: Diagnosis not present

## 2022-05-21 DIAGNOSIS — D649 Anemia, unspecified: Secondary | ICD-10-CM

## 2022-05-21 DIAGNOSIS — J189 Pneumonia, unspecified organism: Secondary | ICD-10-CM | POA: Diagnosis not present

## 2022-05-21 DIAGNOSIS — K922 Gastrointestinal hemorrhage, unspecified: Secondary | ICD-10-CM | POA: Diagnosis not present

## 2022-05-21 LAB — CBC
HCT: 28.5 % — ABNORMAL LOW (ref 36.0–46.0)
Hemoglobin: 9.1 g/dL — ABNORMAL LOW (ref 12.0–15.0)
MCH: 25.9 pg — ABNORMAL LOW (ref 26.0–34.0)
MCHC: 31.9 g/dL (ref 30.0–36.0)
MCV: 81.2 fL (ref 80.0–100.0)
Platelets: 375 10*3/uL (ref 150–400)
RBC: 3.51 MIL/uL — ABNORMAL LOW (ref 3.87–5.11)
RDW: 17.8 % — ABNORMAL HIGH (ref 11.5–15.5)
WBC: 7.7 10*3/uL (ref 4.0–10.5)
nRBC: 0 % (ref 0.0–0.2)

## 2022-05-21 LAB — BASIC METABOLIC PANEL
Anion gap: 10 (ref 5–15)
BUN: 15 mg/dL (ref 8–23)
CO2: 28 mmol/L (ref 22–32)
Calcium: 8.8 mg/dL — ABNORMAL LOW (ref 8.9–10.3)
Chloride: 98 mmol/L (ref 98–111)
Creatinine, Ser: 0.46 mg/dL (ref 0.44–1.00)
GFR, Estimated: >60 mL/min (ref >60)
Glucose, Bld: 111 mg/dL — ABNORMAL HIGH (ref 70–99)
Potassium: 4.4 mmol/L (ref 3.5–5.1)
Sodium: 136 mmol/L (ref 135–145)

## 2022-05-21 MED ORDER — AMOXICILLIN-POT CLAVULANATE 875-125 MG PO TABS
1.0000 | ORAL_TABLET | Freq: Two times a day (BID) | ORAL | Status: DC
  Administered 2022-05-21: 1 via ORAL
  Filled 2022-05-21: qty 1

## 2022-05-21 MED ORDER — METOPROLOL TARTRATE 25 MG PO TABS
12.5000 mg | ORAL_TABLET | Freq: Two times a day (BID) | ORAL | Status: DC
  Administered 2022-05-21: 12.5 mg via ORAL
  Filled 2022-05-21: qty 1

## 2022-05-21 MED ORDER — PANTOPRAZOLE SODIUM 40 MG PO TBEC: 40.0000 mg | DELAYED_RELEASE_TABLET | Freq: Two times a day (BID) | ORAL | 1 refills | Status: DC

## 2022-05-21 MED ORDER — AMOXICILLIN-POT CLAVULANATE 875-125 MG PO TABS: 1.0000 | ORAL_TABLET | Freq: Two times a day (BID) | ORAL | 0 refills | Status: DC

## 2022-05-21 MED ORDER — PREDNISONE 20 MG PO TABS: 50.0000 mg | ORAL_TABLET | Freq: Every day | ORAL | Status: DC

## 2022-05-21 MED ORDER — PREDNISONE 50 MG PO TABS: 50.0000 mg | ORAL_TABLET | Freq: Every day | ORAL | 0 refills | Status: DC

## 2022-05-21 NOTE — Discharge Summary (Addendum)
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    She is reporting that a few days ago she started spontaneously having severe sharp gastric pains and discomfort.  She started drinking milk to try and coat her stomach on the inside.  She also noticed having black stools.  She reports that the pain has subsequently subsided.  She has been having progressive weakness especially with ambulating short distances.  She denies fever and chills.  She has chronic cough symptoms.  Patient denies nausea vomiting and diarrhea.  EMS reported that patient had a desaturation of pulse ox to 76% with ambulation.  They placed her on a nonrebreather and noted patient had labored breathing.  In the ED she was noted to have acute drop in hemoglobin of 11 a month ago down to 7.5.  She was seen to have a black stool that tested Hemoccult positive.  GI was consulted and recommended patient be kept n.p.o. after midnight in anticipation of possible upper endoscopy.  Patient is being transfused 1 unit PRBC.  Her chest x-ray shows findings of bilateral pneumonia as she has been started on antibiotic coverage.  Admission requested for further management.  Assessment and Plan:  Acute Upper GI bleed/Acute Blood Loss  Anemia -marked drop in Hg from a month ago -transfused 1 unit of PRBC during admission -following Hgb>>stable since transfusion;  Hgb 9.1 on day of dc -IV protonix ordered BID -GI consult with plan for EGD on 2/5 -EGD 2/5 -- findings of gastric ulcers and multiple AVMs tx with APC   Bilateral Pneumonia  -findings concerning for aspiration  -SLP evaluation and dysphagia diet ordered -continue IV unasyn as ordered>>dc home with amox/clav x 4 more days -continue supportive measures    Acute on chronic respiratory failure with hypoxia  -pt required nonrebreather -likely exacerbated by ABLA -wean oxygen back down to regular 4L/min as able (baseline requirement) -treating pneumonia and COPD exacerbation  -pt on 4L at home which pt is on at time of d/c   COPD with acute exacerbation  -secondary to pneumonia, resolving recent viral infection  -continue antibiotics, steroids and bronchodilators  -supplemental oxygen with goal pulse ox >87% -d/c home with prednisone x 3 more days   Gastric Lesion  - discussed with GI, 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 - EGD per GI service as above - protonix BID ordered  -out pt follow up with repeat EGD   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 - chemotherapy per oncologist  - needs goals of care discussion on outpatient basis - s/p novolumab oncology  - s/p radiation    Adult failure to thrive/Severe protein calorie malnutrition   - Goals of care need to be an ongoing discussion - pt seems to be in denial of how serious her condition is -  nutritional supplements per dietitian       Consultants: none Procedures performed: EGD 2/5  Disposition: Home Diet recommendation:  Regular diet--dys 3 with thin liquid DISCHARGE MEDICATION: Allergies as of 05/21/2022       Reactions   Desyrel [trazodone]    "Made me faint"        Medication List     STOP taking these medications    cyclobenzaprine 10 MG tablet Commonly known as: FLEXERIL    oseltamivir 30 MG capsule Commonly known as: TAMIFLU       TAKE these medications    acetaminophen 325 MG tablet Commonly known as: TYLENOL Take 2 tablets (650 mg total) by mouth every 6 (six) hours as needed for mild pain, fever or headache.   albuterol 108 (90 Base) MCG/ACT inhaler Commonly known as: VENTOLIN HFA Inhale 2 puffs into the lungs every 4 (four) hours as needed for shortness of breath.   amoxicillin-clavulanate 875-125 MG tablet Commonly known as: AUGMENTIN Take 1 tablet by mouth every 12 (twelve) hours.   Combivent Respimat 20-100 MCG/ACT Aers respimat Generic drug: Ipratropium-Albuterol Inhale 1 puff into the lungs every 6 (six) hours as needed for wheezing or shortness of breath.   ipratropium-albuterol 0.5-2.5 (3) MG/3ML Soln Commonly known as: DUONEB Take 3 mLs by nebulization every 6 (six) hours as needed.   DULoxetine 60 MG capsule Commonly known as: CYMBALTA Take 1 capsule (60 mg total) by mouth 2 (two) times daily.   Ensure Take 237 mLs by mouth 3 (three) times daily between meals.   fluticasone furoate-vilanterol 200-25 MCG/ACT Aepb Commonly known as: BREO ELLIPTA Inhale 1 puff into the lungs daily.   megestrol 625 MG/5ML suspension Commonly known as: Megace ES Take 5 mLs (625 mg total) by mouth daily.   metoprolol tartrate 25 MG tablet Commonly known as: LOPRESSOR Take 1 tablet (25 mg total) by mouth 2 (two) times daily.   oxyCODONE-acetaminophen 5-325 MG tablet Commonly known as: PERCOCET/ROXICET Take 1 tablet by mouth every 8 (eight) hours as needed for moderate pain.   pantoprazole 40 MG tablet Commonly known as: PROTONIX Take 1 tablet (40 mg total) by mouth 2 (two) times daily. What changed: when to take this   predniSONE 50 MG tablet Commonly known as: DELTASONE Take 1 tablet (50 mg total) by mouth daily with breakfast. Start taking on: May 22, 2022   prochlorperazine 10 MG tablet Commonly known as: COMPAZINE Take 1  tablet (10 mg total) by mouth every 6 (six) hours as needed for nausea or vomiting.   tamsulosin 0.4 MG Caps capsule Commonly known as: FLOMAX Take 1 capsule (0.4 mg total) by mouth daily after supper.   topiramate 25 MG tablet Commonly known as: Topamax Take 1 tablet (25 mg total) by mouth 2 (two) times daily.   Trelegy Ellipta 100-62.5-25 MCG/ACT Aepb Generic drug: Fluticasone-Umeclidin-Vilant Inhale 1 puff into the lungs daily.   Vitamin D (Ergocalciferol) 1.25 MG (50000 UNIT) Caps capsule Commonly known as: DRISDOL Take 1 capsule (50,000 Units total) by mouth once a week.        Discharge Exam: Filed Weights   05/19/22 1051 05/20/22 0433 05/21/22 0517  Weight: 51.5 kg 58.4 kg 53 kg   HEENT:  [place]/AT, No thrush, no icterus CV:  RRR, no rub, no S3, no S4 Lung: diminished BS.  Bibasilar rales. No wheeze Abd:  soft/+BS, NT Ext:  No edema, no lymphangitis, no synovitis, no rash   Condition at discharge: stable  The results of significant  diagnostics from this hospitalization (including imaging, microbiology, ancillary and laboratory) are listed below for reference.   Imaging Studies: DG Chest Port 1 View  Result Date: 05/18/2022 CLINICAL DATA:  Weakness, possible sepsis EXAM: PORTABLE CHEST 1 VIEW COMPARISON:  04/23/2022 FINDINGS: Severe emphysema. Chronic right middle lobe scarring. Increased airspace opacity in both lower lobes. Blunting of the right costophrenic angle suggesting small right pleural effusion. Chronically accentuated interstitium in the lungs. Power injectable right Port-A-Cath tip: SVC. Atherosclerotic calcification of the aortic arch. Prominent main pulmonary artery compatible with pulmonary arterial hypertension. Heart size within normal limits. Thoracic spondylosis. Atherosclerotic calcification of the aortic arch. IMPRESSION: 1. Increased airspace opacity in both lower lobes, suspicious for pneumonia or aspiration pneumonitis. 2. Small right pleural  effusion. 3. Severe emphysema. 4. Prominent main pulmonary artery compatible with pulmonary arterial hypertension. 5. Chronic scarring and volume loss in the right middle lobe. 6. Thoracic spondylosis. Electronically Signed   By: Van Clines M.D.   On: 05/18/2022 13:45   LONG TERM MONITOR (3-14 DAYS)  Result Date: 05/06/2022 Event monitor analysis time:6 days and 3 hours Rhythm: Patient had a min HR of 76 bpm, max HR of 171 bpm, and avg HR of 107 bpm. Predominant underlying rhythm was Sinus Rhythm. AV block/Pauses: None Afib/flutter: None SVT:1 brief run of SVT occurred lasting 5 beats with a max rate of 171 bpm (avg 152 bpm). YT:KZSW PAC:<1% PVC:1.2% Patient events: None Conclusion: Normal sinus rhythm with no evidence of pauses or malignant atrial/ventricular arrhythmias. Elevated heart rates > 90 bpm consistently throughout the day. Average HR is high, 107 bpm. PVC burden is 1.2%.  DG Chest Port 1 View  Result Date: 04/23/2022 CLINICAL DATA:  Shortness of breath. History of COPD and lung cancer. EXAM: PORTABLE CHEST 1 VIEW COMPARISON:  CT chest 01/28/2022 and radiographs 12/14/2021 FINDINGS: Accessed right chest wall Port-A-Cath tip in the low SVC. Stable cardiomediastinal silhouette. Aortic atherosclerotic calcification. Hyperinflation. Chronic bronchitic change and interstitial coarsening. Bibasilar atelectasis/scarring biapical pleural-parenchymal scarring. No focal consolidation, pleural effusion, or pneumothorax. IMPRESSION: No focal pneumonia.  COPD. Electronically Signed   By: Placido Sou M.D.   On: 04/23/2022 03:09    Microbiology: Results for orders placed or performed during the hospital encounter of 05/18/22  Blood Culture (routine x 2)     Status: None (Preliminary result)   Collection Time: 05/18/22  1:40 PM   Specimen: BLOOD RIGHT ARM  Result Value Ref Range Status   Specimen Description   Final    BLOOD RIGHT ARM BOTTLES DRAWN AEROBIC AND ANAEROBIC   Special Requests  Blood Culture adequate volume  Final   Culture   Final    NO GROWTH 2 DAYS Performed at [facility], [address], [place], [place] [address]    Report Status PENDING  Incomplete  Blood Culture (routine x 2)     Status: None (Preliminary result)   Collection Time: 05/18/22  1:40 PM   Specimen: BLOOD LEFT ARM  Result Value Ref Range Status   Specimen Description BLOOD LEFT ARM BOTTLES DRAWN AEROBIC AND ANAEROBIC  Final   Special Requests Blood Culture adequate volume  Final   Culture   Final    NO GROWTH 2 DAYS Performed at [facility], [address] [place], [place] [address]    Report Status PENDING  Incomplete    Labs: CBC: Recent Labs  Lab 05/18/22 1305 05/19/22 0449 05/20/22 0521 05/21/22 0453  WBC 7.5 5.6 7.5 7.7  NEUTROABS 6.2  --   --   --  HGB 7.5* 8.7* 8.5* 9.1*  HCT 23.3* 27.6* 26.8* 28.5*  MCV 79.8* 81.9 82.2 81.2  PLT 391 354 346 034   Basic Metabolic Panel: Recent Labs  Lab 05/18/22 1305 05/19/22 0449 05/20/22 0521 05/21/22 0453  NA 137 139 137 136  K 3.9 5.0 4.4 4.4  CL 102 104 102 98  CO2 27 29 28 28   GLUCOSE 108* 130* 118* 111*  BUN 11 13 14 15   CREATININE 0.51 0.44 0.55 0.46  CALCIUM 8.5* 8.3* 8.3* 8.8*  MG 1.8 1.8  --   --    Liver Function Tests: Recent Labs  Lab 05/18/22 1305  AST 17  ALT 13  ALKPHOS 218*  BILITOT 0.3  PROT 6.4*  ALBUMIN 2.9*   CBG: No results for input(s): "GLUCAP" in the last 168 hours.  Discharge time spent: greater than 30 minutes.  Signed: Orson Eva, MD [facility] 05/21/2022

## 2022-05-21 NOTE — Evaluation (Signed)
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       She is reporting that a few days ago she started spontaneously having severe sharp gastric pains and discomfort.  She started drinking milk to try and coat her stomach on the inside.  She also noticed having black stools.  She reports that the pain has subsequently subsided.  She has been having progressive weakness especially with ambulating short distances.  She denies fever and chills.  She has chronic cough symptoms.  Patient denies nausea vomiting and diarrhea.   Clinical Impression  Patient functioning near baseline for functional mobility and gait demonstrating good return for ambulating in room without loss of balance, on 4 LPM O2 with SpO2 at 95% and limited mostly due to increased HR up to 140's and c/o fatigue. Patient tolerated sitting up in chair after therapy.  PLAN:  Patient to be discharged home today and discharged from acute physical therapy to care of nursing for ambulation as tolerated for length of stay with recommendations stated below          Recommendations for follow up therapy are one component of a multi-disciplinary discharge planning process, led by the attending physician.  Recommendations may be updated based on patient status, additional functional criteria and insurance authorization.  Follow Up Recommendations Home health PT      Assistance Recommended at Discharge Set up  Supervision/Assistance  Patient can return home with the following  A little help with walking and/or transfers;A little help with bathing/dressing/bathroom;Help with stairs or ramp for entrance;Assistance with cooking/housework    Equipment Recommendations None recommended by PT  Recommendations for Other Services       Functional Status Assessment Patient has had a recent decline in their functional status and demonstrates the ability to make significant improvements in function in a reasonable and predictable amount of time.     Precautions / Restrictions Precautions Precautions: None Restrictions Weight Bearing Restrictions: No      Mobility  Bed Mobility Overal bed mobility: Modified Independent                  Transfers Overall transfer level: Modified independent                      Ambulation/Gait Ambulation/Gait assistance: Modified independent (Device/Increase time), Supervision Gait Distance (Feet): 65 Feet Assistive device: None Gait Pattern/deviations: WFL(Within Functional Limits) Gait velocity: decreased     General Gait Details: grossly WFL with good return for ambulation in room and hallway without loss of balance, on 4 LPM O2 with SpO2 at 95%, limited mostly due to fatigue and increasing HR up to 140 BPM  Stairs            Wheelchair Mobility    Modified Rankin (Stroke Patients Only)       Balance Overall balance assessment: No apparent balance deficits (not formally assessed)  Pertinent Vitals/Pain Pain Assessment Pain Assessment: No/denies pain    Home Living Family/patient expects to be discharged to:: Private residence Living Arrangements: Children   Type of Home: House Home Access: Stairs to enter Entrance Stairs-Rails: Left Entrance Stairs-Number of Steps: 3   Home Layout: One level Home Equipment: Conservation officer, nature (2 wheels);Shower seat Additional  Comments: 4 LPM O2 at home    Prior Function Prior Level of Function : Independent/Modified Independent             Mobility Comments: Household and short distanced community ambulator without use of AD, on 4 LPM O2 at home ADLs Comments: Assisted by family     Hand Dominance        Extremity/Trunk Assessment   Upper Extremity Assessment Upper Extremity Assessment: Overall WFL for tasks assessed    Lower Extremity Assessment Lower Extremity Assessment: Generalized weakness    Cervical / Trunk Assessment Cervical / Trunk Assessment: Normal  Communication   Communication: No difficulties  Cognition Arousal/Alertness: Awake/alert Behavior During Therapy: WFL for tasks assessed/performed Overall Cognitive Status: Within Functional Limits for tasks assessed                                          General Comments      Exercises     Assessment/Plan    PT Assessment All further PT needs can be met in the next venue of care  PT Problem List Decreased strength;Decreased activity tolerance;Decreased balance;Decreased mobility       PT Treatment Interventions      PT Goals (Current goals can be found in the Care Plan section)  Acute Rehab PT Goals Patient Stated Goal: return home with family to assist PT Goal Formulation: With patient Time For Goal Achievement: 05/21/22 Potential to Achieve Goals: Good    Frequency       Co-evaluation               AM-PAC PT "6 Clicks" Mobility  Outcome Measure Help needed turning from your back to your side while in a flat bed without using bedrails?: None Help needed moving from lying on your back to sitting on the side of a flat bed without using bedrails?: None Help needed moving to and from a bed to a chair (including a wheelchair)?: None Help needed standing up from a chair using your arms (e.g., wheelchair or bedside chair)?: None Help needed to walk in hospital room?: A Little Help needed  climbing 3-5 steps with a railing? : A Little 6 Click Score: 22    End of Session Equipment Utilized During Treatment: Oxygen Activity Tolerance: Patient tolerated treatment well;Patient limited by fatigue Patient left: in chair;with call bell/phone within reach;with nursing/sitter in room Nurse Communication: Mobility status PT Visit Diagnosis: Unsteadiness on feet (R26.81);Other abnormalities of gait and mobility (R26.89);Muscle weakness (generalized) (M62.81)    Time: 3254-9826 PT Time Calculation (min) (ACUTE ONLY): 21 min   Charges:   PT Evaluation $PT Eval Moderate Complexity: 1 Mod PT Treatments $Therapeutic Activity: 8-22 mins      2:03 PM, 05/21/22 Lonell Grandchild, MPT Physical Therapist with [facility] [phone] office [phone] mobile phone

## 2022-05-22 ENCOUNTER — Encounter: Payer: Self-pay | Admitting: Gastroenterology

## 2022-05-22 NOTE — Telephone Encounter (Signed)
Noted  

## 2022-05-23 ENCOUNTER — Encounter (HOSPITAL_COMMUNITY): Payer: Self-pay | Admitting: Internal Medicine

## 2022-05-24 LAB — CULTURE, BLOOD (ROUTINE X 2)
Culture: NO GROWTH
Culture: NO GROWTH
Special Requests: ADEQUATE
Special Requests: ADEQUATE

## 2022-06-07 NOTE — Progress Notes (Deleted)
Referring Provider: [name], FNP Primary Care Physician:  [name], FNP Primary GI Physician: Dr. Gala Romney  No chief complaint on file.   HPI:   Courtney Zuniga is a 72 y.o. 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  She was admitted in early February with bilateral pneumonia, acute on chronic anemia with heme positive stool.  Hemoglobin was down to 7.5 on presentation, previously 10-11 range.  Reported black stool. Admitted to chronic use of Bcs for headaches.  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  She received 1 unit PRBCs on 2/4 with hemoglobin improved to 9.1 by the time of discharge.  She was treated with PPI twice daily with recommendations to consider repeat EGD in 3 months if overall health permits.  Today:     Last colonoscopy in 2010.  She had an essentially normal exam aside from diverticulosis in the sigmoid colon, but considerable amount of stool was present.  Recommended repeating colonoscopy in 5 years.  Past Medical History:  Diagnosis Date   Anemia    as a young woman   Arthritis    osteoartritis   Asthma    Brain tumor (benign) ([place]) 2005 [facility]   Benign   Chronic headaches    Chronic hip pain    Chronic pain    COPD (chronic obstructive pulmonary disease) (HCC)    Coronary artery disease    Depression    Depression 05/15/2016   Encounter for antineoplastic chemotherapy 01/10/2016   GERD (gastroesophageal reflux disease)    Hypertension     Lung cancer ([place]) dx'd 01/2016   currently on chemo and radiation    NSTEMI (non-ST elevated myocardial infarction) ([place]) yrs ago   On home O2    qhs 2 liters at hs and prn   Pneumonia last time 2 yrs ago   Shortness of breath dyspnea    with activity    Past Surgical History:  Procedure Laterality Date   CHOLECYSTECTOMY     COLONOSCOPY  2015   Results requested from [facility]   COLONOSCOPY     ESOPHAGOGASTRODUODENOSCOPY N/A 08/14/2015   Procedure: ESOPHAGOGASTRODUODENOSCOPY (EGD);  Surgeon: Daneil Dolin, MD;  [facility]: [facility];  Service: Endoscopy;  Laterality: N/A;  215    ESOPHAGOGASTRODUODENOSCOPY (EGD) WITH PROPOFOL N/A 09/13/2015   Procedure: ESOPHAGOGASTRODUODENOSCOPY (EGD) WITH PROPOFOL;  Surgeon: Milus Banister, MD;  Location: [facility];  Service: Endoscopy;  Laterality: N/A;   ESOPHAGOGASTRODUODENOSCOPY (EGD) WITH PROPOFOL N/A 05/19/2022   Procedure: ESOPHAGOGASTRODUODENOSCOPY (EGD) WITH PROPOFOL;  Surgeon: Eloise Harman, DO;  [facility];  Service: Endoscopy;  Laterality: N/A;   EUS N/A 03/12/2017   Procedure: UPPER ENDOSCOPIC ULTRASOUND (EUS) RADIAL;  Surgeon: Milus Banister, MD;  Location: [facility];  Service: Endoscopy;  Laterality: N/A;   HOT HEMOSTASIS  05/19/2022   Procedure: HOT HEMOSTASIS (ARGON PLASMA COAGULATION/BICAP);  Surgeon: Eloise Harman, DO;  [facility];  Service: Endoscopy;;   IR IMAGING GUIDED PORT INSERTION  01/20/2022   TUMOR REMOVAL  2005  Benign   UPPER ESOPHAGEAL ENDOSCOPIC ULTRASOUND (EUS)  09/13/2015   Procedure: UPPER ESOPHAGEAL ENDOSCOPIC ULTRASOUND (EUS);  Surgeon: Milus Banister, MD;  Location: [facility];  Service: Endoscopy;;   VIDEO BRONCHOSCOPY WITH ENDOBRONCHIAL NAVIGATION N/A 12/31/2015   Procedure: VIDEO BRONCHOSCOPY WITH ENDOBRONCHIAL NAVIGATION;  Surgeon: Melrose Nakayama, MD;  Location: [place];  Service: Thoracic;  Laterality: N/A;   VIDEO BRONCHOSCOPY WITH ENDOBRONCHIAL  ULTRASOUND N/A 11/08/2015   Procedure: VIDEO BRONCHOSCOPY WITH ENDOBRONCHIAL ULTRASOUND;  Surgeon: Ivin Poot, MD;  Location: [facility];  Service: Thoracic;  Laterality: N/A;    Current Outpatient Medications  Medication Sig Dispense Refill   acetaminophen (TYLENOL) 325 MG tablet Take 2 tablets (650 mg total) by mouth every 6 (six) hours as needed for mild pain, fever or headache. 12 tablet 2   albuterol (VENTOLIN HFA) 108 (90 Base) MCG/ACT inhaler Inhale 2 puffs into the lungs every 4 (four) hours as needed for shortness of breath. 18 g 3   amoxicillin-clavulanate (AUGMENTIN) 875-125 MG tablet Take 1 tablet by mouth every 12 (twelve) hours. 8 tablet 0   DULoxetine (CYMBALTA) 60 MG capsule Take 1 capsule (60 mg total) by mouth 2 (two) times daily. 180 capsule 3   ENSURE (ENSURE) Take 237 mLs by mouth 3 (three) times daily between meals.     fluticasone furoate-vilanterol (BREO ELLIPTA) 200-25 MCG/ACT AEPB Inhale 1 puff into the lungs daily. 60 each 4   Ipratropium-Albuterol (COMBIVENT RESPIMAT) 20-100 MCG/ACT AERS respimat Inhale 1 puff into the lungs every 6 (six) hours as needed for wheezing or shortness of breath. 4 g 0   ipratropium-albuterol (DUONEB) 0.5-2.5 (3) MG/3ML SOLN Take 3 mLs by nebulization every 6 (six) hours as needed. 360 mL 2   megestrol (MEGACE ES) 625 MG/5ML suspension Take 5 mLs (625 mg total) by mouth daily. (Patient not taking: Reported on 05/19/2022) 150 mL 0   metoprolol tartrate (LOPRESSOR) 25 MG tablet Take 1 tablet (25 mg total) by mouth 2 (two) times daily. 60 tablet 3   oxyCODONE-acetaminophen (PERCOCET/ROXICET) 5-325 MG tablet Take 1 tablet by mouth every 8 (eight) hours as needed for moderate pain. 12 tablet 0   pantoprazole (PROTONIX) 40 MG tablet Take 1 tablet (40 mg total) by mouth 2 (two) times daily. 60 tablet 1   predniSONE (DELTASONE) 50 MG tablet Take 1 tablet (50 mg total) by mouth daily with breakfast. 3 tablet 0   prochlorperazine (COMPAZINE) 10 MG tablet  Take 1 tablet (10 mg total) by mouth every 6 (six) hours as needed for nausea or vomiting. 30 tablet 0   tamsulosin (FLOMAX) 0.4 MG CAPS capsule Take 1 capsule (0.4 mg total) by mouth daily after supper. 30 capsule 3   topiramate (TOPAMAX) 25 MG tablet Take 1 tablet (25 mg total) by mouth 2 (two) times daily. 45 tablet 2   TRELEGY ELLIPTA 100-62.5-25 MCG/ACT AEPB Inhale 1 puff into the lungs daily. 60 each 3   Vitamin D, Ergocalciferol, (DRISDOL) 1.25 MG (50000 UNIT) CAPS capsule Take 1 capsule (50,000 Units total) by mouth once a week. 5 capsule 3   No current facility-administered medications for this visit.    Allergies as of 06/09/2022 - Review Complete 05/19/2022  Allergen Reaction Noted   Desyrel [trazodone]  07/17/2021    Family History  Problem Relation Age of Onset   Ovarian cancer Mother    Lung cancer Father    Brain cancer Sister        36 sister   Lung cancer  Brother    Lung cancer Brother    Prostate cancer Brother    Colon cancer Neg Hx    Colon polyps Neg Hx     Social History   Socioeconomic History   Marital status: Single    Spouse name: Not on file   Number of children: Not on file   Years of education: 10th grade   Highest education level: Not on file  Occupational History   Occupation: retired  Tobacco Use   Smoking status: Former    Packs/day: 1.00    Years: 38.00    Total pack years: 38.00    Types: Cigarettes    Quit date: 07/26/2018    Years since quitting: 3.8   Smokeless tobacco: Never   Tobacco comments:    Pt has asked doctor for med to help   Vaping Use   Vaping Use: Never used  Substance and Sexual Activity   Alcohol use: No    Alcohol/week: 0.0 standard drinks of alcohol   Drug use: No   Sexual activity: Not Currently  Other Topics Concern   Not on file  Social History Narrative   Not on file   Social Determinants of Health   Financial Resource Strain: Low Risk  (05/05/2018)   Overall Financial Resource Strain (CARDIA)     Difficulty of Paying Living Expenses: Not very hard  Food Insecurity: No Food Insecurity (05/18/2022)   Hunger Vital Sign    Worried About Running Out of Food in the Last Year: Never true    [place] in the Last Year: Never true  Transportation Needs: No Transportation Needs (05/18/2022)   PRAPARE - Hydrologist (Medical): No    Lack of Transportation (Non-Medical): No  Physical Activity: Insufficiently Active (05/05/2018)   Exercise Vital Sign    Days of Exercise per Week: 5 days    Minutes of Exercise per Session: 10 min  Stress: No Stress Concern Present (05/05/2018)   [place]    Feeling of Stress : Not at all  Social Connections: Somewhat Isolated (05/05/2018)   Social Connection and Isolation Panel [NHANES]    Frequency of Communication with Friends and Family: More than three times a week    Frequency of Social Gatherings with Friends and Family: More than three times a week    Attends Religious Services: More than 4 times per year    Active Member of [employer] or Organizations: No    Attends Archivist Meetings: Never    Marital Status: Never married    Review of Systems: Gen: Denies fever, chills, anorexia. Denies fatigue, weakness, weight loss.  CV: Denies chest pain, palpitations, syncope, peripheral edema, and claudication. Resp: Denies dyspnea at rest, cough, wheezing, coughing up blood, and pleurisy. GI: Denies vomiting blood, jaundice, and fecal incontinence.   Denies dysphagia or odynophagia. Derm: Denies rash, itching, dry skin Psych: Denies depression, anxiety, memory loss, confusion. No homicidal or suicidal ideation.  Heme: Denies bruising, bleeding, and enlarged lymph nodes.  Physical Exam: There were no vitals taken for this visit. General:   Alert and oriented. No distress noted. Pleasant and cooperative.  Head:  Normocephalic and atraumatic. Eyes:   Conjuctiva clear without scleral icterus. Heart:  S1, S2 present without murmurs appreciated. Lungs:  Clear to auscultation bilaterally. No wheezes, rales, or rhonchi. No distress.  Abdomen:  +BS, soft, non-tender and non-distended. No rebound or guarding. No HSM or  masses noted. Msk:  Symmetrical without gross deformities. Normal posture. Extremities:  Without edema. Neurologic:  Alert and  oriented x4 Psych:  Normal mood and affect.    Assessment:     Plan:  ***   Aliene Altes, PA-C [facility] 06/09/2022

## 2022-06-09 ENCOUNTER — Encounter: Payer: Self-pay | Admitting: Gastroenterology

## 2022-06-09 ENCOUNTER — Inpatient Hospital Stay: Payer: 59 | Admitting: Gastroenterology

## 2022-06-10 ENCOUNTER — Other Ambulatory Visit: Payer: Self-pay | Admitting: Physician Assistant

## 2022-06-10 DIAGNOSIS — D649 Anemia, unspecified: Secondary | ICD-10-CM

## 2022-06-10 NOTE — Progress Notes (Unsigned)
[facility] OFFICE PROGRESS NOTE  Courtney Zuniga, Courtney Zuniga [id number]  DIAGNOSIS: Recurrent non-small cell lung cancer initially diagnosed as stage IIIA (T1b, N2, M0) non-small cell lung cancer presented with right middle lobe pulmonary nodule, mediastinal lymphadenopathy and highly suspicious for small nodule in the left upper lobe that could change her stage to stage IV that could present another synchronous primary lesion in the left upper lobe. This was diagnosed in September 2017.    PRIOR THERAPY: 1) Concurrent chemoradiation with weekly carboplatin for AUC of 2 and paclitaxel 45 MG/M2 status post 6 cycles last dose was given 02/25/2016 with partial response. 2) Consolidation chemotherapy with carboplatin for AUC of 5 and paclitaxel 175 MG/M2 every 3 weeks with Neulasta support. First dose 04/22/2016. Status post 3 cycles. 3) Second line treatment with immunotherapy with Nivolumab 480 mg IV every 4 weeks status post 32 cycles.  Discontinued secondary to intolerance and frequent hospitalization with pneumonia and pneumonitis.  She had a break of treatment between November 24, 2019 until June 28, 2020.  CURRENT THERAPY: : Resuming her treatment with immunotherapy with nivolumab 480 mg IV every 4 weeks.  Cycle #33 started on June 28, 2020.  Status post 55 cycles.    INTERVAL HISTORY: Courtney Zuniga 72 y.o. female returns to the clinic today for a follow up visit. The patient has not been seen in the clinic since 03/27/23.  In the interval since last being seen, the patient was hospitalized for a COPD exacerbation secondary to influenza in January 2024. She then was hospitalized again in February 2024 after presenting to the emergency room for generalized weakness, fatigue, and exertional shortness of breath.  She was also hypoxic during ambulation with a oxygen of 76%.  At baseline, she wears 4 L of supplemental oxygen.  During her hospital admission, she was  admitted until 05/21/2022. She was having severe epigastric pain and black stool prior to admission with progressive weakness and dyspnea on exertion.  She had acute drop in her hemoglobin of 11 a month prior to 7.5.  She had hemoccult testing performed that was positive.  She received 1 unit of blood.  X-ray findings showed bilateral pneumonia and she was started on antibiotics.  She had an EGD on 2/5 which showed gastric ulcers and multiple AVMs which was treated with APC.   Of note, the patient has chronic headaches and does take BC powder frequently.  She is now aware not to take [facility] powder or ibuprofen.  The patient states she is expected to follow-up at GI in [place] but has not had an appointment yet.  She is not on any iron supplements.  Denies any abnormal bleeding or bruising at this time and denies any abdominal pain.  She is also scheduled to see her pulmonologist on March 12.  She is still on 4 L of supplemental oxygen due to chronic respiratory failure.  The patient has not had a restaging CT scan to check on her malignancy since October 2023.  Her main complaint today is related to insomnia.  She has tried melatonin and Tylenol PM in the past.  She has some hypotension today.  She states she tries to drink water at home.  She is not taking any antihypertensives.  The patient denies any fever, chills, or night sweats.  She thinks that she is starting to get back to eating better.  She is scheduled to see member the nutritionist team while in the infusion  room today.  She is still on Megace for her appetite.  She denies any nausea, vomiting, diarrhea, or constipation.  Denies any rashes or skin changes.  She is here today for evaluation and repeat blood work before considering resuming her treatment with cycle #56.   MEDICAL HISTORY: Past Medical History:  Diagnosis Date   Anemia    as a young woman   Arthritis    osteoartritis   Asthma    Brain tumor (benign) ([place]) 2005 [facility]   Benign    Chronic headaches    Chronic hip pain    Chronic pain    COPD (chronic obstructive pulmonary disease) (HCC)    Coronary artery disease    Depression    Depression 05/15/2016   Encounter for antineoplastic chemotherapy 01/10/2016   GERD (gastroesophageal reflux disease)    Hypertension    Lung cancer ([place]) dx'd 01/2016   currently on chemo and radiation    NSTEMI (non-ST elevated myocardial infarction) ([place]) yrs ago   On home O2    qhs 2 liters at hs and prn   Pneumonia last time 2 yrs ago   Shortness of breath dyspnea    with activity    ALLERGIES:  is allergic to desyrel [trazodone].  MEDICATIONS:  Current Outpatient Medications  Medication Sig Dispense Refill   acetaminophen (TYLENOL) 325 MG tablet Take 2 tablets (650 mg total) by mouth every 6 (six) hours as needed for mild pain, fever or headache. 12 tablet 2   albuterol (VENTOLIN HFA) 108 (90 Base) MCG/ACT inhaler Inhale 2 puffs into the lungs every 4 (four) hours as needed for shortness of breath. 18 g 3   amoxicillin-clavulanate (AUGMENTIN) 875-125 MG tablet Take 1 tablet by mouth every 12 (twelve) hours. 8 tablet 0   DULoxetine (CYMBALTA) 60 MG capsule Take 1 capsule (60 mg total) by mouth 2 (two) times daily. 180 capsule 3   ENSURE (ENSURE) Take 237 mLs by mouth 3 (three) times daily between meals.     FeFum-FePoly-FA-B Cmp-C-Biot (INTEGRA PLUS) CAPS Take 1 capsule by mouth every morning. 30 capsule 2   fluticasone furoate-vilanterol (BREO ELLIPTA) 200-25 MCG/ACT AEPB Inhale 1 puff into the lungs daily. 60 each 4   Ipratropium-Albuterol (COMBIVENT RESPIMAT) 20-100 MCG/ACT AERS respimat Inhale 1 puff into the lungs every 6 (six) hours as needed for wheezing or shortness of breath. 4 g 0   ipratropium-albuterol (DUONEB) 0.5-2.5 (3) MG/3ML SOLN Take 3 mLs by nebulization every 6 (six) hours as needed. 360 mL 2   megestrol (MEGACE ES) 625 MG/5ML suspension Take 5 mLs (625 mg total) by mouth daily. 150 mL 0    oxyCODONE-acetaminophen (PERCOCET/ROXICET) 5-325 MG tablet Take 1 tablet by mouth every 8 (eight) hours as needed for moderate pain. 12 tablet 0   pantoprazole (PROTONIX) 40 MG tablet Take 1 tablet (40 mg total) by mouth 2 (two) times daily. 60 tablet 1   predniSONE (DELTASONE) 50 MG tablet Take 1 tablet (50 mg total) by mouth daily with breakfast. 3 tablet 0   prochlorperazine (COMPAZINE) 10 MG tablet Take 1 tablet (10 mg total) by mouth every 6 (six) hours as needed for nausea or vomiting. 30 tablet 0   tamsulosin (FLOMAX) 0.4 MG CAPS capsule Take 1 capsule (0.4 mg total) by mouth daily after supper. 30 capsule 3   topiramate (TOPAMAX) 25 MG tablet Take 1 tablet (25 mg total) by mouth 2 (two) times daily. 45 tablet 2   TRELEGY ELLIPTA 100-62.5-25 MCG/ACT AEPB Inhale 1  puff into the lungs daily. 60 each 3   Vitamin D, Ergocalciferol, (DRISDOL) 1.25 MG (50000 UNIT) CAPS capsule Take 1 capsule (50,000 Units total) by mouth once a week. 5 capsule 3   metoprolol tartrate (LOPRESSOR) 25 MG tablet Take 1 tablet (25 mg total) by mouth 2 (two) times daily. 60 tablet 3   No current facility-administered medications for this visit.    SURGICAL HISTORY:  Past Surgical History:  Procedure Laterality Date   CHOLECYSTECTOMY     COLONOSCOPY  2015   Results requested from [facility]   COLONOSCOPY     ESOPHAGOGASTRODUODENOSCOPY N/A 08/14/2015   Procedure: ESOPHAGOGASTRODUODENOSCOPY (EGD);  Surgeon: Daneil Dolin, MD;  Location: [facility];  Service: Endoscopy;  Laterality: N/A;  215    ESOPHAGOGASTRODUODENOSCOPY (EGD) WITH PROPOFOL N/A 09/13/2015   Procedure: ESOPHAGOGASTRODUODENOSCOPY (EGD) WITH PROPOFOL;  Surgeon: Milus Banister, MD;  Location: [facility] ENDOS[facility];  Service: Endoscopy;  Laterality: N/A;   ESOPHAGOGASTRODUODENOSCOPY (EGD) WITH PROPOFOL N/A 05/19/2022   Procedure: ESOPHAGOGASTRODUODENOSCOPY (EGD) WITH PROPOFOL;  Surgeon: Eloise Harman, DO;  Location: [facility];  Service:  Endoscopy;  Laterality: N/A;   EUS N/A 03/12/2017   Procedure: UPPER ENDOSCOPIC ULTRASOUND (EUS) RADIAL;  Surgeon: Milus Banister, MD;  Location: [facility] END[facility];  Service: Endoscopy;  Laterality: N/A;   HOT HEMOSTASIS  05/19/2022   Procedure: HOT HEMOSTASIS (ARGON PLASMA COAGULATION/BICAP);  Surgeon: Eloise Harman, DO;  [facility];  Service: Endoscopy;;   IR IMAGING GUIDED PORT INSERTION  01/20/2022   TUMOR REMOVAL  2005   Benign   UPPER ESOPHAGEAL ENDOSCOPIC ULTRASOUND (EUS)  09/13/2015   Procedure: UPPER ESOPHAGEAL ENDOSCOPIC ULTRASOUND (EUS);  Surgeon: Milus Banister, MD;  Location: [facility];  Service: Endoscopy;;   VIDEO BRONCHOSCOPY WITH ENDOBRONCHIAL NAVIGATION N/A 12/31/2015   Procedure: VIDEO BRONCHOSCOPY WITH ENDOBRONCHIAL NAVIGATION;  Surgeon: Melrose Nakayama, MD;  Location: [facility];  Service: Thoracic;  Laterality: N/A;   VIDEO BRONCHOSCOPY WITH ENDOBRONCHIAL ULTRASOUND N/A 11/08/2015   Procedure: VIDEO BRONCHOSCOPY WITH ENDOBRONCHIAL ULTRASOUND;  Surgeon: Ivin Poot, MD;  Location: [facility] OR;  Service: Thoracic;  Laterality: N/A;    REVIEW OF SYSTEMS:   Constitutional: Positive for baseline fatigue.  Negative for chills, fever and unexpected weight change.  HENT: Negative for mouth sores, nosebleeds, sore throat and trouble swallowing.  Eyes: Negative for eye problems and icterus.  Respiratory: Positive for baseline dyspnea with minimal exertion.  Negative for hemoptysis, shortness of breath and wheezing.   Cardiovascular: Negative for chest pain and leg swelling.  Gastrointestinal: Negative for abdominal pain, constipation, diarrhea, nausea and vomiting.  Genitourinary: Negative for bladder incontinence, difficulty urinating, dysuria, frequency and hematuria.   Musculoskeletal: Negative for back pain, gait problem, neck pain and neck stiffness.  Skin: Negative for itching and rash.  Neurological:  Positive for frequent headaches. Negative for dizziness,  headache, extremity weakness, gait problem, light-headedness and seizures.  Hematological: Negative for adenopathy. Does not bruise/bleed easily.  Psychiatric/Behavioral: Negative for confusion, depression and sleep disturbance. The patient is not nervous/anxious.   PHYSICAL EXAMINATION:  Blood pressure (!) 89/59, pulse 84, temperature 97.8 F (36.6 C), resp. rate 18, weight 114 lb 14.4 oz (52.1 kg), SpO2 92 %.  ECOG PERFORMANCE STATUS: 2-3  Physical Exam  Constitutional: Oriented to person, place, and time and w thin appearing female and in no distress.  HENT:  Head: Normocephalic and atraumatic.  Mouth/Throat: Oropharynx is clear and moist. No oropharyngeal exudate.  Eyes: Conjunctivae are normal. Right  eye exhibits no discharge. Left eye exhibits no discharge. No scleral icterus.  Neck: Normal range of motion. Neck supple.  Cardiovascular: Normal rate, regular rhythm, normal heart sounds and intact distal pulses.   Pulmonary/Chest: Effort normal.  Quiet breath sounds in all lung fields.  No respiratory distress. No wheezes. No rales. On supplemental oxygen. Abdominal: Soft. Bowel sounds are normal. Exhibits no distension and no mass. There is no tenderness.  Musculoskeletal: Normal range of motion. Exhibits no edema.  Lymphadenopathy:    No cervical adenopathy.  Neurological: Left facial twitching and weakness (baseline from brain tumor in 2005-stable/chronic). Alert and oriented to person, place, and time.  Exhibits muscle wasting.  The patient was examined in the wheelchair.  Skin: Skin is warm and dry. No rash noted. Not diaphoretic. No erythema. No pallor.  Psychiatric: Mood, memory and judgment normal.  Vitals reviewed.    LABORATORY DATA: Lab Results  Component Value Date   WBC 6.5 06/12/2022   HGB 9.4 (L) 06/12/2022   HCT 28.0 (L) 06/12/2022   MCV 75.7 (L) 06/12/2022   PLT 350 06/12/2022      Chemistry      Component Value Date/Time   NA 141 06/12/2022 0928   NA  144 04/09/2017 0948   K 3.9 06/12/2022 0928   K 4.3 04/09/2017 0948   CL 109 06/12/2022 0928   CO2 26 06/12/2022 0928   CO2 25 04/09/2017 0948   BUN 13 06/12/2022 0928   BUN 16.8 04/09/2017 0948   CREATININE 0.45 06/12/2022 0928   CREATININE 0.8 04/09/2017 0948      Component Value Date/Time   CALCIUM 8.5 (L) 06/12/2022 0928   CALCIUM 9.1 04/09/2017 0948   ALKPHOS 240 (H) 06/12/2022 0928   ALKPHOS 216 (H) 04/09/2017 0948   AST 12 (L) 06/12/2022 0928   AST 12 04/09/2017 0948   ALT 5 06/12/2022 0928   ALT 13 04/09/2017 0948   BILITOT 0.2 (L) 06/12/2022 0928   BILITOT 0.22 04/09/2017 0948       RADIOGRAPHIC STUDIES:  DG Chest Port 1 View  Result Date: 05/18/2022 CLINICAL DATA:  Weakness, possible sepsis EXAM: PORTABLE CHEST 1 VIEW COMPARISON:  04/23/2022 FINDINGS: Severe emphysema. Chronic right middle lobe scarring. Increased airspace opacity in both lower lobes. Blunting of the right costophrenic angle suggesting small right pleural effusion. Chronically accentuated interstitium in the lungs. Power injectable right Port-A-Cath tip: SVC. Atherosclerotic calcification of the aortic arch. Prominent main pulmonary artery compatible with pulmonary arterial hypertension. Heart size within normal limits. Thoracic spondylosis. Atherosclerotic calcification of the aortic arch. IMPRESSION: 1. Increased airspace opacity in both lower lobes, suspicious for pneumonia or aspiration pneumonitis. 2. Small right pleural effusion. 3. Severe emphysema. 4. Prominent main pulmonary artery compatible with pulmonary arterial hypertension. 5. Chronic scarring and volume loss in the right middle lobe. 6. Thoracic spondylosis. Electronically Signed   By: Van Clines M.D.   On: 05/18/2022 13:45     ASSESSMENT/PLAN:  This is a very pleasant 72 year old African-American female with recurrent metastatic non-small cell lung cancer, adenocarcinoma of the right middle lobe.  She was initially diagnosed as  stage IIIa in September 2017.     She completed concurrent chemoradiation followed by consolidation with carboplatin and paclitaxel. She is status post 3 cycles. The patient was on observation but a restaging CT scan showed evidence of disease progression.   She is currently undergoing treatment with second line with immunotherapy with nivolumab 480 mg IV every 4 weeks.  She was status  post 31 cycles. Her treatment had been on hold since August 2021 until her COPD can get under control. She showed evidence of some recurrent disease in March 2022 and she resumed her treatment with cycle number cycle #33 of single agent nivolumab 480 mg IV every 4 weeks in March 2022 status post 55 cycles.  The patient was recently hospitalized for COPD exacerbation secondary to influenza.   The patient was seen with Dr. Julien Nordmann today.  Labs were reviewed.  Her hemoglobin is stable.  Her MCV is low at 75.  I am checking her iron studies today but   recommended that she start taking iron supplement.  I sent a prescription for Integra plus to her pharmacy and she was instructed to take this with orange juice.  R Dr. Julien Nordmann recommended that she take a break from treatment to allow her more time to recover from her recent hospitalizations and deconditioning.    Will arrange for restaging CT scan of the chest, abdomen, pelvis and see her for follow-up in 2 weeks.  If her scan is stable, we will discuss treatment break at that time.    Since she is hypotensive today, we will arrange for IV fluids to be given in the infusion room instead.  He was instructed to monitor her blood pressure closely at home.  If she has hypotension, I encouraged her to call us to see if she needs additional IV fluids.    I also encouraged her to follow-up with her PCP after being discharged in the hospital.  I encouraged her to talk to them about medications for insomnia.  Is also going to follow-up with her pulmonologist on 06/24/2022 and with  her gastroenterologist in [place].    I discussed symptoms of worsening anemia and she was advised to seek medical evaluation and repeat blood work should she develop any abnormal signs of bleeding or worsening fatigue, shortness of breath, lightheadedness, etc.    She will continue taking megace.   The patient was advised to call immediately if she has any concerning symptoms in the interval. The patient voices understanding of current disease status and treatment options and is in agreement with the current care plan. All questions were answered. The patient knows to call the clinic with any problems, questions or concerns. We can certainly see the patient much sooner if necessary  Orders Placed This Encounter  Procedures   CT Chest W Contrast    Standing Status:   Future    Standing Expiration Date:   06/12/2023    Order Specific Question:   If indicated for the ordered procedure, I authorize the administration of contrast media per Radiology protocol    Answer:   Yes    Order Specific Question:   Does the patient have a contrast media/X-ray dye allergy?    Answer:   No    Order Specific Question:   Preferred imaging location?    Answer:   [facility]   CT Abdomen Pelvis W Contrast    Standing Status:   Future    Standing Expiration Date:   06/12/2023    Order Specific Question:   If indicated for the ordered procedure, I authorize the administration of contrast media per Radiology protocol    Answer:   Yes    Order Specific Question:   Does the patient have a contrast media/X-ray dye allergy?    Answer:   No    Order Specific Question:   Preferred imaging location?  Answer:   [facility]    Order Specific Question:   Is Oral Contrast requested for this exam?    Answer:   Yes, Per Radiology protocol   CBC with Differential ([facility] Only)    Standing Status:   Future    Standing Expiration Date:   06/12/2023   CMP ([facility] only)    Standing  Status:   Future    Standing Expiration Date:   06/12/2023     Courtney Sos Vivianna Piccini, PA-C 06/12/22  ADDENDUM: Hematology/Oncology Attending: I had a face-to-face encounter with the patient today.  I reviewed her record, lab and recommended her care plan.  This is a very pleasant 72 years old African-American female with recurrent non-small cell lung cancer that was initially diagnosed as stage IIIa status post a course of concurrent chemoradiation followed by consolidation chemotherapy.  The patient had evidence for disease progression and then she started second line treatment with immunotherapy with nivolumab 480 Mg IV every 4 weeks status post 32 cycles.  She took a break off treatment after cycle #32 and then she had evidence for disease progression and resumed her treatment again in March 2022 for a total of 55 cycles.  The patient has been doing fine except for the persistent shortness of breath and she is currently on home oxygen.  She was admitted to the hospital recently with COPD exacerbation and suspicious pneumonia. I had a lengthy discussion with the patient today about her condition.  I recommended for her to have repeat CT scan of the chest, abdomen and pelvis for restaging of her disease and if there is no evidence for disease progression, will consider giving the patient another break of immunotherapy with close monitoring. The patient is in agreement with the current plan. She will come back for follow-up visit in around 2 weeks. For the iron deficiency anemia, will give her iron infusion with Venofer for 3 weeks. The patient was advised to call immediately if she has any other concerning symptoms in the interval. The total time spent in the appointment was 30 minutes. Disclaimer: This note was dictated with voice recognition software. Similar sounding words can inadvertently be transcribed and may be missed upon review. Eilleen Kempf, MD

## 2022-06-12 ENCOUNTER — Inpatient Hospital Stay (HOSPITAL_BASED_OUTPATIENT_CLINIC_OR_DEPARTMENT_OTHER): Payer: 59 | Admitting: Physician Assistant

## 2022-06-12 ENCOUNTER — Other Ambulatory Visit: Payer: Self-pay | Admitting: Physician Assistant

## 2022-06-12 ENCOUNTER — Telehealth: Payer: Self-pay

## 2022-06-12 ENCOUNTER — Inpatient Hospital Stay: Payer: 59 | Attending: Physician Assistant

## 2022-06-12 ENCOUNTER — Inpatient Hospital Stay: Payer: 59

## 2022-06-12 ENCOUNTER — Inpatient Hospital Stay: Payer: 59 | Admitting: Nutrition

## 2022-06-12 VITALS — BP 89/59 | HR 84 | Temp 97.8°F | Resp 18 | Wt 114.9 lb

## 2022-06-12 DIAGNOSIS — I959 Hypotension, unspecified: Secondary | ICD-10-CM | POA: Diagnosis not present

## 2022-06-12 DIAGNOSIS — D649 Anemia, unspecified: Secondary | ICD-10-CM

## 2022-06-12 DIAGNOSIS — C3490 Malignant neoplasm of unspecified part of unspecified bronchus or lung: Secondary | ICD-10-CM

## 2022-06-12 DIAGNOSIS — C3491 Malignant neoplasm of unspecified part of right bronchus or lung: Secondary | ICD-10-CM

## 2022-06-12 DIAGNOSIS — Z79620 Long term (current) use of immunosuppressive biologic: Secondary | ICD-10-CM | POA: Insufficient documentation

## 2022-06-12 DIAGNOSIS — D509 Iron deficiency anemia, unspecified: Secondary | ICD-10-CM | POA: Diagnosis not present

## 2022-06-12 DIAGNOSIS — C342 Malignant neoplasm of middle lobe, bronchus or lung: Secondary | ICD-10-CM | POA: Insufficient documentation

## 2022-06-12 DIAGNOSIS — Z95828 Presence of other vascular implants and grafts: Secondary | ICD-10-CM

## 2022-06-12 LAB — CBC WITH DIFFERENTIAL (CANCER CENTER ONLY)
Abs Immature Granulocytes: 0.03 10*3/uL (ref 0.00–0.07)
Basophils Absolute: 0 10*3/uL (ref 0.0–0.1)
Basophils Relative: 1 %
Eosinophils Absolute: 0.2 10*3/uL (ref 0.0–0.5)
Eosinophils Relative: 4 %
HCT: 28 % — ABNORMAL LOW (ref 36.0–46.0)
Hemoglobin: 9.4 g/dL — ABNORMAL LOW (ref 12.0–15.0)
Immature Granulocytes: 1 %
Lymphocytes Relative: 12 %
Lymphs Abs: 0.8 10*3/uL (ref 0.7–4.0)
MCH: 25.4 pg — ABNORMAL LOW (ref 26.0–34.0)
MCHC: 33.6 g/dL (ref 30.0–36.0)
MCV: 75.7 fL — ABNORMAL LOW (ref 80.0–100.0)
Monocytes Absolute: 0.4 10*3/uL (ref 0.1–1.0)
Monocytes Relative: 5 %
Neutro Abs: 5.1 10*3/uL (ref 1.7–7.7)
Neutrophils Relative %: 77 %
Platelet Count: 350 10*3/uL (ref 150–400)
RBC: 3.7 MIL/uL — ABNORMAL LOW (ref 3.87–5.11)
RDW: 18.6 % — ABNORMAL HIGH (ref 11.5–15.5)
WBC Count: 6.5 10*3/uL (ref 4.0–10.5)
nRBC: 0 % (ref 0.0–0.2)

## 2022-06-12 LAB — CMP (CANCER CENTER ONLY)
ALT: 5 U/L (ref 0–44)
AST: 12 U/L — ABNORMAL LOW (ref 15–41)
Albumin: 3.7 g/dL (ref 3.5–5.0)
Alkaline Phosphatase: 240 U/L — ABNORMAL HIGH (ref 38–126)
Anion gap: 6 (ref 5–15)
BUN: 13 mg/dL (ref 8–23)
CO2: 26 mmol/L (ref 22–32)
Calcium: 8.5 mg/dL — ABNORMAL LOW (ref 8.9–10.3)
Chloride: 109 mmol/L (ref 98–111)
Creatinine: 0.45 mg/dL (ref 0.44–1.00)
GFR, Estimated: >60 mL/min (ref >60)
Glucose, Bld: 83 mg/dL (ref 70–99)
Potassium: 3.9 mmol/L (ref 3.5–5.1)
Sodium: 141 mmol/L (ref 135–145)
Total Bilirubin: 0.2 mg/dL — ABNORMAL LOW (ref 0.3–1.2)
Total Protein: 6.2 g/dL — ABNORMAL LOW (ref 6.5–8.1)

## 2022-06-12 LAB — TSH: TSH: 1.941 u[IU]/mL (ref 0.350–4.500)

## 2022-06-12 LAB — SAMPLE TO BLOOD BANK

## 2022-06-12 LAB — IRON AND IRON BINDING CAPACITY (CC-WL,HP ONLY)
Iron: 18 ug/dL — ABNORMAL LOW (ref 28–170)
Saturation Ratios: 5 % — ABNORMAL LOW (ref 10.4–31.8)
TIBC: 400 ug/dL (ref 250–450)
UIBC: 382 ug/dL (ref 148–442)

## 2022-06-12 LAB — FERRITIN: Ferritin: 6 ng/mL — ABNORMAL LOW (ref 11–307)

## 2022-06-12 MED ORDER — SODIUM CHLORIDE 0.9% FLUSH
10.0000 mL | Freq: Once | INTRAVENOUS | Status: AC
  Administered 2022-06-12: 10 mL

## 2022-06-12 MED ORDER — INTEGRA PLUS PO CAPS: 1.0000 | ORAL_CAPSULE | Freq: Every morning | ORAL | 2 refills | Status: DC

## 2022-06-12 MED ORDER — SODIUM CHLORIDE 0.9% FLUSH
10.0000 mL | Freq: Once | INTRAVENOUS | Status: AC
  Administered 2022-06-12: 10 mL via INTRAVENOUS

## 2022-06-12 MED ORDER — SODIUM CHLORIDE 0.9 % IV SOLN: INTRAVENOUS | Status: DC

## 2022-06-12 MED ORDER — HEPARIN SOD (PORK) LOCK FLUSH 100 UNIT/ML IV SOLN
500.0000 [IU] | Freq: Once | INTRAVENOUS | Status: AC
  Administered 2022-06-12: 500 [IU] via INTRAVENOUS

## 2022-06-12 NOTE — Telephone Encounter (Signed)
Attempted to notify Patient of denial for prior authorization request for Integra. Patient has restrictions on her telephone that would not allow voicemail or calls to go through.

## 2022-06-12 NOTE — Patient Instructions (Signed)

## 2022-06-12 NOTE — Progress Notes (Signed)
Nutrition follow-up completed with patient during infusion for recurrent non-small cell lung cancer.  She is receiving immunotherapy with nivolumab.  She is a patient of Dr. Julien Nordmann.  Weight documented as 114 pounds 4 ounces on February 29.  This is decreased from 123 pounds in September, 2023.  Labs were reviewed.  Patient denies nausea, vomiting, constipation, and diarrhea.  She states she does not know why she is losing weight because she is eating well.  She consumes an oral nutrition supplement 3 times a day.  Nutrition diagnosis: Severe malnutrition continued.  Intervention: Encourage patient to continue strategies for eating small frequent meals and snacks utilizing high-calorie, high-protein foods. Continue drinking Ensure Plus or equivalent 3 times daily.  Provided 1 complementary case of Ensure Plus HP. Provided support and encouragement.  Monitoring, evaluation, goals: Patient will continue high-calorie, high-protein foods with oral nutrition supplements 3 times a day.  Next visit: We are happy to continue to see patient as needed.  No future chemotherapy appointments have been scheduled as of today.  Will monitor.

## 2022-06-12 NOTE — Progress Notes (Signed)
Patient seen by PA today  Vitals are not all within treatment parameters. Low BP  Labs reviewed: and are within treatment parameters.  Per physician team, patient will not be receiving treatment today.  Patient to receive 1 Liter of fluids over 2 hours.  Infusion team made aware.

## 2022-06-13 ENCOUNTER — Telehealth: Payer: Self-pay | Admitting: Internal Medicine

## 2022-06-13 NOTE — Telephone Encounter (Signed)
Called patient to clarify 3/14 appointment changes. Unable to leave vm. Will f/u 3/4.

## 2022-06-18 ENCOUNTER — Other Ambulatory Visit: Payer: Medicare Other

## 2022-06-18 ENCOUNTER — Encounter: Payer: 59 | Admitting: Dietician

## 2022-06-18 ENCOUNTER — Ambulatory Visit: Payer: Medicare Other

## 2022-06-18 ENCOUNTER — Inpatient Hospital Stay: Payer: 59 | Attending: Physician Assistant

## 2022-06-18 ENCOUNTER — Ambulatory Visit: Payer: Medicare Other | Admitting: Physician Assistant

## 2022-06-18 DIAGNOSIS — I959 Hypotension, unspecified: Secondary | ICD-10-CM | POA: Insufficient documentation

## 2022-06-18 DIAGNOSIS — Z79899 Other long term (current) drug therapy: Secondary | ICD-10-CM | POA: Insufficient documentation

## 2022-06-18 DIAGNOSIS — C342 Malignant neoplasm of middle lobe, bronchus or lung: Secondary | ICD-10-CM | POA: Insufficient documentation

## 2022-06-18 DIAGNOSIS — D509 Iron deficiency anemia, unspecified: Secondary | ICD-10-CM | POA: Insufficient documentation

## 2022-06-23 NOTE — Progress Notes (Deleted)
[facility] OFFICE PROGRESS NOTE  [name], [place] [address]  DIAGNOSIS: Recurrent non-small cell lung cancer initially diagnosed as stage IIIA (T1b, N2, M0) non-small cell lung cancer presented with right middle lobe pulmonary nodule, mediastinal lymphadenopathy and highly suspicious for small nodule in the left upper lobe that could change her stage to stage IV that could present another synchronous primary lesion in the left upper lobe. This was diagnosed in September 2017.     PRIOR THERAPY: 1) Concurrent chemoradiation with weekly carboplatin for AUC of 2 and paclitaxel 45 MG/M2 status post 6 cycles last dose was given 02/25/2016 with partial response. 2) Consolidation chemotherapy with carboplatin for AUC of 5 and paclitaxel 175 MG/M2 every 3 weeks with Neulasta support. First dose 04/22/2016. Status post 3 cycles. 3) Second line treatment with immunotherapy with Nivolumab 480 mg IV every 4 weeks status post 32 cycles.  Discontinued secondary to intolerance and frequent hospitalization with pneumonia and pneumonitis.  She had a break of treatment between November 24, 2019 until June 28, 2020.  CURRENT THERAPY: Resuming her treatment with immunotherapy with nivolumab 480 mg IV every 4 weeks.  Cycle #33 started on June 28, 2020.  Status post 55 cycles.   ***  INTERVAL HISTORY: Courtney Zuniga 72 y.o. female returns to clinic today for follow-up visit.  The patient was last seen on 06/12/2022.  The patient had not had any treatment with immunotherapy since 03/27/2023 due to several hospitalizations in the interim.  She had a hospitalization in January 2024 for COPD exacerbation secondary to influenza.  She was then hospitalized in February 2024 for a GI bleed.  We then saw the patient on 06/12/2022.  At that point in time, recommended having a restaging CT scan performed.  If her scan is stable, then we would consider giving the patient a break from  treatment.  Also since last being seen, the patient was found to be iron deficient and IV iron infusions with Venofer were offered.  The patient did not show up to 2 of her infusion appointments.  We also prescribed her an iron supplement but this was not covered by her insurance.  Otherwise since last being seen, the patient is feeling ***.  Insomnia last time.  Hypertension?  She denies any fever, chills, or night sweats.  She still struggles with poor appetite and she is currently taking Megace.  Nutrition?  She continues to have chronic respiratory failure.  She saw her pulmonologist a few days ago.  She is on 4 L of supplemental oxygen at baseline.  She denies any nausea, vomiting, diarrhea, or constipation.  Blood in the stool or black stool?  She denies any rashes or skin changes.  Abdominal pain?  Hematemesis.  She is here for evaluation to review her scan results.  MEDICAL HISTORY: Past Medical History:  Diagnosis Date   Anemia    as a young woman   Arthritis    osteoartritis   Asthma    Brain tumor (benign) ([facility]) 2005 [facility]   Benign   Chronic headaches    Chronic hip pain    Chronic pain    COPD (chronic obstructive pulmonary disease) (H[facility])    Coronary artery disease    Depression    Depression 05/15/2016   Encounter for antineoplastic chemotherapy 01/10/2016   GERD (gastroesophageal reflux disease)    Hypertension    Lung cancer ([facility]) dx'd 01/2016   currently on chemo and radiation  NSTEMI (non-ST elevated myocardial infarction) ([facility]) yrs ago   On home O2    qhs 2 liters at hs and prn   Pneumonia last time 2 yrs ago   Shortness of breath dyspnea    with activity    ALLERGIES:  is allergic to desyrel [trazodone].  MEDICATIONS:  Current Outpatient Medications  Medication Sig Dispense Refill   acetaminophen (TYLENOL) 325 MG tablet Take 2 tablets (650 mg total) by mouth every 6 (six) hours as needed for mild pain, fever or headache. 12 tablet 2   albuterol (VENTOLIN  HFA) 108 (90 Base) MCG/ACT inhaler Inhale 2 puffs into the lungs every 4 (four) hours as needed for shortness of breath. 18 g 3   amoxicillin-clavulanate (AUGMENTIN) 875-125 MG tablet Take 1 tablet by mouth every 12 (twelve) hours. 8 tablet 0   DULoxetine (CYMBALTA) 60 MG capsule Take 1 capsule (60 mg total) by mouth 2 (two) times daily. 180 capsule 3   ENSURE (ENSURE) Take 237 mLs by mouth 3 (three) times daily between meals.     FeFum-FePoly-FA-B Cmp-C-Biot (INTEGRA PLUS) CAPS Take 1 capsule by mouth every morning. 30 capsule 2   fluticasone furoate-vilanterol (BREO ELLIPTA) 200-25 MCG/ACT AEPB Inhale 1 puff into the lungs daily. 60 each 4   Ipratropium-Albuterol (COMBIVENT RESPIMAT) 20-100 MCG/ACT AERS respimat Inhale 1 puff into the lungs every 6 (six) hours as needed for wheezing or shortness of breath. 4 g 0   ipratropium-albuterol (DUONEB) 0.5-2.5 (3) MG/3ML SOLN Take 3 mLs by nebulization every 6 (six) hours as needed. 360 mL 2   megestrol (MEGACE ES) 625 MG/5ML suspension Take 5 mLs (625 mg total) by mouth daily. 150 mL 0   metoprolol tartrate (LOPRESSOR) 25 MG tablet Take 1 tablet (25 mg total) by mouth 2 (two) times daily. 60 tablet 3   oxyCODONE-acetaminophen (PERCOCET/ROXICET) 5-325 MG tablet Take 1 tablet by mouth every 8 (eight) hours as needed for moderate pain. 12 tablet 0   pantoprazole (PROTONIX) 40 MG tablet Take 1 tablet (40 mg total) by mouth 2 (two) times daily. 60 tablet 1   predniSONE (DELTASONE) 50 MG tablet Take 1 tablet (50 mg total) by mouth daily with breakfast. 3 tablet 0   prochlorperazine (COMPAZINE) 10 MG tablet Take 1 tablet (10 mg total) by mouth every 6 (six) hours as needed for nausea or vomiting. 30 tablet 0   tamsulosin (FLOMAX) 0.4 MG CAPS capsule Take 1 capsule (0.4 mg total) by mouth daily after supper. 30 capsule 3   topiramate (TOPAMAX) 25 MG tablet Take 1 tablet (25 mg total) by mouth 2 (two) times daily. 45 tablet 2   TRELEGY ELLIPTA 100-62.5-25 MCG/ACT  AEPB Inhale 1 puff into the lungs daily. 60 each 3   Vitamin D, Ergocalciferol, (DRISDOL) 1.25 MG (50000 UNIT) CAPS capsule Take 1 capsule (50,000 Units total) by mouth once a week. 5 capsule 3   No current facility-administered medications for this visit.    SURGICAL HISTORY:  Past Surgical History:  Procedure Laterality Date   CHOLECYSTECTOMY     COLONOSCOPY  2015   Results requested from [facility]   COLONOSCOPY     ESOPHAGOGASTRODUODENOSCOPY N/A 08/14/2015   Procedure: ESOPHAGOGASTRODUODENOSCOPY (EGD);  Surgeon: Daneil Dolin, MD;  Location: [facility];  Service: Endoscopy;  Laterality: N/A;  215    ESOPHAGOGASTRODUODENOSCOPY (EGD) WITH PROPOFOL N/A 09/13/2015   Procedure: ESOPHAGOGASTRODUODENOSCOPY (EGD) WITH PROPOFOL;  Surgeon: Milus Banister, MD;  Location: [facility];  Service: Endoscopy;  Laterality:  N/A;   ESOPHAGOGASTRODUODENOSCOPY (EGD) WITH PROPOFOL N/A 05/19/2022   Procedure: ESOPHAGOGASTRODUODENOSCOPY (EGD) WITH PROPOFOL;  Surgeon: Eloise Harman, DO;  [facility]: [facility];  Service: Endoscopy;  Laterality: N/A;   EUS N/A 03/12/2017   Procedure: UPPER ENDOSCOPIC ULTRASOUND (EUS) RADIAL;  Surgeon: Milus Banister, MD;  Location: [facility];  Service: Endoscopy;  Laterality: N/A;   HOT HEMOSTASIS  05/19/2022   Procedure: HOT HEMOSTASIS (ARGON PLASMA COAGULATION/BICAP);  Surgeon: Eloise Harman, DO;  [facility]: [facility];  Service: Endoscopy;;   IR IMAGING GUIDED PORT INSERTION  01/20/2022   TUMOR REMOVAL  2005   Benign   UPPER ESOPHAGEAL ENDOSCOPIC ULTRASOUND (EUS)  09/13/2015   Procedure: UPPER ESOPHAGEAL ENDOSCOPIC ULTRASOUND (EUS);  Surgeon: Milus Banister, MD;  Location: [facility];  Service: Endoscopy;;   VIDEO BRONCHOSCOPY WITH ENDOBRONCHIAL NAVIGATION N/A 12/31/2015   Procedure: VIDEO BRONCHOSCOPY WITH ENDOBRONCHIAL NAVIGATION;  Surgeon: Melrose Nakayama, MD;  Location: [facility];  Service: Thoracic;  Laterality: N/A;   VIDEO BRONCHOSCOPY  WITH ENDOBRONCHIAL ULTRASOUND N/A 11/08/2015   Procedure: VIDEO BRONCHOSCOPY WITH ENDOBRONCHIAL ULTRASOUND;  Surgeon: Ivin Poot, MD;  Location: [facility];  Service: Thoracic;  Laterality: N/A;    REVIEW OF SYSTEMS:   Review of Systems  Constitutional: Negative for appetite change, chills, fatigue, fever and unexpected weight change.  HENT:   Negative for mouth sores, nosebleeds, sore throat and trouble swallowing.   Eyes: Negative for eye problems and icterus.  Respiratory: Negative for cough, hemoptysis, shortness of breath and wheezing.   Cardiovascular: Negative for chest pain and leg swelling.  Gastrointestinal: Negative for abdominal pain, constipation, diarrhea, nausea and vomiting.  Genitourinary: Negative for bladder incontinence, difficulty urinating, dysuria, frequency and hematuria.   Musculoskeletal: Negative for back pain, gait problem, neck pain and neck stiffness.  Skin: Negative for itching and rash.  Neurological: Negative for dizziness, extremity weakness, gait problem, headaches, light-headedness and seizures.  Hematological: Negative for adenopathy. Does not bruise/bleed easily.  Psychiatric/Behavioral: Negative for confusion, depression and sleep disturbance. The patient is not nervous/anxious.     PHYSICAL EXAMINATION:  There were no vitals taken for this visit.  ECOG PERFORMANCE STATUS: {CHL ONC ECOG [id number]  Physical Exam  Constitutional: Oriented to person, place, and time and well-developed, well-nourished, and in no distress. No distress.  HENT:  Head: Normocephalic and atraumatic.  Mouth/Throat: Oropharynx is clear and moist. No oropharyngeal exudate.  Eyes: Conjunctivae are normal. Right eye exhibits no discharge. Left eye exhibits no discharge. No scleral icterus.  Neck: Normal range of motion. Neck supple.  Cardiovascular: Normal rate, regular rhythm, normal heart sounds and intact distal pulses.   Pulmonary/Chest: Effort normal and breath  sounds normal. No respiratory distress. No wheezes. No rales.  Abdominal: Soft. Bowel sounds are normal. Exhibits no distension and no mass. There is no tenderness.  Musculoskeletal: Normal range of motion. Exhibits no edema.  Lymphadenopathy:    No cervical adenopathy.  Neurological: Alert and oriented to person, place, and time. Exhibits normal muscle tone. Gait normal. Coordination normal.  Skin: Skin is warm and dry. No rash noted. Not diaphoretic. No erythema. No pallor.  Psychiatric: Mood, memory and judgment normal.  Vitals reviewed.  LABORATORY DATA: Lab Results  Component Value Date   WBC 6.5 06/12/2022   HGB 9.4 (L) 06/12/2022   HCT 28.0 (L) 06/12/2022   MCV 75.7 (L) 06/12/2022   PLT 350 06/12/2022      Chemistry      Component Value Date/Time  NA 141 06/12/2022 0928   NA 144 04/09/2017 0948   K 3.9 06/12/2022 0928   K 4.3 04/09/2017 0948   CL 109 06/12/2022 0928   CO2 26 06/12/2022 0928   CO2 25 04/09/2017 0948   BUN 13 06/12/2022 0928   BUN 16.8 04/09/2017 0948   CREATININE 0.45 06/12/2022 0928   CREATININE 0.8 04/09/2017 0948      Component Value Date/Time   CALCIUM 8.5 (L) 06/12/2022 0928   CALCIUM 9.1 04/09/2017 0948   ALKPHOS 240 (H) 06/12/2022 0928   ALKPHOS 216 (H) 04/09/2017 0948   AST 12 (L) 06/12/2022 0928   AST 12 04/09/2017 0948   ALT 5 06/12/2022 0928   ALT 13 04/09/2017 0948   BILITOT 0.2 (L) 06/12/2022 0928   BILITOT 0.22 04/09/2017 0948       RADIOGRAPHIC STUDIES:  No results found.   ASSESSMENT/PLAN:  This is a very pleasant 72 year old African-American female with recurrent metastatic non-small cell lung cancer, adenocarcinoma of the right middle lobe.  She was initially diagnosed as stage IIIa in September 2017.     She completed concurrent chemoradiation followed by consolidation with carboplatin and paclitaxel. She is status post 3 cycles. The patient was on observation but a restaging CT scan showed evidence of disease  progression.   She is currently undergoing treatment with second line with immunotherapy with nivolumab 480 mg IV every 4 weeks.  She was status post 31 cycles. Her treatment had been on hold since August 2021 until her COPD can get under control. She showed evidence of some recurrent disease in March 2022 and she resumed her treatment with cycle number cycle #33 of single agent nivolumab 480 mg IV every 4 weeks in March 2022 status post 55 cycles.  The patient was recently hospitalized for COPD exacerbation secondary to influenza.    The patient recently had a restaging CT scan performed.  The patient was seen Dr. Julien Nordmann today.  Dr. Julien Nordmann personally and independently reviewed the scan discussed the results with the patient today.  Scan showed ***Dr. Julien Nordmann recommends ***  I will arrange for restaging CT scan of the chest in ***  Follow-up?  Port-A-Cath  She will proceed with her first iron infusion today.  Reschedule 2 more weeks iron infusion?  Over-the-counter ferrous sulfate?  Check blood pressure  She will follow-up with her PCP regarding her insomnia.  I discussed symptoms of worsening anemia and she was advised to seek medical evaluation and repeat blood work should she develop any abnormal signs of bleeding or worsening fatigue, shortness of breath, lightheadedness, etc.     She will continue taking megace  The patient was advised to call immediately if she has any concerning symptoms in the interval. The patient voices understanding of current disease status and treatment options and is in agreement with the current care plan. All questions were answered. The patient knows to call the clinic with any problems, questions or concerns. We can certainly see the patient much sooner if necessary         No orders of the defined types were placed in this encounter.    I spent {CHL ONC TIME VISIT - [id number] counseling the patient face to face. The total time  spent in the appointment was {CHL ONC TIME VISIT - [id number].  Shayra Anton L Hoda Hon, PA-C 06/23/22

## 2022-06-25 ENCOUNTER — Ambulatory Visit (HOSPITAL_COMMUNITY)
Admission: RE | Admit: 2022-06-25 | Discharge: 2022-06-25 | Disposition: A | Discharge status: Home / Self Care | Payer: 59 | Source: Ambulatory Visit | Attending: Physician Assistant | Admitting: Physician Assistant

## 2022-06-25 ENCOUNTER — Other Ambulatory Visit: Payer: Self-pay | Admitting: Physician Assistant

## 2022-06-25 ENCOUNTER — Ambulatory Visit: Payer: 59

## 2022-06-25 DIAGNOSIS — D509 Iron deficiency anemia, unspecified: Secondary | ICD-10-CM

## 2022-06-25 DIAGNOSIS — I959 Hypotension, unspecified: Secondary | ICD-10-CM

## 2022-06-25 DIAGNOSIS — C3490 Malignant neoplasm of unspecified part of unspecified bronchus or lung: Secondary | ICD-10-CM | POA: Insufficient documentation

## 2022-06-25 DIAGNOSIS — C3491 Malignant neoplasm of unspecified part of right bronchus or lung: Secondary | ICD-10-CM

## 2022-06-25 MED ORDER — IOHEXOL 300 MG/ML  SOLN
100.0000 mL | Freq: Once | INTRAMUSCULAR | Status: AC | PRN
  Administered 2022-06-25: 100 mL via INTRAVENOUS

## 2022-06-26 ENCOUNTER — Inpatient Hospital Stay: Payer: 59 | Admitting: Physician Assistant

## 2022-06-26 ENCOUNTER — Inpatient Hospital Stay: Payer: 59

## 2022-06-26 ENCOUNTER — Telehealth: Payer: Self-pay | Admitting: *Deleted

## 2022-06-26 NOTE — Telephone Encounter (Signed)
PC to patient regarding missed appointments this a.m., a female answered the phone & stated the patient didn't feel well last night & is still asleep this morning.  Informed him scheduling will contact the patient to reschedule, he verbalizes understanding.  Scheduling message sent.

## 2022-06-30 NOTE — Progress Notes (Unsigned)
[facility] OFFICE PROGRESS NOTE  [name], [place] [address]  DIAGNOSIS: Recurrent non-small cell lung cancer initially diagnosed as stage IIIA (T1b, N2, M0) non-small cell lung cancer presented with right middle lobe pulmonary nodule, mediastinal lymphadenopathy and highly suspicious for small nodule in the left upper lobe that could change her stage to stage IV that could present another synchronous primary lesion in the left upper lobe. This was diagnosed in September 2017.    PRIOR THERAPY: 1) Concurrent chemoradiation with weekly carboplatin for AUC of 2 and paclitaxel 45 MG/M2 status post 6 cycles last dose was given 02/25/2016 with partial response. 2) Consolidation chemotherapy with carboplatin for AUC of 5 and paclitaxel 175 MG/M2 every 3 weeks with Neulasta support. First dose 04/22/2016. Status post 3 cycles. 3) Second line treatment with immunotherapy with Nivolumab 480 mg IV every 4 weeks status post 32 cycles.  Discontinued secondary to intolerance and frequent hospitalization with pneumonia and pneumonitis.  She had a break of treatment between November 24, 2019 until June 28, 2020.  CURRENT THERAPY:  Resuming her treatment with immunotherapy with nivolumab 480 mg IV every 4 weeks.  Cycle #33 started on June 28, 2020.  Status post 55 cycles.  Her treatment is currently on hold starting from 05/27/21  INTERVAL HISTORY: Courtney Zuniga 72 y.o. female returns to the clinic today for a follow-up visit.  The patient was last seen on 06/12/2022 by Dr. Julien Nordmann and myself.  At that time, the patient had not been seen since December 2023. The patient was hospitalized in January 2024 due to COPD exacerbation secondary to influenza.  She was then hospitalized again in February 2024 for generalized weakness, fatigue, and exertional dyspnea exertion and she was found to have a GI bleed which was secondary to frequent BC powder use causing gastric ulcers and  multiple AVMs which was treated with APC.  She was supposed to follow-up with gastroenterology in [place] but she is not sure the name of the provider or the practice.  She does not have any upcoming appointments to her knowledge.    She also was found to have iron deficiency anemia at her last appointment and she is scheduled to receive IV iron with Venofer starting tomorrow.  Did not pick up her iron supplement that was sent to her pharmacy at her last appointment due to it not being covered by insurance.  She was not aware that you can take oral iron supplement over-the-counter.  Is also been struggling with hypotension for which she is trying to drink a lot of water at home.  When she was last seen in 06/11/2022, the patient was still recovering.  She was endorsing insomnia.  She denied any fever, chills, or night sweats.  She reports baseline cold intolerance due to her anemia.  She is on Megace for her decreased appetite.  Her weight is stable since last being seen but she has lost approximately 15 pounds in December 2023.  Denies any nausea, vomiting, or diarrhea.  She is taking MiraLAX and stool softener once a day for constipation.  Or constipation. She continues to have significant COPD for which she is on supplemental oxygen at baseline with 4 L due to her chronic respiratory failure.  Denies any chest pain, hemoptysis, or significant cough.  She does feel that her dyspnea on exertion may have gotten worse recently because she noticed that she is more winded  breathing is still same. Shortness of breath  think got worse because panting if walking to bathroom. Still has headacehes. See  Denies any headache or visual changes.  Denies any rashes or skin changes.  She was also found to have iron deficiency anemia at her last appointment we have arrange for IV iron infusions with Venofer.  Dr. Julien Nordmann discussed at her last appointment that if her restaging CT scan looks stable, we would give her a  treatment break.  She recently had a restaging CT scan.  She is here today for evaluation to review her scan results.   MEDICAL HISTORY: Past Medical History:  Diagnosis Date   Anemia    as a young woman   Arthritis    osteoartritis   Asthma    Brain tumor (benign) ([facility]) 2005 [facility]   Benign   Chronic headaches    Chronic hip pain    Chronic pain    COPD (chronic obstructive pulmonary disease) ([facility])    Coronary artery disease    Depression    Depression 05/15/2016   Encounter for antineoplastic chemotherapy 01/10/2016   GERD (gastroesophageal reflux disease)    Hypertension    Lung cancer ([facility]) dx'd 01/2016   currently on chemo and radiation    NSTEMI (non-ST elevated myocardial infarction) ([facility]) yrs ago   On home O2    qhs 2 liters at hs and prn   Pneumonia last time 2 yrs ago   Shortness of breath dyspnea    with activity    ALLERGIES:  is allergic to desyrel [trazodone].  MEDICATIONS:  Current Outpatient Medications  Medication Sig Dispense Refill   acetaminophen (TYLENOL) 325 MG tablet Take 2 tablets (650 mg total) by mouth every 6 (six) hours as needed for mild pain, fever or headache. 12 tablet 2   albuterol (VENTOLIN HFA) 108 (90 Base) MCG/ACT inhaler Inhale 2 puffs into the lungs every 4 (four) hours as needed for shortness of breath. 18 g 3   doxycycline (VIBRA-TABS) 100 MG tablet Take 1 tablet (100 mg total) by mouth 2 (two) times daily. 20 tablet 0   DULoxetine (CYMBALTA) 60 MG capsule Take 1 capsule (60 mg total) by mouth 2 (two) times daily. 180 capsule 3   ENSURE (ENSURE) Take 237 mLs by mouth 3 (three) times daily between meals.     FeFum-FePoly-FA-B Cmp-C-Biot (INTEGRA PLUS) CAPS Take 1 capsule by mouth every morning. 30 capsule 2   fluticasone furoate-vilanterol (BREO ELLIPTA) 200-25 MCG/ACT AEPB Inhale 1 puff into the lungs daily. 60 each 4   Ipratropium-Albuterol (COMBIVENT RESPIMAT) 20-100 MCG/ACT AERS respimat Inhale 1 puff into the lungs every 6 (six)  hours as needed for wheezing or shortness of breath. 4 g 0   ipratropium-albuterol (DUONEB) 0.5-2.5 (3) MG/3ML SOLN Take 3 mLs by nebulization every 6 (six) hours as needed. 360 mL 2   megestrol (MEGACE ES) 625 MG/5ML suspension Take 5 mLs (625 mg total) by mouth daily. 150 mL 0   oxyCODONE-acetaminophen (PERCOCET/ROXICET) 5-325 MG tablet Take 1 tablet by mouth every 8 (eight) hours as needed for moderate pain. 12 tablet 0   pantoprazole (PROTONIX) 40 MG tablet Take 1 tablet (40 mg total) by mouth 2 (two) times daily. 60 tablet 1   predniSONE (DELTASONE) 50 MG tablet Take 1 tablet (50 mg total) by mouth daily with breakfast. 3 tablet 0   prochlorperazine (COMPAZINE) 10 MG tablet Take 1 tablet (10 mg total) by mouth every 6 (six) hours as needed for nausea or vomiting. 30 tablet 0   tamsulosin (  FLOMAX) 0.4 MG CAPS capsule Take 1 capsule (0.4 mg total) by mouth daily after supper. 30 capsule 3   topiramate (TOPAMAX) 25 MG tablet Take 1 tablet (25 mg total) by mouth 2 (two) times daily. 45 tablet 2   TRELEGY ELLIPTA 100-62.5-25 MCG/ACT AEPB Inhale 1 puff into the lungs daily. 60 each 3   Vitamin D, Ergocalciferol, (DRISDOL) 1.25 MG (50000 UNIT) CAPS capsule Take 1 capsule (50,000 Units total) by mouth once a week. 5 capsule 3   metoprolol tartrate (LOPRESSOR) 25 MG tablet Take 1 tablet (25 mg total) by mouth 2 (two) times daily. 60 tablet 3   No current facility-administered medications for this visit.    SURGICAL HISTORY:  Past Surgical History:  Procedure Laterality Date   CHOLECYSTECTOMY     COLONOSCOPY  2015   Results requested from [facility]   COLONOSCOPY     ESOPHAGOGASTRODUODENOSCOPY N/A 08/14/2015   Procedure: ESOPHAGOGASTRODUODENOSCOPY (EGD);  Surgeon: Daneil Dolin, MD;  [facility];  Service: Endoscopy;  Laterality: N/A;  215    ESOPHAGOGASTRODUODENOSCOPY (EGD) WITH PROPOFOL N/A 09/13/2015   Procedure: ESOPHAGOGASTRODUODENOSCOPY (EGD) WITH PROPOFOL;  Surgeon:  Milus Banister, MD;  Location: [facility];  Service: Endoscopy;  Laterality: N/A;   ESOPHAGOGASTRODUODENOSCOPY (EGD) WITH PROPOFOL N/A 05/19/2022   Procedure: ESOPHAGOGASTRODUODENOSCOPY (EGD) WITH PROPOFOL;  Surgeon: Eloise Harman, DO;  [facility];  Service: Endoscopy;  Laterality: N/A;   EUS N/A 03/12/2017   Procedure: UPPER ENDOSCOPIC ULTRASOUND (EUS) RADIAL;  Surgeon: Milus Banister, MD;  Location: [facility];  Service: Endoscopy;  Laterality: N/A;   HOT HEMOSTASIS  05/19/2022   Procedure: HOT HEMOSTASIS (ARGON PLASMA COAGULATION/BICAP);  Surgeon: Eloise Harman, DO;  [facility];  Service: Endoscopy;;   IR IMAGING GUIDED PORT INSERTION  01/20/2022   TUMOR REMOVAL  2005   Benign   UPPER ESOPHAGEAL ENDOSCOPIC ULTRASOUND (EUS)  09/13/2015   Procedure: UPPER ESOPHAGEAL ENDOSCOPIC ULTRASOUND (EUS);  Surgeon: Milus Banister, MD;  Location: [facility];  Service: Endoscopy;;   VIDEO BRONCHOSCOPY WITH ENDOBRONCHIAL NAVIGATION N/A 12/31/2015   Procedure: VIDEO BRONCHOSCOPY WITH ENDOBRONCHIAL NAVIGATION;  Surgeon: Melrose Nakayama, MD;  Location: [facility];  Service: Thoracic;  Laterality: N/A;   VIDEO BRONCHOSCOPY WITH ENDOBRONCHIAL ULTRASOUND N/A 11/08/2015   Procedure: VIDEO BRONCHOSCOPY WITH ENDOBRONCHIAL ULTRASOUND;  Surgeon: Ivin Poot, MD;  Location: [facility];  Service: Thoracic;  Laterality: N/A;    REVIEW OF SYSTEMS:   Constitutional: Positive for baseline fatigue.  Negative for chills, fever and unexpected weight change.  HENT: Negative for mouth sores, nosebleeds, sore throat and trouble swallowing.  Eyes: Negative for eye problems and icterus.  Respiratory: Positive for baseline dyspnea with minimal exertion.  Negative for hemoptysis, shortness of breath and wheezing.   Cardiovascular: Negative for chest pain and leg swelling.  Gastrointestinal: Positive for constipation. Negative for abdominal pain, diarrhea, nausea and vomiting.  Genitourinary:  Negative for bladder incontinence, difficulty urinating, dysuria, frequency and hematuria.   Musculoskeletal: Negative for back pain, gait problem, neck pain and neck stiffness.  Skin: Negative for itching and rash.  Neurological:  Positive for frequent headaches. Negative for dizziness, headache, extremity weakness, gait problem, light-headedness and seizures.  Hematological: Negative for adenopathy. Does not bruise/bleed easily.  Psychiatric/Behavioral: Negative for confusion, depression and sleep disturbance. The patient is not nervous/anxious.     PHYSICAL EXAMINATION:  Blood pressure 98/74, pulse (!) 106, temperature 98.1 F (36.7 C), temperature source Oral, resp. rate 17, weight  115 lb 8 oz (52.4 kg), SpO2 92 %.  ECOG PERFORMANCE STATUS: 2-3  Physical Exam  Constitutional: Oriented to person, place, and time and thin appearing female and in no distress.  HENT:  Head: Normocephalic and atraumatic.  Mouth/Throat: Oropharynx is clear and moist. No oropharyngeal exudate.  Eyes: Conjunctivae are normal. Right eye exhibits no discharge. Left eye exhibits no discharge. No scleral icterus.  Neck: Normal range of motion. Neck supple.  Cardiovascular: Normal rate, regular rhythm, normal heart sounds and intact distal pulses.   Pulmonary/Chest: Effort normal.  Quiet breath sounds in all lung fields.  No respiratory distress. No wheezes. No rales. On supplemental oxygen. Abdominal: Soft. Bowel sounds are normal. Exhibits no distension and no mass. There is no tenderness.  Musculoskeletal: Normal range of motion. Exhibits no edema.  Lymphadenopathy:    No cervical adenopathy.  Neurological: Left facial twitching and weakness (baseline from brain tumor in 2005-stable/chronic). Alert and oriented to person, place, and time.  Exhibits muscle wasting.  The patient was examined in the wheelchair.  Skin: Skin is warm and dry. No rash noted. Not diaphoretic. No erythema. No pallor.  Psychiatric:  Mood, memory and judgment normal.  Vitals reviewed.  LABORATORY DATA: Lab Results  Component Value Date   WBC 6.0 07/01/2022   HGB 9.2 (L) 07/01/2022   HCT 28.2 (L) 07/01/2022   MCV 72.7 (L) 07/01/2022   PLT 423 (H) 07/01/2022      Chemistry      Component Value Date/Time   NA 141 07/01/2022 0827   NA 144 04/09/2017 0948   K 4.2 07/01/2022 0827   K 4.3 04/09/2017 0948   CL 105 07/01/2022 0827   CO2 29 07/01/2022 0827   CO2 25 04/09/2017 0948   BUN 13 07/01/2022 0827   BUN 16.8 04/09/2017 0948   CREATININE 0.58 07/01/2022 0827   CREATININE 0.8 04/09/2017 0948      Component Value Date/Time   CALCIUM 9.2 07/01/2022 0827   CALCIUM 9.1 04/09/2017 0948   ALKPHOS 235 (H) 07/01/2022 0827   ALKPHOS 216 (H) 04/09/2017 0948   AST 15 07/01/2022 0827   AST 12 04/09/2017 0948   ALT 8 07/01/2022 0827   ALT 13 04/09/2017 0948   BILITOT 0.3 07/01/2022 0827   BILITOT 0.22 04/09/2017 0948       RADIOGRAPHIC STUDIES:  CT CHEST ABDOMEN PELVIS W CONTRAST  Result Date: 06/26/2022 CLINICAL DATA:  Restaging recurrent small cell lung cancer. * Tracking Code: BO * EXAM: CT CHEST, ABDOMEN, AND PELVIS WITH CONTRAST TECHNIQUE: Multidetector CT imaging of the chest, abdomen and pelvis was performed following the standard protocol during bolus administration of intravenous contrast. RADIATION DOSE REDUCTION: This exam was performed according to the departmental dose-optimization program which includes automated exposure control, adjustment of the mA and/or kV according to patient size and/or use of iterative reconstruction technique. CONTRAST:  144mL OMNIPAQUE IOHEXOL 300 MG/ML  SOLN COMPARISON:  Multiple prior imaging studies. The most recent is a chest CT from 01/28/2022 FINDINGS: CT CHEST FINDINGS Cardiovascular: The heart is normal in size. No pericardial effusion. The aorta is normal in caliber. Stable atherosclerotic calcifications. No dissection. The branch vessels are patent. Stable  three-vessel coronary artery calcifications. Mild enlargement of the pulmonary arterial trunk which could suggest pulmonary hypertension. The right IJ Port-A-Cath is in good position. Mediastinum/Nodes: No mediastinal or hilar mass or lymphadenopathy. The esophagus is grossly normal. Lungs/Pleura: Stable underlying emphysematous changes and pulmonary scarring. Stable irregular nodular mass in the central right  upper lobe. This measures 16 x 15 mm and is likely treated tumor with radiation fibrosis. No findings to suggest recurrent tumor. There are also stable radiation changes with dense radiation fibrosis involving the right paramediastinal lung. New bilateral lower lobe nodular airspace opacities most likely reflecting pulmonary infiltrates as there are scattered air bronchograms. Findings could be secondary to aspiration pneumonia. No pleural effusions or pleural lesions. Musculoskeletal: No significant bony findings. CT ABDOMEN PELVIS FINDINGS Hepatobiliary: No hepatic lesions to suggest metastatic disease. The gallbladder is surgically absent. Mild associated common bile duct dilatation. No intrahepatic biliary dilatation. The portal and hepatic veins are patent. Pancreas: No mass, inflammation or ductal dilatation. Spleen: Normal size.  No focal lesions. Adrenals/Urinary Tract: The adrenal glands and kidneys are unremarkable. The bladder is unremarkable. Stomach/Bowel: The stomach, duodenum, small bowel and colon are unremarkable. Vascular/Lymphatic: Advanced atherosclerotic calcification involving the aorta and branch vessels but no aortic aneurysm or dissection. The major venous structures are patent. No mesenteric or retroperitoneal mass or adenopathy. Reproductive: Fibroid uterus with areas of dense calcification. No adnexal mass. Other: No pelvic mass or adenopathy. No free pelvic fluid collections. No inguinal mass or adenopathy. No abdominal wall hernia or subcutaneous lesions. Musculoskeletal: No  significant bony findings. Osteoporosis and scattered hemangiomas are noted. No worrisome bone lesions. IMPRESSION: 1. Stable irregular nodular mass in the central right upper lobe consistent with treated tumor with radiation fibrosis. No findings to suggest recurrent tumor. 2. Stable radiation changes with dense radiation fibrosis involving the right paramediastinal lung. 3. New bilateral lower lobe nodular airspace opacities most likely reflecting pulmonary infiltrates as there are scattered air bronchograms. Findings could be secondary to aspiration pneumonia. Recommend short-term follow-up noncontrast chest CT in 3 months to reassess. 4. No mediastinal or hilar mass or adenopathy. 5. No findings for abdominal/pelvic metastatic disease. 6. Status post cholecystectomy with mild associated common bile duct dilatation. Emphysema (ICD10-J43.9). Electronically Signed   By: Marijo Sanes M.D.   On: 06/26/2022 09:32     ASSESSMENT/PLAN:  This is a very pleasant 72 year old African-American female with recurrent metastatic non-small cell lung cancer, adenocarcinoma of the right middle lobe.  She was initially diagnosed as stage IIIa in September 2017.     She completed concurrent chemoradiation followed by consolidation with carboplatin and paclitaxel. She is status post 3 cycles. The patient was on observation but a restaging CT scan showed evidence of disease progression.  She is currently undergoing treatment with second line with immunotherapy with nivolumab 480 mg IV every 4 weeks.  She was status post 31 cycles. Her treatment had been on hold since August 2021 until her COPD can get under control. She showed evidence of some recurrent disease in March 2022 and she resumed her treatment with cycle number cycle #33 of single agent nivolumab 480 mg IV every 4 weeks in March 2022 status post 55 cycles.   She was on a break from treatment from December 2023 due to hospitalizations for COPD exacerbation and  influenza as well as a GI bleed secondary to [facility] powder.  The patient recently had a restaging CT scan performed.  Dr. Julien Nordmann personally and independently reviewed the scan and discussed the results with the patient today.  The scan showed stable irregular mass in the central right upper lobe consistent with treated tumor with radiation fibrosis.  No findings to suggest recurrent tumor.  There is new bilateral lower lobe nodular airspace disease most likely reflecting pulmonary infiltrates which could be secondary to aspiration pneumonia.  No mediastinal or hilar adenopathy.  No findings in the abdomen or pelvis to suggest metastatic disease.    Dr. Julien Nordmann recommends that she continue on observation with a repeat CT scan of the chest in 3 months.  We will give her doxycycline 100 mg twice daily for 10 days for the aspiration pneumonia.  She continues to be hypotensive despite drinking plenty of fluids.  We will arrange for her to receive 1 L fluid while in the infusion room today.  She was initially scheduled for her Venofer starting tomorrow, however we will accommodate her Venofer today.  She will need 3 weekly doses of Venofer.  If the patient's insurance does not cover her Integra plus, she was instructed to pick up his ferrous sulfate tablet.  I gave the patient the number to the GI office in [place] so she make a follow-up appointment due to her recent hospitalization GI bleed.  I will need to make her flush appointment in the interval.  She lives closer to [place].  I will ask that they make her flush appointment in approximately 6 to 8 weeks at the [facility].  We will see her back for follow-up visit in 3 months for evaluation and she review her scan results.  The patient was advised to call immediately if she has any concerning symptoms in the interval. The patient voices understanding of current disease status and treatment options and is in agreement with the  current care plan. All questions were answered. The patient knows to call the clinic with any problems, questions or concerns. We can certainly see the patient much sooner if necessary      Orders Placed This Encounter  Procedures   CT CHEST ABDOMEN PELVIS W CONTRAST    Standing Status:   Future    Standing Expiration Date:   07/01/2023    Order Specific Question:   If indicated for the ordered procedure, I authorize the administration of contrast media per Radiology protocol    Answer:   Yes    Order Specific Question:   Does the patient have a contrast media/X-ray dye allergy?    Answer:   No    Order Specific Question:   Preferred imaging location?    Answer:   [facility]    Order Specific Question:   Is Oral Contrast requested for this exam?    Answer:   Yes, Per Radiology protocol   CBC with Differential ([facility] Only)    Standing Status:   Future    Standing Expiration Date:   07/01/2023   CMP ([facility] only)    Standing Status:   Future    Standing Expiration Date:   07/01/2023   Ferritin    Standing Status:   Future    Standing Expiration Date:   07/01/2023   Iron and Iron Binding Capacity ([facility] only)    Standing Status:   Future    Standing Expiration Date:   07/01/2023     Kiowa Peifer L Letticia Bhattacharyya, PA-C 07/01/22  ADDENDUM: Hematology/Oncology Attending: I had a face-to-face encounter with the patient today.  I reviewed her record, lab, scan and recommended her care plan.  This is a very pleasant 72 years old African-American female with recurrent non-small cell lung cancer that was initially diagnosed as a stage IIIa status post concurrent chemoradiation followed by consolidation chemotherapy.  When the patient had disease progression she was treated with second line treatment with immunotherapy with nivolumab and she has  been on this treatment for several years now.  She has been tolerating the treatment well but she has been complaining of  recurrent pneumonia as well as worsening dyspnea recently.  She was hospitalized in January 2024 for COPD exacerbation secondary to influenza. The patient has her treatment on hold for the last 2 months. Will order CT scan of the chest, abdomen and pelvis recently.  I personally and independently reviewed the scan and discussed the result with the patient today. Her scan showed no concerning findings for disease progression. I recommended for her to continue on observation with close monitoring. We will see her back for follow-up visit in around 3 months with repeat CT scan for restaging of her disease. The patient was advised to call immediately if she has any other concerning symptoms in the interval. The total time spent in the appointment was 30 minutes. Disclaimer: This note was dictated with voice recognition software. Similar sounding words can inadvertently be transcribed and may be missed upon review. Eilleen Kempf, MD

## 2022-07-01 ENCOUNTER — Inpatient Hospital Stay: Payer: 59

## 2022-07-01 ENCOUNTER — Inpatient Hospital Stay (HOSPITAL_BASED_OUTPATIENT_CLINIC_OR_DEPARTMENT_OTHER): Payer: 59 | Admitting: Physician Assistant

## 2022-07-01 VITALS — BP 98/74 | HR 106 | Temp 98.1°F | Resp 17 | Wt 115.5 lb

## 2022-07-01 VITALS — BP 96/55 | HR 104 | Temp 98.4°F | Resp 19

## 2022-07-01 DIAGNOSIS — D500 Iron deficiency anemia secondary to blood loss (chronic): Secondary | ICD-10-CM

## 2022-07-01 DIAGNOSIS — J690 Pneumonitis due to inhalation of food and vomit: Secondary | ICD-10-CM

## 2022-07-01 DIAGNOSIS — I959 Hypotension, unspecified: Secondary | ICD-10-CM

## 2022-07-01 DIAGNOSIS — C3490 Malignant neoplasm of unspecified part of unspecified bronchus or lung: Secondary | ICD-10-CM | POA: Diagnosis not present

## 2022-07-01 DIAGNOSIS — C342 Malignant neoplasm of middle lobe, bronchus or lung: Secondary | ICD-10-CM | POA: Diagnosis not present

## 2022-07-01 DIAGNOSIS — K5520 Angiodysplasia of colon without hemorrhage: Secondary | ICD-10-CM

## 2022-07-01 DIAGNOSIS — D509 Iron deficiency anemia, unspecified: Secondary | ICD-10-CM | POA: Diagnosis present

## 2022-07-01 DIAGNOSIS — Z79899 Other long term (current) drug therapy: Secondary | ICD-10-CM | POA: Diagnosis not present

## 2022-07-01 LAB — CBC WITH DIFFERENTIAL (CANCER CENTER ONLY)
Abs Immature Granulocytes: 0.02 10*3/uL (ref 0.00–0.07)
Basophils Absolute: 0 10*3/uL (ref 0.0–0.1)
Basophils Relative: 1 %
Eosinophils Absolute: 0.4 10*3/uL (ref 0.0–0.5)
Eosinophils Relative: 6 %
HCT: 28.2 % — ABNORMAL LOW (ref 36.0–46.0)
Hemoglobin: 9.2 g/dL — ABNORMAL LOW (ref 12.0–15.0)
Immature Granulocytes: 0 %
Lymphocytes Relative: 17 %
Lymphs Abs: 1 10*3/uL (ref 0.7–4.0)
MCH: 23.7 pg — ABNORMAL LOW (ref 26.0–34.0)
MCHC: 32.6 g/dL (ref 30.0–36.0)
MCV: 72.7 fL — ABNORMAL LOW (ref 80.0–100.0)
Monocytes Absolute: 0.5 10*3/uL (ref 0.1–1.0)
Monocytes Relative: 8 %
Neutro Abs: 4.1 10*3/uL (ref 1.7–7.7)
Neutrophils Relative %: 68 %
Platelet Count: 423 10*3/uL — ABNORMAL HIGH (ref 150–400)
RBC: 3.88 MIL/uL (ref 3.87–5.11)
RDW: 19.3 % — ABNORMAL HIGH (ref 11.5–15.5)
WBC Count: 6 10*3/uL (ref 4.0–10.5)
nRBC: 0 % (ref 0.0–0.2)

## 2022-07-01 LAB — CMP (CANCER CENTER ONLY)
ALT: 8 U/L (ref 0–44)
AST: 15 U/L (ref 15–41)
Albumin: 3.9 g/dL (ref 3.5–5.0)
Alkaline Phosphatase: 235 U/L — ABNORMAL HIGH (ref 38–126)
Anion gap: 7 (ref 5–15)
BUN: 13 mg/dL (ref 8–23)
CO2: 29 mmol/L (ref 22–32)
Calcium: 9.2 mg/dL (ref 8.9–10.3)
Chloride: 105 mmol/L (ref 98–111)
Creatinine: 0.58 mg/dL (ref 0.44–1.00)
GFR, Estimated: >60 mL/min (ref >60)
Glucose, Bld: 100 mg/dL — ABNORMAL HIGH (ref 70–99)
Potassium: 4.2 mmol/L (ref 3.5–5.1)
Sodium: 141 mmol/L (ref 135–145)
Total Bilirubin: 0.3 mg/dL (ref 0.3–1.2)
Total Protein: 6.8 g/dL (ref 6.5–8.1)

## 2022-07-01 MED ORDER — ACETAMINOPHEN 325 MG PO TABS
650.0000 mg | ORAL_TABLET | Freq: Once | ORAL | Status: AC
  Administered 2022-07-01: 650 mg via ORAL

## 2022-07-01 MED ORDER — SODIUM CHLORIDE 0.9% FLUSH
10.0000 mL | Freq: Once | INTRAVENOUS | Status: AC
  Administered 2022-07-01: 10 mL

## 2022-07-01 MED ORDER — ACETAMINOPHEN 325 MG PO TABS
ORAL_TABLET | ORAL | Status: AC
  Filled 2022-07-01: qty 2

## 2022-07-01 MED ORDER — HEPARIN SOD (PORK) LOCK FLUSH 100 UNIT/ML IV SOLN
500.0000 [IU] | Freq: Once | INTRAVENOUS | Status: AC
  Administered 2022-07-01: 500 [IU]

## 2022-07-01 MED ORDER — DOXYCYCLINE HYCLATE 100 MG PO TABS: 100.0000 mg | ORAL_TABLET | Freq: Two times a day (BID) | ORAL | 0 refills | Status: DC

## 2022-07-01 MED ORDER — SODIUM CHLORIDE 0.9 % IV SOLN
300.0000 mg | Freq: Once | INTRAVENOUS | Status: AC
  Administered 2022-07-01: 300 mg via INTRAVENOUS
  Filled 2022-07-01: qty 300

## 2022-07-01 MED ORDER — LORATADINE 10 MG PO TABS
10.0000 mg | ORAL_TABLET | Freq: Once | ORAL | Status: AC
  Administered 2022-07-01: 10 mg via ORAL

## 2022-07-01 MED ORDER — SODIUM CHLORIDE 0.9 % IV SOLN: Freq: Once | INTRAVENOUS | Status: AC

## 2022-07-01 MED ORDER — HEPARIN SOD (PORK) LOCK FLUSH 100 UNIT/ML IV SOLN
500.0000 [IU] | Freq: Once | INTRAVENOUS | Status: AC | PRN
  Administered 2022-07-01: 500 [IU]

## 2022-07-01 MED ORDER — SODIUM CHLORIDE 0.9% FLUSH
10.0000 mL | Freq: Once | INTRAVENOUS | Status: AC | PRN
  Administered 2022-07-01: 10 mL

## 2022-07-01 NOTE — Progress Notes (Signed)
Patient seen by PA today  Vitals are within treatment parameters.  Labs reviewed: and are within treatment parameters.  Per physician team, patient is ready for treatment. Please note that modifications are being made to the treatment plan including No  Nivolumab.  Patient will only be receiving fluids and Venofer today.  Infusion team aware.

## 2022-07-02 ENCOUNTER — Inpatient Hospital Stay: Payer: 59 | Admitting: Dietician

## 2022-07-02 ENCOUNTER — Telehealth: Payer: Self-pay

## 2022-07-02 ENCOUNTER — Inpatient Hospital Stay: Payer: 59

## 2022-07-02 ENCOUNTER — Encounter: Payer: Self-pay | Admitting: Internal Medicine

## 2022-07-02 NOTE — Telephone Encounter (Signed)
This nurse attempted to return call to Westley Chandler, NP with [facility] about treating patients aspiration pneumonia and pulmonary infiltrate.  This office has called in antibiotic for patient however provider recommends primary care follow up with a swallow evaluation.  Will attempt call again.     No further questions or concerns noted at this time.

## 2022-07-09 ENCOUNTER — Inpatient Hospital Stay: Payer: 59 | Admitting: Dietician

## 2022-07-09 ENCOUNTER — Inpatient Hospital Stay: Payer: 59

## 2022-07-16 ENCOUNTER — Other Ambulatory Visit: Payer: Self-pay

## 2022-07-16 ENCOUNTER — Encounter: Payer: Self-pay | Admitting: Internal Medicine

## 2022-07-16 ENCOUNTER — Emergency Department (HOSPITAL_COMMUNITY)
Admission: EM | Admit: 2022-07-16 | Discharge: 2022-07-16 | Disposition: A | Discharge status: Home / Self Care | Payer: 59 | Attending: Emergency Medicine | Admitting: Emergency Medicine

## 2022-07-16 ENCOUNTER — Emergency Department (HOSPITAL_COMMUNITY): Payer: 59

## 2022-07-16 ENCOUNTER — Encounter (HOSPITAL_COMMUNITY): Payer: Self-pay | Admitting: Emergency Medicine

## 2022-07-16 ENCOUNTER — Ambulatory Visit (INDEPENDENT_AMBULATORY_CARE_PROVIDER_SITE_OTHER): Payer: 59 | Admitting: Internal Medicine

## 2022-07-16 VITALS — BP 78/53 | HR 73 | Temp 97.5°F | Ht 66.0 in | Wt 119.7 lb

## 2022-07-16 DIAGNOSIS — R531 Weakness: Secondary | ICD-10-CM | POA: Diagnosis present

## 2022-07-16 DIAGNOSIS — K5520 Angiodysplasia of colon without hemorrhage: Secondary | ICD-10-CM | POA: Diagnosis not present

## 2022-07-16 DIAGNOSIS — J45909 Unspecified asthma, uncomplicated: Secondary | ICD-10-CM | POA: Diagnosis not present

## 2022-07-16 DIAGNOSIS — E860 Dehydration: Secondary | ICD-10-CM | POA: Insufficient documentation

## 2022-07-16 DIAGNOSIS — Z87891 Personal history of nicotine dependence: Secondary | ICD-10-CM | POA: Diagnosis not present

## 2022-07-16 DIAGNOSIS — I2510 Atherosclerotic heart disease of native coronary artery without angina pectoris: Secondary | ICD-10-CM | POA: Insufficient documentation

## 2022-07-16 DIAGNOSIS — J449 Chronic obstructive pulmonary disease, unspecified: Secondary | ICD-10-CM | POA: Insufficient documentation

## 2022-07-16 DIAGNOSIS — I10 Essential (primary) hypertension: Secondary | ICD-10-CM | POA: Diagnosis not present

## 2022-07-16 DIAGNOSIS — Z85118 Personal history of other malignant neoplasm of bronchus and lung: Secondary | ICD-10-CM | POA: Insufficient documentation

## 2022-07-16 DIAGNOSIS — I959 Hypotension, unspecified: Secondary | ICD-10-CM | POA: Diagnosis not present

## 2022-07-16 DIAGNOSIS — Z79899 Other long term (current) drug therapy: Secondary | ICD-10-CM | POA: Diagnosis not present

## 2022-07-16 DIAGNOSIS — D509 Iron deficiency anemia, unspecified: Secondary | ICD-10-CM

## 2022-07-16 DIAGNOSIS — K259 Gastric ulcer, unspecified as acute or chronic, without hemorrhage or perforation: Secondary | ICD-10-CM

## 2022-07-16 DIAGNOSIS — Z7951 Long term (current) use of inhaled steroids: Secondary | ICD-10-CM | POA: Insufficient documentation

## 2022-07-16 LAB — COMPREHENSIVE METABOLIC PANEL
ALT: 10 U/L (ref 0–44)
AST: 16 U/L (ref 15–41)
Albumin: 3.5 g/dL (ref 3.5–5.0)
Alkaline Phosphatase: 222 U/L — ABNORMAL HIGH (ref 38–126)
Anion gap: 9 (ref 5–15)
BUN: 15 mg/dL (ref 8–23)
CO2: 26 mmol/L (ref 22–32)
Calcium: 8.7 mg/dL — ABNORMAL LOW (ref 8.9–10.3)
Chloride: 105 mmol/L (ref 98–111)
Creatinine, Ser: 0.61 mg/dL (ref 0.44–1.00)
GFR, Estimated: >60 mL/min (ref >60)
Glucose, Bld: 103 mg/dL — ABNORMAL HIGH (ref 70–99)
Potassium: 4.2 mmol/L (ref 3.5–5.1)
Sodium: 140 mmol/L (ref 135–145)
Total Bilirubin: 0.4 mg/dL (ref 0.3–1.2)
Total Protein: 6.5 g/dL (ref 6.5–8.1)

## 2022-07-16 LAB — URINALYSIS, ROUTINE W REFLEX MICROSCOPIC
Bilirubin Urine: NEGATIVE
Glucose, UA: NEGATIVE mg/dL
Hgb urine dipstick: NEGATIVE
Ketones, ur: NEGATIVE mg/dL
Leukocytes,Ua: NEGATIVE
Nitrite: NEGATIVE
Protein, ur: NEGATIVE mg/dL
Specific Gravity, Urine: 1.014 (ref 1.005–1.030)
pH: 5 (ref 5.0–8.0)

## 2022-07-16 LAB — CBC WITH DIFFERENTIAL/PLATELET
Abs Immature Granulocytes: 0.02 10*3/uL (ref 0.00–0.07)
Basophils Absolute: 0 10*3/uL (ref 0.0–0.1)
Basophils Relative: 0 %
Eosinophils Absolute: 0.4 10*3/uL (ref 0.0–0.5)
Eosinophils Relative: 6 %
HCT: 30.1 % — ABNORMAL LOW (ref 36.0–46.0)
Hemoglobin: 9.2 g/dL — ABNORMAL LOW (ref 12.0–15.0)
Immature Granulocytes: 0 %
Lymphocytes Relative: 14 %
Lymphs Abs: 0.9 10*3/uL (ref 0.7–4.0)
MCH: 23.2 pg — ABNORMAL LOW (ref 26.0–34.0)
MCHC: 30.6 g/dL (ref 30.0–36.0)
MCV: 76 fL — ABNORMAL LOW (ref 80.0–100.0)
Monocytes Absolute: 0.5 10*3/uL (ref 0.1–1.0)
Monocytes Relative: 8 %
Neutro Abs: 4.7 10*3/uL (ref 1.7–7.7)
Neutrophils Relative %: 72 %
Platelets: 296 10*3/uL (ref 150–400)
RBC: 3.96 MIL/uL (ref 3.87–5.11)
RDW: 21.6 % — ABNORMAL HIGH (ref 11.5–15.5)
WBC: 6.6 10*3/uL (ref 4.0–10.5)
nRBC: 0 % (ref 0.0–0.2)

## 2022-07-16 MED ORDER — PANTOPRAZOLE SODIUM 40 MG PO TBEC: 40.0000 mg | DELAYED_RELEASE_TABLET | Freq: Two times a day (BID) | ORAL | 11 refills | Status: DC

## 2022-07-16 MED ORDER — SODIUM CHLORIDE 0.9 % IV BOLUS
1500.0000 mL | Freq: Once | INTRAVENOUS | Status: AC
  Administered 2022-07-16: 1500 mL via INTRAVENOUS

## 2022-07-16 MED ORDER — LACTATED RINGERS IV BOLUS
1000.0000 mL | Freq: Once | INTRAVENOUS | Status: AC
  Administered 2022-07-16: 1000 mL via INTRAVENOUS

## 2022-07-16 NOTE — Discharge Instructions (Addendum)
We evaluated you for your low blood pressure.  Your symptoms are likely due to dehydration.  Please make sure you are drinking lots of fluids.  Please follow-up closely with your primary doctor and your oncologist.  Return to the emergency department if you develop any fevers, increased weakness, fainting, productive cough, painful urination, abdominal pain, diarrhea or any other new symptoms.

## 2022-07-16 NOTE — Progress Notes (Signed)
Referring Provider: [name], FNP Primary Care Physician:  [name], FNP Primary GI:  Dr. Abbey Chatters  Chief Complaint  Patient presents with   Follow-up    Patient here today for a follow up visit. Patient had EGD done on 05/19/2022, and has a history of Avm of small bowel, Iron Def anemia, and Hypotension. Patient says at times she has seen dark stools, Denies any sight of bright red blood. 07/01/2022 hgb 9.2. Iron Sat % 5 Jun 12, 2022. Patient states she has some shortness of breath all the time, she is on Continous O2, has dizziness,chest discomfort at times, weakness/ fatigue. Patient bp was low today at office visit and we gave patient some nabs&coffe    HPI:   Courtney Zuniga is a 72 y.o. female who presents to the clinic today for hospital follow-up visit.  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  Admitted to [facility] 05/18/2022 after presenting with abdominal pain, melena, generalized weakness/fatigue.  Admitted with bilateral pneumonia, found to have heme positive stool, acute on chronic anemia.  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  Multiple other small angiodysplasias were then treated as well.  Patient improved, discharged home on twice daily PPI.  Taking oral iron.  Most recent hemoglobin 9.2 on 07/01/2022.  During her admission got as low as 7.5.  At the time she was taking BC powders but states she discontinued these.  Today states she is doing well.  No further melena hematochezia.  No abdominal  pain.  She is quite hypotensive today in the office with blood pressure initially 70/43.  After drinking water and coffee improved to 78/53.  Patient has generalized weakness/fatigue.  States she has no energy.  No chest pain.  Past Medical History:  Diagnosis Date   Anemia    as a young woman   Arthritis    osteoartritis   Asthma    Brain tumor (benign) 2005 [facility]   Benign   Chronic headaches    Chronic hip pain    Chronic pain    COPD (chronic obstructive pulmonary disease)    Coronary artery disease    Depression    Depression 05/15/2016   Encounter for antineoplastic chemotherapy 01/10/2016   GERD (gastroesophageal reflux disease)    Hypertension    Lung cancer dx'd 01/2016   currently on chemo and radiation    NSTEMI (non-ST elevated myocardial infarction) yrs ago   On home O2    qhs 2 liters at hs and prn   Pneumonia last time 2 yrs ago   Shortness of breath dyspnea    with activity    Past Surgical History:  Procedure Laterality Date   CHOLECYSTECTOMY     COLONOSCOPY  2015   Results requested from [facility]   COLONOSCOPY     ESOPHAGOGASTRODUODENOSCOPY N/A 08/14/2015   Procedure: ESOPHAGOGASTRODUODENOSCOPY (EGD);  Surgeon: Daneil Dolin, MD;  Location: [facility];  Service: Endoscopy;  Laterality: N/A;  215    ESOPHAGOGASTRODUODENOSCOPY (EGD) WITH PROPOFOL N/A 09/13/2015   Procedure: ESOPHAGOGASTRODUODENOSCOPY (EGD) WITH PROPOFOL;  Surgeon: Milus Banister, MD;  Location: [facility];  Service: Endoscopy;  Laterality: N/A;   ESOPHAGOGASTRODUODENOSCOPY (EGD) WITH PROPOFOL N/A 05/19/2022   Procedure: ESOPHAGOGASTRODUODENOSCOPY (EGD) WITH PROPOFOL;  Surgeon: Eloise Harman, DO;  Location: [facility];  Service: Endoscopy;  Laterality: N/A;   EUS N/A 03/12/2017   Procedure: UPPER ENDOSCOPIC ULTRASOUND (EUS) RADIAL;  Surgeon: Milus Banister, MD;  Location: [facility];  Service: Endoscopy;  Laterality: N/A;   HOT HEMOSTASIS  05/19/2022   Procedure:  HOT HEMOSTASIS (ARGON PLASMA COAGULATION/BICAP);  Surgeon: Eloise Harman, DO;  Location: [facility];  Service: Endoscopy;;   IR IMAGING GUIDED PORT INSERTION  01/20/2022   TUMOR REMOVAL  2005   Benign   UPPER ESOPHAGEAL ENDOSCOPIC ULTRASOUND (EUS)  09/13/2015   Procedure: UPPER ESOPHAGEAL ENDOSCOPIC ULTRASOUND (EUS);  Surgeon: Milus Banister, MD;  Location: [facility];  Service: Endoscopy;;   VIDEO BRONCHOSCOPY WITH ENDOBRONCHIAL NAVIGATION N/A 12/31/2015   Procedure: VIDEO BRONCHOSCOPY WITH ENDOBRONCHIAL NAVIGATION;  Surgeon: Melrose Nakayama, MD;  Location: [place];  Service: Thoracic;  Laterality: N/A;   VIDEO BRONCHOSCOPY WITH ENDOBRONCHIAL ULTRASOUND N/A 11/08/2015   Procedure: VIDEO BRONCHOSCOPY WITH ENDOBRONCHIAL ULTRASOUND;  Surgeon: Ivin Poot, MD;  Location: [facility];  Service: Thoracic;  Laterality: N/A;    Current Outpatient Medications  Medication Sig Dispense Refill   acetaminophen (TYLENOL) 325 MG tablet Take 2 tablets (650 mg total) by mouth every 6 (six) hours as needed for mild pain, fever or headache. 12 tablet 2   albuterol (VENTOLIN HFA) 108 (90 Base) MCG/ACT inhaler Inhale 2 puffs into the lungs every 4 (four) hours as needed for shortness of breath. 18 g 3   DULoxetine (CYMBALTA) 60 MG capsule Take 1 capsule (60 mg total) by mouth 2 (two) times daily. 180 capsule 3   ENSURE (ENSURE) Take 237 mLs by mouth 3 (three) times daily between meals.     FeFum-FePoly-FA-B Cmp-C-Biot (INTEGRA PLUS) CAPS Take 1 capsule by mouth every morning. 30 capsule 2   fluticasone furoate-vilanterol (BREO ELLIPTA) 200-25 MCG/ACT AEPB Inhale 1 puff into the lungs daily. 60 each 4   Ipratropium-Albuterol (COMBIVENT RESPIMAT) 20-100 MCG/ACT AERS respimat Inhale 1 puff into the lungs every 6 (six) hours as needed for wheezing or shortness of breath. 4 g 0   ipratropium-albuterol (DUONEB) 0.5-2.5 (3) MG/3ML SOLN Take 3 mLs by nebulization every 6 (six) hours as needed. 360 mL 2   megestrol  (MEGACE ES) 625 MG/5ML suspension Take 5 mLs (625 mg total) by mouth daily. 150 mL 0   oxyCODONE-acetaminophen (PERCOCET/ROXICET) 5-325 MG tablet Take 1 tablet by mouth every 8 (eight) hours as needed for moderate pain. 12 tablet 0   prochlorperazine (COMPAZINE) 10 MG tablet Take 1 tablet (10 mg total) by mouth every 6 (six) hours as needed for nausea or vomiting. 30 tablet 0   tamsulosin (FLOMAX) 0.4 MG CAPS capsule Take 1 capsule (0.4 mg total) by mouth daily after supper. 30 capsule 3   topiramate (TOPAMAX) 25 MG tablet Take 1 tablet (25 mg total) by mouth 2 (two) times daily. 45 tablet 2   TRELEGY ELLIPTA 100-62.5-25 MCG/ACT AEPB Inhale 1 puff into the lungs daily. 60 each 3   Vitamin D, Ergocalciferol, (DRISDOL) 1.25 MG (50000 UNIT) CAPS capsule Take 1 capsule (50,000 Units total) by mouth once a week. 5 capsule 3   pantoprazole (PROTONIX) 40 MG tablet Take 1 tablet (40 mg total) by mouth 2 (two) times daily. 60 tablet 11  No current facility-administered medications for this visit.    Allergies as of 07/16/2022 - Review Complete 07/16/2022  Allergen Reaction Noted   Desyrel [trazodone]  07/17/2021    Family History  Problem Relation Age of Onset   Ovarian cancer Mother    Lung cancer Father    Brain cancer Sister        20 sister   Lung cancer Brother    Lung cancer Brother    Prostate cancer Brother    Colon cancer Neg Hx    Colon polyps Neg Hx     Social History   Socioeconomic History   Marital status: Single    Spouse name: Not on file   Number of children: Not on file   Years of education: 10th grade   Highest education level: Not on file  Occupational History   Occupation: retired  Tobacco Use   Smoking status: Former    Packs/day: 1.00    Years: 38.00    Additional pack years: 0.00    Total pack years: 38.00    Types: Cigarettes    Quit date: 07/26/2018    Years since quitting: 3.9   Smokeless tobacco: Never   Tobacco comments:    Pt has asked doctor  for med to help   Vaping Use   Vaping Use: Never used  Substance and Sexual Activity   Alcohol use: No    Alcohol/week: 0.0 standard drinks of alcohol   Drug use: No   Sexual activity: Not Currently  Other Topics Concern   Not on file  Social History Narrative   Not on file   Social Determinants of Health   Financial Resource Strain: Low Risk  (05/05/2018)   Overall Financial Resource Strain (CARDIA)    Difficulty of Paying Living Expenses: Not very hard  Food Insecurity: No Food Insecurity (05/18/2022)   Hunger Vital Sign    Worried About Running Out of Food in the Last Year: Never true    [place] in the Last Year: Never true  Transportation Needs: No Transportation Needs (05/18/2022)   PRAPARE - Hydrologist (Medical): No    Lack of Transportation (Non-Medical): No  Physical Activity: Insufficiently Active (05/05/2018)   Exercise Vital Sign    Days of Exercise per Week: 5 days    Minutes of Exercise per Session: 10 min  Stress: No Stress Concern Present (05/05/2018)   [place]    Feeling of Stress : Not at all  Social Connections: Somewhat Isolated (05/05/2018)   Social Connection and Isolation Panel [NHANES]    Frequency of Communication with Friends and Family: More than three times a week    Frequency of Social Gatherings with Friends and Family: More than three times a week    Attends Religious Services: More than 4 times per year    Active Member of [employer] or Organizations: No    Attends Archivist Meetings: Never    Marital Status: Never married    Subjective: Review of Systems  Constitutional:  Positive for malaise/fatigue. Negative for chills and fever.  HENT:  Negative for congestion and hearing loss.   Eyes:  Negative for blurred vision and double vision.  Respiratory:  Negative for cough and shortness of breath.   Cardiovascular:  Negative for chest  pain and palpitations.  Gastrointestinal:  Negative for abdominal pain, blood in stool, constipation, diarrhea, heartburn, melena and vomiting.  Genitourinary:  Negative for dysuria and urgency.  Musculoskeletal:  Negative for joint pain and myalgias.  Skin:  Negative for itching and rash.  Neurological:  Negative for dizziness and headaches.  Psychiatric/Behavioral:  Negative for depression. The patient is not nervous/anxious.      Objective: BP (!) 78/53   Pulse 73   Temp (!) 97.5 F (36.4 C) (Temporal)   Ht 5\' 6"  (1.676 m)   Wt 119 lb 11.2 oz (54.3 kg)   BMI 19.32 kg/m  Physical Exam Constitutional:      Appearance: Normal appearance.  HENT:     Head: Normocephalic and atraumatic.  Eyes:     Extraocular Movements: Extraocular movements intact.     Conjunctiva/sclera: Conjunctivae normal.  Cardiovascular:     Rate and Rhythm: Normal rate and regular rhythm.  Pulmonary:     Effort: Pulmonary effort is normal.     Breath sounds: Normal breath sounds.  Abdominal:     General: Bowel sounds are normal.     Palpations: Abdomen is soft.  Musculoskeletal:        General: No swelling. Normal range of motion.     Cervical back: Normal range of motion and neck supple.  Skin:    General: Skin is warm and dry.     Coloration: Skin is not jaundiced.  Neurological:     General: No focal deficit present.     Mental Status: She is alert and oriented to person, place, and time.  Psychiatric:        Mood and Affect: Mood normal.        Behavior: Behavior normal.      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  Hemoglobin improved.  Continue oral iron.  Continue pantoprazole twice daily, refills sent to pharmacy today.  Continue follow-up with hematology/oncology.  Patient is quite hypotensive in office today.  Despite fluids and coffee, blood  pressure improved to only 78/53.  Recommend evaluation in the emergency room.  I have called and spoken to provider with updates.  Follow-up in 2 to 3 months.  Will need EGD to reevaluate ulcer healing though we will hold off scheduling for now.  07/16/2022 12:47 PM   Disclaimer: This note was dictated with voice recognition software. Similar sounding words can inadvertently be transcribed and may not be corrected upon review.

## 2022-07-16 NOTE — Patient Instructions (Signed)
I am happy to hear that you are doing better from a GI standpoint.  Continue on pantoprazole twice daily.  I sent refills to your pharmacy.  Continue to avoid NSAIDs including BC powders.  Your blood pressure is quite low today.  I discussed with your cancer doctor who agreed that you need to be evaluated in the emergency room.  I will call them now to hopefully expedite the process.  We will need to perform EGD to evaluate healing of previously noted ulcer at some point though we will hold off for now.  It was very nice seeing both you today.  Dr. Abbey Chatters

## 2022-07-16 NOTE — ED Provider Notes (Signed)
[facility] Provider Note  CSN: [id number] Arrival date & time: 07/16/22 1304  Chief Complaint(s) Hypotension  HPI Courtney Zuniga is a 72 y.o. female with history of lung cancer, undergoing treatment, COPD on home oxygen, hypertension, chronic headaches presenting to the emergency department with low blood pressure.  Patient was seen in the gastroenterology office today, was recently admitted for GI bleed, was noted to have low blood pressure with systolic of [id number].  She does report increased weakness over the past week.  She reports that she has been keeping up with fluid intake.  She does report a cough which has been present for the past week as well, nonproductive.  No fevers.  No chest pain or abdominal pain.  No nausea or vomiting.  No hematemesis, black stools.  No lightheadedness or fainting.  She does report headache, reports chronic headaches but reports today's headache is worse.  No numbness or tingling, weakness, vision changes, seizure.   Past Medical History Past Medical History:  Diagnosis Date   Anemia    as a young woman   Arthritis    osteoartritis   Asthma    Brain tumor (benign) 2005 [facility]   Benign   Chronic headaches    Chronic hip pain    Chronic pain    COPD (chronic obstructive pulmonary disease)    Coronary artery disease    Depression    Depression 05/15/2016   Encounter for antineoplastic chemotherapy 01/10/2016   GERD (gastroesophageal reflux disease)    Hypertension    Lung cancer dx'd 01/2016   currently on chemo and radiation    NSTEMI (non-ST elevated myocardial infarction) yrs ago   On home O2    qhs 2 liters at hs and prn   Pneumonia last time 2 yrs ago   Shortness of breath dyspnea    with activity   Patient Active Problem List   Diagnosis Date Noted   Hypotension 06/12/2022   Iron deficiency anemia 06/12/2022   Acute on chronic anemia 05/20/2022   Gastric ulcer 05/20/2022   AVM (arteriovenous  malformation) of small bowel, 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   Upper GI bleed 05/18/2022   Acute blood loss anemia 05/18/2022   Acute respiratory failure with hypoxia 04/23/2022   Influenza B 04/23/2022   Frequent PVCs 04/09/2022   Port-A-Cath in place 03/03/2022   Increased frequency of headaches 10/18/2020   COPD exacerbation 11/05/2019   Chronic nonintractable headache    Acute on chronic respiratory failure with hypoxia and hypercapnia 06/16/2018   Influenza A 06/16/2018   Severe protein-calorie malnutrition 06/16/2018   GERD (gastroesophageal reflux disease) 06/16/2018   Bilateral pneumonia 05/05/2018   Sepsis due to CAP 05/05/2018   Chronic respiratory failure 05/05/2018   Encounter for antineoplastic immunotherapy 04/23/2017   Depression 05/15/2016   Adenocarcinoma of right lung, stage 3 01/10/2016   Encounter for antineoplastic chemotherapy 01/10/2016   Lung mass 01/03/2016   Arthritis    Subepithelial gastric mass    Mucosal abnormality of stomach    Abdominal pain 08/08/2015   Abnormal CT of the abdomen 08/08/2015   Influenza with pneumonia 06/27/2014   Hypoxia 06/26/2014   COPD with acute exacerbation 06/26/2014   CAP (community acquired pneumonia)    Chest pain of uncertain etiology    Asthma, chronic    Chest pain 02/16/2014   HTN (hypertension) 02/16/2014   DDD (degenerative disc disease), lumbar 02/04/2011   Lumbar herniated disc 01/09/2011   Home Medication(s) Prior to Admission  medications   Medication Sig Start Date End Date Taking? Authorizing Provider  acetaminophen (TYLENOL) 325 MG tablet Take 2 tablets (650 mg total) by mouth every 6 (six) hours as needed for mild pain, fever or headache. 05/08/18   Denton Brick, Courage, MD  albuterol (VENTOLIN HFA) 108 (90 Base) MCG/ACT inhaler Inhale 2 puffs into the lungs every 4 (four) hours as needed for shortness of breath. 04/26/22   Roxan Hockey, MD  DULoxetine (CYMBALTA) 60 MG capsule Take 1 capsule (60 mg total) by mouth  2 (two) times daily. 04/26/22   Emokpae, Courage, MD  ENSURE (ENSURE) Take 237 mLs by mouth 3 (three) times daily between meals.    [provider]  FeFum-FePoly-FA-B Cmp-C-Biot (INTEGRA PLUS) CAPS Take 1 capsule by mouth every morning. 06/12/22   Heilingoetter, Cassandra L, PA-C  fluticasone furoate-vilanterol (BREO ELLIPTA) 200-25 MCG/ACT AEPB Inhale 1 puff into the lungs daily. 04/26/22   Roxan Hockey, MD  Ipratropium-Albuterol (COMBIVENT RESPIMAT) 20-100 MCG/ACT AERS respimat Inhale 1 puff into the lungs every 6 (six) hours as needed for wheezing or shortness of breath. 04/26/22   Emokpae, Courage, MD  ipratropium-albuterol (DUONEB) 0.5-2.5 (3) MG/3ML SOLN Take 3 mLs by nebulization every 6 (six) hours as needed. 04/26/22   Roxan Hockey, MD  megestrol (MEGACE ES) 625 MG/5ML suspension Take 5 mLs (625 mg total) by mouth daily. 01/29/22   Heilingoetter, Cassandra L, PA-C  oxyCODONE-acetaminophen (PERCOCET/ROXICET) 5-325 MG tablet Take 1 tablet by mouth every 8 (eight) hours as needed for moderate pain. 05/08/18   Roxan Hockey, MD  pantoprazole (PROTONIX) 40 MG tablet Take 1 tablet (40 mg total) by mouth 2 (two) times daily. 07/16/22 07/16/23  Eloise Harman, DO  prochlorperazine (COMPAZINE) 10 MG tablet Take 1 tablet (10 mg total) by mouth every 6 (six) hours as needed for nausea or vomiting. 04/26/22   Roxan Hockey, MD  tamsulosin (FLOMAX) 0.4 MG CAPS capsule Take 1 capsule (0.4 mg total) by mouth daily after supper. 04/26/22   Roxan Hockey, MD  topiramate (TOPAMAX) 25 MG tablet Take 1 tablet (25 mg total) by mouth 2 (two) times daily. 04/26/22   Emokpae, Courage, MD  TRELEGY ELLIPTA 100-62.5-25 MCG/ACT AEPB Inhale 1 puff into the lungs daily. 04/26/22   Roxan Hockey, MD  Vitamin D, Ergocalciferol, (DRISDOL) 1.25 MG (50000 UNIT) CAPS capsule Take 1 capsule (50,000 Units total) by mouth once a week. 04/26/22   Roxan Hockey, MD  HYDROcodone bit-homatropine (HYCODAN) 5-1.5  MG/5ML syrup Take 5 mLs by mouth every 6 (six) hours as needed for cough. 11/10/21   Curt Bears, MD                                                                                                                                    Past Surgical History Past Surgical History:  Procedure Laterality Date   CHOLECYSTECTOMY     COLONOSCOPY  2015   [facility]   COLONOSCOPY     ESOPHAGOGASTRODUODENOSCOPY N/A 08/14/2015   Procedure: ESOPHAGOGASTRODUODENOSCOPY (EGD);  Surgeon: Daneil Dolin, MD;  Location: [facility];  Service: Endoscopy;  Laterality: N/A;  215    ESOPHAGOGASTRODUODENOSCOPY (EGD) WITH PROPOFOL N/A 09/13/2015   Procedure: ESOPHAGOGASTRODUODENOSCOPY (EGD) WITH PROPOFOL;  Surgeon: Milus Banister, MD;  Location: [facility];  Service: Endoscopy;  Laterality: N/A;   ESOPHAGOGASTRODUODENOSCOPY (EGD) WITH PROPOFOL N/A 05/19/2022   Procedure: ESOPHAGOGASTRODUODENOSCOPY (EGD) WITH PROPOFOL;  Surgeon: Eloise Harman, DO;  Location: [facility];  Service: Endoscopy;  Laterality: N/A;   EUS N/A 03/12/2017   Procedure: UPPER ENDOSCOPIC ULTRASOUND (EUS) RADIAL;  Surgeon: Milus Banister, MD;  Location: [facility];  Service: Endoscopy;  Laterality: N/A;   HOT HEMOSTASIS  05/19/2022   Procedure: HOT HEMOSTASIS (ARGON PLASMA COAGULATION/BICAP);  Surgeon: Eloise Harman, DO;  Location: [facility];  Service: Endoscopy;;   IR IMAGING GUIDED PORT INSERTION  01/20/2022   TUMOR REMOVAL  2005   Benign   UPPER ESOPHAGEAL ENDOSCOPIC ULTRASOUND (EUS)  09/13/2015   Procedure: UPPER ESOPHAGEAL ENDOSCOPIC ULTRASOUND (EUS);  Surgeon: Milus Banister, MD;  Location: [facility];  Service: Endoscopy;;   VIDEO BRONCHOSCOPY WITH ENDOBRONCHIAL NAVIGATION N/A 12/31/2015   Procedure: VIDEO BRONCHOSCOPY WITH ENDOBRONCHIAL NAVIGATION;  Surgeon: Melrose Nakayama, MD;  Location: [facility];  Service: Thoracic;  Laterality: N/A;   VIDEO BRONCHOSCOPY WITH ENDOBRONCHIAL  ULTRASOUND N/A 11/08/2015   Procedure: VIDEO BRONCHOSCOPY WITH ENDOBRONCHIAL ULTRASOUND;  Surgeon: Ivin Poot, MD;  Location: [facility];  Service: Thoracic;  Laterality: N/A;   Family History Family History  Problem Relation Age of Onset   Ovarian cancer Mother    Lung cancer Father    Brain cancer Sister        51 sister   Lung cancer Brother    Lung cancer Brother    Prostate cancer Brother    Colon cancer Neg Hx    Colon polyps Neg Hx     Social History Social History   Tobacco Use   Smoking status: Former    Packs/day: 1.00    Years: 38.00    Additional pack years: 0.00    Total pack years: 38.00    Types: Cigarettes    Quit date: 07/26/2018    Years since quitting: 3.9   Smokeless tobacco: Never   Tobacco comments:    Pt has asked doctor for med to help   Vaping Use   Vaping Use: Never used  Substance Use Topics   Alcohol use: No    Alcohol/week: 0.0 standard drinks of alcohol   Drug use: No   Allergies Desyrel [trazodone]  Review of Systems Review of Systems  All other systems reviewed and are negative.   Physical Exam Vital Signs  I have reviewed the triage vital signs BP 97/70   Pulse 87   Temp (!) 97.5 F (36.4 C)   Resp 19   Ht 5\' 6"  (1.676 m)   Wt 54.3 kg   SpO2 100%   BMI 19.32 kg/m  Physical Exam Vitals and nursing note reviewed.  Constitutional:      General: She is not in acute distress.    Appearance: She is well-developed.  HENT:     Head: Normocephalic and atraumatic.     Mouth/Throat:     Mouth: Mucous membranes are dry.  Eyes:     Pupils: Pupils are equal, round, and reactive to light.  Cardiovascular:  Rate and Rhythm: Normal rate and regular rhythm.     Heart sounds: No murmur heard. Pulmonary:     Effort: Pulmonary effort is normal. No respiratory distress.     Breath sounds: Normal breath sounds.  Abdominal:     General: Abdomen is flat.     Palpations: Abdomen is soft.     Tenderness: There is no abdominal  tenderness.  Musculoskeletal:        General: No tenderness.     Right lower leg: No edema.     Left lower leg: No edema.  Skin:    General: Skin is warm and dry.  Neurological:     General: No focal deficit present.     Mental Status: She is alert. Mental status is at baseline.  Psychiatric:        Mood and Affect: Mood normal.        Behavior: Behavior normal.     ED Results and Treatments Labs (all labs ordered are listed, but only abnormal results are displayed) Labs Reviewed  CBC WITH DIFFERENTIAL/PLATELET - Abnormal; Notable for the following components:      Result Value   Hemoglobin 9.2 (*)    HCT 30.1 (*)    MCV 76.0 (*)    MCH 23.2 (*)    RDW 21.6 (*)    All other components within normal limits  COMPREHENSIVE METABOLIC PANEL - Abnormal; Notable for the following components:   Glucose, Bld 103 (*)    Calcium 8.7 (*)    Alkaline Phosphatase 222 (*)    All other components within normal limits  CULTURE, BLOOD (ROUTINE X 2)  CULTURE, BLOOD (ROUTINE X 2)  URINALYSIS, ROUTINE W REFLEX MICROSCOPIC                                                                                                                          Radiology CT Head Wo Contrast  Result Date: 07/16/2022 CLINICAL DATA:  Headaches EXAM: CT HEAD WITHOUT CONTRAST TECHNIQUE: Contiguous axial images were obtained from the base of the skull through the vertex without intravenous contrast. RADIATION DOSE REDUCTION: This exam was performed according to the departmental dose-optimization program which includes automated exposure control, adjustment of the mA and/or kV according to patient size and/or use of iterative reconstruction technique. COMPARISON:  04/05/2019 FINDINGS: Brain: No acute intracranial findings are seen. There are no signs of bleeding within the cranium. Cortical sulci are prominent. There is decreased density in periventricular white matter. There is calcification in the right side of tentorium  with no significant change. There is no focal edema or mass effect. Vascular: Scattered arterial calcifications are seen. Skull: No acute findings are seen. Mottled appearance and scattered calcifications in calvarium have not changed significantly. Sinuses/Orbits: No acute findings are seen. There is fluid density in many right mastoid air cells, possibly suggesting right mastoid effusion. Other: None. IMPRESSION: No acute intracranial findings are seen in noncontrast CT brain. Atrophy. Small-vessel disease. Electronically Signed  By: Elmer Picker M.D.   On: 07/16/2022 17:23   DG Chest Port 1 View  Result Date: 07/16/2022 CLINICAL DATA:  Cough. Hypotension. History of small-cell lung cancer. EXAM: PORTABLE CHEST 1 VIEW COMPARISON:  X-ray 05/18/2022.  CT scan 06/25/2022 and older FINDINGS: Hyperinflation. Decreasing basilar atelectasis with some residual. There is also bandlike opacity right perihilar. Similar to previous when adjusting for technique. Underlying diffuse chronic lung changes. No pneumothorax, effusion or consolidation. Stable cardiopericardial silhouette with calcified aorta. Right IJ chest port with tip along the central SVC near the right atrium. Apical pleural thickening. Stable right apical nodule. IMPRESSION: Chest port. Hyperinflation with chronic lung changes. Decreasing basilar atelectasis with residual bandlike opacity. Stable right perihilar opacity. Electronically Signed   By: Jill Side M.D.   On: 07/16/2022 16:17    Pertinent labs & imaging results that were available during my care of the patient were reviewed by me and considered in my medical decision making (see MDM for details).  Medications Ordered in ED Medications  lactated ringers bolus 1,000 mL (0 mLs Intravenous Stopped 07/16/22 1726)  sodium chloride 0.9 % bolus 1,500 mL (0 mLs Intravenous Stopped 07/16/22 1853)                                                                                                                                      Procedures Procedures  (including critical care time)  Medical Decision Making / ED Course   MDM:  72 year old female presenting to the emergency department for low blood pressure.  Patient well-appearing overall, blood pressure here is slightly low although not as low as in gastroenterology office.  No focal pulmonary findings on exam.  Reviewed, patient has had issues with low blood pressure in oncology office, she does appear very dehydrated on exam, fluids and reassess.  She reports a cough, will obtain chest x-ray, no findings on exam.  Labs are reassuring, no signs of acute kidney injury.  She was recently admitted for GI bleed, she reports compliance and saw GI today who had low concern for ongoing bleeding, and her hemoglobin today is stable, patient denies any melena.  She is not having fevers but will obtain blood cultures given low blood pressure and patient chemotherapy patient.  Will reassess.  Clinical Course as of 07/16/22 2026  Wed Jul 16, 2022  2023 Workup overall reassuring. Orthostatic vitals positive. Urinalysis bland with no signs of infection.  No pneumonia on chest x-ray.  Patient reports feeling better after fluids.  Able to ambulate without symptoms.  Suspect significant dehydration.  Discussed increasing fluid intake.  Blood cultures were obtained, discussed with patient that if these return positive she will be called back.  Also discussed return precautions for any fevers or chills. Will discharge patient to home. All questions answered. Patient comfortable with plan of discharge. Return precautions discussed with patient and specified on the after visit summary. [WS]  Clinical Course User Index [WS] Cristie Hem, MD     Additional history obtained: -Additional history obtained from ems -External records from outside source obtained and reviewed including: Chart review including previous notes, labs, imaging,  consultation notes including prior oncology notes where patient has had low blood pressure, GI visit today   Lab Tests: -I ordered, reviewed, and interpreted labs.   The pertinent results include:   Labs Reviewed  CBC WITH DIFFERENTIAL/PLATELET - Abnormal; Notable for the following components:      Result Value   Hemoglobin 9.2 (*)    HCT 30.1 (*)    MCV 76.0 (*)    MCH 23.2 (*)    RDW 21.6 (*)    All other components within normal limits  COMPREHENSIVE METABOLIC PANEL - Abnormal; Notable for the following components:   Glucose, Bld 103 (*)    Calcium 8.7 (*)    Alkaline Phosphatase 222 (*)    All other components within normal limits  CULTURE, BLOOD (ROUTINE X 2)  CULTURE, BLOOD (ROUTINE X 2)  URINALYSIS, ROUTINE W REFLEX MICROSCOPIC    Notable for chronic anemia. No AKI. No leukocytosis     Imaging Studies ordered: I ordered imaging studies including CXR On my interpretation imaging demonstrates no acute process I independently visualized and interpreted imaging. I agree with the radiologist interpretation   Medicines ordered and prescription drug management: Meds ordered this encounter  Medications   lactated ringers bolus 1,000 mL   sodium chloride 0.9 % bolus 1,500 mL    -I have reviewed the patients home medicines and have made adjustments as needed  Cardiac Monitoring: The patient was maintained on a cardiac monitor.  I personally viewed and interpreted the cardiac monitored which showed an underlying rhythm of: NSR   Reevaluation: After the interventions noted above, I reevaluated the patient and found that their symptoms have improved  Co morbidities that complicate the patient evaluation  Past Medical History:  Diagnosis Date   Anemia    as a young woman   Arthritis    osteoartritis   Asthma    Brain tumor (benign) 2005 [facility]   Benign   Chronic headaches    Chronic hip pain    Chronic pain    COPD (chronic obstructive pulmonary disease)     Coronary artery disease    Depression    Depression 05/15/2016   Encounter for antineoplastic chemotherapy 01/10/2016   GERD (gastroesophageal reflux disease)    Hypertension    Lung cancer dx'd 01/2016   currently on chemo and radiation    NSTEMI (non-ST elevated myocardial infarction) yrs ago   On home O2    qhs 2 liters at hs and prn   Pneumonia last time 2 yrs ago   Shortness of breath dyspnea    with activity      Dispostion: Disposition decision including need for hospitalization was considered, and patient discharged from emergency department.    Final Clinical Impression(s) / ED Diagnoses Final diagnoses:  Dehydration     This chart was dictated using voice recognition software.  Despite best efforts to proofread,  errors can occur which can change the documentation meaning.    Cristie Hem, MD 07/16/22 2026

## 2022-07-16 NOTE — ED Notes (Signed)
Orthos completed. Pt denies dizziness or any other complaints. Just a slight headache.

## 2022-07-16 NOTE — ED Triage Notes (Signed)
Pt was sent over by GI doctor due to blood pressure being in the 70s.

## 2022-07-16 NOTE — ED Provider Triage Note (Signed)
Emergency Medicine Provider Triage Evaluation Note  Courtney Zuniga , a 72 y.o. female  was evaluated in triage.  Pt complains of low blood pressure.  Was sent in here from GI appointment given her blood pressure systolically in the [id number].  It appears the patient consistently has low blood pressure.  She does not feel fatigued or have any chest pain or shortness of breath.  Feeling member at bedside reports that she is at her baseline.  She has not had any any diarrhea or any dysuria.  Review of Systems  Positive:  Negative:   Physical Exam  BP 92/60 (BP Location: Left Arm)   Pulse 90   Temp 98 F (36.7 C) (Oral)   Resp (!) 22   Ht 5\' 6"  (1.676 m)   Wt 54.3 kg   SpO2 100%   BMI 19.32 kg/m  Gen:   Awake, no distress   Resp:  Normal effort  MSK:   Moves extremities without difficulty  Other:    Medical Decision Making  Medically screening exam initiated at 1:36 PM.  Appropriate orders placed.  Eloni Kershaw was informed that the remainder of the evaluation will be completed by another provider, this initial triage assessment does not replace that evaluation, and the importance of remaining in the ED until their evaluation is complete.  Labs ordered.    Sherrell Puller, PA-C 07/16/22 1336

## 2022-07-17 ENCOUNTER — Other Ambulatory Visit: Payer: Self-pay

## 2022-07-21 LAB — CULTURE, BLOOD (ROUTINE X 2)
Culture: NO GROWTH
Culture: NO GROWTH
Special Requests: ADEQUATE
Special Requests: ADEQUATE

## 2022-08-11 ENCOUNTER — Other Ambulatory Visit: Payer: Self-pay | Admitting: Physician Assistant

## 2022-08-11 DIAGNOSIS — J690 Pneumonitis due to inhalation of food and vomit: Secondary | ICD-10-CM

## 2022-08-11 NOTE — Telephone Encounter (Signed)
Needs to be evaluated.

## 2022-08-25 ENCOUNTER — Emergency Department (HOSPITAL_COMMUNITY): Payer: 59

## 2022-08-25 ENCOUNTER — Other Ambulatory Visit: Payer: Self-pay

## 2022-08-25 ENCOUNTER — Encounter (HOSPITAL_COMMUNITY): Payer: Self-pay

## 2022-08-25 ENCOUNTER — Inpatient Hospital Stay (HOSPITAL_COMMUNITY)
Admission: EM | Admit: 2022-08-25 | Discharge: 2022-08-28 | DRG: 189 | Disposition: A | Discharge status: Home Health | Payer: 59 | Attending: Family Medicine | Admitting: Family Medicine

## 2022-08-25 DIAGNOSIS — Z85118 Personal history of other malignant neoplasm of bronchus and lung: Secondary | ICD-10-CM | POA: Diagnosis not present

## 2022-08-25 DIAGNOSIS — I252 Old myocardial infarction: Secondary | ICD-10-CM | POA: Diagnosis not present

## 2022-08-25 DIAGNOSIS — M7989 Other specified soft tissue disorders: Secondary | ICD-10-CM | POA: Diagnosis present

## 2022-08-25 DIAGNOSIS — Z86011 Personal history of benign neoplasm of the brain: Secondary | ICD-10-CM

## 2022-08-25 DIAGNOSIS — E43 Unspecified severe protein-calorie malnutrition: Secondary | ICD-10-CM | POA: Diagnosis present

## 2022-08-25 DIAGNOSIS — R54 Age-related physical debility: Secondary | ICD-10-CM | POA: Diagnosis present

## 2022-08-25 DIAGNOSIS — J441 Chronic obstructive pulmonary disease with (acute) exacerbation: Secondary | ICD-10-CM

## 2022-08-25 DIAGNOSIS — Z87891 Personal history of nicotine dependence: Secondary | ICD-10-CM

## 2022-08-25 DIAGNOSIS — I2510 Atherosclerotic heart disease of native coronary artery without angina pectoris: Secondary | ICD-10-CM | POA: Diagnosis present

## 2022-08-25 DIAGNOSIS — J45909 Unspecified asthma, uncomplicated: Secondary | ICD-10-CM | POA: Diagnosis present

## 2022-08-25 DIAGNOSIS — Z9049 Acquired absence of other specified parts of digestive tract: Secondary | ICD-10-CM

## 2022-08-25 DIAGNOSIS — Z95828 Presence of other vascular implants and grafts: Secondary | ICD-10-CM

## 2022-08-25 DIAGNOSIS — Z9981 Dependence on supplemental oxygen: Secondary | ICD-10-CM

## 2022-08-25 DIAGNOSIS — Z7951 Long term (current) use of inhaled steroids: Secondary | ICD-10-CM

## 2022-08-25 DIAGNOSIS — D62 Acute posthemorrhagic anemia: Secondary | ICD-10-CM | POA: Diagnosis present

## 2022-08-25 DIAGNOSIS — K5520 Angiodysplasia of colon without hemorrhage: Secondary | ICD-10-CM | POA: Diagnosis present

## 2022-08-25 DIAGNOSIS — Z681 Body mass index (BMI) 19 or less, adult: Secondary | ICD-10-CM

## 2022-08-25 DIAGNOSIS — Z888 Allergy status to other drugs, medicaments and biological substances status: Secondary | ICD-10-CM

## 2022-08-25 DIAGNOSIS — Z8711 Personal history of peptic ulcer disease: Secondary | ICD-10-CM | POA: Diagnosis not present

## 2022-08-25 DIAGNOSIS — D649 Anemia, unspecified: Secondary | ICD-10-CM | POA: Diagnosis not present

## 2022-08-25 DIAGNOSIS — I10 Essential (primary) hypertension: Secondary | ICD-10-CM | POA: Diagnosis present

## 2022-08-25 DIAGNOSIS — K219 Gastro-esophageal reflux disease without esophagitis: Secondary | ICD-10-CM | POA: Diagnosis present

## 2022-08-25 DIAGNOSIS — R630 Anorexia: Secondary | ICD-10-CM

## 2022-08-25 DIAGNOSIS — G8929 Other chronic pain: Secondary | ICD-10-CM | POA: Diagnosis present

## 2022-08-25 DIAGNOSIS — Z8042 Family history of malignant neoplasm of prostate: Secondary | ICD-10-CM | POA: Diagnosis not present

## 2022-08-25 DIAGNOSIS — J9621 Acute and chronic respiratory failure with hypoxia: Principal | ICD-10-CM | POA: Diagnosis present

## 2022-08-25 DIAGNOSIS — D509 Iron deficiency anemia, unspecified: Secondary | ICD-10-CM

## 2022-08-25 DIAGNOSIS — F32A Depression, unspecified: Secondary | ICD-10-CM | POA: Diagnosis present

## 2022-08-25 DIAGNOSIS — Z9221 Personal history of antineoplastic chemotherapy: Secondary | ICD-10-CM

## 2022-08-25 DIAGNOSIS — J9611 Chronic respiratory failure with hypoxia: Secondary | ICD-10-CM | POA: Diagnosis present

## 2022-08-25 DIAGNOSIS — J90 Pleural effusion, not elsewhere classified: Secondary | ICD-10-CM | POA: Diagnosis present

## 2022-08-25 DIAGNOSIS — R918 Other nonspecific abnormal finding of lung field: Secondary | ICD-10-CM | POA: Diagnosis not present

## 2022-08-25 DIAGNOSIS — J9601 Acute respiratory failure with hypoxia: Secondary | ICD-10-CM

## 2022-08-25 DIAGNOSIS — R64 Cachexia: Secondary | ICD-10-CM | POA: Diagnosis present

## 2022-08-25 DIAGNOSIS — Z801 Family history of malignant neoplasm of trachea, bronchus and lung: Secondary | ICD-10-CM

## 2022-08-25 DIAGNOSIS — Z923 Personal history of irradiation: Secondary | ICD-10-CM | POA: Diagnosis not present

## 2022-08-25 DIAGNOSIS — Z8041 Family history of malignant neoplasm of ovary: Secondary | ICD-10-CM | POA: Diagnosis not present

## 2022-08-25 DIAGNOSIS — C3491 Malignant neoplasm of unspecified part of right bronchus or lung: Secondary | ICD-10-CM

## 2022-08-25 DIAGNOSIS — C3490 Malignant neoplasm of unspecified part of unspecified bronchus or lung: Secondary | ICD-10-CM

## 2022-08-25 DIAGNOSIS — K259 Gastric ulcer, unspecified as acute or chronic, without hemorrhage or perforation: Secondary | ICD-10-CM | POA: Diagnosis present

## 2022-08-25 DIAGNOSIS — Z808 Family history of malignant neoplasm of other organs or systems: Secondary | ICD-10-CM

## 2022-08-25 DIAGNOSIS — Z79899 Other long term (current) drug therapy: Secondary | ICD-10-CM

## 2022-08-25 LAB — CBC WITH DIFFERENTIAL/PLATELET
Abs Immature Granulocytes: 0.02 10*3/uL (ref 0.00–0.07)
Basophils Absolute: 0 10*3/uL (ref 0.0–0.1)
Basophils Relative: 0 %
Eosinophils Absolute: 0 10*3/uL (ref 0.0–0.5)
Eosinophils Relative: 0 %
HCT: 24.6 % — ABNORMAL LOW (ref 36.0–46.0)
Hemoglobin: 7.4 g/dL — ABNORMAL LOW (ref 12.0–15.0)
Immature Granulocytes: 0 %
Lymphocytes Relative: 8 %
Lymphs Abs: 0.4 10*3/uL — ABNORMAL LOW (ref 0.7–4.0)
MCH: 20.5 pg — ABNORMAL LOW (ref 26.0–34.0)
MCHC: 30.1 g/dL (ref 30.0–36.0)
MCV: 68.1 fL — ABNORMAL LOW (ref 80.0–100.0)
Monocytes Absolute: 0.1 10*3/uL (ref 0.1–1.0)
Monocytes Relative: 1 %
Neutro Abs: 4.2 10*3/uL (ref 1.7–7.7)
Neutrophils Relative %: 91 %
Platelets: 326 10*3/uL (ref 150–400)
RBC: 3.61 MIL/uL — ABNORMAL LOW (ref 3.87–5.11)
RDW: 21.3 % — ABNORMAL HIGH (ref 11.5–15.5)
WBC: 4.7 10*3/uL (ref 4.0–10.5)
nRBC: 0 % (ref 0.0–0.2)

## 2022-08-25 LAB — TYPE AND SCREEN: Antibody Screen: NEGATIVE

## 2022-08-25 LAB — BASIC METABOLIC PANEL
Anion gap: 8 (ref 5–15)
BUN: 19 mg/dL (ref 8–23)
CO2: 28 mmol/L (ref 22–32)
Calcium: 8.5 mg/dL — ABNORMAL LOW (ref 8.9–10.3)
Chloride: 104 mmol/L (ref 98–111)
Creatinine, Ser: 0.6 mg/dL (ref 0.44–1.00)
GFR, Estimated: >60 mL/min (ref >60)
Glucose, Bld: 89 mg/dL (ref 70–99)
Potassium: 3.6 mmol/L (ref 3.5–5.1)
Sodium: 140 mmol/L (ref 135–145)

## 2022-08-25 LAB — PREPARE RBC (CROSSMATCH)

## 2022-08-25 LAB — POC OCCULT BLOOD, ED: Fecal Occult Bld: NEGATIVE

## 2022-08-25 LAB — BRAIN NATRIURETIC PEPTIDE: B Natriuretic Peptide: 751 pg/mL — ABNORMAL HIGH (ref 0.0–100.0)

## 2022-08-25 MED ORDER — ALBUTEROL SULFATE (2.5 MG/3ML) 0.083% IN NEBU
2.5000 mg | INHALATION_SOLUTION | Freq: Once | RESPIRATORY_TRACT | Status: AC
  Administered 2022-08-25: 2.5 mg via RESPIRATORY_TRACT
  Filled 2022-08-25: qty 3

## 2022-08-25 MED ORDER — HYDROCODONE-ACETAMINOPHEN 5-325 MG PO TABS
1.0000 | ORAL_TABLET | ORAL | Status: DC | PRN
  Administered 2022-08-26: 2 via ORAL
  Administered 2022-08-27: 1 via ORAL
  Filled 2022-08-25: qty 1
  Filled 2022-08-25: qty 2

## 2022-08-25 MED ORDER — DEXTROMETHORPHAN POLISTIREX ER 30 MG/5ML PO SUER
30.0000 mg | Freq: Two times a day (BID) | ORAL | Status: DC | PRN
  Administered 2022-08-26: 30 mg via ORAL
  Filled 2022-08-25: qty 5

## 2022-08-25 MED ORDER — LIDOCAINE HCL (PF) 2 % IJ SOLN
10.0000 mL | Freq: Once | INTRAMUSCULAR | Status: AC
  Administered 2022-08-25: 10 mL

## 2022-08-25 MED ORDER — TAMSULOSIN HCL 0.4 MG PO CAPS
0.4000 mg | ORAL_CAPSULE | Freq: Every day | ORAL | Status: DC
  Administered 2022-08-25 – 2022-08-27 (×3): 0.4 mg via ORAL
  Filled 2022-08-25 (×3): qty 1

## 2022-08-25 MED ORDER — CHLORHEXIDINE GLUCONATE CLOTH 2 % EX PADS
6.0000 | MEDICATED_PAD | Freq: Every day | CUTANEOUS | Status: DC
  Administered 2022-08-26 – 2022-08-28 (×3): 6 via TOPICAL

## 2022-08-25 MED ORDER — ONDANSETRON HCL 4 MG/2ML IJ SOLN: 4.0000 mg | Freq: Four times a day (QID) | INTRAMUSCULAR | Status: DC | PRN

## 2022-08-25 MED ORDER — SODIUM CHLORIDE 0.9% IV SOLUTION: Freq: Once | INTRAVENOUS | Status: AC

## 2022-08-25 MED ORDER — METHYLPREDNISOLONE SODIUM SUCC 40 MG IJ SOLR
40.0000 mg | Freq: Two times a day (BID) | INTRAMUSCULAR | Status: DC
  Administered 2022-08-26 – 2022-08-28 (×5): 40 mg via INTRAVENOUS
  Filled 2022-08-25 (×5): qty 1

## 2022-08-25 MED ORDER — INTEGRA PLUS PO CAPS: 1.0000 | ORAL_CAPSULE | Freq: Every morning | ORAL | Status: DC

## 2022-08-25 MED ORDER — ENSURE ENLIVE PO LIQD
237.0000 mL | Freq: Three times a day (TID) | ORAL | Status: DC
  Administered 2022-08-25 – 2022-08-28 (×9): 237 mL via ORAL
  Filled 2022-08-25 (×12): qty 237

## 2022-08-25 MED ORDER — ONDANSETRON HCL 4 MG PO TABS: 4.0000 mg | ORAL_TABLET | Freq: Four times a day (QID) | ORAL | Status: DC | PRN

## 2022-08-25 MED ORDER — MEGESTROL ACETATE 400 MG/10ML PO SUSP
625.0000 mg | Freq: Every day | ORAL | Status: DC
  Administered 2022-08-26 – 2022-08-28 (×3): 625 mg via ORAL
  Filled 2022-08-25 (×7): qty 20

## 2022-08-25 MED ORDER — TRAZODONE HCL 50 MG PO TABS
50.0000 mg | ORAL_TABLET | Freq: Every day | ORAL | Status: DC
  Administered 2022-08-25 – 2022-08-27 (×3): 50 mg via ORAL
  Filled 2022-08-25 (×3): qty 1

## 2022-08-25 MED ORDER — FOLIC ACID 1 MG PO TABS
1.0000 mg | ORAL_TABLET | Freq: Every day | ORAL | Status: DC
  Administered 2022-08-26 – 2022-08-28 (×3): 1 mg via ORAL
  Filled 2022-08-25 (×3): qty 1

## 2022-08-25 MED ORDER — IOHEXOL 350 MG/ML SOLN
75.0000 mL | Freq: Once | INTRAVENOUS | Status: AC | PRN
  Administered 2022-08-25: 75 mL via INTRAVENOUS

## 2022-08-25 MED ORDER — TOPIRAMATE 25 MG PO TABS
25.0000 mg | ORAL_TABLET | Freq: Two times a day (BID) | ORAL | Status: DC | PRN
  Administered 2022-08-26: 25 mg via ORAL
  Filled 2022-08-25: qty 1

## 2022-08-25 MED ORDER — FUROSEMIDE 10 MG/ML IJ SOLN
20.0000 mg | Freq: Once | INTRAMUSCULAR | Status: AC
  Administered 2022-08-25: 20 mg via INTRAVENOUS
  Filled 2022-08-25: qty 2

## 2022-08-25 MED ORDER — DULOXETINE HCL 60 MG PO CPEP
60.0000 mg | ORAL_CAPSULE | Freq: Two times a day (BID) | ORAL | Status: DC
  Administered 2022-08-25 – 2022-08-28 (×6): 60 mg via ORAL
  Filled 2022-08-25 (×6): qty 1

## 2022-08-25 MED ORDER — ACETAMINOPHEN 650 MG RE SUPP: 650.0000 mg | Freq: Four times a day (QID) | RECTAL | Status: DC | PRN

## 2022-08-25 MED ORDER — PANTOPRAZOLE SODIUM 40 MG PO TBEC
40.0000 mg | DELAYED_RELEASE_TABLET | Freq: Two times a day (BID) | ORAL | Status: DC
  Administered 2022-08-25 – 2022-08-28 (×7): 40 mg via ORAL
  Filled 2022-08-25 (×7): qty 1

## 2022-08-25 MED ORDER — PROCHLORPERAZINE EDISYLATE 10 MG/2ML IJ SOLN: 10.0000 mg | INTRAMUSCULAR | Status: DC | PRN

## 2022-08-25 MED ORDER — FLUTICASONE FUROATE-VILANTEROL 200-25 MCG/ACT IN AEPB
1.0000 | INHALATION_SPRAY | Freq: Every day | RESPIRATORY_TRACT | Status: DC
  Administered 2022-08-26 – 2022-08-28 (×3): 1 via RESPIRATORY_TRACT
  Filled 2022-08-25: qty 28

## 2022-08-25 MED ORDER — ALBUTEROL SULFATE (2.5 MG/3ML) 0.083% IN NEBU
2.5000 mg | INHALATION_SOLUTION | Freq: Three times a day (TID) | RESPIRATORY_TRACT | Status: DC | PRN
  Administered 2022-08-26 (×2): 2.5 mg via RESPIRATORY_TRACT
  Filled 2022-08-25 (×2): qty 3

## 2022-08-25 MED ORDER — ACETAMINOPHEN 325 MG PO TABS
650.0000 mg | ORAL_TABLET | Freq: Four times a day (QID) | ORAL | Status: DC | PRN
  Administered 2022-08-26 – 2022-08-27 (×2): 650 mg via ORAL
  Filled 2022-08-25 (×2): qty 2

## 2022-08-25 MED ORDER — B COMPLEX-C PO TABS
1.0000 | ORAL_TABLET | Freq: Every day | ORAL | Status: DC
  Administered 2022-08-26 – 2022-08-28 (×3): 1 via ORAL
  Filled 2022-08-25 (×3): qty 1

## 2022-08-25 MED ORDER — METHYLPREDNISOLONE SODIUM SUCC 125 MG IJ SOLR
125.0000 mg | Freq: Once | INTRAMUSCULAR | Status: AC
  Administered 2022-08-25: 125 mg via INTRAVENOUS
  Filled 2022-08-25: qty 2

## 2022-08-25 MED ORDER — IPRATROPIUM-ALBUTEROL 0.5-2.5 (3) MG/3ML IN SOLN
3.0000 mL | Freq: Once | RESPIRATORY_TRACT | Status: AC
  Administered 2022-08-25: 3 mL via RESPIRATORY_TRACT
  Filled 2022-08-25: qty 3

## 2022-08-25 MED ORDER — FENTANYL CITRATE PF 50 MCG/ML IJ SOSY: 12.5000 ug | PREFILLED_SYRINGE | INTRAMUSCULAR | Status: DC | PRN

## 2022-08-25 MED ORDER — ENOXAPARIN SODIUM 40 MG/0.4ML IJ SOSY
40.0000 mg | PREFILLED_SYRINGE | INTRAMUSCULAR | Status: DC
  Administered 2022-08-26 – 2022-08-28 (×3): 40 mg via SUBCUTANEOUS
  Filled 2022-08-25 (×3): qty 0.4

## 2022-08-25 MED ORDER — LIDOCAINE HCL (PF) 2 % IJ SOLN
INTRAMUSCULAR | Status: AC
  Filled 2022-08-25: qty 10

## 2022-08-25 NOTE — ED Notes (Signed)
Pt's daughter [name] updated per pt approval.

## 2022-08-25 NOTE — H&P (Signed)
History and Physical  [facility]  [place] UJW:[id number] DOB: 1950-11-13 DOA: 08/25/2022  PCP: Oneal Grout, FNP  Patient coming from: Home by [facility]  Level of care: Telemetry  I have personally briefly reviewed patient's old medical records in [facility]  Chief Complaint: SOB  HPI: Courtney Zuniga is a 72 year old female with history of recurrent non-small cell lung cancer, chronic shortness of breath, chronic hypoxic respiratory failure, oxygen dependent on 4 L/min, iron deficiency anemia, coronary artery disease with history of NSTEMI, history of gastric ulcers, history of duodenal AVMs, history of GI bleeding, has been followed and treated by Dr. Shirline Frees with oncology and currently under surveillance and chemotherapy has been on hold due to problems with hypotension and anemia.  Patient presented today with vague complaints of shortness of breath for 2 months.  Apparently her shortness of breath has been worse with exertion.  Patient denies having chest pain fever and chills nausea vomiting and diarrhea.  She occasionally has cough but nonproductive.  She was sent for CT of her chest today with findings of moderately sized right pleural effusion.  She has an unchanged right upper lobe spiculated mass measuring 1.8 x 1.3 cm and bilateral lower lobe lung opacities not changed from prior examination and representing metastatic disease.  She was sent for thoracentesis.  Her shortness of breath did not significantly improve after the thoracentesis.  Her exam suggested wheezing and Rales consistent with a COPD exacerbation and admission was requested for further management of this as a cause to her acutely worsening shortness of breath.  She was noted to have a 2 g drop in hemoglobin from 9.2-7.4 in the last 1 month.  She was ordered 1 unit PRBC in the ED for symptomatic anemia.  Her stool was guaiac negative today.    Past Medical History:  Diagnosis Date   Anemia    as a  young woman   Arthritis    osteoartritis   Asthma    Brain tumor (benign) (HCC) 2005 [facility]   Benign   Chronic headaches    Chronic hip pain    Chronic pain    COPD (chronic obstructive pulmonary disease) (HCC)    Coronary artery disease    Depression    Depression 05/15/2016   Encounter for antineoplastic chemotherapy 01/10/2016   GERD (gastroesophageal reflux disease)    Hypertension    Lung cancer (HCC) dx'd 01/2016   currently on chemo and radiation    NSTEMI (non-ST elevated myocardial infarction) (HCC) yrs ago   On home O2    qhs 2 liters at hs and prn   Pneumonia last time 2 yrs ago   Shortness of breath dyspnea    with activity    Past Surgical History:  Procedure Laterality Date   CHOLECYSTECTOMY     COLONOSCOPY  2015   Results requested from [facility]   COLONOSCOPY     ESOPHAGOGASTRODUODENOSCOPY N/A 08/14/2015   Procedure: ESOPHAGOGASTRODUODENOSCOPY (EGD);  Surgeon: Corbin Ade, MD;  [facility];  Service: Endoscopy;  Laterality: N/A;  215    ESOPHAGOGASTRODUODENOSCOPY (EGD) WITH PROPOFOL N/A 09/13/2015   Procedure: ESOPHAGOGASTRODUODENOSCOPY (EGD) WITH PROPOFOL;  Surgeon: Rachael Fee, MD;  Location: [facility];  Service: Endoscopy;  Laterality: N/A;   ESOPHAGOGASTRODUODENOSCOPY (EGD) WITH PROPOFOL N/A 05/19/2022   Procedure: ESOPHAGOGASTRODUODENOSCOPY (EGD) WITH PROPOFOL;  Surgeon: Lanelle Bal, DO;  [facility]: [facility];  Service: Endoscopy;  Laterality: N/A;   EUS N/A 03/12/2017  Procedure: UPPER ENDOSCOPIC ULTRASOUND (EUS) RADIAL;  Surgeon: Rachael Fee, MD;  Location: [facility];  Service: Endoscopy;  Laterality: N/A;   HOT HEMOSTASIS  05/19/2022   Procedure: HOT HEMOSTASIS (ARGON PLASMA COAGULATION/BICAP);  Surgeon: Lanelle Bal, DO;  Location: [facility];  Service: Endoscopy;;   IR IMAGING GUIDED PORT INSERTION  01/20/2022   TUMOR REMOVAL  2005   Benign   UPPER ESOPHAGEAL ENDOSCOPIC ULTRASOUND (EUS)  09/13/2015    Procedure: UPPER ESOPHAGEAL ENDOSCOPIC ULTRASOUND (EUS);  Surgeon: Rachael Fee, MD;  Location: [facility];  Service: Endoscopy;;   VIDEO BRONCHOSCOPY WITH ENDOBRONCHIAL NAVIGATION N/A 12/31/2015   Procedure: VIDEO BRONCHOSCOPY WITH ENDOBRONCHIAL NAVIGATION;  Surgeon: Loreli Slot, MD;  Location: [facility] OR;  Service: Thoracic;  Laterality: N/A;   VIDEO BRONCHOSCOPY WITH ENDOBRONCHIAL ULTRASOUND N/A 11/08/2015   Procedure: VIDEO BRONCHOSCOPY WITH ENDOBRONCHIAL ULTRASOUND;  Surgeon: Kerin Perna, MD;  Location: [facility] OR;  Service: Thoracic;  Laterality: N/A;     reports that she quit smoking about 4 years ago. Her smoking use included cigarettes. She has a 38.00 pack-year smoking history. She has never used smokeless tobacco. She reports that she does not drink alcohol and does not use drugs.  Allergies  Allergen Reactions   Desyrel [Trazodone]     "Made me faint"    Family History  Problem Relation Age of Onset   Ovarian cancer Mother    Lung cancer Father    Brain cancer Sister        Half sister   Lung cancer Brother    Lung cancer Brother    Prostate cancer Brother    Colon cancer Neg Hx    Colon polyps Neg Hx     Prior to Admission medications   Medication Sig Start Date End Date Taking? Authorizing Provider  acetaminophen (TYLENOL) 325 MG tablet Take 2 tablets (650 mg total) by mouth every 6 (six) hours as needed for mild pain, fever or headache. 05/08/18  Yes Emokpae, Courage, MD  albuterol (PROVENTIL) (2.5 MG/3ML) 0.083% nebulizer solution Take 2.5 mg by nebulization 3 (three) times daily as needed for shortness of breath or wheezing. 08/18/22  Yes [provider]  albuterol (VENTOLIN HFA) 108 (90 Base) MCG/ACT inhaler Inhale 2 puffs into the lungs every 4 (four) hours as needed for shortness of breath. 04/26/22  Yes Emokpae, Courage, MD  DULoxetine (CYMBALTA) 60 MG capsule Take 1 capsule (60 mg total) by mouth 2 (two) times daily. 04/26/22  Yes Emokpae, Courage,  MD  FeFum-FePoly-FA-B Cmp-C-Biot (INTEGRA PLUS) CAPS Take 1 capsule by mouth every morning. 06/12/22  Yes Heilingoetter, Cassandra L, PA-C  fluticasone furoate-vilanterol (BREO ELLIPTA) 200-25 MCG/ACT AEPB Inhale 1 puff into the lungs daily. 04/26/22  Yes Emokpae, Courage, MD  Ipratropium-Albuterol (COMBIVENT RESPIMAT) 20-100 MCG/ACT AERS respimat Inhale 1 puff into the lungs every 6 (six) hours as needed for wheezing or shortness of breath. 04/26/22  Yes Emokpae, Courage, MD  megestrol (MEGACE ES) 625 MG/5ML suspension Take 5 mLs (625 mg total) by mouth daily. 01/29/22  Yes Heilingoetter, Cassandra L, PA-C  oxyCODONE-acetaminophen (PERCOCET/ROXICET) 5-325 MG tablet Take 1 tablet by mouth every 8 (eight) hours as needed for moderate pain. 05/08/18  Yes Emokpae, Courage, MD  pantoprazole (PROTONIX) 40 MG tablet Take 1 tablet (40 mg total) by mouth 2 (two) times daily. 07/16/22 07/16/23 Yes Carver, Hennie Duos, DO  prochlorperazine (COMPAZINE) 10 MG tablet Take 1 tablet (10 mg total) by mouth every 6 (six) hours as needed for nausea  or vomiting. 04/26/22  Yes Emokpae, Courage, MD  tamsulosin (FLOMAX) 0.4 MG CAPS capsule Take 1 capsule (0.4 mg total) by mouth daily after supper. 04/26/22  Yes Emokpae, Courage, MD  topiramate (TOPAMAX) 25 MG tablet Take 1 tablet (25 mg total) by mouth 2 (two) times daily. Patient taking differently: Take 25 mg by mouth 2 (two) times daily as needed (headache). 04/26/22  Yes Emokpae, Courage, MD  traZODone (DESYREL) 50 MG tablet Take 50 mg by mouth at bedtime. 08/05/22  Yes [provider]  TRELEGY ELLIPTA 100-62.5-25 MCG/ACT AEPB Inhale 1 puff into the lungs daily. 04/26/22  Yes Emokpae, Courage, MD  Vitamin D, Ergocalciferol, (DRISDOL) 1.25 MG (50000 UNIT) CAPS capsule Take 1 capsule (50,000 Units total) by mouth once a week. 04/26/22  Yes Emokpae, Courage, MD  ENSURE (ENSURE) Take 237 mLs by mouth 3 (three) times daily between meals.    [provider]  HYDROcodone  bit-homatropine (HYCODAN) 5-1.5 MG/5ML syrup Take 5 mLs by mouth every 6 (six) hours as needed for cough. 11/10/21   Si Gaul, MD    Physical Exam: Vitals:   08/25/22 1346 08/25/22 1350 08/25/22 1500 08/25/22 1639  BP: 123/69 118/66 124/80 113/73  Pulse: 83 85 88 85  Resp: (!) 22 18 19  (!) 28  Temp:    98.4 F (36.9 C)  TempSrc:    Oral  SpO2: 90% 98% 98% 97%  Weight:    57.3 kg  Height:    5\' 6"  (1.676 m)    Constitutional: Cachectic, chronically ill-appearing, awake NAD, calm Eyes: PERRL, lids and conjunctivae normal ENMT: Mucous membranes are moist. Posterior pharynx clear of any exudate or lesions.Normal dentition.  Neck: normal, supple, no masses, no thyromegaly Respiratory: Poor air movement, diffuse wheezing.  Cardiovascular: normal s1, s2 sounds, no murmurs / rubs / gallops. No extremity edema. 2+ pedal pulses. No carotid bruits.  Abdomen: no tenderness, no masses palpated. No hepatosplenomegaly. Bowel sounds positive.  Musculoskeletal: no clubbing / cyanosis. No joint deformity upper and lower extremities. Good ROM, no contractures. Normal muscle tone.  Skin: no rashes, lesions, ulcers. No induration Neurologic: CN 2-12 grossly intact. Sensation intact, DTR normal. Strength 5/5 in all 4.  Psychiatric: Normal judgment and insight. Alert and oriented x 3. Normal mood.   Labs on Admission: I have personally reviewed following labs and imaging studies  CBC: Recent Labs  Lab 08/25/22 1227  WBC 4.7  NEUTROABS 4.2  HGB 7.4*  HCT 24.6*  MCV 68.1*  PLT 326   Basic Metabolic Panel: Recent Labs  Lab 08/25/22 0923  NA 140  K 3.6  CL 104  CO2 28  GLUCOSE 89  BUN 19  CREATININE 0.60  CALCIUM 8.5*   GFR: Estimated Creatinine Clearance: 56.7 mL/min (by C-G formula based on SCr of 0.6 mg/dL). Liver Function Tests: No results for input(s): "AST", "ALT", "ALKPHOS", "BILITOT", "PROT", "ALBUMIN" in the last 168 hours. No results for input(s): "LIPASE", "AMYLASE"  in the last 168 hours. No results for input(s): "AMMONIA" in the last 168 hours. Coagulation Profile: No results for input(s): "INR", "PROTIME" in the last 168 hours. Cardiac Enzymes: No results for input(s): "CKTOTAL", "CKMB", "CKMBINDEX", "TROPONINI" in the last 168 hours. BNP (last 3 results) No results for input(s): "PROBNP" in the last 8760 hours. HbA1C: No results for input(s): "HGBA1C" in the last 72 hours. CBG: No results for input(s): "GLUCAP" in the last 168 hours. Lipid Profile: No results for input(s): "CHOL", "HDL", "LDLCALC", "TRIG", "CHOLHDL", "LDLDIRECT" in the last  72 hours. Thyroid Function Tests: No results for input(s): "TSH", "T4TOTAL", "FREET4", "T3FREE", "THYROIDAB" in the last 72 hours. Anemia Panel: No results for input(s): "VITAMINB12", "FOLATE", "FERRITIN", "TIBC", "IRON", "RETICCTPCT" in the last 72 hours. Urine analysis:    Component Value Date/Time   COLORURINE YELLOW 07/16/2022 1928   APPEARANCEUR CLEAR 07/16/2022 1928   LABSPEC 1.014 07/16/2022 1928   PHURINE 5.0 07/16/2022 1928   GLUCOSEU NEGATIVE 07/16/2022 1928   HGBUR NEGATIVE 07/16/2022 1928   BILIRUBINUR NEGATIVE 07/16/2022 1928   KETONESUR NEGATIVE 07/16/2022 1928   PROTEINUR NEGATIVE 07/16/2022 1928   UROBILINOGEN 0.2 03/31/2014 1406   NITRITE NEGATIVE 07/16/2022 1928   LEUKOCYTESUR NEGATIVE 07/16/2022 1928    Radiological Exams on Admission: US THORACENTESIS ASP PLEURAL SPACE W/IMG GUIDE  Result Date: 08/25/2022 INDICATION: Patient with a history of lung cancer presents today with a right pleural effusion. Interventional Radiology asked to perform a diagnostic and therapeutic thoracentesis. EXAM: ULTRASOUND GUIDED THORACENTESIS MEDICATIONS: 1% lidocaine 10 ml COMPLICATIONS: None immediate. PROCEDURE: An ultrasound guided thoracentesis was thoroughly discussed with the patient and questions answered. The benefits, risks, alternatives and complications were also discussed. The patient  understands and wishes to proceed with the procedure. Written consent was obtained. Ultrasound was performed to localize and mark an adequate pocket of fluid in the right chest. The area was then prepped and draped in the normal sterile fashion. 1% Lidocaine was used for local anesthesia. Under ultrasound guidance a 6 Fr Safe-T-Centesis catheter was introduced. Thoracentesis was performed. The catheter was removed and a dressing applied. FINDINGS: A total of approximately 350 ml of amber-colored fluid was removed. Samples were sent to the laboratory as requested by the clinical team. IMPRESSION: Successful ultrasound guided right thoracentesis yielding 350 ml of pleural fluid. Procedure performed by: Alwyn Ren, NP Electronically Signed   By: Olive Bass M.D.   On: 08/25/2022 15:07   DG Chest 1 View  Result Date: 08/25/2022 CLINICAL DATA:  Post thoracentesis. EXAM: CHEST  1 VIEW COMPARISON:  08/25/2022 at 9:28 a.m. FINDINGS: Right-sided Port-A-Cath unchanged. Lungs are adequately inflated demonstrate improvement in patients right-sided pleural effusion post thoracentesis. Small amount of residual right pleural fluid with atelectasis is present in the right base. No definite pneumothorax. Remainder of the exam is unchanged. IMPRESSION: Improvement in patients right-sided pleural effusion post thoracentesis. Small amount of residual right pleural fluid with atelectasis in the right base. No pneumothorax. Electronically Signed   By: Elberta Fortis M.D.   On: 08/25/2022 14:11   CT Angio Chest PE W and/or Wo Contrast  Result Date: 08/25/2022 CLINICAL DATA:  Increased shortness of breath for over 2 months. Patient wears 4 L of continuous oxygen cannula at home due to lung cancer. EXAM: CT ANGIOGRAPHY CHEST WITH CONTRAST TECHNIQUE: Multidetector CT imaging of the chest was performed using the standard protocol during bolus administration of intravenous contrast. Multiplanar CT image reconstructions and MIPs  were obtained to evaluate the vascular anatomy. RADIATION DOSE REDUCTION: This exam was performed according to the departmental dose-optimization program which includes automated exposure control, adjustment of the mA and/or kV according to patient size and/or use of iterative reconstruction technique. CONTRAST:  75mL OMNIPAQUE IOHEXOL 350 MG/ML SOLN COMPARISON:  CT examination dated June 25, 2018 FINDINGS: Cardiovascular: Satisfactory opacification of the pulmonary arteries to the segmental level. No evidence of pulmonary embolism. Normal heart size. No pericardial effusion. Main pulmonary trunk is dilated measuring up to 3.5 cm concerning for pulmonary arterial hypertension. Right IJ access MediPort with distal tip in  the SVC. Mediastinum/Nodes: No enlarged mediastinal, hilar, or axillary lymph nodes. Thyroid gland, trachea, and esophagus demonstrate no significant findings. Lungs/Pleura: Emphysematous changes with biapical pleural/parenchymal scarring. Right upper lobe spiculated mass measuring a proximally 1.8 x 1.3 cm, not significantly changed. Interval development of moderate-sized right pleural effusion. Bilateral lower lobe lung opacities, the left lower lobe opacity measuring 13 x 11 mm and partially imaged right lower lobe opacity measuring 8 x 17 mm. These are not significantly changed from prior examination and likely represent metastatic disease. Upper Abdomen: Cholecystectomy changes.  No acute abnormality. Musculoskeletal: No chest wall abnormality. No acute or significant osseous findings. T12 vertebral body hemangioma. Review of the MIP images confirms the above findings. IMPRESSION: 1. No evidence of pulmonary embolism. 2. Interval development of moderate-sized right pleural effusion. 3. Right upper lobe spiculated mass measuring 1.8 x 1.3 cm, not significantly changed from prior examination. 4. Bilateral lower lobe lung opacities, not significantly changed from prior examination and likely  represent metastatic disease. 5. Dilated main pulmonary trunk measuring up to 3.5 cm concerning for pulmonary arterial hypertension. Aortic Atherosclerosis (ICD10-I70.0) and Emphysema (ICD10-J43.9). Electronically Signed   By: Larose Hires D.O.   On: 08/25/2022 12:03   DG Chest Port 1 View  Result Date: 08/25/2022 CLINICAL DATA:  Shortness of breath. EXAM: PORTABLE CHEST 1 VIEW COMPARISON:  CT chest dated June 25, 2022 FINDINGS: The heart size and mediastinal contours are within normal limits. Hyperinflated lungs with emphysematous changes of the bilateral upper lobes. Bibasilar scarring/atelectasis, not significantly changed. Right IJ access MediPort with distal tip in the SVC. No acute osseous abnormality. IMPRESSION: 1. Hyperinflated lungs with emphysematous changes of the bilateral upper lobes, unchanged. 2. Bibasilar scarring/atelectasis, not significantly changed. No evidence of acute pneumonia or large pleural effusion. Electronically Signed   By: Larose Hires D.O.   On: 08/25/2022 09:49    EKG: Independently reviewed.   Assessment/Plan Principal Problem:   Acute on chronic respiratory failure with hypoxia (HCC) Active Problems:   Acute blood loss anemia   HTN (hypertension)   Asthma, chronic   COPD with acute exacerbation (HCC)   Lung mass   Severe protein-calorie malnutrition (HCC)   GERD (gastroesophageal reflux disease)   Port-A-Cath in place   Acute on chronic anemia   Gastric ulcer   AVM (arteriovenous malformation) of small bowel, acquired   Iron deficiency anemia   Pleural effusion   Acute on chronic respiratory failure with hypoxia Secondary to COPD exacerbation Secondary to moderate-sized right pleural effusion Second to advancing stage IV recurrent non-small cell lung cancer Secondary to acute on chronic anemia with hemoglobin loss of 2 g in 1 month --Patient is being transfused 1 unit PRBC for symptomatic anemia --Treating COPD exacerbation with IV steroids and  bronchodilators -- Continue supplemental oxygen as ordered with goal pulse ox greater than 88%  Acute on chronic anemia -- Patient has a long history of GI bleeding with AVM and gastric ulcer -- Fortunately patient is guaiac negative today -- Transfuse 1 unit PRBC -- Pantoprazole ordered for GI protection -- Repeat CBC in a.m.  Recurrent non-small cell lung cancer stage IV -- Patient is currently in surveillance by Dr. Shirline Frees and not currently on chemotherapy -- Patient to follow-up with Dr. Arbutus Ped  History of AVM and gastric ulcers GERD -- Pantoprazole ordered for GI protection  Severe protein calorie malnutrition -- Regular diet and supplementation ordered for additional calories  Essential hypertension -- Blood pressure well-controlled at this time will follow  DVT  prophylaxis: SCDs  Code Status: Full  Family Communication:   Disposition Plan: anticipate home when medically stabilized   Consults called:   Admission status: INP  Level of care: Telemetry Standley Dakins MD [facility] How to contact the [facility] Attending or Consulting provider 7A - 7P or covering provider during after hours 7P -7A, for this patient?  Check the care team in [facility] and look for a) attending/consulting [facility] provider listed and b) the [facility] team listed Log into [email or web address] and use [facility] universal password to access. If you do not have the password, please contact the hospital operator. Locate the [facility] provider you are looking for under [facility] and page to a number that you can be directly reached. If you still have difficulty reaching the provider, please page the [facility] (Director on Call) for the Hospitalists listed on amion for assistance.   If 7PM-7AM, please contact night-coverage [email or web address] Password [facility]  08/25/2022, 4:53 PM

## 2022-08-25 NOTE — ED Triage Notes (Signed)
Pt comes in with complaints of increased SOB for over 2 months. Pt called EMS out for SOB with exertion. Per EMS pt has bilateral lower rales. Pt wears continuous 4L of oxygen at home due to lung cancer.

## 2022-08-25 NOTE — ED Notes (Signed)
Patient transported to Ultrasound for thoracentesis 

## 2022-08-25 NOTE — ED Notes (Signed)
When pt is discharged call granddaughter Laquita to pick up pt. Number in chart.

## 2022-08-25 NOTE — Progress Notes (Signed)
   08/25/22 1639  Assess: MEWS Score  Temp 98.4 F (36.9 C)  BP 113/73  MAP (mmHg) 86  Pulse Rate 85  Resp (!) 28  SpO2 97 %  O2 Device Nasal Cannula  O2 Flow Rate (L/min) 4 L/min  Assess: MEWS Score  MEWS Temp 0  MEWS Systolic 0  MEWS Pulse 0  MEWS RR 2  MEWS LOC 0  MEWS Score 2  MEWS Score Color Yellow  Assess: if the MEWS score is Yellow or Red  Were vital signs taken at a resting state? Yes  Focused Assessment Change from prior assessment (see assessment flowsheet)  Does the patient meet 2 or more of the SIRS criteria? No  MEWS guidelines implemented  Yes, yellow  Treat  MEWS Interventions Considered administering scheduled or prn medications/treatments as ordered  Take Vital Signs  Increase Vital Sign Frequency  Yellow: Q2hr x1, continue Q4hrs until patient remains green for 12hrs  Escalate  MEWS: Escalate Yellow: Discuss with charge nurse and consider notifying provider and/or RRT  Notify: Charge Nurse/RN  Name of Charge Nurse/RN Notified [place] RN  Provider Notification  Provider Name/Title Cleora Fleet, MD  Date Provider Notified 08/25/22  Time Provider Notified 1640  Method of Notification Page (secure chat)  Notification Reason Other (Comment) (Yellow MEWs)  Provider response No new orders  Date of Provider Response 08/25/22  Time of Provider Response 1640  Assess: SIRS CRITERIA  SIRS Temperature  0  SIRS Pulse 0  SIRS Respirations  1  SIRS WBC 0  SIRS Score Sum  1

## 2022-08-25 NOTE — ED Provider Notes (Signed)
[place] EMERGENCY DEPARTMENT AT [facility] Provider Note   CSN: [id number] Arrival date & time: 08/25/22  4098     History  Chief Complaint  Patient presents with   Shortness of Breath    Courtney Zuniga is a 72 y.o. female.  Pt is a 72 yo female with a pmhx significant for recurrent non-small lung cancer; now stage IV.  She has COPD on 4L oxygen chronically, NSTEMI, iron deficiency anemia, benign brain tumor, benign gastric tumor, gastric ulcers, duodenal AVMs with GIB.  Pt was admitted for a GI bleed in February.  Her chemo has been on hold since then and she's been under surveillance with Dr. Arbutus Ped since then.  She has had problems with hypotension and anemia.  She last received a Venofer infusion in March.  Pt said she's been more sob for the past month.  She's had swelling in her feet and ankles.  O2 sat dropped to 83% with transfer from EMS stretcher to bed.  She is put on 5L and O2 sats are in the low 90s now.  Pt denies f/c.        Home Medications Prior to Admission medications   Medication Sig Start Date End Date Taking? Authorizing Provider  acetaminophen (TYLENOL) 325 MG tablet Take 2 tablets (650 mg total) by mouth every 6 (six) hours as needed for mild pain, fever or headache. 05/08/18  Yes Emokpae, Courage, MD  albuterol (PROVENTIL) (2.5 MG/3ML) 0.083% nebulizer solution Take 2.5 mg by nebulization 3 (three) times daily as needed for shortness of breath or wheezing. 08/18/22  Yes [provider]  albuterol (VENTOLIN HFA) 108 (90 Base) MCG/ACT inhaler Inhale 2 puffs into the lungs every 4 (four) hours as needed for shortness of breath. 04/26/22  Yes Emokpae, Courage, MD  DULoxetine (CYMBALTA) 60 MG capsule Take 1 capsule (60 mg total) by mouth 2 (two) times daily. 04/26/22  Yes Emokpae, Courage, MD  FeFum-FePoly-FA-B Cmp-C-Biot (INTEGRA PLUS) CAPS Take 1 capsule by mouth every morning. 06/12/22  Yes Heilingoetter, Cassandra L, PA-C  fluticasone  furoate-vilanterol (BREO ELLIPTA) 200-25 MCG/ACT AEPB Inhale 1 puff into the lungs daily. 04/26/22  Yes Emokpae, Courage, MD  Ipratropium-Albuterol (COMBIVENT RESPIMAT) 20-100 MCG/ACT AERS respimat Inhale 1 puff into the lungs every 6 (six) hours as needed for wheezing or shortness of breath. 04/26/22  Yes Emokpae, Courage, MD  megestrol (MEGACE ES) 625 MG/5ML suspension Take 5 mLs (625 mg total) by mouth daily. 01/29/22  Yes Heilingoetter, Cassandra L, PA-C  oxyCODONE-acetaminophen (PERCOCET/ROXICET) 5-325 MG tablet Take 1 tablet by mouth every 8 (eight) hours as needed for moderate pain. 05/08/18  Yes Emokpae, Courage, MD  pantoprazole (PROTONIX) 40 MG tablet Take 1 tablet (40 mg total) by mouth 2 (two) times daily. 07/16/22 07/16/23 Yes Carver, Hennie Duos, DO  prochlorperazine (COMPAZINE) 10 MG tablet Take 1 tablet (10 mg total) by mouth every 6 (six) hours as needed for nausea or vomiting. 04/26/22  Yes Emokpae, Courage, MD  tamsulosin (FLOMAX) 0.4 MG CAPS capsule Take 1 capsule (0.4 mg total) by mouth daily after supper. 04/26/22  Yes Emokpae, Courage, MD  topiramate (TOPAMAX) 25 MG tablet Take 1 tablet (25 mg total) by mouth 2 (two) times daily. Patient taking differently: Take 25 mg by mouth 2 (two) times daily as needed (headache). 04/26/22  Yes Emokpae, Courage, MD  traZODone (DESYREL) 50 MG tablet Take 50 mg by mouth at bedtime. 08/05/22  Yes [provider]  Dwyane Luo 100-62.5-25 MCG/ACT AEPB Inhale 1  puff into the lungs daily. 04/26/22  Yes Emokpae, Courage, MD  Vitamin D, Ergocalciferol, (DRISDOL) 1.25 MG (50000 UNIT) CAPS capsule Take 1 capsule (50,000 Units total) by mouth once a week. 04/26/22  Yes Emokpae, Courage, MD  ENSURE (ENSURE) Take 237 mLs by mouth 3 (three) times daily between meals.    [provider]  HYDROcodone bit-homatropine (HYCODAN) 5-1.5 MG/5ML syrup Take 5 mLs by mouth every 6 (six) hours as needed for cough. 11/10/21   Si Gaul, MD      Allergies     Desyrel [trazodone]    Review of Systems   Review of Systems  Respiratory:  Positive for shortness of breath.   All other systems reviewed and are negative.   Physical Exam Updated Vital Signs BP 118/66   Pulse 85   Temp 97.8 F (36.6 C) (Oral)   Resp 18   Ht 5\' 6"  (1.676 m)   Wt 54.3 kg   SpO2 98%   BMI 19.32 kg/m  Physical Exam Vitals and nursing note reviewed.  Constitutional:      Appearance: She is underweight.  HENT:     Head: Normocephalic and atraumatic.     Mouth/Throat:     Mouth: Mucous membranes are moist.     Pharynx: Oropharynx is clear.  Eyes:     Extraocular Movements: Extraocular movements intact.     Pupils: Pupils are equal, round, and reactive to light.  Cardiovascular:     Rate and Rhythm: Normal rate and regular rhythm.  Pulmonary:     Effort: Tachypnea present.     Breath sounds: Rhonchi present.  Abdominal:     General: Bowel sounds are normal.     Palpations: Abdomen is soft.  Musculoskeletal:     Cervical back: Normal range of motion and neck supple.     Right lower leg: Edema present.     Left lower leg: Edema present.  Skin:    General: Skin is warm.     Capillary Refill: Capillary refill takes less than 2 seconds.  Neurological:     General: No focal deficit present.     Mental Status: She is alert and oriented to person, place, and time.  Psychiatric:        Mood and Affect: Mood normal.        Behavior: Behavior normal.     ED Results / Procedures / Treatments   Labs (all labs ordered are listed, but only abnormal results are displayed) Labs Reviewed  BASIC METABOLIC PANEL - Abnormal; Notable for the following components:      Result Value   Calcium 8.5 (*)    All other components within normal limits  BRAIN NATRIURETIC PEPTIDE - Abnormal; Notable for the following components:   B Natriuretic Peptide 751.0 (*)    All other components within normal limits  CBC WITH DIFFERENTIAL/PLATELET - Abnormal; Notable for the  following components:   RBC 3.61 (*)    Hemoglobin 7.4 (*)    HCT 24.6 (*)    MCV 68.1 (*)    MCH 20.5 (*)    RDW 21.3 (*)    Lymphs Abs 0.4 (*)    All other components within normal limits  POC OCCULT BLOOD, ED  PREPARE RBC (CROSSMATCH)  TYPE AND SCREEN  CYTOLOGY - NON PAP    EKG EKG Interpretation  Date/Time:  Monday Aug 25 2022 09:22:44 EDT Ventricular Rate:  84 PR Interval:  159 QRS Duration: 84 QT Interval:  363 QTC Calculation:  430 R Axis:   -88 Text Interpretation: Sinus rhythm Low voltage, extremity and precordial leads No significant change since last tracing Confirmed by Jacalyn Lefevre [phone]) on 08/25/2022 9:38:14 AM  Radiology DG Chest 1 View  Result Date: 08/25/2022 CLINICAL DATA:  Post thoracentesis. EXAM: CHEST  1 VIEW COMPARISON:  08/25/2022 at 9:28 a.m. FINDINGS: Right-sided Port-A-Cath unchanged. Lungs are adequately inflated demonstrate improvement in patients right-sided pleural effusion post thoracentesis. Small amount of residual right pleural fluid with atelectasis is present in the right base. No definite pneumothorax. Remainder of the exam is unchanged. IMPRESSION: Improvement in patients right-sided pleural effusion post thoracentesis. Small amount of residual right pleural fluid with atelectasis in the right base. No pneumothorax. Electronically Signed   By: Elberta Fortis M.D.   On: 08/25/2022 14:11   CT Angio Chest PE W and/or Wo Contrast  Result Date: 08/25/2022 CLINICAL DATA:  Increased shortness of breath for over 2 months. Patient wears 4 L of continuous oxygen cannula at home due to lung cancer. EXAM: CT ANGIOGRAPHY CHEST WITH CONTRAST TECHNIQUE: Multidetector CT imaging of the chest was performed using the standard protocol during bolus administration of intravenous contrast. Multiplanar CT image reconstructions and MIPs were obtained to evaluate the vascular anatomy. RADIATION DOSE REDUCTION: This exam was performed according to the departmental  dose-optimization program which includes automated exposure control, adjustment of the mA and/or kV according to patient size and/or use of iterative reconstruction technique. CONTRAST:  75mL OMNIPAQUE IOHEXOL 350 MG/ML SOLN COMPARISON:  CT examination dated June 25, 2018 FINDINGS: Cardiovascular: Satisfactory opacification of the pulmonary arteries to the segmental level. No evidence of pulmonary embolism. Normal heart size. No pericardial effusion. Main pulmonary trunk is dilated measuring up to 3.5 cm concerning for pulmonary arterial hypertension. Right IJ access MediPort with distal tip in the SVC. Mediastinum/Nodes: No enlarged mediastinal, hilar, or axillary lymph nodes. Thyroid gland, trachea, and esophagus demonstrate no significant findings. Lungs/Pleura: Emphysematous changes with biapical pleural/parenchymal scarring. Right upper lobe spiculated mass measuring a proximally 1.8 x 1.3 cm, not significantly changed. Interval development of moderate-sized right pleural effusion. Bilateral lower lobe lung opacities, the left lower lobe opacity measuring 13 x 11 mm and partially imaged right lower lobe opacity measuring 8 x 17 mm. These are not significantly changed from prior examination and likely represent metastatic disease. Upper Abdomen: Cholecystectomy changes.  No acute abnormality. Musculoskeletal: No chest wall abnormality. No acute or significant osseous findings. T12 vertebral body hemangioma. Review of the MIP images confirms the above findings. IMPRESSION: 1. No evidence of pulmonary embolism. 2. Interval development of moderate-sized right pleural effusion. 3. Right upper lobe spiculated mass measuring 1.8 x 1.3 cm, not significantly changed from prior examination. 4. Bilateral lower lobe lung opacities, not significantly changed from prior examination and likely represent metastatic disease. 5. Dilated main pulmonary trunk measuring up to 3.5 cm concerning for pulmonary arterial hypertension.  Aortic Atherosclerosis (ICD10-I70.0) and Emphysema (ICD10-J43.9). Electronically Signed   By: Larose Hires D.O.   On: 08/25/2022 12:03   DG Chest Port 1 View  Result Date: 08/25/2022 CLINICAL DATA:  Shortness of breath. EXAM: PORTABLE CHEST 1 VIEW COMPARISON:  CT chest dated June 25, 2022 FINDINGS: The heart size and mediastinal contours are within normal limits. Hyperinflated lungs with emphysematous changes of the bilateral upper lobes. Bibasilar scarring/atelectasis, not significantly changed. Right IJ access MediPort with distal tip in the SVC. No acute osseous abnormality. IMPRESSION: 1. Hyperinflated lungs with emphysematous changes of the bilateral upper lobes, unchanged. 2.  Bibasilar scarring/atelectasis, not significantly changed. No evidence of acute pneumonia or large pleural effusion. Electronically Signed   By: Larose Hires D.O.   On: 08/25/2022 09:49    Procedures Procedures    Medications Ordered in ED Medications  0.9 %  sodium chloride infusion (Manually program via Guardrails IV Fluids) (has no administration in time range)  methylPREDNISolone sodium succinate (SOLU-MEDROL) 125 mg/2 mL injection 125 mg (125 mg Intravenous Given 08/25/22 0951)  ipratropium-albuterol (DUONEB) 0.5-2.5 (3) MG/3ML nebulizer solution 3 mL (3 mLs Nebulization Given 08/25/22 0951)  albuterol (PROVENTIL) (2.5 MG/3ML) 0.083% nebulizer solution 2.5 mg (2.5 mg Nebulization Given 08/25/22 1043)  iohexol (OMNIPAQUE) 350 MG/ML injection 75 mL (75 mLs Intravenous Contrast Given 08/25/22 1120)  lidocaine HCl (PF) (XYLOCAINE) 2 % injection 10 mL (10 mLs Other Given by Other 08/25/22 1345)    ED Course/ Medical Decision Making/ A&P                             Medical Decision Making Amount and/or Complexity of Data Reviewed Labs: ordered. Radiology: ordered.  Risk Prescription drug management. Decision regarding hospitalization.   This patient presents to the ED for concern of sob, this involves an  extensive number of treatment options, and is a complaint that carries with it a high risk of complications and morbidity.  The differential diagnosis includes pna, copd exac, PE, anemia   Co morbidities that complicate the patient evaluation  recurrent non-small lung cancer; now stage IV.  She has COPD on 4L oxygen chronically, NSTEMI, iron deficiency anemia, benign brain tumor, benign gastric tumor, gastric ulcers, duodenal AVMs with GIB   Additional history obtained:  Additional history obtained from epic chart review External records from outside source obtained and reviewed including EMS report   Lab Tests:  I Ordered, and personally interpreted labs.  The pertinent results include:  bmp nl, bnp 751, cbc with hgb 7.4 (hgb 9.2 on 4/3)   Imaging Studies ordered:  I ordered imaging studies including cxr and ct chest  I independently visualized and interpreted imaging which showed  CXR: Hyperinflated lungs with emphysematous changes of the bilateral  upper lobes, unchanged.  2. Bibasilar scarring/atelectasis, not significantly changed. No  evidence of acute pneumonia or large pleural effusion.  CT chest: No evidence of pulmonary embolism.  2. Interval development of moderate-sized right pleural effusion.  3. Right upper lobe spiculated mass measuring 1.8 x 1.3 cm, not  significantly changed from prior examination.  4. Bilateral lower lobe lung opacities, not significantly changed  from prior examination and likely represent metastatic disease.  5. Dilated main pulmonary trunk measuring up to 3.5 cm concerning  for pulmonary arterial hypertension.    Aortic Atherosclerosis (ICD10-I70.0) and Emphysema (ICD10-J43.9).   I agree with the radiologist interpretation   Cardiac Monitoring:  The patient was maintained on a cardiac monitor.  I personally viewed and interpreted the cardiac monitored which showed an underlying rhythm of: nsr   Medicines ordered and prescription  drug management:  I ordered medication including solumedrol/nebs  for sx  Reevaluation of the patient after these medicines showed that the patient improved I have reviewed the patients home medicines and have made adjustments as needed   Test Considered:  ct   Critical Interventions:  thoracentesis   Consultations Obtained:  I requested consultation with IR,  and discussed lab and imaging findings as well as pertinent plan - they will do a thoracentesis Pt d/w Dr.  Johnson ([facility]) for admission   Problem List / ED Course:  Right pleural effusion:  likely malignant.  IR drained 350 ml fluid COPD exacerbation:  pt feels a little better with steroids and nebs Anemia:  guaiac neg now, but it is possible she's been having intermittent bleeding with her hx.  She is symptomatic, so I ordered 1 unit of blood for transfusion. Resp failure:  pt is feeling better while sitting, but O2 sats drop to the mid-80s with ambulation.  Pt put back in bed and sats back up to the mid-90s on 5L.  Likely due to a combination of the copd, lung cancer, effusion, and anemia.   Reevaluation:  After the interventions noted above, I reevaluated the patient and found that they have :improved   Social Determinants of Health:  Lives at home   Dispostion:  After consideration of the diagnostic results and the patients response to treatment, I feel that the patent would benefit from admission.    CRITICAL CARE Performed by: Jacalyn Lefevre   Total critical care time: 45 minutes  Critical care time was exclusive of separately billable procedures and treating other patients.  Critical care was necessary to treat or prevent imminent or life-threatening deterioration.  Critical care was time spent personally by me on the following activities: development of treatment plan with patient and/or surrogate as well as nursing, discussions with consultants, evaluation of patient's response to treatment,  examination of patient, obtaining history from patient or surrogate, ordering and performing treatments and interventions, ordering and review of laboratory studies, ordering and review of radiographic studies, pulse oximetry and re-evaluation of patient's condition.         Final Clinical Impression(s) / ED Diagnoses Final diagnoses:  Pleural effusion  Non-small cell lung cancer, unspecified laterality (HCC)  Acute respiratory failure with hypoxia (HCC)  COPD exacerbation (HCC)    Rx / DC Orders ED Discharge Orders     None         Jacalyn Lefevre, MD 08/25/22 [phone]

## 2022-08-25 NOTE — Progress Notes (Signed)
Patients tolerated first 15 minutes of blood transfusion with no complaint or adverse effects noted or reported. Vital signs: T-97.9, P-90, R-26, BP-103/71, O2-93% at 4 liters. MD Laural Benes made aware. Per MD patient to receive IV Lasix after blood transfusion has completed.

## 2022-08-25 NOTE — Progress Notes (Signed)
Patient tolerated right sided thoracentesis procedure well today and 350 ML of pleural fluid removed and sent to lab for processing. Patient had post chest xray completed and taken back to ED room assignment at this time with no acute distress noted. Patient verbalized understanding of post procedure instructions.

## 2022-08-25 NOTE — ED Notes (Signed)
Pt back from procedure

## 2022-08-25 NOTE — Procedures (Signed)
PROCEDURE SUMMARY:  Successful US guided right thoracentesis. Yielded 350 ml of amber-colored fluid. Pt tolerated procedure well. No immediate complications.  Specimen sent for labs. CXR ordered; no post-procedure pneumothorax identified.   EBL < 2 mL  Mickie Kay, NP 08/25/2022 2:53 PM

## 2022-08-25 NOTE — Hospital Course (Signed)
72 year old female with history of recurrent non-small cell lung cancer, chronic shortness of breath, chronic hypoxic respiratory failure, oxygen dependent on 4 L/min, iron deficiency anemia, coronary artery disease with history of NSTEMI, history of gastric ulcers, history of duodenal AVMs, history of GI bleeding, has been followed and treated by Dr. Shirline Frees with oncology and currently under surveillance and chemotherapy has been on hold due to problems with hypotension and anemia.  Patient presented today with vague complaints of shortness of breath for 2 months.  Apparently her shortness of breath has been worse with exertion.  Patient denies having chest pain fever and chills nausea vomiting and diarrhea.  She occasionally has cough but nonproductive.  She was sent for CT of her chest today with findings of moderately sized right pleural effusion.  She has an unchanged right upper lobe spiculated mass measuring 1.8 x 1.3 cm and bilateral lower lobe lung opacities not changed from prior examination and representing metastatic disease.  She was sent for thoracentesis.  Her shortness of breath did not significantly improve after the thoracentesis.  Her exam suggested wheezing and Rales consistent with a COPD exacerbation and admission was requested for further management of this as a cause to her acutely worsening shortness of breath.  She was noted to have a 2 g drop in hemoglobin from 9.2-7.4 in the last 1 month.  She was ordered 1 unit PRBC in the ED for symptomatic anemia.  Her stool was guaiac negative today.

## 2022-08-26 DIAGNOSIS — D62 Acute posthemorrhagic anemia: Secondary | ICD-10-CM

## 2022-08-26 DIAGNOSIS — J90 Pleural effusion, not elsewhere classified: Secondary | ICD-10-CM | POA: Diagnosis not present

## 2022-08-26 DIAGNOSIS — J9621 Acute and chronic respiratory failure with hypoxia: Secondary | ICD-10-CM | POA: Diagnosis not present

## 2022-08-26 DIAGNOSIS — K219 Gastro-esophageal reflux disease without esophagitis: Secondary | ICD-10-CM

## 2022-08-26 DIAGNOSIS — E43 Unspecified severe protein-calorie malnutrition: Secondary | ICD-10-CM | POA: Diagnosis not present

## 2022-08-26 LAB — CBC
HCT: 27.1 % — ABNORMAL LOW (ref 36.0–46.0)
Hemoglobin: 8.3 g/dL — ABNORMAL LOW (ref 12.0–15.0)
MCH: 22.1 pg — ABNORMAL LOW (ref 26.0–34.0)
MCHC: 30.6 g/dL (ref 30.0–36.0)
MCV: 72.1 fL — ABNORMAL LOW (ref 80.0–100.0)
Platelets: 270 10*3/uL (ref 150–400)
RBC: 3.76 MIL/uL — ABNORMAL LOW (ref 3.87–5.11)
RDW: 23.6 % — ABNORMAL HIGH (ref 11.5–15.5)
WBC: 4.4 10*3/uL (ref 4.0–10.5)
nRBC: 0.7 % — ABNORMAL HIGH (ref 0.0–0.2)

## 2022-08-26 LAB — BASIC METABOLIC PANEL
Anion gap: 7 (ref 5–15)
BUN: 19 mg/dL (ref 8–23)
CO2: 32 mmol/L (ref 22–32)
Calcium: 8.4 mg/dL — ABNORMAL LOW (ref 8.9–10.3)
Chloride: 102 mmol/L (ref 98–111)
Creatinine, Ser: 0.58 mg/dL (ref 0.44–1.00)
GFR, Estimated: >60 mL/min (ref >60)
Glucose, Bld: 77 mg/dL (ref 70–99)
Potassium: 3.8 mmol/L (ref 3.5–5.1)
Sodium: 141 mmol/L (ref 135–145)

## 2022-08-26 LAB — BPAM RBC: Blood Product Expiration Date: 202406202359

## 2022-08-26 LAB — CYTOLOGY - NON PAP

## 2022-08-26 LAB — MAGNESIUM: Magnesium: 1.7 mg/dL (ref 1.7–2.4)

## 2022-08-26 MED ORDER — IPRATROPIUM-ALBUTEROL 0.5-2.5 (3) MG/3ML IN SOLN
3.0000 mL | Freq: Two times a day (BID) | RESPIRATORY_TRACT | Status: DC
  Administered 2022-08-27 – 2022-08-28 (×3): 3 mL via RESPIRATORY_TRACT
  Filled 2022-08-26 (×3): qty 3

## 2022-08-26 MED ORDER — IPRATROPIUM-ALBUTEROL 0.5-2.5 (3) MG/3ML IN SOLN
3.0000 mL | RESPIRATORY_TRACT | Status: DC | PRN
  Administered 2022-08-26: 3 mL via RESPIRATORY_TRACT
  Filled 2022-08-26: qty 3

## 2022-08-26 NOTE — TOC Initial Note (Signed)
Transition of Care [facility]) - Initial/Assessment Note    Patient Details  Name: Courtney Zuniga MRN: [id number] Date of Birth: January 03, 1951  Transition of Care [facility]) CM/SW Contact:    Karn Cassis, LCSW Phone Number: 08/26/2022, 10:55 AM  Clinical Narrative: Pt admitted with acute on chronic respiratory failure with hypoxia. Assessment completed due to high risk readmission score. Pt reports she lives with her son. She requires some assist with bathing, but is otherwise fairly independent with ADLs. Pt is on 4L home O2 ([employer]). She uses [employer] for transportation. Pt plans to return home when medically stable. No needs reported at this time. TOC will continue to follow.                  Expected Discharge Plan: Home/Self Care Barriers to Discharge: Continued Medical Work up   Patient Goals and CMS Choice Patient states their goals for this hospitalization and ongoing recovery are:: return home   Choice offered to / list presented to : Patient [place] ownership interest in [facility].provided to::  (n/a)    Expected Discharge Plan and Services In-house Referral: Clinical Social Work     Living arrangements for the past 2 months: Single Family Home                                      Prior Living Arrangements/Services Living arrangements for the past 2 months: Single Family Home Lives with:: Adult Children Patient language and need for interpreter reviewed:: Yes Do you feel safe going back to the place where you live?: Yes      Need for Family Participation in Patient Care: No (Comment) Care giver support system in place?: Yes (comment) Current home services: DME (shower, chair, home O2, walker) Criminal Activity/Legal Involvement Pertinent to Current Situation/Hospitalization: No - Comment as needed  Activities of Daily Living Home Assistive Devices/Equipment: Bedside commode/3-in-1, Eyeglasses, Dentures (specify type), Nebulizer, Oxygen, Shower  chair with back ADL Screening (condition at time of admission) Patient's cognitive ability adequate to safely complete daily activities?: Yes Is the patient deaf or have difficulty hearing?: No Does the patient have difficulty seeing, even when wearing glasses/contacts?: No Does the patient have difficulty concentrating, remembering, or making decisions?: No Patient able to express need for assistance with ADLs?: Yes Does the patient have difficulty dressing or bathing?: Yes Independently performs ADLs?: No Communication: Independent Dressing (OT): Needs assistance Is this a change from baseline?: Change from baseline, expected to last <3days Grooming: Independent Feeding: Independent Bathing: Needs assistance Is this a change from baseline?: Change from baseline, expected to last <3 days Toileting: Needs assistance Is this a change from baseline?: Change from baseline, expected to last <3 days In/Out Bed: Needs assistance Is this a change from baseline?: Change from baseline, expected to last <3 days Walks in Home: Independent Does the patient have difficulty walking or climbing stairs?: Yes Weakness of Legs: Both Weakness of Arms/Hands: Both  Permission Sought/Granted                  Emotional Assessment     Affect (typically observed): Appropriate Orientation: : Oriented to Self, Oriented to Place, Oriented to  Time, Oriented to Situation Alcohol / Substance Use: Not Applicable Psych Involvement: No (comment)  Admission diagnosis:  Pleural effusion [J90] COPD exacerbation (HCC) [J44.1] Acute respiratory failure with hypoxia (HCC) [J96.01] Non-small cell lung cancer, unspecified laterality (HCC) [C34.90] Acute  on chronic respiratory failure with hypoxia (HCC) [J96.21] Patient Active Problem List   Diagnosis Date Noted   Acute on chronic respiratory failure with hypoxia (HCC) 08/25/2022   Pleural effusion 08/25/2022   Hypotension 06/12/2022   Iron deficiency  anemia 06/12/2022   Acute on chronic anemia 05/20/2022   Gastric ulcer 05/20/2022   AVM (arteriovenous malformation) of small bowel, 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   Upper GI bleed 05/18/2022   Acute blood loss anemia 05/18/2022   Acute respiratory failure with hypoxia (HCC) 04/23/2022   Influenza B 04/23/2022   Frequent PVCs 04/09/2022   Port-A-Cath in place 03/03/2022   Increased frequency of headaches 10/18/2020   COPD exacerbation (HCC) 11/05/2019   Chronic nonintractable headache    Acute on chronic respiratory failure with hypoxia and hypercapnia (HCC) 06/16/2018   Influenza A 06/16/2018   Severe protein-calorie malnutrition (HCC) 06/16/2018   GERD (gastroesophageal reflux disease) 06/16/2018   Bilateral pneumonia 05/05/2018   Sepsis due to CAP 05/05/2018   Chronic respiratory failure (HCC) 05/05/2018   Encounter for antineoplastic immunotherapy 04/23/2017   Depression 05/15/2016   Adenocarcinoma of right lung, stage 4 01/10/2016   Encounter for antineoplastic chemotherapy 01/10/2016   Lung mass 01/03/2016   Arthritis    Subepithelial gastric mass    Mucosal abnormality of stomach    Abdominal pain 08/08/2015   Abnormal CT of the abdomen 08/08/2015   Influenza with pneumonia 06/27/2014   Hypoxia 06/26/2014   COPD with acute exacerbation (HCC) 06/26/2014   CAP (community acquired pneumonia)    Chest pain of uncertain etiology    Asthma, chronic    Chest pain 02/16/2014   HTN (hypertension) 02/16/2014   DDD (degenerative disc disease), lumbar 02/04/2011   Lumbar herniated disc 01/09/2011   PCP:  Oneal Grout, FNP Pharmacy:   [facility] - [place], [place] - [address] [place] [address] Phone: [phone] Fax: [phone]  [facility] - [place], [place] - [address] [place] [address] Phone: [phone] Fax: [phone]     Social Determinants of Health (SDOH) Social  History: SDOH Screenings   Food Insecurity: No Food Insecurity (08/25/2022)  Housing: Low Risk  (08/25/2022)  Transportation Needs: No Transportation Needs (08/25/2022)  Utilities: Not At Risk (08/25/2022)  Financial Resource Strain: Low Risk  (05/05/2018)  Physical Activity: Insufficiently Active (05/05/2018)  Social Connections: Somewhat Isolated (05/05/2018)  Stress: No Stress Concern Present (05/05/2018)  Tobacco Use: Medium Risk (08/25/2022)   SDOH Interventions:     Readmission Risk Interventions    08/26/2022   10:43 AM 05/19/2022   12:12 PM 04/26/2022    1:03 PM  Readmission Risk Prevention Plan  Transportation Screening Complete Complete Complete  HRI or Home Care Consult  Complete Complete  Social Work Consult for Recovery Care Planning/Counseling  Complete Complete  Palliative Care Screening  Not Applicable Not Applicable  Medication Review Oceanographer) Complete Complete Complete  HRI or Home Care Consult Complete    SW Recovery Care/Counseling Consult Complete    Palliative Care Screening Not Applicable    Skilled Nursing Facility Not Applicable

## 2022-08-26 NOTE — TOC Progression Note (Signed)
Transition of Care [facility]) - Progression Note    Patient Details  Name: Courtney Zuniga MRN: [id number] Date of Birth: Jun 04, 1950  Transition of Care [facility]) CM/SW Contact  Karn Cassis, [place] Phone Number: 08/26/2022, 3:44 PM  Clinical Narrative: PT recommending HHPT and rolling walker. Discussed with pt who is agreeable with no preference on agency. Rolling walker referred to [place] with [employer] to drop ship to pt's home. Order in. TOC working on home health agency.       Expected Discharge Plan: Home/Self Care Barriers to Discharge: Continued Medical Work up  Expected Discharge Plan and Services In-house Referral: Clinical Social Work     Living arrangements for the past 2 months: Single Family Home                                       Social Determinants of Health (SDOH) Interventions SDOH Screenings   Food Insecurity: No Food Insecurity (08/25/2022)  Housing: Low Risk  (08/25/2022)  Transportation Needs: No Transportation Needs (08/25/2022)  Utilities: Not At Risk (08/25/2022)  Financial Resource Strain: Low Risk  (05/05/2018)  Physical Activity: Insufficiently Active (05/05/2018)  Social Connections: Somewhat Isolated (05/05/2018)  Stress: No Stress Concern Present (05/05/2018)  Tobacco Use: Medium Risk (08/25/2022)    Readmission Risk Interventions    08/26/2022   10:43 AM 05/19/2022   12:12 PM 04/26/2022    1:03 PM  Readmission Risk Prevention Plan  Transportation Screening Complete Complete Complete  HRI or Home Care Consult  Complete Complete  Social Work Consult for Recovery Care Planning/Counseling  Complete Complete  Palliative Care Screening  Not Applicable Not Applicable  Medication Review Oceanographer) Complete Complete Complete  HRI or Home Care Consult Complete    SW Recovery Care/Counseling Consult Complete    Palliative Care Screening Not Applicable    Skilled Nursing Facility Not Applicable

## 2022-08-26 NOTE — Plan of Care (Signed)
  Problem: Acute Rehab PT Goals(only PT should resolve) Goal: Pt Will Go Supine/Side To Sit Outcome: Progressing Flowsheets (Taken 08/26/2022 1545) Pt will go Supine/Side to Sit:  Independently  with modified independence Goal: Patient Will Transfer Sit To/From Stand Outcome: Progressing Flowsheets (Taken 08/26/2022 1545) Patient will transfer sit to/from stand:  Independently  with modified independence Goal: Pt Will Transfer Bed To Chair/Chair To Bed Outcome: Progressing Flowsheets (Taken 08/26/2022 1545) Pt will Transfer Bed to Chair/Chair to Bed:  Independently  with modified independence Goal: Pt Will Ambulate Outcome: Progressing Flowsheets (Taken 08/26/2022 1545) Pt will Ambulate:  100 feet  with modified independence  with rolling walker  with least restrictive assistive device   3:45 PM, 08/26/22 Ocie Bob, MPT Physical Therapist with [facility] [phone] office [phone] mobile phone

## 2022-08-26 NOTE — Evaluation (Signed)
Physical Therapy Evaluation Patient Details Name: Courtney Zuniga MRN: [id number] DOB: [birth year] Today's Date: 08/26/2022  History of Present Illness  Courtney Zuniga is a 72 year old female with history of recurrent non-small cell lung cancer, chronic shortness of breath, chronic hypoxic respiratory failure, oxygen dependent on 4 L/min, iron deficiency anemia, coronary artery disease with history of NSTEMI, history of gastric ulcers, history of duodenal AVMs, history of GI bleeding, has been followed and treated by Dr. Shirline Frees with oncology and currently under surveillance and chemotherapy has been on hold due to problems with hypotension and anemia.     Patient presented today with vague complaints of shortness of breath for 2 months.  Apparently her shortness of breath has been worse with exertion.  Patient denies having chest pain fever and chills nausea vomiting and diarrhea.  She occasionally has cough but nonproductive.  She was sent for CT of her chest today with findings of moderately sized right pleural effusion.  She has an unchanged right upper lobe spiculated mass measuring 1.8 x 1.3 cm and bilateral lower lobe lung opacities not changed from prior examination and representing metastatic disease.  She was sent for thoracentesis.  Her shortness of breath did not significantly improve after the thoracentesis.  Her exam suggested wheezing and Rales consistent with a COPD exacerbation and admission was requested for further management of this as a cause to her acutely worsening shortness of breath.  She was noted to have a 2 g drop in hemoglobin from 9.2-7.4 in the last 1 month.  She was ordered 1 unit PRBC in the ED for symptomatic anemia.  Her stool was guaiac negative today.   Clinical Impression  Patient demonstrates good return for bed mobility, but has poor tolerance for lying flat and uses pillows at home, good return for transferring to/from chair without AD, but requires use of RW for  safety when walking longer distances due to generalized weakness and difficulty breathing on exertion while on 4 LPM with SpO2 dropping from 95% to 88%, that recovered to 95% after sitting in chair.  Patient will benefit from continued skilled physical therapy in hospital and recommended venue below to increase strength, balance, endurance for safe ADLs and gait.      Recommendations for follow up therapy are one component of a multi-disciplinary discharge planning process, led by the attending physician.  Recommendations may be updated based on patient status, additional functional criteria and insurance authorization.  Follow Up Recommendations       Assistance Recommended at Discharge PRN  Patient can return home with the following  A little help with walking and/or transfers;A little help with bathing/dressing/bathroom;Help with stairs or ramp for entrance;Assistance with cooking/housework    Equipment Recommendations Rolling walker (2 wheels)  Recommendations for Other Services       Functional Status Assessment Patient has had a recent decline in their functional status and demonstrates the ability to make significant improvements in function in a reasonable and predictable amount of time.     Precautions / Restrictions Precautions Precautions: Fall Restrictions Weight Bearing Restrictions: No      Mobility  Bed Mobility Overal bed mobility: Modified Independent             General bed mobility comments: poor tolerance for lying flat    Transfers Overall transfer level: Modified independent                 General transfer comment: slightly labored movement    Ambulation/Gait Ambulation/Gait assistance: Supervision, Min  guard Gait Distance (Feet): 55 Feet Assistive device: Rolling walker (2 wheels) Gait Pattern/deviations: Decreased step length - right, Decreased step length - left, Decreased stride length Gait velocity: decreased     General Gait  Details: slow slightly labored cadence having to lean on RW due to generalized weakness, limited mostly due to c/o fatigue, diffiuclty breathing while on 4 LPM with SpO2 dropping from 95% to 88%, but recovered to 95% after standing rest breaks  Stairs            Wheelchair Mobility    Modified Rankin (Stroke Patients Only)       Balance Overall balance assessment: Needs assistance Sitting-balance support: Feet supported, No upper extremity supported Sitting balance-Leahy Scale: Good Sitting balance - Comments: seated at EOB   Standing balance support: During functional activity, No upper extremity supported Standing balance-Leahy Scale: Fair Standing balance comment: fair/good using RW                             Pertinent Vitals/Pain Pain Assessment Pain Assessment: No/denies pain    Home Living Family/patient expects to be discharged to:: Private residence Living Arrangements: Children Available Help at Discharge: Family Type of Home: House Home Access: Stairs to enter Entrance Stairs-Rails: Left Entrance Stairs-Number of Steps: 3   Home Layout: One level Home Equipment: Agricultural consultant (2 wheels);Shower seat Additional Comments: 4 LPM O2 at home    Prior Function Prior Level of Function : Independent/Modified Independent             Mobility Comments: Household and short distanced community ambulator without use of AD, on 4 LPM O2 at home ADLs Comments: Assisted by family     Hand Dominance   Dominant Hand: Right    Extremity/Trunk Assessment   Upper Extremity Assessment Upper Extremity Assessment: Overall WFL for tasks assessed    Lower Extremity Assessment Lower Extremity Assessment: Generalized weakness    Cervical / Trunk Assessment Cervical / Trunk Assessment: Normal  Communication   Communication: No difficulties  Cognition Arousal/Alertness: Awake/alert Behavior During Therapy: WFL for tasks assessed/performed Overall  Cognitive Status: Within Functional Limits for tasks assessed                                          General Comments      Exercises     Assessment/Plan    PT Assessment Patient needs continued PT services  PT Problem List Decreased strength;Decreased activity tolerance;Decreased balance;Decreased mobility       PT Treatment Interventions DME instruction;Gait training;Stair training;Functional mobility training;Therapeutic activities;Therapeutic exercise;Patient/family education;Balance training    PT Goals (Current goals can be found in the Care Plan section)  Acute Rehab PT Goals Patient Stated Goal: return home with family to assist PT Goal Formulation: With patient Time For Goal Achievement: 08/29/22 Potential to Achieve Goals: Good    Frequency Min 2X/week     Co-evaluation               AM-PAC PT "6 Clicks" Mobility  Outcome Measure Help needed turning from your back to your side while in a flat bed without using bedrails?: None Help needed moving from lying on your back to sitting on the side of a flat bed without using bedrails?: None Help needed moving to and from a bed to a chair (including a wheelchair)?: A Little  Help needed standing up from a chair using your arms (e.g., wheelchair or bedside chair)?: A Little Help needed to walk in hospital room?: A Little Help needed climbing 3-5 steps with a railing? : A Little 6 Click Score: 20    End of Session Equipment Utilized During Treatment: Oxygen Activity Tolerance: Patient tolerated treatment well;Patient limited by fatigue Patient left: in bed;with call bell/phone within reach Nurse Communication: Mobility status PT Visit Diagnosis: Unsteadiness on feet (R26.81);Other abnormalities of gait and mobility (R26.89);Muscle weakness (generalized) (M62.81)    Time: 1610-9604 PT Time Calculation (min) (ACUTE ONLY): 23 min   Charges:   PT Evaluation $PT Eval Moderate Complexity: 1  Mod PT Treatments $Therapeutic Activity: 23-37 mins        3:41 PM, 08/26/22 Ocie Bob, MPT Physical Therapist with [facility] [phone] office [phone] mobile phone

## 2022-08-26 NOTE — Progress Notes (Signed)
Patient ambulated in the hallway with PT, patient complained of shortness of breath. Noted expiratory wheeze. RT called to give neb treatment. MD Laural Benes made aware.

## 2022-08-26 NOTE — Progress Notes (Signed)
PROGRESS NOTE   Courtney Zuniga  ZOX:[id number] DOB: 02/24/1951 DOA: 08/25/2022 PCP: Oneal Grout, FNP   Chief Complaint  Patient presents with   Shortness of Breath   Level of care: Telemetry  Brief Admission History:  72 year old female with history of recurrent non-small cell lung cancer, chronic shortness of breath, chronic hypoxic respiratory failure, oxygen dependent on 4 L/min, iron deficiency anemia, coronary artery disease with history of NSTEMI, history of gastric ulcers, history of duodenal AVMs, history of GI bleeding, has been followed and treated by Dr. Shirline Frees with oncology and currently under surveillance and chemotherapy has been on hold due to problems with hypotension and anemia.  Patient presented today with vague complaints of shortness of breath for 2 months.  Apparently her shortness of breath has been worse with exertion.  Patient denies having chest pain fever and chills nausea vomiting and diarrhea.  She occasionally has cough but nonproductive.  She was sent for CT of her chest today with findings of moderately sized right pleural effusion.  She has an unchanged right upper lobe spiculated mass measuring 1.8 x 1.3 cm and bilateral lower lobe lung opacities not changed from prior examination and representing metastatic disease.  She was sent for thoracentesis.  Her shortness of breath did not significantly improve after the thoracentesis.  Her exam suggested wheezing and Rales consistent with a COPD exacerbation and admission was requested for further management of this as a cause to her acutely worsening shortness of breath.  She was noted to have a 2 g drop in hemoglobin from 9.2-7.4 in the last 1 month.  She was ordered 1 unit PRBC in the ED for symptomatic anemia.  Her stool was guaiac negative today.   Assessment and Plan:  Acute on chronic respiratory failure with hypoxia Secondary to mild acute COPD exacerbation Secondary to moderate-sized right pleural  effusion Second to advancing stage IV recurrent non-small cell lung cancer Secondary to acute on chronic anemia with hemoglobin loss of 2 g in 1 month --Patient was transfused 1 unit PRBC for symptomatic anemia with Hg up to 8.4 after transfusion --Treating COPD exacerbation with IV steroids and bronchodilators -- Continue supplemental oxygen as ordered with goal pulse ox greater than 88% -- s/p thoracentesis of right sided pleural effusion on 5/13 with 350 mL fluid removed -- ambulate with PT today    Acute on chronic anemia -- Patient has a long history of GI bleeding with AVM and gastric ulcer -- Fortunately patient is stool guaiac was negative on 5/13 -- Transfused 1 unit PRBC on 5/13, Hg up to 8.4  -- Pantoprazole ordered for GI protection -- Repeat CBC in a.m.   Recurrent non-small cell lung cancer stage IV -- Patient is currently in surveillance by Dr. Shirline Frees and not currently on chemotherapy -- Patient to follow-up with Dr. Arbutus Ped   History of AVM and gastric ulcers GERD -- Pantoprazole ordered for GI protection -- no evidence of active GI bleed at this time -- recheck CBC in AM, if Hg stable, consider DC home   Severe protein calorie malnutrition -- Regular diet and supplementation ordered for additional calories   Essential hypertension -- Blood pressure well-controlled at this time will follow  DVT prophylaxis: SCDs Code Status: Full  Family Communication:  Disposition: Status is: Inpatient    Consultants:   Procedures:  Thoracentesis 5/13 right lung pleural effusion  Antimicrobials:    Subjective: Pt says she still is feeling weak after blood transfusion, still SOB.  Nonproductive cough  and chest congestion persists.  Objective: Vitals:   08/26/22 0300 08/26/22 0813 08/26/22 0814 08/26/22 1258  BP: (!) 90/54   111/67  Pulse: 92   100  Resp: 20   (!) 24  Temp: 98.2 F (36.8 C)   98.8 F (37.1 C)  TempSrc: Axillary   Oral  SpO2: 96% 92% 96% 98%   Weight:      Height:        Intake/Output Summary (Last 24 hours) at 08/26/2022 1308 Last data filed at 08/26/2022 1300 Gross per 24 hour  Intake 800.17 ml  Output 600 ml  Net 200.17 ml   Filed Weights   08/25/22 0925 08/25/22 1639  Weight: 54.3 kg 57.3 kg   Examination:  General exam: frail, cachectic, chronically ill appearing female, awake, cooperative, Appears calm and comfortable  Respiratory system: shallow breathing bilateral, diminished BS left base. Cardiovascular system: normal S1 & S2 heard. No JVD, murmurs, rubs, gallops or clicks. No pedal edema. Gastrointestinal system: Abdomen is nondistended, soft and nontender. No organomegaly or masses felt. Normal bowel sounds heard. Central nervous system: Alert and oriented. No focal neurological deficits. Extremities: Symmetric 5 x 5 power. Skin: No rashes, lesions or ulcers. Psychiatry: Judgement and insight appear normal. Mood & affect flat.   Data Reviewed: I have personally reviewed following labs and imaging studies  CBC: Recent Labs  Lab 08/25/22 1227 08/26/22 0445  WBC 4.7 4.4  NEUTROABS 4.2  --   HGB 7.4* 8.3*  HCT 24.6* 27.1*  MCV 68.1* 72.1*  PLT 326 270    Basic Metabolic Panel: Recent Labs  Lab 08/25/22 0923 08/26/22 0445  NA 140 141  K 3.6 3.8  CL 104 102  CO2 28 32  GLUCOSE 89 77  BUN 19 19  CREATININE 0.60 0.58  CALCIUM 8.5* 8.4*  MG  --  1.7    CBG: No results for input(s): "GLUCAP" in the last 168 hours.  No results found for this or any previous visit (from the past 240 hour(s)).   Radiology Studies: US THORACENTESIS ASP PLEURAL SPACE W/IMG GUIDE  Result Date: 08/25/2022 INDICATION: Patient with a history of lung cancer presents today with a right pleural effusion. Interventional Radiology asked to perform a diagnostic and therapeutic thoracentesis. EXAM: ULTRASOUND GUIDED THORACENTESIS MEDICATIONS: 1% lidocaine 10 ml COMPLICATIONS: None immediate. PROCEDURE: An ultrasound guided  thoracentesis was thoroughly discussed with the patient and questions answered. The benefits, risks, alternatives and complications were also discussed. The patient understands and wishes to proceed with the procedure. Written consent was obtained. Ultrasound was performed to localize and mark an adequate pocket of fluid in the right chest. The area was then prepped and draped in the normal sterile fashion. 1% Lidocaine was used for local anesthesia. Under ultrasound guidance a 6 Fr Safe-T-Centesis catheter was introduced. Thoracentesis was performed. The catheter was removed and a dressing applied. FINDINGS: A total of approximately 350 ml of amber-colored fluid was removed. Samples were sent to the laboratory as requested by the clinical team. IMPRESSION: Successful ultrasound guided right thoracentesis yielding 350 ml of pleural fluid. Procedure performed by: Alwyn Ren, NP Electronically Signed   By: Olive Bass M.D.   On: 08/25/2022 15:07   DG Chest 1 View  Result Date: 08/25/2022 CLINICAL DATA:  Post thoracentesis. EXAM: CHEST  1 VIEW COMPARISON:  08/25/2022 at 9:28 a.m. FINDINGS: Right-sided Port-A-Cath unchanged. Lungs are adequately inflated demonstrate improvement in patients right-sided pleural effusion post thoracentesis. Small amount of residual right pleural fluid with  atelectasis is present in the right base. No definite pneumothorax. Remainder of the exam is unchanged. IMPRESSION: Improvement in patients right-sided pleural effusion post thoracentesis. Small amount of residual right pleural fluid with atelectasis in the right base. No pneumothorax. Electronically Signed   By: Elberta Fortis M.D.   On: 08/25/2022 14:11   CT Angio Chest PE W and/or Wo Contrast  Result Date: 08/25/2022 CLINICAL DATA:  Increased shortness of breath for over 2 months. Patient wears 4 L of continuous oxygen cannula at home due to lung cancer. EXAM: CT ANGIOGRAPHY CHEST WITH CONTRAST TECHNIQUE: Multidetector  CT imaging of the chest was performed using the standard protocol during bolus administration of intravenous contrast. Multiplanar CT image reconstructions and MIPs were obtained to evaluate the vascular anatomy. RADIATION DOSE REDUCTION: This exam was performed according to the departmental dose-optimization program which includes automated exposure control, adjustment of the mA and/or kV according to patient size and/or use of iterative reconstruction technique. CONTRAST:  75mL OMNIPAQUE IOHEXOL 350 MG/ML SOLN COMPARISON:  CT examination dated June 25, 2018 FINDINGS: Cardiovascular: Satisfactory opacification of the pulmonary arteries to the segmental level. No evidence of pulmonary embolism. Normal heart size. No pericardial effusion. Main pulmonary trunk is dilated measuring up to 3.5 cm concerning for pulmonary arterial hypertension. Right IJ access MediPort with distal tip in the SVC. Mediastinum/Nodes: No enlarged mediastinal, hilar, or axillary lymph nodes. Thyroid gland, trachea, and esophagus demonstrate no significant findings. Lungs/Pleura: Emphysematous changes with biapical pleural/parenchymal scarring. Right upper lobe spiculated mass measuring a proximally 1.8 x 1.3 cm, not significantly changed. Interval development of moderate-sized right pleural effusion. Bilateral lower lobe lung opacities, the left lower lobe opacity measuring 13 x 11 mm and partially imaged right lower lobe opacity measuring 8 x 17 mm. These are not significantly changed from prior examination and likely represent metastatic disease. Upper Abdomen: Cholecystectomy changes.  No acute abnormality. Musculoskeletal: No chest wall abnormality. No acute or significant osseous findings. T12 vertebral body hemangioma. Review of the MIP images confirms the above findings. IMPRESSION: 1. No evidence of pulmonary embolism. 2. Interval development of moderate-sized right pleural effusion. 3. Right upper lobe spiculated mass measuring 1.8  x 1.3 cm, not significantly changed from prior examination. 4. Bilateral lower lobe lung opacities, not significantly changed from prior examination and likely represent metastatic disease. 5. Dilated main pulmonary trunk measuring up to 3.5 cm concerning for pulmonary arterial hypertension. Aortic Atherosclerosis (ICD10-I70.0) and Emphysema (ICD10-J43.9). Electronically Signed   By: Larose Hires D.O.   On: 08/25/2022 12:03   DG Chest Port 1 View  Result Date: 08/25/2022 CLINICAL DATA:  Shortness of breath. EXAM: PORTABLE CHEST 1 VIEW COMPARISON:  CT chest dated June 25, 2022 FINDINGS: The heart size and mediastinal contours are within normal limits. Hyperinflated lungs with emphysematous changes of the bilateral upper lobes. Bibasilar scarring/atelectasis, not significantly changed. Right IJ access MediPort with distal tip in the SVC. No acute osseous abnormality. IMPRESSION: 1. Hyperinflated lungs with emphysematous changes of the bilateral upper lobes, unchanged. 2. Bibasilar scarring/atelectasis, not significantly changed. No evidence of acute pneumonia or large pleural effusion. Electronically Signed   By: Larose Hires D.O.   On: 08/25/2022 09:49    Scheduled Meds:  B-complex with vitamin C  1 tablet Oral Daily   And   folic acid  1 mg Oral Daily   Chlorhexidine Gluconate Cloth  6 each Topical Daily   DULoxetine  60 mg Oral BID   enoxaparin (LOVENOX) injection  40 mg Subcutaneous  Q24H   feeding supplement  237 mL Oral TID BM   fluticasone furoate-vilanterol  1 puff Inhalation Daily   megestrol  625 mg Oral Daily   methylPREDNISolone (SOLU-MEDROL) injection  40 mg Intravenous Q12H   pantoprazole  40 mg Oral BID AC   tamsulosin  0.4 mg Oral QPC supper   traZODone  50 mg Oral QHS   Continuous Infusions:   LOS: 1 day   Time spent: 36 mins  Darenda Fike Laural Benes, MD How to contact the [facility] Attending or Consulting provider 7A - 7P or covering provider during after hours 7P -7A, for this  patient?  Check the care team in [facility] and look for a) attending/consulting [facility] provider listed and b) the [facility] team listed Log into [email or web address] and use [place]'s universal password to access. If you do not have the password, please contact the hospital operator. Locate the [facility] provider you are looking for under [facility] and page to a number that you can be directly reached. If you still have difficulty reaching the provider, please page the [facility] (Director on Call) for the Hospitalists listed on amion for assistance.  08/26/2022, 1:08 PM

## 2022-08-27 DIAGNOSIS — J9621 Acute and chronic respiratory failure with hypoxia: Secondary | ICD-10-CM | POA: Diagnosis not present

## 2022-08-27 LAB — CBC
HCT: 27.7 % — ABNORMAL LOW (ref 36.0–46.0)
Hemoglobin: 8.4 g/dL — ABNORMAL LOW (ref 12.0–15.0)
MCH: 22 pg — ABNORMAL LOW (ref 26.0–34.0)
MCHC: 30.3 g/dL (ref 30.0–36.0)
MCV: 72.7 fL — ABNORMAL LOW (ref 80.0–100.0)
Platelets: 286 10*3/uL (ref 150–400)
RBC: 3.81 MIL/uL — ABNORMAL LOW (ref 3.87–5.11)
RDW: 23.9 % — ABNORMAL HIGH (ref 11.5–15.5)
WBC: 5.6 10*3/uL (ref 4.0–10.5)
nRBC: 0.4 % — ABNORMAL HIGH (ref 0.0–0.2)

## 2022-08-27 MED ORDER — ALBUTEROL SULFATE (2.5 MG/3ML) 0.083% IN NEBU: 2.5000 mg | INHALATION_SOLUTION | RESPIRATORY_TRACT | Status: DC | PRN

## 2022-08-27 NOTE — Plan of Care (Signed)
  Problem: Clinical Measurements: Goal: Will remain free from infection Outcome: Progressing   Problem: Activity: Goal: Risk for activity intolerance will decrease Outcome: Progressing   Problem: Pain Managment: Goal: General experience of comfort will improve Outcome: Progressing   Problem: Skin Integrity: Goal: Risk for impaired skin integrity will decrease Outcome: Progressing   

## 2022-08-27 NOTE — Progress Notes (Signed)
Mobility Specialist Progress Note:    08/27/22 1000  Mobility  Activity Ambulated with assistance in hallway;Transferred from bed to chair  Level of Assistance Contact guard assist, steadying assist  Assistive Device Front wheel walker  Distance Ambulated (ft) 80 ft  Range of Motion/Exercises Active;Right arm;Right leg;Left arm;Left leg  Activity Response Tolerated well  Mobility Referral Yes  $Mobility charge 1 Mobility  Mobility Specialist Start Time (ACUTE ONLY) 1000  Mobility Specialist Stop Time (ACUTE ONLY) 1015  Mobility Specialist Time Calculation (min) (ACUTE ONLY) 15 min   Pt eager for mobility session, received in bed. SpO2 90% on 4L while dangling EOB. Ambulated in hallway with RW, CGA for safety. Tolerated well, had audible SOB throughout.  SpO2 95% on 4L during ambulation.Returned pt to room, sitting up in chair, nurse notified, all needs met.   Feliciana Rossetti Mobility Specialist Please contact via Special educational needs teacher or  Rehab office at [phone]

## 2022-08-27 NOTE — Progress Notes (Signed)
Physical Therapy Treatment Patient Details Name: Courtney Zuniga MRN: [id number] DOB: 09/27/1950 Today's Date: 08/27/2022   History of Present Illness Courtney Zuniga is a 72 year old female with history of recurrent non-small cell lung cancer, chronic shortness of breath, chronic hypoxic respiratory failure, oxygen dependent on 4 L/min, iron deficiency anemia, coronary artery disease with history of NSTEMI, history of gastric ulcers, history of duodenal AVMs, history of GI bleeding, has been followed and treated by Dr. Shirline Zuniga with oncology and currently under surveillance and chemotherapy has been on hold due to problems with hypotension and anemia.     Patient presented today with vague complaints of shortness of breath for 2 months.  Apparently her shortness of breath has been worse with exertion.  Patient denies having chest pain fever and chills nausea vomiting and diarrhea.  She occasionally has cough but nonproductive.  She was sent for CT of her chest today with findings of moderately sized right pleural effusion.  She has an unchanged right upper lobe spiculated mass measuring 1.8 x 1.3 cm and bilateral lower lobe lung opacities not changed from prior examination and representing metastatic disease.  She was sent for thoracentesis.  Her shortness of breath did not significantly improve after the thoracentesis.  Her exam suggested wheezing and Rales consistent with a COPD exacerbation and admission was requested for further management of this as a cause to her acutely worsening shortness of breath.  She was noted to have a 2 g drop in hemoglobin from 9.2-7.4 in the last 1 month.  She was ordered 1 unit PRBC in the ED for symptomatic anemia.  Her stool was guaiac negative today.    PT Comments    Patient demonstrates good return for bed mobility and transferring to/from chair and tolerated completing BLE ROM/strengthening exercises while seated at EOB.  Patient demonstrates increased endurance  distance for gait training in room/hallway without loss of balance and limited due to c/o fatigue and mild difficulty breathing while on 4 LPM with SpO2 at 96%.  Patient tolerated sitting up in chair after therapy.  Patient will benefit from continued skilled physical therapy in hospital and recommended venue below to increase strength, balance, endurance for safe ADLs and gait.     Recommendations for follow up therapy are one component of a multi-disciplinary discharge planning process, led by the attending physician.  Recommendations may be updated based on patient status, additional functional criteria and insurance authorization.  Follow Up Recommendations       Assistance Recommended at Discharge PRN  Patient can return home with the following A little help with walking and/or transfers;A little help with bathing/dressing/bathroom;Help with stairs or ramp for entrance;Assistance with cooking/housework   Equipment Recommendations  Rolling walker (2 wheels)    Recommendations for Other Services       Precautions / Restrictions Precautions Precautions: Fall Restrictions Weight Bearing Restrictions: No     Mobility  Bed Mobility Overal bed mobility: Modified Independent                  Transfers Overall transfer level: Modified independent                 General transfer comment: slightly labored movement    Ambulation/Gait Ambulation/Gait assistance: Supervision Gait Distance (Feet): 100 Feet Assistive device: Rolling walker (2 wheels) Gait Pattern/deviations: Decreased step length - right, Decreased step length - left, Decreased stride length Gait velocity: decreased     General Gait Details: increased endurance/distance for gait training with slow labored  cadence, occasional standing rest breaks and no loss of balance using RW, on 4 LPM with SpO2 at 96%   Stairs             Wheelchair Mobility    Modified Rankin (Stroke Patients Only)        Balance Overall balance assessment: Needs assistance Sitting-balance support: Feet supported, No upper extremity supported Sitting balance-Leahy Scale: Good Sitting balance - Comments: seated at EOB   Standing balance support: During functional activity, No upper extremity supported Standing balance-Leahy Scale: Fair Standing balance comment: fair/good using RW                            Cognition Arousal/Alertness: Awake/alert Behavior During Therapy: WFL for tasks assessed/performed Overall Cognitive Status: Within Functional Limits for tasks assessed                                          Exercises General Exercises - Lower Extremity Long Arc Quad: Seated, AROM, Strengthening, Both, 10 reps Hip Flexion/Marching: Seated, AROM, Strengthening, Both, 10 reps Toe Raises: Seated, AROM, Strengthening, Both, 10 reps Heel Raises: Seated, AROM, Strengthening, Both, 10 reps    General Comments        Pertinent Vitals/Pain Pain Assessment Pain Assessment: No/denies pain    Home Living                          Prior Function            PT Goals (current goals can now be found in the care plan section) Acute Rehab PT Goals Patient Stated Goal: return home with family to assist PT Goal Formulation: With patient Time For Goal Achievement: 08/29/22 Potential to Achieve Goals: Good Progress towards PT goals: Progressing toward goals    Frequency    Min 3X/week      PT Plan Current plan remains appropriate    Co-evaluation              AM-PAC PT "6 Clicks" Mobility   Outcome Measure  Help needed turning from your back to your side while in a flat bed without using bedrails?: None Help needed moving from lying on your back to sitting on the side of a flat bed without using bedrails?: None Help needed moving to and from a bed to a chair (including a wheelchair)?: None Help needed standing up from a chair using your  arms (e.g., wheelchair or bedside chair)?: None Help needed to walk in hospital room?: A Little Help needed climbing 3-5 steps with a railing? : A Little 6 Click Score: 22    End of Session Equipment Utilized During Treatment: Oxygen Activity Tolerance: Patient tolerated treatment well;Patient limited by fatigue Patient left: in chair;with call bell/phone within reach Nurse Communication: Mobility status PT Visit Diagnosis: Unsteadiness on feet (R26.81);Other abnormalities of gait and mobility (R26.89);Muscle weakness (generalized) (M62.81)     Time: 6440-3474 PT Time Calculation (min) (ACUTE ONLY): 13 min  Charges:  $Therapeutic Activity: 8-22 mins                     1:44 PM, 08/27/22 Ocie Bob, MPT Physical Therapist with [facility] [phone] office [phone] mobile phone

## 2022-08-27 NOTE — TOC Progression Note (Signed)
Transition of Care [facility]) - Progression Note    Patient Details  Name: Courtney Zuniga MRN: [id number] Date of Birth: [birth year]  Transition of Care [facility]) CM/SW Contact  Karn Cassis, [place] Phone Number: 08/27/2022, 2:58 PM  Clinical Narrative: Discussed outpatient PT with pt and pt's son who are now agreeable. Pt's son said he could transport pt and wanted to check with some people on options in [place], but would be open to [facility] if needed as well. Will await return call from son.       Expected Discharge Plan: Home/Self Care Barriers to Discharge: Continued Medical Work up  Expected Discharge Plan and Services In-house Referral: Clinical Social Work     Living arrangements for the past 2 months: Single Family Home                                       Social Determinants of Health (SDOH) Interventions SDOH Screenings   Food Insecurity: No Food Insecurity (08/25/2022)  Housing: Low Risk  (08/25/2022)  Transportation Needs: No Transportation Needs (08/25/2022)  Utilities: Not At Risk (08/25/2022)  Financial Resource Strain: Low Risk  (05/05/2018)  Physical Activity: Insufficiently Active (05/05/2018)  Social Connections: Somewhat Isolated (05/05/2018)  Stress: No Stress Concern Present (05/05/2018)  Tobacco Use: Medium Risk (08/25/2022)    Readmission Risk Interventions    08/26/2022   10:43 AM 05/19/2022   12:12 PM 04/26/2022    1:03 PM  Readmission Risk Prevention Plan  Transportation Screening Complete Complete Complete  HRI or Home Care Consult  Complete Complete  Social Work Consult for Recovery Care Planning/Counseling  Complete Complete  Palliative Care Screening  Not Applicable Not Applicable  Medication Review Oceanographer) Complete Complete Complete  HRI or Home Care Consult Complete    SW Recovery Care/Counseling Consult Complete    Palliative Care Screening Not Applicable    Skilled Nursing Facility Not Applicable

## 2022-08-27 NOTE — Progress Notes (Addendum)
PROGRESS NOTE   Courtney Zuniga  ZOX:[id number] DOB: 02-27-1951 DOA: 08/25/2022 PCP: Oneal Grout, FNP   Chief Complaint  Patient presents with   Shortness of Breath   Level of care: Telemetry  Brief Admission History:  72 year old female with history of recurrent non-small cell lung cancer, chronic shortness of breath, chronic hypoxic respiratory failure, oxygen dependent on 4 L/min, iron deficiency anemia, coronary artery disease with history of NSTEMI, history of gastric ulcers, history of duodenal AVMs, history of GI bleeding, has been followed and treated by Dr. Shirline Frees with oncology and currently under surveillance and chemotherapy has been on hold due to problems with hypotension and anemia.  Patient presented today with vague complaints of shortness of breath for 2 months.  Apparently her shortness of breath has been worse with exertion.  Patient denies having chest pain fever and chills nausea vomiting and diarrhea.  She occasionally has cough but nonproductive.  She was sent for CT of her chest today with findings of moderately sized right pleural effusion.  She has an unchanged right upper lobe spiculated mass measuring 1.8 x 1.3 cm and bilateral lower lobe lung opacities not changed from prior examination and representing metastatic disease.  She was sent for thoracentesis.  Her shortness of breath did not significantly improve after the thoracentesis.  Her exam suggested wheezing and Rales consistent with a COPD exacerbation and admission was requested for further management of this as a cause to her acutely worsening shortness of breath.  She was noted to have a 2 g drop in hemoglobin from 9.2-7.4 in the last 1 month.  She was ordered 1 unit PRBC in the ED for symptomatic anemia.  Her stool was guaiac negative today.   Assessment and Plan:  1)Acute on chronic respiratory failure with hypoxia Multifactorial etiology including COPD exacerbation underlying lung cancer and right-sided  pleural effusion  -Respiratory status improving slowly after thoracic transfusion on 08/25/2022 with removal of 350 mL  2)Advanced stage IV recurrent non-small cell lung cancer--- Recurrent non-small cell lung cancer stage IV- s/p novolumab oncology  - s/p radiation  -- Patient is currently in surveillance by Dr. Shirline Frees and not currently on chemotherapy -- Patient to follow-up with Dr. Arbutus Ped  3)Acute COPD Exac--- continue IV steroids and bronchodilators -c/n IV steroids and bronchodilators -- Continue supplemental oxygen as ordered with goal pulse ox greater than 88% -- s/p thoracentesis of right sided pleural effusion on 5/13 with 350 mL fluid removed -- ambulate with PT today    4)Acute on chronic symptomatic anemia -- Patient has a long history of GI bleeding with AVM and gastric ulcer -- Fortunately patient is stool guaiac was negative on 5/13 -Patient received 1 unit of PRBC on 08/25/2022 for symptomatic anemia -- Pantoprazole ordered for GI protection  5)Severe protein calorie malnutrition -- Regular diet and supplementation ordered for additional calories   6)HTN-- -- Blood pressure well-controlled at this time will follow  7) generalized weakness and deconditioning--- May benefit from outpatient physical therapy or home health physical therapy  DVT prophylaxis: SCDs Code Status: Full  Family Communication: no fam at bedside---Son Lyman Bishop is primary contact Disposition: Status is: Inpatient    Consultants:   Procedures:  Thoracentesis 5/13 right lung pleural effusion  Antimicrobials:    Subjective: No fever  Or chills  No Nausea, Vomiting or Diarrhea Cough, wheezing and shob persist  Objective: Vitals:   08/26/22 2045 08/27/22 0408 08/27/22 0916 08/27/22 1310  BP: 131/76 116/76 108/70 117/76  Pulse:  Marland Kitchen)  103 (!) 103 (!) 109  Resp: 20 19 18 18   Temp: 98.9 F (37.2 C) 99.1 F (37.3 C) 98.5 F (36.9 C) 98.9 F (37.2 C)  TempSrc: Oral Oral Oral Oral   SpO2: 99% 98% 99% 98%  Weight:      Height:        Intake/Output Summary (Last 24 hours) at 08/27/2022 1707 Last data filed at 08/27/2022 1431 Gross per 24 hour  Intake 540 ml  Output 2200 ml  Net -1660 ml   Filed Weights   08/25/22 0925 08/25/22 1639  Weight: 54.3 kg 57.3 kg   Physical Exam Gen:- Awake Alert, in no acute distress , frail and chronically ill-appearing HEENT:- [place].AT, No sclera icterus Nose- [place] 4L/min Neck-Supple Neck,No JVD,.  Lungs-diminished breath sounds, few scattered wheezes bilaterally  CV- S1, S2 normal, RRR, Port-A-Cath in situ Abd-  +ve B.Sounds, Abd Soft, No tenderness,    Extremity/Skin:- No significant edema,   good pedal pulses  Psych-affect is appropriate, oriented x3 Neuro-generalized weakness, no new focal deficits, no tremors   Data Reviewed: I have personally reviewed following labs and imaging studies  CBC: Recent Labs  Lab 08/25/22 1227 08/26/22 0445 08/27/22 0436  WBC 4.7 4.4 5.6  NEUTROABS 4.2  --   --   HGB 7.4* 8.3* 8.4*  HCT 24.6* 27.1* 27.7*  MCV 68.1* 72.1* 72.7*  PLT 326 270 286    Basic Metabolic Panel: Recent Labs  Lab 08/25/22 0923 08/26/22 0445  NA 140 141  K 3.6 3.8  CL 104 102  CO2 28 32  GLUCOSE 89 77  BUN 19 19  CREATININE 0.60 0.58  CALCIUM 8.5* 8.4*  MG  --  1.7   Radiology Studies: No results found.  Scheduled Meds:  B-complex with vitamin C  1 tablet Oral Daily   And   folic acid  1 mg Oral Daily   Chlorhexidine Gluconate Cloth  6 each Topical Daily   DULoxetine  60 mg Oral BID   enoxaparin (LOVENOX) injection  40 mg Subcutaneous Q24H   feeding supplement  237 mL Oral TID BM   fluticasone furoate-vilanterol  1 puff Inhalation Daily   ipratropium-albuterol  3 mL Nebulization BID   megestrol  625 mg Oral Daily   methylPREDNISolone (SOLU-MEDROL) injection  40 mg Intravenous Q12H   pantoprazole  40 mg Oral BID AC   tamsulosin  0.4 mg Oral QPC supper   traZODone  50 mg Oral QHS    Continuous Infusions:   LOS: 2 days   Shon Hale, MD How to contact the [facility] Attending or Consulting provider 7A - 7P or covering provider during after hours 7P -7A, for this patient?  Check the care team in [facility] ([facility]) and look for a) attending/consulting TRH provider listed and b) the [facility] team listed Log into [email or web address] and use [place]'s universal password to access. If you do not have the password, please contact the hospital operator. Locate the [facility] provider you are looking for under [facility] and page to a number that you can be directly reached. If you still have difficulty reaching the provider, please page the [facility] (Director on Call) for the Hospitalists listed on amion for assistance.  08/27/2022, 5:07 PM

## 2022-08-27 NOTE — Care Management Important Message (Signed)
Important Message  Patient Details  Name: Courtney Zuniga MRN: [id number] Date of Birth: 1951/02/03   Medicare Important Message Given:  N/A - LOS <3 / Initial given by admissions     Corey Harold 08/27/2022, 10:16 AM

## 2022-08-27 NOTE — ACP (Advance Care Planning) (Signed)
Met with Courtney Zuniga today and she shared with me that she would like her daughter Courtney Zuniga to be her Health Care Agent. She also shared that if she was found to have a condition that would result in her death within a short period of time that she does not want life prolonging measures.  She would like to wait until her daughter arrives later this evening to fill out the documents and will most probably get this document notarized on an outpatient basis per her request.  Chaplain will remain available in order to provide spiritual support and to assess for spiritual need.   Rev. Jolyn Lent, M.Div.  Chaplain

## 2022-08-27 NOTE — TOC Progression Note (Addendum)
Transition of Care [facility]) - Progression Note    Patient Details  Name: Courtney Zuniga MRN: [id number] Date of Birth: [birth year]  Transition of [facility]) CM/SW Contact  Karn Cassis, [place] Phone Number: 08/27/2022, 8:47 AM  Clinical Narrative:  Pt has been declined by [place], [facility], [place], [facility], [place], [address], [place], [facility] and [facility] agencies. Discussed with pt that [facility] has limited home health options and pt's insurance is also difficult to get home health. LCSW offered to set up outpatient PT, but pt declined. TOC will continue to work on home health, but pt understands that an agency may not accept.      Expected Discharge Plan: Home/Self Care Barriers to Discharge: Continued Medical Work up  Expected Discharge Plan and Services In-house Referral: Clinical Social Work     Living arrangements for the past 2 months: Single Family Home                                       Social Determinants of Health (SDOH) Interventions SDOH Screenings   Food Insecurity: No Food Insecurity (08/25/2022)  Housing: Low Risk  (08/25/2022)  Transportation Needs: No Transportation Needs (08/25/2022)  Utilities: Not At Risk (08/25/2022)  Financial Resource Strain: Low Risk  (05/05/2018)  Physical Activity: Insufficiently Active (05/05/2018)  Social Connections: Somewhat Isolated (05/05/2018)  Stress: No Stress Concern Present (05/05/2018)  Tobacco Use: Medium Risk (08/25/2022)    Readmission Risk Interventions    08/26/2022   10:43 AM 05/19/2022   12:12 PM 04/26/2022    1:03 PM  Readmission Risk Prevention Plan  Transportation Screening Complete Complete Complete  HRI or Home Care Consult  Complete Complete  Social Work Consult for Recovery Care Planning/Counseling  Complete Complete  Palliative Care Screening  Not Applicable Not Applicable  Medication Review Ocean[facility]) Complete Complete Complete  HRI or Home Care Consult Complete     SW Recovery Care/Counseling Consult Complete    Palliative Care Screening Not Applicable    Skilled Nursing Facility Not Applicable

## 2022-08-28 ENCOUNTER — Inpatient Hospital Stay (HOSPITAL_COMMUNITY): Payer: 59

## 2022-08-28 DIAGNOSIS — J9621 Acute and chronic respiratory failure with hypoxia: Secondary | ICD-10-CM | POA: Diagnosis not present

## 2022-08-28 LAB — CBC
HCT: 28 % — ABNORMAL LOW (ref 36.0–46.0)
Hemoglobin: 8.5 g/dL — ABNORMAL LOW (ref 12.0–15.0)
MCH: 21.5 pg — ABNORMAL LOW (ref 26.0–34.0)
MCHC: 30.4 g/dL (ref 30.0–36.0)
MCV: 70.7 fL — ABNORMAL LOW (ref 80.0–100.0)
Platelets: 283 10*3/uL (ref 150–400)
RBC: 3.96 MIL/uL (ref 3.87–5.11)
RDW: 24.2 % — ABNORMAL HIGH (ref 11.5–15.5)
WBC: 7.1 10*3/uL (ref 4.0–10.5)
nRBC: 0 % (ref 0.0–0.2)

## 2022-08-28 LAB — BPAM RBC

## 2022-08-28 LAB — TYPE AND SCREEN

## 2022-08-28 MED ORDER — DOXYCYCLINE HYCLATE 100 MG PO TABS: 100.0000 mg | ORAL_TABLET | Freq: Two times a day (BID) | ORAL | 0 refills | Status: AC

## 2022-08-28 MED ORDER — TAMSULOSIN HCL 0.4 MG PO CAPS: 0.4000 mg | ORAL_CAPSULE | Freq: Every day | ORAL | 3 refills | Status: DC

## 2022-08-28 MED ORDER — COMBIVENT RESPIMAT 20-100 MCG/ACT IN AERS: 1.0000 | INHALATION_SPRAY | Freq: Four times a day (QID) | RESPIRATORY_TRACT | 3 refills | Status: DC | PRN

## 2022-08-28 MED ORDER — TRELEGY ELLIPTA 100-62.5-25 MCG/ACT IN AEPB: 1.0000 | INHALATION_SPRAY | Freq: Every day | RESPIRATORY_TRACT | 3 refills | Status: DC

## 2022-08-28 MED ORDER — TRAZODONE HCL 50 MG PO TABS: 50.0000 mg | ORAL_TABLET | Freq: Every day | ORAL | 5 refills | Status: AC

## 2022-08-28 MED ORDER — MAGNESIUM OXIDE -MG SUPPLEMENT 400 (240 MG) MG PO TABS
800.0000 mg | ORAL_TABLET | Freq: Once | ORAL | Status: AC
  Administered 2022-08-28: 800 mg via ORAL
  Filled 2022-08-28: qty 2

## 2022-08-28 MED ORDER — DULOXETINE HCL 60 MG PO CPEP: 60.0000 mg | ORAL_CAPSULE | Freq: Two times a day (BID) | ORAL | 3 refills | Status: DC

## 2022-08-28 MED ORDER — GUAIFENESIN ER 600 MG PO TB12: 600.0000 mg | ORAL_TABLET | Freq: Two times a day (BID) | ORAL | 2 refills | Status: DC

## 2022-08-28 MED ORDER — VITAMIN D (ERGOCALCIFEROL) 1.25 MG (50000 UNIT) PO CAPS: 50000.0000 [IU] | ORAL_CAPSULE | ORAL | 3 refills | Status: DC

## 2022-08-28 MED ORDER — PANTOPRAZOLE SODIUM 40 MG PO TBEC: 40.0000 mg | DELAYED_RELEASE_TABLET | Freq: Two times a day (BID) | ORAL | 11 refills | Status: DC

## 2022-08-28 MED ORDER — HEPARIN SOD (PORK) LOCK FLUSH 100 UNIT/ML IV SOLN
500.0000 [IU] | INTRAVENOUS | Status: AC | PRN
  Administered 2022-08-28: 500 [IU]
  Filled 2022-08-28: qty 5

## 2022-08-28 MED ORDER — INTEGRA PLUS PO CAPS: 1.0000 | ORAL_CAPSULE | Freq: Every morning | ORAL | 5 refills | Status: DC

## 2022-08-28 MED ORDER — FLUTICASONE FUROATE-VILANTEROL 100-25 MCG/ACT IN AEPB: 1.0000 | INHALATION_SPRAY | Freq: Every day | RESPIRATORY_TRACT | 5 refills | Status: DC

## 2022-08-28 MED ORDER — MEGESTROL ACETATE 625 MG/5ML PO SUSP
625.0000 mg | Freq: Every day | ORAL | 3 refills | Status: DC
Start: 2022-08-28 — End: 2023-02-23

## 2022-08-28 MED ORDER — ALBUTEROL SULFATE HFA 108 (90 BASE) MCG/ACT IN AERS: 2.0000 | INHALATION_SPRAY | RESPIRATORY_TRACT | 3 refills | Status: DC | PRN

## 2022-08-28 MED ORDER — ALBUTEROL SULFATE (2.5 MG/3ML) 0.083% IN NEBU: 2.5000 mg | INHALATION_SOLUTION | Freq: Three times a day (TID) | RESPIRATORY_TRACT | 12 refills | Status: DC | PRN

## 2022-08-28 MED ORDER — TOPIRAMATE 25 MG PO TABS: 25.0000 mg | ORAL_TABLET | Freq: Two times a day (BID) | ORAL | 2 refills | Status: DC

## 2022-08-28 MED ORDER — PREDNISONE 20 MG PO TABS: 40.0000 mg | ORAL_TABLET | Freq: Every day | ORAL | 0 refills | Status: AC

## 2022-08-28 MED ORDER — MULTI-VITAMIN/MINERALS PO TABS: 1.0000 | ORAL_TABLET | Freq: Every day | ORAL | 2 refills | Status: DC

## 2022-08-28 MED ORDER — PROCHLORPERAZINE MALEATE 10 MG PO TABS: 10.0000 mg | ORAL_TABLET | Freq: Four times a day (QID) | ORAL | 1 refills | Status: DC | PRN

## 2022-08-28 NOTE — Progress Notes (Signed)
Physical Therapy Treatment Patient Details Name: Courtney Zuniga MRN: [id number] DOB: 09-Mar-1951 Today's Date: 08/28/2022   History of Present Illness Courtney Zuniga is a 72 year old female with history of recurrent non-small cell lung cancer, chronic shortness of breath, chronic hypoxic respiratory failure, oxygen dependent on 4 L/min, iron deficiency anemia, coronary artery disease with history of NSTEMI, history of gastric ulcers, history of duodenal AVMs, history of GI bleeding, has been followed and treated by Dr. Shirline Zuniga with oncology and currently under surveillance and chemotherapy has been on hold due to problems with hypotension and anemia.     Patient presented today with vague complaints of shortness of breath for 2 months.  Apparently her shortness of breath has been worse with exertion.  Patient denies having chest pain fever and chills nausea vomiting and diarrhea.  She occasionally has cough but nonproductive.  She was sent for CT of her chest today with findings of moderately sized right pleural effusion.  She has an unchanged right upper lobe spiculated mass measuring 1.8 x 1.3 cm and bilateral lower lobe lung opacities not changed from prior examination and representing metastatic disease.  She was sent for thoracentesis.  Her shortness of breath did not significantly improve after the thoracentesis.  Her exam suggested wheezing and Rales consistent with a COPD exacerbation and admission was requested for further management of this as a cause to her acutely worsening shortness of breath.  She was noted to have a 2 g drop in hemoglobin from 9.2-7.4 in the last 1 month.  She was ordered 1 unit PRBC in the ED for symptomatic anemia.  Her stool was guaiac negative today.    PT Comments    Patient required verbal cues for proper hand placement using RW for sit to stands and demonstrated good carryover, had increased endurance/distance for gait training without loss of balance while on 4 LPM  O2 with SpO2 at 89-96% and increased HR up to 125 BPM.  Patient tolerated sitting up in chair after therapy with SpO2 at 96%.  Patient will benefit from continued skilled physical therapy in hospital and recommended venue below to increase strength, balance, endurance for safe ADLs and gait.     Recommendations for follow up therapy are one component of a multi-disciplinary discharge planning process, led by the attending physician.  Recommendations may be updated based on patient status, additional functional criteria and insurance authorization.  Follow Up Recommendations       Assistance Recommended at Discharge PRN  Patient can return home with the following A little help with walking and/or transfers;A little help with bathing/dressing/bathroom;Help with stairs or ramp for entrance;Assistance with cooking/housework   Equipment Recommendations  Rolling walker (2 wheels)    Recommendations for Other Services       Precautions / Restrictions Precautions Precautions: Fall Restrictions Weight Bearing Restrictions: No     Mobility  Bed Mobility Overal bed mobility: Independent                  Transfers Overall transfer level: Modified independent                 General transfer comment: required verbal cues for proper hand placement during sit to stands using RW with good carryover demonstrated    Ambulation/Gait Ambulation/Gait assistance: Supervision, Modified independent (Device/Increase time) Gait Distance (Feet): 120 Feet Assistive device: Rolling walker (2 wheels) Gait Pattern/deviations: Decreased step length - right, Decreased step length - left, Decreased stride length Gait velocity: decreased  General Gait Details: slightly labored cadence with increased endurance/distance for ambulation without loss of balance, on 4 LPM O2 with SpO2 at 89-96%, HR increasing to 124   Stairs             Wheelchair Mobility    Modified Rankin (Stroke  Patients Only)       Balance Overall balance assessment: Needs assistance Sitting-balance support: Feet supported, No upper extremity supported Sitting balance-Leahy Scale: Good Sitting balance - Comments: seated at EOB   Standing balance support: During functional activity, No upper extremity supported Standing balance-Leahy Scale: Fair Standing balance comment: good using RW                            Cognition Arousal/Alertness: Awake/alert Behavior During Therapy: WFL for tasks assessed/performed Overall Cognitive Status: Within Functional Limits for tasks assessed                                          Exercises General Exercises - Lower Extremity Long Arc Quad: Seated, AROM, Strengthening, Both, 10 reps Hip Flexion/Marching: Seated, AROM, Strengthening, Both, 10 reps Toe Raises: Seated, AROM, Strengthening, Both, 10 reps Heel Raises: Seated, AROM, Strengthening, Both, 10 reps    General Comments        Pertinent Vitals/Pain Pain Assessment Pain Assessment: No/denies pain    Home Living                          Prior Function            PT Goals (current goals can now be found in the care plan section) Acute Rehab PT Goals Patient Stated Goal: return home with family to assist PT Goal Formulation: With patient Time For Goal Achievement: 08/29/22 Potential to Achieve Goals: Good Progress towards PT goals: Progressing toward goals    Frequency    Min 3X/week      PT Plan Current plan remains appropriate    Co-evaluation              AM-PAC PT "6 Clicks" Mobility   Outcome Measure  Help needed turning from your back to your side while in a flat bed without using bedrails?: None Help needed moving from lying on your back to sitting on the side of a flat bed without using bedrails?: None Help needed moving to and from a bed to a chair (including a wheelchair)?: None Help needed standing up from a chair  using your arms (e.g., wheelchair or bedside chair)?: None Help needed to walk in hospital room?: A Little Help needed climbing 3-5 steps with a railing? : A Little 6 Click Score: 22    End of Session Equipment Utilized During Treatment: Oxygen Activity Tolerance: Patient tolerated treatment well;Patient limited by fatigue Patient left: in chair;with call bell/phone within reach Nurse Communication: Mobility status PT Visit Diagnosis: Unsteadiness on feet (R26.81);Other abnormalities of gait and mobility (R26.89);Muscle weakness (generalized) (M62.81)     Time: 1660-6301 PT Time Calculation (min) (ACUTE ONLY): 26 min  Charges:  $Gait Training: 8-22 mins $Therapeutic Exercise: 8-22 mins                     9:49 AM, 08/28/22 Ocie Bob, MPT Physical Therapist with [facility] [phone] office [phone] mobile phone

## 2022-08-28 NOTE — Care Management Important Message (Signed)
Important Message  Patient Details  Name: Courtney Zuniga MRN: [id number] Date of Birth: 22-May-1950   Medicare Important Message Given:  Yes     Corey Harold 08/28/2022, 10:45 AM

## 2022-08-28 NOTE — Discharge Instructions (Signed)
1)Please Note that there has been changes to your medications-- 2)You need oxygen at home at 4 L via nasal cannula continuously while awake and while asleep--- smoking or having open fires around oxygen can cause fire, significant injury and death 3)follow up with Pernell Dupre Cephas Darby, FNP (Primary Care Provider) within 1 week 4)Repeat CBC within 1 week

## 2022-08-28 NOTE — Discharge Summary (Signed)
Courtney Zuniga, is a 72 y.o. female  DOB 11-24-1950  MRN [id number].  Admission date:  08/25/2022  Admitting Physician  Cleora Fleet, MD  Discharge Date:  08/28/2022   Primary MD  Oneal Grout, FNP  Recommendations for primary care physician for things to follow:   1)Please Note that there has been changes to your medications-- 2)You need oxygen at home at 4 L via nasal cannula continuously while awake and while asleep--- smoking or having open fires around oxygen can cause fire, significant injury and death 3)follow up with Pernell Dupre, Cephas Darby, FNP (Primary Care Provider) within 1 week 4)Repeat CBC within 1 week  Admission Diagnosis  Pleural effusion [J90] COPD exacerbation (HCC) [J44.1] Acute respiratory failure with hypoxia (HCC) [J96.01] Non-small cell lung cancer, unspecified laterality (HCC) [C34.90] Acute on chronic respiratory failure with hypoxia (HCC) [J96.21]   Discharge Diagnosis  Pleural effusion [J90] COPD exacerbation (HCC) [J44.1] Acute respiratory failure with hypoxia (HCC) [J96.01] Non-small cell lung cancer, unspecified laterality (HCC) [C34.90] Acute on chronic respiratory failure with hypoxia (HCC) [J96.21]    Principal Problem:   Acute on chronic respiratory failure with hypoxia (HCC) Active Problems:   Acute blood loss anemia   HTN (hypertension)   Asthma, chronic   COPD with acute exacerbation (HCC)   Lung mass   Severe protein-calorie malnutrition (HCC)   GERD (gastroesophageal reflux disease)   Port-A-Cath in place   Acute on chronic anemia   Gastric ulcer   AVM (arteriovenous malformation) of small bowel, acquired   Iron deficiency anemia   Pleural effusion      Past Medical History:  Diagnosis Date   Anemia    as a young woman   Arthritis    osteoartritis   Asthma    Brain tumor (benign) (HCC) 2005 [facility]   Benign   Chronic headaches    Chronic  hip pain    Chronic pain    COPD (chronic obstructive pulmonary disease) (HCC)    Coronary artery disease    Depression    Depression 05/15/2016   Encounter for antineoplastic chemotherapy 01/10/2016   GERD (gastroesophageal reflux disease)    Hypertension    Lung cancer (HCC) dx'd 01/2016   currently on chemo and radiation    NSTEMI (non-ST elevated myocardial infarction) (HCC) yrs ago   On home O2    qhs 2 liters at hs and prn   Pneumonia last time 2 yrs ago   Shortness of breath dyspnea    with activity    Past Surgical History:  Procedure Laterality Date   CHOLECYSTECTOMY     COLONOSCOPY  2015   Results requested from [facility]   COLONOSCOPY     ESOPHAGOGASTRODUODENOSCOPY N/A 08/14/2015   Procedure: ESOPHAGOGASTRODUODENOSCOPY (EGD);  Surgeon: Corbin Ade, MD;  Location: [facility];  Service: Endoscopy;  Laterality: N/A;  215    ESOPHAGOGASTRODUODENOSCOPY (EGD) WITH PROPOFOL N/A 09/13/2015   Procedure: ESOPHAGOGASTRODUODENOSCOPY (EGD) WITH PROPOFOL;  Surgeon: Rachael Fee, MD;  Location: [facility];  Service: Endoscopy;  Laterality: N/A;   ESOPHAGOGASTRODUODENOSCOPY (EGD) WITH PROPOFOL N/A 05/19/2022   Procedure: ESOPHAGOGASTRODUODENOSCOPY (EGD) WITH PROPOFOL;  Surgeon: Lanelle Bal, DO;  Location: [facility];  Service: Endoscopy;  Laterality: N/A;   EUS N/A 03/12/2017   Procedure: UPPER ENDOSCOPIC ULTRASOUND (EUS) RADIAL;  Surgeon: Rachael Fee, MD;  Location: [facility];  Service: Endoscopy;  Laterality: N/A;   HOT HEMOSTASIS  05/19/2022   Procedure: HOT HEMOSTASIS (ARGON PLASMA COAGULATION/BICAP);  Surgeon: Lanelle Bal, DO;  Location: [facility];  Service: Endoscopy;;   IR IMAGING GUIDED PORT INSERTION  01/20/2022   TUMOR REMOVAL  2005   Benign   UPPER ESOPHAGEAL ENDOSCOPIC ULTRASOUND (EUS)  09/13/2015   Procedure: UPPER ESOPHAGEAL ENDOSCOPIC ULTRASOUND (EUS);  Surgeon: Rachael Fee, MD;  Location: [facility];  Service: Endoscopy;;    VIDEO BRONCHOSCOPY WITH ENDOBRONCHIAL NAVIGATION N/A 12/31/2015   Procedure: VIDEO BRONCHOSCOPY WITH ENDOBRONCHIAL NAVIGATION;  Surgeon: Loreli Slot, MD;  Location: [facility];  Service: Thoracic;  Laterality: N/A;   VIDEO BRONCHOSCOPY WITH ENDOBRONCHIAL ULTRASOUND N/A 11/08/2015   Procedure: VIDEO BRONCHOSCOPY WITH ENDOBRONCHIAL ULTRASOUND;  Surgeon: Kerin Perna, MD;  Location: [facility] OR;  Service: Thoracic;  Laterality: N/A;     HPI  from the history and physical done on the day of admission:    Chief Complaint: SOB   HPI: Courtney Zuniga is a 72 year old female with history of recurrent non-small cell lung cancer, chronic shortness of breath, chronic hypoxic respiratory failure, oxygen dependent on 4 L/min, iron deficiency anemia, coronary artery disease with history of NSTEMI, history of gastric ulcers, history of duodenal AVMs, history of GI bleeding, has been followed and treated by Dr. Shirline Frees with oncology and currently under surveillance and chemotherapy has been on hold due to problems with hypotension and anemia.   Patient presented today with vague complaints of shortness of breath for 2 months.  Apparently her shortness of breath has been worse with exertion.  Patient denies having chest pain fever and chills nausea vomiting and diarrhea.  She occasionally has cough but nonproductive.  She was sent for CT of her chest today with findings of moderately sized right pleural effusion.  She has an unchanged right upper lobe spiculated mass measuring 1.8 x 1.3 cm and bilateral lower lobe lung opacities not changed from prior examination and representing metastatic disease.  She was sent for thoracentesis.  Her shortness of breath did not significantly improve after the thoracentesis.  Her exam suggested wheezing and Rales consistent with a COPD exacerbation and admission was requested for further management of this as a cause to her acutely worsening shortness of breath.  She was noted to have  a 2 g drop in hemoglobin from 9.2-7.4 in the last 1 month.  She was ordered 1 unit PRBC in the ED for symptomatic anemia.  Her stool was guaiac negative today.    Hospital Course:    1)Acute on chronic respiratory failure with hypoxia Multifactorial etiology including COPD exacerbation underlying lung cancer and right-sided pleural effusion  -Respiratory status improving slowly after thoracentensis on 08/25/2022 with removal of 350 mL -Respiratory status has improved and oxygen requirement is back to baseline of 3.5 to 4 L   2)Advanced stage IV recurrent non-small cell lung cancer--- Recurrent non-small cell lung cancer stage IV- s/p novolumab oncology  - s/p radiation  -- Patient is currently in surveillance by Dr. Shirline Frees and not currently on chemotherapy -- Patient to follow-up with Dr. Arbutus Ped   3)Acute  COPD Exac---  -Treated with iV steroids and bronchodilators -- Continue supplemental oxygen as ordered with goal pulse ox greater than 88% -- s/p thoracentesis of right sided pleural effusion on 5/13 with 350 mL fluid removed -Okay to discharge on p.o. prednisone and doxycycline   4)Acute on chronic symptomatic anemia -- Patient has a long history of GI bleeding with AVM and gastric ulcer -- Fortunately patient is stool guaiac was negative on 5/13 -Patient received 1 unit of PRBC on 08/25/2022 for symptomatic anemia -Stable   5)Severe protein calorie malnutrition -- Regular diet and supplementation ordered for additional calories   6)HTN-- -- Blood pressure well-controlled at this time will follow   7)Generalized weakness and deconditioning--- May benefit from outpatient physical therapy or home health physical therapy  Family Communication: no fam at bedside---Son Lyman Bishop is primary contact  Procedures:  Thoracentesis 5/13 right lung pleural effusion  Discharge Condition: Stable  Follow UP   Follow-up Information     Oneal Grout, FNP. Schedule an appointment as  soon as possible for a visit in 1 week(s).   Specialty: Family Medicine Why: Repeat CBC Contact information: [address] [place] [address] [phone]                Diet and Activity recommendation:  As advised  Discharge Instructions    Discharge Instructions     Call MD for:  difficulty breathing, headache or visual disturbances   Complete by: As directed    Call MD for:  persistant dizziness or light-headedness   Complete by: As directed    Call MD for:  persistant nausea and vomiting   Complete by: As directed    Call MD for:  severe uncontrolled pain   Complete by: As directed    Call MD for:  temperature >100.4   Complete by: As directed    Diet - low sodium heart healthy   Complete by: As directed    Discharge instructions   Complete by: As directed    1)Please Note that there has been changes to your medications-- 2)You need oxygen at home at 4 L via nasal cannula continuously while awake and while asleep--- smoking or having open fires around oxygen can cause fire, significant injury and death 3)follow up with Oneal Grout, FNP (Primary Care Provider) within 1 week 4)Repeat CBC within 1 week   Increase activity slowly   Complete by: As directed         Discharge Medications     Allergies as of 08/28/2022       Reactions   Desyrel [trazodone]    "Made me faint"        Medication List     STOP taking these medications    fluticasone furoate-vilanterol 200-25 MCG/ACT Aepb Commonly known as: BREO ELLIPTA Replaced by: fluticasone furoate-vilanterol 100-25 MCG/ACT Aepb       TAKE these medications    acetaminophen 325 MG tablet Commonly known as: TYLENOL Take 2 tablets (650 mg total) by mouth every 6 (six) hours as needed for mild pain, fever or headache.   albuterol 108 (90 Base) MCG/ACT inhaler Commonly known as: VENTOLIN HFA Inhale 2 puffs into the lungs every 4 (four) hours as needed for shortness of breath.   albuterol  (2.5 MG/3ML) 0.083% nebulizer solution Commonly known as: PROVENTIL Take 3 mLs (2.5 mg total) by nebulization 3 (three) times daily as needed for shortness of breath or wheezing.   Combivent Respimat 20-100 MCG/ACT Aers respimat Generic drug:  Ipratropium-Albuterol Inhale 1 puff into the lungs every 6 (six) hours as needed for wheezing or shortness of breath.   doxycycline 100 MG tablet Commonly known as: VIBRA-TABS Take 1 tablet (100 mg total) by mouth 2 (two) times daily for 5 days.   DULoxetine 60 MG capsule Commonly known as: CYMBALTA Take 1 capsule (60 mg total) by mouth 2 (two) times daily.   Ensure Take 237 mLs by mouth 3 (three) times daily between meals.   fluticasone furoate-vilanterol 100-25 MCG/ACT Aepb Commonly known as: Breo Ellipta Inhale 1 puff into the lungs daily. Replaces: fluticasone furoate-vilanterol 200-25 MCG/ACT Aepb   guaiFENesin 600 MG 12 hr tablet Commonly known as: Mucinex Take 1 tablet (600 mg total) by mouth 2 (two) times daily.   Integra Plus Caps Take 1 capsule by mouth every morning.   megestrol 625 MG/5ML suspension Commonly known as: Megace ES Take 5 mLs (625 mg total) by mouth daily. For appetite stimulation What changed: additional instructions   multivitamin with minerals tablet Take 1 tablet by mouth daily.   oxyCODONE-acetaminophen 5-325 MG tablet Commonly known as: PERCOCET/ROXICET Take 1 tablet by mouth every 8 (eight) hours as needed for moderate pain.   pantoprazole 40 MG tablet Commonly known as: PROTONIX Take 1 tablet (40 mg total) by mouth 2 (two) times daily.   predniSONE 20 MG tablet Commonly known as: DELTASONE Take 2 tablets (40 mg total) by mouth daily with breakfast for 5 days.   prochlorperazine 10 MG tablet Commonly known as: COMPAZINE Take 1 tablet (10 mg total) by mouth every 6 (six) hours as needed for nausea or vomiting.   tamsulosin 0.4 MG Caps capsule Commonly known as: FLOMAX Take 1 capsule (0.4 mg  total) by mouth daily after supper.   topiramate 25 MG tablet Commonly known as: Topamax Take 1 tablet (25 mg total) by mouth 2 (two) times daily. What changed:  when to take this reasons to take this   traZODone 50 MG tablet Commonly known as: DESYREL Take 1 tablet (50 mg total) by mouth at bedtime.   Trelegy Ellipta 100-62.5-25 MCG/ACT Aepb Generic drug: Fluticasone-Umeclidin-Vilant Inhale 1 puff into the lungs daily.   Vitamin D (Ergocalciferol) 1.25 MG (50000 UNIT) Caps capsule Commonly known as: DRISDOL Take 1 capsule (50,000 Units total) by mouth once a week.               Durable Medical Equipment  (From admission, onward)           Start     Ordered   08/26/22 1559  For home use only DME Walker rolling  Once       Comments: Patient at risk for falls without AD due to generalized weakness, safer using RW with good return for use demonstrated  Question Answer Comment  Walker: With 5 Inch Wheels   Patient needs a walker to treat with the following condition Gait difficulty      08/26/22 1559   08/26/22 1535  For home use only DME Walker rolling  Once       Question Answer Comment  Walker: With 5 Inch Wheels   Patient needs a walker to treat with the following condition Gait instability      08/26/22 1534           Major procedures and Radiology Reports - PLEASE review detailed and final reports for all details, in brief -   DG CHEST PORT 1 VIEW  Result Date: 08/28/2022 CLINICAL DATA:  [id number] Dyspnea  and respiratory abnormalities [id number] EXAM: PORTABLE CHEST 1 VIEW COMPARISON:  Radiograph 08/25/2022 FINDINGS: Chest port catheter tip overlies the superior cavoatrial junction. Unchanged cardiomediastinal silhouette. Unchanged spiculated right upper lobe nodule and right perihilar opacity. Unchanged nodular left basilar opacity and right medial basilar opacities. Unchanged small right pleural effusion and basilar atelectasis. Emphysema with bilateral lung  scarring. No new airspace disease. Skin fold overlies the right upper chest, no definite pneumothorax. Bones are unchanged. IMPRESSION: Unchanged small right pleural effusion and bibasilar atelectasis. Unchanged right perihilar opacities and additional nodular opacities. Emphysema with lung scarring. Electronically Signed   By: Caprice Renshaw M.D.   On: 08/28/2022 08:30   US THORACENTESIS ASP PLEURAL SPACE W/IMG GUIDE  Result Date: 08/25/2022 INDICATION: Patient with a history of lung cancer presents today with a right pleural effusion. Interventional Radiology asked to perform a diagnostic and therapeutic thoracentesis. EXAM: ULTRASOUND GUIDED THORACENTESIS MEDICATIONS: 1% lidocaine 10 ml COMPLICATIONS: None immediate. PROCEDURE: An ultrasound guided thoracentesis was thoroughly discussed with the patient and questions answered. The benefits, risks, alternatives and complications were also discussed. The patient understands and wishes to proceed with the procedure. Written consent was obtained. Ultrasound was performed to localize and mark an adequate pocket of fluid in the right chest. The area was then prepped and draped in the normal sterile fashion. 1% Lidocaine was used for local anesthesia. Under ultrasound guidance a 6 Fr Safe-T-Centesis catheter was introduced. Thoracentesis was performed. The catheter was removed and a dressing applied. FINDINGS: A total of approximately 350 ml of amber-colored fluid was removed. Samples were sent to the laboratory as requested by the clinical team. IMPRESSION: Successful ultrasound guided right thoracentesis yielding 350 ml of pleural fluid. Procedure performed by: Alwyn Ren, NP Electronically Signed   By: Olive Bass M.D.   On: 08/25/2022 15:07   DG Chest 1 View  Result Date: 08/25/2022 CLINICAL DATA:  Post thoracentesis. EXAM: CHEST  1 VIEW COMPARISON:  08/25/2022 at 9:28 a.m. FINDINGS: Right-sided Port-A-Cath unchanged. Lungs are adequately inflated  demonstrate improvement in patients right-sided pleural effusion post thoracentesis. Small amount of residual right pleural fluid with atelectasis is present in the right base. No definite pneumothorax. Remainder of the exam is unchanged. IMPRESSION: Improvement in patients right-sided pleural effusion post thoracentesis. Small amount of residual right pleural fluid with atelectasis in the right base. No pneumothorax. Electronically Signed   By: Elberta Fortis M.D.   On: 08/25/2022 14:11   CT Angio Chest PE W and/or Wo Contrast  Result Date: 08/25/2022 CLINICAL DATA:  Increased shortness of breath for over 2 months. Patient wears 4 L of continuous oxygen cannula at home due to lung cancer. EXAM: CT ANGIOGRAPHY CHEST WITH CONTRAST TECHNIQUE: Multidetector CT imaging of the chest was performed using the standard protocol during bolus administration of intravenous contrast. Multiplanar CT image reconstructions and MIPs were obtained to evaluate the vascular anatomy. RADIATION DOSE REDUCTION: This exam was performed according to the departmental dose-optimization program which includes automated exposure control, adjustment of the mA and/or kV according to patient size and/or use of iterative reconstruction technique. CONTRAST:  75mL OMNIPAQUE IOHEXOL 350 MG/ML SOLN COMPARISON:  CT examination dated June 25, 2018 FINDINGS: Cardiovascular: Satisfactory opacification of the pulmonary arteries to the segmental level. No evidence of pulmonary embolism. Normal heart size. No pericardial effusion. Main pulmonary trunk is dilated measuring up to 3.5 cm concerning for pulmonary arterial hypertension. Right IJ access MediPort with distal tip in the SVC. Mediastinum/Nodes: No enlarged mediastinal, hilar, or axillary lymph  nodes. Thyroid gland, trachea, and esophagus demonstrate no significant findings. Lungs/Pleura: Emphysematous changes with biapical pleural/parenchymal scarring. Right upper lobe spiculated mass measuring a  proximally 1.8 x 1.3 cm, not significantly changed. Interval development of moderate-sized right pleural effusion. Bilateral lower lobe lung opacities, the left lower lobe opacity measuring 13 x 11 mm and partially imaged right lower lobe opacity measuring 8 x 17 mm. These are not significantly changed from prior examination and likely represent metastatic disease. Upper Abdomen: Cholecystectomy changes.  No acute abnormality. Musculoskeletal: No chest wall abnormality. No acute or significant osseous findings. T12 vertebral body hemangioma. Review of the MIP images confirms the above findings. IMPRESSION: 1. No evidence of pulmonary embolism. 2. Interval development of moderate-sized right pleural effusion. 3. Right upper lobe spiculated mass measuring 1.8 x 1.3 cm, not significantly changed from prior examination. 4. Bilateral lower lobe lung opacities, not significantly changed from prior examination and likely represent metastatic disease. 5. Dilated main pulmonary trunk measuring up to 3.5 cm concerning for pulmonary arterial hypertension. Aortic Atherosclerosis (ICD10-I70.0) and Emphysema (ICD10-J43.9). Electronically Signed   By: Larose Hires D.O.   On: 08/25/2022 12:03   DG Chest Port 1 View  Result Date: 08/25/2022 CLINICAL DATA:  Shortness of breath. EXAM: PORTABLE CHEST 1 VIEW COMPARISON:  CT chest dated June 25, 2022 FINDINGS: The heart size and mediastinal contours are within normal limits. Hyperinflated lungs with emphysematous changes of the bilateral upper lobes. Bibasilar scarring/atelectasis, not significantly changed. Right IJ access MediPort with distal tip in the SVC. No acute osseous abnormality. IMPRESSION: 1. Hyperinflated lungs with emphysematous changes of the bilateral upper lobes, unchanged. 2. Bibasilar scarring/atelectasis, not significantly changed. No evidence of acute pneumonia or large pleural effusion. Electronically Signed   By: Larose Hires D.O.   On: 08/25/2022 09:49     Today   Subjective    Dorinne Hase today has no new complaints -Ambulated with physical therapist -Respiratory status and oxygen requirement back to baseline No fever  Or chills   No Nausea, Vomiting or Diarrhea         Patient has been seen and examined prior to discharge   Objective   Blood pressure 115/67, pulse (!) 110, temperature 98 F (36.7 C), resp. rate 16, height 5\' 6"  (1.676 m), weight 57.3 kg, SpO2 95 %.   Intake/Output Summary (Last 24 hours) at 08/28/2022 1541 Last data filed at 08/28/2022 1528 Gross per 24 hour  Intake 477 ml  Output 3200 ml  Net -2723 ml    Exam Gen:- Awake Alert, no acute distress, no conversational dyspnea, patient appears frail and chronically ill-appearing HEENT:- [place].AT, No sclera icterus Nose- [place] 4L/min Neck-Supple Neck,No JVD,.  Lungs-  CTAB , good air movement bilaterally CV- S1, S2 normal, regular, right-sided Port-A-Cath in situ Abd-  +ve B.Sounds, Abd Soft, No tenderness,    Extremity/Skin:- No  edema,   good pulses Psych-affect is appropriate, oriented x3 Neuro-generalized weakness, no new focal deficits, no tremors    Data Review   CBC w Diff:  Lab Results  Component Value Date   WBC 7.1 08/28/2022   HGB 8.5 (L) 08/28/2022   HGB 9.2 (L) 07/01/2022   HGB 14.3 04/09/2017   HCT 28.0 (L) 08/28/2022   HCT 42.7 04/09/2017   PLT 283 08/28/2022   PLT 423 (H) 07/01/2022   PLT 321 04/09/2017   LYMPHOPCT 8 08/25/2022   LYMPHOPCT 22.5 04/09/2017   MONOPCT 1 08/25/2022   MONOPCT 7.0 04/09/2017   EOSPCT 0 08/25/2022  EOSPCT 1.3 04/09/2017   BASOPCT 0 08/25/2022   BASOPCT 0.8 04/09/2017   CMP:  Lab Results  Component Value Date   NA 141 08/26/2022   NA 144 04/09/2017   K 3.8 08/26/2022   K 4.3 04/09/2017   CL 102 08/26/2022   CO2 32 08/26/2022   CO2 25 04/09/2017   BUN 19 08/26/2022   BUN 16.8 04/09/2017   CREATININE 0.58 08/26/2022   CREATININE 0.58 07/01/2022   CREATININE 0.8 04/09/2017   PROT 6.5  07/16/2022   PROT 6.8 04/09/2017   ALBUMIN 3.5 07/16/2022   ALBUMIN 3.5 04/09/2017   BILITOT 0.4 07/16/2022   BILITOT 0.3 07/01/2022   BILITOT 0.22 04/09/2017   ALKPHOS 222 (H) 07/16/2022   ALKPHOS 216 (H) 04/09/2017   AST 16 07/16/2022   AST 15 07/01/2022   AST 12 04/09/2017   ALT 10 07/16/2022   ALT 8 07/01/2022   ALT 13 04/09/2017   Total Discharge time is about 33 minutes  Shon Hale M.D on 08/28/2022 at 3:41 PM  Go to [email or web address] -  for contact info  [facility] - Office  [phone]

## 2022-08-28 NOTE — Progress Notes (Signed)
Patient discharged with instructions given on medications and follow up visits,patient verbalized understanding .Prescriptions sent to Pharmacy of choice documented on AVS. Port-a-cath discontinued per order,catheter intact, patient tolerated deaccess of port. Accompanied by staff to an awaiting vehicle.

## 2022-08-28 NOTE — TOC Transition Note (Signed)
Transition of Care [facility]) - CM/SW Discharge Note   Patient Details  Name: Courtney Zuniga MRN: [id number] Date of Birth: 03-13-1951  Transition of Care [facility]) CM/SW Contact:  Elliot Gault, LCSW Phone Number: 08/28/2022, 3:58 PM   Clinical Narrative:     Pt medically stable for dc. Morrie Sheldon at [facility] accepted pt. They will follow up with pt at home early next week.  No other TOC needs for dc.  Final next level of care: Home w Home Health Services Barriers to Discharge: Barriers Resolved   Patient Goals and CMS Choice CMS Medicare.gov Compare Post Acute Care list provided to:: Patient Choice offered to / list presented to : Patient  Discharge Placement                         Discharge Plan and Services Additional resources added to the After Visit Summary for   In-house Referral: Clinical Social Work                        HH Arranged: PT [facility] Agency: [facility] Date HH Agency Contacted: 08/28/22   Representative spoke with at [facility] Agency: Morrie Sheldon  Social Determinants of Health (SDOH) Interventions SDOH Screenings   Food Insecurity: No Food Insecurity (08/25/2022)  Housing: Low Risk  (08/25/2022)  Transportation Needs: No Transportation Needs (08/25/2022)  Utilities: Not At Risk (08/25/2022)  Financial Resource Strain: Low Risk  (05/05/2018)  Physical Activity: Insufficiently Active (05/05/2018)  Social Connections: Somewhat Isolated (05/05/2018)  Stress: No Stress Concern Present (05/05/2018)  Tobacco Use: Medium Risk (08/25/2022)     Readmission Risk Interventions    08/26/2022   10:43 AM 05/19/2022   12:12 PM 04/26/2022    1:03 PM  Readmission Risk Prevention Plan  Transportation Screening Complete Complete Complete  HRI or Home Care Consult  Complete Complete  Social Work Consult for Recovery Care Planning/Counseling  Complete Complete  Palliative Care Screening  Not Applicable Not Applicable  Medication Review [facility])  Complete Complete Complete  HRI or Home Care Consult Complete    SW Recovery Care/Counseling Consult Complete    Palliative Care Screening Not Applicable    Skilled Nursing Facility Not Applicable

## 2022-08-29 ENCOUNTER — Telehealth: Payer: Self-pay | Admitting: Internal Medicine

## 2022-08-29 LAB — BPAM RBC
Blood Product Expiration Date: 202406162359
ISSUE DATE / TIME: 202405131648
Unit Type and Rh: 5100
Unit Type and Rh: 5100

## 2022-08-29 LAB — TYPE AND SCREEN
ABO/RH(D): O POS
Unit division: 0
Unit division: 0

## 2022-09-04 ENCOUNTER — Ambulatory Visit: Payer: 59 | Admitting: Internal Medicine

## 2022-09-11 ENCOUNTER — Inpatient Hospital Stay: Payer: 59 | Attending: Physician Assistant | Admitting: Internal Medicine

## 2022-09-11 VITALS — BP 105/71 | HR 105 | Temp 98.1°F | Resp 16 | Ht 66.0 in | Wt 116.3 lb

## 2022-09-11 DIAGNOSIS — J449 Chronic obstructive pulmonary disease, unspecified: Secondary | ICD-10-CM | POA: Insufficient documentation

## 2022-09-11 DIAGNOSIS — Z923 Personal history of irradiation: Secondary | ICD-10-CM | POA: Insufficient documentation

## 2022-09-11 DIAGNOSIS — Z9981 Dependence on supplemental oxygen: Secondary | ICD-10-CM | POA: Diagnosis not present

## 2022-09-11 DIAGNOSIS — C3490 Malignant neoplasm of unspecified part of unspecified bronchus or lung: Secondary | ICD-10-CM | POA: Diagnosis not present

## 2022-09-11 DIAGNOSIS — Z79899 Other long term (current) drug therapy: Secondary | ICD-10-CM | POA: Diagnosis not present

## 2022-09-11 DIAGNOSIS — J90 Pleural effusion, not elsewhere classified: Secondary | ICD-10-CM | POA: Diagnosis not present

## 2022-09-11 DIAGNOSIS — C342 Malignant neoplasm of middle lobe, bronchus or lung: Secondary | ICD-10-CM | POA: Diagnosis present

## 2022-09-11 NOTE — Progress Notes (Signed)
[facility] Telephone:([phone]) [phone]   Fax:([phone]) [phone]  OFFICE PROGRESS NOTE  Oneal Grout, FNP [address] [place] [address]  DIAGNOSIS: Recurrent non-small cell lung cancer initially diagnosed as stage IIIA (T1b, N2, M0) non-small cell lung cancer presented with right middle lobe pulmonary nodule, mediastinal lymphadenopathy and highly suspicious for small nodule in the left upper lobe that could change her stage to stage IV that could present another synchronous primary lesion in the left upper lobe. This was diagnosed in September 2017.  PRIOR THERAPY:  1) Concurrent chemoradiation with weekly carboplatin for AUC of 2 and paclitaxel 45 MG/M2 status post 6 cycles last dose was given 02/25/2016 with partial response. 2) Consolidation chemotherapy with carboplatin for AUC of 5 and paclitaxel 175 MG/M2 every 3 weeks with Neulasta support. First dose 04/22/2016. Status post 3 cycles. 3) Second line treatment with immunotherapy with Nivolumab 480 mg IV every 4 weeks status post 32 cycles.  Discontinued secondary to intolerance and frequent hospitalization with pneumonia and pneumonitis.  She had a break of treatment between November 24, 2019 until June 28, 2020. 4) Resuming her treatment with immunotherapy with nivolumab 480 mg IV every 4 weeks.  Cycle #33 started on June 28, 2020.  Status post 55 cycles.  CURRENT THERAPY: Observation  INTERVAL HISTORY: Courtney Zuniga 72 y.o. female returns to the clinic today for follow-up visit.  The patient is feeling fine today with no concerning complaints except for the baseline shortness of breath.  She was seen in the hospital recently with anemia as well as COPD exacerbation.  She had CT angiogram of the chest at that time that showed moderate right pleural effusion and the patient underwent ultrasound-guided thoracentesis with drainage of 350 cc of pleural fluid.  She continues to have intermittent pain on the right side of  the chest.  She denied having any cough or hemoptysis.  She is currently on PPI for gastric ulcer.  She has no nausea, vomiting, diarrhea or constipation.  She is here today for evaluation and recommendation regarding her condition.  MEDICAL HISTORY: Past Medical History:  Diagnosis Date   Anemia    as a young woman   Arthritis    osteoartritis   Asthma    Brain tumor (benign) (HCC) 2005 [facility]   Benign   Chronic headaches    Chronic hip pain    Chronic pain    COPD (chronic obstructive pulmonary disease) (HCC)    Coronary artery disease    Depression    Depression 05/15/2016   Encounter for antineoplastic chemotherapy 01/10/2016   GERD (gastroesophageal reflux disease)    Hypertension    Lung cancer (HCC) dx'd 01/2016   currently on chemo and radiation    NSTEMI (non-ST elevated myocardial infarction) (HCC) yrs ago   On home O2    qhs 2 liters at hs and prn   Pneumonia last time 2 yrs ago   Shortness of breath dyspnea    with activity    ALLERGIES:  is allergic to desyrel [trazodone].  MEDICATIONS:  Current Outpatient Medications  Medication Sig Dispense Refill   acetaminophen (TYLENOL) 325 MG tablet Take 2 tablets (650 mg total) by mouth every 6 (six) hours as needed for mild pain, fever or headache. 12 tablet 2   albuterol (PROVENTIL) (2.5 MG/3ML) 0.083% nebulizer solution Take 3 mLs (2.5 mg total) by nebulization 3 (three) times daily as needed for shortness of breath or wheezing. 75 mL 12  albuterol (VENTOLIN HFA) 108 (90 Base) MCG/ACT inhaler Inhale 2 puffs into the lungs every 4 (four) hours as needed for shortness of breath. 18 g 3   DULoxetine (CYMBALTA) 60 MG capsule Take 1 capsule (60 mg total) by mouth 2 (two) times daily. 180 capsule 3   ENSURE (ENSURE) Take 237 mLs by mouth 3 (three) times daily between meals.     FeFum-FePoly-FA-B Cmp-C-Biot (INTEGRA PLUS) CAPS Take 1 capsule by mouth every morning. 30 capsule 5   fluticasone furoate-vilanterol (BREO ELLIPTA)  100-25 MCG/ACT AEPB Inhale 1 puff into the lungs daily. 28 each 5   guaiFENesin (MUCINEX) 600 MG 12 hr tablet Take 1 tablet (600 mg total) by mouth 2 (two) times daily. 60 tablet 2   Ipratropium-Albuterol (COMBIVENT RESPIMAT) 20-100 MCG/ACT AERS respimat Inhale 1 puff into the lungs every 6 (six) hours as needed for wheezing or shortness of breath. 4 g 3   megestrol (MEGACE ES) 625 MG/5ML suspension Take 5 mLs (625 mg total) by mouth daily. For appetite stimulation 150 mL 3   Multiple Vitamins-Minerals (MULTIVITAMIN WITH MINERALS) tablet Take 1 tablet by mouth daily. 120 tablet 2   oxyCODONE-acetaminophen (PERCOCET/ROXICET) 5-325 MG tablet Take 1 tablet by mouth every 8 (eight) hours as needed for moderate pain. 12 tablet 0   pantoprazole (PROTONIX) 40 MG tablet Take 1 tablet (40 mg total) by mouth 2 (two) times daily. 60 tablet 11   prochlorperazine (COMPAZINE) 10 MG tablet Take 1 tablet (10 mg total) by mouth every 6 (six) hours as needed for nausea or vomiting. 30 tablet 1   tamsulosin (FLOMAX) 0.4 MG CAPS capsule Take 1 capsule (0.4 mg total) by mouth daily after supper. 30 capsule 3   topiramate (TOPAMAX) 25 MG tablet Take 1 tablet (25 mg total) by mouth 2 (two) times daily. 60 tablet 2   traZODone (DESYREL) 50 MG tablet Take 1 tablet (50 mg total) by mouth at bedtime. 30 tablet 5   TRELEGY ELLIPTA 100-62.5-25 MCG/ACT AEPB Inhale 1 puff into the lungs daily. 60 each 3   Vitamin D, Ergocalciferol, (DRISDOL) 1.25 MG (50000 UNIT) CAPS capsule Take 1 capsule (50,000 Units total) by mouth once a week. 5 capsule 3   No current facility-administered medications for this visit.    SURGICAL HISTORY:  Past Surgical History:  Procedure Laterality Date   CHOLECYSTECTOMY     COLONOSCOPY  2015   Results requested from [facility]   COLONOSCOPY     ESOPHAGOGASTRODUODENOSCOPY N/A 08/14/2015   Procedure: ESOPHAGOGASTRODUODENOSCOPY (EGD);  Surgeon: Corbin Ade, MD;  [facility];   Service: Endoscopy;  Laterality: N/A;  215    ESOPHAGOGASTRODUODENOSCOPY (EGD) WITH PROPOFOL N/A 09/13/2015   Procedure: ESOPHAGOGASTRODUODENOSCOPY (EGD) WITH PROPOFOL;  Surgeon: Rachael Fee, MD;  Location: [facility];  Service: Endoscopy;  Laterality: N/A;   ESOPHAGOGASTRODUODENOSCOPY (EGD) WITH PROPOFOL N/A 05/19/2022   Procedure: ESOPHAGOGASTRODUODENOSCOPY (EGD) WITH PROPOFOL;  Surgeon: Lanelle Bal, DO;  Location: [facility];  Service: Endoscopy;  Laterality: N/A;   EUS N/A 03/12/2017   Procedure: UPPER ENDOSCOPIC ULTRASOUND (EUS) RADIAL;  Surgeon: Rachael Fee, MD;  Location: [facility];  Service: Endoscopy;  Laterality: N/A;   HOT HEMOSTASIS  05/19/2022   Procedure: HOT HEMOSTASIS (ARGON PLASMA COAGULATION/BICAP);  Surgeon: Lanelle Bal, DO;  Location: [facility];  Service: Endoscopy;;   IR IMAGING GUIDED PORT INSERTION  01/20/2022   TUMOR REMOVAL  2005   Benign   UPPER ESOPHAGEAL ENDOSCOPIC ULTRASOUND (EUS)  09/13/2015   Procedure: UPPER ESOPHAGEAL ENDOSCOPIC ULTRASOUND (EUS);  Surgeon: Rachael Fee, MD;  Location: [facility];  Service: Endoscopy;;   VIDEO BRONCHOSCOPY WITH ENDOBRONCHIAL NAVIGATION N/A 12/31/2015   Procedure: VIDEO BRONCHOSCOPY WITH ENDOBRONCHIAL NAVIGATION;  Surgeon: Loreli Slot, MD;  Location: [facility] OR;  Service: Thoracic;  Laterality: N/A;   VIDEO BRONCHOSCOPY WITH ENDOBRONCHIAL ULTRASOUND N/A 11/08/2015   Procedure: VIDEO BRONCHOSCOPY WITH ENDOBRONCHIAL ULTRASOUND;  Surgeon: Kerin Perna, MD;  Location: [facility] OR;  Service: Thoracic;  Laterality: N/A;    REVIEW OF SYSTEMS:  Constitutional: positive for fatigue Eyes: negative Ears, nose, mouth, throat, and face: negative Respiratory: positive for dyspnea on exertion Cardiovascular: negative Gastrointestinal: negative Genitourinary:negative Integument/breast: negative Hematologic/lymphatic: negative Musculoskeletal:negative Neurological: negative Behavioral/Psych:  negative Endocrine: negative Allergic/Immunologic: negative   PHYSICAL EXAMINATION: General appearance: alert, cooperative, fatigued, and no distress Head: Normocephalic, without obvious abnormality, atraumatic Neck: no adenopathy, no JVD, supple, symmetrical, trachea midline, and thyroid not enlarged, symmetric, no tenderness/mass/nodules Lymph nodes: Cervical, supraclavicular, and axillary nodes normal. Resp: clear to auscultation bilaterally Back: symmetric, no curvature. ROM normal. No CVA tenderness. Cardio: regular rate and rhythm, S1, S2 normal, no murmur, click, rub or gallop GI: soft, non-tender; bowel sounds normal; no masses,  no organomegaly Extremities: extremities normal, atraumatic, no cyanosis or edema Neurologic: Alert and oriented X 3, normal strength and tone. Normal symmetric reflexes. Normal coordination and gait  ECOG PERFORMANCE STATUS: 1 - Symptomatic but completely ambulatory  Blood pressure 105/71, pulse (!) 105, temperature 98.1 F (36.7 C), temperature source Oral, resp. rate 16, height 5\' 6"  (1.676 m), weight 116 lb 4.8 oz (52.8 kg), SpO2 95 %.  LABORATORY DATA: Lab Results  Component Value Date   WBC 7.1 08/28/2022   HGB 8.5 (L) 08/28/2022   HCT 28.0 (L) 08/28/2022   MCV 70.7 (L) 08/28/2022   PLT 283 08/28/2022      Chemistry      Component Value Date/Time   NA 141 08/26/2022 0445   NA 144 04/09/2017 0948   K 3.8 08/26/2022 0445   K 4.3 04/09/2017 0948   CL 102 08/26/2022 0445   CO2 32 08/26/2022 0445   CO2 25 04/09/2017 0948   BUN 19 08/26/2022 0445   BUN 16.8 04/09/2017 0948   CREATININE 0.58 08/26/2022 0445   CREATININE 0.58 07/01/2022 0827   CREATININE 0.8 04/09/2017 0948      Component Value Date/Time   CALCIUM 8.4 (L) 08/26/2022 0445   CALCIUM 9.1 04/09/2017 0948   ALKPHOS 222 (H) 07/16/2022 1349   ALKPHOS 216 (H) 04/09/2017 0948   AST 16 07/16/2022 1349   AST 15 07/01/2022 0827   AST 12 04/09/2017 0948   ALT 10 07/16/2022 1349    ALT 8 07/01/2022 0827   ALT 13 04/09/2017 0948   BILITOT 0.4 07/16/2022 1349   BILITOT 0.3 07/01/2022 0827   BILITOT 0.22 04/09/2017 0948       RADIOGRAPHIC STUDIES:-   DG CHEST PORT 1 VIEW  Result Date: 08/28/2022 CLINICAL DATA:  [id number] Dyspnea and respiratory abnormalities [id number] EXAM: PORTABLE CHEST 1 VIEW COMPARISON:  Radiograph 08/25/2022 FINDINGS: Chest port catheter tip overlies the superior cavoatrial junction. Unchanged cardiomediastinal silhouette. Unchanged spiculated right upper lobe nodule and right perihilar opacity. Unchanged nodular left basilar opacity and right medial basilar opacities. Unchanged small right pleural effusion and basilar atelectasis. Emphysema with bilateral lung scarring. No new airspace disease. Skin fold overlies the right upper chest, no definite pneumothorax. Bones are unchanged. IMPRESSION: Unchanged small right pleural  effusion and bibasilar atelectasis. Unchanged right perihilar opacities and additional nodular opacities. Emphysema with lung scarring. Electronically Signed   By: Caprice Renshaw M.D.   On: 08/28/2022 08:30   US THORACENTESIS ASP PLEURAL SPACE W/IMG GUIDE  Result Date: 08/25/2022 INDICATION: Patient with a history of lung cancer presents today with a right pleural effusion. Interventional Radiology asked to perform a diagnostic and therapeutic thoracentesis. EXAM: ULTRASOUND GUIDED THORACENTESIS MEDICATIONS: 1% lidocaine 10 ml COMPLICATIONS: None immediate. PROCEDURE: An ultrasound guided thoracentesis was thoroughly discussed with the patient and questions answered. The benefits, risks, alternatives and complications were also discussed. The patient understands and wishes to proceed with the procedure. Written consent was obtained. Ultrasound was performed to localize and mark an adequate pocket of fluid in the right chest. The area was then prepped and draped in the normal sterile fashion. 1% Lidocaine was used for local anesthesia. Under  ultrasound guidance a 6 Fr Safe-T-Centesis catheter was introduced. Thoracentesis was performed. The catheter was removed and a dressing applied. FINDINGS: A total of approximately 350 ml of amber-colored fluid was removed. Samples were sent to the laboratory as requested by the clinical team. IMPRESSION: Successful ultrasound guided right thoracentesis yielding 350 ml of pleural fluid. Procedure performed by: Alwyn Ren, NP Electronically Signed   By: Olive Bass M.D.   On: 08/25/2022 15:07   DG Chest 1 View  Result Date: 08/25/2022 CLINICAL DATA:  Post thoracentesis. EXAM: CHEST  1 VIEW COMPARISON:  08/25/2022 at 9:28 a.m. FINDINGS: Right-sided Port-A-Cath unchanged. Lungs are adequately inflated demonstrate improvement in patients right-sided pleural effusion post thoracentesis. Small amount of residual right pleural fluid with atelectasis is present in the right base. No definite pneumothorax. Remainder of the exam is unchanged. IMPRESSION: Improvement in patients right-sided pleural effusion post thoracentesis. Small amount of residual right pleural fluid with atelectasis in the right base. No pneumothorax. Electronically Signed   By: Elberta Fortis M.D.   On: 08/25/2022 14:11   CT Angio Chest PE W and/or Wo Contrast  Result Date: 08/25/2022 CLINICAL DATA:  Increased shortness of breath for over 2 months. Patient wears 4 L of continuous oxygen cannula at home due to lung cancer. EXAM: CT ANGIOGRAPHY CHEST WITH CONTRAST TECHNIQUE: Multidetector CT imaging of the chest was performed using the standard protocol during bolus administration of intravenous contrast. Multiplanar CT image reconstructions and MIPs were obtained to evaluate the vascular anatomy. RADIATION DOSE REDUCTION: This exam was performed according to the departmental dose-optimization program which includes automated exposure control, adjustment of the mA and/or kV according to patient size and/or use of iterative reconstruction  technique. CONTRAST:  75mL OMNIPAQUE IOHEXOL 350 MG/ML SOLN COMPARISON:  CT examination dated June 25, 2018 FINDINGS: Cardiovascular: Satisfactory opacification of the pulmonary arteries to the segmental level. No evidence of pulmonary embolism. Normal heart size. No pericardial effusion. Main pulmonary trunk is dilated measuring up to 3.5 cm concerning for pulmonary arterial hypertension. Right IJ access MediPort with distal tip in the SVC. Mediastinum/Nodes: No enlarged mediastinal, hilar, or axillary lymph nodes. Thyroid gland, trachea, and esophagus demonstrate no significant findings. Lungs/Pleura: Emphysematous changes with biapical pleural/parenchymal scarring. Right upper lobe spiculated mass measuring a proximally 1.8 x 1.3 cm, not significantly changed. Interval development of moderate-sized right pleural effusion. Bilateral lower lobe lung opacities, the left lower lobe opacity measuring 13 x 11 mm and partially imaged right lower lobe opacity measuring 8 x 17 mm. These are not significantly changed from prior examination and likely represent metastatic disease. Upper Abdomen:  Cholecystectomy changes.  No acute abnormality. Musculoskeletal: No chest wall abnormality. No acute or significant osseous findings. T12 vertebral body hemangioma. Review of the MIP images confirms the above findings. IMPRESSION: 1. No evidence of pulmonary embolism. 2. Interval development of moderate-sized right pleural effusion. 3. Right upper lobe spiculated mass measuring 1.8 x 1.3 cm, not significantly changed from prior examination. 4. Bilateral lower lobe lung opacities, not significantly changed from prior examination and likely represent metastatic disease. 5. Dilated main pulmonary trunk measuring up to 3.5 cm concerning for pulmonary arterial hypertension. Aortic Atherosclerosis (ICD10-I70.0) and Emphysema (ICD10-J43.9). Electronically Signed   By: Larose Hires D.O.   On: 08/25/2022 12:03   DG Chest Port 1  View  Result Date: 08/25/2022 CLINICAL DATA:  Shortness of breath. EXAM: PORTABLE CHEST 1 VIEW COMPARISON:  CT chest dated June 25, 2022 FINDINGS: The heart size and mediastinal contours are within normal limits. Hyperinflated lungs with emphysematous changes of the bilateral upper lobes. Bibasilar scarring/atelectasis, not significantly changed. Right IJ access MediPort with distal tip in the SVC. No acute osseous abnormality. IMPRESSION: 1. Hyperinflated lungs with emphysematous changes of the bilateral upper lobes, unchanged. 2. Bibasilar scarring/atelectasis, not significantly changed. No evidence of acute pneumonia or large pleural effusion. Electronically Signed   By: Larose Hires D.O.   On: 08/25/2022 09:49     ASSESSMENT AND PLAN:  This is a very pleasant 72 years old African-American female with a recurrent non-small cell lung cancer initially diagnosed as stage IIIA non-small cell lung cancer status post concurrent chemoradiation followed by consolidation chemotherapy with carboplatin and paclitaxel for 3 cycles. The patient was followed by observation but restaging imaging studies showed evidence for disease progression. She was started on second line treatment with immunotherapy with Nivolumab 480 mg IV every 4 weeks status post 32 cycles.   The patient has been in observation since August 2021 with no concerning complaints except for the baseline shortness of breath and intermittent headache. The patient resumed her treatment with cycle number cycle #33 of single agent nivolumab 480 mg IV every 4 weeks in March 2022 status post 55 cycles.  Her treatment is currently on hold send The patient has been on treatment for long time with no clear evidence for disease progression. She had CT angiogram of the chest performed recently that showed no concerning findings for disease progression. I recommended for her to continue on observation with repeat CT scan of the chest in 3 months. For the  history of COPD, she will continue her current home oxygen and nebulizer as prescribed by her pulmonologist and primary care physician. The patient was advised to call immediately if she has any other concerning symptoms in the interval.  All questions were answered. The patient knows to call the clinic with any problems, questions or concerns. We can certainly see the patient much sooner if necessary.  Disclaimer: This note was dictated with voice recognition software. Similar sounding words can inadvertently be transcribed and may not be corrected upon review.

## 2022-09-13 ENCOUNTER — Other Ambulatory Visit: Payer: Self-pay

## 2022-09-17 ENCOUNTER — Encounter: Payer: Self-pay | Admitting: Internal Medicine

## 2022-10-01 ENCOUNTER — Ambulatory Visit (HOSPITAL_COMMUNITY): Payer: 59

## 2022-10-06 ENCOUNTER — Emergency Department (HOSPITAL_COMMUNITY): Payer: 59

## 2022-10-06 ENCOUNTER — Other Ambulatory Visit: Payer: Self-pay

## 2022-10-06 ENCOUNTER — Observation Stay (HOSPITAL_COMMUNITY): Payer: 59

## 2022-10-06 ENCOUNTER — Inpatient Hospital Stay (HOSPITAL_COMMUNITY)
Admission: EM | Admit: 2022-10-06 | Discharge: 2022-10-08 | DRG: 291 | Disposition: A | Discharge status: Home / Self Care | Payer: 59 | Attending: Internal Medicine | Admitting: Internal Medicine

## 2022-10-06 ENCOUNTER — Encounter (HOSPITAL_COMMUNITY): Payer: Self-pay | Admitting: Emergency Medicine

## 2022-10-06 DIAGNOSIS — I110 Hypertensive heart disease with heart failure: Principal | ICD-10-CM | POA: Diagnosis present

## 2022-10-06 DIAGNOSIS — R0602 Shortness of breath: Secondary | ICD-10-CM | POA: Diagnosis not present

## 2022-10-06 DIAGNOSIS — I10 Essential (primary) hypertension: Secondary | ICD-10-CM | POA: Diagnosis present

## 2022-10-06 DIAGNOSIS — Z888 Allergy status to other drugs, medicaments and biological substances status: Secondary | ICD-10-CM

## 2022-10-06 DIAGNOSIS — R0609 Other forms of dyspnea: Secondary | ICD-10-CM | POA: Diagnosis present

## 2022-10-06 DIAGNOSIS — J441 Chronic obstructive pulmonary disease with (acute) exacerbation: Secondary | ICD-10-CM | POA: Diagnosis present

## 2022-10-06 DIAGNOSIS — Z1152 Encounter for screening for COVID-19: Secondary | ICD-10-CM

## 2022-10-06 DIAGNOSIS — Z9981 Dependence on supplemental oxygen: Secondary | ICD-10-CM

## 2022-10-06 DIAGNOSIS — J9611 Chronic respiratory failure with hypoxia: Secondary | ICD-10-CM | POA: Diagnosis present

## 2022-10-06 DIAGNOSIS — Z8041 Family history of malignant neoplasm of ovary: Secondary | ICD-10-CM

## 2022-10-06 DIAGNOSIS — Z86011 Personal history of benign neoplasm of the brain: Secondary | ICD-10-CM

## 2022-10-06 DIAGNOSIS — C3491 Malignant neoplasm of unspecified part of right bronchus or lung: Secondary | ICD-10-CM | POA: Diagnosis not present

## 2022-10-06 DIAGNOSIS — I252 Old myocardial infarction: Secondary | ICD-10-CM

## 2022-10-06 DIAGNOSIS — K219 Gastro-esophageal reflux disease without esophagitis: Secondary | ICD-10-CM | POA: Diagnosis present

## 2022-10-06 DIAGNOSIS — Z8711 Personal history of peptic ulcer disease: Secondary | ICD-10-CM

## 2022-10-06 DIAGNOSIS — D509 Iron deficiency anemia, unspecified: Secondary | ICD-10-CM | POA: Diagnosis present

## 2022-10-06 DIAGNOSIS — F32A Depression, unspecified: Secondary | ICD-10-CM | POA: Diagnosis present

## 2022-10-06 DIAGNOSIS — Z923 Personal history of irradiation: Secondary | ICD-10-CM

## 2022-10-06 DIAGNOSIS — Z79899 Other long term (current) drug therapy: Secondary | ICD-10-CM

## 2022-10-06 DIAGNOSIS — Z95828 Presence of other vascular implants and grafts: Secondary | ICD-10-CM

## 2022-10-06 DIAGNOSIS — R600 Localized edema: Principal | ICD-10-CM

## 2022-10-06 DIAGNOSIS — Z87891 Personal history of nicotine dependence: Secondary | ICD-10-CM

## 2022-10-06 DIAGNOSIS — Z801 Family history of malignant neoplasm of trachea, bronchus and lung: Secondary | ICD-10-CM

## 2022-10-06 DIAGNOSIS — J479 Bronchiectasis, uncomplicated: Secondary | ICD-10-CM | POA: Diagnosis present

## 2022-10-06 DIAGNOSIS — Z808 Family history of malignant neoplasm of other organs or systems: Secondary | ICD-10-CM

## 2022-10-06 DIAGNOSIS — Z9221 Personal history of antineoplastic chemotherapy: Secondary | ICD-10-CM

## 2022-10-06 DIAGNOSIS — Z9049 Acquired absence of other specified parts of digestive tract: Secondary | ICD-10-CM

## 2022-10-06 DIAGNOSIS — I5033 Acute on chronic diastolic (congestive) heart failure: Secondary | ICD-10-CM | POA: Diagnosis present

## 2022-10-06 DIAGNOSIS — C3411 Malignant neoplasm of upper lobe, right bronchus or lung: Secondary | ICD-10-CM | POA: Diagnosis present

## 2022-10-06 DIAGNOSIS — Z8042 Family history of malignant neoplasm of prostate: Secondary | ICD-10-CM

## 2022-10-06 DIAGNOSIS — I2510 Atherosclerotic heart disease of native coronary artery without angina pectoris: Secondary | ICD-10-CM | POA: Diagnosis present

## 2022-10-06 DIAGNOSIS — Z7951 Long term (current) use of inhaled steroids: Secondary | ICD-10-CM

## 2022-10-06 LAB — CBC WITH DIFFERENTIAL/PLATELET
Abs Immature Granulocytes: 0.02 10*3/uL (ref 0.00–0.07)
Basophils Absolute: 0 10*3/uL (ref 0.0–0.1)
Basophils Relative: 1 %
Eosinophils Absolute: 0.1 10*3/uL (ref 0.0–0.5)
Eosinophils Relative: 2 %
HCT: 34 % — ABNORMAL LOW (ref 36.0–46.0)
Hemoglobin: 10.6 g/dL — ABNORMAL LOW (ref 12.0–15.0)
Immature Granulocytes: 0 %
Lymphocytes Relative: 16 %
Lymphs Abs: 1 10*3/uL (ref 0.7–4.0)
MCH: 24 pg — ABNORMAL LOW (ref 26.0–34.0)
MCHC: 31.2 g/dL (ref 30.0–36.0)
MCV: 77.1 fL — ABNORMAL LOW (ref 80.0–100.0)
Monocytes Absolute: 0.3 10*3/uL (ref 0.1–1.0)
Monocytes Relative: 5 %
Neutro Abs: 4.9 10*3/uL (ref 1.7–7.7)
Neutrophils Relative %: 76 %
Platelets: 256 10*3/uL (ref 150–400)
RBC: 4.41 MIL/uL (ref 3.87–5.11)
RDW: 27.1 % — ABNORMAL HIGH (ref 11.5–15.5)
WBC: 6.4 10*3/uL (ref 4.0–10.5)
nRBC: 0 % (ref 0.0–0.2)

## 2022-10-06 LAB — BASIC METABOLIC PANEL
Anion gap: 10 (ref 5–15)
BUN: 12 mg/dL (ref 8–23)
CO2: 27 mmol/L (ref 22–32)
Calcium: 8.6 mg/dL — ABNORMAL LOW (ref 8.9–10.3)
Chloride: 103 mmol/L (ref 98–111)
Creatinine, Ser: 0.62 mg/dL (ref 0.44–1.00)
GFR, Estimated: >60 mL/min (ref >60)
Glucose, Bld: 123 mg/dL — ABNORMAL HIGH (ref 70–99)
Potassium: 3.6 mmol/L (ref 3.5–5.1)
Sodium: 140 mmol/L (ref 135–145)

## 2022-10-06 LAB — D-DIMER, QUANTITATIVE: D-Dimer, Quant: 1.66 ug/mL-FEU — ABNORMAL HIGH (ref 0.00–0.50)

## 2022-10-06 LAB — MAGNESIUM: Magnesium: 1.8 mg/dL (ref 1.7–2.4)

## 2022-10-06 LAB — BRAIN NATRIURETIC PEPTIDE: B Natriuretic Peptide: 179 pg/mL — ABNORMAL HIGH (ref 0.0–100.0)

## 2022-10-06 LAB — TROPONIN I (HIGH SENSITIVITY)
Troponin I (High Sensitivity): 6 ng/L (ref <18)
Troponin I (High Sensitivity): 6 ng/L (ref <18)

## 2022-10-06 LAB — RESP PANEL BY RT-PCR (RSV, FLU A&B, COVID)  RVPGX2
Influenza A by PCR: NEGATIVE
Influenza B by PCR: NEGATIVE
Resp Syncytial Virus by PCR: NEGATIVE
SARS Coronavirus 2 by RT PCR: NEGATIVE

## 2022-10-06 LAB — PHOSPHORUS: Phosphorus: 3.7 mg/dL (ref 2.5–4.6)

## 2022-10-06 LAB — TSH: TSH: 1.897 u[IU]/mL (ref 0.350–4.500)

## 2022-10-06 MED ORDER — TRAZODONE HCL 50 MG PO TABS
50.0000 mg | ORAL_TABLET | Freq: Every day | ORAL | Status: DC
  Administered 2022-10-06 – 2022-10-07 (×2): 50 mg via ORAL
  Filled 2022-10-06 (×2): qty 1

## 2022-10-06 MED ORDER — FUROSEMIDE 10 MG/ML IJ SOLN
20.0000 mg | Freq: Two times a day (BID) | INTRAMUSCULAR | Status: DC
  Administered 2022-10-07 (×3): 20 mg via INTRAVENOUS
  Filled 2022-10-06 (×3): qty 2

## 2022-10-06 MED ORDER — TAMSULOSIN HCL 0.4 MG PO CAPS
0.4000 mg | ORAL_CAPSULE | Freq: Every day | ORAL | Status: DC
  Administered 2022-10-06 – 2022-10-07 (×2): 0.4 mg via ORAL
  Filled 2022-10-06 (×2): qty 1

## 2022-10-06 MED ORDER — ONDANSETRON HCL 4 MG PO TABS: 4.0000 mg | ORAL_TABLET | Freq: Four times a day (QID) | ORAL | Status: DC | PRN

## 2022-10-06 MED ORDER — METHYLPREDNISOLONE SODIUM SUCC 125 MG IJ SOLR
125.0000 mg | Freq: Once | INTRAMUSCULAR | Status: AC
  Administered 2022-10-06: 125 mg via INTRAVENOUS
  Filled 2022-10-06: qty 2

## 2022-10-06 MED ORDER — ENOXAPARIN SODIUM 40 MG/0.4ML IJ SOSY
40.0000 mg | PREFILLED_SYRINGE | INTRAMUSCULAR | Status: DC
  Administered 2022-10-06 – 2022-10-07 (×2): 40 mg via SUBCUTANEOUS
  Filled 2022-10-06 (×2): qty 0.4

## 2022-10-06 MED ORDER — ONDANSETRON HCL 4 MG/2ML IJ SOLN: 4.0000 mg | Freq: Four times a day (QID) | INTRAMUSCULAR | Status: DC | PRN

## 2022-10-06 MED ORDER — MEGESTROL ACETATE 400 MG/10ML PO SUSP
400.0000 mg | Freq: Every day | ORAL | Status: DC
  Administered 2022-10-07 – 2022-10-08 (×2): 400 mg via ORAL
  Filled 2022-10-06 (×2): qty 10

## 2022-10-06 MED ORDER — ALBUTEROL SULFATE HFA 108 (90 BASE) MCG/ACT IN AERS: 2.0000 | INHALATION_SPRAY | RESPIRATORY_TRACT | Status: DC | PRN

## 2022-10-06 MED ORDER — BUDESONIDE 0.5 MG/2ML IN SUSP
0.5000 mg | Freq: Two times a day (BID) | RESPIRATORY_TRACT | Status: DC
  Administered 2022-10-06 – 2022-10-08 (×4): 0.5 mg via RESPIRATORY_TRACT
  Filled 2022-10-06 (×4): qty 2

## 2022-10-06 MED ORDER — METHYLPREDNISOLONE SODIUM SUCC 40 MG IJ SOLR
40.0000 mg | Freq: Two times a day (BID) | INTRAMUSCULAR | Status: DC
  Administered 2022-10-07 – 2022-10-08 (×3): 40 mg via INTRAVENOUS
  Filled 2022-10-06 (×3): qty 1

## 2022-10-06 MED ORDER — DULOXETINE HCL 60 MG PO CPEP
60.0000 mg | ORAL_CAPSULE | Freq: Two times a day (BID) | ORAL | Status: DC
  Administered 2022-10-06 – 2022-10-08 (×4): 60 mg via ORAL
  Filled 2022-10-06 (×4): qty 1

## 2022-10-06 MED ORDER — ACETAMINOPHEN 650 MG RE SUPP: 650.0000 mg | Freq: Four times a day (QID) | RECTAL | Status: DC | PRN

## 2022-10-06 MED ORDER — IPRATROPIUM-ALBUTEROL 0.5-2.5 (3) MG/3ML IN SOLN
3.0000 mL | Freq: Once | RESPIRATORY_TRACT | Status: AC
  Administered 2022-10-06: 3 mL via RESPIRATORY_TRACT
  Filled 2022-10-06: qty 3

## 2022-10-06 MED ORDER — TOPIRAMATE 25 MG PO TABS
25.0000 mg | ORAL_TABLET | Freq: Two times a day (BID) | ORAL | Status: DC
  Administered 2022-10-06 – 2022-10-08 (×4): 25 mg via ORAL
  Filled 2022-10-06 (×4): qty 1

## 2022-10-06 MED ORDER — DM-GUAIFENESIN ER 30-600 MG PO TB12
1.0000 | ORAL_TABLET | Freq: Two times a day (BID) | ORAL | Status: DC
  Administered 2022-10-06 – 2022-10-08 (×4): 1 via ORAL
  Filled 2022-10-06 (×4): qty 1

## 2022-10-06 MED ORDER — FUROSEMIDE 10 MG/ML IJ SOLN
20.0000 mg | Freq: Once | INTRAMUSCULAR | Status: AC
  Administered 2022-10-06: 20 mg via INTRAVENOUS
  Filled 2022-10-06: qty 2

## 2022-10-06 MED ORDER — PANTOPRAZOLE SODIUM 40 MG PO TBEC
40.0000 mg | DELAYED_RELEASE_TABLET | Freq: Two times a day (BID) | ORAL | Status: DC
  Administered 2022-10-06 – 2022-10-08 (×4): 40 mg via ORAL
  Filled 2022-10-06 (×4): qty 1

## 2022-10-06 MED ORDER — SODIUM CHLORIDE 0.9% FLUSH
3.0000 mL | Freq: Two times a day (BID) | INTRAVENOUS | Status: DC
  Administered 2022-10-06 – 2022-10-08 (×5): 3 mL via INTRAVENOUS

## 2022-10-06 MED ORDER — SODIUM CHLORIDE 0.9% FLUSH: 3.0000 mL | INTRAVENOUS | Status: DC | PRN

## 2022-10-06 MED ORDER — ENSURE ENLIVE PO LIQD
237.0000 mL | Freq: Three times a day (TID) | ORAL | Status: DC
  Administered 2022-10-06 – 2022-10-08 (×6): 237 mL via ORAL
  Filled 2022-10-06 (×7): qty 237

## 2022-10-06 MED ORDER — SODIUM CHLORIDE 0.9 % IV SOLN: 250.0000 mL | INTRAVENOUS | Status: DC | PRN

## 2022-10-06 MED ORDER — ACETAMINOPHEN 325 MG PO TABS
650.0000 mg | ORAL_TABLET | Freq: Four times a day (QID) | ORAL | Status: DC | PRN
  Administered 2022-10-07 – 2022-10-08 (×2): 650 mg via ORAL
  Filled 2022-10-06 (×2): qty 2

## 2022-10-06 MED ORDER — ADULT MULTIVITAMIN W/MINERALS CH
1.0000 | ORAL_TABLET | Freq: Every day | ORAL | Status: DC
  Administered 2022-10-07 – 2022-10-08 (×2): 1 via ORAL
  Filled 2022-10-06 (×2): qty 1

## 2022-10-06 MED ORDER — OXYCODONE-ACETAMINOPHEN 5-325 MG PO TABS
1.0000 | ORAL_TABLET | Freq: Three times a day (TID) | ORAL | Status: DC | PRN
  Administered 2022-10-06: 1 via ORAL
  Filled 2022-10-06: qty 1

## 2022-10-06 MED ORDER — POTASSIUM CHLORIDE CRYS ER 20 MEQ PO TBCR
20.0000 meq | EXTENDED_RELEASE_TABLET | Freq: Once | ORAL | Status: AC
  Administered 2022-10-06: 20 meq via ORAL
  Filled 2022-10-06: qty 1

## 2022-10-06 MED ORDER — TECHNETIUM TO 99M ALBUMIN AGGREGATED
4.4000 | Freq: Once | INTRAVENOUS | Status: AC | PRN
  Administered 2022-10-06: 4.4 via INTRAVENOUS

## 2022-10-06 MED ORDER — ARFORMOTEROL TARTRATE 15 MCG/2ML IN NEBU
15.0000 ug | INHALATION_SOLUTION | Freq: Two times a day (BID) | RESPIRATORY_TRACT | Status: DC
  Administered 2022-10-06 – 2022-10-08 (×4): 15 ug via RESPIRATORY_TRACT
  Filled 2022-10-06 (×4): qty 2

## 2022-10-06 MED ORDER — DOXYCYCLINE HYCLATE 100 MG PO TABS
100.0000 mg | ORAL_TABLET | Freq: Two times a day (BID) | ORAL | Status: DC
  Administered 2022-10-06 – 2022-10-08 (×4): 100 mg via ORAL
  Filled 2022-10-06 (×5): qty 1

## 2022-10-06 MED ORDER — IPRATROPIUM-ALBUTEROL 0.5-2.5 (3) MG/3ML IN SOLN
3.0000 mL | Freq: Four times a day (QID) | RESPIRATORY_TRACT | Status: DC | PRN
  Administered 2022-10-07: 3 mL via RESPIRATORY_TRACT
  Filled 2022-10-06: qty 3

## 2022-10-06 NOTE — Assessment & Plan Note (Signed)
-  Continue home oxygen supplementation.

## 2022-10-06 NOTE — Assessment & Plan Note (Signed)
-  As mentioned above continue treatment with bronchodilator management and nebulizer treatments -Pulm has been encouraged -Steroids and empirical doxycycline for bronchiectasis provide -Follow clinical response and continue home oxygen supplementation.

## 2022-10-06 NOTE — H&P (Signed)
History and Physical    PatientNathalie Zuniga ZOX:[id number] DOB: 05-Aug-1950 DOA: 10/06/2022 DOS: the patient was seen and examined on 10/06/2022 PCP: Oneal Grout, FNP  Patient coming from: Home  Chief Complaint:  Chief Complaint  Patient presents with   Leg Swelling   HPI: Courtney Zuniga is a 72 y.o. female with medical history significant of COPD, depression, hypertension, lung cancer, chronic respiratory failure using 4 L nasal cannula supplementation at baseline and history of coronary disease; presented to the hospital secondary to lower extremity swelling, shortness of breath and tachypnea.  Patient reports symptom has been present for the last 3-4 weeks and worsening.  She reported no fever, no nausea, no vomiting, no sick contacts or any abdominal discomfort.  Patient expressed pleuritic right-sided lower chest discomfort present for the last week or so. After further discussion patient denies orthopnea dyspnea on exertion and lack of improvement with home inhalers.  Chest x-ray in the ED demonstrating chronic bronchitic changes and the presence of a spiculated right upper lobe and some bandlike opacity with fullness of the right lung healing; there is a perihilar region unchanged from previous.  Chest Port-A-Cath in place.  Lower extremity Dopplers negative for DVT.  Positive D-dimer at 1.66 negative VQ scan.  Respiratory panel negative for COVID no answer.  Positive expiratory wheezing and fine crackles appreciated on exam; patient received Lasix, steroids and nebulizer management in the ED with some improvement in her symptoms.  TRH contacted to place in the hospital for further evaluation and management what appears to be COPD and CHF exacerbation.   Review of Systems: As mentioned in the history of present illness. All other systems reviewed and are negative. Past Medical History:  Diagnosis Date   Anemia    as a young woman   Arthritis    osteoartritis   Asthma     Brain tumor (benign) (HCC) 2005 [facility]   Benign   Chronic headaches    Chronic hip pain    Chronic pain    COPD (chronic obstructive pulmonary disease) (HCC)    Coronary artery disease    Depression    Depression 05/15/2016   Encounter for antineoplastic chemotherapy 01/10/2016   GERD (gastroesophageal reflux disease)    Hypertension    Lung cancer (HCC) dx'd 01/2016   currently on chemo and radiation    NSTEMI (non-ST elevated myocardial infarction) (HCC) yrs ago   On home O2    qhs 2 liters at hs and prn   Pneumonia last time 2 yrs ago   Shortness of breath dyspnea    with activity   Past Surgical History:  Procedure Laterality Date   CHOLECYSTECTOMY     COLONOSCOPY  2015   Results requested from [facility]   COLONOSCOPY     ESOPHAGOGASTRODUODENOSCOPY N/A 08/14/2015   Procedure: ESOPHAGOGASTRODUODENOSCOPY (EGD);  Surgeon: Corbin Ade, MD;  Location: [facility];  Service: Endoscopy;  Laterality: N/A;  215    ESOPHAGOGASTRODUODENOSCOPY (EGD) WITH PROPOFOL N/A 09/13/2015   Procedure: ESOPHAGOGASTRODUODENOSCOPY (EGD) WITH PROPOFOL;  Surgeon: Rachael Fee, MD;  Location: [facility];  Service: Endoscopy;  Laterality: N/A;   ESOPHAGOGASTRODUODENOSCOPY (EGD) WITH PROPOFOL N/A 05/19/2022   Procedure: ESOPHAGOGASTRODUODENOSCOPY (EGD) WITH PROPOFOL;  Surgeon: Lanelle Bal, DO;  Location: [facility];  Service: Endoscopy;  Laterality: N/A;   EUS N/A 03/12/2017   Procedure: UPPER ENDOSCOPIC ULTRASOUND (EUS) RADIAL;  Surgeon: Rachael Fee, MD;  Location: [facility];  Service: Endoscopy;  Laterality: N/A;  HOT HEMOSTASIS  05/19/2022   Procedure: HOT HEMOSTASIS (ARGON PLASMA COAGULATION/BICAP);  Surgeon: Lanelle Bal, DO;  Location: [facility];  Service: Endoscopy;;   IR IMAGING GUIDED PORT INSERTION  01/20/2022   TUMOR REMOVAL  2005   Benign   UPPER ESOPHAGEAL ENDOSCOPIC ULTRASOUND (EUS)  09/13/2015   Procedure: UPPER ESOPHAGEAL ENDOSCOPIC ULTRASOUND (EUS);   Surgeon: Rachael Fee, MD;  Location: [facility];  Service: Endoscopy;;   VIDEO BRONCHOSCOPY WITH ENDOBRONCHIAL NAVIGATION N/A 12/31/2015   Procedure: VIDEO BRONCHOSCOPY WITH ENDOBRONCHIAL NAVIGATION;  Surgeon: Loreli Slot, MD;  Location: [facility] OR;  Service: Thoracic;  Laterality: N/A;   VIDEO BRONCHOSCOPY WITH ENDOBRONCHIAL ULTRASOUND N/A 11/08/2015   Procedure: VIDEO BRONCHOSCOPY WITH ENDOBRONCHIAL ULTRASOUND;  Surgeon: Kerin Perna, MD;  Location: [facility] OR;  Service: Thoracic;  Laterality: N/A;   Social History:  reports that she quit smoking about 4 years ago. Her smoking use included cigarettes. She has a 38.00 pack-year smoking history. She has never used smokeless tobacco. She reports that she does not drink alcohol and does not use drugs.  Allergies  Allergen Reactions   Desyrel [Trazodone]     "Made me faint"    Family History  Problem Relation Age of Onset   Ovarian cancer Mother    Lung cancer Father    Brain cancer Sister        Half sister   Lung cancer Brother    Lung cancer Brother    Prostate cancer Brother    Colon cancer Neg Hx    Colon polyps Neg Hx     Prior to Admission medications   Medication Sig Start Date End Date Taking? Authorizing Provider  acetaminophen (TYLENOL) 325 MG tablet Take 2 tablets (650 mg total) by mouth every 6 (six) hours as needed for mild pain, fever or headache. 05/08/18   Mariea Clonts, Courage, MD  albuterol (PROVENTIL) (2.5 MG/3ML) 0.083% nebulizer solution Take 3 mLs (2.5 mg total) by nebulization 3 (three) times daily as needed for shortness of breath or wheezing. 08/28/22   Shon Hale, MD  albuterol (VENTOLIN HFA) 108 (90 Base) MCG/ACT inhaler Inhale 2 puffs into the lungs every 4 (four) hours as needed for shortness of breath. 08/28/22   Shon Hale, MD  DULoxetine (CYMBALTA) 60 MG capsule Take 1 capsule (60 mg total) by mouth 2 (two) times daily. 08/28/22   Emokpae, Courage, MD  ENSURE (ENSURE) Take 237 mLs by mouth 3  (three) times daily between meals.    [provider]  FeFum-FePoly-FA-B Cmp-C-Biot (INTEGRA PLUS) CAPS Take 1 capsule by mouth every morning. 08/28/22   Emokpae, Courage, MD  fluticasone furoate-vilanterol (BREO ELLIPTA) 100-25 MCG/ACT AEPB Inhale 1 puff into the lungs daily. 08/28/22   Shon Hale, MD  guaiFENesin (MUCINEX) 600 MG 12 hr tablet Take 1 tablet (600 mg total) by mouth 2 (two) times daily. 08/28/22 08/28/23  Shon Hale, MD  Ipratropium-Albuterol (COMBIVENT RESPIMAT) 20-100 MCG/ACT AERS respimat Inhale 1 puff into the lungs every 6 (six) hours as needed for wheezing or shortness of breath. 08/28/22   Shon Hale, MD  megestrol (MEGACE ES) 625 MG/5ML suspension Take 5 mLs (625 mg total) by mouth daily. For appetite stimulation 08/28/22   Shon Hale, MD  Multiple Vitamins-Minerals (MULTIVITAMIN WITH MINERALS) tablet Take 1 tablet by mouth daily. 08/28/22 08/28/23  Shon Hale, MD  oxyCODONE-acetaminophen (PERCOCET/ROXICET) 5-325 MG tablet Take 1 tablet by mouth every 8 (eight) hours as needed for moderate pain. 05/08/18   Shon Hale, MD  pantoprazole (PROTONIX) 40 MG tablet Take 1 tablet (40 mg total) by mouth 2 (two) times daily. 08/28/22 08/28/23  Shon Hale, MD  prochlorperazine (COMPAZINE) 10 MG tablet Take 1 tablet (10 mg total) by mouth every 6 (six) hours as needed for nausea or vomiting. 08/28/22   Shon Hale, MD  tamsulosin (FLOMAX) 0.4 MG CAPS capsule Take 1 capsule (0.4 mg total) by mouth daily after supper. 08/28/22   Shon Hale, MD  topiramate (TOPAMAX) 25 MG tablet Take 1 tablet (25 mg total) by mouth 2 (two) times daily. 08/28/22   Shon Hale, MD  traZODone (DESYREL) 50 MG tablet Take 1 tablet (50 mg total) by mouth at bedtime. 08/28/22   Emokpae, Courage, MD  TRELEGY ELLIPTA 100-62.5-25 MCG/ACT AEPB Inhale 1 puff into the lungs daily. 08/28/22   Shon Hale, MD  Vitamin D, Ergocalciferol, (DRISDOL) 1.25 MG (50000 UNIT)  CAPS capsule Take 1 capsule (50,000 Units total) by mouth once a week. 08/28/22   Shon Hale, MD  HYDROcodone bit-homatropine (HYCODAN) 5-1.5 MG/5ML syrup Take 5 mLs by mouth every 6 (six) hours as needed for cough. 11/10/21   Si Gaul, MD    Physical Exam: Vitals:   10/06/22 1524 10/06/22 1545 10/06/22 1607 10/06/22 1908  BP: 120/67 123/79 107/67   Pulse: 89 94 95   Resp: (!) 26 (!) 33 (!) 26   Temp:   98.4 F (36.9 C)   TempSrc:   Oral   SpO2: 100% 100% 99% 92%  Weight:   54.5 kg   Height:   5\' 7"  (1.702 m)    General exam: Alert, awake, oriented x 3; no nausea, no vomiting, no fever.  Patient demonstrated difficulty to speak in full sentences.  Good saturation on chronic oxygen supplementation (4 L) appreciated. Respiratory system: Positive quiet, decreased breath sounds at the bases, expiratory wheezing and fine crackles appreciated on exam.  No using accessory muscle.  Tachypnea with exertion reported by nursing staff. Cardiovascular system:RRR. No rubs or gallops; no JVD. Gastrointestinal system: Abdomen is nondistended, soft and nontender. No organomegaly or masses felt. Normal bowel sounds heard. Central nervous system: Alert and oriented. No focal neurological deficits. Extremities: No cyanosis or clubbing; 1+ edema appreciated bilaterally. Skin: No petechiae. Psychiatry: Judgement and insight appear normal. Mood & affect appropriate.   Data Reviewed: As mentioned in HPI section.  Assessment and Plan: * SOB (shortness of breath) -Appears multifactorial in the setting of COPD exacerbation and valvular heart disease decompensation (equivalent to acute on chronic diastolic heart failure) recent echocardiogram demonstrated preserved ejection fraction. -Continue treatment with steroids, bronchodilators/nebulizer management, mucolytic's, IV Lasix, daily weights, strict intake and output, TED hoses, low-sodium diet and the use of flutter valve -Currently demonstrating  good saturation on chronic supplementation. -Empirically started on oral doxycycline for component of bronchiectasis -Follow clinical response.  GERD (gastroesophageal reflux disease) -Continue PPI.  Chronic respiratory failure with hypoxia (HCC) -Continue home oxygen supplementation.  Adenocarcinoma of right lung, stage 4 -Continue outpatient follow-up with oncology service (Dr. Shirline Frees).  COPD with acute exacerbation (HCC) -As mentioned above continue treatment with bronchodilator management and nebulizer treatments -Pulm toiletry and the use of flutter valve has been encouraged -Steroids and empirical doxycycline for bronchiectasis provide -Follow clinical response and continue home oxygen supplementation.  HTN (hypertension) -Resume home antihypertensive regimen -Healthy/low-sodium diet discussed patient.      Advance Care Planning:   Code Status: Full Code   Consults: None  Family Communication: Family at bedside.  Severity of Illness: The appropriate  patient status for this patient is OBSERVATION. Observation status is judged to be reasonable and necessary in order to provide the required intensity of service to ensure the patient's safety. The patient's presenting symptoms, physical exam findings, and initial radiographic and laboratory data in the context of their medical condition is felt to place them at decreased risk for further clinical deterioration. Furthermore, it is anticipated that the patient will be medically stable for discharge from the hospital within 2 midnights of admission.   Author: Vassie Loll, MD 10/06/2022 7:34 PM  For on call review [email or web address].

## 2022-10-06 NOTE — Progress Notes (Signed)
   10/06/22 1607  Assess: MEWS Score  Temp 98.4 F (36.9 C)  BP 107/67  MAP (mmHg) 75  Pulse Rate 95  Resp (!) 26  Level of Consciousness Alert  SpO2 99 %  O2 Device Nasal Cannula  O2 Flow Rate (L/min) 4 L/min  Assess: MEWS Score  MEWS Temp 0  MEWS Systolic 0  MEWS Pulse 0  MEWS RR 2  MEWS LOC 0  MEWS Score 2  MEWS Score Color Yellow  Assess: if the MEWS score is Yellow or Red  Were vital signs taken at a resting state? Yes  Focused Assessment No change from prior assessment  Does the patient meet 2 or more of the SIRS criteria? Yes  Does the patient have a confirmed or suspected source of infection? No  MEWS guidelines implemented  No, previously yellow, continue vital signs every 4 hours  Notify: Charge Nurse/RN  Name of Charge Nurse/RN Notified Chales Abrahams, RN  Provider Notification  Provider Name/Title Gwenlyn Perking, MD  Date Provider Notified 10/06/22  Time Provider Notified [phone]  Method of Notification  (secure chat)  Notification Reason Other (Comment) (yellow mews on arrival to unit)  Provider response No new orders  Date of Provider Response 10/06/22  Time of Provider Response 1635  Assess: SIRS CRITERIA  SIRS Temperature  0  SIRS Pulse 1  SIRS Respirations  1  SIRS WBC 0  SIRS Score Sum  2

## 2022-10-06 NOTE — Assessment & Plan Note (Signed)
-  Continue outpatient follow-up with oncology service (Dr. Shirline Frees).

## 2022-10-06 NOTE — Assessment & Plan Note (Signed)
-  Resume home antihypertensive regimen -Healthy/low-sodium diet discussed patient.

## 2022-10-06 NOTE — Assessment & Plan Note (Signed)
Continue PPI ?

## 2022-10-06 NOTE — Assessment & Plan Note (Signed)
-  Appears multifactorial in the setting of COPD exacerbation and valvular heart disease decompensation (equivalent to acute on chronic diastolic heart failure). -Patient reports improvement with initiated treatment and has noticed decrease in her lower extremity swelling. -Continue IV diuretics, daily weights, strict I's and O's and low-sodium diet -Will also continue steroids management, mucolytic's, nebulizer therapy, oral antibiotics and the use of flutter valve.   -Continue to follow clinical response.   -TED hoses has been ordered.

## 2022-10-06 NOTE — ED Triage Notes (Signed)
Pt c/o bilateral ankle and foot swelling x 3 weeks. Pt has been soaking her legs, elevating her feet, and taking prescribed meds as ordered without resolution. 2+ pitting edema noted to both feet and ankles. Pt has baseline O2 requirement of 4L but notes that she has increased WOB for a couple of weeks. She has also had a little bit of right lower pleural pain. A/O x 4

## 2022-10-06 NOTE — ED Provider Notes (Signed)
EMERGENCY DEPARTMENT AT [facility] Provider Note   CSN: [id number] Arrival date & time: 10/06/22  1039     History  Chief Complaint  Patient presents with   Leg Swelling   HPI Courtney Zuniga is a 72 y.o. female with lung cancer, COPD, CAD and history of ACS presenting for leg swelling.  Started 3 weeks ago.  Swelling is bilateral and primarily around the ankles.  Has gotten progressively worse.  Endorses shortness of breath.  Patient is on 4 L nasal cannula at baseline.  Shortness of breath is worse with exertion.  Also endorses a month-long nonproductive cough.  Also states she has had some chest discomfort all about the lower chest.  It is worse with coughing.  Denies fever.  HPI     Home Medications Prior to Admission medications   Medication Sig Start Date End Date Taking? Authorizing Provider  acetaminophen (TYLENOL) 325 MG tablet Take 2 tablets (650 mg total) by mouth every 6 (six) hours as needed for mild pain, fever or headache. 05/08/18   Mariea Clonts, Courage, MD  albuterol (PROVENTIL) (2.5 MG/3ML) 0.083% nebulizer solution Take 3 mLs (2.5 mg total) by nebulization 3 (three) times daily as needed for shortness of breath or wheezing. 08/28/22   Shon Hale, MD  albuterol (VENTOLIN HFA) 108 (90 Base) MCG/ACT inhaler Inhale 2 puffs into the lungs every 4 (four) hours as needed for shortness of breath. 08/28/22   Shon Hale, MD  DULoxetine (CYMBALTA) 60 MG capsule Take 1 capsule (60 mg total) by mouth 2 (two) times daily. 08/28/22   Emokpae, Courage, MD  ENSURE (ENSURE) Take 237 mLs by mouth 3 (three) times daily between meals.    [provider]  FeFum-FePoly-FA-B Cmp-C-Biot (INTEGRA PLUS) CAPS Take 1 capsule by mouth every morning. 08/28/22   Emokpae, Courage, MD  fluticasone furoate-vilanterol (BREO ELLIPTA) 100-25 MCG/ACT AEPB Inhale 1 puff into the lungs daily. 08/28/22   Shon Hale, MD  guaiFENesin (MUCINEX) 600 MG 12 hr tablet Take 1  tablet (600 mg total) by mouth 2 (two) times daily. 08/28/22 08/28/23  Shon Hale, MD  Ipratropium-Albuterol (COMBIVENT RESPIMAT) 20-100 MCG/ACT AERS respimat Inhale 1 puff into the lungs every 6 (six) hours as needed for wheezing or shortness of breath. 08/28/22   Shon Hale, MD  megestrol (MEGACE ES) 625 MG/5ML suspension Take 5 mLs (625 mg total) by mouth daily. For appetite stimulation 08/28/22   Shon Hale, MD  Multiple Vitamins-Minerals (MULTIVITAMIN WITH MINERALS) tablet Take 1 tablet by mouth daily. 08/28/22 08/28/23  Shon Hale, MD  oxyCODONE-acetaminophen (PERCOCET/ROXICET) 5-325 MG tablet Take 1 tablet by mouth every 8 (eight) hours as needed for moderate pain. 05/08/18   Shon Hale, MD  pantoprazole (PROTONIX) 40 MG tablet Take 1 tablet (40 mg total) by mouth 2 (two) times daily. 08/28/22 08/28/23  Shon Hale, MD  prochlorperazine (COMPAZINE) 10 MG tablet Take 1 tablet (10 mg total) by mouth every 6 (six) hours as needed for nausea or vomiting. 08/28/22   Shon Hale, MD  tamsulosin (FLOMAX) 0.4 MG CAPS capsule Take 1 capsule (0.4 mg total) by mouth daily after supper. 08/28/22   Shon Hale, MD  topiramate (TOPAMAX) 25 MG tablet Take 1 tablet (25 mg total) by mouth 2 (two) times daily. 08/28/22   Shon Hale, MD  traZODone (DESYREL) 50 MG tablet Take 1 tablet (50 mg total) by mouth at bedtime. 08/28/22   Shon Hale, MD  TRELEGY ELLIPTA 100-62.5-25 MCG/ACT AEPB Inhale 1 puff into the  lungs daily. 08/28/22   Shon Hale, MD  Vitamin D, Ergocalciferol, (DRISDOL) 1.25 MG (50000 UNIT) CAPS capsule Take 1 capsule (50,000 Units total) by mouth once a week. 08/28/22   Shon Hale, MD  HYDROcodone bit-homatropine (HYCODAN) 5-1.5 MG/5ML syrup Take 5 mLs by mouth every 6 (six) hours as needed for cough. 11/10/21   Si Gaul, MD      Allergies    Desyrel [trazodone]    Review of Systems   See HPI for pertinent positives   Physical Exam    Vitals:   10/06/22 1300 10/06/22 1524  BP:  120/67  Pulse: 86 89  Resp: 20 (!) 26  Temp:    SpO2: 100% 100%    CONSTITUTIONAL:  well-appearing, NAD NEURO:  Alert and oriented x 3, CN 3-12 grossly intact EYES:  eyes equal and reactive ENT/NECK:  Supple, no stridor  CARDIO:  Regular rate and rhythm, appears well-perfused  PULM:  No respiratory distress, coarse and wheezing.  No crackles. GI/GU:  non-distended, soft MSK/SPINE:  No gross deformities, no edema, moves all extremities  SKIN:  no rash, atraumatic  *Additional and/or pertinent findings included in MDM below  ED Results / Procedures / Treatments   Labs (all labs ordered are listed, but only abnormal results are displayed) Labs Reviewed  BASIC METABOLIC PANEL - Abnormal; Notable for the following components:      Result Value   Glucose, Bld 123 (*)    Calcium 8.6 (*)    All other components within normal limits  BRAIN NATRIURETIC PEPTIDE - Abnormal; Notable for the following components:   B Natriuretic Peptide 179.0 (*)    All other components within normal limits  CBC WITH DIFFERENTIAL/PLATELET - Abnormal; Notable for the following components:   Hemoglobin 10.6 (*)    HCT 34.0 (*)    MCV 77.1 (*)    MCH 24.0 (*)    RDW 27.1 (*)    All other components within normal limits  D-DIMER, QUANTITATIVE - Abnormal; Notable for the following components:   D-Dimer, Quant 1.66 (*)    All other components within normal limits  RESP PANEL BY RT-PCR (RSV, FLU A&B, COVID)  RVPGX2  MAGNESIUM  PHOSPHORUS  TSH  TROPONIN I (HIGH SENSITIVITY)  TROPONIN I (HIGH SENSITIVITY)    EKG EKG Interpretation  Date/Time:  Monday October 06 2022 11:33:17 EDT Ventricular Rate:  93 PR Interval:  179 QRS Duration: 78 QT Interval:  360 QTC Calculation: 448 R Axis:   80 Text Interpretation: Sinus rhythm ventricular premature complexes Anteroseptal infarct, age indeterminate Confirmed by Gloris Manchester [phone]) on 10/06/2022 11:58:33  AM  Radiology US Venous Img Lower Bilateral (DVT)  Result Date: 10/06/2022 CLINICAL DATA:  Ankle and feet swelling EXAM: BILATERAL LOWER EXTREMITY VENOUS DOPPLER ULTRASOUND TECHNIQUE: Gray-scale sonography with compression, as well as color and duplex ultrasound, were performed to evaluate the deep venous system(s) from the level of the common femoral vein through the popliteal and proximal calf veins. COMPARISON:  None Available. FINDINGS: VENOUS Normal compressibility of the bilateral common femoral, superficial femoral, and popliteal veins, as well as the visualized calf veins. Visualized portions of profunda femoral vein and great saphenous vein unremarkable. No filling defects to suggest DVT on grayscale or color Doppler imaging. Doppler waveforms show normal direction of venous flow, normal respiratory plasticity and response to augmentation. OTHER None. Limitations: none IMPRESSION: Negative. Electronically Signed   By: Wiliam Ke M.D.   On: 10/06/2022 13:24   DG Chest 2 View  Result Date: 10/06/2022 CLINICAL DATA:  Shortness of breath EXAM: CHEST - 2 VIEW COMPARISON:  X-ray 08/28/2022 FINDINGS: Hyperinflation. There is some linear opacity at the bases likely scar or atelectasis. No pneumothorax. Interstitial changes identified with underlying chronic lung changes. There is focal masslike opacity at the right lung hilum, unchanged from previous. Separate spiculated nodule in the medial right lung apex. Tiny bilateral effusion. Normal cardiopericardial silhouette. Calcified aorta. Right IJ chest port. Overlapping cardiac leads IMPRESSION: Hyperinflation.  Chronic lung changes. Again noted made is a spiculated right upper lobe nodule and some bandlike opacity, fullness of the right lung hilum, perihilar region, unchanged from previous. Tiny pleural effusions. Please correlate with prior CT angiogram of the chest from 08/25/2022. Chest port Electronically Signed   By: Karen Kays M.D.   On:  10/06/2022 12:11    Procedures Procedures    Medications Ordered in ED Medications  albuterol (VENTOLIN HFA) 108 (90 Base) MCG/ACT inhaler 2 puff (has no administration in time range)  enoxaparin (LOVENOX) injection 40 mg (has no administration in time range)  sodium chloride flush (NS) 0.9 % injection 3 mL (has no administration in time range)  sodium chloride flush (NS) 0.9 % injection 3 mL (has no administration in time range)  0.9 %  sodium chloride infusion (has no administration in time range)  acetaminophen (TYLENOL) tablet 650 mg (has no administration in time range)    Or  acetaminophen (TYLENOL) suppository 650 mg (has no administration in time range)  ondansetron (ZOFRAN) tablet 4 mg (has no administration in time range)    Or  ondansetron (ZOFRAN) injection 4 mg (has no administration in time range)  furosemide (LASIX) injection 20 mg (has no administration in time range)  methylPREDNISolone sodium succinate (SOLU-MEDROL) 40 mg/mL injection 40 mg (has no administration in time range)  budesonide (PULMICORT) nebulizer solution 0.5 mg (has no administration in time range)  arformoterol (BROVANA) nebulizer solution 15 mcg (has no administration in time range)  ipratropium-albuterol (DUONEB) 0.5-2.5 (3) MG/3ML nebulizer solution 3 mL (has no administration in time range)  doxycycline (VIBRA-TABS) tablet 100 mg (has no administration in time range)  dextromethorphan-guaiFENesin (MUCINEX DM) 30-600 MG per 12 hr tablet 1 tablet (has no administration in time range)  DULoxetine (CYMBALTA) DR capsule 60 mg (has no administration in time range)  megestrol (MEGACE ES) 625 MG/5ML suspension 625 mg (has no administration in time range)  Ensure liquid 237 mL (has no administration in time range)  multivitamin with minerals tablet 1 tablet (has no administration in time range)  oxyCODONE-acetaminophen (PERCOCET/ROXICET) 5-325 MG per tablet 1 tablet (has no administration in time range)   pantoprazole (PROTONIX) EC tablet 40 mg (has no administration in time range)  topiramate (TOPAMAX) tablet 25 mg (has no administration in time range)  tamsulosin (FLOMAX) capsule 0.4 mg (has no administration in time range)  traZODone (DESYREL) tablet 50 mg (has no administration in time range)  ipratropium-albuterol (DUONEB) 0.5-2.5 (3) MG/3ML nebulizer solution 3 mL (3 mLs Nebulization Given 10/06/22 1159)  furosemide (LASIX) injection 20 mg (20 mg Intravenous Given 10/06/22 1323)  potassium chloride SA (KLOR-CON M) CR tablet 20 mEq (20 mEq Oral Given 10/06/22 1323)  methylPREDNISolone sodium succinate (SOLU-MEDROL) 125 mg/2 mL injection 125 mg (125 mg Intravenous Given 10/06/22 1323)    ED Course/ Medical Decision Making/ A&P Clinical Course as of 10/06/22 1543  Mon Oct 06, 2022  1452 BMI (Calculated): 18.73 [JR]    Clinical Course User Index [JR] Gareth Eagle, PA-C  Medical Decision Making Amount and/or Complexity of Data Reviewed Labs: ordered. Radiology: ordered.  Risk Prescription drug management. Decision regarding hospitalization.   Initial Impression and Ddx 72 year old well-appearing female presenting for shortness of breath and lower extremity edema.  Exam notable for lower extremity edema and tachypnea on 4 L.  DDx includes ACS, PE, COPD exacerbation, CHF exacerbation, DVT, anemia, sequela of her lung cancer, pneumonia. Patient PMH that increases complexity of ED encounter:  ung cancer, COPD, CAD and history of ACS  Interpretation of Diagnostics I independent reviewed and interpreted the labs as followed: Elevated D-dimer, elevated BNP and anemia (improved from last check)  - I independently visualized the following imaging with scope of interpretation limited to determining acute life threatening conditions related to emergency care: Lower extremity ultrasound was negative, chest x-ray revealed tiny pleural effusions  -I personally  reviewed and interpreted EKG which revealed sinus rhythm  Patient Reassessment and Ultimate Disposition/Management Primary concern was a combination of COPD and CHF exacerbation.  Patient improved minimally after treatment.  D-dimer was also elevated.  Prompted further characterization with VQ scan.  Admitted to hospital service for continued management with Dr. Gwenlyn Perking.  Patient management required discussion with the following services or consulting groups:  Hospitalist Service  Complexity of Problems Addressed Acute complicated illness or Injury  Additional Data Reviewed and Analyzed Further history obtained from: Further history from spouse/family member, Past medical history and medications listed in the EMR, and Prior ED visit notes  Patient Encounter Risk Assessment Consideration of hospitalization         Final Clinical Impression(s) / ED Diagnoses Final diagnoses:  Bilateral lower extremity edema  Shortness of breath    Rx / DC Orders ED Discharge Orders     None         Gareth Eagle, PA-C 10/06/22 1543    Gloris Manchester, MD 10/07/22 0930

## 2022-10-07 DIAGNOSIS — Z9221 Personal history of antineoplastic chemotherapy: Secondary | ICD-10-CM | POA: Diagnosis not present

## 2022-10-07 DIAGNOSIS — Z888 Allergy status to other drugs, medicaments and biological substances status: Secondary | ICD-10-CM | POA: Diagnosis not present

## 2022-10-07 DIAGNOSIS — Z9049 Acquired absence of other specified parts of digestive tract: Secondary | ICD-10-CM | POA: Diagnosis not present

## 2022-10-07 DIAGNOSIS — C3411 Malignant neoplasm of upper lobe, right bronchus or lung: Secondary | ICD-10-CM | POA: Diagnosis present

## 2022-10-07 DIAGNOSIS — Z95828 Presence of other vascular implants and grafts: Secondary | ICD-10-CM | POA: Diagnosis not present

## 2022-10-07 DIAGNOSIS — Z8042 Family history of malignant neoplasm of prostate: Secondary | ICD-10-CM | POA: Diagnosis not present

## 2022-10-07 DIAGNOSIS — Z801 Family history of malignant neoplasm of trachea, bronchus and lung: Secondary | ICD-10-CM | POA: Diagnosis not present

## 2022-10-07 DIAGNOSIS — I5033 Acute on chronic diastolic (congestive) heart failure: Secondary | ICD-10-CM

## 2022-10-07 DIAGNOSIS — C3491 Malignant neoplasm of unspecified part of right bronchus or lung: Secondary | ICD-10-CM | POA: Diagnosis not present

## 2022-10-07 DIAGNOSIS — I110 Hypertensive heart disease with heart failure: Secondary | ICD-10-CM | POA: Diagnosis present

## 2022-10-07 DIAGNOSIS — Z86011 Personal history of benign neoplasm of the brain: Secondary | ICD-10-CM | POA: Diagnosis not present

## 2022-10-07 DIAGNOSIS — J479 Bronchiectasis, uncomplicated: Secondary | ICD-10-CM | POA: Diagnosis present

## 2022-10-07 DIAGNOSIS — J9611 Chronic respiratory failure with hypoxia: Secondary | ICD-10-CM

## 2022-10-07 DIAGNOSIS — Z79899 Other long term (current) drug therapy: Secondary | ICD-10-CM | POA: Diagnosis not present

## 2022-10-07 DIAGNOSIS — I252 Old myocardial infarction: Secondary | ICD-10-CM | POA: Diagnosis not present

## 2022-10-07 DIAGNOSIS — K219 Gastro-esophageal reflux disease without esophagitis: Secondary | ICD-10-CM

## 2022-10-07 DIAGNOSIS — Z8041 Family history of malignant neoplasm of ovary: Secondary | ICD-10-CM | POA: Diagnosis not present

## 2022-10-07 DIAGNOSIS — I2510 Atherosclerotic heart disease of native coronary artery without angina pectoris: Secondary | ICD-10-CM | POA: Diagnosis present

## 2022-10-07 DIAGNOSIS — Z87891 Personal history of nicotine dependence: Secondary | ICD-10-CM | POA: Diagnosis not present

## 2022-10-07 DIAGNOSIS — J441 Chronic obstructive pulmonary disease with (acute) exacerbation: Secondary | ICD-10-CM | POA: Diagnosis present

## 2022-10-07 DIAGNOSIS — Z1152 Encounter for screening for COVID-19: Secondary | ICD-10-CM | POA: Diagnosis not present

## 2022-10-07 DIAGNOSIS — I10 Essential (primary) hypertension: Secondary | ICD-10-CM

## 2022-10-07 DIAGNOSIS — Z9981 Dependence on supplemental oxygen: Secondary | ICD-10-CM | POA: Diagnosis not present

## 2022-10-07 DIAGNOSIS — D509 Iron deficiency anemia, unspecified: Secondary | ICD-10-CM | POA: Diagnosis present

## 2022-10-07 DIAGNOSIS — F32A Depression, unspecified: Secondary | ICD-10-CM | POA: Diagnosis present

## 2022-10-07 DIAGNOSIS — R0602 Shortness of breath: Secondary | ICD-10-CM | POA: Diagnosis present

## 2022-10-07 DIAGNOSIS — Z7951 Long term (current) use of inhaled steroids: Secondary | ICD-10-CM | POA: Diagnosis not present

## 2022-10-07 LAB — CBC
HCT: 31.8 % — ABNORMAL LOW (ref 36.0–46.0)
Hemoglobin: 9.9 g/dL — ABNORMAL LOW (ref 12.0–15.0)
MCH: 23.6 pg — ABNORMAL LOW (ref 26.0–34.0)
MCHC: 31.1 g/dL (ref 30.0–36.0)
MCV: 75.9 fL — ABNORMAL LOW (ref 80.0–100.0)
Platelets: 256 10*3/uL (ref 150–400)
RBC: 4.19 MIL/uL (ref 3.87–5.11)
RDW: 26.5 % — ABNORMAL HIGH (ref 11.5–15.5)
WBC: 5.4 10*3/uL (ref 4.0–10.5)
nRBC: 0 % (ref 0.0–0.2)

## 2022-10-07 LAB — BASIC METABOLIC PANEL
Anion gap: 6 (ref 5–15)
BUN: 17 mg/dL (ref 8–23)
CO2: 30 mmol/L (ref 22–32)
Calcium: 8.8 mg/dL — ABNORMAL LOW (ref 8.9–10.3)
Chloride: 103 mmol/L (ref 98–111)
Creatinine, Ser: 0.59 mg/dL (ref 0.44–1.00)
GFR, Estimated: >60 mL/min (ref >60)
Glucose, Bld: 91 mg/dL (ref 70–99)
Potassium: 3.8 mmol/L (ref 3.5–5.1)
Sodium: 139 mmol/L (ref 135–145)

## 2022-10-07 MED ORDER — SENNOSIDES-DOCUSATE SODIUM 8.6-50 MG PO TABS
1.0000 | ORAL_TABLET | Freq: Two times a day (BID) | ORAL | Status: DC
  Administered 2022-10-07 – 2022-10-08 (×2): 1 via ORAL
  Filled 2022-10-07 (×2): qty 1

## 2022-10-07 MED ORDER — POLYETHYLENE GLYCOL 3350 17 G PO PACK
17.0000 g | PACK | Freq: Every day | ORAL | Status: DC | PRN
  Administered 2022-10-07: 17 g via ORAL
  Filled 2022-10-07: qty 1

## 2022-10-07 NOTE — TOC Initial Note (Signed)
Transition of Care [facility]) - Initial/Assessment Note    Patient Details  Name: Courtney Zuniga MRN: [id number] Date of Birth: [birth year]  Transition of Care [facility]) CM/SW Contact:    Annice Needy, LCSW Phone Number: 10/07/2022, 12:08 PM  Clinical Narrative:                 Patient in obs. Admitted for SOB. Assessed last month, see below:  Pt lives with her son. She requires some assist with bathing, but is otherwise fairly independent with ADLs. Pt is on 4L home O2 ([employer]). She uses [employer] for transportation  Barriers to Discharge: Continued Medical Work up   Patient Goals and CMS Choice Patient states their goals for this hospitalization and ongoing recovery are:: patient is obs          Expected Discharge Plan and Services                                              Prior Living Arrangements/Services                       Activities of Daily Living Home Assistive Devices/Equipment: Dentures (specify type), Eyeglasses, Oxygen, Nebulizer, Shower chair without back, Walker (specify type), Bedside commode/3-in-1 ADL Screening (condition at time of admission) Patient's cognitive ability adequate to safely complete daily activities?: Yes Is the patient deaf or have difficulty hearing?: No Does the patient have difficulty seeing, even when wearing glasses/contacts?: No Does the patient have difficulty concentrating, remembering, or making decisions?: No Patient able to express need for assistance with ADLs?: Yes Does the patient have difficulty dressing or bathing?: No Independently performs ADLs?: Yes (appropriate for developmental age) Does the patient have difficulty walking or climbing stairs?: Yes Weakness of Legs: Both Weakness of Arms/Hands: None  Permission Sought/Granted                  Emotional Assessment         Alcohol / Substance Use: Not Applicable Psych Involvement: No (comment)  Admission diagnosis:  Shortness of breath  [R06.02] SOB (shortness of breath) [R06.02] Bilateral lower extremity edema [R60.0] Patient Active Problem List   Diagnosis Date Noted   SOB (shortness of breath) 10/06/2022   Acute on chronic respiratory failure with hypoxia (HCC) 08/25/2022   Pleural effusion 08/25/2022   Hypotension 06/12/2022   Iron deficiency anemia 06/12/2022   Acute on chronic anemia 05/20/2022   Gastric ulcer 05/20/2022   AVM (arteriovenous malformation) of small bowel, 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   Upper GI bleed 05/18/2022   Acute blood loss anemia 05/18/2022   Acute respiratory failure with hypoxia (HCC) 04/23/2022   Influenza B 04/23/2022   Frequent PVCs 04/09/2022   Port-A-Cath in place 03/03/2022   Increased frequency of headaches 10/18/2020   COPD exacerbation (HCC) 11/05/2019   Chronic nonintractable headache    Acute on chronic respiratory failure with hypoxia and hypercapnia (HCC) 06/16/2018   Influenza A 06/16/2018   Severe protein-calorie malnutrition (HCC) 06/16/2018   GERD (gastroesophageal reflux disease) 06/16/2018   Bilateral pneumonia 05/05/2018   Sepsis due to CAP 05/05/2018   Chronic respiratory failure with hypoxia (HCC) 05/05/2018   Encounter for antineoplastic immunotherapy 04/23/2017   Depression 05/15/2016   Adenocarcinoma of right lung, stage 4 01/10/2016   Encounter for antineoplastic chemotherapy 01/10/2016   Lung mass 01/03/2016   Arthritis  Subepithelial gastric mass    Mucosal abnormality of stomach    Abdominal pain 08/08/2015   Abnormal CT of the abdomen 08/08/2015   Influenza with pneumonia 06/27/2014   Hypoxia 06/26/2014   COPD with acute exacerbation (HCC) 06/26/2014   CAP (community acquired pneumonia)    Chest pain of uncertain etiology    Asthma, chronic    Chest pain 02/16/2014   HTN (hypertension) 02/16/2014   DDD (degenerative disc disease), lumbar 02/04/2011   Lumbar herniated disc 01/09/2011   PCP:  Oneal Grout, FNP Pharmacy:   [facility] - [place], [place] - [address] [place] [address] Phone: [phone] Fax: [phone]  [facility] - [place], [place] - [address] [place] [address] Phone: [phone] Fax: [phone]     Social Determinants of Health (SDOH) Social History: SDOH Screenings   Food Insecurity: No Food Insecurity (10/06/2022)  Housing: Low Risk  (10/06/2022)  Transportation Needs: No Transportation Needs (10/06/2022)  Utilities: Not At Risk (10/06/2022)  Financial Resource Strain: Low Risk  (05/05/2018)  Physical Activity: Insufficiently Active (05/05/2018)  Social Connections: Somewhat Isolated (05/05/2018)  Stress: No Stress Concern Present (05/05/2018)  Tobacco Use: Medium Risk (10/06/2022)   SDOH Interventions:     Readmission Risk Interventions    08/26/2022   10:43 AM 05/19/2022   12:12 PM 04/26/2022    1:03 PM  Readmission Risk Prevention Plan  Transportation Screening Complete Complete Complete  HRI or Home Care Consult  Complete Complete  Social Work Consult for Recovery Care Planning/Counseling  Complete Complete  Palliative Care Screening  Not Applicable Not Applicable  Medication Review Oceanographer) Complete Complete Complete  HRI or Home Care Consult Complete    SW Recovery Care/Counseling Consult Complete    Palliative Care Screening Not Applicable    Skilled Nursing Facility Not Applicable

## 2022-10-07 NOTE — Evaluation (Signed)
Physical Therapy Evaluation Patient Details Name: Courtney Zuniga MRN: [id number] DOB: [birth year] Today's Date: 10/07/2022  History of Present Illness  Courtney Zuniga is a 72 y.o. female with medical history significant of COPD, depression, hypertension, lung cancer, chronic respiratory failure using 4 L nasal cannula supplementation at baseline and history of coronary disease; presented to the hospital secondary to lower extremity swelling, shortness of breath and tachypnea.  Patient reports symptom has been present for the last 3-4 weeks and worsening.  She reported no fever, no nausea, no vomiting, no sick contacts or any abdominal discomfort.  Patient expressed pleuritic right-sided lower chest discomfort present for the last week or so.  After further discussion patient denies orthopnea dyspnea on exertion and lack of improvement with home inhalers.   Clinical Impression  Patient functioning near baseline for functional mobility and gait other than having to take sitting rest break before returning to room due to SOB with SpO2 dropping from 100% to 85% during ambulation while on 4 LPM O2.  Patient had to sit for approximate 3-4 minutes then able to walk back to room with SpO2 at 100%.  Patient tolerated sitting up in chair after therapy.  Plan:  Patient discharged from physical therapy to care of nursing for ambulation daily as tolerated for length of stay.          Recommendations for follow up therapy are one component of a multi-disciplinary discharge planning process, led by the attending physician.  Recommendations may be updated based on patient status, additional functional criteria and insurance authorization.  Follow Up Recommendations       Assistance Recommended at Discharge PRN  Patient can return home with the following  Help with stairs or ramp for entrance    Equipment Recommendations None recommended by PT  Recommendations for Other Services       Functional Status  Assessment Patient has had a recent decline in their functional status and/or demonstrates limited ability to make significant improvements in function in a reasonable and predictable amount of time     Precautions / Restrictions Precautions Precautions: None Restrictions Weight Bearing Restrictions: No      Mobility  Bed Mobility Overal bed mobility: Modified Independent                  Transfers Overall transfer level: Modified independent                      Ambulation/Gait Ambulation/Gait assistance: Modified independent (Device/Increase time) Gait Distance (Feet): 150 Feet Assistive device: None Gait Pattern/deviations: WFL(Within Functional Limits) Gait velocity: slightly decreased     General Gait Details: grossly WFL with good return for ambulating in room/hallways without loss of balance, had to take a sitting rest break before returning to room due to SOB, on 4 LPM with SpO2 dropping from 100% to 85%, recovered to 100% after sitting  Stairs            Wheelchair Mobility    Modified Rankin (Stroke Patients Only)       Balance Overall balance assessment: No apparent balance deficits (not formally assessed)                                           Pertinent Vitals/Pain Pain Assessment Pain Assessment: No/denies pain    Home Living Family/patient expects to be discharged to:: Private residence Living Arrangements:  Children Available Help at Discharge: Family Type of Home: House Home Access: Stairs to enter Entrance Stairs-Rails: Left Entrance Stairs-Number of Steps: 3   Home Layout: One level Home Equipment: Agricultural consultant (2 wheels);Shower seat Additional Comments: 4 LPM O2 at home    Prior Function Prior Level of Function : Independent/Modified Independent             Mobility Comments: Household and short distanced community ambulator without use of AD, on 4 LPM O2 at home ADLs Comments: Assisted  by family     Hand Dominance   Dominant Hand: Right    Extremity/Trunk Assessment   Upper Extremity Assessment Upper Extremity Assessment: Overall WFL for tasks assessed    Lower Extremity Assessment Lower Extremity Assessment: Overall WFL for tasks assessed    Cervical / Trunk Assessment Cervical / Trunk Assessment: Normal  Communication   Communication: No difficulties  Cognition Arousal/Alertness: Awake/alert Behavior During Therapy: WFL for tasks assessed/performed Overall Cognitive Status: Within Functional Limits for tasks assessed                                          General Comments      Exercises     Assessment/Plan    PT Assessment Patient does not need any further PT services  PT Problem List         PT Treatment Interventions      PT Goals (Current goals can be found in the Care Plan section)  Acute Rehab PT Goals Patient Stated Goal: return home with family to assist PT Goal Formulation: With patient Time For Goal Achievement: 10/07/22 Potential to Achieve Goals: Good    Frequency       Co-evaluation               AM-PAC PT "6 Clicks" Mobility  Outcome Measure Help needed turning from your back to your side while in a flat bed without using bedrails?: None Help needed moving from lying on your back to sitting on the side of a flat bed without using bedrails?: None Help needed moving to and from a bed to a chair (including a wheelchair)?: None Help needed standing up from a chair using your arms (e.g., wheelchair or bedside chair)?: None Help needed to walk in hospital room?: None Help needed climbing 3-5 steps with a railing? : A Little 6 Click Score: 23    End of Session Equipment Utilized During Treatment: Oxygen Activity Tolerance: Patient tolerated treatment well;Patient limited by fatigue Patient left: in chair;with call bell/phone within reach Nurse Communication: Mobility status PT Visit Diagnosis:  Unsteadiness on feet (R26.81);Other abnormalities of gait and mobility (R26.89);Muscle weakness (generalized) (M62.81)    Time: 7829-5621 PT Time Calculation (min) (ACUTE ONLY): 24 min   Charges:   PT Evaluation $PT Eval Moderate Complexity: 1 Mod PT Treatments $Therapeutic Activity: 23-37 mins        3:49 PM, 10/07/22 Ocie Bob, MPT Physical Therapist with [facility] 336 [phone] office [phone] mobile phone

## 2022-10-07 NOTE — Care Management Obs Status (Signed)
MEDICARE OBSERVATION STATUS NOTIFICATION   Patient Details  Name: Courtney Zuniga MRN: [id number] Date of Birth: 1950-06-12   Medicare Observation Status Notification Given:  Yes    Corey Harold 10/07/2022, 4:16 PM

## 2022-10-07 NOTE — Progress Notes (Signed)
Progress Note   Patient: Courtney Zuniga BMW:[id number] DOB: [birth year] DOA: 10/06/2022     0 DOS: the patient was seen and examined on 10/07/2022   Brief hospital course: Courtney Zuniga is a 72 y.o. female with medical history significant of COPD, depression, hypertension, lung cancer, chronic respiratory failure using 4 L nasal cannula supplementation at baseline and history of coronary disease; presented to the hospital secondary to lower extremity swelling, shortness of breath and tachypnea.  Patient reports symptom has been present for the last 3-4 weeks and worsening.  She reported no fever, no nausea, no vomiting, no sick contacts or any abdominal discomfort.  Patient expressed pleuritic right-sided lower chest discomfort present for the last week or so. After further discussion patient denies orthopnea dyspnea on exertion and lack of improvement with home inhalers.   Chest x-ray in the ED demonstrating chronic bronchitic changes and the presence of a spiculated right upper lobe and some bandlike opacity with fullness of the right lung healing; there is a perihilar region unchanged from previous.  Chest Port-A-Cath in place.  Lower extremity Dopplers negative for DVT.  Positive D-dimer at 1.66 negative VQ scan.   Respiratory panel negative for COVID no answer.   Positive expiratory wheezing and fine crackles appreciated on exam; patient received Lasix, steroids and nebulizer management in the ED with some improvement in her symptoms.  TRH contacted to place in the hospital for further evaluation and management what appears to be COPD and CHF exacerbation.  Assessment and Plan: * SOB (shortness of breath) -Appears multifactorial in the setting of COPD exacerbation and valvular heart disease decompensation (equivalent to acute on chronic diastolic heart failure). -Patient reports improvement with initiated treatment and has noticed decrease in her lower extremity swelling. -Continue IV  diuretics, daily weights, strict I's and O's and low-sodium diet -Will also continue steroids management, mucolytic's, nebulizer therapy, oral antibiotics and the use of flutter valve.   -Continue to follow clinical response.   -TED hoses has been ordered.    GERD (gastroesophageal reflux disease) -Continue PPI.  Chronic respiratory failure with hypoxia (HCC) -Continue home oxygen supplementation.  Adenocarcinoma of right lung, stage 4 -Continue outpatient follow-up with oncology service (Dr. Shirline Frees).  COPD with acute exacerbation (HCC) -As mentioned above continue treatment with bronchodilator management and nebulizer treatments -Flutter valve has been encouraged -Continue treatment with the steroids and oral doxycycline empirically for bronchiectasis. -Follow clinical response and continue home oxygen supplementation (good saturation appreciated with chronic supplementation).Marland Kitchen  HTN (hypertension) -Heart healthy diet discussed with patient -Continue current antihypertensive agents.   Subjective:  Afebrile, no chest pain, no nausea or vomiting.  Patient demonstrating mild difficulty speaking in full sentences, complaining of intermittent productive coughing spells and short winded sensation with activity.  Good saturation on chronic 4 L supplementation.  Physical Exam: Vitals:   10/07/22 0027 10/07/22 0431 10/07/22 0822 10/07/22 0826  BP: 109/71 115/74    Pulse: 100     Resp: (!) 26 (!) 22    Temp: 98.2 F (36.8 C) 98.8 F (37.1 C)    TempSrc: Oral Oral    SpO2: 99% 100% 99% 100%  Weight:  54 kg    Height:       General exam: Alert, awake, oriented x 3; chronically ill in appearance; in no acute distress and expressing feeling weak and tired.  Respiratory status improving and patient expressing intermittent productive coughing spells and short winded sensation.  Significant improvement in her lower extremity swelling appreciated. Respiratory system: Pulse  scattered  rhonchi, expiratory wheezing and fine crackles appreciated at the bases.  No using accessory muscle.  Positive tachypnea with exertion has been documented by nursing staff. Cardiovascular system:RRR. No rubs or gallops. Gastrointestinal system: Abdomen is nondistended, soft and nontender. No organomegaly or masses felt. Normal bowel sounds heard. Central nervous system: Generally weak; no focal deficit. Extremities: No cyanosis or clubbing; trace edema appreciated bilaterally. Skin: No petechiae. Psychiatry: Judgement and insight appear normal. Mood & affect appropriate.   Data Reviewed: CBC: WBCs 5.4, hemoglobin 9.9 and platelet count 256 K TSH: 1.897 Basic metabolic panel: Sodium 139, potassium 3.8, chloride 103, bicarb 30, BUN 17, creatinine 0.59 and GFR >60  Family Communication: No family at bedside.  Disposition: Status is: Observation The patient will require care spanning > 2 midnights and should be moved to inpatient because: Continue treatment with IV steroids, nebulizer management and IV diuresis.   Planned Discharge Destination: Patient prior to admission was receiving home health services; per nursing report family was interested in short-term rehabilitation at time of discharge.  Physical therapy evaluation requested.   Time spent: 40 minutes  Author: Vassie Loll, MD 10/07/2022 2:21 PM  For on call review [email or web address].

## 2022-10-08 DIAGNOSIS — J441 Chronic obstructive pulmonary disease with (acute) exacerbation: Secondary | ICD-10-CM | POA: Diagnosis not present

## 2022-10-08 DIAGNOSIS — C3491 Malignant neoplasm of unspecified part of right bronchus or lung: Secondary | ICD-10-CM | POA: Diagnosis not present

## 2022-10-08 DIAGNOSIS — J9611 Chronic respiratory failure with hypoxia: Secondary | ICD-10-CM | POA: Diagnosis not present

## 2022-10-08 LAB — BASIC METABOLIC PANEL
Anion gap: 8 (ref 5–15)
BUN: 26 mg/dL — ABNORMAL HIGH (ref 8–23)
CO2: 32 mmol/L (ref 22–32)
Calcium: 8.9 mg/dL (ref 8.9–10.3)
Chloride: 99 mmol/L (ref 98–111)
Creatinine, Ser: 0.59 mg/dL (ref 0.44–1.00)
GFR, Estimated: >60 mL/min (ref >60)
Glucose, Bld: 89 mg/dL (ref 70–99)
Potassium: 4.1 mmol/L (ref 3.5–5.1)
Sodium: 139 mmol/L (ref 135–145)

## 2022-10-08 MED ORDER — PREDNISONE 50 MG PO TABS: 50.0000 mg | ORAL_TABLET | Freq: Every day | ORAL | 0 refills | Status: DC

## 2022-10-08 MED ORDER — DOXYCYCLINE HYCLATE 100 MG PO TABS: 100.0000 mg | ORAL_TABLET | Freq: Two times a day (BID) | ORAL | 0 refills | Status: DC

## 2022-10-08 MED ORDER — PREDNISONE 20 MG PO TABS: 50.0000 mg | ORAL_TABLET | Freq: Every day | ORAL | Status: DC

## 2022-10-08 MED ORDER — FUROSEMIDE 20 MG PO TABS: 20.0000 mg | ORAL_TABLET | Freq: Every day | ORAL | 0 refills | Status: DC | PRN

## 2022-10-08 NOTE — Discharge Summary (Signed)
Physician Discharge Summary   Patient: Courtney Zuniga MRN: [id number] DOB: October 18, 1950  Admit date:     10/06/2022  Discharge date: 10/08/22  Discharge Physician: Onalee Hua Wanza Szumski   PCP: Oneal Grout, FNP   Recommendations at discharge:   Please follow up with primary care provider within 1-2 weeks  Please repeat BMP and CBC in one week    Hospital Course: 72 year old female with history of recurrent non-small cell lung cancer, chronic shortness of breath, chronic hypoxic respiratory failure, oxygen dependent on 4 L/min, iron deficiency anemia, coronary artery disease with history of NSTEMI, history of gastric ulcers, history of duodenal AVMs, history of GI bleeding, has been followed and treated by Dr. Shirline Frees with oncology and currently under surveillance and chemotherapy has been on hold due to problems with hypotension and anemia.  presented to the hospital secondary to lower extremity swelling and shortness of breath. Patient reports symptom has been present for the last 3-4 weeks and worsening.  She reported no fever, no nausea, no vomiting, no sick contacts or any abdominal discomfort.  Patient expressed pleuritic right-sided lower chest discomfort present for the last week or so. After further discussion patient denies orthopnea dyspnea on exertion and lack of improvement with home inhalers.   Chest x-ray in the ED demonstrating chronic bronchitic changes and the presence of a spiculated right upper lobe and some bandlike opacity with fullness of the right lung healing; there is a perihilar region unchanged from previous.  Chest Port-A-Cath in place.  Lower extremity Dopplers negative for DVT.  Positive D-dimer at 1.66 negative VQ scan.   Respiratory panel negative for COVID no answer.   Positive expiratory wheezing and fine crackles appreciated on exam; patient received Lasix, steroids and nebulizer management in the ED with some improvement in her symptoms.  TRH contacted to place in the  hospital for further evaluation and management what appears to be COPD and CHF exacerbation.    Assessment and Plan: COPD exacerbation -started on brovana, pulmicort, duonebs -started on IV solumedrol -improved, back to baseline -d/c home with prednisone x 3 more days -d/c home with doxy x 3 more days  Chronic respiratory failure with hypoxia -chronically on 4 L at home  Acute on chronic diastolic CHF -04/18/22 Echo EF 65-70%, no WMA, normal RVF -Patient reports improvement with initiated treatment and has noticed decrease in her lower extremity swelling. -Continue IV diuretics, daily weights, strict I's and O's and low-sodium diet -Continue to follow clinical response.    -d/c home with prn lasix for 3 lb weight gain   GERD (gastroesophageal reflux disease) -Continue PPI.   Adenocarcinoma of right lung, stage 4 -Continue outpatient follow-up with oncology service (Dr. Shirline Frees) .Recurrent non-small cell lung cancer stage IV- s/p novolumab oncology  - s/p radiation  -- Patient is currently in surveillance by Dr. Shirline Frees and not currently on chemotherapy    HTN (hypertension) -controlled -Continue current antihypertensive agents       Consultants: none Procedures performed: none  Disposition: Home Diet recommendation:  Regular diet DISCHARGE MEDICATION: Allergies as of 10/08/2022       Reactions   Desyrel [trazodone]    "Made me faint"        Medication List     STOP taking these medications    guaiFENesin 600 MG 12 hr tablet Commonly known as: Mucinex       TAKE these medications    acetaminophen 325 MG tablet Commonly known as: TYLENOL Take 2 tablets (650 mg total) by  mouth every 6 (six) hours as needed for mild pain, fever or headache.   albuterol 108 (90 Base) MCG/ACT inhaler Commonly known as: VENTOLIN HFA Inhale 2 puffs into the lungs every 4 (four) hours as needed for shortness of breath.   albuterol (2.5 MG/3ML) 0.083% nebulizer  solution Commonly known as: PROVENTIL Take 3 mLs (2.5 mg total) by nebulization 3 (three) times daily as needed for shortness of breath or wheezing.   Combivent Respimat 20-100 MCG/ACT Aers respimat Generic drug: Ipratropium-Albuterol Inhale 1 puff into the lungs every 6 (six) hours as needed for wheezing or shortness of breath.   doxycycline 100 MG tablet Commonly known as: VIBRA-TABS Take 1 tablet (100 mg total) by mouth every 12 (twelve) hours.   DULoxetine 60 MG capsule Commonly known as: CYMBALTA Take 1 capsule (60 mg total) by mouth 2 (two) times daily.   Ensure Take 237 mLs by mouth 3 (three) times daily between meals.   fluticasone furoate-vilanterol 100-25 MCG/ACT Aepb Commonly known as: Breo Ellipta Inhale 1 puff into the lungs daily.   Integra Plus Caps Take 1 capsule by mouth every morning.   megestrol 625 MG/5ML suspension Commonly known as: Megace ES Take 5 mLs (625 mg total) by mouth daily. For appetite stimulation   metoprolol tartrate 25 MG tablet Commonly known as: LOPRESSOR Take 25 mg by mouth 2 (two) times daily.   multivitamin with minerals tablet Take 1 tablet by mouth daily.   oxyCODONE-acetaminophen 5-325 MG tablet Commonly known as: PERCOCET/ROXICET Take 1 tablet by mouth every 8 (eight) hours as needed for moderate pain.   pantoprazole 40 MG tablet Commonly known as: PROTONIX Take 1 tablet (40 mg total) by mouth 2 (two) times daily.   predniSONE 50 MG tablet Commonly known as: DELTASONE Take 1 tablet (50 mg total) by mouth daily with breakfast. Start taking on: October 09, 2022   prochlorperazine 10 MG tablet Commonly known as: COMPAZINE Take 1 tablet (10 mg total) by mouth every 6 (six) hours as needed for nausea or vomiting.   tamsulosin 0.4 MG Caps capsule Commonly known as: FLOMAX Take 1 capsule (0.4 mg total) by mouth daily after supper.   topiramate 25 MG tablet Commonly known as: Topamax Take 1 tablet (25 mg total) by mouth 2  (two) times daily.   traZODone 50 MG tablet Commonly known as: DESYREL Take 1 tablet (50 mg total) by mouth at bedtime.   Trelegy Ellipta 100-62.5-25 MCG/ACT Aepb Generic drug: Fluticasone-Umeclidin-Vilant Inhale 1 puff into the lungs daily.   Vitamin D (Ergocalciferol) 1.25 MG (50000 UNIT) Caps capsule Commonly known as: DRISDOL Take 1 capsule (50,000 Units total) by mouth once a week.        Discharge Exam: Filed Weights   10/06/22 1607 10/07/22 0431 10/08/22 0338  Weight: 54.5 kg 54 kg 57.9 kg   HEENT:  [place]/AT, No thrush, no icterus CV:  RRR, no rub, no S3, no S4 Lung:  diminished BS.  No wheeze Abd:  soft/+BS, NT Ext:  No edema, no lymphangitis, no synovitis, no rash   Condition at discharge: stable  The results of significant diagnostics from this hospitalization (including imaging, microbiology, ancillary and laboratory) are listed below for reference.   Imaging Studies: NM Pulmonary Perfusion  Result Date: 10/06/2022 CLINICAL DATA:  Shortness of breath for weeks. EXAM: NUCLEAR MEDICINE PERFUSION LUNG SCAN TECHNIQUE: Perfusion images were obtained in multiple projections after intravenous injection of radiopharmaceutical. Ventilation scans intentionally deferred if perfusion scan and chest x-ray adequate for interpretation  during COVID 19 epidemic. RADIOPHARMACEUTICALS:  4.4 mCi Tc-10m MAA IV COMPARISON:  X-ray 10/06/2022 and older. CT angiogram chest 08/25/2022 FINDINGS: Perfusion images only. Focal areas of decreased perfusion uptake identified at the right lung apex corresponding to an area of pleural thickening and effusion on the previous examinations. There is also some defect identified along the right hilar region corresponding to soft tissue thickening and lung opacity. Otherwise there is some slight heterogeneous distribution radiotracer is nonsegmental. Few additional small and peripheral subsegmental defects. IMPRESSION: Conglomeration of findings consistent with  low probability perfusion only lung scan. Electronically Signed   By: Karen Kays M.D.   On: 10/06/2022 18:46   US Venous Img Lower Bilateral (DVT)  Result Date: 10/06/2022 CLINICAL DATA:  Ankle and feet swelling EXAM: BILATERAL LOWER EXTREMITY VENOUS DOPPLER ULTRASOUND TECHNIQUE: Gray-scale sonography with compression, as well as color and duplex ultrasound, were performed to evaluate the deep venous system(s) from the level of the common femoral vein through the popliteal and proximal calf veins. COMPARISON:  None Available. FINDINGS: VENOUS Normal compressibility of the bilateral common femoral, superficial femoral, and popliteal veins, as well as the visualized calf veins. Visualized portions of profunda femoral vein and great saphenous vein unremarkable. No filling defects to suggest DVT on grayscale or color Doppler imaging. Doppler waveforms show normal direction of venous flow, normal respiratory plasticity and response to augmentation. OTHER None. Limitations: none IMPRESSION: Negative. Electronically Signed   By: Wiliam Ke M.D.   On: 10/06/2022 13:24   DG Chest 2 View  Result Date: 10/06/2022 CLINICAL DATA:  Shortness of breath EXAM: CHEST - 2 VIEW COMPARISON:  X-ray 08/28/2022 FINDINGS: Hyperinflation. There is some linear opacity at the bases likely scar or atelectasis. No pneumothorax. Interstitial changes identified with underlying chronic lung changes. There is focal masslike opacity at the right lung hilum, unchanged from previous. Separate spiculated nodule in the medial right lung apex. Tiny bilateral effusion. Normal cardiopericardial silhouette. Calcified aorta. Right IJ chest port. Overlapping cardiac leads IMPRESSION: Hyperinflation.  Chronic lung changes. Again noted made is a spiculated right upper lobe nodule and some bandlike opacity, fullness of the right lung hilum, perihilar region, unchanged from previous. Tiny pleural effusions. Please correlate with prior CT angiogram of  the chest from 08/25/2022. Chest port Electronically Signed   By: Karen Kays M.D.   On: 10/06/2022 12:11    Microbiology: Results for orders placed or performed during the hospital encounter of 10/06/22  Resp panel by RT-PCR (RSV, Flu A&B, Covid) Anterior Nasal Swab     Status: None   Collection Time: 10/06/22  2:00 PM   Specimen: Anterior Nasal Swab  Result Value Ref Range Status   SARS Coronavirus 2 by RT PCR NEGATIVE NEGATIVE Final    Comment: (NOTE) SARS-CoV-2 target nucleic acids are NOT DETECTED.  The SARS-CoV-2 RNA is generally detectable in upper respiratory specimens during the acute phase of infection. The lowest concentration of SARS-CoV-2 viral copies this assay can detect is 138 copies/mL. A negative result does not preclude SARS-Cov-2 infection and should not be used as the sole basis for treatment or other patient management decisions. A negative result may occur with  improper specimen collection/handling, submission of specimen other than nasopharyngeal swab, presence of viral mutation(s) within the areas targeted by this assay, and inadequate number of viral copies(<138 copies/mL). A negative result must be combined with clinical observations, patient history, and epidemiological information. The expected result is Negative.  Fact Sheet for Patients:  BloggerCourse.com  Fact  Sheet for Healthcare Providers:  SeriousBroker.it  This test is no t yet approved or cleared by the [place] FDA and  has been authorized for detection and/or diagnosis of SARS-CoV-2 by FDA under an Emergency Use Authorization (EUA). This EUA will remain  in effect (meaning this test can be used) for the duration of the COVID-19 declaration under Section 564(b)(1) of the Act, 21 U.S.C.section 360bbb-3(b)(1), unless the authorization is terminated  or revoked sooner.       Influenza A by PCR NEGATIVE NEGATIVE Final   Influenza B by  PCR NEGATIVE NEGATIVE Final    Comment: (NOTE) The Xpert Xpress SARS-CoV-2/FLU/RSV plus assay is intended as an aid in the diagnosis of influenza from Nasopharyngeal swab specimens and should not be used as a sole basis for treatment. Nasal washings and aspirates are unacceptable for Xpert Xpress SARS-CoV-2/FLU/RSV testing.  Fact Sheet for Patients: BloggerCourse.com  Fact Sheet for Healthcare Providers: SeriousBroker.it  This test is not yet approved or cleared by the [place] FDA and has been authorized for detection and/or diagnosis of SARS-CoV-2 by FDA under an Emergency Use Authorization (EUA). This EUA will remain in effect (meaning this test can be used) for the duration of the COVID-19 declaration under Section 564(b)(1) of the Act, 21 U.S.C. section 360bbb-3(b)(1), unless the authorization is terminated or revoked.     Resp Syncytial Virus by PCR NEGATIVE NEGATIVE Final    Comment: (NOTE) Fact Sheet for Patients: BloggerCourse.com  Fact Sheet for Healthcare Providers: SeriousBroker.it  This test is not yet approved or cleared by the [place] FDA and has been authorized for detection and/or diagnosis of SARS-CoV-2 by FDA under an Emergency Use Authorization (EUA). This EUA will remain in effect (meaning this test can be used) for the duration of the COVID-19 declaration under Section 564(b)(1) of the Act, 21 U.S.C. section 360bbb-3(b)(1), unless the authorization is terminated or revoked.  Performed at [facility], [address], [place], [place] [address]     Labs: CBC: Recent Labs  Lab 10/06/22 1141 10/07/22 0539  WBC 6.4 5.4  NEUTROABS 4.9  --   HGB 10.6* 9.9*  HCT 34.0* 31.8*  MCV 77.1* 75.9*  PLT 256 256   Basic Metabolic Panel: Recent Labs  Lab 10/06/22 1141 10/06/22 1324 10/07/22 0539 10/08/22 0512  NA 140  --  139 139  K 3.6  --   3.8 4.1  CL 103  --  103 99  CO2 27  --  30 32  GLUCOSE 123*  --  91 89  BUN 12  --  17 26*  CREATININE 0.62  --  0.59 0.59  CALCIUM 8.6*  --  8.8* 8.9  MG 1.8  --   --   --   PHOS  --  3.7  --   --    Liver Function Tests: No results for input(s): "AST", "ALT", "ALKPHOS", "BILITOT", "PROT", "ALBUMIN" in the last 168 hours. CBG: No results for input(s): "GLUCAP" in the last 168 hours.  Discharge time spent: greater than 30 minutes.  Signed: Catarina Hartshorn, MD [facility] 10/08/2022

## 2022-10-08 NOTE — Progress Notes (Signed)
Complaints of constipation, Miralax given last night. No other complaints or events overnight.

## 2022-10-13 ENCOUNTER — Ambulatory Visit: Payer: 59 | Attending: Internal Medicine | Admitting: Internal Medicine

## 2022-10-13 ENCOUNTER — Encounter: Payer: Self-pay | Admitting: Internal Medicine

## 2022-10-13 VITALS — BP 106/58 | HR 76 | Ht 67.0 in | Wt 115.8 lb

## 2022-10-13 DIAGNOSIS — R000 Tachycardia, unspecified: Secondary | ICD-10-CM | POA: Insufficient documentation

## 2022-10-13 DIAGNOSIS — I5030 Unspecified diastolic (congestive) heart failure: Secondary | ICD-10-CM | POA: Insufficient documentation

## 2022-10-13 DIAGNOSIS — I5032 Chronic diastolic (congestive) heart failure: Secondary | ICD-10-CM

## 2022-10-13 DIAGNOSIS — I350 Nonrheumatic aortic (valve) stenosis: Secondary | ICD-10-CM | POA: Insufficient documentation

## 2022-10-13 DIAGNOSIS — Z720 Tobacco use: Secondary | ICD-10-CM | POA: Insufficient documentation

## 2022-10-13 DIAGNOSIS — I351 Nonrheumatic aortic (valve) insufficiency: Secondary | ICD-10-CM | POA: Insufficient documentation

## 2022-10-13 MED ORDER — BISOPROLOL FUMARATE 5 MG PO TABS: 2.5000 mg | ORAL_TABLET | Freq: Every day | ORAL | 1 refills | Status: DC

## 2022-10-13 NOTE — Progress Notes (Signed)
Cardiology Office Note  Date: 10/13/2022   ID: Courtney Zuniga, DOB Feb 26, 1951, MRN [id number]  PCP:  Oneal Grout, FNP  Cardiologist:  Marjo Bicker, MD Electrophysiologist:  None   Reason for Office Visit: Hospital f/u   History of Present Illness: Courtney Zuniga is a 72 y.o. female known to have recurrent non-small cell lung cancer since 2017 currently in observation, COPD on 4L home oxygen, HTN, nicotine abuse, chronic diastolic heart failure is here for follow-up visit.  Patient was diagnosed with non-small cell lung cancer in 2017 on chemoradiation followed by progression of the cancer.  Follows with oncology and currently in observation.  Since cancer diagnosis in 2017, she has been having palpitations with exertional activities.  Event monitor showed resting HR more than 90 bpm throughout the day with average HR 107 bpm.  PVC burden was 1.2%.  She was started on metoprolol tartrate 25 mg twice daily by her PCP.  Echocardiogram in 1/24 showed normal LVEF, mild to moderate AI and mild AS.  Patient is here for follow-up visit, accompanied by niece.  She was recently admitted to the hospital with DOE and bilateral lower EXTR swelling in 6/24, was treated for COPD exacerbation and HFpEF exacerbation.  IV diuretics improved her leg swelling.  She was discharged on steroids and p.o. Lasix 20 mg as needed for SOB/LE swelling.  She is here for follow-up visit, continues to have palpitations with exertional activities but no palpitations at rest.  Has baseline SOB.  No leg swelling.  No orthopnea or PND.  No dizziness.  Past Medical History:  Diagnosis Date   Anemia    as a young woman   Arthritis    osteoartritis   Asthma    Brain tumor (benign) (HCC) 2005 [facility]   Benign   Chronic headaches    Chronic hip pain    Chronic pain    COPD (chronic obstructive pulmonary disease) (HCC)    Coronary artery disease    Depression    Depression 05/15/2016   Encounter for antineoplastic  chemotherapy 01/10/2016   GERD (gastroesophageal reflux disease)    Hypertension    Lung cancer (HCC) dx'd 01/2016   currently on chemo and radiation    NSTEMI (non-ST elevated myocardial infarction) (HCC) yrs ago   On home O2    qhs 2 liters at hs and prn   Pneumonia last time 2 yrs ago   Shortness of breath dyspnea    with activity    Past Surgical History:  Procedure Laterality Date   CHOLECYSTECTOMY     COLONOSCOPY  2015   Results requested from [facility]   COLONOSCOPY     ESOPHAGOGASTRODUODENOSCOPY N/A 08/14/2015   Procedure: ESOPHAGOGASTRODUODENOSCOPY (EGD);  Surgeon: Corbin Ade, MD;  Location: [facility];  Service: Endoscopy;  Laterality: N/A;  215    ESOPHAGOGASTRODUODENOSCOPY (EGD) WITH PROPOFOL N/A 09/13/2015   Procedure: ESOPHAGOGASTRODUODENOSCOPY (EGD) WITH PROPOFOL;  Surgeon: Rachael Fee, MD;  Location: [facility];  Service: Endoscopy;  Laterality: N/A;   ESOPHAGOGASTRODUODENOSCOPY (EGD) WITH PROPOFOL N/A 05/19/2022   Procedure: ESOPHAGOGASTRODUODENOSCOPY (EGD) WITH PROPOFOL;  Surgeon: Lanelle Bal, DO;  Location: [facility];  Service: Endoscopy;  Laterality: N/A;   EUS N/A 03/12/2017   Procedure: UPPER ENDOSCOPIC ULTRASOUND (EUS) RADIAL;  Surgeon: Rachael Fee, MD;  Location: [facility];  Service: Endoscopy;  Laterality: N/A;   HOT HEMOSTASIS  05/19/2022   Procedure: HOT HEMOSTASIS (ARGON PLASMA COAGULATION/BICAP);  Surgeon: Lanelle Bal, DO;  Location: [facility];  Service: Endoscopy;;   IR IMAGING GUIDED PORT INSERTION  01/20/2022   TUMOR REMOVAL  2005   Benign   UPPER ESOPHAGEAL ENDOSCOPIC ULTRASOUND (EUS)  09/13/2015   Procedure: UPPER ESOPHAGEAL ENDOSCOPIC ULTRASOUND (EUS);  Surgeon: Rachael Fee, MD;  Location: [facility];  Service: Endoscopy;;   VIDEO BRONCHOSCOPY WITH ENDOBRONCHIAL NAVIGATION N/A 12/31/2015   Procedure: VIDEO BRONCHOSCOPY WITH ENDOBRONCHIAL NAVIGATION;  Surgeon: Loreli Slot, MD;  Location: [facility]  OR;  Service: Thoracic;  Laterality: N/A;   VIDEO BRONCHOSCOPY WITH ENDOBRONCHIAL ULTRASOUND N/A 11/08/2015   Procedure: VIDEO BRONCHOSCOPY WITH ENDOBRONCHIAL ULTRASOUND;  Surgeon: Kerin Perna, MD;  Location: [facility] OR;  Service: Thoracic;  Laterality: N/A;    Current Outpatient Medications  Medication Sig Dispense Refill   acetaminophen (TYLENOL) 325 MG tablet Take 2 tablets (650 mg total) by mouth every 6 (six) hours as needed for mild pain, fever or headache. 12 tablet 2   albuterol (PROVENTIL) (2.5 MG/3ML) 0.083% nebulizer solution Take 3 mLs (2.5 mg total) by nebulization 3 (three) times daily as needed for shortness of breath or wheezing. 75 mL 12   albuterol (VENTOLIN HFA) 108 (90 Base) MCG/ACT inhaler Inhale 2 puffs into the lungs every 4 (four) hours as needed for shortness of breath. 18 g 3   doxycycline (VIBRA-TABS) 100 MG tablet Take 1 tablet (100 mg total) by mouth every 12 (twelve) hours. 6 tablet 0   DULoxetine (CYMBALTA) 60 MG capsule Take 1 capsule (60 mg total) by mouth 2 (two) times daily. 180 capsule 3   ENSURE (ENSURE) Take 237 mLs by mouth 3 (three) times daily between meals.     FeFum-FePoly-FA-B Cmp-C-Biot (INTEGRA PLUS) CAPS Take 1 capsule by mouth every morning. 30 capsule 5   fluticasone furoate-vilanterol (BREO ELLIPTA) 100-25 MCG/ACT AEPB Inhale 1 puff into the lungs daily. 28 each 5   furosemide (LASIX) 20 MG tablet Take 1 tablet (20 mg total) by mouth daily as needed (3 pound weight gain). 30 tablet 0   Ipratropium-Albuterol (COMBIVENT RESPIMAT) 20-100 MCG/ACT AERS respimat Inhale 1 puff into the lungs every 6 (six) hours as needed for wheezing or shortness of breath. 4 g 3   megestrol (MEGACE ES) 625 MG/5ML suspension Take 5 mLs (625 mg total) by mouth daily. For appetite stimulation 150 mL 3   metoprolol tartrate (LOPRESSOR) 25 MG tablet Take 25 mg by mouth 2 (two) times daily.     Multiple Vitamins-Minerals (MULTIVITAMIN WITH MINERALS) tablet Take 1 tablet by mouth  daily. 120 tablet 2   oxyCODONE-acetaminophen (PERCOCET/ROXICET) 5-325 MG tablet Take 1 tablet by mouth every 8 (eight) hours as needed for moderate pain. 12 tablet 0   pantoprazole (PROTONIX) 40 MG tablet Take 1 tablet (40 mg total) by mouth 2 (two) times daily. 60 tablet 11   predniSONE (DELTASONE) 50 MG tablet Take 1 tablet (50 mg total) by mouth daily with breakfast. 4 tablet 0   prochlorperazine (COMPAZINE) 10 MG tablet Take 1 tablet (10 mg total) by mouth every 6 (six) hours as needed for nausea or vomiting. 30 tablet 1   tamsulosin (FLOMAX) 0.4 MG CAPS capsule Take 1 capsule (0.4 mg total) by mouth daily after supper. 30 capsule 3   topiramate (TOPAMAX) 25 MG tablet Take 1 tablet (25 mg total) by mouth 2 (two) times daily. 60 tablet 2   traZODone (DESYREL) 50 MG tablet Take 1 tablet (50 mg total) by mouth at bedtime. 30 tablet 5   TRELEGY ELLIPTA 100-62.5-25  MCG/ACT AEPB Inhale 1 puff into the lungs daily. 60 each 3   Vitamin D, Ergocalciferol, (DRISDOL) 1.25 MG (50000 UNIT) CAPS capsule Take 1 capsule (50,000 Units total) by mouth once a week. 5 capsule 3   No current facility-administered medications for this visit.   Allergies:  Desyrel [trazodone]   Social History: The patient  reports that she quit smoking about 4 years ago. Her smoking use included cigarettes. She has a 38.00 pack-year smoking history. She has never used smokeless tobacco. She reports that she does not drink alcohol and does not use drugs.   Family History: The patient's family history includes Brain cancer in her sister; Lung cancer in her brother, brother, and father; Ovarian cancer in her mother; Prostate cancer in her brother.   ROS:  Please see the history of present illness. Otherwise, complete review of systems is positive for none.  All other systems are reviewed and negative.   Physical Exam: VS:  BP (!) 106/58   Pulse 76   Ht 5\' 7"  (1.702 m)   Wt 115 lb 12.8 oz (52.5 kg)   BMI 18.14 kg/m , BMI Body  mass index is 18.14 kg/m.  Wt Readings from Last 3 Encounters:  10/13/22 115 lb 12.8 oz (52.5 kg)  10/08/22 127 lb 10.3 oz (57.9 kg)  09/11/22 116 lb 4.8 oz (52.8 kg)    General: Patient appears comfortable at rest. HEENT: Conjunctiva and lids normal, oropharynx clear with moist mucosa. Neck: Supple, no elevated JVP or carotid bruits, no thyromegaly. Lungs: Clear to auscultation, nonlabored breathing at rest. Cardiac: Regular rate and rhythm, no S3 or significant systolic murmur, no pericardial rub. Abdomen: Soft, nontender, no hepatomegaly, bowel sounds present, no guarding or rebound. Extremities: No pitting edema, distal pulses 2+. Skin: Warm and dry. Musculoskeletal: No kyphosis. Neuropsychiatric: Alert and oriented x3, affect grossly appropriate.  ECG:  An ECG dated 04/09/22 was personally reviewed today and demonstrated:  Sinus tachycardia  Recent Labwork: 07/16/2022: ALT 10; AST 16 10/06/2022: B Natriuretic Peptide 179.0; Magnesium 1.8; TSH 1.897 10/07/2022: Hemoglobin 9.9; Platelets 256 10/08/2022: BUN 26; Creatinine, Ser 0.59; Potassium 4.1; Sodium 139  No results found for: "CHOL", "TRIG", "HDL", "CHOLHDL", "VLDL", "LDLCALC", "LDLDIRECT"  Other Studies Reviewed Today: Echo from 2017 LVEF normal No valve abnormalities  Assessment and Plan:  Patient is a 72 year old F known to have recurrent lung cancer, COPD on 4L home oxygen was referred to cardiology clinic for evaluation of PVCs.  # Sinus tachycardia likely secondary to deconditioning, protein calorie malnutrition, hypoxia etc. # PVC burden 1.2% -Event monitor from 1/24 showed resting HR more than 90 bpm and average HR 107 bpm.  PVC burden was 1.2%.  She was started on metoprolol tartrate 25 mg by PCP which I will switch to bisoprolol 2.5 mg twice daily due to COPD history.  # Chronic diastolic heart failure -Recent exacerbation in 6/24. Continue p.o. Lasix 20 mg as needed.  # Mild to moderate AR # Mild AS -Obtain  echocardiogram in 1 year  # Atypical chest pain history -Not a candidate for invasive ischemia evaluation.  Hence will defer noninvasive ischemia evaluation at this point.  # Nicotine abuse -Patient smokes 5 cigarettes/day and is trying very hard to quit.  Counseled and strongly encouraged to quit smoking. Smoking cessation instruction/counseling given:  counseled patient on the dangers of tobacco use, advised patient to stop smoking, and reviewed strategies to maximize success   I have spent a total of 30 minutes with patient reviewing  chart, EKGs, labs and examining patient as well as establishing an assessment and plan that was discussed with the patient.  > 50% of time was spent in direct patient care.     Medication Adjustments/Labs and Tests Ordered: Current medicines are reviewed at length with the patient today.  Concerns regarding medicines are outlined above.   Tests Ordered: Orders Placed This Encounter  Procedures   ECHOCARDIOGRAM COMPLETE      Medication Changes: Meds ordered this encounter  Medications   bisoprolol (ZEBETA) 5 MG tablet    Sig: Take 0.5 tablets (2.5 mg total) by mouth daily.    Dispense:  45 tablet    Refill:  1    10/13/22 metoprolol stopped     Disposition:  Follow up prn  Signed, Annasophia Crocker Verne Spurr, MD, 10/13/2022 1:05 PM    [facility] [address] [place], [place] [address]

## 2022-10-13 NOTE — Patient Instructions (Signed)
Medication Instructions:  STOP Metoprolol   START Bisoprolol 2.5 mg twice a day  Labwork: None today  Testing/Procedures: Your physician has requested that you have an echocardiogram IN 1 YEAR (2025) . Echocardiography is a painless test that uses sound waves to create images of your heart. It provides your doctor with information about the size and shape of your heart and how well your heart's chambers and valves are working. This procedure takes approximately one hour. There are no restrictions for this procedure. Please do NOT wear cologne, perfume, aftershave, or lotions (deodorant is allowed). Please arrive 15 minutes prior to your appointment time.   Follow-Up: 1 year  Any Other Special Instructions Will Be Listed Below (If Applicable).  If you need a refill on your cardiac medications before your next appointment, please call your pharmacy.

## 2022-10-22 ENCOUNTER — Ambulatory Visit: Payer: 59 | Admitting: Internal Medicine

## 2022-10-23 ENCOUNTER — Encounter: Payer: Self-pay | Admitting: Internal Medicine

## 2022-10-23 ENCOUNTER — Ambulatory Visit: Payer: 59 | Admitting: Internal Medicine

## 2022-11-03 ENCOUNTER — Telehealth: Payer: Self-pay | Admitting: Medical Oncology

## 2022-11-03 NOTE — Telephone Encounter (Signed)
Dtr given pt next appts. I told her radiology will call [facility] and give her the date and time for CT scan.  Unable to leave a VM on pts phone.

## 2022-12-05 ENCOUNTER — Other Ambulatory Visit: Payer: Self-pay

## 2022-12-08 ENCOUNTER — Inpatient Hospital Stay: Payer: 59 | Attending: Internal Medicine

## 2022-12-08 ENCOUNTER — Other Ambulatory Visit: Payer: Self-pay | Admitting: Internal Medicine

## 2022-12-08 ENCOUNTER — Ambulatory Visit (HOSPITAL_COMMUNITY)
Admission: RE | Admit: 2022-12-08 | Discharge: 2022-12-08 | Disposition: A | Discharge status: Home / Self Care | Payer: 59 | Source: Ambulatory Visit | Attending: Internal Medicine | Admitting: Internal Medicine

## 2022-12-08 DIAGNOSIS — C3490 Malignant neoplasm of unspecified part of unspecified bronchus or lung: Secondary | ICD-10-CM

## 2022-12-08 DIAGNOSIS — J449 Chronic obstructive pulmonary disease, unspecified: Secondary | ICD-10-CM | POA: Insufficient documentation

## 2022-12-08 DIAGNOSIS — Z85118 Personal history of other malignant neoplasm of bronchus and lung: Secondary | ICD-10-CM | POA: Insufficient documentation

## 2022-12-08 LAB — CBC WITH DIFFERENTIAL (CANCER CENTER ONLY)
Abs Immature Granulocytes: 0.02 10*3/uL (ref 0.00–0.07)
Basophils Absolute: 0 10*3/uL (ref 0.0–0.1)
Basophils Relative: 1 %
Eosinophils Absolute: 0.3 10*3/uL (ref 0.0–0.5)
Eosinophils Relative: 5 %
HCT: 36 % (ref 36.0–46.0)
Hemoglobin: 11.4 g/dL — ABNORMAL LOW (ref 12.0–15.0)
Immature Granulocytes: 0 %
Lymphocytes Relative: 22 %
Lymphs Abs: 1.2 10*3/uL (ref 0.7–4.0)
MCH: 26.3 pg (ref 26.0–34.0)
MCHC: 31.7 g/dL (ref 30.0–36.0)
MCV: 83.1 fL (ref 80.0–100.0)
Monocytes Absolute: 0.4 10*3/uL (ref 0.1–1.0)
Monocytes Relative: 7 %
Neutro Abs: 3.6 10*3/uL (ref 1.7–7.7)
Neutrophils Relative %: 65 %
Platelet Count: 248 10*3/uL (ref 150–400)
RBC: 4.33 MIL/uL (ref 3.87–5.11)
RDW: 20.6 % — ABNORMAL HIGH (ref 11.5–15.5)
WBC Count: 5.5 10*3/uL (ref 4.0–10.5)
nRBC: 0 % (ref 0.0–0.2)

## 2022-12-08 LAB — CMP (CANCER CENTER ONLY)
ALT: 9 U/L (ref 0–44)
AST: 18 U/L (ref 15–41)
Albumin: 4.1 g/dL (ref 3.5–5.0)
Alkaline Phosphatase: 256 U/L — ABNORMAL HIGH (ref 38–126)
Anion gap: 6 (ref 5–15)
BUN: 15 mg/dL (ref 8–23)
CO2: 34 mmol/L — ABNORMAL HIGH (ref 22–32)
Calcium: 9.3 mg/dL (ref 8.9–10.3)
Chloride: 108 mmol/L (ref 98–111)
Creatinine: 0.62 mg/dL (ref 0.44–1.00)
GFR, Estimated: >60 mL/min (ref >60)
Glucose, Bld: 86 mg/dL (ref 70–99)
Potassium: 4.7 mmol/L (ref 3.5–5.1)
Sodium: 148 mmol/L — ABNORMAL HIGH (ref 135–145)
Total Bilirubin: 0.2 mg/dL — ABNORMAL LOW (ref 0.3–1.2)
Total Protein: 6.9 g/dL (ref 6.5–8.1)

## 2022-12-08 LAB — FERRITIN: Ferritin: 9 ng/mL — ABNORMAL LOW (ref 11–307)

## 2022-12-08 LAB — IRON AND IRON BINDING CAPACITY (CC-WL,HP ONLY)
Iron: 26 ug/dL — ABNORMAL LOW (ref 28–170)
Saturation Ratios: 7 % — ABNORMAL LOW (ref 10.4–31.8)
TIBC: 356 ug/dL (ref 250–450)
UIBC: 330 ug/dL (ref 148–442)

## 2022-12-08 MED ORDER — IOHEXOL 9 MG/ML PO SOLN
ORAL | Status: AC
  Filled 2022-12-08: qty 1000

## 2022-12-08 MED ORDER — IOHEXOL 9 MG/ML PO SOLN
1000.0000 mL | ORAL | Status: AC
  Administered 2022-12-08: 1000 mL via ORAL

## 2022-12-11 ENCOUNTER — Inpatient Hospital Stay: Payer: 59 | Admitting: Internal Medicine

## 2022-12-11 ENCOUNTER — Inpatient Hospital Stay (HOSPITAL_BASED_OUTPATIENT_CLINIC_OR_DEPARTMENT_OTHER): Payer: 59 | Admitting: Internal Medicine

## 2022-12-11 VITALS — BP 109/67 | HR 110 | Temp 98.2°F | Resp 16 | Ht 67.0 in | Wt 110.0 lb

## 2022-12-11 DIAGNOSIS — C3490 Malignant neoplasm of unspecified part of unspecified bronchus or lung: Secondary | ICD-10-CM | POA: Diagnosis not present

## 2022-12-11 DIAGNOSIS — J449 Chronic obstructive pulmonary disease, unspecified: Secondary | ICD-10-CM | POA: Diagnosis not present

## 2022-12-11 DIAGNOSIS — Z85118 Personal history of other malignant neoplasm of bronchus and lung: Secondary | ICD-10-CM | POA: Diagnosis present

## 2022-12-11 NOTE — Progress Notes (Signed)
[facility] [phone]   [phone]  OFFICE PROGRESS NOTE  Oneal Grout, FNP [address] [place] [address]  DIAGNOSIS: Recurrent non-small cell lung cancer initially diagnosed as stage IIIA (T1b, N2, M0) non-small cell lung cancer presented with right middle lobe pulmonary nodule, mediastinal lymphadenopathy and highly suspicious for small nodule in the left upper lobe that could change her stage to stage IV that could present another synchronous primary lesion in the left upper lobe. This was diagnosed in September 2017.  PRIOR THERAPY:  1) Concurrent chemoradiation with weekly carboplatin for AUC of 2 and paclitaxel 45 MG/M2 status post 6 cycles last dose was given 02/25/2016 with partial response. 2) Consolidation chemotherapy with carboplatin for AUC of 5 and paclitaxel 175 MG/M2 every 3 weeks with Neulasta support. First dose 04/22/2016. Status post 3 cycles. 3) Second line treatment with immunotherapy with Nivolumab 480 mg IV every 4 weeks status post 32 cycles.  Discontinued secondary to intolerance and frequent hospitalization with pneumonia and pneumonitis.  She had a break of treatment between November 24, 2019 until June 28, 2020. 4) Resuming her treatment with immunotherapy with nivolumab 480 mg IV every 4 weeks.  Cycle #33 started on June 28, 2020.  Status post 55 cycles.  CURRENT THERAPY: Observation  INTERVAL HISTORY: Courtney Zuniga 72 y.o. female returns to the clinic today for follow-up visit.  The patient is feeling fine with no concerning complaints except for the baseline shortness of breath and she is currently on home oxygen 4 L/minute.  She denied having any chest pain, cough or hemoptysis.  She has no nausea, vomiting, diarrhea or constipation.  She has no headache or visual changes.  She is here today for evaluation with repeat blood work as well as CT scan of the chest, abdomen and pelvis for restaging of her  disease.   MEDICAL HISTORY: Past Medical History:  Diagnosis Date   Anemia    as a young woman   Arthritis    osteoartritis   Asthma    Brain tumor (benign) (HCC) 2005 [facility]   Benign   Chronic headaches    Chronic hip pain    Chronic pain    COPD (chronic obstructive pulmonary disease) (HCC)    Coronary artery disease    Depression    Depression 05/15/2016   Encounter for antineoplastic chemotherapy 01/10/2016   GERD (gastroesophageal reflux disease)    Hypertension    Lung cancer (HCC) dx'd 01/2016   currently on chemo and radiation    NSTEMI (non-ST elevated myocardial infarction) (HCC) yrs ago   On home O2    qhs 2 liters at hs and prn   Pneumonia last time 2 yrs ago   Shortness of breath dyspnea    with activity    ALLERGIES:  is allergic to desyrel [trazodone].  MEDICATIONS:  Current Outpatient Medications  Medication Sig Dispense Refill   acetaminophen (TYLENOL) 325 MG tablet Take 2 tablets (650 mg total) by mouth every 6 (six) hours as needed for mild pain, fever or headache. 12 tablet 2   albuterol (PROVENTIL) (2.5 MG/3ML) 0.083% nebulizer solution Take 3 mLs (2.5 mg total) by nebulization 3 (three) times daily as needed for shortness of breath or wheezing. 75 mL 12   albuterol (VENTOLIN HFA) 108 (90 Base) MCG/ACT inhaler Inhale 2 puffs into the lungs every 4 (four) hours as needed for shortness of breath. 18 g 3   bisoprolol (ZEBETA) 5 MG tablet  Take 0.5 tablets (2.5 mg total) by mouth daily. 45 tablet 1   doxycycline (VIBRA-TABS) 100 MG tablet Take 1 tablet (100 mg total) by mouth every 12 (twelve) hours. 6 tablet 0   DULoxetine (CYMBALTA) 60 MG capsule Take 1 capsule (60 mg total) by mouth 2 (two) times daily. 180 capsule 3   ENSURE (ENSURE) Take 237 mLs by mouth 3 (three) times daily between meals.     FeFum-FePoly-FA-B Cmp-C-Biot (INTEGRA PLUS) CAPS Take 1 capsule by mouth every morning. 30 capsule 5   fluticasone furoate-vilanterol (BREO ELLIPTA) 100-25  MCG/ACT AEPB Inhale 1 puff into the lungs daily. 28 each 5   furosemide (LASIX) 20 MG tablet Take 1 tablet (20 mg total) by mouth daily as needed (3 pound weight gain). 30 tablet 0   Ipratropium-Albuterol (COMBIVENT RESPIMAT) 20-100 MCG/ACT AERS respimat Inhale 1 puff into the lungs every 6 (six) hours as needed for wheezing or shortness of breath. 4 g 3   megestrol (MEGACE ES) 625 MG/5ML suspension Take 5 mLs (625 mg total) by mouth daily. For appetite stimulation 150 mL 3   Multiple Vitamins-Minerals (MULTIVITAMIN WITH MINERALS) tablet Take 1 tablet by mouth daily. 120 tablet 2   oxyCODONE-acetaminophen (PERCOCET/ROXICET) 5-325 MG tablet Take 1 tablet by mouth every 8 (eight) hours as needed for moderate pain. 12 tablet 0   pantoprazole (PROTONIX) 40 MG tablet Take 1 tablet (40 mg total) by mouth 2 (two) times daily. 60 tablet 11   predniSONE (DELTASONE) 50 MG tablet Take 1 tablet (50 mg total) by mouth daily with breakfast. 4 tablet 0   prochlorperazine (COMPAZINE) 10 MG tablet Take 1 tablet (10 mg total) by mouth every 6 (six) hours as needed for nausea or vomiting. 30 tablet 1   tamsulosin (FLOMAX) 0.4 MG CAPS capsule Take 1 capsule (0.4 mg total) by mouth daily after supper. 30 capsule 3   topiramate (TOPAMAX) 25 MG tablet Take 1 tablet (25 mg total) by mouth 2 (two) times daily. 60 tablet 2   traZODone (DESYREL) 50 MG tablet Take 1 tablet (50 mg total) by mouth at bedtime. 30 tablet 5   TRELEGY ELLIPTA 100-62.5-25 MCG/ACT AEPB Inhale 1 puff into the lungs daily. 60 each 3   Vitamin D, Ergocalciferol, (DRISDOL) 1.25 MG (50000 UNIT) CAPS capsule Take 1 capsule (50,000 Units total) by mouth once a week. 5 capsule 3   No current facility-administered medications for this visit.    SURGICAL HISTORY:  Past Surgical History:  Procedure Laterality Date   CHOLECYSTECTOMY     COLONOSCOPY  2015   Results requested from [facility]   COLONOSCOPY     ESOPHAGOGASTRODUODENOSCOPY N/A  08/14/2015   Procedure: ESOPHAGOGASTRODUODENOSCOPY (EGD);  Surgeon: Corbin Ade, MD;  Location: [facility];  Service: Endoscopy;  Laterality: N/A;  215    ESOPHAGOGASTRODUODENOSCOPY (EGD) WITH PROPOFOL N/A 09/13/2015   Procedure: ESOPHAGOGASTRODUODENOSCOPY (EGD) WITH PROPOFOL;  Surgeon: Rachael Fee, MD;  Location: [facility];  Service: Endoscopy;  Laterality: N/A;   ESOPHAGOGASTRODUODENOSCOPY (EGD) WITH PROPOFOL N/A 05/19/2022   Procedure: ESOPHAGOGASTRODUODENOSCOPY (EGD) WITH PROPOFOL;  Surgeon: Lanelle Bal, DO;  Location: [facility];  Service: Endoscopy;  Laterality: N/A;   EUS N/A 03/12/2017   Procedure: UPPER ENDOSCOPIC ULTRASOUND (EUS) RADIAL;  Surgeon: Rachael Fee, MD;  Location: [facility];  Service: Endoscopy;  Laterality: N/A;   HOT HEMOSTASIS  05/19/2022   Procedure: HOT HEMOSTASIS (ARGON PLASMA COAGULATION/BICAP);  Surgeon: Lanelle Bal, DO;  Location: [facility];  Service: Endoscopy;;   IR IMAGING GUIDED PORT INSERTION  01/20/2022   TUMOR REMOVAL  2005   Benign   UPPER ESOPHAGEAL ENDOSCOPIC ULTRASOUND (EUS)  09/13/2015   Procedure: UPPER ESOPHAGEAL ENDOSCOPIC ULTRASOUND (EUS);  Surgeon: Rachael Fee, MD;  Location: [facility];  Service: Endoscopy;;   VIDEO BRONCHOSCOPY WITH ENDOBRONCHIAL NAVIGATION N/A 12/31/2015   Procedure: VIDEO BRONCHOSCOPY WITH ENDOBRONCHIAL NAVIGATION;  Surgeon: Loreli Slot, MD;  Location: [facility] OR;  Service: Thoracic;  Laterality: N/A;   VIDEO BRONCHOSCOPY WITH ENDOBRONCHIAL ULTRASOUND N/A 11/08/2015   Procedure: VIDEO BRONCHOSCOPY WITH ENDOBRONCHIAL ULTRASOUND;  Surgeon: Kerin Perna, MD;  Location: [facility] OR;  Service: Thoracic;  Laterality: N/A;    REVIEW OF SYSTEMS:  A comprehensive review of systems was negative except for: Respiratory: positive for dyspnea on exertion   PHYSICAL EXAMINATION: General appearance: alert, cooperative, fatigued, and no distress Head: Normocephalic, without obvious abnormality,  atraumatic Neck: no adenopathy, no JVD, supple, symmetrical, trachea midline, and thyroid not enlarged, symmetric, no tenderness/mass/nodules Lymph nodes: Cervical, supraclavicular, and axillary nodes normal. Resp: clear to auscultation bilaterally Back: symmetric, no curvature. ROM normal. No CVA tenderness. Cardio: regular rate and rhythm, S1, S2 normal, no murmur, click, rub or gallop GI: soft, non-tender; bowel sounds normal; no masses,  no organomegaly Extremities: extremities normal, atraumatic, no cyanosis or edema  ECOG PERFORMANCE STATUS: 1 - Symptomatic but completely ambulatory  Blood pressure 109/67, pulse (!) 110, temperature 98.2 F (36.8 C), resp. rate 16, height 5\' 7"  (1.702 m), weight 110 lb (49.9 kg), SpO2 98%.  LABORATORY DATA: Lab Results  Component Value Date   WBC 5.5 12/08/2022   HGB 11.4 (L) 12/08/2022   HCT 36.0 12/08/2022   MCV 83.1 12/08/2022   PLT 248 12/08/2022      Chemistry      Component Value Date/Time   NA 148 (H) 12/08/2022 0834   NA 144 04/09/2017 0948   K 4.7 12/08/2022 0834   K 4.3 04/09/2017 0948   CL 108 12/08/2022 0834   CO2 34 (H) 12/08/2022 0834   CO2 25 04/09/2017 0948   BUN 15 12/08/2022 0834   BUN 16.8 04/09/2017 0948   CREATININE 0.62 12/08/2022 0834   CREATININE 0.8 04/09/2017 0948      Component Value Date/Time   CALCIUM 9.3 12/08/2022 0834   CALCIUM 9.1 04/09/2017 0948   ALKPHOS 256 (H) 12/08/2022 0834   ALKPHOS 216 (H) 04/09/2017 0948   AST 18 12/08/2022 0834   AST 12 04/09/2017 0948   ALT 9 12/08/2022 0834   ALT 13 04/09/2017 0948   BILITOT 0.2 (L) 12/08/2022 0834   BILITOT 0.22 04/09/2017 0948       RADIOGRAPHIC STUDIES:-   CT CHEST ABDOMEN PELVIS WO CONTRAST  Result Date: 12/11/2022 CLINICAL DATA:  Non-small cell lung cancer EXAM: CT CHEST, ABDOMEN AND PELVIS WITHOUT CONTRAST TECHNIQUE: Multidetector CT imaging of the chest, abdomen and pelvis was performed following the standard protocol without IV  contrast. RADIATION DOSE REDUCTION: This exam was performed according to the departmental dose-optimization program which includes automated exposure control, adjustment of the mA and/or kV according to patient size and/or use of iterative reconstruction technique. COMPARISON:  CTA chest dated 08/25/2022. CT chest abdomen pelvis dated 06/25/2022. FINDINGS: CT CHEST FINDINGS Cardiovascular: The heart is normal in size. No pericardial effusion. No evidence thoracic aortic aneurysm. Atherosclerotic calcifications of the aortic arch. Moderate three-vessel coronary atherosclerosis. Right chest port terminates in the lower SVC. Mediastinum/Nodes: No suspicious mediastinal lymphadenopathy. Visualized thyroid is unremarkable.  Lungs/Pleura: 19 x 13 mm irregular right upper lobe nodule (series 4/image 29), corresponding to the patient's known primary bronchogenic carcinoma, grossly unchanged. Surrounding radiation changes, including in the inferior right upper lobe. Right middle lobe atelectasis/collapse with air bronchograms. Associated debris in the right middle lobe bronchus (series 4/image 93). Additional scarring/nodular opacity in the medial right lower lobe, measuring approximately 1.8 x 1.1 cm (series 4/102), grossly unchanged. This appearance favors platelike scarring when correlating with the coronal images. Moderate centrilobular and paraseptal emphysematous changes. No new/suspicious pulmonary nodules. Mild linear/platelike scarring/atelectasis in the bilateral lower lobes. No focal consolidation. No pleural effusion or pneumothorax. Musculoskeletal: Thoracic spine is within normal limits. CT ABDOMEN PELVIS FINDINGS Hepatobiliary: Unenhanced liver is unremarkable. Status post cholecystectomy. No intrahepatic duct dilatation. Mildly prominent common duct, measuring 12 mm, although smoothly tapering at the ampulla, likely postsurgical. Pancreas: Within normal limits. Spleen: Within normal limits Adrenals/Urinary  Tract: Adrenal glands are within normal limits. Scattered bilateral renal cysts, measuring up to 14 mm in the right lower pole (series 2/image 31), measuring simple fluid density, benign (Bosniak I). No follow-up is recommended. No renal, ureteral, or bladder calculi.  No hydronephrosis. Bladder is within normal limits. Stomach/Bowel: Stomach is within normal limits. No evidence of bowel obstruction. Normal appendix (series 2/image 95). Right colon is mildly thick-walled (series 2/image 82), although favored to reflect underdistention. Extensive left colonic diverticulosis, without evidence of diverticulitis. Vascular/Lymphatic: No evidence of abdominal aortic aneurysm. Atherosclerotic calcifications of the abdominal aorta and branch vessels. No suspicious abdominopelvic lymphadenopathy. Reproductive: Calcified uterine fibroids. Bilateral ovaries are within normal limits. Other: No abdominopelvic ascites. Musculoskeletal: Visualized osseous structures are within normal limits. IMPRESSION: Stable right upper lobe nodule, corresponding to the patient's known primary bronchogenic neoplasm, status post radiation. No findings suspicious for metastatic disease. Right middle lobe atelectasis/collapse with air bronchograms. Associated bilateral lower lobe scarring. Additional ancillary findings as above. Aortic Atherosclerosis (ICD10-I70.0) and Emphysema (ICD10-J43.9). Electronically Signed   By: Charline Bills M.D.   On: 12/11/2022 09:39     ASSESSMENT AND PLAN:  This is a very pleasant 72 years old African-American female with a recurrent non-small cell lung cancer initially diagnosed as stage IIIA non-small cell lung cancer status post concurrent chemoradiation followed by consolidation chemotherapy with carboplatin and paclitaxel for 3 cycles. The patient was followed by observation but restaging imaging studies showed evidence for disease progression. She was started on second line treatment with immunotherapy  with Nivolumab 480 mg IV every 4 weeks status post 32 cycles.   The patient has been in observation since August 2021 with no concerning complaints except for the baseline shortness of breath and intermittent headache. The patient resumed her treatment with cycle number cycle #33 of single agent nivolumab 480 mg IV every 4 weeks in March 2022 status post 55 cycles.  Her treatment is currently on hold send The patient has been on treatment for long time with no clear evidence for disease progression. She is currently on observation and she is feeling fine. The patient had repeat CT scan of the chest, abdomen and pelvis performed recently.  I personally and independently reviewed the scan and discussed the result with the patient today. Her scan showed no concerning findings for disease recurrence or metastasis. I recommended for her to continue on observation with repeat CT scan of the chest, abdomen and pelvis in 4 months. For the history of COPD, she will continue her current home oxygen and nebulizer as prescribed by her pulmonologist and primary care physician.  The patient was advised to call immediately if she has any concerning symptoms in the interval.  All questions were answered. The patient knows to call the clinic with any problems, questions or concerns. We can certainly see the patient much sooner if necessary.  Disclaimer: This note was dictated with voice recognition software. Similar sounding words can inadvertently be transcribed and may not be corrected upon review.

## 2022-12-12 ENCOUNTER — Other Ambulatory Visit: Payer: Self-pay

## 2023-02-23 ENCOUNTER — Other Ambulatory Visit: Payer: Self-pay

## 2023-02-23 ENCOUNTER — Emergency Department (HOSPITAL_COMMUNITY): Payer: 59

## 2023-02-23 ENCOUNTER — Emergency Department (HOSPITAL_COMMUNITY)
Admission: EM | Admit: 2023-02-23 | Discharge: 2023-02-23 | Disposition: A | Discharge status: Home / Self Care | Payer: 59 | Attending: Emergency Medicine | Admitting: Emergency Medicine

## 2023-02-23 ENCOUNTER — Encounter (HOSPITAL_COMMUNITY): Payer: Self-pay

## 2023-02-23 DIAGNOSIS — Z7982 Long term (current) use of aspirin: Secondary | ICD-10-CM | POA: Insufficient documentation

## 2023-02-23 DIAGNOSIS — Z79899 Other long term (current) drug therapy: Secondary | ICD-10-CM | POA: Diagnosis not present

## 2023-02-23 DIAGNOSIS — J441 Chronic obstructive pulmonary disease with (acute) exacerbation: Secondary | ICD-10-CM | POA: Diagnosis not present

## 2023-02-23 DIAGNOSIS — R0602 Shortness of breath: Secondary | ICD-10-CM | POA: Diagnosis present

## 2023-02-23 DIAGNOSIS — Z7951 Long term (current) use of inhaled steroids: Secondary | ICD-10-CM | POA: Diagnosis not present

## 2023-02-23 LAB — CBC
HCT: 37.3 % (ref 36.0–46.0)
Hemoglobin: 12 g/dL (ref 12.0–15.0)
MCH: 27.7 pg (ref 26.0–34.0)
MCHC: 32.2 g/dL (ref 30.0–36.0)
MCV: 86.1 fL (ref 80.0–100.0)
Platelets: 261 10*3/uL (ref 150–400)
RBC: 4.33 MIL/uL (ref 3.87–5.11)
RDW: 18.2 % — ABNORMAL HIGH (ref 11.5–15.5)
WBC: 5.6 10*3/uL (ref 4.0–10.5)
nRBC: 0 % (ref 0.0–0.2)

## 2023-02-23 LAB — BASIC METABOLIC PANEL
Anion gap: 7 (ref 5–15)
BUN: 17 mg/dL (ref 8–23)
CO2: 36 mmol/L — ABNORMAL HIGH (ref 22–32)
Calcium: 8.8 mg/dL — ABNORMAL LOW (ref 8.9–10.3)
Chloride: 98 mmol/L (ref 98–111)
Creatinine, Ser: 0.56 mg/dL (ref 0.44–1.00)
GFR, Estimated: >60 mL/min (ref >60)
Glucose, Bld: 99 mg/dL (ref 70–99)
Potassium: 3.9 mmol/L (ref 3.5–5.1)
Sodium: 141 mmol/L (ref 135–145)

## 2023-02-23 MED ORDER — ALBUTEROL SULFATE HFA 108 (90 BASE) MCG/ACT IN AERS: 2.0000 | INHALATION_SPRAY | RESPIRATORY_TRACT | Status: DC | PRN

## 2023-02-23 MED ORDER — ALBUTEROL SULFATE (2.5 MG/3ML) 0.083% IN NEBU
2.5000 mg | INHALATION_SOLUTION | Freq: Once | RESPIRATORY_TRACT | Status: AC
  Administered 2023-02-23: 2.5 mg via RESPIRATORY_TRACT
  Filled 2023-02-23: qty 3

## 2023-02-23 MED ORDER — DOXYCYCLINE HYCLATE 100 MG PO CAPS: 100.0000 mg | ORAL_CAPSULE | Freq: Two times a day (BID) | ORAL | 0 refills | Status: DC

## 2023-02-23 MED ORDER — IPRATROPIUM-ALBUTEROL 0.5-2.5 (3) MG/3ML IN SOLN
3.0000 mL | Freq: Once | RESPIRATORY_TRACT | Status: AC
  Administered 2023-02-23: 3 mL via RESPIRATORY_TRACT
  Filled 2023-02-23: qty 3

## 2023-02-23 MED ORDER — PREDNISONE 10 MG PO TABS: 20.0000 mg | ORAL_TABLET | Freq: Every day | ORAL | 0 refills | Status: DC

## 2023-02-23 MED ORDER — MAGNESIUM SULFATE 2 GM/50ML IV SOLN
2.0000 g | Freq: Once | INTRAVENOUS | Status: AC
  Administered 2023-02-23: 2 g via INTRAVENOUS
  Filled 2023-02-23: qty 50

## 2023-02-23 MED ORDER — METHYLPREDNISOLONE SODIUM SUCC 125 MG IJ SOLR
125.0000 mg | Freq: Once | INTRAMUSCULAR | Status: AC
  Administered 2023-02-23: 125 mg via INTRAVENOUS
  Filled 2023-02-23: qty 2

## 2023-02-23 NOTE — ED Notes (Signed)
Portable xray at bedside.

## 2023-02-23 NOTE — ED Triage Notes (Signed)
Pt to er, states that she is here for shortness of breath, states that she has been sob all day, pt has accessory muscle use, and pursed lip breathing, states that she is on 4L O2 at home.

## 2023-02-23 NOTE — Discharge Instructions (Signed)
Follow-up with your doctor next week for recheck.  Get seen sooner if problems

## 2023-02-24 NOTE — ED Provider Notes (Signed)
[facility] AT [facility] Provider Note   CSN: [id number] Arrival date & time: 02/23/23  1846     History  Chief Complaint  Patient presents with   Shortness of Breath    Courtney Zuniga is a 72 y.o. female.  Patient complains of shortness of breath.  Patient has a history of COPD  The history is provided by the patient and medical records. No language interpreter was used.  Shortness of Breath Severity:  Moderate Onset quality:  Sudden Timing:  Constant Progression:  Worsening Chronicity:  Recurrent Context: activity   Relieved by:  Nothing Worsened by:  Nothing Associated symptoms: no abdominal pain, no chest pain, no cough, no headaches and no rash        Home Medications Prior to Admission medications   Medication Sig Start Date End Date Taking? Authorizing Provider  acetaminophen (TYLENOL) 325 MG tablet Take 2 tablets (650 mg total) by mouth every 6 (six) hours as needed for mild pain, fever or headache. 05/08/18  Yes Emokpae, Courage, MD  albuterol (PROVENTIL) (2.5 MG/3ML) 0.083% nebulizer solution Take 3 mLs (2.5 mg total) by nebulization 3 (three) times daily as needed for shortness of breath or wheezing. 08/28/22  Yes Emokpae, Courage, MD  albuterol (VENTOLIN HFA) 108 (90 Base) MCG/ACT inhaler Inhale 2 puffs into the lungs every 4 (four) hours as needed for shortness of breath. 08/28/22  Yes Emokpae, Courage, MD  Aspirin-Acetaminophen-Caffeine (GOODYS EXTRA STRENGTH PO) Take 1 packet by mouth daily as needed (headaches).   Yes [provider]  bisoprolol (ZEBETA) 5 MG tablet Take 0.5 tablets (2.5 mg total) by mouth daily. Patient taking differently: Take 2.5 mg by mouth daily as needed (high bp). 10/13/22  Yes Mallipeddi, Vishnu P, MD  BREO ELLIPTA 200-25 MCG/ACT AEPB Inhale 1 puff into the lungs daily. 01/21/23  Yes [provider]  Docusate Sodium (DSS) 100 MG CAPS Take 1 capsule by mouth daily. 10/14/22  Yes [provider]  doxycycline (VIBRAMYCIN) 100 MG capsule Take 1 capsule (100 mg total) by mouth 2 (two) times daily. One po bid x 7 days 02/23/23  Yes Bethann Berkshire, MD  DULoxetine (CYMBALTA) 60 MG capsule Take 1 capsule (60 mg total) by mouth 2 (two) times daily. 08/28/22  Yes Emokpae, Courage, MD  ENSURE (ENSURE) Take 237 mLs by mouth 3 (three) times daily between meals.   Yes [provider]  FeFum-FePoly-FA-B Cmp-C-Biot (INTEGRA PLUS) CAPS Take 1 capsule by mouth every morning. 08/28/22  Yes Emokpae, Courage, MD  furosemide (LASIX) 20 MG tablet Take 1 tablet (20 mg total) by mouth daily as needed (3 pound weight gain). 10/08/22  Yes Tat, Onalee Hua, MD  Ipratropium-Albuterol (COMBIVENT RESPIMAT) 20-100 MCG/ACT AERS respimat Inhale 1 puff into the lungs every 6 (six) hours as needed for wheezing or shortness of breath. 08/28/22  Yes Emokpae, Courage, MD  Multiple Vitamins-Minerals (MULTIVITAMIN WITH MINERALS) tablet Take 1 tablet by mouth daily. 08/28/22 08/28/23 Yes Emokpae, Courage, MD  oxyCODONE-acetaminophen (PERCOCET/ROXICET) 5-325 MG tablet Take 1 tablet by mouth every 8 (eight) hours as needed for moderate pain. 05/08/18  Yes Emokpae, Courage, MD  pantoprazole (PROTONIX) 40 MG tablet Take 1 tablet (40 mg total) by mouth 2 (two) times daily. 08/28/22 08/28/23 Yes Emokpae, Courage, MD  predniSONE (DELTASONE) 10 MG tablet Take 2 tablets (20 mg total) by mouth daily. 02/23/23  Yes Bethann Berkshire, MD  tamsulosin (FLOMAX) 0.4 MG CAPS capsule Take 1 capsule (0.4 mg total) by mouth daily after  supper. 08/28/22  Yes Emokpae, Courage, MD  topiramate (TOPAMAX) 25 MG tablet Take 1 tablet (25 mg total) by mouth 2 (two) times daily. 08/28/22  Yes Emokpae, Courage, MD  traZODone (DESYREL) 50 MG tablet Take 1 tablet (50 mg total) by mouth at bedtime. 08/28/22  Yes Emokpae, Courage, MD  TRELEGY ELLIPTA 100-62.5-25 MCG/ACT AEPB Inhale 1 puff into the lungs daily. 08/28/22  Yes Emokpae, Courage, MD  Vitamin D,  Ergocalciferol, (DRISDOL) 1.25 MG (50000 UNIT) CAPS capsule Take 1 capsule (50,000 Units total) by mouth once a week. 08/28/22  Yes Emokpae, Courage, MD  HYDROcodone bit-homatropine (HYCODAN) 5-1.5 MG/5ML syrup Take 5 mLs by mouth every 6 (six) hours as needed for cough. 11/10/21   Si Gaul, MD      Allergies    Desyrel [trazodone]    Review of Systems   Review of Systems  Constitutional:  Negative for appetite change and fatigue.  HENT:  Negative for congestion, ear discharge and sinus pressure.   Eyes:  Negative for discharge.  Respiratory:  Positive for shortness of breath. Negative for cough.   Cardiovascular:  Negative for chest pain.  Gastrointestinal:  Negative for abdominal pain and diarrhea.  Genitourinary:  Negative for frequency and hematuria.  Musculoskeletal:  Negative for back pain.  Skin:  Negative for rash.  Neurological:  Negative for seizures and headaches.  Psychiatric/Behavioral:  Negative for hallucinations.     Physical Exam Updated Vital Signs BP 128/78 (BP Location: Left Arm)   Pulse 100   Temp 98.6 F (37 C) (Oral)   Resp (!) 25   Ht 5\' 6"  (1.676 m)   Wt 56.7 kg   SpO2 97%   BMI 20.18 kg/m  Physical Exam Vitals and nursing note reviewed.  Constitutional:      Appearance: She is well-developed.  HENT:     Head: Normocephalic.  Eyes:     General: No scleral icterus.    Conjunctiva/sclera: Conjunctivae normal.  Neck:     Thyroid: No thyromegaly.  Cardiovascular:     Rate and Rhythm: Normal rate and regular rhythm.     Heart sounds: No murmur heard.    No friction rub. No gallop.  Pulmonary:     Breath sounds: No stridor. Wheezing present. No rales.  Chest:     Chest wall: No tenderness.  Abdominal:     General: There is no distension.     Tenderness: There is no abdominal tenderness. There is no rebound.  Musculoskeletal:        General: Normal range of motion.     Cervical back: Neck supple.  Lymphadenopathy:     Cervical: No  cervical adenopathy.  Skin:    Findings: No erythema or rash.  Neurological:     Mental Status: She is alert and oriented to person, place, and time.     Motor: No abnormal muscle tone.     Coordination: Coordination normal.  Psychiatric:        Behavior: Behavior normal.     ED Results / Procedures / Treatments   Labs (all labs ordered are listed, but only abnormal results are displayed) Labs Reviewed  BASIC METABOLIC PANEL - Abnormal; Notable for the following components:      Result Value   CO2 36 (*)    Calcium 8.8 (*)    All other components within normal limits  CBC - Abnormal; Notable for the following components:   RDW 18.2 (*)    All other components within normal limits  EKG None  Radiology DG Chest Port 1 View  Result Date: 02/23/2023 CLINICAL DATA:  COPD EXAM: PORTABLE CHEST 1 VIEW COMPARISON:  10/06/2022 pain CT chest 12/08/2022 FINDINGS: Chronic bronchitic change and hyperinflation. Atelectasis/Scarring about the right hilum and right lower lobe. No new focal consolidation, pleural effusion, or pneumothorax. The spiculated nodule in the right upper lobe is not well visualized. Right chest wall Port-A-Cath tip in the mid SVC. Stable cardiomediastinal silhouette. Aortic atherosclerotic calcification. IMPRESSION: No acute process.  Emphysema. Electronically Signed   By: Minerva Fester M.D.   On: 02/23/2023 23:25    Procedures Procedures    Medications Ordered in ED Medications  methylPREDNISolone sodium succinate (SOLU-MEDROL) 125 mg/2 mL injection 125 mg (125 mg Intravenous Given 02/23/23 2015)  magnesium sulfate IVPB 2 g 50 mL (0 g Intravenous Stopped 02/23/23 2107)  ipratropium-albuterol (DUONEB) 0.5-2.5 (3) MG/3ML nebulizer solution 3 mL (3 mLs Nebulization Given 02/23/23 2029)  albuterol (PROVENTIL) (2.5 MG/3ML) 0.083% nebulizer solution 2.5 mg (2.5 mg Nebulization Given 02/23/23 2028)    ED Course/ Medical Decision Making/ A&P                                  Medical Decision Making Amount and/or Complexity of Data Reviewed Labs: ordered. Radiology: ordered.  Risk Prescription drug management.   Patient with COPD exacerbation.  She improved with neb treatment and steroids.  She will be sent home with steroids and doxycycline and follow-up with her PCP       Final Clinical Impression(s) / ED Diagnoses Final diagnoses:  COPD exacerbation (HCC)    Rx / DC Orders ED Discharge Orders          Ordered    predniSONE (DELTASONE) 10 MG tablet  Daily        02/23/23 2255    doxycycline (VIBRAMYCIN) 100 MG capsule  2 times daily        02/23/23 2255              Bethann Berkshire, MD 02/24/23 1301

## 2023-03-25 ENCOUNTER — Telehealth: Payer: Self-pay | Admitting: Internal Medicine

## 2023-03-25 NOTE — Telephone Encounter (Signed)
Patient is aware of scheduled appointment times/dates for scheduled appointments

## 2023-03-26 ENCOUNTER — Other Ambulatory Visit: Payer: Self-pay

## 2023-03-30 ENCOUNTER — Inpatient Hospital Stay: Payer: 59 | Attending: Internal Medicine

## 2023-03-30 DIAGNOSIS — C3411 Malignant neoplasm of upper lobe, right bronchus or lung: Secondary | ICD-10-CM | POA: Insufficient documentation

## 2023-03-30 DIAGNOSIS — J449 Chronic obstructive pulmonary disease, unspecified: Secondary | ICD-10-CM | POA: Insufficient documentation

## 2023-03-30 DIAGNOSIS — Z9981 Dependence on supplemental oxygen: Secondary | ICD-10-CM | POA: Insufficient documentation

## 2023-03-30 DIAGNOSIS — Z79899 Other long term (current) drug therapy: Secondary | ICD-10-CM | POA: Insufficient documentation

## 2023-03-30 DIAGNOSIS — R519 Headache, unspecified: Secondary | ICD-10-CM | POA: Insufficient documentation

## 2023-03-31 ENCOUNTER — Ambulatory Visit (HOSPITAL_COMMUNITY)
Admission: RE | Admit: 2023-03-31 | Discharge: 2023-03-31 | Disposition: A | Discharge status: Home / Self Care | Payer: 59 | Source: Ambulatory Visit | Attending: Internal Medicine | Admitting: Internal Medicine

## 2023-03-31 DIAGNOSIS — C3490 Malignant neoplasm of unspecified part of unspecified bronchus or lung: Secondary | ICD-10-CM | POA: Diagnosis present

## 2023-04-07 ENCOUNTER — Ambulatory Visit: Payer: 59 | Admitting: Internal Medicine

## 2023-04-13 ENCOUNTER — Inpatient Hospital Stay (HOSPITAL_BASED_OUTPATIENT_CLINIC_OR_DEPARTMENT_OTHER): Payer: 59 | Admitting: Internal Medicine

## 2023-04-13 VITALS — BP 123/72 | HR 101 | Temp 99.0°F | Resp 16 | Ht 66.0 in | Wt 124.5 lb

## 2023-04-13 DIAGNOSIS — R519 Headache, unspecified: Secondary | ICD-10-CM | POA: Diagnosis not present

## 2023-04-13 DIAGNOSIS — C3490 Malignant neoplasm of unspecified part of unspecified bronchus or lung: Secondary | ICD-10-CM | POA: Diagnosis not present

## 2023-04-13 DIAGNOSIS — Z9981 Dependence on supplemental oxygen: Secondary | ICD-10-CM | POA: Diagnosis not present

## 2023-04-13 DIAGNOSIS — Z79899 Other long term (current) drug therapy: Secondary | ICD-10-CM | POA: Diagnosis not present

## 2023-04-13 DIAGNOSIS — J449 Chronic obstructive pulmonary disease, unspecified: Secondary | ICD-10-CM | POA: Diagnosis not present

## 2023-04-13 DIAGNOSIS — C3411 Malignant neoplasm of upper lobe, right bronchus or lung: Secondary | ICD-10-CM | POA: Diagnosis present

## 2023-04-13 NOTE — Progress Notes (Signed)
[facility] [phone]   [phone]  OFFICE PROGRESS NOTE  Oneal Grout, FNP [address] [place] [address]  DIAGNOSIS: Recurrent non-small cell lung cancer initially diagnosed as stage IIIA (T1b, N2, M0) non-small cell lung cancer favoring adenocarcinoma presented with right middle lobe pulmonary nodule, mediastinal lymphadenopathy and highly suspicious for small nodule in the left upper lobe that could change her stage to stage IV that could present another synchronous primary lesion in the left upper lobe. This was diagnosed in September 2017.  PRIOR THERAPY:  1) Concurrent chemoradiation with weekly carboplatin for AUC of 2 and paclitaxel 45 MG/M2 status post 6 cycles last dose was given 02/25/2016 with partial response. 2) Consolidation chemotherapy with carboplatin for AUC of 5 and paclitaxel 175 MG/M2 every 3 weeks with Neulasta support. First dose 04/22/2016. Status post 3 cycles. 3) Second line treatment with immunotherapy with Nivolumab 480 mg IV every 4 weeks status post 32 cycles.  Discontinued secondary to intolerance and frequent hospitalization with pneumonia and pneumonitis.  She had a break of treatment between November 24, 2019 until June 28, 2020. 4) Resuming her treatment with immunotherapy with nivolumab 480 mg IV every 4 weeks.  Cycle #33 started on June 28, 2020.  Status post 55 cycles.  CURRENT THERAPY: Observation  INTERVAL HISTORY: Salem Fedie 72 y.o. female returns to the clinic today for 49-month follow-up visit.Discussed the use of AI scribe software for clinical note transcription with the patient, who gave verbal consent to proceed.  History of Present Illness   The patient, aged 72, has a history of non-small cell lung carcinoma (NSCLC) diagnosed in September 2017. She underwent several rounds of chemotherapy and subsequently received nivolumab for several years. Recently, the patient was given a break from  nivolumab. She reports a persistent headache, despite multiple brain scans revealing no abnormalities. The patient also experiences shortness of breath and requires 4 liters of oxygen continuously, attributed to her concurrent COPD. No other complaints were reported, specifically denying nausea, vomiting, and diarrhea. The patient has noticed a weight increase from 110 lbs in August to 124 lbs at the time of the conversation. The patient's cancer has remained stable on recent CT scans of the chest, abdomen, and pelvis.       MEDICAL HISTORY: Past Medical History:  Diagnosis Date   Anemia    as a young woman   Arthritis    osteoartritis   Asthma    Brain tumor (benign) (HCC) 2005 [facility]   Benign   Chronic headaches    Chronic hip pain    Chronic pain    COPD (chronic obstructive pulmonary disease) (HCC)    Coronary artery disease    Depression    Depression 05/15/2016   Encounter for antineoplastic chemotherapy 01/10/2016   GERD (gastroesophageal reflux disease)    Hypertension    Lung cancer (HCC) dx'd 01/2016   currently on chemo and radiation    NSTEMI (non-ST elevated myocardial infarction) (HCC) yrs ago   On home O2    qhs 2 liters at hs and prn   Pneumonia last time 2 yrs ago   Shortness of breath dyspnea    with activity    ALLERGIES:  is allergic to desyrel [trazodone].  MEDICATIONS:  Current Outpatient Medications  Medication Sig Dispense Refill   acetaminophen (TYLENOL) 325 MG tablet Take 2 tablets (650 mg total) by mouth every 6 (six) hours as needed for mild pain, fever or headache.  12 tablet 2   albuterol (PROVENTIL) (2.5 MG/3ML) 0.083% nebulizer solution Take 3 mLs (2.5 mg total) by nebulization 3 (three) times daily as needed for shortness of breath or wheezing. 75 mL 12   albuterol (VENTOLIN HFA) 108 (90 Base) MCG/ACT inhaler Inhale 2 puffs into the lungs every 4 (four) hours as needed for shortness of breath. 18 g 3   Aspirin-Acetaminophen-Caffeine (GOODYS  EXTRA STRENGTH PO) Take 1 packet by mouth daily as needed (headaches).     bisoprolol (ZEBETA) 5 MG tablet Take 0.5 tablets (2.5 mg total) by mouth daily. (Patient taking differently: Take 2.5 mg by mouth daily as needed (high bp).) 45 tablet 1   BREO ELLIPTA 200-25 MCG/ACT AEPB Inhale 1 puff into the lungs daily.     Docusate Sodium (DSS) 100 MG CAPS Take 1 capsule by mouth daily.     doxycycline (VIBRAMYCIN) 100 MG capsule Take 1 capsule (100 mg total) by mouth 2 (two) times daily. One po bid x 7 days 14 capsule 0   DULoxetine (CYMBALTA) 60 MG capsule Take 1 capsule (60 mg total) by mouth 2 (two) times daily. 180 capsule 3   ENSURE (ENSURE) Take 237 mLs by mouth 3 (three) times daily between meals.     FeFum-FePoly-FA-B Cmp-C-Biot (INTEGRA PLUS) CAPS Take 1 capsule by mouth every morning. 30 capsule 5   furosemide (LASIX) 20 MG tablet Take 1 tablet (20 mg total) by mouth daily as needed (3 pound weight gain). 30 tablet 0   Ipratropium-Albuterol (COMBIVENT RESPIMAT) 20-100 MCG/ACT AERS respimat Inhale 1 puff into the lungs every 6 (six) hours as needed for wheezing or shortness of breath. 4 g 3   Multiple Vitamins-Minerals (MULTIVITAMIN WITH MINERALS) tablet Take 1 tablet by mouth daily. 120 tablet 2   oxyCODONE-acetaminophen (PERCOCET/ROXICET) 5-325 MG tablet Take 1 tablet by mouth every 8 (eight) hours as needed for moderate pain. 12 tablet 0   pantoprazole (PROTONIX) 40 MG tablet Take 1 tablet (40 mg total) by mouth 2 (two) times daily. 60 tablet 11   predniSONE (DELTASONE) 10 MG tablet Take 2 tablets (20 mg total) by mouth daily. 14 tablet 0   tamsulosin (FLOMAX) 0.4 MG CAPS capsule Take 1 capsule (0.4 mg total) by mouth daily after supper. 30 capsule 3   topiramate (TOPAMAX) 25 MG tablet Take 1 tablet (25 mg total) by mouth 2 (two) times daily. 60 tablet 2   traZODone (DESYREL) 50 MG tablet Take 1 tablet (50 mg total) by mouth at bedtime. 30 tablet 5   TRELEGY ELLIPTA 100-62.5-25 MCG/ACT AEPB  Inhale 1 puff into the lungs daily. 60 each 3   Vitamin D, Ergocalciferol, (DRISDOL) 1.25 MG (50000 UNIT) CAPS capsule Take 1 capsule (50,000 Units total) by mouth once a week. 5 capsule 3   No current facility-administered medications for this visit.    SURGICAL HISTORY:  Past Surgical History:  Procedure Laterality Date   CHOLECYSTECTOMY     COLONOSCOPY  2015   Results requested from [facility]   COLONOSCOPY     ESOPHAGOGASTRODUODENOSCOPY N/A 08/14/2015   Procedure: ESOPHAGOGASTRODUODENOSCOPY (EGD);  Surgeon: Corbin Ade, MD;  Location: [facility];  Service: Endoscopy;  Laterality: N/A;  215    ESOPHAGOGASTRODUODENOSCOPY (EGD) WITH PROPOFOL N/A 09/13/2015   Procedure: ESOPHAGOGASTRODUODENOSCOPY (EGD) WITH PROPOFOL;  Surgeon: Rachael Fee, MD;  Location: [facility];  Service: Endoscopy;  Laterality: N/A;   ESOPHAGOGASTRODUODENOSCOPY (EGD) WITH PROPOFOL N/A 05/19/2022   Procedure: ESOPHAGOGASTRODUODENOSCOPY (EGD) WITH PROPOFOL;  Surgeon: Marletta Lor,  Hennie Duos, DO;  Location: [facility];  Service: Endoscopy;  Laterality: N/A;   EUS N/A 03/12/2017   Procedure: UPPER ENDOSCOPIC ULTRASOUND (EUS) RADIAL;  Surgeon: Rachael Fee, MD;  Location: [facility];  Service: Endoscopy;  Laterality: N/A;   HOT HEMOSTASIS  05/19/2022   Procedure: HOT HEMOSTASIS (ARGON PLASMA COAGULATION/BICAP);  Surgeon: Lanelle Bal, DO;  Location: [facility];  Service: Endoscopy;;   IR IMAGING GUIDED PORT INSERTION  01/20/2022   TUMOR REMOVAL  2005   Benign   UPPER ESOPHAGEAL ENDOSCOPIC ULTRASOUND (EUS)  09/13/2015   Procedure: UPPER ESOPHAGEAL ENDOSCOPIC ULTRASOUND (EUS);  Surgeon: Rachael Fee, MD;  Location: [facility];  Service: Endoscopy;;   VIDEO BRONCHOSCOPY WITH ENDOBRONCHIAL NAVIGATION N/A 12/31/2015   Procedure: VIDEO BRONCHOSCOPY WITH ENDOBRONCHIAL NAVIGATION;  Surgeon: Loreli Slot, MD;  Location: [facility] OR;  Service: Thoracic;  Laterality: N/A;   VIDEO BRONCHOSCOPY WITH  ENDOBRONCHIAL ULTRASOUND N/A 11/08/2015   Procedure: VIDEO BRONCHOSCOPY WITH ENDOBRONCHIAL ULTRASOUND;  Surgeon: Kerin Perna, MD;  Location: [facility] OR;  Service: Thoracic;  Laterality: N/A;    REVIEW OF SYSTEMS:  Constitutional: positive for fatigue Eyes: negative Ears, nose, mouth, throat, and face: negative Respiratory: positive for dyspnea on exertion Cardiovascular: negative Gastrointestinal: negative Genitourinary:negative Integument/breast: negative Hematologic/lymphatic: negative Musculoskeletal:negative Neurological: positive for headaches Behavioral/Psych: negative Endocrine: negative Allergic/Immunologic: negative   PHYSICAL EXAMINATION: General appearance: alert, cooperative, fatigued, and no distress Head: Normocephalic, without obvious abnormality, atraumatic Neck: no adenopathy, no JVD, supple, symmetrical, trachea midline, and thyroid not enlarged, symmetric, no tenderness/mass/nodules Lymph nodes: Cervical, supraclavicular, and axillary nodes normal. Resp: clear to auscultation bilaterally Back: symmetric, no curvature. ROM normal. No CVA tenderness. Cardio: regular rate and rhythm, S1, S2 normal, no murmur, click, rub or gallop GI: soft, non-tender; bowel sounds normal; no masses,  no organomegaly Extremities: extremities normal, atraumatic, no cyanosis or edema Neurologic: Alert and oriented X 3, normal strength and tone. Normal symmetric reflexes. Normal coordination and gait  ECOG PERFORMANCE STATUS: 1 - Symptomatic but completely ambulatory  Blood pressure 123/72, pulse (!) 101, temperature 99 F (37.2 C), temperature source Temporal, resp. rate 16, height 5\' 6"  (1.676 m), weight 124 lb 8 oz (56.5 kg), SpO2 94%.  LABORATORY DATA: Lab Results  Component Value Date   WBC 5.6 02/23/2023   HGB 12.0 02/23/2023   HCT 37.3 02/23/2023   MCV 86.1 02/23/2023   PLT 261 02/23/2023      Chemistry      Component Value Date/Time   NA 141 02/23/2023 1933   NA 144  04/09/2017 0948   K 3.9 02/23/2023 1933   K 4.3 04/09/2017 0948   CL 98 02/23/2023 1933   CO2 36 (H) 02/23/2023 1933   CO2 25 04/09/2017 0948   BUN 17 02/23/2023 1933   BUN 16.8 04/09/2017 0948   CREATININE 0.56 02/23/2023 1933   CREATININE 0.62 12/08/2022 0834   CREATININE 0.8 04/09/2017 0948      Component Value Date/Time   CALCIUM 8.8 (L) 02/23/2023 1933   CALCIUM 9.1 04/09/2017 0948   ALKPHOS 256 (H) 12/08/2022 0834   ALKPHOS 216 (H) 04/09/2017 0948   AST 18 12/08/2022 0834   AST 12 04/09/2017 0948   ALT 9 12/08/2022 0834   ALT 13 04/09/2017 0948   BILITOT 0.2 (L) 12/08/2022 0834   BILITOT 0.22 04/09/2017 0948       RADIOGRAPHIC STUDIES:-   CT CHEST ABDOMEN PELVIS WO CONTRAST Result Date: 04/07/2023 CLINICAL DATA:  Non-small cell lung cancer  EXAM: CT CHEST, ABDOMEN AND PELVIS WITHOUT CONTRAST TECHNIQUE: Multidetector CT imaging of the chest, abdomen and pelvis was performed following the standard protocol without IV contrast. RADIATION DOSE REDUCTION: This exam was performed according to the departmental dose-optimization program which includes automated exposure control, adjustment of the mA and/or kV according to patient size and/or use of iterative reconstruction technique. COMPARISON:  12/08/2022 FINDINGS: CT CHEST FINDINGS Cardiovascular: The heart is normal in size. No pericardial effusion. No evidence of thoracic aortic aneurysm. Atherosclerotic calcifications of the aortic arch. Moderate three-vessel coronary atherosclerosis. Right chest port terminates at the cavoatrial junction. Mediastinum/Nodes: No suspicious mediastinal lymphadenopathy. Visualized thyroid is unremarkable. Lungs/Pleura: 2.0 x 1.2 cm irregular nodular opacity in the right upper lobe, unchanged. Radiation changes in the medial right hemithorax/perihilar region (series 3/image 100). Associated platelike scarring in the medial right lower lobe (series 3/image 104). No new/suspicious pulmonary nodules.  Moderate centrilobular and paraseptal emphysematous changes, upper lung predominant. No focal consolidation. Mild scarring/atelectasis in the bilateral lower lobes. No pleural effusion or pneumothorax. Musculoskeletal: Visualized osseous structures are within normal limits. CT ABDOMEN PELVIS FINDINGS Hepatobiliary: Unenhanced liver is unremarkable. Status post cholecystectomy. No intrahepatic or extrahepatic dilatation. Pancreas: Within normal limits. Spleen: Within normal limits Adrenals/Urinary Tract: Adrenal glands are within normal limits. 10 mm posterior left lower pole renal cyst (series 2/image 68), benign (Bosniak I). 13 mm right lower pole renal cyst (series 2/image 71), benign (Bosniak I). No follow-up is recommended. No renal calculi or hydronephrosis. Bladder is within normal limits. Stomach/Bowel: Stomach is within normal limits. No evidence of bowel obstruction. Normal appendix (series 2/image 109). No colonic wall thickening or inflammatory changes. Vascular/Lymphatic: No evidence of abdominal aortic aneurysm. Atherosclerotic calcifications of the abdominal aorta and branch vessels. No suspicious abdominopelvic lymphadenopathy. Reproductive: Calcified uterine fibroids. No adnexal masses. Other: No abdominopelvic ascites. Musculoskeletal: Visualized osseous structures are within normal limits. IMPRESSION: 2.0 cm irregular nodular opacity in the right upper lobe, corresponding to the patient's known treated primary bronchogenic carcinoma, unchanged. Radiation changes in the medial right hemithorax/perihilar region. No findings suspicious for recurrent or metastatic disease. Aortic Atherosclerosis (ICD10-I70.0) and Emphysema (ICD10-J43.9). Electronically Signed   By: Charline Bills M.D.   On: 04/07/2023 01:16     ASSESSMENT AND PLAN:  This is a very pleasant 72 years old African-American female with a recurrent non-small cell lung cancer initially diagnosed as stage IIIA non-small cell lung  cancer status post concurrent chemoradiation followed by consolidation chemotherapy with carboplatin and paclitaxel for 3 cycles. The patient was followed by observation but restaging imaging studies showed evidence for disease progression. She was started on second line treatment with immunotherapy with Nivolumab 480 mg IV every 4 weeks status post 32 cycles.   The patient has been in observation since August 2021 with no concerning complaints except for the baseline shortness of breath and intermittent headache. The patient resumed her treatment with cycle number cycle #33 of single agent nivolumab 480 mg IV every 4 weeks in March 2022 status post 55 cycles.  Her treatment is currently on hold send The patient has been on treatment for long time with no clear evidence for disease progression. She is currently on observation and she is feeling fine. The patient had repeat CT scan of the chest, abdomen and pelvis performed recently.  I personally and independently reviewed the scan and discussed the result with the patient today.  Her scan showed no concerning findings for disease progression. Assessment and Plan    Non-Small Cell Lung Cancer (  NSCLC) NSCLC (adenocarcinoma) diagnosed in September 2017, treated with chemotherapy and nivolumab. Recent CT scans of the chest, abdomen, and pelvis show no changes. Currently off nivolumab. Patient concerned about recurrence, reassured about stable disease. - Continue monitoring with CT scans every four months - Schedule next follow-up appointment in four months  Chronic Obstructive Pulmonary Disease (COPD) COPD contributing to dyspnea. On continuous oxygen therapy at 4 L/min. No new symptoms reported. - Continue oxygen therapy at 4 L/min - Monitor respiratory status  Headache Persistent headaches with no abnormalities on previous brain scans. No new symptoms reported. - Monitor headache symptoms - Consider further evaluation if symptoms persist or  worsen  Follow-up - Investigate the purpose of the January 16 appointment and inform the patient.   The patient was advised to call immediately if she has any other concerning symptoms in the interval.  All questions were answered. The patient knows to call the clinic with any problems, questions or concerns. We can certainly see the patient much sooner if necessary. The total time spent in the appointment was 30 minutes.  Disclaimer: This note was dictated with voice recognition software. Similar sounding words can inadvertently be transcribed and may not be corrected upon review.

## 2023-04-17 ENCOUNTER — Telehealth: Payer: Self-pay | Admitting: Internal Medicine

## 2023-04-17 NOTE — Telephone Encounter (Signed)
 Patient is aware of scheduled appointment times/dates

## 2023-04-19 ENCOUNTER — Other Ambulatory Visit: Payer: Self-pay

## 2023-04-30 ENCOUNTER — Inpatient Hospital Stay: Payer: 59 | Attending: Internal Medicine

## 2023-04-30 ENCOUNTER — Telehealth: Payer: Self-pay | Admitting: Medical Oncology

## 2023-04-30 ENCOUNTER — Encounter: Payer: Self-pay | Admitting: Internal Medicine

## 2023-04-30 VITALS — BP 115/70 | HR 101 | Temp 98.3°F | Resp 20

## 2023-04-30 DIAGNOSIS — Z452 Encounter for adjustment and management of vascular access device: Secondary | ICD-10-CM | POA: Diagnosis present

## 2023-04-30 DIAGNOSIS — C3411 Malignant neoplasm of upper lobe, right bronchus or lung: Secondary | ICD-10-CM | POA: Diagnosis present

## 2023-04-30 DIAGNOSIS — K5520 Angiodysplasia of colon without hemorrhage: Secondary | ICD-10-CM

## 2023-04-30 DIAGNOSIS — Z87891 Personal history of nicotine dependence: Secondary | ICD-10-CM | POA: Insufficient documentation

## 2023-04-30 DIAGNOSIS — D500 Iron deficiency anemia secondary to blood loss (chronic): Secondary | ICD-10-CM

## 2023-04-30 MED ORDER — SODIUM CHLORIDE 0.9% FLUSH
10.0000 mL | Freq: Once | INTRAVENOUS | Status: AC
  Administered 2023-04-30: 10 mL

## 2023-04-30 MED ORDER — HEPARIN SOD (PORK) LOCK FLUSH 100 UNIT/ML IV SOLN
500.0000 [IU] | Freq: Once | INTRAVENOUS | Status: AC
  Administered 2023-04-30: 500 [IU]

## 2023-04-30 NOTE — Telephone Encounter (Signed)
"  C/O increased SOB with 4L via N/C, also C/O non productive cough" . Pt is currently under observation and she was instructed to go to PCP for her symptoms.

## 2023-04-30 NOTE — Progress Notes (Signed)
Pt. Here for port flush.  C/O increased SOB while on 4L via N/C.  Also, C/O non product cough!  Wants Lidociane cream called to [facility].  Secure Chatted Vincent Peyer.  States for pt. To follow up with PCP.  Pt. informed

## 2023-05-28 ENCOUNTER — Inpatient Hospital Stay (HOSPITAL_COMMUNITY)
Admission: EM | Admit: 2023-05-28 | Discharge: 2023-06-03 | DRG: 193 | Disposition: A | Discharge status: Home / Self Care | Payer: 59 | Attending: Internal Medicine | Admitting: Internal Medicine

## 2023-05-28 ENCOUNTER — Other Ambulatory Visit: Payer: Self-pay

## 2023-05-28 ENCOUNTER — Encounter (HOSPITAL_COMMUNITY): Payer: Self-pay

## 2023-05-28 ENCOUNTER — Emergency Department (HOSPITAL_COMMUNITY): Payer: 59

## 2023-05-28 DIAGNOSIS — Z681 Body mass index (BMI) 19 or less, adult: Secondary | ICD-10-CM | POA: Diagnosis not present

## 2023-05-28 DIAGNOSIS — Z6820 Body mass index (BMI) 20.0-20.9, adult: Secondary | ICD-10-CM

## 2023-05-28 DIAGNOSIS — Z9221 Personal history of antineoplastic chemotherapy: Secondary | ICD-10-CM | POA: Diagnosis not present

## 2023-05-28 DIAGNOSIS — J9622 Acute and chronic respiratory failure with hypercapnia: Secondary | ICD-10-CM | POA: Diagnosis present

## 2023-05-28 DIAGNOSIS — J9621 Acute and chronic respiratory failure with hypoxia: Principal | ICD-10-CM

## 2023-05-28 DIAGNOSIS — Z7982 Long term (current) use of aspirin: Secondary | ICD-10-CM

## 2023-05-28 DIAGNOSIS — K259 Gastric ulcer, unspecified as acute or chronic, without hemorrhage or perforation: Secondary | ICD-10-CM | POA: Diagnosis present

## 2023-05-28 DIAGNOSIS — Z87891 Personal history of nicotine dependence: Secondary | ICD-10-CM | POA: Diagnosis not present

## 2023-05-28 DIAGNOSIS — Z8711 Personal history of peptic ulcer disease: Secondary | ICD-10-CM | POA: Diagnosis not present

## 2023-05-28 DIAGNOSIS — J90 Pleural effusion, not elsewhere classified: Secondary | ICD-10-CM

## 2023-05-28 DIAGNOSIS — Z515 Encounter for palliative care: Secondary | ICD-10-CM | POA: Diagnosis not present

## 2023-05-28 DIAGNOSIS — I10 Essential (primary) hypertension: Secondary | ICD-10-CM | POA: Diagnosis not present

## 2023-05-28 DIAGNOSIS — Z8719 Personal history of other diseases of the digestive system: Secondary | ICD-10-CM | POA: Diagnosis not present

## 2023-05-28 DIAGNOSIS — K219 Gastro-esophageal reflux disease without esophagitis: Secondary | ICD-10-CM | POA: Diagnosis present

## 2023-05-28 DIAGNOSIS — D649 Anemia, unspecified: Secondary | ICD-10-CM | POA: Diagnosis present

## 2023-05-28 DIAGNOSIS — I5030 Unspecified diastolic (congestive) heart failure: Secondary | ICD-10-CM | POA: Diagnosis present

## 2023-05-28 DIAGNOSIS — Z8042 Family history of malignant neoplasm of prostate: Secondary | ICD-10-CM

## 2023-05-28 DIAGNOSIS — Z9981 Dependence on supplemental oxygen: Secondary | ICD-10-CM | POA: Diagnosis not present

## 2023-05-28 DIAGNOSIS — Z720 Tobacco use: Secondary | ICD-10-CM | POA: Diagnosis not present

## 2023-05-28 DIAGNOSIS — J441 Chronic obstructive pulmonary disease with (acute) exacerbation: Secondary | ICD-10-CM | POA: Diagnosis present

## 2023-05-28 DIAGNOSIS — Z7189 Other specified counseling: Secondary | ICD-10-CM | POA: Diagnosis not present

## 2023-05-28 DIAGNOSIS — Z86011 Personal history of benign neoplasm of the brain: Secondary | ICD-10-CM

## 2023-05-28 DIAGNOSIS — J181 Lobar pneumonia, unspecified organism: Secondary | ICD-10-CM | POA: Diagnosis present

## 2023-05-28 DIAGNOSIS — R000 Tachycardia, unspecified: Secondary | ICD-10-CM | POA: Diagnosis present

## 2023-05-28 DIAGNOSIS — R29810 Facial weakness: Secondary | ICD-10-CM | POA: Diagnosis present

## 2023-05-28 DIAGNOSIS — Z1152 Encounter for screening for COVID-19: Secondary | ICD-10-CM | POA: Diagnosis not present

## 2023-05-28 DIAGNOSIS — J189 Pneumonia, unspecified organism: Secondary | ICD-10-CM | POA: Diagnosis not present

## 2023-05-28 DIAGNOSIS — E43 Unspecified severe protein-calorie malnutrition: Secondary | ICD-10-CM | POA: Diagnosis present

## 2023-05-28 DIAGNOSIS — Z801 Family history of malignant neoplasm of trachea, bronchus and lung: Secondary | ICD-10-CM

## 2023-05-28 DIAGNOSIS — I110 Hypertensive heart disease with heart failure: Secondary | ICD-10-CM | POA: Diagnosis present

## 2023-05-28 DIAGNOSIS — I2510 Atherosclerotic heart disease of native coronary artery without angina pectoris: Secondary | ICD-10-CM | POA: Diagnosis present

## 2023-05-28 DIAGNOSIS — K5520 Angiodysplasia of colon without hemorrhage: Secondary | ICD-10-CM | POA: Diagnosis present

## 2023-05-28 DIAGNOSIS — Z888 Allergy status to other drugs, medicaments and biological substances status: Secondary | ICD-10-CM

## 2023-05-28 DIAGNOSIS — R54 Age-related physical debility: Secondary | ICD-10-CM | POA: Diagnosis present

## 2023-05-28 DIAGNOSIS — J439 Emphysema, unspecified: Secondary | ICD-10-CM | POA: Diagnosis present

## 2023-05-28 DIAGNOSIS — I5032 Chronic diastolic (congestive) heart failure: Secondary | ICD-10-CM | POA: Diagnosis present

## 2023-05-28 DIAGNOSIS — I252 Old myocardial infarction: Secondary | ICD-10-CM

## 2023-05-28 DIAGNOSIS — C3491 Malignant neoplasm of unspecified part of right bronchus or lung: Secondary | ICD-10-CM | POA: Diagnosis not present

## 2023-05-28 DIAGNOSIS — Z85118 Personal history of other malignant neoplasm of bronchus and lung: Secondary | ICD-10-CM | POA: Diagnosis not present

## 2023-05-28 DIAGNOSIS — J440 Chronic obstructive pulmonary disease with acute lower respiratory infection: Secondary | ICD-10-CM | POA: Diagnosis present

## 2023-05-28 DIAGNOSIS — K922 Gastrointestinal hemorrhage, unspecified: Secondary | ICD-10-CM

## 2023-05-28 DIAGNOSIS — H02402 Unspecified ptosis of left eyelid: Secondary | ICD-10-CM | POA: Diagnosis present

## 2023-05-28 DIAGNOSIS — Z79899 Other long term (current) drug therapy: Secondary | ICD-10-CM

## 2023-05-28 DIAGNOSIS — Z923 Personal history of irradiation: Secondary | ICD-10-CM

## 2023-05-28 DIAGNOSIS — Z9049 Acquired absence of other specified parts of digestive tract: Secondary | ICD-10-CM

## 2023-05-28 DIAGNOSIS — Z8041 Family history of malignant neoplasm of ovary: Secondary | ICD-10-CM

## 2023-05-28 DIAGNOSIS — K921 Melena: Secondary | ICD-10-CM | POA: Diagnosis present

## 2023-05-28 DIAGNOSIS — Z7951 Long term (current) use of inhaled steroids: Secondary | ICD-10-CM

## 2023-05-28 DIAGNOSIS — K5730 Diverticulosis of large intestine without perforation or abscess without bleeding: Secondary | ICD-10-CM | POA: Diagnosis present

## 2023-05-28 DIAGNOSIS — Z808 Family history of malignant neoplasm of other organs or systems: Secondary | ICD-10-CM

## 2023-05-28 DIAGNOSIS — Z791 Long term (current) use of non-steroidal anti-inflammatories (NSAID): Secondary | ICD-10-CM | POA: Diagnosis not present

## 2023-05-28 DIAGNOSIS — I351 Nonrheumatic aortic (valve) insufficiency: Secondary | ICD-10-CM | POA: Diagnosis present

## 2023-05-28 LAB — COMPREHENSIVE METABOLIC PANEL
ALT: 12 U/L (ref 0–44)
AST: 18 U/L (ref 15–41)
Albumin: 3.3 g/dL — ABNORMAL LOW (ref 3.5–5.0)
Alkaline Phosphatase: 185 U/L — ABNORMAL HIGH (ref 38–126)
Anion gap: 9 (ref 5–15)
BUN: 11 mg/dL (ref 8–23)
CO2: 34 mmol/L — ABNORMAL HIGH (ref 22–32)
Calcium: 8.8 mg/dL — ABNORMAL LOW (ref 8.9–10.3)
Chloride: 99 mmol/L (ref 98–111)
Creatinine, Ser: 0.53 mg/dL (ref 0.44–1.00)
GFR, Estimated: >60 mL/min (ref >60)
Glucose, Bld: 99 mg/dL (ref 70–99)
Potassium: 3.9 mmol/L (ref 3.5–5.1)
Sodium: 142 mmol/L (ref 135–145)
Total Bilirubin: 0.2 mg/dL (ref 0.0–1.2)
Total Protein: 6.8 g/dL (ref 6.5–8.1)

## 2023-05-28 LAB — CBC WITH DIFFERENTIAL/PLATELET
Abs Immature Granulocytes: 0.03 10*3/uL (ref 0.00–0.07)
Basophils Absolute: 0 10*3/uL (ref 0.0–0.1)
Basophils Relative: 0 %
Eosinophils Absolute: 0.1 10*3/uL (ref 0.0–0.5)
Eosinophils Relative: 1 %
HCT: 28.1 % — ABNORMAL LOW (ref 36.0–46.0)
Hemoglobin: 9 g/dL — ABNORMAL LOW (ref 12.0–15.0)
Immature Granulocytes: 0 %
Lymphocytes Relative: 5 %
Lymphs Abs: 0.4 10*3/uL — ABNORMAL LOW (ref 0.7–4.0)
MCH: 29.3 pg (ref 26.0–34.0)
MCHC: 32 g/dL (ref 30.0–36.0)
MCV: 91.5 fL (ref 80.0–100.0)
Monocytes Absolute: 0.5 10*3/uL (ref 0.1–1.0)
Monocytes Relative: 6 %
Neutro Abs: 7.4 10*3/uL (ref 1.7–7.7)
Neutrophils Relative %: 88 %
Platelets: 270 10*3/uL (ref 150–400)
RBC: 3.07 MIL/uL — ABNORMAL LOW (ref 3.87–5.11)
RDW: 16 % — ABNORMAL HIGH (ref 11.5–15.5)
WBC: 8.5 10*3/uL (ref 4.0–10.5)
nRBC: 0 % (ref 0.0–0.2)

## 2023-05-28 LAB — RESP PANEL BY RT-PCR (RSV, FLU A&B, COVID)  RVPGX2
Influenza A by PCR: NEGATIVE
Influenza B by PCR: NEGATIVE
Resp Syncytial Virus by PCR: NEGATIVE
SARS Coronavirus 2 by RT PCR: NEGATIVE

## 2023-05-28 LAB — LIPASE, BLOOD: Lipase: 19 U/L (ref 11–51)

## 2023-05-28 LAB — TROPONIN I (HIGH SENSITIVITY)
Troponin I (High Sensitivity): 4 ng/L (ref <18)
Troponin I (High Sensitivity): 3 ng/L (ref <18)

## 2023-05-28 LAB — TYPE AND SCREEN
ABO/RH(D): O POS
Antibody Screen: NEGATIVE

## 2023-05-28 LAB — BRAIN NATRIURETIC PEPTIDE: B Natriuretic Peptide: 100 pg/mL (ref 0.0–100.0)

## 2023-05-28 LAB — MRSA NEXT GEN BY PCR, NASAL: MRSA by PCR Next Gen: NOT DETECTED

## 2023-05-28 MED ORDER — CHLORHEXIDINE GLUCONATE CLOTH 2 % EX PADS
6.0000 | MEDICATED_PAD | Freq: Every day | CUTANEOUS | Status: DC
  Administered 2023-05-29 – 2023-06-03 (×6): 6 via TOPICAL

## 2023-05-28 MED ORDER — PANTOPRAZOLE SODIUM 40 MG IV SOLR
40.0000 mg | Freq: Two times a day (BID) | INTRAVENOUS | Status: DC
  Administered 2023-05-28 – 2023-06-03 (×12): 40 mg via INTRAVENOUS
  Filled 2023-05-28 (×12): qty 10

## 2023-05-28 MED ORDER — TAMSULOSIN HCL 0.4 MG PO CAPS
0.4000 mg | ORAL_CAPSULE | Freq: Every day | ORAL | Status: DC
  Administered 2023-05-29 – 2023-06-02 (×5): 0.4 mg via ORAL
  Filled 2023-05-28 (×5): qty 1

## 2023-05-28 MED ORDER — IPRATROPIUM-ALBUTEROL 0.5-2.5 (3) MG/3ML IN SOLN: 3.0000 mL | Freq: Once | RESPIRATORY_TRACT | Status: DC

## 2023-05-28 MED ORDER — IPRATROPIUM-ALBUTEROL 0.5-2.5 (3) MG/3ML IN SOLN
3.0000 mL | Freq: Four times a day (QID) | RESPIRATORY_TRACT | Status: DC
  Administered 2023-05-28 – 2023-06-03 (×23): 3 mL via RESPIRATORY_TRACT
  Filled 2023-05-28 (×22): qty 3

## 2023-05-28 MED ORDER — METHYLPREDNISOLONE SODIUM SUCC 125 MG IJ SOLR
60.0000 mg | Freq: Two times a day (BID) | INTRAMUSCULAR | Status: DC
  Administered 2023-05-29 – 2023-06-03 (×11): 60 mg via INTRAVENOUS
  Filled 2023-05-28 (×11): qty 2

## 2023-05-28 MED ORDER — OXYCODONE HCL 5 MG PO TABS
5.0000 mg | ORAL_TABLET | ORAL | Status: DC | PRN
  Administered 2023-05-30 – 2023-05-31 (×2): 5 mg via ORAL
  Filled 2023-05-28 (×2): qty 1

## 2023-05-28 MED ORDER — ACETAMINOPHEN 325 MG PO TABS
650.0000 mg | ORAL_TABLET | Freq: Four times a day (QID) | ORAL | Status: DC | PRN
  Administered 2023-05-29 – 2023-06-03 (×8): 650 mg via ORAL
  Filled 2023-05-28 (×8): qty 2

## 2023-05-28 MED ORDER — ACETAMINOPHEN 650 MG RE SUPP: 650.0000 mg | Freq: Four times a day (QID) | RECTAL | Status: DC | PRN

## 2023-05-28 MED ORDER — IOHEXOL 350 MG/ML SOLN
100.0000 mL | Freq: Once | INTRAVENOUS | Status: AC | PRN
  Administered 2023-05-28: 100 mL via INTRAVENOUS

## 2023-05-28 MED ORDER — BUDESONIDE 0.25 MG/2ML IN SUSP
0.2500 mg | Freq: Two times a day (BID) | RESPIRATORY_TRACT | Status: DC
  Administered 2023-05-28 – 2023-06-03 (×12): 0.25 mg via RESPIRATORY_TRACT
  Filled 2023-05-28 (×12): qty 2

## 2023-05-28 MED ORDER — DULOXETINE HCL 60 MG PO CPEP
60.0000 mg | ORAL_CAPSULE | Freq: Two times a day (BID) | ORAL | Status: DC
  Administered 2023-05-29 – 2023-06-03 (×11): 60 mg via ORAL
  Filled 2023-05-28 (×12): qty 1

## 2023-05-28 MED ORDER — SODIUM CHLORIDE 0.9 % IV SOLN
500.0000 mg | Freq: Once | INTRAVENOUS | Status: AC
  Administered 2023-05-28: 500 mg via INTRAVENOUS
  Filled 2023-05-28: qty 5

## 2023-05-28 MED ORDER — IPRATROPIUM-ALBUTEROL 0.5-2.5 (3) MG/3ML IN SOLN
9.0000 mL | Freq: Once | RESPIRATORY_TRACT | Status: AC
  Administered 2023-05-28: 9 mL via RESPIRATORY_TRACT
  Filled 2023-05-28: qty 9

## 2023-05-28 MED ORDER — SODIUM CHLORIDE 0.9 % IV SOLN: 2.0000 g | INTRAVENOUS | Status: DC

## 2023-05-28 MED ORDER — ONDANSETRON HCL 4 MG PO TABS: 4.0000 mg | ORAL_TABLET | Freq: Four times a day (QID) | ORAL | Status: DC | PRN

## 2023-05-28 MED ORDER — SENNOSIDES-DOCUSATE SODIUM 8.6-50 MG PO TABS: 1.0000 | ORAL_TABLET | Freq: Every evening | ORAL | Status: DC | PRN

## 2023-05-28 MED ORDER — SODIUM CHLORIDE 0.9 % IV SOLN
500.0000 mg | INTRAVENOUS | Status: AC
  Administered 2023-05-29 – 2023-06-01 (×4): 500 mg via INTRAVENOUS
  Filled 2023-05-28 (×4): qty 5

## 2023-05-28 MED ORDER — ARFORMOTEROL TARTRATE 15 MCG/2ML IN NEBU
15.0000 ug | INHALATION_SOLUTION | Freq: Two times a day (BID) | RESPIRATORY_TRACT | Status: DC
  Administered 2023-05-28 – 2023-06-03 (×12): 15 ug via RESPIRATORY_TRACT
  Filled 2023-05-28 (×12): qty 2

## 2023-05-28 MED ORDER — SODIUM CHLORIDE 0.9 % IV SOLN
1.0000 g | Freq: Once | INTRAVENOUS | Status: AC
  Administered 2023-05-28: 1 g via INTRAVENOUS
  Filled 2023-05-28: qty 10

## 2023-05-28 MED ORDER — ONDANSETRON HCL 4 MG/2ML IJ SOLN: 4.0000 mg | Freq: Four times a day (QID) | INTRAMUSCULAR | Status: DC | PRN

## 2023-05-28 MED ORDER — METHYLPREDNISOLONE SODIUM SUCC 125 MG IJ SOLR
125.0000 mg | Freq: Once | INTRAMUSCULAR | Status: AC
  Administered 2023-05-28: 125 mg via INTRAVENOUS
  Filled 2023-05-28: qty 2

## 2023-05-28 MED ORDER — ENSURE ENLIVE PO LIQD
237.0000 mL | Freq: Three times a day (TID) | ORAL | Status: DC
  Administered 2023-05-29 – 2023-06-03 (×12): 237 mL via ORAL
  Filled 2023-05-28 (×3): qty 237

## 2023-05-28 MED ORDER — FENTANYL CITRATE PF 50 MCG/ML IJ SOSY: 12.5000 ug | PREFILLED_SYRINGE | INTRAMUSCULAR | Status: DC | PRN

## 2023-05-28 MED ORDER — PANTOPRAZOLE SODIUM 40 MG IV SOLR
40.0000 mg | Freq: Once | INTRAVENOUS | Status: AC
  Administered 2023-05-28: 40 mg via INTRAVENOUS
  Filled 2023-05-28: qty 10

## 2023-05-28 NOTE — Hospital Course (Addendum)
73 year old female with COPD, chronic hypoxic respiratory failure, oxygen dependent on 4 L/min, history of recurrent non-small cell lung cancer, coronary artery disease history of NSTEMI, history of gastric ulcers, history of duodenal AVMs, currently on surveillance therapy for lung cancer after immunotherapy treatments, receiving CT scans every 4 months, reportedly has been having increasing shortness of breath symptoms over the past week that became acutely worse overnight.  She presented to the emergency department today with worsening shortness of breath symptoms and also reported lower abdominal pain.  She reports needing more than her baseline O2 requirement of 4 L/min had had increased it to 6 L/min had had oxygen saturation around 91%.  She reportedly was 87% on 4 L/min.  She was given bronchodilators in the emergency department with some improvement but not back to baseline.  She is reporting nonproductive cough at this time.  Influenza, RSV and SARS 2 coronavirus testing negative.  In the ED she received imaging studies with findings consistent with pneumonia.  CTA chest was negative for PE.  She was also noted to have a drop in her baseline hemoglobin and reported having intermittent melanotic stools.  Patient was started on broad-spectrum antibiotic therapy in addition to scheduled bronchodilator therapy and steroids and admission requested.  GI was consulted and recommended patient remain n.p.o. until GI consultation and started on IV pantoprazole for GI protection. After the initial drop in hemoglobin, her hemoglobin largely remained stable in the 8 range.  They recommended PPI twice daily and will arrange outpatient follow-up.  They recommended endoscopy in the outpatient setting, unless the patient has overt GI bleed or worsening anemia.

## 2023-05-28 NOTE — H&P (Signed)
History and Physical  [facility]  [place] ZOX:[id number] DOB: 09/30/1950 DOA: 05/28/2023  PCP: Oneal Grout, FNP  Patient coming from: Home  Level of care: Stepdown  I have personally briefly reviewed patient's old medical records in [facility]  Chief Complaint: SOB   HPI: Courtney Zuniga is a 74-year-old female with COPD, chronic hypoxic respiratory failure, oxygen dependent on 4 L/min, history of recurrent non-small cell lung cancer, coronary artery disease history of NSTEMI, history of gastric ulcers, history of duodenal AVMs, currently on surveillance therapy for lung cancer after immunotherapy treatments, receiving CT scans every 4 months, reportedly has been having increasing shortness of breath symptoms over the past week that became acutely worse overnight.  She presented to the emergency department today with worsening shortness of breath symptoms and also reported lower abdominal pain.  She reports needing more than her baseline O2 requirement of 4 L/min had had increased it to 6 L/min had had oxygen saturation around 91%.  She reportedly was 87% on 4 L/min.  She was given bronchodilators in the emergency department with some improvement but not back to baseline.  She is reporting nonproductive cough at this time.  Influenza, RSV and SARS 2 coronavirus testing negative.  In the ED she received imaging studies with findings consistent with pneumonia.  She was also noted to have a drop in her baseline hemoglobin and reported having intermittent melanotic stools.  Patient was started on broad-spectrum antibiotic therapy in addition to scheduled bronchodilator therapy and steroids and admission requested.  GI was consulted and recommended patient remain n.p.o. until GI consultation and started on IV pantoprazole for GI protection.   Past Medical History:  Diagnosis Date   Anemia    as a young woman   Arthritis    osteoartritis   Asthma    Brain tumor (benign) (HCC)  2005 [facility]   Benign   Chronic headaches    Chronic hip pain    Chronic pain    COPD (chronic obstructive pulmonary disease) (HCC)    Coronary artery disease    Depression    Depression 05/15/2016   Encounter for antineoplastic chemotherapy 01/10/2016   GERD (gastroesophageal reflux disease)    Hypertension    Lung cancer (HCC) dx'd 01/2016   currently on chemo and radiation    NSTEMI (non-ST elevated myocardial infarction) (HCC) yrs ago   On home O2    qhs 2 liters at hs and prn   Pneumonia last time 2 yrs ago   Shortness of breath dyspnea    with activity    Past Surgical History:  Procedure Laterality Date   CHOLECYSTECTOMY     COLONOSCOPY  2015   Results requested from [facility]   COLONOSCOPY     ESOPHAGOGASTRODUODENOSCOPY N/A 08/14/2015   Procedure: ESOPHAGOGASTRODUODENOSCOPY (EGD);  Surgeon: Corbin Ade, MD;  [facility];  Service: Endoscopy;  Laterality: N/A;  215    ESOPHAGOGASTRODUODENOSCOPY (EGD) WITH PROPOFOL N/A 09/13/2015   Procedure: ESOPHAGOGASTRODUODENOSCOPY (EGD) WITH PROPOFOL;  Surgeon: Rachael Fee, MD;  Location: [facility];  Service: Endoscopy;  Laterality: N/A;   ESOPHAGOGASTRODUODENOSCOPY (EGD) WITH PROPOFOL N/A 05/19/2022   Procedure: ESOPHAGOGASTRODUODENOSCOPY (EGD) WITH PROPOFOL;  Surgeon: Lanelle Bal, DO;  Location: [facility];  Service: Endoscopy;  Laterality: N/A;   EUS N/A 03/12/2017   Procedure: UPPER ENDOSCOPIC ULTRASOUND (EUS) RADIAL;  Surgeon: Rachael Fee, MD;  Location: [facility];  Service: Endoscopy;  Laterality: N/A;   HOT HEMOSTASIS  05/19/2022   Procedure: HOT HEMOSTASIS (ARGON PLASMA COAGULATION/BICAP);  Surgeon: Lanelle Bal, DO;  Location: [facility];  Service: Endoscopy;;   IR IMAGING GUIDED PORT INSERTION  01/20/2022   TUMOR REMOVAL  2005   Benign   UPPER ESOPHAGEAL ENDOSCOPIC ULTRASOUND (EUS)  09/13/2015   Procedure: UPPER ESOPHAGEAL ENDOSCOPIC ULTRASOUND (EUS);  Surgeon: Rachael Fee, MD;  Location: [facility];  Service: Endoscopy;;   VIDEO BRONCHOSCOPY WITH ENDOBRONCHIAL NAVIGATION N/A 12/31/2015   Procedure: VIDEO BRONCHOSCOPY WITH ENDOBRONCHIAL NAVIGATION;  Surgeon: Loreli Slot, MD;  Location: [facility] OR;  Service: Thoracic;  Laterality: N/A;   VIDEO BRONCHOSCOPY WITH ENDOBRONCHIAL ULTRASOUND N/A 11/08/2015   Procedure: VIDEO BRONCHOSCOPY WITH ENDOBRONCHIAL ULTRASOUND;  Surgeon: Kerin Perna, MD;  Location: [facility] OR;  Service: Thoracic;  Laterality: N/A;     reports that she quit smoking about 4 years ago. Her smoking use included cigarettes. She started smoking about 42 years ago. She has a 38 pack-year smoking history. She has never used smokeless tobacco. She reports that she does not drink alcohol and does not use drugs.  Allergies  Allergen Reactions   Desyrel [Trazodone] Other (See Comments)    "Made me faint"    Family History  Problem Relation Age of Onset   Ovarian cancer Mother    Lung cancer Father    Brain cancer Sister        Half sister   Lung cancer Brother    Lung cancer Brother    Prostate cancer Brother    Colon cancer Neg Hx    Colon polyps Neg Hx     Prior to Admission medications   Medication Sig Start Date End Date Taking? Authorizing Provider  acetaminophen (TYLENOL) 325 MG tablet Take 2 tablets (650 mg total) by mouth every 6 (six) hours as needed for mild pain, fever or headache. 05/08/18   Mariea Clonts, Courage, MD  albuterol (PROVENTIL) (2.5 MG/3ML) 0.083% nebulizer solution Take 3 mLs (2.5 mg total) by nebulization 3 (three) times daily as needed for shortness of breath or wheezing. 08/28/22   Shon Hale, MD  albuterol (VENTOLIN HFA) 108 (90 Base) MCG/ACT inhaler Inhale 2 puffs into the lungs every 4 (four) hours as needed for shortness of breath. 08/28/22   Shon Hale, MD  Aspirin-Acetaminophen-Caffeine (GOODYS EXTRA STRENGTH PO) Take 1 packet by mouth daily as needed (headaches).    [provider]   bisoprolol (ZEBETA) 5 MG tablet Take 0.5 tablets (2.5 mg total) by mouth daily. Patient taking differently: Take 2.5 mg by mouth daily as needed (high bp). 10/13/22   Mallipeddi, Vishnu P, MD  BREO ELLIPTA 200-25 MCG/ACT AEPB Inhale 1 puff into the lungs daily. 01/21/23   [provider]  Docusate Sodium (DSS) 100 MG CAPS Take 1 capsule by mouth daily. 10/14/22   [provider]  doxycycline (VIBRAMYCIN) 100 MG capsule Take 1 capsule (100 mg total) by mouth 2 (two) times daily. One po bid x 7 days 02/23/23   Bethann Berkshire, MD  DULoxetine (CYMBALTA) 60 MG capsule Take 1 capsule (60 mg total) by mouth 2 (two) times daily. 08/28/22   Emokpae, Courage, MD  ENSURE (ENSURE) Take 237 mLs by mouth 3 (three) times daily between meals.    [provider]  FeFum-FePoly-FA-B Cmp-C-Biot (INTEGRA PLUS) CAPS Take 1 capsule by mouth every morning. 08/28/22   Shon Hale, MD  furosemide (LASIX) 20 MG tablet Take 1 tablet (20 mg total) by mouth daily as needed (3 pound weight gain).  10/08/22   Catarina Hartshorn, MD  Ipratropium-Albuterol (COMBIVENT RESPIMAT) 20-100 MCG/ACT AERS respimat Inhale 1 puff into the lungs every 6 (six) hours as needed for wheezing or shortness of breath. 08/28/22   Shon Hale, MD  Multiple Vitamins-Minerals (MULTIVITAMIN WITH MINERALS) tablet Take 1 tablet by mouth daily. 08/28/22 08/28/23  Shon Hale, MD  oxyCODONE-acetaminophen (PERCOCET/ROXICET) 5-325 MG tablet Take 1 tablet by mouth every 8 (eight) hours as needed for moderate pain. 05/08/18   Shon Hale, MD  pantoprazole (PROTONIX) 40 MG tablet Take 1 tablet (40 mg total) by mouth 2 (two) times daily. 08/28/22 08/28/23  Shon Hale, MD  predniSONE (DELTASONE) 10 MG tablet Take 2 tablets (20 mg total) by mouth daily. 02/23/23   Bethann Berkshire, MD  tamsulosin (FLOMAX) 0.4 MG CAPS capsule Take 1 capsule (0.4 mg total) by mouth daily after supper. 08/28/22   Shon Hale, MD  topiramate (TOPAMAX) 25 MG  tablet Take 1 tablet (25 mg total) by mouth 2 (two) times daily. 08/28/22   Shon Hale, MD  traZODone (DESYREL) 50 MG tablet Take 1 tablet (50 mg total) by mouth at bedtime. 08/28/22   Emokpae, Courage, MD  TRELEGY ELLIPTA 100-62.5-25 MCG/ACT AEPB Inhale 1 puff into the lungs daily. 08/28/22   Shon Hale, MD  Vitamin D, Ergocalciferol, (DRISDOL) 1.25 MG (50000 UNIT) CAPS capsule Take 1 capsule (50,000 Units total) by mouth once a week. 08/28/22   Shon Hale, MD  HYDROcodone bit-homatropine (HYCODAN) 5-1.5 MG/5ML syrup Take 5 mLs by mouth every 6 (six) hours as needed for cough. 11/10/21   Si Gaul, MD    Physical Exam: Vitals:   05/28/23 0915 05/28/23 0930 05/28/23 0940 05/28/23 1000  BP: 116/69   129/74  Pulse: 99 (!) 102  (!) 105  Resp: (!) 26     Temp:      TempSrc:      SpO2: 96% 100% 99% 94%  Weight:      Height:       Constitutional: NAD, calm, comfortable Eyes: PERRL, lids and conjunctivae normal ENMT: Mucous membranes are moist. Posterior pharynx clear of any exudate or lesions.Normal dentition.  Neck: normal, supple, no masses, no thyromegaly Respiratory: poor air movement, moderate increased work of breathing, diffuse expiratory wheezing heard.   Cardiovascular: normal s1, s2 sounds, no murmurs / rubs / gallops. No extremity edema. 2+ pedal pulses. No carotid bruits.  Abdomen: no tenderness, no masses palpated. No hepatosplenomegaly. Bowel sounds positive.  Musculoskeletal: no clubbing / cyanosis. No joint deformity upper and lower extremities. Good ROM, no contractures. Normal muscle tone.  Skin: no rashes, lesions, ulcers. No induration Neurologic: CN 2-12 grossly intact. Sensation intact, DTR normal. Strength 5/5 in all 4.  Psychiatric: Normal judgment and insight. Somnolent but arousable. Normal mood.   Labs on Admission: I have personally reviewed following labs and imaging studies  CBC: Recent Labs  Lab 05/28/23 0908  WBC 8.5  NEUTROABS 7.4   HGB 9.0*  HCT 28.1*  MCV 91.5  PLT 270   Basic Metabolic Panel: Recent Labs  Lab 05/28/23 0908  NA 142  K 3.9  CL 99  CO2 34*  GLUCOSE 99  BUN 11  CREATININE 0.53  CALCIUM 8.8*   GFR: Estimated Creatinine Clearance: 55.9 mL/min (by C-G formula based on SCr of 0.53 mg/dL). Liver Function Tests: Recent Labs  Lab 05/28/23 0908  AST 18  ALT 12  ALKPHOS 185*  BILITOT 0.2  PROT 6.8  ALBUMIN 3.3*   Recent Labs  Lab 05/28/23  5284  LIPASE 19   No results for input(s): "AMMONIA" in the last 168 hours. Coagulation Profile: No results for input(s): "INR", "PROTIME" in the last 168 hours. Cardiac Enzymes: No results for input(s): "CKTOTAL", "CKMB", "CKMBINDEX", "TROPONINI" in the last 168 hours. BNP (last 3 results) No results for input(s): "PROBNP" in the last 8760 hours. HbA1C: No results for input(s): "HGBA1C" in the last 72 hours. CBG: No results for input(s): "GLUCAP" in the last 168 hours. Lipid Profile: No results for input(s): "CHOL", "HDL", "LDLCALC", "TRIG", "CHOLHDL", "LDLDIRECT" in the last 72 hours. Thyroid Function Tests: No results for input(s): "TSH", "T4TOTAL", "FREET4", "T3FREE", "THYROIDAB" in the last 72 hours. Anemia Panel: No results for input(s): "VITAMINB12", "FOLATE", "FERRITIN", "TIBC", "IRON", "RETICCTPCT" in the last 72 hours. Urine analysis:    Component Value Date/Time   COLORURINE YELLOW 07/16/2022 1928   APPEARANCEUR CLEAR 07/16/2022 1928   LABSPEC 1.014 07/16/2022 1928   PHURINE 5.0 07/16/2022 1928   GLUCOSEU NEGATIVE 07/16/2022 1928   HGBUR NEGATIVE 07/16/2022 1928   BILIRUBINUR NEGATIVE 07/16/2022 1928   KETONESUR NEGATIVE 07/16/2022 1928   PROTEINUR NEGATIVE 07/16/2022 1928   UROBILINOGEN 0.2 03/31/2014 1406   NITRITE NEGATIVE 07/16/2022 1928   LEUKOCYTESUR NEGATIVE 07/16/2022 1928    Radiological Exams on Admission: CT Angio Chest PE W/Cm &/Or Wo Cm Result Date: 05/28/2023 CLINICAL DATA:  History of non-small-cell lung  cancer with shortness of breath and abdominal pain EXAM: CT ANGIOGRAPHY CHEST CT ABDOMEN AND PELVIS WITH CONTRAST TECHNIQUE: Multidetector CT imaging of the chest was performed using the standard protocol during bolus administration of intravenous contrast. Multiplanar CT image reconstructions and MIPs were obtained to evaluate the vascular anatomy. Multidetector CT imaging of the abdomen and pelvis was performed using the standard protocol during bolus administration of intravenous contrast. RADIATION DOSE REDUCTION: This exam was performed according to the departmental dose-optimization program which includes automated exposure control, adjustment of the mA and/or kV according to patient size and/or use of iterative reconstruction technique. CONTRAST:  OMNIPAQUE IOHEXOL 350 MG/ML SOLN COMPARISON:  CT chest, abdomen, and pelvis dated 03/31/2023 FINDINGS: CTA CHEST FINDINGS Cardiovascular: Right chest wall port terminates in the right atrium. The study is high quality for the evaluation of pulmonary embolism. There are no filling defects in the central, lobar, segmental or subsegmental pulmonary artery branches to suggest acute pulmonary embolism. Great vessels are normal in course and caliber. Normal heart size. No significant pericardial fluid/thickening. Coronary artery calcifications. Mediastinum/Nodes: Imaged thyroid gland without nodules meeting criteria for imaging follow-up by size. Normal esophagus. No pathologically enlarged axillary, supraclavicular, mediastinal, or hilar lymph nodes. Lungs/Pleura: The central airways are patent. Increased peribronchial wall thickening, most notable in the right lower lobe with scattered areas of subsegmental mucous plugging in the bilateral lower lobes. New irregular bilateral lower lobe consolidations on a background of bilateral lower lobe scarring/atelectasis. Unchanged severe centrilobular and paraseptal emphysema. Similar appearance of right perihilar and  infrahilar postradiation changes. Decreased size of irregular right apical 1.6 x 1.4 cm nodule (5:27), previously 2.0 x 1.2 cm. No pneumothorax. Small right pleural effusion. Musculoskeletal: No acute or abnormal lytic or blastic osseous lesions. Multilevel degenerative changes of the thoracic spine. Review of the MIP images confirms the above findings. CT ABDOMEN and PELVIS FINDINGS Somewhat suboptimal timing of contrast bolus. Hepatobiliary: No focal hepatic lesions. Mild intra and extrahepatic bile duct dilation, likely secondary to cholecystectomy. Pancreas: No focal lesions or main ductal dilation. Spleen: Normal in size without focal abnormality. Adrenals/Urinary Tract: No adrenal  nodules. No suspicious renal mass, calculi, or hydronephrosis. No focal bladder wall thickening. Stomach/Bowel: Normal appearance of the stomach. No evidence of bowel wall thickening, distention, or inflammatory changes. Colonic diverticulosis without acute diverticulitis. Appendix is not discretely seen. Vascular/Lymphatic: Aortic atherosclerosis. No enlarged abdominal or pelvic lymph nodes. Reproductive: Mildly lobulated uterus with multifocal coarse calcifications, likely leiomyomata. No definite adnexal masses. Other: No free fluid, fluid collection, or free air. Musculoskeletal: No acute or abnormal lytic or blastic osseous lesions. Multilevel degenerative changes of the lumbar spine. IMPRESSION: 1. No evidence of pulmonary embolism. 2. Increased peribronchial wall thickening, most notable in the right lower lobe with scattered areas of subsegmental mucous plugging in the bilateral lower lobes, likely bronchitis. 3. New irregular bilateral lower lobe consolidations on a background of bilateral lower lobe scarring/atelectasis, suspicious for pneumonia. 4. Small right pleural effusion. 5. Slightly decreased size of irregular right apical nodule. 6. No acute findings in the abdomen or pelvis. 7. Colonic diverticulosis without acute  diverticulitis. 8. Aortic Atherosclerosis (ICD10-I70.0) and Emphysema (ICD10-J43.9). Electronically Signed   By: Agustin Cree M.D.   On: 05/28/2023 12:05   CT ABDOMEN PELVIS W CONTRAST Result Date: 05/28/2023 CLINICAL DATA:  History of non-small-cell lung cancer with shortness of breath and abdominal pain EXAM: CT ANGIOGRAPHY CHEST CT ABDOMEN AND PELVIS WITH CONTRAST TECHNIQUE: Multidetector CT imaging of the chest was performed using the standard protocol during bolus administration of intravenous contrast. Multiplanar CT image reconstructions and MIPs were obtained to evaluate the vascular anatomy. Multidetector CT imaging of the abdomen and pelvis was performed using the standard protocol during bolus administration of intravenous contrast. RADIATION DOSE REDUCTION: This exam was performed according to the departmental dose-optimization program which includes automated exposure control, adjustment of the mA and/or kV according to patient size and/or use of iterative reconstruction technique. CONTRAST:  OMNIPAQUE IOHEXOL 350 MG/ML SOLN COMPARISON:  CT chest, abdomen, and pelvis dated 03/31/2023 FINDINGS: CTA CHEST FINDINGS Cardiovascular: Right chest wall port terminates in the right atrium. The study is high quality for the evaluation of pulmonary embolism. There are no filling defects in the central, lobar, segmental or subsegmental pulmonary artery branches to suggest acute pulmonary embolism. Great vessels are normal in course and caliber. Normal heart size. No significant pericardial fluid/thickening. Coronary artery calcifications. Mediastinum/Nodes: Imaged thyroid gland without nodules meeting criteria for imaging follow-up by size. Normal esophagus. No pathologically enlarged axillary, supraclavicular, mediastinal, or hilar lymph nodes. Lungs/Pleura: The central airways are patent. Increased peribronchial wall thickening, most notable in the right lower lobe with scattered areas of subsegmental  mucous plugging in the bilateral lower lobes. New irregular bilateral lower lobe consolidations on a background of bilateral lower lobe scarring/atelectasis. Unchanged severe centrilobular and paraseptal emphysema. Similar appearance of right perihilar and infrahilar postradiation changes. Decreased size of irregular right apical 1.6 x 1.4 cm nodule (5:27), previously 2.0 x 1.2 cm. No pneumothorax. Small right pleural effusion. Musculoskeletal: No acute or abnormal lytic or blastic osseous lesions. Multilevel degenerative changes of the thoracic spine. Review of the MIP images confirms the above findings. CT ABDOMEN and PELVIS FINDINGS Somewhat suboptimal timing of contrast bolus. Hepatobiliary: No focal hepatic lesions. Mild intra and extrahepatic bile duct dilation, likely secondary to cholecystectomy. Pancreas: No focal lesions or main ductal dilation. Spleen: Normal in size without focal abnormality. Adrenals/Urinary Tract: No adrenal nodules. No suspicious renal mass, calculi, or hydronephrosis. No focal bladder wall thickening. Stomach/Bowel: Normal appearance of the stomach. No evidence of bowel wall thickening, distention, or inflammatory changes.  Colonic diverticulosis without acute diverticulitis. Appendix is not discretely seen. Vascular/Lymphatic: Aortic atherosclerosis. No enlarged abdominal or pelvic lymph nodes. Reproductive: Mildly lobulated uterus with multifocal coarse calcifications, likely leiomyomata. No definite adnexal masses. Other: No free fluid, fluid collection, or free air. Musculoskeletal: No acute or abnormal lytic or blastic osseous lesions. Multilevel degenerative changes of the lumbar spine. IMPRESSION: 1. No evidence of pulmonary embolism. 2. Increased peribronchial wall thickening, most notable in the right lower lobe with scattered areas of subsegmental mucous plugging in the bilateral lower lobes, likely bronchitis. 3. New irregular bilateral lower lobe consolidations on a  background of bilateral lower lobe scarring/atelectasis, suspicious for pneumonia. 4. Small right pleural effusion. 5. Slightly decreased size of irregular right apical nodule. 6. No acute findings in the abdomen or pelvis. 7. Colonic diverticulosis without acute diverticulitis. 8. Aortic Atherosclerosis (ICD10-I70.0) and Emphysema (ICD10-J43.9). Electronically Signed   By: Agustin Cree M.D.   On: 05/28/2023 12:05   DG Chest 2 View Result Date: 05/28/2023 CLINICAL DATA:  Shortness of breath. EXAM: CHEST - 2 VIEW COMPARISON:  Chest radiograph dated 02/23/2023. CT chest dated 03/31/2023. FINDINGS: Stable cardiomediastinal silhouette. Aortic atherosclerosis. Stable right chest Port-A-Cath tip overlies the superior cavoatrial junction. Similar radiation changes are again noted in the right perihilar region. The spiculated nodule in the right upper lobe is not well visualized on this exam. Small right pleural effusion with basilar atelectasis. Streaky opacities at the right lung base. Hyperinflation with similar diffuse coarsened interstitial markings. No pneumothorax. No acute osseous abnormality. IMPRESSION: 1. Small right pleural effusion with basilar atelectasis. Streaky opacities at the right lung base could reflect atelectasis/scarring, however, infiltrate can not be excluded. 2. Emphysema. Electronically Signed   By: Hart Robinsons M.D.   On: 05/28/2023 10:37    EKG: Independently reviewed.  Normal sinus rhythm no acute ST-T wave abnormalities  Assessment/Plan Principal Problem:   Acute and chronic respiratory failure with hypoxia (HCC) Active Problems:   HTN (hypertension)   COPD with acute exacerbation (HCC)   CAP (community acquired pneumonia)   Adenocarcinoma of right lung, stage 4   Severe protein-calorie malnutrition (HCC)   GERD (gastroesophageal reflux disease)   Acute on chronic anemia   History of Gastric ulcer   AVM (arteriovenous malformation) of small bowel, acquired   Sinus  tachycardia   (HFpEF) heart failure with preserved ejection fraction (HCC)   Aortic regurgitation   Nicotine abuse   Acute on chronic respiratory failure with hypoxia -Secondary to community-acquired pneumonia and COPD with acute exacerbation -Continue broad-spectrum antibiotic treatment as noted -Continue scheduled bronchodilators and IV steroids -BiPAP as needed order in place and will monitor in stepdown ICU overnight -influenza, RSV, covid tests were negative   Community-acquired pneumonia -Continue azithromycin and ceftriaxone as ordered -Adding mucolytic's and cough suppressants -continue scheduled bronchodilators -follow blood cultures and sputum culture  COPD with acute exacerbation  -continue IV steroids and IV antibiotics -continue scheduled bronchodilators -continue mucolytics and cough suppressant -goal pulse ox is >88% given longstanding COPD and chronic hypoxia   Acute on chronic anemia -Hg down to 9 from baseline 11 -reporting melanotic stools -appreciate GI consultation and recommendations -IV pantoprazole for GI protection -NPO for now pending GI consultation -anemia panel ordered   Stage IIIa non-small cell lung cancer - Followed closely by Dr. Shirline Frees with CT scanning every 4 months - Currently on observation therapy as she has completed immunotherapy treatments  Question of recurrent GI bleed History of Gastric Ulcer History of duodenal AVMs -appreciate GI  consultation -pantoprazole ordered for GI protection -NPO for now pending GI evaluation   DVT prophylaxis: SCDs given concern for GI bleed   Code Status: Full  Family Communication:   Disposition Plan:   Consults called: GI   Admission status: INP   Critical Care Procedure Note Authorized and Performed by: Maryln Manuel MD  Total Critical Care time:  71 mins Due to a high probability of clinically significant, life threatening deterioration, the patient required my highest level of preparedness  to intervene emergently and I personally spent this critical care time directly and personally managing the patient.  This critical care time included obtaining a history; examining the patient, pulse oximetry; ordering and review of studies; arranging urgent treatment with development of a management plan; evaluation of patient's response of treatment; frequent reassessment; and discussions with other providers.  This critical care time was performed to assess and manage the high probability of imminent and life threatening deterioration that could result in multi-organ failure.  It was exclusive of separately billable procedures and treating other patients and teaching time.   Level of care: Stepdown Standley Dakins MD [facility] How to contact the [facility] Attending or Consulting provider 7A - 7P or covering provider during after hours 7P -7A, for this patient?  Check the care team in [facility] and look for a) attending/consulting [facility] provider listed and b) the [facility] team listed Log into [email or web address] and use [facility]'s universal password to access. If you do not have the password, please contact the hospital operator. Locate the [facility] provider you are looking for under [facility] and page to a number that you can be directly reached. If you still have difficulty reaching the provider, please page the [facility] (Director on Call) for the Hospitalists listed on amion for assistance.   If 7PM-7AM, please contact night-coverage [email or web address] Password [email or web address]  05/28/2023, 1:05 PM

## 2023-05-28 NOTE — ED Provider Notes (Signed)
[place] EMERGENCY DEPARTMENT AT [facility] Provider Note   CSN: [id number] Arrival date & time: 05/28/23  1308     History  Chief Complaint  Patient presents with   Shortness of Breath    Courtney Zuniga is a 73 y.o. female history of HFpEF, aortic regurg, aortic stenosis, lung cancer last chemo treatment in December, COPD, GERD, AVM of small bowel presented with shortness of breath has been present for the past week however got worse last night.  Patient states last week she was having lower abdominal pain as well however this resolved.  Patient denies any dysuria, hematuria, constipation or diarrhea or nausea vomiting, fevers.  Patient denies any chest pain but states that she is normally on 4 L nasal cannula however when EMS found her today she was 87% on 4 L and placed on 6 L and satting around 91%.  Patient did receive 2 rounds of albuterol with EMS due to wheezing and difficulty breathing.  Patient states that she is not coughing up any sputum but has a dry cough.  Home Medications Prior to Admission medications   Medication Sig Start Date End Date Taking? Authorizing Provider  acetaminophen (TYLENOL) 325 MG tablet Take 2 tablets (650 mg total) by mouth every 6 (six) hours as needed for mild pain, fever or headache. 05/08/18   Mariea Clonts, Courage, MD  albuterol (PROVENTIL) (2.5 MG/3ML) 0.083% nebulizer solution Take 3 mLs (2.5 mg total) by nebulization 3 (three) times daily as needed for shortness of breath or wheezing. 08/28/22   Shon Hale, MD  albuterol (VENTOLIN HFA) 108 (90 Base) MCG/ACT inhaler Inhale 2 puffs into the lungs every 4 (four) hours as needed for shortness of breath. 08/28/22   Shon Hale, MD  Aspirin-Acetaminophen-Caffeine (GOODYS EXTRA STRENGTH PO) Take 1 packet by mouth daily as needed (headaches).    [provider]  bisoprolol (ZEBETA) 5 MG tablet Take 0.5 tablets (2.5 mg total) by mouth daily. Patient taking differently: Take 2.5 mg  by mouth daily as needed (high bp). 10/13/22   Mallipeddi, Vishnu P, MD  BREO ELLIPTA 200-25 MCG/ACT AEPB Inhale 1 puff into the lungs daily. 01/21/23   [provider]  Docusate Sodium (DSS) 100 MG CAPS Take 1 capsule by mouth daily. 10/14/22   [provider]  doxycycline (VIBRAMYCIN) 100 MG capsule Take 1 capsule (100 mg total) by mouth 2 (two) times daily. One po bid x 7 days 02/23/23   Bethann Berkshire, MD  DULoxetine (CYMBALTA) 60 MG capsule Take 1 capsule (60 mg total) by mouth 2 (two) times daily. 08/28/22   Emokpae, Courage, MD  ENSURE (ENSURE) Take 237 mLs by mouth 3 (three) times daily between meals.    [provider]  FeFum-FePoly-FA-B Cmp-C-Biot (INTEGRA PLUS) CAPS Take 1 capsule by mouth every morning. 08/28/22   Shon Hale, MD  furosemide (LASIX) 20 MG tablet Take 1 tablet (20 mg total) by mouth daily as needed (3 pound weight gain). 10/08/22   Catarina Hartshorn, MD  Ipratropium-Albuterol (COMBIVENT RESPIMAT) 20-100 MCG/ACT AERS respimat Inhale 1 puff into the lungs every 6 (six) hours as needed for wheezing or shortness of breath. 08/28/22   Shon Hale, MD  Multiple Vitamins-Minerals (MULTIVITAMIN WITH MINERALS) tablet Take 1 tablet by mouth daily. 08/28/22 08/28/23  Shon Hale, MD  oxyCODONE-acetaminophen (PERCOCET/ROXICET) 5-325 MG tablet Take 1 tablet by mouth every 8 (eight) hours as needed for moderate pain. 05/08/18   Shon Hale, MD  pantoprazole (PROTONIX) 40 MG tablet Take 1 tablet (  40 mg total) by mouth 2 (two) times daily. 08/28/22 08/28/23  Shon Hale, MD  predniSONE (DELTASONE) 10 MG tablet Take 2 tablets (20 mg total) by mouth daily. 02/23/23   Bethann Berkshire, MD  tamsulosin (FLOMAX) 0.4 MG CAPS capsule Take 1 capsule (0.4 mg total) by mouth daily after supper. 08/28/22   Shon Hale, MD  topiramate (TOPAMAX) 25 MG tablet Take 1 tablet (25 mg total) by mouth 2 (two) times daily. 08/28/22   Shon Hale, MD  traZODone (DESYREL) 50  MG tablet Take 1 tablet (50 mg total) by mouth at bedtime. 08/28/22   Emokpae, Courage, MD  TRELEGY ELLIPTA 100-62.5-25 MCG/ACT AEPB Inhale 1 puff into the lungs daily. 08/28/22   Shon Hale, MD  Vitamin D, Ergocalciferol, (DRISDOL) 1.25 MG (50000 UNIT) CAPS capsule Take 1 capsule (50,000 Units total) by mouth once a week. 08/28/22   Shon Hale, MD  HYDROcodone bit-homatropine (HYCODAN) 5-1.5 MG/5ML syrup Take 5 mLs by mouth every 6 (six) hours as needed for cough. 11/10/21   Si Gaul, MD      Allergies    Desyrel [trazodone]    Review of Systems   Review of Systems  Respiratory:  Positive for shortness of breath.     Physical Exam Updated Vital Signs BP 129/74   Pulse (!) 105   Temp 98.4 F (36.9 C) (Oral)   Resp (!) 26   Ht 5\' 6"  (1.676 m)   Wt 56.5 kg   SpO2 94%   BMI 20.10 kg/m  Physical Exam Constitutional:      General: She is in acute distress.  Cardiovascular:     Rate and Rhythm: Normal rate and regular rhythm.     Pulses: Normal pulses.     Heart sounds: Normal heart sounds.  Pulmonary:     Effort: No respiratory distress.     Breath sounds: Wheezing (Bilateral) present.     Comments: Able to speak in full sentences On 6 L nasal cannula Nonproductive cough in the room Musculoskeletal:     Right lower leg: No tenderness. No edema.     Left lower leg: No tenderness. No edema.  Skin:    General: Skin is warm and dry.     Capillary Refill: Capillary refill takes less than 2 seconds.  Neurological:     Mental Status: She is alert and oriented to person, place, and time.  Psychiatric:        Mood and Affect: Mood normal.     ED Results / Procedures / Treatments   Labs (all labs ordered are listed, but only abnormal results are displayed) Labs Reviewed  COMPREHENSIVE METABOLIC PANEL - Abnormal; Notable for the following components:      Result Value   CO2 34 (*)    Calcium 8.8 (*)    Albumin 3.3 (*)    Alkaline Phosphatase 185 (*)     All other components within normal limits  CBC WITH DIFFERENTIAL/PLATELET - Abnormal; Notable for the following components:   RBC 3.07 (*)    Hemoglobin 9.0 (*)    HCT 28.1 (*)    RDW 16.0 (*)    Lymphs Abs 0.4 (*)    All other components within normal limits  RESP PANEL BY RT-PCR (RSV, FLU A&B, COVID)  RVPGX2  BRAIN NATRIURETIC PEPTIDE  LIPASE, BLOOD  URINALYSIS, ROUTINE W REFLEX MICROSCOPIC  TYPE AND SCREEN  TROPONIN I (HIGH SENSITIVITY)  TROPONIN I (HIGH SENSITIVITY)    EKG None  Radiology CT Angio Chest  PE W/Cm &/Or Wo Cm Result Date: 05/28/2023 CLINICAL DATA:  History of non-small-cell lung cancer with shortness of breath and abdominal pain EXAM: CT ANGIOGRAPHY CHEST CT ABDOMEN AND PELVIS WITH CONTRAST TECHNIQUE: Multidetector CT imaging of the chest was performed using the standard protocol during bolus administration of intravenous contrast. Multiplanar CT image reconstructions and MIPs were obtained to evaluate the vascular anatomy. Multidetector CT imaging of the abdomen and pelvis was performed using the standard protocol during bolus administration of intravenous contrast. RADIATION DOSE REDUCTION: This exam was performed according to the departmental dose-optimization program which includes automated exposure control, adjustment of the mA and/or kV according to patient size and/or use of iterative reconstruction technique. CONTRAST:  OMNIPAQUE IOHEXOL 350 MG/ML SOLN COMPARISON:  CT chest, abdomen, and pelvis dated 03/31/2023 FINDINGS: CTA CHEST FINDINGS Cardiovascular: Right chest wall port terminates in the right atrium. The study is high quality for the evaluation of pulmonary embolism. There are no filling defects in the central, lobar, segmental or subsegmental pulmonary artery branches to suggest acute pulmonary embolism. Great vessels are normal in course and caliber. Normal heart size. No significant pericardial fluid/thickening. Coronary artery calcifications.  Mediastinum/Nodes: Imaged thyroid gland without nodules meeting criteria for imaging follow-up by size. Normal esophagus. No pathologically enlarged axillary, supraclavicular, mediastinal, or hilar lymph nodes. Lungs/Pleura: The central airways are patent. Increased peribronchial wall thickening, most notable in the right lower lobe with scattered areas of subsegmental mucous plugging in the bilateral lower lobes. New irregular bilateral lower lobe consolidations on a background of bilateral lower lobe scarring/atelectasis. Unchanged severe centrilobular and paraseptal emphysema. Similar appearance of right perihilar and infrahilar postradiation changes. Decreased size of irregular right apical 1.6 x 1.4 cm nodule (5:27), previously 2.0 x 1.2 cm. No pneumothorax. Small right pleural effusion. Musculoskeletal: No acute or abnormal lytic or blastic osseous lesions. Multilevel degenerative changes of the thoracic spine. Review of the MIP images confirms the above findings. CT ABDOMEN and PELVIS FINDINGS Somewhat suboptimal timing of contrast bolus. Hepatobiliary: No focal hepatic lesions. Mild intra and extrahepatic bile duct dilation, likely secondary to cholecystectomy. Pancreas: No focal lesions or main ductal dilation. Spleen: Normal in size without focal abnormality. Adrenals/Urinary Tract: No adrenal nodules. No suspicious renal mass, calculi, or hydronephrosis. No focal bladder wall thickening. Stomach/Bowel: Normal appearance of the stomach. No evidence of bowel wall thickening, distention, or inflammatory changes. Colonic diverticulosis without acute diverticulitis. Appendix is not discretely seen. Vascular/Lymphatic: Aortic atherosclerosis. No enlarged abdominal or pelvic lymph nodes. Reproductive: Mildly lobulated uterus with multifocal coarse calcifications, likely leiomyomata. No definite adnexal masses. Other: No free fluid, fluid collection, or free air. Musculoskeletal: No acute or abnormal lytic or  blastic osseous lesions. Multilevel degenerative changes of the lumbar spine. IMPRESSION: 1. No evidence of pulmonary embolism. 2. Increased peribronchial wall thickening, most notable in the right lower lobe with scattered areas of subsegmental mucous plugging in the bilateral lower lobes, likely bronchitis. 3. New irregular bilateral lower lobe consolidations on a background of bilateral lower lobe scarring/atelectasis, suspicious for pneumonia. 4. Small right pleural effusion. 5. Slightly decreased size of irregular right apical nodule. 6. No acute findings in the abdomen or pelvis. 7. Colonic diverticulosis without acute diverticulitis. 8. Aortic Atherosclerosis (ICD10-I70.0) and Emphysema (ICD10-J43.9). Electronically Signed   By: Agustin Cree M.D.   On: 05/28/2023 12:05   CT ABDOMEN PELVIS W CONTRAST Result Date: 05/28/2023 CLINICAL DATA:  History of non-small-cell lung cancer with shortness of breath and abdominal pain EXAM: CT ANGIOGRAPHY CHEST CT ABDOMEN  AND PELVIS WITH CONTRAST TECHNIQUE: Multidetector CT imaging of the chest was performed using the standard protocol during bolus administration of intravenous contrast. Multiplanar CT image reconstructions and MIPs were obtained to evaluate the vascular anatomy. Multidetector CT imaging of the abdomen and pelvis was performed using the standard protocol during bolus administration of intravenous contrast. RADIATION DOSE REDUCTION: This exam was performed according to the departmental dose-optimization program which includes automated exposure control, adjustment of the mA and/or kV according to patient size and/or use of iterative reconstruction technique. CONTRAST:  OMNIPAQUE IOHEXOL 350 MG/ML SOLN COMPARISON:  CT chest, abdomen, and pelvis dated 03/31/2023 FINDINGS: CTA CHEST FINDINGS Cardiovascular: Right chest wall port terminates in the right atrium. The study is high quality for the evaluation of pulmonary embolism. There are no filling defects  in the central, lobar, segmental or subsegmental pulmonary artery branches to suggest acute pulmonary embolism. Great vessels are normal in course and caliber. Normal heart size. No significant pericardial fluid/thickening. Coronary artery calcifications. Mediastinum/Nodes: Imaged thyroid gland without nodules meeting criteria for imaging follow-up by size. Normal esophagus. No pathologically enlarged axillary, supraclavicular, mediastinal, or hilar lymph nodes. Lungs/Pleura: The central airways are patent. Increased peribronchial wall thickening, most notable in the right lower lobe with scattered areas of subsegmental mucous plugging in the bilateral lower lobes. New irregular bilateral lower lobe consolidations on a background of bilateral lower lobe scarring/atelectasis. Unchanged severe centrilobular and paraseptal emphysema. Similar appearance of right perihilar and infrahilar postradiation changes. Decreased size of irregular right apical 1.6 x 1.4 cm nodule (5:27), previously 2.0 x 1.2 cm. No pneumothorax. Small right pleural effusion. Musculoskeletal: No acute or abnormal lytic or blastic osseous lesions. Multilevel degenerative changes of the thoracic spine. Review of the MIP images confirms the above findings. CT ABDOMEN and PELVIS FINDINGS Somewhat suboptimal timing of contrast bolus. Hepatobiliary: No focal hepatic lesions. Mild intra and extrahepatic bile duct dilation, likely secondary to cholecystectomy. Pancreas: No focal lesions or main ductal dilation. Spleen: Normal in size without focal abnormality. Adrenals/Urinary Tract: No adrenal nodules. No suspicious renal mass, calculi, or hydronephrosis. No focal bladder wall thickening. Stomach/Bowel: Normal appearance of the stomach. No evidence of bowel wall thickening, distention, or inflammatory changes. Colonic diverticulosis without acute diverticulitis. Appendix is not discretely seen. Vascular/Lymphatic: Aortic atherosclerosis. No enlarged  abdominal or pelvic lymph nodes. Reproductive: Mildly lobulated uterus with multifocal coarse calcifications, likely leiomyomata. No definite adnexal masses. Other: No free fluid, fluid collection, or free air. Musculoskeletal: No acute or abnormal lytic or blastic osseous lesions. Multilevel degenerative changes of the lumbar spine. IMPRESSION: 1. No evidence of pulmonary embolism. 2. Increased peribronchial wall thickening, most notable in the right lower lobe with scattered areas of subsegmental mucous plugging in the bilateral lower lobes, likely bronchitis. 3. New irregular bilateral lower lobe consolidations on a background of bilateral lower lobe scarring/atelectasis, suspicious for pneumonia. 4. Small right pleural effusion. 5. Slightly decreased size of irregular right apical nodule. 6. No acute findings in the abdomen or pelvis. 7. Colonic diverticulosis without acute diverticulitis. 8. Aortic Atherosclerosis (ICD10-I70.0) and Emphysema (ICD10-J43.9). Electronically Signed   By: Agustin Cree M.D.   On: 05/28/2023 12:05   DG Chest 2 View Result Date: 05/28/2023 CLINICAL DATA:  Shortness of breath. EXAM: CHEST - 2 VIEW COMPARISON:  Chest radiograph dated 02/23/2023. CT chest dated 03/31/2023. FINDINGS: Stable cardiomediastinal silhouette. Aortic atherosclerosis. Stable right chest Port-A-Cath tip overlies the superior cavoatrial junction. Similar radiation changes are again noted in the right perihilar region. The spiculated nodule  in the right upper lobe is not well visualized on this exam. Small right pleural effusion with basilar atelectasis. Streaky opacities at the right lung base. Hyperinflation with similar diffuse coarsened interstitial markings. No pneumothorax. No acute osseous abnormality. IMPRESSION: 1. Small right pleural effusion with basilar atelectasis. Streaky opacities at the right lung base could reflect atelectasis/scarring, however, infiltrate can not be excluded. 2. Emphysema.  Electronically Signed   By: Hart Robinsons M.D.   On: 05/28/2023 10:37    Procedures .Critical Care  Performed by: Netta Corrigan, PA-C Authorized by: Netta Corrigan, PA-C   Critical care provider statement:    Critical care time (minutes):  50   Critical care time was exclusive of:  Separately billable procedures and treating other patients   Critical care was necessary to treat or prevent imminent or life-threatening deterioration of the following conditions:  Respiratory failure   Critical care was time spent personally by me on the following activities:  Blood draw for specimens, development of treatment plan with patient or surrogate, discussions with consultants, evaluation of patient's response to treatment, examination of patient, obtaining history from patient or surrogate, review of old charts, re-evaluation of patient's condition, pulse oximetry, ordering and review of radiographic studies, ordering and review of laboratory studies and ordering and performing treatments and interventions   I assumed direction of critical care for this patient from another provider in my specialty: no     Care discussed with: admitting provider       Medications Ordered in ED Medications  cefTRIAXone (ROCEPHIN) 1 g in sodium chloride 0.9 % 100 mL IVPB (1 g Intravenous New Bag/Given 05/28/23 1231)  azithromycin (ZITHROMAX) 500 mg in sodium chloride 0.9 % 250 mL IVPB (has no administration in time range)  methylPREDNISolone sodium succinate (SOLU-MEDROL) 125 mg/2 mL injection 125 mg (125 mg Intravenous Given 05/28/23 0926)  ipratropium-albuterol (DUONEB) 0.5-2.5 (3) MG/3ML nebulizer solution 9 mL (9 mLs Nebulization Given 05/28/23 0940)  iohexol (OMNIPAQUE) 350 MG/ML injection 100 mL (100 mLs Intravenous Contrast Given 05/28/23 1017)  pantoprazole (PROTONIX) injection 40 mg (40 mg Intravenous Given 05/28/23 1125)    ED Course/ Medical Decision Making/ A&P                                  Medical Decision Making Amount and/or Complexity of Data Reviewed Labs: ordered. Radiology: ordered.  Risk Prescription drug management. Decision regarding hospitalization.   Courtney Zuniga 73 y.o. presented today for shortness of breath.  Working DDx that I considered at this time includes, but not limited to, asthma/COPD exacerbation, URI, viral illness, anemia, ACS, PE, pneumonia, pleural effusion, lung cancer, CHF, respiratory distress, medication side effect, intoxication.  R/o DDx: Asthma exacerbation, URI, viral illness, anemia, ACS, PE, lung cancer, CHF, respiratory distress, medication side effect, intoxication: These are considered less likely due to history of present illness, physical exam, labs/imaging findings  Review of prior external notes: 116 2025 infusion  Unique Tests and My Independent Interpretation:  CBC: Hemoglobin 9 which is down from 12 from 3 months ago CMP: Hypercapnia 34 however this is chronic most likely consistent with her COPD BNP: Unremarkable Lipase: Unremarkable UA: EKG: Sinus 99 bpm, artifact was present however I do not see any signs of ST elevation or depression or right-sided heart strain Troponin: 4, 3 CXR: Small right pleural effusion with questionable infiltrates CTA Chest PE: Opacity suspicious for pneumonia, small bilateral pleural effusions CT  abdomen pelvis with contrast: diverticulosis without diverticulitis Respiratory Panel: Negative  Social Determinants of Health: none  Discussion with Independent Historian:  EMS  Discussion of Management of Tests:  Ahmed, MD Levy Pupa, MD hospitalist  Risk: High: hospitalization or escalation of hospital-level care  Risk Stratification Score: none  Staffed with Kommor, MD  Plan: On exam patient was acute distress however was speaking full sentences however was hypoxic 83% on 4 L which is her baseline and so she was placed on her a nonrebreather. Physical exam showed wheezing  bilaterally.  Patient is on 6 L which is up from her baseline of 4 L and was reportedly hypoxic with EMS and so we will order breathing treatment along with steroids. The cardiac monitor was ordered secondary to the patient's history of shortness of breath and to monitor the patient for dysrhythmia. Cardiac monitor by my independent interpretation showed normal sinus.  Will order labs and imaging.  Patient did endorse abdominal pain last week and although she was nontender on exam today we will obtain imaging as we are already going to be imaging her chest to evaluate for PE.  Due to hypoxia patient will need admission.  Do feel this could be a COPD exacerbation given the wheezing and patient's history.  Will avoid fluids at this time as patient does have aortic regurg with aortic stenosis to avoid flash pulmonary edema.  Labs do show a drop in hemoglobin from 12-9 in the past 3 months.  I spoke to the patient patient states she has had darker stools for the past few months which could explain this drop in hemoglobin.  Patient is currently on chemo so we will defer rectal exam will consult GI as patient most likely needs a colonoscopy to rule out GI bleed.  In the meantime we will give patient Protonix.  During my recheck I reevaluated cardiac monitor my independent rotation patient's of sinus tachycardia.  Spoke to GI and they state that patient does have history of gastric ulcers and suspects may be bleeding patient's having black stools and to have the patient n.p.o. and to continue the Protonix and that they will see the patient once patient is admitted.  Imaging came back showing signs of pneumonia and so we will start patient on antibiotics.  Do feel the pneumonia exacerbated her COPD.  Will admit to hospitalist.  Patient stable to be admitted.  I spoke to the hospitalist and patient was accepted for admission.  Patient stable to be admitted.  This chart was dictated using voice recognition software.   Despite best efforts to proofread,  errors can occur which can change the documentation meaning.         Final Clinical Impression(s) / ED Diagnoses Final diagnoses:  Acute on chronic respiratory failure with hypoxia (HCC)  Anemia, unspecified type  Pneumonia due to infectious organism, unspecified laterality, unspecified part of lung  Pleural effusion  Upper GI bleed    Rx / DC Orders ED Discharge Orders     None         Remi Deter 05/28/23 1243    Glendora Score, MD 05/28/23 1818

## 2023-05-28 NOTE — Progress Notes (Signed)
Helped transport patient from ED 9 to ICU 3 without incident.  Report called earlier to [place], RRT, and made aware we would be transporting.

## 2023-05-28 NOTE — Progress Notes (Signed)
Patient placed on BIPAP due to increased work of breathing and increased oxygen demand.  Patient tolerating well at this time.      05/28/23 1429  BiPAP/CPAP/SIPAP  $ Non-Invasive Ventilator  Non-Invasive Vent Set Up;Non-Invasive Vent Initial  $ Face Mask Small Yes  BiPAP/CPAP/SIPAP Pt Type Adult  BiPAP/CPAP/SIPAP V60  Mask Type Full face mask  Dentures removed? Not applicable  Mask Size Small  Set Rate 16 breaths/min  Respiratory Rate 36 breaths/min  IPAP 14 cmH20  EPAP 7 cmH2O  FiO2 (%) 50 %  Minute Ventilation 13.4  Leak 0  Peak Inspiratory Pressure (PIP) 15  Tidal Volume (Vt) 371  Patient Home Equipment No  Auto Titrate No  Press High Alarm 30 cmH2O  Press Low Alarm 5 cmH2O  BiPAP/CPAP /SiPAP Vitals  Pulse Rate (!) 102  SpO2 94 %  MEWS Score/Color  MEWS Score 3  MEWS Score Color Yellow

## 2023-05-28 NOTE — Consult Note (Signed)
Gastroenterology Consult   Referring Provider: No ref. provider found Primary Care Physician:  Oneal Grout, FNP Primary Gastroenterologist:  Hennie Duos. Marletta Lor, DO Patient ID: Courtney Zuniga; [id number]; 19-Dec-1950   Admit date: 05/28/2023  LOS: 0 days   Date of Consultation: 05/28/2023  Reason for Consultation:  melena    History of Present Illness   Courtney Zuniga is a 73 y.o. 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 Reportedly complaining of lower abdominal pain last week which has resolved.  In the ED: When EMS arrived they found her oxygen saturations to be 87% on 4 L, she was bumped up to 6 L and satting at 91%.  BUN 11, creatinine 0.53, alkaline phosphatase 185, troponin 4/3, hemoglobin 9 down from 12 back in November, MCV 91.5, platelets 270,000, white blood cell count 8500, BNP 100, respiratory panel negative.  Chest x-ray with small right pleural effusion with basilar atelectasis.  Streaky opacities at the right lung base could reflect atelectasis/scarring however infiltrate not excluded.  Emphysema.  CT angio chest with and without contrast/CT abdomen and pelvis: No PE.  Likely bronchitis, suspicious for bilateral lower lobe pneumonia, small right pleural effusion, slightly decreased size of irregular right apical nodule, mildly lobulated uterus with  multifocal coarse calcifications, likely leiomyomata   GI consult:  Upon initially evaluation, patient resting comfortably, with BIPAP in place. Exam completed without patient awakening. Upon additional physical stimulation, patient alert, somewhat agitated, fighting BIPAP. Calmed patient, elected not to try to obtain further history at this time to allow her to rest and not fight BIPAP.  According to the chart, EDP states she had noticed darker stools for few months. DRE not completed.    EGD February 2024: -Single submucosal papule (nodule) found in the stomach -Nonbleeding gastric ulcer with clean ulcer base (Forrest class III) -Blood in the first portion of the duodenum -Single bleeding angiodysplastic lesion in the duodenum.  Treated with APC -Multiple nonbleeding angiodysplastic lesions in the duodenum.  Treated with APC  Colonoscopy 2010: -Poor bowel prep, small polyp could be missed  Prior to Admission medications   Medication Sig Start Date End Date Taking? Authorizing Provider  acetaminophen (TYLENOL) 325 MG tablet Take 2 tablets (650 mg total) by mouth every 6 (six) hours as needed for mild pain, fever or headache. 05/08/18   Mariea Clonts, Courage, MD  albuterol (PROVENTIL) (2.5 MG/3ML) 0.083% nebulizer solution Take 3 mLs (2.5 mg total) by nebulization 3 (three) times daily as needed for shortness of breath or wheezing. 08/28/22   Shon Hale, MD  albuterol (VENTOLIN HFA) 108 (90 Base) MCG/ACT inhaler Inhale 2 puffs into the lungs every 4 (four) hours as needed for shortness of breath. 08/28/22   Shon Hale, MD  Aspirin-Acetaminophen-Caffeine (GOODYS EXTRA STRENGTH PO) Take 1 packet by mouth daily as needed (headaches).    [provider]  bisoprolol (ZEBETA) 5 MG tablet Take 0.5 tablets (2.5 mg total) by mouth daily. Patient taking differently:  Take 2.5 mg by mouth daily as needed (high bp). 10/13/22   Mallipeddi, Vishnu P, MD  BREO ELLIPTA 200-25 MCG/ACT AEPB Inhale  1 puff into the lungs daily. 01/21/23   [provider]  Docusate Sodium (DSS) 100 MG CAPS Take 1 capsule by mouth daily. 10/14/22   [provider]  doxycycline (VIBRAMYCIN) 100 MG capsule Take 1 capsule (100 mg total) by mouth 2 (two) times daily. One po bid x 7 days 02/23/23   Bethann Berkshire, MD  DULoxetine (CYMBALTA) 60 MG capsule Take 1 capsule (60 mg total) by mouth 2 (two) times daily. 08/28/22   Emokpae, Courage, MD  ENSURE (ENSURE) Take 237 mLs by mouth 3 (three) times daily between meals.    [provider]  FeFum-FePoly-FA-B Cmp-C-Biot (INTEGRA PLUS) CAPS Take 1 capsule by mouth every morning. 08/28/22   Shon Hale, MD  furosemide (LASIX) 20 MG tablet Take 1 tablet (20 mg total) by mouth daily as needed (3 pound weight gain). 10/08/22   Catarina Hartshorn, MD  Ipratropium-Albuterol (COMBIVENT RESPIMAT) 20-100 MCG/ACT AERS respimat Inhale 1 puff into the lungs every 6 (six) hours as needed for wheezing or shortness of breath. 08/28/22   Shon Hale, MD  Multiple Vitamins-Minerals (MULTIVITAMIN WITH MINERALS) tablet Take 1 tablet by mouth daily. 08/28/22 08/28/23  Shon Hale, MD  oxyCODONE-acetaminophen (PERCOCET/ROXICET) 5-325 MG tablet Take 1 tablet by mouth every 8 (eight) hours as needed for moderate pain. 05/08/18   Shon Hale, MD  pantoprazole (PROTONIX) 40 MG tablet Take 1 tablet (40 mg total) by mouth 2 (two) times daily. 08/28/22 08/28/23  Shon Hale, MD  predniSONE (DELTASONE) 10 MG tablet Take 2 tablets (20 mg total) by mouth daily. 02/23/23   Bethann Berkshire, MD  tamsulosin (FLOMAX) 0.4 MG CAPS capsule Take 1 capsule (0.4 mg total) by mouth daily after supper. 08/28/22   Shon Hale, MD  topiramate (TOPAMAX) 25 MG tablet Take 1 tablet (25 mg total) by mouth 2 (two) times daily. 08/28/22   Shon Hale, MD  traZODone (DESYREL) 50 MG tablet Take 1 tablet (50 mg total) by mouth at bedtime. 08/28/22   Emokpae, Courage, MD  TRELEGY ELLIPTA  100-62.5-25 MCG/ACT AEPB Inhale 1 puff into the lungs daily. 08/28/22   Shon Hale, MD  Vitamin D, Ergocalciferol, (DRISDOL) 1.25 MG (50000 UNIT) CAPS capsule Take 1 capsule (50,000 Units total) by mouth once a week. 08/28/22   Shon Hale, MD  HYDROcodone bit-homatropine (HYCODAN) 5-1.5 MG/5ML syrup Take 5 mLs by mouth every 6 (six) hours as needed for cough. 11/10/21   Si Gaul, MD    Current Facility-Administered Medications  Medication Dose Route Frequency Provider Last Rate Last Admin   acetaminophen (TYLENOL) tablet 650 mg  650 mg Oral Q6H PRN Johnson, Clanford L, MD       Or   acetaminophen (TYLENOL) suppository 650 mg  650 mg Rectal Q6H PRN Laural Benes, Clanford L, MD       [START ON 05/29/2023] azithromycin (ZITHROMAX) 500 mg in sodium chloride 0.9 % 250 mL IVPB  500 mg Intravenous Q24H Johnson, Clanford L, MD       cefTRIAXone (ROCEPHIN) 2 g in sodium chloride 0.9 % 100 mL IVPB  2 g Intravenous Q24H Johnson, Clanford L, MD       [START ON 05/29/2023] Chlorhexidine Gluconate Cloth 2 % PADS 6 each  6 each Topical Q0600 Johnson, Clanford L, MD       fentaNYL (SUBLIMAZE) injection 12.5 mcg  12.5 mcg Intravenous Q2H PRN Laural Benes,  Clanford L, MD       ipratropium-albuterol (DUONEB) 0.5-2.5 (3) MG/3ML nebulizer solution 3 mL  3 mL Nebulization Q6H Johnson, Clanford L, MD   3 mL at 05/28/23 1414   [START ON 05/29/2023] methylPREDNISolone sodium succinate (SOLU-MEDROL) 125 mg/2 mL injection 60 mg  60 mg Intravenous Q12H Johnson, Clanford L, MD       ondansetron (ZOFRAN) tablet 4 mg  4 mg Oral Q6H PRN Johnson, Clanford L, MD       Or   ondansetron (ZOFRAN) injection 4 mg  4 mg Intravenous Q6H PRN Johnson, Clanford L, MD       oxyCODONE (Oxy IR/ROXICODONE) immediate release tablet 5 mg  5 mg Oral Q4H PRN Johnson, Clanford L, MD       pantoprazole (PROTONIX) injection 40 mg  40 mg Intravenous Q12H Johnson, Clanford L, MD       senna-docusate (Senokot-S) tablet 1 tablet  1 tablet Oral QHS  PRN Laural Benes, Clanford L, MD       Current Outpatient Medications  Medication Sig Dispense Refill   acetaminophen (TYLENOL) 325 MG tablet Take 2 tablets (650 mg total) by mouth every 6 (six) hours as needed for mild pain, fever or headache. 12 tablet 2   albuterol (PROVENTIL) (2.5 MG/3ML) 0.083% nebulizer solution Take 3 mLs (2.5 mg total) by nebulization 3 (three) times daily as needed for shortness of breath or wheezing. 75 mL 12   albuterol (VENTOLIN HFA) 108 (90 Base) MCG/ACT inhaler Inhale 2 puffs into the lungs every 4 (four) hours as needed for shortness of breath. 18 g 3   Aspirin-Acetaminophen-Caffeine (GOODYS EXTRA STRENGTH PO) Take 1 packet by mouth daily as needed (headaches).     bisoprolol (ZEBETA) 5 MG tablet Take 0.5 tablets (2.5 mg total) by mouth daily. (Patient taking differently: Take 2.5 mg by mouth daily as needed (high bp).) 45 tablet 1   BREO ELLIPTA 200-25 MCG/ACT AEPB Inhale 1 puff into the lungs daily.     Docusate Sodium (DSS) 100 MG CAPS Take 1 capsule by mouth daily.     doxycycline (VIBRAMYCIN) 100 MG capsule Take 1 capsule (100 mg total) by mouth 2 (two) times daily. One po bid x 7 days 14 capsule 0   DULoxetine (CYMBALTA) 60 MG capsule Take 1 capsule (60 mg total) by mouth 2 (two) times daily. 180 capsule 3   ENSURE (ENSURE) Take 237 mLs by mouth 3 (three) times daily between meals.     FeFum-FePoly-FA-B Cmp-C-Biot (INTEGRA PLUS) CAPS Take 1 capsule by mouth every morning. 30 capsule 5   furosemide (LASIX) 20 MG tablet Take 1 tablet (20 mg total) by mouth daily as needed (3 pound weight gain). 30 tablet 0   Ipratropium-Albuterol (COMBIVENT RESPIMAT) 20-100 MCG/ACT AERS respimat Inhale 1 puff into the lungs every 6 (six) hours as needed for wheezing or shortness of breath. 4 g 3   Multiple Vitamins-Minerals (MULTIVITAMIN WITH MINERALS) tablet Take 1 tablet by mouth daily. 120 tablet 2   oxyCODONE-acetaminophen (PERCOCET/ROXICET) 5-325 MG tablet Take 1 tablet by mouth  every 8 (eight) hours as needed for moderate pain. 12 tablet 0   pantoprazole (PROTONIX) 40 MG tablet Take 1 tablet (40 mg total) by mouth 2 (two) times daily. 60 tablet 11   predniSONE (DELTASONE) 10 MG tablet Take 2 tablets (20 mg total) by mouth daily. 14 tablet 0   tamsulosin (FLOMAX) 0.4 MG CAPS capsule Take 1 capsule (0.4 mg total) by mouth daily after supper. 30 capsule 3  topiramate (TOPAMAX) 25 MG tablet Take 1 tablet (25 mg total) by mouth 2 (two) times daily. 60 tablet 2   traZODone (DESYREL) 50 MG tablet Take 1 tablet (50 mg total) by mouth at bedtime. 30 tablet 5   TRELEGY ELLIPTA 100-62.5-25 MCG/ACT AEPB Inhale 1 puff into the lungs daily. 60 each 3   Vitamin D, Ergocalciferol, (DRISDOL) 1.25 MG (50000 UNIT) CAPS capsule Take 1 capsule (50,000 Units total) by mouth once a week. 5 capsule 3    Allergies as of 05/28/2023 - Review Complete 05/28/2023  Allergen Reaction Noted   Desyrel [trazodone] Other (See Comments) 07/17/2021    Past Medical History:  Diagnosis Date   Anemia    as a young woman   Arthritis    osteoartritis   Asthma    Brain tumor (benign) (HCC) 2005 [facility]   Benign   Chronic headaches    Chronic hip pain    Chronic pain    COPD (chronic obstructive pulmonary disease) (HCC)    Coronary artery disease    Depression    Depression 05/15/2016   Encounter for antineoplastic chemotherapy 01/10/2016   GERD (gastroesophageal reflux disease)    Hypertension    Lung cancer (HCC) dx'd 01/2016   currently on chemo and radiation    NSTEMI (non-ST elevated myocardial infarction) (HCC) yrs ago   On home O2    qhs 2 liters at hs and prn   Pneumonia last time 2 yrs ago   Shortness of breath dyspnea    with activity    Past Surgical History:  Procedure Laterality Date   CHOLECYSTECTOMY     COLONOSCOPY  2015   Results requested from [facility]   COLONOSCOPY     ESOPHAGOGASTRODUODENOSCOPY N/A 08/14/2015   Procedure: ESOPHAGOGASTRODUODENOSCOPY (EGD);   Surgeon: Corbin Ade, MD;  Location: [facility];  Service: Endoscopy;  Laterality: N/A;  215    ESOPHAGOGASTRODUODENOSCOPY (EGD) WITH PROPOFOL N/A 09/13/2015   Procedure: ESOPHAGOGASTRODUODENOSCOPY (EGD) WITH PROPOFOL;  Surgeon: Rachael Fee, MD;  Location: [facility];  Service: Endoscopy;  Laterality: N/A;   ESOPHAGOGASTRODUODENOSCOPY (EGD) WITH PROPOFOL N/A 05/19/2022   Procedure: ESOPHAGOGASTRODUODENOSCOPY (EGD) WITH PROPOFOL;  Surgeon: Lanelle Bal, DO;  Location: [facility];  Service: Endoscopy;  Laterality: N/A;   EUS N/A 03/12/2017   Procedure: UPPER ENDOSCOPIC ULTRASOUND (EUS) RADIAL;  Surgeon: Rachael Fee, MD;  Location: [facility];  Service: Endoscopy;  Laterality: N/A;   HOT HEMOSTASIS  05/19/2022   Procedure: HOT HEMOSTASIS (ARGON PLASMA COAGULATION/BICAP);  Surgeon: Lanelle Bal, DO;  Location: [facility];  Service: Endoscopy;;   IR IMAGING GUIDED PORT INSERTION  01/20/2022   TUMOR REMOVAL  2005   Benign   UPPER ESOPHAGEAL ENDOSCOPIC ULTRASOUND (EUS)  09/13/2015   Procedure: UPPER ESOPHAGEAL ENDOSCOPIC ULTRASOUND (EUS);  Surgeon: Rachael Fee, MD;  Location: [facility];  Service: Endoscopy;;   VIDEO BRONCHOSCOPY WITH ENDOBRONCHIAL NAVIGATION N/A 12/31/2015   Procedure: VIDEO BRONCHOSCOPY WITH ENDOBRONCHIAL NAVIGATION;  Surgeon: Loreli Slot, MD;  Location: [facility] OR;  Service: Thoracic;  Laterality: N/A;   VIDEO BRONCHOSCOPY WITH ENDOBRONCHIAL ULTRASOUND N/A 11/08/2015   Procedure: VIDEO BRONCHOSCOPY WITH ENDOBRONCHIAL ULTRASOUND;  Surgeon: Kerin Perna, MD;  Location: [facility] OR;  Service: Thoracic;  Laterality: N/A;    Family History  Problem Relation Age of Onset   Ovarian cancer Mother    Lung cancer Father    Brain cancer Sister        Half sister  Lung cancer Brother    Lung cancer Brother    Prostate cancer Brother    Colon cancer Neg Hx    Colon polyps Neg Hx     Social History   Socioeconomic History   Marital status: Single     Spouse name: Not on file   Number of children: Not on file   Years of education: 10th grade   Highest education level: Not on file  Occupational History   Occupation: retired  Tobacco Use   Smoking status: Former    Current packs/day: 0.00    Average packs/day: 1 pack/day for 38.0 years (38.0 ttl pk-yrs)    Types: Cigarettes    Start date: 07/25/1980    Quit date: 07/26/2018    Years since quitting: 4.8   Smokeless tobacco: Never   Tobacco comments:    Pt has asked doctor for med to help   Vaping Use   Vaping status: Never Used  Substance and Sexual Activity   Alcohol use: No    Alcohol/week: 0.0 standard drinks of alcohol   Drug use: No   Sexual activity: Not Currently  Other Topics Concern   Not on file  Social History Narrative   Not on file   Social Drivers of Health   Financial Resource Strain: Low Risk  (05/05/2018)   Overall Financial Resource Strain (CARDIA)    Difficulty of Paying Living Expenses: Not very hard  Food Insecurity: Patient Unable To Answer (05/28/2023)   Hunger Vital Sign    Worried About Running Out of Food in the Last Year: Patient unable to answer    Ran Out of Food in the Last Year: Patient unable to answer  Transportation Needs: Patient Unable To Answer (05/28/2023)   PRAPARE - Transportation    Lack of Transportation (Medical): Patient unable to answer    Lack of Transportation (Non-Medical): Patient unable to answer  Physical Activity: Insufficiently Active (05/05/2018)   Exercise Vital Sign    Days of Exercise per Week: 5 days    Minutes of Exercise per Session: 10 min  Stress: No Stress Concern Present (05/05/2018)   [employer] of Occupational Health - Occupational Stress Questionnaire    Feeling of Stress : Not at all  Social Connections: Somewhat Isolated (05/05/2018)   Social Connection and Isolation Panel [NHANES]    Frequency of Communication with Friends and Family: More than three times a week    Frequency of Social  Gatherings with Friends and Family: More than three times a week    Attends Religious Services: More than 4 times per year    Active Member of [employer] or Organizations: No    Attends Banker Meetings: Never    Marital Status: Never married  Intimate Partner Violence: Patient Unable To Answer (05/28/2023)   Humiliation, Afraid, Rape, and Kick questionnaire    Fear of Current or Ex-Partner: Patient unable to answer    Emotionally Abused: Patient unable to answer    Physically Abused: Patient unable to answer    Sexually Abused: Patient unable to answer     Review of System:  Could not obtain for reasons mentioned in HPI.      Physical Examination:   Vital signs in last 24 hours: Temp:  [98.4 F (36.9 C)] 98.4 F (36.9 C) (02/13 0910) Pulse Rate:  [99-105] 103 (02/13 1300) Resp:  [22-26] 26 (02/13 0915) BP: (116-129)/(69-74) 117/71 (02/13 1300) SpO2:  [83 %-100 %] 100 % (02/13 1414) Weight:  [56.5 kg]  56.5 kg (02/13 0914)    General: acutely ill appearing thin female. BIPAP in place with 100% oxygenation. Patient became agitated and fought BIPAP some when I aroused her with physical stimuli. She did calm down and responded with yes head nods before going back to sleep. Head: Normocephalic, atraumatic.   Eyes: Conjunctiva pink, no icterus. Mouth:BIPAP in place Neck: Supple without thyromegaly, masses, or lymphadenopathy.  Lungs: expiratory wheezing Heart: Regular rate and rhythm, no murmurs rubs or gallops.  Abdomen: Bowel sounds are normal, nontender, nondistended, no hepatosplenomegaly or masses, no abdominal bruits or hernia , no rebound or guarding.   Rectal: not performed Extremities: No lower extremity edema, clubbing, deformity.  Neuro:   Agitated with awakening Skin: Warm and dry, no rash or jaundice.   Psych: Alert and cooperative, normal mood and affect.        Intake/Output from previous day: No intake/output data recorded. Intake/Output this shift: No  intake/output data recorded.  Lab Results:   CBC Recent Labs    05/28/23 0908  WBC 8.5  HGB 9.0*  HCT 28.1*  MCV 91.5  PLT 270   BMET Recent Labs    05/28/23 0908  NA 142  K 3.9  CL 99  CO2 34*  GLUCOSE 99  BUN 11  CREATININE 0.53  CALCIUM 8.8*   LFT Recent Labs    05/28/23 0908  BILITOT 0.2  ALKPHOS 185*  AST 18  ALT 12  PROT 6.8  ALBUMIN 3.3*    Lipase Recent Labs    05/28/23 0907  LIPASE 19    PT/INR No results for input(s): "LABPROT", "INR" in the last 72 hours.   Hepatitis Panel No results for input(s): "HEPBSAG", "HCVAB", "HEPAIGM", "HEPBIGM" in the last 72 hours.   Imaging Studies:   CT Angio Chest PE W/Cm &/Or Wo Cm Result Date: 05/28/2023 CLINICAL DATA:  History of non-small-cell lung cancer with shortness of breath and abdominal pain EXAM: CT ANGIOGRAPHY CHEST CT ABDOMEN AND PELVIS WITH CONTRAST TECHNIQUE: Multidetector CT imaging of the chest was performed using the standard protocol during bolus administration of intravenous contrast. Multiplanar CT image reconstructions and MIPs were obtained to evaluate the vascular anatomy. Multidetector CT imaging of the abdomen and pelvis was performed using the standard protocol during bolus administration of intravenous contrast. RADIATION DOSE REDUCTION: This exam was performed according to the departmental dose-optimization program which includes automated exposure control, adjustment of the mA and/or kV according to patient size and/or use of iterative reconstruction technique. CONTRAST:  OMNIPAQUE IOHEXOL 350 MG/ML SOLN COMPARISON:  CT chest, abdomen, and pelvis dated 03/31/2023 FINDINGS: CTA CHEST FINDINGS Cardiovascular: Right chest wall port terminates in the right atrium. The study is high quality for the evaluation of pulmonary embolism. There are no filling defects in the central, lobar, segmental or subsegmental pulmonary artery branches to suggest acute pulmonary embolism. Great vessels are  normal in course and caliber. Normal heart size. No significant pericardial fluid/thickening. Coronary artery calcifications. Mediastinum/Nodes: Imaged thyroid gland without nodules meeting criteria for imaging follow-up by size. Normal esophagus. No pathologically enlarged axillary, supraclavicular, mediastinal, or hilar lymph nodes. Lungs/Pleura: The central airways are patent. Increased peribronchial wall thickening, most notable in the right lower lobe with scattered areas of subsegmental mucous plugging in the bilateral lower lobes. New irregular bilateral lower lobe consolidations on a background of bilateral lower lobe scarring/atelectasis. Unchanged severe centrilobular and paraseptal emphysema. Similar appearance of right perihilar and infrahilar postradiation changes. Decreased size of irregular right apical 1.6 x  1.4 cm nodule (5:27), previously 2.0 x 1.2 cm. No pneumothorax. Small right pleural effusion. Musculoskeletal: No acute or abnormal lytic or blastic osseous lesions. Multilevel degenerative changes of the thoracic spine. Review of the MIP images confirms the above findings. CT ABDOMEN and PELVIS FINDINGS Somewhat suboptimal timing of contrast bolus. Hepatobiliary: No focal hepatic lesions. Mild intra and extrahepatic bile duct dilation, likely secondary to cholecystectomy. Pancreas: No focal lesions or main ductal dilation. Spleen: Normal in size without focal abnormality. Adrenals/Urinary Tract: No adrenal nodules. No suspicious renal mass, calculi, or hydronephrosis. No focal bladder wall thickening. Stomach/Bowel: Normal appearance of the stomach. No evidence of bowel wall thickening, distention, or inflammatory changes. Colonic diverticulosis without acute diverticulitis. Appendix is not discretely seen. Vascular/Lymphatic: Aortic atherosclerosis. No enlarged abdominal or pelvic lymph nodes. Reproductive: Mildly lobulated uterus with multifocal coarse calcifications, likely leiomyomata. No  definite adnexal masses. Other: No free fluid, fluid collection, or free air. Musculoskeletal: No acute or abnormal lytic or blastic osseous lesions. Multilevel degenerative changes of the lumbar spine. IMPRESSION: 1. No evidence of pulmonary embolism. 2. Increased peribronchial wall thickening, most notable in the right lower lobe with scattered areas of subsegmental mucous plugging in the bilateral lower lobes, likely bronchitis. 3. New irregular bilateral lower lobe consolidations on a background of bilateral lower lobe scarring/atelectasis, suspicious for pneumonia. 4. Small right pleural effusion. 5. Slightly decreased size of irregular right apical nodule. 6. No acute findings in the abdomen or pelvis. 7. Colonic diverticulosis without acute diverticulitis. 8. Aortic Atherosclerosis (ICD10-I70.0) and Emphysema (ICD10-J43.9). Electronically Signed   By: Agustin Cree M.D.   On: 05/28/2023 12:05   CT ABDOMEN PELVIS W CONTRAST Result Date: 05/28/2023 CLINICAL DATA:  History of non-small-cell lung cancer with shortness of breath and abdominal pain EXAM: CT ANGIOGRAPHY CHEST CT ABDOMEN AND PELVIS WITH CONTRAST TECHNIQUE: Multidetector CT imaging of the chest was performed using the standard protocol during bolus administration of intravenous contrast. Multiplanar CT image reconstructions and MIPs were obtained to evaluate the vascular anatomy. Multidetector CT imaging of the abdomen and pelvis was performed using the standard protocol during bolus administration of intravenous contrast. RADIATION DOSE REDUCTION: This exam was performed according to the departmental dose-optimization program which includes automated exposure control, adjustment of the mA and/or kV according to patient size and/or use of iterative reconstruction technique. CONTRAST:  OMNIPAQUE IOHEXOL 350 MG/ML SOLN COMPARISON:  CT chest, abdomen, and pelvis dated 03/31/2023 FINDINGS: CTA CHEST FINDINGS Cardiovascular: Right chest wall port  terminates in the right atrium. The study is high quality for the evaluation of pulmonary embolism. There are no filling defects in the central, lobar, segmental or subsegmental pulmonary artery branches to suggest acute pulmonary embolism. Great vessels are normal in course and caliber. Normal heart size. No significant pericardial fluid/thickening. Coronary artery calcifications. Mediastinum/Nodes: Imaged thyroid gland without nodules meeting criteria for imaging follow-up by size. Normal esophagus. No pathologically enlarged axillary, supraclavicular, mediastinal, or hilar lymph nodes. Lungs/Pleura: The central airways are patent. Increased peribronchial wall thickening, most notable in the right lower lobe with scattered areas of subsegmental mucous plugging in the bilateral lower lobes. New irregular bilateral lower lobe consolidations on a background of bilateral lower lobe scarring/atelectasis. Unchanged severe centrilobular and paraseptal emphysema. Similar appearance of right perihilar and infrahilar postradiation changes. Decreased size of irregular right apical 1.6 x 1.4 cm nodule (5:27), previously 2.0 x 1.2 cm. No pneumothorax. Small right pleural effusion. Musculoskeletal: No acute or abnormal lytic or blastic osseous lesions. Multilevel degenerative changes of  the thoracic spine. Review of the MIP images confirms the above findings. CT ABDOMEN and PELVIS FINDINGS Somewhat suboptimal timing of contrast bolus. Hepatobiliary: No focal hepatic lesions. Mild intra and extrahepatic bile duct dilation, likely secondary to cholecystectomy. Pancreas: No focal lesions or main ductal dilation. Spleen: Normal in size without focal abnormality. Adrenals/Urinary Tract: No adrenal nodules. No suspicious renal mass, calculi, or hydronephrosis. No focal bladder wall thickening. Stomach/Bowel: Normal appearance of the stomach. No evidence of bowel wall thickening, distention, or inflammatory changes. Colonic  diverticulosis without acute diverticulitis. Appendix is not discretely seen. Vascular/Lymphatic: Aortic atherosclerosis. No enlarged abdominal or pelvic lymph nodes. Reproductive: Mildly lobulated uterus with multifocal coarse calcifications, likely leiomyomata. No definite adnexal masses. Other: No free fluid, fluid collection, or free air. Musculoskeletal: No acute or abnormal lytic or blastic osseous lesions. Multilevel degenerative changes of the lumbar spine. IMPRESSION: 1. No evidence of pulmonary embolism. 2. Increased peribronchial wall thickening, most notable in the right lower lobe with scattered areas of subsegmental mucous plugging in the bilateral lower lobes, likely bronchitis. 3. New irregular bilateral lower lobe consolidations on a background of bilateral lower lobe scarring/atelectasis, suspicious for pneumonia. 4. Small right pleural effusion. 5. Slightly decreased size of irregular right apical nodule. 6. No acute findings in the abdomen or pelvis. 7. Colonic diverticulosis without acute diverticulitis. 8. Aortic Atherosclerosis (ICD10-I70.0) and Emphysema (ICD10-J43.9). Electronically Signed   By: Agustin Cree M.D.   On: 05/28/2023 12:05   DG Chest 2 View Result Date: 05/28/2023 CLINICAL DATA:  Shortness of breath. EXAM: CHEST - 2 VIEW COMPARISON:  Chest radiograph dated 02/23/2023. CT chest dated 03/31/2023. FINDINGS: Stable cardiomediastinal silhouette. Aortic atherosclerosis. Stable right chest Port-A-Cath tip overlies the superior cavoatrial junction. Similar radiation changes are again noted in the right perihilar region. The spiculated nodule in the right upper lobe is not well visualized on this exam. Small right pleural effusion with basilar atelectasis. Streaky opacities at the right lung base. Hyperinflation with similar diffuse coarsened interstitial markings. No pneumothorax. No acute osseous abnormality. IMPRESSION: 1. Small right pleural effusion with basilar atelectasis. Streaky  opacities at the right lung base could reflect atelectasis/scarring, however, infiltrate can not be excluded. 2. Emphysema. Electronically Signed   By: Hart Robinsons M.D.   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 GI consulted due to drop in Hgb and concern for melena.  Possible melena/decline in Hgb: -unable to obtain history currently, per EDP patient reporting dark stools for several months. DRE not completed. On oral iron. -Hgb was 11.4 in 11/2022, 12 in 02/2023, currently 9.  -EGD 05/2022 with non-bleeding gastric ulcer, single bleeding angiodysplastic lesion in the duodenum treated with APC, multiple nonbleeding angiodysplastic lesions in the duodenum treated with APC.  -lost to follow up for repeat EGD to verify healing of gastric ulcer -at risk of bleeding from additional angiodysplastic lesions -at this time, patient is  not a candidate for endoscopic evaluation given her acute on chronic respiratory failure -IV PPI BID -monitor for overt GI bleeding -transfuse as needed -patient does not need to be NPO from a GI standpoint    LOS: 0 days   We would like to thank you for the opportunity to participate in the care of [facility].  Leanna Battles. Dixon Boos [facility] [phone] 2/13/20252:23 PM

## 2023-05-29 DIAGNOSIS — J189 Pneumonia, unspecified organism: Secondary | ICD-10-CM | POA: Diagnosis not present

## 2023-05-29 DIAGNOSIS — Z791 Long term (current) use of non-steroidal anti-inflammatories (NSAID): Secondary | ICD-10-CM | POA: Diagnosis not present

## 2023-05-29 DIAGNOSIS — J9621 Acute and chronic respiratory failure with hypoxia: Secondary | ICD-10-CM | POA: Diagnosis not present

## 2023-05-29 DIAGNOSIS — D649 Anemia, unspecified: Secondary | ICD-10-CM | POA: Diagnosis not present

## 2023-05-29 DIAGNOSIS — K921 Melena: Secondary | ICD-10-CM | POA: Diagnosis not present

## 2023-05-29 DIAGNOSIS — J441 Chronic obstructive pulmonary disease with (acute) exacerbation: Secondary | ICD-10-CM | POA: Diagnosis not present

## 2023-05-29 LAB — CBC WITH DIFFERENTIAL/PLATELET
Abs Immature Granulocytes: 0.04 10*3/uL (ref 0.00–0.07)
Basophils Absolute: 0 10*3/uL (ref 0.0–0.1)
Basophils Relative: 0 %
Eosinophils Absolute: 0 10*3/uL (ref 0.0–0.5)
Eosinophils Relative: 0 %
HCT: 25.3 % — ABNORMAL LOW (ref 36.0–46.0)
Hemoglobin: 8.2 g/dL — ABNORMAL LOW (ref 12.0–15.0)
Immature Granulocytes: 1 %
Lymphocytes Relative: 6 %
Lymphs Abs: 0.5 10*3/uL — ABNORMAL LOW (ref 0.7–4.0)
MCH: 29.2 pg (ref 26.0–34.0)
MCHC: 32.4 g/dL (ref 30.0–36.0)
MCV: 90 fL (ref 80.0–100.0)
Monocytes Absolute: 0.7 10*3/uL (ref 0.1–1.0)
Monocytes Relative: 9 %
Neutro Abs: 6.8 10*3/uL (ref 1.7–7.7)
Neutrophils Relative %: 84 %
Platelets: 305 10*3/uL (ref 150–400)
RBC: 2.81 MIL/uL — ABNORMAL LOW (ref 3.87–5.11)
RDW: 16 % — ABNORMAL HIGH (ref 11.5–15.5)
WBC: 8.1 10*3/uL (ref 4.0–10.5)
nRBC: 0 % (ref 0.0–0.2)

## 2023-05-29 LAB — BASIC METABOLIC PANEL
Anion gap: 9 (ref 5–15)
BUN: 21 mg/dL (ref 8–23)
CO2: 34 mmol/L — ABNORMAL HIGH (ref 22–32)
Calcium: 8.5 mg/dL — ABNORMAL LOW (ref 8.9–10.3)
Chloride: 99 mmol/L (ref 98–111)
Creatinine, Ser: 0.44 mg/dL (ref 0.44–1.00)
GFR, Estimated: >60 mL/min (ref >60)
Glucose, Bld: 85 mg/dL (ref 70–99)
Potassium: 4 mmol/L (ref 3.5–5.1)
Sodium: 142 mmol/L (ref 135–145)

## 2023-05-29 LAB — MAGNESIUM: Magnesium: 1.8 mg/dL (ref 1.7–2.4)

## 2023-05-29 MED ORDER — SODIUM CHLORIDE 0.9 % IV SOLN
2.0000 g | INTRAVENOUS | Status: AC
  Administered 2023-05-29 – 2023-06-01 (×4): 2 g via INTRAVENOUS
  Filled 2023-05-29 (×4): qty 20

## 2023-05-29 MED ORDER — MAGNESIUM SULFATE 2 GM/50ML IV SOLN
2.0000 g | Freq: Once | INTRAVENOUS | Status: AC
  Administered 2023-05-29: 2 g via INTRAVENOUS
  Filled 2023-05-29: qty 50

## 2023-05-29 NOTE — Plan of Care (Signed)
Problem: Clinical Measurements: Goal: Ability to maintain clinical measurements within normal limits will improve Outcome: Progressing   Problem: Activity: Goal: Risk for activity intolerance will decrease Outcome: Progressing   Problem: Nutrition: Goal: Adequate nutrition will be maintained Outcome: Progressing

## 2023-05-29 NOTE — Progress Notes (Signed)
Gastroenterology Progress Note   Referring Provider: No ref. provider found Primary Care Physician:  Oneal Grout, FNP Primary Gastroenterologist: Hennie Duos. Marletta Lor, DO  Patient ID: Courtney Zuniga; [id number]; 02-08-1951   Subjective   Patient alert and finishing up with speech with bedside swallow exam.  Patient states that she has been taking Advil and ibuprofen fairly frequently along with multiple aspirin powders.  She has been using these to treat headaches.  Currently denies any abdominal pain, nausea, vomiting, or overt dysphagia.  She had originally complained of some pain with swallowing if she takes large bites.  Did well with speech.  She states that way melena began about 3 weeks ago.  No changes in appetite.  Denies any BRBPR.  Objective   Vital signs in last 24 hours Temp:  [97.8 F (36.6 C)-98.8 F (37.1 C)] 98.6 F (37 C) (02/14 0744) Pulse Rate:  [91-110] 106 (02/14 1100) Resp:  [18-36] 32 (02/14 1100) BP: (100-129)/(56-73) 123/69 (02/14 1100) SpO2:  [94 %-100 %] 100 % (02/14 1100) FiO2 (%):  [40 %-50 %] 40 % (02/14 0243) Weight:  [55.6 kg] 55.6 kg (02/13 2021) Last BM Date :  (UTA)  Physical Exam General:   Pleasant.  Acutely ill.  Calm and cooperative. Head:  Normocephalic and atraumatic. Eyes:  No icterus, sclera clear. Conjuctiva pink.  Mouth:  Without lesions, mucosa pink.  Dry. Heart:  S1, S2 present, no murmurs noted.  Lungs: Expiratory wheezing bilaterally.  Increased work of breathing. Abdomen:  Bowel sounds present, soft, non-tender, non-distended. No HSM or hernias noted. No rebound or guarding. No masses appreciated  Neurologic:  Alert and  oriented x4;  grossly normal neurologically. Psych:  Alert and cooperative. Normal mood and affect.  Intake/Output from previous day: No intake/output data recorded. Intake/Output this shift: Total I/O In: 30.1 [IV Piggyback:30.1] Out: -   Lab Results  Recent Labs    05/28/23 0908 05/29/23 0516   WBC 8.5 8.1  HGB 9.0* 8.2*  HCT 28.1* 25.3*  PLT 270 305   BMET Recent Labs    05/28/23 0908 05/29/23 0516  NA 142 142  K 3.9 4.0  CL 99 99  CO2 34* 34*  GLUCOSE 99 85  BUN 11 21  CREATININE 0.53 0.44  CALCIUM 8.8* 8.5*   LFT Recent Labs    05/28/23 0908  PROT 6.8  ALBUMIN 3.3*  AST 18  ALT 12  ALKPHOS 185*  BILITOT 0.2   PT/INR No results for input(s): "LABPROT", "INR" in the last 72 hours. Hepatitis Panel No results for input(s): "HEPBSAG", "HCVAB", "HEPAIGM", "HEPBIGM" in the last 72 hours.  Studies/Results CT Angio Chest PE W/Cm &/Or Wo Cm Result Date: 05/28/2023 CLINICAL DATA:  History of non-small-cell lung cancer with shortness of breath and abdominal pain EXAM: CT ANGIOGRAPHY CHEST CT ABDOMEN AND PELVIS WITH CONTRAST TECHNIQUE: Multidetector CT imaging of the chest was performed using the standard protocol during bolus administration of intravenous contrast. Multiplanar CT image reconstructions and MIPs were obtained to evaluate the vascular anatomy. Multidetector CT imaging of the abdomen and pelvis was performed using the standard protocol during bolus administration of intravenous contrast. RADIATION DOSE REDUCTION: This exam was performed according to the departmental dose-optimization program which includes automated exposure control, adjustment of the mA and/or kV according to patient size and/or use of iterative reconstruction technique. CONTRAST:  OMNIPAQUE IOHEXOL 350 MG/ML SOLN COMPARISON:  CT chest, abdomen, and pelvis dated 03/31/2023 FINDINGS: CTA CHEST FINDINGS Cardiovascular: Right chest wall port  terminates in the right atrium. The study is high quality for the evaluation of pulmonary embolism. There are no filling defects in the central, lobar, segmental or subsegmental pulmonary artery branches to suggest acute pulmonary embolism. Great vessels are normal in course and caliber. Normal heart size. No significant pericardial fluid/thickening.  Coronary artery calcifications. Mediastinum/Nodes: Imaged thyroid gland without nodules meeting criteria for imaging follow-up by size. Normal esophagus. No pathologically enlarged axillary, supraclavicular, mediastinal, or hilar lymph nodes. Lungs/Pleura: The central airways are patent. Increased peribronchial wall thickening, most notable in the right lower lobe with scattered areas of subsegmental mucous plugging in the bilateral lower lobes. New irregular bilateral lower lobe consolidations on a background of bilateral lower lobe scarring/atelectasis. Unchanged severe centrilobular and paraseptal emphysema. Similar appearance of right perihilar and infrahilar postradiation changes. Decreased size of irregular right apical 1.6 x 1.4 cm nodule (5:27), previously 2.0 x 1.2 cm. No pneumothorax. Small right pleural effusion. Musculoskeletal: No acute or abnormal lytic or blastic osseous lesions. Multilevel degenerative changes of the thoracic spine. Review of the MIP images confirms the above findings. CT ABDOMEN and PELVIS FINDINGS Somewhat suboptimal timing of contrast bolus. Hepatobiliary: No focal hepatic lesions. Mild intra and extrahepatic bile duct dilation, likely secondary to cholecystectomy. Pancreas: No focal lesions or main ductal dilation. Spleen: Normal in size without focal abnormality. Adrenals/Urinary Tract: No adrenal nodules. No suspicious renal mass, calculi, or hydronephrosis. No focal bladder wall thickening. Stomach/Bowel: Normal appearance of the stomach. No evidence of bowel wall thickening, distention, or inflammatory changes. Colonic diverticulosis without acute diverticulitis. Appendix is not discretely seen. Vascular/Lymphatic: Aortic atherosclerosis. No enlarged abdominal or pelvic lymph nodes. Reproductive: Mildly lobulated uterus with multifocal coarse calcifications, likely leiomyomata. No definite adnexal masses. Other: No free fluid, fluid collection, or free air. Musculoskeletal:  No acute or abnormal lytic or blastic osseous lesions. Multilevel degenerative changes of the lumbar spine. IMPRESSION: 1. No evidence of pulmonary embolism. 2. Increased peribronchial wall thickening, most notable in the right lower lobe with scattered areas of subsegmental mucous plugging in the bilateral lower lobes, likely bronchitis. 3. New irregular bilateral lower lobe consolidations on a background of bilateral lower lobe scarring/atelectasis, suspicious for pneumonia. 4. Small right pleural effusion. 5. Slightly decreased size of irregular right apical nodule. 6. No acute findings in the abdomen or pelvis. 7. Colonic diverticulosis without acute diverticulitis. 8. Aortic Atherosclerosis (ICD10-I70.0) and Emphysema (ICD10-J43.9). Electronically Signed   By: Agustin Cree M.D.   On: 05/28/2023 12:05   CT ABDOMEN PELVIS W CONTRAST Result Date: 05/28/2023 CLINICAL DATA:  History of non-small-cell lung cancer with shortness of breath and abdominal pain EXAM: CT ANGIOGRAPHY CHEST CT ABDOMEN AND PELVIS WITH CONTRAST TECHNIQUE: Multidetector CT imaging of the chest was performed using the standard protocol during bolus administration of intravenous contrast. Multiplanar CT image reconstructions and MIPs were obtained to evaluate the vascular anatomy. Multidetector CT imaging of the abdomen and pelvis was performed using the standard protocol during bolus administration of intravenous contrast. RADIATION DOSE REDUCTION: This exam was performed according to the departmental dose-optimization program which includes automated exposure control, adjustment of the mA and/or kV according to patient size and/or use of iterative reconstruction technique. CONTRAST:  OMNIPAQUE IOHEXOL 350 MG/ML SOLN COMPARISON:  CT chest, abdomen, and pelvis dated 03/31/2023 FINDINGS: CTA CHEST FINDINGS Cardiovascular: Right chest wall port terminates in the right atrium. The study is high quality for the evaluation of pulmonary embolism.  There are no filling defects in the central, lobar, segmental or subsegmental  pulmonary artery branches to suggest acute pulmonary embolism. Great vessels are normal in course and caliber. Normal heart size. No significant pericardial fluid/thickening. Coronary artery calcifications. Mediastinum/Nodes: Imaged thyroid gland without nodules meeting criteria for imaging follow-up by size. Normal esophagus. No pathologically enlarged axillary, supraclavicular, mediastinal, or hilar lymph nodes. Lungs/Pleura: The central airways are patent. Increased peribronchial wall thickening, most notable in the right lower lobe with scattered areas of subsegmental mucous plugging in the bilateral lower lobes. New irregular bilateral lower lobe consolidations on a background of bilateral lower lobe scarring/atelectasis. Unchanged severe centrilobular and paraseptal emphysema. Similar appearance of right perihilar and infrahilar postradiation changes. Decreased size of irregular right apical 1.6 x 1.4 cm nodule (5:27), previously 2.0 x 1.2 cm. No pneumothorax. Small right pleural effusion. Musculoskeletal: No acute or abnormal lytic or blastic osseous lesions. Multilevel degenerative changes of the thoracic spine. Review of the MIP images confirms the above findings. CT ABDOMEN and PELVIS FINDINGS Somewhat suboptimal timing of contrast bolus. Hepatobiliary: No focal hepatic lesions. Mild intra and extrahepatic bile duct dilation, likely secondary to cholecystectomy. Pancreas: No focal lesions or main ductal dilation. Spleen: Normal in size without focal abnormality. Adrenals/Urinary Tract: No adrenal nodules. No suspicious renal mass, calculi, or hydronephrosis. No focal bladder wall thickening. Stomach/Bowel: Normal appearance of the stomach. No evidence of bowel wall thickening, distention, or inflammatory changes. Colonic diverticulosis without acute diverticulitis. Appendix is not discretely seen. Vascular/Lymphatic: Aortic  atherosclerosis. No enlarged abdominal or pelvic lymph nodes. Reproductive: Mildly lobulated uterus with multifocal coarse calcifications, likely leiomyomata. No definite adnexal masses. Other: No free fluid, fluid collection, or free air. Musculoskeletal: No acute or abnormal lytic or blastic osseous lesions. Multilevel degenerative changes of the lumbar spine. IMPRESSION: 1. No evidence of pulmonary embolism. 2. Increased peribronchial wall thickening, most notable in the right lower lobe with scattered areas of subsegmental mucous plugging in the bilateral lower lobes, likely bronchitis. 3. New irregular bilateral lower lobe consolidations on a background of bilateral lower lobe scarring/atelectasis, suspicious for pneumonia. 4. Small right pleural effusion. 5. Slightly decreased size of irregular right apical nodule. 6. No acute findings in the abdomen or pelvis. 7. Colonic diverticulosis without acute diverticulitis. 8. Aortic Atherosclerosis (ICD10-I70.0) and Emphysema (ICD10-J43.9). Electronically Signed   By: Agustin Cree M.D.   On: 05/28/2023 12:05   DG Chest 2 View Result Date: 05/28/2023 CLINICAL DATA:  Shortness of breath. EXAM: CHEST - 2 VIEW COMPARISON:  Chest radiograph dated 02/23/2023. CT chest dated 03/31/2023. FINDINGS: Stable cardiomediastinal silhouette. Aortic atherosclerosis. Stable right chest Port-A-Cath tip overlies the superior cavoatrial junction. Similar radiation changes are again noted in the right perihilar region. The spiculated nodule in the right upper lobe is not well visualized on this exam. Small right pleural effusion with basilar atelectasis. Streaky opacities at the right lung base. Hyperinflation with similar diffuse coarsened interstitial markings. No pneumothorax. No acute osseous abnormality. IMPRESSION: 1. Small right pleural effusion with basilar atelectasis. Streaky opacities at the right lung base could reflect atelectasis/scarring, however, infiltrate can not be  excluded. 2. Emphysema. Electronically Signed   By: Hart Robinsons M.D.   On: 05/28/2023 10:37    Assessment  73 y.o. 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  GI consulted given concerns for further drop in hemoglobin and reports of melena.  Anemia, melena: Patient does admit to frequent use of NSAIDs as well as BC/Goody powders which she uses for headaches.  Did not complete digital rectal exam given patient with increased work of breathing currently.  Hemoglobin 11.4 in August 2024, 12 in November 2024, was 9 on admission.  Has slight decline of hemoglobin this morning to 8.2.  EGD in February 2024 with nonbleeding gastric ulcer, single bleeding angiodysplastic lesion in the duodenum treated with APC therapy and other multiple nonbleeding lesions in the duodenum treated with APC.  She was recommended to have EGD to verify repeat healing of gastric ulceration but was lost to follow-up.    Given ongoing high-dose NSAID use, upper GI source of bleeding with possible oozing from ulceration versus rebleeding from angiodysplastic lesions is most likely.  She is not stable currently for any procedure, would need endoscopic evaluation after improved from a respiratory standpoint.  For now continue to trend H/H, transfuse as needed, and continue PPI IV twice daily.   Plan / Recommendations  IV PPI BID Monitor H/H, transfuse for hgb <7 Diet as tolerated for now Monitor for overt GI bleeding and hemodynamic  instability Please reach out to GI when patient stable for endoscopic evaluation.     LOS: 1 day    05/29/2023, 11:29 AM   Brooke Bonito, MSN, FNP-BC, AGACNP-BC [facility]

## 2023-05-29 NOTE — TOC Initial Note (Signed)
Transition of Care [facility]) - Initial/Assessment Note    Patient Details  Name: Courtney Zuniga MRN: [id number] Date of Birth: 07/15/1950  Transition of Care [facility]) CM/SW Contact:    Villa Herb, LCSWA Phone Number: 05/29/2023, 1:27 PM  Clinical Narrative:                 Pt is high risk for readmission. CSW spoke with pt and daughter at bedside to complete assessment. Pts son lives with her. Pts family can offer assistance with ADLs if needed. Pt and family are interested in [facility] being set up. Pt has a shower chair, home O2 through [employer] and a walker. Pts daughter states pt is having issues with home portable. CSW reached out to [place] with [employer] to request a work order be placed. TOC to follow.   Expected Discharge Plan: Home w Home Health Services Barriers to Discharge: Continued Medical Work up   Patient Goals and CMS Choice Patient states their goals for this hospitalization and ongoing recovery are:: return home CMS Medicare.gov Compare Post Acute Care list provided to:: Patient Choice offered to / list presented to : Patient, Adult Children      Expected Discharge Plan and Services In-house Referral: Clinical Social Work Discharge Planning Services: CM Consult Post Acute Care Choice: Home Health Living arrangements for the past 2 months: Single Family Home                                      Prior Living Arrangements/Services Living arrangements for the past 2 months: Single Family Home Lives with:: Adult Children Patient language and need for interpreter reviewed:: Yes Do you feel safe going back to the place where you live?: Yes      Need for Family Participation in Patient Care: Yes (Comment) Care giver support system in place?: Yes (comment) Current home services: DME Criminal Activity/Legal Involvement Pertinent to Current Situation/Hospitalization: No - Comment as needed  Activities of Daily Living   ADL Screening (condition at time of  admission) Independently performs ADLs?: Yes (appropriate for developmental age) (pt reports living at home alone) Is the patient deaf or have difficulty hearing?: No Does the patient have difficulty seeing, even when wearing glasses/contacts?: No Does the patient have difficulty concentrating, remembering, or making decisions?: No  Permission Sought/Granted                  Emotional Assessment Appearance:: Appears stated age Attitude/Demeanor/Rapport: Engaged Affect (typically observed): Accepting Orientation: : Oriented to Self, Oriented to Place, Oriented to  Time, Oriented to Situation Alcohol / Substance Use: Not Applicable Psych Involvement: No (comment)  Admission diagnosis:  Pleural effusion [J90] Upper GI bleed [K92.2] Acute and chronic respiratory failure with hypoxia (HCC) [J96.21] Acute on chronic respiratory failure with hypoxia (HCC) [J96.21] Anemia, unspecified type [D64.9] Pneumonia due to infectious organism, unspecified laterality, unspecified part of lung [J18.9] Patient Active Problem List   Diagnosis Date Noted   Acute and chronic respiratory failure with hypoxia (HCC) 05/28/2023   Sinus tachycardia 10/13/2022   (HFpEF) heart failure with preserved ejection fraction (HCC) 10/13/2022   Aortic regurgitation 10/13/2022   Aortic stenosis 10/13/2022   Nicotine abuse 10/13/2022   SOB (shortness of breath) 10/06/2022   Acute on chronic respiratory failure with hypoxia (HCC) 08/25/2022   Pleural effusion 08/25/2022   Hypotension 06/12/2022   Iron deficiency anemia 06/12/2022   Acute on chronic anemia 05/20/2022  History of Gastric ulcer 05/20/2022   AVM (arteriovenous malformation) of small bowel, 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   Upper GI bleed 05/18/2022   Acute blood loss anemia 05/18/2022   Acute respiratory failure with hypoxia (HCC) 04/23/2022   Frequent PVCs 04/09/2022   Port-A-Cath in place 03/03/2022   Increased frequency of headaches 10/18/2020   COPD  exacerbation (HCC) 11/05/2019   Chronic nonintractable headache    Acute on chronic respiratory failure with hypoxia and hypercapnia (HCC) 06/16/2018   Influenza A 06/16/2018   Severe protein-calorie malnutrition (HCC) 06/16/2018   GERD (gastroesophageal reflux disease) 06/16/2018   Bilateral pneumonia 05/05/2018   Sepsis due to CAP 05/05/2018   Chronic respiratory failure with hypoxia (HCC) 05/05/2018   Encounter for antineoplastic immunotherapy 04/23/2017   Depression 05/15/2016   Adenocarcinoma of right lung, stage 4 01/10/2016   Encounter for antineoplastic chemotherapy 01/10/2016   Lung mass 01/03/2016   Arthritis    Subepithelial gastric mass    Mucosal abnormality of stomach    Abdominal pain 08/08/2015   Abnormal CT of the abdomen 08/08/2015   Hypoxia 06/26/2014   COPD with acute exacerbation (HCC) 06/26/2014   CAP (community acquired pneumonia)    Chest pain of uncertain etiology    Asthma, chronic    Chest pain 02/16/2014   HTN (hypertension) 02/16/2014   DDD (degenerative disc disease), lumbar 02/04/2011   Lumbar herniated disc 01/09/2011   PCP:  Oneal Grout, FNP Pharmacy:   [facility] - [place], [place] - [address] [place] [address] Phone: [phone] Fax: [phone]  [facility] - [place], [place] - [address] [place] [address] Phone: [phone] Fax: [phone]     Social Drivers of Health (SDOH) Social History: SDOH Screenings   Food Insecurity: Patient Unable To Answer (05/28/2023)  Housing: Patient Unable To Answer (05/28/2023)  Transportation Needs: Patient Unable To Answer (05/28/2023)  Utilities: Patient Unable To Answer (05/28/2023)  Financial Resource Strain: Low Risk  (05/05/2018)  Physical Activity: Insufficiently Active (05/05/2018)  Social Connections: Patient Unable To Answer (05/28/2023)  Stress: No Stress Concern Present (05/05/2018)  Tobacco  Use: Medium Risk (05/28/2023)   SDOH Interventions:     Readmission Risk Interventions    05/29/2023    1:14 PM 08/26/2022   10:43 AM 05/19/2022   12:12 PM  Readmission Risk Prevention Plan  Transportation Screening Complete Complete Complete  HRI or Home Care Consult Complete  Complete  Social Work Consult for Recovery Care Planning/Counseling Complete  Complete  Palliative Care Screening Not Applicable  Not Applicable  Medication Review Oceanographer) Complete Complete Complete  HRI or Home Care Consult  Complete   SW Recovery Care/Counseling Consult  Complete   Palliative Care Screening  Not Applicable   Skilled Nursing Facility  Not Applicable

## 2023-05-29 NOTE — Plan of Care (Signed)
  Problem: Coping: Goal: Level of anxiety will decrease Outcome: Progressing   Problem: Pain Managment: Goal: General experience of comfort will improve and/or be controlled Outcome: Progressing   Problem: Skin Integrity: Goal: Risk for impaired skin integrity will decrease Outcome: Progressing

## 2023-05-29 NOTE — Evaluation (Signed)
Clinical/Bedside Swallow Evaluation Patient Details  Name: Courtney Zuniga MRN: [id number] Date of Birth: [birth year]  Today's Date: 05/29/2023 Time: SLP Start Time (ACUTE ONLY): 1010 SLP Stop Time (ACUTE ONLY): 1036 SLP Time Calculation (min) (ACUTE ONLY): 26 min  Past Medical History:  Past Medical History:  Diagnosis Date   Anemia    as a young woman   Arthritis    osteoartritis   Asthma    Brain tumor (benign) (HCC) 2005 [facility]   Benign   Chronic headaches    Chronic hip pain    Chronic pain    COPD (chronic obstructive pulmonary disease) (HCC)    Coronary artery disease    Depression    Depression 05/15/2016   Encounter for antineoplastic chemotherapy 01/10/2016   GERD (gastroesophageal reflux disease)    Hypertension    Lung cancer (HCC) dx'd 01/2016   currently on chemo and radiation    NSTEMI (non-ST elevated myocardial infarction) (HCC) yrs ago   On home O2    qhs 2 liters at hs and prn   Pneumonia last time 2 yrs ago   Shortness of breath dyspnea    with activity   Past Surgical History:  Past Surgical History:  Procedure Laterality Date   CHOLECYSTECTOMY     COLONOSCOPY  2015   Results requested from [facility]   COLONOSCOPY     ESOPHAGOGASTRODUODENOSCOPY N/A 08/14/2015   Procedure: ESOPHAGOGASTRODUODENOSCOPY (EGD);  Surgeon: Corbin Ade, MD;  [facility];  Service: Endoscopy;  Laterality: N/A;  215    ESOPHAGOGASTRODUODENOSCOPY (EGD) WITH PROPOFOL N/A 09/13/2015   Procedure: ESOPHAGOGASTRODUODENOSCOPY (EGD) WITH PROPOFOL;  Surgeon: Rachael Fee, MD;  Location: [facility];  Service: Endoscopy;  Laterality: N/A;   ESOPHAGOGASTRODUODENOSCOPY (EGD) WITH PROPOFOL N/A 05/19/2022   Procedure: ESOPHAGOGASTRODUODENOSCOPY (EGD) WITH PROPOFOL;  Surgeon: Lanelle Bal, DO;  Location: [facility];  Service: Endoscopy;  Laterality: N/A;   EUS N/A 03/12/2017   Procedure: UPPER ENDOSCOPIC ULTRASOUND (EUS) RADIAL;  Surgeon: Rachael Fee,  MD;  Location: [facility];  Service: Endoscopy;  Laterality: N/A;   HOT HEMOSTASIS  05/19/2022   Procedure: HOT HEMOSTASIS (ARGON PLASMA COAGULATION/BICAP);  Surgeon: Lanelle Bal, DO;  Location: [facility];  Service: Endoscopy;;   IR IMAGING GUIDED PORT INSERTION  01/20/2022   TUMOR REMOVAL  2005   Benign   UPPER ESOPHAGEAL ENDOSCOPIC ULTRASOUND (EUS)  09/13/2015   Procedure: UPPER ESOPHAGEAL ENDOSCOPIC ULTRASOUND (EUS);  Surgeon: Rachael Fee, MD;  Location: [facility];  Service: Endoscopy;;   VIDEO BRONCHOSCOPY WITH ENDOBRONCHIAL NAVIGATION N/A 12/31/2015   Procedure: VIDEO BRONCHOSCOPY WITH ENDOBRONCHIAL NAVIGATION;  Surgeon: Loreli Slot, MD;  Location: [facility] OR;  Service: Thoracic;  Laterality: N/A;   VIDEO BRONCHOSCOPY WITH ENDOBRONCHIAL ULTRASOUND N/A 11/08/2015   Procedure: VIDEO BRONCHOSCOPY WITH ENDOBRONCHIAL ULTRASOUND;  Surgeon: Kerin Perna, MD;  Location: [facility] OR;  Service: Thoracic;  Laterality: N/A;   HPI:  [name] is a 73 year old female with COPD, chronic hypoxic respiratory failure, oxygen dependent on 4 L/min, history of recurrent non-small cell lung cancer, coronary artery disease history of NSTEMI, history of gastric ulcers, history of duodenal AVMs, currently on surveillance therapy for lung cancer after immunotherapy treatments, receiving CT scans every 4 months, reportedly has been having increasing shortness of breath symptoms over the past week that became acutely worse overnight.  She presented to the emergency department today with worsening shortness of breath symptoms and also reported lower abdominal pain.  She reports  needing more than her baseline O2 requirement of 4 L/min had had increased it to 6 L/min had had oxygen saturation around 91%.  She reportedly was 87% on 4 L/min.  She was given bronchodilators in the emergency department with some improvement but not back to baseline.  She is reporting nonproductive cough at this time.   Influenza, RSV and SARS 2 coronavirus testing negative.  In the ED she received imaging studies with findings consistent with pneumonia.  She was also noted to have a drop in her baseline hemoglobin and reported having intermittent melanotic stools.  Patient was started on broad-spectrum antibiotic therapy in addition to scheduled bronchodilator therapy and steroids and admission requested.    Assessment / Plan / Recommendation  Clinical Impression  Clinical swallowing evaluation completed while Pt was sitting upright in bed. Pt reports some occasional discomfort with swallowing when she takes "too big of bites". Pt consumed thin liquids and regular textures without overt s/sx of oropharyngeal dysphagia. RN reports some fluctuation in mentation and that Pt was unable to suck the straw last night. Recommend Pt only be fed when aware and engaged to PO provided. SLP reviewed universal aspiration precautions. Recommend continue with D2/fine chop and thin liquid diet. Recommend small bites/sips, thorough mastication and alternating bites/sips. Meds are ok whole with liquids. No further ST needs noted at this time, our service will sign off. Thank you. SLP Visit Diagnosis: Dysphagia, unspecified (R13.10)    Aspiration Risk  Mild aspiration risk    Diet Recommendation Dysphagia 2 (Fine chop);Thin liquid    Liquid Administration via: Cup;Straw Medication Administration: Whole meds with liquid Supervision: Patient able to self feed Compensations: Minimize environmental distractions;Small sips/bites;Follow solids with liquid Postural Changes: Seated upright at 90 degrees    Other  Recommendations Oral Care Recommendations: Oral care BID    Recommendations for follow up therapy are one component of a multi-disciplinary discharge planning process, led by the attending physician.  Recommendations may be updated based on patient status, additional functional criteria and insurance authorization.  Follow up  Recommendations No SLP follow up      Assistance Recommended at Discharge    Functional Status Assessment Patient has had a recent decline in their functional status and demonstrates the ability to make significant improvements in function in a reasonable and predictable amount of time.  Frequency and Duration min 1 x/week  1 week       Prognosis        Swallow Study   General Date of Onset: 05/28/23 HPI: [name] is a 73 year old female with COPD, chronic hypoxic respiratory failure, oxygen dependent on 4 L/min, history of recurrent non-small cell lung cancer, coronary artery disease history of NSTEMI, history of gastric ulcers, history of duodenal AVMs, currently on surveillance therapy for lung cancer after immunotherapy treatments, receiving CT scans every 4 months, reportedly has been having increasing shortness of breath symptoms over the past week that became acutely worse overnight.  She presented to the emergency department today with worsening shortness of breath symptoms and also reported lower abdominal pain.  She reports needing more than her baseline O2 requirement of 4 L/min had had increased it to 6 L/min had had oxygen saturation around 91%.  She reportedly was 87% on 4 L/min.  She was given bronchodilators in the emergency department with some improvement but not back to baseline.  She is reporting nonproductive cough at this time.  Influenza, RSV and SARS 2 coronavirus testing negative.  In the ED  she received imaging studies with findings consistent with pneumonia.  She was also noted to have a drop in her baseline hemoglobin and reported having intermittent melanotic stools.  Patient was started on broad-spectrum antibiotic therapy in addition to scheduled bronchodilator therapy and steroids and admission requested. Type of Study: Bedside Swallow Evaluation Previous Swallow Assessment: BSE Feb 2024 Diet Prior to this Study: Dysphagia 2 (finely chopped);Thin liquids  (Level 0) Temperature Spikes Noted: No Respiratory Status: Nasal cannula History of Recent Intubation: No Behavior/Cognition: Alert;Cooperative Oral Cavity Assessment: Within Functional Limits Oral Care Completed by SLP: Recent completion by staff Oral Cavity - Dentition: Edentulous Vision: Functional for self-feeding Self-Feeding Abilities: Able to feed self Patient Positioning: Upright in bed Baseline Vocal Quality: Normal Volitional Cough: Strong Volitional Swallow: Able to elicit    Oral/Motor/Sensory Function Overall Oral Motor/Sensory Function: Within functional limits   Ice Chips Ice chips: Within functional limits   Thin Liquid Thin Liquid: Within functional limits    Nectar Thick Nectar Thick Liquid: Not tested   Honey Thick Honey Thick Liquid: Not tested   Puree Puree: Within functional limits   Solid     Solid: Within functional limits     Courtney Zuniga H. Romie Levee, CCC-SLP Speech Language Pathologist  Georgetta Haber 05/29/2023,11:54 AM

## 2023-05-29 NOTE — Progress Notes (Signed)
PROGRESS NOTE   Courtney Zuniga  ZOX:[id number] DOB: [birth year] DOA: 05/28/2023 PCP: Oneal Grout, FNP   Chief Complaint  Patient presents with   Shortness of Breath   Level of care: Stepdown  Brief Admission History:  73-year-old female with COPD, chronic hypoxic respiratory failure, oxygen dependent on 4 L/min, history of recurrent non-small cell lung cancer, coronary artery disease history of NSTEMI, history of gastric ulcers, history of duodenal AVMs, currently on surveillance therapy for lung cancer after immunotherapy treatments, receiving CT scans every 4 months, reportedly has been having increasing shortness of breath symptoms over the past week that became acutely worse overnight.  She presented to the emergency department today with worsening shortness of breath symptoms and also reported lower abdominal pain.  She reports needing more than her baseline O2 requirement of 4 L/min had had increased it to 6 L/min had had oxygen saturation around 91%.  She reportedly was 87% on 4 L/min.  She was given bronchodilators in the emergency department with some improvement but not back to baseline.  She is reporting nonproductive cough at this time.  Influenza, RSV and SARS 2 coronavirus testing negative.  In the ED she received imaging studies with findings consistent with pneumonia.  She was also noted to have a drop in her baseline hemoglobin and reported having intermittent melanotic stools.  Patient was started on broad-spectrum antibiotic therapy in addition to scheduled bronchodilator therapy and steroids and admission requested.  GI was consulted and recommended patient remain n.p.o. until GI consultation and started on IV pantoprazole for GI protection.   Assessment and Plan:  Acute on chronic respiratory failure with hypoxia -Secondary to community-acquired pneumonia and COPD with acute exacerbation -Continue broad-spectrum antibiotic treatment as noted -Continue scheduled  bronchodilators and IV steroids -BiPAP as needed order in place and will monitor in stepdown ICU overnight -influenza, RSV, covid tests were negative    Community-acquired pneumonia -Continue azithromycin and ceftriaxone as ordered -Adding mucolytic's and cough suppressants -continue scheduled bronchodilators -follow blood cultures and sputum culture   COPD with acute exacerbation  -continue IV steroids and IV antibiotics -continue scheduled bronchodilators -continue mucolytics and cough suppressant -goal pulse ox is >88% given longstanding COPD and chronic hypoxia    Acute on chronic anemia -Hg down to 9 from baseline 11 -reporting melanotic stools -appreciate GI consultation and recommendations -IV pantoprazole for GI protection -type and screen, transfuse for Hg<7 -anemia panel pending    Stage IIIa non-small cell lung cancer - Followed closely by Dr. Shirline Frees with CT scanning every 4 months - Currently on observation therapy as she has completed immunotherapy treatments   Question of recurrent GI bleed History of Gastric Ulcer History of duodenal AVMs -appreciate GI consultation -pantoprazole ordered for GI protection -follow Hg closely    DVT prophylaxis:  SCDs Code Status: Full  Family Communication:  Disposition: TBD   Consultants:  GI  Procedures:   Antimicrobials:  CTX 2/13>> AZTH 2/13>>   Subjective: Pt off bipap this morning, still speaking in short sentences.  No chest pain and remains very SOB.    Objective: Vitals:   05/29/23 0620 05/29/23 0720 05/29/23 0722 05/29/23 0744  BP: 119/69     Pulse: (!) 106   (!) 104  Resp: (!) 35   (!) 21  Temp:    98.6 F (37 C)  TempSrc:    Oral  SpO2: 100% 100% 100% 100%  Weight:      Height:       No intake  or output data in the 24 hours ending 05/29/23 0857 Filed Weights   05/28/23 0914 05/28/23 2021  Weight: 56.5 kg 55.6 kg   Examination:  General exam: Appears calm and comfortable, facial droop,  left eye ptosis  Respiratory system: poor air movement, diffuse insp/exp wheezing heard.  Cardiovascular system: normal S1 & S2 heard. No JVD, murmurs, rubs, gallops or clicks. No pedal edema. Gastrointestinal system: Abdomen is nondistended, soft and nontender. No organomegaly or masses felt. Normal bowel sounds heard. Central nervous system: Alert and oriented. No focal neurological deficits. Extremities: Symmetric 5 x 5 power. Skin: No rashes, lesions or ulcers. Psychiatry: Judgement and insight appear normal. Mood & affect appropriate.   Data Reviewed: I have personally reviewed following labs and imaging studies  CBC: Recent Labs  Lab 05/28/23 0908 05/29/23 0516  WBC 8.5 8.1  NEUTROABS 7.4 6.8  HGB 9.0* 8.2*  HCT 28.1* 25.3*  MCV 91.5 90.0  PLT 270 305    Basic Metabolic Panel: Recent Labs  Lab 05/28/23 0908 05/29/23 0516  NA 142 142  K 3.9 4.0  CL 99 99  CO2 34* 34*  GLUCOSE 99 85  BUN 11 21  CREATININE 0.53 0.44  CALCIUM 8.8* 8.5*  MG  --  1.8    CBG: No results for input(s): "GLUCAP" in the last 168 hours.  Recent Results (from the past 240 hours)  Resp panel by RT-PCR (RSV, Flu A&B, Covid) Anterior Nasal Swab     Status: None   Collection Time: 05/28/23  9:09 AM   Specimen: Anterior Nasal Swab  Result Value Ref Range Status   SARS Coronavirus 2 by RT PCR NEGATIVE NEGATIVE Final    Comment: (NOTE) SARS-CoV-2 target nucleic acids are NOT DETECTED.  The SARS-CoV-2 RNA is generally detectable in upper respiratory specimens during the acute phase of infection. The lowest concentration of SARS-CoV-2 viral copies this assay can detect is 138 copies/mL. A negative result does not preclude SARS-Cov-2 infection and should not be used as the sole basis for treatment or other patient management decisions. A negative result may occur with  improper specimen collection/handling, submission of specimen other than nasopharyngeal swab, presence of viral mutation(s)  within the areas targeted by this assay, and inadequate number of viral copies(<138 copies/mL). A negative result must be combined with clinical observations, patient history, and epidemiological information. The expected result is Negative.  Fact Sheet for Patients:  BloggerCourse.com  Fact Sheet for Healthcare Providers:  SeriousBroker.it  This test is no t yet approved or cleared by the [place] FDA and  has been authorized for detection and/or diagnosis of SARS-CoV-2 by FDA under an Emergency Use Authorization (EUA). This EUA will remain  in effect (meaning this test can be used) for the duration of the COVID-19 declaration under Section 564(b)(1) of the Act, 21 U.S.C.section 360bbb-3(b)(1), unless the authorization is terminated  or revoked sooner.       Influenza A by PCR NEGATIVE NEGATIVE Final   Influenza B by PCR NEGATIVE NEGATIVE Final    Comment: (NOTE) The Xpert Xpress SARS-CoV-2/FLU/RSV plus assay is intended as an aid in the diagnosis of influenza from Nasopharyngeal swab specimens and should not be used as a sole basis for treatment. Nasal washings and aspirates are unacceptable for Xpert Xpress SARS-CoV-2/FLU/RSV testing.  Fact Sheet for Patients: BloggerCourse.com  Fact Sheet for Healthcare Providers: SeriousBroker.it  This test is not yet approved or cleared by the [place] FDA and has been authorized for detection and/or diagnosis  of SARS-CoV-2 by FDA under an Emergency Use Authorization (EUA). This EUA will remain in effect (meaning this test can be used) for the duration of the COVID-19 declaration under Section 564(b)(1) of the Act, 21 U.S.C. section 360bbb-3(b)(1), unless the authorization is terminated or revoked.     Resp Syncytial Virus by PCR NEGATIVE NEGATIVE Final    Comment: (NOTE) Fact Sheet for  Patients: BloggerCourse.com  Fact Sheet for Healthcare Providers: SeriousBroker.it  This test is not yet approved or cleared by the [place] FDA and has been authorized for detection and/or diagnosis of SARS-CoV-2 by FDA under an Emergency Use Authorization (EUA). This EUA will remain in effect (meaning this test can be used) for the duration of the COVID-19 declaration under Section 564(b)(1) of the Act, 21 U.S.C. section 360bbb-3(b)(1), unless the authorization is terminated or revoked.  Performed at [facility], [address] [place], [place] [address]   MRSA Next Gen by PCR, Nasal     Status: None   Collection Time: 05/28/23  8:50 PM   Specimen: Nasal Mucosa; Nasal Swab  Result Value Ref Range Status   MRSA by PCR Next Gen NOT DETECTED NOT DETECTED Final    Comment: (NOTE) The GeneXpert MRSA Assay (FDA approved for NASAL specimens only), is one component of a comprehensive MRSA colonization surveillance program. It is not intended to diagnose MRSA infection nor to guide or monitor treatment for MRSA infections. Test performance is not FDA approved in patients less than 73 years old. Performed at [facility], [address], [place], [place] [address]      Radiology Studies: CT Angio Chest PE W/Cm &/Or Wo Cm Result Date: 05/28/2023 CLINICAL DATA:  History of non-small-cell lung cancer with shortness of breath and abdominal pain EXAM: CT ANGIOGRAPHY CHEST CT ABDOMEN AND PELVIS WITH CONTRAST TECHNIQUE: Multidetector CT imaging of the chest was performed using the standard protocol during bolus administration of intravenous contrast. Multiplanar CT image reconstructions and MIPs were obtained to evaluate the vascular anatomy. Multidetector CT imaging of the abdomen and pelvis was performed using the standard protocol during bolus administration of intravenous contrast. RADIATION DOSE REDUCTION: This exam was performed according  to the departmental dose-optimization program which includes automated exposure control, adjustment of the mA and/or kV according to patient size and/or use of iterative reconstruction technique. CONTRAST:  OMNIPAQUE IOHEXOL 350 MG/ML SOLN COMPARISON:  CT chest, abdomen, and pelvis dated 03/31/2023 FINDINGS: CTA CHEST FINDINGS Cardiovascular: Right chest wall port terminates in the right atrium. The study is high quality for the evaluation of pulmonary embolism. There are no filling defects in the central, lobar, segmental or subsegmental pulmonary artery branches to suggest acute pulmonary embolism. Great vessels are normal in course and caliber. Normal heart size. No significant pericardial fluid/thickening. Coronary artery calcifications. Mediastinum/Nodes: Imaged thyroid gland without nodules meeting criteria for imaging follow-up by size. Normal esophagus. No pathologically enlarged axillary, supraclavicular, mediastinal, or hilar lymph nodes. Lungs/Pleura: The central airways are patent. Increased peribronchial wall thickening, most notable in the right lower lobe with scattered areas of subsegmental mucous plugging in the bilateral lower lobes. New irregular bilateral lower lobe consolidations on a background of bilateral lower lobe scarring/atelectasis. Unchanged severe centrilobular and paraseptal emphysema. Similar appearance of right perihilar and infrahilar postradiation changes. Decreased size of irregular right apical 1.6 x 1.4 cm nodule (5:27), previously 2.0 x 1.2 cm. No pneumothorax. Small right pleural effusion. Musculoskeletal: No acute or abnormal lytic or blastic osseous lesions. Multilevel degenerative changes of the  thoracic spine. Review of the MIP images confirms the above findings. CT ABDOMEN and PELVIS FINDINGS Somewhat suboptimal timing of contrast bolus. Hepatobiliary: No focal hepatic lesions. Mild intra and extrahepatic bile duct dilation, likely secondary to cholecystectomy.  Pancreas: No focal lesions or main ductal dilation. Spleen: Normal in size without focal abnormality. Adrenals/Urinary Tract: No adrenal nodules. No suspicious renal mass, calculi, or hydronephrosis. No focal bladder wall thickening. Stomach/Bowel: Normal appearance of the stomach. No evidence of bowel wall thickening, distention, or inflammatory changes. Colonic diverticulosis without acute diverticulitis. Appendix is not discretely seen. Vascular/Lymphatic: Aortic atherosclerosis. No enlarged abdominal or pelvic lymph nodes. Reproductive: Mildly lobulated uterus with multifocal coarse calcifications, likely leiomyomata. No definite adnexal masses. Other: No free fluid, fluid collection, or free air. Musculoskeletal: No acute or abnormal lytic or blastic osseous lesions. Multilevel degenerative changes of the lumbar spine. IMPRESSION: 1. No evidence of pulmonary embolism. 2. Increased peribronchial wall thickening, most notable in the right lower lobe with scattered areas of subsegmental mucous plugging in the bilateral lower lobes, likely bronchitis. 3. New irregular bilateral lower lobe consolidations on a background of bilateral lower lobe scarring/atelectasis, suspicious for pneumonia. 4. Small right pleural effusion. 5. Slightly decreased size of irregular right apical nodule. 6. No acute findings in the abdomen or pelvis. 7. Colonic diverticulosis without acute diverticulitis. 8. Aortic Atherosclerosis (ICD10-I70.0) and Emphysema (ICD10-J43.9). Electronically Signed   By: Agustin Cree M.D.   On: 05/28/2023 12:05   CT ABDOMEN PELVIS W CONTRAST Result Date: 05/28/2023 CLINICAL DATA:  History of non-small-cell lung cancer with shortness of breath and abdominal pain EXAM: CT ANGIOGRAPHY CHEST CT ABDOMEN AND PELVIS WITH CONTRAST TECHNIQUE: Multidetector CT imaging of the chest was performed using the standard protocol during bolus administration of intravenous contrast. Multiplanar CT image reconstructions and  MIPs were obtained to evaluate the vascular anatomy. Multidetector CT imaging of the abdomen and pelvis was performed using the standard protocol during bolus administration of intravenous contrast. RADIATION DOSE REDUCTION: This exam was performed according to the departmental dose-optimization program which includes automated exposure control, adjustment of the mA and/or kV according to patient size and/or use of iterative reconstruction technique. CONTRAST:  OMNIPAQUE IOHEXOL 350 MG/ML SOLN COMPARISON:  CT chest, abdomen, and pelvis dated 03/31/2023 FINDINGS: CTA CHEST FINDINGS Cardiovascular: Right chest wall port terminates in the right atrium. The study is high quality for the evaluation of pulmonary embolism. There are no filling defects in the central, lobar, segmental or subsegmental pulmonary artery branches to suggest acute pulmonary embolism. Great vessels are normal in course and caliber. Normal heart size. No significant pericardial fluid/thickening. Coronary artery calcifications. Mediastinum/Nodes: Imaged thyroid gland without nodules meeting criteria for imaging follow-up by size. Normal esophagus. No pathologically enlarged axillary, supraclavicular, mediastinal, or hilar lymph nodes. Lungs/Pleura: The central airways are patent. Increased peribronchial wall thickening, most notable in the right lower lobe with scattered areas of subsegmental mucous plugging in the bilateral lower lobes. New irregular bilateral lower lobe consolidations on a background of bilateral lower lobe scarring/atelectasis. Unchanged severe centrilobular and paraseptal emphysema. Similar appearance of right perihilar and infrahilar postradiation changes. Decreased size of irregular right apical 1.6 x 1.4 cm nodule (5:27), previously 2.0 x 1.2 cm. No pneumothorax. Small right pleural effusion. Musculoskeletal: No acute or abnormal lytic or blastic osseous lesions. Multilevel degenerative changes of the thoracic spine.  Review of the MIP images confirms the above findings. CT ABDOMEN and PELVIS FINDINGS Somewhat suboptimal timing of contrast bolus. Hepatobiliary: No focal hepatic lesions. Mild  intra and extrahepatic bile duct dilation, likely secondary to cholecystectomy. Pancreas: No focal lesions or main ductal dilation. Spleen: Normal in size without focal abnormality. Adrenals/Urinary Tract: No adrenal nodules. No suspicious renal mass, calculi, or hydronephrosis. No focal bladder wall thickening. Stomach/Bowel: Normal appearance of the stomach. No evidence of bowel wall thickening, distention, or inflammatory changes. Colonic diverticulosis without acute diverticulitis. Appendix is not discretely seen. Vascular/Lymphatic: Aortic atherosclerosis. No enlarged abdominal or pelvic lymph nodes. Reproductive: Mildly lobulated uterus with multifocal coarse calcifications, likely leiomyomata. No definite adnexal masses. Other: No free fluid, fluid collection, or free air. Musculoskeletal: No acute or abnormal lytic or blastic osseous lesions. Multilevel degenerative changes of the lumbar spine. IMPRESSION: 1. No evidence of pulmonary embolism. 2. Increased peribronchial wall thickening, most notable in the right lower lobe with scattered areas of subsegmental mucous plugging in the bilateral lower lobes, likely bronchitis. 3. New irregular bilateral lower lobe consolidations on a background of bilateral lower lobe scarring/atelectasis, suspicious for pneumonia. 4. Small right pleural effusion. 5. Slightly decreased size of irregular right apical nodule. 6. No acute findings in the abdomen or pelvis. 7. Colonic diverticulosis without acute diverticulitis. 8. Aortic Atherosclerosis (ICD10-I70.0) and Emphysema (ICD10-J43.9). Electronically Signed   By: Agustin Cree M.D.   On: 05/28/2023 12:05   DG Chest 2 View Result Date: 05/28/2023 CLINICAL DATA:  Shortness of breath. EXAM: CHEST - 2 VIEW COMPARISON:  Chest radiograph dated 02/23/2023.  CT chest dated 03/31/2023. FINDINGS: Stable cardiomediastinal silhouette. Aortic atherosclerosis. Stable right chest Port-A-Cath tip overlies the superior cavoatrial junction. Similar radiation changes are again noted in the right perihilar region. The spiculated nodule in the right upper lobe is not well visualized on this exam. Small right pleural effusion with basilar atelectasis. Streaky opacities at the right lung base. Hyperinflation with similar diffuse coarsened interstitial markings. No pneumothorax. No acute osseous abnormality. IMPRESSION: 1. Small right pleural effusion with basilar atelectasis. Streaky opacities at the right lung base could reflect atelectasis/scarring, however, infiltrate can not be excluded. 2. Emphysema. Electronically Signed   By: Hart Robinsons M.D.   On: 05/28/2023 10:37    Scheduled Meds:  arformoterol  15 mcg Nebulization BID   budesonide (PULMICORT) nebulizer solution  0.25 mg Nebulization BID   Chlorhexidine Gluconate Cloth  6 each Topical Q0600   DULoxetine  60 mg Oral BID   feeding supplement  237 mL Oral TID BM   ipratropium-albuterol  3 mL Nebulization Q6H   methylPREDNISolone (SOLU-MEDROL) injection  60 mg Intravenous Q12H   pantoprazole (PROTONIX) IV  40 mg Intravenous Q12H   tamsulosin  0.4 mg Oral QPC supper   Continuous Infusions:  azithromycin     cefTRIAXone (ROCEPHIN)  IV      LOS: 1 day   Critical Care Procedure Note Authorized and Performed by: Maryln Manuel MD  Total Critical Care time:  57 mins  Due to a high probability of clinically significant, life threatening deterioration, the patient required my highest level of preparedness to intervene emergently and I personally spent this critical care time directly and personally managing the patient.  This critical care time included obtaining a history; examining the patient, pulse oximetry; ordering and review of studies; arranging urgent treatment with development of a management plan;  evaluation of patient's response of treatment; frequent reassessment; and discussions with other providers.  This critical care time was performed to assess and manage the high probability of imminent and life threatening deterioration that could result in multi-organ failure.  It was exclusive  of separately billable procedures and treating other patients and teaching time.   Standley Dakins, MD How to contact the [facility] Attending or Consulting provider 7A - 7P or covering provider during after hours 7P -7A, for this patient?  Check the care team in [facility] and look for a) attending/consulting [facility] provider listed and b) the [facility] team listed Log into [email or web address] to find provider on call.  Locate the [facility] provider you are looking for under [facility] and page to a number that you can be directly reached. If you still have difficulty reaching the provider, please page the [facility] (Director on Call) for the Hospitalists listed on amion for assistance.  05/29/2023, 8:57 AM

## 2023-05-30 DIAGNOSIS — D649 Anemia, unspecified: Secondary | ICD-10-CM | POA: Diagnosis not present

## 2023-05-30 DIAGNOSIS — C3491 Malignant neoplasm of unspecified part of right bronchus or lung: Secondary | ICD-10-CM | POA: Diagnosis not present

## 2023-05-30 DIAGNOSIS — Z791 Long term (current) use of non-steroidal anti-inflammatories (NSAID): Secondary | ICD-10-CM | POA: Diagnosis not present

## 2023-05-30 DIAGNOSIS — K921 Melena: Secondary | ICD-10-CM | POA: Diagnosis not present

## 2023-05-30 DIAGNOSIS — I5032 Chronic diastolic (congestive) heart failure: Secondary | ICD-10-CM

## 2023-05-30 DIAGNOSIS — J9621 Acute and chronic respiratory failure with hypoxia: Secondary | ICD-10-CM | POA: Diagnosis not present

## 2023-05-30 DIAGNOSIS — K219 Gastro-esophageal reflux disease without esophagitis: Secondary | ICD-10-CM

## 2023-05-30 LAB — CBC
HCT: 23.9 % — ABNORMAL LOW (ref 36.0–46.0)
Hemoglobin: 7.9 g/dL — ABNORMAL LOW (ref 12.0–15.0)
MCH: 29.3 pg (ref 26.0–34.0)
MCHC: 33.1 g/dL (ref 30.0–36.0)
MCV: 88.5 fL (ref 80.0–100.0)
Platelets: 319 10*3/uL (ref 150–400)
RBC: 2.7 MIL/uL — ABNORMAL LOW (ref 3.87–5.11)
RDW: 15.5 % (ref 11.5–15.5)
WBC: 8 10*3/uL (ref 4.0–10.5)
nRBC: 0 % (ref 0.0–0.2)

## 2023-05-30 LAB — RETICULOCYTES
Immature Retic Fract: 14.1 % (ref 2.3–15.9)
RBC.: 2.66 MIL/uL — ABNORMAL LOW (ref 3.87–5.11)
Retic Count, Absolute: 40.4 10*3/uL (ref 19.0–186.0)
Retic Ct Pct: 1.5 % (ref 0.4–3.1)

## 2023-05-30 LAB — IRON AND TIBC
Iron: 15 ug/dL — ABNORMAL LOW (ref 28–170)
Saturation Ratios: 6 % — ABNORMAL LOW (ref 10.4–31.8)
TIBC: 268 ug/dL (ref 250–450)
UIBC: 253 ug/dL

## 2023-05-30 LAB — BASIC METABOLIC PANEL
Anion gap: 9 (ref 5–15)
BUN: 24 mg/dL — ABNORMAL HIGH (ref 8–23)
CO2: 35 mmol/L — ABNORMAL HIGH (ref 22–32)
Calcium: 9 mg/dL (ref 8.9–10.3)
Chloride: 96 mmol/L — ABNORMAL LOW (ref 98–111)
Creatinine, Ser: 0.33 mg/dL — ABNORMAL LOW (ref 0.44–1.00)
GFR, Estimated: >60 mL/min (ref >60)
Glucose, Bld: 103 mg/dL — ABNORMAL HIGH (ref 70–99)
Potassium: 4 mmol/L (ref 3.5–5.1)
Sodium: 140 mmol/L (ref 135–145)

## 2023-05-30 LAB — FERRITIN: Ferritin: 12 ng/mL (ref 11–307)

## 2023-05-30 LAB — VITAMIN B12: Vitamin B-12: 502 pg/mL (ref 180–914)

## 2023-05-30 LAB — FOLATE: Folate: 11.8 ng/mL (ref >5.9)

## 2023-05-30 MED ORDER — SODIUM CHLORIDE 0.9 % IV SOLN
100.0000 mg | Freq: Once | INTRAVENOUS | Status: DC
  Filled 2023-05-30: qty 5

## 2023-05-30 NOTE — Progress Notes (Signed)
PROGRESS NOTE   Courtney Zuniga  ZOX:[id number] DOB: 1951-01-25 DOA: 05/28/2023 PCP: Oneal Grout, FNP   Chief Complaint  Patient presents with   Shortness of Breath   Level of care: Stepdown  Brief Admission History:  73-year-old female with COPD, chronic hypoxic respiratory failure, oxygen dependent on 4 L/min, history of recurrent non-small cell lung cancer, coronary artery disease history of NSTEMI, history of gastric ulcers, history of duodenal AVMs, currently on surveillance therapy for lung cancer after immunotherapy treatments, receiving CT scans every 4 months, reportedly has been having increasing shortness of breath symptoms over the past week that became acutely worse overnight.  She presented to the emergency department today with worsening shortness of breath symptoms and also reported lower abdominal pain.  She reports needing more than her baseline O2 requirement of 4 L/min had had increased it to 6 L/min had had oxygen saturation around 91%.  She reportedly was 87% on 4 L/min.  She was given bronchodilators in the emergency department with some improvement but not back to baseline.  She is reporting nonproductive cough at this time.  Influenza, RSV and SARS 2 coronavirus testing negative.  In the ED she received imaging studies with findings consistent with pneumonia.  She was also noted to have a drop in her baseline hemoglobin and reported having intermittent melanotic stools.  Patient was started on broad-spectrum antibiotic therapy in addition to scheduled bronchodilator therapy and steroids and admission requested.  GI was consulted and recommended patient remain n.p.o. until GI consultation and started on IV pantoprazole for GI protection.   Assessment and Plan:  Acute on chronic respiratory failure with hypoxia -Secondary to community-acquired pneumonia and COPD with acute exacerbation -Continue broad-spectrum antibiotic treatment as noted -Continue scheduled  bronchodilators and IV steroids -BiPAP as needed order in place and will monitor in stepdown ICU overnight -influenza, RSV, covid tests were negative    Community-acquired pneumonia -Continue azithromycin and ceftriaxone as ordered -Adding mucolytic's and cough suppressants -continue scheduled bronchodilators -follow blood cultures and sputum culture   COPD with acute exacerbation  -continue IV steroids and IV antibiotics -continue scheduled bronchodilators -continue mucolytics and cough suppressant -goal pulse ox is >88% given longstanding COPD and chronic hypoxia    Acute on chronic anemia -Hg down to 9 from baseline 11 -reporting melanotic stools -appreciate GI consultation and recommendations -IV pantoprazole for GI protection -type and screen, transfuse for Hg<7 -anemia panel pending    Stage IIIa non-small cell lung cancer - Followed closely by Dr. Shirline Frees with CT scanning every 4 months - Currently on observation therapy as she has completed immunotherapy treatments   Question of recurrent GI bleed History of Gastric Ulcer History of duodenal AVMs -appreciate GI consultation -pantoprazole ordered for GI protection -follow Hg closely  -Transfuse for hemoglobin less than 7   DVT prophylaxis:  SCDs Code Status: Full  Family Communication:  Disposition: TBD   Consultants:  GI  Procedures:   Antimicrobials:  CTX 2/13>> AZTH 2/13>>   Subjective: Remains very SOB.    Objective: Vitals:   05/30/23 0740 05/30/23 0800 05/30/23 0900 05/30/23 1142  BP:  126/88 (!) 140/109   Pulse:  (!) 105 (!) 113   Resp:  (!) 36 (!) 25   Temp: 98.6 F (37 C)   98.8 F (37.1 C)  TempSrc: Oral   Oral  SpO2: 99% 100% 98%   Weight:      Height:        Intake/Output Summary (Last 24 hours) at  05/30/2023 1307 Last data filed at 05/30/2023 0548 Gross per 24 hour  Intake 470.76 ml  Output 1400 ml  Net -929.24 ml   Filed Weights   05/28/23 0914 05/28/23 2021  Weight:  56.5 kg 55.6 kg   Examination:  General exam: Appears calm and comfortable, facial droop, left eye ptosis  Respiratory system: poor air movement, diffuse insp/exp wheezing heard.  Cardiovascular system: normal S1 & S2 heard. No JVD, murmurs, rubs, gallops or clicks. No pedal edema. Gastrointestinal system: Abdomen is nondistended, soft and nontender. No organomegaly or masses felt. Normal bowel sounds heard. Central nervous system: Alert and oriented. No focal neurological deficits. Extremities: Symmetric 5 x 5 power. Skin: No rashes, lesions or ulcers. Psychiatry: Judgement and insight appear normal. Mood & affect appropriate.   Data Reviewed: I have personally reviewed following labs and imaging studies  CBC: Recent Labs  Lab 05/28/23 0908 05/29/23 0516 05/30/23 0507  WBC 8.5 8.1 8.0  NEUTROABS 7.4 6.8  --   HGB 9.0* 8.2* 7.9*  HCT 28.1* 25.3* 23.9*  MCV 91.5 90.0 88.5  PLT 270 305 319    Basic Metabolic Panel: Recent Labs  Lab 05/28/23 0908 05/29/23 0516 05/30/23 0507  NA 142 142 140  K 3.9 4.0 4.0  CL 99 99 96*  CO2 34* 34* 35*  GLUCOSE 99 85 103*  BUN 11 21 24*  CREATININE 0.53 0.44 0.33*  CALCIUM 8.8* 8.5* 9.0  MG  --  1.8  --     CBG: No results for input(s): "GLUCAP" in the last 168 hours.  Recent Results (from the past 240 hours)  Resp panel by RT-PCR (RSV, Flu A&B, Covid) Anterior Nasal Swab     Status: None   Collection Time: 05/28/23  9:09 AM   Specimen: Anterior Nasal Swab  Result Value Ref Range Status   SARS Coronavirus 2 by RT PCR NEGATIVE NEGATIVE Final    Comment: (NOTE) SARS-CoV-2 target nucleic acids are NOT DETECTED.  The SARS-CoV-2 RNA is generally detectable in upper respiratory specimens during the acute phase of infection. The lowest concentration of SARS-CoV-2 viral copies this assay can detect is 138 copies/mL. A negative result does not preclude SARS-Cov-2 infection and should not be used as the sole basis for treatment  or other patient management decisions. A negative result may occur with  improper specimen collection/handling, submission of specimen other than nasopharyngeal swab, presence of viral mutation(s) within the areas targeted by this assay, and inadequate number of viral copies(<138 copies/mL). A negative result must be combined with clinical observations, patient history, and epidemiological information. The expected result is Negative.  Fact Sheet for Patients:  BloggerCourse.com  Fact Sheet for Healthcare Providers:  SeriousBroker.it  This test is no t yet approved or cleared by the [place] FDA and  has been authorized for detection and/or diagnosis of SARS-CoV-2 by FDA under an Emergency Use Authorization (EUA). This EUA will remain  in effect (meaning this test can be used) for the duration of the COVID-19 declaration under Section 564(b)(1) of the Act, 21 U.S.C.section 360bbb-3(b)(1), unless the authorization is terminated  or revoked sooner.       Influenza A by PCR NEGATIVE NEGATIVE Final   Influenza B by PCR NEGATIVE NEGATIVE Final    Comment: (NOTE) The Xpert Xpress SARS-CoV-2/FLU/RSV plus assay is intended as an aid in the diagnosis of influenza from Nasopharyngeal swab specimens and should not be used as a sole basis for treatment. Nasal washings and aspirates are unacceptable for  Xpert Xpress SARS-CoV-2/FLU/RSV testing.  Fact Sheet for Patients: BloggerCourse.com  Fact Sheet for Healthcare Providers: SeriousBroker.it  This test is not yet approved or cleared by the [place] FDA and has been authorized for detection and/or diagnosis of SARS-CoV-2 by FDA under an Emergency Use Authorization (EUA). This EUA will remain in effect (meaning this test can be used) for the duration of the COVID-19 declaration under Section 564(b)(1) of the Act, 21 U.S.C. section  360bbb-3(b)(1), unless the authorization is terminated or revoked.     Resp Syncytial Virus by PCR NEGATIVE NEGATIVE Final    Comment: (NOTE) Fact Sheet for Patients: BloggerCourse.com  Fact Sheet for Healthcare Providers: SeriousBroker.it  This test is not yet approved or cleared by the [place] FDA and has been authorized for detection and/or diagnosis of SARS-CoV-2 by FDA under an Emergency Use Authorization (EUA). This EUA will remain in effect (meaning this test can be used) for the duration of the COVID-19 declaration under Section 564(b)(1) of the Act, 21 U.S.C. section 360bbb-3(b)(1), unless the authorization is terminated or revoked.  Performed at [facility], [address] [place], [place] [address]   MRSA Next Gen by PCR, Nasal     Status: None   Collection Time: 05/28/23  8:50 PM   Specimen: Nasal Mucosa; Nasal Swab  Result Value Ref Range Status   MRSA by PCR Next Gen NOT DETECTED NOT DETECTED Final    Comment: (NOTE) The GeneXpert MRSA Assay (FDA approved for NASAL specimens only), is one component of a comprehensive MRSA colonization surveillance program. It is not intended to diagnose MRSA infection nor to guide or monitor treatment for MRSA infections. Test performance is not FDA approved in patients less than 76 years old. Performed at [facility], [address], [place], [place] [address]      Radiology Studies: No results found.   Scheduled Meds:  arformoterol  15 mcg Nebulization BID   budesonide (PULMICORT) nebulizer solution  0.25 mg Nebulization BID   Chlorhexidine Gluconate Cloth  6 each Topical Q0600   DULoxetine  60 mg Oral BID   feeding supplement  237 mL Oral TID BM   ipratropium-albuterol  3 mL Nebulization Q6H   methylPREDNISolone (SOLU-MEDROL) injection  60 mg Intravenous Q12H   pantoprazole (PROTONIX) IV  40 mg Intravenous Q12H   tamsulosin  0.4 mg Oral QPC supper   Continuous  Infusions:  azithromycin Stopped (05/29/23 1402)   cefTRIAXone (ROCEPHIN)  IV Stopped (05/29/23 1150)    LOS: 2 days   Critical Care Procedure Note Authorized and Performed by: Maryln Manuel MD  Total Critical Care time:  55 mins  Due to a high probability of clinically significant, life threatening deterioration, the patient required my highest level of preparedness to intervene emergently and I personally spent this critical care time directly and personally managing the patient.  This critical care time included obtaining a history; examining the patient, pulse oximetry; ordering and review of studies; arranging urgent treatment with development of a management plan; evaluation of patient's response of treatment; frequent reassessment; and discussions with other providers.  This critical care time was performed to assess and manage the high probability of imminent and life threatening deterioration that could result in multi-organ failure.  It was exclusive of separately billable procedures and treating other patients and teaching time.   Standley Dakins, MD How to contact the [facility] Attending or Consulting provider 7A - 7P or covering provider during after hours 7P -7A, for this patient?  Check  the care team in [facility] and look for a) attending/consulting TRH provider listed and b) the [facility] team listed Log into [email or web address] to find provider on call.  Locate the [facility] provider you are looking for under [facility] and page to a number that you can be directly reached. If you still have difficulty reaching the provider, please page the [facility] (Director on Call) for the Hospitalists listed on amion for assistance.  05/30/2023, 1:07 PM

## 2023-05-30 NOTE — Progress Notes (Addendum)
Gastroenterology & Hepatology   Interval History: Patient is seen in ICU today.  Does report frequent NSAID use for headaches.  Reports black color formed stools overnight  Inpatient Medications:  Current Facility-Administered Medications:    acetaminophen (TYLENOL) tablet 650 mg, 650 mg, Oral, Q6H PRN, 650 mg at 05/30/23 0610 **OR** acetaminophen (TYLENOL) suppository 650 mg, 650 mg, Rectal, Q6H PRN, Johnson, Clanford L, MD   arformoterol (BROVANA) nebulizer solution 15 mcg, 15 mcg, Nebulization, BID, Johnson, Clanford L, MD, 15 mcg at 05/30/23 0740   azithromycin (ZITHROMAX) 500 mg in sodium chloride 0.9 % 250 mL IVPB, 500 mg, Intravenous, Q24H, Johnson, Clanford L, MD, Last Rate: 250 mL/hr at 05/30/23 1538, 500 mg at 05/30/23 1538   budesonide (PULMICORT) nebulizer solution 0.25 mg, 0.25 mg, Nebulization, BID, Johnson, Clanford L, MD, 0.25 mg at 05/30/23 0740   cefTRIAXone (ROCEPHIN) 2 g in sodium chloride 0.9 % 100 mL IVPB, 2 g, Intravenous, Q24H, Johnson, Clanford L, MD, Last Rate: 200 mL/hr at 05/30/23 1406, 2 g at 05/30/23 1406   Chlorhexidine Gluconate Cloth 2 % PADS 6 each, 6 each, Topical, Q0600, Laural Benes, Clanford L, MD, 6 each at 05/30/23 [phone]   DULoxetine (CYMBALTA) DR capsule 60 mg, 60 mg, Oral, BID, Johnson, Clanford L, MD, 60 mg at 05/30/23 0850   feeding supplement (ENSURE ENLIVE / ENSURE PLUS) liquid 237 mL, 237 mL, Oral, TID BM, Johnson, Clanford L, MD, 237 mL at 05/30/23 1407   fentaNYL (SUBLIMAZE) injection 12.5 mcg, 12.5 mcg, Intravenous, Q2H PRN, Johnson, Clanford L, MD   ipratropium-albuterol (DUONEB) 0.5-2.5 (3) MG/3ML nebulizer solution 3 mL, 3 mL, Nebulization, Q6H, Johnson, Clanford L, MD, 3 mL at 05/30/23 1316   methylPREDNISolone sodium succinate (SOLU-MEDROL) 125 mg/2 mL injection 60 mg, 60 mg, Intravenous, Q12H, Johnson, Clanford L, MD, 60 mg at 05/30/23 0611   ondansetron (ZOFRAN) tablet 4 mg, 4 mg, Oral, Q6H PRN **OR** ondansetron (ZOFRAN) injection 4 mg, 4 mg,  Intravenous, Q6H PRN, Johnson, Clanford L, MD   oxyCODONE (Oxy IR/ROXICODONE) immediate release tablet 5 mg, 5 mg, Oral, Q4H PRN, Laural Benes, Clanford L, MD, 5 mg at 05/30/23 1536   pantoprazole (PROTONIX) injection 40 mg, 40 mg, Intravenous, Q12H, Johnson, Clanford L, MD, 40 mg at 05/30/23 0850   senna-docusate (Senokot-S) tablet 1 tablet, 1 tablet, Oral, QHS PRN, Laural Benes, Clanford L, MD   tamsulosin (FLOMAX) capsule 0.4 mg, 0.4 mg, Oral, QPC supper, Laural Benes, Clanford L, MD, 0.4 mg at 05/29/23 1729   I/O    Intake/Output Summary (Last 24 hours) at 05/30/2023 1714 Last data filed at 05/30/2023 0548 Gross per 24 hour  Intake 240 ml  Output 1400 ml  Net -1160 ml     Physical Exam: Temp:  [97.9 F (36.6 C)-99.1 F (37.3 C)] 98.9 F (37.2 C) (02/15 1543) Pulse Rate:  [37-117] 114 (02/15 1600) Resp:  [21-36] 33 (02/15 1600) BP: (115-158)/(51-120) 145/78 (02/15 1600) SpO2:  [94 %-100 %] 100 % (02/15 1600) FiO2 (%):  [35 %-40 %] 35 % (02/15 0148)  Temp (24hrs), Avg:98.6 F (37 C), Min:97.9 F (36.6 C), Max:99.1 F (37.3 C)  GENERAL: The patient is AO x3, in no acute distress. HEENT: Head is normocephalic and atraumatic. EOMI are intact. Mouth is well hydrated and without lesions. NECK: Supple. No masses LUNGS: Decreased breath sounds bilaterally HEART: RRR, normal s1 and s2. ABDOMEN: Soft, nontender, no guarding, no peritoneal signs, and nondistended. BS +. No masses.  Laboratory Data: CBC:     Component Value Date/Time  WBC 8.0 05/30/2023 0507   RBC 2.70 (L) 05/30/2023 0507   RBC 2.66 (L) 05/30/2023 0507   HGB 7.9 (L) 05/30/2023 0507   HGB 11.4 (L) 12/08/2022 0834   HGB 14.3 04/09/2017 0948   HCT 23.9 (L) 05/30/2023 0507   HCT 42.7 04/09/2017 0948   PLT 319 05/30/2023 0507   PLT 248 12/08/2022 0834   PLT 321 04/09/2017 0948   MCV 88.5 05/30/2023 0507   MCV 86.8 04/09/2017 0948   MCH 29.3 05/30/2023 0507   MCHC 33.1 05/30/2023 0507   RDW 15.5 05/30/2023 0507   RDW 15.9  (H) 04/09/2017 0948   LYMPHSABS 0.5 (L) 05/29/2023 0516   LYMPHSABS 1.9 04/09/2017 0948   MONOABS 0.7 05/29/2023 0516   MONOABS 0.6 04/09/2017 0948   EOSABS 0.0 05/29/2023 0516   EOSABS 0.1 04/09/2017 0948   BASOSABS 0.0 05/29/2023 0516   BASOSABS 0.1 04/09/2017 0948   COAG:  Lab Results  Component Value Date   INR 1.1 05/18/2022   INR 0.94 05/05/2018   INR 1.08 12/31/2015    BMP:     Latest Ref Rng & Units 05/30/2023    5:07 AM 05/29/2023    5:16 AM 05/28/2023    9:08 AM  BMP  Glucose 70 - 99 mg/dL 132  85  99   BUN 8 - 23 mg/dL 24  21  11    Creatinine 0.44 - 1.00 mg/dL 4.40  1.02  7.25   Sodium 135 - 145 mmol/L 140  142  142   Potassium 3.5 - 5.1 mmol/L 4.0  4.0  3.9   Chloride 98 - 111 mmol/L 96  99  99   CO2 22 - 32 mmol/L 35  34  34   Calcium 8.9 - 10.3 mg/dL 9.0  8.5  8.8     HEPATIC:     Latest Ref Rng & Units 05/28/2023    9:08 AM 12/08/2022    8:34 AM 07/16/2022    1:49 PM  Hepatic Function  Total Protein 6.5 - 8.1 g/dL 6.8  6.9  6.5   Albumin 3.5 - 5.0 g/dL 3.3  4.1  3.5   AST 15 - 41 U/L 18  18  16    ALT 0 - 44 U/L 12  9  10    Alk Phosphatase 38 - 126 U/L 185  256  222   Total Bilirubin 0.0 - 1.2 mg/dL 0.2  0.2  0.4     CARDIAC:  Lab Results  Component Value Date   TROPONINI <0.03 05/06/2018      Imaging: I personally reviewed and interpreted the available labs, imaging and endoscopic files.   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 Patient is admitted with respiratory failure. GI is consulted for concerns of melena with anemia   #Anemia #Concerns for melena   She had upper endoscopy performed last year found  to have clean-based ulcer and nonbleeding angiectasia which was treated with APC.  If she has melena could be likely due to peptic ulcer disease or other subsequent angiectasia    Upon further questioning patient has been taking significant ibuprofen again Advil and multiple aspirin powders for headaches.  She could probably have persistent peptic ulcer disease  given NSAID.    Depending on clinical status, if patient significant drop in hemoglobin or overt GI bleed may consider inpatient EGD otherwise can be treated conservatively with IV PPI and transfusion as patient remains high risk for the procedure given respiratory failure .  Consider IV Iron infusion while inpatient given IDA  Vista Lawman, MD Gastroenterology and Hepatology [facility]   This chart has been completed using [facility] Dictation software, and while attempts have been made to ensure accuracy , certain words and phrases may not be transcribed as intended

## 2023-05-30 NOTE — Progress Notes (Signed)
Off BIPAP on 5 liter hfnc

## 2023-05-31 DIAGNOSIS — J441 Chronic obstructive pulmonary disease with (acute) exacerbation: Secondary | ICD-10-CM | POA: Diagnosis not present

## 2023-05-31 DIAGNOSIS — I10 Essential (primary) hypertension: Secondary | ICD-10-CM

## 2023-05-31 DIAGNOSIS — J9621 Acute and chronic respiratory failure with hypoxia: Secondary | ICD-10-CM | POA: Diagnosis not present

## 2023-05-31 DIAGNOSIS — C3491 Malignant neoplasm of unspecified part of right bronchus or lung: Secondary | ICD-10-CM | POA: Diagnosis not present

## 2023-05-31 DIAGNOSIS — D649 Anemia, unspecified: Secondary | ICD-10-CM | POA: Diagnosis not present

## 2023-05-31 LAB — BASIC METABOLIC PANEL
Anion gap: 8 (ref 5–15)
BUN: 14 mg/dL (ref 8–23)
CO2: 35 mmol/L — ABNORMAL HIGH (ref 22–32)
Calcium: 9 mg/dL (ref 8.9–10.3)
Chloride: 97 mmol/L — ABNORMAL LOW (ref 98–111)
Creatinine, Ser: 0.36 mg/dL — ABNORMAL LOW (ref 0.44–1.00)
GFR, Estimated: >60 mL/min (ref >60)
Glucose, Bld: 133 mg/dL — ABNORMAL HIGH (ref 70–99)
Potassium: 3.9 mmol/L (ref 3.5–5.1)
Sodium: 140 mmol/L (ref 135–145)

## 2023-05-31 LAB — CBC
HCT: 26.3 % — ABNORMAL LOW (ref 36.0–46.0)
Hemoglobin: 8.7 g/dL — ABNORMAL LOW (ref 12.0–15.0)
MCH: 28.9 pg (ref 26.0–34.0)
MCHC: 33.1 g/dL (ref 30.0–36.0)
MCV: 87.4 fL (ref 80.0–100.0)
Platelets: 362 10*3/uL (ref 150–400)
RBC: 3.01 MIL/uL — ABNORMAL LOW (ref 3.87–5.11)
RDW: 15.8 % — ABNORMAL HIGH (ref 11.5–15.5)
WBC: 7.2 10*3/uL (ref 4.0–10.5)
nRBC: 0.3 % — ABNORMAL HIGH (ref 0.0–0.2)

## 2023-05-31 MED ORDER — FUROSEMIDE 10 MG/ML IJ SOLN
20.0000 mg | INTRAMUSCULAR | Status: DC
  Administered 2023-05-31 – 2023-06-02 (×2): 20 mg via INTRAVENOUS
  Filled 2023-05-31 (×3): qty 2

## 2023-05-31 MED ORDER — SODIUM CHLORIDE 0.9 % IV SOLN
100.0000 mg | Freq: Once | INTRAVENOUS | Status: AC
  Administered 2023-05-31: 100 mg via INTRAVENOUS
  Filled 2023-05-31: qty 5

## 2023-05-31 MED ORDER — METOPROLOL TARTRATE 5 MG/5ML IV SOLN
2.5000 mg | Freq: Four times a day (QID) | INTRAVENOUS | Status: DC
  Administered 2023-05-31 – 2023-06-03 (×12): 2.5 mg via INTRAVENOUS
  Filled 2023-05-31 (×9): qty 5

## 2023-05-31 NOTE — Progress Notes (Signed)
Patient requested to be on BIPAP now, as she feels short of breath, saturation 93% on 3 litres [place], this RN puts the patient back on BIPAP with previous settings, Fio2 35% with IPAP 12 and EPAP 6, rate 16, RT notified, will continue to monitor.

## 2023-05-31 NOTE — Plan of Care (Signed)
  Problem: Education: Goal: Knowledge of General Education information will improve Description Including pain rating scale, medication(s)/side effects and non-pharmacologic comfort measures Outcome: Progressing   Problem: Coping: Goal: Level of anxiety will decrease Outcome: Progressing   Problem: Elimination: Goal: Will not experience complications related to bowel motility Outcome: Progressing Goal: Will not experience complications related to urinary retention Outcome: Progressing   Problem: Skin Integrity: Goal: Risk for impaired skin integrity will decrease Outcome: Progressing   

## 2023-05-31 NOTE — Progress Notes (Signed)
PROGRESS NOTE   Courtney Zuniga  ZOX:[id number] DOB: 1950/08/21 DOA: 05/28/2023 PCP: Oneal Grout, FNP   Chief Complaint  Patient presents with   Shortness of Breath   Level of care: Stepdown  Brief Admission History:  73-year-old female with COPD, chronic hypoxic respiratory failure, oxygen dependent on 4 L/min, history of recurrent non-small cell lung cancer, coronary artery disease history of NSTEMI, history of gastric ulcers, history of duodenal AVMs, currently on surveillance therapy for lung cancer after immunotherapy treatments, receiving CT scans every 4 months, reportedly has been having increasing shortness of breath symptoms over the past week that became acutely worse overnight.  She presented to the emergency department today with worsening shortness of breath symptoms and also reported lower abdominal pain.  She reports needing more than her baseline O2 requirement of 4 L/min had had increased it to 6 L/min had had oxygen saturation around 91%.  She reportedly was 87% on 4 L/min.  She was given bronchodilators in the emergency department with some improvement but not back to baseline.  She is reporting nonproductive cough at this time.  Influenza, RSV and SARS 2 coronavirus testing negative.  In the ED she received imaging studies with findings consistent with pneumonia.  She was also noted to have a drop in her baseline hemoglobin and reported having intermittent melanotic stools.  Patient was started on broad-spectrum antibiotic therapy in addition to scheduled bronchodilator therapy and steroids and admission requested.  GI was consulted and recommended patient remain n.p.o. until GI consultation and started on IV pantoprazole for GI protection.   Assessment and Plan:  Acute on chronic respiratory failure with hypoxia -Secondary to community-acquired pneumonia and COPD with acute exacerbation -Continue broad-spectrum antibiotic treatment as noted -Continue scheduled  bronchodilators and IV steroids -BiPAP as needed and Q.H.S. order in place and will monitor in stepdown ICU -influenza, RSV, covid tests were negative  -pt remains HIGH RISK for intubation and likely will need home NIV if she remains full code, I requested a palliative care consultation for goals of care discussion.    Community-acquired pneumonia -Continue azithromycin and ceftriaxone as ordered -Adding mucolytics and cough suppressants -continue scheduled bronchodilators -follow blood cultures and sputum culture-NGTD   COPD with acute exacerbation  -continue IV steroids and IV antibiotics -continue scheduled bronchodilators -continue mucolytics and cough suppressant -goal pulse ox is >88% given longstanding COPD and chronic hypoxia    Acute on chronic anemia -Hg down to 9 from baseline 11 -reporting melanotic stools -appreciate GI consultation and recommendations -IV pantoprazole for GI protection -type and screen, transfuse for Hg<7 -anemia panel with finding of very low serum iron level -IV iron venofer given on 2/16 per GI recommendation   Stage IIIa non-small cell lung cancer - Followed closely by Dr. Shirline Frees with CT scanning every 4 months - Currently on observation therapy as she has completed immunotherapy treatments   Question of recurrent GI bleed History of Gastric Ulcer History of duodenal AVMs -appreciate GI consultation -pantoprazole ordered for GI protection -follow Hg closely  -Transfuse for hemoglobin less than 7  DVT prophylaxis:  SCDs Code Status: Full  Family Communication:  Disposition: TBD   Consultants:  GI  Procedures:   Antimicrobials:  CTX 2/13>> AZTH 2/13>>   Subjective: Pt still frequently requiring use of bipap.    Objective: Vitals:   05/31/23 1000 05/31/23 1100 05/31/23 1140 05/31/23 1200  BP: (!) 156/84 (!) 144/86  (!) 165/95  Pulse: (!) 116 (!) 116  (!) 119  Resp: Marland Kitchen)  27 (!) 27  (!) 36  Temp:   98.5 F (36.9 C)    TempSrc:   Oral   SpO2: 92% 98%  95%  Weight:      Height:        Intake/Output Summary (Last 24 hours) at 05/31/2023 1222 Last data filed at 05/31/2023 1217 Gross per 24 hour  Intake 567.77 ml  Output --  Net 567.77 ml   Filed Weights   05/28/23 0914 05/28/23 2021  Weight: 56.5 kg 55.6 kg   Examination:  General exam: Appears calm and comfortable, facial droop, left eye ptosis  Respiratory system: poor air movement, diffuse insp/exp wheezing heard.  Cardiovascular system: normal S1 & S2 heard. No JVD, murmurs, rubs, gallops or clicks. No pedal edema. Gastrointestinal system: Abdomen is nondistended, soft and nontender. No organomegaly or masses felt. Normal bowel sounds heard. Central nervous system: Alert and oriented. No focal neurological deficits. Extremities: Symmetric 5 x 5 power. Skin: No rashes, lesions or ulcers. Psychiatry: Judgement and insight appear normal. Mood & affect appropriate.   Data Reviewed: I have personally reviewed following labs and imaging studies  CBC: Recent Labs  Lab 05/28/23 0908 05/29/23 0516 05/30/23 0507 05/31/23 0546  WBC 8.5 8.1 8.0 7.2  NEUTROABS 7.4 6.8  --   --   HGB 9.0* 8.2* 7.9* 8.7*  HCT 28.1* 25.3* 23.9* 26.3*  MCV 91.5 90.0 88.5 87.4  PLT 270 305 319 362    Basic Metabolic Panel: Recent Labs  Lab 05/28/23 0908 05/29/23 0516 05/30/23 0507 05/31/23 0546  NA 142 142 140 140  K 3.9 4.0 4.0 3.9  CL 99 99 96* 97*  CO2 34* 34* 35* 35*  GLUCOSE 99 85 103* 133*  BUN 11 21 24* 14  CREATININE 0.53 0.44 0.33* 0.36*  CALCIUM 8.8* 8.5* 9.0 9.0  MG  --  1.8  --   --     CBG: No results for input(s): "GLUCAP" in the last 168 hours.  Recent Results (from the past 240 hours)  Resp panel by RT-PCR (RSV, Flu A&B, Covid) Anterior Nasal Swab     Status: None   Collection Time: 05/28/23  9:09 AM   Specimen: Anterior Nasal Swab  Result Value Ref Range Status   SARS Coronavirus 2 by RT PCR NEGATIVE NEGATIVE Final    Comment:  (NOTE) SARS-CoV-2 target nucleic acids are NOT DETECTED.  The SARS-CoV-2 RNA is generally detectable in upper respiratory specimens during the acute phase of infection. The lowest concentration of SARS-CoV-2 viral copies this assay can detect is 138 copies/mL. A negative result does not preclude SARS-Cov-2 infection and should not be used as the sole basis for treatment or other patient management decisions. A negative result may occur with  improper specimen collection/handling, submission of specimen other than nasopharyngeal swab, presence of viral mutation(s) within the areas targeted by this assay, and inadequate number of viral copies(<138 copies/mL). A negative result must be combined with clinical observations, patient history, and epidemiological information. The expected result is Negative.  Fact Sheet for Patients:  BloggerCourse.com  Fact Sheet for Healthcare Providers:  SeriousBroker.it  This test is no t yet approved or cleared by the [place] FDA and  has been authorized for detection and/or diagnosis of SARS-CoV-2 by FDA under an Emergency Use Authorization (EUA). This EUA will remain  in effect (meaning this test can be used) for the duration of the COVID-19 declaration under Section 564(b)(1) of the Act, 21 U.S.C.section 360bbb-3(b)(1), unless the authorization is  terminated  or revoked sooner.       Influenza A by PCR NEGATIVE NEGATIVE Final   Influenza B by PCR NEGATIVE NEGATIVE Final    Comment: (NOTE) The Xpert Xpress SARS-CoV-2/FLU/RSV plus assay is intended as an aid in the diagnosis of influenza from Nasopharyngeal swab specimens and should not be used as a sole basis for treatment. Nasal washings and aspirates are unacceptable for Xpert Xpress SARS-CoV-2/FLU/RSV testing.  Fact Sheet for Patients: BloggerCourse.com  Fact Sheet for Healthcare  Providers: SeriousBroker.it  This test is not yet approved or cleared by the [place] FDA and has been authorized for detection and/or diagnosis of SARS-CoV-2 by FDA under an Emergency Use Authorization (EUA). This EUA will remain in effect (meaning this test can be used) for the duration of the COVID-19 declaration under Section 564(b)(1) of the Act, 21 U.S.C. section 360bbb-3(b)(1), unless the authorization is terminated or revoked.     Resp Syncytial Virus by PCR NEGATIVE NEGATIVE Final    Comment: (NOTE) Fact Sheet for Patients: BloggerCourse.com  Fact Sheet for Healthcare Providers: SeriousBroker.it  This test is not yet approved or cleared by the [place] FDA and has been authorized for detection and/or diagnosis of SARS-CoV-2 by FDA under an Emergency Use Authorization (EUA). This EUA will remain in effect (meaning this test can be used) for the duration of the COVID-19 declaration under Section 564(b)(1) of the Act, 21 U.S.C. section 360bbb-3(b)(1), unless the authorization is terminated or revoked.  Performed at [facility], [address], [place], [place] [address]   MRSA Next Gen by PCR, Nasal     Status: None   Collection Time: 05/28/23  8:50 PM   Specimen: Nasal Mucosa; Nasal Swab  Result Value Ref Range Status   MRSA by PCR Next Gen NOT DETECTED NOT DETECTED Final    Comment: (NOTE) The GeneXpert MRSA Assay (FDA approved for NASAL specimens only), is one component of a comprehensive MRSA colonization surveillance program. It is not intended to diagnose MRSA infection nor to guide or monitor treatment for MRSA infections. Test performance is not FDA approved in patients less than 48 years old. Performed at [facility], [address], [place], [place] [address]      Radiology Studies: No results found.   Scheduled Meds:  arformoterol  15 mcg Nebulization BID    budesonide (PULMICORT) nebulizer solution  0.25 mg Nebulization BID   Chlorhexidine Gluconate Cloth  6 each Topical Q0600   DULoxetine  60 mg Oral BID   feeding supplement  237 mL Oral TID BM   furosemide  20 mg Intravenous QODAY   ipratropium-albuterol  3 mL Nebulization Q6H   methylPREDNISolone (SOLU-MEDROL) injection  60 mg Intravenous Q12H   pantoprazole (PROTONIX) IV  40 mg Intravenous Q12H   tamsulosin  0.4 mg Oral QPC supper   Continuous Infusions:  azithromycin Stopped (05/30/23 1641)   cefTRIAXone (ROCEPHIN)  IV Stopped (05/31/23 1211)    LOS: 3 days   Critical Care Procedure Note Authorized and Performed by: Maryln Manuel MD  Total Critical Care time: 57 mins  Due to a high probability of clinically significant, life threatening deterioration, the patient required my highest level of preparedness to intervene emergently and I personally spent this critical care time directly and personally managing the patient.  This critical care time included obtaining a history; examining the patient, pulse oximetry; ordering and review of studies; arranging urgent treatment with development of a management plan; evaluation of patient's response of treatment; frequent  reassessment; and discussions with other providers.  This critical care time was performed to assess and manage the high probability of imminent and life threatening deterioration that could result in multi-organ failure.  It was exclusive of separately billable procedures and treating other patients and teaching time.   Standley Dakins, MD How to contact the [facility] Attending or Consulting provider 7A - 7P or covering provider during after hours 7P -7A, for this patient?  Check the care team in [facility] and look for a) attending/consulting [facility] provider listed and b) the [facility] team listed Log into [email or web address] to find provider on call.  Locate the [facility] provider you are looking for under [facility] and page to a number that you can be  directly reached. If you still have difficulty reaching the provider, please page the [facility] (Director on Call) for the Hospitalists listed on amion for assistance.  05/31/2023, 12:22 PM

## 2023-05-31 NOTE — Plan of Care (Signed)

## 2023-06-01 ENCOUNTER — Encounter (HOSPITAL_COMMUNITY): Payer: Self-pay | Admitting: Family Medicine

## 2023-06-01 ENCOUNTER — Telehealth: Payer: Self-pay | Admitting: Gastroenterology

## 2023-06-01 DIAGNOSIS — Z7189 Other specified counseling: Secondary | ICD-10-CM | POA: Diagnosis not present

## 2023-06-01 DIAGNOSIS — I5032 Chronic diastolic (congestive) heart failure: Secondary | ICD-10-CM | POA: Diagnosis not present

## 2023-06-01 DIAGNOSIS — Z515 Encounter for palliative care: Secondary | ICD-10-CM

## 2023-06-01 DIAGNOSIS — J9621 Acute and chronic respiratory failure with hypoxia: Secondary | ICD-10-CM | POA: Diagnosis not present

## 2023-06-01 DIAGNOSIS — Z8711 Personal history of peptic ulcer disease: Secondary | ICD-10-CM | POA: Diagnosis not present

## 2023-06-01 DIAGNOSIS — Z720 Tobacco use: Secondary | ICD-10-CM

## 2023-06-01 DIAGNOSIS — K259 Gastric ulcer, unspecified as acute or chronic, without hemorrhage or perforation: Secondary | ICD-10-CM | POA: Diagnosis not present

## 2023-06-01 DIAGNOSIS — Z8719 Personal history of other diseases of the digestive system: Secondary | ICD-10-CM

## 2023-06-01 DIAGNOSIS — D649 Anemia, unspecified: Secondary | ICD-10-CM | POA: Diagnosis not present

## 2023-06-01 DIAGNOSIS — K219 Gastro-esophageal reflux disease without esophagitis: Secondary | ICD-10-CM | POA: Diagnosis not present

## 2023-06-01 LAB — BASIC METABOLIC PANEL
Anion gap: 6 (ref 5–15)
BUN: 19 mg/dL (ref 8–23)
CO2: 36 mmol/L — ABNORMAL HIGH (ref 22–32)
Calcium: 8.5 mg/dL — ABNORMAL LOW (ref 8.9–10.3)
Chloride: 98 mmol/L (ref 98–111)
Creatinine, Ser: 0.36 mg/dL — ABNORMAL LOW (ref 0.44–1.00)
GFR, Estimated: >60 mL/min (ref >60)
Glucose, Bld: 107 mg/dL — ABNORMAL HIGH (ref 70–99)
Potassium: 3.7 mmol/L (ref 3.5–5.1)
Sodium: 140 mmol/L (ref 135–145)

## 2023-06-01 LAB — CBC
HCT: 25.5 % — ABNORMAL LOW (ref 36.0–46.0)
Hemoglobin: 8.1 g/dL — ABNORMAL LOW (ref 12.0–15.0)
MCH: 27.7 pg (ref 26.0–34.0)
MCHC: 31.8 g/dL (ref 30.0–36.0)
MCV: 87.3 fL (ref 80.0–100.0)
Platelets: 364 10*3/uL (ref 150–400)
RBC: 2.92 MIL/uL — ABNORMAL LOW (ref 3.87–5.11)
RDW: 15.8 % — ABNORMAL HIGH (ref 11.5–15.5)
WBC: 6.9 10*3/uL (ref 4.0–10.5)
nRBC: 0.4 % — ABNORMAL HIGH (ref 0.0–0.2)

## 2023-06-01 MED ORDER — POTASSIUM CHLORIDE CRYS ER 20 MEQ PO TBCR
20.0000 meq | EXTENDED_RELEASE_TABLET | Freq: Once | ORAL | Status: AC
  Administered 2023-06-01: 20 meq via ORAL
  Filled 2023-06-01: qty 1

## 2023-06-01 NOTE — Consult Note (Signed)
Consultation Note Date: 06/01/2023   Patient Name: Courtney Zuniga  DOB: Oct 13, 1950  MRN: [id number]  Age / Sex: 73 y.o., female  PCP: Oneal Grout, FNP Referring Physician: Cleora Fleet, MD  Reason for Consultation: Establishing goals of care  HPI/Patient Profile: 73 y.o. female  with past medical history of COPD with chronic hypoxic respiratory failure on 4 L at home, history of recurrent "stable" non-small cell lung cancer on surveillance therapy after immunotherapy with CT scans every 4 months, CAD with a history of NSTEMI, history of gastric ulcers/duodenal AVMs, admitted on 05/28/2023 with acute on chronic respiratory failure with hypoxia with community-acquired pneumonia.   Clinical Assessment and Goals of Care: I have reviewed medical records including EPIC notes, labs and imaging, received report from RN, assessed the patient.  Courtney Zuniga is lying quietly in bed.  She appears acutely/chronically ill and somewhat frail.  She greets me, making but not keeping eye contact.  She is alert and oriented to person and month, but a little confused about situation.  It seems that her orientation waxes and wanes per nursing staff.  I do believe that she can make her basic needs known.  There is no family at bedside at this time.  Face-to-face discussion with bedside nursing staff related to patient condition, needs.  Call to daughter/HCPOA, Courtney Zuniga, to discuss diagnosis prognosis, GOC, EOL wishes, disposition and options.  No answer, voicemail message left.  Unable to have advanced directives discussions with patient's due to acuity of condition.  Per bedside nursing staff, daughter Courtney Zuniga works for hospice.  Anticipate fruitful discussion related to hospice and Palliative Care services outpatient.  Discussed the importance of continued conversation with family and the medical providers regarding  overall plan of care and treatment options, ensuring decisions are within the context of the patient's values and GOCs.  Questions and concerns were addressed.  The family was encouraged to call with questions or concerns.  PMT will continue to support holistically.  Conference with attending, bedside nursing staff, transition of care team related to patient condition, needs, goals of care, disposition.   HCPOA  NEXT OF KIN -Courtney Zuniga states that her daughter, Courtney Zuniga, is her healthcare surrogate.    SUMMARY OF RECOMMENDATIONS   Continue to treat the treatable Time for outcomes Anticipate home with home health if needed/qualified Needs NIV  Anticipate outpatient palliative    Code Status/Advance Care Planning: Full code - Unable to have advanced directives discussions with patient's due to acuity of condition.  Symptom Management:  Per hospitalist, no additional needs at this time.  Palliative Prophylaxis:  Frequent Pain Assessment, Oral Care, and Turn Reposition  Additional Recommendations (Limitations, Scope, Preferences): Full Scope Treatment  Psycho-social/Spiritual:  Desire for further Chaplaincy support:no Additional Recommendations: Caregiving  Support/Resources and Education on Hospice  Prognosis:  Unable to determine, based on outcomes.  Prognostication for COPD can be difficult.  Complicated by stable lung cancer.  Discharge Planning:  Anticipate home with home health if qualified/needed, anticipate outpatient palliative  Primary Diagnoses: Present on Admission:  Acute and chronic respiratory failure with hypoxia (HCC)  Sinus tachycardia  Severe protein-calorie malnutrition (HCC)  (HFpEF) heart failure with preserved ejection fraction (HCC)  Aortic regurgitation  Nicotine abuse  HTN (hypertension)  COPD with acute exacerbation (HCC)  CAP (community acquired pneumonia)  Adenocarcinoma of right lung, stage 4  GERD (gastroesophageal reflux  disease)  Acute on chronic anemia  History of Gastric ulcer  AVM (arteriovenous malformation) of small bowel, acquired   I have reviewed the medical record, interviewed the patient and family, and examined the patient. The following aspects are pertinent.  Past Medical History:  Diagnosis Date   Anemia    as a young woman   Arthritis    osteoartritis   Asthma    Brain tumor (benign) (HCC) 2005 [facility]   Benign   Chronic headaches    Chronic hip pain    Chronic pain    COPD (chronic obstructive pulmonary disease) (HCC)    Coronary artery disease    Depression    Depression 05/15/2016   Encounter for antineoplastic chemotherapy 01/10/2016   GERD (gastroesophageal reflux disease)    Hypertension    Lung cancer (HCC) dx'd 01/2016   currently on chemo and radiation    NSTEMI (non-ST elevated myocardial infarction) (HCC) yrs ago   On home O2    qhs 2 liters at hs and prn   Pneumonia last time 2 yrs ago   Shortness of breath dyspnea    with activity   Social History   Socioeconomic History   Marital status: Single    Spouse name: Not on file   Number of children: Not on file   Years of education: 10th grade   Highest education level: Not on file  Occupational History   Occupation: retired  Tobacco Use   Smoking status: Former    Current packs/day: 0.00    Average packs/day: 1 pack/day for 38.0 years (38.0 ttl pk-yrs)    Types: Cigarettes    Start date: 07/25/1980    Quit date: 07/26/2018    Years since quitting: 4.8   Smokeless tobacco: Never   Tobacco comments:    Pt has asked doctor for med to help   Vaping Use   Vaping status: Never Used  Substance and Sexual Activity   Alcohol use: No    Alcohol/week: 0.0 standard drinks of alcohol   Drug use: No   Sexual activity: Not Currently  Other Topics Concern   Not on file  Social History Narrative   Not on file   Social Drivers of Health   Financial Resource Strain: Low Risk  (05/05/2018)   Overall Financial  Resource Strain (CARDIA)    Difficulty of Paying Living Expenses: Not very hard  Food Insecurity: Patient Unable To Answer (05/28/2023)   Hunger Vital Sign    Worried About Running Out of Food in the Last Year: Patient unable to answer    Ran Out of Food in the Last Year: Patient unable to answer  Transportation Needs: Patient Unable To Answer (05/28/2023)   PRAPARE - Transportation    Lack of Transportation (Medical): Patient unable to answer    Lack of Transportation (Non-Medical): Patient unable to answer  Physical Activity: Insufficiently Active (05/05/2018)   Exercise Vital Sign    Days of Exercise per Week: 5 days    Minutes of Exercise per Session: 10 min  Stress: No Stress Concern Present (05/05/2018)   [employer] of Occupational Health - Occupational  Stress Questionnaire    Feeling of Stress : Not at all  Social Connections: Patient Unable To Answer (05/28/2023)   Social Connection and Isolation Panel [NHANES]    Frequency of Communication with Friends and Family: Patient unable to answer    Frequency of Social Gatherings with Friends and Family: Patient unable to answer    Attends Religious Services: Patient unable to answer    Active Member of Clubs or Organizations: Patient unable to answer    Attends Banker Meetings: Patient unable to answer    Marital Status: Patient unable to answer   Family History  Problem Relation Age of Onset   Ovarian cancer Mother    Lung cancer Father    Brain cancer Sister        Half sister   Lung cancer Brother    Lung cancer Brother    Prostate cancer Brother    Colon cancer Neg Hx    Colon polyps Neg Hx    Scheduled Meds:  arformoterol  15 mcg Nebulization BID   budesonide (PULMICORT) nebulizer solution  0.25 mg Nebulization BID   Chlorhexidine Gluconate Cloth  6 each Topical Q0600   DULoxetine  60 mg Oral BID   feeding supplement  237 mL Oral TID BM   furosemide  20 mg Intravenous QODAY   ipratropium-albuterol   3 mL Nebulization Q6H   methylPREDNISolone (SOLU-MEDROL) injection  60 mg Intravenous Q12H   metoprolol tartrate  2.5 mg Intravenous Q6H   pantoprazole (PROTONIX) IV  40 mg Intravenous Q12H   tamsulosin  0.4 mg Oral QPC supper   Continuous Infusions: PRN Meds:.acetaminophen **OR** acetaminophen, fentaNYL (SUBLIMAZE) injection, ondansetron **OR** ondansetron (ZOFRAN) IV, oxyCODONE, senna-docusate Medications Prior to Admission:  Prior to Admission medications   Medication Sig Start Date End Date Taking? Authorizing Provider  acetaminophen (TYLENOL) 325 MG tablet Take 2 tablets (650 mg total) by mouth every 6 (six) hours as needed for mild pain, fever or headache. 05/08/18  Yes Emokpae, Courage, MD  albuterol (PROVENTIL) (2.5 MG/3ML) 0.083% nebulizer solution Take 3 mLs (2.5 mg total) by nebulization 3 (three) times daily as needed for shortness of breath or wheezing. 08/28/22  Yes Emokpae, Courage, MD  albuterol (VENTOLIN HFA) 108 (90 Base) MCG/ACT inhaler Inhale 2 puffs into the lungs every 4 (four) hours as needed for shortness of breath. 08/28/22  Yes Emokpae, Courage, MD  Aspirin-Acetaminophen-Caffeine (GOODYS EXTRA STRENGTH PO) Take 1 packet by mouth daily as needed (headaches).   Yes [provider]  benzonatate (TESSALON) 100 MG capsule Take 100 mg by mouth 3 (three) times daily as needed. 05/22/23  Yes [provider]  bisoprolol (ZEBETA) 5 MG tablet Take 0.5 tablets (2.5 mg total) by mouth daily. Patient taking differently: Take 2.5 mg by mouth daily as needed (high bp). 10/13/22  Yes Mallipeddi, Vishnu P, MD  BREO ELLIPTA 200-25 MCG/ACT AEPB Inhale 1 puff into the lungs daily. 01/21/23  Yes [provider]  Docusate Sodium (DSS) 100 MG CAPS Take 1 capsule by mouth daily as needed (constipation). 10/14/22  Yes [provider]  DULoxetine (CYMBALTA) 60 MG capsule Take 1 capsule (60 mg total) by mouth 2 (two) times daily. 08/28/22  Yes Emokpae, Courage, MD  ENSURE  (ENSURE) Take 237 mLs by mouth 3 (three) times daily between meals.   Yes [provider]  FeFum-FePoly-FA-B Cmp-C-Biot (INTEGRA PLUS) CAPS Take 1 capsule by mouth every morning. 08/28/22  Yes Shon Hale, MD  furosemide (LASIX) 20 MG tablet Take  1 tablet (20 mg total) by mouth daily as needed (3 pound weight gain). 10/08/22  Yes Tat, Onalee Hua, MD  Ipratropium-Albuterol (COMBIVENT RESPIMAT) 20-100 MCG/ACT AERS respimat Inhale 1 puff into the lungs every 6 (six) hours as needed for wheezing or shortness of breath. 08/28/22  Yes Emokpae, Courage, MD  Multiple Vitamins-Minerals (MULTIVITAMIN WITH MINERALS) tablet Take 1 tablet by mouth daily. 08/28/22 08/28/23 Yes Emokpae, Courage, MD  oxyCODONE-acetaminophen (PERCOCET/ROXICET) 5-325 MG tablet Take 1 tablet by mouth every 8 (eight) hours as needed for moderate pain. 05/08/18  Yes Emokpae, Courage, MD  pantoprazole (PROTONIX) 40 MG tablet Take 1 tablet (40 mg total) by mouth 2 (two) times daily. 08/28/22 08/28/23 Yes Emokpae, Courage, MD  tamsulosin (FLOMAX) 0.4 MG CAPS capsule Take 1 capsule (0.4 mg total) by mouth daily after supper. 08/28/22  Yes Emokpae, Courage, MD  traZODone (DESYREL) 50 MG tablet Take 1 tablet (50 mg total) by mouth at bedtime. 08/28/22  Yes Emokpae, Courage, MD  Vitamin D, Ergocalciferol, (DRISDOL) 1.25 MG (50000 UNIT) CAPS capsule Take 1 capsule (50,000 Units total) by mouth once a week. 08/28/22  Yes Emokpae, Courage, MD  HYDROcodone bit-homatropine (HYCODAN) 5-1.5 MG/5ML syrup Take 5 mLs by mouth every 6 (six) hours as needed for cough. 11/10/21   Si Gaul, MD   Allergies  Allergen Reactions   Desyrel [Trazodone] Other (See Comments)    "Made me faint"   Review of Systems  Unable to perform ROS: Acuity of condition    Physical Exam Vitals and nursing note reviewed.  Constitutional:      General: She is not in acute distress.    Appearance: She is ill-appearing.  Cardiovascular:     Rate and Rhythm: Normal  rate.  Pulmonary:     Effort: Pulmonary effort is normal. No tachypnea.  Skin:    General: Skin is warm and dry.  Neurological:     Mental Status: She is alert.     Comments: Oriented to person and time, a little confused about place  Psychiatric:        Mood and Affect: Mood normal.        Behavior: Behavior normal.     Vital Signs: BP 114/74   Pulse 96   Temp 98.3 F (36.8 C) (Oral)   Resp (!) 25   Ht 5\' 7"  (1.702 m)   Wt 55.6 kg   SpO2 100%   BMI 19.20 kg/m  Pain Scale: 0-10   Pain Score: 0-No pain   SpO2: SpO2: 100 % O2 Device:SpO2: 100 % O2 Flow Rate: .O2 Flow Rate (L/min): 5 L/min  IO: Intake/output summary:  Intake/Output Summary (Last 24 hours) at 06/01/2023 1456 Last data filed at 06/01/2023 1412 Gross per 24 hour  Intake 827.1 ml  Output --  Net 827.1 ml    LBM: Last BM Date : 05/30/23 Baseline Weight: Weight: 56.5 kg Most recent weight: Weight: 55.6 kg     Palliative Assessment/Data:     Time In: 1115 Time Out: 1210 Time Total: 55 minutes  Greater than 50%  of this time was spent counseling and coordinating care related to the above assessment and plan.  Signed by: Katheran Awe, NP   Please contact Palliative Medicine Team phone at [phone] for questions and concerns.  For individual provider: See Loretha Stapler

## 2023-06-01 NOTE — Progress Notes (Signed)
Gastroenterology Progress Note     Patient ID: Courtney Zuniga; [id number]; January 18, 1951    Subjective   Denies abdominal pain, N/V. No overt GI bleeding per nursing staff. No melena or hematochezia. 3 liters nasal cannula.    Objective   Vital signs in last 24 hours Temp:  [98.1 F (36.7 C)-98.6 F (37 C)] 98.1 F (36.7 C) (02/17 0728) Pulse Rate:  [101-119] 107 (02/17 0000) Resp:  [15-36] 25 (02/17 0900) BP: (129-165)/(57-95) 148/57 (02/17 0500) SpO2:  [92 %-100 %] 98 % (02/17 0725) Last BM Date : 05/30/23  Physical Exam General:   Alert and oriented, pleasant Head:  Normocephalic and atraumatic. Abdomen:  Bowel sounds present, soft, non-tender, non-distended. No HSM or hernias noted. No rebound or guarding. No masses appreciated  Neurologic:  Alert and  oriented x4  Intake/Output from previous day: 02/16 0701 - 02/17 0700 In: 1048.6 [P.O.:240; IV Piggyback:808.6] Out: -  Intake/Output this shift: No intake/output data recorded.  Lab Results  Recent Labs    05/30/23 0507 05/31/23 0546 06/01/23 0443  WBC 8.0 7.2 6.9  HGB 7.9* 8.7* 8.1*  HCT 23.9* 26.3* 25.5*  PLT 319 362 364   BMET Recent Labs    05/30/23 0507 05/31/23 0546 06/01/23 0443  NA 140 140 140  K 4.0 3.9 3.7  CL 96* 97* 98  CO2 35* 35* 36*  GLUCOSE 103* 133* 107*  BUN 24* 14 19  CREATININE 0.33* 0.36* 0.36*  CALCIUM 9.0 9.0 8.5*     Studies/Results CT Angio Chest PE W/Cm &/Or Wo Cm Result Date: 05/28/2023 CLINICAL DATA:  History of non-small-cell lung cancer with shortness of breath and abdominal pain EXAM: CT ANGIOGRAPHY CHEST CT ABDOMEN AND PELVIS WITH CONTRAST TECHNIQUE: Multidetector CT imaging of the chest was performed using the standard protocol during bolus administration of intravenous contrast. Multiplanar CT image reconstructions and MIPs were obtained to evaluate the vascular anatomy. Multidetector CT imaging of the abdomen and pelvis was performed using the standard protocol  during bolus administration of intravenous contrast. RADIATION DOSE REDUCTION: This exam was performed according to the departmental dose-optimization program which includes automated exposure control, adjustment of the mA and/or kV according to patient size and/or use of iterative reconstruction technique. CONTRAST:  OMNIPAQUE IOHEXOL 350 MG/ML SOLN COMPARISON:  CT chest, abdomen, and pelvis dated 03/31/2023 FINDINGS: CTA CHEST FINDINGS Cardiovascular: Right chest wall port terminates in the right atrium. The study is high quality for the evaluation of pulmonary embolism. There are no filling defects in the central, lobar, segmental or subsegmental pulmonary artery branches to suggest acute pulmonary embolism. Great vessels are normal in course and caliber. Normal heart size. No significant pericardial fluid/thickening. Coronary artery calcifications. Mediastinum/Nodes: Imaged thyroid gland without nodules meeting criteria for imaging follow-up by size. Normal esophagus. No pathologically enlarged axillary, supraclavicular, mediastinal, or hilar lymph nodes. Lungs/Pleura: The central airways are patent. Increased peribronchial wall thickening, most notable in the right lower lobe with scattered areas of subsegmental mucous plugging in the bilateral lower lobes. New irregular bilateral lower lobe consolidations on a background of bilateral lower lobe scarring/atelectasis. Unchanged severe centrilobular and paraseptal emphysema. Similar appearance of right perihilar and infrahilar postradiation changes. Decreased size of irregular right apical 1.6 x 1.4 cm nodule (5:27), previously 2.0 x 1.2 cm. No pneumothorax. Small right pleural effusion. Musculoskeletal: No acute or abnormal lytic or blastic osseous lesions. Multilevel degenerative changes of the thoracic spine. Review of the MIP images confirms the above findings. CT ABDOMEN and PELVIS FINDINGS  Somewhat suboptimal timing of contrast bolus. Hepatobiliary:  No focal hepatic lesions. Mild intra and extrahepatic bile duct dilation, likely secondary to cholecystectomy. Pancreas: No focal lesions or main ductal dilation. Spleen: Normal in size without focal abnormality. Adrenals/Urinary Tract: No adrenal nodules. No suspicious renal mass, calculi, or hydronephrosis. No focal bladder wall thickening. Stomach/Bowel: Normal appearance of the stomach. No evidence of bowel wall thickening, distention, or inflammatory changes. Colonic diverticulosis without acute diverticulitis. Appendix is not discretely seen. Vascular/Lymphatic: Aortic atherosclerosis. No enlarged abdominal or pelvic lymph nodes. Reproductive: Mildly lobulated uterus with multifocal coarse calcifications, likely leiomyomata. No definite adnexal masses. Other: No free fluid, fluid collection, or free air. Musculoskeletal: No acute or abnormal lytic or blastic osseous lesions. Multilevel degenerative changes of the lumbar spine. IMPRESSION: 1. No evidence of pulmonary embolism. 2. Increased peribronchial wall thickening, most notable in the right lower lobe with scattered areas of subsegmental mucous plugging in the bilateral lower lobes, likely bronchitis. 3. New irregular bilateral lower lobe consolidations on a background of bilateral lower lobe scarring/atelectasis, suspicious for pneumonia. 4. Small right pleural effusion. 5. Slightly decreased size of irregular right apical nodule. 6. No acute findings in the abdomen or pelvis. 7. Colonic diverticulosis without acute diverticulitis. 8. Aortic Atherosclerosis (ICD10-I70.0) and Emphysema (ICD10-J43.9). Electronically Signed   By: Agustin Cree M.D.   On: 05/28/2023 12:05   CT ABDOMEN PELVIS W CONTRAST Result Date: 05/28/2023 CLINICAL DATA:  History of non-small-cell lung cancer with shortness of breath and abdominal pain EXAM: CT ANGIOGRAPHY CHEST CT ABDOMEN AND PELVIS WITH CONTRAST TECHNIQUE: Multidetector CT imaging of the chest was performed using the  standard protocol during bolus administration of intravenous contrast. Multiplanar CT image reconstructions and MIPs were obtained to evaluate the vascular anatomy. Multidetector CT imaging of the abdomen and pelvis was performed using the standard protocol during bolus administration of intravenous contrast. RADIATION DOSE REDUCTION: This exam was performed according to the departmental dose-optimization program which includes automated exposure control, adjustment of the mA and/or kV according to patient size and/or use of iterative reconstruction technique. CONTRAST:  OMNIPAQUE IOHEXOL 350 MG/ML SOLN COMPARISON:  CT chest, abdomen, and pelvis dated 03/31/2023 FINDINGS: CTA CHEST FINDINGS Cardiovascular: Right chest wall port terminates in the right atrium. The study is high quality for the evaluation of pulmonary embolism. There are no filling defects in the central, lobar, segmental or subsegmental pulmonary artery branches to suggest acute pulmonary embolism. Great vessels are normal in course and caliber. Normal heart size. No significant pericardial fluid/thickening. Coronary artery calcifications. Mediastinum/Nodes: Imaged thyroid gland without nodules meeting criteria for imaging follow-up by size. Normal esophagus. No pathologically enlarged axillary, supraclavicular, mediastinal, or hilar lymph nodes. Lungs/Pleura: The central airways are patent. Increased peribronchial wall thickening, most notable in the right lower lobe with scattered areas of subsegmental mucous plugging in the bilateral lower lobes. New irregular bilateral lower lobe consolidations on a background of bilateral lower lobe scarring/atelectasis. Unchanged severe centrilobular and paraseptal emphysema. Similar appearance of right perihilar and infrahilar postradiation changes. Decreased size of irregular right apical 1.6 x 1.4 cm nodule (5:27), previously 2.0 x 1.2 cm. No pneumothorax. Small right pleural effusion. Musculoskeletal:  No acute or abnormal lytic or blastic osseous lesions. Multilevel degenerative changes of the thoracic spine. Review of the MIP images confirms the above findings. CT ABDOMEN and PELVIS FINDINGS Somewhat suboptimal timing of contrast bolus. Hepatobiliary: No focal hepatic lesions. Mild intra and extrahepatic bile duct dilation, likely secondary to cholecystectomy. Pancreas: No focal lesions or main  ductal dilation. Spleen: Normal in size without focal abnormality. Adrenals/Urinary Tract: No adrenal nodules. No suspicious renal mass, calculi, or hydronephrosis. No focal bladder wall thickening. Stomach/Bowel: Normal appearance of the stomach. No evidence of bowel wall thickening, distention, or inflammatory changes. Colonic diverticulosis without acute diverticulitis. Appendix is not discretely seen. Vascular/Lymphatic: Aortic atherosclerosis. No enlarged abdominal or pelvic lymph nodes. Reproductive: Mildly lobulated uterus with multifocal coarse calcifications, likely leiomyomata. No definite adnexal masses. Other: No free fluid, fluid collection, or free air. Musculoskeletal: No acute or abnormal lytic or blastic osseous lesions. Multilevel degenerative changes of the lumbar spine. IMPRESSION: 1. No evidence of pulmonary embolism. 2. Increased peribronchial wall thickening, most notable in the right lower lobe with scattered areas of subsegmental mucous plugging in the bilateral lower lobes, likely bronchitis. 3. New irregular bilateral lower lobe consolidations on a background of bilateral lower lobe scarring/atelectasis, suspicious for pneumonia. 4. Small right pleural effusion. 5. Slightly decreased size of irregular right apical nodule. 6. No acute findings in the abdomen or pelvis. 7. Colonic diverticulosis without acute diverticulitis. 8. Aortic Atherosclerosis (ICD10-I70.0) and Emphysema (ICD10-J43.9). Electronically Signed   By: Agustin Cree M.D.   On: 05/28/2023 12:05   DG Chest 2 View Result Date:  05/28/2023 CLINICAL DATA:  Shortness of breath. EXAM: CHEST - 2 VIEW COMPARISON:  Chest radiograph dated 02/23/2023. CT chest dated 03/31/2023. FINDINGS: Stable cardiomediastinal silhouette. Aortic atherosclerosis. Stable right chest Port-A-Cath tip overlies the superior cavoatrial junction. Similar radiation changes are again noted in the right perihilar region. The spiculated nodule in the right upper lobe is not well visualized on this exam. Small right pleural effusion with basilar atelectasis. Streaky opacities at the right lung base. Hyperinflation with similar diffuse coarsened interstitial markings. No pneumothorax. No acute osseous abnormality. IMPRESSION: 1. Small right pleural effusion with basilar atelectasis. Streaky opacities at the right lung base could reflect atelectasis/scarring, however, infiltrate can not be excluded. 2. Emphysema. Electronically Signed   By: Hart Robinsons M.D.   On: 05/28/2023 10:37    Assessment  73 y.o. 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 GI consulted due to acute on chronic anemia and concerns for melena.    Acute on chronic anemia: Hgb 9 on admission, down from 12 in November 2024. Hgb 8.1 today, 8.7 yesterday. Overall similar 72 hours prior and without overt GI bleeding. No melena noted by nursing staff. 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 Overdue for surveillance.  Endorsing significant NSAID use prior to admission. Recommending outpatient EGD once she is improved from respiratory standpoint.  No need for urgent EGD while inpatient unless overt GI bleeding, worsening anemia.  GI will sign off. We can arrange outpatient follow-up, as she does need surveillance EGD. Known history of PUD and AVMs likely exacerbated by endorsed NSAID use.     Plan / Recommendations  BID PPI Avoidance of NSAIDs GI will arrange outpatient follow-up GI signing off: please call if any worsening anemia or overt bleeding.     LOS: 4 days    06/01/2023, 9:43 AM  Gelene Mink, PhD, [facility]

## 2023-06-01 NOTE — Progress Notes (Signed)
Bipap is PRN.  Not needed at this time. 

## 2023-06-01 NOTE — Telephone Encounter (Signed)
Please arrange outpatient hospital follow-up with me. Thanks!

## 2023-06-01 NOTE — Plan of Care (Signed)
  Problem: Coping: Goal: Level of anxiety will decrease Outcome: Progressing   Problem: Elimination: Goal: Will not experience complications related to urinary retention Outcome: Progressing   Problem: Pain Managment: Goal: General experience of comfort will improve and/or be controlled Outcome: Progressing   Problem: Safety: Goal: Ability to remain free from injury will improve Outcome: Progressing

## 2023-06-01 NOTE — Progress Notes (Signed)
PROGRESS NOTE   Courtney Zuniga  NWG:[id number] DOB: 04/25/1950 DOA: 05/28/2023 PCP: Oneal Grout, FNP   Chief Complaint  Patient presents with   Shortness of Breath   Level of care: Stepdown  Brief Admission History:  73-year-old female with COPD, chronic hypoxic respiratory failure, oxygen dependent on 4 L/min, history of recurrent non-small cell lung cancer, coronary artery disease history of NSTEMI, history of gastric ulcers, history of duodenal AVMs, currently on surveillance therapy for lung cancer after immunotherapy treatments, receiving CT scans every 4 months, reportedly has been having increasing shortness of breath symptoms over the past week that became acutely worse overnight.  She presented to the emergency department today with worsening shortness of breath symptoms and also reported lower abdominal pain.  She reports needing more than her baseline O2 requirement of 4 L/min had had increased it to 6 L/min had had oxygen saturation around 91%.  She reportedly was 87% on 4 L/min.  She was given bronchodilators in the emergency department with some improvement but not back to baseline.  She is reporting nonproductive cough at this time.  Influenza, RSV and SARS 2 coronavirus testing negative.  In the ED she received imaging studies with findings consistent with pneumonia.  She was also noted to have a drop in her baseline hemoglobin and reported having intermittent melanotic stools.  Patient was started on broad-spectrum antibiotic therapy in addition to scheduled bronchodilator therapy and steroids and admission requested.  GI was consulted and recommended patient remain n.p.o. until GI consultation and started on IV pantoprazole for GI protection.   Assessment and Plan:  Acute on chronic respiratory failure with hypoxia -Secondary to community-acquired pneumonia and COPD with acute exacerbation -Continue broad-spectrum antibiotic treatment as noted -Continue scheduled  bronchodilators and IV steroids -BiPAP as needed and Q.H.S. order in place and will monitor in stepdown ICU -influenza, RSV, covid tests were negative  -pt remains HIGH RISK for intubation and likely will need home NIV if she remains full code, I requested a palliative care consultation for goals of care discussion.    Community-acquired pneumonia -Continue azithromycin and ceftriaxone as ordered -Adding mucolytics and cough suppressants -continue scheduled bronchodilators -follow blood cultures and sputum culture-NGTD   COPD with acute exacerbation  -continue IV steroids and IV antibiotics -continue scheduled bronchodilators -continue mucolytics and cough suppressant -goal pulse ox is >88% given longstanding COPD and chronic hypoxia  --discussion with patient to consider home NIV considering she seems to be more dependent on bipap and she seems open to the idea and discussing. TOC consulted to assist.     Acute on chronic anemia -Hg down to 9 from baseline 11 -reporting melanotic stools -appreciate GI consultation and recommendations -IV pantoprazole for GI protection -type and screen, transfuse for Hg<7 -anemia panel with finding of very low serum iron level -IV iron venofer given on 2/16 per GI recommendation   Stage IIIa non-small cell lung cancer - Followed closely by Dr. Shirline Frees with CT scanning every 4 months - Currently on observation therapy as she has completed immunotherapy treatments   Question of recurrent GI bleed History of Gastric Ulcer History of duodenal AVMs -appreciate GI consultation - signed off, outpatient follow for EGD consideration -pantoprazole ordered for GI protection -follow Hg closely  -Transfuse for hemoglobin less than 7  DVT prophylaxis:  SCDs Code Status: Full  Family Communication:  Disposition: TBD pending palliative talks  Consultants:  GI  Palliative  Procedures:   Antimicrobials:  CTX 2/13>> AZTH 2/13>>  Subjective: Pt  still frequently requiring use of bipap.    Objective: Vitals:   06/01/23 1200 06/01/23 1300 06/01/23 1400 06/01/23 1500  BP: (!) 106/57 105/61 114/74 115/65  Pulse:  94 96 93  Resp: 16 (!) 25 (!) 25 (!) 29  Temp:      TempSrc:      SpO2:  100% 100% 100%  Weight:      Height:        Intake/Output Summary (Last 24 hours) at 06/01/2023 1545 Last data filed at 06/01/2023 1412 Gross per 24 hour  Intake 827.1 ml  Output --  Net 827.1 ml   Filed Weights   05/28/23 0914 05/28/23 2021  Weight: 56.5 kg 55.6 kg   Examination:  General exam: Appears calm and comfortable, facial droop, left eye ptosis  Respiratory system: poor air movement, diffuse insp/exp wheezing heard.  Cardiovascular system: normal S1 & S2 heard. No JVD, murmurs, rubs, gallops or clicks. No pedal edema. Gastrointestinal system: Abdomen is nondistended, soft and nontender. No organomegaly or masses felt. Normal bowel sounds heard. Central nervous system: Alert and oriented. No focal neurological deficits. Extremities: Symmetric 5 x 5 power. Skin: No rashes, lesions or ulcers. Psychiatry: Judgement and insight appear normal. Mood & affect appropriate.   Data Reviewed: I have personally reviewed following labs and imaging studies  CBC: Recent Labs  Lab 05/28/23 0908 05/29/23 0516 05/30/23 0507 05/31/23 0546 06/01/23 0443  WBC 8.5 8.1 8.0 7.2 6.9  NEUTROABS 7.4 6.8  --   --   --   HGB 9.0* 8.2* 7.9* 8.7* 8.1*  HCT 28.1* 25.3* 23.9* 26.3* 25.5*  MCV 91.5 90.0 88.5 87.4 87.3  PLT 270 305 319 362 364    Basic Metabolic Panel: Recent Labs  Lab 05/28/23 0908 05/29/23 0516 05/30/23 0507 05/31/23 0546 06/01/23 0443  NA 142 142 140 140 140  K 3.9 4.0 4.0 3.9 3.7  CL 99 99 96* 97* 98  CO2 34* 34* 35* 35* 36*  GLUCOSE 99 85 103* 133* 107*  BUN 11 21 24* 14 19  CREATININE 0.53 0.44 0.33* 0.36* 0.36*  CALCIUM 8.8* 8.5* 9.0 9.0 8.5*  MG  --  1.8  --   --   --     CBG: No results for input(s): "GLUCAP"  in the last 168 hours.  Recent Results (from the past 240 hours)  Resp panel by RT-PCR (RSV, Flu A&B, Covid) Anterior Nasal Swab     Status: None   Collection Time: 05/28/23  9:09 AM   Specimen: Anterior Nasal Swab  Result Value Ref Range Status   SARS Coronavirus 2 by RT PCR NEGATIVE NEGATIVE Final    Comment: (NOTE) SARS-CoV-2 target nucleic acids are NOT DETECTED.  The SARS-CoV-2 RNA is generally detectable in upper respiratory specimens during the acute phase of infection. The lowest concentration of SARS-CoV-2 viral copies this assay can detect is 138 copies/mL. A negative result does not preclude SARS-Cov-2 infection and should not be used as the sole basis for treatment or other patient management decisions. A negative result may occur with  improper specimen collection/handling, submission of specimen other than nasopharyngeal swab, presence of viral mutation(s) within the areas targeted by this assay, and inadequate number of viral copies(<138 copies/mL). A negative result must be combined with clinical observations, patient history, and epidemiological information. The expected result is Negative.  Fact Sheet for Patients:  BloggerCourse.com  Fact Sheet for Healthcare Providers:  SeriousBroker.it  This test is no t yet approved  or cleared by the [place] and  has been authorized for detection and/or diagnosis of SARS-CoV-2 by FDA under an Emergency Use Authorization (EUA). This EUA will remain  in effect (meaning this test can be used) for the duration of the COVID-19 declaration under Section 564(b)(1) of the Act, 21 U.S.C.section 360bbb-3(b)(1), unless the authorization is terminated  or revoked sooner.       Influenza A by PCR NEGATIVE NEGATIVE Final   Influenza B by PCR NEGATIVE NEGATIVE Final    Comment: (NOTE) The Xpert Xpress SARS-CoV-2/FLU/RSV plus assay is intended as an aid in the diagnosis of  influenza from Nasopharyngeal swab specimens and should not be used as a sole basis for treatment. Nasal washings and aspirates are unacceptable for Xpert Xpress SARS-CoV-2/FLU/RSV testing.  Fact Sheet for Patients: BloggerCourse.com  Fact Sheet for Healthcare Providers: SeriousBroker.it  This test is not yet approved or cleared by the [place] FDA and has been authorized for detection and/or diagnosis of SARS-CoV-2 by FDA under an Emergency Use Authorization (EUA). This EUA will remain in effect (meaning this test can be used) for the duration of the COVID-19 declaration under Section 564(b)(1) of the Act, 21 U.S.C. section 360bbb-3(b)(1), unless the authorization is terminated or revoked.     Resp Syncytial Virus by PCR NEGATIVE NEGATIVE Final    Comment: (NOTE) Fact Sheet for Patients: BloggerCourse.com  Fact Sheet for Healthcare Providers: SeriousBroker.it  This test is not yet approved or cleared by the [place] FDA and has been authorized for detection and/or diagnosis of SARS-CoV-2 by FDA under an Emergency Use Authorization (EUA). This EUA will remain in effect (meaning this test can be used) for the duration of the COVID-19 declaration under Section 564(b)(1) of the Act, 21 U.S.C. section 360bbb-3(b)(1), unless the authorization is terminated or revoked.  Performed at [facility], [address], [place], [place] [address]   MRSA Next Gen by PCR, Nasal     Status: None   Collection Time: 05/28/23  8:50 PM   Specimen: Nasal Mucosa; Nasal Swab  Result Value Ref Range Status   MRSA by PCR Next Gen NOT DETECTED NOT DETECTED Final    Comment: (NOTE) The GeneXpert MRSA Assay (FDA approved for NASAL specimens only), is one component of a comprehensive MRSA colonization surveillance program. It is not intended to diagnose MRSA infection nor to guide or monitor  treatment for MRSA infections. Test performance is not FDA approved in patients less than 71 years old. Performed at [facility], [address], [place], [place] [address]      Radiology Studies: No results found.   Scheduled Meds:  arformoterol  15 mcg Nebulization BID   budesonide (PULMICORT) nebulizer solution  0.25 mg Nebulization BID   Chlorhexidine Gluconate Cloth  6 each Topical Q0600   DULoxetine  60 mg Oral BID   feeding supplement  237 mL Oral TID BM   furosemide  20 mg Intravenous QODAY   ipratropium-albuterol  3 mL Nebulization Q6H   methylPREDNISolone (SOLU-MEDROL) injection  60 mg Intravenous Q12H   metoprolol tartrate  2.5 mg Intravenous Q6H   pantoprazole (PROTONIX) IV  40 mg Intravenous Q12H   tamsulosin  0.4 mg Oral QPC supper   Continuous Infusions:   LOS: 4 days   Critical Care Procedure Note Authorized and Performed by: Maryln Manuel MD  Total Critical Care time: 55 mins  Due to a high probability of clinically significant, life threatening deterioration, the patient required my highest level of  preparedness to intervene emergently and I personally spent this critical care time directly and personally managing the patient.  This critical care time included obtaining a history; examining the patient, pulse oximetry; ordering and review of studies; arranging urgent treatment with development of a management plan; evaluation of patient's response of treatment; frequent reassessment; and discussions with other providers.  This critical care time was performed to assess and manage the high probability of imminent and life threatening deterioration that could result in multi-organ failure.  It was exclusive of separately billable procedures and treating other patients and teaching time.   Standley Dakins, MD How to contact the [facility] Attending or Consulting provider 7A - 7P or covering provider during after hours 7P -7A, for this patient?  Check the care team in [facility] and  look for a) attending/consulting [facility] provider listed and b) the [facility] team listed Log into [email or web address] to find provider on call.  Locate the [facility] provider you are looking for under [facility] and page to a number that you can be directly reached. If you still have difficulty reaching the provider, please page the [facility] (Director on Call) for the Hospitalists listed on amion for assistance.  06/01/2023, 3:45 PM

## 2023-06-02 DIAGNOSIS — I5032 Chronic diastolic (congestive) heart failure: Secondary | ICD-10-CM | POA: Diagnosis not present

## 2023-06-02 DIAGNOSIS — R000 Tachycardia, unspecified: Secondary | ICD-10-CM

## 2023-06-02 DIAGNOSIS — C3491 Malignant neoplasm of unspecified part of right bronchus or lung: Secondary | ICD-10-CM | POA: Diagnosis not present

## 2023-06-02 DIAGNOSIS — J9621 Acute and chronic respiratory failure with hypoxia: Secondary | ICD-10-CM | POA: Diagnosis not present

## 2023-06-02 LAB — BASIC METABOLIC PANEL
Anion gap: 10 (ref 5–15)
BUN: 20 mg/dL (ref 8–23)
CO2: 36 mmol/L — ABNORMAL HIGH (ref 22–32)
Calcium: 8.9 mg/dL (ref 8.9–10.3)
Chloride: 95 mmol/L — ABNORMAL LOW (ref 98–111)
Creatinine, Ser: 0.42 mg/dL — ABNORMAL LOW (ref 0.44–1.00)
GFR, Estimated: >60 mL/min (ref >60)
Glucose, Bld: 101 mg/dL — ABNORMAL HIGH (ref 70–99)
Potassium: 4.2 mmol/L (ref 3.5–5.1)
Sodium: 141 mmol/L (ref 135–145)

## 2023-06-02 LAB — MAGNESIUM: Magnesium: 1.9 mg/dL (ref 1.7–2.4)

## 2023-06-02 MED ORDER — MAGNESIUM SULFATE IN D5W 1-5 GM/100ML-% IV SOLN
1.0000 g | Freq: Once | INTRAVENOUS | Status: AC
  Administered 2023-06-02: 1 g via INTRAVENOUS
  Filled 2023-06-02: qty 100

## 2023-06-02 NOTE — Progress Notes (Signed)
Patient had been on 5L [place] with sat of 100%.  Weaned down to 4L; sat still at 100%.  Will continue to wean as tolerated.

## 2023-06-02 NOTE — Progress Notes (Addendum)
PROGRESS NOTE   Courtney Zuniga  YQI:[id number] DOB: [birth year] DOA: 05/28/2023 PCP: Oneal Grout, FNP   Chief Complaint  Patient presents with   Shortness of Breath   Level of care: Stepdown  Brief Admission History:  73-year-old female with COPD, chronic hypoxic respiratory failure, oxygen dependent on 4 L/min, history of recurrent non-small cell lung cancer, coronary artery disease history of NSTEMI, history of gastric ulcers, history of duodenal AVMs, currently on surveillance therapy for lung cancer after immunotherapy treatments, receiving CT scans every 4 months, reportedly has been having increasing shortness of breath symptoms over the past week that became acutely worse overnight.  She presented to the emergency department today with worsening shortness of breath symptoms and also reported lower abdominal pain.  She reports needing more than her baseline O2 requirement of 4 L/min had had increased it to 6 L/min had had oxygen saturation around 91%.  She reportedly was 87% on 4 L/min.  She was given bronchodilators in the emergency department with some improvement but not back to baseline.  She is reporting nonproductive cough at this time.  Influenza, RSV and SARS 2 coronavirus testing negative.  In the ED she received imaging studies with findings consistent with pneumonia.  She was also noted to have a drop in her baseline hemoglobin and reported having intermittent melanotic stools.  Patient was started on broad-spectrum antibiotic therapy in addition to scheduled bronchodilator therapy and steroids and admission requested.  GI was consulted and recommended patient remain n.p.o. until GI consultation and started on IV pantoprazole for GI protection.   Assessment and Plan:  Acute on chronic respiratory failure with hypoxia -Secondary to community-acquired pneumonia and COPD with acute exacerbation -Continue broad-spectrum antibiotic treatment as noted -Continue scheduled  bronchodilators and IV steroids -BiPAP as needed and Q.H.S. order in place and will monitor in stepdown ICU -influenza, RSV, covid tests were negative  -pt remains HIGH RISK for intubation and likely will need home NIV which she is agreeable, I requested a palliative care consultation for goals of care discussion which is ongoing. -- pt continues to have frequent hospitalizations for acute on chronic respiratory failure. Ordering AVAPS-AE for CRF with Hypercapnia due to COPD/PNA. The addition of the auto-titrating feature is required for this hypercapnic patient to decrease the PaCO2 more efficiently and rapidly due to the severity of patient condition. Patient continues with persistent Hypercapnia with BIPAP use as prescribed during this and previous episodes of care. Volume Ventilation is indicated.  Ordering NIV with AVAPS-AE for use during hours of sleep as well as during waking hours when symptomatic. Home BIPAP/Home Bipap with VAPS is not appropriate for meeting this patient's ventilatory requirements due to the reasons below:   1. BIPAP-AVAPS flow capability is extremely limited (1/3 of what the NIV can achieve).  2. NIV with AVAPS-AE is preferred due to Auto Adjusting EPAP function allowing for continued upper airway patency throughout use.  3. NIV with AVAPS-AE has dual prescription modality allowing for seamless transition between modes when changes in the patient's clinical condition requires.   Community-acquired pneumonia -Continue azithromycin and ceftriaxone as ordered -Adding mucolytics and cough suppressants -continue scheduled bronchodilators -follow blood cultures and sputum culture-NGTD   COPD with acute exacerbation  -continue IV steroids and IV antibiotics -continue scheduled bronchodilators -continue mucolytics and cough suppressant -goal pulse ox is >88% given longstanding COPD and chronic hypoxia  --discussion with patient to consider home NIV considering she seems  to be more dependent on bipap and she seems  open to the idea and discussing. TOC consulted to assist with home NIV arrangement.     Acute on chronic anemia -Hg down to 9 from baseline 11 -reporting melanotic stools -appreciate GI consultation and recommendations -IV pantoprazole for GI protection -type and screen, transfuse for Hg<7 -anemia panel with finding of very low serum iron level -IV iron venofer given on 2/16 per GI recommendation   Stage IIIa non-small cell lung cancer - Followed closely by Dr. Shirline Frees with CT scanning every 4 months - Currently on observation therapy as she has completed immunotherapy treatments   Question of recurrent GI bleed History of Gastric Ulcer History of duodenal AVMs -appreciate GI consultation - signed off, outpatient follow for EGD consideration -pantoprazole ordered for GI protection -follow Hg closely -- Holding stable around 8 -Transfuse for hemoglobin less than 7  DVT prophylaxis:  SCDs Code Status: Full  Family Communication:  Disposition: TBD pending palliative talks, but anticipate home with [facility]  Consultants:  GI  Palliative   Procedures:   Antimicrobials:  CTX 2/13>>2/17 AZTH 2/13>>2/17   Subjective: Pt had to wear bipap for several hours last night.  She is now back on Nasal cannula.  She tells me today she is agreeable to have a home NIV because she now realizes how bad her lung disease is becoming.   Objective: Vitals:   06/02/23 0700 06/02/23 0740 06/02/23 0742 06/02/23 0800  BP: 112/66     Pulse: 92 95  (!) 104  Resp: (!) 30 16  (!) 26  Temp:    99.1 F (37.3 C)  TempSrc:    Oral  SpO2: 99% 99% 96% 99%  Weight:      Height:        Intake/Output Summary (Last 24 hours) at 06/02/2023 0924 Last data filed at 06/02/2023 0906 Gross per 24 hour  Intake 1743.6 ml  Output --  Net 1743.6 ml   Filed Weights   05/28/23 0914 05/28/23 2021 06/02/23 0300  Weight: 56.5 kg 55.6 kg 55.6 kg   Examination:  General  exam: Appears calm and comfortable, facial droop, left eye ptosis  Respiratory system: poor air movement, diffuse insp/exp wheezing heard.  Cardiovascular system: normal S1 & S2 heard. No JVD, murmurs, rubs, gallops or clicks. No pedal edema. Gastrointestinal system: Abdomen is nondistended, soft and nontender. No organomegaly or masses felt. Normal bowel sounds heard. Central nervous system: Alert and oriented. No focal neurological deficits. Extremities: Symmetric 5 x 5 power. Skin: No rashes, lesions or ulcers. Psychiatry: Judgement and insight appear normal. Mood & affect appropriate.   Data Reviewed: I have personally reviewed following labs and imaging studies  CBC: Recent Labs  Lab 05/28/23 0908 05/29/23 0516 05/30/23 0507 05/31/23 0546 06/01/23 0443  WBC 8.5 8.1 8.0 7.2 6.9  NEUTROABS 7.4 6.8  --   --   --   HGB 9.0* 8.2* 7.9* 8.7* 8.1*  HCT 28.1* 25.3* 23.9* 26.3* 25.5*  MCV 91.5 90.0 88.5 87.4 87.3  PLT 270 305 319 362 364    Basic Metabolic Panel: Recent Labs  Lab 05/29/23 0516 05/30/23 0507 05/31/23 0546 06/01/23 0443 06/02/23 0313  NA 142 140 140 140 141  K 4.0 4.0 3.9 3.7 4.2  CL 99 96* 97* 98 95*  CO2 34* 35* 35* 36* 36*  GLUCOSE 85 103* 133* 107* 101*  BUN 21 24* 14 19 20   CREATININE 0.44 0.33* 0.36* 0.36* 0.42*  CALCIUM 8.5* 9.0 9.0 8.5* 8.9  MG 1.8  --   --   --  1.9    CBG: No results for input(s): "GLUCAP" in the last 168 hours.  Recent Results (from the past 240 hours)  Resp panel by RT-PCR (RSV, Flu A&B, Covid) Anterior Nasal Swab     Status: None   Collection Time: 05/28/23  9:09 AM   Specimen: Anterior Nasal Swab  Result Value Ref Range Status   SARS Coronavirus 2 by RT PCR NEGATIVE NEGATIVE Final    Comment: (NOTE) SARS-CoV-2 target nucleic acids are NOT DETECTED.  The SARS-CoV-2 RNA is generally detectable in upper respiratory specimens during the acute phase of infection. The lowest concentration of SARS-CoV-2 viral copies this  assay can detect is 138 copies/mL. A negative result does not preclude SARS-Cov-2 infection and should not be used as the sole basis for treatment or other patient management decisions. A negative result may occur with  improper specimen collection/handling, submission of specimen other than nasopharyngeal swab, presence of viral mutation(s) within the areas targeted by this assay, and inadequate number of viral copies(<138 copies/mL). A negative result must be combined with clinical observations, patient history, and epidemiological information. The expected result is Negative.  Fact Sheet for Patients:  BloggerCourse.com  Fact Sheet for Healthcare Providers:  SeriousBroker.it  This test is no t yet approved or cleared by the [place] FDA and  has been authorized for detection and/or diagnosis of SARS-CoV-2 by FDA under an Emergency Use Authorization (EUA). This EUA will remain  in effect (meaning this test can be used) for the duration of the COVID-19 declaration under Section 564(b)(1) of the Act, 21 U.S.C.section 360bbb-3(b)(1), unless the authorization is terminated  or revoked sooner.       Influenza A by PCR NEGATIVE NEGATIVE Final   Influenza B by PCR NEGATIVE NEGATIVE Final    Comment: (NOTE) The Xpert Xpress SARS-CoV-2/FLU/RSV plus assay is intended as an aid in the diagnosis of influenza from Nasopharyngeal swab specimens and should not be used as a sole basis for treatment. Nasal washings and aspirates are unacceptable for Xpert Xpress SARS-CoV-2/FLU/RSV testing.  Fact Sheet for Patients: BloggerCourse.com  Fact Sheet for Healthcare Providers: SeriousBroker.it  This test is not yet approved or cleared by the [place] FDA and has been authorized for detection and/or diagnosis of SARS-CoV-2 by FDA under an Emergency Use Authorization (EUA). This EUA will  remain in effect (meaning this test can be used) for the duration of the COVID-19 declaration under Section 564(b)(1) of the Act, 21 U.S.C. section 360bbb-3(b)(1), unless the authorization is terminated or revoked.     Resp Syncytial Virus by PCR NEGATIVE NEGATIVE Final    Comment: (NOTE) Fact Sheet for Patients: BloggerCourse.com  Fact Sheet for Healthcare Providers: SeriousBroker.it  This test is not yet approved or cleared by the [place] FDA and has been authorized for detection and/or diagnosis of SARS-CoV-2 by FDA under an Emergency Use Authorization (EUA). This EUA will remain in effect (meaning this test can be used) for the duration of the COVID-19 declaration under Section 564(b)(1) of the Act, 21 U.S.C. section 360bbb-3(b)(1), unless the authorization is terminated or revoked.  Performed at [facility], [address] [place], [place] [address]   MRSA Next Gen by PCR, Nasal     Status: None   Collection Time: 05/28/23  8:50 PM   Specimen: Nasal Mucosa; Nasal Swab  Result Value Ref Range Status   MRSA by PCR Next Gen NOT DETECTED NOT DETECTED Final    Comment: (NOTE) The GeneXpert MRSA Assay (FDA approved for  NASAL specimens only), is one component of a comprehensive MRSA colonization surveillance program. It is not intended to diagnose MRSA infection nor to guide or monitor treatment for MRSA infections. Test performance is not FDA approved in patients less than 1 years old. Performed at [facility], [address], [place], [place] [address]      Radiology Studies: No results found.   Scheduled Meds:  arformoterol  15 mcg Nebulization BID   budesonide (PULMICORT) nebulizer solution  0.25 mg Nebulization BID   Chlorhexidine Gluconate Cloth  6 each Topical Q0600   DULoxetine  60 mg Oral BID   feeding supplement  237 mL Oral TID BM   furosemide  20 mg Intravenous QODAY   ipratropium-albuterol  3 mL  Nebulization Q6H   methylPREDNISolone (SOLU-MEDROL) injection  60 mg Intravenous Q12H   metoprolol tartrate  2.5 mg Intravenous Q6H   pantoprazole (PROTONIX) IV  40 mg Intravenous Q12H   tamsulosin  0.4 mg Oral QPC supper   Continuous Infusions:   LOS: 5 days   Critical Care Procedure Note Authorized and Performed by: Maryln Manuel MD  Total Critical Care time: 56 mins  Due to a high probability of clinically significant, life threatening deterioration, the patient required my highest level of preparedness to intervene emergently and I personally spent this critical care time directly and personally managing the patient.  This critical care time included obtaining a history; examining the patient, pulse oximetry; ordering and review of studies; arranging urgent treatment with development of a management plan; evaluation of patient's response of treatment; frequent reassessment; and discussions with other providers.  This critical care time was performed to assess and manage the high probability of imminent and life threatening deterioration that could result in multi-organ failure.  It was exclusive of separately billable procedures and treating other patients and teaching time.   Standley Dakins, MD How to contact the [facility] Attending or Consulting provider 7A - 7P or covering provider during after hours 7P -7A, for this patient?  Check the care team in [facility] and look for a) attending/consulting TRH provider listed and b) the [facility] team listed Log into [email or web address] to find provider on call.  Locate the [facility] provider you are looking for under [facility] and page to a number that you can be directly reached. If you still have difficulty reaching the provider, please page the [facility] (Director on Call) for the Hospitalists listed on amion for assistance.  06/02/2023, 9:24 AM

## 2023-06-02 NOTE — Progress Notes (Signed)
Ms. Warnick's NIF= -32cmH2O.

## 2023-06-02 NOTE — Progress Notes (Signed)
Spoke with RN about patient wearing Bipap for awhile, RN explained that patient continuously pulls the Bipap off despite order for patient to wear at bedtime.

## 2023-06-02 NOTE — Progress Notes (Addendum)
Patient came off Bipap after being on for several hours.  Placed back on 3L [place].

## 2023-06-02 NOTE — TOC Progression Note (Signed)
Transition of Care [facility]) - Progression Note    Patient Details  Name: Courtney Zuniga MRN: [id number] Date of Birth: May 09, 1950  [facility] CM/SW Contact  Leitha Bleak, RN Phone Number: 06/02/2023, 11:27 AM  Clinical Narrative:   MD requesting TOC start the process of getting the patient an NIV. CMS options reviewed. Referral sent to [facility] earlier. She is currently here getting labs and paperwork completed with MD to process the order.     Expected Discharge Plan: Home w Home Health Services Barriers to Discharge: Continued Medical Work up  Expected Discharge Plan and Services In-house Referral: Clinical Social Work Discharge Planning Services: CM Consult Post Acute Care Choice: Home Health Living arrangements for the past 2 months: Single Family Home                 DME Arranged: NIV   Date DME Agency Contacted: 06/02/23 Time DME Agency Contacted: 1026 Representative spoke with at DME Agency: Marylene Land with [employer]             Social Determinants of Health (SDOH) Interventions SDOH Screenings   Food Insecurity: Patient Unable To Answer (05/28/2023)  Housing: Patient Unable To Answer (05/28/2023)  Transportation Needs: Patient Unable To Answer (05/28/2023)  Utilities: Patient Unable To Answer (05/28/2023)  Financial Resource Strain: Low Risk  (05/05/2018)  Physical Activity: Insufficiently Active (05/05/2018)  Social Connections: Patient Unable To Answer (05/28/2023)  Stress: No Stress Concern Present (05/05/2018)  Tobacco Use: Medium Risk (06/01/2023)    Readmission Risk Interventions    05/29/2023    1:14 PM 08/26/2022   10:43 AM 05/19/2022   12:12 PM  Readmission Risk Prevention Plan  Transportation Screening Complete Complete Complete  HRI or Home Care Consult Complete  Complete  Social Work Consult for Recovery Care Planning/Counseling Complete  Complete  Palliative Care Screening Not Applicable  Not Applicable  Medication Review Furniture conservator/restorer) Complete Complete Complete  HRI or Home Care Consult  Complete   SW Recovery Care/Counseling Consult  Complete   Palliative Care Screening  Not Applicable   Skilled Nursing Facility  Not Applicable

## 2023-06-02 NOTE — Progress Notes (Signed)
Patient allowed me to place her on Bipap after treatment.  RN made aware.

## 2023-06-03 DIAGNOSIS — J9621 Acute and chronic respiratory failure with hypoxia: Secondary | ICD-10-CM | POA: Diagnosis not present

## 2023-06-03 DIAGNOSIS — J9622 Acute and chronic respiratory failure with hypercapnia: Secondary | ICD-10-CM

## 2023-06-03 DIAGNOSIS — J181 Lobar pneumonia, unspecified organism: Secondary | ICD-10-CM

## 2023-06-03 DIAGNOSIS — Z7189 Other specified counseling: Secondary | ICD-10-CM | POA: Diagnosis not present

## 2023-06-03 DIAGNOSIS — D649 Anemia, unspecified: Secondary | ICD-10-CM | POA: Diagnosis not present

## 2023-06-03 DIAGNOSIS — C3491 Malignant neoplasm of unspecified part of right bronchus or lung: Secondary | ICD-10-CM | POA: Diagnosis not present

## 2023-06-03 DIAGNOSIS — Z515 Encounter for palliative care: Secondary | ICD-10-CM | POA: Diagnosis not present

## 2023-06-03 LAB — CBC
HCT: 26.6 % — ABNORMAL LOW (ref 36.0–46.0)
Hemoglobin: 9 g/dL — ABNORMAL LOW (ref 12.0–15.0)
MCH: 28.9 pg (ref 26.0–34.0)
MCHC: 33.8 g/dL (ref 30.0–36.0)
MCV: 85.5 fL (ref 80.0–100.0)
Platelets: 423 10*3/uL — ABNORMAL HIGH (ref 150–400)
RBC: 3.11 MIL/uL — ABNORMAL LOW (ref 3.87–5.11)
RDW: 16 % — ABNORMAL HIGH (ref 11.5–15.5)
WBC: 7.3 10*3/uL (ref 4.0–10.5)
nRBC: 0 % (ref 0.0–0.2)

## 2023-06-03 LAB — BASIC METABOLIC PANEL
Anion gap: 9 (ref 5–15)
BUN: 19 mg/dL (ref 8–23)
CO2: 35 mmol/L — ABNORMAL HIGH (ref 22–32)
Calcium: 9 mg/dL (ref 8.9–10.3)
Chloride: 95 mmol/L — ABNORMAL LOW (ref 98–111)
Creatinine, Ser: 0.41 mg/dL — ABNORMAL LOW (ref 0.44–1.00)
GFR, Estimated: >60 mL/min (ref >60)
Glucose, Bld: 104 mg/dL — ABNORMAL HIGH (ref 70–99)
Potassium: 4.2 mmol/L (ref 3.5–5.1)
Sodium: 139 mmol/L (ref 135–145)

## 2023-06-03 LAB — MAGNESIUM: Magnesium: 1.9 mg/dL (ref 1.7–2.4)

## 2023-06-03 MED ORDER — PREDNISONE 20 MG PO TABS: 60.0000 mg | ORAL_TABLET | Freq: Every day | ORAL | Status: DC

## 2023-06-03 MED ORDER — HEPARIN SOD (PORK) LOCK FLUSH 100 UNIT/ML IV SOLN
500.0000 [IU] | Freq: Once | INTRAVENOUS | Status: AC
  Administered 2023-06-03: 500 [IU] via INTRAVENOUS
  Filled 2023-06-03: qty 5

## 2023-06-03 MED ORDER — BUDESONIDE 0.5 MG/2ML IN SUSP: 0.5000 mg | Freq: Two times a day (BID) | RESPIRATORY_TRACT | Status: DC

## 2023-06-03 MED ORDER — PREDNISONE 10 MG PO TABS: 60.0000 mg | ORAL_TABLET | Freq: Every day | ORAL | 0 refills | Status: DC

## 2023-06-03 NOTE — Plan of Care (Signed)

## 2023-06-03 NOTE — Care Management Important Message (Signed)
Important Message  Patient Details  Name: Courtney Zuniga MRN: [id number] Date of Birth: Apr 30, 1950   Important Message Given:  Yes - Medicare IM     Corey Harold 06/03/2023, 11:21 AM

## 2023-06-03 NOTE — Discharge Summary (Signed)
Physician Discharge Summary   Patient: Courtney Zuniga MRN: [id number] DOB: [birth year]  Admit date:     05/28/2023  Discharge date: 06/03/23  Discharge Physician: Onalee Hua Lynwood Kubisiak   PCP: Oneal Grout, FNP   Recommendations at discharge:   Please follow up with primary care provider within 1-2 weeks  Please repeat BMP and CBC in one week     Hospital Course: 73 year old female with COPD, chronic hypoxic respiratory failure, oxygen dependent on 4 L/min, history of recurrent non-small cell lung cancer, coronary artery disease history of NSTEMI, history of gastric ulcers, history of duodenal AVMs, currently on surveillance therapy for lung cancer after immunotherapy treatments, receiving CT scans every 4 months, reportedly has been having increasing shortness of breath symptoms over the past week that became acutely worse overnight.  She presented to the emergency department today with worsening shortness of breath symptoms and also reported lower abdominal pain.  She reports needing more than her baseline O2 requirement of 4 L/min had had increased it to 6 L/min had had oxygen saturation around 91%.  She reportedly was 87% on 4 L/min.  She was given bronchodilators in the emergency department with some improvement but not back to baseline.  She is reporting nonproductive cough at this time.  Influenza, RSV and SARS 2 coronavirus testing negative.  In the ED she received imaging studies with findings consistent with pneumonia.  CTA chest was negative for PE.  She was also noted to have a drop in her baseline hemoglobin and reported having intermittent melanotic stools.  Patient was started on broad-spectrum antibiotic therapy in addition to scheduled bronchodilator therapy and steroids and admission requested.  GI was consulted and recommended patient remain n.p.o. until GI consultation and started on IV pantoprazole for GI protection. After the initial drop in hemoglobin, her hemoglobin largely remained  stable in the 8 range.  They recommended PPI twice daily and will arrange outpatient follow-up.  They recommended endoscopy in the outpatient setting, unless the patient has overt GI bleed or worsening anemia.  Assessment and Plan: Acute on chronic respiratory failure with hypoxia and hypercarbia -Secondary to community-acquired pneumonia and COPD with acute exacerbation -finished 5 days ceftriaxone and azithro -Continue scheduled bronchodilators and IV steroids -BiPAP as needed and Q.H.S. order in place and will monitor in stepdown ICU -influenza, RSV, covid tests were negative  -pt remains HIGH RISK for intubation and likely will need home NIV which she is agreeable, I requested a palliative care consultation for goals of care discussion which is ongoing. -- pt continues to have frequent hospitalizations for acute on chronic respiratory failure. Ordering AVAPS-AE for CRF with Hypercapnia due to COPD/PNA. The addition of the auto-titrating feature is required for this hypercapnic patient to decrease the PaCO2 more efficiently and rapidly due to the severity of patient condition. Patient continues with persistent Hypercapnia with BIPAP use as prescribed during this and previous episodes of care. Volume Ventilation is indicated.   Ordering NIV with AVAPS-AE for use during hours of sleep as well as during waking hours when symptomatic. Home BIPAP/Home Bipap with VAPS is not appropriate for meeting this patient's ventilatory requirements due to the reasons below:    1. BIPAP-AVAPS flow capability is extremely limited (1/3 of what the NIV can achieve).   2. NIV with AVAPS-AE is preferred due to Auto Adjusting EPAP function allowing for continued upper airway patency throughout use.   3. NIV with AVAPS-AE has dual prescription modality allowing for seamless transition between modes when changes in  the patient's clinical condition requires.   Lobar pneumonia -finished azithromycin and  ceftriaxone -continue mucolytics  -continue scheduled bronchodilators -follow blood cultures and sputum culture-NGTD   COPD with acute exacerbation  -continue IV steroids>>d/c home with prednisone taper -continue scheduled duonebs -continue mucolytics -goal pulse ox is >88% given longstanding COPD and chronic hypoxia  --discussion with patient to consider home NIV considering she seems to be more dependent on bipap and she seems open to the idea and discussing. TOC consulted to assist with home NIV arrangement.   -continue brovana -increased pulmicort to 0.5 mg   Acute on chronic anemia -Hg down to 9 from baseline 11 -reporting melanotic stools -appreciate GI consultation and recommendations -IV pantoprazole  -type and screen, transfuse for Hg<7 -anemia panel with finding of very low serum iron level -IV iron venofer given on 2/16 per GI recommendation   Stage IIIa non-small cell lung cancer - Followed closely by Dr. Shirline Frees with CT scanning every 4 months -previously on chemo and nivolumab - Currently on observation as she has completed immunotherapy treatments -CT CAP--no signs of recurrence presently   Question of recurrent GI bleed History of Gastric Ulcer History of duodenal AVMs -appreciate GI consultation - signed off, outpatient follow for EGD consideration -pantoprazole ordered for GI protection -follow Hg closely -- Holding stable around 8 -Transfuse for hemoglobin less than 7       Consultants: none Procedures performed: none  Disposition: Home Diet recommendation:  Regular diet DISCHARGE MEDICATION: Allergies as of 06/03/2023       Reactions   Desyrel [trazodone] Other (See Comments)   "Made me faint"        Medication List     TAKE these medications    acetaminophen 325 MG tablet Commonly known as: TYLENOL Take 2 tablets (650 mg total) by mouth every 6 (six) hours as needed for mild pain, fever or headache.   albuterol 108 (90 Base)  MCG/ACT inhaler Commonly known as: VENTOLIN HFA Inhale 2 puffs into the lungs every 4 (four) hours as needed for shortness of breath.   albuterol (2.5 MG/3ML) 0.083% nebulizer solution Commonly known as: PROVENTIL Take 3 mLs (2.5 mg total) by nebulization 3 (three) times daily as needed for shortness of breath or wheezing.   benzonatate 100 MG capsule Commonly known as: TESSALON Take 100 mg by mouth 3 (three) times daily as needed.   bisoprolol 5 MG tablet Commonly known as: ZEBETA Take 0.5 tablets (2.5 mg total) by mouth daily. What changed:  when to take this reasons to take this   Breo Ellipta 200-25 MCG/ACT Aepb Generic drug: fluticasone furoate-vilanterol Inhale 1 puff into the lungs daily.   Combivent Respimat 20-100 MCG/ACT Aers respimat Generic drug: Ipratropium-Albuterol Inhale 1 puff into the lungs every 6 (six) hours as needed for wheezing or shortness of breath.   DSS 100 MG Caps Take 1 capsule by mouth daily as needed (constipation).   DULoxetine 60 MG capsule Commonly known as: CYMBALTA Take 1 capsule (60 mg total) by mouth 2 (two) times daily.   Ensure Take 237 mLs by mouth 3 (three) times daily between meals.   furosemide 20 MG tablet Commonly known as: Lasix Take 1 tablet (20 mg total) by mouth daily as needed (3 pound weight gain).   GOODYS EXTRA STRENGTH PO Take 1 packet by mouth daily as needed (headaches).   Integra Plus Caps Take 1 capsule by mouth every morning.   multivitamin with minerals tablet Take 1 tablet by mouth daily.  oxyCODONE-acetaminophen 5-325 MG tablet Commonly known as: PERCOCET/ROXICET Take 1 tablet by mouth every 8 (eight) hours as needed for moderate pain.   pantoprazole 40 MG tablet Commonly known as: PROTONIX Take 1 tablet (40 mg total) by mouth 2 (two) times daily.   predniSONE 10 MG tablet Commonly known as: DELTASONE Take 6 tablets (60 mg total) by mouth daily with breakfast. And decrease by one tablet  daily Start taking on: June 04, 2023   tamsulosin 0.4 MG Caps capsule Commonly known as: FLOMAX Take 1 capsule (0.4 mg total) by mouth daily after supper.   traZODone 50 MG tablet Commonly known as: DESYREL Take 1 tablet (50 mg total) by mouth at bedtime.   Vitamin D (Ergocalciferol) 1.25 MG (50000 UNIT) Caps capsule Commonly known as: DRISDOL Take 1 capsule (50,000 Units total) by mouth once a week.        Discharge Exam: Filed Weights   05/28/23 2021 06/02/23 0300 06/03/23 0300  Weight: 55.6 kg 55.6 kg 55.6 kg   HEENT:  [place]/AT, No thrush, no icterus CV:  RRR, no rub, no S3, no S4 Lung:  bibasilar rales.  Fine bibasilar wheeze Abd:  soft/+BS, NT Ext:  No edema, no lymphangitis, no synovitis, no rash   Condition at discharge: stable  The results of significant diagnostics from this hospitalization (including imaging, microbiology, ancillary and laboratory) are listed below for reference.   Imaging Studies: CT Angio Chest PE W/Cm &/Or Wo Cm Result Date: 05/28/2023 CLINICAL DATA:  History of non-small-cell lung cancer with shortness of breath and abdominal pain EXAM: CT ANGIOGRAPHY CHEST CT ABDOMEN AND PELVIS WITH CONTRAST TECHNIQUE: Multidetector CT imaging of the chest was performed using the standard protocol during bolus administration of intravenous contrast. Multiplanar CT image reconstructions and MIPs were obtained to evaluate the vascular anatomy. Multidetector CT imaging of the abdomen and pelvis was performed using the standard protocol during bolus administration of intravenous contrast. RADIATION DOSE REDUCTION: This exam was performed according to the departmental dose-optimization program which includes automated exposure control, adjustment of the mA and/or kV according to patient size and/or use of iterative reconstruction technique. CONTRAST:  OMNIPAQUE IOHEXOL 350 MG/ML SOLN COMPARISON:  CT chest, abdomen, and pelvis dated 03/31/2023 FINDINGS: CTA CHEST  FINDINGS Cardiovascular: Right chest wall port terminates in the right atrium. The study is high quality for the evaluation of pulmonary embolism. There are no filling defects in the central, lobar, segmental or subsegmental pulmonary artery branches to suggest acute pulmonary embolism. Great vessels are normal in course and caliber. Normal heart size. No significant pericardial fluid/thickening. Coronary artery calcifications. Mediastinum/Nodes: Imaged thyroid gland without nodules meeting criteria for imaging follow-up by size. Normal esophagus. No pathologically enlarged axillary, supraclavicular, mediastinal, or hilar lymph nodes. Lungs/Pleura: The central airways are patent. Increased peribronchial wall thickening, most notable in the right lower lobe with scattered areas of subsegmental mucous plugging in the bilateral lower lobes. New irregular bilateral lower lobe consolidations on a background of bilateral lower lobe scarring/atelectasis. Unchanged severe centrilobular and paraseptal emphysema. Similar appearance of right perihilar and infrahilar postradiation changes. Decreased size of irregular right apical 1.6 x 1.4 cm nodule (5:27), previously 2.0 x 1.2 cm. No pneumothorax. Small right pleural effusion. Musculoskeletal: No acute or abnormal lytic or blastic osseous lesions. Multilevel degenerative changes of the thoracic spine. Review of the MIP images confirms the above findings. CT ABDOMEN and PELVIS FINDINGS Somewhat suboptimal timing of contrast bolus. Hepatobiliary: No focal hepatic lesions. Mild intra and extrahepatic bile duct  dilation, likely secondary to cholecystectomy. Pancreas: No focal lesions or main ductal dilation. Spleen: Normal in size without focal abnormality. Adrenals/Urinary Tract: No adrenal nodules. No suspicious renal mass, calculi, or hydronephrosis. No focal bladder wall thickening. Stomach/Bowel: Normal appearance of the stomach. No evidence of bowel wall thickening,  distention, or inflammatory changes. Colonic diverticulosis without acute diverticulitis. Appendix is not discretely seen. Vascular/Lymphatic: Aortic atherosclerosis. No enlarged abdominal or pelvic lymph nodes. Reproductive: Mildly lobulated uterus with multifocal coarse calcifications, likely leiomyomata. No definite adnexal masses. Other: No free fluid, fluid collection, or free air. Musculoskeletal: No acute or abnormal lytic or blastic osseous lesions. Multilevel degenerative changes of the lumbar spine. IMPRESSION: 1. No evidence of pulmonary embolism. 2. Increased peribronchial wall thickening, most notable in the right lower lobe with scattered areas of subsegmental mucous plugging in the bilateral lower lobes, likely bronchitis. 3. New irregular bilateral lower lobe consolidations on a background of bilateral lower lobe scarring/atelectasis, suspicious for pneumonia. 4. Small right pleural effusion. 5. Slightly decreased size of irregular right apical nodule. 6. No acute findings in the abdomen or pelvis. 7. Colonic diverticulosis without acute diverticulitis. 8. Aortic Atherosclerosis (ICD10-I70.0) and Emphysema (ICD10-J43.9). Electronically Signed   By: Agustin Cree M.D.   On: 05/28/2023 12:05   CT ABDOMEN PELVIS W CONTRAST Result Date: 05/28/2023 CLINICAL DATA:  History of non-small-cell lung cancer with shortness of breath and abdominal pain EXAM: CT ANGIOGRAPHY CHEST CT ABDOMEN AND PELVIS WITH CONTRAST TECHNIQUE: Multidetector CT imaging of the chest was performed using the standard protocol during bolus administration of intravenous contrast. Multiplanar CT image reconstructions and MIPs were obtained to evaluate the vascular anatomy. Multidetector CT imaging of the abdomen and pelvis was performed using the standard protocol during bolus administration of intravenous contrast. RADIATION DOSE REDUCTION: This exam was performed according to the departmental dose-optimization program which includes  automated exposure control, adjustment of the mA and/or kV according to patient size and/or use of iterative reconstruction technique. CONTRAST:  OMNIPAQUE IOHEXOL 350 MG/ML SOLN COMPARISON:  CT chest, abdomen, and pelvis dated 03/31/2023 FINDINGS: CTA CHEST FINDINGS Cardiovascular: Right chest wall port terminates in the right atrium. The study is high quality for the evaluation of pulmonary embolism. There are no filling defects in the central, lobar, segmental or subsegmental pulmonary artery branches to suggest acute pulmonary embolism. Great vessels are normal in course and caliber. Normal heart size. No significant pericardial fluid/thickening. Coronary artery calcifications. Mediastinum/Nodes: Imaged thyroid gland without nodules meeting criteria for imaging follow-up by size. Normal esophagus. No pathologically enlarged axillary, supraclavicular, mediastinal, or hilar lymph nodes. Lungs/Pleura: The central airways are patent. Increased peribronchial wall thickening, most notable in the right lower lobe with scattered areas of subsegmental mucous plugging in the bilateral lower lobes. New irregular bilateral lower lobe consolidations on a background of bilateral lower lobe scarring/atelectasis. Unchanged severe centrilobular and paraseptal emphysema. Similar appearance of right perihilar and infrahilar postradiation changes. Decreased size of irregular right apical 1.6 x 1.4 cm nodule (5:27), previously 2.0 x 1.2 cm. No pneumothorax. Small right pleural effusion. Musculoskeletal: No acute or abnormal lytic or blastic osseous lesions. Multilevel degenerative changes of the thoracic spine. Review of the MIP images confirms the above findings. CT ABDOMEN and PELVIS FINDINGS Somewhat suboptimal timing of contrast bolus. Hepatobiliary: No focal hepatic lesions. Mild intra and extrahepatic bile duct dilation, likely secondary to cholecystectomy. Pancreas: No focal lesions or main ductal dilation. Spleen:  Normal in size without focal abnormality. Adrenals/Urinary Tract: No adrenal nodules. No suspicious renal  mass, calculi, or hydronephrosis. No focal bladder wall thickening. Stomach/Bowel: Normal appearance of the stomach. No evidence of bowel wall thickening, distention, or inflammatory changes. Colonic diverticulosis without acute diverticulitis. Appendix is not discretely seen. Vascular/Lymphatic: Aortic atherosclerosis. No enlarged abdominal or pelvic lymph nodes. Reproductive: Mildly lobulated uterus with multifocal coarse calcifications, likely leiomyomata. No definite adnexal masses. Other: No free fluid, fluid collection, or free air. Musculoskeletal: No acute or abnormal lytic or blastic osseous lesions. Multilevel degenerative changes of the lumbar spine. IMPRESSION: 1. No evidence of pulmonary embolism. 2. Increased peribronchial wall thickening, most notable in the right lower lobe with scattered areas of subsegmental mucous plugging in the bilateral lower lobes, likely bronchitis. 3. New irregular bilateral lower lobe consolidations on a background of bilateral lower lobe scarring/atelectasis, suspicious for pneumonia. 4. Small right pleural effusion. 5. Slightly decreased size of irregular right apical nodule. 6. No acute findings in the abdomen or pelvis. 7. Colonic diverticulosis without acute diverticulitis. 8. Aortic Atherosclerosis (ICD10-I70.0) and Emphysema (ICD10-J43.9). Electronically Signed   By: Agustin Cree M.D.   On: 05/28/2023 12:05   DG Chest 2 View Result Date: 05/28/2023 CLINICAL DATA:  Shortness of breath. EXAM: CHEST - 2 VIEW COMPARISON:  Chest radiograph dated 02/23/2023. CT chest dated 03/31/2023. FINDINGS: Stable cardiomediastinal silhouette. Aortic atherosclerosis. Stable right chest Port-A-Cath tip overlies the superior cavoatrial junction. Similar radiation changes are again noted in the right perihilar region. The spiculated nodule in the right upper lobe is not well  visualized on this exam. Small right pleural effusion with basilar atelectasis. Streaky opacities at the right lung base. Hyperinflation with similar diffuse coarsened interstitial markings. No pneumothorax. No acute osseous abnormality. IMPRESSION: 1. Small right pleural effusion with basilar atelectasis. Streaky opacities at the right lung base could reflect atelectasis/scarring, however, infiltrate can not be excluded. 2. Emphysema. Electronically Signed   By: Hart Robinsons M.D.   On: 05/28/2023 10:37    Microbiology: Results for orders placed or performed during the hospital encounter of 05/28/23  Resp panel by RT-PCR (RSV, Flu A&B, Covid) Anterior Nasal Swab     Status: None   Collection Time: 05/28/23  9:09 AM   Specimen: Anterior Nasal Swab  Result Value Ref Range Status   SARS Coronavirus 2 by RT PCR NEGATIVE NEGATIVE Final    Comment: (NOTE) SARS-CoV-2 target nucleic acids are NOT DETECTED.  The SARS-CoV-2 RNA is generally detectable in upper respiratory specimens during the acute phase of infection. The lowest concentration of SARS-CoV-2 viral copies this assay can detect is 138 copies/mL. A negative result does not preclude SARS-Cov-2 infection and should not be used as the sole basis for treatment or other patient management decisions. A negative result may occur with  improper specimen collection/handling, submission of specimen other than nasopharyngeal swab, presence of viral mutation(s) within the areas targeted by this assay, and inadequate number of viral copies(<138 copies/mL). A negative result must be combined with clinical observations, patient history, and epidemiological information. The expected result is Negative.  Fact Sheet for Patients:  BloggerCourse.com  Fact Sheet for Healthcare Providers:  SeriousBroker.it  This test is no t yet approved or cleared by the [place] FDA and  has been authorized  for detection and/or diagnosis of SARS-CoV-2 by FDA under an Emergency Use Authorization (EUA). This EUA will remain  in effect (meaning this test can be used) for the duration of the COVID-19 declaration under Section 564(b)(1) of the Act, 21 U.S.C.section 360bbb-3(b)(1), unless the authorization is terminated  or revoked  sooner.       Influenza A by PCR NEGATIVE NEGATIVE Final   Influenza B by PCR NEGATIVE NEGATIVE Final    Comment: (NOTE) The Xpert Xpress SARS-CoV-2/FLU/RSV plus assay is intended as an aid in the diagnosis of influenza from Nasopharyngeal swab specimens and should not be used as a sole basis for treatment. Nasal washings and aspirates are unacceptable for Xpert Xpress SARS-CoV-2/FLU/RSV testing.  Fact Sheet for Patients: BloggerCourse.com  Fact Sheet for Healthcare Providers: SeriousBroker.it  This test is not yet approved or cleared by the [place] FDA and has been authorized for detection and/or diagnosis of SARS-CoV-2 by FDA under an Emergency Use Authorization (EUA). This EUA will remain in effect (meaning this test can be used) for the duration of the COVID-19 declaration under Section 564(b)(1) of the Act, 21 U.S.C. section 360bbb-3(b)(1), unless the authorization is terminated or revoked.     Resp Syncytial Virus by PCR NEGATIVE NEGATIVE Final    Comment: (NOTE) Fact Sheet for Patients: BloggerCourse.com  Fact Sheet for Healthcare Providers: SeriousBroker.it  This test is not yet approved or cleared by the [place] FDA and has been authorized for detection and/or diagnosis of SARS-CoV-2 by FDA under an Emergency Use Authorization (EUA). This EUA will remain in effect (meaning this test can be used) for the duration of the COVID-19 declaration under Section 564(b)(1) of the Act, 21 U.S.C. section 360bbb-3(b)(1), unless the authorization is  terminated or revoked.  Performed at [facility], [address], [place], [place] [address]   MRSA Next Gen by PCR, Nasal     Status: None   Collection Time: 05/28/23  8:50 PM   Specimen: Nasal Mucosa; Nasal Swab  Result Value Ref Range Status   MRSA by PCR Next Gen NOT DETECTED NOT DETECTED Final    Comment: (NOTE) The GeneXpert MRSA Assay (FDA approved for NASAL specimens only), is one component of a comprehensive MRSA colonization surveillance program. It is not intended to diagnose MRSA infection nor to guide or monitor treatment for MRSA infections. Test performance is not FDA approved in patients less than 67 years old. Performed at [facility], [address], [place], [place] [address]     Labs: CBC: Recent Labs  Lab 05/28/23 [id number] 05/29/23 0516 05/30/23 0507 05/31/23 0546 06/01/23 0443 06/03/23 0430  WBC 8.5 8.1 8.0 7.2 6.9 7.3  NEUTROABS 7.4 6.8  --   --   --   --   HGB 9.0* 8.2* 7.9* 8.7* 8.1* 9.0*  HCT 28.1* 25.3* 23.9* 26.3* 25.5* 26.6*  MCV 91.5 90.0 88.5 87.4 87.3 85.5  PLT 270 305 319 362 364 423*   Basic Metabolic Panel: Recent Labs  Lab 05/29/23 0516 05/30/23 0507 05/31/23 0546 06/01/23 0443 06/02/23 0313 06/03/23 0430  NA 142 140 140 140 141 139  K 4.0 4.0 3.9 3.7 4.2 4.2  CL 99 96* 97* 98 95* 95*  CO2 34* 35* 35* 36* 36* 35*  GLUCOSE 85 103* 133* 107* 101* 104*  BUN 21 24* 14 19 20 19   CREATININE 0.44 0.33* 0.36* 0.36* 0.42* 0.41*  CALCIUM 8.5* 9.0 9.0 8.5* 8.9 9.0  MG 1.8  --   --   --  1.9 1.9   Liver Function Tests: Recent Labs  Lab 05/28/23 0908  AST 18  ALT 12  ALKPHOS 185*  BILITOT 0.2  PROT 6.8  ALBUMIN 3.3*   CBG: No results for input(s): "GLUCAP" in the last 168 hours.  Discharge time spent: greater than 30  minutes.  Signed: Catarina Hartshorn, MD [facility] 06/03/2023

## 2023-06-03 NOTE — TOC Transition Note (Addendum)
Transition of Care [facility]) - Discharge Note   Patient Details  Name: Courtney Zuniga MRN: [id number] Date of Birth: 07-Aug-1950  Transition of Care [facility]) CM/SW Contact:  Leitha Bleak, RN Phone Number: 06/03/2023, 11:46 AM   Clinical Narrative:   Patient discharging home with [facility] home health PT/OT. Daughter here for transport, the hospital will provide a tank for the [name] to return. Angela updated with [facility], NIV will be delivered to the home today. [name] updated.   Palliative consulted TOC to refer patient to outpatient Palliative with [facility]. Referral sent.   Final next level of care: Home w Home Health Services Barriers to Discharge: Barriers Resolved   Patient Goals and CMS Choice Patient states their goals for this hospitalization and ongoing recovery are:: return home CMS Medicare.gov Compare Post Acute Care list provided to:: Patient Choice offered to / list presented to : Patient, Adult Children      Discharge Placement                Patient to be transferred to facility by: Daughter Name of family member notified: [name] Patient and family notified of of transfer: 06/03/23  Discharge Plan and Services Additional resources added to the After Visit Summary for   In-house Referral: Clinical Social Work Discharge Planning Services: CM Consult Post Acute Care Choice: Home Health          DME Arranged: NIV   Date DME Agency Contacted: 06/02/23 Time DME Agency Contacted: [id number] Representative spoke with at DME Agency: Marylene Land with [facility] HH Arranged: PT, OT HH Agency: [facility] Date [facility] Agency Contacted: 06/03/23 Time HH Agency Contacted: 1140 Representative spoke with at [facility] Agency: Clydie Braun  Social Drivers of Health (SDOH) Interventions SDOH Screenings   Food Insecurity: Patient Unable To Answer (05/28/2023)  Housing: Patient Unable To Answer (05/28/2023)  Transportation Needs: Patient Unable To Answer (05/28/2023)  Utilities:  Patient Unable To Answer (05/28/2023)  Financial Resource Strain: Low Risk  (05/05/2018)  Physical Activity: Insufficiently Active (05/05/2018)  Social Connections: Patient Unable To Answer (05/28/2023)  Stress: No Stress Concern Present (05/05/2018)  Tobacco Use: Medium Risk (06/01/2023)     Readmission Risk Interventions    05/29/2023    1:14 PM 08/26/2022   10:43 AM 05/19/2022   12:12 PM  Readmission Risk Prevention Plan  Transportation Screening Complete Complete Complete  HRI or Home Care Consult Complete  Complete  Social Work Consult for Recovery Care Planning/Counseling Complete  Complete  Palliative Care Screening Not Applicable  Not Applicable  Medication Review [employer]) Complete Complete Complete  HRI or Home Care Consult  Complete   SW Recovery Care/Counseling Consult  Complete   Palliative Care Screening  Not Applicable   Skilled Nursing Facility  Not Applicable

## 2023-06-03 NOTE — Progress Notes (Signed)
Palliative: Mrs. Nix is lying quietly in bed.  She appears chronically ill and somewhat frail.  She greets me, making and mostly keeping eye contact.  She is alert and oriented, able to make her needs known.  There is no family at bedside at this time although bedside nursing staff is present attending to needs.  We talked about the plan for discharge to home today.  We talked about oxygen needs and NIV.  Mrs. Edwards request an Ensure which is provided.  She is able to take this without overt signs and symptoms of aspiration.  We talked about the benefits of outpatient palliative services.  She readily agrees to this support.  Provider choice offered, she chooses [facility].   Conference with attending, bedside nursing staff, transition of care team related to patient condition, needs, goals of care, disposition.  Plan: Home with NIV, outpatient palliative services with [facility].   35 minutes  Lillia Carmel, NP Palliative medicine team Team phone [phone]

## 2023-06-03 NOTE — Progress Notes (Signed)
PROGRESS NOTE  Courtney Zuniga VHQ:[id number] DOB: 05-Jan-1951 DOA: 05/28/2023 PCP: Oneal Grout, FNP  Brief History:  73 year old female with COPD, chronic hypoxic respiratory failure, oxygen dependent on 4 L/min, history of recurrent non-small cell lung cancer, coronary artery disease history of NSTEMI, history of gastric ulcers, history of duodenal AVMs, currently on surveillance therapy for lung cancer after immunotherapy treatments, receiving CT scans every 4 months, reportedly has been having increasing shortness of breath symptoms over the past week that became acutely worse overnight.  She presented to the emergency department today with worsening shortness of breath symptoms and also reported lower abdominal pain.  She reports needing more than her baseline O2 requirement of 4 L/min had had increased it to 6 L/min had had oxygen saturation around 91%.  She reportedly was 87% on 4 L/min.  She was given bronchodilators in the emergency department with some improvement but not back to baseline.  She is reporting nonproductive cough at this time.  Influenza, RSV and SARS 2 coronavirus testing negative.  In the ED she received imaging studies with findings consistent with pneumonia.  CTA chest was negative for PE.  She was also noted to have a drop in her baseline hemoglobin and reported having intermittent melanotic stools.  Patient was started on broad-spectrum antibiotic therapy in addition to scheduled bronchodilator therapy and steroids and admission requested.  GI was consulted and recommended patient remain n.p.o. until GI consultation and started on IV pantoprazole for GI protection. After the initial drop in hemoglobin, her hemoglobin largely remained stable in the 8 range.  They recommended PPI twice daily and will arrange outpatient follow-up.  They recommended endoscopy in the outpatient setting, unless the patient has overt GI bleed or worsening anemia.   Assessment/Plan: Acute  on chronic respiratory failure with hypoxia and hypercarbia -Secondary to community-acquired pneumonia and COPD with acute exacerbation -finished 5 days ceftriaxone and azithro -Continue scheduled bronchodilators and IV steroids -BiPAP as needed and Q.H.S. order in place and will monitor in stepdown ICU -influenza, RSV, covid tests were negative  -pt remains HIGH RISK for intubation and likely will need home NIV which she is agreeable, I requested a palliative care consultation for goals of care discussion which is ongoing. -- pt continues to have frequent hospitalizations for acute on chronic respiratory failure. Ordering AVAPS-AE for CRF with Hypercapnia due to COPD/PNA. The addition of the auto-titrating feature is required for this hypercapnic patient to decrease the PaCO2 more efficiently and rapidly due to the severity of patient condition. Patient continues with persistent Hypercapnia with BIPAP use as prescribed during this and previous episodes of care. Volume Ventilation is indicated.   Ordering NIV with AVAPS-AE for use during hours of sleep as well as during waking hours when symptomatic. Home BIPAP/Home Bipap with VAPS is not appropriate for meeting this patient's ventilatory requirements due to the reasons below:    1. BIPAP-AVAPS flow capability is extremely limited (1/3 of what the NIV can achieve).   2. NIV with AVAPS-AE is preferred due to Auto Adjusting EPAP function allowing for continued upper airway patency throughout use.   3. NIV with AVAPS-AE has dual prescription modality allowing for seamless transition between modes when changes in the patient's clinical condition requires.   Lobar pneumonia -finished azithromycin and ceftriaxone -continue mucolytics  -continue scheduled bronchodilators -follow blood cultures and sputum culture-NGTD   COPD with acute exacerbation  -continue IV steroids -continue scheduled duonebs -continue mucolytics -goal  pulse ox is >88% given  longstanding COPD and chronic hypoxia  --discussion with patient to consider home NIV considering she seems to be more dependent on bipap and she seems open to the idea and discussing. TOC consulted to assist with home NIV arrangement.   -continue brovana -increase pulmicort to 0.5 mg   Acute on chronic anemia -Hg down to 9 from baseline 11 -reporting melanotic stools -appreciate GI consultation and recommendations -IV pantoprazole  -type and screen, transfuse for Hg<7 -anemia panel with finding of very low serum iron level -IV iron venofer given on 2/16 per GI recommendation   Stage IIIa non-small cell lung cancer - Followed closely by Dr. Shirline Frees with CT scanning every 4 months - Currently on observation therapy as she has completed immunotherapy treatments -CT CAP--no signs of recurrence presently   Question of recurrent GI bleed History of Gastric Ulcer History of duodenal AVMs -appreciate GI consultation - signed off, outpatient follow for EGD consideration -pantoprazole ordered for GI protection -follow Hg closely -- Holding stable around 8 -Transfuse for hemoglobin less than 7      Family Communication:  no Family at bedside  Consultants:  GI  Code Status:  FULL  DVT Prophylaxis:  SCDs   Procedures: As Listed in Progress Note Above  Antibiotics: CTX 2/13>>2/17 AZTH 2/13>>2/17     Subjective: Pt is breathing better.  Denies f/c, cp, sob, n/v/d, abd pain  Objective: Vitals:   06/03/23 0600 06/03/23 0736 06/03/23 0825 06/03/23 0829  BP: 120/73     Pulse: 98     Resp: (!) 24     Temp:  98.4 F (36.9 C)    TempSrc:  Oral    SpO2: 100%  99% 100%  Weight:      Height:        Intake/Output Summary (Last 24 hours) at 06/03/2023 0836 Last data filed at 06/02/2023 1734 Gross per 24 hour  Intake 1415.67 ml  Output --  Net 1415.67 ml   Weight change: 0 kg Exam:  General:  Pt is alert, follows commands appropriately, not in acute distress HEENT:  No icterus, No thrush, No neck mass, [place]/AT Cardiovascular: RRR, S1/S2, no rubs, no gallops Respiratory: bibasilar rales.  Bibasilar wheeze Abdomen: Soft/+BS, non tender, non distended, no guarding Extremities: No edema, No lymphangitis, No petechiae, No rashes, no synovitis   Data Reviewed: I have personally reviewed following labs and imaging studies Basic Metabolic Panel: Recent Labs  Lab 05/29/23 0516 05/30/23 0507 05/31/23 0546 06/01/23 0443 06/02/23 0313 06/03/23 0430  NA 142 140 140 140 141 139  K 4.0 4.0 3.9 3.7 4.2 4.2  CL 99 96* 97* 98 95* 95*  CO2 34* 35* 35* 36* 36* 35*  GLUCOSE 85 103* 133* 107* 101* 104*  BUN 21 24* 14 19 20 19   CREATININE 0.44 0.33* 0.36* 0.36* 0.42* 0.41*  CALCIUM 8.5* 9.0 9.0 8.5* 8.9 9.0  MG 1.8  --   --   --  1.9 1.9   Liver Function Tests: Recent Labs  Lab 05/28/23 0908  AST 18  ALT 12  ALKPHOS 185*  BILITOT 0.2  PROT 6.8  ALBUMIN 3.3*   Recent Labs  Lab 05/28/23 0907  LIPASE 19   No results for input(s): "AMMONIA" in the last 168 hours. Coagulation Profile: No results for input(s): "INR", "PROTIME" in the last 168 hours. CBC: Recent Labs  Lab 05/28/23 0908 05/29/23 0516 05/30/23 0507 05/31/23 0546 06/01/23 0443 06/03/23 0430  WBC 8.5 8.1 8.0 7.2  6.9 7.3  NEUTROABS 7.4 6.8  --   --   --   --   HGB 9.0* 8.2* 7.9* 8.7* 8.1* 9.0*  HCT 28.1* 25.3* 23.9* 26.3* 25.5* 26.6*  MCV 91.5 90.0 88.5 87.4 87.3 85.5  PLT 270 305 319 362 364 423*   Cardiac Enzymes: No results for input(s): "CKTOTAL", "CKMB", "CKMBINDEX", "TROPONINI" in the last 168 hours. BNP: Invalid input(s): "POCBNP" CBG: No results for input(s): "GLUCAP" in the last 168 hours. HbA1C: No results for input(s): "HGBA1C" in the last 72 hours. Urine analysis:    Component Value Date/Time   COLORURINE YELLOW 07/16/2022 1928   APPEARANCEUR CLEAR 07/16/2022 1928   LABSPEC 1.014 07/16/2022 1928   PHURINE 5.0 07/16/2022 1928   GLUCOSEU NEGATIVE 07/16/2022  1928   HGBUR NEGATIVE 07/16/2022 1928   BILIRUBINUR NEGATIVE 07/16/2022 1928   KETONESUR NEGATIVE 07/16/2022 1928   PROTEINUR NEGATIVE 07/16/2022 1928   UROBILINOGEN 0.2 03/31/2014 1406   NITRITE NEGATIVE 07/16/2022 1928   LEUKOCYTESUR NEGATIVE 07/16/2022 1928   Sepsis Labs: @LABRCNTIP (procalcitonin:4,lacticidven:4) ) Recent Results (from the past 240 hours)  Resp panel by RT-PCR (RSV, Flu A&B, Covid) Anterior Nasal Swab     Status: None   Collection Time: 05/28/23  9:09 AM   Specimen: Anterior Nasal Swab  Result Value Ref Range Status   SARS Coronavirus 2 by RT PCR NEGATIVE NEGATIVE Final    Comment: (NOTE) SARS-CoV-2 target nucleic acids are NOT DETECTED.  The SARS-CoV-2 RNA is generally detectable in upper respiratory specimens during the acute phase of infection. The lowest concentration of SARS-CoV-2 viral copies this assay can detect is 138 copies/mL. A negative result does not preclude SARS-Cov-2 infection and should not be used as the sole basis for treatment or other patient management decisions. A negative result may occur with  improper specimen collection/handling, submission of specimen other than nasopharyngeal swab, presence of viral mutation(s) within the areas targeted by this assay, and inadequate number of viral copies(<138 copies/mL). A negative result must be combined with clinical observations, patient history, and epidemiological information. The expected result is Negative.  Fact Sheet for Patients:  BloggerCourse.com  Fact Sheet for Healthcare Providers:  SeriousBroker.it  This test is no t yet approved or cleared by the [place] FDA and  has been authorized for detection and/or diagnosis of SARS-CoV-2 by FDA under an Emergency Use Authorization (EUA). This EUA will remain  in effect (meaning this test can be used) for the duration of the COVID-19 declaration under Section 564(b)(1) of the Act,  21 U.S.C.section 360bbb-3(b)(1), unless the authorization is terminated  or revoked sooner.       Influenza A by PCR NEGATIVE NEGATIVE Final   Influenza B by PCR NEGATIVE NEGATIVE Final    Comment: (NOTE) The Xpert Xpress SARS-CoV-2/FLU/RSV plus assay is intended as an aid in the diagnosis of influenza from Nasopharyngeal swab specimens and should not be used as a sole basis for treatment. Nasal washings and aspirates are unacceptable for Xpert Xpress SARS-CoV-2/FLU/RSV testing.  Fact Sheet for Patients: BloggerCourse.com  Fact Sheet for Healthcare Providers: SeriousBroker.it  This test is not yet approved or cleared by the [place] FDA and has been authorized for detection and/or diagnosis of SARS-CoV-2 by FDA under an Emergency Use Authorization (EUA). This EUA will remain in effect (meaning this test can be used) for the duration of the COVID-19 declaration under Section 564(b)(1) of the Act, 21 U.S.C. section 360bbb-3(b)(1), unless the authorization is terminated or revoked.     Resp  Syncytial Virus by PCR NEGATIVE NEGATIVE Final    Comment: (NOTE) Fact Sheet for Patients: BloggerCourse.com  Fact Sheet for Healthcare Providers: SeriousBroker.it  This test is not yet approved or cleared by the [place] FDA and has been authorized for detection and/or diagnosis of SARS-CoV-2 by FDA under an Emergency Use Authorization (EUA). This EUA will remain in effect (meaning this test can be used) for the duration of the COVID-19 declaration under Section 564(b)(1) of the Act, 21 U.S.C. section 360bbb-3(b)(1), unless the authorization is terminated or revoked.  Performed at [facility], [address], [place], [place] [address]   MRSA Next Gen by PCR, Nasal     Status: None   Collection Time: 05/28/23  8:50 PM   Specimen: Nasal Mucosa; Nasal Swab  Result Value Ref  Range Status   MRSA by PCR Next Gen NOT DETECTED NOT DETECTED Final    Comment: (NOTE) The GeneXpert MRSA Assay (FDA approved for NASAL specimens only), is one component of a comprehensive MRSA colonization surveillance program. It is not intended to diagnose MRSA infection nor to guide or monitor treatment for MRSA infections. Test performance is not FDA approved in patients less than 53 years old. Performed at [facility], [address] [place], [place] [address]      Scheduled Meds:  arformoterol  15 mcg Nebulization BID   budesonide (PULMICORT) nebulizer solution  0.5 mg Nebulization BID   Chlorhexidine Gluconate Cloth  6 each Topical Q0600   DULoxetine  60 mg Oral BID   feeding supplement  237 mL Oral TID BM   furosemide  20 mg Intravenous QODAY   ipratropium-albuterol  3 mL Nebulization Q6H   methylPREDNISolone (SOLU-MEDROL) injection  60 mg Intravenous Q12H   metoprolol tartrate  2.5 mg Intravenous Q6H   pantoprazole (PROTONIX) IV  40 mg Intravenous Q12H   tamsulosin  0.4 mg Oral QPC supper   Continuous Infusions:  Procedures/Studies: CT Angio Chest PE W/Cm &/Or Wo Cm Result Date: 05/28/2023 CLINICAL DATA:  History of non-small-cell lung cancer with shortness of breath and abdominal pain EXAM: CT ANGIOGRAPHY CHEST CT ABDOMEN AND PELVIS WITH CONTRAST TECHNIQUE: Multidetector CT imaging of the chest was performed using the standard protocol during bolus administration of intravenous contrast. Multiplanar CT image reconstructions and MIPs were obtained to evaluate the vascular anatomy. Multidetector CT imaging of the abdomen and pelvis was performed using the standard protocol during bolus administration of intravenous contrast. RADIATION DOSE REDUCTION: This exam was performed according to the departmental dose-optimization program which includes automated exposure control, adjustment of the mA and/or kV according to patient size and/or use of iterative reconstruction technique.  CONTRAST:  OMNIPAQUE IOHEXOL 350 MG/ML SOLN COMPARISON:  CT chest, abdomen, and pelvis dated 03/31/2023 FINDINGS: CTA CHEST FINDINGS Cardiovascular: Right chest wall port terminates in the right atrium. The study is high quality for the evaluation of pulmonary embolism. There are no filling defects in the central, lobar, segmental or subsegmental pulmonary artery branches to suggest acute pulmonary embolism. Great vessels are normal in course and caliber. Normal heart size. No significant pericardial fluid/thickening. Coronary artery calcifications. Mediastinum/Nodes: Imaged thyroid gland without nodules meeting criteria for imaging follow-up by size. Normal esophagus. No pathologically enlarged axillary, supraclavicular, mediastinal, or hilar lymph nodes. Lungs/Pleura: The central airways are patent. Increased peribronchial wall thickening, most notable in the right lower lobe with scattered areas of subsegmental mucous plugging in the bilateral lower lobes. New irregular bilateral lower lobe consolidations on a background of bilateral lower  lobe scarring/atelectasis. Unchanged severe centrilobular and paraseptal emphysema. Similar appearance of right perihilar and infrahilar postradiation changes. Decreased size of irregular right apical 1.6 x 1.4 cm nodule (5:27), previously 2.0 x 1.2 cm. No pneumothorax. Small right pleural effusion. Musculoskeletal: No acute or abnormal lytic or blastic osseous lesions. Multilevel degenerative changes of the thoracic spine. Review of the MIP images confirms the above findings. CT ABDOMEN and PELVIS FINDINGS Somewhat suboptimal timing of contrast bolus. Hepatobiliary: No focal hepatic lesions. Mild intra and extrahepatic bile duct dilation, likely secondary to cholecystectomy. Pancreas: No focal lesions or main ductal dilation. Spleen: Normal in size without focal abnormality. Adrenals/Urinary Tract: No adrenal nodules. No suspicious renal mass, calculi, or hydronephrosis.  No focal bladder wall thickening. Stomach/Bowel: Normal appearance of the stomach. No evidence of bowel wall thickening, distention, or inflammatory changes. Colonic diverticulosis without acute diverticulitis. Appendix is not discretely seen. Vascular/Lymphatic: Aortic atherosclerosis. No enlarged abdominal or pelvic lymph nodes. Reproductive: Mildly lobulated uterus with multifocal coarse calcifications, likely leiomyomata. No definite adnexal masses. Other: No free fluid, fluid collection, or free air. Musculoskeletal: No acute or abnormal lytic or blastic osseous lesions. Multilevel degenerative changes of the lumbar spine. IMPRESSION: 1. No evidence of pulmonary embolism. 2. Increased peribronchial wall thickening, most notable in the right lower lobe with scattered areas of subsegmental mucous plugging in the bilateral lower lobes, likely bronchitis. 3. New irregular bilateral lower lobe consolidations on a background of bilateral lower lobe scarring/atelectasis, suspicious for pneumonia. 4. Small right pleural effusion. 5. Slightly decreased size of irregular right apical nodule. 6. No acute findings in the abdomen or pelvis. 7. Colonic diverticulosis without acute diverticulitis. 8. Aortic Atherosclerosis (ICD10-I70.0) and Emphysema (ICD10-J43.9). Electronically Signed   By: Agustin Cree M.D.   On: 05/28/2023 12:05   CT ABDOMEN PELVIS W CONTRAST Result Date: 05/28/2023 CLINICAL DATA:  History of non-small-cell lung cancer with shortness of breath and abdominal pain EXAM: CT ANGIOGRAPHY CHEST CT ABDOMEN AND PELVIS WITH CONTRAST TECHNIQUE: Multidetector CT imaging of the chest was performed using the standard protocol during bolus administration of intravenous contrast. Multiplanar CT image reconstructions and MIPs were obtained to evaluate the vascular anatomy. Multidetector CT imaging of the abdomen and pelvis was performed using the standard protocol during bolus administration of intravenous contrast.  RADIATION DOSE REDUCTION: This exam was performed according to the departmental dose-optimization program which includes automated exposure control, adjustment of the mA and/or kV according to patient size and/or use of iterative reconstruction technique. CONTRAST:  OMNIPAQUE IOHEXOL 350 MG/ML SOLN COMPARISON:  CT chest, abdomen, and pelvis dated 03/31/2023 FINDINGS: CTA CHEST FINDINGS Cardiovascular: Right chest wall port terminates in the right atrium. The study is high quality for the evaluation of pulmonary embolism. There are no filling defects in the central, lobar, segmental or subsegmental pulmonary artery branches to suggest acute pulmonary embolism. Great vessels are normal in course and caliber. Normal heart size. No significant pericardial fluid/thickening. Coronary artery calcifications. Mediastinum/Nodes: Imaged thyroid gland without nodules meeting criteria for imaging follow-up by size. Normal esophagus. No pathologically enlarged axillary, supraclavicular, mediastinal, or hilar lymph nodes. Lungs/Pleura: The central airways are patent. Increased peribronchial wall thickening, most notable in the right lower lobe with scattered areas of subsegmental mucous plugging in the bilateral lower lobes. New irregular bilateral lower lobe consolidations on a background of bilateral lower lobe scarring/atelectasis. Unchanged severe centrilobular and paraseptal emphysema. Similar appearance of right perihilar and infrahilar postradiation changes. Decreased size of irregular right apical 1.6 x 1.4 cm nodule (5:27),  previously 2.0 x 1.2 cm. No pneumothorax. Small right pleural effusion. Musculoskeletal: No acute or abnormal lytic or blastic osseous lesions. Multilevel degenerative changes of the thoracic spine. Review of the MIP images confirms the above findings. CT ABDOMEN and PELVIS FINDINGS Somewhat suboptimal timing of contrast bolus. Hepatobiliary: No focal hepatic lesions. Mild intra and extrahepatic  bile duct dilation, likely secondary to cholecystectomy. Pancreas: No focal lesions or main ductal dilation. Spleen: Normal in size without focal abnormality. Adrenals/Urinary Tract: No adrenal nodules. No suspicious renal mass, calculi, or hydronephrosis. No focal bladder wall thickening. Stomach/Bowel: Normal appearance of the stomach. No evidence of bowel wall thickening, distention, or inflammatory changes. Colonic diverticulosis without acute diverticulitis. Appendix is not discretely seen. Vascular/Lymphatic: Aortic atherosclerosis. No enlarged abdominal or pelvic lymph nodes. Reproductive: Mildly lobulated uterus with multifocal coarse calcifications, likely leiomyomata. No definite adnexal masses. Other: No free fluid, fluid collection, or free air. Musculoskeletal: No acute or abnormal lytic or blastic osseous lesions. Multilevel degenerative changes of the lumbar spine. IMPRESSION: 1. No evidence of pulmonary embolism. 2. Increased peribronchial wall thickening, most notable in the right lower lobe with scattered areas of subsegmental mucous plugging in the bilateral lower lobes, likely bronchitis. 3. New irregular bilateral lower lobe consolidations on a background of bilateral lower lobe scarring/atelectasis, suspicious for pneumonia. 4. Small right pleural effusion. 5. Slightly decreased size of irregular right apical nodule. 6. No acute findings in the abdomen or pelvis. 7. Colonic diverticulosis without acute diverticulitis. 8. Aortic Atherosclerosis (ICD10-I70.0) and Emphysema (ICD10-J43.9). Electronically Signed   By: Agustin Cree M.D.   On: 05/28/2023 12:05   DG Chest 2 View Result Date: 05/28/2023 CLINICAL DATA:  Shortness of breath. EXAM: CHEST - 2 VIEW COMPARISON:  Chest radiograph dated 02/23/2023. CT chest dated 03/31/2023. FINDINGS: Stable cardiomediastinal silhouette. Aortic atherosclerosis. Stable right chest Port-A-Cath tip overlies the superior cavoatrial junction. Similar radiation  changes are again noted in the right perihilar region. The spiculated nodule in the right upper lobe is not well visualized on this exam. Small right pleural effusion with basilar atelectasis. Streaky opacities at the right lung base. Hyperinflation with similar diffuse coarsened interstitial markings. No pneumothorax. No acute osseous abnormality. IMPRESSION: 1. Small right pleural effusion with basilar atelectasis. Streaky opacities at the right lung base could reflect atelectasis/scarring, however, infiltrate can not be excluded. 2. Emphysema. Electronically Signed   By: Hart Robinsons M.D.   On: 05/28/2023 10:37    Catarina Hartshorn, DO  [facility]  If 7PM-7AM, please contact night-coverage [email or web address] Password [email or web address] 06/03/2023, 8:36 AM   LOS: 6 days

## 2023-06-16 ENCOUNTER — Encounter: Payer: Self-pay | Admitting: Physician Assistant

## 2023-06-16 ENCOUNTER — Encounter: Payer: Self-pay | Admitting: Internal Medicine

## 2023-06-18 ENCOUNTER — Inpatient Hospital Stay: Payer: 59 | Attending: Internal Medicine

## 2023-06-23 ENCOUNTER — Encounter: Payer: Self-pay | Admitting: Physician Assistant

## 2023-06-23 ENCOUNTER — Encounter: Payer: Self-pay | Admitting: Internal Medicine

## 2023-06-24 ENCOUNTER — Ambulatory Visit (INDEPENDENT_AMBULATORY_CARE_PROVIDER_SITE_OTHER): Payer: 59 | Admitting: Gastroenterology

## 2023-06-24 ENCOUNTER — Encounter: Payer: Self-pay | Admitting: Gastroenterology

## 2023-06-24 VITALS — BP 106/68 | HR 95 | Temp 97.5°F | Ht 66.0 in | Wt 120.2 lb

## 2023-06-24 DIAGNOSIS — D649 Anemia, unspecified: Secondary | ICD-10-CM | POA: Diagnosis not present

## 2023-06-24 DIAGNOSIS — D509 Iron deficiency anemia, unspecified: Secondary | ICD-10-CM

## 2023-06-24 NOTE — Progress Notes (Addendum)
 Gastroenterology Office Note     Primary Care Physician:  Oneal Grout, FNP  Primary Gastroenterologist: Marletta Lor   Chief Complaint   Chief Complaint  Patient presents with   Hospitalization Follow-up    Patient here today for a hospital follow up, from 05/28/2023, due to anemia, and Upper Gi bleed. Denies any chest pain or dizziness, she does have some shortness of breath, and is on continuous O2.     History of Present Illness   Courtney Zuniga is a 73 y.o. 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 Here for hospital follow-up.  Her admitting Hgb was 9, previously 12 in Nov 2024. As no obvious melena during admission, we recommended supportive measures with plans for surveillance EGD as outpatient. She has been consistently taking BC powders up to 6 per day. Discharge Hgb was 9.   Today:  Chronic DOE. On 4 liters O2. Last saw black stool yesterday. Stool is chronically dark. Been black since hospital. Taking Integra daily. Feels weaker. Nervous feeling. Home health nurse said her BP was low. SBP in the 80s per patient yesterday. Feels tired, worse than leaving the hospital. Just wants to lay around. Takes BC powders: about a 6 pack daily. Has headache after CPAP. Pantoprazole BID. No abdominal pain. No N/V.   BP initally today 97/59, then recheck 106/68. Pulse 95-103.   Transportation brought her today. She is declining ED evaluation. She is aware of risks of worsening anemia and affect on health. She is willing to do stat labs  and states if she has worsened anemia, she will then go to the ED.    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 Overdue for surveillance.  Endorsing significant NSAID use prior to admission. Recommending outpatient EGD once she is improved from respiratory standpoint. No need for urgent EGD while inpatient unless overt GI bleeding, worsening anemia.   Past Medical History:  Diagnosis Date   Anemia    as a young woman   Arthritis    osteoartritis   Asthma    Brain tumor (benign) (HCC) 2005 [facility]   Benign   Chronic headaches    Chronic hip pain    Chronic pain    COPD (chronic obstructive pulmonary disease) (HCC)    Coronary artery disease    Depression    Depression 05/15/2016   Encounter for antineoplastic chemotherapy 01/10/2016   GERD (gastroesophageal reflux disease)    Hypertension    Lung cancer (HCC) dx'd 01/2016   currently on chemo and radiation    NSTEMI (non-ST elevated myocardial infarction) (HCC) yrs ago   On home O2    qhs 2 liters at hs and prn   Pneumonia last time 2 yrs ago   Shortness of breath dyspnea    with activity    Past Surgical History:  Procedure Laterality Date   CHOLECYSTECTOMY     COLONOSCOPY  2015   Results requested from [facility]   COLONOSCOPY     ESOPHAGOGASTRODUODENOSCOPY N/A 08/14/2015   Procedure: ESOPHAGOGASTRODUODENOSCOPY (EGD);  Surgeon: Corbin Ade, MD;  Location: [facility];  Service: Endoscopy;  Laterality: N/A;  215    ESOPHAGOGASTRODUODENOSCOPY (EGD) WITH PROPOFOL N/A 09/13/2015   Procedure: ESOPHAGOGASTRODUODENOSCOPY (EGD) WITH PROPOFOL;  Surgeon: Rachael Fee, MD;  Location: [facility];  Service: Endoscopy;  Laterality: N/A;   ESOPHAGOGASTRODUODENOSCOPY (EGD) WITH PROPOFOL N/A 05/19/2022   Procedure: ESOPHAGOGASTRODUODENOSCOPY (EGD) WITH PROPOFOL;  Surgeon: Lanelle Bal, DO;  Location: [facility];  Service: Endoscopy;   Laterality: N/A;   EUS N/A 03/12/2017   Procedure: UPPER ENDOSCOPIC ULTRASOUND (EUS) RADIAL;  Surgeon: Rachael Fee, MD;  Location: [facility];  Service: Endoscopy;  Laterality: N/A;   HOT HEMOSTASIS  05/19/2022   Procedure: HOT HEMOSTASIS (ARGON PLASMA COAGULATION/BICAP);  Surgeon: Lanelle Bal, DO;  Location: [facility];  Service: Endoscopy;;   IR IMAGING GUIDED PORT INSERTION  01/20/2022   TUMOR REMOVAL  2005   Benign   UPPER ESOPHAGEAL ENDOSCOPIC ULTRASOUND (EUS)  09/13/2015   Procedure: UPPER ESOPHAGEAL ENDOSCOPIC ULTRASOUND (EUS);  Surgeon: Rachael Fee, MD;  Location: [facility];  Service: Endoscopy;;   VIDEO BRONCHOSCOPY WITH ENDOBRONCHIAL NAVIGATION N/A 12/31/2015   Procedure: VIDEO BRONCHOSCOPY WITH ENDOBRONCHIAL NAVIGATION;  Surgeon: Loreli Slot, MD;  Location: [facility] OR;  Service: Thoracic;  Laterality: N/A;   VIDEO BRONCHOSCOPY WITH ENDOBRONCHIAL ULTRASOUND N/A 11/08/2015   Procedure: VIDEO BRONCHOSCOPY WITH ENDOBRONCHIAL ULTRASOUND;  Surgeon: Kerin Perna, MD;  Location: [facility] OR;  Service: Thoracic;  Laterality: N/A;    Current Outpatient Medications  Medication Sig Dispense Refill   acetaminophen (TYLENOL) 325 MG tablet Take 2 tablets (650 mg total) by mouth every 6 (six) hours as needed for mild pain, fever or headache. 12 tablet 2   albuterol (PROVENTIL) (2.5 MG/3ML) 0.083% nebulizer solution Take 3 mLs (2.5 mg total) by nebulization 3 (three) times daily as needed for shortness of breath or wheezing. 75 mL 12   albuterol (VENTOLIN HFA) 108 (90 Base) MCG/ACT inhaler Inhale 2 puffs into the lungs every 4 (four) hours as needed for shortness of breath. 18 g 3   Aspirin-Acetaminophen-Caffeine (GOODYS EXTRA STRENGTH PO) Take 1 packet by mouth daily as needed (headaches).     benzonatate (TESSALON) 100 MG capsule Take 100 mg by mouth 3 (three) times daily as needed.     bisoprolol (ZEBETA) 5 MG tablet Take 0.5 tablets (2.5 mg total) by mouth daily. (Patient taking  differently: Take 2.5 mg by mouth daily as needed (high bp).) 45 tablet 1   BREO ELLIPTA 200-25 MCG/ACT AEPB Inhale 1 puff into the lungs daily.     Docusate Sodium (DSS) 100 MG CAPS Take 1 capsule by mouth daily as needed (constipation).     DULoxetine (CYMBALTA) 60 MG capsule Take 1 capsule (60 mg total) by mouth 2 (two) times daily. 180 capsule 3   ENSURE (ENSURE) Take 237 mLs by mouth 3 (three) times daily between meals.     FeFum-FePoly-FA-B Cmp-C-Biot (INTEGRA PLUS) CAPS Take 1 capsule by mouth every morning. 30 capsule 5   furosemide (LASIX) 20 MG tablet Take 1 tablet (20 mg total) by mouth daily as needed (3 pound weight gain). 30 tablet 0   Ipratropium-Albuterol (COMBIVENT RESPIMAT) 20-100 MCG/ACT AERS respimat Inhale 1 puff into the lungs every 6 (six) hours as needed for wheezing or shortness of breath. 4 g 3   Multiple Vitamins-Minerals (MULTIVITAMIN WITH MINERALS) tablet Take 1 tablet by mouth daily. 120 tablet 2   oxyCODONE-acetaminophen (PERCOCET/ROXICET) 5-325 MG tablet Take 1 tablet  by mouth every 8 (eight) hours as needed for moderate pain. 12 tablet 0   pantoprazole (PROTONIX) 40 MG tablet Take 1 tablet (40 mg total) by mouth 2 (two) times daily. 60 tablet 11   predniSONE (DELTASONE) 10 MG tablet Take 6 tablets (60 mg total) by mouth daily with breakfast. And decrease by one tablet daily 21 tablet 0   tamsulosin (FLOMAX) 0.4 MG CAPS capsule Take 1 capsule (0.4 mg total) by mouth daily after supper. 30 capsule 3   traZODone (DESYREL) 50 MG tablet Take 1 tablet (50 mg total) by mouth at bedtime. 30 tablet 5   Vitamin D, Ergocalciferol, (DRISDOL) 1.25 MG (50000 UNIT) CAPS capsule Take 1 capsule (50,000 Units total) by mouth once a week. 5 capsule 3   No current facility-administered medications for this visit.    Allergies as of 06/24/2023 - Review Complete 06/24/2023  Allergen Reaction Noted   Desyrel [trazodone] Other (See Comments) 07/17/2021    Family History  Problem  Relation Age of Onset   Ovarian cancer Mother    Lung cancer Father    Brain cancer Sister        Half sister   Lung cancer Brother    Lung cancer Brother    Prostate cancer Brother    Colon cancer Neg Hx    Colon polyps Neg Hx     Social History   Socioeconomic History   Marital status: Single    Spouse name: Not on file   Number of children: Not on file   Years of education: 10th grade   Highest education level: Not on file  Occupational History   Occupation: retired  Tobacco Use   Smoking status: Former    Current packs/day: 0.00    Average packs/day: 1 pack/day for 38.0 years (38.0 ttl pk-yrs)    Types: Cigarettes    Start date: 07/25/1980    Quit date: 07/26/2018    Years since quitting: 4.9   Smokeless tobacco: Never   Tobacco comments:    Pt has asked doctor for med to help   Vaping Use   Vaping status: Never Used  Substance and Sexual Activity   Alcohol use: No    Alcohol/week: 0.0 standard drinks of alcohol   Drug use: No   Sexual activity: Not Currently  Other Topics Concern   Not on file  Social History Narrative   Not on file   Social Drivers of Health   Financial Resource Strain: Low Risk  (05/05/2018)   Overall Financial Resource Strain (CARDIA)    Difficulty of Paying Living Expenses: Not very hard  Food Insecurity: Patient Unable To Answer (05/28/2023)   Hunger Vital Sign    Worried About Running Out of Food in the Last Year: Patient unable to answer    Ran Out of Food in the Last Year: Patient unable to answer  Transportation Needs: Patient Unable To Answer (05/28/2023)   PRAPARE - Transportation    Lack of Transportation (Medical): Patient unable to answer    Lack of Transportation (Non-Medical): Patient unable to answer  Physical Activity: Insufficiently Active (05/05/2018)   Exercise Vital Sign    Days of Exercise per Week: 5 days    Minutes of Exercise per Session: 10 min  Stress: No Stress Concern Present (05/05/2018)   [employer]  of Occupational Health - Occupational Stress Questionnaire    Feeling of Stress : Not at all  Social Connections: Patient Unable To Answer (05/28/2023)   Social Connection and  Isolation Panel [NHANES]    Frequency of Communication with Friends and Family: Patient unable to answer    Frequency of Social Gatherings with Friends and Family: Patient unable to answer    Attends Religious Services: Patient unable to answer    Active Member of Clubs or Organizations: Patient unable to answer    Attends Banker Meetings: Patient unable to answer    Marital Status: Patient unable to answer  Intimate Partner Violence: Patient Unable To Answer (05/28/2023)   Humiliation, Afraid, Rape, and Kick questionnaire    Fear of Current or Ex-Partner: Patient unable to answer    Emotionally Abused: Patient unable to answer    Physically Abused: Patient unable to answer    Sexually Abused: Patient unable to answer     Review of Systems   Gen: Denies any fever, chills, fatigue, weight loss, lack of appetite.  CV: Denies chest pain, heart palpitations, peripheral edema, syncope.  Resp: Denies shortness of breath at rest or with exertion. Denies wheezing or cough.  GI: Denies dysphagia or odynophagia. Denies jaundice, hematemesis, fecal incontinence. GU : Denies urinary burning, urinary frequency, urinary hesitancy MS: Denies joint pain, muscle weakness, cramps, or limitation of movement.  Derm: Denies rash, itching, dry skin Psych: Denies depression, anxiety, memory loss, and confusion Heme: Denies bruising, bleeding, and enlarged lymph nodes.   Physical Exam   BP 106/68 (BP Location: Right Arm, Patient Position: Sitting, Cuff Size: Normal)   Pulse 95   Temp (!) 97.5 F (36.4 C) (Temporal)   Ht 5\' 6"  (1.676 m)   Wt 120 lb 3.2 oz (54.5 kg)   BMI 19.40 kg/m  General:   Alert and oriented. Pleasant and cooperative. Thin, 4 liters O2. Without labored breathing at rest.  Head:  Normocephalic  and atraumatic. Eyes:  Without icterus Heart: S1 S2 present, no tachycardia with auscultation.  Lungs: coarse bilaterally, 4 liters nasal O2  Abdomen:  +BS, soft, non-tender and non-distended. Thin Rectal:  Deferred  Msk:  Symmetrical without gross deformities. Normal posture. Extremities:  Without edema. Neurologic:  Alert and  oriented x4    Assessment   Courtney Zuniga is a 73 y.o. 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 Here for hospital follow-up.  Hx of occult GI bleed: last Hgb at discharge was 9, and she reports chronic black stool on iron (even prior to admission). Last black stool yesterday. Continues to take [facility] powders up to 6 per day. On pantoprazole BID. No hematochezia, abdominal pain, N/V. Soft BPs on recheck today, and I am concerned for worsening anemia in light of her fatigue, possible melena but unclear as she is on iron. She is at baseline for respiratory status and does not feel this is worsened from discharge. I recommended ED evaluation, but she is declining. Transportation has brought her today, and she is agreeable to labs later this afternoon at her PCP. I have contacted PCP office, who state they can see her at 1 for labs. I have informed patient of this, and if she is unable to make that, she will need to arrange labs at latest tomorrow (stat  CBC). She is completely willing to present to the ED if labs show anemia is worsening, but she continues to decline EMS or ED evaluation.      PLAN   Stat CBC Patient aware of  signs/symptoms that would prompt ED evaluation Recommend ED evaluation but patient would rather have labs as outpatient PPI BID Absolutely avoid BC powders going forward Further recommendations to follow    Gelene Mink, PhD, ANP-BC [facility]   Addendum: Labs reviewed. CBC with Hgb 9.3. Discharge Hgb was 9.0. Overall stable. She is overdue for surveillance EGD. Will request pulmonary clearance prior.  Gelene Mink, PhD, ANP-BC [facility]

## 2023-06-24 NOTE — Patient Instructions (Signed)
 Please have blood work done as soon as possible. Your family doctor said they could draw this at 1pm if you are able to get there. If not, you will have to call to make an appointment for later today or tomorrow.  I recommend going to the emergency room if you have worsening shortness of breath from your baseline, worsening fatigue, dizzy, low blood pressure. I am worried that your blood counts have dropped. I feel the best place would be to go to the emergency room today, but I understand you are wanting to avoid that.  It is very important that you stop all BC powders. This will make you bleed more.   Further recommendations after the lab is back!  I enjoyed seeing you again today! I value our relationship and want to provide genuine, compassionate, and quality care. You may receive a survey regarding your visit with me, and I welcome your feedback! Thanks so much for taking the time to complete this. I look forward to seeing you again.      Gelene Mink, PhD, ANP-BC [facility]

## 2023-06-28 ENCOUNTER — Telehealth: Payer: Self-pay | Admitting: Gastroenterology

## 2023-06-28 DIAGNOSIS — J441 Chronic obstructive pulmonary disease with (acute) exacerbation: Secondary | ICD-10-CM

## 2023-06-28 DIAGNOSIS — J45901 Unspecified asthma with (acute) exacerbation: Secondary | ICD-10-CM

## 2023-06-28 DIAGNOSIS — J9601 Acute respiratory failure with hypoxia: Secondary | ICD-10-CM

## 2023-06-28 NOTE — Telephone Encounter (Signed)
 Outside Hgb 9.3. Stable from discharge. She is overdue for surveillance EGD. Needs pulmonary clearance prior to this. EGD with Dr. Marletta Lor, ASA 3, for hx PUD and anemia, once pulmonary clearance obtained.

## 2023-06-29 NOTE — Telephone Encounter (Signed)
 Called pt. She stated she has seen [facility] in about 2-2.5 yrs. She asked if I can refer her to someone in [place]. I advised will do. Ronita Hipps

## 2023-06-29 NOTE — Addendum Note (Signed)
 Addended by: Armstead Peaks on: 06/29/2023 09:29 AM   Modules accepted: Orders

## 2023-07-23 ENCOUNTER — Encounter: Payer: Self-pay | Admitting: Physician Assistant

## 2023-07-23 ENCOUNTER — Encounter: Payer: Self-pay | Admitting: Internal Medicine

## 2023-07-28 ENCOUNTER — Encounter: Payer: Self-pay | Admitting: Internal Medicine

## 2023-07-28 ENCOUNTER — Encounter: Payer: Self-pay | Admitting: Physician Assistant

## 2023-08-04 ENCOUNTER — Encounter (HOSPITAL_COMMUNITY): Payer: Self-pay

## 2023-08-04 ENCOUNTER — Ambulatory Visit (HOSPITAL_COMMUNITY)
Admission: RE | Admit: 2023-08-04 | Discharge: 2023-08-04 | Disposition: A | Discharge status: Home / Self Care | Source: Ambulatory Visit | Attending: Internal Medicine | Admitting: Internal Medicine

## 2023-08-04 DIAGNOSIS — C3490 Malignant neoplasm of unspecified part of unspecified bronchus or lung: Secondary | ICD-10-CM | POA: Diagnosis present

## 2023-08-05 ENCOUNTER — Encounter: Payer: Self-pay | Admitting: Internal Medicine

## 2023-08-05 ENCOUNTER — Encounter: Payer: Self-pay | Admitting: Physician Assistant

## 2023-08-05 NOTE — Progress Notes (Unsigned)
 Courtney Zuniga, female    DOB: May 31, 1950    MRN: [id number]   Brief patient profile:  32  yobf  quit smoking 06/2023   referred to pulmonary clinic in [place]  08/06/2023 by Torrence Freeze NP  for copd/ chronic hypercarbid and hypoxemia resp failure  s/p admit  Not prev seen by PCCM service  Admit date:     05/28/2023  Discharge date: 06/03/23       Hospital Course: 73 year old female with COPD, chronic hypoxic respiratory failure, oxygen  dependent on 4 L/min, history of recurrent non-small cell lung cancer, coronary artery disease history of NSTEMI, history of gastric ulcers, history of duodenal AVMs, currently on surveillance therapy for lung cancer after immunotherapy treatments, receiving CT scans every 4 months, reportedly has been having increasing shortness of breath symptoms over the past week that became acutely worse overnight.  She presented to the emergency department today with worsening shortness of breath symptoms and also reported lower abdominal pain.  She reports needing more than her baseline O2 requirement of 4 L/min had had increased it to 6 L/min had had oxygen  saturation around 91%.  She reportedly was 87% on 4 L/min.  She was given bronchodilators in the emergency department with some improvement but not back to baseline.  She is reporting nonproductive cough at this time.  Influenza, RSV and SARS 2 coronavirus testing negative.  In the ED she received imaging studies with findings consistent with pneumonia.  CTA chest was negative for PE.  She was also noted to have a drop in her baseline hemoglobin and reported having intermittent melanotic stools.  Patient was started on broad-spectrum antibiotic therapy in addition to scheduled bronchodilator therapy and steroids and admission requested.  GI was consulted and recommended patient remain n.p.o. until GI consultation and started on IV pantoprazole  for GI protection. After the initial drop in hemoglobin, her hemoglobin  largely remained stable in the 8 range.  They recommended PPI twice daily and will arrange outpatient follow-up.  They recommended endoscopy in the outpatient setting, unless the patient has overt GI bleed or worsening anemia.   Assessment and Plan: Acute on chronic respiratory failure with hypoxia and hypercarbia -Secondary to community-acquired pneumonia and COPD with acute exacerbation -finished 5 days ceftriaxone  and azithro -Continue scheduled bronchodilators and IV steroids -BiPAP as needed and Q.H.S. order in place and will monitor in stepdown ICU -influenza, RSV, covid tests were negative  -pt remains HIGH RISK for intubation and likely will need home NIV which she is agreeable, I requested a palliative care consultation for goals of care discussion which is ongoing. -- pt continues to have frequent hospitalizations for acute on chronic respiratory failure. Ordering AVAPS-AE for CRF with Hypercapnia due to COPD/PNA. The addition of the auto-titrating feature is required for this hypercapnic patient to decrease the PaCO2 more efficiently and rapidly due to the severity of patient condition. Patient continues with persistent Hypercapnia with BIPAP use as prescribed during this and previous episodes of care. Volume Ventilation is indicated.   Ordering NIV with AVAPS-AE for use during hours of sleep as well as during waking hours when symptomatic. Home BIPAP/Home Bipap with VAPS is not appropriate for meeting this patient's ventilatory requirements due to the reasons below:    1. BIPAP-AVAPS flow capability is extremely limited (1/3 of what the NIV can achieve).   2. NIV with AVAPS-AE is preferred due to Auto Adjusting EPAP function allowing for continued upper airway patency throughout use.   3. NIV with AVAPS-AE  has dual prescription modality allowing for seamless transition between modes when changes in the patient's clinical condition requires.   Lobar pneumonia -finished azithromycin   and ceftriaxone  -continue mucolytics  -continue scheduled bronchodilators -follow blood cultures and sputum culture-NGTD   COPD with acute exacerbation  -continue IV steroids>>d/c home with prednisone  taper -continue scheduled duonebs -continue mucolytics -goal pulse ox is >88% given longstanding COPD and chronic hypoxia  --discussion with patient to consider home NIV considering she seems to be more dependent on bipap and she seems open to the idea and discussing. TOC consulted to assist with home NIV arrangement.   -continue brovana  -increased pulmicort  to 0.5 mg   Acute on chronic anemia -Hg down to 9 from baseline 11 -reporting melanotic stools -appreciate GI consultation and recommendations -IV pantoprazole   -type and screen, transfuse for Hg<7 -anemia panel with finding of very low serum iron  level -IV iron  venofer  given on 2/16 per GI recommendation   Stage IIIa non-small cell lung cancer - Followed closely by Dr. Liam Redhead with CT scanning every 4 months -previously on chemo and nivolumab  - Currently on observation as she has completed immunotherapy treatments -CT CAP--no signs of recurrence presently   Question of recurrent GI bleed History of Gastric Ulcer History of duodenal AVMs -appreciate GI consultation - signed off, outpatient follow for EGD consideration -pantoprazole  ordered for GI protection -follow Hg closely -- Holding stable around 8 -Transfuse for hemoglobin less than 7     DIAGNOSIS: Recurrent non-small cell lung cancer initially diagnosed as stage IIIA (T1b, N2, M0) non-small cell lung cancer favoring adenocarcinoma presented with right middle lobe pulmonary nodule, mediastinal lymphadenopathy and highly suspicious for small nodule in the left upper lobe that could change her stage to stage IV that could present another synchronous primary lesion in the left upper lobe. This was diagnosed in September 2017.   PRIOR THERAPY:  1) Concurrent  chemoradiation with weekly carboplatin  for AUC of 2 and paclitaxel  45 MG/M2 status post 6 cycles last dose was given 02/25/2016 with partial response. 2) Consolidation chemotherapy with carboplatin  for AUC of 5 and paclitaxel  175 MG/M2 every 3 weeks with Neulasta  support. First dose 04/22/2016. Status post 3 cycles. 3) Second line treatment with immunotherapy with Nivolumab  480 mg IV every 4 weeks status post 32 cycles.  Discontinued secondary to intolerance and frequent hospitalization with pneumonia and pneumonitis.  She had a break of treatment between November 24, 2019 until June 28, 2020. 4) Resuming her treatment with immunotherapy with nivolumab  480 mg IV every 4 weeks.  Cycle #33 started on June 28, 2020.  Status post 55 cycles.   CURRENT THERAPY: Observation     History of Present Illness  08/06/2023  Pulmonary/ 1st office eval/ Kristel Durkee / [facility] Jodell Munda  Chief Complaint  Patient presents with   Establish Care  Dyspnea:  room to room at home on walker, w/c to go anywhere else  Cough: severe x one month x white  Sleep: bricks under heaboard and pillows under head lots of cough wakes her up even on NIV  SABA use: neb tid  doesn't seem to help much  02: 4lpm     No obvious day to day or daytime pattern/variability or assoc excess/ purulent sputum or mucus plugs or hemoptysis or cp or chest tightness, subjective wheeze or overt sinus or hb symptoms.    Also denies any obvious fluctuation of symptoms with weather or environmental changes or other aggravating or alleviating factors except as outlined above  No unusual exposure hx or h/o childhood pna/ asthma or knowledge of premature birth.  Current Allergies, Complete Past Medical History, Past Surgical History, Family History, and Social History were reviewed in [employer] record.  ROS  The following are not active complaints unless bolded Hoarseness, sore throat, dysphagia, dental problems, itching,  sneezing,  nasal congestion or discharge of excess mucus or purulent secretions, ear ache,   fever, chills, sweats, unintended wt loss or wt gain, classically pleuritic or exertional cp,  orthopnea pnd or arm/hand swelling  or leg swelling, presyncope, palpitations, abdominal pain, anorexia, nausea, vomiting, diarrhea  or change in bowel habits or change in bladder habits, change in stools or change in urine, dysuria, hematuria,  rash, arthralgias, visual complaints, headache, numbness, weakness or ataxia or problems with walking or coordination,  change in mood or  memory.            Outpatient Medications Prior to Visit  Medication Sig Dispense Refill   acetaminophen  (TYLENOL ) 325 MG tablet Take 2 tablets (650 mg total) by mouth every 6 (six) hours as needed for mild pain, fever or headache. 12 tablet 2   albuterol  (PROVENTIL ) (2.5 MG/3ML) 0.083% nebulizer solution Take 3 mLs (2.5 mg total) by nebulization 3 (three) times daily as needed for shortness of breath or wheezing. 75 mL 12   albuterol  (VENTOLIN  HFA) 108 (90 Base) MCG/ACT inhaler Inhale 2 puffs into the lungs every 4 (four) hours as needed for shortness of breath. 18 g 3   Aspirin -Acetaminophen -Caffeine  (GOODYS EXTRA STRENGTH PO) Take 1 packet by mouth daily as needed (headaches).     benzonatate (TESSALON) 100 MG capsule Take 100 mg by mouth 3 (three) times daily as needed.     bisoprolol  (ZEBETA ) 5 MG tablet Take 0.5 tablets (2.5 mg total) by mouth daily. (Patient taking differently: Take 2.5 mg by mouth daily as needed (high bp).) 45 tablet 1   BREO ELLIPTA  200-25 MCG/ACT AEPB Inhale 1 puff into the lungs daily.     Docusate Sodium  (DSS) 100 MG CAPS Take 1 capsule by mouth daily as needed (constipation).     DULoxetine  (CYMBALTA ) 60 MG capsule Take 1 capsule (60 mg total) by mouth 2 (two) times daily. 180 capsule 3   ENSURE (ENSURE) Take 237 mLs by mouth 3 (three) times daily between meals.     FeFum-FePoly-FA-B Cmp-C-Biot (INTEGRA  PLUS) CAPS Take 1 capsule by mouth every morning. 30 capsule 5   furosemide  (LASIX ) 20 MG tablet Take 1 tablet (20 mg total) by mouth daily as needed (3 pound weight gain). 30 tablet 0   Ipratropium-Albuterol  (COMBIVENT  RESPIMAT) 20-100 MCG/ACT AERS respimat Inhale 1 puff into the lungs every 6 (six) hours as needed for wheezing or shortness of breath. 4 g 3   Multiple Vitamins-Minerals (MULTIVITAMIN WITH MINERALS) tablet Take 1 tablet by mouth daily. 120 tablet 2   oxyCODONE -acetaminophen  (PERCOCET/ROXICET) 5-325 MG tablet Take 1 tablet by mouth every 8 (eight) hours as needed for moderate pain. 12 tablet 0   pantoprazole  (PROTONIX ) 40 MG tablet Take 1 tablet (40 mg total) by mouth 2 (two) times daily. 60 tablet 11   predniSONE  (DELTASONE ) 10 MG tablet Take 6 tablets (60 mg total) by mouth daily with breakfast. And decrease by one tablet daily 21 tablet 0   tamsulosin  (FLOMAX ) 0.4 MG CAPS capsule Take 1 capsule (0.4 mg total) by mouth daily after supper. 30 capsule 3   traZODone  (DESYREL ) 50 MG tablet Take 1 tablet (50 mg  total) by mouth at bedtime. 30 tablet 5   Vitamin D , Ergocalciferol , (DRISDOL ) 1.25 MG (50000 UNIT) CAPS capsule Take 1 capsule (50,000 Units total) by mouth once a week. 5 capsule 3   No facility-administered medications prior to visit.    Past Medical History:  Diagnosis Date   Anemia    as a young woman   Arthritis    osteoartritis   Asthma    Brain tumor (benign) (HCC) 2005 [facility]   Benign   Chronic headaches    Chronic hip pain    Chronic pain    COPD (chronic obstructive pulmonary disease) (HCC)    Coronary artery disease    Depression    Depression 05/15/2016   Encounter for antineoplastic chemotherapy 01/10/2016   GERD (gastroesophageal reflux disease)    Hypertension    Lung cancer (HCC) dx'd 01/2016   currently on chemo and radiation    NSTEMI (non-ST elevated myocardial infarction) (HCC) yrs ago   On home O2    qhs 2 liters at hs and prn   Pneumonia  last time 2 yrs ago   Shortness of breath dyspnea    with activity      Objective:     BP 103/65   Pulse 97   Ht 5\' 6"  (1.676 m)   Wt 121 lb (54.9 kg)   SpO2 (!) 83% Comment: 3L cont  BMI 19.53 kg/m   SpO2: (!) 83 % (3L cont)  Elderly bf/  wheelchair bound and  very frail and easily confused with details of care   W/ bound / minimal insp /exp rhonchi   HC03   35  06/03/23    I personally reviewed images and   impression same as prior study 05/28/23  as follows:  CTa 08/04/23  Severe centrilobular and paraseptal emphysema. Similar appearance of right perihilar and infrahilar postradiation changes. Decreased size of irregular right apical 1.6 x 1.4 cm nodule (5:27), previously 2.0 x 1.2 cm. No pneumothorax. Small right pleural effusion.    Assessment   No problem-specific Assessment & Plan notes found for this encounter.     Vernestine Gondola, MD 08/06/2023

## 2023-08-06 ENCOUNTER — Ambulatory Visit (INDEPENDENT_AMBULATORY_CARE_PROVIDER_SITE_OTHER): Admitting: Internal Medicine

## 2023-08-06 ENCOUNTER — Encounter: Payer: Self-pay | Admitting: Internal Medicine

## 2023-08-06 VITALS — BP 103/65 | HR 97 | Ht 66.0 in | Wt 121.0 lb

## 2023-08-06 DIAGNOSIS — J9611 Chronic respiratory failure with hypoxia: Secondary | ICD-10-CM | POA: Diagnosis not present

## 2023-08-06 DIAGNOSIS — J181 Lobar pneumonia, unspecified organism: Secondary | ICD-10-CM | POA: Diagnosis not present

## 2023-08-06 DIAGNOSIS — Z85118 Personal history of other malignant neoplasm of bronchus and lung: Secondary | ICD-10-CM

## 2023-08-06 DIAGNOSIS — R0609 Other forms of dyspnea: Secondary | ICD-10-CM

## 2023-08-06 DIAGNOSIS — J9612 Chronic respiratory failure with hypercapnia: Secondary | ICD-10-CM

## 2023-08-06 DIAGNOSIS — Z87891 Personal history of nicotine dependence: Secondary | ICD-10-CM

## 2023-08-06 DIAGNOSIS — J441 Chronic obstructive pulmonary disease with (acute) exacerbation: Secondary | ICD-10-CM

## 2023-08-06 DIAGNOSIS — D500 Iron deficiency anemia secondary to blood loss (chronic): Secondary | ICD-10-CM

## 2023-08-06 DIAGNOSIS — J449 Chronic obstructive pulmonary disease, unspecified: Secondary | ICD-10-CM

## 2023-08-06 NOTE — Patient Instructions (Addendum)
 Target for oxygen  saturation is 90% and not much higher  For cough > mucinex  dm 1200 mg every 12 hours and add oxycodone  up to every 4 hours as needed   Ok to take a nebulizer treatment at bedtime to see if that helps you sleep better   Please remember to go to the lab department   for your tests - we will call you with the results when they are available.  Follow up here is as needed but must bring all your medications/ inhalers/ solutions                         Please remember to go to the lab department   for your tests - we will call you with the results when they are available.   We will see if based on CT and lab results whether any follow up here is needed but you must bring all medications/ inhalers/ solution

## 2023-08-07 ENCOUNTER — Encounter: Payer: Self-pay | Admitting: Internal Medicine

## 2023-08-07 DIAGNOSIS — J449 Chronic obstructive pulmonary disease, unspecified: Secondary | ICD-10-CM | POA: Insufficient documentation

## 2023-08-07 DIAGNOSIS — J9611 Chronic respiratory failure with hypoxia: Secondary | ICD-10-CM | POA: Insufficient documentation

## 2023-08-07 LAB — CBC WITH DIFFERENTIAL/PLATELET
Basophils Absolute: 0 10*3/uL (ref 0.0–0.2)
Basos: 1 %
EOS (ABSOLUTE): 0.1 10*3/uL (ref 0.0–0.4)
Eos: 2 %
Hematocrit: 32.8 % — ABNORMAL LOW (ref 34.0–46.6)
Hemoglobin: 9.6 g/dL — ABNORMAL LOW (ref 11.1–15.9)
Immature Grans (Abs): 0 10*3/uL (ref 0.0–0.1)
Immature Granulocytes: 0 %
Lymphocytes Absolute: 0.8 10*3/uL (ref 0.7–3.1)
Lymphs: 13 %
MCH: 24.7 pg — ABNORMAL LOW (ref 26.6–33.0)
MCHC: 29.3 g/dL — ABNORMAL LOW (ref 31.5–35.7)
MCV: 85 fL (ref 79–97)
Monocytes Absolute: 0.3 10*3/uL (ref 0.1–0.9)
Monocytes: 5 %
Neutrophils Absolute: 4.8 10*3/uL (ref 1.4–7.0)
Neutrophils: 79 %
Platelets: 345 10*3/uL (ref 150–450)
RBC: 3.88 x10E6/uL (ref 3.77–5.28)
RDW: 16.7 % — ABNORMAL HIGH (ref 11.7–15.4)
WBC: 6.1 10*3/uL (ref 3.4–10.8)

## 2023-08-07 LAB — BASIC METABOLIC PANEL WITH GFR
BUN/Creatinine Ratio: 38 — ABNORMAL HIGH (ref 12–28)
BUN: 18 mg/dL (ref 8–27)
CO2: 30 mmol/L — ABNORMAL HIGH (ref 20–29)
Calcium: 9.2 mg/dL (ref 8.7–10.3)
Chloride: 100 mmol/L (ref 96–106)
Creatinine, Ser: 0.47 mg/dL — ABNORMAL LOW (ref 0.57–1.00)
Glucose: 81 mg/dL (ref 70–99)
Potassium: 4.7 mmol/L (ref 3.5–5.2)
Sodium: 146 mmol/L — ABNORMAL HIGH (ref 134–144)
eGFR: 100 mL/min/{1.73_m2} (ref >59)

## 2023-08-07 LAB — BRAIN NATRIURETIC PEPTIDE: BNP: 111.2 pg/mL — ABNORMAL HIGH (ref 0.0–100.0)

## 2023-08-07 LAB — SEDIMENTATION RATE: Sed Rate: 13 mm/h (ref 0–40)

## 2023-08-07 LAB — TSH: TSH: 1.95 u[IU]/mL (ref 0.450–4.500)

## 2023-08-07 NOTE — Assessment & Plan Note (Addendum)
 Quit smoking 06/2023  - Spirometry  10/2015  FEV1 1.28  (57%)  Ratio 0.44 with classic concavity on f/v loop   Most likely pt has advanced to near endstage dz as continued to smoke for 8 y p dx of GOLD 2 complicated by lung ca s/p multiple rx and now hypercarbic/hypoxemc rf  Rec Ok to take neb up to 4 x daily   For cough/ congestion > mucinex  or mucinex  dm  up to maximum of  1200 mg every 12 hours and add oxycodone  up to every 4 hours as needed   If continues to fail to thrive would strongly consider hospice referral rather than return  to pulmonary clinic as really not much else we can offer

## 2023-08-07 NOTE — Assessment & Plan Note (Addendum)
 HC03   35  06/03/23  - Place on NIV for hs and prn use p d/c  06/03/23   Though somewhat paradoxic, when the lung fails to clear C02 properly and pC02 rises the lung then becomes a more efficient scavenger of C02 allowing lower work of breathing and  better C02 clearance albeit at a higher serum pC02 level - this is why pts can look a lot better than their ABG's would suggest and why it's so difficult to prognosticate endstage dz.  It's also why I strongly rec DNI status (ventilating pts down to a nl pC02 adversely affects this compensatory mechanism) with NIV used in this setting if helps with comfort measure but nothing beyond present set up.         Each maintenance medication was reviewed in detail including emphasizing most importantly the difference between maintenance and prns and under what circumstances the prns are to be triggered using an action plan format where appropriate.  Total time for H and P, chart review, counseling, reviewing dpi/neb/02/NIV device(s) and generating customized AVS unique to this office visit / same day charting = 49 min new pt eval for chronic refractory respiratory  symptoms

## 2023-08-09 ENCOUNTER — Emergency Department (HOSPITAL_COMMUNITY)

## 2023-08-09 ENCOUNTER — Encounter (HOSPITAL_COMMUNITY): Payer: Self-pay

## 2023-08-09 ENCOUNTER — Inpatient Hospital Stay (HOSPITAL_COMMUNITY)
Admission: EM | Admit: 2023-08-09 | Discharge: 2023-08-12 | DRG: 193 | Disposition: A | Discharge status: Home / Self Care | Attending: Internal Medicine | Admitting: Internal Medicine

## 2023-08-09 ENCOUNTER — Other Ambulatory Visit: Payer: Self-pay

## 2023-08-09 DIAGNOSIS — B9789 Other viral agents as the cause of diseases classified elsewhere: Secondary | ICD-10-CM | POA: Diagnosis present

## 2023-08-09 DIAGNOSIS — Z808 Family history of malignant neoplasm of other organs or systems: Secondary | ICD-10-CM

## 2023-08-09 DIAGNOSIS — Z79899 Other long term (current) drug therapy: Secondary | ICD-10-CM

## 2023-08-09 DIAGNOSIS — J189 Pneumonia, unspecified organism: Principal | ICD-10-CM | POA: Diagnosis present

## 2023-08-09 DIAGNOSIS — J9621 Acute and chronic respiratory failure with hypoxia: Secondary | ICD-10-CM | POA: Diagnosis present

## 2023-08-09 DIAGNOSIS — Z8042 Family history of malignant neoplasm of prostate: Secondary | ICD-10-CM

## 2023-08-09 DIAGNOSIS — G8929 Other chronic pain: Secondary | ICD-10-CM | POA: Diagnosis present

## 2023-08-09 DIAGNOSIS — Z9981 Dependence on supplemental oxygen: Secondary | ICD-10-CM

## 2023-08-09 DIAGNOSIS — Z681 Body mass index (BMI) 19 or less, adult: Secondary | ICD-10-CM

## 2023-08-09 DIAGNOSIS — R29810 Facial weakness: Secondary | ICD-10-CM | POA: Diagnosis present

## 2023-08-09 DIAGNOSIS — Z9221 Personal history of antineoplastic chemotherapy: Secondary | ICD-10-CM

## 2023-08-09 DIAGNOSIS — C3491 Malignant neoplasm of unspecified part of right bronchus or lung: Secondary | ICD-10-CM | POA: Diagnosis present

## 2023-08-09 DIAGNOSIS — R0902 Hypoxemia: Secondary | ICD-10-CM | POA: Diagnosis not present

## 2023-08-09 DIAGNOSIS — Z923 Personal history of irradiation: Secondary | ICD-10-CM

## 2023-08-09 DIAGNOSIS — D509 Iron deficiency anemia, unspecified: Secondary | ICD-10-CM | POA: Diagnosis present

## 2023-08-09 DIAGNOSIS — J9622 Acute and chronic respiratory failure with hypercapnia: Secondary | ICD-10-CM | POA: Diagnosis present

## 2023-08-09 DIAGNOSIS — I272 Pulmonary hypertension, unspecified: Secondary | ICD-10-CM | POA: Diagnosis present

## 2023-08-09 DIAGNOSIS — I10 Essential (primary) hypertension: Secondary | ICD-10-CM | POA: Diagnosis present

## 2023-08-09 DIAGNOSIS — Z85118 Personal history of other malignant neoplasm of bronchus and lung: Secondary | ICD-10-CM

## 2023-08-09 DIAGNOSIS — F32A Depression, unspecified: Secondary | ICD-10-CM | POA: Diagnosis present

## 2023-08-09 DIAGNOSIS — R519 Headache, unspecified: Secondary | ICD-10-CM

## 2023-08-09 DIAGNOSIS — I2510 Atherosclerotic heart disease of native coronary artery without angina pectoris: Secondary | ICD-10-CM | POA: Diagnosis present

## 2023-08-09 DIAGNOSIS — Z87891 Personal history of nicotine dependence: Secondary | ICD-10-CM

## 2023-08-09 DIAGNOSIS — J440 Chronic obstructive pulmonary disease with acute lower respiratory infection: Secondary | ICD-10-CM | POA: Diagnosis present

## 2023-08-09 DIAGNOSIS — H02402 Unspecified ptosis of left eyelid: Secondary | ICD-10-CM | POA: Diagnosis present

## 2023-08-09 DIAGNOSIS — I252 Old myocardial infarction: Secondary | ICD-10-CM

## 2023-08-09 DIAGNOSIS — Z86011 Personal history of benign neoplasm of the brain: Secondary | ICD-10-CM

## 2023-08-09 DIAGNOSIS — Z7982 Long term (current) use of aspirin: Secondary | ICD-10-CM

## 2023-08-09 DIAGNOSIS — G43909 Migraine, unspecified, not intractable, without status migrainosus: Secondary | ICD-10-CM | POA: Diagnosis present

## 2023-08-09 DIAGNOSIS — D649 Anemia, unspecified: Secondary | ICD-10-CM | POA: Diagnosis present

## 2023-08-09 DIAGNOSIS — J441 Chronic obstructive pulmonary disease with (acute) exacerbation: Principal | ICD-10-CM | POA: Diagnosis present

## 2023-08-09 DIAGNOSIS — Z1152 Encounter for screening for COVID-19: Secondary | ICD-10-CM

## 2023-08-09 DIAGNOSIS — Z8041 Family history of malignant neoplasm of ovary: Secondary | ICD-10-CM

## 2023-08-09 DIAGNOSIS — R4781 Slurred speech: Secondary | ICD-10-CM | POA: Diagnosis present

## 2023-08-09 DIAGNOSIS — Z801 Family history of malignant neoplasm of trachea, bronchus and lung: Secondary | ICD-10-CM

## 2023-08-09 DIAGNOSIS — Z888 Allergy status to other drugs, medicaments and biological substances status: Secondary | ICD-10-CM

## 2023-08-09 DIAGNOSIS — K219 Gastro-esophageal reflux disease without esophagitis: Secondary | ICD-10-CM | POA: Diagnosis present

## 2023-08-09 DIAGNOSIS — E43 Unspecified severe protein-calorie malnutrition: Secondary | ICD-10-CM | POA: Diagnosis present

## 2023-08-09 DIAGNOSIS — G9341 Metabolic encephalopathy: Secondary | ICD-10-CM

## 2023-08-09 DIAGNOSIS — Z8711 Personal history of peptic ulcer disease: Secondary | ICD-10-CM

## 2023-08-09 LAB — COMPREHENSIVE METABOLIC PANEL WITH GFR
ALT: 11 U/L (ref 0–44)
AST: 17 U/L (ref 15–41)
Albumin: 3.8 g/dL (ref 3.5–5.0)
Alkaline Phosphatase: 231 U/L — ABNORMAL HIGH (ref 38–126)
Anion gap: 8 (ref 5–15)
BUN: 15 mg/dL (ref 8–23)
CO2: 34 mmol/L — ABNORMAL HIGH (ref 22–32)
Calcium: 8.8 mg/dL — ABNORMAL LOW (ref 8.9–10.3)
Chloride: 95 mmol/L — ABNORMAL LOW (ref 98–111)
Creatinine, Ser: 0.62 mg/dL (ref 0.44–1.00)
GFR, Estimated: >60 mL/min (ref >60)
Glucose, Bld: 139 mg/dL — ABNORMAL HIGH (ref 70–99)
Potassium: 4.2 mmol/L (ref 3.5–5.1)
Sodium: 137 mmol/L (ref 135–145)
Total Bilirubin: 0.3 mg/dL (ref 0.0–1.2)
Total Protein: 7.4 g/dL (ref 6.5–8.1)

## 2023-08-09 LAB — DIFFERENTIAL
Abs Immature Granulocytes: 0.03 10*3/uL (ref 0.00–0.07)
Basophils Absolute: 0 10*3/uL (ref 0.0–0.1)
Basophils Relative: 1 %
Eosinophils Absolute: 0.2 10*3/uL (ref 0.0–0.5)
Eosinophils Relative: 3 %
Immature Granulocytes: 1 %
Lymphocytes Relative: 10 %
Lymphs Abs: 0.6 10*3/uL — ABNORMAL LOW (ref 0.7–4.0)
Monocytes Absolute: 0.5 10*3/uL (ref 0.1–1.0)
Monocytes Relative: 9 %
Neutro Abs: 4.6 10*3/uL (ref 1.7–7.7)
Neutrophils Relative %: 76 %

## 2023-08-09 LAB — TROPONIN I (HIGH SENSITIVITY): Troponin I (High Sensitivity): 5 ng/L (ref <18)

## 2023-08-09 LAB — LACTIC ACID, PLASMA: Lactic Acid, Venous: 0.7 mmol/L (ref 0.5–1.9)

## 2023-08-09 LAB — CBC
HCT: 29.4 % — ABNORMAL LOW (ref 36.0–46.0)
Hemoglobin: 8.9 g/dL — ABNORMAL LOW (ref 12.0–15.0)
MCH: 24.3 pg — ABNORMAL LOW (ref 26.0–34.0)
MCHC: 30.3 g/dL (ref 30.0–36.0)
MCV: 80.1 fL (ref 80.0–100.0)
Platelets: 258 10*3/uL (ref 150–400)
RBC: 3.67 MIL/uL — ABNORMAL LOW (ref 3.87–5.11)
RDW: 18.6 % — ABNORMAL HIGH (ref 11.5–15.5)
WBC: 6 10*3/uL (ref 4.0–10.5)
nRBC: 0.3 % — ABNORMAL HIGH (ref 0.0–0.2)

## 2023-08-09 LAB — CBG MONITORING, ED: Glucose-Capillary: 114 mg/dL — ABNORMAL HIGH (ref 70–99)

## 2023-08-09 LAB — RESP PANEL BY RT-PCR (RSV, FLU A&B, COVID)  RVPGX2
Influenza A by PCR: NEGATIVE
Influenza B by PCR: NEGATIVE
Resp Syncytial Virus by PCR: NEGATIVE
SARS Coronavirus 2 by RT PCR: NEGATIVE

## 2023-08-09 LAB — PROTIME-INR
INR: 1.1 (ref 0.8–1.2)
Prothrombin Time: 14 s (ref 11.4–15.2)

## 2023-08-09 LAB — APTT: aPTT: 37 s — ABNORMAL HIGH (ref 24–36)

## 2023-08-09 LAB — ETHANOL: Alcohol, Ethyl (B): <15 mg/dL (ref <15)

## 2023-08-09 MED ORDER — ALBUTEROL SULFATE (2.5 MG/3ML) 0.083% IN NEBU
INHALATION_SOLUTION | RESPIRATORY_TRACT | Status: AC
  Administered 2023-08-09: 2.5 mg
  Filled 2023-08-09: qty 3

## 2023-08-09 MED ORDER — IPRATROPIUM-ALBUTEROL 0.5-2.5 (3) MG/3ML IN SOLN
RESPIRATORY_TRACT | Status: AC
  Administered 2023-08-09: 3 mL
  Filled 2023-08-09: qty 3

## 2023-08-09 MED ORDER — ALBUTEROL SULFATE (2.5 MG/3ML) 0.083% IN NEBU
INHALATION_SOLUTION | RESPIRATORY_TRACT | Status: AC
Start: 2023-08-09 — End: 2023-08-09
  Administered 2023-08-09: 2.5 mg
  Filled 2023-08-09: qty 3

## 2023-08-09 MED ORDER — SODIUM CHLORIDE 0.9 % IV SOLN
1.0000 g | Freq: Once | INTRAVENOUS | Status: AC
  Administered 2023-08-09: 1 g via INTRAVENOUS
  Filled 2023-08-09: qty 10

## 2023-08-09 MED ORDER — KETOROLAC TROMETHAMINE 30 MG/ML IJ SOLN
30.0000 mg | Freq: Once | INTRAMUSCULAR | Status: AC
  Administered 2023-08-09: 30 mg via INTRAVENOUS
  Filled 2023-08-09: qty 1

## 2023-08-09 MED ORDER — METOCLOPRAMIDE HCL 5 MG/ML IJ SOLN
10.0000 mg | Freq: Once | INTRAMUSCULAR | Status: AC
  Administered 2023-08-09: 10 mg via INTRAVENOUS
  Filled 2023-08-09: qty 2

## 2023-08-09 MED ORDER — METHYLPREDNISOLONE SODIUM SUCC 125 MG IJ SOLR
125.0000 mg | Freq: Once | INTRAMUSCULAR | Status: AC
  Administered 2023-08-09: 125 mg via INTRAVENOUS
  Filled 2023-08-09: qty 2

## 2023-08-09 MED ORDER — IPRATROPIUM-ALBUTEROL 0.5-2.5 (3) MG/3ML IN SOLN
3.0000 mL | Freq: Once | RESPIRATORY_TRACT | Status: AC
  Administered 2023-08-09: 3 mL via RESPIRATORY_TRACT
  Filled 2023-08-09: qty 3

## 2023-08-09 MED ORDER — DIPHENHYDRAMINE HCL 50 MG/ML IJ SOLN
12.5000 mg | Freq: Once | INTRAMUSCULAR | Status: AC
  Administered 2023-08-09: 12.5 mg via INTRAVENOUS
  Filled 2023-08-09: qty 1

## 2023-08-09 MED ORDER — SODIUM CHLORIDE 0.9 % IV SOLN
500.0000 mg | INTRAVENOUS | Status: DC
  Administered 2023-08-09: 500 mg via INTRAVENOUS
  Filled 2023-08-09: qty 5

## 2023-08-09 NOTE — ED Notes (Signed)
 Patient being assessed by Dr. Merlyn Starring with [facility]

## 2023-08-09 NOTE — Progress Notes (Addendum)
 2028 - Code Stroke Activated   LKWT this morning initially told to be 1900. mRS 2. Patient has L facial droop, slurred speech, & L eye drooping. On further questioning once Neuro joined cart patient says all these symptoms are baseline for her post brain tumor resection.  2028 - Patient to CT   2029 - Tele-Neurologist paged, Dr. Scarlette Currier in CT assessing patient   2035 - Dr. Eugene Hertz joined cart   2044 - Dr. Eugene Hertz given CT head results on camera   2052 - Patient leaving CT   No TNK due to LKWT >4.5 hours

## 2023-08-09 NOTE — ED Triage Notes (Signed)
 Patient from home for shortness of breath, weakness, and L side facial droop. EMS reports patient woke up from a nap at 1900 and was normal per son; Courtney Zuniga around 1930. Patient reports headache all over that started this morning, increased sleeping. Denies any recent falls or blood thinners. Patient wears 4L O2 via [place] at baseline due to lung CA. Upon arrival to ER, patient is alert and oriented, able to move all extremities; L eye closed with L sided facial spasms. ER MD at bedside on arrival, called stroke alert at 2028

## 2023-08-09 NOTE — ED Notes (Signed)
 While interviewing patient with telestroke MD, patient reports that she had a brain tumor removed in 2005, and has since then had issues with speech and her L side face spasms, like what she is currently experiencing. Patient reports only new symptoms are the headache and shortness of breath

## 2023-08-09 NOTE — ED Notes (Signed)
 Portable xray at bedside.

## 2023-08-09 NOTE — Consult Note (Signed)
 [facility]   Patient Name:   Courtney Zuniga, Courtney Zuniga Date of Birth:   1950-06-20 Identification Number:   MRN - [id number] Date of Service:   08/09/2023 20:29:44  Impression:      73 year old female with COPD, hypertension, CAD and NSTEMI, history of lung cancer, and a reported brain tumor in 2005 that was treated at [facility], which left her with residual dysarthria and left eye ptosis. She presents with a day-long headache and increased shortness of breath. Etiology is most consistent with tension headache and possible COPD exacerbation. All current neurologic deficits confirmed by patient to be chronic due to a brain tumor in 2005. Head CT is negative for acute abnormalities.    PLAN  - IV migraine cocktail for headache  - if persistent headache with no improvement, consider admission for MRI brain w/o contrast  - no further emergent brain imaging needed at this time  ------------------------------------------------------------------------------  Advanced Imaging: Advanced Imaging Deferred because:  Non-disabling symptoms as verified by the patient; no cortical signs so not consistent with LVO   Metrics: Last Known Well: Unknown Dispatch Time: 08/09/2023 20:29:44 Arrival Time: 08/09/2023 20:25:00 Initial Response Time: 08/09/2023 20:31:59 Symptoms: headache. Initial patient interaction: 08/09/2023 20:43:56 NIHSS Assessment Completed: 08/09/2023 20:53:07 Patient is not a candidate for Thrombolytic. Thrombolytic Medical Decision: 08/09/2023 20:50:11 Patient was not deemed candidate for Thrombolytic because of following reasons: other diagnosis suspected tension headache, neuro deficits are all chronic.  CT head showed no acute hemorrhage or acute core infarct.  Primary Provider Notified of Diagnostic Impression and Management Plan on: 08/09/2023  21:09:37    ------------------------------------------------------------------------------  History of Present Illness: Patient is a 73 year old Female.  Patient was brought by EMS for symptoms of headache. 73 year old female with COPD, hypertension, episodic headaches, CAD and NSTEMI, history of lung cancer, and a reported history of brain tumor status post treatment in 2005 that left her with residual slurred speech and left-sided ptosis. She presents with headache and shortness of breath. Patient woke up this morning and with a headache which has since persisted all day. Sometime this afternoon she also developed increased difficulty breathing so she called EMS. EMS activated a stroke alert due to her slurred speech and left eyelid droop. On my exam, Stroke Scale is three, notable for not knowing the current month as well as significant dysarthria. She also has a fluctuating left eye ptosis with conjugate eye movements. Patient affirms that her speech and left eye issues have been there since 2005.   Past Medical History:      Hypertension      Coronary Artery Disease lung cancer  No Anticoagulant use  No Antiplatelet use Reviewed EMR for current medications  Allergies:  Reviewed  Social History: Drug Use: No  Family History: There is no family history of premature cerebrovascular disease pertinent to this consultation  ROS : 14 Points Review of Systems was performed and was negative except mentioned in HPI.    Examination: 1A: Level of Consciousness - Alert; keenly responsive + 0 1B: Ask Month and Age - 1 Question Right + 1 1C: Blink Eyes & Squeeze Hands - Performs Both Tasks + 0 2: Test Horizontal Extraocular Movements - Normal + 0 3: Test Visual Fields - No Visual Loss + 0 4: Test Facial Palsy (Use Grimace if Obtunded) - Normal symmetry + 0 5A: Test Left Arm Motor Drift - No Drift for 10 Seconds + 0 5B: Test Right Arm Motor Drift - No Drift for 10  Seconds + 0 6A:  Test Left Leg Motor Drift - No Drift for 5 Seconds + 0 6B: Test Right Leg Motor Drift - No Drift for 5 Seconds + 0 7: Test Limb Ataxia (FNF/Heel-Shin) - No Ataxia + 0 8: Test Sensation - Normal; No sensory loss + 0 9: Test Language/Aphasia - Normal; No aphasia + 0 10: Test Dysarthria - Severe Dysarthria: Unintelligble Slurring or Out of Proportion to Aphasia + 2 11: Test Extinction/Inattention - No abnormality + 0  NIHSS Score: 3   Pre-Morbid Modified Rankin Scale: 1 Points = No significant disability despite symptoms; able to carry out all usual duties and activities  Spoke with : er md I reviewed the available imaging via [facility] and initiated discussion with the primary provider  This consult was conducted in real time using interactive audio and Immunologist. Patient was informed of the technology being used for this visit and agreed to proceed. Patient located in hospital and provider located at home/office setting.   Patient is being evaluated for possible acute neurologic impairment and high probability of imminent or life-threatening deterioration. I spent total of 20 minutes providing care to this patient, including time for face to face visit via telemedicine, review of medical records, imaging studies and discussion of findings with providers, the patient and/or family.   Dr Townsend Freud   [facility] For Inpatient follow-up with [facility] physician please call [facility] at [phone]. As we are not an outpatient service for any post hospital discharge needs please contact the hospital for assistance. If you have any questions for the [facility] physicians or need to reconsult for clinical or diagnostic changes please contact us  via [facility] at [phone].

## 2023-08-10 DIAGNOSIS — G9341 Metabolic encephalopathy: Secondary | ICD-10-CM | POA: Diagnosis present

## 2023-08-10 DIAGNOSIS — I252 Old myocardial infarction: Secondary | ICD-10-CM | POA: Diagnosis not present

## 2023-08-10 DIAGNOSIS — B9789 Other viral agents as the cause of diseases classified elsewhere: Secondary | ICD-10-CM | POA: Diagnosis present

## 2023-08-10 DIAGNOSIS — F32A Depression, unspecified: Secondary | ICD-10-CM | POA: Diagnosis present

## 2023-08-10 DIAGNOSIS — J189 Pneumonia, unspecified organism: Secondary | ICD-10-CM | POA: Diagnosis present

## 2023-08-10 DIAGNOSIS — I2510 Atherosclerotic heart disease of native coronary artery without angina pectoris: Secondary | ICD-10-CM | POA: Diagnosis present

## 2023-08-10 DIAGNOSIS — J441 Chronic obstructive pulmonary disease with (acute) exacerbation: Secondary | ICD-10-CM | POA: Diagnosis present

## 2023-08-10 DIAGNOSIS — G8929 Other chronic pain: Secondary | ICD-10-CM | POA: Diagnosis present

## 2023-08-10 DIAGNOSIS — G43909 Migraine, unspecified, not intractable, without status migrainosus: Secondary | ICD-10-CM | POA: Diagnosis present

## 2023-08-10 DIAGNOSIS — R29810 Facial weakness: Secondary | ICD-10-CM | POA: Diagnosis present

## 2023-08-10 DIAGNOSIS — I272 Pulmonary hypertension, unspecified: Secondary | ICD-10-CM | POA: Diagnosis present

## 2023-08-10 DIAGNOSIS — C3491 Malignant neoplasm of unspecified part of right bronchus or lung: Secondary | ICD-10-CM | POA: Diagnosis not present

## 2023-08-10 DIAGNOSIS — E43 Unspecified severe protein-calorie malnutrition: Secondary | ICD-10-CM | POA: Diagnosis present

## 2023-08-10 DIAGNOSIS — I10 Essential (primary) hypertension: Secondary | ICD-10-CM

## 2023-08-10 DIAGNOSIS — Z7982 Long term (current) use of aspirin: Secondary | ICD-10-CM | POA: Diagnosis not present

## 2023-08-10 DIAGNOSIS — Z9981 Dependence on supplemental oxygen: Secondary | ICD-10-CM | POA: Diagnosis not present

## 2023-08-10 DIAGNOSIS — J9621 Acute and chronic respiratory failure with hypoxia: Secondary | ICD-10-CM | POA: Diagnosis present

## 2023-08-10 DIAGNOSIS — Z87891 Personal history of nicotine dependence: Secondary | ICD-10-CM | POA: Diagnosis not present

## 2023-08-10 DIAGNOSIS — J9622 Acute and chronic respiratory failure with hypercapnia: Secondary | ICD-10-CM | POA: Diagnosis present

## 2023-08-10 DIAGNOSIS — D509 Iron deficiency anemia, unspecified: Secondary | ICD-10-CM | POA: Diagnosis present

## 2023-08-10 DIAGNOSIS — Z888 Allergy status to other drugs, medicaments and biological substances status: Secondary | ICD-10-CM | POA: Diagnosis not present

## 2023-08-10 DIAGNOSIS — H02402 Unspecified ptosis of left eyelid: Secondary | ICD-10-CM | POA: Diagnosis present

## 2023-08-10 DIAGNOSIS — Z681 Body mass index (BMI) 19 or less, adult: Secondary | ICD-10-CM | POA: Diagnosis not present

## 2023-08-10 DIAGNOSIS — J440 Chronic obstructive pulmonary disease with acute lower respiratory infection: Secondary | ICD-10-CM | POA: Diagnosis present

## 2023-08-10 DIAGNOSIS — R0902 Hypoxemia: Secondary | ICD-10-CM | POA: Diagnosis present

## 2023-08-10 DIAGNOSIS — Z1152 Encounter for screening for COVID-19: Secondary | ICD-10-CM | POA: Diagnosis not present

## 2023-08-10 LAB — CBC
HCT: 27.9 % — ABNORMAL LOW (ref 36.0–46.0)
Hemoglobin: 8.6 g/dL — ABNORMAL LOW (ref 12.0–15.0)
MCH: 24.7 pg — ABNORMAL LOW (ref 26.0–34.0)
MCHC: 30.8 g/dL (ref 30.0–36.0)
MCV: 80.2 fL (ref 80.0–100.0)
Platelets: 261 10*3/uL (ref 150–400)
RBC: 3.48 MIL/uL — ABNORMAL LOW (ref 3.87–5.11)
RDW: 19.1 % — ABNORMAL HIGH (ref 11.5–15.5)
WBC: 7 10*3/uL (ref 4.0–10.5)
nRBC: 0 % (ref 0.0–0.2)

## 2023-08-10 LAB — BASIC METABOLIC PANEL WITH GFR
Anion gap: 9 (ref 5–15)
BUN: 16 mg/dL (ref 8–23)
CO2: 34 mmol/L — ABNORMAL HIGH (ref 22–32)
Calcium: 8.9 mg/dL (ref 8.9–10.3)
Chloride: 99 mmol/L (ref 98–111)
Creatinine, Ser: 0.56 mg/dL (ref 0.44–1.00)
GFR, Estimated: >60 mL/min (ref >60)
Glucose, Bld: 116 mg/dL — ABNORMAL HIGH (ref 70–99)
Potassium: 4.6 mmol/L (ref 3.5–5.1)
Sodium: 142 mmol/L (ref 135–145)

## 2023-08-10 LAB — BLOOD GAS, VENOUS
Acid-Base Excess: 11 mmol/L — ABNORMAL HIGH (ref 0.0–2.0)
Acid-Base Excess: 12.4 mmol/L — ABNORMAL HIGH (ref 0.0–2.0)
Bicarbonate: 40.8 mmol/L — ABNORMAL HIGH (ref 20.0–28.0)
Bicarbonate: 40.8 mmol/L — ABNORMAL HIGH (ref 20.0–28.0)
Drawn by: 1557
Drawn by: 7012
O2 Saturation: 87 %
O2 Saturation: 51.8 %
Patient temperature: 37
Patient temperature: 36.7
pCO2, Ven: 81 mmHg (ref 44–60)
pCO2, Ven: 80 mmHg (ref 44–60)
pH, Ven: 7.31 (ref 7.25–7.43)
pH, Ven: 7.31 (ref 7.25–7.43)
pO2, Ven: 49 mmHg — ABNORMAL HIGH (ref 32–45)
pO2, Ven: <31 mmHg — CL (ref 32–45)

## 2023-08-10 LAB — RESPIRATORY PANEL BY PCR

## 2023-08-10 LAB — MAGNESIUM: Magnesium: 1.8 mg/dL (ref 1.7–2.4)

## 2023-08-10 LAB — BRAIN NATRIURETIC PEPTIDE: B Natriuretic Peptide: 354 pg/mL — ABNORMAL HIGH (ref 0.0–100.0)

## 2023-08-10 LAB — LACTIC ACID, PLASMA: Lactic Acid, Venous: 0.8 mmol/L (ref 0.5–1.9)

## 2023-08-10 LAB — MRSA NEXT GEN BY PCR, NASAL: MRSA by PCR Next Gen: NOT DETECTED

## 2023-08-10 LAB — TROPONIN I (HIGH SENSITIVITY): Troponin I (High Sensitivity): 6 ng/L (ref <18)

## 2023-08-10 LAB — PHOSPHORUS: Phosphorus: 4.2 mg/dL (ref 2.5–4.6)

## 2023-08-10 LAB — PROCALCITONIN: Procalcitonin: <0.1 ng/mL

## 2023-08-10 MED ORDER — ALBUTEROL SULFATE (2.5 MG/3ML) 0.083% IN NEBU: 2.5000 mg | INHALATION_SOLUTION | RESPIRATORY_TRACT | Status: DC | PRN

## 2023-08-10 MED ORDER — BUDESON-GLYCOPYRROL-FORMOTEROL 160-9-4.8 MCG/ACT IN AERO
2.0000 | INHALATION_SPRAY | Freq: Two times a day (BID) | RESPIRATORY_TRACT | Status: DC
  Filled 2023-08-10: qty 5.9

## 2023-08-10 MED ORDER — ENOXAPARIN SODIUM 40 MG/0.4ML IJ SOSY
40.0000 mg | PREFILLED_SYRINGE | INTRAMUSCULAR | Status: DC
  Administered 2023-08-10 – 2023-08-12 (×3): 40 mg via SUBCUTANEOUS
  Filled 2023-08-10 (×3): qty 0.4

## 2023-08-10 MED ORDER — SODIUM CHLORIDE 0.9% FLUSH
3.0000 mL | Freq: Two times a day (BID) | INTRAVENOUS | Status: DC
  Administered 2023-08-10 – 2023-08-12 (×5): 3 mL via INTRAVENOUS

## 2023-08-10 MED ORDER — AZITHROMYCIN 250 MG PO TABS: 500.0000 mg | ORAL_TABLET | Freq: Every day | ORAL | Status: DC

## 2023-08-10 MED ORDER — IPRATROPIUM-ALBUTEROL 0.5-2.5 (3) MG/3ML IN SOLN
3.0000 mL | Freq: Four times a day (QID) | RESPIRATORY_TRACT | Status: DC
  Administered 2023-08-10 – 2023-08-12 (×9): 3 mL via RESPIRATORY_TRACT
  Filled 2023-08-10 (×9): qty 3

## 2023-08-10 MED ORDER — POLYETHYLENE GLYCOL 3350 17 G PO PACK: 17.0000 g | PACK | Freq: Every day | ORAL | Status: DC | PRN

## 2023-08-10 MED ORDER — PANTOPRAZOLE SODIUM 40 MG PO TBEC: 40.0000 mg | DELAYED_RELEASE_TABLET | Freq: Two times a day (BID) | ORAL | Status: DC

## 2023-08-10 MED ORDER — SODIUM CHLORIDE 0.9 % IV SOLN
500.0000 mg | INTRAVENOUS | Status: DC
  Administered 2023-08-10: 500 mg via INTRAVENOUS
  Filled 2023-08-10: qty 5

## 2023-08-10 MED ORDER — BUDESONIDE 0.25 MG/2ML IN SUSP
0.2500 mg | Freq: Two times a day (BID) | RESPIRATORY_TRACT | Status: DC
  Administered 2023-08-10 – 2023-08-12 (×5): 0.25 mg via RESPIRATORY_TRACT
  Filled 2023-08-10 (×5): qty 2

## 2023-08-10 MED ORDER — ALBUTEROL SULFATE (2.5 MG/3ML) 0.083% IN NEBU
2.5000 mg | INHALATION_SOLUTION | RESPIRATORY_TRACT | Status: DC | PRN
  Administered 2023-08-12: 2.5 mg via RESPIRATORY_TRACT
  Filled 2023-08-10: qty 3

## 2023-08-10 MED ORDER — SODIUM CHLORIDE 0.9 % IV SOLN
1.0000 g | INTRAVENOUS | Status: DC
  Administered 2023-08-10 – 2023-08-11 (×2): 1 g via INTRAVENOUS
  Filled 2023-08-10 (×2): qty 10

## 2023-08-10 MED ORDER — ONDANSETRON HCL 4 MG/2ML IJ SOLN
4.0000 mg | Freq: Four times a day (QID) | INTRAMUSCULAR | Status: DC | PRN
  Administered 2023-08-10: 4 mg via INTRAVENOUS
  Filled 2023-08-10: qty 2

## 2023-08-10 MED ORDER — METHYLPREDNISOLONE SODIUM SUCC 125 MG IJ SOLR
60.0000 mg | Freq: Two times a day (BID) | INTRAMUSCULAR | Status: DC
  Administered 2023-08-10 – 2023-08-12 (×5): 60 mg via INTRAVENOUS
  Filled 2023-08-10 (×5): qty 2

## 2023-08-10 MED ORDER — CHLORHEXIDINE GLUCONATE CLOTH 2 % EX PADS
6.0000 | MEDICATED_PAD | Freq: Every day | CUTANEOUS | Status: DC
  Administered 2023-08-10 – 2023-08-12 (×3): 6 via TOPICAL

## 2023-08-10 MED ORDER — MELATONIN 3 MG PO TABS
6.0000 mg | ORAL_TABLET | Freq: Every evening | ORAL | Status: DC | PRN
  Administered 2023-08-10: 6 mg via ORAL
  Filled 2023-08-10: qty 2

## 2023-08-10 MED ORDER — PANTOPRAZOLE SODIUM 40 MG IV SOLR
40.0000 mg | Freq: Every day | INTRAVENOUS | Status: DC
  Administered 2023-08-10 – 2023-08-11 (×2): 40 mg via INTRAVENOUS
  Filled 2023-08-10: qty 10

## 2023-08-10 MED ORDER — TAMSULOSIN HCL 0.4 MG PO CAPS
0.4000 mg | ORAL_CAPSULE | Freq: Every day | ORAL | Status: DC
  Administered 2023-08-11: 0.4 mg via ORAL
  Filled 2023-08-10: qty 1

## 2023-08-10 MED ORDER — OXYCODONE HCL 5 MG PO TABS: 5.0000 mg | ORAL_TABLET | Freq: Four times a day (QID) | ORAL | Status: DC | PRN

## 2023-08-10 MED ORDER — ACETAMINOPHEN 500 MG PO TABS
1000.0000 mg | ORAL_TABLET | Freq: Four times a day (QID) | ORAL | Status: DC | PRN
  Administered 2023-08-10 – 2023-08-12 (×5): 1000 mg via ORAL
  Filled 2023-08-10 (×5): qty 2

## 2023-08-10 NOTE — Progress Notes (Signed)
 Patient called and said she feels sick on her stomach and feels like throwing up from the pressure of BIPAP machine, this RN took the BIPAP off and put the patient in 4 litre [place] , gave her PRN Zofran  as per patient request, asked patient to be back on BIPAP if possible after her sickness gets better and see how that does, patient agreeable, will notify RT and will continue to monitor.

## 2023-08-10 NOTE — Telephone Encounter (Signed)
 FYI pt currently admitted with COPD exacerbation

## 2023-08-10 NOTE — Plan of Care (Signed)

## 2023-08-10 NOTE — Progress Notes (Addendum)
  Progress Note   PatientDelaini Zuniga MVH:[id number] DOB: 1950-10-05 DOA: 08/09/2023     0 DOS: the patient was seen and examined on 08/10/2023   Brief hospital course: Mrs. Ran was admitted to the hospital with the working diagnosis of COPD exacerbation.   73 yo female with the past medical history of stage 4 lung cancer, COPD, coronary artery disease, anemia and resected brain tumor in 2005 who presented with dyspnea, left sided weakness, and left facial drop. Reported recently worsening of dyspnea. On her initial physical examination her blood pressure was 105/64, HR 88, RR 28 and 02 saturation 93%, lungs with increased work of breathing, poor air movement and diffuse wheezing, heart with S1 and S2 present and regular, abdomen with no distention and no lower extremity edema.  Noted to be awake and intermittent episodes of alertness, positive disorientation, and left sided ptosis.   Chest radiograph with hyperinflation, no cardiomegaly, no effusion, small nodular opacities, lower lobes and upper lobes.   EKG 87 bpm, normal axis, normal intervals, qtc 447, sinus rhythm with no significant ST segment changes, negative T wave V1 to V2.   Assessment and Plan: * Community acquired bilateral lower lobe pneumonia Acute on chronic hypoxemic and hypercapnic respiratory failure.  COPD exacerbation.  (No sepsis on admission).   VBG 7,31/ 81/ 49/ 40.8/ 87%   Plan to continue non invasive mechanical ventilation, change to regular ventilation with non invasive Bipap mode.  Continue antibiotic therapy with ceftriaxone  IV and azithromycin  IV Bronchodilator therapy Inhaled and systemic corticosteroids.   Acute metabolic encephalopathy Multifactorial encephalopathy.  Patient continue NPO to prevent aspiration.  Continue neuro checks per unit protocol.  Aspiration precautions.   HTN (hypertension) Continue blood pressure monitoring  Patient can't tolerate po medications at this point in  time.   Adenocarcinoma of right lung, stage 4 Follow up with Dr Marguerita Shih 03/2023 with no signs of disease progression.  In March 2022 she had 55 cycles of nivolumab .  She has been under observation since then.         Subjective: Patient somnolent and difficult to arouse, when awake denies any chest pain, limited information due to low cognition   Physical Exam: Vitals:   08/10/23 0500 08/10/23 0727 08/10/23 0730 08/10/23 0733  BP: 131/72     Pulse: 90  90   Resp: 13  (!) 22   Temp:    98.5 F (36.9 C)  TempSrc:    Oral  SpO2: 97% 97% 96%   Weight:      Height:       Neurology somnolent and difficult to arouse. When awake responds to simple questions, not following commands, left facial ptosis ENT with mild pallor, full face mask in place Cardiovascular with S1 and S2 present and regular with no gallops, rubs or murmurs Respiratory with decreased breath sounds with no wheezing, prolonged expiratory phase Abdomen with no distention  No lower extremity edema  Data Reviewed:    Family Communication: no family at the bedside   Disposition: Status is: Inpatient Remains inpatient appropriate because: critically ill on non invasive mechanical ventilation   Planned Discharge Destination: Home     Patient critically will with worsening respiratory failure requiring non invasive mechanical ventilation.  Critical care time 60 minutes.   Author: Albertus Alt, MD 08/10/2023 7:56 AM  For on call review [email or web address].

## 2023-08-10 NOTE — Assessment & Plan Note (Addendum)
 Acute on chronic hypoxemic and hypercapnic respiratory failure.  COPD exacerbation.  (No sepsis on admission).   VBG 7,31/ 81/ 49/ 40.8/ 87%   Plan to continue non invasive mechanical ventilation, change to regular ventilation with non invasive Bipap mode.  Continue antibiotic therapy with ceftriaxone  IV and azithromycin  IV Bronchodilator therapy Inhaled and systemic corticosteroids.

## 2023-08-10 NOTE — Hospital Course (Addendum)
 Mrs. Spillman was admitted to the hospital with the working diagnosis of COPD exacerbation.   73 yo female with the past medical history of stage 4 lung cancer, COPD, coronary artery disease, anemia and resected brain tumor in 2005 who presented with dyspnea, left sided weakness, and left facial drop. Reported recently worsening of dyspnea. On her initial physical examination her blood pressure was 105/64, HR 88, RR 28 and 02 saturation 93%, lungs with increased work of breathing, poor air movement and diffuse wheezing, heart with S1 and S2 present and regular, abdomen with no distention and no lower extremity edema.  Noted to be awake and intermittent episodes of alertness, positive disorientation, and left sided ptosis.   Chest radiograph with hyperinflation, no cardiomegaly, no effusion, small nodular opacities, lower lobes and upper lobes.   EKG 87 bpm, normal axis, normal intervals, qtc 447, sinus rhythm with no significant ST segment changes, negative T wave V1 to V2.   04/29 patient responded well to continuous Bipap with improvement in her mentation and gas exchange.  Transitioned now to Bipap PRN during the day and continuous during the night.

## 2023-08-10 NOTE — Assessment & Plan Note (Signed)
 Continue blood pressure monitoring  Systolic blood pressure has been 130 mmHg range.  Resume bisoprolol .

## 2023-08-10 NOTE — TOC Initial Note (Signed)
 Transition of Care [facility]) - Initial/Assessment Note    Patient Details  Name: Courtney Zuniga MRN: [id number] Date of Birth: 29-May-1950  Transition of Care [facility]) CM/SW Contact:    Linnea Richards, LCSW Phone Number: 08/10/2023, 11:07 AM  Clinical Narrative:                  Pt admitted from home. She is known to [facility] from previous admissions. Pt has a high readmission risk score.   Pt unable to participate in assessment this AM. Spoke with pt's daughter, [name], to complete assessment.   Pts son lives with her. Pts family can offer assistance with ADLs if needed. Pt was set up with [facility] PT/OT with [facility] at the time of her previous hospital dc and she remains active with these services. Pt has a shower chair, walker, NIV, and home O2 through [employer] for DME. She is also active with Palliative care through [facility].  Family anticipating pt will return home with resumption of services above upon dc.  TOC will follow and assist as needed.   Expected Discharge Plan: Home w Home Health Services Barriers to Discharge: Continued Medical Work up   Patient Goals and CMS Choice Patient states their goals for this hospitalization and ongoing recovery are:: return home   Choice offered to / list presented to : Adult Children      Expected Discharge Plan and Services In-house Referral: Clinical Social Work   Post Acute Care Choice: Resumption of Svcs/PTA Provider Living arrangements for the past 2 months: Single Family Home                                      Prior Living Arrangements/Services Living arrangements for the past 2 months: Single Family Home Lives with:: Adult Children Patient language and need for interpreter reviewed:: Yes Do you feel safe going back to the place where you live?: Yes      Need for Family Participation in Patient Care: Yes (Comment) Care giver support system in place?: Yes (comment) Current home services: DME, Home OT, Home PT Criminal  Activity/Legal Involvement Pertinent to Current Situation/Hospitalization: No - Comment as needed  Activities of Daily Living      Permission Sought/Granted                  Emotional Assessment Appearance:: Appears stated age Attitude/Demeanor/Rapport: Unable to Assess Affect (typically observed): Unable to Assess Orientation: : Oriented to Self, Oriented to Place Alcohol / Substance Use: Not Applicable Psych Involvement: No (comment)  Admission diagnosis:  Hypoxia [R09.02] COPD exacerbation (HCC) [J44.1] CAP (community acquired pneumonia) [J18.9] Acute nonintractable headache, unspecified headache type [R51.9] Community acquired pneumonia, unspecified laterality [J18.9] Patient Active Problem List   Diagnosis Date Noted   Acute metabolic encephalopathy 08/10/2023   COPD GOLD 2 criteria 2017 08/07/2023   Chronic respiratory failure with hypoxia and hypercapnia (HCC) 08/07/2023   Lobar pneumonia (HCC) 06/03/2023   Acute on chronic hypoxic respiratory failure (HCC) 05/28/2023   Sinus tachycardia 10/13/2022   (HFpEF) heart failure with preserved ejection fraction (HCC) 10/13/2022   Aortic regurgitation 10/13/2022   Aortic stenosis 10/13/2022   Nicotine  abuse 10/13/2022   DOE (dyspnea on exertion) 10/06/2022   Acute on chronic respiratory failure with hypoxia (HCC) 08/25/2022   Pleural effusion 08/25/2022   Hypotension 06/12/2022   Iron  deficiency anemia 06/12/2022   Acute on chronic anemia 05/20/2022   History of  Gastric ulcer 05/20/2022   AVM (arteriovenous malformation) of small bowel, 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   Upper GI bleed 05/18/2022   Acute blood loss anemia 05/18/2022   Acute respiratory failure with hypoxia (HCC) 04/23/2022   Frequent PVCs 04/09/2022   Port-A-Cath in place 03/03/2022   Increased frequency of headaches 10/18/2020   COPD exacerbation (HCC) 11/05/2019   Chronic nonintractable headache    Acute on chronic respiratory failure with hypoxia and  hypercapnia (HCC) 06/16/2018   Influenza A 06/16/2018   Severe protein-calorie malnutrition (HCC) 06/16/2018   GERD (gastroesophageal reflux disease) 06/16/2018   Bilateral pneumonia 05/05/2018   Sepsis due to CAP 05/05/2018   Chronic respiratory failure with hypoxia (HCC) 05/05/2018   Encounter for antineoplastic immunotherapy 04/23/2017   Depression 05/15/2016   Adenocarcinoma of right lung, stage 4 01/10/2016   Encounter for antineoplastic chemotherapy 01/10/2016   Lung mass 01/03/2016   Arthritis    Subepithelial gastric mass    Mucosal abnormality of stomach    Abdominal pain 08/08/2015   Abnormal CT of the abdomen 08/08/2015   Hypoxia 06/26/2014   COPD with acute exacerbation (HCC) 06/26/2014   Community acquired bilateral lower lobe pneumonia    Chest pain of uncertain etiology    Asthma, chronic    Chest pain 02/16/2014   HTN (hypertension) 02/16/2014   DDD (degenerative disc disease), lumbar 02/04/2011   Lumbar herniated disc 01/09/2011   PCP:  Caresse Chant, FNP Pharmacy:   [facility] - [place], [place] - [address] [place] [address] Phone: [phone] Fax: [phone]  [facility] - [place], [place] - [address] [place] [address] Phone: [phone] Fax: [phone]     Social Drivers of Health (SDOH) Social History: SDOH Screenings   Food Insecurity: Patient Unable To Answer (08/10/2023)  Housing: Patient Unable To Answer (08/10/2023)  Transportation Needs: Patient Unable To Answer (08/10/2023)  Utilities: Patient Unable To Answer (08/10/2023)  Financial Resource Strain: Low Risk  (05/05/2018)  Physical Activity: Insufficiently Active (05/05/2018)  Social Connections: Patient Unable To Answer (08/10/2023)  Stress: No Stress Concern Present (05/05/2018)  Tobacco Use: Medium Risk (08/09/2023)   SDOH Interventions:     Readmission Risk Interventions    08/10/2023    11:03 AM 05/29/2023    1:14 PM 08/26/2022   10:43 AM  Readmission Risk Prevention Plan  Transportation Screening Complete Complete Complete  HRI or Home Care Consult Complete Complete   Social Work Consult for Recovery Care Planning/Counseling Complete Complete   Palliative Care Screening Complete Not Applicable   Medication Review Oceanographer) Complete Complete Complete  HRI or Home Care Consult   Complete  SW Recovery Care/Counseling Consult   Complete  Palliative Care Screening   Not Applicable  Skilled Nursing Facility   Not Applicable

## 2023-08-10 NOTE — Assessment & Plan Note (Signed)
 Follow up with Dr Marguerita Shih 03/2023 with no signs of disease progression.  In March 2022 she had 55 cycles of nivolumab .  She has been under observation since then.

## 2023-08-10 NOTE — Progress Notes (Signed)
 Follow up VBG  pH 7.31/ 80/40.8/51%   Clinically comfortable and tolerating well Bipap 18/8, RR is 19   Generating minute ventilation of 14 and tidal volume of around 600 ml.  Clinically is more awake and alert.   Will continue current setting and check VBG in am.

## 2023-08-10 NOTE — ED Provider Notes (Signed)
 [place] EMERGENCY DEPARTMENT AT [facility] Provider Note   CSN: [id number] Arrival date & time: 08/09/23  2025  An emergency department physician performed an initial assessment on this suspected stroke patient at 2028.  History  Chief Complaint  Patient presents with   Shortness of Breath    Courtney Zuniga is a 73 y.o. female.  Pt is a 73 yo female with pmhx significant for benign brain tumor s/p removal in 2005, htn, chronic headaches, CAD, COPD on 4L, depression, CAD, GERD, and lung cancer (currently under observation, no chemo).  Pt presented to the ED tonight as a code stroke due to left sided facial droop and slurred speech.  The pt's son said her face was normal when she went to sleep around 1900, but was drooping on the left side when she woke up around 1930.  However, pt said she's had these sx since she had the tumor removed.  Pt said she has a headache which is new.  She also has some sob.       Home Medications Prior to Admission medications   Medication Sig Start Date End Date Taking? Authorizing Provider  acetaminophen  (TYLENOL ) 325 MG tablet Take 2 tablets (650 mg total) by mouth every 6 (six) hours as needed for mild pain, fever or headache. 05/08/18   Quintella Buck, Courage, MD  albuterol  (PROVENTIL ) (2.5 MG/3ML) 0.083% nebulizer solution Take 3 mLs (2.5 mg total) by nebulization 3 (three) times daily as needed for shortness of breath or wheezing. 08/28/22   Colin Dawley, MD  albuterol  (VENTOLIN  HFA) 108 (90 Base) MCG/ACT inhaler Inhale 2 puffs into the lungs every 4 (four) hours as needed for shortness of breath. 08/28/22   Colin Dawley, MD  Aspirin -Acetaminophen -Caffeine  (GOODYS EXTRA STRENGTH PO) Take 1 packet by mouth daily as needed (headaches).    [provider]  benzonatate (TESSALON) 100 MG capsule Take 100 mg by mouth 3 (three) times daily as needed. 05/22/23   [provider]  bisoprolol  (ZEBETA ) 5 MG tablet Take 0.5 tablets  (2.5 mg total) by mouth daily. Patient taking differently: Take 2.5 mg by mouth daily as needed (high bp). 10/13/22   Mallipeddi, Vishnu P, MD  BREO ELLIPTA  200-25 MCG/ACT AEPB Inhale 1 puff into the lungs daily. 01/21/23   [provider]  Docusate Sodium  (DSS) 100 MG CAPS Take 1 capsule by mouth daily as needed (constipation). 10/14/22   [provider]  DULoxetine  (CYMBALTA ) 60 MG capsule Take 1 capsule (60 mg total) by mouth 2 (two) times daily. 08/28/22   Emokpae, Courage, MD  ENSURE (ENSURE) Take 237 mLs by mouth 3 (three) times daily between meals.    [provider]  FeFum-FePoly-FA-B Cmp-C-Biot (INTEGRA PLUS ) CAPS Take 1 capsule by mouth every morning. 08/28/22   Colin Dawley, MD  furosemide  (LASIX ) 20 MG tablet Take 1 tablet (20 mg total) by mouth daily as needed (3 pound weight gain). 10/08/22   Demaris Fillers, MD  Ipratropium-Albuterol  (COMBIVENT  RESPIMAT) 20-100 MCG/ACT AERS respimat Inhale 1 puff into the lungs every 6 (six) hours as needed for wheezing or shortness of breath. 08/28/22   Colin Dawley, MD  Multiple Vitamins-Minerals (MULTIVITAMIN WITH MINERALS) tablet Take 1 tablet by mouth daily. 08/28/22 08/28/23  Colin Dawley, MD  oxyCODONE -acetaminophen  (PERCOCET/ROXICET) 5-325 MG tablet Take 1 tablet by mouth every 8 (eight) hours as needed for moderate pain. 05/08/18   Colin Dawley, MD  pantoprazole  (PROTONIX ) 40 MG tablet Take 1 tablet (40 mg total) by mouth 2 (two)  times daily. 08/28/22 08/28/23  Colin Dawley, MD  predniSONE  (DELTASONE ) 10 MG tablet Take 6 tablets (60 mg total) by mouth daily with breakfast. And decrease by one tablet daily 06/04/23   Tat, Myrtie Atkinson, MD  tamsulosin  (FLOMAX ) 0.4 MG CAPS capsule Take 1 capsule (0.4 mg total) by mouth daily after supper. 08/28/22   Colin Dawley, MD  traZODone  (DESYREL ) 50 MG tablet Take 1 tablet (50 mg total) by mouth at bedtime. 08/28/22   Colin Dawley, MD  Vitamin D , Ergocalciferol , (DRISDOL ) 1.25 MG  (50000 UNIT) CAPS capsule Take 1 capsule (50,000 Units total) by mouth once a week. 08/28/22   Colin Dawley, MD  HYDROcodone  bit-homatropine (HYCODAN) 5-1.5 MG/5ML syrup Take 5 mLs by mouth every 6 (six) hours as needed for cough. 11/10/21   Marlene Simas, MD      Allergies    Desyrel  [trazodone ]    Review of Systems   Review of Systems  Respiratory:  Positive for shortness of breath.   Neurological:  Positive for headaches.  All other systems reviewed and are negative.   Physical Exam Updated Vital Signs BP 106/69 (BP Location: Left Arm)   Pulse 95   Temp 98.6 F (37 C) (Oral)   Resp (!) 37   Ht 5\' 6"  (1.676 m)   Wt 54.9 kg   SpO2 91%   BMI 19.53 kg/m  Physical Exam Vitals and nursing note reviewed.  Constitutional:      Appearance: She is well-developed.  HENT:     Head: Atraumatic.     Comments: Left facial droop with facial twitch    Mouth/Throat:     Mouth: Mucous membranes are moist.     Pharynx: Oropharynx is clear.  Eyes:     Extraocular Movements: Extraocular movements intact.     Pupils: Pupils are equal, round, and reactive to light.  Cardiovascular:     Rate and Rhythm: Normal rate and regular rhythm.  Pulmonary:     Effort: Tachypnea present.     Breath sounds: Wheezing present.  Abdominal:     General: Bowel sounds are normal.     Palpations: Abdomen is soft.  Musculoskeletal:        General: Normal range of motion.     Cervical back: Normal range of motion and neck supple.  Skin:    General: Skin is warm.     Capillary Refill: Capillary refill takes less than 2 seconds.  Neurological:     General: No focal deficit present.     Mental Status: She is alert and oriented to person, place, and time.  Psychiatric:        Mood and Affect: Mood normal.        Behavior: Behavior normal.     ED Results / Procedures / Treatments   Labs (all labs ordered are listed, but only abnormal results are displayed) Labs Reviewed  APTT - Abnormal;  Notable for the following components:      Result Value   aPTT 37 (*)    All other components within normal limits  CBC - Abnormal; Notable for the following components:   RBC 3.67 (*)    Hemoglobin 8.9 (*)    HCT 29.4 (*)    MCH 24.3 (*)    RDW 18.6 (*)    nRBC 0.3 (*)    All other components within normal limits  DIFFERENTIAL - Abnormal; Notable for the following components:   Lymphs Abs 0.6 (*)    All other components within normal  limits  COMPREHENSIVE METABOLIC PANEL WITH GFR - Abnormal; Notable for the following components:   Chloride 95 (*)    CO2 34 (*)    Glucose, Bld 139 (*)    Calcium  8.8 (*)    Alkaline Phosphatase 231 (*)    All other components within normal limits  CBG MONITORING, ED - Abnormal; Notable for the following components:   Glucose-Capillary 114 (*)    All other components within normal limits  RESP PANEL BY RT-PCR (RSV, FLU A&B, COVID)  RVPGX2  CULTURE, BLOOD (ROUTINE X 2)  CULTURE, BLOOD (ROUTINE X 2)  ETHANOL  PROTIME-INR  LACTIC ACID, PLASMA  LACTIC ACID, PLASMA  RAPID URINE DRUG SCREEN, HOSP PERFORMED  URINALYSIS, ROUTINE W REFLEX MICROSCOPIC  TROPONIN I (HIGH SENSITIVITY)  TROPONIN I (HIGH SENSITIVITY)    EKG EKG Interpretation Date/Time:  Sunday August 09 2023 21:10:05 EDT Ventricular Rate:  87 PR Interval:  168 QRS Duration:  83 QT Interval:  371 QTC Calculation: 447 R Axis:   -7  Text Interpretation: Sinus rhythm Low voltage, precordial leads Nonspecific T abnrm, anterolateral leads ST elevation, consider inferior injury No significant change since last tracing Confirmed by Sueellen Emery [phone]) on 08/10/2023 12:18:13 AM  Radiology DG Chest Portable 1 View Result Date: 08/09/2023 EXAM: 1 VIEW(S) XRAY OF THE CHEST 08/09/2023 09:18:00 PM COMPARISON: CT chest 08/04/2023. CLINICAL HISTORY: Shortness of breath. Patient from home for shortness of breath, weakness, and left side facial droop. EMS reports patient woke up from a nap at  1900 and was normal per son; last known normal around 1930. Denies any recent falls or blood thinners. Patient wears 4L O2 via nasal cannula at baseline due to lung cancer. Upon arrival to ER, patient is alert and oriented, able to move all extremities; left eye closed with left sided facial spasms. ER MD at bedside on arrival, called stroke alert at 2028. FINDINGS: LUNGS AND PLEURA: Stable nodular opacity in the right upper lobe, likely corresponding to the patient's known treated primary bronchogenic carcinoma, better evaluated on CT. New patchy/interstitial opacities in the mid and lower lungs bilaterally, suggesting mild infection/pneumonia. No pleural effusion. No pneumothorax. HEART AND MEDIASTINUM: No acute abnormality of the cardiac and mediastinal silhouettes. Thoracic aortic atherosclerosis. BONES AND SOFT TISSUES: No acute osseous abnormality. Right chest power port terminating at the cavoatrial junction. IMPRESSION: 1. New patchy/interstitial opacities in the mid and lower lungs bilaterally, suggesting mild infection/pneumonia. 2. Stable nodular opacity in the right upper lobe, likely corresponding to the patient's known treated primary bronchogenic carcinoma. Electronically signed by: Zadie Herter MD 08/09/2023 10:05 PM EDT RP Workstation: [id number]   CT HEAD CODE STROKE WO CONTRAST Result Date: 08/09/2023 CLINICAL DATA:  Code stroke. Initial evaluation for acute neuro deficit, stroke suspected. EXAM: CT HEAD WITHOUT CONTRAST TECHNIQUE: Contiguous axial images were obtained from the base of the skull through the vertex without intravenous contrast. RADIATION DOSE REDUCTION: This exam was performed according to the departmental dose-optimization program which includes automated exposure control, adjustment of the mA and/or kV according to patient size and/or use of iterative reconstruction technique. COMPARISON:  Prior study from 07/16/2022. FINDINGS: Brain: Cerebral volume within normal limits.  Mild chronic microvascular ischemic disease for age. No acute intracranial hemorrhage. No acute large vessel territory infarct. No mass lesion or midline shift. No hydrocephalus or extra-axial fluid collection. Vascular: No abnormal hyperdense vessel. Scattered vascular calcifications noted within the carotid siphons. Skull: Scalp soft tissues demonstrate no acute finding. Calvarium intact without fracture. Diffuse osteopenia noted.  Sinuses/Orbits: Globes orbital soft tissues within normal limits. Paranasal sinuses and mastoid air cells are largely clear. Other: None. ASPECTS [facility] Stroke Program Early CT Score) - Ganglionic level infarction (caudate, lentiform nuclei, internal capsule, insula, M1-M3 cortex): 7 - Supraganglionic infarction (M4-M6 cortex): 3 Total score (0-10 with 10 being normal): 10 IMPRESSION: 1. No acute intracranial abnormality. 2. ASPECTS is 10. 3. Mild chronic microvascular ischemic disease for age. Results were called by telephone at the time of interpretation on 08/09/2023 at 8:44 pm to provider Toula Miyasaki , who verbally acknowledged these results. Electronically Signed   By: Virgia Griffins M.D.   On: 08/09/2023 20:44    Procedures Procedures    Medications Ordered in ED Medications  azithromycin  (ZITHROMAX ) 500 mg in sodium chloride  0.9 % 250 mL IVPB (500 mg Intravenous New Bag/Given 08/09/23 2326)  ketorolac  (TORADOL ) 30 MG/ML injection 30 mg (30 mg Intravenous Given 08/09/23 2113)  metoCLOPramide  (REGLAN ) injection 10 mg (10 mg Intravenous Given 08/09/23 2114)  diphenhydrAMINE  (BENADRYL ) injection 12.5 mg (12.5 mg Intravenous Given 08/09/23 2116)  cefTRIAXone  (ROCEPHIN ) 1 g in sodium chloride  0.9 % 100 mL IVPB (0 g Intravenous Stopped 08/09/23 2320)  ipratropium-albuterol  (DUONEB) 0.5-2.5 (3) MG/3ML nebulizer solution 3 mL (3 mLs Nebulization Given 08/09/23 2306)  methylPREDNISolone  sodium succinate (SOLU-MEDROL ) 125 mg/2 mL injection 125 mg (125 mg Intravenous Given  08/09/23 2240)  albuterol  (PROVENTIL ) (2.5 MG/3ML) 0.083% nebulizer solution (2.5 mg  Given 08/09/23 2306)  ipratropium-albuterol  (DUONEB) 0.5-2.5 (3) MG/3ML nebulizer solution (3 mLs  Given 08/09/23 2318)  albuterol  (PROVENTIL ) (2.5 MG/3ML) 0.083% nebulizer solution (2.5 mg  Given 08/09/23 2318)    ED Course/ Medical Decision Making/ A&P                                 Medical Decision Making Amount and/or Complexity of Data Reviewed Labs: ordered. Radiology: ordered.  Risk Prescription drug management. Decision regarding hospitalization.   This patient presents to the ED for concern of stroke, this involves an extensive number of treatment options, and is a complaint that carries with it a high risk of complications and morbidity.  The differential diagnosis includes cva, tia, migraine, copd, pna, bronchitis   Co morbidities that complicate the patient evaluation  benign brain tumor s/p removal in 2005, htn, chronic headaches, CAD, COPD on 4L, depression, CAD, GERD, and lung cancer   Additional history obtained:  Additional history obtained from epic chart review External records from outside source obtained and reviewed including EMS report   Lab Tests:  I Ordered, and personally interpreted labs.  The pertinent results include:  cbc with wbc nl, hgb low at 8.9 (stable), cmp nl, etoh neg, trop nl   Imaging Studies ordered:  I ordered imaging studies including cxr, ct head  I independently visualized and interpreted imaging which showed  CXR:  New patchy/interstitial opacities in the mid and lower lungs bilaterally,  suggesting mild infection/pneumonia.  2. Stable nodular opacity in the right upper lobe, likely corresponding to the  patient's known treated primary bronchogenic carcinoma.  CT head:  No acute intracranial abnormality.  2. ASPECTS is 10.  3. Mild chronic microvascular ischemic disease for age.   I agree with the radiologist interpretation   Cardiac  Monitoring:  The patient was maintained on a cardiac monitor.  I personally viewed and interpreted the cardiac monitored which showed an underlying rhythm of: nsr   Medicines ordered and prescription drug management:  I ordered medication including reglan /benadryl /ivfs and duoneb/solumedrol/nebs for ha/sob Reevaluation of the patient after these medicines showed that the patient improved I have reviewed the patients home medicines and have made adjustments as needed   Test Considered:  ct   Critical Interventions:  Code stroke   Consultations Obtained:  I requested consultation with the neurologist (Dr. Eugene Hertz),  and discussed lab and imaging findings as well as pertinent plan - no cva as facial droop chronic Pt d/w Dr. Amy Kansky ([facility]) for admission.   Problem List / ED Course:  Code stroke:  code stroke called due to possibility of new facial droop.  However, this is chronic COPD exacerbation with pna:  nebs/steroids/abx given.  Pt's nurse got her up to ambulate and O2 sats dropped to 80s on 4L.  She required a lot of assistance to move and took awhile for O2 sats to come back up to the low 90s on 4L. Headache:  headache has improved.  Pt has c/o headache at various visits for years.     Reevaluation:  After the interventions noted above, I reevaluated the patient and found that they have :improved   Social Determinants of Health:  Lives at home   Dispostion:  After consideration of the diagnostic results and the patients response to treatment, I feel that the patent would benefit from admission.  CRITICAL CARE Performed by: Sueellen Emery   Total critical care time: 30 minutes  Critical care time was exclusive of separately billable procedures and treating other patients.  Critical care was necessary to treat or prevent imminent or life-threatening deterioration.  Critical care was time spent personally by me on the following activities: development of  treatment plan with patient and/or surrogate as well as nursing, discussions with consultants, evaluation of patient's response to treatment, examination of patient, obtaining history from patient or surrogate, ordering and performing treatments and interventions, ordering and review of laboratory studies, ordering and review of radiographic studies, pulse oximetry and re-evaluation of patient's condition.         Final Clinical Impression(s) / ED Diagnoses Final diagnoses:  COPD exacerbation (HCC)  Acute nonintractable headache, unspecified headache type  Community acquired pneumonia, unspecified laterality  Hypoxia    Rx / DC Orders ED Discharge Orders     None         Sueellen Emery, MD 08/10/23 0111

## 2023-08-10 NOTE — ED Notes (Signed)
 Pt sat on the side of the bed for a few minutes saying she was "so tired". Pt stood on her feet and became unsteady for a bit, then got her footing.  Pt made it to the door of the room and was asking if her oxygen  tubing was "bringing out air". Pt began to drop from 95-97% to 78-81%. Helped pt back to stretcher. RN informed.

## 2023-08-10 NOTE — Progress Notes (Signed)
   08/10/23 0302  BiPAP/CPAP/SIPAP  $ Non-Invasive Home Ventilator  Initial  $ Face Mask Medium Yes  BiPAP/CPAP/SIPAP Pt Type Adult  BiPAP/CPAP/SIPAP DREAMSTATIOND  Mask Type Full face mask  Dentures removed? Not applicable  Mask Size Medium  IPAP 14 cmH20  EPAP 8 cmH2O  Pressure Support 6 cmH20  Flow Rate 6 lpm  Patient Home Machine No  Patient Home Mask No  Patient Home Tubing No  Auto Titrate No  BiPAP/CPAP /SiPAP Vitals  Pulse Rate 91  Resp 20  SpO2 94 %  Bilateral Breath Sounds Expiratory wheezes  MEWS Score/Color  MEWS Score 1  MEWS Score Color Green

## 2023-08-10 NOTE — H&P (Addendum)
 History and Physical    Courtney Zuniga:[id number] DOB: Jan 18, 1951 DOA: 08/09/2023  PCP: Caresse Chant, FNP   Patient coming from: Home   Chief Complaint:  Chief Complaint  Patient presents with   Shortness of Breath    HPI:  Courtney Zuniga is a 73 y.o. 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  Presented with SOB, weakness, L facial drop. Code stroke in ED due to left-sided facial weakness, slurred speech.  See additional ED course below.  Apparently left-sided facial deficits are chronic which patient had confirmed. C/o diffuse headache. No other neurologic symptoms.  Incidentally noted to have acute on chronic hypoxia.  Patient providing limited history but reports breathing feels "terrible".  She does not provide additional history regarding respiratory complaints   Review of Systems:  ROS limited due to mental status  Allergies  Allergen Reactions   Desyrel  [Trazodone ] Other (See Comments)    "Made me faint"    Prior to Admission medications   Medication Sig Start Date End Date Taking? Authorizing Provider  acetaminophen  (TYLENOL ) 325 MG tablet Take 2 tablets (650 mg total) by mouth every 6 (six) hours as needed for mild pain, fever or headache. 05/08/18   Quintella Buck, Courage, MD  albuterol  (PROVENTIL ) (2.5 MG/3ML) 0.083% nebulizer solution Take 3 mLs (2.5 mg total) by nebulization 3 (three) times daily as needed for shortness of breath or wheezing. 08/28/22   Colin Dawley, MD  albuterol  (VENTOLIN  HFA) 108 (90 Base) MCG/ACT inhaler Inhale 2 puffs into the lungs every 4 (four) hours as needed for shortness of breath. 08/28/22   Colin Dawley, MD  Aspirin -Acetaminophen -Caffeine   (GOODYS EXTRA STRENGTH PO) Take 1 packet by mouth daily as needed (headaches).    [provider]  benzonatate (TESSALON) 100 MG capsule Take 100 mg by mouth 3 (three) times daily as needed. 05/22/23   [provider]  bisoprolol  (ZEBETA ) 5 MG tablet Take 0.5 tablets (2.5 mg total) by mouth daily. Patient taking differently: Take 2.5 mg by mouth daily as needed (high bp). 10/13/22   Mallipeddi, Vishnu P, MD  BREO ELLIPTA  200-25 MCG/ACT AEPB Inhale 1 puff into the lungs daily. 01/21/23   [provider]  Docusate Sodium  (DSS) 100 MG CAPS Take 1 capsule by mouth daily as needed (constipation). 10/14/22   [provider]  DULoxetine  (CYMBALTA ) 60 MG capsule Take 1 capsule (60 mg total) by mouth 2 (two) times daily. 08/28/22   Emokpae, Courage, MD  ENSURE (ENSURE) Take 237 mLs by mouth 3 (three) times daily between meals.    [provider]  FeFum-FePoly-FA-B Cmp-C-Biot (INTEGRA PLUS ) CAPS Take 1 capsule by mouth every morning. 08/28/22   Colin Dawley, MD  Fluticasone -Umeclidin-Vilant (TRELEGY ELLIPTA ) 100-62.5-25 MCG/ACT AEPB INHALE 1 PUFF BY MOUTH EVERY DAY    [provider]  furosemide  (LASIX ) 20 MG tablet Take 1 tablet (20 mg total) by mouth daily as needed (3 pound weight gain). 10/08/22   Demaris Fillers, MD  Ipratropium-Albuterol  (COMBIVENT  RESPIMAT) 20-100 MCG/ACT AERS respimat Inhale 1 puff into the lungs every 6 (six) hours as needed for wheezing or shortness of breath. 08/28/22   Colin Dawley, MD  Multiple Vitamins-Minerals (MULTIVITAMIN WITH MINERALS) tablet Take 1 tablet by mouth daily. 08/28/22 08/28/23  Colin Dawley, MD  oxyCODONE -acetaminophen  (PERCOCET/ROXICET) 5-325 MG tablet Take 1 tablet by mouth every 8 (eight) hours as needed for moderate pain. 05/08/18   Colin Dawley, MD  pantoprazole  (PROTONIX ) 40 MG tablet Take 1 tablet (40 mg total) by mouth 2 (two) times daily. 08/28/22 08/28/23  Colin Dawley, MD  predniSONE  (DELTASONE ) 10 MG  tablet Take 6 tablets (60 mg total) by mouth daily with breakfast. And decrease by one tablet daily 06/04/23   Tat, Myrtie Atkinson, MD  tamsulosin  (FLOMAX ) 0.4 MG CAPS capsule Take 1 capsule (0.4 mg total) by mouth daily after supper. 08/28/22   Colin Dawley, MD  traZODone  (DESYREL ) 50 MG tablet Take 1 tablet (50 mg total) by mouth at bedtime. 08/28/22   Colin Dawley, MD  Vitamin D , Ergocalciferol , (DRISDOL ) 1.25 MG (50000 UNIT) CAPS capsule Take 1 capsule (50,000 Units total) by mouth once a week. 08/28/22   Colin Dawley, MD  HYDROcodone  bit-homatropine (HYCODAN) 5-1.5 MG/5ML syrup Take 5 mLs by mouth every 6 (six) hours as needed for cough. 11/10/21   Marlene Simas, MD    Past Medical History:  Diagnosis Date   Anemia    as a young woman   Arthritis    osteoartritis   Asthma    Brain tumor (benign) (HCC) 2005 [facility]   Benign   Chronic headaches    Chronic hip pain    Chronic pain    COPD (chronic obstructive pulmonary disease) (HCC)    Coronary artery disease    Depression    Depression 05/15/2016   Encounter for antineoplastic chemotherapy 01/10/2016   GERD (gastroesophageal reflux disease)    Hypertension    Lung cancer (HCC) dx'd 01/2016   currently on chemo and radiation    NSTEMI (non-ST elevated myocardial infarction) (HCC) yrs ago   On home O2    qhs 2 liters at hs and prn   Pneumonia last time 2 yrs ago   Shortness of breath dyspnea    with activity    Past Surgical History:  Procedure Laterality Date   CHOLECYSTECTOMY     COLONOSCOPY  2015   Results requested from [facility]   COLONOSCOPY     ESOPHAGOGASTRODUODENOSCOPY N/A 08/14/2015   Procedure: ESOPHAGOGASTRODUODENOSCOPY (EGD);  Surgeon: Suzette Espy, MD;  [facility];  Service: Endoscopy;  Laterality: N/A;  215    ESOPHAGOGASTRODUODENOSCOPY (EGD) WITH PROPOFOL  N/A 09/13/2015   Procedure: ESOPHAGOGASTRODUODENOSCOPY (EGD) WITH PROPOFOL ;  Surgeon: Janel Medford, MD;  Location: [facility]  ENDOSCOPY;  Service: Endoscopy;  Laterality: N/A;   ESOPHAGOGASTRODUODENOSCOPY (EGD) WITH PROPOFOL  N/A 05/19/2022   Procedure: ESOPHAGOGASTRODUODENOSCOPY (EGD) WITH PROPOFOL ;  Surgeon: Vinetta Greening, DO;  [facility];  Service: Endoscopy;  Laterality: N/A;   EUS N/A 03/12/2017   Procedure: UPPER ENDOSCOPIC ULTRASOUND (EUS) RADIAL;  Surgeon: Janel Medford, MD;  Location: [facility];  Service: Endoscopy;  Laterality: N/A;   HOT HEMOSTASIS  05/19/2022   Procedure: HOT HEMOSTASIS (ARGON PLASMA COAGULATION/BICAP);  Surgeon: Vinetta Greening, DO;  [facility]: [facility];  Service: Endoscopy;;   IR IMAGING GUIDED PORT INSERTION  01/20/2022   TUMOR REMOVAL  2005   Benign   UPPER ESOPHAGEAL ENDOSCOPIC ULTRASOUND (EUS)  09/13/2015   Procedure: UPPER ESOPHAGEAL ENDOSCOPIC ULTRASOUND (EUS);  Surgeon: Janel Medford, MD;  Location: [facility];  Service: Endoscopy;;   VIDEO BRONCHOSCOPY WITH ENDOBRONCHIAL NAVIGATION N/A 12/31/2015   Procedure: VIDEO BRONCHOSCOPY WITH ENDOBRONCHIAL NAVIGATION;  Surgeon: Zelphia Higashi, MD;  Location: [facility] OR;  Service: Thoracic;  Laterality:  N/A;   VIDEO BRONCHOSCOPY WITH ENDOBRONCHIAL ULTRASOUND N/A 11/08/2015   Procedure: VIDEO BRONCHOSCOPY WITH ENDOBRONCHIAL ULTRASOUND;  Surgeon: Heriberto London, MD;  Location: [facility] OR;  Service: Thoracic;  Laterality: N/A;     reports that she quit smoking about 5 years ago. Her smoking use included cigarettes. She started smoking about 43 years ago. She has a 38 pack-year smoking history. She has never used smokeless tobacco. She reports that she does not drink alcohol and does not use drugs.  Family History  Problem Relation Age of Onset   Ovarian cancer Mother    Lung cancer Father    Brain cancer Sister        Half sister   Lung cancer Brother    Lung cancer Brother    Prostate cancer Brother    Colon cancer Neg Hx    Colon polyps Neg Hx      Physical Exam: Vitals:   08/10/23 0301 08/10/23 0302  08/10/23 0400 08/10/23 0500  BP: 105/64  131/84 131/72  Pulse: 88 91 94 90  Resp: 16 20 (!) 28 13  Temp: 98.3 F (36.8 C)     TempSrc: Oral     SpO2: 95% 94% 93% 97%  Weight: 54.2 kg     Height: 5\' 9"  (1.753 m)       Gen: Awake, alert, chronically ill-appearing HEENT: Very HOH Neck: JVD approximately 10 cm CV: Regular, normal S1, S2, no murmurs  Resp: Tachypneic, mild increased work of breathing, on 4 L at rest.  Poor air movement, diffuse wheezing and diminished breath sounds in bases.  Abd: Flat, normoactive, nontender MSK: Symmetric, no edema  Skin: No rashes or lesions to exposed skin  Neuro: She is awake with intermittent levels of alertness to examiner.  Sometimes does not respond to any questioning and sometimes conversational.  Oriented to self, Cristine Done, not to time or situation.  Speech slightly dysarthric, left-sided ptosis.  Left-sided facial clonic activity Psych: Unable to assess with her mental status   Data review:   Labs reviewed, notable for:   Lactate 0.8 Bicarb 34 Creatinine 0.6 up from prior Alk phos 231 High-sensitivity troponin negative WBC 6, hemoglobin 8.9, normocytic  Micro:  Results for orders placed or performed during the hospital encounter of 08/09/23  Resp panel by RT-PCR (RSV, Flu A&B, Covid) Anterior Nasal Swab     Status: None   Collection Time: 08/09/23  8:30 PM   Specimen: Anterior Nasal Swab  Result Value Ref Range Status   SARS Coronavirus 2 by RT PCR NEGATIVE NEGATIVE Final    Comment: (NOTE) SARS-CoV-2 target nucleic acids are NOT DETECTED.  The SARS-CoV-2 RNA is generally detectable in upper respiratory specimens during the acute phase of infection. The lowest concentration of SARS-CoV-2 viral copies this assay can detect is 138 copies/mL. A negative result does not preclude SARS-Cov-2 infection and should not be used as the sole basis for treatment or other patient management decisions. A negative result may occur with   improper specimen collection/handling, submission of specimen other than nasopharyngeal swab, presence of viral mutation(s) within the areas targeted by this assay, and inadequate number of viral copies(<138 copies/mL). A negative result must be combined with clinical observations, patient history, and epidemiological information. The expected result is Negative.  Fact Sheet for Patients:  BloggerCourse.com  Fact Sheet for Healthcare Providers:  SeriousBroker.it  This test is no t yet approved or cleared by the [place]  FDA and  has been authorized for detection  and/or diagnosis of SARS-CoV-2 by FDA under an Emergency Use Authorization (EUA). This EUA will remain  in effect (meaning this test can be used) for the duration of the COVID-19 declaration under Section 564(b)(1) of the Act, 21 U.S.C.section 360bbb-3(b)(1), unless the authorization is terminated  or revoked sooner.       Influenza A by PCR NEGATIVE NEGATIVE Final   Influenza B by PCR NEGATIVE NEGATIVE Final    Comment: (NOTE) The Xpert Xpress SARS-CoV-2/FLU/RSV plus assay is intended as an aid in the diagnosis of influenza from Nasopharyngeal swab specimens and should not be used as a sole basis for treatment. Nasal washings and aspirates are unacceptable for Xpert Xpress SARS-CoV-2/FLU/RSV testing.  Fact Sheet for Patients: BloggerCourse.com  Fact Sheet for Healthcare Providers: SeriousBroker.it  This test is not yet approved or cleared by the [place]  FDA and has been authorized for detection and/or diagnosis of SARS-CoV-2 by FDA under an Emergency Use Authorization (EUA). This EUA will remain in effect (meaning this test can be used) for the duration of the COVID-19 declaration under Section 564(b)(1) of the Act, 21 U.S.C. section 360bbb-3(b)(1), unless the authorization is terminated or revoked.      Resp Syncytial Virus by PCR NEGATIVE NEGATIVE Final    Comment: (NOTE) Fact Sheet for Patients: BloggerCourse.com  Fact Sheet for Healthcare Providers: SeriousBroker.it  This test is not yet approved or cleared by the [place]  FDA and has been authorized for detection and/or diagnosis of SARS-CoV-2 by FDA under an Emergency Use Authorization (EUA). This EUA will remain in effect (meaning this test can be used) for the duration of the COVID-19 declaration under Section 564(b)(1) of the Act, 21 U.S.C. section 360bbb-3(b)(1), unless the authorization is terminated or revoked.  Performed at [facility], [address] [place], [place] [address]   Culture, blood (routine x 2)     Status: None (Preliminary result)   Collection Time: 08/09/23 10:24 PM   Specimen: BLOOD RIGHT FOREARM  Result Value Ref Range Status   Specimen Description BLOOD RIGHT FOREARM  Final   Special Requests   Final    BOTTLES DRAWN AEROBIC AND ANAEROBIC Blood Culture adequate volume Performed at [facility], [address], [place], [place] [address]    Culture PENDING  Incomplete   Report Status PENDING  Incomplete  Culture, blood (routine x 2)     Status: None (Preliminary result)   Collection Time: 08/09/23 10:31 PM   Specimen: BLOOD RIGHT HAND  Result Value Ref Range Status   Specimen Description BLOOD RIGHT HAND  Final   Special Requests   Final    BOTTLES DRAWN AEROBIC AND ANAEROBIC Blood Culture adequate volume Performed at [facility], [address], [place], [place] [address]    Culture PENDING  Incomplete   Report Status PENDING  Incomplete   *Note: Due to a large number of results and/or encounters for the requested time period, some results have not been displayed. A complete set of results can be found in Results Review.    Imaging reviewed:  DG Chest Portable 1 View Result Date: 08/09/2023 EXAM: 1 VIEW(S) XRAY OF THE CHEST  08/09/2023 09:18:00 PM COMPARISON: CT chest 08/04/2023. CLINICAL HISTORY: Shortness of breath. Patient from home for shortness of breath, weakness, and left side facial droop. EMS reports patient woke up from a nap at 1900 and was normal per son; last known normal around 1930. Denies any recent falls or blood thinners. Patient wears 4L O2 via nasal cannula at baseline  due to lung cancer. Upon arrival to ER, patient is alert and oriented, able to move all extremities; left eye closed with left sided facial spasms. ER MD at bedside on arrival, called stroke alert at 2028. FINDINGS: LUNGS AND PLEURA: Stable nodular opacity in the right upper lobe, likely corresponding to the patient's known treated primary bronchogenic carcinoma, better evaluated on CT. New patchy/interstitial opacities in the mid and lower lungs bilaterally, suggesting mild infection/pneumonia. No pleural effusion. No pneumothorax. HEART AND MEDIASTINUM: No acute abnormality of the cardiac and mediastinal silhouettes. Thoracic aortic atherosclerosis. BONES AND SOFT TISSUES: No acute osseous abnormality. Right chest power port terminating at the cavoatrial junction. IMPRESSION: 1. New patchy/interstitial opacities in the mid and lower lungs bilaterally, suggesting mild infection/pneumonia. 2. Stable nodular opacity in the right upper lobe, likely corresponding to the patient's known treated primary bronchogenic carcinoma. Electronically signed by: Zadie Herter MD 08/09/2023 10:05 PM EDT RP Workstation: [id number]   CT HEAD CODE STROKE WO CONTRAST Result Date: 08/09/2023 CLINICAL DATA:  Code stroke. Initial evaluation for acute neuro deficit, stroke suspected. EXAM: CT HEAD WITHOUT CONTRAST TECHNIQUE: Contiguous axial images were obtained from the base of the skull through the vertex without intravenous contrast. RADIATION DOSE REDUCTION: This exam was performed according to the departmental dose-optimization program which includes automated  exposure control, adjustment of the mA and/or kV according to patient size and/or use of iterative reconstruction technique. COMPARISON:  Prior study from 07/16/2022. FINDINGS: Brain: Cerebral volume within normal limits. Mild chronic microvascular ischemic disease for age. No acute intracranial hemorrhage. No acute large vessel territory infarct. No mass lesion or midline shift. No hydrocephalus or extra-axial fluid collection. Vascular: No abnormal hyperdense vessel. Scattered vascular calcifications noted within the carotid siphons. Skull: Scalp soft tissues demonstrate no acute finding. Calvarium intact without fracture. Diffuse osteopenia noted. Sinuses/Orbits: Globes orbital soft tissues within normal limits. Paranasal sinuses and mastoid air cells are largely clear. Other: None. ASPECTS [facility] Stroke Program Early CT Score) - Ganglionic level infarction (caudate, lentiform nuclei, internal capsule, insula, M1-M3 cortex): 7 - Supraganglionic infarction (M4-M6 cortex): 3 Total score (0-10 with 10 being normal): 10 IMPRESSION: 1. No acute intracranial abnormality. 2. ASPECTS is 10. 3. Mild chronic microvascular ischemic disease for age. Results were called by telephone at the time of interpretation on 08/09/2023 at 8:44 pm to provider JULIE HAVILAND , who verbally acknowledged these results. Electronically Signed   By: Virgia Griffins M.D.   On: 08/09/2023 20:44   Personally reviewed chest x-ray agree with radiology interpretation for above   ED Course:  Code stroke in ED for left sided facial weakness, slurred speech. Reported as new per son. However patient reporting symptoms are chronic. Was evaluated by Teleneurology Dr. Eugene Hertz. LKWT 1900 per triage. NIHSS 3. CT Head with no acute abnormality. No TNK as suspected alternate diagnosis (tension headache, chronic neurologic deficit). No additional imaging recommended at this time by neurology, consider MRI if persistent headache.   While in Ed noted to  be tachypneic + desat to 80% on home O2 4L with activity. RFA for CAP, AHRF   Treated with ceftriaxone , azithromycin , methylprednisolone , nebs for COPD/pneumonia.  Given Reglan  and Toradol  for migraine.  Assessment/Plan:  73 y.o. 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  Presented with SOB, weakness, L facial drop. Code stroke in ED due to left-sided facial weakness, slurred speech. Ultimately deficits reported to be chronic and not consistent with acute stroke. Incidentally found to have CAP and AoCHRF.   Left-sided facial weakness, ptosis, facial spasm; reportedly chronic Code stroke in ED for left sided facial weakness, slurred speech. Reported as new per son. However patient reporting symptoms are chronic. Was evaluated by Teleneurology Dr. Eugene Hertz. LKWT 1900 per triage. NIHSS 3. CT Head with no acute abnormality. No TNK as suspected alternate diagnosis (tension headache, chronic neurologic deficit). - No additional imaging recommended at this time by neurology, consider MRI if persistent headache.  - Continue monitoring for colonic left-sided facial activity.  Per chart review this is reportedly chronic.  If she has waxing and waning mental status would consider possibility of seizure, reinvolve neurology and transfer for EEG  Community-acquired pneumonia COPD exacerbation  Acute on chronic hypoxic and hypercapnic respiratory failure Hx significant underlying lung disease with NSCLC, radiation changes, severe COPD on chronic 4L O2, requires AVAPS at night.  On initial evaluation tachypneic into the 30s, reportedly desatted to 80% on 4 L O2 with activity.  Currently requiring 4 L at rest.  Chest x-ray with new  mild patchy groundglass and interstitial opacities in the mid to lower lung fields.  -Continue ceftriaxone  1 g IV every 24 hours, azithromycin  500 mg daily.  If fails to improve would consider broadening to Pseudomonas coverage given her underlying lung disease and recent exposure to antibiotics.  -Continue methylprednisolone  60 mg IV every 12 hours for now until breathing improves -Hold on additional imaging with CT, she had a recent CT 4/22 -Check VBG, RVP, sputum culture, MRSA nares, BNP  - Continue home Trelegy Ellipta  equivalent, DuoNebs every 6 hours scheduled, albuterol  every 4 hours as needed, incentive spirometer, flutter valve, encourage out of bed to chair. -BiPAP at night, uses AVAPS nightly at home -Home O2 evaluation prior to discharge -Consider referral to pulmonary rehab  Encephalopathy, suspect metabolic Intermittent periods where she appears less alert, not fully oriented.  Suspect may be related to hypercapnia or community-acquired pneumonia. - Assessment for hypercapnia per above  Elevated creatinine without AKI Baseline creatinine near 0.4, elevated to 0.6 on admission.  Volume status is difficult to estimate due to her pulmonary hypertension likely causing JVD.  She has no peripheral edema. -Hold on IV fluids, check BNP -Trend renal function,  Chronic medical problems: Recurrent non-small cell lung cancer, stage III, s/p chemoradiation, immunotherapy: Follows with Dr. Marguerita Shih of Onc, currently on surveillance scans q4 months.  CAD, history non-STEMI: Appears no longer taking daily aspirin  or statin HTN: hold home Bisoprolol .  PUD, AVM, hx prior GI bleeding: Setting of prior aspirin  powders, NSAID use. Continue home PPI BID  Anemia, IDA: Hb baseline ~ 9. Continue iron  supplements outpatient.  Brain tumor resected in '05 with chronic left facial weakness, ptosis, facial spasm: Benign tumor per chart review  Isolated elevation of alk phos: Chronic, consider GGT to  determine source Chronic pain: Continue home oxycodone  prn   Body mass index is 17.65 kg/m. Malnutrition; mild protein calorie    DVT prophylaxis:  Lovenox  Code Status:  Full Code; presumed full  Diet:  Diet Orders (From admission, onward)     Start     Ordered   08/10/23 0120  Diet Heart Room service appropriate? Yes; Fluid consistency: Thin  Diet effective now       Question Answer Comment  Room service appropriate?  Yes   Fluid consistency: Thin      08/10/23 0123           Family Communication:  None   Consults:  None   Admission status:   Inpatient, Step Down Unit  Severity of Illness: The appropriate patient status for this patient is INPATIENT. Inpatient status is judged to be reasonable and necessary in order to provide the required intensity of service to ensure the patient's safety. The patient's presenting symptoms, physical exam findings, and initial radiographic and laboratory data in the context of their chronic comorbidities is felt to place them at high risk for further clinical deterioration. Furthermore, it is not anticipated that the patient will be medically stable for discharge from the hospital within 2 midnights of admission.   * I certify that at the point of admission it is my clinical judgment that the patient will require inpatient hospital care spanning beyond 2 midnights from the point of admission due to high intensity of service, high risk for further deterioration and high frequency of surveillance required.*   Arnulfo Larch, MD [facility]  How to contact the [facility] Attending or Consulting provider 7A - 7P or covering provider during after hours 7P -7A, for this patient.  Check the care team in [facility] and look for a) attending/consulting [facility] provider listed and b) the [facility] team listed Log into [email or web address] and use [place]'s universal password to access. If you do not have the password, please contact the hospital operator. Locate the [facility]  provider you are looking for under [facility] and page to a number that you can be directly reached. If you still have difficulty reaching the provider, please page the [facility] (Director on Call) for the Hospitalists listed on amion for assistance.  08/10/2023, 5:53 AM

## 2023-08-10 NOTE — Assessment & Plan Note (Signed)
 Multifactorial encephalopathy.  Patient continue NPO to prevent aspiration.  Continue neuro checks per unit protocol.  Aspiration precautions.

## 2023-08-11 DIAGNOSIS — I10 Essential (primary) hypertension: Secondary | ICD-10-CM | POA: Diagnosis not present

## 2023-08-11 DIAGNOSIS — J189 Pneumonia, unspecified organism: Secondary | ICD-10-CM | POA: Diagnosis not present

## 2023-08-11 DIAGNOSIS — F32A Depression, unspecified: Secondary | ICD-10-CM | POA: Diagnosis not present

## 2023-08-11 DIAGNOSIS — G9341 Metabolic encephalopathy: Secondary | ICD-10-CM | POA: Diagnosis not present

## 2023-08-11 DIAGNOSIS — D649 Anemia, unspecified: Secondary | ICD-10-CM | POA: Insufficient documentation

## 2023-08-11 LAB — BLOOD GAS, VENOUS
Acid-Base Excess: 14 mmol/L — ABNORMAL HIGH (ref 0.0–2.0)
Bicarbonate: 42.6 mmol/L — ABNORMAL HIGH (ref 20.0–28.0)
Drawn by: 53361
O2 Saturation: 87.7 %
Patient temperature: 37
pCO2, Ven: 72 mmHg (ref 44–60)
pH, Ven: 7.38 (ref 7.25–7.43)
pO2, Ven: 49 mmHg — ABNORMAL HIGH (ref 32–45)

## 2023-08-11 LAB — CBC
HCT: 23.6 % — ABNORMAL LOW (ref 36.0–46.0)
Hemoglobin: 7.3 g/dL — ABNORMAL LOW (ref 12.0–15.0)
MCH: 24.2 pg — ABNORMAL LOW (ref 26.0–34.0)
MCHC: 30.9 g/dL (ref 30.0–36.0)
MCV: 78.1 fL — ABNORMAL LOW (ref 80.0–100.0)
Platelets: 248 10*3/uL (ref 150–400)
RBC: 3.02 MIL/uL — ABNORMAL LOW (ref 3.87–5.11)
RDW: 18.5 % — ABNORMAL HIGH (ref 11.5–15.5)
WBC: 7.5 10*3/uL (ref 4.0–10.5)
nRBC: 0 % (ref 0.0–0.2)

## 2023-08-11 LAB — BASIC METABOLIC PANEL WITH GFR
Anion gap: 9 (ref 5–15)
BUN: 27 mg/dL — ABNORMAL HIGH (ref 8–23)
CO2: 35 mmol/L — ABNORMAL HIGH (ref 22–32)
Calcium: 9.2 mg/dL (ref 8.9–10.3)
Chloride: 98 mmol/L (ref 98–111)
Creatinine, Ser: 0.5 mg/dL (ref 0.44–1.00)
GFR, Estimated: >60 mL/min (ref >60)
Glucose, Bld: 145 mg/dL — ABNORMAL HIGH (ref 70–99)
Potassium: 4.9 mmol/L (ref 3.5–5.1)
Sodium: 142 mmol/L (ref 135–145)

## 2023-08-11 MED ORDER — ENSURE PO LIQD
237.0000 mL | Freq: Three times a day (TID) | ORAL | Status: DC
  Filled 2023-08-11: qty 237

## 2023-08-11 MED ORDER — GUAIFENESIN-DM 100-10 MG/5ML PO SYRP
5.0000 mL | ORAL_SOLUTION | ORAL | Status: DC | PRN
  Administered 2023-08-11 – 2023-08-12 (×4): 5 mL via ORAL
  Filled 2023-08-11 (×4): qty 5

## 2023-08-11 MED ORDER — PANTOPRAZOLE SODIUM 40 MG PO TBEC
40.0000 mg | DELAYED_RELEASE_TABLET | Freq: Every day | ORAL | Status: DC
  Administered 2023-08-12: 40 mg via ORAL
  Filled 2023-08-11: qty 1

## 2023-08-11 MED ORDER — TAMSULOSIN HCL 0.4 MG PO CAPS: 0.4000 mg | ORAL_CAPSULE | Freq: Every day | ORAL | Status: DC

## 2023-08-11 MED ORDER — DULOXETINE HCL 60 MG PO CPEP: 60.0000 mg | ORAL_CAPSULE | Freq: Two times a day (BID) | ORAL | Status: DC

## 2023-08-11 MED ORDER — FERROUS SULFATE 325 (65 FE) MG PO TABS
325.0000 mg | ORAL_TABLET | Freq: Every day | ORAL | Status: DC
  Administered 2023-08-12: 325 mg via ORAL
  Filled 2023-08-11: qty 1

## 2023-08-11 MED ORDER — OXYCODONE-ACETAMINOPHEN 5-325 MG PO TABS
1.0000 | ORAL_TABLET | Freq: Three times a day (TID) | ORAL | Status: DC | PRN
  Administered 2023-08-11 (×2): 1 via ORAL
  Filled 2023-08-11 (×2): qty 1

## 2023-08-11 MED ORDER — ENSURE ENLIVE PO LIQD
237.0000 mL | Freq: Three times a day (TID) | ORAL | Status: DC
  Administered 2023-08-11 – 2023-08-12 (×4): 237 mL via ORAL

## 2023-08-11 MED ORDER — BISOPROLOL FUMARATE 5 MG PO TABS
2.5000 mg | ORAL_TABLET | Freq: Every day | ORAL | Status: DC
  Administered 2023-08-11 – 2023-08-12 (×2): 2.5 mg via ORAL
  Filled 2023-08-11 (×2): qty 1

## 2023-08-11 MED ORDER — TRAZODONE HCL 50 MG PO TABS
50.0000 mg | ORAL_TABLET | Freq: Every day | ORAL | Status: DC
  Administered 2023-08-11: 50 mg via ORAL
  Filled 2023-08-11: qty 1

## 2023-08-11 MED ORDER — AZITHROMYCIN 250 MG PO TABS
500.0000 mg | ORAL_TABLET | Freq: Every day | ORAL | Status: DC
  Administered 2023-08-11: 500 mg via ORAL
  Filled 2023-08-11: qty 2

## 2023-08-11 NOTE — Progress Notes (Addendum)
 Progress Note   PatientTaycee Zuniga WUJ:[id number] DOB: 04-03-1951 DOA: 08/09/2023     1 DOS: the patient was seen and examined on 08/11/2023   Brief hospital course: Courtney Zuniga was admitted to the hospital with the working diagnosis of COPD exacerbation.   73 yo female with the past medical history of stage 4 lung cancer, COPD, coronary artery disease, anemia and resected brain tumor in 2005 who presented with dyspnea, left sided weakness, and left facial drop. Reported recently worsening of dyspnea. On her initial physical examination her blood pressure was 105/64, HR 88, RR 28 and 02 saturation 93%, lungs with increased work of breathing, poor air movement and diffuse wheezing, heart with S1 and S2 present and regular, abdomen with no distention and no lower extremity edema.  Noted to be awake and intermittent episodes of alertness, positive disorientation, and left sided ptosis.   Chest radiograph with hyperinflation, no cardiomegaly, no effusion, small nodular opacities, lower lobes and upper lobes.   EKG 87 bpm, normal axis, normal intervals, qtc 447, sinus rhythm with no significant ST segment changes, negative T wave V1 to V2.   04/29 patient responded well to continuous Bipap with improvement in her mentation and gas exchange.  Transitioned now to Bipap PRN during the day and continuous during the night.     Assessment and Plan: * Acute metabolic encephalopathy Multifactorial encephalopathy.  Clinically now resolved, patient back to her baseline mentation.  No neurologic deficit.  Advance diet to regular and resume nutritional supplements.  Consult nutrition.   Community acquired bilateral lower lobe pneumonia Acute on chronic hypoxemic and hypercapnic respiratory failure.  COPD exacerbation, rhinovirus infection.  (No sepsis on admission).   VBG 7,38/ 72/ 49/ 42/ 87%   This am patient has been transitioned to nasal cannula at 4 Lmin with 02 saturation 100%   Continue antibiotic therapy with ceftriaxone  IV and azithromycin  PO Bronchodilator therapy Inhaled and systemic corticosteroids.   Will continue bipap as needed during the day and continuous at night.  Follow up VBG in am.  Airway clearing techniques with flutter valve and incentive spirometer.  Out of bed to chair tid with meals, PT and Ot  HTN (hypertension) Continue blood pressure monitoring  Systolic blood pressure has been 130 mmHg range.  Resume bisoprolol .   Adenocarcinoma of right lung, stage 4 Follow up with Dr Marguerita Shih 03/2023 with no signs of disease progression.  In March 2022 she had 55 cycles of nivolumab .  She has been under observation since then.   Depression Resume duloxetine  and trazodone .  Resume as needed oxycodone .    Iron  deficiency anemia Follow up hgb is 7,3 with plt 248 and wbc at 7.,5  05/2023 iron  panel with serum iron  15, TIBC 268, transferrin saturation 6 and ferritin 12.  Continue with oral iron  supplementation       Subjective: Patient is more awake and alert, dyspnea has improved, no nausea or vomiting, no chest pain   Physical Exam: Vitals:   08/11/23 0400 08/11/23 0743 08/11/23 0748 08/11/23 0800  BP: 113/65   137/65  Pulse: 91 96 (!) 103 (!) 103  Resp: (!) 27 19 14  (!) 31  Temp:  98 F (36.7 C)    TempSrc:  Oral    SpO2: 95% 100% 100% 100%  Weight:      Height:       Neurology awake and alert ENT with mild pallor with no icterus Cardiovascular with S1 and S2 present and tachycardic with no gallops,  rubs or murmurs No JVD No lower extremity edema Respiratory with prolonged expiratory phase with scattered rhonchi with no rales or wheezing Abdomen with no distention  Data Reviewed:    Family Communication: no family at the bedside   Disposition: Status is: Inpatient Remains inpatient appropriate because: non invasive mechanical ventilation   Planned Discharge Destination: Home     Author: Albertus Alt,  MD 08/11/2023 9:10 AM  For on call review [email or web address].

## 2023-08-11 NOTE — Plan of Care (Signed)
  Problem: Acute Rehab PT Goals(only PT should resolve) Goal: Pt Will Go Supine/Side To Sit Outcome: Progressing Flowsheets (Taken 08/11/2023 1504) Pt will go Supine/Side to Sit:  Independently  with modified independence Goal: Patient Will Transfer Sit To/From Stand Outcome: Progressing Flowsheets (Taken 08/11/2023 1504) Patient will transfer sit to/from stand:  with modified independence  with supervision Goal: Pt Will Transfer Bed To Chair/Chair To Bed Outcome: Progressing Flowsheets (Taken 08/11/2023 1504) Pt will Transfer Bed to Chair/Chair to Bed:  with modified independence  with supervision Goal: Pt Will Ambulate Outcome: Progressing Flowsheets (Taken 08/11/2023 1504) Pt will Ambulate:  100 feet  with modified independence  with supervision  with least restrictive assistive device  with rolling walker   3:04 PM, 08/11/23 Walton Guppy, MPT Physical Therapist with [facility] [phone] office [phone] mobile phone

## 2023-08-11 NOTE — Evaluation (Signed)
 Physical Therapy Evaluation Patient Details Name: Courtney Zuniga MRN: [id number] DOB: Aug 28, 1950 Today's Date: 08/11/2023  History of Present Illness  Courtney Zuniga is a 73 y.o. 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  Presented with SOB, weakness, L facial drop. Code stroke in ED due to left-sided facial weakness, slurred speech.  See additional ED course below.     Apparently left-sided facial deficits are chronic which patient had confirmed. C/o diffuse headache. No other neurologic symptoms.     Incidentally noted to have acute on chronic hypoxia.  Patient providing limited history but reports breathing feels "terrible".  She does not provide additional history regarding respiratory complaints   Clinical Impression  Patient had near loss of balance during transfer to chair without AD and required use of RW for transfers and ambulation in room for safety. Patient ambulated in room without loss of balance using RW, but limited mostly due to SOB and had to sit chair to recover.  Patient will benefit from continued skilled physical therapy in hospital and recommended venue below to increase strength, balance, endurance for safe ADLs and gait.         If plan is discharge home, recommend the following: A little help with walking and/or transfers;A little help with bathing/dressing/bathroom;Help with stairs or ramp for entrance;Assistance with cooking/housework   Can travel by private vehicle        Equipment Recommendations None recommended by PT  Recommendations for Other Services       Functional Status Assessment Patient has had a recent decline in their functional status and demonstrates the ability  to make significant improvements in function in a reasonable and predictable amount of time.     Precautions / Restrictions Precautions Precautions: Fall Restrictions Weight Bearing Restrictions Per Provider Order: No      Mobility  Bed Mobility Overal bed mobility: Modified Independent                  Transfers Overall transfer level: Needs assistance Equipment used: Rolling walker (2 wheels), None Transfers: Sit to/from Stand, Bed to chair/wheelchair/BSC Sit to Stand: Contact guard assist   Step pivot transfers: Contact guard assist, Min assist       General transfer comment: unsteady movement with near loss of balance without AD, safer using RW    Ambulation/Gait Ambulation/Gait assistance: Contact guard assist Gait Distance (Feet): 30 Feet Assistive device: Rolling walker (2 wheels) Gait Pattern/deviations: Decreased step length - right, Decreased step length - left, Decreased stride length Gait velocity: decreased     General Gait Details: slightly labored movement using RW without loss of balance, but limited mostly due to severe SOB, fatigue  Stairs            Wheelchair Mobility     Tilt Bed    Modified Rankin (Stroke Patients Only)       Balance Overall balance assessment: Needs assistance Sitting-balance support: Feet supported, No upper extremity supported Sitting balance-Leahy Scale: Fair Sitting balance - Comments: fair/good seated at EOB   Standing balance support: During functional activity, No upper extremity supported Standing balance-Leahy Scale: Poor Standing balance comment: fair/poor without AD, fair/good using RW  Pertinent Vitals/Pain Pain Assessment Pain Assessment: No/denies pain    Home Living Family/patient expects to be discharged to:: Private residence Living Arrangements: Children Available Help at Discharge: Family Type of Home: House Home Access: Stairs to  enter Entrance Stairs-Rails: Left Entrance Stairs-Number of Steps: 3   Home Layout: One level Home Equipment: Agricultural consultant (2 wheels);Shower seat Additional Comments: 4 LPM O2 at home    Prior Function Prior Level of Function : Independent/Modified Independent             Mobility Comments: Household and short distanced community ambulator without use of AD, on 4 LPM O2 at home ADLs Comments: Assisted by family     Extremity/Trunk Assessment   Upper Extremity Assessment Upper Extremity Assessment: Defer to OT evaluation    Lower Extremity Assessment Lower Extremity Assessment: Generalized weakness    Cervical / Trunk Assessment Cervical / Trunk Assessment: Normal  Communication   Communication Communication: No apparent difficulties    Cognition Arousal: Alert Behavior During Therapy: WFL for tasks assessed/performed   PT - Cognitive impairments: No apparent impairments                         Following commands: Intact       Cueing Cueing Techniques: Verbal cues, Tactile cues     General Comments      Exercises     Assessment/Plan    PT Assessment Patient needs continued PT services  PT Problem List Decreased strength;Decreased activity tolerance;Decreased balance;Decreased mobility;Cardiopulmonary status limiting activity       PT Treatment Interventions DME instruction;Gait training;Stair training;Functional mobility training;Therapeutic activities;Therapeutic exercise;Balance training;Patient/family education    PT Goals (Current goals can be found in the Care Plan section)  Acute Rehab PT Goals Patient Stated Goal: return home with family to assist PT Goal Formulation: With patient Time For Goal Achievement: 08/20/23 Potential to Achieve Goals: Good    Frequency Min 3X/week     Co-evaluation               AM-PAC PT "6 Clicks" Mobility  Outcome Measure Help needed turning from your back to your side while in a flat  bed without using bedrails?: None Help needed moving from lying on your back to sitting on the side of a flat bed without using bedrails?: None Help needed moving to and from a bed to a chair (including a wheelchair)?: A Little Help needed standing up from a chair using your arms (e.g., wheelchair or bedside chair)?: A Little Help needed to walk in hospital room?: A Little Help needed climbing 3-5 steps with a railing? : A Lot 6 Click Score: 19    End of Session Equipment Utilized During Treatment: Oxygen  Activity Tolerance: Patient tolerated treatment well;Patient limited by fatigue Patient left: in chair;with call bell/phone within reach Nurse Communication: Mobility status PT Visit Diagnosis: Unsteadiness on feet (R26.81);Other abnormalities of gait and mobility (R26.89);Muscle weakness (generalized) (M62.81)    Time: 0981-1914 PT Time Calculation (min) (ACUTE ONLY): 25 min   Charges:   PT Evaluation $PT Eval Moderate Complexity: 1 Mod PT Treatments $Therapeutic Activity: 23-37 mins PT General Charges $$ ACUTE PT VISIT: 1 Visit        3:02 PM, 08/11/23 Walton Guppy, MPT Physical Therapist with [facility] [phone] office [phone] mobile phone

## 2023-08-11 NOTE — Assessment & Plan Note (Signed)
 Resume duloxetine  and trazodone .  Resume as needed oxycodone .

## 2023-08-11 NOTE — Assessment & Plan Note (Deleted)
 Follow up hgb is 7,3 with plt 248 and wbc at 7.,5  05/2023 iron  panel with serum iron  15, TIBC 268, transferrin saturation 6 and ferritin 12.  Continue with oral iron  supplementation

## 2023-08-11 NOTE — Progress Notes (Signed)
 Initial Nutrition Assessment  DOCUMENTATION CODES:   Severe malnutrition in context of chronic illness, Underweight  INTERVENTION:   Continue Regular diet; add chopped meats; change to room service yes with assistance so nutritional services ambassador will assist patient with meal options.   Chocolate Ensure Enlive po TID, each supplement provides 350 kcal and 20 grams of protein.  Chocolate Magic cup TID with meals, each supplement provides 290 kcal and 9 grams of protein.  NUTRITION DIAGNOSIS:   Severe Malnutrition related to chronic illness (lung cancer s/p radiation causing dysphagia) as evidenced by severe muscle depletion, severe fat depletion.  GOAL:   Patient will meet greater than or equal to 90% of their needs  MONITOR:   PO intake, Supplement acceptance  REASON FOR ASSESSMENT:   Consult Assessment of nutrition requirement/status  ASSESSMENT:   73 yo female admitted with COPD exacerbation. PMH includes stage 4 lung cancer, COPD, CAD, anemia, resected brain tumor 2005 with chronic L sided facial deficits, HTN, asthma.  Patient states that she has had difficulty swallowing tough foods since radiation for lung cancer in 2017. She is unable to eat a lot at one time because swallowing is difficult and painful at times. She eats easy to chew and swallow foods, such as baked sweet potatoes which she had for lunch today. She was unable to eat the pulled chicken because it was too tough. She loves chocolate Ensure and chocolate ice cream, but nothing that is too sweet. She agreed to chopped meats, but does not want pureed foods at this time.   Currently on a regular diet and consumed 40% of breakfast today.   Labs reviewed.   Medications reviewed and include ferrous sulfate, solu-medrol , protonix , flomax , IV rocephin .  Weight history reviewed. Patient with 4.5% weight loss within the past 6 months, which is not clinically significant. She is chronically underweight with  BMI = 17.65.  Patient meets criteria for severe malnutrition, given severe depletion of muscle and subcutaneous fat mass.  NUTRITION - FOCUSED PHYSICAL EXAM:  Flowsheet Row Most Recent Value  Orbital Region Mild depletion  Upper Arm Region Severe depletion  Thoracic and Lumbar Region Severe depletion  Buccal Region Mild depletion  Temple Region Moderate depletion  Clavicle Bone Region Severe depletion  Clavicle and Acromion Bone Region Severe depletion  Scapular Bone Region Severe depletion  Dorsal Hand Severe depletion  Patellar Region Moderate depletion  Anterior Thigh Region Moderate depletion  Posterior Calf Region Moderate depletion  Edema (RD Assessment) None  Hair Reviewed  Eyes Reviewed  Mouth Reviewed  Skin Reviewed  Nails Reviewed       Diet Order:   Diet Order             Diet regular Fluid consistency: Thin  Diet effective now                   EDUCATION NEEDS:   Education needs have been addressed  Skin:  Skin Assessment: Reviewed RN Assessment  Last BM:  4/28  Height:   Ht Readings from Last 1 Encounters:  08/10/23 5\' 9"  (1.753 m)    Weight:   Wt Readings from Last 1 Encounters:  08/10/23 54.2 kg    Ideal Body Weight:  63.6 kg  BMI:  Body mass index is 17.65 kg/m.  Estimated Nutritional Needs:   Kcal:  1400-1600  Protein:  65-75 gm  Fluid:  >/= 1.5 L   Barnet Boots RD, LDN, CNSC Contact via secure chat. If unavailable, use group  chat "RD Inpatient."

## 2023-08-11 NOTE — Assessment & Plan Note (Signed)
 Follow up hgb is 7,3 with plt 248 and wbc at 7.,5  05/2023 iron  panel with serum iron  15, TIBC 268, transferrin saturation 6 and ferritin 12.  Continue with oral iron  supplementation

## 2023-08-11 NOTE — Progress Notes (Signed)
 Tried calling the pt and there was no answer-voicemail was full so unable to leave msg.

## 2023-08-11 NOTE — Plan of Care (Signed)

## 2023-08-12 DIAGNOSIS — G9341 Metabolic encephalopathy: Secondary | ICD-10-CM | POA: Diagnosis not present

## 2023-08-12 LAB — BASIC METABOLIC PANEL WITH GFR
Anion gap: 5 (ref 5–15)
BUN: 17 mg/dL (ref 8–23)
CO2: 38 mmol/L — ABNORMAL HIGH (ref 22–32)
Calcium: 9.2 mg/dL (ref 8.9–10.3)
Chloride: 95 mmol/L — ABNORMAL LOW (ref 98–111)
Creatinine, Ser: 0.45 mg/dL (ref 0.44–1.00)
GFR, Estimated: >60 mL/min (ref >60)
Glucose, Bld: 141 mg/dL — ABNORMAL HIGH (ref 70–99)
Potassium: 4.5 mmol/L (ref 3.5–5.1)
Sodium: 138 mmol/L (ref 135–145)

## 2023-08-12 LAB — BLOOD GAS, VENOUS
Acid-Base Excess: 16.6 mmol/L — ABNORMAL HIGH (ref 0.0–2.0)
Bicarbonate: 44.5 mmol/L — ABNORMAL HIGH (ref 20.0–28.0)
Drawn by: 1528
O2 Saturation: 95.5 %
Patient temperature: 37
pCO2, Ven: 67 mmHg — ABNORMAL HIGH (ref 44–60)
pH, Ven: 7.43 (ref 7.25–7.43)
pO2, Ven: 65 mmHg — ABNORMAL HIGH (ref 32–45)

## 2023-08-12 LAB — CBC
HCT: 23.2 % — ABNORMAL LOW (ref 36.0–46.0)
Hemoglobin: 7.3 g/dL — ABNORMAL LOW (ref 12.0–15.0)
MCH: 24.3 pg — ABNORMAL LOW (ref 26.0–34.0)
MCHC: 31.5 g/dL (ref 30.0–36.0)
MCV: 77.3 fL — ABNORMAL LOW (ref 80.0–100.0)
Platelets: 257 10*3/uL (ref 150–400)
RBC: 3 MIL/uL — ABNORMAL LOW (ref 3.87–5.11)
RDW: 18.7 % — ABNORMAL HIGH (ref 11.5–15.5)
WBC: 5.9 10*3/uL (ref 4.0–10.5)
nRBC: 0 % (ref 0.0–0.2)

## 2023-08-12 MED ORDER — IPRATROPIUM-ALBUTEROL 0.5-2.5 (3) MG/3ML IN SOLN: 3.0000 mL | Freq: Two times a day (BID) | RESPIRATORY_TRACT | Status: DC

## 2023-08-12 MED ORDER — METHYLPREDNISOLONE SODIUM SUCC 40 MG IJ SOLR: 40.0000 mg | Freq: Two times a day (BID) | INTRAMUSCULAR | Status: DC

## 2023-08-12 MED ORDER — CEFDINIR 300 MG PO CAPS: 300.0000 mg | ORAL_CAPSULE | Freq: Two times a day (BID) | ORAL | 0 refills | Status: AC

## 2023-08-12 MED ORDER — AZITHROMYCIN 250 MG PO TABS: 250.0000 mg | ORAL_TABLET | Freq: Every day | ORAL | 0 refills | Status: AC

## 2023-08-12 MED ORDER — FERROUS SULFATE 325 (65 FE) MG PO TABS: 325.0000 mg | ORAL_TABLET | Freq: Every day | ORAL | 0 refills | Status: AC

## 2023-08-12 MED ORDER — DOCUSATE SODIUM 100 MG PO CAPS: 100.0000 mg | ORAL_CAPSULE | Freq: Two times a day (BID) | ORAL | 2 refills | Status: AC

## 2023-08-12 MED ORDER — PREDNISONE 10 MG PO TABS
40.0000 mg | ORAL_TABLET | Freq: Every day | ORAL | 0 refills | Status: AC
Start: 2023-08-12 — End: 2023-08-17

## 2023-08-12 NOTE — Progress Notes (Signed)
 PROGRESS NOTE    Kalena Holzer  WUJ:[id number] DOB: Dec 10, 1950 DOA: 08/09/2023 PCP: Caresse Chant, FNP   Brief Narrative:  Mrs. Rake was admitted to the hospital with the working diagnosis of COPD exacerbation.   73 yo female with the past medical history of stage 4 lung cancer, COPD, coronary artery disease, anemia and resected brain tumor in 2005 who presented with dyspnea, left sided weakness, and left facial drop. Reported recently worsening of dyspnea. On her initial physical examination her blood pressure was 105/64, HR 88, RR 28 and 02 saturation 93%, lungs with increased work of breathing, poor air movement and diffuse wheezing, heart with S1 and S2 present and regular, abdomen with no distention and no lower extremity edema.  Noted to be awake and intermittent episodes of alertness, positive disorientation, and left sided ptosis.   Chest radiograph with hyperinflation, no cardiomegaly, no effusion, small nodular opacities, lower lobes and upper lobes.   EKG 87 bpm, normal axis, normal intervals, qtc 447, sinus rhythm with no significant ST segment changes, negative T wave V1 to V2.   04/29 patient responded well to continuous Bipap with improvement in her mentation and gas exchange.  Transitioned now to Bipap PRN during the day and continuous during the night.       Assessment & Plan:   Principal Problem:   Acute metabolic encephalopathy Active Problems:   Community acquired bilateral lower lobe pneumonia   HTN (hypertension)   Adenocarcinoma of right lung, stage 4   Depression   Iron  deficiency anemia  Assessment and Plan:  Acute metabolic encephalopathy Multifactorial encephalopathy.  Clinically now resolved, patient back to her baseline mentation.  No neurologic deficit.  Advance diet to regular and resume nutritional supplements.  Consult nutrition.    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  COPD exacerbation, rhinovirus infection.  (No sepsis on admission).    VBG 7,38/ 72/ 49/ 42/ 87%    This am patient has been transitioned to nasal cannula at 4 Lmin with 02 saturation 100%  Continue antibiotic therapy with ceftriaxone  IV and azithromycin  PO Bronchodilator therapy Inhaled and systemic corticosteroids.    Will continue bipap as needed during the day and continuous at night.  Follow up VBG in am.  Airway clearing techniques with flutter valve and incentive spirometer.  Out of bed to chair tid with meals, PT and Ot Wean steroids today and okay for transfer to telemetry 4/30   HTN (hypertension) Continue blood pressure monitoring  Systolic blood pressure has been 130 mmHg range.  Resume bisoprolol .    Adenocarcinoma of right lung, stage 4 Follow up with Dr Marguerita Shih 03/2023 with no signs of disease progression.  In March 2022 she had 55 cycles of nivolumab .  She has been under observation since then.    Depression Resume duloxetine  and trazodone .  Resume as needed oxycodone .      Iron  deficiency anemia Follow up hgb is 7,3 with plt 248 and wbc at 7.,5  05/2023 iron  panel with serum iron  15, TIBC 268, transferrin saturation 6 and ferritin 12.  Continue with oral iron  supplementation     DVT prophylaxis:Lovenox  Code Status: Full Family Communication: None at bedside Disposition Plan:  Status is: Inpatient Remains inpatient appropriate because: Need for IV medications.   Consultants:  None  Procedures:  None  Antimicrobials:  Anti-infectives (From admission, onward)    Start     Dose/Rate Route Frequency Ordered Stop   08/11/23 2200  azithromycin  (ZITHROMAX ) tablet 500 mg        500 mg Oral Daily 08/11/23 1012     08/10/23 2200  azithromycin  (ZITHROMAX ) tablet 500 mg  Status:  Discontinued        500 mg Oral Daily at bedtime 08/10/23 0123 08/10/23 1013   08/10/23 2200  cefTRIAXone  (ROCEPHIN ) 1 g in sodium chloride  0.9 % 100 mL  IVPB        1 g 200 mL/hr over 30 Minutes Intravenous Every 24 hours 08/10/23 0123 08/14/23 2159   08/10/23 2200  azithromycin  (ZITHROMAX ) 500 mg in sodium chloride  0.9 % 250 mL IVPB  Status:  Discontinued        500 mg 250 mL/hr over 60 Minutes Intravenous Every 24 hours 08/10/23 1013 08/11/23 1012   08/09/23 2215  cefTRIAXone  (ROCEPHIN ) 1 g in sodium chloride  0.9 % 100 mL IVPB        1 g 200 mL/hr over 30 Minutes Intravenous  Once 08/09/23 2206 08/09/23 2320   08/09/23 2215  azithromycin  (ZITHROMAX ) 500 mg in sodium chloride  0.9 % 250 mL IVPB  Status:  Discontinued        500 mg 250 mL/hr over 60 Minutes Intravenous Every 24 hours 08/09/23 2206 08/10/23 0123      Subjective: Patient seen and evaluated today with no new acute complaints or concerns. No acute concerns or events noted overnight.  She states that her cough has improved considerably and has rested well overnight.  Objective: Vitals:   08/12/23 0600 08/12/23 0700 08/12/23 0800 08/12/23 0802  BP: 138/85 (!) 144/76 127/69   Pulse: 92     Resp: (!) 21 (!) 23    Temp:    98.5 F (36.9 C)  TempSrc:    Oral  SpO2: 97% 100%    Weight:      Height:        Intake/Output Summary (Last 24 hours) at 08/12/2023 1012 Last data filed at 08/12/2023 0847 Gross per 24 hour  Intake 340 ml  Output 1500 ml  Net -1160 ml   Filed Weights   08/09/23 2030 08/10/23 0301  Weight: 54.9 kg 54.2 kg    Examination:  General exam: Appears calm and comfortable  Respiratory system: Clear to auscultation. Respiratory effort normal.  4 L nasal cannula. Cardiovascular system: S1 & S2 heard, RRR.  Gastrointestinal system: Abdomen is soft Central nervous system: Alert and awake Extremities: No edema Skin: No significant lesions noted Psychiatry: Flat affect.    Data Reviewed: I have personally reviewed following labs and imaging studies  CBC: Recent Labs  Lab 08/06/23 1103 08/09/23 2046 08/10/23 0444 08/11/23 0233 08/12/23 0459   WBC 6.1 6.0 7.0 7.5 5.9  NEUTROABS 4.8 4.6  --   --   --   HGB 9.6* 8.9* 8.6* 7.3* 7.3*  HCT 32.8* 29.4* 27.9* 23.6* 23.2*  MCV 85 80.1 80.2 78.1* 77.3*  PLT 345 258 261 248 257   Basic Metabolic Panel: Recent Labs  Lab 08/06/23 1103 08/09/23 2046 08/10/23 0444 08/11/23 0233 08/12/23 0459  NA 146* 137 142 142 138  K 4.7 4.2 4.6 4.9 4.5  CL 100 95* 99 98 95*  CO2 30* 34* 34* 35* 38*  GLUCOSE 81 139* 116* 145* 141*  BUN 18 15 16  27* 17  CREATININE 0.47* 0.62 0.56 0.50 0.45  CALCIUM  9.2 8.8* 8.9 9.2 9.2  MG  --   --  1.8  --   --   PHOS  --   --  4.2  --   --    GFR: Estimated Creatinine Clearance: 52.8 mL/min (by C-G formula based on SCr of 0.45 mg/dL). Liver Function Tests: Recent Labs  Lab 08/09/23 2046  AST 17  ALT 11  ALKPHOS 231*  BILITOT 0.3  PROT 7.4  ALBUMIN  3.8   No results for input(s): "LIPASE", "AMYLASE" in the last 168 hours. No results for input(s): "AMMONIA" in the last 168 hours. Coagulation Profile: Recent Labs  Lab 08/09/23 2046  INR 1.1   Cardiac Enzymes: No results for input(s): "CKTOTAL", "CKMB", "CKMBINDEX", "TROPONINI" in the last 168 hours. BNP (last 3 results) No results for input(s): "PROBNP" in the last 8760 hours. HbA1C: No results for input(s): "HGBA1C" in the last 72 hours. CBG: Recent Labs  Lab 08/09/23 2035  GLUCAP 114*   Lipid Profile: No results for input(s): "CHOL", "HDL", "LDLCALC", "TRIG", "CHOLHDL", "LDLDIRECT" in the last 72 hours. Thyroid  Function Tests: No results for input(s): "TSH", "T4TOTAL", "FREET4", "T3FREE", "THYROIDAB" in the last 72 hours. Anemia Panel: No results for input(s): "VITAMINB12", "FOLATE", "FERRITIN", "TIBC", "IRON ", "RETICCTPCT" in the last 72 hours. Sepsis Labs: Recent Labs  Lab 08/09/23 2224 08/10/23 0007 08/10/23 0444  PROCALCITON  --   --  <0.10  LATICACIDVEN 0.7 0.8  --     Recent Results (from the past 240 hours)  Resp panel by RT-PCR (RSV, Flu A&B, Covid) Anterior Nasal  Swab     Status: None   Collection Time: 08/09/23  8:30 PM   Specimen: Anterior Nasal Swab  Result Value Ref Range Status   SARS Coronavirus 2 by RT PCR NEGATIVE NEGATIVE Final    Comment: (NOTE) SARS-CoV-2 target nucleic acids are NOT DETECTED.  The SARS-CoV-2 RNA is generally detectable in upper respiratory specimens during the acute phase of infection. The lowest concentration of SARS-CoV-2 viral copies this assay can detect is 138 copies/mL. A negative result does not preclude SARS-Cov-2 infection and should not be used as the sole basis for treatment or other patient management decisions. A negative result may occur with  improper specimen collection/handling, submission of specimen other than nasopharyngeal swab, presence of viral mutation(s) within the areas targeted by this assay, and inadequate number of viral copies(<138 copies/mL). A negative result must be combined with clinical observations, patient history, and epidemiological information. The expected result is Negative.  Fact Sheet for Patients:  BloggerCourse.com  Fact Sheet for Healthcare Providers:  SeriousBroker.it  This test is no t yet approved or cleared by the [place]  FDA and  has been authorized for detection and/or diagnosis of SARS-CoV-2 by FDA under an Emergency Use Authorization (EUA). This EUA will remain  in effect (meaning this test can be used) for the duration of the COVID-19 declaration under Section 564(b)(1) of the Act, 21 U.S.C.section 360bbb-3(b)(1), unless the authorization is terminated  or revoked sooner.       Influenza A by PCR NEGATIVE NEGATIVE Final   Influenza B by PCR NEGATIVE NEGATIVE Final    Comment: (NOTE) The Xpert Xpress SARS-CoV-2/FLU/RSV plus assay is intended as an aid in the diagnosis of influenza from Nasopharyngeal swab specimens and should not be used as a sole basis for treatment. Nasal washings and aspirates  are unacceptable for Xpert Xpress SARS-CoV-2/FLU/RSV testing.  Fact Sheet for Patients: BloggerCourse.com  Fact Sheet for Healthcare Providers: SeriousBroker.it  This test is not yet approved or cleared by the [place]  FDA and has been authorized for detection and/or diagnosis of SARS-CoV-2 by FDA under an Emergency Use Authorization (  EUA). This EUA will remain in effect (meaning this test can be used) for the duration of the COVID-19 declaration under Section 564(b)(1) of the Act, 21 U.S.C. section 360bbb-3(b)(1), unless the authorization is terminated or revoked.     Resp Syncytial Virus by PCR NEGATIVE NEGATIVE Final    Comment: (NOTE) Fact Sheet for Patients: BloggerCourse.com  Fact Sheet for Healthcare Providers: SeriousBroker.it  This test is not yet approved or cleared by the [place]  FDA and has been authorized for detection and/or diagnosis of SARS-CoV-2 by FDA under an Emergency Use Authorization (EUA). This EUA will remain in effect (meaning this test can be used) for the duration of the COVID-19 declaration under Section 564(b)(1) of the Act, 21 U.S.C. section 360bbb-3(b)(1), unless the authorization is terminated or revoked.  Performed at [facility], [address] [place], [place] [address]   Culture, blood (routine x 2)     Status: None (Preliminary result)   Collection Time: 08/09/23 10:24 PM   Specimen: BLOOD RIGHT FOREARM  Result Value Ref Range Status   Specimen Description BLOOD RIGHT FOREARM  Final   Special Requests   Final    BOTTLES DRAWN AEROBIC AND ANAEROBIC Blood Culture adequate volume   Culture   Final    NO GROWTH 3 DAYS Performed at [facility], [address], [place], [place] [address]    Report Status PENDING  Incomplete  Culture, blood (routine x 2)     Status: None (Preliminary result)   Collection Time: 08/09/23 10:31 PM    Specimen: BLOOD RIGHT HAND  Result Value Ref Range Status   Specimen Description BLOOD RIGHT HAND  Final   Special Requests   Final    BOTTLES DRAWN AEROBIC AND ANAEROBIC Blood Culture adequate volume   Culture   Final    NO GROWTH 3 DAYS Performed at [facility], [address] [place], [place] [address]    Report Status PENDING  Incomplete  MRSA Next Gen by PCR, Nasal     Status: None   Collection Time: 08/10/23  2:40 AM   Specimen: Nasal Mucosa; Nasal Swab  Result Value Ref Range Status   MRSA by PCR Next Gen NOT DETECTED NOT DETECTED Final    Comment: (NOTE) The GeneXpert MRSA Assay (FDA approved for NASAL specimens only), is one component of a comprehensive MRSA colonization surveillance program. It is not intended to diagnose MRSA infection nor to guide or monitor treatment for MRSA infections. Test performance is not FDA approved in patients less than 12 years old. Performed at [facility], [address], [place], [place] [address]   Respiratory (~20 pathogens) panel by PCR     Status: Abnormal   Collection Time: 08/10/23  7:45 AM   Specimen: Nasopharyngeal Swab; Respiratory  Result Value Ref Range Status   Adenovirus NOT DETECTED NOT DETECTED Final   Coronavirus 229E NOT DETECTED NOT DETECTED Final    Comment: (NOTE) The Coronavirus on the Respiratory Panel, DOES NOT test for the novel  Coronavirus (2019 nCoV)    Coronavirus HKU1 NOT DETECTED NOT DETECTED Final   Coronavirus NL63 NOT DETECTED NOT DETECTED Final   Coronavirus OC43 NOT DETECTED NOT DETECTED Final   Metapneumovirus NOT DETECTED NOT DETECTED Final   Rhinovirus / Enterovirus DETECTED (A) NOT DETECTED Final   Influenza A NOT DETECTED NOT DETECTED Final   Influenza B NOT DETECTED NOT DETECTED Final   Parainfluenza Virus 1 NOT DETECTED NOT DETECTED Final   Parainfluenza Virus 2 NOT DETECTED NOT  DETECTED Final   Parainfluenza Virus 3 NOT DETECTED NOT DETECTED Final   Parainfluenza Virus 4 NOT DETECTED NOT  DETECTED Final   Respiratory Syncytial Virus NOT DETECTED NOT DETECTED Final   Bordetella pertussis NOT DETECTED NOT DETECTED Final   Bordetella Parapertussis NOT DETECTED NOT DETECTED Final   Chlamydophila pneumoniae NOT DETECTED NOT DETECTED Final   Mycoplasma pneumoniae NOT DETECTED NOT DETECTED Final    Comment: Performed at [facility], [address] [place], [place] [address]         Radiology Studies: No results found.      Scheduled Meds:  azithromycin   500 mg Oral Daily   bisoprolol   2.5 mg Oral Daily   budesonide  (PULMICORT ) nebulizer solution  0.25 mg Nebulization BID   Chlorhexidine  Gluconate Cloth  6 each Topical Daily   enoxaparin  (LOVENOX ) injection  40 mg Subcutaneous Q24H   feeding supplement  237 mL Oral TID BM   ferrous sulfate  325 mg Oral Q breakfast   ipratropium-albuterol   3 mL Nebulization BID   methylPREDNISolone  (SOLU-MEDROL ) injection  40 mg Intravenous Q12H   pantoprazole   40 mg Oral Daily   sodium chloride  flush  3 mL Intravenous Q12H   tamsulosin   0.4 mg Oral QPC supper   traZODone   50 mg Oral QHS   Continuous Infusions:  cefTRIAXone  (ROCEPHIN )  IV Stopped (08/11/23 2157)     LOS: 2 days    Time spent: 55 minutes    Karn Derk D Mason Sole, DO [facility]  If 7PM-7AM, please contact night-coverage [email or web address] 08/12/2023, 10:12 AM

## 2023-08-12 NOTE — Progress Notes (Signed)
 Patient requesting to be discharged due to patient's daughter having a procedure at [facility] tomorrow 5/1. Dr. Mason Sole made aware and okay with discharging. Patient discharged on her chronic 4L of oxygen . Patient verbalized understanding of discharge education.

## 2023-08-12 NOTE — Discharge Summary (Signed)
 Physician Discharge Summary  Courtney Zuniga BJY:[id number] DOB: June 24, 1950 DOA: 08/09/2023  PCP: Caresse Chant, FNP  Admit date: 08/09/2023  Discharge date: 08/12/2023  Admitted From:Home  Disposition:  Home  Recommendations for Outpatient Follow-up:  Follow up with PCP in 1-2 weeks Continue on prednisone  for 5 more days as prescribed Finish course of antibiotics as prescribed Continue other home medications as prior  Home Health: Yes with PT/OT  Equipment/Devices: Has home 4 L nasal cannula oxygen  and CPAP at night  Discharge Condition:Stable  CODE STATUS: Full  Diet recommendation: Heart Healthy  Brief/Interim Summary: Courtney Zuniga was admitted to the hospital with the working diagnosis of COPD exacerbation.    73 yo female with the past medical history of stage 4 lung cancer, COPD, coronary artery disease, anemia and resected brain tumor in 2005 who presented with dyspnea, left sided weakness, and left facial drop. Reported recently worsening of dyspnea. On her initial physical examination her blood pressure was 105/64, HR 88, RR 28 and 02 saturation 93%, lungs with increased work of breathing, poor air movement and diffuse wheezing, heart with S1 and S2 present and regular, abdomen with no distention and no lower extremity edema.  Noted to be awake and intermittent episodes of alertness, positive disorientation, and left sided ptosis.    Chest radiograph with hyperinflation, no cardiomegaly, no effusion, small nodular opacities, lower lobes and upper lobes.    EKG 87 bpm, normal axis, normal intervals, qtc 447, sinus rhythm with no significant ST segment changes, negative T wave V1 to V2.    04/29 patient responded well to continuous Bipap with improvement in her mentation and gas exchange.  Transitioned now to Bipap PRN during the day and continuous during the night.    4/30 patient is stable for discharge and will transition to oral prednisone  to complete tapering  process and remain on antibiotics for 3 more days to complete course of treatment.  She remains on her baseline 4 L nasal cannula and has home PT/OT set up.  No other acute events or concerns noted.  Discharge Diagnoses:  Principal Problem:   Acute metabolic encephalopathy Active Problems:   Community acquired bilateral lower lobe pneumonia   HTN (hypertension)   Adenocarcinoma of right lung, stage 4   Depression   Iron  deficiency anemia  Principal discharge diagnosis: Acute metabolic encephalopathy-multifactorial in the setting of acute on chronic hypoxemic and hypercapnic respiratory failure with COPD exacerbation/rhinovirus infection.  Superimposed community-acquired bilateral lower lobe pneumonia.  Discharge Instructions  Discharge Instructions     Diet - low sodium heart healthy   Complete by: As directed    Increase activity slowly   Complete by: As directed       Allergies as of 08/12/2023       Reactions   Desyrel  [trazodone ] Other (See Comments)   "Made me faint"        Medication List     TAKE these medications    albuterol  108 (90 Base) MCG/ACT inhaler Commonly known as: VENTOLIN  HFA Inhale 2 puffs into the lungs every 4 (four) hours as needed for shortness of breath.   albuterol  (2.5 MG/3ML) 0.083% nebulizer solution Commonly known as: PROVENTIL  Take 3 mLs (2.5 mg total) by nebulization 3 (three) times daily as needed for shortness of breath or wheezing.   azithromycin  250 MG tablet Commonly known as: ZITHROMAX  Take 1 tablet (250 mg total) by mouth daily for 3 days.   BC HEADACHE POWDER PO Take 1 packet by mouth as  needed (headache).   benzonatate 100 MG capsule Commonly known as: TESSALON Take 100 mg by mouth 3 (three) times daily as needed.   bisoprolol  5 MG tablet Commonly known as: ZEBETA  Take 0.5 tablets (2.5 mg total) by mouth daily. What changed:  when to take this reasons to take this   cefdinir  300 MG capsule Commonly known as:  OMNICEF  Take 1 capsule (300 mg total) by mouth 2 (two) times daily for 3 days.   Combivent  Respimat 20-100 MCG/ACT Aers respimat Generic drug: Ipratropium-Albuterol  Inhale 1 puff into the lungs every 6 (six) hours as needed for wheezing or shortness of breath.   DSS 100 MG Caps Take 1 capsule by mouth daily as needed (constipation). What changed: Another medication with the same name was added. Make sure you understand how and when to take each.   docusate sodium  100 MG capsule Commonly known as: Colace Take 1 capsule (100 mg total) by mouth 2 (two) times daily. What changed: You were already taking a medication with the same name, and this prescription was added. Make sure you understand how and when to take each.   DULoxetine  60 MG capsule Commonly known as: CYMBALTA  Take 1 capsule (60 mg total) by mouth 2 (two) times daily.   Ensure Take 237 mLs by mouth 3 (three) times daily between meals.   ferrous sulfate 325 (65 FE) MG tablet Take 1 tablet (325 mg total) by mouth daily with breakfast. Start taking on: Aug 13, 2023   oxyCODONE -acetaminophen  5-325 MG tablet Commonly known as: PERCOCET/ROXICET Take 1 tablet by mouth every 8 (eight) hours as needed for moderate pain.   pantoprazole  40 MG tablet Commonly known as: PROTONIX  Take 1 tablet (40 mg total) by mouth 2 (two) times daily.   predniSONE  10 MG tablet Commonly known as: DELTASONE  Take 4 tablets (40 mg total) by mouth daily for 5 days.   tamsulosin  0.4 MG Caps capsule Commonly known as: FLOMAX  Take 1 capsule (0.4 mg total) by mouth daily after supper.   traZODone  50 MG tablet Commonly known as: DESYREL  Take 1 tablet (50 mg total) by mouth at bedtime.   Trelegy Ellipta  100-62.5-25 MCG/ACT Aepb Generic drug: Fluticasone -Umeclidin-Vilant INHALE 1 PUFF BY MOUTH EVERY DAY   Vitamin D  (Ergocalciferol ) 1.25 MG (50000 UNIT) Caps capsule Commonly known as: DRISDOL  Take 1 capsule (50,000 Units total) by mouth once a  week.        Follow-up Information     Caresse Chant, FNP. Schedule an appointment as soon as possible for a visit in 1 week(s).   Specialty: Family Medicine Contact information: [address] [place] [address] [phone]                Allergies  Allergen Reactions   Desyrel  [Trazodone ] Other (See Comments)    "Made me faint"    Consultations: None   Procedures/Studies: CT Chest Wo Contrast Result Date: 08/10/2023 CLINICAL DATA:  Non-small cell lung cancer status post radiation therapy completed in 2017 with ongoing chemotherapy. Restaging examination. Intermittent chest pain and nocturnal cough. * Tracking Code: BO * EXAM: CT CHEST, ABDOMEN AND PELVIS WITHOUT CONTRAST TECHNIQUE: Multidetector CT imaging of the chest, abdomen and pelvis was performed following the standard protocol without IV contrast. RADIATION DOSE REDUCTION: This exam was performed according to the departmental dose-optimization program which includes automated exposure control, adjustment of the mA and/or kV according to patient size and/or use of iterative reconstruction technique. COMPARISON:  05/28/2023 FINDINGS: CT CHEST FINDINGS Cardiovascular: Extensive  coronary artery calcification. Calcification of the aortic valve leaflets. Global cardiac size within limits of there is notable enlargement of the right ventricle in keeping with elevated right heart pressure. Central pulmonary arteries are stably enlarged in keeping with changes of pulmonary arterial hypertension. Hypoattenuation of the cardiac blood pool in keeping with at least mild anemia. No pericardial effusion. Moderate atherosclerotic calcification within the thoracic aorta. No aortic aneurysm. Right internal jugular chest port tip seen at the superior cavoatrial junction. Mediastinum/Nodes: No enlarged mediastinal, hilar, or axillary lymph nodes. Thyroid  gland, trachea, and esophagus demonstrate no significant findings. Lungs/Pleura: Stable  spiculated nodule within the right apex and an area of parenchymal scarring measuring 1.9 x 1.3 cm at axial image # 30/7. Stable bandlike scarring within the subpleural right lower lobe, axial image # 101/7. New mean 6 mm noncalcified pulmonary nodule, axial image # 117/7, right lower lobe. Right perihilar post radiation cicatricial changes are again identified and are stable. Severe emphysema with stable pulmonary hyperinflation. Interval development small right pleural effusion. No pneumothorax. No central obstructing lesion Musculoskeletal: No acute bone abnormality. No lytic or blastic bone lesion. Osseous structures are age appropriate. CT ABDOMEN PELVIS FINDINGS Hepatobiliary: No focal liver abnormality is seen. Status post cholecystectomy. No intrahepatic biliary ductal dilation. Stable mild dilation of the extrahepatic duct measuring up to 13 mm likely representing post cholecystectomy change. Pancreas: Unremarkable Spleen: Unremarkable Adrenals/Urinary Tract: Adrenal glands are unremarkable. Simple cortical cysts are seen within the kidneys bilaterally for which no follow-up imaging is recommended. The kidneys are otherwise unremarkable. Bladder unremarkable. Stomach/Bowel: Severe descending and sigmoid diverticulosis. Stomach, small bowel, and large bowel are otherwise unremarkable. Appendix normal. No evidence of obstruction or focal inflammation. No free intraperitoneal gas or fluid. Vascular/Lymphatic: Aortic atherosclerosis. No enlarged abdominal or pelvic lymph nodes. Reproductive: Multiple calcified uterine fibroids are again identified. No adnexal masses are seen. Other: No abdominal wall hernia or abnormality. No abdominopelvic ascites. Musculoskeletal: No acute bone abnormality. No lytic or blastic bone lesion. Osseous structures are age appropriate. IMPRESSION: 1. New 6 mm noncalcified pulmonary nodule within the right lower lobe. Non-contrast chest CT at 6-12 months is recommended. If the nodule  is stable at time of repeat CT, then future CT at 18-24 months (from today's scan) is considered optional for low-risk patients, but is recommended for high-risk patients. This recommendation follows the consensus statement: Guidelines for Management of Incidental Pulmonary Nodules Detected on CT Images: From the Fleischner Society 2017; Radiology 2017; 284:228-243. 2. Stable right perihilar post radiation cicatricial changes. 3. Stable spiculated right apical pulmonary nodule in keeping with the patient's treated bronchogenic neoplasm. No evidence of recurrent or residual disease within the chest,, and pelvis. 4. Extensive coronary artery calcification. 5. Enlarged central pulmonary arteries and right ventricle in keeping with changes of pulmonary arterial hypertension and elevated right heart pressure. 6. Hypoattenuation of the cardiac blood pool in keeping with at least mild anemia. 7. Severe emphysema.  New small right pleural effusion. 8. Severe descending and sigmoid diverticulosis. Aortic Atherosclerosis (ICD10-I70.0) and Emphysema (ICD10-J43.9). Electronically Signed   By: Worthy Heads M.D.   On: 08/10/2023 00:36   CT ABDOMEN PELVIS WO CONTRAST Result Date: 08/10/2023 CLINICAL DATA:  Non-small cell lung cancer status post radiation therapy completed in 2017 with ongoing chemotherapy. Restaging examination. Intermittent chest pain and nocturnal cough. * Tracking Code: BO * EXAM: CT CHEST, ABDOMEN AND PELVIS WITHOUT CONTRAST TECHNIQUE: Multidetector CT imaging of the chest, abdomen and pelvis was performed following the standard protocol without IV  contrast. RADIATION DOSE REDUCTION: This exam was performed according to the departmental dose-optimization program which includes automated exposure control, adjustment of the mA and/or kV according to patient size and/or use of iterative reconstruction technique. COMPARISON:  05/28/2023 FINDINGS: CT CHEST FINDINGS Cardiovascular: Extensive coronary artery  calcification. Calcification of the aortic valve leaflets. Global cardiac size within limits of there is notable enlargement of the right ventricle in keeping with elevated right heart pressure. Central pulmonary arteries are stably enlarged in keeping with changes of pulmonary arterial hypertension. Hypoattenuation of the cardiac blood pool in keeping with at least mild anemia. No pericardial effusion. Moderate atherosclerotic calcification within the thoracic aorta. No aortic aneurysm. Right internal jugular chest port tip seen at the superior cavoatrial junction. Mediastinum/Nodes: No enlarged mediastinal, hilar, or axillary lymph nodes. Thyroid  gland, trachea, and esophagus demonstrate no significant findings. Lungs/Pleura: Stable spiculated nodule within the right apex and an area of parenchymal scarring measuring 1.9 x 1.3 cm at axial image # 30/7. Stable bandlike scarring within the subpleural right lower lobe, axial image # 101/7. New mean 6 mm noncalcified pulmonary nodule, axial image # 117/7, right lower lobe. Right perihilar post radiation cicatricial changes are again identified and are stable. Severe emphysema with stable pulmonary hyperinflation. Interval development small right pleural effusion. No pneumothorax. No central obstructing lesion Musculoskeletal: No acute bone abnormality. No lytic or blastic bone lesion. Osseous structures are age appropriate. CT ABDOMEN PELVIS FINDINGS Hepatobiliary: No focal liver abnormality is seen. Status post cholecystectomy. No intrahepatic biliary ductal dilation. Stable mild dilation of the extrahepatic duct measuring up to 13 mm likely representing post cholecystectomy change. Pancreas: Unremarkable Spleen: Unremarkable Adrenals/Urinary Tract: Adrenal glands are unremarkable. Simple cortical cysts are seen within the kidneys bilaterally for which no follow-up imaging is recommended. The kidneys are otherwise unremarkable. Bladder unremarkable. Stomach/Bowel:  Severe descending and sigmoid diverticulosis. Stomach, small bowel, and large bowel are otherwise unremarkable. Appendix normal. No evidence of obstruction or focal inflammation. No free intraperitoneal gas or fluid. Vascular/Lymphatic: Aortic atherosclerosis. No enlarged abdominal or pelvic lymph nodes. Reproductive: Multiple calcified uterine fibroids are again identified. No adnexal masses are seen. Other: No abdominal wall hernia or abnormality. No abdominopelvic ascites. Musculoskeletal: No acute bone abnormality. No lytic or blastic bone lesion. Osseous structures are age appropriate. IMPRESSION: 1. New 6 mm noncalcified pulmonary nodule within the right lower lobe. Non-contrast chest CT at 6-12 months is recommended. If the nodule is stable at time of repeat CT, then future CT at 18-24 months (from today's scan) is considered optional for low-risk patients, but is recommended for high-risk patients. This recommendation follows the consensus statement: Guidelines for Management of Incidental Pulmonary Nodules Detected on CT Images: From the Fleischner Society 2017; Radiology 2017; 284:228-243. 2. Stable right perihilar post radiation cicatricial changes. 3. Stable spiculated right apical pulmonary nodule in keeping with the patient's treated bronchogenic neoplasm. No evidence of recurrent or residual disease within the chest,, and pelvis. 4. Extensive coronary artery calcification. 5. Enlarged central pulmonary arteries and right ventricle in keeping with changes of pulmonary arterial hypertension and elevated right heart pressure. 6. Hypoattenuation of the cardiac blood pool in keeping with at least mild anemia. 7. Severe emphysema.  New small right pleural effusion. 8. Severe descending and sigmoid diverticulosis. Aortic Atherosclerosis (ICD10-I70.0) and Emphysema (ICD10-J43.9). Electronically Signed   By: Worthy Heads M.D.   On: 08/10/2023 00:36   DG Chest Portable 1 View Result Date: 08/09/2023 EXAM:  1 VIEW(S) XRAY OF THE CHEST 08/09/2023 09:18:00 PM COMPARISON:  CT chest 08/04/2023. CLINICAL HISTORY: Shortness of breath. Patient from home for shortness of breath, weakness, and left side facial droop. EMS reports patient woke up from a nap at 1900 and was normal per son; last known normal around 1930. Denies any recent falls or blood thinners. Patient wears 4L O2 via nasal cannula at baseline due to lung cancer. Upon arrival to ER, patient is alert and oriented, able to move all extremities; left eye closed with left sided facial spasms. ER MD at bedside on arrival, called stroke alert at 2028. FINDINGS: LUNGS AND PLEURA: Stable nodular opacity in the right upper lobe, likely corresponding to the patient's known treated primary bronchogenic carcinoma, better evaluated on CT. New patchy/interstitial opacities in the mid and lower lungs bilaterally, suggesting mild infection/pneumonia. No pleural effusion. No pneumothorax. HEART AND MEDIASTINUM: No acute abnormality of the cardiac and mediastinal silhouettes. Thoracic aortic atherosclerosis. BONES AND SOFT TISSUES: No acute osseous abnormality. Right chest power port terminating at the cavoatrial junction. IMPRESSION: 1. New patchy/interstitial opacities in the mid and lower lungs bilaterally, suggesting mild infection/pneumonia. 2. Stable nodular opacity in the right upper lobe, likely corresponding to the patient's known treated primary bronchogenic carcinoma. Electronically signed by: Zadie Herter MD 08/09/2023 10:05 PM EDT RP Workstation: [id number]   CT HEAD CODE STROKE WO CONTRAST Result Date: 08/09/2023 CLINICAL DATA:  Code stroke. Initial evaluation for acute neuro deficit, stroke suspected. EXAM: CT HEAD WITHOUT CONTRAST TECHNIQUE: Contiguous axial images were obtained from the base of the skull through the vertex without intravenous contrast. RADIATION DOSE REDUCTION: This exam was performed according to the departmental dose-optimization program  which includes automated exposure control, adjustment of the mA and/or kV according to patient size and/or use of iterative reconstruction technique. COMPARISON:  Prior study from 07/16/2022. FINDINGS: Brain: Cerebral volume within normal limits. Mild chronic microvascular ischemic disease for age. No acute intracranial hemorrhage. No acute large vessel territory infarct. No mass lesion or midline shift. No hydrocephalus or extra-axial fluid collection. Vascular: No abnormal hyperdense vessel. Scattered vascular calcifications noted within the carotid siphons. Skull: Scalp soft tissues demonstrate no acute finding. Calvarium intact without fracture. Diffuse osteopenia noted. Sinuses/Orbits: Globes orbital soft tissues within normal limits. Paranasal sinuses and mastoid air cells are largely clear. Other: None. ASPECTS [facility] Stroke Program Early CT Score) - Ganglionic level infarction (caudate, lentiform nuclei, internal capsule, insula, M1-M3 cortex): 7 - Supraganglionic infarction (M4-M6 cortex): 3 Total score (0-10 with 10 being normal): 10 IMPRESSION: 1. No acute intracranial abnormality. 2. ASPECTS is 10. 3. Mild chronic microvascular ischemic disease for age. Results were called by telephone at the time of interpretation on 08/09/2023 at 8:44 pm to provider JULIE HAVILAND , who verbally acknowledged these results. Electronically Signed   By: Virgia Griffins M.D.   On: 08/09/2023 20:44     Discharge Exam: Vitals:   08/12/23 0802 08/12/23 1205  BP:    Pulse:    Resp:    Temp: 98.5 F (36.9 C)   SpO2:  98%   Vitals:   08/12/23 0700 08/12/23 0800 08/12/23 0802 08/12/23 1205  BP: (!) 144/76 127/69    Pulse:      Resp: (!) 23     Temp:   98.5 F (36.9 C)   TempSrc:   Oral   SpO2: 100%   98%  Weight:      Height:        General: Pt is alert, awake, not in acute distress Cardiovascular: RRR, S1/S2 +, no rubs, no  gallops Respiratory: CTA bilaterally, no wheezing, no  rhonchi Abdominal: Soft, NT, ND, bowel sounds + Extremities: no edema, no cyanosis    The results of significant diagnostics from this hospitalization (including imaging, microbiology, ancillary and laboratory) are listed below for reference.     Microbiology: Recent Results (from the past 240 hours)  Resp panel by RT-PCR (RSV, Flu A&B, Covid) Anterior Nasal Swab     Status: None   Collection Time: 08/09/23  8:30 PM   Specimen: Anterior Nasal Swab  Result Value Ref Range Status   SARS Coronavirus 2 by RT PCR NEGATIVE NEGATIVE Final    Comment: (NOTE) SARS-CoV-2 target nucleic acids are NOT DETECTED.  The SARS-CoV-2 RNA is generally detectable in upper respiratory specimens during the acute phase of infection. The lowest concentration of SARS-CoV-2 viral copies this assay can detect is 138 copies/mL. A negative result does not preclude SARS-Cov-2 infection and should not be used as the sole basis for treatment or other patient management decisions. A negative result may occur with  improper specimen collection/handling, submission of specimen other than nasopharyngeal swab, presence of viral mutation(s) within the areas targeted by this assay, and inadequate number of viral copies(<138 copies/mL). A negative result must be combined with clinical observations, patient history, and epidemiological information. The expected result is Negative.  Fact Sheet for Patients:  BloggerCourse.com  Fact Sheet for Healthcare Providers:  SeriousBroker.it  This test is no t yet approved or cleared by the [place]  FDA and  has been authorized for detection and/or diagnosis of SARS-CoV-2 by FDA under an Emergency Use Authorization (EUA). This EUA will remain  in effect (meaning this test can be used) for the duration of the COVID-19 declaration under Section 564(b)(1) of the Act, 21 U.S.C.section 360bbb-3(b)(1), unless the authorization is  terminated  or revoked sooner.       Influenza A by PCR NEGATIVE NEGATIVE Final   Influenza B by PCR NEGATIVE NEGATIVE Final    Comment: (NOTE) The Xpert Xpress SARS-CoV-2/FLU/RSV plus assay is intended as an aid in the diagnosis of influenza from Nasopharyngeal swab specimens and should not be used as a sole basis for treatment. Nasal washings and aspirates are unacceptable for Xpert Xpress SARS-CoV-2/FLU/RSV testing.  Fact Sheet for Patients: BloggerCourse.com  Fact Sheet for Healthcare Providers: SeriousBroker.it  This test is not yet approved or cleared by the [place]  FDA and has been authorized for detection and/or diagnosis of SARS-CoV-2 by FDA under an Emergency Use Authorization (EUA). This EUA will remain in effect (meaning this test can be used) for the duration of the COVID-19 declaration under Section 564(b)(1) of the Act, 21 U.S.C. section 360bbb-3(b)(1), unless the authorization is terminated or revoked.     Resp Syncytial Virus by PCR NEGATIVE NEGATIVE Final    Comment: (NOTE) Fact Sheet for Patients: BloggerCourse.com  Fact Sheet for Healthcare Providers: SeriousBroker.it  This test is not yet approved or cleared by the [place]  FDA and has been authorized for detection and/or diagnosis of SARS-CoV-2 by FDA under an Emergency Use Authorization (EUA). This EUA will remain in effect (meaning this test can be used) for the duration of the COVID-19 declaration under Section 564(b)(1) of the Act, 21 U.S.C. section 360bbb-3(b)(1), unless the authorization is terminated or revoked.  Performed at [facility], [address], [place], [place] [address]   Culture, blood (routine x 2)     Status: None (Preliminary result)   Collection Time: 08/09/23 10:24 PM   Specimen: BLOOD RIGHT FOREARM  Result Value Ref Range Status   Specimen Description BLOOD RIGHT  FOREARM  Final   Special Requests   Final    BOTTLES DRAWN AEROBIC AND ANAEROBIC Blood Culture adequate volume   Culture   Final    NO GROWTH 3 DAYS Performed at [facility], [address], [place], [place] [address]    Report Status PENDING  Incomplete  Culture, blood (routine x 2)     Status: None (Preliminary result)   Collection Time: 08/09/23 10:31 PM   Specimen: BLOOD RIGHT HAND  Result Value Ref Range Status   Specimen Description BLOOD RIGHT HAND  Final   Special Requests   Final    BOTTLES DRAWN AEROBIC AND ANAEROBIC Blood Culture adequate volume   Culture   Final    NO GROWTH 3 DAYS Performed at [facility], [address], [place], [place] [address]    Report Status PENDING  Incomplete  MRSA Next Gen by PCR, Nasal     Status: None   Collection Time: 08/10/23  2:40 AM   Specimen: Nasal Mucosa; Nasal Swab  Result Value Ref Range Status   MRSA by PCR Next Gen NOT DETECTED NOT DETECTED Final    Comment: (NOTE) The GeneXpert MRSA Assay (FDA approved for NASAL specimens only), is one component of a comprehensive MRSA colonization surveillance program. It is not intended to diagnose MRSA infection nor to guide or monitor treatment for MRSA infections. Test performance is not FDA approved in patients less than 28 years old. Performed at [facility], [address] [place], [place] [address]   Respiratory (~20 pathogens) panel by PCR     Status: Abnormal   Collection Time: 08/10/23  7:45 AM   Specimen: Nasopharyngeal Swab; Respiratory  Result Value Ref Range Status   Adenovirus NOT DETECTED NOT DETECTED Final   Coronavirus 229E NOT DETECTED NOT DETECTED Final    Comment: (NOTE) The Coronavirus on the Respiratory Panel, DOES NOT test for the novel  Coronavirus (2019 nCoV)    Coronavirus HKU1 NOT DETECTED NOT DETECTED Final   Coronavirus NL63 NOT DETECTED NOT DETECTED Final   Coronavirus OC43 NOT DETECTED NOT DETECTED Final   Metapneumovirus NOT DETECTED NOT DETECTED  Final   Rhinovirus / Enterovirus DETECTED (A) NOT DETECTED Final   Influenza A NOT DETECTED NOT DETECTED Final   Influenza B NOT DETECTED NOT DETECTED Final   Parainfluenza Virus 1 NOT DETECTED NOT DETECTED Final   Parainfluenza Virus 2 NOT DETECTED NOT DETECTED Final   Parainfluenza Virus 3 NOT DETECTED NOT DETECTED Final   Parainfluenza Virus 4 NOT DETECTED NOT DETECTED Final   Respiratory Syncytial Virus NOT DETECTED NOT DETECTED Final   Bordetella pertussis NOT DETECTED NOT DETECTED Final   Bordetella Parapertussis NOT DETECTED NOT DETECTED Final   Chlamydophila pneumoniae NOT DETECTED NOT DETECTED Final   Mycoplasma pneumoniae NOT DETECTED NOT DETECTED Final    Comment: Performed at [facility], [address], [place], [place] [address]     Labs: BNP (last 3 results) Recent Labs    05/28/23 0908 08/06/23 1103 08/10/23 0444  BNP 100.0 111.2* 354.0*   Basic Metabolic Panel: Recent Labs  Lab 08/06/23 1103 08/09/23 2046 08/10/23 0444 08/11/23 0233 08/12/23 0459  NA 146* 137 142 142 138  K 4.7 4.2 4.6 4.9 4.5  CL 100 95* 99 98 95*  CO2 30* 34* 34* 35* 38*  GLUCOSE 81 139* 116* 145* 141*  BUN 18 15 16  27* 17  CREATININE 0.47*  0.62 0.56 0.50 0.45  CALCIUM  9.2 8.8* 8.9 9.2 9.2  MG  --   --  1.8  --   --   PHOS  --   --  4.2  --   --    Liver Function Tests: Recent Labs  Lab 08/09/23 2046  AST 17  ALT 11  ALKPHOS 231*  BILITOT 0.3  PROT 7.4  ALBUMIN  3.8   No results for input(s): "LIPASE", "AMYLASE" in the last 168 hours. No results for input(s): "AMMONIA" in the last 168 hours. CBC: Recent Labs  Lab 08/06/23 1103 08/09/23 2046 08/10/23 0444 08/11/23 0233 08/12/23 0459  WBC 6.1 6.0 7.0 7.5 5.9  NEUTROABS 4.8 4.6  --   --   --   HGB 9.6* 8.9* 8.6* 7.3* 7.3*  HCT 32.8* 29.4* 27.9* 23.6* 23.2*  MCV 85 80.1 80.2 78.1* 77.3*  PLT 345 258 261 248 257   Cardiac Enzymes: No results for input(s): "CKTOTAL", "CKMB", "CKMBINDEX", "TROPONINI" in the  last 168 hours. BNP: Invalid input(s): "POCBNP" CBG: Recent Labs  Lab 08/09/23 2035  GLUCAP 114*   D-Dimer No results for input(s): "DDIMER" in the last 72 hours. Hgb A1c No results for input(s): "HGBA1C" in the last 72 hours. Lipid Profile No results for input(s): "CHOL", "HDL", "LDLCALC", "TRIG", "CHOLHDL", "LDLDIRECT" in the last 72 hours. Thyroid  function studies No results for input(s): "TSH", "T4TOTAL", "T3FREE", "THYROIDAB" in the last 72 hours.  Invalid input(s): "FREET3" Anemia work up No results for input(s): "VITAMINB12", "FOLATE", "FERRITIN", "TIBC", "IRON ", "RETICCTPCT" in the last 72 hours. Urinalysis    Component Value Date/Time   COLORURINE YELLOW 07/16/2022 1928   APPEARANCEUR CLEAR 07/16/2022 1928   LABSPEC 1.014 07/16/2022 1928   PHURINE 5.0 07/16/2022 1928   GLUCOSEU NEGATIVE 07/16/2022 1928   HGBUR NEGATIVE 07/16/2022 1928   BILIRUBINUR NEGATIVE 07/16/2022 1928   KETONESUR NEGATIVE 07/16/2022 1928   PROTEINUR NEGATIVE 07/16/2022 1928   UROBILINOGEN 0.2 03/31/2014 1406   NITRITE NEGATIVE 07/16/2022 1928   LEUKOCYTESUR NEGATIVE 07/16/2022 1928   Sepsis Labs Recent Labs  Lab 08/09/23 2046 08/10/23 0444 08/11/23 0233 08/12/23 0459  WBC 6.0 7.0 7.5 5.9   Microbiology Recent Results (from the past 240 hours)  Resp panel by RT-PCR (RSV, Flu A&B, Covid) Anterior Nasal Swab     Status: None   Collection Time: 08/09/23  8:30 PM   Specimen: Anterior Nasal Swab  Result Value Ref Range Status   SARS Coronavirus 2 by RT PCR NEGATIVE NEGATIVE Final    Comment: (NOTE) SARS-CoV-2 target nucleic acids are NOT DETECTED.  The SARS-CoV-2 RNA is generally detectable in upper respiratory specimens during the acute phase of infection. The lowest concentration of SARS-CoV-2 viral copies this assay can detect is 138 copies/mL. A negative result does not preclude SARS-Cov-2 infection and should not be used as the sole basis for treatment or other patient  management decisions. A negative result may occur with  improper specimen collection/handling, submission of specimen other than nasopharyngeal swab, presence of viral mutation(s) within the areas targeted by this assay, and inadequate number of viral copies(<138 copies/mL). A negative result must be combined with clinical observations, patient history, and epidemiological information. The expected result is Negative.  Fact Sheet for Patients:  BloggerCourse.com  Fact Sheet for Healthcare Providers:  SeriousBroker.it  This test is no t yet approved or cleared by the [place]  FDA and  has been authorized for detection and/or diagnosis of SARS-CoV-2 by FDA under an Emergency Use Authorization (EUA). This  EUA will remain  in effect (meaning this test can be used) for the duration of the COVID-19 declaration under Section 564(b)(1) of the Act, 21 U.S.C.section 360bbb-3(b)(1), unless the authorization is terminated  or revoked sooner.       Influenza A by PCR NEGATIVE NEGATIVE Final   Influenza B by PCR NEGATIVE NEGATIVE Final    Comment: (NOTE) The Xpert Xpress SARS-CoV-2/FLU/RSV plus assay is intended as an aid in the diagnosis of influenza from Nasopharyngeal swab specimens and should not be used as a sole basis for treatment. Nasal washings and aspirates are unacceptable for Xpert Xpress SARS-CoV-2/FLU/RSV testing.  Fact Sheet for Patients: BloggerCourse.com  Fact Sheet for Healthcare Providers: SeriousBroker.it  This test is not yet approved or cleared by the [place]  FDA and has been authorized for detection and/or diagnosis of SARS-CoV-2 by FDA under an Emergency Use Authorization (EUA). This EUA will remain in effect (meaning this test can be used) for the duration of the COVID-19 declaration under Section 564(b)(1) of the Act, 21 U.S.C. section 360bbb-3(b)(1),  unless the authorization is terminated or revoked.     Resp Syncytial Virus by PCR NEGATIVE NEGATIVE Final    Comment: (NOTE) Fact Sheet for Patients: BloggerCourse.com  Fact Sheet for Healthcare Providers: SeriousBroker.it  This test is not yet approved or cleared by the [place]  FDA and has been authorized for detection and/or diagnosis of SARS-CoV-2 by FDA under an Emergency Use Authorization (EUA). This EUA will remain in effect (meaning this test can be used) for the duration of the COVID-19 declaration under Section 564(b)(1) of the Act, 21 U.S.C. section 360bbb-3(b)(1), unless the authorization is terminated or revoked.  Performed at [facility], [address], [place], [place] [address]   Culture, blood (routine x 2)     Status: None (Preliminary result)   Collection Time: 08/09/23 10:24 PM   Specimen: BLOOD RIGHT FOREARM  Result Value Ref Range Status   Specimen Description BLOOD RIGHT FOREARM  Final   Special Requests   Final    BOTTLES DRAWN AEROBIC AND ANAEROBIC Blood Culture adequate volume   Culture   Final    NO GROWTH 3 DAYS Performed at [facility], [address] [place], [place] [address]    Report Status PENDING  Incomplete  Culture, blood (routine x 2)     Status: None (Preliminary result)   Collection Time: 08/09/23 10:31 PM   Specimen: BLOOD RIGHT HAND  Result Value Ref Range Status   Specimen Description BLOOD RIGHT HAND  Final   Special Requests   Final    BOTTLES DRAWN AEROBIC AND ANAEROBIC Blood Culture adequate volume   Culture   Final    NO GROWTH 3 DAYS Performed at [facility], [address] [place], [place] [address]    Report Status PENDING  Incomplete  MRSA Next Gen by PCR, Nasal     Status: None   Collection Time: 08/10/23  2:40 AM   Specimen: Nasal Mucosa; Nasal Swab  Result Value Ref Range Status   MRSA by PCR Next Gen NOT DETECTED NOT DETECTED Final    Comment:  (NOTE) The GeneXpert MRSA Assay (FDA approved for NASAL specimens only), is one component of a comprehensive MRSA colonization surveillance program. It is not intended to diagnose MRSA infection nor to guide or monitor treatment for MRSA infections. Test performance is not FDA approved in patients less than 67 years old. Performed at [facility], [address], [place], [place] [address]   Respiratory (~  20 pathogens) panel by PCR     Status: Abnormal   Collection Time: 08/10/23  7:45 AM   Specimen: Nasopharyngeal Swab; Respiratory  Result Value Ref Range Status   Adenovirus NOT DETECTED NOT DETECTED Final   Coronavirus 229E NOT DETECTED NOT DETECTED Final    Comment: (NOTE) The Coronavirus on the Respiratory Panel, DOES NOT test for the novel  Coronavirus (2019 nCoV)    Coronavirus HKU1 NOT DETECTED NOT DETECTED Final   Coronavirus NL63 NOT DETECTED NOT DETECTED Final   Coronavirus OC43 NOT DETECTED NOT DETECTED Final   Metapneumovirus NOT DETECTED NOT DETECTED Final   Rhinovirus / Enterovirus DETECTED (A) NOT DETECTED Final   Influenza A NOT DETECTED NOT DETECTED Final   Influenza B NOT DETECTED NOT DETECTED Final   Parainfluenza Virus 1 NOT DETECTED NOT DETECTED Final   Parainfluenza Virus 2 NOT DETECTED NOT DETECTED Final   Parainfluenza Virus 3 NOT DETECTED NOT DETECTED Final   Parainfluenza Virus 4 NOT DETECTED NOT DETECTED Final   Respiratory Syncytial Virus NOT DETECTED NOT DETECTED Final   Bordetella pertussis NOT DETECTED NOT DETECTED Final   Bordetella Parapertussis NOT DETECTED NOT DETECTED Final   Chlamydophila pneumoniae NOT DETECTED NOT DETECTED Final   Mycoplasma pneumoniae NOT DETECTED NOT DETECTED Final    Comment: Performed at [facility], [address] [place], [place] [address]     Time coordinating discharge: 35 minutes  SIGNED:   Cornelius Dill, DO [facility] 08/12/2023, 4:09 PM  If 7PM-7AM, please contact  night-coverage [email or web address]

## 2023-08-14 LAB — CULTURE, BLOOD (ROUTINE X 2)
Culture: NO GROWTH
Culture: NO GROWTH
Special Requests: ADEQUATE
Special Requests: ADEQUATE

## 2023-08-16 ENCOUNTER — Other Ambulatory Visit: Payer: Self-pay | Admitting: Physician Assistant

## 2023-08-16 DIAGNOSIS — C3490 Malignant neoplasm of unspecified part of unspecified bronchus or lung: Secondary | ICD-10-CM

## 2023-08-16 NOTE — Progress Notes (Deleted)
 [facility] OFFICE PROGRESS NOTE  Courtney Chant, FNP [address] [place] [address]  DIAGNOSIS: Recurrent non-small cell lung cancer initially diagnosed as stage IIIA (T1b, N2, M0) non-small cell lung cancer presented with right middle lobe pulmonary nodule, mediastinal lymphadenopathy and highly suspicious for small nodule in the left upper lobe that could change her stage to stage IV that could present another synchronous primary lesion in the left upper lobe. This was diagnosed in September 2017.    PRIOR THERAPY: 1) Concurrent chemoradiation with weekly carboplatin  for AUC of 2 and paclitaxel  45 MG/M2 status post 6 cycles last dose was given 02/25/2016 with partial response. 2) Consolidation chemotherapy with carboplatin  for AUC of 5 and paclitaxel  175 MG/M2 every 3 weeks with Neulasta  support. First dose 04/22/2016. Status post 3 cycles. 3) Second line treatment with immunotherapy with Nivolumab  480 mg IV every 4 weeks status post 32 cycles.  Discontinued secondary to intolerance and frequent hospitalization with pneumonia and pneumonitis.  She had a break of treatment between November 24, 2019 until June 28, 2020. 4) Resuming her treatment with immunotherapy with nivolumab  480 mg IV every 4 weeks. Cycle #33 started on June 28, 2020. Status post 55 cycles   CURRENT THERAPY: Observation   INTERVAL HISTORY: Courtney Zuniga 73 y.o. female returns to the clinic today for follow-up visit.  The patient was last seen in December 2024 by Dr. Marguerita Shih.  In summary the patient has significant underlying emphysema and COPD resulting in hospitalizations.  Therefore she is currently on observation from her lung cancer standpoint as she is been stable.  She is being followed every 4 months with routine imaging.  Since he was last seen by Dr. Marguerita Shih, she was hospitalized twice for acute on chronic respiratory failure secondary to pneumonia.  She was hospitalized in February 2025 and  recently in April 2025.  Since being discharged from the hospital she is feeling ***.   She is currently on supplemental oxygen  at baseline with ***liters which she has been on for several years.  She also has a history of iron  deficiency anemia for which she has received IV iron  infusions in the past.  Iron  supplement?  Anemia?  Today she denies any fever, chills, night sweats, or unexplained weight loss.  She continues to have significant shortness of breath at baseline.  Denies any chest pain, cough, or hemoptysis.  She still gets chronic headaches which she has had for several years and has had multiple brain MRIs which are negative for metastatic disease. She recently had CT head for code stroke while admitted which did not show acute intracranial abnormality.  She denies any rashes or skin changes.  She denies any nausea, vomiting, diarrhea, or constipation.  She sometimes takes MiraLAX  if needed and stool softener for constipation.  She recently had a restaging CT scan.  She is here today for evaluation and to review her scan results.      MEDICAL HISTORY: Past Medical History:  Diagnosis Date   Anemia    as a young woman   Arthritis    osteoartritis   Asthma    Brain tumor (benign) (HCC) 2005 [facility]   Benign   Chronic headaches    Chronic hip pain    Chronic pain    COPD (chronic obstructive pulmonary disease) (HCC)    Coronary artery disease    Depression    Depression 05/15/2016   Encounter for antineoplastic chemotherapy 01/10/2016   GERD (gastroesophageal reflux disease)  Hypertension    Lung cancer (HCC) dx'd 01/2016   currently on chemo and radiation    NSTEMI (non-ST elevated myocardial infarction) (HCC) yrs ago   On home O2    qhs 2 liters at hs and prn   Pneumonia last time 2 yrs ago   Shortness of breath dyspnea    with activity    ALLERGIES:  is allergic to desyrel  [email or web address] ].  MEDICATIONS:  Current Outpatient Medications  Medication Sig Dispense  Refill   albuterol  (PROVENTIL ) (2.5 MG/3ML) 0.083% nebulizer solution Take 3 mLs (2.5 mg total) by nebulization 3 (three) times daily as needed for shortness of breath or wheezing. 75 mL 12   albuterol  (VENTOLIN  HFA) 108 (90 Base) MCG/ACT inhaler Inhale 2 puffs into the lungs every 4 (four) hours as needed for shortness of breath. 18 g 3   Aspirin -Salicylamide-Caffeine  (BC HEADACHE POWDER PO) Take 1 packet by mouth as needed (headache).     benzonatate (TESSALON) 100 MG capsule Take 100 mg by mouth 3 (three) times daily as needed.     bisoprolol  (ZEBETA ) 5 MG tablet Take 0.5 tablets (2.5 mg total) by mouth daily. (Patient taking differently: Take 2.5 mg by mouth daily as needed (high bp).) 45 tablet 1   docusate sodium  (COLACE) 100 MG capsule Take 1 capsule (100 mg total) by mouth 2 (two) times daily. 60 capsule 2   Docusate Sodium  (DSS) 100 MG CAPS Take 1 capsule by mouth daily as needed (constipation).     DULoxetine  (CYMBALTA ) 60 MG capsule Take 1 capsule (60 mg total) by mouth 2 (two) times daily. (Patient not taking: Reported on 08/11/2023) 180 capsule 3   ENSURE (ENSURE) Take 237 mLs by mouth 3 (three) times daily between meals.     ferrous sulfate  325 (65 FE) MG tablet Take 1 tablet (325 mg total) by mouth daily with breakfast. 30 tablet 0   Fluticasone -Umeclidin-Vilant (TRELEGY ELLIPTA ) 100-62.5-25 MCG/ACT AEPB INHALE 1 PUFF BY MOUTH EVERY DAY     Ipratropium-Albuterol  (COMBIVENT  RESPIMAT) 20-100 MCG/ACT AERS respimat Inhale 1 puff into the lungs every 6 (six) hours as needed for wheezing or shortness of breath. 4 g 3   oxyCODONE -acetaminophen  (PERCOCET/ROXICET) 5-325 MG tablet Take 1 tablet by mouth every 8 (eight) hours as needed for moderate pain. 12 tablet 0   pantoprazole  (PROTONIX ) 40 MG tablet Take 1 tablet (40 mg total) by mouth 2 (two) times daily. 60 tablet 11   predniSONE  (DELTASONE ) 10 MG tablet Take 4 tablets (40 mg total) by mouth daily for 5 days. 20 tablet 0   tamsulosin   (FLOMAX ) 0.4 MG CAPS capsule Take 1 capsule (0.4 mg total) by mouth daily after supper. 30 capsule 3   traZODone  (DESYREL ) 50 MG tablet Take 1 tablet (50 mg total) by mouth at bedtime. 30 tablet 5   Vitamin D , Ergocalciferol , (DRISDOL ) 1.25 MG (50000 UNIT) CAPS capsule Take 1 capsule (50,000 Units total) by mouth once a week. 5 capsule 3   No current facility-administered medications for this visit.    SURGICAL HISTORY:  Past Surgical History:  Procedure Laterality Date   CHOLECYSTECTOMY     COLONOSCOPY  2015   Results requested from [facility]   COLONOSCOPY     ESOPHAGOGASTRODUODENOSCOPY N/A 08/14/2015   Procedure: ESOPHAGOGASTRODUODENOSCOPY (EGD);  Surgeon: Suzette Espy, MD;  Location: [facility];  Service: Endoscopy;  Laterality: N/A;  215    ESOPHAGOGASTRODUODENOSCOPY (EGD) WITH PROPOFOL  N/A 09/13/2015   Procedure: ESOPHAGOGASTRODUODENOSCOPY (EGD) WITH PROPOFOL ;  Surgeon: Bearl Limes  Janece Means, MD;  Location: [facility];  Service: Endoscopy;  Laterality: N/A;   ESOPHAGOGASTRODUODENOSCOPY (EGD) WITH PROPOFOL  N/A 05/19/2022   Procedure: ESOPHAGOGASTRODUODENOSCOPY (EGD) WITH PROPOFOL ;  Surgeon: Vinetta Greening, DO;  Location: [facility];  Service: Endoscopy;  Laterality: N/A;   EUS N/A 03/12/2017   Procedure: UPPER ENDOSCOPIC ULTRASOUND (EUS) RADIAL;  Surgeon: Janel Medford, MD;  Location: [facility];  Service: Endoscopy;  Laterality: N/A;   HOT HEMOSTASIS  05/19/2022   Procedure: HOT HEMOSTASIS (ARGON PLASMA COAGULATION/BICAP);  Surgeon: Vinetta Greening, DO;  Location: [facility];  Service: Endoscopy;;   IR IMAGING GUIDED PORT INSERTION  01/20/2022   TUMOR REMOVAL  2005   Benign   UPPER ESOPHAGEAL ENDOSCOPIC ULTRASOUND (EUS)  09/13/2015   Procedure: UPPER ESOPHAGEAL ENDOSCOPIC ULTRASOUND (EUS);  Surgeon: Janel Medford, MD;  Location: [facility];  Service: Endoscopy;;   VIDEO BRONCHOSCOPY WITH ENDOBRONCHIAL NAVIGATION N/A 12/31/2015   Procedure: VIDEO BRONCHOSCOPY  WITH ENDOBRONCHIAL NAVIGATION;  Surgeon: Zelphia Higashi, MD;  Location: [facility] OR;  Service: Thoracic;  Laterality: N/A;   VIDEO BRONCHOSCOPY WITH ENDOBRONCHIAL ULTRASOUND N/A 11/08/2015   Procedure: VIDEO BRONCHOSCOPY WITH ENDOBRONCHIAL ULTRASOUND;  Surgeon: Heriberto London, MD;  Location: [facility] OR;  Service: Thoracic;  Laterality: N/A;    REVIEW OF SYSTEMS:   Review of Systems  Constitutional: Negative for appetite change, chills, fatigue, fever and unexpected weight change.  HENT:   Negative for mouth sores, nosebleeds, sore throat and trouble swallowing.   Eyes: Negative for eye problems and icterus.  Respiratory: Negative for cough, hemoptysis, shortness of breath and wheezing.   Cardiovascular: Negative for chest pain and leg swelling.  Gastrointestinal: Negative for abdominal pain, constipation, diarrhea, nausea and vomiting.  Genitourinary: Negative for bladder incontinence, difficulty urinating, dysuria, frequency and hematuria.   Musculoskeletal: Negative for back pain, gait problem, neck pain and neck stiffness.  Skin: Negative for itching and rash.  Neurological: Negative for dizziness, extremity weakness, gait problem, headaches, light-headedness and seizures.  Hematological: Negative for adenopathy. Does not bruise/bleed easily.  Psychiatric/Behavioral: Negative for confusion, depression and sleep disturbance. The patient is not nervous/anxious.     PHYSICAL EXAMINATION:  There were no vitals taken for this visit.  ECOG PERFORMANCE STATUS: {CHL ONC ECOG [id number]  Physical Exam  Constitutional: Oriented to person, place, and time and well-developed, well-nourished, and in no distress. No distress.  HENT:  Head: Normocephalic and atraumatic.  Mouth/Throat: Oropharynx is clear and moist. No oropharyngeal exudate.  Eyes: Conjunctivae are normal. Right eye exhibits no discharge. Left eye exhibits no discharge. No scleral icterus.  Neck: Normal range of motion. Neck  supple.  Cardiovascular: Normal rate, regular rhythm, normal heart sounds and intact distal pulses.   Pulmonary/Chest: Effort normal and breath sounds normal. No respiratory distress. No wheezes. No rales.  Abdominal: Soft. Bowel sounds are normal. Exhibits no distension and no mass. There is no tenderness.  Musculoskeletal: Normal range of motion. Exhibits no edema.  Lymphadenopathy:    No cervical adenopathy.  Neurological: Alert and oriented to person, place, and time. Exhibits normal muscle tone. Gait normal. Coordination normal.  Skin: Skin is warm and dry. No rash noted. Not diaphoretic. No erythema. No pallor.  Psychiatric: Mood, memory and judgment normal.  Vitals reviewed.  LABORATORY DATA: Lab Results  Component Value Date   WBC 5.9 08/12/2023   HGB 7.3 (L) 08/12/2023   HCT 23.2 (L) 08/12/2023   MCV 77.3 (L) 08/12/2023   PLT 257 08/12/2023  Chemistry      Component Value Date/Time   NA 138 08/12/2023 0459   NA 146 (H) 08/06/2023 1103   NA 144 04/09/2017 0948   K 4.5 08/12/2023 0459   K 4.3 04/09/2017 0948   CL 95 (L) 08/12/2023 0459   CO2 38 (H) 08/12/2023 0459   CO2 25 04/09/2017 0948   BUN 17 08/12/2023 0459   BUN 18 08/06/2023 1103   BUN 16.8 04/09/2017 0948   CREATININE 0.45 08/12/2023 0459   CREATININE 0.62 12/08/2022 0834   CREATININE 0.8 04/09/2017 0948      Component Value Date/Time   CALCIUM  9.2 08/12/2023 0459   CALCIUM  9.1 04/09/2017 0948   ALKPHOS 231 (H) 08/09/2023 2046   ALKPHOS 216 (H) 04/09/2017 0948   AST 17 08/09/2023 2046   AST 18 12/08/2022 0834   AST 12 04/09/2017 0948   ALT 11 08/09/2023 2046   ALT 9 12/08/2022 0834   ALT 13 04/09/2017 0948   BILITOT 0.3 08/09/2023 2046   BILITOT 0.2 (L) 12/08/2022 0834   BILITOT 0.22 04/09/2017 0948       RADIOGRAPHIC STUDIES:  CT Chest Wo Contrast Result Date: 08/10/2023 CLINICAL DATA:  Non-small cell lung cancer status post radiation therapy completed in 2017 with ongoing  chemotherapy. Restaging examination. Intermittent chest pain and nocturnal cough. * Tracking Code: BO * EXAM: CT CHEST, ABDOMEN AND PELVIS WITHOUT CONTRAST TECHNIQUE: Multidetector CT imaging of the chest, abdomen and pelvis was performed following the standard protocol without IV contrast. RADIATION DOSE REDUCTION: This exam was performed according to the departmental dose-optimization program which includes automated exposure control, adjustment of the mA and/or kV according to patient size and/or use of iterative reconstruction technique. COMPARISON:  05/28/2023 FINDINGS: CT CHEST FINDINGS Cardiovascular: Extensive coronary artery calcification. Calcification of the aortic valve leaflets. Global cardiac size within limits of there is notable enlargement of the right ventricle in keeping with elevated right heart pressure. Central pulmonary arteries are stably enlarged in keeping with changes of pulmonary arterial hypertension. Hypoattenuation of the cardiac blood pool in keeping with at least mild anemia. No pericardial effusion. Moderate atherosclerotic calcification within the thoracic aorta. No aortic aneurysm. Right internal jugular chest port tip seen at the superior cavoatrial junction. Mediastinum/Nodes: No enlarged mediastinal, hilar, or axillary lymph nodes. Thyroid  gland, trachea, and esophagus demonstrate no significant findings. Lungs/Pleura: Stable spiculated nodule within the right apex and an area of parenchymal scarring measuring 1.9 x 1.3 cm at axial image # 30/7. Stable bandlike scarring within the subpleural right lower lobe, axial image # 101/7. New mean 6 mm noncalcified pulmonary nodule, axial image # 117/7, right lower lobe. Right perihilar post radiation cicatricial changes are again identified and are stable. Severe emphysema with stable pulmonary hyperinflation. Interval development small right pleural effusion. No pneumothorax. No central obstructing lesion Musculoskeletal: No acute  bone abnormality. No lytic or blastic bone lesion. Osseous structures are age appropriate. CT ABDOMEN PELVIS FINDINGS Hepatobiliary: No focal liver abnormality is seen. Status post cholecystectomy. No intrahepatic biliary ductal dilation. Stable mild dilation of the extrahepatic duct measuring up to 13 mm likely representing post cholecystectomy change. Pancreas: Unremarkable Spleen: Unremarkable Adrenals/Urinary Tract: Adrenal glands are unremarkable. Simple cortical cysts are seen within the kidneys bilaterally for which no follow-up imaging is recommended. The kidneys are otherwise unremarkable. Bladder unremarkable. Stomach/Bowel: Severe descending and sigmoid diverticulosis. Stomach, small bowel, and large bowel are otherwise unremarkable. Appendix normal. No evidence of obstruction or focal inflammation. No free intraperitoneal gas or fluid. Vascular/Lymphatic:  Aortic atherosclerosis. No enlarged abdominal or pelvic lymph nodes. Reproductive: Multiple calcified uterine fibroids are again identified. No adnexal masses are seen. Other: No abdominal wall hernia or abnormality. No abdominopelvic ascites. Musculoskeletal: No acute bone abnormality. No lytic or blastic bone lesion. Osseous structures are age appropriate. IMPRESSION: 1. New 6 mm noncalcified pulmonary nodule within the right lower lobe. Non-contrast chest CT at 6-12 months is recommended. If the nodule is stable at time of repeat CT, then future CT at 18-24 months (from today's scan) is considered optional for low-risk patients, but is recommended for high-risk patients. This recommendation follows the consensus statement: Guidelines for Management of Incidental Pulmonary Nodules Detected on CT Images: From the Fleischner Society 2017; Radiology 2017; 284:228-243. 2. Stable right perihilar post radiation cicatricial changes. 3. Stable spiculated right apical pulmonary nodule in keeping with the patient's treated bronchogenic neoplasm. No evidence of  recurrent or residual disease within the chest,, and pelvis. 4. Extensive coronary artery calcification. 5. Enlarged central pulmonary arteries and right ventricle in keeping with changes of pulmonary arterial hypertension and elevated right heart pressure. 6. Hypoattenuation of the cardiac blood pool in keeping with at least mild anemia. 7. Severe emphysema.  New small right pleural effusion. 8. Severe descending and sigmoid diverticulosis. Aortic Atherosclerosis (ICD10-I70.0) and Emphysema (ICD10-J43.9). Electronically Signed   By: Worthy Heads M.D.   On: 08/10/2023 00:36   CT ABDOMEN PELVIS WO CONTRAST Result Date: 08/10/2023 CLINICAL DATA:  Non-small cell lung cancer status post radiation therapy completed in 2017 with ongoing chemotherapy. Restaging examination. Intermittent chest pain and nocturnal cough. * Tracking Code: BO * EXAM: CT CHEST, ABDOMEN AND PELVIS WITHOUT CONTRAST TECHNIQUE: Multidetector CT imaging of the chest, abdomen and pelvis was performed following the standard protocol without IV contrast. RADIATION DOSE REDUCTION: This exam was performed according to the departmental dose-optimization program which includes automated exposure control, adjustment of the mA and/or kV according to patient size and/or use of iterative reconstruction technique. COMPARISON:  05/28/2023 FINDINGS: CT CHEST FINDINGS Cardiovascular: Extensive coronary artery calcification. Calcification of the aortic valve leaflets. Global cardiac size within limits of there is notable enlargement of the right ventricle in keeping with elevated right heart pressure. Central pulmonary arteries are stably enlarged in keeping with changes of pulmonary arterial hypertension. Hypoattenuation of the cardiac blood pool in keeping with at least mild anemia. No pericardial effusion. Moderate atherosclerotic calcification within the thoracic aorta. No aortic aneurysm. Right internal jugular chest port tip seen at the superior  cavoatrial junction. Mediastinum/Nodes: No enlarged mediastinal, hilar, or axillary lymph nodes. Thyroid  gland, trachea, and esophagus demonstrate no significant findings. Lungs/Pleura: Stable spiculated nodule within the right apex and an area of parenchymal scarring measuring 1.9 x 1.3 cm at axial image # 30/7. Stable bandlike scarring within the subpleural right lower lobe, axial image # 101/7. New mean 6 mm noncalcified pulmonary nodule, axial image # 117/7, right lower lobe. Right perihilar post radiation cicatricial changes are again identified and are stable. Severe emphysema with stable pulmonary hyperinflation. Interval development small right pleural effusion. No pneumothorax. No central obstructing lesion Musculoskeletal: No acute bone abnormality. No lytic or blastic bone lesion. Osseous structures are age appropriate. CT ABDOMEN PELVIS FINDINGS Hepatobiliary: No focal liver abnormality is seen. Status post cholecystectomy. No intrahepatic biliary ductal dilation. Stable mild dilation of the extrahepatic duct measuring up to 13 mm likely representing post cholecystectomy change. Pancreas: Unremarkable Spleen: Unremarkable Adrenals/Urinary Tract: Adrenal glands are unremarkable. Simple cortical cysts are seen within the kidneys bilaterally for  which no follow-up imaging is recommended. The kidneys are otherwise unremarkable. Bladder unremarkable. Stomach/Bowel: Severe descending and sigmoid diverticulosis. Stomach, small bowel, and large bowel are otherwise unremarkable. Appendix normal. No evidence of obstruction or focal inflammation. No free intraperitoneal gas or fluid. Vascular/Lymphatic: Aortic atherosclerosis. No enlarged abdominal or pelvic lymph nodes. Reproductive: Multiple calcified uterine fibroids are again identified. No adnexal masses are seen. Other: No abdominal wall hernia or abnormality. No abdominopelvic ascites. Musculoskeletal: No acute bone abnormality. No lytic or blastic bone  lesion. Osseous structures are age appropriate. IMPRESSION: 1. New 6 mm noncalcified pulmonary nodule within the right lower lobe. Non-contrast chest CT at 6-12 months is recommended. If the nodule is stable at time of repeat CT, then future CT at 18-24 months (from today's scan) is considered optional for low-risk patients, but is recommended for high-risk patients. This recommendation follows the consensus statement: Guidelines for Management of Incidental Pulmonary Nodules Detected on CT Images: From the Fleischner Society 2017; Radiology 2017; 284:228-243. 2. Stable right perihilar post radiation cicatricial changes. 3. Stable spiculated right apical pulmonary nodule in keeping with the patient's treated bronchogenic neoplasm. No evidence of recurrent or residual disease within the chest,, and pelvis. 4. Extensive coronary artery calcification. 5. Enlarged central pulmonary arteries and right ventricle in keeping with changes of pulmonary arterial hypertension and elevated right heart pressure. 6. Hypoattenuation of the cardiac blood pool in keeping with at least mild anemia. 7. Severe emphysema.  New small right pleural effusion. 8. Severe descending and sigmoid diverticulosis. Aortic Atherosclerosis (ICD10-I70.0) and Emphysema (ICD10-J43.9). Electronically Signed   By: Worthy Heads M.D.   On: 08/10/2023 00:36   DG Chest Portable 1 View Result Date: 08/09/2023 EXAM: 1 VIEW(S) XRAY OF THE CHEST 08/09/2023 09:18:00 PM COMPARISON: CT chest 08/04/2023. CLINICAL HISTORY: Shortness of breath. Patient from home for shortness of breath, weakness, and left side facial droop. EMS reports patient woke up from a nap at 1900 and was normal per son; last known normal around 1930. Denies any recent falls or blood thinners. Patient wears 4L O2 via nasal cannula at baseline due to lung cancer. Upon arrival to ER, patient is alert and oriented, able to move all extremities; left eye closed with left sided facial spasms. ER  MD at bedside on arrival, called stroke alert at 2028. FINDINGS: LUNGS AND PLEURA: Stable nodular opacity in the right upper lobe, likely corresponding to the patient's known treated primary bronchogenic carcinoma, better evaluated on CT. New patchy/interstitial opacities in the mid and lower lungs bilaterally, suggesting mild infection/pneumonia. No pleural effusion. No pneumothorax. HEART AND MEDIASTINUM: No acute abnormality of the cardiac and mediastinal silhouettes. Thoracic aortic atherosclerosis. BONES AND SOFT TISSUES: No acute osseous abnormality. Right chest power port terminating at the cavoatrial junction. IMPRESSION: 1. New patchy/interstitial opacities in the mid and lower lungs bilaterally, suggesting mild infection/pneumonia. 2. Stable nodular opacity in the right upper lobe, likely corresponding to the patient's known treated primary bronchogenic carcinoma. Electronically signed by: Zadie Herter MD 08/09/2023 10:05 PM EDT RP Workstation: [id number]   CT HEAD CODE STROKE WO CONTRAST Result Date: 08/09/2023 CLINICAL DATA:  Code stroke. Initial evaluation for acute neuro deficit, stroke suspected. EXAM: CT HEAD WITHOUT CONTRAST TECHNIQUE: Contiguous axial images were obtained from the base of the skull through the vertex without intravenous contrast. RADIATION DOSE REDUCTION: This exam was performed according to the departmental dose-optimization program which includes automated exposure control, adjustment of the mA and/or kV according to patient size and/or use of iterative reconstruction technique.  COMPARISON:  Prior study from 07/16/2022. FINDINGS: Brain: Cerebral volume within normal limits. Mild chronic microvascular ischemic disease for age. No acute intracranial hemorrhage. No acute large vessel territory infarct. No mass lesion or midline shift. No hydrocephalus or extra-axial fluid collection. Vascular: No abnormal hyperdense vessel. Scattered vascular calcifications noted within the  carotid siphons. Skull: Scalp soft tissues demonstrate no acute finding. Calvarium intact without fracture. Diffuse osteopenia noted. Sinuses/Orbits: Globes orbital soft tissues within normal limits. Paranasal sinuses and mastoid air cells are largely clear. Other: None. ASPECTS [facility] Stroke Program Early CT Score) - Ganglionic level infarction (caudate, lentiform nuclei, internal capsule, insula, M1-M3 cortex): 7 - Supraganglionic infarction (M4-M6 cortex): 3 Total score (0-10 with 10 being normal): 10 IMPRESSION: 1. No acute intracranial abnormality. 2. ASPECTS is 10. 3. Mild chronic microvascular ischemic disease for age. Results were called by telephone at the time of interpretation on 08/09/2023 at 8:44 pm to provider JULIE HAVILAND , who verbally acknowledged these results. Electronically Signed   By: Virgia Griffins M.D.   On: 08/09/2023 20:44     ASSESSMENT/PLAN:  This is a very pleasant 73 year old African-American female with recurrent metastatic non-small cell lung cancer, adenocarcinoma of the right middle lobe.  She was initially diagnosed as stage IIIa in September 2017.     She completed concurrent chemoradiation followed by consolidation with carboplatin  and paclitaxel . She is status post 3 cycles. The patient was on observation but a restaging CT scan showed evidence of disease progression.   She then was undergoing treatment with second line with immunotherapy with nivolumab  480 mg IV every 4 weeks.  She was status post 31 cycles. Her treatment had been on hold since August 2021 until her COPD can get under control. She showed evidence of some recurrent disease in March 2022 and she resumed her treatment with cycle number cycle #33 of single agent nivolumab  480 mg IV every 4 weeks in March 2022 status post 55 cycles. This was placed on hold again in December 2023 due to She was on a break from treatment from December 2023 due to hospitalizations for COPD exacerbation GI bleed  secondary to [facility] powder. Therefore, she has been on observation.   The patient was seen with Dr. Marguerita Shih today.  Dr. Marguerita Shih personally and independently reviewed the scan and discussed results with the patient today.  The scan showed ***.  Dr. Marguerita Shih recommends ***  Dr. Marguerita Shih recommends she continue on observation with a repeat CT scan in 4 months.   BP  Iron  supplements.   Flush appointments in [place] ***  The patient was advised to call immediately if she has any concerning symptoms in the interval. The patient voices understanding of current disease status and treatment options and is in agreement with the current care plan. All questions were answered. The patient knows to call the clinic with any problems, questions or concerns. We can certainly see the patient much sooner if necessary        No orders of the defined types were placed in this encounter.    I spent {CHL ONC TIME VISIT - KGMWN:[id number]} counseling the patient face to face. The total time spent in the appointment was {CHL ONC TIME VISIT - IHKVQ:[id number]}.  Courtney Bernier L Amarri Satterly, PA-C 08/16/23

## 2023-08-17 ENCOUNTER — Inpatient Hospital Stay: Payer: 59

## 2023-08-17 ENCOUNTER — Telehealth: Payer: Self-pay

## 2023-08-17 ENCOUNTER — Inpatient Hospital Stay: Payer: 59 | Admitting: Physician Assistant

## 2023-08-17 ENCOUNTER — Telehealth: Payer: Self-pay | Admitting: Physician Assistant

## 2023-08-17 NOTE — Telephone Encounter (Signed)
 Tried to reach patient in regards to missed appts today.  VM box was full.    Spoke with patients daughter, [name] and she stated that she did not know about the appt but will try to get in touch with her mom to find out why she missed appt.  Informed daughter to call with any concerns.  Schedule message sent to reschedule patient.

## 2023-08-17 NOTE — Telephone Encounter (Signed)
 Rescheduled missed appointments. The patient is aware of the appointment details and will be mailed an appointment reminder.

## 2023-08-21 NOTE — Progress Notes (Unsigned)
 [facility] OFFICE PROGRESS NOTE  Caresse Chant, FNP [address] [place] [address]  DIAGNOSIS: Recurrent non-small cell lung cancer initially diagnosed as stage IIIA (T1b, N2, M0) non-small cell lung cancer presented with right middle lobe pulmonary nodule, mediastinal lymphadenopathy and highly suspicious for small nodule in the left upper lobe that could change her stage to stage IV that could present another synchronous primary lesion in the left upper lobe. This was diagnosed in September 2017.    PRIOR THERAPY: 1) Concurrent chemoradiation with weekly carboplatin  for AUC of 2 and paclitaxel  45 MG/M2 status post 6 cycles last dose was given 02/25/2016 with partial response. 2) Consolidation chemotherapy with carboplatin  for AUC of 5 and paclitaxel  175 MG/M2 every 3 weeks with Neulasta  support. First dose 04/22/2016. Status post 3 cycles. 3) Second line treatment with immunotherapy with Nivolumab  480 mg IV every 4 weeks status post 32 cycles.  Discontinued secondary to intolerance and frequent hospitalization with pneumonia and pneumonitis.  She had a break of treatment between November 24, 2019 until June 28, 2020. 4) Resuming her treatment with immunotherapy with nivolumab  480 mg IV every 4 weeks. Cycle #33 started on June 28, 2020. Status post 55 cycles   CURRENT THERAPY: Observation   INTERVAL HISTORY: Courtney Zuniga 73 y.o. female returns to the clinic today for follow-up visit.  The patient was last seen in December 2024 by Dr. Marguerita Shih.  In summary the patient has significant underlying emphysema and COPD resulting in hospitalizations.  Therefore she is currently on observation from her lung cancer standpoint as she is been stable.  She is being followed every 4 months with routine imaging.  Since he was last seen by Dr. Marguerita Shih, she was hospitalized twice for acute on chronic respiratory failure secondary to pneumonia.  She was hospitalized in February 2025 and  recently in April 2025.  Since being discharged from the hospital she is feeling ***.   She is currently on supplemental oxygen  at baseline with ***liters which she has been on for several years.  She also has a history of iron  deficiency anemia for which she has received IV iron  infusions in the past.  Iron  supplement?  Anemia?  Today she denies any fever, chills, night sweats, or unexplained weight loss.  She continues to have significant shortness of breath at baseline.  Denies any chest pain, cough, or hemoptysis.  She still gets chronic headaches which she has had for several years and has had multiple brain MRIs which are negative for metastatic disease. She recently had CT head for code stroke while admitted which did not show acute intracranial abnormality.  She denies any rashes or skin changes.  She denies any nausea, vomiting, diarrhea, or constipation.  She sometimes takes MiraLAX  if needed and stool softener for constipation.  She recently had a restaging CT scan.  She is here today for evaluation and to review her scan results.      MEDICAL HISTORY: Past Medical History:  Diagnosis Date   Anemia    as a young woman   Arthritis    osteoartritis   Asthma    Brain tumor (benign) (HCC) 2005 [facility]   Benign   Chronic headaches    Chronic hip pain    Chronic pain    COPD (chronic obstructive pulmonary disease) (HCC)    Coronary artery disease    Depression    Depression 05/15/2016   Encounter for antineoplastic chemotherapy 01/10/2016   GERD (gastroesophageal reflux disease)  Hypertension    Lung cancer (HCC) dx'd 01/2016   currently on chemo and radiation    NSTEMI (non-ST elevated myocardial infarction) (HCC) yrs ago   On home O2    qhs 2 liters at hs and prn   Pneumonia last time 2 yrs ago   Shortness of breath dyspnea    with activity    ALLERGIES:  is allergic to desyrel  [trazodone ].  MEDICATIONS:  Current Outpatient Medications  Medication Sig Dispense  Refill   albuterol  (PROVENTIL ) (2.5 MG/3ML) 0.083% nebulizer solution Take 3 mLs (2.5 mg total) by nebulization 3 (three) times daily as needed for shortness of breath or wheezing. 75 mL 12   albuterol  (VENTOLIN  HFA) 108 (90 Base) MCG/ACT inhaler Inhale 2 puffs into the lungs every 4 (four) hours as needed for shortness of breath. 18 g 3   Aspirin -Salicylamide-Caffeine  (BC HEADACHE POWDER PO) Take 1 packet by mouth as needed (headache).     benzonatate (TESSALON) 100 MG capsule Take 100 mg by mouth 3 (three) times daily as needed.     bisoprolol  (ZEBETA ) 5 MG tablet Take 0.5 tablets (2.5 mg total) by mouth daily. (Patient taking differently: Take 2.5 mg by mouth daily as needed (high bp).) 45 tablet 1   docusate sodium  (COLACE) 100 MG capsule Take 1 capsule (100 mg total) by mouth 2 (two) times daily. 60 capsule 2   Docusate Sodium  (DSS) 100 MG CAPS Take 1 capsule by mouth daily as needed (constipation).     DULoxetine  (CYMBALTA ) 60 MG capsule Take 1 capsule (60 mg total) by mouth 2 (two) times daily. (Patient not taking: Reported on 08/11/2023) 180 capsule 3   ENSURE (ENSURE) Take 237 mLs by mouth 3 (three) times daily between meals.     ferrous sulfate  325 (65 FE) MG tablet Take 1 tablet (325 mg total) by mouth daily with breakfast. 30 tablet 0   Fluticasone -Umeclidin-Vilant (TRELEGY ELLIPTA ) 100-62.5-25 MCG/ACT AEPB INHALE 1 PUFF BY MOUTH EVERY DAY     Ipratropium-Albuterol  (COMBIVENT  RESPIMAT) 20-100 MCG/ACT AERS respimat Inhale 1 puff into the lungs every 6 (six) hours as needed for wheezing or shortness of breath. 4 g 3   oxyCODONE -acetaminophen  (PERCOCET/ROXICET) 5-325 MG tablet Take 1 tablet by mouth every 8 (eight) hours as needed for moderate pain. 12 tablet 0   pantoprazole  (PROTONIX ) 40 MG tablet Take 1 tablet (40 mg total) by mouth 2 (two) times daily. 60 tablet 11   tamsulosin  (FLOMAX ) 0.4 MG CAPS capsule Take 1 capsule (0.4 mg total) by mouth daily after supper. 30 capsule 3   traZODone   (DESYREL ) 50 MG tablet Take 1 tablet (50 mg total) by mouth at bedtime. 30 tablet 5   Vitamin D , Ergocalciferol , (DRISDOL ) 1.25 MG (50000 UNIT) CAPS capsule Take 1 capsule (50,000 Units total) by mouth once a week. 5 capsule 3   No current facility-administered medications for this visit.    SURGICAL HISTORY:  Past Surgical History:  Procedure Laterality Date   CHOLECYSTECTOMY     COLONOSCOPY  2015   Results requested from [facility]   COLONOSCOPY     ESOPHAGOGASTRODUODENOSCOPY N/A 08/14/2015   Procedure: ESOPHAGOGASTRODUODENOSCOPY (EGD);  Surgeon: Suzette Espy, MD;  Location: [facility];  Service: Endoscopy;  Laterality: N/A;  215    ESOPHAGOGASTRODUODENOSCOPY (EGD) WITH PROPOFOL  N/A 09/13/2015   Procedure: ESOPHAGOGASTRODUODENOSCOPY (EGD) WITH PROPOFOL ;  Surgeon: Janel Medford, MD;  Location: [facility];  Service: Endoscopy;  Laterality: N/A;   ESOPHAGOGASTRODUODENOSCOPY (EGD) WITH PROPOFOL  N/A 05/19/2022  Procedure: ESOPHAGOGASTRODUODENOSCOPY (EGD) WITH PROPOFOL ;  Surgeon: Vinetta Greening, DO;  Location: [facility];  Service: Endoscopy;  Laterality: N/A;   EUS N/A 03/12/2017   Procedure: UPPER ENDOSCOPIC ULTRASOUND (EUS) RADIAL;  Surgeon: Janel Medford, MD;  Location: [facility];  Service: Endoscopy;  Laterality: N/A;   HOT HEMOSTASIS  05/19/2022   Procedure: HOT HEMOSTASIS (ARGON PLASMA COAGULATION/BICAP);  Surgeon: Vinetta Greening, DO;  Location: [facility];  Service: Endoscopy;;   IR IMAGING GUIDED PORT INSERTION  01/20/2022   TUMOR REMOVAL  2005   Benign   UPPER ESOPHAGEAL ENDOSCOPIC ULTRASOUND (EUS)  09/13/2015   Procedure: UPPER ESOPHAGEAL ENDOSCOPIC ULTRASOUND (EUS);  Surgeon: Janel Medford, MD;  Location: [facility];  Service: Endoscopy;;   VIDEO BRONCHOSCOPY WITH ENDOBRONCHIAL NAVIGATION N/A 12/31/2015   Procedure: VIDEO BRONCHOSCOPY WITH ENDOBRONCHIAL NAVIGATION;  Surgeon: Zelphia Higashi, MD;  Location: [facility];  Service: Thoracic;   Laterality: N/A;   VIDEO BRONCHOSCOPY WITH ENDOBRONCHIAL ULTRASOUND N/A 11/08/2015   Procedure: VIDEO BRONCHOSCOPY WITH ENDOBRONCHIAL ULTRASOUND;  Surgeon: Heriberto London, MD;  Location: [facility] OR;  Service: Thoracic;  Laterality: N/A;    REVIEW OF SYSTEMS:   Review of Systems  Constitutional: Negative for appetite change, chills, fatigue, fever and unexpected weight change.  HENT:   Negative for mouth sores, nosebleeds, sore throat and trouble swallowing.   Eyes: Negative for eye problems and icterus.  Respiratory: Negative for cough, hemoptysis, shortness of breath and wheezing.   Cardiovascular: Negative for chest pain and leg swelling.  Gastrointestinal: Negative for abdominal pain, constipation, diarrhea, nausea and vomiting.  Genitourinary: Negative for bladder incontinence, difficulty urinating, dysuria, frequency and hematuria.   Musculoskeletal: Negative for back pain, gait problem, neck pain and neck stiffness.  Skin: Negative for itching and rash.  Neurological: Negative for dizziness, extremity weakness, gait problem, headaches, light-headedness and seizures.  Hematological: Negative for adenopathy. Does not bruise/bleed easily.  Psychiatric/Behavioral: Negative for confusion, depression and sleep disturbance. The patient is not nervous/anxious.     PHYSICAL EXAMINATION:  There were no vitals taken for this visit.  ECOG PERFORMANCE STATUS: {CHL ONC ECOG [id number]  Physical Exam  Constitutional: Oriented to person, place, and time and well-developed, well-nourished, and in no distress. No distress.  HENT:  Head: Normocephalic and atraumatic.  Mouth/Throat: Oropharynx is clear and moist. No oropharyngeal exudate.  Eyes: Conjunctivae are normal. Right eye exhibits no discharge. Left eye exhibits no discharge. No scleral icterus.  Neck: Normal range of motion. Neck supple.  Cardiovascular: Normal rate, regular rhythm, normal heart sounds and intact distal pulses.    Pulmonary/Chest: Effort normal and breath sounds normal. No respiratory distress. No wheezes. No rales.  Abdominal: Soft. Bowel sounds are normal. Exhibits no distension and no mass. There is no tenderness.  Musculoskeletal: Normal range of motion. Exhibits no edema.  Lymphadenopathy:    No cervical adenopathy.  Neurological: Alert and oriented to person, place, and time. Exhibits normal muscle tone. Gait normal. Coordination normal.  Skin: Skin is warm and dry. No rash noted. Not diaphoretic. No erythema. No pallor.  Psychiatric: Mood, memory and judgment normal.  Vitals reviewed.  LABORATORY DATA: Lab Results  Component Value Date   WBC 5.9 08/12/2023   HGB 7.3 (L) 08/12/2023   HCT 23.2 (L) 08/12/2023   MCV 77.3 (L) 08/12/2023   PLT 257 08/12/2023      Chemistry      Component Value Date/Time   NA 138 08/12/2023 0459   NA 146 (H) 08/06/2023 1103  NA 144 04/09/2017 0948   K 4.5 08/12/2023 0459   K 4.3 04/09/2017 0948   CL 95 (L) 08/12/2023 0459   CO2 38 (H) 08/12/2023 0459   CO2 25 04/09/2017 0948   BUN 17 08/12/2023 0459   BUN 18 08/06/2023 1103   BUN 16.8 04/09/2017 0948   CREATININE 0.45 08/12/2023 0459   CREATININE 0.62 12/08/2022 0834   CREATININE 0.8 04/09/2017 0948      Component Value Date/Time   CALCIUM  9.2 08/12/2023 0459   CALCIUM  9.1 04/09/2017 0948   ALKPHOS 231 (H) 08/09/2023 2046   ALKPHOS 216 (H) 04/09/2017 0948   AST 17 08/09/2023 2046   AST 18 12/08/2022 0834   AST 12 04/09/2017 0948   ALT 11 08/09/2023 2046   ALT 9 12/08/2022 0834   ALT 13 04/09/2017 0948   BILITOT 0.3 08/09/2023 2046   BILITOT 0.2 (L) 12/08/2022 0834   BILITOT 0.22 04/09/2017 0948       RADIOGRAPHIC STUDIES:  CT Chest Wo Contrast Result Date: 08/10/2023 CLINICAL DATA:  Non-small cell lung cancer status post radiation therapy completed in 2017 with ongoing chemotherapy. Restaging examination. Intermittent chest pain and nocturnal cough. * Tracking Code: BO * EXAM: CT  CHEST, ABDOMEN AND PELVIS WITHOUT CONTRAST TECHNIQUE: Multidetector CT imaging of the chest, abdomen and pelvis was performed following the standard protocol without IV contrast. RADIATION DOSE REDUCTION: This exam was performed according to the departmental dose-optimization program which includes automated exposure control, adjustment of the mA and/or kV according to patient size and/or use of iterative reconstruction technique. COMPARISON:  05/28/2023 FINDINGS: CT CHEST FINDINGS Cardiovascular: Extensive coronary artery calcification. Calcification of the aortic valve leaflets. Global cardiac size within limits of there is notable enlargement of the right ventricle in keeping with elevated right heart pressure. Central pulmonary arteries are stably enlarged in keeping with changes of pulmonary arterial hypertension. Hypoattenuation of the cardiac blood pool in keeping with at least mild anemia. No pericardial effusion. Moderate atherosclerotic calcification within the thoracic aorta. No aortic aneurysm. Right internal jugular chest port tip seen at the superior cavoatrial junction. Mediastinum/Nodes: No enlarged mediastinal, hilar, or axillary lymph nodes. Thyroid  gland, trachea, and esophagus demonstrate no significant findings. Lungs/Pleura: Stable spiculated nodule within the right apex and an area of parenchymal scarring measuring 1.9 x 1.3 cm at axial image # 30/7. Stable bandlike scarring within the subpleural right lower lobe, axial image # 101/7. New mean 6 mm noncalcified pulmonary nodule, axial image # 117/7, right lower lobe. Right perihilar post radiation cicatricial changes are again identified and are stable. Severe emphysema with stable pulmonary hyperinflation. Interval development small right pleural effusion. No pneumothorax. No central obstructing lesion Musculoskeletal: No acute bone abnormality. No lytic or blastic bone lesion. Osseous structures are age appropriate. CT ABDOMEN PELVIS  FINDINGS Hepatobiliary: No focal liver abnormality is seen. Status post cholecystectomy. No intrahepatic biliary ductal dilation. Stable mild dilation of the extrahepatic duct measuring up to 13 mm likely representing post cholecystectomy change. Pancreas: Unremarkable Spleen: Unremarkable Adrenals/Urinary Tract: Adrenal glands are unremarkable. Simple cortical cysts are seen within the kidneys bilaterally for which no follow-up imaging is recommended. The kidneys are otherwise unremarkable. Bladder unremarkable. Stomach/Bowel: Severe descending and sigmoid diverticulosis. Stomach, small bowel, and large bowel are otherwise unremarkable. Appendix normal. No evidence of obstruction or focal inflammation. No free intraperitoneal gas or fluid. Vascular/Lymphatic: Aortic atherosclerosis. No enlarged abdominal or pelvic lymph nodes. Reproductive: Multiple calcified uterine fibroids are again identified. No adnexal masses are seen. Other: No  abdominal wall hernia or abnormality. No abdominopelvic ascites. Musculoskeletal: No acute bone abnormality. No lytic or blastic bone lesion. Osseous structures are age appropriate. IMPRESSION: 1. New 6 mm noncalcified pulmonary nodule within the right lower lobe. Non-contrast chest CT at 6-12 months is recommended. If the nodule is stable at time of repeat CT, then future CT at 18-24 months (from today's scan) is considered optional for low-risk patients, but is recommended for high-risk patients. This recommendation follows the consensus statement: Guidelines for Management of Incidental Pulmonary Nodules Detected on CT Images: From the Fleischner Society 2017; Radiology 2017; 284:228-243. 2. Stable right perihilar post radiation cicatricial changes. 3. Stable spiculated right apical pulmonary nodule in keeping with the patient's treated bronchogenic neoplasm. No evidence of recurrent or residual disease within the chest,, and pelvis. 4. Extensive coronary artery calcification. 5.  Enlarged central pulmonary arteries and right ventricle in keeping with changes of pulmonary arterial hypertension and elevated right heart pressure. 6. Hypoattenuation of the cardiac blood pool in keeping with at least mild anemia. 7. Severe emphysema.  New small right pleural effusion. 8. Severe descending and sigmoid diverticulosis. Aortic Atherosclerosis (ICD10-I70.0) and Emphysema (ICD10-J43.9). Electronically Signed   By: Worthy Heads M.D.   On: 08/10/2023 00:36   CT ABDOMEN PELVIS WO CONTRAST Result Date: 08/10/2023 CLINICAL DATA:  Non-small cell lung cancer status post radiation therapy completed in 2017 with ongoing chemotherapy. Restaging examination. Intermittent chest pain and nocturnal cough. * Tracking Code: BO * EXAM: CT CHEST, ABDOMEN AND PELVIS WITHOUT CONTRAST TECHNIQUE: Multidetector CT imaging of the chest, abdomen and pelvis was performed following the standard protocol without IV contrast. RADIATION DOSE REDUCTION: This exam was performed according to the departmental dose-optimization program which includes automated exposure control, adjustment of the mA and/or kV according to patient size and/or use of iterative reconstruction technique. COMPARISON:  05/28/2023 FINDINGS: CT CHEST FINDINGS Cardiovascular: Extensive coronary artery calcification. Calcification of the aortic valve leaflets. Global cardiac size within limits of there is notable enlargement of the right ventricle in keeping with elevated right heart pressure. Central pulmonary arteries are stably enlarged in keeping with changes of pulmonary arterial hypertension. Hypoattenuation of the cardiac blood pool in keeping with at least mild anemia. No pericardial effusion. Moderate atherosclerotic calcification within the thoracic aorta. No aortic aneurysm. Right internal jugular chest port tip seen at the superior cavoatrial junction. Mediastinum/Nodes: No enlarged mediastinal, hilar, or axillary lymph nodes. Thyroid  gland,  trachea, and esophagus demonstrate no significant findings. Lungs/Pleura: Stable spiculated nodule within the right apex and an area of parenchymal scarring measuring 1.9 x 1.3 cm at axial image # 30/7. Stable bandlike scarring within the subpleural right lower lobe, axial image # 101/7. New mean 6 mm noncalcified pulmonary nodule, axial image # 117/7, right lower lobe. Right perihilar post radiation cicatricial changes are again identified and are stable. Severe emphysema with stable pulmonary hyperinflation. Interval development small right pleural effusion. No pneumothorax. No central obstructing lesion Musculoskeletal: No acute bone abnormality. No lytic or blastic bone lesion. Osseous structures are age appropriate. CT ABDOMEN PELVIS FINDINGS Hepatobiliary: No focal liver abnormality is seen. Status post cholecystectomy. No intrahepatic biliary ductal dilation. Stable mild dilation of the extrahepatic duct measuring up to 13 mm likely representing post cholecystectomy change. Pancreas: Unremarkable Spleen: Unremarkable Adrenals/Urinary Tract: Adrenal glands are unremarkable. Simple cortical cysts are seen within the kidneys bilaterally for which no follow-up imaging is recommended. The kidneys are otherwise unremarkable. Bladder unremarkable. Stomach/Bowel: Severe descending and sigmoid diverticulosis. Stomach, small bowel, and large  bowel are otherwise unremarkable. Appendix normal. No evidence of obstruction or focal inflammation. No free intraperitoneal gas or fluid. Vascular/Lymphatic: Aortic atherosclerosis. No enlarged abdominal or pelvic lymph nodes. Reproductive: Multiple calcified uterine fibroids are again identified. No adnexal masses are seen. Other: No abdominal wall hernia or abnormality. No abdominopelvic ascites. Musculoskeletal: No acute bone abnormality. No lytic or blastic bone lesion. Osseous structures are age appropriate. IMPRESSION: 1. New 6 mm noncalcified pulmonary nodule within the  right lower lobe. Non-contrast chest CT at 6-12 months is recommended. If the nodule is stable at time of repeat CT, then future CT at 18-24 months (from today's scan) is considered optional for low-risk patients, but is recommended for high-risk patients. This recommendation follows the consensus statement: Guidelines for Management of Incidental Pulmonary Nodules Detected on CT Images: From the Fleischner Society 2017; Radiology 2017; 284:228-243. 2. Stable right perihilar post radiation cicatricial changes. 3. Stable spiculated right apical pulmonary nodule in keeping with the patient's treated bronchogenic neoplasm. No evidence of recurrent or residual disease within the chest,, and pelvis. 4. Extensive coronary artery calcification. 5. Enlarged central pulmonary arteries and right ventricle in keeping with changes of pulmonary arterial hypertension and elevated right heart pressure. 6. Hypoattenuation of the cardiac blood pool in keeping with at least mild anemia. 7. Severe emphysema.  New small right pleural effusion. 8. Severe descending and sigmoid diverticulosis. Aortic Atherosclerosis (ICD10-I70.0) and Emphysema (ICD10-J43.9). Electronically Signed   By: Worthy Heads M.D.   On: 08/10/2023 00:36   DG Chest Portable 1 View Result Date: 08/09/2023 EXAM: 1 VIEW(S) XRAY OF THE CHEST 08/09/2023 09:18:00 PM COMPARISON: CT chest 08/04/2023. CLINICAL HISTORY: Shortness of breath. Patient from home for shortness of breath, weakness, and left side facial droop. EMS reports patient woke up from a nap at 1900 and was normal per son; last known normal around 1930. Denies any recent falls or blood thinners. Patient wears 4L O2 via nasal cannula at baseline due to lung cancer. Upon arrival to ER, patient is alert and oriented, able to move all extremities; left eye closed with left sided facial spasms. ER MD at bedside on arrival, called stroke alert at 2028. FINDINGS: LUNGS AND PLEURA: Stable nodular opacity in the  right upper lobe, likely corresponding to the patient's known treated primary bronchogenic carcinoma, better evaluated on CT. New patchy/interstitial opacities in the mid and lower lungs bilaterally, suggesting mild infection/pneumonia. No pleural effusion. No pneumothorax. HEART AND MEDIASTINUM: No acute abnormality of the cardiac and mediastinal silhouettes. Thoracic aortic atherosclerosis. BONES AND SOFT TISSUES: No acute osseous abnormality. Right chest power port terminating at the cavoatrial junction. IMPRESSION: 1. New patchy/interstitial opacities in the mid and lower lungs bilaterally, suggesting mild infection/pneumonia. 2. Stable nodular opacity in the right upper lobe, likely corresponding to the patient's known treated primary bronchogenic carcinoma. Electronically signed by: Zadie Herter MD 08/09/2023 10:05 PM EDT RP Workstation: [id number]   CT HEAD CODE STROKE WO CONTRAST Result Date: 08/09/2023 CLINICAL DATA:  Code stroke. Initial evaluation for acute neuro deficit, stroke suspected. EXAM: CT HEAD WITHOUT CONTRAST TECHNIQUE: Contiguous axial images were obtained from the base of the skull through the vertex without intravenous contrast. RADIATION DOSE REDUCTION: This exam was performed according to the departmental dose-optimization program which includes automated exposure control, adjustment of the mA and/or kV according to patient size and/or use of iterative reconstruction technique. COMPARISON:  Prior study from 07/16/2022. FINDINGS: Brain: Cerebral volume within normal limits. Mild chronic microvascular ischemic disease for age. No acute intracranial hemorrhage.  No acute large vessel territory infarct. No mass lesion or midline shift. No hydrocephalus or extra-axial fluid collection. Vascular: No abnormal hyperdense vessel. Scattered vascular calcifications noted within the carotid siphons. Skull: Scalp soft tissues demonstrate no acute finding. Calvarium intact without fracture.  Diffuse osteopenia noted. Sinuses/Orbits: Globes orbital soft tissues within normal limits. Paranasal sinuses and mastoid air cells are largely clear. Other: None. ASPECTS [facility] Stroke Program Early CT Score) - Ganglionic level infarction (caudate, lentiform nuclei, internal capsule, insula, M1-M3 cortex): 7 - Supraganglionic infarction (M4-M6 cortex): 3 Total score (0-10 with 10 being normal): 10 IMPRESSION: 1. No acute intracranial abnormality. 2. ASPECTS is 10. 3. Mild chronic microvascular ischemic disease for age. Results were called by telephone at the time of interpretation on 08/09/2023 at 8:44 pm to provider JULIE HAVILAND , who verbally acknowledged these results. Electronically Signed   By: Virgia Griffins M.D.   On: 08/09/2023 20:44     ASSESSMENT/PLAN:  This is a very pleasant 73 year old African-American female with recurrent metastatic non-small cell lung cancer, adenocarcinoma of the right middle lobe.  She was initially diagnosed as stage IIIa in September 2017.     She completed concurrent chemoradiation followed by consolidation with carboplatin  and paclitaxel . She is status post 3 cycles. The patient was on observation but a restaging CT scan showed evidence of disease progression.   She then was undergoing treatment with second line with immunotherapy with nivolumab  480 mg IV every 4 weeks.  She was status post 31 cycles. Her treatment had been on hold since August 2021 until her COPD can get under control. She showed evidence of some recurrent disease in March 2022 and she resumed her treatment with cycle number cycle #33 of single agent nivolumab  480 mg IV every 4 weeks in March 2022 status post 55 cycles. This was placed on hold again in December 2023 due to She was on a break from treatment from December 2023 due to hospitalizations for COPD exacerbation GI bleed secondary to [facility] powder. Therefore, she has been on observation.   The patient was seen with Dr. Marguerita Shih today.   Dr. Marguerita Shih personally and independently reviewed the scan and discussed results with the patient today.  The scan showed ***.  Dr. Marguerita Shih recommends ***  Dr. Marguerita Shih recommends she continue on observation with a repeat CT scan in 4 months.   BP  Iron  supplements.   Flush appointments in [place] ***  The patient was advised to call immediately if she has any concerning symptoms in the interval. The patient voices understanding of current disease status and treatment options and is in agreement with the current care plan. All questions were answered. The patient knows to call the clinic with any problems, questions or concerns. We can certainly see the patient much sooner if necessary        No orders of the defined types were placed in this encounter.    I spent {CHL ONC TIME VISIT - ZOXWR:[id number]} counseling the patient face to face. The total time spent in the appointment was {CHL ONC TIME VISIT - BJYNW:[id number]}.  Salley Boxley L Skip Litke, PA-C 08/21/23

## 2023-08-24 ENCOUNTER — Encounter: Payer: Self-pay | Admitting: Physician Assistant

## 2023-08-24 ENCOUNTER — Encounter: Payer: Self-pay | Admitting: Internal Medicine

## 2023-08-26 ENCOUNTER — Telehealth: Payer: Self-pay

## 2023-08-26 ENCOUNTER — Inpatient Hospital Stay (HOSPITAL_BASED_OUTPATIENT_CLINIC_OR_DEPARTMENT_OTHER): Admitting: Physician Assistant

## 2023-08-26 ENCOUNTER — Inpatient Hospital Stay: Attending: Internal Medicine

## 2023-08-26 VITALS — BP 115/72 | HR 108 | Temp 97.2°F | Resp 16 | Wt 120.9 lb

## 2023-08-26 DIAGNOSIS — D509 Iron deficiency anemia, unspecified: Secondary | ICD-10-CM | POA: Insufficient documentation

## 2023-08-26 DIAGNOSIS — D500 Iron deficiency anemia secondary to blood loss (chronic): Secondary | ICD-10-CM

## 2023-08-26 DIAGNOSIS — J439 Emphysema, unspecified: Secondary | ICD-10-CM | POA: Insufficient documentation

## 2023-08-26 DIAGNOSIS — Z9221 Personal history of antineoplastic chemotherapy: Secondary | ICD-10-CM | POA: Insufficient documentation

## 2023-08-26 DIAGNOSIS — Z9981 Dependence on supplemental oxygen: Secondary | ICD-10-CM | POA: Insufficient documentation

## 2023-08-26 DIAGNOSIS — Z79899 Other long term (current) drug therapy: Secondary | ICD-10-CM | POA: Diagnosis not present

## 2023-08-26 DIAGNOSIS — Z923 Personal history of irradiation: Secondary | ICD-10-CM | POA: Insufficient documentation

## 2023-08-26 DIAGNOSIS — C3491 Malignant neoplasm of unspecified part of right bronchus or lung: Secondary | ICD-10-CM | POA: Diagnosis not present

## 2023-08-26 DIAGNOSIS — R911 Solitary pulmonary nodule: Secondary | ICD-10-CM | POA: Diagnosis not present

## 2023-08-26 DIAGNOSIS — Z87891 Personal history of nicotine dependence: Secondary | ICD-10-CM | POA: Diagnosis not present

## 2023-08-26 DIAGNOSIS — K5520 Angiodysplasia of colon without hemorrhage: Secondary | ICD-10-CM

## 2023-08-26 DIAGNOSIS — C3490 Malignant neoplasm of unspecified part of unspecified bronchus or lung: Secondary | ICD-10-CM

## 2023-08-26 DIAGNOSIS — C342 Malignant neoplasm of middle lobe, bronchus or lung: Secondary | ICD-10-CM | POA: Diagnosis present

## 2023-08-26 DIAGNOSIS — M8580 Other specified disorders of bone density and structure, unspecified site: Secondary | ICD-10-CM | POA: Insufficient documentation

## 2023-08-26 LAB — CBC WITH DIFFERENTIAL (CANCER CENTER ONLY)
Abs Immature Granulocytes: 0.03 10*3/uL (ref 0.00–0.07)
Basophils Absolute: 0 10*3/uL (ref 0.0–0.1)
Basophils Relative: 1 %
Eosinophils Absolute: 0.2 10*3/uL (ref 0.0–0.5)
Eosinophils Relative: 2 %
HCT: 27.2 % — ABNORMAL LOW (ref 36.0–46.0)
Hemoglobin: 8.6 g/dL — ABNORMAL LOW (ref 12.0–15.0)
Immature Granulocytes: 0 %
Lymphocytes Relative: 13 %
Lymphs Abs: 1 10*3/uL (ref 0.7–4.0)
MCH: 24.7 pg — ABNORMAL LOW (ref 26.0–34.0)
MCHC: 31.6 g/dL (ref 30.0–36.0)
MCV: 78.2 fL — ABNORMAL LOW (ref 80.0–100.0)
Monocytes Absolute: 0.3 10*3/uL (ref 0.1–1.0)
Monocytes Relative: 4 %
Neutro Abs: 6.5 10*3/uL (ref 1.7–7.7)
Neutrophils Relative %: 80 %
Platelet Count: 313 10*3/uL (ref 150–400)
RBC: 3.48 MIL/uL — ABNORMAL LOW (ref 3.87–5.11)
RDW: 21.7 % — ABNORMAL HIGH (ref 11.5–15.5)
WBC Count: 8.1 10*3/uL (ref 4.0–10.5)
nRBC: 0 % (ref 0.0–0.2)

## 2023-08-26 LAB — CMP (CANCER CENTER ONLY)
ALT: 5 U/L (ref 0–44)
AST: 13 U/L — ABNORMAL LOW (ref 15–41)
Albumin: 4.1 g/dL (ref 3.5–5.0)
Alkaline Phosphatase: 221 U/L — ABNORMAL HIGH (ref 38–126)
Anion gap: 5 (ref 5–15)
BUN: 14 mg/dL (ref 8–23)
CO2: 34 mmol/L — ABNORMAL HIGH (ref 22–32)
Calcium: 8.8 mg/dL — ABNORMAL LOW (ref 8.9–10.3)
Chloride: 107 mmol/L (ref 98–111)
Creatinine: 0.72 mg/dL (ref 0.44–1.00)
GFR, Estimated: >60 mL/min (ref >60)
Glucose, Bld: 81 mg/dL (ref 70–99)
Potassium: 4 mmol/L (ref 3.5–5.1)
Sodium: 146 mmol/L — ABNORMAL HIGH (ref 135–145)
Total Bilirubin: 0.1 mg/dL (ref 0.0–1.2)
Total Protein: 6.6 g/dL (ref 6.5–8.1)

## 2023-08-26 LAB — IRON AND IRON BINDING CAPACITY (CC-WL,HP ONLY)
Iron: 16 ug/dL — ABNORMAL LOW (ref 28–170)
Saturation Ratios: 4 % — ABNORMAL LOW (ref 10.4–31.8)
TIBC: 407 ug/dL (ref 250–450)
UIBC: 391 ug/dL (ref 148–442)

## 2023-08-26 LAB — SAMPLE TO BLOOD BANK

## 2023-08-26 LAB — ABO/RH: ABO/RH(D): O POS

## 2023-08-26 LAB — FERRITIN: Ferritin: 24 ng/mL (ref 11–307)

## 2023-08-26 MED ORDER — SODIUM CHLORIDE 0.9% FLUSH
10.0000 mL | Freq: Once | INTRAVENOUS | Status: AC | PRN
Start: 2023-08-26 — End: 2023-08-26
  Administered 2023-08-26: 10 mL

## 2023-08-26 MED ORDER — HEPARIN SOD (PORK) LOCK FLUSH 100 UNIT/ML IV SOLN
500.0000 [IU] | Freq: Once | INTRAVENOUS | Status: AC | PRN
Start: 2023-08-26 — End: 2023-08-26
  Administered 2023-08-26: 500 [IU]

## 2023-08-26 NOTE — Telephone Encounter (Signed)
 Courtney Zuniga, patient will be scheduled as soon as possible.  Auth Submission: NO AUTH NEEDED Site of care: Site of care: [facility] Payer: [employer] medicare dual complete Medication & CPT/J Code(s) submitted: Venofer  (Iron  Sucrose) J1756 Route of submission (phone, fax, portal):  Phone # Fax # Auth type: Buy/Bill PB Units/visits requested: 300mg  x 3 doses Reference number:  Approval from: 08/26/23 to 12/27/23

## 2023-08-26 NOTE — Telephone Encounter (Signed)
 Courtney Zuniga, does patient need OV again? We haven't received any clearance on patient

## 2023-09-09 ENCOUNTER — Ambulatory Visit (INDEPENDENT_AMBULATORY_CARE_PROVIDER_SITE_OTHER)

## 2023-09-09 VITALS — BP 111/73 | HR 107 | Temp 98.6°F | Resp 16 | Ht 64.0 in | Wt 119.8 lb

## 2023-09-09 DIAGNOSIS — D509 Iron deficiency anemia, unspecified: Secondary | ICD-10-CM

## 2023-09-09 MED ORDER — ALBUTEROL SULFATE HFA 108 (90 BASE) MCG/ACT IN AERS
2.0000 | INHALATION_SPRAY | Freq: Once | RESPIRATORY_TRACT | Status: DC | PRN
Start: 2023-09-09 — End: 2023-09-09

## 2023-09-09 MED ORDER — EPINEPHRINE 0.3 MG/0.3ML IJ SOAJ: 0.3000 mg | Freq: Once | INTRAMUSCULAR | Status: DC | PRN

## 2023-09-09 MED ORDER — SODIUM CHLORIDE 0.9 % IV SOLN
Freq: Once | INTRAVENOUS | Status: DC | PRN
Start: 2023-09-09 — End: 2023-09-09

## 2023-09-09 MED ORDER — HEPARIN SOD (PORK) LOCK FLUSH 100 UNIT/ML IV SOLN
500.0000 [IU] | Freq: Once | INTRAVENOUS | Status: AC | PRN
  Administered 2023-09-09: 500 [IU]
  Filled 2023-09-09: qty 5

## 2023-09-09 MED ORDER — ALTEPLASE 2 MG IJ SOLR
2.0000 mg | Freq: Once | INTRAMUSCULAR | Status: DC | PRN
Start: 2023-09-09 — End: 2023-09-09

## 2023-09-09 MED ORDER — FAMOTIDINE IN NACL 20-0.9 MG/50ML-% IV SOLN
20.0000 mg | Freq: Once | INTRAVENOUS | Status: DC | PRN
Start: 2023-09-09 — End: 2023-09-09

## 2023-09-09 MED ORDER — IRON SUCROSE 300 MG IVPB - SIMPLE MED: 300.0000 mg | Freq: Once | Status: DC

## 2023-09-09 MED ORDER — SODIUM CHLORIDE 0.9% FLUSH
3.0000 mL | Freq: Once | INTRAVENOUS | Status: DC | PRN
Start: 2023-09-09 — End: 2023-09-09

## 2023-09-09 MED ORDER — ANTICOAGULANT SODIUM CITRATE 4% (200MG/5ML) IV SOLN: 5.0000 mL | Freq: Once | Status: DC | PRN

## 2023-09-09 MED ORDER — HEPARIN SOD (PORK) LOCK FLUSH 100 UNIT/ML IV SOLN
250.0000 [IU] | Freq: Once | INTRAVENOUS | Status: DC | PRN
Start: 2023-09-09 — End: 2023-09-09

## 2023-09-09 MED ORDER — DIPHENHYDRAMINE HCL 50 MG/ML IJ SOLN
50.0000 mg | Freq: Once | INTRAMUSCULAR | Status: DC | PRN
Start: 2023-09-09 — End: 2023-09-09

## 2023-09-09 MED ORDER — SODIUM CHLORIDE 0.9 % IV SOLN
300.0000 mg | Freq: Once | INTRAVENOUS | Status: AC
  Administered 2023-09-09: 300 mg via INTRAVENOUS
  Filled 2023-09-09: qty 15

## 2023-09-09 MED ORDER — SODIUM CHLORIDE 0.9% FLUSH
10.0000 mL | Freq: Once | INTRAVENOUS | Status: DC | PRN
Start: 2023-09-09 — End: 2023-09-09

## 2023-09-09 MED ORDER — METHYLPREDNISOLONE SODIUM SUCC 125 MG IJ SOLR
125.0000 mg | Freq: Once | INTRAMUSCULAR | Status: DC | PRN
Start: 2023-09-09 — End: 2023-09-09

## 2023-09-09 NOTE — Progress Notes (Signed)
 Diagnosis: Acute Anemia  Provider:  Phyllis Breeze MD  Procedure: IV Infusion  IV Type: Port a Cath, IV Location: R Chest  Venofer  (Iron  Sucrose), Dose: 300 mg  Infusion Start Time: 1410  Infusion Stop Time: 1555  Post Infusion IV Care: Observation period completed and Port a Cath Deaccessed/Flushed  Discharge: Condition: Good, Destination: Home . AVS Declined  Performed by:  Shirly Dow, RN

## 2023-09-09 NOTE — Telephone Encounter (Signed)
 Yes, needs office visit first. Thanks!

## 2023-09-16 ENCOUNTER — Ambulatory Visit

## 2023-09-17 ENCOUNTER — Inpatient Hospital Stay (HOSPITAL_COMMUNITY)
Admission: EM | Admit: 2023-09-17 | Discharge: 2023-09-22 | DRG: 189 | Disposition: A | Discharge status: Home Health | Attending: Family Medicine | Admitting: Family Medicine

## 2023-09-17 ENCOUNTER — Emergency Department (HOSPITAL_COMMUNITY)

## 2023-09-17 ENCOUNTER — Encounter (HOSPITAL_COMMUNITY): Payer: Self-pay | Admitting: Emergency Medicine

## 2023-09-17 ENCOUNTER — Other Ambulatory Visit: Payer: Self-pay

## 2023-09-17 ENCOUNTER — Encounter: Payer: Self-pay | Admitting: Gastroenterology

## 2023-09-17 DIAGNOSIS — I5032 Chronic diastolic (congestive) heart failure: Secondary | ICD-10-CM | POA: Diagnosis present

## 2023-09-17 DIAGNOSIS — J441 Chronic obstructive pulmonary disease with (acute) exacerbation: Principal | ICD-10-CM | POA: Diagnosis present

## 2023-09-17 DIAGNOSIS — Z801 Family history of malignant neoplasm of trachea, bronchus and lung: Secondary | ICD-10-CM

## 2023-09-17 DIAGNOSIS — I5030 Unspecified diastolic (congestive) heart failure: Secondary | ICD-10-CM | POA: Diagnosis present

## 2023-09-17 DIAGNOSIS — C3491 Malignant neoplasm of unspecified part of right bronchus or lung: Secondary | ICD-10-CM | POA: Diagnosis present

## 2023-09-17 DIAGNOSIS — Z66 Do not resuscitate: Secondary | ICD-10-CM | POA: Diagnosis present

## 2023-09-17 DIAGNOSIS — Z87891 Personal history of nicotine dependence: Secondary | ICD-10-CM

## 2023-09-17 DIAGNOSIS — I352 Nonrheumatic aortic (valve) stenosis with insufficiency: Secondary | ICD-10-CM | POA: Diagnosis present

## 2023-09-17 DIAGNOSIS — R627 Adult failure to thrive: Secondary | ICD-10-CM | POA: Diagnosis present

## 2023-09-17 DIAGNOSIS — Z888 Allergy status to other drugs, medicaments and biological substances status: Secondary | ICD-10-CM | POA: Diagnosis not present

## 2023-09-17 DIAGNOSIS — Z808 Family history of malignant neoplasm of other organs or systems: Secondary | ICD-10-CM | POA: Diagnosis not present

## 2023-09-17 DIAGNOSIS — I252 Old myocardial infarction: Secondary | ICD-10-CM | POA: Diagnosis not present

## 2023-09-17 DIAGNOSIS — Z681 Body mass index (BMI) 19 or less, adult: Secondary | ICD-10-CM

## 2023-09-17 DIAGNOSIS — J9611 Chronic respiratory failure with hypoxia: Secondary | ICD-10-CM | POA: Diagnosis present

## 2023-09-17 DIAGNOSIS — K219 Gastro-esophageal reflux disease without esophagitis: Secondary | ICD-10-CM | POA: Diagnosis present

## 2023-09-17 DIAGNOSIS — I350 Nonrheumatic aortic (valve) stenosis: Secondary | ICD-10-CM

## 2023-09-17 DIAGNOSIS — Z825 Family history of asthma and other chronic lower respiratory diseases: Secondary | ICD-10-CM

## 2023-09-17 DIAGNOSIS — R64 Cachexia: Secondary | ICD-10-CM | POA: Diagnosis present

## 2023-09-17 DIAGNOSIS — I10 Essential (primary) hypertension: Secondary | ICD-10-CM | POA: Diagnosis present

## 2023-09-17 DIAGNOSIS — J9602 Acute respiratory failure with hypercapnia: Secondary | ICD-10-CM | POA: Diagnosis not present

## 2023-09-17 DIAGNOSIS — F32A Depression, unspecified: Secondary | ICD-10-CM | POA: Diagnosis present

## 2023-09-17 DIAGNOSIS — I110 Hypertensive heart disease with heart failure: Secondary | ICD-10-CM | POA: Diagnosis present

## 2023-09-17 DIAGNOSIS — J9601 Acute respiratory failure with hypoxia: Secondary | ICD-10-CM | POA: Diagnosis not present

## 2023-09-17 DIAGNOSIS — Z8041 Family history of malignant neoplasm of ovary: Secondary | ICD-10-CM | POA: Diagnosis not present

## 2023-09-17 DIAGNOSIS — I2510 Atherosclerotic heart disease of native coronary artery without angina pectoris: Secondary | ICD-10-CM | POA: Diagnosis present

## 2023-09-17 DIAGNOSIS — R0689 Other abnormalities of breathing: Secondary | ICD-10-CM | POA: Diagnosis present

## 2023-09-17 DIAGNOSIS — I351 Nonrheumatic aortic (valve) insufficiency: Secondary | ICD-10-CM | POA: Diagnosis present

## 2023-09-17 DIAGNOSIS — Z7189 Other specified counseling: Secondary | ICD-10-CM | POA: Diagnosis not present

## 2023-09-17 DIAGNOSIS — Z9981 Dependence on supplemental oxygen: Secondary | ICD-10-CM

## 2023-09-17 DIAGNOSIS — Z8042 Family history of malignant neoplasm of prostate: Secondary | ICD-10-CM

## 2023-09-17 DIAGNOSIS — J9621 Acute and chronic respiratory failure with hypoxia: Secondary | ICD-10-CM | POA: Diagnosis present

## 2023-09-17 DIAGNOSIS — J9622 Acute and chronic respiratory failure with hypercapnia: Principal | ICD-10-CM | POA: Diagnosis present

## 2023-09-17 DIAGNOSIS — D509 Iron deficiency anemia, unspecified: Secondary | ICD-10-CM | POA: Diagnosis present

## 2023-09-17 DIAGNOSIS — Z79899 Other long term (current) drug therapy: Secondary | ICD-10-CM

## 2023-09-17 DIAGNOSIS — Z515 Encounter for palliative care: Secondary | ICD-10-CM | POA: Diagnosis not present

## 2023-09-17 LAB — BASIC METABOLIC PANEL WITH GFR
Anion gap: 11 (ref 5–15)
BUN: 10 mg/dL (ref 8–23)
CO2: 34 mmol/L — ABNORMAL HIGH (ref 22–32)
Calcium: 8.8 mg/dL — ABNORMAL LOW (ref 8.9–10.3)
Chloride: 96 mmol/L — ABNORMAL LOW (ref 98–111)
Creatinine, Ser: 0.35 mg/dL — ABNORMAL LOW (ref 0.44–1.00)
GFR, Estimated: >60 mL/min (ref >60)
Glucose, Bld: 118 mg/dL — ABNORMAL HIGH (ref 70–99)
Potassium: 4.1 mmol/L (ref 3.5–5.1)
Sodium: 141 mmol/L (ref 135–145)

## 2023-09-17 LAB — BLOOD GAS, VENOUS
Acid-Base Excess: 12 mmol/L — ABNORMAL HIGH (ref 0.0–2.0)
Bicarbonate: 40.7 mmol/L — ABNORMAL HIGH (ref 20.0–28.0)
Drawn by: 61373
O2 Saturation: 49 %
Patient temperature: 36.9
pCO2, Ven: 72 mmHg (ref 44–60)
pH, Ven: 7.36 (ref 7.25–7.43)
pO2, Ven: 31 mmHg — CL (ref 32–45)

## 2023-09-17 LAB — CBC
HCT: 27.2 % — ABNORMAL LOW (ref 36.0–46.0)
Hemoglobin: 8.4 g/dL — ABNORMAL LOW (ref 12.0–15.0)
MCH: 25.1 pg — ABNORMAL LOW (ref 26.0–34.0)
MCHC: 30.9 g/dL (ref 30.0–36.0)
MCV: 81.4 fL (ref 80.0–100.0)
Platelets: 510 10*3/uL — ABNORMAL HIGH (ref 150–400)
RBC: 3.34 MIL/uL — ABNORMAL LOW (ref 3.87–5.11)
RDW: 21.3 % — ABNORMAL HIGH (ref 11.5–15.5)
WBC: 6.8 10*3/uL (ref 4.0–10.5)
nRBC: 0.3 % — ABNORMAL HIGH (ref 0.0–0.2)

## 2023-09-17 MED ORDER — SODIUM CHLORIDE 0.9 % IV SOLN
500.0000 mg | INTRAVENOUS | Status: DC
  Administered 2023-09-17: 500 mg via INTRAVENOUS
  Filled 2023-09-17: qty 5

## 2023-09-17 MED ORDER — METHYLPREDNISOLONE SODIUM SUCC 125 MG IJ SOLR
125.0000 mg | Freq: Once | INTRAMUSCULAR | Status: AC
  Administered 2023-09-17: 125 mg via INTRAVENOUS
  Filled 2023-09-17: qty 2

## 2023-09-17 MED ORDER — IPRATROPIUM-ALBUTEROL 0.5-2.5 (3) MG/3ML IN SOLN
3.0000 mL | Freq: Once | RESPIRATORY_TRACT | Status: AC
  Administered 2023-09-17: 3 mL via RESPIRATORY_TRACT
  Filled 2023-09-17: qty 3

## 2023-09-17 MED ORDER — SODIUM CHLORIDE 0.9 % IV SOLN
2.0000 g | Freq: Once | INTRAVENOUS | Status: AC
  Administered 2023-09-17: 2 g via INTRAVENOUS
  Filled 2023-09-17: qty 20

## 2023-09-17 NOTE — H&P (Signed)
 History and Physical    PatientDiedra Zuniga ZOX:[id number] DOB: 18-Jul-1950 DOA: 09/17/2023 DOS: the patient was seen and examined on 09/17/2023 PCP: Caresse Chant, FNP  Patient coming from: {Point_of_Origin:[address]}  Chief Complaint:  Chief Complaint  Patient presents with   Shortness of Breath   HPI: Courtney Zuniga is a 73 y.o. female with medical history significant of ***  Review of Systems: {ROS_Text:26778} Past Medical History:  Diagnosis Date   Anemia    as a young woman   Arthritis    osteoartritis   Asthma    Brain tumor (benign) (HCC) 2005 [facility]   Benign   Chronic headaches    Chronic hip pain    Chronic pain    COPD (chronic obstructive pulmonary disease) (HCC)    Coronary artery disease    Depression    Depression 05/15/2016   Encounter for antineoplastic chemotherapy 01/10/2016   GERD (gastroesophageal reflux disease)    Hypertension    Lung cancer (HCC) dx'd 01/2016   currently on chemo and radiation    NSTEMI (non-ST elevated myocardial infarction) (HCC) yrs ago   On home O2    qhs 2 liters at hs and prn   Pneumonia last time 2 yrs ago   Shortness of breath dyspnea    with activity   Past Surgical History:  Procedure Laterality Date   CHOLECYSTECTOMY     COLONOSCOPY  2015   Results requested from [facility]   COLONOSCOPY     ESOPHAGOGASTRODUODENOSCOPY N/A 08/14/2015   Procedure: ESOPHAGOGASTRODUODENOSCOPY (EGD);  Surgeon: Suzette Espy, MD;  Location: [facility];  Service: Endoscopy;  Laterality: N/A;  215    ESOPHAGOGASTRODUODENOSCOPY (EGD) WITH PROPOFOL  N/A 09/13/2015   Procedure: ESOPHAGOGASTRODUODENOSCOPY (EGD) WITH PROPOFOL ;  Surgeon: Janel Medford, MD;  Location: [facility];  Service: Endoscopy;  Laterality: N/A;   ESOPHAGOGASTRODUODENOSCOPY (EGD) WITH PROPOFOL  N/A 05/19/2022   Procedure: ESOPHAGOGASTRODUODENOSCOPY (EGD) WITH PROPOFOL ;  Surgeon: Vinetta Greening, DO;  [facility]: [facility];  Service: Endoscopy;  Laterality:  N/A;   EUS N/A 03/12/2017   Procedure: UPPER ENDOSCOPIC ULTRASOUND (EUS) RADIAL;  Surgeon: Janel Medford, MD;  Location: [facility];  Service: Endoscopy;  Laterality: N/A;   HOT HEMOSTASIS  05/19/2022   Procedure: HOT HEMOSTASIS (ARGON PLASMA COAGULATION/BICAP);  Surgeon: Vinetta Greening, DO;  Location: [facility];  Service: Endoscopy;;   IR IMAGING GUIDED PORT INSERTION  01/20/2022   TUMOR REMOVAL  2005   Benign   UPPER ESOPHAGEAL ENDOSCOPIC ULTRASOUND (EUS)  09/13/2015   Procedure: UPPER ESOPHAGEAL ENDOSCOPIC ULTRASOUND (EUS);  Surgeon: Janel Medford, MD;  Location: [facility];  Service: Endoscopy;;   VIDEO BRONCHOSCOPY WITH ENDOBRONCHIAL NAVIGATION N/A 12/31/2015   Procedure: VIDEO BRONCHOSCOPY WITH ENDOBRONCHIAL NAVIGATION;  Surgeon: Zelphia Higashi, MD;  [facility];  Service: Thoracic;  Laterality: N/A;   VIDEO BRONCHOSCOPY WITH ENDOBRONCHIAL ULTRASOUND N/A 11/08/2015   Procedure: VIDEO BRONCHOSCOPY WITH ENDOBRONCHIAL ULTRASOUND;  Surgeon: Heriberto London, MD;  Location: [facility] OR;  Service: Thoracic;  Laterality: N/A;   Social History:  reports that she quit smoking about 5 years ago. Her smoking use included cigarettes. She started smoking about 43 years ago. She has a 38 pack-year smoking history. She has never used smokeless tobacco. She reports that she does not drink alcohol and does not use drugs.  Allergies  Allergen Reactions   Desyrel  [Trazodone ] Other (See Comments)    "Made me faint"    Family History  Problem Relation Age of  Onset   Ovarian cancer Mother    Lung cancer Father    Brain cancer Sister        Half sister   Lung cancer Brother    Lung cancer Brother    Prostate cancer Brother    Colon cancer Neg Hx    Colon polyps Neg Hx     Prior to Admission medications   Medication Sig Start Date End Date Taking? Authorizing Provider  albuterol  (PROVENTIL ) (2.5 MG/3ML) 0.083% nebulizer solution Take 3 mLs (2.5 mg total) by nebulization 3 (three)  times daily as needed for shortness of breath or wheezing. 08/28/22   Colin Dawley, MD  albuterol  (VENTOLIN  HFA) 108 (90 Base) MCG/ACT inhaler Inhale 2 puffs into the lungs every 4 (four) hours as needed for shortness of breath. 08/28/22   Colin Dawley, MD  Aspirin -Salicylamide-Caffeine  (BC HEADACHE POWDER PO) Take 1 packet by mouth as needed (headache).    [provider]  benzonatate (TESSALON) 100 MG capsule Take 100 mg by mouth 3 (three) times daily as needed. 05/22/23   [provider]  bisoprolol  (ZEBETA ) 5 MG tablet Take 0.5 tablets (2.5 mg total) by mouth daily. Patient taking differently: Take 2.5 mg by mouth daily as needed (high bp). 10/13/22   Mallipeddi, Vishnu P, MD  docusate sodium  (COLACE) 100 MG capsule Take 1 capsule (100 mg total) by mouth 2 (two) times daily. 08/12/23 08/11/24  Mason Sole, Pratik D, DO  Docusate Sodium  (DSS) 100 MG CAPS Take 1 capsule by mouth daily as needed (constipation). 10/14/22   [provider]  DULoxetine  (CYMBALTA ) 60 MG capsule Take 1 capsule (60 mg total) by mouth 2 (two) times daily. Patient not taking: Reported on 08/11/2023 08/28/22   Colin Dawley, MD  ENSURE (ENSURE) Take 237 mLs by mouth 3 (three) times daily between meals.    [provider]  ferrous sulfate  325 (65 FE) MG tablet Take 1 tablet (325 mg total) by mouth daily with breakfast. 08/13/23 09/12/23  Mason Sole, Pratik D, DO  Fluticasone -Umeclidin-Vilant (TRELEGY ELLIPTA ) 100-62.5-25 MCG/ACT AEPB INHALE 1 PUFF BY MOUTH EVERY DAY    [provider]  Ipratropium-Albuterol  (COMBIVENT  RESPIMAT) 20-100 MCG/ACT AERS respimat Inhale 1 puff into the lungs every 6 (six) hours as needed for wheezing or shortness of breath. 08/28/22   Colin Dawley, MD  oxyCODONE -acetaminophen  (PERCOCET/ROXICET) 5-325 MG tablet Take 1 tablet by mouth every 8 (eight) hours as needed for moderate pain. 05/08/18   Colin Dawley, MD  pantoprazole  (PROTONIX ) 40 MG tablet Take 1 tablet (40  mg total) by mouth 2 (two) times daily. 08/28/22 08/28/23  Colin Dawley, MD  tamsulosin  (FLOMAX ) 0.4 MG CAPS capsule Take 1 capsule (0.4 mg total) by mouth daily after supper. 08/28/22   Colin Dawley, MD  traZODone  (DESYREL ) 50 MG tablet Take 1 tablet (50 mg total) by mouth at bedtime. 08/28/22   Colin Dawley, MD  Vitamin D , Ergocalciferol , (DRISDOL ) 1.25 MG (50000 UNIT) CAPS capsule Take 1 capsule (50,000 Units total) by mouth once a week. 08/28/22   Colin Dawley, MD  HYDROcodone  bit-homatropine (HYCODAN) 5-1.5 MG/5ML syrup Take 5 mLs by mouth every 6 (six) hours as needed for cough. 11/10/21   Marlene Simas, MD    Physical Exam: Vitals:   09/17/23 2150 09/17/23 2215 09/17/23 2245 09/17/23 2303  BP: 130/82 118/75 (!) 143/84   Pulse:  (!) 111 (!) 108   Resp:  (!) 21 (!) 41   Temp:      TempSrc:  SpO2:  91% 95% 93%  Weight:      Height:       *** Data Reviewed: {Tip this will not be part of the note when signed- Document your independent interpretation of telemetry tracing, EKG, lab, Radiology test or any other diagnostic tests. Add any new diagnostic test ordered today. (Optional):26781} {Results:26384}  Assessment and Plan: No notes have been filed under this hospital service. Service: Hospitalist     Advance Care Planning:   Code Status: Full Code ***  Consults: ***  Family Communication: ***  Severity of Illness: {Observation/Inpatient:21159}  Author: Albertus Alt, MD 09/17/2023 11:13 PM  For on call review [email or web address].

## 2023-09-17 NOTE — ED Triage Notes (Signed)
 Pt from home to ed [facility] c/o Sob for past week. Wears  4L O2 [place] chronic at home. Pt has a non-productive cough.  Hx copd; lung cx.   EMS gave Neb tx en route to which pt responded well with.

## 2023-09-17 NOTE — ED Provider Notes (Signed)
 [place] EM[facility]GENC[facility] DEPARTMENT AT [facility] Provider Note  CSN: [id number] Arrival date & time: 09/17/23 2059  Chief Complaint(s) Shortness of Breath  HPI Courtney Zuniga is a 73 y.o. female history of COPD, coronary artery disease, on chronic oxygen  presenting to the emergency department with shortness of breath.  Patient reports shortness of breath for the past week has been worsening.  Trying home breathing treatments without improvement.  Reports that she has not had any fevers but having some nonproductive cough.  No chest pain.  No back pain.  No abdominal pain.  No leg swelling.  Feels like prior COPD exacerbation.   Past Medical History Past Medical History:  Diagnosis Date   Anemia    as a young woman   Arthritis    osteoartritis   Asthma    Brain tumor (benign) (HCC) 2005 [facility]   Benign   Chronic headaches    Chronic hip pain    Chronic pain    COPD (chronic obstructive pulmonary disease) (HCC)    Coronary artery disease    Depression    Depression 05/15/2016   Encounter for antineoplastic chemotherapy 01/10/2016   GERD (gastroesophageal reflux disease)    Hypertension    Lung cancer (HCC) dx'd 01/2016   currently on chemo and radiation    NSTEMI (non-ST elevated myocardial infarction) (HCC) yrs ago   On home O2    qhs 2 liters at hs and prn   Pneumonia last time 2 yrs ago   Shortness of breath dyspnea    with activity   Patient Active Problem List   Diagnosis Date Noted   Chronic anemia 08/11/2023   Acute metabolic encephalopathy 08/10/2023   COPD GOLD 2 criteria 2017 08/07/2023   Chronic respiratory failure with hypoxia and hypercapnia (HCC) 08/07/2023   Lobar pneumonia (HCC) 06/03/2023   Acute on chronic hypoxic respiratory failure (HCC) 05/28/2023   Sinus tachycardia 10/13/2022   (HFpEF) heart failure with preserved ejection fraction (HCC) 10/13/2022   Aortic regurgitation 10/13/2022   Aortic stenosis 10/13/2022   Nicotine  abuse 10/13/2022    DOE (dyspnea on exertion) 10/06/2022   Acute on chronic respiratory failure with hypoxia (HCC) 08/25/2022   Pleural effusion 08/25/2022   Hypotension 06/12/2022   Iron  deficiency anemia 06/12/2022   Acute on chronic anemia 05/20/2022   History of Gastric ulcer 05/20/2022   AVM (arteriovenous malformation) of small bowel, 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   Upper GI bleed 05/18/2022   Acute blood loss anemia 05/18/2022   Acute respiratory failure with hypoxia (HCC) 04/23/2022   Frequent PVCs 04/09/2022   Port-A-Cath in place 03/03/2022   Increased frequency of headaches 10/18/2020   COPD exacerbation (HCC) 11/05/2019   Chronic nonintractable headache    Acute on chronic respiratory failure with hypoxia and hypercapnia (HCC) 06/16/2018   Influenza A 06/16/2018   Severe protein-calorie malnutrition (HCC) 06/16/2018   GERD (gastroesophageal reflux disease) 06/16/2018   Bilateral pneumonia 05/05/2018   Sepsis due to CAP 05/05/2018   Chronic respiratory failure with hypoxia (HCC) 05/05/2018   Encounter for antineoplastic immunotherapy 04/23/2017   Depression 05/15/2016   Adenocarcinoma of right lung, stage 4 01/10/2016   Encounter for antineoplastic chemotherapy 01/10/2016   Lung mass 01/03/2016   Arthritis    Subepithelial gastric mass    Mucosal abnormality of stomach    Abdominal pain 08/08/2015   Abnormal CT of the abdomen 08/08/2015   Hypoxia 06/26/2014   COPD with acute exacerbation (HCC) 06/26/2014   Community acquired bilateral lower lobe pneumonia  Chest pain of uncertain etiology    Asthma, chronic    Chest pain 02/16/2014   HTN (hypertension) 02/16/2014   DDD (degenerative disc disease), lumbar 02/04/2011   Lumbar herniated disc 01/09/2011   Home Medication(s) Prior to Admission medications   Medication Sig Start Date End Date Taking? Authorizing Provider  albuterol  (PROVENTIL ) (2.5 MG/3ML) 0.083% nebulizer solution Take 3 mLs (2.5 mg total) by nebulization 3 (three) times  daily as needed for shortness of breath or wheezing. 08/28/22   Colin Dawley, MD  albuterol  (VENTOLIN  HFA) 108 (90 Base) MCG/ACT inhaler Inhale 2 puffs into the lungs every 4 (four) hours as needed for shortness of breath. 08/28/22   Colin Dawley, MD  Aspirin -Salicylamide-Caffeine  (BC HEADACHE POWDER PO) Take 1 packet by mouth as needed (headache).    [provider]  benzonatate (TESSALON) 100 MG capsule Take 100 mg by mouth 3 (three) times daily as needed. 05/22/23   [provider]  bisoprolol  (ZEBETA ) 5 MG tablet Take 0.5 tablets (2.5 mg total) by mouth daily. Patient taking differently: Take 2.5 mg by mouth daily as needed (high bp). 10/13/22   Mallipeddi, Vishnu P, MD  docusate sodium  (COLACE) 100 MG capsule Take 1 capsule (100 mg total) by mouth 2 (two) times daily. 08/12/23 08/11/24  Mason Sole, Pratik D, DO  Docusate Sodium  (DSS) 100 MG CAPS Take 1 capsule by mouth daily as needed (constipation). 10/14/22   [provider]  DULoxetine  (CYMBALTA ) 60 MG capsule Take 1 capsule (60 mg total) by mouth 2 (two) times daily. Patient not taking: Reported on 08/11/2023 08/28/22   Colin Dawley, MD  ENSURE (ENSURE) Take 237 mLs by mouth 3 (three) times daily between meals.    [provider]  ferrous sulfate  325 (65 FE) MG tablet Take 1 tablet (325 mg total) by mouth daily with breakfast. 08/13/23 09/12/23  Mason Sole, Pratik D, DO  Fluticasone -Umeclidin-Vilant (TRELEGY ELLIPTA ) 100-62.5-25 MCG/ACT AEPB INHALE 1 PUFF BY MOUTH EVERY DAY    [provider]  Ipratropium-Albuterol  (COMBIVENT  RESPIMAT) 20-100 MCG/ACT AERS respimat Inhale 1 puff into the lungs every 6 (six) hours as needed for wheezing or shortness of breath. 08/28/22   Colin Dawley, MD  oxyCODONE -acetaminophen  (PERCOCET/ROXICET) 5-325 MG tablet Take 1 tablet by mouth every 8 (eight) hours as needed for moderate pain. 05/08/18   Colin Dawley, MD  pantoprazole  (PROTONIX ) 40 MG tablet Take 1 tablet (40 mg  total) by mouth 2 (two) times daily. 08/28/22 08/28/23  Colin Dawley, MD  tamsulosin  (FLOMAX ) 0.4 MG CAPS capsule Take 1 capsule (0.4 mg total) by mouth daily after supper. 08/28/22   Colin Dawley, MD  traZODone  (DESYREL ) 50 MG tablet Take 1 tablet (50 mg total) by mouth at bedtime. 08/28/22   Colin Dawley, MD  Vitamin D , Ergocalciferol , (DRISDOL ) 1.25 MG (50000 UNIT) CAPS capsule Take 1 capsule (50,000 Units total) by mouth once a week. 08/28/22   Colin Dawley, MD  HYDROcodone  bit-homatropine (HYCODAN) 5-1.5 MG/5ML syrup Take 5 mLs by mouth every 6 (six) hours as needed for cough. 11/10/21   Marlene Simas, MD  Past Surgical History Past Surgical History:  Procedure Laterality Date   CHOLECYSTECTOMY     COLONOSCOPY  2015   Results requested from [facility]   COLONOSCOPY     ESOPHAGOGASTRODUODENOSCOPY N/A 08/14/2015   Procedure: ESOPHAGOGASTRODUODENOSCOPY (EGD);  Surgeon: Suzette Espy, MD;  Location: [facility];  Service: Endoscopy;  Laterality: N/A;  215    ESOPHAGOGASTRODUODENOSCOPY (EGD) WITH PROPOFOL  N/A 09/13/2015   Procedure: ESOPHAGOGASTRODUODENOSCOPY (EGD) WITH PROPOFOL ;  Surgeon: Janel Medford, MD;  Location: [facility] ENDOSCOPY;  Service: Endoscopy;  Laterality: N/A;   ESOPHAGOGASTRODUODENOSCOPY (EGD) WITH PROPOFOL  N/A 05/19/2022   Procedure: ESOPHAGOGASTRODUODENOSCOPY (EGD) WITH PROPOFOL ;  Surgeon: Vinetta Greening, DO;  Location: [facility];  Service: Endoscopy;  Laterality: N/A;   EUS N/A 03/12/2017   Procedure: UPPER ENDOSCOPIC ULTRASOUND (EUS) RADIAL;  Surgeon: Janel Medford, MD;  Location: [facility] ENDOSCOPY;  Service: Endoscopy;  Laterality: N/A;   HOT HEMOSTASIS  05/19/2022   Procedure: HOT HEMOSTASIS (ARGON PLASMA COAGULATION/BICAP);  Surgeon: Vinetta Greening, DO;  Location: [facility];  Service: Endoscopy;;   IR IMAGING  GUIDED PORT INSERTION  01/20/2022   TUMOR REMOVAL  2005   Benign   UPPER ESOPHAGEAL ENDOSCOPIC ULTRASOUND (EUS)  09/13/2015   Procedure: UPPER ESOPHAGEAL ENDOSCOPIC ULTRASOUND (EUS);  Surgeon: Janel Medford, MD;  Location: [facility];  Service: Endoscopy;;   VIDEO BRONCHOSCOPY WITH ENDOBRONCHIAL NAVIGATION N/A 12/31/2015   Procedure: VIDEO BRONCHOSCOPY WITH ENDOBRONCHIAL NAVIGATION;  Surgeon: Zelphia Higashi, MD;  Location: [facility] OR;  Service: Thoracic;  Laterality: N/A;   VIDEO BRONCHOSCOPY WITH ENDOBRONCHIAL ULTRASOUND N/A 11/08/2015   Procedure: VIDEO BRONCHOSCOPY WITH ENDOBRONCHIAL ULTRASOUND;  Surgeon: Heriberto London, MD;  Location: [facility] OR;  Service: Thoracic;  Laterality: N/A;   Family History Family History  Problem Relation Age of Onset   Ovarian cancer Mother    Lung cancer Father    Brain cancer Sister        Half sister   Lung cancer Brother    Lung cancer Brother    Prostate cancer Brother    Colon cancer Neg Hx    Colon polyps Neg Hx     Social History Social History   Tobacco Use   Smoking status: Former    Current packs/day: 0.00    Average packs/day: 1 pack/day for 38.0 years (38.0 ttl pk-yrs)    Types: Cigarettes    Start date: 07/25/1980    Quit date: 07/26/2018    Years since quitting: 5.1   Smokeless tobacco: Never   Tobacco comments:    Pt has asked doctor for med to help   Vaping Use   Vaping status: Never Used  Substance Use Topics   Alcohol use: No    Alcohol/week: 0.0 standard drinks of alcohol   Drug use: No   Allergies Desyrel  [trazodone ]  Review of Systems Review of Systems  All other systems reviewed and are negative.   Physical Exam Vital Signs  I have reviewed the triage vital signs BP (!) 146/76   Pulse (!) 113   Temp 98.5 F (36.9 C) (Oral)   Resp 20   Ht 5\' 4"  (1.626 m)   Wt 54.3 kg   SpO2 93%   BMI 20.55 kg/m  Physical Exam Vitals and nursing note reviewed.  Constitutional:      General: She is in acute distress.      Appearance: She is well-developed.  HENT:     Head: Normocephalic and atraumatic.     Mouth/Throat:  Mouth: Mucous membranes are moist.  Eyes:     Pupils: Pupils are equal, round, and reactive to light.  Cardiovascular:     Rate and Rhythm: Normal rate and regular rhythm.     Heart sounds: No murmur heard. Pulmonary:     Comments: Mild to moderate increased work of breathing, tachypnea, diffuse wheezing, accessory muscle use Abdominal:     General: Abdomen is flat.     Palpations: Abdomen is soft.     Tenderness: There is no abdominal tenderness.  Musculoskeletal:        General: No tenderness.     Right lower leg: No edema.     Left lower leg: No edema.  Skin:    General: Skin is warm and dry.  Neurological:     General: No focal deficit present.     Mental Status: She is alert. Mental status is at baseline.  Psychiatric:        Mood and Affect: Mood normal.        Behavior: Behavior normal.     ED Results and Treatments Labs (all labs ordered are listed, but only abnormal results are displayed) Labs Reviewed  BASIC METABOLIC PANEL WITH GFR - Abnormal; Notable for the following components:      Result Value   Chloride 96 (*)    CO2 34 (*)    Glucose, Bld 118 (*)    Creatinine, Ser 0.35 (*)    Calcium  8.8 (*)    All other components within normal limits  CBC - Abnormal; Notable for the following components:   RBC 3.34 (*)    Hemoglobin 8.4 (*)    HCT 27.2 (*)    MCH 25.1 (*)    RDW 21.3 (*)    Platelets 510 (*)    nRBC 0.3 (*)    All other components within normal limits  BLOOD GAS, VENOUS - Abnormal; Notable for the following components:   pCO2, Ven 72 (*)    pO2, Ven 31 (*)    Bicarbonate 40.7 (*)    Acid-Base Excess 12.0 (*)    All other components within normal limits                                                                                                                          Radiology DG Chest 2 View Result Date: 09/17/2023 CLINICAL  DATA:  Dyspnea, lethargy, cough EXAM: CHEST - 2 VIEW COMPARISON:  08/09/2023 FINDINGS: Lungs are hyperinflated in keeping with changes of underlying COPD. Small right pleural effusion has developed. There is developing focal infiltrate within the basilar right upper lobe as well as patchy infiltrate within the right apex in keeping with changes of multifocal infection in the appropriate clinical setting. No pneumothorax. Cardiac size within normal limits. Right internal jugular chest port tip seen within the superior vena cava. No acute bone abnormality. IMPRESSION: 1. Developing multifocal pulmonary infiltrate, in keeping with changes of multifocal infection in the appropriate clinical setting.  Small right pleural effusion. 2. COPD. Electronically Signed   By: Worthy Heads M.D.   On: 09/17/2023 22:04    Pertinent labs & imaging results that were available during my care of the patient were reviewed by me and considered in my medical decision making (see MDM for details).  Medications Ordered in ED Medications  azithromycin  (ZITHROMAX ) 500 mg in sodium chloride  0.9 % 250 mL IVPB (500 mg Intravenous New Bag/Given 09/17/23 2306)  cefTRIAXone  (ROCEPHIN ) 2 g in sodium chloride  0.9 % 100 mL IVPB (0 g Intravenous Stopped 09/17/23 2306)  ipratropium-albuterol  (DUONEB) 0.5-2.5 (3) MG/3ML nebulizer solution 3 mL (3 mLs Nebulization Given 09/17/23 2218)  methylPREDNISolone  sodium succinate (SOLU-MEDROL ) 125 mg/2 mL injection 125 mg (125 mg Intravenous Given 09/17/23 2231)  ipratropium-albuterol  (DUONEB) 0.5-2.5 (3) MG/3ML nebulizer solution 3 mL (3 mLs Nebulization Given 09/17/23 2303)                                                                                                                                     Procedures Procedures  (including critical care time)  Medical Decision Making / ED Course   MDM:  73 year old presenting to the emergency department shortness of breath.  Patient chronically  ill-appearing, mild respiratory distress.  On home oxygen , not hypoxic on 3 L.  Suspect COPD exacerbation.  Patient received breathing treatment, steroids, reports some improvement but still very symptomatic.  Chest x-ray also shows findings concerning for multifocal pneumonia.  Given her comorbidities, feel patient would be best served with admission for further treatment of her symptoms and the patient in close monitoring.  Patient agreeable.  Discussed with hospitalist who agrees.  Labs also notable for chronic hypercapnia, without sign of acute hypercapnia.  Patient will be admitted for further management.      Additional history obtained: -Additional history obtained from ems -External records from outside source obtained and reviewed including: Chart review including previous notes, labs, imaging, consultation notes including prior notes    Lab Tests: -I ordered, reviewed, and interpreted labs.   The pertinent results include:   Labs Reviewed  BASIC METABOLIC PANEL WITH GFR - Abnormal; Notable for the following components:      Result Value   Chloride 96 (*)    CO2 34 (*)    Glucose, Bld 118 (*)    Creatinine, Ser 0.35 (*)    Calcium  8.8 (*)    All other components within normal limits  CBC - Abnormal; Notable for the following components:   RBC 3.34 (*)    Hemoglobin 8.4 (*)    HCT 27.2 (*)    MCH 25.1 (*)    RDW 21.3 (*)    Platelets 510 (*)    nRBC 0.3 (*)    All other components within normal limits  BLOOD GAS, VENOUS - Abnormal; Notable for the following components:   pCO2, Ven 72 (*)    pO2, Ven 31 (*)  Bicarbonate 40.7 (*)    Acid-Base Excess 12.0 (*)    All other components within normal limits    Notable for chronic anemia   EKG   EKG Interpretation Date/Time:  Thursday September 17 2023 21:09:28 EDT Ventricular Rate:  112 PR Interval:  137 QRS Duration:  82 QT Interval:  338 QTC Calculation: 460 R Axis:   105  Text Interpretation: Sinus tachycardia  Multiple premature complexes, vent & supraven Probable left atrial enlargement Right axis deviation Low voltage, extremity and precordial leads Nonspecific T abnrm, anterolateral leads Confirmed by Hiawatha Lout ([id number]) on 09/17/2023 11:17:58 PM         Imaging Studies ordered: I ordered imaging studies including CXR On my interpretation imaging demonstrates pneumonia I independently visualized and interpreted imaging. I agree with the radiologist interpretation   Medicines ordered and prescription drug management: Meds ordered this encounter  Medications   cefTRIAXone  (ROCEPHIN ) 2 g in sodium chloride  0.9 % 100 mL IVPB    Antibiotic Indication::   CAP   azithromycin  (ZITHROMAX ) 500 mg in sodium chloride  0.9 % 250 mL IVPB    Antibiotic Indication::   CAP   ipratropium-albuterol  (DUONEB) 0.5-2.5 (3) MG/3ML nebulizer solution 3 mL   methylPREDNISolone  sodium succinate (SOLU-MEDROL ) 125 mg/2 mL injection 125 mg   ipratropium-albuterol  (DUONEB) 0.5-2.5 (3) MG/3ML nebulizer solution 3 mL    -I have reviewed the patients home medicines and have made adjustments as needed   Consultations Obtained: I requested consultation with the hospitalist,  and discussed lab and imaging findings as well as pertinent plan - they recommend: admission   Cardiac Monitoring: The patient was maintained on a cardiac monitor.  I personally viewed and interpreted the cardiac monitored which showed an underlying rhythm of: sinus tachycardia   Social Determinants of Health:  Diagnosis or treatment significantly limited by social determinants of health: former smoker   Reevaluation: After the interventions noted above, I reevaluated the patient and found that their symptoms have improved  Co morbidities that complicate the patient evaluation  Past Medical History:  Diagnosis Date   Anemia    as a young woman   Arthritis    osteoartritis   Asthma    Brain tumor (benign) (HCC) 2005 [facility]    Benign   Chronic headaches    Chronic hip pain    Chronic pain    COPD (chronic obstructive pulmonary disease) (HCC)    Coronary artery disease    Depression    Depression 05/15/2016   Encounter for antineoplastic chemotherapy 01/10/2016   GERD (gastroesophageal reflux disease)    Hypertension    Lung cancer (HCC) dx'd 01/2016   currently on chemo and radiation    NSTEMI (non-ST elevated myocardial infarction) (HCC) yrs ago   On home O2    qhs 2 liters at hs and prn   Pneumonia last time 2 yrs ago   Shortness of breath dyspnea    with activity      Dispostion: Disposition decision including need for hospitalization was considered, and patient admitted to the hospital.    Final Clinical Impression(s) / ED Diagnoses Final diagnoses:  COPD exacerbation (HCC)     This chart was dictated using voice recognition software.  Despite best efforts to proofread,  errors can occur which can change the documentation meaning.    Mordecai Applebaum, MD 09/17/23 [phone]

## 2023-09-18 DIAGNOSIS — J441 Chronic obstructive pulmonary disease with (acute) exacerbation: Secondary | ICD-10-CM | POA: Diagnosis not present

## 2023-09-18 DIAGNOSIS — C3491 Malignant neoplasm of unspecified part of right bronchus or lung: Secondary | ICD-10-CM | POA: Diagnosis not present

## 2023-09-18 DIAGNOSIS — I10 Essential (primary) hypertension: Secondary | ICD-10-CM | POA: Diagnosis not present

## 2023-09-18 DIAGNOSIS — J449 Chronic obstructive pulmonary disease, unspecified: Secondary | ICD-10-CM | POA: Insufficient documentation

## 2023-09-18 DIAGNOSIS — J9601 Acute respiratory failure with hypoxia: Secondary | ICD-10-CM

## 2023-09-18 DIAGNOSIS — J9602 Acute respiratory failure with hypercapnia: Secondary | ICD-10-CM

## 2023-09-18 LAB — CBC
HCT: 27.1 % — ABNORMAL LOW (ref 36.0–46.0)
Hemoglobin: 8.1 g/dL — ABNORMAL LOW (ref 12.0–15.0)
MCH: 24.5 pg — ABNORMAL LOW (ref 26.0–34.0)
MCHC: 29.9 g/dL — ABNORMAL LOW (ref 30.0–36.0)
MCV: 82.1 fL (ref 80.0–100.0)
Platelets: 546 10*3/uL — ABNORMAL HIGH (ref 150–400)
RBC: 3.3 MIL/uL — ABNORMAL LOW (ref 3.87–5.11)
RDW: 21.2 % — ABNORMAL HIGH (ref 11.5–15.5)
WBC: 7.1 10*3/uL (ref 4.0–10.5)
nRBC: 0.6 % — ABNORMAL HIGH (ref 0.0–0.2)

## 2023-09-18 LAB — BASIC METABOLIC PANEL WITH GFR
Anion gap: 10 (ref 5–15)
BUN: 12 mg/dL (ref 8–23)
CO2: 35 mmol/L — ABNORMAL HIGH (ref 22–32)
Calcium: 8.6 mg/dL — ABNORMAL LOW (ref 8.9–10.3)
Chloride: 98 mmol/L (ref 98–111)
Creatinine, Ser: 0.39 mg/dL — ABNORMAL LOW (ref 0.44–1.00)
GFR, Estimated: >60 mL/min (ref >60)
Glucose, Bld: 152 mg/dL — ABNORMAL HIGH (ref 70–99)
Potassium: 4.6 mmol/L (ref 3.5–5.1)
Sodium: 143 mmol/L (ref 135–145)

## 2023-09-18 LAB — MRSA NEXT GEN BY PCR, NASAL: MRSA by PCR Next Gen: NOT DETECTED

## 2023-09-18 MED ORDER — ACETAMINOPHEN 650 MG RE SUPP: 650.0000 mg | Freq: Four times a day (QID) | RECTAL | Status: DC | PRN

## 2023-09-18 MED ORDER — AZITHROMYCIN 250 MG PO TABS
500.0000 mg | ORAL_TABLET | Freq: Every day | ORAL | Status: DC
  Administered 2023-09-18 – 2023-09-20 (×3): 500 mg via ORAL
  Filled 2023-09-18 (×3): qty 2

## 2023-09-18 MED ORDER — METHYLPREDNISOLONE SODIUM SUCC 125 MG IJ SOLR
60.0000 mg | Freq: Two times a day (BID) | INTRAMUSCULAR | Status: DC
  Administered 2023-09-18 – 2023-09-22 (×9): 60 mg via INTRAVENOUS
  Filled 2023-09-18 (×9): qty 2

## 2023-09-18 MED ORDER — TAMSULOSIN HCL 0.4 MG PO CAPS
0.4000 mg | ORAL_CAPSULE | Freq: Every day | ORAL | Status: DC
  Administered 2023-09-18 – 2023-09-21 (×4): 0.4 mg via ORAL
  Filled 2023-09-18 (×5): qty 1

## 2023-09-18 MED ORDER — GUAIFENESIN-DM 100-10 MG/5ML PO SYRP
5.0000 mL | ORAL_SOLUTION | ORAL | Status: DC | PRN
  Administered 2023-09-19: 5 mL via ORAL
  Filled 2023-09-18: qty 5

## 2023-09-18 MED ORDER — ENOXAPARIN SODIUM 40 MG/0.4ML IJ SOSY
40.0000 mg | PREFILLED_SYRINGE | INTRAMUSCULAR | Status: DC
  Administered 2023-09-18 – 2023-09-22 (×5): 40 mg via SUBCUTANEOUS
  Filled 2023-09-18 (×4): qty 0.4

## 2023-09-18 MED ORDER — BUDESON-GLYCOPYRROL-FORMOTEROL 160-9-4.8 MCG/ACT IN AERO
2.0000 | INHALATION_SPRAY | Freq: Two times a day (BID) | RESPIRATORY_TRACT | Status: DC
  Administered 2023-09-18 – 2023-09-22 (×8): 2 via RESPIRATORY_TRACT
  Filled 2023-09-18 (×3): qty 5.9

## 2023-09-18 MED ORDER — OXYCODONE-ACETAMINOPHEN 5-325 MG PO TABS
1.0000 | ORAL_TABLET | Freq: Three times a day (TID) | ORAL | Status: DC | PRN
  Administered 2023-09-18 – 2023-09-20 (×3): 1 via ORAL
  Filled 2023-09-18 (×3): qty 1

## 2023-09-18 MED ORDER — ONDANSETRON HCL 4 MG/2ML IJ SOLN: 4.0000 mg | Freq: Four times a day (QID) | INTRAMUSCULAR | Status: DC | PRN

## 2023-09-18 MED ORDER — AZITHROMYCIN 250 MG PO TABS: 500.0000 mg | ORAL_TABLET | Freq: Every day | ORAL | Status: DC

## 2023-09-18 MED ORDER — IPRATROPIUM-ALBUTEROL 0.5-2.5 (3) MG/3ML IN SOLN
3.0000 mL | Freq: Four times a day (QID) | RESPIRATORY_TRACT | Status: DC
  Administered 2023-09-18 – 2023-09-20 (×12): 3 mL via RESPIRATORY_TRACT
  Filled 2023-09-18 (×12): qty 3

## 2023-09-18 MED ORDER — TRAZODONE HCL 50 MG PO TABS
50.0000 mg | ORAL_TABLET | Freq: Every day | ORAL | Status: DC
  Administered 2023-09-18 – 2023-09-21 (×4): 50 mg via ORAL
  Filled 2023-09-18 (×4): qty 1

## 2023-09-18 MED ORDER — ACETAMINOPHEN 325 MG PO TABS
650.0000 mg | ORAL_TABLET | Freq: Four times a day (QID) | ORAL | Status: DC | PRN
Start: 2023-09-18 — End: 2023-09-22
  Administered 2023-09-19 – 2023-09-22 (×4): 650 mg via ORAL
  Filled 2023-09-18 (×4): qty 2

## 2023-09-18 MED ORDER — ONDANSETRON HCL 4 MG PO TABS: 4.0000 mg | ORAL_TABLET | Freq: Four times a day (QID) | ORAL | Status: DC | PRN

## 2023-09-18 MED ORDER — CHLORHEXIDINE GLUCONATE CLOTH 2 % EX PADS
6.0000 | MEDICATED_PAD | Freq: Every day | CUTANEOUS | Status: DC
  Administered 2023-09-18 – 2023-09-22 (×5): 6 via TOPICAL

## 2023-09-18 MED ORDER — METHYLPREDNISOLONE SODIUM SUCC 40 MG IJ SOLR: 40.0000 mg | Freq: Two times a day (BID) | INTRAMUSCULAR | Status: DC

## 2023-09-18 MED ORDER — ALBUTEROL SULFATE (2.5 MG/3ML) 0.083% IN NEBU: 2.5000 mg | INHALATION_SOLUTION | RESPIRATORY_TRACT | Status: DC | PRN

## 2023-09-18 NOTE — Assessment & Plan Note (Signed)
 Cell count has been stable.

## 2023-09-18 NOTE — Progress Notes (Signed)
 PROGRESS NOTE   Courtney Zuniga  ZOX:[id number] DOB: 09/14/1950 DOA: 09/17/2023 PCP: Caresse Chant, FNP   Chief Complaint  Patient presents with   Shortness of Breath   Level of care: Stepdown  Brief Admission History:   73 y.o. female with medical history significant of COPD, asthma, coronary artery disease, depression and lung cancer who presents with dyspnea.  Reports 7 days of worsening dyspnea, associated with cough, wheezing and increased sputum production.  Dyspnea worse with exertion, moderate to severe in intensity, associated with chills, no improving factors.  Patient has been  using her inhalers and nebulizer medications with no improvement in her symptoms.  Because severity of her symptoms she called EMS. She was found in respiratory distress, received nebulizer treatment and was transported to the ED.    Patient uses supplemental 02 at home 4 L/min at her baseline.  She has recurrent non small cell lung cancer, patient has been treated with immunotherapy, last time was in 2022  Currently under observation.    Assessment and Plan:  COPD exacerbation  Acute on chronic hypoxemic and hypercapnic respiratory failure.  Pt appeared to be in CO2 narcosis when rounding this morning, ordered STAT bipap and transfer to stepdown ICU.  Continue medical therapy with systemic corticosteroids, methylprednisolone  60 mg IV q 12 hrs Aggressive bronchodilator therapy, B agonist and anticholinergic Inhaled corticosteroids Antitussive  agents, airway clearing techniques with flutter valve and incentive spirometer. Antibiotic therapy with azithromycin .  PT and OT   Iron  deficiency anemia Cell count has been stable.   Depression Continue with trazodone , and duloxetine  Continue pain control with oxycodone .   Adenocarcinoma of right lung, stage 4 Currently under observation.  Old records personally reviewed.  Follow up as outpatient with oncology   HTN (hypertension) Patient not taking  bisoprolol .  Continue blood pressure monitoring    DVT prophylaxis: enoxaparin  Code Status: Full  Family Communication:  Disposition: transfer to stepdown ICU   Consultants:   Procedures:   Antimicrobials:    Subjective: Pt found to be lying in ED room obtunded; called for bipap therapy immediately and transfer to stepdown ICU  Objective: Vitals:   09/18/23 1045 09/18/23 1049 09/18/23 1115 09/18/23 1119  BP: 133/83  129/79   Pulse:    (!) 107  Resp: (!) 37     Temp:  97.9 F (36.6 C)    TempSrc:  Axillary    SpO2:    97%  Weight:   55.4 kg   Height:   5\' 6"  (1.676 m)     Intake/Output Summary (Last 24 hours) at 09/18/2023 1303 Last data filed at 09/18/2023 0014 Gross per 24 hour  Intake 350 ml  Output --  Net 350 ml   Filed Weights   09/17/23 2111 09/18/23 1115  Weight: 54.3 kg 55.4 kg   Examination:  General exam: pt obtunded and appeared to be in narcosis Respiratory system: poor air movement.  Severe increased work of breathing.   Cardiovascular system: normal S1 & S2 heard. No JVD, murmurs, rubs, gallops or clicks. No pedal edema. Gastrointestinal system: Abdomen is nondistended, soft and nontender. No organomegaly or masses felt. Normal bowel sounds heard. Central nervous system: Alert and oriented. No focal neurological deficits. Extremities: Symmetric 5 x 5 power. Skin: No rashes, lesions or ulcers. Psychiatry: Judgement and insight appear poor. Mood & affect UTD.   Data Reviewed: I have personally reviewed following labs and imaging studies  CBC: Recent Labs  Lab 09/17/23 2125 09/18/23 0554  WBC 6.8  7.1  HGB 8.4* 8.1*  HCT 27.2* 27.1*  MCV 81.4 82.1  PLT 510* 546*    Basic Metabolic Panel: Recent Labs  Lab 09/17/23 2125 09/18/23 0554  NA 141 143  K 4.1 4.6  CL 96* 98  CO2 34* 35*  GLUCOSE 118* 152*  BUN 10 12  CREATININE 0.35* 0.39*  CALCIUM  8.8* 8.6*    CBG: No results for input(s): "GLUCAP" in the last 168 hours.  No results  found for this or any previous visit (from the past 240 hours).   Radiology Studies: DG Chest 2 View Result Date: 09/17/2023 CLINICAL DATA:  Dyspnea, lethargy, cough EXAM: CHEST - 2 VIEW COMPARISON:  08/09/2023 FINDINGS: Lungs are hyperinflated in keeping with changes of underlying COPD. Small right pleural effusion has developed. There is developing focal infiltrate within the basilar right upper lobe as well as patchy infiltrate within the right apex in keeping with changes of multifocal infection in the appropriate clinical setting. No pneumothorax. Cardiac size within normal limits. Right internal jugular chest port tip seen within the superior vena cava. No acute bone abnormality. IMPRESSION: 1. Developing multifocal pulmonary infiltrate, in keeping with changes of multifocal infection in the appropriate clinical setting. Small right pleural effusion. 2. COPD. Electronically Signed   By: Worthy Heads M.D.   On: 09/17/2023 22:04    Scheduled Meds:  azithromycin   500 mg Oral QHS   budesonide -glycopyrrolate -formoterol   2 puff Inhalation BID   Chlorhexidine  Gluconate Cloth  6 each Topical Q0600   enoxaparin  (LOVENOX ) injection  40 mg Subcutaneous Q24H   ipratropium-albuterol   3 mL Nebulization Q6H   methylPREDNISolone  (SOLU-MEDROL ) injection  60 mg Intravenous Q12H   tamsulosin   0.4 mg Oral QPC supper   traZODone   50 mg Oral QHS   Continuous Infusions:   LOS: 1 day   Critical Care Procedure Note Authorized and Performed by: Olga Berthold MD  Total Critical Care time:  70 mins Due to a high probability of clinically significant, life threatening deterioration, the patient required my highest level of preparedness to intervene emergently and I personally spent this critical care time directly and personally managing the patient.  This critical care time included obtaining a history; examining the patient, pulse oximetry; ordering and review of studies; arranging urgent treatment with development  of a management plan; evaluation of patient's response of treatment; frequent reassessment; and discussions with other providers.  This critical care time was performed to assess and manage the high probability of imminent and life threatening deterioration that could result in multi-organ failure.  It was exclusive of separately billable procedures and treating other patients and teaching time.    Faustino Hook, MD How to contact the [facility] Attending or Consulting provider 7A - 7P or covering provider during after hours 7P -7A, for this patient?  Check the care team in [facility] and look for a) attending/consulting [facility] provider listed and b) the [facility] team listed Log into [email or web address] to find provider on call.  Locate the [facility] provider you are looking for under [facility] and page to a number that you can be directly reached. If you still have difficulty reaching the provider, please page the [facility] (Director on Call) for the Hospitalists listed on amion for assistance.  09/18/2023, 1:03 PM

## 2023-09-18 NOTE — Evaluation (Signed)
 Occupational Therapy Evaluation Patient Details Name: Courtney Zuniga MRN: [id number] DOB: [birth year] Today's Date: 09/18/2023   History of Present Illness   Courtney Zuniga is a 73 y.o. female with medical history significant of COPD, asthma, coronary artery disease, depression and lung cancer who presents with dyspnea.  Reports 7 days of worsening dyspnea, associated with cough, wheezing and increased sputum production.  Dyspnea worse with exertion, moderate to severe in intensity, associated with chills, no improving factors.   Patient has been  using her inhalers and nebulizer medications with no improvement in her symptoms.  Because severity of her symptoms she called EMS. She was found in respiratory distress, received nebulizer treatment and was transported to the ED. (per MD)     Clinical Impressions Pt agreeable to OT and PT co-evaluation. Pt demonstrates need for supervision assist for bed mobility and CGA for tranfers with and without AD. Pt demonstrates slow labored movement throughout the session with frequent rest breaks. Pt donning supplemental O2 for the duration of the session. B UE generally weak with good functional use. Able to complete ADL's with set up to CGA depending on if the pt is standing or not. Pt left in the chair with call bell within reach. Pt will benefit from continued OT in the hospital and recommended venue below to increase strength, balance, and endurance for safe ADL's.        If plan is discharge home, recommend the following:   A little help with walking and/or transfers;A little help with bathing/dressing/bathroom;Assistance with cooking/housework;Assist for transportation;Help with stairs or ramp for entrance     Functional Status Assessment   Patient has had a recent decline in their functional status and demonstrates the ability to make significant improvements in function in a reasonable and predictable amount of time.     Equipment  Recommendations   None recommended by OT             Precautions/Restrictions   Precautions Precautions: Fall Recall of Precautions/Restrictions: Intact Restrictions Weight Bearing Restrictions Per Provider Order: No     Mobility Bed Mobility Overal bed mobility: Needs Assistance Bed Mobility: Supine to Sit     Supine to sit: Supervision     General bed mobility comments: mild labored movement    Transfers Overall transfer level: Needs assistance   Transfers: Sit to/from Stand, Bed to chair/wheelchair/BSC Sit to Stand: Contact guard assist     Step pivot transfers: Contact guard assist     General transfer comment: mildly usnteday for steps to chair without AD      Balance Overall balance assessment: Needs assistance Sitting-balance support: Feet supported, No upper extremity supported Sitting balance-Leahy Scale: Good Sitting balance - Comments: seated at EOB   Standing balance support: No upper extremity supported, During functional activity Standing balance-Leahy Scale: Fair Standing balance comment: poor to fair without AD                           ADL either performed or assessed with clinical judgement   ADL Overall ADL's : Needs assistance/impaired     Grooming: Supervision/safety;Contact guard assist           Upper Body Dressing : Set up;Sitting   Lower Body Dressing: Set up;Sitting/lateral leans   Toilet Transfer: Contact guard assist;Ambulation;Stand-pivot Toilet Transfer Details (indicate cue type and reason): simulated via EOB to chair without AD Toileting- Clothing Manipulation and Hygiene: Contact guard assist;Sit to/from stand;Sitting/lateral lean  Functional mobility during ADLs: Contact guard assist;Rolling walker (2 wheels) General ADL Comments: Able to ambulate to the door and back to chair.     Vision Baseline Vision/History: 1 Wears glasses Ability to See in Adequate Light: 1 Impaired Patient  Visual Report: No change from baseline Vision Assessment?: No apparent visual deficits     Perception Perception: Not tested       Praxis Praxis: Not tested       Pertinent Vitals/Pain Pain Assessment Pain Assessment: Faces Faces Pain Scale: Hurts little more Pain Location: head Pain Descriptors / Indicators: Headache Pain Intervention(s): Monitored during session     Extremity/Trunk Assessment Upper Extremity Assessment Upper Extremity Assessment: Generalized weakness   Lower Extremity Assessment Lower Extremity Assessment: Defer to PT evaluation   Cervical / Trunk Assessment Cervical / Trunk Assessment: Kyphotic   Communication Communication Communication: No apparent difficulties   Cognition Arousal: Alert Behavior During Therapy: WFL for tasks assessed/performed Cognition: No apparent impairments                               Following commands: Intact       Cueing  General Comments   Cueing Techniques: Verbal cues                 Home Living Family/patient expects to be discharged to:: Private residence Living Arrangements: Children Available Help at Discharge: Family Type of Home: House Home Access: Stairs to enter Secretary/administrator of Steps: 3 Entrance Stairs-Rails: Left Home Layout: One level     Bathroom Shower/Tub: Chief Strategy Officer: Standard Bathroom Accessibility: Yes How Accessible: Accessible via wheelchair;Accessible via walker Home Equipment: Rolling Walker (2 wheels);Shower seat   Additional Comments: Pt reports same living history as prior admission.      Prior Functioning/Environment Prior Level of Function : Needs assist             Mobility Comments: Household and short distanced community ambulator without use of AD, on 4 LPM O2 at home ADLs Comments: Independent ADL; assist IADL    OT Problem List: Decreased strength;Decreased activity tolerance   OT Treatment/Interventions:  Self-care/ADL training;Therapeutic exercise;Therapeutic activities;Patient/family education;Balance training      OT Goals(Current goals can be found in the care plan section)   Acute Rehab OT Goals Patient Stated Goal: return home OT Goal Formulation: With patient Time For Goal Achievement: 10/02/23 Potential to Achieve Goals: Good   OT Frequency:  Min 1X/week    Co-evaluation PT/OT/SLP Co-Evaluation/Treatment: Yes Reason for Co-Treatment: To address functional/ADL transfers   OT goals addressed during session: ADL's and self-care                       End of Session Equipment Utilized During Treatment: Rolling walker (2 wheels);Oxygen   Activity Tolerance: Patient tolerated treatment well Patient left: in chair;with call bell/phone within reach  OT Visit Diagnosis: Unsteadiness on feet (R26.81);Other abnormalities of gait and mobility (R26.89);Muscle weakness (generalized) (M62.81)                Time: 4098-1191 OT Time Calculation (min): 27 min Charges:  OT General Charges $OT Visit: 1 Visit OT Evaluation $OT Eval Low Complexity: 1 Low  Alano Blasco OT, MOT   Thurnell Floss 09/18/2023, 11:27 AM

## 2023-09-18 NOTE — Progress Notes (Signed)
 Having trouble getting a good reading from pulse ox.  Have switched cords and locations.  Patient did have a reading of 94% a minute ago then reading goes right back to a solid line.  Will try some other methods but may put patient on HFNC to give added humidification and broader range of flow if needed.

## 2023-09-18 NOTE — Progress Notes (Addendum)
 09/18/2023 2:14 PM  I received a communication from Alva Auer NP with [facility] outpatient palliative care/Serious Illness Care program and she informed me that she recently met with patient at home and patient is DNR and she completed a [employer] DNR form.  She said that she would fax it to case management, I also gave her my fax number.  In addition, she felt that patient qualifies now for home hospice program. Updating to DNR.  Will ask patient to consider home hospice care when discharged.    Olga Berthold, MD How to contact the [facility] Attending or Consulting provider 7A - 7P or covering provider during after hours 7P -7A, for this patient?  Check the care team in [facility] and look for a) attending/consulting [facility] provider listed and b) the [facility] team listed Log into [email or web address] and use [place]'s universal password to access. If you do not have the password, please contact the hospital operator. Locate the [facility] provider you are looking for under [facility] and page to a number that you can be directly reached. If you still have difficulty reaching the provider, please page the [facility] (Director on Call) for the Hospitalists listed on amion for assistance.

## 2023-09-18 NOTE — ED Notes (Signed)
 Helped pt move back from the chair to the bed in room.

## 2023-09-18 NOTE — Assessment & Plan Note (Signed)
 Continue with trazodone , and duloxetine  Continue pain control with oxycodone .

## 2023-09-18 NOTE — Assessment & Plan Note (Signed)
 Currently under observation.  Old records personally reviewed.  Follow up as outpatient with oncology

## 2023-09-18 NOTE — Assessment & Plan Note (Addendum)
 Patient not taking bisoprolol .  Continue blood pressure monitoring

## 2023-09-18 NOTE — Assessment & Plan Note (Signed)
 Acute on chronic hypoxemic and hypercapnic respiratory failure.   Plan to continue medical therapy with systemic corticosteroids, methylprednisolone  40 mg IV q 12 hrs Aggressive bronchodilator therapy, B agonist and anticholinergic Inhaled corticosteroids Antitussive  agents, airway clearing techniques with flutter valve and incentive spirometer. Antibiotic therapy with azithromycin .  PT and OT If no clinical improvement in the next 24 to 48 to consider further work up with CT chest with contrast.

## 2023-09-18 NOTE — Hospital Course (Addendum)
 73 y.o. female with medical history significant of COPD, asthma, coronary artery disease, depression and lung cancer who presents with dyspnea.  Reports 7 days of worsening dyspnea, associated with cough, wheezing and increased sputum production.  Dyspnea worse with exertion, moderate to severe in intensity, associated with chills, no improving factors.  Patient has been  using her inhalers and nebulizer medications with no improvement in her symptoms.  Because severity of her symptoms she called EMS. She was found in respiratory distress, received nebulizer treatment and was transported to the ED.    Patient uses supplemental 02 at home 4 L/min at her baseline.  She has recurrent non small cell lung cancer, patient has been treated with immunotherapy, last time was in 2022  Currently under observation.

## 2023-09-18 NOTE — ED Notes (Signed)
 Called ICU. Receiving nurse in room with pt at this time and will call back shortly I was told.

## 2023-09-18 NOTE — Plan of Care (Signed)
  Problem: Acute Rehab OT Goals (only OT should resolve) Goal: Pt. Will Perform Grooming Flowsheets (Taken 09/18/2023 1129) Pt Will Perform Grooming: with modified independence Goal: Pt. Will Perform Lower Body Dressing Flowsheets (Taken 09/18/2023 1129) Pt Will Perform Lower Body Dressing: with modified independence Goal: Pt. Will Transfer To Toilet Flowsheets (Taken 09/18/2023 1129) Pt Will Transfer to Toilet: with modified independence Goal: Pt. Will Perform Toileting-Clothing Manipulation Flowsheets (Taken 09/18/2023 1129) Pt Will Perform Toileting - Clothing Manipulation and hygiene: with modified independence Goal: Pt/Caregiver Will Perform Home Exercise Program Flowsheets (Taken 09/18/2023 1129) Pt/caregiver will Perform Home Exercise Program:  Increased strength  Both right and left upper extremity  Independently  Chelcey Caputo OT, MOT

## 2023-09-18 NOTE — Evaluation (Signed)
 Physical Therapy Evaluation Patient Details Name: Courtney Zuniga MRN: [id number] DOB: 12-01-1950 Today's Date: 09/18/2023  History of Present Illness  Courtney Zuniga is a 73 y.o. female with medical history significant of COPD, asthma, coronary artery disease, depression and lung cancer who presents with dyspnea.  Reports 7 days of worsening dyspnea, associated with cough, wheezing and increased sputum production.  Dyspnea worse with exertion, moderate to severe in intensity, associated with chills, no improving factors.   Patient has been  using her inhalers and nebulizer medications with no improvement in her symptoms.  Because severity of her symptoms she called EMS. She was found in respiratory distress, received nebulizer treatment and was transported to the ED.   Clinical Impression  Patient required frequent rest breaks before able to sit up at bedside, able to transfer to chair without AD, but had to lean on armrest of chair, safer using RW and limited to taking steps in room before having to sit due to c/o fatigue and generalized weakness. Patient tolerated sitting up in chair after therapy. Patient will benefit from continued skilled physical therapy in hospital and recommended venue below to increase strength, balance, endurance for safe ADLs and gait.          If plan is discharge home, recommend the following: A little help with walking and/or transfers;Help with stairs or ramp for entrance;A little help with bathing/dressing/bathroom;Assistance with cooking/housework   Can travel by private vehicle   Yes    Equipment Recommendations None recommended by PT  Recommendations for Other Services       Functional Status Assessment Patient has had a recent decline in their functional status and demonstrates the ability to make significant improvements in function in a reasonable and predictable amount of time.     Precautions / Restrictions Precautions Precautions: Fall Recall of  Precautions/Restrictions: Intact Restrictions Weight Bearing Restrictions Per Provider Order: No      Mobility  Bed Mobility Overal bed mobility: Needs Assistance Bed Mobility: Supine to Sit     Supine to sit: Supervision     General bed mobility comments: labored movement, increased time    Transfers Overall transfer level: Needs assistance   Transfers: Sit to/from Stand, Bed to chair/wheelchair/BSC Sit to Stand: Contact guard assist   Step pivot transfers: Contact guard assist       General transfer comment: able to transfer without use of RW leaning on armrest of chair, safer using RW    Ambulation/Gait Ambulation/Gait assistance: Contact guard assist, Min assist Gait Distance (Feet): 15 Feet Assistive device: Rolling walker (2 wheels) Gait Pattern/deviations: Decreased step length - left, Decreased stance time - right, Decreased stride length Gait velocity: decreased     General Gait Details: slow labored movement without loss of balance, on 4 LPM with SpO2 at 93% and limited mostly due to c/o fatigue  Stairs            Wheelchair Mobility     Tilt Bed    Modified Rankin (Stroke Patients Only)       Balance Overall balance assessment: Needs assistance Sitting-balance support: Feet supported Sitting balance-Leahy Scale: Good Sitting balance - Comments: seated at EOB   Standing balance support: No upper extremity supported, During functional activity Standing balance-Leahy Scale: Fair Standing balance comment: poor to fair without AD                             Pertinent Vitals/Pain Pain Assessment Pain  Assessment: Faces Faces Pain Scale: Hurts little more Pain Location: head Pain Descriptors / Indicators: Headache Pain Intervention(s): Monitored during session    Home Living Family/patient expects to be discharged to:: Private residence Living Arrangements: Children Available Help at Discharge: Family Type of Home:  House Home Access: Stairs to enter Entrance Stairs-Rails: Left Entrance Stairs-Number of Steps: 3   Home Layout: One level Home Equipment: Agricultural consultant (2 wheels);Shower seat Additional Comments: Pt reports same living history as prior admission.    Prior Function Prior Level of Function : Needs assist       Physical Assist : Mobility (physical);ADLs (physical) Mobility (physical): Bed mobility;Transfers;Gait;Stairs   Mobility Comments: Household and short distanced community ambulator without use of AD, on 4 LPM O2 at home ADLs Comments: Independent ADL; assist IADL     Extremity/Trunk Assessment   Upper Extremity Assessment Upper Extremity Assessment: Defer to OT evaluation    Lower Extremity Assessment Lower Extremity Assessment: Generalized weakness    Cervical / Trunk Assessment Cervical / Trunk Assessment: Kyphotic  Communication   Communication Communication: No apparent difficulties    Cognition Arousal: Alert Behavior During Therapy: WFL for tasks assessed/performed   PT - Cognitive impairments: No apparent impairments                         Following commands: Intact       Cueing Cueing Techniques: Verbal cues     General Comments      Exercises     Assessment/Plan    PT Assessment Patient needs continued PT services  PT Problem List Decreased strength;Decreased activity tolerance;Decreased balance;Decreased mobility       PT Treatment Interventions DME instruction;Stair training;Gait training;Functional mobility training;Therapeutic activities;Therapeutic exercise;Balance training;Patient/family education    PT Goals (Current goals can be found in the Care Plan section)  Acute Rehab PT Goals Patient Stated Goal: return home with family to assist PT Goal Formulation: With patient Time For Goal Achievement: 10/02/23 Potential to Achieve Goals: Good    Frequency Min 3X/week     Co-evaluation PT/OT/SLP  Co-Evaluation/Treatment: Yes Reason for Co-Treatment: To address functional/ADL transfers PT goals addressed during session: Mobility/safety with mobility;Balance;Proper use of DME OT goals addressed during session: ADL's and self-care       AM-PAC PT "6 Clicks" Mobility  Outcome Measure Help needed turning from your back to your side while in a flat bed without using bedrails?: None Help needed moving from lying on your back to sitting on the side of a flat bed without using bedrails?: A Little Help needed moving to and from a bed to a chair (including a wheelchair)?: A Little Help needed standing up from a chair using your arms (e.g., wheelchair or bedside chair)?: A Little Help needed to walk in hospital room?: A Little Help needed climbing 3-5 steps with a railing? : A Lot 6 Click Score: 18    End of Session   Activity Tolerance: Patient tolerated treatment well;Patient limited by fatigue Patient left: in chair;with call bell/phone within reach Nurse Communication: Mobility status PT Visit Diagnosis: Unsteadiness on feet (R26.81);Other abnormalities of gait and mobility (R26.89);Muscle weakness (generalized) (M62.81)    Time: 4098-1191 PT Time Calculation (min) (ACUTE ONLY): 30 min   Charges:   PT Evaluation $PT Eval Moderate Complexity: 1 Mod PT Treatments $Therapeutic Activity: 23-37 mins PT General Charges $$ ACUTE PT VISIT: 1 Visit         1:55 PM, 09/18/23 Walton Guppy, MPT Physical  Therapist with [facility] 336 [phone] office [phone] mobile phone

## 2023-09-18 NOTE — TOC Initial Note (Addendum)
 Transition of Care [facility]) - Initial/Assessment Note    Patient Details  Name: Courtney Zuniga MRN: [id number] Date of Birth: 1950-07-22  Transition of Care [facility]) CM/SW Contact:    Cyndie Dredge, LCSWA Phone Number: 09/18/2023, 1:47 PM  Clinical Narrative:               Patient is a risk for readmission. Patient was admitted for COPD exacerbation . Writer spoke with patient daughter to discuss PT recommendation for SNF. Daughter and Patient agreed agreeable. Patient lives at home with her son , has a walker, and is fairly independent. Patient I is also connected with a [facility] transportation for appointments as needed. Daughter wants referral sent to [facility]. Referral sent via HUB. TOC to follow.   Addendum 2:15 pm  Patient daughter called Clinical research associate back asking for patient information to be sent to [facility] instead of [employer].   Expected Discharge Plan: Home w Home Health Services Barriers to Discharge: Continued Medical Work up   Patient Goals and CMS Choice Patient states their goals for this hospitalization and ongoing recovery are:: DC back home CMS Medicare.gov Compare Post Acute Care list provided to:: Patient Choice offered to / list presented to : Patient      Expected Discharge Plan and Services In-house Referral: Clinical Social Work   Post Acute Care Choice: Horticulturist, commercial, Home Health Living arrangements for the past 2 months: Single Family Home                           HH Arranged: Refused SNF          Prior Living Arrangements/Services Living arrangements for the past 2 months: Single Family Home Lives with:: Adult Children Patient language and need for interpreter reviewed:: Yes Do you feel safe going back to the place where you live?: Yes      Need for Family Participation in Patient Care: No (Comment) Care giver support system in place?: No (comment) Current home services: DME, Home OT, Home PT Criminal Activity/Legal Involvement  Pertinent to Current Situation/Hospitalization: No - Comment as needed  Activities of Daily Living   ADL Screening (condition at time of admission) Independently performs ADLs?: Yes (appropriate for developmental age) Is the patient deaf or have difficulty hearing?: No Does the patient have difficulty seeing, even when wearing glasses/contacts?: No Does the patient have difficulty concentrating, remembering, or making decisions?: No  Permission Sought/Granted      Share Information with NAME: Bell and [name]     Permission granted to share info w Relationship: Patient and Daughter     Emotional Assessment Appearance:: Appears stated age Attitude/Demeanor/Rapport: Engaged Affect (typically observed): Accepting, Appropriate Orientation: : Oriented to Self, Oriented to Place, Oriented to  Time, Oriented to Situation Alcohol / Substance Use: Not Applicable Psych Involvement: No (comment)  Admission diagnosis:  COPD (chronic obstructive pulmonary disease) (HCC) [J44.9] COPD exacerbation (HCC) [J44.1] Patient Active Problem List   Diagnosis Date Noted   COPD (chronic obstructive pulmonary disease) (HCC) 09/18/2023   Chronic anemia 08/11/2023   Acute metabolic encephalopathy 08/10/2023   COPD GOLD 2 criteria 2017 08/07/2023   Chronic respiratory failure with hypoxia and hypercapnia (HCC) 08/07/2023   Lobar pneumonia (HCC) 06/03/2023   Acute on chronic hypoxic respiratory failure (HCC) 05/28/2023   Sinus tachycardia 10/13/2022   (HFpEF) heart failure with preserved ejection fraction (HCC) 10/13/2022   Aortic regurgitation 10/13/2022   Aortic stenosis 10/13/2022   Nicotine  abuse 10/13/2022  DOE (dyspnea on exertion) 10/06/2022   Acute on chronic respiratory failure with hypoxia (HCC) 08/25/2022   Pleural effusion 08/25/2022   Hypotension 06/12/2022   Iron  deficiency anemia 06/12/2022   Acute on chronic anemia 05/20/2022   History of Gastric ulcer 05/20/2022   AVM  (arteriovenous malformation) of small bowel, 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   Upper GI bleed 05/18/2022   Acute blood loss anemia 05/18/2022   Acute respiratory failure with hypoxia (HCC) 04/23/2022   Frequent PVCs 04/09/2022   Port-A-Cath in place 03/03/2022   Increased frequency of headaches 10/18/2020   COPD exacerbation (HCC) 11/05/2019   Chronic nonintractable headache    Acute on chronic respiratory failure with hypoxia and hypercapnia (HCC) 06/16/2018   Influenza A 06/16/2018   Severe protein-calorie malnutrition (HCC) 06/16/2018   GERD (gastroesophageal reflux disease) 06/16/2018   Bilateral pneumonia 05/05/2018   Sepsis due to CAP 05/05/2018   Chronic respiratory failure with hypoxia (HCC) 05/05/2018   Encounter for antineoplastic immunotherapy 04/23/2017   Depression 05/15/2016   Adenocarcinoma of right lung, stage 4 01/10/2016   Encounter for antineoplastic chemotherapy 01/10/2016   Lung mass 01/03/2016   Arthritis    Subepithelial gastric mass    Mucosal abnormality of stomach    Abdominal pain 08/08/2015   Abnormal CT of the abdomen 08/08/2015   Hypoxia 06/26/2014   COPD with acute exacerbation (HCC) 06/26/2014   Community acquired bilateral lower lobe pneumonia    Chest pain of uncertain etiology    Asthma, chronic    Chest pain 02/16/2014   HTN (hypertension) 02/16/2014   DDD (degenerative disc disease), lumbar 02/04/2011   Lumbar herniated disc 01/09/2011   PCP:  Caresse Chant, FNP Pharmacy:   [facility] - [place], [place] - [address] [place] [address] Phone: [phone] Fax: [phone]  [facility] - [place], [place] - [address] [place] [address] Phone: [phone] Fax: [phone]     Social Drivers of Health (SDOH) Social History: SDOH Screenings   Food Insecurity: No Food Insecurity (09/18/2023)  Housing: Low Risk  (09/18/2023)  Transportation Needs: No  Transportation Needs (09/18/2023)  Utilities: Not At Risk (09/18/2023)  Financial Resource Strain: Low Risk  (05/05/2018)  Physical Activity: Insufficiently Active (05/05/2018)  Social Connections: Moderately Isolated (09/18/2023)  Stress: No Stress Concern Present (05/05/2018)  Tobacco Use: Medium Risk (09/17/2023)   SDOH Interventions:     Readmission Risk Interventions    09/18/2023    1:24 PM 08/10/2023   11:03 AM 05/29/2023    1:14 PM  Readmission Risk Prevention Plan  Transportation Screening Complete Complete Complete  HRI or Home Care Consult Complete Complete Complete  Social Work Consult for Recovery Care Planning/Counseling Complete Complete Complete  Palliative Care Screening Complete Complete Not Applicable  Medication Review Oceanographer) Complete Complete Complete

## 2023-09-18 NOTE — Plan of Care (Signed)
  Problem: Acute Rehab PT Goals(only PT should resolve) Goal: Pt Will Go Supine/Side To Sit Outcome: Progressing Flowsheets (Taken 09/18/2023 1400) Pt will go Supine/Side to Sit: with modified independence Goal: Patient Will Transfer Sit To/From Stand Outcome: Progressing Flowsheets (Taken 09/18/2023 1400) Patient will transfer sit to/from stand: with modified independence Goal: Pt Will Transfer Bed To Chair/Chair To Bed Outcome: Progressing Flowsheets (Taken 09/18/2023 1400) Pt will Transfer Bed to Chair/Chair to Bed: with modified independence Goal: Pt Will Ambulate Outcome: Progressing Flowsheets (Taken 09/18/2023 1400) Pt will Ambulate:  50 feet  with contact guard assist  with minimal assist  with rolling walker   2:01 PM, 09/18/23 Walton Guppy, MPT Physical Therapist with [facility] 336 [phone] office [phone] mobile phone

## 2023-09-18 NOTE — NC FL2 (Signed)
 [place]  MEDICAID FL2 LEVEL OF CARE FORM     IDENTIFICATION  Patient Name: Courtney Zuniga Birthdate: November 28, 1950 Sex: female Admission Date (Current Location): 09/17/2023  [facility] and [place] Number:  [employer] and Address:  [facility],  [address], [place] [address]      Provider Number: [id number]  Attending Physician Name and Address:  Rayfield Cairo, MD  Relative Name and Phone Number:  Leeta Puls ( daughter ) [phone]    Current Level of Care: Hospital Recommended Level of Care: Skilled Nursing Facility Prior Approval Number:    Date Approved/Denied:   PASRR Number: pending  Discharge Plan: SNF    Current Diagnoses: Patient Active Problem List   Diagnosis Date Noted   COPD (chronic obstructive pulmonary disease) (HCC) 09/18/2023   Chronic anemia 08/11/2023   Acute metabolic encephalopathy 08/10/2023   COPD GOLD 2 criteria 2017 08/07/2023   Chronic respiratory failure with hypoxia and hypercapnia (HCC) 08/07/2023   Lobar pneumonia (HCC) 06/03/2023   Acute on chronic hypoxic respiratory failure (HCC) 05/28/2023   Sinus tachycardia 10/13/2022   (HFpEF) heart failure with preserved ejection fraction (HCC) 10/13/2022   Aortic regurgitation 10/13/2022   Aortic stenosis 10/13/2022   Nicotine  abuse 10/13/2022   DOE (dyspnea on exertion) 10/06/2022   Acute on chronic respiratory failure with hypoxia (HCC) 08/25/2022   Pleural effusion 08/25/2022   Hypotension 06/12/2022   Iron  deficiency anemia 06/12/2022   Acute on chronic anemia 05/20/2022   History of Gastric ulcer 05/20/2022   AVM (arteriovenous malformation) of small bowel, 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   Upper GI bleed 05/18/2022   Acute blood loss anemia 05/18/2022   Acute respiratory failure with hypoxia (HCC) 04/23/2022   Frequent PVCs 04/09/2022   Port-A-Cath in place 03/03/2022   Increased frequency of headaches 10/18/2020   COPD exacerbation (HCC) 11/05/2019    Chronic nonintractable headache    Acute on chronic respiratory failure with hypoxia and hypercapnia (HCC) 06/16/2018   Influenza A 06/16/2018   Severe protein-calorie malnutrition (HCC) 06/16/2018   GERD (gastroesophageal reflux disease) 06/16/2018   Bilateral pneumonia 05/05/2018   Sepsis due to CAP 05/05/2018   Chronic respiratory failure with hypoxia (HCC) 05/05/2018   Encounter for antineoplastic immunotherapy 04/23/2017   Depression 05/15/2016   Adenocarcinoma of right lung, stage 4 01/10/2016   Encounter for antineoplastic chemotherapy 01/10/2016   Lung mass 01/03/2016   Arthritis    Subepithelial gastric mass    Mucosal abnormality of stomach    Abdominal pain 08/08/2015   Abnormal CT of the abdomen 08/08/2015   Hypoxia 06/26/2014   COPD with acute exacerbation (HCC) 06/26/2014   Community acquired bilateral lower lobe pneumonia    Chest pain of uncertain etiology    Asthma, chronic    Chest pain 02/16/2014   HTN (hypertension) 02/16/2014   DDD (degenerative disc disease), lumbar 02/04/2011   Lumbar herniated disc 01/09/2011    Orientation RESPIRATION BLADDER Height & Weight     Self, Time, Situation  O2 (4L) Continent Weight: 122 lb 2.2 oz (55.4 kg) Height:  5\' 6"  (167.6 cm)  BEHAVIORAL SYMPTOMS/MOOD NEUROLOGICAL BOWEL NUTRITION STATUS      Continent Diet (Regular)  AMBULATORY STATUS COMMUNICATION OF NEEDS Skin   Limited Assist Verbally Normal                       Personal Care Assistance Level of Assistance  Bathing, Feeding, Dressing Bathing Assistance: Maximum assistance Feeding assistance: Independent Dressing Assistance: Maximum  assistance     Functional Limitations Info  Sight, Hearing, Speech Sight Info: Adequate Hearing Info: Adequate Speech Info: Adequate    SPECIAL CARE FACTORS FREQUENCY  PT (By licensed PT), OT (By licensed OT)     PT Frequency: 5  x a week OT Frequency: 5 x a week            Contractures Contractures Info: Not  present    Additional Factors Info  Code Status, Allergies Code Status Info: DNR-Limited Allergies Info: Desyrel  (trazodone )           Current Medications (09/18/2023):  This is the current hospital active medication list Current Facility-Administered Medications  Medication Dose Route Frequency Provider Last Rate Last Admin   acetaminophen  (TYLENOL ) tablet 650 mg  650 mg Oral Q6H PRN Arrien, Curlee Doss, MD       Or   acetaminophen  (TYLENOL ) suppository 650 mg  650 mg Rectal Q6H PRN Arrien, Mauricio Daniel, MD       albuterol  (PROVENTIL ) (2.5 MG/3ML) 0.083% nebulizer solution 2.5 mg  2.5 mg Nebulization Q2H PRN Arrien, Mauricio Daniel, MD       azithromycin  (ZITHROMAX ) tablet 500 mg  500 mg Oral QHS Arrien, Curlee Doss, MD       budesonide -glycopyrrolate -formoterol  (BREZTRI ) 160-9-4.8 MCG/ACT inhaler 2 puff  2 puff Inhalation BID Arrien, Curlee Doss, MD       Chlorhexidine  Gluconate Cloth 2 % PADS 6 each  6 each Topical Q0600 Johnson, Clanford L, MD   6 each at 09/18/23 1143   enoxaparin  (LOVENOX ) injection 40 mg  40 mg Subcutaneous Q24H Arrien, Mauricio Daniel, MD   40 mg at 09/18/23 1024   guaiFENesin -dextromethorphan  (ROBITUSSIN DM) 100-10 MG/5ML syrup 5 mL  5 mL Oral Q4H PRN Arrien, Curlee Doss, MD       ipratropium-albuterol  (DUONEB) 0.5-2.5 (3) MG/3ML nebulizer solution 3 mL  3 mL Nebulization Q6H Arrien, Curlee Doss, MD   3 mL at 09/18/23 1434   methylPREDNISolone  sodium succinate (SOLU-MEDROL ) 125 mg/2 mL injection 60 mg  60 mg Intravenous Q12H Johnson, Clanford L, MD   60 mg at 09/18/23 1024   ondansetron  (ZOFRAN ) tablet 4 mg  4 mg Oral Q6H PRN Arrien, Mauricio Daniel, MD       Or   ondansetron  (ZOFRAN ) injection 4 mg  4 mg Intravenous Q6H PRN Arrien, Mauricio Daniel, MD       oxyCODONE -acetaminophen  (PERCOCET/ROXICET) 5-325 MG per tablet 1 tablet  1 tablet Oral Q8H PRN Arrien, Curlee Doss, MD       tamsulosin  (FLOMAX ) capsule 0.4 mg  0.4 mg Oral QPC  supper Arrien, Mauricio Daniel, MD       traZODone  (DESYREL ) tablet 50 mg  50 mg Oral QHS Arrien, Curlee Doss, MD         Discharge Medications: Please see discharge summary for a list of discharge medications.  Relevant Imaging Results:  Relevant Lab Results:   Additional Information SSN: [id number]  Cyndie Dredge, [place]

## 2023-09-18 NOTE — Progress Notes (Signed)
 Patient placed on HFNC at 8L, sat now 91 to 95%.  RN made aware.

## 2023-09-18 NOTE — Plan of Care (Signed)
   Problem: Education: Goal: Knowledge of General Education information will improve Description Including pain rating scale, medication(s)/side effects and non-pharmacologic comfort measures Outcome: Progressing   Problem: Health Behavior/Discharge Planning: Goal: Ability to manage health-related needs will improve Outcome: Progressing

## 2023-09-19 DIAGNOSIS — C3491 Malignant neoplasm of unspecified part of right bronchus or lung: Secondary | ICD-10-CM | POA: Diagnosis not present

## 2023-09-19 DIAGNOSIS — R0689 Other abnormalities of breathing: Secondary | ICD-10-CM | POA: Diagnosis present

## 2023-09-19 DIAGNOSIS — I10 Essential (primary) hypertension: Secondary | ICD-10-CM | POA: Diagnosis not present

## 2023-09-19 DIAGNOSIS — J441 Chronic obstructive pulmonary disease with (acute) exacerbation: Secondary | ICD-10-CM | POA: Diagnosis not present

## 2023-09-19 LAB — BASIC METABOLIC PANEL WITH GFR
Anion gap: 9 (ref 5–15)
BUN: 18 mg/dL (ref 8–23)
CO2: 38 mmol/L — ABNORMAL HIGH (ref 22–32)
Calcium: 8.9 mg/dL (ref 8.9–10.3)
Chloride: 96 mmol/L — ABNORMAL LOW (ref 98–111)
Creatinine, Ser: 0.38 mg/dL — ABNORMAL LOW (ref 0.44–1.00)
GFR, Estimated: >60 mL/min (ref >60)
Glucose, Bld: 125 mg/dL — ABNORMAL HIGH (ref 70–99)
Potassium: 5 mmol/L (ref 3.5–5.1)
Sodium: 143 mmol/L (ref 135–145)

## 2023-09-19 LAB — CBC
HCT: 27 % — ABNORMAL LOW (ref 36.0–46.0)
Hemoglobin: 8 g/dL — ABNORMAL LOW (ref 12.0–15.0)
MCH: 24.2 pg — ABNORMAL LOW (ref 26.0–34.0)
MCHC: 29.6 g/dL — ABNORMAL LOW (ref 30.0–36.0)
MCV: 81.6 fL (ref 80.0–100.0)
Platelets: 557 10*3/uL — ABNORMAL HIGH (ref 150–400)
RBC: 3.31 MIL/uL — ABNORMAL LOW (ref 3.87–5.11)
RDW: 21.1 % — ABNORMAL HIGH (ref 11.5–15.5)
WBC: 6 10*3/uL (ref 4.0–10.5)
nRBC: 0.7 % — ABNORMAL HIGH (ref 0.0–0.2)

## 2023-09-19 MED ORDER — SENNA 8.6 MG PO TABS
1.0000 | ORAL_TABLET | Freq: Every day | ORAL | Status: DC | PRN
  Administered 2023-09-19: 8.6 mg via ORAL
  Filled 2023-09-19: qty 1

## 2023-09-19 MED ORDER — BISOPROLOL FUMARATE 5 MG PO TABS
2.5000 mg | ORAL_TABLET | Freq: Every day | ORAL | Status: DC
  Administered 2023-09-19 – 2023-09-22 (×4): 2.5 mg via ORAL
  Filled 2023-09-19 (×4): qty 1

## 2023-09-19 MED ORDER — PANTOPRAZOLE SODIUM 40 MG PO TBEC
40.0000 mg | DELAYED_RELEASE_TABLET | Freq: Every day | ORAL | Status: DC
  Administered 2023-09-19 – 2023-09-21 (×3): 40 mg via ORAL
  Filled 2023-09-19 (×4): qty 1

## 2023-09-19 MED ORDER — CYCLOBENZAPRINE HCL 10 MG PO TABS: 5.0000 mg | ORAL_TABLET | Freq: Three times a day (TID) | ORAL | Status: DC | PRN

## 2023-09-19 MED ORDER — FUROSEMIDE 10 MG/ML IJ SOLN
20.0000 mg | Freq: Every day | INTRAMUSCULAR | Status: DC
  Administered 2023-09-19 – 2023-09-21 (×3): 20 mg via INTRAVENOUS
  Filled 2023-09-19 (×3): qty 2

## 2023-09-19 NOTE — Progress Notes (Addendum)
 PROGRESS NOTE   Courtney Zuniga  VHQ:[id number] DOB: [birth year] DOA: 09/17/2023 PCP: Caresse Chant, FNP   Chief Complaint  Courtney Zuniga presents with   Shortness of Breath   Level of care: Stepdown  Brief Admission History:   73 y.o. female with medical history significant of COPD, asthma, coronary artery disease, depression and lung cancer who presents with dyspnea.  Reports 7 days of worsening dyspnea, associated with cough, wheezing and increased sputum production.  Dyspnea worse with exertion, moderate to severe in intensity, associated with chills, no improving factors.  Courtney Zuniga has been  using her inhalers and nebulizer medications with no improvement in her symptoms.  Because severity of her symptoms she called EMS. She was found in respiratory distress, received nebulizer treatment and was transported to the ED.    Courtney Zuniga uses supplemental 02 at home 4 L/min at her baseline.  She has recurrent non small cell lung cancer, Courtney Zuniga has been treated with immunotherapy, last time was in 2022.  Currently under observation.    Assessment and Plan:  COPD exacerbation  End stage COPD  Progressing chronic respiratory failure  Acute on chronic hypoxemic and hypercapnic respiratory failure.  Pt appeared to be in CO2 narcosis when rounding this morning, ordered STAT bipap and transfer to stepdown ICU.   Continue medical therapy with systemic corticosteroids, methylprednisolone  60 mg IV q 12 hrs Aggressive bronchodilator therapy, B agonist and anticholinergic Inhaled corticosteroids. Antitussive  agents, airway clearing techniques with flutter valve and incentive spirometer. Antibiotic therapy with azithromycin .  PT and OT recommending SNF placement   DNRDNI Received a communication from Alva Auer NP with [facility]'s outpatient palliative care/Serious Illness Care program and she informed me that she recently met with Courtney Zuniga at home and Courtney Zuniga agreed to [facility] and she completed a golden rod DNR  form.  She said that she would fax it to case management.  In addition, she felt that Courtney Zuniga qualifies now for home hospice program. Updated to DNR. She recommended inpatient palliative care consultation    CO2 Narcosis - improved with bipap therapy  - not sure if she still has home NIV that we tried to arrange for her a few months ago - continue bipap PRN, Q.H.S, in stepdown ICU  Iron  deficiency anemia Cell count has been stable.   Depression Continue with trazodone , and duloxetine  Continue pain control with oxycodone .   Adenocarcinoma of right lung, stage 4 Currently under observation.  Old records personally reviewed.  Follow up as outpatient with oncology   HTN (hypertension) Courtney Zuniga not taking bisoprolol .  Continue blood pressure monitoring   Chronic HFpEF IV lasix  20 mg daily started given severe respiratory distress   GERD Pantoprazole  for GI protection   DVT prophylaxis: enoxaparin  Code Status: Full  Family Communication:  Disposition: transfer to stepdown ICU   Consultants:   Procedures:   Antimicrobials:    Subjective: Pt is less altered but not back to baseline mentation, bipap has been helping, denies chest pain but continues to have severe SOB, agreeable to palliative consultation.  Objective: Vitals:   09/19/23 0500 09/19/23 0736 09/19/23 0800 09/19/23 0900  BP:   133/71 121/72  Pulse:  (!) 106 (!) 108 (!) 109  Resp:      Temp: 98 F (36.7 C) 98.3 F (36.8 C)    TempSrc: Axillary Oral    SpO2:  100% 96% 100%  Weight:      Height:        Intake/Output Summary (Last 24 hours) at 09/19/2023 1044 Last  data filed at 09/19/2023 0954 Gross per 24 hour  Intake --  Output 2700 ml  Net -2700 ml   Filed Weights   09/17/23 2111 09/18/23 1115  Weight: 54.3 kg 55.4 kg   Examination:  General exam: pt appears acutely and chronically ill; moderately distress; emaciated/cachectic Respiratory system: poor air movement.  Severe increased work of  breathing.   Cardiovascular system: normal S1 & S2 heard. No JVD, murmurs, rubs, gallops or clicks. No pedal edema. Gastrointestinal system: Abdomen is nondistended, soft and nontender. No organomegaly or masses felt. Normal bowel sounds heard. Central nervous system: Alert and oriented to person, place. No focal neurological deficits. Extremities: Symmetric 5 x 5 power. Skin: No rashes, lesions or ulcers. Psychiatry: Judgement and insight appear poor. Mood & affect UTD.   Data Reviewed: I have personally reviewed following labs and imaging studies  CBC: Recent Labs  Lab 09/17/23 2125 09/18/23 0554 09/19/23 0556  WBC 6.8 7.1 6.0  HGB 8.4* 8.1* 8.0*  HCT 27.2* 27.1* 27.0*  MCV 81.4 82.1 81.6  PLT 510* 546* 557*    Basic Metabolic Panel: Recent Labs  Lab 09/17/23 2125 09/18/23 0554 09/19/23 0556  NA 141 143 143  K 4.1 4.6 5.0  CL 96* 98 96*  CO2 34* 35* 38*  GLUCOSE 118* 152* 125*  BUN 10 12 18   CREATININE 0.35* 0.39* 0.38*  CALCIUM  8.8* 8.6* 8.9    CBG: No results for input(s): "GLUCAP" in the last 168 hours.  Recent Results (from the past 240 hours)  MRSA Next Gen by PCR, Nasal     Status: None   Collection Time: 09/18/23 11:07 AM   Specimen: Nasal Mucosa; Nasal Swab  Result Value Ref Range Status   MRSA by PCR Next Gen NOT DETECTED NOT DETECTED Final    Comment: (NOTE) The GeneXpert MRSA Assay (FDA approved for NASAL specimens only), is one component of a comprehensive MRSA colonization surveillance program. It is not intended to diagnose MRSA infection nor to guide or monitor treatment for MRSA infections. Test performance is not FDA approved in patients less than 2 years old. Performed at [facility], [address] [place], [place] [address]      Radiology Studies: DG Chest 2 View Result Date: 09/17/2023 CLINICAL DATA:  Dyspnea, lethargy, cough EXAM: CHEST - 2 VIEW COMPARISON:  08/09/2023 FINDINGS: Lungs are hyperinflated in keeping with changes of  underlying COPD. Small right pleural effusion has developed. There is developing focal infiltrate within the basilar right upper lobe as well as patchy infiltrate within the right apex in keeping with changes of multifocal infection in the appropriate clinical setting. No pneumothorax. Cardiac size within normal limits. Right internal jugular chest port tip seen within the superior vena cava. No acute bone abnormality. IMPRESSION: 1. Developing multifocal pulmonary infiltrate, in keeping with changes of multifocal infection in the appropriate clinical setting. Small right pleural effusion. 2. COPD. Electronically Signed   By: Worthy Heads M.D.   On: 09/17/2023 22:04    Scheduled Meds:  azithromycin   500 mg Oral QHS   bisoprolol   2.5 mg Oral Daily   budesonide -glycopyrrolate -formoterol   2 puff Inhalation BID   Chlorhexidine  Gluconate Cloth  6 each Topical Q0600   enoxaparin  (LOVENOX ) injection  40 mg Subcutaneous Q24H   furosemide   20 mg Intravenous Daily   ipratropium-albuterol   3 mL Nebulization Q6H   methylPREDNISolone  (SOLU-MEDROL ) injection  60 mg Intravenous Q12H   pantoprazole   40 mg Oral Q supper   tamsulosin   0.4  mg Oral QPC supper   traZODone   50 mg Oral QHS   Continuous Infusions:   LOS: 2 days   Critical Care Procedure Note Authorized and Performed by: Olga Berthold MD  Total Critical Care time:  55 mins Due to a high probability of clinically significant, life threatening deterioration, the Courtney Zuniga required my highest level of preparedness to intervene emergently and I personally spent this critical care time directly and personally managing the Courtney Zuniga.  This critical care time included obtaining a history; examining the Courtney Zuniga, pulse oximetry; ordering and review of studies; arranging urgent treatment with development of a management plan; evaluation of Courtney Zuniga's response of treatment; frequent reassessment; and discussions with other providers.  This critical care time was  performed to assess and manage the high probability of imminent and life threatening deterioration that could result in multi-organ failure.  It was exclusive of separately billable procedures and treating other patients and teaching time.    Faustino Hook, MD How to contact the [facility] Attending or Consulting provider 7A - 7P or covering provider during after hours 7P -7A, for this Courtney Zuniga?  Check the care team in [facility] and look for a) attending/consulting TRH provider listed and b) the [facility] team listed Log into [email or web address] to find provider on call.  Locate the [facility] provider you are looking for under [facility] and page to a number that you can be directly reached. If you still have difficulty reaching the provider, please page the [facility] (Director on Call) for the Hospitalists listed on amion for assistance.  09/19/2023, 10:44 AM

## 2023-09-19 NOTE — Plan of Care (Signed)

## 2023-09-19 NOTE — Progress Notes (Signed)
 Patient's Palliative Home NP Alva Auer called about faxing  patient's DNR document as per the request of Dr. Faustino Hook to be collected by the case management team, couldn't get hold of case manager as it is weekend and after hours, received the DNR document as  fax, and put in a patient's chart, will endorse to day shift RN tomorrow to pass on to Dr, Lincoln Renshaw.

## 2023-09-20 DIAGNOSIS — J9622 Acute and chronic respiratory failure with hypercapnia: Secondary | ICD-10-CM

## 2023-09-20 DIAGNOSIS — J9621 Acute and chronic respiratory failure with hypoxia: Secondary | ICD-10-CM

## 2023-09-20 DIAGNOSIS — I5032 Chronic diastolic (congestive) heart failure: Secondary | ICD-10-CM

## 2023-09-20 DIAGNOSIS — J9611 Chronic respiratory failure with hypoxia: Secondary | ICD-10-CM

## 2023-09-20 DIAGNOSIS — J441 Chronic obstructive pulmonary disease with (acute) exacerbation: Secondary | ICD-10-CM | POA: Diagnosis not present

## 2023-09-20 LAB — BASIC METABOLIC PANEL WITH GFR
Anion gap: 8 (ref 5–15)
BUN: 19 mg/dL (ref 8–23)
CO2: 37 mmol/L — ABNORMAL HIGH (ref 22–32)
Calcium: 8.9 mg/dL (ref 8.9–10.3)
Chloride: 96 mmol/L — ABNORMAL LOW (ref 98–111)
Creatinine, Ser: 0.45 mg/dL (ref 0.44–1.00)
GFR, Estimated: >60 mL/min (ref >60)
Glucose, Bld: 142 mg/dL — ABNORMAL HIGH (ref 70–99)
Potassium: 4.5 mmol/L (ref 3.5–5.1)
Sodium: 141 mmol/L (ref 135–145)

## 2023-09-20 MED ORDER — IPRATROPIUM-ALBUTEROL 0.5-2.5 (3) MG/3ML IN SOLN
3.0000 mL | Freq: Three times a day (TID) | RESPIRATORY_TRACT | Status: DC
  Administered 2023-09-21 – 2023-09-22 (×5): 3 mL via RESPIRATORY_TRACT
  Filled 2023-09-20 (×4): qty 3

## 2023-09-20 MED ORDER — OXYCODONE-ACETAMINOPHEN 5-325 MG PO TABS
1.0000 | ORAL_TABLET | ORAL | Status: DC | PRN
  Administered 2023-09-20 (×2): 1 via ORAL
  Administered 2023-09-21: 2 via ORAL
  Filled 2023-09-20: qty 1
  Filled 2023-09-20: qty 2
  Filled 2023-09-20: qty 1

## 2023-09-20 NOTE — Progress Notes (Signed)
   09/20/23 2321  BiPAP/CPAP/SIPAP  BiPAP/CPAP/SIPAP Pt Type Adult  BiPAP/CPAP/SIPAP DREAMSTATIOND  Mask Type Full face mask  Dentures removed? No (patient does not like removing dentures)  Mask Size Medium  Respiratory Rate 18 breaths/min  IPAP 10 cmH20  EPAP 5 cmH2O  Flow Rate 4 lpm  Patient Home Machine No  Patient Home Mask No  Patient Home Tubing No  Auto Titrate No  Device Plugged into RED Power Outlet Yes  BiPAP/CPAP /SiPAP Vitals  Pulse Rate (!) 105  Resp 18  SpO2 96 %  Bilateral Breath Sounds Diminished;Clear  MEWS Score/Color  MEWS Score 1  MEWS Score Color Green

## 2023-09-20 NOTE — Progress Notes (Signed)
 PROGRESS NOTE   Courtney Zuniga  ZOX:[id number] DOB: [birth year] DOA: 09/17/2023 PCP: Caresse Chant, FNP   Chief Complaint  Patient presents with   Shortness of Breath   Level of care: Telemetry  Brief Admission History:   73 y.o. female with medical history significant of COPD, asthma, coronary artery disease, depression and lung cancer who presents with dyspnea.  Reports 7 days of worsening dyspnea, associated with cough, wheezing and increased sputum production.  Dyspnea worse with exertion, moderate to severe in intensity, associated with chills, no improving factors.  Patient has been  using her inhalers and nebulizer medications with no improvement in her symptoms.  Because severity of her symptoms she called EMS. She was found in respiratory distress, received nebulizer treatment and was transported to the ED.    Patient uses supplemental 02 at home 4 L/min at her baseline.  She has recurrent non small cell lung cancer, patient has been treated with immunotherapy, last time was in 2022.  Currently under observation.    Assessment and Plan:  COPD exacerbation  End stage COPD  Progressing chronic respiratory failure  Acute on chronic hypoxemic and hypercapnic respiratory failure.  Pt appeared to be in CO2 narcosis when rounding this morning, ordered STAT bipap and transfer to stepdown ICU.   Continue medical therapy with systemic corticosteroids, methylprednisolone  60 mg IV q 12 hrs Aggressive bronchodilator therapy, B agonist and anticholinergic Inhaled corticosteroids. Antitussive  agents, airway clearing techniques with flutter valve and incentive spirometer. Antibiotic therapy with azithromycin .  PT and OT recommending SNF placement  Slowly improving, did not require BiPAP during the daytime and last 24 hours, transition to telemetry out of stepdown ICU  DNRDNI Received a communication from Alva Auer NP with [facility]'s outpatient palliative care/Serious Illness Care program and  she informed me that she recently met with patient at home and patient agreed to [facility] and she completed a golden rod DNR form.  She said that she would fax it to case management.  In addition, she felt that patient qualifies now for home hospice program. Updated to DNR. She recommended inpatient palliative care consultation which has been requested  Adult Failure to thrive  Palliative care consult requested   CO2 Narcosis - improved with bipap therapy  - not sure if she still has home NIV that we tried to arrange for her a few months ago - continue bipap PRN, Q.H.S, in stepdown ICU  Iron  deficiency anemia Hg has been stable.   Depression Continue with trazodone , and duloxetine  Continue pain control with oxycodone .   Adenocarcinoma of right lung, stage 4 Currently under observation.  Old records personally reviewed.  Follow up as outpatient with oncology  Palliative medicine consult for adult failure to thrive   HTN (hypertension) Patient not taking bisoprolol .  Continue blood pressure monitoring   Chronic HFpEF IV lasix  20 mg daily started given severe respiratory distress   GERD Pantoprazole  for GI protection   DVT prophylaxis: enoxaparin  Code Status: Full  Family Communication:  Disposition: transfer to stepdown ICU   Consultants:  Palliative care consult  Procedures:   Antimicrobials:    Subjective: No chest pain, still with dry cough, chest congestion.   Objective: Vitals:   09/20/23 1000 09/20/23 1100 09/20/23 1323 09/20/23 1400  BP: 138/85 131/64  116/69  Pulse: 84 91    Resp: (!) 23 (!) 30 (!) 26 (!) 21  Temp:      TempSrc:      SpO2:  100% 96%   Weight:  Height:        Intake/Output Summary (Last 24 hours) at 09/20/2023 1535 Last data filed at 09/20/2023 0600 Gross per 24 hour  Intake 450 ml  Output 1750 ml  Net -1300 ml   Filed Weights   09/17/23 2111 09/18/23 1115  Weight: 54.3 kg 55.4 kg   Examination:  General exam: pt appears  acutely and chronically ill; moderately distress; emaciated/cachectic Respiratory system: poor air movement.  Severe increased work of breathing.   Cardiovascular system: Tachypneic, normal S1 & S2 heard. No JVD, murmurs, rubs, gallops or clicks. No pedal edema. Gastrointestinal system: Abdomen is nondistended, soft and nontender. No organomegaly or masses felt. Normal bowel sounds heard. Central nervous system: Alert and oriented to person, place. No focal neurological deficits. Extremities: Symmetric 5 x 5 power. Skin: No rashes, lesions or ulcers. Psychiatry: Judgement and insight appear poor. Mood & affect UTD.   Data Reviewed: I have personally reviewed following labs and imaging studies  CBC: Recent Labs  Lab 09/17/23 2125 09/18/23 0554 09/19/23 0556  WBC 6.8 7.1 6.0  HGB 8.4* 8.1* 8.0*  HCT 27.2* 27.1* 27.0*  MCV 81.4 82.1 81.6  PLT 510* 546* 557*    Basic Metabolic Panel: Recent Labs  Lab 09/17/23 2125 09/18/23 0554 09/19/23 0556 09/20/23 0352  NA 141 143 143 141  K 4.1 4.6 5.0 4.5  CL 96* 98 96* 96*  CO2 34* 35* 38* 37*  GLUCOSE 118* 152* 125* 142*  BUN 10 12 18 19   CREATININE 0.35* 0.39* 0.38* 0.45  CALCIUM  8.8* 8.6* 8.9 8.9    CBG: No results for input(s): "GLUCAP" in the last 168 hours.  Recent Results (from the past 240 hours)  MRSA Next Gen by PCR, Nasal     Status: None   Collection Time: 09/18/23 11:07 AM   Specimen: Nasal Mucosa; Nasal Swab  Result Value Ref Range Status   MRSA by PCR Next Gen NOT DETECTED NOT DETECTED Final    Comment: (NOTE) The GeneXpert MRSA Assay (FDA approved for NASAL specimens only), is one component of a comprehensive MRSA colonization surveillance program. It is not intended to diagnose MRSA infection nor to guide or monitor treatment for MRSA infections. Test performance is not FDA approved in patients less than 72 years old. Performed at [facility], [address], [place], [place] [address]      Radiology  Studies: No results found.   Scheduled Meds:  azithromycin   500 mg Oral QHS   bisoprolol   2.5 mg Oral Daily   budesonide -glycopyrrolate -formoterol   2 puff Inhalation BID   Chlorhexidine  Gluconate Cloth  6 each Topical Q0600   enoxaparin  (LOVENOX ) injection  40 mg Subcutaneous Q24H   furosemide   20 mg Intravenous Daily   ipratropium-albuterol   3 mL Nebulization Q6H   methylPREDNISolone  (SOLU-MEDROL ) injection  60 mg Intravenous Q12H   pantoprazole   40 mg Oral Q supper   tamsulosin   0.4 mg Oral QPC supper   traZODone   50 mg Oral QHS   Continuous Infusions:   LOS: 3 days   Time spent: 53 mins   Takiyah Bohnsack Lincoln Renshaw, MD How to contact the [facility] Attending or Consulting provider 7A - 7P or covering provider during after hours 7P -7A, for this patient?  Check the care team in [facility] and look for a) attending/consulting TR[facility] provider listed and b) the [facility] team listed Log into [email or web address] to find provider on call.  Locate the [facility] provider you are looking for under [facility] and page to a  number that you can be directly reached. If you still have difficulty reaching the provider, please page the [facility] (Director on Call) for the Hospitalists listed on amion for assistance.  09/20/2023, 3:35 PM

## 2023-09-21 DIAGNOSIS — I5032 Chronic diastolic (congestive) heart failure: Secondary | ICD-10-CM | POA: Diagnosis not present

## 2023-09-21 DIAGNOSIS — J9621 Acute and chronic respiratory failure with hypoxia: Secondary | ICD-10-CM | POA: Diagnosis not present

## 2023-09-21 DIAGNOSIS — Z7189 Other specified counseling: Secondary | ICD-10-CM | POA: Diagnosis not present

## 2023-09-21 DIAGNOSIS — Z515 Encounter for palliative care: Secondary | ICD-10-CM | POA: Diagnosis not present

## 2023-09-21 DIAGNOSIS — J441 Chronic obstructive pulmonary disease with (acute) exacerbation: Secondary | ICD-10-CM | POA: Diagnosis not present

## 2023-09-21 DIAGNOSIS — J9611 Chronic respiratory failure with hypoxia: Secondary | ICD-10-CM | POA: Diagnosis not present

## 2023-09-21 LAB — BASIC METABOLIC PANEL WITH GFR
Anion gap: 11 (ref 5–15)
BUN: 19 mg/dL (ref 8–23)
CO2: 33 mmol/L — ABNORMAL HIGH (ref 22–32)
Calcium: 8.7 mg/dL — ABNORMAL LOW (ref 8.9–10.3)
Chloride: 96 mmol/L — ABNORMAL LOW (ref 98–111)
Creatinine, Ser: 0.44 mg/dL (ref 0.44–1.00)
GFR, Estimated: >60 mL/min (ref >60)
Glucose, Bld: 151 mg/dL — ABNORMAL HIGH (ref 70–99)
Potassium: 4.4 mmol/L (ref 3.5–5.1)
Sodium: 140 mmol/L (ref 135–145)

## 2023-09-21 LAB — CBC
HCT: 28.5 % — ABNORMAL LOW (ref 36.0–46.0)
Hemoglobin: 8.5 g/dL — ABNORMAL LOW (ref 12.0–15.0)
MCH: 24.2 pg — ABNORMAL LOW (ref 26.0–34.0)
MCHC: 29.8 g/dL — ABNORMAL LOW (ref 30.0–36.0)
MCV: 81.2 fL (ref 80.0–100.0)
Platelets: 649 10*3/uL — ABNORMAL HIGH (ref 150–400)
RBC: 3.51 MIL/uL — ABNORMAL LOW (ref 3.87–5.11)
RDW: 21.2 % — ABNORMAL HIGH (ref 11.5–15.5)
WBC: 7.2 10*3/uL (ref 4.0–10.5)
nRBC: 0 % (ref 0.0–0.2)

## 2023-09-21 LAB — MAGNESIUM: Magnesium: 2.1 mg/dL (ref 1.7–2.4)

## 2023-09-21 MED ORDER — FUROSEMIDE 20 MG PO TABS
20.0000 mg | ORAL_TABLET | Freq: Every day | ORAL | Status: DC
  Administered 2023-09-22: 20 mg via ORAL
  Filled 2023-09-21: qty 1

## 2023-09-21 MED ORDER — ALPRAZOLAM 0.25 MG PO TABS
0.2500 mg | ORAL_TABLET | Freq: Three times a day (TID) | ORAL | Status: DC | PRN
  Filled 2023-09-21: qty 1

## 2023-09-21 NOTE — Progress Notes (Signed)
 Mobility Specialist Progress Note:    09/21/23 1522  Mobility  Activity Ambulated with assistance in hallway  Level of Assistance Contact guard assist, steadying assist  Assistive Device Front wheel walker  Distance Ambulated (ft) 35 ft  Range of Motion/Exercises Active;All extremities  Activity Response Tolerated well  Mobility Referral Yes  Mobility visit 1 Mobility  Mobility Specialist Start Time (ACUTE ONLY) 1450  Mobility Specialist Stop Time (ACUTE ONLY) 1509  Mobility Specialist Time Calculation (min) (ACUTE ONLY) 19 min   Pt received in bed requesting assistance to ambulate. Required CGA to stand and ambulate with RW. Tolerated well, asx throughout. Returned to bed, all needs met.   Glinda Lapping Mobility Specialist Please contact via Special educational needs teacher or  Rehab office at [phone]

## 2023-09-21 NOTE — Plan of Care (Signed)

## 2023-09-21 NOTE — Progress Notes (Signed)
   09/21/23 2228  BiPAP/CPAP/SIPAP  BiPAP/CPAP/SIPAP Pt Type Adult  BiPAP/CPAP/SIPAP DREAMSTATIOND  Mask Type Full face mask  Dentures removed? No (patient does not wish to remove)  Mask Size Medium  Respiratory Rate 18 breaths/min  IPAP 10 cmH20  EPAP 5 cmH2O  Flow Rate 4 lpm  Patient Home Machine No  Patient Home Mask No  Patient Home Tubing No  Auto Titrate No  BiPAP/CPAP /SiPAP Vitals  Pulse Rate (!) 105  Resp 18  SpO2 94 %  Bilateral Breath Sounds Clear;Diminished  MEWS Score/Color  MEWS Score 1  MEWS Score Color Green

## 2023-09-21 NOTE — Consult Note (Signed)
 Consultation Note Date: 09/21/2023   Patient Name: Courtney Zuniga  DOB: 09/22/1950  MRN: [id number]  Age / Sex: 73 y.o., female  PCP: Caresse Chant, FNP Referring Physician: Rayfield Cairo, MD  Reason for Consultation: Establishing goals of care  HPI/Patient Profile: 73 y.o. female  with past medical history of COPD, asthma, coronary artery disease hx NSTEMI, depression, HTN, anemia, arthritis and lung cancer. admitted on 09/17/2023 with dyspnea.   She presented to the emergency department with 7 days of worsening dyspnea, cough, and increased sputum.  Diagnosed with COPD exacerbation acute on chronic hypoxemia and hypercapnic respiratory failure.  Also noted to have CO2 narcosis which required BiPAP therapy.  Being treated with systemic corticosteroids, aggressive bronchodilators, and antitussives.  Also on azithromycin .  PMT has been consulted to assist with goals of care conversation.  Clinical Assessment and Goals of Care:  I have reviewed medical records including EPIC notes, labs (independently reviewed) and imaging, prior hospital encounters, outpatient specialist notes, assessed the patient and met with her at the bedside to discuss diagnosis prognosis, GOC, EOL wishes, disposition and options.  I introduced Palliative Medicine as specialized medical care for people living with serious illness. It focuses on providing relief from the symptoms and stress of a serious illness. The goal is to improve quality of life for both the patient and the family.  We discussed a brief life review of the patient and then focused on their current illness.   I attempted to elicit values and goals of care important to the patient.    Medical History Review and Family/Patient Understanding:   Patient states she understands that her breathing is bad.  She states that she has had issues with recurrent pneumonia and that she can get around someone even with just a cold  and she will get pneumonia.  She is trying to work on getting her lungs better.  She also knows that she has cancer too and was recently diagnosed with anemia.  She feels health is bad. With her permission, I shared my understanding of her acute respiratory concerns in the context of her chronic and serious medical problems. Shared that we while we hope she will make significant improvements as it relates to her breathing and COPD, we are worried that her health could decline and do so rapidly.  While she seems understand this, there may be some health literacy challenges.  Social History:  Lives at home with her son.  She has a biological son and daughter and an adopted son.  Her daughter assist her with making decisions however, there appeared to be some psychosocial concerns as it relates to her relationship with her daughter and her son.  This is distressing for the patient.  She shares her son lives with her and she feels safe with him and states that he could take care of her if needed but she really wants to take care of him and make sure he (and all of her children) are cared for even after she is gone.   Functional and Nutritional State:  Prior to this hospitalization, she notes she was ambulatory independently.  Has been dependent on O2 at home for some time. She states that she is able to cook, clean, and complete all of her ADLs at home.  She states that she has to go slow and take rest breaks due to her breathing but feels she manages well.  She also endorses a good appetite and likes typical [place] foods.  Both she and her son cook.  She also enjoys ensures.  Palliative Symptoms:  Shortness of breath which appears to be stable at present.  She denies any shortness of breath currently at rest.  She does get short of breath with minimal exertion.  She also reports a congested cough.  Overall, feels respiratory symptoms improved since admission to the hospital. Hopeful for continued  improvement.   She denies any pain currently.  Has oxycodone  at home for her arthritis pain.  Advance Directives:  A detailed discussion regarding advanced directives was had.  After sharing concerns for possible worsening in her respiratory status and possibility of rapid worsening, we discussed what is important to her.  She shares that being able to go home and live independently is important for her.  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 She would not want to be dependent on others or in a facility.  We discussed the difference between aggressive medical intervention versus comfort care in light of patient's goals of care.  She is already followed by palliative care as an outpatient.  We discussed different trajectories, discussed it would be completely reasonable to transition to comfort care and hospice at home given severity of her lung disease and shared what that would look like.  We also discussed alternative of discharging with plan for further therapy and continue aggressive management of COPD.   Code Status:  DNR/DNI, CODE STATUS reviewed but not discussed.  Discussion:  Spoke at length with patient regarding next steps and anticipatory care needs.  Patient shares that she does not feel she is ready for hospice right now. She has a strong faith and expresses that she continues to hope and pray God will heal her. She does state if he takes her tonight though she is also ok with that because she puts her faith and trust in him. She has also had varied experiences with hospice some being good and others not as much.  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  She has no desire to go to a facility and her goal is to return home.  She hopes to return home with home physical and Occupational Therapy for strengthening.  She states that she has her son at home who can help her if needed.  She is also followed by  outpatient palliative and will continue to follow with them and is aware that if her health were to deteriorate or if she reaches a point where she would need hospice she could reach out to them to assist.   Discussed the importance of continued conversation with family and the medical providers regarding overall plan of care and treatment options, ensuring decisions are within the context of the patient's values and GOCs.   Questions and concerns were addressed. PMT will continue to support holistically.   PATIENT if patient was unable to speak for herself she states her daughter is to be her surrogate Management consultant.   I also spoke at length with patient's daughter Courtney Zuniga) via phone.  With her permission, I discussed her mom's overall health especially in the context of her acute on chronic COPD exacerbation in the setting of her other illnesses.  Patient's daughter understands that her respiratory disease is severe.  However, she states that she believes much of her mother's breathing difficulties are related to her housing and the presence of mold in her home.  Patient's daughter  is hopeful for her to go to a SNF while she works on applying for housing for her mother.  We discussed patient's desire to avoid going to a SNF.  She plans to follow-up with her mother and the [facility] team to further discuss.  Otherwise, she is in agreement to hold off on hospice referral at this time and continue have patient follow-up with outpatient palliative.    SUMMARY OF RECOMMENDATIONS    Discharge with therapy follow-up, she prefers home d/c with therapy follow up there and daughter wants SNF d/c.  Continue outpatient palliative follow-up Symptom management per medical team, symptoms well-controlled at present, PMT available for symptom management assistance as needed  Code Status/Advance Care Planning: DNR   Symptom Management:  Tylenol  as needed Albuterol /DuoNebs nebulizers every 2 hours as  needed Robitussin DM every 4 hours as needed Zofran  as needed Percocet every 4 hours as needed Pantoprazole  daily Senna daily as needed Trazodone  50 mg nightly   Prognosis:  Unable to determine  Discharge Planning: To Be Determined      Primary Diagnoses: Present on Admission:  COPD exacerbation (HCC)  HTN (hypertension)  Adenocarcinoma of right lung, stage 4  Depression  Iron  deficiency anemia  (HFpEF) heart failure with preserved ejection fraction (HCC)  Acute on chronic respiratory failure with hypoxia and hypercapnia (HCC)  Aortic regurgitation  Chronic respiratory failure with hypoxia (HCC)  CO2 narcosis    Physical Exam Constitutional:      General: She is not in acute distress.    Appearance: She is ill-appearing. She is not toxic-appearing.  Pulmonary:     Effort: Pulmonary effort is normal. No tachypnea or respiratory distress.  Skin:    General: Skin is warm and dry.  Neurological:     Comments: Awake, alert, answers questions appropriate      Vital Signs: BP 106/70 (BP Location: Left Arm)   Pulse (!) 105   Temp 98.6 F (37 C) (Oral)   Resp (!) 26   Ht 5\' 6"  (1.676 m)   Wt 55.4 kg   SpO2 98%   BMI 19.71 kg/m  Pain Scale: 0-10   Pain Score: 0-No pain   SpO2: SpO2: 98 % O2 Device:SpO2: 98 % O2 Flow Rate: .O2 Flow Rate (L/min): 4 L/min   Palliative Assessment/Data: 60%    Total time: I spent 90 minutes in the care of the patient today in the above activities and documenting the encounter.    Render Carrie, NP  Palliative Medicine Team Team phone # [phone]  Thank you for allowing the Palliative Medicine Team to assist in the care of this patient. Please utilize secure chat with additional questions, if there is no response within 30 minutes please call the above phone number.  Palliative Medicine Team providers are available by phone from 7am to 7pm daily and can be reached through the team cell phone.  Should this  patient require assistance outside of these hours, please call the patient's attending physician.

## 2023-09-21 NOTE — Plan of Care (Signed)
 Pt has used BSC today without help multiple times. Was initiallly confused this am but has been oriented the rest of the day. Pt adamant not wanting to go to snf. See palliative care note. Pt has eaten well. Mobility is stable.  Problem: Education: Goal: Knowledge of General Education information will improve Description: Including pain rating scale, medication(s)/side effects and non-pharmacologic comfort measures Problem: Education: Goal: Knowledge of General Education information will improve Description: Including pain rating scale, medication(s)/side effects and non-pharmacologic comfort measures Outcome: Progressing   Problem: Health Behavior/Discharge Planning: Goal: Ability to manage health-related needs will improve Outcome: Progressing   Problem: Coping: Goal: Level of anxiety will decrease Outcome: Progressing   Problem: Education: Goal: Knowledge of General Education information will improve Description: Including pain rating scale, medication(s)/side effects and non-pharmacologic comfort measures Outcome: Progressing   Problem: Health Behavior/Discharge Planning: Goal: Ability to manage health-related needs will improve Outcome: Progressing   Problem: Coping: Goal: Level of anxiety will decrease Outcome: Progressing   Problem: Education: Goal: Knowledge of General Education information will improve Description: Including pain rating scale, medication(s)/side effects and non-pharmacologic comfort measures Outcome: Progressing   Problem: Health Behavior/Discharge Planning: Goal: Ability to manage health-related needs will improve Outcome: Progressing   Problem: Coping: Goal: Level of anxiety will decrease Outcome: Progressing   Problem: Education: Goal: Knowledge of General Education information will improve Description: Including pain rating scale, medication(s)/side effects and non-pharmacologic comfort measures Outcome: Progressing   Problem: Health  Behavior/Discharge Planning: Goal: Ability to manage health-related needs will improve Outcome: Progressing   Problem: Coping: Goal: Level of anxiety will decrease Outcome: Progressing   Problem: Education: Goal: Knowledge of General Education information will improve Description: Including pain rating scale, medication(s)/side effects and non-pharmacologic comfort measures Outcome: Progressing   Problem: Health Behavior/Discharge Planning: Goal: Ability to manage health-related needs will improve Outcome: Progressing   Problem: Coping: Goal: Level of anxiety will decrease Outcome: Progressing   Problem: Education: Goal: Knowledge of General Education information will improve Description: Including pain rating scale, medication(s)/side effects and non-pharmacologic comfort measures Outcome: Progressing   Problem: Health Behavior/Discharge Planning: Goal: Ability to manage health-related needs will improve Outcome: Progressing   Problem: Coping: Goal: Level of anxiety will decrease Outcome: Progressing   Problem: Education: Goal: Knowledge of General Education information will improve Description: Including pain rating scale, medication(s)/side effects and non-pharmacologic comfort measures Outcome: Progressing   Problem: Health Behavior/Discharge Planning: Goal: Ability to manage health-related needs will improve Outcome: Progressing   Problem: Coping: Goal: Level of anxiety will decrease Outcome: Progressing   Problem: Education: Goal: Knowledge of General Education information will improve Description: Including pain rating scale, medication(s)/side effects and non-pharmacologic comfort measures Outcome: Progressing   Problem: Health Behavior/Discharge Planning: Goal: Ability to manage health-related needs will improve Outcome: Progressing   Problem: Coping: Goal: Level of anxiety will decrease Outcome: Progressing   Problem: Education: Goal: Knowledge  of General Education information will improve Description: Including pain rating scale, medication(s)/side effects and non-pharmacologic comfort measures Outcome: Progressing   Problem: Health Behavior/Discharge Planning: Goal: Ability to manage health-related needs will improve Outcome: Progressing   Problem: Coping: Goal: Level of anxiety will decrease Outcome: Progressing   Problem: Education: Goal: Knowledge of General Education information will improve Description: Including pain rating scale, medication(s)/side effects and non-pharmacologic comfort measures Outcome: Progressing   Problem: Health Behavior/Discharge Planning: Goal: Ability to manage health-related needs will improve Outcome: Progressing   Problem: Coping: Goal: Level of anxiety will decrease Outcome: Progressing   Problem: Clinical Measurements: Goal:  Diagnostic test results will improve Outcome: Not Progressing   Problem: Clinical Measurements: Goal: Diagnostic test results will improve Outcome: Not Progressing   Problem: Clinical Measurements: Goal: Diagnostic test results will improve Outcome: Not Progressing   Problem: Clinical Measurements: Goal: Diagnostic test results will improve Outcome: Not Progressing   Problem: Clinical Measurements: Goal: Ability to maintain clinical measurements within normal limits will improve Outcome: Adequate for Discharge Goal: Will remain free from infection Outcome: Adequate for Discharge Goal: Respiratory complications will improve Outcome: Adequate for Discharge Goal: Cardiovascular complication will be avoided Outcome: Adequate for Discharge   Problem: Activity: Goal: Risk for activity intolerance will decrease Outcome: Adequate for Discharge   Problem: Nutrition: Goal: Adequate nutrition will be maintained Outcome: Adequate for Discharge   Problem: Elimination: Goal: Will not experience complications related to bowel motility Outcome: Adequate  for Discharge Goal: Will not experience complications related to urinary retention Outcome: Adequate for Discharge   Problem: Pain Managment: Goal: General experience of comfort will improve and/or be controlled Outcome: Adequate for Discharge   Problem: Safety: Goal: Ability to remain free from injury will improve Outcome: Adequate for Discharge   Problem: Skin Integrity: Goal: Risk for impaired skin integrity will decrease Outcome: Adequate for Discharge   Problem: Clinical Measurements: Goal: Ability to maintain clinical measurements within normal limits will improve Outcome: Adequate for Discharge Goal: Will remain free from infection Outcome: Adequate for Discharge Goal: Respiratory complications will improve Outcome: Adequate for Discharge Goal: Cardiovascular complication will be avoided Outcome: Adequate for Discharge   Problem: Activity: Goal: Risk for activity intolerance will decrease Outcome: Adequate for Discharge   Problem: Nutrition: Goal: Adequate nutrition will be maintained Outcome: Adequate for Discharge   Problem: Elimination: Goal: Will not experience complications related to bowel motility Outcome: Adequate for Discharge Goal: Will not experience complications related to urinary retention Outcome: Adequate for Discharge   Problem: Pain Managment: Goal: General experience of comfort will improve and/or be controlled Outcome: Adequate for Discharge   Problem: Safety: Goal: Ability to remain free from injury will improve Outcome: Adequate for Discharge   Problem: Skin Integrity: Goal: Risk for impaired skin integrity will decrease Outcome: Adequate for Discharge   Problem: Clinical Measurements: Goal: Diagnostic test results will improve Outcome: Not Progressing   Problem: Education: Goal: Knowledge of General Education information will improve Description: Including pain rating scale, medication(s)/side effects and non-pharmacologic  comfort measures Outcome: Progressing   Problem: Health Behavior/Discharge Planning: Goal: Ability to manage health-related needs will improve Outcome: Progressing   Problem: Coping: Goal: Level of anxiety will decrease Outcome: Progressing   Outcome: Progressing   Problem: Health Behavior/Discharge Planning: Goal: Ability to manage health-related needs will improve Outcome: Progressing   Problem: Coping: Goal: Level of anxiety will decrease Outcome: Progressing

## 2023-09-21 NOTE — TOC Progression Note (Signed)
 Transition of Care [facility]) - Progression Note    Patient Details  Name: Courtney Zuniga MRN: [id number] Date of Birth: 08/31/1950  [facility] CM/SW Contact  Cyndie Dredge, [place] Phone Number: 09/21/2023, 1:06 PM  Clinical Narrative:     Writer checked patient insurance auth and it is still pending. Daughter updated. TOC to follow.   Expected Discharge Plan: Skilled Nursing Facility Barriers to Discharge: Insurance Authorization  Expected Discharge Plan and Services In-house Referral: Clinical Social Work   Post Acute Care Choice: Horticulturist, commercial, Home Health Living arrangements for the past 2 months: Single Family Home                       Social Determinants of Health (SDOH) Interventions SDOH Screenings   Food Insecurity: No Food Insecurity (09/18/2023)  Housing: Low Risk  (09/18/2023)  Transportation Needs: No Transportation Needs (09/18/2023)  Utilities: Not At Risk (09/18/2023)  Financial Resource Strain: Low Risk  (05/05/2018)  Physical Activity: Insufficiently Active (05/05/2018)  Social Connections: Moderately Isolated (09/18/2023)  Stress: No Stress Concern Present (05/05/2018)  Tobacco Use: Medium Risk (09/17/2023)    Readmission Risk Interventions    09/21/2023    1:05 PM 09/18/2023    1:24 PM 08/10/2023   11:03 AM  Readmission Risk Prevention Plan  Transportation Screening Complete Complete Complete  HRI or Home Care Consult Complete Complete Complete  Social Work Consult for Recovery Care Planning/Counseling Complete Complete Complete  Palliative Care Screening Not Applicable Complete Complete  Medication Review Oceanographer) Complete Complete Complete

## 2023-09-21 NOTE — Progress Notes (Signed)
 PROGRESS NOTE   Courtney Zuniga  ZOX:[id number] DOB: 11-17-1950 DOA: 09/17/2023 PCP: Caresse Chant, FNP   Chief Complaint  Patient presents with   Shortness of Breath   Level of care: Telemetry  Brief Admission History:   73 y.o. female with medical history significant of COPD, asthma, coronary artery disease, depression and lung cancer who presents with dyspnea.  Reports 7 days of worsening dyspnea, associated with cough, wheezing and increased sputum production.  Dyspnea worse with exertion, moderate to severe in intensity, associated with chills, no improving factors.  Patient has been  using her inhalers and nebulizer medications with no improvement in her symptoms.  Because severity of her symptoms she called EMS. She was found in respiratory distress, received nebulizer treatment and was transported to the ED.    Patient uses supplemental 02 at home 4 L/min at her baseline.  She has recurrent non small cell lung cancer, patient has been treated with immunotherapy, last time was in 2022.  Currently under observation.    Assessment and Plan:  COPD exacerbation  End stage COPD  Progressing chronic respiratory failure  Acute on chronic hypoxemic and hypercapnic respiratory failure.  Pt was having CO2 narcosis when first arrived, we ordered STAT bipap and transferred to stepdown ICU.   She has improved to baseline now after bipap therapy.   Continue medical therapy with systemic corticosteroids, methylprednisolone  60 mg IV q 12 hrs Aggressive bronchodilator therapy, B agonist and anticholinergic Inhaled corticosteroids. Antitussive  agents, airway clearing techniques with flutter valve and incentive spirometer. Antibiotic therapy with azithromycin .  PT and OT recommending SNF placement - TOC was consulted Pt reporting that she is breathing at her baseline.    DNRDNI Received a communication from Alva Auer NP with [facility]'s outpatient palliative care/Serious Illness Care program and  she informed me that she recently met with patient at home and patient agreed to [facility] and she completed a golden rod DNR form.  She said that she would fax it to case management.  In addition, she felt that patient qualifies now for home hospice program. Updated to DNR. She recommended inpatient palliative care consultation which has been requested  Adult Failure to thrive  Palliative care consult requested   CO2 Narcosis - improved with bipap therapy  - pt says she is using her home NIV nightly but not during daytime - continue bipap Q.H.S  Iron  deficiency anemia Hg has been stable.   Depression Continue with trazodone , and duloxetine  Continue pain control with oxycodone .   Adenocarcinoma of right lung, stage 4 Currently under observation.  Old records personally reviewed.  Follow up as outpatient with oncology  Palliative medicine consult for adult failure to thrive   HTN (hypertension) Patient not taking bisoprolol .  Continue blood pressure monitoring   Chronic HFpEF IV lasix  20 mg daily given thru 6/9 Plan to start oral lasix  20 mg daily starting 6/10   GERD Pantoprazole  for GI protection   DVT prophylaxis: enoxaparin  Code Status: Full  Family Communication:  Disposition: SNF placement when bed available per TOC no insurance auth yet    Consultants:  Palliative care consult  Procedures:   Antimicrobials:    Subjective: Pt says she feels like she is breathing closer to her baseline today.     Objective: Vitals:   09/21/23 0310 09/21/23 0800 09/21/23 0803 09/21/23 1003  BP: 137/66   106/70  Pulse: 98   (!) 105  Resp: (!) 26     Temp: 99.1 F (37.3 C)  98.6 F (37 C)  TempSrc: Oral   Oral  SpO2: 99% 100% 100% 98%  Weight:      Height:        Intake/Output Summary (Last 24 hours) at 09/21/2023 1026 Last data filed at 09/21/2023 0443 Gross per 24 hour  Intake 600 ml  Output --  Net 600 ml   Filed Weights   09/17/23 2111 09/18/23 1115  Weight:  54.3 kg 55.4 kg   Examination:  General exam: pt appears acutely and chronically ill; moderately distress; emaciated/cachectic Respiratory system: poor air movement.  Severe increased work of breathing.   Cardiovascular system: Tachypneic, normal S1 & S2 heard. No JVD, murmurs, rubs, gallops or clicks. No pedal edema. Gastrointestinal system: Abdomen is nondistended, soft and nontender. No organomegaly or masses felt. Normal bowel sounds heard. Central nervous system: Alert and oriented to person, place. No focal neurological deficits. Extremities: Symmetric 5 x 5 power. Skin: No rashes, lesions or ulcers. Psychiatry: Judgement and insight appear poor. Mood & affect UTD.   Data Reviewed: I have personally reviewed following labs and imaging studies  CBC: Recent Labs  Lab 09/17/23 2125 09/18/23 0554 09/19/23 0556 09/21/23 0444  WBC 6.8 7.1 6.0 7.2  HGB 8.4* 8.1* 8.0* 8.5*  HCT 27.2* 27.1* 27.0* 28.5*  MCV 81.4 82.1 81.6 81.2  PLT 510* 546* 557* 649*    Basic Metabolic Panel: Recent Labs  Lab 09/17/23 2125 09/18/23 0554 09/19/23 0556 09/20/23 0352 09/21/23 0444  NA 141 143 143 141 140  K 4.1 4.6 5.0 4.5 4.4  CL 96* 98 96* 96* 96*  CO2 34* 35* 38* 37* 33*  GLUCOSE 118* 152* 125* 142* 151*  BUN 10 12 18 19 19   CREATININE 0.35* 0.39* 0.38* 0.45 0.44  CALCIUM  8.8* 8.6* 8.9 8.9 8.7*  MG  --   --   --   --  2.1    CBG: No results for input(s): "GLUCAP" in the last 168 hours.  Recent Results (from the past 240 hours)  MRSA Next Gen by PCR, Nasal     Status: None   Collection Time: 09/18/23 11:07 AM   Specimen: Nasal Mucosa; Nasal Swab  Result Value Ref Range Status   MRSA by PCR Next Gen NOT DETECTED NOT DETECTED Final    Comment: (NOTE) The GeneXpert MRSA Assay (FDA approved for NASAL specimens only), is one component of a comprehensive MRSA colonization surveillance program. It is not intended to diagnose MRSA infection nor to guide or monitor treatment for MRSA  infections. Test performance is not FDA approved in patients less than 34 years old. Performed at [facility], [address], [place], [place] [address]      Radiology Studies: No results found.  Scheduled Meds:  azithromycin   500 mg Oral QHS   bisoprolol   2.5 mg Oral Daily   budesonide -glycopyrrolate -formoterol   2 puff Inhalation BID   Chlorhexidine  Gluconate Cloth  6 each Topical Q0600   enoxaparin  (LOVENOX ) injection  40 mg Subcutaneous Q24H   furosemide   20 mg Intravenous Daily   ipratropium-albuterol   3 mL Nebulization TID   methylPREDNISolone  (SOLU-MEDROL ) injection  60 mg Intravenous Q12H   pantoprazole   40 mg Oral Q supper   tamsulosin   0.4 mg Oral QPC supper   traZODone   50 mg Oral QHS   Continuous Infusions:   LOS: 4 days   Time spent: 50 mins  Dabney Dever Lincoln Renshaw, MD How to contact the [facility] Attending or Consulting provider 7A - 7P or covering provider during  after hours 7P -7A, for this patient?  Check the care team in [facility] and look for a) attending/consulting TRH provider listed and b) the TRH team listed Log into [email or web address] to find provider on call.  Locate the TRH provider you are looking for under [facility] and page to a number that you can be directly reached. If you still have difficulty reaching the provider, please page the [facility] (Director on Call) for the Hospitalists listed on amion for assistance.  09/21/2023, 10:26 AM

## 2023-09-22 DIAGNOSIS — C3491 Malignant neoplasm of unspecified part of right bronchus or lung: Secondary | ICD-10-CM | POA: Diagnosis not present

## 2023-09-22 DIAGNOSIS — J9611 Chronic respiratory failure with hypoxia: Secondary | ICD-10-CM | POA: Diagnosis not present

## 2023-09-22 DIAGNOSIS — J9621 Acute and chronic respiratory failure with hypoxia: Secondary | ICD-10-CM | POA: Diagnosis not present

## 2023-09-22 DIAGNOSIS — J441 Chronic obstructive pulmonary disease with (acute) exacerbation: Secondary | ICD-10-CM | POA: Diagnosis not present

## 2023-09-22 MED ORDER — BISOPROLOL FUMARATE 5 MG PO TABS: 2.5000 mg | ORAL_TABLET | Freq: Every day | ORAL | 0 refills | Status: AC

## 2023-09-22 MED ORDER — PREDNISONE 20 MG PO TABS: ORAL_TABLET | ORAL | 0 refills | Status: DC

## 2023-09-22 MED ORDER — FUROSEMIDE 20 MG PO TABS: 20.0000 mg | ORAL_TABLET | ORAL | 1 refills | Status: AC

## 2023-09-22 MED ORDER — GUAIFENESIN-DM 100-10 MG/5ML PO SYRP: 5.0000 mL | ORAL_SOLUTION | ORAL | 0 refills | Status: DC | PRN

## 2023-09-22 NOTE — Discharge Summary (Signed)
 Physician Discharge Summary  Courtney Zuniga ION:[id number] DOB: 19-Apr-1950 DOA: 09/17/2023  PCP: Caresse Chant, FNP  Admit date: 09/17/2023 Discharge date: 09/22/2023  Admitted From:  Home  Disposition:  Home with HH (Pt REFUSING SNF PLACEMENT AND HOME HOSPICE)  Recommendations for Outpatient Follow-up:  Follow up with PCP in 1 weeks  Home Health: PT, RN   Discharge Condition: GUARDED  CODE STATUS: DNR DIET: regular foods as tolerated    Brief Hospitalization Summary: Please see all hospital notes, images, labs for full details of the hospitalization. Admission provider HPI:   73 y.o. female with medical history significant of COPD, asthma, coronary artery disease, depression and lung cancer who presents with dyspnea.  Reports 7 days of worsening dyspnea, associated with cough, wheezing and increased sputum production.  Dyspnea worse with exertion, moderate to severe in intensity, associated with chills, no improving factors.  Patient has been  using her inhalers and nebulizer medications with no improvement in her symptoms.  Because severity of her symptoms she called EMS. She was found in respiratory distress, received nebulizer treatment and was transported to the ED.    Patient uses supplemental 02 at home 4 L/min at her baseline.  She has recurrent non small cell lung cancer, patient has been treated with immunotherapy, last time was in 2022.  Currently under observation.   Hospital Course by listed problems addressed  COPD exacerbation  End stage COPD  Progressing chronic respiratory failure  Acute on chronic hypoxemic and hypercapnic respiratory failure.  Pt was having CO2 narcosis when first arrived, we ordered STAT bipap and transferred to stepdown ICU.   She has improved to baseline now after bipap therapy.   Continue medical therapy with systemic corticosteroids, methylprednisolone  60 mg IV q 12 hrs Aggressive bronchodilator therapy, B agonist and anticholinergic Inhaled  corticosteroids. Antitussive  agents, airway clearing techniques with flutter valve and incentive spirometer. Antibiotic therapy with azithromycin .  PT and OT recommending SNF placement - TOC was consulted Pt reporting that she is breathing at her baseline. PT REFUSING SNF AND REFUSING HOME HOSPICE CARE AT THIS TIME   DC HOME    DNRDNI Received a communication from Alva Auer NP with [facility]'s outpatient palliative care/Serious Illness Care program and she informed me that she recently met with patient at home and patient agreed to [facility] and she completed a golden rod DNR form.  She said that she would fax it to case management.  In addition, she felt that patient qualifies now for home hospice program. Updated to DNR. She recommended inpatient palliative care consultation which has been requested   Adult Failure to thrive  Palliative care consult requested    CO2 Narcosis - RESOLVED  - improved with bipap therapy  - pt says she is using her home NIV nightly but not during daytime - continue bipap Q.H.S   Iron  deficiency anemia Hg has been stable.    Depression Continue with trazodone , and duloxetine  Continue pain control with oxycodone .    Adenocarcinoma of right lung, stage 4 Currently under observation.  Old records personally reviewed.  Follow up as outpatient with oncology  Palliative medicine consult for adult failure to thrive    HTN (hypertension) Patient not taking bisoprolol .  Continue blood pressure monitoring    Chronic HFpEF IV lasix  20 mg daily given thru 6/9 Lasix  20 mg MWF    GERD Pantoprazole  for GI protection   Discharge Diagnoses:  Principal Problem:   COPD exacerbation (HCC) Active Problems:   CO2 narcosis  HTN (hypertension)   Adenocarcinoma of right lung, stage 4   Depression   Chronic respiratory failure with hypoxia (HCC)   Acute on chronic respiratory failure with hypoxia and hypercapnia (HCC)   Iron  deficiency anemia   (HFpEF) heart  failure with preserved ejection fraction [facility])   Aortic regurgitation   Aortic stenosis   Discharge Instructions:  Allergies as of 09/22/2023       Reactions   Desyrel  [trazodone ] Other (See Comments)   "Made me faint"        Medication List     STOP taking these medications    cyclobenzaprine  10 MG tablet Commonly known as: FLEXERIL    DULoxetine  60 MG capsule Commonly known as: CYMBALTA        TAKE these medications    albuterol  108 (90 Base) MCG/ACT inhaler Commonly known as: VENTOLIN  HFA Inhale 2 puffs into the lungs every 4 (four) hours as needed for shortness of breath.   albuterol  (2.5 MG/3ML) 0.083% nebulizer solution Commonly known as: PROVENTIL  Take 3 mLs (2.5 mg total) by nebulization 3 (three) times daily as needed for shortness of breath or wheezing.   BC HEADACHE POWDER PO Take 1 packet by mouth as needed (headache).   benzonatate 100 MG capsule Commonly known as: TESSALON Take 100 mg by mouth 3 (three) times daily as needed for cough.   bisoprolol  5 MG tablet Commonly known as: ZEBETA  Take 0.5 tablets (2.5 mg total) by mouth daily.   Combivent  Respimat 20-100 MCG/ACT Aers respimat Generic drug: Ipratropium-Albuterol  Inhale 1 puff into the lungs every 6 (six) hours as needed for wheezing or shortness of breath.   docusate sodium  100 MG capsule Commonly known as: Colace Take 1 capsule (100 mg total) by mouth 2 (two) times daily.   Ensure Take 237 mLs by mouth 3 (three) times daily between meals.   ferrous sulfate  325 (65 FE) MG tablet Take 1 tablet (325 mg total) by mouth daily with breakfast.   furosemide  20 MG tablet Commonly known as: LASIX  Take 1 tablet (20 mg total) by mouth every Monday, Wednesday, and Friday. Start taking on: September 23, 2023   guaiFENesin -dextromethorphan  100-10 MG/5ML syrup Commonly known as: ROBITUSSIN DM Take 5 mLs by mouth every 4 (four) hours as needed for cough.   oxyCODONE -acetaminophen  5-325 MG  tablet Commonly known as: PERCOCET/ROXICET Take 1 tablet by mouth every 8 (eight) hours as needed for moderate pain.   pantoprazole  40 MG tablet Commonly known as: PROTONIX  Take 1 tablet (40 mg total) by mouth 2 (two) times daily.   predniSONE  20 MG tablet Commonly known as: DELTASONE  Take 3 PO QAM x5days, 2 PO QAM x5days, 1 PO QAM x5days Start taking on: September 23, 2023   traZODone  50 MG tablet Commonly known as: DESYREL  Take 1 tablet (50 mg total) by mouth at bedtime.   Trelegy Ellipta  100-62.5-25 MCG/ACT Aepb Generic drug: Fluticasone -Umeclidin-Vilant INHALE 1 PUFF BY MOUTH EVERY DAY        Contact information for follow-up providers     Caresse Chant, FNP. Schedule an appointment as soon as possible for a visit in 1 week(s).   Specialty: Family Medicine Why: Hospital Follow Up Contact information: [address] [place] [address] [phone]              Contact information for after-discharge care     Destination     [facility] .   Service: Skilled Nursing Contact information: [address] [place]  [address] [phone]  Allergies  Allergen Reactions   Desyrel  [Trazodone ] Other (See Comments)    "Made me faint"   Allergies as of 09/22/2023       Reactions   Desyrel  [trazodone ] Other (See Comments)   "Made me faint"        Medication List     STOP taking these medications    cyclobenzaprine  10 MG tablet Commonly known as: FLEXERIL    DULoxetine  60 MG capsule Commonly known as: CYMBALTA        TAKE these medications    albuterol  108 (90 Base) MCG/ACT inhaler Commonly known as: VENTOLIN  HFA Inhale 2 puffs into the lungs every 4 (four) hours as needed for shortness of breath.   albuterol  (2.5 MG/3ML) 0.083% nebulizer solution Commonly known as: PROVENTIL  Take 3 mLs (2.5 mg total) by nebulization 3 (three) times daily as needed for shortness of breath or  wheezing.   BC HEADACHE POWDER PO Take 1 packet by mouth as needed (headache).   benzonatate 100 MG capsule Commonly known as: TESSALON Take 100 mg by mouth 3 (three) times daily as needed for cough.   bisoprolol  5 MG tablet Commonly known as: ZEBETA  Take 0.5 tablets (2.5 mg total) by mouth daily.   Combivent  Respimat 20-100 MCG/ACT Aers respimat Generic drug: Ipratropium-Albuterol  Inhale 1 puff into the lungs every 6 (six) hours as needed for wheezing or shortness of breath.   docusate sodium  100 MG capsule Commonly known as: Colace Take 1 capsule (100 mg total) by mouth 2 (two) times daily.   Ensure Take 237 mLs by mouth 3 (three) times daily between meals.   ferrous sulfate  325 (65 FE) MG tablet Take 1 tablet (325 mg total) by mouth daily with breakfast.   furosemide  20 MG tablet Commonly known as: LASIX  Take 1 tablet (20 mg total) by mouth every Monday, Wednesday, and Friday. Start taking on: September 23, 2023   guaiFENesin -dextromethorphan  100-10 MG/5ML syrup Commonly known as: ROBITUSSIN DM Take 5 mLs by mouth every 4 (four) hours as needed for cough.   oxyCODONE -acetaminophen  5-325 MG tablet Commonly known as: PERCOCET/ROXICET Take 1 tablet by mouth every 8 (eight) hours as needed for moderate pain.   pantoprazole  40 MG tablet Commonly known as: PROTONIX  Take 1 tablet (40 mg total) by mouth 2 (two) times daily.   predniSONE  20 MG tablet Commonly known as: DELTASONE  Take 3 PO QAM x5days, 2 PO QAM x5days, 1 PO QAM x5days Start taking on: September 23, 2023   traZODone  50 MG tablet Commonly known as: DESYREL  Take 1 tablet (50 mg total) by mouth at bedtime.   Trelegy Ellipta  100-62.5-25 MCG/ACT Aepb Generic drug: Fluticasone -Umeclidin-Vilant INHALE 1 PUFF BY MOUTH EVERY DAY        Procedures/Studies: DG Chest 2 View Result Date: 09/17/2023 CLINICAL DATA:  Dyspnea, lethargy, cough EXAM: CHEST - 2 VIEW COMPARISON:  08/09/2023 FINDINGS: Lungs are hyperinflated in  keeping with changes of underlying COPD. Small right pleural effusion has developed. There is developing focal infiltrate within the basilar right upper lobe as well as patchy infiltrate within the right apex in keeping with changes of multifocal infection in the appropriate clinical setting. No pneumothorax. Cardiac size within normal limits. Right internal jugular chest port tip seen within the superior vena cava. No acute bone abnormality. IMPRESSION: 1. Developing multifocal pulmonary infiltrate, in keeping with changes of multifocal infection in the appropriate clinical setting. Small right pleural effusion. 2. COPD. Electronically Signed   By: Worthy Heads M.D.   On: 09/17/2023 22:04  Subjective: Pt says she feels well to go home, she ambulated the halls well and she is refusing to go to SNF, says she will go home only.   Discharge Exam: Vitals:   09/22/23 0750 09/22/23 1002  BP:  103/64  Pulse:  90  Resp:    Temp:    SpO2: 99% 100%   Vitals:   09/22/23 0558 09/22/23 0745 09/22/23 0750 09/22/23 1002  BP: 109/63   103/64  Pulse: 88   90  Resp: (!) 26     Temp: 98.6 F (37 C)     TempSrc: Oral     SpO2: 100% 99% 99% 100%  Weight:      Height:       General: Pt is frail, cachectic, alert, awake, not in acute distress Cardiovascular: normal S1/S2 +, no rubs, no gallops Respiratory: poor air movement bilateral (chronic), no increased work of breathing.  Abdominal: Soft, NT, ND, bowel sounds + Extremities: trace bilateral pretibial edema, no cyanosis   The results of significant diagnostics from this hospitalization (including imaging, microbiology, ancillary and laboratory) are listed below for reference.     Microbiology: Recent Results (from the past 240 hours)  MRSA Next Gen by PCR, Nasal     Status: None   Collection Time: 09/18/23 11:07 AM   Specimen: Nasal Mucosa; Nasal Swab  Result Value Ref Range Status   MRSA by PCR Next Gen NOT DETECTED NOT DETECTED Final     Comment: (NOTE) The GeneXpert MRSA Assay (FDA approved for NASAL specimens only), is one component of a comprehensive MRSA colonization surveillance program. It is not intended to diagnose MRSA infection nor to guide or monitor treatment for MRSA infections. Test performance is not FDA approved in patients less than 34 years old. Performed at [facility], [address] [place], [place] [address]      Labs: BNP (last 3 results) Recent Labs    05/28/23 0908 08/06/23 1103 08/10/23 0444  BNP 100.0 111.2* 354.0*   Basic Metabolic Panel: Recent Labs  Lab 09/17/23 2125 09/18/23 0554 09/19/23 0556 09/20/23 0352 09/21/23 0444  NA 141 143 143 141 140  K 4.1 4.6 5.0 4.5 4.4  CL 96* 98 96* 96* 96*  CO2 34* 35* 38* 37* 33*  GLUCOSE 118* 152* 125* 142* 151*  BUN 10 12 18 19 19   CREATININE 0.35* 0.39* 0.38* 0.45 0.44  CALCIUM  8.8* 8.6* 8.9 8.9 8.7*  MG  --   --   --   --  2.1   Liver Function Tests: No results for input(s): "AST", "ALT", "ALKPHOS", "BILITOT", "PROT", "ALBUMIN " in the last 168 hours. No results for input(s): "LIPASE", "AMYLASE" in the last 168 hours. No results for input(s): "AMMONIA" in the last 168 hours. CBC: Recent Labs  Lab 09/17/23 2125 09/18/23 0554 09/19/23 0556 09/21/23 0444  WBC 6.8 7.1 6.0 7.2  HGB 8.4* 8.1* 8.0* 8.5*  HCT 27.2* 27.1* 27.0* 28.5*  MCV 81.4 82.1 81.6 81.2  PLT 510* 546* 557* 649*   Cardiac Enzymes: No results for input(s): "CKTOTAL", "CKMB", "CKMBINDEX", "TROPONINI" in the last 168 hours. BNP: Invalid input(s): "POCBNP" CBG: No results for input(s): "GLUCAP" in the last 168 hours. D-Dimer No results for input(s): "DDIMER" in the last 72 hours. Hgb A1c No results for input(s): "HGBA1C" in the last 72 hours. Lipid Profile No results for input(s): "CHOL", "HDL", "LDLCALC", "TRIG", "CHOLHDL", "LDLDIRECT" in the last 72 hours. Thyroid  function studies No results for input(s): "TSH", "T4TOTAL", "T3FREE", "THYROIDAB" in  the  last 72 hours.  Invalid input(s): "FREET3" Anemia work up No results for input(s): "VITAMINB12", "FOLATE", "FERRITIN", "TIBC", "IRON ", "RETICCTPCT" in the last 72 hours. Urinalysis    Component Value Date/Time   COLORURINE YELLOW 07/16/2022 1928   APPEARANCEUR CLEAR 07/16/2022 1928   LABSPEC 1.014 07/16/2022 1928   PHURINE 5.0 07/16/2022 1928   GLUCOSEU NEGATIVE 07/16/2022 1928   HGBUR NEGATIVE 07/16/2022 1928   BILIRUBINUR NEGATIVE 07/16/2022 1928   KETONESUR NEGATIVE 07/16/2022 1928   PROTEINUR NEGATIVE 07/16/2022 1928   UROBILINOGEN 0.2 03/31/2014 1406   NITRITE NEGATIVE 07/16/2022 1928   LEUKOCYTESUR NEGATIVE 07/16/2022 1928   Sepsis Labs Recent Labs  Lab 09/17/23 2125 09/18/23 0554 09/19/23 0556 09/21/23 0444  WBC 6.8 7.1 6.0 7.2   Microbiology Recent Results (from the past 240 hours)  MRSA Next Gen by PCR, Nasal     Status: None   Collection Time: 09/18/23 11:07 AM   Specimen: Nasal Mucosa; Nasal Swab  Result Value Ref Range Status   MRSA by PCR Next Gen NOT DETECTED NOT DETECTED Final    Comment: (NOTE) The GeneXpert MRSA Assay (FDA approved for NASAL specimens only), is one component of a comprehensive MRSA colonization surveillance program. It is not intended to diagnose MRSA infection nor to guide or monitor treatment for MRSA infections. Test performance is not FDA approved in patients less than 77 years old. Performed at [facility], [address] [place], [place] [address]    Time coordinating discharge: 38 mins   SIGNED:  Faustino Hook, MD  [facility] 09/22/2023, 10:32 AM How to contact the [facility] Attending or Consulting provider 7A - 7P or covering provider during after hours 7P -7A, for this patient?  Check the care team in [facility] and look for a) attending/consulting TRH provider listed and b) the TRH team listed Log into [email or web address] and use [place]'s universal password to access. If you do not have the password, please contact the  hospital operator. Locate the TRH provider you are looking for under [facility] and page to a number that you can be directly reached. If you still have difficulty reaching the provider, please page the [facility] (Director on Call) for the Hospitalists listed on amion for assistance.

## 2023-09-22 NOTE — Care Management Important Message (Signed)
 Important Message  Patient Details  Name: Courtney Zuniga MRN: [id number] Date of Birth: [birth year]   Important Message Given:  Yes - Medicare IM     [name] 09/22/2023, 12:36 PM

## 2023-09-22 NOTE — TOC Transition Note (Signed)
 Transition of Care [facility]) - Discharge Note   Patient Details  Name: Courtney Zuniga MRN: [id number] Date of Birth: 1950/12/14  Transition [facility]) CM/SW Contact:  Cyndie Dredge, LCSWA Phone Number: 09/22/2023, 12:00 PM   Clinical Narrative:     Patient is discharging today . Patient declined going to SNF. Writer spoke with patient and she confirmed wanting to go home. Afterwards, Clinical research associate spoke with daughter and daughter stated that she cannot stress it if her mom does not want to go.Reached out to numerous facility . [facility] still pending on if they can accept. Writer did explain to patient the denials from other agencies. Writer asked about OPPT and patient stated that she did not have a ride. TOC signing off.   Final next level of care: Home w Home Health Services Barriers to Discharge: Barriers Resolved   Patient Goals and CMS Choice Patient states their goals for this hospitalization and ongoing recovery are:: DC home CMS Medicare.gov Compare Post Acute Care list provided to:: Patient Choice offered to / list presented to : Patient      Discharge Placement              Patient chooses bed at:  (Patient refused SNF and wants to go home with HHPT) Patient to be transferred to facility by: Family Name of family member notified: [name] - daughter Patient and family notified of of transfer: 09/22/23  Discharge Plan and Services Additional resources added to the After Visit Summary for   In-house Referral: Clinical Social Work   Post Acute Care Choice: Horticulturist, commercial, Home Health          DME Arranged: N/A         HH Arranged: Refused SNF, PT [facility] Agency: [facility] Date [facility] Agency Contacted: 09/22/23 Time HH Agency Contacted: [id number] Representative spoke with at [facility] Agency: Gwinda Leopard  Social Drivers of Health (SDOH) Interventions SDOH Screenings   Food Insecurity: No Food Insecurity (09/18/2023)  Housing: Low Risk  (09/18/2023)  Transportation  Needs: No Transportation Needs (09/18/2023)  Utilities: Not At Risk (09/18/2023)  Financial Resource Strain: Low Risk  (05/05/2018)  Physical Activity: Insufficiently Active (05/05/2018)  Social Connections: Moderately Isolated (09/18/2023)  Stress: No Stress Concern Present (05/05/2018)  Tobacco Use: Medium Risk (09/17/2023)     Readmission Risk Interventions    09/22/2023   10:59 AM 09/21/2023    1:05 PM 09/18/2023    1:24 PM  Readmission Risk Prevention Plan  Transportation Screening Complete Complete Complete  HRI or Home Care Consult Complete Complete Complete  Social Work Consult for Recovery Care Planning/Counseling Complete Complete Complete  Palliative Care Screening Patient Refused Not Applicable Complete  Medication Review Oceanographer) Complete Complete Complete

## 2023-09-22 NOTE — Plan of Care (Signed)

## 2023-09-22 NOTE — Progress Notes (Signed)
 Went over discharge instructions w/ pt.

## 2023-09-22 NOTE — Plan of Care (Signed)

## 2023-09-22 NOTE — Discharge Instructions (Signed)
IMPORTANT INFORMATION: PAY CLOSE ATTENTION  ? ?PHYSICIAN DISCHARGE INSTRUCTIONS ? ?Follow with Primary care provider  Adams, Erica W, FNP  and other consultants as instructed by your Hospitalist Physician ? ?SEEK MEDICAL CARE OR RETURN TO EMERGENCY ROOM IF SYMPTOMS COME BACK, WORSEN OR NEW PROBLEM DEVELOPS  ? ?Please note: ?You were cared for by a hospitalist during your hospital stay. Every effort will be made to forward records to your primary care provider.  You can request that your primary care provider send for your hospital records if they have not received them.  Once you are discharged, your primary care physician will handle any further medical issues. Please note that NO REFILLS for any discharge medications will be authorized once you are discharged, as it is imperative that you return to your primary care physician (or establish a relationship with a primary care physician if you do not have one) for your post hospital discharge needs so that they can reassess your need for medications and monitor your lab values. ? ?Please get a complete blood count and chemistry panel checked by your Primary MD at your next visit, and again as instructed by your Primary MD. ? ?Get Medicines reviewed and adjusted: ?Please take all your medications with you for your next visit with your Primary MD ? ?Laboratory/radiological data: ?Please request your Primary MD to go over all hospital tests and procedure/radiological results at the follow up, please ask your primary care provider to get all Hospital records sent to his/her office. ? ?In some cases, they will be blood work, cultures and biopsy results pending at the time of your discharge. Please request that your primary care provider follow up on these results. ? ?If you are diabetic, please bring your blood sugar readings with you to your follow up appointment with primary care.   ? ?Please call and make your follow up appointments as soon as possible.   ? ?Also Note  the following: ?If you experience worsening of your admission symptoms, develop shortness of breath, life threatening emergency, suicidal or homicidal thoughts you must seek medical attention immediately by calling 911 or calling your MD immediately  if symptoms less severe. ? ?You must read complete instructions/literature along with all the possible adverse reactions/side effects for all the Medicines you take and that have been prescribed to you. Take any new Medicines after you have completely understood and accpet all the possible adverse reactions/side effects.  ? ?Do not drive when taking Pain medications or sleeping medications (Benzodiazepines) ? ?Do not take more than prescribed Pain, Sleep and Anxiety Medications. It is not advisable to combine anxiety,sleep and pain medications without talking with your primary care practitioner ? ?Special Instructions: If you have smoked or chewed Tobacco  in the last 2 yrs please stop smoking, stop any regular Alcohol  and or any Recreational drug use. ? ?Wear Seat belts while driving.  Do not drive if taking any narcotic, mind altering or controlled substances or recreational drugs or alcohol.  ? ? ? ? ? ?

## 2023-09-23 ENCOUNTER — Ambulatory Visit

## 2023-10-05 ENCOUNTER — Telehealth: Payer: Self-pay | Admitting: Internal Medicine

## 2023-10-05 NOTE — Telephone Encounter (Signed)
Rescheduled appointment with the patient.

## 2023-10-07 ENCOUNTER — Inpatient Hospital Stay: Attending: Internal Medicine

## 2023-10-09 ENCOUNTER — Inpatient Hospital Stay: Attending: Internal Medicine

## 2023-10-09 DIAGNOSIS — K5520 Angiodysplasia of colon without hemorrhage: Secondary | ICD-10-CM

## 2023-10-09 DIAGNOSIS — Z85118 Personal history of other malignant neoplasm of bronchus and lung: Secondary | ICD-10-CM | POA: Insufficient documentation

## 2023-10-09 DIAGNOSIS — D500 Iron deficiency anemia secondary to blood loss (chronic): Secondary | ICD-10-CM

## 2023-10-09 DIAGNOSIS — Z452 Encounter for adjustment and management of vascular access device: Secondary | ICD-10-CM | POA: Insufficient documentation

## 2023-10-09 MED ORDER — HEPARIN SOD (PORK) LOCK FLUSH 100 UNIT/ML IV SOLN
500.0000 [IU] | Freq: Once | INTRAVENOUS | Status: AC
  Administered 2023-10-09: 500 [IU]

## 2023-10-09 MED ORDER — SODIUM CHLORIDE 0.9% FLUSH
10.0000 mL | Freq: Once | INTRAVENOUS | Status: AC
  Administered 2023-10-09: 10 mL

## 2023-10-12 ENCOUNTER — Ambulatory Visit (HOSPITAL_COMMUNITY): Admission: RE | Admit: 2023-10-12 | Source: Ambulatory Visit

## 2023-10-20 ENCOUNTER — Emergency Department (HOSPITAL_COMMUNITY)
Admission: EM | Admit: 2023-10-20 | Discharge: 2023-10-20 | Disposition: A | Discharge status: Home / Self Care | Attending: Student | Admitting: Student

## 2023-10-20 ENCOUNTER — Emergency Department (HOSPITAL_COMMUNITY)

## 2023-10-20 DIAGNOSIS — Z87891 Personal history of nicotine dependence: Secondary | ICD-10-CM | POA: Insufficient documentation

## 2023-10-20 DIAGNOSIS — J4489 Other specified chronic obstructive pulmonary disease: Secondary | ICD-10-CM | POA: Diagnosis not present

## 2023-10-20 DIAGNOSIS — Z79899 Other long term (current) drug therapy: Secondary | ICD-10-CM | POA: Insufficient documentation

## 2023-10-20 DIAGNOSIS — I2510 Atherosclerotic heart disease of native coronary artery without angina pectoris: Secondary | ICD-10-CM | POA: Insufficient documentation

## 2023-10-20 DIAGNOSIS — Z7951 Long term (current) use of inhaled steroids: Secondary | ICD-10-CM | POA: Diagnosis not present

## 2023-10-20 DIAGNOSIS — I110 Hypertensive heart disease with heart failure: Secondary | ICD-10-CM | POA: Diagnosis not present

## 2023-10-20 DIAGNOSIS — K5900 Constipation, unspecified: Secondary | ICD-10-CM | POA: Insufficient documentation

## 2023-10-20 DIAGNOSIS — I509 Heart failure, unspecified: Secondary | ICD-10-CM | POA: Diagnosis not present

## 2023-10-20 DIAGNOSIS — R600 Localized edema: Secondary | ICD-10-CM | POA: Diagnosis not present

## 2023-10-20 DIAGNOSIS — R609 Edema, unspecified: Secondary | ICD-10-CM

## 2023-10-20 DIAGNOSIS — Z7982 Long term (current) use of aspirin: Secondary | ICD-10-CM | POA: Insufficient documentation

## 2023-10-20 LAB — COMPREHENSIVE METABOLIC PANEL WITH GFR
ALT: 23 U/L (ref 0–44)
AST: 18 U/L (ref 15–41)
Albumin: 3.3 g/dL — ABNORMAL LOW (ref 3.5–5.0)
Alkaline Phosphatase: 188 U/L — ABNORMAL HIGH (ref 38–126)
Anion gap: 9 (ref 5–15)
BUN: 11 mg/dL (ref 8–23)
CO2: 32 mmol/L (ref 22–32)
Calcium: 8.4 mg/dL — ABNORMAL LOW (ref 8.9–10.3)
Chloride: 100 mmol/L (ref 98–111)
Creatinine, Ser: 0.46 mg/dL (ref 0.44–1.00)
GFR, Estimated: >60 mL/min (ref >60)
Glucose, Bld: 117 mg/dL — ABNORMAL HIGH (ref 70–99)
Potassium: 3.8 mmol/L (ref 3.5–5.1)
Sodium: 141 mmol/L (ref 135–145)
Total Bilirubin: 0.2 mg/dL (ref 0.0–1.2)
Total Protein: 6 g/dL — ABNORMAL LOW (ref 6.5–8.1)

## 2023-10-20 LAB — CBC WITH DIFFERENTIAL/PLATELET
Abs Immature Granulocytes: 0.07 K/uL (ref 0.00–0.07)
Basophils Absolute: 0 K/uL (ref 0.0–0.1)
Basophils Relative: 0 %
Eosinophils Absolute: 0.1 K/uL (ref 0.0–0.5)
Eosinophils Relative: 1 %
HCT: 25.9 % — ABNORMAL LOW (ref 36.0–46.0)
Hemoglobin: 7.8 g/dL — ABNORMAL LOW (ref 12.0–15.0)
Immature Granulocytes: 1 %
Lymphocytes Relative: 8 %
Lymphs Abs: 0.6 K/uL — ABNORMAL LOW (ref 0.7–4.0)
MCH: 23.4 pg — ABNORMAL LOW (ref 26.0–34.0)
MCHC: 30.1 g/dL (ref 30.0–36.0)
MCV: 77.8 fL — ABNORMAL LOW (ref 80.0–100.0)
Monocytes Absolute: 0.5 K/uL (ref 0.1–1.0)
Monocytes Relative: 6 %
Neutro Abs: 6.4 K/uL (ref 1.7–7.7)
Neutrophils Relative %: 84 %
Platelets: 377 K/uL (ref 150–400)
RBC: 3.33 MIL/uL — ABNORMAL LOW (ref 3.87–5.11)
RDW: 20.3 % — ABNORMAL HIGH (ref 11.5–15.5)
WBC: 7.7 K/uL (ref 4.0–10.5)
nRBC: 0.9 % — ABNORMAL HIGH (ref 0.0–0.2)

## 2023-10-20 LAB — BRAIN NATRIURETIC PEPTIDE: B Natriuretic Peptide: 599 pg/mL — ABNORMAL HIGH (ref 0.0–100.0)

## 2023-10-20 LAB — TROPONIN I (HIGH SENSITIVITY): Troponin I (High Sensitivity): 14 ng/L (ref <18)

## 2023-10-20 MED ORDER — FUROSEMIDE 10 MG/ML IJ SOLN
40.0000 mg | Freq: Once | INTRAMUSCULAR | Status: AC
  Administered 2023-10-20: 40 mg via INTRAVENOUS
  Filled 2023-10-20: qty 4

## 2023-10-20 MED ORDER — POLYETHYLENE GLYCOL 3350 17 G PO PACK: 17.0000 g | PACK | Freq: Every day | ORAL | 0 refills | Status: DC

## 2023-10-20 NOTE — Discharge Instructions (Signed)
 Please take your Lasix  every day over the next 5 days to help with the lower extremity swelling.

## 2023-10-20 NOTE — ED Triage Notes (Signed)
 Pt arrives via EMS from home with c/o constipation x 1 week and increased swelling to both legs. Pt wears 4 L [place] at baseline.

## 2023-10-20 NOTE — ED Provider Notes (Signed)
 [place] [facility] AT [facility] Provider Note  CSN: [id number] Arrival date & time: 10/20/23 1729  Chief Complaint(s) Constipation  HPI Courtney Zuniga is a 73 y.o. 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  States that she has not had a bowel movement in 7 days.  She states that she has been taking laxatives without improvement.  Also states that her lower extremities have swollen significant over the last 7 days.  Denies worsening shortness of breath, nausea, vomiting, headache, fever or other systemic symptoms.   Past Medical History Past Medical History:  Diagnosis Date   Anemia    as a young woman   Arthritis    osteoartritis   Asthma    Brain tumor (benign) (HCC) 2005 [facility]   Benign   Chronic headaches    Chronic hip pain    Chronic pain    COPD (chronic obstructive pulmonary disease) (HCC)    Coronary artery disease    Depression    Depression 05/15/2016   Encounter for antineoplastic chemotherapy 01/10/2016   GERD (gastroesophageal reflux disease)    Hypertension    Lung cancer (HCC) dx'd 01/2016   currently on chemo and radiation    NSTEMI (non-ST elevated myocardial infarction) (HCC) yrs ago   On home O2    qhs 2 liters at hs and prn   Pneumonia last time 2 yrs ago   Shortness of breath dyspnea    with activity   Patient Active Problem List   Diagnosis Date Noted   CO2 narcosis 09/19/2023   COPD (chronic obstructive pulmonary disease) (HCC) 09/18/2023   Chronic anemia 08/11/2023   Acute metabolic encephalopathy 08/10/2023   COPD GOLD 2 criteria 2017 08/07/2023   Chronic respiratory failure with hypoxia and hypercapnia (HCC) 08/07/2023   Lobar pneumonia (HCC) 06/03/2023   Acute on chronic hypoxic respiratory failure (HCC) 05/28/2023   Sinus  tachycardia 10/13/2022   (HFpEF) heart failure with preserved ejection fraction (HCC) 10/13/2022   Aortic regurgitation 10/13/2022   Aortic stenosis 10/13/2022   Nicotine  abuse 10/13/2022   DOE (dyspnea on exertion) 10/06/2022   Acute on chronic respiratory failure with hypoxia (HCC) 08/25/2022   Pleural effusion 08/25/2022   Hypotension 06/12/2022   Iron  deficiency anemia 06/12/2022   Acute on chronic anemia 05/20/2022   History of Gastric ulcer 05/20/2022   AVM (arteriovenous malformation) of small bowel, 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   Upper GI bleed 05/18/2022   Acute blood loss anemia 05/18/2022   Acute respiratory failure with hypoxia (HCC) 04/23/2022   Frequent PVCs 04/09/2022   Port-A-Cath in place 03/03/2022   Increased frequency of headaches 10/18/2020   COPD exacerbation (HCC) 11/05/2019   Chronic nonintractable headache    Acute on chronic respiratory failure with hypoxia and hypercapnia (HCC) 06/16/2018   Influenza A 06/16/2018   Severe protein-calorie malnutrition (HCC) 06/16/2018   GERD (gastroesophageal reflux disease) 06/16/2018   Bilateral pneumonia 05/05/2018   Sepsis due to CAP 05/05/2018   Chronic respiratory failure with hypoxia (HCC) 05/05/2018   Encounter for antineoplastic immunotherapy 04/23/2017   Depression 05/15/2016   Adenocarcinoma of right lung, stage 4 01/10/2016   Encounter for antineoplastic chemotherapy 01/10/2016   Lung mass 01/03/2016   Arthritis    Subepithelial gastric mass    Mucosal abnormality of stomach    Abdominal pain 08/08/2015  Abnormal CT of the abdomen 08/08/2015   Hypoxia 06/26/2014   COPD with acute exacerbation (HCC) 06/26/2014   Community acquired bilateral lower lobe pneumonia    Chest pain of uncertain etiology    Asthma, chronic    Chest pain 02/16/2014   HTN (hypertension) 02/16/2014   DDD (degenerative disc disease), lumbar 02/04/2011   Lumbar herniated disc 01/09/2011   Home Medication(s) Prior to Admission  medications   Medication Sig Start Date End Date Taking? Authorizing Provider  polyethylene glycol (MIRALAX ) 17 g packet Take 17 g by mouth daily. 10/20/23  Yes Barlow Harrison, MD  albuterol  (PROVENTIL ) (2.5 MG/3ML) 0.083% nebulizer solution Take 3 mLs (2.5 mg total) by nebulization 3 (three) times daily as needed for shortness of breath or wheezing. 08/28/22   Pearlean Manus, MD  albuterol  (VENTOLIN  HFA) 108 (90 Base) MCG/ACT inhaler Inhale 2 puffs into the lungs every 4 (four) hours as needed for shortness of breath. 08/28/22   Pearlean Manus, MD  Aspirin -Salicylamide-Caffeine  (BC HEADACHE POWDER PO) Take 1 packet by mouth as needed (headache).    [provider]  benzonatate (TESSALON) 100 MG capsule Take 100 mg by mouth 3 (three) times daily as needed for cough. 05/22/23   [provider]  bisoprolol  (ZEBETA ) 5 MG tablet Take 0.5 tablets (2.5 mg total) by mouth daily. 09/22/23   Johnson, Clanford L, MD  docusate sodium  (COLACE) 100 MG capsule Take 1 capsule (100 mg total) by mouth 2 (two) times daily. 08/12/23 08/11/24  Maree, Pratik D, DO  ENSURE (ENSURE) Take 237 mLs by mouth 3 (three) times daily between meals.    [provider]  ferrous sulfate  325 (65 FE) MG tablet Take 1 tablet (325 mg total) by mouth daily with breakfast. 08/13/23 09/18/23  Maree, Pratik D, DO  Fluticasone -Umeclidin-Vilant (TRELEGY ELLIPTA ) 100-62.5-25 MCG/ACT AEPB INHALE 1 PUFF BY MOUTH EVERY DAY    [provider]  furosemide  (LASIX ) 20 MG tablet Take 1 tablet (20 mg total) by mouth every Monday, Wednesday, and Friday. 09/23/23   Johnson, Clanford L, MD  guaiFENesin -dextromethorphan  (ROBITUSSIN DM) 100-10 MG/5ML syrup Take 5 mLs by mouth every 4 (four) hours as needed for cough. 09/22/23   Johnson, Clanford L, MD  Ipratropium-Albuterol  (COMBIVENT  RESPIMAT) 20-100 MCG/ACT AERS respimat Inhale 1 puff into the lungs every 6 (six) hours as needed for wheezing or shortness of breath. 08/28/22   Pearlean Manus, MD  oxyCODONE -acetaminophen  (PERCOCET/ROXICET) 5-325 MG tablet Take 1 tablet by mouth every 8 (eight) hours as needed for moderate pain. 05/08/18   Pearlean Manus, MD  pantoprazole  (PROTONIX ) 40 MG tablet Take 1 tablet (40 mg total) by mouth 2 (two) times daily. 08/28/22 09/18/23  Pearlean Manus, MD  predniSONE  (DELTASONE ) 20 MG tablet Take 3 PO QAM x5days, 2 PO QAM x5days, 1 PO QAM x5days 09/23/23   Johnson, Clanford L, MD  traZODone  (DESYREL ) 50 MG tablet Take 1 tablet (50 mg total) by mouth at bedtime. 08/28/22   Pearlean Manus, MD  HYDROcodone  bit-homatropine (HYCODAN) 5-1.5 MG/5ML syrup Take 5 mLs by mouth every 6 (six) hours as needed for cough. 11/10/21   Sherrod Sherrod, MD  Past Surgical History Past Surgical History:  Procedure Laterality Date   CHOLECYSTECTOMY     COLONOSCOPY  2015   Results requested from [facility]   COLONOSCOPY     ESOPHAGOGASTRODUODENOSCOPY N/A 08/14/2015   Procedure: ESOPHAGOGASTRODUODENOSCOPY (EGD);  Surgeon: Lamar CHRISTELLA Hollingshead, MD;  Location: [facility];  Service: Endoscopy;  Laterality: N/A;  215    ESOPHAGOGASTRODUODENOSCOPY (EGD) WITH PROPOFOL  N/A 09/13/2015   Procedure: ESOPHAGOGASTRODUODENOSCOPY (EGD) WITH PROPOFOL ;  Surgeon: Toribio SHAUNNA Cedar, MD;  Location: [facility];  Service: Endoscopy;  Laterality: N/A;   ESOPHAGOGASTRODUODENOSCOPY (EGD) WITH PROPOFOL  N/A 05/19/2022   Procedure: ESOPHAGOGASTRODUODENOSCOPY (EGD) WITH PROPOFOL ;  Surgeon: Cindie Carlin POUR, DO;  Location: [facility];  Service: Endoscopy;  Laterality: N/A;   EUS N/A 03/12/2017   Procedure: UPPER ENDOSCOPIC ULTRASOUND (EUS) RADIAL;  Surgeon: Cedar Toribio SHAUNNA, MD;  Location: [facility];  Service: Endoscopy;  Laterality: N/A;   HOT HEMOSTASIS  05/19/2022   Procedure: HOT HEMOSTASIS (ARGON PLASMA COAGULATION/BICAP);  Surgeon: Cindie Carlin POUR,  DO;  Location: [facility];  Service: Endoscopy;;   IR IMAGING GUIDED PORT INSERTION  01/20/2022   TUMOR REMOVAL  2005   Benign   UPPER ESOPHAGEAL ENDOSCOPIC ULTRASOUND (EUS)  09/13/2015   Procedure: UPPER ESOPHAGEAL ENDOSCOPIC ULTRASOUND (EUS);  Surgeon: Toribio SHAUNNA Cedar, MD;  Location: [facility];  Service: Endoscopy;;   VIDEO BRONCHOSCOPY WITH ENDOBRONCHIAL NAVIGATION N/A 12/31/2015   Procedure: VIDEO BRONCHOSCOPY WITH ENDOBRONCHIAL NAVIGATION;  Surgeon: Elspeth JAYSON Millers, MD;  Location: [facility] OR;  Service: Thoracic;  Laterality: N/A;   VIDEO BRONCHOSCOPY WITH ENDOBRONCHIAL ULTRASOUND N/A 11/08/2015   Procedure: VIDEO BRONCHOSCOPY WITH ENDOBRONCHIAL ULTRASOUND;  Surgeon: Maude Fleeta Ochoa, MD;  Location: [facility] OR;  Service: Thoracic;  Laterality: N/A;   Family History Family History  Problem Relation Age of Onset   Ovarian cancer Mother    Lung cancer Father    Brain cancer Sister        Half sister   Lung cancer Brother    Lung cancer Brother    Prostate cancer Brother    Colon cancer Neg Hx    Colon polyps Neg Hx     Social History Social History   Tobacco Use   Smoking status: Former    Current packs/day: 0.00    Average packs/day: 1 pack/day for 38.0 years (38.0 ttl pk-yrs)    Types: Cigarettes    Start date: 07/25/1980    Quit date: 07/26/2018    Years since quitting: 5.2   Smokeless tobacco: Never   Tobacco comments:    Pt has asked doctor for med to help   Vaping Use   Vaping status: Never Used  Substance Use Topics   Alcohol use: No    Alcohol/week: 0.0 standard drinks of alcohol   Drug use: No   Allergies Desyrel  [trazodone ]  Review of Systems Review of Systems  Cardiovascular:  Positive for leg swelling.  Gastrointestinal:  Positive for abdominal pain and constipation.    Physical Exam Vital Signs  I have reviewed the triage vital signs BP 110/76 (BP Location: Right Arm)   Pulse 89   Temp 98 F (36.7 C) (Oral)   Resp 19   SpO2 97%   Physical  Exam Vitals and nursing note reviewed.  Constitutional:      General: She is not in acute distress.    Appearance: She is well-developed.  HENT:     Head: Normocephalic and atraumatic.  Eyes:     Conjunctiva/sclera: Conjunctivae normal.  Cardiovascular:     Rate and Rhythm: Normal rate and regular rhythm.     Heart sounds: No murmur heard. Pulmonary:     Effort: Pulmonary effort is normal. No respiratory distress.     Breath sounds: Normal breath sounds.  Abdominal:     Palpations: Abdomen is soft.     Tenderness: There is abdominal tenderness.  Musculoskeletal:        General: No swelling.     Cervical back: Neck supple.     Right lower leg: Edema present.     Left lower leg: Edema present.  Skin:    General: Skin is warm and dry.     Capillary Refill: Capillary refill takes less than 2 seconds.  Neurological:     Mental Status: She is alert.  Psychiatric:        Mood and Affect: Mood normal.     ED Results and Treatments Labs (all labs ordered are listed, but only abnormal results are displayed) Labs Reviewed  COMPREHENSIVE METABOLIC PANEL WITH GFR - Abnormal; Notable for the following components:      Result Value   Glucose, Bld 117 (*)    Calcium  8.4 (*)    Total Protein 6.0 (*)    Albumin  3.3 (*)    Alkaline Phosphatase 188 (*)    All other components within normal limits  CBC WITH DIFFERENTIAL/PLATELET - Abnormal; Notable for the following components:   RBC 3.33 (*)    Hemoglobin 7.8 (*)    HCT 25.9 (*)    MCV 77.8 (*)    MCH 23.4 (*)    RDW 20.3 (*)    nRBC 0.9 (*)    Lymphs Abs 0.6 (*)    All other components within normal limits  BRAIN NATRIURETIC PEPTIDE - Abnormal; Notable for the following components:   B Natriuretic Peptide 599.0 (*)    All other components within normal limits  TROPONIN I (HIGH SENSITIVITY)                                                                                                                          Radiology CT  ABDOMEN PELVIS WO CONTRAST Result Date: 10/20/2023 CLINICAL DATA:  Bowel obstruction suspected.  Constipation. EXAM: CT ABDOMEN AND PELVIS WITHOUT CONTRAST TECHNIQUE: Multidetector CT imaging of the abdomen and pelvis was performed following the standard protocol without IV contrast. RADIATION DOSE REDUCTION: This exam was performed according to the departmental dose-optimization program which includes automated exposure control, adjustment of the mA and/or kV according to patient size and/or use of iterative reconstruction technique. COMPARISON:  08/04/2023 FINDINGS: Lower chest: Peribronchial thickening. Nodular opacities in the left lower lobe could reflect atelectasis or early bronchopneumonia. Dependent opacities in the right lower lobe, likely atelectasis. No effusions. Hepatobiliary: No focal liver abnormality is seen. Status post cholecystectomy. No biliary dilatation. Pancreas: No focal abnormality or ductal dilatation. Spleen: No focal abnormality.  Normal size. Adrenals/Urinary Tract: Bilateral renal hypodensities are similar prior study and  likely reflect small cysts. No follow-up imaging recommended. No stones or hydronephrosis. Adrenal glands and urinary bladder unremarkable. Stomach/Bowel: Left colonic diverticulosis. No active diverticulitis. No bowel obstruction. Stomach and small bowel decompressed, unremarkable. Vascular/Lymphatic: Aortoiliac atherosclerosis. No evidence of aneurysm or adenopathy. Reproductive: Calcified fibroids in the uterus.  No adnexal mass. Other: No free fluid or free air. Musculoskeletal: No acute bony abnormality. IMPRESSION: No evidence of bowel obstruction. Left colonic diverticulosis. No active diverticulitis. Coronary artery disease, aortoiliac atherosclerosis. Bronchial wall thickening in the lower lobes with patchy nodular opacities in the left lower lobe which could reflect atelectasis or early bronchopneumonia. Right base atelectasis. Electronically Signed   By:  Franky Crease M.D.   On: 10/20/2023 19:05    Pertinent labs & imaging results that were available during my care of the patient were reviewed by me and considered in my medical decision making (see MDM for details).  Medications Ordered in ED Medications  furosemide  (LASIX ) injection 40 mg (40 mg Intravenous Given 10/20/23 2016)                                                                                                                                     Procedures Procedures  (including critical care time)  Medical Decision Making / ED Course   This patient presents to the ED for concern of constipation, leg swelling, this involves an extensive number of treatment options, and is a complaint that carries with it a high risk of complications and morbidity.  The differential diagnosis includes constipation, obstruction, edema, heart failure, renal failure  MDM: Patient seen emerged part for evaluation of multiple complaints described above.  Physical exam with 1+ pitting edema bilaterally and generalized abdominal tenderness to palpation.  Laboratory evaluation with a hemoglobin of 7.8 which is near patient's baseline, creatinine normal, albumin  3.3, BNP elevated to 599.0, high-sensitivity troponin is normal.  CT abdomen pelvis largely unremarkable but there is a sizable amount of stool in the rectal vault.  A soapsuds enema was performed and patient did have successful evacuation of the rectal vault here in the Emergency Department.  On reevaluation she is feeling much improved.  She takes Lasix  3 times weekly and I did encourage her for the next 5 days to take it every day to help with her lower extremity edema.  At this time she does not meet inpatient criteria for admission and will be discharged with outpatient follow-up.   Additional history obtained: -Additional history obtained from multifamily members -External records from outside source obtained and reviewed including: Chart  review including previous notes, labs, imaging, consultation notes   Lab Tests: -I ordered, reviewed, and interpreted labs.   The pertinent results include:   Labs Reviewed  COMPREHENSIVE METABOLIC PANEL WITH GFR - Abnormal; Notable for the following components:      Result Value   Glucose, Bld 117 (*)    Calcium  8.4 (*)  Total Protein 6.0 (*)    Albumin  3.3 (*)    Alkaline Phosphatase 188 (*)    All other components within normal limits  CBC WITH DIFFERENTIAL/PLATELET - Abnormal; Notable for the following components:   RBC 3.33 (*)    Hemoglobin 7.8 (*)    HCT 25.9 (*)    MCV 77.8 (*)    MCH 23.4 (*)    RDW 20.3 (*)    nRBC 0.9 (*)    Lymphs Abs 0.6 (*)    All other components within normal limits  BRAIN NATRIURETIC PEPTIDE - Abnormal; Notable for the following components:   B Natriuretic Peptide 599.0 (*)    All other components within normal limits  TROPONIN I (HIGH SENSITIVITY)       Imaging Studies ordered: I ordered imaging studies including CTAP I independently visualized and interpreted imaging. I agree with the radiologist interpretation   Medicines ordered and prescription drug management: Meds ordered this encounter  Medications   furosemide  (LASIX ) injection 40 mg   polyethylene glycol (MIRALAX ) 17 g packet    Sig: Take 17 g by mouth daily.    Dispense:  14 each    Refill:  0    -I have reviewed the patients home medicines and have made adjustments as needed  Critical interventions none   Social Determinants of Health:  Factors impacting patients care include: none   Reevaluation: After the interventions noted above, I reevaluated the patient and found that they have :improved  Co morbidities that complicate the patient evaluation  Past Medical History:  Diagnosis Date   Anemia    as a young woman   Arthritis    osteoartritis   Asthma    Brain tumor (benign) (HCC) 2005 [facility]   Benign   Chronic headaches    Chronic hip pain     Chronic pain    COPD (chronic obstructive pulmonary disease) (HCC)    Coronary artery disease    Depression    Depression 05/15/2016   Encounter for antineoplastic chemotherapy 01/10/2016   GERD (gastroesophageal reflux disease)    Hypertension    Lung cancer (HCC) dx'd 01/2016   currently on chemo and radiation    NSTEMI (non-ST elevated myocardial infarction) (HCC) yrs ago   On home O2    qhs 2 liters at hs and prn   Pneumonia last time 2 yrs ago   Shortness of breath dyspnea    with activity      Dispostion: I considered admission for this patient, but at this time she does not meet inpatient criteria for admission and will be discharged outpatient follow-up     Final Clinical Impression(s) / ED Diagnoses Final diagnoses:  Constipation, unspecified constipation type  Edema, unspecified type     @PCDICTATION @    Albertina Dixon, MD 10/21/23 0157

## 2023-10-20 NOTE — ED Notes (Signed)
 ED Provider at bedside.

## 2023-10-20 NOTE — ED Notes (Signed)
 Patient transported to CT

## 2023-10-22 ENCOUNTER — Encounter: Payer: Self-pay | Admitting: Gastroenterology

## 2023-10-22 ENCOUNTER — Ambulatory Visit: Admitting: Gastroenterology

## 2023-10-24 ENCOUNTER — Inpatient Hospital Stay (HOSPITAL_COMMUNITY)
Admission: EM | Admit: 2023-10-24 | Discharge: 2023-10-30 | DRG: 193 | Disposition: A | Discharge status: Home Health | Attending: Family Medicine | Admitting: Family Medicine

## 2023-10-24 ENCOUNTER — Other Ambulatory Visit: Payer: Self-pay

## 2023-10-24 ENCOUNTER — Encounter (HOSPITAL_COMMUNITY): Payer: Self-pay

## 2023-10-24 ENCOUNTER — Emergency Department (HOSPITAL_COMMUNITY)

## 2023-10-24 DIAGNOSIS — I10 Essential (primary) hypertension: Secondary | ICD-10-CM | POA: Diagnosis present

## 2023-10-24 DIAGNOSIS — J441 Chronic obstructive pulmonary disease with (acute) exacerbation: Principal | ICD-10-CM | POA: Diagnosis present

## 2023-10-24 DIAGNOSIS — Z515 Encounter for palliative care: Secondary | ICD-10-CM | POA: Diagnosis not present

## 2023-10-24 DIAGNOSIS — R64 Cachexia: Secondary | ICD-10-CM | POA: Diagnosis present

## 2023-10-24 DIAGNOSIS — K219 Gastro-esophageal reflux disease without esophagitis: Secondary | ICD-10-CM | POA: Diagnosis present

## 2023-10-24 DIAGNOSIS — I959 Hypotension, unspecified: Secondary | ICD-10-CM | POA: Diagnosis present

## 2023-10-24 DIAGNOSIS — M1990 Unspecified osteoarthritis, unspecified site: Secondary | ICD-10-CM | POA: Diagnosis present

## 2023-10-24 DIAGNOSIS — E873 Alkalosis: Secondary | ICD-10-CM | POA: Diagnosis not present

## 2023-10-24 DIAGNOSIS — E46 Unspecified protein-calorie malnutrition: Secondary | ICD-10-CM | POA: Diagnosis not present

## 2023-10-24 DIAGNOSIS — K5909 Other constipation: Secondary | ICD-10-CM | POA: Diagnosis present

## 2023-10-24 DIAGNOSIS — Z66 Do not resuscitate: Secondary | ICD-10-CM | POA: Diagnosis not present

## 2023-10-24 DIAGNOSIS — K625 Hemorrhage of anus and rectum: Secondary | ICD-10-CM | POA: Diagnosis not present

## 2023-10-24 DIAGNOSIS — J9622 Acute and chronic respiratory failure with hypercapnia: Secondary | ICD-10-CM | POA: Diagnosis not present

## 2023-10-24 DIAGNOSIS — Z9981 Dependence on supplemental oxygen: Secondary | ICD-10-CM | POA: Diagnosis not present

## 2023-10-24 DIAGNOSIS — Z555 Less than a high school diploma: Secondary | ICD-10-CM

## 2023-10-24 DIAGNOSIS — Z9049 Acquired absence of other specified parts of digestive tract: Secondary | ICD-10-CM

## 2023-10-24 DIAGNOSIS — Z9221 Personal history of antineoplastic chemotherapy: Secondary | ICD-10-CM

## 2023-10-24 DIAGNOSIS — D649 Anemia, unspecified: Secondary | ICD-10-CM

## 2023-10-24 DIAGNOSIS — Z79899 Other long term (current) drug therapy: Secondary | ICD-10-CM

## 2023-10-24 DIAGNOSIS — E8809 Other disorders of plasma-protein metabolism, not elsewhere classified: Secondary | ICD-10-CM | POA: Diagnosis present

## 2023-10-24 DIAGNOSIS — I252 Old myocardial infarction: Secondary | ICD-10-CM | POA: Diagnosis not present

## 2023-10-24 DIAGNOSIS — K254 Chronic or unspecified gastric ulcer with hemorrhage: Secondary | ICD-10-CM | POA: Diagnosis present

## 2023-10-24 DIAGNOSIS — Z801 Family history of malignant neoplasm of trachea, bronchus and lung: Secondary | ICD-10-CM

## 2023-10-24 DIAGNOSIS — Z8042 Family history of malignant neoplasm of prostate: Secondary | ICD-10-CM

## 2023-10-24 DIAGNOSIS — E878 Other disorders of electrolyte and fluid balance, not elsewhere classified: Secondary | ICD-10-CM | POA: Diagnosis not present

## 2023-10-24 DIAGNOSIS — J9621 Acute and chronic respiratory failure with hypoxia: Secondary | ICD-10-CM | POA: Diagnosis present

## 2023-10-24 DIAGNOSIS — R079 Chest pain, unspecified: Secondary | ICD-10-CM | POA: Diagnosis present

## 2023-10-24 DIAGNOSIS — J439 Emphysema, unspecified: Secondary | ICD-10-CM | POA: Diagnosis present

## 2023-10-24 DIAGNOSIS — Z87891 Personal history of nicotine dependence: Secondary | ICD-10-CM

## 2023-10-24 DIAGNOSIS — C3491 Malignant neoplasm of unspecified part of right bronchus or lung: Secondary | ICD-10-CM | POA: Diagnosis present

## 2023-10-24 DIAGNOSIS — Z6820 Body mass index (BMI) 20.0-20.9, adult: Secondary | ICD-10-CM

## 2023-10-24 DIAGNOSIS — D509 Iron deficiency anemia, unspecified: Secondary | ICD-10-CM | POA: Diagnosis present

## 2023-10-24 DIAGNOSIS — Z8719 Personal history of other diseases of the digestive system: Secondary | ICD-10-CM | POA: Diagnosis not present

## 2023-10-24 DIAGNOSIS — R54 Age-related physical debility: Secondary | ICD-10-CM | POA: Diagnosis present

## 2023-10-24 DIAGNOSIS — Z808 Family history of malignant neoplasm of other organs or systems: Secondary | ICD-10-CM

## 2023-10-24 DIAGNOSIS — Z923 Personal history of irradiation: Secondary | ICD-10-CM

## 2023-10-24 DIAGNOSIS — Z8711 Personal history of peptic ulcer disease: Secondary | ICD-10-CM | POA: Diagnosis not present

## 2023-10-24 DIAGNOSIS — Z86011 Personal history of benign neoplasm of the brain: Secondary | ICD-10-CM

## 2023-10-24 DIAGNOSIS — J9620 Acute and chronic respiratory failure, unspecified whether with hypoxia or hypercapnia: Secondary | ICD-10-CM | POA: Diagnosis present

## 2023-10-24 DIAGNOSIS — Z7189 Other specified counseling: Secondary | ICD-10-CM | POA: Diagnosis not present

## 2023-10-24 DIAGNOSIS — I2510 Atherosclerotic heart disease of native coronary artery without angina pectoris: Secondary | ICD-10-CM | POA: Diagnosis present

## 2023-10-24 DIAGNOSIS — D62 Acute posthemorrhagic anemia: Secondary | ICD-10-CM | POA: Diagnosis not present

## 2023-10-24 DIAGNOSIS — Z85118 Personal history of other malignant neoplasm of bronchus and lung: Secondary | ICD-10-CM

## 2023-10-24 DIAGNOSIS — J189 Pneumonia, unspecified organism: Secondary | ICD-10-CM | POA: Diagnosis not present

## 2023-10-24 DIAGNOSIS — J440 Chronic obstructive pulmonary disease with acute lower respiratory infection: Secondary | ICD-10-CM | POA: Diagnosis present

## 2023-10-24 DIAGNOSIS — I351 Nonrheumatic aortic (valve) insufficiency: Secondary | ICD-10-CM | POA: Diagnosis present

## 2023-10-24 DIAGNOSIS — Z888 Allergy status to other drugs, medicaments and biological substances status: Secondary | ICD-10-CM

## 2023-10-24 DIAGNOSIS — Z8041 Family history of malignant neoplasm of ovary: Secondary | ICD-10-CM

## 2023-10-24 DIAGNOSIS — E441 Mild protein-calorie malnutrition: Secondary | ICD-10-CM | POA: Diagnosis present

## 2023-10-24 DIAGNOSIS — R195 Other fecal abnormalities: Secondary | ICD-10-CM | POA: Diagnosis not present

## 2023-10-24 LAB — COMPREHENSIVE METABOLIC PANEL WITH GFR
ALT: 19 U/L (ref 0–44)
AST: 19 U/L (ref 15–41)
Albumin: 3.3 g/dL — ABNORMAL LOW (ref 3.5–5.0)
Alkaline Phosphatase: 187 U/L — ABNORMAL HIGH (ref 38–126)
Anion gap: 12 (ref 5–15)
BUN: 12 mg/dL (ref 8–23)
CO2: 32 mmol/L (ref 22–32)
Calcium: 8.8 mg/dL — ABNORMAL LOW (ref 8.9–10.3)
Chloride: 94 mmol/L — ABNORMAL LOW (ref 98–111)
Creatinine, Ser: 0.51 mg/dL (ref 0.44–1.00)
GFR, Estimated: >60 mL/min (ref >60)
Glucose, Bld: 107 mg/dL — ABNORMAL HIGH (ref 70–99)
Potassium: 4.1 mmol/L (ref 3.5–5.1)
Sodium: 138 mmol/L (ref 135–145)
Total Bilirubin: 0.2 mg/dL (ref 0.0–1.2)
Total Protein: 6.5 g/dL (ref 6.5–8.1)

## 2023-10-24 LAB — CBC WITH DIFFERENTIAL/PLATELET
Abs Immature Granulocytes: 0.03 K/uL (ref 0.00–0.07)
Basophils Absolute: 0 K/uL (ref 0.0–0.1)
Basophils Relative: 0 %
Eosinophils Absolute: 0 K/uL (ref 0.0–0.5)
Eosinophils Relative: 0 %
HCT: 26.9 % — ABNORMAL LOW (ref 36.0–46.0)
Hemoglobin: 8 g/dL — ABNORMAL LOW (ref 12.0–15.0)
Immature Granulocytes: 0 %
Lymphocytes Relative: 8 %
Lymphs Abs: 0.6 K/uL — ABNORMAL LOW (ref 0.7–4.0)
MCH: 23.2 pg — ABNORMAL LOW (ref 26.0–34.0)
MCHC: 29.7 g/dL — ABNORMAL LOW (ref 30.0–36.0)
MCV: 78 fL — ABNORMAL LOW (ref 80.0–100.0)
Monocytes Absolute: 0.7 K/uL (ref 0.1–1.0)
Monocytes Relative: 9 %
Neutro Abs: 6.5 K/uL (ref 1.7–7.7)
Neutrophils Relative %: 83 %
Platelets: 408 K/uL — ABNORMAL HIGH (ref 150–400)
RBC: 3.45 MIL/uL — ABNORMAL LOW (ref 3.87–5.11)
RDW: 19.9 % — ABNORMAL HIGH (ref 11.5–15.5)
WBC: 7.8 K/uL (ref 4.0–10.5)
nRBC: 0.8 % — ABNORMAL HIGH (ref 0.0–0.2)

## 2023-10-24 LAB — TROPONIN I (HIGH SENSITIVITY)
Troponin I (High Sensitivity): 12 ng/L (ref <18)
Troponin I (High Sensitivity): 11 ng/L (ref <18)

## 2023-10-24 LAB — POC OCCULT BLOOD, ED: Fecal Occult Blood: POSITIVE

## 2023-10-24 MED ORDER — SODIUM CHLORIDE 0.9 % IV SOLN
500.0000 mg | INTRAVENOUS | Status: DC
  Administered 2023-10-24 – 2023-10-25 (×2): 500 mg via INTRAVENOUS
  Filled 2023-10-24 (×2): qty 5

## 2023-10-24 MED ORDER — SODIUM CHLORIDE 0.9 % IV SOLN
1.0000 g | Freq: Once | INTRAVENOUS | Status: AC
  Administered 2023-10-24: 1 g via INTRAVENOUS
  Filled 2023-10-24: qty 10

## 2023-10-24 MED ORDER — IPRATROPIUM-ALBUTEROL 0.5-2.5 (3) MG/3ML IN SOLN
3.0000 mL | Freq: Once | RESPIRATORY_TRACT | Status: AC
  Administered 2023-10-24: 3 mL via RESPIRATORY_TRACT
  Filled 2023-10-24: qty 3

## 2023-10-24 MED ORDER — ALBUTEROL SULFATE (2.5 MG/3ML) 0.083% IN NEBU
3.0000 mL | INHALATION_SOLUTION | RESPIRATORY_TRACT | Status: AC
  Administered 2023-10-24: 3 mL via RESPIRATORY_TRACT
  Filled 2023-10-24: qty 3

## 2023-10-24 NOTE — ED Triage Notes (Addendum)
 Pt arrives via [facility] c/o left rib pain x couple days. On 4L chronically, CA pt. Also c/o leg pain and dark stools. Bed bugs present.

## 2023-10-24 NOTE — H&P (Incomplete)
 History and Physical    PatientMadora Zuniga FMW:[id number] DOB: [birth year] DOA: 10/24/2023 DOS: the patient was seen and examined on 10/25/2023 PCP: Myra Geni ORN, FNP  Patient coming from: Home  Chief Complaint:  Chief Complaint  Patient presents with   Chest Pain   Leg Swelling   HPI: Courtney Zuniga is a 73 y.o. female with medical history significant of COPD, asthma, chronic respiratory failure with hypoxia on supplemental oxygen  via [place] at 4 LPM, coronary artery disease, depression and lung cancer who presents to the emergency department due to several days onset of left-sided chest pain with progressive shortness of breath and several weeks of nonproductive cough.  She was noted to have black stool and her home health nurse states that this may be due to blood in her stool.  Patient endorsed taking Pepto-Bismol recently.  ED Course:  In the emergency department, she was tachypneic with respiratory rate of 35/min, tachycardic with HR of 103 bpm, O2 sat 97% on 4 LPM, other vital signs are within normal range.  Workup in the ED showed WBC 7.8, hemoglobin 8.0, hematocrit 26.9, MCV 78.0, platelets 408.  BMP was normal except for chloride of 94 and blood glucose of 107, albumin  3.3, ALP 187.  FOBT positive, troponin x 2 - 12 > 11. Chest x-ray showed new airspace opacities in the left lower lobe, suspicious for pneumonia. Stable focal opacity in the right mid lung and linear atelectasis or scarring in the right lung base. Patient was treated with IV ceftriaxone  and azithromycin , breathing treatment with DuoNeb was provided.  [facility] was asked to admit patient.  Review of Systems: Review of systems as noted in the HPI. All other systems reviewed and are negative.   Past Medical History:  Diagnosis Date   Anemia    as a young woman   Arthritis    osteoartritis   Asthma    Brain tumor (benign) (HCC) 2005 [facility]   Benign   Chronic headaches    Chronic hip pain    Chronic pain    COPD  (chronic obstructive pulmonary disease) (HCC)    Coronary artery disease    Depression    Depression 05/15/2016   Encounter for antineoplastic chemotherapy 01/10/2016   GERD (gastroesophageal reflux disease)    Hypertension    Lung cancer (HCC) dx'd 01/2016   currently on chemo and radiation    NSTEMI (non-ST elevated myocardial infarction) (HCC) yrs ago   On home O2    qhs 2 liters at hs and prn   Pneumonia last time 2 yrs ago   Shortness of breath dyspnea    with activity   Past Surgical History:  Procedure Laterality Date   CHOLECYSTECTOMY     COLONOSCOPY  2015   Results requested from [facility]   COLONOSCOPY     ESOPHAGOGASTRODUODENOSCOPY N/A 08/14/2015   Procedure: ESOPHAGOGASTRODUODENOSCOPY (EGD);  Surgeon: Lamar CHRISTELLA Hollingshead, MD;  Location: [facility];  Service: Endoscopy;  Laterality: N/A;  215    ESOPHAGOGASTRODUODENOSCOPY (EGD) WITH PROPOFOL  N/A 09/13/2015   Procedure: ESOPHAGOGASTRODUODENOSCOPY (EGD) WITH PROPOFOL ;  Surgeon: Toribio SHAUNNA Cedar, MD;  Location: [facility];  Service: Endoscopy;  Laterality: N/A;   ESOPHAGOGASTRODUODENOSCOPY (EGD) WITH PROPOFOL  N/A 05/19/2022   Procedure: ESOPHAGOGASTRODUODENOSCOPY (EGD) WITH PROPOFOL ;  Surgeon: Cindie Carlin POUR, DO;  Location: [facility];  Service: Endoscopy;  Laterality: N/A;   EUS N/A 03/12/2017   Procedure: UPPER ENDOSCOPIC ULTRASOUND (EUS) RADIAL;  Surgeon: Cedar Toribio SHAUNNA, MD;  Location: [facility];  Service: Endoscopy;  Laterality: N/A;   HOT HEMOSTASIS  05/19/2022   Procedure: HOT HEMOSTASIS (ARGON PLASMA COAGULATION/BICAP);  Surgeon: Cindie Carlin POUR, DO;  Location: [facility];  Service: Endoscopy;;   IR IMAGING GUIDED PORT INSERTION  01/20/2022   TUMOR REMOVAL  2005   Benign   UPPER ESOPHAGEAL ENDOSCOPIC ULTRASOUND (EUS)  09/13/2015   Procedure: UPPER ESOPHAGEAL ENDOSCOPIC ULTRASOUND (EUS);  Surgeon: Toribio SHAUNNA Cedar, MD;  Location: [facility];  Service: Endoscopy;;   VIDEO BRONCHOSCOPY WITH  ENDOBRONCHIAL NAVIGATION N/A 12/31/2015   Procedure: VIDEO BRONCHOSCOPY WITH ENDOBRONCHIAL NAVIGATION;  Surgeon: Elspeth JAYSON Millers, MD;  Location: [facility] OR;  Service: Thoracic;  Laterality: N/A;   VIDEO BRONCHOSCOPY WITH ENDOBRONCHIAL ULTRASOUND N/A 11/08/2015   Procedure: VIDEO BRONCHOSCOPY WITH ENDOBRONCHIAL ULTRASOUND;  Surgeon: Maude Fleeta Ochoa, MD;  Location: [facility] OR;  Service: Thoracic;  Laterality: N/A;    Social History:  reports that she quit smoking about 5 years ago. Her smoking use included cigarettes. She started smoking about 43 years ago. She has a 38 pack-year smoking history. She has never used smokeless tobacco. She reports that she does not drink alcohol and does not use drugs.   Allergies  Allergen Reactions   Desyrel  [Trazodone ] Other (See Comments)    Made me faint    Family History  Problem Relation Age of Onset   Ovarian cancer Mother    Lung cancer Father    Brain cancer Sister        Half sister   Lung cancer Brother    Lung cancer Brother    Prostate cancer Brother    Colon cancer Neg Hx    Colon polyps Neg Hx      Prior to Admission medications   Medication Sig Start Date End Date Taking? Authorizing Provider  albuterol  (PROVENTIL ) (2.5 MG/3ML) 0.083% nebulizer solution Take 3 mLs (2.5 mg total) by nebulization 3 (three) times daily as needed for shortness of breath or wheezing. 08/28/22   Pearlean Manus, MD  albuterol  (VENTOLIN  HFA) 108 (90 Base) MCG/ACT inhaler Inhale 2 puffs into the lungs every 4 (four) hours as needed for shortness of breath. 08/28/22   Pearlean Manus, MD  Aspirin -Salicylamide-Caffeine  (BC HEADACHE POWDER PO) Take 1 packet by mouth as needed (headache).    [provider]  benzonatate (TESSALON) 100 MG capsule Take 100 mg by mouth 3 (three) times daily as needed for cough. 05/22/23   [provider]  bisoprolol  (ZEBETA ) 5 MG tablet Take 0.5 tablets (2.5 mg total) by mouth daily. 09/22/23   Johnson, Clanford L, MD   docusate sodium  (COLACE) 100 MG capsule Take 1 capsule (100 mg total) by mouth 2 (two) times daily. 08/12/23 08/11/24  Maree, Pratik D, DO  ENSURE (ENSURE) Take 237 mLs by mouth 3 (three) times daily between meals.    [provider]  ferrous sulfate  325 (65 FE) MG tablet Take 1 tablet (325 mg total) by mouth daily with breakfast. 08/13/23 09/18/23  Maree, Pratik D, DO  Fluticasone -Umeclidin-Vilant (TRELEGY ELLIPTA ) 100-62.5-25 MCG/ACT AEPB INHALE 1 PUFF BY MOUTH EVERY DAY    [provider]  furosemide  (LASIX ) 20 MG tablet Take 1 tablet (20 mg total) by mouth every Monday, Wednesday, and Friday. 09/23/23   Johnson, Clanford L, MD  guaiFENesin -dextromethorphan  (ROBITUSSIN DM) 100-10 MG/5ML syrup Take 5 mLs by mouth every 4 (four) hours as needed for cough. 09/22/23   Johnson, Clanford L, MD  Ipratropium-Albuterol  (COMBIVENT  RESPIMAT) 20-100 MCG/ACT AERS respimat Inhale 1 puff into the lungs  every 6 (six) hours as needed for wheezing or shortness of breath. 08/28/22   Pearlean Manus, MD  oxyCODONE -acetaminophen  (PERCOCET/ROXICET) 5-325 MG tablet Take 1 tablet by mouth every 8 (eight) hours as needed for moderate pain. 05/08/18   Pearlean Manus, MD  pantoprazole  (PROTONIX ) 40 MG tablet Take 1 tablet (40 mg total) by mouth 2 (two) times daily. 08/28/22 09/18/23  Pearlean Manus, MD  polyethylene glycol (MIRALAX ) 17 g packet Take 17 g by mouth daily. 10/20/23   Kommor, Lum, MD  predniSONE  (DELTASONE ) 20 MG tablet Take 3 PO QAM x5days, 2 PO QAM x5days, 1 PO QAM x5days 09/23/23   Johnson, Clanford L, MD  traZODone  (DESYREL ) 50 MG tablet Take 1 tablet (50 mg total) by mouth at bedtime. 08/28/22   Pearlean Manus, MD  HYDROcodone  bit-homatropine (HYCODAN) 5-1.5 MG/5ML syrup Take 5 mLs by mouth every 6 (six) hours as needed for cough. 11/10/21   Sherrod Sherrod, MD    Physical Exam: BP 108/68   Pulse (!) 110   Temp 98.8 F (37.1 C) (Oral)   Resp (!) 23   SpO2 97%   General: 73 y.o. year-old  female well developed well nourished in no acute distress.  Alert and oriented x3. HEENT: NCAT, EOMI Neck: Supple, trachea medial Cardiovascular: Regular rate and rhythm with no rubs or gallops.  No thyromegaly noted.  No lower extremity edema. 2/4 pulses in all 4 extremities. Respiratory: Increased work of breathing at bedside with decreased breath sounds.  Scattered expiratory wheezes. Abdomen: Soft, nontender nondistended with normal bowel sounds x4 quadrants. Muskuloskeletal: No cyanosis, clubbing or edema noted bilaterally Neuro: CN II-XII intact, strength 5/5 x 4, sensation, reflexes intact Skin: No ulcerative lesions noted or rashes Psychiatry: Judgement and insight appear normal. Mood is appropriate for condition and setting          Labs on Admission:  Basic Metabolic Panel: Recent Labs  Lab 10/20/23 1804 10/24/23 2038  NA 141 138  K 3.8 4.1  CL 100 94*  CO2 32 32  GLUCOSE 117* 107*  BUN 11 12  CREATININE 0.46 0.51  CALCIUM  8.4* 8.8*   Liver Function Tests: Recent Labs  Lab 10/20/23 1804 10/24/23 2038  AST 18 19  ALT 23 19  ALKPHOS 188* 187*  BILITOT 0.2 0.2  PROT 6.0* 6.5  ALBUMIN  3.3* 3.3*   No results for input(s): LIPASE, AMYLASE in the last 168 hours. No results for input(s): AMMONIA in the last 168 hours. CBC: Recent Labs  Lab 10/20/23 1804 10/24/23 2038  WBC 7.7 7.8  NEUTROABS 6.4 6.5  HGB 7.8* 8.0*  HCT 25.9* 26.9*  MCV 77.8* 78.0*  PLT 377 408*   Cardiac Enzymes: No results for input(s): CKTOTAL, CKMB, CKMBINDEX, TROPONINI in the last 168 hours.  BNP (last 3 results) Recent Labs    08/06/23 1103 08/10/23 0444 10/20/23 1805  BNP 111.2* 354.0* 599.0*    ProBNP (last 3 results) No results for input(s): PROBNP in the last 8760 hours.  CBG: No results for input(s): GLUCAP in the last 168 hours.  Radiological Exams on Admission: DG Chest Port 1 View Result Date: 10/24/2023 CLINICAL DATA:  Chest pain and shortness  of breath EXAM: PORTABLE CHEST 1 VIEW COMPARISON:  Chest x-ray 09/17/2023.  CT chest 08/04/2023 FINDINGS: Right chest port catheter tip ends in the distal SVC. The lungs are hyperinflated compatible with emphysema, unchanged. Increased patchy airspace opacities identified in the left lower lobe. Focal opacity in the right mid lung and linear atelectasis or  scarring in the right lung base persists. Spiculated ill-defined nodular density in the right upper lobe appears unchanged. Right chest port catheter tip ends in the distal SVC. The heart is mildly enlarged. There is no pleural effusion or pneumothorax. IMPRESSION: 1. New airspace opacities in the left lower lobe, suspicious for pneumonia. 2. Stable focal opacity in the right mid lung and linear atelectasis or scarring in the right lung base. 3. Stable spiculated ill-defined nodular density in the right upper lobe. 4. Emphysema. Electronically Signed   By: Greig Pique M.D.   On: 10/24/2023 21:04    EKG: I independently viewed the EKG done and my findings are as followed: Ventricular tachycardia, unsustained at a rate of 112 ms  Assessment/Plan Present on Admission:  CAP (community acquired pneumonia)  COPD exacerbation (HCC)  Acute on chronic respiratory failure with hypoxia (HCC)  Essential hypertension  Adenocarcinoma of right lung, stage 4  Iron  deficiency anemia  Principal Problem:   CAP (community acquired pneumonia) Active Problems:   COPD exacerbation (HCC)   Essential hypertension   Iron  deficiency anemia   Adenocarcinoma of right lung, stage 4   Acute on chronic respiratory failure with hypoxia (HCC)   Rectal bleeding   Microcytic anemia   Hypoalbuminemia due to protein-calorie malnutrition (HCC)  CAP POA Chest x-ray was suspicious for pneumonia Patient was started on ceftriaxone  and azithromycin , we shall continue same at this time with plan to de-escalate/discontinue based on blood culture, sputum culture, urine Legionella,  strep pneumo and procalcitonin Continue Tylenol  as needed Continue Mucinex , incentive spirometry, flutter valve   Acute exacerbation of COPD Acute on chronic respiratory failure with hypoxia  Continue Xopenex , Atrovent , Mucinex , Solu-Medrol , azithromycin . Continue Protonix  to prevent steroid-induced ulcer Continue incentive spirometry and flutter valve Continue supplemental oxygen  to maintain O2 sat > 94%   Rectal bleeding H/H= 8/26.9, this was 7.8/25.9 on 10/20/2023 Hemoccult was positive Patient endorsed taking ferrous sulfate  for iron  deficiency anemia Continue IV Protonix  40 mg twice daily Gastroenterologist  will be consulted in the morning  Microcytic anemia Iron  deficiency anemia MCV 78, H/H 8/26.9 Iron  studies will be checked  Hypoalbuminemia possibly secondary to mild protein calorie malnutrition Albumin  3.3, protein supplement will be provided  Essential hypertension Patient was not taking bisoprolol  Continue blood pressure monitoring  Adenocarcinoma of right lung, stage 4 Patient follows up as outpatient with oncology (Dr. Gatha)   DVT prophylaxis: SCDs (consider starting patient on chemoprophylaxis if no indication for GI intervention)  Code Status: Full code  Family Communication: None at bedside  Consults: None   Severity of Illness: The appropriate patient status for this patient is INPATIENT. Inpatient status is judged to be reasonable and necessary in order to provide the required intensity of service to ensure the patient's safety. The patient's presenting symptoms, physical exam findings, and initial radiographic and laboratory data in the context of their chronic comorbidities is felt to place them at high risk for further clinical deterioration. Furthermore, it is not anticipated that the patient will be medically stable for discharge from the hospital within 2 midnights of admission.   * I certify that at the point of admission it is my clinical  judgment that the patient will require inpatient hospital care spanning beyond 2 midnights from the point of admission due to high intensity of service, high risk for further deterioration and high frequency of surveillance required.*  Author: Mariavictoria Nottingham, DO 10/25/2023 2:06 AM  For on call review [email or web address].

## 2023-10-24 NOTE — ED Provider Notes (Signed)
 [place] EMERGENCY DEPARTMENT AT [facility] Provider Note   CSN: [id number] Arrival date & time: 10/24/23  2023     Patient presents with: Chest Pain and Leg Swelling   Courtney Zuniga is a 73 y.o. female.    Chest Pain  This patient is a 73 year old female, she has a history of COPD and has frequent admissions to the hospital about once a month or every other month.  She also has been diagnosed with lung cancer on chemotherapy, she denies any cardiac disease.  She presents with a complaint of left-sided chest pain with progressive shortness of breath over the last couple of days.  She is on 4 L by nasal cannula chronically.  She has had some dark stools and has been informed by her home health nurse that this may be related to blood in the stool    Prior to Admission medications   Medication Sig Start Date End Date Taking? Authorizing Provider  albuterol  (PROVENTIL ) (2.5 MG/3ML) 0.083% nebulizer solution Take 3 mLs (2.5 mg total) by nebulization 3 (three) times daily as needed for shortness of breath or wheezing. 08/28/22   Pearlean Manus, MD  albuterol  (VENTOLIN  HFA) 108 (90 Base) MCG/ACT inhaler Inhale 2 puffs into the lungs every 4 (four) hours as needed for shortness of breath. 08/28/22   Pearlean Manus, MD  Aspirin -Salicylamide-Caffeine  (BC HEADACHE POWDER PO) Take 1 packet by mouth as needed (headache).    [provider]  benzonatate (TESSALON) 100 MG capsule Take 100 mg by mouth 3 (three) times daily as needed for cough. 05/22/23   [provider]  bisoprolol  (ZEBETA ) 5 MG tablet Take 0.5 tablets (2.5 mg total) by mouth daily. 09/22/23   Johnson, Clanford L, MD  docusate sodium  (COLACE) 100 MG capsule Take 1 capsule (100 mg total) by mouth 2 (two) times daily. 08/12/23 08/11/24  Maree, Pratik D, DO  ENSURE (ENSURE) Take 237 mLs by mouth 3 (three) times daily between meals.    [provider]  ferrous sulfate  325 (65 FE) MG tablet Take 1 tablet  (325 mg total) by mouth daily with breakfast. 08/13/23 09/18/23  Maree, Pratik D, DO  Fluticasone -Umeclidin-Vilant (TRELEGY ELLIPTA ) 100-62.5-25 MCG/ACT AEPB INHALE 1 PUFF BY MOUTH EVERY DAY    [provider]  furosemide  (LASIX ) 20 MG tablet Take 1 tablet (20 mg total) by mouth every Monday, Wednesday, and Friday. 09/23/23   Johnson, Clanford L, MD  guaiFENesin -dextromethorphan  (ROBITUSSIN DM) 100-10 MG/5ML syrup Take 5 mLs by mouth every 4 (four) hours as needed for cough. 09/22/23   Johnson, Clanford L, MD  Ipratropium-Albuterol  (COMBIVENT  RESPIMAT) 20-100 MCG/ACT AERS respimat Inhale 1 puff into the lungs every 6 (six) hours as needed for wheezing or shortness of breath. 08/28/22   Pearlean Manus, MD  oxyCODONE -acetaminophen  (PERCOCET/ROXICET) 5-325 MG tablet Take 1 tablet by mouth every 8 (eight) hours as needed for moderate pain. 05/08/18   Pearlean Manus, MD  pantoprazole  (PROTONIX ) 40 MG tablet Take 1 tablet (40 mg total) by mouth 2 (two) times daily. 08/28/22 09/18/23  Pearlean Manus, MD  polyethylene glycol (MIRALAX ) 17 g packet Take 17 g by mouth daily. 10/20/23   Kommor, Lum, MD  predniSONE  (DELTASONE ) 20 MG tablet Take 3 PO QAM x5days, 2 PO QAM x5days, 1 PO QAM x5days 09/23/23   Johnson, Clanford L, MD  traZODone  (DESYREL ) 50 MG tablet Take 1 tablet (50 mg total) by mouth at bedtime. 08/28/22   Pearlean Manus, MD  HYDROcodone  bit-homatropine (HYCODAN) 5-1.5 MG/5ML syrup  Take 5 mLs by mouth every 6 (six) hours as needed for cough. 11/10/21   Sherrod Sherrod, MD    Allergies: Desyrel  [trazodone ]    Review of Systems  Cardiovascular:  Positive for chest pain.  All other systems reviewed and are negative.   Updated Vital Signs BP 101/72   Pulse (!) 110   Temp 98.8 F (37.1 C) (Oral)   Resp (!) 29   SpO2 100%   Physical Exam Vitals and nursing note reviewed.  Constitutional:      General: She is in acute distress.     Appearance: She is well-developed.  HENT:     Head:  Normocephalic and atraumatic.     Mouth/Throat:     Pharynx: No oropharyngeal exudate.  Eyes:     General: No scleral icterus.       Right eye: No discharge.        Left eye: No discharge.     Conjunctiva/sclera: Conjunctivae normal.     Pupils: Pupils are equal, round, and reactive to light.  Neck:     Thyroid : No thyromegaly.     Vascular: No JVD.  Cardiovascular:     Rate and Rhythm: Normal rate and regular rhythm.     Heart sounds: Normal heart sounds. No murmur heard.    No friction rub. No gallop.     Comments: No tenderness over the chest wall, Port-A-Cath in the right chest Pulmonary:     Effort: Respiratory distress present.     Breath sounds: Wheezing present. No rales.     Comments: Increased work of breathing, increased expiratory phase Abdominal:     General: Bowel sounds are normal. There is no distension.     Palpations: Abdomen is soft. There is no mass.     Tenderness: There is no abdominal tenderness.  Musculoskeletal:        General: No tenderness. Normal range of motion.     Cervical back: Normal range of motion and neck supple.     Right lower leg: Edema present.     Left lower leg: Edema present.     Comments: Edema of the bilateral ankles  Lymphadenopathy:     Cervical: No cervical adenopathy.  Skin:    General: Skin is warm and dry.     Findings: No erythema or rash.  Neurological:     Mental Status: She is alert.     Coordination: Coordination normal.  Psychiatric:        Behavior: Behavior normal.     (all labs ordered are listed, but only abnormal results are displayed) Labs Reviewed  CBC WITH DIFFERENTIAL/PLATELET - Abnormal; Notable for the following components:      Result Value   RBC 3.45 (*)    Hemoglobin 8.0 (*)    HCT 26.9 (*)    MCV 78.0 (*)    MCH 23.2 (*)    MCHC 29.7 (*)    RDW 19.9 (*)    Platelets 408 (*)    nRBC 0.8 (*)    Lymphs Abs 0.6 (*)    All other components within normal limits  COMPREHENSIVE METABOLIC PANEL  WITH GFR - Abnormal; Notable for the following components:   Chloride 94 (*)    Glucose, Bld 107 (*)    Calcium  8.8 (*)    Albumin  3.3 (*)    Alkaline Phosphatase 187 (*)    All other components within normal limits  POC OCCULT BLOOD, ED - Abnormal  TROPONIN I (HIGH SENSITIVITY)  TROPONIN I (HIGH SENSITIVITY)    EKG: None  Radiology: [facility] Chest Port 1 View Result Date: 10/24/2023 CLINICAL DATA:  Chest pain and shortness of breath EXAM: PORTABLE CHEST 1 VIEW COMPARISON:  Chest x-ray 09/17/2023.  CT chest 08/04/2023 FINDINGS: Right chest port catheter tip ends in the distal SVC. The lungs are hyperinflated compatible with emphysema, unchanged. Increased patchy airspace opacities identified in the left lower lobe. Focal opacity in the right mid lung and linear atelectasis or scarring in the right lung base persists. Spiculated ill-defined nodular density in the right upper lobe appears unchanged. Right chest port catheter tip ends in the distal SVC. The heart is mildly enlarged. There is no pleural effusion or pneumothorax. IMPRESSION: 1. New airspace opacities in the left lower lobe, suspicious for pneumonia. 2. Stable focal opacity in the right mid lung and linear atelectasis or scarring in the right lung base. 3. Stable spiculated ill-defined nodular density in the right upper lobe. 4. Emphysema. Electronically Signed   By: Greig Pique M.D.   On: 10/24/2023 21:04     Procedures   Medications Ordered in the ED  albuterol  (PROVENTIL ) (2.5 MG/3ML) 0.083% nebulizer solution 3 mL (has no administration in time range)  ipratropium-albuterol  (DUONEB) 0.5-2.5 (3) MG/3ML nebulizer solution 3 mL (has no administration in time range)  azithromycin  (ZITHROMAX ) 500 mg in sodium chloride  0.9 % 250 mL IVPB (500 mg Intravenous New Bag/Given 10/24/23 2156)  cefTRIAXone  (ROCEPHIN ) 1 g in sodium chloride  0.9 % 100 mL IVPB (1 g Intravenous New Bag/Given 10/24/23 2156)                                     Medical Decision Making Amount and/or Complexity of Data Reviewed Labs: ordered. Radiology: ordered. ECG/medicine tests: ordered.  Risk Prescription drug management. Decision regarding hospitalization.    This patient presents to the ED for concern of shortness of breath and left-sided chest pain, this involves an extensive number of treatment options, and is a complaint that carries with it a high risk of complications and morbidity.  The differential diagnosis includes pleurisy, pneumothorax, pneumonia, cardiac disease, pneumonia, GI bleed   Co morbidities / Chronic conditions that complicate the patient evaluation  Severe COPD, lung cancer   Additional history obtained:  Additional history obtained from EMR External records from outside source obtained and reviewed including prior admissions to the hospital including a month ago as she was admitted for COPD   Lab Tests:  I Ordered, and personally interpreted labs.  The pertinent results include: Anemia, hemoglobin of 8.0 consistent with prior values.  Platelets are 408,000, white blood cells normal, metabolic panel does show a slight low chloride and an albumin  of 3.3, troponin is 12   Imaging Studies ordered:  I ordered imaging studies including chest x-ray I independently visualized and interpreted imaging which showed emphysema but superimposed airspace opacity in the left lower lobe suspicious for pneumonia I agree with the radiologist interpretation   Cardiac Monitoring: / EKG:  The patient was maintained on a cardiac monitor.  I personally viewed and interpreted the cardiac monitored which showed an underlying rhythm of: Sinus tachycardia   Problem List / ED Course / Critical interventions / Medication management  The patient was significantly short of breath, wheezing and had a prolonged expiratory phase with decreased air movement and required nebulizer therapy, the pneumonia seen on the x-ray was treated  with antibiotics including  Rocephin  and Zithromax , she was Hemoccult positive from below. I did review the prior medical record including an endoscopy from February 2024.  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 I ordered medication including antibiotics as above Thankfully on oxygen  the patient is not terribly hypoxic although still very tachypneic and feeling short of breath. Reevaluation of the patient after these medicines showed that the patient proved after breathing treatment but still tachypneic I have reviewed the patients home medicines and have made adjustments as needed   Consultations Obtained:  I requested consultation with the hospitalist,  and discussed lab and imaging findings as well as pertinent plan - they recommend: Admission to the hospital   Social Determinants of Health:  Chronic lung disease, lung cancer, new pneumonia   Test / Admission - Considered:  Admit to hospital      Final diagnoses:  COPD exacerbation (HCC)  Anemia, unspecified type  Rectal bleeding    ED Discharge Orders     None          Cleotilde Rogue, MD 10/24/23 2235

## 2023-10-25 ENCOUNTER — Other Ambulatory Visit: Payer: Self-pay

## 2023-10-25 DIAGNOSIS — D649 Anemia, unspecified: Secondary | ICD-10-CM

## 2023-10-25 DIAGNOSIS — R195 Other fecal abnormalities: Secondary | ICD-10-CM

## 2023-10-25 DIAGNOSIS — Z8719 Personal history of other diseases of the digestive system: Secondary | ICD-10-CM | POA: Diagnosis not present

## 2023-10-25 DIAGNOSIS — Z8711 Personal history of peptic ulcer disease: Secondary | ICD-10-CM

## 2023-10-25 DIAGNOSIS — J9620 Acute and chronic respiratory failure, unspecified whether with hypoxia or hypercapnia: Secondary | ICD-10-CM | POA: Diagnosis present

## 2023-10-25 DIAGNOSIS — E46 Unspecified protein-calorie malnutrition: Secondary | ICD-10-CM | POA: Insufficient documentation

## 2023-10-25 DIAGNOSIS — D509 Iron deficiency anemia, unspecified: Secondary | ICD-10-CM | POA: Diagnosis not present

## 2023-10-25 DIAGNOSIS — J189 Pneumonia, unspecified organism: Secondary | ICD-10-CM | POA: Diagnosis not present

## 2023-10-25 DIAGNOSIS — K625 Hemorrhage of anus and rectum: Secondary | ICD-10-CM | POA: Insufficient documentation

## 2023-10-25 LAB — COMPREHENSIVE METABOLIC PANEL WITH GFR
ALT: 20 U/L (ref 0–44)
AST: 20 U/L (ref 15–41)
Albumin: 3 g/dL — ABNORMAL LOW (ref 3.5–5.0)
Alkaline Phosphatase: 168 U/L — ABNORMAL HIGH (ref 38–126)
Anion gap: 11 (ref 5–15)
BUN: 20 mg/dL (ref 8–23)
CO2: 32 mmol/L (ref 22–32)
Calcium: 8.4 mg/dL — ABNORMAL LOW (ref 8.9–10.3)
Chloride: 93 mmol/L — ABNORMAL LOW (ref 98–111)
Creatinine, Ser: 0.38 mg/dL — ABNORMAL LOW (ref 0.44–1.00)
GFR, Estimated: >60 mL/min (ref >60)
Glucose, Bld: 111 mg/dL — ABNORMAL HIGH (ref 70–99)
Potassium: 4.1 mmol/L (ref 3.5–5.1)
Sodium: 136 mmol/L (ref 135–145)
Total Bilirubin: 0.5 mg/dL (ref 0.0–1.2)
Total Protein: 5.9 g/dL — ABNORMAL LOW (ref 6.5–8.1)

## 2023-10-25 LAB — IRON AND TIBC
Iron: 7 ug/dL — ABNORMAL LOW (ref 28–170)
Saturation Ratios: 2 % — ABNORMAL LOW (ref 10.4–31.8)
TIBC: 358 ug/dL (ref 250–450)
UIBC: 351 ug/dL

## 2023-10-25 LAB — MAGNESIUM: Magnesium: 1.7 mg/dL (ref 1.7–2.4)

## 2023-10-25 LAB — PHOSPHORUS: Phosphorus: 3.7 mg/dL (ref 2.5–4.6)

## 2023-10-25 LAB — PREPARE RBC (CROSSMATCH)

## 2023-10-25 LAB — CBC
HCT: 25.3 % — ABNORMAL LOW (ref 36.0–46.0)
Hemoglobin: 7.4 g/dL — ABNORMAL LOW (ref 12.0–15.0)
MCH: 22.8 pg — ABNORMAL LOW (ref 26.0–34.0)
MCHC: 29.2 g/dL — ABNORMAL LOW (ref 30.0–36.0)
MCV: 77.8 fL — ABNORMAL LOW (ref 80.0–100.0)
Platelets: 376 K/uL (ref 150–400)
RBC: 3.25 MIL/uL — ABNORMAL LOW (ref 3.87–5.11)
RDW: 19.6 % — ABNORMAL HIGH (ref 11.5–15.5)
WBC: 7.7 K/uL (ref 4.0–10.5)
nRBC: 0.8 % — ABNORMAL HIGH (ref 0.0–0.2)

## 2023-10-25 LAB — MRSA NEXT GEN BY PCR, NASAL: MRSA by PCR Next Gen: NOT DETECTED

## 2023-10-25 LAB — HEMOGLOBIN AND HEMATOCRIT, BLOOD
HCT: 25.3 % — ABNORMAL LOW (ref 36.0–46.0)
Hemoglobin: 7.3 g/dL — ABNORMAL LOW (ref 12.0–15.0)

## 2023-10-25 LAB — FERRITIN: Ferritin: 5 ng/mL — ABNORMAL LOW (ref 11–307)

## 2023-10-25 LAB — PROCALCITONIN: Procalcitonin: <0.1 ng/mL

## 2023-10-25 MED ORDER — CHLORHEXIDINE GLUCONATE CLOTH 2 % EX PADS
6.0000 | MEDICATED_PAD | Freq: Every day | CUTANEOUS | Status: DC
  Administered 2023-10-27 – 2023-10-30 (×4): 6 via TOPICAL

## 2023-10-25 MED ORDER — FERROUS SULFATE 325 (65 FE) MG PO TABS
325.0000 mg | ORAL_TABLET | Freq: Three times a day (TID) | ORAL | Status: DC
  Administered 2023-10-26: 325 mg via ORAL
  Filled 2023-10-25: qty 1

## 2023-10-25 MED ORDER — IPRATROPIUM-ALBUTEROL 0.5-2.5 (3) MG/3ML IN SOLN: 3.0000 mL | Freq: Four times a day (QID) | RESPIRATORY_TRACT | Status: DC

## 2023-10-25 MED ORDER — POLYETHYLENE GLYCOL 3350 17 G PO PACK
17.0000 g | PACK | Freq: Two times a day (BID) | ORAL | Status: DC
  Administered 2023-10-25 – 2023-10-27 (×4): 17 g via ORAL
  Filled 2023-10-25 (×4): qty 1

## 2023-10-25 MED ORDER — METHYLPREDNISOLONE SODIUM SUCC 40 MG IJ SOLR
40.0000 mg | Freq: Two times a day (BID) | INTRAMUSCULAR | Status: DC
  Administered 2023-10-25: 40 mg via INTRAVENOUS
  Filled 2023-10-25: qty 1

## 2023-10-25 MED ORDER — IPRATROPIUM-ALBUTEROL 0.5-2.5 (3) MG/3ML IN SOLN: 3.0000 mL | RESPIRATORY_TRACT | Status: DC | PRN

## 2023-10-25 MED ORDER — SODIUM CHLORIDE 0.9 % IV SOLN
2.0000 g | INTRAVENOUS | Status: DC
  Administered 2023-10-25 – 2023-10-26 (×2): 2 g via INTRAVENOUS
  Filled 2023-10-25 (×3): qty 20

## 2023-10-25 MED ORDER — PANTOPRAZOLE SODIUM 40 MG IV SOLR
40.0000 mg | Freq: Two times a day (BID) | INTRAVENOUS | Status: DC
  Administered 2023-10-25 – 2023-10-27 (×6): 40 mg via INTRAVENOUS
  Filled 2023-10-25 (×6): qty 10

## 2023-10-25 MED ORDER — ACETAMINOPHEN 650 MG RE SUPP: 650.0000 mg | Freq: Four times a day (QID) | RECTAL | Status: DC | PRN

## 2023-10-25 MED ORDER — METHYLPREDNISOLONE SODIUM SUCC 125 MG IJ SOLR
80.0000 mg | Freq: Two times a day (BID) | INTRAMUSCULAR | Status: DC
  Administered 2023-10-25 – 2023-10-26 (×2): 80 mg via INTRAVENOUS
  Filled 2023-10-25 (×2): qty 2

## 2023-10-25 MED ORDER — FUROSEMIDE 10 MG/ML IJ SOLN
20.0000 mg | Freq: Two times a day (BID) | INTRAMUSCULAR | Status: DC
  Administered 2023-10-25 (×2): 20 mg via INTRAVENOUS
  Filled 2023-10-25 (×2): qty 2

## 2023-10-25 MED ORDER — LEVALBUTEROL HCL 0.63 MG/3ML IN NEBU
0.6300 mg | INHALATION_SOLUTION | Freq: Four times a day (QID) | RESPIRATORY_TRACT | Status: DC
  Administered 2023-10-25 – 2023-10-28 (×17): 0.63 mg via RESPIRATORY_TRACT
  Filled 2023-10-25 (×16): qty 3

## 2023-10-25 MED ORDER — DM-GUAIFENESIN ER 30-600 MG PO TB12
1.0000 | ORAL_TABLET | Freq: Two times a day (BID) | ORAL | Status: DC
  Administered 2023-10-25 – 2023-10-30 (×12): 1 via ORAL
  Filled 2023-10-25 (×12): qty 1

## 2023-10-25 MED ORDER — SODIUM CHLORIDE 0.9% IV SOLUTION: Freq: Once | INTRAVENOUS | Status: AC

## 2023-10-25 MED ORDER — ONDANSETRON HCL 4 MG PO TABS: 4.0000 mg | ORAL_TABLET | Freq: Four times a day (QID) | ORAL | Status: DC | PRN

## 2023-10-25 MED ORDER — ENSURE PLUS HIGH PROTEIN PO LIQD
237.0000 mL | Freq: Two times a day (BID) | ORAL | Status: DC
  Administered 2023-10-25 – 2023-10-29 (×8): 237 mL via ORAL
  Filled 2023-10-25 (×4): qty 237

## 2023-10-25 MED ORDER — IRON SUCROSE 200 MG IVPB - SIMPLE MED
200.0000 mg | Status: AC
  Administered 2023-10-25 – 2023-10-27 (×3): 200 mg via INTRAVENOUS
  Filled 2023-10-25: qty 200
  Filled 2023-10-25 (×2): qty 110

## 2023-10-25 MED ORDER — ACETAMINOPHEN 325 MG PO TABS
650.0000 mg | ORAL_TABLET | Freq: Four times a day (QID) | ORAL | Status: DC | PRN
  Administered 2023-10-25 – 2023-10-30 (×6): 650 mg via ORAL
  Filled 2023-10-25 (×6): qty 2

## 2023-10-25 MED ORDER — ONDANSETRON HCL 4 MG/2ML IJ SOLN: 4.0000 mg | Freq: Four times a day (QID) | INTRAMUSCULAR | Status: DC | PRN

## 2023-10-25 MED ORDER — SENNOSIDES-DOCUSATE SODIUM 8.6-50 MG PO TABS
2.0000 | ORAL_TABLET | Freq: Every day | ORAL | Status: DC
  Administered 2023-10-25 – 2023-10-29 (×5): 2 via ORAL
  Filled 2023-10-25 (×6): qty 2

## 2023-10-25 MED ORDER — IPRATROPIUM BROMIDE 0.02 % IN SOLN
0.5000 mg | RESPIRATORY_TRACT | Status: DC | PRN
  Administered 2023-10-25 – 2023-10-28 (×5): 0.5 mg via RESPIRATORY_TRACT
  Filled 2023-10-25 (×5): qty 2.5

## 2023-10-25 MED ORDER — FERROUS SULFATE 325 (65 FE) MG PO TABS
325.0000 mg | ORAL_TABLET | Freq: Every day | ORAL | Status: DC
Start: 2023-10-25 — End: 2023-10-25
  Filled 2023-10-25: qty 1

## 2023-10-25 NOTE — Hospital Course (Addendum)
 Courtney Zuniga is a 73 y.o. female with medical history significant of COPD, asthma, chronic respiratory failure with hypoxia on supplemental oxygen  via [place] at 4 LPM, coronary artery disease, depression and lung cancer who presents to the emergency department due to several days onset of left-sided chest pain with progressive shortness of breath and several weeks of nonproductive cough.  She was noted to have black stool and her home health nurse states that this may be due to blood in her stool.  Patient endorsed taking Pepto-Bismol recently.   ED Course: - Tachypneic with respiratory rate of 35/min, tachycardic with HR of 103 bpm, O2 sat 97% on 4 LPM,  WBC 7.8, hemoglobin 8.0, hematocrit 26.9, MCV 78.0, platelets 408.  BMP was normal except for chloride of 94 and blood glucose of 107, albumin  3.3, ALP 187.   troponin x 2 - 12 > 11. FOBT positive,  Chest x-ray showed new airspace opacities in the left lower lobe, suspicious for pneumonia. Stable focal opacity in the right mid lung and linear atelectasis or scarring in the right lung base. Patient was treated with IV ceftriaxone  and azithromycin , breathing treatment with DuoNeb was provided.       Assessment & Plan:   Principal Problem:   CAP (community acquired pneumonia) Active Problems:   COPD exacerbation (HCC)   Essential hypertension   Iron  deficiency anemia   Adenocarcinoma of right lung, stage 4   Acute on chronic respiratory failure with hypoxia (HCC)   Rectal bleeding   Microcytic anemia   Hypoalbuminemia due to protein-calorie malnutrition (HCC)      Acute on chronic respiratory failure due to COPD exacerbation/pneumonia -Continue supplemental oxygen , maintaining O2 sat 88-92% -On 4 L of oxygen , satting 100% -ABG reviewed, pCO2 78--- reducing oxygen  -Continue nebs, IV steroids, mucolytics, IV antibiotics--- titrating down steroids and antibiotics  Community-acquired pneumonia ammonia -Chest x-ray was suspicious for  pneumonia -Was started on ceftriaxone  azithromycin , will continue -Will follow-up with blood cultures, sputum culture, urine Legionella, strep pneumo  -  Procalcitonin <0.10 Continue Mucinex , incentive spirometry, flutter valve    Acute exacerbation of COPD Acute on chronic respiratory failure with hypoxia  Continue Xopenex , Atrovent , Mucinex , Solu-Medrol , azithromycin . Continue Protonix  to prevent steroid-induced ulcer Continue incentive spirometry and flutter valve Continue supplemental oxygen  to maintain O2 sat > 94%    Rectal bleeding-no history of anemia of chronic disease-deficiency anemia Hemoccult was positive    Latest Ref Rng & Units 10/25/2023    9:08 AM 10/25/2023    5:51 AM 10/24/2023    8:38 PM  CBC  WBC 4.0 - 10.5 K/uL  7.7  7.8   Hemoglobin 12.0 - 15.0 g/dL 7.3  7.4  8.0   Hematocrit 36.0 - 46.0 % 25.3  25.3  26.9   Platelets 150 - 400 K/uL  376  408    Patient endorsed taking ferrous sulfate  for iron  deficiency anemia Continue IV Protonix  40 mg twice daily Gastroenterologist consulted-recommending outpatient follow-up and workup   Since patient is tachypneic, tachycardic, hypotensive-  10/25/2023 s/p 2U PRBC blood transfusion    Microcytic anemia-with Sever iron  deficiency anemia Iron /TIBC/Ferritin/ %Sat    Component Value Date/Time   IRON  7 (L) 10/25/2023 0551   TIBC 358 10/25/2023 0551   FERRITIN 5 (L) 10/25/2023 0551   IRONPCTSAT 2 (L) 10/25/2023 0551  - Initiating IV iron  - Supplement p.o. iron  on discharge    Hypoalbuminemia possibly secondary to mild protein calorie malnutrition Albumin  3.3, protein supplement will be provided  Essential hypertension Patient was not taking bisoprolol  Continue blood pressure monitoring   Adenocarcinoma of right lung, stage 4 Patient follows up as outpatient with oncology (Dr. Gatha)

## 2023-10-25 NOTE — ED Notes (Signed)
 While tidying up pt room, observed small bugs around her shoes on the counter. It was determined that they are not in fact bedbugs but perhaps roaches. All of pt belongings were double knotted and bagged up and are in room with pt at this time.

## 2023-10-25 NOTE — Progress Notes (Signed)
 PROGRESS NOTE    Patient: Courtney Zuniga                            PCP: Myra Geni ORN, FNP                    DOB: 01/08/1951            DOA: 10/24/2023 FMW:[id number]             DOS: 10/25/2023, 12:28 PM   LOS: 1 day   Date of Service: The patient was seen and examined on 10/25/2023  Subjective:   The patient was seen and examined this morning. Afebrile with temp of 98.4, tachycardic with pulse 106, tachypneic RR of 30, blood pressure 107/73 Hemoglobin down to 7.3  Brief Narrative:   Courtney Zuniga is a 73 y.o. female with medical history significant of COPD, asthma, chronic respiratory failure with hypoxia on supplemental oxygen  via [place] at 4 LPM, coronary artery disease, depression and lung cancer who presents to the emergency department due to several days onset of left-sided chest pain with progressive shortness of breath and several weeks of nonproductive cough.  She was noted to have black stool and her home health nurse states that this may be due to blood in her stool.  Patient endorsed taking Pepto-Bismol recently.   ED Course: - Tachypneic with respiratory rate of 35/min, tachycardic with HR of 103 bpm, O2 sat 97% on 4 LPM,  WBC 7.8, hemoglobin 8.0, hematocrit 26.9, MCV 78.0, platelets 408.  BMP was normal except for chloride of 94 and blood glucose of 107, albumin  3.3, ALP 187.   troponin x 2 - 12 > 11. FOBT positive,  Chest x-ray showed new airspace opacities in the left lower lobe, suspicious for pneumonia. Stable focal opacity in the right mid lung and linear atelectasis or scarring in the right lung base. Patient was treated with IV ceftriaxone  and azithromycin , breathing treatment with DuoNeb was provided.       Assessment & Plan:   Principal Problem:   CAP (community acquired pneumonia) Active Problems:   COPD exacerbation (HCC)   Essential hypertension   Iron  deficiency anemia   Adenocarcinoma of right lung, stage 4   Acute on chronic respiratory failure with  hypoxia (HCC)   Rectal bleeding   Microcytic anemia   Hypoalbuminemia due to protein-calorie malnutrition (HCC)      Acute on chronic respiratory failure due to COPD exacerbation/pneumonia -Continue supplemental oxygen , maintaining O2 sat 88-92% -Continue nebs, IV steroids, mucolytics, IV antibiotics  Community-acquired pneumonia ammonia -Chest x-ray was suspicious for pneumonia -Was started on ceftriaxone  azithromycin , will continue -Will follow-up with blood cultures, sputum culture, urine Legionella, strep pneumo  -  Procalcitonin <0.10 Continue Mucinex , incentive spirometry, flutter valve    Acute exacerbation of COPD Acute on chronic respiratory failure with hypoxia  Continue Xopenex , Atrovent , Mucinex , Solu-Medrol , azithromycin . Continue Protonix  to prevent steroid-induced ulcer Continue incentive spirometry and flutter valve Continue supplemental oxygen  to maintain O2 sat > 94%    Rectal bleeding-no history of anemia of chronic disease-deficiency anemia Hemoccult was positive    Latest Ref Rng & Units 10/25/2023    9:08 AM 10/25/2023    5:51 AM 10/24/2023    8:38 PM  CBC  WBC 4.0 - 10.5 K/uL  7.7  7.8   Hemoglobin 12.0 - 15.0 g/dL 7.3  7.4  8.0   Hematocrit 36.0 - 46.0 % 25.3  25.3  26.9   Platelets 150 - 400 K/uL  376  408    Patient endorsed taking ferrous sulfate  for iron  deficiency anemia Continue IV Protonix  40 mg twice daily Gastroenterologist consulted, pending evaluation   Since patient is tachypneic, tachycardic, hypotensive-  Will transfuse 1U PRBC blood to keep hemoglobin >  8    Microcytic anemia-with Sever iron  deficiency anemia Iron /TIBC/Ferritin/ %Sat    Component Value Date/Time   IRON  7 (L) 10/25/2023 0551   TIBC 358 10/25/2023 0551   FERRITIN 5 (L) 10/25/2023 0551   IRONPCTSAT 2 (L) 10/25/2023 0551  - Initiating IV iron     Hypoalbuminemia possibly secondary to mild protein calorie malnutrition Albumin  3.3, protein supplement will be  provided   Essential hypertension Patient was not taking bisoprolol  Continue blood pressure monitoring   Adenocarcinoma of right lung, stage 4 Patient follows up as outpatient with oncology (Dr. Gatha)    ----------------------------------------------------------------------------------------------------------------- Nutritional status:  The patient's BMI is: There is no height or weight on file to calculate BMI. I agree with the assessment and plan as outlined   Cultures; Blood Cultures x 2 >> Sputum Culture >>    ------------------------------------------------------------------------------------------------------------------------------------------------  DVT prophylaxis:  SCDs Start: 10/25/23 0124   Code Status:   Code Status: Full Code  Family Communication: No family member present at bedside- -Advance care planning has been discussed.   Admission status:   Status is: Inpatient Remains inpatient appropriate because: Needing breathing treatments, respiratory support   Disposition: From  - home             Planning for discharge in 1-2 days: to   Procedures:   No admission procedures for hospital encounter.   Antimicrobials:  Anti-infectives (From admission, onward)    Start     Dose/Rate Route Frequency Ordered Stop   10/25/23 1000  cefTRIAXone  (ROCEPHIN ) 2 g in sodium chloride  0.9 % 100 mL IVPB        2 g 200 mL/hr over 30 Minutes Intravenous Every 24 hours 10/25/23 0126 10/29/23 0959   10/24/23 2145  cefTRIAXone  (ROCEPHIN ) 1 g in sodium chloride  0.9 % 100 mL IVPB        1 g 200 mL/hr over 30 Minutes Intravenous  Once 10/24/23 2136 10/24/23 2300   10/24/23 2145  azithromycin  (ZITHROMAX ) 500 mg in sodium chloride  0.9 % 250 mL IVPB        500 mg 250 mL/hr over 60 Minutes Intravenous Every 24 hours 10/24/23 2136          Medication:   dextromethorphan -guaiFENesin   1 tablet Oral BID   feeding supplement  237 mL Oral BID BM   [START ON 10/26/2023]  ferrous sulfate   325 mg Oral TID WC   levalbuterol   0.63 mg Nebulization Q6H   methylPREDNISolone  (SOLU-MEDROL ) injection  40 mg Intravenous Q12H   pantoprazole  (PROTONIX ) IV  40 mg Intravenous Q12H   senna-docusate  2 tablet Oral QHS    acetaminophen  **OR** acetaminophen , ipratropium, ondansetron  **OR** ondansetron  (ZOFRAN ) IV   Objective:   Vitals:   10/25/23 1030 10/25/23 1100 10/25/23 1130 10/25/23 1146  BP: 112/62 108/70 107/73   Pulse: (!) 103 (!) 103 (!) 106   Resp: (!) 47 16 (!) 30   Temp:    98.4 F (36.9 C)  TempSrc:    Oral  SpO2: 94% 95% 95%     Intake/Output Summary (Last 24 hours) at 10/25/2023 1228 Last data filed at 10/24/2023 2300 Gross per 24 hour  Intake 350 ml  Output --  Net 350 ml   There were no vitals filed for this visit.   Physical examination:     General:  AAO x 3,  cooperative, no distress;   HEENT:  Normocephalic, PERRL, otherwise with in Normal limits   Neuro:  CNII-XII intact. , normal motor and sensation, reflexes intact   Lungs:   Positive breath sounds diffusely, diffuse wheezing and rhonchi No wheezes / crackles  Cardio:    S1/S2, RRR, No murmure, No Rubs or Gallops   Abdomen:  Soft, non-tender, bowel sounds active all four quadrants, no guarding or peritoneal signs.  Muscular  skeletal:  Limited exam -global generalized weaknesses - in bed, able to move all 4 extremities,   2+ pulses,  symmetric, No pitting edema  Skin:  Dry, warm to touch, negative for any Rashes,  Wounds: Please see nursing documentation        ------------------------------------------------------------------------------------------------------------------    LABs:     Latest Ref Rng & Units 10/25/2023    9:08 AM 10/25/2023    5:51 AM 10/24/2023    8:38 PM  CBC  WBC 4.0 - 10.5 K/uL  7.7  7.8   Hemoglobin 12.0 - 15.0 g/dL 7.3  7.4  8.0   Hematocrit 36.0 - 46.0 % 25.3  25.3  26.9   Platelets 150 - 400 K/uL  376  408       Latest Ref Rng & Units  10/25/2023    5:51 AM 10/24/2023    8:38 PM 10/20/2023    6:04 PM  CMP  Glucose 70 - 99 mg/dL 888  892  882   BUN 8 - 23 mg/dL 20  12  11    Creatinine 0.44 - 1.00 mg/dL 9.61  9.48  9.53   Sodium 135 - 145 mmol/L 136  138  141   Potassium 3.5 - 5.1 mmol/L 4.1  4.1  3.8   Chloride 98 - 111 mmol/L 93  94  100   CO2 22 - 32 mmol/L 32  32  32   Calcium  8.9 - 10.3 mg/dL 8.4  8.8  8.4   Total Protein 6.5 - 8.1 g/dL 5.9  6.5  6.0   Total Bilirubin 0.0 - 1.2 mg/dL 0.5  0.2  0.2   Alkaline Phos 38 - 126 U/L 168  187  188   AST 15 - 41 U/L 20  19  18    ALT 0 - 44 U/L 20  19  23         Micro Results Recent Results (from the past 240 hours)  Culture, blood (routine x 2) Call MD if unable to obtain prior to antibiotics being given     Status: None (Preliminary result)   Collection Time: 10/25/23  2:00 AM   Specimen: BLOOD RIGHT HAND  Result Value Ref Range Status   Specimen Description BLOOD RIGHT HAND  Final   Special Requests   Final    BOTTLES DRAWN AEROBIC AND ANAEROBIC Blood Culture adequate volume   Culture   Final    NO GROWTH < 12 HOURS Performed at [facility], [address] [place], [place] [address]    Report Status PENDING  Incomplete  Culture, blood (routine x 2) Call MD if unable to obtain prior to antibiotics being given     Status: None (Preliminary result)   Collection Time: 10/25/23  2:06 AM   Specimen: BLOOD LEFT HAND  Result Value Ref Range Status   Specimen Description BLOOD LEFT HAND  Final  Special Requests   Final    BOTTLES DRAWN AEROBIC AND ANAEROBIC Blood Culture adequate volume   Culture   Final    NO GROWTH < 12 HOURS Performed at [facility], [address], [place], [place] [address]    Report Status PENDING  Incomplete    Radiology Reports DG Chest Port 1 View Result Date: 10/24/2023 CLINICAL DATA:  Chest pain and shortness of breath EXAM: PORTABLE CHEST 1 VIEW COMPARISON:  Chest x-ray 09/17/2023.  CT chest 08/04/2023 FINDINGS: Right chest port  catheter tip ends in the distal SVC. The lungs are hyperinflated compatible with emphysema, unchanged. Increased patchy airspace opacities identified in the left lower lobe. Focal opacity in the right mid lung and linear atelectasis or scarring in the right lung base persists. Spiculated ill-defined nodular density in the right upper lobe appears unchanged. Right chest port catheter tip ends in the distal SVC. The heart is mildly enlarged. There is no pleural effusion or pneumothorax. IMPRESSION: 1. New airspace opacities in the left lower lobe, suspicious for pneumonia. 2. Stable focal opacity in the right mid lung and linear atelectasis or scarring in the right lung base. 3. Stable spiculated ill-defined nodular density in the right upper lobe. 4. Emphysema. Electronically Signed   By: Greig Pique M.D.   On: 10/24/2023 21:04    SIGNED: Adriana DELENA Grams, MD, FHM. FAAFP. Jolynn Pack - [facility] Time spent - 55 min.  In seeing, evaluating and examining the patient. Reviewing medical records, labs, drawn plan of care. [facility],  Pager (please use [email or web address] to page/ text) Please use Epic Secure Chat for non-urgent communication (7AM-7PM)  If 7PM-7AM, please contact night-coverage [email or web address], 10/25/2023, 12:28 PM

## 2023-10-25 NOTE — Progress Notes (Signed)
 RT was called to patient room after getting to go to bathroom and became winded. Patient has received a unit of blood earlier. Patient saturations were in upper 90s but patient RR were in the 30s upon entering room. RT placed patient on BIPAP after explain the need to help with the SOB. RN gave Lasix  as well at this time. Patient is resting comfortably on BIPAP;settings are on flow chart.

## 2023-10-25 NOTE — Consult Note (Signed)
 Sanvi Ehler Faizan Joliet Mallozzi, M.D. Gastroenterology & Hepatology                                           Patient Name: Courtney Zuniga Account #: @FLAACCTNO @   MRN: [id number] Admission Date: 10/24/2023 Date of Evaluation:  10/25/2023 Time of Evaluation: 1:39 PM   Chief Complaint:  Shortness of breath and black colour stools   HPI:  This is a 73 y.o. female with metastatic non-small lung cancer currently on surveillance, chronic respiratory failure due to COPD on continuous home oxygen , GERD, hypertension, coronary artery disease, NSTEMI, aortic regurgitation and stenosis, peptic ulcer disease and angioectasia , Chronic iron  deficiency anemia presented with chest pain and progressive shortness of breath with cough.  GI is consulted for iron  deficiency anemia, black-colored stools with history of peptic ulcer disease  Patient was last seen as inpatient by GI 05/2023 for anemia and concerns of melena, we are at that time treated conservatively  Patient was previously taking BC powders up to 6 times a day but currently denies taking any of them.  Patient reports she came into the hospital because of shortness of breath and has noticed the stools are black in color but formed.  Takes Pepto-Bismol and iron  supplementation.  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     Colonoscopy 2010: -Poor bowel prep, small polyp could be missed  Past Medical History: SEE CHRONIC ISSSUES: Past Medical History:  Diagnosis Date   Anemia    as a young woman   Arthritis    osteoartritis   Asthma    Brain tumor (benign) (HCC) 2005 [facility]   Benign   Chronic headaches    Chronic hip pain    Chronic pain    COPD (chronic obstructive  pulmonary disease) (HCC)    Coronary artery disease    Depression    Depression 05/15/2016   Encounter for antineoplastic chemotherapy 01/10/2016   GERD (gastroesophageal reflux disease)    Hypertension    Lung cancer (HCC) dx'd 01/2016   currently on chemo and radiation    NSTEMI (non-ST elevated myocardial infarction) (HCC) yrs ago   On home O2    qhs 2 liters at hs and prn   Pneumonia last time 2 yrs ago   Shortness of breath dyspnea    with activity   Past Surgical History:  Past Surgical History:  Procedure Laterality Date   CHOLECYSTECTOMY     COLONOSCOPY  2015   Results requested from [facility]   COLONOSCOPY     ESOPHAGOGASTRODUODENOSCOPY N/A 08/14/2015   Procedure: ESOPHAGOGASTRODUODENOSCOPY (EGD);  Surgeon: Lamar CHRISTELLA Hollingshead, MD;  Location: [facility];  Service: Endoscopy;  Laterality: N/A;  215    ESOPHAGOGASTRODUODENOSCOPY (EGD) WITH PROPOFOL  N/A 09/13/2015   Procedure: ESOPHAGOGASTRODUODENOSCOPY (EGD) WITH PROPOFOL ;  Surgeon: Toribio SHAUNNA Cedar, MD;  Location: [facility];  Service: Endoscopy;  Laterality: N/A;   ESOPHAGOGASTRODUODENOSCOPY (EGD) WITH PROPOFOL  N/A 05/19/2022   Procedure: ESOPHAGOGASTRODUODENOSCOPY (EGD) WITH PROPOFOL ;  Surgeon: Cindie Carlin POUR, DO;  Location: [facility];  Service: Endoscopy;  Laterality: N/A;   EUS N/A 03/12/2017   Procedure: UPPER ENDOSCOPIC ULTRASOUND (  EUS) RADIAL;  Surgeon: Teressa Toribio SQUIBB, MD;  Location: [facility];  Service: Endoscopy;  Laterality: N/A;   HOT HEMOSTASIS  05/19/2022   Procedure: HOT HEMOSTASIS (ARGON PLASMA COAGULATION/BICAP);  Surgeon: Cindie Carlin POUR, DO;  Location: [facility];  Service: Endoscopy;;   IR IMAGING GUIDED PORT INSERTION  01/20/2022   TUMOR REMOVAL  2005   Benign   UPPER ESOPHAGEAL ENDOSCOPIC ULTRASOUND (EUS)  09/13/2015   Procedure: UPPER ESOPHAGEAL ENDOSCOPIC ULTRASOUND (EUS);  Surgeon: Toribio SQUIBB Teressa, MD;  Location: [facility];  Service: Endoscopy;;   VIDEO BRONCHOSCOPY WITH  ENDOBRONCHIAL NAVIGATION N/A 12/31/2015   Procedure: VIDEO BRONCHOSCOPY WITH ENDOBRONCHIAL NAVIGATION;  Surgeon: Elspeth JAYSON Millers, MD;  Location: [facility] OR;  Service: Thoracic;  Laterality: N/A;   VIDEO BRONCHOSCOPY WITH ENDOBRONCHIAL ULTRASOUND N/A 11/08/2015   Procedure: VIDEO BRONCHOSCOPY WITH ENDOBRONCHIAL ULTRASOUND;  Surgeon: Maude Fleeta Ochoa, MD;  Location: [facility] OR;  Service: Thoracic;  Laterality: N/A;   Family History:  Family History  Problem Relation Age of Onset   Ovarian cancer Mother    Lung cancer Father    Brain cancer Sister        Half sister   Lung cancer Brother    Lung cancer Brother    Prostate cancer Brother    Colon cancer Neg Hx    Colon polyps Neg Hx    Social History:  Social History   Tobacco Use   Smoking status: Former    Current packs/day: 0.00    Average packs/day: 1 pack/day for 38.0 years (38.0 ttl pk-yrs)    Types: Cigarettes    Start date: 07/25/1980    Quit date: 07/26/2018    Years since quitting: 5.2   Smokeless tobacco: Never   Tobacco comments:    Pt has asked doctor for med to help   Vaping Use   Vaping status: Never Used  Substance Use Topics   Alcohol use: No    Alcohol/week: 0.0 standard drinks of alcohol   Drug use: No    Home Medications:  Prior to Admission medications   Medication Sig Start Date End Date Taking? Authorizing Provider  albuterol  (PROVENTIL ) (2.5 MG/3ML) 0.083% nebulizer solution Take 3 mLs (2.5 mg total) by nebulization 3 (three) times daily as needed for shortness of breath or wheezing. 08/28/22   Pearlean Manus, MD  albuterol  (VENTOLIN  HFA) 108 (90 Base) MCG/ACT inhaler Inhale 2 puffs into the lungs every 4 (four) hours as needed for shortness of breath. 08/28/22   Pearlean Manus, MD  Aspirin -Salicylamide-Caffeine  (BC HEADACHE POWDER PO) Take 1 packet by mouth as needed (headache).    [provider]  benzonatate (TESSALON) 100 MG capsule Take 100 mg by mouth 3 (three) times daily as needed for cough.  05/22/23   [provider]  bisoprolol  (ZEBETA ) 5 MG tablet Take 0.5 tablets (2.5 mg total) by mouth daily. 09/22/23   Johnson, Clanford L, MD  docusate sodium  (COLACE) 100 MG capsule Take 1 capsule (100 mg total) by mouth 2 (two) times daily. 08/12/23 08/11/24  Maree, Pratik D, DO  ENSURE (ENSURE) Take 237 mLs by mouth 3 (three) times daily between meals.    [provider]  ferrous sulfate  325 (65 FE) MG tablet Take 1 tablet (325 mg total) by mouth daily with breakfast. 08/13/23 09/18/23  Maree, Pratik D, DO  Fluticasone -Umeclidin-Vilant (TRELEGY ELLIPTA ) 100-62.5-25 MCG/ACT AEPB INHALE 1 PUFF BY MOUTH EVERY DAY    [provider]  furosemide  (LASIX ) 20 MG tablet Take 1 tablet (20  mg total) by mouth every Monday, Wednesday, and Friday. 09/23/23   Johnson, Clanford L, MD  guaiFENesin -dextromethorphan  (ROBITUSSIN DM) 100-10 MG/5ML syrup Take 5 mLs by mouth every 4 (four) hours as needed for cough. 09/22/23   Johnson, Clanford L, MD  Ipratropium-Albuterol  (COMBIVENT  RESPIMAT) 20-100 MCG/ACT AERS respimat Inhale 1 puff into the lungs every 6 (six) hours as needed for wheezing or shortness of breath. 08/28/22   Pearlean Manus, MD  oxyCODONE -acetaminophen  (PERCOCET/ROXICET) 5-325 MG tablet Take 1 tablet by mouth every 8 (eight) hours as needed for moderate pain. 05/08/18   Pearlean Manus, MD  pantoprazole  (PROTONIX ) 40 MG tablet Take 1 tablet (40 mg total) by mouth 2 (two) times daily. 08/28/22 09/18/23  Pearlean Manus, MD  polyethylene glycol (MIRALAX ) 17 g packet Take 17 g by mouth daily. 10/20/23   Kommor, Lum, MD  predniSONE  (DELTASONE ) 20 MG tablet Take 3 PO QAM x5days, 2 PO QAM x5days, 1 PO QAM x5days 09/23/23   Johnson, Clanford L, MD  traZODone  (DESYREL ) 50 MG tablet Take 1 tablet (50 mg total) by mouth at bedtime. 08/28/22   Pearlean Manus, MD  HYDROcodone  bit-homatropine (HYCODAN) 5-1.5 MG/5ML syrup Take 5 mLs by mouth every 6 (six) hours as needed for cough. 11/10/21   Sherrod Sherrod, MD    Inpatient Medications:  Current Facility-Administered Medications:    0.9 %  sodium chloride  infusion (Manually program via Guardrails IV Fluids), , Intravenous, Once, Shahmehdi, Seyed A, MD   acetaminophen  (TYLENOL ) tablet 650 mg, 650 mg, Oral, Q6H PRN, 650 mg at 10/25/23 0343 **OR** acetaminophen  (TYLENOL ) suppository 650 mg, 650 mg, Rectal, Q6H PRN, Adefeso, Oladapo, DO   azithromycin  (ZITHROMAX ) 500 mg in sodium chloride  0.9 % 250 mL IVPB, 500 mg, Intravenous, Q24H, Cleotilde Rogue, MD, Stopped at 10/24/23 2300   cefTRIAXone  (ROCEPHIN ) 2 g in sodium chloride  0.9 % 100 mL IVPB, 2 g, Intravenous, Q24H, Adefeso, Oladapo, DO, Stopped at 10/25/23 1147   dextromethorphan -guaiFENesin  (MUCINEX  DM) 30-600 MG per 12 hr tablet 1 tablet, 1 tablet, Oral, BID, Adefeso, Oladapo, DO, 1 tablet at 10/25/23 1014   feeding supplement (ENSURE PLUS HIGH PROTEIN) liquid 237 mL, 237 mL, Oral, BID BM, Adefeso, Oladapo, DO, 237 mL at 10/25/23 1021   [START ON 10/26/2023] ferrous sulfate  tablet 325 mg, 325 mg, Oral, TID WC, Shahmehdi, Seyed A, MD   furosemide  (LASIX ) injection 20 mg, 20 mg, Intravenous, Q12H, Shahmehdi, Seyed A, MD, 20 mg at 10/25/23 1337   ipratropium (ATROVENT ) nebulizer solution 0.5 mg, 0.5 mg, Nebulization, Q4H PRN, Adefeso, Oladapo, DO, 0.5 mg at 10/25/23 0351   iron  sucrose (VENOFER ) 200 mg in sodium chloride  0.9 % 100 mL IVPB, 200 mg, Intravenous, Q24H, Shahmehdi, Seyed A, MD, Stopped at 10/25/23 1147   levalbuterol  (XOPENEX ) nebulizer solution 0.63 mg, 0.63 mg, Nebulization, Q6H, Adefeso, Oladapo, DO, 0.63 mg at 10/25/23 0815   methylPREDNISolone  sodium succinate (SOLU-MEDROL ) 125 mg/2 mL injection 80 mg, 80 mg, Intravenous, Q12H, Shahmehdi, Seyed A, MD   ondansetron  (ZOFRAN ) tablet 4 mg, 4 mg, Oral, Q6H PRN **OR** ondansetron  (ZOFRAN ) injection 4 mg, 4 mg, Intravenous, Q6H PRN, Adefeso, Oladapo, DO   pantoprazole  (PROTONIX ) injection 40 mg, 40 mg, Intravenous, Q12H, Adefeso, Oladapo,  DO, 40 mg at 10/25/23 1013   senna-docusate (Senokot-S) tablet 2 tablet, 2 tablet, Oral, QHS, Shahmehdi, Seyed A, MD  Current Outpatient Medications:    albuterol  (PROVENTIL ) (2.5 MG/3ML) 0.083% nebulizer solution, Take 3 mLs (2.5 mg total) by nebulization 3 (three) times daily as needed for shortness of breath  or wheezing., Disp: 75 mL, Rfl: 12   albuterol  (VENTOLIN  HFA) 108 (90 Base) MCG/ACT inhaler, Inhale 2 puffs into the lungs every 4 (four) hours as needed for shortness of breath., Disp: 18 g, Rfl: 3   Aspirin -Salicylamide-Caffeine  (BC HEADACHE POWDER PO), Take 1 packet by mouth as needed (headache)., Disp: , Rfl:    benzonatate (TESSALON) 100 MG capsule, Take 100 mg by mouth 3 (three) times daily as needed for cough., Disp: , Rfl:    bisoprolol  (ZEBETA ) 5 MG tablet, Take 0.5 tablets (2.5 mg total) by mouth daily., Disp: 45 tablet, Rfl: 0   docusate sodium  (COLACE) 100 MG capsule, Take 1 capsule (100 mg total) by mouth 2 (two) times daily., Disp: 60 capsule, Rfl: 2   ENSURE (ENSURE), Take 237 mLs by mouth 3 (three) times daily between meals., Disp: , Rfl:    ferrous sulfate  325 (65 FE) MG tablet, Take 1 tablet (325 mg total) by mouth daily with breakfast., Disp: 30 tablet, Rfl: 0   Fluticasone -Umeclidin-Vilant (TRELEGY ELLIPTA ) 100-62.5-25 MCG/ACT AEPB, INHALE 1 PUFF BY MOUTH EVERY DAY, Disp: , Rfl:    furosemide  (LASIX ) 20 MG tablet, Take 1 tablet (20 mg total) by mouth every Monday, Wednesday, and Friday., Disp: 12 tablet, Rfl: 1   guaiFENesin -dextromethorphan  (ROBITUSSIN DM) 100-10 MG/5ML syrup, Take 5 mLs by mouth every 4 (four) hours as needed for cough., Disp: 118 mL, Rfl: 0   Ipratropium-Albuterol  (COMBIVENT  RESPIMAT) 20-100 MCG/ACT AERS respimat, Inhale 1 puff into the lungs every 6 (six) hours as needed for wheezing or shortness of breath., Disp: 4 g, Rfl: 3   oxyCODONE -acetaminophen  (PERCOCET/ROXICET) 5-325 MG tablet, Take 1 tablet by mouth every 8 (eight) hours as needed for moderate  pain., Disp: 12 tablet, Rfl: 0   pantoprazole  (PROTONIX ) 40 MG tablet, Take 1 tablet (40 mg total) by mouth 2 (two) times daily., Disp: 60 tablet, Rfl: 11   polyethylene glycol (MIRALAX ) 17 g packet, Take 17 g by mouth daily., Disp: 14 each, Rfl: 0   predniSONE  (DELTASONE ) 20 MG tablet, Take 3 PO QAM x5days, 2 PO QAM x5days, 1 PO QAM x5days, Disp: 30 tablet, Rfl: 0   traZODone  (DESYREL ) 50 MG tablet, Take 1 tablet (50 mg total) by mouth at bedtime., Disp: 30 tablet, Rfl: 5 Allergies: Desyrel  [trazodone ]  Complete Review of Systems: GENERAL: negative for malaise, night sweats HEENT: No changes in hearing or vision, no nose bleeds or other nasal problems. NECK: Negative for lumps, goiter, pain and significant neck swelling RESPIRATORY: Negative for cough, wheezing CARDIOVASCULAR: Negative for chest pain, leg swelling, palpitations, orthopnea GI: SEE HPI MUSCULOSKELETAL: Negative for joint pain or swelling, back pain, and muscle pain. SKIN: Negative for lesions, rash PSYCH: Negative for sleep disturbance, mood disorder and recent psychosocial stressors. HEMATOLOGY Negative for prolonged bleeding, bruising easily, and swollen nodes. ENDOCRINE: Negative for cold or heat intolerance, polyuria, polydipsia and goiter. NEURO: negative for tremor, gait imbalance, syncope and seizures. The remainder of the review of systems is noncontributory.  Physical Exam: BP 114/68   Pulse (!) 107   Temp 98.4 F (36.9 C) (Oral)   Resp (!) 32   SpO2 98%  GENERAL: The patient is AO x3, tachypneic HEENT: Head is normocephalic and atraumatic. EOMI are intact. Mouth is well hydrated and without lesions. NECK: Supple. No masses LUNGS: Decreased breath sounds bilaterally.  Adequate chest expansion HEART: RRR, normal s1 and s2. ABDOMEN: Soft, nontender, no guarding, no peritoneal signs, and nondistended. BS +. No masses.   Laboratory Data  CBC:     Component Value Date/Time   WBC 7.7 10/25/2023 0551   RBC  3.25 (L) 10/25/2023 0551   HGB 7.3 (L) 10/25/2023 0908   HGB 8.6 (L) 08/26/2023 1010   HGB 9.6 (L) 08/06/2023 1103   HGB 14.3 04/09/2017 0948   HCT 25.3 (L) 10/25/2023 0908   HCT 32.8 (L) 08/06/2023 1103   HCT 42.7 04/09/2017 0948   PLT 376 10/25/2023 0551   PLT 313 08/26/2023 1010   PLT 345 08/06/2023 1103   MCV 77.8 (L) 10/25/2023 0551   MCV 85 08/06/2023 1103   MCV 86.8 04/09/2017 0948   MCH 22.8 (L) 10/25/2023 0551   MCHC 29.2 (L) 10/25/2023 0551   RDW 19.6 (H) 10/25/2023 0551   RDW 16.7 (H) 08/06/2023 1103   RDW 15.9 (H) 04/09/2017 0948   LYMPHSABS 0.6 (L) 10/24/2023 2038   LYMPHSABS 0.8 08/06/2023 1103   LYMPHSABS 1.9 04/09/2017 0948   MONOABS 0.7 10/24/2023 2038   MONOABS 0.6 04/09/2017 0948   EOSABS 0.0 10/24/2023 2038   EOSABS 0.1 08/06/2023 1103   BASOSABS 0.0 10/24/2023 2038   BASOSABS 0.0 08/06/2023 1103   BASOSABS 0.1 04/09/2017 0948   COAG:  Lab Results  Component Value Date   INR 1.1 08/09/2023   INR 1.1 05/18/2022   INR 0.94 05/05/2018    BMP:     Latest Ref Rng & Units 10/25/2023    5:51 AM 10/24/2023    8:38 PM 10/20/2023    6:04 PM  BMP  Glucose 70 - 99 mg/dL 888  892  882   BUN 8 - 23 mg/dL 20  12  11    Creatinine 0.44 - 1.00 mg/dL 9.61  9.48  9.53   Sodium 135 - 145 mmol/L 136  138  141   Potassium 3.5 - 5.1 mmol/L 4.1  4.1  3.8   Chloride 98 - 111 mmol/L 93  94  100   CO2 22 - 32 mmol/L 32  32  32   Calcium  8.9 - 10.3 mg/dL 8.4  8.8  8.4     HEPATIC:     Latest Ref Rng & Units 10/25/2023    5:51 AM 10/24/2023    8:38 PM 10/20/2023    6:04 PM  Hepatic Function  Total Protein 6.5 - 8.1 g/dL 5.9  6.5  6.0   Albumin  3.5 - 5.0 g/dL 3.0  3.3  3.3   AST 15 - 41 U/L 20  19  18    ALT 0 - 44 U/L 20  19  23    Alk Phosphatase 38 - 126 U/L 168  187  188   Total Bilirubin 0.0 - 1.2 mg/dL 0.5  0.2  0.2     CARDIAC:  Lab Results  Component Value Date   TROPONINI <0.03 05/06/2018     Imaging: I personally reviewed and interpreted the available  imaging.  Assessment & Plan:  This is a 73 y.o. female with metastatic non-small lung cancer currently on surveillance, chronic respiratory failure due to COPD on continuous home oxygen , GERD, hypertension, coronary artery disease, NSTEMI, aortic regurgitation and stenosis, peptic ulcer disease and angioectasia , Chronic iron  deficiency anemia presented with chest pain and progressive shortness of breath with cough.  GI is consulted for iron  deficiency anemia, black-colored stools with history of peptic ulcer disease  #Iron  deficiency anemia #Positive FOBT #Peptic ulcer disease/angioectasia  #Black colour stools concerning for melena  This is an elderly patient with significant comorbidities currently with acute on  chronic respiratory failure.   Ferritin 5 hemoglobin 7.4, continues to have iron  deficiency anemia  She had upper endoscopy performed last year found to have clean-based ulcer and nonbleeding angiectasia which was treated with APC.  If she has melena could be likely due to peptic ulcer disease or other subsequent angiectasia.  Patient does have history of significant NSAID use.  Patient currently appears to be significantly tachypneic with increasing oxygen  requirement ; respiratory failure and risk of procedure is much higher than benefit.   -After risk-benefit discussion with the patient about upper endoscopy +/- colonoscopy will treat conservatively with IV PPI twice daily for now .   -Trend hemoglobin and transfuse to keep more than 7   -Depending on clinical status, if patient has significant drop in hemoglobin or overt GI bleed may consider inpatient EGD+/-colonoscopy ; keep n.p.o. past midnight to reassess patient respiratory status  -Consider IV Iron  infusion while inpatient given IDA    Haydon Kalmar Faizan Xianna Siverling, MD Gastroenterology and Hepatology [facility] Gastroenterology  This chart has been completed using [facility] Dictation software, and while  attempts have been made to ensure accuracy , certain words and phrases may not be transcribed as intended

## 2023-10-26 ENCOUNTER — Inpatient Hospital Stay (HOSPITAL_COMMUNITY)

## 2023-10-26 DIAGNOSIS — R195 Other fecal abnormalities: Secondary | ICD-10-CM | POA: Diagnosis not present

## 2023-10-26 DIAGNOSIS — K5909 Other constipation: Secondary | ICD-10-CM | POA: Diagnosis not present

## 2023-10-26 DIAGNOSIS — J189 Pneumonia, unspecified organism: Secondary | ICD-10-CM | POA: Diagnosis not present

## 2023-10-26 DIAGNOSIS — D509 Iron deficiency anemia, unspecified: Secondary | ICD-10-CM | POA: Diagnosis not present

## 2023-10-26 DIAGNOSIS — Z8711 Personal history of peptic ulcer disease: Secondary | ICD-10-CM | POA: Diagnosis not present

## 2023-10-26 LAB — BLOOD GAS, ARTERIAL
Acid-Base Excess: 12.7 mmol/L — ABNORMAL HIGH (ref 0.0–2.0)
Bicarbonate: 41.1 mmol/L — ABNORMAL HIGH (ref 20.0–28.0)
Drawn by: 41977
O2 Saturation: 97.2 %
Patient temperature: 37.1
pCO2 arterial: 78 mmHg (ref 32–48)
pH, Arterial: 7.33 — ABNORMAL LOW (ref 7.35–7.45)
pO2, Arterial: 96 mmHg (ref 83–108)

## 2023-10-26 LAB — TYPE AND SCREEN
ABO/RH(D): O POS
Antibody Screen: NEGATIVE
Unit division: 0
Unit division: 0

## 2023-10-26 LAB — CBC
HCT: 31.7 % — ABNORMAL LOW (ref 36.0–46.0)
Hemoglobin: 9.3 g/dL — ABNORMAL LOW (ref 12.0–15.0)
MCH: 23.6 pg — ABNORMAL LOW (ref 26.0–34.0)
MCHC: 29.3 g/dL — ABNORMAL LOW (ref 30.0–36.0)
MCV: 80.5 fL (ref 80.0–100.0)
Platelets: 356 K/uL (ref 150–400)
RBC: 3.94 MIL/uL (ref 3.87–5.11)
RDW: 19.4 % — ABNORMAL HIGH (ref 11.5–15.5)
WBC: 9.8 K/uL (ref 4.0–10.5)
nRBC: 0.8 % — ABNORMAL HIGH (ref 0.0–0.2)

## 2023-10-26 LAB — COMPREHENSIVE METABOLIC PANEL WITH GFR
ALT: 22 U/L (ref 0–44)
AST: 19 U/L (ref 15–41)
Albumin: 3 g/dL — ABNORMAL LOW (ref 3.5–5.0)
Alkaline Phosphatase: 157 U/L — ABNORMAL HIGH (ref 38–126)
Anion gap: 8 (ref 5–15)
BUN: 15 mg/dL (ref 8–23)
CO2: 36 mmol/L — ABNORMAL HIGH (ref 22–32)
Calcium: 8.5 mg/dL — ABNORMAL LOW (ref 8.9–10.3)
Chloride: 96 mmol/L — ABNORMAL LOW (ref 98–111)
Creatinine, Ser: 0.43 mg/dL — ABNORMAL LOW (ref 0.44–1.00)
GFR, Estimated: >60 mL/min (ref >60)
Glucose, Bld: 140 mg/dL — ABNORMAL HIGH (ref 70–99)
Potassium: 4.5 mmol/L (ref 3.5–5.1)
Sodium: 140 mmol/L (ref 135–145)
Total Bilirubin: 0.8 mg/dL (ref 0.0–1.2)
Total Protein: 6 g/dL — ABNORMAL LOW (ref 6.5–8.1)

## 2023-10-26 LAB — RESPIRATORY PANEL BY PCR

## 2023-10-26 LAB — BPAM RBC
Blood Product Expiration Date: 202508062359
Blood Product Expiration Date: 202508072359
ISSUE DATE / TIME: 202507131447
ISSUE DATE / TIME: 202507131743
Unit Type and Rh: 5100
Unit Type and Rh: 5100

## 2023-10-26 MED ORDER — AZITHROMYCIN 250 MG PO TABS
500.0000 mg | ORAL_TABLET | Freq: Every day | ORAL | Status: DC
  Administered 2023-10-26 – 2023-10-27 (×2): 500 mg via ORAL
  Filled 2023-10-26 (×2): qty 2

## 2023-10-26 MED ORDER — MELATONIN 3 MG PO TABS
6.0000 mg | ORAL_TABLET | Freq: Every evening | ORAL | Status: DC | PRN
  Filled 2023-10-26: qty 2

## 2023-10-26 MED ORDER — HYDROMORPHONE HCL 1 MG/ML IJ SOLN
1.0000 mg | INTRAMUSCULAR | Status: DC | PRN
  Administered 2023-10-26 – 2023-10-27 (×2): 1 mg via INTRAVENOUS
  Filled 2023-10-26 (×2): qty 1

## 2023-10-26 MED ORDER — FUROSEMIDE 10 MG/ML IJ SOLN
40.0000 mg | Freq: Two times a day (BID) | INTRAMUSCULAR | Status: DC
  Administered 2023-10-26 – 2023-10-28 (×5): 40 mg via INTRAVENOUS
  Filled 2023-10-26 (×5): qty 4

## 2023-10-26 MED ORDER — METHYLPREDNISOLONE SODIUM SUCC 40 MG IJ SOLR
40.0000 mg | Freq: Two times a day (BID) | INTRAMUSCULAR | Status: DC
  Administered 2023-10-26 – 2023-10-27 (×2): 40 mg via INTRAVENOUS
  Filled 2023-10-26 (×2): qty 1

## 2023-10-26 MED ORDER — OXYCODONE HCL 5 MG PO TABS
5.0000 mg | ORAL_TABLET | Freq: Four times a day (QID) | ORAL | Status: DC | PRN
  Administered 2023-10-26 – 2023-10-29 (×5): 5 mg via ORAL
  Filled 2023-10-26 (×5): qty 1

## 2023-10-26 MED ORDER — BUDESONIDE 0.25 MG/2ML IN SUSP
0.2500 mg | Freq: Two times a day (BID) | RESPIRATORY_TRACT | Status: DC
  Administered 2023-10-26 – 2023-10-30 (×9): 0.25 mg via RESPIRATORY_TRACT
  Filled 2023-10-26 (×9): qty 2

## 2023-10-26 MED ORDER — DOCUSATE SODIUM 100 MG PO CAPS
100.0000 mg | ORAL_CAPSULE | Freq: Two times a day (BID) | ORAL | Status: DC
  Administered 2023-10-26 – 2023-10-27 (×2): 100 mg via ORAL
  Filled 2023-10-26 (×2): qty 1

## 2023-10-26 MED ORDER — MEGESTROL ACETATE 40 MG/ML PO SUSP
400.0000 mg | Freq: Two times a day (BID) | ORAL | Status: DC
  Administered 2023-10-26 – 2023-10-30 (×8): 400 mg via ORAL
  Filled 2023-10-26 (×18): qty 10

## 2023-10-26 NOTE — Plan of Care (Signed)

## 2023-10-26 NOTE — Progress Notes (Signed)
 PROGRESS NOTE    Patient: Courtney Zuniga                            PCP: Myra Geni ORN, FNP                    DOB: [birth year]            DOA: 10/24/2023 FMW:[id number]             DOS: 10/26/2023, 11:17 AM   LOS: 2 days   Date of Service: The patient was seen and examined on 10/26/2023  Subjective:   The patient was seen and examined this morning, stable no acute distress, on 4 L of oxygen , satting 100%, pCO2 elevated at 78 S/p 2U PRBC blood transfusion, hemoglobin 7.3>> 9.3 this morning  Reporting no further tarry black stool or rectal bleed Denies any chest pain, shortness of breath at baseline  Brief Narrative:   Donella Pascarella is a 73 y.o. female with medical history significant of COPD, asthma, chronic respiratory failure with hypoxia on supplemental oxygen  via [place] at 4 LPM, coronary artery disease, depression and lung cancer who presents to the emergency department due to several days onset of left-sided chest pain with progressive shortness of breath and several weeks of nonproductive cough.  She was noted to have black stool and her home health nurse states that this may be due to blood in her stool.  Patient endorsed taking Pepto-Bismol recently.   ED Course: - Tachypneic with respiratory rate of 35/min, tachycardic with HR of 103 bpm, O2 sat 97% on 4 LPM,  WBC 7.8, hemoglobin 8.0, hematocrit 26.9, MCV 78.0, platelets 408.  BMP was normal except for chloride of 94 and blood glucose of 107, albumin  3.3, ALP 187.   troponin x 2 - 12 > 11. FOBT positive,  Chest x-ray showed new airspace opacities in the left lower lobe, suspicious for pneumonia. Stable focal opacity in the right mid lung and linear atelectasis or scarring in the right lung base. Patient was treated with IV ceftriaxone  and azithromycin , breathing treatment with DuoNeb was provided.       Assessment & Plan:   Principal Problem:   CAP (community acquired pneumonia) Active Problems:   COPD exacerbation (HCC)    Essential hypertension   Iron  deficiency anemia   Adenocarcinoma of right lung, stage 4   Acute on chronic respiratory failure with hypoxia (HCC)   Rectal bleeding   Microcytic anemia   Hypoalbuminemia due to protein-calorie malnutrition (HCC)      Acute on chronic respiratory failure due to COPD exacerbation/pneumonia -Continue supplemental oxygen , maintaining O2 sat 88-92% -On 4 L of oxygen , satting 100% -ABG reviewed, pCO2 78--- reducing oxygen  -Continue nebs, IV steroids, mucolytics, IV antibiotics--- titrating down steroids and antibiotics  Community-acquired pneumonia ammonia -Chest x-ray was suspicious for pneumonia -Was started on ceftriaxone  azithromycin , will continue -Will follow-up with blood cultures, sputum culture, urine Legionella, strep pneumo  -  Procalcitonin <0.10 Continue Mucinex , incentive spirometry, flutter valve    Acute exacerbation of COPD Acute on chronic respiratory failure with hypoxia  Continue Xopenex , Atrovent , Mucinex , Solu-Medrol , azithromycin . Continue Protonix  to prevent steroid-induced ulcer Continue incentive spirometry and flutter valve Continue supplemental oxygen  to maintain O2 sat > 94%    Rectal bleeding-no history of anemia of chronic disease-deficiency anemia Hemoccult was positive    Latest Ref Rng & Units 10/25/2023    9:08 AM 10/25/2023  5:51 AM 10/24/2023    8:38 PM  CBC  WBC 4.0 - 10.5 K/uL  7.7  7.8   Hemoglobin 12.0 - 15.0 g/dL 7.3  7.4  8.0   Hematocrit 36.0 - 46.0 % 25.3  25.3  26.9   Platelets 150 - 400 K/uL  376  408    Patient endorsed taking ferrous sulfate  for iron  deficiency anemia Continue IV Protonix  40 mg twice daily Gastroenterologist consulted-recommending outpatient follow-up and workup   Since patient is tachypneic, tachycardic, hypotensive-  10/25/2023 s/p 2U PRBC blood transfusion    Microcytic anemia-with Sever iron  deficiency anemia Iron /TIBC/Ferritin/ %Sat    Component Value Date/Time    IRON  7 (L) 10/25/2023 0551   TIBC 358 10/25/2023 0551   FERRITIN 5 (L) 10/25/2023 0551   IRONPCTSAT 2 (L) 10/25/2023 0551  - Initiating IV iron  - Supplement p.o. iron  on discharge    Hypoalbuminemia possibly secondary to mild protein calorie malnutrition Albumin  3.3, protein supplement will be provided   Essential hypertension Patient was not taking bisoprolol  Continue blood pressure monitoring   Adenocarcinoma of right lung, stage 4 Patient follows up as outpatient with oncology (Dr. Gatha)    ----------------------------------------------------------------------------------------------------------------- Nutritional status:  The patient's BMI is: Body mass index is 20.35 kg/m. I agree with the assessment and plan as outlined   Cultures; Blood Cultures x 2 >> Sputum Culture >>    ------------------------------------------------------------------------------------------------------------------------------------------------  DVT prophylaxis:  SCDs Start: 10/25/23 0124   Code Status:   Code Status: Full Code  Family Communication: No family member present at bedside- -Advance care planning has been discussed.   Admission status:   Status is: Inpatient Remains inpatient appropriate because: Needing breathing treatments, respiratory support   Disposition: From  - home             Planning for discharge in 1-2 days: to   Procedures:   No admission procedures for hospital encounter.   Antimicrobials:  Anti-infectives (From admission, onward)    Start     Dose/Rate Route Frequency Ordered Stop   10/25/23 1000  cefTRIAXone  (ROCEPHIN ) 2 g in sodium chloride  0.9 % 100 mL IVPB        2 g 200 mL/hr over 30 Minutes Intravenous Every 24 hours 10/25/23 0126 10/29/23 0959   10/24/23 2145  cefTRIAXone  (ROCEPHIN ) 1 g in sodium chloride  0.9 % 100 mL IVPB        1 g 200 mL/hr over 30 Minutes Intravenous  Once 10/24/23 2136 10/24/23 2300   10/24/23 2145  azithromycin   (ZITHROMAX ) 500 mg in sodium chloride  0.9 % 250 mL IVPB        500 mg 250 mL/hr over 60 Minutes Intravenous Every 24 hours 10/24/23 2136          Medication:   budesonide  (PULMICORT ) nebulizer solution  0.25 mg Nebulization BID   Chlorhexidine  Gluconate Cloth  6 each Topical Q0600   dextromethorphan -guaiFENesin   1 tablet Oral BID   feeding supplement  237 mL Oral BID BM   furosemide   40 mg Intravenous Q12H   levalbuterol   0.63 mg Nebulization Q6H   methylPREDNISolone  (SOLU-MEDROL ) injection  40 mg Intravenous Q12H   pantoprazole  (PROTONIX ) IV  40 mg Intravenous Q12H   polyethylene glycol  17 g Oral BID   senna-docusate  2 tablet Oral QHS    acetaminophen  **OR** acetaminophen , ipratropium, ondansetron  **OR** ondansetron  (ZOFRAN ) IV   Objective:   Vitals:   10/26/23 0758 10/26/23 0800 10/26/23 0900 10/26/23 1000  BP:  104/65 103/66 105/68  Pulse: 94 94    Resp: (!) 25 (!) 22 (!) 29 (!) 37  Temp:      TempSrc:      SpO2: 100% 100%  100%  Weight:      Height:        Intake/Output Summary (Last 24 hours) at 10/26/2023 1117 Last data filed at 10/26/2023 0313 Gross per 24 hour  Intake 1016.67 ml  Output --  Net 1016.67 ml   Filed Weights   10/25/23 1727  Weight: 57.2 kg     Physical examination:    General:  AAO x 3,  cooperative, no distress;   HEENT:  Normocephalic, PERRL, otherwise with in Normal limits   Neuro:  CNII-XII intact. , normal motor and sensation, reflexes intact   Lungs:   Clear to auscultation BL, Respirations unlabored,  Diffuse wheezes / crackles  Cardio:    S1/S2, RRR, No murmure, No Rubs or Gallops   Abdomen:  Soft, non-tender, bowel sounds active all four quadrants, no guarding or peritoneal signs.  Muscular  skeletal:  Limited exam -global generalized weaknesses - in bed, able to move all 4 extremities,   2+ pulses,  symmetric, No pitting edema  Skin:  Dry, warm to touch, negative for any Rashes,  Wounds: Please see nursing  documentation    ------------------------------------------------------------------------------------------------------------------    LABs:     Latest Ref Rng & Units 10/26/2023    3:35 AM 10/25/2023    9:08 AM 10/25/2023    5:51 AM  CBC  WBC 4.0 - 10.5 K/uL 9.8   7.7   Hemoglobin 12.0 - 15.0 g/dL 9.3  7.3  7.4   Hematocrit 36.0 - 46.0 % 31.7  25.3  25.3   Platelets 150 - 400 K/uL 356   376       Latest Ref Rng & Units 10/26/2023    3:35 AM 10/25/2023    5:51 AM 10/24/2023    8:38 PM  CMP  Glucose 70 - 99 mg/dL 859  888  892   BUN 8 - 23 mg/dL 15  20  12    Creatinine 0.44 - 1.00 mg/dL 9.56  9.61  9.48   Sodium 135 - 145 mmol/L 140  136  138   Potassium 3.5 - 5.1 mmol/L 4.5  4.1  4.1   Chloride 98 - 111 mmol/L 96  93  94   CO2 22 - 32 mmol/L 36  32  32   Calcium  8.9 - 10.3 mg/dL 8.5  8.4  8.8   Total Protein 6.5 - 8.1 g/dL 6.0  5.9  6.5   Total Bilirubin 0.0 - 1.2 mg/dL 0.8  0.5  0.2   Alkaline Phos 38 - 126 U/L 157  168  187   AST 15 - 41 U/L 19  20  19    ALT 0 - 44 U/L 22  20  19         Micro Results Recent Results (from the past 240 hours)  Culture, blood (routine x 2) Call MD if unable to obtain prior to antibiotics being given     Status: None (Preliminary result)   Collection Time: 10/25/23  2:00 AM   Specimen: BLOOD RIGHT HAND  Result Value Ref Range Status   Specimen Description BLOOD RIGHT HAND  Final   Special Requests   Final    BOTTLES DRAWN AEROBIC AND ANAEROBIC Blood Culture adequate volume   Culture   Final    NO GROWTH 1 DAY Performed at [facility]  [facility], [address] [place], [place] [address]    Report Status PENDING  Incomplete  Culture, blood (routine x 2) Call MD if unable to obtain prior to antibiotics being given     Status: None (Preliminary result)   Collection Time: 10/25/23  2:06 AM   Specimen: BLOOD LEFT HAND  Result Value Ref Range Status   Specimen Description BLOOD LEFT HAND  Final   Special Requests   Final    BOTTLES DRAWN AEROBIC  AND ANAEROBIC Blood Culture adequate volume   Culture   Final    NO GROWTH 1 DAY Performed at [facility], [address], [place], [place] [address]    Report Status PENDING  Incomplete  MRSA Next Gen by PCR, Nasal     Status: None   Collection Time: 10/25/23  4:11 PM   Specimen: Nasal Mucosa; Nasal Swab  Result Value Ref Range Status   MRSA by PCR Next Gen NOT DETECTED NOT DETECTED Final    Comment: (NOTE) The GeneXpert MRSA Assay (FDA approved for NASAL specimens only), is one component of a comprehensive MRSA colonization surveillance program. It is not intended to diagnose MRSA infection nor to guide or monitor treatment for MRSA infections. Test performance is not FDA approved in patients less than 4 years old. Performed at [facility], [address] [place], [place] [address]   Respiratory (~20 pathogens) panel by PCR     Status: None   Collection Time: 10/26/23  1:41 AM   Specimen: Nasopharyngeal Swab; Respiratory  Result Value Ref Range Status   Adenovirus NOT DETECTED NOT DETECTED Final   Coronavirus 229E NOT DETECTED NOT DETECTED Final    Comment: (NOTE) The Coronavirus on the Respiratory Panel, DOES NOT test for the novel  Coronavirus (2019 nCoV)    Coronavirus HKU1 NOT DETECTED NOT DETECTED Final   Coronavirus NL63 NOT DETECTED NOT DETECTED Final   Coronavirus OC43 NOT DETECTED NOT DETECTED Final   Metapneumovirus NOT DETECTED NOT DETECTED Final   Rhinovirus / Enterovirus NOT DETECTED NOT DETECTED Final   Influenza A NOT DETECTED NOT DETECTED Final   Influenza B NOT DETECTED NOT DETECTED Final   Parainfluenza Virus 1 NOT DETECTED NOT DETECTED Final   Parainfluenza Virus 2 NOT DETECTED NOT DETECTED Final   Parainfluenza Virus 3 NOT DETECTED NOT DETECTED Final   Parainfluenza Virus 4 NOT DETECTED NOT DETECTED Final   Respiratory Syncytial Virus NOT DETECTED NOT DETECTED Final   Bordetella pertussis NOT DETECTED NOT DETECTED Final   Bordetella Parapertussis NOT  DETECTED NOT DETECTED Final   Chlamydophila pneumoniae NOT DETECTED NOT DETECTED Final   Mycoplasma pneumoniae NOT DETECTED NOT DETECTED Final    Comment: Performed at [facility], [address] [place], [place] [address]    Radiology Reports DG CHEST PORT 1 VIEW Result Date: 10/26/2023 CLINICAL DATA:  Shortness of breath. EXAM: PORTABLE CHEST 1 VIEW COMPARISON:  Same day. FINDINGS: Stable cardiomediastinal silhouette. Right internal jugular Port-A-Cath is unchanged. Stable interstitial densities are noted. Stable left basilar atelectasis or infiltrate is noted. Minimal right basilar subsegmental atelectasis or scarring is noted with possible small pleural effusion. Bony thorax is unremarkable. IMPRESSION: Stable bilateral chronic interstitial densities are noted. Stable left basilar atelectasis or infiltrate is noted. Minimal right basilar subsegmental atelectasis or scarring is noted with possible small right pleural effusion. Electronically Signed   By: Lynwood Landy Raddle M.D.   On: 10/26/2023 10:25   DG Chest 1 View Result Date: 10/26/2023 CLINICAL DATA:  Shortness of breath EXAM: PORTABLE CHEST 1 VIEW COMPARISON:  10/24/2023 FINDINGS: Cardiac shadow is stable. Aortic calcifications are noted. Right chest wall port is again seen. Chronic interstitial changes are again noted and stable. Persistent airspace opacity in the left base is noted. The spiculated changes in the right upper lobe are less prominent than that seen on the prior exam. No bony abnormality is noted. IMPRESSION: Stable appearance of the chest when compared with the prior exam. Left basilar infiltrative density is seen. Electronically Signed   By: Oneil Devonshire M.D.   On: 10/26/2023 03:40    SIGNED: Adriana DELENA Grams, MD, FHM. FAAFP. Jolynn Pack - [facility] Time spent - 55 min.  In seeing, evaluating and examining the patient. Reviewing medical records, labs, drawn plan of care. [facility],  Pager (please use  [email or web address] to page/ text) Please use Epic Secure Chat for non-urgent communication (7AM-7PM)  If 7PM-7AM, please contact night-coverage [email or web address], 10/26/2023, 11:17 AM

## 2023-10-26 NOTE — Progress Notes (Signed)
 Patient with increased WOB upon entering room. RT placed patient on Bipap. Patient is tolerating well at this time. RN aware.

## 2023-10-26 NOTE — Progress Notes (Signed)
 Subjective: Reports she isn't doing too well from respiratory standpoint.  Denies BRBPR or melena since presenting to the hospital, but states she has not had a bowel movement.  She does tend to have chronic issues with constipation.  States MiraLAX  as needed has helped her at home.  While at home, she was noticing black stools though she does take iron  at home.  Denies nausea, vomiting, abdominal pain.  States she was taking pantoprazole  twice a day as prescribed at home, but continued taking BC powders daily for headaches and also taking 1 ibuprofen  every other night to help her sleep.  Objective: Vital signs in last 24 hours: Temp:  [97.4 F (36.3 C)-98.9 F (37.2 C)] 97.4 F (36.3 C) (07/14 0400) Pulse Rate:  [91-138] 94 (07/14 0758) Resp:  [16-48] 25 (07/14 0758) BP: (96-136)/(56-74) 115/68 (07/14 0700) SpO2:  [61 %-100 %] 100 % (07/14 0758) FiO2 (%):  [45 %] 45 % (07/14 0758) Weight:  [57.2 kg] 57.2 kg (07/13 1727) Last BM Date : 10/24/23 (per pt) General:   Alert and oriented, pleasant, labored breathing, but  just got back in bed from using the restroom. Head:  Normocephalic and atraumatic. Eyes:  No icterus, sclera clear. Conjuctiva pink.  Abdomen:  Bowel sounds present, soft, non-tender, non-distended. No HSM or hernias noted. No rebound or guarding. No masses appreciated  Msk:  Symmetrical without gross deformities. Normal posture. Extremities:  Without edema. Neurologic:  Alert and  oriented x4;  grossly normal neurologically. Psych:  Normal mood and affect.  Intake/Output from previous day: 07/13 0701 - 07/14 0700 In: 1016.7 [P.O.:120; I.V.:30.7; Blood:616; IV Piggyback:250] Out: -  Intake/Output this shift: No intake/output data recorded.  Lab Results: Recent Labs    10/24/23 2038 10/25/23 0551 10/25/23 0908 10/26/23 0335  WBC 7.8 7.7  --  9.8  HGB 8.0* 7.4* 7.3* 9.3*  HCT 26.9* 25.3* 25.3* 31.7*  PLT 408* 376  --  356   BMET Recent Labs     10/24/23 2038 10/25/23 0551 10/26/23 0335  NA 138 136 140  K 4.1 4.1 4.5  CL 94* 93* 96*  CO2 32 32 36*  GLUCOSE 107* 111* 140*  BUN 12 20 15   CREATININE 0.51 0.38* 0.43*  CALCIUM  8.8* 8.4* 8.5*   LFT Recent Labs    10/24/23 2038 10/25/23 0551 10/26/23 0335  PROT 6.5 5.9* 6.0*  ALBUMIN  3.3* 3.0* 3.0*  AST 19 20 19   ALT 19 20 22   ALKPHOS 187* 168* 157*  BILITOT 0.2 0.5 0.8    Studies/Results: DG Chest 1 View Result Date: 10/26/2023 CLINICAL DATA:  Shortness of breath EXAM: PORTABLE CHEST 1 VIEW COMPARISON:  10/24/2023 FINDINGS: Cardiac shadow is stable. Aortic calcifications are noted. Right chest wall port is again seen. Chronic interstitial changes are again noted and stable. Persistent airspace opacity in the left base is noted. The spiculated changes in the right upper lobe are less prominent than that seen on the prior exam. No bony abnormality is noted. IMPRESSION: Stable appearance of the chest when compared with the prior exam. Left basilar infiltrative density is seen. Electronically Signed   By: Oneil Devonshire M.D.   On: 10/26/2023 03:40   DG Chest Port 1 View Result Date: 10/24/2023 CLINICAL DATA:  Chest pain and shortness of breath EXAM: PORTABLE CHEST 1 VIEW COMPARISON:  Chest x-ray 09/17/2023.  CT chest 08/04/2023 FINDINGS: Right chest port catheter tip ends in the distal SVC. The lungs are hyperinflated compatible with emphysema, unchanged. Increased  patchy airspace opacities identified in the left lower lobe. Focal opacity in the right mid lung and linear atelectasis or scarring in the right lung base persists. Spiculated ill-defined nodular density in the right upper lobe appears unchanged. Right chest port catheter tip ends in the distal SVC. The heart is mildly enlarged. There is no pleural effusion or pneumothorax. IMPRESSION: 1. New airspace opacities in the left lower lobe, suspicious for pneumonia. 2. Stable focal opacity in the right mid lung and linear  atelectasis or scarring in the right lung base. 3. Stable spiculated ill-defined nodular density in the right upper lobe. 4. Emphysema. Electronically Signed   By: Greig Pique M.D.   On: 10/24/2023 21:04    Assessment: 73 y.o. female with metastatic non-small lung cancer currently on surveillance, chronic respiratory failure due to COPD on continuous home oxygen , GERD, hypertension, coronary artery disease, NSTEMI, aortic regurgitation and stenosis, peptic ulcer disease and angioectasia , chronic iron  deficiency anemia, who presented with chest pain and progressive shortness of breath with cough, admitted with COPD exacerbation, pneumonia, and GI consulted for iron  deficiency anemia, black stool, history of PAD.  IDA, black stool/heme positive stool: Recent baseline hemoglobin in the 7-8 range with hemoglobin 7.4 on admission with ferritin 5.  Reports black stool at home but she does take iron  daily.  Denies BRBPR.  Known history of gastric ulcer as well as multiple small bowel AVMs treated with APC on last EGD in February 2024.  Surveillance EGD was never performed and unfortunately, patient has continued to take [facility] powders daily along with ibuprofen  every other day.  Last colonoscopy in 2010 with poor prep.  Considering ongoing NSAID use, black/heme positive stool, more likely this is related to PUD/AVMs, but unable to rule out colonic source.  Ideally, she needs EGD and colonoscopy, but considering acute on chronic respiratory failure this admission and discussion about risk versus benefits at the time of initial consult with Dr. Cinderella yesterday, the decision was made to treat conservatively with IV PPI twice daily.  If patient were to have significant drop in hemoglobin or overt GI bleeding, could consider inpatient EGD +/- colonoscopy.   Encouraging, patient has not had any overt GI bleeding.  Her hemoglobin did improve appropriately to 9.3 this morning after receiving 2 units PRBCs yesterday.  She  is also receiving IV iron .   Chronic constipation: No bowel movement since admission.  Abdomen is soft, nontender, nondistended, and with positive bowel sounds today.  MiraLAX  and Senokot were started yesterday. Will continue this.   Plan: Continue to monitor H&H and for overt GI bleeding.  Transfuse as needed. Continue IV PPI twice daily. Agree with IV iron  administration this admission.  Continue oral iron  daily at discharge.  Would hold while inpatient as this will cloud picture in evaluating for overt IG bleeding.  Continue MiraLAX  17 g twice daily and Senokot at bedtime Consider EGD +/-colonoscopy if hemoglobin doesn't  remains stable or she has overt GI bleeding. Discussed the importance of strict NSAID avoidance.   LOS: 2 days    10/26/2023, 9:33 AM   Josette Centers, PA-C [facility]

## 2023-10-26 NOTE — TOC Initial Note (Addendum)
 Transition of Care [facility]) - Initial/Assessment Note    Patient Details  Name: Courtney Zuniga MRN: [id number] Date of Birth: June 25, 1950  Transition of Care [facility]) CM/SW Contact:    Mcarthur Saddie Kim, LCSW Phone Number: 10/26/2023, 11:33 AM  Clinical Narrative:  Pt admitted for community acquired pneumonia. Assessment completed due to high risk readmission score. Pt reports her son lives with her. She is independent with ADLs. Pt indicates she is active with home health agency, but can't recall name of agency. Per Hedda Kos, and [place] they are not active with pt. Will work to confirm agency to make sure resumption orders are in prior to d/c. TOC will follow.               Update: 7/15- Pt does not appear to be open with home health currently. TOC will follow and refer if needed.   Expected Discharge Plan: Home w Home Health Services Barriers to Discharge: Continued Medical Work up   Patient Goals and CMS Choice Patient states their goals for this hospitalization and ongoing recovery are:: return home   Choice offered to / list presented to : Patient [place] ownership interest in [facility].provided to::  (n/a)    Expected Discharge Plan and Services In-house Referral: Clinical Social Work   Post Acute Care Choice: Resumption of Svcs/PTA Provider Living arrangements for the past 2 months: Single Family Home                                      Prior Living Arrangements/Services Living arrangements for the past 2 months: Single Family Home Lives with:: Adult Children Patient language and need for interpreter reviewed:: Yes Do you feel safe going back to the place where you live?: Yes      Need for Family Participation in Patient Care: No (Comment)   Current home services: DME (walker, wheelchair) Criminal Activity/Legal Involvement Pertinent to Current Situation/Hospitalization: No - Comment as needed  Activities of Daily Living   ADL  Screening (condition at time of admission) Independently performs ADLs?: Yes (appropriate for developmental age) Is the patient deaf or have difficulty hearing?: No Does the patient have difficulty seeing, even when wearing glasses/contacts?: No Does the patient have difficulty concentrating, remembering, or making decisions?: No  Permission Sought/Granted                  Emotional Assessment     Affect (typically observed): Appropriate Orientation: : Oriented to Self, Oriented to Place, Oriented to  Time, Oriented to Situation Alcohol / Substance Use: Not Applicable Psych Involvement: No (comment)  Admission diagnosis:  Rectal bleeding [K62.5] COPD exacerbation (HCC) [J44.1] CAP (community acquired pneumonia) [J18.9] Acute on chronic respiratory failure (HCC) [J96.20] Anemia, unspecified type [D64.9] Patient Active Problem List   Diagnosis Date Noted   Rectal bleeding 10/25/2023   Microcytic anemia 10/25/2023   Hypoalbuminemia due to protein-calorie malnutrition (HCC) 10/25/2023   Acute on chronic respiratory failure (HCC) 10/25/2023   CAP (community acquired pneumonia) 10/24/2023   CO2 narcosis 09/19/2023   COPD (chronic obstructive pulmonary disease) (HCC) 09/18/2023   Chronic anemia 08/11/2023   Acute metabolic encephalopathy 08/10/2023   COPD GOLD 2 criteria 2017 08/07/2023   Chronic respiratory failure with hypoxia and hypercapnia (HCC) 08/07/2023   Lobar pneumonia (HCC) 06/03/2023   Acute on chronic hypoxic respiratory failure (HCC) 05/28/2023   Sinus tachycardia 10/13/2022   (HFpEF) heart failure  with preserved ejection fraction (HCC) 10/13/2022   Aortic regurgitation 10/13/2022   Aortic stenosis 10/13/2022   Nicotine  abuse 10/13/2022   DOE (dyspnea on exertion) 10/06/2022   Acute on chronic respiratory failure with hypoxia (HCC) 08/25/2022   Pleural effusion 08/25/2022   Hypotension 06/12/2022   Iron  deficiency anemia 06/12/2022   Acute on chronic anemia  05/20/2022   History of Gastric ulcer 05/20/2022   AVM (arteriovenous malformation) of small bowel, 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   Upper GI bleed 05/18/2022   Acute blood loss anemia 05/18/2022   Acute respiratory failure with hypoxia (HCC) 04/23/2022   Frequent PVCs 04/09/2022   Port-A-Cath in place 03/03/2022   Increased frequency of headaches 10/18/2020   COPD exacerbation (HCC) 11/05/2019   Chronic nonintractable headache    Acute on chronic respiratory failure with hypoxia and hypercapnia (HCC) 06/16/2018   Influenza A 06/16/2018   Severe protein-calorie malnutrition (HCC) 06/16/2018   GERD (gastroesophageal reflux disease) 06/16/2018   Bilateral pneumonia 05/05/2018   Sepsis due to CAP 05/05/2018   Chronic respiratory failure with hypoxia (HCC) 05/05/2018   Encounter for antineoplastic immunotherapy 04/23/2017   Depression 05/15/2016   Adenocarcinoma of right lung, stage 4 01/10/2016   Encounter for antineoplastic chemotherapy 01/10/2016   Lung mass 01/03/2016   Arthritis    Subepithelial gastric mass    Mucosal abnormality of stomach    Abdominal pain 08/08/2015   Abnormal CT of the abdomen 08/08/2015   Hypoxia 06/26/2014   COPD with acute exacerbation (HCC) 06/26/2014   Community acquired bilateral lower lobe pneumonia    Chest pain of uncertain etiology    Asthma, chronic    Chest pain 02/16/2014   Essential hypertension 02/16/2014   DDD (degenerative disc disease), lumbar 02/04/2011   Lumbar herniated disc 01/09/2011   PCP:  Myra Geni ORN, FNP Pharmacy:   [facility] - [place], [place] - [address] [place] [address] Phone: [phone] Fax: [phone]  [facility] - [place], [place] - [address] [place] [address] Phone: [phone] Fax: [phone]     Social Drivers of Health (SDOH) Social History: SDOH Screenings   Food Insecurity: No Food Insecurity (10/25/2023)   Housing: Unknown (10/25/2023)  Transportation Needs: No Transportation Needs (10/25/2023)  Utilities: Not At Risk (10/25/2023)  Financial Resource Strain: Low Risk  (05/05/2018)  Physical Activity: Insufficiently Active (05/05/2018)  Social Connections: Moderately Isolated (10/25/2023)  Stress: No Stress Concern Present (05/05/2018)  Tobacco Use: Medium Risk (10/24/2023)   SDOH Interventions:     Readmission Risk Interventions    09/22/2023   10:59 AM 09/21/2023    1:05 PM 09/18/2023    1:24 PM  Readmission Risk Prevention Plan  Transportation Screening Complete Complete Complete  HRI or Home Care Consult Complete Complete Complete  Social Work Consult for Recovery Care Planning/Counseling Complete Complete Complete  Palliative Care Screening Patient Refused Not Applicable Complete  Medication Review Oceanographer) Complete Complete Complete

## 2023-10-26 NOTE — Progress Notes (Signed)
   10/26/23 0850  Provider Notification  Provider Name/Title Dr. Willette  Date Provider Notified 10/26/23  Time Provider Notified [phone]  Method of Notification Virtual Face-to-face  Notification Reason Critical Result  Test performed and critical result [id number] [id number]  Date Critical Result Received 10/26/23  Time Critical Result Received 0845  Provider response No new orders  Date of Provider Response 10/26/23

## 2023-10-27 ENCOUNTER — Telehealth: Payer: Self-pay | Admitting: Gastroenterology

## 2023-10-27 ENCOUNTER — Telehealth (INDEPENDENT_AMBULATORY_CARE_PROVIDER_SITE_OTHER): Payer: Self-pay | Admitting: Gastroenterology

## 2023-10-27 DIAGNOSIS — J189 Pneumonia, unspecified organism: Secondary | ICD-10-CM | POA: Diagnosis not present

## 2023-10-27 DIAGNOSIS — D62 Acute posthemorrhagic anemia: Secondary | ICD-10-CM

## 2023-10-27 DIAGNOSIS — J9621 Acute and chronic respiratory failure with hypoxia: Secondary | ICD-10-CM | POA: Diagnosis not present

## 2023-10-27 LAB — COMPREHENSIVE METABOLIC PANEL WITH GFR
ALT: 21 U/L (ref 0–44)
AST: 17 U/L (ref 15–41)
Albumin: 3.2 g/dL — ABNORMAL LOW (ref 3.5–5.0)
Alkaline Phosphatase: 159 U/L — ABNORMAL HIGH (ref 38–126)
Anion gap: 9 (ref 5–15)
BUN: 23 mg/dL (ref 8–23)
CO2: 39 mmol/L — ABNORMAL HIGH (ref 22–32)
Calcium: 9 mg/dL (ref 8.9–10.3)
Chloride: 93 mmol/L — ABNORMAL LOW (ref 98–111)
Creatinine, Ser: 0.51 mg/dL (ref 0.44–1.00)
GFR, Estimated: >60 mL/min (ref >60)
Glucose, Bld: 139 mg/dL — ABNORMAL HIGH (ref 70–99)
Potassium: 5.4 mmol/L — ABNORMAL HIGH (ref 3.5–5.1)
Sodium: 141 mmol/L (ref 135–145)
Total Bilirubin: 0.3 mg/dL (ref 0.0–1.2)
Total Protein: 6.7 g/dL (ref 6.5–8.1)

## 2023-10-27 LAB — CBC
HCT: 34.1 % — ABNORMAL LOW (ref 36.0–46.0)
Hemoglobin: 9.9 g/dL — ABNORMAL LOW (ref 12.0–15.0)
MCH: 24.2 pg — ABNORMAL LOW (ref 26.0–34.0)
MCHC: 29 g/dL — ABNORMAL LOW (ref 30.0–36.0)
MCV: 83.4 fL (ref 80.0–100.0)
Platelets: 412 K/uL — ABNORMAL HIGH (ref 150–400)
RBC: 4.09 MIL/uL (ref 3.87–5.11)
RDW: 21.2 % — ABNORMAL HIGH (ref 11.5–15.5)
WBC: 10.9 K/uL — ABNORMAL HIGH (ref 4.0–10.5)
nRBC: 1.2 % — ABNORMAL HIGH (ref 0.0–0.2)

## 2023-10-27 MED ORDER — LINACLOTIDE 145 MCG PO CAPS
145.0000 ug | ORAL_CAPSULE | Freq: Every day | ORAL | Status: DC
Start: 2023-10-28 — End: 2023-10-28
  Administered 2023-10-28: 145 ug via ORAL
  Filled 2023-10-27: qty 1

## 2023-10-27 MED ORDER — LEVOFLOXACIN 750 MG PO TABS
750.0000 mg | ORAL_TABLET | Freq: Every day | ORAL | Status: AC
  Administered 2023-10-28: 750 mg via ORAL
  Filled 2023-10-27: qty 1

## 2023-10-27 MED ORDER — PREDNISONE 20 MG PO TABS
50.0000 mg | ORAL_TABLET | Freq: Every day | ORAL | Status: DC
  Administered 2023-10-28: 50 mg via ORAL
  Filled 2023-10-27: qty 1

## 2023-10-27 MED ORDER — POLYETHYLENE GLYCOL 3350 17 G PO PACK
17.0000 g | PACK | Freq: Once | ORAL | Status: AC
  Administered 2023-10-27: 17 g via ORAL
  Filled 2023-10-27: qty 1

## 2023-10-27 MED ORDER — HYDROMORPHONE HCL 1 MG/ML IJ SOLN: 0.5000 mg | INTRAMUSCULAR | Status: DC | PRN

## 2023-10-27 MED ORDER — SODIUM CHLORIDE 0.9 % IV SOLN
2.0000 g | INTRAVENOUS | Status: AC
  Administered 2023-10-27: 2 g via INTRAVENOUS
  Filled 2023-10-27: qty 20

## 2023-10-27 MED ORDER — LEVOFLOXACIN 750 MG PO TABS: 750.0000 mg | ORAL_TABLET | Freq: Every day | ORAL | Status: DC

## 2023-10-27 MED ORDER — FERROUS SULFATE 325 (65 FE) MG PO TABS
325.0000 mg | ORAL_TABLET | Freq: Two times a day (BID) | ORAL | Status: DC
Start: 2023-10-27 — End: 2023-10-30
  Administered 2023-10-27 – 2023-10-30 (×6): 325 mg via ORAL
  Filled 2023-10-27 (×6): qty 1

## 2023-10-27 MED ORDER — PANTOPRAZOLE SODIUM 40 MG PO TBEC
40.0000 mg | DELAYED_RELEASE_TABLET | Freq: Two times a day (BID) | ORAL | Status: DC
  Administered 2023-10-27 – 2023-10-30 (×7): 40 mg via ORAL
  Filled 2023-10-27 (×7): qty 1

## 2023-10-27 NOTE — Progress Notes (Signed)
 PROGRESS NOTE    Patient: Courtney Zuniga                            PCP: Myra Geni ORN, FNP                    DOB: 12-31-1950            DOA: 10/24/2023 FMW:[id number]             DOS: 10/27/2023, 10:09 AM   LOS: 3 days   Date of Service: The patient was seen and examined on 10/27/2023  Subjective:   The patient was seen and examined this morning-lethargic as she got Dilaudid - As per nursing report was briefly on BiPAP overnight otherwise stable, satting 96% on 4 L of oxygen  Tachycardic with heart rate 103, tachypneic with RR of 29, blood pressure elevated 154/79   S/p 2U PRBC blood transfusion, hemoglobin 7.3>> 9.3 >9.9 this morning   Brief Narrative:   Courtney Zuniga is a 73 y.o. female with medical history significant of COPD, asthma, chronic respiratory failure with hypoxia on supplemental oxygen  via [place] at 4 LPM, coronary artery disease, depression and lung cancer who presents to the emergency department due to several days onset of left-sided chest pain with progressive shortness of breath and several weeks of nonproductive cough.  She was noted to have black stool and her home health nurse states that this may be due to blood in her stool.  Patient endorsed taking Pepto-Bismol recently.   ED Course: - Tachypneic with respiratory rate of 35/min, tachycardic with HR of 103 bpm, O2 sat 97% on 4 LPM,  WBC 7.8, hemoglobin 8.0, hematocrit 26.9, MCV 78.0, platelets 408.  BMP was normal except for chloride of 94 and blood glucose of 107, albumin  3.3, ALP 187.   troponin x 2 - 12 > 11. FOBT positive,  Chest x-ray showed new airspace opacities in the left lower lobe, suspicious for pneumonia. Stable focal opacity in the right mid lung and linear atelectasis or scarring in the right lung base. Patient was treated with IV ceftriaxone  and azithromycin , breathing treatment with DuoNeb was provided.       Assessment & Plan:   Principal Problem:   CAP (community acquired pneumonia) Active  Problems:   COPD exacerbation (HCC)   Essential hypertension   Iron  deficiency anemia   Adenocarcinoma of right lung, stage 4   Acute on chronic respiratory failure with hypoxia (HCC)   Rectal bleeding   Microcytic anemia   Hypoalbuminemia due to protein-calorie malnutrition (HCC)      Acute on chronic respiratory failure due to COPD exacerbation/pneumonia - Per nursing staff can tolerate only BiPAP only briefly -Back on 4 L of oxygen , satting 96% -Continue supplemental oxygen , maintaining O2 sat 88-92%  -last ABG reviewed, pCO2 78--- reducing oxygen  -Continue nebs, IV steroids-switching to p.o. with quick taper -Continue mucolytics,  - IV antibiotics Rocephin /azithromycin  switched to p.o. Levaquin   Community-acquired pneumonia ammonia -Chest x-ray was suspicious for pneumonia -Was on ceftriaxone  azithromycin , --switching to p.o. Levaquin  - Cultures-no growth to date -  Procalcitonin <0.10 Continue Mucinex , incentive spirometry, flutter valve    Acute exacerbation of COPD Acute on chronic respiratory failure with hypoxia  Continue Xopenex , Atrovent , Mucinex , Solu-Medrol , azithromycin . Continue Protonix  to prevent steroid-induced ulcer Continue incentive spirometry and flutter valve Continue supplemental oxygen  to maintain O2 sat > 94%    Rectal bleeding-no history of anemia of chronic disease-deficiency anemia  Hemoccult was positive    Latest Ref Rng & Units 10/27/2023    4:24 AM 10/26/2023    3:35 AM 10/25/2023    9:08 AM  CBC  WBC 4.0 - 10.5 K/uL 10.9  9.8    Hemoglobin 12.0 - 15.0 g/dL 9.9  9.3  7.3   Hematocrit 36.0 - 46.0 % 34.1  31.7  25.3   Platelets 150 - 400 K/uL 412  356     Patient endorsed taking ferrous sulfate  for iron  deficiency anemia Was on IV Protonix  40 mg twice daily>> switching to p.o. Gastroenterologist consulted-recommending outpatient follow-up and workup   Since patient is tachypneic, tachycardic, hypotensive-  10/25/2023 s/p 2U PRBC blood  transfusion    Microcytic anemia-with Sever iron  deficiency anemia Iron /TIBC/Ferritin/ %Sat    Component Value Date/Time   IRON  7 (L) 10/25/2023 0551   TIBC 358 10/25/2023 0551   FERRITIN 5 (L) 10/25/2023 0551   IRONPCTSAT 2 (L) 10/25/2023 0551  - Initiating IV iron  - Supplement p.o. iron  on discharge    Hypoalbuminemia possibly secondary to mild protein calorie malnutrition Albumin  3.3, protein supplement will be provided   Essential hypertension Patient was not taking bisoprolol  Continue blood pressure monitoring   Adenocarcinoma of right lung, stage 4 Patient follows up as outpatient with oncology (Dr. Gatha)   Transferring out of ICU 10/27/2023 ----------------------------------------------------------------------------------------------------------------- Nutritional status:  The patient's BMI is: Body mass index is 20.35 kg/m. I agree with the assessment and plan as outlined   Cultures; Blood Cultures x 2 >> no growth to date -------------------------------------------------------------------------------------------------------------------------------------------  DVT prophylaxis:  SCDs Start: 10/25/23 0124   Code Status:   Code Status: Full Code  Family Communication: No family member present at bedside- -Advance care planning has been discussed.   Admission status:   Status is: Inpatient Remains inpatient appropriate because: Needing breathing treatments, respiratory support   Disposition: From  - home             Planning for discharge in 1 days: to home w [facility]   Procedures:   No admission procedures for hospital encounter.   Antimicrobials:  Anti-infectives (From admission, onward)    Start     Dose/Rate Route Frequency Ordered Stop   10/28/23 1200  levofloxacin  (LEVAQUIN ) tablet 750 mg  Status:  Discontinued        750 mg Oral Daily 10/27/23 0958 10/27/23 1001   10/28/23 1000  levofloxacin  (LEVAQUIN ) tablet 750 mg        750 mg Oral Daily  10/27/23 1001 10/29/23 0959   10/27/23 1100  cefTRIAXone  (ROCEPHIN ) 2 g in sodium chloride  0.9 % 100 mL IVPB        2 g 200 mL/hr over 30 Minutes Intravenous Every 24 hours 10/27/23 1000 10/28/23 1059   10/26/23 1215  azithromycin  (ZITHROMAX ) tablet 500 mg  Status:  Discontinued        500 mg Oral Daily 10/26/23 1119 10/27/23 1000   10/25/23 1000  cefTRIAXone  (ROCEPHIN ) 2 g in sodium chloride  0.9 % 100 mL IVPB  Status:  Discontinued        2 g 200 mL/hr over 30 Minutes Intravenous Every 24 hours 10/25/23 0126 10/27/23 0958   10/24/23 2145  cefTRIAXone  (ROCEPHIN ) 1 g in sodium chloride  0.9 % 100 mL IVPB        1 g 200 mL/hr over 30 Minutes Intravenous  Once 10/24/23 2136 10/24/23 2300   10/24/23 2145  azithromycin  (ZITHROMAX ) 500 mg in sodium chloride  0.9 % 250 mL IVPB  Status:  Discontinued        500 mg 250 mL/hr over 60 Minutes Intravenous Every 24 hours 10/24/23 2136 10/26/23 1119        Medication:   budesonide  (PULMICORT ) nebulizer solution  0.25 mg Nebulization BID   Chlorhexidine  Gluconate Cloth  6 each Topical Q0600   dextromethorphan -guaiFENesin   1 tablet Oral BID   docusate sodium   100 mg Oral BID   feeding supplement  237 mL Oral BID BM   ferrous sulfate   325 mg Oral BID WC   furosemide   40 mg Intravenous Q12H   levalbuterol   0.63 mg Nebulization Q6H   [START ON 10/28/2023] levofloxacin   750 mg Oral Daily   megestrol   400 mg Oral BID   pantoprazole  (PROTONIX ) IV  40 mg Intravenous Q12H   polyethylene glycol  17 g Oral BID   [START ON 10/28/2023] predniSONE   50 mg Oral Q breakfast   senna-docusate  2 tablet Oral QHS    acetaminophen  **OR** acetaminophen , HYDROmorphone  (DILAUDID ) injection, ipratropium, melatonin, ondansetron  **OR** ondansetron  (ZOFRAN ) IV, oxyCODONE    Objective:   Vitals:   10/27/23 0700 10/27/23 0724 10/27/23 0800 10/27/23 0900  BP: 122/77  (!) 145/75 (!) 154/79  Pulse: 95  (!) 103   Resp: (!) 34  (!) 26 (!) 29  Temp:  98.1 F (36.7 C)     TempSrc:  Oral    SpO2: 100%  96%   Weight:      Height:        Intake/Output Summary (Last 24 hours) at 10/27/2023 1009 Last data filed at 10/27/2023 0820 Gross per 24 hour  Intake 451.45 ml  Output --  Net 451.45 ml   Filed Weights   10/25/23 1727  Weight: 57.2 kg     Physical examination:        General:  AAO x 3,  cooperative, no distress;   HEENT:  Normocephalic, PERRL, otherwise with in Normal limits   Neuro:  CNII-XII intact. , normal motor and sensation, reflexes intact   Lungs:   Clear to auscultation BL, Respirations unlabored,  No wheezes / crackles  Cardio:    S1/S2, RRR, No murmure, No Rubs or Gallops   Abdomen:  Soft, non-tender, bowel sounds active all four quadrants, no guarding or peritoneal signs.  Muscular  skeletal:  Limited exam -global generalized weaknesses - in bed, able to move all 4 extremities,   2+ pulses,  symmetric, No pitting edema  Skin:  Dry, warm to touch, negative for any Rashes,  Wounds: Please see nursing documentation         ------------------------------------------------------------------------------------------------------------------    LABs:     Latest Ref Rng & Units 10/27/2023    4:24 AM 10/26/2023    3:35 AM 10/25/2023    9:08 AM  CBC  WBC 4.0 - 10.5 K/uL 10.9  9.8    Hemoglobin 12.0 - 15.0 g/dL 9.9  9.3  7.3   Hematocrit 36.0 - 46.0 % 34.1  31.7  25.3   Platelets 150 - 400 K/uL 412  356        Latest Ref Rng & Units 10/27/2023    4:24 AM 10/26/2023    3:35 AM 10/25/2023    5:51 AM  CMP  Glucose 70 - 99 mg/dL 860  859  888   BUN 8 - 23 mg/dL 23  15  20    Creatinine 0.44 - 1.00 mg/dL 9.48  9.56  9.61   Sodium 135 - 145 mmol/L 141  140  136   Potassium 3.5 - 5.1 mmol/L 5.4  4.5  4.1   Chloride 98 - 111 mmol/L 93  96  93   CO2 22 - 32 mmol/L 39  36  32   Calcium  8.9 - 10.3 mg/dL 9.0  8.5  8.4   Total Protein 6.5 - 8.1 g/dL 6.7  6.0  5.9   Total Bilirubin 0.0 - 1.2 mg/dL 0.3  0.8  0.5   Alkaline Phos 38 -  126 U/L 159  157  168   AST 15 - 41 U/L 17  19  20    ALT 0 - 44 U/L 21  22  20         Micro Results Recent Results (from the past 240 hours)  Culture, blood (routine x 2) Call MD if unable to obtain prior to antibiotics being given     Status: None (Preliminary result)   Collection Time: 10/25/23  2:00 AM   Specimen: BLOOD RIGHT HAND  Result Value Ref Range Status   Specimen Description BLOOD RIGHT HAND  Final   Special Requests   Final    BOTTLES DRAWN AEROBIC AND ANAEROBIC Blood Culture adequate volume   Culture   Final    NO GROWTH 2 DAYS Performed at [facility], [address] [place], [place] [address]    Report Status PENDING  Incomplete  Culture, blood (routine x 2) Call MD if unable to obtain prior to antibiotics being given     Status: None (Preliminary result)   Collection Time: 10/25/23  2:06 AM   Specimen: BLOOD LEFT HAND  Result Value Ref Range Status   Specimen Description BLOOD LEFT HAND  Final   Special Requests   Final    BOTTLES DRAWN AEROBIC AND ANAEROBIC Blood Culture adequate volume   Culture   Final    NO GROWTH 2 DAYS Performed at [facility], [address] [place], [place] [address]    Report Status PENDING  Incomplete  MRSA Next Gen by PCR, Nasal     Status: None   Collection Time: 10/25/23  4:11 PM   Specimen: Nasal Mucosa; Nasal Swab  Result Value Ref Range Status   MRSA by PCR Next Gen NOT DETECTED NOT DETECTED Final    Comment: (NOTE) The GeneXpert MRSA Assay (FDA approved for NASAL specimens only), is one component of a comprehensive MRSA colonization surveillance program. It is not intended to diagnose MRSA infection nor to guide or monitor treatment for MRSA infections. Test performance is not FDA approved in patients less than 38 years old. Performed at [facility], [address], [place], [place] [address]   Respiratory (~20 pathogens) panel by PCR     Status: None   Collection Time: 10/26/23  1:41 AM   Specimen: Nasopharyngeal  Swab; Respiratory  Result Value Ref Range Status   Adenovirus NOT DETECTED NOT DETECTED Final   Coronavirus 229E NOT DETECTED NOT DETECTED Final    Comment: (NOTE) The Coronavirus on the Respiratory Panel, DOES NOT test for the novel  Coronavirus (2019 nCoV)    Coronavirus HKU1 NOT DETECTED NOT DETECTED Final   Coronavirus NL63 NOT DETECTED NOT DETECTED Final   Coronavirus OC43 NOT DETECTED NOT DETECTED Final   Metapneumovirus NOT DETECTED NOT DETECTED Final   Rhinovirus / Enterovirus NOT DETECTED NOT DETECTED Final   Influenza A NOT DETECTED NOT DETECTED Final   Influenza B NOT DETECTED NOT DETECTED Final   Parainfluenza Virus 1 NOT DETECTED NOT DETECTED Final  Parainfluenza Virus 2 NOT DETECTED NOT DETECTED Final   Parainfluenza Virus 3 NOT DETECTED NOT DETECTED Final   Parainfluenza Virus 4 NOT DETECTED NOT DETECTED Final   Respiratory Syncytial Virus NOT DETECTED NOT DETECTED Final   Bordetella pertussis NOT DETECTED NOT DETECTED Final   Bordetella Parapertussis NOT DETECTED NOT DETECTED Final   Chlamydophila pneumoniae NOT DETECTED NOT DETECTED Final   Mycoplasma pneumoniae NOT DETECTED NOT DETECTED Final    Comment: Performed at [facility], [address] [place], [place] [address]    Radiology Reports No results found.   SIGNED: Adriana DELENA Grams, MD, FHM. FAAFP. Jolynn Pack - [facility] Time spent - 55 min.  In seeing, evaluating and examining the patient. Reviewing medical records, labs, drawn plan of care. [facility],  Pager (please use [email or web address] to page/ text) Please use Epic Secure Chat for non-urgent communication (7AM-7PM)  If 7PM-7AM, please contact night-coverage [email or web address], 10/27/2023, 10:09 AM

## 2023-10-27 NOTE — Telephone Encounter (Signed)
 Error

## 2023-10-27 NOTE — Plan of Care (Signed)
   Problem: Education: Goal: Knowledge of General Education information will improve Description: Including pain rating scale, medication(s)/side effects and non-pharmacologic comfort measures Outcome: Progressing   Problem: Activity: Goal: Risk for activity intolerance will decrease Outcome: Progressing   Problem: Coping: Goal: Level of anxiety will decrease Outcome: Progressing   Problem: Pain Managment: Goal: General experience of comfort will improve and/or be controlled Outcome: Progressing   Problem: Safety: Goal: Ability to remain free from injury will improve Outcome: Progressing   Problem: Skin Integrity: Goal: Risk for impaired skin integrity will decrease Outcome: Progressing

## 2023-10-27 NOTE — Plan of Care (Signed)
  Problem: Clinical Measurements: Goal: Respiratory complications will improve Outcome: Progressing   Problem: Clinical Measurements: Goal: Cardiovascular complication will be avoided Outcome: Progressing   Problem: Pain Managment: Goal: General experience of comfort will improve and/or be controlled Outcome: Progressing   Problem: Safety: Goal: Ability to remain free from injury will improve Outcome: Progressing   Problem: Education: Goal: Knowledge of General Education information will improve Description: Including pain rating scale, medication(s)/side effects and non-pharmacologic comfort measures Outcome: Progressing   Problem: Clinical Measurements: Goal: Will remain free from infection Outcome: Progressing   Problem: Clinical Measurements: Goal: Diagnostic test results will improve Outcome: Progressing

## 2023-10-27 NOTE — Progress Notes (Addendum)
 Subjective: Unable to wake patient up today. Spoke with nurse. States she gave dilaudid  for headache and patient has been somnolent since. States Dr. Willette has assessed the situation. Nurse states patient had not had a BM, so no overt bleeding. No complaints of abdominal pain, nausea, vomiting. Reports respiratory status at baseline on 4L [place]. Chronically short of breath at rest despite normal O2 saturation.   Objective: Vital signs in last 24 hours: Temp:  [97.5 F (36.4 C)-99 F (37.2 C)] 98.2 F (36.8 C) (07/15 1124) Pulse Rate:  [95-108] 104 (07/15 1124) Resp:  [17-38] 32 (07/15 1124) BP: (107-158)/(54-83) 155/83 (07/15 1100) SpO2:  [96 %-100 %] 99 % (07/15 1124) FiO2 (%):  [45 %] 45 % (07/15 0347) Last BM Date : 10/26/23 (per pt) General:   Somnolent. Opens eyes briefly to loud verbal stimuli, but doesn't stay awake and doesn't answer questions.  Head:  Normocephalic and atraumatic. Abdomen:  Bowel sounds present, soft, non-tender, non-distended.  Msk:  Symmetrical without gross deformities. Normal posture. Extremities:  Without edema. Neurologic:  Unable to assess.   Intake/Output from previous day: 07/14 0701 - 07/15 0700 In: 331.5 [P.O.:120; IV Piggyback:211.5] Out: -  Intake/Output this shift: Total I/O In: 120 [P.O.:120] Out: -   Lab Results: Recent Labs    10/25/23 0551 10/25/23 0908 10/26/23 0335 10/27/23 0424  WBC 7.7  --  9.8 10.9*  HGB 7.4* 7.3* 9.3* 9.9*  HCT 25.3* 25.3* 31.7* 34.1*  PLT 376  --  356 412*   BMET Recent Labs    10/25/23 0551 10/26/23 0335 10/27/23 0424  NA 136 140 141  K 4.1 4.5 5.4*  CL 93* 96* 93*  CO2 32 36* 39*  GLUCOSE 111* 140* 139*  BUN 20 15 23   CREATININE 0.38* 0.43* 0.51  CALCIUM  8.4* 8.5* 9.0   LFT Recent Labs    10/25/23 0551 10/26/23 0335 10/27/23 0424  PROT 5.9* 6.0* 6.7  ALBUMIN  3.0* 3.0* 3.2*  AST 20 19 17   ALT 20 22 21   ALKPHOS 168* 157* 159*  BILITOT 0.5 0.8 0.3    Studies/Results: DG  CHEST PORT 1 VIEW Result Date: 10/26/2023 CLINICAL DATA:  Shortness of breath. EXAM: PORTABLE CHEST 1 VIEW COMPARISON:  Same day. FINDINGS: Stable cardiomediastinal silhouette. Right internal jugular Port-A-Cath is unchanged. Stable interstitial densities are noted. Stable left basilar atelectasis or infiltrate is noted. Minimal right basilar subsegmental atelectasis or scarring is noted with possible small pleural effusion. Bony thorax is unremarkable. IMPRESSION: Stable bilateral chronic interstitial densities are noted. Stable left basilar atelectasis or infiltrate is noted. Minimal right basilar subsegmental atelectasis or scarring is noted with possible small right pleural effusion. Electronically Signed   By: Lynwood Landy Raddle M.D.   On: 10/26/2023 10:25   DG Chest 1 View Result Date: 10/26/2023 CLINICAL DATA:  Shortness of breath EXAM: PORTABLE CHEST 1 VIEW COMPARISON:  10/24/2023 FINDINGS: Cardiac shadow is stable. Aortic calcifications are noted. Right chest wall port is again seen. Chronic interstitial changes are again noted and stable. Persistent airspace opacity in the left base is noted. The spiculated changes in the right upper lobe are less prominent than that seen on the prior exam. No bony abnormality is noted. IMPRESSION: Stable appearance of the chest when compared with the prior exam. Left basilar infiltrative density is seen. Electronically Signed   By: Oneil Devonshire M.D.   On: 10/26/2023 03:40    Assessment: 73 y.o. female with metastatic non-small lung cancer currently on surveillance, chronic  respiratory failure due to COPD on continuous home oxygen , GERD, hypertension, coronary artery disease, NSTEMI, aortic regurgitation and stenosis, peptic ulcer disease and angioectasia , chronic iron  deficiency anemia, who presented with chest pain and progressive shortness of breath with cough, admitted with COPD exacerbation, pneumonia, and GI consulted for iron  deficiency anemia, black stool,  history of PUD.   IDA, black stool/heme positive stool: Recent baseline hemoglobin in the 7-8 range with hemoglobin 7.4 on admission with ferritin 5.  Reports black stool at home but she does take iron  daily.  Denies BRBPR.  Known history of gastric ulcer as well as multiple small bowel AVMs treated with APC on last EGD in February 2024.  Surveillance EGD was never performed and unfortunately, patient has continued to take [facility] powders daily along with ibuprofen  every other day.  Last colonoscopy in 2010 with poor prep.   Considering ongoing NSAID use, black/heme positive stool, more likely this is related to PUD/AVMs, but unable to rule out colonic source.  Ideally, she needs EGD and colonoscopy, but considering acute on chronic respiratory failure this admission, discussion about risk vs benefits was had at the time of initial consult with Dr. Cinderella and decided to treat conservatively with IV PPI twice daily.  If patient were to have significant drop in hemoglobin or overt GI bleeding, could consider inpatient EGD +/- colonoscopy.   Encouraging, patient has not had any overt GI bleeding. Her hemoglobin improved to 9.3 yesterday after after receiving 2 units PRBCs and has improved to 9.9 today. She has also received IV iron  this admission.   Chronic constipation:  No bowel movement since admission. Abdomen is soft, nontender, nondistended, and with positive bowel sounds today. MiraLAX  BID and Senokot-S started on 7/13, but refused morning MiraLAX  yesterday. Colace 100 mg BID started last night.  Will start Linzess  145 mcg due to use of multiple agents with no results. Pain medications are likely playing a role. Should limit these as possible.    Plan: Continue to monitor H&H and for overt GI bleeding.  Transfuse as needed. Continue IV PPI twice daily. Continue oral iron  daily at discharge.  Would hold while inpatient as this will cloud picture in evaluating for overt GI bleeding.  No inpatient  endoscopic evaluation unless patient has overt GI bleeding/significant drop in hemoglobin. Can reassess outpatient, but she will remain high risk for procedures considering chronic respiratory failure.  Needs strict avoidance of all NSAIDs.  Will stop colace and MiraLAX .  Start Linzess  145 mcg daily.  Limit pain medications as much as possible.     LOS: 3 days    10/27/2023, 11:31 AM   Josette Centers, PA-C [facility]

## 2023-10-27 NOTE — Telephone Encounter (Signed)
 Hi Amanda,  Can you please schedule a follow up appointment for this patient in 3-4 weeks with Dr. Cindie, [place] or [place]?  Thanks,  Toribio Fortune, MD Gastroenterology and Hepatology [facility]

## 2023-10-28 ENCOUNTER — Encounter (HOSPITAL_COMMUNITY): Payer: Self-pay | Admitting: Family Medicine

## 2023-10-28 ENCOUNTER — Inpatient Hospital Stay (HOSPITAL_COMMUNITY)

## 2023-10-28 DIAGNOSIS — J9622 Acute and chronic respiratory failure with hypercapnia: Secondary | ICD-10-CM

## 2023-10-28 DIAGNOSIS — Z7189 Other specified counseling: Secondary | ICD-10-CM | POA: Diagnosis not present

## 2023-10-28 DIAGNOSIS — Z515 Encounter for palliative care: Secondary | ICD-10-CM

## 2023-10-28 DIAGNOSIS — J9621 Acute and chronic respiratory failure with hypoxia: Secondary | ICD-10-CM | POA: Diagnosis not present

## 2023-10-28 DIAGNOSIS — J189 Pneumonia, unspecified organism: Secondary | ICD-10-CM | POA: Diagnosis not present

## 2023-10-28 LAB — BLOOD GAS, ARTERIAL
Acid-Base Excess: 26.5 mmol/L — ABNORMAL HIGH (ref 0.0–2.0)
Bicarbonate: 55.1 mmol/L — ABNORMAL HIGH (ref 20.0–28.0)
Drawn by: 31977
O2 Saturation: 98.7 %
Patient temperature: 37.1
pCO2 arterial: 83 mmHg (ref 32–48)
pH, Arterial: 7.43 (ref 7.35–7.45)
pO2, Arterial: 109 mmHg — ABNORMAL HIGH (ref 83–108)

## 2023-10-28 LAB — COMPREHENSIVE METABOLIC PANEL WITH GFR
ALT: 17 U/L (ref 0–44)
AST: 13 U/L — ABNORMAL LOW (ref 15–41)
Albumin: 3.2 g/dL — ABNORMAL LOW (ref 3.5–5.0)
Alkaline Phosphatase: 148 U/L — ABNORMAL HIGH (ref 38–126)
Anion gap: 12 (ref 5–15)
BUN: 18 mg/dL (ref 8–23)
CO2: 44 mmol/L — ABNORMAL HIGH (ref 22–32)
Calcium: 9 mg/dL (ref 8.9–10.3)
Chloride: 86 mmol/L — ABNORMAL LOW (ref 98–111)
Creatinine, Ser: 0.33 mg/dL — ABNORMAL LOW (ref 0.44–1.00)
GFR, Estimated: >60 mL/min (ref >60)
Glucose, Bld: 123 mg/dL — ABNORMAL HIGH (ref 70–99)
Potassium: 4.2 mmol/L (ref 3.5–5.1)
Sodium: 142 mmol/L (ref 135–145)
Total Bilirubin: 0.4 mg/dL (ref 0.0–1.2)
Total Protein: 6.2 g/dL — ABNORMAL LOW (ref 6.5–8.1)

## 2023-10-28 LAB — CBC
HCT: 33.2 % — ABNORMAL LOW (ref 36.0–46.0)
Hemoglobin: 9.7 g/dL — ABNORMAL LOW (ref 12.0–15.0)
MCH: 24.2 pg — ABNORMAL LOW (ref 26.0–34.0)
MCHC: 29.2 g/dL — ABNORMAL LOW (ref 30.0–36.0)
MCV: 82.8 fL (ref 80.0–100.0)
Platelets: 440 K/uL — ABNORMAL HIGH (ref 150–400)
RBC: 4.01 MIL/uL (ref 3.87–5.11)
RDW: 21.7 % — ABNORMAL HIGH (ref 11.5–15.5)
WBC: 9.9 K/uL (ref 4.0–10.5)
nRBC: 1.5 % — ABNORMAL HIGH (ref 0.0–0.2)

## 2023-10-28 LAB — BRAIN NATRIURETIC PEPTIDE: B Natriuretic Peptide: 375 pg/mL — ABNORMAL HIGH (ref 0.0–100.0)

## 2023-10-28 LAB — LACTIC ACID, PLASMA: Lactic Acid, Venous: 0.8 mmol/L (ref 0.5–1.9)

## 2023-10-28 LAB — PROCALCITONIN: Procalcitonin: <0.1 ng/mL

## 2023-10-28 LAB — HEPARIN LEVEL (UNFRACTIONATED): Heparin Unfractionated: <0.1 [IU]/mL — ABNORMAL LOW (ref 0.30–0.70)

## 2023-10-28 MED ORDER — REVEFENACIN 175 MCG/3ML IN SOLN
175.0000 ug | Freq: Every day | RESPIRATORY_TRACT | Status: DC
  Administered 2023-10-29 – 2023-10-30 (×2): 175 ug via RESPIRATORY_TRACT
  Filled 2023-10-28 (×2): qty 3

## 2023-10-28 MED ORDER — LORAZEPAM 2 MG/ML IJ SOLN
INTRAMUSCULAR | Status: AC
  Administered 2023-10-28: 0.5 mg
  Filled 2023-10-28: qty 1

## 2023-10-28 MED ORDER — PIPERACILLIN-TAZOBACTAM 3.375 G IVPB
3.3750 g | Freq: Three times a day (TID) | INTRAVENOUS | Status: DC
  Administered 2023-10-28 – 2023-10-30 (×6): 3.375 g via INTRAVENOUS
  Filled 2023-10-28 (×6): qty 50

## 2023-10-28 MED ORDER — IOHEXOL 350 MG/ML SOLN
75.0000 mL | Freq: Once | INTRAVENOUS | Status: AC | PRN
Start: 2023-10-28 — End: 2023-10-28
  Administered 2023-10-28: 75 mL via INTRAVENOUS

## 2023-10-28 MED ORDER — ARFORMOTEROL TARTRATE 15 MCG/2ML IN NEBU
15.0000 ug | INHALATION_SOLUTION | Freq: Two times a day (BID) | RESPIRATORY_TRACT | Status: DC
  Administered 2023-10-28 – 2023-10-30 (×4): 15 ug via RESPIRATORY_TRACT
  Filled 2023-10-28 (×4): qty 2

## 2023-10-28 MED ORDER — METHYLPREDNISOLONE SODIUM SUCC 125 MG IJ SOLR
80.0000 mg | Freq: Two times a day (BID) | INTRAMUSCULAR | Status: DC
  Administered 2023-10-28 – 2023-10-30 (×4): 80 mg via INTRAVENOUS
  Filled 2023-10-28 (×4): qty 2

## 2023-10-28 MED ORDER — METHYLPREDNISOLONE SODIUM SUCC 40 MG IJ SOLR
40.0000 mg | Freq: Two times a day (BID) | INTRAMUSCULAR | Status: DC
  Administered 2023-10-28: 40 mg via INTRAVENOUS
  Filled 2023-10-28: qty 1

## 2023-10-28 MED ORDER — ALPRAZOLAM 0.5 MG PO TABS
0.5000 mg | ORAL_TABLET | Freq: Three times a day (TID) | ORAL | Status: DC | PRN
  Administered 2023-10-28 – 2023-10-29 (×2): 0.5 mg via ORAL
  Filled 2023-10-28 (×2): qty 1

## 2023-10-28 MED ORDER — MIDAZOLAM HCL 2 MG/2ML IJ SOLN: 0.5000 mg | Freq: Four times a day (QID) | INTRAMUSCULAR | Status: DC | PRN

## 2023-10-28 MED ORDER — LEVALBUTEROL HCL 0.63 MG/3ML IN NEBU
0.6300 mg | INHALATION_SOLUTION | RESPIRATORY_TRACT | Status: DC | PRN
  Administered 2023-10-29: 0.63 mg via RESPIRATORY_TRACT
  Filled 2023-10-28: qty 3

## 2023-10-28 MED ORDER — HEPARIN BOLUS VIA INFUSION
4000.0000 [IU] | Freq: Once | INTRAVENOUS | Status: AC
  Administered 2023-10-28: 4000 [IU] via INTRAVENOUS
  Filled 2023-10-28: qty 4000

## 2023-10-28 MED ORDER — LINACLOTIDE 145 MCG PO CAPS
145.0000 ug | ORAL_CAPSULE | Freq: Every day | ORAL | Status: DC
  Administered 2023-10-30: 145 ug via ORAL
  Filled 2023-10-28: qty 1

## 2023-10-28 MED ORDER — HEPARIN (PORCINE) 25000 UT/250ML-% IV SOLN
850.0000 [IU]/h | INTRAVENOUS | Status: DC
  Administered 2023-10-28: 800 [IU]/h via INTRAVENOUS
  Filled 2023-10-28: qty 250

## 2023-10-28 NOTE — Progress Notes (Deleted)
   10/28/23 1309  Provider Notification  Provider Name/Title Dr. Willette  Date Provider Notified 10/28/23  Time Provider Notified 1310  Method of Notification Virtual Face-to-face  Notification Reason Critical Result  Test performed and critical result Pco2 83  Date Critical Result Received 10/28/23  Time Critical Result Received 1305  Provider response No new orders

## 2023-10-28 NOTE — Progress Notes (Signed)
 PHARMACY - ANTICOAGULATION CONSULT NOTE  Pharmacy Consult for Heparin  Indication: pulmonary embolus  Allergies  Allergen Reactions   Desyrel  [Trazodone ] Other (See Comments)    Made me faint    Patient Measurements: Height: 5' 6 (167.6 cm) Weight: 57.2 kg (126 lb 1.7 oz) IBW/kg (Calculated) : 59.3 HEPARIN  DW (KG): 57.2  Vital Signs: Temp: 99.3 F (37.4 C) (07/16 1121) Temp Source: Oral (07/16 1121) BP: 122/78 (07/16 1130) Pulse Rate: 114 (07/16 1158)  Labs: Recent Labs    10/26/23 0335 10/27/23 0424 10/28/23 0414  HGB 9.3* 9.9* 9.7*  HCT 31.7* 34.1* 33.2*  PLT 356 412* 440*  CREATININE 0.43* 0.51 0.33*    Estimated Creatinine Clearance: 55.7 mL/min (A) (by C-G formula based on SCr of 0.33 mg/dL (L)).   Medical History: Past Medical History:  Diagnosis Date   Anemia    as a young woman   Arthritis    osteoartritis   Asthma    Brain tumor (benign) (HCC) 2005 [facility]   Benign   Chronic headaches    Chronic hip pain    Chronic pain    COPD (chronic obstructive pulmonary disease) (HCC)    Coronary artery disease    Depression    Depression 05/15/2016   Encounter for antineoplastic chemotherapy 01/10/2016   GERD (gastroesophageal reflux disease)    Hypertension    Lung cancer (HCC) dx'd 01/2016   currently on chemo and radiation    NSTEMI (non-ST elevated myocardial infarction) (HCC) yrs ago   On home O2    qhs 2 liters at hs and prn   Pneumonia last time 2 yrs ago   Shortness of breath dyspnea    with activity    Medications:  Medications Prior to Admission  Medication Sig Dispense Refill Last Dose/Taking   Aspirin -Salicylamide-Caffeine  (BC HEADACHE POWDER PO) Take 1 packet by mouth as needed (headache).   Unknown   benzonatate (TESSALON) 100 MG capsule Take 100 mg by mouth 3 (three) times daily as needed for cough.   Past Week   bisoprolol  (ZEBETA ) 5 MG tablet Take 0.5 tablets (2.5 mg total) by mouth daily. 45 tablet 0 Past Week   docusate sodium   (COLACE) 100 MG capsule Take 1 capsule (100 mg total) by mouth 2 (two) times daily. 60 capsule 2 Past Week   ENSURE (ENSURE) Take 237 mLs by mouth 3 (three) times daily between meals.   Past Week   Fluticasone -Umeclidin-Vilant (TRELEGY ELLIPTA ) 100-62.5-25 MCG/ACT AEPB INHALE 1 PUFF BY MOUTH EVERY DAY   Past Week   furosemide  (LASIX ) 20 MG tablet Take 1 tablet (20 mg total) by mouth every Monday, Wednesday, and Friday. 12 tablet 1 Past Week   Ipratropium-Albuterol  (COMBIVENT  RESPIMAT) 20-100 MCG/ACT AERS respimat Inhale 1 puff into the lungs every 6 (six) hours as needed for wheezing or shortness of breath. 4 g 3 Past Week   megestrol  (MEGACE ) 40 MG/ML suspension Take 400 mg by mouth 3 (three) times daily.   Past Week   Polyethylene Glycol 3350  (PEG 3350 ) 17 GM/SCOOP POWD Take 17 g by mouth daily.   Unknown   traZODone  (DESYREL ) 50 MG tablet Take 1 tablet (50 mg total) by mouth at bedtime. 30 tablet 5 Past Week   ferrous sulfate  325 (65 FE) MG tablet Take 1 tablet (325 mg total) by mouth daily with breakfast. 30 tablet 0     Assessment: Pt is 73 YOF history of chronic hypercarbic and hypoxemic respiratory failure. She has a 4L baseline for supplemental oxygen , COPD,  CAD, asthma, chronic iron  deficiency anemia, depression and lung cancer. Pt presented to the ED on 10/24/2023 with left-sided chest pain with progressive SOB that had been going on for several days. Pt has had a nonproductive cough for several weeks. Pt has had black stools possibly containing blood noted by her home health nurse. Pt is not taking any anticoagulation medications but does take iron  for anemia. Shortly after arrival to [facility] pt's hemoglobin levels decreased and suspected GI bleed was noted given NSAID usage and black stool noted while at [facility]. However, due to concerns about pt's respiratory state an EGD and colonoscopy was not performed and pt is being treated conservatively.   Pulmonology has high concerns for PE so Heparin  was  order to start per Pharmacy.   Goal of Therapy:  Heparin  level 0.3-0.7 units/ml (would aim for closer to ~0.5 given possible GIB) Monitor platelets by anticoagulation protocol: Yes   Plan:  Give 4000 units bolus x 1 Start heparin  infusion at 850 units/hr Check anti-Xa level in 8 hours and daily while on heparin  Continue to monitor H&H and platelets  Veleria Ahle 10/28/2023,2:19 PM

## 2023-10-28 NOTE — Plan of Care (Signed)

## 2023-10-28 NOTE — Progress Notes (Signed)
   10/28/23 1309  Provider Notification  Provider Name/Title Dr. Willette  Date Provider Notified 10/28/23  Time Provider Notified 1310  Method of Notification Page  Notification Reason Critical Result  Test performed and critical result Pco2 83  Date Critical Result Received 10/28/23  Time Critical Result Received 1305  Provider response No new orders

## 2023-10-28 NOTE — Progress Notes (Signed)
 PROGRESS NOTE    Patient: Courtney Zuniga                            PCP: Myra Geni ORN, FNP                    DOB: 04/03/1951            DOA: 10/24/2023 FMW:[id number]             DOS: 10/28/2023, 11:27 AM   LOS: 4 days   Date of Service: The patient was seen and examined on 10/28/2023  Subjective:   The patient was seen and examined this morning, continue to complain of shortness of breath, tachypneic, tachycardic Progressive shortness of breath at rest, currently satting 100% on 5 L of oxygen  Baseline 4 L  Hemoglobin stable at 9.7 7/13 S/p 2U PRBC blood transfusion,   Brief Narrative:   Chenita Piercefield is a 73 y.o. female with medical history significant of COPD, asthma, chronic respiratory failure with hypoxia on supplemental oxygen  via [place] at 4 LPM, coronary artery disease, depression and lung cancer who presents to the emergency department due to several days onset of left-sided chest pain with progressive shortness of breath and several weeks of nonproductive cough.  She was noted to have black stool and her home health nurse states that this may be due to blood in her stool.  Patient endorsed taking Pepto-Bismol recently.   ED Course: - Tachypneic with respiratory rate of 35/min, tachycardic with HR of 103 bpm, O2 sat 97% on 4 LPM,  WBC 7.8, hemoglobin 8.0, hematocrit 26.9, MCV 78.0, platelets 408.  BMP was normal except for chloride of 94 and blood glucose of 107, albumin  3.3, ALP 187.   troponin x 2 - 12 > 11. FOBT positive,  Chest x-ray showed new airspace opacities in the left lower lobe, suspicious for pneumonia. Stable focal opacity in the right mid lung and linear atelectasis or scarring in the right lung base. Patient was treated with IV ceftriaxone  and azithromycin , breathing treatment with DuoNeb was provided.       Assessment & Plan:   Principal Problem:   CAP (community acquired pneumonia) Active Problems:   COPD exacerbation (HCC)   Essential hypertension    Iron  deficiency anemia   Adenocarcinoma of right lung, stage 4   Acute on chronic respiratory failure with hypoxia (HCC)   Rectal bleeding   Microcytic anemia   Hypoalbuminemia due to protein-calorie malnutrition (HCC)      Acute on chronic respiratory failure due to COPD exacerbation/pneumonia - Remainder in resp distress -tachypneic, tachycardic -O2 demand up from 4 L, to 5 L of oxygen , with progressive shortness of breath at rest Currently satting 100% -Switching back to IV steroids Solu-Medrol  40 twice daily, continue IV Lasix    -Continue supplemental oxygen , maintaining O2 sat 88-92%  -last ABG reviewed, pCO2 78--- reducing oxygen  -Continue mucolytics,  - IV antibiotics Rocephin /azithromycin  switched to p.o. Levaquin   Community-acquired pneumonia ammonia -Chest x-ray was suspicious for pneumonia -Was on ceftriaxone  azithromycin , --switched to p.o. Levaquin  - Cultures-no growth to date -  Procalcitonin <0.10 Continue Mucinex , incentive spirometry, flutter valve    Acute exacerbation of COPD Acute on chronic respiratory failure with hypoxia  Continue Xopenex , Atrovent , Mucinex , Solu-Medrol , Levaquin  Continue Protonix  to prevent steroid-induced ulcer Continue incentive spirometry and flutter valve Continue supplemental oxygen  to maintain O2 sat > 94%    Rectal bleeding-no history of anemia of  chronic disease-deficiency anemia Hemoccult was positive    Latest Ref Rng & Units 10/27/2023    4:24 AM 10/26/2023    3:35 AM 10/25/2023    9:08 AM  CBC  WBC 4.0 - 10.5 K/uL 10.9  9.8    Hemoglobin 12.0 - 15.0 g/dL 9.9  9.3  7.3   Hematocrit 36.0 - 46.0 % 34.1  31.7  25.3   Platelets 150 - 400 K/uL 412  356     Patient endorsed taking ferrous sulfate  for iron  deficiency anemia Was on IV Protonix  40 mg twice daily>> switching to p.o. Gastroenterologist consulted-recommending outpatient follow-up and workup   Since patient is tachypneic, tachycardic, hypotensive-  10/25/2023  s/p 2U PRBC blood transfusion    Microcytic anemia-with Sever iron  deficiency anemia Iron /TIBC/Ferritin/ %Sat    Component Value Date/Time   IRON  7 (L) 10/25/2023 0551   TIBC 358 10/25/2023 0551   FERRITIN 5 (L) 10/25/2023 0551   IRONPCTSAT 2 (L) 10/25/2023 0551  - S/p IV iron -infusion x 3 - Supplement p.o. iron  on discharge    Hypoalbuminemia possibly secondary to mild protein calorie malnutrition Albumin  3.3, protein supplement will be provided   Essential hypertension Patient was not taking bisoprolol  Continue blood pressure monitoring   Adenocarcinoma of right lung, stage 4 Patient follows up as outpatient with oncology (Dr. Gatha)     Transferring out of ICU 10/27/2023 ----------------------------------------------------------------------------------------------------------------- Nutritional status:  The patient's BMI is: Body mass index is 20.35 kg/m. I agree with the assessment and plan as outlined   Cultures; Blood Cultures x 2 >> no growth to date -------------------------------------------------------------------------------------------------------------------------------------------  DVT prophylaxis:  SCDs Start: 10/25/23 0124   Code Status:   Code Status: Full Code  Family Communication: No family member present at bedside- -Advance care planning has been discussed.   Admission status:   Status is: Inpatient Remains inpatient appropriate because: Needing breathing treatments, respiratory support   Disposition: From  - home             Planning for discharge in 1 days: to home w [facility]   Procedures:   No admission procedures for hospital encounter.   Antimicrobials:  Anti-infectives (From admission, onward)    Start     Dose/Rate Route Frequency Ordered Stop   10/28/23 1200  levofloxacin  (LEVAQUIN ) tablet 750 mg  Status:  Discontinued        750 mg Oral Daily 10/27/23 0958 10/27/23 1001   10/28/23 1000  levofloxacin  (LEVAQUIN ) tablet 750 mg         750 mg Oral Daily 10/27/23 1001 10/28/23 0805   10/27/23 1100  cefTRIAXone  (ROCEPHIN ) 2 g in sodium chloride  0.9 % 100 mL IVPB        2 g 200 mL/hr over 30 Minutes Intravenous Every 24 hours 10/27/23 1000 10/27/23 1141   10/26/23 1215  azithromycin  (ZITHROMAX ) tablet 500 mg  Status:  Discontinued        500 mg Oral Daily 10/26/23 1119 10/27/23 1000   10/25/23 1000  cefTRIAXone  (ROCEPHIN ) 2 g in sodium chloride  0.9 % 100 mL IVPB  Status:  Discontinued        2 g 200 mL/hr over 30 Minutes Intravenous Every 24 hours 10/25/23 0126 10/27/23 0958   10/24/23 2145  cefTRIAXone  (ROCEPHIN ) 1 g in sodium chloride  0.9 % 100 mL IVPB        1 g 200 mL/hr over 30 Minutes Intravenous  Once 10/24/23 2136 10/24/23 2300   10/24/23 2145  azithromycin  (ZITHROMAX ) 500 mg in  sodium chloride  0.9 % 250 mL IVPB  Status:  Discontinued        500 mg 250 mL/hr over 60 Minutes Intravenous Every 24 hours 10/24/23 2136 10/26/23 1119        Medication:   budesonide  (PULMICORT ) nebulizer solution  0.25 mg Nebulization BID   Chlorhexidine  Gluconate Cloth  6 each Topical Q0600   dextromethorphan -guaiFENesin   1 tablet Oral BID   feeding supplement  237 mL Oral BID BM   ferrous sulfate   325 mg Oral BID WC   furosemide   40 mg Intravenous Q12H   levalbuterol   0.63 mg Nebulization Q6H   linaclotide   145 mcg Oral QAC breakfast   megestrol   400 mg Oral BID   methylPREDNISolone  (SOLU-MEDROL ) injection  40 mg Intravenous BID   pantoprazole   40 mg Oral BID   senna-docusate  2 tablet Oral QHS    acetaminophen  **OR** acetaminophen , HYDROmorphone  (DILAUDID ) injection, ipratropium, melatonin, ondansetron  **OR** ondansetron  (ZOFRAN ) IV, oxyCODONE    Objective:   Vitals:   10/28/23 1117 10/28/23 1119 10/28/23 1120 10/28/23 1121  BP:      Pulse:   (!) 109   Resp: 20 (!) 25 (!) 23   Temp:    99.3 F (37.4 C)  TempSrc:    Oral  SpO2:   100%   Weight:      Height:        Intake/Output Summary (Last 24 hours) at  10/28/2023 1127 Last data filed at 10/28/2023 0300 Gross per 24 hour  Intake 604 ml  Output 750 ml  Net -146 ml   Filed Weights   10/25/23 1727  Weight: 57.2 kg     Physical examination:          General:  AAO x 3,  cooperative, progressive shortness of breath, chronic ill looking cachectic female  HEENT:  Normocephalic, PERRL, otherwise with in Normal limits   Neuro:  CNII-XII intact. , normal motor and sensation, reflexes intact   Lungs:   Shortness of breath with 5 L O2 by nasal cannula Mildly labored breathing, diffuse wheezing, rhonchi  Cardio:    S1/S2, RRR, No murmure, No Rubs or Gallops   Abdomen:  Soft, non-tender, bowel sounds active all four quadrants, no guarding or peritoneal signs.  Muscular  skeletal:  Limited exam -global generalized weaknesses - in bed, able to move all 4 extremities,   2+ pulses,  symmetric, No pitting edema  Skin:  Dry, warm to touch, negative for any Rashes,  Wounds: Please see nursing documentation          ------------------------------------------------------------------------------------------------------------------    LABs:     Latest Ref Rng & Units 10/28/2023    4:14 AM 10/27/2023    4:24 AM 10/26/2023    3:35 AM  CBC  WBC 4.0 - 10.5 K/uL 9.9  10.9  9.8   Hemoglobin 12.0 - 15.0 g/dL 9.7  9.9  9.3   Hematocrit 36.0 - 46.0 % 33.2  34.1  31.7   Platelets 150 - 400 K/uL 440  412  356       Latest Ref Rng & Units 10/28/2023    4:14 AM 10/27/2023    4:24 AM 10/26/2023    3:35 AM  CMP  Glucose 70 - 99 mg/dL 876  860  859   BUN 8 - 23 mg/dL 18  23  15    Creatinine 0.44 - 1.00 mg/dL 9.66  9.48  9.56   Sodium 135 - 145 mmol/L 142  141  140  Potassium 3.5 - 5.1 mmol/L 4.2  5.4  4.5   Chloride 98 - 111 mmol/L 86  93  96   CO2 22 - 32 mmol/L 44  39  36   Calcium  8.9 - 10.3 mg/dL 9.0  9.0  8.5   Total Protein 6.5 - 8.1 g/dL 6.2  6.7  6.0   Total Bilirubin 0.0 - 1.2 mg/dL 0.4  0.3  0.8   Alkaline Phos 38 - 126 U/L 148  159   157   AST 15 - 41 U/L 13  17  19    ALT 0 - 44 U/L 17  21  22         Micro Results Recent Results (from the past 240 hours)  Culture, blood (routine x 2) Call MD if unable to obtain prior to antibiotics being given     Status: None (Preliminary result)   Collection Time: 10/25/23  2:00 AM   Specimen: BLOOD RIGHT HAND  Result Value Ref Range Status   Specimen Description BLOOD RIGHT HAND  Final   Special Requests   Final    BOTTLES DRAWN AEROBIC AND ANAEROBIC Blood Culture adequate volume   Culture   Final    NO GROWTH 3 DAYS Performed at [facility], [address] [place], [place] [address]    Report Status PENDING  Incomplete  Culture, blood (routine x 2) Call MD if unable to obtain prior to antibiotics being given     Status: None (Preliminary result)   Collection Time: 10/25/23  2:06 AM   Specimen: BLOOD LEFT HAND  Result Value Ref Range Status   Specimen Description BLOOD LEFT HAND  Final   Special Requests   Final    BOTTLES DRAWN AEROBIC AND ANAEROBIC Blood Culture adequate volume   Culture   Final    NO GROWTH 3 DAYS Performed at [facility], [address], [place], [place] [address]    Report Status PENDING  Incomplete  MRSA Next Gen by PCR, Nasal     Status: None   Collection Time: 10/25/23  4:11 PM   Specimen: Nasal Mucosa; Nasal Swab  Result Value Ref Range Status   MRSA by PCR Next Gen NOT DETECTED NOT DETECTED Final    Comment: (NOTE) The GeneXpert MRSA Assay (FDA approved for NASAL specimens only), is one component of a comprehensive MRSA colonization surveillance program. It is not intended to diagnose MRSA infection nor to guide or monitor treatment for MRSA infections. Test performance is not FDA approved in patients less than 29 years old. Performed at [facility], [address] [place], [place] [address]   Respiratory (~20 pathogens) panel by PCR     Status: None   Collection Time: 10/26/23  1:41 AM   Specimen: Nasopharyngeal Swab; Respiratory   Result Value Ref Range Status   Adenovirus NOT DETECTED NOT DETECTED Final   Coronavirus 229E NOT DETECTED NOT DETECTED Final    Comment: (NOTE) The Coronavirus on the Respiratory Panel, DOES NOT test for the novel  Coronavirus (2019 nCoV)    Coronavirus HKU1 NOT DETECTED NOT DETECTED Final   Coronavirus NL63 NOT DETECTED NOT DETECTED Final   Coronavirus OC43 NOT DETECTED NOT DETECTED Final   Metapneumovirus NOT DETECTED NOT DETECTED Final   Rhinovirus / Enterovirus NOT DETECTED NOT DETECTED Final   Influenza A NOT DETECTED NOT DETECTED Final   Influenza B NOT DETECTED NOT DETECTED Final   Parainfluenza Virus 1 NOT DETECTED NOT DETECTED Final   Parainfluenza Virus 2  NOT DETECTED NOT DETECTED Final   Parainfluenza Virus 3 NOT DETECTED NOT DETECTED Final   Parainfluenza Virus 4 NOT DETECTED NOT DETECTED Final   Respiratory Syncytial Virus NOT DETECTED NOT DETECTED Final   Bordetella pertussis NOT DETECTED NOT DETECTED Final   Bordetella Parapertussis NOT DETECTED NOT DETECTED Final   Chlamydophila pneumoniae NOT DETECTED NOT DETECTED Final   Mycoplasma pneumoniae NOT DETECTED NOT DETECTED Final    Comment: Performed at [facility], [address] [place], [place] [address]    Radiology Reports No results found.   SIGNED: Adriana DELENA Grams, MD, FHM. FAAFP. [facility] - [facility] Critical care time spent - 55 min.  In seeing, evaluating and examining the patient. Reviewing medical records, labs, drawn plan of care. Managing acute on chronic respiratory failure-   [facility],  Pager (please use [email or web address] to page/ text) Please use Epic Secure Chat for non-urgent communication (7AM-7PM)  If 7PM-7AM, please contact night-coverage [email or web address], 10/28/2023, 11:27 AM

## 2023-10-28 NOTE — Consult Note (Signed)
 NAMEMerisa Zuniga, MRN:  [id number], DOB:  1951/01/18, LOS: 4 ADMISSION DATE:  10/24/2023, CONSULTATION DATE:  10/28/23 REFERRING MD:  TRH, CHIEF COMPLAINT:  tachypnea   History of Present Illness:  73 year old woman history of chronic hypercarbic and hypoxemic respiratory failure, 4 L baseline, COPD, most recent spirometry 2017 moderate severity likely worsened over time, presented to hospital with concern for pneumonia.  History obtained via chart.  Unable to communicate reliably via camera and with BiPAP mask.  Came to the hospital respirator stress.  Left lower lobe minimal infiltrates noted on chest x-ray.  Started on antibiotics and steroids.  No real improvement.  Started be aggressively diuresed.  No real improvement.  Now requiring BiPAP for work of breathing.  Hypoxemia not that much worse.  Repeat chest x-ray on my review interpretation shows improvement in left sided infiltrates that were there previously on admission.  She is tachycardic.  She has had serial blood gases that reveal well compensated respiratory acidosis and hypercarbia.  Worsening metabolic alkalosis with diuresis and hyperchloremia.  Per discussion with hospital she appears dry on exam.  She has a history of active malignancy although stable.  With worsening respiratory status with relatively stable chest x-ray as well as concomitant tachycardia, high concern for PE/VTE.  Pertinent  Medical History  Emphysema, chronic respiratory failure 4 L nasal cannula, lung cancer currently being observed with no recent recurrence of disease  Significant Hospital Events: Including procedures, antibiotic start and stop dates in addition to other pertinent events   10/24/2023 admitted to the hospital with acute on chronic respite failure felt to be related to pneumonia 7/16 PCCM consult with worsening clinical trajectory despite aggressive diuresis, antibiotics, oral steroids  Interim History / Subjective:    Objective     Blood pressure 122/78, pulse (!) 114, temperature 99.3 F (37.4 C), temperature source Oral, resp. rate 13, height 5' 6 (1.676 m), weight 57.2 kg, SpO2 100%.    Vent Mode: BIPAP FiO2 (%):  [45 %] 45 % PEEP:  [5 cmH20] 5 cmH20 Pressure Support:  [9 cmH20] 9 cmH20   Intake/Output Summary (Last 24 hours) at 10/28/2023 1327 Last data filed at 10/28/2023 0300 Gross per 24 hour  Intake 604 ml  Output 750 ml  Net -146 ml   Filed Weights   10/25/23 1727  Weight: 57.2 kg    Examination: General: Lying in hospital bed, appears acute on chronically ill HENT: BiPAP in place Lungs: Clearly tachypneic with mild accessory muscle use Cardiovascular: Tachycardic Abdomen: Appears nondistended Extremities: Unable to evaluate Neuro: Unable to evaluate   Resolved problem list   Assessment and Plan   Acute on chronic hypoxemic and hypercarbic respiratory failure: Unclear etiology.  Initial chest x-ray with possible left-sided infiltrates.  Treated for pneumonia is reasonable.  Unfortunately antibiotics, and steroids for presumed COPD exacerbation, there is been no significant improvement.  In fact it seems clinically she is worsening.  Hypoxemia relatively stable but increased work of breathing.  Diuresis was started and again no improvement, if anything worsening.  On exam she is clear tachypneic on BiPAP.  Seems to be getting reasonable volumes on my review of the ventilator screen. -- Continue steroids, IV Solu-Medrol  -- Nebulized LAMA LABA ICS, as needed short acting bronchodilators -- Completed the course of antibiotics, procalcitonin negative, chest x-ray looks improved compared to admission, left-sided, suspect this may just be atelectasis, regardless hard to justify additional antibiotics -- Would stop IV Lasix  given worsening metabolic alkalosis, acute on chronic  related to hyperchloremia on top of chronic compensatory metabolic alkalosis in the setting of chronic respiratory acidosis --  Start empiric heparin  for possible PE given relatively clear parenchyma and worsening symptoms, if below studies are negative can discontinue anticoagulation -- Lower extremity Dopplers, CTA PE protocol ordered stat for evaluation -- She is at high risk for decompensation, may need intubation if not improving -- Consider palliative care consult, most recent palliative care note 6/9 indicates she would want to be DNR, she had no desire to go to SNF and with return home, I would reengage palliative care for these discussions as DNR seems most appropriate and if she were to worsen and require ventilation she most certainly would require nursing facility care for at least short-term if not longer   Best Practice (right click and Reselect all SmartList Selections daily)   Per primary Labs   CBC: Recent Labs  Lab 10/24/23 2038 10/25/23 0551 10/25/23 0908 10/26/23 0335 10/27/23 0424 10/28/23 0414  WBC 7.8 7.7  --  9.8 10.9* 9.9  NEUTROABS 6.5  --   --   --   --   --   HGB 8.0* 7.4* 7.3* 9.3* 9.9* 9.7*  HCT 26.9* 25.3* 25.3* 31.7* 34.1* 33.2*  MCV 78.0* 77.8*  --  80.5 83.4 82.8  PLT 408* 376  --  356 412* 440*    Basic Metabolic Panel: Recent Labs  Lab 10/24/23 2038 10/25/23 0551 10/26/23 0335 10/27/23 0424 10/28/23 0414  NA 138 136 140 141 142  K 4.1 4.1 4.5 5.4* 4.2  CL 94* 93* 96* 93* 86*  CO2 32 32 36* 39* 44*  GLUCOSE 107* 111* 140* 139* 123*  BUN 12 20 15 23 18   CREATININE 0.51 0.38* 0.43* 0.51 0.33*  CALCIUM  8.8* 8.4* 8.5* 9.0 9.0  MG  --  1.7  --   --   --   PHOS  --  3.7  --   --   --    GFR: Estimated Creatinine Clearance: 55.7 mL/min (A) (by C-G formula based on SCr of 0.33 mg/dL (L)). Recent Labs  Lab 10/25/23 0551 10/26/23 0335 10/27/23 0424 10/28/23 0414  PROCALCITON <0.10  --   --   --   WBC 7.7 9.8 10.9* 9.9    Liver Function Tests: Recent Labs  Lab 10/24/23 2038 10/25/23 0551 10/26/23 0335 10/27/23 0424 10/28/23 0414  AST 19 20 19 17  13*   ALT 19 20 22 21 17   ALKPHOS 187* 168* 157* 159* 148*  BILITOT 0.2 0.5 0.8 0.3 0.4  PROT 6.5 5.9* 6.0* 6.7 6.2*  ALBUMIN  3.3* 3.0* 3.0* 3.2* 3.2*   No results for input(s): LIPASE, AMYLASE in the last 168 hours. No results for input(s): AMMONIA in the last 168 hours.  ABG    Component Value Date/Time   PHART 7.43 10/28/2023 1244   PCO2ART 83 (HH) 10/28/2023 1244   PO2ART 109 (H) 10/28/2023 1244   HCO3 55.1 (H) 10/28/2023 1244   TCO2 23 10/19/2010 1852   O2SAT 98.7 10/28/2023 1244     Coagulation Profile: No results for input(s): INR, PROTIME in the last 168 hours.  Cardiac Enzymes: No results for input(s): CKTOTAL, CKMB, CKMBINDEX, TROPONINI in the last 168 hours.  HbA1C: Hgb A1c MFr Bld  Date/Time Value Ref Range Status  02/16/2014 02:54 PM 5.7 (H) <5.7 % Final    Comment:    (NOTE)  According to the ADA Clinical Practice Recommendations for 2011, when HbA1c is used as a screening test:  >=6.5%   Diagnostic of Diabetes Mellitus           (if abnormal result is confirmed) 5.7-6.4%   Increased risk of developing Diabetes Mellitus References:Diagnosis and Classification of Diabetes Mellitus,Diabetes Care,2011,34(Suppl 1):S62-S69 and Standards of Medical Care in         Diabetes - 2011,Diabetes Care,2011,34 (Suppl 1):S11-S61.     CBG: No results for input(s): GLUCAP in the last 168 hours.  Review of Systems:   Unable to obtain due to patient factors, BiPAP, remote camera and difficulty communicating  Past Medical History:  She,  has a past medical history of Anemia, Arthritis, Asthma, Brain tumor (benign) (HCC) ([facility]), Chronic headaches, Chronic hip pain, Chronic pain, COPD (chronic obstructive pulmonary disease) (HCC), Coronary artery disease, Depression, Depression (05/15/2016), Encounter for antineoplastic chemotherapy (01/10/2016), GERD (gastroesophageal reflux disease),  Hypertension, Lung cancer (HCC) (dx'd 01/2016), NSTEMI (non-ST elevated myocardial infarction) (HCC) (yrs ago), On home O2, Pneumonia (last time 2 yrs ago), and Shortness of breath dyspnea.   Surgical History:   Past Surgical History:  Procedure Laterality Date   CHOLECYSTECTOMY     COLONOSCOPY  2015   Results requested from [facility]   COLONOSCOPY     ESOPHAGOGASTRODUODENOSCOPY N/A 08/14/2015   Procedure: ESOPHAGOGASTRODUODENOSCOPY (EGD);  Surgeon: Lamar CHRISTELLA Hollingshead, MD;  Location: [facility];  Service: Endoscopy;  Laterality: N/A;  215    ESOPHAGOGASTRODUODENOSCOPY (EGD) WITH PROPOFOL  N/A 09/13/2015   Procedure: ESOPHAGOGASTRODUODENOSCOPY (EGD) WITH PROPOFOL ;  Surgeon: Toribio SHAUNNA Cedar, MD;  Location: [facility];  Service: Endoscopy;  Laterality: N/A;   ESOPHAGOGASTRODUODENOSCOPY (EGD) WITH PROPOFOL  N/A 05/19/2022   Procedure: ESOPHAGOGASTRODUODENOSCOPY (EGD) WITH PROPOFOL ;  Surgeon: Cindie Carlin POUR, DO;  Location: [facility];  Service: Endoscopy;  Laterality: N/A;   EUS N/A 03/12/2017   Procedure: UPPER ENDOSCOPIC ULTRASOUND (EUS) RADIAL;  Surgeon: Cedar Toribio SHAUNNA, MD;  Location: [facility];  Service: Endoscopy;  Laterality: N/A;   HOT HEMOSTASIS  05/19/2022   Procedure: HOT HEMOSTASIS (ARGON PLASMA COAGULATION/BICAP);  Surgeon: Cindie Carlin POUR, DO;  Location: [facility];  Service: Endoscopy;;   IR IMAGING GUIDED PORT INSERTION  01/20/2022   TUMOR REMOVAL  2005   Benign   UPPER ESOPHAGEAL ENDOSCOPIC ULTRASOUND (EUS)  09/13/2015   Procedure: UPPER ESOPHAGEAL ENDOSCOPIC ULTRASOUND (EUS);  Surgeon: Toribio SHAUNNA Cedar, MD;  Location: [facility];  Service: Endoscopy;;   VIDEO BRONCHOSCOPY WITH ENDOBRONCHIAL NAVIGATION N/A 12/31/2015   Procedure: VIDEO BRONCHOSCOPY WITH ENDOBRONCHIAL NAVIGATION;  Surgeon: Elspeth JAYSON Millers, MD;  Location: [facility];  Service: Thoracic;  Laterality: N/A;   VIDEO BRONCHOSCOPY WITH ENDOBRONCHIAL ULTRASOUND N/A 11/08/2015   Procedure: VIDEO  BRONCHOSCOPY WITH ENDOBRONCHIAL ULTRASOUND;  Surgeon: Maude Fleeta Ochoa, MD;  Location: [facility] OR;  Service: Thoracic;  Laterality: N/A;     Social History:   reports that she quit smoking about 5 years ago. Her smoking use included cigarettes. She started smoking about 43 years ago. She has a 38 pack-year smoking history. She has never used smokeless tobacco. She reports that she does not drink alcohol and does not use drugs.   Family History:  Her family history includes Brain cancer in her sister; Lung cancer in her brother, brother, and father; Ovarian cancer in her mother; Prostate cancer in her brother. There is no history of Colon cancer or Colon polyps.   Allergies Allergies  Allergen Reactions   Desyrel  [Trazodone ] Other (  See Comments)    Made me faint     Home Medications  Prior to Admission medications   Medication Sig Start Date End Date Taking? Authorizing Provider  Aspirin -Salicylamide-Caffeine  (BC HEADACHE POWDER PO) Take 1 packet by mouth as needed (headache).   Yes [provider]  benzonatate (TESSALON) 100 MG capsule Take 100 mg by mouth 3 (three) times daily as needed for cough. 05/22/23  Yes [provider]  bisoprolol  (ZEBETA ) 5 MG tablet Take 0.5 tablets (2.5 mg total) by mouth daily. 09/22/23  Yes Johnson, Clanford L, MD  docusate sodium  (COLACE) 100 MG capsule Take 1 capsule (100 mg total) by mouth 2 (two) times daily. 08/12/23 08/11/24 Yes Shah, Pratik D, DO  ENSURE (ENSURE) Take 237 mLs by mouth 3 (three) times daily between meals.   Yes [provider]  Fluticasone -Umeclidin-Vilant (TRELEGY ELLIPTA ) 100-62.5-25 MCG/ACT AEPB INHALE 1 PUFF BY MOUTH EVERY DAY   Yes [provider]  furosemide  (LASIX ) 20 MG tablet Take 1 tablet (20 mg total) by mouth every Monday, Wednesday, and Friday. 09/23/23  Yes Johnson, Clanford L, MD  Ipratropium-Albuterol  (COMBIVENT  RESPIMAT) 20-100 MCG/ACT AERS respimat Inhale 1 puff into the lungs every 6 (six)  hours as needed for wheezing or shortness of breath. 08/28/22  Yes Emokpae, Courage, MD  megestrol  (MEGACE ) 40 MG/ML suspension Take 400 mg by mouth 3 (three) times daily. 10/01/23  Yes [provider]  Polyethylene Glycol 3350  (PEG 3350 ) 17 GM/SCOOP POWD Take 17 g by mouth daily. 10/21/23  Yes [provider]  traZODone  (DESYREL ) 50 MG tablet Take 1 tablet (50 mg total) by mouth at bedtime. 08/28/22  Yes Pearlean Manus, MD  ferrous sulfate  325 (65 FE) MG tablet Take 1 tablet (325 mg total) by mouth daily with breakfast. 08/13/23 09/18/23  Maree, Pratik D, DO  HYDROcodone  bit-homatropine (HYCODAN) 5-1.5 MG/5ML syrup Take 5 mLs by mouth every 6 (six) hours as needed for cough. 11/10/21   Sherrod Sherrod, MD     Critical care time:     CRITICAL CARE Performed by: Donnice JONELLE Beals   Total critical care time: 35 minutes  Critical care time was exclusive of separately billable procedures and treating other patients.  Critical care was necessary to treat or prevent imminent or life-threatening deterioration.  Critical care was time spent personally by me on the following activities: development of treatment plan with patient and/or surrogate as well as nursing, discussions with consultants, evaluation of patient's response to treatment, examination of patient, obtaining history from patient or surrogate, ordering and performing treatments and interventions, ordering and review of laboratory studies, ordering and review of radiographic studies, pulse oximetry and re-evaluation of patient's condition.  Donnice JONELLE Beals, MD See TRACEY

## 2023-10-28 NOTE — Progress Notes (Signed)
  Acute on chronic progressive hypoxic respiratory failure;  Notified by nursing staff for progressive respiratory failure Patient O2 demand increased to 15 L   Subsequently advised to place the patient on BiPAP 0.5 mg Ativan  was given  Patient was seen and examined: Awake alert or oriented, with significant shortness of breath, on BiPAP  ABG    Component Value Date/Time   PHART 7.43 10/28/2023 1244   PCO2ART 83 (HH) 10/28/2023 1244   PO2ART 109 (H) 10/28/2023 1244   HCO3 55.1 (H) 10/28/2023 1244   TCO2 23 10/19/2010 1852   O2SAT 98.7 10/28/2023 1244   - Continue BiPAP - Discussed the case with the PCCM, Dr. Annella Formally consulted  Follow-up CT angiogram of chest:  IMPRESSION: 1. No evidence for pulmonary embolism. 2. New multifocal airspace consolidation in the bilateral lower lobes worrisome for pneumonia. 3. New 6 mm left apical nodule. 4. Stable spiculated nodular density in the right lung apex.   Scheduled DuoNeb bronchodilators, Pulmicort  nebs twice daily Increased IV steroids  Appreciate critical care team following closely  Patient will be transferred to an to ICU setting.    SIGNED: Adriana DELENA Grams, MD, FHM. FAAFP  Total critical care time spent 55 minutes -occluding discussing the care with critical care team    [facility],  Pager (please use [email or web address] to page/text)  Please use Epic Secure Chat for non-urgent communication (7AM-7PM) If 7PM-7AM, please contact night-coverage [email or web address],  10/28/2023, 4:18 PM

## 2023-10-28 NOTE — Plan of Care (Addendum)
  Problem: Pain Managment: Goal: General experience of comfort will improve and/or be controlled Outcome: Progressing   Problem: Safety: Goal: Ability to remain free from injury will improve Outcome: Progressing

## 2023-10-28 NOTE — Progress Notes (Signed)
 Brief progress note:  No DVT, no PE on studies.  Stopped heparin , discontinued.  Lower lobe consolidations, will expand to Zosyn  given clinical worsening.

## 2023-10-28 NOTE — Consult Note (Signed)
 Consultation Note Date: 10/28/2023   Patient Name: Courtney Zuniga  DOB: November 01, 1950  MRN: [id number]  Age / Sex: 73 y.o., female  PCP: Myra Geni ORN, FNP Referring Physician: Willette Adriana LABOR, MD  Reason for Consultation: Establishing goals of care  HPI/Patient Profile: 73 y.o. female  with past medical history of COPD, asthma, chronic respiratory failure with hypoxia on supplemental oxygen  via [place] at 4 LPM, coronary artery disease, depression and lung cancer admitted on 10/24/2023 with community-acquired pneumonia stage IV right lung cancer.   Clinical Assessment and Goals of Care: I have reviewed medical records including EPIC notes, labs and imaging, received report from RN, assessed the patient.  [name] is lying quietly in bed.  She appears acutely/chronically ill and very frail and thin, cachectic.  She is alert, making and somewhat keeping eye contact.  Her communication is limited due to BiPAP.  Given of time, she can make her needs known.  Bedside nursing staff is present along with respiratory therapy for transport to CT.  We then met at the bedside to discuss diagnosis prognosis, GOC, EOL wishes, disposition and options.  I introduced Palliative Medicine as specialized medical care for people living with serious illness. It focuses on providing relief from the symptoms and stress of a serious illness. The goal is to improve quality of life for both the patient and the family.   We focused on their current illness.  Somewhat limited visit today due to acuity of condition, BiPAP in place, need for CT.  We briefly talked about her respiratory status and the treatment plan.  We talked about CCM consult.  We talked about time for outcomes.  The natural disease trajectory and expectations at EOL were discussed.  Advanced directives, concepts specific to code status, artifical feeding and hydration, and  rehospitalization were considered and discussed.  We discussed the concept of treat the treatable, but allow a natural passing.  [name] endorses DNR/DNI.  She is agreeable to continue BiPAP for few days.  Palliative Care services outpatient were discussed.  [name] is reported to be active with [facility] for outpatient palliative services.  Discussed the importance of continued conversation with family and the medical providers regarding overall plan of care and treatment options, ensuring decisions are within the context of the patient's values and GOCs.  Questions and concerns were addressed.  The family was encouraged to call with questions or concerns.  PMT will continue to support holistically.  Conference with attending, bedside nursing staff, transition of care team related to patient condition, needs, goals of care, disposition.    HCPOA  NEXT OF KIN - [name] states that her daughter, [name], is her healthcare surrogate.     SUMMARY OF RECOMMENDATIONS   Continue to treat the treatable but no CPR or intubation Time for outcomes PMT to follow   Code Status/Advance Care Planning: DNR -verified with patient   Symptom Management:  Per hospitalist, no additional needs at this time.  Palliative Prophylaxis:  Frequent Pain Assessment and  Oral Care  Additional Recommendations (Limitations, Scope, Preferences): Full Scope Treatment  Psycho-social/Spiritual:  Desire for further Chaplaincy support:no Additional Recommendations: Caregiving  Support/Resources  Prognosis:  Unable to determine, based on outcomes.  Guarded at this point based on chronic illness burden and for hospital stays, 1 ED visit in the last 6 months.  Discharge Planning: To Be Determined      Primary Diagnoses: Present on Admission:  CAP (community acquired pneumonia)  COPD exacerbation (HCC)  Acute on chronic respiratory failure with hypoxia (HCC)  Essential hypertension   Adenocarcinoma of right lung, stage 4  Iron  deficiency anemia  Acute on chronic respiratory failure (HCC)   I have reviewed the medical record, interviewed the patient and family, and examined the patient. The following aspects are pertinent.  Past Medical History:  Diagnosis Date   Anemia    as a young woman   Arthritis    osteoartritis   Asthma    Brain tumor (benign) (HCC) 2005 [facility]   Benign   Chronic headaches    Chronic hip pain    Chronic pain    COPD (chronic obstructive pulmonary disease) (HCC)    Coronary artery disease    Depression    Depression 05/15/2016   Encounter for antineoplastic chemotherapy 01/10/2016   GERD (gastroesophageal reflux disease)    Hypertension    Lung cancer (HCC) dx'd 01/2016   currently on chemo and radiation    NSTEMI (non-ST elevated myocardial infarction) (HCC) yrs ago   On home O2    qhs 2 liters at hs and prn   Pneumonia last time 2 yrs ago   Shortness of breath dyspnea    with activity   Social History   Socioeconomic History   Marital status: Single    Spouse name: Not on file   Number of children: Not on file   Years of education: 10th grade   Highest education level: Not on file  Occupational History   Occupation: retired  Tobacco Use   Smoking status: Former    Current packs/day: 0.00    Average packs/day: 1 pack/day for 38.0 years (38.0 ttl pk-yrs)    Types: Cigarettes    Start date: 07/25/1980    Quit date: 07/26/2018    Years since quitting: 5.2   Smokeless tobacco: Never   Tobacco comments:    Pt has asked doctor for med to help   Vaping Use   Vaping status: Never Used  Substance and Sexual Activity   Alcohol use: No    Alcohol/week: 0.0 standard drinks of alcohol   Drug use: No   Sexual activity: Not Currently  Other Topics Concern   Not on file  Social History Narrative   Not on file   Social Drivers of Health   Financial Resource Strain: Low Risk  (05/05/2018)   Overall Financial Resource Strain  (CARDIA)    Difficulty of Paying Living Expenses: Not very hard  Food Insecurity: No Food Insecurity (10/25/2023)   Hunger Vital Sign    Worried About Running Out of Food in the Last Year: Never true    Ran Out of Food in the Last Year: Never true  Transportation Needs: No Transportation Needs (10/25/2023)   PRAPARE - Administrator, Civil Service (Medical): No    Lack of Transportation (Non-Medical): No  Physical Activity: Insufficiently Active (05/05/2018)   Exercise Vital Sign    Days of Exercise per Week: 5 days    Minutes of Exercise per Session: 10  min  Stress: No Stress Concern Present (05/05/2018)   [employer] of Occupational Health - Occupational Stress Questionnaire    Feeling of Stress : Not at all  Social Connections: Moderately Isolated (10/25/2023)   Social Connection and Isolation Panel    Frequency of Communication with Friends and Family: Three times a week    Frequency of Social Gatherings with Friends and Family: Three times a week    Attends Religious Services: 1 to 4 times per year    Active Member of Clubs or Organizations: No    Attends Banker Meetings: Never    Marital Status: Never married   Family History  Problem Relation Age of Onset   Ovarian cancer Mother    Lung cancer Father    Brain cancer Sister        Half sister   Lung cancer Brother    Lung cancer Brother    Prostate cancer Brother    Colon cancer Neg Hx    Colon polyps Neg Hx    Scheduled Meds:  arformoterol   15 mcg Nebulization BID   budesonide  (PULMICORT ) nebulizer solution  0.25 mg Nebulization BID   Chlorhexidine  Gluconate Cloth  6 each Topical Q0600   dextromethorphan -guaiFENesin   1 tablet Oral BID   feeding supplement  237 mL Oral BID BM   ferrous sulfate   325 mg Oral BID WC   heparin   4,000 Units Intravenous Once   [START ON 10/30/2023] linaclotide   145 mcg Oral QAC breakfast   megestrol   400 mg Oral BID   methylPREDNISolone  (SOLU-MEDROL ) injection   80 mg Intravenous BID   pantoprazole   40 mg Oral BID   revefenacin   175 mcg Nebulization Daily   senna-docusate  2 tablet Oral QHS   Continuous Infusions:  heparin      PRN Meds:.acetaminophen  **OR** acetaminophen , ALPRAZolam , HYDROmorphone  (DILAUDID ) injection, levalbuterol , melatonin, midazolam , ondansetron  **OR** ondansetron  (ZOFRAN ) IV, oxyCODONE  Medications Prior to Admission:  Prior to Admission medications   Medication Sig Start Date End Date Taking? Authorizing Provider  Aspirin -Salicylamide-Caffeine  (BC HEADACHE POWDER PO) Take 1 packet by mouth as needed (headache).   Yes [provider]  benzonatate (TESSALON) 100 MG capsule Take 100 mg by mouth 3 (three) times daily as needed for cough. 05/22/23  Yes [provider]  bisoprolol  (ZEBETA ) 5 MG tablet Take 0.5 tablets (2.5 mg total) by mouth daily. 09/22/23  Yes Johnson, Clanford L, MD  docusate sodium  (COLACE) 100 MG capsule Take 1 capsule (100 mg total) by mouth 2 (two) times daily. 08/12/23 08/11/24 Yes Shah, Pratik D, DO  ENSURE (ENSURE) Take 237 mLs by mouth 3 (three) times daily between meals.   Yes [provider]  Fluticasone -Umeclidin-Vilant (TRELEGY ELLIPTA ) 100-62.5-25 MCG/ACT AEPB INHALE 1 PUFF BY MOUTH EVERY DAY   Yes [provider]  furosemide  (LASIX ) 20 MG tablet Take 1 tablet (20 mg total) by mouth every Monday, Wednesday, and Friday. 09/23/23  Yes Johnson, Clanford L, MD  Ipratropium-Albuterol  (COMBIVENT  RESPIMAT) 20-100 MCG/ACT AERS respimat Inhale 1 puff into the lungs every 6 (six) hours as needed for wheezing or shortness of breath. 08/28/22  Yes Emokpae, Courage, MD  megestrol  (MEGACE ) 40 MG/ML suspension Take 400 mg by mouth 3 (three) times daily. 10/01/23  Yes [provider]  Polyethylene Glycol 3350  (PEG 3350 ) 17 GM/SCOOP POWD Take 17 g by mouth daily. 10/21/23  Yes [provider]  traZODone  (DESYREL ) 50 MG tablet Take 1 tablet (50 mg total) by mouth at bedtime.  08/28/22  Yes  Pearlean Manus, MD  ferrous sulfate  325 (65 FE) MG tablet Take 1 tablet (325 mg total) by mouth daily with breakfast. 08/13/23 09/18/23  Maree, Pratik D, DO  HYDROcodone  bit-homatropine (HYCODAN) 5-1.5 MG/5ML syrup Take 5 mLs by mouth every 6 (six) hours as needed for cough. 11/10/21   Sherrod Sherrod, MD   Allergies  Allergen Reactions   Desyrel  [Trazodone ] Other (See Comments)    Made me faint   Review of Systems  Unable to perform ROS: Acuity of condition    Physical Exam Vitals and nursing note reviewed.  Constitutional:      General: She is not in acute distress.    Appearance: She is ill-appearing.     Comments: Very frail and thin  Pulmonary:     Comments: BiPAP in place Musculoskeletal:     Comments: Frail and thin, cachectic  Skin:    General: Skin is warm and dry.  Neurological:     Mental Status: She is alert.     Comments: BiPAP in place, alert, oriented x 4 per nursing staff at bedside     Vital Signs: BP 122/78   Pulse (!) 114   Temp 99.3 F (37.4 C) (Oral)   Resp 13   Ht 5' 6 (1.676 m)   Wt 57.2 kg   SpO2 100%   BMI 20.35 kg/m  Pain Scale: Faces   Pain Score: 0-No pain   SpO2: SpO2: 100 % O2 Device:SpO2: 100 % O2 Flow Rate: .O2 Flow Rate (L/min): 6 L/min  IO: Intake/output summary:  Intake/Output Summary (Last 24 hours) at 10/28/2023 1413 Last data filed at 10/28/2023 0300 Gross per 24 hour  Intake 604 ml  Output 750 ml  Net -146 ml    LBM: Last BM Date : 10/28/23 Baseline Weight: Weight: 57.2 kg Most recent weight: Weight: 57.2 kg     Palliative Assessment/Data:     Time In: 1330  Time Out: 1410 Time Total: 40 minutes  Greater than 50%  of this time was spent counseling and coordinating care related to the above assessment and plan.  Signed by: Lorenza DELENA Birkenhead, NP   Please contact Palliative Medicine Team phone at [phone] for questions and concerns.  For individual provider: See Tracey

## 2023-10-29 DIAGNOSIS — J189 Pneumonia, unspecified organism: Secondary | ICD-10-CM | POA: Diagnosis not present

## 2023-10-29 DIAGNOSIS — J9622 Acute and chronic respiratory failure with hypercapnia: Secondary | ICD-10-CM | POA: Diagnosis not present

## 2023-10-29 DIAGNOSIS — J9621 Acute and chronic respiratory failure with hypoxia: Secondary | ICD-10-CM | POA: Diagnosis not present

## 2023-10-29 LAB — BLOOD GAS, ARTERIAL
Acid-Base Excess: 23.1 mmol/L — ABNORMAL HIGH (ref 0.0–2.0)
Bicarbonate: 52.3 mmol/L — ABNORMAL HIGH (ref 20.0–28.0)
Drawn by: 28340
O2 Saturation: 96.2 %
Patient temperature: 37
pCO2 arterial: 77 mmHg (ref 32–48)
pH, Arterial: 7.44 (ref 7.35–7.45)
pO2, Arterial: 73 mmHg — ABNORMAL LOW (ref 83–108)

## 2023-10-29 LAB — CBC
HCT: 33.9 % — ABNORMAL LOW (ref 36.0–46.0)
Hemoglobin: 9.6 g/dL — ABNORMAL LOW (ref 12.0–15.0)
MCH: 23.5 pg — ABNORMAL LOW (ref 26.0–34.0)
MCHC: 28.3 g/dL — ABNORMAL LOW (ref 30.0–36.0)
MCV: 82.9 fL (ref 80.0–100.0)
Platelets: 377 K/uL (ref 150–400)
RBC: 4.09 MIL/uL (ref 3.87–5.11)
RDW: 21.4 % — ABNORMAL HIGH (ref 11.5–15.5)
WBC: 8.6 K/uL (ref 4.0–10.5)
nRBC: 0.5 % — ABNORMAL HIGH (ref 0.0–0.2)

## 2023-10-29 MED ORDER — LEVOFLOXACIN 750 MG PO TABS: 750.0000 mg | ORAL_TABLET | Freq: Every day | ORAL | 0 refills | Status: AC

## 2023-10-29 MED ORDER — LACTINEX PO CHEW: 1.0000 | CHEWABLE_TABLET | Freq: Three times a day (TID) | ORAL | 0 refills | Status: AC

## 2023-10-29 MED ORDER — PANTOPRAZOLE SODIUM 40 MG PO TBEC: 40.0000 mg | DELAYED_RELEASE_TABLET | Freq: Two times a day (BID) | ORAL | 0 refills | Status: AC

## 2023-10-29 MED ORDER — METHYLPREDNISOLONE 4 MG PO TBPK: ORAL_TABLET | ORAL | 0 refills | Status: DC

## 2023-10-29 NOTE — Progress Notes (Signed)
 PROGRESS NOTE    Patient: Courtney Zuniga                            PCP: Myra Geni ORN, FNP                    DOB: [birth year]            DOA: 10/24/2023 FMW:[id number]             DOS: 10/29/2023, 10:30 AM   LOS: 5 days   Date of Service: The patient was seen and examined on 10/29/2023  Subjective:   The patient was seen and examined this morning Did not tolerate BiPAP overnight pCO2 83>> 77 Tachycardic with a heart rate 120s, tachypneic with respiratory rate of 37, BP 116/71 satting 92% on 4 L of oxygen   7/13 S/p 2U PRBC blood transfusion,   Brief Narrative:   Terryann Verbeek is a 73 y.o. female with medical history significant of COPD, asthma, chronic respiratory failure with hypoxia on supplemental oxygen  via [place] at 4 LPM, coronary artery disease, depression and lung cancer who presents to the emergency department due to several days onset of left-sided chest pain with progressive shortness of breath and several weeks of nonproductive cough.  She was noted to have black stool and her home health nurse states that this may be due to blood in her stool.  Patient endorsed taking Pepto-Bismol recently.   ED Course: - Tachypneic with respiratory rate of 35/min, tachycardic with HR of 103 bpm, O2 sat 97% on 4 LPM,  WBC 7.8, hemoglobin 8.0, hematocrit 26.9, MCV 78.0, platelets 408.  BMP was normal except for chloride of 94 and blood glucose of 107, albumin  3.3, ALP 187.   troponin x 2 - 12 > 11. FOBT positive,  Chest x-ray showed new airspace opacities in the left lower lobe, suspicious for pneumonia. Stable focal opacity in the right mid lung and linear atelectasis or scarring in the right lung base. Patient was treated with IV ceftriaxone  and azithromycin , breathing treatment with DuoNeb was provided.       Assessment & Plan:   Principal Problem:   CAP (community acquired pneumonia) Active Problems:   COPD exacerbation (HCC)   Essential hypertension   Iron  deficiency anemia    Adenocarcinoma of right lung, stage 4   Acute on chronic respiratory failure with hypoxia (HCC)   Rectal bleeding   Microcytic anemia   Hypoalbuminemia due to protein-calorie malnutrition (HCC)      Acute on chronic respiratory failure due to COPD exacerbation/pneumonia - Remaining respiratory distress, unable to tolerate BiPAP overnight -Back on 4 L of oxygen , satting 92% -ABG reviewed, pCO2 83 >>> 77 today - Continuing DuoNeb bronchodilator treatment, IV steroids, - Discontinued IV Lasix  no signs of volume overload or infection) - Pulmonary critical care consulted, appreciate close follow-up and recommendations - Antibiotics switched from azithromycin  Rocephin  to Zosyn  on 10/28/2023 -Continue supplemental oxygen , maintaining O2 sat 88-92%  -Continue mucolytics,    Community-acquired pneumonia ammonia -Chest x-ray was suspicious for pneumonia -Was on ceftriaxone  azithromycin , --switched to Zosyn  - Cultures-no growth to date -  Procalcitonin <0.10 Continue Mucinex , incentive spirometry, flutter valve    Acute exacerbation of COPD -with acute on chronic respiratory failure -Unable to tolerate BiPAP, on 4 L at baseline satting 92% -Hypercapnic with PCO2 as high as 83, 77 now  Continue Xopenex , Atrovent , Mucinex , Solu-Medrol , IV ABX Zosyn  Continue Protonix  to prevent steroid-induced  ulcer Continue incentive spirometry and flutter valve Continue supplemental oxygen  to maintain O2 sat > 94%    Rectal bleeding-no history of anemia of chronic disease-deficiency anemia Hemoccult was positive    Latest Ref Rng & Units 10/27/2023    4:24 AM 10/26/2023    3:35 AM 10/25/2023    9:08 AM  CBC  WBC 4.0 - 10.5 K/uL 10.9  9.8    Hemoglobin 12.0 - 15.0 g/dL 9.9  9.3  7.3   Hematocrit 36.0 - 46.0 % 34.1  31.7  25.3   Platelets 150 - 400 K/uL 412  356     Patient endorsed taking ferrous sulfate  for iron  deficiency anemia Was on IV Protonix  40 mg twice daily>> switching to  p.o. Gastroenterologist consulted-recommending outpatient follow-up and workup   Since patient is tachypneic, tachycardic, hypotensive-  10/25/2023 s/p 2U PRBC blood transfusion    Microcytic anemia-with Sever iron  deficiency anemia Iron /TIBC/Ferritin/ %Sat    Component Value Date/Time   IRON  7 (L) 10/25/2023 0551   TIBC 358 10/25/2023 0551   FERRITIN 5 (L) 10/25/2023 0551   IRONPCTSAT 2 (L) 10/25/2023 0551  - S/p IV iron -infusion x 3 - Supplement p.o. iron  on discharge    Hypoalbuminemia possibly secondary to mild protein calorie malnutrition Albumin  3.3, protein supplement will be provided   Essential hypertension Patient was not taking bisoprolol  Continue blood pressure monitoring   Adenocarcinoma of right lung, stage 4 Patient follows up as outpatient with oncology (Dr. Gatha)     Transferring out of ICU 10/27/2023>> to stepdown 7/17/ 2025 ----------------------------------------------------------------------------------------------------------------- Nutritional status:  The patient's BMI is: Body mass index is 20.35 kg/m. I agree with the assessment and plan as outlined   Cultures; Blood Cultures x 2 >> no growth to date -------------------------------------------------------------------------------------------------------------------------------------------  DVT prophylaxis:  SCDs Start: 10/25/23 0124   Code Status:   Code Status: Limited: Do not attempt resuscitation (DNR) -DNR-LIMITED -Do Not Intubate/DNI   Family Communication: No family member present at bedside- -Advance care planning has been discussed.   Admission status:   Status is: Inpatient Remains inpatient appropriate because: Needing breathing treatments, respiratory support   Disposition: From  - home             Planning for discharge in 1 days: to home w [facility]   Procedures:   No admission procedures for hospital encounter.   Antimicrobials:  Anti-infectives (From admission,  onward)    Start     Dose/Rate Route Frequency Ordered Stop   10/28/23 1800  piperacillin -tazobactam (ZOSYN ) IVPB 3.375 g        3.375 g 12.5 mL/hr over 240 Minutes Intravenous Every 8 hours 10/28/23 1649     10/28/23 1200  levofloxacin  (LEVAQUIN ) tablet 750 mg  Status:  Discontinued        750 mg Oral Daily 10/27/23 0958 10/27/23 1001   10/28/23 1000  levofloxacin  (LEVAQUIN ) tablet 750 mg        750 mg Oral Daily 10/27/23 1001 10/28/23 0805   10/27/23 1100  cefTRIAXone  (ROCEPHIN ) 2 g in sodium chloride  0.9 % 100 mL IVPB        2 g 200 mL/hr over 30 Minutes Intravenous Every 24 hours 10/27/23 1000 10/27/23 1141   10/26/23 1215  azithromycin  (ZITHROMAX ) tablet 500 mg  Status:  Discontinued        500 mg Oral Daily 10/26/23 1119 10/27/23 1000   10/25/23 1000  cefTRIAXone  (ROCEPHIN ) 2 g in sodium chloride  0.9 % 100 mL IVPB  Status:  Discontinued  2 g 200 mL/hr over 30 Minutes Intravenous Every 24 hours 10/25/23 0126 10/27/23 0958   10/24/23 2145  cefTRIAXone  (ROCEPHIN ) 1 g in sodium chloride  0.9 % 100 mL IVPB        1 g 200 mL/hr over 30 Minutes Intravenous  Once 10/24/23 2136 10/24/23 2300   10/24/23 2145  azithromycin  (ZITHROMAX ) 500 mg in sodium chloride  0.9 % 250 mL IVPB  Status:  Discontinued        500 mg 250 mL/hr over 60 Minutes Intravenous Every 24 hours 10/24/23 2136 10/26/23 1119        Medication:   arformoterol   15 mcg Nebulization BID   budesonide  (PULMICORT ) nebulizer solution  0.25 mg Nebulization BID   Chlorhexidine  Gluconate Cloth  6 each Topical Q0600   dextromethorphan -guaiFENesin   1 tablet Oral BID   feeding supplement  237 mL Oral BID BM   ferrous sulfate   325 mg Oral BID WC   [START ON 10/30/2023] linaclotide   145 mcg Oral QAC breakfast   megestrol   400 mg Oral BID   methylPREDNISolone  (SOLU-MEDROL ) injection  80 mg Intravenous BID   pantoprazole   40 mg Oral BID   revefenacin   175 mcg Nebulization Daily   senna-docusate  2 tablet Oral QHS     acetaminophen  **OR** acetaminophen , ALPRAZolam , HYDROmorphone  (DILAUDID ) injection, levalbuterol , melatonin, midazolam , ondansetron  **OR** ondansetron  (ZOFRAN ) IV, oxyCODONE    Objective:   Vitals:   10/29/23 0600 10/29/23 0727 10/29/23 0731 10/29/23 0900  BP: 131/75   116/61  Pulse: (!) 105   (!) 120  Resp: (!) 22   (!) 37  Temp:  98 F (36.7 C)    TempSrc:  Axillary    SpO2: 96%  94% 92%  Weight:      Height:        Intake/Output Summary (Last 24 hours) at 10/29/2023 1030 Last data filed at 10/28/2023 1818 Gross per 24 hour  Intake 310.03 ml  Output --  Net 310.03 ml   Filed Weights   10/25/23 1727  Weight: 57.2 kg     Physical examination:      General:  AAO x 3,  cooperative, in mild-moderate distress with shortness of breath, Cachectic chronically ill looking female  HEENT:  Normocephalic, PERRL, otherwise with in Normal limits   Neuro:  CNII-XII intact. , normal motor and sensation, reflexes intact   Lungs:   Diffuse mild wheezing, significant rhonchi, diminished breath sounds in lower lobes  Cardio:    S1/S2, RRR, No murmure, No Rubs or Gallops   Abdomen:  Soft, non-tender, bowel sounds active all four quadrants, no guarding or peritoneal signs.  Muscular  skeletal:  Limited exam -global generalized weaknesses - in bed, able to move all 4 extremities,   2+ pulses,  symmetric, No pitting edema  Skin:  Dry, warm to touch, negative for any Rashes,  Wounds: Please see nursing documentation         ------------------------------------------------------------------------------------------------------------------    LABs:     Latest Ref Rng & Units 10/29/2023    4:36 AM 10/28/2023    4:14 AM 10/27/2023    4:24 AM  CBC  WBC 4.0 - 10.5 K/uL 8.6  9.9  10.9   Hemoglobin 12.0 - 15.0 g/dL 9.6  9.7  9.9   Hematocrit 36.0 - 46.0 % 33.9  33.2  34.1   Platelets 150 - 400 K/uL 377  440  412       Latest Ref Rng & Units 10/28/2023    4:14 AM  10/27/2023     4:24 AM 10/26/2023    3:35 AM  CMP  Glucose 70 - 99 mg/dL 876  860  859   BUN 8 - 23 mg/dL 18  23  15    Creatinine 0.44 - 1.00 mg/dL 9.66  9.48  9.56   Sodium 135 - 145 mmol/L 142  141  140   Potassium 3.5 - 5.1 mmol/L 4.2  5.4  4.5   Chloride 98 - 111 mmol/L 86  93  96   CO2 22 - 32 mmol/L 44  39  36   Calcium  8.9 - 10.3 mg/dL 9.0  9.0  8.5   Total Protein 6.5 - 8.1 g/dL 6.2  6.7  6.0   Total Bilirubin 0.0 - 1.2 mg/dL 0.4  0.3  0.8   Alkaline Phos 38 - 126 U/L 148  159  157   AST 15 - 41 U/L 13  17  19    ALT 0 - 44 U/L 17  21  22         Micro Results Recent Results (from the past 240 hours)  Culture, blood (routine x 2) Call MD if unable to obtain prior to antibiotics being given     Status: None (Preliminary result)   Collection Time: 10/25/23  2:00 AM   Specimen: BLOOD RIGHT HAND  Result Value Ref Range Status   Specimen Description BLOOD RIGHT HAND  Final   Special Requests   Final    BOTTLES DRAWN AEROBIC AND ANAEROBIC Blood Culture adequate volume   Culture   Final    NO GROWTH 4 DAYS Performed at [facility], [address] [place], [place] [address]    Report Status PENDING  Incomplete  Culture, blood (routine x 2) Call MD if unable to obtain prior to antibiotics being given     Status: None (Preliminary result)   Collection Time: 10/25/23  2:06 AM   Specimen: BLOOD LEFT HAND  Result Value Ref Range Status   Specimen Description BLOOD LEFT HAND  Final   Special Requests   Final    BOTTLES DRAWN AEROBIC AND ANAEROBIC Blood Culture adequate volume   Culture   Final    NO GROWTH 4 DAYS Performed at [facility], [address] [place], [place] [address]    Report Status PENDING  Incomplete  MRSA Next Gen by PCR, Nasal     Status: None   Collection Time: 10/25/23  4:11 PM   Specimen: Nasal Mucosa; Nasal Swab  Result Value Ref Range Status   MRSA by PCR Next Gen NOT DETECTED NOT DETECTED Final    Comment: (NOTE) The GeneXpert MRSA Assay (FDA approved for NASAL  specimens only), is one component of a comprehensive MRSA colonization surveillance program. It is not intended to diagnose MRSA infection nor to guide or monitor treatment for MRSA infections. Test performance is not FDA approved in patients less than 27 years old. Performed at [facility], [address], [place], [place] [address]   Respiratory (~20 pathogens) panel by PCR     Status: None   Collection Time: 10/26/23  1:41 AM   Specimen: Nasopharyngeal Swab; Respiratory  Result Value Ref Range Status   Adenovirus NOT DETECTED NOT DETECTED Final   Coronavirus 229E NOT DETECTED NOT DETECTED Final    Comment: (NOTE) The Coronavirus on the Respiratory Panel, DOES NOT test for the novel  Coronavirus (2019 nCoV)    Coronavirus HKU1 NOT DETECTED NOT DETECTED Final   Coronavirus NL63 NOT DETECTED  NOT DETECTED Final   Coronavirus OC43 NOT DETECTED NOT DETECTED Final   Metapneumovirus NOT DETECTED NOT DETECTED Final   Rhinovirus / Enterovirus NOT DETECTED NOT DETECTED Final   Influenza A NOT DETECTED NOT DETECTED Final   Influenza B NOT DETECTED NOT DETECTED Final   Parainfluenza Virus 1 NOT DETECTED NOT DETECTED Final   Parainfluenza Virus 2 NOT DETECTED NOT DETECTED Final   Parainfluenza Virus 3 NOT DETECTED NOT DETECTED Final   Parainfluenza Virus 4 NOT DETECTED NOT DETECTED Final   Respiratory Syncytial Virus NOT DETECTED NOT DETECTED Final   Bordetella pertussis NOT DETECTED NOT DETECTED Final   Bordetella Parapertussis NOT DETECTED NOT DETECTED Final   Chlamydophila pneumoniae NOT DETECTED NOT DETECTED Final   Mycoplasma pneumoniae NOT DETECTED NOT DETECTED Final    Comment: Performed at [facility], [address] [place], [place] [address]    Radiology Reports CT Angio Chest Pulmonary Embolism (PE) W or WO Contrast Result Date: 10/28/2023 CLINICAL DATA:  Worsening hypoxia. Active malignancy. Non-small cell lung cancer. EXAM: CT ANGIOGRAPHY CHEST WITH CONTRAST TECHNIQUE:  Multidetector CT imaging of the chest was performed using the standard protocol during bolus administration of intravenous contrast. Multiplanar CT image reconstructions and MIPs were obtained to evaluate the vascular anatomy. RADIATION DOSE REDUCTION: This exam was performed according to the departmental dose-optimization program which includes automated exposure control, adjustment of the mA and/or kV according to patient size and/or use of iterative reconstruction technique. CONTRAST:  75mL OMNIPAQUE  IOHEXOL  350 MG/ML SOLN COMPARISON:  CT of the chest 08/04/2023. FINDINGS: Cardiovascular: Satisfactory opacification of the pulmonary arteries to the segmental level. No evidence of pulmonary embolism. Normal heart size. No pericardial effusion. There are atherosclerotic calcifications of the aorta. Right chest port catheter tip ends in the distal SVC. Mediastinum/Nodes: No enlarged mediastinal, hilar, or axillary lymph nodes. Thyroid  gland, trachea, and esophagus demonstrate no significant findings. Lungs/Pleura: Severe emphysema present. Spiculated nodular density in the right lung apex measures 1.4 x 1.2 cm and appears unchanged. There is multifocal new airspace consolidation in the bilateral lower lobes. Focal opacity in the central right middle lobe/perihilar region appears unchanged from prior. There are small bilateral pleural effusions, increased from prior. No pneumothorax. There is a new left apical nodule measuring 6 mm image 6/12. Upper Abdomen: No acute abnormality. Musculoskeletal: Degenerative changes affect the spine. The bones are diffusely osteopenic. Lower vertebral body hemangioma present. Review of the MIP images confirms the above findings. IMPRESSION: 1. No evidence for pulmonary embolism. 2. New multifocal airspace consolidation in the bilateral lower lobes worrisome for pneumonia. 3. New 6 mm left apical nodule. 4. Stable spiculated nodular density in the right lung apex. 5. Stable focal  opacity in the central right middle lobe/perihilar region. 6. Small bilateral pleural effusions, increased from prior. Aortic Atherosclerosis (ICD10-I70.0) and Emphysema (ICD10-J43.9). Electronically Signed   By: Greig Pique M.D.   On: 10/28/2023 15:57   US  Venous Img Lower Bilateral (DVT) Result Date: 10/28/2023 CLINICAL DATA:  Bilateral lower extremity edema EXAM: BILATERAL LOWER EXTREMITY VENOUS DOPPLER ULTRASOUND TECHNIQUE: Gray-scale sonography with compression, as well as color and duplex ultrasound, were performed to evaluate the deep venous system(s) from the level of the common femoral vein through the popliteal and proximal calf veins. COMPARISON:  None Available. FINDINGS: VENOUS Normal compressibility of the common femoral, superficial femoral, and popliteal veins, as well as the visualized calf veins. Visualized portions of profunda femoral vein and great saphenous vein unremarkable. No filling defects to suggest  DVT on grayscale or color Doppler imaging. Doppler waveforms show normal direction of venous flow, normal respiratory plasticity and response to augmentation. Limited views of the contralateral common femoral vein are unremarkable. OTHER None. Limitations: none IMPRESSION: No DVT identified in the bilateral lower extremity veins. Electronically Signed   By: Michaeline Blanch M.D.   On: 10/28/2023 14:14     SIGNED: Adriana DELENA Grams, MD, FHM. FAAFP. [facility] - [facility] Critical care time spent - 55 min.  In seeing, evaluating and examining the patient. Reviewing medical records, labs, drawn plan of care. Managing acute on chronic respiratory failure-   [facility],  Pager (please use [email or web address] to page/ text) Please use Epic Secure Chat for non-urgent communication (7AM-7PM)  If 7PM-7AM, please contact night-coverage [email or web address], 10/29/2023, 10:30 AM

## 2023-10-29 NOTE — Plan of Care (Signed)
  Problem: Acute Rehab PT Goals(only PT should resolve) Goal: Pt Will Go Supine/Side To Sit Outcome: Progressing Flowsheets (Taken 10/29/2023 1215) Pt will go Supine/Side to Sit: with modified independence Goal: Patient Will Transfer Sit To/From Stand Outcome: Progressing Flowsheets (Taken 10/29/2023 1215) Patient will transfer sit to/from stand: with modified independence Goal: Pt Will Transfer Bed To Chair/Chair To Bed Outcome: Progressing Flowsheets (Taken 10/29/2023 1215) Pt will Transfer Bed to Chair/Chair to Bed: with modified independence Goal: Pt Will Ambulate Outcome: Progressing Flowsheets (Taken 10/29/2023 1215) Pt will Ambulate:  > 125 feet  with modified independence  with rolling walker   12:16 PM, 10/29/23 Lynwood Music, MPT Physical Therapist with [facility] 336 [phone] office [phone] mobile phone

## 2023-10-29 NOTE — Progress Notes (Signed)
 NAMEAnely Zuniga, MRN:  [id number], DOB:  1951-01-31, LOS: 5 ADMISSION DATE:  10/24/2023, CONSULTATION DATE:  10/29/23 REFERRING MD:  TRH, CHIEF COMPLAINT:  tachypnea   History of Present Illness:  73 year old woman history of chronic hypercarbic and hypoxemic respiratory failure, 4 L baseline, COPD, most recent spirometry 2017 moderate severity likely worsened over time, presented to hospital with concern for pneumonia.  History obtained via chart.  Unable to communicate reliably via camera and with BiPAP mask.  Came to the hospital respirator stress.  Left lower lobe minimal infiltrates noted on chest x-ray.  Started on antibiotics and steroids.  No real improvement.  Started be aggressively diuresed.  No real improvement.  Now requiring BiPAP for work of breathing.  Hypoxemia not that much worse.  Repeat chest x-ray on my review interpretation shows improvement in left sided infiltrates that were there previously on admission.  She is tachycardic.  She has had serial blood gases that reveal well compensated respiratory acidosis and hypercarbia.  Worsening metabolic alkalosis with diuresis and hyperchloremia.  Per discussion with hospital she appears dry on exam.  She has a history of active malignancy although stable.  With worsening respiratory status with relatively stable chest x-ray as well as concomitant tachycardia, high concern for PE/VTE.  Pertinent  Medical History  Emphysema, chronic respiratory failure 4 L nasal cannula, lung cancer currently being observed with no recent recurrence of disease  Significant Hospital Events: Including procedures, antibiotic start and stop dates in addition to other pertinent events   10/24/2023 admitted to the hospital with acute on chronic respite failure felt to be related to pneumonia 7/16 PCCM consult with worsening clinical trajectory despite aggressive diuresis, antibiotics, oral steroids  Interim History / Subjective:  Now on baseline  [place], remains tachypneic, no PE or DVT, zosyn  started yday  Objective    Blood pressure 133/73, pulse (!) 108, temperature 98.3 F (36.8 C), temperature source Oral, resp. rate (!) 39, height 5' 6 (1.676 m), weight 57.2 kg, SpO2 99%.        Intake/Output Summary (Last 24 hours) at 10/29/2023 1910 Last data filed at 10/29/2023 1213 Gross per 24 hour  Intake 240 ml  Output --  Net 240 ml   Filed Weights   10/25/23 1727  Weight: 57.2 kg    Examination: Deferred  Labs, images, flowsheets, VS reviewed   Resolved problem list   Assessment and Plan   Acute on chronic hypoxemic and hypercarbic respiratory failure: Unclear etiology.  Initial chest x-ray with possible left-sided infiltrates.  Treated for pneumonia is reasonable.  Unfortunately antibiotics, and steroids for presumed COPD exacerbation, there is been no significant improvement.  In fact it seems clinically she is worsening.  Hypoxemia relatively stable but increased work of breathing.  Diuresis was started and again no improvement, if anything worsening. DVT and PE ruled out -- Continue steroids, IV Solu-Medrol  through 7/17, consider transition to prednisone  40 mg daily x 7 days, then  20 mg daily x 7 days, then 10 mg daily for 7 days then stop -- Nebulized LAMA LABA ICS, as needed short acting bronchodilators -- Zosyn  started 7/16, recommend 5-7 days given response to therapy and time to improve --DNR is appropriate - recommend ongoing GOC  PCCM will sign off  Best Practice (right click and Reselect all SmartList Selections daily)   Per primary Labs   CBC: Recent Labs  Lab 10/24/23 2038 10/25/23 0551 10/25/23 0908 10/26/23 0335 10/27/23 0424 10/28/23 0414 10/29/23 0436  WBC  7.8 7.7  --  9.8 10.9* 9.9 8.6  NEUTROABS 6.5  --   --   --   --   --   --   HGB 8.0* 7.4* 7.3* 9.3* 9.9* 9.7* 9.6*  HCT 26.9* 25.3* 25.3* 31.7* 34.1* 33.2* 33.9*  MCV 78.0* 77.8*  --  80.5 83.4 82.8 82.9  PLT 408* 376  --  356 412*  440* 377    Basic Metabolic Panel: Recent Labs  Lab 10/24/23 2038 10/25/23 0551 10/26/23 0335 10/27/23 0424 10/28/23 0414  NA 138 136 140 141 142  K 4.1 4.1 4.5 5.4* 4.2  CL 94* 93* 96* 93* 86*  CO2 32 32 36* 39* 44*  GLUCOSE 107* 111* 140* 139* 123*  BUN 12 20 15 23 18   CREATININE 0.51 0.38* 0.43* 0.51 0.33*  CALCIUM  8.8* 8.4* 8.5* 9.0 9.0  MG  --  1.7  --   --   --   PHOS  --  3.7  --   --   --    GFR: Estimated Creatinine Clearance: 55.7 mL/min (A) (by C-G formula based on SCr of 0.33 mg/dL (L)). Recent Labs  Lab 10/25/23 0551 10/26/23 0335 10/27/23 0424 10/28/23 0414 10/28/23 1427 10/29/23 0436  PROCALCITON <0.10  --   --   --  <0.10  --   WBC 7.7 9.8 10.9* 9.9  --  8.6  LATICACIDVEN  --   --   --   --  0.8  --     Liver Function Tests: Recent Labs  Lab 10/24/23 2038 10/25/23 0551 10/26/23 0335 10/27/23 0424 10/28/23 0414  AST 19 20 19 17  13*  ALT 19 20 22 21 17   ALKPHOS 187* 168* 157* 159* 148*  BILITOT 0.2 0.5 0.8 0.3 0.4  PROT 6.5 5.9* 6.0* 6.7 6.2*  ALBUMIN  3.3* 3.0* 3.0* 3.2* 3.2*   No results for input(s): LIPASE, AMYLASE in the last 168 hours. No results for input(s): AMMONIA in the last 168 hours.  ABG    Component Value Date/Time   PHART 7.44 10/29/2023 0446   PCO2ART 77 (HH) 10/29/2023 0446   PO2ART 73 (L) 10/29/2023 0446   HCO3 52.3 (H) 10/29/2023 0446   TCO2 23 10/19/2010 1852   O2SAT 96.2 10/29/2023 0446     Coagulation Profile: No results for input(s): INR, PROTIME in the last 168 hours.  Cardiac Enzymes: No results for input(s): CKTOTAL, CKMB, CKMBINDEX, TROPONINI in the last 168 hours.  HbA1C: Hgb A1c MFr Bld  Date/Time Value Ref Range Status  02/16/2014 02:54 PM 5.7 (H) <5.7 % Final    Comment:    (NOTE)                                                                       According to the ADA Clinical Practice Recommendations for 2011, when HbA1c is used as a screening test:  >=6.5%   Diagnostic  of Diabetes Mellitus           (if abnormal result is confirmed) 5.7-6.4%   Increased risk of developing Diabetes Mellitus References:Diagnosis and Classification of Diabetes Mellitus,Diabetes Care,2011,34(Suppl 1):S62-S69 and Standards of Medical Care in         Diabetes - 2011,Diabetes Care,2011,34 (Suppl 1):S11-S61.  CBG: No results for input(s): GLUCAP in the last 168 hours.  Review of Systems:   Unable to obtain due to patient factors, BiPAP, remote camera and difficulty communicating  Past Medical History:  She,  has a past medical history of Anemia, Arthritis, Asthma, Brain tumor (benign) (HCC) (2005 [facility]), Chronic headaches, Chronic hip pain, Chronic pain, COPD (chronic obstructive pulmonary disease) (HCC), Coronary artery disease, Depression, Depression (05/15/2016), Encounter for antineoplastic chemotherapy (01/10/2016), GERD (gastroesophageal reflux disease), Hypertension, Lung cancer (HCC) (dx'd 01/2016), NSTEMI (non-ST elevated myocardial infarction) (HCC) (yrs ago), On home O2, Pneumonia (last time 2 yrs ago), and Shortness of breath dyspnea.   Surgical History:   Past Surgical History:  Procedure Laterality Date   CHOLECYSTECTOMY     COLONOSCOPY  2015   Results requested from [facility]   COLONOSCOPY     ESOPHAGOGASTRODUODENOSCOPY N/A 08/14/2015   Procedure: ESOPHAGOGASTRODUODENOSCOPY (EGD);  Surgeon: Lamar CHRISTELLA Hollingshead, MD;  Location: [facility];  Service: Endoscopy;  Laterality: N/A;  215    ESOPHAGOGASTRODUODENOSCOPY (EGD) WITH PROPOFOL  N/A 09/13/2015   Procedure: ESOPHAGOGASTRODUODENOSCOPY (EGD) WITH PROPOFOL ;  Surgeon: Toribio SHAUNNA Cedar, MD;  Location: [facility];  Service: Endoscopy;  Laterality: N/A;   ESOPHAGOGASTRODUODENOSCOPY (EGD) WITH PROPOFOL  N/A 05/19/2022   Procedure: ESOPHAGOGASTRODUODENOSCOPY (EGD) WITH PROPOFOL ;  Surgeon: Cindie Carlin POUR, DO;  [facility]: [facility];  Service: Endoscopy;  Laterality: N/A;   EUS N/A 03/12/2017   Procedure:  UPPER ENDOSCOPIC ULTRASOUND (EUS) RADIAL;  Surgeon: Cedar Toribio SHAUNNA, MD;  Location: [facility];  Service: Endoscopy;  Laterality: N/A;   HOT HEMOSTASIS  05/19/2022   Procedure: HOT HEMOSTASIS (ARGON PLASMA COAGULATION/BICAP);  Surgeon: Cindie Carlin POUR, DO;  Location: [facility];  Service: Endoscopy;;   IR IMAGING GUIDED PORT INSERTION  01/20/2022   TUMOR REMOVAL  2005   Benign   UPPER ESOPHAGEAL ENDOSCOPIC ULTRASOUND (EUS)  09/13/2015   Procedure: UPPER ESOPHAGEAL ENDOSCOPIC ULTRASOUND (EUS);  Surgeon: Toribio SHAUNNA Cedar, MD;  Location: [facility];  Service: Endoscopy;;   VIDEO BRONCHOSCOPY WITH ENDOBRONCHIAL NAVIGATION N/A 12/31/2015   Procedure: VIDEO BRONCHOSCOPY WITH ENDOBRONCHIAL NAVIGATION;  Surgeon: Elspeth JAYSON Millers, MD;  Location: [facility] OR;  Service: Thoracic;  Laterality: N/A;   VIDEO BRONCHOSCOPY WITH ENDOBRONCHIAL ULTRASOUND N/A 11/08/2015   Procedure: VIDEO BRONCHOSCOPY WITH ENDOBRONCHIAL ULTRASOUND;  Surgeon: Maude Fleeta Ochoa, MD;  Location: [facility] OR;  Service: Thoracic;  Laterality: N/A;     Social History:   reports that she quit smoking about 5 years ago. Her smoking use included cigarettes. She started smoking about 43 years ago. She has a 38 pack-year smoking history. She has never used smokeless tobacco. She reports that she does not drink alcohol and does not use drugs.   Family History:  Her family history includes Brain cancer in her sister; Lung cancer in her brother, brother, and father; Ovarian cancer in her mother; Prostate cancer in her brother. There is no history of Colon cancer or Colon polyps.   Allergies Allergies  Allergen Reactions   Desyrel  [Trazodone ] Other (See Comments)    Made me faint     Home Medications  Prior to Admission medications   Medication Sig Start Date End Date Taking? Authorizing Provider  Aspirin -Salicylamide-Caffeine  (BC HEADACHE POWDER PO) Take 1 packet by mouth as needed (headache).   Yes [provider]  benzonatate  (TESSALON) 100 MG capsule Take 100 mg by mouth 3 (three) times daily as needed for cough. 05/22/23  Yes [provider]  bisoprolol  (ZEBETA ) 5 MG tablet  Take 0.5 tablets (2.5 mg total) by mouth daily. 09/22/23  Yes Johnson, Clanford L, MD  docusate sodium  (COLACE) 100 MG capsule Take 1 capsule (100 mg total) by mouth 2 (two) times daily. 08/12/23 08/11/24 Yes Shah, Pratik D, DO  ENSURE (ENSURE) Take 237 mLs by mouth 3 (three) times daily between meals.   Yes [provider]  Fluticasone -Umeclidin-Vilant (TRELEGY ELLIPTA ) 100-62.5-25 MCG/ACT AEPB INHALE 1 PUFF BY MOUTH EVERY DAY   Yes [provider]  furosemide  (LASIX ) 20 MG tablet Take 1 tablet (20 mg total) by mouth every Monday, Wednesday, and Friday. 09/23/23  Yes Johnson, Clanford L, MD  Ipratropium-Albuterol  (COMBIVENT  RESPIMAT) 20-100 MCG/ACT AERS respimat Inhale 1 puff into the lungs every 6 (six) hours as needed for wheezing or shortness of breath. 08/28/22  Yes Emokpae, Courage, MD  megestrol  (MEGACE ) 40 MG/ML suspension Take 400 mg by mouth 3 (three) times daily. 10/01/23  Yes [provider]  Polyethylene Glycol 3350  (PEG 3350 ) 17 GM/SCOOP POWD Take 17 g by mouth daily. 10/21/23  Yes [provider]  traZODone  (DESYREL ) 50 MG tablet Take 1 tablet (50 mg total) by mouth at bedtime. 08/28/22  Yes Pearlean Manus, MD  ferrous sulfate  325 (65 FE) MG tablet Take 1 tablet (325 mg total) by mouth daily with breakfast. 08/13/23 09/18/23  Maree, Pratik D, DO  HYDROcodone  bit-homatropine (HYCODAN) 5-1.5 MG/5ML syrup Take 5 mLs by mouth every 6 (six) hours as needed for cough. 11/10/21   Sherrod Sherrod, MD     Critical care time:     CRITICAL CARE Performed by: Donnice JONELLE Beals   Total critical care time: 35 minutes  Critical care time was exclusive of separately billable procedures and treating other patients.  Critical care was necessary to treat or prevent imminent or life-threatening  deterioration.  Critical care was time spent personally by me on the following activities: development of treatment plan with patient and/or surrogate as well as nursing, discussions with consultants, evaluation of patient's response to treatment, examination of patient, obtaining history from patient or surrogate, ordering and performing treatments and interventions, ordering and review of laboratory studies, ordering and review of radiographic studies, pulse oximetry and re-evaluation of patient's condition.  Donnice JONELLE Beals, MD See TRACEY

## 2023-10-29 NOTE — Evaluation (Signed)
 Physical Therapy Evaluation Patient Details Name: Courtney Zuniga MRN: [id number] DOB: 07/11/1950 Today's Date: 10/29/2023  History of Present Illness  Courtney Zuniga is a 73 y.o. female with medical history significant of COPD, asthma, chronic respiratory failure with hypoxia on supplemental oxygen  via [place] at 4 LPM, coronary artery disease, depression and lung cancer who presents to the emergency department due to several days onset of left-sided chest pain with progressive shortness of breath and several weeks of nonproductive cough.  She was noted to have black stool and her home health nurse states that this may be due to blood in her stool.  Patient endorsed taking Pepto-Bismol recently.   Clinical Impression  Patient functioning near baseline for functional mobility and gait demonstrating fair/good return for transfers and ambulating in room/hallway without loss of balance while on 4 LPM with SpO2 at 94% and tolerated sitting up in chair after therapy. Patient will benefit from continued skilled physical therapy in hospital and recommended venue below to increase strength, balance, endurance for safe ADLs and gait.          If plan is discharge home, recommend the following: A little help with walking and/or transfers;A little help with bathing/dressing/bathroom;Help with stairs or ramp for entrance;Assistance with cooking/housework   Can travel by private vehicle        Equipment Recommendations None recommended by PT  Recommendations for Other Services       Functional Status Assessment Patient has had a recent decline in their functional status and demonstrates the ability to make significant improvements in function in a reasonable and predictable amount of time.     Precautions / Restrictions Precautions Precautions: Fall Recall of Precautions/Restrictions: Intact Restrictions Weight Bearing Restrictions Per Provider Order: No      Mobility  Bed Mobility Overal bed  mobility: Needs Assistance Bed Mobility: Supine to Sit     Supine to sit: Supervision     General bed mobility comments: slightly labored movement    Transfers Overall transfer level: Needs assistance Equipment used: Rolling walker (2 wheels) Transfers: Sit to/from Stand, Bed to chair/wheelchair/BSC Sit to Stand: Supervision, Contact guard assist   Step pivot transfers: Contact guard assist, Supervision       General transfer comment: increased time with fair/good return for transferring to/from commode    Ambulation/Gait Ambulation/Gait assistance: Supervision Gait Distance (Feet): 100 Feet Assistive device: Rolling walker (2 wheels) Gait Pattern/deviations: Decreased step length - right, Decreased step length - left, Decreased stride length Gait velocity: decreased     General Gait Details: slightly labored movement with good return for ambulation in room, hallways without loss of balance while on 4 LPM O2 with SpO2 at 94%  Stairs            Wheelchair Mobility     Tilt Bed    Modified Rankin (Stroke Patients Only)       Balance Overall balance assessment: Needs assistance Sitting-balance support: Feet supported, No upper extremity supported Sitting balance-Leahy Scale: Fair Sitting balance - Comments: fair/good seated at EOB   Standing balance support: Reliant on assistive device for balance, During functional activity, Bilateral upper extremity supported Standing balance-Leahy Scale: Fair Standing balance comment: fair/good using RW                             Pertinent Vitals/Pain Pain Assessment Pain Assessment: No/denies pain    Home Living Family/patient expects to be discharged to:: Private residence Living Arrangements:  Children Available Help at Discharge: Family Type of Home: House Home Access: Stairs to enter Entrance Stairs-Rails: Left Entrance Stairs-Number of Steps: 3   Home Layout: One level Home Equipment:  Agricultural consultant (2 wheels);Shower seat      Prior Function Prior Level of Function : Needs assist       Physical Assist : Mobility (physical);ADLs (physical) Mobility (physical): Bed mobility;Transfers;Gait;Stairs   Mobility Comments: Household and short distanced community ambulator without use of AD, on 4 LPM O2 at home ADLs Comments: Independent ADL; assist IADL     Extremity/Trunk Assessment   Upper Extremity Assessment Upper Extremity Assessment: Overall WFL for tasks assessed    Lower Extremity Assessment Lower Extremity Assessment: Generalized weakness    Cervical / Trunk Assessment Cervical / Trunk Assessment: Normal  Communication   Communication Communication: No apparent difficulties    Cognition Arousal: Alert Behavior During Therapy: WFL for tasks assessed/performed   PT - Cognitive impairments: No apparent impairments                         Following commands: Intact       Cueing Cueing Techniques: Verbal cues, Tactile cues     General Comments      Exercises     Assessment/Plan    PT Assessment Patient needs continued PT services  PT Problem List Decreased strength;Decreased activity tolerance;Decreased balance;Decreased mobility       PT Treatment Interventions DME instruction;Gait training;Stair training;Functional mobility training;Therapeutic activities;Therapeutic exercise;Balance training;Patient/family education    PT Goals (Current goals can be found in the Care Plan section)  Acute Rehab PT Goals Patient Stated Goal: return home aides to assist PT Goal Formulation: With patient Time For Goal Achievement: 11/02/23 Potential to Achieve Goals: Good    Frequency Min 2X/week     Co-evaluation               AM-PAC PT 6 Clicks Mobility  Outcome Measure Help needed turning from your back to your side while in a flat bed without using bedrails?: None Help needed moving from lying on your back to sitting on the  side of a flat bed without using bedrails?: A Little Help needed moving to and from a bed to a chair (including a wheelchair)?: A Little Help needed standing up from a chair using your arms (e.g., wheelchair or bedside chair)?: A Little Help needed to walk in hospital room?: A Little Help needed climbing 3-5 steps with a railing? : A Little 6 Click Score: 19    End of Session Equipment Utilized During Treatment: Oxygen  Activity Tolerance: Patient tolerated treatment well;Patient limited by fatigue Patient left: in chair;with call bell/phone within reach Nurse Communication: Mobility status PT Visit Diagnosis: Unsteadiness on feet (R26.81);Other abnormalities of gait and mobility (R26.89);Muscle weakness (generalized) (M62.81)    Time: 9159-9086 PT Time Calculation (min) (ACUTE ONLY): 33 min   Charges:   PT Evaluation $PT Eval Moderate Complexity: 1 Mod PT Treatments $Therapeutic Activity: 23-37 mins PT General Charges $$ ACUTE PT VISIT: 1 Visit         12:13 PM, 10/29/23 Lynwood Music, MPT Physical Therapist with [facility] 336 [phone] office [phone] mobile phone

## 2023-10-29 NOTE — Progress Notes (Signed)
   10/29/23 0915  Spiritual Encounters  Type of Visit Initial  Care provided to: Patient  Referral source Chaplain team  Reason for visit Routine spiritual support  OnCall Visit No   Chaplain met with the patient, Courtney Zuniga, who was alert and welcomed my into her space. We talked about the events that brought her into the hospital and reflected on Ethan's confidence in our Lord. She was happy to share some of her life and the wisdom that is part of her story. I wished her peace through her day and I departed.   Carley Birmingham Chaplaiin  [facility] [phone]

## 2023-10-29 NOTE — Progress Notes (Signed)
 Palliative: Chart review completed.  Overall Courtney Zuniga is improving.  She is connected with [facility] for outpatient palliative services and is not expected to discharge home in the next few days.  No needs identified at this time.  No charge Palliative medicine team Team phone [phone]

## 2023-10-30 DIAGNOSIS — J189 Pneumonia, unspecified organism: Secondary | ICD-10-CM | POA: Diagnosis not present

## 2023-10-30 DIAGNOSIS — J441 Chronic obstructive pulmonary disease with (acute) exacerbation: Secondary | ICD-10-CM | POA: Diagnosis not present

## 2023-10-30 DIAGNOSIS — E8809 Other disorders of plasma-protein metabolism, not elsewhere classified: Secondary | ICD-10-CM | POA: Diagnosis not present

## 2023-10-30 DIAGNOSIS — D509 Iron deficiency anemia, unspecified: Secondary | ICD-10-CM | POA: Diagnosis not present

## 2023-10-30 LAB — CBC
HCT: 35.2 % — ABNORMAL LOW (ref 36.0–46.0)
Hemoglobin: 10.5 g/dL — ABNORMAL LOW (ref 12.0–15.0)
MCH: 24.5 pg — ABNORMAL LOW (ref 26.0–34.0)
MCHC: 29.8 g/dL — ABNORMAL LOW (ref 30.0–36.0)
MCV: 82.2 fL (ref 80.0–100.0)
Platelets: 400 K/uL (ref 150–400)
RBC: 4.28 MIL/uL (ref 3.87–5.11)
RDW: 22 % — ABNORMAL HIGH (ref 11.5–15.5)
WBC: 8.6 K/uL (ref 4.0–10.5)
nRBC: 0.2 % (ref 0.0–0.2)

## 2023-10-30 LAB — CULTURE, BLOOD (ROUTINE X 2)
Culture: NO GROWTH
Culture: NO GROWTH
Special Requests: ADEQUATE
Special Requests: ADEQUATE

## 2023-10-30 MED ORDER — PREDNISONE 10 MG PO TABS: ORAL_TABLET | ORAL | 0 refills | Status: DC

## 2023-10-30 MED ORDER — BISOPROLOL FUMARATE 5 MG PO TABS
2.5000 mg | ORAL_TABLET | Freq: Every day | ORAL | Status: DC
  Administered 2023-10-30: 2.5 mg via ORAL
  Filled 2023-10-30: qty 1

## 2023-10-30 MED ORDER — PREDNISONE 20 MG PO TABS: 40.0000 mg | ORAL_TABLET | Freq: Every day | ORAL | Status: DC

## 2023-10-30 MED ORDER — HEPARIN SOD (PORK) LOCK FLUSH 100 UNIT/ML IV SOLN
500.0000 [IU] | Freq: Once | INTRAVENOUS | Status: AC
  Administered 2023-10-30: 500 [IU] via INTRAVENOUS
  Filled 2023-10-30: qty 5

## 2023-10-30 MED ORDER — FERROUS SULFATE 325 (65 FE) MG PO TABS: 325.0000 mg | ORAL_TABLET | Freq: Every day | ORAL | Status: DC

## 2023-10-30 NOTE — Discharge Summary (Signed)
 Physician Discharge Summary  Courtney Zuniga FMW:[id number] DOB: February 26, 1951 DOA: 10/24/2023  PCP: Myra Geni ORN, FNP  Admit date: 10/24/2023 Discharge date: 10/30/2023  Disposition: Home with outpatient Hospice  Recommendations for Outpatient Follow-up:  Follow up with PCP in 1 weeks  Home Health: outpatient hospice   Discharge Condition: STABLE   CODE STATUS: DNR  DNI  DIET: regular    Brief Hospitalization Summary: Please see all hospital notes, images, labs for full details of the hospitalization. Admission provider HPI:   73 y.o. female with medical history significant of COPD, asthma, chronic respiratory failure with hypoxia on supplemental oxygen  via [place] at 4 LPM, coronary artery disease, depression and lung cancer who presents to the emergency department due to several days onset of left-sided chest pain with progressive shortness of breath and several weeks of nonproductive cough.  She was noted to have black stool and her home health nurse states that this may be due to blood in her stool.  Patient endorsed taking Pepto-Bismol recently.   ED Course: - Tachypneic with respiratory rate of 35/min, tachycardic with HR of 103 bpm, O2 sat 97% on 4 LPM,  WBC 7.8, hemoglobin 8.0, hematocrit 26.9, MCV 78.0, platelets 408.  BMP was normal except for chloride of 94 and blood glucose of 107, albumin  3.3, ALP 187.   troponin x 2 - 12 > 11.  FOBT positive, Chest x-ray showed new airspace opacities in the left lower lobe, suspicious for pneumonia. Stable focal opacity in the right mid lung and linear atelectasis or scarring in the right lung base.  Patient was treated with IV ceftriaxone  and azithromycin , breathing treatment with DuoNeb was provided.   HOSPITAL COURSE BY LISTED PROBLEMS   Acute on chronic respiratory failure due to COPD exacerbation/pneumonia - Remaining respiratory distress, unable to tolerate BiPAP overnight -Back on 4 L of oxygen , satting 92% -ABG reviewed, pCO2 83 >>> 77 with  normal pH - pt was treated with DuoNeb bronchodilator treatment, IV steroids, - Discontinued IV Lasix  no signs of volume overload or infection) - Pulmonary critical care consulted, appreciate close follow-up and recommendations - Antibiotics switched from azithromycin  Rocephin  to Zosyn  on 10/28/2023 -Continue supplemental oxygen , maintaining O2 sat 88-92%  -Continue mucolytics -7/18: pt reports she is breathing back at baseline, DC home today with outpatient hospice -DC on steroid taper, antibiotics, resume home oxygen  4L/min and bronchodilators    Community-acquired pneumonia ammonia - TREATED  -Chest x-ray was suspicious for pneumonia -Was on ceftriaxone  azithromycin , --switched to Zosyn  - Cultures-no growth to date -  Procalcitonin <0.10 Continue Mucinex , incentive spirometry, flutter valve    Acute exacerbation of COPD -with acute on chronic respiratory failure -Unable to tolerate BiPAP, on 4 L at baseline satting 92% which is her baseline oxygen  requirement -Chronic Hypercapnia with PCO2 as high as 83, 77 now  Continue Xopenex , Atrovent , Mucinex , Solu-Medrol , IV ABX Zosyn  Continue Protonix  to prevent steroid-induced ulcer Continue incentive spirometry and flutter valve Continue supplemental oxygen  to maintain O2 sat > 94%    Rectal bleeding-no history of anemia of chronic disease-deficiency anemia Hemoccult was positive     Latest Ref Rng & Units 10/27/2023    4:24 AM 10/26/2023    3:35 AM 10/25/2023    9:08 AM  CBC  WBC 4.0 - 10.5 K/uL 10.9  9.8     Hemoglobin 12.0 - 15.0 g/dL 9.9  9.3  7.3   Hematocrit 36.0 - 46.0 % 34.1  31.7  25.3   Platelets 150 - 400 K/uL 412  356       Patient endorsed taking ferrous sulfate  for iron  deficiency anemia Was on IV Protonix  40 mg twice daily>> switching to p.o. Gastroenterologist consulted-recommending outpatient follow-up and workup    Since patient is tachypneic, tachycardic, hypotensive-  10/25/2023 s/p 2U PRBC blood transfusion      Microcytic anemia-with Sever iron  deficiency anemia Iron /TIBC/Ferritin/ %Sat         Component Value Date/Time    IRON  7 (L) 10/25/2023 0551    TIBC 358 10/25/2023 0551    FERRITIN 5 (L) 10/25/2023 0551    IRONPCTSAT 2 (L) 10/25/2023 0551  - S/p IV iron -infusion x 3 - Supplement p.o. iron  on discharge    Hypoalbuminemia possibly secondary to mild protein calorie malnutrition Albumin  3.3, protein supplement will be provided   Essential hypertension Patient was not taking bisoprolol  Continue blood pressure monitoring   Adenocarcinoma of right lung, stage 4 Patient follows up as outpatient with oncology (Dr. Gatha)   Discharge Diagnoses:  Principal Problem:   CAP (community acquired pneumonia) Active Problems:   Essential hypertension   Adenocarcinoma of right lung, stage 4   COPD exacerbation (HCC)   Iron  deficiency anemia   Acute on chronic respiratory failure with hypoxia (HCC)   Rectal bleeding   Microcytic anemia   Hypoalbuminemia due to protein-calorie malnutrition (HCC)   Acute on chronic respiratory failure (HCC)   ABLA (acute blood loss anemia)   Discharge Instructions:  Allergies as of 10/30/2023       Reactions   Desyrel  [trazodone ] Other (See Comments)   Made me faint        Medication List     TAKE these medications    BC HEADACHE POWDER PO Take 1 packet by mouth as needed (headache).   benzonatate 100 MG capsule Commonly known as: TESSALON Take 100 mg by mouth 3 (three) times daily as needed for cough.   bisoprolol  5 MG tablet Commonly known as: ZEBETA  Take 0.5 tablets (2.5 mg total) by mouth daily.   Combivent  Respimat 20-100 MCG/ACT Aers respimat Generic drug: Ipratropium-Albuterol  Inhale 1 puff into the lungs every 6 (six) hours as needed for wheezing or shortness of breath.   docusate sodium  100 MG capsule Commonly known as: Colace Take 1 capsule (100 mg total) by mouth 2 (two) times daily.   Ensure Take 237 mLs by mouth 3  (three) times daily between meals.   ferrous sulfate  325 (65 FE) MG tablet Take 1 tablet (325 mg total) by mouth daily with breakfast.   furosemide  20 MG tablet Commonly known as: LASIX  Take 1 tablet (20 mg total) by mouth every Monday, Wednesday, and Friday.   lactobacillus acidophilus & bulgar chewable tablet Chew 1 tablet by mouth 3 (three) times daily with meals for 10 days.   levofloxacin  750 MG tablet Commonly known as: Levaquin  Take 1 tablet (750 mg total) by mouth daily for 5 days.   megestrol  40 MG/ML suspension Commonly known as: MEGACE  Take 400 mg by mouth 3 (three) times daily.   pantoprazole  40 MG tablet Commonly known as: PROTONIX  Take 1 tablet (40 mg total) by mouth 2 (two) times daily.   PEG 3350  17 GM/SCOOP Powd Take 17 g by mouth daily.   predniSONE  10 MG tablet Commonly known as: DELTASONE  Take 4 PO QAM x 7 days, 2 PO QAM x 7 days, 1 PO QAM x 7 days Start taking on: October 31, 2023   traZODone  50 MG tablet Commonly known as: DESYREL  Take 1 tablet (  50 mg total) by mouth at bedtime.   Trelegy Ellipta  100-62.5-25 MCG/ACT Aepb Generic drug: Fluticasone -Umeclidin-Vilant INHALE 1 PUFF BY MOUTH EVERY DAY        Follow-up Information     Myra Geni ORN, FNP. Schedule an appointment as soon as possible for a visit in 1 week(s).   Specialty: Family Medicine Why: Hospital Follow Up Contact information: [address] [place] [address] [phone]                Allergies  Allergen Reactions   Desyrel  [Trazodone ] Other (See Comments)    Made me faint   Allergies as of 10/30/2023       Reactions   Desyrel  [trazodone ] Other (See Comments)   Made me faint        Medication List     TAKE these medications    BC HEADACHE POWDER PO Take 1 packet by mouth as needed (headache).   benzonatate 100 MG capsule Commonly known as: TESSALON Take 100 mg by mouth 3 (three) times daily as needed for cough.   bisoprolol  5 MG  tablet Commonly known as: ZEBETA  Take 0.5 tablets (2.5 mg total) by mouth daily.   Combivent  Respimat 20-100 MCG/ACT Aers respimat Generic drug: Ipratropium-Albuterol  Inhale 1 puff into the lungs every 6 (six) hours as needed for wheezing or shortness of breath.   docusate sodium  100 MG capsule Commonly known as: Colace Take 1 capsule (100 mg total) by mouth 2 (two) times daily.   Ensure Take 237 mLs by mouth 3 (three) times daily between meals.   ferrous sulfate  325 (65 FE) MG tablet Take 1 tablet (325 mg total) by mouth daily with breakfast.   furosemide  20 MG tablet Commonly known as: LASIX  Take 1 tablet (20 mg total) by mouth every Monday, Wednesday, and Friday.   lactobacillus acidophilus & bulgar chewable tablet Chew 1 tablet by mouth 3 (three) times daily with meals for 10 days.   levofloxacin  750 MG tablet Commonly known as: Levaquin  Take 1 tablet (750 mg total) by mouth daily for 5 days.   megestrol  40 MG/ML suspension Commonly known as: MEGACE  Take 400 mg by mouth 3 (three) times daily.   pantoprazole  40 MG tablet Commonly known as: PROTONIX  Take 1 tablet (40 mg total) by mouth 2 (two) times daily.   PEG 3350  17 GM/SCOOP Powd Take 17 g by mouth daily.   predniSONE  10 MG tablet Commonly known as: DELTASONE  Take 4 PO QAM x 7 days, 2 PO QAM x 7 days, 1 PO QAM x 7 days Start taking on: October 31, 2023   traZODone  50 MG tablet Commonly known as: DESYREL  Take 1 tablet (50 mg total) by mouth at bedtime.   Trelegy Ellipta  100-62.5-25 MCG/ACT Aepb Generic drug: Fluticasone -Umeclidin-Vilant INHALE 1 PUFF BY MOUTH EVERY DAY        Procedures/Studies: CT Angio Chest Pulmonary Embolism (PE) W or WO Contrast Result Date: 10/28/2023 CLINICAL DATA:  Worsening hypoxia. Active malignancy. Non-small cell lung cancer. EXAM: CT ANGIOGRAPHY CHEST WITH CONTRAST TECHNIQUE: Multidetector CT imaging of the chest was performed using the standard protocol during bolus  administration of intravenous contrast. Multiplanar CT image reconstructions and MIPs were obtained to evaluate the vascular anatomy. RADIATION DOSE REDUCTION: This exam was performed according to the departmental dose-optimization program which includes automated exposure control, adjustment of the mA and/or kV according to patient size and/or use of iterative reconstruction technique. CONTRAST:  75mL OMNIPAQUE  IOHEXOL  350 MG/ML SOLN COMPARISON:  CT of the  chest 08/04/2023. FINDINGS: Cardiovascular: Satisfactory opacification of the pulmonary arteries to the segmental level. No evidence of pulmonary embolism. Normal heart size. No pericardial effusion. There are atherosclerotic calcifications of the aorta. Right chest port catheter tip ends in the distal SVC. Mediastinum/Nodes: No enlarged mediastinal, hilar, or axillary lymph nodes. Thyroid  gland, trachea, and esophagus demonstrate no significant findings. Lungs/Pleura: Severe emphysema present. Spiculated nodular density in the right lung apex measures 1.4 x 1.2 cm and appears unchanged. There is multifocal new airspace consolidation in the bilateral lower lobes. Focal opacity in the central right middle lobe/perihilar region appears unchanged from prior. There are small bilateral pleural effusions, increased from prior. No pneumothorax. There is a new left apical nodule measuring 6 mm image 6/12. Upper Abdomen: No acute abnormality. Musculoskeletal: Degenerative changes affect the spine. The bones are diffusely osteopenic. Lower vertebral body hemangioma present. Review of the MIP images confirms the above findings. IMPRESSION: 1. No evidence for pulmonary embolism. 2. New multifocal airspace consolidation in the bilateral lower lobes worrisome for pneumonia. 3. New 6 mm left apical nodule. 4. Stable spiculated nodular density in the right lung apex. 5. Stable focal opacity in the central right middle lobe/perihilar region. 6. Small bilateral pleural effusions,  increased from prior. Aortic Atherosclerosis (ICD10-I70.0) and Emphysema (ICD10-J43.9). Electronically Signed   By: Greig Pique M.D.   On: 10/28/2023 15:57   US  Venous Img Lower Bilateral (DVT) Result Date: 10/28/2023 CLINICAL DATA:  Bilateral lower extremity edema EXAM: BILATERAL LOWER EXTREMITY VENOUS DOPPLER ULTRASOUND TECHNIQUE: Gray-scale sonography with compression, as well as color and duplex ultrasound, were performed to evaluate the deep venous system(s) from the level of the common femoral vein through the popliteal and proximal calf veins. COMPARISON:  None Available. FINDINGS: VENOUS Normal compressibility of the common femoral, superficial femoral, and popliteal veins, as well as the visualized calf veins. Visualized portions of profunda femoral vein and great saphenous vein unremarkable. No filling defects to suggest DVT on grayscale or color Doppler imaging. Doppler waveforms show normal direction of venous flow, normal respiratory plasticity and response to augmentation. Limited views of the contralateral common femoral vein are unremarkable. OTHER None. Limitations: none IMPRESSION: No DVT identified in the bilateral lower extremity veins. Electronically Signed   By: Michaeline Blanch M.D.   On: 10/28/2023 14:14   DG CHEST PORT 1 VIEW Result Date: 10/26/2023 CLINICAL DATA:  Shortness of breath. EXAM: PORTABLE CHEST 1 VIEW COMPARISON:  Same day. FINDINGS: Stable cardiomediastinal silhouette. Right internal jugular Port-A-Cath is unchanged. Stable interstitial densities are noted. Stable left basilar atelectasis or infiltrate is noted. Minimal right basilar subsegmental atelectasis or scarring is noted with possible small pleural effusion. Bony thorax is unremarkable. IMPRESSION: Stable bilateral chronic interstitial densities are noted. Stable left basilar atelectasis or infiltrate is noted. Minimal right basilar subsegmental atelectasis or scarring is noted with possible small right pleural  effusion. Electronically Signed   By: Lynwood Landy Raddle M.D.   On: 10/26/2023 10:25   DG Chest 1 View Result Date: 10/26/2023 CLINICAL DATA:  Shortness of breath EXAM: PORTABLE CHEST 1 VIEW COMPARISON:  10/24/2023 FINDINGS: Cardiac shadow is stable. Aortic calcifications are noted. Right chest wall port is again seen. Chronic interstitial changes are again noted and stable. Persistent airspace opacity in the left base is noted. The spiculated changes in the right upper lobe are less prominent than that seen on the prior exam. No bony abnormality is noted. IMPRESSION: Stable appearance of the chest when compared with the prior exam. Left basilar  infiltrative density is seen. Electronically Signed   By: Oneil Devonshire M.D.   On: 10/26/2023 03:40   DG Chest Port 1 View Result Date: 10/24/2023 CLINICAL DATA:  Chest pain and shortness of breath EXAM: PORTABLE CHEST 1 VIEW COMPARISON:  Chest x-ray 09/17/2023.  CT chest 08/04/2023 FINDINGS: Right chest port catheter tip ends in the distal SVC. The lungs are hyperinflated compatible with emphysema, unchanged. Increased patchy airspace opacities identified in the left lower lobe. Focal opacity in the right mid lung and linear atelectasis or scarring in the right lung base persists. Spiculated ill-defined nodular density in the right upper lobe appears unchanged. Right chest port catheter tip ends in the distal SVC. The heart is mildly enlarged. There is no pleural effusion or pneumothorax. IMPRESSION: 1. New airspace opacities in the left lower lobe, suspicious for pneumonia. 2. Stable focal opacity in the right mid lung and linear atelectasis or scarring in the right lung base. 3. Stable spiculated ill-defined nodular density in the right upper lobe. 4. Emphysema. Electronically Signed   By: Greig Pique M.D.   On: 10/24/2023 21:04   CT ABDOMEN PELVIS WO CONTRAST Result Date: 10/20/2023 CLINICAL DATA:  Bowel obstruction suspected.  Constipation. EXAM: CT ABDOMEN AND  PELVIS WITHOUT CONTRAST TECHNIQUE: Multidetector CT imaging of the abdomen and pelvis was performed following the standard protocol without IV contrast. RADIATION DOSE REDUCTION: This exam was performed according to the departmental dose-optimization program which includes automated exposure control, adjustment of the mA and/or kV according to patient size and/or use of iterative reconstruction technique. COMPARISON:  08/04/2023 FINDINGS: Lower chest: Peribronchial thickening. Nodular opacities in the left lower lobe could reflect atelectasis or early bronchopneumonia. Dependent opacities in the right lower lobe, likely atelectasis. No effusions. Hepatobiliary: No focal liver abnormality is seen. Status post cholecystectomy. No biliary dilatation. Pancreas: No focal abnormality or ductal dilatation. Spleen: No focal abnormality.  Normal size. Adrenals/Urinary Tract: Bilateral renal hypodensities are similar prior study and likely reflect small cysts. No follow-up imaging recommended. No stones or hydronephrosis. Adrenal glands and urinary bladder unremarkable. Stomach/Bowel: Left colonic diverticulosis. No active diverticulitis. No bowel obstruction. Stomach and small bowel decompressed, unremarkable. Vascular/Lymphatic: Aortoiliac atherosclerosis. No evidence of aneurysm or adenopathy. Reproductive: Calcified fibroids in the uterus.  No adnexal mass. Other: No free fluid or free air. Musculoskeletal: No acute bony abnormality. IMPRESSION: No evidence of bowel obstruction. Left colonic diverticulosis. No active diverticulitis. Coronary artery disease, aortoiliac atherosclerosis. Bronchial wall thickening in the lower lobes with patchy nodular opacities in the left lower lobe which could reflect atelectasis or early bronchopneumonia. Right base atelectasis. Electronically Signed   By: Franky Crease M.D.   On: 10/20/2023 19:05     Subjective: Pt says she is breathing back to her baseline now and on home oxygen   requirements  Discharge Exam: Vitals:   10/30/23 1127 10/30/23 1200  BP:  122/80  Pulse:    Resp:  (!) 26  Temp: 98.1 F (36.7 C)   SpO2:  100%   Vitals:   10/30/23 1000 10/30/23 1100 10/30/23 1127 10/30/23 1200  BP: 116/71 114/68  122/80  Pulse:  (!) 101    Resp: (!) 41 (!) 29  (!) 26  Temp:   98.1 F (36.7 C)   TempSrc:   Oral   SpO2: 98% 97%  100%  Weight:      Height:        General: Pt is alert, awake, not in acute distress,cachectic, frail, chronically ill appearing. Cardiovascular: normal  S1/S2 +, no rubs, no gallops Respiratory: good air movement heard bilateral Abdominal: Soft, NT, ND, bowel sounds + Extremities: no edema, no cyanosis   The results of significant diagnostics from this hospitalization (including imaging, microbiology, ancillary and laboratory) are listed below for reference.     Microbiology: Recent Results (from the past 240 hours)  Culture, blood (routine x 2) Call MD if unable to obtain prior to antibiotics being given     Status: None   Collection Time: 10/25/23  2:00 AM   Specimen: BLOOD RIGHT HAND  Result Value Ref Range Status   Specimen Description BLOOD RIGHT HAND  Final   Special Requests   Final    BOTTLES DRAWN AEROBIC AND ANAEROBIC Blood Culture adequate volume   Culture   Final    NO GROWTH 5 DAYS Performed at [facility], [address], [place], [place] [address]    Report Status 10/30/2023 FINAL  Final  Culture, blood (routine x 2) Call MD if unable to obtain prior to antibiotics being given     Status: None   Collection Time: 10/25/23  2:06 AM   Specimen: BLOOD LEFT HAND  Result Value Ref Range Status   Specimen Description BLOOD LEFT HAND  Final   Special Requests   Final    BOTTLES DRAWN AEROBIC AND ANAEROBIC Blood Culture adequate volume   Culture   Final    NO GROWTH 5 DAYS Performed at [facility], [address] [place], [place] [address]    Report Status 10/30/2023 FINAL  Final  MRSA Next Gen by PCR, Nasal      Status: None   Collection Time: 10/25/23  4:11 PM   Specimen: Nasal Mucosa; Nasal Swab  Result Value Ref Range Status   MRSA by PCR Next Gen NOT DETECTED NOT DETECTED Final    Comment: (NOTE) The GeneXpert MRSA Assay (FDA approved for NASAL specimens only), is one component of a comprehensive MRSA colonization surveillance program. It is not intended to diagnose MRSA infection nor to guide or monitor treatment for MRSA infections. Test performance is not FDA approved in patients less than 20 years old. Performed at [facility], [address], [place], [place] [address]   Respiratory (~20 pathogens) panel by PCR     Status: None   Collection Time: 10/26/23  1:41 AM   Specimen: Nasopharyngeal Swab; Respiratory  Result Value Ref Range Status   Adenovirus NOT DETECTED NOT DETECTED Final   Coronavirus 229E NOT DETECTED NOT DETECTED Final    Comment: (NOTE) The Coronavirus on the Respiratory Panel, DOES NOT test for the novel  Coronavirus (2019 nCoV)    Coronavirus HKU1 NOT DETECTED NOT DETECTED Final   Coronavirus NL63 NOT DETECTED NOT DETECTED Final   Coronavirus OC43 NOT DETECTED NOT DETECTED Final   Metapneumovirus NOT DETECTED NOT DETECTED Final   Rhinovirus / Enterovirus NOT DETECTED NOT DETECTED Final   Influenza A NOT DETECTED NOT DETECTED Final   Influenza B NOT DETECTED NOT DETECTED Final   Parainfluenza Virus 1 NOT DETECTED NOT DETECTED Final   Parainfluenza Virus 2 NOT DETECTED NOT DETECTED Final   Parainfluenza Virus 3 NOT DETECTED NOT DETECTED Final   Parainfluenza Virus 4 NOT DETECTED NOT DETECTED Final   Respiratory Syncytial Virus NOT DETECTED NOT DETECTED Final   Bordetella pertussis NOT DETECTED NOT DETECTED Final   Bordetella Parapertussis NOT DETECTED NOT DETECTED Final   Chlamydophila pneumoniae NOT DETECTED NOT DETECTED Final   Mycoplasma pneumoniae NOT DETECTED NOT DETECTED Final  Comment: Performed at [facility], [address]  [place], [place] [address]     Labs: BNP (last 3 results) Recent Labs    08/10/23 0444 10/20/23 1805 10/28/23 0414  BNP 354.0* 599.0* 375.0*   Basic Metabolic Panel: Recent Labs  Lab 10/24/23 2038 10/25/23 0551 10/26/23 0335 10/27/23 0424 10/28/23 0414  NA 138 136 140 141 142  K 4.1 4.1 4.5 5.4* 4.2  CL 94* 93* 96* 93* 86*  CO2 32 32 36* 39* 44*  GLUCOSE 107* 111* 140* 139* 123*  BUN 12 20 15 23 18   CREATININE 0.51 0.38* 0.43* 0.51 0.33*  CALCIUM  8.8* 8.4* 8.5* 9.0 9.0  MG  --  1.7  --   --   --   PHOS  --  3.7  --   --   --    Liver Function Tests: Recent Labs  Lab 10/24/23 2038 10/25/23 0551 10/26/23 0335 10/27/23 0424 10/28/23 0414  AST 19 20 19 17  13*  ALT 19 20 22 21 17   ALKPHOS 187* 168* 157* 159* 148*  BILITOT 0.2 0.5 0.8 0.3 0.4  PROT 6.5 5.9* 6.0* 6.7 6.2*  ALBUMIN  3.3* 3.0* 3.0* 3.2* 3.2*   No results for input(s): LIPASE, AMYLASE in the last 168 hours. No results for input(s): AMMONIA in the last 168 hours. CBC: Recent Labs  Lab 10/24/23 2038 10/25/23 0551 10/26/23 0335 10/27/23 0424 10/28/23 0414 10/29/23 0436 10/30/23 0420  WBC 7.8   < > 9.8 10.9* 9.9 8.6 8.6  NEUTROABS 6.5  --   --   --   --   --   --   HGB 8.0*   < > 9.3* 9.9* 9.7* 9.6* 10.5*  HCT 26.9*   < > 31.7* 34.1* 33.2* 33.9* 35.2*  MCV 78.0*   < > 80.5 83.4 82.8 82.9 82.2  PLT 408*   < > 356 412* 440* 377 400   < > = values in this interval not displayed.   Cardiac Enzymes: No results for input(s): CKTOTAL, CKMB, CKMBINDEX, TROPONINI in the last 168 hours. BNP: Invalid input(s): POCBNP CBG: No results for input(s): GLUCAP in the last 168 hours. D-Dimer No results for input(s): DDIMER in the last 72 hours. Hgb A1c No results for input(s): HGBA1C in the last 72 hours. Lipid Profile No results for input(s): CHOL, HDL, LDLCALC, TRIG, CHOLHDL, LDLDIRECT in the last 72 hours. Thyroid  function studies No results for input(s): TSH, T4TOTAL,  T3FREE, THYROIDAB in the last 72 hours.  Invalid input(s): FREET3 Anemia work up No results for input(s): VITAMINB12, FOLATE, FERRITIN, TIBC, IRON , RETICCTPCT in the last 72 hours. Urinalysis    Component Value Date/Time   COLORURINE YELLOW 07/16/2022 1928   APPEARANCEUR CLEAR 07/16/2022 1928   LABSPEC 1.014 07/16/2022 1928   PHURINE 5.0 07/16/2022 1928   GLUCOSEU NEGATIVE 07/16/2022 1928   HGBUR NEGATIVE 07/16/2022 1928   BILIRUBINUR NEGATIVE 07/16/2022 1928   KETONESUR NEGATIVE 07/16/2022 1928   PROTEINUR NEGATIVE 07/16/2022 1928   UROBILINOGEN 0.2 03/31/2014 1406   NITRITE NEGATIVE 07/16/2022 1928   LEUKOCYTESUR NEGATIVE 07/16/2022 1928   Sepsis Labs Recent Labs  Lab 10/27/23 0424 10/28/23 0414 10/29/23 0436 10/30/23 0420  WBC 10.9* 9.9 8.6 8.6   Microbiology Recent Results (from the past 240 hours)  Culture, blood (routine x 2) Call MD if unable to obtain prior to antibiotics being given     Status: None   Collection Time: 10/25/23  2:00 AM   Specimen: BLOOD RIGHT HAND  Result Value Ref Range Status   Specimen Description BLOOD RIGHT HAND  Final   Special Requests   Final    BOTTLES DRAWN AEROBIC AND ANAEROBIC Blood Culture adequate volume   Culture   Final    NO GROWTH 5 DAYS Performed at [facility], [address] [place], [place] [address]    Report Status 10/30/2023 FINAL  Final  Culture, blood (routine x 2) Call MD if unable to obtain prior to antibiotics being given     Status: None   Collection Time: 10/25/23  2:06 AM   Specimen: BLOOD LEFT HAND  Result Value Ref Range Status   Specimen Description BLOOD LEFT HAND  Final   Special Requests   Final    BOTTLES DRAWN AEROBIC AND ANAEROBIC Blood Culture adequate volume   Culture   Final    NO GROWTH 5 DAYS Performed at [facility], [address] [place], [place] [address]    Report Status 10/30/2023 FINAL  Final  MRSA Next Gen by PCR, Nasal     Status: None   Collection Time:  10/25/23  4:11 PM   Specimen: Nasal Mucosa; Nasal Swab  Result Value Ref Range Status   MRSA by PCR Next Gen NOT DETECTED NOT DETECTED Final    Comment: (NOTE) The GeneXpert MRSA Assay (FDA approved for NASAL specimens only), is one component of a comprehensive MRSA colonization surveillance program. It is not intended to diagnose MRSA infection nor to guide or monitor treatment for MRSA infections. Test performance is not FDA approved in patients less than 63 years old. Performed at [facility], [address] [place], [place] [address]   Respiratory (~20 pathogens) panel by PCR     Status: None   Collection Time: 10/26/23  1:41 AM   Specimen: Nasopharyngeal Swab; Respiratory  Result Value Ref Range Status   Adenovirus NOT DETECTED NOT DETECTED Final   Coronavirus 229E NOT DETECTED NOT DETECTED Final    Comment: (NOTE) The Coronavirus on the Respiratory Panel, DOES NOT test for the novel  Coronavirus (2019 nCoV)    Coronavirus HKU1 NOT DETECTED NOT DETECTED Final   Coronavirus NL63 NOT DETECTED NOT DETECTED Final   Coronavirus OC43 NOT DETECTED NOT DETECTED Final   Metapneumovirus NOT DETECTED NOT DETECTED Final   Rhinovirus / Enterovirus NOT DETECTED NOT DETECTED Final   Influenza A NOT DETECTED NOT DETECTED Final   Influenza B NOT DETECTED NOT DETECTED Final   Parainfluenza Virus 1 NOT DETECTED NOT DETECTED Final   Parainfluenza Virus 2 NOT DETECTED NOT DETECTED Final   Parainfluenza Virus 3 NOT DETECTED NOT DETECTED Final   Parainfluenza Virus 4 NOT DETECTED NOT DETECTED Final   Respiratory Syncytial Virus NOT DETECTED NOT DETECTED Final   Bordetella pertussis NOT DETECTED NOT DETECTED Final   Bordetella Parapertussis NOT DETECTED NOT DETECTED Final   Chlamydophila pneumoniae NOT DETECTED NOT DETECTED Final   Mycoplasma pneumoniae NOT DETECTED NOT DETECTED Final    Comment: Performed at [facility], [address], [place], [place] [address]   Time coordinating  discharge: 35 mins  SIGNED:  Afton Louder, MD  [facility] 10/30/2023, 1:22 PM How to contact the [facility] Attending or Consulting provider 7A - 7P or covering provider during after hours 7P -7A, for this patient?  Check the care team in [facility] and look for a) attending/consulting [facility] provider listed and b) the TRH team listed Log into [email or web address] and use [place]'s universal password to access. If you do  not have the password, please contact the hospital operator. Locate the [facility] provider you are looking for under [facility] and page to a number that you can be directly reached. If you still have difficulty reaching the provider, please page the [facility] (Director on Call) for the Hospitalists listed on amion for assistance.

## 2023-10-30 NOTE — Progress Notes (Signed)
 Discharge instructions reviewed with patient's grandson. Patient's IV removed from right upper arm and port needle pulled with heparin  flush. No concerns with discharge paperwork

## 2023-10-30 NOTE — Progress Notes (Signed)
 Physical Therapy Treatment Patient Details Name: Courtney Zuniga MRN: [id number] DOB: Jun 04, 1950 Today's Date: 10/30/2023   History of Present Illness Courtney Zuniga is a 73 y.o. female with medical history significant of COPD, asthma, chronic respiratory failure with hypoxia on supplemental oxygen  via [place] at 4 LPM, coronary artery disease, depression and lung cancer who presents to the emergency department due to several days onset of left-sided chest pain with progressive shortness of breath and several weeks of nonproductive cough.  She was noted to have black stool and her home health nurse states that this may be due to blood in her stool.  Patient endorsed taking Pepto-Bismol recently.    PT Comments  Patient agreeable to physical therapy today. Patient continues to present functioning near baseline for functional mobility and gait demonstrating slight improvement completing transfers and ambulation without the use of RW in the room and hallway without loss of balance on 4 LPM O2 with SpO2 at 97%. Patient will benefit from continued skilled physical therapy in hospital and recommended venue below to increase strength, balance, endurance for safe ADLs and gait.    If plan is discharge home, recommend the following: A little help with walking and/or transfers;A little help with bathing/dressing/bathroom;Help with stairs or ramp for entrance;Assistance with cooking/housework   Can travel by private vehicle        Equipment Recommendations  None recommended by PT    Recommendations for Other Services       Precautions / Restrictions Precautions Precautions: Fall Recall of Precautions/Restrictions: Intact Restrictions Weight Bearing Restrictions Per Provider Order: No     Mobility  Bed Mobility Overal bed mobility: Modified Independent Bed Mobility: Supine to Sit     Supine to sit: Modified independent (Device/Increase time), Supervision     General bed mobility comments:  slightly labored movement    Transfers Overall transfer level: Modified independent Equipment used: None Transfers: Sit to/from Stand, Bed to chair/wheelchair/BSC Sit to Stand: Supervision, Modified independent (Device/Increase time)   Step pivot transfers: Supervision, Modified independent (Device/Increase time)            Ambulation/Gait Ambulation/Gait assistance: Contact guard assist, Modified independent (Device/Increase time) Gait Distance (Feet): 100 Feet Assistive device: None Gait Pattern/deviations: Decreased step length - right, Decreased step length - left, Decreased stride length Gait velocity: decreased     General Gait Details: slightly labored movement with good return for ambulation in room, hallways without loss of balance while on 4 LPM O2 with SpO2 at 97%, contact guard for steadiness   Stairs             Wheelchair Mobility     Tilt Bed    Modified Rankin (Stroke Patients Only)       Balance Overall balance assessment: Needs assistance Sitting-balance support: No upper extremity supported, Feet supported Sitting balance-Leahy Scale: Good Sitting balance - Comments: good seated at EOB   Standing balance support: No upper extremity supported, During functional activity Standing balance-Leahy Scale: Fair Standing balance comment: labored movement, no AD                            Communication Communication Communication: No apparent difficulties  Cognition Arousal: Alert Behavior During Therapy: WFL for tasks assessed/performed   PT - Cognitive impairments: No apparent impairments                         Following commands: Intact  Cueing Cueing Techniques: Verbal cues, Tactile cues  Exercises      General Comments        Pertinent Vitals/Pain Pain Assessment Pain Assessment: No/denies pain    Home Living                          Prior Function            PT Goals (current  goals can now be found in the care plan section) Acute Rehab PT Goals Patient Stated Goal: return home aides to assist PT Goal Formulation: With patient Time For Goal Achievement: 11/02/23 Potential to Achieve Goals: Good Progress towards PT goals: Progressing toward goals    Frequency    Min 2X/week      PT Plan      Co-evaluation              AM-PAC PT 6 Clicks Mobility   Outcome Measure  Help needed turning from your back to your side while in a flat bed without using bedrails?: None Help needed moving from lying on your back to sitting on the side of a flat bed without using bedrails?: None Help needed moving to and from a bed to a chair (including a wheelchair)?: None Help needed standing up from a chair using your arms (e.g., wheelchair or bedside chair)?: None Help needed to walk in hospital room?: A Little Help needed climbing 3-5 steps with a railing? : A Little 6 Click Score: 22    End of Session Equipment Utilized During Treatment: Gait belt Activity Tolerance: Patient tolerated treatment well;No increased pain Patient left: Other (comment) (on besides BSC, nursing informed) Nurse Communication: Mobility status PT Visit Diagnosis: Unsteadiness on feet (R26.81);Other abnormalities of gait and mobility (R26.89);Muscle weakness (generalized) (M62.81)     Time: 8942-8881 PT Time Calculation (min) (ACUTE ONLY): 21 min  Charges:    $Gait Training: 8-22 mins $Therapeutic Activity: 8-22 mins PT General Charges $$ ACUTE PT VISIT: 1 Visit                       Brandyce Dimario, SPT

## 2023-10-30 NOTE — Plan of Care (Signed)

## 2023-10-30 NOTE — Discharge Instructions (Signed)
IMPORTANT INFORMATION: PAY CLOSE ATTENTION  ? ?PHYSICIAN DISCHARGE INSTRUCTIONS ? ?Follow with Primary care provider  Adams, Erica W, FNP  and other consultants as instructed by your Hospitalist Physician ? ?SEEK MEDICAL CARE OR RETURN TO EMERGENCY ROOM IF SYMPTOMS COME BACK, WORSEN OR NEW PROBLEM DEVELOPS  ? ?Please note: ?You were cared for by a hospitalist during your hospital stay. Every effort will be made to forward records to your primary care provider.  You can request that your primary care provider send for your hospital records if they have not received them.  Once you are discharged, your primary care physician will handle any further medical issues. Please note that NO REFILLS for any discharge medications will be authorized once you are discharged, as it is imperative that you return to your primary care physician (or establish a relationship with a primary care physician if you do not have one) for your post hospital discharge needs so that they can reassess your need for medications and monitor your lab values. ? ?Please get a complete blood count and chemistry panel checked by your Primary MD at your next visit, and again as instructed by your Primary MD. ? ?Get Medicines reviewed and adjusted: ?Please take all your medications with you for your next visit with your Primary MD ? ?Laboratory/radiological data: ?Please request your Primary MD to go over all hospital tests and procedure/radiological results at the follow up, please ask your primary care provider to get all Hospital records sent to his/her office. ? ?In some cases, they will be blood work, cultures and biopsy results pending at the time of your discharge. Please request that your primary care provider follow up on these results. ? ?If you are diabetic, please bring your blood sugar readings with you to your follow up appointment with primary care.   ? ?Please call and make your follow up appointments as soon as possible.   ? ?Also Note  the following: ?If you experience worsening of your admission symptoms, develop shortness of breath, life threatening emergency, suicidal or homicidal thoughts you must seek medical attention immediately by calling 911 or calling your MD immediately  if symptoms less severe. ? ?You must read complete instructions/literature along with all the possible adverse reactions/side effects for all the Medicines you take and that have been prescribed to you. Take any new Medicines after you have completely understood and accpet all the possible adverse reactions/side effects.  ? ?Do not drive when taking Pain medications or sleeping medications (Benzodiazepines) ? ?Do not take more than prescribed Pain, Sleep and Anxiety Medications. It is not advisable to combine anxiety,sleep and pain medications without talking with your primary care practitioner ? ?Special Instructions: If you have smoked or chewed Tobacco  in the last 2 yrs please stop smoking, stop any regular Alcohol  and or any Recreational drug use. ? ?Wear Seat belts while driving.  Do not drive if taking any narcotic, mind altering or controlled substances or recreational drugs or alcohol.  ? ? ? ? ? ?

## 2023-10-30 NOTE — TOC CM/SW Note (Addendum)
 Pt to dc to her neighbors house: [facility]  [phone] [address] [place], [place] [address]  [facility] (CAP aide) discussed with daughter, Avelina, and referral form given.   Pt is active c/[facility] outpatient palliative.  No HH agencies will accept pt, either due to staffing, location or insurance. [facility], [facility], [facility], [facility], [facility], [facility] all refused. Daughter updated and instructed to discuss HH needs with pt's PCP.

## 2023-11-05 ENCOUNTER — Encounter: Payer: Self-pay | Admitting: Gastroenterology

## 2023-11-17 ENCOUNTER — Other Ambulatory Visit: Payer: Self-pay

## 2023-11-17 DIAGNOSIS — C3490 Malignant neoplasm of unspecified part of unspecified bronchus or lung: Secondary | ICD-10-CM

## 2023-11-18 ENCOUNTER — Inpatient Hospital Stay: Attending: Internal Medicine

## 2023-11-18 ENCOUNTER — Ambulatory Visit (HOSPITAL_COMMUNITY)
Admission: RE | Admit: 2023-11-18 | Discharge: 2023-11-18 | Disposition: A | Discharge status: Home / Self Care | Source: Ambulatory Visit | Attending: Physician Assistant | Admitting: Physician Assistant

## 2023-11-18 DIAGNOSIS — D500 Iron deficiency anemia secondary to blood loss (chronic): Secondary | ICD-10-CM

## 2023-11-18 DIAGNOSIS — C3490 Malignant neoplasm of unspecified part of unspecified bronchus or lung: Secondary | ICD-10-CM

## 2023-11-18 DIAGNOSIS — D509 Iron deficiency anemia, unspecified: Secondary | ICD-10-CM | POA: Insufficient documentation

## 2023-11-18 DIAGNOSIS — C342 Malignant neoplasm of middle lobe, bronchus or lung: Secondary | ICD-10-CM | POA: Diagnosis present

## 2023-11-18 DIAGNOSIS — K5520 Angiodysplasia of colon without hemorrhage: Secondary | ICD-10-CM

## 2023-11-18 DIAGNOSIS — R911 Solitary pulmonary nodule: Secondary | ICD-10-CM | POA: Diagnosis not present

## 2023-11-18 LAB — CBC WITH DIFFERENTIAL (CANCER CENTER ONLY)
Abs Immature Granulocytes: 0.03 K/uL (ref 0.00–0.07)
Basophils Absolute: 0 K/uL (ref 0.0–0.1)
Basophils Relative: 0 %
Eosinophils Absolute: 0 K/uL (ref 0.0–0.5)
Eosinophils Relative: 0 %
HCT: 37.4 % (ref 36.0–46.0)
Hemoglobin: 11.6 g/dL — ABNORMAL LOW (ref 12.0–15.0)
Immature Granulocytes: 0 %
Lymphocytes Relative: 2 %
Lymphs Abs: 0.3 K/uL — ABNORMAL LOW (ref 0.7–4.0)
MCH: 25.2 pg — ABNORMAL LOW (ref 26.0–34.0)
MCHC: 31 g/dL (ref 30.0–36.0)
MCV: 81.1 fL (ref 80.0–100.0)
Monocytes Absolute: 0.2 K/uL (ref 0.1–1.0)
Monocytes Relative: 1 %
Neutro Abs: 15.7 K/uL — ABNORMAL HIGH (ref 1.7–7.7)
Neutrophils Relative %: 97 %
Platelet Count: 207 K/uL (ref 150–400)
RBC: 4.61 MIL/uL (ref 3.87–5.11)
RDW: 23.2 % — ABNORMAL HIGH (ref 11.5–15.5)
WBC Count: 16.3 K/uL — ABNORMAL HIGH (ref 4.0–10.5)
nRBC: 0 % (ref 0.0–0.2)

## 2023-11-18 LAB — CMP (CANCER CENTER ONLY)
ALT: 15 U/L (ref 0–44)
AST: 15 U/L (ref 15–41)
Albumin: 3.9 g/dL (ref 3.5–5.0)
Alkaline Phosphatase: 157 U/L — ABNORMAL HIGH (ref 38–126)
Anion gap: 4 — ABNORMAL LOW (ref 5–15)
BUN: 19 mg/dL (ref 8–23)
CO2: 37 mmol/L — ABNORMAL HIGH (ref 22–32)
Calcium: 9 mg/dL (ref 8.9–10.3)
Chloride: 100 mmol/L (ref 98–111)
Creatinine: 0.51 mg/dL (ref 0.44–1.00)
GFR, Estimated: >60 mL/min (ref >60)
Glucose, Bld: 204 mg/dL — ABNORMAL HIGH (ref 70–99)
Potassium: 4.1 mmol/L (ref 3.5–5.1)
Sodium: 141 mmol/L (ref 135–145)
Total Bilirubin: 0.4 mg/dL (ref 0.0–1.2)
Total Protein: 6.1 g/dL — ABNORMAL LOW (ref 6.5–8.1)

## 2023-11-18 MED ORDER — IOHEXOL 300 MG/ML  SOLN
100.0000 mL | Freq: Once | INTRAMUSCULAR | Status: AC | PRN
  Administered 2023-11-18: 100 mL via INTRAVENOUS

## 2023-11-18 MED ORDER — HEPARIN SOD (PORK) LOCK FLUSH 100 UNIT/ML IV SOLN
INTRAVENOUS | Status: AC
  Filled 2023-11-18: qty 5

## 2023-11-18 MED ORDER — SODIUM CHLORIDE 0.9% FLUSH
10.0000 mL | Freq: Once | INTRAVENOUS | Status: AC
Start: 2023-11-18 — End: 2023-11-18
  Administered 2023-11-18: 10 mL

## 2023-11-18 MED ORDER — HEPARIN SOD (PORK) LOCK FLUSH 100 UNIT/ML IV SOLN
500.0000 [IU] | Freq: Once | INTRAVENOUS | Status: AC
  Administered 2023-11-18: 500 [IU] via INTRAVENOUS

## 2023-11-23 NOTE — Progress Notes (Deleted)
 Referring Provider: Myra Geni ORN, FNP Primary Care Physician:  Myra Geni ORN, FNP Primary GI Physician: Dr. Cindie  No chief complaint on file.   HPI:   Courtney Zuniga is a 73 y.o. female with metastatic non-small lung cancer currently on surveillance, chronic respiratory failure due to COPD on continuous home oxygen , GERD, hypertension, coronary artery disease, NSTEMI, aortic regurgitation and stenosis, peptic ulcer disease and angioectasia , chronic iron  deficiency anemia, presenting today for hospital follow-up.  Patient was admitted to the hospital 10/24/2023 - 10/30/2023 with COPD exacerbation, pneumonia, and GI consulted for iron  deficiency anemia, black stool, history of PUD.  Regarding IDA, black/heme positive stool, patient reported black stool at home, but she also takes iron  daily.  Hemoglobin was 7.4 on admission with a ferritin of 5.  This was fairly consistent with her baseline hemoglobin in the 7-8 range.  Noted surveillance EGD was never performed following EGD in February 2020 for which showed gastric ulcer and multiple small bowel AVMs that were treated with APC.  She continues to take BC powders daily along with ibuprofen  every other day.  Ideally, patient needed EGD and colonoscopy, but considering acute on chronic respiratory failure, she was treated conservatively with IV PPI twice daily.  She received 2 units PRBCs as well as IV iron  during admission.  Hemoglobin improved to 10.5 at the time of discharge.  Due to constipation, she was also started on Linzess  145 mcg daily.  CBC updated 11/18/2023 with hemoglobin improved to 11.6. She also received Venofer  infusion on 11/18/2023 with hematology.   Today:     EGD Feb 2024 with non-bleeding gastric ulcer, single AVM duodenum s/p APC, multiple additional AVMs in duodenum s/p APC.   Last colonoscopy in 2010 with poor prep.   Past Medical History:  Diagnosis Date   Anemia    as a young woman   Arthritis     osteoartritis   Asthma    Brain tumor (benign) (HCC) 2005 [facility]   Benign   Chronic headaches    Chronic hip pain    Chronic pain    COPD (chronic obstructive pulmonary disease) (HCC)    Coronary artery disease    Depression    Depression 05/15/2016   Encounter for antineoplastic chemotherapy 01/10/2016   GERD (gastroesophageal reflux disease)    Hypertension    Lung cancer (HCC) dx'd 01/2016   currently on chemo and radiation    NSTEMI (non-ST elevated myocardial infarction) (HCC) yrs ago   On home O2    qhs 2 liters at hs and prn   Pneumonia last time 2 yrs ago   Shortness of breath dyspnea    with activity    Past Surgical History:  Procedure Laterality Date   CHOLECYSTECTOMY     COLONOSCOPY  2015   Results requested from [facility]   COLONOSCOPY     ESOPHAGOGASTRODUODENOSCOPY N/A 08/14/2015   Procedure: ESOPHAGOGASTRODUODENOSCOPY (EGD);  Surgeon: Lamar CHRISTELLA Hollingshead, MD;  Location: [facility];  Service: Endoscopy;  Laterality: N/A;  215    ESOPHAGOGASTRODUODENOSCOPY (EGD) WITH PROPOFOL  N/A 09/13/2015   Procedure: ESOPHAGOGASTRODUODENOSCOPY (EGD) WITH PROPOFOL ;  Surgeon: Toribio SHAUNNA Cedar, MD;  Location: [facility];  Service: Endoscopy;  Laterality: N/A;   ESOPHAGOGASTRODUODENOSCOPY (EGD) WITH PROPOFOL  N/A 05/19/2022   Procedure: ESOPHAGOGASTRODUODENOSCOPY (EGD) WITH PROPOFOL ;  Surgeon: Cindie Carlin POUR, DO;  [facility]: [facility];  Service: Endoscopy;  Laterality: N/A;   EUS N/A 03/12/2017   Procedure: UPPER ENDOSCOPIC ULTRASOUND (EUS) RADIAL;  Surgeon: Teressa Toribio SQUIBB, MD;  Location: [facility];  Service: Endoscopy;  Laterality: N/A;   HOT HEMOSTASIS  05/19/2022   Procedure: HOT HEMOSTASIS (ARGON PLASMA COAGULATION/BICAP);  Surgeon: Cindie Carlin POUR, DO;  Location: [facility];  Service: Endoscopy;;   IR IMAGING GUIDED PORT INSERTION  01/20/2022   TUMOR REMOVAL  2005   Benign   UPPER ESOPHAGEAL ENDOSCOPIC ULTRASOUND (EUS)  09/13/2015   Procedure: UPPER  ESOPHAGEAL ENDOSCOPIC ULTRASOUND (EUS);  Surgeon: Toribio SQUIBB Teressa, MD;  Location: [facility];  Service: Endoscopy;;   VIDEO BRONCHOSCOPY WITH ENDOBRONCHIAL NAVIGATION N/A 12/31/2015   Procedure: VIDEO BRONCHOSCOPY WITH ENDOBRONCHIAL NAVIGATION;  Surgeon: Elspeth JAYSON Millers, MD;  Location: [facility];  Service: Thoracic;  Laterality: N/A;   VIDEO BRONCHOSCOPY WITH ENDOBRONCHIAL ULTRASOUND N/A 11/08/2015   Procedure: VIDEO BRONCHOSCOPY WITH ENDOBRONCHIAL ULTRASOUND;  Surgeon: Maude Fleeta Ochoa, MD;  Location: [facility] OR;  Service: Thoracic;  Laterality: N/A;    Current Outpatient Medications  Medication Sig Dispense Refill   Aspirin -Salicylamide-Caffeine  (BC HEADACHE POWDER PO) Take 1 packet by mouth as needed (headache).     benzonatate (TESSALON) 100 MG capsule Take 100 mg by mouth 3 (three) times daily as needed for cough.     bisoprolol  (ZEBETA ) 5 MG tablet Take 0.5 tablets (2.5 mg total) by mouth daily. 45 tablet 0   docusate sodium  (COLACE) 100 MG capsule Take 1 capsule (100 mg total) by mouth 2 (two) times daily. 60 capsule 2   ENSURE (ENSURE) Take 237 mLs by mouth 3 (three) times daily between meals.     ferrous sulfate  325 (65 FE) MG tablet Take 1 tablet (325 mg total) by mouth daily with breakfast. 30 tablet 0   Fluticasone -Umeclidin-Vilant (TRELEGY ELLIPTA ) 100-62.5-25 MCG/ACT AEPB INHALE 1 PUFF BY MOUTH EVERY DAY     furosemide  (LASIX ) 20 MG tablet Take 1 tablet (20 mg total) by mouth every Monday, Wednesday, and Friday. 12 tablet 1   Ipratropium-Albuterol  (COMBIVENT  RESPIMAT) 20-100 MCG/ACT AERS respimat Inhale 1 puff into the lungs every 6 (six) hours as needed for wheezing or shortness of breath. 4 g 3   megestrol  (MEGACE ) 40 MG/ML suspension Take 400 mg by mouth 3 (three) times daily.     pantoprazole  (PROTONIX ) 40 MG tablet Take 1 tablet (40 mg total) by mouth 2 (two) times daily. 60 tablet 0   Polyethylene Glycol 3350  (PEG 3350 ) 17 GM/SCOOP POWD Take 17 g by mouth daily.     predniSONE   (DELTASONE ) 10 MG tablet Take 4 PO QAM x 7 days, 2 PO QAM x 7 days, 1 PO QAM x 7 days 49 tablet 0   traZODone  (DESYREL ) 50 MG tablet Take 1 tablet (50 mg total) by mouth at bedtime. 30 tablet 5   No current facility-administered medications for this visit.    Allergies as of 11/25/2023 - Review Complete 10/28/2023  Allergen Reaction Noted   Desyrel  [trazodone ] Other (See Comments) 07/17/2021    Family History  Problem Relation Age of Onset   Ovarian cancer Mother    Lung cancer Father    Brain cancer Sister        Half sister   Lung cancer Brother    Lung cancer Brother    Prostate cancer Brother    Colon cancer Neg Hx    Colon polyps Neg Hx     Social History   Socioeconomic History   Marital status: Single    Spouse name: Not on file   Number of children: Not on file  Years of education: 10th grade   Highest education level: Not on file  Occupational History   Occupation: retired  Tobacco Use   Smoking status: Former    Current packs/day: 0.00    Average packs/day: 1 pack/day for 38.0 years (38.0 ttl pk-yrs)    Types: Cigarettes    Start date: 07/25/1980    Quit date: 07/26/2018    Years since quitting: 5.3   Smokeless tobacco: Never   Tobacco comments:    Pt has asked doctor for med to help   Vaping Use   Vaping status: Never Used  Substance and Sexual Activity   Alcohol use: No    Alcohol/week: 0.0 standard drinks of alcohol   Drug use: No   Sexual activity: Not Currently  Other Topics Concern   Not on file  Social History Narrative   Not on file   Social Drivers of Health   Financial Resource Strain: Low Risk  (05/05/2018)   Overall Financial Resource Strain (CARDIA)    Difficulty of Paying Living Expenses: Not very hard  Food Insecurity: No Food Insecurity (10/25/2023)   Hunger Vital Sign    Worried About Running Out of Food in the Last Year: Never true    Ran Out of Food in the Last Year: Never true  Transportation Needs: No Transportation Needs  (10/25/2023)   PRAPARE - Administrator, Civil Service (Medical): No    Lack of Transportation (Non-Medical): No  Physical Activity: Insufficiently Active (05/05/2018)   Exercise Vital Sign    Days of Exercise per Week: 5 days    Minutes of Exercise per Session: 10 min  Stress: No Stress Concern Present (05/05/2018)   [employer] of Occupational Health - Occupational Stress Questionnaire    Feeling of Stress : Not at all  Social Connections: Moderately Isolated (10/25/2023)   Social Connection and Isolation Panel    Frequency of Communication with Friends and Family: Three times a week    Frequency of Social Gatherings with Friends and Family: Three times a week    Attends Religious Services: 1 to 4 times per year    Active Member of Clubs or Organizations: No    Attends Banker Meetings: Never    Marital Status: Never married    Review of Systems: Gen: Denies fever, chills, anorexia. Denies fatigue, weakness, weight loss.  CV: Denies chest pain, palpitations, syncope, peripheral edema, and claudication. Resp: Denies dyspnea at rest, cough, wheezing, coughing up blood, and pleurisy. GI: Denies vomiting blood, jaundice, and fecal incontinence.   Denies dysphagia or odynophagia. Derm: Denies rash, itching, dry skin Psych: Denies depression, anxiety, memory loss, confusion. No homicidal or suicidal ideation.  Heme: Denies bruising, bleeding, and enlarged lymph nodes.  Physical Exam: There were no vitals taken for this visit. General:   Alert and oriented. No distress noted. Pleasant and cooperative.  Head:  Normocephalic and atraumatic. Eyes:  Conjuctiva clear without scleral icterus. Heart:  S1, S2 present without murmurs appreciated. Lungs:  Clear to auscultation bilaterally. No wheezes, rales, or rhonchi. No distress.  Abdomen:  +BS, soft, non-tender and non-distended. No rebound or guarding. No HSM or masses noted. Msk:  Symmetrical without gross  deformities. Normal posture. Extremities:  Without edema. Neurologic:  Alert and  oriented x4 Psych:  Normal mood and affect.    Assessment:     Plan:  ***   Josette Centers, PA-C [facility] 11/25/2023

## 2023-11-25 ENCOUNTER — Ambulatory Visit: Admitting: Gastroenterology

## 2023-11-25 ENCOUNTER — Inpatient Hospital Stay: Admitting: Internal Medicine

## 2023-11-26 ENCOUNTER — Encounter: Payer: Self-pay | Admitting: Gastroenterology

## 2023-11-29 ENCOUNTER — Emergency Department (HOSPITAL_COMMUNITY)

## 2023-11-29 ENCOUNTER — Inpatient Hospital Stay (HOSPITAL_COMMUNITY)
Admission: EM | Admit: 2023-11-29 | Discharge: 2023-12-03 | DRG: 871 | Disposition: A | Discharge status: Home Health | Attending: Internal Medicine | Admitting: Internal Medicine

## 2023-11-29 ENCOUNTER — Other Ambulatory Visit: Payer: Self-pay

## 2023-11-29 ENCOUNTER — Encounter (HOSPITAL_COMMUNITY): Payer: Self-pay

## 2023-11-29 DIAGNOSIS — Z9221 Personal history of antineoplastic chemotherapy: Secondary | ICD-10-CM

## 2023-11-29 DIAGNOSIS — I252 Old myocardial infarction: Secondary | ICD-10-CM | POA: Diagnosis not present

## 2023-11-29 DIAGNOSIS — J440 Chronic obstructive pulmonary disease with acute lower respiratory infection: Secondary | ICD-10-CM | POA: Diagnosis present

## 2023-11-29 DIAGNOSIS — Z6820 Body mass index (BMI) 20.0-20.9, adult: Secondary | ICD-10-CM | POA: Diagnosis not present

## 2023-11-29 DIAGNOSIS — E872 Acidosis, unspecified: Secondary | ICD-10-CM | POA: Diagnosis present

## 2023-11-29 DIAGNOSIS — Z7951 Long term (current) use of inhaled steroids: Secondary | ICD-10-CM

## 2023-11-29 DIAGNOSIS — Z87891 Personal history of nicotine dependence: Secondary | ICD-10-CM | POA: Diagnosis not present

## 2023-11-29 DIAGNOSIS — I5032 Chronic diastolic (congestive) heart failure: Secondary | ICD-10-CM | POA: Diagnosis present

## 2023-11-29 DIAGNOSIS — I517 Cardiomegaly: Secondary | ICD-10-CM | POA: Diagnosis not present

## 2023-11-29 DIAGNOSIS — C3491 Malignant neoplasm of unspecified part of right bronchus or lung: Secondary | ICD-10-CM | POA: Diagnosis present

## 2023-11-29 DIAGNOSIS — Z79899 Other long term (current) drug therapy: Secondary | ICD-10-CM

## 2023-11-29 DIAGNOSIS — I350 Nonrheumatic aortic (valve) stenosis: Secondary | ICD-10-CM | POA: Diagnosis present

## 2023-11-29 DIAGNOSIS — Z1152 Encounter for screening for COVID-19: Secondary | ICD-10-CM | POA: Diagnosis not present

## 2023-11-29 DIAGNOSIS — I10 Essential (primary) hypertension: Secondary | ICD-10-CM | POA: Diagnosis not present

## 2023-11-29 DIAGNOSIS — R627 Adult failure to thrive: Secondary | ICD-10-CM | POA: Diagnosis present

## 2023-11-29 DIAGNOSIS — I2510 Atherosclerotic heart disease of native coronary artery without angina pectoris: Secondary | ICD-10-CM | POA: Diagnosis present

## 2023-11-29 DIAGNOSIS — R54 Age-related physical debility: Secondary | ICD-10-CM | POA: Diagnosis present

## 2023-11-29 DIAGNOSIS — I110 Hypertensive heart disease with heart failure: Secondary | ICD-10-CM | POA: Diagnosis present

## 2023-11-29 DIAGNOSIS — A419 Sepsis, unspecified organism: Secondary | ICD-10-CM | POA: Diagnosis present

## 2023-11-29 DIAGNOSIS — R6520 Severe sepsis without septic shock: Secondary | ICD-10-CM | POA: Diagnosis present

## 2023-11-29 DIAGNOSIS — J441 Chronic obstructive pulmonary disease with (acute) exacerbation: Secondary | ICD-10-CM | POA: Diagnosis present

## 2023-11-29 DIAGNOSIS — Z9981 Dependence on supplemental oxygen: Secondary | ICD-10-CM

## 2023-11-29 DIAGNOSIS — J9611 Chronic respiratory failure with hypoxia: Secondary | ICD-10-CM | POA: Diagnosis present

## 2023-11-29 DIAGNOSIS — I5030 Unspecified diastolic (congestive) heart failure: Secondary | ICD-10-CM | POA: Diagnosis present

## 2023-11-29 DIAGNOSIS — I088 Other rheumatic multiple valve diseases: Secondary | ICD-10-CM | POA: Diagnosis not present

## 2023-11-29 DIAGNOSIS — Z923 Personal history of irradiation: Secondary | ICD-10-CM | POA: Diagnosis not present

## 2023-11-29 DIAGNOSIS — J189 Pneumonia, unspecified organism: Secondary | ICD-10-CM | POA: Diagnosis present

## 2023-11-29 DIAGNOSIS — J9612 Chronic respiratory failure with hypercapnia: Secondary | ICD-10-CM | POA: Diagnosis present

## 2023-11-29 LAB — GLUCOSE, CAPILLARY: Glucose-Capillary: 253 mg/dL — ABNORMAL HIGH (ref 70–99)

## 2023-11-29 LAB — COMPREHENSIVE METABOLIC PANEL WITH GFR
ALT: 24 U/L (ref 0–44)
AST: 19 U/L (ref 15–41)
Albumin: 3.4 g/dL — ABNORMAL LOW (ref 3.5–5.0)
Alkaline Phosphatase: 117 U/L (ref 38–126)
Anion gap: 10 (ref 5–15)
BUN: 20 mg/dL (ref 8–23)
CO2: 34 mmol/L — ABNORMAL HIGH (ref 22–32)
Calcium: 8.6 mg/dL — ABNORMAL LOW (ref 8.9–10.3)
Chloride: 94 mmol/L — ABNORMAL LOW (ref 98–111)
Creatinine, Ser: 0.56 mg/dL (ref 0.44–1.00)
GFR, Estimated: >60 mL/min (ref >60)
Glucose, Bld: 76 mg/dL (ref 70–99)
Potassium: 3.7 mmol/L (ref 3.5–5.1)
Sodium: 138 mmol/L (ref 135–145)
Total Bilirubin: 0.2 mg/dL (ref 0.0–1.2)
Total Protein: 6.2 g/dL — ABNORMAL LOW (ref 6.5–8.1)

## 2023-11-29 LAB — CBC WITH DIFFERENTIAL/PLATELET
Abs Immature Granulocytes: 0.04 K/uL (ref 0.00–0.07)
Basophils Absolute: 0 K/uL (ref 0.0–0.1)
Basophils Relative: 0 %
Eosinophils Absolute: 0 K/uL (ref 0.0–0.5)
Eosinophils Relative: 0 %
HCT: 40.7 % (ref 36.0–46.0)
Hemoglobin: 12.4 g/dL (ref 12.0–15.0)
Immature Granulocytes: 0 %
Lymphocytes Relative: 4 %
Lymphs Abs: 0.4 K/uL — ABNORMAL LOW (ref 0.7–4.0)
MCH: 26.3 pg (ref 26.0–34.0)
MCHC: 30.5 g/dL (ref 30.0–36.0)
MCV: 86.2 fL (ref 80.0–100.0)
Monocytes Absolute: 0.2 K/uL (ref 0.1–1.0)
Monocytes Relative: 3 %
Neutro Abs: 8.8 K/uL — ABNORMAL HIGH (ref 1.7–7.7)
Neutrophils Relative %: 93 %
Platelets: 198 K/uL (ref 150–400)
RBC: 4.72 MIL/uL (ref 3.87–5.11)
RDW: 23.3 % — ABNORMAL HIGH (ref 11.5–15.5)
Smear Review: NORMAL
WBC: 9.5 K/uL (ref 4.0–10.5)
nRBC: 0 % (ref 0.0–0.2)

## 2023-11-29 LAB — BRAIN NATRIURETIC PEPTIDE: B Natriuretic Peptide: 292 pg/mL — ABNORMAL HIGH (ref 0.0–100.0)

## 2023-11-29 LAB — TROPONIN I (HIGH SENSITIVITY)
Troponin I (High Sensitivity): 24 ng/L — ABNORMAL HIGH (ref <18)
Troponin I (High Sensitivity): 21 ng/L — ABNORMAL HIGH (ref <18)

## 2023-11-29 LAB — LACTIC ACID, PLASMA
Lactic Acid, Venous: 2.2 mmol/L (ref 0.5–1.9)
Lactic Acid, Venous: 1.8 mmol/L (ref 0.5–1.9)

## 2023-11-29 LAB — RESP PANEL BY RT-PCR (RSV, FLU A&B, COVID)  RVPGX2
Influenza A by PCR: NEGATIVE
Influenza B by PCR: NEGATIVE
Resp Syncytial Virus by PCR: NEGATIVE
SARS Coronavirus 2 by RT PCR: NEGATIVE

## 2023-11-29 MED ORDER — OXYCODONE-ACETAMINOPHEN 5-325 MG PO TABS
1.0000 | ORAL_TABLET | Freq: Four times a day (QID) | ORAL | Status: DC | PRN
  Administered 2023-12-01 – 2023-12-03 (×4): 1 via ORAL
  Filled 2023-11-29 (×4): qty 1

## 2023-11-29 MED ORDER — PREDNISONE 20 MG PO TABS: 40.0000 mg | ORAL_TABLET | Freq: Every day | ORAL | Status: DC

## 2023-11-29 MED ORDER — TRAZODONE HCL 50 MG PO TABS
50.0000 mg | ORAL_TABLET | Freq: Every day | ORAL | Status: DC
  Administered 2023-11-29 – 2023-12-02 (×4): 50 mg via ORAL
  Filled 2023-11-29 (×4): qty 1

## 2023-11-29 MED ORDER — DOCUSATE SODIUM 100 MG PO CAPS
100.0000 mg | ORAL_CAPSULE | Freq: Two times a day (BID) | ORAL | Status: DC
  Administered 2023-11-29 – 2023-12-03 (×8): 100 mg via ORAL
  Filled 2023-11-29 (×8): qty 1

## 2023-11-29 MED ORDER — IPRATROPIUM-ALBUTEROL 0.5-2.5 (3) MG/3ML IN SOLN
3.0000 mL | Freq: Once | RESPIRATORY_TRACT | Status: AC
  Administered 2023-11-29: 3 mL via RESPIRATORY_TRACT
  Filled 2023-11-29: qty 3

## 2023-11-29 MED ORDER — ACETAMINOPHEN 650 MG RE SUPP: 650.0000 mg | Freq: Four times a day (QID) | RECTAL | Status: DC | PRN

## 2023-11-29 MED ORDER — ONDANSETRON HCL 4 MG PO TABS: 4.0000 mg | ORAL_TABLET | Freq: Four times a day (QID) | ORAL | Status: DC | PRN

## 2023-11-29 MED ORDER — LACTATED RINGERS IV SOLN: INTRAVENOUS | Status: DC

## 2023-11-29 MED ORDER — GUAIFENESIN-DM 100-10 MG/5ML PO SYRP
15.0000 mL | ORAL_SOLUTION | Freq: Three times a day (TID) | ORAL | Status: DC
  Administered 2023-11-29 – 2023-11-30 (×2): 15 mL via ORAL
  Filled 2023-11-29 (×2): qty 15

## 2023-11-29 MED ORDER — POLYETHYLENE GLYCOL 3350 17 G PO PACK: 17.0000 g | PACK | Freq: Every day | ORAL | Status: DC | PRN

## 2023-11-29 MED ORDER — IPRATROPIUM-ALBUTEROL 0.5-2.5 (3) MG/3ML IN SOLN
3.0000 mL | Freq: Three times a day (TID) | RESPIRATORY_TRACT | Status: AC
  Administered 2023-11-29 – 2023-11-30 (×2): 3 mL via RESPIRATORY_TRACT
  Filled 2023-11-29 (×2): qty 3

## 2023-11-29 MED ORDER — ONDANSETRON HCL 4 MG/2ML IJ SOLN: 4.0000 mg | Freq: Four times a day (QID) | INTRAMUSCULAR | Status: DC | PRN

## 2023-11-29 MED ORDER — SODIUM CHLORIDE 0.9 % IV BOLUS
500.0000 mL | Freq: Once | INTRAVENOUS | Status: AC
  Administered 2023-11-29: 500 mL via INTRAVENOUS

## 2023-11-29 MED ORDER — PANTOPRAZOLE SODIUM 40 MG PO TBEC
40.0000 mg | DELAYED_RELEASE_TABLET | Freq: Two times a day (BID) | ORAL | Status: DC
  Administered 2023-11-29 – 2023-12-03 (×8): 40 mg via ORAL
  Filled 2023-11-29 (×8): qty 1

## 2023-11-29 MED ORDER — ACETAMINOPHEN 325 MG PO TABS
650.0000 mg | ORAL_TABLET | Freq: Four times a day (QID) | ORAL | Status: DC | PRN
  Administered 2023-11-30 – 2023-12-02 (×4): 650 mg via ORAL
  Filled 2023-11-29 (×4): qty 2

## 2023-11-29 MED ORDER — METHYLPREDNISOLONE SODIUM SUCC 125 MG IJ SOLR
80.0000 mg | Freq: Once | INTRAMUSCULAR | Status: AC
  Administered 2023-11-29: 80 mg via INTRAVENOUS
  Filled 2023-11-29: qty 2

## 2023-11-29 MED ORDER — SODIUM CHLORIDE 0.9 % IV SOLN
2.0000 g | Freq: Once | INTRAVENOUS | Status: AC
  Administered 2023-11-29: 2 g via INTRAVENOUS
  Filled 2023-11-29: qty 20

## 2023-11-29 MED ORDER — SODIUM CHLORIDE 0.9 % IV SOLN
500.0000 mg | INTRAVENOUS | Status: DC
  Administered 2023-11-30 – 2023-12-03 (×4): 500 mg via INTRAVENOUS
  Filled 2023-11-29 (×4): qty 5

## 2023-11-29 MED ORDER — IPRATROPIUM-ALBUTEROL 0.5-2.5 (3) MG/3ML IN SOLN
3.0000 mL | RESPIRATORY_TRACT | Status: DC | PRN
  Administered 2023-11-29: 3 mL via RESPIRATORY_TRACT
  Filled 2023-11-29: qty 3

## 2023-11-29 MED ORDER — LACTATED RINGERS IV BOLUS
500.0000 mL | Freq: Once | INTRAVENOUS | Status: AC
  Administered 2023-11-29: 500 mL via INTRAVENOUS

## 2023-11-29 MED ORDER — SODIUM CHLORIDE 0.9 % IV SOLN
500.0000 mg | Freq: Once | INTRAVENOUS | Status: AC
  Administered 2023-11-29: 500 mg via INTRAVENOUS
  Filled 2023-11-29: qty 5

## 2023-11-29 MED ORDER — IOHEXOL 350 MG/ML SOLN
75.0000 mL | Freq: Once | INTRAVENOUS | Status: AC | PRN
  Administered 2023-11-29: 75 mL via INTRAVENOUS

## 2023-11-29 MED ORDER — MEGESTROL ACETATE 40 MG/ML PO SUSP
400.0000 mg | Freq: Three times a day (TID) | ORAL | Status: DC
  Administered 2023-11-29 – 2023-12-03 (×11): 400 mg via ORAL
  Filled 2023-11-29 (×25): qty 10

## 2023-11-29 MED ORDER — METHYLPREDNISOLONE SODIUM SUCC 125 MG IJ SOLR
60.0000 mg | Freq: Two times a day (BID) | INTRAMUSCULAR | Status: DC
  Administered 2023-11-30: 60 mg via INTRAVENOUS
  Filled 2023-11-29: qty 2

## 2023-11-29 MED ORDER — PEG 3350 17 GM/SCOOP PO POWD: 17.0000 g | Freq: Every day | ORAL | Status: DC

## 2023-11-29 MED ORDER — ENSURE PLUS HIGH PROTEIN PO LIQD
237.0000 mL | Freq: Three times a day (TID) | ORAL | Status: DC
  Administered 2023-11-30 – 2023-12-03 (×8): 237 mL via ORAL

## 2023-11-29 MED ORDER — ENOXAPARIN SODIUM 40 MG/0.4ML IJ SOSY
40.0000 mg | PREFILLED_SYRINGE | INTRAMUSCULAR | Status: DC
  Administered 2023-11-29 – 2023-12-02 (×4): 40 mg via SUBCUTANEOUS
  Filled 2023-11-29 (×4): qty 0.4

## 2023-11-29 MED ORDER — SODIUM CHLORIDE 0.9 % IV SOLN
2.0000 g | INTRAVENOUS | Status: DC
  Administered 2023-11-30 – 2023-12-03 (×4): 2 g via INTRAVENOUS
  Filled 2023-11-29 (×4): qty 20

## 2023-11-29 MED ORDER — BUDESON-GLYCOPYRROL-FORMOTEROL 160-9-4.8 MCG/ACT IN AERO
2.0000 | INHALATION_SPRAY | Freq: Two times a day (BID) | RESPIRATORY_TRACT | Status: DC
  Administered 2023-11-29 – 2023-11-30 (×2): 2 via RESPIRATORY_TRACT
  Filled 2023-11-29: qty 5.9

## 2023-11-29 NOTE — H&P (Addendum)
 History and Physical    Courtney Zuniga FMW:[id number] DOB: [birth year] DOA: 11/29/2023  PCP: Myra Geni ORN, FNP   Patient coming from: Home  I have personally briefly reviewed patient's old medical records in [facility]  Chief Complaint: Difficulty breathing, cough  HPI: Courtney Zuniga is a 73 y.o. female with medical history significant for chronic respiratory failure on 4 L, stage IV lung cancer, aortic stenosis, COPD, hypertension, coronary artery disease. Patient presented to the ED with complaints of difficulty breathing and cough that began yesterday.  No chest pain.  Reports swelling to her feet only.  Recently hospitalized 7/12 to 7/18-chest x-ray findings suspicious for pneumonia left lower lung.  She was managed for acute on chronic respiratory failure secondary to COPD exacerbation and pneumonia.  Treated with IV ceftriaxone  and azithromycin .  She was discharged home with outpatient hospice.  ED Course: T-max-99.5.  Heart rate 102-105.  Respiratory 22.  Blood pressure systolic 96-116.  O2 sats 94 to 100% on 4 L. Lactic acid 2.2 >> 1.8. WBC 9.5. Troponin 24 >> 21. CTA chest- Bronchial wall thickening with multifocal mucous plugging and associated airspace disease in the bilateral lower lobes, concerning for infection. Stable spiculated opacity in the right upper lobe and irregular opacities in the right lung. IV ceftriaxone  and azithromycin  started.  500 mL bolus given.  Solu-Medrol  80 mg x 1 given. Hospitalist to admit for pneumonia.  Review of Systems: As per HPI all other systems reviewed and negative.  Past Medical History:  Diagnosis Date   Anemia    as a young woman   Arthritis    osteoartritis   Asthma    Brain tumor (benign) (HCC) 2005 [facility]   Benign   Chronic headaches    Chronic hip pain    Chronic pain    COPD (chronic obstructive pulmonary disease) (HCC)    Coronary artery disease    Depression    Depression 05/15/2016   Encounter for  antineoplastic chemotherapy 01/10/2016   GERD (gastroesophageal reflux disease)    Hypertension    Lung cancer (HCC) dx'd 01/2016   currently on chemo and radiation    NSTEMI (non-ST elevated myocardial infarction) (HCC) yrs ago   On home O2    qhs 2 liters at hs and prn   Pneumonia last time 2 yrs ago   Shortness of breath dyspnea    with activity    Past Surgical History:  Procedure Laterality Date   CHOLECYSTECTOMY     COLONOSCOPY  2015   Results requested from [facility]   COLONOSCOPY     ESOPHAGOGASTRODUODENOSCOPY N/A 08/14/2015   Procedure: ESOPHAGOGASTRODUODENOSCOPY (EGD);  Surgeon: Lamar CHRISTELLA Hollingshead, MD;  Location: [facility];  Service: Endoscopy;  Laterality: N/A;  215    ESOPHAGOGASTRODUODENOSCOPY (EGD) WITH PROPOFOL  N/A 09/13/2015   Procedure: ESOPHAGOGASTRODUODENOSCOPY (EGD) WITH PROPOFOL ;  Surgeon: Toribio SHAUNNA Cedar, MD;  Location: [facility];  Service: Endoscopy;  Laterality: N/A;   ESOPHAGOGASTRODUODENOSCOPY (EGD) WITH PROPOFOL  N/A 05/19/2022   Procedure: ESOPHAGOGASTRODUODENOSCOPY (EGD) WITH PROPOFOL ;  Surgeon: Cindie Carlin POUR, DO;  Location: [facility];  Service: Endoscopy;  Laterality: N/A;   EUS N/A 03/12/2017   Procedure: UPPER ENDOSCOPIC ULTRASOUND (EUS) RADIAL;  Surgeon: Cedar Toribio SHAUNNA, MD;  Location: [facility];  Service: Endoscopy;  Laterality: N/A;   HOT HEMOSTASIS  05/19/2022   Procedure: HOT HEMOSTASIS (ARGON PLASMA COAGULATION/BICAP);  Surgeon: Cindie Carlin POUR, DO;  Location: [facility];  Service: Endoscopy;;   IR IMAGING GUIDED PORT INSERTION  01/20/2022   TUMOR REMOVAL  2005   Benign   UPPER ESOPHAGEAL ENDOSCOPIC ULTRASOUND (EUS)  09/13/2015   Procedure: UPPER ESOPHAGEAL ENDOSCOPIC ULTRASOUND (EUS);  Surgeon: Toribio SHAUNNA Cedar, MD;  Location: [facility];  Service: Endoscopy;;   VIDEO BRONCHOSCOPY WITH ENDOBRONCHIAL NAVIGATION N/A 12/31/2015   Procedure: VIDEO BRONCHOSCOPY WITH ENDOBRONCHIAL NAVIGATION;  Surgeon: Elspeth JAYSON Millers, MD;   Location: [facility] OR;  Service: Thoracic;  Laterality: N/A;   VIDEO BRONCHOSCOPY WITH ENDOBRONCHIAL ULTRASOUND N/A 11/08/2015   Procedure: VIDEO BRONCHOSCOPY WITH ENDOBRONCHIAL ULTRASOUND;  Surgeon: Maude Fleeta Ochoa, MD;  Location: [facility] OR;  Service: Thoracic;  Laterality: N/A;     reports that she quit smoking about 5 years ago. Her smoking use included cigarettes. She started smoking about 43 years ago. She has a 38 pack-year smoking history. She has never used smokeless tobacco. She reports that she does not drink alcohol and does not use drugs.  Allergies  Allergen Reactions   Desyrel  [Trazodone ] Other (See Comments)    Made me faint    Family History  Problem Relation Age of Onset   Ovarian cancer Mother    Lung cancer Father    Brain cancer Sister        Half sister   Lung cancer Brother    Lung cancer Brother    Prostate cancer Brother    Colon cancer Neg Hx    Colon polyps Neg Hx    Prior to Admission medications   Medication Sig Start Date End Date Taking? Authorizing Provider  albuterol  (PROVENTIL ) (2.5 MG/3ML) 0.083% nebulizer solution Take 2.5 mg by nebulization 3 (three) times daily as needed.   Yes [provider]  Aspirin -Salicylamide-Caffeine  (BC HEADACHE POWDER PO) Take 1 packet by mouth as needed (headache).   Yes [provider]  benzonatate (TESSALON) 100 MG capsule Take 100 mg by mouth 3 (three) times daily as needed for cough. 05/22/23  Yes [provider]  bisoprolol  (ZEBETA ) 5 MG tablet Take 0.5 tablets (2.5 mg total) by mouth daily. 09/22/23  Yes Johnson, Clanford L, MD  dexamethasone  (DECADRON ) 4 MG tablet Take 4 mg by mouth every morning. 11/07/23  Yes [provider]  docusate sodium  (COLACE) 100 MG capsule Take 1 capsule (100 mg total) by mouth 2 (two) times daily. 08/12/23 08/11/24 Yes Maree, Pratik D, DO  ferrous sulfate  325 (65 FE) MG tablet Take 1 tablet (325 mg total) by mouth daily with breakfast. 08/13/23 11/29/23 Yes Shah,  Pratik D, DO  Fluticasone -Umeclidin-Vilant (TRELEGY ELLIPTA ) 100-62.5-25 MCG/ACT AEPB INHALE 1 PUFF BY MOUTH EVERY DAY   Yes [provider]  furosemide  (LASIX ) 20 MG tablet Take 1 tablet (20 mg total) by mouth every Monday, Wednesday, and Friday. 09/23/23  Yes Johnson, Clanford L, MD  Ipratropium-Albuterol  (COMBIVENT  RESPIMAT) 20-100 MCG/ACT AERS respimat Inhale 1 puff into the lungs every 6 (six) hours as needed for wheezing or shortness of breath. 08/28/22  Yes Brycelynn Stampley, Courage, MD  megestrol  (MEGACE ) 40 MG/ML suspension Take 400 mg by mouth 3 (three) times daily. 10/01/23  Yes [provider]  oxyCODONE -acetaminophen  (PERCOCET/ROXICET) 5-325 MG tablet Take 1 tablet by mouth every 6 (six) hours as needed for moderate pain (pain score 4-6) or severe pain (pain score 7-10). 11/02/23  Yes [provider]  pantoprazole  (PROTONIX ) 40 MG tablet Take 1 tablet (40 mg total) by mouth 2 (two) times daily. 10/29/23 11/29/23 Yes Shahmehdi, Adriana LABOR, MD  Polyethylene Glycol 3350  (PEG 3350 ) 17 GM/SCOOP POWD Take 17 g by mouth daily.  10/21/23  Yes [provider]  traZODone  (DESYREL ) 50 MG tablet Take 1 tablet (50 mg total) by mouth at bedtime. 08/28/22  Yes Pearlean Manus, MD  VENTOLIN  HFA 108 (90 Base) MCG/ACT inhaler Inhale 2 puffs into the lungs every 4 (four) hours as needed.   Yes [provider]  ENSURE (ENSURE) Take 237 mLs by mouth 3 (three) times daily between meals.    [provider]  predniSONE  (DELTASONE ) 10 MG tablet Take 4 PO QAM x 7 days, 2 PO QAM x 7 days, 1 PO QAM x 7 days Patient not taking: Reported on 11/29/2023 10/31/23   Vicci Afton CROME, MD  HYDROcodone  bit-homatropine (HYCODAN) 5-1.5 MG/5ML syrup Take 5 mLs by mouth every 6 (six) hours as needed for cough. 11/10/21   Sherrod Sherrod, MD    Physical Exam: Vitals:   11/29/23 1608 11/29/23 1654 11/29/23 1957 11/29/23 2033  BP:   116/72 96/79  Pulse:   (!) 102 (!) 105  Resp:    (!) 22   Temp:   98.1 F (36.7 C) 98.5 F (36.9 C)  TempSrc:   Oral Oral  SpO2: 100% 100% 97% 94%    Constitutional: Acutely and chronically ill-appearing.  Sitting up in bed, moderate increased work of breathing after ambulating to the bathroom Vitals:   11/29/23 1608 11/29/23 1654 11/29/23 1957 11/29/23 2033  BP:   116/72 96/79  Pulse:   (!) 102 (!) 105  Resp:    (!) 22  Temp:   98.1 F (36.7 C) 98.5 F (36.9 C)  TempSrc:   Oral Oral  SpO2: 100% 100% 97% 94%   Eyes: PERRL, lids and conjunctivae normal ENMT: Mucous membranes are moist. Neck: normal, supple, no masses, no thyromegaly Respiratory: Reduced air entry bilaterally with faint bilateral wheezing, moderate increased work of breathing, with accessory muscle use, tachypneic maintaining sats above 94% on 4 L. Cardiovascular: Tachycardic, regular rate and rhythm, no murmurs / rubs / gallops.  Swelling to bilateral feet only, not extending past the ankles.  Abdomen: no tenderness, no masses palpated. No hepatosplenomegaly. Bowel sounds positive.  Musculoskeletal: no clubbing / cyanosis. No joint deformity upper and lower extremities.   Skin: no rashes, lesions, ulcers. No induration Neurologic: No facial asymmetry, moving extremities spontaneously, speech fluent Psychiatric: Normal judgment and insight. Alert and oriented x 3. Normal mood.   Labs on Admission: I have personally reviewed following labs and imaging studies  CBC: Recent Labs  Lab 11/29/23 1551  WBC 9.5  NEUTROABS 8.8*  HGB 12.4  HCT 40.7  MCV 86.2  PLT 198   Basic Metabolic Panel: Recent Labs  Lab 11/29/23 1551  NA 138  K 3.7  CL 94*  CO2 34*  GLUCOSE 76  BUN 20  CREATININE 0.56  CALCIUM  8.6*   GFR: CrCl cannot be calculated (Unknown ideal weight.). Liver Function Tests: Recent Labs  Lab 11/29/23 1551  AST 19  ALT 24  ALKPHOS 117  BILITOT 0.2  PROT 6.2*  ALBUMIN  3.4*   Radiological Exams on Admission: CT Angio Chest PE W/Cm &/Or Wo  Cm Result Date: 11/29/2023 CLINICAL DATA:  Pulmonary embolism suspected, high probability. History of COPD with shortness of breath. EXAM: CT ANGIOGRAPHY CHEST WITH CONTRAST TECHNIQUE: Multidetector CT imaging of the chest was performed using the standard protocol during bolus administration of intravenous contrast. Multiplanar CT image reconstructions and MIPs were obtained to evaluate the vascular anatomy. RADIATION DOSE REDUCTION: This exam was performed according to the departmental dose-optimization program which  includes automated exposure control, adjustment of the mA and/or kV according to patient size and/or use of iterative reconstruction technique. CONTRAST:  75mL OMNIPAQUE  IOHEXOL  350 MG/ML SOLN COMPARISON:  11/18/2023. FINDINGS: Cardiovascular: The heart is borderline enlarged and there is no pericardial effusion. Multi-vessel coronary artery calcifications are noted. There is atherosclerotic calcification of the aorta without evidence of aneurysm. The pulmonary trunk is distended suggesting underlying pulmonary artery hypertension. There is no evidence of pulmonary embolism. A right chest port terminates in the right atrium. Mediastinum/Nodes: No mediastinal, hilar, or axillary lymphadenopathy. The thyroid  gland and trachea are within normal limits. Debris is noted in the mid esophagus. Lungs/Pleura: Advanced centrilobular and paraseptal emphysematous changes are noted in the lungs. There is bronchial wall thickening bilaterally with multifocal mucous plugging in the bilateral lower lobes with patchy airspace disease. A stable irregular opacity is noted at the right lung apex measuring 1.4 x 1.3 cm. Stable atelectasis, consolidation, or scarring is noted in the perihilar region on the right and right lower lobe. The previously described left apical 6 mm nodule is not well seen on this exam. There are trace bilateral pleural effusions. No pneumothorax is seen. Upper Abdomen: No acute abnormality.  Musculoskeletal: Degenerative changes are present in the thoracic spine. No acute osseous abnormality is seen. Review of the MIP images confirms the above findings. IMPRESSION: 1. No evidence of pulmonary embolism. 2. Bronchial wall thickening with multifocal mucous plugging and associated airspace disease in the bilateral lower lobes, concerning for infection. 3. Trace bilateral pleural effusions. 4. Stable spiculated opacity in the right upper lobe and irregular opacities in the right lung. 5. Emphysema. 6. Coronary artery calcifications. 7. Aortic atherosclerosis. Electronically Signed   By: Leita Birmingham M.D.   On: 11/29/2023 17:54   DG Chest Port 1 View Result Date: 11/29/2023 CLINICAL DATA:  Dyspnea. EXAM: PORTABLE CHEST 1 VIEW COMPARISON:  Most recent radiograph 10/26/2023, CT 11/18/2023 FINDINGS: Right chest port in place. Linear metallic density measuring 5.9 cm projecting over the aortic arch is presumably external to the patient. Right chest port remains in place. The lungs are hyperinflated with emphysema. Bandlike right perihilar opacities not significantly changed from recent CT. The right apical nodule on CT is not well seen by radiograph. Bandlike left lower lobe opacity is similar. No evidence of acute airspace disease, pneumothorax, pulmonary edema or pleural effusion. IMPRESSION: 1. Hyperinflation and emphysema. 2. Chronic changes of bandlike right perihilar and left lower lobe opacities, similar to recent CT. 3. Linear metallic density projecting over the aortic arch is presumably external to the patient. Recommend correlation with physical exam. Electronically Signed   By: Andrea Gasman M.D.   On: 11/29/2023 16:10   EKG: Independently reviewed.  Sinus rhythm, rate 97, QTc 455.  No significant change from prior.  Assessment/Plan Principal Problem:   PNA (pneumonia) Active Problems:   Severe sepsis (HCC)   COPD exacerbation (HCC)   Essential hypertension   Adenocarcinoma of right  lung, stage 4   Chronic respiratory failure with hypoxia (HCC)   (HFpEF) heart failure with preserved ejection fraction (HCC)   Assessment and Plan: * PNA (pneumonia) Meeting severe sepsis criteria with tachycardia- heart rate 102-105, tachypnea respirate rate of 22.  With evidence of endorgan dysfunction, lactic acidosis of 2.2  > 1.8.   T. max 99.5.  WBC -9.5.  CTA chest shows bilateral lower lobe airspace disease concerning for infection and multifocal mucous plugging.  Hospitalization for same a month ago, treated with IV ceftriaxone  and azithromycin . -  Continue IV ceftriaxone  and azithromycin  - 1 L bolus, continue LR 100cc/hr x 15hrs  COPD exacerbation (HCC) Presented with dyspnea, cough, reduced air entry and wheezing on exam.  Likely exacerbated by pneumonia.  No increased O2 demands. -DuoNebs as needed and scheduled - Mucolytics - IV Solu-Medrol  80 mg given, continue 60 twice daily -IV antibiotics for pneumonia  Essential hypertension Hold bisoprolol  with low blood pressure  Adenocarcinoma of right lung, stage 4 Follows with Dr. Gatha, last note on file 03/2023.  She has received radiation, chemotherapy and was on immunotherapy.  Currently under observation. - Megace , chronic pain meds. - Hold 4 mg daily dexamethasone  while on Solu-Medrol    (HFpEF) heart failure with preserved ejection fraction (HCC) Stable and compensated.  Swelling to bilateral feet only.  Last echo 04/2022 EF 65 to 70%.  Per MAR she is on Lasix  20 mg 3 times a week. - Blood pressure currently 90s, hold Lasix   Chronic respiratory failure with hypoxia (HCC) On 4 L.  Stable.  DVT prophylaxis: Lovenox  Code Status: FULL Code-confirmed with patient at bedside.  Previous CODE STATUS states different.  But patient tells me she wants to be full code.  ACP Documents reviewed. Family Communication: None at bedside Disposition Plan: >/~ 2 days Consults called: None  Admission status:  Inpt tele I certify that  at the point of admission it is my clinical judgment that the patient will require inpatient hospital care spanning beyond 2 midnights from the point of admission due to high intensity of service, high risk for further deterioration and high frequency of surveillance required.    Author: Tully FORBES Carwin, MD 11/29/2023 9:18 PM  For on call review [email or web address].

## 2023-11-29 NOTE — Assessment & Plan Note (Signed)
 Hold bisoprolol  with low blood pressure

## 2023-11-29 NOTE — Assessment & Plan Note (Addendum)
 Follows with Dr. Gatha, last note on file 03/2023.  She has received radiation, chemotherapy and was on immunotherapy.  Currently under observation. - Megace , chronic pain meds. - Hold 4 mg daily dexamethasone  while on Solu-Medrol 

## 2023-11-29 NOTE — Progress Notes (Signed)
 Patient arrived to this unit and has been admitted to room 327. Pt arrived via w/c with x1 assistance. Pt was able to get up from w/c and ambulate to the bed and put herself in the bed independently. Pt gets very SOB with exertion and pts HR increases. Pt is currently on 4LPM via nasal cannula of O2 which per pt is her baseline. Pt educated on breathing techniques and how to properly take deep breaths using the method of slow deep breaths in through the nose while breathing out through her mouth. O2 sat 93%. Pt was placed on Telemetry box 22 and telemetry tech was called with a 2nd witness to verify as pt was placed on Telemetry monitoring. HR 105 with sinus tach. Pt reports her HR runs tachy at baseline. Denies pain. Resting in bed with call light within reach.

## 2023-11-29 NOTE — Assessment & Plan Note (Signed)
 Presented with dyspnea, cough, reduced air entry and wheezing on exam.  Likely exacerbated by pneumonia.  No increased O2 demands. -DuoNebs as needed and scheduled - Mucolytics - IV Solu-Medrol  80 mg given, continue 60 twice daily -IV antibiotics for pneumonia

## 2023-11-29 NOTE — Sepsis Progress Note (Signed)
 Sepsis protocol is being followed by [facility].

## 2023-11-29 NOTE — ED Provider Notes (Signed)
[place] EMERGENCY DEPARTMENT AT [facility] Provider Note   CSN: [id number] Arrival date & time: 11/29/23  1512     Patient presents with: Shortness of Breath   Courtney Zuniga is a 73 y.o. female.   Patient is a 73 year old female with a past medical history of cancer, COPD, CHF who presents emergency department the chief complaint of increased shortness of breath, cough, chills over the past 24 hours.  Patient notes that she is chronically on 4 L nasal cannula of oxygen .  She denies any associated abdominal pain, nausea, vomiting or diarrhea.  She does admit to some increased edema in bilateral lower extremities.  She has had no associated dizziness, lightheadedness or syncope.  She notes that she is currently undergoing treatment for her lung cancer at this time.  She denies any associated chest pain.   Shortness of Breath Associated symptoms: cough        Prior to Admission medications   Medication Sig Start Date End Date Taking? Authorizing Provider  Aspirin -Salicylamide-Caffeine  (BC HEADACHE POWDER PO) Take 1 packet by mouth as needed (headache).    [provider]  benzonatate (TESSALON) 100 MG capsule Take 100 mg by mouth 3 (three) times daily as needed for cough. 05/22/23   [provider]  bisoprolol  (ZEBETA ) 5 MG tablet Take 0.5 tablets (2.5 mg total) by mouth daily. 09/22/23   Johnson, Clanford L, MD  docusate sodium  (COLACE) 100 MG capsule Take 1 capsule (100 mg total) by mouth 2 (two) times daily. 08/12/23 08/11/24  Maree, Pratik D, DO  ENSURE (ENSURE) Take 237 mLs by mouth 3 (three) times daily between meals.    [provider]  ferrous sulfate  325 (65 FE) MG tablet Take 1 tablet (325 mg total) by mouth daily with breakfast. 08/13/23 09/18/23  Maree, Pratik D, DO  Fluticasone -Umeclidin-Vilant (TRELEGY ELLIPTA ) 100-62.5-25 MCG/ACT AEPB INHALE 1 PUFF BY MOUTH EVERY DAY    [provider]  furosemide  (LASIX ) 20 MG tablet Take 1 tablet  (20 mg total) by mouth every Monday, Wednesday, and Friday. 09/23/23   Johnson, Clanford L, MD  Ipratropium-Albuterol  (COMBIVENT  RESPIMAT) 20-100 MCG/ACT AERS respimat Inhale 1 puff into the lungs every 6 (six) hours as needed for wheezing or shortness of breath. 08/28/22   Pearlean Manus, MD  megestrol  (MEGACE ) 40 MG/ML suspension Take 400 mg by mouth 3 (three) times daily. 10/01/23   [provider]  pantoprazole  (PROTONIX ) 40 MG tablet Take 1 tablet (40 mg total) by mouth 2 (two) times daily. 10/29/23 11/28/23  Willette Adriana LABOR, MD  Polyethylene Glycol 3350  (PEG 3350 ) 17 GM/SCOOP POWD Take 17 g by mouth daily. 10/21/23   [provider]  predniSONE  (DELTASONE ) 10 MG tablet Take 4 PO QAM x 7 days, 2 PO QAM x 7 days, 1 PO QAM x 7 days 10/31/23   Vicci, Clanford L, MD  traZODone  (DESYREL ) 50 MG tablet Take 1 tablet (50 mg total) by mouth at bedtime. 08/28/22   Pearlean Manus, MD  HYDROcodone  bit-homatropine (HYCODAN) 5-1.5 MG/5ML syrup Take 5 mLs by mouth every 6 (six) hours as needed for cough. 11/10/21   Sherrod Sherrod, MD    Allergies: Desyrel  [trazodone ]    Review of Systems  Constitutional:  Positive for chills.  Respiratory:  Positive for cough and shortness of breath.     Updated Vital Signs BP 106/71   Pulse (!) 105   Temp 99.5 F (37.5 C) (Oral)   SpO2 99%   Physical Exam Vitals and  nursing note reviewed.  Constitutional:      General: She is not in acute distress.    Appearance: Normal appearance. She is not ill-appearing.  HENT:     Head: Normocephalic and atraumatic.     Nose: Nose normal.     Mouth/Throat:     Mouth: Mucous membranes are moist.  Eyes:     Extraocular Movements: Extraocular movements intact.     Conjunctiva/sclera: Conjunctivae normal.     Pupils: Pupils are equal, round, and reactive to light.  Cardiovascular:     Rate and Rhythm: Regular rhythm. Tachycardia present.     Pulses: Normal pulses.     Heart sounds: Normal heart  sounds. No murmur heard. Pulmonary:     Effort: Pulmonary effort is normal. Tachypnea present. No respiratory distress.     Breath sounds: Wheezing present. No decreased breath sounds, rhonchi or rales.  Chest:     Chest wall: No tenderness.  Abdominal:     General: Abdomen is flat. Bowel sounds are normal.     Palpations: Abdomen is soft.  Musculoskeletal:        General: Normal range of motion.     Cervical back: Normal range of motion and neck supple.     Right lower leg: Edema present.     Left lower leg: Edema present.  Skin:    General: Skin is warm and dry.     Findings: No ecchymosis or rash.  Neurological:     General: No focal deficit present.     Mental Status: She is alert and oriented to person, place, and time. Mental status is at baseline.     Cranial Nerves: No cranial nerve deficit.     Motor: No weakness.  Psychiatric:        Mood and Affect: Mood normal.        Behavior: Behavior normal.        Thought Content: Thought content normal.        Judgment: Judgment normal.     (all labs ordered are listed, but only abnormal results are displayed) Labs Reviewed  CULTURE, BLOOD (ROUTINE X 2)  CULTURE, BLOOD (ROUTINE X 2)  RESP PANEL BY RT-PCR (RSV, FLU A&B, COVID)  [id number]  COMPREHENSIVE METABOLIC PANEL WITH GFR  LACTIC ACID, PLASMA  LACTIC ACID, PLASMA  CBC WITH DIFFERENTIAL/PLATELET  URINALYSIS, ROUTINE W REFLEX MICROSCOPIC  BRAIN NATRIURETIC PEPTIDE  TROPONIN I (HIGH SENSITIVITY)    EKG: None  Radiology: No results found.   .Critical Care  Performed by: Daralene Lonni BIRCH, PA-C Authorized by: Daralene Lonni BIRCH, PA-C   Critical care provider statement:    Critical care time (minutes):  35   Critical care was necessary to treat or prevent imminent or life-threatening deterioration of the following conditions:  Sepsis   Critical care was time spent personally by me on the following activities:  Development of treatment plan with patient or  surrogate, discussions with consultants, evaluation of patient's response to treatment, examination of patient, ordering and review of laboratory studies, ordering and review of radiographic studies, ordering and performing treatments and interventions, pulse oximetry, re-evaluation of patient's condition and review of old charts   I assumed direction of critical care for this patient from another provider in my specialty: no     Care discussed with: admitting provider      Medications Ordered in the ED  cefTRIAXone  (ROCEPHIN ) 2 g in sodium chloride  0.9 % 100 mL IVPB (has no administration in time range)  azithromycin  (ZITHROMAX ) 500 mg in sodium chloride  0.9 % 250 mL IVPB (has no administration in time range)  ipratropium-albuterol  (DUONEB) 0.5-2.5 (3) MG/3ML nebulizer solution 3 mL (has no administration in time range)  ipratropium-albuterol  (DUONEB) 0.5-2.5 (3) MG/3ML nebulizer solution 3 mL (has no administration in time range)  methylPREDNISolone  sodium succinate (SOLU-MEDROL ) 125 mg/2 mL injection 80 mg (has no administration in time range)                                    Medical Decision Making Amount and/or Complexity of Data Reviewed Labs: ordered. Radiology: ordered.  Risk Prescription drug management. Decision regarding hospitalization.   This patient presents to the ED for concern of shortness of breath, cough, congestion, generalized weakness, this involves an extensive number of treatment options, and is a complaint that carries with it a high risk of complications and morbidity.  The differential diagnosis includes COPD exacerbation, acute CHF, pneumonia, pulmonary embolus, ACS, peritonitis, myocarditis   Co morbidities that complicate the patient evaluation  COPD, lung cancer   Additional history obtained:  Additional history obtained from medical records External records from outside source obtained and reviewed including medical records   Lab Tests:  I  Ordered, and personally interpreted labs.  The pertinent results include: No leukocytosis, no anemia, normal kidney function liver function, normal electrolytes, stable serial troponins, elevated BNP, downtrending lactic acid, negative viral swab   Imaging Studies ordered:  I ordered imaging studies including CTA chest, chest x-ray I independently visualized and interpreted imaging which showed no pulmonary embolus, bilateral lower infiltrates I agree with the radiologist interpretation   Cardiac Monitoring: / EKG:  The patient was maintained on a cardiac monitor.  I personally viewed and interpreted the cardiac monitored which showed an underlying rhythm of: Normal sinus rhythm, no ST changes, nonspecific T wave changes, no STEMI   Consultations Obtained:  I requested consultation with the hospitalist,  and discussed lab and imaging findings as well as pertinent plan - they recommend: Admission   Problem List / ED Course / Critical interventions / Medication management  Patient is doing well at this time and does remain stable.  Discussed with patient we will plan for admission to the hospital service given her apparent sepsis, pneumonia.  She has been treated with IV fluids as well as antibiotics.  BNP is at baseline.  She does have stable serial troponins.  She is maintaining her O2 saturations on her home nasal cannula.  Viral swab was negative.  Patient does note that symptoms have improved with treatment in the emergency department.  CTA of the chest demonstrated no indication for pulmonary embolus.  Have discussed patient case with Dr. FORBES Carwin with the hospital service who has excepted for admission.  Judicious fluid resuscitation has been utilized in the care of this patient to avoid fluid overload. I ordered medication including Rocephin , azithromycin , IV fluids, DuoNeb, Solu-Medrol  for pneumonia, sepsis, COPD Reevaluation of the patient after these medicines showed that the  patient improved I have reviewed the patients home medicines and have made adjustments as needed   Social Determinants of Health:  None   Test / Admission - Considered:  Admission     Final diagnoses:  None    ED Discharge Orders     None          Daralene Lonni JONETTA DEVONNA 11/29/23 2018    Suzette Pac, MD 12/02/23  1030  

## 2023-11-29 NOTE — ED Triage Notes (Signed)
 BIB [facility]. Pt with hx of COPD complaining of SOB starting around 0930 this morning. Tried her inhaler with no relief. Pt normally on 4 L at home. EMS stated pt was 93% on her 4 L but with obvious labored breathing with pursed lips.

## 2023-11-29 NOTE — Assessment & Plan Note (Addendum)
 Meeting severe sepsis criteria with tachycardia- heart rate 102-105, tachypnea respirate rate of 22.  With evidence of endorgan dysfunction, lactic acidosis of 2.2  > 1.8.   T. max 99.5.  WBC -9.5.  CTA chest shows bilateral lower lobe airspace disease concerning for infection and multifocal mucous plugging.  Hospitalization for same a month ago, treated with IV ceftriaxone  and azithromycin . - Continue IV ceftriaxone  and azithromycin  - 1 L bolus, continue LR 100cc/hr x 15hrs

## 2023-11-29 NOTE — Progress Notes (Signed)
 RN in ED overseeing patients care called this Clinical research associate and gave a verbal report via telephone prior to the patient coming to this unit to be admitted to room 327. No questions at this time. Awaiting on patient to arrive from ED.

## 2023-11-29 NOTE — Assessment & Plan Note (Signed)
On 4 L.  Stable.

## 2023-11-29 NOTE — Assessment & Plan Note (Addendum)
 Stable and compensated.  Swelling to bilateral feet only.  Last echo 04/2022 EF 65 to 70%.  Per MAR she is on Lasix  20 mg 3 times a week. - Blood pressure currently 90s, hold Lasix 

## 2023-11-30 ENCOUNTER — Inpatient Hospital Stay (HOSPITAL_COMMUNITY)

## 2023-11-30 ENCOUNTER — Other Ambulatory Visit (HOSPITAL_COMMUNITY): Payer: Self-pay | Admitting: *Deleted

## 2023-11-30 ENCOUNTER — Other Ambulatory Visit: Payer: Self-pay

## 2023-11-30 ENCOUNTER — Encounter (HOSPITAL_COMMUNITY): Payer: Self-pay | Admitting: Internal Medicine

## 2023-11-30 DIAGNOSIS — I517 Cardiomegaly: Secondary | ICD-10-CM | POA: Diagnosis not present

## 2023-11-30 DIAGNOSIS — I5030 Unspecified diastolic (congestive) heart failure: Secondary | ICD-10-CM | POA: Diagnosis not present

## 2023-11-30 DIAGNOSIS — J9611 Chronic respiratory failure with hypoxia: Secondary | ICD-10-CM

## 2023-11-30 DIAGNOSIS — R7989 Other specified abnormal findings of blood chemistry: Secondary | ICD-10-CM | POA: Diagnosis not present

## 2023-11-30 DIAGNOSIS — J189 Pneumonia, unspecified organism: Secondary | ICD-10-CM | POA: Diagnosis not present

## 2023-11-30 DIAGNOSIS — I088 Other rheumatic multiple valve diseases: Secondary | ICD-10-CM | POA: Diagnosis not present

## 2023-11-30 LAB — CBC
HCT: 36.7 % (ref 36.0–46.0)
Hemoglobin: 11.4 g/dL — ABNORMAL LOW (ref 12.0–15.0)
MCH: 26.5 pg (ref 26.0–34.0)
MCHC: 31.1 g/dL (ref 30.0–36.0)
MCV: 85.3 fL (ref 80.0–100.0)
Platelets: 196 K/uL (ref 150–400)
RBC: 4.3 MIL/uL (ref 3.87–5.11)
RDW: 23.1 % — ABNORMAL HIGH (ref 11.5–15.5)
WBC: 7.6 K/uL (ref 4.0–10.5)
nRBC: 0 % (ref 0.0–0.2)

## 2023-11-30 LAB — BASIC METABOLIC PANEL WITH GFR
Anion gap: 7 (ref 5–15)
BUN: 19 mg/dL (ref 8–23)
CO2: 32 mmol/L (ref 22–32)
Calcium: 8.7 mg/dL — ABNORMAL LOW (ref 8.9–10.3)
Chloride: 100 mmol/L (ref 98–111)
Creatinine, Ser: 0.41 mg/dL — ABNORMAL LOW (ref 0.44–1.00)
GFR, Estimated: >60 mL/min (ref >60)
Glucose, Bld: 87 mg/dL (ref 70–99)
Potassium: 4.5 mmol/L (ref 3.5–5.1)
Sodium: 139 mmol/L (ref 135–145)

## 2023-11-30 MED ORDER — BUDESONIDE 0.5 MG/2ML IN SUSP
0.5000 mg | Freq: Two times a day (BID) | RESPIRATORY_TRACT | Status: DC
  Administered 2023-11-30 – 2023-12-03 (×6): 0.5 mg via RESPIRATORY_TRACT
  Filled 2023-11-30 (×6): qty 2

## 2023-11-30 MED ORDER — GUAIFENESIN 100 MG/5ML PO LIQD
10.0000 mL | Freq: Four times a day (QID) | ORAL | Status: DC
  Administered 2023-11-30 – 2023-12-03 (×12): 10 mL via ORAL
  Filled 2023-11-30 (×12): qty 10

## 2023-11-30 MED ORDER — METHYLPREDNISOLONE SODIUM SUCC 40 MG IJ SOLR
40.0000 mg | Freq: Three times a day (TID) | INTRAMUSCULAR | Status: DC
  Administered 2023-11-30 – 2023-12-02 (×6): 40 mg via INTRAVENOUS
  Filled 2023-11-30 (×6): qty 1

## 2023-11-30 MED ORDER — IPRATROPIUM-ALBUTEROL 0.5-2.5 (3) MG/3ML IN SOLN: 3.0000 mL | Freq: Three times a day (TID) | RESPIRATORY_TRACT | Status: DC

## 2023-11-30 MED ORDER — IPRATROPIUM-ALBUTEROL 0.5-2.5 (3) MG/3ML IN SOLN
3.0000 mL | Freq: Three times a day (TID) | RESPIRATORY_TRACT | Status: DC
  Administered 2023-11-30 – 2023-12-03 (×10): 3 mL via RESPIRATORY_TRACT
  Filled 2023-11-30 (×10): qty 3

## 2023-11-30 MED ORDER — METHYLPREDNISOLONE SODIUM SUCC 40 MG IJ SOLR: 40.0000 mg | Freq: Three times a day (TID) | INTRAMUSCULAR | Status: DC

## 2023-11-30 MED ORDER — ARFORMOTEROL TARTRATE 15 MCG/2ML IN NEBU
15.0000 ug | INHALATION_SOLUTION | Freq: Two times a day (BID) | RESPIRATORY_TRACT | Status: DC
  Administered 2023-11-30 – 2023-12-03 (×6): 15 ug via RESPIRATORY_TRACT
  Filled 2023-11-30 (×6): qty 2

## 2023-11-30 NOTE — TOC Initial Note (Signed)
 Transition of Care [facility]) - Initial/Assessment Note    Patient Details  Name: Courtney Zuniga MRN: [id number] Date of Birth: [birth year]  Transition of Care [facility]) CM/SW Contact:    Noreen KATHEE Pinal, LCSWA Phone Number: 11/30/2023, 2:05 PM  Clinical Narrative:                  Patient is at risk for readmission due to high admission score. Patient was admitted for Pneumonia. Patient reports that she lives alone , but has a supportive friend and daughter. Patient reports that she has a walker, WC, nebulizer machine and oxygen  in the home. Patient reports being on 4L at home . Patient plans to return back to home once medically stable. TOC following.    Expected Discharge Plan: Home/Self Care Barriers to Discharge: Continued Medical Work up   Patient Goals and CMS Choice Patient states their goals for this hospitalization and ongoing recovery are:: return back home CMS Medicare.gov Compare Post Acute Care list provided to:: Patient Choice offered to / list presented to : Patient      Expected Discharge Plan and Services In-house Referral: Clinical Social Work   Post Acute Care Choice: Durable Medical Equipment Living arrangements for the past 2 months: Single Family Home                                      Prior Living Arrangements/Services Living arrangements for the past 2 months: Single Family Home Lives with:: Self Patient language and need for interpreter reviewed:: Yes Do you feel safe going back to the place where you live?: Yes      Need for Family Participation in Patient Care: Yes (Comment) Care giver support system in place?: Yes (comment) Current home services: DME Criminal Activity/Legal Involvement Pertinent to Current Situation/Hospitalization: No - Comment as needed  Activities of Daily Living   ADL Screening (condition at time of admission) Independently performs ADLs?: Yes (appropriate for developmental age) Is the patient deaf or have difficulty  hearing?: No Does the patient have difficulty seeing, even when wearing glasses/contacts?: No Does the patient have difficulty concentrating, remembering, or making decisions?: No  Permission Sought/Granted      Share Information with NAME: Shakeita     Permission granted to share info w Relationship: Patient     Emotional Assessment Appearance:: Appears stated age Attitude/Demeanor/Rapport: Engaged Affect (typically observed): Appropriate Orientation: : Oriented to Self, Oriented to Place, Oriented to  Time, Oriented to Situation Alcohol / Substance Use: Not Applicable Psych Involvement: No (comment)  Admission diagnosis:  COPD exacerbation (HCC) [J44.1] PNA (pneumonia) [J18.9] Community acquired pneumonia, unspecified laterality [J18.9] Sepsis, due to unspecified organism, unspecified whether acute organ dysfunction present [facility]) [A41.9] Patient Active Problem List   Diagnosis Date Noted   PNA (pneumonia) 11/29/2023   ABLA (acute blood loss anemia) 10/27/2023   Rectal bleeding 10/25/2023   Microcytic anemia 10/25/2023   Hypoalbuminemia due to protein-calorie malnutrition (HCC) 10/25/2023   CAP (community acquired pneumonia) 10/24/2023   CO2 narcosis 09/19/2023   COPD (chronic obstructive pulmonary disease) (HCC) 09/18/2023   Chronic anemia 08/11/2023   Acute metabolic encephalopathy 08/10/2023   COPD GOLD 2 criteria 2017 08/07/2023   Chronic respiratory failure with hypoxia and hypercapnia (HCC) 08/07/2023   Lobar pneumonia (HCC) 06/03/2023   Sinus tachycardia 10/13/2022   (HFpEF) heart failure with preserved ejection fraction (HCC) 10/13/2022   Aortic regurgitation 10/13/2022   Aortic stenosis  10/13/2022   Nicotine  abuse 10/13/2022   DOE (dyspnea on exertion) 10/06/2022   Pleural effusion 08/25/2022   Hypotension 06/12/2022   Iron  deficiency anemia 06/12/2022   Acute on chronic anemia 05/20/2022   History of Gastric ulcer 05/20/2022   AVM (arteriovenous  malformation) of small bowel, 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   Upper GI bleed 05/18/2022   Acute blood loss anemia 05/18/2022   Acute respiratory failure with hypoxia (HCC) 04/23/2022   Frequent PVCs 04/09/2022   Port-A-Cath in place 03/03/2022   Increased frequency of headaches 10/18/2020   COPD exacerbation (HCC) 11/05/2019   Chronic nonintractable headache    Acute on chronic respiratory failure with hypoxia and hypercapnia (HCC) 06/16/2018   Influenza A 06/16/2018   Severe protein-calorie malnutrition (HCC) 06/16/2018   GERD (gastroesophageal reflux disease) 06/16/2018   Bilateral pneumonia 05/05/2018   Severe sepsis (HCC) 05/05/2018   Chronic respiratory failure with hypoxia (HCC) 05/05/2018   Encounter for antineoplastic immunotherapy 04/23/2017   Depression 05/15/2016   Adenocarcinoma of right lung, stage 4 01/10/2016   Encounter for antineoplastic chemotherapy 01/10/2016   Lung mass 01/03/2016   Arthritis    Subepithelial gastric mass    Mucosal abnormality of stomach    Abdominal pain 08/08/2015   Abnormal CT of the abdomen 08/08/2015   Hypoxia 06/26/2014   Community acquired bilateral lower lobe pneumonia    Chest pain of uncertain etiology    Asthma, chronic    Chest pain 02/16/2014   Essential hypertension 02/16/2014   DDD (degenerative disc disease), lumbar 02/04/2011   Lumbar herniated disc 01/09/2011   PCP:  Myra Geni ORN, FNP Pharmacy:   [facility] - [place], [place] - [address] [place] [address] Phone: [phone] Fax: [phone]  [facility] - [place], [place] - [address] [place] [address] Phone: [phone] Fax: [phone]     Social Drivers of Health (SDOH) Social History: SDOH Screenings   Food Insecurity: No Food Insecurity (11/29/2023)  Housing: Low Risk  (11/29/2023)  Transportation Needs: No Transportation Needs (11/29/2023)  Utilities: Not At Risk  (11/29/2023)  Financial Resource Strain: Low Risk  (05/05/2018)  Physical Activity: Insufficiently Active (05/05/2018)  Social Connections: Unknown (11/29/2023)  Recent Concern: Social Connections - Moderately Isolated (10/25/2023)  Stress: No Stress Concern Present (05/05/2018)  Tobacco Use: Medium Risk (11/30/2023)   SDOH Interventions:     Readmission Risk Interventions    11/30/2023    2:04 PM 10/29/2023   10:08 AM 10/26/2023   11:35 AM  Readmission Risk Prevention Plan  Transportation Screening Complete Complete Complete  HRI or Home Care Consult  Complete   Social Work Consult for Recovery Care Planning/Counseling  Complete   Palliative Care Screening  Not Applicable   Medication Review Oceanographer) Complete Complete Complete  HRI or Home Care Consult Complete  Complete  SW Recovery Care/Counseling Consult Complete  Complete  Palliative Care Screening Not Applicable  Not Applicable  Skilled Nursing Facility Not Applicable  Not Applicable

## 2023-11-30 NOTE — Progress Notes (Signed)
*  PRELIMINARY RESULTS* Echocardiogram 2D Echocardiogram has been performed.  Teresa Aida PARAS 11/30/2023, 5:32 PM

## 2023-11-30 NOTE — Progress Notes (Signed)
  Progress Note   Patient: Courtney Zuniga FMW:[id number] DOB: 1950-09-26 DOA: 11/29/2023     1 DOS: the patient was seen and examined on 11/30/2023   Assessment and Plan: PNA in the setting of chronic resp failure with hypoxia  - Continue home oxygen  of 4L - Pulmicort /brovana  bid  - Duoneb q4 hr PRN and tid standing  - IV azithromycin  500 mg daily  - IV ceftriaxone  2 g daily  - IV solumedrol 40 mg q8hr  - Robitussin 10 mL PO q6 hr   COPD - Management as above   HTN - Stable;monitor   Adenocarcinoma of R lung, stage 4  - Complicating care - Percocet 5-325 mg q6 hr PRN  - Ensure plus tid  - Megace  400 mg PO tid   HFpEF  - Stable   Subjective: Pt seen and examined at the bedside. Pulmicort /brovana , steroids and robitussin ordered today as pt has obvious wheezing b/l. IV fluids stopped as kidney function is stable and pt has hx of HFpEF.  Continue with antibx.  Physical Exam: Vitals:   11/30/23 0600 11/30/23 0722 11/30/23 0846 11/30/23 0923  BP:   104/67   Pulse:   (!) 105   Resp: 18  20   Temp:   98.4 F (36.9 C)   TempSrc:   Oral   SpO2: 95% 96% 94% 95%  Weight:      Height:       Physical Exam HENT:     Head: Normocephalic.  Cardiovascular:     Rate and Rhythm: Tachycardia present.  Pulmonary:     Breath sounds: Wheezing and rhonchi present.  Abdominal:     Palpations: Abdomen is soft.  Musculoskeletal:        General: Normal range of motion.  Neurological:     Mental Status: She is alert and oriented to person, place, and time.  Psychiatric:        Mood and Affect: Mood normal.      Disposition: Status is: Inpatient Remains inpatient appropriate because: IV antibx and breathing tx  Planned Discharge Destination: Home    Time spent: 35 minutes  Author: Yamilette Garretson , MD 11/30/2023 12:19 PM  For on call review [email or web address].

## 2023-11-30 NOTE — Plan of Care (Signed)

## 2023-12-01 DIAGNOSIS — J189 Pneumonia, unspecified organism: Secondary | ICD-10-CM | POA: Diagnosis not present

## 2023-12-01 LAB — ECHOCARDIOGRAM COMPLETE
AR max vel: 1.21 cm2
AV Area VTI: 1.15 cm2
AV Area mean vel: 1.14 cm2
AV Mean grad: 13.3 mmHg
AV Peak grad: 26.6 mmHg
Ao pk vel: 2.58 m/s
Area-P 1/2: 3.6 cm2
Height: 66 in
P 1/2 time: 243 ms
S' Lateral: 2.3 cm
Weight: 2017.65 [oz_av]

## 2023-12-01 LAB — CBC
HCT: 38.9 % (ref 36.0–46.0)
Hemoglobin: 11.8 g/dL — ABNORMAL LOW (ref 12.0–15.0)
MCH: 25.8 pg — ABNORMAL LOW (ref 26.0–34.0)
MCHC: 30.3 g/dL (ref 30.0–36.0)
MCV: 85.1 fL (ref 80.0–100.0)
Platelets: 220 K/uL (ref 150–400)
RBC: 4.57 MIL/uL (ref 3.87–5.11)
RDW: 22.6 % — ABNORMAL HIGH (ref 11.5–15.5)
WBC: 9.3 K/uL (ref 4.0–10.5)
nRBC: 0 % (ref 0.0–0.2)

## 2023-12-01 LAB — COMPREHENSIVE METABOLIC PANEL WITH GFR
ALT: 27 U/L (ref 0–44)
AST: 18 U/L (ref 15–41)
Albumin: 3.3 g/dL — ABNORMAL LOW (ref 3.5–5.0)
Alkaline Phosphatase: 111 U/L (ref 38–126)
Anion gap: 8 (ref 5–15)
BUN: 16 mg/dL (ref 8–23)
CO2: 34 mmol/L — ABNORMAL HIGH (ref 22–32)
Calcium: 9.1 mg/dL (ref 8.9–10.3)
Chloride: 100 mmol/L (ref 98–111)
Creatinine, Ser: 0.43 mg/dL — ABNORMAL LOW (ref 0.44–1.00)
GFR, Estimated: >60 mL/min (ref >60)
Glucose, Bld: 136 mg/dL — ABNORMAL HIGH (ref 70–99)
Potassium: 4.5 mmol/L (ref 3.5–5.1)
Sodium: 142 mmol/L (ref 135–145)
Total Bilirubin: 0.3 mg/dL (ref 0.0–1.2)
Total Protein: 6.1 g/dL — ABNORMAL LOW (ref 6.5–8.1)

## 2023-12-01 LAB — C-REACTIVE PROTEIN: CRP: 0.6 mg/dL (ref <1.0)

## 2023-12-01 LAB — PHOSPHORUS: Phosphorus: 3.3 mg/dL (ref 2.5–4.6)

## 2023-12-01 LAB — MAGNESIUM: Magnesium: 2 mg/dL (ref 1.7–2.4)

## 2023-12-01 NOTE — Plan of Care (Signed)
  Problem: Education: Goal: Knowledge of General Education information will improve Description: Including pain rating scale, medication(s)/side effects and non-pharmacologic comfort measures Outcome: Progressing   Problem: Health Behavior/Discharge Planning: Goal: Ability to manage health-related needs will improve Outcome: Progressing   Problem: Clinical Measurements: Goal: Ability to maintain clinical measurements within normal limits will improve Outcome: Progressing Goal: Will remain free from infection Outcome: Progressing Goal: Diagnostic test results will improve Outcome: Progressing Goal: Respiratory complications will improve Outcome: Progressing Goal: Cardiovascular complication will be avoided Outcome: Progressing   Problem: Activity: Goal: Risk for activity intolerance will decrease Outcome: Progressing   Problem: Nutrition: Goal: Adequate nutrition will be maintained Outcome: Progressing   Problem: Coping: Goal: Level of anxiety will decrease Outcome: Progressing   Problem: Elimination: Goal: Will not experience complications related to bowel motility Outcome: Progressing Goal: Will not experience complications related to urinary retention Outcome: Progressing   Problem: Pain Managment: Goal: General experience of comfort will improve and/or be controlled Outcome: Progressing   Problem: Safety: Goal: Ability to remain free from injury will improve Outcome: Progressing   Problem: Skin Integrity: Goal: Risk for impaired skin integrity will decrease Outcome: Progressing   Problem: Education: Goal: Knowledge of disease or condition will improve Outcome: Progressing Goal: Knowledge of the prescribed therapeutic regimen will improve Outcome: Progressing Goal: Individualized Educational Video(s) Outcome: Progressing   Problem: Activity: Goal: Ability to tolerate increased activity will improve Outcome: Progressing Goal: Will verbalize the  importance of balancing activity with adequate rest periods Outcome: Progressing   Problem: Respiratory: Goal: Ability to maintain a clear airway will improve Outcome: Progressing Goal: Levels of oxygenation will improve Outcome: Progressing

## 2023-12-01 NOTE — Plan of Care (Signed)
  Problem: Education: Goal: Knowledge of General Education information will improve Description: Including pain rating scale, medication(s)/side effects and non-pharmacologic comfort measures Outcome: Progressing   Problem: Nutrition: Goal: Adequate nutrition will be maintained Outcome: Progressing   Problem: Elimination: Goal: Will not experience complications related to urinary retention Outcome: Progressing   

## 2023-12-01 NOTE — Progress Notes (Signed)
  Progress Note   Patient: Courtney Zuniga FMW:[id number] DOB: March 13, 1951 DOA: 11/29/2023     2 DOS: the patient was seen and examined on 12/01/2023   Assessment and Plan:  PNA in the setting of chronic resp failure with hypoxia  - Patient's home oxygen  rate is 4L but she is now requiring 5L - Pulmicort /brovana  bid  - Duoneb q4 hr PRN and tid standing  - IV azithromycin  500 mg daily  - IV ceftriaxone  2 g daily  - IV solumedrol 40 mg q8hr  - Robitussin 10 mL PO q6 hr    COPD - Management as above    HTN - Stable;monitor    Adenocarcinoma of R lung, stage 4  - Complicating care - Percocet 5-325 mg q6 hr PRN  - Ensure plus tid  - Megace  400 mg PO tid    HFpEF  - Stable   Subjective: Pt seen and examined at the bedside. There has been improvement in her respiratory exam, however, she remains wheezing. As a result, she will remain in the hospital for continued breathing tx's and IV steroids.   Physical Exam: Vitals:   12/01/23 0637 12/01/23 0710 12/01/23 1110 12/01/23 1248  BP: 99/65  109/85   Pulse: (!) 104  (!) 109   Resp: 16     Temp: 98.4 F (36.9 C)  99.2 F (37.3 C)   TempSrc: Oral  Oral   SpO2: 100% 96% 100% 98%  Weight:      Height:       Physical Exam HENT:     Head: Normocephalic.  Cardiovascular:     Rate and Rhythm: Tachycardia present.  Pulmonary:     Breath sounds: Wheezing present.  Abdominal:     Palpations: Abdomen is soft.  Musculoskeletal:        General: Normal range of motion.  Neurological:     Mental Status: She is alert and oriented to person, place, and time.  Psychiatric:        Mood and Affect: Mood normal.      Disposition: Status is: Inpatient Remains inpatient appropriate because: Breathing tx's and IV steroids   Planned Discharge Destination: Barriers to discharge: As above    Time spent: 35 minutes  Author: Ersa Delaney , MD 12/01/2023 1:10 PM  For on call review [email or web address].

## 2023-12-01 NOTE — Progress Notes (Signed)
   12/01/23 2206  BiPAP/CPAP/SIPAP  BiPAP/CPAP/SIPAP Pt Type Adult  BiPAP/CPAP/SIPAP DREAMSTATIOND  Mask Type Full face mask  Mask Size Medium  Respiratory Rate 22 breaths/min  IPAP 14 cmH20  EPAP 6 cmH2O  Flow Rate 5 lpm  Patient Home Machine No  Patient Home Mask No  Patient Home Tubing No  Auto Titrate No  Device Plugged into RED Power Outlet Yes  BiPAP/CPAP /SiPAP Vitals  Pulse Rate (!) 112  Resp (!) 22  SpO2 98 %  Bilateral Breath Sounds Diminished  MEWS Score/Color  MEWS Score 3  MEWS Score Color Yellow

## 2023-12-02 DIAGNOSIS — J189 Pneumonia, unspecified organism: Secondary | ICD-10-CM | POA: Diagnosis not present

## 2023-12-02 DIAGNOSIS — J441 Chronic obstructive pulmonary disease with (acute) exacerbation: Secondary | ICD-10-CM | POA: Diagnosis not present

## 2023-12-02 LAB — CBC
HCT: 37 % (ref 36.0–46.0)
Hemoglobin: 11.3 g/dL — ABNORMAL LOW (ref 12.0–15.0)
MCH: 25.9 pg — ABNORMAL LOW (ref 26.0–34.0)
MCHC: 30.5 g/dL (ref 30.0–36.0)
MCV: 84.9 fL (ref 80.0–100.0)
Platelets: 221 K/uL (ref 150–400)
RBC: 4.36 MIL/uL (ref 3.87–5.11)
RDW: 22.3 % — ABNORMAL HIGH (ref 11.5–15.5)
WBC: 8.7 K/uL (ref 4.0–10.5)
nRBC: 0 % (ref 0.0–0.2)

## 2023-12-02 LAB — COMPREHENSIVE METABOLIC PANEL WITH GFR
ALT: 26 U/L (ref 0–44)
AST: 15 U/L (ref 15–41)
Albumin: 3.3 g/dL — ABNORMAL LOW (ref 3.5–5.0)
Alkaline Phosphatase: 105 U/L (ref 38–126)
Anion gap: 12 (ref 5–15)
BUN: 19 mg/dL (ref 8–23)
CO2: 32 mmol/L (ref 22–32)
Calcium: 8.9 mg/dL (ref 8.9–10.3)
Chloride: 98 mmol/L (ref 98–111)
Creatinine, Ser: 0.47 mg/dL (ref 0.44–1.00)
GFR, Estimated: >60 mL/min (ref >60)
Glucose, Bld: 117 mg/dL — ABNORMAL HIGH (ref 70–99)
Potassium: 4.6 mmol/L (ref 3.5–5.1)
Sodium: 142 mmol/L (ref 135–145)
Total Bilirubin: 0.5 mg/dL (ref 0.0–1.2)
Total Protein: 5.6 g/dL — ABNORMAL LOW (ref 6.5–8.1)

## 2023-12-02 LAB — PHOSPHORUS: Phosphorus: 3.4 mg/dL (ref 2.5–4.6)

## 2023-12-02 LAB — MAGNESIUM: Magnesium: 2 mg/dL (ref 1.7–2.4)

## 2023-12-02 LAB — C-REACTIVE PROTEIN: CRP: <0.5 mg/dL (ref <1.0)

## 2023-12-02 MED ORDER — DEXAMETHASONE 4 MG PO TABS
4.0000 mg | ORAL_TABLET | Freq: Every morning | ORAL | Status: DC
  Administered 2023-12-02 – 2023-12-03 (×2): 4 mg via ORAL
  Filled 2023-12-02 (×2): qty 1

## 2023-12-02 MED ORDER — BISOPROLOL FUMARATE 5 MG PO TABS
2.5000 mg | ORAL_TABLET | Freq: Every day | ORAL | Status: DC
Start: 2023-12-02 — End: 2023-12-03
  Administered 2023-12-02 – 2023-12-03 (×2): 2.5 mg via ORAL
  Filled 2023-12-02 (×2): qty 1

## 2023-12-02 NOTE — Plan of Care (Signed)
  Problem: Education: Goal: Knowledge of General Education information will improve Description: Including pain rating scale, medication(s)/side effects and non-pharmacologic comfort measures Outcome: Progressing   Problem: Health Behavior/Discharge Planning: Goal: Ability to manage health-related needs will improve Outcome: Progressing   Problem: Clinical Measurements: Goal: Ability to maintain clinical measurements within normal limits will improve Outcome: Progressing Goal: Will remain free from infection Outcome: Progressing Goal: Diagnostic test results will improve Outcome: Progressing   Problem: Nutrition: Goal: Adequate nutrition will be maintained Outcome: Progressing   Problem: Coping: Goal: Level of anxiety will decrease Outcome: Progressing   Problem: Elimination: Goal: Will not experience complications related to bowel motility Outcome: Progressing Goal: Will not experience complications related to urinary retention Outcome: Progressing   Problem: Pain Managment: Goal: General experience of comfort will improve and/or be controlled Outcome: Progressing   Problem: Safety: Goal: Ability to remain free from injury will improve Outcome: Progressing   Problem: Skin Integrity: Goal: Risk for impaired skin integrity will decrease Outcome: Progressing   Problem: Education: Goal: Knowledge of disease or condition will improve Outcome: Progressing Goal: Knowledge of the prescribed therapeutic regimen will improve Outcome: Progressing Goal: Individualized Educational Video(s) Outcome: Progressing   Problem: Activity: Goal: Ability to tolerate increased activity will improve Outcome: Progressing Goal: Will verbalize the importance of balancing activity with adequate rest periods Outcome: Progressing   Problem: Respiratory: Goal: Ability to maintain a clear airway will improve Outcome: Progressing Goal: Levels of oxygenation will improve Outcome:  Progressing

## 2023-12-02 NOTE — Plan of Care (Signed)

## 2023-12-02 NOTE — Progress Notes (Addendum)
 PROGRESS NOTE   Courtney Zuniga  FMW:[id number]    DOB: 1951-01-18    DOA: 11/29/2023  PCP: Myra Geni ORN, FNP   I have briefly reviewed patients previous medical records in [facility].   Brief Hospital Course:  73 y.o. female, stays with her sister, IADL, with medical history significant for chronic respiratory failure with hypoxia on oxygen  4 L/min continuously, stage IV lung cancer, aortic stenosis, COPD, hypertension, coronary artery disease, presented to the ED with complaints of difficulty breathing and cough that began the day prior.     Recently hospitalized 7/12 to 7/18-chest x-ray findings suspicious for pneumonia left lower lung.  She was managed for acute on chronic respiratory failure secondary to COPD exacerbation and pneumonia.  Treated with IV ceftriaxone  and azithromycin .  She was discharged home with outpatient hospice.   ED Course: T-max-99.5.  Heart rate 102-105.  Respiratory 22.  Blood pressure systolic 96-116.  O2 sats 94 to 100% on 4 L. Lactic acid 2.2 >> 1.8. WBC 9.5. Troponin 24 >> 21. CTA chest- Bronchial wall thickening with multifocal mucous plugging and associated airspace disease in the bilateral lower lobes, concerning for infection. Stable spiculated opacity in the right upper lobe and irregular opacities in the right lung.  Admitted for pneumonia.  Improving.  Possible DC home 8/21.   Assessment & Plan:   Severe sepsis due to pneumonia History of underlying stage IV lung cancer.  Recent hospitalization for pneumonia of left lower lung with management as noted above Presented with dyspnea and cough On admission, afebrile, tachypneic up to 25/min, sustained tachycardia in the 100s Lactate of 2.2. CTA chest findings as noted above Met for sepsis criteria based on SIRS plus CT findings.  Also met criteria for severe sepsis based on elevated lactate Treated with IV fluid bolus, maintenance IV fluids, empiric IV ceftriaxone  and azithromycin , day 3  antibiotics today Blood cultures x 2 negative to date.  COPD exacerbation Treated with IV steroids, antibiotics as noted above, bronchodilator nebs with improvement Exacerbation has resolved Transition from IV Solu-Medrol  to her home dose of dexamethasone  4 mg daily.  Essential hypertension Bisoprolol  was held early on in admission due to soft blood pressures.  Likely having mild beta-blocker withdrawal tachycardia.  Now that blood pressures have normalized, resume home dose of bisoprolol   Adenocarcinoma of right lung, stage IV Follows with Dr. Gatha, last note on file 03/2023.  She has received radiation, chemotherapy and was on immunotherapy.  Currently under observation. Megace , chronic pain meds. Continue home dose of dexamethasone  4 mg daily.  Chronic HFpEF Compensated.  Holding Lasix , resume at DC  Chronic respiratory failure with hypoxia on 4 L/min [place] oxygen  Stable.   Body mass index is 20.35 kg/m.   DVT prophylaxis: enoxaparin  (LOVENOX ) injection 40 mg Start: 11/29/23 2200     Code Status: Full Code:  Family Communication: Left VM message for patient's daughter. Disposition:  Status is: Inpatient Remains inpatient appropriate because: Continue IV antibiotics for additional day and possible DC home tomorrow.  Will also get PT evaluation.     Consultants:   None  Procedures:     Subjective:  States that she has been staying with her sister for the last 2 months because of mold in her house.  Indicates that she is supposed to be using CPAP but has been unable to for unclear reasons.  On home O2 as noted above.  Quit smoking about a month ago.  Completed radiation therapy.  States that she is  supposed to follow-up with Dr. Sherrod with follow-up CT for further cancer treatment.  Denies dyspnea or cough.  Objective:   Vitals:   12/01/23 1942 12/01/23 2206 12/02/23 0123 12/02/23 0741  BP:      Pulse:  (!) 112    Resp:  (!) 22 20   Temp:      TempSrc:       SpO2: 96% 98% 97% 96%  Weight:      Height:        General exam: Elderly female, moderately built and nourished sitting up comfortably in bed without distress. Respiratory system: Rightly diminished breath sounds in the bases but otherwise clear to auscultation.  No increased work of breathing. Cardiovascular system: S1 & S2 heard, RRR. No JVD, murmurs, rubs, gallops or clicks. No pedal edema.  Telemetry personally reviewed, sinus tachycardia in the low 100s. Gastrointestinal system: Abdomen is nondistended, soft and nontender. No organomegaly or masses felt. Normal bowel sounds heard. Central nervous system: Alert and oriented. No focal neurological deficits. Extremities: Symmetric 5 x 5 power. Skin: No rashes, lesions or ulcers Psychiatry: Judgement and insight appear normal. Mood & affect appropriate.     Data Reviewed:   I have personally reviewed following labs and imaging studies   CBC: Recent Labs  Lab 11/29/23 1551 11/30/23 0444 12/01/23 0448 12/02/23 0446  WBC 9.5 7.6 9.3 8.7  NEUTROABS 8.8*  --   --   --   HGB 12.4 11.4* 11.8* 11.3*  HCT 40.7 36.7 38.9 37.0  MCV 86.2 85.3 85.1 84.9  PLT 198 196 220 221    Basic Metabolic Panel: Recent Labs  Lab 11/29/23 1551 11/30/23 0444 12/01/23 0448 12/02/23 0446  NA 138 139 142 142  K 3.7 4.5 4.5 4.6  CL 94* 100 100 98  CO2 34* 32 34* 32  GLUCOSE 76 87 136* 117*  BUN 20 19 16 19   CREATININE 0.56 0.41* 0.43* 0.47  CALCIUM  8.6* 8.7* 9.1 8.9  MG  --   --  2.0 2.0  PHOS  --   --  3.3 3.4    Liver Function Tests: Recent Labs  Lab 11/29/23 1551 12/01/23 0448 12/02/23 0446  AST 19 18 15   ALT 24 27 26   ALKPHOS 117 111 105  BILITOT 0.2 0.3 0.5  PROT 6.2* 6.1* 5.6*  ALBUMIN  3.4* 3.3* 3.3*    CBG: Recent Labs  Lab 11/29/23 2157  GLUCAP 253*    Microbiology Studies:   Recent Results (from the past 240 hours)  Resp panel by RT-PCR (RSV, Flu A&B, Covid) Anterior Nasal Swab     Status: None   Collection  Time: 11/29/23  3:23 PM   Specimen: Anterior Nasal Swab  Result Value Ref Range Status   SARS Coronavirus 2 by RT PCR NEGATIVE NEGATIVE Final    Comment: (NOTE) SARS-CoV-2 target nucleic acids are NOT DETECTED.  The SARS-CoV-2 RNA is generally detectable in upper respiratory specimens during the acute phase of infection. The lowest concentration of SARS-CoV-2 viral copies this assay can detect is 138 copies/mL. A negative result does not preclude SARS-Cov-2 infection and should not be used as the sole basis for treatment or other patient management decisions. A negative result may occur with  improper specimen collection/handling, submission of specimen other than nasopharyngeal swab, presence of viral mutation(s) within the areas targeted by this assay, and inadequate number of viral copies(<138 copies/mL). A negative result must be combined with clinical observations, patient history, and epidemiological information. The expected result  is Negative.  Fact Sheet for Patients:  BloggerCourse.com  Fact Sheet for Healthcare Providers:  SeriousBroker.it  This test is no t yet approved or cleared by the [place]  FDA and  has been authorized for detection and/or diagnosis of SARS-CoV-2 by FDA under an Emergency Use Authorization (EUA). This EUA will remain  in effect (meaning this test can be used) for the duration of the COVID-19 declaration under Section 564(b)(1) of the Act, 21 U.S.C.section 360bbb-3(b)(1), unless the authorization is terminated  or revoked sooner.       Influenza A by PCR NEGATIVE NEGATIVE Final   Influenza B by PCR NEGATIVE NEGATIVE Final    Comment: (NOTE) The Xpert Xpress SARS-CoV-2/FLU/RSV plus assay is intended as an aid in the diagnosis of influenza from Nasopharyngeal swab specimens and should not be used as a sole basis for treatment. Nasal washings and aspirates are unacceptable for Xpert Xpress  SARS-CoV-2/FLU/RSV testing.  Fact Sheet for Patients: BloggerCourse.com  Fact Sheet for Healthcare Providers: SeriousBroker.it  This test is not yet approved or cleared by the [place]  FDA and has been authorized for detection and/or diagnosis of SARS-CoV-2 by FDA under an Emergency Use Authorization (EUA). This EUA will remain in effect (meaning this test can be used) for the duration of the COVID-19 declaration under Section 564(b)(1) of the Act, 21 U.S.C. section 360bbb-3(b)(1), unless the authorization is terminated or revoked.     Resp Syncytial Virus by PCR NEGATIVE NEGATIVE Final    Comment: (NOTE) Fact Sheet for Patients: BloggerCourse.com  Fact Sheet for Healthcare Providers: SeriousBroker.it  This test is not yet approved or cleared by the [place]  FDA and has been authorized for detection and/or diagnosis of SARS-CoV-2 by FDA under an Emergency Use Authorization (EUA). This EUA will remain in effect (meaning this test can be used) for the duration of the COVID-19 declaration under Section 564(b)(1) of the Act, 21 U.S.C. section 360bbb-3(b)(1), unless the authorization is terminated or revoked.  Performed at [facility], [address], [place], [place] [address]   Culture, blood (routine x 2)     Status: None (Preliminary result)   Collection Time: 11/29/23  3:51 PM   Specimen: BLOOD  Result Value Ref Range Status   Specimen Description BLOOD BLOOD RIGHT ARM  Final   Special Requests   Final    BOTTLES DRAWN AEROBIC AND ANAEROBIC Blood Culture adequate volume   Culture   Final    NO GROWTH 3 DAYS Performed at [facility], [address], [place], [place] [address]    Report Status PENDING  Incomplete  Culture, blood (routine x 2)     Status: None (Preliminary result)   Collection Time: 11/29/23  4:14 PM   Specimen: BLOOD  Result Value Ref Range  Status   Specimen Description BLOOD BLOOD RIGHT HAND  Final   Special Requests   Final    BOTTLES DRAWN AEROBIC AND ANAEROBIC Blood Culture adequate volume   Culture   Final    NO GROWTH 3 DAYS Performed at [facility], [address], [place], [place] [address]    Report Status PENDING  Incomplete    Radiology Studies:  ECHOCARDIOGRAM COMPLETE Result Date: 12/01/2023    ECHOCARDIOGRAM REPORT   Patient Name:   MIKAILA GRUNERT Date of Exam: 11/30/2023 Medical Rec #:  [id number]       Height:       66.0 in Accession #:    [id number]      Weight:  126.1 lb Date of Birth:  10-15-1950       BSA:          1.644 m Patient Age:    73 years        BP:           104/67 mmHg Patient Gender: F               HR:           105 bpm. Exam Location:  [facility] Procedure: 2D Echo, Cardiac Doppler and Color Doppler (Both Spectral and Color            Flow Doppler were utilized during procedure). Indications:    Elevated Troponin  History:        Patient has prior history of Echocardiogram examinations, most                 recent 04/18/2022. COPD, Aortic Valve Disease, Arrythmias:PVC;                 Risk Factors:Hypertension. Hx of nicotine , aortic stenosis and                 insufficiency. On BiPAP during echo.  Sonographer:    Aida Pizza RCS Referring Phys: [id number] [facility]  Sonographer Comments: Image acquisition challenging due to respiratory motion. IMPRESSIONS  1. Left ventricular ejection fraction, by estimation, is 60 to 65%. The left ventricle has normal function. The left ventricle has no regional wall motion abnormalities. Left ventricular diastolic parameters are consistent with Grade I diastolic dysfunction (impaired relaxation).  2. Right ventricular systolic function is moderately reduced. The right ventricular size is severely enlarged. There is severely elevated pulmonary artery systolic pressure. The estimated right ventricular systolic pressure is 62.3 mmHg.  3. Right atrial size was severely  dilated.  4. The mitral valve is grossly normal. No evidence of mitral valve regurgitation. No evidence of mitral stenosis.  5. Tricuspid valve regurgitation is mild to moderate.  6. The aortic valve has an indeterminant number of cusps. There is moderate calcification of the aortic valve. There is moderate thickening of the aortic valve. Aortic valve regurgitation is moderate. Mild aortic valve stenosis. Aortic regurgitation PHT  measures 243 msec. Aortic valve mean gradient measures 13.3 mmHg. Aortic valve Vmax measures 2.58 m/s.  7. The inferior vena cava is dilated in size with <50% respiratory variability, suggesting right atrial pressure of 15 mmHg. Comparison(s): Changes from prior study are noted. RV function is moderately reduced and RV size is severely enlarged. FINDINGS  Left Ventricle: Left ventricular ejection fraction, by estimation, is 60 to 65%. The left ventricle has normal function. The left ventricle has no regional wall motion abnormalities. Strain was performed and the global longitudinal strain is indeterminate. The left ventricular internal cavity size was normal in size. There is no left ventricular hypertrophy. Left ventricular diastolic parameters are consistent with Grade I diastolic dysfunction (impaired relaxation). Normal left ventricular filling pressure. Right Ventricle: The right ventricular size is severely enlarged. No increase in right ventricular wall thickness. Right ventricular systolic function is moderately reduced. There is severely elevated pulmonary artery systolic pressure. The tricuspid regurgitant velocity is 3.44 m/s, and with an assumed right atrial pressure of 15 mmHg, the estimated right ventricular systolic pressure is 62.3 mmHg. Left Atrium: Left atrial size was normal in size. Right Atrium: Right atrial size was severely dilated. Pericardium: There is no evidence of pericardial effusion. Mitral Valve: The mitral valve is grossly normal. No  evidence of mitral  valve regurgitation. No evidence of mitral valve stenosis. Tricuspid Valve: The tricuspid valve is grossly normal. Tricuspid valve regurgitation is mild to moderate. No evidence of tricuspid stenosis. Aortic Valve: The aortic valve has an indeterminant number of cusps. There is moderate calcification of the aortic valve. There is moderate thickening of the aortic valve. Aortic valve regurgitation is moderate. Aortic regurgitation PHT measures 243 msec. Mild aortic stenosis is present. Aortic valve mean gradient measures 13.3 mmHg. Aortic valve peak gradient measures 26.6 mmHg. Aortic valve area, by VTI measures 1.15 cm. Pulmonic Valve: The pulmonic valve was normal in structure. Pulmonic valve regurgitation is mild. No evidence of pulmonic stenosis. Aorta: The aortic root is normal in size and structure. Venous: The inferior vena cava is dilated in size with less than 50% respiratory variability, suggesting right atrial pressure of 15 mmHg. IAS/Shunts: No atrial level shunt detected by color flow Doppler. Additional Comments: 3D was performed not requiring image post processing on an independent workstation and was indeterminate.  LEFT VENTRICLE PLAX 2D LVIDd:         3.90 cm   Diastology LVIDs:         2.30 cm   LV e' medial:    8.16 cm/s LV PW:         1.10 cm   LV E/e' medial:  11.3 LV IVS:        1.00 cm   LV e' lateral:   11.70 cm/s LVOT diam:     1.80 cm   LV E/e' lateral: 7.9 LV SV:         49 LV SV Index:   30 LVOT Area:     2.54 cm  RIGHT VENTRICLE RV S prime:     12.90 cm/s TAPSE (M-mode): 2.3 cm LEFT ATRIUM             Index        RIGHT ATRIUM           Index LA diam:        2.60 cm 1.58 cm/m   RA Area:     21.50 cm LA Vol (A2C):   20.6 ml 12.53 ml/m  RA Volume:   69.80 ml  42.46 ml/m LA Vol (A4C):   31.2 ml 18.98 ml/m LA Biplane Vol: 26.5 ml 16.12 ml/m  AORTIC VALVE AV Area (Vmax):    1.21 cm AV Area (Vmean):   1.14 cm AV Area (VTI):     1.15 cm AV Vmax:           258.00 cm/s AV Vmean:           162.667 cm/s AV VTI:            0.425 m AV Peak Grad:      26.6 mmHg AV Mean Grad:      13.3 mmHg LVOT Vmax:         123.00 cm/s LVOT Vmean:        73.100 cm/s LVOT VTI:          0.192 m LVOT/AV VTI ratio: 0.45 AI PHT:            243 msec  AORTA Ao Root diam: 3.20 cm MITRAL VALVE                TRICUSPID VALVE MV Area (PHT): 3.60 cm     TR Peak grad:   47.3 mmHg MV Decel Time: 211 msec     TR Vmax:  344.00 cm/s MV E velocity: 92.10 cm/s MV A velocity: 128.00 cm/s  SHUNTS MV E/A ratio:  0.72         Systemic VTI:  0.19 m                             Systemic Diam: 1.80 cm Vishnu Priya Mallipeddi Electronically signed by Diannah Late Mallipeddi Signature Date/Time: 12/01/2023/10:46:51 AM    Final     Scheduled Meds:    arformoterol   15 mcg Nebulization BID   budesonide  (PULMICORT ) nebulizer solution  0.5 mg Nebulization BID   docusate sodium   100 mg Oral BID   enoxaparin  (LOVENOX ) injection  40 mg Subcutaneous Q24H   feeding supplement  237 mL Oral TID BM   guaiFENesin   10 mL Oral Q6H   ipratropium-albuterol   3 mL Nebulization TID   megestrol   400 mg Oral TID   methylPREDNISolone  (SOLU-MEDROL ) injection  40 mg Intravenous Q8H   pantoprazole   40 mg Oral BID   traZODone   50 mg Oral QHS    Continuous Infusions:    azithromycin  500 mg (12/01/23 1606)   cefTRIAXone  (ROCEPHIN )  IV 2 g (12/01/23 1510)     LOS: 3 days     Trenda Mar, MD,  FACP, [facility], [facility], [employer]   [facility] [place]      To contact the attending provider between 7A-7P or the covering provider during after hours 7P-7A, please log into the web site [email or web address] and access using universal  password for that web site. If you do not have the password, please call the hospital operator.  12/02/2023, 11:21 AM

## 2023-12-03 DIAGNOSIS — J189 Pneumonia, unspecified organism: Secondary | ICD-10-CM | POA: Diagnosis not present

## 2023-12-03 NOTE — Discharge Instructions (Signed)

## 2023-12-03 NOTE — Plan of Care (Signed)
  Problem: Education: Goal: Knowledge of General Education information will improve Description: Including pain rating scale, medication(s)/side effects and non-pharmacologic comfort measures Outcome: Adequate for Discharge   Problem: Health Behavior/Discharge Planning: Goal: Ability to manage health-related needs will improve Outcome: Adequate for Discharge   Problem: Clinical Measurements: Goal: Ability to maintain clinical measurements within normal limits will improve Outcome: Adequate for Discharge Goal: Will remain free from infection Outcome: Adequate for Discharge Goal: Diagnostic test results will improve Outcome: Adequate for Discharge Goal: Respiratory complications will improve Outcome: Adequate for Discharge Goal: Cardiovascular complication will be avoided Outcome: Adequate for Discharge   Problem: Activity: Goal: Risk for activity intolerance will decrease Outcome: Adequate for Discharge   Problem: Nutrition: Goal: Adequate nutrition will be maintained Outcome: Adequate for Discharge   Problem: Coping: Goal: Level of anxiety will decrease Outcome: Adequate for Discharge   Problem: Elimination: Goal: Will not experience complications related to bowel motility Outcome: Adequate for Discharge Goal: Will not experience complications related to urinary retention Outcome: Adequate for Discharge   Problem: Pain Managment: Goal: General experience of comfort will improve and/or be controlled Outcome: Adequate for Discharge   Problem: Safety: Goal: Ability to remain free from injury will improve Outcome: Adequate for Discharge   Problem: Skin Integrity: Goal: Risk for impaired skin integrity will decrease Outcome: Adequate for Discharge   Problem: Education: Goal: Knowledge of disease or condition will improve Outcome: Adequate for Discharge Goal: Knowledge of the prescribed therapeutic regimen will improve Outcome: Adequate for Discharge Goal:  Individualized Educational Video(s) Outcome: Adequate for Discharge   Problem: Activity: Goal: Ability to tolerate increased activity will improve Outcome: Adequate for Discharge Goal: Will verbalize the importance of balancing activity with adequate rest periods Outcome: Adequate for Discharge   Problem: Respiratory: Goal: Ability to maintain a clear airway will improve Outcome: Adequate for Discharge Goal: Levels of oxygenation will improve Outcome: Adequate for Discharge Goal: Ability to maintain adequate ventilation will improve Outcome: Adequate for Discharge   Problem: Activity: Goal: Ability to tolerate increased activity will improve Outcome: Adequate for Discharge   Problem: Clinical Measurements: Goal: Ability to maintain a body temperature in the normal range will improve Outcome: Adequate for Discharge   Problem: Respiratory: Goal: Ability to maintain adequate ventilation will improve Outcome: Adequate for Discharge Goal: Ability to maintain a clear airway will improve Outcome: Adequate for Discharge   Problem: Acute Rehab PT Goals(only PT should resolve) Goal: Pt Will Go Supine/Side To Sit Outcome: Adequate for Discharge Goal: Patient Will Transfer Sit To/From Stand Outcome: Adequate for Discharge Goal: Pt Will Transfer Bed To Chair/Chair To Bed Outcome: Adequate for Discharge Goal: Pt Will Ambulate Outcome: Adequate for Discharge

## 2023-12-03 NOTE — TOC Transition Note (Addendum)
 Transition of Care [facility]) - Discharge Note   Patient Details  Name: Courtney Zuniga MRN: [id number] Date of Birth: Sep 18, 1950  [facility] CM/SW Contact:  Noreen KATHEE Cleotilde ISRAEL Phone Number: 12/03/2023, 2:57 PM   Clinical Narrative:     PT recommended SNF. Patient declined SNF and wanting HH, with no agency preference. CSW reached out  multiple agencies due to patient [facility] insurance and [place] with [facility] was able to accept referral. TOC signing off.   Final next level of care: Home w Home Health Services Barriers to Discharge: Barriers Resolved   Patient Goals and CMS Choice Patient states their goals for this hospitalization and ongoing recovery are:: return back home CMS Medicare.gov Compare Post Acute Care list provided to:: Patient Choice offered to / list presented to : Patient      Discharge Placement                Patient to be transferred to facility by: POV Name of family member notified: Arlynn Patient and family notified of of transfer: 12/03/23  Discharge Plan and Services Additional resources added to the After Visit Summary for   In-house Referral: Clinical Social Work   Post Acute Care Choice: Durable Medical Equipment                    HH Arranged: Refused SNF, PT & OT [facility] Agency: [facility] Date [facility] Agency Contacted: 12/03/23 Time HH Agency Contacted: [phone] Representative spoke with at [facility] Agency: Darleene  Social Drivers of Health (SDOH) Interventions SDOH Screenings   Food Insecurity: No Food Insecurity (11/29/2023)  Housing: Low Risk  (11/29/2023)  Transportation Needs: No Transportation Needs (11/29/2023)  Utilities: Not At Risk (11/29/2023)  Financial Resource Strain: Low Risk  (05/05/2018)  Physical Activity: Insufficiently Active (05/05/2018)  Social Connections: Unknown (11/29/2023)  Recent Concern: Social Connections - Moderately Isolated (10/25/2023)  Stress: No Stress Concern Present (05/05/2018)  Tobacco Use: Medium  Risk (11/30/2023)     Readmission Risk Interventions    12/03/2023    2:47 PM 12/02/2023    1:35 PM 12/01/2023    3:09 PM  Readmission Risk Prevention Plan  Transportation Screening Complete Complete Complete  Medication Review [employer])  Complete Complete  HRI or Home Care Consult Complete Complete Complete  SW Recovery Care/Counseling Consult Complete Complete Complete  Palliative Care Screening Not Applicable Not Applicable Not Applicable  Skilled Nursing Facility Not Applicable Not Applicable Not Applicable

## 2023-12-03 NOTE — Discharge Summary (Addendum)
 Physician Discharge Summary  Courtney Zuniga FMW:[id number] DOB: 17-Jul-1950  PCP: Myra Geni ORN, FNP  Admitted from: Home Discharged to: Home  Admit date: 11/29/2023 Discharge date: 12/03/2023  Recommendations for Outpatient Follow-up:    Follow-up Information     Myra Geni ORN, FNP. Schedule an appointment as soon as possible for a visit in 1 week(s).   Specialty: Family Medicine Why: To be seen with repeat labs (CBC & BMP). Contact information: [address] [place] [address] [phone]         Darlean Ozell NOVAK, MD. Schedule an appointment as soon as possible for a visit in 1 week(s).   Specialty: Pulmonary Disease Why: MD to address issues pertaining to NIV.  Since recommendations made in April 2025, patient does not seem to have NIV at home. Contact information: [address] [place] [address] [phone]                  Home Health: As per PT recommendations, SNF was recommended but patient has repeatedly refused SNF to PT and TOC.  Ordered home health PT, OT.    Equipment/Devices: Continue prior home oxygen  at 4 L/min continuously via nasal cannula.  Patient to follow-up with Dr. Darlean, PCCM regarding arranging NIV at home.    Discharge Condition: Improved and stable.  Overall prognosis is guarded and poor.   Code Status: Full Code Diet recommendation:  Discharge Diet Orders (From admission, onward)     Start     Ordered   12/03/23 0000  Diet - low sodium heart healthy        12/03/23 1416             Discharge Diagnoses:  Principal Problem:   PNA (pneumonia) Active Problems:   Severe sepsis (HCC)   COPD exacerbation (HCC)   Essential hypertension   Adenocarcinoma of right lung, stage 4   Chronic respiratory failure with hypoxia (HCC)   (HFpEF) heart failure with preserved ejection fraction [facility])   Brief Hospital Course:  73 y.o. female, stays with her sister, IADL, with medical history significant for chronic respiratory  failure with hypoxia on oxygen  4 L/min continuously, stage IV lung cancer, aortic stenosis, COPD, hypertension, coronary artery disease, presented to the ED with complaints of difficulty breathing and cough that began the day prior.     Recently hospitalized 7/12 to 7/18-chest x-ray findings suspicious for pneumonia left lower lung.  She was managed for acute on chronic respiratory failure secondary to COPD exacerbation and pneumonia.  Treated with IV ceftriaxone  and azithromycin .  She was discharged home with outpatient hospice.   ED Course: T-max-99.5.  Heart rate 102-105.  Respiratory 22.  Blood pressure systolic 96-116.  O2 sats 94 to 100% on 4 L. Lactic acid 2.2 >> 1.8. WBC 9.5. Troponin 24 >> 21. CTA chest- Bronchial wall thickening with multifocal mucous plugging and associated airspace disease in the bilateral lower lobes, concerning for infection. Stable spiculated opacity in the right upper lobe and irregular opacities in the right lung.   Admitted for pneumonia.  Improved.     Assessment & Plan:    Severe sepsis due to pneumonia History of underlying stage IV lung cancer.  Recent hospitalization for pneumonia of left lower lung with management as noted above Presented with dyspnea and cough On admission, afebrile, tachypneic up to 25/min, sustained tachycardia in the 100s Lactate of 2.2. CTA chest findings as noted below. Met for sepsis criteria based on SIRS plus CT findings.  Also  met criteria for severe sepsis based on elevated lactate Treated with IV fluid bolus, maintenance IV fluids, empiric IV ceftriaxone  and azithromycin , day 5 antibiotics today.  Patient will complete day 5 IV antibiotics in the hospital and no further antibiotics at discharge. Blood cultures x 2 negative to date.   COPD exacerbation Treated with IV steroids, antibiotics as noted above, bronchodilator nebs with improvement Exacerbation has resolved Transitioned from IV Solu-Medrol  to her home dose of  dexamethasone  4 mg daily.   Essential hypertension Bisoprolol  was held early on in admission due to soft blood pressures.  Likely having mild beta-blocker withdrawal tachycardia.  Now that blood pressures have normalized, resumed home dose of bisoprolol  Controlled.   Adenocarcinoma of right lung, stage IV Follows with Dr. Gatha, last note on file 03/2023.  She has received radiation, chemotherapy and was on immunotherapy.  Currently under observation. Megace , chronic pain meds. Continue home dose of dexamethasone  4 mg daily.   Chronic HFpEF Compensated.  Continue continue prior home meds at discharge   Chronic respiratory failure with hypoxia and hypercapnia, on 4 L/min [place] oxygen  Stable. Patient sees Dr. Darlean, pulmonology.  Appreciated his office note from 08/07/2023.  He was planning to arrange NIV with [facility] but patient states that she does not have it at home.  Has known VBG from July shows compensated hypercarbia.  Patient to closely follow-up with Dr. Darlean to try and assist with arranging the NIV.  Adult failure to thrive Multifactorial due to advanced age, frail physical health, multiple significant and severe comorbidities as noted above. Consider palliative care consultation and follow-up as outpatient.    Body mass index is 20.35 kg/m.     Consultants:   None   Procedures:       Discharge Instructions  Discharge Instructions     (HEART FAILURE PATIENTS) Call MD:  Anytime you have any of the following symptoms: 1) 3 pound weight gain in 24 hours or 5 pounds in 1 week 2) shortness of breath, with or without a dry hacking cough 3) swelling in the hands, feet or stomach 4) if you have to sleep on extra pillows at night in order to breathe.   Complete by: As directed    Call MD for:  difficulty breathing, headache or visual disturbances   Complete by: As directed    Call MD for:  extreme fatigue   Complete by: As directed    Call MD for:  persistant dizziness or  light-headedness   Complete by: As directed    Call MD for:  persistant nausea and vomiting   Complete by: As directed    Call MD for:  severe uncontrolled pain   Complete by: As directed    Call MD for:  temperature >100.4   Complete by: As directed    Diet - low sodium heart healthy   Complete by: As directed    Discharge instructions   Complete by: As directed    Continue prior home oxygen  at 4 L/min via nasal cannula continuously.   Increase activity slowly   Complete by: As directed         Medication List     STOP taking these medications    Combivent  Respimat 20-100 MCG/ACT Aers respimat Generic drug: Ipratropium-Albuterol        TAKE these medications    albuterol  (2.5 MG/3ML) 0.083% nebulizer solution Commonly known as: PROVENTIL  Take 2.5 mg by nebulization 3 (three) times daily as needed.   Ventolin  HFA 108 (90 Base)  MCG/ACT inhaler Generic drug: albuterol  Inhale 2 puffs into the lungs every 4 (four) hours as needed.   BC HEADACHE POWDER PO Take 1 packet by mouth as needed (headache).   benzonatate 100 MG capsule Commonly known as: TESSALON Take 100 mg by mouth 3 (three) times daily as needed for cough.   bisoprolol  5 MG tablet Commonly known as: ZEBETA  Take 0.5 tablets (2.5 mg total) by mouth daily.   dexamethasone  4 MG tablet Commonly known as: DECADRON  Take 4 mg by mouth every morning.   docusate sodium  100 MG capsule Commonly known as: Colace Take 1 capsule (100 mg total) by mouth 2 (two) times daily.   Ensure Take 237 mLs by mouth 3 (three) times daily between meals.   ferrous sulfate  325 (65 FE) MG tablet Take 1 tablet (325 mg total) by mouth daily with breakfast.   furosemide  20 MG tablet Commonly known as: LASIX  Take 1 tablet (20 mg total) by mouth every Monday, Wednesday, and Friday.   megestrol  40 MG/ML suspension Commonly known as: MEGACE  Take 400 mg by mouth 3 (three) times daily.   oxyCODONE -acetaminophen  5-325 MG  tablet Commonly known as: PERCOCET/ROXICET Take 1 tablet by mouth every 6 (six) hours as needed for moderate pain (pain score 4-6) or severe pain (pain score 7-10).   pantoprazole  40 MG tablet Commonly known as: PROTONIX  Take 1 tablet (40 mg total) by mouth 2 (two) times daily.   PEG 3350  17 GM/SCOOP Powd Take 17 g by mouth daily.   traZODone  50 MG tablet Commonly known as: DESYREL  Take 1 tablet (50 mg total) by mouth at bedtime.   Trelegy Ellipta  100-62.5-25 MCG/ACT Aepb Generic drug: Fluticasone -Umeclidin-Vilant INHALE 1 PUFF BY MOUTH EVERY DAY       No Known Allergies    Procedures/Studies: ECHOCARDIOGRAM COMPLETE Result Date: 12/01/2023    ECHOCARDIOGRAM REPORT   Patient Name:   Courtney Zuniga Date of Exam: 11/30/2023 Medical Rec #:  [id number]       Height:       66.0 in Accession #:    [id number]      Weight:       126.1 lb Date of Birth:  05/12/1950       BSA:          1.644 m Patient Age:    73 years        BP:           104/67 mmHg Patient Gender: F               HR:           105 bpm. Exam Location:  [facility] Procedure: 2D Echo, Cardiac Doppler and Color Doppler (Both Spectral and Color            Flow Doppler were utilized during procedure). Indications:    Elevated Troponin  History:        Patient has prior history of Echocardiogram examinations, most                 recent 04/18/2022. COPD, Aortic Valve Disease, Arrythmias:PVC;                 Risk Factors:Hypertension. Hx of nicotine , aortic stenosis and                 insufficiency. On BiPAP during echo.  Sonographer:    Aida Pizza RCS Referring Phys: [id number] [facility]  Sonographer Comments: Image acquisition challenging due to respiratory motion. IMPRESSIONS  1. Left ventricular ejection fraction, by estimation, is 60 to 65%. The left ventricle has normal function. The left ventricle has no regional wall motion abnormalities. Left ventricular diastolic parameters are consistent with Grade I diastolic dysfunction  (impaired relaxation).  2. Right ventricular systolic function is moderately reduced. The right ventricular size is severely enlarged. There is severely elevated pulmonary artery systolic pressure. The estimated right ventricular systolic pressure is 62.3 mmHg.  3. Right atrial size was severely dilated.  4. The mitral valve is grossly normal. No evidence of mitral valve regurgitation. No evidence of mitral stenosis.  5. Tricuspid valve regurgitation is mild to moderate.  6. The aortic valve has an indeterminant number of cusps. There is moderate calcification of the aortic valve. There is moderate thickening of the aortic valve. Aortic valve regurgitation is moderate. Mild aortic valve stenosis. Aortic regurgitation PHT  measures 243 msec. Aortic valve mean gradient measures 13.3 mmHg. Aortic valve Vmax measures 2.58 m/s.  7. The inferior vena cava is dilated in size with <50% respiratory variability, suggesting right atrial pressure of 15 mmHg. Comparison(s): Changes from prior study are noted. RV function is moderately reduced and RV size is severely enlarged. FINDINGS  Left Ventricle: Left ventricular ejection fraction, by estimation, is 60 to 65%. The left ventricle has normal function. The left ventricle has no regional wall motion abnormalities. Strain was performed and the global longitudinal strain is indeterminate. The left ventricular internal cavity size was normal in size. There is no left ventricular hypertrophy. Left ventricular diastolic parameters are consistent with Grade I diastolic dysfunction (impaired relaxation). Normal left ventricular filling pressure. Right Ventricle: The right ventricular size is severely enlarged. No increase in right ventricular wall thickness. Right ventricular systolic function is moderately reduced. There is severely elevated pulmonary artery systolic pressure. The tricuspid regurgitant velocity is 3.44 m/s, and with an assumed right atrial pressure of 15 mmHg, the  estimated right ventricular systolic pressure is 62.3 mmHg. Left Atrium: Left atrial size was normal in size. Right Atrium: Right atrial size was severely dilated. Pericardium: There is no evidence of pericardial effusion. Mitral Valve: The mitral valve is grossly normal. No evidence of mitral valve regurgitation. No evidence of mitral valve stenosis. Tricuspid Valve: The tricuspid valve is grossly normal. Tricuspid valve regurgitation is mild to moderate. No evidence of tricuspid stenosis. Aortic Valve: The aortic valve has an indeterminant number of cusps. There is moderate calcification of the aortic valve. There is moderate thickening of the aortic valve. Aortic valve regurgitation is moderate. Aortic regurgitation PHT measures 243 msec. Mild aortic stenosis is present. Aortic valve mean gradient measures 13.3 mmHg. Aortic valve peak gradient measures 26.6 mmHg. Aortic valve area, by VTI measures 1.15 cm. Pulmonic Valve: The pulmonic valve was normal in structure. Pulmonic valve regurgitation is mild. No evidence of pulmonic stenosis. Aorta: The aortic root is normal in size and structure. Venous: The inferior vena cava is dilated in size with less than 50% respiratory variability, suggesting right atrial pressure of 15 mmHg. IAS/Shunts: No atrial level shunt detected by color flow Doppler. Additional Comments: 3D was performed not requiring image post processing on an independent workstation and was indeterminate.  LEFT VENTRICLE PLAX 2D LVIDd:         3.90 cm   Diastology LVIDs:         2.30 cm   LV e' medial:    8.16 cm/s LV PW:         1.10 cm   LV E/e' medial:  11.3 LV IVS:        1.00 cm   LV e' lateral:   11.70 cm/s LVOT diam:     1.80 cm   LV E/e' lateral: 7.9 LV SV:         49 LV SV Index:   30 LVOT Area:     2.54 cm  RIGHT VENTRICLE RV S prime:     12.90 cm/s TAPSE (M-mode): 2.3 cm LEFT ATRIUM             Index        RIGHT ATRIUM           Index LA diam:        2.60 cm 1.58 cm/m   RA Area:      21.50 cm LA Vol (A2C):   20.6 ml 12.53 ml/m  RA Volume:   69.80 ml  42.46 ml/m LA Vol (A4C):   31.2 ml 18.98 ml/m LA Biplane Vol: 26.5 ml 16.12 ml/m  AORTIC VALVE AV Area (Vmax):    1.21 cm AV Area (Vmean):   1.14 cm AV Area (VTI):     1.15 cm AV Vmax:           258.00 cm/s AV Vmean:          162.667 cm/s AV VTI:            0.425 m AV Peak Grad:      26.6 mmHg AV Mean Grad:      13.3 mmHg LVOT Vmax:         123.00 cm/s LVOT Vmean:        73.100 cm/s LVOT VTI:          0.192 m LVOT/AV VTI ratio: 0.45 AI PHT:            243 msec  AORTA Ao Root diam: 3.20 cm MITRAL VALVE                TRICUSPID VALVE MV Area (PHT): 3.60 cm     TR Peak grad:   47.3 mmHg MV Decel Time: 211 msec     TR Vmax:        344.00 cm/s MV E velocity: 92.10 cm/s MV A velocity: 128.00 cm/s  SHUNTS MV E/A ratio:  0.72         Systemic VTI:  0.19 m                             Systemic Diam: 1.80 cm Vishnu Priya Mallipeddi Electronically signed by Diannah Late Mallipeddi Signature Date/Time: 12/01/2023/10:46:51 AM    Final    CT Angio Chest PE W/Cm &/Or Wo Cm Result Date: 11/29/2023 CLINICAL DATA:  Pulmonary embolism suspected, high probability. History of COPD with shortness of breath. EXAM: CT ANGIOGRAPHY CHEST WITH CONTRAST TECHNIQUE: Multidetector CT imaging of the chest was performed using the standard protocol during bolus administration of intravenous contrast. Multiplanar CT image reconstructions and MIPs were obtained to evaluate the vascular anatomy. RADIATION DOSE REDUCTION: This exam was performed according to the departmental dose-optimization program which includes automated exposure control, adjustment of the mA and/or kV according to patient size and/or use of iterative reconstruction technique. CONTRAST:  75mL OMNIPAQUE  IOHEXOL  350 MG/ML SOLN COMPARISON:  11/18/2023. FINDINGS: Cardiovascular: The heart is borderline enlarged and there is no pericardial effusion. Multi-vessel coronary artery calcifications are noted. There  is atherosclerotic calcification of the aorta without evidence of aneurysm. The pulmonary  trunk is distended suggesting underlying pulmonary artery hypertension. There is no evidence of pulmonary embolism. A right chest port terminates in the right atrium. Mediastinum/Nodes: No mediastinal, hilar, or axillary lymphadenopathy. The thyroid  gland and trachea are within normal limits. Debris is noted in the mid esophagus. Lungs/Pleura: Advanced centrilobular and paraseptal emphysematous changes are noted in the lungs. There is bronchial wall thickening bilaterally with multifocal mucous plugging in the bilateral lower lobes with patchy airspace disease. A stable irregular opacity is noted at the right lung apex measuring 1.4 x 1.3 cm. Stable atelectasis, consolidation, or scarring is noted in the perihilar region on the right and right lower lobe. The previously described left apical 6 mm nodule is not well seen on this exam. There are trace bilateral pleural effusions. No pneumothorax is seen. Upper Abdomen: No acute abnormality. Musculoskeletal: Degenerative changes are present in the thoracic spine. No acute osseous abnormality is seen. Review of the MIP images confirms the above findings. IMPRESSION: 1. No evidence of pulmonary embolism. 2. Bronchial wall thickening with multifocal mucous plugging and associated airspace disease in the bilateral lower lobes, concerning for infection. 3. Trace bilateral pleural effusions. 4. Stable spiculated opacity in the right upper lobe and irregular opacities in the right lung. 5. Emphysema. 6. Coronary artery calcifications. 7. Aortic atherosclerosis. Electronically Signed   By: Leita Birmingham M.D.   On: 11/29/2023 17:54   DG Chest Port 1 View Result Date: 11/29/2023 CLINICAL DATA:  Dyspnea. EXAM: PORTABLE CHEST 1 VIEW COMPARISON:  Most recent radiograph 10/26/2023, CT 11/18/2023 FINDINGS: Right chest port in place. Linear metallic density measuring 5.9 cm projecting over the  aortic arch is presumably external to the patient. Right chest port remains in place. The lungs are hyperinflated with emphysema. Bandlike right perihilar opacities not significantly changed from recent CT. The right apical nodule on CT is not well seen by radiograph. Bandlike left lower lobe opacity is similar. No evidence of acute airspace disease, pneumothorax, pulmonary edema or pleural effusion. IMPRESSION: 1. Hyperinflation and emphysema. 2. Chronic changes of bandlike right perihilar and left lower lobe opacities, similar to recent CT. 3. Linear metallic density projecting over the aortic arch is presumably external to the patient. Recommend correlation with physical exam. Electronically Signed   By: Andrea Gasman M.D.   On: 11/29/2023 16:10     Subjective: Denies complaints.  States that she feels much better compared to admission.  Has some chronic baseline dyspnea.  No pain or chest pain reported.  Wants to know when she is discharging home.  So she can call her family for a ride home.  Discharge Exam:  Vitals:   12/02/23 2148 12/03/23 0425 12/03/23 0714 12/03/23 1340  BP: 137/88 128/84  114/66  Pulse: 95 (!) 101  94  Resp: 16 18    Temp: 97.8 F (36.6 C) 98.7 F (37.1 C)  98.5 F (36.9 C)  TempSrc: Axillary Oral  Oral  SpO2: 100% 96% 95% 94%  Weight:      Height:        General exam: Elderly female, moderately built, frail, chronically ill looking, sitting up comfortably in chair without distress. Respiratory system: diminished breath sounds in the bases but otherwise clear to auscultation without wheezing or rhonchi.  No increased work of breathing. Cardiovascular system: S1 & S2 heard, RRR. No JVD, murmurs, rubs, gallops or clicks. No pedal edema.  Telemetry personally reviewed, sinus rhythm with occasional mild sinus tachycardia in the low 100's.   Gastrointestinal system: Abdomen is  nondistended, soft and nontender. No organomegaly or masses felt. Normal bowel sounds  heard. Central nervous system: Alert and oriented. No focal neurological deficits. Extremities: Symmetric 5 x 5 power. Skin: No rashes, lesions or ulcers Psychiatry: Judgement and insight appear normal. Mood & affect appropriate.     The results of significant diagnostics from this hospitalization (including imaging, microbiology, ancillary and laboratory) are listed below for reference.     Microbiology: Recent Results (from the past 240 hours)  Resp panel by RT-PCR (RSV, Flu A&B, Covid) Anterior Nasal Swab     Status: None   Collection Time: 11/29/23  3:23 PM   Specimen: Anterior Nasal Swab  Result Value Ref Range Status   SARS Coronavirus 2 by RT PCR NEGATIVE NEGATIVE Final    Comment: (NOTE) SARS-CoV-2 target nucleic acids are NOT DETECTED.  The SARS-CoV-2 RNA is generally detectable in upper respiratory specimens during the acute phase of infection. The lowest concentration of SARS-CoV-2 viral copies this assay can detect is 138 copies/mL. A negative result does not preclude SARS-Cov-2 infection and should not be used as the sole basis for treatment or other patient management decisions. A negative result may occur with  improper specimen collection/handling, submission of specimen other than nasopharyngeal swab, presence of viral mutation(s) within the areas targeted by this assay, and inadequate number of viral copies(<138 copies/mL). A negative result must be combined with clinical observations, patient history, and epidemiological information. The expected result is Negative.  Fact Sheet for Patients:  BloggerCourse.com  Fact Sheet for Healthcare Providers:  SeriousBroker.it  This test is no t yet approved or cleared by the [place]  FDA and  has been authorized for detection and/or diagnosis of SARS-CoV-2 by FDA under an Emergency Use Authorization (EUA). This EUA will remain  in effect (meaning this test can be  used) for the duration of the COVID-19 declaration under Section 564(b)(1) of the Act, 21 U.S.C.section 360bbb-3(b)(1), unless the authorization is terminated  or revoked sooner.       Influenza A by PCR NEGATIVE NEGATIVE Final   Influenza B by PCR NEGATIVE NEGATIVE Final    Comment: (NOTE) The Xpert Xpress SARS-CoV-2/FLU/RSV plus assay is intended as an aid in the diagnosis of influenza from Nasopharyngeal swab specimens and should not be used as a sole basis for treatment. Nasal washings and aspirates are unacceptable for Xpert Xpress SARS-CoV-2/FLU/RSV testing.  Fact Sheet for Patients: BloggerCourse.com  Fact Sheet for Healthcare Providers: SeriousBroker.it  This test is not yet approved or cleared by the [place]  FDA and has been authorized for detection and/or diagnosis of SARS-CoV-2 by FDA under an Emergency Use Authorization (EUA). This EUA will remain in effect (meaning this test can be used) for the duration of the COVID-19 declaration under Section 564(b)(1) of the Act, 21 U.S.C. section 360bbb-3(b)(1), unless the authorization is terminated or revoked.     Resp Syncytial Virus by PCR NEGATIVE NEGATIVE Final    Comment: (NOTE) Fact Sheet for Patients: BloggerCourse.com  Fact Sheet for Healthcare Providers: SeriousBroker.it  This test is not yet approved or cleared by the [place]  FDA and has been authorized for detection and/or diagnosis of SARS-CoV-2 by FDA under an Emergency Use Authorization (EUA). This EUA will remain in effect (meaning this test can be used) for the duration of the COVID-19 declaration under Section 564(b)(1) of the Act, 21 U.S.C. section 360bbb-3(b)(1), unless the authorization is terminated or revoked.  Performed at [facility], [address] [place], [place] [address]  Culture, blood (routine x 2)     Status: None  (Preliminary result)   Collection Time: 11/29/23  3:51 PM   Specimen: BLOOD  Result Value Ref Range Status   Specimen Description BLOOD BLOOD RIGHT ARM  Final   Special Requests   Final    BOTTLES DRAWN AEROBIC AND ANAEROBIC Blood Culture adequate volume   Culture   Final    NO GROWTH 4 DAYS Performed at [facility], [address], [place], [place] [address]    Report Status PENDING  Incomplete  Culture, blood (routine x 2)     Status: None (Preliminary result)   Collection Time: 11/29/23  4:14 PM   Specimen: BLOOD  Result Value Ref Range Status   Specimen Description BLOOD BLOOD RIGHT HAND  Final   Special Requests   Final    BOTTLES DRAWN AEROBIC AND ANAEROBIC Blood Culture adequate volume   Culture   Final    NO GROWTH 4 DAYS Performed at [facility], [address], [place], [place] [address]    Report Status PENDING  Incomplete     Labs: CBC: Recent Labs  Lab 11/29/23 1551 11/30/23 0444 12/01/23 0448 12/02/23 0446  WBC 9.5 7.6 9.3 8.7  NEUTROABS 8.8*  --   --   --   HGB 12.4 11.4* 11.8* 11.3*  HCT 40.7 36.7 38.9 37.0  MCV 86.2 85.3 85.1 84.9  PLT 198 196 220 221    Basic Metabolic Panel: Recent Labs  Lab 11/29/23 1551 11/30/23 0444 12/01/23 0448 12/02/23 0446  NA 138 139 142 142  K 3.7 4.5 4.5 4.6  CL 94* 100 100 98  CO2 34* 32 34* 32  GLUCOSE 76 87 136* 117*  BUN 20 19 16 19   CREATININE 0.56 0.41* 0.43* 0.47  CALCIUM  8.6* 8.7* 9.1 8.9  MG  --   --  2.0 2.0  PHOS  --   --  3.3 3.4    Liver Function Tests: Recent Labs  Lab 11/29/23 1551 12/01/23 0448 12/02/23 0446  AST 19 18 15   ALT 24 27 26   ALKPHOS 117 111 105  BILITOT 0.2 0.3 0.5  PROT 6.2* 6.1* 5.6*  ALBUMIN  3.4* 3.3* 3.3*    CBG: Recent Labs  Lab 11/29/23 2157  GLUCAP 253*      Time coordinating discharge: 40 minutes  SIGNED:  Trenda Mar, MD,  FACP, [facility], [facility], [facility]   [facility] [employer]     To contact the attending  provider between 7A-7P or the covering provider during after hours 7P-7A, please log into the web site [email or web address] and access using universal [place] password for that web site. If you do not have the password, please call the hospital operator.

## 2023-12-03 NOTE — Plan of Care (Signed)
  Problem: Education: Goal: Knowledge of General Education information will improve Description: Including pain rating scale, medication(s)/side effects and non-pharmacologic comfort measures Outcome: Progressing   Problem: Health Behavior/Discharge Planning: Goal: Ability to manage health-related needs will improve Outcome: Progressing   Problem: Clinical Measurements: Goal: Ability to maintain clinical measurements within normal limits will improve Outcome: Progressing Goal: Will remain free from infection Outcome: Progressing Goal: Diagnostic test results will improve Outcome: Progressing Goal: Respiratory complications will improve Outcome: Progressing   Problem: Nutrition: Goal: Adequate nutrition will be maintained Outcome: Progressing   

## 2023-12-03 NOTE — Plan of Care (Signed)
  Problem: Acute Rehab PT Goals(only PT should resolve) Goal: Pt Will Go Supine/Side To Sit Outcome: Progressing Flowsheets (Taken 12/03/2023 1519) Pt will go Supine/Side to Sit: Independently Goal: Patient Will Transfer Sit To/From Stand Outcome: Progressing Flowsheets (Taken 12/03/2023 1519) Patient will transfer sit to/from stand: with modified independence Goal: Pt Will Transfer Bed To Chair/Chair To Bed Outcome: Progressing Flowsheets (Taken 12/03/2023 1519) Pt will Transfer Bed to Chair/Chair to Bed: with modified independence Goal: Pt Will Ambulate Outcome: Progressing Flowsheets (Taken 12/03/2023 1519) Pt will Ambulate:  10 feet  with least restrictive assistive device  with supervision    3:21 PM, 12/03/23 Rosaria Settler, PT, DPT [place] with [facility]

## 2023-12-03 NOTE — Evaluation (Signed)
 Physical Therapy Evaluation Patient Details Name: Courtney Zuniga MRN: [id number] DOB: 1950-11-26 Today's Date: 12/03/2023  History of Present Illness  Courtney Zuniga is a 73 y.o. female with medical history significant for chronic respiratory failure on 4 L, stage IV lung cancer, aortic stenosis, COPD, hypertension, coronary artery disease.  Patient presented to the ED with complaints of difficulty breathing and cough that began yesterday.  No chest pain.  Reports swelling to her feet only.   Clinical Impression  Patient agreeable to PT evaluation but limited due to fatigue. Patient was received supine in bed, with [place] and cpap donned. Patient able to doff cpap independently. Patient reports at baseline she is independent with mobility, and ADLs but requires some assist with iADL. Reports her son lives with her and is available 24/7 to assist her. On this date, patient is mod independent with bed mobility and requires CGA during STS and bed to chair transfer with no AD due inc fatigue and general weakness. Pt demonstrates inc SOB with transfer and refuses further mobility. SpO2 on 4 Lpm [place] drops to 91% at lowest. Patient tolerates sitting in recliner at end of session with call button within reach. Nursing notified of pt requesting assist for inhaler. Patient will benefit from continued skilled physical therapy acutely and in recommended venue in order to address deficits. Should pt decline rehab, HHPT services would be appropriate.        If plan is discharge home, recommend the following: A little help with walking and/or transfers;Help with stairs or ramp for entrance   Can travel by private vehicle        Equipment Recommendations None recommended by PT  Recommendations for Other Services       Functional Status Assessment Patient has had a recent decline in their functional status and demonstrates the ability to make significant improvements in function in a reasonable and predictable  amount of time.     Precautions / Restrictions Precautions Precautions: Fall Recall of Precautions/Restrictions: Intact Restrictions Weight Bearing Restrictions Per Provider Order: No      Mobility  Bed Mobility Overal bed mobility: Needs Assistance Bed Mobility: Supine to Sit     Supine to sit: Supervision, Modified independent (Device/Increase time), HOB elevated     General bed mobility comments: HOB slightly elevated as pt reports sleeping propped up for breathing, inc time for slow labored movement    Transfers Overall transfer level: Needs assistance Equipment used: None Transfers: Sit to/from Stand, Bed to chair/wheelchair/BSC Sit to Stand: Supervision, Contact guard assist   Step pivot transfers: Supervision, Contact guard assist       General transfer comment: CGA/Supervision for safety as pt demo inc fatigue via slow labored movement and inc SOB, pt leans onto arm rest for support. SpO2 drops to 91% on 4 Lpm during transfer    Ambulation/Gait       General Gait Details: Pt declined further ambulation due to fatigue and SOB, requesting inhaler.  Stairs            Wheelchair Mobility     Tilt Bed    Modified Rankin (Stroke Patients Only)       Balance Overall balance assessment: Needs assistance Sitting-balance support: Feet supported, No upper extremity supported Sitting balance-Leahy Scale: Good Sitting balance - Comments: Seated EOB   Standing balance support: During functional activity, No upper extremity supported, Single extremity supported Standing balance-Leahy Scale: Fair Standing balance comment: without AD         Pertinent Vitals/Pain  Pain Assessment Pain Assessment: No/denies pain    Home Living Family/patient expects to be discharged to:: Private residence Living Arrangements: Children Available Help at Discharge: Family;Available 24 hours/day Type of Home: House Home Access: Stairs to enter Entrance Stairs-Rails:  Left Entrance Stairs-Number of Steps: 3   Home Layout: One level Home Equipment: Agricultural consultant (2 wheels);Shower seat;BSC/3in1 Additional Comments: Pt reports son lives with her and is available 24/7. Reports living in same home as during last admission.    Prior Function Prior Level of Function : Needs assist       Physical Assist : Mobility (physical);ADLs (physical)   ADLs (physical): IADLs Mobility Comments: Pt reports as household and short distance community ambulator without AD, reports she drives some, rides in motorized cart during grocery shopping, reports prior to recent hospitalization pt was working on getting motorized w/c ADLs Comments: Reports as cont Independence w/ ADL; assist IADL     Extremity/Trunk Assessment   Upper Extremity Assessment Upper Extremity Assessment: Generalized weakness    Lower Extremity Assessment Lower Extremity Assessment: Generalized weakness (Pt generally weak throughout with MMT. Shoulder and hip flexion 4-/5)    Cervical / Trunk Assessment Cervical / Trunk Assessment: Kyphotic  Communication   Communication Communication: No apparent difficulties    Cognition Arousal: Alert Behavior During Therapy: WFL for tasks assessed/performed   PT - Cognitive impairments: No apparent impairments       Following commands: Intact       Cueing Cueing Techniques: Verbal cues, Visual cues     General Comments      Exercises     Assessment/Plan    PT Assessment All further PT needs can be met in the next venue of care;Patient needs continued PT services  PT Problem List Decreased strength;Decreased activity tolerance;Decreased balance;Decreased mobility;Cardiopulmonary status limiting activity       PT Treatment Interventions DME instruction;Gait training;Functional mobility training;Therapeutic activities;Therapeutic exercise;Balance training;Patient/family education    PT Goals (Current goals can be found in the Care Plan  section)  Acute Rehab PT Goals Patient Stated Goal: Return home PT Goal Formulation: With patient Time For Goal Achievement: 12/07/23 Potential to Achieve Goals: Good    Frequency Min 3X/week     Co-evaluation               AM-PAC PT 6 Clicks Mobility  Outcome Measure Help needed turning from your back to your side while in a flat bed without using bedrails?: A Little Help needed moving from lying on your back to sitting on the side of a flat bed without using bedrails?: A Little Help needed moving to and from a bed to a chair (including a wheelchair)?: A Little Help needed standing up from a chair using your arms (e.g., wheelchair or bedside chair)?: A Little Help needed to walk in hospital room?: A Lot Help needed climbing 3-5 steps with a railing? : A Lot 6 Click Score: 16    End of Session Equipment Utilized During Treatment: Oxygen  Activity Tolerance: Patient limited by fatigue Patient left: in chair;with call bell/phone within reach;with nursing/sitter in room Nurse Communication: Mobility status;Other (comment) (Pt requesting assist for inhaler) PT Visit Diagnosis: Other abnormalities of gait and mobility (R26.89);Muscle weakness (generalized) (M62.81)    Time: 8995-8978 PT Time Calculation (min) (ACUTE ONLY): 17 min   Charges:   PT Evaluation $PT Eval Low Complexity: 1 Low   PT General Charges $$ ACUTE PT VISIT: 1 Visit        3:07 PM, 12/03/23 Haja Crego  [place], PT, DPT  with [facility]

## 2023-12-04 LAB — CULTURE, BLOOD (ROUTINE X 2)
Culture: NO GROWTH
Culture: NO GROWTH
Special Requests: ADEQUATE
Special Requests: ADEQUATE

## 2023-12-08 ENCOUNTER — Telehealth: Payer: Self-pay | Admitting: Medical Oncology

## 2023-12-08 NOTE — Telephone Encounter (Signed)
 Shona said Courtney Zuniga has elected to go on hospice services.

## 2023-12-09 ENCOUNTER — Ambulatory Visit: Admitting: Internal Medicine

## 2024-01-19 NOTE — Progress Notes (Deleted)
 Cardiology Office Note    Date:  01/19/2024  ID:  Courtney Zuniga, DOB [birth year], MRN [id number] Cardiologist: Vishnu P Mallipeddi, MD { :  History of Present Illness:    Courtney Zuniga is a 73 y.o. female with past medical history of chronic HFpEF, COPD, HTN and history of non-small cell lung cancer who presents to the office today for overdue annual follow-up.  She was last examined by Dr. Mallipeddi in 10/2022 and reported having palpitations since 2017 which is when she started treatment for her lung cancer.  Recent event monitor had shown predominantly sinus tachycardia with average heart rate of 107 bpm and PVC burden 1.2%.  She had recently been admitted to the hospital with a COPD and CHF exacerbation and reported improvement in symptoms.  She had been on metoprolol  and given her COPD, this was switched to bisoprolol  2.5 mg twice daily and she was continued on Lasix  20 mg as needed.  Most recent echocardiogram had shown mild to moderate AI and mild AS, therefore a follow-up echocardiogram was recommended in 1 year for further assessment.  In the interim, she was admitted to [facility] in 05/2023 for acute on chronic hypoxic respiratory failure in the setting of a COPD exacerbation and pneumonia.  She had a recurrent admission in 07/2023 for acute metabolic encephalopathy and acute hypoxic respiratory failure in the setting of COPD exacerbation, rhinovirus and community-acquired pneumonia.  Recurrent admission again in 09/2023 for acute on chronic [place] respiratory failure and SNF placement was recommended but she refused this along with home hospice care.  Recurrent admission in 10/2023 for similar episode with COPD exacerbation and pneumonia.  Most recent mission was in 11/2023 for severe sepsis in the setting of pneumonia and a COPD exacerbation.  She did have a repeat echocardiogram during that admission which showed a preserved EF of 60 to 65%, no regional wall motion abnormalities,  grade 1 diastolic dysfunction, moderately reduced RV function with severely enlarged RV, severely elevated PASP at 62.3 mmHg, mild to moderate TR, moderate AI and mild AAS.  ROS: ***  Studies Reviewed:   EKG: EKG is*** ordered today and demonstrates ***   EKG Interpretation Date/Time:    Ventricular Rate:    PR Interval:    QRS Duration:    QT Interval:    QTC Calculation:   R Axis:      Text Interpretation:         Event Monitor: 04/2022 Event monitor analysis time:6 days and 3 hours   Rhythm: Patient had a min HR of 76 bpm, max HR of 171 bpm, and avg HR of 107 bpm. Predominant underlying rhythm was Sinus Rhythm. AV block/Pauses: None Afib/flutter: None SVT:1 brief run of SVT occurred lasting 5 beats with a max rate of 171 bpm (avg 152 bpm). CU:Wnwz PAC:<1% PVC:1.2% Patient events: None   Conclusion: Normal sinus rhythm with no evidence of pauses or malignant atrial/ventricular arrhythmias. Elevated heart rates > 90 bpm consistently throughout the day. Average HR is high, 107 bpm. PVC burden is 1.2%.  Echocardiogram: 11/2023 IMPRESSIONS     1. Left ventricular ejection fraction, by estimation, is 60 to 65%. The  left ventricle has normal function. The left ventricle has no regional  wall motion abnormalities. Left ventricular diastolic parameters are  consistent with Grade I diastolic  dysfunction (impaired relaxation).   2. Right ventricular systolic function is moderately reduced. The right  ventricular size is severely enlarged. There is severely elevated  pulmonary artery systolic pressure.  The estimated right ventricular  systolic pressure is 62.3 mmHg.   3. Right atrial size was severely dilated.   4. The mitral valve is grossly normal. No evidence of mitral valve  regurgitation. No evidence of mitral stenosis.   5. Tricuspid valve regurgitation is mild to moderate.   6. The aortic valve has an indeterminant number of cusps. There is  moderate  calcification of the aortic valve. There is moderate thickening  of the aortic valve. Aortic valve regurgitation is moderate. Mild aortic  valve stenosis. Aortic regurgitation PHT   measures 243 msec. Aortic valve mean gradient measures 13.3 mmHg. Aortic  valve Vmax measures 2.58 m/s.   7. The inferior vena cava is dilated in size with <50% respiratory  variability, suggesting right atrial pressure of 15 mmHg.   Comparison(s): Changes from prior study are noted. RV function is  moderately reduced and RV size is severely enlarged.    Risk Assessment/Calculations:   {Does this patient have ATRIAL FIBRILLATION?:[phone]} No BP recorded.  {Refresh Note OR Click here to enter BP  :1}***         Physical Exam:   VS:  There were no vitals taken for this visit.   Wt Readings from Last 3 Encounters:  11/29/23 126 lb 1.7 oz (57.2 kg)  10/25/23 126 lb 1.7 oz (57.2 kg)  09/18/23 122 lb 2.2 oz (55.4 kg)     GEN: Well nourished, well developed in no acute distress NECK: No JVD; No carotid bruits CARDIAC: ***RRR, no murmurs, rubs, gallops RESPIRATORY:  Clear to auscultation without rales, wheezing or rhonchi  ABDOMEN: Appears non-distended. No obvious abdominal masses. EXTREMITIES: No clubbing or cyanosis. No edema.  Distal pedal pulses are 2+ bilaterally.   Assessment and Plan:      {Are you ordering a CV Procedure (e.g. stress test, cath, DCCV, TEE, etc)?   Press F2        :[id number]}   Signed, Laymon CHRISTELLA Qua, PA-C

## 2024-01-20 ENCOUNTER — Ambulatory Visit: Attending: Student | Admitting: Student

## 2024-01-27 ENCOUNTER — Other Ambulatory Visit: Payer: Self-pay

## 2024-02-13 DEATH — deceased

## 2024-02-18 ENCOUNTER — Encounter (INDEPENDENT_AMBULATORY_CARE_PROVIDER_SITE_OTHER): Payer: Self-pay | Admitting: Gastroenterology

## 2024-03-25 ENCOUNTER — Ambulatory Visit: Admitting: Student
